# Patient Record
Sex: Female | Born: 1945
Health system: Southern US, Community
[De-identification: ages and names within clinical notes are randomized; demographics above are authoritative.]

## PROBLEM LIST (undated history)

## (undated) DIAGNOSIS — N390 Urinary tract infection, site not specified: Secondary | ICD-10-CM

## (undated) DIAGNOSIS — G8929 Other chronic pain: Secondary | ICD-10-CM

## (undated) DIAGNOSIS — D649 Anemia, unspecified: Secondary | ICD-10-CM

## (undated) DIAGNOSIS — M542 Cervicalgia: Secondary | ICD-10-CM

## (undated) DIAGNOSIS — F32A Depression, unspecified: Secondary | ICD-10-CM

## (undated) DIAGNOSIS — I10 Essential (primary) hypertension: Secondary | ICD-10-CM

## (undated) DIAGNOSIS — K219 Gastro-esophageal reflux disease without esophagitis: Secondary | ICD-10-CM

## (undated) DIAGNOSIS — M549 Dorsalgia, unspecified: Secondary | ICD-10-CM

## (undated) DIAGNOSIS — Z9889 Other specified postprocedural states: Secondary | ICD-10-CM

## (undated) DIAGNOSIS — T7840XA Allergy, unspecified, initial encounter: Secondary | ICD-10-CM

## (undated) DIAGNOSIS — H269 Unspecified cataract: Secondary | ICD-10-CM

## (undated) DIAGNOSIS — M1990 Unspecified osteoarthritis, unspecified site: Secondary | ICD-10-CM

## (undated) DIAGNOSIS — G709 Myoneural disorder, unspecified: Secondary | ICD-10-CM

## (undated) DIAGNOSIS — Z9289 Personal history of other medical treatment: Secondary | ICD-10-CM

## (undated) DIAGNOSIS — M329 Systemic lupus erythematosus, unspecified: Secondary | ICD-10-CM

## (undated) DIAGNOSIS — Z8739 Personal history of other diseases of the musculoskeletal system and connective tissue: Secondary | ICD-10-CM

## (undated) DIAGNOSIS — F419 Anxiety disorder, unspecified: Secondary | ICD-10-CM

## (undated) DIAGNOSIS — K8309 Other cholangitis: Secondary | ICD-10-CM

## (undated) DIAGNOSIS — K2270 Barrett's esophagus without dysplasia: Secondary | ICD-10-CM

## (undated) DIAGNOSIS — G4700 Insomnia, unspecified: Secondary | ICD-10-CM

## (undated) DIAGNOSIS — F329 Major depressive disorder, single episode, unspecified: Secondary | ICD-10-CM

## (undated) HISTORY — DX: Other cholangitis: K83.09

## (undated) HISTORY — PX: ABDOMINAL HYSTERECTOMY: SHX81

## (undated) HISTORY — DX: Other chronic pain: G89.29

## (undated) HISTORY — DX: Essential (primary) hypertension: I10

## (undated) HISTORY — DX: Anxiety disorder, unspecified: F41.9

## (undated) HISTORY — PX: EYE SURGERY: SHX253

## (undated) HISTORY — DX: Unspecified osteoarthritis, unspecified site: M19.90

## (undated) HISTORY — DX: Insomnia, unspecified: G47.00

## (undated) HISTORY — DX: Depression, unspecified: F32.A

## (undated) HISTORY — PX: BACK SURGERY: SHX140

## (undated) HISTORY — DX: Cervicalgia: M54.2

## (undated) HISTORY — DX: Unspecified cataract: H26.9

## (undated) HISTORY — DX: Other chronic pain: M54.9

## (undated) HISTORY — PX: SPINE SURGERY: SHX786

## (undated) HISTORY — DX: Gastro-esophageal reflux disease without esophagitis: K21.9

## (undated) HISTORY — DX: Major depressive disorder, single episode, unspecified: F32.9

## (undated) HISTORY — PX: JOINT REPLACEMENT: SHX530

## (undated) HISTORY — DX: Allergy, unspecified, initial encounter: T78.40XA

## (undated) HISTORY — DX: Other specified postprocedural states: Z98.890

## (undated) HISTORY — PX: CHOLECYSTECTOMY: SHX55

---

## 2000-02-08 HISTORY — PX: OTHER SURGICAL HISTORY: SHX169

## 2002-12-05 ENCOUNTER — Ambulatory Visit (HOSPITAL_COMMUNITY): Admission: RE | Admit: 2002-12-05 | Discharge: 2002-12-05 | Payer: Self-pay | Admitting: Family Medicine

## 2003-03-14 ENCOUNTER — Encounter: Admission: RE | Admit: 2003-03-14 | Discharge: 2003-06-12 | Payer: Self-pay | Admitting: Anesthesiology

## 2003-06-01 ENCOUNTER — Emergency Department (HOSPITAL_COMMUNITY): Admission: EM | Admit: 2003-06-01 | Discharge: 2003-06-01 | Payer: Self-pay | Admitting: Emergency Medicine

## 2003-06-22 ENCOUNTER — Inpatient Hospital Stay (HOSPITAL_COMMUNITY): Admission: EM | Admit: 2003-06-22 | Discharge: 2003-06-26 | Payer: Self-pay | Admitting: Emergency Medicine

## 2003-07-09 DIAGNOSIS — Z9889 Other specified postprocedural states: Secondary | ICD-10-CM

## 2003-07-09 HISTORY — DX: Other specified postprocedural states: Z98.890

## 2003-07-15 ENCOUNTER — Encounter
Admission: RE | Admit: 2003-07-15 | Discharge: 2003-10-13 | Payer: Self-pay | Admitting: Physical Medicine and Rehabilitation

## 2003-07-30 ENCOUNTER — Ambulatory Visit (HOSPITAL_COMMUNITY): Admission: RE | Admit: 2003-07-30 | Discharge: 2003-07-30 | Payer: Self-pay | Admitting: Internal Medicine

## 2003-10-16 ENCOUNTER — Ambulatory Visit (HOSPITAL_COMMUNITY): Admission: RE | Admit: 2003-10-16 | Discharge: 2003-10-16 | Payer: Self-pay | Admitting: Internal Medicine

## 2003-11-03 ENCOUNTER — Encounter
Admission: RE | Admit: 2003-11-03 | Discharge: 2004-02-01 | Payer: Self-pay | Admitting: Physical Medicine and Rehabilitation

## 2003-11-04 ENCOUNTER — Ambulatory Visit: Payer: Self-pay | Admitting: Anesthesiology

## 2003-12-09 ENCOUNTER — Ambulatory Visit: Payer: Self-pay | Admitting: Psychiatry

## 2003-12-18 ENCOUNTER — Ambulatory Visit: Payer: Self-pay | Admitting: Psychiatry

## 2003-12-19 ENCOUNTER — Ambulatory Visit: Payer: Self-pay | Admitting: Family Medicine

## 2004-03-05 ENCOUNTER — Ambulatory Visit: Payer: Self-pay | Admitting: Family Medicine

## 2004-06-29 ENCOUNTER — Ambulatory Visit: Payer: Self-pay | Admitting: Family Medicine

## 2004-07-08 ENCOUNTER — Ambulatory Visit: Payer: Self-pay | Admitting: Family Medicine

## 2004-09-03 ENCOUNTER — Ambulatory Visit: Payer: Self-pay | Admitting: Family Medicine

## 2004-09-09 ENCOUNTER — Ambulatory Visit: Payer: Self-pay | Admitting: Family Medicine

## 2004-09-28 ENCOUNTER — Ambulatory Visit: Payer: Self-pay | Admitting: Family Medicine

## 2004-11-15 ENCOUNTER — Ambulatory Visit (HOSPITAL_COMMUNITY): Admission: RE | Admit: 2004-11-15 | Discharge: 2004-11-15 | Payer: Self-pay | Admitting: Family Medicine

## 2004-11-19 ENCOUNTER — Ambulatory Visit: Payer: Self-pay | Admitting: Family Medicine

## 2005-02-03 ENCOUNTER — Ambulatory Visit: Payer: Self-pay | Admitting: Family Medicine

## 2005-03-24 ENCOUNTER — Ambulatory Visit: Payer: Self-pay | Admitting: Family Medicine

## 2005-05-10 ENCOUNTER — Ambulatory Visit: Payer: Self-pay | Admitting: Family Medicine

## 2005-06-02 ENCOUNTER — Ambulatory Visit: Payer: Self-pay | Admitting: Family Medicine

## 2005-06-07 ENCOUNTER — Ambulatory Visit: Payer: Self-pay | Admitting: Family Medicine

## 2005-07-08 ENCOUNTER — Ambulatory Visit: Payer: Self-pay | Admitting: Family Medicine

## 2005-08-16 ENCOUNTER — Ambulatory Visit: Payer: Self-pay | Admitting: Family Medicine

## 2005-09-07 ENCOUNTER — Ambulatory Visit: Payer: Self-pay | Admitting: Family Medicine

## 2005-10-07 ENCOUNTER — Ambulatory Visit (HOSPITAL_COMMUNITY): Admission: RE | Admit: 2005-10-07 | Discharge: 2005-10-07 | Payer: Self-pay | Admitting: Family Medicine

## 2005-11-01 ENCOUNTER — Ambulatory Visit: Payer: Self-pay | Admitting: Family Medicine

## 2005-12-01 ENCOUNTER — Ambulatory Visit (HOSPITAL_COMMUNITY): Admission: RE | Admit: 2005-12-01 | Discharge: 2005-12-01 | Payer: Self-pay | Admitting: Family Medicine

## 2005-12-08 ENCOUNTER — Ambulatory Visit: Payer: Self-pay | Admitting: Family Medicine

## 2005-12-21 ENCOUNTER — Ambulatory Visit: Payer: Self-pay | Admitting: Family Medicine

## 2006-02-07 HISTORY — PX: SHOULDER ARTHROSCOPY: SHX128

## 2006-02-09 ENCOUNTER — Ambulatory Visit: Payer: Self-pay | Admitting: Family Medicine

## 2006-02-17 ENCOUNTER — Ambulatory Visit (HOSPITAL_COMMUNITY): Admission: RE | Admit: 2006-02-17 | Discharge: 2006-02-17 | Payer: Self-pay | Admitting: Family Medicine

## 2006-03-01 ENCOUNTER — Encounter: Payer: Self-pay | Admitting: Family Medicine

## 2006-03-01 LAB — CONVERTED CEMR LAB
BUN: 24 mg/dL — ABNORMAL HIGH (ref 6–23)
CO2: 29 meq/L (ref 19–32)
Calcium: 9.3 mg/dL (ref 8.4–10.5)
Eosinophils Absolute: 0.1 10*3/uL (ref 0.0–0.7)
Glucose, Bld: 86 mg/dL (ref 70–99)
Lymphs Abs: 1.9 10*3/uL (ref 0.7–3.3)
MCHC: 32.7 g/dL (ref 30.0–36.0)
MCV: 95.7 fL (ref 78.0–100.0)
Monocytes Absolute: 0.5 10*3/uL (ref 0.2–0.7)
Neutro Abs: 3.5 10*3/uL (ref 1.7–7.7)
Neutrophils Relative %: 59 % (ref 43–77)
RBC: 4.41 M/uL (ref 3.87–5.11)
WBC: 6 10*3/uL (ref 4.0–10.5)

## 2006-03-16 ENCOUNTER — Ambulatory Visit: Payer: Self-pay | Admitting: Family Medicine

## 2006-03-16 ENCOUNTER — Encounter: Payer: Self-pay | Admitting: Family Medicine

## 2006-03-16 ENCOUNTER — Other Ambulatory Visit: Admission: RE | Admit: 2006-03-16 | Discharge: 2006-03-16 | Payer: Self-pay | Admitting: Family Medicine

## 2006-03-16 LAB — CONVERTED CEMR LAB: Pap Smear: NORMAL

## 2006-03-21 ENCOUNTER — Ambulatory Visit (HOSPITAL_COMMUNITY): Admission: RE | Admit: 2006-03-21 | Discharge: 2006-03-21 | Payer: Self-pay | Admitting: Family Medicine

## 2006-03-24 ENCOUNTER — Encounter (HOSPITAL_COMMUNITY): Admission: RE | Admit: 2006-03-24 | Discharge: 2006-04-23 | Payer: Self-pay | Admitting: Family Medicine

## 2006-05-09 ENCOUNTER — Ambulatory Visit: Payer: Self-pay | Admitting: Family Medicine

## 2006-06-14 ENCOUNTER — Ambulatory Visit: Payer: Self-pay | Admitting: Family Medicine

## 2006-06-27 ENCOUNTER — Ambulatory Visit (HOSPITAL_COMMUNITY): Payer: Self-pay | Admitting: Psychology

## 2006-07-10 ENCOUNTER — Ambulatory Visit (HOSPITAL_COMMUNITY): Payer: Self-pay | Admitting: Psychology

## 2006-07-27 ENCOUNTER — Ambulatory Visit (HOSPITAL_BASED_OUTPATIENT_CLINIC_OR_DEPARTMENT_OTHER): Admission: RE | Admit: 2006-07-27 | Discharge: 2006-07-27 | Payer: Self-pay | Admitting: Orthopedic Surgery

## 2006-08-28 ENCOUNTER — Ambulatory Visit: Payer: Self-pay | Admitting: Family Medicine

## 2006-10-11 ENCOUNTER — Ambulatory Visit: Payer: Self-pay | Admitting: Family Medicine

## 2006-10-27 ENCOUNTER — Encounter: Payer: Self-pay | Admitting: Family Medicine

## 2006-10-27 LAB — CONVERTED CEMR LAB
BUN: 20 mg/dL (ref 6–23)
CO2: 24 meq/L (ref 19–32)
Calcium: 9.4 mg/dL (ref 8.4–10.5)
Cholesterol: 165 mg/dL (ref 0–200)
Glucose, Bld: 97 mg/dL (ref 70–99)
Potassium: 4.3 meq/L (ref 3.5–5.3)
Total CHOL/HDL Ratio: 3.2
Triglycerides: 86 mg/dL (ref <150)

## 2006-11-06 ENCOUNTER — Ambulatory Visit: Payer: Self-pay | Admitting: Family Medicine

## 2006-12-05 ENCOUNTER — Ambulatory Visit (HOSPITAL_COMMUNITY): Admission: RE | Admit: 2006-12-05 | Discharge: 2006-12-05 | Payer: Self-pay | Admitting: Family Medicine

## 2006-12-13 ENCOUNTER — Ambulatory Visit: Payer: Self-pay | Admitting: Family Medicine

## 2007-01-24 ENCOUNTER — Encounter: Payer: Self-pay | Admitting: Family Medicine

## 2007-04-12 ENCOUNTER — Ambulatory Visit: Payer: Self-pay | Admitting: Family Medicine

## 2007-05-21 ENCOUNTER — Ambulatory Visit: Payer: Self-pay | Admitting: Family Medicine

## 2007-06-22 DIAGNOSIS — M542 Cervicalgia: Secondary | ICD-10-CM | POA: Insufficient documentation

## 2007-06-22 DIAGNOSIS — F418 Other specified anxiety disorders: Secondary | ICD-10-CM | POA: Insufficient documentation

## 2007-06-22 DIAGNOSIS — G8929 Other chronic pain: Secondary | ICD-10-CM | POA: Insufficient documentation

## 2007-06-22 DIAGNOSIS — K219 Gastro-esophageal reflux disease without esophagitis: Secondary | ICD-10-CM | POA: Insufficient documentation

## 2007-06-22 DIAGNOSIS — F5105 Insomnia due to other mental disorder: Secondary | ICD-10-CM

## 2007-06-22 DIAGNOSIS — R0989 Other specified symptoms and signs involving the circulatory and respiratory systems: Secondary | ICD-10-CM | POA: Insufficient documentation

## 2007-06-22 DIAGNOSIS — M549 Dorsalgia, unspecified: Secondary | ICD-10-CM | POA: Insufficient documentation

## 2007-06-22 DIAGNOSIS — B349 Viral infection, unspecified: Secondary | ICD-10-CM | POA: Insufficient documentation

## 2007-06-22 DIAGNOSIS — I10 Essential (primary) hypertension: Secondary | ICD-10-CM | POA: Insufficient documentation

## 2007-06-22 DIAGNOSIS — F324 Major depressive disorder, single episode, in partial remission: Secondary | ICD-10-CM | POA: Insufficient documentation

## 2007-06-22 DIAGNOSIS — F32A Depression, unspecified: Secondary | ICD-10-CM | POA: Insufficient documentation

## 2007-06-22 DIAGNOSIS — B009 Herpesviral infection, unspecified: Secondary | ICD-10-CM | POA: Insufficient documentation

## 2007-06-22 DIAGNOSIS — M1990 Unspecified osteoarthritis, unspecified site: Secondary | ICD-10-CM | POA: Insufficient documentation

## 2007-07-31 ENCOUNTER — Encounter: Payer: Self-pay | Admitting: Family Medicine

## 2007-07-31 ENCOUNTER — Ambulatory Visit: Payer: Self-pay | Admitting: Family Medicine

## 2007-08-02 ENCOUNTER — Encounter: Payer: Self-pay | Admitting: Family Medicine

## 2007-08-02 LAB — CONVERTED CEMR LAB
Albumin: 4.2 g/dL (ref 3.5–5.2)
CO2: 22 meq/L (ref 19–32)
Calcium: 8.9 mg/dL (ref 8.4–10.5)
Chloride: 109 meq/L (ref 96–112)
Cholesterol: 133 mg/dL (ref 0–200)
Creatinine, Ser: 1.06 mg/dL (ref 0.40–1.20)
Eosinophils Relative: 1 % (ref 0–5)
HCT: 36 % (ref 36.0–46.0)
HDL: 38 mg/dL — ABNORMAL LOW (ref >39)
Hemoglobin: 11.9 g/dL — ABNORMAL LOW (ref 12.0–15.0)
LDL Cholesterol: 76 mg/dL (ref 0–99)
MCHC: 33.1 g/dL (ref 30.0–36.0)
MCV: 107.5 fL — ABNORMAL HIGH (ref 78.0–100.0)
Monocytes Absolute: 0.3 10*3/uL (ref 0.1–1.0)
Neutro Abs: 2.2 10*3/uL (ref 1.7–7.7)
Potassium: 3.9 meq/L (ref 3.5–5.3)
RBC: 3.35 M/uL — ABNORMAL LOW (ref 3.87–5.11)
Total CHOL/HDL Ratio: 3.5
Total Protein: 7.4 g/dL (ref 6.0–8.3)
Triglycerides: 97 mg/dL (ref <150)
VLDL: 19 mg/dL (ref 0–40)

## 2007-10-01 ENCOUNTER — Encounter: Payer: Self-pay | Admitting: Family Medicine

## 2007-10-08 ENCOUNTER — Encounter: Payer: Self-pay | Admitting: Family Medicine

## 2007-10-23 ENCOUNTER — Telehealth: Payer: Self-pay | Admitting: Family Medicine

## 2007-10-23 ENCOUNTER — Ambulatory Visit: Payer: Self-pay | Admitting: Family Medicine

## 2007-11-07 ENCOUNTER — Telehealth: Payer: Self-pay | Admitting: Family Medicine

## 2007-11-08 ENCOUNTER — Encounter: Payer: Self-pay | Admitting: Family Medicine

## 2007-11-13 ENCOUNTER — Ambulatory Visit: Payer: Self-pay | Admitting: Family Medicine

## 2007-11-14 ENCOUNTER — Encounter: Payer: Self-pay | Admitting: Family Medicine

## 2007-11-15 ENCOUNTER — Ambulatory Visit: Payer: Self-pay | Admitting: Internal Medicine

## 2007-11-16 ENCOUNTER — Ambulatory Visit (HOSPITAL_COMMUNITY): Admission: RE | Admit: 2007-11-16 | Discharge: 2007-11-16 | Payer: Self-pay | Admitting: Internal Medicine

## 2007-11-18 DIAGNOSIS — N760 Acute vaginitis: Secondary | ICD-10-CM

## 2007-11-29 ENCOUNTER — Ambulatory Visit: Payer: Self-pay | Admitting: Internal Medicine

## 2007-11-29 ENCOUNTER — Encounter: Payer: Self-pay | Admitting: Internal Medicine

## 2007-11-29 ENCOUNTER — Encounter: Payer: Self-pay | Admitting: Family Medicine

## 2007-11-29 ENCOUNTER — Ambulatory Visit (HOSPITAL_COMMUNITY): Admission: RE | Admit: 2007-11-29 | Discharge: 2007-11-29 | Payer: Self-pay | Admitting: Internal Medicine

## 2007-11-29 HISTORY — PX: ESOPHAGOGASTRODUODENOSCOPY: SHX1529

## 2007-12-06 ENCOUNTER — Ambulatory Visit (HOSPITAL_COMMUNITY): Admission: RE | Admit: 2007-12-06 | Discharge: 2007-12-06 | Payer: Self-pay | Admitting: Family Medicine

## 2007-12-10 ENCOUNTER — Encounter: Payer: Self-pay | Admitting: Family Medicine

## 2007-12-14 ENCOUNTER — Encounter: Payer: Self-pay | Admitting: Family Medicine

## 2007-12-17 ENCOUNTER — Ambulatory Visit (HOSPITAL_COMMUNITY): Admission: RE | Admit: 2007-12-17 | Discharge: 2007-12-17 | Payer: Self-pay | Admitting: Family Medicine

## 2007-12-24 ENCOUNTER — Telehealth: Payer: Self-pay | Admitting: Family Medicine

## 2007-12-26 ENCOUNTER — Ambulatory Visit: Payer: Self-pay | Admitting: Family Medicine

## 2007-12-31 ENCOUNTER — Telehealth: Payer: Self-pay | Admitting: Family Medicine

## 2008-01-23 ENCOUNTER — Ambulatory Visit: Payer: Self-pay | Admitting: Family Medicine

## 2008-03-05 ENCOUNTER — Telehealth: Payer: Self-pay | Admitting: Family Medicine

## 2008-03-06 ENCOUNTER — Ambulatory Visit: Payer: Self-pay | Admitting: Family Medicine

## 2008-03-24 ENCOUNTER — Encounter: Payer: Self-pay | Admitting: Family Medicine

## 2008-03-28 ENCOUNTER — Encounter: Payer: Self-pay | Admitting: Family Medicine

## 2008-04-29 ENCOUNTER — Encounter: Payer: Self-pay | Admitting: Family Medicine

## 2008-05-05 ENCOUNTER — Encounter: Payer: Self-pay | Admitting: Family Medicine

## 2008-05-08 ENCOUNTER — Encounter: Payer: Self-pay | Admitting: Family Medicine

## 2008-05-26 ENCOUNTER — Telehealth: Payer: Self-pay | Admitting: Family Medicine

## 2008-05-28 ENCOUNTER — Ambulatory Visit: Payer: Self-pay | Admitting: Family Medicine

## 2008-06-26 ENCOUNTER — Telehealth: Payer: Self-pay | Admitting: Family Medicine

## 2008-07-03 ENCOUNTER — Encounter: Payer: Self-pay | Admitting: Family Medicine

## 2008-07-21 ENCOUNTER — Telehealth: Payer: Self-pay | Admitting: Family Medicine

## 2008-08-08 ENCOUNTER — Encounter: Payer: Self-pay | Admitting: Family Medicine

## 2008-08-08 ENCOUNTER — Telehealth: Payer: Self-pay | Admitting: Family Medicine

## 2008-08-08 DIAGNOSIS — R5381 Other malaise: Secondary | ICD-10-CM | POA: Insufficient documentation

## 2008-08-08 DIAGNOSIS — R5383 Other fatigue: Secondary | ICD-10-CM

## 2008-08-14 ENCOUNTER — Encounter: Payer: Self-pay | Admitting: Family Medicine

## 2008-08-14 LAB — CONVERTED CEMR LAB
Basophils Absolute: 0 10*3/uL (ref 0.0–0.1)
CO2: 22 meq/L (ref 19–32)
Calcium: 9.1 mg/dL (ref 8.4–10.5)
Chloride: 109 meq/L (ref 96–112)
Cholesterol: 116 mg/dL (ref 0–200)
Eosinophils Relative: 3 % (ref 0–5)
HCT: 33.9 % — ABNORMAL LOW (ref 36.0–46.0)
Lymphs Abs: 1.3 10*3/uL (ref 0.7–4.0)
MCV: 105 fL — ABNORMAL HIGH (ref 78.0–100.0)
Monocytes Absolute: 0.6 10*3/uL (ref 0.1–1.0)
Neutrophils Relative %: 42 % — ABNORMAL LOW (ref 43–77)
Potassium: 4.7 meq/L (ref 3.5–5.3)
RBC: 3.23 M/uL — ABNORMAL LOW (ref 3.87–5.11)
RDW: 13.6 % (ref 11.5–15.5)
TSH: 3.795 microintl units/mL (ref 0.350–4.500)
Total CHOL/HDL Ratio: 2.9
VLDL: 22 mg/dL (ref 0–40)

## 2008-08-19 ENCOUNTER — Ambulatory Visit: Payer: Self-pay | Admitting: Family Medicine

## 2008-08-19 ENCOUNTER — Other Ambulatory Visit: Admission: RE | Admit: 2008-08-19 | Discharge: 2008-08-19 | Payer: Self-pay | Admitting: Family Medicine

## 2008-08-19 ENCOUNTER — Encounter: Payer: Self-pay | Admitting: Family Medicine

## 2008-08-19 DIAGNOSIS — N3000 Acute cystitis without hematuria: Secondary | ICD-10-CM | POA: Insufficient documentation

## 2008-08-19 LAB — CONVERTED CEMR LAB
Bilirubin Urine: NEGATIVE
Blood in Urine, dipstick: NEGATIVE
Ketones, urine, test strip: NEGATIVE
OCCULT 1: NEGATIVE
Specific Gravity, Urine: 1.02

## 2008-08-20 ENCOUNTER — Encounter: Payer: Self-pay | Admitting: Family Medicine

## 2008-08-20 LAB — CONVERTED CEMR LAB
Candida species: NEGATIVE
Chlamydia, DNA Probe: NEGATIVE

## 2008-09-12 ENCOUNTER — Encounter: Payer: Self-pay | Admitting: Family Medicine

## 2008-10-14 ENCOUNTER — Encounter: Payer: Self-pay | Admitting: Family Medicine

## 2008-11-10 ENCOUNTER — Telehealth: Payer: Self-pay | Admitting: Family Medicine

## 2008-11-11 ENCOUNTER — Encounter: Payer: Self-pay | Admitting: Family Medicine

## 2008-11-25 ENCOUNTER — Telehealth: Payer: Self-pay | Admitting: Family Medicine

## 2008-12-19 ENCOUNTER — Ambulatory Visit (HOSPITAL_COMMUNITY): Admission: RE | Admit: 2008-12-19 | Discharge: 2008-12-19 | Payer: Self-pay | Admitting: Family Medicine

## 2009-01-06 ENCOUNTER — Telehealth: Payer: Self-pay | Admitting: Family Medicine

## 2009-01-07 ENCOUNTER — Ambulatory Visit: Payer: Self-pay | Admitting: Family Medicine

## 2009-01-07 DIAGNOSIS — L0390 Cellulitis, unspecified: Secondary | ICD-10-CM

## 2009-01-07 DIAGNOSIS — L0291 Cutaneous abscess, unspecified: Secondary | ICD-10-CM | POA: Insufficient documentation

## 2009-01-08 ENCOUNTER — Telehealth: Payer: Self-pay | Admitting: Family Medicine

## 2009-01-14 ENCOUNTER — Encounter: Payer: Self-pay | Admitting: Family Medicine

## 2009-01-18 DIAGNOSIS — T7840XA Allergy, unspecified, initial encounter: Secondary | ICD-10-CM | POA: Insufficient documentation

## 2009-01-19 ENCOUNTER — Telehealth: Payer: Self-pay | Admitting: Family Medicine

## 2009-02-07 HISTORY — PX: WRIST SURGERY: SHX841

## 2009-02-18 ENCOUNTER — Telehealth: Payer: Self-pay | Admitting: Family Medicine

## 2009-02-24 ENCOUNTER — Telehealth: Payer: Self-pay | Admitting: Family Medicine

## 2009-04-15 ENCOUNTER — Telehealth: Payer: Self-pay | Admitting: Family Medicine

## 2009-04-16 ENCOUNTER — Ambulatory Visit: Payer: Self-pay | Admitting: Family Medicine

## 2009-04-21 ENCOUNTER — Ambulatory Visit: Payer: Self-pay | Admitting: Family Medicine

## 2009-05-19 ENCOUNTER — Telehealth: Payer: Self-pay | Admitting: Family Medicine

## 2009-05-20 ENCOUNTER — Telehealth: Payer: Self-pay | Admitting: Family Medicine

## 2009-05-25 ENCOUNTER — Telehealth (INDEPENDENT_AMBULATORY_CARE_PROVIDER_SITE_OTHER): Payer: Self-pay | Admitting: *Deleted

## 2009-05-26 ENCOUNTER — Ambulatory Visit: Payer: Self-pay | Admitting: Family Medicine

## 2009-05-26 ENCOUNTER — Ambulatory Visit (HOSPITAL_COMMUNITY): Admission: RE | Admit: 2009-05-26 | Discharge: 2009-05-26 | Payer: Self-pay | Admitting: Family Medicine

## 2009-05-27 ENCOUNTER — Encounter: Payer: Self-pay | Admitting: Family Medicine

## 2009-05-27 ENCOUNTER — Telehealth: Payer: Self-pay | Admitting: Family Medicine

## 2009-05-27 LAB — CONVERTED CEMR LAB
Alkaline Phosphatase: 93 units/L (ref 39–117)
BUN: 27 mg/dL — ABNORMAL HIGH (ref 6–23)
Basophils Relative: 0 % (ref 0–1)
CO2: 26 meq/L (ref 19–32)
Chloride: 102 meq/L (ref 96–112)
Eosinophils Relative: 1 % (ref 0–5)
HDL: 49 mg/dL (ref >39)
Hemoglobin: 11.4 g/dL — ABNORMAL LOW (ref 12.0–15.0)
Ketones, ur: NEGATIVE mg/dL
Lymphocytes Relative: 37 % (ref 12–46)
Lymphs Abs: 1 10*3/uL (ref 0.7–4.0)
MCHC: 33.5 g/dL (ref 30.0–36.0)
Monocytes Relative: 11 % (ref 3–12)
Neutro Abs: 1.4 10*3/uL — ABNORMAL LOW (ref 1.7–7.7)
Nitrite: NEGATIVE
Protein, ur: NEGATIVE mg/dL
RBC: 3.25 M/uL — ABNORMAL LOW (ref 3.87–5.11)
Retic Ct Pct: 0.7 % (ref 0.4–3.1)
Total Bilirubin: 0.5 mg/dL (ref 0.3–1.2)
Total CHOL/HDL Ratio: 2.7
Total Protein: 7.9 g/dL (ref 6.0–8.3)
Triglycerides: 102 mg/dL (ref <150)
VLDL: 20 mg/dL (ref 0–40)
Vit D, 25-Hydroxy: 31 ng/mL (ref 30–89)
WBC: 2.6 10*3/uL — ABNORMAL LOW (ref 4.0–10.5)
pH: 7 (ref 5.0–8.0)

## 2009-06-01 ENCOUNTER — Telehealth: Payer: Self-pay | Admitting: Family Medicine

## 2009-06-08 ENCOUNTER — Telehealth: Payer: Self-pay | Admitting: Family Medicine

## 2009-06-22 ENCOUNTER — Telehealth: Payer: Self-pay | Admitting: Family Medicine

## 2009-06-25 ENCOUNTER — Encounter: Payer: Self-pay | Admitting: Family Medicine

## 2009-07-14 ENCOUNTER — Telehealth: Payer: Self-pay | Admitting: Family Medicine

## 2009-07-28 ENCOUNTER — Encounter: Payer: Self-pay | Admitting: Family Medicine

## 2009-08-13 ENCOUNTER — Telehealth: Payer: Self-pay | Admitting: Family Medicine

## 2009-08-17 ENCOUNTER — Telehealth: Payer: Self-pay | Admitting: Family Medicine

## 2009-09-23 ENCOUNTER — Ambulatory Visit: Payer: Self-pay | Admitting: Family Medicine

## 2009-10-14 ENCOUNTER — Telehealth: Payer: Self-pay | Admitting: Family Medicine

## 2009-10-23 ENCOUNTER — Telehealth: Payer: Self-pay | Admitting: Family Medicine

## 2009-10-28 ENCOUNTER — Encounter: Payer: Self-pay | Admitting: Family Medicine

## 2009-10-28 ENCOUNTER — Telehealth: Payer: Self-pay | Admitting: Family Medicine

## 2009-11-24 ENCOUNTER — Ambulatory Visit: Payer: Self-pay | Admitting: Family Medicine

## 2009-11-24 DIAGNOSIS — R1900 Intra-abdominal and pelvic swelling, mass and lump, unspecified site: Secondary | ICD-10-CM

## 2009-11-24 DIAGNOSIS — R1903 Right lower quadrant abdominal swelling, mass and lump: Secondary | ICD-10-CM | POA: Insufficient documentation

## 2009-12-01 ENCOUNTER — Encounter: Payer: Self-pay | Admitting: Family Medicine

## 2009-12-02 LAB — CONVERTED CEMR LAB
CO2: 28 meq/L (ref 19–32)
Calcium: 9.3 mg/dL (ref 8.4–10.5)
Creatinine, Ser: 0.87 mg/dL (ref 0.40–1.20)
Glucose, Bld: 86 mg/dL (ref 70–99)
Potassium: 4.1 meq/L (ref 3.5–5.3)
Sodium: 138 meq/L (ref 135–145)

## 2009-12-03 ENCOUNTER — Ambulatory Visit (HOSPITAL_COMMUNITY): Admission: RE | Admit: 2009-12-03 | Discharge: 2009-12-03 | Payer: Self-pay | Admitting: Family Medicine

## 2009-12-03 ENCOUNTER — Telehealth: Payer: Self-pay | Admitting: Family Medicine

## 2009-12-25 ENCOUNTER — Ambulatory Visit (HOSPITAL_COMMUNITY): Admission: RE | Admit: 2009-12-25 | Discharge: 2009-12-25 | Payer: Self-pay | Admitting: Family Medicine

## 2010-01-05 ENCOUNTER — Telehealth: Payer: Self-pay | Admitting: Family Medicine

## 2010-01-06 ENCOUNTER — Telehealth: Payer: Self-pay | Admitting: Family Medicine

## 2010-02-24 ENCOUNTER — Ambulatory Visit
Admission: RE | Admit: 2010-02-24 | Discharge: 2010-02-24 | Payer: Self-pay | Source: Home / Self Care | Attending: Family Medicine | Admitting: Family Medicine

## 2010-02-27 ENCOUNTER — Encounter: Payer: Self-pay | Admitting: Family Medicine

## 2010-02-28 ENCOUNTER — Encounter: Payer: Self-pay | Admitting: Family Medicine

## 2010-03-09 NOTE — Assessment & Plan Note (Signed)
Summary: inj  Nurse Visit   Vital Signs:  Patient profile:   65 year old female Menstrual status:  hysterectomy Height:      65.5 inches Weight:      134.75 pounds BMI:     22.16 O2 Sat:      100 % on Room air Pulse rate:   91 / minute Resp:     16 per minute BP sitting:   180 / 100  (left arm)  Vitals Entered By: Adella Hare LPN (April 16, 2009 3:50 PM)  O2 Flow:  Room air Comments patient in for pain injections depo medrol and toradol per dr Lodema Hong   Allergies: No Known Drug Allergies  Medication Administration  Injection # 1:    Medication: Depo- Medrol 80mg     Diagnosis: BACK PAIN, CHRONIC (ICD-724.5)    Route: IM    Site: RUOQ gluteus    Exp Date: 11/11    Lot #: [id number]    Mfr: Pharmacia    Patient tolerated injection without complications    Given by: Adella Hare LPN (April 16, 2009 3:52 PM)  Injection # 2:    Medication: Ketorolac-Toradol 15mg     Diagnosis: BACK PAIN, CHRONIC (ICD-724.5)    Route: IM    Site: LUOQ gluteus    Exp Date: 09/08/2010    Lot #: [id number]    Mfr: novaplus    Comments: toradol 60mg  given    Patient tolerated injection without complications    Given by: Adella Hare LPN (April 16, 2009 3:52 PM)  Orders Added: 1)  Depo- Medrol 80mg  [J1040] 2)  Ketorolac-Toradol 15mg  [J1885] 3)  Admin of Therapeutic Inj  intramuscular or subcutaneous [96372] Pt to get d80 and t60 per dr. Lodema Hong for pain  Medication Administration  Injection # 1:    Medication: Depo- Medrol 80mg     Diagnosis: BACK PAIN, CHRONIC (ICD-724.5)    Route: IM    Site: RUOQ gluteus    Exp Date: 11/11    Lot #: [id number]    Mfr: Pharmacia    Patient tolerated injection without complications    Given by: Adella Hare LPN (April 16, 2009 3:52 PM)  Injection # 2:    Medication: Ketorolac-Toradol 15mg     Diagnosis: BACK PAIN, CHRONIC (ICD-724.5)    Route: IM    Site: LUOQ gluteus    Exp Date: 09/08/2010    Lot #: [id number]    Mfr: novaplus    Comments: toradol  60mg  given    Patient tolerated injection without complications    Given by: Adella Hare LPN (April 16, 2009 3:52 PM)  Orders Added: 1)  Depo- Medrol 80mg  [J1040] 2)  Ketorolac-Toradol 15mg  [J1885] 3)  Admin of Therapeutic Inj  intramuscular or subcutaneous [96372]   Appended Document: inj pt needs MD visit in the next week to evaluate HTN , pls let her know this and schedule appt  also she needs to bring her meds for review  Appended Document: inj COMING IN TOMORROW

## 2010-03-09 NOTE — Progress Notes (Signed)
Summary: CALL  Phone Note Call from Patient   Summary of Call: WANTS YOU TO CALL HER  ABOUT SOME SHOTS SHE RECIEVED CALL BACK AT [phone] Initial call taken by: Lind Guest,  Jun 08, 2009 1:36 PM  Follow-up for Phone Call        patient had billing question about shots given, advised they were entered as they always are, nothing has changed on our end, to call pro fee billing with any questions Follow-up by: Adella Hare LPN,  Jun 09, 8411 3:25 PM

## 2010-03-09 NOTE — Progress Notes (Signed)
Summary: speak with nurse  Phone Note Call from Patient   Summary of Call: pt needs to speak with nurse about hydroc. and leaving town. [phone] Initial call taken by: Rudene Anda,  October 23, 2009 10:46 AM  Follow-up for Phone Call        patient needs hard copy of hydrocodone, going out of town Follow-up by: Adella Hare LPN,  October 23, 2009 10:52 AM  Additional Follow-up for Phone Call Additional follow up Details #1::        she should have just collected 1 month supply, how long will she be gone, and these are generally able to be transfered bnetween states as long as she stays with thesame pharmacy. More info needed pls Additional Follow-up by: Syliva Overman MD,  October 25, 2009 8:34 PM    Additional Follow-up for Phone Call Additional follow up Details #2::    called patient, left message Follow-up by: Adella Hare LPN,  October 26, 2009 9:28 AM  Additional Follow-up for Phone Call Additional follow up Details #3:: Details for Additional Follow-up Action Taken: called patient, she has already left on her trip to [place] Additional Follow-up by: Adella Hare LPN,  October 27, 2009 8:27 AM

## 2010-03-09 NOTE — Medication Information (Signed)
Summary: Tax adviser   Imported By: Lind Guest 10/29/2009 15:51:15  _____________________________________________________________________  External Attachment:    Type:   Image     Comment:   External Document

## 2010-03-09 NOTE — Progress Notes (Signed)
Summary: MEDS  Phone Note Call from Patient   Summary of Call: NEEDS YOU TO CALL BACK ABOUT HER MEDS Initial call taken by: Lind Guest,  August 13, 2009 3:55 PM  Follow-up for Phone Call        patient wants lower dose of vicodin maybe 10/325? or even somthing that she doesnt have to come sign for Follow-up by: Adella Hare LPN,  August 14, 1608 2:53 PM  Additional Follow-up for Phone Call Additional follow up Details #1::        pls let her know the fentanyl patch is what she is signing for , however , since she has had her surgery she may not need oit and i an happy to stop it , the pain tab does not need a signature to collect Additional Follow-up by: Syliva Overman MD,  August 16, 2009 7:30 PM    Additional Follow-up for Phone Call Additional follow up Details #2::    can i refill the hydrocodone Follow-up by: Adella Hare LPN,  August 17, 2009 11:15 AM  Additional Follow-up for Phone Call Additional follow up Details #3:: Details for Additional Follow-up Action Taken: refill x 2 let her know she needs ov in sept pls Additional Follow-up by: Syliva Overman MD,  August 17, 2009 12:27 PM  Prescriptions: HYDROCODONE-ACETAMINOPHEN 10-660 MG TABS (HYDROCODONE-ACETAMINOPHEN) ONE TAB by mouth QID  #120 x 1   Entered by:   Adella Hare LPN   Authorized by:   Syliva Overman MD   Signed by:   Adella Hare LPN on 96/05/5407   Method used:   Printed then faxed to ...       [facility]  [address] [address] [id number]       [address]       [place], [place]  [address]       Ph: [phone] or [phone]       Fax: [phone]   RxID:   [id number]  rx sent in, patient has appt in aug

## 2010-03-09 NOTE — Progress Notes (Signed)
  Phone Note Call from Patient   Caller: Patient Summary of Call: patient wants doc to call her, states hernia was on left, does she have two hernia, has alot of questions Initial call taken by: Adella Hare LPN,  December 03, 2009 3:17 PM  Follow-up for Phone Call        explained the report again she has concerns that her hernia was on theleft, no mention made of left hernia in rept i explained, she will consider surgical eval at some time , not now Follow-up by: Syliva Overman MD,  December 03, 2009 5:07 PM

## 2010-03-09 NOTE — Progress Notes (Signed)
Summary: get muscle relaxer  Phone Note Call from Patient   Summary of Call: [facility] [place]. pain in knee and legs. [phone] Initial call taken by: Rudene Anda,  October 14, 2009 3:52 PM  Follow-up for Phone Call        cycobenzaprine printedpls advise her and fax to the pharmacy of her choice, also flu vac available Follow-up by: Syliva Overman MD,  October 14, 2009 4:55 PM  Additional Follow-up for Phone Call Additional follow up Details #1::        Phone Call Completed, Rx Called In Additional Follow-up by: Adella Hare LPN,  October 14, 2009 5:09 PM    New/Updated Medications: CYCLOBENZAPRINE HCL 10 MG TABS (CYCLOBENZAPRINE HCL) Take 1 tablet by mouth two times a day as needed Prescriptions: CYCLOBENZAPRINE HCL 10 MG TABS (CYCLOBENZAPRINE HCL) Take 1 tablet by mouth two times a day as needed  #60 x 3   Entered and Authorized by:   Syliva Overman MD   Signed by:   Syliva Overman MD on 10/14/2009   Method used:   Printed then faxed to ...       [facility]  [address] [address] [id number]       [address]       [place], [place]  [address]       Ph: [phone] or [phone]       Fax: [phone]   RxID:   [id number]

## 2010-03-09 NOTE — Miscellaneous (Signed)
Summary: Orders Update  Clinical Lists Changes  Orders: Added new Test order of T-Basic Metabolic Panel ([id number]) - Signed  

## 2010-03-09 NOTE — Progress Notes (Signed)
Summary: INJECTION  Phone Note Call from Patient   Summary of Call: WANTS TO KNOW CAN SHE COME IN FOR A INJECTION FOR PREDISONE  FOR PAIN IN HIP  AND A RX FOR PREDISONE OR FENT. PATCH Initial call taken by: Lind Guest,  April 15, 2009 4:04 PM  Follow-up for Phone Call        She only wants a shot. Told her she can come in at 4pm today for shot of prednisone per Dr. Lodema Hong Follow-up by: Everitt Amber LPN,  April 16, 2009 10:32 AM

## 2010-03-09 NOTE — Progress Notes (Signed)
Summary: CALL  Phone Note Call from Patient   Summary of Call: Renee Waters cancelled this morning because she can not afford three drs. and asked me would this be free i said i would not think so BUT TO TELL YOU THAT HER MEDICINE IS WORKING Initial call taken by: Lind Guest,  May 19, 2009 9:02 AM

## 2010-03-09 NOTE — Progress Notes (Signed)
Summary: message  Phone Note Call from Patient   Summary of Call: said you called her friday to see how she was doing  and to let you and dr. Lodema Hong know that she has gone no where if any ? call her back Initial call taken by: Lind Guest,  Jun 22, 2009 2:47 PM  Follow-up for Phone Call        pls call and directly spk with her the call is regarding her surgery, just checking to see if recvering well Follow-up by: Syliva Overman MD,  Jun 22, 2009 4:25 PM  Additional Follow-up for Phone Call Additional follow up Details #1::        patient hasnt had the surgery per husband Additional Follow-up by: Adella Hare LPN,  Jun 23, 2009 11:34 AM    Additional Follow-up for Phone Call Additional follow up Details #2::    noted Follow-up by: Syliva Overman MD,  Jun 23, 2009 2:51 PM

## 2010-03-09 NOTE — Assessment & Plan Note (Signed)
Summary: f up   Vital Signs:  Patient profile:   65 year old female Menstrual status:  hysterectomy Height:      65 inches Weight:      124.75 pounds BMI:     20.83 O2 Sat:      94 % Pulse rate:   73 / minute Pulse rhythm:   regular Resp:     16 per minute BP sitting:   158 / 90  (left arm) Cuff size:   regular  Vitals Entered By: Everitt Amber LPN (September 23, 2009 1:02 PM) CC: Follow up chronic problems, wants pain meds changed and muscle relaxer.    Primary Care Provider:  Syliva Overman MD  CC:  Follow up chronic problems and wants pain meds changed and muscle relaxer. Marland Kitchen  History of Present Illness: Hospitalised June 14 to 22 for right hip replacement, there were reportedly some post op complications, however at [facility] time Renee Waters is satisfied with her  Reports  that she has optherwise not been doing well as far as her mntal health is concerned. Denies recent fever or chills. Denies sinus pressure, nasal congestion , ear pain or sore throat. Denies chest congestion, or cough productive of sputum. Denies chest pain, palpitations, PND, orthopnea or leg swelling. Denies abdominal pain, nausea, vomitting, diarrhea or constipation. Denies change in bowel movements or bloody stool. Denies dysuria , frequency, incontinence or hesitancy.  Denies headaches, vertigo, seizures.  Denies  rash, lesions, or itch.     Current Medications (verified): 1)  Gabapentin 300 Mg  Caps (Gabapentin) .... Take 1 Tablet By Mouth Three Times A Day 2)  Multivitamins   Tabs (Multiple Vitamin) .... Take 1 Tablet By Mouth Once A Day 3)  Temazepam 30 Mg  Caps (Temazepam) .... Take 1 Tab By Mouth At Bedtime 4)  Skelaxin 800 Mg Tabs (Metaxalone) .... Take 1 Tablet By Mouth Three Times A Day 5)  Hydrocodone-Acetaminophen 10-660 Mg Tabs (Hydrocodone-Acetaminophen) .... One Tab By Mouth Qid 6)  Alprazolam 0.5 Mg Tabs (Alprazolam) .... Take 1 Tab By Mouth At Bedtime 7)  Citalopram Hydrobromide 20 Mg Tabs  (Citalopram Hydrobromide) .... Take 1 Tablet By Mouth Once A Day 8)  Red Yeast Rice 600 Mg Caps (Red Yeast Rice Extract) .... Take 1 Tablet By Mouth Two Times A Day 9)  Q-10 Co-Enzyme 30 Mg Caps (Coenzyme Q10) .... Take 1 Tablet By Mouth Once A Day 10)  Premarin 0.625 Mg/gm Crea (Estrogens, Conjugated) .... Apply Sparingly Three Times Weekly Prn 11)  Clonidine Hcl 0.3 Mg Tabs (Clonidine Hcl) .... One Half in The Morning and One At Bedtime 12)  Ferrous Sulfate 325 (65 Fe) Mg Tabs (Ferrous Sulfate) .... Take 1 Tablet By Mouth Two Times A Day 13)  Aciphex 20 Mg Tbec (Rabeprazole Sodium) .... Take 1 Tablet By Mouth Once A Day  Allergies (verified): No Known Drug Allergies  Past History:  Past medical, surgical, family and social histories (including risk factors) reviewed, and no changes noted (except as noted below).  Past Medical History: Reviewed history from 01/24/2007 and no changes required.  INSOMNIA (ICD-780.52) BACK PAIN, CHRONIC (ICD-724.5) NECK PAIN, CHRONIC (ICD-723.1) DEPRESSION (ICD-311) GERD (ICD-530.81) OSTEOARTHRITIS, SEVERE (ICD-715.90) GENITAL HERPES (ICD-054.10) HYPERTENSION (ICD-401.9)  Past Surgical History: cervicle diskectomy 2002 total hysterectomy 1995 arthroscopy left shoulder 2008 Right hip replacement July 21, 2009  Family History: Reviewed history from 01/24/2007 and no changes required. MOTHER  78 HTN FATHER UNKNOWN SISTER  X  1 BROTHERS  0  Social  History: Reviewed history from 01/24/2007 and no changes required. EMPLOYED Married Former Smoker Alcohol use-no Drug use-no 4 CHILDREN  Review of Systems      See HPI General:  Complains of fatigue and sleep disorder. Eyes:  Denies discharge and red eye. Renee:  Complains of joint pain, low back pain, muscle aches, and thoracic pain. Psych:  Complains of depression, easily tearful, irritability, and mental problems; denies suicidal thoughts/plans, thoughts of violence, and unusual visions or  sounds; still experiencing significant depression asshe mourns the loss of her mother 7 yrs ago, has guilt around the fact that she was not physicLLY beside her when she died , aND BECAUSE SHE HAD SAID TO HER THAT SHE WAS TIRED .  Physical Exam  General:  Well-developed,well-nourished,in no acute distress; alert,appropriate and cooperative throughout examination HEENT: No facial asymmetry,  EOMI, No sinus tenderness, TM's Clear, oropharynx  pink and moist.   Chest: Clear to auscultation bilaterally.  CVS: S1, S2, No murmurs, No S3.   Abd: Soft, Nontender.  MSdecreased  ROM spine,and  in  hips,adequate in  shoulders and knees.  Ext: No edema.   CNS: CN 2-12 intact, power tone and sensation normal throughout.   Skin: Intact, no visible lesions or rashes.  Psych: Good eye contact, Tearful.  Memory intact, depressed appearing.    Impression & Recommendations:  Problem # 1:  BACK PAIN, CHRONIC (ICD-724.5) Assessment Unchanged  The following medications were removed from the medication list:    Hydrocodone-acetaminophen 10-660 Mg Tabs (Hydrocodone-acetaminophen) ..... One tab by mouth qid    Fentanyl 25 Mcg/hr Pt72 (Fentanyl) .Marland Kitchen... Apply one patch every 3 days Her updated medication list for this problem includes:    Skelaxin 800 Mg Tabs (Metaxalone) .Marland Kitchen... Take 1 tablet by mouth three times a day    Hydrocodone-acetaminophen 10-325 Mg Tabs (Hydrocodone-acetaminophen) .Marland Kitchen... Take 1 tablet by mouth four times a day  Problem # 2:  HYPERTENSION (ICD-401.9) Assessment: Deteriorated  The following medications were removed from the medication list:    Benicar Hct 40-25 Mg Tabs (Olmesartan medoxomil-hctz) .Marland Kitchen... Take 1 tablet by mouth once a day    Clonidine Hcl 0.3 Mg Tabs (Clonidine hcl) ..... One half in the morning and one at bedtime Her updated medication list for this problem includes:    Clonidine Hcl 0.3 Mg Tabs (Clonidine hcl) ..... One tablet twice daily  BP today: 158/90 Prior BP:  104/70 (05/26/2009)  Labs Reviewed: K+: 4.0 (05/26/2009) Creat: : 0.91 (05/26/2009)   Chol: 134 (05/26/2009)   HDL: 49 (05/26/2009)   LDL: 65 (05/26/2009)   TG: 102 (05/26/2009)  Problem # 3:  DEPRESSION (ICD-311) Assessment: Deteriorated  Her updated medication list for this problem includes:    Alprazolam 0.5 Mg Tabs (Alprazolam) .Marland Kitchen... Take 1 tab by mouth at bedtime    Citalopram Hydrobromide 20 Mg Tabs (Citalopram hydrobromide) .Marland Kitchen... Take 1 tablet by mouth once a day  Problem # 4:  INSOMNIA (ICD-780.52) Assessment: Improved  Her updated medication list for this problem includes:    Temazepam 30 Mg Caps (Temazepam) .Marland Kitchen... Take 1 tab by mouth at bedtime  Discussed sleep hygiene.   Complete Medication List: 1)  Gabapentin 300 Mg Caps (Gabapentin) .... Take 1 tablet by mouth three times a day 2)  Multivitamins Tabs (Multiple vitamin) .... Take 1 tablet by mouth once a day 3)  Temazepam 30 Mg Caps (Temazepam) .... Take 1 tab by mouth at bedtime 4)  Skelaxin 800 Mg Tabs (Metaxalone) .... Take 1 tablet by  mouth three times a day 5)  Alprazolam 0.5 Mg Tabs (Alprazolam) .... Take 1 tab by mouth at bedtime 6)  Citalopram Hydrobromide 20 Mg Tabs (Citalopram hydrobromide) .... Take 1 tablet by mouth once a day 7)  Red Yeast Rice 600 Mg Caps (Red yeast rice extract) .... Take 1 tablet by mouth two times a day 8)  Q-10 Co-enzyme 30 Mg Caps (Coenzyme q10) .... Take 1 tablet by mouth once a day 9)  Premarin 0.625 Mg/gm Crea (Estrogens, conjugated) .... Apply sparingly three times weekly prn 10)  Ferrous Sulfate 325 (65 Fe) Mg Tabs (Ferrous sulfate) .... Take 1 tablet by mouth two times a day 11)  Aciphex 20 Mg Tbec (Rabeprazole sodium) .... Take 1 tablet by mouth once a day 12)  Clonidine Hcl 0.3 Mg Tabs (Clonidine hcl) .... One tablet twice daily 13)  Hydrocodone-acetaminophen 10-325 Mg Tabs (Hydrocodone-acetaminophen) .... Take 1 tablet by mouth four times a day  Patient Instructions: 1)   Please schedule a follow-up appointment in 2 months. 2)  New dose of clonidine , increased to one twice daily, your BP is too high. 3)  Stop the benicar as you have done Prescriptions: CITALOPRAM HYDROBROMIDE 20 MG TABS (CITALOPRAM HYDROBROMIDE) Take 1 tablet by mouth once a day  #90 x 1   Entered by:   Everitt Amber LPN   Authorized by:   Syliva Overman MD   Signed by:   Everitt Amber LPN on 16/11/9602   Method used:   Printed then faxed to ...       [facility] [address] [id number]       [address]       [place], [place]  [address]       Ph: [phone] or [phone]       Fax: [phone]   RxID:   [id number] ALPRAZOLAM 0.5 MG TABS (ALPRAZOLAM) Take 1 tab by mouth at bedtime  #30 x 3   Entered by:   Everitt Amber LPN   Authorized by:   Syliva Overman MD   Signed by:   Everitt Amber LPN on 40/34/7425   Method used:   Printed then faxed to ...       [facility] [address] [address] [id number]       [address]       [place], [place]  [address]       Ph: [phone] or [phone]       Fax: [phone]   RxID:   [id number] TEMAZEPAM 30 MG  CAPS (TEMAZEPAM) Take 1 tab by mouth at bedtime  #30 x 3   Entered by:   Everitt Amber LPN   Authorized by:   Syliva Overman MD   Signed by:   Everitt Amber LPN on 62/83/1517   Method used:   Printed then faxed to ...       [facility] [address] [id number]       [address]       [place], [place]  [address]       Ph: [phone] or [phone]       Fax: [phone]   RxID:   [id number] GABAPENTIN 300 MG  CAPS (GABAPENTIN) Take 1 tablet by mouth three times a day  #90 Capsule x 1   Entered by:  Everitt Amber LPN   Authorized by:   Syliva Overman MD   Signed by:   Everitt Amber LPN on 16/11/9602   Method used:   Printed then faxed to ...       [facility] [address] [id number]       [address]       [place], [place]  [address]        Ph: [phone] or [phone]       Fax: [phone]   RxID:   [id number] HYDROCODONE-ACETAMINOPHEN 10-325 MG TABS (HYDROCODONE-ACETAMINOPHEN) Take 1 tablet by mouth four times a day  #120 x 2   Entered and Authorized by:   Syliva Overman MD   Signed by:   Syliva Overman MD on 09/23/2009   Method used:   Printed then faxed to ...       [facility] [address] [id number]       [address]       [place], [place]  [address]       Ph: [phone] or [phone]       Fax: [phone]   RxID:   [id number] CLONIDINE HCL 0.3 MG TABS (CLONIDINE HCL) one tablet twice daily  #60 x 2   Entered and Authorized by:   Syliva Overman MD   Signed by:   Syliva Overman MD on 09/23/2009   Method used:   Electronically to        [facility] [address] [id number]       [address]       [place], [place]  [address]       Ph: [phone] or [phone]       Fax: [phone]   RxID:   [id number]

## 2010-03-09 NOTE — Progress Notes (Signed)
Summary: refill  Phone Note Call from Patient   Summary of Call: needs to get a refill on vicidone. [facility] [place] [phone] Initial call taken by: Rudene Anda,  February 18, 2009 11:48 AM  Follow-up for Phone Call        Phone Call Completed, Rx Called In Follow-up by: Worthy Keeler LPN,  February 18, 2009 11:50 AM    Prescriptions: HYDROCODONE-ACETAMINOPHEN 10-660 MG TABS (HYDROCODONE-ACETAMINOPHEN) ONE TAB by mouth QID  #120 x 2   Entered by:   Worthy Keeler LPN   Authorized by:   Syliva Overman MD   Signed by:   Worthy Keeler LPN on 45/40/9811   Method used:   Printed then faxed to ...       [facility] [id number]       [address]       [place], [place]  [address]       Ph: [phone] or [phone]       Fax: [phone]   RxID:   [id number]

## 2010-03-09 NOTE — Progress Notes (Signed)
Summary: MED  Phone Note Call from Patient   Summary of Call: NEEDS FOR YOU TO CALL HER BEFORE LUNCH TO LET HER KNOW SOMETHING Initial call taken by: Lind Guest,  August 17, 2009 8:35 AM  Follow-up for Phone Call        med sent Follow-up by: Adella Hare LPN,  August 17, 2009 1:32 PM

## 2010-03-09 NOTE — Assessment & Plan Note (Signed)
Summary: office visit   Vital Signs:  Patient profile:   65 year old female Menstrual status:  hysterectomy Height:      65 inches Weight:      128.75 pounds O2 Sat:      97 % on Room air Pulse rate:   85 / minute Resp:     16 per minute BP sitting:   120 / 70  (right arm) Cuff size:   regular  Vitals Entered By: Mauricia Area CMA (November 24, 2009 1:13 PM)  O2 Flow:  Room air CC: follow up. Fell, hip is bothering her, back and neck. Hands get numb when she uses them   Primary Care Trey Gulbranson:  Syliva Overman MD  CC:  follow up. Fell, hip is bothering her, and back and neck. Hands get numb when she uses them.  History of Present Illness: Pt c/o 2 year h/o pain and swelling on the right lower abdomen associated wirth soreness, states she had [facility] scans were done which were unrelieving. No change in the extent of swelling. states tried to get gynawe appt , coming up mid November Pt reports uncontrolled pain requests change in pain management to improve control.Requiests anti-inflammatory, and a change in a muscle relaxant to one which has helped in the past but is nopt preferred.  Current Medications (verified): 1)  Gabapentin 300 Mg  Caps (Gabapentin) .... Take 1 Tablet By Mouth Three Times A Day 2)  Temazepam 30 Mg  Caps (Temazepam) .... Take 1 Tab By Mouth At Bedtime 3)  Alprazolam 0.5 Mg Tabs (Alprazolam) .... Take 1 Tab By Mouth At Bedtime 4)  Citalopram Hydrobromide 20 Mg Tabs (Citalopram Hydrobromide) .... Take 1 Tablet By Mouth As Needed 5)  Premarin 0.625 Mg/gm Crea (Estrogens, Conjugated) .... Apply Sparingly Three Times Weekly Prn 6)  Aciphex 20 Mg Tbec (Rabeprazole Sodium) .... Take 1 Tablet By Mouth As Needed 7)  Clonidine Hcl 0.3 Mg Tabs (Clonidine Hcl) .... One Tablet Twice Daily 8)  Hydrocodone-Acetaminophen 10-325 Mg Tabs (Hydrocodone-Acetaminophen) .... Take 1 Tablet By Mouth Four Times A Day 9)  Cyclobenzaprine  Hcl 10 Mg Tabs (Cyclobenzaprine Hcl) .... Take 1 Tablet By Mouth Three Times A Day As Needed 10)  Acyclovir 800 Mg Tabs (Acyclovir) .Marland Kitchen.. 1 Tab Fuve Times A Day For Sevein Days  Allergies (verified): No Known Drug Allergies  Review of Systems      See HPI General:  Complains of fatigue, malaise, and sleep disorder. Eyes:  Complains of vision loss-both eyes; denies discharge and red eye; wears corrective lenses. ENT:  Denies hoarseness, nasal congestion, sinus pressure, and sore throat. CV:  Denies chest pain or discomfort, palpitations, and swelling of feet. Resp:  Denies cough and sputum productive. GI:  Complains of abdominal pain; denies constipation, diarrhea, nausea, and vomiting blood. GU:  Denies dysuria and urinary frequency. MS:  Complains of joint pain; neck pain radiating down hands since past 5 days ago, when she fell, also right hip pain increased hip pain since that time. Derm:  Complains of lesion(s); positive HSV2, noit currently active. Neuro:  Denies headaches, seizures, and sensation of room spinning. Psych:  Complains of anxiety, depression, and irritability; denies mental problems, suicidal thoughts/plans, thoughts of violence, and unusual visions or sounds. Endo:  Denies cold intolerance, excessive thirst, excessive urination, and heat intolerance. Heme:  Denies abnormal bruising and bleeding. Allergy:  Denies hives or rash and itching eyes.  Physical Exam  General:  Well-developed,well-nourished,in no  acute distress; alert,appropriate and cooperative throughout examination HEENT: No facial asymmetry,  EOMI, No sinus tenderness, TM's Clear, oropharynx  pink and moist.   Chest: Clear to auscultation bilaterally.  CVS: S1, S2, No murmurs, No S3.   Abd: Soft,right lower quadrant tenderness, no guarding or rbound, possible rLQ mass MS: decreased  ROM spine,and  hips, adequate in shoulders and knees.  Ext: No edema.   CNS: CN 2-12 intact, power tone and sensation  normal throughout.   Skin: Intact, no visible lesions or rashes.  Psych: Good eye contact, normal affect.  Memory intact, mildly anxious, not  depressed appearing.    Impression & Recommendations:  Problem # 1:  ABDOMINAL/PELVIC SWELLING MASS/LUMP UNSPEC SITE (ICD-789.30) Assessment Deteriorated  Orders: Radiology Referral (Radiology)  Problem # 2:  BACK PAIN, CHRONIC (ICD-724.5) Assessment: Deteriorated  The following medications were removed from the medication list:    Cyclobenzaprine Hcl 10 Mg Tabs (Cyclobenzaprine hcl) .Marland Kitchen... Take 1 tablet by mouth three times a day as needed Her updated medication list for this problem includes:    Hydrocodone-acetaminophen 10-325 Mg Tabs (Hydrocodone-acetaminophen) .Marland Kitchen... Take 1 tablet by mouth four times a day    Metaxalone 800 Mg Tabs (Metaxalone) .Marland Kitchen... Take 1 tablet by mouth three times a day as needed for uncontrolled muscle spasm    Vimovo 500-20 Mg Tbec (Naproxen-esomeprazole) .Marland Kitchen... Take 1 tablet by mouth two times a day for 7 days, then 1 twice daily as needed  Orders: Depo- Medrol 80mg  (J1040) Ketorolac-Toradol 15mg  (Z6109) Admin of Therapeutic Inj  intramuscular or subcutaneous (60454)  Problem # 3:  INSOMNIA (ICD-780.52) Assessment: Unchanged  Her updated medication list for this problem includes:    Temazepam 30 Mg Caps (Temazepam) .Marland Kitchen... Take 1 tab by mouth at bedtime  Discussed sleep hygiene.   Problem # 4:  DEPRESSION (ICD-311) Assessment: Improved  Her updated medication list for this problem includes:    Alprazolam 0.5 Mg Tabs (Alprazolam) .Marland Kitchen... Take 1 tab by mouth at bedtime    Citalopram Hydrobromide 20 Mg Tabs (Citalopram hydrobromide) .Marland Kitchen... Take 1 tablet by mouth as needed  Problem # 5:  HYPERTENSION (ICD-401.9) Assessment: Improved  Her updated medication list for this problem includes:    Clonidine Hcl 0.3 Mg Tabs (Clonidine hcl) ..... One tablet twice daily  BP today: 120/70 Prior BP: 158/90  (09/23/2009)  Labs Reviewed: K+: 4.0 (05/26/2009) Creat: : 0.91 (05/26/2009)   Chol: 134 (05/26/2009)   HDL: 49 (05/26/2009)   LDL: 65 (05/26/2009)   TG: 102 (05/26/2009)  Problem # 6:  GENITAL HERPES (ICD-054.10) Assessment: Unchanged  Complete Medication List: 1)  Temazepam 30 Mg Caps (Temazepam) .... Take 1 tab by mouth at bedtime 2)  Alprazolam 0.5 Mg Tabs (Alprazolam) .... Take 1 tab by mouth at bedtime 3)  Citalopram Hydrobromide 20 Mg Tabs (Citalopram hydrobromide) .... Take 1 tablet by mouth as needed 4)  Premarin 0.625 Mg/gm Crea (Estrogens, conjugated) .... Apply sparingly three times weekly prn 5)  Aciphex 20 Mg Tbec (Rabeprazole sodium) .... Take 1 tablet by mouth as needed 6)  Clonidine Hcl 0.3 Mg Tabs (Clonidine hcl) .... One tablet twice daily 7)  Hydrocodone-acetaminophen 10-325 Mg Tabs (Hydrocodone-acetaminophen) .... Take 1 tablet by mouth four times a day 8)  Acyclovir 800 Mg Tabs (Acyclovir) .Marland Kitchen.. 1 tab fuve times a day for sevein days 9)  Metaxalone 800 Mg Tabs (Metaxalone) .... Take 1 tablet by mouth three times a day as needed for uncontrolled muscle spasm 10)  Gabapentin 300 Mg Caps (  Gabapentin) .... Two capsules 3 times daily 11)  Prednisone (pak) 5 Mg Tabs (Prednisone) .... Use as directed 12)  Vimovo 500-20 Mg Tbec (Naproxen-esomeprazole) .... Take 1 tablet by mouth two times a day for 7 days, then 1 twice daily as needed  Other Orders: Influenza Vaccine NON MCR (57846)  Patient Instructions: 1)  Please schedule a follow-up appointment in 3 months. 2)  you are being referred for scan of your abdomen and pelvis. 3)  you will get injections today also your flu shot. 4)  med changes as discussed. 5)  The medication list was reviewed and reconciled..All changed/newly prescribed medications were explained. A complete medication list was provided to the patient/caregiver.  Prescriptions: VIMOVO 500-20 MG TBEC (NAPROXEN-ESOMEPRAZOLE) Take 1 tablet by mouth two  times a day for 7 days, then 1 twice daily as needed  #60 x 0   Entered and Authorized by:   Syliva Overman MD   Signed by:   Syliva Overman MD on 11/24/2009   Method used:   Printed then faxed to ...       [facility]  [address] [address] [id number]retail[facility]       [address]       [place], [place]  [address]       Ph: [phone] or [phone]       Fax: [phone]   RxID:   [id number] PREDNISONE (PAK) 5 MG TABS (PREDNISONE) Use as directed  #21 x 0   Entered and Authorized by:   Syliva Overman MD   Signed by:   Syliva Overman MD on 11/24/2009   Method used:   Printed then faxed to ...       [facility]  [address] [address] [id number]ail[facility]       [address]       [place], [place]  [address]       Ph: [phone] or [phone]       Fax: [phone]   RxID:   [id number] GABAPENTIN 300 MG CAPS (GABAPENTIN) two capsules 3 times daily  #180 x 2   Entered and Authorized by:   Syliva Overman MD   Signed by:   Syliva Overman MD on 11/24/2009   Method used:   Printed then faxed to ...       [facility]  [address] [address] [id number]       [address]       [place], [place]  [address]       Ph: [phone] or [phone]       Fax: [phone]   RxID:   [id number] METAXALONE 800 MG TABS (METAXALONE) Take 1 tablet by mouth three times a day as needed for uncontrolled muscle spasm  #90 x 0   Entered and Authorized by:   Syliva Overman MD   Signed by:   Syliva Overman MD on 11/24/2009   Method used:   Printed then faxed to ...       [facility]  [address] [address] [id number]       [address]       [place], [place]  [address]       Ph: [phone] or [phone]       Fax: [phone]   RxID:   [id number]  Medication Administration  Injection # 1:    Medication: Depo- Medrol 80mg     Diagnosis: BACK PAIN, CHRONIC (ICD-724.5)    Route: IM    Site: RUOQ gluteus    Exp Date: 06/12    Lot #:  [id number]    Mfr: Pharmacia    Patient tolerated injection without complications    Given by: Adella Hare LPN (November 24, 2009 3:10 PM)  Injection # 2:    Medication: Ketorolac-Toradol 15mg     Diagnosis: BACK PAIN, CHRONIC (ICD-724.5)    Route: IM    Site: LUOQ gluteus    Exp Date: 12/09/2010    Lot #: [id number]    Mfr: novaplus    Comments: toradol 60mg  given    Patient tolerated injection without complications    Given by: Adella Hare LPN (November 24, 2009 3:11 PM)  Orders Added: 1)  Radiology Referral [Radiology] 2)  Radiology Referral [Radiology] 3)  Est. Patient Level IV [78469] 4)  Influenza Vaccine NON MCR [00028] 5)  Depo- Medrol 80mg  [J1040] 6)  Ketorolac-Toradol 15mg  [J1885] 7)  Admin of Therapeutic Inj  intramuscular or subcutaneous [[address]]   Immunizations Administered:  Influenza Vaccine # 1:    Vaccine Type: Fluvax Non-MCR    Site: right deltoid    Mfr: novartis    Dose: 0.5 ml    Route: IM    Given by: Adella Hare LPN    Exp. Date: 06/2010    Lot #: [id number]    VIS given: 08/31/06 version given November 24, 2009.   Immunizations Administered:  Influenza Vaccine # 1:    Vaccine Type: Fluvax Non-MCR    Site: right deltoid    Mfr: novartis    Dose: 0.5 ml    Route: IM    Given by: Adella Hare LPN    Exp. Date: 06/2010    Lot #: [id number]    VIS given: 08/31/06 version given November 24, 2009.    Medication Administration  Injection # 1:    Medication: Depo- Medrol 80mg     Diagnosis: BACK PAIN, CHRONIC (ICD-724.5)    Route: IM    Site: RUOQ gluteus    Exp Date: 06/12    Lot #: [id number]    Mfr: Pharmacia    Patient tolerated injection without complications    Given by: Adella Hare LPN (November 24, 2009 3:10 PM)  Injection # 2:    Medication: Ketorolac-Toradol 15mg     Diagnosis: BACK PAIN, CHRONIC (ICD-724.5)    Route: IM    Site: LUOQ gluteus    Exp Date: 12/09/2010    Lot #: [id number]    Mfr: novaplus    Comments: toradol 60mg  given     Patient tolerated injection without complications    Given by: Adella Hare LPN (November 24, 2009 3:11 PM)  Orders Added: 1)  Radiology Referral [Radiology] 2)  Radiology Referral [Radiology] 3)  Est. Patient Level IV [13244] 4)  Influenza Vaccine NON MCR [00028] 5)  Depo- Medrol 80mg  [J1040] 6)  Ketorolac-Toradol 15mg  [J1885] 7)  Admin of Therapeutic Inj  intramuscular or subcutaneous [01027]

## 2010-03-09 NOTE — Progress Notes (Signed)
Summary: phone number   Phone Note Call from Patient   Summary of Call: pt wants dr. Lodema Hong to know dr mortinsons number   fax  [phone]   phone [phone] Initial call taken by: Rudene Anda,  May 27, 2009 10:39 AM  Follow-up for Phone Call        noted Follow-up by: Syliva Overman MD,  May 27, 2009 12:01 PM

## 2010-03-09 NOTE — Assessment & Plan Note (Signed)
Summary: OV   Vital Signs:  Patient profile:   65 year old female Menstrual status:  hysterectomy Height:      65 inches Weight:      128 pounds BMI:     21.38 O2 Sat:      95 % Pulse rate:   87 / minute Pulse rhythm:   regular Resp:     16 per minute BP sitting:   104 / 70  (left arm) Cuff size:   regular  Vitals Entered By: Everitt Amber LPN (May 26, 2009 1:16 PM) CC: Follow up chronic problems   Primary Care Provider:  Syliva Overman MD  CC:  Follow up chronic problems.  History of Present Illness: Pt here for pre op evaliuation for right hip surgery asap. She reports good response to fentanyl ptch, but is anxious to have the surgery to improve her level of functioning. Gaynelle Adu denies any other symptoms, specifically, cardiovascular, respiratory or urinaery. Her major complaint is of back and joint pain.  Current Medications (verified): 1)  Gabapentin 300 Mg  Caps (Gabapentin) .... Take 1 Tablet By Mouth Three Times A Day 2)  Multivitamins   Tabs (Multiple Vitamin) .... Take 1 Tablet By Mouth Once A Day 3)  Temazepam 30 Mg  Caps (Temazepam) .... Take 1 Tab By Mouth At Bedtime 4)  Clonidine Hcl 0.3 Mg Tabs (Clonidine Hcl) .... Take 1 Tablet By Mouth Two Times A Day 5)  Skelaxin 800 Mg Tabs (Metaxalone) .... Take 1 Tablet By Mouth Three Times A Day 6)  Hydrocodone-Acetaminophen 10-660 Mg Tabs (Hydrocodone-Acetaminophen) .... One Tab By Mouth Qid 7)  Benicar Hct 40-25 Mg Tabs (Olmesartan Medoxomil-Hctz) .... Take 1 Tablet By Mouth Once A Day 8)  Alprazolam 0.5 Mg Tabs (Alprazolam) .... Take 1 Tab By Mouth At Bedtime 9)  Citalopram Hydrobromide 20 Mg Tabs (Citalopram Hydrobromide) .... Take 1 Tablet By Mouth Once A Day 10)  Red Yeast Rice 600 Mg Caps (Red Yeast Rice Extract) .... Take 1 Tablet By Mouth Two Times A Day 11)  Q-10 Co-Enzyme 30 Mg Caps (Coenzyme Q10) .... Take 1 Tablet By Mouth Once A Day 12)  Premarin 0.625 Mg/gm Crea (Estrogens, Conjugated) .... Apply Sparingly  Three Times Weekly Prn 13)  Fentanyl 25 Mcg/hr Pt72 (Fentanyl) .... Apply One Patch Every 3 Days  Allergies (verified): No Known Drug Allergies  Review of Systems      See HPI General:  Complains of fatigue and sleep disorder; denies chills and fever. Eyes:  Denies discharge, eye pain, and red eye. ENT:  Denies earache, hoarseness, nasal congestion, sinus pressure, and sore throat. CV:  Denies chest pain or discomfort, palpitations, and swelling of feet. Resp:  Denies cough and sputum productive. GI:  Denies abdominal pain, constipation, diarrhea, nausea, and vomiting. GU:  Complains of urinary frequency; denies dysuria; will do ccua to r/o acuter cystitis and for preop clearance. MS:  See HPI; Complains of joint pain and stiffness; right hip pain and stiffness has upcoming surgery in [place]. Psych:  Complains of anxiety and depression; denies sense of great danger, suicidal thoughts/plans, thoughts of violence, and unusual visions or sounds; controlled on meds. Endo:  Denies cold intolerance, excessive hunger, excessive thirst, excessive urination, heat intolerance, polyuria, and weight change. Heme:  Denies abnormal bruising and bleeding. Allergy:  Denies hives or rash and itching eyes.  Physical Exam  General:  Well-developed,well-nourished,in no acute distress; alert,appropriate and cooperative throughout examinationPt in obviopus pain. HEENT: No facial asymmetry,  EOMI, No  sinus tenderness, TM's Clear, oropharynx  pink and moist.   Chest: Clear to auscultation bilaterally.  CVS: S1, S2, No murmurs, No S3.   Abd: Soft, Nontender.  MSdecreased  ROM spine,and  in  hips,adequate in  shoulders and knees.  Ext: No edema.   CNS: CN 2-12 intact, power tone and sensation normal throughout.   Skin: Intact, no visible lesions or rashes.  Psych: Good eye contact, normal affect.  Memory intact, depressed appearing.    Impression & Recommendations:  Problem # 1:  BACK PAIN, CHRONIC  (ICD-724.5) Assessment Unchanged  Her updated medication list for this problem includes:    Skelaxin 800 Mg Tabs (Metaxalone) .Marland Kitchen... Take 1 tablet by mouth three times a day    Hydrocodone-acetaminophen 10-660 Mg Tabs (Hydrocodone-acetaminophen) ..... One tab by mouth qid    Fentanyl 25 Mcg/hr Pt72 (Fentanyl) .Marland Kitchen... Apply one patch every 3 days  Problem # 2:  HYPERTENSION (ICD-401.9) Assessment: Comment Only  The following medications were removed from the medication list:    Clonidine Hcl 0.3 Mg Tabs (Clonidine hcl) .Marland Kitchen... Take 1 tablet by mouth two times a day Her updated medication list for this problem includes:    Benicar Hct 40-25 Mg Tabs (Olmesartan medoxomil-hctz) .Marland Kitchen... Take 1 tablet by mouth once a day    Clonidine Hcl 0.3 Mg Tabs (Clonidine hcl) ..... One half in the morning and one at bedtime  Orders: CXR- 2view (CXR) EKG w/ Interpretation (93000), remote h/o nicotine, and HTN for preop eval, normal sinus rythm, no evidence of ischemia  BP today: 104/70 Prior BP: 180/100 (04/21/2009)  Labs Reviewed: K+: 4.7 (08/14/2008) Creat: : 0.90 (08/14/2008)   Chol: 116 (08/14/2008)   HDL: 40 (08/14/2008)   LDL: 54 (08/14/2008)   TG: 108 (08/14/2008)  Problem # 3:  OSTEOARTHRITIS, SEVERE (ICD-715.90) Assessment: Deteriorated  Her updated medication list for this problem includes:    Hydrocodone-acetaminophen 10-660 Mg Tabs (Hydrocodone-acetaminophen) ..... One tab by mouth qid    Fentanyl 25 Mcg/hr Pt72 (Fentanyl) .Marland Kitchen... Apply one patch every 3 days upcoming right hip surgery  Problem # 4:  ACUTE CYSTITIS (ICD-595.0) Assessment: Comment Only  Orders: T-Urinalysis ([id number])  Complete Medication List: 1)  Gabapentin 300 Mg Caps (Gabapentin) .... Take 1 tablet by mouth three times a day 2)  Multivitamins Tabs (Multiple vitamin) .... Take 1 tablet by mouth once a day 3)  Temazepam 30 Mg Caps (Temazepam) .... Take 1 tab by mouth at bedtime 4)  Skelaxin 800 Mg Tabs (Metaxalone)  .... Take 1 tablet by mouth three times a day 5)  Hydrocodone-acetaminophen 10-660 Mg Tabs (Hydrocodone-acetaminophen) .... One tab by mouth qid 6)  Benicar Hct 40-25 Mg Tabs (Olmesartan medoxomil-hctz) .... Take 1 tablet by mouth once a day 7)  Alprazolam 0.5 Mg Tabs (Alprazolam) .... Take 1 tab by mouth at bedtime 8)  Citalopram Hydrobromide 20 Mg Tabs (Citalopram hydrobromide) .... Take 1 tablet by mouth once a day 9)  Red Yeast Rice 600 Mg Caps (Red yeast rice extract) .... Take 1 tablet by mouth two times a day 10)  Q-10 Co-enzyme 30 Mg Caps (Coenzyme q10) .... Take 1 tablet by mouth once a day 11)  Premarin 0.625 Mg/gm Crea (Estrogens, conjugated) .... Apply sparingly three times weekly prn 12)  Fentanyl 25 Mcg/hr Pt72 (Fentanyl) .... Apply one patch every 3 days 13)  Clonidine Hcl 0.3 Mg Tabs (Clonidine hcl) .... One half in the morning and one at bedtime  Other Orders: T-Basic Metabolic Panel [phone]) T-Hepatic Function (  [phone]) T-Lipid Profile [phone]) T-CBC w/Diff [phone]) T-Vitamin D (25-Hydroxy) [phone])  Patient Instructions: 1)  Please schedule a follow-up appointment in 3.5 months. 2)  BMP prior to visit, ICD-9: 3)  Hepatic Panel prior to visit, ICD-9:  labs  today 4)  Lipid Panel prior to visit, ICD-9: 5)  CBC w/ Diff prior to visit, ICD-9: 6)  vit D 7)  cXR today 8)  pLs reduce the clonidine 0.3mg  tab to one half  in the morning and one at bedtime 9 reverse this today pls) 9)  your blood pressure is 104/70 today. Prescriptions: CLONIDINE HCL 0.3 MG TABS (CLONIDINE HCL) one half in the morning and one at bedtime  #45 x 4   Entered and Authorized by:   Syliva Overman MD   Signed by:   Syliva Overman MD on 05/26/2009   Method used:   Printed then faxed to ...       [facility]  [address] [address] [id number]       [address]       [place], [place]  [address]       Ph: [phone] or [phone]       Fax: [phone]    RxID:   [id number]

## 2010-03-09 NOTE — Progress Notes (Signed)
Summary: refill  Phone Note Call from Patient   Summary of Call: needs to get a refill on senatnyl 25mg  patch [phone] Initial call taken by: Rudene Anda,  May 20, 2009 2:16 PM  Follow-up for Phone Call        patient aware script available Follow-up by: Adella Hare LPN,  May 20, 2009 2:42 PM

## 2010-03-09 NOTE — Progress Notes (Signed)
Summary: paper work for surgery  Phone Note Call from Patient   Summary of Call: wants to know if all paper work is back for her surgery. [phone] Initial call taken by: Rudene Anda,  June 01, 2009 11:03 AM  Follow-up for Phone Call        patient aware medical clearance papers faxed last week Follow-up by: Adella Hare LPN,  June 01, 2009 2:25 PM

## 2010-03-09 NOTE — Progress Notes (Signed)
  Phone Note From Pharmacy   Caller: [facility] Summary of Call: vimovo is a combination and half of the med is the same as achiphex which patient is already on  Initial call taken by: Adella Hare LPN,  January 05, 2010 10:16 AM  Follow-up for Phone Call        her active list in chart has aciphex removed, pls tell pharmacy do not fill aciphex since pt is on vimovo Follow-up by: Syliva Overman MD,  January 05, 2010 12:22 PM  Additional Follow-up for Phone Call Additional follow up Details #1::        dr Lodema Hong, achiphex is still on active med list, would you like for me to remove this and then call pharmacy? Additional Follow-up by: Adella Hare LPN,  January 06, 2010 4:03 PM    Additional Follow-up for Phone Call Additional follow up Details #2::    pls remove aciphex and let pt know we are doing this and why and notify [facility]...sorry i messed up on that Follow-up by: Syliva Overman MD,  January 06, 2010 6:22 PM  Additional Follow-up for Phone Call Additional follow up Details #3:: Details for Additional Follow-up Action Taken: order sent to [facility] to discontinue achiphex and patient aware Additional Follow-up by: Adella Hare LPN,  January 08, 2010 3:26 PM

## 2010-03-09 NOTE — Progress Notes (Signed)
Summary: surgery  Phone Note Call from Patient   Summary of Call: FYI ONLY:  would like to let doc know she has surgery 07/21/09. [phone] Initial call taken by: Rudene Anda,  July 14, 2009 4:40 PM  Follow-up for Phone Call        noted Follow-up by: Syliva Overman MD,  July 15, 2009 5:16 PM

## 2010-03-09 NOTE — Assessment & Plan Note (Signed)
Summary: PR DR   Vital Signs:  Patient profile:   65 year old female Menstrual status:  hysterectomy Height:      65 inches Weight:      128.25 pounds BMI:     21.42 O2 Sat:      93 % Pulse rate:   90 / minute Pulse rhythm:   regular Resp:     16 per minute BP sitting:   180 / 100  (right arm) Cuff size:   regular  Vitals Entered By: Everitt Amber LPN (April 21, 2009 2:47 PM) CC: Patient BP elevated    Primary Care Provider:  Syliva Overman MD  CC:  Patient BP elevated .  History of Present Illness: Pt states she has not been doing at aLL WELL. sHE REPORTS INCREASED AND UNCONTROLLED BACK PAIN, AND IS NOW AMBULATING WITH A CANE BECAUSE OF DETERIORATION IN HER HIP JOINT. She is lso noiting that her blood pressure has been staying high when she checks it and this concerns her. he is experiencing increased depression and anxiety as her health deteriorates. She denies any recent flare ups of her herpes.  Current Medications (verified): 1)  Aciphex 20 Mg  Tbec (Rabeprazole Sodium) .... Take 1 Tablet By Mouth Once A Day 2)  Gabapentin 300 Mg  Caps (Gabapentin) .... Take 1 Tablet By Mouth Three Times A Day 3)  Multivitamins   Tabs (Multiple Vitamin) .... Take 1 Tablet By Mouth Once A Day 4)  Temazepam 30 Mg  Caps (Temazepam) .... Take 1 Tab By Mouth At Bedtime 5)  Clonidine Hcl 0.3 Mg Tabs (Clonidine Hcl) .... One Tab By Mouth Qhs 6)  Skelaxin 800 Mg Tabs (Metaxalone) .... Take 1 Tablet By Mouth Three Times A Day 7)  Hydrocodone-Acetaminophen 10-660 Mg Tabs (Hydrocodone-Acetaminophen) .... One Tab By Mouth Qid 8)  Benicar Hct 40-25 Mg Tabs (Olmesartan Medoxomil-Hctz) .... Take 1 Tablet By Mouth Once A Day 9)  Alprazolam 0.5 Mg Tabs (Alprazolam) .... Take 1 Tab By Mouth At Bedtime 10)  Citalopram Hydrobromide 20 Mg Tabs (Citalopram Hydrobromide) .... Take 1 Tablet By Mouth Once A Day 11)  Red Yeast Rice 600 Mg Caps (Red Yeast Rice Extract) .... Take 1 Tablet By Mouth Two Times A  Day 12)  Q-10 Co-Enzyme 30 Mg Caps (Coenzyme Q10) .... Take 1 Tablet By Mouth Once A Day 13)  Premarin 0.625 Mg/gm Crea (Estrogens, Conjugated) .... Apply Sparingly Three Times Weekly Prn  Allergies (verified): No Known Drug Allergies  Review of Systems      See HPI General:  Complains of fever and sleep disorder; denies chills. Eyes:  Denies blurring and discharge. ENT:  Denies hoarseness, nasal congestion, and sinus pressure. CV:  Denies chest pain or discomfort, palpitations, and swelling of feet. Resp:  Denies cough and sputum productive. GI:  Denies abdominal pain, constipation, diarrhea, nausea, and vomiting. GU:  Denies dysuria and urinary frequency. MS:  Complains of joint pain, low back pain, mid back pain, muscle weakness, and stiffness; worsening. Derm:  Complains of lesion(s); denies rash; herpes type 2 inactive at this time. Neuro:  Complains of headaches; denies seizures and sensation of room spinning; occasional headaches. Psych:  Complains of anxiety and depression; denies panic attacks, sense of great danger, suicidal thoughts/plans, thoughts of violence, and unusual visions or sounds. Endo:  Denies cold intolerance, excessive hunger, excessive thirst, and heat intolerance. Heme:  Denies abnormal bruising and bleeding. Allergy:  Denies hives or rash and itching eyes.  Physical Exam  General:  Well-developed,well-nourished,in no acute distress; alert,appropriate and cooperative throughout examinationPt in obviopus pain. HEENT: No facial asymmetry,  EOMI, No sinus tenderness, TM's Clear, oropharynx  pink and moist.   Chest: Clear to auscultation bilaterally.  CVS: S1, S2, No murmurs, No S3.   Abd: Soft, Nontender.  MSdecreased  ROM spine,adequate in  hips, shoulders and knees.  Ext: No edema.   CNS: CN 2-12 intact, power tone and sensation normal throughout.   Skin: Intact, no visible lesions or rashes.  Psych: Good eye contact, normal affect.  Memory intact,  depressed appearing.    Impression & Recommendations:  Problem # 1:  HYPERTENSION (ICD-401.9) Assessment Unchanged  Her updated medication list for this problem includes:    Clonidine Hcl 0.3 Mg Tabs (Clonidine hcl) ..... One tab by mouth qhs    Benicar Hct 40-25 Mg Tabs (Olmesartan medoxomil-hctz) .Marland Kitchen... Take 1 tablet by mouth once a day  BP today: 180/100 Prior BP: 180/100 (04/16/2009)  Labs Reviewed: K+: 4.7 (08/14/2008) Creat: : 0.90 (08/14/2008)   Chol: 116 (08/14/2008)   HDL: 40 (08/14/2008)   LDL: 54 (08/14/2008)   TG: 108 (08/14/2008)  Problem # 2:  BACK PAIN, CHRONIC (ICD-724.5) Assessment: Deteriorated  The following medications were removed from the medication list:    Flexeril 10 Mg Tabs (Cyclobenzaprine hcl) .Marland Kitchen... Take 1 tablet by mouth three times a day Her updated medication list for this problem includes:    Skelaxin 800 Mg Tabs (Metaxalone) .Marland Kitchen... Take 1 tablet by mouth three times a day    Hydrocodone-acetaminophen 10-660 Mg Tabs (Hydrocodone-acetaminophen) ..... One tab by mouth qid    Fentanyl 25 Mcg/hr Pt72 (Fentanyl) .Marland Kitchen... Apply one patch every 3 days  Problem # 3:  DEPRESSION (ICD-311) Assessment: Deteriorated  Her updated medication list for this problem includes:    Alprazolam 0.5 Mg Tabs (Alprazolam) .Marland Kitchen... Take 1 tab by mouth at bedtime    Citalopram Hydrobromide 20 Mg Tabs (Citalopram hydrobromide) .Marland Kitchen... Take 1 tablet by mouth once a day  Complete Medication List: 1)  Aciphex 20 Mg Tbec (Rabeprazole sodium) .... Take 1 tablet by mouth once a day 2)  Gabapentin 300 Mg Caps (Gabapentin) .... Take 1 tablet by mouth three times a day 3)  Multivitamins Tabs (Multiple vitamin) .... Take 1 tablet by mouth once a day 4)  Temazepam 30 Mg Caps (Temazepam) .... Take 1 tab by mouth at bedtime 5)  Clonidine Hcl 0.3 Mg Tabs (Clonidine hcl) .... One tab by mouth qhs 6)  Skelaxin 800 Mg Tabs (Metaxalone) .... Take 1 tablet by mouth three times a day 7)   Hydrocodone-acetaminophen 10-660 Mg Tabs (Hydrocodone-acetaminophen) .... One tab by mouth qid 8)  Benicar Hct 40-25 Mg Tabs (Olmesartan medoxomil-hctz) .... Take 1 tablet by mouth once a day 9)  Alprazolam 0.5 Mg Tabs (Alprazolam) .... Take 1 tab by mouth at bedtime 10)  Citalopram Hydrobromide 20 Mg Tabs (Citalopram hydrobromide) .... Take 1 tablet by mouth once a day 11)  Red Yeast Rice 600 Mg Caps (Red yeast rice extract) .... Take 1 tablet by mouth two times a day 12)  Q-10 Co-enzyme 30 Mg Caps (Coenzyme q10) .... Take 1 tablet by mouth once a day 13)  Premarin 0.625 Mg/gm Crea (Estrogens, conjugated) .... Apply sparingly three times weekly prn 14)  Fentanyl 25 Mcg/hr Pt72 (Fentanyl) .... Apply one patch every 3 days  Patient Instructions: 1)  Please schedule a follow-up appointment in 4 weeks 2)  pls increase the clonidine to one every  12 hrs. 3)  new med for pain. Prescriptions: BENICAR HCT 40-25 MG TABS (OLMESARTAN MEDOXOMIL-HCTZ) Take 1 tablet by mouth once a day  #90 x 2   Entered by:   Everitt Amber LPN   Authorized by:   Syliva Overman MD   Signed by:   Everitt Amber LPN on 16/11/9602   Method used:   Historical   RxID:   [id number] CITALOPRAM HYDROBROMIDE 20 MG TABS (CITALOPRAM HYDROBROMIDE) Take 1 tablet by mouth once a day  #90 x 1   Entered by:   Everitt Amber LPN   Authorized by:   Syliva Overman MD   Signed by:   Everitt Amber LPN on 21/30/8657   Method used:   Historical   RxID:   [id number] ACIPHEX 20 MG  TBEC (RABEPRAZOLE SODIUM) Take 1 tablet by mouth once a day  #90 x 2   Entered by:   Everitt Amber LPN   Authorized by:   Syliva Overman MD   Signed by:   Everitt Amber LPN on 02/09/7251   Method used:   Historical   RxID:   [id number] SKELAXIN 800 MG TABS (METAXALONE) Take 1 tablet by mouth three times a day  #270 x 1   Entered by:   Everitt Amber LPN   Authorized by:   Syliva Overman MD   Signed by:   Everitt Amber LPN on 75/64/3329   Method used:    Historical   RxID:   [id number] CLONIDINE HCL 0.3 MG TABS (CLONIDINE HCL) ONE TAB by mouth QHS  #180 x 1   Entered by:   Everitt Amber LPN   Authorized by:   Syliva Overman MD   Signed by:   Everitt Amber LPN on 09/32/3557   Method used:   Historical   RxID:   [id number] TEMAZEPAM 30 MG  CAPS (TEMAZEPAM) Take 1 tab by mouth at bedtime  #30 x 3   Entered by:   Everitt Amber LPN   Authorized by:   Syliva Overman MD   Signed by:   Everitt Amber LPN on 83/15/1761   Method used:   Historical   RxID:   [id number] GABAPENTIN 300 MG  CAPS (GABAPENTIN) Take 1 tablet by mouth three times a day  #90 Capsule x 3   Entered by:   Everitt Amber LPN   Authorized by:   Syliva Overman MD   Signed by:   Everitt Amber LPN on 27/04/5007   Method used:   Historical   RxID:   [id number] ALPRAZOLAM 0.5 MG TABS (ALPRAZOLAM) Take 1 tab by mouth at bedtime  #30 x 3   Entered by:   Everitt Amber LPN   Authorized by:   Syliva Overman MD   Signed by:   Everitt Amber LPN on 38/11/1749   Method used:   Historical   RxID:   [id number] HYDROCODONE-ACETAMINOPHEN 10-660 MG TABS (HYDROCODONE-ACETAMINOPHEN) ONE TAB by mouth QID  #120 x 3   Entered by:   Everitt Amber LPN   Authorized by:   Syliva Overman MD   Signed by:   Everitt Amber LPN on 14/43/1540   Method used:   Historical   RxID:   [id number] FENTANYL 25 MCG/HR PT72 (FENTANYL) apply one patch every 3 days  #10 x 0   Entered and Authorized by:   Syliva Overman MD   Signed by:   Syliva Overman MD on 04/21/2009   Method used:   Historical   RxID:   [id number]

## 2010-03-09 NOTE — Letter (Signed)
Summary: dr. Chaney Malling  dr. Chaney Malling   Imported By: Lind Guest 05/28/2009 14:07:16  _____________________________________________________________________  External Attachment:    Type:   Image     Comment:   External Document

## 2010-03-09 NOTE — Progress Notes (Signed)
Summary: NEEDS LETTER  Phone Note Call from Patient   Summary of Call: ORTH SAID SHE CAN GET HIP DONE AND NEEDS A LETTER STATING THAT SHE IS OK FOR SURGERY AND CAN THIS BEEN DONE OR DOES SHE  NEED TO COME IN Initial call taken by: Lind Guest,  May 25, 2009 10:38 AM  Follow-up for Phone Call        please have patient come in for this, bp was too high last visit Follow-up by: Adella Hare LPN,  May 25, 2009 1:00 PM  Additional Follow-up for Phone Call Additional follow up Details #1::        LEFT MESSAGE Additional Follow-up by: Lind Guest,  May 25, 2009 2:13 PM    Additional Follow-up for Phone Call Additional follow up Details #2::    PATIENT HAS APPOINMENT Follow-up by: Lind Guest,  May 25, 2009 4:00 PM

## 2010-03-09 NOTE — Letter (Signed)
Summary: Discharge Summary  Discharge Summary   Imported By: Lind Guest 08/27/2009 08:13:00  _____________________________________________________________________  External Attachment:    Type:   Image     Comment:   External Document

## 2010-03-09 NOTE — Progress Notes (Signed)
Summary: [facility]  Phone Note Call from Patient   Summary of Call: [facility] called left message  the phar. had spoke with jaime and needs for you to call back to clarfy this matter please call at [phone] ref # [phone] Initial call taken by: Lind Guest,  January 06, 2010 10:52 AM  Follow-up for Phone Call        the number provided here is incorrect Follow-up by: Adella Hare LPN,  January 06, 2010 5:55 PM

## 2010-03-09 NOTE — Miscellaneous (Signed)
Summary: NARC REFILL  Clinical Lists Changes  Medications: Rx of FENTANYL 25 MCG/HR PT72 (FENTANYL) apply one patch every 3 days;  #10 x 0;  Signed;  Entered by: Everitt Amber LPN;  Authorized by: Syliva Overman MD;  Method used: Handwritten    Prescriptions: FENTANYL 25 MCG/HR PT72 (FENTANYL) apply one patch every 3 days  #10 x 0   Entered by:   Everitt Amber LPN   Authorized by:   Syliva Overman MD   Signed by:   Everitt Amber LPN on 42/70/6237   Method used:   Handwritten   RxID:   [id number]

## 2010-03-09 NOTE — Progress Notes (Signed)
Summary: refill  Phone Note Call from Patient   Summary of Call: pt needs to get alprazolam today. she is leaving and going out of town tomorrow. please call her [phone] Initial call taken by: Rudene Anda,  February 24, 2009 2:36 PM  Follow-up for Phone Call        Phone Call Completed, Rx Called In Follow-up by: Worthy Keeler LPN,  February 24, 2009 4:29 PM    Prescriptions: ALPRAZOLAM 0.5 MG TABS (ALPRAZOLAM) Take 1 tab by mouth at bedtime  #30 x 4   Entered by:   Worthy Keeler LPN   Authorized by:   Syliva Overman MD   Signed by:   Worthy Keeler LPN on 16/11/9602   Method used:   Printed then faxed to ...       [facility]  [address] [address] [id number] (retail)       [address]       [place], [place]  [address]       Ph: [phone] or [phone]       Fax: [phone]   RxID:   [id number]

## 2010-03-09 NOTE — Progress Notes (Signed)
Summary: call  Phone Note Call from Patient   Summary of Call: call her at [phone] Initial call taken by: Lind Guest,  October 28, 2009 1:15 PM  Follow-up for Phone Call        i gave verbal order for hydrocodone for a 5 day supply which is all they will fill #20 they need me to mail a hard copy to them  [facility] [address] [place] [address] Follow-up by: Adella Hare LPN,  October 28, 2009 1:45 PM  Additional Follow-up for Phone Call Additional follow up Details #1::        script written for 20 tabs Additional Follow-up by: Syliva Overman MD,  October 28, 2009 3:24 PM    Additional Follow-up for Phone Call Additional follow up Details #2::    mailed Follow-up by: Adella Hare LPN,  October 28, 2009 3:38 PM

## 2010-03-15 DIAGNOSIS — R3911 Hesitancy of micturition: Secondary | ICD-10-CM | POA: Insufficient documentation

## 2010-03-25 NOTE — Assessment & Plan Note (Signed)
Summary: office visit   Vital Signs:  Patient profile:   65 year old female Menstrual status:  hysterectomy Height:      65 inches Weight:      128 pounds BMI:     21.38 O2 Sat:      98 % Pulse rate:   57 / minute Pulse rhythm:   regular Resp:     16 per minute BP sitting:   140 / 80  (left arm) Cuff size:   regular  Vitals Entered By: Everitt Amber LPN (February 24, 2010 2:40 PM) CC: Follow up chronic problems   Primary Care Provider:  Syliva Overman MD  CC:  Follow up chronic problems.  History of Present Illness: Pt reports that she continues to have left groin pain states yrs ago dr Kendell Bane told her she had a left groi hernia, no mention made of this on her ct scan, aggravated by bending or squatting, will call dr Kendell Bane  with questions when she is ready.  Pooor urinary stream in past 1 yr, unchanged, difficulty voiding.  Improvement in her depression/anxiety.  Haopppier wit her hip surgaery, on the left pain is better.   Denies recent fever or chills. Denies sinus pressure, nasal congestion , ear pain or sore throat. Denies chest congestion, or cough productive of sputum. Denies chest pain, palpitations, PND, orthopnea or leg swelling. Denies abdominal pain, nausea, vomitting, diarrhea or constipation. Denies change in bowel movements or bloody stool. Denies dysuria  or frequency,  Denies headaches, vertigo, seizures. Denies uncontrolled  depression, anxiety or insomnia. Denies  rash, lesions, or itch.        Current Medications (verified): 1)  Temazepam 30 Mg  Caps (Temazepam) .... Take 1 Tab By Mouth At Bedtime 2)  Alprazolam 0.5 Mg Tabs (Alprazolam) .... Take 1 Tab By Mouth At Bedtime 3)  Citalopram Hydrobromide 20 Mg Tabs (Citalopram Hydrobromide) .... Take 1 Tablet By Mouth As Needed 4)  Premarin 0.625 Mg/gm Crea (Estrogens, Conjugated) .... Apply Sparingly Three Times Weekly Prn 5)  Aciphex 20 Mg Tbec (Rabeprazole Sodium) .... Take 1 Tablet By Mouth As  Needed 6)  Clonidine Hcl 0.3 Mg Tabs (Clonidine Hcl) .... One Tablet Twice Daily 7)  Hydrocodone-Acetaminophen 10-325 Mg Tabs (Hydrocodone-Acetaminophen) .... Take 1 Tablet By Mouth Four Times A Day 8)  Acyclovir 800 Mg Tabs (Acyclovir) .Marland Kitchen.. 1 Tab Fuve Times A Day For Sevein Days 9)  Metaxalone 800 Mg Tabs (Metaxalone) .... Take 1 Tablet By Mouth Three Times A Day As Needed For Uncontrolled Muscle Spasm 10)  Gabapentin 300 Mg Caps (Gabapentin) .... Two Capsules 3 Times Daily 11)  Vimovo 500-20 Mg Tbec (Naproxen-Esomeprazole) .... Take 1 Tablet By Mouth Two Times A Day For 7 Days, Then 1 Twice Daily As Needed 12)  Psyllium 58.6 % Powd (Psyllium) .... Uad Twice A Week 13)  Diclofenac Sodium 75 Mg Tbec (Diclofenac Sodium) .... Take 1 Tablet By Mouth Two Times A Day  Allergies (verified): No Known Drug Allergies  Review of Systems      See HPI General:  Complains of fatigue. Eyes:  Complains of vision loss-both eyes. GU:  Complains of urinary hesitancy. MS:  Complains of joint pain, low back pain, mid back pain, muscle weakness, and stiffness. Psych:  Complains of anxiety and depression; denies mental problems, suicidal thoughts/plans, thoughts of violence, and unusual visions or sounds. Endo:  Denies cold intolerance, excessive hunger, excessive thirst, excessive urination, heat intolerance, and polyuria. Heme:  Denies abnormal bruising and bleeding. Allergy:  Denies hives or rash and itching eyes.  Physical Exam  General:  Well-developed,well-nourished,in no acute distress; alert,appropriate and cooperative throughout examination HEENT: No facial asymmetry,  EOMI, No sinus tenderness, TM's Clear, oropharynx  pink and moist.   Chest: Clear to auscultation bilaterally.  CVS: S1, S2, No murmurs, No S3.   Abd: Soft,right lower quadrant tenderness, no guarding or rbound, possible rLQ mass MS: decreased  ROM spine,and  hips, adequate in shoulders and knees.  Ext: No edema.   CNS: CN 2-12  intact, power tone and sensation normal throughout.   Skin: Intact, no visible lesions or rashes.  Psych: Good eye contact, normal affect.  Memory intact, mildly anxious, not  depressed appearing.    Impression & Recommendations:  Problem # 1:  BACK PAIN, CHRONIC (ICD-724.5) Assessment Unchanged  Her updated medication list for this problem includes:    Hydrocodone-acetaminophen 10-325 Mg Tabs (Hydrocodone-acetaminophen) .Marland Kitchen... Take 1 tablet by mouth four times a day    Metaxalone 800 Mg Tabs (Metaxalone) .Marland Kitchen... Take 1 tablet by mouth three times a day as needed for uncontrolled muscle spasm    Vimovo 500-20 Mg Tbec (Naproxen-esomeprazole) .Marland Kitchen... Take 1 tablet by mouth two times a day for 7 days, then 1 twice daily as needed    Diclofenac Sodium 75 Mg Tbec (Diclofenac sodium) .Marland Kitchen... Take 1 tablet by mouth two times a day  Problem # 2:  GERD (ICD-530.81) Assessment: Improved  Her updated medication list for this problem includes:    Aciphex 20 Mg Tbec (Rabeprazole sodium) .Marland Kitchen... Take 1 tablet by mouth as needed  Problem # 3:  DEPRESSION (ICD-311) Assessment: Improved  Her updated medication list for this problem includes:    Alprazolam 0.5 Mg Tabs (Alprazolam) .Marland Kitchen... Take 1 tab by mouth at bedtime    Citalopram Hydrobromide 20 Mg Tabs (Citalopram hydrobromide) .Marland Kitchen... Take 1 tablet by mouth as needed  Problem # 4:  HYPERTENSION (ICD-401.9) Assessment: Deteriorated  The following medications were removed from the medication list:    Clonidine Hcl 0.3 Mg Tabs (Clonidine hcl) ..... One tablet twice daily Her updated medication list for this problem includes:    Clonidine Hcl 0.3 Mg Tabs (Clonidine hcl) ..... One tablet in the morning and one and a half tablets at bedtime  BP today: 140/80 Prior BP: 120/70 (11/24/2009)  Labs Reviewed: K+: 4.1 (12/02/2009) Creat: : 0.87 (12/02/2009)   Chol: 134 (05/26/2009)   HDL: 49 (05/26/2009)   LDL: 65 (05/26/2009)   TG: 102 (05/26/2009)  Problem #  5:  URINARY HESITANCY (JYN-829.56) Assessment: Deteriorated pt to cALL WHEN READY FOR UROLOGIC EVAL  Complete Medication List: 1)  Temazepam 30 Mg Caps (Temazepam) .... Take 1 tab by mouth at bedtime 2)  Alprazolam 0.5 Mg Tabs (Alprazolam) .... Take 1 tab by mouth at bedtime 3)  Citalopram Hydrobromide 20 Mg Tabs (Citalopram hydrobromide) .... Take 1 tablet by mouth as needed 4)  Premarin 0.625 Mg/gm Crea (Estrogens, conjugated) .... Apply sparingly three times weekly prn 5)  Aciphex 20 Mg Tbec (Rabeprazole sodium) .... Take 1 tablet by mouth as needed 6)  Hydrocodone-acetaminophen 10-325 Mg Tabs (Hydrocodone-acetaminophen) .... Take 1 tablet by mouth four times a day 7)  Acyclovir 800 Mg Tabs (Acyclovir) .Marland Kitchen.. 1 tab fuve times a day for sevein days 8)  Metaxalone 800 Mg Tabs (Metaxalone) .... Take 1 tablet by mouth three times a day as needed for uncontrolled muscle spasm 9)  Gabapentin 300 Mg Caps (Gabapentin) .... Two capsules 3 times daily 10)  Vimovo 500-20 Mg Tbec (Naproxen-esomeprazole) .... Take 1 tablet by mouth two times a day for 7 days, then 1 twice daily as needed 11)  Psyllium 58.6 % Powd (Psyllium) .... Uad twice a week 12)  Diclofenac Sodium 75 Mg Tbec (Diclofenac sodium) .... Take 1 tablet by mouth two times a day 13)  Clonidine Hcl 0.3 Mg Tabs (Clonidine hcl) .... One tablet in the morning and one and a half tablets at bedtime  Patient Instructions: 1)  Please schedule a follow-up appointment in 4.5 months. 2)  I am happy that you are doing better overall. 3)    Pls increase the dose of the clonidine to one and a half at bedtime, continue one every morning. 4)  No other med chnges at this time 5)  Pls call for a referral to urology when  you decide you need help withh your urine  stream   Orders Added: 1)  Est. Patient Level IV [75643]    Vimovo samples given x4 [id number] 09/22/10 Adella Hare LPN  February 24, 2010 3:46 PM

## 2010-04-27 ENCOUNTER — Telehealth: Payer: Self-pay | Admitting: Family Medicine

## 2010-04-28 NOTE — Telephone Encounter (Signed)
Okay to refill flexeril? Not on patients med list.

## 2010-04-28 NOTE — Telephone Encounter (Signed)
Refill x 3 PLEASE

## 2010-04-29 MED ORDER — CYCLOBENZAPRINE HCL 10 MG PO TABS: 10.0000 mg | ORAL_TABLET | Freq: Three times a day (TID) | ORAL | Status: AC | PRN

## 2010-04-29 NOTE — Telephone Encounter (Signed)
Addended by: Adella Hare on: 04/29/2010 02:12 PM   Modules accepted: Orders

## 2010-04-29 NOTE — Telephone Encounter (Signed)
Med sent.

## 2010-06-07 ENCOUNTER — Other Ambulatory Visit: Payer: Self-pay | Admitting: Family Medicine

## 2010-06-09 ENCOUNTER — Other Ambulatory Visit: Payer: Self-pay

## 2010-06-09 MED ORDER — TEMAZEPAM 30 MG PO CAPS: 30.0000 mg | ORAL_CAPSULE | Freq: Every evening | ORAL | Status: DC | PRN

## 2010-06-11 ENCOUNTER — Telehealth: Payer: Self-pay | Admitting: Family Medicine

## 2010-06-11 DIAGNOSIS — M79641 Pain in right hand: Secondary | ICD-10-CM

## 2010-06-11 NOTE — Telephone Encounter (Signed)
X ray has been ordered , it will need to be stamped , then faxe, pls let her know

## 2010-06-11 NOTE — Telephone Encounter (Signed)
After speaking with her she called back and again asked Luann for order to be faxed to [facility] for xray because she doesn't want to go to the ER

## 2010-06-11 NOTE — Telephone Encounter (Signed)
She was planting in the garden and a snake jumped out and she fell on the brick sidewalk and gravel. She fell on her right hand trying to break her fall. Not able to move it much. It is swollen now and she can't use it. Advised ER

## 2010-06-11 NOTE — Telephone Encounter (Signed)
X ray sent and called patient again, no answer

## 2010-06-11 NOTE — Telephone Encounter (Signed)
Sent xray request. Called patient no answer

## 2010-06-22 NOTE — H&P (Signed)
NAME:  Renee Waters, Renee Waters              ACCOUNT NO.:  [id number]   MEDICAL RECORD NO.:  [id number]          PATIENT TYPE:  AMB   LOCATION:  DAY                           FACILITY:  [facility]   PHYSICIAN:  R. Roetta Sessions, M.D. DATE OF BIRTH:  [birth year]   DATE OF ADMISSION:  DATE OF DISCHARGE:  LH                              HISTORY & PHYSICAL   PRIMARY CARE PHYSICIAN:  Dr. Lodema Hong.   CHIEF COMPLAINT:  Left upper quadrant pain.   HISTORY OF PRESENT ILLNESS:  Renee Waters is a 65 year old African American  female with a history of left upper quadrant pain for about 2 weeks now.  She describes the pain as daily, it is intermittent.  She has at least 2  episodes per day.  She describes the pain as sharp at the left upper  quadrant, at times it is stabbing and lasts generally around 5 minutes  or more.  She tells me it is made worse with coughing, yawning, leaning  forward, and movement in general.  She complains of a bloating to the  left upper quadrant as well.  She thinks that she may have a hernia.  She resumed her Aciphex 20 mg daily about 2 weeks ago and she has  history of GERD and Barrett esophagus.  She denies any nausea, vomiting,  diarrhea, or constipation.  She has had some anorexia.  She rates the  pain 10/10 on pain scale.  She tells me it takes my breath away.  Her  bowel movements have been somewhat hard, but she really insists to  taking Vicodin for chronic pain.  She has had some straining with  stools.  She denies any rectal bleeding or melena.  She has lost about 7  pounds in the last 3 months.   PAST MEDICAL AND SURGICAL HISTORY:  1. She has history of Barrett esophagus, status post last EGD by Dr.      Jena Gauss on July 30, 2003.  She was found to have a 3-cm tongue of      salmon-colored epithelium in the distal esophagus.  She had a very      small hiatal hernia.  2. She had a normal colonoscopy and terminal ileoscopy on July 30, 2003, by Dr. Jena Gauss.  3. She has history  of osteoarthritis, hypertension, anxiety,      depression, chronic back pain, insomnia, type 2 HSV, complete      hysterectomy, cervical disk fusion, and left shoulder surgery.   CURRENT MEDICATIONS:  1. Clonidine 0.3 mg nightly.  2. Cyclobenzaprine 10 mg t.i.d.  3. Citalopram 40 mg.  4. Aciclovir 800 mg p.r.n.  5. Temazepam 30 mg nightly.  6. Aciphex 20 mg daily.  7. Vicodin 10/660 mg 3-4 daily.   ALLERGIES:  No known drug allergies.   FAMILY HISTORY:  No known family history of colorectal carcinoma, liver,  or chronic GI problems.  Mother deceased at 53 secondary to pneumonia.  Father's history is unknown.  She has 1 healthy sister.   SOCIAL HISTORY:  Ms. Dusseau has been married for 21 years.  She has 4  healthy children.  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  She admitted to [place] approximately 5 years ago.  She  retired from a youth Corporate investment banker.  She has a less than 33-pack-  year history of tobacco use, quit several years ago.  Rarely consumes  alcohol once a year or so and denies any drug use.   REVIEW OF SYSTEMS:  See HPI.  GYN:  She has been having some white  cloudy vaginal discharge for which she has seen Dr. Lodema Hong and had some  tests done earlier this week.  She is under the impression that  everything checked out okay.   PHYSICAL EXAMINATION:  VITAL SIGNS:  Weight 128 pounds, height 65-1/2  inches, temperature 97.7, blood pressure 100/62, and pulse 78.  GENERAL:  She is a well-developed, well-nourished, alert, oriented,  pleasant, and cooperative [place] American female in no acute distress.  HEENT:  Clear. Sclerae clear, nonicteric.  Conjunctivae pink.  Oropharynx pink and moist without lesions.  NECK:  Supple without mass or thyromegaly.  CHEST:  Heart is regular rate and rhythm.  Normal S1 and S2 without  murmurs, clicks, rubs, or gallops.  LUNGS:  Clear to auscultation bilaterally.  ABDOMEN:  Positive bowel sounds x4.  No bruits  auscultated.  Abdomen is  soft, nondistended, and mildly tender to the left upper quadrant just  below the mid sternal border.  There is no rebound, tenderness, or  guarding.  Unable to palpate hepatosplenomegaly or mass.  Unable to  appreciate any hernia lying supine as well as standing upright and  leaning forward.  EXTREMITIES:  Without clubbing or edema.   IMPRESSION:  Ms.  Waters is a 65 year old female with chronic  gastroesophageal reflux disease and Barrett esophagus.  She has had a 2-  week history of intermittent left upper quadrant pain, which is quite  severe.  She has resumed proton pump inhibitor.  This has not affected  her pain.  I am unable to appreciate any type of hernia on exam, nor  splenomegaly.  I do feel she needs further evaluation to rule out  pancreatitis, diverticulitis, hernia, or less likely splenomegaly.   She is going to need followup on her Barrett esophagus as she is overdue  for surveillance.   PLAN:  1. Continue Aciphex 20 mg daily and we have given her samples today.  2. CBC, LFT, MET-7, amylase, and lipase.  3. EGD with Dr. Jena Gauss to follow up on Barrett esophagus.  I have      discussed the procedure including risks and benefits which      included, but not limited to bleeding, infection, perforation, and      drug reaction.  She agrees with the plan, and consent will be      obtained.  4. CT of the abdomen and pelvis with IV and oral contrast in the very      near future.  I will call her with results.  5. She has Vicodin at home to take for pain p.r.n.  6. She is instructed to go immediately to the emergency room if severe      pain recurs or call our office and visit during business hours.      Lorenza Burton, N.P.      Jonathon Bellows, M.D.  Electronically Signed    KJ/MEDQ  D:  11/15/2007  T:  11/16/2007  Job:  [id number]   cc:   Milus Mallick. Lodema Hong,  M.D.  Fax: [phone]

## 2010-06-22 NOTE — Op Note (Signed)
NAME:  Renee Waters, Renee Waters              ACCOUNT NO.:  [id number]   MEDICAL RECORD NO.:  [id number]          PATIENT TYPE:  AMB   LOCATION:  DAY                           FACILITY:  [facility]   PHYSICIAN:  R. Roetta Sessions, M.D. DATE OF BIRTH:  26-Sep-1945   DATE OF PROCEDURE:  11/29/2007  DATE OF DISCHARGE:                               OPERATIVE REPORT   PROCEDURE PERFORMED:  Esophagogastroduodenoscopy with esophageal biopsy.   INDICATIONS FOR PROCEDURE:  A 61-year Afro-American lady with short-  segment Barrett esophagus biopsy proven on EGD back in June 2005.  She  has not had a followup examination.  She is not really having any upper  GI tract symptoms.  She does have gastroesophageal reflux disease, but  the symptoms are pretty well sequential, Aciphex 20 mg orally daily.  She has had some lower abdominal pain recently and underwent a CT scan.  No acute inflammatory neoplastic findings, although she had quite a bit  of stool in her colon, was started on some MiraLax.  This been  associated with both improvement of bowel function and resolution of  abdominal pain.  EGD is now being done as a surveillance maneuver.  Risks, benefits, alternatives, and limitations have been reviewed.  Please see documentation in the medical record.   PROCEDURE NOTE:  O2 saturation, blood pressure, pulse, respirations,  monitored throughout the entire procedure.   CONSCIOUS SEDATION:  Versed 6 mg IV Demerol 125 mg IV in divided doses.   INSTRUMENT:  Pentax video chip system.  Cetacaine spray topical  pharyngeal anesthesia.   FINDINGS:  Examination of the tubular esophagus revealed the tongue with  salmon-colored epithelium coming up above the squamocolumnar junction/EG  junction.  The longest time was about 3 cm, the short time was about 1.5  cm.  There was no esophagitis.  No evidence of neoplasia or other  abnormality.  The tubular esophagus through the EG junction was widely  patent and easily  traversed with the scope down into the stomach.  Gastric cavity was emptied and insufflated well with air.  Thorough  examination of the gastric mucosa including retroflexion of the proximal  stomach and esophagogastric junction demonstrated only a small hiatal  hernia.  Pylorus patent and easily traversed.  Examination of the bulb  and second portion revealed no abnormalities.  Therapeutic/diagnostic  maneuver was performed.  Biopsy of the tongue salmon-colored epithelium  was taken for histologic study.  The patient tolerated the procedure and  was reacted in Endoscopy.   IMPRESSION:  1. __________ tongue salmon-colored epithelium, longest stable at 3      cm, distal esophagus as described previously status post biopsy.  2. Hiatal hernia, otherwise normal stomach D1 and D2.   RECOMMENDATIONS:  1. Continue Aciphex 20 mg orally daily.  2. Continue MiraLax as directed, constipation.  3. Follow-up on biopsies.  Further recommendations to follow.       Jonathon Bellows, M.D.  Electronically Signed     RMR/MEDQ  D:  11/29/2007  T:  11/30/2007  Job:  [id number]   cc:   Milus Mallick. Lodema Hong, M.D.  Fax: [phone]

## 2010-06-22 NOTE — Op Note (Signed)
NAME:  Renee Waters, Renee Waters              ACCOUNT NO.:  [id number]   MEDICAL RECORD NO.:  [id number]          PATIENT TYPE:  AMB   LOCATION:  DSC                          FACILITY:  [facility]   PHYSICIAN:  Rodney A. Mortenson, M.D.DATE OF BIRTH:  [birth year]   DATE OF PROCEDURE:  07/27/2006  DATE OF DISCHARGE:                               OPERATIVE REPORT   JUSTIFICATION:  A 65 year old female with a 79-month history of pain  about her left shoulder.  Neurological exam of the upper extremity  normal.  There is tenderness over the biceps.  Impingement testing is  painful.  Cross-arm test is painful, and there is definite tenderness  over the [facility] joint.  An MRI was done.  This shows intrasubstance tears of  the supraspinatus and a questionable longitudinal split tear of the  biceps, no full thickness tear.  Because of persistent pain and  discomfort which has failed all conservative care, the patient now  admitted for arthroscopic evaluation and treatment.  Questions answered  preoperatively.  Complications discussed.  No guarantee as to outcome  could be given, and the patient wished to proceed.   Justification for outpatient surgery minimal morbidity.   PREOPERATIVE DIAGNOSIS:  Intrasubstance tear, rotator cuff, left  shoulder, intra-articular; early degenerative changes, left  acromioclavicular joint; impingement syndrome, left shoulder.   OPERATION:  Arthroscopy; debride articular surface tear of  supraspinatus; arthroscopic acromioplasty; resection of distal clavicle  arthroscopically, left shoulder.   SURGEON:  Mortenson.   ASSISTANT:  Clark.   ANESTHESIA:  General.   PROCEDURE:  The patient placed on the operating table in supine  position.  After satisfactory general anesthesia, the patient was placed  in the semi-sitting position.  Left upper extremity was prepped with  DuraPrep and draped out in the usual manner.  Standard posterior portal  was made, and arthroscope was  introduced into the shoulder.  An  operative portal was made anteriorly.  Articular cartilage over the  humeral head __________  normal.  The biceps was normal.  No split tears  were seen.  There was fraying and tearing of the articular surface of  the supraspinatus which extends from glenoid out to the exit of the  biceps.  Through the anterior operative portal, full radius resector was  inserted, and this frayed and torn portion of the supraspinatus was  debrided.  This was partial thickness tearing, not a full-thickness  tear.  Once this was done to my satisfaction, the scope was removed and  placed in the subacromial space.  The ArthroCare wand was inserted, and  the bursa was diminished.  The undersurface of acromion was clearly  seen.  There was definite fraying and tearing of the acromial surface of  the supraspinatus also, but no full-thickness tearing was seen.  The  soft tissue was taken off of the undersurface of the acromion, the [facility]  joint was identified and the capsule at this level was removed with  ArthroWand.  Bleeding was controlled very nicely.  Distal clavicle could  clearly be seen.  Through the lateral port, a 4-mm bur was inserted, and  an acromioplasty of the anterior portion of the acromion was done, and  there was excellent access, good visualization.  This was carried from  lateral to medial down to the [facility] joint.  The acromion was decreased on  the lateral side of AC joint.  AC joint was opened and clearly seen.  There were bone spurs on the inferior surface of the clavicle, and this  was debrided with the high-speed bur.  Through an anterior portal, a  high-speed bur was then placed at the [facility] joint itself, and the joint was  entered.  A small portion of the acromion and AC joint was removed and a  portion of this and the clavicle was removed.  Again, the undersurface  of the clavicle had bone spurs which were removed.  The arthroscope was  then placed through the  anterior portal and directly down the tunnel  where the distal clavicle was resected.  Excellent decompression and  resection of clavicle was done.  Small bone spurs were removed, and the  dorsal capsule was maintained and preserved, and there was about a 6-mm  gap which was made between the distal clavicle and the acromion.  Technically, this went extremely well.  There was excellent  decompression of the entire surface of the acromion.  The surgical tools  were then removed and sutures used to close the skin wounds.  Sterile  dressing was applied, and the patient returned to the recovery room in  excellent condition.  Technically, I was extremely pleased with the  surgical procedure.   DISPOSITION:  1. Percocet for pain.  2. Usual postoperative instructions.  3. To my office on Wednesday.           ______________________________  Lenard Galloway. Chaney Malling, M.D.     RAM/MEDQ  D:  07/27/2006  T:  07/27/2006  Job:  [id number]

## 2010-06-25 ENCOUNTER — Other Ambulatory Visit: Payer: Self-pay

## 2010-06-25 MED ORDER — ALPRAZOLAM 0.5 MG PO TABS: 0.5000 mg | ORAL_TABLET | Freq: Every day | ORAL | Status: DC

## 2010-06-25 NOTE — Procedures (Signed)
NAME:  Renee Waters, Renee Waters NO.:  [id number]   MEDICAL RECORD NO.:  [id number]                   PATIENT TYPE:   LOCATION:                                       FACILITY:   PHYSICIAN:  Celene Kras, MD                     DATE OF BIRTH:  [birth year]   DATE OF PROCEDURE:  10/07/2003  DATE OF DISCHARGE:                                 OPERATIVE REPORT   HISTORY:  [facility].  I  evaluated her via the health and history form, with a 14-point review of  systems.  The patient has done very well with previous facetal injection in the  cervical spine, particularly to the right side.  She had this performed last  April, with good relief cycling to the suprascapular and levator scapular  regions, better range of motion, restorative sleep capacity and endurance.  I do not believe that further imaging or diagnostics are warranted.  Life  style enhancements are reviewed, as well as the necessity to maintain  contact with her primary care physician.  I will go ahead and follow up with her.  Today I think it is reasonable to  at least trial her with another facetal medial branch injection under local  anesthetic, to assess efficacy.  In followup, we will determine whether  further course of care will necessitate another injection, or even possibly  moving to radiofrequency neural ablation to minimize escalation of narcotic-  based pain medication and to improve function and quality of life and range  of motion.   PHYSICAL EXAMINATION:  Objectively, diffuse paracervical and myofascial  discomfort, with positive cervical facetal compression test, right greater  than left.  Suboccipital compression test is positive.  She has no new  neurological findings in the motor, sensory, or reflexes.   IMPRESSION:  1.  Cervicalgia.  2.  Degenerative spine disease of the cervical spine.  3.  Cervical facet syndrome.   PLAN:   Cervical facet medial branch injection, right side, at independent  needle access points at C4, C5, C6 and C7, under local anesthetic.  She has  consented.   DESCRIPTION OF PROCEDURE:  The patient is taken to the fluoroscopy suite and  placed in the supine position.  The neck prepped and draped in the usual  fashion.  Using a #25 gauge needle under local anesthetic, I advanced under  fluoroscopic observation in multiple fluoroscopic positions to the cervical  facet at the medial branch at C4, C5, C6 and C7, at independent needle  access points on the right side, contributory innervation addressed.  I  confirmed placement and then injected 0.5 mL of lidocaine and 1% MPF at each  level, with a total of 40 mg of Aristocort in divided dose.  The patient tolerated the procedure well.  Appropriate recovery.  Will assess in the  context of activities of daily living.  I will see her  followup.                                               Celene Kras, MD   HH/MEDQ  D:  10/07/2003 12:06:44  T:  10/07/2003 14:37:03  Job:  [id number]

## 2010-06-25 NOTE — Assessment & Plan Note (Signed)
Renee Waters is a 65 year old black female who is being followed in our pain  and rehabilitative clinic for headache and interscapular pain.   She has a history of a cervical fusion, C3 through C5, as well as a  diskectomy and has a history of lumbar spondylosis as well.   She reports her average pain has been about a 6 on a scale of 10, goes up to  a 7 and down to 5.   She verbalizes some frustration with her headache and interscapular pain.  Overall, she reports she is fairly good in the morning, but as the day wears  on, it gets worse and worse. She believes Flexeril might be giving her some  swollen eyelids which is a new phenomenon.   Denies any problems with bowel or bladder. Denies any new numbness or  tingling. Denies any new weakness. Denies any gait changes.   FUNCTIONAL STATUS:  Quite good. She is independent with feeding, dressing,  bathing, orofacial hygiene, and meal prep. She does some cooking. She is  independent with driving. She is currently not working. She enjoys  gardening. 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   Overall, she reports her sleep is poor.   She denies any harm to self or others. Does report some anxiousness.   Recent change in medical history, she may have a hernia in her left flank  area and may need to have it surgically repaired.   PHYSICAL EXAMINATION:  On exam today, she has a blood pressure of 133/71,  pulse 81, respirations 18, 99% saturated on room air. She is alert,  oriented, cooperative. Affect is overall bright interspersed with some  frustration regarding her headache and interscapular pain.   She has mild limitations with cervical range of motion. Her gait is  otherwise normal. Romberg's test is negative. Seated, her reflexes are 2+ at  biceps, triceps, brachial radialis; 1+ at patellar tendons and Achilles  tendons. Toes are downgoing. No clonus is noted. Motor strength at shoulder  abductors,  triceps, biceps, wrist extensors, finger flexors, and intrinsics  are 5/5. She has 5/5 strength at hip flexors, knee extensors, dorsi flexors,  plantar flexors, and EHL as well as knee flexors.   Gait is otherwise normal. Romberg's test is normal. She reports no numbness  with testing light touch in upper and lower extremities.   IMPRESSION:  1. Cervicalgia.  2. Status post C3 through C5 fusion and diskectomy with history of     degenerative disk disease.  3. Degenerative lumbar disk.  4. Anxiety.   PLAN:  Will refill Maxidone 10/750 one p.o. t.i.d. #90. Will add Ultracet 2  tablets p.o. b.i.d. #120. Will refill Elavil 10 mg 1 p.o. q.h.s. #31. Will  have her follow up with Dr. Stevphen Rochester for cervical facet medial branch blocks.  Will see her back in 1 month. She will discontinue the Flexeril as well.      Brantley Stage, M.D.   DMK/MedQ  D:  10/01/2003 17:13:31  T:  10/02/2003 09:47:22  Job #:  [id number]

## 2010-06-25 NOTE — Group Therapy Note (Signed)
FOLLOW UP:  Ms. Whiten is a 65 year old woman who is status post an anterior  cervical discectomy and fusion at C3/4 and C4/5.  Recently seen by Dr. Celene Kras who has done medial branch block at C4/5, C6, and C7.  Has been  currently been treated with Flexeril and Norco 10/325 mg q.i.d.  She reports  improvement in her pain.  Her average pain is approximately 3 on a scale of  10.  The worse pain has been up to a 4.  The pain she describes is mostly on  the right side of her neck with radiation intrascapular to about T8.   She has had a recent CT August 30, 2002 which showed moderate stenosis at  C3/4, C4/5.  She has also at C5/6 severe to moderate stenosis as well as  bilateral neuroforaminal narrowing at C5/6.  She has been on disability  since 1997.  She used to work as a [employer] operational officer.  She is currently able to meet  most of her functional needs and she reports she has learned to pace  herself.   Her pain does seem to increase somewhat when she has been at a desk for a  while or tries to read a book.  Last week, she had approximately three  episodes of headache-type pain and this week about three to four episodes.  Prior to her injections, she had pain pretty much every day and she is  fairly happy with the results of her injections.   SOCIAL HISTORY:  She is currently not interested in seeing any further  physical therapist.  She denies any harm to self or others.  She is married.  She lives with her husband and mother who is 45.  She denies alcohol,  tobacco, or illicit drug use.   FAMILY HISTORY:  Remarkable for her mother who is currently 50.  She has  hypertension, recent CVA, COPD, and asthma.  Father is deceased with unknown  medical history.  One sister who is 40 and healthy.   ALLERGIES:  No drug allergies known.   PAST MEDICAL HISTORY:  Remarkable for anxiety, panic attacks, OA,  hypertension, depression, and headaches.   PAST SURGICAL HISTORY:  1. Anterior cervical  discectomy and fusion at C3/4 and C4/5.  2. History of hysterectomy.   PHYSICAL EXAMINATION:  VITAL SIGNS:  Blood pressure was 125/77, pulse is 78,  respirations 16.  Saturations 99% on room air.  GENERAL:  She is well-developed, well-nourished woman in no apparent  distress.  She is appropriate and cooperative.  MUSCULOSKELETAL:  She has limited cervical mobility in all planes.  Shoulder  range of motion is functional.  SKIN:  Remarkable for a few tattoos.  HEART:  Regular rhythm.  LUNGS:  Clear.  ABDOMEN:  Benign.  NEUROLOGICAL:  Cranial nerves are grossly intact.  Sensory exam is negative  for any deficits with light touch.  Romberg's test is negative.  Gait is  normal.  She has normal tone in her upper and lower extremities.  Reflexes  are 2+ at the biceps, triceps, brachial radialis.  They are 2+ at the knees,  0 at the ankles bilaterally.  Motor strength is 5/5 in the shoulder  abductors, biceps, triceps, wrist extensors, finger flexors, and intrinsics.  They are 5/5 at the hip flexors, the extensors, dorsiflexors, plantar  flexors, and EHL.  She has some tenderness at approximately T8 with  palpation.   IMPRESSION:  1. Cervicalgia.  2. Degenerative disease of the  cervical spine with stenosis.  3. Status post discectomy and fusion cervical vertebrae-3 through cervical     vertebrae-5.  4. Degenerative joint disease of lumbar spine.  5. Anxiety disorder.   PLAN:  We discussed pacing activities and we discussed ergonomic setup of  her work station.  We also will start her on Lidoderm.  She was given a  prescription for #30.  We will start Elavil 1 p.o. q.h.s. #30 to help with  sleep.  We will continue Flexeril 10 mg 1 p.o. b.i.d. and Norco 10/325 mg 1  p.o. q.i.d. p.r.n. #120.  We will see her back then in one month and she  will also follow up with Dr. Celene Kras.     Brantley Stage, M.D.   DMK/MedQ  D:  05/07/2003 17:14:56  T:  05/07/2003 18:42:51  Job #:   [id number]

## 2010-06-25 NOTE — Discharge Summary (Signed)
NAME:  Renee Waters, Renee Waters                          ACCOUNT NO.:  [id number]   MEDICAL RECORD NO.:  [id number]                   PATIENT TYPE:  INP   LOCATION:  A310                                 FACILITY:  [facility]   PHYSICIAN:  Hanley Hays. Dechurch, M.D.           DATE OF BIRTH:  [birth year]   DATE OF ADMISSION:  06/22/2003  DATE OF DISCHARGE:  06/26/2003                                 DISCHARGE SUMMARY   DIAGNOSES:  1. Intractable back pain.  2. Hypertension.  3. Chronic pain disorder.  4. 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  5. Macrocytosis with normal B12, folate, iron studies, and thyroid     stimulating hormone.   Urinalysis had 7-10 white cells, many bacteria, but negative growth. Glucose  100.   Hemoglobin A1C was not performed.   PROCEDURE:  MRI of the lumbar spine which revealed multiple levels of  degenerative change including grade I anterolisthesis of L4-5, loss of disk  height L5-S1 with Modic-type marrow degeneration.  T-spine with small  right paracentral disk protrusion at T8, but no foraminal impingement.  Degenerative change in the mid cervical spine. No frank radiculopathy.   HOSPITAL COURSE:  A 65 year old healthy African American female followed by  Dr. Syliva Overman with a history of hypertension, chronic back issues,  who was actually relocated to this area in September 2004. She has been at  the pain clinic and underwent some injection therapy for her cervical pain  and actually had some improvement. However, two days prior to admission, the  patient was cleaning under her stove and apparently noted some sudden back  pain which progressively increased to the point that she could no longer  tolerate it. She presented to the emergency room where she was in moderate  distress secondary to pain. She was admitted to the hospital for further  evaluation. There was no evidence of neurologic compromise. She actually  improved with limited activity, IV Toradol,  and continuing on her Norco.  Flexeril was increased as well. After a long discussion with the patient she  had significant depression related to her chronic pain syndrome. She felt  quite frustrated in that she was not able to perform up to her expectations.  She is on Elavil which may help with her pain and may be considered to be  increased to see if it would help some of the depression if she tolerates  the side effects. This will be deferred to her primary care Josie Burleigh. It was  also elected to schedule the patient for evaluation by the spinal specialist  group at [facility] for opinion and monitoring over the course  of the patient's stay. She was also advised to continue with her pain clinic  appointments as there were plans for other procedures should she not get  good relief.  The patient was improved, but still having some discomfort  with getting about. She has a follow-up scheduled with  Dr. Lodema Hong early  next week who will also arrange the [facility] referral given her  precertification status with [facility].   MEDICATIONS:  At the time of discharge include Elavil 50 mg h.s., Soma 30 mg  at h.s. if needed, Benicar HCT 20/12.5 daily, Toradol 10 mg q.8h. p.r.n.  increased pain #20, Norco 10/325 q.6h. p.r.n. pain #60, Flexeril  10 mg q.8h. p.r.n. spasm.   The patient is instructed to call with questions or problems. She is being  discharged to home in stable condition. It should be noted her blood  pressures were well managed on Benicar alone during the hospital stay. She  was mildly hypokalemic and did receive some potassium supplement.  This will  need to be followed up as an outpatient. Potassium at the time of  presentation was 3.5.     ___________________________________________                                         Hanley Hays. Josefine Class, M.D.   FED/MEDQ  D:  06/26/2003  T:  06/26/2003  Job:  [id number]   cc:   Milus Mallick. Lodema Hong,  M.D.  [address]  [place], [place] [address]  Fax: [phone]

## 2010-06-25 NOTE — Op Note (Signed)
NAME:  Renee Waters, Renee Waters                          ACCOUNT NO.:  [id number]   MEDICAL RECORD NO.:  [id number]                   PATIENT TYPE:  AMB   LOCATION:  DAY                                  FACILITY:  [facility]   PHYSICIAN:  R. Roetta Sessions, M.D.              DATE OF BIRTH:  1945/12/07   DATE OF PROCEDURE:  DATE OF DISCHARGE:                                 OPERATIVE REPORT   PROCEDURE:  Esophagogastroduodenoscopy with biopsy followed by screening  colonoscopy.   ENDOSCOPIST:  Gerrit Friends. Rourk, M.D.   INDICATIONS FOR PROCEDURE:  The patient is a 65 year old African-American  female sent over at the courtesy of Dr. Syliva Overman for colorectal  cancer screening.  Dr. Lodema Hong has requested an EGD as well.  Renee Waters has  had some nonspecific abdominal pain, in fact, was admitted to the hospital a  month or so ago per her report.  Reportedly she has had a CT scan of the  abdomen and pelvis which demonstrated a fair amount of stool in the colon  and a possible cystocele, but no other significant abnormalities.  It has  been 10 years since she had a colonoscopy, and this was done for reasons,  not clear to me, but done around the time of her hysterectomy.  She does not  have any reflux symptoms, odynophagia, dysphagia, early satiety, nausea or  vomiting.  She has not had any melena or rectal bleeding.  There is no  family history of GI malignancy.  EGD and colonoscopy are now being done.  This approach has been discussed with the patient at length.  The potential risks, benefits, and alternatives have been reviewed;  questions answered.  Please see the documentation in the medical record for  more information.   PROCEDURE NOTE:  O2 saturation, blood pressure, pulse and respirations were  monitored throughout the entirety of both procedures.  Conscious sedation:  Versed 7 mg IV, Demerol 150 mg IV in divided doses.   INSTRUMENT:  Olympus video chip system.   FINDINGS:  Examination of  the tubular esophagus revealed a 3-cm tongue of  salmon-colored epithelium coming up above the EG junction.  The esophageal  mucosa otherwise appeared normal.  The EG junction was easily traversed.   STOMACH:  The gastric cavity was empty.  It insufflated well with air.  A  thorough examination of the gastric mucosa including a retroflex view of the  proximal stomach and esophagogastric junction demonstrated only a very tiny  hiatal hernia.  The pylorus was patent and easily traversed.  Examination of  the bulb and second portion revealed no abnormalities.   THERAPEUTIC/DIAGNOSTIC MANEUVERS:  The tongue of salmon-colored epithelium  was biopsied x2 for histologic study.   The patient tolerated the procedure well and was prepared for colonoscopy.  A digital rectal exam revealed no abnormalities.   ENDOSCOPIC FINDINGS:  The prep was good.   RECTUM:  Examination of the rectal mucosa including the retroflex view of  the anal verge revealed no abnormalities.   COLON:  The colonic mucosa was surveyed from the rectosigmoid junction  through the left transverse and right colon to the area of the appendiceal  orifice, ileocecal valve, and cecum.  These structures were well seen and  photographed for the record.  The terminal ileum was also intubated to 10  cm.   From this level the scope was slowly withdrawn.  All previously mentioned  mucosal surfaces were again seen.  The colonic mucosa and terminal ileum  appeared entirely normal.  The patient tolerated the procedures well and was  Renee Waters in endoscopy.   EGD IMPRESSION:  1. A 3-cm tongue of salmon-colored epithelium distal esophagus biopsied.  2. The remainder of the esophagus and stomach appeared normal except for a     very small hiatal hernia.  3. Normal D1-D2.   COLONOSCOPY FINDINGS:  1. Normal rectum.  2. Normal colon.  3. Normal terminal ileum.   RECOMMENDATIONS:  1. We will follow up on path.  2. We will plan to see  this Renee Waters back in the office in 1 month to see     how she is doing overall.      ___________________________________________                                            Jonathon Bellows, M.D.   RMR/MEDQ  D:  07/30/2003  T:  07/31/2003  Job:  [id number]   cc:   Milus Mallick. Lodema Hong, M.D.  [address]  [place], [place] [address]  Fax: [phone]   R. Roetta Sessions, M.D.  [address]    [place] [address]  Fax: [phone]

## 2010-06-25 NOTE — H&P (Signed)
NAME:  Renee Waters, Renee Waters                          ACCOUNT NO.:  [id number]   MEDICAL RECORD NO.:  [id number]                   PATIENT TYPE:  INP   LOCATION:  A310                                 FACILITY:  [facility]   PHYSICIAN:  Tesfaye D. Felecia Shelling, M.D.              DATE OF BIRTH:  May 26, 1945   DATE OF ADMISSION:  06/22/2003  DATE OF DISCHARGE:                                HISTORY & PHYSICAL   CHIEF COMPLAINT:  Back pain and spasm of one day duration.   HISTORY OF PRESENT ILLNESS:  This is a 65 year old female with a known case  of diskogenic disease and status post anterior cervical diskectomy with  fusion of C3-C4, C4-C5.  The patient has been followed in the pain center.  She was recently seen on May 30 2003, and her pain medicine has been  adjusted  The patient was also getting muscle relaxants; however, the  patient claimed that she had a severe pain since early this morning,  especially in her cervical and lower lumbar area.  The patient radiates  towards her anterior abdomen and the chest area.  She came to the emergency  room where she was evaluated.  She was given several doses of IV narcotic  pain medicine, as well as a muscle relaxant; however, her pain remained  unrelieved with treatment.  The patient is admitted for pain management.   REVIEW OF SYSTEMS:  The patient has no fever, headache, chest pain, nausea,  vomiting, abdominal pain, dysuria, urgency, or frequency of urination.   PAST MEDICAL HISTORY:  1. Diskogenic disease.  2. Status post anterior cervical diskectomy and fusion at C3-C4, and C5-C6.  3. Hypertension.  4. Anxiety disorder.   CURRENT MEDICATIONS:  1. Norco 10/325, one tablet p.o. q.i.d.  2. Elavil.  3. Flexeril.  4. Lidoderm.   SOCIAL HISTORY:  The patient is married.  She is currently a housewife.  The  patient smokes about 1 cigarette per day for the last several years.  No  history of alcohol or substance abuse.   PHYSICAL EXAMINATION:   GENERAL:  The patient is alert, awake, sick looking.  VITAL SIGNS:  Blood pressure 145/106, pulse 84, respiratory rate 22,  temperature 98 degrees Fahrenheit.  HEENT:  Pupils are equal and reactive.  There is a spasm of the posterior  neck muscle.  CHEST:  Clear.  Good air entry.  CARDIOVASCULAR:  First and second sounds heard.  No murmur, no gallop.  ABDOMEN:  Soft, and lax, bowel sound is positive.  BACK:  The patient is tender on patting her lumbar area.  There is no  swelling or deformity.  EXTREMITIES:  No leg edema.  NEUROLOGIC:  There is no neurological deficit.   LABORATORIES ON ADMISSION:  WBC 4.2, hemoglobin 13.9, hematocrit 40.1,  platelets 192.  Sodium 137, potassium 3.5, chloride 104.  Carbon dioxide 35,  glucose 100, BUN 15, creatinine 0.7, calcium  9.4.  Urinalysis with specific  gravity of 1.020, pH 8.5, WBC's 2-10.   ASSESSMENT:  This is a 65 year old female patient with a history of  diskogenic disease and is status post lumbar diskectomy with fusion surgery,  who came in with spasm and pain of the lumbar and cervical area.  The  patient was given several doses of IV pain medications; however, her  symptoms continued to persist.  The patient is currently admitted for pain  management.   PLAN:  Will continue the patient on Flexeril 10 mg p.o. t.i.d.  Will give  her Dilaudid 2 mg IV q.4h.  Will continue patient on hydration.  Will start  her also on hydrochlorothiazide with potassium for her high blood pressure.     ___________________________________________                                         Eustaquio Maize. Felecia Shelling, M.D.   TDF/MEDQ  D:  06/22/2003  T:  06/22/2003  Job:  [id number]

## 2010-06-25 NOTE — Procedures (Signed)
NAME:  [name], [name] NO.:  [id number]   MEDICAL RECORD NO.:  [id number]                   PATIENT TYPE:  REC   LOCATION:  TPC                                  FACILITY:  [facility]   PHYSICIAN:  Celene Kras, MD                     DATE OF BIRTH:  12/22/1955   DATE OF PROCEDURE:  05/20/2003  DATE OF DISCHARGE:                                 OPERATIVE REPORT   HISTORY OF PRESENT ILLNESS:  [facility] to evaluate her __________ 14 point review of systems.   #1 - We are seeing about a 15% diminish in her pain perception after  previous facet medial branch injection.  She has had improved range of  motion in functional indices and less myofascial pain.   #2 - We talked about holding off another injection, but she requests this,  as she is starting to breakthrough notably with decreased restorative sleep  capacity and overall function.  Our goal is to minimize escalation and  narcotic based pain medication.   FOLLOWUP:  We will follow up with her p.r.n.  Should she recrudesce, I think  it is reasonable to consider radiofrequency neural ablation.  She has done  very well.  She wants to remain as functional as possible and finds that  this injection has led with good relief cycling.   OBJECTIVE:  Diffuse paracervical myofascial discomfort, positive cervical  facetal compression test, right greater than left, suboccipital compression  test positive.  No neurological findings, motor, sensory reflexes.   IMPRESSION:  Cervicalgia and spinal disease of the cervical spine.  Cervical  facet syndrome.   PLAN:  Cervical facet and medial branch injection at C4, C5, C6, C7 at right  side, independent needle access point.  She has consented.   DESCRIPTION OF PROCEDURE:  The patient was taken to the fluoroscopy suite,  placed in the supine position.  Neck prepped and draped in usual fashion.  Using a 25 gauge needle, I  advanced to the facet medial branch at C4, C5, C6  and C7 independent needle access points, right side.  Confirmed placement of  multiple fluoroscopic position.  I injected 0.5 cc of lidocaine 1% MPF at  each level with a total of 40 mg of Aristocort in divided dose.   The patient tolerated this procedure well.  No complications from our  procedure.  Discharge instructions given.  Seen in follow up.                                                Celene Kras, MD    HH/MEDQ  D:  05/20/2003 08:48:04  T:  05/20/2003 82:95:62  Job:  [id number]

## 2010-06-25 NOTE — Assessment & Plan Note (Signed)
REFERRAL SOURCE:  Dr. Syliva Overman   CHIEF COMPLAINT:  Neck pain.   HISTORY:  Renee Waters is a 65 year old woman who is status post an anterior  cervical diskectomy and fusion at C3-4 and C4-5.  She was followed by Dr.  Stevphen Rochester who did a medial branch block.  Her pain has currently been  controlled on Norco 10/325 q.i.d., Elavil, Flexeril and Lidoderm.   She is back in for a recheck today.  She tells me she has been staying quite  active. She has been gardening quite a bit, cooking and walking.  She spends  about 3 hours a day walking.  She tells me she still has some mild pain in  her neck, it is not severe.  Her average pain is about a 3, worst pain is  about a 6 and it can go down to about a 1.   She denies any problems with numbness, tingling, weakness, bowel or bladder  problems, no balance or gait problems.   PHYSICAL EXAMINATION:  GENERAL:  She is alert, cooperative, appropriate and  bright.  VITAL SIGNS:  Blood pressure is 149/83, pulse 78, 98% saturated on room air.  HEART:  Regular rhythm.  LUNGS:  Clear.  ABDOMEN:  Benign.  EXTREMITIES:  Reveal normal tone, no tremors noted, coloration is normal, no  cyanosis or erythema.  No edema noted.  No tenderness.  NEUROMUSCULAR EXAM:  She has a fairly good range of motion with flexion  extension.  She does have limitations with rotation right and left as well  as right and left lateral flexion.   Seated, reflexes are 2+ at the biceps, triceps, brachioradialis, 2+ at the  knees and ankles.  Toes are down going, no clonus is noted.  No sensory  deficits are noted with light touch or pink prick.  Motor strength is 5/5 at  the shoulder abductors, biceps, triceps, wrist extensors, finger flexors and  intrinsics, 5/5 at the hip flexors, knee extensors, dorsiflexors, plantar  flexors and EHL.  Romberg's test is negative.  Her gait is normal.   IMPRESSION:  1. Cervicalgia.  2. Degenerative disk disease at the cervical spine with  stenosis.  3. Status post diskectomy and fusion at C3 through C5.  4. Degenerative disk disease of the lumbar spine.  5. Anxiety disorder.   PLAN:  Will refill patient's Elavil 10 mg 1 p.o. q.h.s. #30 refill x2.  Flexeril 10 mg 1 p.o. b.i.d. p.r.n. #60 refill x2.  She will call us when  she needs another prescription for Norco (this was recently filled on May 07, 2003 and she does not need another prescription at this time she tells  me).   We will see her back in approximately 2 to 3 months.  She will let us know  if she has any trouble in between, we of course would be happy to see her  earlier.      Renee Waters, M.D.   DMK/MedQ  D:  05/30/2003 16:53:32  T:  05/30/2003 19:26:44  Job #:  [id number]   cc:   Milus Mallick. Lodema Hong, M.D.  [address]  [place], [place] [address]  Fax: [phone]

## 2010-06-25 NOTE — Procedures (Signed)
NAME:  [name], [name] NO.:  [id number]   MEDICAL RECORD NO.:  [id number]          PATIENT TYPE:  REC   LOCATION:  TPC                          FACILITY:  [facility]   PHYSICIAN:  Renee Kras, MD        DATE OF BIRTH:  03-Sep-1945   DATE OF PROCEDURE:  11/04/2003  DATE OF DISCHARGE:                                 OPERATIVE REPORT   PATIENT:  Renee Waters.   DATE OF BIRTH:  1945-08-20.   SURGEON:  Renee Waters. Renee Waters, M.D.   [facility] and I evaluated  her. I have reviewed the Health and history form. I reviewed the 14 point  review of systems.   1.  Renee Waters had a good response from the first facetal injection, the      second one was equivocal. I planned to go ahead and proceed with RF, but      she is actually presenting with a bilateral presentation today and a      modest radicular component consistent with C5-6, 6-7. To this end, prior      to RF, it is reasonable to inject the translaminar space as she has      need, broad based prospective of pain, poor response to last facetal      injection, and I do not think we should go ahead with RF at this time.      We will rethink this based on the results of the translaminar injection      and she how she does.  2.  I will renew her medications with cautions given.  3.  Other lifestyle enhancements reviewed.  4.  Home based therapy reviewed.   She will assess this within the context of activities of daily living. Will  follow up in consultation, and determine further course of care. I do not  think further imaging or diagnostics are warranted at this time with the  exception of nerve conduction studies with advancing radicular component.  She has no advancing neurological changes, no myelopathy.   OBJECTIVE:  Diffuse paracervical myofascial discomfort, right and left,  suprascapular/levatoscapular pain with paracervical discomfort. Suboccipital  compression  test positive. Positive cervical facetal compression test, right  greater than left. No new neurological findings motor, sensory or reflexive.   IMPRESSION:  Cervicalgia, degenerative spine disease of cervical spine,  cervical facet syndrome.   PLAN:  Cervical epidural. She is consented.   The patient taken to the procedure area, placed in appropriate. Neck prepped  and draped in the usual fashion. Using a Hustead needle, I advanced to the  C6-7 interspace without any evidence of CSF, heme, or paresthesia.   Test block uneventfully, then injected 40 mg of Aristocort uneventfully.  This was performed under local anesthetic.   Tolerated the procedure well. No complications from procedure. Appropriate  recovery. Discharge instructions given.       HH/MEDQ  D:  11/04/2003 10:33:59  T:  11/05/2003 07:12:11  Job:  [id number]
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
## 2010-06-25 NOTE — Procedures (Signed)
NAME:  GESSELLE, FITZSIMONS NO.:  [id number]   MEDICAL RECORD NO.:  [id number]                   PATIENT TYPE:  REC   LOCATION:  [facility]                                  FACILITY:  [facility]   PHYSICIAN:  Celene Kras, MD                     DATE OF BIRTH:  12/22/1955   DATE OF PROCEDURE:  04/08/2003  DATE OF DISCHARGE:                                 OPERATIVE REPORT   REFERRING PHYSICIAN:  Milus Mallick. Lodema Hong, M.D.   Monisha Siebel [facility] and is noting  some improvement, improved range of motion and decreased medication usage.  She notes she is still having some difficulty with restorative sleep  capacity, some cramping, and endurance.  I will go ahead and proceed with  another cervical facetal injection, as positive provocative block leads to  consideration of radiofrequency neural ablation.  I do not necessarily plan  another injection.  I would like to see how she dose, and have her follow up  with Dr. Pamelia Hoit for further definitive assessment from our  rehabilitation colleagues.  Should she break through, but notice a  significant improvement, would probably go ahead with RF, but this will be  modeled after our follow-up appointment, and patient's wishes.  Perhaps just  this injection would be enough, as I am seeing improvement here, and from  time to time just reinforce.   OBJECTIVE:  Diffuse paracervical myofascial discomfort, impaired flexion,  extension and lateral rotation.  Pain on positive cervical facetal  compression test, right greater than left. Suboccipital compression test  positive.  She has no new neurological findings, motor, sensory or reflexes.   IMPRESSION:  Cervicalgia, degenerative spine disease cervical spine.  Cervical facet syndrome.   PLAN:  Cervical facet medial branch injection, right side, at C4, 5, 6, and  7.  Independent needle access points.  She has consented.   The patient taken to  the procedure area, placed in supine position, neck  prepped and draped in usual fashion.  Using a 25-gauge needle, I advanced  the cervical facet medial branch at C4, 5, 6, and 7 at independent needle  access points, right side.  I confirmed placement in multiple fluoroscopic  positions.  I then inject 0.5 mL of lidocaine 1% NPF at each level with a  total of 40 mg Aristocort in divided doses.  She tolerated this procedure  well with no complications from our procedure.  Appropriate recovery.  Discharge instructions given.  Lifestyle enhancements reviewed.  We are very  pleased that she has quit smoking and is involved in other enhancements  here.  Review of medication.  I will go ahead and give her some Flexeril at  h.s. to help with myofascial pain and restorative sleep capacity, and  discontinue the Soma.  I will give her a few hydrocodone to take for  breakthrough pain but to rely on  non-narcotic medication alternatives, and this is discussed with her.  Discharge instructions given with appropriate recovery and improvement at  discharge.                                                Celene Kras, MD    HH/MEDQ  D:  04/08/2003 09:20:44  T:  04/08/2003 09:50:47  Job:  [id number]   cc:   Milus Mallick. Lodema Hong, M.D.  [address]  [place], [place] [address]  Fax: [phone]

## 2010-06-25 NOTE — Assessment & Plan Note (Signed)
MEDICAL RECORD NUMBER:  [id number]   [name] is a 65 year old black female who has a history of a C3-5  fusion and diskectomy.  She also has a history of lumbar spondylosis.   [name] tells me she has been hospitalized within the last month at [facility] for some left flank pain.  She had several tests done to  apparently look at possible cardiac reasons.  She has also had an MRI of her  spine, which was done on Jun 23, 2003.  She brings in a report with that.  The report is in the chart.  It was noted that she has some moderate  paracentral and foraminal disk extrusion at L4-5 displacing L4-5 nerve root.  She also had a thoracic MRI done which showed shallow protrusions at T7-8  and T9-10 without displacement of the thoracic cord.  She also has some  degenerative changes noted in the mid cervical spine.   She is planning to undergo endoscopy and colonoscopy to rule out some  gastritis or other gastric problems with in the next few weeks as well.  Her  main complaints today are the mid back pain and low back pain.  She  describes it on the average of 4 on a scale of 10.  The least is about a 3.  The worst is about a 5.  She has been taking hydrocodone 10/325 mg pills and  using Lidoderm patches and Flexeril.  She reports that she would like to  increase the acetaminophen and her Norco over the next month while she is in  flare up.  She finds the Flexeril not at all helpful and she would like to  discontinue that.  She is finding Elavil quite helpful, as well as the  Lidoderm.   [name] stay quite busy.  She has been doing some gardening.  She does  cooking and laundry.  She also has a 21 year old mother who she takes care  of in her home.  She is required to assist her when she gets in and out of  the car.   MEDICATIONS:  Medications at this time include the following:  1. Xanax 0.25 mg b.i.d.  2. Multivitamins.  3. Norco 10/325 mg.  4. Elavil 10 mg q.h.s.  5. Lidoderm patch.   PHYSICAL EXAMINATION:  VITAL SIGNS:  On exam, she was noted to have a blood  pressure of 112/68, pulse 97, respirations 20, and 99% saturated on room  air.  GENERAL APPEARANCE:  She is alert, cooperative, and appropriate in the room  today.  HEART:  In regular rhythm.  LUNGS:  Clear.  ABDOMEN:  Bowel sounds are positive.  Benign.  NEUROLOGIC:  She has limitations in her cervical range of motion of about 45  degrees with lateral rotation bilaterally and limited lateral flexion  bilaterally.  Her gait is otherwise normal.  She is able to flex forward to  approximately 90 degrees.  She reports pain with returning back to a neutral  position.  Sensory exam is intact with light touch and pinprick.  She also  has intact position and vibratory sense.  Reflexes are 2+ throughout both  upper extremities at biceps, triceps, and brachial radialis, 2+ at the knees  bilaterally, and 0 at the ankles bilaterally.  Toes are downgoing.  No  clonus is noted.  She has 5/5 motor strength in the upper extremities, as  well as in the lower extremities, including hip flexors, knee extensors,  dorsiflexors, plantar flexors, and EHL.  She reports decreased pinprick  sensation at about the T8 or T9 level over her left flank.   IMPRESSION:  1. Cervicalgia.  2. Status post C3-5 fusion and diskectomy with history of degenerative disk     disease.  3. Degenerative lumbar disk disease.  4. Anxiety.   PLAN:  1. Will increase the patient's acetaminophen and hydrocodone.  Will write     her a prescription for hydrocodone 10/750 mg one p.o. q.i.d. p.r.n.  2. Will have her continue Lidoderm patch, 12 hours on and 12 hours off, one     to three patches to use as directed.  3. Will continue the Elavil 10 mg one p.o. q.h.s.  4. Will discontinue the Flexeril.  5. May consider a nonsteroidal anti-inflammatory medication if her endoscopy     is clear for any gastritis or ulcerative-type disease.   6. Would not like to keep her on the 750 mg dose of acetaminophen for more     than a month.  7. Will see Ms. Barnfield back in one month's time.      Brantley Stage, M.D.   DMK/MedQ  D:  07/16/2003 12:27:54  T:  07/16/2003 13:07:10  Job #:  [id number]   cc:   Milus Mallick. Lodema Hong, M.D.  [address]  [place], [place] [address]  Fax: [phone]

## 2010-07-06 ENCOUNTER — Other Ambulatory Visit: Payer: Self-pay

## 2010-07-06 ENCOUNTER — Encounter: Payer: Self-pay | Admitting: Family Medicine

## 2010-07-06 MED ORDER — HYDROCODONE-ACETAMINOPHEN 10-325 MG PO TABS: 1.0000 | ORAL_TABLET | Freq: Four times a day (QID) | ORAL | Status: AC | PRN

## 2010-07-07 ENCOUNTER — Encounter: Payer: Self-pay | Admitting: Family Medicine

## 2010-07-07 ENCOUNTER — Ambulatory Visit (INDEPENDENT_AMBULATORY_CARE_PROVIDER_SITE_OTHER): Payer: BC Managed Care – PPO | Admitting: Family Medicine

## 2010-07-07 VITALS — BP 110/74 | HR 69 | Resp 16 | Ht 65.0 in | Wt 140.1 lb

## 2010-07-07 DIAGNOSIS — K219 Gastro-esophageal reflux disease without esophagitis: Secondary | ICD-10-CM

## 2010-07-07 DIAGNOSIS — M1990 Unspecified osteoarthritis, unspecified site: Secondary | ICD-10-CM

## 2010-07-07 DIAGNOSIS — A6000 Herpesviral infection of urogenital system, unspecified: Secondary | ICD-10-CM

## 2010-07-07 DIAGNOSIS — I10 Essential (primary) hypertension: Secondary | ICD-10-CM

## 2010-07-07 DIAGNOSIS — M549 Dorsalgia, unspecified: Secondary | ICD-10-CM

## 2010-07-07 MED ORDER — NAPROXEN-ESOMEPRAZOLE 500-20 MG PO TBEC: 1.0000 | DELAYED_RELEASE_TABLET | Freq: Two times a day (BID) | ORAL | Status: DC

## 2010-07-07 NOTE — Patient Instructions (Addendum)
F/U in 4 months.  New medication for pain  will be started next month per your request, pls call approx 5 days before to remind [place]. You need to sign a narcotic contract.  Vimovo is sent to your pharmacy

## 2010-07-07 NOTE — Progress Notes (Signed)
  Subjective:    Patient ID: Renee Waters, female    DOB: 29-Mar-1945, 65 y.o.   MRN: [id number]  HPI  Larey Seat and fractured the right wrist while escapig a snake 72m 06/10/2010, ortho has it in a cast. Reports uncontrolled arthritic pain, both knees her left hip and neck, states wants trial of different pain med, thinks she has become immune to the current since been on it for years. Will start new med  in next 30 days.Requests resuming vimovo, and to d/c the aciphex  Review of Systems    Denies recent fever or chills. Denies sinus pressure, nasal congestion, ear pain or sore throat. Denies chest congestion, productive cough or wheezing. Denies chest pains, palpitations, paroxysmal nocturnal dyspnea, orthopnea and leg swelling Denies abdominal pain, nausea, vomiting,diarrhea or constipation.  Denies rectal bleeding or change in bowel movement. Denies dysuria, frequency or  hesitancy she is on med for incontinence.no recent herpes outbreak  Denies headaches, seizure, numbness, or tingling. Denies depression, anxiety or insomnia.Medication is working well for her Denies skin break down or rash.     Objective:   Physical Exam Patient alert and oriented and in no Cardiopulmonary distress.  HEENT: No facial asymmetry, EOMI, no sinus tenderness, TM's clear, Oropharynx pink and moist.  Neck decreased  no adenopathy.  Chest: Clear to auscultation bilaterally.  CVS: S1, S2 no murmurs, no S3.  ABD: Soft non tender. Bowel sounds normal.  Ext: No edema  MS: decreased ROM spine, shoulders, hips and knees.  Skin: Intact, no ulcerations or rash noted.  Psych: Good eye contact, normal affect. Memory intact not anxious or depressed appearing.  CNS: CN 2-12 intact,      Assessment & Plan:

## 2010-07-09 HISTORY — PX: OTHER SURGICAL HISTORY: SHX169

## 2010-07-09 HISTORY — PX: HERNIA REPAIR: SHX51

## 2010-07-13 ENCOUNTER — Other Ambulatory Visit: Payer: Self-pay

## 2010-07-13 MED ORDER — TEMAZEPAM 30 MG PO CAPS: 30.0000 mg | ORAL_CAPSULE | Freq: Every evening | ORAL | Status: DC | PRN

## 2010-07-18 ENCOUNTER — Encounter: Payer: Self-pay | Admitting: Family Medicine

## 2010-07-18 NOTE — Assessment & Plan Note (Signed)
Controlled, no change in medication  

## 2010-07-18 NOTE — Assessment & Plan Note (Signed)
Controlled, pt will change to vimovo from aciphex, states this works well for her

## 2010-07-18 NOTE — Assessment & Plan Note (Signed)
Uncontrolled pain , will change to tylox when next due to collect pain med

## 2010-07-30 ENCOUNTER — Telehealth: Payer: Self-pay | Admitting: Family Medicine

## 2010-07-30 MED ORDER — HYDROCODONE-ACETAMINOPHEN 10-325 MG PO TABS: 1.0000 | ORAL_TABLET | Freq: Four times a day (QID) | ORAL | Status: AC | PRN

## 2010-07-30 NOTE — Telephone Encounter (Signed)
Hydrocodone refilled per patient request, will be changing to new pain med next month

## 2010-07-30 NOTE — Telephone Encounter (Signed)
Patient states she will take the hydocodone for one more month because she is out now and cant wait until Monday but next month please change med.

## 2010-08-01 ENCOUNTER — Other Ambulatory Visit: Payer: Self-pay | Admitting: Family Medicine

## 2010-08-01 NOTE — Telephone Encounter (Signed)
Pt will need scrip for tylox i four times daily for July 20, she will need to collect the script that  Week, med will be entered historically

## 2010-08-12 ENCOUNTER — Other Ambulatory Visit: Payer: Self-pay | Admitting: Family Medicine

## 2010-08-18 ENCOUNTER — Other Ambulatory Visit: Payer: Self-pay | Admitting: Family Medicine

## 2010-08-23 ENCOUNTER — Telehealth: Payer: Self-pay | Admitting: Family Medicine

## 2010-08-23 MED ORDER — OXYCODONE-ACETAMINOPHEN 5-500 MG PO CAPS: 1.0000 | ORAL_CAPSULE | Freq: Four times a day (QID) | ORAL | Status: DC | PRN

## 2010-08-23 NOTE — Telephone Encounter (Signed)
Yes and new script has been written

## 2010-08-23 NOTE — Telephone Encounter (Signed)
Wants to know if pain med could be changed this week?

## 2010-08-25 ENCOUNTER — Telehealth: Payer: Self-pay | Admitting: Family Medicine

## 2010-08-25 NOTE — Telephone Encounter (Signed)
Script is available, called patient, no answer

## 2010-08-25 NOTE — Telephone Encounter (Signed)
Patient is aware 

## 2010-09-13 ENCOUNTER — Other Ambulatory Visit: Payer: Self-pay

## 2010-09-13 ENCOUNTER — Telehealth: Payer: Self-pay | Admitting: Family Medicine

## 2010-09-13 MED ORDER — TEMAZEPAM 30 MG PO CAPS: 30.0000 mg | ORAL_CAPSULE | Freq: Every evening | ORAL | Status: DC | PRN

## 2010-09-13 NOTE — Telephone Encounter (Signed)
Temazepam sent in.  

## 2010-09-17 ENCOUNTER — Other Ambulatory Visit: Payer: Self-pay

## 2010-09-17 MED ORDER — OXYCODONE-ACETAMINOPHEN 5-500 MG PO CAPS: 1.0000 | ORAL_CAPSULE | Freq: Four times a day (QID) | ORAL | Status: DC | PRN

## 2010-09-22 ENCOUNTER — Other Ambulatory Visit: Payer: Self-pay

## 2010-09-22 MED ORDER — ALPRAZOLAM 0.5 MG PO TABS: 0.5000 mg | ORAL_TABLET | Freq: Every day | ORAL | Status: DC

## 2010-09-27 ENCOUNTER — Encounter: Payer: Self-pay | Admitting: Family Medicine

## 2010-09-27 ENCOUNTER — Ambulatory Visit (INDEPENDENT_AMBULATORY_CARE_PROVIDER_SITE_OTHER): Payer: BC Managed Care – PPO | Admitting: Family Medicine

## 2010-09-27 ENCOUNTER — Telehealth: Payer: Self-pay | Admitting: Family Medicine

## 2010-09-27 VITALS — BP 170/94 | HR 81 | Resp 16 | Ht 65.0 in | Wt 135.0 lb

## 2010-09-27 DIAGNOSIS — A6000 Herpesviral infection of urogenital system, unspecified: Secondary | ICD-10-CM

## 2010-09-27 DIAGNOSIS — M1990 Unspecified osteoarthritis, unspecified site: Secondary | ICD-10-CM

## 2010-09-27 DIAGNOSIS — I10 Essential (primary) hypertension: Secondary | ICD-10-CM

## 2010-09-27 DIAGNOSIS — K219 Gastro-esophageal reflux disease without esophagitis: Secondary | ICD-10-CM

## 2010-09-27 MED ORDER — CLONIDINE HCL 0.3 MG PO TABS: 0.3000 mg | ORAL_TABLET | Freq: Two times a day (BID) | ORAL | Status: DC

## 2010-09-27 MED ORDER — CITALOPRAM HYDROBROMIDE 20 MG PO TABS: ORAL_TABLET | ORAL | Status: DC

## 2010-09-27 MED ORDER — CYCLOBENZAPRINE HCL 10 MG PO TABS: ORAL_TABLET | ORAL | Status: DC

## 2010-09-27 NOTE — Telephone Encounter (Signed)
Needs ov for further med changes in pain med, pls have her sched a work in appt with me

## 2010-09-27 NOTE — Telephone Encounter (Signed)
Patient aware and will schedule appt

## 2010-09-27 NOTE — Progress Notes (Signed)
  Subjective:    Patient ID: Renee Waters, female    DOB: 06-Jan-1946, 65 y.o.   MRN: [id number]  HPI Pt reports continued instatbility and repeated falls due to right hip pain  Which was replaced  In 07/2009, states she was told there was nothing with the joint itself. Reports falling just while standing cutting petals. Pt states tylox  Irritate sher stomach, increased acid reflux symptoms  Review of Systems See HPI Denies recent fever or chills. Denies sinus pressure, nasal congestion, ear pain or sore throat. Denies chest congestion, productive cough or wheezing. Denies chest pains, palpitations and leg swelling Denies  vomiting,diarrhea or constipation.   Denies dysuria, frequency, hesitancy or incontinence. Denies headaches, seizures, numbness, or tingling. Denies depression, anxiety or insomnia. Denies skin break down or rash.        Objective:   Physical Exam Patient alert and oriented and in no cardiopulmonary distress.  HEENT: No facial asymmetry, EOMI, no sinus tenderness,  oropharynx pink and moist.  Neck supple no adenopathy.  Chest: Clear to auscultation bilaterally.  CVS: S1, S2 no murmurs, no S3.  ABD: Soft mild epigastric tendeness,. Bowel sounds normal.  Ext: No edema  MS: decreased ROM spine adequate  In  shoulders, hips and knees.  Skin: Intact, no ulcerations or rash noted.  Psych: Good eye contact, normal affect. Memory intact not anxious or depressed appearing.  CNS: CN 2-12 intact, power, tone and sensation normal throughout.        Assessment & Plan:

## 2010-09-27 NOTE — Telephone Encounter (Signed)
Patient states she cannot take the oxycodone, gives her heartburn Patient states she needs a stronger dose of codeine and and smaller dose of acetaminophen

## 2010-09-27 NOTE — Patient Instructions (Signed)
F/u in October as before.  Discontinue tylox, since you cannot tolerate this.  Resume hydrocodone as before, nmedication is sent to your pharmacy.  PLS take clonidine TWICE daily as prescribed, 9am and 9pm, your blood pressure is HIGH

## 2010-09-28 ENCOUNTER — Other Ambulatory Visit: Payer: Self-pay

## 2010-09-28 MED ORDER — HYDROCODONE-ACETAMINOPHEN 10-325 MG PO TABS: 1.0000 | ORAL_TABLET | Freq: Four times a day (QID) | ORAL | Status: AC | PRN

## 2010-10-03 NOTE — Assessment & Plan Note (Signed)
Controlled, no change in medication  

## 2010-10-03 NOTE — Assessment & Plan Note (Addendum)
Un Controlled, no change in medication, associated with tylox , which is discontinued

## 2010-10-03 NOTE — Assessment & Plan Note (Signed)
Uncontrolled on current regime , medication change made

## 2010-10-03 NOTE — Assessment & Plan Note (Signed)
Uncontrolled. Medication compliance addressed. Commitment to regular exercise, and healthy  eating habits with portion control discussed. DASH diet, and low fat diet discussed, and literature offered. No changes in medication at this time.  

## 2010-10-04 ENCOUNTER — Other Ambulatory Visit: Payer: Self-pay | Admitting: Family Medicine

## 2010-10-20 ENCOUNTER — Telehealth: Payer: Self-pay | Admitting: Family Medicine

## 2010-10-20 NOTE — Telephone Encounter (Signed)
NOT ON MED LIST

## 2010-10-20 NOTE — Telephone Encounter (Signed)
Unless she had a severe reaction to the bee sting or something else which was "life threatening"  And has been advised in the past , eg by allergist to have epi pen, no need to get script for this, use benadryl for local reaction. Discuss with her , let me know if any further request

## 2010-10-21 NOTE — Telephone Encounter (Signed)
Patient aware.

## 2010-11-04 ENCOUNTER — Encounter: Payer: Self-pay | Admitting: Internal Medicine

## 2010-11-05 ENCOUNTER — Encounter: Payer: Self-pay | Admitting: Family Medicine

## 2010-11-08 ENCOUNTER — Encounter: Payer: Self-pay | Admitting: Family Medicine

## 2010-11-08 ENCOUNTER — Ambulatory Visit (INDEPENDENT_AMBULATORY_CARE_PROVIDER_SITE_OTHER): Payer: BC Managed Care – PPO | Admitting: Family Medicine

## 2010-11-08 VITALS — BP 128/80 | HR 81 | Resp 16 | Ht 65.0 in | Wt 141.8 lb

## 2010-11-08 DIAGNOSIS — I10 Essential (primary) hypertension: Secondary | ICD-10-CM

## 2010-11-08 DIAGNOSIS — M549 Dorsalgia, unspecified: Secondary | ICD-10-CM

## 2010-11-08 DIAGNOSIS — A6000 Herpesviral infection of urogenital system, unspecified: Secondary | ICD-10-CM

## 2010-11-08 DIAGNOSIS — R5381 Other malaise: Secondary | ICD-10-CM

## 2010-11-08 DIAGNOSIS — Z23 Encounter for immunization: Secondary | ICD-10-CM

## 2010-11-08 DIAGNOSIS — Z1211 Encounter for screening for malignant neoplasm of colon: Secondary | ICD-10-CM

## 2010-11-08 DIAGNOSIS — F329 Major depressive disorder, single episode, unspecified: Secondary | ICD-10-CM

## 2010-11-08 DIAGNOSIS — Z139 Encounter for screening, unspecified: Secondary | ICD-10-CM

## 2010-11-08 DIAGNOSIS — K219 Gastro-esophageal reflux disease without esophagitis: Secondary | ICD-10-CM

## 2010-11-08 DIAGNOSIS — E785 Hyperlipidemia, unspecified: Secondary | ICD-10-CM

## 2010-11-08 DIAGNOSIS — M1990 Unspecified osteoarthritis, unspecified site: Secondary | ICD-10-CM

## 2010-11-08 NOTE — Patient Instructions (Signed)
CPE end November.  LABWORK  NEEDS TO BE DONE BETWEEN 3 TO 7 DAYS BEFORE YOUR NEXT SCEDULED  VISIT.  THIS WILL IMPROVE THE QUALITY OF YOUR CARE. Fasting labs 1 week before f/u  Flu vaccine today.  Blood pressure is excellent, pLS continue to take the medication as you are doing.  Mammogram due Nov 19 or after , pls schedule.  Pls schedule appt with dr Jena Gauss to review your GI issues, per the letter sent

## 2010-11-08 NOTE — Assessment & Plan Note (Signed)
Controlled, no change in medication  

## 2010-11-09 NOTE — Assessment & Plan Note (Signed)
Unchanged, continue current medications 

## 2010-11-09 NOTE — Assessment & Plan Note (Signed)
Controlled, no change in medication  

## 2010-11-09 NOTE — Assessment & Plan Note (Signed)
Maintained on daily antiviral to reduce flares

## 2010-11-09 NOTE — Progress Notes (Signed)
  Subjective:    Patient ID: Renee Waters, female    DOB: [birth year], 65 y.o.   MRN: [id number]  HPI The PT is here for follow up and re-evaluation of uncontrolled hypertension, and other medical conditions, medication management and review of any available recent lab and radiology data.  Preventive health is updated, specifically  Cancer screening and Immunization.   Last week she had exploration of the wound on her right thigh where she had continually complained of feeling of discomfort following surgery, states she thinks the surgeon "finally got it right" The PT denies any adverse reactions to current medications since the last visit.  There are no new concerns.  C/o right hand pain , swelling and deformity, but will wait on anymore surgery this year. States she got a reminder from GI that her colonoscopy was due, has questions about this,, I advised she directly contact the office and schedule appt with her GI doc, I have entered the referral    Review of Systems See HPI Denies recent fever or chills. Denies sinus pressure, nasal congestion, ear pain or sore throat. Denies chest congestion, productive cough or wheezing. Denies chest pains, palpitations and leg swelling Denies abdominal pain, nausea, vomiting,diarrhea or constipation.   Denies dysuria, frequency, hesitancy or incontinence.  Denies headaches, seizures, numbness, or tingling. Denies uncontrolled  depression, anxiety or insomnia.does state she is frustrated with her health, always hurting. Denies skin break down or rash.        Objective:   Physical Exam Patient alert and oriented and in no cardiopulmonary distress.  HEENT: No facial asymmetry, EOMI, no sinus tenderness,  oropharynx pink and moist.  Neck supple no adenopathy.  Chest: Clear to auscultation bilaterally.  CVS: S1, S2 no murmurs, no S3.  ABD: Soft non tender. Bowel sounds normal.  Ext: No edema  MS: Adequate ROM spine, shoulders, hips and  knees.  Skin: Intact, no ulcerations or rash noted.  Psych: Good eye contact, normal affect. Memory intact not anxious or depressed appearing.  CNS: CN 2-12 intact, power, tone and sensation normal throughout.        Assessment & Plan:

## 2010-11-24 LAB — BASIC METABOLIC PANEL
Calcium: 9
Creatinine, Ser: 0.84
GFR calc Af Amer: >60
GFR calc non Af Amer: >60
Sodium: 141

## 2010-11-24 LAB — POCT HEMOGLOBIN-HEMACUE: Operator id: 23949

## 2010-12-06 ENCOUNTER — Telehealth: Payer: Self-pay

## 2010-12-06 NOTE — Telephone Encounter (Signed)
LMOM for pt to call and schedule OV appt for GI issues per Dr. Lodema Hong.

## 2010-12-07 ENCOUNTER — Other Ambulatory Visit: Payer: Self-pay | Admitting: Family Medicine

## 2010-12-07 DIAGNOSIS — Z139 Encounter for screening, unspecified: Secondary | ICD-10-CM

## 2010-12-07 NOTE — Telephone Encounter (Signed)
Pt returned call. Said she is having some abd pain, more in her left side when she is in a bending position. Scheduled an OV at 10:00 on 12/13/2010 with Gerrit Halls, NP.

## 2010-12-07 NOTE — Telephone Encounter (Signed)
Pt called and left message for me to return call at [phone]. I returned call. Many rings and no answer.

## 2010-12-13 ENCOUNTER — Ambulatory Visit (INDEPENDENT_AMBULATORY_CARE_PROVIDER_SITE_OTHER): Payer: BC Managed Care – PPO | Admitting: Gastroenterology

## 2010-12-13 ENCOUNTER — Telehealth: Payer: Self-pay | Admitting: Family Medicine

## 2010-12-13 ENCOUNTER — Encounter: Payer: Self-pay | Admitting: Gastroenterology

## 2010-12-13 VITALS — BP 121/81 | HR 82 | Temp 97.9°F | Ht 65.0 in | Wt 142.0 lb

## 2010-12-13 DIAGNOSIS — R1012 Left upper quadrant pain: Secondary | ICD-10-CM | POA: Insufficient documentation

## 2010-12-13 DIAGNOSIS — K2270 Barrett's esophagus without dysplasia: Secondary | ICD-10-CM | POA: Insufficient documentation

## 2010-12-13 NOTE — Progress Notes (Signed)
Referring Provider: Syliva Overman, MD Primary Care Physician:  Syliva Overman, MD, MD Primary Gastroenterologist:  Dr. Jena Gauss    Chief Complaint  Patient presents with  . Question if due for colonoscopy    HPI:   Renee Waters is a 65 year old female who presents today at the request of Dr. Lodema Hong. She has a hx of short-segment Barrett's esophagus, with the last EGD in October 2009. She is due for surveillance. She is up-to-date on colonoscopy, with next screening in 2015. Last one in 2005 normal.  She presents today with LUQ dull/piercing pain, chronic for several years, lasting 3-4 seconds only with movement, exertion, bending, pulling. No N/V, wt loss, or lack of appetite. She has actually gained weight. Denies reflux exacerbation. She is on Vimovo due to hx of arthritis. Denies melena or hematochezia.   Managing bowel habits by eating prunes, figs if necessary. No lower abdominal pain.   CT abd/pelvis Oct 2011: small right inguinal hernia containing fat, slightly prominent stool in colon.   Past Medical History  Diagnosis Date  . Insomnia   . Chronic back pain   . Chronic neck pain   . Depression   . GERD (gastroesophageal reflux disease)   . Osteoarthritis   . Genital herpes   . Hypertension   . S/P colonoscopy June 2005    normal, no polyps  . S/P endoscopy June 2005, Oct 2009    2005: short-segment Barrett's, 2009: short-segment Barrett's    Past Surgical History  Procedure Date  . Cervical disectomy 2002  . Abdominal hysterectomy   . Shoulder arthroscopy 2008  . Right hip replacement     went back in sept 2012 to fix  . Hernia repair 07/2010    Dr. Arlean Hopping    Current Outpatient Prescriptions  Medication Sig Dispense Refill  . acyclovir (ZOVIRAX) 800 MG tablet Take 800 mg by mouth 4 (four) times daily.        Marland Kitchen ALPRAZolam (XANAX) 0.5 MG tablet Take 1 tablet (0.5 mg total) by mouth at bedtime.  30 tablet  2  . citalopram (CELEXA) 20 MG tablet TAKE 1 TABLET  EVERY DAY  90 tablet  0  . cloNIDine (CATAPRES) 0.3 MG tablet Take 1 tablet (0.3 mg total) by mouth 2 (two) times daily.  180 tablet  1  . cyclobenzaprine (FLEXERIL) 10 MG tablet TAKE 1 TABLET BY MOUTH 3 TIMES A DAY AS NEEDED FOR MUSCLE SPASMS  90 tablet  3  . gabapentin (NEURONTIN) 300 MG capsule Take 300 mg by mouth 3 (three) times daily. Two capsules two times a day       . HYDROcodone-acetaminophen (NORCO) 10-325 MG per tablet Take 1 tablet by mouth every 6 (six) hours as needed.        . Naproxen-Esomeprazole 500-20 MG TBEC Take 1 tablet by mouth 2 (two) times daily.  60 tablet  3  . temazepam (RESTORIL) 30 MG capsule Take 30 mg by mouth at bedtime as needed.       . cephALEXin (KEFLEX) 500 MG capsule Take 500 mg by mouth 4 (four) times daily.          Allergies as of 12/13/2010 - Review Complete 12/13/2010  Allergen Reaction Noted  . Tylox (oxycodone-acetaminophen) Other (See Comments) 09/27/2010    Family History  Problem Relation Age of Onset  . Hypertension Mother   . Colon cancer Neg Hx     History   Social History  . Marital Status: Married    Spouse  Name: N/A    Number of Children: N/A  . Years of Education: N/A   Occupational History  . Not on file.   Social History Main Topics  . Smoking status: Former Smoker    Types: Cigarettes  . Smokeless tobacco: Not on file   Comment: quit in 2004  . Alcohol Use: Yes     socially, rare  . Drug Use: No  . Sexually Active: Not on file   Other Topics Concern  . Not on file   Social History Narrative  . No narrative on file    Review of Systems: Gen: Denies any fever, chills, loss of appetite, fatigue, weight loss. CV: Denies chest pain, heart palpitations, syncope, peripheral edema. Resp: Denies shortness of breath with rest, cough, wheezing GI: Denies dysphagia or odynophagia. Denies hematemesis, fecal incontinence, or jaundice.  GU : Denies urinary burning, urinary frequency, urinary incontinence.  MS: Denies  joint pain, muscle weakness, cramps, limited movement Derm: Denies rash, itching, dry skin Psych: Denies depression, anxiety, confusion or memory loss  Heme: Denies bruising, bleeding, and enlarged lymph nodes.  Physical Exam: BP 121/81  Pulse 82  Temp(Src) 97.9 F (36.6 C) (Temporal)  Ht 5\' 5"  (1.651 m)  Wt 142 lb (64.411 kg)  BMI 23.63 kg/m2 General:   Alert and oriented. Well-developed, well-nourished, pleasant and cooperative. Head:  Normocephalic and atraumatic. Eyes:  Conjunctiva pink, sclera clear, no icterus.    Ears:  Normal auditory acuity. Nose:  No deformity, discharge,  or lesions. Mouth:  No deformity or lesions, mucosa pink and moist.  Neck:  Supple, without mass or thyromegaly. Lungs:  Clear to auscultation bilaterally, without wheezing, rales, or rhonchi.  Heart:  S1, S2 present without murmurs noted.  Abdomen:  +BS, soft, non-tender and non-distended. Without mass or HSM. No rebound or guarding. No hernias noted. Rectal:  Deferred  Msk:  Symmetrical without gross deformities. Normal posture. Extremities:  Without clubbing or edema. Neurologic:  Alert and  oriented x4;  grossly normal neurologically. Skin:  Intact, warm and dry without significant lesions or rashes Cervical Nodes:  No significant cervical adenopathy. Psych:  Alert and cooperative. Normal mood and affect.

## 2010-12-13 NOTE — Progress Notes (Signed)
Cc to PCP 

## 2010-12-13 NOTE — Assessment & Plan Note (Signed)
Chronic LUQ pain, lasting several seconds at a time, only associated with movement/bending/exertion. Pt has hx of chronic back pain, DDD as well. Doubt this is GI related at this time. No hernias noted on exam. Question abd wall pain, exacerbated by chronic back pain. No other concerning symptoms at this time. Proceed with EGD for Barrett's as planned.

## 2010-12-13 NOTE — Patient Instructions (Signed)
We have set you up for an upper endoscopy with Dr. Jena Gauss due to your history of Barrett's.  Further recommendations to follow once this is completed.

## 2010-12-13 NOTE — Assessment & Plan Note (Signed)
66 year old female with hx of short-segment Barrett's, last EGD in 2009. Due for updated surveillance. She denies any reflux exacerbation, dysphagia, N/V. She does report chronic LUQ pain, unrelated to eating/drinking, only occurs with movement. Will discuss under "LUQ pain". As of note, on Vimovo due to arthritis. May need to avoid NSAIDs, depending on EGD findings. To be determined after EGD.  Proceed with upper endoscopy in the near future with Dr. Jena Gauss. This will be completed with Propofol in the OR due to chronic narcotics for arthritis, polypharmacy. The risks, benefits, and alternatives have been discussed in detail with patient. She has stated understanding and desire to proceed.

## 2010-12-14 ENCOUNTER — Other Ambulatory Visit: Payer: Self-pay

## 2010-12-14 MED ORDER — TEMAZEPAM 30 MG PO CAPS: 30.0000 mg | ORAL_CAPSULE | Freq: Every evening | ORAL | Status: DC | PRN

## 2010-12-14 MED ORDER — ALPRAZOLAM 0.5 MG PO TABS: 0.5000 mg | ORAL_TABLET | Freq: Every day | ORAL | Status: DC

## 2010-12-15 NOTE — Telephone Encounter (Signed)
Faxed in again this am

## 2010-12-16 ENCOUNTER — Other Ambulatory Visit: Payer: Self-pay | Admitting: Family Medicine

## 2010-12-28 ENCOUNTER — Ambulatory Visit (HOSPITAL_COMMUNITY): Payer: BC Managed Care – PPO

## 2011-01-03 ENCOUNTER — Other Ambulatory Visit: Payer: Self-pay | Admitting: Family Medicine

## 2011-01-03 ENCOUNTER — Encounter (HOSPITAL_COMMUNITY): Admission: RE | Admit: 2011-01-03 | Discharge: 2011-01-03 | Payer: Medicare Other | Source: Ambulatory Visit

## 2011-01-03 ENCOUNTER — Other Ambulatory Visit: Payer: Self-pay

## 2011-01-03 MED ORDER — HYDROCODONE-ACETAMINOPHEN 10-325 MG PO TABS: 1.0000 | ORAL_TABLET | Freq: Four times a day (QID) | ORAL | Status: DC | PRN

## 2011-01-03 NOTE — Patient Instructions (Signed)
20 Renee Waters  01/03/2011   Your procedure is scheduled on:  01/06/2011  Report to [facility] at 11:00 AM.  Call this number if you have problems the morning of surgery: [phone]   Remember:   Do not eat food:After Midnight.  May have clear liquids:until Midnight .  Clear liquids include soda, tea, black coffee, apple or grape juice, broth.  Take these medicines the morning of surgery with A SIP OF WATER:    Do not wear jewelry, make-up or nail polish.  Do not wear lotions, powders, or perfumes. You may wear deodorant.  Do not shave 48 hours prior to surgery.  Do not bring valuables to the hospital.  Contacts, dentures or bridgework may not be worn into surgery.  Leave suitcase in the car. After surgery it may be brought to your room.  For patients admitted to the hospital, checkout time is 11:00 AM the day of discharge.   Patients discharged the day of surgery will not be allowed to drive home.  Name and phone number of your driver:   Special Instructions: N/A   Please read over the following fact sheets that you were given: Anesthesia Post-op Instructions      PATIENT INSTRUCTIONS POST-ANESTHESIA  IMMEDIATELY FOLLOWING SURGERY:  Do not drive or operate machinery for the first twenty four hours after surgery.  Do not make any important decisions for twenty four hours after surgery or while taking narcotic pain medications or sedatives.  If you develop intractable nausea and vomiting or a severe headache please notify your doctor immediately.  FOLLOW-UP:  Please make an appointment with your surgeon as instructed. You do not need to follow up with anesthesia unless specifically instructed to do so.  WOUND CARE INSTRUCTIONS (if applicable):  Keep a dry clean dressing on the anesthesia/puncture wound site if there is drainage.  Once the wound has quit draining you may leave it open to air.  Generally you should leave the bandage intact for twenty four hours unless there is  drainage.  If the epidural site drains for more than 36-48 hours please call the anesthesia department.  QUESTIONS?:  Please feel free to call your physician or the hospital operator if you have any questions, and they will be happy to assist you.     [facility] Anesthesia Department [address] [place] [phone]

## 2011-01-05 ENCOUNTER — Encounter (HOSPITAL_COMMUNITY): Payer: Self-pay | Admitting: Pharmacy Technician

## 2011-01-05 ENCOUNTER — Encounter (HOSPITAL_COMMUNITY): Payer: Self-pay

## 2011-01-05 ENCOUNTER — Telehealth: Payer: Self-pay | Admitting: Gastroenterology

## 2011-01-05 ENCOUNTER — Other Ambulatory Visit: Payer: Self-pay

## 2011-01-05 ENCOUNTER — Encounter (HOSPITAL_COMMUNITY)
Admission: RE | Admit: 2011-01-05 | Discharge: 2011-01-05 | Disposition: A | Discharge status: Home / Self Care | Payer: Medicare Other | Source: Ambulatory Visit | Attending: Internal Medicine | Admitting: Internal Medicine

## 2011-01-05 HISTORY — DX: Barrett's esophagus without dysplasia: K22.70

## 2011-01-05 LAB — BASIC METABOLIC PANEL
BUN: 22 mg/dL (ref 6–23)
Calcium: 8.9 mg/dL (ref 8.4–10.5)
GFR calc non Af Amer: 69 mL/min — ABNORMAL LOW (ref >90)
Glucose, Bld: 115 mg/dL — ABNORMAL HIGH (ref 70–99)
Sodium: 138 mEq/L (ref 135–145)

## 2011-01-05 LAB — CBC
MCH: 33.9 pg (ref 26.0–34.0)
MCHC: 33.3 g/dL (ref 30.0–36.0)
Platelets: 244 10*3/uL (ref 150–400)
RBC: 3.19 MIL/uL — ABNORMAL LOW (ref 3.87–5.11)

## 2011-01-05 NOTE — Telephone Encounter (Signed)
Pt procedure time moved up to 7:30 due to cancellation in RMR schedule- LMOM with instructions and to call me back to confirm

## 2011-01-05 NOTE — Patient Instructions (Addendum)
20 Renee Waters  01/05/2011   Your procedure is scheduled on:  Thursday, 01/06/11  Report to [facility] at 06:15 AM.  Call this number if you have problems the morning of surgery: [phone]   Remember:   Do not eat food:After Midnight.  May have clear liquids:until Midnight .  Clear liquids include soda, tea, black coffee, apple or grape juice, broth.  Take these medicines the morning of surgery with A SIP OF WATER: xanax, norco, celexa, catapres, flexeril, neurontin   Do not wear jewelry, make-up or nail polish.  Do not wear lotions, powders, or perfumes. You may wear deodorant.  Do not bring valuables to the hospital.  Contacts, dentures or bridgework may not be worn into surgery.     Patients discharged the day of surgery will not be allowed to drive home.  Name and phone number of your driver: driver  Special Instructions: N/A   Please read over the following fact sheets that you were given: Anesthesia Post-op Instructions    Esophagogastroduodenoscopy This is an endoscopic procedure (a procedure that uses a device like a flexible telescope) that allows your caregiver to view the upper stomach and small bowel. This test allows your caregiver to look at the esophagus. The esophagus carries food from your mouth to your stomach. They can also look at your duodenum. This is the first part of the small intestine that attaches to the stomach. This test is used to detect problems in the bowel such as ulcers and inflammation. PREPARATION FOR TEST Nothing to eat after midnight the day before the test. NORMAL FINDINGS Normal esophagus, stomach, and duodenum. Ranges for normal findings may vary among different laboratories and hospitals. You should always check with your doctor after having lab work or other tests done to discuss the meaning of your test results and whether your values are considered within normal limits. MEANING OF TEST  Your caregiver will go over the test results with  you and discuss the importance and meaning of your results, as well as treatment options and the need for additional tests if necessary. OBTAINING THE TEST RESULTS It is your responsibility to obtain your test results. Ask the lab or department performing the test when and how you will get your results. Document Released: 05/27/2004 Document Revised: 10/06/2010 Document Reviewed: 01/04/2008 [facility] Patient Information 2012 [place], [place].

## 2011-01-06 ENCOUNTER — Encounter (HOSPITAL_COMMUNITY): Payer: Self-pay

## 2011-01-06 ENCOUNTER — Ambulatory Visit (HOSPITAL_COMMUNITY): Payer: Medicare Other | Admitting: Anesthesiology

## 2011-01-06 ENCOUNTER — Ambulatory Visit (HOSPITAL_COMMUNITY)
Admission: RE | Admit: 2011-01-06 | Discharge: 2011-01-06 | Disposition: A | Discharge status: Home / Self Care | Payer: Medicare Other | Source: Ambulatory Visit | Attending: Internal Medicine | Admitting: Internal Medicine

## 2011-01-06 ENCOUNTER — Encounter: Payer: BC Managed Care – PPO | Admitting: Family Medicine

## 2011-01-06 ENCOUNTER — Other Ambulatory Visit: Payer: Self-pay | Admitting: Internal Medicine

## 2011-01-06 ENCOUNTER — Encounter (HOSPITAL_COMMUNITY)
Admission: RE | Disposition: A | Discharge status: Home / Self Care | Payer: Self-pay | Source: Ambulatory Visit | Attending: Internal Medicine

## 2011-01-06 ENCOUNTER — Encounter (HOSPITAL_COMMUNITY): Payer: Self-pay | Admitting: Anesthesiology

## 2011-01-06 DIAGNOSIS — K2270 Barrett's esophagus without dysplasia: Secondary | ICD-10-CM | POA: Insufficient documentation

## 2011-01-06 DIAGNOSIS — Z79899 Other long term (current) drug therapy: Secondary | ICD-10-CM | POA: Insufficient documentation

## 2011-01-06 DIAGNOSIS — K449 Diaphragmatic hernia without obstruction or gangrene: Secondary | ICD-10-CM | POA: Insufficient documentation

## 2011-01-06 DIAGNOSIS — I10 Essential (primary) hypertension: Secondary | ICD-10-CM | POA: Insufficient documentation

## 2011-01-06 DIAGNOSIS — Z01810 Encounter for preprocedural cardiovascular examination: Secondary | ICD-10-CM | POA: Insufficient documentation

## 2011-01-06 HISTORY — PX: ESOPHAGOGASTRODUODENOSCOPY: SHX1529

## 2011-01-06 SURGERY — ESOPHAGOGASTRODUODENOSCOPY (EGD) WITH PROPOFOL
Anesthesia: Monitor Anesthesia Care

## 2011-01-06 MED ORDER — LIDOCAINE HCL (CARDIAC) 20 MG/ML IV SOLN
INTRAVENOUS | Status: DC | PRN
  Administered 2011-01-06: 25 mg via INTRAVENOUS

## 2011-01-06 MED ORDER — GLYCOPYRROLATE 0.2 MG/ML IJ SOLN
INTRAMUSCULAR | Status: AC
  Filled 2011-01-06: qty 1

## 2011-01-06 MED ORDER — LACTATED RINGERS IV SOLN
INTRAVENOUS | Status: DC
  Administered 2011-01-06: 07:00:00 via INTRAVENOUS

## 2011-01-06 MED ORDER — MIDAZOLAM HCL 2 MG/2ML IJ SOLN
INTRAMUSCULAR | Status: AC
  Filled 2011-01-06: qty 2

## 2011-01-06 MED ORDER — PROPOFOL 10 MG/ML IV EMUL
INTRAVENOUS | Status: DC | PRN
  Administered 2011-01-06: 75 ug/kg/min via INTRAVENOUS

## 2011-01-06 MED ORDER — STERILE WATER FOR IRRIGATION IR SOLN
Status: DC | PRN
  Administered 2011-01-06: 08:00:00

## 2011-01-06 MED ORDER — BUTAMBEN-TETRACAINE-BENZOCAINE 2-2-14 % EX AERO
1.0000 | INHALATION_SPRAY | Freq: Once | CUTANEOUS | Status: AC
  Administered 2011-01-06: 1 via TOPICAL
  Filled 2011-01-06: qty 56

## 2011-01-06 MED ORDER — MIDAZOLAM HCL 5 MG/5ML IJ SOLN
INTRAMUSCULAR | Status: DC | PRN
  Administered 2011-01-06: 1 mg via INTRAVENOUS

## 2011-01-06 MED ORDER — GLYCOPYRROLATE 0.2 MG/ML IJ SOLN
0.2000 mg | Freq: Once | INTRAMUSCULAR | Status: AC | PRN
  Administered 2011-01-06: 0.2 mg via INTRAVENOUS

## 2011-01-06 MED ORDER — LIDOCAINE HCL (PF) 1 % IJ SOLN
INTRAMUSCULAR | Status: AC
  Filled 2011-01-06: qty 5

## 2011-01-06 MED ORDER — MIDAZOLAM HCL 2 MG/2ML IJ SOLN
1.0000 mg | INTRAMUSCULAR | Status: DC | PRN
  Administered 2011-01-06: 2 mg via INTRAVENOUS

## 2011-01-06 MED ORDER — PROPOFOL 10 MG/ML IV EMUL
INTRAVENOUS | Status: AC
  Filled 2011-01-06: qty 20

## 2011-01-06 SURGICAL SUPPLY — 18 items
BLOCK BITE 60FR ADLT L/F BLUE (MISCELLANEOUS) ×2 IMPLANT
DEVICE CLIP HEMOSTAT 235CM (CLIP) IMPLANT
ELECT REM PT RETURN 9FT ADLT (ELECTROSURGICAL)
ELECTRODE REM PT RTRN 9FT ADLT (ELECTROSURGICAL) IMPLANT
FLOOR PAD 36X40 (MISCELLANEOUS) ×2
FORCEP RJ3 GP 1.8X160 W-NEEDLE (CUTTING FORCEPS) IMPLANT
FORCEPS BIOP RAD 4 LRG CAP 4 (CUTTING FORCEPS) ×2 IMPLANT
MANIFOLD NEPTUNE WASTE (CANNULA) ×2 IMPLANT
NEEDLE SCLEROTHERAPY 25GX240 (NEEDLE) IMPLANT
PAD FLOOR 36X40 (MISCELLANEOUS) ×1 IMPLANT
PROBE APC STR FIRE (PROBE) IMPLANT
PROBE INJECTION GOLD (MISCELLANEOUS)
PROBE INJECTION GOLD 7FR (MISCELLANEOUS) IMPLANT
SNARE ROTATE MED OVAL 20MM (MISCELLANEOUS) IMPLANT
SYR 50ML LL SCALE MARK (SYRINGE) IMPLANT
TUBING ENDO SMARTCAP PENTAX (MISCELLANEOUS) ×2 IMPLANT
TUBING IRRIGATION ENDOGATOR (MISCELLANEOUS) ×2 IMPLANT
WATER STERILE IRR 1000ML POUR (IV SOLUTION) ×2 IMPLANT

## 2011-01-06 NOTE — Anesthesia Preprocedure Evaluation (Addendum)
Anesthesia Evaluation  Patient identified by MRN, date of birth, ID band Patient awake    Reviewed: Allergy & Precautions, H&P , NPO status , Patient's Chart, lab work & pertinent test results  History of Anesthesia Complications Negative for: history of anesthetic complications  Airway Mallampati: I      Dental  (+) Teeth Intact   Pulmonary    Pulmonary exam normal       Cardiovascular hypertension, Pt. on medications Regular Normal    Neuro/Psych    GI/Hepatic GERD-  Medicated and Controlled,  Endo/Other    Renal/GU      Musculoskeletal   Abdominal   Peds  Hematology   Anesthesia Other Findings   Reproductive/Obstetrics                           Anesthesia Physical Anesthesia Plan  ASA: II  Anesthesia Plan: MAC   Post-op Pain Management:    Induction: Intravenous  Airway Management Planned: Simple Face Mask  Additional Equipment:   Intra-op Plan:   Post-operative Plan:   Informed Consent: I have reviewed the patients History and Physical, chart, labs and discussed the procedure including the risks, benefits and alternatives for the proposed anesthesia with the patient or authorized representative who has indicated his/her understanding and acceptance.     Plan Discussed with:   Anesthesia Plan Comments:         Anesthesia Quick Evaluation

## 2011-01-06 NOTE — H&P (Signed)
  I have seen & examined the patient prior to the procedure(s) today and reviewed the history and physical/consultation.  There have been no changes.  After consideration of the risks, benefits, alternatives and imponderables, the patient has consented to the procedure(s).   

## 2011-01-06 NOTE — Transfer of Care (Signed)
Immediate Anesthesia Transfer of Care Note  Patient: Renee Waters  Procedure(s) Performed:  ESOPHAGOGASTRODUODENOSCOPY (EGD) WITH PROPOFOL - Esophagogastroduodenoscopy with biopsies  Patient Location: PACU  Anesthesia Type: MAC  Level of Consciousness: sedated and patient cooperative  Airway & Oxygen Therapy: Patient Spontanous Breathing and Patient connected to face mask oxygen  Post-op Assessment: Report given to PACU RN, Post -op Vital signs reviewed and stable and Patient moving all extremities X 4  Post vital signs: Reviewed and stable  Complications: No apparent anesthesia complications

## 2011-01-06 NOTE — Anesthesia Postprocedure Evaluation (Signed)
  Anesthesia Post-op Note  Patient: Renee Waters  Procedure(s) Performed:  ESOPHAGOGASTRODUODENOSCOPY (EGD) WITH PROPOFOL - Esophagogastroduodenoscopy with biopsies  Patient Location: PACU  Anesthesia Type: MAC  Level of Consciousness: awake, alert , oriented and patient cooperative  Airway and Oxygen Therapy: Patient Spontanous Breathing  Post-op Pain: none  Post-op Assessment: Post-op Vital signs reviewed, Patient's Cardiovascular Status Stable, Respiratory Function Stable, Patent Airway and No signs of Nausea or vomiting  Post-op Vital Signs: Reviewed and stable  Complications: No apparent anesthesia complications

## 2011-01-06 NOTE — Anesthesia Procedure Notes (Signed)
Procedures

## 2011-01-09 ENCOUNTER — Encounter: Payer: Self-pay | Admitting: Internal Medicine

## 2011-01-19 ENCOUNTER — Telehealth: Payer: Self-pay | Admitting: Family Medicine

## 2011-01-20 NOTE — Telephone Encounter (Signed)
Already has appt with foot doctor to have it checked

## 2011-02-07 ENCOUNTER — Encounter: Payer: Self-pay | Admitting: Family Medicine

## 2011-02-08 HISTORY — PX: CARPAL TUNNEL RELEASE: SHX101

## 2011-02-14 ENCOUNTER — Telehealth: Payer: Self-pay | Admitting: Family Medicine

## 2011-02-14 DIAGNOSIS — L03119 Cellulitis of unspecified part of limb: Secondary | ICD-10-CM | POA: Diagnosis not present

## 2011-02-14 DIAGNOSIS — L97409 Non-pressure chronic ulcer of unspecified heel and midfoot with unspecified severity: Secondary | ICD-10-CM | POA: Diagnosis not present

## 2011-02-14 DIAGNOSIS — L02619 Cutaneous abscess of unspecified foot: Secondary | ICD-10-CM | POA: Diagnosis not present

## 2011-02-16 DIAGNOSIS — M949 Disorder of cartilage, unspecified: Secondary | ICD-10-CM | POA: Diagnosis not present

## 2011-02-16 DIAGNOSIS — R5381 Other malaise: Secondary | ICD-10-CM | POA: Diagnosis not present

## 2011-02-16 DIAGNOSIS — M899 Disorder of bone, unspecified: Secondary | ICD-10-CM | POA: Diagnosis not present

## 2011-02-16 DIAGNOSIS — E785 Hyperlipidemia, unspecified: Secondary | ICD-10-CM | POA: Diagnosis not present

## 2011-02-16 DIAGNOSIS — I10 Essential (primary) hypertension: Secondary | ICD-10-CM | POA: Diagnosis not present

## 2011-02-16 DIAGNOSIS — Z139 Encounter for screening, unspecified: Secondary | ICD-10-CM | POA: Diagnosis not present

## 2011-02-16 MED ORDER — CYCLOBENZAPRINE HCL 10 MG PO TABS: ORAL_TABLET | ORAL | Status: DC

## 2011-02-16 NOTE — Telephone Encounter (Signed)
Is it ok to refill? 

## 2011-02-16 NOTE — Telephone Encounter (Signed)
Refilled as requested. [facility] taken out of system

## 2011-02-17 ENCOUNTER — Encounter: Payer: BC Managed Care – PPO | Admitting: Family Medicine

## 2011-02-17 ENCOUNTER — Encounter: Payer: Self-pay | Admitting: Family Medicine

## 2011-02-17 LAB — LIPID PANEL
HDL: 51 mg/dL (ref >39)
LDL Cholesterol: 98 mg/dL (ref 0–99)
Triglycerides: 70 mg/dL (ref <150)
VLDL: 14 mg/dL (ref 0–40)

## 2011-02-17 LAB — VITAMIN D 25 HYDROXY (VIT D DEFICIENCY, FRACTURES): Vit D, 25-Hydroxy: 18 ng/mL — ABNORMAL LOW (ref 30–89)

## 2011-02-17 LAB — BASIC METABOLIC PANEL
CO2: 18 mEq/L — ABNORMAL LOW (ref 19–32)
Chloride: 107 mEq/L (ref 96–112)
Creat: 0.78 mg/dL (ref 0.50–1.10)
Potassium: 3.9 mEq/L (ref 3.5–5.3)

## 2011-02-17 LAB — HEPATIC FUNCTION PANEL
Albumin: 4.9 g/dL (ref 3.5–5.2)
Total Bilirubin: 0.4 mg/dL (ref 0.3–1.2)
Total Protein: 8.2 g/dL (ref 6.0–8.3)

## 2011-02-17 LAB — TSH: TSH: 1.119 u[IU]/mL (ref 0.350–4.500)

## 2011-02-21 ENCOUNTER — Ambulatory Visit (INDEPENDENT_AMBULATORY_CARE_PROVIDER_SITE_OTHER): Payer: Medicare Other | Admitting: Family Medicine

## 2011-02-21 ENCOUNTER — Encounter: Payer: Self-pay | Admitting: Family Medicine

## 2011-02-21 ENCOUNTER — Other Ambulatory Visit (HOSPITAL_COMMUNITY)
Admission: RE | Admit: 2011-02-21 | Discharge: 2011-02-21 | Disposition: A | Discharge status: Home / Self Care | Payer: Medicare Other | Source: Ambulatory Visit | Attending: Family Medicine | Admitting: Family Medicine

## 2011-02-21 VITALS — BP 142/90 | HR 84 | Resp 16 | Ht 65.0 in | Wt 141.1 lb

## 2011-02-21 DIAGNOSIS — Z23 Encounter for immunization: Secondary | ICD-10-CM

## 2011-02-21 DIAGNOSIS — Z01419 Encounter for gynecological examination (general) (routine) without abnormal findings: Secondary | ICD-10-CM

## 2011-02-21 DIAGNOSIS — D539 Nutritional anemia, unspecified: Secondary | ICD-10-CM

## 2011-02-21 DIAGNOSIS — R5381 Other malaise: Secondary | ICD-10-CM | POA: Diagnosis not present

## 2011-02-21 DIAGNOSIS — Z1211 Encounter for screening for malignant neoplasm of colon: Secondary | ICD-10-CM | POA: Diagnosis not present

## 2011-02-21 DIAGNOSIS — Z124 Encounter for screening for malignant neoplasm of cervix: Secondary | ICD-10-CM | POA: Insufficient documentation

## 2011-02-21 DIAGNOSIS — Z0000 Encounter for general adult medical examination without abnormal findings: Secondary | ICD-10-CM | POA: Diagnosis not present

## 2011-02-21 DIAGNOSIS — E538 Deficiency of other specified B group vitamins: Secondary | ICD-10-CM | POA: Diagnosis not present

## 2011-02-21 DIAGNOSIS — R5383 Other fatigue: Secondary | ICD-10-CM

## 2011-02-21 DIAGNOSIS — I10 Essential (primary) hypertension: Secondary | ICD-10-CM

## 2011-02-21 DIAGNOSIS — R7301 Impaired fasting glucose: Secondary | ICD-10-CM

## 2011-02-21 DIAGNOSIS — M1990 Unspecified osteoarthritis, unspecified site: Secondary | ICD-10-CM

## 2011-02-21 DIAGNOSIS — F329 Major depressive disorder, single episode, unspecified: Secondary | ICD-10-CM

## 2011-02-21 LAB — POC HEMOCCULT BLD/STL (OFFICE/1-CARD/DIAGNOSTIC): Fecal Occult Blood, POC: NEGATIVE

## 2011-02-21 NOTE — Assessment & Plan Note (Signed)
Controlled, no change in medication  

## 2011-02-21 NOTE — Assessment & Plan Note (Signed)
Uncontrolled at this visit, DASH to be followed, no med change, has been good at previous visits and pt reports nl numbers

## 2011-02-21 NOTE — Patient Instructions (Addendum)
F/U in 4 months.  HBA1c , anemia panel and cbc and vit D  Today  Blood pressure is elevated at this visit, pls follow the DASH diet, which we will provide, cut back on salt.  Toradol today for hip pain (right)  Pneumonia vaccine today

## 2011-02-21 NOTE — Assessment & Plan Note (Signed)
Reports 2 falls in the past 3 months, also increased left hip pain, will administer toradol today

## 2011-02-21 NOTE — Progress Notes (Signed)
  Subjective:    Patient ID: Renee Waters, female    DOB: 01-18-1946, 66 y.o.   MRN: [id number]  HPI The PT is here for annual exam and re-evaluation of chronic medical conditions, medication management and review of any available recent lab and radiology data.  Preventive health is updated, specifically  Cancer screening and Immunization.   Questions or concerns regarding consultations or procedures which the PT has had in the interim are  addressed. The PT denies any adverse reactions to current medications since the last visit.  There are no new concerns.  C/o increased right hip [pain and recurrent falls, she sees ortho on a regular basis    Review of Systems See HPI Denies recent fever or chills. Denies sinus pressure, nasal congestion, ear pain or sore throat. Denies chest congestion, productive cough or wheezing. Denies chest pains, palpitations and leg swelling Denies abdominal pain, nausea, vomiting,diarrhea or constipation.   Denies dysuria, frequency, hesitancy or incontinence.  Denies headaches, seizures, numbness, or tingling. Denies depression, anxiety or insomnia. Denies skin break down or rash.        Objective:   Physical Exam Pleasant well nourished female, alert and oriented x 3, in no cardio-pulmonary distress. Afebrile. HEENT No facial trauma or asymetry. Sinuses non tender.  EOMI, PERTL, fundoscopic exam is normal, no hemorhage or exudate.  External ears normal, tympanic membranes clear. Oropharynx moist, no exudate, fair dentition. Neck: supple, no adenopathy,JVD or thyromegaly.No bruits.  Chest: Clear to ascultation bilaterally.No crackles or wheezes. Non tender to palpation  Breast: No asymetry,no masses. No nipple discharge or inversion. No axillary or supraclavicular adenopathy  Cardiovascular system; Heart sounds normal,  S1 and  S2 ,no S3.  No murmur, or thrill. Apical beat not displaced Peripheral pulses normal.  Abdomen: Soft,  non tender, no organomegaly or masses. No bruits. Bowel sounds normal. No guarding, tenderness or rebound.  Rectal:  No mass. Guaiac negative stool.  GU: External genitalia normal. No lesions. Vaginal canal normal.No discharge. Uterus absent, no adnexal masses, no  adnexal tenderness.  Musculoskeletal exam: Decreased  ROM of spine, hips , shoulders and full in  knees. No deformity ,swelling or crepitus noted. No muscle wasting or atrophy.   Neurologic: Cranial nerves 2 to 12 intact. Power, tone ,sensation and reflexes normal throughout. No disturbance in gait. No tremor.  Skin: Intact, no ulceration, erythema , scaling or rash noted. Pigmentation normal throughout  Psych; Normal mood and affect. Judgement and concentration normal        Assessment & Plan:

## 2011-02-22 ENCOUNTER — Telehealth: Payer: Self-pay | Admitting: Family Medicine

## 2011-02-22 ENCOUNTER — Other Ambulatory Visit: Payer: Self-pay

## 2011-02-22 DIAGNOSIS — R5381 Other malaise: Secondary | ICD-10-CM | POA: Diagnosis not present

## 2011-02-22 DIAGNOSIS — R7301 Impaired fasting glucose: Secondary | ICD-10-CM | POA: Diagnosis not present

## 2011-02-22 DIAGNOSIS — E538 Deficiency of other specified B group vitamins: Secondary | ICD-10-CM | POA: Diagnosis not present

## 2011-02-22 DIAGNOSIS — D539 Nutritional anemia, unspecified: Secondary | ICD-10-CM | POA: Diagnosis not present

## 2011-02-22 LAB — CBC WITH DIFFERENTIAL/PLATELET
Basophils Absolute: 0 10*3/uL (ref 0.0–0.1)
Eosinophils Relative: 3 % (ref 0–5)
Lymphocytes Relative: 22 % (ref 12–46)
MCV: 98.4 fL (ref 78.0–100.0)
Neutro Abs: 3.6 10*3/uL (ref 1.7–7.7)
Neutrophils Relative %: 66 % (ref 43–77)
Platelets: 197 10*3/uL (ref 150–400)
RDW: 13.6 % (ref 11.5–15.5)
WBC: 5.5 10*3/uL (ref 4.0–10.5)

## 2011-02-22 LAB — HEMOGLOBIN A1C: Hgb A1c MFr Bld: 5.6 % (ref <5.7)

## 2011-02-22 LAB — IRON AND TIBC
%SAT: 23 % (ref 20–55)
TIBC: 336 ug/dL (ref 250–470)

## 2011-02-22 LAB — VITAMIN B12: Vitamin B-12: 721 pg/mL (ref 211–911)

## 2011-02-22 MED ORDER — KETOROLAC TROMETHAMINE 60 MG/2ML IM SOLN
60.0000 mg | Freq: Once | INTRAMUSCULAR | Status: AC
  Administered 2011-02-22: 60 mg via INTRAMUSCULAR

## 2011-02-22 MED ORDER — CITALOPRAM HYDROBROMIDE 20 MG PO TABS: ORAL_TABLET | ORAL | Status: DC

## 2011-02-22 MED ORDER — CLONIDINE HCL 0.3 MG PO TABS: ORAL_TABLET | ORAL | Status: DC

## 2011-02-22 MED ORDER — HYDROCODONE-ACETAMINOPHEN 10-325 MG PO TABS: 1.0000 | ORAL_TABLET | Freq: Four times a day (QID) | ORAL | Status: DC | PRN

## 2011-02-22 MED ORDER — TEMAZEPAM 30 MG PO CAPS: 30.0000 mg | ORAL_CAPSULE | Freq: Every evening | ORAL | Status: DC | PRN

## 2011-02-22 MED ORDER — CYCLOBENZAPRINE HCL 10 MG PO TABS: ORAL_TABLET | ORAL | Status: DC

## 2011-02-22 NOTE — Progress Notes (Signed)
Addended by: Abner Greenspan on: 02/22/2011 08:09 AM   Modules accepted: Orders

## 2011-02-22 NOTE — Telephone Encounter (Signed)
Sent in. Didn't get a chance yesterday evening

## 2011-02-22 NOTE — Telephone Encounter (Signed)
Spoke with pt and filled meds appropriately

## 2011-03-01 ENCOUNTER — Other Ambulatory Visit: Payer: Self-pay

## 2011-03-14 DIAGNOSIS — L97409 Non-pressure chronic ulcer of unspecified heel and midfoot with unspecified severity: Secondary | ICD-10-CM | POA: Diagnosis not present

## 2011-03-14 DIAGNOSIS — L03119 Cellulitis of unspecified part of limb: Secondary | ICD-10-CM | POA: Diagnosis not present

## 2011-03-23 ENCOUNTER — Telehealth: Payer: Self-pay | Admitting: Family Medicine

## 2011-03-24 ENCOUNTER — Other Ambulatory Visit: Payer: Self-pay | Admitting: Family Medicine

## 2011-03-24 MED ORDER — PREDNISONE (PAK) 5 MG PO TABS: 5.0000 mg | ORAL_TABLET | ORAL | Status: DC

## 2011-03-24 NOTE — Telephone Encounter (Signed)
pls advise pred sent to her pharmacy

## 2011-03-24 NOTE — Telephone Encounter (Signed)
Pt aware.

## 2011-03-24 NOTE — Telephone Encounter (Signed)
Has a lot of pain in her hip, back and arms. Doesn't see her ortho until Monday. Wants to know if she can get some prednisone sent in to get rid of it.

## 2011-03-30 DIAGNOSIS — M5137 Other intervertebral disc degeneration, lumbosacral region: Secondary | ICD-10-CM | POA: Diagnosis not present

## 2011-03-30 DIAGNOSIS — M5126 Other intervertebral disc displacement, lumbar region: Secondary | ICD-10-CM | POA: Diagnosis not present

## 2011-03-30 DIAGNOSIS — IMO0002 Reserved for concepts with insufficient information to code with codable children: Secondary | ICD-10-CM | POA: Diagnosis not present

## 2011-03-30 DIAGNOSIS — M5124 Other intervertebral disc displacement, thoracic region: Secondary | ICD-10-CM | POA: Diagnosis not present

## 2011-03-30 DIAGNOSIS — M47817 Spondylosis without myelopathy or radiculopathy, lumbosacral region: Secondary | ICD-10-CM | POA: Diagnosis not present

## 2011-04-24 ENCOUNTER — Other Ambulatory Visit: Payer: Self-pay | Admitting: Family Medicine

## 2011-05-17 ENCOUNTER — Telehealth: Payer: Self-pay | Admitting: Family Medicine

## 2011-05-17 NOTE — Telephone Encounter (Signed)
See if dr Romeo Apple is willing to see her , often does not go behind another surgeon in the area. pls find out specifically what the concern is, and call Dr Raquel James staff to see if he is willing to follow her for this please

## 2011-05-18 ENCOUNTER — Other Ambulatory Visit: Payer: Self-pay | Admitting: Family Medicine

## 2011-05-18 DIAGNOSIS — M79604 Pain in right leg: Secondary | ICD-10-CM

## 2011-05-18 DIAGNOSIS — M25551 Pain in right hip: Secondary | ICD-10-CM

## 2011-05-18 NOTE — Telephone Encounter (Signed)
i have entered referral and sent Dr Romeo Apple a message, his staff will let you /Ms Legore know his decision

## 2011-05-22 ENCOUNTER — Other Ambulatory Visit: Payer: Self-pay | Admitting: Family Medicine

## 2011-05-23 DIAGNOSIS — H35379 Puckering of macula, unspecified eye: Secondary | ICD-10-CM | POA: Diagnosis not present

## 2011-05-23 DIAGNOSIS — H01009 Unspecified blepharitis unspecified eye, unspecified eyelid: Secondary | ICD-10-CM | POA: Diagnosis not present

## 2011-05-27 ENCOUNTER — Telehealth: Payer: Self-pay | Admitting: Family Medicine

## 2011-05-27 MED ORDER — ALPRAZOLAM 0.5 MG PO TABS: 0.5000 mg | ORAL_TABLET | Freq: Every day | ORAL | Status: DC

## 2011-05-27 NOTE — Telephone Encounter (Signed)
Printed for Dr to sign  

## 2011-06-02 ENCOUNTER — Telehealth: Payer: Self-pay | Admitting: Family Medicine

## 2011-06-02 MED ORDER — NAPROXEN-ESOMEPRAZOLE 500-20 MG PO TBEC: DELAYED_RELEASE_TABLET | ORAL | Status: DC

## 2011-06-02 NOTE — Telephone Encounter (Signed)
Sent in per pt request

## 2011-06-14 ENCOUNTER — Telehealth: Payer: Self-pay | Admitting: Family Medicine

## 2011-06-15 MED ORDER — NAPROXEN-ESOMEPRAZOLE 500-20 MG PO TBEC: DELAYED_RELEASE_TABLET | ORAL | Status: DC

## 2011-06-15 NOTE — Telephone Encounter (Signed)
Sent in quantity of 60 tabs

## 2011-06-22 ENCOUNTER — Encounter: Payer: Self-pay | Admitting: Family Medicine

## 2011-06-22 ENCOUNTER — Ambulatory Visit (INDEPENDENT_AMBULATORY_CARE_PROVIDER_SITE_OTHER): Payer: Medicare Other | Admitting: Family Medicine

## 2011-06-22 VITALS — BP 124/84 | HR 79 | Resp 15 | Ht 65.0 in | Wt 148.0 lb

## 2011-06-22 DIAGNOSIS — F329 Major depressive disorder, single episode, unspecified: Secondary | ICD-10-CM | POA: Diagnosis not present

## 2011-06-22 DIAGNOSIS — I10 Essential (primary) hypertension: Secondary | ICD-10-CM | POA: Diagnosis not present

## 2011-06-22 DIAGNOSIS — M549 Dorsalgia, unspecified: Secondary | ICD-10-CM

## 2011-06-22 DIAGNOSIS — G4700 Insomnia, unspecified: Secondary | ICD-10-CM | POA: Diagnosis not present

## 2011-06-22 MED ORDER — KETOROLAC TROMETHAMINE 60 MG/2ML IJ SOLN
60.0000 mg | Freq: Once | INTRAMUSCULAR | Status: AC
  Administered 2011-06-22: 60 mg via INTRAMUSCULAR

## 2011-06-22 MED ORDER — METHYLPREDNISOLONE ACETATE 80 MG/ML IJ SUSP
80.0000 mg | Freq: Once | INTRAMUSCULAR | Status: AC
  Administered 2011-06-22: 80 mg via INTRAMUSCULAR

## 2011-06-22 MED ORDER — FENTANYL 25 MCG/HR TD PT72: 1.0000 | MEDICATED_PATCH | TRANSDERMAL | Status: DC

## 2011-06-22 NOTE — Patient Instructions (Signed)
F/u in 3 month, please call if you need me before.  You will get injections today for uncontrolled pain, and additional medication will be started to help with your symptoms  Please work on weight loss through dietary change.   Blood pressure today is excellent.   Please do regular breast exams, your mammogram is currently past due.  You will get infromation on management of constipation

## 2011-06-22 NOTE — Progress Notes (Signed)
  Subjective:    Patient ID: Renee Waters, female    DOB: 01/04/1946, 66 y.o.   MRN: [id number]  HPI The PT is here for follow up and re-evaluation of chronic medical conditions, medication management and review of any available recent lab and radiology data.  Preventive health is updated, specifically  Cancer screening and Immunization.   Questions or concerns regarding consultations or procedures which the PT has had in the interim are  addressed. The PT denies any adverse reactions to current medications since the last visit.  C/o increased and uncontrolled right hip pain following 2 operations, last saw the ortho who did it in March, nothing else is offered, and she has an eval by another ortho later  This month. Needs to ambulate with a cane because of fear of falling, which she has in the past. Also c/o right forearm and hand pain , broke wrist in 2012 when she fell, was put in a cast she states she has pain in hand , elbow and up to the shoulder . She is concerned about weight gain and intends to work on this     Review of Systems See HPI Denies recent fever or chills. Denies sinus pressure, nasal congestion, ear pain or sore throat. Denies chest congestion, productive cough or wheezing. Denies chest pains, palpitations and leg swelling Denies abdominal pain, nausea, vomiting,diarrhea , does have  Constipation with pain medication which she states is no linger touching her pain  Denies dysuria, frequency, hesitancy or incontinence. Denies headaches, seizures, numbness, or tingling. C/o  depression, denies anxiety or insomnia.States pain wakes her she needs help with pain management and requests injections in the office today. States unable to sleep with her spouse due to pain,  Denies skin break down or rash.        Objective:   Physical Exam        Assessment & Plan:

## 2011-06-22 NOTE — Assessment & Plan Note (Signed)
Controlled, no change in medication  

## 2011-06-22 NOTE — Assessment & Plan Note (Signed)
Medication and good sleep hygiene are addressing this however pain disturbs her sleep

## 2011-06-22 NOTE — Assessment & Plan Note (Signed)
Uncontrolled, and along with the right hip pain , pt is disabled additional med fentanyl patch is started

## 2011-06-28 ENCOUNTER — Encounter: Payer: Self-pay | Admitting: Orthopedic Surgery

## 2011-06-28 ENCOUNTER — Ambulatory Visit (INDEPENDENT_AMBULATORY_CARE_PROVIDER_SITE_OTHER): Payer: Medicare Other | Admitting: Orthopedic Surgery

## 2011-06-28 ENCOUNTER — Other Ambulatory Visit: Payer: Self-pay

## 2011-06-28 VITALS — BP 124/60 | Ht 65.0 in | Wt 148.0 lb

## 2011-06-28 DIAGNOSIS — IMO0001 Reserved for inherently not codable concepts without codable children: Secondary | ICD-10-CM

## 2011-06-28 DIAGNOSIS — M25559 Pain in unspecified hip: Secondary | ICD-10-CM

## 2011-06-28 DIAGNOSIS — T84099A Other mechanical complication of unspecified internal joint prosthesis, initial encounter: Secondary | ICD-10-CM | POA: Diagnosis not present

## 2011-06-28 DIAGNOSIS — IMO0002 Reserved for concepts with insufficient information to code with codable children: Secondary | ICD-10-CM

## 2011-06-28 MED ORDER — CLONIDINE HCL 0.3 MG PO TABS: ORAL_TABLET | ORAL | Status: DC

## 2011-06-28 MED ORDER — CYCLOBENZAPRINE HCL 10 MG PO TABS: 10.0000 mg | ORAL_TABLET | Freq: Three times a day (TID) | ORAL | Status: DC | PRN

## 2011-06-28 MED ORDER — CITALOPRAM HYDROBROMIDE 20 MG PO TABS: ORAL_TABLET | ORAL | Status: DC

## 2011-06-28 NOTE — Patient Instructions (Signed)
Will return to Dr Terrilee Croak

## 2011-06-29 ENCOUNTER — Encounter: Payer: Self-pay | Admitting: Orthopedic Surgery

## 2011-06-29 DIAGNOSIS — IMO0001 Reserved for inherently not codable concepts without codable children: Secondary | ICD-10-CM | POA: Insufficient documentation

## 2011-06-29 DIAGNOSIS — T84099A Other mechanical complication of unspecified internal joint prosthesis, initial encounter: Secondary | ICD-10-CM | POA: Insufficient documentation

## 2011-06-29 DIAGNOSIS — M25559 Pain in unspecified hip: Secondary | ICD-10-CM | POA: Insufficient documentation

## 2011-06-29 NOTE — Progress Notes (Signed)
Subjective:    Renee Waters is a 66 y.o. female who presents for a second opinion regarding her right hip replacement  The patient had an uncomplicated right hip replacement with a press-fit cup and an AML type stem and initially did well until she fell. She subsequently required reoperation at which time it appears that a hematoma was evacuated and iliotibial band release was performed.  She has continued to have pain in the groin and a catching sensation when the hip is moved from flexion to extension. She also complains of a sharp burning pain over the lateral aspect of the hip and the lateral crest of the pelvis and gluteal area.  Her pain varies from 6-10 and is constant and is associated with inability to perform activities of daily living such as cooking. She is unable to drive any long distance. She cannot work on her computer. These activities are very important for her and she would like to be able to do these things.  The following portions of the patient's history were reviewed and updated as appropriate: allergies, current medications, past family history, past medical history, past social history, past surgical history and problem list.   Review of Systems Pertinent items are noted in HPI.  Positive review of systems findings include weight gain, snoring, heartburn and constipation. Frequency and urgency at times. Anxiety and depression seasonal allergies: Intolerance.   Vital signs are stable as recorded BP 124/60  Ht 5\' 5"  (1.651 m)  Wt 148 lb (67.132 kg)  BMI 24.63 kg/m2  General appearance is normal, she has normal body habitus ectomorphic.  The patient is alert and oriented x3  The patient's mood and affect are normal  Gait assessment: Seems relatively normal posterior preplacement with a slight pelvic tilt The cardiovascular exam reveals normal pulses and temperature without edema swelling.  The lymphatic system is negative for palpable lymph nodes  The  sensory exam is normal.  There are no pathologic reflexes.  Balance is normal.  Upper extremity exam  Inspection is normal there are no joint contractures instability or muscle tone loss skin is intact  Exam of the lower extremities Inspection left hip normal Range of motion full Stability intact Strength normal grade 5 Skin normal  Inspection right hip there is a well-healed scar with some scar contracture. There is tenderness over the right gluteal region into the proximal aspect of the incision. Hip flexion is approximately 100 with painful range of motion from hip flexion back to extension this is active and passive. Adduction is normal at 20 there is some pain with internal rotation of the hip none with external rotation. Post pull test normal log roll test normal. Muscle tone normal.  Several x-rays are on x-ray disc to go back to the original procedure. The skin looks to be intact with no obvious evidence of movement or loosening. The cup position appears to have migrated into a more vertical position.  Assessment:    Status post right total hip arthroplasty and subsequent reoperation for either hematoma or iliotibial band tightness with continued groin pain and a separate burning radicular type pain in the right hip the patient also complains of catching sensation in the groin.  The cup position appears to have changed position from the original film and may be the cause of the patient's groin pain. The burning pain on the side of the hip may be related to the patient's history of disc disease in the lower back. I understand that there is  an MRI that documents the anatomy at this level and it is worth reviewing that and perhaps giving an epidural injection or nerve block if needed.      Plan:    She is agreed to followup with her surgeon. She understands my opinion. It is most likely that the cup position migrated after she fell as she was doing very well up until that  point

## 2011-07-11 DIAGNOSIS — Z96649 Presence of unspecified artificial hip joint: Secondary | ICD-10-CM | POA: Diagnosis not present

## 2011-07-11 DIAGNOSIS — M7710 Lateral epicondylitis, unspecified elbow: Secondary | ICD-10-CM | POA: Diagnosis not present

## 2011-07-11 DIAGNOSIS — M48061 Spinal stenosis, lumbar region without neurogenic claudication: Secondary | ICD-10-CM | POA: Diagnosis not present

## 2011-07-13 ENCOUNTER — Other Ambulatory Visit: Payer: Self-pay | Admitting: Family Medicine

## 2011-07-15 ENCOUNTER — Other Ambulatory Visit: Payer: Self-pay

## 2011-07-15 DIAGNOSIS — M549 Dorsalgia, unspecified: Secondary | ICD-10-CM

## 2011-07-15 MED ORDER — FENTANYL 25 MCG/HR TD PT72: 1.0000 | MEDICATED_PATCH | TRANSDERMAL | Status: DC

## 2011-07-22 ENCOUNTER — Other Ambulatory Visit: Payer: Self-pay

## 2011-07-22 ENCOUNTER — Telehealth: Payer: Self-pay | Admitting: Family Medicine

## 2011-07-22 DIAGNOSIS — Z79899 Other long term (current) drug therapy: Secondary | ICD-10-CM

## 2011-07-25 ENCOUNTER — Other Ambulatory Visit: Payer: Self-pay | Admitting: Family Medicine

## 2011-07-25 NOTE — Telephone Encounter (Signed)
error 

## 2011-07-26 LAB — FENTANYL, URINE: Fentanyl, Ur: POSITIVE ng/mL

## 2011-07-29 DIAGNOSIS — M25559 Pain in unspecified hip: Secondary | ICD-10-CM | POA: Diagnosis not present

## 2011-07-29 DIAGNOSIS — S8000XA Contusion of unspecified knee, initial encounter: Secondary | ICD-10-CM | POA: Diagnosis not present

## 2011-07-29 DIAGNOSIS — Z96649 Presence of unspecified artificial hip joint: Secondary | ICD-10-CM | POA: Diagnosis not present

## 2011-07-29 DIAGNOSIS — M1710 Unilateral primary osteoarthritis, unspecified knee: Secondary | ICD-10-CM | POA: Diagnosis not present

## 2011-07-29 DIAGNOSIS — G8929 Other chronic pain: Secondary | ICD-10-CM | POA: Diagnosis not present

## 2011-07-29 DIAGNOSIS — M25569 Pain in unspecified knee: Secondary | ICD-10-CM | POA: Diagnosis not present

## 2011-07-29 DIAGNOSIS — I10 Essential (primary) hypertension: Secondary | ICD-10-CM | POA: Diagnosis not present

## 2011-07-29 DIAGNOSIS — Z79899 Other long term (current) drug therapy: Secondary | ICD-10-CM | POA: Diagnosis not present

## 2011-08-08 ENCOUNTER — Telehealth: Payer: Self-pay | Admitting: Family Medicine

## 2011-08-08 ENCOUNTER — Other Ambulatory Visit: Payer: Self-pay

## 2011-08-08 MED ORDER — HYDROCODONE-ACETAMINOPHEN 10-325 MG PO TABS: 1.0000 | ORAL_TABLET | Freq: Four times a day (QID) | ORAL | Status: DC | PRN

## 2011-08-08 NOTE — Telephone Encounter (Signed)
Med refilled.

## 2011-08-13 ENCOUNTER — Other Ambulatory Visit: Payer: Self-pay | Admitting: Family Medicine

## 2011-08-15 ENCOUNTER — Other Ambulatory Visit: Payer: Self-pay

## 2011-08-15 MED ORDER — ALPRAZOLAM 0.5 MG PO TABS: 0.5000 mg | ORAL_TABLET | Freq: Every day | ORAL | Status: DC

## 2011-08-17 ENCOUNTER — Other Ambulatory Visit: Payer: Self-pay

## 2011-08-17 DIAGNOSIS — M549 Dorsalgia, unspecified: Secondary | ICD-10-CM

## 2011-08-17 MED ORDER — FENTANYL 25 MCG/HR TD PT72: 1.0000 | MEDICATED_PATCH | TRANSDERMAL | Status: DC

## 2011-08-29 DIAGNOSIS — M5380 Other specified dorsopathies, site unspecified: Secondary | ICD-10-CM | POA: Diagnosis not present

## 2011-08-29 DIAGNOSIS — M1990 Unspecified osteoarthritis, unspecified site: Secondary | ICD-10-CM | POA: Diagnosis not present

## 2011-08-29 DIAGNOSIS — M48061 Spinal stenosis, lumbar region without neurogenic claudication: Secondary | ICD-10-CM | POA: Diagnosis not present

## 2011-08-29 DIAGNOSIS — M5137 Other intervertebral disc degeneration, lumbosacral region: Secondary | ICD-10-CM | POA: Diagnosis not present

## 2011-09-09 ENCOUNTER — Other Ambulatory Visit: Payer: Self-pay

## 2011-09-09 DIAGNOSIS — M549 Dorsalgia, unspecified: Secondary | ICD-10-CM

## 2011-09-09 MED ORDER — FENTANYL 25 MCG/HR TD PT72: 1.0000 | MEDICATED_PATCH | TRANSDERMAL | Status: DC

## 2011-09-19 ENCOUNTER — Other Ambulatory Visit: Payer: Self-pay

## 2011-09-19 MED ORDER — TEMAZEPAM 30 MG PO CAPS: 30.0000 mg | ORAL_CAPSULE | Freq: Every evening | ORAL | Status: DC | PRN

## 2011-09-20 DIAGNOSIS — Z981 Arthrodesis status: Secondary | ICD-10-CM | POA: Diagnosis not present

## 2011-09-20 DIAGNOSIS — F411 Generalized anxiety disorder: Secondary | ICD-10-CM | POA: Diagnosis not present

## 2011-09-20 DIAGNOSIS — Z87891 Personal history of nicotine dependence: Secondary | ICD-10-CM | POA: Diagnosis not present

## 2011-09-20 DIAGNOSIS — I10 Essential (primary) hypertension: Secondary | ICD-10-CM | POA: Diagnosis not present

## 2011-09-20 DIAGNOSIS — IMO0001 Reserved for inherently not codable concepts without codable children: Secondary | ICD-10-CM | POA: Diagnosis not present

## 2011-09-20 DIAGNOSIS — M5137 Other intervertebral disc degeneration, lumbosacral region: Secondary | ICD-10-CM | POA: Diagnosis not present

## 2011-09-20 DIAGNOSIS — F329 Major depressive disorder, single episode, unspecified: Secondary | ICD-10-CM | POA: Diagnosis not present

## 2011-09-20 DIAGNOSIS — G4700 Insomnia, unspecified: Secondary | ICD-10-CM | POA: Diagnosis not present

## 2011-09-20 DIAGNOSIS — Z79899 Other long term (current) drug therapy: Secondary | ICD-10-CM | POA: Diagnosis not present

## 2011-09-20 DIAGNOSIS — Z888 Allergy status to other drugs, medicaments and biological substances status: Secondary | ICD-10-CM | POA: Diagnosis not present

## 2011-10-06 ENCOUNTER — Telehealth: Payer: Self-pay | Admitting: Family Medicine

## 2011-10-06 ENCOUNTER — Ambulatory Visit (HOSPITAL_COMMUNITY)
Admission: RE | Admit: 2011-10-06 | Discharge: 2011-10-06 | Disposition: A | Discharge status: Home / Self Care | Payer: Medicare Other | Source: Ambulatory Visit | Attending: Family Medicine | Admitting: Family Medicine

## 2011-10-06 ENCOUNTER — Encounter: Payer: Self-pay | Admitting: Family Medicine

## 2011-10-06 ENCOUNTER — Ambulatory Visit (INDEPENDENT_AMBULATORY_CARE_PROVIDER_SITE_OTHER): Payer: Medicare Other | Admitting: Family Medicine

## 2011-10-06 VITALS — BP 160/92 | HR 88 | Resp 16 | Ht 65.0 in | Wt 150.4 lb

## 2011-10-06 DIAGNOSIS — I10 Essential (primary) hypertension: Secondary | ICD-10-CM | POA: Diagnosis not present

## 2011-10-06 DIAGNOSIS — Z1211 Encounter for screening for malignant neoplasm of colon: Secondary | ICD-10-CM

## 2011-10-06 DIAGNOSIS — K5900 Constipation, unspecified: Secondary | ICD-10-CM

## 2011-10-06 DIAGNOSIS — M549 Dorsalgia, unspecified: Secondary | ICD-10-CM | POA: Diagnosis not present

## 2011-10-06 DIAGNOSIS — F329 Major depressive disorder, single episode, unspecified: Secondary | ICD-10-CM

## 2011-10-06 DIAGNOSIS — R109 Unspecified abdominal pain: Secondary | ICD-10-CM | POA: Diagnosis not present

## 2011-10-06 DIAGNOSIS — A6000 Herpesviral infection of urogenital system, unspecified: Secondary | ICD-10-CM

## 2011-10-06 LAB — POC HEMOCCULT BLD/STL (OFFICE/1-CARD/DIAGNOSTIC): Fecal Occult Blood, POC: NEGATIVE

## 2011-10-06 MED ORDER — HYDROCODONE-ACETAMINOPHEN 10-325 MG PO TABS: 1.0000 | ORAL_TABLET | Freq: Four times a day (QID) | ORAL | Status: DC | PRN

## 2011-10-06 MED ORDER — ALPRAZOLAM 0.5 MG PO TABS: 0.5000 mg | ORAL_TABLET | Freq: Every day | ORAL | Status: DC

## 2011-10-06 MED ORDER — CITALOPRAM HYDROBROMIDE 20 MG PO TABS: ORAL_TABLET | ORAL | Status: DC

## 2011-10-06 MED ORDER — FENTANYL 50 MCG/HR TD PT72: 1.0000 | MEDICATED_PATCH | TRANSDERMAL | Status: DC

## 2011-10-06 MED ORDER — CLONIDINE HCL 0.3 MG PO TABS: ORAL_TABLET | ORAL | Status: DC

## 2011-10-06 NOTE — Patient Instructions (Addendum)
F/u in 6 weeks.   Blood pressure is high today, please ensure you take all medication prescribed at the same time every day.  You are referred for MRI of your back, and also to pain specialist for chronic pain management, dose increase on fentanyl today  You are to use OTC magnesium citrate as a laxative and continue daily softeners and high fiber diet.  You need an xray of the abdomen and are referred to Dr Kendell Bane re change in bowel movements for the past 6 to 8 weeks  Cut back on flour, bread and cake please

## 2011-10-06 NOTE — Progress Notes (Signed)
  Subjective:    Patient ID: Renee Waters, female    DOB: 11/15/1945, 66 y.o.   MRN: [id number]  HPI Pt had epidural on 8/13 for back pain, no relief, actually at a 9 or 10 most of the time. Is again seeing Dr Chaney Malling re chronic right hip pain, did see Dr Romeo Apple for 2nd opinion who pt says that he told her the hip was a problem, however , states that original surgeon states her back is the cause of her symptoms  No bowel movement in 2 weeks , and reports change in bowel movements with "pebbles" in the past 4 to 6 weeks. Will refer for GI re eval  Increased depression and stress in the anniversary month of her deceased Mom's birthday, but  overall coping well, not suicidal or homicidal  Review of Systems See HPI Denies recent fever or chills. Denies sinus pressure, nasal congestion, ear pain or sore throat. Denies chest congestion, productive cough or wheezing. Denies chest pains, palpitations and leg swelling Denies abdominal pain, nausea, vomiting,diarrhea or constipation.   Denies dysuria, frequency, hesitancy or incontinence.  Denies headaches, seizures, numbness, or tingling.  Denies skin break down or rash.        Objective:   Physical Exam  Patient alert and oriented and in no cardiopulmonary distress.  HEENT: No facial asymmetry, EOMI, no sinus tenderness,  oropharynx pink and moist.  Neck supple no adenopathy.  Chest: Clear to auscultation bilaterally.  CVS: S1, S2 no murmurs, no S3.  ABD: Soft non tender. Bowel sounds normal. Rectal:no mass, soft , brown, guaiac negative stool  Ext: No edema  MS: decreased  ROM spine, and  Right  Hip, adequate in knees  Skin: Intact, no ulcerations or rash noted.  Psych: Good eye contact, normal affect. Memory intact not anxious or depressed appearing.  CNS: CN 2-12 intact, power, tone and sensation normal throughout.      Assessment & Plan:

## 2011-10-07 MED ORDER — CLONIDINE HCL 0.3 MG PO TABS: ORAL_TABLET | ORAL | Status: DC

## 2011-10-07 MED ORDER — CITALOPRAM HYDROBROMIDE 20 MG PO TABS: ORAL_TABLET | ORAL | Status: DC

## 2011-10-07 NOTE — Telephone Encounter (Signed)
Sent in. canceleled the rxs at [facility]

## 2011-10-10 NOTE — Assessment & Plan Note (Signed)
Increased symptoms due to Mom's birthday being this monht, coping fairly well  overall

## 2011-10-10 NOTE — Assessment & Plan Note (Signed)
Uncontrolled, states elevation is due to pain and reports med compliance, will re check in 6 weeks. DASH diet and commitment to daily physical activity for a minimum of 30 minutes discussed and encouraged, as a part of hypertension management. The importance of attaining a healthy weight is also discussed.

## 2011-10-10 NOTE — Assessment & Plan Note (Signed)
Controlled, no change in medication  

## 2011-10-10 NOTE — Assessment & Plan Note (Signed)
Uncontrolled, deteriorated, increased pain, with radiation, ortho has advised pain in thigh is from back , ot hip, will refer to pain specialist, ands will order MRI

## 2011-10-10 NOTE — Assessment & Plan Note (Signed)
Increased constipation with passage of "balls" in recent times, approx 4 to 6 weeks, refer to GI for  eval

## 2011-10-12 DIAGNOSIS — M5380 Other specified dorsopathies, site unspecified: Secondary | ICD-10-CM | POA: Diagnosis not present

## 2011-10-12 DIAGNOSIS — M5137 Other intervertebral disc degeneration, lumbosacral region: Secondary | ICD-10-CM | POA: Diagnosis not present

## 2011-10-12 DIAGNOSIS — M1990 Unspecified osteoarthritis, unspecified site: Secondary | ICD-10-CM | POA: Diagnosis not present

## 2011-10-12 DIAGNOSIS — M48061 Spinal stenosis, lumbar region without neurogenic claudication: Secondary | ICD-10-CM | POA: Diagnosis not present

## 2011-10-13 ENCOUNTER — Telehealth: Payer: Self-pay | Admitting: Family Medicine

## 2011-10-14 ENCOUNTER — Other Ambulatory Visit: Payer: Self-pay

## 2011-10-14 DIAGNOSIS — M549 Dorsalgia, unspecified: Secondary | ICD-10-CM

## 2011-10-14 NOTE — Telephone Encounter (Signed)
Patient states the mag cit has ran its course and the diarrhea is gone now

## 2011-10-14 NOTE — Telephone Encounter (Signed)
Called and husband will have her call me back

## 2011-10-17 ENCOUNTER — Encounter: Payer: Self-pay | Admitting: Internal Medicine

## 2011-10-18 ENCOUNTER — Encounter: Payer: Self-pay | Admitting: Internal Medicine

## 2011-10-18 ENCOUNTER — Ambulatory Visit (INDEPENDENT_AMBULATORY_CARE_PROVIDER_SITE_OTHER): Payer: Medicare Other | Admitting: Internal Medicine

## 2011-10-18 VITALS — BP 122/79 | HR 79 | Temp 98.2°F | Ht 65.0 in | Wt 146.2 lb

## 2011-10-18 DIAGNOSIS — R198 Other specified symptoms and signs involving the digestive system and abdomen: Secondary | ICD-10-CM | POA: Diagnosis not present

## 2011-10-18 DIAGNOSIS — K5900 Constipation, unspecified: Secondary | ICD-10-CM | POA: Diagnosis not present

## 2011-10-18 DIAGNOSIS — R194 Change in bowel habit: Secondary | ICD-10-CM

## 2011-10-18 MED ORDER — PEG 3350-KCL-NA BICARB-NACL 420 G PO SOLR: 4000.0000 mL | ORAL | Status: AC

## 2011-10-18 NOTE — Progress Notes (Signed)
Primary Care Physician:  Syliva Overman, MD Primary Gastroenterologist:  Dr.   Pre-Procedure History & Physical: HPI:  Renee Waters is a 66 y.o. female here for further evaluation recent change in bowel habits. Typically has a bowel movement every other day. Recently became constipated when upwards of 2 weeks without a bowel movement. No relief with psyllium or MiraLax (one dose). Saw Dr. Lodema Hong. Prescribed magnesium citrate - within 2 days she had multiple bowel movements and, in fact, bouts of fecal incontinence. Over the past several days she returned back to her baseline with one formed bowel movement every other day. She continues to take Duragesic and hydrocodone on a regular basis. Also taking Flexeril. No rectal bleeding. Reflux symptoms well controlled on Nexium (history of short segment Barrett's). No family history of colon cancer. She had a negative screening colonoscopy nearly 10 years ago.   Past Medical History  Diagnosis Date  . Insomnia   . Chronic back pain   . Chronic neck pain   . Depression   . GERD (gastroesophageal reflux disease)   . Osteoarthritis   . Genital herpes   . Hypertension   . S/P colonoscopy June 2005    normal, no polyps  . S/P endoscopy June 2005, Oct 2009    2005: short-segment Barrett's, 2009: short-segment Barrett's  . Barrett's esophagus     Past Surgical History  Procedure Date  . Cervical disectomy 2002  . Shoulder arthroscopy 2008    left  . Right hip replacement 07/2010    went back in sept 2012 to fix  . Hernia repair 07/2010    Dr. Arlean Hopping  . Abdominal hysterectomy   . Esophagogastroduodenoscopy 11/29/2007    salmon-colored  tongue   longest stable at  3 cm, distal esophagus as described previously status post biopsy/ Hiatal hernia, otherwise normal stomach D1 and D2  . Esophagogastroduodenoscopy 01/06/11    short segment Barrett's esophagus s/p bx/Hiatal hernia    Prior to Admission medications   Medication Sig Start Date  End Date Taking? Authorizing Provider  acyclovir (ZOVIRAX) 800 MG tablet Take 800 mg by mouth 4 (four) times daily as needed. For breakouts   Yes Historical Provider, MD  ALPRAZolam Prudy Feeler) 0.5 MG tablet Take 1 tablet (0.5 mg total) by mouth at bedtime. 10/06/11 10/05/12 Yes Kerri Perches, MD  citalopram (CELEXA) 20 MG tablet TAKE 1 TABLET BY MOUTH EVERY DAY 10/07/11  Yes Kerri Perches, MD  cloNIDine (CATAPRES) 0.3 MG tablet TAKE 1 TABLET BY MOUTH TWICE A DAY 10/07/11  Yes Kerri Perches, MD  cyclobenzaprine (FLEXERIL) 10 MG tablet TAKE 1 TABLET BY MOUTH 3 TIMES A DAY AS NEEDED FOR MUSCLE SPASMS 02/22/11  Yes Kerri Perches, MD  cyclobenzaprine (FLEXERIL) 10 MG tablet Take 1 tablet (10 mg total) by mouth 3 (three) times daily as needed for muscle spasms. 06/28/11  Yes Kerri Perches, MD  cyclobenzaprine (FLEXERIL) 10 MG tablet TAKE 1 TABLET BY MOUTH 3 TIMES A DAY AS NEEDED FOR MUSCLE SPASMS 08/13/11  Yes Kerri Perches, MD  fentaNYL (DURAGESIC - DOSED MCG/HR) 25 MCG/HR Place 1 patch onto the skin every 3 (three) days.  09/20/11  Yes Historical Provider, MD  gabapentin (NEURONTIN) 300 MG capsule Take 600 mg by mouth 3 (three) times daily.    Yes Historical Provider, MD  HYDROcodone-acetaminophen (NORCO) 10-325 MG per tablet Take 1 tablet by mouth every 6 (six) hours as needed for pain. 10/06/11  Yes Kerri Perches, MD  Multiple  Vitamins-Minerals (MULTIVITAMINS THER. W/MINERALS) TABS Take 1 tablet by mouth daily.     Yes Historical Provider, MD  Naproxen-Esomeprazole (VIMOVO) 500-20 MG TBEC TAKE 1 TABLET BY MOUTH TWO TIMES DAILY 06/15/11  Yes Kerri Perches, MD  temazepam (RESTORIL) 30 MG capsule Take 1 capsule (30 mg total) by mouth at bedtime as needed. For sleep 09/19/11  Yes Kerri Perches, MD    Allergies as of 10/18/2011 - Review Complete 10/18/2011  Allergen Reaction Noted  . Tylox (oxycodone-acetaminophen) Other (See Comments) 09/27/2010    Family History  Problem  Relation Age of Onset  . Hypertension Mother   . Colon cancer Neg Hx   . Anesthesia problems Neg Hx   . Hypotension Neg Hx   . Malignant hyperthermia Neg Hx   . Pseudochol deficiency Neg Hx     History   Social History  . Marital Status: Married    Spouse Name: N/A    Number of Children: N/A  . Years of Education: N/A   Occupational History  . Not on file.   Social History Main Topics  . Smoking status: Former Smoker    Types: Cigarettes  . Smokeless tobacco: Not on file   Comment: quit in 2004  . Alcohol Use: Yes     socially, rare  . Drug Use: No  . Sexually Active: Not on file   Other Topics Concern  . Not on file   Social History Narrative  . No narrative on file    Review of Systems: See HPI, otherwise negative ROS  Physical Exam: BP 122/79  Pulse 79  Temp 98.2 F (36.8 C) (Temporal)  Ht 5\' 5"  (1.651 m)  Wt 146 lb 3.2 oz (66.316 kg)  BMI 24.33 kg/m2 General:   Alert,  Well-developed, well-nourished, pleasant and cooperative in NAD Skin:  Intact without significant lesions or rashes. Eyes:  Sclera clear, no icterus.   Conjunctiva pink. Ears:  Normal auditory acuity. Nose:  No deformity, discharge,  or lesions. Mouth:  No deformity or lesions. Neck:  Supple; no masses or thyromegaly. No significant cervical adenopathy. Lungs:  Clear throughout to auscultation.   No wheezes, crackles, or rhonchi. No acute distress. Heart:  Regular rate and rhythm; no murmurs, clicks, rubs,  or gallops. Abdomen: Non-distended, normal bowel sounds.  Soft and nontender without appreciable mass or hepatosplenomegaly.  Pulses:  Normal pulses noted. Extremities:  Without clubbing or edema.  Impression/Plan:  66 year old lady on chronic narcotic therapy recently became significantly obstipated with eventual relief utilizing magnesium citrate. Takes psyllium daily as her baseline bowel regimen. Didn't think MiraLax did anything for her but only took one dose. She would likely  benefit from a non-psyllium daily fiber supplement and utilization of MiraLax prophylactically. We discussed going ahead and updating CRC screening at this time.   Recommendations: Screening colonoscopy in the near future.The risks, benefits, limitations, alternatives and imponderables have been reviewed with the patient. Questions have been answered. All parties are agreeable. Further recommendations regarding her bowel regimen following colonoscopy.

## 2011-10-18 NOTE — Patient Instructions (Signed)
Will schedule screening colonoscopy in the near future

## 2011-10-19 ENCOUNTER — Inpatient Hospital Stay (HOSPITAL_COMMUNITY): Admission: RE | Admit: 2011-10-19 | Payer: Medicare Other | Source: Ambulatory Visit

## 2011-10-28 ENCOUNTER — Other Ambulatory Visit: Payer: Self-pay

## 2011-10-28 DIAGNOSIS — F411 Generalized anxiety disorder: Secondary | ICD-10-CM | POA: Diagnosis not present

## 2011-10-28 DIAGNOSIS — Z96649 Presence of unspecified artificial hip joint: Secondary | ICD-10-CM | POA: Diagnosis not present

## 2011-10-28 DIAGNOSIS — Z79899 Other long term (current) drug therapy: Secondary | ICD-10-CM | POA: Diagnosis not present

## 2011-10-28 DIAGNOSIS — M76899 Other specified enthesopathies of unspecified lower limb, excluding foot: Secondary | ICD-10-CM | POA: Diagnosis not present

## 2011-10-28 DIAGNOSIS — M7710 Lateral epicondylitis, unspecified elbow: Secondary | ICD-10-CM | POA: Diagnosis not present

## 2011-10-28 DIAGNOSIS — Z87891 Personal history of nicotine dependence: Secondary | ICD-10-CM | POA: Diagnosis not present

## 2011-10-28 DIAGNOSIS — G4700 Insomnia, unspecified: Secondary | ICD-10-CM | POA: Diagnosis not present

## 2011-10-28 DIAGNOSIS — G971 Other reaction to spinal and lumbar puncture: Secondary | ICD-10-CM | POA: Diagnosis not present

## 2011-10-28 DIAGNOSIS — M5137 Other intervertebral disc degeneration, lumbosacral region: Secondary | ICD-10-CM | POA: Diagnosis not present

## 2011-10-28 DIAGNOSIS — F329 Major depressive disorder, single episode, unspecified: Secondary | ICD-10-CM | POA: Diagnosis not present

## 2011-10-28 DIAGNOSIS — Z981 Arthrodesis status: Secondary | ICD-10-CM | POA: Diagnosis not present

## 2011-10-28 DIAGNOSIS — Z886 Allergy status to analgesic agent status: Secondary | ICD-10-CM | POA: Diagnosis not present

## 2011-10-28 DIAGNOSIS — I10 Essential (primary) hypertension: Secondary | ICD-10-CM | POA: Diagnosis not present

## 2011-10-28 DIAGNOSIS — M1990 Unspecified osteoarthritis, unspecified site: Secondary | ICD-10-CM | POA: Diagnosis not present

## 2011-10-28 MED ORDER — FENTANYL 25 MCG/HR TD PT72: 1.0000 | MEDICATED_PATCH | TRANSDERMAL | Status: DC

## 2011-11-01 ENCOUNTER — Encounter (HOSPITAL_COMMUNITY): Payer: Self-pay | Admitting: Pharmacy Technician

## 2011-11-03 ENCOUNTER — Telehealth: Payer: Self-pay | Admitting: Family Medicine

## 2011-11-03 DIAGNOSIS — M549 Dorsalgia, unspecified: Secondary | ICD-10-CM

## 2011-11-03 MED ORDER — FENTANYL 50 MCG/HR TD PT72: 1.0000 | MEDICATED_PATCH | TRANSDERMAL | Status: DC

## 2011-11-03 NOTE — Telephone Encounter (Signed)
Aware its ready

## 2011-11-08 MED ORDER — SODIUM CHLORIDE 0.45 % IV SOLN
INTRAVENOUS | Status: DC
  Administered 2011-11-09: 14:00:00 via INTRAVENOUS

## 2011-11-09 ENCOUNTER — Ambulatory Visit (HOSPITAL_COMMUNITY)
Admission: RE | Admit: 2011-11-09 | Discharge: 2011-11-09 | Disposition: A | Discharge status: Home / Self Care | Payer: Medicare Other | Source: Ambulatory Visit | Attending: Internal Medicine | Admitting: Internal Medicine

## 2011-11-09 ENCOUNTER — Encounter (HOSPITAL_COMMUNITY): Payer: Self-pay

## 2011-11-09 ENCOUNTER — Encounter (HOSPITAL_COMMUNITY)
Admission: RE | Disposition: A | Discharge status: Home / Self Care | Payer: Self-pay | Source: Ambulatory Visit | Attending: Internal Medicine

## 2011-11-09 DIAGNOSIS — Z1211 Encounter for screening for malignant neoplasm of colon: Secondary | ICD-10-CM | POA: Insufficient documentation

## 2011-11-09 DIAGNOSIS — K6389 Other specified diseases of intestine: Secondary | ICD-10-CM | POA: Diagnosis not present

## 2011-11-09 DIAGNOSIS — I10 Essential (primary) hypertension: Secondary | ICD-10-CM | POA: Insufficient documentation

## 2011-11-09 DIAGNOSIS — R194 Change in bowel habit: Secondary | ICD-10-CM

## 2011-11-09 DIAGNOSIS — K5900 Constipation, unspecified: Secondary | ICD-10-CM

## 2011-11-09 HISTORY — PX: COLONOSCOPY: SHX5424

## 2011-11-09 SURGERY — COLONOSCOPY
Anesthesia: Moderate Sedation

## 2011-11-09 MED ORDER — STERILE WATER FOR IRRIGATION IR SOLN
Status: DC | PRN
  Administered 2011-11-09: 14:00:00

## 2011-11-09 MED ORDER — MIDAZOLAM HCL 5 MG/5ML IJ SOLN
INTRAMUSCULAR | Status: DC | PRN
  Administered 2011-11-09 (×2): 1 mg via INTRAVENOUS
  Administered 2011-11-09 (×4): 2 mg via INTRAVENOUS

## 2011-11-09 MED ORDER — MEPERIDINE HCL 100 MG/ML IJ SOLN
INTRAMUSCULAR | Status: AC
  Filled 2011-11-09: qty 2

## 2011-11-09 MED ORDER — MEPERIDINE HCL 100 MG/ML IJ SOLN
INTRAMUSCULAR | Status: DC | PRN
  Administered 2011-11-09: 50 mg via INTRAVENOUS
  Administered 2011-11-09: 25 mg via INTRAVENOUS
  Administered 2011-11-09: 50 mg via INTRAVENOUS
  Administered 2011-11-09: 25 mg via INTRAVENOUS
  Administered 2011-11-09: 50 mg via INTRAVENOUS

## 2011-11-09 MED ORDER — MIDAZOLAM HCL 5 MG/5ML IJ SOLN
INTRAMUSCULAR | Status: AC
  Filled 2011-11-09: qty 10

## 2011-11-09 NOTE — H&P (View-Only) (Signed)
Primary Care Physician:  Margaret Simpson, MD Primary Gastroenterologist:  Dr.   Pre-Procedure History & Physical: HPI:  Renee Waters is a 66 y.o. female here for further evaluation recent change in bowel habits. Typically has a bowel movement every other day. Recently became constipated when upwards of 2 weeks without a bowel movement. No relief with psyllium or MiraLax (one dose). Saw Dr. Simpson. Prescribed magnesium citrate - within 2 days she had multiple bowel movements and, in fact, bouts of fecal incontinence. Over the past several days she returned back to her baseline with one formed bowel movement every other day. She continues to take Duragesic and hydrocodone on a regular basis. Also taking Flexeril. No rectal bleeding. Reflux symptoms well controlled on Nexium (history of short segment Barrett's). No family history of colon cancer. She had a negative screening colonoscopy nearly 10 years ago.   Past Medical History  Diagnosis Date  . Insomnia   . Chronic back pain   . Chronic neck pain   . Depression   . GERD (gastroesophageal reflux disease)   . Osteoarthritis   . Genital herpes   . Hypertension   . S/P colonoscopy June 2005    normal, no polyps  . S/P endoscopy June 2005, Oct 2009    2005: short-segment Barrett's, 2009: short-segment Barrett's  . Barrett's esophagus     Past Surgical History  Procedure Date  . Cervical disectomy 2002  . Shoulder arthroscopy 2008    left  . Right hip replacement 07/2010    went back in sept 2012 to fix  . Hernia repair 07/2010    Dr. Beachman  . Abdominal hysterectomy   . Esophagogastroduodenoscopy 11/29/2007    salmon-colored  tongue   longest stable at  3 cm, distal esophagus as described previously status post biopsy/ Hiatal hernia, otherwise normal stomach D1 and D2  . Esophagogastroduodenoscopy 01/06/11    short segment Barrett's esophagus s/p bx/Hiatal hernia    Prior to Admission medications   Medication Sig Start Date  End Date Taking? Authorizing Provider  acyclovir (ZOVIRAX) 800 MG tablet Take 800 mg by mouth 4 (four) times daily as needed. For breakouts   Yes Historical Provider, MD  ALPRAZolam (XANAX) 0.5 MG tablet Take 1 tablet (0.5 mg total) by mouth at bedtime. 10/06/11 10/05/12 Yes Margaret E Simpson, MD  citalopram (CELEXA) 20 MG tablet TAKE 1 TABLET BY MOUTH EVERY DAY 10/07/11  Yes Margaret E Simpson, MD  cloNIDine (CATAPRES) 0.3 MG tablet TAKE 1 TABLET BY MOUTH TWICE A DAY 10/07/11  Yes Margaret E Simpson, MD  cyclobenzaprine (FLEXERIL) 10 MG tablet TAKE 1 TABLET BY MOUTH 3 TIMES A DAY AS NEEDED FOR MUSCLE SPASMS 02/22/11  Yes Margaret E Simpson, MD  cyclobenzaprine (FLEXERIL) 10 MG tablet Take 1 tablet (10 mg total) by mouth 3 (three) times daily as needed for muscle spasms. 06/28/11  Yes Margaret E Simpson, MD  cyclobenzaprine (FLEXERIL) 10 MG tablet TAKE 1 TABLET BY MOUTH 3 TIMES A DAY AS NEEDED FOR MUSCLE SPASMS 08/13/11  Yes Margaret E Simpson, MD  fentaNYL (DURAGESIC - DOSED MCG/HR) 25 MCG/HR Place 1 patch onto the skin every 3 (three) days.  09/20/11  Yes Historical Provider, MD  gabapentin (NEURONTIN) 300 MG capsule Take 600 mg by mouth 3 (three) times daily.    Yes Historical Provider, MD  HYDROcodone-acetaminophen (NORCO) 10-325 MG per tablet Take 1 tablet by mouth every 6 (six) hours as needed for pain. 10/06/11  Yes Margaret E Simpson, MD  Multiple   Vitamins-Minerals (MULTIVITAMINS THER. W/MINERALS) TABS Take 1 tablet by mouth daily.     Yes Historical Provider, MD  Naproxen-Esomeprazole (VIMOVO) 500-20 MG TBEC TAKE 1 TABLET BY MOUTH TWO TIMES DAILY 06/15/11  Yes Margaret E Simpson, MD  temazepam (RESTORIL) 30 MG capsule Take 1 capsule (30 mg total) by mouth at bedtime as needed. For sleep 09/19/11  Yes Margaret E Simpson, MD    Allergies as of 10/18/2011 - Review Complete 10/18/2011  Allergen Reaction Noted  . Tylox (oxycodone-acetaminophen) Other (See Comments) 09/27/2010    Family History  Problem  Relation Age of Onset  . Hypertension Mother   . Colon cancer Neg Hx   . Anesthesia problems Neg Hx   . Hypotension Neg Hx   . Malignant hyperthermia Neg Hx   . Pseudochol deficiency Neg Hx     History   Social History  . Marital Status: Married    Spouse Name: N/A    Number of Children: N/A  . Years of Education: N/A   Occupational History  . Not on file.   Social History Main Topics  . Smoking status: Former Smoker    Types: Cigarettes  . Smokeless tobacco: Not on file   Comment: quit in 2004  . Alcohol Use: Yes     socially, rare  . Drug Use: No  . Sexually Active: Not on file   Other Topics Concern  . Not on file   Social History Narrative  . No narrative on file    Review of Systems: See HPI, otherwise negative ROS  Physical Exam: BP 122/79  Pulse 79  Temp 98.2 F (36.8 C) (Temporal)  Ht 5' 5" (1.651 m)  Wt 146 lb 3.2 oz (66.316 kg)  BMI 24.33 kg/m2 General:   Alert,  Well-developed, well-nourished, pleasant and cooperative in NAD Skin:  Intact without significant lesions or rashes. Eyes:  Sclera clear, no icterus.   Conjunctiva pink. Ears:  Normal auditory acuity. Nose:  No deformity, discharge,  or lesions. Mouth:  No deformity or lesions. Neck:  Supple; no masses or thyromegaly. No significant cervical adenopathy. Lungs:  Clear throughout to auscultation.   No wheezes, crackles, or rhonchi. No acute distress. Heart:  Regular rate and rhythm; no murmurs, clicks, rubs,  or gallops. Abdomen: Non-distended, normal bowel sounds.  Soft and nontender without appreciable mass or hepatosplenomegaly.  Pulses:  Normal pulses noted. Extremities:  Without clubbing or edema.  Impression/Plan:  66-year-old lady on chronic narcotic therapy recently became significantly obstipated with eventual relief utilizing magnesium citrate. Takes psyllium daily as her baseline bowel regimen. Didn't think MiraLax did anything for her but only took one dose. She would likely  benefit from a non-psyllium daily fiber supplement and utilization of MiraLax prophylactically. We discussed going ahead and updating CRC screening at this time.   Recommendations: Screening colonoscopy in the near future.The risks, benefits, limitations, alternatives and imponderables have been reviewed with the patient. Questions have been answered. All parties are agreeable. Further recommendations regarding her bowel regimen following colonoscopy. 

## 2011-11-09 NOTE — Op Note (Signed)
[facility] [address] [place], [address]   COLONOSCOPY PROCEDURE REPORT  PATIENT: Renee Waters, Renee Waters  MR#:         [id number] BIRTHDATE: 1945-07-30 , 65  yrs. old GENDER: Female ENDOSCOPIST: R.  Roetta Sessions, MD FACP FACG REFERRED BY:  Syliva Overman, M.D. PROCEDURE DATE:  11/09/2011 PROCEDURE:    screening colonoscopy   INDICATIONS: colorectal cancer screening/refractory constipation  INFORMED CONSENT:  The risks, benefits, alternatives and imponderables including but not limited to bleeding, perforation as well as the possibility of a missed lesion have been reviewed.  The potential for biopsy, lesion removal, etc. have also been discussed.  Questions have been answered.  All parties agreeable. Please see the history and physical in the medical record for more information.  MEDICATIONS: Demerol 200 mg IV and Versed 10 mg IV in divided doses  DESCRIPTION OF PROCEDURE:  After a digital rectal exam was performed, the EC-[id number] ([id number])  colonoscope was advanced from the anus through the rectum and colon to the area of the cecum, ileocecal valve and appendiceal orifice.  The cecum was deeply intubated.  These structures were well-seen and photographed for the record.  From the level of the cecum and ileocecal valve, the scope was slowly and cautiously withdrawn.  The mucosal surfaces were carefully surveyed utilizing scope tip deflection to facilitate fold flattening as needed.  The scope was pulled down into the rectum where a thorough examination including retroflexion was performed.    FINDINGS:  Adequate preparation. Normal rectum. 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  THERAPEUTIC / DIAGNOSTIC MANEUVERS PERFORMED:  none  COMPLICATIONS:  none  CECAL WITHDRAWAL TIME:  8 minute  IMPRESSION:  Melanosis coli  RECOMMENDATIONS: Stop MiraLax; begin Linzness 145 mcg capsule  daily. Office visit in 8 weeks. Repeat screening colonoscopy in 10 years   _______________________________ eSigned:  R. Roetta Sessions, MD FACP [facility] 11/09/2011 2:56 PM   CC:

## 2011-11-09 NOTE — Interval H&P Note (Signed)
History and Physical Interval Note:  11/09/2011 2:05 PM  Renee Waters  has presented today for surgery, with the diagnosis of CHANGE IN BOWEL HABITS  The various methods of treatment have been discussed with the patient and family. After consideration of risks, benefits and other options for treatment, the patient has consented to  Procedure(s) (LRB) with comments: COLONOSCOPY (N/A) - 1:45 as a surgical intervention .  The patient's history has been reviewed, patient examined, no change in status, stable for surgery.  I have reviewed the patient's chart and labs.  Questions were answered to the patient's satisfaction.     Eula Listen

## 2011-11-10 ENCOUNTER — Other Ambulatory Visit: Payer: Self-pay | Admitting: Family Medicine

## 2011-11-16 ENCOUNTER — Encounter (HOSPITAL_COMMUNITY): Payer: Self-pay | Admitting: Internal Medicine

## 2011-11-22 ENCOUNTER — Other Ambulatory Visit: Payer: Self-pay

## 2011-11-22 DIAGNOSIS — M549 Dorsalgia, unspecified: Secondary | ICD-10-CM

## 2011-11-22 MED ORDER — FENTANYL 50 MCG/HR TD PT72: 1.0000 | MEDICATED_PATCH | TRANSDERMAL | Status: DC

## 2011-11-30 ENCOUNTER — Telehealth: Payer: Self-pay

## 2011-11-30 DIAGNOSIS — M48061 Spinal stenosis, lumbar region without neurogenic claudication: Secondary | ICD-10-CM | POA: Diagnosis not present

## 2011-11-30 DIAGNOSIS — M1990 Unspecified osteoarthritis, unspecified site: Secondary | ICD-10-CM | POA: Diagnosis not present

## 2011-11-30 DIAGNOSIS — M5137 Other intervertebral disc degeneration, lumbosacral region: Secondary | ICD-10-CM | POA: Diagnosis not present

## 2011-11-30 DIAGNOSIS — M5380 Other specified dorsopathies, site unspecified: Secondary | ICD-10-CM | POA: Diagnosis not present

## 2011-12-05 ENCOUNTER — Other Ambulatory Visit: Payer: Self-pay | Admitting: Family Medicine

## 2011-12-05 NOTE — Telephone Encounter (Signed)
Noted and sent.  

## 2011-12-05 NOTE — Telephone Encounter (Signed)
D/c order for hydrocodone to be stamped with my sig and sent to her pharmacy.Also i have removed the fentanyl from her med list , she needs to be removed from the narc list and no more narc scripts from here

## 2011-12-05 NOTE — Telephone Encounter (Signed)
Please remove [employer] from our [employer]

## 2011-12-06 ENCOUNTER — Ambulatory Visit (INDEPENDENT_AMBULATORY_CARE_PROVIDER_SITE_OTHER): Payer: Medicare Other | Admitting: Family Medicine

## 2011-12-06 ENCOUNTER — Encounter: Payer: Self-pay | Admitting: Family Medicine

## 2011-12-06 VITALS — BP 138/92 | HR 98 | Resp 18 | Ht 65.0 in | Wt 147.0 lb

## 2011-12-06 DIAGNOSIS — Z23 Encounter for immunization: Secondary | ICD-10-CM

## 2011-12-06 DIAGNOSIS — G4700 Insomnia, unspecified: Secondary | ICD-10-CM

## 2011-12-06 DIAGNOSIS — M1990 Unspecified osteoarthritis, unspecified site: Secondary | ICD-10-CM | POA: Diagnosis not present

## 2011-12-06 DIAGNOSIS — R5383 Other fatigue: Secondary | ICD-10-CM | POA: Diagnosis not present

## 2011-12-06 DIAGNOSIS — R5381 Other malaise: Secondary | ICD-10-CM | POA: Diagnosis not present

## 2011-12-06 DIAGNOSIS — F329 Major depressive disorder, single episode, unspecified: Secondary | ICD-10-CM

## 2011-12-06 DIAGNOSIS — A6000 Herpesviral infection of urogenital system, unspecified: Secondary | ICD-10-CM

## 2011-12-06 DIAGNOSIS — I10 Essential (primary) hypertension: Secondary | ICD-10-CM | POA: Diagnosis not present

## 2011-12-06 DIAGNOSIS — M549 Dorsalgia, unspecified: Secondary | ICD-10-CM

## 2011-12-06 DIAGNOSIS — K219 Gastro-esophageal reflux disease without esophagitis: Secondary | ICD-10-CM

## 2011-12-06 DIAGNOSIS — Z13220 Encounter for screening for lipoid disorders: Secondary | ICD-10-CM | POA: Diagnosis not present

## 2011-12-06 MED ORDER — ALPRAZOLAM 0.5 MG PO TABS: 0.5000 mg | ORAL_TABLET | Freq: Every day | ORAL | Status: DC

## 2011-12-06 NOTE — Patient Instructions (Addendum)
Annual wellness  in 4 month  Please try to start daily physical activity as able for approx 15 minutes daily then increase gradually  I am very sorry about your Aunt's passing  Pain meds percocet and fentanyl are from Dr Laurian Brim  Flu vaccine today  Mammogram due in November, please schedule  Fasting CBc, chem 7, lipids, Vit D and TSH in 4 months, just before next visit  Change clonidine to one and a half at night and half in the morning, this will be better tolerated

## 2011-12-06 NOTE — Progress Notes (Signed)
  Subjective:    Patient ID: Renee Waters, female    DOB: 09/19/1945, 66 y.o.   MRN: [id number]  HPI The PT is here for follow up and re-evaluation of chronic medical conditions, medication management and review of any available recent lab and radiology data.  Preventive health is updated, specifically  Cancer screening and Immunization.   Questions or concerns regarding consultations or procedures which the PT has had in the interim are  Addressed.Has now been turned over to pain management by her ortho Doc for chronic ongoing back and hip pain The PT denies any adverse reactions to current medications since the last visit.  There are no new concerns.  Recently lost aunt and is grieving over this     Review of Systems See HPI Denies recent fever or chills. Denies sinus pressure, nasal congestion, ear pain or sore throat. Denies chest congestion, productive cough or wheezing. Denies chest pains, palpitations and leg swelling Denies abdominal pain, nausea, vomiting,diarrhea or constipation.   Denies dysuria, frequency, hesitancy or incontinence.  Denies headaches, seizures, numbness, or tin gling. Denies uncontrolled  depression, anxiety or insomnia. Denies skin break down or rash.        Objective:   Physical Exam Patient alert and oriented and in no cardiopulmonary distress.  HEENT: No facial asymmetry, EOMI, no sinus tenderness,  oropharynx pink and moist.  Neck supple no adenopathy.  Chest: Clear to auscultation bilaterally.  CVS: S1, S2 no murmurs, no S3.  ABD: Soft non tender. Bowel sounds normal.  Ext: No edema  MS: decreased  ROM spine, shoulders, hips and knees.  Skin: Intact, no ulcerations or rash noted.  Psych: Good eye contact, normal affect. Memory intact not anxious mildly tearful and or depressed appearing.  CNS: CN 2-12 intact, power, tone and sensation normal throughout.        Assessment & Plan:

## 2011-12-18 NOTE — Assessment & Plan Note (Signed)
Uncontrolled, managed through pain clinic

## 2011-12-18 NOTE — Assessment & Plan Note (Signed)
Slightly increased due to recent death in family, overall well controlled

## 2011-12-18 NOTE — Assessment & Plan Note (Signed)
Controlled, no change in medication DASH diet and commitment to daily physical activity for a minimum of 30 minutes discussed and encouraged, as a part of hypertension management. The importance of attaining a healthy weight is also discussed.  

## 2011-12-18 NOTE — Assessment & Plan Note (Signed)
Sleep hygiene reviewed, continue med 

## 2011-12-18 NOTE — Assessment & Plan Note (Signed)
On chronic suppression.no recent flare

## 2011-12-18 NOTE — Assessment & Plan Note (Signed)
Controlled, no change in medication  

## 2011-12-19 DIAGNOSIS — M5137 Other intervertebral disc degeneration, lumbosacral region: Secondary | ICD-10-CM | POA: Diagnosis not present

## 2011-12-19 DIAGNOSIS — M48061 Spinal stenosis, lumbar region without neurogenic claudication: Secondary | ICD-10-CM | POA: Diagnosis not present

## 2011-12-19 DIAGNOSIS — M5380 Other specified dorsopathies, site unspecified: Secondary | ICD-10-CM | POA: Diagnosis not present

## 2011-12-19 DIAGNOSIS — M1990 Unspecified osteoarthritis, unspecified site: Secondary | ICD-10-CM | POA: Diagnosis not present

## 2011-12-23 DIAGNOSIS — M546 Pain in thoracic spine: Secondary | ICD-10-CM | POA: Diagnosis not present

## 2011-12-23 DIAGNOSIS — M5137 Other intervertebral disc degeneration, lumbosacral region: Secondary | ICD-10-CM | POA: Diagnosis not present

## 2011-12-23 DIAGNOSIS — M999 Biomechanical lesion, unspecified: Secondary | ICD-10-CM | POA: Diagnosis not present

## 2011-12-26 ENCOUNTER — Telehealth: Payer: Self-pay | Admitting: Family Medicine

## 2011-12-26 DIAGNOSIS — R5383 Other fatigue: Secondary | ICD-10-CM | POA: Diagnosis not present

## 2011-12-26 DIAGNOSIS — I10 Essential (primary) hypertension: Secondary | ICD-10-CM | POA: Diagnosis not present

## 2011-12-26 LAB — BASIC METABOLIC PANEL
BUN: 19 mg/dL (ref 6–23)
Calcium: 9.4 mg/dL (ref 8.4–10.5)
Creat: 0.89 mg/dL (ref 0.50–1.10)

## 2011-12-26 LAB — CBC
HCT: 36.3 % (ref 36.0–46.0)
MCHC: 33.6 g/dL (ref 30.0–36.0)
MCV: 96 fL (ref 78.0–100.0)
RDW: 15.2 % (ref 11.5–15.5)

## 2011-12-26 LAB — TSH: TSH: 2.927 u[IU]/mL (ref 0.350–4.500)

## 2011-12-26 NOTE — Telephone Encounter (Signed)
Chripractor wants to know if there is any infection swollen from her  feet to neck please call patient and let her know

## 2011-12-26 NOTE — Telephone Encounter (Signed)
She states after she had accupuncture Friday at the chiropractor that she started swelling up. I advised she come in for appt but she said she is just going to get her labwork done and see if that shows anything but she did not want to come in right now. Told her if it got any worse to get appt or ED

## 2011-12-27 NOTE — Telephone Encounter (Signed)
Noted and agree. 

## 2011-12-27 NOTE — Telephone Encounter (Signed)
FYI- Patient called and states ever since she had the epidural injections she has been swollen all over. Started this am having SOB. Advised ED. Patient agreed

## 2012-01-02 DIAGNOSIS — M999 Biomechanical lesion, unspecified: Secondary | ICD-10-CM | POA: Diagnosis not present

## 2012-01-02 DIAGNOSIS — M5137 Other intervertebral disc degeneration, lumbosacral region: Secondary | ICD-10-CM | POA: Diagnosis not present

## 2012-01-02 DIAGNOSIS — M546 Pain in thoracic spine: Secondary | ICD-10-CM | POA: Diagnosis not present

## 2012-01-11 DIAGNOSIS — M25559 Pain in unspecified hip: Secondary | ICD-10-CM | POA: Diagnosis not present

## 2012-01-13 DIAGNOSIS — M25559 Pain in unspecified hip: Secondary | ICD-10-CM | POA: Diagnosis not present

## 2012-01-24 DIAGNOSIS — M25559 Pain in unspecified hip: Secondary | ICD-10-CM | POA: Diagnosis not present

## 2012-01-25 DIAGNOSIS — M5137 Other intervertebral disc degeneration, lumbosacral region: Secondary | ICD-10-CM | POA: Diagnosis not present

## 2012-01-25 DIAGNOSIS — M1990 Unspecified osteoarthritis, unspecified site: Secondary | ICD-10-CM | POA: Diagnosis not present

## 2012-01-25 DIAGNOSIS — M5380 Other specified dorsopathies, site unspecified: Secondary | ICD-10-CM | POA: Diagnosis not present

## 2012-01-25 DIAGNOSIS — M48061 Spinal stenosis, lumbar region without neurogenic claudication: Secondary | ICD-10-CM | POA: Diagnosis not present

## 2012-01-26 ENCOUNTER — Other Ambulatory Visit: Payer: Self-pay | Admitting: Family Medicine

## 2012-01-26 DIAGNOSIS — M25559 Pain in unspecified hip: Secondary | ICD-10-CM | POA: Diagnosis not present

## 2012-02-07 DIAGNOSIS — M25559 Pain in unspecified hip: Secondary | ICD-10-CM | POA: Diagnosis not present

## 2012-02-09 DIAGNOSIS — M25559 Pain in unspecified hip: Secondary | ICD-10-CM | POA: Diagnosis not present

## 2012-02-14 DIAGNOSIS — M25559 Pain in unspecified hip: Secondary | ICD-10-CM | POA: Diagnosis not present

## 2012-02-27 ENCOUNTER — Other Ambulatory Visit: Payer: Self-pay | Admitting: Family Medicine

## 2012-02-27 DIAGNOSIS — M48061 Spinal stenosis, lumbar region without neurogenic claudication: Secondary | ICD-10-CM | POA: Diagnosis not present

## 2012-02-27 DIAGNOSIS — M5137 Other intervertebral disc degeneration, lumbosacral region: Secondary | ICD-10-CM | POA: Diagnosis not present

## 2012-02-27 DIAGNOSIS — M1990 Unspecified osteoarthritis, unspecified site: Secondary | ICD-10-CM | POA: Diagnosis not present

## 2012-02-27 DIAGNOSIS — M5380 Other specified dorsopathies, site unspecified: Secondary | ICD-10-CM | POA: Diagnosis not present

## 2012-02-28 ENCOUNTER — Telehealth: Payer: Self-pay | Admitting: Family Medicine

## 2012-02-28 ENCOUNTER — Other Ambulatory Visit: Payer: Self-pay | Admitting: Family Medicine

## 2012-02-28 DIAGNOSIS — Z139 Encounter for screening, unspecified: Secondary | ICD-10-CM

## 2012-03-05 ENCOUNTER — Ambulatory Visit (HOSPITAL_COMMUNITY)
Admission: RE | Admit: 2012-03-05 | Discharge: 2012-03-05 | Disposition: A | Discharge status: Home / Self Care | Payer: Medicare Other | Source: Ambulatory Visit | Attending: Family Medicine | Admitting: Family Medicine

## 2012-03-05 DIAGNOSIS — Z1231 Encounter for screening mammogram for malignant neoplasm of breast: Secondary | ICD-10-CM | POA: Diagnosis not present

## 2012-03-05 DIAGNOSIS — Z139 Encounter for screening, unspecified: Secondary | ICD-10-CM

## 2012-03-15 ENCOUNTER — Telehealth: Payer: Self-pay | Admitting: Family Medicine

## 2012-03-15 ENCOUNTER — Encounter: Payer: Self-pay | Admitting: Family Medicine

## 2012-03-15 ENCOUNTER — Ambulatory Visit (INDEPENDENT_AMBULATORY_CARE_PROVIDER_SITE_OTHER): Payer: Medicare Other | Admitting: Family Medicine

## 2012-03-15 VITALS — BP 140/100 | HR 85 | Resp 18 | Wt 157.0 lb

## 2012-03-15 DIAGNOSIS — M25551 Pain in right hip: Secondary | ICD-10-CM | POA: Insufficient documentation

## 2012-03-15 DIAGNOSIS — M25559 Pain in unspecified hip: Secondary | ICD-10-CM

## 2012-03-15 DIAGNOSIS — G4700 Insomnia, unspecified: Secondary | ICD-10-CM

## 2012-03-15 DIAGNOSIS — I10 Essential (primary) hypertension: Secondary | ICD-10-CM

## 2012-03-15 DIAGNOSIS — M899 Disorder of bone, unspecified: Secondary | ICD-10-CM | POA: Diagnosis not present

## 2012-03-15 DIAGNOSIS — R52 Pain, unspecified: Secondary | ICD-10-CM | POA: Insufficient documentation

## 2012-03-15 DIAGNOSIS — Z79899 Other long term (current) drug therapy: Secondary | ICD-10-CM | POA: Diagnosis not present

## 2012-03-15 DIAGNOSIS — F329 Major depressive disorder, single episode, unspecified: Secondary | ICD-10-CM

## 2012-03-15 DIAGNOSIS — E559 Vitamin D deficiency, unspecified: Secondary | ICD-10-CM

## 2012-03-15 DIAGNOSIS — M949 Disorder of cartilage, unspecified: Secondary | ICD-10-CM

## 2012-03-15 NOTE — Telephone Encounter (Signed)
Patient is aware 

## 2012-03-15 NOTE — Patient Instructions (Addendum)
F/u in 3 month, call if you need me before    You are referred to orthopedics in [place] re the right hip pain  It is important you have the nerve testing done to evaluate the left hand tingling   Blood pressure is high today take all the blood pressure medication prescribed at bedtime, since you do not tolerate the half tab in the morning  Fasting lipid and vit D as soon as possible  You need to take calcium with D 1200mg /1000IU once daily for bone health, and one baby aspirin 81mg  one daily to reduce stroke risk

## 2012-03-15 NOTE — Telephone Encounter (Signed)
Patient was seen in office today.  

## 2012-03-15 NOTE — Progress Notes (Signed)
  Subjective:    Patient ID: Renee Waters, female    DOB: 22-Nov-1945, 67 y.o.   MRN: [id number]  HPI Pt in stating she is here for advice. Recently saw orthopod who worked on her right hip, was told he can do nothing more for her hip, she is being treated by pain clinic, on dilaudid which she states is not controlling her pain. Recently , in the past approx  4 months, she has noted left upper extremity tingling  And pain, which awakens her at night , she has been referred for nerve conduction testing, and I encourage her to follow through with this She is tearful and depressed stating everyone is getting tired of her complaints, she is not suicidal or homicidal She is non compliant with her BP med by half tablet and pressure is elevated  Review of Systems See HPI Denies recent fever or chills. Denies sinus pressure, nasal congestion, ear pain or sore throat. Denies chest congestion, productive cough or wheezing. Denies chest pains, palpitations and leg swelling Denies abdominal pain, nausea, vomiting,diarrhea or constipation.   Denies dysuria, frequency, hesitancy or incontinence.  Denies headaches, seizures, numbness, or tingling. Chronic  depression, anxiety  Worsened, though not suicidal or homicidal, takes medication for insomnia. Denies skin break down or rash.        Objective:   Physical Exam  Patient alert and oriented and in no cardiopulmonary distress.  HEENT: No facial asymmetry, EOMI, no sinus tenderness,  oropharynx pink and moist.  Neck adequate ROM no adenopathy.  Chest: Clear to auscultation bilaterally.  CVS: S1, S2 no murmurs, no S3.  ABD: Soft non tender. Bowel sounds normal.  Ext: No edema  MS: decreased ROM spine, and right hip  Skin: Intact, no ulcerations or rash noted.  Psych: Good eye contact, tearful affect. Memory intact  depressed appearing.  CNS: CN 2-12 intact, power, tone and sensation normal throughout.       Assessment & Plan:

## 2012-03-16 DIAGNOSIS — E559 Vitamin D deficiency, unspecified: Secondary | ICD-10-CM | POA: Insufficient documentation

## 2012-03-16 LAB — VITAMIN D 25 HYDROXY (VIT D DEFICIENCY, FRACTURES): Vit D, 25-Hydroxy: 16 ng/mL — ABNORMAL LOW (ref 30–89)

## 2012-03-17 NOTE — Assessment & Plan Note (Signed)
Uncontrolled, secondary to medical non compliance DASH diet and commitment to daily physical activity for a minimum of 30 minutes discussed and encouraged, as a part of hypertension management. The importance of attaining a healthy weight is also discussed.

## 2012-03-17 NOTE — Assessment & Plan Note (Signed)
Deteriorated due to chronic pain, not suicidal or homicidal No interest  In psychotherpy

## 2012-03-17 NOTE — Assessment & Plan Note (Signed)
Pt c/o ongoing and increasing right hip pain which is debilitating, she is referred for a 3rd opinion per her request

## 2012-03-17 NOTE — Assessment & Plan Note (Signed)
Sleep hygiene reviewed, continue current med

## 2012-03-17 NOTE — Assessment & Plan Note (Signed)
Pt has severe arthritis and is vit D def, needs to take prescription vit D till corrected

## 2012-03-20 DIAGNOSIS — H2510 Age-related nuclear cataract, unspecified eye: Secondary | ICD-10-CM | POA: Diagnosis not present

## 2012-03-20 DIAGNOSIS — H35379 Puckering of macula, unspecified eye: Secondary | ICD-10-CM | POA: Diagnosis not present

## 2012-03-26 DIAGNOSIS — M25549 Pain in joints of unspecified hand: Secondary | ICD-10-CM | POA: Diagnosis not present

## 2012-03-26 DIAGNOSIS — M25569 Pain in unspecified knee: Secondary | ICD-10-CM | POA: Diagnosis not present

## 2012-03-26 DIAGNOSIS — M5137 Other intervertebral disc degeneration, lumbosacral region: Secondary | ICD-10-CM | POA: Diagnosis not present

## 2012-03-26 DIAGNOSIS — M5030 Other cervical disc degeneration, unspecified cervical region: Secondary | ICD-10-CM | POA: Diagnosis not present

## 2012-03-27 DIAGNOSIS — G5600 Carpal tunnel syndrome, unspecified upper limb: Secondary | ICD-10-CM | POA: Diagnosis not present

## 2012-03-28 ENCOUNTER — Other Ambulatory Visit: Payer: Self-pay | Admitting: Family Medicine

## 2012-04-04 DIAGNOSIS — M1990 Unspecified osteoarthritis, unspecified site: Secondary | ICD-10-CM | POA: Diagnosis not present

## 2012-04-04 DIAGNOSIS — M5137 Other intervertebral disc degeneration, lumbosacral region: Secondary | ICD-10-CM | POA: Diagnosis not present

## 2012-04-04 DIAGNOSIS — M5380 Other specified dorsopathies, site unspecified: Secondary | ICD-10-CM | POA: Diagnosis not present

## 2012-04-04 DIAGNOSIS — M48061 Spinal stenosis, lumbar region without neurogenic claudication: Secondary | ICD-10-CM | POA: Diagnosis not present

## 2012-04-17 ENCOUNTER — Other Ambulatory Visit: Payer: Self-pay | Admitting: Family Medicine

## 2012-04-26 ENCOUNTER — Other Ambulatory Visit: Payer: Self-pay

## 2012-04-26 DIAGNOSIS — E559 Vitamin D deficiency, unspecified: Secondary | ICD-10-CM

## 2012-04-26 MED ORDER — ERGOCALCIFEROL 1.25 MG (50000 UT) PO CAPS: 50000.0000 [IU] | ORAL_CAPSULE | ORAL | Status: DC

## 2012-04-27 DIAGNOSIS — Z96649 Presence of unspecified artificial hip joint: Secondary | ICD-10-CM | POA: Diagnosis not present

## 2012-04-27 DIAGNOSIS — G5600 Carpal tunnel syndrome, unspecified upper limb: Secondary | ICD-10-CM | POA: Diagnosis not present

## 2012-04-27 DIAGNOSIS — F411 Generalized anxiety disorder: Secondary | ICD-10-CM | POA: Diagnosis not present

## 2012-04-27 DIAGNOSIS — Z79899 Other long term (current) drug therapy: Secondary | ICD-10-CM | POA: Diagnosis not present

## 2012-04-27 DIAGNOSIS — I10 Essential (primary) hypertension: Secondary | ICD-10-CM | POA: Diagnosis not present

## 2012-04-27 DIAGNOSIS — M129 Arthropathy, unspecified: Secondary | ICD-10-CM | POA: Diagnosis not present

## 2012-04-27 DIAGNOSIS — Z981 Arthrodesis status: Secondary | ICD-10-CM | POA: Diagnosis not present

## 2012-04-27 DIAGNOSIS — Z8249 Family history of ischemic heart disease and other diseases of the circulatory system: Secondary | ICD-10-CM | POA: Diagnosis not present

## 2012-04-30 ENCOUNTER — Telehealth: Payer: Self-pay | Admitting: Family Medicine

## 2012-04-30 DIAGNOSIS — M25519 Pain in unspecified shoulder: Secondary | ICD-10-CM | POA: Diagnosis not present

## 2012-04-30 DIAGNOSIS — M25529 Pain in unspecified elbow: Secondary | ICD-10-CM | POA: Diagnosis not present

## 2012-04-30 DIAGNOSIS — M25569 Pain in unspecified knee: Secondary | ICD-10-CM | POA: Diagnosis not present

## 2012-04-30 DIAGNOSIS — M25579 Pain in unspecified ankle and joints of unspecified foot: Secondary | ICD-10-CM | POA: Diagnosis not present

## 2012-04-30 NOTE — Telephone Encounter (Signed)
Fax number confirmed and information faxed.

## 2012-05-15 ENCOUNTER — Other Ambulatory Visit: Payer: Self-pay | Admitting: Family Medicine

## 2012-05-16 ENCOUNTER — Telehealth: Payer: Self-pay | Admitting: Family Medicine

## 2012-05-17 ENCOUNTER — Other Ambulatory Visit: Payer: Self-pay | Admitting: Family Medicine

## 2012-05-17 MED ORDER — ALPRAZOLAM 0.5 MG PO TABS: ORAL_TABLET | ORAL | Status: AC

## 2012-05-17 NOTE — Telephone Encounter (Signed)
Patient aware and has appt in May

## 2012-05-17 NOTE — Telephone Encounter (Signed)
i have increased to twice daily for 2 months only script is printed, pls fax after you spk with her. She will need an appointment end May or in June also pls let her know. Also I see message re pain clinic, in case she asks , will need to take meds she is getting from current pain clinic.

## 2012-05-21 DIAGNOSIS — M2550 Pain in unspecified joint: Secondary | ICD-10-CM | POA: Diagnosis not present

## 2012-05-21 DIAGNOSIS — R5381 Other malaise: Secondary | ICD-10-CM | POA: Diagnosis not present

## 2012-05-21 DIAGNOSIS — E559 Vitamin D deficiency, unspecified: Secondary | ICD-10-CM | POA: Diagnosis not present

## 2012-05-21 DIAGNOSIS — E539 Vitamin B deficiency, unspecified: Secondary | ICD-10-CM | POA: Diagnosis not present

## 2012-05-21 DIAGNOSIS — Z79899 Other long term (current) drug therapy: Secondary | ICD-10-CM | POA: Diagnosis not present

## 2012-05-22 ENCOUNTER — Other Ambulatory Visit: Payer: Self-pay | Admitting: Family Medicine

## 2012-05-23 ENCOUNTER — Telehealth: Payer: Self-pay | Admitting: Family Medicine

## 2012-05-23 MED ORDER — TEMAZEPAM 30 MG PO CAPS: ORAL_CAPSULE | ORAL | Status: DC

## 2012-05-23 NOTE — Telephone Encounter (Signed)
Med faxed in and called to let them know at [facility] to fill it this am so pt can collect.  Pt was upset and crying and said she was having problems at home and I offered her an earlier appt but she said she would just wait and come in on her appt

## 2012-06-12 ENCOUNTER — Ambulatory Visit (INDEPENDENT_AMBULATORY_CARE_PROVIDER_SITE_OTHER): Payer: Medicare Other | Admitting: Family Medicine

## 2012-06-12 ENCOUNTER — Encounter: Payer: Self-pay | Admitting: Family Medicine

## 2012-06-12 VITALS — BP 130/80 | HR 80 | Resp 16 | Ht 65.0 in | Wt 142.1 lb

## 2012-06-12 DIAGNOSIS — F329 Major depressive disorder, single episode, unspecified: Secondary | ICD-10-CM | POA: Diagnosis not present

## 2012-06-12 DIAGNOSIS — A6000 Herpesviral infection of urogenital system, unspecified: Secondary | ICD-10-CM

## 2012-06-12 DIAGNOSIS — M25551 Pain in right hip: Secondary | ICD-10-CM

## 2012-06-12 DIAGNOSIS — I10 Essential (primary) hypertension: Secondary | ICD-10-CM

## 2012-06-12 DIAGNOSIS — F3289 Other specified depressive episodes: Secondary | ICD-10-CM

## 2012-06-12 DIAGNOSIS — M549 Dorsalgia, unspecified: Secondary | ICD-10-CM

## 2012-06-12 DIAGNOSIS — M25559 Pain in unspecified hip: Secondary | ICD-10-CM

## 2012-06-12 DIAGNOSIS — G4700 Insomnia, unspecified: Secondary | ICD-10-CM

## 2012-06-12 MED ORDER — EPINEPHRINE 0.3 MG/0.3ML IJ SOAJ: 0.3000 mg | Freq: Once | INTRAMUSCULAR | Status: DC

## 2012-06-12 NOTE — Progress Notes (Signed)
  Subjective:    Patient ID: Sylwia Cuervo, female    DOB: Jun 15, 1945, 67 y.o.   MRN: [id number]  HPI The PT is here for follow up and re-evaluation of chronic medical conditions, medication management and review of any available recent lab and radiology data.  Preventive health is updated, specifically  Cancer screening and Immunization.   Questions or concerns regarding consultations or procedures which the PT has had in the interim are  Addressed.Recently had labs though her rheumatologist that suggests possible autoimmune disease, has upcoming appt to review the report and its implication. I advised I would defer interpretation of results to ordering MD The PT denies any adverse reactions to current medications since the last visit.States does not feel any of her medication is helping her pain, I am advising tapering off flexeril since reports of no help. Life is centered around disabling pain , unfortunately still, she is followed by a pain specialist  Depression is an ongoing problem and she is still resistant to therapy which I now she will benefit from. Not suicidal or homicidal, reports good famiyl support       Review of Systems See HPI Denies recent fever or chills. Denies sinus pressure, nasal congestion, ear pain or sore throat. Denies chest congestion, productive cough or wheezing. Denies chest pains, palpitations and leg swelling Denies abdominal pain, nausea, vomiting,diarrhea or constipation.   Denies dysuria, frequency, hesitancy or incontinence.  Denies headaches, seizures,   Denies skin break down or rash.        Objective:   Physical Exam  Patient alert and oriented and in no cardiopulmonary distress.Tearful and in pain  HEENT: No facial asymmetry, EOMI, no sinus tenderness,  oropharynx pink and moist.  Neck adequate ROM no adenopathy.  Chest: Clear to auscultation bilaterally.  CVS: S1, S2 no murmurs, no S3.  ABD: Soft non tender. Bowel sounds  normal.  Ext: No edema  MS: Decreased ROM lumbar spine, and right  Hip, adequate in shoulders  and knees.  Skin: Intact, no ulcerations or rash noted.  Psych: Good eye contact,t. Memory intact anxious and  depressed appearing.Easily tearful  CNS: CN 2-12 intact, power,  normal throughout.       Assessment & Plan:

## 2012-06-12 NOTE — Patient Instructions (Addendum)
F/u mid June, please call if you need me before  Please try and cut back on flexeril to one twice daily, then 1 daily then stop in the next 2 to 3 weeks, see if you can do without the medication, if not helping ,no need to keep taking it.   Epi pen sent in, also letter sent to your rheumatologist

## 2012-06-13 ENCOUNTER — Other Ambulatory Visit: Payer: Self-pay | Admitting: Family Medicine

## 2012-06-13 DIAGNOSIS — Z79899 Other long term (current) drug therapy: Secondary | ICD-10-CM | POA: Diagnosis not present

## 2012-06-13 DIAGNOSIS — M5137 Other intervertebral disc degeneration, lumbosacral region: Secondary | ICD-10-CM | POA: Diagnosis not present

## 2012-06-13 DIAGNOSIS — M5380 Other specified dorsopathies, site unspecified: Secondary | ICD-10-CM | POA: Diagnosis not present

## 2012-06-13 DIAGNOSIS — M48061 Spinal stenosis, lumbar region without neurogenic claudication: Secondary | ICD-10-CM | POA: Diagnosis not present

## 2012-06-13 DIAGNOSIS — M1990 Unspecified osteoarthritis, unspecified site: Secondary | ICD-10-CM | POA: Diagnosis not present

## 2012-07-04 DIAGNOSIS — M5137 Other intervertebral disc degeneration, lumbosacral region: Secondary | ICD-10-CM | POA: Diagnosis not present

## 2012-07-04 DIAGNOSIS — M5030 Other cervical disc degeneration, unspecified cervical region: Secondary | ICD-10-CM | POA: Diagnosis not present

## 2012-07-04 DIAGNOSIS — M329 Systemic lupus erythematosus, unspecified: Secondary | ICD-10-CM | POA: Diagnosis not present

## 2012-07-04 DIAGNOSIS — IMO0001 Reserved for inherently not codable concepts without codable children: Secondary | ICD-10-CM | POA: Diagnosis not present

## 2012-07-06 DIAGNOSIS — M2550 Pain in unspecified joint: Secondary | ICD-10-CM | POA: Diagnosis not present

## 2012-07-08 NOTE — Assessment & Plan Note (Signed)
Uncontrolled, disabling, continue with management through pain clinic.Encouraged pt to increase toning, stretching activities and be as active as able

## 2012-07-08 NOTE — Assessment & Plan Note (Signed)
Sleep hygiene reviewed, continue management

## 2012-07-08 NOTE — Assessment & Plan Note (Signed)
Ongoing disabling and depressing pain

## 2012-07-08 NOTE — Assessment & Plan Note (Signed)
Controlled, no change in medication  

## 2012-07-08 NOTE — Assessment & Plan Note (Signed)
No current flare, uses zovirax as needed

## 2012-07-08 NOTE — Assessment & Plan Note (Signed)
Would benefit form therapy for chronic illness, however continues to be resistant  Continue currrent med, not suicidal or homicidal

## 2012-07-10 ENCOUNTER — Telehealth: Payer: Self-pay | Admitting: Family Medicine

## 2012-07-10 NOTE — Telephone Encounter (Signed)
shes coming tomorrow at 10:30 and she had a pneumovac in 2013

## 2012-07-10 NOTE — Telephone Encounter (Signed)
Please contact pt and let her know I heard from the rheumatologist, she needs an EKG due to h/o irregular heartbeat, and she also recommends a pneumnia vaccine if she has not  Already had one, this can be arranged as a nurse visit only, i will review the EKG

## 2012-07-11 ENCOUNTER — Ambulatory Visit: Payer: Medicare Other

## 2012-07-11 DIAGNOSIS — I499 Cardiac arrhythmia, unspecified: Secondary | ICD-10-CM

## 2012-07-11 DIAGNOSIS — Z139 Encounter for screening, unspecified: Secondary | ICD-10-CM

## 2012-07-11 NOTE — Progress Notes (Signed)
Patient in for EKG.  EKG given to MD for review.  EKG read bradycardia otherwise normal.

## 2012-07-18 ENCOUNTER — Other Ambulatory Visit: Payer: Self-pay | Admitting: Family Medicine

## 2012-07-18 DIAGNOSIS — M5137 Other intervertebral disc degeneration, lumbosacral region: Secondary | ICD-10-CM | POA: Diagnosis not present

## 2012-07-18 DIAGNOSIS — M1990 Unspecified osteoarthritis, unspecified site: Secondary | ICD-10-CM | POA: Diagnosis not present

## 2012-07-18 DIAGNOSIS — M48061 Spinal stenosis, lumbar region without neurogenic claudication: Secondary | ICD-10-CM | POA: Diagnosis not present

## 2012-07-18 DIAGNOSIS — M5380 Other specified dorsopathies, site unspecified: Secondary | ICD-10-CM | POA: Diagnosis not present

## 2012-08-06 ENCOUNTER — Encounter: Payer: Self-pay | Admitting: Family Medicine

## 2012-08-07 DIAGNOSIS — M329 Systemic lupus erythematosus, unspecified: Secondary | ICD-10-CM | POA: Diagnosis not present

## 2012-08-07 DIAGNOSIS — M5030 Other cervical disc degeneration, unspecified cervical region: Secondary | ICD-10-CM | POA: Diagnosis not present

## 2012-08-07 DIAGNOSIS — M5137 Other intervertebral disc degeneration, lumbosacral region: Secondary | ICD-10-CM | POA: Diagnosis not present

## 2012-08-07 DIAGNOSIS — T451X5A Adverse effect of antineoplastic and immunosuppressive drugs, initial encounter: Secondary | ICD-10-CM | POA: Diagnosis not present

## 2012-08-08 DIAGNOSIS — Z79899 Other long term (current) drug therapy: Secondary | ICD-10-CM | POA: Diagnosis not present

## 2012-08-17 ENCOUNTER — Other Ambulatory Visit: Payer: Self-pay | Admitting: Family Medicine

## 2012-08-17 ENCOUNTER — Other Ambulatory Visit: Payer: Self-pay

## 2012-08-17 MED ORDER — CLONIDINE HCL 0.3 MG PO TABS: ORAL_TABLET | ORAL | Status: DC

## 2012-08-23 ENCOUNTER — Other Ambulatory Visit: Payer: Self-pay | Admitting: Family Medicine

## 2012-09-13 DIAGNOSIS — H25019 Cortical age-related cataract, unspecified eye: Secondary | ICD-10-CM | POA: Diagnosis not present

## 2012-09-13 DIAGNOSIS — M069 Rheumatoid arthritis, unspecified: Secondary | ICD-10-CM | POA: Diagnosis not present

## 2012-10-15 DIAGNOSIS — Z96649 Presence of unspecified artificial hip joint: Secondary | ICD-10-CM | POA: Diagnosis not present

## 2012-10-15 DIAGNOSIS — G5710 Meralgia paresthetica, unspecified lower limb: Secondary | ICD-10-CM | POA: Diagnosis not present

## 2012-10-18 ENCOUNTER — Other Ambulatory Visit: Payer: Self-pay | Admitting: Family Medicine

## 2012-10-23 ENCOUNTER — Other Ambulatory Visit: Payer: Self-pay

## 2012-10-23 ENCOUNTER — Other Ambulatory Visit: Payer: Self-pay | Admitting: Family Medicine

## 2012-10-23 MED ORDER — CITALOPRAM HYDROBROMIDE 20 MG PO TABS: ORAL_TABLET | ORAL | Status: DC

## 2012-10-24 ENCOUNTER — Other Ambulatory Visit: Payer: Self-pay

## 2012-10-24 MED ORDER — TEMAZEPAM 30 MG PO CAPS: ORAL_CAPSULE | ORAL | Status: DC

## 2012-10-24 MED ORDER — ALPRAZOLAM 0.5 MG PO TABS: ORAL_TABLET | ORAL | Status: DC

## 2012-10-31 ENCOUNTER — Encounter: Payer: Self-pay | Admitting: Family Medicine

## 2012-10-31 ENCOUNTER — Ambulatory Visit (INDEPENDENT_AMBULATORY_CARE_PROVIDER_SITE_OTHER): Payer: Medicare Other | Admitting: Family Medicine

## 2012-10-31 VITALS — BP 132/78 | HR 62 | Resp 18 | Ht 65.0 in | Wt 134.1 lb

## 2012-10-31 DIAGNOSIS — K2270 Barrett's esophagus without dysplasia: Secondary | ICD-10-CM

## 2012-10-31 DIAGNOSIS — F411 Generalized anxiety disorder: Secondary | ICD-10-CM

## 2012-10-31 DIAGNOSIS — M329 Systemic lupus erythematosus, unspecified: Secondary | ICD-10-CM | POA: Insufficient documentation

## 2012-10-31 DIAGNOSIS — F329 Major depressive disorder, single episode, unspecified: Secondary | ICD-10-CM

## 2012-10-31 DIAGNOSIS — K219 Gastro-esophageal reflux disease without esophagitis: Secondary | ICD-10-CM

## 2012-10-31 DIAGNOSIS — Z23 Encounter for immunization: Secondary | ICD-10-CM | POA: Diagnosis not present

## 2012-10-31 DIAGNOSIS — I10 Essential (primary) hypertension: Secondary | ICD-10-CM | POA: Diagnosis not present

## 2012-10-31 DIAGNOSIS — F3289 Other specified depressive episodes: Secondary | ICD-10-CM

## 2012-10-31 DIAGNOSIS — M549 Dorsalgia, unspecified: Secondary | ICD-10-CM

## 2012-10-31 DIAGNOSIS — A6000 Herpesviral infection of urogenital system, unspecified: Secondary | ICD-10-CM

## 2012-10-31 MED ORDER — ALPRAZOLAM 0.5 MG PO TABS: ORAL_TABLET | ORAL | Status: DC

## 2012-10-31 MED ORDER — OMEPRAZOLE 40 MG PO CPDR: 40.0000 mg | DELAYED_RELEASE_CAPSULE | Freq: Every day | ORAL | Status: DC

## 2012-10-31 MED ORDER — BUSPIRONE HCL 5 MG PO TABS: 5.0000 mg | ORAL_TABLET | Freq: Three times a day (TID) | ORAL | Status: DC

## 2012-10-31 NOTE — Progress Notes (Signed)
  Subjective:    Patient ID: Renee Waters, female    DOB: 1945-03-11, 67 y.o.   MRN: [id number]  HPI The PT is here for follow up and re-evaluation of chronic medical conditions, medication management and review of any available recent lab and radiology data.  Preventive health is updated, specifically  Cancer screening and Immunization.   Questions or concerns regarding consultations or procedures which the PT has had in the interim are  Addressed.Is now on plaquinil for SLE and has had an eye exam also The PT denies any adverse reactions to current medications since the last visit.  Very distressed today due to just finding out that her daughter has been lying to her for a long time, intends to have her move to [place] , unable to cope with her anymore. Pin management continues to be a challenge but she sees pain specialist. Notes that sleep is not good , even with doubling xanax at times, we discussed the danger of this, will inc restoril dose and taper her off of xanax and start buspar for GAD. She has no interest in therapy at this time    Review of Systems See HPI Denies recent fever or chills. Denies sinus pressure, nasal congestion, ear pain or sore throat. Denies chest congestion, productive cough or wheezing. Denies chest pains, palpitations and leg swelling Denies abdominal pain, nausea, vomiting,diarrhea or constipation.   Denies dysuria, frequency, hesitancy or incontinence.  Denies headaches, seizures, numbness, or tingling.  Denies skin break down or rash.        Objective:   Physical Exam  Patient alert and oriented and in no cardiopulmonary distress.  HEENT: No facial asymmetry, EOMI, no sinus tenderness,  oropharynx pink and moist.  Neck supple no adenopathy.  Chest: Clear to auscultation bilaterally.  CVS: S1, S2 no murmurs, no S3.  ABD: Soft non tender. Bowel sounds normal.  Ext: No edema  MS: decreased ROM spine, shoulders, hips and knees.  Skin:  Intact, no ulcerations or rash noted.  Psych: Good eye contact, normal affect. Memory intact tearful and anxious, mildly  depressed appearing.  CNS: CN 2-12 intact, power, tone and sensation normal throughout.       Assessment & Plan:

## 2012-10-31 NOTE — Patient Instructions (Addendum)
F/u in December, call if you need me before  Flu vaccine today   New for reflux is omeprazole 40mg  one daily  New for anxiety is buspar one tablet every 8 hours , example 6am , 2pm and 10pm.  Reduce the xanax to ONE daily, as needed, only, for uncontrolled anxiety, starting today . Sixty xanax should last AT lEAST 2 month, I will let the pharmacy know also

## 2012-11-02 ENCOUNTER — Other Ambulatory Visit: Payer: Self-pay

## 2012-11-02 MED ORDER — TEMAZEPAM 30 MG PO CAPS: ORAL_CAPSULE | ORAL | Status: DC

## 2012-11-02 MED ORDER — CLONIDINE HCL 0.3 MG PO TABS: ORAL_TABLET | ORAL | Status: DC

## 2012-11-04 NOTE — Assessment & Plan Note (Signed)
Controlled, recent family turmoil as one of her children has been lying to her, dealing with this currently, no interest in therapy, not suicidal or homicidal No med change

## 2012-11-04 NOTE — Assessment & Plan Note (Signed)
Increased and uncontrolled, start buspar and reduce xanax with a plan to d/c

## 2012-11-04 NOTE — Assessment & Plan Note (Signed)
Denies any recent flares.

## 2012-11-04 NOTE — Assessment & Plan Note (Signed)
Tolerating  treatment and has had her eye exam

## 2012-11-04 NOTE — Assessment & Plan Note (Signed)
Uncontrolled , resume omeprazole, denies dysphagia

## 2012-11-04 NOTE — Assessment & Plan Note (Signed)
Controlled, no change in medication DASH diet and commitment to daily physical activity for a minimum of 30 minutes discussed and encouraged, as a part of hypertension management. The importance of attaining a healthy weight is also discussed.  

## 2012-11-04 NOTE — Assessment & Plan Note (Signed)
Managed through pain clinic 

## 2012-11-07 DIAGNOSIS — N390 Urinary tract infection, site not specified: Secondary | ICD-10-CM

## 2012-11-07 HISTORY — DX: Urinary tract infection, site not specified: N39.0

## 2012-11-08 DIAGNOSIS — M25559 Pain in unspecified hip: Secondary | ICD-10-CM | POA: Diagnosis not present

## 2012-11-08 DIAGNOSIS — T84099A Other mechanical complication of unspecified internal joint prosthesis, initial encounter: Secondary | ICD-10-CM | POA: Diagnosis not present

## 2012-11-08 DIAGNOSIS — Z96649 Presence of unspecified artificial hip joint: Secondary | ICD-10-CM | POA: Diagnosis not present

## 2012-11-14 DIAGNOSIS — M48061 Spinal stenosis, lumbar region without neurogenic claudication: Secondary | ICD-10-CM | POA: Diagnosis not present

## 2012-11-14 DIAGNOSIS — Z96649 Presence of unspecified artificial hip joint: Secondary | ICD-10-CM | POA: Diagnosis not present

## 2012-11-14 DIAGNOSIS — M5137 Other intervertebral disc degeneration, lumbosacral region: Secondary | ICD-10-CM | POA: Diagnosis not present

## 2012-11-14 DIAGNOSIS — M1990 Unspecified osteoarthritis, unspecified site: Secondary | ICD-10-CM | POA: Diagnosis not present

## 2012-11-26 ENCOUNTER — Encounter: Payer: Self-pay | Admitting: Family Medicine

## 2012-11-26 ENCOUNTER — Ambulatory Visit (INDEPENDENT_AMBULATORY_CARE_PROVIDER_SITE_OTHER): Payer: Medicare Other | Admitting: Family Medicine

## 2012-11-26 VITALS — BP 160/90 | HR 64 | Resp 18 | Ht 65.0 in | Wt 132.0 lb

## 2012-11-26 DIAGNOSIS — F3289 Other specified depressive episodes: Secondary | ICD-10-CM

## 2012-11-26 DIAGNOSIS — I10 Essential (primary) hypertension: Secondary | ICD-10-CM | POA: Diagnosis not present

## 2012-11-26 DIAGNOSIS — Z01818 Encounter for other preprocedural examination: Secondary | ICD-10-CM | POA: Diagnosis not present

## 2012-11-26 DIAGNOSIS — A6000 Herpesviral infection of urogenital system, unspecified: Secondary | ICD-10-CM

## 2012-11-26 DIAGNOSIS — K219 Gastro-esophageal reflux disease without esophagitis: Secondary | ICD-10-CM | POA: Diagnosis not present

## 2012-11-26 DIAGNOSIS — G4700 Insomnia, unspecified: Secondary | ICD-10-CM

## 2012-11-26 DIAGNOSIS — F329 Major depressive disorder, single episode, unspecified: Secondary | ICD-10-CM

## 2012-11-26 DIAGNOSIS — F411 Generalized anxiety disorder: Secondary | ICD-10-CM

## 2012-11-26 DIAGNOSIS — T84099A Other mechanical complication of unspecified internal joint prosthesis, initial encounter: Secondary | ICD-10-CM

## 2012-11-26 DIAGNOSIS — N3000 Acute cystitis without hematuria: Secondary | ICD-10-CM

## 2012-11-26 DIAGNOSIS — M1990 Unspecified osteoarthritis, unspecified site: Secondary | ICD-10-CM

## 2012-11-26 LAB — POCT URINALYSIS DIPSTICK
Bilirubin, UA: NEGATIVE
Glucose, UA: NEGATIVE
Nitrite, UA: NEGATIVE
Urobilinogen, UA: 1

## 2012-11-26 NOTE — Patient Instructions (Addendum)
F/u as before  Return this week Thursday at 1pm for BP re evaluation since you missed your morning dose of clonidiner today and yoour blood pressure is high, return BP when pt had taken medication for BP as scheduled is 120/80  cBC and chem 7 non fasting this week.  CXR this week  Urine is being sent for culture, strep viridans grew out , penicillin for 1 week prescribed

## 2012-11-26 NOTE — Progress Notes (Signed)
  Subjective:    Patient ID: Renee Waters, female    DOB: Nov 14, 1945, 67 y.o.   MRN: [id number]  HPI Pt in for medical clearance for hip surgery anticipated in November. States her main problem is uncontrolled hip pain, and taht relief of this will tremendously improve her quality of life. Her chronic medical conditions include hypertension ,  systemic lupus , generalized osteoarthritis, GERD, depression, anxiety and insomnia,h/o  HSV2 infection on chronic suppressive therapy. Unfortunately, on intial presentation, pt 'forgot" to take her BP medication , and had to return in 3 days for re evaluation of blood pressure only, which was then well controlled   Review of Systems See HPI Denies recent fever or chills. Denies sinus pressure, nasal congestion, ear pain or sore throat. Denies chest congestion, productive cough or wheezing. Denies chest pains, palpitations and leg swelling Denies abdominal pain, nausea, vomiting,diarrhea or constipation.   Denies dysuria, frequency, hesitancy or incontinence. Chronic  joint pain,  and limitation in mobility. Denies headaches or  seizures Denies uncontrolled  depression, anxiety or insomnia. Denies skin break down or rash.        Objective:   Physical Exam  Patient alert and oriented and in no cardiopulmonary distress.  HEENT: No facial asymmetry, EOMI, no sinus tenderness,  oropharynx pink and moist.  Neck adequate ROM, slightly decreased, no adenopathy.No JVD. TM clear bilaterally  Chest: Clear to auscultation bilaterally.  CVS: S1, S2 no murmurs, no S3. EKG: Sinus rhythm with occasional PVC's no evidence of ischemia  ABD: Soft non tender. Bowel sounds normal.No organomegaly, no masses  Ext: No edema  [id number]  ROM spine, shoulders, hips and knees.  Skin: Intact, no ulcerations or rash noted.  Psych: Good eye contact, normal affect. Memory intact not anxious or depressed appearing.  CNS: CN 2-12 intact, power, tone and  sensation normal throughout.       Assessment & Plan:

## 2012-11-29 ENCOUNTER — Other Ambulatory Visit: Payer: Self-pay | Admitting: Family Medicine

## 2012-11-29 DIAGNOSIS — Z01818 Encounter for other preprocedural examination: Secondary | ICD-10-CM | POA: Diagnosis not present

## 2012-11-29 LAB — URINE CULTURE: Colony Count: >=100000

## 2012-11-30 ENCOUNTER — Other Ambulatory Visit: Payer: Self-pay | Admitting: Family Medicine

## 2012-11-30 LAB — BASIC METABOLIC PANEL
BUN: 14 mg/dL (ref 6–23)
Chloride: 104 mEq/L (ref 96–112)
Potassium: 4.5 mEq/L (ref 3.5–5.3)
Sodium: 139 mEq/L (ref 135–145)

## 2012-11-30 LAB — CBC
MCHC: 34.1 g/dL (ref 30.0–36.0)
Platelets: 178 10*3/uL (ref 150–400)
RBC: 4.7 MIL/uL (ref 3.87–5.11)
RDW: 14.7 % (ref 11.5–15.5)
WBC: 6.9 10*3/uL (ref 4.0–10.5)

## 2012-11-30 MED ORDER — PENICILLIN V POTASSIUM 500 MG PO TABS: 500.0000 mg | ORAL_TABLET | Freq: Three times a day (TID) | ORAL | Status: AC

## 2012-12-01 DIAGNOSIS — N3000 Acute cystitis without hematuria: Secondary | ICD-10-CM | POA: Insufficient documentation

## 2012-12-01 NOTE — Assessment & Plan Note (Signed)
Pain management through pain clinic 

## 2012-12-01 NOTE — Assessment & Plan Note (Signed)
Planned surgery next month

## 2012-12-01 NOTE — Assessment & Plan Note (Signed)
Controlled, no change in medication  

## 2012-12-01 NOTE — Assessment & Plan Note (Signed)
No current flare, maintained on chronic suppression therapy

## 2012-12-01 NOTE — Assessment & Plan Note (Signed)
Medically cleared for upcoming surgery, based on history , exam and lab data. Renee Waters is reminded of the importance of taking all medication as prescribed, in particular her antihypertensive medication. Urinalysis shows infection , which will be treated following c/s, pt is  asymptomatic

## 2012-12-01 NOTE — Assessment & Plan Note (Signed)
Asymptomatic , however urinalysis is abnormal, will treat based on c/s result

## 2012-12-01 NOTE — Assessment & Plan Note (Signed)
Uncontrolled due to non compliance with medication, unintentionally. Return BP normal, continue medication as prescribed

## 2012-12-04 ENCOUNTER — Encounter (HOSPITAL_COMMUNITY): Payer: Self-pay | Admitting: Pharmacy Technician

## 2012-12-04 ENCOUNTER — Ambulatory Visit (HOSPITAL_COMMUNITY)
Admission: RE | Admit: 2012-12-04 | Discharge: 2012-12-04 | Disposition: A | Discharge status: Home / Self Care | Payer: Medicare Other | Source: Ambulatory Visit | Attending: Family Medicine | Admitting: Family Medicine

## 2012-12-04 DIAGNOSIS — Z01818 Encounter for other preprocedural examination: Secondary | ICD-10-CM | POA: Diagnosis not present

## 2012-12-05 ENCOUNTER — Encounter (HOSPITAL_COMMUNITY)
Admission: RE | Admit: 2012-12-05 | Discharge: 2012-12-05 | Disposition: A | Discharge status: Home / Self Care | Payer: Medicare Other | Source: Ambulatory Visit | Attending: Orthopedic Surgery | Admitting: Orthopedic Surgery

## 2012-12-05 ENCOUNTER — Encounter (HOSPITAL_COMMUNITY): Payer: Self-pay

## 2012-12-05 DIAGNOSIS — Z01812 Encounter for preprocedural laboratory examination: Secondary | ICD-10-CM | POA: Insufficient documentation

## 2012-12-05 HISTORY — DX: Personal history of other medical treatment: Z92.89

## 2012-12-05 HISTORY — DX: Urinary tract infection, site not specified: N39.0

## 2012-12-05 LAB — APTT: aPTT: 30 seconds (ref 24–37)

## 2012-12-05 LAB — URINALYSIS, ROUTINE W REFLEX MICROSCOPIC
Bilirubin Urine: NEGATIVE
Glucose, UA: NEGATIVE mg/dL
Hgb urine dipstick: NEGATIVE
Ketones, ur: NEGATIVE mg/dL
Nitrite: NEGATIVE
Protein, ur: 30 mg/dL — AB
Urobilinogen, UA: 0.2 mg/dL (ref 0.0–1.0)
pH: 6.5 (ref 5.0–8.0)

## 2012-12-05 LAB — PROTIME-INR: Prothrombin Time: 14.7 seconds (ref 11.6–15.2)

## 2012-12-05 LAB — URINE MICROSCOPIC-ADD ON

## 2012-12-05 NOTE — Patient Instructions (Signed)
20 Tasheka Houseman  12/05/2012   Your procedure is scheduled on:  12/17/12  MONDAY  Report to [facility] at    1015   AM.  Call this number if you have problems the morning of surgery: [phone]       Remember:   Do not eat food  Or drink :After Midnight.SUNDAY NIGHT   Take these medicines the morning of surgery with A SIP OF WATER:  Buspirone, Celexa, Clonidine, Omeprazole         May take DILAUDID or Gabapentin if needed REMOVE FENTANYL PATCH BEFORE COMING TO HOSPITAL  .  Contacts, dentures or partial plates can not be worn to surgery  Leave suitcase in the car. After surgery it may be brought to your room.  For patients admitted to the hospital, checkout time is 11:00 AM day of  discharge.             SPECIAL INSTRUCTIONS- SEE [place] PREPARING FOR SURGERY INSTRUCTION SHEET-     DO NOT WEAR JEWELRY, LOTIONS, POWDERS, OR PERFUMES.  WOMEN-- DO NOT SHAVE LEGS OR UNDERARMS FOR 12 HOURS BEFORE SHOWERS. MEN MAY SHAVE FACE.  Patients discharged the day of surgery will not be allowed to drive home. IF going home the day of surgery, you must have a driver and someone to stay with you for the first 24 hours  Name and phone number of your driver:                                                                        Please read over the following fact sheets that you were given: MRSA Information, Incentive Spirometry Sheet, Blood Transfusion Sheet  Information                                                                                 I UNDERSTAND I WILL HAVE BLOOD DRAWN MORNING OF SURGERY FOR TYPE AND SCREEN  Horice Carrero  PST [phone]  [phone]                 FAILURE TO FOLLOW THESE INSTRUCTIONS MAY RESULT IN  CANCELLATION   OF YOUR SURGERY                                                  Patient Signature _____________________________

## 2012-12-05 NOTE — Progress Notes (Signed)
CBC, BMET, urine culture 11/29/12 [facility], chest 10/14 [facility],  LOV note with EKG and clearance Dr Lodema Hong on chart.  Instructed patient to tell Bartholomew Crews at pre op she has been on RX for UTI- verbalized understanding

## 2012-12-05 NOTE — Progress Notes (Signed)
Faxed urine with micro to Dr Charlann Boxer through [facility] and spoke with Natalia Leatherwood at [facility] (Dr Boneta Lucks pod) informing her as pt has appt to see Bartholomew Crews at 4pm

## 2012-12-16 NOTE — H&P (Signed)
TOTAL HIP REVISION ADMISSION H&P  Patient is admitted for right revision total hip arthroplasty.  Subjective:  Chief Complaint: Right hip pain s/p right total hip arthroplasty.  HPI: Renee Waters, 67 y.o. female, with a history of a right total hip arthroplasty performed in 2011 by Dr. Dewaine Conger. She's had persistent discomfort involving the anterior and anterior lateral aspects of her hip. She underwent an iliotibial band recession-type procedure in 2012 by Dr. Chaney Malling with persistent anterior hip pain. She continues to have complaints of pain in the anterior aspect of the hip with certain activities. She has a difficult time with bearing weight. She also has persistent pain on the lateral side of the hip. She's had no problems with infection or wound healing, no fevers, chills or night sweats recently. Given the fact she has had the pain and her age, options were discussed with her --one being to continue to observe it conservatively without any surgical intervention. The other would be to revise the acetabular component. At think at the very least, revising the acetabular component would improve the chances of polyethylene wear by decreasing the peak stresses at the apex of the poly. There would be concerned about the potential for impingement due to the orientation of the cup and the fact that it could contribute to her pain.There is a very good chance that, by reorienting her cup, she would see significant improvement. She does not have any acetabular screws placed. It is possible the cup could have changed orientation with time. It is possible the cup could be loose. Plan is to revise the acetabular component.Dr. Charlann Boxer discussed this with her, the postoperative course and expectations, including the fact that she would have persistent lateral hip pain based on her size and scarring of the lateral iliotibial band. Risks of infection and DVT were reviewed. We discussed her post  op. course last time requiring a 10-day hospital stay and 6 units of blood transfusion. I do not anticipate that would be the issue with this revision surgery.  D/C Plans:   Home with HHPT  Post-op Meds:     No Rx given  Tranexamic Acid:     To be given  Decadron:    To be given  FYI:    Already on Dilaudid 4 mg q 4 hrs  Already on Duragesic patches   Patient Active Problem List   Diagnosis Date Noted  . Acute cystitis 12/01/2012  . Preoperative clearance 11/26/2012  . Systemic lupus 10/31/2012  . GAD (generalized anxiety disorder) 10/31/2012  . Vitamin D deficiency 03/16/2012  . Right hip pain 03/15/2012  . Generalized pain 03/15/2012  . Constipation 10/06/2011  . Radicular pain of right lower back 06/29/2011  . Other mechanical complication of prosthetic joint implant 06/29/2011  . Hip pain 06/29/2011  . Barrett's esophagus 12/13/2010  . LUQ pain 12/13/2010  . ALLERGY UNSPECIFIED NOT ELSEWHERE CLASSIFIED 01/18/2009  . FATIGUE 08/08/2008  . GENITAL HERPES 06/22/2007  . DEPRESSION 06/22/2007  . HYPERTENSION 06/22/2007  . GERD 06/22/2007  . OSTEOARTHRITIS, SEVERE 06/22/2007  . NECK PAIN, CHRONIC 06/22/2007  . BACK PAIN, CHRONIC 06/22/2007  . INSOMNIA 06/22/2007   Past Medical History  Diagnosis Date  . Insomnia   . Chronic back pain   . Chronic neck pain   . Depression   . GERD (gastroesophageal reflux disease)   . Osteoarthritis   . Genital herpes   . Hypertension   . S/P colonoscopy June 2005    normal, no polyps  .  S/P endoscopy June 2005, Oct 2009    2005: short-segment Barrett's, 2009: short-segment Barrett's  . Barrett's esophagus   . UTI (lower urinary tract infection) 10/14    currently on Penicillin-  states is improving  . History of blood transfusion     Past Surgical History  Procedure Laterality Date  . Cervical disectomy  2002  . Shoulder arthroscopy  2008    left  . Right hip replacement  07/2010    went back in sept 2012 to fix  .  Hernia repair  07/2010    Dr. Arlean Hopping  . Abdominal hysterectomy    . Esophagogastroduodenoscopy  11/29/2007    salmon-colored  tongue   longest stable at  3 cm, distal esophagus as described previously status post biopsy/ Hiatal hernia, otherwise normal stomach D1 and D2  . Esophagogastroduodenoscopy  01/06/11    short segment Barrett's esophagus s/p bx/Hiatal hernia  . Colonoscopy  11/09/2011    Procedure: COLONOSCOPY;  Surgeon: Corbin Ade, MD;  Location: [facility];  Service: Endoscopy;  Laterality: N/A;  1:45    No prescriptions prior to admission   Allergies  Allergen Reactions  . Tylox [Oxycodone-Acetaminophen] Other (See Comments)    Severe gerd  . Bee Venom Swelling    History  Substance Use Topics  . Smoking status: Former Smoker    Types: Cigarettes  . Smokeless tobacco: Never Used     Comment: quit in 2004  . Alcohol Use: Yes     Comment: socially, rare    Family History  Problem Relation Age of Onset  . Hypertension Mother   . Colon cancer Neg Hx   . Anesthesia problems Neg Hx   . Hypotension Neg Hx   . Malignant hyperthermia Neg Hx   . Pseudochol deficiency Neg Hx       Review of Systems  Constitutional: Positive for malaise/fatigue.  Eyes: Negative.   Respiratory: Negative.   Cardiovascular: Negative.   Gastrointestinal: Positive for heartburn and constipation.  Genitourinary: Negative.   Musculoskeletal: Positive for back pain, joint pain and neck pain.  Skin: Negative.   Neurological: Negative.   Endo/Heme/Allergies: Positive for environmental allergies.  Psychiatric/Behavioral: Positive for depression. The patient is nervous/anxious.     Objective:  Physical Exam  Constitutional: She is oriented to person, place, and time. She appears well-developed and well-nourished.  HENT:  Head: Normocephalic and atraumatic.  Mouth/Throat: Oropharynx is clear and moist.  Eyes: Pupils are equal, round, and reactive to light.  Neck: Neck supple. No  JVD present. No tracheal deviation present. No thyromegaly present.  Cardiovascular: Normal rate, regular rhythm, normal heart sounds and intact distal pulses.   Respiratory: Effort normal and breath sounds normal. No stridor. No respiratory distress. She has no wheezes.  GI: Soft. There is no tenderness. There is no guarding.  Musculoskeletal:       Right hip: She exhibits decreased range of motion, decreased strength, tenderness, bony tenderness and laceration (healed). She exhibits no swelling, no crepitus and no deformity.  Lymphadenopathy:    She has no cervical adenopathy.  Neurological: She is alert and oriented to person, place, and time.  Skin: Skin is warm and dry.  Psychiatric: She has a normal mood and affect.    Labs:  Estimated body mass index is 22.32 kg/(m^2) as calculated from the following:   Height as of 10/31/12: 5\' 5"  (1.651 m).   Weight as of 10/31/12: 60.836 kg (134 lb 1.9 oz).  Imaging Review:  Plain  radiographs demonstrate previous total hip arthroplasty with a vertically oriented acetabular component of the right hip(s). There is evidence of loosening of the acetabular cup.The bone quality appears to be good for age and reported activity level. Ther  Assessment/Plan:  Articular bearing surface wear, right hip(s) with failed previous arthroplasty.  The patient history, physical examination, clinical judgement of the provider and imaging studies are consistent with end stage degenerative joint disease of the right hip(s), previous total hip arthroplasty. Revision total hip arthroplasty is deemed medically necessary. The treatment options including medical management, injection therapy, arthroscopy and arthroplasty were discussed at length. The risks and benefits of total hip arthroplasty were presented and reviewed. The risks due to aseptic loosening, infection, stiffness, dislocation/subluxation,  thromboembolic complications and other imponderables were discussed.   The patient acknowledged the explanation, agreed to proceed with the plan and consent was signed. Patient is being admitted for inpatient treatment for surgery, pain control, PT, OT, prophylactic antibiotics, VTE prophylaxis, progressive ambulation and ADL's and discharge planning. The patient is planning to be discharged home with home health services.     Anastasio Auerbach Keiry Kowal   PAC  12/16/2012, 3:53 PM

## 2012-12-17 ENCOUNTER — Encounter (HOSPITAL_COMMUNITY): Payer: Self-pay | Admitting: *Deleted

## 2012-12-17 ENCOUNTER — Inpatient Hospital Stay (HOSPITAL_COMMUNITY): Payer: Medicare Other | Admitting: Anesthesiology

## 2012-12-17 ENCOUNTER — Inpatient Hospital Stay (HOSPITAL_COMMUNITY)
Admission: RE | Admit: 2012-12-17 | Discharge: 2012-12-19 | DRG: 467 | Disposition: A | Discharge status: Home Health | Payer: Medicare Other | Source: Ambulatory Visit | Attending: Orthopedic Surgery | Admitting: Orthopedic Surgery

## 2012-12-17 ENCOUNTER — Inpatient Hospital Stay (HOSPITAL_COMMUNITY): Payer: Medicare Other

## 2012-12-17 ENCOUNTER — Encounter (HOSPITAL_COMMUNITY): Payer: Medicare Other | Admitting: Anesthesiology

## 2012-12-17 ENCOUNTER — Encounter (HOSPITAL_COMMUNITY)
Admission: RE | Disposition: A | Discharge status: Home Health | Payer: Self-pay | Source: Ambulatory Visit | Attending: Orthopedic Surgery

## 2012-12-17 DIAGNOSIS — M549 Dorsalgia, unspecified: Secondary | ICD-10-CM | POA: Diagnosis present

## 2012-12-17 DIAGNOSIS — F411 Generalized anxiety disorder: Secondary | ICD-10-CM | POA: Diagnosis present

## 2012-12-17 DIAGNOSIS — M329 Systemic lupus erythematosus, unspecified: Secondary | ICD-10-CM | POA: Diagnosis present

## 2012-12-17 DIAGNOSIS — T84099A Other mechanical complication of unspecified internal joint prosthesis, initial encounter: Principal | ICD-10-CM | POA: Diagnosis present

## 2012-12-17 DIAGNOSIS — M542 Cervicalgia: Secondary | ICD-10-CM | POA: Diagnosis present

## 2012-12-17 DIAGNOSIS — Z8249 Family history of ischemic heart disease and other diseases of the circulatory system: Secondary | ICD-10-CM

## 2012-12-17 DIAGNOSIS — Z01812 Encounter for preprocedural laboratory examination: Secondary | ICD-10-CM

## 2012-12-17 DIAGNOSIS — Z96649 Presence of unspecified artificial hip joint: Secondary | ICD-10-CM

## 2012-12-17 DIAGNOSIS — Z87891 Personal history of nicotine dependence: Secondary | ICD-10-CM | POA: Diagnosis not present

## 2012-12-17 DIAGNOSIS — Z87440 Personal history of urinary (tract) infections: Secondary | ICD-10-CM | POA: Diagnosis not present

## 2012-12-17 DIAGNOSIS — M25559 Pain in unspecified hip: Secondary | ICD-10-CM | POA: Diagnosis not present

## 2012-12-17 DIAGNOSIS — M169 Osteoarthritis of hip, unspecified: Secondary | ICD-10-CM | POA: Diagnosis present

## 2012-12-17 DIAGNOSIS — K219 Gastro-esophageal reflux disease without esophagitis: Secondary | ICD-10-CM | POA: Diagnosis not present

## 2012-12-17 DIAGNOSIS — Y831 Surgical operation with implant of artificial internal device as the cause of abnormal reaction of the patient, or of later complication, without mention of misadventure at the time of the procedure: Secondary | ICD-10-CM | POA: Diagnosis present

## 2012-12-17 DIAGNOSIS — T84498A Other mechanical complication of other internal orthopedic devices, implants and grafts, initial encounter: Secondary | ICD-10-CM | POA: Diagnosis not present

## 2012-12-17 DIAGNOSIS — D500 Iron deficiency anemia secondary to blood loss (chronic): Secondary | ICD-10-CM | POA: Diagnosis not present

## 2012-12-17 DIAGNOSIS — M2480 Other specific joint derangements of unspecified joint, not elsewhere classified: Secondary | ICD-10-CM | POA: Diagnosis not present

## 2012-12-17 DIAGNOSIS — I10 Essential (primary) hypertension: Secondary | ICD-10-CM | POA: Diagnosis present

## 2012-12-17 DIAGNOSIS — D62 Acute posthemorrhagic anemia: Secondary | ICD-10-CM | POA: Diagnosis not present

## 2012-12-17 DIAGNOSIS — M1610 Unilateral primary osteoarthritis, unspecified hip: Secondary | ICD-10-CM | POA: Diagnosis present

## 2012-12-17 DIAGNOSIS — G8929 Other chronic pain: Secondary | ICD-10-CM | POA: Diagnosis present

## 2012-12-17 DIAGNOSIS — K2270 Barrett's esophagus without dysplasia: Secondary | ICD-10-CM | POA: Diagnosis not present

## 2012-12-17 DIAGNOSIS — Z471 Aftercare following joint replacement surgery: Secondary | ICD-10-CM | POA: Diagnosis not present

## 2012-12-17 HISTORY — PX: TOTAL HIP REVISION: SHX763

## 2012-12-17 LAB — TYPE AND SCREEN
ABO/RH(D): O POS
Antibody Screen: NEGATIVE

## 2012-12-17 SURGERY — TOTAL HIP REVISION
Anesthesia: General | Site: Hip | Laterality: Right | Wound class: Clean

## 2012-12-17 MED ORDER — ONDANSETRON HCL 4 MG/2ML IJ SOLN
INTRAMUSCULAR | Status: DC | PRN
  Administered 2012-12-17: 4 mg via INTRAVENOUS

## 2012-12-17 MED ORDER — LACTATED RINGERS IV SOLN
INTRAVENOUS | Status: DC
  Administered 2012-12-17: 14:00:00 via INTRAVENOUS
  Administered 2012-12-17: 1000 mL via INTRAVENOUS
  Administered 2012-12-17: 12:00:00 via INTRAVENOUS

## 2012-12-17 MED ORDER — ONDANSETRON HCL 4 MG PO TABS: 4.0000 mg | ORAL_TABLET | Freq: Four times a day (QID) | ORAL | Status: DC | PRN

## 2012-12-17 MED ORDER — CEFAZOLIN SODIUM-DEXTROSE 2-3 GM-% IV SOLR
2.0000 g | INTRAVENOUS | Status: AC
  Administered 2012-12-17: 2 g via INTRAVENOUS

## 2012-12-17 MED ORDER — SODIUM CHLORIDE 0.9 % IV SOLN
100.0000 mL/h | INTRAVENOUS | Status: DC
  Administered 2012-12-17: 100 mL/h via INTRAVENOUS
  Filled 2012-12-17 (×6): qty 1000

## 2012-12-17 MED ORDER — MEPERIDINE HCL 50 MG/ML IJ SOLN
INTRAMUSCULAR | Status: AC
  Filled 2012-12-17: qty 1

## 2012-12-17 MED ORDER — CEFAZOLIN SODIUM-DEXTROSE 2-3 GM-% IV SOLR
INTRAVENOUS | Status: AC
  Filled 2012-12-17: qty 50

## 2012-12-17 MED ORDER — HYDROMORPHONE HCL PF 1 MG/ML IJ SOLN
0.5000 mg | INTRAMUSCULAR | Status: DC | PRN
  Administered 2012-12-17 – 2012-12-18 (×3): 2 mg via INTRAVENOUS
  Filled 2012-12-17: qty 1
  Filled 2012-12-17 (×4): qty 2

## 2012-12-17 MED ORDER — MENTHOL 3 MG MT LOZG: 1.0000 | LOZENGE | OROMUCOSAL | Status: DC | PRN

## 2012-12-17 MED ORDER — DOCUSATE SODIUM 100 MG PO CAPS
100.0000 mg | ORAL_CAPSULE | Freq: Two times a day (BID) | ORAL | Status: DC
  Administered 2012-12-17 – 2012-12-18 (×3): 100 mg via ORAL

## 2012-12-17 MED ORDER — ROCURONIUM BROMIDE 100 MG/10ML IV SOLN
INTRAVENOUS | Status: DC | PRN
  Administered 2012-12-17: 40 mg via INTRAVENOUS

## 2012-12-17 MED ORDER — BUSPIRONE HCL 5 MG PO TABS
5.0000 mg | ORAL_TABLET | Freq: Three times a day (TID) | ORAL | Status: DC
Start: 2012-12-17 — End: 2012-12-19
  Administered 2012-12-17 – 2012-12-19 (×4): 5 mg via ORAL
  Filled 2012-12-17 (×8): qty 1

## 2012-12-17 MED ORDER — 0.9 % SODIUM CHLORIDE (POUR BTL) OPTIME
TOPICAL | Status: DC | PRN
  Administered 2012-12-17: 1000 mL

## 2012-12-17 MED ORDER — MIDAZOLAM HCL 2 MG/2ML IJ SOLN
INTRAMUSCULAR | Status: AC
  Filled 2012-12-17: qty 2

## 2012-12-17 MED ORDER — STERILE WATER FOR IRRIGATION IR SOLN
Status: DC | PRN
  Administered 2012-12-17: 1500 mL

## 2012-12-17 MED ORDER — PHENYLEPHRINE HCL 10 MG/ML IJ SOLN
INTRAMUSCULAR | Status: DC | PRN
  Administered 2012-12-17: 40 ug via INTRAVENOUS

## 2012-12-17 MED ORDER — METOCLOPRAMIDE HCL 10 MG PO TABS: 5.0000 mg | ORAL_TABLET | Freq: Three times a day (TID) | ORAL | Status: DC | PRN

## 2012-12-17 MED ORDER — HYDRALAZINE HCL 20 MG/ML IJ SOLN
INTRAMUSCULAR | Status: DC | PRN
  Administered 2012-12-17: 10 mg via INTRAVENOUS

## 2012-12-17 MED ORDER — MEPERIDINE HCL 50 MG/ML IJ SOLN
6.2500 mg | INTRAMUSCULAR | Status: DC | PRN
  Administered 2012-12-17 (×2): 12.5 mg via INTRAVENOUS

## 2012-12-17 MED ORDER — HYDROMORPHONE HCL PF 1 MG/ML IJ SOLN
INTRAMUSCULAR | Status: AC
  Filled 2012-12-17: qty 1

## 2012-12-17 MED ORDER — GABAPENTIN 300 MG PO CAPS
300.0000 mg | ORAL_CAPSULE | Freq: Three times a day (TID) | ORAL | Status: DC | PRN
  Administered 2012-12-18: 300 mg via ORAL
  Filled 2012-12-17: qty 1

## 2012-12-17 MED ORDER — PANTOPRAZOLE SODIUM 40 MG PO TBEC
80.0000 mg | DELAYED_RELEASE_TABLET | Freq: Every day | ORAL | Status: DC
  Administered 2012-12-17 – 2012-12-19 (×3): 80 mg via ORAL
  Filled 2012-12-17 (×4): qty 2

## 2012-12-17 MED ORDER — MIDAZOLAM HCL 2 MG/2ML IJ SOLN
0.5000 mg | INTRAMUSCULAR | Status: DC | PRN
  Administered 2012-12-17 (×2): 0.5 mg via INTRAVENOUS

## 2012-12-17 MED ORDER — PHENOL 1.4 % MT LIQD: 1.0000 | OROMUCOSAL | Status: DC | PRN

## 2012-12-17 MED ORDER — DEXAMETHASONE SODIUM PHOSPHATE 10 MG/ML IJ SOLN
INTRAMUSCULAR | Status: DC | PRN
  Administered 2012-12-17: 10 mg via INTRAVENOUS

## 2012-12-17 MED ORDER — CITALOPRAM HYDROBROMIDE 20 MG PO TABS
20.0000 mg | ORAL_TABLET | Freq: Every day | ORAL | Status: DC
  Administered 2012-12-18 – 2012-12-19 (×2): 20 mg via ORAL
  Filled 2012-12-17 (×2): qty 1

## 2012-12-17 MED ORDER — EPINEPHRINE 0.3 MG/0.3ML IJ SOAJ
0.3000 mg | Freq: Once | INTRAMUSCULAR | Status: DC
  Filled 2012-12-17: qty 0.6

## 2012-12-17 MED ORDER — KETAMINE HCL 50 MG/ML IJ SOLN
INTRAMUSCULAR | Status: DC | PRN
  Administered 2012-12-17: 10 mg via INTRAMUSCULAR
  Administered 2012-12-17 (×2): 20 mg via INTRAMUSCULAR

## 2012-12-17 MED ORDER — HYDROMORPHONE HCL PF 1 MG/ML IJ SOLN: 0.2500 mg | INTRAMUSCULAR | Status: DC | PRN

## 2012-12-17 MED ORDER — PROPOFOL 10 MG/ML IV BOLUS
INTRAVENOUS | Status: DC | PRN
  Administered 2012-12-17: 160 mg via INTRAVENOUS

## 2012-12-17 MED ORDER — METHOCARBAMOL 500 MG PO TABS
500.0000 mg | ORAL_TABLET | Freq: Four times a day (QID) | ORAL | Status: DC | PRN
  Administered 2012-12-18 – 2012-12-19 (×4): 500 mg via ORAL
  Filled 2012-12-17 (×4): qty 1

## 2012-12-17 MED ORDER — DEXAMETHASONE SODIUM PHOSPHATE 10 MG/ML IJ SOLN: 10.0000 mg | Freq: Once | INTRAMUSCULAR | Status: DC

## 2012-12-17 MED ORDER — FLEET ENEMA 7-19 GM/118ML RE ENEM: 1.0000 | ENEMA | Freq: Once | RECTAL | Status: AC | PRN

## 2012-12-17 MED ORDER — ONDANSETRON HCL 4 MG/2ML IJ SOLN: 4.0000 mg | Freq: Four times a day (QID) | INTRAMUSCULAR | Status: DC | PRN

## 2012-12-17 MED ORDER — METOCLOPRAMIDE HCL 5 MG/ML IJ SOLN: 5.0000 mg | Freq: Three times a day (TID) | INTRAMUSCULAR | Status: DC | PRN

## 2012-12-17 MED ORDER — METHOCARBAMOL 100 MG/ML IJ SOLN
500.0000 mg | Freq: Four times a day (QID) | INTRAVENOUS | Status: DC | PRN
  Administered 2012-12-17: 500 mg via INTRAVENOUS
  Filled 2012-12-17: qty 5

## 2012-12-17 MED ORDER — DEXAMETHASONE SODIUM PHOSPHATE 10 MG/ML IJ SOLN
10.0000 mg | Freq: Once | INTRAMUSCULAR | Status: AC
  Administered 2012-12-18: 16:00:00 10 mg via INTRAVENOUS
  Filled 2012-12-17: qty 1

## 2012-12-17 MED ORDER — FENTANYL 50 MCG/HR TD PT72
50.0000 ug | MEDICATED_PATCH | TRANSDERMAL | Status: DC
  Administered 2012-12-17: 50 ug via TRANSDERMAL
  Filled 2012-12-17: qty 1

## 2012-12-17 MED ORDER — DIPHENHYDRAMINE HCL 25 MG PO CAPS: 25.0000 mg | ORAL_CAPSULE | Freq: Four times a day (QID) | ORAL | Status: DC | PRN

## 2012-12-17 MED ORDER — ALUM & MAG HYDROXIDE-SIMETH 200-200-20 MG/5ML PO SUSP: 30.0000 mL | ORAL | Status: DC | PRN

## 2012-12-17 MED ORDER — MIDAZOLAM HCL 5 MG/5ML IJ SOLN
INTRAMUSCULAR | Status: DC | PRN
  Administered 2012-12-17: 2 mg via INTRAVENOUS

## 2012-12-17 MED ORDER — GLYCOPYRROLATE 0.2 MG/ML IJ SOLN
INTRAMUSCULAR | Status: DC | PRN
  Administered 2012-12-17: .5 mg via INTRAVENOUS

## 2012-12-17 MED ORDER — TEMAZEPAM 15 MG PO CAPS: 30.0000 mg | ORAL_CAPSULE | Freq: Every evening | ORAL | Status: DC | PRN

## 2012-12-17 MED ORDER — LACTATED RINGERS IV SOLN: INTRAVENOUS | Status: DC

## 2012-12-17 MED ORDER — CLONIDINE HCL 0.3 MG PO TABS
0.3000 mg | ORAL_TABLET | Freq: Two times a day (BID) | ORAL | Status: DC
  Administered 2012-12-17 – 2012-12-19 (×4): 0.3 mg via ORAL
  Filled 2012-12-17 (×5): qty 1

## 2012-12-17 MED ORDER — NEOSTIGMINE METHYLSULFATE 1 MG/ML IJ SOLN
INTRAMUSCULAR | Status: DC | PRN
  Administered 2012-12-17: 3 mg via INTRAVENOUS

## 2012-12-17 MED ORDER — CEFAZOLIN SODIUM-DEXTROSE 2-3 GM-% IV SOLR
2.0000 g | Freq: Four times a day (QID) | INTRAVENOUS | Status: AC
  Administered 2012-12-17 – 2012-12-18 (×2): 2 g via INTRAVENOUS
  Filled 2012-12-17 (×2): qty 50

## 2012-12-17 MED ORDER — ASPIRIN EC 325 MG PO TBEC
325.0000 mg | DELAYED_RELEASE_TABLET | Freq: Two times a day (BID) | ORAL | Status: DC
  Administered 2012-12-18 – 2012-12-19 (×3): 325 mg via ORAL
  Filled 2012-12-17 (×5): qty 1

## 2012-12-17 MED ORDER — LIDOCAINE HCL (CARDIAC) 20 MG/ML IV SOLN
INTRAVENOUS | Status: DC | PRN
  Administered 2012-12-17: 70 mg via INTRAVENOUS

## 2012-12-17 MED ORDER — HYDROMORPHONE HCL PF 1 MG/ML IJ SOLN
0.2500 mg | INTRAMUSCULAR | Status: DC | PRN
  Administered 2012-12-17 (×4): 0.5 mg via INTRAVENOUS

## 2012-12-17 MED ORDER — CELECOXIB 200 MG PO CAPS
200.0000 mg | ORAL_CAPSULE | Freq: Two times a day (BID) | ORAL | Status: DC
  Administered 2012-12-17 – 2012-12-19 (×4): 200 mg via ORAL
  Filled 2012-12-17 (×5): qty 1

## 2012-12-17 MED ORDER — ACYCLOVIR 800 MG PO TABS
800.0000 mg | ORAL_TABLET | Freq: Four times a day (QID) | ORAL | Status: DC | PRN
  Filled 2012-12-17: qty 1

## 2012-12-17 MED ORDER — BISACODYL 10 MG RE SUPP: 10.0000 mg | Freq: Every day | RECTAL | Status: DC | PRN

## 2012-12-17 MED ORDER — FENTANYL CITRATE 0.05 MG/ML IJ SOLN
INTRAMUSCULAR | Status: DC | PRN
  Administered 2012-12-17 (×5): 50 ug via INTRAVENOUS

## 2012-12-17 MED ORDER — POLYETHYLENE GLYCOL 3350 17 G PO PACK
17.0000 g | PACK | Freq: Two times a day (BID) | ORAL | Status: DC
  Administered 2012-12-17 – 2012-12-18 (×3): 17 g via ORAL

## 2012-12-17 MED ORDER — HYDROMORPHONE HCL 2 MG PO TABS
4.0000 mg | ORAL_TABLET | ORAL | Status: DC
Start: 2012-12-17 — End: 2012-12-19
  Administered 2012-12-17 – 2012-12-19 (×9): 4 mg via ORAL
  Filled 2012-12-17 (×9): qty 2

## 2012-12-17 MED ORDER — HYDROMORPHONE HCL PF 1 MG/ML IJ SOLN
INTRAMUSCULAR | Status: DC | PRN
Start: 2012-12-17 — End: 2012-12-17
  Administered 2012-12-17: 0.5 mg via INTRAVENOUS
  Administered 2012-12-17 (×2): 1 mg via INTRAVENOUS
  Administered 2012-12-17: 0.5 mg via INTRAVENOUS

## 2012-12-17 MED ORDER — SODIUM CHLORIDE 0.9 % IV SOLN
1000.0000 mg | Freq: Once | INTRAVENOUS | Status: AC
  Administered 2012-12-17: 1000 mg via INTRAVENOUS
  Filled 2012-12-17: qty 10

## 2012-12-17 MED ORDER — ZOLPIDEM TARTRATE 5 MG PO TABS
5.0000 mg | ORAL_TABLET | Freq: Every evening | ORAL | Status: DC | PRN
  Administered 2012-12-17 – 2012-12-19 (×2): 5 mg via ORAL
  Filled 2012-12-17 (×2): qty 1

## 2012-12-17 MED ORDER — FERROUS SULFATE 325 (65 FE) MG PO TABS
325.0000 mg | ORAL_TABLET | Freq: Three times a day (TID) | ORAL | Status: DC
  Administered 2012-12-17 – 2012-12-19 (×4): 325 mg via ORAL
  Filled 2012-12-17 (×8): qty 1

## 2012-12-17 SURGICAL SUPPLY — 62 items
ADH SKN CLS APL DERMABOND .7 (GAUZE/BANDAGES/DRESSINGS) ×1
BAG SPEC THK2 15X12 ZIP CLS (MISCELLANEOUS) ×1
BAG ZIPLOCK 12X15 (MISCELLANEOUS) ×2 IMPLANT
BALL HIP CERAMIC (Hips) ×1 IMPLANT
BLADE SAW SGTL 18X1.27X75 (BLADE) ×2 IMPLANT
BRUSH FEMORAL CANAL (MISCELLANEOUS) IMPLANT
CLOTH BEACON ORANGE TIMEOUT ST (SAFETY) ×2 IMPLANT
CUP SECTOR GRIPTON 50MM (Cup) ×2 IMPLANT
DERMABOND ADVANCED (GAUZE/BANDAGES/DRESSINGS) ×1
DERMABOND ADVANCED .7 DNX12 (GAUZE/BANDAGES/DRESSINGS) ×1 IMPLANT
DRAPE INCISE IOBAN 85X60 (DRAPES) ×2 IMPLANT
DRAPE ORTHO SPLIT 77X108 STRL (DRAPES) ×4
DRAPE POUCH INSTRU U-SHP 10X18 (DRAPES) ×2 IMPLANT
DRAPE SURG 17X11 SM STRL (DRAPES) ×2 IMPLANT
DRAPE SURG ORHT 6 SPLT 77X108 (DRAPES) ×2 IMPLANT
DRAPE U-SHAPE 47X51 STRL (DRAPES) ×2 IMPLANT
DRSG AQUACEL AG ADV 3.5X10 (GAUZE/BANDAGES/DRESSINGS) IMPLANT
DRSG AQUACEL AG ADV 3.5X14 (GAUZE/BANDAGES/DRESSINGS) ×2 IMPLANT
DRSG EMULSION OIL 3X16 NADH (GAUZE/BANDAGES/DRESSINGS) ×2 IMPLANT
DRSG MEPILEX BORDER 4X4 (GAUZE/BANDAGES/DRESSINGS) ×2 IMPLANT
DRSG MEPILEX BORDER 4X8 (GAUZE/BANDAGES/DRESSINGS) ×2 IMPLANT
DRSG TEGADERM 4X4.75 (GAUZE/BANDAGES/DRESSINGS) ×2 IMPLANT
DURAPREP 26ML APPLICATOR (WOUND CARE) ×2 IMPLANT
ELECT BLADE TIP CTD 4 INCH (ELECTRODE) ×2 IMPLANT
ELECT REM PT RETURN 9FT ADLT (ELECTROSURGICAL) ×2
ELECTRODE REM PT RTRN 9FT ADLT (ELECTROSURGICAL) ×1 IMPLANT
ELIMINATOR HOLE APEX DEPUY (Hips) ×2 IMPLANT
EVACUATOR 1/8 PVC DRAIN (DRAIN) ×2 IMPLANT
FACESHIELD LNG OPTICON STERILE (SAFETY) ×8 IMPLANT
GAUZE SPONGE 2X2 8PLY STRL LF (GAUZE/BANDAGES/DRESSINGS) ×1 IMPLANT
GLOVE BIOGEL PI IND STRL 7.5 (GLOVE) ×1 IMPLANT
GLOVE BIOGEL PI IND STRL 8 (GLOVE) ×1 IMPLANT
GLOVE BIOGEL PI INDICATOR 7.5 (GLOVE) ×1
GLOVE BIOGEL PI INDICATOR 8 (GLOVE) ×1
GLOVE ECLIPSE 8.0 STRL XLNG CF (GLOVE) ×4 IMPLANT
GOWN BRE IMP PREV XXLGXLNG (GOWN DISPOSABLE) ×4 IMPLANT
GOWN PREVENTION PLUS LG XLONG (DISPOSABLE) ×2 IMPLANT
HANDPIECE INTERPULSE COAX TIP (DISPOSABLE)
HIP BALL CERAMIC (Hips) ×2 IMPLANT
KIT BASIN OR (CUSTOM PROCEDURE TRAY) ×2 IMPLANT
LINER NEUTRAL 52MMX36MMX56 (Hips) ×2 IMPLANT
LINER PINN ALTRX ACE P4 HIP (Liner) ×2 IMPLANT
MANIFOLD NEPTUNE II (INSTRUMENTS) ×2 IMPLANT
NS IRRIG 1000ML POUR BTL (IV SOLUTION) ×4 IMPLANT
PACK TOTAL JOINT (CUSTOM PROCEDURE TRAY) ×2 IMPLANT
POSITIONER SURGICAL ARM (MISCELLANEOUS) ×2 IMPLANT
PRESSURIZER FEMORAL UNIV (MISCELLANEOUS) IMPLANT
SCREW 6.5MMX25MM (Screw) ×2 IMPLANT
SET HNDPC FAN SPRY TIP SCT (DISPOSABLE) IMPLANT
SPONGE GAUZE 2X2 STER 10/PKG (GAUZE/BANDAGES/DRESSINGS) ×1
SPONGE LAP 18X18 X RAY DECT (DISPOSABLE) ×2 IMPLANT
SPONGE LAP 4X18 X RAY DECT (DISPOSABLE) ×2 IMPLANT
STAPLER VISISTAT 35W (STAPLE) ×2 IMPLANT
SUCTION FRAZIER TIP 10 FR DISP (SUCTIONS) ×2 IMPLANT
SUT VIC AB 1 CT1 36 (SUTURE) ×6 IMPLANT
SUT VIC AB 2-0 CT1 27 (SUTURE) ×6
SUT VIC AB 2-0 CT1 TAPERPNT 27 (SUTURE) ×3 IMPLANT
SUT VLOC 180 0 24IN GS25 (SUTURE) ×4 IMPLANT
TOWEL OR 17X26 10 PK STRL BLUE (TOWEL DISPOSABLE) ×4 IMPLANT
TOWER CARTRIDGE SMART MIX (DISPOSABLE) IMPLANT
TRAY FOLEY CATH 14FRSI W/METER (CATHETERS) ×2 IMPLANT
WATER STERILE IRR 1500ML POUR (IV SOLUTION) ×2 IMPLANT

## 2012-12-17 NOTE — Anesthesia Postprocedure Evaluation (Signed)
  Anesthesia Post-op Note  Patient: Renee Waters  Procedure(s) Performed: Procedure(s) (LRB): RIGHT TOTAL HIP REVISION (Right)  Patient Location: PACU  Anesthesia Type: General  Level of Consciousness: awake and alert   Airway and Oxygen Therapy: Patient Spontanous Breathing  Post-op Pain: mild  Post-op Assessment: Post-op Vital signs reviewed, Patient's Cardiovascular Status Stable, Respiratory Function Stable, Patent Airway and No signs of Nausea or vomiting  Last Vitals:  Filed Vitals:   12/17/12 1615  BP: 164/73  Pulse: 77  Temp:   Resp: 18    Post-op Vital Signs: stable   Complications: No apparent anesthesia complications

## 2012-12-17 NOTE — Interval H&P Note (Signed)
History and Physical Interval Note:  12/17/2012 12:34 PM  Renee Waters  has presented today for surgery, with the diagnosis of FAILED RIGHT TOTAL HIP ARTHROPLASTY  The various methods of treatment have been discussed with the patient and family. After consideration of risks, benefits and other options for treatment, the patient has consented to  Procedure(s): RIGHT TOTAL HIP REVISION (Right) as a surgical intervention .  The patient's history has been reviewed, patient examined, no change in status, stable for surgery.  I have reviewed the patient's chart and labs.  Questions were answered to the patient's satisfaction.     [name]

## 2012-12-17 NOTE — Brief Op Note (Signed)
12/17/2012  3:21 PM  PATIENT:  Renee Waters  67 y.o. female  PRE-OPERATIVE DIAGNOSIS:  FAILED RIGHT TOTAL HIP ARTHROPLASTY  POST-OPERATIVE DIAGNOSIS:  FAILED RIGHT TOTAL HIP ARTHROPLASTY  PROCEDURE:  Procedure(s): RIGHT TOTAL HIP REVISION (Right)  SURGEON:  Surgeon(s) and Role:    * [name], MD - Primary  PHYSICIAN ASSISTANT: Lanney Gins, PA-C  ANESTHESIA:   general  EBL:  Total I/O In: 2000 [I.V.:2000] Out: 1075 [Urine:625; Blood:450]  BLOOD ADMINISTERED:none  DRAINS: none   LOCAL MEDICATIONS USED:  NONE  SPECIMEN:  No Specimen  DISPOSITION OF SPECIMEN:  N/A  COUNTS:  YES  TOURNIQUET:  * No tourniquets in log *  DICTATION: .Dragon Dictation  PLAN OF CARE: Admit to inpatient   PATIENT DISPOSITION:  PACU - hemodynamically stable.   Delay start of Pharmacological VTE agent (>24hrs) due to surgical blood loss or risk of bleeding: no  Indication of procedure: Renee Waters is a 67 year old female with a history of a right total hip replacement and that presented to the office for a second opinion evaluation due to persistent pain. She not only had her total of arthroplasty performed but also a followup procedure for recession of the iliotibial band over the greater trochanter due to persistent pain.  Upon review of her radiographs we identified an acetabular cup that was significantly anteverted as well as vertically placed within the acetabulum. I felt that based on the persistence of her pain in the radiographic findings that this could very well represent a source of her pain.  We discussed the risks of infection, DVT, component failure, and the need for future revision surgery compared to the potential benefit of pain relief and improve functional capabilities. Consent was obtained for the benefit of pain relief  Procedure in detail: Renee Waters is a 67 year old female brought operative theater. Once adequate anesthesia and preoperative antibiotics, Ancef  administered she was positioned into the left lateral decubitus position with the right side up. Her right lower extremity was then prepped and draped in a sterile fashion. A timeout was performed identifying the patient planned procedure and the extremity.  Patient's old incision had been identified and demarcated. At the onset of the procedure her old incision was excised and soft tissue planes created. The iliotibial band and gluteal fascia were then incised for a posterior approach to the hip.  The posterior pseudocapsule was taken off of the posterior aspect of the proximal femur and the posterior two thirds of the hip exposed. The abnormal position of the acetabular shell was immediately identified and confirmed from radiographic findings. Following the exposure the posterior aspect of the acetabulum the hip was dislocated and the old femoral head disimpacted from the trunnion. The trunnion was then placed onto the ilium just proximal to the acetabular shell.  Utilizing the Innomed explant system the acetabular component was removed without bone loss. The old acetabular shell was identified to be size 52 mm in diameter. I began reaming with a 45 mm reamer attempting to reestablish the normal anatomic orientation of her pelvis. I subsequently reamed up by 2 mm increments to 49 mm reamer and felt that I did not need to ream further based on remaining bone stock. I selected a 50 mm Gription Pinnacle cup and subsequently impacted it into the acetabular bed with approximately 35-40 of abduction 20 of forward flexion. I confirm the orientation of the new shell utilizing a cup positioning guide, placed a single cancellus screw into the ilium, and then  performed a trial reduction with a trial liner placed.  At first I used a 32+5 trial head ball. With this in place I found there to be a little bit of posterior subluxation with hip flexion and internal rotation to 70-80. Otherwise her leg lengths appeared to be  equal there was no evidence of impingement posteriorly with external rotation and extension. Given these findings the trial components were removed. I selected and then impacted a 32+4, 10 face changing liner and impacted this into the acetabular shell. I then selected a 32+5 tilt a ceramic ball and impacted this onto a clean and dry trunnion. The hip was reduced. The hip would be irrigated again at this point as it had been throughout the case. I elected not to place a Hemovac drain.  The wound was then closed in layers with a combination of #1 Vicryl, and #0 V-lock sutures used to reapproximate the iliotibial band and gluteal fascia. The subcutaneous tissues was reapproximated using 2-0 Vicryl and a running 4-0 Monocryl was used as a subcuticular stitch. The hip wound was then cleaned and dried, and dressed sterilely with Dermabond and an Aquacell dressing.  She was then awoken from anesthesia and brought to the recovery room in stable condition tolerating the procedure well. She'll be admitted to the hospital for perioperative observation and management. She'll be weightbearing as tolerated on the right lower extremity.  I will plan to see her in the office in 2 weeks' time for evaluation of her wound and to check on her progress overall. Findings were reviewed with her husband. She wished to have her old acetabular shell which was sent to be cleaned so it can be given to her before discharge.

## 2012-12-17 NOTE — Transfer of Care (Signed)
Immediate Anesthesia Transfer of Care Note  Patient: Renee Waters  Procedure(s) Performed: Procedure(s) (LRB): RIGHT TOTAL HIP REVISION (Right)  Patient Location: PACU  Anesthesia Type: General  Level of Consciousness: sedated, patient cooperative and responds to stimulation  Airway & Oxygen Therapy: Patient Spontanous Breathing and Patient connected to face mask oxgen  Post-op Assessment: Report given to PACU RN and Post -op Vital signs reviewed and stable  Post vital signs: Reviewed and stable  Complications: No apparent anesthesia complications

## 2012-12-17 NOTE — Anesthesia Preprocedure Evaluation (Addendum)
Anesthesia Evaluation  Patient identified by MRN, date of birth, ID band Patient awake    Reviewed: Allergy & Precautions, H&P , NPO status , Patient's Chart, lab work & pertinent test results  Airway Mallampati: II TM Distance: >3 FB Neck ROM: full    Dental  (+) Caps and Dental Advisory Given Bridge cemented in all lower front:   Pulmonary neg pulmonary ROS, former smoker,  breath sounds clear to auscultation  Pulmonary exam normal       Cardiovascular Exercise Tolerance: Good hypertension, Pt. on medications Rhythm:regular Rate:Normal     Neuro/Psych Anxiety Depression negative neurological ROS  negative psych ROS   GI/Hepatic negative GI ROS, Neg liver ROS, GERD-  Medicated and Controlled,  Endo/Other  negative endocrine ROS  Renal/GU negative Renal ROS  negative genitourinary   Musculoskeletal   Abdominal   Peds  Hematology negative hematology ROS (+)   Anesthesia Other Findings Systemic lupus  Reproductive/Obstetrics negative OB ROS                          Anesthesia Physical Anesthesia Plan  ASA: II  Anesthesia Plan: General   Post-op Pain Management:    Induction: Intravenous  Airway Management Planned: Oral ETT  Additional Equipment:   Intra-op Plan:   Post-operative Plan: Extubation in OR  Informed Consent: I have reviewed the patients History and Physical, chart, labs and discussed the procedure including the risks, benefits and alternatives for the proposed anesthesia with the patient or authorized representative who has indicated his/her understanding and acceptance.   Dental Advisory Given  Plan Discussed with: CRNA and Surgeon  Anesthesia Plan Comments:        Anesthesia Quick Evaluation

## 2012-12-18 LAB — CBC
HCT: 30.3 % — ABNORMAL LOW (ref 36.0–46.0)
Hemoglobin: 10.4 g/dL — ABNORMAL LOW (ref 12.0–15.0)
MCH: 31.1 pg (ref 26.0–34.0)
MCHC: 34.3 g/dL (ref 30.0–36.0)
Platelets: 127 10*3/uL — ABNORMAL LOW (ref 150–400)
RBC: 3.34 MIL/uL — ABNORMAL LOW (ref 3.87–5.11)

## 2012-12-18 LAB — BASIC METABOLIC PANEL
BUN: 9 mg/dL (ref 6–23)
CO2: 25 mEq/L (ref 19–32)
Calcium: 8.8 mg/dL (ref 8.4–10.5)
Chloride: 103 mEq/L (ref 96–112)
GFR calc Af Amer: 83 mL/min — ABNORMAL LOW (ref >90)
GFR calc non Af Amer: 72 mL/min — ABNORMAL LOW (ref >90)
Glucose, Bld: 121 mg/dL — ABNORMAL HIGH (ref 70–99)
Potassium: 4.1 mEq/L (ref 3.5–5.1)
Sodium: 136 mEq/L (ref 135–145)

## 2012-12-18 LAB — GLUCOSE, CAPILLARY: Glucose-Capillary: 151 mg/dL — ABNORMAL HIGH (ref 70–99)

## 2012-12-18 MED ORDER — ALPRAZOLAM 0.5 MG PO TABS
0.5000 mg | ORAL_TABLET | Freq: Three times a day (TID) | ORAL | Status: DC | PRN
  Administered 2012-12-18 (×2): 0.5 mg via ORAL
  Filled 2012-12-18 (×2): qty 1

## 2012-12-18 MED ORDER — KETOROLAC TROMETHAMINE 15 MG/ML IJ SOLN
15.0000 mg | Freq: Four times a day (QID) | INTRAMUSCULAR | Status: DC
  Administered 2012-12-18 (×2): 15 mg via INTRAVENOUS
  Filled 2012-12-18 (×3): qty 1

## 2012-12-18 MED ORDER — KETOROLAC TROMETHAMINE 10 MG PO TABS
10.0000 mg | ORAL_TABLET | Freq: Four times a day (QID) | ORAL | Status: DC | PRN
  Administered 2012-12-18: 10 mg via ORAL
  Filled 2012-12-18: qty 1

## 2012-12-18 NOTE — Progress Notes (Signed)
Physical Therapy Treatment Patient Details Name: Renee Waters MRN: [id number] DOB: February 27, 1945 Today's Date: 12/18/2012 Time: 4098-1191 PT Time Calculation (min): 18 min  PT Assessment / Plan / Recommendation  History of Present Illness 67 yo female s/p R THA revision-posterior. Hx of lupus.    PT Comments   Progressing with mobility. Plan is for possible d/c home tomorrow.   Follow Up Recommendations  Home health PT     Does the patient have the potential to tolerate intense rehabilitation     Barriers to Discharge        Equipment Recommendations  Rolling walker with 5" wheels    Recommendations for Other Services OT consult  Frequency 7X/week   Progress towards PT Goals Progress towards PT goals: Progressing toward goals  Plan Current plan remains appropriate    Precautions / Restrictions Precautions Precautions: Posterior Hip;Fall Precaution Comments: Reviewed post precautions and WB status Restrictions Weight Bearing Restrictions: No RLE Weight Bearing: Weight bearing as tolerated   Pertinent Vitals/Pain 5/10 R hip with activity. Ice applied end of session    Mobility  Bed Mobility Bed Mobility: Supine to Sit;Sit to Supine Supine to Sit: 4: Min assist Sit to Supine: 4: Min assist Details for Bed Mobility Assistance: assist for RLE Transfers Transfers: Sit to Stand;Stand to Sit Sit to Stand: 4: Min guard;From bed;From elevated surface;From chair/3-in-1 Stand to Sit: 4: Min guard;To bed;To chair/3-in-1 Details for Transfer Assistance: x 2. close guard for safety. VCs hand placement, safety Ambulation/Gait Ambulation/Gait Assistance: 4: Min guard Ambulation Distance (Feet): 145 Feet Assistive device: Rolling walker Ambulation/Gait Assistance Details: close guard for safety. VCs adherence to precautions Gait Pattern: Step-to pattern;Decreased stride length;Antalgic;Decreased step length - right    Exercises     PT Diagnosis:    PT Problem List:   PT  Treatment Interventions:     PT Goals (current goals can now be found in the care plan section) Acute Rehab PT Goals Patient Stated Goal: don't overdo it and don't fall  Visit Information  Last PT Received On: 12/18/12 Assistance Needed: +1 History of Present Illness: 67 yo female s/p R THA revision-posterior. Hx of lupus.     Subjective Data  Patient Stated Goal: don't overdo it and don't fall   Cognition  Cognition Arousal/Alertness: Awake/alert Behavior During Therapy: WFL for tasks assessed/performed Overall Cognitive Status: Within Functional Limits for tasks assessed    Balance     End of Session PT - End of Session Activity Tolerance: Patient tolerated treatment well Patient left: in bed;with call bell/phone within reach;with family/visitor present (with pillow b/t knees-pt wanted to lie slightly onto L side)   GP     Rebeca Alert, MPT Pager: [phone]

## 2012-12-18 NOTE — Care Management Note (Addendum)
    Page 1 of 2   12/20/2012     5:55:59 PM   CARE MANAGEMENT NOTE 12/20/2012  Patient:  Renee Waters, Renee Waters   Account Number:  [id number]  Date Initiated:  12/18/2012  Documentation initiated by:  Colleen Can  Subjective/Objective Assessment:   dx failed rt total hip arthroplasty; Revision rt total knee     Action/Plan:   CM spoke with patient. Plans are for her to return to her home in [facility] where family members will be caregivers. She already has commode seat but will need RW. Wants [facility] for [facility] services.   Anticipated DC Date:  12/19/2012   Anticipated DC Plan:  HOME W HOME HEALTH SERVICES      DC Planning Services  CM consult      PAC Choice  DURABLE MEDICAL EQUIPMENT  HOME HEALTH   Choice offered to / List presented to:  C-1 Patient        HH arranged  HH-2 PT      [facility] agency  [facility]   Status of service:  Completed, signed off Medicare Important Message given?   (If response is "NO", the following Medicare IM given date fields will be blank) Date Medicare IM given:   Date Additional Medicare IM given:    Discharge Disposition:  HOME W HOME HEALTH SERVICES  Per UR Regulation:    If discussed at Long Length of Stay Meetings, dates discussed:    Comments:  12/19/2012 Colleen Can BSN RN CCM [phone] CM spoke with [facility] rep and was advised that they could not provide HHpt services in [place]. Pt was advised and she is requesting [facility] for HHpt services. [facility] rep-Stephanie states they would be able to provide [facility] services for PT. Services will start 12/20/2012.

## 2012-12-18 NOTE — Evaluation (Signed)
Occupational Therapy Evaluation Patient Details Name: Renee Waters MRN: [id number] DOB: Apr 09, 1945 Today's Date: 12/18/2012 Time: 8469-6295 OT Time Calculation (min): 17 min  OT Assessment / Plan / Recommendation History of present illness 67 yo female s/p R THA revision-posterior. Hx of lupus.    Clinical Impression   Pt was admitted for the above sx  She will benefit from continued ot in acute to reinforce thp's for increased safety and independence with adls with supervision goals    OT Assessment  Patient needs continued OT Services    Follow Up Recommendations  No OT follow up    Barriers to Discharge      Equipment Recommendations  None recommended by OT    Recommendations for Other Services    Frequency  Min 2X/week    Precautions / Restrictions Precautions Precautions: Fall;Posterior Hip Precaution Comments: Reviewed post precautions and WB status Restrictions Weight Bearing Restrictions: No RLE Weight Bearing: Weight bearing as tolerated   Pertinent Vitals/Pain 5/10 R hip  Repositioned in bed    ADL  Toilet Transfer: Minimal assistance Toilet Transfer Method: Sit to stand Toilet Transfer Equipment: Raised toilet seat with arms (or 3-in-1 over toilet) Equipment Used: Rolling walker Transfers/Ambulation Related to ADLs: ambulated to bathroom wtih min guard assist and min A for sit to stand.  Pt had pain and did not wish to practice shower transfer today.  Reviewed sequence ADL Comments: pt has AE kit and will use all except for sock aid:  plans to have her husband assist with this.  She did not want OT to review this.  Reviewed THPS with adls, At time of eval, pt needs supervision for UB adls (for precautions) and min A for LB adls with AE   OT Diagnosis: Generalized weakness  OT Problem List: Decreased activity tolerance;Pain;Decreased knowledge of use of DME or AE;Decreased knowledge of precautions OT Treatment Interventions: Self-care/ADL training;DME  and/or AE instruction;Patient/family education   OT Goals(Current goals can be found in the care plan section) Acute Rehab OT Goals Patient Stated Goal: don't overdo it and don't fall OT Goal Formulation: With patient Time For Goal Achievement: 12/25/12 Potential to Achieve Goals: Good ADL Goals Pt Will Perform Lower Body Bathing: with supervision;with adaptive equipment;sit to/from stand Pt Will Perform Lower Body Dressing: with supervision;with adaptive equipment;sit to/from stand Pt Will Transfer to Toilet: with supervision;ambulating;bedside commode Pt Will Perform Toileting - Clothing Manipulation and hygiene: with supervision;sit to/from stand Pt Will Perform Tub/Shower Transfer: with min guard assist;ambulating;Shower transfer;3 in 1 Additional ADL Goal #1: pt will verbalize 3/3 thps  Visit Information  Last OT Received On: 12/18/12 Assistance Needed: +1 History of Present Illness: 67 yo female s/p R THA revision-posterior. Hx of lupus.        Prior Functioning     Home Living Family/patient expects to be discharged to:: Private residence Living Arrangements: Spouse/significant other Type of Home: House Home Access: Stairs to enter [employer] of Steps: 4 Entrance Stairs-Rails: Right Home Layout: Two level;Able to live on main level with bedroom/bathroom Home Equipment: Walker - 4 wheels;Bedside commode;Cane - single point Prior Function Level of Independence: Independent with assistive device(s) Comments: used walker vs cane. Communication Communication: No difficulties         Vision/Perception     Cognition  Cognition Arousal/Alertness: Awake/alert Behavior During Therapy: WFL for tasks assessed/performed Overall Cognitive Status: Within Functional Limits for tasks assessed    Extremity/Trunk Assessment Upper Extremity Assessment Upper Extremity Assessment: Overall WFL for tasks assessed  Mobility Bed Mobility Bed Mobility: Supine to  Sit Sit to Supine: 4: Min assist Details for Bed Mobility Assistance: assist for RLE Transfers Sit to Stand: 4: Min assist;From chair/3-in-1;With upper extremity assist Stand to Sit: 4: Min guard;To bed;To chair/3-in-1 Details for Transfer Assistance: assist to rise/steady     Exercise     Balance     End of Session OT - End of Session Activity Tolerance: Patient limited by pain Patient left: in bed;with call bell/phone within reach  GO     Renee Waters 12/18/2012, 12:12 PM Marica Otter, OTR/L [phone] 12/18/2012

## 2012-12-18 NOTE — Evaluation (Signed)
Physical Therapy Evaluation Patient Details Name: Renee Waters MRN: [id number] DOB: 1946/01/06 Today's Date: 12/18/2012 Time: 4098-1191 PT Time Calculation (min): 14 min  PT Assessment / Plan / Recommendation History of Present Illness  67 yo female s/p R THA revision-posterior. Hx of lupus.   Clinical Impression  On eval, pt required Min assist for mobility-able to ambulate ~105 feet with walker. Most difficulty was with bed mobility. Anticipate pt will progress well during stay. Recommend HHPT, RW    PT Assessment  Patient needs continued PT services    Follow Up Recommendations  Home health PT    Does the patient have the potential to tolerate intense rehabilitation      Barriers to Discharge        Equipment Recommendations  Rolling walker with 5" wheels    Recommendations for Other Services OT consult   Frequency 7X/week    Precautions / Restrictions Precautions Precautions: Fall;Posterior Hip Precaution Comments: Reviewed post precautions and WB status Restrictions Weight Bearing Restrictions: No RLE Weight Bearing: Weight bearing as tolerated   Pertinent Vitals/Pain 5/10 R hip with activity.       Mobility  Bed Mobility Bed Mobility: Supine to Sit Supine to Sit: 4: Min assist Details for Bed Mobility Assistance: Assist for R LE off bed. Increased time.  Transfers Transfers: Sit to Stand;Stand to Sit Sit to Stand: 4: Min assist;From bed Stand to Sit: 4: Min guard;To chair/3-in-1;With armrests Details for Transfer Assistance: Assist to rise, stabilize. VCs safety.  Ambulation/Gait Ambulation/Gait Assistance: 4: Min guard Ambulation Distance (Feet): 105 Feet Assistive device: Rolling walker Ambulation/Gait Assistance Details: VCs safety, technique, sequence. slow gait speed.  Gait Pattern: Step-to pattern;Decreased stride length;Antalgic;Decreased step length - right    Exercises     PT Diagnosis: Difficulty walking;Abnormality of gait;Acute pain   PT Problem List: Decreased strength;Decreased range of motion;Decreased activity tolerance;Decreased mobility;Pain;Decreased knowledge of use of DME;Decreased knowledge of precautions PT Treatment Interventions: DME instruction;Gait training;Stair training;Functional mobility training;Therapeutic activities;Therapeutic exercise;Patient/family education     PT Goals(Current goals can be found in the care plan section) Acute Rehab PT Goals Patient Stated Goal: regain independence PT Goal Formulation: With patient Time For Goal Achievement: 12/25/12 Potential to Achieve Goals: Good  Visit Information  Last PT Received On: 12/18/12 Assistance Needed: +1 History of Present Illness: 67 yo female s/p R THA revision-posterior. Hx of lupus.        Prior Functioning  Home Living Family/patient expects to be discharged to:: Private residence Living Arrangements: Spouse/significant other Type of Home: House Home Access: Stairs to enter [employer] of Steps: 4 Entrance Stairs-Rails: Right Home Layout: Two level;Able to live on main level with bedroom/bathroom Home Equipment: Walker - 4 wheels;Bedside commode;Cane - single point Prior Function Level of Independence: Independent with assistive device(s) Comments: used walker vs cane. Communication Communication: No difficulties    Cognition       Extremity/Trunk Assessment Upper Extremity Assessment Upper Extremity Assessment: Overall WFL for tasks assessed Lower Extremity Assessment Lower Extremity Assessment: RLE deficits/detail RLE Deficits / Details: hip abd/add 2/5, hip flex 2/5, moves ankle well. Limited by pain Cervical / Trunk Assessment Cervical / Trunk Assessment: Normal   Balance    End of Session PT - End of Session Activity Tolerance: Patient tolerated treatment well Patient left: with family/visitor present;with call bell/phone within reach;with nursing/sitter in room  GP     Rebeca Alert, MPT Pager:  [phone]

## 2012-12-18 NOTE — Progress Notes (Signed)
Utilization review completed.  

## 2012-12-19 DIAGNOSIS — D500 Iron deficiency anemia secondary to blood loss (chronic): Secondary | ICD-10-CM | POA: Diagnosis not present

## 2012-12-19 LAB — CBC
HCT: 28.9 % — ABNORMAL LOW (ref 36.0–46.0)
Hemoglobin: 9.9 g/dL — ABNORMAL LOW (ref 12.0–15.0)
MCH: 30.7 pg (ref 26.0–34.0)
MCHC: 34.3 g/dL (ref 30.0–36.0)
MCV: 89.8 fL (ref 78.0–100.0)
RBC: 3.22 MIL/uL — ABNORMAL LOW (ref 3.87–5.11)
WBC: 13 10*3/uL — ABNORMAL HIGH (ref 4.0–10.5)

## 2012-12-19 LAB — BASIC METABOLIC PANEL
BUN: 13 mg/dL (ref 6–23)
CO2: 24 mEq/L (ref 19–32)
Calcium: 9 mg/dL (ref 8.4–10.5)
Creatinine, Ser: 0.75 mg/dL (ref 0.50–1.10)
GFR calc Af Amer: >90 mL/min (ref >90)
GFR calc non Af Amer: 86 mL/min — ABNORMAL LOW (ref >90)
Glucose, Bld: 119 mg/dL — ABNORMAL HIGH (ref 70–99)
Potassium: 3.6 mEq/L (ref 3.5–5.1)

## 2012-12-19 MED ORDER — HYDROMORPHONE HCL 4 MG PO TABS: 4.0000 mg | ORAL_TABLET | ORAL | Status: DC | PRN

## 2012-12-19 MED ORDER — POLYETHYLENE GLYCOL 3350 17 G PO PACK: 17.0000 g | PACK | Freq: Two times a day (BID) | ORAL | Status: DC

## 2012-12-19 MED ORDER — DSS 100 MG PO CAPS: 100.0000 mg | ORAL_CAPSULE | Freq: Two times a day (BID) | ORAL | Status: DC

## 2012-12-19 MED ORDER — TIZANIDINE HCL 4 MG PO TABS: 4.0000 mg | ORAL_TABLET | Freq: Four times a day (QID) | ORAL | Status: DC | PRN

## 2012-12-19 MED ORDER — ASPIRIN 325 MG PO TBEC: 325.0000 mg | DELAYED_RELEASE_TABLET | Freq: Two times a day (BID) | ORAL | Status: AC

## 2012-12-19 MED ORDER — FERROUS SULFATE 325 (65 FE) MG PO TABS: 325.0000 mg | ORAL_TABLET | Freq: Three times a day (TID) | ORAL | Status: DC

## 2012-12-19 NOTE — Progress Notes (Signed)
Physical Therapy Treatment Patient Details Name: Renee Waters MRN: [id number] DOB: 04/21/1945 Today's Date: 12/19/2012 Time: 0950-1002 PT Time Calculation (min): 12 min  PT Assessment / Plan / Recommendation  History of Present Illness 67 yo female s/p R THA revision-posterior. Hx of lupus.    PT Comments   Progressing well with mobility. Completed all education. Practiced gait, exercises, steps. Reviewed car transfer. Ready to d/c from PT standpoint.   Follow Up Recommendations  Home health PT     Does the patient have the potential to tolerate intense rehabilitation     Barriers to Discharge        Equipment Recommendations  Rolling walker with 5" wheels    Recommendations for Other Services    Frequency 7X/week   Progress towards PT Goals Progress towards PT goals: Progressing toward goals  Plan Current plan remains appropriate    Precautions / Restrictions Precautions Precautions: Posterior Hip;Fall Precaution Comments: pt recalls thps, but needs occasional cues during functional activities Restrictions Weight Bearing Restrictions: No RLE Weight Bearing: Weight bearing as tolerated   Pertinent Vitals/Pain R hip 4/10 with activity. Ice applied end of session    Mobility  Bed Mobility Bed Mobility: Sit to Supine Sit to Supine: 4: Min assist Details for Bed Mobility Assistance: assist for RLE Transfers Transfers: Sit to Stand;Stand to Sit Sit to Stand: 5: Supervision;From chair/3-in-1 Stand to Sit: 5: Supervision;To bed Details for Transfer Assistance: cues for hand placement Ambulation/Gait Ambulation/Gait Assistance: 4: Min guard Ambulation Distance (Feet): 100 Feet Assistive device: Rolling walker Ambulation/Gait Assistance Details: close guard for safety Gait Pattern: Step-to pattern;Decreased stride length;Antalgic;Decreased step length - right Stairs: Yes Stairs Assistance: 4: Min guard Stairs Assistance Details (indicate cue type and reason): VCs  safety, technique, sequence. Pt states she will have 2 rails to use at home.  Stair Management Technique: Two rails Number of Stairs: 3    Exercises Total Joint Exercises Ankle Circles/Pumps: AROM;Both;10 reps;Supine Quad Sets: AROM;Both;10 reps;Supine Heel Slides: AAROM;Right;10 reps;Supine Hip ABduction/ADduction: AAROM;Right;10 reps;Supine Long Arc Quad: AROM;Right;10 reps;Seated   PT Diagnosis:    PT Problem List:   PT Treatment Interventions:     PT Goals (current goals can now be found in the care plan section)    Visit Information  Last PT Received On: 12/19/12 Assistance Needed: +1 History of Present Illness: 67 yo female s/p R THA revision-posterior. Hx of lupus.     Subjective Data      Cognition  Cognition Arousal/Alertness: Awake/alert Behavior During Therapy: WFL for tasks assessed/performed Overall Cognitive Status: Within Functional Limits for tasks assessed    Balance     End of Session PT - End of Session Equipment Utilized During Treatment: Gait belt Activity Tolerance: Patient tolerated treatment well Patient left: with call bell/phone within reach   GP     Rebeca Alert, MPT Pager: [phone]

## 2012-12-19 NOTE — Progress Notes (Signed)
Occupational Therapy Treatment Patient Details Name: Renee Waters MRN: [id number] DOB: [birth year] Today's Date: 12/19/2012 Time: 4098-1191 OT Time Calculation (min): 20 min  OT Assessment / Plan / Recommendation  History of present illness     OT comments  Pt has met all goals:  adls and bathroom transfers are performed at supervision level.  Pt needed one cue today during functional task, not to break 90 degree precaution.  She verbalizes understanding of all.  Follow Up Recommendations  No OT follow up    Barriers to Discharge       Equipment Recommendations  None recommended by OT    Recommendations for Other Services    Frequency     Progress towards OT Goals Progress towards OT goals: Goals met/education completed, patient discharged from OT  Plan Discharge plan remains appropriate    Precautions / Restrictions Precautions Precautions: Posterior Hip;Fall Precaution Comments: pt recalls thps, but needs occasional cues during functional activities Restrictions RLE Weight Bearing: Weight bearing as tolerated   Pertinent Vitals/Pain No c/o pain.      ADL  Lower Body Bathing: Supervision/safety Where Assessed - Lower Body Bathing: Supported sit to stand (with ae) Lower Body Dressing: Supervision/safety (simulated pants with reacher (pillowcase)) Where Assessed - Lower Body Dressing: Supported sit to Pharmacist, hospital: Buyer, retail Method: Sit to Barista:  (bed, ambulated, bed) Toileting - Architect and Hygiene: Simulated;Supervision/safety Where Assessed - Engineer, mining and Hygiene: Sit to stand from 3-in-1 or toilet Tub/Shower Transfer: Supervision/safety Tub/Shower Transfer Method: Science writer: Walk in Scientist, research (physical sciences) Used: Theatre stage manager Transfers/Ambulation Related to ADLs: supervision for ambulation to bathroom ADL Comments: pt  needed one cue not to reach beyond 90 when applying lotion.  Pt was talking and performing task automatically    OT Diagnosis:    OT Problem List:   OT Treatment Interventions:     OT Goals(current goals can now be found in the care plan section)    Visit Information  Last OT Received On: 12/19/12 Assistance Needed: +1    Subjective Data      Prior Functioning       Cognition  Cognition Arousal/Alertness: Awake/alert Behavior During Therapy: WFL for tasks assessed/performed Overall Cognitive Status: Within Functional Limits for tasks assessed    Mobility  Bed Mobility Sit to Supine: 4: Min assist Details for Bed Mobility Assistance: assist for RLE Transfers Sit to Stand: 5: Supervision;From bed;With upper extremity assist Details for Transfer Assistance: cues for hand placement    Exercises      Balance     End of Session  left pt in bed with call bell within reach  GO     [facility] 12/19/2012, 9:24 AM Marica Otter, OTR/L [phone] 12/19/2012

## 2012-12-19 NOTE — Progress Notes (Signed)
   Subjective: 2 Days Post-Op Procedure(s) (LRB): RIGHT TOTAL HIP REVISION (Right)   Patient reports pain as mild, pain controlled. Urinating better than she was yesterday. Yesterday had a little anxiety over not being able to urinate, however better today. Also had a BM without issues. No events throughout the night. Ready to be discharged home.  Objective:   VITALS:   Filed Vitals:   12/19/12 0431  BP: 133/76  Pulse: 61  Temp: 98.6 F (37 C)  Resp: 16    Neurovascular intact Dorsiflexion/Plantar flexion intact Incision: dressing C/D/I No cellulitis present Compartment soft  LABS  Recent Labs  12/18/12 0425 12/19/12 0448  HGB 10.4* 9.9*  HCT 30.3* 28.9*  WBC 12.1* 13.0*  PLT 127* 113*     Recent Labs  12/18/12 0425 12/19/12 0448  NA 136 136  K 4.1 3.6  BUN 9 13  CREATININE 0.83 0.75  GLUCOSE 121* 119*     Assessment/Plan: 2 Days Post-Op Procedure(s) (LRB): RIGHT TOTAL HIP REVISION (Right) Up with therapy Discharge home with home health Follow up in 2 weeks at [facility]. Follow up with OLIN,Carsten Carstarphen D in 2 weeks.  Contact information:  [facility] [address], [address] [place] [address] [phone]    Expected ABLA  Treated with iron and will observe        Anastasio Auerbach. Junita Kubota   PAC  12/19/2012, 9:49 AM

## 2012-12-19 NOTE — Progress Notes (Signed)
   Subjective: 1 Day Post-Op Procedure(s) (LRB): RIGHT TOTAL HIP REVISION (Right)   Patient reports pain as mild, pain controlled. No events throughout the night.  Objective:   VITALS:   Filed Vitals:   12/18/12  BP: 144/81  Pulse: 83  Temp: 99.3 F (37.4 C)   Resp: 16    Neurovascular intact Dorsiflexion/Plantar flexion intact Incision: dressing C/D/I No cellulitis present Compartment soft  LABS  Recent Labs  12/18/12 0425  HGB 10.4*  HCT 30.3*  WBC 12.1*  PLT 127*     Recent Labs  12/18/12 0425  NA 136  K 4.1  BUN 9  CREATININE 0.83  GLUCOSE 121*     Assessment/Plan: 1 Day Post-Op Procedure(s) (LRB): RIGHT TOTAL HIP REVISION (Right) HV drain d/c'ed Foley cath d/c'ed Advance diet Up with therapy D/C IV fluids Discharge home with home health when ready  Expected ABLA  Treated with iron and will observe       Anastasio Auerbach. Sindee Stucker   PAC  12/19/2012, 9:53 AM

## 2012-12-20 ENCOUNTER — Encounter (HOSPITAL_COMMUNITY): Payer: Self-pay | Admitting: Orthopedic Surgery

## 2012-12-20 ENCOUNTER — Encounter: Payer: Self-pay | Admitting: Family Medicine

## 2012-12-20 DIAGNOSIS — M329 Systemic lupus erythematosus, unspecified: Secondary | ICD-10-CM | POA: Diagnosis not present

## 2012-12-20 DIAGNOSIS — Z471 Aftercare following joint replacement surgery: Secondary | ICD-10-CM | POA: Diagnosis not present

## 2012-12-20 DIAGNOSIS — M159 Polyosteoarthritis, unspecified: Secondary | ICD-10-CM | POA: Diagnosis not present

## 2012-12-20 DIAGNOSIS — IMO0001 Reserved for inherently not codable concepts without codable children: Secondary | ICD-10-CM | POA: Diagnosis not present

## 2012-12-20 DIAGNOSIS — Z96649 Presence of unspecified artificial hip joint: Secondary | ICD-10-CM | POA: Diagnosis not present

## 2012-12-21 NOTE — Discharge Summary (Signed)
Physician Discharge Summary  Patient ID: Renee Waters MRN: [id number] DOB/AGE: May 18, 1945 67 y.o.  Admit date: 12/17/2012 Discharge date: 12/19/2012   Procedures:  Procedure(s) (LRB): RIGHT TOTAL HIP REVISION (Right)  Attending Physician:  Dr. Durene Romans   Admission Diagnoses:   Right hip pain s/p right total hip arthroplasty  Discharge Diagnoses:  Principal Problem:   S/P right hip revision Active Problems:   Expected blood loss anemia  Past Medical History  Diagnosis Date  . Insomnia   . Chronic back pain   . Chronic neck pain   . Depression   . GERD (gastroesophageal reflux disease)   . Osteoarthritis   . Genital herpes   . Hypertension   . S/P colonoscopy June 2005    normal, no polyps  . S/P endoscopy June 2005, Oct 2009    2005: short-segment Barrett's, 2009: short-segment Barrett's  . Barrett's esophagus   . UTI (lower urinary tract infection) 10/14    currently on Penicillin-  states is improving  . History of blood transfusion     HPI: Renee Waters, 67 y.o. female, with a history of a right total hip arthroplasty performed in 2011 by Dr. Dewaine Conger. She's had persistent discomfort involving the anterior and anterior lateral aspects of her hip. She underwent an iliotibial band recession-type procedure in 2012 by Dr. Chaney Malling with persistent anterior hip pain. She continues to have complaints of pain in the anterior aspect of the hip with certain activities. She has a difficult time with bearing weight. She also has persistent pain on the lateral side of the hip. She's had no problems with infection or wound healing, no fevers, chills or night sweats recently. Given the fact she has had the pain and her age, options were discussed with her --one being to continue to observe it conservatively without any surgical intervention. The other would be to revise the acetabular component. At think at the very least, revising the acetabular component would improve  the chances of polyethylene wear by decreasing the peak stresses at the apex of the poly. There would be concerned about the potential for impingement due to the orientation of the cup and the fact that it could contribute to her pain. There is a very good chance that, by reorienting her cup, she would see significant improvement. She does not have any acetabular screws placed. It is possible the cup could have changed orientation with time. It is possible the cup could be loose. Plan is to revise the acetabular component. Dr. Charlann Boxer discussed this with her, the postoperative course and expectations, including the fact that she would have persistent lateral hip pain based on her size and scarring of the lateral iliotibial band. Risks of infection and DVT were reviewed. We discussed her post op. course last time requiring a 10-day hospital stay and 6 units of blood transfusion. I do not anticipate that would be the issue with this revision surgery.  PCP: Syliva Overman, MD   Discharged Condition: good  Hospital Course:  Patient underwent the above stated procedure on 12/17/2012. Patient tolerated the procedure well and brought to the recovery room in good condition and subsequently to the floor.  POD #1 BP: 144/81 ; Pulse: 83 ; Temp: 99.3 F (37.4 C) ; Resp: 16  Pt's foley was removed, as well as the hemovac drain removed. IV was changed to a saline lock. Patient reports pain as mild, pain controlled. No events throughout the night. Neurovascular intact, dorsiflexion/plantar flexion intact, incision: dressing C/D/I,  no cellulitis present and compartment soft.   LABS  Basename    HGB  10.4  HCT  30.3   POD #2  BP: 133/76 ; Pulse: 61 ; Temp: 98.6 F (37 C) ; Resp: 16  Patient reports pain as mild, pain controlled. Urinating better than she was yesterday. Yesterday had a little anxiety over not being able to urinate, however better today. Also had a BM without issues. No events throughout the  night. Ready to be discharged home. Neurovascular intact, dorsiflexion/plantar flexion intact, incision: dressing C/D/I, no cellulitis present and compartment soft.   LABS  Basename    HGB  9.9  HCT  28.9    Discharge Exam: General appearance: alert, cooperative and no distress Extremities: Homans sign is negative, no sign of DVT, no edema, redness or tenderness in the calves or thighs and no ulcers, gangrene or trophic changes  Disposition:   Home-Health Care Svc with follow up in 2 weeks   Follow-up Information   Follow up with [name], MD. Schedule an appointment as soon as possible for a visit in 2 weeks.   Specialty:  Orthopedic Surgery   Contact information:   [address] [place] [address] [phone]       Discharge Orders   Future Appointments Provider Department Dept Phone   01/16/2013 1:15 PM Kerri Perches, MD [facility] [phone]   Future Orders Complete By Expires   Call MD / Call 911  As directed    Comments:     If you experience chest pain or shortness of breath, CALL 911 and be transported to the hospital emergency room.  If you develope a fever above 101 F, pus (white drainage) or increased drainage or redness at the wound, or calf pain, call your surgeon's office.   Change dressing  As directed    Comments:     Maintain surgical dressing for 10-14 days, then replace with 4x4 guaze and tape. Keep the area dry and clean.   Constipation Prevention  As directed    Comments:     Drink plenty of fluids.  Prune juice may be helpful.  You may use a stool softener, such as Colace (over the counter) 100 mg twice a day.  Use MiraLax (over the counter) for constipation as needed.   Diet - low sodium heart healthy  As directed    Discharge instructions  As directed    Comments:     Maintain surgical dressing for 10-14 days, then replace with gauze and tape. Keep the area dry and clean until follow up. Follow up in 2  weeks at [facility]. Call with any questions or concerns.   Increase activity slowly as tolerated  As directed    TED hose  As directed    Comments:     Use stockings (TED hose) for 2 weeks on both leg(s).  You may remove them at night for sleeping.   Weight bearing as tolerated  As directed    Questions:     Laterality:     Extremity:          Medication List    STOP taking these medications       aspirin 81 MG tablet  Replaced by:  aspirin 325 MG EC tablet     hydroxychloroquine 200 MG tablet  Commonly known as:  PLAQUENIL      TAKE these medications       acyclovir 800 MG tablet  Commonly  known as:  ZOVIRAX  Take 800 mg by mouth 4 (four) times daily as needed (breakouts). For breakouts     aspirin 325 MG EC tablet  Take 1 tablet (325 mg total) by mouth 2 (two) times daily.     busPIRone 5 MG tablet  Commonly known as:  BUSPAR  Take 1 tablet (5 mg total) by mouth 3 (three) times daily.     citalopram 20 MG tablet  Commonly known as:  CELEXA  Take 20 mg by mouth daily.     cloNIDine 0.3 MG tablet  Commonly known as:  CATAPRES  Take 0.3 mg by mouth 2 (two) times daily.     DSS 100 MG Caps  Take 100 mg by mouth 2 (two) times daily.     DURAGESIC 25 MCG/HR patch  Generic drug:  fentaNYL  Place 1 patch (25 mcg total) onto the skin every 3 (three) days.     fentaNYL 50 MCG/HR  Commonly known as:  DURAGESIC - dosed mcg/hr  Place 1 patch onto the skin every 3 (three) days.     EPINEPHrine 0.3 mg/0.3 mL Soaj injection  Commonly known as:  EPI-PEN  Inject 0.3 mLs (0.3 mg total) into the muscle once.     ergocalciferol 50000 UNITS capsule  Commonly known as:  VITAMIN D2  Take 50,000 Units by mouth every 30 (thirty) days. One capsule once weekly     ferrous sulfate 325 (65 FE) MG tablet  Take 1 tablet (325 mg total) by mouth 3 (three) times daily after meals.     gabapentin 300 MG capsule  Commonly known as:  NEURONTIN  Take 300 mg by mouth 3  (three) times daily as needed (pain). For breakouts     HYDROmorphone 4 MG tablet  Commonly known as:  DILAUDID  Take 1 tablet (4 mg total) by mouth every 4 (four) hours as needed for severe pain.     Krill Oil Caps  Take 2 capsules by mouth daily.     multivitamins ther. w/minerals Tabs tablet  Take 1 tablet by mouth daily.     omeprazole 40 MG capsule  Commonly known as:  PRILOSEC  Take 40 mg by mouth daily.     polyethylene glycol packet  Commonly known as:  MIRALAX / GLYCOLAX  Take 17 g by mouth 2 (two) times daily.     temazepam 30 MG capsule  Commonly known as:  RESTORIL  Take 30 mg by mouth at bedtime as needed for sleep.     tiZANidine 4 MG tablet  Commonly known as:  ZANAFLEX  Take 1 tablet (4 mg total) by mouth every 6 (six) hours as needed for muscle spasms.         Signed: Anastasio Auerbach. Paulmichael Schreck   PAC  12/21/2012, 2:29 PM

## 2012-12-24 DIAGNOSIS — Z471 Aftercare following joint replacement surgery: Secondary | ICD-10-CM | POA: Diagnosis not present

## 2012-12-24 DIAGNOSIS — M329 Systemic lupus erythematosus, unspecified: Secondary | ICD-10-CM | POA: Diagnosis not present

## 2012-12-24 DIAGNOSIS — IMO0001 Reserved for inherently not codable concepts without codable children: Secondary | ICD-10-CM | POA: Diagnosis not present

## 2012-12-24 DIAGNOSIS — Z96649 Presence of unspecified artificial hip joint: Secondary | ICD-10-CM | POA: Diagnosis not present

## 2012-12-24 DIAGNOSIS — M159 Polyosteoarthritis, unspecified: Secondary | ICD-10-CM | POA: Diagnosis not present

## 2012-12-26 DIAGNOSIS — M159 Polyosteoarthritis, unspecified: Secondary | ICD-10-CM | POA: Diagnosis not present

## 2012-12-26 DIAGNOSIS — Z471 Aftercare following joint replacement surgery: Secondary | ICD-10-CM | POA: Diagnosis not present

## 2012-12-26 DIAGNOSIS — IMO0001 Reserved for inherently not codable concepts without codable children: Secondary | ICD-10-CM | POA: Diagnosis not present

## 2012-12-26 DIAGNOSIS — Z96649 Presence of unspecified artificial hip joint: Secondary | ICD-10-CM | POA: Diagnosis not present

## 2012-12-26 DIAGNOSIS — M329 Systemic lupus erythematosus, unspecified: Secondary | ICD-10-CM | POA: Diagnosis not present

## 2012-12-27 DIAGNOSIS — M159 Polyosteoarthritis, unspecified: Secondary | ICD-10-CM | POA: Diagnosis not present

## 2012-12-27 DIAGNOSIS — Z471 Aftercare following joint replacement surgery: Secondary | ICD-10-CM | POA: Diagnosis not present

## 2012-12-27 DIAGNOSIS — M329 Systemic lupus erythematosus, unspecified: Secondary | ICD-10-CM | POA: Diagnosis not present

## 2012-12-27 DIAGNOSIS — Z96649 Presence of unspecified artificial hip joint: Secondary | ICD-10-CM | POA: Diagnosis not present

## 2012-12-27 DIAGNOSIS — IMO0001 Reserved for inherently not codable concepts without codable children: Secondary | ICD-10-CM | POA: Diagnosis not present

## 2012-12-31 DIAGNOSIS — IMO0001 Reserved for inherently not codable concepts without codable children: Secondary | ICD-10-CM | POA: Diagnosis not present

## 2012-12-31 DIAGNOSIS — M159 Polyosteoarthritis, unspecified: Secondary | ICD-10-CM | POA: Diagnosis not present

## 2012-12-31 DIAGNOSIS — M329 Systemic lupus erythematosus, unspecified: Secondary | ICD-10-CM | POA: Diagnosis not present

## 2012-12-31 DIAGNOSIS — Z96649 Presence of unspecified artificial hip joint: Secondary | ICD-10-CM | POA: Diagnosis not present

## 2012-12-31 DIAGNOSIS — Z471 Aftercare following joint replacement surgery: Secondary | ICD-10-CM | POA: Diagnosis not present

## 2013-01-01 DIAGNOSIS — M159 Polyosteoarthritis, unspecified: Secondary | ICD-10-CM | POA: Diagnosis not present

## 2013-01-01 DIAGNOSIS — Z471 Aftercare following joint replacement surgery: Secondary | ICD-10-CM | POA: Diagnosis not present

## 2013-01-01 DIAGNOSIS — Z96649 Presence of unspecified artificial hip joint: Secondary | ICD-10-CM | POA: Diagnosis not present

## 2013-01-01 DIAGNOSIS — M329 Systemic lupus erythematosus, unspecified: Secondary | ICD-10-CM | POA: Diagnosis not present

## 2013-01-01 DIAGNOSIS — IMO0001 Reserved for inherently not codable concepts without codable children: Secondary | ICD-10-CM | POA: Diagnosis not present

## 2013-01-04 DIAGNOSIS — M329 Systemic lupus erythematosus, unspecified: Secondary | ICD-10-CM | POA: Diagnosis not present

## 2013-01-04 DIAGNOSIS — IMO0001 Reserved for inherently not codable concepts without codable children: Secondary | ICD-10-CM | POA: Diagnosis not present

## 2013-01-04 DIAGNOSIS — M159 Polyosteoarthritis, unspecified: Secondary | ICD-10-CM | POA: Diagnosis not present

## 2013-01-04 DIAGNOSIS — Z96649 Presence of unspecified artificial hip joint: Secondary | ICD-10-CM | POA: Diagnosis not present

## 2013-01-04 DIAGNOSIS — Z471 Aftercare following joint replacement surgery: Secondary | ICD-10-CM | POA: Diagnosis not present

## 2013-01-16 ENCOUNTER — Ambulatory Visit (INDEPENDENT_AMBULATORY_CARE_PROVIDER_SITE_OTHER): Payer: Medicare Other | Admitting: Family Medicine

## 2013-01-16 ENCOUNTER — Encounter: Payer: Self-pay | Admitting: Family Medicine

## 2013-01-16 ENCOUNTER — Encounter (INDEPENDENT_AMBULATORY_CARE_PROVIDER_SITE_OTHER): Payer: Self-pay

## 2013-01-16 VITALS — BP 120/62 | HR 78 | Resp 18 | Ht 65.0 in | Wt 135.1 lb

## 2013-01-16 DIAGNOSIS — R5381 Other malaise: Secondary | ICD-10-CM

## 2013-01-16 DIAGNOSIS — F3289 Other specified depressive episodes: Secondary | ICD-10-CM

## 2013-01-16 DIAGNOSIS — M549 Dorsalgia, unspecified: Secondary | ICD-10-CM

## 2013-01-16 DIAGNOSIS — D509 Iron deficiency anemia, unspecified: Secondary | ICD-10-CM | POA: Diagnosis not present

## 2013-01-16 DIAGNOSIS — R3912 Poor urinary stream: Secondary | ICD-10-CM

## 2013-01-16 DIAGNOSIS — M329 Systemic lupus erythematosus, unspecified: Secondary | ICD-10-CM

## 2013-01-16 DIAGNOSIS — Z1239 Encounter for other screening for malignant neoplasm of breast: Secondary | ICD-10-CM | POA: Diagnosis not present

## 2013-01-16 DIAGNOSIS — E559 Vitamin D deficiency, unspecified: Secondary | ICD-10-CM

## 2013-01-16 DIAGNOSIS — A6000 Herpesviral infection of urogenital system, unspecified: Secondary | ICD-10-CM

## 2013-01-16 DIAGNOSIS — I10 Essential (primary) hypertension: Secondary | ICD-10-CM | POA: Diagnosis not present

## 2013-01-16 DIAGNOSIS — F411 Generalized anxiety disorder: Secondary | ICD-10-CM

## 2013-01-16 DIAGNOSIS — F329 Major depressive disorder, single episode, unspecified: Secondary | ICD-10-CM

## 2013-01-16 DIAGNOSIS — R39198 Other difficulties with micturition: Secondary | ICD-10-CM

## 2013-01-16 DIAGNOSIS — Z13820 Encounter for screening for osteoporosis: Secondary | ICD-10-CM

## 2013-01-16 DIAGNOSIS — K219 Gastro-esophageal reflux disease without esophagitis: Secondary | ICD-10-CM

## 2013-01-16 MED ORDER — FERROUS SULFATE 325 (65 FE) MG PO TBEC: 325.0000 mg | DELAYED_RELEASE_TABLET | Freq: Three times a day (TID) | ORAL | Status: DC

## 2013-01-16 NOTE — Progress Notes (Signed)
   Subjective:    Patient ID: Renee Waters, female    DOB: 13-Feb-1945, 67 y.o.   MRN: [id number]  HPI The PT is here for follow up and re-evaluation of chronic medical conditions, medication management and review of any available recent lab and radiology data.  Preventive health is updated, specifically  Cancer screening and Immunization.   Recent total right hip revision has been entirely successful, pt ambulating without pain and without dependence on a cane. Looks years younger due to pain relief. Continue to follow with rheumatology also The PT denies any adverse reactions to current medications since the last visit.  There are no new concerns.  There are no specific complaints       Review of Systems See HPI Denies recent fever or chills. Denies sinus pressure, nasal congestion, ear pain or sore throat. Denies chest congestion, productive cough or wheezing. Denies chest pains, palpitations and leg swelling Denies abdominal pain, nausea, vomiting,diarrhea or constipation.   Denies dysuria, frequency,  or incontinence.Is experiencing increased difficulty with voiding Denies uncontrolled  joint pain, swelling and limitation in mobility. Denies headaches, seizures, numbness, or tingling. Denies depression, anxiety or insomnia. Denies skin break down or rash.        Objective:   Physical Exam  Patient alert and oriented and in no cardiopulmonary distress.  HEENT: No facial asymmetry, EOMI, no sinus tenderness,  oropharynx pink and moist.  Neck supple no adenopathy.  Chest: Clear to auscultation bilaterally.  CVS: S1, S2 no murmurs, no S3.  ABD: Soft non tender. Bowel sounds normal.  Ext: No edema  MS: Adequate ROM spine, shoulders, hips and knees.  Skin: Intact, no ulcerations or rash noted.  Psych: Good eye contact, normal affect. Memory intact not anxious or depressed appearing.  CNS: CN 2-12 intact, power, tone and sensation normal throughout.         Assessment & Plan:

## 2013-01-16 NOTE — Patient Instructions (Addendum)
Pelvic and breast exam last of January or early Feb, call if you need me before  I am SO THANKFUL that your surgery has been a success.  Continue the iron 3 times daily please.  You will get the script to take with you, as discussed this is also OTC, so speak with the pharmacist  Mammogram due end January and bone density scan is due , both will be scheduled for you   Non fasting CBC, iron , ferritin chem 7 and TSH about 5 days before next visit  You are referred to urologist

## 2013-01-20 ENCOUNTER — Encounter: Payer: Self-pay | Admitting: Family Medicine

## 2013-01-20 NOTE — Assessment & Plan Note (Signed)
Pt to continue to follow with rheumatology

## 2013-01-20 NOTE — Assessment & Plan Note (Signed)
Pt to continue supplements and updated lab in January

## 2013-01-20 NOTE — Assessment & Plan Note (Signed)
Controlled, no change in medication  

## 2013-01-20 NOTE — Assessment & Plan Note (Signed)
No recent , med only for use during flares

## 2013-01-20 NOTE — Assessment & Plan Note (Signed)
Weekly vit D for 6 monht at least

## 2013-01-20 NOTE — Assessment & Plan Note (Signed)
Improved no pain, daughter who she remains concerned is in a stable living environ at this time

## 2013-01-20 NOTE — Assessment & Plan Note (Signed)
Progressive deterioration in ability to void urine , urology to eval and treat

## 2013-01-20 NOTE — Assessment & Plan Note (Signed)
Improved with recent hip surgery, pain management through pain clinic

## 2013-01-20 NOTE — Assessment & Plan Note (Signed)
Controlled, no change in medication DASH diet and commitment to daily physical activity for a minimum of 30 minutes discussed and encouraged, as a part of hypertension management. The importance of attaining a healthy weight is also discussed.  

## 2013-01-28 DIAGNOSIS — Z79899 Other long term (current) drug therapy: Secondary | ICD-10-CM | POA: Diagnosis not present

## 2013-01-28 DIAGNOSIS — M1990 Unspecified osteoarthritis, unspecified site: Secondary | ICD-10-CM | POA: Diagnosis not present

## 2013-01-28 DIAGNOSIS — Z96649 Presence of unspecified artificial hip joint: Secondary | ICD-10-CM | POA: Diagnosis not present

## 2013-02-06 DIAGNOSIS — M2480 Other specific joint derangements of unspecified joint, not elsewhere classified: Secondary | ICD-10-CM | POA: Diagnosis not present

## 2013-02-06 DIAGNOSIS — Z96649 Presence of unspecified artificial hip joint: Secondary | ICD-10-CM | POA: Diagnosis not present

## 2013-02-25 ENCOUNTER — Other Ambulatory Visit: Payer: Self-pay | Admitting: Family Medicine

## 2013-02-25 DIAGNOSIS — Z139 Encounter for screening, unspecified: Secondary | ICD-10-CM

## 2013-03-07 ENCOUNTER — Ambulatory Visit (HOSPITAL_COMMUNITY): Payer: Medicare Other

## 2013-03-07 ENCOUNTER — Other Ambulatory Visit: Payer: Self-pay | Admitting: Family Medicine

## 2013-03-08 ENCOUNTER — Ambulatory Visit (HOSPITAL_COMMUNITY): Payer: Medicare Other

## 2013-03-08 ENCOUNTER — Ambulatory Visit (HOSPITAL_COMMUNITY)
Admission: RE | Admit: 2013-03-08 | Discharge: 2013-03-08 | Disposition: A | Discharge status: Home / Self Care | Payer: Medicare Other | Source: Ambulatory Visit | Attending: Family Medicine | Admitting: Family Medicine

## 2013-03-08 DIAGNOSIS — M899 Disorder of bone, unspecified: Secondary | ICD-10-CM | POA: Insufficient documentation

## 2013-03-08 DIAGNOSIS — Z13820 Encounter for screening for osteoporosis: Secondary | ICD-10-CM

## 2013-03-08 DIAGNOSIS — M949 Disorder of cartilage, unspecified: Secondary | ICD-10-CM | POA: Diagnosis not present

## 2013-03-08 DIAGNOSIS — Z780 Asymptomatic menopausal state: Secondary | ICD-10-CM | POA: Insufficient documentation

## 2013-03-08 DIAGNOSIS — Z1239 Encounter for other screening for malignant neoplasm of breast: Secondary | ICD-10-CM

## 2013-03-08 DIAGNOSIS — Z1231 Encounter for screening mammogram for malignant neoplasm of breast: Secondary | ICD-10-CM | POA: Insufficient documentation

## 2013-03-08 DIAGNOSIS — E559 Vitamin D deficiency, unspecified: Secondary | ICD-10-CM | POA: Insufficient documentation

## 2013-03-14 ENCOUNTER — Other Ambulatory Visit: Payer: Self-pay

## 2013-03-14 ENCOUNTER — Telehealth: Payer: Self-pay | Admitting: *Deleted

## 2013-03-14 MED ORDER — ALPRAZOLAM 0.5 MG PO TABS: 0.5000 mg | ORAL_TABLET | Freq: Two times a day (BID) | ORAL | Status: DC | PRN

## 2013-03-14 NOTE — Telephone Encounter (Signed)
Patient states she has been very anxious and unable to calm down. Was crying when I spoke with her. States she is going through some things and wants enough xanax to get her through the weekend. She has appt Monday. States her xanax was changed to buspar a month ago but its not helping with her nerves. Please advise

## 2013-03-14 NOTE — Telephone Encounter (Signed)
Pt wants to know if she can get nanax pt said she needs to get through the weekend pt is very upset, she has a appointment Monday. Please advise [phone]

## 2013-03-14 NOTE — Telephone Encounter (Signed)
pls call in or send electronically if possible xanax 0.5mg  one twice daily as needed #12 only

## 2013-03-14 NOTE — Telephone Encounter (Signed)
Med sent and patient aware 

## 2013-03-18 ENCOUNTER — Other Ambulatory Visit (HOSPITAL_COMMUNITY)
Admission: RE | Admit: 2013-03-18 | Discharge: 2013-03-18 | Disposition: A | Discharge status: Home / Self Care | Payer: Medicare Other | Source: Ambulatory Visit | Attending: Family Medicine | Admitting: Family Medicine

## 2013-03-18 ENCOUNTER — Ambulatory Visit (INDEPENDENT_AMBULATORY_CARE_PROVIDER_SITE_OTHER): Payer: Medicare Other | Admitting: Family Medicine

## 2013-03-18 ENCOUNTER — Encounter (INDEPENDENT_AMBULATORY_CARE_PROVIDER_SITE_OTHER): Payer: Self-pay

## 2013-03-18 VITALS — BP 120/70 | HR 72 | Resp 16 | Wt 125.0 lb

## 2013-03-18 DIAGNOSIS — Z124 Encounter for screening for malignant neoplasm of cervix: Secondary | ICD-10-CM

## 2013-03-18 DIAGNOSIS — M549 Dorsalgia, unspecified: Secondary | ICD-10-CM | POA: Diagnosis not present

## 2013-03-18 DIAGNOSIS — I10 Essential (primary) hypertension: Secondary | ICD-10-CM | POA: Diagnosis not present

## 2013-03-18 DIAGNOSIS — Z1239 Encounter for other screening for malignant neoplasm of breast: Secondary | ICD-10-CM | POA: Diagnosis not present

## 2013-03-18 DIAGNOSIS — Z1272 Encounter for screening for malignant neoplasm of vagina: Secondary | ICD-10-CM

## 2013-03-18 DIAGNOSIS — F3289 Other specified depressive episodes: Secondary | ICD-10-CM | POA: Diagnosis not present

## 2013-03-18 DIAGNOSIS — F329 Major depressive disorder, single episode, unspecified: Secondary | ICD-10-CM | POA: Diagnosis not present

## 2013-03-18 DIAGNOSIS — Z0000 Encounter for general adult medical examination without abnormal findings: Secondary | ICD-10-CM

## 2013-03-18 DIAGNOSIS — Z01419 Encounter for gynecological examination (general) (routine) without abnormal findings: Secondary | ICD-10-CM | POA: Insufficient documentation

## 2013-03-18 DIAGNOSIS — E559 Vitamin D deficiency, unspecified: Secondary | ICD-10-CM

## 2013-03-18 MED ORDER — CITALOPRAM HYDROBROMIDE 20 MG PO TABS: ORAL_TABLET | ORAL | Status: DC

## 2013-03-18 MED ORDER — CLONIDINE HCL 0.3 MG PO TABS: 0.3000 mg | ORAL_TABLET | Freq: Two times a day (BID) | ORAL | Status: DC

## 2013-03-18 NOTE — Patient Instructions (Signed)
F/U in 4 months, call if you need me before  You are referred for pain management  It is important that you exercise regularly at least 30 minutes 5 times a week. If you develop chest pain, have severe difficulty breathing, or feel very tired, stop exercising immediately and seek medical attention    A healthy diet is rich in fruit, vegetables and whole grains. Poultry fish, nuts and beans are a healthy choice for protein rather then red meat. A low sodium diet and drinking 64 ounces of water daily is generally recommended. Oils and sweet should be limited. Carbohydrates especially for those who are diabetic or overweight, should be limited to 45 to 60 gram per meal. It is important to eat on a regular schedule, at least 3 times daily. Snacks should be primarily fruits, vegetables or nuts.  Adequate rest, generally 6 to 8 hours per night is important for good health.Good sleep hygiene involves setting a regular bedtime, and turning off all sound and light in your sleep environment.Limiting caffeine intake will also help with the ability to rest well  Safe storage of firearms, if you have them, and regular use of seat belts is important  Lab work needs to be done as soon as possible  All medications need to be brought to every visit

## 2013-03-21 ENCOUNTER — Other Ambulatory Visit: Payer: Self-pay | Admitting: Family Medicine

## 2013-03-21 ENCOUNTER — Telehealth: Payer: Self-pay | Admitting: *Deleted

## 2013-03-21 DIAGNOSIS — R7301 Impaired fasting glucose: Secondary | ICD-10-CM | POA: Diagnosis not present

## 2013-03-21 DIAGNOSIS — I10 Essential (primary) hypertension: Secondary | ICD-10-CM | POA: Diagnosis not present

## 2013-03-21 DIAGNOSIS — E559 Vitamin D deficiency, unspecified: Secondary | ICD-10-CM | POA: Diagnosis not present

## 2013-03-21 DIAGNOSIS — Z0000 Encounter for general adult medical examination without abnormal findings: Secondary | ICD-10-CM | POA: Diagnosis not present

## 2013-03-21 LAB — BASIC METABOLIC PANEL
BUN: 15 mg/dL (ref 6–23)
CALCIUM: 9.5 mg/dL (ref 8.4–10.5)
CO2: 22 mEq/L (ref 19–32)
Chloride: 107 mEq/L (ref 96–112)
Creat: 0.82 mg/dL (ref 0.50–1.10)
Glucose, Bld: 118 mg/dL — ABNORMAL HIGH (ref 70–99)
POTASSIUM: 4.1 meq/L (ref 3.5–5.3)
SODIUM: 141 meq/L (ref 135–145)

## 2013-03-21 LAB — LIPID PANEL
Cholesterol: 137 mg/dL (ref 0–200)
HDL: 46 mg/dL (ref >39)
LDL CALC: 76 mg/dL (ref 0–99)
Total CHOL/HDL Ratio: 3 Ratio
Triglycerides: 73 mg/dL (ref <150)
VLDL: 15 mg/dL (ref 0–40)

## 2013-03-21 LAB — CBC
HCT: 43.6 % (ref 36.0–46.0)
Hemoglobin: 14.3 g/dL (ref 12.0–15.0)
MCH: 30.6 pg (ref 26.0–34.0)
MCHC: 32.8 g/dL (ref 30.0–36.0)
MCV: 93.4 fL (ref 78.0–100.0)
PLATELETS: 153 10*3/uL (ref 150–400)
RBC: 4.67 MIL/uL (ref 3.87–5.11)
RDW: 13.7 % (ref 11.5–15.5)
WBC: 4.2 10*3/uL (ref 4.0–10.5)

## 2013-03-21 LAB — TSH: TSH: 1.687 u[IU]/mL (ref 0.350–4.500)

## 2013-03-21 NOTE — Telephone Encounter (Signed)
Pt called saying the pain management doctor can't see her for 4 months, pt is taking the  tylenol and gabapentin and they are not helping. Please advise

## 2013-03-21 NOTE — Telephone Encounter (Signed)
Patient aware and she has tried to get a sooner appt but can't. Renee Waters will call and try to get it sooner

## 2013-03-21 NOTE — Telephone Encounter (Signed)
She has been established with Dr Francesco Runner for pain management, she was referred there because of uncontrolled pain, she is already his patient , i suggest she calls AGAIN FOR A SOONER APPT WITH HIM SINCE THE MED HE HAS HER ON NOW IS NOT EFFECTIVE

## 2013-03-22 LAB — HEMOGLOBIN A1C
HEMOGLOBIN A1C: 5.9 % — AB (ref <5.7)
MEAN PLASMA GLUCOSE: 123 mg/dL — AB (ref <117)

## 2013-03-22 LAB — VITAMIN D 25 HYDROXY (VIT D DEFICIENCY, FRACTURES): Vit D, 25-Hydroxy: 32 ng/mL (ref 30–89)

## 2013-03-26 ENCOUNTER — Encounter: Payer: Self-pay | Admitting: Family Medicine

## 2013-03-26 DIAGNOSIS — Z0000 Encounter for general adult medical examination without abnormal findings: Secondary | ICD-10-CM | POA: Insufficient documentation

## 2013-03-26 NOTE — Progress Notes (Signed)
   Subjective:    Patient ID: Renee Waters, female    DOB: 19-Feb-1945, 68 y.o.   MRN: [id number]  HPI The patient is for an annual exam. Health maintenance is reviewed and updated, specifically  recommended,  screening tests and immunizations. Recent lab and radiologic data is reviewed also.    Review of Systems See HPI Denies recent fever or chills. Denies sinus pressure, nasal congestion, ear pain or sore throat. Denies chest congestion, productive cough or wheezing. Denies chest pains, palpitations and leg swelling Denies abdominal pain, nausea, vomiting,diarrhea or constipation.   Denies dysuria, frequency, hesitancy or incontinence. C/o increased and uncontrolled back pain radiating to right groin, states pain medication she is getting from her pain specialist is not working, and next appt is not until April Denies headaches, seizures, numbness, or tingling. C/o depression, anxiety and  Insomnia due to a combination of uncontrolled pain and also relationships with her husband Denies skin break down or rash.        Objective:   Physical Exam  BP 120/70  Pulse 72  Resp 16  Wt 125 lb (56.7 kg)  SpO2 98%   Pleasant well nourished female, alert and oriented x 3, in no cardio-pulmonary distress.Patient in pain Afebrile. HEENT No facial trauma or asymetry. Sinuses non tender.  EOMI, PERTL, fundoscopic exam is normal, no hemorhage or exudate.  External ears normal, tympanic membranes clear. Oropharynx moist, no exudate, good dentition. Neck: supple, no adenopathy,JVD or thyromegaly.No bruits.  Chest: Clear to ascultation bilaterally.No crackles or wheezes. Non tender to palpation  Breast: No asymetry,no masses. No nipple discharge or inversion. No axillary or supraclavicular adenopathy  Cardiovascular system; Heart sounds normal,  S1 and  S2 ,no S3.  No murmur, or thrill. Apical beat not displaced Peripheral pulses normal.  Abdomen: Soft, non tender, no  organomegaly or masses. No bruits. Bowel sounds normal. No guarding, tenderness or rebound.  Rectal:  No mass. Guaiac negative stool.  GU: External genitalia normal. No lesions. Vaginal canal  Dry and atrophic. Uterus absent, no adnexal masses, no  adnexal tenderness.  Musculoskeletal exam: Decreased  ROM of spine, hips ,  and knees. No deformity ,swelling or crepitus noted. No muscle wasting or atrophy.   Neurologic: Cranial nerves 2 to 12 intact. Power, tone ,sensation and reflexes normal throughout. No disturbance in gait. No tremor.  Skin: Intact, no ulceration, erythema , scaling or rash noted. Pigmentation normal throughout  Psych; Normal mood and affect. Judgement and concentration normal       Assessment & Plan:  Routine general medical examination at a health care facility Annual exam as documented. Counseling done  re healthy lifestyle involving commitment to 150 minutes exercise per week, heart healthy diet, and attaining healthy weight.The importance of adequate sleep also discussed. Regular seat belt use and safe storage  of firearms if patient has them, is also discussed. Changes in health habits are decided on by the patient with goals and time frames  set for achieving them. Immunization and cancer screening needs are specifically addressed at this visit.   BACK PAIN, CHRONIC Uncontrolled, pt reports being out of pain medication despite being treated in a pain clinic, I have advised that she call the Provider to discuss her uncontrolled pain situation, and entered a new referral also

## 2013-03-26 NOTE — Assessment & Plan Note (Signed)
Annual exam as documented. Counseling done  re healthy lifestyle involving commitment to 150 minutes exercise per week, heart healthy diet, and attaining healthy weight.The importance of adequate sleep also discussed. Regular seat belt use and safe storage  of firearms if patient has them, is also discussed. Changes in health habits are decided on by the patient with goals and time frames  set for achieving them. Immunization and cancer screening needs are specifically addressed at this visit.  

## 2013-03-26 NOTE — Assessment & Plan Note (Signed)
Uncontrolled, pt reports being out of pain medication despite being treated in a pain clinic, I have advised that she call the Provider to discuss her uncontrolled pain situation, and entered a new referral also

## 2013-04-08 ENCOUNTER — Other Ambulatory Visit: Payer: Self-pay | Admitting: Family Medicine

## 2013-04-12 DIAGNOSIS — M2480 Other specific joint derangements of unspecified joint, not elsewhere classified: Secondary | ICD-10-CM | POA: Diagnosis not present

## 2013-04-12 DIAGNOSIS — M24859 Other specific joint derangements of unspecified hip, not elsewhere classified: Secondary | ICD-10-CM | POA: Diagnosis not present

## 2013-04-15 ENCOUNTER — Other Ambulatory Visit: Payer: Self-pay | Admitting: Family Medicine

## 2013-04-23 ENCOUNTER — Telehealth: Payer: Self-pay | Admitting: Family Medicine

## 2013-04-23 NOTE — Telephone Encounter (Signed)
Med resent to [place]

## 2013-05-23 ENCOUNTER — Telehealth: Payer: Self-pay | Admitting: Family Medicine

## 2013-05-23 MED ORDER — ACYCLOVIR 800 MG PO TABS: 800.0000 mg | ORAL_TABLET | Freq: Four times a day (QID) | ORAL | Status: DC | PRN

## 2013-05-23 NOTE — Telephone Encounter (Signed)
Refilled

## 2013-06-18 DIAGNOSIS — Z79899 Other long term (current) drug therapy: Secondary | ICD-10-CM | POA: Diagnosis not present

## 2013-06-26 DIAGNOSIS — M47817 Spondylosis without myelopathy or radiculopathy, lumbosacral region: Secondary | ICD-10-CM | POA: Diagnosis not present

## 2013-06-26 DIAGNOSIS — M76899 Other specified enthesopathies of unspecified lower limb, excluding foot: Secondary | ICD-10-CM | POA: Diagnosis not present

## 2013-06-26 DIAGNOSIS — M1990 Unspecified osteoarthritis, unspecified site: Secondary | ICD-10-CM | POA: Diagnosis not present

## 2013-06-26 DIAGNOSIS — Z79899 Other long term (current) drug therapy: Secondary | ICD-10-CM | POA: Diagnosis not present

## 2013-07-03 DIAGNOSIS — Z9660 Presence of unspecified orthopedic joint implant: Secondary | ICD-10-CM | POA: Diagnosis not present

## 2013-07-16 ENCOUNTER — Ambulatory Visit (INDEPENDENT_AMBULATORY_CARE_PROVIDER_SITE_OTHER): Payer: Medicare Other | Admitting: Family Medicine

## 2013-07-16 ENCOUNTER — Encounter (INDEPENDENT_AMBULATORY_CARE_PROVIDER_SITE_OTHER): Payer: Self-pay

## 2013-07-16 ENCOUNTER — Encounter: Payer: Self-pay | Admitting: Family Medicine

## 2013-07-16 ENCOUNTER — Other Ambulatory Visit: Payer: Self-pay

## 2013-07-16 VITALS — BP 130/84 | HR 65 | Resp 16 | Wt 129.0 lb

## 2013-07-16 DIAGNOSIS — R7301 Impaired fasting glucose: Secondary | ICD-10-CM

## 2013-07-16 DIAGNOSIS — F3289 Other specified depressive episodes: Secondary | ICD-10-CM

## 2013-07-16 DIAGNOSIS — F329 Major depressive disorder, single episode, unspecified: Secondary | ICD-10-CM | POA: Diagnosis not present

## 2013-07-16 DIAGNOSIS — G4700 Insomnia, unspecified: Secondary | ICD-10-CM

## 2013-07-16 DIAGNOSIS — M549 Dorsalgia, unspecified: Secondary | ICD-10-CM | POA: Diagnosis not present

## 2013-07-16 DIAGNOSIS — A6000 Herpesviral infection of urogenital system, unspecified: Secondary | ICD-10-CM

## 2013-07-16 DIAGNOSIS — I10 Essential (primary) hypertension: Secondary | ICD-10-CM

## 2013-07-16 DIAGNOSIS — M329 Systemic lupus erythematosus, unspecified: Secondary | ICD-10-CM

## 2013-07-16 DIAGNOSIS — F411 Generalized anxiety disorder: Secondary | ICD-10-CM

## 2013-07-16 MED ORDER — BUSPIRONE HCL 5 MG PO TABS: ORAL_TABLET | ORAL | Status: DC

## 2013-07-16 MED ORDER — CLONIDINE HCL 0.3 MG PO TABS: 0.3000 mg | ORAL_TABLET | Freq: Two times a day (BID) | ORAL | Status: DC

## 2013-07-16 MED ORDER — TEMAZEPAM 30 MG PO CAPS: ORAL_CAPSULE | ORAL | Status: DC

## 2013-07-16 NOTE — Patient Instructions (Signed)
Annual wellness exam in 3.5 month, call if you need me before   HBA1C and chem 7 today  For anxiety and help with sleep please start taking TWO buspar tablets at bedtime , and continue the one in the morning  I am thankful that you are doing better overall, please  Get the help we discussed for home situation

## 2013-07-17 DIAGNOSIS — R7301 Impaired fasting glucose: Secondary | ICD-10-CM | POA: Diagnosis not present

## 2013-07-17 LAB — BASIC METABOLIC PANEL
BUN: 22 mg/dL (ref 6–23)
CALCIUM: 8.9 mg/dL (ref 8.4–10.5)
CO2: 25 meq/L (ref 19–32)
CREATININE: 0.92 mg/dL (ref 0.50–1.10)
Chloride: 106 mEq/L (ref 96–112)
GLUCOSE: 108 mg/dL — AB (ref 70–99)
Potassium: 4.1 mEq/L (ref 3.5–5.3)
SODIUM: 138 meq/L (ref 135–145)

## 2013-07-18 LAB — HEMOGLOBIN A1C
Hgb A1c MFr Bld: 5.7 % — ABNORMAL HIGH (ref <5.7)
MEAN PLASMA GLUCOSE: 117 mg/dL — AB (ref <117)

## 2013-07-21 ENCOUNTER — Telehealth: Payer: Self-pay | Admitting: Family Medicine

## 2013-07-21 DIAGNOSIS — M329 Systemic lupus erythematosus, unspecified: Secondary | ICD-10-CM

## 2013-07-21 DIAGNOSIS — R7301 Impaired fasting glucose: Secondary | ICD-10-CM | POA: Insufficient documentation

## 2013-07-21 NOTE — Assessment & Plan Note (Signed)
Inadequate control, has not been taking buspar as prescribed, will start to do so Ongoing stress at home, spouse reportedly very controlling, states her children no longer visit her at home due to his behavior, irritable

## 2013-07-21 NOTE — Assessment & Plan Note (Signed)
Chronic suppression maintained with daily zovirax

## 2013-07-21 NOTE — Assessment & Plan Note (Signed)
Sleep hygiene reviewed and written information offered also. Prescription sent for  medication needed.  

## 2013-07-21 NOTE — Assessment & Plan Note (Signed)
Controlled, no change in medication  

## 2013-07-21 NOTE — Assessment & Plan Note (Signed)
Controlled, no change in medication DASH diet and commitment to daily physical activity for a minimum of 30 minutes discussed and encouraged, as a part of hypertension management. The importance of attaining a healthy weight is also discussed.  

## 2013-07-21 NOTE — Progress Notes (Signed)
   Subjective:    Patient ID: Renee Waters, female    DOB: 12-Feb-1945, 68 y.o.   MRN: [id number]  HPI The PT is here for follow up and re-evaluation of chronic medical conditions, medication management and review of any available recent lab and radiology data.  Preventive health is updated, specifically  Cancer screening and Immunization.   Questions or concerns regarding consultations or procedures which the PT has had in the interim are  Addressed.Pleased with pain management, need to f/u on rheumatology management The PT denies any adverse reactions to current medications since the last visit. Has not been compliant with buspar still has increased anxiety , we are weaning off xanax, she is willing to work with the buspar There are no new concerns.  C/o increased home stress , spouse seems to be very irritable and controlling per pt report    Review of Systems See HPI Denies recent fever or chills. Denies sinus pressure, nasal congestion, ear pain or sore throat. Denies chest congestion, productive cough or wheezing. Denies chest pains, palpitations and leg swelling Denies abdominal pain, nausea, vomiting,diarrhea or constipation.   Denies dysuria, frequency, hesitancy or incontinence. DDenies headaches, seizures, numbness, or tingling. . Denies skin break down or rash.        Objective:   Physical Exam BP 130/84  Pulse 65  Resp 16  Wt 129 lb (58.514 kg)  SpO2 98% Patient alert and oriented and in no cardiopulmonary distress.  HEENT: No facial asymmetry, EOMI,   oropharynx pink and moist.  Neck adequate ROM though decreased  no JVD, no mass.  Chest: Clear to auscultation bilaterally.  CVS: S1, S2 no murmurs, no S3.  ABD: Soft non tender.   Ext: No edema  SF:[id number] though  Adequate ROM spine, shoulders, hips and knees.  Skin: Intact, no ulcerations or rash noted.  Psych: Good eye contact, normal affect. Memory intact mildly anxious not  depressed  appearing.  CNS: CN 2-12 intact, power,  normal throughout.no focal deficits noted.        Assessment & Plan:  HYPERTENSION Controlled, no change in medication DASH diet and commitment to daily physical activity for a minimum of 30 minutes discussed and encouraged, as a part of hypertension management. The importance of attaining a healthy weight is also discussed.   BACK PAIN, CHRONIC Managed by pain clinic and is controlled  DEPRESSION Controlled, no change in medication   GAD (generalized anxiety disorder) Inadequate control, has not been taking buspar as prescribed, will start to do so Ongoing stress at home, spouse reportedly very controlling, states her children no longer visit her at home due to his behavior, irritable  Systemic lupus Pt to continue care through rheumatologist  INSOMNIA Sleep hygiene reviewed and written information offered also. Prescription sent for  medication needed.   GENITAL HERPES Chronic suppression maintained with daily zovirax  IFG (impaired fasting glucose) Improved Patient educated about the importance of limiting  Carbohydrate intake , the need to commit to daily physical activity for a minimum of 30 minutes , and to commit weight loss. The fact that changes in all these areas will reduce or eliminate all together the development of diabetes is stressed.   Updated lab needed at/ before next visit.

## 2013-07-21 NOTE — Assessment & Plan Note (Signed)
Pt to continue care through rheumatologist

## 2013-07-21 NOTE — Assessment & Plan Note (Signed)
Managed by pain clinic and is controlled

## 2013-07-21 NOTE — Assessment & Plan Note (Signed)
Improved Patient educated about the importance of limiting  Carbohydrate intake , the need to commit to daily physical activity for a minimum of 30 minutes , and to commit weight loss. The fact that changes in all these areas will reduce or eliminate all together the development of diabetes is stressed.   Updated lab needed at/ before next visit.  

## 2013-07-21 NOTE — Telephone Encounter (Signed)
Pls contact pt, let her know I am just checking that she is still seeing rheumatology as far as her lupus is concerned , and that she is on medication, none brought at last visit, was on plaquinil. She needs to re establish if not seen since last August , needs to keep up with tis. Pls send me a message re her response , i would like to know

## 2013-07-22 NOTE — Telephone Encounter (Signed)
I think that it is important enough a condition that she should be treated if she has the condition or at least followed by a specialist. Pls refer her to Dr Charlestine Night for 2nd opinion , changing MD evaluation and management of lupus, I will sign, plds alos explain to referral staff what the situation iis after you enter referral

## 2013-07-22 NOTE — Addendum Note (Signed)
Addended by: Eual Fines on: 07/22/2013 03:44 PM   Modules accepted: Orders

## 2013-07-22 NOTE — Telephone Encounter (Signed)
States she is not going back to [facility] because she didn't like her and Dr Celestia Khat wasn't even sure she had lupus but put her on med anyway and it had a lot of side effects. She's not seeing anyone or taking med currently. If you want her to see someone she wants a referral to somebody else.

## 2013-07-22 NOTE — Telephone Encounter (Signed)
Referral entered and Otho Perl is aware

## 2013-07-29 ENCOUNTER — Other Ambulatory Visit: Payer: Self-pay | Admitting: Family Medicine

## 2013-07-30 DIAGNOSIS — M48061 Spinal stenosis, lumbar region without neurogenic claudication: Secondary | ICD-10-CM | POA: Diagnosis not present

## 2013-07-31 DIAGNOSIS — M48061 Spinal stenosis, lumbar region without neurogenic claudication: Secondary | ICD-10-CM | POA: Diagnosis not present

## 2013-07-31 DIAGNOSIS — M2559 Pain in other specified joint: Secondary | ICD-10-CM | POA: Diagnosis not present

## 2013-07-31 DIAGNOSIS — M1990 Unspecified osteoarthritis, unspecified site: Secondary | ICD-10-CM | POA: Diagnosis not present

## 2013-07-31 DIAGNOSIS — M5137 Other intervertebral disc degeneration, lumbosacral region: Secondary | ICD-10-CM | POA: Diagnosis not present

## 2013-07-31 DIAGNOSIS — M47817 Spondylosis without myelopathy or radiculopathy, lumbosacral region: Secondary | ICD-10-CM | POA: Diagnosis not present

## 2013-09-17 ENCOUNTER — Ambulatory Visit (INDEPENDENT_AMBULATORY_CARE_PROVIDER_SITE_OTHER): Payer: Medicare Other | Admitting: Family Medicine

## 2013-09-17 ENCOUNTER — Encounter (INDEPENDENT_AMBULATORY_CARE_PROVIDER_SITE_OTHER): Payer: Self-pay

## 2013-09-17 ENCOUNTER — Encounter: Payer: Self-pay | Admitting: Family Medicine

## 2013-09-17 VITALS — BP 142/82 | HR 70 | Resp 16 | Ht 65.0 in | Wt 127.4 lb

## 2013-09-17 DIAGNOSIS — F329 Major depressive disorder, single episode, unspecified: Secondary | ICD-10-CM | POA: Diagnosis not present

## 2013-09-17 DIAGNOSIS — R221 Localized swelling, mass and lump, neck: Secondary | ICD-10-CM | POA: Diagnosis not present

## 2013-09-17 DIAGNOSIS — F3289 Other specified depressive episodes: Secondary | ICD-10-CM | POA: Diagnosis not present

## 2013-09-17 DIAGNOSIS — R220 Localized swelling, mass and lump, head: Secondary | ICD-10-CM

## 2013-09-17 DIAGNOSIS — M329 Systemic lupus erythematosus, unspecified: Secondary | ICD-10-CM

## 2013-09-17 DIAGNOSIS — Z23 Encounter for immunization: Secondary | ICD-10-CM | POA: Diagnosis not present

## 2013-09-17 DIAGNOSIS — I10 Essential (primary) hypertension: Secondary | ICD-10-CM

## 2013-09-17 DIAGNOSIS — M549 Dorsalgia, unspecified: Secondary | ICD-10-CM

## 2013-09-17 MED ORDER — ERGOCALCIFEROL 1.25 MG (50000 UT) PO CAPS: 50000.0000 [IU] | ORAL_CAPSULE | ORAL | Status: DC

## 2013-09-17 NOTE — Patient Instructions (Signed)
F/u as before  You are referred for an [place] of your neck to evaluate the swelling you have noticed  I will work on finding another rheumatologist for you, more on that after next week please  Prevnar today  Enjoy your improved mobility and reduced pain!

## 2013-09-17 NOTE — Progress Notes (Signed)
   Subjective:    Patient ID: Renee Waters, female    DOB: March 07, 1945, 68 y.o.   MRN: [id number]  HPI  The PT is here for follow up and re-evaluation of chronic medical conditions, medication management and review of any available recent lab and radiology data.  Preventive health is updated, specifically  Cancer screening and Immunization.   Requests referral to a new rheumatologist , states "no one has ever told me I have lupus", she also has stopped taking medication  recommended as treatment The PT denies any adverse reactions to current medications since the last visit.  1 year h/o enlarging lump on left side of neck , she is concerned about this, and wants to have it checked. States pain management as far as back and hip are concerned is going well Increased anxiety and frustration, when rheumatologic health issue is brought up, and continues to deal wit family issues esp with spouse , that she finds stressful      Review of Systems See HPI Denies recent fever or chills. Denies sinus pressure, nasal congestion, ear pain or sore throat. Denies chest congestion, productive cough or wheezing. Denies chest pains, palpitations and leg swelling Denies abdominal pain, nausea, vomiting,diarrhea or constipation.   Denies dysuria, frequency, hesitancy or incontinence. Denies headaches, seizures, numbness, or tingling.  Denies skin break down or rash.        Objective:   Physical Exam BP 142/82  Pulse 70  Resp 16  Ht 5\' 5"  (1.651 m)  Wt 127 lb 6.4 oz (57.788 kg)  BMI 21.20 kg/m2  SpO2 96% Patient alert and oriented and in no cardiopulmonary distress.  HEENT: No facial asymmetry, EOMI,   oropharynx pink and moist.  Neck supple no JVD, possible small nodule on left side of neck, non tender , no thyromegaly, no bruit  Chest: Clear to auscultation bilaterally.  CVS: S1, S2 no murmurs, no S3.Regular rate.  ABD: Soft non tender.   Ext: No edema  MS: Adequate though  reduced  ROM spine,  And  Hips, normal in shoulders  and knees.  Skin: Intact, no ulcerations or rash noted.  Psych: Good eye contact, anxious  affect. Memory intact  Anxious, tearful and at times  depressed appearing.  CNS: CN 2-12 intact, power,  normal throughout.no focal deficits noted.        Assessment & Plan:  Systemic lupus Pt requesting referral to another rheumatologist, states she is unclear as to her diagnosis and wants to find a new Provider. Continues to have generalized pain , she is anxious and concerned about her health , wants to establishe with a specialist re rhuematologic issues in [place] preferably, does not want to travel any further,   DEPRESSION Pt tearful and somewhat overwhelmed. She has pressure at home as well as with her health Still no interest in therapy , which she needs, not suicidal or homicidal, , she vented for approx 10 minutes, no changes made with her meds  HYPERTENSION Systolic pressure elevated slightly, however pt very anxious and tearful at visit. No meds changed at this time  Need for vaccination with 13-polyvalent pneumococcal conjugate vaccine Vaccine administered at visit  [place], CHRONIC Well managed currently through pain management in [facility]

## 2013-09-19 ENCOUNTER — Other Ambulatory Visit: Payer: Self-pay

## 2013-09-19 MED ORDER — ERGOCALCIFEROL 1.25 MG (50000 UT) PO CAPS
ORAL_CAPSULE | ORAL | Status: DC
Start: 2013-09-19 — End: 2014-03-13

## 2013-10-01 ENCOUNTER — Other Ambulatory Visit (HOSPITAL_COMMUNITY): Payer: Medicare Other

## 2013-10-01 ENCOUNTER — Other Ambulatory Visit: Payer: Self-pay

## 2013-10-01 ENCOUNTER — Telehealth: Payer: Self-pay | Admitting: Family Medicine

## 2013-10-01 ENCOUNTER — Telehealth: Payer: Self-pay

## 2013-10-01 ENCOUNTER — Ambulatory Visit (HOSPITAL_COMMUNITY)
Admission: RE | Admit: 2013-10-01 | Discharge: 2013-10-01 | Disposition: A | Discharge status: Home / Self Care | Payer: Medicare Other | Source: Ambulatory Visit | Attending: Family Medicine | Admitting: Family Medicine

## 2013-10-01 DIAGNOSIS — Z96649 Presence of unspecified artificial hip joint: Secondary | ICD-10-CM | POA: Diagnosis not present

## 2013-10-01 DIAGNOSIS — E041 Nontoxic single thyroid nodule: Secondary | ICD-10-CM | POA: Diagnosis not present

## 2013-10-01 DIAGNOSIS — R221 Localized swelling, mass and lump, neck: Secondary | ICD-10-CM

## 2013-10-01 DIAGNOSIS — M25559 Pain in unspecified hip: Secondary | ICD-10-CM | POA: Diagnosis not present

## 2013-10-01 DIAGNOSIS — R229 Localized swelling, mass and lump, unspecified: Secondary | ICD-10-CM | POA: Diagnosis present

## 2013-10-01 DIAGNOSIS — M47817 Spondylosis without myelopathy or radiculopathy, lumbosacral region: Secondary | ICD-10-CM | POA: Diagnosis not present

## 2013-10-01 DIAGNOSIS — M48061 Spinal stenosis, lumbar region without neurogenic claudication: Secondary | ICD-10-CM | POA: Diagnosis not present

## 2013-10-01 MED ORDER — CYCLOBENZAPRINE HCL 10 MG PO TABS: 10.0000 mg | ORAL_TABLET | Freq: Every day | ORAL | Status: DC

## 2013-10-01 MED ORDER — ALPRAZOLAM 0.25 MG PO TABS: ORAL_TABLET | ORAL | Status: DC

## 2013-10-01 NOTE — Assessment & Plan Note (Signed)
Systolic pressure elevated slightly, however pt very anxious and tearful at visit. No meds changed at this time

## 2013-10-01 NOTE — Telephone Encounter (Signed)
States she has been doing too much yard work trying to escape her problems and she has been hurting very bad in her ribs. Wanted to know if she could have a muscle relaxer (flexeril worked well in past) also she started crying on the phone telling me about her problems and wanted to know if she could have a short supply of xanax to get her through this tough time. Please advise

## 2013-10-01 NOTE — Telephone Encounter (Signed)
pls see response 

## 2013-10-01 NOTE — Telephone Encounter (Signed)
I have entered a referral to rheumatologist , pot requests [place], due to proximity. Pls check and let me know if any provider in [place] who accepts her ins will see her. Dr Charlestine Night , I think declined, and she was a pt of Dr Estanislado Pandy, seeking a new MD We will need to talk together and try to sort it out together, pls find me and update as to how things are going with this , thanks I known this is a challenge so i expect to be asked to help as able

## 2013-10-01 NOTE — Assessment & Plan Note (Signed)
Well managed currently through pain management in [facility]

## 2013-10-01 NOTE — Telephone Encounter (Signed)
Message left for patient and sent med in

## 2013-10-01 NOTE — Assessment & Plan Note (Signed)
Pt requesting referral to another rheumatologist, states she is unclear as to her diagnosis and wants to find a new Provider. Continues to have generalized pain , she is anxious and concerned about her health , wants to establishe with a specialist re rhuematologic issues in [place] preferably, does not want to travel any further,

## 2013-10-01 NOTE — Assessment & Plan Note (Signed)
Vaccine administered at visit.  

## 2013-10-01 NOTE — Assessment & Plan Note (Signed)
Pt tearful and somewhat overwhelmed. She has pressure at home as well as with her health Still no interest in therapy , which she needs, not suicidal or homicidal, , she vented for approx 10 minutes, no changes made with her meds

## 2013-10-01 NOTE — Telephone Encounter (Signed)
Send felexeril 10 mg one at bedtime as needed #30 refill 1 pls  I have printed 20 low diose xanax.  Tell her that I am concerned about her anxiety and problems , and that I dO STILL recommend getting help from a psychotherapist to dal with the stressful thing in her life as far as coping is concerned pls refer if she agrees, she has resisted before

## 2013-10-01 NOTE — Telephone Encounter (Signed)
Med sent anx patient called and no answer

## 2013-10-03 DIAGNOSIS — H538 Other visual disturbances: Secondary | ICD-10-CM | POA: Diagnosis not present

## 2013-10-03 DIAGNOSIS — H2510 Age-related nuclear cataract, unspecified eye: Secondary | ICD-10-CM | POA: Diagnosis not present

## 2013-11-12 ENCOUNTER — Other Ambulatory Visit: Payer: Self-pay | Admitting: Family Medicine

## 2013-11-28 DIAGNOSIS — M19031 Primary osteoarthritis, right wrist: Secondary | ICD-10-CM | POA: Diagnosis not present

## 2013-11-28 DIAGNOSIS — M79641 Pain in right hand: Secondary | ICD-10-CM | POA: Diagnosis not present

## 2013-11-28 DIAGNOSIS — M1990 Unspecified osteoarthritis, unspecified site: Secondary | ICD-10-CM | POA: Diagnosis not present

## 2013-11-28 DIAGNOSIS — M79642 Pain in left hand: Secondary | ICD-10-CM | POA: Diagnosis not present

## 2013-11-28 DIAGNOSIS — M329 Systemic lupus erythematosus, unspecified: Secondary | ICD-10-CM | POA: Diagnosis not present

## 2013-11-28 DIAGNOSIS — R768 Other specified abnormal immunological findings in serum: Secondary | ICD-10-CM | POA: Diagnosis not present

## 2013-11-28 DIAGNOSIS — M79643 Pain in unspecified hand: Secondary | ICD-10-CM | POA: Diagnosis not present

## 2013-11-29 ENCOUNTER — Other Ambulatory Visit: Payer: Self-pay | Admitting: Family Medicine

## 2013-12-05 ENCOUNTER — Other Ambulatory Visit: Payer: Self-pay | Admitting: Family Medicine

## 2013-12-09 ENCOUNTER — Other Ambulatory Visit: Payer: Self-pay | Admitting: Family Medicine

## 2013-12-18 ENCOUNTER — Encounter: Payer: Self-pay | Admitting: Internal Medicine

## 2013-12-20 ENCOUNTER — Telehealth: Payer: Self-pay | Admitting: Family Medicine

## 2013-12-20 ENCOUNTER — Other Ambulatory Visit: Payer: Self-pay | Admitting: Family Medicine

## 2013-12-20 NOTE — Telephone Encounter (Signed)
pls let her know per ins, flexeril not covered in 2016, I am substituting tizanidine effective 02/2014,  entered historically, paper to be faxed is in your area

## 2013-12-26 DIAGNOSIS — M79643 Pain in unspecified hand: Secondary | ICD-10-CM | POA: Diagnosis not present

## 2013-12-26 DIAGNOSIS — M1990 Unspecified osteoarthritis, unspecified site: Secondary | ICD-10-CM | POA: Diagnosis not present

## 2013-12-26 DIAGNOSIS — M329 Systemic lupus erythematosus, unspecified: Secondary | ICD-10-CM | POA: Diagnosis not present

## 2013-12-26 DIAGNOSIS — M545 Low back pain: Secondary | ICD-10-CM | POA: Diagnosis not present

## 2013-12-27 ENCOUNTER — Other Ambulatory Visit: Payer: Self-pay | Admitting: Family Medicine

## 2014-01-14 ENCOUNTER — Ambulatory Visit (INDEPENDENT_AMBULATORY_CARE_PROVIDER_SITE_OTHER): Payer: Medicare Other

## 2014-01-14 ENCOUNTER — Ambulatory Visit (INDEPENDENT_AMBULATORY_CARE_PROVIDER_SITE_OTHER): Payer: Medicare Other | Admitting: Family Medicine

## 2014-01-14 ENCOUNTER — Encounter: Payer: Self-pay | Admitting: Family Medicine

## 2014-01-14 VITALS — BP 108/62 | HR 63 | Resp 16 | Ht 65.0 in | Wt 133.0 lb

## 2014-01-14 DIAGNOSIS — R7301 Impaired fasting glucose: Secondary | ICD-10-CM | POA: Diagnosis not present

## 2014-01-14 DIAGNOSIS — F5105 Insomnia due to other mental disorder: Secondary | ICD-10-CM

## 2014-01-14 DIAGNOSIS — Z79899 Other long term (current) drug therapy: Secondary | ICD-10-CM

## 2014-01-14 DIAGNOSIS — G44021 Chronic cluster headache, intractable: Secondary | ICD-10-CM | POA: Diagnosis not present

## 2014-01-14 DIAGNOSIS — M545 Low back pain: Secondary | ICD-10-CM | POA: Diagnosis not present

## 2014-01-14 DIAGNOSIS — R51 Headache: Secondary | ICD-10-CM

## 2014-01-14 DIAGNOSIS — Z23 Encounter for immunization: Secondary | ICD-10-CM | POA: Diagnosis not present

## 2014-01-14 DIAGNOSIS — Z13220 Encounter for screening for lipoid disorders: Secondary | ICD-10-CM

## 2014-01-14 DIAGNOSIS — F341 Dysthymic disorder: Secondary | ICD-10-CM | POA: Diagnosis not present

## 2014-01-14 DIAGNOSIS — A6000 Herpesviral infection of urogenital system, unspecified: Secondary | ICD-10-CM

## 2014-01-14 DIAGNOSIS — G8929 Other chronic pain: Secondary | ICD-10-CM | POA: Insufficient documentation

## 2014-01-14 DIAGNOSIS — I10 Essential (primary) hypertension: Secondary | ICD-10-CM

## 2014-01-14 DIAGNOSIS — E559 Vitamin D deficiency, unspecified: Secondary | ICD-10-CM

## 2014-01-14 DIAGNOSIS — F418 Other specified anxiety disorders: Secondary | ICD-10-CM

## 2014-01-14 DIAGNOSIS — R519 Headache, unspecified: Secondary | ICD-10-CM | POA: Insufficient documentation

## 2014-01-14 MED ORDER — BUSPIRONE HCL 5 MG PO TABS: 5.0000 mg | ORAL_TABLET | Freq: Three times a day (TID) | ORAL | Status: DC

## 2014-01-14 MED ORDER — CLONIDINE HCL 0.3 MG PO TABS: 0.3000 mg | ORAL_TABLET | Freq: Two times a day (BID) | ORAL | Status: DC

## 2014-01-14 MED ORDER — KETOROLAC TROMETHAMINE 60 MG/2ML IM SOLN
60.0000 mg | Freq: Once | INTRAMUSCULAR | Status: AC
  Administered 2014-01-14: 60 mg via INTRAMUSCULAR

## 2014-01-14 MED ORDER — BUTALBITAL-APAP-CAFFEINE 50-325-40 MG PO TABS: ORAL_TABLET | ORAL | Status: DC

## 2014-01-14 MED ORDER — METHYLPREDNISOLONE ACETATE 80 MG/ML IJ SUSP
80.0000 mg | Freq: Once | INTRAMUSCULAR | Status: AC
Start: 2014-01-14 — End: 2014-01-14
  Administered 2014-01-14: 80 mg via INTRAMUSCULAR

## 2014-01-14 MED ORDER — TIZANIDINE HCL 4 MG PO TABS: 4.0000 mg | ORAL_TABLET | Freq: Every day | ORAL | Status: DC

## 2014-01-14 MED ORDER — PREDNISONE 5 MG PO TABS: ORAL_TABLET | ORAL | Status: DC

## 2014-01-14 MED ORDER — TOPIRAMATE 50 MG PO TABS: 50.0000 mg | ORAL_TABLET | Freq: Two times a day (BID) | ORAL | Status: DC

## 2014-01-14 NOTE — Progress Notes (Signed)
   Subjective:    Patient ID: Renee Waters, female    DOB: 11-07-1945, 68 y.o.   MRN: [id number]  HPI The PT is here for follow up and re-evaluation of chronic medical conditions, medication management and review of any available recent lab and radiology data.  Preventive health is updated, specifically  Cancer screening and Immunization.   Questions or concerns regarding consultations or procedures which the PT has had in the interim are  Addressed.Has established with a new rheumatologist who she is happy with The PT denies any adverse reactions to current medications since the last visit.  C/o poor sleep on no medication regularly, and has had generalized throbbing headache for over 1 week, denies new weakness,  Numbness or difficulty with speech      Review of Systems See HPI Denies recent fever or chills. Denies sinus pressure, nasal congestion, ear pain or sore throat. Denies chest congestion, productive cough or wheezing. Denies chest pains, palpitations and leg swelling Denies abdominal pain, nausea, vomiting,diarrhea or constipation.   Denies dysuria, frequency, hesitancy or incontinence. Chronic nies joint pain,  and limitation in mobility.Fair control through pain management Denies , seizures,  Has intermittent lower extremity   tingling. Denies uncontrolled  depression, anxiety has significant r insomnia. Denies skin break down or rash.        Objective:   Physical Exam BP 108/62 mmHg  Pulse 63  Resp 16  Ht 5\' 5"  (1.651 m)  Wt 133 lb (60.328 kg)  BMI 22.13 kg/m2  SpO2 97% Patient alert and oriented and in no cardiopulmonary distress.Pt in pain  HEENT: No facial asymmetry, EOMI,   oropharynx pink and moist.  Neck supple no JVD, no mass. Fundoscopy: no hemorhage noted, No sinus tenderness Chest: Clear to auscultation bilaterally.  CVS: S1, S2 no murmurs, no S3.Regular rate.  ABD: Soft non tender.   Ext: No edema  MS:  reduced  ROM spine,  Adequate in  shoulders, hips and knees.  Skin: Intact, no ulcerations or rash noted.  Psych: Good eye contact, normal affect. Memory intact not anxious or depressed appearing.  CNS: CN 2-12 intact, power,  normal throughout.no focal deficits noted.        Assessment & Plan:  Insomnia secondary to depression with anxiety Uncontrolled an deteriorated,  Non compliant with chronic regime recommended, sheis to resume this Sleep hygiene reviewed and written information offered also. Prescription sent for  medication needed.   Chronic headache disorder Cluster headache present, has migraine history and compounded with uncontrolled insomnia, has been suffering for several days. No neurologic deficits, nausea or vomiting associated Toradol and depo medrol in office followed by prednisone dose pack Pt to also start topamax as prophylaxis and use fioricet for uncontrolled headache, limited to  4 tabs per week due to possibility of dependence and rebound headache  Essential hypertension Controlled, no change in medication DASH diet and commitment to daily physical activity for a minimum of 30 minutes discussed and encouraged, as a part of hypertension management. The importance of attaining a healthy weight is also discussed.   GERD (gastroesophageal reflux disease) Controlled, no change in medication \  Backache Improved management through pain clinic, continue same  Genital herpes Controlled on daily prophylactic medication , no recent flare of symptoms  Need for prophylactic vaccination and inoculation against influenza Vaccine administered at visit.

## 2014-01-14 NOTE — Telephone Encounter (Signed)
Patient aware and this was faxed to her pharmacy

## 2014-01-14 NOTE — Patient Instructions (Addendum)
F/u in 7 weeks, call if you need me before \  Flu vaccine today  New for headache prevention is topamax twice daily  New for headache management is fioricet tabs no more than 4 in 1 week  Resume xanax one at bedtime to help with sleep  Continue restoril one at bedtime  Start gabapentin one at bedtime  Good rest is VITAL for health and wellbeing  Prednisone for 5 days is prescribed for cluster headache start today  CBC, fasting lipid, cmp, TSH and Vit D in 7 weeks before visit  All the best for 2016!  Toradol and depo medrol in office today for headache

## 2014-01-19 DIAGNOSIS — Z23 Encounter for immunization: Secondary | ICD-10-CM | POA: Insufficient documentation

## 2014-01-19 NOTE — Assessment & Plan Note (Signed)
Uncontrolled an deteriorated,  Non compliant with chronic regime recommended, sheis to resume this Sleep hygiene reviewed and written information offered also. Prescription sent for  medication needed.

## 2014-01-19 NOTE — Assessment & Plan Note (Addendum)
Cluster headache present, has migraine history and compounded with uncontrolled insomnia, has been suffering for several days. No neurologic deficits, nausea or vomiting associated Toradol and depo medrol in office followed by prednisone dose pack Pt to also start topamax as prophylaxis and use fioricet for uncontrolled headache, limited to  4 tabs per week due to possibility of dependence and rebound headache

## 2014-01-19 NOTE — Assessment & Plan Note (Signed)
Improved management through pain clinic, continue same 

## 2014-01-19 NOTE — Assessment & Plan Note (Signed)
Controlled, no change in medication DASH diet and commitment to daily physical activity for a minimum of 30 minutes discussed and encouraged, as a part of hypertension management. The importance of attaining a healthy weight is also discussed.  

## 2014-01-19 NOTE — Assessment & Plan Note (Signed)
Controlled, no change in medication  

## 2014-01-19 NOTE — Assessment & Plan Note (Signed)
Controlled on daily prophylactic medication , no recent flare of symptoms

## 2014-01-19 NOTE — Assessment & Plan Note (Signed)
Vaccine administered at visit.  

## 2014-01-28 ENCOUNTER — Other Ambulatory Visit: Payer: Self-pay | Admitting: Family Medicine

## 2014-02-03 ENCOUNTER — Other Ambulatory Visit: Payer: Self-pay | Admitting: Family Medicine

## 2014-02-10 ENCOUNTER — Other Ambulatory Visit: Payer: Self-pay | Admitting: Family Medicine

## 2014-02-11 ENCOUNTER — Telehealth: Payer: Self-pay | Admitting: Family Medicine

## 2014-02-11 ENCOUNTER — Encounter: Payer: Self-pay | Admitting: Internal Medicine

## 2014-02-11 ENCOUNTER — Other Ambulatory Visit: Payer: Self-pay | Admitting: Family Medicine

## 2014-02-11 DIAGNOSIS — Z1231 Encounter for screening mammogram for malignant neoplasm of breast: Secondary | ICD-10-CM

## 2014-02-11 MED ORDER — PREDNISONE (PAK) 5 MG PO TABS: 5.0000 mg | ORAL_TABLET | ORAL | Status: DC

## 2014-02-11 NOTE — Telephone Encounter (Signed)
5mg  dose pack sent pls let her know. She may need to consider epidural with Dr Lyla Son if has recurrent flares

## 2014-02-11 NOTE — Telephone Encounter (Signed)
Called and left message for patient notifying of her medicine being sent.

## 2014-02-11 NOTE — Telephone Encounter (Signed)
Patient states that she is having a flare of back pain x 2 weeks.  Would like to know if she can have another course of prednisone sent in.  Received last prescription on 12/8

## 2014-02-12 DIAGNOSIS — M5136 Other intervertebral disc degeneration, lumbar region: Secondary | ICD-10-CM | POA: Diagnosis not present

## 2014-02-12 DIAGNOSIS — M9983 Other biomechanical lesions of lumbar region: Secondary | ICD-10-CM | POA: Diagnosis not present

## 2014-02-12 DIAGNOSIS — M545 Low back pain: Secondary | ICD-10-CM | POA: Diagnosis not present

## 2014-02-12 DIAGNOSIS — M47816 Spondylosis without myelopathy or radiculopathy, lumbar region: Secondary | ICD-10-CM | POA: Diagnosis not present

## 2014-02-17 ENCOUNTER — Other Ambulatory Visit: Payer: Self-pay | Admitting: Family Medicine

## 2014-03-04 DIAGNOSIS — G8929 Other chronic pain: Secondary | ICD-10-CM | POA: Diagnosis not present

## 2014-03-04 DIAGNOSIS — M47816 Spondylosis without myelopathy or radiculopathy, lumbar region: Secondary | ICD-10-CM | POA: Diagnosis not present

## 2014-03-04 DIAGNOSIS — M1990 Unspecified osteoarthritis, unspecified site: Secondary | ICD-10-CM | POA: Diagnosis not present

## 2014-03-04 DIAGNOSIS — Z87891 Personal history of nicotine dependence: Secondary | ICD-10-CM | POA: Diagnosis not present

## 2014-03-04 DIAGNOSIS — M5408 Panniculitis affecting regions of neck and back, sacral and sacrococcygeal region: Secondary | ICD-10-CM | POA: Diagnosis not present

## 2014-03-04 DIAGNOSIS — Z981 Arthrodesis status: Secondary | ICD-10-CM | POA: Diagnosis not present

## 2014-03-04 DIAGNOSIS — F329 Major depressive disorder, single episode, unspecified: Secondary | ICD-10-CM | POA: Diagnosis not present

## 2014-03-04 DIAGNOSIS — M25551 Pain in right hip: Secondary | ICD-10-CM | POA: Diagnosis not present

## 2014-03-04 DIAGNOSIS — Z888 Allergy status to other drugs, medicaments and biological substances status: Secondary | ICD-10-CM | POA: Diagnosis not present

## 2014-03-04 DIAGNOSIS — M5136 Other intervertebral disc degeneration, lumbar region: Secondary | ICD-10-CM | POA: Diagnosis not present

## 2014-03-04 DIAGNOSIS — M9983 Other biomechanical lesions of lumbar region: Secondary | ICD-10-CM | POA: Diagnosis not present

## 2014-03-04 DIAGNOSIS — Z7982 Long term (current) use of aspirin: Secondary | ICD-10-CM | POA: Diagnosis not present

## 2014-03-04 DIAGNOSIS — I10 Essential (primary) hypertension: Secondary | ICD-10-CM | POA: Diagnosis not present

## 2014-03-04 DIAGNOSIS — G4700 Insomnia, unspecified: Secondary | ICD-10-CM | POA: Diagnosis not present

## 2014-03-04 DIAGNOSIS — G5600 Carpal tunnel syndrome, unspecified upper limb: Secondary | ICD-10-CM | POA: Diagnosis not present

## 2014-03-04 DIAGNOSIS — Z79899 Other long term (current) drug therapy: Secondary | ICD-10-CM | POA: Diagnosis not present

## 2014-03-04 DIAGNOSIS — Z96641 Presence of right artificial hip joint: Secondary | ICD-10-CM | POA: Diagnosis not present

## 2014-03-04 DIAGNOSIS — F419 Anxiety disorder, unspecified: Secondary | ICD-10-CM | POA: Diagnosis not present

## 2014-03-10 ENCOUNTER — Encounter: Payer: Self-pay | Admitting: Family Medicine

## 2014-03-10 ENCOUNTER — Ambulatory Visit (INDEPENDENT_AMBULATORY_CARE_PROVIDER_SITE_OTHER): Payer: Medicare Other | Admitting: Family Medicine

## 2014-03-10 VITALS — BP 136/74 | HR 70 | Resp 18 | Ht 65.0 in | Wt 131.0 lb

## 2014-03-10 DIAGNOSIS — G44021 Chronic cluster headache, intractable: Secondary | ICD-10-CM

## 2014-03-10 DIAGNOSIS — F418 Other specified anxiety disorders: Secondary | ICD-10-CM

## 2014-03-10 DIAGNOSIS — G43709 Chronic migraine without aura, not intractable, without status migrainosus: Secondary | ICD-10-CM

## 2014-03-10 DIAGNOSIS — F341 Dysthymic disorder: Secondary | ICD-10-CM | POA: Diagnosis not present

## 2014-03-10 DIAGNOSIS — F5105 Insomnia due to other mental disorder: Secondary | ICD-10-CM | POA: Diagnosis not present

## 2014-03-10 DIAGNOSIS — I10 Essential (primary) hypertension: Secondary | ICD-10-CM | POA: Diagnosis not present

## 2014-03-10 DIAGNOSIS — F411 Generalized anxiety disorder: Secondary | ICD-10-CM

## 2014-03-10 DIAGNOSIS — K219 Gastro-esophageal reflux disease without esophagitis: Secondary | ICD-10-CM

## 2014-03-10 MED ORDER — SUMATRIPTAN SUCCINATE 100 MG PO TABS: ORAL_TABLET | ORAL | Status: DC

## 2014-03-10 MED ORDER — TEMAZEPAM 30 MG PO CAPS: 30.0000 mg | ORAL_CAPSULE | Freq: Every evening | ORAL | Status: DC | PRN

## 2014-03-10 MED ORDER — BUSPIRONE HCL 7.5 MG PO TABS: 7.5000 mg | ORAL_TABLET | Freq: Three times a day (TID) | ORAL | Status: DC

## 2014-03-10 NOTE — Patient Instructions (Signed)
Annual wellness in 3 month, call if you need me before  New medication for headache management in place of fioricet is immitrex, FOLLOW DIRECTION, NO MORE THAN 2 TABLETS in 24 hours PLEASE  Higher dose of buspar for anxiety, use new coping skills also   May take benadryl 25 mg one at night if still having sleep difficulty along with the restoril  Hope things improve \ I am happy to hear of good response to recent injection  Call for MRI brain if headaches persist

## 2014-03-10 NOTE — Progress Notes (Signed)
   Subjective:    Patient ID: Renee Waters, female    DOB: August 16, 1945, 69 y.o.   MRN: [id number]  HPI The PT is here for follow up and re-evaluation of chronic medical conditions, medication management and review of any available recent lab and radiology data.  Preventive health is updated, specifically  Cancer screening and Immunization.   Questions or concerns regarding consultations or procedures which the PT has had in the interim are  Addressed.Recently had specific nerve block fronm pain specialist which hopefully will be very effective in treating her chronic pain The PT denies any adverse reactions to current medications since the last visit.  C/o increased and uncontrolled headaches, as well as increased anxiety and depression, ongoing problems at home with spouse      Review of Systems See HPI Denies recent fever or chills. Denies sinus pressure, nasal congestion, ear pain or sore throat. Denies chest congestion, productive cough or wheezing. Denies chest pains, palpitations and leg swelling Denies abdominal pain, nausea, vomiting,diarrhea or constipation.   Denies dysuria, frequency, hesitancy or incontinence. Denies skin break down or rash.        Objective:   Physical Exam BP 136/74 mmHg  Pulse 70  Resp 18  Ht 5\' 5"  (1.651 m)  Wt 131 lb 0.6 oz (59.439 kg)  BMI 21.81 kg/m2  SpO2 95%   Patient alert and oriented and in no cardiopulmonary distress.  HEENT: No facial asymmetry, EOMI,   oropharynx pink and moist.  Neck decreased ROM, no JVD, no mass.  Chest: Clear to auscultation bilaterally.  CVS: S1, S2 no murmurs, no S3.Regular rate.  ABD: Soft non tender.   Ext: No edema  MS: decreased  ROM spine, adequate in shoulders, hips and knees.  Skin: Intact, no ulcerations or rash noted.  Psych: Good eye contact, normal affect. Memory intact mildly tearful and anxious at times, depressed appearing.  CNS: CN 2-12 intact, power,  normal throughout.no  focal deficits noted.        Assessment & Plan:  Insomnia secondary to depression with anxiety Uncontrolled, pt may add benadryl Sleep hygiene reviewed and written information offered also. Prescription sent for  medication needed.    GAD (generalized anxiety disorder) Worsened, increased family stress, states no interest in therapy, the last time she went to therapist she felt worse medciation adjustment made   Essential hypertension Controlled, no change in medication DASH diet and commitment to daily physical activity for a minimum of 30 minutes discussed and encouraged, as a part of hypertension management. The importance of attaining a healthy weight is also discussed.    GERD (gastroesophageal reflux disease) Controlled, no change in medication    Headache, chronic migraine without aura Uncontrolled headaches, increased frequency, no real response to fioricet. Pt to start topamax for prophylaxis and use imitrex for abortion of the headache

## 2014-03-12 ENCOUNTER — Ambulatory Visit (HOSPITAL_COMMUNITY)
Admission: RE | Admit: 2014-03-12 | Discharge: 2014-03-12 | Disposition: A | Discharge status: Home / Self Care | Payer: Medicare Other | Source: Ambulatory Visit | Attending: Family Medicine | Admitting: Family Medicine

## 2014-03-12 DIAGNOSIS — Z1231 Encounter for screening mammogram for malignant neoplasm of breast: Secondary | ICD-10-CM | POA: Diagnosis not present

## 2014-03-12 DIAGNOSIS — Z139 Encounter for screening, unspecified: Secondary | ICD-10-CM

## 2014-03-13 ENCOUNTER — Telehealth: Payer: Self-pay | Admitting: *Deleted

## 2014-03-13 DIAGNOSIS — G44021 Chronic cluster headache, intractable: Secondary | ICD-10-CM

## 2014-03-13 MED ORDER — ALPRAZOLAM 0.25 MG PO TABS: ORAL_TABLET | ORAL | Status: DC

## 2014-03-13 MED ORDER — ACYCLOVIR 800 MG PO TABS: ORAL_TABLET | ORAL | Status: DC

## 2014-03-13 MED ORDER — CITALOPRAM HYDROBROMIDE 20 MG PO TABS: 20.0000 mg | ORAL_TABLET | Freq: Every day | ORAL | Status: DC

## 2014-03-13 MED ORDER — ERGOCALCIFEROL 1.25 MG (50000 UT) PO CAPS: ORAL_CAPSULE | ORAL | Status: DC

## 2014-03-13 MED ORDER — TOPIRAMATE 50 MG PO TABS: 50.0000 mg | ORAL_TABLET | Freq: Two times a day (BID) | ORAL | Status: DC

## 2014-03-13 NOTE — Telephone Encounter (Signed)
meds refilled 

## 2014-03-13 NOTE — Telephone Encounter (Signed)
Pt called requesting her medications to be refilled. Please advise

## 2014-03-14 ENCOUNTER — Other Ambulatory Visit: Payer: Self-pay

## 2014-03-14 ENCOUNTER — Ambulatory Visit (INDEPENDENT_AMBULATORY_CARE_PROVIDER_SITE_OTHER): Payer: Medicare Other | Admitting: Gastroenterology

## 2014-03-14 ENCOUNTER — Encounter: Payer: Self-pay | Admitting: Gastroenterology

## 2014-03-14 VITALS — BP 112/68 | HR 61 | Temp 97.5°F | Ht 65.0 in | Wt 131.2 lb

## 2014-03-14 DIAGNOSIS — K219 Gastro-esophageal reflux disease without esophagitis: Secondary | ICD-10-CM

## 2014-03-14 DIAGNOSIS — K2270 Barrett's esophagus without dysplasia: Secondary | ICD-10-CM

## 2014-03-14 NOTE — Assessment & Plan Note (Signed)
69 year old lady with history of short segment Barrett's esophagus, last EGD 2012 who presents for surveillance exam. Currently doing very well from a GI standpoint. Rarely takes omeprazole. Plan for EGD in the near future with deep sedation given polypharmacy.  I have discussed the risks, alternatives, benefits with regards to but not limited to the risk of reaction to medication, bleeding, infection, perforation and the patient is agreeable to proceed. Written consent to be obtained.  Encouraged her to take omeprazole at least every other day given her Barrett's esophagus.

## 2014-03-14 NOTE — Patient Instructions (Signed)
   Upper endoscopy to re-evaluate Barrett's esophagus.   Continue omeprazole at least every other day even if you do not have heartburn.  Barrett's Esophagus Barrett's esophagus occurs when the lining of the esophagus is damaged. The esophagus is the tube that carries food from the mouth to the stomach. With Barrett's esophagus, the lining of the esophagus gets replaced by material that is similar to the lining in the intestines. This process is called intestinal metaplasia. A small number of people with Barrett's esophagus develop esophageal cancer. CAUSES  The exact cause of Barrett's esophagus is unknown. SYMPTOMS  Most people with Barrett's esophagus do not have symptoms. However, many patients also have gastroesophageal reflux disease (GERD). GERD can cause heartburn, trouble swallowing, and a dry cough. DIAGNOSIS Barrett's esophagus is diagnosed by an exam called upper gastrointestinal endoscopy. A thin, flexible tube (endoscope) is passed down the esophagus. The endoscope has a light and camera on the end. Your caregiver uses the endoscope to view the inside of the esophagus. A tissue sample may also be taken and examined under a microscope (biopsy). If cancer cells are found during the biopsy, this condition is called dysplasia. TREATMENT  If you have no dysplasia or low-grade dysplasia, your caregiver may recommend no treatment or only taking medicines to treat GERD. Sometimes, taking acid-blocking drugs to treat GERD helps improve the tissue affected by Barrett's esophagus. Your caregiver may also recommend periodic esophageal exams. If you have high-grade dysplasia, treatment may include removing the damaged parts of the esophagus. This can be done by heating, freezing, or surgically removing the tissue. In some cases, surgery may be done to remove most of the esophagus. The stomach is then attached to the remaining portion of the esophagus. HOME CARE INSTRUCTIONS  Take acid-blocking drugs  for GERD if recommended by your caregiver.  Keep all follow-up appointments as directed by your caregiver. You may need periodic esophageal exams. SEEK IMMEDIATE MEDICAL CARE IF:  You have chest pain.  You have trouble swallowing.  You vomit blood or material that looks like coffee grounds.  Your stools are bright red or dark. Document Released: 04/16/2003 Document Revised: 07/26/2011 Document Reviewed: 04/05/2011 [facility] Patient Information 2015 [place], [place]. This information is not intended to replace advice given to you by your health care provider. Make sure you discuss any questions you have with your health care provider.

## 2014-03-14 NOTE — Progress Notes (Signed)
Primary Care Physician:  Tula Nakayama, MD  Primary Gastroenterologist:  Garfield Cornea, MD   Chief Complaint  Patient presents with  . recall for EGD    HPI:  Renee Waters is a 69 y.o. female here schedule surveillance EGD for history of Barrett's esophagus. Last EGD was in 2012. Overall she's been doing well from a GI standpoint. Rarely has heartburn. Tells me that she takes omeprazole only when she has heartburn. Denies dysphagia, nausea or vomiting, unintentional weight loss, abdominal pain, constipation, diarrhea, melena, rectal bleeding.  Current Outpatient Prescriptions  Medication Sig Dispense Refill  . acyclovir (ZOVIRAX) 800 MG tablet TAKE ONE TABLET BY MOUTH 4 TIMES DAILY AS NEEDED FOR BREAKOUTS 30 tablet 0  . ALPRAZolam (XANAX) 0.25 MG tablet TAKE ONE TABLET BY MOUTH ONCE DAILY AS NEEDED FOR EXCESSIVE ANXIETY 20 tablet 2  . busPIRone (BUSPAR) 7.5 MG tablet Take 1 tablet (7.5 mg total) by mouth 3 (three) times daily. 90 tablet 3  . citalopram (CELEXA) 20 MG tablet Take 1 tablet (20 mg total) by mouth daily. 30 tablet 4  . cloNIDine (CATAPRES) 0.3 MG tablet Take 1 tablet (0.3 mg total) by mouth 2 (two) times daily. 60 tablet 3  . ergocalciferol (VITAMIN D2) 50000 UNITS capsule One capsule once weekly 4 capsule 5  . gabapentin (NEURONTIN) 600 MG tablet Take 600 mg by mouth 3 (three) times daily.    . Multiple Vitamins-Minerals (MULTIVITAMINS THER. W/MINERALS) TABS Take 1 tablet by mouth daily.     Marland Kitchen omeprazole (PRILOSEC) 40 MG capsule Take 40 mg by mouth daily.    . Oxycodone HCl 10 MG TABS Take 10 mg by mouth. 1 tab every 6 hours as needed    . SUMAtriptan (IMITREX) 100 MG tablet One tablet at headache onset. May repeat once after 2 hours if headache persists.  Maximum of two tablets in 24 hours 10 tablet 0  . temazepam (RESTORIL) 30 MG capsule Take 1 capsule (30 mg total) by mouth at bedtime as needed. for sleep 30 capsule 3  . tiZANidine (ZANAFLEX) 4 MG tablet Take 1  tablet (4 mg total) by mouth at bedtime. 30 tablet 5  . topiramate (TOPAMAX) 50 MG tablet Take 1 tablet (50 mg total) by mouth 2 (two) times daily. 60 tablet 4  . ferrous sulfate 325 (65 FE) MG EC tablet Take 325 mg by mouth daily with breakfast.    . polyethylene glycol (MIRALAX / GLYCOLAX) packet Take 17 g by mouth 2 (two) times daily. 14 each 0   No current facility-administered medications for this visit.    Allergies as of 03/14/2014 - Review Complete 03/14/2014  Allergen Reaction Noted  . Tylox [oxycodone-acetaminophen] Other (See Comments) 09/27/2010  . Bee venom Swelling 06/12/2012    Past Medical History  Diagnosis Date  . Insomnia   . Chronic back pain   . Chronic neck pain   . Depression   . GERD (gastroesophageal reflux disease)   . Osteoarthritis   . Genital herpes   . Hypertension   . S/P colonoscopy June 2005    normal, no polyps  . S/P endoscopy June 2005, Oct 2009    2005: short-segment Barrett's, 2009: short-segment Barrett's  . Barrett's esophagus   . UTI (lower urinary tract infection) 10/14    currently on Penicillin-  states is improving  . History of blood transfusion     Past Surgical History  Procedure Laterality Date  . Cervical disectomy  2002  . Shoulder arthroscopy  2008  left  . Right hip replacement  07/2010    went back in sept 2012 to fix  . Hernia repair  07/2010    Dr. Zada Girt  . Abdominal hysterectomy    . Esophagogastroduodenoscopy  11/29/2007    salmon-colored  tongue   longest stable at  3 cm, distal esophagus as described previously status post biopsy/ Hiatal hernia, otherwise normal stomach D1 and D2  . Esophagogastroduodenoscopy  01/06/11    short segment Barrett's esophagus s/p bx/Hiatal hernia  . Colonoscopy  11/09/2011    RMR: Melanosis coli  . Total hip revision Right 12/17/2012    Procedure: RIGHT TOTAL HIP REVISION;  Surgeon: Mauri Pole, MD;  Location: [facility];  Service: Orthopedics;  Laterality: Right;     Family History  Problem Relation Age of Onset  . Hypertension Mother   . Colon cancer Neg Hx   . Anesthesia problems Neg Hx   . Hypotension Neg Hx   . Malignant hyperthermia Neg Hx   . Pseudochol deficiency Neg Hx     History   Social History  . Marital Status: Married    Spouse Name: N/A    Number of Children: N/A  . Years of Education: N/A   Occupational History  . Not on file.   Social History Main Topics  . Smoking status: Former Smoker    Types: Cigarettes  . Smokeless tobacco: Never Used     Comment: quit in 2004  . Alcohol Use: Yes     Comment: socially, rare  . Drug Use: No  . Sexual Activity: Not on file   Other Topics Concern  . Not on file   Social History Narrative      ROS:  General: Negative for anorexia, weight loss, fever, chills, fatigue, weakness. Eyes: Negative for vision changes.  ENT: Negative for hoarseness, difficulty swallowing , nasal congestion. CV: Negative for chest pain, angina, palpitations, dyspnea on exertion, peripheral edema.  Respiratory: Negative for dyspnea at rest, dyspnea on exertion, cough, sputum, wheezing.  GI: See history of present illness. GU:  Negative for dysuria, hematuria, urinary incontinence, urinary frequency, nocturnal urination.  MS: Chronic pain syndrome. Mostly back, well controlled.  Derm: Negative for rash or itching.  Neuro: Negative for weakness, abnormal sensation, seizure, frequent headaches, memory loss, confusion.  Psych: Negative for anxiety, depression, suicidal ideation, hallucinations.  Endo: Negative for unusual weight change.  Heme: Negative for bruising or bleeding. Allergy: Negative for rash or hives.    Physical Examination:  BP 112/68 mmHg  Pulse 61  Temp(Src) 97.5 F (36.4 C)  Ht 5\' 5"  (1.651 m)  Wt 131 lb 3.2 oz (59.512 kg)  BMI 21.83 kg/m2   General: Well-nourished, well-developed in no acute distress.  Head: Normocephalic, atraumatic.   Eyes: Conjunctiva pink, no  icterus. Mouth: Oropharyngeal mucosa moist and pink , no lesions erythema or exudate. Neck: Supple without thyromegaly, masses, or lymphadenopathy.  Lungs: Clear to auscultation bilaterally.  Heart: Regular rate and rhythm, no murmurs rubs or gallops.  Abdomen: Bowel sounds are normal, nontender, nondistended, no hepatosplenomegaly or masses, no abdominal bruits or    hernia , no rebound or guarding.   Rectal: Not performed Extremities: No lower extremity edema. No clubbing or deformities.  Neuro: Alert and oriented x 4 , grossly normal neurologically.  Skin: Warm and dry, no rash or jaundice.   Psych: Alert and cooperative, normal mood and affect.  Labs: Lab Results  Component Value Date   WBC 4.2 03/21/2013  HGB 14.3 03/21/2013   HCT 43.6 03/21/2013   MCV 93.4 03/21/2013   PLT 153 03/21/2013   Lab Results  Component Value Date   CREATININE 0.92 07/16/2013   BUN 22 07/16/2013   NA 138 07/16/2013   K 4.1 07/16/2013   CL 106 07/16/2013   CO2 25 07/16/2013      Imaging Studies: Mm Digital Screening  03/13/2014   CLINICAL DATA:  Screening.  EXAM: DIGITAL SCREENING BILATERAL MAMMOGRAM WITH CAD  COMPARISON:  Previous exam(s).  ACR Breast Density Category b: There are scattered areas of fibroglandular density.  FINDINGS: There are no findings suspicious for malignancy. Images were processed with CAD.  IMPRESSION: No mammographic evidence of malignancy. A result letter of this screening mammogram will be mailed directly to the patient.  RECOMMENDATION: Screening mammogram in one year. (Code:[id number]-B-01Y)  BI-RADS CATEGORY  1: Negative.   Electronically Signed   By: Evangeline Dakin M.D.   On: 03/13/2014 08:14

## 2014-03-16 DIAGNOSIS — G43709 Chronic migraine without aura, not intractable, without status migrainosus: Secondary | ICD-10-CM | POA: Insufficient documentation

## 2014-03-16 NOTE — Assessment & Plan Note (Signed)
Uncontrolled, pt may add benadryl Sleep hygiene reviewed and written information offered also. Prescription sent for  medication needed.

## 2014-03-16 NOTE — Assessment & Plan Note (Signed)
Uncontrolled headaches, increased frequency, no real response to fioricet. Pt to start topamax for prophylaxis and use imitrex for abortion of the headache

## 2014-03-16 NOTE — Assessment & Plan Note (Signed)
Controlled, no change in medication DASH diet and commitment to daily physical activity for a minimum of 30 minutes discussed and encouraged, as a part of hypertension management. The importance of attaining a healthy weight is also discussed.  

## 2014-03-16 NOTE — Assessment & Plan Note (Signed)
Worsened, increased family stress, states no interest in therapy, the last time she went to therapist she felt worse medciation adjustment made

## 2014-03-16 NOTE — Assessment & Plan Note (Signed)
Controlled, no change in medication  

## 2014-03-17 NOTE — Patient Instructions (Signed)
Leiliana Foody  03/17/2014   Your procedure is scheduled on:   03/20/2014  Report to [name] at  74  AM.  Call this number if you have problems the morning of surgery: [phone]   Remember:   Do not eat food or drink liquids after midnight.   Take these medicines the morning of surgery with A SIP OF WATER: xanax, buspar, celexa, catapress, gabapentin, oxycodone, topamax   Do not wear jewelry, make-up or nail polish.  Do not wear lotions, powders, or perfumes.   Do not shave 48 hours prior to surgery. Men may shave face and neck.  Do not bring valuables to the hospital.  [facility] is not responsible for any belongings or valuables.               Contacts, dentures or bridgework may not be worn into surgery.  Leave suitcase in the car. After surgery it may be brought to your room.  For patients admitted to the hospital, discharge time is determined by your treatment team.               Patients discharged the day of surgery will not be allowed to drive home.  Name and phone number of your driver: family  Special Instructions: N/A   Please read over the following fact sheets that you were given: Pain Booklet, Coughing and Deep Breathing, Surgical Site Infection Prevention, Anesthesia Post-op Instructions and Care and Recovery After Surgery Esophagogastroduodenoscopy Esophagogastroduodenoscopy (EGD) is a procedure to examine the lining of the esophagus, stomach, and first part of the small intestine (duodenum). A long, flexible, lighted tube with a camera attached (endoscope) is inserted down the throat to view these organs. This procedure is done to detect problems or abnormalities, such as inflammation, bleeding, ulcers, or growths, in order to treat them. The procedure lasts about 5-20 minutes. It is usually an outpatient procedure, but it may need to be performed in emergency cases in the hospital. LET YOUR CAREGIVER KNOW ABOUT:   Allergies to food or medicine.  All  medicines you are taking, including vitamins, herbs, eyedrops, and over-the-counter medicines and creams.  Use of steroids (by mouth or creams).  Previous problems you or members of your family have had with the use of anesthetics.  Any blood disorders you have.  Previous surgeries you have had.  Other health problems you have.  Possibility of pregnancy, if this applies. RISKS AND COMPLICATIONS  Generally, EGD is a safe procedure. However, as with any procedure, complications can occur. Possible complications include:  Infection.  Bleeding.  Tearing (perforation) of the esophagus, stomach, or duodenum.  Difficulty breathing or not being able to breath.  Excessive sweating.  Spasms of the larynx.  Slowed heartbeat.  Low blood pressure. BEFORE THE PROCEDURE  Do not eat or drink anything for 6-8 hours before the procedure or as directed by your caregiver.  Ask your caregiver about changing or stopping your regular medicines.  If you wear dentures, be prepared to remove them before the procedure.  Arrange for someone to drive you home after the procedure. PROCEDURE   A vein will be accessed to give medicines and fluids. A medicine to relax you (sedative) and a pain reliever will be given through that access into the vein.  A numbing medicine (local anesthetic) may be sprayed on your throat for comfort and to stop you from gagging or coughing.  A mouth guard may be placed in your mouth to protect  your teeth and to keep you from biting on the endoscope.  You will be asked to lie on your left side.  The endoscope is inserted down your throat and into the esophagus, stomach, and duodenum.  Air is put through the endoscope to allow your caregiver to view the lining of your esophagus clearly.  The esophagus, stomach, and duodenum is then examined. During the exam, your caregiver may:  Remove tissue to be examined under a microscope (biopsy) for inflammation, infection,  or other medical problems.  Remove growths.  Remove objects (foreign bodies) that are stuck.  Treat any bleeding with medicines or other devices that stop tissues from bleeding (hot cautery, clipping devices).  Widen (dilate) or stretch narrowed areas of the esophagus and stomach.  The endoscope will then be withdrawn. AFTER THE PROCEDURE  You will be taken to a recovery area to be monitored. You will be able to go home once you are stable and alert.  Do not eat or drink anything until the local anesthetic and numbing medicines have worn off. You may choke.  It is normal to feel bloated, have pain with swallowing, or have a sore throat for a short time. This will wear off.  Your caregiver should be able to discuss his or her findings with you. It will take longer to discuss the test results if any biopsies were taken. Document Released: 05/27/2004 Document Revised: 06/10/2013 Document Reviewed: 12/28/2011 [facility] Patient Information 2015 [place], [place]. This information is not intended to replace advice given to you by your health care provider. Make sure you discuss any questions you have with your health care provider. PATIENT INSTRUCTIONS POST-ANESTHESIA  IMMEDIATELY FOLLOWING SURGERY:  Do not drive or operate machinery for the first twenty four hours after surgery.  Do not make any important decisions for twenty four hours after surgery or while taking narcotic pain medications or sedatives.  If you develop intractable nausea and vomiting or a severe headache please notify your doctor immediately.  FOLLOW-UP:  Please make an appointment with your surgeon as instructed. You do not need to follow up with anesthesia unless specifically instructed to do so.  WOUND CARE INSTRUCTIONS (if applicable):  Keep a dry clean dressing on the anesthesia/puncture wound site if there is drainage.  Once the wound has quit draining you may leave it open to air.  Generally you should leave the bandage  intact for twenty four hours unless there is drainage.  If the epidural site drains for more than 36-48 hours please call the anesthesia department.  QUESTIONS?:  Please feel free to call your physician or the hospital operator if you have any questions, and they will be happy to assist you.

## 2014-03-18 ENCOUNTER — Encounter (HOSPITAL_COMMUNITY)
Admission: RE | Admit: 2014-03-18 | Discharge: 2014-03-18 | Disposition: A | Discharge status: Home / Self Care | Payer: Medicare Other | Source: Ambulatory Visit | Attending: Internal Medicine | Admitting: Internal Medicine

## 2014-03-18 ENCOUNTER — Encounter (HOSPITAL_COMMUNITY): Payer: Self-pay

## 2014-03-18 DIAGNOSIS — K449 Diaphragmatic hernia without obstruction or gangrene: Secondary | ICD-10-CM | POA: Diagnosis not present

## 2014-03-18 DIAGNOSIS — Z87891 Personal history of nicotine dependence: Secondary | ICD-10-CM | POA: Diagnosis not present

## 2014-03-18 DIAGNOSIS — G4700 Insomnia, unspecified: Secondary | ICD-10-CM | POA: Diagnosis not present

## 2014-03-18 DIAGNOSIS — M1990 Unspecified osteoarthritis, unspecified site: Secondary | ICD-10-CM | POA: Diagnosis not present

## 2014-03-18 DIAGNOSIS — F329 Major depressive disorder, single episode, unspecified: Secondary | ICD-10-CM | POA: Diagnosis not present

## 2014-03-18 DIAGNOSIS — Z885 Allergy status to narcotic agent status: Secondary | ICD-10-CM | POA: Diagnosis not present

## 2014-03-18 DIAGNOSIS — K2270 Barrett's esophagus without dysplasia: Secondary | ICD-10-CM | POA: Diagnosis not present

## 2014-03-18 DIAGNOSIS — K219 Gastro-esophageal reflux disease without esophagitis: Secondary | ICD-10-CM | POA: Diagnosis not present

## 2014-03-18 DIAGNOSIS — Z96641 Presence of right artificial hip joint: Secondary | ICD-10-CM | POA: Diagnosis not present

## 2014-03-18 DIAGNOSIS — I10 Essential (primary) hypertension: Secondary | ICD-10-CM | POA: Diagnosis not present

## 2014-03-18 DIAGNOSIS — Z91030 Bee allergy status: Secondary | ICD-10-CM | POA: Diagnosis not present

## 2014-03-18 HISTORY — DX: Anemia, unspecified: D64.9

## 2014-03-18 LAB — BASIC METABOLIC PANEL
Anion gap: 1 — ABNORMAL LOW (ref 5–15)
BUN: 19 mg/dL (ref 6–23)
CALCIUM: 8.8 mg/dL (ref 8.4–10.5)
CO2: 26 mmol/L (ref 19–32)
Chloride: 111 mmol/L (ref 96–112)
Creatinine, Ser: 0.94 mg/dL (ref 0.50–1.10)
GFR calc Af Amer: 71 mL/min — ABNORMAL LOW (ref >90)
GFR, EST NON AFRICAN AMERICAN: 61 mL/min — AB (ref >90)
Glucose, Bld: 101 mg/dL — ABNORMAL HIGH (ref 70–99)
Potassium: 3.9 mmol/L (ref 3.5–5.1)
SODIUM: 138 mmol/L (ref 135–145)

## 2014-03-18 LAB — CBC
HEMATOCRIT: 38.6 % (ref 36.0–46.0)
HEMOGLOBIN: 12.6 g/dL (ref 12.0–15.0)
MCH: 31 pg (ref 26.0–34.0)
MCHC: 32.6 g/dL (ref 30.0–36.0)
MCV: 95.1 fL (ref 78.0–100.0)
Platelets: 172 10*3/uL (ref 150–400)
RBC: 4.06 MIL/uL (ref 3.87–5.11)
RDW: 14.5 % (ref 11.5–15.5)
WBC: 8 10*3/uL (ref 4.0–10.5)

## 2014-03-18 NOTE — Progress Notes (Signed)
cc'ed to pcp °

## 2014-03-19 ENCOUNTER — Other Ambulatory Visit: Payer: Self-pay | Admitting: Family Medicine

## 2014-03-20 ENCOUNTER — Encounter (HOSPITAL_COMMUNITY)
Admission: RE | Disposition: A | Discharge status: Home / Self Care | Payer: Self-pay | Source: Ambulatory Visit | Attending: Internal Medicine

## 2014-03-20 ENCOUNTER — Ambulatory Visit (HOSPITAL_COMMUNITY)
Admission: RE | Admit: 2014-03-20 | Discharge: 2014-03-20 | Disposition: A | Discharge status: Home / Self Care | Payer: Medicare Other | Source: Ambulatory Visit | Attending: Internal Medicine | Admitting: Internal Medicine

## 2014-03-20 ENCOUNTER — Encounter (HOSPITAL_COMMUNITY): Payer: Self-pay | Admitting: *Deleted

## 2014-03-20 ENCOUNTER — Ambulatory Visit (HOSPITAL_COMMUNITY): Payer: Medicare Other | Admitting: Anesthesiology

## 2014-03-20 DIAGNOSIS — Z885 Allergy status to narcotic agent status: Secondary | ICD-10-CM | POA: Insufficient documentation

## 2014-03-20 DIAGNOSIS — M1990 Unspecified osteoarthritis, unspecified site: Secondary | ICD-10-CM | POA: Diagnosis not present

## 2014-03-20 DIAGNOSIS — K229 Disease of esophagus, unspecified: Secondary | ICD-10-CM | POA: Insufficient documentation

## 2014-03-20 DIAGNOSIS — Z96641 Presence of right artificial hip joint: Secondary | ICD-10-CM | POA: Insufficient documentation

## 2014-03-20 DIAGNOSIS — F329 Major depressive disorder, single episode, unspecified: Secondary | ICD-10-CM | POA: Diagnosis not present

## 2014-03-20 DIAGNOSIS — K219 Gastro-esophageal reflux disease without esophagitis: Secondary | ICD-10-CM | POA: Diagnosis not present

## 2014-03-20 DIAGNOSIS — K449 Diaphragmatic hernia without obstruction or gangrene: Secondary | ICD-10-CM | POA: Insufficient documentation

## 2014-03-20 DIAGNOSIS — G4700 Insomnia, unspecified: Secondary | ICD-10-CM | POA: Insufficient documentation

## 2014-03-20 DIAGNOSIS — I10 Essential (primary) hypertension: Secondary | ICD-10-CM | POA: Insufficient documentation

## 2014-03-20 DIAGNOSIS — K2270 Barrett's esophagus without dysplasia: Secondary | ICD-10-CM | POA: Insufficient documentation

## 2014-03-20 DIAGNOSIS — K2289 Other specified disease of esophagus: Secondary | ICD-10-CM | POA: Insufficient documentation

## 2014-03-20 DIAGNOSIS — Z91030 Bee allergy status: Secondary | ICD-10-CM | POA: Insufficient documentation

## 2014-03-20 DIAGNOSIS — Z87891 Personal history of nicotine dependence: Secondary | ICD-10-CM | POA: Insufficient documentation

## 2014-03-20 DIAGNOSIS — Z1210 Encounter for screening for malignant neoplasm of intestinal tract, unspecified: Secondary | ICD-10-CM | POA: Diagnosis not present

## 2014-03-20 HISTORY — PX: ESOPHAGOGASTRODUODENOSCOPY (EGD) WITH PROPOFOL: SHX5813

## 2014-03-20 HISTORY — PX: BIOPSY: SHX5522

## 2014-03-20 SURGERY — ESOPHAGOGASTRODUODENOSCOPY (EGD) WITH PROPOFOL
Anesthesia: Monitor Anesthesia Care

## 2014-03-20 MED ORDER — MIDAZOLAM HCL 2 MG/2ML IJ SOLN
INTRAMUSCULAR | Status: AC
  Filled 2014-03-20: qty 2

## 2014-03-20 MED ORDER — FENTANYL CITRATE 0.05 MG/ML IJ SOLN
25.0000 ug | INTRAMUSCULAR | Status: AC
  Administered 2014-03-20 (×2): 25 ug via INTRAVENOUS

## 2014-03-20 MED ORDER — FENTANYL CITRATE 0.05 MG/ML IJ SOLN
INTRAMUSCULAR | Status: AC
  Filled 2014-03-20: qty 2

## 2014-03-20 MED ORDER — PROPOFOL 10 MG/ML IV BOLUS
INTRAVENOUS | Status: AC
  Filled 2014-03-20: qty 20

## 2014-03-20 MED ORDER — ONDANSETRON HCL 4 MG/2ML IJ SOLN
INTRAMUSCULAR | Status: AC
  Filled 2014-03-20: qty 2

## 2014-03-20 MED ORDER — MIDAZOLAM HCL 2 MG/2ML IJ SOLN
1.0000 mg | INTRAMUSCULAR | Status: DC | PRN
  Administered 2014-03-20: 2 mg via INTRAVENOUS

## 2014-03-20 MED ORDER — LACTATED RINGERS IV SOLN
INTRAVENOUS | Status: DC
  Administered 2014-03-20: 1000 mL via INTRAVENOUS

## 2014-03-20 MED ORDER — LIDOCAINE VISCOUS 2 % MT SOLN
OROMUCOSAL | Status: AC
  Administered 2014-03-20: 4 mL via OROMUCOSAL
  Filled 2014-03-20: qty 15

## 2014-03-20 MED ORDER — LIDOCAINE VISCOUS 2 % MT SOLN
4.0000 mL | Freq: Once | OROMUCOSAL | Status: AC
  Administered 2014-03-20: 4 mL via OROMUCOSAL

## 2014-03-20 MED ORDER — ONDANSETRON HCL 4 MG/2ML IJ SOLN
4.0000 mg | Freq: Once | INTRAMUSCULAR | Status: AC
  Administered 2014-03-20: 4 mg via INTRAVENOUS

## 2014-03-20 MED ORDER — MIDAZOLAM HCL 5 MG/5ML IJ SOLN
INTRAMUSCULAR | Status: DC | PRN
  Administered 2014-03-20: 2 mg via INTRAVENOUS

## 2014-03-20 MED ORDER — PROPOFOL INFUSION 10 MG/ML OPTIME
INTRAVENOUS | Status: DC | PRN
  Administered 2014-03-20: 125 ug/kg/min via INTRAVENOUS

## 2014-03-20 MED ORDER — STERILE WATER FOR IRRIGATION IR SOLN
Status: DC | PRN
  Administered 2014-03-20: 1000 mL

## 2014-03-20 MED ORDER — ONDANSETRON HCL 4 MG/2ML IJ SOLN: 4.0000 mg | Freq: Once | INTRAMUSCULAR | Status: DC | PRN

## 2014-03-20 MED ORDER — FENTANYL CITRATE 0.05 MG/ML IJ SOLN: 25.0000 ug | INTRAMUSCULAR | Status: DC | PRN

## 2014-03-20 SURGICAL SUPPLY — 20 items
BLOCK BITE 60FR ADLT L/F BLUE (MISCELLANEOUS) ×3 IMPLANT
DEVICE CLIP HEMOSTAT 235CM (CLIP) IMPLANT
ELECT REM PT RETURN 9FT ADLT (ELECTROSURGICAL)
ELECTRODE REM PT RTRN 9FT ADLT (ELECTROSURGICAL) IMPLANT
FLOOR PAD 36X40 (MISCELLANEOUS) ×3
FORCEPS BIOP RAD 4 LRG CAP 4 (CUTTING FORCEPS) ×3 IMPLANT
FORMALIN 10 PREFIL 20ML (MISCELLANEOUS) ×3 IMPLANT
KIT CLEAN ENDO COMPLIANCE (KITS) ×3 IMPLANT
MANIFOLD NEPTUNE II (INSTRUMENTS) ×3 IMPLANT
NEEDLE SCLEROTHERAPY 25GX240 (NEEDLE) IMPLANT
PAD FLOOR 36X40 (MISCELLANEOUS) ×1 IMPLANT
PROBE APC STR FIRE (PROBE) IMPLANT
PROBE INJECTION GOLD (MISCELLANEOUS)
PROBE INJECTION GOLD 7FR (MISCELLANEOUS) IMPLANT
SNARE ROTATE MED OVAL 20MM (MISCELLANEOUS) IMPLANT
SNARE SHORT THROW 13M SML OVAL (MISCELLANEOUS) ×3 IMPLANT
SYR 50ML LL SCALE MARK (SYRINGE) ×3 IMPLANT
SYR INFLATION 60ML (SYRINGE) ×3 IMPLANT
TUBING IRRIGATION ENDOGATOR (MISCELLANEOUS) ×3 IMPLANT
WATER STERILE IRR 1000ML POUR (IV SOLUTION) ×3 IMPLANT

## 2014-03-20 NOTE — Interval H&P Note (Signed)
History and Physical Interval Note:  03/20/2014 9:31 AM  Renee Waters  has presented today for surgery, with the diagnosis of surveillance  The various methods of treatment have been discussed with the patient and family. After consideration of risks, benefits and other options for treatment, the patient has consented to  Procedure(s) with comments: ESOPHAGOGASTRODUODENOSCOPY (EGD) WITH PROPOFOL (N/A) - 945 as a surgical intervention .  The patient's history has been reviewed, patient examined, no change in status, stable for surgery.  I have reviewed the patient's chart and labs.  Questions were answered to the patient's satisfaction.     Manus Rudd  Patient seen and examined. EGD with the surveillance biopsies per plan.The risks, benefits, limitations, alternatives and imponderables have been reviewed with the patient. Potential for esophageal dilation, biopsy, etc. have also been reviewed.  Questions have been answered. All parties agreeable.

## 2014-03-20 NOTE — Anesthesia Preprocedure Evaluation (Signed)
Anesthesia Evaluation  Patient identified by MRN, date of birth, ID band Patient awake    Reviewed: Allergy & Precautions, H&P , NPO status , Patient's Chart, lab work & pertinent test results  History of Anesthesia Complications Negative for: history of anesthetic complications  Airway Mallampati: I       Dental  (+) Teeth Intact   Pulmonary former smoker,    Pulmonary exam normal       Cardiovascular hypertension, Pt. on medications Rhythm:Regular Rate:Normal     Neuro/Psych  Headaches, PSYCHIATRIC DISORDERS Depression    GI/Hepatic GERD-  Medicated and Controlled,  Endo/Other    Renal/GU      Musculoskeletal   Abdominal   Peds  Hematology  (+) anemia ,   Anesthesia Other Findings   Reproductive/Obstetrics                             Anesthesia Physical Anesthesia Plan  ASA: II  Anesthesia Plan: MAC   Post-op Pain Management:    Induction: Intravenous  Airway Management Planned: Simple Face Mask  Additional Equipment:   Intra-op Plan:   Post-operative Plan:   Informed Consent: I have reviewed the patients History and Physical, chart, labs and discussed the procedure including the risks, benefits and alternatives for the proposed anesthesia with the patient or authorized representative who has indicated his/her understanding and acceptance.     Plan Discussed with:   Anesthesia Plan Comments:         Anesthesia Quick Evaluation                                  Anesthesia Evaluation  Patient identified by MRN, date of birth, ID band Patient awake    Reviewed: Allergy & Precautions, H&P , NPO status , Patient's Chart, lab work & pertinent test results  Airway Mallampati: II TM Distance: >3 FB Neck ROM: full    Dental  (+) Caps and Dental Advisory Given Bridge cemented in all lower front:   Pulmonary neg pulmonary ROS, former smoker,  breath sounds  clear to auscultation  Pulmonary exam normal       Cardiovascular Exercise Tolerance: Good hypertension, Pt. on medications Rhythm:regular Rate:Normal     Neuro/Psych Anxiety Depression negative neurological ROS  negative psych ROS   GI/Hepatic negative GI ROS, Neg liver ROS, GERD-  Medicated and Controlled,  Endo/Other  negative endocrine ROS  Renal/GU negative Renal ROS  negative genitourinary   Musculoskeletal   Abdominal   Peds  Hematology negative hematology ROS (+)   Anesthesia Other Findings Systemic lupus  Reproductive/Obstetrics negative OB ROS                          Anesthesia Physical Anesthesia Plan  ASA: II  Anesthesia Plan: General   Post-op Pain Management:    Induction: Intravenous  Airway Management Planned: Oral ETT  Additional Equipment:   Intra-op Plan:   Post-operative Plan: Extubation in OR  Informed Consent: I have reviewed the patients History and Physical, chart, labs and discussed the procedure including the risks, benefits and alternatives for the proposed anesthesia with the patient or authorized representative who has indicated his/her understanding and acceptance.   Dental Advisory Given  Plan Discussed with: CRNA and Surgeon  Anesthesia Plan Comments:        Anesthesia Quick Evaluation

## 2014-03-20 NOTE — Discharge Instructions (Addendum)
EGD Discharge instructions Please read the instructions outlined below and refer to this sheet in the next few weeks. These discharge instructions provide you with general information on caring for yourself after you leave the hospital. Your doctor may also give you specific instructions. While your treatment has been planned according to the most current medical practices available, unavoidable complications occasionally occur. If you have any problems or questions after discharge, please call your doctor. ACTIVITY  You may resume your regular activity but move at a slower pace for the next 24 hours.   Take frequent rest periods for the next 24 hours.   Walking will help expel (get rid of) the air and reduce the bloated feeling in your abdomen.   No driving for 24 hours (because of the anesthesia (medicine) used during the test).   You may shower.   Do not sign any important legal documents or operate any machinery for 24 hours (because of the anesthesia used during the test).  NUTRITION  Drink plenty of fluids.   You may resume your normal diet.   Begin with a light meal and progress to your normal diet.   Avoid alcoholic beverages for 24 hours or as instructed by your caregiver.  MEDICATIONS  You may resume your normal medications unless your caregiver tells you otherwise.  WHAT YOU CAN EXPECT TODAY  You may experience abdominal discomfort such as a feeling of fullness or gas pains.  FOLLOW-UP  Your doctor will discuss the results of your test with you.  SEEK IMMEDIATE MEDICAL ATTENTION IF ANY OF THE FOLLOWING OCCUR:  Excessive nausea (feeling sick to your stomach) and/or vomiting.   Severe abdominal pain and distention (swelling).   Trouble swallowing.   Temperature over 101 F (37.8 C).   Rectal bleeding or vomiting of blood.    GERD information provided  Continue Prilosec 20 mg every day  Further recommendations to follow pending review of pathology  report  Gastroesophageal Reflux Disease, Adult Gastroesophageal reflux disease (GERD) happens when acid from your stomach flows up into the esophagus. When acid comes in contact with the esophagus, the acid causes soreness (inflammation) in the esophagus. Over time, GERD may create small holes (ulcers) in the lining of the esophagus. CAUSES  Increased body weight. This puts pressure on the stomach, making acid rise from the stomach into the esophagus. Smoking. This increases acid production in the stomach. Drinking alcohol. This causes decreased pressure in the lower esophageal sphincter (valve or ring of muscle between the esophagus and stomach), allowing acid from the stomach into the esophagus. Late evening meals and a full stomach. This increases pressure and acid production in the stomach. A malformed lower esophageal sphincter. Sometimes, no cause is found. SYMPTOMS  Burning pain in the lower part of the mid-chest behind the breastbone and in the mid-stomach area. This may occur twice a week or more often. Trouble swallowing. Sore throat. Dry cough. Asthma-like symptoms including chest tightness, shortness of breath, or wheezing. DIAGNOSIS  Your caregiver may be able to diagnose GERD based on your symptoms. In some cases, X-rays and other tests may be done to check for complications or to check the condition of your stomach and esophagus. TREATMENT  Your caregiver may recommend over-the-counter or prescription medicines to help decrease acid production. Ask your caregiver before starting or adding any new medicines.  HOME CARE INSTRUCTIONS  Change the factors that you can control. Ask your caregiver for guidance concerning weight loss, quitting smoking, and alcohol consumption. Avoid  foods and drinks that make your symptoms worse, such as: Caffeine or alcoholic drinks. Chocolate. Peppermint or mint flavorings. Garlic and onions. Spicy foods. Citrus fruits, such as oranges, lemons,  or limes. Tomato-based foods such as sauce, chili, salsa, and pizza. Fried and fatty foods. Avoid lying down for the 3 hours prior to your bedtime or prior to taking a nap. Eat small, frequent meals instead of large meals. Wear loose-fitting clothing. Do not wear anything tight around your waist that causes pressure on your stomach. Raise the head of your bed 6 to 8 inches with wood blocks to help you sleep. Extra pillows will not help. Only take over-the-counter or prescription medicines for pain, discomfort, or fever as directed by your caregiver. Do not take aspirin, ibuprofen, or other nonsteroidal anti-inflammatory drugs (NSAIDs). SEEK IMMEDIATE MEDICAL CARE IF:  You have pain in your arms, neck, jaw, teeth, or back. Your pain increases or changes in intensity or duration. You develop nausea, vomiting, or sweating (diaphoresis). You develop shortness of breath, or you faint. Your vomit is green, yellow, black, or looks like coffee grounds or blood. Your stool is red, bloody, or black. These symptoms could be signs of other problems, such as heart disease, gastric bleeding, or esophageal bleeding. MAKE SURE YOU:  Understand these instructions. Will watch your condition. Will get help right away if you are not doing well or get worse. Document Released: 11/03/2004 Document Revised: 04/18/2011 Document Reviewed: 08/13/2010 [facility] Patient Information 2015 [place], [place]. This information is not intended to replace advice given to you by your health care provider. Make sure you discuss any questions you have with your health care provider.

## 2014-03-20 NOTE — H&P (View-Only) (Signed)
Primary Care Physician:  Tula Nakayama, MD  Primary Gastroenterologist:  Garfield Cornea, MD   Chief Complaint  Patient presents with  . recall for EGD    HPI:  Renee Waters is a 69 y.o. female here schedule surveillance EGD for history of Barrett's esophagus. Last EGD was in 2012. Overall she's been doing well from a GI standpoint. Rarely has heartburn. Tells me that she takes omeprazole only when she has heartburn. Denies dysphagia, nausea or vomiting, unintentional weight loss, abdominal pain, constipation, diarrhea, melena, rectal bleeding.  Current Outpatient Prescriptions  Medication Sig Dispense Refill  . acyclovir (ZOVIRAX) 800 MG tablet TAKE ONE TABLET BY MOUTH 4 TIMES DAILY AS NEEDED FOR BREAKOUTS 30 tablet 0  . ALPRAZolam (XANAX) 0.25 MG tablet TAKE ONE TABLET BY MOUTH ONCE DAILY AS NEEDED FOR EXCESSIVE ANXIETY 20 tablet 2  . busPIRone (BUSPAR) 7.5 MG tablet Take 1 tablet (7.5 mg total) by mouth 3 (three) times daily. 90 tablet 3  . citalopram (CELEXA) 20 MG tablet Take 1 tablet (20 mg total) by mouth daily. 30 tablet 4  . cloNIDine (CATAPRES) 0.3 MG tablet Take 1 tablet (0.3 mg total) by mouth 2 (two) times daily. 60 tablet 3  . ergocalciferol (VITAMIN D2) 50000 UNITS capsule One capsule once weekly 4 capsule 5  . gabapentin (NEURONTIN) 600 MG tablet Take 600 mg by mouth 3 (three) times daily.    . Multiple Vitamins-Minerals (MULTIVITAMINS THER. W/MINERALS) TABS Take 1 tablet by mouth daily.     Marland Kitchen omeprazole (PRILOSEC) 40 MG capsule Take 40 mg by mouth daily.    . Oxycodone HCl 10 MG TABS Take 10 mg by mouth. 1 tab every 6 hours as needed    . SUMAtriptan (IMITREX) 100 MG tablet One tablet at headache onset. May repeat once after 2 hours if headache persists.  Maximum of two tablets in 24 hours 10 tablet 0  . temazepam (RESTORIL) 30 MG capsule Take 1 capsule (30 mg total) by mouth at bedtime as needed. for sleep 30 capsule 3  . tiZANidine (ZANAFLEX) 4 MG tablet Take 1  tablet (4 mg total) by mouth at bedtime. 30 tablet 5  . topiramate (TOPAMAX) 50 MG tablet Take 1 tablet (50 mg total) by mouth 2 (two) times daily. 60 tablet 4  . ferrous sulfate 325 (65 FE) MG EC tablet Take 325 mg by mouth daily with breakfast.    . polyethylene glycol (MIRALAX / GLYCOLAX) packet Take 17 g by mouth 2 (two) times daily. 14 each 0   No current facility-administered medications for this visit.    Allergies as of 03/14/2014 - Review Complete 03/14/2014  Allergen Reaction Noted  . Tylox [oxycodone-acetaminophen] Other (See Comments) 09/27/2010  . Bee venom Swelling 06/12/2012    Past Medical History  Diagnosis Date  . Insomnia   . Chronic back pain   . Chronic neck pain   . Depression   . GERD (gastroesophageal reflux disease)   . Osteoarthritis   . Genital herpes   . Hypertension   . S/P colonoscopy June 2005    normal, no polyps  . S/P endoscopy June 2005, Oct 2009    2005: short-segment Barrett's, 2009: short-segment Barrett's  . Barrett's esophagus   . UTI (lower urinary tract infection) 10/14    currently on Penicillin-  states is improving  . History of blood transfusion     Past Surgical History  Procedure Laterality Date  . Cervical disectomy  2002  . Shoulder arthroscopy  2008  left  . Right hip replacement  07/2010    went back in sept 2012 to fix  . Hernia repair  07/2010    Dr. Zada Girt  . Abdominal hysterectomy    . Esophagogastroduodenoscopy  11/29/2007    salmon-colored  tongue   longest stable at  3 cm, distal esophagus as described previously status post biopsy/ Hiatal hernia, otherwise normal stomach D1 and D2  . Esophagogastroduodenoscopy  01/06/11    short segment Barrett's esophagus s/p bx/Hiatal hernia  . Colonoscopy  11/09/2011    RMR: Melanosis coli  . Total hip revision Right 12/17/2012    Procedure: RIGHT TOTAL HIP REVISION;  Surgeon: Mauri Pole, MD;  Location: [facility];  Service: Orthopedics;  Laterality: Right;     Family History  Problem Relation Age of Onset  . Hypertension Mother   . Colon cancer Neg Hx   . Anesthesia problems Neg Hx   . Hypotension Neg Hx   . Malignant hyperthermia Neg Hx   . Pseudochol deficiency Neg Hx     History   Social History  . Marital Status: Married    Spouse Name: N/A    Number of Children: N/A  . Years of Education: N/A   Occupational History  . Not on file.   Social History Main Topics  . Smoking status: Former Smoker    Types: Cigarettes  . Smokeless tobacco: Never Used     Comment: quit in 2004  . Alcohol Use: Yes     Comment: socially, rare  . Drug Use: No  . Sexual Activity: Not on file   Other Topics Concern  . Not on file   Social History Narrative      ROS:  General: Negative for anorexia, weight loss, fever, chills, fatigue, weakness. Eyes: Negative for vision changes.  ENT: Negative for hoarseness, difficulty swallowing , nasal congestion. CV: Negative for chest pain, angina, palpitations, dyspnea on exertion, peripheral edema.  Respiratory: Negative for dyspnea at rest, dyspnea on exertion, cough, sputum, wheezing.  GI: See history of present illness. GU:  Negative for dysuria, hematuria, urinary incontinence, urinary frequency, nocturnal urination.  MS: Chronic pain syndrome. Mostly back, well controlled.  Derm: Negative for rash or itching.  Neuro: Negative for weakness, abnormal sensation, seizure, frequent headaches, memory loss, confusion.  Psych: Negative for anxiety, depression, suicidal ideation, hallucinations.  Endo: Negative for unusual weight change.  Heme: Negative for bruising or bleeding. Allergy: Negative for rash or hives.    Physical Examination:  BP 112/68 mmHg  Pulse 61  Temp(Src) 97.5 F (36.4 C)  Ht 5\' 5"  (1.651 m)  Wt 131 lb 3.2 oz (59.512 kg)  BMI 21.83 kg/m2   General: Well-nourished, well-developed in no acute distress.  Head: Normocephalic, atraumatic.   Eyes: Conjunctiva pink, no  icterus. Mouth: Oropharyngeal mucosa moist and pink , no lesions erythema or exudate. Neck: Supple without thyromegaly, masses, or lymphadenopathy.  Lungs: Clear to auscultation bilaterally.  Heart: Regular rate and rhythm, no murmurs rubs or gallops.  Abdomen: Bowel sounds are normal, nontender, nondistended, no hepatosplenomegaly or masses, no abdominal bruits or    hernia , no rebound or guarding.   Rectal: Not performed Extremities: No lower extremity edema. No clubbing or deformities.  Neuro: Alert and oriented x 4 , grossly normal neurologically.  Skin: Warm and dry, no rash or jaundice.   Psych: Alert and cooperative, normal mood and affect.  Labs: Lab Results  Component Value Date   WBC 4.2 03/21/2013  HGB 14.3 03/21/2013   HCT 43.6 03/21/2013   MCV 93.4 03/21/2013   PLT 153 03/21/2013   Lab Results  Component Value Date   CREATININE 0.92 07/16/2013   BUN 22 07/16/2013   NA 138 07/16/2013   K 4.1 07/16/2013   CL 106 07/16/2013   CO2 25 07/16/2013      Imaging Studies: Mm Digital Screening  03/13/2014   CLINICAL DATA:  Screening.  EXAM: DIGITAL SCREENING BILATERAL MAMMOGRAM WITH CAD  COMPARISON:  Previous exam(s).  ACR Breast Density Category b: There are scattered areas of fibroglandular density.  FINDINGS: There are no findings suspicious for malignancy. Images were processed with CAD.  IMPRESSION: No mammographic evidence of malignancy. A result letter of this screening mammogram will be mailed directly to the patient.  RECOMMENDATION: Screening mammogram in one year. (Code:SM-B-01Y)  BI-RADS CATEGORY  1: Negative.   Electronically Signed   By: Evangeline Dakin M.D.   On: 03/13/2014 08:14

## 2014-03-20 NOTE — Transfer of Care (Signed)
Immediate Anesthesia Transfer of Care Note  Patient: Renee Waters  Procedure(s) Performed: Procedure(s) with comments: ESOPHAGOGASTRODUODENOSCOPY (EGD) WITH PROPOFOL (N/A) - 945  Patient Location: PACU  Anesthesia Type:MAC  Level of Consciousness: awake and patient cooperative  Airway & Oxygen Therapy: Patient Spontanous Breathing and Patient connected to face mask oxygen  Post-op Assessment: Report given to RN, Post -op Vital signs reviewed and stable and Patient moving all extremities  Post vital signs: Reviewed and stable  Last Vitals:  Filed Vitals:   03/20/14 0840  Temp: 73.5 C    Complications: No apparent anesthesia complications

## 2014-03-20 NOTE — Op Note (Signed)
[facility] [address] [place], [address]   ENDOSCOPY PROCEDURE REPORT  PATIENT: Renee Waters, Renee Waters  MR#: [id number] BIRTHDATE: Jan 08, 1946 , 61  yrs. old GENDER: female ENDOSCOPIST: R.  Garfield Cornea, MD FACP FACG REFERRED BY:  Tula Nakayama, M.D. PROCEDURE DATE:  April 03, 2014 PROCEDURE:  EGD w/ biopsy INDICATIONS:  history of known Barrett's esophagus; surveillance examination. MEDICATIONS: Deep sedation per Dr.  [facility] ASA CLASS:      Class II  CONSENT: The risks, benefits, limitations, alternatives and imponderables have been discussed.  The potential for biopsy, esophogeal dilation, etc. have also been reviewed.  Questions have been answered.  All parties agreeable.  Please see the history and physical in the medical record for more information.  DESCRIPTION OF PROCEDURE: After the risks benefits and alternatives of the procedure were thoroughly explained, informed consent was obtained.  The    endoscope was introduced through the mouth and advanced to the second portion of the duodenum , limited by Without limitations. The instrument was slowly withdrawn as the mucosa was fully examined.    Abnormal distal esophageal mucosa consistent with prior diagnosis of Barrett's esophagus;  salmon-colored epithelium coming up about 11/2-2 cm from the GE junction circumferentially with a single tongue coming up to 3 cm.  There was no esophagitis.  No nodularity.  EG junction easily traversed.  Stomach empty.  Normal gastric mucosa.  Small hiatal hernia.  Patent pylorus. Normal-appearing first and second portion of the duodenum.  Biopsies of the abnormal distal esophagus taken for histologic study.  Retroflexed views revealed no abnormalities and Retroflexed views revealed as previously described.     The scope was then withdrawn from the patient and the procedure completed.  COMPLICATIONS: There were no immediate complications.  ENDOSCOPIC  IMPRESSION: Abnormal distal esophagus - stable in appearance from prior examination representing short segment Barrett's esophagus?"status post biopsy. Hiatal hernia.  RECOMMENDATIONS: Continue Prilosec daily. Patient should take this medication every day indefinitely whether she perceives symptoms of reflux or not. Follow up on pathology.  REPEAT EXAM:  eSigned:  R. Garfield Cornea, MD Rosal[facility] 03-Apr-2014 10:22 AM    CC:  CPT CODES: ICD CODES:  The ICD and CPT codes recommended by this software are interpretations from the data that the clinical staff has captured with the software.  The verification of the translation of this report to the ICD and CPT codes and modifiers is the sole responsibility of the health care institution and practicing physician where this report was generated.  [employer] [place]. will not be held responsible for the validity of the ICD and CPT codes included on this report.  AMA assumes no liability for data contained or not contained herein. CPT is a Designer, television/film set of the [employer].

## 2014-03-20 NOTE — Anesthesia Postprocedure Evaluation (Signed)
  Anesthesia Post-op Note  Patient: Renee Waters  Procedure(s) Performed: Procedure(s) with comments: ESOPHAGOGASTRODUODENOSCOPY (EGD) WITH PROPOFOL (N/A) - 945  Patient Location: PACU  Anesthesia Type:MAC  Level of Consciousness: awake, alert , oriented and patient cooperative  Airway and Oxygen Therapy: Patient Spontanous Breathing  Post-op Pain: none  Post-op Assessment: Post-op Vital signs reviewed, Patient's Cardiovascular Status Stable, Respiratory Function Stable, Patent Airway, No signs of Nausea or vomiting and Adequate PO intake  Post-op Vital Signs: Reviewed and stable  Last Vitals:  Filed Vitals:   03/20/14 0840  Temp: 63.8 C    Complications: No apparent anesthesia complications

## 2014-03-21 ENCOUNTER — Encounter (HOSPITAL_COMMUNITY): Payer: Self-pay | Admitting: Internal Medicine

## 2014-03-21 ENCOUNTER — Telehealth: Payer: Self-pay | Admitting: Family Medicine

## 2014-03-21 MED ORDER — BUSPIRONE HCL 7.5 MG PO TABS: 7.5000 mg | ORAL_TABLET | Freq: Three times a day (TID) | ORAL | Status: DC

## 2014-03-21 NOTE — Telephone Encounter (Signed)
meds called in.

## 2014-03-25 ENCOUNTER — Encounter: Payer: Self-pay | Admitting: Internal Medicine

## 2014-03-31 DIAGNOSIS — Z87891 Personal history of nicotine dependence: Secondary | ICD-10-CM | POA: Diagnosis not present

## 2014-03-31 DIAGNOSIS — Z981 Arthrodesis status: Secondary | ICD-10-CM | POA: Diagnosis not present

## 2014-03-31 DIAGNOSIS — M1990 Unspecified osteoarthritis, unspecified site: Secondary | ICD-10-CM | POA: Diagnosis not present

## 2014-03-31 DIAGNOSIS — F419 Anxiety disorder, unspecified: Secondary | ICD-10-CM | POA: Diagnosis not present

## 2014-03-31 DIAGNOSIS — Z79899 Other long term (current) drug therapy: Secondary | ICD-10-CM | POA: Diagnosis not present

## 2014-03-31 DIAGNOSIS — G5600 Carpal tunnel syndrome, unspecified upper limb: Secondary | ICD-10-CM | POA: Diagnosis not present

## 2014-03-31 DIAGNOSIS — G8929 Other chronic pain: Secondary | ICD-10-CM | POA: Diagnosis not present

## 2014-03-31 DIAGNOSIS — M47816 Spondylosis without myelopathy or radiculopathy, lumbar region: Secondary | ICD-10-CM | POA: Diagnosis not present

## 2014-03-31 DIAGNOSIS — Z885 Allergy status to narcotic agent status: Secondary | ICD-10-CM | POA: Diagnosis not present

## 2014-03-31 DIAGNOSIS — Z96641 Presence of right artificial hip joint: Secondary | ICD-10-CM | POA: Diagnosis not present

## 2014-03-31 DIAGNOSIS — M25551 Pain in right hip: Secondary | ICD-10-CM | POA: Diagnosis not present

## 2014-03-31 DIAGNOSIS — Z7982 Long term (current) use of aspirin: Secondary | ICD-10-CM | POA: Diagnosis not present

## 2014-03-31 DIAGNOSIS — M5136 Other intervertebral disc degeneration, lumbar region: Secondary | ICD-10-CM | POA: Diagnosis not present

## 2014-03-31 DIAGNOSIS — I10 Essential (primary) hypertension: Secondary | ICD-10-CM | POA: Diagnosis not present

## 2014-03-31 DIAGNOSIS — M9983 Other biomechanical lesions of lumbar region: Secondary | ICD-10-CM | POA: Diagnosis not present

## 2014-03-31 DIAGNOSIS — F329 Major depressive disorder, single episode, unspecified: Secondary | ICD-10-CM | POA: Diagnosis not present

## 2014-03-31 DIAGNOSIS — G4700 Insomnia, unspecified: Secondary | ICD-10-CM | POA: Diagnosis not present

## 2014-04-07 ENCOUNTER — Other Ambulatory Visit: Payer: Self-pay | Admitting: Family Medicine

## 2014-04-15 ENCOUNTER — Other Ambulatory Visit: Payer: Self-pay | Admitting: Family Medicine

## 2014-04-17 ENCOUNTER — Other Ambulatory Visit: Payer: Self-pay

## 2014-04-29 DIAGNOSIS — Z79899 Other long term (current) drug therapy: Secondary | ICD-10-CM | POA: Diagnosis not present

## 2014-04-29 DIAGNOSIS — I10 Essential (primary) hypertension: Secondary | ICD-10-CM | POA: Diagnosis not present

## 2014-04-29 DIAGNOSIS — M9983 Other biomechanical lesions of lumbar region: Secondary | ICD-10-CM | POA: Diagnosis not present

## 2014-04-29 DIAGNOSIS — Z96641 Presence of right artificial hip joint: Secondary | ICD-10-CM | POA: Diagnosis not present

## 2014-04-29 DIAGNOSIS — G8929 Other chronic pain: Secondary | ICD-10-CM | POA: Diagnosis not present

## 2014-04-29 DIAGNOSIS — M1990 Unspecified osteoarthritis, unspecified site: Secondary | ICD-10-CM | POA: Diagnosis not present

## 2014-04-29 DIAGNOSIS — Z888 Allergy status to other drugs, medicaments and biological substances status: Secondary | ICD-10-CM | POA: Diagnosis not present

## 2014-04-29 DIAGNOSIS — Z87891 Personal history of nicotine dependence: Secondary | ICD-10-CM | POA: Diagnosis not present

## 2014-04-29 DIAGNOSIS — Z7982 Long term (current) use of aspirin: Secondary | ICD-10-CM | POA: Diagnosis not present

## 2014-04-29 DIAGNOSIS — G4700 Insomnia, unspecified: Secondary | ICD-10-CM | POA: Diagnosis not present

## 2014-04-29 DIAGNOSIS — Z981 Arthrodesis status: Secondary | ICD-10-CM | POA: Diagnosis not present

## 2014-04-29 DIAGNOSIS — M25551 Pain in right hip: Secondary | ICD-10-CM | POA: Diagnosis not present

## 2014-04-29 DIAGNOSIS — G5600 Carpal tunnel syndrome, unspecified upper limb: Secondary | ICD-10-CM | POA: Diagnosis not present

## 2014-04-29 DIAGNOSIS — F329 Major depressive disorder, single episode, unspecified: Secondary | ICD-10-CM | POA: Diagnosis not present

## 2014-04-29 DIAGNOSIS — M47816 Spondylosis without myelopathy or radiculopathy, lumbar region: Secondary | ICD-10-CM | POA: Diagnosis not present

## 2014-04-29 DIAGNOSIS — M5136 Other intervertebral disc degeneration, lumbar region: Secondary | ICD-10-CM | POA: Diagnosis not present

## 2014-04-29 DIAGNOSIS — F419 Anxiety disorder, unspecified: Secondary | ICD-10-CM | POA: Diagnosis not present

## 2014-05-05 ENCOUNTER — Other Ambulatory Visit: Payer: Self-pay

## 2014-05-05 MED ORDER — BUTALBITAL-APAP-CAFFEINE 50-325-40 MG PO TABS: ORAL_TABLET | ORAL | Status: DC

## 2014-05-28 DIAGNOSIS — M545 Low back pain: Secondary | ICD-10-CM | POA: Diagnosis not present

## 2014-05-28 DIAGNOSIS — M47816 Spondylosis without myelopathy or radiculopathy, lumbar region: Secondary | ICD-10-CM | POA: Diagnosis not present

## 2014-05-28 DIAGNOSIS — M5136 Other intervertebral disc degeneration, lumbar region: Secondary | ICD-10-CM | POA: Diagnosis not present

## 2014-05-28 DIAGNOSIS — M9983 Other biomechanical lesions of lumbar region: Secondary | ICD-10-CM | POA: Diagnosis not present

## 2014-05-29 ENCOUNTER — Other Ambulatory Visit: Payer: Self-pay | Admitting: Family Medicine

## 2014-06-17 DIAGNOSIS — E559 Vitamin D deficiency, unspecified: Secondary | ICD-10-CM | POA: Diagnosis not present

## 2014-06-17 DIAGNOSIS — Z79899 Other long term (current) drug therapy: Secondary | ICD-10-CM | POA: Diagnosis not present

## 2014-06-17 DIAGNOSIS — I10 Essential (primary) hypertension: Secondary | ICD-10-CM | POA: Diagnosis not present

## 2014-06-25 DIAGNOSIS — M5136 Other intervertebral disc degeneration, lumbar region: Secondary | ICD-10-CM | POA: Diagnosis not present

## 2014-06-25 DIAGNOSIS — M47816 Spondylosis without myelopathy or radiculopathy, lumbar region: Secondary | ICD-10-CM | POA: Diagnosis not present

## 2014-06-25 DIAGNOSIS — M9983 Other biomechanical lesions of lumbar region: Secondary | ICD-10-CM | POA: Diagnosis not present

## 2014-06-25 DIAGNOSIS — M545 Low back pain: Secondary | ICD-10-CM | POA: Diagnosis not present

## 2014-06-26 ENCOUNTER — Encounter: Payer: Self-pay | Admitting: Family Medicine

## 2014-06-27 DIAGNOSIS — M545 Low back pain: Secondary | ICD-10-CM | POA: Diagnosis not present

## 2014-06-27 DIAGNOSIS — M329 Systemic lupus erythematosus, unspecified: Secondary | ICD-10-CM | POA: Diagnosis not present

## 2014-06-27 DIAGNOSIS — M1990 Unspecified osteoarthritis, unspecified site: Secondary | ICD-10-CM | POA: Diagnosis not present

## 2014-06-27 DIAGNOSIS — M79643 Pain in unspecified hand: Secondary | ICD-10-CM | POA: Diagnosis not present

## 2014-07-03 ENCOUNTER — Ambulatory Visit (INDEPENDENT_AMBULATORY_CARE_PROVIDER_SITE_OTHER): Payer: Medicare Other | Admitting: Family Medicine

## 2014-07-03 ENCOUNTER — Encounter: Payer: Self-pay | Admitting: Family Medicine

## 2014-07-03 VITALS — BP 136/84 | HR 62 | Resp 18 | Ht 65.0 in | Wt 128.1 lb

## 2014-07-03 DIAGNOSIS — F418 Other specified anxiety disorders: Secondary | ICD-10-CM | POA: Diagnosis not present

## 2014-07-03 DIAGNOSIS — Z0000 Encounter for general adult medical examination without abnormal findings: Secondary | ICD-10-CM | POA: Diagnosis not present

## 2014-07-03 DIAGNOSIS — M5440 Lumbago with sciatica, unspecified side: Secondary | ICD-10-CM

## 2014-07-03 DIAGNOSIS — M542 Cervicalgia: Secondary | ICD-10-CM

## 2014-07-03 MED ORDER — METHYLPREDNISOLONE ACETATE 80 MG/ML IJ SUSP
80.0000 mg | Freq: Once | INTRAMUSCULAR | Status: AC
  Administered 2014-07-03: 80 mg via INTRAMUSCULAR

## 2014-07-03 MED ORDER — KETOROLAC TROMETHAMINE 60 MG/2ML IM SOLN
60.0000 mg | Freq: Once | INTRAMUSCULAR | Status: AC
  Administered 2014-07-03: 60 mg via INTRAMUSCULAR

## 2014-07-03 MED ORDER — ALPRAZOLAM 0.25 MG PO TABS
ORAL_TABLET | ORAL | Status: DC
Start: 2014-07-03 — End: 2014-10-29

## 2014-07-03 MED ORDER — PREDNISONE 5 MG PO TABS: 5.0000 mg | ORAL_TABLET | Freq: Two times a day (BID) | ORAL | Status: DC

## 2014-07-03 MED ORDER — CITALOPRAM HYDROBROMIDE 40 MG PO TABS
40.0000 mg | ORAL_TABLET | Freq: Every day | ORAL | Status: DC
Start: 2014-07-03 — End: 2014-12-11

## 2014-07-03 NOTE — Patient Instructions (Addendum)
Annual physical exam in 5 month, call if you need me  Before  Increase in dose of citalopram to 40 mg daily, OK to take two 20 mg tablets daily  Labs are excellent  Injections and prednisone  for back pain   BP is great  Thanks for choosing [facility], we consider it a privelige to serve you.

## 2014-07-03 NOTE — Progress Notes (Signed)
Subjective:    Patient ID: Renee Waters, female    DOB: 1945-07-22, 69 y.o.   MRN: [id number]  HPI Preventive Screening-Counseling & Management   Patient present here today for a Medicare annual wellness visit.   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  Yes Over the past two weeks, have you felt little interest or pleasure in doing things? Yes Have you lost interest or pleasure in daily life? Yes Do you often feel hopeless? Yes due to health problems Do you cry easily over simple problems? Yes recent loss of friend  Activities of Daily Living  In your present state of health, do you have any difficulty performing the following activities?  Driving?: No Managing money?: No Feeding yourself?:No Getting from bed to chair?:No Climbing a flight of stairs?:  Yes but attempts to for strengthening  Preparing food and eating?:No Bathing or showering?:No Getting dressed?:No Getting to the toilet?:No Using the toilet?:No Moving around from place to place?: Yes, ambulates with cane Fall Risk Assessment In the past year have you fallen or had a near fall?:No Are you currently taking any medications that make you dizzy?:No   Hearing Difficulties: No Do you often ask people to speak up or repeat themselves?:No Do you experience ringing or noises in your ears?:No Do you have difficulty understanding soft or whispered voices?:No  Cognitive Testing  Alert? Yes Normal  Appearance?Yes  Oriented to person? Yes Place? Yes  Time? Yes  Displays appropriate judgment?Yes  Can read the correct time from a watch face? yes Are you having problems remembering things?  Yes some short term memory problems  Advanced Directives have been discussed with the patient?Yes and brochure provided   List the Names of Other Physician/Practitioners you currently use: updated    Indicate any recent Medical Services you may have received from other than [facility] providers in the past year (date may be approximate).   Assessment:    Annual Wellness Exam   Plan:    Medicare Attestation  I have personally reviewed:  The patient's medical and social history  Their use of alcohol, tobacco or illicit drugs  Their current medications and supplements  The patient's functional ability including ADLs,fall risks, home safety risks, cognitive, and hearing and visual impairment  Diet and physical activities  Evidence for depression or mood disorders  The patient's weight, height, BMI, and visual acuity have been recorded in the chart. I have made referrals, counseling, and provided education to the patient based on review of the above and I have provided the patient with a written personalized care plan for preventive services.      Review of Systems     Objective:   Physical Exam BP 136/84 mmHg  Pulse 62  Resp 18  Ht 5\' 5"  (1.651 m)  Wt 128 lb 1.9 oz (58.115 kg)  BMI 21.32 kg/m2  SpO2 99%   Patient tearful, in mental and physical pain  MS: decreased ROM lumbar spine with  spasm, full ROM of all 4 extremities       Assessment & Plan:  Medicare annual wellness visit, subsequent Annual exam as documented. Counseling done  re healthy lifestyle involving commitment to 150 minutes exercise per week, heart healthy diet, and attaining healthy weight.The importance of adequate sleep also discussed. Regular seat belt use and home safety, is also discussed. Changes in health  habits are decided on by the patient with goals and time frames  set for achieving them. Immunization and cancer screening needs are specifically addressed at this visit.    Depression with anxiety Increase citalopram dose. Pt encouraged to go to therapy again, chronic ill health and recent illness in spouse, skin cancer as well as family stressors all point to benefit, continues to refuse. Not suicidal or homicidal    Backache Uncontrolled.Toradol and depo medrol administered IM in the office , to be followed by a short course of oral prednisone .

## 2014-07-07 ENCOUNTER — Encounter: Payer: Self-pay | Admitting: Family Medicine

## 2014-07-07 DIAGNOSIS — Z0000 Encounter for general adult medical examination without abnormal findings: Secondary | ICD-10-CM | POA: Insufficient documentation

## 2014-07-07 NOTE — Assessment & Plan Note (Signed)

## 2014-07-07 NOTE — Assessment & Plan Note (Addendum)
Increase citalopram dose. Pt encouraged to go to therapy again, chronic ill health and recent illness in spouse, skin cancer as well as family stressors all point to benefit, continues to refuse. Not suicidal or homicidal

## 2014-07-07 NOTE — Assessment & Plan Note (Signed)
Uncontrolled.Toradol and depo medrol administered IM in the office , to be followed by a short course of oral prednisone   

## 2014-07-16 DIAGNOSIS — M47816 Spondylosis without myelopathy or radiculopathy, lumbar region: Secondary | ICD-10-CM | POA: Diagnosis not present

## 2014-07-16 DIAGNOSIS — M545 Low back pain: Secondary | ICD-10-CM | POA: Diagnosis not present

## 2014-07-16 DIAGNOSIS — M5136 Other intervertebral disc degeneration, lumbar region: Secondary | ICD-10-CM | POA: Diagnosis not present

## 2014-07-16 DIAGNOSIS — M9983 Other biomechanical lesions of lumbar region: Secondary | ICD-10-CM | POA: Diagnosis not present

## 2014-07-24 ENCOUNTER — Other Ambulatory Visit: Payer: Self-pay | Admitting: Family Medicine

## 2014-08-06 ENCOUNTER — Telehealth: Payer: Self-pay | Admitting: Family Medicine

## 2014-08-10 ENCOUNTER — Telehealth: Payer: Self-pay | Admitting: Family Medicine

## 2014-08-11 NOTE — Telephone Encounter (Signed)
pls order prednisone 5 mg dose pack and let pt know

## 2014-08-12 ENCOUNTER — Telehealth: Payer: Self-pay | Admitting: Family Medicine

## 2014-08-12 MED ORDER — PREDNISONE 5 MG (21) PO TBPK: 5.0000 mg | ORAL_TABLET | ORAL | Status: DC

## 2014-08-12 NOTE — Telephone Encounter (Signed)
See previous message

## 2014-08-12 NOTE — Telephone Encounter (Signed)
Called and left message for patient to return call.  

## 2014-08-12 NOTE — Telephone Encounter (Signed)
Pt may also be offered toradol 60mg  IM only, had depo medrol 1 month ago, i will enter the dose pack and send to [employer]

## 2014-08-12 NOTE — Telephone Encounter (Signed)
See next telephone message with response from md

## 2014-08-12 NOTE — Telephone Encounter (Signed)
Patient aware.  Refused injection at this time.

## 2014-08-16 ENCOUNTER — Other Ambulatory Visit: Payer: Self-pay | Admitting: Family Medicine

## 2014-09-02 ENCOUNTER — Other Ambulatory Visit: Payer: Self-pay | Admitting: Family Medicine

## 2014-09-09 ENCOUNTER — Telehealth: Payer: Self-pay | Admitting: *Deleted

## 2014-09-09 DIAGNOSIS — W57XXXA Bitten or stung by nonvenomous insect and other nonvenomous arthropods, initial encounter: Secondary | ICD-10-CM

## 2014-09-09 NOTE — Telephone Encounter (Signed)
Pt called requesting a lime test, pt wants the nurse to call her back.

## 2014-09-10 MED ORDER — DOXYCYCLINE HYCLATE 100 MG PO TABS: 100.0000 mg | ORAL_TABLET | Freq: Two times a day (BID) | ORAL | Status: DC

## 2014-09-10 NOTE — Telephone Encounter (Signed)
States she has pulled 2 ticks off her and now shes not feeling good. Wants to be checked for lyme disease. Ok to order the test?

## 2014-09-10 NOTE — Telephone Encounter (Signed)
Yes pls order lyme and RMSF titers. Also I have sent in 1 week doxy based on history pls let he know to take the med

## 2014-09-10 NOTE — Addendum Note (Signed)
Addended by: Eual Fines on: 09/10/2014 01:15 PM   Modules accepted: Orders

## 2014-09-10 NOTE — Telephone Encounter (Signed)
Pt aware.

## 2014-09-11 DIAGNOSIS — T148 Other injury of unspecified body region: Secondary | ICD-10-CM | POA: Diagnosis not present

## 2014-09-11 DIAGNOSIS — W57XXXA Bitten or stung by nonvenomous insect and other nonvenomous arthropods, initial encounter: Secondary | ICD-10-CM | POA: Diagnosis not present

## 2014-09-21 ENCOUNTER — Other Ambulatory Visit: Payer: Self-pay | Admitting: Family Medicine

## 2014-10-22 ENCOUNTER — Telehealth: Payer: Self-pay | Admitting: *Deleted

## 2014-10-22 NOTE — Telephone Encounter (Signed)
Pat from [place] called lmom requesting a med refill for patient she did not leave a contact number

## 2014-10-22 NOTE — Telephone Encounter (Signed)
Called and left message asking what medication she needed

## 2014-10-22 NOTE — Telephone Encounter (Signed)
Pt didn't need any med refills at this time

## 2014-10-27 ENCOUNTER — Telehealth: Payer: Self-pay | Admitting: *Deleted

## 2014-10-27 NOTE — Telephone Encounter (Signed)
Patient called Renee Waters stating she has been feeling dizzy and she has been having trouble with medications. Please advise

## 2014-10-27 NOTE — Telephone Encounter (Signed)
Patient been feeling very dizzy. appt scheduled

## 2014-10-28 ENCOUNTER — Ambulatory Visit: Payer: Medicare Other | Admitting: Family Medicine

## 2014-10-29 ENCOUNTER — Ambulatory Visit (INDEPENDENT_AMBULATORY_CARE_PROVIDER_SITE_OTHER): Payer: Medicare Other | Admitting: Family Medicine

## 2014-10-29 ENCOUNTER — Encounter: Payer: Self-pay | Admitting: Family Medicine

## 2014-10-29 VITALS — BP 104/64 | HR 64 | Resp 18 | Ht 65.0 in | Wt 127.0 lb

## 2014-10-29 DIAGNOSIS — D509 Iron deficiency anemia, unspecified: Secondary | ICD-10-CM | POA: Diagnosis not present

## 2014-10-29 DIAGNOSIS — R7301 Impaired fasting glucose: Secondary | ICD-10-CM

## 2014-10-29 DIAGNOSIS — F5105 Insomnia due to other mental disorder: Secondary | ICD-10-CM

## 2014-10-29 DIAGNOSIS — E559 Vitamin D deficiency, unspecified: Secondary | ICD-10-CM

## 2014-10-29 DIAGNOSIS — F418 Other specified anxiety disorders: Secondary | ICD-10-CM

## 2014-10-29 DIAGNOSIS — G44029 Chronic cluster headache, not intractable: Secondary | ICD-10-CM

## 2014-10-29 DIAGNOSIS — I10 Essential (primary) hypertension: Secondary | ICD-10-CM

## 2014-10-29 DIAGNOSIS — M5441 Lumbago with sciatica, right side: Secondary | ICD-10-CM

## 2014-10-29 DIAGNOSIS — R42 Dizziness and giddiness: Secondary | ICD-10-CM

## 2014-10-29 DIAGNOSIS — F341 Dysthymic disorder: Secondary | ICD-10-CM

## 2014-10-29 NOTE — Patient Instructions (Addendum)
Keep Nov appt, call if you need me before  Nurse BP check with medications for review  You NEED to discuss dizziness and fatigue with Dr Lyla Son as s/e of ALL pain meds can potentially cause these  You only need immitrex  For migraines since you have these headaches once or twice per month  Care not to fall   Clonidine HALF TABLET ONCE EVERY MoRNING  Start tomorrow, BLOOD PRESSURE iS LOW NOW
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
## 2014-10-30 DIAGNOSIS — Z79891 Long term (current) use of opiate analgesic: Secondary | ICD-10-CM | POA: Diagnosis not present

## 2014-11-03 DIAGNOSIS — D509 Iron deficiency anemia, unspecified: Secondary | ICD-10-CM | POA: Diagnosis not present

## 2014-11-03 DIAGNOSIS — I10 Essential (primary) hypertension: Secondary | ICD-10-CM | POA: Diagnosis not present

## 2014-11-03 DIAGNOSIS — R7301 Impaired fasting glucose: Secondary | ICD-10-CM | POA: Diagnosis not present

## 2014-11-03 DIAGNOSIS — E559 Vitamin D deficiency, unspecified: Secondary | ICD-10-CM | POA: Diagnosis not present

## 2014-11-04 LAB — LIPID PANEL
CHOL/HDL RATIO: 3 ratio (ref <=5.0)
Cholesterol: 124 mg/dL — ABNORMAL LOW (ref 125–200)
HDL: 42 mg/dL — AB (ref >=46)
LDL CALC: 70 mg/dL (ref <130)
TRIGLYCERIDES: 62 mg/dL (ref <150)
VLDL: 12 mg/dL (ref <30)

## 2014-11-04 LAB — COMPLETE METABOLIC PANEL WITH GFR
ALT: 10 U/L (ref 6–29)
AST: 15 U/L (ref 10–35)
Albumin: 4.1 g/dL (ref 3.6–5.1)
Alkaline Phosphatase: 84 U/L (ref 33–130)
BILIRUBIN TOTAL: 0.7 mg/dL (ref 0.2–1.2)
BUN: 16 mg/dL (ref 7–25)
CO2: 24 mmol/L (ref 20–31)
Calcium: 9.2 mg/dL (ref 8.6–10.4)
Chloride: 109 mmol/L (ref 98–110)
Creat: 0.99 mg/dL (ref 0.50–0.99)
GFR, Est African American: 68 mL/min (ref >=60)
GFR, Est Non African American: 59 mL/min — ABNORMAL LOW (ref >=60)
GLUCOSE: 77 mg/dL (ref 65–99)
Potassium: 4 mmol/L (ref 3.5–5.3)
SODIUM: 143 mmol/L (ref 135–146)
TOTAL PROTEIN: 7.6 g/dL (ref 6.1–8.1)

## 2014-11-04 LAB — CBC
HCT: 41.6 % (ref 36.0–46.0)
HEMOGLOBIN: 13.7 g/dL (ref 12.0–15.0)
MCH: 31.1 pg (ref 26.0–34.0)
MCHC: 32.9 g/dL (ref 30.0–36.0)
MCV: 94.5 fL (ref 78.0–100.0)
MPV: 11.4 fL (ref 8.6–12.4)
Platelets: 176 10*3/uL (ref 150–400)
RBC: 4.4 MIL/uL (ref 3.87–5.11)
RDW: 14.3 % (ref 11.5–15.5)
WBC: 6.8 10*3/uL (ref 4.0–10.5)

## 2014-11-04 LAB — HEMOGLOBIN A1C
Hgb A1c MFr Bld: 6 % — ABNORMAL HIGH (ref <5.7)
Mean Plasma Glucose: 126 mg/dL — ABNORMAL HIGH (ref <117)

## 2014-11-04 LAB — TSH: TSH: 1.366 u[IU]/mL (ref 0.350–4.500)

## 2014-11-04 LAB — VITAMIN D 25 HYDROXY (VIT D DEFICIENCY, FRACTURES): Vit D, 25-Hydroxy: 64 ng/mL (ref 30–100)

## 2014-11-12 DIAGNOSIS — R42 Dizziness and giddiness: Secondary | ICD-10-CM | POA: Insufficient documentation

## 2014-11-12 NOTE — Assessment & Plan Note (Signed)
Over corrected and symptomatic, reduce med dose and recheck BP in 1 week DASH diet and commitment to daily physical activity for a minimum of 30 minutes discussed and encouraged, as a part of hypertension management. The importance of attaining a healthy weight is also discussed.  BP/Weight 10/29/2014 07/03/2014 03/20/2014 03/18/2014 03/14/2014 03/10/2014 03/0/1314  Systolic BP 388 875 797 282 060 156 153  Diastolic BP 64 84 66 57 68 74 62  Wt. (Lbs) 127 128.12 129 129 131.2 131.04 133  BMI 21.13 21.32 21.47 21.47 21.83 21.81 22.13

## 2014-11-12 NOTE — Assessment & Plan Note (Signed)
New onset with marked hypotension and on multiple potentially mentally suppressing medications for pain, reduce BP meds, pt has appt in 2 days with pain specialist ans will address this further

## 2014-11-12 NOTE — Assessment & Plan Note (Signed)
Updated lab needed.  

## 2014-11-12 NOTE — Assessment & Plan Note (Signed)
Improved as far as frequency is concerned, discontinue topamax, only immitrex for headache, avgs 2 per month reportedly

## 2014-11-12 NOTE — Assessment & Plan Note (Signed)
Would benefit greatly from therapy, still resists, not suicidal or homicidal

## 2014-11-12 NOTE — Assessment & Plan Note (Signed)
Sleep hygiene reviewed and written information offered also. Prescription sent for  medication needed.  

## 2014-11-12 NOTE — Assessment & Plan Note (Signed)
Managed by pain clinic, will need to discuss management with Doc as appears to be over medicated

## 2014-11-25 ENCOUNTER — Ambulatory Visit (INDEPENDENT_AMBULATORY_CARE_PROVIDER_SITE_OTHER): Payer: Medicare Other

## 2014-11-25 VITALS — BP 104/74 | Wt 126.0 lb

## 2014-11-25 DIAGNOSIS — Z23 Encounter for immunization: Secondary | ICD-10-CM

## 2014-11-25 NOTE — Progress Notes (Signed)
States the dizziness is better and her blood pressure is 104/74 today and improved. Continue same dose of med and call back if anything changes

## 2014-12-02 ENCOUNTER — Other Ambulatory Visit: Payer: Self-pay | Admitting: Family Medicine

## 2014-12-09 DIAGNOSIS — M1990 Unspecified osteoarthritis, unspecified site: Secondary | ICD-10-CM | POA: Diagnosis not present

## 2014-12-09 DIAGNOSIS — Z79891 Long term (current) use of opiate analgesic: Secondary | ICD-10-CM | POA: Diagnosis not present

## 2014-12-09 DIAGNOSIS — M5136 Other intervertebral disc degeneration, lumbar region: Secondary | ICD-10-CM | POA: Diagnosis not present

## 2014-12-09 DIAGNOSIS — M47816 Spondylosis without myelopathy or radiculopathy, lumbar region: Secondary | ICD-10-CM | POA: Diagnosis not present

## 2014-12-11 ENCOUNTER — Encounter: Payer: Self-pay | Admitting: Family Medicine

## 2014-12-11 ENCOUNTER — Ambulatory Visit (INDEPENDENT_AMBULATORY_CARE_PROVIDER_SITE_OTHER): Payer: Medicare Other | Admitting: Family Medicine

## 2014-12-11 VITALS — BP 140/82 | HR 64 | Resp 18 | Ht 65.0 in | Wt 126.0 lb

## 2014-12-11 DIAGNOSIS — R7301 Impaired fasting glucose: Secondary | ICD-10-CM

## 2014-12-11 DIAGNOSIS — B009 Herpesviral infection, unspecified: Secondary | ICD-10-CM

## 2014-12-11 DIAGNOSIS — Z0000 Encounter for general adult medical examination without abnormal findings: Secondary | ICD-10-CM | POA: Diagnosis not present

## 2014-12-11 DIAGNOSIS — F341 Dysthymic disorder: Secondary | ICD-10-CM

## 2014-12-11 DIAGNOSIS — F5105 Insomnia due to other mental disorder: Secondary | ICD-10-CM

## 2014-12-11 DIAGNOSIS — F418 Other specified anxiety disorders: Secondary | ICD-10-CM

## 2014-12-11 DIAGNOSIS — I10 Essential (primary) hypertension: Secondary | ICD-10-CM

## 2014-12-11 DIAGNOSIS — Z1159 Encounter for screening for other viral diseases: Secondary | ICD-10-CM

## 2014-12-11 MED ORDER — BUSPIRONE HCL 5 MG PO TABS: ORAL_TABLET | ORAL | Status: DC

## 2014-12-11 MED ORDER — CLONIDINE HCL 0.3 MG PO TABS: 0.3000 mg | ORAL_TABLET | Freq: Two times a day (BID) | ORAL | Status: DC

## 2014-12-11 NOTE — Progress Notes (Signed)
   Subjective:    Patient ID: Renee Waters, female    DOB: [birth year], 69 y.o.   MRN: [id number]  HPI  Patient is in for annual physical exam. Currently experiencing outbreak of genital herpes and is self medicating appropriately, started 3 days ago Recent labs, if available are reviewed. Immunization is reviewed , and  updated if needed.   Review of Systems    See HPI  Objective:   Physical Exam BP 140/82 mmHg  Pulse 64  Resp 18  Ht 5\' 5"  (1.651 m)  Wt 126 lb (57.153 kg)  BMI 20.97 kg/m2  SpO2 99% Pleasant well nourished female, alert and oriented x 3, in no cardio-pulmonary distress. Afebrile. HEENT No facial trauma or asymetry. Sinuses non tender.  Extra occullar muscles intact, pupils equally reactive to light. External ears normal, tympanic membranes clear. Oropharynx moist, no exudate, good dentition. Neck: supple, no adenopathy,JVD or thyromegaly.No bruits.  Chest: Clear to ascultation bilaterally.No crackles or wheezes. Non tender to palpation  Breast: No asymetry,no masses or lumps. No tenderness. No nipple discharge or inversion. No axillary or supraclavicular adenopathy  Cardiovascular system; Heart sounds normal,  S1 and  S2 ,no S3.  No murmur, or thrill. Apical beat not displaced Peripheral pulses normal.  Abdomen: Soft, non tender, no organomegaly or masses. No bruits. Bowel sounds normal. No guarding, tenderness or rebound.  Rectal:  Normal sphincter tone. No mass.No rectal masses.  Guaiac negative stool.  GU: External genitalia normal female genitalia , female distribution of hair. Ulcerative lesions at introitius Urethral meatus normal in size, no  Prolapse, no lesions visibly  Present. Bladder non tender. Vagina erythematous and moist , with ulcers, currently experiencing herpes outbreak. Adequate pelvic support no  cystocele or rectocele noted  Uterus normal size, no adnexal masses, no cervical motion or adnexal  tenderness.   Musculoskeletal exam: Decreased though adequate ROM of spine, hips , shoulders and knees. No deformity ,swelling or crepitus noted. No muscle wasting or atrophy.   Neurologic: Cranial nerves 2 to 12 intact. Power, tone ,sensation and reflexes normal throughout. No disturbance in gait. No tremor.  Skin: Intact, no ulceration, erythema , scaling or rash noted. Pigmentation normal throughout  Psych; Normal mood and affect. Judgement and concentration normal        Assessment & Plan:  Annual physical exam Annual exam as documented. Counseling done  re healthy lifestyle involving commitment to 150 minutes exercise per week, heart healthy diet, and attaining healthy weight.The importance of adequate sleep also discussed. Regular seat belt use and home safety, is also discussed. Changes in health habits are decided on by the patient with goals and time frames  set for achieving them. Immunization and cancer screening needs are specifically addressed at this visit.   Essential hypertension Uncontrolled hypertension, inc clonidine dose by half tab in pM Nurse BP check in 6 weeks DASH diet and commitment to daily physical activity for a minimum of 30 minutes discussed and encouraged, as a part of hypertension management. The importance of attaining a healthy weight is also discussed.  BP/Weight 12/11/2014 11/25/2014 10/29/2014 07/03/2014 03/20/2014 2/0/2542 7/0/6237  Systolic BP 628 315 176 160 737 106 269  Diastolic BP 82 74 64 84 66 57 68  Wt. (Lbs) 126 126 127 128.12 129 129 131.2  BMI 20.97 20.97 21.13 21.32 21.47 21.47 21.83        Western blot positive HSV2 Current outbreak , on appropriate anti viral therapy

## 2014-12-11 NOTE — Assessment & Plan Note (Addendum)
Uncontrolled hypertension, inc clonidine dose by half tab in pM Nurse BP check in 6 weeks DASH diet and commitment to daily physical activity for a minimum of 30 minutes discussed and encouraged, as a part of hypertension management. The importance of attaining a healthy weight is also discussed.  BP/Weight 12/11/2014 11/25/2014 10/29/2014 07/03/2014 03/20/2014 05/12/4582 09/09/5073  Systolic BP 732 256 720 919 802 217 981  Diastolic BP 82 74 64 84 66 57 68  Wt. (Lbs) 126 126 127 128.12 129 129 131.2  BMI 20.97 20.97 21.13 21.32 21.47 21.47 21.83

## 2014-12-11 NOTE — Patient Instructions (Addendum)
Nurse bP check in 6 weeks, call if you need me sooner  MD f/u in 3 month  BP is still too high , increase clonidine to  an additional half tablet in the evening, continue one in the morning, 12 hours apart.TOTAL is one and a half tablets daily   Chem 7 and EGFR non fast, hep C screeen and HBA1C   Lab tests already show that you have a diagnosisi of lupus , i recommend that you return to your rheumatologist for ongoing care, pls let us know if you decide on this  Thanks for choosing [facility], we consider it a privelige to serve you.

## 2014-12-11 NOTE — Assessment & Plan Note (Signed)

## 2014-12-14 NOTE — Assessment & Plan Note (Signed)
Current outbreak , on appropriate anti viral therapy

## 2014-12-15 DIAGNOSIS — I10 Essential (primary) hypertension: Secondary | ICD-10-CM | POA: Diagnosis not present

## 2014-12-15 DIAGNOSIS — Z1159 Encounter for screening for other viral diseases: Secondary | ICD-10-CM | POA: Diagnosis not present

## 2014-12-15 DIAGNOSIS — R7301 Impaired fasting glucose: Secondary | ICD-10-CM | POA: Diagnosis not present

## 2014-12-17 DIAGNOSIS — R7301 Impaired fasting glucose: Secondary | ICD-10-CM

## 2014-12-17 LAB — HEMOGLOBIN A1C: A1c: 6

## 2015-01-14 ENCOUNTER — Other Ambulatory Visit: Payer: Self-pay | Admitting: Family Medicine

## 2015-01-22 ENCOUNTER — Telehealth: Payer: Self-pay | Admitting: Family Medicine

## 2015-01-22 DIAGNOSIS — R4182 Altered mental status, unspecified: Secondary | ICD-10-CM | POA: Diagnosis not present

## 2015-01-22 DIAGNOSIS — R918 Other nonspecific abnormal finding of lung field: Secondary | ICD-10-CM | POA: Diagnosis not present

## 2015-01-22 DIAGNOSIS — Z91030 Bee allergy status: Secondary | ICD-10-CM | POA: Diagnosis not present

## 2015-01-22 DIAGNOSIS — Z79899 Other long term (current) drug therapy: Secondary | ICD-10-CM | POA: Diagnosis not present

## 2015-01-22 DIAGNOSIS — A6000 Herpesviral infection of urogenital system, unspecified: Secondary | ICD-10-CM | POA: Diagnosis present

## 2015-01-22 DIAGNOSIS — N179 Acute kidney failure, unspecified: Secondary | ICD-10-CM | POA: Diagnosis not present

## 2015-01-22 DIAGNOSIS — E875 Hyperkalemia: Secondary | ICD-10-CM | POA: Diagnosis not present

## 2015-01-22 DIAGNOSIS — K219 Gastro-esophageal reflux disease without esophagitis: Secondary | ICD-10-CM | POA: Diagnosis present

## 2015-01-22 DIAGNOSIS — Z888 Allergy status to other drugs, medicaments and biological substances status: Secondary | ICD-10-CM | POA: Diagnosis not present

## 2015-01-22 DIAGNOSIS — Z87891 Personal history of nicotine dependence: Secondary | ICD-10-CM | POA: Diagnosis not present

## 2015-01-22 DIAGNOSIS — T40695A Adverse effect of other narcotics, initial encounter: Secondary | ICD-10-CM | POA: Diagnosis present

## 2015-01-22 DIAGNOSIS — D649 Anemia, unspecified: Secondary | ICD-10-CM | POA: Diagnosis not present

## 2015-01-22 DIAGNOSIS — T887XXA Unspecified adverse effect of drug or medicament, initial encounter: Secondary | ICD-10-CM | POA: Diagnosis not present

## 2015-01-22 DIAGNOSIS — Z79891 Long term (current) use of opiate analgesic: Secondary | ICD-10-CM | POA: Diagnosis not present

## 2015-01-22 DIAGNOSIS — F419 Anxiety disorder, unspecified: Secondary | ICD-10-CM | POA: Diagnosis present

## 2015-01-22 DIAGNOSIS — Z886 Allergy status to analgesic agent status: Secondary | ICD-10-CM | POA: Diagnosis not present

## 2015-01-22 DIAGNOSIS — R7989 Other specified abnormal findings of blood chemistry: Secondary | ICD-10-CM | POA: Diagnosis not present

## 2015-01-22 DIAGNOSIS — M1990 Unspecified osteoarthritis, unspecified site: Secondary | ICD-10-CM | POA: Diagnosis present

## 2015-01-22 DIAGNOSIS — I10 Essential (primary) hypertension: Secondary | ICD-10-CM | POA: Diagnosis not present

## 2015-01-22 NOTE — Telephone Encounter (Signed)
Called and spoke with patient.  She is having a hard time finding words, confusion, tremors, loss of grip.  Advised patient to go to ED.  She states that she was having some of these problems at last visit but it is much worse today.  Family members present.  Voiced advice to them as well.

## 2015-01-22 NOTE — Telephone Encounter (Signed)
Needs ED eval,  I called and spoke directly with spouse, who was with his wife in the ED Stated that since yesterday grip  Was week, she fell asleep from 5 pm yesterday and when she woke up legs were wobbly and could hold nothisn I advised I would  Want neurology eval once out of hospital as she ahs had intermittent confusion  He sttaed he will call the office back once he knew what was going on with her

## 2015-01-22 NOTE — Telephone Encounter (Signed)
Is very confused today, she is asking to speak to Dr. Moshe Cipro please advise?

## 2015-01-23 DIAGNOSIS — K822 Perforation of gallbladder: Secondary | ICD-10-CM | POA: Diagnosis not present

## 2015-01-23 DIAGNOSIS — K651 Peritoneal abscess: Secondary | ICD-10-CM | POA: Diagnosis not present

## 2015-01-23 DIAGNOSIS — I10 Essential (primary) hypertension: Secondary | ICD-10-CM | POA: Diagnosis not present

## 2015-01-23 DIAGNOSIS — E8809 Other disorders of plasma-protein metabolism, not elsewhere classified: Secondary | ICD-10-CM | POA: Diagnosis not present

## 2015-01-23 DIAGNOSIS — F419 Anxiety disorder, unspecified: Secondary | ICD-10-CM | POA: Diagnosis not present

## 2015-01-23 DIAGNOSIS — G92 Toxic encephalopathy: Secondary | ICD-10-CM | POA: Diagnosis present

## 2015-01-23 DIAGNOSIS — R000 Tachycardia, unspecified: Secondary | ICD-10-CM | POA: Diagnosis not present

## 2015-01-23 DIAGNOSIS — G8929 Other chronic pain: Secondary | ICD-10-CM | POA: Diagnosis not present

## 2015-01-23 DIAGNOSIS — K828 Other specified diseases of gallbladder: Secondary | ICD-10-CM | POA: Diagnosis not present

## 2015-01-23 DIAGNOSIS — R0689 Other abnormalities of breathing: Secondary | ICD-10-CM | POA: Diagnosis not present

## 2015-01-23 DIAGNOSIS — A6000 Herpesviral infection of urogenital system, unspecified: Secondary | ICD-10-CM | POA: Diagnosis present

## 2015-01-23 DIAGNOSIS — K810 Acute cholecystitis: Secondary | ICD-10-CM | POA: Diagnosis not present

## 2015-01-23 DIAGNOSIS — K812 Acute cholecystitis with chronic cholecystitis: Secondary | ICD-10-CM | POA: Diagnosis present

## 2015-01-23 DIAGNOSIS — Z79899 Other long term (current) drug therapy: Secondary | ICD-10-CM | POA: Diagnosis not present

## 2015-01-23 DIAGNOSIS — D649 Anemia, unspecified: Secondary | ICD-10-CM | POA: Diagnosis not present

## 2015-01-23 DIAGNOSIS — N170 Acute kidney failure with tubular necrosis: Secondary | ICD-10-CM | POA: Diagnosis present

## 2015-01-23 DIAGNOSIS — R748 Abnormal levels of other serum enzymes: Secondary | ICD-10-CM | POA: Diagnosis present

## 2015-01-23 DIAGNOSIS — D509 Iron deficiency anemia, unspecified: Secondary | ICD-10-CM | POA: Diagnosis not present

## 2015-01-23 DIAGNOSIS — R918 Other nonspecific abnormal finding of lung field: Secondary | ICD-10-CM | POA: Diagnosis not present

## 2015-01-23 DIAGNOSIS — R4182 Altered mental status, unspecified: Secondary | ICD-10-CM | POA: Diagnosis not present

## 2015-01-23 DIAGNOSIS — G9340 Encephalopathy, unspecified: Secondary | ICD-10-CM | POA: Diagnosis not present

## 2015-01-23 DIAGNOSIS — F418 Other specified anxiety disorders: Secondary | ICD-10-CM | POA: Diagnosis not present

## 2015-01-23 DIAGNOSIS — F329 Major depressive disorder, single episode, unspecified: Secondary | ICD-10-CM | POA: Diagnosis present

## 2015-01-23 DIAGNOSIS — R935 Abnormal findings on diagnostic imaging of other abdominal regions, including retroperitoneum: Secondary | ICD-10-CM | POA: Diagnosis not present

## 2015-01-23 DIAGNOSIS — K565 Intestinal adhesions [bands] with obstruction (postprocedural) (postinfection): Secondary | ICD-10-CM | POA: Diagnosis not present

## 2015-01-23 DIAGNOSIS — D72829 Elevated white blood cell count, unspecified: Secondary | ICD-10-CM | POA: Diagnosis not present

## 2015-01-23 DIAGNOSIS — Z743 Need for continuous supervision: Secondary | ICD-10-CM | POA: Diagnosis not present

## 2015-01-23 DIAGNOSIS — R140 Abdominal distension (gaseous): Secondary | ICD-10-CM | POA: Diagnosis not present

## 2015-01-23 DIAGNOSIS — J189 Pneumonia, unspecified organism: Secondary | ICD-10-CM | POA: Diagnosis not present

## 2015-01-23 DIAGNOSIS — E870 Hyperosmolality and hypernatremia: Secondary | ICD-10-CM | POA: Diagnosis not present

## 2015-01-23 DIAGNOSIS — E860 Dehydration: Secondary | ICD-10-CM | POA: Diagnosis present

## 2015-01-23 DIAGNOSIS — N179 Acute kidney failure, unspecified: Secondary | ICD-10-CM | POA: Diagnosis not present

## 2015-01-23 DIAGNOSIS — K811 Chronic cholecystitis: Secondary | ICD-10-CM | POA: Diagnosis not present

## 2015-01-23 DIAGNOSIS — Z9981 Dependence on supplemental oxygen: Secondary | ICD-10-CM | POA: Diagnosis not present

## 2015-01-23 DIAGNOSIS — I351 Nonrheumatic aortic (valve) insufficiency: Secondary | ICD-10-CM | POA: Diagnosis not present

## 2015-01-23 DIAGNOSIS — T39395A Adverse effect of other nonsteroidal anti-inflammatory drugs [NSAID], initial encounter: Secondary | ICD-10-CM | POA: Diagnosis present

## 2015-01-23 DIAGNOSIS — T424X1A Poisoning by benzodiazepines, accidental (unintentional), initial encounter: Secondary | ICD-10-CM | POA: Diagnosis present

## 2015-01-23 DIAGNOSIS — K219 Gastro-esophageal reflux disease without esophagitis: Secondary | ICD-10-CM | POA: Diagnosis present

## 2015-01-23 DIAGNOSIS — R404 Transient alteration of awareness: Secondary | ICD-10-CM | POA: Diagnosis not present

## 2015-01-23 DIAGNOSIS — T402X1A Poisoning by other opioids, accidental (unintentional), initial encounter: Secondary | ICD-10-CM | POA: Diagnosis present

## 2015-01-23 DIAGNOSIS — R7989 Other specified abnormal findings of blood chemistry: Secondary | ICD-10-CM | POA: Diagnosis not present

## 2015-02-05 ENCOUNTER — Telehealth: Payer: Self-pay | Admitting: Family Medicine

## 2015-02-05 DIAGNOSIS — F329 Major depressive disorder, single episode, unspecified: Secondary | ICD-10-CM | POA: Diagnosis not present

## 2015-02-05 DIAGNOSIS — Z48815 Encounter for surgical aftercare following surgery on the digestive system: Secondary | ICD-10-CM | POA: Diagnosis not present

## 2015-02-05 DIAGNOSIS — M150 Primary generalized (osteo)arthritis: Secondary | ICD-10-CM | POA: Diagnosis not present

## 2015-02-05 DIAGNOSIS — F419 Anxiety disorder, unspecified: Secondary | ICD-10-CM | POA: Diagnosis not present

## 2015-02-05 DIAGNOSIS — I10 Essential (primary) hypertension: Secondary | ICD-10-CM | POA: Diagnosis not present

## 2015-02-05 NOTE — Telephone Encounter (Signed)
Pls call , let her/ her spouse  Know that I am thankful back home yesterday and improved. Let them know that  I am aware that she had surgery while recently hospitalized (gall bladder)  Pls sched f/u appt with me in 10 to 14 days also, thanks

## 2015-02-05 NOTE — Telephone Encounter (Signed)
Patient says thank you for keeping in touch, she is scheduled to be seen Fri Jan 6th at 1:30

## 2015-02-08 HISTORY — PX: LAPAROSCOPIC CHOLECYSTECTOMY: SUR755

## 2015-02-11 DIAGNOSIS — M150 Primary generalized (osteo)arthritis: Secondary | ICD-10-CM | POA: Diagnosis not present

## 2015-02-11 DIAGNOSIS — I10 Essential (primary) hypertension: Secondary | ICD-10-CM | POA: Diagnosis not present

## 2015-02-11 DIAGNOSIS — F419 Anxiety disorder, unspecified: Secondary | ICD-10-CM | POA: Diagnosis not present

## 2015-02-11 DIAGNOSIS — Z48815 Encounter for surgical aftercare following surgery on the digestive system: Secondary | ICD-10-CM | POA: Diagnosis not present

## 2015-02-11 DIAGNOSIS — F329 Major depressive disorder, single episode, unspecified: Secondary | ICD-10-CM | POA: Diagnosis not present

## 2015-02-12 ENCOUNTER — Other Ambulatory Visit: Payer: Self-pay | Admitting: Family Medicine

## 2015-02-12 ENCOUNTER — Telehealth: Payer: Self-pay | Admitting: Family Medicine

## 2015-02-12 MED ORDER — DIPHENOXYLATE-ATROPINE 2.5-0.025 MG PO TABS: ORAL_TABLET | ORAL | Status: DC

## 2015-02-12 NOTE — Telephone Encounter (Signed)
LOMOTIL SCRIPT PRIBTED, PLS CALL AND LET HER KNOW AND SEND

## 2015-02-12 NOTE — Telephone Encounter (Signed)
Pt aware and rx sent 

## 2015-02-12 NOTE — Telephone Encounter (Signed)
Since she has came home from the hospital she has had uncontrollable diarrhea, she does see Dr. Moshe Cipro tomorrow but is asking what to do now for that

## 2015-02-12 NOTE — Telephone Encounter (Signed)
Patient has watery diarrhea that started this am. Episodes of a couple times per hour or two. Has appt in the am but was wanting something called in for the diarrhea. No c/o dizziness or weakness

## 2015-02-13 ENCOUNTER — Ambulatory Visit: Payer: Medicare Other | Admitting: Family Medicine

## 2015-02-13 DIAGNOSIS — M150 Primary generalized (osteo)arthritis: Secondary | ICD-10-CM | POA: Diagnosis not present

## 2015-02-13 DIAGNOSIS — F419 Anxiety disorder, unspecified: Secondary | ICD-10-CM | POA: Diagnosis not present

## 2015-02-13 DIAGNOSIS — Z48815 Encounter for surgical aftercare following surgery on the digestive system: Secondary | ICD-10-CM | POA: Diagnosis not present

## 2015-02-13 DIAGNOSIS — I10 Essential (primary) hypertension: Secondary | ICD-10-CM | POA: Diagnosis not present

## 2015-02-13 DIAGNOSIS — F329 Major depressive disorder, single episode, unspecified: Secondary | ICD-10-CM | POA: Diagnosis not present

## 2015-02-17 DIAGNOSIS — K810 Acute cholecystitis: Secondary | ICD-10-CM | POA: Diagnosis not present

## 2015-02-20 ENCOUNTER — Telehealth: Payer: Self-pay | Admitting: Family Medicine

## 2015-02-20 DIAGNOSIS — Z48815 Encounter for surgical aftercare following surgery on the digestive system: Secondary | ICD-10-CM | POA: Diagnosis not present

## 2015-02-20 DIAGNOSIS — F419 Anxiety disorder, unspecified: Secondary | ICD-10-CM | POA: Diagnosis not present

## 2015-02-20 DIAGNOSIS — M150 Primary generalized (osteo)arthritis: Secondary | ICD-10-CM | POA: Diagnosis not present

## 2015-02-20 DIAGNOSIS — F329 Major depressive disorder, single episode, unspecified: Secondary | ICD-10-CM | POA: Diagnosis not present

## 2015-02-20 DIAGNOSIS — I10 Essential (primary) hypertension: Secondary | ICD-10-CM | POA: Diagnosis not present

## 2015-02-20 NOTE — Telephone Encounter (Signed)
Called and left message for patient to return call.  

## 2015-02-20 NOTE — Telephone Encounter (Signed)
Noted that patient did not keep hospital followup.  Please advise.

## 2015-02-20 NOTE — Telephone Encounter (Signed)
Tracey w/[facility] has left a message on the voicemail that she is at [place] today and Mrs. Angels Blood Pressure has been very low for the last few days, today lft arm was 96/52 and rt arm 100/52 , please advise?

## 2015-02-20 NOTE — Telephone Encounter (Signed)
Patient aware of advice and will come in on Monday 1/16 for visit.

## 2015-02-20 NOTE — Telephone Encounter (Signed)
plds reach out to pt, let her know of the concern re call from home health, that I missed her not keeping hosp f/u visit. Please encourage her to and sched a f/u one day next week, it is VITAL she bring the medication also, pls explain, give an appointment if she agrees, early in the week Let her know I have been advised BP is low, without assessing her and knowing what meds she is taking unable to give  Safe  Advice. If she feels weak or light headed needs to be evaluated  In ED

## 2015-02-23 ENCOUNTER — Ambulatory Visit: Payer: Medicare Other | Admitting: Family Medicine

## 2015-02-23 ENCOUNTER — Encounter: Payer: Self-pay | Admitting: Family Medicine

## 2015-02-25 ENCOUNTER — Encounter: Payer: Self-pay | Admitting: Family Medicine

## 2015-02-25 ENCOUNTER — Ambulatory Visit (INDEPENDENT_AMBULATORY_CARE_PROVIDER_SITE_OTHER): Payer: Medicare Other | Admitting: Family Medicine

## 2015-02-25 VITALS — BP 160/96 | HR 100 | Resp 18 | Ht 65.0 in | Wt 117.0 lb

## 2015-02-25 DIAGNOSIS — Z1159 Encounter for screening for other viral diseases: Secondary | ICD-10-CM | POA: Diagnosis not present

## 2015-02-25 DIAGNOSIS — D539 Nutritional anemia, unspecified: Secondary | ICD-10-CM | POA: Diagnosis not present

## 2015-02-25 DIAGNOSIS — D509 Iron deficiency anemia, unspecified: Secondary | ICD-10-CM

## 2015-02-25 DIAGNOSIS — I10 Essential (primary) hypertension: Secondary | ICD-10-CM | POA: Diagnosis not present

## 2015-02-25 DIAGNOSIS — Z48815 Encounter for surgical aftercare following surgery on the digestive system: Secondary | ICD-10-CM | POA: Diagnosis not present

## 2015-02-25 DIAGNOSIS — F329 Major depressive disorder, single episode, unspecified: Secondary | ICD-10-CM | POA: Diagnosis not present

## 2015-02-25 DIAGNOSIS — M150 Primary generalized (osteo)arthritis: Secondary | ICD-10-CM | POA: Diagnosis not present

## 2015-02-25 DIAGNOSIS — F419 Anxiety disorder, unspecified: Secondary | ICD-10-CM | POA: Diagnosis not present

## 2015-02-25 DIAGNOSIS — Z09 Encounter for follow-up examination after completed treatment for conditions other than malignant neoplasm: Secondary | ICD-10-CM | POA: Diagnosis not present

## 2015-02-25 DIAGNOSIS — R2681 Unsteadiness on feet: Secondary | ICD-10-CM

## 2015-02-25 DIAGNOSIS — R1011 Right upper quadrant pain: Secondary | ICD-10-CM

## 2015-02-25 DIAGNOSIS — F418 Other specified anxiety disorders: Secondary | ICD-10-CM

## 2015-02-25 DIAGNOSIS — E559 Vitamin D deficiency, unspecified: Secondary | ICD-10-CM | POA: Diagnosis not present

## 2015-02-25 MED ORDER — VALSARTAN 80 MG PO TABS: 80.0000 mg | ORAL_TABLET | Freq: Every day | ORAL | Status: DC

## 2015-02-25 NOTE — Patient Instructions (Addendum)
F/U in 5 weeks, call if you need me sooner  NEW or blood pressure is diovamn 80 mg one daily, get rid of cloniidine since you have not been taking it as it makes you dizzy and your blood pressure is high  You are referred to a psychologist for help with anxiety and stress, especially since your recent hospitalizationm, pls make and keep appointment when called  You are referred to physical therapy at home twice weekly for 6 weeks, to help with strengthening and balance  I will contact your surgeon and attempt an asap appointment since you have a 4 day h/o increased  Abdominal pain, if unable to see hi within next 1 week, will order an ultrasound of the area where you hurt

## 2015-02-25 NOTE — Assessment & Plan Note (Addendum)
Uncontrolled, start diovan, has not been taking clonidine due to s/e

## 2015-02-25 NOTE — Progress Notes (Signed)
Subjective:    Patient ID: Renee Waters, female    DOB: [birth year], 70 y.o.   MRN: [id number]  HPI Pt in for hospital follow up, hospitalized for approx 3 weeks with sepsis, AKI and gangrenous gallbladder. Still very weak, poor appetite, increased mood instability , anxiety and crying spells , not suicidal or homicidal. Saw surgeon last week, states no further f/u planned , but now also c/o 4 day h/o increased and uncontrolled RUQ pain in are of recent surgery. No drainage or fever, no nausea or vomit C/o unsteadiness and weakness and is very much interested in PT o help with strengthening Independently stopped clonidine, due to s/e is on no antihypertensive,a nd BP elevated at visit  Review of Systems See HPI Denies recent fever or chills. Denies sinus pressure, nasal congestion, ear pain or sore throat. Denies chest congestion, productive cough or wheezing. Denies chest pains, palpitations and leg swelling    Denies dysuria, frequency, hesitancy or incontinence. Chronic back pain unchanged. Denies headaches, seizures, numbness, or tingling.         Objective:   Physical Exam BP 160/96 mmHg  Pulse 100  Resp 18  Ht 5\' 5"  (1.651 m)  Wt 117 lb (53.071 kg)  BMI 19.47 kg/m2  SpO2 100%   Patient alert and in no cardiopulmonary distress.Anxious, at times tearful, and c/o abdominal pain   HEENT: No facial asymmetry, EOMI,   oropharynx pink and moist.  Neck supple no JVD, no mass.  Chest: Clear to auscultation bilaterally.  CVS: S1, S2 no murmurs, no S3.Regular rate.  ABD: Soft, non distended, RUQ tender, no guarding or rebound. Well healed surgical scar , no evidence of superinfection , no erythema or warmth.normal BS, rest of abdominal exam normal Area of "firmness" at inferior edge of surgical scar   Ext: No edema  MS: decreased , though adequate   ROM spine, shoulders, hips and knees.  Skin:surgical scar on abdomen healing well, no sign of infection, no erythema  or drainage, no ulcerations or rash noted.  Psych: Good eye contact, Anxious and tearful  affect. Memory impaired, anxious and  depressed appearing.At times broke out sobbing and shaking uncontrolably  CNS: CN 2-12 intact,  Decreased power,  Grade 4 in all 4 extremities and decreased tone in all extremities, sensation intact        Assessment & Plan:  Essential hypertension Uncontrolled, start diovan, has not been taking clonidine due to s/e  Hospital discharge follow-up Pt gradually improving following an approx 3 week hospitalizatioin at [facility], transferred from [place], with sepsis due to gangrenous gall bladder, and acute renal failure. Slowly improving but has 4 day h/o increased and uncontrolled RUQ pain, in area of recent surgery, will refer back to surgery on urgent basis. No imaging study ordered. Wound site is healing well. No warmth, tenderness or drainage, no distension noted but based on symptom I feel need to have surgery re eval pt  RUQ abdominal pain 4 day h/o increased RUQ pain , s/p cholecystectomy for gangrenous gall bladder. Physical exam , skin is not warm or red, the wound is healing well with no drainage. She is tender over the area , no rebound. Rest of abdominal exam is normal. Will refer for gen surg eval. No h/o vomit or change in BM, no imaging studies ordered   Unsteady gait Report of unsteady gait due to generalized weakness from prolonged hospitalization, also has long standing h/o spine and hip disease. Refer for in home pT twice  weekly for 6 weeks   Depression with anxiety Increased mood instability, crying and shaking spells as she recalls her illness, a lot of which she reports as "having forgotten". Concern voiced by her daughter present , and one who joined the visit on telephone , that she get help through counseling. Currently denies active suicidal thoughts but states that she can clearly recall asking "to be taken to join her deceased  Mother due to the severity of her illness" I have repeatedly advised her of the need to see a therapist ever since she has been my patient, approx 12 years, she has been  Resistant, but at today's visit , she has wisely decided to follow through with therapy, referral entered  IDA (iron deficiency anemia) Lab obtained after visit, she is to supplement with twice daily OTC iron

## 2015-03-02 ENCOUNTER — Telehealth: Payer: Self-pay | Admitting: Family Medicine

## 2015-03-02 DIAGNOSIS — F329 Major depressive disorder, single episode, unspecified: Secondary | ICD-10-CM | POA: Diagnosis not present

## 2015-03-02 DIAGNOSIS — Z09 Encounter for follow-up examination after completed treatment for conditions other than malignant neoplasm: Secondary | ICD-10-CM | POA: Insufficient documentation

## 2015-03-02 DIAGNOSIS — Z48815 Encounter for surgical aftercare following surgery on the digestive system: Secondary | ICD-10-CM | POA: Diagnosis not present

## 2015-03-02 DIAGNOSIS — M150 Primary generalized (osteo)arthritis: Secondary | ICD-10-CM | POA: Diagnosis not present

## 2015-03-02 DIAGNOSIS — D509 Iron deficiency anemia, unspecified: Secondary | ICD-10-CM | POA: Insufficient documentation

## 2015-03-02 DIAGNOSIS — I10 Essential (primary) hypertension: Secondary | ICD-10-CM | POA: Diagnosis not present

## 2015-03-02 DIAGNOSIS — F419 Anxiety disorder, unspecified: Secondary | ICD-10-CM | POA: Diagnosis not present

## 2015-03-02 NOTE — Assessment & Plan Note (Signed)
4 day h/o increased RUQ pain , s/p cholecystectomy for gangrenous gall bladder. Physical exam , skin is not warm or red, the wound is healing well with no drainage. She is tender over the area , no rebound. Rest of abdominal exam is normal. Will refer for gen surg eval. No h/o vomit or change in BM, no imaging studies ordered

## 2015-03-02 NOTE — Assessment & Plan Note (Signed)
Lab obtained after visit, she is to supplement with twice daily OTC iron

## 2015-03-02 NOTE — Assessment & Plan Note (Signed)
Report of unsteady gait due to generalized weakness from prolonged hospitalization, also has long standing h/o spine and hip disease. Refer for in home pT twice weekly for 6 weeks

## 2015-03-02 NOTE — Telephone Encounter (Signed)
Renee Waters, Patients husband is calling requesting lab results on Renee Waters, Please advise?

## 2015-03-02 NOTE — Telephone Encounter (Signed)
Patient aware of iron and ferritin results.

## 2015-03-02 NOTE — Assessment & Plan Note (Signed)
Pt gradually improving following an approx 3 week hospitalizatioin at [facility], transferred from [place], with sepsis due to gangrenous gall bladder, and acute renal failure. Slowly improving but has 4 day h/o increased and uncontrolled RUQ pain, in area of recent surgery, will refer back to surgery on urgent basis. No imaging study ordered. Wound site is healing well. No warmth, tenderness or drainage, no distension noted but based on symptom I feel need to have surgery re eval pt

## 2015-03-02 NOTE — Assessment & Plan Note (Signed)
Increased mood instability, crying and shaking spells as she recalls her illness, a lot of which she reports as "having forgotten". Concern voiced by her daughter present , and one who joined the visit on telephone , that she get help through counseling. Currently denies active suicidal thoughts but states that she can clearly recall asking "to be taken to join her deceased Mother due to the severity of her illness" I have repeatedly advised her of the need to see a therapist ever since she has been my patient, approx 12 years, she has been  Resistant, but at today's visit , she has wisely decided to follow through with therapy, referral entered

## 2015-03-03 DIAGNOSIS — G589 Mononeuropathy, unspecified: Secondary | ICD-10-CM | POA: Diagnosis not present

## 2015-03-03 DIAGNOSIS — Z9049 Acquired absence of other specified parts of digestive tract: Secondary | ICD-10-CM | POA: Diagnosis not present

## 2015-03-09 DIAGNOSIS — F329 Major depressive disorder, single episode, unspecified: Secondary | ICD-10-CM | POA: Diagnosis not present

## 2015-03-09 DIAGNOSIS — I10 Essential (primary) hypertension: Secondary | ICD-10-CM | POA: Diagnosis not present

## 2015-03-09 DIAGNOSIS — M150 Primary generalized (osteo)arthritis: Secondary | ICD-10-CM | POA: Diagnosis not present

## 2015-03-09 DIAGNOSIS — F419 Anxiety disorder, unspecified: Secondary | ICD-10-CM | POA: Diagnosis not present

## 2015-03-09 DIAGNOSIS — Z48815 Encounter for surgical aftercare following surgery on the digestive system: Secondary | ICD-10-CM | POA: Diagnosis not present

## 2015-03-10 ENCOUNTER — Encounter: Payer: Self-pay | Admitting: Family Medicine

## 2015-03-16 DIAGNOSIS — F419 Anxiety disorder, unspecified: Secondary | ICD-10-CM | POA: Diagnosis not present

## 2015-03-16 DIAGNOSIS — F329 Major depressive disorder, single episode, unspecified: Secondary | ICD-10-CM | POA: Diagnosis not present

## 2015-03-16 DIAGNOSIS — I10 Essential (primary) hypertension: Secondary | ICD-10-CM | POA: Diagnosis not present

## 2015-03-16 DIAGNOSIS — Z48815 Encounter for surgical aftercare following surgery on the digestive system: Secondary | ICD-10-CM | POA: Diagnosis not present

## 2015-03-16 DIAGNOSIS — M150 Primary generalized (osteo)arthritis: Secondary | ICD-10-CM | POA: Diagnosis not present

## 2015-03-17 ENCOUNTER — Telehealth: Payer: Self-pay | Admitting: Family Medicine

## 2015-03-17 NOTE — Telephone Encounter (Signed)
I  Recommend dulcolax tablet or suppository NEEDs to go every 4 days, don't wait for 13 days!!!  START DAILY colace 2 every day, mirilax (OTC) o 17 gm im 8 ounces water, and establish a patter using a laxative on day 4 IF NO BM

## 2015-03-17 NOTE — Telephone Encounter (Signed)
Had a small BM this am. Will try the recommended advice and call back with any problems

## 2015-03-17 NOTE — Telephone Encounter (Signed)
See note please °

## 2015-03-17 NOTE — Telephone Encounter (Signed)
Beth from [facility] has left a message on the machine stating that Renee Waters is complaining of not having a bowel movement since Fri Jan 27 inspite of plenty of liquids and prune juice. She is asking if Dr. Moshe Cipro could recommend something to help Renee Waters with this matter, please advise?

## 2015-03-18 ENCOUNTER — Telehealth: Payer: Self-pay | Admitting: Family Medicine

## 2015-03-18 NOTE — Telephone Encounter (Signed)
Referred to [facility] for PT

## 2015-03-18 NOTE — Telephone Encounter (Signed)
Brandi please call Lexine Baton with [facility] in regards to [facility]

## 2015-03-30 ENCOUNTER — Ambulatory Visit: Payer: Medicare Other | Admitting: Family Medicine

## 2015-04-08 ENCOUNTER — Other Ambulatory Visit: Payer: Self-pay | Admitting: Family Medicine

## 2015-04-15 ENCOUNTER — Ambulatory Visit (INDEPENDENT_AMBULATORY_CARE_PROVIDER_SITE_OTHER): Payer: Medicare Other | Admitting: Family Medicine

## 2015-04-15 ENCOUNTER — Encounter: Payer: Self-pay | Admitting: Family Medicine

## 2015-04-15 VITALS — BP 120/80 | HR 88 | Resp 16 | Ht 65.0 in | Wt 117.0 lb

## 2015-04-15 DIAGNOSIS — M5441 Lumbago with sciatica, right side: Secondary | ICD-10-CM

## 2015-04-15 DIAGNOSIS — F341 Dysthymic disorder: Secondary | ICD-10-CM

## 2015-04-15 DIAGNOSIS — D509 Iron deficiency anemia, unspecified: Secondary | ICD-10-CM

## 2015-04-15 DIAGNOSIS — R7302 Impaired glucose tolerance (oral): Secondary | ICD-10-CM | POA: Diagnosis not present

## 2015-04-15 DIAGNOSIS — E559 Vitamin D deficiency, unspecified: Secondary | ICD-10-CM

## 2015-04-15 DIAGNOSIS — I10 Essential (primary) hypertension: Secondary | ICD-10-CM

## 2015-04-15 DIAGNOSIS — K219 Gastro-esophageal reflux disease without esophagitis: Secondary | ICD-10-CM

## 2015-04-15 DIAGNOSIS — R630 Anorexia: Secondary | ICD-10-CM

## 2015-04-15 DIAGNOSIS — F418 Other specified anxiety disorders: Secondary | ICD-10-CM

## 2015-04-15 DIAGNOSIS — F5105 Insomnia due to other mental disorder: Secondary | ICD-10-CM

## 2015-04-15 MED ORDER — METHYLPREDNISOLONE ACETATE 80 MG/ML IJ SUSP
80.0000 mg | Freq: Once | INTRAMUSCULAR | Status: AC
  Administered 2015-04-15: 80 mg via INTRAMUSCULAR

## 2015-04-15 MED ORDER — KETOROLAC TROMETHAMINE 60 MG/2ML IM SOLN
60.0000 mg | Freq: Once | INTRAMUSCULAR | Status: AC
  Administered 2015-04-15: 60 mg via INTRAMUSCULAR

## 2015-04-15 MED ORDER — PREDNISONE 5 MG (21) PO TBPK: ORAL_TABLET | ORAL | Status: DC

## 2015-04-15 MED ORDER — MEGESTROL ACETATE 40 MG PO TABS: 40.0000 mg | ORAL_TABLET | Freq: Every day | ORAL | Status: DC

## 2015-04-15 NOTE — Progress Notes (Signed)
   Subjective:    Patient ID: Renee Waters, female    DOB: 1946-01-12, 70 y.o.   MRN: [id number]  HPI   Renee Waters     MRN: [id number]      DOB: [birth year]   HPI Renee Waters is here for follow up and re-evaluation of chronic medical conditions, medication management and review of any available recent lab and radiology data.  Preventive health is updated, specifically  Cancer screening and Immunization.   Questions or concerns regarding consultations or procedures which the PT has had in the interim are  Addressed.Has again been discharged from surgical clinic and is fine with this.Did not make appt with therapist and is choosing to hold off once again The PT denies any adverse reactions to current medications since the last visit.  C/o increased spine pain, esp with weight loss and lack of appetite, wants help for both  ROS Denies recent fever or chills. Denies sinus pressure, nasal congestion, ear pain or sore throat. Denies chest congestion, productive cough or wheezing. Denies chest pains, palpitations and leg swelling Denies abdominal pain, nausea, vomiting,diarrhea or constipation.   Denies dysuria, frequency, hesitancy or incontinence. Denies headaches, seizures, numbness, or tingling. Denies uncontrolled depression, anxiety or insomnia. Denies skin break down or rash.   PE  BP 120/80 mmHg  Pulse 88  Resp 16  Ht 5\' 5"  (1.651 m)  Wt 117 lb (53.071 kg)  BMI 19.47 kg/m2  SpO2 100%  Patient alert and oriented and in no cardiopulmonary distress.  HEENT: No facial asymmetry, EOMI,   oropharynx pink and moist.  Neck decreased ROM no JVD, no mass.  Chest: Clear to auscultation bilaterally.  CVS: S1, S2 no murmurs, no S3.Regular rate.  ABD: Soft non tender.   Ext: No edema  MS: decreased  ROM spine, shoulders, hips and knees.  Skin: Intact, no ulcerations or rash noted.  Psych: Good eye contact, normal affect. Memory intact not anxious mildly  depressed  And  tearful at times.  CNS: CN 2-12 intact, power,  normal throughout.no focal deficits noted.   Assessment & Plan   Essential hypertension Controlled, no change in medication   Insomnia secondary to depression with anxiety Sleep hygiene reviewed and written information offered also. Prescription sent for  medication needed.   Poor appetite Start daily megace, and encouraged to use supplements if needed to boost intake, f/u in 2 months to assess  response  Backache Uncontrolled.Toradol and depo medrol administered IM in the office , to be followed by a short course of oral prednisone    GERD (gastroesophageal reflux disease) Controlled, no change in medication   Depression with anxiety Improved, contierue current med, pt did not see therapist and is choosing to hold off, currently under increased stress due to loss of a close family memebr      Review of Systems     Objective:   Physical Exam        Assessment & Plan:

## 2015-04-15 NOTE — Patient Instructions (Addendum)
F/u in 2.5  month, call if you need me sooner  Injections today and 5 day course of prednisone sent for back pain  New for appetite is megace one daily   Fasting lipid, cmp , hBA1C, CBC , vit D 2nd week in April  Blood pressure is excellent  Thankful you are doing much better  Condolence re recent loss

## 2015-04-17 ENCOUNTER — Other Ambulatory Visit: Payer: Self-pay

## 2015-04-17 MED ORDER — MEGESTROL ACETATE 40 MG PO TABS: 40.0000 mg | ORAL_TABLET | Freq: Every day | ORAL | Status: DC

## 2015-04-25 DIAGNOSIS — R630 Anorexia: Secondary | ICD-10-CM | POA: Insufficient documentation

## 2015-04-25 NOTE — Assessment & Plan Note (Signed)
Controlled, no change in medication  

## 2015-04-25 NOTE — Assessment & Plan Note (Signed)
Sleep hygiene reviewed and written information offered also. Prescription sent for  medication needed.  

## 2015-04-25 NOTE — Assessment & Plan Note (Signed)
Uncontrolled.Toradol and depo medrol administered IM in the office , to be followed by a short course of oral prednisone   

## 2015-04-25 NOTE — Assessment & Plan Note (Addendum)
Start daily megace, and encouraged to use supplements if needed to boost intake, f/u in 2 months to assess  response

## 2015-04-25 NOTE — Assessment & Plan Note (Signed)
Improved, contierue current med, pt did not see therapist and is choosing to hold off, currently under increased stress due to loss of a close family memebr

## 2015-05-05 ENCOUNTER — Other Ambulatory Visit: Payer: Self-pay | Admitting: Family Medicine

## 2015-05-13 ENCOUNTER — Telehealth: Payer: Self-pay | Admitting: Family Medicine

## 2015-05-13 NOTE — Telephone Encounter (Signed)
Patient is asking for a returned call from the nurse, she is still having a lot of abdominal issues,please advise?

## 2015-05-13 NOTE — Telephone Encounter (Signed)
Patient states that she is having abdominal pain around surgical site.  States that it worsens with eating.  Duration x 3 weeks.  Is asking if a scan can be ordered.

## 2015-05-13 NOTE — Telephone Encounter (Signed)
Called and left message for patient to return call.  

## 2015-05-13 NOTE — Telephone Encounter (Signed)
I think it better if she gets test one from her surgeon's facility and office, if she agreees pls enter urgent refwerral, I believe that theoffice in [place] will see her asap, and I need to know the decision pls

## 2015-05-14 ENCOUNTER — Other Ambulatory Visit: Payer: Self-pay | Admitting: Family Medicine

## 2015-05-14 ENCOUNTER — Other Ambulatory Visit: Payer: Self-pay

## 2015-05-14 DIAGNOSIS — R1013 Epigastric pain: Secondary | ICD-10-CM

## 2015-05-14 DIAGNOSIS — R634 Abnormal weight loss: Secondary | ICD-10-CM

## 2015-05-14 MED ORDER — RANITIDINE HCL 150 MG PO CAPS: 150.0000 mg | ORAL_CAPSULE | Freq: Two times a day (BID) | ORAL | Status: DC

## 2015-05-14 NOTE — Telephone Encounter (Signed)
Patient is adamit about not going back to [place].  She does not have reliable transportation.  Do you want me to contact the surgeon's office and see what imaging they recommend due to symptoms?

## 2015-05-14 NOTE — Addendum Note (Signed)
Addended by: Tula Nakayama E on: 05/14/2015 12:58 PM   Modules accepted: Orders

## 2015-05-14 NOTE — Telephone Encounter (Signed)
Patient aware.

## 2015-05-14 NOTE — Telephone Encounter (Signed)
Medication sent to [facility] in [place]

## 2015-05-14 NOTE — Telephone Encounter (Signed)
I spoke directly with the pt she states that she experiences pain WHEN she eats, has also continued to lose weight States that she DOES have an appetite and her bowel movements have NOT changed  Will add ranitidine and refer asap to Dr Sydell Axon

## 2015-05-14 NOTE — Telephone Encounter (Signed)
msg for her to let me know which pharmacy to send her med to Ranitidine is entered If mail order pls chage to 3 month supply

## 2015-05-18 ENCOUNTER — Encounter: Payer: Self-pay | Admitting: Internal Medicine

## 2015-05-22 DIAGNOSIS — R7302 Impaired glucose tolerance (oral): Secondary | ICD-10-CM | POA: Diagnosis not present

## 2015-05-22 DIAGNOSIS — E559 Vitamin D deficiency, unspecified: Secondary | ICD-10-CM | POA: Diagnosis not present

## 2015-05-22 DIAGNOSIS — D539 Nutritional anemia, unspecified: Secondary | ICD-10-CM | POA: Diagnosis not present

## 2015-05-22 DIAGNOSIS — D509 Iron deficiency anemia, unspecified: Secondary | ICD-10-CM | POA: Diagnosis not present

## 2015-05-22 DIAGNOSIS — I10 Essential (primary) hypertension: Secondary | ICD-10-CM | POA: Diagnosis not present

## 2015-05-25 ENCOUNTER — Telehealth: Payer: Self-pay | Admitting: Family Medicine

## 2015-05-25 NOTE — Telephone Encounter (Signed)
Pls add iron and ferritin She is extremely anemic , has appt for weight loss at [facility] in near future , with Neil Crouch , pls fax this lab  Pls let pt know that her cholesterol, kidney, liver and vit D are excellent Blood sugar a "little elevated " at 5.8 , normal is 5.6   Explain to her that she is anemic, likely needs supplemental iron, will let her know when 2nd lab report available, HOWEVER, explain also that I am making her gI Doc aware and has upcoming appt

## 2015-05-26 NOTE — Telephone Encounter (Signed)
Pt aware.

## 2015-05-26 NOTE — Telephone Encounter (Signed)
Pt aware and results faxed

## 2015-05-27 ENCOUNTER — Encounter: Payer: Self-pay | Admitting: Gastroenterology

## 2015-05-27 ENCOUNTER — Other Ambulatory Visit: Payer: Self-pay | Admitting: Family Medicine

## 2015-05-27 ENCOUNTER — Ambulatory Visit (INDEPENDENT_AMBULATORY_CARE_PROVIDER_SITE_OTHER): Payer: Medicare Other | Admitting: Gastroenterology

## 2015-05-27 VITALS — BP 144/84 | HR 84 | Temp 99.2°F | Ht 65.0 in | Wt 117.0 lb

## 2015-05-27 DIAGNOSIS — R634 Abnormal weight loss: Secondary | ICD-10-CM | POA: Diagnosis not present

## 2015-05-27 DIAGNOSIS — R1013 Epigastric pain: Secondary | ICD-10-CM | POA: Diagnosis not present

## 2015-05-27 DIAGNOSIS — D649 Anemia, unspecified: Secondary | ICD-10-CM | POA: Diagnosis not present

## 2015-05-27 NOTE — Patient Instructions (Addendum)
We will obtain a copy for labs for review. Further recommendations to follow.  Continue to monitor for ongoing abdominal pain, weight loss. If persisting over the next 1-2 weeks, would recommend CT of the abdomen and pelvis.

## 2015-05-27 NOTE — Progress Notes (Signed)
Primary Care Physician:  Tula Nakayama, MD  Primary Gastroenterologist:  Garfield Cornea, MD   Chief Complaint  Patient presents with  . Abdominal Pain  . Weight Loss    HPI:  Renee Waters is a 70 y.o. female here for further evaluation of abdominal pain and weight loss.Last seen EGD for Barrett's esophagus surveillance back in February 2016. No dysplasia on biopsies. 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   Patient presents at the request of Dr. Moshe Cipro for further evaluation of anemia, weight loss, abdominal pain. Patient was hospitalized in December 2016 with sepsis, acute renal insufficiency, gangrenous gallbladder. She was in the hospital for 3 weeks. She required surgical intervention and management of gangrenous cholecystitis with gallbladder rupture and small bowel obstruction. Dropped down 20 pounds with illness. 110lb at discharge.  Has not fully recovered. Continues to have fatigue, feels weak. Postoperatively she continued to have right upper quadrant pain especially with meals. This prevented her from eating. This has gradually improved. Continues to have the discomfort in the right upper quadrant when she bends over or touches the area. Bowel movements are regular. Denies blood in the stool or melena. No heartburn, dysphagia.  Prior to her illness she had normal H/H 10/2014. At discharge on 02/04/15 her hemoglobin was 6.7, white blood cell count 36,600. Transaminitis, mild. More recently her H/H have improved, see below. She remains iron deficient.     Current Outpatient Prescriptions  Medication Sig Dispense Refill  . acyclovir (ZOVIRAX) 800 MG tablet Take 800 mg by mouth 4 (four) times daily.    Marland Kitchen ALPRAZolam (XANAX) 0.5 MG tablet TAKE 1 TABLET BY MOUTH ONCE DAILY 30 tablet 2  . citalopram (CELEXA) 40 MG tablet TAKE 1 TABLET BY MOUTH ONCE DAILY (Patient taking differently: Takes 1/2 tablet daily) 30 tablet 3  . diphenoxylate-atropine (LOMOTIL) 2.5-0.025 MG tablet oNE TABLET   UP TO 4  TIMES DAILY AS NEEDED FOR LOOSE STOOL 20 tablet 0  . gabapentin (NEURONTIN) 600 MG tablet Take 600 mg by mouth 2 (two) times daily.    . megestrol (MEGACE) 40 MG tablet Take 1 tablet (40 mg total) by mouth daily. 30 tablet 0  . Multiple Vitamins-Minerals (MULTIVITAMINS THER. W/MINERALS) TABS Take 1 tablet by mouth daily.     Marland Kitchen omeprazole (PRILOSEC) 40 MG capsule TAKE 1 CAPSULE BY MOUTH ONCE DAILY 30 capsule 2  . Oxycodone HCl 10 MG TABS Take 10 mg by mouth. 1 tab every 6 hours as needed    . temazepam (RESTORIL) 30 MG capsule TAKE 1 CAPSULE BY MOUTH ONCE DAILY EVERY NIGHT AT BEDTIME 30 capsule 1  . tiZANidine (ZANAFLEX) 4 MG capsule Take 4 mg by mouth daily.    . valsartan (DIOVAN) 80 MG tablet Take 1 tablet (80 mg total) by mouth daily. 30 tablet 3  . Vitamin D, Ergocalciferol, (DRISDOL) 50000 UNITS CAPS capsule TAKE 1 CAPSULE BY MOUTH ONCE A WEEK 4 capsule 2   No current facility-administered medications for this visit.    Allergies as of 05/27/2015 - Review Complete 05/27/2015  Allergen Reaction Noted  . Tylox [oxycodone-acetaminophen] Other (See Comments) 09/27/2010  . Bee venom Swelling 06/12/2012    Past Medical History  Diagnosis Date  . Insomnia   . Chronic back pain   . Chronic neck pain   . Depression   . GERD (gastroesophageal reflux disease)   . Osteoarthritis   . Genital herpes   . Hypertension   . S/P colonoscopy June 2005    normal, no polyps  .  S/P endoscopy June 2005, Oct 2009    2005: short-segment Barrett's, 2009: short-segment Barrett's  . Barrett's esophagus   . UTI (lower urinary tract infection) 10/14    currently on Penicillin-  states is improving  . History of blood transfusion   . Anemia     Past Surgical History  Procedure Laterality Date  . Cervical disectomy  2002  . Shoulder arthroscopy  2008    left  . Right hip replacement  07/2010    went back in sept 2012 to fix  . Hernia repair  07/2010    Dr. Zada Girt  . Abdominal  hysterectomy    . Esophagogastroduodenoscopy  11/29/2007    salmon-colored  tongue   longest stable at  3 cm, distal esophagus as described previously status post biopsy/ Hiatal hernia, otherwise normal stomach D1 and D2  . Esophagogastroduodenoscopy  01/06/11    short segment Barrett's esophagus s/p bx/Hiatal hernia  . Colonoscopy  11/09/2011    RMR: Melanosis coli  . Total hip revision Right 12/17/2012    Procedure: RIGHT TOTAL HIP REVISION;  Surgeon: Mauri Pole, MD;  Location: [facility];  Service: Orthopedics;  Laterality: Right;  . Carpal tunnel release Left 2013  . Wrist surgery Right 2011  . Esophagogastroduodenoscopy (egd) with propofol N/A 03/20/2014    [id number] distal esophagus short segment barrett's, bx with no dysplasia. next egd in 03/2017  . Biopsy N/A 03/20/2014    Procedure: BIOPSY;  Surgeon: Daneil Dolin, MD;  Location: [facility];  Service: Endoscopy;  Laterality: N/A;  . Cholecystectomy      with lysis of adhesions for sbo    Family History  Problem Relation Age of Onset  . Hypertension Mother   . Stroke Mother   . Colon cancer Neg Hx   . Anesthesia problems Neg Hx   . Hypotension Neg Hx   . Malignant hyperthermia Neg Hx   . Pseudochol deficiency Neg Hx     Social History   Social History  . Marital Status: Married    Spouse Name: N/A  . Number of Children: N/A  . Years of Education: N/A   Occupational History  . Not on file.   Social History Main Topics  . Smoking status: Former Smoker -- 0.25 packs/day for 25 years    Types: Cigarettes    Quit date: 02/07/2003  . Smokeless tobacco: Never Used     Comment: quit in 2004  . Alcohol Use: Yes     Comment: socially, rare  . Drug Use: No  . Sexual Activity: Not on file   Other Topics Concern  . Not on file   Social History Narrative      ROS:  General: Negative for anorexia, weight loss, fever, chills.+ fatigue, weakness. Eyes: Negative for vision changes.  ENT: Negative for hoarseness,  difficulty swallowing , nasal congestion. CV: Negative for chest pain, angina, palpitations, dyspnea on exertion, peripheral edema.  Respiratory: Negative for dyspnea at rest, dyspnea on exertion, cough, sputum, wheezing.  GI: See history of present illness. GU:  Negative for dysuria, hematuria, urinary incontinence, urinary frequency, nocturnal urination.  MS: Negative for joint pain, low back pain.  Derm: Negative for rash or itching.  Neuro: Negative for weakness, abnormal sensation, seizure, frequent headaches, memory loss, confusion.  Psych: Negative for anxiety, depression, suicidal ideation, hallucinations.  Endo: Negative for unusual weight change.  Heme: Negative for bruising or bleeding. Allergy: Negative for rash or hives.    Physical Examination:  BP  144/84 mmHg  Pulse 84  Temp(Src) 99.2 F (37.3 C) (Oral)  Ht 5\' 5"  (1.651 m)  Wt 117 lb (53.071 kg)  BMI 19.47 kg/m2   General: Well-nourished, well-developed in no acute distress.  Head: Normocephalic, atraumatic.   Eyes: Conjunctiva pink, no icterus. Mouth: Oropharyngeal mucosa moist and pink , no lesions erythema or exudate. Neck: Supple without thyromegaly, masses, or lymphadenopathy.  Lungs: Clear to auscultation bilaterally.  Heart: Regular rate and rhythm, no murmurs rubs or gallops.  Abdomen: Bowel sounds are normal, nontender, nondistended, no hepatosplenomegaly or masses, no abdominal bruits or    hernia , no rebound or guarding.  Well healed incisions Rectal: deferred Extremities: No lower extremity edema. No clubbing or deformities.  Neuro: Alert and oriented x 4 , grossly normal neurologically.  Skin: Warm and dry, no rash or jaundice.   Psych: Alert and cooperative, normal mood and affect.  Labs: Lab Results  Component Value Date   WBC 6.8 11/03/2014   HGB 13.7 11/03/2014   HCT 41.6 11/03/2014   MCV 94.5 11/03/2014   PLT 176 11/03/2014   Lab Results  Component Value Date   ALT 10 11/03/2014    AST 15 11/03/2014   ALKPHOS 84 11/03/2014   BILITOT 0.7 11/03/2014   [facility] labs dated 05/22/2015 BUN 12, creatinine 0.86, total bilirubin 0.4, alkaline phosphatase 57, AST 11, ALT 10, albumin 3.8, hemoglobin A1c 5.8, serum iron 12 low, ferritin 10 low, hemoglobin 8.3 low, hematocrit 27.8 low, MCV 72 low platelets 291,000 white blood cell count 6000.     Imaging Studies: No results found.

## 2015-06-02 ENCOUNTER — Other Ambulatory Visit: Payer: Self-pay | Admitting: Family Medicine

## 2015-06-02 ENCOUNTER — Encounter: Payer: Self-pay | Admitting: Gastroenterology

## 2015-06-02 NOTE — Progress Notes (Signed)
Pt is aware of recommendations, she said she is still loosing weight but she is not having very much pain and feels like she is getting better.

## 2015-06-02 NOTE — Progress Notes (Signed)
Please let patient know that I have reviewed her labs. Her hemoglobin is somewhat improved from where it was at time of discharge back in December. She does not need a blood transfusion at this time. She does need to be on iron supplement twice daily, such as ferrous sulfate 325 mg twice a day.  How is her appetite, abdominal pain, weight.

## 2015-06-02 NOTE — Progress Notes (Signed)
cc'ed to pcp °

## 2015-06-02 NOTE — Progress Notes (Signed)
Tried to call pt- NA- LMOM 

## 2015-06-02 NOTE — Assessment & Plan Note (Signed)
70 year old female with complicated course 4 months ago when she presented with sepsis, acute renal failure. Ultimately determined to have gangrenous cholecystitis with ruptured gallbladder and small bowel obstruction. Managed at [facility]. She has slowly been recovering. Continues to have some upper abdominal discomfort, fatigue/weakness, anemia, weight loss. Over the past 1-2 weeks she has had gradual improvement in her symptoms. Recent hemoglobin of 8.3, up from 6.7 at time of discharge 4 months ago. She has had no overt GI bleeding. Her iron studies are still low. EGD and colonoscopy are up-to-date. Suspect symptoms due to major acute illness with slow recovery.  Recommend iron twice daily. Monitor closely over the next 1-2 weeks. If ongoing weight loss or abdominal pain, consider CT abdomen pelvis with contrast. To discuss further management with Dr. Gala Romney as well.

## 2015-06-09 ENCOUNTER — Ambulatory Visit: Payer: Medicare Other | Admitting: Gastroenterology

## 2015-06-11 ENCOUNTER — Telehealth: Payer: Self-pay | Admitting: Internal Medicine

## 2015-06-11 NOTE — Telephone Encounter (Signed)
I spoke with the pt, she said she continues to have abd tenderness and certain foods make it worse. She is having several formed bm's daily. Yesterday she thought she needed to have a bm and went to the bathroom and the only thing that came out was bright red blood and mucous. This only happened one time. No fever, no vomiting. She went to her pcp yesterday and has lost another pound. She said she is not having any more fatigue or dizziness than she normally has. She is taking her iron bid and drinking supplements with extra protein.    She said she would like to go ahead and have the scan done.

## 2015-06-11 NOTE — Telephone Encounter (Signed)
Candy, will you schedule CT for pt.

## 2015-06-11 NOTE — Telephone Encounter (Signed)
[id number]  PATIENT WENT TO THE RESTROOM YESTERDAY AND STATED THAT SHE HAD BLOODY MUCUS.  STOMACH STILL SORE   PLEASE ADVISE

## 2015-06-11 NOTE — Telephone Encounter (Signed)
Creatinine 0/86 on 05/25/15 Proceed with CT A/P with contrast  Dx: weight loss, abd pain, s/p gallbladder surgery with complications

## 2015-06-12 ENCOUNTER — Other Ambulatory Visit: Payer: Self-pay

## 2015-06-12 DIAGNOSIS — K9189 Other postprocedural complications and disorders of digestive system: Secondary | ICD-10-CM

## 2015-06-12 DIAGNOSIS — R109 Unspecified abdominal pain: Secondary | ICD-10-CM

## 2015-06-12 DIAGNOSIS — R634 Abnormal weight loss: Secondary | ICD-10-CM

## 2015-06-12 NOTE — Telephone Encounter (Signed)
Pt is aware of CT scan appt on 06/16/2015 @ 1630. Pt is aware to pick up contrast

## 2015-06-16 ENCOUNTER — Ambulatory Visit (HOSPITAL_COMMUNITY)
Admission: RE | Admit: 2015-06-16 | Discharge: 2015-06-16 | Disposition: A | Discharge status: Home / Self Care | Payer: Medicare Other | Source: Ambulatory Visit | Attending: Gastroenterology | Admitting: Gastroenterology

## 2015-06-16 DIAGNOSIS — R109 Unspecified abdominal pain: Secondary | ICD-10-CM | POA: Insufficient documentation

## 2015-06-16 DIAGNOSIS — K9189 Other postprocedural complications and disorders of digestive system: Secondary | ICD-10-CM | POA: Insufficient documentation

## 2015-06-16 DIAGNOSIS — M5136 Other intervertebral disc degeneration, lumbar region: Secondary | ICD-10-CM | POA: Insufficient documentation

## 2015-06-16 DIAGNOSIS — R634 Abnormal weight loss: Secondary | ICD-10-CM | POA: Diagnosis not present

## 2015-06-16 DIAGNOSIS — M5137 Other intervertebral disc degeneration, lumbosacral region: Secondary | ICD-10-CM | POA: Insufficient documentation

## 2015-06-16 DIAGNOSIS — R1010 Upper abdominal pain, unspecified: Secondary | ICD-10-CM | POA: Diagnosis not present

## 2015-06-16 MED ORDER — IOPAMIDOL (ISOVUE-300) INJECTION 61%
100.0000 mL | Freq: Once | INTRAVENOUS | Status: AC | PRN
  Administered 2015-06-16: 100 mL via INTRAVENOUS

## 2015-06-22 NOTE — Progress Notes (Signed)
Quick Note:  Please let patient know that her CT does not show any issues related to her previous gallbladder surgery. Nothing to really explain her anemia, weight loss, abdominal pain. She has significant degenerative disc disease present at L4-L5 and L5-S1 with some narrowing of the disc space at L5-S1. This may cause back pain but no abdominal issues.  I would offer the patient at a minimum an upper endoscopy for further evaluation weight loss, postprandial abdominal pain but given her ongoing anemia, recent rectal bleeding, ongoing weight loss could offer her colonoscopy as well. She would require the sedation in the OR due to polypharmacy.  If she having regular bowel movements, if not or her stool is hard we should add a bowel regimen as she did have moderate stool noted on her CT scan ______

## 2015-06-23 ENCOUNTER — Other Ambulatory Visit: Payer: Self-pay | Admitting: Family Medicine

## 2015-06-24 ENCOUNTER — Other Ambulatory Visit: Payer: Self-pay

## 2015-06-24 ENCOUNTER — Other Ambulatory Visit: Payer: Self-pay | Admitting: Family Medicine

## 2015-06-24 MED ORDER — MEGESTROL ACETATE 40 MG PO TABS: 40.0000 mg | ORAL_TABLET | Freq: Every day | ORAL | Status: DC

## 2015-06-26 ENCOUNTER — Other Ambulatory Visit: Payer: Self-pay | Admitting: Family Medicine

## 2015-06-26 ENCOUNTER — Telehealth: Payer: Self-pay | Admitting: Family Medicine

## 2015-06-26 DIAGNOSIS — G894 Chronic pain syndrome: Secondary | ICD-10-CM

## 2015-06-26 NOTE — Progress Notes (Signed)
Quick Note:  Noted. Please schedule her for OV follow up in 6 weeks. ______

## 2015-06-26 NOTE — Telephone Encounter (Signed)
Patient has questions to ask the nurse regarding pain management , please advise?

## 2015-06-26 NOTE — Telephone Encounter (Signed)
Patient would like her recent scan from GI reviewed for her upcoming visit.  She states that she still is not gaining any weight.     She is going to contact pain management about getting her last rx before Dr. Francesco Runner leaves and will need referral when she comes in for ov on 5/25.

## 2015-06-26 NOTE — Telephone Encounter (Signed)
Noted and referral entered  

## 2015-06-30 ENCOUNTER — Encounter: Payer: Self-pay | Admitting: Internal Medicine

## 2015-07-02 ENCOUNTER — Ambulatory Visit (INDEPENDENT_AMBULATORY_CARE_PROVIDER_SITE_OTHER): Payer: Medicare Other | Admitting: Family Medicine

## 2015-07-02 ENCOUNTER — Encounter: Payer: Self-pay | Admitting: Family Medicine

## 2015-07-02 VITALS — BP 122/78 | HR 82 | Resp 16 | Ht 65.0 in | Wt 117.8 lb

## 2015-07-02 DIAGNOSIS — F5105 Insomnia due to other mental disorder: Secondary | ICD-10-CM

## 2015-07-02 DIAGNOSIS — I10 Essential (primary) hypertension: Secondary | ICD-10-CM

## 2015-07-02 DIAGNOSIS — R634 Abnormal weight loss: Secondary | ICD-10-CM

## 2015-07-02 DIAGNOSIS — F418 Other specified anxiety disorders: Secondary | ICD-10-CM | POA: Diagnosis not present

## 2015-07-02 DIAGNOSIS — M5440 Lumbago with sciatica, unspecified side: Secondary | ICD-10-CM

## 2015-07-02 DIAGNOSIS — R630 Anorexia: Secondary | ICD-10-CM

## 2015-07-02 DIAGNOSIS — B009 Herpesviral infection, unspecified: Secondary | ICD-10-CM

## 2015-07-02 DIAGNOSIS — K2100 Gastro-esophageal reflux disease with esophagitis, without bleeding: Secondary | ICD-10-CM

## 2015-07-02 DIAGNOSIS — F341 Dysthymic disorder: Secondary | ICD-10-CM

## 2015-07-02 MED ORDER — VALSARTAN 80 MG PO TABS: 80.0000 mg | ORAL_TABLET | Freq: Every day | ORAL | Status: DC

## 2015-07-02 MED ORDER — MEGESTROL ACETATE 40 MG PO TABS: 40.0000 mg | ORAL_TABLET | Freq: Every day | ORAL | Status: DC

## 2015-07-02 MED ORDER — CITALOPRAM HYDROBROMIDE 20 MG PO TABS: 20.0000 mg | ORAL_TABLET | Freq: Every day | ORAL | Status: DC

## 2015-07-02 NOTE — Patient Instructions (Addendum)
F/u with rectal in 4 month, call if you need me before  Dose of citalopram is 20 mg daily, take two 10 mg daily till done  Continue all other medication as before  You are referred to pain clinic in [place]  Thank you  for choosing [facility]. We consider it a privelige to serve you.  Delivering excellent health care in a caring and  compassionate way is our goal.  Partnering with you,  so that together we can achieve this goal is our strategy.

## 2015-07-02 NOTE — Progress Notes (Signed)
   Subjective:    Patient ID: Renee Waters, female    DOB: 1945-05-05, 70 y.o.   MRN: [id number]  HPI   [name]     MRN: [id number]      DOB: 07-15-1945   HPI Ms. Croghan is here for follow up and re-evaluation of chronic medical conditions, medication management and review of any available recent lab and radiology data.  Preventive health is updated, specifically  Cancer screening and Immunization.   Questions or concerns regarding consultations or procedures which the PT has had in the interim are  Addressed.Needs new pain management professional, tearfully requests that I assume pain magaement once more, not really taking meds as prescribed, has received 3 month supply from previous Provider, NEEDs the structure of a pain clinic The PT denies any adverse reactions to current medications since the last visit.  T   ROS Denies recent fever or chills. Denies sinus pressure, nasal congestion, ear pain or sore throat. Denies chest congestion, productive cough or wheezing. Denies chest pains, palpitations and leg swelling Denies abdominal pain, nausea, vomiting,diarrhea or constipation.   Denies dysuria, frequency, hesitancy or incontinence. C/o  joint pain, swelling and limitation in mobility. Denies headaches, seizures, numbness, or tingling. c/o depression, anxiety and  Insomnia, but adequately managed with medication, however cries easily, and still no interest in therapy Denies skin break down or rash.   PE  BP 122/78 mmHg  Pulse 82  Resp 16  Ht 5\' 5"  (1.651 m)  Wt 117 lb 12.8 oz (53.434 kg)  BMI 19.60 kg/m2  SpO2 97%  Patient alert and oriented and in no cardiopulmonary distress.  HEENT: No facial asymmetry, EOMI,   oropharynx pink and moist.  Neck supple no JVD, no mass.  Chest: Clear to auscultation bilaterally.  CVS: S1, S2 no murmurs, no S3.Regular rate.  ABD: Soft non tender.   Ext: No edema  MS: decreased ROM spine, shoulders, hips and knees  Skin:  Intact, no ulcerations or rash noted.  Psych: Good eye contact, . Memory intact not anxious or depressed appearing.Labile mood  CNS: CN 2-12 intact, power,  normal throughout.no focal deficits noted.   Assessment & Plan   Essential hypertension Controlled, no change in medication   GERD (gastroesophageal reflux disease) Managed by GI, and controlled curently  Depression with anxiety Labile mood, not suicidal or homicidal, increase fluoxetine to 20 mg daily  Loss of weight Stable currently, continue megace  Poor appetite Daily megace, re eval in 4 months  Western blot positive HSV2 Continue as needed acyclovir  Insomnia secondary to depression with anxiety Sleep hygiene reviewed and written information offered also. Prescription sent for  medication needed.   Backache Chronic and ubnchanged, pt needs to establish with new Pain specialist, medicationm reviewed at visit, and she has concerns about dosing, not taking as directed       Review of Systems     Objective:   Physical Exam        Assessment & Plan:

## 2015-07-03 NOTE — Assessment & Plan Note (Signed)
Stable currently, continue megace

## 2015-07-03 NOTE — Assessment & Plan Note (Addendum)
Labile mood, not suicidal or homicidal, increase fluoxetine to 20 mg daily

## 2015-07-03 NOTE — Assessment & Plan Note (Signed)
Controlled, no change in medication  

## 2015-07-03 NOTE — Assessment & Plan Note (Signed)
Continue as needed acyclovir

## 2015-07-03 NOTE — Assessment & Plan Note (Signed)
Sleep hygiene reviewed and written information offered also. Prescription sent for  medication needed.  

## 2015-07-03 NOTE — Assessment & Plan Note (Signed)
Daily megace, re eval in 4 months

## 2015-07-03 NOTE — Assessment & Plan Note (Signed)
Managed by GI, and controlled curently

## 2015-07-03 NOTE — Assessment & Plan Note (Signed)
Chronic and ubnchanged, pt needs to establish with new Pain specialist, medicationm reviewed at visit, and she has concerns about dosing, not taking as directed

## 2015-07-24 ENCOUNTER — Other Ambulatory Visit: Payer: Self-pay | Admitting: Family Medicine

## 2015-07-29 ENCOUNTER — Other Ambulatory Visit: Payer: Self-pay | Admitting: Family Medicine

## 2015-08-17 ENCOUNTER — Ambulatory Visit (INDEPENDENT_AMBULATORY_CARE_PROVIDER_SITE_OTHER): Payer: Medicare Other | Admitting: Gastroenterology

## 2015-08-17 ENCOUNTER — Encounter: Payer: Self-pay | Admitting: Gastroenterology

## 2015-08-17 VITALS — BP 154/76 | HR 70 | Temp 99.2°F | Wt 111.5 lb

## 2015-08-17 DIAGNOSIS — K5900 Constipation, unspecified: Secondary | ICD-10-CM | POA: Diagnosis not present

## 2015-08-17 DIAGNOSIS — R634 Abnormal weight loss: Secondary | ICD-10-CM

## 2015-08-17 DIAGNOSIS — R1011 Right upper quadrant pain: Secondary | ICD-10-CM | POA: Diagnosis not present

## 2015-08-17 DIAGNOSIS — D509 Iron deficiency anemia, unspecified: Secondary | ICD-10-CM

## 2015-08-17 MED ORDER — LINACLOTIDE 72 MCG PO CAPS: 72.0000 ug | ORAL_CAPSULE | Freq: Every day | ORAL | Status: DC

## 2015-08-17 NOTE — Progress Notes (Signed)
cc'ed to pcp °

## 2015-08-17 NOTE — Progress Notes (Addendum)
Primary Care Physician: Tula Nakayama, MD  Primary Gastroenterologist:  Garfield Cornea, MD   Chief Complaint  Patient presents with  . Weight Loss  . Abdominal Pain    HPI: Renee Waters is a 70 y.o. female here For follow-up. Last seen in April 2017. History of abdominal pain and weight loss.Last seen EGD for Barrett's esophagus surveillance back in February 2016. No dysplasia on biopsies. 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 Complicated past medical history including hospitalization back in December 2016 with sepsis, acute renal insufficiency, gangrenous gallbladder. She was hospitalized for 3 weeks. She required surgical intervention and management of gangrenous cholecystitis with gallbladder rupture and small bowel obstruction. Dropped 20 pounds with her illness. 110 pounds at time of discharge. When I saw her back in April she was continued to have some right upper quadrant discomfort with meals and with palpation. Ongoing iron deficiency anemia. Given patient was having some slow improvement, we continued to monitor her. We touched base with her torso and of April and she felt like she was getting better in her abdominal pain had improved. She called in May stating she had some further abdominal pain and bright red blood per rectum. CT scan abdomen with contrast was ordered at that time given her complicated history. No significant findings on CT.  In May her weight was 117 pounds, down to 111 pounds today. Weight prior to illness was 126 pounds back in November 2016.  Patient very upset about ongoing weight loss. Tearful throughout visit. Feels like she is taking in sufficient amount of food. Eating out a lot. Dinner usually starch, meat, 2 veggies. Small lunch at home. Breakfast consists up bagel/fruit or eggs and toast. No abdominal pain with meals. Continues to complain of soreness and numbness at her ruq incision. Especially with movement and palpation. BM difficult,  constipation. Tried fiber supplement without relief. Senna works but has to take 4 and she has to take daily. States she does not take pain medication every day for her back.    Megace started couple of months ago.   Current Outpatient Prescriptions  Medication Sig Dispense Refill  . ALPRAZolam (XANAX) 0.5 MG tablet TAKE 1 TABLET BY MOUTH ONCE DAILY 30 tablet 2  . busPIRone (BUSPAR) 7.5 MG tablet TAKE 1 TABLET BY MOUTH 3 TIMES DAILY 90 tablet 2  . citalopram (CELEXA) 20 MG tablet Take 1 tablet (20 mg total) by mouth daily. 30 tablet 5  . diphenoxylate-atropine (LOMOTIL) 2.5-0.025 MG tablet oNE TABLET  UP TO 4  TIMES DAILY AS NEEDED FOR LOOSE STOOL 20 tablet 0  . gabapentin (NEURONTIN) 600 MG tablet Take 600 mg by mouth 2 (two) times daily.    . megestrol (MEGACE) 40 MG tablet Take 1 tablet (40 mg total) by mouth daily. 30 tablet 3  . Multiple Vitamins-Minerals (MULTIVITAMINS THER. W/MINERALS) TABS Take 1 tablet by mouth daily.     Marland Kitchen omeprazole (PRILOSEC) 40 MG capsule TAKE 1 CAPSULE BY MOUTH ONCE DAILY 30 capsule 2  . Oxycodone HCl 10 MG TABS Take 10 mg by mouth. 1 tab every 6 hours as needed    . temazepam (RESTORIL) 30 MG capsule TAKE 1 CAPSULE BY MOUTH ONCE DAILY EVERY NIGHT AT BEDTIME 30 capsule 1  . tiZANidine (ZANAFLEX) 4 MG tablet TAKE 1 TABLET BY MOUTH ONCE DAILY AT BEDTIME 30 tablet 2  . valsartan (DIOVAN) 80 MG tablet Take 1 tablet (80 mg total) by mouth daily. 30 tablet 4  . Vitamin D,  Ergocalciferol, (DRISDOL) 50000 units CAPS capsule TAKE 1 CAPSULE BY MOUTH ONCE A WEEK 4 capsule 3   No current facility-administered medications for this visit.    Allergies as of 08/17/2015 - Review Complete 08/17/2015  Allergen Reaction Noted  . Tylox [oxycodone-acetaminophen] Other (See Comments) 09/27/2010  . Bee venom Swelling 06/12/2012   Past Medical History  Diagnosis Date  . Insomnia   . Chronic back pain   . Chronic neck pain   . Depression   . GERD (gastroesophageal reflux  disease)   . Osteoarthritis   . Genital herpes   . Hypertension   . S/P colonoscopy June 2005    normal, no polyps  . S/P endoscopy June 2005, Oct 2009    2005: short-segment Barrett's, 2009: short-segment Barrett's  . Barrett's esophagus   . UTI (lower urinary tract infection) 10/14    currently on Penicillin-  states is improving  . History of blood transfusion   . Anemia    Past Surgical History  Procedure Laterality Date  . Cervical disectomy  2002  . Shoulder arthroscopy  2008    left  . Right hip replacement  07/2010    went back in sept 2012 to fix  . Hernia repair  07/2010    Dr. Zada Girt  . Abdominal hysterectomy    . Esophagogastroduodenoscopy  11/29/2007    salmon-colored  tongue   longest stable at  3 cm, distal esophagus as described previously status post biopsy/ Hiatal hernia, otherwise normal stomach D1 and D2  . Esophagogastroduodenoscopy  01/06/11    short segment Barrett's esophagus s/p bx/Hiatal hernia  . Colonoscopy  11/09/2011    RMR: Melanosis coli  . Total hip revision Right 12/17/2012    Procedure: RIGHT TOTAL HIP REVISION;  Surgeon: Mauri Pole, MD;  Location: [facility];  Service: Orthopedics;  Laterality: Right;  . Carpal tunnel release Left 2013  . Wrist surgery Right 2011  . Esophagogastroduodenoscopy (egd) with propofol N/A 03/20/2014    [id number] distal esophagus short segment barrett's, bx with no dysplasia. next egd in 03/2017  . Biopsy N/A 03/20/2014    Procedure: BIOPSY;  Surgeon: Daneil Dolin, MD;  Location: [facility];  Service: Endoscopy;  Laterality: N/A;  . Cholecystectomy      with lysis of adhesions for sbo    ROS:  General: Negative for anorexia,  fever, chills, fatigue, weakness. See hpi. ENT: Negative for hoarseness, difficulty swallowing , nasal congestion. CV: Negative for chest pain, angina, palpitations, dyspnea on exertion, peripheral edema.  Respiratory: Negative for dyspnea at rest, dyspnea on exertion, cough, sputum,  wheezing.  GI: See history of present illness. GU:  Negative for dysuria, hematuria, urinary incontinence, urinary frequency, nocturnal urination.  Endo: see hpi   Physical Examination:   BP 154/76 mmHg  Pulse 70  Temp(Src) 99.2 F (37.3 C)  Wt 111 lb 8 oz (50.576 kg)  General: Well-nourished, well-developed in no acute distress. Tearful. Eyes: No icterus. Mouth: Oropharyngeal mucosa moist and pink , no lesions erythema or exudate. Lungs: Clear to auscultation bilaterally.  Heart: Regular rate and rhythm, no murmurs rubs or gallops.  Abdomen: Bowel sounds are normal,  nondistended, no hepatosplenomegaly or masses, no abdominal bruits or hernia , no rebound or guarding.  ruq long incision with some thickness. Mild tenderness with palpation.  Extremities: No lower extremity edema. No clubbing or deformities. Neuro: Alert and oriented x 4   Skin: Warm and dry, no jaundice.   Psych: Alert and cooperative, normal  mood and affect.  Labs:  Reviewed labs from 05/2015. Requested most recent labs.   Imaging Studies: No results found.     No change; TCS with possible EGD to follow per plan. The risks, benefits, limitations, imponderables and alternatives regarding both EGD and colonoscopy have been reviewed with the patient. Questions have been answered. All parties agreeable.

## 2015-08-17 NOTE — Patient Instructions (Signed)
1. For constipation, start Linzess 55mcg daily on empty stomach. 10 days of samples provided. Call for prescription if you want to continue. If not effective please call and let us know a week and make changes. 2. I will review your latest labs and determine next step.

## 2015-08-17 NOTE — Assessment & Plan Note (Signed)
Complains of pain at incision with numbness. Discussed with patient, likely continued improvement with time but if not, we could consider sending to PT for abdominal wall pain. She will let us know if she wants to pursue but doesn't bother her that much right now. She is mostly concerned about weight loss. Appetite is good. She reports eating numerous snacks daily and eats three meals. Consumes nutrition drinks as well. We have requested most recent labs for review. Needs updated thyroid function test, may require fasting am cortisol level. CXR 7 months ago with no evidence of malignancy and recent CT A/P with contrast reassuring.

## 2015-08-18 NOTE — Progress Notes (Signed)
Letter and Lab orders mailed to the pt.

## 2015-08-18 NOTE — Addendum Note (Signed)
Addended by: Mahala Menghini on: 08/18/2015 09:09 AM   Modules accepted: Orders

## 2015-08-18 NOTE — Progress Notes (Signed)
Please let patient know that her most recent labs were from 05/2015.   She needs to have the following labs done, I placed orders in [place].  CBC, TSH, iron/tibc, ferritin, fasting AM cortisol level.  SHE NEEDS TO FAST FOR THE AM CORTISOL LEVEL.

## 2015-08-25 ENCOUNTER — Other Ambulatory Visit: Payer: Self-pay

## 2015-08-25 ENCOUNTER — Other Ambulatory Visit: Payer: Self-pay | Admitting: Gastroenterology

## 2015-08-25 DIAGNOSIS — R634 Abnormal weight loss: Secondary | ICD-10-CM | POA: Diagnosis not present

## 2015-08-25 DIAGNOSIS — D509 Iron deficiency anemia, unspecified: Secondary | ICD-10-CM

## 2015-08-25 LAB — CBC WITH DIFFERENTIAL/PLATELET
Basophils Absolute: 0 cells/uL (ref 0–200)
Basophils Relative: 0 %
EOS ABS: 260 {cells}/uL (ref 15–500)
EOS PCT: 2 %
HEMATOCRIT: 35.5 % (ref 35.0–45.0)
Hemoglobin: 11.4 g/dL — ABNORMAL LOW (ref 11.7–15.5)
LYMPHS PCT: 14 %
Lymphs Abs: 1820 cells/uL (ref 850–3900)
MCH: 28.6 pg (ref 27.0–33.0)
MCHC: 32.1 g/dL (ref 32.0–36.0)
MCV: 89 fL (ref 80.0–100.0)
MONOS PCT: 4 %
MPV: 10.8 fL (ref 7.5–12.5)
Monocytes Absolute: 520 cells/uL (ref 200–950)
NEUTROS ABS: 10400 {cells}/uL — AB (ref 1500–7800)
Neutrophils Relative %: 80 %
PLATELETS: 212 10*3/uL (ref 140–400)
RBC: 3.99 MIL/uL (ref 3.80–5.10)
RDW: 21.2 % — AB (ref 11.0–15.0)
WBC: 13 10*3/uL — AB (ref 3.8–10.8)

## 2015-08-26 ENCOUNTER — Other Ambulatory Visit: Payer: Self-pay | Admitting: Family Medicine

## 2015-08-26 LAB — CORTISOL-AM, BLOOD: CORTISOL - AM: 4.2 ug/dL

## 2015-08-26 LAB — IRON AND TIBC
%SAT: 8 % — ABNORMAL LOW (ref 11–50)
Iron: 27 ug/dL — ABNORMAL LOW (ref 45–160)
TIBC: 329 ug/dL (ref 250–450)
UIBC: 302 ug/dL (ref 125–400)

## 2015-08-26 LAB — TSH: TSH: 1.76 m[IU]/L

## 2015-08-26 LAB — FERRITIN: Ferritin: 26 ng/mL (ref 20–288)

## 2015-08-31 NOTE — Progress Notes (Signed)
Mild anemia with trend towards IDA. Thyroid and fasting am cortisol within normal range.  Her WBC is slightly elevated, unclear significance. Much improved from 01/2015 when in the hospital.   Nothing to explain ongoing weight loss.  -->I would recommend TCS+/-EGD for IDA/weight loss with RMR in OR for h/o polypharmacy.  -->Repeat CBC with diff in 6 weeks.

## 2015-09-01 ENCOUNTER — Other Ambulatory Visit: Payer: Self-pay | Admitting: Gastroenterology

## 2015-09-01 ENCOUNTER — Other Ambulatory Visit: Payer: Self-pay

## 2015-09-01 DIAGNOSIS — D509 Iron deficiency anemia, unspecified: Secondary | ICD-10-CM

## 2015-09-01 MED ORDER — PEG 3350-KCL-NA BICARB-NACL 420 G PO SOLR: 4000.0000 mL | ORAL | 0 refills | Status: DC

## 2015-09-02 ENCOUNTER — Other Ambulatory Visit: Payer: Self-pay

## 2015-09-02 DIAGNOSIS — R634 Abnormal weight loss: Secondary | ICD-10-CM

## 2015-09-02 DIAGNOSIS — D509 Iron deficiency anemia, unspecified: Secondary | ICD-10-CM

## 2015-09-03 ENCOUNTER — Other Ambulatory Visit: Payer: Self-pay | Admitting: Family Medicine

## 2015-09-03 DIAGNOSIS — G44021 Chronic cluster headache, intractable: Secondary | ICD-10-CM

## 2015-09-07 NOTE — Patient Instructions (Signed)
Renee Waters  09/07/2015     @PREFPERIOPPHARMACY @   Your procedure is scheduled on 09/14/2015  Report to [name] [place].  Call this number if you have problems the morning of surgery:  [phone]   Remember:  Do not eat food or drink liquids after midnight.  Take these medicines the morning of surgery with A SIP OF WATER Xanax, Buspar, Celexa, Gabapentin, Megace, Prilosec, Oxycodone if needed, Zanaflex, Topamax   Do not wear jewelry, make-up or nail polish.  Do not wear lotions, powders, or perfumes.  You may wear deoderant.  Do not shave 48 hours prior to surgery.  Men may shave face and neck.  Do not bring valuables to the hospital.  [facility] is not responsible for any belongings or valuables.  Contacts, dentures or bridgework may not be worn into surgery.  Leave your suitcase in the car.  After surgery it may be brought to your room.  For patients admitted to the hospital, discharge time will be determined by your treatment team.  Patients discharged the day of surgery will not be allowed to drive home.    Please read over the following fact sheets that you were given. Anesthesia Post-op Instructions     PATIENT INSTRUCTIONS POST-ANESTHESIA  IMMEDIATELY FOLLOWING SURGERY:  Do not drive or operate machinery for the first twenty four hours after surgery.  Do not make any important decisions for twenty four hours after surgery or while taking narcotic pain medications or sedatives.  If you develop intractable nausea and vomiting or a severe headache please notify your doctor immediately.  FOLLOW-UP:  Please make an appointment with your surgeon as instructed. You do not need to follow up with anesthesia unless specifically instructed to do so.  WOUND CARE INSTRUCTIONS (if applicable):  Keep a dry clean dressing on the anesthesia/puncture wound site if there is drainage.  Once the wound has quit draining you may leave it open to air.  Generally you should leave  the bandage intact for twenty four hours unless there is drainage.  If the epidural site drains for more than 36-48 hours please call the anesthesia department.  QUESTIONS?:  Please feel free to call your physician or the hospital operator if you have any questions, and they will be happy to assist you.      Esophagogastroduodenoscopy Esophagogastroduodenoscopy (EGD) is a procedure that is used to examine the lining of the esophagus, stomach, and first part of the small intestine (duodenum). A long, flexible, lighted tube with a camera attached (endoscope) is inserted down the throat to view these organs. This procedure is done to detect problems or abnormalities, such as inflammation, bleeding, ulcers, or growths, in order to treat them. The procedure lasts 5-20 minutes. It is usually an outpatient procedure, but it may need to be performed in a hospital in emergency cases. LET [facility] CARE PROVIDER KNOW ABOUT:  Any allergies you have.  All medicines you are taking, including vitamins, herbs, eye drops, creams, and over-the-counter medicines.  Previous problems you or members of your family have had with the use of anesthetics.  Any blood disorders you have.  Previous surgeries you have had.  Medical conditions you have. RISKS AND COMPLICATIONS Generally, this is a safe procedure. However, problems can occur and include:  Infection.  Bleeding.  Tearing (perforation) of the esophagus, stomach, or duodenum.  Difficulty breathing or not being able to breathe.  Excessive sweating.  Spasms of the larynx.  Slowed heartbeat.  Low blood pressure. BEFORE THE PROCEDURE  Do not eat or drink anything after midnight on the night before the procedure or as directed by your health care provider.  Do not take your regular medicines before the procedure if your health care provider asks you not to. Ask your health care provider about changing or stopping those medicines.  If you wear  dentures, be prepared to remove them before the procedure.  Arrange for someone to drive you home after the procedure. PROCEDURE  A numbing medicine (local anesthetic) may be sprayed in your throat for comfort and to stop you from gagging or coughing.  You will have an IV tube inserted in a vein in your hand or arm. You will receive medicines and fluids through this tube.  You will be given a medicine to relax you (sedative).  A pain reliever will be given through the IV tube.  A mouth guard may be placed in your mouth to protect your teeth and to keep you from biting on the endoscope.  You will be asked to lie on your left side.  The endoscope will be inserted down your throat and into your esophagus, stomach, and duodenum.  Air will be put through the endoscope to allow your health care provider to clearly view the lining of your esophagus.  The lining of your esophagus, stomach, and duodenum will be examined. During the exam, your health care provider may:  Remove tissue to be examined under a microscope (biopsy) for inflammation, infection, or other medical problems.  Remove growths.  Remove objects (foreign bodies) that are stuck.  Treat any bleeding with medicines or other devices that stop tissues from bleeding (hot cautery, clipping devices).  Widen (dilate) or stretch narrowed areas of your esophagus and stomach.  The endoscope will be withdrawn. AFTER THE PROCEDURE  You will be taken to a recovery area for observation. Your blood pressure, heart rate, breathing rate, and blood oxygen level will be monitored often until the medicines you were given have worn off.  Do not eat or drink anything until the numbing medicine has worn off and your gag reflex has returned. You may choke.  Your health care provider should be able to discuss his or her findings with you. It will take longer to discuss the test results if any biopsies were taken.   This information is not  intended to replace advice given to you by your health care provider. Make sure you discuss any questions you have with your health care provider.   Document Released: 05/27/2004 Document Revised: 02/14/2014 Document Reviewed: 12/28/2011 Elsevier Interactive Patient Education 2016 [employer]. Colonoscopy A colonoscopy is an exam to look at the entire large intestine (colon). This exam can help find problems such as tumors, polyps, inflammation, and areas of bleeding. The exam takes about 1 hour.  LET [facility] CARE PROVIDER KNOW ABOUT:   Any allergies you have.  All medicines you are taking, including vitamins, herbs, eye drops, creams, and over-the-counter medicines.  Previous problems you or members of your family have had with the use of anesthetics.  Any blood disorders you have.  Previous surgeries you have had.  Medical conditions you have. RISKS AND COMPLICATIONS  Generally, this is a safe procedure. However, as with any procedure, complications can occur. Possible complications include:  Bleeding.  Tearing or rupture of the colon wall.  Reaction to medicines given during the exam.  Infection (rare). BEFORE THE PROCEDURE   Ask your health care provider  about changing or stopping your regular medicines.  You may be prescribed an oral bowel prep. This involves drinking a large amount of medicated liquid, starting the day before your procedure. The liquid will cause you to have multiple loose stools until your stool is almost clear or light green. This cleans out your colon in preparation for the procedure.  Do not eat or drink anything else once you have started the bowel prep, unless your health care provider tells you it is safe to do so.  Arrange for someone to drive you home after the procedure. PROCEDURE   You will be given medicine to help you relax (sedative).  You will lie on your side with your knees bent.  A long, flexible tube with a light and camera  on the end (colonoscope) will be inserted through the rectum and into the colon. The camera sends video back to a computer screen as it moves through the colon. The colonoscope also releases carbon dioxide gas to inflate the colon. This helps your health care provider see the area better.  During the exam, your health care provider may take a small tissue sample (biopsy) to be examined under a microscope if any abnormalities are found.  The exam is finished when the entire colon has been viewed. AFTER THE PROCEDURE   Do not drive for 24 hours after the exam.  You may have a small amount of blood in your stool.  You may pass moderate amounts of gas and have mild abdominal cramping or bloating. This is caused by the gas used to inflate your colon during the exam.  Ask when your test results will be ready and how you will get your results. Make sure you get your test results.   This information is not intended to replace advice given to you by your health care provider. Make sure you discuss any questions you have with your health care provider.   Document Released: 01/22/2000 Document Revised: 11/14/2012 Document Reviewed: 10/01/2012 Elsevier Interactive Patient Education [employer].

## 2015-09-09 ENCOUNTER — Other Ambulatory Visit: Payer: Self-pay

## 2015-09-09 ENCOUNTER — Encounter (HOSPITAL_COMMUNITY)
Admission: RE | Admit: 2015-09-09 | Discharge: 2015-09-09 | Disposition: A | Discharge status: Home / Self Care | Payer: Medicare Other | Source: Ambulatory Visit | Attending: Internal Medicine | Admitting: Internal Medicine

## 2015-09-09 ENCOUNTER — Encounter (HOSPITAL_COMMUNITY): Payer: Self-pay

## 2015-09-09 DIAGNOSIS — Z01812 Encounter for preprocedural laboratory examination: Secondary | ICD-10-CM | POA: Diagnosis not present

## 2015-09-09 DIAGNOSIS — Z01810 Encounter for preprocedural cardiovascular examination: Secondary | ICD-10-CM | POA: Insufficient documentation

## 2015-09-09 LAB — BASIC METABOLIC PANEL
ANION GAP: 2 — AB (ref 5–15)
BUN: 18 mg/dL (ref 6–20)
CO2: 21 mmol/L — AB (ref 22–32)
Calcium: 8.1 mg/dL — ABNORMAL LOW (ref 8.9–10.3)
Chloride: 111 mmol/L (ref 101–111)
Creatinine, Ser: 0.88 mg/dL (ref 0.44–1.00)
GFR calc Af Amer: >60 mL/min (ref >60)
GFR calc non Af Amer: >60 mL/min (ref >60)
GLUCOSE: 89 mg/dL (ref 65–99)
POTASSIUM: 3.9 mmol/L (ref 3.5–5.1)
Sodium: 134 mmol/L — ABNORMAL LOW (ref 135–145)

## 2015-09-09 NOTE — Pre-Procedure Instructions (Signed)
Patient given information to sign up for my chart at home. 

## 2015-09-10 ENCOUNTER — Telehealth: Payer: Self-pay | Admitting: Internal Medicine

## 2015-09-10 NOTE — Telephone Encounter (Signed)
I called patient and she is aware prescription was called into [facility]

## 2015-09-10 NOTE — Telephone Encounter (Signed)
Renee Waters, please let pt know Ginger sent her Rx to [facility] on 09/01/2015.  It appears receipt was confirmed by the pharmacy.  Thanks!

## 2015-09-10 NOTE — Telephone Encounter (Signed)
Pt called to say that she went to her pre op yesterday and we needed to send her prep prescription to [facility]. Her procedure is Monday.

## 2015-09-14 ENCOUNTER — Encounter (HOSPITAL_COMMUNITY)
Admission: RE | Disposition: A | Discharge status: Home / Self Care | Payer: Self-pay | Source: Ambulatory Visit | Attending: Internal Medicine

## 2015-09-14 ENCOUNTER — Encounter (HOSPITAL_COMMUNITY): Payer: Self-pay | Admitting: *Deleted

## 2015-09-14 ENCOUNTER — Ambulatory Visit (HOSPITAL_COMMUNITY): Payer: Medicare Other | Admitting: Anesthesiology

## 2015-09-14 ENCOUNTER — Ambulatory Visit (HOSPITAL_COMMUNITY)
Admission: RE | Admit: 2015-09-14 | Discharge: 2015-09-14 | Disposition: A | Discharge status: Home / Self Care | Payer: Medicare Other | Source: Ambulatory Visit | Attending: Internal Medicine | Admitting: Internal Medicine

## 2015-09-14 DIAGNOSIS — K228 Other specified diseases of esophagus: Secondary | ICD-10-CM

## 2015-09-14 DIAGNOSIS — Z79899 Other long term (current) drug therapy: Secondary | ICD-10-CM | POA: Insufficient documentation

## 2015-09-14 DIAGNOSIS — Z9049 Acquired absence of other specified parts of digestive tract: Secondary | ICD-10-CM | POA: Diagnosis not present

## 2015-09-14 DIAGNOSIS — K2270 Barrett's esophagus without dysplasia: Secondary | ICD-10-CM | POA: Insufficient documentation

## 2015-09-14 DIAGNOSIS — D122 Benign neoplasm of ascending colon: Secondary | ICD-10-CM | POA: Insufficient documentation

## 2015-09-14 DIAGNOSIS — K219 Gastro-esophageal reflux disease without esophagitis: Secondary | ICD-10-CM | POA: Insufficient documentation

## 2015-09-14 DIAGNOSIS — F329 Major depressive disorder, single episode, unspecified: Secondary | ICD-10-CM | POA: Diagnosis not present

## 2015-09-14 DIAGNOSIS — D649 Anemia, unspecified: Secondary | ICD-10-CM | POA: Diagnosis not present

## 2015-09-14 DIAGNOSIS — R634 Abnormal weight loss: Secondary | ICD-10-CM

## 2015-09-14 DIAGNOSIS — K2950 Unspecified chronic gastritis without bleeding: Secondary | ICD-10-CM | POA: Insufficient documentation

## 2015-09-14 DIAGNOSIS — D509 Iron deficiency anemia, unspecified: Secondary | ICD-10-CM

## 2015-09-14 DIAGNOSIS — K2970 Gastritis, unspecified, without bleeding: Secondary | ICD-10-CM

## 2015-09-14 DIAGNOSIS — K449 Diaphragmatic hernia without obstruction or gangrene: Secondary | ICD-10-CM | POA: Diagnosis not present

## 2015-09-14 DIAGNOSIS — Z96643 Presence of artificial hip joint, bilateral: Secondary | ICD-10-CM | POA: Diagnosis not present

## 2015-09-14 DIAGNOSIS — Z91030 Bee allergy status: Secondary | ICD-10-CM | POA: Insufficient documentation

## 2015-09-14 DIAGNOSIS — Z885 Allergy status to narcotic agent status: Secondary | ICD-10-CM | POA: Diagnosis not present

## 2015-09-14 DIAGNOSIS — Z87891 Personal history of nicotine dependence: Secondary | ICD-10-CM | POA: Insufficient documentation

## 2015-09-14 DIAGNOSIS — Z90710 Acquired absence of both cervix and uterus: Secondary | ICD-10-CM | POA: Diagnosis not present

## 2015-09-14 DIAGNOSIS — K5730 Diverticulosis of large intestine without perforation or abscess without bleeding: Secondary | ICD-10-CM | POA: Insufficient documentation

## 2015-09-14 DIAGNOSIS — I10 Essential (primary) hypertension: Secondary | ICD-10-CM | POA: Diagnosis not present

## 2015-09-14 HISTORY — PX: BIOPSY: SHX5522

## 2015-09-14 HISTORY — PX: COLONOSCOPY WITH PROPOFOL: SHX5780

## 2015-09-14 HISTORY — PX: POLYPECTOMY: SHX5525

## 2015-09-14 HISTORY — PX: ESOPHAGOGASTRODUODENOSCOPY (EGD) WITH PROPOFOL: SHX5813

## 2015-09-14 SURGERY — COLONOSCOPY WITH PROPOFOL
Anesthesia: Monitor Anesthesia Care

## 2015-09-14 MED ORDER — MIDAZOLAM HCL 5 MG/5ML IJ SOLN
INTRAMUSCULAR | Status: DC | PRN
  Administered 2015-09-14: 2 mg via INTRAVENOUS

## 2015-09-14 MED ORDER — MIDAZOLAM HCL 2 MG/2ML IJ SOLN
1.0000 mg | INTRAMUSCULAR | Status: DC | PRN
  Administered 2015-09-14: 2 mg via INTRAVENOUS

## 2015-09-14 MED ORDER — FENTANYL CITRATE (PF) 100 MCG/2ML IJ SOLN: 25.0000 ug | INTRAMUSCULAR | Status: DC | PRN

## 2015-09-14 MED ORDER — LACTATED RINGERS IV SOLN
INTRAVENOUS | Status: DC
  Administered 2015-09-14: 1000 mL via INTRAVENOUS

## 2015-09-14 MED ORDER — FENTANYL CITRATE (PF) 100 MCG/2ML IJ SOLN
25.0000 ug | INTRAMUSCULAR | Status: DC | PRN
  Administered 2015-09-14: 25 ug via INTRAVENOUS

## 2015-09-14 MED ORDER — PROPOFOL 10 MG/ML IV BOLUS
INTRAVENOUS | Status: AC
  Filled 2015-09-14: qty 20

## 2015-09-14 MED ORDER — PROPOFOL 10 MG/ML IV BOLUS
INTRAVENOUS | Status: AC
  Filled 2015-09-14: qty 40

## 2015-09-14 MED ORDER — SODIUM CHLORIDE 0.9 % IJ SOLN
INTRAMUSCULAR | Status: AC
  Filled 2015-09-14: qty 10

## 2015-09-14 MED ORDER — PROPOFOL 500 MG/50ML IV EMUL
INTRAVENOUS | Status: DC | PRN
  Administered 2015-09-14: 08:00:00 via INTRAVENOUS
  Administered 2015-09-14: 125 ug/kg/min via INTRAVENOUS

## 2015-09-14 MED ORDER — MIDAZOLAM HCL 2 MG/2ML IJ SOLN
INTRAMUSCULAR | Status: AC
  Filled 2015-09-14: qty 2

## 2015-09-14 MED ORDER — EPHEDRINE SULFATE 50 MG/ML IJ SOLN
INTRAMUSCULAR | Status: AC
  Filled 2015-09-14: qty 1

## 2015-09-14 MED ORDER — LIDOCAINE VISCOUS 2 % MT SOLN
OROMUCOSAL | Status: AC
  Filled 2015-09-14: qty 15

## 2015-09-14 MED ORDER — LIDOCAINE VISCOUS 2 % MT SOLN
3.0000 mL | OROMUCOSAL | Status: DC | PRN
  Administered 2015-09-14: 6 mL via OROMUCOSAL

## 2015-09-14 MED ORDER — FENTANYL CITRATE (PF) 100 MCG/2ML IJ SOLN
INTRAMUSCULAR | Status: AC
  Filled 2015-09-14: qty 2

## 2015-09-14 MED ORDER — ONDANSETRON HCL 4 MG/2ML IJ SOLN: 4.0000 mg | Freq: Once | INTRAMUSCULAR | Status: DC | PRN

## 2015-09-14 NOTE — Transfer of Care (Signed)
Immediate Anesthesia Transfer of Care Note  Patient: Renee Waters  Procedure(s) Performed: Procedure(s) with comments: COLONOSCOPY WITH PROPOFOL (N/A) - 730 ESOPHAGOGASTRODUODENOSCOPY (EGD) WITH PROPOFOL (N/A) POLYPECTOMY - ascending colon BIOPSY - esophageal and gastric  Patient Location: PACU  Anesthesia Type:MAC  Level of Consciousness: awake and patient cooperative  Airway & Oxygen Therapy: Patient Spontanous Breathing and Patient connected to face mask oxygen  Post-op Assessment: Report given to RN, Post -op Vital signs reviewed and stable and Patient moving all extremities  Post vital signs: Reviewed and stable  Last Vitals:  Vitals:   09/14/15 0730 09/14/15 0735  BP: (!) 168/86 (!) 156/92  Resp: 15 15  Temp:      Last Pain:  Vitals:   09/14/15 0656  TempSrc: Oral      Patients Stated Pain Goal: 6 ([id number])  Complications: No apparent anesthesia complications

## 2015-09-14 NOTE — Anesthesia Preprocedure Evaluation (Addendum)
Anesthesia Evaluation  Patient identified by MRN, date of birth, ID band Patient awake    Reviewed: Allergy & Precautions, H&P , NPO status , Patient's Chart, lab work & pertinent test results  History of Anesthesia Complications Negative for: history of anesthetic complications  Airway Mallampati: I       Dental  (+) Teeth Intact   Pulmonary former smoker,    Pulmonary exam normal        Cardiovascular hypertension, Pt. on medications  Rhythm:Regular Rate:Normal     Neuro/Psych  Headaches, PSYCHIATRIC DISORDERS Depression    GI/Hepatic GERD  Medicated and Controlled,  Endo/Other    Renal/GU      Musculoskeletal   Abdominal   Peds  Hematology  (+) anemia ,   Anesthesia Other Findings   Reproductive/Obstetrics                             Anesthesia Physical Anesthesia Plan  ASA: II  Anesthesia Plan: MAC   Post-op Pain Management:    Induction: Intravenous  Airway Management Planned: Nasal Cannula  Additional Equipment:   Intra-op Plan:   Post-operative Plan:   Informed Consent: I have reviewed the patients History and Physical, chart, labs and discussed the procedure including the risks, benefits and alternatives for the proposed anesthesia with the patient or authorized representative who has indicated his/her understanding and acceptance.     Plan Discussed with:   Anesthesia Plan Comments:         Anesthesia Quick Evaluation

## 2015-09-14 NOTE — Op Note (Signed)
[facility] Patient Name: Renee Waters Procedure Date: 09/14/2015 7:55 AM MRN: [id number] Date of Birth: [birth year] Attending MD: Norvel Richards , MD CSN: [id number] Age: 70 Admit Type: Outpatient Procedure:                Ileo-colonoscopy with snare polypectomy- iron                            deficiency anemia, weight loss Indications:              Iron deficiency anemia Providers:                Norvel Richards, MD, Lurline Del, RN, Isabella Stalling, Technician Referring MD:              Medicines:                Propofol per Anesthesia Complications:            No immediate complications. Estimated Blood Loss:     Estimated blood loss was minimal. Procedure:                Pre-Anesthesia Assessment:                           - Prior to the procedure, a History and Physical                            was performed, and patient medications and                            allergies were reviewed. The patient's tolerance of                            previous anesthesia was also reviewed. The risks                            and benefits of the procedure and the sedation                            options and risks were discussed with the patient.                            All questions were answered, and informed consent                            was obtained. Prior Anticoagulants: The patient has                            taken no previous anticoagulant or antiplatelet                            agents. ASA Grade Assessment: II - A patient with  mild systemic disease. After reviewing the risks                            and benefits, the patient was deemed in                            satisfactory condition to undergo the procedure.                           After obtaining informed consent, the colonoscope                            was passed under direct vision. Throughout the                             procedure, the patient's blood pressure, pulse, and                            oxygen saturations were monitored continuously. The                            [id number] scope was introduced through                            the anus and advanced to the 5 cm into the ileum.                            The colonoscopy was performed without difficulty.                            The patient tolerated the procedure well. The                            quality of the bowel preparation was adequate. The                            terminal ileum, ileocecal valve, appendiceal                            orifice, and rectum were photographed. Scope In: 7:59:14 AM Scope Out: [id number] AM Scope Withdrawal Time: 0 hours 9 minutes 20 seconds  Total Procedure Duration: 0 hours 17 minutes 32 seconds  Findings:      Tortuous colon. Melanosis coli present.      The perianal and digital rectal examinations were normal.      A few small-mouthed diverticula were found in the sigmoid colon and       descending colon.      The distal 5 cm of ileum appeared normal. (1) 3 mm polyp in the       ascending segment.      The exam was otherwise without abnormality on direct and retroflexion       views. Impression:               - Diverticulosis in the sigmoid colon and in the  descending colon.                           - one polyp in the mid ascending colon, removed                            with a cold snare. Resected and retrieved.                           - The examined portion of the ileum was normal.                           - The examination was otherwise normal on direct                            and retroflexion views. Moderate Sedation:      Moderate (conscious) sedation was personally administered by an       anesthesia professional. The following parameters were monitored: oxygen       saturation, heart rate, blood pressure, respiratory rate, EKG, adequacy        of pulmonary ventilation, and response to care. Total physician       intraservice time was 30 minutes. Recommendation:           - Patient has a contact number available for                            emergencies. The signs and symptoms of potential                            delayed complications were discussed with the                            patient. Return to normal activities tomorrow.                            Written discharge instructions were provided to the                            patient.                           - Advance diet as tolerated.                           - Continue present medications.                           - Repeat colonoscopy date to be determined after                            pending pathology results are reviewed for                            surveillance based on pathology results.                           -  Return to GI clinic after studies are complete.                            study EGD report Procedure Code(s):        --- Professional ---                           [phone], Colonoscopy, flexible; with removal of                            tumor(s), polyp(s), or other lesion(s) by snare                            technique Diagnosis Code(s):        --- Professional ---                           D12.2, Benign neoplasm of ascending colon                           D50.9, Iron deficiency anemia, unspecified                           K57.30, Diverticulosis of large intestine without                            perforation or abscess without bleeding CPT copyright 2016 American Medical Association. All rights reserved. The codes documented in this report are preliminary and upon coder review may  be revised to meet current compliance requirements. [name]. Kathy Wares, MD Norvel Richards, MD 09/14/2015 8:34:11 AM This report has been signed electronically. Number of Addenda: 0

## 2015-09-14 NOTE — Interval H&P Note (Signed)
History and Physical Interval Note:  09/14/2015 11:06 AM  Renee Waters  has presented today for surgery, with the diagnosis of IDA/WEIGHT LOSS  The various methods of treatment have been discussed with the patient and family. After consideration of risks, benefits and other options for treatment, the patient has consented to  Procedure(s) with comments: COLONOSCOPY WITH PROPOFOL (N/A) - 730 ESOPHAGOGASTRODUODENOSCOPY (EGD) WITH PROPOFOL (N/A) POLYPECTOMY - ascending colon BIOPSY - esophageal and gastric as a surgical intervention .  The patient's history has been reviewed, patient examined, no change in status, stable for surgery.  I have reviewed the patient's chart and labs.  Questions were answered to the patient's satisfaction.     Raja Liska  No change.  The risks, benefits, limitations, imponderables and alternatives regarding both EGD and colonoscopy have been reviewed with the patient. Questions have been answered. All parties agreeable.

## 2015-09-14 NOTE — Anesthesia Postprocedure Evaluation (Signed)
Anesthesia Post Note  Patient: Renee Waters  Procedure(s) Performed: Procedure(s) (LRB): COLONOSCOPY WITH PROPOFOL (N/A) ESOPHAGOGASTRODUODENOSCOPY (EGD) WITH PROPOFOL (N/A) POLYPECTOMY BIOPSY  Patient location during evaluation: PACU Anesthesia Type: MAC Level of consciousness: awake, oriented and patient cooperative Pain management: pain level controlled Vital Signs Assessment: post-procedure vital signs reviewed and stable Respiratory status: spontaneous breathing, nonlabored ventilation and respiratory function stable Cardiovascular status: blood pressure returned to baseline Postop Assessment: no signs of nausea or vomiting Anesthetic complications: no    Last Vitals:  Vitals:   09/14/15 0730 09/14/15 0735  BP: (!) 168/86 (!) 156/92  Resp: 15 15  Temp:      Last Pain:  Vitals:   09/14/15 0656  TempSrc: Oral                 [name]

## 2015-09-14 NOTE — H&P (View-Only) (Signed)
Primary Care Physician: Tula Nakayama, MD  Primary Gastroenterologist:  Garfield Cornea, MD   Chief Complaint  Patient presents with  . Weight Loss  . Abdominal Pain    HPI: Renee Waters is a 70 y.o. female here For follow-up. Last seen in April 2017. History of abdominal pain and weight loss.Last seen EGD for Barrett's esophagus surveillance back in February 2016. No dysplasia on biopsies. 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 Complicated past medical history including hospitalization back in December 2016 with sepsis, acute renal insufficiency, gangrenous gallbladder. She was hospitalized for 3 weeks. She required surgical intervention and management of gangrenous cholecystitis with gallbladder rupture and small bowel obstruction. Dropped 20 pounds with her illness. 110 pounds at time of discharge. When I saw her back in April she was continued to have some right upper quadrant discomfort with meals and with palpation. Ongoing iron deficiency anemia. Given patient was having some slow improvement, we continued to monitor her. We touched base with her torso and of April and she felt like she was getting better in her abdominal pain had improved. She called in May stating she had some further abdominal pain and bright red blood per rectum. CT scan abdomen with contrast was ordered at that time given her complicated history. No significant findings on CT.  In May her weight was 117 pounds, down to 111 pounds today. Weight prior to illness was 126 pounds back in November 2016.  Patient very upset about ongoing weight loss. Tearful throughout visit. Feels like she is taking in sufficient amount of food. Eating out a lot. Dinner usually starch, meat, 2 veggies. Small lunch at home. Breakfast consists up bagel/fruit or eggs and toast. No abdominal pain with meals. Continues to complain of soreness and numbness at her ruq incision. Especially with movement and palpation. BM difficult,  constipation. Tried fiber supplement without relief. Senna works but has to take 4 and she has to take daily. States she does not take pain medication every day for her back.    Megace started couple of months ago.   Current Outpatient Prescriptions  Medication Sig Dispense Refill  . ALPRAZolam (XANAX) 0.5 MG tablet TAKE 1 TABLET BY MOUTH ONCE DAILY 30 tablet 2  . busPIRone (BUSPAR) 7.5 MG tablet TAKE 1 TABLET BY MOUTH 3 TIMES DAILY 90 tablet 2  . citalopram (CELEXA) 20 MG tablet Take 1 tablet (20 mg total) by mouth daily. 30 tablet 5  . diphenoxylate-atropine (LOMOTIL) 2.5-0.025 MG tablet oNE TABLET  UP TO 4  TIMES DAILY AS NEEDED FOR LOOSE STOOL 20 tablet 0  . gabapentin (NEURONTIN) 600 MG tablet Take 600 mg by mouth 2 (two) times daily.    . megestrol (MEGACE) 40 MG tablet Take 1 tablet (40 mg total) by mouth daily. 30 tablet 3  . Multiple Vitamins-Minerals (MULTIVITAMINS THER. W/MINERALS) TABS Take 1 tablet by mouth daily.     Marland Kitchen omeprazole (PRILOSEC) 40 MG capsule TAKE 1 CAPSULE BY MOUTH ONCE DAILY 30 capsule 2  . Oxycodone HCl 10 MG TABS Take 10 mg by mouth. 1 tab every 6 hours as needed    . temazepam (RESTORIL) 30 MG capsule TAKE 1 CAPSULE BY MOUTH ONCE DAILY EVERY NIGHT AT BEDTIME 30 capsule 1  . tiZANidine (ZANAFLEX) 4 MG tablet TAKE 1 TABLET BY MOUTH ONCE DAILY AT BEDTIME 30 tablet 2  . valsartan (DIOVAN) 80 MG tablet Take 1 tablet (80 mg total) by mouth daily. 30 tablet 4  . Vitamin D,  Ergocalciferol, (DRISDOL) 50000 units CAPS capsule TAKE 1 CAPSULE BY MOUTH ONCE A WEEK 4 capsule 3   No current facility-administered medications for this visit.    Allergies as of 08/17/2015 - Review Complete 08/17/2015  Allergen Reaction Noted  . Tylox [oxycodone-acetaminophen] Other (See Comments) 09/27/2010  . Bee venom Swelling 06/12/2012   Past Medical History  Diagnosis Date  . Insomnia   . Chronic back pain   . Chronic neck pain   . Depression   . GERD (gastroesophageal reflux  disease)   . Osteoarthritis   . Genital herpes   . Hypertension   . S/P colonoscopy June 2005    normal, no polyps  . S/P endoscopy June 2005, Oct 2009    2005: short-segment Barrett's, 2009: short-segment Barrett's  . Barrett's esophagus   . UTI (lower urinary tract infection) 10/14    currently on Penicillin-  states is improving  . History of blood transfusion   . Anemia    Past Surgical History  Procedure Laterality Date  . Cervical disectomy  2002  . Shoulder arthroscopy  2008    left  . Right hip replacement  07/2010    went back in sept 2012 to fix  . Hernia repair  07/2010    Dr. Zada Girt  . Abdominal hysterectomy    . Esophagogastroduodenoscopy  11/29/2007    salmon-colored  tongue   longest stable at  3 cm, distal esophagus as described previously status post biopsy/ Hiatal hernia, otherwise normal stomach D1 and D2  . Esophagogastroduodenoscopy  01/06/11    short segment Barrett's esophagus s/p bx/Hiatal hernia  . Colonoscopy  11/09/2011    RMR: Melanosis coli  . Total hip revision Right 12/17/2012    Procedure: RIGHT TOTAL HIP REVISION;  Surgeon: Mauri Pole, MD;  Location: [facility];  Service: Orthopedics;  Laterality: Right;  . Carpal tunnel release Left 2013  . Wrist surgery Right 2011  . Esophagogastroduodenoscopy (egd) with propofol N/A 03/20/2014    [id number] distal esophagus short segment barrett's, bx with no dysplasia. next egd in 03/2017  . Biopsy N/A 03/20/2014    Procedure: BIOPSY;  Surgeon: Daneil Dolin, MD;  Location: [facility];  Service: Endoscopy;  Laterality: N/A;  . Cholecystectomy      with lysis of adhesions for sbo    ROS:  General: Negative for anorexia,  fever, chills, fatigue, weakness. See hpi. ENT: Negative for hoarseness, difficulty swallowing , nasal congestion. CV: Negative for chest pain, angina, palpitations, dyspnea on exertion, peripheral edema.  Respiratory: Negative for dyspnea at rest, dyspnea on exertion, cough, sputum,  wheezing.  GI: See history of present illness. GU:  Negative for dysuria, hematuria, urinary incontinence, urinary frequency, nocturnal urination.  Endo: see hpi   Physical Examination:   BP 154/76 mmHg  Pulse 70  Temp(Src) 99.2 F (37.3 C)  Wt 111 lb 8 oz (50.576 kg)  General: Well-nourished, well-developed in no acute distress. Tearful. Eyes: No icterus. Mouth: Oropharyngeal mucosa moist and pink , no lesions erythema or exudate. Lungs: Clear to auscultation bilaterally.  Heart: Regular rate and rhythm, no murmurs rubs or gallops.  Abdomen: Bowel sounds are normal,  nondistended, no hepatosplenomegaly or masses, no abdominal bruits or hernia , no rebound or guarding.  ruq long incision with some thickness. Mild tenderness with palpation.  Extremities: No lower extremity edema. No clubbing or deformities. Neuro: Alert and oriented x 4   Skin: Warm and dry, no jaundice.   Psych: Alert and cooperative, normal  mood and affect.  Labs:  Reviewed labs from 05/2015. Requested most recent labs.   Imaging Studies: No results found.     No change; TCS with possible EGD to follow per plan. The risks, benefits, limitations, imponderables and alternatives regarding both EGD and colonoscopy have been reviewed with the patient. Questions have been answered. All parties agreeable.

## 2015-09-14 NOTE — Discharge Instructions (Signed)
Colonoscopy Discharge Instructions  Read the instructions outlined below and refer to this sheet in the next few weeks. These discharge instructions provide you with general information on caring for yourself after you leave the hospital. Your doctor may also give you specific instructions. While your treatment has been planned according to the most current medical practices available, unavoidable complications occasionally occur. If you have any problems or questions after discharge, call Dr. Gala Romney at [phone]. ACTIVITY  You may resume your regular activity, but move at a slower pace for the next 24 hours.   Take frequent rest periods for the next 24 hours.   Walking will help get rid of the air and reduce the bloated feeling in your belly (abdomen).   No driving for 24 hours (because of the medicine (anesthesia) used during the test).    Do not sign any important legal documents or operate any machinery for 24 hours (because of the anesthesia used during the test).  NUTRITION  Drink plenty of fluids.   You may resume your normal diet as instructed by your doctor.   Begin with a light meal and progress to your normal diet. Heavy or fried foods are harder to digest and may make you feel sick to your stomach (nauseated).   Avoid alcoholic beverages for 24 hours or as instructed.  MEDICATIONS  You may resume your normal medications unless your doctor tells you otherwise.  WHAT YOU CAN EXPECT TODAY  Some feelings of bloating in the abdomen.   Passage of more gas than usual.   Spotting of blood in your stool or on the toilet paper.  IF YOU HAD POLYPS REMOVED DURING THE COLONOSCOPY:  No aspirin products for 7 days or as instructed.   No alcohol for 7 days or as instructed.   Eat a soft diet for the next 24 hours.  FINDING OUT THE RESULTS OF YOUR TEST Not all test results are available during your visit. If your test results are not back during the visit, make an appointment  with your caregiver to find out the results. Do not assume everything is normal if you have not heard from your caregiver or the medical facility. It is important for you to follow up on all of your test results.  SEEK IMMEDIATE MEDICAL ATTENTION IF:  You have more than a spotting of blood in your stool.   Your belly is swollen (abdominal distention).   You are nauseated or vomiting.   You have a temperature over 101.   You have abdominal pain or discomfort that is severe or gets worse throughout the day.   EGD Discharge instructions Please read the instructions outlined below and refer to this sheet in the next few weeks. These discharge instructions provide you with general information on caring for yourself after you leave the hospital. Your doctor may also give you specific instructions. While your treatment has been planned according to the most current medical practices available, unavoidable complications occasionally occur. If you have any problems or questions after discharge, please call your doctor. ACTIVITY  You may resume your regular activity but move at a slower pace for the next 24 hours.   Take frequent rest periods for the next 24 hours.   Walking will help expel (get rid of) the air and reduce the bloated feeling in your abdomen.   No driving for 24 hours (because of the anesthesia (medicine) used during the test).   You may shower.   Do not sign any  important legal documents or operate any machinery for 24 hours (because of the anesthesia used during the test).  NUTRITION  Drink plenty of fluids.   You may resume your normal diet.   Begin with a light meal and progress to your normal diet.   Avoid alcoholic beverages for 24 hours or as instructed by your caregiver.  MEDICATIONS  You may resume your normal medications unless your caregiver tells you otherwise.  WHAT YOU CAN EXPECT TODAY  You may experience abdominal discomfort such as a feeling of  fullness or gas pains.  FOLLOW-UP  Your doctor will discuss the results of your test with you.  SEEK IMMEDIATE MEDICAL ATTENTION IF ANY OF THE FOLLOWING OCCUR:  Excessive nausea (feeling sick to your stomach) and/or vomiting.   Severe abdominal pain and distention (swelling).   Trouble swallowing.   Temperature over 101 F (37.8 C).   Rectal bleeding or vomiting of blood.   Continue omeprazole daily  Further recommendations to follow pending review of pathology report  Continued Linzess daily  Office visit with [place] in 6 weeks

## 2015-09-14 NOTE — Op Note (Signed)
[facility] Patient Name: Renee Waters Procedure Date: 09/14/2015 7:38 AM MRN: [id number] Date of Birth: [birth year] Attending MD: Norvel Richards , MD CSN: [id number] Age: 70 Admit Type: Outpatient Procedure:                Upper GI endoscopy with esophageal and gastric                            biopsy Indications:              Iron deficiency anemia, Weight loss Providers:                Norvel Richards, MD, Lurline Del, RN, Isabella Stalling, Technician Referring MD:             Norwood Levo. Simpson MD, MD Medicines:                Propofol per Anesthesia Complications:            No immediate complications. Estimated Blood Loss:     Estimated blood loss was minimal. Procedure:                Pre-Anesthesia Assessment:                           - Prior to the procedure, a History and Physical                            was performed, and patient medications and                            allergies were reviewed. The patient's tolerance of                            previous anesthesia was also reviewed. The risks                            and benefits of the procedure and the sedation                            options and risks were discussed with the patient.                            All questions were answered, and informed consent                            was obtained. Prior Anticoagulants: The patient has                            taken no previous anticoagulant or antiplatelet                            agents. ASA Grade Assessment: II - A patient with  mild systemic disease. After reviewing the risks                            and benefits, the patient was deemed in                            satisfactory condition to undergo the procedure.                           After obtaining informed consent, the endoscope was                            passed under direct vision. Throughout the           procedure, the patient's blood pressure, pulse, and                            oxygen saturations were monitored continuously. The                            [id number]) scope was introduced through the                            mouth, and advanced to the second part of duodenum.                            The [id number]) scope was introduced through                            the and advanced to the. The upper GI endoscopy was                            accomplished without difficulty. The patient                            tolerated the procedure well. Scope In: 8:23:39 AM Scope Out: 8:29:58 AM Total Procedure Duration: 0 hours 6 minutes 19 seconds  Findings:      There were esophageal mucosal changes consistent with Barrett's       esophagus present in the distal esophagus. The maximum longitudinal       extent of these mucosal changes was 2 cm in length. Mucosa was biopsied       with a cold forceps for histology. One specimen bottle was sent to       pathology. No nodularity. No esophagitis. Stomach empty. Small hiatal       hernia.      Mild inflammation characterized by erythema and granularity was found in       the entire examined stomach. Biopsies were taken with a cold forceps for       histology. Estimated blood loss was minimal.      The second portion of the duodenum was normal. Impression:               - Esophageal mucosal changes consistent with  Barrett's esophagus. Biopsied.                           - Gastritis. Biopsied. hiatal hernia.                           - Normal second portion of the duodenum. Moderate Sedation:      Moderate (conscious) sedation was personally administered by an       anesthesia professional. The following parameters were monitored: oxygen       saturation, heart rate, blood pressure, respiratory rate, EKG, adequacy       of pulmonary ventilation, and response to care. Total physician        intraservice time was 45 minutes. Recommendation:           - Patient has a contact number available for                            emergencies. The signs and symptoms of potential                            delayed complications were discussed with the                            patient. Return to normal activities tomorrow.                            Written discharge instructions were provided to the                            patient.                           - Advance diet as tolerated.                           - Continue present medications.                           - Repeat upper endoscopy after studies are complete                            for surveillance based on pathology results.                           - Return to GI clinic in 3 months. Procedure Code(s):        --- Professional ---                           [phone], Esophagogastroduodenoscopy, flexible,                            transoral; with biopsy, single or multiple Diagnosis Code(s):        --- Professional ---                           K22.8, Other specified diseases of esophagus  K29.70, Gastritis, unspecified, without bleeding                           D50.9, Iron deficiency anemia, unspecified                           R63.4, Abnormal weight loss CPT copyright 2016 American Medical Association. All rights reserved. The codes documented in this report are preliminary and upon coder review may  be revised to meet current compliance requirements. [name]. Ilianna Bown, MD Norvel Richards, MD 09/14/2015 8:39:46 AM This report has been signed electronically. Number of Addenda: 0

## 2015-09-14 NOTE — Interval H&P Note (Signed)
History and Physical Interval Note:  09/14/2015 10:42 AM  Renee Waters  has presented today for surgery, with the diagnosis of * No surgery found *  The various methods of treatment have been discussed with the patient and family. After consideration of risks, benefits and other options for treatment, the patient has consented to  * No surgery found * as a surgical intervention .  The patient's history has been reviewed, patient examined, no change in status, stable for surgery.  I have reviewed the patient's chart and labs.  Questions were answered to the patient's satisfaction.     Renee Waters  No change.  The risks, benefits, limitations, imponderables and alternatives regarding both EGD and colonoscopy have been reviewed with the patient. Questions have been answered. All parties agreeable.

## 2015-09-16 ENCOUNTER — Encounter: Payer: Self-pay | Admitting: Internal Medicine

## 2015-09-16 NOTE — Progress Notes (Signed)
p 

## 2015-09-17 ENCOUNTER — Telehealth: Payer: Self-pay | Admitting: Family Medicine

## 2015-09-17 ENCOUNTER — Encounter (HOSPITAL_COMMUNITY): Payer: Self-pay | Admitting: Internal Medicine

## 2015-09-17 NOTE — Telephone Encounter (Signed)
States she still hasn't heard if the pain management Dr at [facility] is going to accept her. If not, she needs to go elsewhere. She is getting frustrated. Please advise

## 2015-09-17 NOTE — Telephone Encounter (Signed)
Renee Waters Is asking to speak to the nurse in regards to her referral to the pain clinic at [facility]

## 2015-09-23 ENCOUNTER — Other Ambulatory Visit: Payer: Self-pay | Admitting: Family Medicine

## 2015-09-27 ENCOUNTER — Other Ambulatory Visit: Payer: Self-pay | Admitting: Family Medicine

## 2015-09-28 ENCOUNTER — Telehealth: Payer: Self-pay | Admitting: Anesthesiology

## 2015-09-28 NOTE — Telephone Encounter (Signed)
Ive tried numerous times to get someone on the phone at [facility] with no success, I withdrew that referral and sent a new referral in to [facility]/Patient is aware and agrees

## 2015-09-28 NOTE — Telephone Encounter (Signed)
Patient called about referral, stating she is out of meds on 10-02-15. What is status of her referral ?

## 2015-10-05 ENCOUNTER — Encounter: Payer: Self-pay | Admitting: Family Medicine

## 2015-10-05 ENCOUNTER — Other Ambulatory Visit: Payer: Self-pay

## 2015-10-05 ENCOUNTER — Encounter (INDEPENDENT_AMBULATORY_CARE_PROVIDER_SITE_OTHER): Payer: Self-pay

## 2015-10-05 ENCOUNTER — Telehealth: Payer: Self-pay | Admitting: Family Medicine

## 2015-10-05 ENCOUNTER — Ambulatory Visit (INDEPENDENT_AMBULATORY_CARE_PROVIDER_SITE_OTHER): Payer: Medicare Other | Admitting: Family Medicine

## 2015-10-05 VITALS — BP 142/80 | HR 67 | Resp 16 | Ht 65.0 in | Wt 120.8 lb

## 2015-10-05 DIAGNOSIS — F418 Other specified anxiety disorders: Secondary | ICD-10-CM | POA: Diagnosis not present

## 2015-10-05 DIAGNOSIS — Z23 Encounter for immunization: Secondary | ICD-10-CM | POA: Diagnosis not present

## 2015-10-05 DIAGNOSIS — M5440 Lumbago with sciatica, unspecified side: Secondary | ICD-10-CM

## 2015-10-05 DIAGNOSIS — I10 Essential (primary) hypertension: Secondary | ICD-10-CM

## 2015-10-05 DIAGNOSIS — D509 Iron deficiency anemia, unspecified: Secondary | ICD-10-CM

## 2015-10-05 MED ORDER — OXYCODONE HCL 10 MG PO TABS: 10.0000 mg | ORAL_TABLET | Freq: Four times a day (QID) | ORAL | 0 refills | Status: DC

## 2015-10-05 NOTE — Telephone Encounter (Signed)
Dr. Moshe Cipro, Renee Waters is working on getting scheduled with [facility], Her Records are in review but they are waiting to get previous records from Dr. Francesco Runner to review which is taking some time. Noreen is asking if you could treat her pain until she can see Someone at [facility] she is out of medicaiton, please advise?

## 2015-10-05 NOTE — Patient Instructions (Signed)
F/u in October as before, call if you need me sooner  I will directly contact pain Doc assigned to you to attempt to get an appointment commitment  Until you are established, you will receive pain medication from this office at current diose, and will sign paain contract today  Flu vaccine toda  Stay away form salty and canned foods to help with blood pressure please (slightly elevated)

## 2015-10-05 NOTE — Assessment & Plan Note (Signed)
Controlled, no change in medication  

## 2015-10-05 NOTE — Progress Notes (Signed)
   Renee Waters     MRN: [id number]      DOB: Mar 07, 1945   HPI [name] is here for follow up and re-evaluation of chronic medical conditions, medication management and review of any available recent lab and radiology data.  Preventive health is updated, specifically  Cancer screening and Immunization.   Needs help with pain management as she is between Providers, out of medication now, and awaiting established Review of her med database is pristine, and she has some of her records from previous provider to help with transition of care. Narc contract established temporarily here and direct contact via note sent to Pain Doc she has been referred to asking for help as soon as possible   C/oROS Denies recent fever or chills. Denies sinus pressure, nasal congestion, ear pain or sore throat. Denies chest congestion, productive cough or wheezing. Denies chest pains, palpitations and leg swelling Denies abdominal pain, nausea, vomiting,diarrhea or constipation.   Denies dysuria, frequency, hesitancy or incontinence. Denies headaches, seizures, numbness, or tingling. Denies depression, anxiety or insomnia. Denies skin break down or rash.   PE  BP (!) 142/80   Pulse 67   Resp 16   Ht 5\' 5"  (1.651 m)   Wt 120 lb 12.8 oz (54.8 kg)   SpO2 100%   BMI 20.10 kg/m   Patient alert and oriented and in no cardiopulmonary distress.  HEENT: No facial asymmetry, EOMI,   oropharynx pink and moist.  Neck supple no JVD, no mass.  Chest: Clear to auscultation bilaterally.  CVS: S1, S2 no murmurs, no S3.Regular rate.  ABD: Soft non tender.   Ext: No edema  MS: Adequate though reduced  ROM spine, shoulders, hips and knees.  Skin: Intact, no ulcerations or rash noted.  Psych: Good eye contact, normal affect. Memory intact not anxious or depressed appearing.  CNS: CN 2-12 intact, power,  normal throughout.no focal deficits noted.   Assessment & Plan  Backache Chronic pain management  needs to be reinstated. Was being treated by dr Francesco Runner, still awaiting appt with new Specialist, visit has been authorized Database checked, pt is compliant with pai meds as prescribed. Pain contract today and I will prescribe for her at current dose until she has established with pain clinic  Need for prophylactic vaccination and inoculation against influenza After obtaining informed consent, the vaccine is  administered by LPN.   Essential hypertension Sub optimal control, no change in management DASH diet and commitment to daily physical activity for a minimum of 30 minutes discussed and encouraged, as a part of hypertension management. The importance of attaining a healthy weight is also discussed.  BP/Weight 10/05/2015 09/14/2015 09/09/2015 08/17/2015 07/02/2015 [id number]  Systolic BP A999333 [id number]  Diastolic BP 80 91 76 76 78 84 80  Wt. (Lbs) 120.8 - 119 111.5 117.8 117 117  BMI 20.1 - 19.8 18.55 19.6 19.47 19.47       Depression with anxiety Controlled, no change in medication

## 2015-10-05 NOTE — Assessment & Plan Note (Signed)
After obtaining informed consent, the vaccine is  administered by LPN.  

## 2015-10-05 NOTE — Telephone Encounter (Signed)
noted 

## 2015-10-05 NOTE — Assessment & Plan Note (Signed)
Sub optimal control, no change in management DASH diet and commitment to daily physical activity for a minimum of 30 minutes discussed and encouraged, as a part of hypertension management. The importance of attaining a healthy weight is also discussed.  BP/Weight 10/05/2015 09/14/2015 09/09/2015 08/17/2015 07/02/2015 [id number]  Systolic BP A999333 [id number] 123456 [id number] 123456 123456  Diastolic BP 80 91 76 76 78 84 80  Wt. (Lbs) 120.8 - 119 111.5 117.8 117 117  BMI 20.1 - 19.8 18.55 19.6 19.47 19.47

## 2015-10-05 NOTE — Telephone Encounter (Signed)
Renee Waters will be mailing medical records to [place]

## 2015-10-05 NOTE — Telephone Encounter (Signed)
Work in today with me , this morning preferred pls, if possible

## 2015-10-05 NOTE — Telephone Encounter (Signed)
Coming in this afternoon at 2:00, she is on her way to [facility] to [place] to sign releases to have Dr. Delbert Phenix records released

## 2015-10-05 NOTE — Assessment & Plan Note (Signed)
Chronic pain management needs to be reinstated. Was being treated by dr Francesco Runner, still awaiting appt with new Specialist, visit has been authorized Database checked, pt is compliant with pai meds as prescribed. Pain contract today and I will prescribe for her at current dose until she has established with pain clinic

## 2015-10-13 ENCOUNTER — Other Ambulatory Visit: Payer: Self-pay | Admitting: Gastroenterology

## 2015-10-13 DIAGNOSIS — D509 Iron deficiency anemia, unspecified: Secondary | ICD-10-CM | POA: Diagnosis not present

## 2015-10-14 LAB — CBC WITH DIFFERENTIAL/PLATELET
BASOS: 1 %
Basophils Absolute: 0 10*3/uL (ref 0.0–0.2)
EOS (ABSOLUTE): 0.1 10*3/uL (ref 0.0–0.4)
EOS: 2 %
HEMATOCRIT: 35.2 % (ref 34.0–46.6)
Hemoglobin: 11.3 g/dL (ref 11.1–15.9)
IMMATURE GRANS (ABS): 0 10*3/uL (ref 0.0–0.1)
IMMATURE GRANULOCYTES: 0 %
Lymphocytes Absolute: 1.5 10*3/uL (ref 0.7–3.1)
Lymphs: 26 %
MCH: 30.1 pg (ref 26.6–33.0)
MCHC: 32.1 g/dL (ref 31.5–35.7)
MCV: 94 fL (ref 79–97)
MONOS ABS: 0.4 10*3/uL (ref 0.1–0.9)
Monocytes: 7 %
NEUTROS ABS: 3.8 10*3/uL (ref 1.4–7.0)
NEUTROS PCT: 64 %
Platelets: 196 10*3/uL (ref 150–379)
RBC: 3.76 x10E6/uL — ABNORMAL LOW (ref 3.77–5.28)
RDW: 15.9 % — AB (ref 12.3–15.4)
WBC: 5.9 10*3/uL (ref 3.4–10.8)

## 2015-10-15 ENCOUNTER — Telehealth: Payer: Self-pay | Admitting: Family Medicine

## 2015-10-15 ENCOUNTER — Telehealth: Payer: Self-pay

## 2015-10-15 NOTE — Telephone Encounter (Signed)
error 

## 2015-10-15 NOTE — Telephone Encounter (Signed)
[facility] results are back and in LSL box.

## 2015-10-16 NOTE — Telephone Encounter (Signed)
noted 

## 2015-10-19 NOTE — Progress Notes (Signed)
Please let patient know her wbc is now normal.  Please have her keep her OV with me next week as scheduled.

## 2015-10-22 ENCOUNTER — Other Ambulatory Visit: Payer: Self-pay | Admitting: Family Medicine

## 2015-10-23 ENCOUNTER — Other Ambulatory Visit: Payer: Self-pay

## 2015-10-23 MED ORDER — OXYCODONE HCL 10 MG PO TABS: 10.0000 mg | ORAL_TABLET | Freq: Four times a day (QID) | ORAL | 0 refills | Status: DC

## 2015-10-26 ENCOUNTER — Ambulatory Visit (INDEPENDENT_AMBULATORY_CARE_PROVIDER_SITE_OTHER): Payer: Medicare Other | Admitting: Gastroenterology

## 2015-10-26 ENCOUNTER — Encounter: Payer: Self-pay | Admitting: Gastroenterology

## 2015-10-26 VITALS — BP 112/68 | HR 75 | Temp 98.4°F | Ht 65.0 in | Wt 121.6 lb

## 2015-10-26 DIAGNOSIS — R634 Abnormal weight loss: Secondary | ICD-10-CM

## 2015-10-26 DIAGNOSIS — K5900 Constipation, unspecified: Secondary | ICD-10-CM

## 2015-10-26 DIAGNOSIS — K2970 Gastritis, unspecified, without bleeding: Secondary | ICD-10-CM

## 2015-10-26 DIAGNOSIS — R1011 Right upper quadrant pain: Secondary | ICD-10-CM

## 2015-10-26 DIAGNOSIS — K229 Disease of esophagus, unspecified: Secondary | ICD-10-CM

## 2015-10-26 DIAGNOSIS — K2289 Other specified disease of esophagus: Secondary | ICD-10-CM

## 2015-10-26 DIAGNOSIS — K2270 Barrett's esophagus without dysplasia: Secondary | ICD-10-CM

## 2015-10-26 DIAGNOSIS — K2990 Gastroduodenitis, unspecified, without bleeding: Secondary | ICD-10-CM

## 2015-10-26 MED ORDER — PANTOPRAZOLE SODIUM 40 MG PO TBEC: 40.0000 mg | DELAYED_RELEASE_TABLET | Freq: Every day | ORAL | 11 refills | Status: DC

## 2015-10-26 NOTE — Assessment & Plan Note (Signed)
Surveillance EGD August 2020.

## 2015-10-26 NOTE — Assessment & Plan Note (Signed)
Has gained since last office visit. Closer to baseline. Reassuring.

## 2015-10-26 NOTE — Assessment & Plan Note (Signed)
I don't suspect a right upper quadrant abdominal discomfort is related to her gastritis. She has some numbness/tingling at the site of the incision which is likely related to nerve damage during surgery. She has additional pain just below the incision closer to the midline especially with palpation, unrelated to meals. Area of induration. Nothing noted on CT. I will review CT findings with radiologist, pain close attention to this area. If CT is unremarkable, would consider having the patient follow-up with her surgeon as a next step. Patient in agreement with plan.

## 2015-10-26 NOTE — Assessment & Plan Note (Signed)
Continue current regimen, doing well.

## 2015-10-26 NOTE — Progress Notes (Signed)
cc'ed to pcp °

## 2015-10-26 NOTE — Progress Notes (Signed)
Primary Care Physician: Tula Nakayama, MD  Primary Gastroenterologist:  Garfield Cornea, MD   Chief Complaint  Patient presents with  . Follow-up    HPI: Renee Waters is a 70 y.o. female here For follow-up. Last seen him of EGD and colonoscopy in August. She has a history of IDA, weight loss, Barrett's esophagus., Gated past medical history including hospitalization in December 2016 with sepsis, acute renal insufficiency, gangrenous gallbladder. Hospitalized for 3 weeks. Required surgical intervention and management of gangrenous cholecystitis with gallbladder rupture and small bowel obstruction. She has had issues with weight loss since that time. Initially dropped 20 pounds with her illness, was able to gain some weight back but then dropped back down subsequently. She has had ongoing right upper quadrant discomfort particularly with palpation. Described almost a numbness at her incision. She had  CT scan of the abdomen with contrast in May with no significant findings.  On EGD in August she had changes consistent with Barrett's esophagus, no dysplasia on biopsy. Gastritis, biopsies benign, hiatal hernia. Ileocolonoscopy with melanosis coli, diverticulosis, 3 mm tubular adenoma removed. She will need follow-up EGD in 3 years for Barrett's, colonoscopy in 5 years for tubular adenoma.  Since her last office visit, she has gained weight. Closer to her baseline weight prior to her illness. She is pleased with this. She is still concerned about ongoing right upper quadrant pain. She describes a tingling sensation along the incision but has pain below this, with induration underneath the cholecystectomy incision towards the midline. She has tenderness with palpation in this area. He denies worsening symptoms with meals. Possibly some improvement after a bowel movement. She gets concerned that she may develop an obstruction again. She works to have a bowel movement every day. Currently using  prunes, senna S once per week. Linzess didn't work. No melena or rectal bleeding. Rare heartburn on PPI. No vomiting. Appetite is improved. Notes it takes her a long time to eat because of poor dentition.    Current Outpatient Prescriptions  Medication Sig Dispense Refill  . acyclovir (ZOVIRAX) 800 MG tablet TAKE ONE TABLET BY MOUTH 4 TIMES DAILY AS NEEDED FOR  BREAKOUTS 30 tablet 1  . ALPRAZolam (XANAX) 0.5 MG tablet TAKE 1 TABLET BY MOUTH ONCE DAILY 30 tablet 1  . busPIRone (BUSPAR) 7.5 MG tablet Take 7.5 mg by mouth 3 (three) times daily.    . citalopram (CELEXA) 20 MG tablet Take 1 tablet (20 mg total) by mouth daily. 30 tablet 5  . gabapentin (NEURONTIN) 600 MG tablet TAKE 1 TABLET BY MOUTH 3 TIMES DAILY 90 tablet 2  . megestrol (MEGACE) 40 MG tablet Take 1 tablet (40 mg total) by mouth daily. 30 tablet 3  . Multiple Vitamins-Minerals (MULTIVITAMINS THER. W/MINERALS) TABS Take 1 tablet by mouth daily.     Marland Kitchen omeprazole (PRILOSEC) 40 MG capsule TAKE 1 CAPSULE BY MOUTH ONCE DAILY 30 capsule 1  . Oxycodone HCl 10 MG TABS Take 1 tablet (10 mg total) by mouth 4 (four) times daily. 1 tab every 6 hours as needed 120 tablet 0  . senna (SENOKOT) 8.6 MG TABS tablet Take 2 tablets by mouth daily as needed for mild constipation.    . temazepam (RESTORIL) 30 MG capsule TAKE 1 CAPSULE BY MOUTH ONCE DAILY EVERY NIGHT AT BEDTIME 30 capsule 0  . tiZANidine (ZANAFLEX) 4 MG tablet TAKE 1 TABLET BY MOUTH ONCE DAILY AT BEDTIME 30 tablet 2  . topiramate (TOPAMAX) 50 MG tablet TAKE  ONE TABLET BY MOUTH TWICE DAILY 60 tablet 3  . valsartan (DIOVAN) 80 MG tablet Take 1 tablet (80 mg total) by mouth daily. 30 tablet 4  . Vitamin D, Ergocalciferol, (DRISDOL) 50000 units CAPS capsule TAKE 1 CAPSULE BY MOUTH ONCE A WEEK 4 capsule 3   No current facility-administered medications for this visit.     Allergies as of 10/26/2015 - Review Complete 10/26/2015  Allergen Reaction Noted  . Tylox [oxycodone-acetaminophen]  Other (See Comments) 09/27/2010  . Bee venom Swelling 06/12/2012   Past Medical History:  Diagnosis Date  . Anemia   . Barrett's esophagus   . Chronic back pain   . Chronic neck pain   . Depression   . Genital herpes   . GERD (gastroesophageal reflux disease)   . History of blood transfusion   . Hypertension   . Insomnia   . Osteoarthritis   . S/P colonoscopy June 2005   normal, no polyps  . S/P endoscopy June 2005, Oct 2009   2005: short-segment Barrett's, 2009: short-segment Barrett's  . UTI (lower urinary tract infection) 10/14   currently on Penicillin-  states is improving   Past Surgical History:  Procedure Laterality Date  . ABDOMINAL HYSTERECTOMY    . BIOPSY N/A 03/20/2014   Procedure: BIOPSY;  Surgeon: Daneil Dolin, MD;  Location: [facility];  Service: Endoscopy;  Laterality: N/A;  . BIOPSY  09/14/2015   Procedure: BIOPSY;  Surgeon: Daneil Dolin, MD;  Location: [facility];  Service: Endoscopy;;  esophageal and gastric  . CARPAL TUNNEL RELEASE Left 2013  . cervical disectomy  2002  . CHOLECYSTECTOMY     with lysis of adhesions for sbo; "ruptured gallbladder".  . COLONOSCOPY  11/09/2011   RMR: Melanosis coli  . COLONOSCOPY WITH PROPOFOL N/A 09/14/2015   Procedure: COLONOSCOPY WITH PROPOFOL;  Surgeon: Daneil Dolin, MD;  Location: [facility];  Service: Endoscopy;  Laterality: N/A;  730  . ESOPHAGOGASTRODUODENOSCOPY  11/29/2007   salmon-colored  tongue   longest stable at  3 cm, distal esophagus as described previously status post biopsy/ Hiatal hernia, otherwise normal stomach D1 and D2  . ESOPHAGOGASTRODUODENOSCOPY  01/06/11   short segment Barrett's esophagus s/p bx/Hiatal hernia  . ESOPHAGOGASTRODUODENOSCOPY (EGD) WITH PROPOFOL N/A 03/20/2014   [id number] distal esophagus short segment barrett's, bx with no dysplasia. next egd in 03/2017  . ESOPHAGOGASTRODUODENOSCOPY (EGD) WITH PROPOFOL N/A 09/14/2015   Procedure: ESOPHAGOGASTRODUODENOSCOPY (EGD) WITH PROPOFOL;   Surgeon: Daneil Dolin, MD;  Location: [facility];  Service: Endoscopy;  Laterality: N/A;  . HERNIA REPAIR Right 07/2010   Dr. Zada Girt  . POLYPECTOMY  09/14/2015   Procedure: POLYPECTOMY;  Surgeon: Daneil Dolin, MD;  Location: [facility];  Service: Endoscopy;;  ascending colon  . right hip replacement  07/2010   went back in sept 2012 to fix  . SHOULDER ARTHROSCOPY  2008   left  . TOTAL HIP REVISION Right 12/17/2012   Procedure: RIGHT TOTAL HIP REVISION;  Surgeon: Mauri Pole, MD;  Location: [facility];  Service: Orthopedics;  Laterality: Right;  . WRIST SURGERY Right 2011   open reduction right wrist.    ROS:  General: Negative for anorexia, weight loss, fever, chills, fatigue, weakness. ENT: Negative for hoarseness, difficulty swallowing , nasal congestion. CV: Negative for chest pain, angina, palpitations, dyspnea on exertion, peripheral edema.  Respiratory: Negative for dyspnea at rest, dyspnea on exertion, cough, sputum, wheezing.  GI: See history of present illness. GU:  Negative for dysuria, hematuria, urinary incontinence, urinary frequency, nocturnal urination.  Endo: Negative for unusual weight change.    Physical Examination:   BP 112/68   Pulse 75   Temp 98.4 F (36.9 C) (Oral)   Ht 5\' 5"  (1.651 m)   Wt 121 lb 9.6 oz (55.2 kg)   BMI 20.24 kg/m   General: Well-nourished, well-developed in no acute distress.  Eyes: No icterus. Mouth: Oropharyngeal mucosa moist and pink , no lesions erythema or exudate. Lungs: Clear to auscultation bilaterally.  Heart: Regular rate and rhythm, no murmurs rubs or gallops.  Abdomen: Bowel sounds are normal, Well-healed right upper quadrant incision with mild tenderness with palpation over the incision. Reports the midline underneath the incision there is some induration and fullness which is tender to palpation. No herniation appreciated., nondistended, no hepatosplenomegaly or masses, no abdominal bruits or hernia , no rebound  or guarding.   Extremities: No lower extremity edema. No clubbing or deformities. Neuro: Alert and oriented x 4   Skin: Warm and dry, no jaundice.   Psych: Alert and cooperative, normal mood and affect.  Labs:  Lab Results  Component Value Date   WBC 5.9 10/13/2015   HGB 11.3 10/13/2015   HCT 35.2 10/13/2015   MCV 94 10/13/2015   PLT 196 10/13/2015   Lab Results  Component Value Date   TSH 1.76 08/25/2015   Lab Results  Component Value Date   FERRITIN 26 08/25/2015   Lab Results  Component Value Date   IRON 27 (L) 08/25/2015   TIBC 329 08/25/2015   FERRITIN 26 08/25/2015   Lab Results  Component Value Date   CREATININE 0.88 09/09/2015   BUN 18 09/09/2015   NA 134 (L) 09/09/2015   K 3.9 09/09/2015   CL 111 09/09/2015   CO2 21 (L) 09/09/2015    Imaging Studies: No results found.

## 2015-10-26 NOTE — Patient Instructions (Signed)
1. Stop omeprazole. Start pantoprazole 40 mg daily. Rx sent to pharmacy. 2. I will review your CT scan with the radiologist. Further recommendations to follow

## 2015-10-26 NOTE — Assessment & Plan Note (Signed)
No H.pylori. Switch PPIs to see if helps with breakthrough heartburn and ruq pain. Stop omeprazole and start pantoprazole.

## 2015-10-27 ENCOUNTER — Telehealth: Payer: Self-pay

## 2015-10-27 MED ORDER — FLUCONAZOLE 150 MG PO TABS: 150.0000 mg | ORAL_TABLET | Freq: Once | ORAL | 0 refills | Status: AC

## 2015-10-27 NOTE — Telephone Encounter (Signed)
May have trial of fluconazole 150 mg # 1 only, if symptom persists of burning with no d/c or itch, best she see gyne , has seen Dr Barrie Dunker in the past  pls let me know if any concern with the plan, pls send the fluconazole

## 2015-10-27 NOTE — Addendum Note (Signed)
Addended by: Denman George B on: 10/27/2015 02:54 PM   Modules accepted: Orders

## 2015-10-27 NOTE — Telephone Encounter (Signed)
Patient aware and medication sent to requested pharmacy.

## 2015-11-08 HISTORY — PX: DENTAL SURGERY: SHX609

## 2015-11-09 ENCOUNTER — Ambulatory Visit: Payer: Medicare Other | Admitting: Family Medicine

## 2015-11-12 ENCOUNTER — Other Ambulatory Visit: Payer: Self-pay

## 2015-11-12 MED ORDER — TIZANIDINE HCL 4 MG PO TABS: 4.0000 mg | ORAL_TABLET | Freq: Every day | ORAL | 4 refills | Status: DC

## 2015-11-27 ENCOUNTER — Other Ambulatory Visit: Payer: Self-pay

## 2015-11-27 MED ORDER — OXYCODONE HCL 10 MG PO TABS: 10.0000 mg | ORAL_TABLET | Freq: Four times a day (QID) | ORAL | 0 refills | Status: DC

## 2015-11-30 ENCOUNTER — Ambulatory Visit: Payer: Medicare Other | Admitting: Family Medicine

## 2015-12-01 ENCOUNTER — Other Ambulatory Visit: Payer: Self-pay | Admitting: Family Medicine

## 2015-12-01 DIAGNOSIS — I10 Essential (primary) hypertension: Secondary | ICD-10-CM

## 2015-12-14 ENCOUNTER — Other Ambulatory Visit: Payer: Self-pay | Admitting: Family Medicine

## 2015-12-18 ENCOUNTER — Other Ambulatory Visit: Payer: Self-pay | Admitting: Family Medicine

## 2015-12-25 ENCOUNTER — Other Ambulatory Visit: Payer: Self-pay

## 2015-12-25 MED ORDER — OXYCODONE HCL 10 MG PO TABS: 10.0000 mg | ORAL_TABLET | Freq: Four times a day (QID) | ORAL | 0 refills | Status: DC

## 2016-01-05 ENCOUNTER — Ambulatory Visit (HOSPITAL_COMMUNITY)
Admission: RE | Admit: 2016-01-05 | Discharge: 2016-01-05 | Disposition: A | Discharge status: Home / Self Care | Payer: Medicare Other | Source: Ambulatory Visit | Attending: Family Medicine | Admitting: Family Medicine

## 2016-01-05 ENCOUNTER — Ambulatory Visit (INDEPENDENT_AMBULATORY_CARE_PROVIDER_SITE_OTHER): Payer: Medicare Other | Admitting: Family Medicine

## 2016-01-05 ENCOUNTER — Encounter: Payer: Self-pay | Admitting: Family Medicine

## 2016-01-05 VITALS — BP 114/72 | HR 90 | Resp 16 | Ht 65.0 in | Wt 121.8 lb

## 2016-01-05 DIAGNOSIS — M25562 Pain in left knee: Secondary | ICD-10-CM | POA: Diagnosis not present

## 2016-01-05 DIAGNOSIS — M150 Primary generalized (osteo)arthritis: Secondary | ICD-10-CM

## 2016-01-05 DIAGNOSIS — G8929 Other chronic pain: Secondary | ICD-10-CM

## 2016-01-05 DIAGNOSIS — F418 Other specified anxiety disorders: Secondary | ICD-10-CM

## 2016-01-05 DIAGNOSIS — M25462 Effusion, left knee: Secondary | ICD-10-CM | POA: Insufficient documentation

## 2016-01-05 DIAGNOSIS — M170 Bilateral primary osteoarthritis of knee: Secondary | ICD-10-CM | POA: Diagnosis not present

## 2016-01-05 DIAGNOSIS — M25561 Pain in right knee: Secondary | ICD-10-CM

## 2016-01-05 DIAGNOSIS — D509 Iron deficiency anemia, unspecified: Secondary | ICD-10-CM

## 2016-01-05 DIAGNOSIS — K2100 Gastro-esophageal reflux disease with esophagitis, without bleeding: Secondary | ICD-10-CM

## 2016-01-05 DIAGNOSIS — Z1211 Encounter for screening for malignant neoplasm of colon: Secondary | ICD-10-CM

## 2016-01-05 DIAGNOSIS — Z1231 Encounter for screening mammogram for malignant neoplasm of breast: Secondary | ICD-10-CM | POA: Diagnosis not present

## 2016-01-05 DIAGNOSIS — M159 Polyosteoarthritis, unspecified: Secondary | ICD-10-CM

## 2016-01-05 DIAGNOSIS — I10 Essential (primary) hypertension: Secondary | ICD-10-CM | POA: Diagnosis not present

## 2016-01-05 DIAGNOSIS — E559 Vitamin D deficiency, unspecified: Secondary | ICD-10-CM

## 2016-01-05 DIAGNOSIS — F5105 Insomnia due to other mental disorder: Secondary | ICD-10-CM

## 2016-01-05 DIAGNOSIS — F411 Generalized anxiety disorder: Secondary | ICD-10-CM

## 2016-01-05 LAB — POC HEMOCCULT BLD/STL (OFFICE/1-CARD/DIAGNOSTIC): FECAL OCCULT BLD: NEGATIVE

## 2016-01-05 MED ORDER — PANTOPRAZOLE SODIUM 40 MG PO TBEC: 40.0000 mg | DELAYED_RELEASE_TABLET | Freq: Every day | ORAL | 11 refills | Status: DC

## 2016-01-05 MED ORDER — GABAPENTIN 300 MG PO CAPS: ORAL_CAPSULE | ORAL | 3 refills | Status: DC

## 2016-01-05 NOTE — Patient Instructions (Signed)
Wellness visit in December, call if you  Need me before  Pelvic and breast exam in 4 months  NEED MAMMOGRAM past due  Fasting labs for next visit  HAPPY 70 !!!!  X ray of both knees today   Rectal today.  Med management as discussed, take topamax twice daily to prevent / reduce headache frequency  Gabapentin reduced to 300 mg one daily  Protonix/Pantoprazole to be sent in place of omeprazole  Excellent BP  All the best with Pain clinic you need this

## 2016-01-06 ENCOUNTER — Other Ambulatory Visit: Payer: Self-pay | Admitting: Family Medicine

## 2016-01-06 ENCOUNTER — Telehealth: Payer: Self-pay

## 2016-01-06 DIAGNOSIS — M170 Bilateral primary osteoarthritis of knee: Secondary | ICD-10-CM

## 2016-01-06 DIAGNOSIS — R937 Abnormal findings on diagnostic imaging of other parts of musculoskeletal system: Secondary | ICD-10-CM

## 2016-01-06 NOTE — Telephone Encounter (Signed)
-----   Message from [name], MD sent at 01/06/2016 12:50 PM EST ----- pls advise knees show mild arthritis, however,I do recommend orhto eval as in riht knee may have bony overgrowth, specialist will review and order any additional tests etc, I have referred to local orhto, if she wishes to see someone else will need to directly speak with leigh ann

## 2016-01-11 ENCOUNTER — Telehealth: Payer: Self-pay

## 2016-01-11 MED ORDER — DIPHENOXYLATE-ATROPINE 2.5-0.025 MG PO TABS: 1.0000 | ORAL_TABLET | Freq: Four times a day (QID) | ORAL | 0 refills | Status: DC | PRN

## 2016-01-11 NOTE — Telephone Encounter (Signed)
Patient calling c/o diarrhea x 2 days.  Thinks that she has a virus.  Is asking for medication.  C/o some nausea that is resolving on its own.  Standing order initiated.   Vomiting No.    Recommended treatment Hydration is important Fluids small frequent amounts as tolerated Good hygiene reduces transmission among family members Review Brat diet  Zofran 4 mg 1 tablet daily as needed for nausea and vomiting no more than 6 tablets   DiarrheaYes.    Recommended treatment  Imodium OTC  Can also offer Lomotil 1 tablet 4 times daily as needed no more than 10 tablets Good hygiene reduces transmission among family members Review Brat Diet  If patient starts to feel light headed or not passing much urine or becoming dehydrated will need to go to emergency room for IV hydration no complaints   Please call office if symptoms worsen or do not improve after 2-3 days

## 2016-01-15 ENCOUNTER — Other Ambulatory Visit: Payer: Self-pay | Admitting: Family Medicine

## 2016-01-17 ENCOUNTER — Encounter: Payer: Self-pay | Admitting: Family Medicine

## 2016-01-17 DIAGNOSIS — M25562 Pain in left knee: Secondary | ICD-10-CM

## 2016-01-17 DIAGNOSIS — M25561 Pain in right knee: Secondary | ICD-10-CM | POA: Insufficient documentation

## 2016-01-17 NOTE — Progress Notes (Signed)
   Renee Waters     MRN: [id number]      DOB: 1945/11/05   HPI Ms. Leh is here for follow up and re-evaluation of chronic medical conditions, medication management and review of any available recent lab and radiology data.  Preventive health is updated, specifically  Cancer screening and Immunization.   Questions or concerns regarding consultations or procedures which the PT has had in the interim are  addressed. The PT requests alternate PPI , as feels more effective than the one she is currently using.  C/o generalized uncontrolled pain, has upcoming appt with new Pain clinic, also c/o bilateral knee pain, left greater than right in past 3 months  ROS Denies recent fever or chills. Denies sinus pressure, nasal congestion, ear pain or sore throat. Denies chest congestion, productive cough or wheezing. Denies chest pains, palpitations and leg swelling Denies  Vomiting,uses daily stool softener and laxative   Denies dysuria, frequency, hesitancy or incontinence.  Has once to twice weekly headaches,not taking topamax as prescribed , denies seizures, numbness, or tingling. Denies depression, anxiety or insomnia. Denies skin break down or rash.   PE  BP 114/72   Pulse 90   Resp 16   Ht 5\' 5"  (1.651 m)   Wt 121 lb 12.8 oz (55.2 kg)   SpO2 98%   BMI 20.27 kg/m   Patient alert and oriented and in no cardiopulmonary distress.  HEENT: No facial asymmetry, EOMI,   oropharynx pink and moist.  Neck supple no JVD, no mass.  Chest: Clear to auscultation bilaterally.  CVS: S1, S2 no murmurs, no S3.Regular rate.  ABD: Soft non tender. No organomegaly or mass. Rectal , no mass, heme negative stool  Ext: No edema  [id number]  ROM spine,  and knees.  Skin: Intact, no ulcerations or rash noted.  Psych: Good eye contact, normal affect. Memory intact not anxious or depressed appearing.  CNS: CN 2-12 intact, power,  normal throughout.no focal deficits noted.   Assessment &  Plan  Essential hypertension Controlled, no change in medication DASH diet and commitment to daily physical activity for a minimum of 30 minutes discussed and encouraged, as a part of hypertension management. The importance of attaining a healthy weight is also discussed.  BP/Weight 01/05/2016 10/26/2015 10/05/2015 09/14/2015 09/09/2015 08/17/2015 [id number]  Systolic BP [id number]  Diastolic BP 72 68 80 91 76 76 78  Wt. (Lbs) 121.8 121.6 120.8 - 119 111.5 117.8  BMI 20.27 20.24 20.1 - 19.8 18.55 19.6       Depression with anxiety Controlled, no change in medication   GERD (gastroesophageal reflux disease) Uncontrolled on current medication, change made to alternate PPI  Insomnia secondary to depression with anxiety Controlled, no change in medication Sleep hygiene reviewed and written information offered also. Prescription sent for  medication needed.   GAD (generalized anxiety disorder) Controlled, no change in medication   Chronic headache disorder suboptimal control due to improper use of medication unintentionally, will now increase topamax to treating dose to reduce frequency  Osteoarthritis Generalized uncontrolled pain, will start with new  Pain management in near future  Knee pain, bilateral 3 month h/o worsening bilateral knee pain, x rays and ortho referral if indicated

## 2016-01-17 NOTE — Assessment & Plan Note (Signed)
3 month h/o worsening bilateral knee pain, x rays and ortho referral if indicated

## 2016-01-17 NOTE — Assessment & Plan Note (Signed)
Controlled, no change in medication  

## 2016-01-17 NOTE — Assessment & Plan Note (Signed)
suboptimal control due to improper use of medication unintentionally, will now increase topamax to treating dose to reduce frequency

## 2016-01-17 NOTE — Assessment & Plan Note (Signed)
Uncontrolled on current medication, change made to alternate PPI

## 2016-01-17 NOTE — Assessment & Plan Note (Signed)
Controlled, no change in medication Sleep hygiene reviewed and written information offered also. Prescription sent for  medication needed.  

## 2016-01-17 NOTE — Assessment & Plan Note (Signed)
Generalized uncontrolled pain, will start with new  Pain management in near future

## 2016-01-17 NOTE — Assessment & Plan Note (Signed)
Controlled, no change in medication DASH diet and commitment to daily physical activity for a minimum of 30 minutes discussed and encouraged, as a part of hypertension management. The importance of attaining a healthy weight is also discussed.  BP/Weight 01/05/2016 10/26/2015 10/05/2015 09/14/2015 09/09/2015 08/17/2015 [id number]  Systolic BP [id number]  Diastolic BP 72 68 80 91 76 76 78  Wt. (Lbs) 121.8 121.6 120.8 - 119 111.5 117.8  BMI 20.27 20.24 20.1 - 19.8 18.55 19.6

## 2016-01-18 ENCOUNTER — Other Ambulatory Visit: Payer: Self-pay

## 2016-01-18 MED ORDER — PANTOPRAZOLE SODIUM 40 MG PO TBEC: 40.0000 mg | DELAYED_RELEASE_TABLET | Freq: Every day | ORAL | 11 refills | Status: DC

## 2016-01-20 ENCOUNTER — Encounter: Payer: Self-pay | Admitting: Orthopedic Surgery

## 2016-01-20 ENCOUNTER — Ambulatory Visit (INDEPENDENT_AMBULATORY_CARE_PROVIDER_SITE_OTHER): Payer: Medicare Other | Admitting: Orthopedic Surgery

## 2016-01-20 ENCOUNTER — Other Ambulatory Visit: Payer: Self-pay | Admitting: Family Medicine

## 2016-01-20 VITALS — BP 149/80 | HR 89 | Ht 65.0 in | Wt 119.0 lb

## 2016-01-20 DIAGNOSIS — D509 Iron deficiency anemia, unspecified: Secondary | ICD-10-CM | POA: Diagnosis not present

## 2016-01-20 DIAGNOSIS — E559 Vitamin D deficiency, unspecified: Secondary | ICD-10-CM | POA: Diagnosis not present

## 2016-01-20 DIAGNOSIS — M1712 Unilateral primary osteoarthritis, left knee: Secondary | ICD-10-CM | POA: Diagnosis not present

## 2016-01-20 DIAGNOSIS — I10 Essential (primary) hypertension: Secondary | ICD-10-CM | POA: Diagnosis not present

## 2016-01-20 MED ORDER — MELOXICAM 7.5 MG PO TABS: 7.5000 mg | ORAL_TABLET | Freq: Every day | ORAL | 5 refills | Status: DC

## 2016-01-20 NOTE — Progress Notes (Signed)
Patient ID: Renee Waters, female   DOB: 19-Mar-1945, 70 y.o.   MRN: [id number]  Chief Complaint  Patient presents with  . Knee Pain    BILATERAL KNEE PAIN    HPI:70 year old female with bilateral knee pain left worse than right. No prior treatments for osteoarthritis.  No previous trauma  Dull aching pain medial joint of left knee moderate in severity present for several months constant but worse with exercise and weightbearing associated with mild swelling  Review of Systems  Constitutional: Negative for chills, fever and weight loss.  Respiratory: Negative for shortness of breath.   Cardiovascular: Negative for chest pain.  Musculoskeletal: Positive for back pain.  Neurological: Negative for tingling.      Past Medical History:  Diagnosis Date  . Anemia   . Barrett's esophagus   . Chronic back pain   . Chronic neck pain   . Depression   . Genital herpes   . GERD (gastroesophageal reflux disease)   . History of blood transfusion   . Hypertension   . Insomnia   . Osteoarthritis   . S/P colonoscopy June 2005   normal, no polyps  . S/P endoscopy June 2005, Oct 2009   2005: short-segment Barrett's, 2009: short-segment Barrett's  . UTI (lower urinary tract infection) 10/14   currently on Penicillin-  states is improving    PHYSICAL EXAM  BP (!) 149/80   Pulse 89   Ht 5\' 5"  (1.651 m)   Wt 119 lb (54 kg)   BMI 19.80 kg/m  GENERAL appearance reveals no gross abnormalities  MENTAL STATUS we note that the patient is awake alert and oriented to person place and time MOOD/AFFECT ARE NORMAL   GAIT She is acquiring a cane for ambulation mainly because of her back problem   EXAM OF THE left and right KNEE SKIN no erythema lacerations or ecchymosis  INSPECTION medial joint line tenderness in both knees without effusion in either knee ROM knee flexion extension 0-135 in the right and left knee STABILITY tests were normal in each knee MOTOR GRADE 5/5 without atrophy  in both quadriceps muscles   Vascular both lower extremities exhibited  capillary refill excellent warmth to the extremity   BOTH LEGS: NEURO sensation and no pathologic reflexes; LYMPH deferred noncontributory   IMAGING STUDIES  I have independently reviewed the x-rays and I interpreted the x-rays as follows: right knee shows mild arthritis as does the left 3 views were done of both knees  primary osteoarthritis of the Both right and left knee  Recommend oral medication mobile 7.5 mg daily  0 AM Arther Abbott, MD 01/20/2016

## 2016-01-21 ENCOUNTER — Other Ambulatory Visit: Payer: Self-pay

## 2016-01-21 ENCOUNTER — Telehealth: Payer: Self-pay

## 2016-01-21 LAB — CBC WITH DIFFERENTIAL/PLATELET
BASOS: 1 %
Basophils Absolute: 0 10*3/uL (ref 0.0–0.2)
EOS (ABSOLUTE): 0.1 10*3/uL (ref 0.0–0.4)
Eos: 2 %
HEMOGLOBIN: 11.5 g/dL (ref 11.1–15.9)
Hematocrit: 36.1 % (ref 34.0–46.6)
IMMATURE GRANS (ABS): 0 10*3/uL (ref 0.0–0.1)
Immature Granulocytes: 0 %
LYMPHS: 20 %
Lymphocytes Absolute: 1.1 10*3/uL (ref 0.7–3.1)
MCH: 28.7 pg (ref 26.6–33.0)
MCHC: 31.9 g/dL (ref 31.5–35.7)
MCV: 90 fL (ref 79–97)
Monocytes Absolute: 0.4 10*3/uL (ref 0.1–0.9)
Monocytes: 7 %
NEUTROS ABS: 4 10*3/uL (ref 1.4–7.0)
Neutrophils: 70 %
PLATELETS: 254 10*3/uL (ref 150–379)
RBC: 4.01 x10E6/uL (ref 3.77–5.28)
RDW: 14.1 % (ref 12.3–15.4)
WBC: 5.6 10*3/uL (ref 3.4–10.8)

## 2016-01-21 LAB — COMPREHENSIVE METABOLIC PANEL
A/G RATIO: 1.2 (ref 1.2–2.2)
ALBUMIN: 4 g/dL (ref 3.5–4.8)
ALT: 8 IU/L (ref 0–32)
AST: 15 IU/L (ref 0–40)
Alkaline Phosphatase: 117 IU/L (ref 39–117)
BUN / CREAT RATIO: 13 (ref 12–28)
BUN: 10 mg/dL (ref 8–27)
Bilirubin Total: 0.4 mg/dL (ref 0.0–1.2)
CO2: 23 mmol/L (ref 18–29)
Calcium: 9.3 mg/dL (ref 8.7–10.3)
Chloride: 105 mmol/L (ref 96–106)
Creatinine, Ser: 0.79 mg/dL (ref 0.57–1.00)
GFR calc non Af Amer: 76 mL/min/{1.73_m2} (ref >59)
GFR, EST AFRICAN AMERICAN: 88 mL/min/{1.73_m2} (ref >59)
Globulin, Total: 3.4 g/dL (ref 1.5–4.5)
Glucose: 80 mg/dL (ref 65–99)
POTASSIUM: 4.4 mmol/L (ref 3.5–5.2)
Sodium: 143 mmol/L (ref 134–144)
TOTAL PROTEIN: 7.4 g/dL (ref 6.0–8.5)

## 2016-01-21 LAB — VITAMIN D 25 HYDROXY (VIT D DEFICIENCY, FRACTURES): Vit D, 25-Hydroxy: 67 ng/mL (ref 30.0–100.0)

## 2016-01-21 LAB — TSH: TSH: 1.14 u[IU]/mL (ref 0.450–4.500)

## 2016-01-21 MED ORDER — OXYCODONE HCL 10 MG PO TABS: 10.0000 mg | ORAL_TABLET | Freq: Four times a day (QID) | ORAL | 0 refills | Status: DC

## 2016-01-21 NOTE — Telephone Encounter (Signed)
Ok to print and give one month at same dose, print , I will signwill need another script in January but ONLY enough to last till her appt with pain clinic, make special note for Jan script when due

## 2016-01-21 NOTE — Telephone Encounter (Signed)
Noted.  Patient aware.  Script available for signature.

## 2016-01-22 ENCOUNTER — Other Ambulatory Visit: Payer: Self-pay

## 2016-01-29 ENCOUNTER — Telehealth: Payer: Self-pay | Admitting: Gastroenterology

## 2016-01-29 NOTE — Telephone Encounter (Signed)
Please NIC for ov with rmr only in 04/2016. Dx: f/u abd pain, weight loss

## 2016-02-03 NOTE — Telephone Encounter (Signed)
ON RECALL  °

## 2016-02-04 ENCOUNTER — Ambulatory Visit: Payer: Medicare Other

## 2016-02-04 ENCOUNTER — Ambulatory Visit (INDEPENDENT_AMBULATORY_CARE_PROVIDER_SITE_OTHER): Payer: Medicare Other

## 2016-02-04 VITALS — BP 110/80 | HR 96 | Resp 18 | Wt 122.0 lb

## 2016-02-04 DIAGNOSIS — Z0000 Encounter for general adult medical examination without abnormal findings: Secondary | ICD-10-CM

## 2016-02-04 MED ORDER — ALPRAZOLAM 0.5 MG PO TABS: 0.5000 mg | ORAL_TABLET | Freq: Every day | ORAL | 3 refills | Status: DC

## 2016-02-04 NOTE — Patient Instructions (Signed)
Thank you for choosing [facility] for your health care needs  The Annual Wellness Visit is designed to allow [place] the chance to assist you in preserving and improving you health.   Dr. Moshe Cipro will see you back in 4 months for a follow up with rectal   If you need any labs the order will be mailed to you  Preventive Care for Adults  A healthy lifestyle and preventive care can promote health and wellness. Preventive health guidelines for adults include the following key practices.  . A routine yearly physical is a good way to check with your health care provider about your health and preventive screening. It is a chance to share any concerns and updates on your health and to receive a thorough exam.  . Visit your dentist for a routine exam and preventive care every 6 months. Brush your teeth twice a day and floss once a day. Good oral hygiene prevents tooth decay and gum disease.  . The frequency of eye exams is based on your age, health, family medical history, use  of contact lenses, and other factors. Follow your health care provider's ecommendations for frequency of eye exams.  . Eat a healthy diet. Foods like vegetables, fruits, whole grains, low-fat dairy products, and lean protein foods contain the nutrients you need without too many calories. Decrease your intake of foods high in solid fats, added sugars, and salt. Eat the right amount of calories for you. Get information about a proper diet from your health care provider, if necessary.  . Regular physical exercise is one of the most important things you can do for your health. Most adults should get at least 150 minutes of moderate-intensity exercise (any activity that increases your heart rate and causes you to sweat) each week. In addition, most adults need muscle-strengthening exercises on 2 or more days a week.  Silver Sneakers may be a benefit available to you. To determine eligibility, you may visit the website:  www.silversneakers.com or contact program at [phone] Mon-Fri between 8AM-8PM.   . Maintain a healthy weight. The body mass index (BMI) is a screening tool to identify possible weight problems. It provides an estimate of body fat based on height and weight. Your health care provider can find your BMI and can help you achieve or maintain a healthy weight.   For adults 20 years and older: ? A BMI below 18.5 is considered underweight. ? A BMI of 18.5 to 24.9 is normal. ? A BMI of 25 to 29.9 is considered overweight. ? A BMI of 30 and above is considered obese.   . Maintain normal blood lipids and cholesterol levels by exercising and minimizing your intake of saturated fat. Eat a balanced diet with plenty of fruit and vegetables. Blood tests for lipids and cholesterol should begin at age 41 and be repeated every 5 years. If your lipid or cholesterol levels are high, you are over 50, or you are at high risk for heart disease, you may need your cholesterol levels checked more frequently. Ongoing high lipid and cholesterol levels should be treated with medicines if diet and exercise are not working.  . If you smoke, find out from your health care provider how to quit. If you do not use tobacco, please do not start.  . If you choose to drink alcohol, please do not consume more than 2 drinks per day. One drink is considered to be 12 ounces (355 mL) of beer, 5 ounces (148 mL) of  wine, or 1.5 ounces (44 mL) of liquor.  . If you are 75-54 years old, ask your health care provider if you should take aspirin to prevent strokes.  . Use sunscreen. Apply sunscreen liberally and repeatedly throughout the day. You should seek shade when your shadow is shorter than you. Protect yourself by wearing long sleeves, pants, a wide-brimmed hat, and sunglasses year round, whenever you are outdoors.  . Once a month, do a whole body skin exam, using a mirror to look at the skin on your back. Tell your health care  provider of new moles, moles that have irregular borders, moles that are larger than a pencil eraser, or moles that have changed in shape or color.

## 2016-02-04 NOTE — Assessment & Plan Note (Signed)
Annual exam as documented. . Immunization and cancer screening needs are specifically addressed at this visit.  

## 2016-02-04 NOTE — Progress Notes (Addendum)
Subjective:   Renee Waters is a 70 y.o. female who presents for Medicare Annual (Subsequent) preventive examination.  Cardiac Risk Factors include: advanced age (>54men, >44 women);hypertension     Objective:     Vitals: BP 110/80   Pulse 96   Resp 18   Wt 122 lb (55.3 kg)   SpO2 97%   BMI 20.30 kg/m   Body mass index is 20.3 kg/m.   Tobacco History  Smoking Status  . Former Smoker  . Packs/day: 0.25  . Years: 25.00  . Types: Cigarettes  . Quit date: 02/07/2003  Smokeless Tobacco  . Never Used    Comment: quit in 2004       Past Medical History:  Diagnosis Date  . Anemia   . Barrett's esophagus   . Chronic back pain   . Chronic neck pain   . Depression   . Genital herpes   . GERD (gastroesophageal reflux disease)   . History of blood transfusion   . Hypertension   . Insomnia   . Osteoarthritis   . S/P colonoscopy June 2005   normal, no polyps  . S/P endoscopy June 2005, Oct 2009   2005: short-segment Barrett's, 2009: short-segment Barrett's  . UTI (lower urinary tract infection) 10/14   currently on Penicillin-  states is improving   Past Surgical History:  Procedure Laterality Date  . ABDOMINAL HYSTERECTOMY    . BIOPSY N/A 03/20/2014   Procedure: BIOPSY;  Surgeon: Daneil Dolin, MD;  Location: [facility];  Service: Endoscopy;  Laterality: N/A;  . BIOPSY  09/14/2015   Procedure: BIOPSY;  Surgeon: Daneil Dolin, MD;  [facility];  Service: Endoscopy;;  esophageal and gastric  . CARPAL TUNNEL RELEASE Left 2013  . cervical disectomy  2002  . CHOLECYSTECTOMY     with lysis of adhesions for sbo; "ruptured gallbladder".  . COLONOSCOPY  11/09/2011   RMR: Melanosis coli  . COLONOSCOPY WITH PROPOFOL N/A 09/14/2015   Procedure: COLONOSCOPY WITH PROPOFOL;  Surgeon: Daneil Dolin, MD;  [facility];  Service: Endoscopy;  Laterality: N/A;  730  . DENTAL SURGERY  11/2015   multiple tooth extraction  . ESOPHAGOGASTRODUODENOSCOPY  11/29/2007    salmon-colored  tongue   longest stable at  3 cm, distal esophagus as described previously status post biopsy/ Hiatal hernia, otherwise normal stomach D1 and D2  . ESOPHAGOGASTRODUODENOSCOPY  01/06/11   short segment Barrett's esophagus s/p bx/Hiatal hernia  . ESOPHAGOGASTRODUODENOSCOPY (EGD) WITH PROPOFOL N/A 03/20/2014   [id number] distal esophagus short segment barrett's, bx with no dysplasia. next egd in 03/2017  . ESOPHAGOGASTRODUODENOSCOPY (EGD) WITH PROPOFOL N/A 09/14/2015   Procedure: ESOPHAGOGASTRODUODENOSCOPY (EGD) WITH PROPOFOL;  Surgeon: Daneil Dolin, MD;  [facility];  Service: Endoscopy;  Laterality: N/A;  . HERNIA REPAIR Right 07/2010   Dr. Zada Girt  . POLYPECTOMY  09/14/2015   Procedure: POLYPECTOMY;  Surgeon: Daneil Dolin, MD;  [facility];  Service: Endoscopy;;  ascending colon  . right hip replacement  07/2010   went back in sept 2012 to fix  . SHOULDER ARTHROSCOPY  2008   left  . TOTAL HIP REVISION Right 12/17/2012   Procedure: RIGHT TOTAL HIP REVISION;  Surgeon: Mauri Pole, MD;  Location: [facility];  Service: Orthopedics;  Laterality: Right;  . WRIST SURGERY Right 2011   open reduction right wrist.   Family History  Problem Relation Age of Onset  . Hypertension Mother   .  Stroke Mother   . Colon cancer Neg Hx   . Anesthesia problems Neg Hx   . Hypotension Neg Hx   . Malignant hyperthermia Neg Hx   . Pseudochol deficiency Neg Hx    History  Sexual Activity  . Sexual activity: Yes  . Birth control/ protection: Surgical    Outpatient Encounter Prescriptions as of 02/04/2016  Medication Sig  . acyclovir (ZOVIRAX) 800 MG tablet TAKE ONE TABLET BY MOUTH 4 TIMES DAILY AS NEEDED FOR  BREAKOUTS  . ALPRAZolam (XANAX) 0.5 MG tablet Take 1 tablet (0.5 mg total) by mouth daily.  . citalopram (CELEXA) 20 MG tablet Take 1 tablet (20 mg total) by mouth daily.  . diphenoxylate-atropine (LOMOTIL) 2.5-0.025 MG tablet Take 1 tablet by mouth 4  (four) times daily as needed for diarrhea or loose stools.  . gabapentin (NEURONTIN) 300 MG capsule One capsule once daily for chronic pain  . meloxicam (MOBIC) 7.5 MG tablet Take 1 tablet (7.5 mg total) by mouth daily.  . Multiple Vitamins-Minerals (MULTIVITAMINS THER. W/MINERALS) TABS Take 1 tablet by mouth daily.   . Oxycodone HCl 10 MG TABS Take 1 tablet (10 mg total) by mouth 4 (four) times daily.  . pantoprazole (PROTONIX) 40 MG tablet Take 1 tablet (40 mg total) by mouth daily before breakfast.  . senna (SENOKOT) 8.6 MG TABS tablet Take 2 tablets by mouth daily as needed for mild constipation.  . temazepam (RESTORIL) 30 MG capsule TAKE 1 CAPSULE BY MOUTH EVERY NIGHT AT BEDTIME  . tiZANidine (ZANAFLEX) 4 MG tablet Take 1 tablet (4 mg total) by mouth at bedtime.  . topiramate (TOPAMAX) 50 MG tablet TAKE ONE TABLET BY MOUTH TWICE DAILY  . valsartan (DIOVAN) 80 MG tablet TAKE ONE TABLET BY MOUTH ONCE DAILY  . Vitamin D, Ergocalciferol, (DRISDOL) 50000 units CAPS capsule TAKE 1 CAPSULE BY MOUTH ONCE A WEEK  . [DISCONTINUED] ALPRAZolam (XANAX) 0.5 MG tablet TAKE 1 TABLET BY MOUTH EVERY DAY   No facility-administered encounter medications on file as of 02/04/2016.     Activities of Daily Living In your present state of health, do you have any difficulty performing the following activities: 02/04/2016 09/09/2015  Hearing? N N  Vision? N N  Difficulty concentrating or making decisions? Y N  Walking or climbing stairs? Y Y  Dressing or bathing? N N  Doing errands, shopping? N N  Preparing Food and eating ? N -  Using the Toilet? N -  In the past six months, have you accidently leaked urine? N -  Do you have problems with loss of bowel control? N -  Managing your Medications? N -  Managing your Finances? N -  Housekeeping or managing your Housekeeping? Y -  Some recent data might be hidden    Patient Care Team: [name], MD as PCP - General Daneil Dolin, MD  (Gastroenterology) Carole Civil, MD as Consulting Physician (Orthopedic Surgery)    Assessment:  Medicare annual wellness visit, subsequent Annual exam as documented.  Immunization and cancer screening needs are specifically addressed at this visit.     Exercise Activities and Dietary recommendations Current Exercise Habits: The patient does not participate in regular exercise at present, Exercise limited by: orthopedic condition(s)  Goals    None     Fall Risk Fall Risk  02/04/2016 10/05/2015 07/03/2014  Falls in the past year? Yes No No  Number falls in past yr: 1 - -  Injury with Fall? No - -  Risk for fall due to : Impaired balance/gait - -  Follow up Falls prevention discussed - -   Depression Screen PHQ 2/9 Scores 02/04/2016 07/02/2015 07/03/2014  PHQ - 2 Score 2 2 6   PHQ- 9 Score 5 10 25      Cognitive Function Patient expresses that she feels that she has had some decline with long term memory.  Is working with family at attempting to recapture some of this through books, pictures, etc.    6CIT Screen 02/04/2016  What Year? 0 points  What month? 0 points  What time? 0 points  Count back from 20 0 points  Months in reverse 0 points  Repeat phrase 0 points  Total Score 0    Immunization History  Administered Date(s) Administered  . Influenza Split 11/08/2010, 12/06/2011  . Influenza Whole 11/06/2006, 11/24/2009  . Influenza,inj,Quad PF,36+ Mos 10/31/2012, 01/14/2014, 11/25/2014, 10/05/2015  . Pneumococcal Conjugate-13 09/17/2013  . Pneumococcal Polysaccharide-23 02/22/2011  . Td 07/31/2007  . Zoster 07/31/2007   Screening Tests Health Maintenance  Topic Date Due  . MAMMOGRAM  03/12/2016  . TETANUS/TDAP  07/30/2017  . COLONOSCOPY  09/13/2025  . INFLUENZA VACCINE  Completed  . DEXA SCAN  Completed  . ZOSTAVAX  Completed  . Hepatitis C Screening  Completed  . PNA vac Low Risk Adult  Completed      Plan:   Will look over preventative screening  information provided and will decide on bone density prior to coming for next office visit.   During the course of the visit the patient was educated and counseled about the following appropriate screening and preventive services:   Vaccines to include Pneumoccal, Influenza, Hepatitis B, Td, Zostavax, HCV  Electrocardiogram  Cardiovascular Disease  Colorectal cancer screening  Bone density screening  Diabetes screening  Glaucoma screening  Mammography/PAP  Nutrition counseling   Patient Instructions (the written plan) was given to the patient.   Denman George [place], [place]  [id number]

## 2016-02-17 ENCOUNTER — Ambulatory Visit (HOSPITAL_COMMUNITY)
Admission: RE | Admit: 2016-02-17 | Discharge: 2016-02-17 | Disposition: A | Discharge status: Home / Self Care | Payer: Medicare Other | Source: Ambulatory Visit | Attending: Family Medicine | Admitting: Family Medicine

## 2016-02-17 DIAGNOSIS — Z1231 Encounter for screening mammogram for malignant neoplasm of breast: Secondary | ICD-10-CM | POA: Diagnosis not present

## 2016-02-21 ENCOUNTER — Other Ambulatory Visit: Payer: Self-pay | Admitting: Family Medicine

## 2016-02-23 ENCOUNTER — Other Ambulatory Visit: Payer: Self-pay

## 2016-02-23 ENCOUNTER — Other Ambulatory Visit: Payer: Self-pay | Admitting: Family Medicine

## 2016-02-23 MED ORDER — OXYCODONE HCL 10 MG PO TABS: 10.0000 mg | ORAL_TABLET | Freq: Four times a day (QID) | ORAL | 0 refills | Status: DC

## 2016-03-08 DIAGNOSIS — Z79891 Long term (current) use of opiate analgesic: Secondary | ICD-10-CM | POA: Diagnosis not present

## 2016-03-08 DIAGNOSIS — M4155 Other secondary scoliosis, thoracolumbar region: Secondary | ICD-10-CM | POA: Diagnosis not present

## 2016-03-08 DIAGNOSIS — K5900 Constipation, unspecified: Secondary | ICD-10-CM | POA: Diagnosis not present

## 2016-03-08 DIAGNOSIS — G894 Chronic pain syndrome: Secondary | ICD-10-CM | POA: Diagnosis not present

## 2016-03-08 DIAGNOSIS — M47816 Spondylosis without myelopathy or radiculopathy, lumbar region: Secondary | ICD-10-CM | POA: Diagnosis not present

## 2016-03-08 DIAGNOSIS — M47812 Spondylosis without myelopathy or radiculopathy, cervical region: Secondary | ICD-10-CM | POA: Diagnosis not present

## 2016-03-17 ENCOUNTER — Encounter: Payer: Self-pay | Admitting: Internal Medicine

## 2016-03-19 ENCOUNTER — Other Ambulatory Visit: Payer: Self-pay | Admitting: Family Medicine

## 2016-03-30 ENCOUNTER — Other Ambulatory Visit: Payer: Self-pay | Admitting: Family Medicine

## 2016-03-31 ENCOUNTER — Other Ambulatory Visit: Payer: Self-pay | Admitting: Family Medicine

## 2016-04-01 NOTE — Telephone Encounter (Signed)
lst vit d level 67 on 12 13 17   Do you want to reorder?

## 2016-04-06 DIAGNOSIS — M47816 Spondylosis without myelopathy or radiculopathy, lumbar region: Secondary | ICD-10-CM | POA: Diagnosis not present

## 2016-04-06 DIAGNOSIS — G894 Chronic pain syndrome: Secondary | ICD-10-CM | POA: Diagnosis not present

## 2016-04-06 DIAGNOSIS — M47812 Spondylosis without myelopathy or radiculopathy, cervical region: Secondary | ICD-10-CM | POA: Diagnosis not present

## 2016-04-06 DIAGNOSIS — Z79891 Long term (current) use of opiate analgesic: Secondary | ICD-10-CM | POA: Diagnosis not present

## 2016-04-25 ENCOUNTER — Telehealth: Payer: Self-pay

## 2016-04-25 NOTE — Telephone Encounter (Signed)
patient left message complaining of excessive itching and was wanting to know what to do/take. Called pt to get more information but had to leave a message

## 2016-04-26 ENCOUNTER — Other Ambulatory Visit: Payer: Self-pay | Admitting: Family Medicine

## 2016-04-26 DIAGNOSIS — I10 Essential (primary) hypertension: Secondary | ICD-10-CM

## 2016-04-29 ENCOUNTER — Encounter: Payer: Self-pay | Admitting: Internal Medicine

## 2016-04-29 ENCOUNTER — Other Ambulatory Visit: Payer: Self-pay | Admitting: Family Medicine

## 2016-04-29 ENCOUNTER — Ambulatory Visit (INDEPENDENT_AMBULATORY_CARE_PROVIDER_SITE_OTHER): Payer: Medicare Other | Admitting: Internal Medicine

## 2016-04-29 VITALS — BP 157/98 | HR 85 | Temp 98.0°F | Ht 65.0 in | Wt 115.2 lb

## 2016-04-29 DIAGNOSIS — K5900 Constipation, unspecified: Secondary | ICD-10-CM | POA: Diagnosis not present

## 2016-04-29 DIAGNOSIS — K2270 Barrett's esophagus without dysplasia: Secondary | ICD-10-CM | POA: Diagnosis not present

## 2016-04-29 DIAGNOSIS — K219 Gastro-esophageal reflux disease without esophagitis: Secondary | ICD-10-CM

## 2016-04-29 DIAGNOSIS — R634 Abnormal weight loss: Secondary | ICD-10-CM | POA: Diagnosis not present

## 2016-04-29 MED ORDER — HYDROXYZINE HCL 10 MG PO TABS: 10.0000 mg | ORAL_TABLET | Freq: Three times a day (TID) | ORAL | 0 refills | Status: DC | PRN

## 2016-04-29 NOTE — Telephone Encounter (Signed)
Hydroxyzine lowest dose sent to [facility], 3 times daily as needed.  pls make  Her aware of potential sedative s/e, thanks

## 2016-04-29 NOTE — Progress Notes (Signed)
Primary Care Physician:  Tula Nakayama, MD Primary Gastroenterologist:  Dr. Gala Romney  Pre-Procedure History & Physical: HPI:  Renee Waters is a 71 y.o. female here for follow-up abdominal pain and weight loss.  Patient states right upper quadrant abdominal pain has gotten better. She's lost another 7 pounds. She is a dentulous today. Having trouble getting her dentures don't fit right. She's been taking SOME LIQUID NUTRITION. HASN'T HAD ANY MELENA OR RECTAL BLEEDING. HAS A DIFFICULT TIME WITH CONSTIPATION IN THE SETTING OF OPIOID THERAPY. TAKES SENNA  / prunes MULTIPLE TIMES DAILY. 2017 CT scan reviewed. Reflux symptoms well controlled on pantoprazole. Should be getting her new dentures in the next couple of weeks.  Past Medical History:  Diagnosis Date  . Anemia   . Barrett's esophagus   . Chronic back pain   . Chronic neck pain   . Depression   . Genital herpes   . GERD (gastroesophageal reflux disease)   . History of blood transfusion   . Hypertension   . Insomnia   . Osteoarthritis   . S/P colonoscopy June 2005   normal, no polyps  . S/P endoscopy June 2005, Oct 2009   2005: short-segment Barrett's, 2009: short-segment Barrett's  . UTI (lower urinary tract infection) 10/14   currently on Penicillin-  states is improving    Past Surgical History:  Procedure Laterality Date  . ABDOMINAL HYSTERECTOMY    . BIOPSY N/A 03/20/2014   Procedure: BIOPSY;  Surgeon: Daneil Dolin, MD;  Location: [facility];  Service: Endoscopy;  Laterality: N/A;  . BIOPSY  09/14/2015   Procedure: BIOPSY;  Surgeon: Daneil Dolin, MD;  [facility];  Service: Endoscopy;;  esophageal and gastric  . CARPAL TUNNEL RELEASE Left 2013  . cervical disectomy  2002  . CHOLECYSTECTOMY     with lysis of adhesions for sbo; "ruptured gallbladder".  . COLONOSCOPY  11/09/2011   RMR: Melanosis coli  . COLONOSCOPY WITH PROPOFOL N/A 09/14/2015   Procedure: COLONOSCOPY WITH PROPOFOL;  Surgeon: Daneil Dolin, MD;  [facility];  Service: Endoscopy;  Laterality: N/A;  730  . DENTAL SURGERY  11/2015   multiple tooth extraction  . ESOPHAGOGASTRODUODENOSCOPY  11/29/2007   salmon-colored  tongue   longest stable at  3 cm, distal esophagus as described previously status post biopsy/ Hiatal hernia, otherwise normal stomach D1 and D2  . ESOPHAGOGASTRODUODENOSCOPY  01/06/11   short segment Barrett's esophagus s/p bx/Hiatal hernia  . ESOPHAGOGASTRODUODENOSCOPY (EGD) WITH PROPOFOL N/A 03/20/2014   [id number] distal esophagus short segment barrett's, bx with no dysplasia. next egd in 03/2017  . ESOPHAGOGASTRODUODENOSCOPY (EGD) WITH PROPOFOL N/A 09/14/2015   Procedure: ESOPHAGOGASTRODUODENOSCOPY (EGD) WITH PROPOFOL;  Surgeon: Daneil Dolin, MD;  [facility];  Service: Endoscopy;  Laterality: N/A;  . HERNIA REPAIR Right 07/2010   Dr. Zada Girt  . POLYPECTOMY  09/14/2015   Procedure: POLYPECTOMY;  Surgeon: Daneil Dolin, MD;  [facility];  Service: Endoscopy;;  ascending colon  . right hip replacement  07/2010   went back in sept 2012 to fix  . SHOULDER ARTHROSCOPY  2008   left  . TOTAL HIP REVISION Right 12/17/2012   Procedure: RIGHT TOTAL HIP REVISION;  Surgeon: Mauri Pole, MD;  Location: [facility];  Service: Orthopedics;  Laterality: Right;  . WRIST SURGERY Right 2011   open reduction right wrist.    Prior to Admission medications   Medication Sig Start Date End Date Taking?  Authorizing Provider  acyclovir (ZOVIRAX) 800 MG tablet TAKE ONE TABLET BY MOUTH 4 TIMES DAILY AS NEEDED FOR  BREAKOUTS 02/22/16   [name], MD  ALPRAZolam Duanne Moron) 0.5 MG tablet Take 1 tablet (0.5 mg total) by mouth daily. 02/04/16   [name], MD  citalopram (CELEXA) 20 MG tablet Take 1 tablet (20 mg total) by mouth daily. 07/02/15   [name], MD  diphenoxylate-atropine (LOMOTIL) 2.5-0.025 MG tablet Take 1 tablet by mouth 4 (four) times daily as needed for diarrhea  or loose stools. 01/11/16   [name], MD  gabapentin (NEURONTIN) 300 MG capsule One capsule once daily for chronic pain 01/05/16   [name], MD  hydrOXYzine (ATARAX/VISTARIL) 10 MG tablet Take 1 tablet (10 mg total) by mouth 3 (three) times daily as needed. 04/29/16   [name], MD  meloxicam (MOBIC) 7.5 MG tablet Take 1 tablet (7.5 mg total) by mouth daily. 01/20/16   Carole Civil, MD  Multiple Vitamins-Minerals (MULTIVITAMINS THER. W/MINERALS) TABS Take 1 tablet by mouth daily.     Historical Provider, MD  Oxycodone HCl 10 MG TABS Take 1 tablet (10 mg total) by mouth 4 (four) times daily. 02/23/16   [name], MD  pantoprazole (PROTONIX) 40 MG tablet Take 1 tablet (40 mg total) by mouth daily before breakfast. 01/18/16   [name], MD  senna (SENOKOT) 8.6 MG TABS tablet Take 2 tablets by mouth daily as needed for mild constipation.    Historical Provider, MD  temazepam (RESTORIL) 30 MG capsule TAKE 1 CAPSULE BY MOUTH EVERY NIGHT AT BEDTIME 03/30/16   [name], MD  tiZANidine (ZANAFLEX) 4 MG tablet TAKE ONE TABLET BY MOUTH AT BEDTIME 03/21/16   [name], MD  topiramate (TOPAMAX) 50 MG tablet TAKE ONE TABLET BY MOUTH TWICE DAILY 09/04/15   [name], MD  valsartan (DIOVAN) 80 MG tablet TAKE ONE TABLET BY MOUTH ONCE DAILY 04/26/16   [name], MD  Vitamin D, Ergocalciferol, (DRISDOL) 50000 units CAPS capsule TAKE 1 CAPSULE BY MOUTH ONCE A WEEK 10/23/15   [name], MD    Allergies as of 04/29/2016 - Review Complete 02/04/2016  Allergen Reaction Noted  . Bee venom Swelling 06/12/2012  . Oxycodone-acetaminophen Rash 01/26/2015    Family History  Problem Relation Age of Onset  . Hypertension Mother   . Stroke Mother   . Colon cancer Neg Hx   . Anesthesia problems Neg Hx   . Hypotension Neg Hx   . Malignant hyperthermia Neg Hx   . Pseudochol deficiency Neg Hx     Social History   Social History    . Marital status: Married    Spouse name: N/A  . Number of children: N/A  . Years of education: N/A   Occupational History  . Not on file.   Social History Main Topics  . Smoking status: Former Smoker    Packs/day: 0.25    Years: 25.00    Types: Cigarettes    Quit date: 02/07/2003  . Smokeless tobacco: Never Used     Comment: quit in 2004  . Alcohol use Yes     Comment: socially, rare  . Drug use: No  . Sexual activity: Yes    Birth control/ protection: Surgical   Other Topics Concern  . Not on file   Social History Narrative  . No narrative on file    Review of Systems: See HPI, otherwise  negative ROS  Physical Exam: There were no vitals taken for this visit. General:   Alert,   pleasant and cooperative in NAD Lungs:  Clear throughout to auscultation.   No wheezes, crackles, or rhonchi. No acute distress. Heart:  Regular rate and rhythm; no murmurs, clicks, rubs,  or gallops. Abdomen: Non-distended, normal bowel sounds.  Soft and nontender without appreciable mass or hepatosplenomegaly.  Pulses:  Normal pulses noted. Extremities:  Without clubbing or edema.  Impression:  Pleasant 71 year old lady with GERD/Barrett's esophagus opioid-induced constipation and weight loss. I suspect poorly fitting dentures have much to do with oral intake these days. She really needs to be on a better bowel regimen. No need for repeat endoscopic evaluation at this time.  Recommendations: Trial of Linzess 290 samples - take daily  x 2 weeks; telephone  Follow-up - 2 weeks  Continue Protonix 40 mg   Office visit in 3 months   Notice: This dictation was prepared with Dragon dictation along with smaller phrase technology. Any transcriptional errors that result from this process are unintentional and may not be corrected upon review.

## 2016-04-29 NOTE — Telephone Encounter (Signed)
Pt aware.

## 2016-04-29 NOTE — Patient Instructions (Signed)
Trial of Linzess 290 samples - take daily  x 2 weeks; telephone  Follow-up - 2 weeks  Continue Protonix 40 mg   Office visit in 3 months

## 2016-04-29 NOTE — Telephone Encounter (Signed)
Has been itching all over. Tried zyrtec and still itching. Thought it was medication related but her nerves have been really bad lately. Wants something sent in for itching and if that doesn't help she will call for appt. Please advise

## 2016-05-02 ENCOUNTER — Other Ambulatory Visit: Payer: Self-pay | Admitting: Family Medicine

## 2016-05-02 NOTE — Telephone Encounter (Signed)
Last vit d level on 12 13 17  was 63  Do you want to reorder?

## 2016-05-04 DIAGNOSIS — Z79891 Long term (current) use of opiate analgesic: Secondary | ICD-10-CM | POA: Diagnosis not present

## 2016-05-04 DIAGNOSIS — G894 Chronic pain syndrome: Secondary | ICD-10-CM | POA: Diagnosis not present

## 2016-05-04 DIAGNOSIS — M47816 Spondylosis without myelopathy or radiculopathy, lumbar region: Secondary | ICD-10-CM | POA: Diagnosis not present

## 2016-05-04 DIAGNOSIS — M47812 Spondylosis without myelopathy or radiculopathy, cervical region: Secondary | ICD-10-CM | POA: Diagnosis not present

## 2016-05-09 ENCOUNTER — Other Ambulatory Visit: Payer: Self-pay

## 2016-05-09 MED ORDER — VITAMIN D (ERGOCALCIFEROL) 1.25 MG (50000 UNIT) PO CAPS: 50000.0000 [IU] | ORAL_CAPSULE | ORAL | 1 refills | Status: DC

## 2016-05-16 ENCOUNTER — Telehealth: Payer: Self-pay | Admitting: Internal Medicine

## 2016-05-16 MED ORDER — LINACLOTIDE 290 MCG PO CAPS: 290.0000 ug | ORAL_CAPSULE | Freq: Every day | ORAL | 11 refills | Status: DC

## 2016-05-16 NOTE — Telephone Encounter (Signed)
Pt has used her Linzess samples and needs a prescription called into [facility] in [place]

## 2016-05-16 NOTE — Telephone Encounter (Signed)
Dispense 30 with 11 refills

## 2016-05-16 NOTE — Telephone Encounter (Signed)
Pt was given linzess 239mcg. Dr.Rourk, is it ok to send in Rx? How many refills?

## 2016-05-16 NOTE — Telephone Encounter (Signed)
Can try amitiza 24 mcg bid with food. Offer samples.

## 2016-05-16 NOTE — Telephone Encounter (Signed)
rx sent to the pharmacy. 

## 2016-05-16 NOTE — Telephone Encounter (Signed)
Pt called because she can not afford the Linzess. It was $300.00. She is wanting to see if Amitiz 24 mcg would work. Her husband has some at home and she is going to try that and let us know.

## 2016-05-17 NOTE — Telephone Encounter (Addendum)
Pt is aware that we don not have any samples at this time.

## 2016-05-23 MED ORDER — LINACLOTIDE 290 MCG PO CAPS: 290.0000 ug | ORAL_CAPSULE | Freq: Every day | ORAL | 3 refills | Status: DC

## 2016-05-23 NOTE — Telephone Encounter (Signed)
Pt called office and said that she tried her husband's Amitiza and it didn't work well for her. She said Linzess costs $300 and she can't afford it. She said Linzess copay card doesn't work with her insurance. She requested more Linzess 247mcg samples. 2 boxes of Linzess 279mcg sample placed at front desk and pt was informed. Also informed pt that her info could be sent to Health Dept to see if she qualifies for patient assistance to help her get Linzess.  Routing message to [employer].

## 2016-05-23 NOTE — Telephone Encounter (Signed)
done

## 2016-05-23 NOTE — Telephone Encounter (Signed)
Health dept assistance form on [facility] desk. Will need prescription printed to send with paperwork.

## 2016-05-23 NOTE — Addendum Note (Signed)
Addended by: Mahala Menghini on: 05/23/2016 12:55 PM   Modules accepted: Orders

## 2016-05-30 ENCOUNTER — Telehealth: Payer: Self-pay | Admitting: Internal Medicine

## 2016-05-30 NOTE — Telephone Encounter (Signed)
I do not have a copy of the pts [employer] card. I have called her and had to leave a message, asked her to call back and give me her [facility] ID #. Asked her to leave it on my voicemail if I was busy.

## 2016-05-30 NOTE — Telephone Encounter (Signed)
Please call patient regarding her linzess and her insurance   She has a few questions

## 2016-05-30 NOTE — Telephone Encounter (Signed)
Spoke with the pt, she said linzess is tier 3 and it is over $100 a month and she cannot afford it. Pt has [employer], informed pt that I would try to do a tier exception and if that was denied we could try pt assistance. She is aware that it may take a couple of weeks with [employer] to do tier exception and pt said that was fine.

## 2016-05-31 ENCOUNTER — Telehealth: Payer: Self-pay

## 2016-05-31 ENCOUNTER — Telehealth: Payer: Self-pay | Admitting: Family Medicine

## 2016-05-31 NOTE — Telephone Encounter (Signed)
Called pt to schedule AWV - anr

## 2016-05-31 NOTE — Telephone Encounter (Signed)
Patient has an appt for tomorrow, she wants to know when she is going to be able to get a physical, says she has not had one in over a year.  cb#: [phone]

## 2016-05-31 NOTE — Telephone Encounter (Signed)
pts [employer] ID number is [id number]. Mcarthur Rossetti phone number is [phone]. Working on tier exception.

## 2016-06-01 ENCOUNTER — Other Ambulatory Visit: Payer: Self-pay | Admitting: Family Medicine

## 2016-06-01 ENCOUNTER — Ambulatory Visit (INDEPENDENT_AMBULATORY_CARE_PROVIDER_SITE_OTHER): Payer: Medicare Other | Admitting: Family Medicine

## 2016-06-01 ENCOUNTER — Encounter: Payer: Self-pay | Admitting: Family Medicine

## 2016-06-01 VITALS — BP 120/78 | HR 87 | Resp 16 | Ht 65.0 in | Wt 121.0 lb

## 2016-06-01 DIAGNOSIS — K219 Gastro-esophageal reflux disease without esophagitis: Secondary | ICD-10-CM

## 2016-06-01 DIAGNOSIS — F418 Other specified anxiety disorders: Secondary | ICD-10-CM | POA: Diagnosis not present

## 2016-06-01 DIAGNOSIS — I10 Essential (primary) hypertension: Secondary | ICD-10-CM | POA: Diagnosis not present

## 2016-06-01 DIAGNOSIS — F5105 Insomnia due to other mental disorder: Secondary | ICD-10-CM | POA: Diagnosis not present

## 2016-06-01 DIAGNOSIS — M329 Systemic lupus erythematosus, unspecified: Secondary | ICD-10-CM | POA: Diagnosis not present

## 2016-06-01 MED ORDER — DULOXETINE HCL 30 MG PO CPEP: 30.0000 mg | ORAL_CAPSULE | Freq: Every day | ORAL | 3 refills | Status: DC

## 2016-06-01 NOTE — Telephone Encounter (Signed)
Seen today, not indicated, 3 negative paps

## 2016-06-01 NOTE — Patient Instructions (Addendum)
f/u in 3 month, call if you need me before  New for depression and pain is cymbalta.  You are referred to rheumatologist in [place] to help with generalized pain  Continue to show affection and love you both have  Health issues

## 2016-06-02 DIAGNOSIS — G894 Chronic pain syndrome: Secondary | ICD-10-CM | POA: Diagnosis not present

## 2016-06-02 DIAGNOSIS — Z79891 Long term (current) use of opiate analgesic: Secondary | ICD-10-CM | POA: Diagnosis not present

## 2016-06-02 DIAGNOSIS — M47812 Spondylosis without myelopathy or radiculopathy, cervical region: Secondary | ICD-10-CM | POA: Diagnosis not present

## 2016-06-02 DIAGNOSIS — M47816 Spondylosis without myelopathy or radiculopathy, lumbar region: Secondary | ICD-10-CM | POA: Diagnosis not present

## 2016-06-06 ENCOUNTER — Encounter: Payer: Self-pay | Admitting: Family Medicine

## 2016-06-06 NOTE — Assessment & Plan Note (Signed)
Controlled, no change in medication  

## 2016-06-06 NOTE — Assessment & Plan Note (Addendum)
Controlled, no change in management, diet and lifestyle controlled currently DASH diet and commitment to daily physical activity for a minimum of 30 minutes discussed and encouraged, as a part of hypertension management. The importance of attaining a healthy weight is also discussed.  BP/Weight 06/01/2016 04/29/2016 02/04/2016 01/20/2016 01/05/2016 10/26/2015 10/20/6857  Systolic BP 923 414 436 016 580 063 494  Diastolic BP 78 98 80 80 72 68 80  Wt. (Lbs) 121 115.2 122 119 121.8 121.6 120.8  BMI 20.14 19.17 20.3 19.8 20.27 20.24 20.1

## 2016-06-06 NOTE — Assessment & Plan Note (Signed)
c/o increased small joint pain and debility, requests re assessments of her disease and management, wishes to start with new rheumatologist , will refer

## 2016-06-06 NOTE — Assessment & Plan Note (Signed)
Sleep hygiene reviewed and written information offered also. Prescription sent for  medication needed.  

## 2016-06-06 NOTE — Progress Notes (Signed)
   Renee Waters     MRN: [id number]      DOB: 1945/02/15   HPI Renee Waters is here for follow up and re-evaluation of chronic medical conditions, medication management and review of any available recent lab and radiology data.  Preventive health is updated, specifically  Cancer screening and Immunization.   Questions or concerns regarding consultations or procedures which the PT has had in the interim are  addressed. The PT denies any adverse reactions to current medications since the last visit.  c/o increased depression and stress due to deteriorating health of spouse. c/o generalized small joint pains, has been diagnosed in the past with lupus and needs re evaluation for this and management, states she is willing to have this re addressed ROS Denies recent fever or chills. Denies sinus pressure, nasal congestion, ear pain or sore throat. Denies chest congestion, productive cough or wheezing. Denies chest pains, palpitations and leg swelling Denies abdominal pain, nausea, vomiting,diarrhea or constipation.   Denies dysuria, frequency, hesitancy or incontinence. Denies uncontrolled joint pain, swelling and limitation in mobility. Denies headaches, seizures, numbness, or tingling.  Denies skin break down or rash.   PE  BP 120/78   Pulse 87   Resp 16   Ht 5\' 5"  (1.651 m)   Wt 121 lb (54.9 kg)   SpO2 100%   BMI 20.14 kg/m   Patient alert and oriented and in no cardiopulmonary distress.  HEENT: No facial asymmetry, EOMI,   oropharynx pink and moist.  Neck supple no JVD, no mass.  Chest: Clear to auscultation bilaterally.  CVS: S1, S2 no murmurs, no S3.Regular rate.  ABD: Soft non tender.   Ext: No edema  MS: Adequate though reduced ROM spine, shoulders, hips and knees.  Skin: Intact, no ulcerations or rash noted.  Psych: Good eye contact, normal affect. Memory intact mildly  anxious tearful and  depressed appearing.  CNS: CN 2-12 intact, power,  normal throughout.no  focal deficits noted.   Assessment & Plan  Depression with anxiety Uncontrolled, resisting therapy , states will get support from family and friends, add cymbalta, f/u in 8 weeks  Essential hypertension Controlled, no change in management, diet and lifestyle controlled currently DASH diet and commitment to daily physical activity for a minimum of 30 minutes discussed and encouraged, as a part of hypertension management. The importance of attaining a healthy weight is also discussed.  BP/Weight 06/01/2016 04/29/2016 02/04/2016 01/20/2016 01/05/2016 10/26/2015 2/70/7867  Systolic BP 544 920 100 712 197 588 325  Diastolic BP 78 98 80 80 72 68 80  Wt. (Lbs) 121 115.2 122 119 121.8 121.6 120.8  BMI 20.14 19.17 20.3 19.8 20.27 20.24 20.1       Insomnia secondary to depression with anxiety Sleep hygiene reviewed and written information offered also. Prescription sent for  medication needed.   GERD (gastroesophageal reflux disease) Controlled, no change in medication   Systemic lupus (HCC) c/o increased small joint pain and debility, requests re assessments of her disease and management, wishes to start with new rheumatologist , will refer

## 2016-06-06 NOTE — Assessment & Plan Note (Signed)
Uncontrolled, resisting therapy , states will get support from family and friends, add cymbalta, f/u in 8 weeks

## 2016-06-14 ENCOUNTER — Encounter: Payer: Self-pay | Admitting: Internal Medicine

## 2016-06-16 NOTE — Telephone Encounter (Signed)
Tier exception has been denied. I have done the appeal and I have faxed it to [facility]. Waiting for response.

## 2016-06-17 ENCOUNTER — Other Ambulatory Visit: Payer: Self-pay | Admitting: Family Medicine

## 2016-06-17 ENCOUNTER — Telehealth: Payer: Self-pay

## 2016-06-17 MED ORDER — PREDNISONE 5 MG (21) PO TBPK: 5.0000 mg | ORAL_TABLET | ORAL | 0 refills | Status: DC

## 2016-06-17 NOTE — Telephone Encounter (Signed)
Wants to know if you can start her on a regime of prednisone until she is able to see rheumatologist. She states she is in so much pain. Please advise and send to pharmacy if you agree

## 2016-06-17 NOTE — Telephone Encounter (Signed)
Dose pack is sent in

## 2016-06-20 NOTE — Telephone Encounter (Signed)
Called and left message this was done

## 2016-07-01 DIAGNOSIS — M47816 Spondylosis without myelopathy or radiculopathy, lumbar region: Secondary | ICD-10-CM | POA: Diagnosis not present

## 2016-07-01 DIAGNOSIS — G894 Chronic pain syndrome: Secondary | ICD-10-CM | POA: Diagnosis not present

## 2016-07-01 DIAGNOSIS — Z79891 Long term (current) use of opiate analgesic: Secondary | ICD-10-CM | POA: Diagnosis not present

## 2016-07-01 DIAGNOSIS — M47812 Spondylosis without myelopathy or radiculopathy, cervical region: Secondary | ICD-10-CM | POA: Diagnosis not present

## 2016-07-05 ENCOUNTER — Encounter: Payer: Self-pay | Admitting: Gastroenterology

## 2016-07-05 NOTE — Progress Notes (Signed)
Letter done for tier exception on Linz[employer].

## 2016-07-19 ENCOUNTER — Other Ambulatory Visit: Payer: Self-pay | Admitting: Family Medicine

## 2016-07-19 ENCOUNTER — Other Ambulatory Visit: Payer: Self-pay | Admitting: Orthopedic Surgery

## 2016-07-19 DIAGNOSIS — M1712 Unilateral primary osteoarthritis, left knee: Secondary | ICD-10-CM

## 2016-07-21 ENCOUNTER — Other Ambulatory Visit: Payer: Self-pay | Admitting: Family Medicine

## 2016-07-22 NOTE — Telephone Encounter (Signed)
Patient called today checking on refill for her medications. Please advise

## 2016-07-22 NOTE — Telephone Encounter (Signed)
Patients husband left a msg on nurse line requesting for her muscle relaxer to be doubled, says she needs one in the am & pm due to working outside a lot more.  Cb#: [phone]

## 2016-07-26 ENCOUNTER — Telehealth: Payer: Self-pay | Admitting: *Deleted

## 2016-07-26 NOTE — Telephone Encounter (Signed)
Patient is requesting tizanidine 4mg  1x daily to be changed to twice daily patient states she does a lot of driving during the day and she needs it for her body. I made patient aware Dr Moshe Cipro will be back in the office Monday. [phone]

## 2016-07-29 DIAGNOSIS — Z79891 Long term (current) use of opiate analgesic: Secondary | ICD-10-CM | POA: Diagnosis not present

## 2016-07-29 DIAGNOSIS — M47816 Spondylosis without myelopathy or radiculopathy, lumbar region: Secondary | ICD-10-CM | POA: Diagnosis not present

## 2016-07-29 DIAGNOSIS — G894 Chronic pain syndrome: Secondary | ICD-10-CM | POA: Diagnosis not present

## 2016-07-29 DIAGNOSIS — M47812 Spondylosis without myelopathy or radiculopathy, cervical region: Secondary | ICD-10-CM | POA: Diagnosis not present

## 2016-08-01 ENCOUNTER — Other Ambulatory Visit: Payer: Self-pay | Admitting: Family Medicine

## 2016-08-01 NOTE — Telephone Encounter (Signed)
Requesting an increase in her tizanidine to bid due to increased pain and its making her need more of her medication. Was aware that you were out of the office until this week

## 2016-08-02 ENCOUNTER — Other Ambulatory Visit: Payer: Self-pay | Admitting: Family Medicine

## 2016-08-02 MED ORDER — TIZANIDINE HCL 4 MG PO TABS: ORAL_TABLET | ORAL | 5 refills | Status: DC

## 2016-08-02 NOTE — Telephone Encounter (Signed)
Dose increased and printed, pls fax and let her know

## 2016-08-03 NOTE — Telephone Encounter (Signed)
Seen 4 25 18 

## 2016-08-03 NOTE — Telephone Encounter (Signed)
Message left that medication is sent so just send pls

## 2016-08-08 DIAGNOSIS — M150 Primary generalized (osteo)arthritis: Secondary | ICD-10-CM | POA: Diagnosis not present

## 2016-08-08 DIAGNOSIS — Z6820 Body mass index (BMI) 20.0-20.9, adult: Secondary | ICD-10-CM | POA: Diagnosis not present

## 2016-08-08 DIAGNOSIS — R5383 Other fatigue: Secondary | ICD-10-CM | POA: Diagnosis not present

## 2016-08-08 DIAGNOSIS — M791 Myalgia: Secondary | ICD-10-CM | POA: Diagnosis not present

## 2016-08-08 DIAGNOSIS — R768 Other specified abnormal immunological findings in serum: Secondary | ICD-10-CM | POA: Diagnosis not present

## 2016-08-08 DIAGNOSIS — M545 Low back pain: Secondary | ICD-10-CM | POA: Diagnosis not present

## 2016-08-26 DIAGNOSIS — G894 Chronic pain syndrome: Secondary | ICD-10-CM | POA: Diagnosis not present

## 2016-08-26 DIAGNOSIS — M47816 Spondylosis without myelopathy or radiculopathy, lumbar region: Secondary | ICD-10-CM | POA: Diagnosis not present

## 2016-08-26 DIAGNOSIS — Z79891 Long term (current) use of opiate analgesic: Secondary | ICD-10-CM | POA: Diagnosis not present

## 2016-08-26 DIAGNOSIS — M47812 Spondylosis without myelopathy or radiculopathy, cervical region: Secondary | ICD-10-CM | POA: Diagnosis not present

## 2016-08-31 ENCOUNTER — Encounter: Payer: Self-pay | Admitting: Family Medicine

## 2016-08-31 ENCOUNTER — Ambulatory Visit (INDEPENDENT_AMBULATORY_CARE_PROVIDER_SITE_OTHER): Payer: Medicare Other | Admitting: Family Medicine

## 2016-08-31 VITALS — BP 120/84 | HR 58 | Temp 97.5°F | Resp 14 | Ht 65.0 in | Wt 120.5 lb

## 2016-08-31 DIAGNOSIS — M546 Pain in thoracic spine: Secondary | ICD-10-CM | POA: Diagnosis not present

## 2016-08-31 DIAGNOSIS — I10 Essential (primary) hypertension: Secondary | ICD-10-CM

## 2016-08-31 DIAGNOSIS — M150 Primary generalized (osteo)arthritis: Secondary | ICD-10-CM | POA: Diagnosis not present

## 2016-08-31 DIAGNOSIS — M159 Polyosteoarthritis, unspecified: Secondary | ICD-10-CM

## 2016-08-31 DIAGNOSIS — F411 Generalized anxiety disorder: Secondary | ICD-10-CM | POA: Diagnosis not present

## 2016-08-31 DIAGNOSIS — F418 Other specified anxiety disorders: Secondary | ICD-10-CM | POA: Diagnosis not present

## 2016-08-31 NOTE — Progress Notes (Signed)
   Renee Waters     MRN: [id number]      DOB: 22-Aug-1945   HPI Ms. Demonbreun is here for follow up and re-evaluation of chronic medical conditions, medication management and review of any available recent lab and radiology data.  Preventive health is updated, specifically  Cancer screening and Immunization.   Questions or concerns regarding consultations or procedures which the PT has had in the interim are  addressed. The PT denies any adverse reactions to current medications since the last visit.  Stressed and overwhelmed with family stress and recent death of her cousin Relationship with her spouse is at rock bottom, "affecting her children", now decides that she needs therapy, not suicidal or homicidal  Happy and satisfied with rheumatologist and pain specialist   ROS: Denies recent fever or chills. Denies sinus pressure, nasal congestion, ear pain or sore throat. Denies chest congestion, productive cough or wheezing. Denies chest pains, palpitations and leg swelling Denies abdominal pain, nausea, vomiting,diarrhea or constipation.   Denies dysuria, frequency, hesitancy or incontinence. Denies uncontrolled  joint pain, swelling and limitation in mobility. Denies headaches, seizures, numbness, or tingling.  Denies skin break down or rash.   PE  BP 120/84 (BP Location: Right Arm, Patient Position: Sitting, Cuff Size: Normal)   Pulse (!) 58   Temp (!) 97.5 F (36.4 C) (Other (Comment))   Resp 14   Ht 5\' 5"  (1.651 m)   Wt 120 lb 8 oz (54.7 kg)   SpO2 95%   BMI 20.05 kg/m   Patient alert and oriented and in no cardiopulmonary distress.  HEENT: No facial asymmetry, EOMI,   oropharynx pink and moist.  Neck supple no JVD, no mass.  Chest: Clear to auscultation bilaterally.  CVS: S1, S2 no murmurs, no S3.Regular rate.  ABD: Soft non tender.   Ext: No edema  MS: decreased  ROM spine, shoulders, hips and knees.  Skin: Intact, no ulcerations or rash noted.  Psych: Good  eye contact, flat  affect. Memory intact tearful, anxious and  depressed appearing.  CNS: CN 2-12 intact, power,  normal throughout.no focal deficits noted.   Assessment & Plan  Thoracic spine pain 2 week h./o burning pain in thoracic spine  Depression with anxiety Inadequately treated, refer to psychiatry , telehealth services to start next week, pt aware that she will be contacted and streamlined to local mental health resources, psychiatry and therapy, states she is now ready for help No change in cymblata dose of 30 mg daily will let psychiatry address mental health needs  GAD (generalized anxiety disorder) Uncontrolled, refer to psychiatry through telehealth  Essential hypertension Controlled, no change in medication   Osteoarthritis Chronic pain management through pain clinic

## 2016-08-31 NOTE — Patient Instructions (Signed)
F/u in 3 months, call if you need me sooner..   You are being referred to psychiatry and therapy , you will be contacted next week  Blopod pressure is good  Thankful that you are happy with yopu doctors  All the best , and enjoy family gatherings tha are celebrations  Condolence regarding recent passing  .Thank you  for choosing [facility]. We consider it a privelige to serve you.  Delivering excellent health care in a caring and  compassionate way is our goal.  Partnering with you,  so that together we can achieve this goal is our strategy.

## 2016-08-31 NOTE — Assessment & Plan Note (Signed)
2 week h./o burning pain in thoracic spine

## 2016-09-01 NOTE — Assessment & Plan Note (Signed)
Uncontrolled, refer to psychiatry through telehealth

## 2016-09-01 NOTE — Assessment & Plan Note (Signed)
Chronic pain management through pain clinic ?

## 2016-09-01 NOTE — Assessment & Plan Note (Signed)
Inadequately treated, refer to psychiatry , telehealth services to start next week, pt aware that she will be contacted and streamlined to local mental health resources, psychiatry and therapy, states she is now ready for help No change in cymblata dose of 30 mg daily will let psychiatry address mental health needs

## 2016-09-01 NOTE — Assessment & Plan Note (Signed)
Controlled, no change in medication  

## 2016-09-08 ENCOUNTER — Other Ambulatory Visit: Payer: Self-pay | Admitting: Family Medicine

## 2016-09-08 NOTE — Telephone Encounter (Signed)
Patient called nurse line. She states that [employer] told her valsartan has been recalled. She would like medications to be switched. She also wants temazepam re sent. She states she went on vacation and allowed the rx to expire.

## 2016-09-09 ENCOUNTER — Other Ambulatory Visit: Payer: Self-pay | Admitting: Family Medicine

## 2016-09-09 ENCOUNTER — Other Ambulatory Visit: Payer: Self-pay

## 2016-09-09 ENCOUNTER — Telehealth: Payer: Self-pay | Admitting: Family Medicine

## 2016-09-09 MED ORDER — TEMAZEPAM 30 MG PO CAPS: 30.0000 mg | ORAL_CAPSULE | Freq: Every evening | ORAL | 4 refills | Status: DC | PRN

## 2016-09-09 MED ORDER — OLMESARTAN MEDOXOMIL 20 MG PO TABS: 20.0000 mg | ORAL_TABLET | Freq: Every day | ORAL | 5 refills | Status: DC

## 2016-09-09 NOTE — Telephone Encounter (Signed)
I have printed benicar 20 mg in place of diovan 80 mg and restoril for patient , ps fax both , I am going to have to call in the restoril I know, let the pt know also pls , a messages was sent yesterday to me through rx refills from [facility], thanks!

## 2016-09-19 ENCOUNTER — Other Ambulatory Visit: Payer: Self-pay | Admitting: Family Medicine

## 2016-09-20 ENCOUNTER — Telehealth: Payer: Self-pay | Admitting: Family Medicine

## 2016-09-20 NOTE — Telephone Encounter (Signed)
[facility] called regarding patient, left message. Need approval to fill alprazolam, patient is on oxycodone 10 mg #120 from another provider.

## 2016-09-21 NOTE — Telephone Encounter (Signed)
[facility] calling again to verify that Dr. Moshe Cipro is aware that patient is receiving oxycodone from Dr. Hardin Negus in [place], and gives the okay for patient to continue with Alprazolam and Temazepam

## 2016-09-21 NOTE — Telephone Encounter (Signed)
Informed.

## 2016-09-21 NOTE — Telephone Encounter (Signed)
pls verify that pt may have the xanax filled

## 2016-09-26 ENCOUNTER — Telehealth (HOSPITAL_COMMUNITY): Payer: Self-pay

## 2016-09-26 DIAGNOSIS — G894 Chronic pain syndrome: Secondary | ICD-10-CM | POA: Diagnosis not present

## 2016-09-26 DIAGNOSIS — M47812 Spondylosis without myelopathy or radiculopathy, cervical region: Secondary | ICD-10-CM | POA: Diagnosis not present

## 2016-09-26 DIAGNOSIS — M47816 Spondylosis without myelopathy or radiculopathy, lumbar region: Secondary | ICD-10-CM | POA: Diagnosis not present

## 2016-09-26 DIAGNOSIS — Z79891 Long term (current) use of opiate analgesic: Secondary | ICD-10-CM | POA: Diagnosis not present

## 2016-10-04 ENCOUNTER — Other Ambulatory Visit: Payer: Self-pay | Admitting: Family Medicine

## 2016-10-07 ENCOUNTER — Other Ambulatory Visit: Payer: Self-pay | Admitting: Family Medicine

## 2016-10-11 ENCOUNTER — Telehealth: Payer: Self-pay | Admitting: Family Medicine

## 2016-10-11 NOTE — Telephone Encounter (Signed)
Error

## 2016-10-23 ENCOUNTER — Other Ambulatory Visit: Payer: Self-pay | Admitting: Family Medicine

## 2016-10-24 DIAGNOSIS — M4155 Other secondary scoliosis, thoracolumbar region: Secondary | ICD-10-CM | POA: Diagnosis not present

## 2016-10-24 DIAGNOSIS — G894 Chronic pain syndrome: Secondary | ICD-10-CM | POA: Diagnosis not present

## 2016-10-24 DIAGNOSIS — Z79891 Long term (current) use of opiate analgesic: Secondary | ICD-10-CM | POA: Diagnosis not present

## 2016-10-24 DIAGNOSIS — M47816 Spondylosis without myelopathy or radiculopathy, lumbar region: Secondary | ICD-10-CM | POA: Diagnosis not present

## 2016-10-24 DIAGNOSIS — M150 Primary generalized (osteo)arthritis: Secondary | ICD-10-CM | POA: Diagnosis not present

## 2016-10-24 DIAGNOSIS — M47812 Spondylosis without myelopathy or radiculopathy, cervical region: Secondary | ICD-10-CM | POA: Diagnosis not present

## 2016-10-24 DIAGNOSIS — M62830 Muscle spasm of back: Secondary | ICD-10-CM | POA: Diagnosis not present

## 2016-10-24 DIAGNOSIS — K5900 Constipation, unspecified: Secondary | ICD-10-CM | POA: Diagnosis not present
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
## 2016-10-28 ENCOUNTER — Ambulatory Visit (INDEPENDENT_AMBULATORY_CARE_PROVIDER_SITE_OTHER): Payer: Medicare Other | Admitting: Psychiatry

## 2016-10-28 ENCOUNTER — Encounter (INDEPENDENT_AMBULATORY_CARE_PROVIDER_SITE_OTHER): Payer: Self-pay

## 2016-10-28 VITALS — BP 120/60 | Ht 63.39 in | Wt 121.0 lb

## 2016-10-28 DIAGNOSIS — G8929 Other chronic pain: Secondary | ICD-10-CM

## 2016-10-28 DIAGNOSIS — M1990 Unspecified osteoarthritis, unspecified site: Secondary | ICD-10-CM

## 2016-10-28 DIAGNOSIS — G4700 Insomnia, unspecified: Secondary | ICD-10-CM

## 2016-10-28 DIAGNOSIS — I10 Essential (primary) hypertension: Secondary | ICD-10-CM | POA: Diagnosis not present

## 2016-10-28 DIAGNOSIS — Z79899 Other long term (current) drug therapy: Secondary | ICD-10-CM

## 2016-10-28 DIAGNOSIS — Z87891 Personal history of nicotine dependence: Secondary | ICD-10-CM

## 2016-10-28 DIAGNOSIS — F331 Major depressive disorder, recurrent, moderate: Secondary | ICD-10-CM | POA: Diagnosis not present

## 2016-10-28 DIAGNOSIS — F419 Anxiety disorder, unspecified: Secondary | ICD-10-CM | POA: Diagnosis not present

## 2016-10-28 MED ORDER — ESCITALOPRAM OXALATE 20 MG PO TABS: ORAL_TABLET | ORAL | 1 refills | Status: DC

## 2016-10-28 NOTE — Patient Instructions (Signed)
1. Discontinue duloxetine 2. Start lexapro 10 mg daily for two weeks, then 20 mg daily 3. Referral to therapy 4. Return to clinic in one month for 30 mins

## 2016-10-30 ENCOUNTER — Other Ambulatory Visit: Payer: Self-pay | Admitting: Family Medicine

## 2016-10-30 DIAGNOSIS — G44021 Chronic cluster headache, intractable: Secondary | ICD-10-CM

## 2016-11-21 DIAGNOSIS — M47816 Spondylosis without myelopathy or radiculopathy, lumbar region: Secondary | ICD-10-CM | POA: Diagnosis not present

## 2016-11-21 DIAGNOSIS — M47812 Spondylosis without myelopathy or radiculopathy, cervical region: Secondary | ICD-10-CM | POA: Diagnosis not present

## 2016-11-21 DIAGNOSIS — G894 Chronic pain syndrome: Secondary | ICD-10-CM | POA: Diagnosis not present

## 2016-11-21 DIAGNOSIS — Z79891 Long term (current) use of opiate analgesic: Secondary | ICD-10-CM | POA: Diagnosis not present
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
## 2016-11-24 ENCOUNTER — Ambulatory Visit (INDEPENDENT_AMBULATORY_CARE_PROVIDER_SITE_OTHER): Payer: Medicare Other

## 2016-11-24 ENCOUNTER — Ambulatory Visit (INDEPENDENT_AMBULATORY_CARE_PROVIDER_SITE_OTHER): Payer: Medicare Other | Admitting: Psychiatry

## 2016-11-24 ENCOUNTER — Other Ambulatory Visit: Payer: Self-pay | Admitting: Family Medicine

## 2016-11-24 VITALS — BP 199/95 | HR 65 | Ht 65.0 in | Wt 120.0 lb

## 2016-11-24 DIAGNOSIS — G8929 Other chronic pain: Secondary | ICD-10-CM

## 2016-11-24 DIAGNOSIS — Z87891 Personal history of nicotine dependence: Secondary | ICD-10-CM | POA: Diagnosis not present

## 2016-11-24 DIAGNOSIS — Z23 Encounter for immunization: Secondary | ICD-10-CM

## 2016-11-24 DIAGNOSIS — M1990 Unspecified osteoarthritis, unspecified site: Secondary | ICD-10-CM | POA: Diagnosis not present

## 2016-11-24 DIAGNOSIS — G4700 Insomnia, unspecified: Secondary | ICD-10-CM | POA: Diagnosis not present

## 2016-11-24 DIAGNOSIS — Z91410 Personal history of adult physical and sexual abuse: Secondary | ICD-10-CM | POA: Diagnosis not present

## 2016-11-24 DIAGNOSIS — F331 Major depressive disorder, recurrent, moderate: Secondary | ICD-10-CM | POA: Diagnosis not present

## 2016-11-24 DIAGNOSIS — Z79899 Other long term (current) drug therapy: Secondary | ICD-10-CM

## 2016-11-24 DIAGNOSIS — F419 Anxiety disorder, unspecified: Secondary | ICD-10-CM

## 2016-11-24 DIAGNOSIS — I10 Essential (primary) hypertension: Secondary | ICD-10-CM

## 2016-11-24 MED ORDER — MIRTAZAPINE 15 MG PO TABS: ORAL_TABLET | ORAL | 1 refills | Status: DC

## 2016-11-24 MED ORDER — OLMESARTAN MEDOXOMIL 40 MG PO TABS: 40.0000 mg | ORAL_TABLET | Freq: Every day | ORAL | 2 refills | Status: DC

## 2016-11-24 NOTE — Patient Instructions (Addendum)
1. Start mirtazapine 7.5 mg at night for one week, then 15 mg at night 2. Decrease lexapro 10 mg daily for one week, then discontinue 3. Referral to therapy  4. Return to clinic in one month for 30 mins

## 2016-11-24 NOTE — Progress Notes (Signed)
Patient came in for BP check, 190/90 first time, 194/90 second time. Double checked by Clinton Quant

## 2016-11-24 NOTE — Progress Notes (Unsigned)
benicar

## 2016-11-29 ENCOUNTER — Other Ambulatory Visit: Payer: Self-pay | Admitting: Family Medicine

## 2016-11-30 ENCOUNTER — Telehealth (HOSPITAL_COMMUNITY): Payer: Self-pay | Admitting: *Deleted

## 2016-11-30 NOTE — Telephone Encounter (Signed)
Pt pharmacy [facility] in [place] requesting 90 days supply for pt Escitalopram 20 mg QD. Per pt chart, pt medication was stopped on 11-24-2016 due to change in therapy.

## 2016-12-07 ENCOUNTER — Ambulatory Visit (INDEPENDENT_AMBULATORY_CARE_PROVIDER_SITE_OTHER): Payer: Medicare Other | Admitting: Family Medicine

## 2016-12-07 ENCOUNTER — Encounter: Payer: Self-pay | Admitting: Family Medicine

## 2016-12-07 VITALS — BP 148/82 | HR 78 | Resp 16 | Ht 65.0 in | Wt 118.0 lb

## 2016-12-07 DIAGNOSIS — E559 Vitamin D deficiency, unspecified: Secondary | ICD-10-CM

## 2016-12-07 DIAGNOSIS — I10 Essential (primary) hypertension: Secondary | ICD-10-CM | POA: Diagnosis not present

## 2016-12-07 DIAGNOSIS — Z1231 Encounter for screening mammogram for malignant neoplasm of breast: Secondary | ICD-10-CM

## 2016-12-07 DIAGNOSIS — F331 Major depressive disorder, recurrent, moderate: Secondary | ICD-10-CM | POA: Diagnosis not present

## 2016-12-07 DIAGNOSIS — M542 Cervicalgia: Secondary | ICD-10-CM

## 2016-12-07 MED ORDER — KETOROLAC TROMETHAMINE 60 MG/2ML IM SOLN
60.0000 mg | Freq: Once | INTRAMUSCULAR | Status: AC
  Administered 2016-12-07: 60 mg via INTRAMUSCULAR

## 2016-12-07 MED ORDER — METHYLPREDNISOLONE ACETATE 80 MG/ML IJ SUSP
80.0000 mg | Freq: Once | INTRAMUSCULAR | Status: AC
  Administered 2016-12-07: 80 mg via INTRAMUSCULAR

## 2016-12-07 MED ORDER — VALSARTAN 160 MG PO TABS: 160.0000 mg | ORAL_TABLET | Freq: Every day | ORAL | 4 refills | Status: DC

## 2016-12-07 NOTE — Progress Notes (Signed)
   Renee Waters     MRN: [id number]      DOB: [birth year]   HPI Renee Waters is here for follow up and re-evaluation of chronic medical conditions, medication management and review of any available recent lab and radiology data.  Preventive health is updated, specifically  Cancer screening and Immunization.   Questions or concerns regarding consultations or procedures which the PT has had in the interim are  addressed. C/o hair loss with benicar that she did not have with diovan so she wants to switch back to diovan if possible, the hair loss is more likely due to the fact that she had her hair in braids  C/o swelling on right leg and left leg no tender , x 2 years, concerned, this is vein assured her of this C/o pain in multiple joints , concerned she has had no  xrays in ove 1 year and is being treated wants imaging done, states wrisits and hands hurt all the time, cant open hands in the morning, neck and back hurt all day, up all night lives with 9 pian , in her garden working  Through the pain  But not happy  Requests injections today for generalized pain especially in her neck Has started going to psychiatry and is giving this some time to have benefit  ROS Denies recent fever or chills. Denies sinus pressure, nasal congestion, ear pain or sore throat. Denies chest congestion, productive cough or wheezing. Denies chest pains, palpitations and leg swelling Denies abdominal pain, nausea, vomiting,diarrhea or constipation.   Denies dysuria, frequency, hesitancy or incontinence.  Denies headaches, seizures,  . Denies skin break down or rash.   PE  BP 122/72   Pulse 78   Resp 16   Ht 5\' 5"  (1.651 m)   Wt 118 lb (53.5 kg)   SpO2 97%   BMI 19.64 kg/m   Patient alert and oriented and in no cardiopulmonary distress.  HEENT: No facial asymmetry, EOMI,   oropharynx pink and moist.  Neck decreased ROM no JVD, no mass.  Chest: Clear to auscultation bilaterally.  CVS: S1, S2 no  murmurs, no S3.Regular rate.  ABD: Soft non tender.   Ext: No edema swollen varicose vein in area of concern on lower extremioties  PJ:[id number] quate ROM spine, shoulders, hips and knees.  Skin: Intact, no ulcerations or rash noted.  Psych: Good eye contact, normal affect. Memory intact not anxious or depressed appearing.  CNS: CN 2-12 intact, power,  normal throughout.no focal deficits noted.   Assessment & Plan  Essential hypertension Controlled , but patient reports hair loss with benicar , will switch to diovan 160 mg daily  MDD (major depressive disorder), recurrent episode, moderate ([place]) Being treated by psychiatry an I expect improvement as a result, some evidence already thar she is more calm  NECK PAIN, CHRONIC Uncontrolled.Toradol and depo medrol administered IM in the office

## 2016-12-07 NOTE — Patient Instructions (Addendum)
F/u in mid January, call if you need me sooner  CBc, fasting lipid, cmp and EGFr, tSH and vit D December 14 or after  Mammogram due in January please schedule at checkout  Please discuss request for X rays with your rheumatologist  Diovan 160 mg ( Valsartan ) is sent in place of benicar for blood pressure  Toradol 60 mg IM and depomedriol 80 mg iM for pain Happy 71 in 2 weeks !!1  Thank you  for choosing [facility]. We consider it a privelige to serve you.  Delivering excellent health care in a caring and  compassionate way is our goal.  Partnering with you,  so that together we can achieve this goal is our strategy.

## 2016-12-08 LAB — COMPREHENSIVE METABOLIC PANEL
A/G RATIO: 1.2 (ref 1.2–2.2)
ALK PHOS: 110 IU/L (ref 39–117)
ALT: 15 IU/L (ref 0–32)
AST: 20 IU/L (ref 0–40)
Albumin: 4.2 g/dL (ref 3.5–4.8)
BILIRUBIN TOTAL: 0.3 mg/dL (ref 0.0–1.2)
BUN/Creatinine Ratio: 18 (ref 12–28)
BUN: 16 mg/dL (ref 8–27)
CHLORIDE: 103 mmol/L (ref 96–106)
CO2: 20 mmol/L (ref 20–29)
Calcium: 9.1 mg/dL (ref 8.7–10.3)
Creatinine, Ser: 0.9 mg/dL (ref 0.57–1.00)
GFR calc non Af Amer: 65 mL/min/{1.73_m2} (ref >59)
GFR, EST AFRICAN AMERICAN: 75 mL/min/{1.73_m2} (ref >59)
GLUCOSE: 86 mg/dL (ref 65–99)
Globulin, Total: 3.4 g/dL (ref 1.5–4.5)
POTASSIUM: 4.1 mmol/L (ref 3.5–5.2)
Sodium: 137 mmol/L (ref 134–144)
TOTAL PROTEIN: 7.6 g/dL (ref 6.0–8.5)

## 2016-12-08 LAB — CBC
Hematocrit: 35.4 % (ref 34.0–46.6)
Hemoglobin: 11.6 g/dL (ref 11.1–15.9)
MCH: 28.9 pg (ref 26.6–33.0)
MCHC: 32.8 g/dL (ref 31.5–35.7)
MCV: 88 fL (ref 79–97)
PLATELETS: 217 10*3/uL (ref 150–379)
RBC: 4.01 x10E6/uL (ref 3.77–5.28)
RDW: 15.9 % — AB (ref 12.3–15.4)
WBC: 4.3 10*3/uL (ref 3.4–10.8)

## 2016-12-08 LAB — LIPID PANEL
CHOLESTEROL TOTAL: 127 mg/dL (ref 100–199)
Chol/HDL Ratio: 2.4 ratio (ref 0.0–4.4)
HDL: 53 mg/dL (ref >39)
LDL CALC: 62 mg/dL (ref 0–99)
TRIGLYCERIDES: 59 mg/dL (ref 0–149)
VLDL CHOLESTEROL CAL: 12 mg/dL (ref 5–40)

## 2016-12-08 LAB — TSH: TSH: 1.12 u[IU]/mL (ref 0.450–4.500)

## 2016-12-08 LAB — VITAMIN D 25 HYDROXY (VIT D DEFICIENCY, FRACTURES): Vit D, 25-Hydroxy: 75.2 ng/mL (ref 30.0–100.0)

## 2016-12-09 ENCOUNTER — Other Ambulatory Visit: Payer: Self-pay | Admitting: Family Medicine

## 2016-12-11 ENCOUNTER — Encounter: Payer: Self-pay | Admitting: Family Medicine

## 2016-12-11 NOTE — Assessment & Plan Note (Signed)
Uncontrolled.Toradol and depo medrol administered IM in the office . 

## 2016-12-11 NOTE — Assessment & Plan Note (Signed)
Controlled , but patient reports hair loss with benicar , will switch to diovan 160 mg daily

## 2016-12-11 NOTE — Assessment & Plan Note (Signed)
Being treated by psychiatry an I expect improvement as a result, some evidence already thar she is more calm

## 2016-12-14 ENCOUNTER — Other Ambulatory Visit: Payer: Self-pay | Admitting: Family Medicine

## 2016-12-14 ENCOUNTER — Telehealth: Payer: Self-pay | Admitting: Family Medicine

## 2016-12-14 MED ORDER — LOSARTAN POTASSIUM 50 MG PO TABS: 50.0000 mg | ORAL_TABLET | Freq: Every day | ORAL | 3 refills | Status: DC

## 2016-12-14 NOTE — Telephone Encounter (Signed)
I have pronted losartan 50 mg instead of the divan one daily please let her know and send to her pharmacy

## 2016-12-14 NOTE — Telephone Encounter (Signed)
Patient left message on nurse line stating that her BP med, Benicar, is still discontinued. She wants to know if another medication can be called in.  Callback# [phone]

## 2016-12-14 NOTE — Telephone Encounter (Signed)
Patient aware.

## 2016-12-15 ENCOUNTER — Ambulatory Visit (INDEPENDENT_AMBULATORY_CARE_PROVIDER_SITE_OTHER): Payer: Medicare Other | Admitting: Licensed Clinical Social Worker

## 2016-12-15 ENCOUNTER — Encounter (HOSPITAL_COMMUNITY): Payer: Self-pay | Admitting: Licensed Clinical Social Worker

## 2016-12-15 DIAGNOSIS — F331 Major depressive disorder, recurrent, moderate: Secondary | ICD-10-CM | POA: Diagnosis not present
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
## 2016-12-16 ENCOUNTER — Encounter (HOSPITAL_COMMUNITY): Payer: Self-pay | Admitting: *Deleted
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
## 2016-12-19 DIAGNOSIS — M47812 Spondylosis without myelopathy or radiculopathy, cervical region: Secondary | ICD-10-CM | POA: Diagnosis not present

## 2016-12-19 DIAGNOSIS — G894 Chronic pain syndrome: Secondary | ICD-10-CM | POA: Diagnosis not present

## 2016-12-19 DIAGNOSIS — Z79891 Long term (current) use of opiate analgesic: Secondary | ICD-10-CM | POA: Diagnosis not present

## 2016-12-19 DIAGNOSIS — M47816 Spondylosis without myelopathy or radiculopathy, lumbar region: Secondary | ICD-10-CM | POA: Diagnosis not present

## 2016-12-20 ENCOUNTER — Telehealth: Payer: Self-pay | Admitting: Family Medicine

## 2016-12-20 NOTE — Telephone Encounter (Signed)
Patient left message on nurse line regarding blood pressure medication. She states the new medication she was put on is also being recalled. She states it is a different lot number but she is u nsure if she should still take the medication as the recall was for cancer. Please call back  [phone]

## 2016-12-20 NOTE — Telephone Encounter (Signed)
pls call pharmacy and see if this is correct, if so what ARB's are NOT recalled

## 2016-12-20 NOTE — Telephone Encounter (Signed)
States the 2nd med you called in is recalled as well.

## 2016-12-21 NOTE — Telephone Encounter (Signed)
Med not listed under the recall. Confirmed with pharmacy and they will get med ready for patient

## 2016-12-22 ENCOUNTER — Encounter (HOSPITAL_COMMUNITY): Payer: Self-pay | Admitting: Psychiatry

## 2016-12-22 ENCOUNTER — Ambulatory Visit (INDEPENDENT_AMBULATORY_CARE_PROVIDER_SITE_OTHER): Payer: Medicare Other | Admitting: Psychiatry

## 2016-12-22 VITALS — BP 124/82 | HR 76 | Ht 65.0 in | Wt 123.0 lb

## 2016-12-22 DIAGNOSIS — G4700 Insomnia, unspecified: Secondary | ICD-10-CM | POA: Diagnosis not present

## 2016-12-22 DIAGNOSIS — M1990 Unspecified osteoarthritis, unspecified site: Secondary | ICD-10-CM | POA: Diagnosis not present

## 2016-12-22 DIAGNOSIS — F419 Anxiety disorder, unspecified: Secondary | ICD-10-CM | POA: Diagnosis not present

## 2016-12-22 DIAGNOSIS — I10 Essential (primary) hypertension: Secondary | ICD-10-CM

## 2016-12-22 DIAGNOSIS — G8929 Other chronic pain: Secondary | ICD-10-CM | POA: Diagnosis not present

## 2016-12-22 DIAGNOSIS — Z87891 Personal history of nicotine dependence: Secondary | ICD-10-CM

## 2016-12-22 DIAGNOSIS — R450 Nervousness: Secondary | ICD-10-CM | POA: Diagnosis not present

## 2016-12-22 DIAGNOSIS — F331 Major depressive disorder, recurrent, moderate: Secondary | ICD-10-CM

## 2016-12-22 MED ORDER — MIRTAZAPINE 30 MG PO TABS: 30.0000 mg | ORAL_TABLET | Freq: Every day | ORAL | 0 refills | Status: DC

## 2016-12-22 NOTE — Patient Instructions (Signed)
1. Increase mirtazapine 30 mg at night 2. Return to clinic in one month for 15 mins

## 2016-12-26 ENCOUNTER — Telehealth (HOSPITAL_COMMUNITY): Payer: Self-pay | Admitting: *Deleted

## 2016-12-26 NOTE — Telephone Encounter (Signed)
phone call, Dec. 4 appointment canceled due to provider out of office.  patient's husband said he will have her call to reschedule.

## 2017-01-04 ENCOUNTER — Ambulatory Visit: Payer: Medicare Other

## 2017-01-04 ENCOUNTER — Other Ambulatory Visit: Payer: Self-pay | Admitting: Family Medicine

## 2017-01-08 ENCOUNTER — Other Ambulatory Visit: Payer: Self-pay | Admitting: Family Medicine

## 2017-01-10 ENCOUNTER — Ambulatory Visit (HOSPITAL_COMMUNITY): Payer: Self-pay | Admitting: Licensed Clinical Social Worker

## 2017-01-10 ENCOUNTER — Encounter: Payer: Self-pay | Admitting: Family Medicine

## 2017-01-17 ENCOUNTER — Ambulatory Visit (HOSPITAL_COMMUNITY): Payer: Self-pay | Admitting: Licensed Clinical Social Worker
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
## 2017-01-23 ENCOUNTER — Ambulatory Visit (INDEPENDENT_AMBULATORY_CARE_PROVIDER_SITE_OTHER): Payer: Medicare Other | Admitting: Psychiatry

## 2017-01-23 ENCOUNTER — Encounter (HOSPITAL_COMMUNITY): Payer: Self-pay | Admitting: Psychiatry

## 2017-01-23 VITALS — BP 124/78 | Ht 65.0 in | Wt 118.0 lb

## 2017-01-23 DIAGNOSIS — F331 Major depressive disorder, recurrent, moderate: Secondary | ICD-10-CM | POA: Diagnosis not present

## 2017-01-23 DIAGNOSIS — F419 Anxiety disorder, unspecified: Secondary | ICD-10-CM

## 2017-01-23 DIAGNOSIS — G4700 Insomnia, unspecified: Secondary | ICD-10-CM

## 2017-01-23 DIAGNOSIS — Z87891 Personal history of nicotine dependence: Secondary | ICD-10-CM

## 2017-01-23 DIAGNOSIS — R450 Nervousness: Secondary | ICD-10-CM

## 2017-01-23 MED ORDER — MIRTAZAPINE 30 MG PO TABS: 30.0000 mg | ORAL_TABLET | Freq: Every day | ORAL | 0 refills | Status: DC

## 2017-01-23 NOTE — Patient Instructions (Signed)
1. Continue mirtazapine 30 mg at night 2. Return to clinic in one month for 30 mins

## 2017-02-08 ENCOUNTER — Other Ambulatory Visit: Payer: Self-pay

## 2017-02-08 ENCOUNTER — Ambulatory Visit: Payer: Medicare Other

## 2017-02-08 VITALS — BP 154/86 | HR 80 | Temp 98.3°F | Resp 16 | Ht 65.0 in | Wt 119.0 lb

## 2017-02-08 DIAGNOSIS — Z0000 Encounter for general adult medical examination without abnormal findings: Secondary | ICD-10-CM

## 2017-02-08 MED ORDER — ALPRAZOLAM 0.5 MG PO TABS: 0.5000 mg | ORAL_TABLET | Freq: Every day | ORAL | 2 refills | Status: DC

## 2017-02-08 MED ORDER — TEMAZEPAM 30 MG PO CAPS: 30.0000 mg | ORAL_CAPSULE | Freq: Every day | ORAL | 2 refills | Status: DC

## 2017-02-08 NOTE — Progress Notes (Signed)
Subjective:   Renee Waters is a 72 y.o. female who presents for Medicare Annual (Subsequent) preventive examination.  Review of Systems:   Cardiac Risk Factors include: hypertension     Objective:     Vitals: BP (!) 154/86 (BP Location: Left Arm, Patient Position: Sitting, Cuff Size: Normal)   Pulse 80   Temp 98.3 F (36.8 C) (Temporal)   Resp 16   Ht 5\' 5"  (1.651 m)   Wt 119 lb 0.6 oz (54 kg)   SpO2 100%   BMI 19.81 kg/m   Body mass index is 19.81 kg/m.  Advanced Directives 02/08/2017 02/04/2016 09/09/2015 03/20/2014 03/18/2014 12/17/2012 12/05/2012  Does Patient Have a Medical Advance Directive? No No No;Yes No No Patient does not have advance directive;Patient would not like information Patient does not have advance directive;Patient would not like information  Type of Advance Directive - - [place];Living will - - - -  Does patient want to make changes to medical advance directive? - Yes (MAU/Ambulatory/Procedural Areas - Information given) - - - - -  Copy of [place] in Chart? - - No - copy requested - - - -  Would patient like information on creating a medical advance directive? No - Patient declined - No - patient declined information No - patient declined information No - patient declined information - -  Pre-existing out of facility DNR order (yellow form or pink MOST form) - - - - - No -    Tobacco Social History   Tobacco Use  Smoking Status Former Smoker  . Packs/day: 0.25  . Years: 25.00  . Pack years: 6.25  . Types: Cigarettes  . Last attempt to quit: 02/07/2003  . Years since quitting: 14.0  Smokeless Tobacco Never Used  Tobacco Comment   quit in 2004     Counseling given: Not Answered Comment: quit in 2004   Clinical Intake:     Pain : No/denies pain Pain Score: 0-No pain     Diabetes: No  How often do you need to have someone help you when you read instructions, pamphlets, or other written materials  from your doctor or pharmacy?: 1 - Never What is the last grade level you completed in school?: 2 yrs college  Interpreter Needed?: No     Past Medical History:  Diagnosis Date  . Allergy   . Anemia   . Anxiety   . Barrett's esophagus   . Cataract   . Chronic back pain   . Chronic neck pain   . Depression   . Genital herpes   . GERD (gastroesophageal reflux disease)   . History of blood transfusion   . Hypertension   . Insomnia   . Osteoarthritis   . S/P colonoscopy June 2005   normal, no polyps  . S/P endoscopy June 2005, Oct 2009   2005: short-segment Barrett's, 2009: short-segment Barrett's  . UTI (lower urinary tract infection) 10/14   currently on Penicillin-  states is improving   Past Surgical History:  Procedure Laterality Date  . ABDOMINAL HYSTERECTOMY    . BIOPSY N/A 03/20/2014   Procedure: BIOPSY;  Surgeon: Daneil Dolin, MD;  Location: [facility];  Service: Endoscopy;  Laterality: N/A;  . BIOPSY  09/14/2015   Procedure: BIOPSY;  Surgeon: Daneil Dolin, MD;  [facility];  Service: Endoscopy;;  esophageal and gastric  . CARPAL TUNNEL RELEASE Left 2013  . cervical disectomy  2002  . CHOLECYSTECTOMY  with lysis of adhesions for sbo; "ruptured gallbladder".  . COLONOSCOPY  11/09/2011   RMR: Melanosis coli  . COLONOSCOPY WITH PROPOFOL N/A 09/14/2015   Procedure: COLONOSCOPY WITH PROPOFOL;  Surgeon: Daneil Dolin, MD;  [facility];  Service: Endoscopy;  Laterality: N/A;  730  . DENTAL SURGERY  11/2015   multiple tooth extraction  . ESOPHAGOGASTRODUODENOSCOPY  11/29/2007   salmon-colored  tongue   longest stable at  3 cm, distal esophagus as described previously status post biopsy/ Hiatal hernia, otherwise normal stomach D1 and D2  . ESOPHAGOGASTRODUODENOSCOPY  01/06/11   short segment Barrett's esophagus s/p bx/Hiatal hernia  . ESOPHAGOGASTRODUODENOSCOPY (EGD) WITH PROPOFOL N/A 03/20/2014   [id number]:[id number] distal esophagus short segment  barrett's, bx with no dysplasia. next egd in 03/2017  . ESOPHAGOGASTRODUODENOSCOPY (EGD) WITH PROPOFOL N/A 09/14/2015   Procedure: ESOPHAGOGASTRODUODENOSCOPY (EGD) WITH PROPOFOL;  Surgeon: Daneil Dolin, MD;  [facility];  Service: Endoscopy;  Laterality: N/A;  . HERNIA REPAIR Right 07/2010   Dr. Zada Girt  . POLYPECTOMY  09/14/2015   Procedure: POLYPECTOMY;  Surgeon: Daneil Dolin, MD;  [facility];  Service: Endoscopy;;  ascending colon  . right hip replacement  07/2010   went back in sept 2012 to fix  . SHOULDER ARTHROSCOPY  2008   left  . TOTAL HIP REVISION Right 12/17/2012   Procedure: RIGHT TOTAL HIP REVISION;  Surgeon: Mauri Pole, MD;  Location[facility];  Service: Orthopedics;  Laterality: Right;  . WRIST SURGERY Right 2011   open reduction right wrist.   Family History  Problem Relation Age of Onset  . Hypertension Mother   . Stroke Mother   . Colon cancer Neg Hx   . Anesthesia problems Neg Hx   . Hypotension Neg Hx   . Malignant hyperthermia Neg Hx   . Pseudochol deficiency Neg Hx    Social History   Socioeconomic History  . Marital status: Married    Spouse name: louis  . Number of children: 4  . Years of education: None  . Highest education level: Some college, no degree  Social Needs  . Financial resource strain: Not hard at all  . Food insecurity - worry: Never true  . Food insecurity - inability: Never true  . Transportation needs - medical: No  . Transportation needs - non-medical: No  Occupational History  . Occupation: Press photographer  - retired  . Occupation: non profit  Tobacco Use  . Smoking status: Former Smoker    Packs/day: 0.25    Years: 25.00    Pack years: 6.25    Types: Cigarettes    Last attempt to quit: 02/07/2003    Years since quitting: 14.0  . Smokeless tobacco: Never Used  . Tobacco comment: quit in 2004  Substance and Sexual Activity  . Alcohol use: No  . Drug use: No  . Sexual activity: Yes    Birth  control/protection: Surgical  Other Topics Concern  . None  Social History Narrative  . None    Outpatient Encounter Medications as of 02/08/2017  Medication Sig  . acyclovir (ZOVIRAX) 800 MG tablet TAKE ONE TABLET BY MOUTH 4 TIMES DAILY AS NEEDED FOR BREAKOUTS  . ALPRAZolam (XANAX) 0.5 MG tablet TAKE 1 TABLET BY MOUTH ONCE DAILY  . EPINEPHRINE 0.3 mg/0.3 mL IJ SOAJ injection INJECT 0.3 MLS INTO MUSCLE ONCE AS NEEDED  . gabapentin (NEURONTIN) 300 MG capsule One capsule once daily for chronic pain (Patient taking differently: Take 300  mg by mouth as needed. One capsule once daily for chronic pain)  . losartan (COZAAR) 50 MG tablet Take 1 tablet (50 mg total) daily by mouth.  . meloxicam (MOBIC) 7.5 MG tablet TAKE ONE TABLET BY MOUTH ONCE DAILY  . Multiple Vitamins-Minerals (MULTIVITAMINS THER. W/MINERALS) TABS Take 1 tablet by mouth daily.   . Oxycodone HCl 10 MG TABS Take 1 tablet (10 mg total) by mouth 4 (four) times daily.  . pantoprazole (PROTONIX) 40 MG tablet Take 1 tablet (40 mg total) by mouth daily before breakfast.  . senna (SENOKOT) 8.6 MG TABS tablet Take 2 tablets by mouth daily as needed for mild constipation.  . temazepam (RESTORIL) 30 MG capsule TAKE ONE CAPSULE BY MOUTH AT BEDTIME  . tiZANidine (ZANAFLEX) 4 MG tablet One tablet twice daily  . topiramate (TOPAMAX) 50 MG tablet TAKE ONE TABLET BY MOUTH TWICE DAILY  . [DISCONTINUED] mirtazapine (REMERON) 30 MG tablet Take 1 tablet (30 mg total) by mouth at bedtime. (Patient not taking: Reported on 02/08/2017)   No facility-administered encounter medications on file as of 02/08/2017.     Activities of Daily Living In your present state of health, do you have any difficulty performing the following activities: 02/08/2017  Hearing? N  Vision? N  Difficulty concentrating or making decisions? N  Walking or climbing stairs? N  Dressing or bathing? N  Doing errands, shopping? N  Preparing Food and eating ? N  Using the Toilet? N    In the past six months, have you accidently leaked urine? N  Do you have problems with loss of bowel control? N  Managing your Medications? N  Managing your Finances? N  Some recent data might be hidden    Patient Care Team: [name], MD as PCP - General Rourk, [name], MD (Gastroenterology) Carole Civil, MD as Consulting Physician (Orthopedic Surgery)    Assessment:   This is a routine wellness examination for Sundi.  Exercise Activities and Dietary recommendations Current Exercise Habits: Home exercise routine, Type of exercise: stretching, Time (Minutes): 60, Frequency (Times/Week): 7, Weekly Exercise (Minutes/Week): 420, Intensity: Mild, Exercise limited by: None identified  Goals    None      Fall Risk Fall Risk  02/08/2017 08/31/2016 02/04/2016 10/05/2015 07/03/2014  Falls in the past year? No No Yes No No  Comment - - - Emmi Telephone Survey: data to providers prior to load -  Number falls in past yr: - - 1 - -  Injury with Fall? - - No - -  Risk for fall due to : - - Impaired balance/gait - -  Follow up - - Falls prevention discussed - -   Is the patient's home free of loose throw rugs in walkways, pet beds, electrical cords, etc?   yes      Grab bars in the bathroom? yes      Handrails on the stairs?   yes      Adequate lighting?   yes   Depression Screen PHQ 2/9 Scores 02/08/2017 08/31/2016 08/31/2016 06/01/2016  PHQ - 2 Score 0 6 2 6   PHQ- 9 Score - 16 8 19      Cognitive Function     6CIT Screen 02/08/2017 02/04/2016  What Year? 0 points 0 points  What month? 0 points 0 points  What time? 0 points 0 points  Count back from 20 0 points 0 points  Months in reverse 0 points 0 points  Repeat phrase 0 points 0 points  Total Score 0 0    Immunization History  Administered Date(s) Administered  . Influenza Split 11/08/2010, 12/06/2011  . Influenza Whole 11/06/2006, 11/24/2009  . Influenza,inj,Quad PF,6+ Mos 10/31/2012, 01/14/2014,  11/25/2014, 10/05/2015, 11/24/2016  . Pneumococcal Conjugate-13 09/17/2013  . Pneumococcal Polysaccharide-23 02/22/2011  . Td 07/31/2007  . Zoster 07/31/2007    Qualifies for Shingles Vaccine?done  Screening Tests Health Maintenance  Topic Date Due  . TETANUS/TDAP  07/30/2017  . MAMMOGRAM  02/16/2018  . COLONOSCOPY  09/13/2025  . INFLUENZA VACCINE  Completed  . DEXA SCAN  Completed  . Hepatitis C Screening  Completed  . PNA vac Low Risk Adult  Completed    Cancer Screenings: Lung: Low Dose CT Chest recommended if Age 61-80 years, 30 pack-year currently smoking OR have quit w/in 15years. Patient  qualify. Breast:  Up to date on Mammogram? done  Up to date of Bone Density/Dexa? done Colorectal: done  Additional Screenings: n/a Hepatitis B/HIV/Syphillis: Hepatitis C Screening:      Plan:     I have personally reviewed and noted the following in the patient's chart:   . Medical and social history . Use of alcohol, tobacco or illicit drugs  . Current medications and supplements . Functional ability and status . Nutritional status . Physical activity . Advanced directives . List of other physicians . Hospitalizations, surgeries, and ER visits in previous 12 months . Vitals . Screenings to include cognitive, depression, and falls . Referrals and appointments  In addition, I have reviewed and discussed with patient certain preventive protocols, quality metrics, and best practice recommendations. A written personalized care plan for preventive services as well as general preventive health recommendations were provided to patient.     Ova Freshwater, LPN  10/14/4161

## 2017-02-08 NOTE — Patient Instructions (Addendum)
Renee Waters , Thank you for taking time to come for your Medicare Wellness Visit. I appreciate your ongoing commitment to your health goals. Please review the following plan we discussed and let me know if I can assist you in the future.   Screening recommendations/referrals: Colonoscopy: done Mammogram: discussed Bone Density: done Recommended yearly ophthalmology/optometry visit for glaucoma screening and checkup Recommended yearly dental visit for hygiene and checkup  Vaccinations: Influenza vaccine: done Pneumococcal vaccine: done Tdap vaccine: done Shingles vaccine: done     Conditions/risks identified: none  Next appointment: need to sch.   Preventive Care 4 Years and Older, Female Preventive care refers to lifestyle choices and visits with your health care provider that can promote health and wellness. What does preventive care include?  A yearly physical exam. This is also called an annual well check.  Dental exams once or twice a year.  Routine eye exams. Ask your health care provider how often you should have your eyes checked.  Personal lifestyle choices, including:  Daily care of your teeth and gums.  Regular physical activity.  Eating a healthy diet.  Avoiding tobacco and drug use.  Limiting alcohol use.  Practicing safe sex.  Taking low-dose aspirin every day.  Taking vitamin and mineral supplements as recommended by your health care provider. What happens during an annual well check? The services and screenings done by your health care provider during your annual well check will depend on your age, overall health, lifestyle risk factors, and family history of disease. Counseling  Your health care provider may ask you questions about your:  Alcohol use.  Tobacco use.  Drug use.  Emotional well-being.  Home and relationship well-being.  Sexual activity.  Eating habits.  History of falls.  Memory and ability to understand  (cognition).  Work and work Statistician.  Reproductive health. Screening  You may have the following tests or measurements:  Height, weight, and BMI.  Blood pressure.  Lipid and cholesterol levels. These may be checked every 5 years, or more frequently if you are over 69 years old.  Skin check.  Lung cancer screening. You may have this screening every year starting at age 16 if you have a 30-pack-year history of smoking and currently smoke or have quit within the past 15 years.  Fecal occult blood test (FOBT) of the stool. You may have this test every year starting at age 9.  Flexible sigmoidoscopy or colonoscopy. You may have a sigmoidoscopy every 5 years or a colonoscopy every 10 years starting at age 57.  Hepatitis C blood test.  Hepatitis B blood test.  Sexually transmitted disease (STD) testing.  Diabetes screening. This is done by checking your blood sugar (glucose) after you have not eaten for a while (fasting). You may have this done every 1-3 years.  Bone density scan. This is done to screen for osteoporosis. You may have this done starting at age 67.  Mammogram. This may be done every 1-2 years. Talk to your health care provider about how often you should have regular mammograms. Talk with your health care provider about your test results, treatment options, and if necessary, the need for more tests. Vaccines  Your health care provider may recommend certain vaccines, such as:  Influenza vaccine. This is recommended every year.  Tetanus, diphtheria, and acellular pertussis (Tdap, Td) vaccine. You may need a Td booster every 10 years.  Zoster vaccine. You may need this after age 14.  Pneumococcal 13-valent conjugate (PCV13) vaccine. One dose  is recommended after age 39.  Pneumococcal polysaccharide (PPSV23) vaccine. One dose is recommended after age 1. Talk to your health care provider about which screenings and vaccines you need and how often you need  them. This information is not intended to replace advice given to you by your health care provider. Make sure you discuss any questions you have with your health care provider. Document Released: 02/20/2015 Document Revised: 10/14/2015 Document Reviewed: 11/25/2014 Elsevier Interactive Patient Education  2017 [employer] Prevention in the Home Falls can cause injuries. They can happen to people of all ages. There are many things you can do to make your home safe and to help prevent falls. What can I do on the outside of my home?  Regularly fix the edges of walkways and driveways and fix any cracks.  Remove anything that might make you trip as you walk through a door, such as a raised step or threshold.  Trim any bushes or trees on the path to your home.  Use bright outdoor lighting.  Clear any walking paths of anything that might make someone trip, such as rocks or tools.  Regularly check to see if handrails are loose or broken. Make sure that both sides of any steps have handrails.  Any raised decks and porches should have guardrails on the edges.  Have any leaves, snow, or ice cleared regularly.  Use sand or salt on walking paths during winter.  Clean up any spills in your garage right away. This includes oil or grease spills. What can I do in the bathroom?  Use night lights.  Install grab bars by the toilet and in the tub and shower. Do not use towel bars as grab bars.  Use non-skid mats or decals in the tub or shower.  If you need to sit down in the shower, use a plastic, non-slip stool.  Keep the floor dry. Clean up any water that spills on the floor as soon as it happens.  Remove soap buildup in the tub or shower regularly.  Attach bath mats securely with double-sided non-slip rug tape.  Do not have throw rugs and other things on the floor that can make you trip. What can I do in the bedroom?  Use night lights.  Make sure that you have a light by your  bed that is easy to reach.  Do not use any sheets or blankets that are too big for your bed. They should not hang down onto the floor.  Have a firm chair that has side arms. You can use this for support while you get dressed.  Do not have throw rugs and other things on the floor that can make you trip. What can I do in the kitchen?  Clean up any spills right away.  Avoid walking on wet floors.  Keep items that you use a lot in easy-to-reach places.  If you need to reach something above you, use a strong step stool that has a grab bar.  Keep electrical cords out of the way.  Do not use floor polish or wax that makes floors slippery. If you must use wax, use non-skid floor wax.  Do not have throw rugs and other things on the floor that can make you trip. What can I do with my stairs?  Do not leave any items on the stairs.  Make sure that there are handrails on both sides of the stairs and use them. Fix handrails that are broken or loose.  Make sure that handrails are as long as the stairways.  Check any carpeting to make sure that it is firmly attached to the stairs. Fix any carpet that is loose or worn.  Avoid having throw rugs at the top or bottom of the stairs. If you do have throw rugs, attach them to the floor with carpet tape.  Make sure that you have a light switch at the top of the stairs and the bottom of the stairs. If you do not have them, ask someone to add them for you. What else can I do to help prevent falls?  Wear shoes that:  Do not have high heels.  Have rubber bottoms.  Are comfortable and fit you well.  Are closed at the toe. Do not wear sandals.  If you use a stepladder:  Make sure that it is fully opened. Do not climb a closed stepladder.  Make sure that both sides of the stepladder are locked into place.  Ask someone to hold it for you, if possible.  Clearly mark and make sure that you can see:  Any grab bars or handrails.  First and last  steps.  Where the edge of each step is.  Use tools that help you move around (mobility aids) if they are needed. These include:  Canes.  Walkers.  Scooters.  Crutches.  Turn on the lights when you go into a dark area. Replace any light bulbs as soon as they burn out.  Set up your furniture so you have a clear path. Avoid moving your furniture around.  If any of your floors are uneven, fix them.  If there are any pets around you, be aware of where they are.  Review your medicines with your doctor. Some medicines can make you feel dizzy. This can increase your chance of falling. Ask your doctor what other things that you can do to help prevent falls. This information is not intended to replace advice given to you by your health care provider. Make sure you discuss any questions you have with your health care provider. Document Released: 11/20/2008 Document Revised: 07/02/2015 Document Reviewed: 02/28/2014 Elsevier Interactive Patient Education  2017 [employer] for Adults  A healthy lifestyle and preventive care can promote health and wellness. Preventive health guidelines for adults include the following key practices.  . A routine yearly physical is a good way to check with your health care provider about your health and preventive screening. It is a chance to share any concerns and updates on your health and to receive a thorough exam.  . Visit your dentist for a routine exam and preventive care every 6 months. Brush your teeth twice a day and floss once a day. Good oral hygiene prevents tooth decay and gum disease.  . The frequency of eye exams is based on your age, health, family medical history, use  of contact lenses, and other factors. Follow your health care provider's ecommendations for frequency of eye exams.  . Eat a healthy diet. Foods like vegetables, fruits, whole grains, low-fat dairy products, and lean protein foods contain the nutrients you  need without too many calories. Decrease your intake of foods high in solid fats, added sugars, and salt. Eat the right amount of calories for you. Get information about a proper diet from your health care provider, if necessary.  . Regular physical exercise is one of the most important things you can do for your health. Most adults should get at least  150 minutes of moderate-intensity exercise (any activity that increases your heart rate and causes you to sweat) each week. In addition, most adults need muscle-strengthening exercises on 2 or more days a week.  Silver Sneakers may be a benefit available to you. To determine eligibility, you may visit the website: www.silversneakers.com or contact program at 3362009842 Mon-Fri between 8AM-8PM.   . Maintain a healthy weight. The body mass index (BMI) is a screening tool to identify possible weight problems. It provides an estimate of body fat based on height and weight. Your health care provider can find your BMI and can help you achieve or maintain a healthy weight.   For adults 20 years and older: ? A BMI below 18.5 is considered underweight. ? A BMI of 18.5 to 24.9 is normal. ? A BMI of 25 to 29.9 is considered overweight. ? A BMI of 30 and above is considered obese.   . Maintain normal blood lipids and cholesterol levels by exercising and minimizing your intake of saturated fat. Eat a balanced diet with plenty of fruit and vegetables. Blood tests for lipids and cholesterol should begin at age 60 and be repeated every 5 years. If your lipid or cholesterol levels are high, you are over 50, or you are at high risk for heart disease, you may need your cholesterol levels checked more frequently. Ongoing high lipid and cholesterol levels should be treated with medicines if diet and exercise are not working.  . If you smoke, find out from your health care provider how to quit. If you do not use tobacco, please do not start.  . If you choose to drink  alcohol, please do not consume more than 2 drinks per day. One drink is considered to be 12 ounces (355 mL) of beer, 5 ounces (148 mL) of wine, or 1.5 ounces (44 mL) of liquor.  . If you are 50-53 years old, ask your health care provider if you should take aspirin to prevent strokes.  . Use sunscreen. Apply sunscreen liberally and repeatedly throughout the day. You should seek shade when your shadow is shorter than you. Protect yourself by wearing long sleeves, pants, a wide-brimmed hat, and sunglasses year round, whenever you are outdoors.  . Once a month, do a whole body skin exam, using a mirror to look at the skin on your back. Tell your health care provider of new moles, moles that have irregular borders, moles that are larger than a pencil eraser, or moles that have changed in shape or color.

## 2017-02-09 DIAGNOSIS — M545 Low back pain: Secondary | ICD-10-CM | POA: Diagnosis not present

## 2017-02-09 DIAGNOSIS — R768 Other specified abnormal immunological findings in serum: Secondary | ICD-10-CM | POA: Diagnosis not present

## 2017-02-09 DIAGNOSIS — M150 Primary generalized (osteo)arthritis: Secondary | ICD-10-CM | POA: Diagnosis not present

## 2017-02-09 DIAGNOSIS — M2550 Pain in unspecified joint: Secondary | ICD-10-CM | POA: Diagnosis not present

## 2017-02-09 DIAGNOSIS — Z6820 Body mass index (BMI) 20.0-20.9, adult: Secondary | ICD-10-CM | POA: Diagnosis not present

## 2017-02-14 ENCOUNTER — Other Ambulatory Visit: Payer: Self-pay | Admitting: Orthopedic Surgery

## 2017-02-14 ENCOUNTER — Encounter: Payer: Self-pay | Admitting: Internal Medicine

## 2017-02-14 DIAGNOSIS — M1712 Unilateral primary osteoarthritis, left knee: Secondary | ICD-10-CM

## 2017-02-15 ENCOUNTER — Encounter (HOSPITAL_COMMUNITY): Payer: Self-pay | Admitting: Licensed Clinical Social Worker

## 2017-02-15 ENCOUNTER — Ambulatory Visit (INDEPENDENT_AMBULATORY_CARE_PROVIDER_SITE_OTHER): Payer: Medicare Other | Admitting: Licensed Clinical Social Worker

## 2017-02-15 DIAGNOSIS — F331 Major depressive disorder, recurrent, moderate: Secondary | ICD-10-CM

## 2017-02-15 NOTE — Progress Notes (Signed)
   THERAPIST PROGRESS NOTE  Session Time: 11:00 am -11:50 am  Participation Level: Active  Behavioral Response: CasualAlertDepressed  Type of Therapy: Individual Therapy  Treatment Goals addressed: Coping  Interventions: CBT and Solution Focused  Summary: Renee Waters is a 71 y.o. female who presents oriented x5 (person, place, situation, time and object), alert, depressed, tearful, average height, thin, and cooperative to address mood. Patient has a history of medical treatment including hypertension and chronic pain. Patient has minimal history of mental health including outpatient therapy several years ago and medication management. Patient denies symptoms of mania. Patient denies suicidal and homicidal ideations. Patient denies psychosis including auditory and visual hallucinations. Patient denies substance use. Patient is at low risk for lethality at this time.   Patient was sad and frustrated. She noted that she worries about all of her family but she recognizes that she shouldn't. Patient also expressed her frustrations at home with her husband. She does not feel appreciated and feels like she doesn't get time to herself. After discussion, patient recognized that she was more angry than sad. Patient understood that she needs to pay attention to her thoughts which can lead to anxiety and depression, and take time for herself.   Patient engaged in session. She responded well to interventions. Patient continues to meet criteria for Major depressive disorder, recurrent episode, moderate with anxious distress. Patient will continue in outpatient therapy due to being the least restrictive service to meet her needs. Patient made minimal progress on her goals.   Suicidal/Homicidal: Negativewithout intent/plan  Therapist Response: Therapist reviewed patient's recent thoughts and behaviors. Therapist utilized CBT to address mood. Therapist processed patient's feelings to identify triggers for  mood. Therapist explored patient's thoughts that lead to feelings of depression. Therapist assessed patient's relationship with her spouse and her need for recognition. Therapist assisted patient in identifying steps that she can take to manage mood.  Plan: Return again in 4 weeks.  Diagnosis: Axis I: Major depressive disorder, recurrent episode, moderate with anxious distress    Axis II: No diagnosis    Glori Bickers, LCSW 02/15/2017

## 2017-02-20 DIAGNOSIS — G894 Chronic pain syndrome: Secondary | ICD-10-CM | POA: Diagnosis not present

## 2017-02-20 DIAGNOSIS — Z79891 Long term (current) use of opiate analgesic: Secondary | ICD-10-CM | POA: Diagnosis not present

## 2017-02-20 DIAGNOSIS — M47812 Spondylosis without myelopathy or radiculopathy, cervical region: Secondary | ICD-10-CM | POA: Diagnosis not present

## 2017-02-20 DIAGNOSIS — M47816 Spondylosis without myelopathy or radiculopathy, lumbar region: Secondary | ICD-10-CM | POA: Diagnosis not present
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
## 2017-02-22 ENCOUNTER — Other Ambulatory Visit: Payer: Self-pay | Admitting: Family Medicine

## 2017-02-23 ENCOUNTER — Ambulatory Visit (HOSPITAL_COMMUNITY): Payer: Self-pay | Admitting: Psychiatry

## 2017-02-23 NOTE — Telephone Encounter (Signed)
Seen 11 15 18 

## 2017-02-25 ENCOUNTER — Other Ambulatory Visit: Payer: Self-pay | Admitting: Family Medicine

## 2017-03-01 ENCOUNTER — Other Ambulatory Visit (HOSPITAL_COMMUNITY): Payer: Self-pay | Admitting: Psychiatry

## 2017-03-01 ENCOUNTER — Telehealth (HOSPITAL_COMMUNITY): Payer: Self-pay | Admitting: *Deleted

## 2017-03-01 MED ORDER — MIRTAZAPINE 30 MG PO TBDP: 30.0000 mg | ORAL_TABLET | Freq: Every day | ORAL | 0 refills | Status: DC

## 2017-03-01 NOTE — Telephone Encounter (Signed)
I believe it would be the best to discuss face to face about medication (she declines other medication option at prior visit and has not come back to follow up as directed). Remind her of available resources such as ED if any worsening in her symptoms or if she develops SI.

## 2017-03-01 NOTE — Telephone Encounter (Signed)
Dr Modesta Messing Patient called in crying stating that she needs to talk with you. When I asked her what's going on she said I need something a little stronger.  When I asked if she's talking about the Xanax she said yes. It's has to be stronger I need to speak with the doctor # [phone]

## 2017-03-02 ENCOUNTER — Other Ambulatory Visit (HOSPITAL_COMMUNITY): Payer: Self-pay | Admitting: Psychiatry

## 2017-03-02 MED ORDER — MIRTAZAPINE 30 MG PO TABS: 30.0000 mg | ORAL_TABLET | Freq: Every day | ORAL | 0 refills | Status: DC

## 2017-03-02 NOTE — Telephone Encounter (Signed)
Called to follow up & unable to reach patient. Phone just rings. No answering machine picked up
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
## 2017-03-08 ENCOUNTER — Other Ambulatory Visit: Payer: Self-pay | Admitting: Family Medicine

## 2017-03-09 ENCOUNTER — Encounter (HOSPITAL_COMMUNITY): Payer: Self-pay | Admitting: Psychiatry

## 2017-03-09 ENCOUNTER — Ambulatory Visit (INDEPENDENT_AMBULATORY_CARE_PROVIDER_SITE_OTHER): Payer: Medicare Other | Admitting: Psychiatry

## 2017-03-09 VITALS — BP 173/95 | HR 84 | Ht 65.0 in | Wt 122.0 lb

## 2017-03-09 DIAGNOSIS — Z91419 Personal history of unspecified adult abuse: Secondary | ICD-10-CM | POA: Diagnosis not present

## 2017-03-09 DIAGNOSIS — Z79899 Other long term (current) drug therapy: Secondary | ICD-10-CM

## 2017-03-09 DIAGNOSIS — G8929 Other chronic pain: Secondary | ICD-10-CM | POA: Diagnosis not present

## 2017-03-09 DIAGNOSIS — M1990 Unspecified osteoarthritis, unspecified site: Secondary | ICD-10-CM

## 2017-03-09 DIAGNOSIS — I10 Essential (primary) hypertension: Secondary | ICD-10-CM

## 2017-03-09 DIAGNOSIS — Z87891 Personal history of nicotine dependence: Secondary | ICD-10-CM

## 2017-03-09 DIAGNOSIS — F331 Major depressive disorder, recurrent, moderate: Secondary | ICD-10-CM | POA: Diagnosis not present

## 2017-03-09 DIAGNOSIS — Z6379 Other stressful life events affecting family and household: Secondary | ICD-10-CM | POA: Diagnosis not present

## 2017-03-09 MED ORDER — QUETIAPINE FUMARATE 25 MG PO TABS: ORAL_TABLET | ORAL | 0 refills | Status: DC

## 2017-03-09 MED ORDER — MIRTAZAPINE 30 MG PO TABS: 30.0000 mg | ORAL_TABLET | Freq: Every day | ORAL | 0 refills | Status: DC

## 2017-03-09 NOTE — Patient Instructions (Signed)
1. Continue mirtazapine 30 mg at night 2. Start quetiapine 25 mg at night. You may take additional 25 mg as needed for anxiety, irritability 3. Return to clinic in one month for 30 mins

## 2017-03-14 ENCOUNTER — Other Ambulatory Visit: Payer: Self-pay

## 2017-03-14 MED ORDER — TEMAZEPAM 15 MG PO CAPS: 30.0000 mg | ORAL_CAPSULE | Freq: Every evening | ORAL | 4 refills | Status: DC | PRN

## 2017-03-20 ENCOUNTER — Encounter (HOSPITAL_COMMUNITY): Payer: Self-pay | Admitting: Licensed Clinical Social Worker

## 2017-03-20 ENCOUNTER — Other Ambulatory Visit: Payer: Self-pay | Admitting: Family Medicine

## 2017-03-20 ENCOUNTER — Ambulatory Visit (INDEPENDENT_AMBULATORY_CARE_PROVIDER_SITE_OTHER): Payer: Medicare Other | Admitting: Licensed Clinical Social Worker

## 2017-03-20 DIAGNOSIS — Z79891 Long term (current) use of opiate analgesic: Secondary | ICD-10-CM | POA: Diagnosis not present

## 2017-03-20 DIAGNOSIS — F331 Major depressive disorder, recurrent, moderate: Secondary | ICD-10-CM | POA: Diagnosis not present

## 2017-03-20 DIAGNOSIS — G894 Chronic pain syndrome: Secondary | ICD-10-CM | POA: Diagnosis not present

## 2017-03-20 DIAGNOSIS — M47812 Spondylosis without myelopathy or radiculopathy, cervical region: Secondary | ICD-10-CM | POA: Diagnosis not present

## 2017-03-20 DIAGNOSIS — M47816 Spondylosis without myelopathy or radiculopathy, lumbar region: Secondary | ICD-10-CM | POA: Diagnosis not present

## 2017-03-20 NOTE — Progress Notes (Signed)
   THERAPIST PROGRESS NOTE  Session Time: 11:00 am -11:50 am  Participation Level: Active  Behavioral Response: CasualAlertDepressed  Type of Therapy: Individual Therapy  Treatment Goals addressed: Coping  Interventions: CBT and Solution Focused  Summary: Renee Waters is a 72 y.o. female who presents oriented x5 (person, place, situation, time and object), alert, depressed, tearful, average height, thin, and cooperative to address mood. Patient has a history of medical treatment including hypertension and chronic pain. Patient has minimal history of mental health including outpatient therapy several years ago and medication management. Patient denies symptoms of mania. Patient denies suicidal and homicidal ideations. Patient denies psychosis including auditory and visual hallucinations. Patient denies substance use. Patient is at low risk for lethality at this time.   Physically: Patient reported that her hips and knees are hurting. She has not been sleeping well. Patient reported that she has been out of her sleep medication due to it being on back order but it has now come in.  Spiritually/values: No issues identified.  Relationships: Patient reported that she has set boundaries with her children. She is no longer trying to take on their issues. Patient continues to get frustrated with her husband because she does not get any time to herself and he is "hollering" due to being in pain which upsets her.  Emotional/Mental/Behavior: Patient reported that she didn't have a good experience with her last psychiatrist visit. She felt like she wasn't heard and was given medication that isn't helpful for her and that she feels like has caused her to fall twice. Patient understood that she needs to get her sleep regulated. Patient noted that she did something for herself including doing her nails.   Patient engaged in session. She responded well to interventions. Patient continues to meet criteria for  Major depressive disorder, recurrent episode, moderate with anxious distress. Patient will continue in outpatient therapy due to being the least restrictive service to meet her needs. Patient made minimal progress on her goals.   Suicidal/Homicidal: Negativewithout intent/plan  Therapist Response: Therapist reviewed patient's recent thoughts and behaviors. Therapist utilized CBT to address mood. Therapist processed patient's feelings to identify triggers for mood. Therapist assisted patient in identifying steps to take to manage and reduce symptoms of depression.   Plan: Return again in 4 weeks.  Diagnosis: Axis I: Major depressive disorder, recurrent episode, moderate with anxious distress    Axis II: No diagnosis    Glori Bickers, LCSW 03/20/2017

## 2017-03-21 DIAGNOSIS — L659 Nonscarring hair loss, unspecified: Secondary | ICD-10-CM | POA: Diagnosis not present

## 2017-03-21 DIAGNOSIS — L299 Pruritus, unspecified: Secondary | ICD-10-CM | POA: Diagnosis not present

## 2017-03-22 ENCOUNTER — Ambulatory Visit (INDEPENDENT_AMBULATORY_CARE_PROVIDER_SITE_OTHER): Payer: Medicare Other | Admitting: Nurse Practitioner

## 2017-03-22 ENCOUNTER — Encounter: Payer: Self-pay | Admitting: Internal Medicine

## 2017-03-22 ENCOUNTER — Encounter: Payer: Self-pay | Admitting: Nurse Practitioner

## 2017-03-22 VITALS — BP 127/78 | HR 89 | Temp 98.1°F | Ht 65.0 in | Wt 122.2 lb

## 2017-03-22 DIAGNOSIS — K2270 Barrett's esophagus without dysplasia: Secondary | ICD-10-CM | POA: Diagnosis not present

## 2017-03-22 DIAGNOSIS — K5900 Constipation, unspecified: Secondary | ICD-10-CM

## 2017-03-22 DIAGNOSIS — K219 Gastro-esophageal reflux disease without esophagitis: Secondary | ICD-10-CM | POA: Diagnosis not present

## 2017-03-22 MED ORDER — NALOXEGOL OXALATE 25 MG PO TABS: 25.0000 mg | ORAL_TABLET | Freq: Every day | ORAL | 0 refills | Status: DC

## 2017-03-22 NOTE — Assessment & Plan Note (Signed)
3 of opioid-induced constipation.  Linzess 290 mcg was effective but unaffordable.  Amitiza 24 mcg was affordable but not effective.  At this point she is on senna approximately twice a week.  Takes opioids for pain approximately every day.  At this point I will provide her samples of Movantik to see if this helps her bowel movements.  If it is effective and we can try to send in a prescription and see if it is affordable.  If not, Symproic would be another option.  Recommend follow-up in 2 months.  Recommend progress report in 1-2 weeks.

## 2017-03-22 NOTE — Progress Notes (Signed)
Referring Provider: [name], MD Primary Care Physician:  [name], MD Primary GI:  Dr. Gala Romney  Chief Complaint  Patient presents with  . Gastroesophageal Reflux    doing ok  . Barrett's esophagus  . Constipation    HPI:   Renee Waters is a 72 y.o. female who presents recall letter for office visit to reschedule procedure.  Patient was last seen in our office 04/29/2016 for weight loss, constipation, GERD, Barrett's esophagus.  At that time is noted history of GERD/Barrett's esophagus and opioid-induced constipation with weight loss.  Suspected poorly fitting dentures have much to do with her decreased oral intake.  No indication for repeat endoscopic evaluation at that time.  Recommended trial of Linzess 290 mcg for 2 weeks and call with a progress report in 2 weeks.  Continue Protonix, office visit in 3 months.  Called 05/16/2016 indicating Linzess was effective in a prescription was sent in.  However, she could not afford it and requested trial of Amitiza 24 mcg.  However, Amitiza did not work.  The more samples of Linzess.  Referred her to the health department to see if she can qualify for patient assistance related to medications.  Requested tier exception with her insurance which was denied.  Appeal was filed.  Last colonoscopy and upper endoscopy were completed 09/14/2015.  EGD found esophageal mucosal changes consistent with Barrett's that is post biopsy, gastritis status post biopsy, hiatal hernia, normal duodenum.  Pathology found the esophageal biopsy to be distant with Barrett's esophagus and the gastric biopsies to be chronic gastritis.  Colonoscopy completed the same day found diverticulosis in the sigmoid colon and descending colon, single polyp in the mid descending colon status post removal, otherwise normal exam.  Surgical pathology found the polyp to be tubular adenoma.  Recommendations included to new present medications, repeat EGD in 3 years (2020),  repeat colonoscopy in 5 years (2022).  Today she states she's doing well overall. Unable to afford Linzess, Amitiza was ineffective. Takes Senna-S twice a week. If she doesn't take her pain medication she's able to have a bowel movement without assistance. Takes pain medication typically daily. When she does have a bowel movement on Senna it is consistent with [place] 4 and has complete emptying after "all day process." . Denies hematochezia, melena, fever, chills, unintentional weight loss. GERD symptoms well managed. Denies chest pain, dyspnea, dizziness, lightheadedness, syncope, near syncope. Denies any other upper or lower GI symptoms.  Past Medical History:  Diagnosis Date  . Allergy   . Anemia   . Anxiety   . Barrett's esophagus   . Cataract   . Chronic back pain   . Chronic neck pain   . Depression   . Genital herpes   . GERD (gastroesophageal reflux disease)   . History of blood transfusion   . Hypertension   . Insomnia   . Osteoarthritis   . S/P colonoscopy June 2005   normal, no polyps  . S/P endoscopy June 2005, Oct 2009   2005: short-segment Barrett's, 2009: short-segment Barrett's  . UTI (lower urinary tract infection) 10/14   currently on Penicillin-  states is improving    Past Surgical History:  Procedure Laterality Date  . ABDOMINAL HYSTERECTOMY    . BIOPSY N/A 03/20/2014   Procedure: BIOPSY;  Surgeon: Daneil Dolin, MD;  Location: [facility];  Service: Endoscopy;  Laterality: N/A;  . BIOPSY  09/14/2015   Procedure: BIOPSY;  Surgeon: Daneil Dolin, MD;  Location: [facility];  Service: Endoscopy;;  esophageal and gastric  . CARPAL TUNNEL RELEASE Left 2013  . cervical disectomy  2002  . CHOLECYSTECTOMY     with lysis of adhesions for sbo; "ruptured gallbladder".  . COLONOSCOPY  11/09/2011   RMR: Melanosis coli  . COLONOSCOPY WITH PROPOFOL N/A 09/14/2015   Procedure: COLONOSCOPY WITH PROPOFOL;  Surgeon: Daneil Dolin, MD;  Location: [facility];  Service:  Endoscopy;  Laterality: N/A;  730  . DENTAL SURGERY  11/2015   multiple tooth extraction  . ESOPHAGOGASTRODUODENOSCOPY  11/29/2007   salmon-colored  tongue   longest stable at  3 cm, distal esophagus as described previously status post biopsy/ Hiatal hernia, otherwise normal stomach D1 and D2  . ESOPHAGOGASTRODUODENOSCOPY  01/06/11   short segment Barrett's esophagus s/p bx/Hiatal hernia  . ESOPHAGOGASTRODUODENOSCOPY (EGD) WITH PROPOFOL N/A 03/20/2014   [id number] distal esophagus short segment barrett's, bx with no dysplasia. next egd in 03/2017  . ESOPHAGOGASTRODUODENOSCOPY (EGD) WITH PROPOFOL N/A 09/14/2015   Procedure: ESOPHAGOGASTRODUODENOSCOPY (EGD) WITH PROPOFOL;  Surgeon: Daneil Dolin, MD;  Location: [facility];  Service: Endoscopy;  Laterality: N/A;  . HERNIA REPAIR Right 07/2010   Dr. Zada Girt  . LAPAROSCOPIC CHOLECYSTECTOMY  2017   at [facility]  . POLYPECTOMY  09/14/2015   Procedure: POLYPECTOMY;  Surgeon: Daneil Dolin, MD;  Location: [facility];  Service: Endoscopy;;  ascending colon  . right hip replacement  07/2010   went back in sept 2012 to fix  . SHOULDER ARTHROSCOPY  2008   left  . TOTAL HIP REVISION Right 12/17/2012   Procedure: RIGHT TOTAL HIP REVISION;  Surgeon: Mauri Pole, MD;  Location: [facility];  Service: Orthopedics;  Laterality: Right;  . WRIST SURGERY Right 2011   open reduction right wrist.    Current Outpatient Medications  Medication Sig Dispense Refill  . acyclovir (ZOVIRAX) 800 MG tablet TAKE 1 TABLET BY MOUTH 4 TIMES DAILY AS NEEDED FOR  BREAKOUTS 30 tablet 1  . ALPRAZolam (XANAX) 0.5 MG tablet Take 1 tablet (0.5 mg total) by mouth daily. (Patient taking differently: Take 0.5 mg by mouth as needed. ) 30 tablet 2  . Emu Oil OIL by Does not apply route. Takes 2 once a day    . EPINEPHRINE 0.3 mg/0.3 mL IJ SOAJ injection INJECT 0.3 MLS INTO MUSCLE ONCE AS NEEDED 1 Device 2  . gabapentin (NEURONTIN) 300 MG capsule One capsule once daily for chronic  pain (Patient taking differently: Take 300 mg by mouth as needed. One capsule once daily for chronic pain) 90 capsule 3  . losartan (COZAAR) 50 MG tablet TAKE 1 TABLET BY MOUTH ONCE DAILY DISCONTINUE  DIOVAN  EFFECTIVE  12/14/2016 30 tablet 3  . meloxicam (MOBIC) 7.5 MG tablet TAKE 1 TABLET BY MOUTH ONCE DAILY 30 tablet 5  . Multiple Vitamins-Minerals (MULTIVITAMINS THER. W/MINERALS) TABS Take 1 tablet by mouth daily.     . Oxycodone HCl 10 MG TABS Take 1 tablet (10 mg total) by mouth 4 (four) times daily. 56 tablet 0  . pantoprazole (PROTONIX) 40 MG tablet Take 1 tablet (40 mg total) by mouth daily before breakfast. 30 tablet 11  . senna (SENOKOT) 8.6 MG TABS tablet Take 2 tablets by mouth daily as needed for mild constipation.    . temazepam (RESTORIL) 15 MG capsule Take 2 capsules (30 mg total) by mouth at bedtime as needed for sleep. 60 capsule 4  . tiZANidine (ZANAFLEX) 4 MG tablet One  tablet twice daily 60 tablet 5  . topiramate (TOPAMAX) 50 MG tablet TAKE ONE TABLET BY MOUTH TWICE DAILY 60 tablet 3   No current facility-administered medications for this visit.     Allergies as of 03/22/2017 - Review Complete 03/22/2017  Allergen Reaction Noted  . Bee venom Swelling 06/12/2012  . Oxycodone-acetaminophen Rash 01/26/2015    Family History  Problem Relation Age of Onset  . Hypertension Mother   . Stroke Mother   . Colon cancer Neg Hx   . Anesthesia problems Neg Hx   . Hypotension Neg Hx   . Malignant hyperthermia Neg Hx   . Pseudochol deficiency Neg Hx   . Gastric cancer Neg Hx   . Esophageal cancer Neg Hx     Social History   Socioeconomic History  . Marital status: Married    Spouse name: louis  . Number of children: 4  . Years of education: None  . Highest education level: Some college, no degree  Social Needs  . Financial resource strain: Not hard at all  . Food insecurity - worry: Never true  . Food insecurity - inability: Never true  . Transportation needs -  medical: No  . Transportation needs - non-medical: No  Occupational History  . Occupation: Press photographer  - retired  . Occupation: non profit  Tobacco Use  . Smoking status: Former Smoker    Packs/day: 0.25    Years: 25.00    Pack years: 6.25    Types: Cigarettes    Last attempt to quit: 02/07/2003    Years since quitting: 14.1  . Smokeless tobacco: Never Used  . Tobacco comment: quit in 2004  Substance and Sexual Activity  . Alcohol use: No  . Drug use: No  . Sexual activity: Yes    Birth control/protection: Surgical  Other Topics Concern  . None  Social History Narrative  . None    Review of Systems: General: Negative for anorexia, weight loss, fever, chills, fatigue, weakness. ENT: Negative for hoarseness, difficulty swallowing. CV: Negative for chest pain, angina, palpitations, peripheral edema.  Respiratory: Negative for dyspnea at rest, cough, sputum, wheezing.  GI: See history of present illness. Endo: Negative for unusual weight change.  Heme: Negative for bruising or bleeding.   Physical Exam: BP 127/78   Pulse 89   Temp 98.1 F (36.7 C) (Oral)   Ht 5\' 5"  (1.651 m)   Wt 122 lb 3.2 oz (55.4 kg)   BMI 20.34 kg/m  General:   Alert and oriented. Pleasant and cooperative. Well-nourished and well-developed.  Eyes:  Without icterus, sclera clear and conjunctiva pink.  Ears:  Normal auditory acuity. Cardiovascular:  S1, S2 present without murmurs appreciated. Normal pulses noted. Extremities without clubbing or edema. Respiratory:  Clear to auscultation bilaterally. No wheezes, rales, or rhonchi. No distress.  Gastrointestinal:  +BS, soft, non-tender and non-distended. No HSM noted. No guarding or rebound. No masses appreciated.  Rectal:  Deferred  Musculoskalatal:  Symmetrical without gross deformities. Neurologic:  Alert and oriented x4;  grossly normal neurologically. Psych:  Alert and cooperative. Normal mood and affect. Heme/Lymph/Immune: No excessive  bruising noted.    03/22/2017 11:38 AM   Disclaimer: This note was dictated with voice recognition software. Similar sounding words can inadvertently be transcribed and may not be corrected upon review.

## 2017-03-22 NOTE — Patient Instructions (Signed)
1. Stop taking senna for right now. 2. I am giving you samples of Movantik 25 mg.  Take this once a day.  Only take Movantik while you are taking chronic pain medications. 3. I am providing samples for 1-2 weeks. 4. Call us in 1-2 weeks and let us know if it is working.  At that point we can send in a prescription to see if it is covered. 5. Return for follow-up in 2 months. 6. Continue your other medications. 7. Call us if you have any questions or concerns.

## 2017-03-22 NOTE — Assessment & Plan Note (Signed)
3 of Barrett's esophagus, discussed remaining on PPI indefinitely and the patient verbalized understanding.  No overt GERD symptoms.  EGD up-to-date next due in 2020.

## 2017-03-22 NOTE — Assessment & Plan Note (Signed)
Symptoms well controlled on PPI.  Recommend she continue these indefinitely given Barrett's esophagus, as per above.  Follow-up in 2 months.

## 2017-03-23 NOTE — Progress Notes (Signed)
CC'D TO PCP °
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
## 2017-03-30 ENCOUNTER — Encounter (HOSPITAL_COMMUNITY): Payer: Self-pay | Admitting: Psychiatry

## 2017-03-30 ENCOUNTER — Ambulatory Visit (INDEPENDENT_AMBULATORY_CARE_PROVIDER_SITE_OTHER): Payer: Medicare Other | Admitting: Psychiatry

## 2017-03-30 VITALS — BP 165/97 | HR 85 | Ht 65.0 in | Wt 122.0 lb

## 2017-03-30 DIAGNOSIS — F419 Anxiety disorder, unspecified: Secondary | ICD-10-CM | POA: Diagnosis not present

## 2017-03-30 DIAGNOSIS — G4700 Insomnia, unspecified: Secondary | ICD-10-CM

## 2017-03-30 DIAGNOSIS — R450 Nervousness: Secondary | ICD-10-CM | POA: Diagnosis not present

## 2017-03-30 DIAGNOSIS — F331 Major depressive disorder, recurrent, moderate: Secondary | ICD-10-CM

## 2017-03-30 DIAGNOSIS — Z87891 Personal history of nicotine dependence: Secondary | ICD-10-CM

## 2017-03-30 NOTE — Patient Instructions (Signed)
1. Decrease mirtazapine 15 mg at night for one week, then discontinue  2. (She has not taken quetiapine) 3. Return to clinic as needed

## 2017-04-03 DIAGNOSIS — H35363 Drusen (degenerative) of macula, bilateral: Secondary | ICD-10-CM | POA: Diagnosis not present

## 2017-04-03 DIAGNOSIS — H25013 Cortical age-related cataract, bilateral: Secondary | ICD-10-CM | POA: Diagnosis not present

## 2017-04-03 DIAGNOSIS — H2513 Age-related nuclear cataract, bilateral: Secondary | ICD-10-CM | POA: Diagnosis not present

## 2017-04-05 ENCOUNTER — Encounter (HOSPITAL_COMMUNITY): Payer: Self-pay

## 2017-04-05 ENCOUNTER — Ambulatory Visit (INDEPENDENT_AMBULATORY_CARE_PROVIDER_SITE_OTHER): Payer: Medicare Other | Admitting: Family Medicine

## 2017-04-05 ENCOUNTER — Ambulatory Visit (HOSPITAL_COMMUNITY)
Admission: RE | Admit: 2017-04-05 | Discharge: 2017-04-05 | Disposition: A | Discharge status: Home / Self Care | Payer: Medicare Other | Source: Ambulatory Visit | Attending: Family Medicine | Admitting: Family Medicine

## 2017-04-05 ENCOUNTER — Encounter: Payer: Self-pay | Admitting: Family Medicine

## 2017-04-05 VITALS — BP 150/92 | HR 84 | Resp 16 | Ht 65.0 in | Wt 119.0 lb

## 2017-04-05 DIAGNOSIS — I10 Essential (primary) hypertension: Secondary | ICD-10-CM

## 2017-04-05 DIAGNOSIS — F418 Other specified anxiety disorders: Secondary | ICD-10-CM

## 2017-04-05 DIAGNOSIS — Z1231 Encounter for screening mammogram for malignant neoplasm of breast: Secondary | ICD-10-CM

## 2017-04-05 DIAGNOSIS — R52 Pain, unspecified: Secondary | ICD-10-CM

## 2017-04-05 MED ORDER — HYDROCHLOROTHIAZIDE 12.5 MG PO CAPS: 12.5000 mg | ORAL_CAPSULE | Freq: Every day | ORAL | 3 refills | Status: DC

## 2017-04-05 MED ORDER — PREDNISONE 5 MG (21) PO TBPK
5.0000 mg | ORAL_TABLET | ORAL | 0 refills | Status: DC
Start: 2017-04-05 — End: 2017-06-06

## 2017-04-05 NOTE — Patient Instructions (Addendum)
F/u as before, call if you need me sooner  New additional medication for your blood pressure is HCTZ  one daily, continue cozaar  As before    Prednisone dose pack is prescribed for your flare of joint pain  Please be careful not to fall  Thank you  for choosing [facility]. We consider it a privelige to serve you.  Delivering excellent health care in a caring and  compassionate way is our goal.  Partnering with you,  so that together we can achieve this goal is our strategy.

## 2017-04-09 ENCOUNTER — Encounter: Payer: Self-pay | Admitting: Family Medicine

## 2017-04-09 NOTE — Assessment & Plan Note (Addendum)
Reports she will no longer see psychiatry , but is benefiting from the therapist and will continue  States when seroquel was prescribed she got very upset and that when she questioned the psychiatrist , she was advised that she appeared disinterested in following the treatment plan and recordfreview reveals that she has f/u with psychiatrist only as needed. Not suicidal or homicidal

## 2017-04-09 NOTE — Assessment & Plan Note (Signed)
Uncontrolled, medication adjustment made with close follow up DASH diet and commitment to daily physical activity for a minimum of 30 minutes discussed and encouraged, as a part of hypertension management. The importance of attaining a healthy weight is also discussed.  BP/Weight 04/05/2017 03/22/2017 02/08/2017 12/07/2016 08/31/2016 06/01/2016 3/88/8757  Systolic BP 972 820 601 561 [phone]  Diastolic BP 92 78 86 82 84 78 98  Wt. (Lbs) 119 122.2 119.04 118 120.5 121 115.2  BMI 19.8 20.34 19.81 19.64 20.05 20.14 19.17  Some encounter information is confidential and restricted. Go to Review Flowsheets activity to see all data.

## 2017-04-09 NOTE — Assessment & Plan Note (Signed)
Acute flare of generalized pain, prednisone dose pack prescribed

## 2017-04-09 NOTE — Assessment & Plan Note (Signed)
Increased and uncontrolled, prednisone dose pack prescribed

## 2017-04-09 NOTE — Progress Notes (Signed)
   Renee Waters     MRN: [id number]      DOB: 30-Jan-1946   HPI Renee Waters is here for follow up earlier than expected because of persistentrly elevated blood pressure. She denies headache, chest pain, or weakness. She is also concerned  That she will no longer see psychiatry as she had disagreement on medication management , stataes she will continue with the therapist  C/o increased generalized joint pain and requests prednisone dose pack , has been trying to get this from rheumatology but no success, refuses autoimmune management proposed for her dx of SLE, which she states is still being questioned ROS Denies recent fever or chills. Denies sinus pressure, nasal congestion, ear pain or sore throat. Denies chest congestion, productive cough or wheezing. Denies chest pains, palpitations and leg swelling Denies abdominal pain, nausea, vomiting,diarrhea or constipation.   Denies dysuria, frequency, hesitancy or incontinence. . Denies headaches, seizures, numbness, or tingling. C/o  depression, anxiety and  Insomnia.Denies suicidal and homicidal ideation Denies skin break down or rash.   PE  BP (!) 150/92   Pulse 84   Resp 16   Ht 5\' 5"  (1.651 m)   Wt 119 lb (54 kg)   SpO2 97%   BMI 19.80 kg/m   Patient alert and oriented and in no cardiopulmonary distress.  HEENT: No facial asymmetry, EOMI,   oropharynx pink and moist.  Neck supple no JVD, no mass.  Chest: Clear to auscultation bilaterally.  CVS: S1, S2 no murmurs, no S3.Regular rate.  ABD: Soft non tender.   Ext: No edema  MS: Decreased  ROM spine, adequate in shoulders, hips and knees.  Skin: Intact, no ulcerations or rash noted.  Psych: Good eye contact, normal affect. Memory intact  anxious not  depressed appearing.  CNS: CN 2-12 intact, power,  normal throughout.no focal deficits noted.   Assessment & Plan  Essential hypertension Uncontrolled, medication adjustment made with close follow up DASH diet and  commitment to daily physical activity for a minimum of 30 minutes discussed and encouraged, as a part of hypertension management. The importance of attaining a healthy weight is also discussed.  BP/Weight 04/05/2017 03/22/2017 02/08/2017 12/07/2016 08/31/2016 06/01/2016 3/97/6734  Systolic BP 193 790 [phone] 426  Diastolic BP 92 78 86 82 84 78 98  Wt. (Lbs) 119 122.2 119.04 118 120.5 121 115.2  BMI 19.8 20.34 19.81 19.64 20.05 20.14 19.17  Some encounter information is confidential and restricted. Go to Review Flowsheets activity to see all data.       Depression with anxiety Reports she will no longer see psychiatry , but is benefiting from the therapist and will continue  States when seroquel was prescribed she got very upset and that when she questioned the psychiatrist , she was advised that she appeared disinterested in following the treatment plan and recordfreview reveals that she has f/u with psychiatrist only as needed. Not suicidal or homicidal  Backache Increased and uncontrolled, prednisone dose pack prescribed  Generalized pain Acute flare of generalized pain, prednisone dose pack prescribed

## 2017-04-10 ENCOUNTER — Ambulatory Visit (HOSPITAL_COMMUNITY): Payer: Self-pay | Admitting: Psychiatry

## 2017-04-17 DIAGNOSIS — M47812 Spondylosis without myelopathy or radiculopathy, cervical region: Secondary | ICD-10-CM | POA: Diagnosis not present

## 2017-04-17 DIAGNOSIS — Z79891 Long term (current) use of opiate analgesic: Secondary | ICD-10-CM | POA: Diagnosis not present

## 2017-04-17 DIAGNOSIS — M47816 Spondylosis without myelopathy or radiculopathy, lumbar region: Secondary | ICD-10-CM | POA: Diagnosis not present

## 2017-04-17 DIAGNOSIS — G894 Chronic pain syndrome: Secondary | ICD-10-CM | POA: Diagnosis not present

## 2017-04-18 ENCOUNTER — Encounter (HOSPITAL_COMMUNITY): Payer: Self-pay | Admitting: Licensed Clinical Social Worker

## 2017-04-18 ENCOUNTER — Ambulatory Visit (INDEPENDENT_AMBULATORY_CARE_PROVIDER_SITE_OTHER): Payer: Medicare Other | Admitting: Licensed Clinical Social Worker

## 2017-04-18 DIAGNOSIS — F331 Major depressive disorder, recurrent, moderate: Secondary | ICD-10-CM | POA: Diagnosis not present

## 2017-04-18 NOTE — Progress Notes (Signed)
   THERAPIST PROGRESS NOTE  Session Time: 2:00 pm -2:50 pm  Participation Level: Active  Behavioral Response: CasualAlertDepressed  Type of Therapy: Individual Therapy  Treatment Goals addressed: Coping  Interventions: CBT and Solution Focused  Summary: Renee Waters is a 72 y.o. female who presents oriented x5 (person, place, situation, time and object), alert, depressed, tearful, average height, thin, and cooperative to address mood. Patient has a history of medical treatment including hypertension and chronic pain. Patient has minimal history of mental health including outpatient therapy several years ago and medication management. Patient denies symptoms of mania. Patient denies suicidal and homicidal ideations. Patient denies psychosis including auditory and visual hallucinations. Patient denies substance use. Patient is at low risk for lethality at this time.   Physically: Patient reported that she continues to have the typically aches and pains.  Spiritually/values: No issues identified.  Relationships: Patient reported that after setting boundaries with her children, their behavior changed. She reported that they are calling more and are asking that the patient and husband make a list of things they need accomplished around the house. Patient reported that she is going to talk to her husband about his "selfishness." She feels like he wants her attention all of the time and it is difficult to be around someone who is pain constantly. She is picking her battles and not letting small things get on her nerves.  Emotional/Mental/Behavior: Patient noted that she is in a good mood. She is doing things for herself like getting her nails done, and working in the yard. She is picking her battles and not allowing small things to stop her or frustrate her.   Patient engaged in session. She responded well to interventions. Patient continues to meet criteria for Major depressive disorder, recurrent  episode, moderate with anxious distress. Patient will continue in outpatient therapy due to being the least restrictive service to meet her needs. Patient made moderate progress on her goals.   Suicidal/Homicidal: Negativewithout intent/plan  Therapist Response: Therapist reviewed patient's recent thoughts and behaviors. Therapist utilized CBT to address mood. Therapist processed patient's feelings to identify triggers for mood. Therapist discussed her relationship with her children and husband and the "selfishness" she perceives from them.   Plan: Return again in 4 weeks.  Diagnosis: Axis I: Major depressive disorder, recurrent episode, moderate with anxious distress    Axis II: No diagnosis    Glori Bickers, LCSW 04/18/2017

## 2017-05-05 ENCOUNTER — Other Ambulatory Visit: Payer: Self-pay | Admitting: Family Medicine

## 2017-05-05 MED ORDER — ALPRAZOLAM 0.5 MG PO TABS: 0.5000 mg | ORAL_TABLET | Freq: Every day | ORAL | 5 refills | Status: DC

## 2017-05-05 NOTE — Progress Notes (Signed)
Xanax

## 2017-05-16 DIAGNOSIS — M47812 Spondylosis without myelopathy or radiculopathy, cervical region: Secondary | ICD-10-CM | POA: Diagnosis not present

## 2017-05-16 DIAGNOSIS — M47816 Spondylosis without myelopathy or radiculopathy, lumbar region: Secondary | ICD-10-CM | POA: Diagnosis not present

## 2017-05-16 DIAGNOSIS — Z79891 Long term (current) use of opiate analgesic: Secondary | ICD-10-CM | POA: Diagnosis not present

## 2017-05-16 DIAGNOSIS — G894 Chronic pain syndrome: Secondary | ICD-10-CM | POA: Diagnosis not present

## 2017-05-18 ENCOUNTER — Ambulatory Visit (INDEPENDENT_AMBULATORY_CARE_PROVIDER_SITE_OTHER): Payer: Medicare Other | Admitting: Licensed Clinical Social Worker

## 2017-05-18 DIAGNOSIS — F331 Major depressive disorder, recurrent, moderate: Secondary | ICD-10-CM

## 2017-05-19 ENCOUNTER — Ambulatory Visit (HOSPITAL_COMMUNITY): Payer: Medicare Other | Admitting: Licensed Clinical Social Worker

## 2017-05-19 ENCOUNTER — Encounter (HOSPITAL_COMMUNITY): Payer: Self-pay | Admitting: Licensed Clinical Social Worker

## 2017-05-19 ENCOUNTER — Other Ambulatory Visit: Payer: Self-pay | Admitting: Family Medicine

## 2017-05-19 NOTE — Progress Notes (Signed)
   THERAPIST PROGRESS NOTE  Session Time: 3:00 pm -3:50 pm  Participation Level: Active  Behavioral Response: CasualAlertDepressed  Type of Therapy: Individual Therapy  Treatment Goals addressed: Coping  Interventions: CBT and Solution Focused  Summary: Renee Waters is a 72 y.o. female who presents oriented x5 (person, place, situation, time and object), alert, depressed, tearful, average height, thin, and cooperative to address mood. Patient has a history of medical treatment including hypertension and chronic pain. Patient has minimal history of mental health including outpatient therapy several years ago and medication management. Patient denies symptoms of mania. Patient denies suicidal and homicidal ideations. Patient denies psychosis including auditory and visual hallucinations. Patient denies substance use. Patient is at low risk for lethality at this time.   Physically: Patient reported that she is going well physically and is managing her pain.  Spiritually/values: No issues identified.  Relationships: Patient was concerned about her daughter and grandchildren. She explained that the grandchildren did not reach out and acknowledge her husband's birthday which really hurt him. She feels like they could have reached out. Patient feels like she has done plenty for them in the past but they couldn't even reach out on his birthday. She feels used and manipulated by them. After discussion, patient understood that she needs to address this with their mother and them but in a way to not cause further arguments. Patient is worried that if she says anything, they will get mad and not come around.  Emotional/Mental/Behavior: Patient was frustrated. She understood that the situation with her daughter and grandchildren does not have to define her or her mood and that it will be temporary.   Patient engaged in session. She responded well to interventions. Patient continues to meet criteria for  Major depressive disorder, recurrent episode, moderate with anxious distress. Patient will continue in outpatient therapy due to being the least restrictive service to meet her needs. Patient made moderate progress on her goals.   Suicidal/Homicidal: Negativewithout intent/plan  Therapist Response: Therapist reviewed patient's recent thoughts and behaviors. Therapist utilized CBT to address mood. Therapist processed patient's feelings to identify triggers for mood. Therapist discussed patient's relationship with her grandchildren.   Plan: Return again in 4 weeks.  Diagnosis: Axis I: Major depressive disorder, recurrent episode, moderate with anxious distress    Axis II: No diagnosis    Glori Bickers, LCSW 05/19/2017

## 2017-05-23 DIAGNOSIS — Z791 Long term (current) use of non-steroidal anti-inflammatories (NSAID): Secondary | ICD-10-CM | POA: Diagnosis not present

## 2017-05-23 DIAGNOSIS — Z79899 Other long term (current) drug therapy: Secondary | ICD-10-CM | POA: Diagnosis not present

## 2017-05-23 DIAGNOSIS — R109 Unspecified abdominal pain: Secondary | ICD-10-CM | POA: Diagnosis not present

## 2017-05-23 DIAGNOSIS — G894 Chronic pain syndrome: Secondary | ICD-10-CM | POA: Diagnosis present

## 2017-05-23 DIAGNOSIS — K5669 Other partial intestinal obstruction: Secondary | ICD-10-CM | POA: Diagnosis not present

## 2017-05-23 DIAGNOSIS — F17210 Nicotine dependence, cigarettes, uncomplicated: Secondary | ICD-10-CM | POA: Diagnosis present

## 2017-05-23 DIAGNOSIS — I10 Essential (primary) hypertension: Secondary | ICD-10-CM | POA: Diagnosis present

## 2017-05-23 DIAGNOSIS — M479 Spondylosis, unspecified: Secondary | ICD-10-CM | POA: Diagnosis not present

## 2017-05-23 DIAGNOSIS — R1084 Generalized abdominal pain: Secondary | ICD-10-CM | POA: Diagnosis not present

## 2017-05-23 DIAGNOSIS — K219 Gastro-esophageal reflux disease without esophagitis: Secondary | ICD-10-CM | POA: Diagnosis not present

## 2017-05-23 DIAGNOSIS — K56600 Partial intestinal obstruction, unspecified as to cause: Secondary | ICD-10-CM | POA: Diagnosis present

## 2017-05-23 DIAGNOSIS — A6000 Herpesviral infection of urogenital system, unspecified: Secondary | ICD-10-CM | POA: Diagnosis present

## 2017-05-24 ENCOUNTER — Ambulatory Visit: Payer: Medicare Other | Admitting: Nurse Practitioner

## 2017-05-28 DIAGNOSIS — R109 Unspecified abdominal pain: Secondary | ICD-10-CM | POA: Diagnosis not present

## 2017-05-28 DIAGNOSIS — R1030 Lower abdominal pain, unspecified: Secondary | ICD-10-CM | POA: Diagnosis not present

## 2017-05-28 DIAGNOSIS — R1084 Generalized abdominal pain: Secondary | ICD-10-CM | POA: Diagnosis not present

## 2017-05-28 DIAGNOSIS — Z87891 Personal history of nicotine dependence: Secondary | ICD-10-CM | POA: Diagnosis not present

## 2017-05-29 ENCOUNTER — Telehealth: Payer: Self-pay

## 2017-05-29 ENCOUNTER — Other Ambulatory Visit: Payer: Self-pay | Admitting: Family Medicine

## 2017-05-29 MED ORDER — TEMAZEPAM 30 MG PO CAPS: 30.0000 mg | ORAL_CAPSULE | Freq: Every evening | ORAL | 3 refills | Status: DC | PRN

## 2017-05-29 NOTE — Progress Notes (Signed)
restoril 

## 2017-05-29 NOTE — Telephone Encounter (Signed)
Transition Care Management Follow-up Telephone Call   Date discharged? 05/27/17               How have you been since you were released from the hospital? Had some belly pain, went back last night and was told to stop the laxative because it was irritating the lining of the stomach. I've had 2 bowel movements this morning and feel much better.    Do you understand why you were in the hospital? Yes, had block bowels. Thought it was a complete blockage but was only a partial blockage   Do you understand the discharge instructions? yes   Where were you discharged to? home   Items Reviewed:  Medications reviewed: yes  Allergies reviewed: yes  Dietary changes reviewed: yes,   Referrals reviewed: none made   Functional Questionnaire:   Activities of Daily Living (ADLs):  no restrictions, everything back to normal   Any transportation issues/concerns?: none   Any patient concerns? none   Confirmed importance and date/time of follow-up visits scheduled    Confirmed with patient if condition begins to worsen call PCP or go to the ER.  Patient was given the office number and encouraged to call back with question or concerns.  : yes, patient understands.

## 2017-05-31 ENCOUNTER — Telehealth: Payer: Self-pay

## 2017-05-31 NOTE — Telephone Encounter (Signed)
[employer] sent over a PA  request for Relistor 150mg  and the patient called and said she needed [place] to try to get it approved but I did not do it because it wasn't prescribed by you. She said if she can't get that one then she needs you to prescribe her something else similar to help

## 2017-06-01 ENCOUNTER — Other Ambulatory Visit: Payer: Self-pay | Admitting: Family Medicine

## 2017-06-01 ENCOUNTER — Telehealth: Payer: Self-pay | Admitting: Internal Medicine

## 2017-06-01 MED ORDER — POLYETHYLENE GLYCOL 3350 17 G PO PACK: PACK | ORAL | 11 refills | Status: DC

## 2017-06-01 NOTE — Telephone Encounter (Signed)
Spoke with pt, samples are ready for pickup. Ok per EG last ov note 03/2017.

## 2017-06-01 NOTE — Telephone Encounter (Signed)
Pt called to see if she could get samples of movantik. Please advise. [phone]

## 2017-06-01 NOTE — Progress Notes (Signed)
miralax

## 2017-06-01 NOTE — Telephone Encounter (Signed)
pls call pt and let her know that I sent in an alternate med for constipation and she needs to use a stool softener daily with it

## 2017-06-02 NOTE — Telephone Encounter (Signed)
Spoke with patient and let her know Dr.Simpson had called her in an alternative medication and she needs to use a stool softener daily with verbal understanding.

## 2017-06-06 ENCOUNTER — Ambulatory Visit (INDEPENDENT_AMBULATORY_CARE_PROVIDER_SITE_OTHER): Payer: Medicare Other | Admitting: Family Medicine

## 2017-06-06 ENCOUNTER — Encounter: Payer: Self-pay | Admitting: Family Medicine

## 2017-06-06 VITALS — BP 140/78 | HR 86 | Resp 16 | Ht 65.0 in | Wt 115.0 lb

## 2017-06-06 DIAGNOSIS — G894 Chronic pain syndrome: Secondary | ICD-10-CM | POA: Diagnosis not present

## 2017-06-06 DIAGNOSIS — M47816 Spondylosis without myelopathy or radiculopathy, lumbar region: Secondary | ICD-10-CM | POA: Diagnosis not present

## 2017-06-06 DIAGNOSIS — I10 Essential (primary) hypertension: Secondary | ICD-10-CM | POA: Diagnosis not present

## 2017-06-06 DIAGNOSIS — M47812 Spondylosis without myelopathy or radiculopathy, cervical region: Secondary | ICD-10-CM | POA: Diagnosis not present

## 2017-06-06 DIAGNOSIS — Z79891 Long term (current) use of opiate analgesic: Secondary | ICD-10-CM | POA: Diagnosis not present

## 2017-06-06 DIAGNOSIS — Z09 Encounter for follow-up examination after completed treatment for conditions other than malignant neoplasm: Secondary | ICD-10-CM | POA: Diagnosis not present

## 2017-06-06 NOTE — Progress Notes (Signed)
   [name]     MRN: [id number]      DOB: 04-15-1945   HPI [name] is here for follow up hospitalization for partial SBO 4/16 to 05/27/2017 at [facility] induced  By her pain medication, she actually returned to the Ed the following day but was not kept. She has done well since on an essentially liquid and white meat diet, , and is coming from her pain specialist where she just had her medication changed back to the oxycodone which she tolerated well,  She had recently started morphine when she enveloped constipation problems She does have an upcoming gI appt when she hopes to be able to get  Specific laxative approved which she thinks may be more beneficial for her. ROS Denies recent fever or chills. Denies sinus pressure, nasal congestion, ear pain or sore throat. Denies chest congestion, productive cough or wheezing. Denies chest pains, palpitations and leg swelling  Denies dysuria, frequency, hesitancy or incontinence. Denies  Uncontrolled joint pain, swelling and limitation in mobility. Denies headaches, seizures, numbness, or tingling. Denies uncontrolled  depression, anxiety or insomnia.Benefits from her therapist Denies skin break down or rash.   PE  BP 140/78   Pulse 86   Resp 16   Ht 5\' 5"  (1.651 m)   Wt 115 lb (52.2 kg)   SpO2 95%   BMI 19.14 kg/m   Patient alert and oriented and in no cardiopulmonary distress.  HEENT: No facial asymmetry, EOMI,   oropharynx pink and moist.  Neck supple no JVD, no mass.  Chest: Clear to auscultation bilaterally.  CVS: S1, S2 no murmurs, no S3.Regular rate.  ABD: Soft generalized superficial tenderness, no guarding or rebound, reduced bowel sounds, not distended .   Ext: No edema  MS: Adequate though reduced ROM spine, shoulders, hips and knees.  Skin: Intact, no ulcerations or rash noted.  Psych: Good eye contact, normal affect. Memory intact not anxious or depressed appearing.  CNS: CN 2-12 intact, power,  normal  throughout.no focal deficits noted.   [facility] discharge follow-up Hospital records reviewed , the course as well as lab and imaging results while pt was at the visit and all questions answered to her satisfaction. Her main concern is that she does have her pain medication changed back to the previous medication she was taking and Is much less afraid that she will have constipation and bowel obstruction recurrence on her old regime  Essential hypertension Sub optimal control, no medication change at this time

## 2017-06-06 NOTE — Patient Instructions (Addendum)
F/u in 4.5 months, call if you need me before  Thankful you are dong better.  No changes in current better medication  All the best

## 2017-06-07 ENCOUNTER — Ambulatory Visit: Payer: Self-pay | Admitting: Family Medicine

## 2017-06-13 NOTE — Assessment & Plan Note (Signed)
Hospital records reviewed , the course as well as lab and imaging results while pt was at the visit and all questions answered to her satisfaction. Her main concern is that she does have her pain medication changed back to the previous medication she was taking and Is much less afraid that she will have constipation and bowel obstruction recurrence on her old regime

## 2017-06-13 NOTE — Assessment & Plan Note (Signed)
Sub optimal control, no medication change at this time

## 2017-06-14 ENCOUNTER — Encounter: Payer: Self-pay | Admitting: Gastroenterology

## 2017-06-14 ENCOUNTER — Ambulatory Visit (INDEPENDENT_AMBULATORY_CARE_PROVIDER_SITE_OTHER): Payer: Medicare Other | Admitting: Gastroenterology

## 2017-06-14 VITALS — BP 131/87 | HR 79 | Temp 98.4°F | Ht 65.0 in | Wt 119.2 lb

## 2017-06-14 DIAGNOSIS — K5903 Drug induced constipation: Secondary | ICD-10-CM

## 2017-06-14 NOTE — Progress Notes (Signed)
Primary Care Physician: [name], MD  Primary Gastroenterologist:  Garfield Cornea, MD   Chief Complaint  Patient presents with  . Constipation    doing better since in hosp for bowel blockage    HPI: Renee Waters is a 72 y.o. female here for follow up.  She was last seen by our office in February.  She has a history of constipation, GERD, Barrett's esophagus, weight loss.  Previously unable to afford Linzess.  Amitiza ineffective.  She had recent hospitalization for partial small bowel obstruction in April at [facility].   EGD in August 2017, she had changes consistent with Barrett's esophagus, no dysplasia on biopsy. Gastritis, biopsies benign, hiatal hernia. Ileocolonoscopy with melanosis coli, diverticulosis, 3 mm tubular adenoma removed. She will need follow-up EGD in 3 years for Barrett's, colonoscopy in 5 years for tubular adenoma.  Patient states she was having troubles with her BMs for two weeks before her bowel obstruction. Her pain medication had been changed from oxycodone to norco and morphine.  She states was changed because she had developed a rash on oxycodone.  She reports tolerating oxycodone as long she takes allergy pill with it.  She started having problems going to the bathroom for bowel movement.  Her stomach started swelling and had pain.  Symptoms developed within 2 days of starting morphine.  She reports going to the emergency department at [facility] and was hospitalized for several days.  She was able to move a lot of stool out of her colon.  She states that she really did not have a trial on real food prior to being discharged and when she went home she started swelling up again and had to return to the hospital.  Currently she is taking MiraLAX 1 capful daily/senna-s 2 daily/slow-moving at night.  Regimen is not very effective and she has to cut back on her pain medication in order to have a bowel movement.  Previously did well Movantik but going to cost her  $400/month because it is tier 4. She wants [place] to try and get tier reduction. She is concerned about ongoing management of her constipation as current regimen is not helpful unless she reducer her pain medication (ie takes less pills daily). She denies any vomiting. No melena, brbpr. No hb.   She brought a copy of her labs from recent hospitalization.  Hemoglobin 14.7, hematocrit 43.6, platelets 261,000, white blood cell count 6000, total bilirubin 0.4, alkaline phosphatase 119 slightly high, AST 29, ALT 10, albumin 4.6, hemoglobin A1c 5.7, lipase 15  CT abdomen pelvis with contrast dated 05/23/2017: Stomach contains fairly large amount of oral contrast, mild dilated loops of proximal small bowel left upper quadrant which are fluid-filled and measure up to 3 cm, there is some bowel wall thickening in the proximal small bowel left upper quadrant to 3 mm.  This bowel wall thickening occurs over a long segment.  No contrast reaches the distal ileum.  There was some suggestion of twisting of the bowel/mesentery.  Findings concerning for early obstruction secondary to internal hernia or closed loop obstruction.  Abdominal x-ray dated 05/28/2017 with stable gaseous distention of bowel in the left abdomen and pelvis.      Current Outpatient Medications  Medication Sig Dispense Refill  . ALPRAZolam (XANAX) 0.5 MG tablet Take 1 tablet (0.5 mg total) by mouth at bedtime. 30 tablet 5  . Emu Oil OIL by Does not apply route. Takes 2 once a day    .  EPINEPHRINE 0.3 mg/0.3 mL IJ SOAJ injection INJECT 0.3 MLS INTO MUSCLE ONCE AS NEEDED 1 Device 2  . gabapentin (NEURONTIN) 300 MG capsule One capsule once daily for chronic pain (Patient taking differently: Take 300 mg by mouth as needed. One capsule once daily for chronic pain) 90 capsule 3  . hydrochlorothiazide (MICROZIDE) 12.5 MG capsule Take 1 capsule (12.5 mg total) by mouth daily. 30 capsule 3  . meloxicam (MOBIC) 7.5 MG tablet TAKE 1 TABLET BY MOUTH ONCE DAILY  30 tablet 5  . Multiple Vitamins-Minerals (MULTIVITAMINS THER. W/MINERALS) TABS Take 1 tablet by mouth daily.     . naloxegol oxalate (MOVANTIK) 25 MG TABS tablet Take 1 tablet (25 mg total) by mouth daily. 9 tablet 0  . Oxycodone HCl 10 MG TABS Take 1 tablet (10 mg total) by mouth 4 (four) times daily. 56 tablet 0  . pantoprazole (PROTONIX) 40 MG tablet Take 1 tablet (40 mg total) by mouth daily before breakfast. 30 tablet 11  . polyethylene glycol (MIRALAX / GLYCOLAX) packet 17 gm in 8 ounces water daily 3350 packet 11  . senna (SENOKOT) 8.6 MG TABS tablet Take 2 tablets by mouth daily as needed for mild constipation.    . temazepam (RESTORIL) 30 MG capsule Take 1 capsule (30 mg total) by mouth at bedtime as needed for sleep. 30 capsule 3  . tiZANidine (ZANAFLEX) 4 MG tablet One tablet twice daily 60 tablet 5   No current facility-administered medications for this visit.     Allergies as of 06/14/2017 - Review Complete 06/14/2017  Allergen Reaction Noted  . Bee venom Swelling 06/12/2012  . Oxycodone-acetaminophen Rash 01/26/2015    ROS:  General: Negative for anorexia, weight loss, fever, chills, fatigue, weakness. ENT: Negative for hoarseness, difficulty swallowing , nasal congestion. CV: Negative for chest pain, angina, palpitations, dyspnea on exertion, peripheral edema.  Respiratory: Negative for dyspnea at rest, dyspnea on exertion, cough, sputum, wheezing.  GI: See history of present illness. GU:  Negative for dysuria, hematuria, urinary incontinence, urinary frequency, nocturnal urination.  Endo: Negative for unusual weight change.    Physical Examination:   BP 131/87   Pulse 79   Temp 98.4 F (36.9 C) (Oral)   Ht 5\' 5"  (1.651 m)   Wt 119 lb 3.2 oz (54.1 kg)   BMI 19.84 kg/m   General: Well-nourished, well-developed in no acute distress.  Eyes: No icterus. Mouth: Oropharyngeal mucosa moist and pink , no lesions erythema or exudate. Lungs: Clear to auscultation  bilaterally.  Heart: Regular rate and rhythm, no murmurs rubs or gallops.  Abdomen: Bowel sounds are normal, mild epig tenderness. Nondistended. Normal BS. No rebound or guarding.  No masses, HSM. Extremities: No lower extremity edema. No clubbing or deformities. Neuro: Alert and oriented x 4   Skin: Warm and dry, no jaundice.   Psych: Alert and cooperative, normal mood and affect.  Labs:  Lab Results  Component Value Date   CREATININE 0.90 12/07/2016   BUN 16 12/07/2016   NA 137 12/07/2016   K 4.1 12/07/2016   CL 103 12/07/2016   CO2 20 12/07/2016    Imaging Studies: No results found.

## 2017-06-14 NOTE — Patient Instructions (Addendum)
1. Restart Movantik 25mg  daily 1 hour before or 2 hours after a meal.  2. We will work on tier exception for [employer].  3. Call for samples next week to see if we have anymore.  4. You can continue Miralax and Senna daily especially on days you do not have Movantik.  5. I will review your CT reports to make sure we do not need any follow up imaging.

## 2017-06-15 NOTE — Assessment & Plan Note (Signed)
72 year old female with history of opioid-induced constipation recently developing partial small bowel obstruction as outlined above.  Unfortunately she is not doing well on her current regimen of MiraLAX, senna S, slow move.  And Amitiza and Linzess not great options, she reports neither were very effective and not affordable.  Amitiza was more affordable but less effective than Linzess and Linzess was not affordable at all.  She cannot afford it at all.  She found Movantik to be very effective to but will cost her $400 a month because of tier for status.  We have provided samples of Movantik today.  We will try to get tier exception/tier reduction for her.  I will discuss recent CT findings regarding small bowel abnormality with Dr. Gala Romney to determine if any further imaging is necessary.

## 2017-06-19 ENCOUNTER — Telehealth: Payer: Self-pay | Admitting: Gastroenterology

## 2017-06-19 DIAGNOSIS — R933 Abnormal findings on diagnostic imaging of other parts of digestive tract: Secondary | ICD-10-CM

## 2017-06-19 NOTE — Telephone Encounter (Signed)
Please let patient know that Dr. Gala Romney recommends CTE to further evaluate abnormal small bowel in luq seen in 05/2017.

## 2017-06-20 NOTE — Telephone Encounter (Signed)
Sent to [facility] refill by mistake, routing to [place]

## 2017-06-20 NOTE — Telephone Encounter (Signed)
Pt notified. Please schedule CTE per [facility].

## 2017-06-20 NOTE — Telephone Encounter (Signed)
CTE scheduled for 06/23/17 at 12:00pm, arrive at 10:30am. NPO 4 hours prior to test. Called and informed pt, letter mailed.

## 2017-06-20 NOTE — Addendum Note (Signed)
Addended by: Zara Council C on: 06/20/2017 11:12 AM   Modules accepted: Orders

## 2017-06-23 ENCOUNTER — Ambulatory Visit (HOSPITAL_COMMUNITY)
Admission: RE | Admit: 2017-06-23 | Discharge: 2017-06-23 | Disposition: A | Discharge status: Home / Self Care | Payer: Medicare Other | Source: Ambulatory Visit | Attending: Gastroenterology | Admitting: Gastroenterology

## 2017-06-23 ENCOUNTER — Encounter (HOSPITAL_COMMUNITY): Payer: Self-pay

## 2017-06-23 DIAGNOSIS — I700 Atherosclerosis of aorta: Secondary | ICD-10-CM | POA: Insufficient documentation

## 2017-06-23 DIAGNOSIS — M5416 Radiculopathy, lumbar region: Secondary | ICD-10-CM | POA: Diagnosis not present

## 2017-06-23 DIAGNOSIS — R634 Abnormal weight loss: Secondary | ICD-10-CM | POA: Diagnosis not present

## 2017-06-23 DIAGNOSIS — K562 Volvulus: Secondary | ICD-10-CM | POA: Diagnosis not present

## 2017-06-23 DIAGNOSIS — R140 Abdominal distension (gaseous): Secondary | ICD-10-CM | POA: Diagnosis not present

## 2017-06-23 DIAGNOSIS — R933 Abnormal findings on diagnostic imaging of other parts of digestive tract: Secondary | ICD-10-CM | POA: Diagnosis not present

## 2017-06-23 LAB — POCT I-STAT CREATININE: Creatinine, Ser: 1 mg/dL (ref 0.44–1.00)

## 2017-06-23 MED ORDER — IOPAMIDOL (ISOVUE-300) INJECTION 61%
100.0000 mL | Freq: Once | INTRAVENOUS | Status: AC | PRN
  Administered 2017-06-23: 100 mL via INTRAVENOUS

## 2017-06-28 ENCOUNTER — Telehealth: Payer: Self-pay

## 2017-06-28 NOTE — Telephone Encounter (Signed)
Spoke with pt and she does have different prescription coverage. ID# [id number], Kara Dies [id number], PCN [id number], GP [id number], contact info [phone]. Will work on tier exception.

## 2017-06-28 NOTE — Telephone Encounter (Signed)
LMOM, waiting on a return call. Working on [place] for Movantik 25mg , when speaking to medicare, pt has separate prescription coverage for prescriptions. Prescription coverage info is needed from pt to complete tier exception.

## 2017-06-29 NOTE — Telephone Encounter (Signed)
Tier Exception started over the phone. Waiting on an approval. This medication didn't need a PA due to being covered at a cost of $300.00.

## 2017-07-04 ENCOUNTER — Ambulatory Visit (INDEPENDENT_AMBULATORY_CARE_PROVIDER_SITE_OTHER): Payer: Medicare Other | Admitting: Licensed Clinical Social Worker

## 2017-07-04 ENCOUNTER — Encounter (HOSPITAL_COMMUNITY): Payer: Self-pay | Admitting: Licensed Clinical Social Worker

## 2017-07-04 DIAGNOSIS — F331 Major depressive disorder, recurrent, moderate: Secondary | ICD-10-CM

## 2017-07-04 NOTE — Progress Notes (Signed)
   THERAPIST PROGRESS NOTE  Session Time: 11:00 am -11:50 am  Participation Level: Active  Behavioral Response: CasualAlertDepressed  Type of Therapy: Individual Therapy  Treatment Goals addressed: Coping  Interventions: CBT and Solution Focused  Summary: Renee Waters is a 72 y.o. female who presents oriented x5 (person, place, situation, time and object), alert, depressed, tearful, average height, thin, and cooperative to address mood. Patient has a history of medical treatment including hypertension and chronic pain. Patient has minimal history of mental health including outpatient therapy several years ago and medication management. Patient denies symptoms of mania. Patient denies suicidal and homicidal ideations. Patient denies psychosis including auditory and visual hallucinations. Patient denies substance use. Patient is at low risk for lethality at this time.   Physically: Patient reported that she had constipation/blockage that she had to go to the hospital about. Patient is managing her pain and feels like the change in pain medication caused the constipation. Spiritually/values: No issues identified.  Relationships: Patient is feeling annoyed with her husband. She feels like he never leaves her around. She would like some time to herself.  Emotional/Mental/Behavior: Patient reported that she has made progress on goals. She is doing better than before and feels like she doesn't need to meet as often. Patient has "moments" or periods of disrupted mood.    Patient engaged in session. She responded well to interventions. Patient continues to meet criteria for Major depressive disorder, recurrent episode, moderate with anxious distress. Patient will continue in outpatient therapy due to being the least restrictive service to meet her needs. Patient made moderate progress on her goals.   Suicidal/Homicidal: Negativewithout intent/plan  Therapist Response: Therapist reviewed patient's  recent thoughts and behaviors. Therapist utilized CBT to address mood. Therapist reviewed patient's goals. Therapist processed patient's feelings to identify triggers for mood. Therapist discussed patient's relationship with her husband and her physical health.   Plan: Return again in 4 weeks.  Diagnosis: Axis I: Major depressive disorder, recurrent episode, moderate with anxious distress    Axis II: No diagnosis    Glori Bickers, LCSW 07/04/2017

## 2017-07-04 NOTE — Telephone Encounter (Signed)
That's unfortunate regarding tier exception denial.   She can continue colace or senna as before but would try increasing miralax to 17 gram bid instead of once daily.

## 2017-07-04 NOTE — Telephone Encounter (Signed)
Tier Exception was denied. Pt called and spoke with insurance company and I have spoken with the insurance company. Pt is currently taking Miralax and Colace which works around the second day vs Movantik working right away. Pt says she will continue taking the Miralax and Colace.

## 2017-07-05 DIAGNOSIS — G894 Chronic pain syndrome: Secondary | ICD-10-CM | POA: Diagnosis not present

## 2017-07-05 DIAGNOSIS — M47812 Spondylosis without myelopathy or radiculopathy, cervical region: Secondary | ICD-10-CM | POA: Diagnosis not present

## 2017-07-05 DIAGNOSIS — M47816 Spondylosis without myelopathy or radiculopathy, lumbar region: Secondary | ICD-10-CM | POA: Diagnosis not present

## 2017-07-05 DIAGNOSIS — Z79891 Long term (current) use of opiate analgesic: Secondary | ICD-10-CM | POA: Diagnosis not present

## 2017-07-05 NOTE — Telephone Encounter (Signed)
Lmom, waiting on a return.

## 2017-07-05 NOTE — Telephone Encounter (Signed)
Lmom, pt is aware of LSL plan. Previously discussed with pt.

## 2017-07-27 DIAGNOSIS — M150 Primary generalized (osteo)arthritis: Secondary | ICD-10-CM | POA: Diagnosis not present

## 2017-07-27 DIAGNOSIS — M5431 Sciatica, right side: Secondary | ICD-10-CM | POA: Diagnosis not present

## 2017-07-27 DIAGNOSIS — Z681 Body mass index (BMI) 19 or less, adult: Secondary | ICD-10-CM | POA: Diagnosis not present

## 2017-07-27 DIAGNOSIS — R768 Other specified abnormal immunological findings in serum: Secondary | ICD-10-CM | POA: Diagnosis not present

## 2017-07-27 DIAGNOSIS — M545 Low back pain: Secondary | ICD-10-CM | POA: Diagnosis not present

## 2017-07-27 DIAGNOSIS — M792 Neuralgia and neuritis, unspecified: Secondary | ICD-10-CM | POA: Diagnosis not present

## 2017-07-27 DIAGNOSIS — M2550 Pain in unspecified joint: Secondary | ICD-10-CM | POA: Diagnosis not present

## 2017-07-28 ENCOUNTER — Other Ambulatory Visit: Payer: Self-pay | Admitting: Family Medicine

## 2017-08-02 ENCOUNTER — Other Ambulatory Visit: Payer: Self-pay

## 2017-08-02 NOTE — Progress Notes (Signed)
PATIENT ON RECALL FOR [facility] OV

## 2017-08-04 DIAGNOSIS — M47816 Spondylosis without myelopathy or radiculopathy, lumbar region: Secondary | ICD-10-CM | POA: Diagnosis not present

## 2017-08-04 DIAGNOSIS — M47812 Spondylosis without myelopathy or radiculopathy, cervical region: Secondary | ICD-10-CM | POA: Diagnosis not present

## 2017-08-04 DIAGNOSIS — G894 Chronic pain syndrome: Secondary | ICD-10-CM | POA: Diagnosis not present

## 2017-08-04 DIAGNOSIS — M150 Primary generalized (osteo)arthritis: Secondary | ICD-10-CM | POA: Diagnosis not present

## 2017-08-04 DIAGNOSIS — M25512 Pain in left shoulder: Secondary | ICD-10-CM | POA: Diagnosis not present

## 2017-08-15 ENCOUNTER — Encounter: Payer: Self-pay | Admitting: Family Medicine

## 2017-08-15 ENCOUNTER — Telehealth: Payer: Self-pay

## 2017-08-15 ENCOUNTER — Telehealth (INDEPENDENT_AMBULATORY_CARE_PROVIDER_SITE_OTHER): Payer: Self-pay

## 2017-08-15 ENCOUNTER — Ambulatory Visit (INDEPENDENT_AMBULATORY_CARE_PROVIDER_SITE_OTHER): Payer: Medicare Other | Admitting: Family Medicine

## 2017-08-15 VITALS — BP 122/74 | HR 73 | Resp 16 | Ht 65.0 in | Wt 115.0 lb

## 2017-08-15 DIAGNOSIS — F331 Major depressive disorder, recurrent, moderate: Secondary | ICD-10-CM

## 2017-08-15 DIAGNOSIS — F418 Other specified anxiety disorders: Secondary | ICD-10-CM

## 2017-08-15 DIAGNOSIS — R300 Dysuria: Secondary | ICD-10-CM

## 2017-08-15 DIAGNOSIS — I10 Essential (primary) hypertension: Secondary | ICD-10-CM

## 2017-08-15 DIAGNOSIS — R52 Pain, unspecified: Secondary | ICD-10-CM

## 2017-08-15 DIAGNOSIS — F411 Generalized anxiety disorder: Secondary | ICD-10-CM

## 2017-08-15 DIAGNOSIS — F5105 Insomnia due to other mental disorder: Secondary | ICD-10-CM | POA: Diagnosis not present

## 2017-08-15 LAB — POCT URINALYSIS DIPSTICK
APPEARANCE: NORMAL
BILIRUBIN UA: NEGATIVE
Glucose, UA: NEGATIVE
KETONES UA: NEGATIVE
NITRITE UA: NEGATIVE
ODOR: NORMAL
PH UA: 6 (ref 5.0–8.0)
PROTEIN UA: NEGATIVE
RBC UA: NEGATIVE
Spec Grav, UA: 1.015 (ref 1.010–1.025)
Urobilinogen, UA: 1 E.U./dL

## 2017-08-15 MED ORDER — MIRTAZAPINE 15 MG PO TABS: 15.0000 mg | ORAL_TABLET | Freq: Every day | ORAL | 1 refills | Status: DC

## 2017-08-15 NOTE — BH Specialist Note (Signed)
[place] Initial Clinical Assessment  MRN: [id number] NAME: Renee Waters Date: 08/15/17   Total time: 1 hour  Type of Contact: Type of Contact: Video Visit Followup Contact Patient consent obtained: Patient consent obtained for Virtual Visit: No Reason for Visit today: Reason for Your Call/Visit Today: Video [facility] Intake Assessment   Treatment History Patient recently received Inpatient Treatment: Have You Recently Been in Any Inpatient Treatment (Hospital/Detox/Crisis Center/28-Day Program)?: No  Facility/Program:  No   Date of discharge:  No  Patient currently being seen by therapist/psychiatrist: Yes - Glori Bickers, LCSW with [facility]   Patient currently receiving the following services: Patient Currently Receiving the Following Services:: Medication Management( PCP prescribes psychiatric medication )   Psychiatric History  Past Psychiatric History/Hospitalization(s): Anxiety: Yes Bipolar Disorder: No Depression: Yes Mania: No Psychosis: No Schizophrenia: No Personality Disorder: No Hospitalization for psychiatric illness: No History of Electroconvulsive Shock Therapy: No Prior Suicide Attempts: No Decreased need for sleep: No  Euphoria: No Self Injurious behaviors No Family History of mental illness: No Family History of substance abuse: No  Substance Abuse: No  DUI: No  Insomnia: No  History of violence No  Physical, sexual or emotional abuse:No  Prior outpatient mental health therapy: Yes - in may 2019 - patient was receiving services with Dr. Modesta Messing.       Clinical Assessment:  PHQ-9 Assessments: Depression screen [facility] 2/9 08/15/2017 08/15/2017 08/15/2017  Decreased Interest 3 3 2   Down, Depressed, Hopeless 3 3 2   PHQ - 2 Score 6 6 4   Altered sleeping 3 3 2   Tired, decreased energy 3 3 3   Change in appetite 2 3 3   Feeling bad or failure about yourself  3 3 0  Trouble concentrating 1 3 1   Moving slowly or  fidgety/restless 2 3 0  Suicidal thoughts 0 1 1  PHQ-9 Score 20 25 14   Difficult doing work/chores Extremely dIfficult - Very difficult  Some recent data might be hidden    GAD-7 Assessments: GAD 7 : Generalized Anxiety Score 08/15/2017  Nervous, Anxious, on Edge 3  Control/stop worrying 3  Worry too much - different things 3  Trouble relaxing 2  Restless 1  Easily annoyed or irritable 1  Afraid - awful might happen 3  Total GAD 7 Score 16  Anxiety Difficulty Very difficult     Social Functioning Social maturity: Social Maturity: Responsible Social judgement: Social Judgement: Normal  Stress Current stressors: Current Stressors: (Anxiety medication is not working; Feeling overwhelmed; Strained relationship with her husband) Familial stressors: Familial Stressors: None Sleep: Sleep: Difficulty staying asleep Appetite: Appetite: Decreased, Loss of appetite Coping ability: Coping ability: Normal  Patient taking medications as prescribed: Patient taking medications as prescribed: Yes  Current medications:  Outpatient Encounter Medications as of 08/15/2017  Medication Sig  . acyclovir (ZOVIRAX) 800 MG tablet Take 1 tablet by mouth 4 (four) times daily.  Marland Kitchen ALPRAZolam (XANAX) 0.5 MG tablet Take 1 tablet (0.5 mg total) by mouth at bedtime.  . Emu Oil OIL by Does not apply route. Takes 2 once a day  . EPINEPHRINE 0.3 mg/0.3 mL IJ SOAJ injection INJECT 0.3 MLS INTO MUSCLE ONCE AS NEEDED  . gabapentin (NEURONTIN) 600 MG tablet Take 600 mg by mouth as needed.  . hydrochlorothiazide (MICROZIDE) 12.5 MG capsule TAKE 1 CAPSULE BY MOUTH ONCE DAILY  . meloxicam (MOBIC) 7.5 MG tablet TAKE 1 TABLET BY MOUTH ONCE DAILY  . mirtazapine (REMERON) 15 MG tablet Take 1 tablet (  15 mg total) by mouth at bedtime.  . Multiple Vitamins-Minerals (MULTIVITAMINS THER. W/MINERALS) TABS Take 1 tablet by mouth daily.   . Oxycodone HCl 10 MG TABS Take 1 tablet (10 mg total) by mouth 4 (four) times daily. (Patient  taking differently: Take 10 mg by mouth 4 (four) times daily as needed. )  . pantoprazole (PROTONIX) 40 MG tablet Take 1 tablet (40 mg total) by mouth daily before breakfast.  . polyethylene glycol (MIRALAX / GLYCOLAX) packet 17 gm in 8 ounces water daily  . senna (SENOKOT) 8.6 MG TABS tablet Take 2 tablets by mouth daily as needed for mild constipation.  . temazepam (RESTORIL) 30 MG capsule Take 1 capsule (30 mg total) by mouth at bedtime as needed for sleep.  Marland Kitchen tiZANidine (ZANAFLEX) 4 MG tablet One tablet twice daily (Patient taking differently: as needed. )   No facility-administered encounter medications on file as of 2017/09/03.     Self-harm Behaviors Risk Assessment Self-harm risk factors: Self-harm risk factors: (None Reported) Patient endorses recent thoughts of harming self: Have you recently had any thoughts about harming yourself?: No    [place] Suicide Severity Rating Scale:  C-SRSS September 03, 2017  1. Wish to be Dead No  2. Suicidal Thoughts No  6. Suicide Behavior Question No    Danger to Others Risk Assessment Danger to others risk factors: Danger to Others Risk Factors: No risk factors noted Patient endorses recent thoughts of harming others: Notification required: No need or identified person    Substance Use Assessment Patient recently consumed alcohol: Have you recently consumed alcohol?: No  Alcohol Use Disorder Identification Test (AUDIT):  Alcohol Use Disorder Test (AUDIT) Sep 03, 2017  1. How often do you have a drink containing alcohol? 0  2. How many drinks containing alcohol do you have on a typical day when you are drinking? 0  3. How often do you have six or more drinks on one occasion? 0  AUDIT-C Score 0  Intervention/Follow-up AUDIT Score <7 follow-up not indicated   Patient recently used drugs: Have you recently used any drugs?: No Patient is concerned about dependence or abuse of substances: Does patient seem concerned about dependence or abuse of any  substance?: No    Goals, Interventions and Follow-up Plan Goals: Increase healthy adjustment to current life circumstances Interventions: Motivational Interviewing, Solution-Focused Strategies, Mindfulness or Psychologist, educational, Behavioral Activation, Brief CBT, Supportive Counseling and Medication Monitoring Follow-up Plan: [facility] Phone Follow Up  Summary of Clinical Assessment Summary:   Patient is a 72 year old female.  Patient was referred to [facility] because her psychiatric medication is not working.  Patient was tearful throughout the assessment.    Stressors: Strained relationship with her husband; Patient reports that her husband takes her for granted and he thinks about himself before her.  Patient reports that she wants her husband to pay more attention to her.   Patient reports that she always feels as if she is in a state of panic and there is noting going on.  Patient reports that her anxiety medication is not helping her to calm down.   Patient reports that she ha 4 adult children and 10 grandchildren.  Patient has been married for 30 years.  Patient reports that she is retired and lives at home with her husband and their dog.    Patient denies SI/HI/Pstychosis/Substance Abuse.   Patient was given the following information ( If your symptoms worsen or you have thoughts of suicide/homicide, [place].  You  may always call: National Suicide Hotline: 514-407-0296; [place] Crisis Line: 425 067 0256; Crisis Recovery in [place]: (267)410-8206.  These are available 24 hours a day, 7 days a week.  Patient denies prior inpatient psychiatric hospitalization.  Patient reports that she receives services with [facility] outpatient services with Dr Modesta Messing in May 2019.  Patient reports that she continues to receive outpatient therapy with Emeterio Reeve with [facility].  Patient reports that she wakes frequently throughout the night and her appetite  varies.   Patient reports spending time with her grandchildren for fun.     Graciella Freer [place], LCAS-A

## 2017-08-15 NOTE — Patient Instructions (Addendum)
F/U in 6 weeks, call if you need me before  New is Remeron one at bedtime for sleep and appetite  Try and get the help you can from your family  Continue with therapy  Call and speak with your son on a scheduled basis when business is taking a break  At least  twice per week  ENJOY your cruise

## 2017-08-15 NOTE — Progress Notes (Signed)
Renee Waters     MRN: [id number]      DOB: 1945-03-30   HPI Ms. Nygren is here for follow up and re-evaluation of chronic medical conditions, medication management and review of any available recent lab and radiology data.  Here today stating she is very angry about life in general, angry about her husband, states he is becoming increasingly selish, he does not care about anyone but himself, she just leaves notes, she has to leave notes asking him to do everything , she denies being suicidal or homicidal, but is overwhelmed , has no energy, no appetite, does not feel like eating and is losing weight. Can do very little because of pain, I asked about help from her 2 children who live in [place], one she says is able to help and does help on Fridays , when she is available, the other , she states is not helpful, neither is her son. 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 Her only positive report is about her son in [place] who she can speak with "at any time" however, he " works all the time" and the background noise  Blocks out conversation most of the time. She is looking forward to a cruise to [place] and other areas in the [place] in the next 3 weeks which he is taking her on At times she wonders why she is even living but has no plans either to commit suicide or homicide, though she wishes her husband would just go away a in her mind he is becoming increasingly selfish, states he barely speaks to her States that hr urine was discolored recently however after drinking increased amounts of water , this improved, she still requests that it be checked. She denies fever, flank pain, chills or suprapubic pain ROS Denies recent fever or chills. Denies sinus pressure, nasal congestion, ear pain or sore throat. Denies chest congestion, productive cough or wheezing. Denies chest  pains, palpitations and leg swelling Denies abdominal pain, nausea, vomiting,diarrhea or constipation.   C/o mild  Dysuria, denies  frequency, hesitancy or incontinence. C/o chronic  joint pain,  and limitation in mobility.  Denies skin break down or rash.   PE  BP 122/74   Pulse 73   Resp 16   Ht 5\' 5"  (1.651 m)   Wt 115 lb (52.2 kg)   SpO2 95%   BMI 19.14 kg/m   Patient alert and oriented , underweight, ill appearing, extremely depressed , flat affect, poor eye contact, crying and tearful   HEENT: No facial asymmetry, EOMI,   oropharynx pink and moist.  Neck decreased ROM no JVD, no mass.  Chest: Clear to auscultation bilaterally.  CVS: S1, S2 no murmurs, no S3.Regular rate.  ABD: Soft non tender.   Ext: No edema  MS: Decreased  ROM spine, shoulders, hips and knees. Ambulates with a cane Skin: Intact, no ulcerations or rash noted.  Psych:Poor eye contact, tearful, crying excessively at times, anxious and depressed denies suicidal or homicidal intent.  CNS: CN 2-12 intact, power,  normal throughout.no focal deficits noted.   Assessment & Plan  MDD (major depressive disorder), recurrent episode, moderate (HCC) Severe depression  With anorexia , weight loss and insomnia  in a patient with no family or social  network to support her who lives with a spouse  with whom she continues to become increasingly angry about living with and who she never/ very seldom  has ever expressed a good relationship. Denies active suicidal or homicidal ideation, however I do believe this is a very fragile situation overall and that she REQUIRES Psychiatric care for best outcome    Insomnia secondary to depression with anxiety Severe insomnia  With anorexia and weight loss due to depression, patient to start Remeron low dose and follow up in 6 weeks. She is to see therapist in office today, and to keep her in office appt with her regular Therapist, hopefully , she will re establish with  Psychiatry in the near future  Dysuria C/o orange colored urine and mild dysuria both of which have cleared up after a few days with increased water intake. CCUA shows only trace leukocytes, no further testing indicated, no fever, chills, flank or suprapubic tenderness  Essential hypertension Controlled, no change in medication

## 2017-08-18 ENCOUNTER — Telehealth: Payer: Self-pay | Admitting: Family Medicine

## 2017-08-18 NOTE — Telephone Encounter (Signed)
Patient is requesting a call to go over her urine test from mondays appt.

## 2017-08-20 ENCOUNTER — Inpatient Hospital Stay (HOSPITAL_COMMUNITY)
Admission: AD | Admit: 2017-08-20 | Discharge: 2017-08-22 | DRG: 445 | Disposition: A | Discharge status: Home / Self Care | Payer: Medicare Other | Source: Other Acute Inpatient Hospital | Attending: Internal Medicine | Admitting: Internal Medicine

## 2017-08-20 ENCOUNTER — Inpatient Hospital Stay (HOSPITAL_COMMUNITY): Payer: Medicare Other

## 2017-08-20 ENCOUNTER — Encounter (HOSPITAL_COMMUNITY): Payer: Self-pay | Admitting: *Deleted

## 2017-08-20 ENCOUNTER — Telehealth: Payer: Self-pay | Admitting: Internal Medicine

## 2017-08-20 ENCOUNTER — Other Ambulatory Visit: Payer: Self-pay

## 2017-08-20 DIAGNOSIS — K2270 Barrett's esophagus without dysplasia: Secondary | ICD-10-CM | POA: Diagnosis present

## 2017-08-20 DIAGNOSIS — E876 Hypokalemia: Secondary | ICD-10-CM | POA: Diagnosis not present

## 2017-08-20 DIAGNOSIS — R739 Hyperglycemia, unspecified: Secondary | ICD-10-CM | POA: Diagnosis present

## 2017-08-20 DIAGNOSIS — E440 Moderate protein-calorie malnutrition: Secondary | ICD-10-CM

## 2017-08-20 DIAGNOSIS — Z8249 Family history of ischemic heart disease and other diseases of the circulatory system: Secondary | ICD-10-CM | POA: Diagnosis not present

## 2017-08-20 DIAGNOSIS — R300 Dysuria: Secondary | ICD-10-CM | POA: Insufficient documentation

## 2017-08-20 DIAGNOSIS — R740 Nonspecific elevation of levels of transaminase and lactic acid dehydrogenase [LDH]: Secondary | ICD-10-CM | POA: Diagnosis present

## 2017-08-20 DIAGNOSIS — M329 Systemic lupus erythematosus, unspecified: Secondary | ICD-10-CM | POA: Diagnosis present

## 2017-08-20 DIAGNOSIS — Z87891 Personal history of nicotine dependence: Secondary | ICD-10-CM

## 2017-08-20 DIAGNOSIS — D649 Anemia, unspecified: Secondary | ICD-10-CM | POA: Diagnosis present

## 2017-08-20 DIAGNOSIS — K5900 Constipation, unspecified: Secondary | ICD-10-CM | POA: Diagnosis present

## 2017-08-20 DIAGNOSIS — K5903 Drug induced constipation: Secondary | ICD-10-CM | POA: Diagnosis not present

## 2017-08-20 DIAGNOSIS — Z96641 Presence of right artificial hip joint: Secondary | ICD-10-CM | POA: Diagnosis present

## 2017-08-20 DIAGNOSIS — M549 Dorsalgia, unspecified: Secondary | ICD-10-CM | POA: Diagnosis present

## 2017-08-20 DIAGNOSIS — K8309 Other cholangitis: Secondary | ICD-10-CM | POA: Diagnosis not present

## 2017-08-20 DIAGNOSIS — M25552 Pain in left hip: Secondary | ICD-10-CM | POA: Diagnosis present

## 2017-08-20 DIAGNOSIS — M1990 Unspecified osteoarthritis, unspecified site: Secondary | ICD-10-CM | POA: Diagnosis present

## 2017-08-20 DIAGNOSIS — F419 Anxiety disorder, unspecified: Secondary | ICD-10-CM | POA: Diagnosis present

## 2017-08-20 DIAGNOSIS — K219 Gastro-esophageal reflux disease without esophagitis: Secondary | ICD-10-CM | POA: Diagnosis present

## 2017-08-20 DIAGNOSIS — Z79891 Long term (current) use of opiate analgesic: Secondary | ICD-10-CM

## 2017-08-20 DIAGNOSIS — R0989 Other specified symptoms and signs involving the circulatory and respiratory systems: Secondary | ICD-10-CM | POA: Diagnosis present

## 2017-08-20 DIAGNOSIS — B009 Herpesviral infection, unspecified: Secondary | ICD-10-CM | POA: Diagnosis present

## 2017-08-20 DIAGNOSIS — R935 Abnormal findings on diagnostic imaging of other abdominal regions, including retroperitoneum: Secondary | ICD-10-CM | POA: Diagnosis not present

## 2017-08-20 DIAGNOSIS — R109 Unspecified abdominal pain: Secondary | ICD-10-CM | POA: Diagnosis not present

## 2017-08-20 DIAGNOSIS — K56600 Partial intestinal obstruction, unspecified as to cause: Secondary | ICD-10-CM | POA: Diagnosis present

## 2017-08-20 DIAGNOSIS — F331 Major depressive disorder, recurrent, moderate: Secondary | ICD-10-CM | POA: Diagnosis present

## 2017-08-20 DIAGNOSIS — Z79899 Other long term (current) drug therapy: Secondary | ICD-10-CM | POA: Diagnosis not present

## 2017-08-20 DIAGNOSIS — Z9049 Acquired absence of other specified parts of digestive tract: Secondary | ICD-10-CM | POA: Diagnosis not present

## 2017-08-20 DIAGNOSIS — Z86010 Personal history of colonic polyps: Secondary | ICD-10-CM | POA: Diagnosis not present

## 2017-08-20 DIAGNOSIS — Z791 Long term (current) use of non-steroidal anti-inflammatories (NSAID): Secondary | ICD-10-CM | POA: Diagnosis not present

## 2017-08-20 DIAGNOSIS — N390 Urinary tract infection, site not specified: Secondary | ICD-10-CM | POA: Diagnosis present

## 2017-08-20 DIAGNOSIS — K8030 Calculus of bile duct with cholangitis, unspecified, without obstruction: Secondary | ICD-10-CM | POA: Diagnosis present

## 2017-08-20 DIAGNOSIS — B952 Enterococcus as the cause of diseases classified elsewhere: Secondary | ICD-10-CM | POA: Diagnosis not present

## 2017-08-20 DIAGNOSIS — K565 Intestinal adhesions [bands], unspecified as to partial versus complete obstruction: Secondary | ICD-10-CM | POA: Diagnosis present

## 2017-08-20 DIAGNOSIS — I10 Essential (primary) hypertension: Secondary | ICD-10-CM | POA: Diagnosis present

## 2017-08-20 DIAGNOSIS — Z681 Body mass index (BMI) 19 or less, adult: Secondary | ICD-10-CM

## 2017-08-20 DIAGNOSIS — Z91030 Bee allergy status: Secondary | ICD-10-CM

## 2017-08-20 DIAGNOSIS — R932 Abnormal findings on diagnostic imaging of liver and biliary tract: Secondary | ICD-10-CM | POA: Diagnosis not present

## 2017-08-20 DIAGNOSIS — K8032 Calculus of bile duct with acute cholangitis without obstruction: Principal | ICD-10-CM | POA: Diagnosis present

## 2017-08-20 DIAGNOSIS — R1013 Epigastric pain: Secondary | ICD-10-CM | POA: Diagnosis not present

## 2017-08-20 DIAGNOSIS — Z885 Allergy status to narcotic agent status: Secondary | ICD-10-CM

## 2017-08-20 DIAGNOSIS — R1011 Right upper quadrant pain: Secondary | ICD-10-CM | POA: Diagnosis not present

## 2017-08-20 DIAGNOSIS — K810 Acute cholecystitis: Secondary | ICD-10-CM | POA: Diagnosis not present

## 2017-08-20 HISTORY — DX: Systemic lupus erythematosus, unspecified: M32.9

## 2017-08-20 HISTORY — DX: Myoneural disorder, unspecified: G70.9

## 2017-08-20 LAB — CBC WITH DIFFERENTIAL/PLATELET
Abs Immature Granulocytes: 0 10*3/uL (ref 0.0–0.1)
BASOS ABS: 0.1 10*3/uL (ref 0.0–0.1)
Basophils Relative: 1 %
EOS PCT: 1 %
Eosinophils Absolute: 0.1 10*3/uL (ref 0.0–0.7)
HCT: 35.3 % — ABNORMAL LOW (ref 36.0–46.0)
HEMOGLOBIN: 11.5 g/dL — AB (ref 12.0–15.0)
Immature Granulocytes: 0 %
LYMPHS PCT: 11 %
Lymphs Abs: 1.1 10*3/uL (ref 0.7–4.0)
MCH: 30.8 pg (ref 26.0–34.0)
MCHC: 32.6 g/dL (ref 30.0–36.0)
MCV: 94.6 fL (ref 78.0–100.0)
Monocytes Absolute: 0.5 10*3/uL (ref 0.1–1.0)
Monocytes Relative: 5 %
Neutro Abs: 7.9 10*3/uL — ABNORMAL HIGH (ref 1.7–7.7)
Neutrophils Relative %: 82 %
Platelets: 238 10*3/uL (ref 150–400)
RBC: 3.73 MIL/uL — ABNORMAL LOW (ref 3.87–5.11)
RDW: 14.6 % (ref 11.5–15.5)
WBC: 9.8 10*3/uL (ref 4.0–10.5)

## 2017-08-20 LAB — URINALYSIS, ROUTINE W REFLEX MICROSCOPIC
Bacteria, UA: NONE SEEN
Bilirubin Urine: NEGATIVE
GLUCOSE, UA: NEGATIVE mg/dL
KETONES UR: NEGATIVE mg/dL
NITRITE: NEGATIVE
PH: 6 (ref 5.0–8.0)
Protein, ur: NEGATIVE mg/dL
SPECIFIC GRAVITY, URINE: 1.028 (ref 1.005–1.030)

## 2017-08-20 LAB — COMPREHENSIVE METABOLIC PANEL
ALBUMIN: 2.7 g/dL — AB (ref 3.5–5.0)
ALT: 61 U/L — AB (ref 0–44)
AST: 69 U/L — AB (ref 15–41)
Alkaline Phosphatase: 371 U/L — ABNORMAL HIGH (ref 38–126)
Anion gap: 9 (ref 5–15)
BUN: 14 mg/dL (ref 8–23)
CHLORIDE: 109 mmol/L (ref 98–111)
CO2: 21 mmol/L — ABNORMAL LOW (ref 22–32)
CREATININE: 1.08 mg/dL — AB (ref 0.44–1.00)
Calcium: 7.9 mg/dL — ABNORMAL LOW (ref 8.9–10.3)
GFR calc Af Amer: 58 mL/min — ABNORMAL LOW (ref >60)
GFR, EST NON AFRICAN AMERICAN: 50 mL/min — AB (ref >60)
Glucose, Bld: 83 mg/dL (ref 70–99)
Potassium: 4.4 mmol/L (ref 3.5–5.1)
Sodium: 139 mmol/L (ref 135–145)
TOTAL PROTEIN: 6.8 g/dL (ref 6.5–8.1)
Total Bilirubin: 2.6 mg/dL — ABNORMAL HIGH (ref 0.3–1.2)

## 2017-08-20 LAB — PROTIME-INR
INR: 1.35
PROTHROMBIN TIME: 16.6 s — AB (ref 11.4–15.2)

## 2017-08-20 LAB — APTT: aPTT: 41 seconds — ABNORMAL HIGH (ref 24–36)

## 2017-08-20 LAB — MAGNESIUM: MAGNESIUM: 2.3 mg/dL (ref 1.7–2.4)

## 2017-08-20 MED ORDER — ALPRAZOLAM 0.5 MG PO TABS
0.5000 mg | ORAL_TABLET | Freq: Once | ORAL | Status: DC
  Filled 2017-08-20: qty 1

## 2017-08-20 MED ORDER — MIRTAZAPINE 15 MG PO TABS
15.0000 mg | ORAL_TABLET | Freq: Every day | ORAL | Status: DC
  Filled 2017-08-20: qty 1

## 2017-08-20 MED ORDER — ALPRAZOLAM 0.5 MG PO TABS
0.5000 mg | ORAL_TABLET | Freq: Every day | ORAL | Status: DC
  Administered 2017-08-21: 0.5 mg via ORAL
  Filled 2017-08-20 (×2): qty 1

## 2017-08-20 MED ORDER — POTASSIUM CHLORIDE 10 MEQ/100ML IV SOLN
10.0000 meq | INTRAVENOUS | Status: DC
  Filled 2017-08-20: qty 100

## 2017-08-20 MED ORDER — TIZANIDINE HCL 4 MG PO TABS
4.0000 mg | ORAL_TABLET | Freq: Two times a day (BID) | ORAL | Status: DC | PRN
  Administered 2017-08-20: 4 mg via ORAL
  Filled 2017-08-20: qty 1

## 2017-08-20 MED ORDER — GADOBENATE DIMEGLUMINE 529 MG/ML IV SOLN
5.0000 mL | Freq: Once | INTRAVENOUS | Status: AC | PRN
  Administered 2017-08-20: 5 mL via INTRAVENOUS

## 2017-08-20 MED ORDER — ALPRAZOLAM 0.5 MG PO TABS
0.5000 mg | ORAL_TABLET | Freq: Once | ORAL | Status: AC
  Administered 2017-08-20: 1 mg via ORAL
  Filled 2017-08-20: qty 2

## 2017-08-20 MED ORDER — SODIUM CHLORIDE 0.9% FLUSH
3.0000 mL | Freq: Two times a day (BID) | INTRAVENOUS | Status: DC
  Administered 2017-08-20 – 2017-08-22 (×4): 3 mL via INTRAVENOUS

## 2017-08-20 MED ORDER — HYDROCHLOROTHIAZIDE 12.5 MG PO CAPS
12.5000 mg | ORAL_CAPSULE | Freq: Every day | ORAL | Status: DC
  Administered 2017-08-21: 12.5 mg via ORAL
  Filled 2017-08-20: qty 1

## 2017-08-20 MED ORDER — ONDANSETRON HCL 4 MG/2ML IJ SOLN: 4.0000 mg | Freq: Four times a day (QID) | INTRAMUSCULAR | Status: DC | PRN

## 2017-08-20 MED ORDER — PANTOPRAZOLE SODIUM 40 MG PO TBEC
40.0000 mg | DELAYED_RELEASE_TABLET | Freq: Every day | ORAL | Status: DC
  Administered 2017-08-21 – 2017-08-22 (×2): 40 mg via ORAL
  Filled 2017-08-20 (×3): qty 1

## 2017-08-20 MED ORDER — KETOROLAC TROMETHAMINE 15 MG/ML IJ SOLN
15.0000 mg | Freq: Four times a day (QID) | INTRAMUSCULAR | Status: DC | PRN
  Administered 2017-08-20 – 2017-08-21 (×3): 15 mg via INTRAVENOUS
  Filled 2017-08-20 (×3): qty 1

## 2017-08-20 MED ORDER — POTASSIUM CHLORIDE IN NACL 20-0.9 MEQ/L-% IV SOLN
INTRAVENOUS | Status: AC
  Administered 2017-08-20 – 2017-08-21 (×4): via INTRAVENOUS
  Filled 2017-08-20 (×4): qty 1000

## 2017-08-20 MED ORDER — SENNA 8.6 MG PO TABS
2.0000 | ORAL_TABLET | Freq: Every day | ORAL | Status: DC
  Administered 2017-08-21: 17.2 mg via ORAL
  Filled 2017-08-20 (×2): qty 2

## 2017-08-20 MED ORDER — TRAMADOL HCL 50 MG PO TABS
50.0000 mg | ORAL_TABLET | Freq: Four times a day (QID) | ORAL | Status: DC | PRN
  Administered 2017-08-20 – 2017-08-21 (×2): 50 mg via ORAL
  Filled 2017-08-20 (×2): qty 1

## 2017-08-20 MED ORDER — ENOXAPARIN SODIUM 40 MG/0.4ML ~~LOC~~ SOLN
40.0000 mg | SUBCUTANEOUS | Status: DC
  Administered 2017-08-20 – 2017-08-22 (×3): 40 mg via SUBCUTANEOUS
  Filled 2017-08-20 (×3): qty 0.4

## 2017-08-20 MED ORDER — TEMAZEPAM 15 MG PO CAPS
30.0000 mg | ORAL_CAPSULE | Freq: Every evening | ORAL | Status: DC | PRN
  Administered 2017-08-20 – 2017-08-21 (×2): 30 mg via ORAL
  Filled 2017-08-20 (×3): qty 2

## 2017-08-20 MED ORDER — SODIUM CHLORIDE 0.9 % IV SOLN
1.0000 g | INTRAVENOUS | Status: DC
  Administered 2017-08-20 – 2017-08-22 (×2): 1 g via INTRAVENOUS
  Filled 2017-08-20 (×4): qty 10

## 2017-08-20 MED ORDER — ONDANSETRON HCL 4 MG PO TABS: 4.0000 mg | ORAL_TABLET | Freq: Four times a day (QID) | ORAL | Status: DC | PRN

## 2017-08-20 NOTE — Telephone Encounter (Signed)
Called for transfer from [facility] by Dr. Areta Haber.  72 yo F with epigastric abd pain.  Fever 102.  Dark urine for past couple of days.  Has h/o cholecystectomy in past.  Work up shows Bili 2.5.  Alk phos 550.  AST and ALT elevated.  CT scan negative though.  EDP first spoke with Dr. Watt Climes who thought patient might need ERCP and wanted patient transferred here for that and hospitalist to admit.  Got rocephin 1gm as instructed by Dr. Watt Climes.  Fever resolved post tylenol.  Vitals now reportedly normal.  Will accept for tele bed.

## 2017-08-20 NOTE — Telephone Encounter (Signed)
Currently hospitalized with cholangitis, her urine repeated was not suggestive of infection per admit note

## 2017-08-20 NOTE — Assessment & Plan Note (Signed)
Controlled, no change in medication  

## 2017-08-20 NOTE — H&P (Signed)
History and Physical    Renee Waters RNH:[id number] DOB: 04-14-1945 DOA: 08/20/2017  PCP: Renee Helper, MD  Patient coming from: [place] have personally briefly reviewed patient's old medical records in [place]  Chief Complaint: Fever chills since last night and abdominal pain the beginning of July  HPI: Renee Waters is a 72 y.o. female with medical history significant of lupus erythematosus on chronic pain medication, recent partial small bowel obstruction due to pain medications, major depression, anemia, anxiety, osteoarthritis status post right total hip replacement, hypertension, esophageal reflux disease who is status post cholecystectomy who presented to the emergency department at [facility] complaining of fevers and chills with associated abdominal pain.  Dates that she was well until early July when she developed severe left flank and back pain which she thought was related to her hip.  She saw the rheumatologist who had adjusted medications but that was not helpful.  Last night she started to feel chills and developed a fever initially her temperature was 101 at home.  She took her blood pressure rechecked her temperature an hour later and it was up to 103 at which point her husband encouraged her to go to the emergency department.  Emergency department at [facility] was febrile to 102.9 had tender abdominal examination laboratory data revealed hypokalemia, hyperglycemia, evaded LFTs with a total bili of 2.6, AST is 79, ALT of 68, and alk phos of 556.  Her white blood cell count was slightly elevated at 11.4.  Her hemoglobin was stable at 11.4.  Due to concerns for cholangitis GI was contacted here and she was accepted for transfer.  A CT scan of the abdomen was obtained which showed moderate colonic stool burden without evidence of bowel obstruction.  Her lactate was negative.  Is given 1 g of IV ceftriaxone at approximately 1:25 AM this into a dose of  acetaminophen, Benadryl, and.  Her potassium was noted low and she was given 20 mEq of oral potassium.  I saw her upon arrival to our facility and she stating she is feeling somewhat better.  She becomes very easily tearful as this has been extremely stressful for her.  Will be admitted to the hospital for valuation and management of cholangitis due to retained common bile duct stone.  Patient has been accepted by Dr. Loni Beckwith and we expect that she will undergo ERCP.  Will also order MRCP   Review of Systems: As per HPI otherwise all other systems reviewed and  negative.    Past Medical History:  Diagnosis Date  . Allergy   . Anemia   . Anxiety   . Barrett's esophagus   . Cataract   . Chronic back pain   . Chronic neck pain   . Depression   . Genital herpes   . GERD (gastroesophageal reflux disease)   . History of blood transfusion   . Hypertension   . Insomnia   . Lupus (systemic lupus erythematosus) ([place])   . Neuromuscular disorder ([place])   . Osteoarthritis   . S/P colonoscopy June 2005   normal, no polyps  . S/P endoscopy June 2005, Oct 2009   2005: short-segment Barrett's, 2009: short-segment Barrett's  . UTI (lower urinary tract infection) 10/14   currently on Penicillin-  states is improving    Past Surgical History:  Procedure Laterality Date  . ABDOMINAL HYSTERECTOMY    . BACK SURGERY    . BIOPSY N/A 03/20/2014   Procedure: BIOPSY;  Surgeon: Daneil Dolin, MD;  Location: [facility];  Service: Endoscopy;  Laterality: N/A;  . BIOPSY  09/14/2015   Procedure: BIOPSY;  Surgeon: Daneil Dolin, MD;  [facility];  Service: Endoscopy;;  esophageal and gastric  . CARPAL TUNNEL RELEASE Left 2013  . cervical disectomy  2002  . CHOLECYSTECTOMY     with lysis of adhesions for sbo; "ruptured gallbladder".  . COLONOSCOPY  11/09/2011   RMR: Melanosis coli  . COLONOSCOPY WITH PROPOFOL N/A 09/14/2015   Dr. Gala Romney: diverticulosis, 84m TA removed. next TCS 09/2020.   . DENTAL  SURGERY  11/2015   multiple tooth extraction  . ESOPHAGOGASTRODUODENOSCOPY  11/29/2007   salmon-colored  tongue   longest stable at  3 cm, distal esophagus as described previously status post biopsy/ Hiatal hernia, otherwise normal stomach D1 and D2  . ESOPHAGOGASTRODUODENOSCOPY  01/06/11   short segment Barrett's esophagus s/p bx/Hiatal hernia  . ESOPHAGOGASTRODUODENOSCOPY (EGD) WITH PROPOFOL N/A 03/20/2014   RSWN:IOEVOJJKdistal esophagus short segment barrett's, bx with no dysplasia. next egd in 03/2017  . ESOPHAGOGASTRODUODENOSCOPY (EGD) WITH PROPOFOL N/A 09/14/2015   Dr. RGala Romney Barrett's without dysplasia, gastritis benign bx, hiatal hernia. next EGD 09/2018.  .Marland KitchenHERNIA REPAIR Right 07/2010   Dr. BZada Girt . JOINT REPLACEMENT    . [facility]   [facility] . POLYPECTOMY  09/14/2015   Procedure: POLYPECTOMY;  Surgeon: RDaneil Dolin MD;  [facility];  Service: Endoscopy;;  ascending colon  . right hip replacement  07/2010   went back in sept 2012 to fix  . SHOULDER ARTHROSCOPY  2008   left  . TOTAL HIP REVISION Right 12/17/2012   Procedure: RIGHT TOTAL HIP REVISION;  Surgeon: MMauri Pole MD;  [facility];  Service: Orthopedics;  Laterality: Right;  . WRIST SURGERY Right 2011   open reduction right wrist.    Social History   Social History Narrative  . Not on file     reports that she quit smoking about 14 years ago. Her smoking use included cigarettes. She has a 6.25 pack-year smoking history. She has never used smokeless tobacco. She reports that she does not drink alcohol or use drugs.  Allergies  Allergen Reactions  . Bee Venom Swelling  . Oxycodone-Acetaminophen Rash    Pt states, "this gives her a rash, but at home she takes oxycodone for pain relief"    Family History  Problem Relation Age of Onset  . Hypertension Mother   . Stroke Mother   . Colon cancer Neg Hx   . Anesthesia problems Neg Hx   . Hypotension Neg Hx   .  Malignant hyperthermia Neg Hx   . Pseudochol deficiency Neg Hx   . Gastric cancer Neg Hx   . Esophageal cancer Neg Hx      Prior to Admission medications   Medication Sig Start Date End Date Taking? Authorizing Provider  acyclovir (ZOVIRAX) 800 MG tablet Take 1 tablet by mouth 4 (four) times daily.   Yes [provider]  ALPRAZolam (XANAX) 0.5 MG tablet Take 1 tablet (0.5 mg total) by mouth at bedtime. 05/05/17  Yes SFayrene Helper MD  EPINEPHRINE 0.3 mg/0.3 mL IJ SOAJ injection INJECT 0.3 MLS INTO MUSCLE ONCE AS NEEDED 10/04/16  Yes SFayrene Helper MD  gabapentin (NEURONTIN) 600 MG tablet Take 300 mg by mouth as needed.    Yes [provider]  hydrochlorothiazide (MICROZIDE) 12.5 MG capsule TAKE 1 CAPSULE  BY MOUTH ONCE DAILY Patient taking differently: TAKE 1 CAPSULE (12.37m)BY MOUTH ONCE DAILY 07/31/17  Yes SFayrene Helper MD  meloxicam (MOBIC) 7.5 MG tablet TAKE 1 TABLET BY MOUTH ONCE DAILY Patient taking differently: TAKE 1 TABLET(7.522m BY MOUTH ONCE DAILY 02/15/17  Yes HaCarole CivilMD  mirtazapine (REMERON) 15 MG tablet Take 1 tablet (15 mg total) by mouth at bedtime. 08/15/17  Yes SiFayrene HelperMD  Multiple Vitamins-Minerals (MULTIVITAMINS THER. W/MINERALS) TABS Take 1 tablet by mouth daily.    Yes [provider]  omeprazole (PRILOSEC) 40 MG capsule Take 40 mg by mouth daily.   Yes [provider]  Oxycodone HCl 10 MG TABS Take 1 tablet (10 mg total) by mouth 4 (four) times daily. Patient taking differently: Take 10 mg by mouth every 6 (six) hours as needed (pain).  02/23/16  Yes SiFayrene HelperMD  pantoprazole (PROTONIX) 40 MG tablet Take 1 tablet (40 mg total) by mouth daily before breakfast. 01/18/16  Yes SiFayrene HelperMD  polyethylene glycol ([facility] GLFloria Ravelingpacket 17 gm in 8 ounces water daily 06/01/17 06/02/18 Yes SiFayrene HelperMD  senna (SENOKOT) 8.6 MG TABS tablet Take 2 tablets by mouth daily as  needed for mild constipation.   Yes [provider]  temazepam (RESTORIL) 30 MG capsule Take 1 capsule (30 mg total) by mouth at bedtime as needed for sleep. 05/29/17  Yes SiFayrene HelperMD  tiZANidine (ZANAFLEX) 4 MG tablet One tablet twice daily Patient taking differently: Take 4 mg by mouth 2 (two) times daily as needed for muscle spasms.  08/02/16  Yes SiFayrene HelperMD    Physical Exam:  Constitutional: NAD, comfortable but becomes tearful frequently during interview Vitals:   08/20/17 1010  BP: 110/72  Pulse: 63  Resp: 14  Temp: 97.7 F (36.5 C)  SpO2: 100%  Weight: 54.2 kg (119 lb 7.8 oz)  Height: _0  (1.651 m)   Eyes: PERRL, lids and conjunctivae normal ENMT: Mucous membranes are moist. Posterior pharynx clear of any exudate or lesions.Normal dentition.  Neck: normal, supple, no masses, no thyromegaly Respiratory: clear to auscultation bilaterally, no wheezing, no crackles. Normal respiratory effort. No accessory muscle use.  Cardiovascular: Regular rate and rhythm, no murmurs / rubs / gallops. No extremity edema. 2+ pedal pulses. No carotid bruits.  Abdomen: Moderate diffuse abdominal tenderness, no masses palpated. No hepatosplenomegaly. Bowel sounds positive.  No rebound minimal guarding Musculoskeletal: no clubbing / cyanosis. No joint deformity upper and lower extremities. Good ROM, no contractures. Normal muscle tone.  Skin: no rashes, lesions, ulcers. No induration Neurologic: CN 2-12 grossly intact. Sensation intact, DTR normal. Strength 5/5 in all 4.  Psychiatric: Normal judgment and insight. Alert and oriented x 3. Normal mood.    Labs on Admission: I have personally reviewed following labs and imaging studies This: Reveals only trace leukocytes is otherwise negative performed on 08/15/2017  [facility] described above and reviewed personally by me.   Radiological Exams on Admission: [facility] mild swirling of the lower mesentery without evidence of bowel obstruction.  She has a moderate colonic stool burden.  Aortic atherosclerosis.  EKG: Independently reviewed.  Sinus rhythm with first-degree AV block at 61 bpm.  Assessment/Plan Principal Problem:   Acute cholangitis Active Problems:   Hypokalemia   Constipation   Systemic lupus (HCC)   MDD (major depressive disorder), recurrent episode, moderate (HCPickens  Hyperglycemia   Western blot positive HSV2   Essential hypertension   GERD (gastroesophageal reflux disease)   Anemia, normocytic normochromic   1.  Acute cholangitis: 72 year old female with history of cystectomy presents with elevated bilirubin, elevated alk phos and elevated LFTs in the setting of fever.  Is highly suggestive of acute cholangitis.  CT scan did not reveal any dilated ducts but this is really not a good test for this.  I have ordered an MRCP at the request of Dr. Loni Beckwith.  He will see the patient.  She may require ERCP.  In the meantime I am going to order gentle IV fluids and pain medications as needed.  She will be n.p.o. except for meds.  She has received 1 g of ceftriaxone at the emergency department at [facility].  We will 10 you ceftriaxone dosing.  2.  Hypokalemia: She will receive potassium supplementation via IV fluids last 2 runs of 10 mEq of potassium.  3.  Constipation: This is very significant and must be treated in this patient as she has recently had a small bowel obstruction due to her narcotic medications.  At that time her medications had been changed to a different one (morphine).  She usually until that point had been on oxycodone.  Oxycodone has been resumed however CT scan does reveal moderate stool burden.  At this point will start her on Senokot 2 tablets twice daily as well as relax.  If this is not sufficient she may require new or more of agents that help with narcotic related constipation.  4.  Systemic lupus:  Patient is followed by her rheumatologist.  She takes chronic narcotics for this pain associated with lupus.  Currently renal function appears stable with no evidence of lupus nephritis.  5.  Major depressive disorder recurrent episode moderate: She is being followed very closely by her primary care physician.  She is on numerous medications which we will continue while here in the hospital.  6.  Hyperglycemia: We will monitor fingerstick blood glucoses: This may just be an acute phase reaction due to acute infection.  She has been started on ceftriaxone which we will continue.  7.  Western blot positive HSV-2: Patient has a as needed order for acyclovir which she will take 5 times daily as needed.  I will not order this while she is in the hospital but she has a flare will need to restart it.  8.  Essential hypertension: Hydrochlorothiazide.  9.  Normochromic normocytic anemia: Likely related to chronic inflammation from systemic lupus erythematosus.  Follow-up with primary care physician.  DVT prophylaxis: Lovenox Code Status: Full code Family Communication: Spoke with patient's 3 daughters were present at the time of admission.  Patient retains capacity. Disposition Plan: Likely home in 3 to 4 days Consults called: Spoke with Dr. Loni Beckwith from GI Admission status: Inpatient   Lady Deutscher MD [facility] Pager [phone]  If 7PM-7AM, please contact night-coverage [email or web address] Password [facility]  08/20/2017, 11:58 AM

## 2017-08-20 NOTE — Assessment & Plan Note (Signed)
Severe insomnia  With anorexia and weight loss due to depression, patient to start Remeron low dose and follow up in 6 weeks. She is to see therapist in office today, and to keep her in office appt with her regular Therapist, hopefully , she will re establish with Psychiatry in the near future

## 2017-08-20 NOTE — Assessment & Plan Note (Signed)
C/o orange colored urine and mild dysuria both of which have cleared up after a few days with increased water intake. CCUA shows only trace leukocytes, no further testing indicated, no fever, chills, flank or suprapubic tenderness

## 2017-08-20 NOTE — Assessment & Plan Note (Signed)
Severe depression  With anorexia , weight loss and insomnia  in a patient with no family or social  network to support her who lives with a spouse with whom she continues to become increasingly angry about living with and who she never/ very seldom  has ever expressed a good relationship. Denies active suicidal or homicidal ideation, however I do believe this is a very fragile situation overall and that she REQUIRES Psychiatric care for best outcome

## 2017-08-20 NOTE — Consult Note (Signed)
Reason for Consult:questionable cholangitis Referring Physician: Hospital team  Renee Waters is an 72 y.o. female.  HPI: patient seen and examined and discussed with 2 daughters and her hospital computer chart reviewed and her case discussed with the hospital team as well as the eating ER physician and her open cholecystectomy surgery 2 years ago at [facility] was discussed and it doesn't sound like she had stones but did have a significant open operation but does not remember needing a endoscopy and she's had some left hip and side pain for some time which she thought was arthritis but last night started having fever chills vomiting and more midepigastric discomfort and when she presented to the emergency room liver tests were increased as was her white count and she's never been told her liver tests were up before and her previous GI workup in [place] was reviewed and currently she is feeling better  Past Medical History:  Diagnosis Date  . Allergy   . Anemia   . Anxiety   . Barrett's esophagus   . Cataract   . Chronic back pain   . Chronic neck pain   . Depression   . Genital herpes   . GERD (gastroesophageal reflux disease)   . History of blood transfusion   . Hypertension   . Insomnia   . Lupus (systemic lupus erythematosus) ([place])   . Neuromuscular disorder ([place])   . Osteoarthritis   . S/P colonoscopy June 2005   normal, no polyps  . S/P endoscopy June 2005, Oct 2009   2005: short-segment Barrett's, 2009: short-segment Barrett's  . UTI (lower urinary tract infection) 10/14   currently on Penicillin-  states is improving    Past Surgical History:  Procedure Laterality Date  . ABDOMINAL HYSTERECTOMY    . BACK SURGERY    . BIOPSY N/A 03/20/2014   Procedure: BIOPSY;  Surgeon: Daneil Dolin, MD;  Location: [facility];  Service: Endoscopy;  Laterality: N/A;  . BIOPSY  09/14/2015   Procedure: BIOPSY;  Surgeon: Daneil Dolin, MD;  [facility];  Service: Endoscopy;;   esophageal and gastric  . CARPAL TUNNEL RELEASE Left 2013  . cervical disectomy  2002  . CHOLECYSTECTOMY     with lysis of adhesions for sbo; "ruptured gallbladder".  . COLONOSCOPY  11/09/2011   RMR: Melanosis coli  . COLONOSCOPY WITH PROPOFOL N/A 09/14/2015   Dr. Gala Romney: diverticulosis, 28m TA removed. next TCS 09/2020.   . DENTAL SURGERY  11/2015   multiple tooth extraction  . ESOPHAGOGASTRODUODENOSCOPY  11/29/2007   salmon-colored  tongue   longest stable at  3 cm, distal esophagus as described previously status post biopsy/ Hiatal hernia, otherwise normal stomach D1 and D2  . ESOPHAGOGASTRODUODENOSCOPY  01/06/11   short segment Barrett's esophagus s/p bx/Hiatal hernia  . ESOPHAGOGASTRODUODENOSCOPY (EGD) WITH PROPOFOL N/A 03/20/2014   RVWU:JWJXBJYNdistal esophagus short segment barrett's, bx with no dysplasia. next egd in 03/2017  . ESOPHAGOGASTRODUODENOSCOPY (EGD) WITH PROPOFOL N/A 09/14/2015   Dr. RGala Romney Barrett's without dysplasia, gastritis benign bx, hiatal hernia. next EGD 09/2018.  .Marland KitchenHERNIA REPAIR Right 07/2010   Dr. BZada Girt . JOINT REPLACEMENT    . [facility]   [facility] . POLYPECTOMY  09/14/2015   Procedure: POLYPECTOMY;  Surgeon: RDaneil Dolin MD;  [facility];  Service: Endoscopy;;  ascending colon  . right hip replacement  07/2010   went back in sept 2012 to fix  . SHOULDER ARTHROSCOPY  2008  left  . TOTAL HIP REVISION Right 12/17/2012   Procedure: RIGHT TOTAL HIP REVISION;  Surgeon: Mauri Pole, MD;  Location[facility];  Service: Orthopedics;  Laterality: Right;  . WRIST SURGERY Right 2011   open reduction right wrist.    Family History  Problem Relation Age of Onset  . Hypertension Mother   . Stroke Mother   . Colon cancer Neg Hx   . Anesthesia problems Neg Hx   . Hypotension Neg Hx   . Malignant hyperthermia Neg Hx   . Pseudochol deficiency Neg Hx   . Gastric cancer Neg Hx   . Esophageal cancer Neg Hx     Social History:   reports that she quit smoking about 14 years ago. Her smoking use included cigarettes. She has a 6.25 pack-year smoking history. She has never used smokeless tobacco. She reports that she does not drink alcohol or use drugs.  Allergies:  Allergies  Allergen Reactions  . Bee Venom Swelling  . Oxycodone-Acetaminophen Rash    Pt states, "this gives her a rash, but at home she takes oxycodone for pain relief"    Medications: I have reviewed the patient's current medications.  Results for orders placed or performed during the hospital encounter of 08/20/17 (from the past 48 hour(s))  Comprehensive metabolic panel     Status: Abnormal   Collection Time: 08/20/17 12:01 PM  Result Value Ref Range   Sodium 139 135 - 145 mmol/L   Potassium 4.4 3.5 - 5.1 mmol/L   Chloride 109 98 - 111 mmol/L    Comment: Please note change in reference range.   CO2 21 (L) 22 - 32 mmol/L   Glucose, Bld 83 70 - 99 mg/dL    Comment: Please note change in reference range.   BUN 14 8 - 23 mg/dL    Comment: Please note change in reference range.   Creatinine, Ser 1.08 (H) 0.44 - 1.00 mg/dL   Calcium 7.9 (L) 8.9 - 10.3 mg/dL   Total Protein 6.8 6.5 - 8.1 g/dL   Albumin 2.7 (L) 3.5 - 5.0 g/dL   AST 69 (H) 15 - 41 U/L   ALT 61 (H) 0 - 44 U/L    Comment: Please note change in reference range.   Alkaline Phosphatase 371 (H) 38 - 126 U/L   Total Bilirubin 2.6 (H) 0.3 - 1.2 mg/dL   GFR calc non Af Amer 50 (L) >60 mL/min   GFR calc Af Amer 58 (L) >60 mL/min    Comment: (NOTE) The eGFR has been calculated using the CKD EPI equation. This calculation has not been validated in all clinical situations. eGFR's persistently <60 mL/min signify possible Chronic Kidney Disease.    Anion gap 9 5 - 15    Comment: Performed at [facility] [address] [place], [place] [address]  Magnesium     Status: None   Collection Time: 08/20/17 12:01 PM  Result Value Ref Range   Magnesium 2.3 1.7 - 2.4 mg/dL    Comment:  Performed at [facility] [address], [place],  [address]  CBC WITH DIFFERENTIAL     Status: Abnormal   Collection Time: 08/20/17 12:01 PM  Result Value Ref Range   WBC 9.8 4.0 - 10.5 K/uL   RBC 3.73 (L) 3.87 - 5.11 MIL/uL   Hemoglobin 11.5 (L) 12.0 - 15.0 g/dL   HCT 35.3 (L) 36.0 - 46.0 %   MCV 94.6 78.0 - 100.0 fL  MCH 30.8 26.0 - 34.0 pg   MCHC 32.6 30.0 - 36.0 g/dL   RDW 14.6 11.5 - 15.5 %   Platelets 238 150 - 400 K/uL   Neutrophils Relative % 82 %   Neutro Abs 7.9 (H) 1.7 - 7.7 K/uL   Lymphocytes Relative 11 %   Lymphs Abs 1.1 0.7 - 4.0 K/uL   Monocytes Relative 5 %   Monocytes Absolute 0.5 0.1 - 1.0 K/uL   Eosinophils Relative 1 %   Eosinophils Absolute 0.1 0.0 - 0.7 K/uL   Basophils Relative 1 %   Basophils Absolute 0.1 0.0 - 0.1 K/uL   Immature Granulocytes 0 %   Abs Immature Granulocytes 0.0 0.0 - 0.1 K/uL    Comment: Performed at [facility] [address], [place], [place] [address]  Protime-INR     Status: Abnormal   Collection Time: 08/20/17 12:01 PM  Result Value Ref Range   Prothrombin Time 16.6 (H) 11.4 - 15.2 seconds   INR 1.35     Comment: Performed at [facility] [address], [place], [place] [address]  APTT     Status: Abnormal   Collection Time: 08/20/17 12:01 PM  Result Value Ref Range   aPTT 41 (H) 24 - 36 seconds    Comment:        IF BASELINE aPTT IS ELEVATED, SUGGEST PATIENT RISK ASSESSMENT BE USED TO DETERMINE APPROPRIATE ANTICOAGULANT THERAPY. Performed at [facility], [place] [address] [place], [place] [address]     No results found.  ROSnegative except above Blood pressure 110/72, pulse 63, temperature 97.7 F (36.5 C), resp. rate 14, height 5' 5"  (1.651 m), weight 54.2 kg (119 lb 7.8 oz), SpO2 100 %. Physical Exam Vital signs stable afebrile no acute distress lungs are clear heart regular rate and rhythm abdomen is soft nontender occasional bowel sounds chemistry stable decreased white count  CT report reviewed Assessment/Plan: Questionable CBD stones Plan: We'll proceed with MRCP and if needed ERCP and the risks benefits and methods of that was discussed and if MRCP negative and liver tests decrease maybe she passed a stone and we also discussed possibly an outpatient EUS to be sure and we will await above to decide if ERCP is needed  [facility] 08/20/2017, 2:06 PM

## 2017-08-21 DIAGNOSIS — K5903 Drug induced constipation: Secondary | ICD-10-CM

## 2017-08-21 DIAGNOSIS — E440 Moderate protein-calorie malnutrition: Secondary | ICD-10-CM

## 2017-08-21 DIAGNOSIS — I10 Essential (primary) hypertension: Secondary | ICD-10-CM

## 2017-08-21 DIAGNOSIS — D649 Anemia, unspecified: Secondary | ICD-10-CM

## 2017-08-21 DIAGNOSIS — K8309 Other cholangitis: Secondary | ICD-10-CM

## 2017-08-21 LAB — COMPREHENSIVE METABOLIC PANEL
ALT: 48 U/L — AB (ref 0–44)
AST: 44 U/L — ABNORMAL HIGH (ref 15–41)
Albumin: 2.3 g/dL — ABNORMAL LOW (ref 3.5–5.0)
Alkaline Phosphatase: 299 U/L — ABNORMAL HIGH (ref 38–126)
Anion gap: 6 (ref 5–15)
BUN: 14 mg/dL (ref 8–23)
CHLORIDE: 113 mmol/L — AB (ref 98–111)
CO2: 22 mmol/L (ref 22–32)
CREATININE: 0.76 mg/dL (ref 0.44–1.00)
Calcium: 8.1 mg/dL — ABNORMAL LOW (ref 8.9–10.3)
GFR calc Af Amer: >60 mL/min (ref >60)
GFR calc non Af Amer: >60 mL/min (ref >60)
Glucose, Bld: 80 mg/dL (ref 70–99)
POTASSIUM: 4 mmol/L (ref 3.5–5.1)
SODIUM: 141 mmol/L (ref 135–145)
Total Bilirubin: 1.4 mg/dL — ABNORMAL HIGH (ref 0.3–1.2)
Total Protein: 5.7 g/dL — ABNORMAL LOW (ref 6.5–8.1)

## 2017-08-21 LAB — CBC
HCT: 31.9 % — ABNORMAL LOW (ref 36.0–46.0)
Hemoglobin: 10.7 g/dL — ABNORMAL LOW (ref 12.0–15.0)
MCH: 30.5 pg (ref 26.0–34.0)
MCHC: 33.5 g/dL (ref 30.0–36.0)
MCV: 90.9 fL (ref 78.0–100.0)
PLATELETS: UNDETERMINED 10*3/uL (ref 150–400)
RBC: 3.51 MIL/uL — ABNORMAL LOW (ref 3.87–5.11)
RDW: 14.7 % (ref 11.5–15.5)
WBC: 5 10*3/uL (ref 4.0–10.5)

## 2017-08-21 LAB — GLUCOSE, CAPILLARY: Glucose-Capillary: 76 mg/dL (ref 70–99)

## 2017-08-21 MED ORDER — DOCUSATE SODIUM 100 MG PO CAPS
200.0000 mg | ORAL_CAPSULE | Freq: Two times a day (BID) | ORAL | Status: DC
  Filled 2017-08-21 (×3): qty 2

## 2017-08-21 MED ORDER — ALUM & MAG HYDROXIDE-SIMETH 200-200-20 MG/5ML PO SUSP
30.0000 mL | ORAL | Status: DC | PRN
  Filled 2017-08-21: qty 30

## 2017-08-21 MED ORDER — BISACODYL 10 MG RE SUPP
10.0000 mg | Freq: Once | RECTAL | Status: AC
  Administered 2017-08-21: 10 mg via RECTAL
  Filled 2017-08-21: qty 1

## 2017-08-21 MED ORDER — OXYCODONE HCL 5 MG PO TABS
10.0000 mg | ORAL_TABLET | Freq: Four times a day (QID) | ORAL | Status: DC | PRN
  Administered 2017-08-21 – 2017-08-22 (×4): 10 mg via ORAL
  Filled 2017-08-21 (×5): qty 2

## 2017-08-21 MED ORDER — ENSURE ENLIVE PO LIQD
237.0000 mL | Freq: Two times a day (BID) | ORAL | Status: DC
  Administered 2017-08-22 (×2): 237 mL via ORAL

## 2017-08-21 NOTE — Progress Notes (Signed)
Order received to in and out cath . Will carry out order and continue to monitor patient.

## 2017-08-21 NOTE — Progress Notes (Signed)
Initial Nutrition Assessment  DOCUMENTATION CODES:   Non-severe (moderate) malnutrition in context of chronic illness  INTERVENTION:   - Ensure Enlive po BID, each supplement provides 350 kcal and 20 grams of protein  NUTRITION DIAGNOSIS:   Moderate Malnutrition related to chronic illness(lupus erythematosus with chronic pain, s/p cholecystectomy) as evidenced by mild fat depletion, mild muscle depletion.  GOAL:   Patient will meet greater than or equal to 90% of their needs  MONITOR:   PO intake, Supplement acceptance, Weight trends, I & O's, Labs  REASON FOR ASSESSMENT:   Malnutrition Screening Tool    ASSESSMENT:   72 year old female who transferred to [facility] from [facility] with chief complaint of fever and abdominal pain. PMH significant for lupus erythematosus on chronic pain medication, recent partial SBO due to pain medications, depression, anemia, anxiety, osteoarthritis s/p R total hip replacement, hypertension, and GERD. Pt is s/p cholecystectomy 2 years ago.  Spoke with pt at bedside. Pt's husband and daughter in room at time of visit.  Pt states that her appetite has been "lousy" over the past 2-3 weeks. Pt states that this is because she has not been feeling well. Pt reports doing well on clear liquids and looking forward to having her diet advanced.  Pt states that she normally has a good appetite and eats 2 meals daily. A meal might consist of eggs, toast, tomato, and fruit. Pt states that she has dentures which fit well and work well for her.  Pt endorses weight loss since having her gallbladder removed in 2016. Pt states that prior to this surgery, her UBW was 130 lbs. Pt reports progressively losing weight but that she has been maintaining around 119 lbs recently.  Pt reports that she used to drink Ensure but doesn't anymore because she's "not having issues." Pt agreeable to receiving Ensure Enlive BID during admission  Meal Completion: 100% (clear liquid  diet)  Medications reviewed and include: 15 mg Remeron daily, 40 mg Protonix daily, Senokot daily, IV KCl in NaCl  Labs reviewed: chloride 113 (H), elevated AST and ALT, hemoglobin 10.7 (L), HCT 31.9 (L)  UOP: 950 ml x 24 hours  NUTRITION - FOCUSED PHYSICAL EXAM:    Most Recent Value  Orbital Region  Mild depletion  Upper Arm Region  Mild depletion  Thoracic and Lumbar Region  Mild depletion  Buccal Region  No depletion  Temple Region  Mild depletion  Clavicle Bone Region  Mild depletion  Clavicle and Acromion Bone Region  Mild depletion  Scapular Bone Region  Unable to assess  Dorsal Hand  No depletion  Patellar Region  Mild depletion  Anterior Thigh Region  Mild depletion  Posterior Calf Region  Mild depletion  Edema (RD Assessment)  None  Hair  Reviewed  Eyes  Reviewed  Mouth  Reviewed  Skin  Reviewed  Nails  Reviewed       Diet Order:   Diet Order           Diet regular Room service appropriate? Yes; Fluid consistency: Thin  Diet effective now          EDUCATION NEEDS:   No education needs have been identified at this time  Skin:  Skin Assessment: Reviewed RN Assessment  Last BM:  08/19/17  Height:   Ht Readings from Last 1 Encounters:  08/20/17 5\' 5"  (1.651 m)    Weight:   Wt Readings from Last 1 Encounters:  08/21/17 117 lb 8 oz (53.3 kg)    Ideal  Body Weight:  56.82 kg  BMI:  Body mass index is 19.55 kg/m.  Estimated Nutritional Needs:   Kcal:  1500-1700 kcal/day  Protein:  65-80 grams/day  Fluid:  1.5-1.7 L/day    Gaynell Face, MS, RD, LDN Pager: [phone] Weekend/After Hours: [phone]

## 2017-08-21 NOTE — Progress Notes (Signed)
Patient complaining being unable to empty bladder.Patient bladder scanned for 572.Text paged NP X. Blount awaiting response.

## 2017-08-21 NOTE — Progress Notes (Signed)
Progress Note    Renee Waters  PXT:[id number] DOB: [birth year]  DOA: 08/20/2017 PCP: [name], MD    Brief Narrative:    Medical records reviewed and are as summarized below:  Renee Waters is an 72 y.o. female with medical history significant of lupus erythematosus on chronic pain medication, recent partial small bowel obstruction due to pain medications, major depression, anemia, anxiety, osteoarthritis status post right total hip replacement, hypertension, esophageal reflux disease who is status post cholecystectomy who presented to the [facility] department [facility] complaining of fevers and chills with associated abdominal pain.  Dates that she was well until early July when she developed severe left flank and back pain which she thought was related to her hip.  She saw the rheumatologist who had adjusted medications but that was not helpful.  Last night she started to feel chills and developed a fever initially her temperature was 101 at home.  She took her blood pressure rechecked her temperature an hour later and it was up to 103 at which point her husband encouraged her to go to the emergency department.  Emergency department at [facility] was febrile to 102.9 had tender abdominal examination laboratory data revealed hypokalemia, hyperglycemia, evaded LFTs with a total bili of 2.6, AST is 79, ALT of 68, and alk phos of 556.  Her white blood cell count was slightly elevated at 11.4.  Her hemoglobin was stable at 11.4.  Due to concerns for cholangitis GI was contacted here and she was accepted for transfer.      Assessment/Plan:   Principal Problem:   Acute cholangitis Active Problems:   Western blot positive HSV2   Essential hypertension   GERD (gastroesophageal reflux disease)   Constipation   Systemic lupus (HCC)   Anemia, normocytic normochromic   MDD (major depressive disorder), recurrent episode, moderate (HCC)   Hypokalemia    Hyperglycemia   Acute cholangitis: -presents with elevated bilirubin, elevated alk phos and elevated LFTs in the setting of fever.  -MRCP with movement -no RUQ pain -on rocepin  Hypokalemia:  -replete   Constipation:  - Oxycodone has been resumed however CT scan does reveal moderate stool burden.   -Senokot 2 tablets twice daily as well as miralax.  Systemic lupus: Patient is followed by her rheumatologist.  She takes chronic narcotics for this pain associated with lupus.  Currently renal function appears stable with no evidence of lupus nephritis.  Major depressive disorder recurrent episode moderate: She is being followed very closely by her primary care physician.  She is on numerous medications which we will continue while here in the hospital.  Essential hypertension: Hold HCTZ  Normochromic normocytic anemia: Likely related to chronic inflammation from systemic lupus erythematosus.  Follow-up with primary care physician.     Family Communication/Anticipated D/C date and plan/Code Status   DVT prophylaxis: Lovenox ordered. Code Status: Full Code.  Family Communication: at bedside Disposition Plan: pending   Medical Consultants:    GI     Subjective:   C/o urinary retention and left sacroiliac pain  Objective:    Vitals:   08/20/17 1515 08/20/17 2111 08/21/17 0452 08/21/17 0500  BP: 125/80 137/79 136/76   Pulse: 60 62 62   Resp: _0 Temp: 97.6 F (36.4 C) 98.3 F (36.8 C)    SpO2: 99% 100% 100%   Weight:    53.3 kg (117 lb 8 oz)  Height:  Intake/Output Summary (Last 24 hours) at 08/21/2017 1205 Last data filed at 08/21/2017 0900 Gross per 24 hour  Intake 1231.89 ml  Output 950 ml  Net 281.89 ml   Filed Weights   08/20/17 1010 08/21/17 0500  Weight: 54.2 kg (119 lb 7.8 oz) 53.3 kg (117 lb 8 oz)    Exam: In bed, NAD rrr Clear +BS, soft  Data Reviewed:   I have personally reviewed following labs and imaging  studies:  Labs: Labs show the following:   Basic Metabolic Panel: Recent Labs  Lab 08/20/17 1201 08/21/17 0439  NA 139 141  K 4.4 4.0  CL 109 113*  CO2 21* 22  GLUCOSE 83 80  BUN 14 14  CREATININE 1.08* 0.76  CALCIUM 7.9* 8.1*  MG 2.3  --    GFR Estimated Creatinine Clearance: 54.3 mL/min (by C-G formula based on SCr of 0.76 mg/dL). Liver Function Tests: Recent Labs  Lab 08/20/17 1201 08/21/17 0439  AST 69* 44*  ALT 61* 48*  ALKPHOS 371* 299*  BILITOT 2.6* 1.4*  PROT 6.8 5.7*  ALBUMIN 2.7* 2.3*   No results for input(s): LIPASE, AMYLASE in the last 168 hours. No results for input(s): AMMONIA in the last 168 hours. Coagulation profile Recent Labs  Lab 08/20/17 1201  INR 1.35    CBC: Recent Labs  Lab 08/20/17 1201 08/21/17 0439  WBC 9.8 5.0  NEUTROABS 7.9*  --   HGB 11.5* 10.7*  HCT 35.3* 31.9*  MCV 94.6 90.9  PLT 238 PLATELET CLUMPS NOTED ON SMEAR, UNABLE TO ESTIMATE   Cardiac Enzymes: No results for input(s): CKTOTAL, CKMB, CKMBINDEX, TROPONINI in the last 168 hours. BNP (last 3 results) No results for input(s): PROBNP in the last 8760 hours. CBG: Recent Labs  Lab 08/21/17 0737  GLUCAP 76   D-Dimer: No results for input(s): DDIMER in the last 72 hours. Hgb A1c: No results for input(s): HGBA1C in the last 72 hours. Lipid Profile: No results for input(s): CHOL, HDL, LDLCALC, TRIG, CHOLHDL, LDLDIRECT in the last 72 hours. Thyroid function studies: No results for input(s): TSH, T4TOTAL, T3FREE, THYROIDAB in the last 72 hours.  Invalid input(s): FREET3 Anemia work up: No results for input(s): VITAMINB12, FOLATE, FERRITIN, TIBC, IRON, RETICCTPCT in the last 72 hours. Sepsis Labs: Recent Labs  Lab 08/20/17 1201 08/21/17 0439  WBC 9.8 5.0    Microbiology No results found for this or any previous visit (from the past 240 hour(s)).  Procedures and diagnostic studies:  Mr Abdomen Mrcp W Wo Contast  Result Date: 08/20/2017 CLINICAL DATA:   Abdominal pain since July. Lupus. Cholecystitis. Cholangitis. Follow-up. EXAM: MRI ABDOMEN WITHOUT AND WITH CONTRAST (INCLUDING MRCP) TECHNIQUE: Multiplanar multisequence MR imaging of the abdomen was performed both before and after the administration of intravenous contrast. Heavily T2-weighted images of the biliary and pancreatic ducts were obtained, and three-dimensional MRCP images were rendered by post processing. CONTRAST:  66m MULTIHANCE GADOBENATE DIMEGLUMINE 529 MG/ML IV SOLN COMPARISON:  CT of earlier today and 06/23/2017. FINDINGS: Multifactorial degradation. There is mild motion throughout. There is no significant parenchymal opacification identified on the postcontrast dynamic series. Lower chest: Mild cardiomegaly, without pericardial or pleural effusion. Hepatobiliary: No focal liver lesion. Cholecystectomy. No intra or extrahepatic biliary duct dilatation. No evidence of choledocholithiasis. Pancreas: Mild pancreatic atrophy is felt to be within normal variation for age. No duct dilatation or evidence of pancreatitis. Spleen:  Normal in size, without focal abnormality. Adrenals/Urinary Tract: Normal adrenal glands. Normal appearance of both kidneys. No  hydronephrosis. Stomach/Bowel: Normal stomach and abdominal bowel loops. Vascular/Lymphatic: Normal caliber of the aorta and branch vessels. No retroperitoneal or retrocrural adenopathy. Other:  No ascites. Musculoskeletal: No acute osseous abnormality. IMPRESSION: 1. Multifactorial degradation, including motion and lack of parenchymal opacification on post-contrast images. 2. Given these limitations, no acute process or explanation for abdominal pain/fever. Electronically Signed   By: Abigail Miyamoto M.D.   On: 08/20/2017 23:02    Medications:   . ALPRAZolam  0.5 mg Oral QHS  . docusate sodium  200 mg Oral BID  . enoxaparin (LOVENOX) injection  40 mg Subcutaneous Q24H  . mirtazapine  15 mg Oral QHS  . pantoprazole  40 mg Oral QAC breakfast  .  senna  2 tablet Oral Daily  . sodium chloride flush  3 mL Intravenous Q12H   Continuous Infusions: . 0.9 % NaCl with KCl 20 mEq / L 125 mL/hr at 08/21/17 0620  . cefTRIAXone (ROCEPHIN)  IV 1 g (08/20/17 2341)     LOS: 1 day   Geradine Girt  [facility]   *Please refer to [email or web address], password [email or web address] to get updated schedule on who will round on this patient, as hospitalists switch teams weekly. If 7PM-7AM, please contact night-coverage at [email or web address], password [email or web address] for any overnight needs.  08/21/2017, 12:05 PM

## 2017-08-21 NOTE — Progress Notes (Signed)
Renee Waters 5:04 PM  Subjective: Patient seen and examined with the resident and she's doing better from a GI standpoint and we had a long talk about her diet her back pain and hip pain and her burning urination all of which the hospitalist can take care of and from our standpoint her liver tests are better and no CBD stones were seen and we encouraged her to eat and we discussed her bowels as well and her case discussed with her daughter as well  Objective: Vital signs stable afebrile no acute distress abdomen is soft nontender LFTs decreased CBC okay and we reviewed her urinalysis as well MRCP reviewed  Assessment: Probably passed CBD stone and a patient with some other chronic non-GI complaints  Plan: Repeat liver tests tomorrow just to be sure  And we discussed an outpatient EUS at some point and that procedure was thoroughly discussed with her and her daughter  or wait for another attack to proceed and in the meantime she can workup her other medical complaints with the hospitalist and primary care  [facility] E  Pager [phone] After 5PM or if no answer call [phone]

## 2017-08-21 NOTE — Progress Notes (Signed)
[facility] Progress Note  Renee Waters 72 y.o. Aug 29, 1945  CC:  Fevers and Left hip pain  Subjective: Pain continues to have left hip/flank pain. She is concerned that she has a UTI/kidney infection. States that prior to developing her acute fevers, in the AM she started taking old Amoxicillin for previous UTI. Describes her urine as orange in color without dysuria. She is getting ready to have a BM this AM. Discussed that results of her MRCP. She is comfortable with observation and if pain recurs doing EUS.   ROS: Denies fevers, chills, N/V, abdominal pain, rash, new cuts   Objective: Vital signs in last 24 hours: Vitals:   08/20/17 2111 08/21/17 0452  BP: 137/79 136/76  Pulse: 62 62  Resp: 16 19  Temp: 98.3 F (36.8 C)   SpO2: 100% 100%   General: Well nourished female in no acute distress Pulm: Good air movement with no wheezing or crackles  CV: RRR, no murmurs, no rubs  Abdomen: Active bowel sounds, soft, non-distended, no tenderness to palpation, no flank tenderness  Extremities: No LE edema  Skin: Warm and dry, no rash Neuro: Alert and oriented x 3  Lab Results: Recent Labs    08/20/17 1201 08/21/17 0439  NA 139 141  K 4.4 4.0  CL 109 113*  CO2 21* 22  GLUCOSE 83 80  BUN 14 14  CREATININE 1.08* 0.76  CALCIUM 7.9* 8.1*  MG 2.3  --    Recent Labs    08/20/17 1201 08/21/17 0439  AST 69* 44*  ALT 61* 48*  ALKPHOS 371* 299*  BILITOT 2.6* 1.4*  PROT 6.8 5.7*  ALBUMIN 2.7* 2.3*   Recent Labs    08/20/17 1201 08/21/17 0439  WBC 9.8 5.0  NEUTROABS 7.9*  --   HGB 11.5* 10.7*  HCT 35.3* 31.9*  MCV 94.6 90.9  PLT 238 PLATELET CLUMPS NOTED ON SMEAR, UNABLE TO ESTIMATE   Recent Labs    08/20/17 1201  LABPROT 16.6*  INR 1.35   Assessment/Plan:  Renee Waters is a 72 y.o female with reports SLE and Barrett's esophagus who present from an OSH with fevers and flank pain, initially concerning for acute cholangitis. MRCP preformed without  CBD dilation or obstructing stone.   Transaminitis  - Cholestatic pattern with elevation of her alk phos, AST, ALT, and T bili  - LFTs improving  - MRCP without obstructing stone or CBD dilation but does not completely rule out choledocholithiasis as the stone could have passed.  - Recommend advancing her diet as tolerated. If pain recurs we may need to consider EUS or ERCP at that time.  Will discuss the case further with Dr. Watt Climes.   Ina Homes MD 08/21/2017, 9:32 AM

## 2017-08-22 DIAGNOSIS — M329 Systemic lupus erythematosus, unspecified: Secondary | ICD-10-CM

## 2017-08-22 DIAGNOSIS — E876 Hypokalemia: Secondary | ICD-10-CM

## 2017-08-22 DIAGNOSIS — F331 Major depressive disorder, recurrent, moderate: Secondary | ICD-10-CM

## 2017-08-22 LAB — COMPREHENSIVE METABOLIC PANEL
ALT: 43 U/L (ref 0–44)
AST: 43 U/L — AB (ref 15–41)
Albumin: 2.5 g/dL — ABNORMAL LOW (ref 3.5–5.0)
Alkaline Phosphatase: 306 U/L — ABNORMAL HIGH (ref 38–126)
Anion gap: 7 (ref 5–15)
BUN: 9 mg/dL (ref 8–23)
CHLORIDE: 113 mmol/L — AB (ref 98–111)
CO2: 24 mmol/L (ref 22–32)
CREATININE: 1 mg/dL (ref 0.44–1.00)
Calcium: 8.6 mg/dL — ABNORMAL LOW (ref 8.9–10.3)
GFR calc Af Amer: >60 mL/min (ref >60)
GFR, EST NON AFRICAN AMERICAN: 55 mL/min — AB (ref >60)
GLUCOSE: 88 mg/dL (ref 70–99)
Potassium: 4.5 mmol/L (ref 3.5–5.1)
SODIUM: 144 mmol/L (ref 135–145)
Total Bilirubin: 0.8 mg/dL (ref 0.3–1.2)
Total Protein: 6.2 g/dL — ABNORMAL LOW (ref 6.5–8.1)

## 2017-08-22 LAB — CBC
HCT: 32.5 % — ABNORMAL LOW (ref 36.0–46.0)
Hemoglobin: 10.8 g/dL — ABNORMAL LOW (ref 12.0–15.0)
MCH: 30.5 pg (ref 26.0–34.0)
MCHC: 33.2 g/dL (ref 30.0–36.0)
MCV: 91.8 fL (ref 78.0–100.0)
PLATELETS: 262 10*3/uL (ref 150–400)
RBC: 3.54 MIL/uL — ABNORMAL LOW (ref 3.87–5.11)
RDW: 14.7 % (ref 11.5–15.5)
WBC: 6 10*3/uL (ref 4.0–10.5)

## 2017-08-22 LAB — GLUCOSE, CAPILLARY: Glucose-Capillary: 74 mg/dL (ref 70–99)

## 2017-08-22 MED ORDER — ACYCLOVIR 800 MG PO TABS: 800.0000 mg | ORAL_TABLET | Freq: Four times a day (QID) | ORAL | Status: DC

## 2017-08-22 MED ORDER — DOCUSATE SODIUM 100 MG PO CAPS: 200.0000 mg | ORAL_CAPSULE | Freq: Two times a day (BID) | ORAL | 0 refills | Status: DC | PRN

## 2017-08-22 MED ORDER — HYDROCHLOROTHIAZIDE 12.5 MG PO CAPS
12.5000 mg | ORAL_CAPSULE | Freq: Every day | ORAL | Status: DC
  Administered 2017-08-22: 12.5 mg via ORAL
  Filled 2017-08-22: qty 1

## 2017-08-22 NOTE — Progress Notes (Signed)
Pt given discharge instructions, prescriptions, and care notes. Pt verbalized understanding AEB no further questions or concerns at this time. IV was discontinued, no redness, pain, or swelling noted at this time. Telemetry discontinued and Centralized Telemetry was notified. Pt left the floor via wheelchair with staff in stable condition. 

## 2017-08-22 NOTE — Discharge Summary (Signed)
Physician Discharge Summary  Renee Waters PRF:[id number] DOB: [birth year] DOA: 08/20/2017  PCP: Renee Helper, MD  Admit date: 08/20/2017 Discharge date: 08/22/2017  Admitted From: home  Discharge disposition: home   Recommendations for Outpatient Follow-Up:   1. Outpatient GI follow up- EUs?   Discharge Diagnosis:   Principal Problem:   Acute cholangitis Active Problems:   Western blot positive HSV2   Essential hypertension   GERD (gastroesophageal reflux disease)   Constipation   Systemic lupus (HCC)   Anemia, normocytic normochromic   MDD (major depressive disorder), recurrent episode, moderate (HCC)   Hypokalemia   Hyperglycemia   Malnutrition of moderate degree    Discharge Condition: Improved.  Diet recommendation: Low sodium, heart healthy.  Carbohydrate-modified.  Regular.  Wound care: None.  Code status: Full.   History of Present Illness:   Renee Waters is an 72 y.o. female with medical history significant oflupus erythematosus on chronic pain medication, recent partial small bowel obstruction due to pain medications, major depression, anemia, anxiety, osteoarthritis status post right total hip replacement, hypertension, esophageal reflux disease who is status post cholecystectomy who presented to [facility] complaining of fevers and chills with associated abdominal pain. Dates that she was well until early July when she developed severe left flank and back pain which she thought was related to her hip. She saw the rheumatologist who had adjusted medications but that was not helpful. Last night she started to feel chills and developed a fever initially her temperature was 101 at home. She took her blood pressure rechecked her temperature an hour later and it was up to 103 at which point her husband encouraged her to go to the emergency department. [facility] department at [facility] was febrile to 102.9 had  tender abdominal examination laboratory data revealed hypokalemia, hyperglycemia, evaded LFTs with a total bili of 2.6, AST is 79, ALT of 68, and alk phos of 556. Her white blood cell count was slightly elevated at 11.4. Her hemoglobin was stable at 11.4. Due to concerns for cholangitis GI was contacted here and she was accepted for transfer.     Hospital Course by Problem:   Acute cholangitis: -presents with elevated bilirubin, elevated alk phos and elevated LFTs in the setting of fever.  -MRCP with movement but no blockage seen -no RUQ pain -outpatient GI follow up  Hypokalemia:  -repleted  Constipation:  -Oxycodone has been resumed however CT scan does reveal moderate stool burden.  -needs bowel regimen  Systemic lupus: Patient is followed by her rheumatologist. She takes chronic narcotics for this pain associated with lupus.   Major depressive disorder recurrent episode moderate: She is being followed very closely by her primary care physician. She is on numerous medications which we will continue while here in the hospital.  Essential hypertension: -resume home meds  Normochromic normocytic anemia: Likely related to chronic inflammation from systemic lupus erythematosus. Follow-up with primary care physician.      Medical Consultants:   GI   Discharge Exam:   Vitals:   08/21/17 2100 08/22/17 0500  BP: (!) 151/83 138/76  Pulse: 76 73  Resp: 16 19  Temp: 98.8 F (37.1 C) 98.6 F (37 C)  SpO2: 100% 100%   Vitals:   08/21/17 0500 08/21/17 1507 08/21/17 2100 08/22/17 0500  BP:  140/84 (!) 151/83 138/76  Pulse:  63 76 73  Resp:  18 16 19   Temp:  98.7 F (37.1 C) 98.8 F (  37.1 C) 98.6 F (37 C)  TempSrc:  Oral Oral Oral  SpO2:  100% 100% 100%  Weight: 53.3 kg (117 lb 8 oz)   53.1 kg (117 lb)  Height:        General exam: Appears calm and comfortable. Tearful at times    The results of significant diagnostics from this hospitalization  (including imaging, microbiology, ancillary and laboratory) are listed below for reference.     Procedures and Diagnostic Studies:   Mr Abdomen Mrcp W Wo Contast  Result Date: 08/20/2017 CLINICAL DATA:  Abdominal pain since July. Lupus. Cholecystitis. Cholangitis. Follow-up. EXAM: MRI ABDOMEN WITHOUT AND WITH CONTRAST (INCLUDING MRCP) TECHNIQUE: Multiplanar multisequence MR imaging of the abdomen was performed both before and after the administration of intravenous contrast. Heavily T2-weighted images of the biliary and pancreatic ducts were obtained, and three-dimensional MRCP images were rendered by post processing. CONTRAST:  56m MULTIHANCE GADOBENATE DIMEGLUMINE 529 MG/ML IV SOLN COMPARISON:  CT of earlier today and 06/23/2017. FINDINGS: Multifactorial degradation. There is mild motion throughout. There is no significant parenchymal opacification identified on the postcontrast dynamic series. Lower chest: Mild cardiomegaly, without pericardial or pleural effusion. Hepatobiliary: No focal liver lesion. Cholecystectomy. No intra or extrahepatic biliary duct dilatation. No evidence of choledocholithiasis. Pancreas: Mild pancreatic atrophy is felt to be within normal variation for age. No duct dilatation or evidence of pancreatitis. Spleen:  Normal in size, without focal abnormality. Adrenals/Urinary Tract: Normal adrenal glands. Normal appearance of both kidneys. No hydronephrosis. Stomach/Bowel: Normal stomach and abdominal bowel loops. Vascular/Lymphatic: Normal caliber of the aorta and branch vessels. No retroperitoneal or retrocrural adenopathy. Other:  No ascites. Musculoskeletal: No acute osseous abnormality. IMPRESSION: 1. Multifactorial degradation, including motion and lack of parenchymal opacification on post-contrast images. 2. Given these limitations, no acute process or explanation for abdominal pain/fever. Electronically Signed   By: KAbigail MiyamotoM.D.   On: 08/20/2017 23:02     Labs:    Basic Metabolic Panel: Recent Labs  Lab 08/20/17 1201 08/21/17 0439 08/22/17 0250  NA 139 141 144  K 4.4 4.0 4.5  CL 109 113* 113*  CO2 21* 22 24  GLUCOSE 83 80 88  BUN 14 14 9   CREATININE 1.08* 0.76 1.00  CALCIUM 7.9* 8.1* 8.6*  MG 2.3  --   --    GFR Estimated Creatinine Clearance: 43.3 mL/min (by C-G formula based on SCr of 1 mg/dL). Liver Function Tests: Recent Labs  Lab 08/20/17 1201 08/21/17 0439 08/22/17 0250  AST 69* 44* 43*  ALT 61* 48* 43  ALKPHOS 371* 299* 306*  BILITOT 2.6* 1.4* 0.8  PROT 6.8 5.7* 6.2*  ALBUMIN 2.7* 2.3* 2.5*   No results for input(s): LIPASE, AMYLASE in the last 168 hours. No results for input(s): AMMONIA in the last 168 hours. Coagulation profile Recent Labs  Lab 08/20/17 1201  INR 1.35    CBC: Recent Labs  Lab 08/20/17 1201 08/21/17 0439 08/22/17 0250  WBC 9.8 5.0 6.0  NEUTROABS 7.9*  --   --   HGB 11.5* 10.7* 10.8*  HCT 35.3* 31.9* 32.5*  MCV 94.6 90.9 91.8  PLT 238 PLATELET CLUMPS NOTED ON SMEAR, UNABLE TO ESTIMATE 262   Cardiac Enzymes: No results for input(s): CKTOTAL, CKMB, CKMBINDEX, TROPONINI in the last 168 hours. BNP: Invalid input(s): POCBNP CBG: Recent Labs  Lab 08/21/17 0737 08/22/17 0805  GLUCAP 76 74   D-Dimer No results for input(s): DDIMER in the last 72 hours. Hgb A1c No results for input(s): HGBA1C in  the last 72 hours. Lipid Profile No results for input(s): CHOL, HDL, LDLCALC, TRIG, CHOLHDL, LDLDIRECT in the last 72 hours. Thyroid function studies No results for input(s): TSH, T4TOTAL, T3FREE, THYROIDAB in the last 72 hours.  Invalid input(s): FREET3 Anemia work up No results for input(s): VITAMINB12, FOLATE, FERRITIN, TIBC, IRON, RETICCTPCT in the last 72 hours. Microbiology No results found for this or any previous visit (from the past 240 hour(s)).   Discharge Instructions:   Discharge Instructions    Diet general   Complete by:  As directed    Increase activity slowly    Complete by:  As directed      Allergies as of 08/22/2017      Reactions   Bee Venom Swelling   Oxycodone-acetaminophen Rash   Pt states, "this gives her a rash, but at home she takes oxycodone for pain relief"      Medication List    TAKE these medications   acyclovir 800 MG tablet Commonly known as:  ZOVIRAX Take 1 tablet (800 mg total) by mouth 4 (four) times daily. When needed for outbreak What changed:  additional instructions   ALPRAZolam 0.5 MG tablet Commonly known as:  XANAX Take 1 tablet (0.5 mg total) by mouth at bedtime.   docusate sodium 100 MG capsule Commonly known as:  COLACE Take 2 capsules (200 mg total) by mouth 2 (two) times daily as needed for moderate constipation.   EPINEPHrine 0.3 mg/0.3 mL Soaj injection Commonly known as:  EPI-PEN INJECT 0.3 MLS INTO MUSCLE ONCE AS NEEDED   gabapentin 600 MG tablet Commonly known as:  NEURONTIN Take 300 mg by mouth as needed.   hydrochlorothiazide 12.5 MG capsule Commonly known as:  MICROZIDE TAKE 1 CAPSULE BY MOUTH ONCE DAILY What changed:    how much to take  how to take this  when to take this   meloxicam 7.5 MG tablet Commonly known as:  MOBIC TAKE 1 TABLET BY MOUTH ONCE DAILY What changed:    how much to take  how to take this  when to take this   mirtazapine 15 MG tablet Commonly known as:  REMERON Take 1 tablet (15 mg total) by mouth at bedtime.   multivitamins ther. w/minerals Tabs tablet Take 1 tablet by mouth daily.   omeprazole 40 MG capsule Commonly known as:  PRILOSEC Take 40 mg by mouth daily.   Oxycodone HCl 10 MG Tabs Take 1 tablet (10 mg total) by mouth 4 (four) times daily. What changed:    when to take this  reasons to take this   pantoprazole 40 MG tablet Commonly known as:  PROTONIX Take 1 tablet (40 mg total) by mouth daily before breakfast.   polyethylene glycol packet Commonly known as:  MIRALAX / GLYCOLAX 17 gm in 8 ounces water daily   senna 8.6 MG  Tabs tablet Commonly known as:  SENOKOT Take 2 tablets by mouth daily as needed for mild constipation.   temazepam 30 MG capsule Commonly known as:  RESTORIL Take 1 capsule (30 mg total) by mouth at bedtime as needed for sleep.   tiZANidine 4 MG tablet Commonly known as:  ZANAFLEX One tablet twice daily What changed:    how much to take  how to take this  when to take this  reasons to take this  additional instructions      Follow-up Information    Renee Helper, MD Follow up in 1 week(s).   Specialty:  Family Medicine Contact  information: [address], [place] [address] [phone]            Time coordinating discharge: 35 min  Signed:  Geradine Girt  [facility] 08/22/2017, 1:31 PM

## 2017-08-22 NOTE — Progress Notes (Signed)
[facility] Progress Note  Renee Waters 72 y.o. [birth year]  CC:  Fevers and left hip pain  Subjective: Patient had a rough night. Significant acid reflux after eating last night. Typically takes Omeprazole with complete relief but was unable to get it last night. No abdominal pain with eat. No N/V. No recurrent fevers. She is feeling encouraged that her urine is clearing up. Discussed outpatient follow-up.   ROS : Denies fevers, chills, N/V, abdominal pain   Objective: Vital signs in last 24 hours: Vitals:   08/21/17 2100 08/22/17 0500  BP: (!) 151/83 138/76  Pulse: 76 73  Resp: 16 19  Temp: 98.8 F (37.1 C) 98.6 F (37 C)  SpO2: 100% 100%   General: Well nourished female in no acute distress Pulm: Good air movement with no wheezing or crackles  CV: RRR, no murmurs, no rubs  Abdomen: Active bowel sounds, soft, non-distended, no tenderness to palpation   Lab Results: Recent Labs    08/20/17 1201 08/21/17 0439 08/22/17 0250  NA 139 141 144  K 4.4 4.0 4.5  CL 109 113* 113*  CO2 21* 22 24  GLUCOSE 83 80 88  BUN _0 CREATININE 1.08* 0.76 1.00  CALCIUM 7.9* 8.1* 8.6*  MG 2.3  --   --    Recent Labs    08/21/17 0439 08/22/17 0250  AST 44* 43*  ALT 48* 43  ALKPHOS 299* 306*  BILITOT 1.4* 0.8  PROT 5.7* 6.2*  ALBUMIN 2.3* 2.5*   Recent Labs    08/20/17 1201 08/21/17 0439 08/22/17 0250  WBC 9.8 5.0 6.0  NEUTROABS 7.9*  --   --   HGB 11.5* 10.7* 10.8*  HCT 35.3* 31.9* 32.5*  MCV 94.6 90.9 91.8  PLT 238 PLATELET CLUMPS NOTED ON SMEAR, UNABLE TO ESTIMATE 262   Recent Labs    08/20/17 1201  LABPROT 16.6*  INR 1.35   Assessment/Plan:  Renee Waters is a 72 y.o female with reports SLE and Barrett's esophagus who present from an OSH with fevers and flank pain, initially concerning for acute cholangitis. MRCP preformed without CBD dilation or obstructing stone.   Acute Cholangitis Transaminitis  - Patient doing well this AM without  recurrence of fevers or abdominal pain  - LFTs minimally improved overnight but overall down trending  Cholestatic pattern with elevation of her alk phos, AST, ALT, and T bili  - No further inpatient GI interventions. Needs outpatient EUS.   Will discuss the case further with Dr. Watt Climes.   Ina Homes MD 08/22/2017, 8:02 AM

## 2017-08-23 NOTE — Consult Note (Signed)
            [facility] Primary Care Navigator  08/23/2017  Ayanni Tun 1945/03/24 [id number]   Went tosee patient at the bedside to identify possible discharge needs but she wasalreadydischargedhomeper staff.  Per chart review,patientpresented with fevers/ chills, abdominal and flank pain (acute cholangitis transaminitis)  Primary care provider's office is listed as providing transition of care (TOC) follow-up.  Patient has discharge instruction to follow-up with primary care provider in 1 week.    For additional questions please contact:  Edwena Felty A. Ellinor Test, BSN, RN-BC [facility] PRIMARY CARE Navigator Cell: [phone]

## 2017-08-23 NOTE — Progress Notes (Signed)
The patient was seen by this note Probation officer, last in 03/2017. At that time, the patient was not interested in trying any psychotropics except xanax and temazepam. Would recommend uptitration of mirtazapine in the future to optimize its effect for depression, anxiety. She may also benefit from adding quetiapine for mood dysregulation, which would be also helpful for insomnia and weight loss. She is welcomed to come back to the psychiatry office if she is interested in discussing these psychotropics.  Noted that her ineffective coping skills play significant role in her mood symptoms; would encourage her to continue to see a therapist.   - [place] specialist to continue to follow and inquire if she is interested in visiting psychiatry again.

## 2017-08-24 ENCOUNTER — Telehealth: Payer: Self-pay

## 2017-08-24 NOTE — Telephone Encounter (Signed)
Transition Care Management Follow-up Telephone Call   Date discharged?    08/22/17            How have you been since you were released from the hospital? pretty good.   Do you understand why you were in the hospital? I thought I had a urinary infection. But they said I must have passed a stone  (acute cholangitis)   Do you understand the discharge instructions? yes  Where were you discharged to? home  Items Reviewed:  Medications reviewed: yes  Allergies reviewed: yes  Dietary changes reviewed: yes  Referrals reviewed: no new referrals. Needs to follow up with GI   Functional Questionnaire:   Activities of Daily Living (ADLs):  yes. Able to perform alone    Any transportation issues/concerns?: no   Any patient concerns? no   Confirmed importance and date/time of follow-up visits scheduled yes. Will send message to front staff to schedule TOC  appt with Dr.Simpson     Confirmed with patient if condition begins to worsen call PCP or go to the ER.  Patient was given the office number and encouraged to call back with question or concerns.   yes with verbal understanding

## 2017-08-31 DIAGNOSIS — M25512 Pain in left shoulder: Secondary | ICD-10-CM | POA: Diagnosis not present

## 2017-08-31 DIAGNOSIS — G894 Chronic pain syndrome: Secondary | ICD-10-CM | POA: Diagnosis not present

## 2017-08-31 DIAGNOSIS — Z79891 Long term (current) use of opiate analgesic: Secondary | ICD-10-CM | POA: Diagnosis not present

## 2017-08-31 DIAGNOSIS — M47816 Spondylosis without myelopathy or radiculopathy, lumbar region: Secondary | ICD-10-CM | POA: Diagnosis not present

## 2017-08-31 DIAGNOSIS — M47812 Spondylosis without myelopathy or radiculopathy, cervical region: Secondary | ICD-10-CM | POA: Diagnosis not present

## 2017-09-05 ENCOUNTER — Encounter: Payer: Self-pay | Admitting: Family Medicine

## 2017-09-05 ENCOUNTER — Other Ambulatory Visit: Payer: Self-pay

## 2017-09-05 ENCOUNTER — Ambulatory Visit (INDEPENDENT_AMBULATORY_CARE_PROVIDER_SITE_OTHER): Payer: Medicare Other | Admitting: Family Medicine

## 2017-09-05 VITALS — BP 124/70 | HR 65 | Ht 65.0 in | Wt 117.0 lb

## 2017-09-05 DIAGNOSIS — R1010 Upper abdominal pain, unspecified: Secondary | ICD-10-CM

## 2017-09-05 DIAGNOSIS — S61219A Laceration without foreign body of unspecified finger without damage to nail, initial encounter: Secondary | ICD-10-CM | POA: Insufficient documentation

## 2017-09-05 DIAGNOSIS — K743 Primary biliary cirrhosis: Secondary | ICD-10-CM | POA: Diagnosis not present

## 2017-09-05 DIAGNOSIS — F331 Major depressive disorder, recurrent, moderate: Secondary | ICD-10-CM | POA: Diagnosis not present

## 2017-09-05 DIAGNOSIS — K8309 Other cholangitis: Secondary | ICD-10-CM | POA: Diagnosis not present

## 2017-09-05 DIAGNOSIS — Z23 Encounter for immunization: Secondary | ICD-10-CM | POA: Diagnosis not present

## 2017-09-05 DIAGNOSIS — Z09 Encounter for follow-up examination after completed treatment for conditions other than malignant neoplasm: Secondary | ICD-10-CM

## 2017-09-05 DIAGNOSIS — S61212A Laceration without foreign body of right middle finger without damage to nail, initial encounter: Secondary | ICD-10-CM | POA: Diagnosis not present

## 2017-09-05 NOTE — Assessment & Plan Note (Addendum)
3 days old, still open , needs TdaP same is administered no adverse effects noted

## 2017-09-05 NOTE — Progress Notes (Signed)
Pauline Pegues     MRN: [id number]      DOB: 01/24/1946   HPI Ms. Velador is here for follow up of recent hospitalization from 07/14 to 08/22/2017 for acute cholangitis.She had elevated bilirubin , LFT's and alk phos in the setting of fever , chills and abdominal pain. After 2 days of inpatient antibiotic heap she was discharged home and has gradually improved as far as her symptoms are concerned . Though she denies symptoms of constipation, her abdominal scan showed moderate stool burden She needs outpatient GI follow up and is requesting this be back to her local GI Dr  Fidela Juneau open laceration on right mid finger sustained injury 1 day ago, tetanus outdated needs shot Denies chills , abdominal pain or nausea currently, tolerating a regular diet   States she uses xanax on avg 3 to 4 times per week to help with sleep, and in the day if she getrs anxious , / irritated then takes an additional xanax, states her spouse drives her most of the time. She understands that this is not  a medication which is recommended for her for chronic / long term use. I discusser openly with her her need to have Psych care and to trust the Provider , she has agreed to return to Psychiatry, she leaves for a cruise at the end of this week ROS See HPI Denies recent fever or chills. Denies sinus pressure, nasal congestion, ear pain or sore throat. Denies chest congestion, productive cough or wheezing. Denies chest pains, palpitations and leg swelling Denies abdominal pain, nausea, vomiting,diarrhea or constipation.   Denies dysuria, frequency, hesitancy or incontinence. C/o chronic joint pain and limitaion in mobility. C/o  headaches, seizures, numbness, or tingling.  Denies skin break down or rash.   PE  BP 124/70   Pulse 65   Ht 5' 5"  (1.651 m)   Wt 117 lb 0.6 oz (53.1 kg)   SpO2 97%   BMI 19.48 kg/m   Patient alert and oriented and in no cardiopulmonary distress.  HEENT: No facial asymmetry, EOMI,    oropharynx pink and moist.  Neck supple no JVD, no mass.  Chest: Clear to auscultation bilaterally.  CVS: S1, S2 no murmurs, no S3.Regular rate.  ABD: Soft mild epigastric and RUQ tenderness , no guarding or rebound  Ext: No edema  MS: decreased  ROM spine, shoulders, hips and knees.  Skin: Intact, no ulcerations or rash noted.  Psych: Good eye contact, normal affect. Memory intact mildly  anxious and  depressed appearing.  CNS: CN 2-12 intact, power,  normal throughout.no focal deficits noted.   Assessment & Plan  Laceration of finger, right  36 days old, still open , needs TdaP same is administered no adverse effects noted   Hospital discharge follow-up Hospitalized from 7/14 to 08/22/2017 with acute cholangitis. Reports marked improvement in abdominal pain ,and denies any recent chils or fever. Tolerating small portions of regular diet  . Needs GI follow up to be arranged locally Stable and improved, antibiotic course is completed  MDD (major depressive disorder), recurrent episode, moderate ([place]) Discussed the need for Psychiatric management of her severe depression  Agrees to return to Psychiatry, I advise that  I had been in touch with Dr Modesta Messing her treating Psychiatrist and will refer her back to her care . Has a planned cruise with her son at the end of this week so her mental health currently is improved , but she will need long term Psychiatric care  and I have asked her to trust her Physician  Acute cholangitis Hospitalized from 7/14 to 08/22/2017 with acute cholangitis, requires GI out patient follow up. Alk phos, liver enzymes and bilirubin were all elevated at the time of discharge ,will refer to Dr Gala Romney per pt request

## 2017-09-05 NOTE — Patient Instructions (Signed)
F/u  End September or early October, call if you need me sooner  Please cancel 09/11/appt  You are referred to Dr Modesta Messing, and Dr Tamera Punt today because of open wound on right mid finger  ENJOY but zip line next time!!1

## 2017-09-06 ENCOUNTER — Encounter: Payer: Self-pay | Admitting: Internal Medicine

## 2017-09-10 ENCOUNTER — Encounter: Payer: Self-pay | Admitting: Family Medicine

## 2017-09-10 NOTE — Assessment & Plan Note (Signed)
Hospitalized from 7/14 to 08/22/2017 with acute cholangitis, requires GI out patient follow up. Alk phos, liver enzymes and bilirubin were all elevated at the time of discharge ,will refer to Dr Gala Romney per pt request

## 2017-09-10 NOTE — Assessment & Plan Note (Signed)
Discussed the need for Psychiatric management of her severe depression  Agrees to return to Psychiatry, I advise that  I had been in touch with Dr Modesta Messing her treating Psychiatrist and will refer her back to her care . Has a planned cruise with her son at the end of this week so her mental health currently is improved , but she will need long term Psychiatric care and I have asked her to trust her Physician

## 2017-09-10 NOTE — Assessment & Plan Note (Signed)
Hospitalized from 7/14 to 08/22/2017 with acute cholangitis. Reports marked improvement in abdominal pain ,and denies any recent chils or fever. Tolerating small portions of regular diet  . Needs GI follow up to be arranged locally Stable and improved, antibiotic course is completed

## 2017-09-15 ENCOUNTER — Telehealth: Payer: Self-pay

## 2017-09-15 NOTE — Telephone Encounter (Signed)
[facility] - Left Msg 

## 2017-09-18 ENCOUNTER — Ambulatory Visit (HOSPITAL_COMMUNITY): Payer: Self-pay | Admitting: Licensed Clinical Social Worker

## 2017-09-21 ENCOUNTER — Ambulatory Visit (INDEPENDENT_AMBULATORY_CARE_PROVIDER_SITE_OTHER): Payer: Medicare Other | Admitting: Nurse Practitioner

## 2017-09-21 ENCOUNTER — Encounter: Payer: Self-pay | Admitting: Nurse Practitioner

## 2017-09-21 VITALS — BP 107/67 | HR 81 | Temp 98.4°F | Ht 65.0 in | Wt 116.8 lb

## 2017-09-21 DIAGNOSIS — K8309 Other cholangitis: Secondary | ICD-10-CM | POA: Diagnosis not present

## 2017-09-21 DIAGNOSIS — K5903 Drug induced constipation: Secondary | ICD-10-CM

## 2017-09-21 NOTE — Assessment & Plan Note (Signed)
Recent admission to the hospital for acute cholangitis status post cholecystectomy some years ago.  Likely passed stone given no stone found on MRCP and normalization of LFTs.  She has been doing well since she was out of the hospital.  At this point I will check a 1 month post hospitalization CMP to assure normalization of LFTs.  Her follow-up in 6 months.

## 2017-09-21 NOTE — Assessment & Plan Note (Signed)
Chronic history of constipation due to opioid medications.  Medications we have tried in the past that her prescription strength her insurance would not cover very well and was too expensive for her to afford.  She is currently on Senokot, dietary changes, MiraLAX as needed.  This works pretty well for her.  Although she does have some incomplete emptying.  Overall she is pretty much satisfied with her bowel regimen.  Recommend she continue her current bowel regimen, follow-up in 6 months.

## 2017-09-21 NOTE — Progress Notes (Signed)
cc'ed to pcp °

## 2017-09-21 NOTE — Patient Instructions (Signed)
1. Continue your current medications. 2. Have your lab test drawn when you are able to. 3. Return for follow-up in 6 months. 4. Call us if you have any questions or concerns.  At [facility] we value your feedback. You may receive a survey about your visit today. Please share your experience as we strive to create trusting relationships with our patients to provide genuine, compassionate, quality care.  It was great to see you today!  I'm glad you had a nice trip and I hope you have a great rest of the summer!!

## 2017-09-21 NOTE — Progress Notes (Signed)
Primary Care Physician:  [name], MD Primary Gastroenterologist:  Dr. Gala Romney  Chief Complaint  Patient presents with  . HFU    patient is feeling better    HPI:   Renee Waters is a 72 y.o. female who presents for hospital follow-up.  Patient was admitted from 08/20/2017 through 08/22/2017 for acute cholangitis.  Noted history of lupus on chronic pain medication, recent partial small bowel obstruction due to pain medications, major depression, anemia, anxiety, osteoarthritis status post right total hip replacement, hypertension, esophageal reflux status post cholecystectomy and presented to the ER at [facility] with complaints of fever and chills and associated abdominal pain.  Her pain did not start until early July when she developed severe left flank and back pain.  Rheumatology medication adjustment was not helpful.  Her temperature was 101 at home.  In the emergency department she was febrile at 102.9 with tender abdominal exam, noted hypokalemia, hyperglycemia, and elevated LFTs with a total bilirubin of 2.6, AST/ALT at 79/68, alkaline phosphatase of 556.  Mild leukocytosis at 11.4.  Hemoglobin stable.  MRCP saw movement but no blockage, no right upper quadrant pain.  Recommended outpatient GI follow-up.  Moderate stool burden on CT and needs a bowel regimen on chronic pain medications.  GI consultant opinion related to no obstruction on MRCP is possibly passed CBD stone and some other chronic non-GI complaints.  Repeat LFTs on day of discharge found improvement in AST/ALT to 43/43, alkaline phosphatase declined to 306, bilirubin normal at 0.8.  Today she states she's doing well. Was weak initially after discharge but feeling better now. Went on a trip with her son and his family to [place], [place], [place], Social research officer, government. on a cruise. Denies abdominal pain, N/V, hematochezia, melena, fever, chills, unintentional weight loss. Still with constipation (OIC) on Senakot and "smooth move"  which works mostly well. Movantik previously worked somewhat but was too expensive with her insurance. On her current regimen she has a bowel movement daily, but notes incomplete emptying. Denies chest pain, dyspnea, dizziness, lightheadedness, syncope, near syncope. Denies any other upper or lower GI symptoms.  Past Medical History:  Diagnosis Date  . Acute cholangitis   . Allergy   . Anemia   . Anxiety   . Barrett's esophagus   . Cataract   . Chronic back pain   . Chronic neck pain   . Depression   . Genital herpes   . GERD (gastroesophageal reflux disease)   . History of blood transfusion   . Hypertension   . Insomnia   . Lupus (systemic lupus erythematosus) ([place])   . Neuromuscular disorder ([place])   . Osteoarthritis   . S/P colonoscopy June 2005   normal, no polyps  . S/P endoscopy June 2005, Oct 2009   2005: short-segment Barrett's, 2009: short-segment Barrett's  . UTI (lower urinary tract infection) 10/14   currently on Penicillin-  states is improving    Past Surgical History:  Procedure Laterality Date  . ABDOMINAL HYSTERECTOMY    . BACK SURGERY    . BIOPSY N/A 03/20/2014   Procedure: BIOPSY;  Surgeon: Daneil Dolin, MD;  Location: [facility] ORS;  Service: Endoscopy;  Laterality: N/A;  . BIOPSY  09/14/2015   Procedure: BIOPSY;  Surgeon: Daneil Dolin, MD;  Location: [facility] SU[facility];  Service: Endoscopy;;  esophageal and gastric  . CARPAL TUNNEL RELEASE Left 2013  . cervical disectomy  2002  . CHOLECYSTECTOMY     with lysis of adhesions for  sbo; "ruptured gallbladder".  . COLONOSCOPY  11/09/2011   RMR: Melanosis coli  . COLONOSCOPY WITH PROPOFOL N/A 09/14/2015   Dr. Gala Romney: diverticulosis, 70mm TA removed. next TCS 09/2020.   . DENTAL SURGERY  11/2015   multiple tooth extraction  . ESOPHAGOGASTRODUODENOSCOPY  11/29/2007   salmon-colored  tongue   longest stable at  3 cm, distal esophagus as described previously status post biopsy/ Hiatal hernia, otherwise normal stomach D1 and D2   . ESOPHAGOGASTRODUODENOSCOPY  01/06/11   short segment Barrett's esophagus s/p bx/Hiatal hernia  . ESOPHAGOGASTRODUODENOSCOPY (EGD) WITH PROPOFOL N/A 03/20/2014   OFB:PZWCHENI distal esophagus short segment barrett's, bx with no dysplasia. next egd in 03/2017  . ESOPHAGOGASTRODUODENOSCOPY (EGD) WITH PROPOFOL N/A 09/14/2015   Dr. Gala Romney: Barrett's without dysplasia, gastritis benign bx, hiatal hernia. next EGD 09/2018.  Marland Kitchen HERNIA REPAIR Right 07/2010   Dr. Zada Girt  . JOINT REPLACEMENT    . LAPAROSCOPIC CHOLECYSTECTOMY  2017   at [facility]  . POLYPECTOMY  09/14/2015   Procedure: POLYPECTOMY;  Surgeon: Daneil Dolin, MD;  Location: [facility] SU[facility];  Service: Endoscopy;;  ascending colon  . right hip replacement  07/2010   went back in sept 2012 to fix  . SHOULDER ARTHROSCOPY  2008   left  . TOTAL HIP REVISION Right 12/17/2012   Procedure: RIGHT TOTAL HIP REVISION;  Surgeon: Mauri Pole, MD;  Location: [facility] ORS;  Service: Orthopedics;  Laterality: Right;  . WRIST SURGERY Right 2011   open reduction right wrist.    Current Outpatient Medications  Medication Sig Dispense Refill  . acyclovir (ZOVIRAX) 800 MG tablet Take 1 tablet (800 mg total) by mouth 4 (four) times daily. When needed for outbreak    . ALPRAZolam (XANAX) 0.5 MG tablet Take 1 tablet (0.5 mg total) by mouth at bedtime. 30 tablet 5  . docusate sodium (COLACE) 100 MG capsule Take 2 capsules (200 mg total) by mouth 2 (two) times daily as needed for moderate constipation. 10 capsule 0  . EPINEPHRINE 0.3 mg/0.3 mL IJ SOAJ injection INJECT 0.3 MLS INTO MUSCLE ONCE AS NEEDED 1 Device 2  . gabapentin (NEURONTIN) 600 MG tablet Take 300 mg by mouth as needed.     . hydrochlorothiazide (MICROZIDE) 12.5 MG capsule TAKE 1 CAPSULE BY MOUTH ONCE DAILY (Patient taking differently: TAKE 1 CAPSULE (12.5mg )BY MOUTH ONCE DAILY) 30 capsule 3  . Multiple Vitamins-Minerals (MULTIVITAMINS THER. W/MINERALS) TABS Take 1 tablet by mouth daily.     . Oxycodone  HCl 10 MG TABS Take 1 tablet (10 mg total) by mouth 4 (four) times daily. (Patient taking differently: Take 10 mg by mouth every 6 (six) hours as needed (pain). ) 56 tablet 0  . pantoprazole (PROTONIX) 40 MG tablet Take 1 tablet (40 mg total) by mouth daily before breakfast. 30 tablet 11  . polyethylene glycol (MIRALAX / GLYCOLAX) packet 17 gm in 8 ounces water daily 3350 packet 11  . senna (SENOKOT) 8.6 MG TABS tablet Take 2 tablets by mouth daily as needed for mild constipation.    . temazepam (RESTORIL) 30 MG capsule Take 1 capsule (30 mg total) by mouth at bedtime as needed for sleep. 30 capsule 3  . tiZANidine (ZANAFLEX) 4 MG tablet One tablet twice daily (Patient taking differently: Take 4 mg by mouth 2 (two) times daily as needed for muscle spasms. ) 60 tablet 5   No current facility-administered medications for this visit.     Allergies as of 09/21/2017 - Review Complete 09/21/2017  Allergen Reaction Noted  . Bee venom Swelling 06/12/2012  . Oxycodone-acetaminophen Rash 01/26/2015    Family History  Problem Relation Age of Onset  . Hypertension Mother   . Stroke Mother   . Colon cancer Neg Hx   . Anesthesia problems Neg Hx   . Hypotension Neg Hx   . Malignant hyperthermia Neg Hx   . Pseudochol deficiency Neg Hx   . Gastric cancer Neg Hx   . Esophageal cancer Neg Hx     Social History   Socioeconomic History  . Marital status: Married    Spouse name: louis  . Number of children: 4  . Years of education: Not on file  . Highest education level: Some college, no degree  Occupational History  . Occupation: Press photographer  - retired  . Occupation: non Multimedia programmer  . Financial resource strain: Not hard at all  . Food insecurity:    Worry: Never true    Inability: Never true  . Transportation needs:    Medical: No    Non-medical: No  Tobacco Use  . Smoking status: Former Smoker    Packs/day: 0.25    Years: 25.00    Pack years: 6.25    Types: Cigarettes     Last attempt to quit: 02/07/2003    Years since quitting: 14.6  . Smokeless tobacco: Never Used  . Tobacco comment: quit in 2004  Substance and Sexual Activity  . Alcohol use: No  . Drug use: No  . Sexual activity: Yes    Birth control/protection: Surgical  Lifestyle  . Physical activity:    Days per week: 7 days    Minutes per session: 60 min  . Stress: Not at all  Relationships  . Social connections:    Talks on phone: More than three times a week    Gets together: Twice a week    Attends religious service: Never    Active member of club or organization: No    Attends meetings of clubs or organizations: Never    Relationship status: Married  . Intimate partner violence:    Fear of current or ex partner: No    Emotionally abused: No    Physically abused: No    Forced sexual activity: No  Other Topics Concern  . Not on file  Social History Narrative  . Not on file    Review of Systems: General: Negative for anorexia, weight loss, fever, chills, fatigue, weakness.  ENT: Negative for hoarseness, difficulty swallowing. CV: Negative for chest pain, angina, palpitations, peripheral edema.  Respiratory: Negative for dyspnea at rest, cough, sputum, wheezing.  GI: See history of present illness. MS: Admits chronic pain Endo: Negative for unusual weight change.  Heme: Negative for bruising or bleeding.    Physical Exam: BP 107/67   Pulse 81   Temp 98.4 F (36.9 C) (Oral)   Ht 5\' 5"  (1.651 m)   Wt 116 lb 12.8 oz (53 kg)   BMI 19.44 kg/m  General:   Alert and oriented. Pleasant and cooperative. Well-nourished and well-developed.  Eyes:  Without icterus, sclera clear and conjunctiva pink.  Ears:  Normal auditory acuity. Cardiovascular:  S1, S2 present without murmurs appreciated. Extremities without clubbing or edema. Respiratory:  Clear to auscultation bilaterally. No wheezes, rales, or rhonchi. No distress.  Gastrointestinal:  +BS, soft, non-tender and non-distended.  No HSM noted. No guarding or rebound. No masses appreciated.  Rectal:  Deferred  Musculoskalatal:  Symmetrical without gross deformities. Skin:  Intact without significant lesions or rashes. Neurologic:  Alert and oriented x4;  grossly normal neurologically. Psych:  Alert and cooperative. Normal mood and affect. Heme/Lymph/Immune: No excessive bruising noted.    09/21/2017 9:33 AM   Disclaimer: This note was dictated with voice recognition software. Similar sounding words can inadvertently be transcribed and may not be corrected upon review.

## 2017-09-25 ENCOUNTER — Other Ambulatory Visit: Payer: Self-pay | Admitting: Family Medicine

## 2017-09-26 ENCOUNTER — Other Ambulatory Visit: Payer: Self-pay

## 2017-09-26 ENCOUNTER — Telehealth: Payer: Self-pay | Admitting: Family Medicine

## 2017-09-26 MED ORDER — HYDROCHLOROTHIAZIDE 12.5 MG PO CAPS: 12.5000 mg | ORAL_CAPSULE | Freq: Every day | ORAL | 5 refills | Status: DC

## 2017-09-26 NOTE — Telephone Encounter (Signed)
New Message   *STAT* If patient is at the pharmacy, call can be transferred to refill team.   1. Which medications need to be refilled? (please list name of each medication and dose if known)  Hydrochlorothiazide 12.5 mg capsule once daily  2. Which pharmacy/location (including street and city if local pharmacy) is medication to be sent to? [facility] [phone], [address], [place], [place] [address]  3. Do they need a 30 day or 90 day supply?  30 days

## 2017-09-26 NOTE — Telephone Encounter (Signed)
Done

## 2017-09-27 ENCOUNTER — Telehealth: Payer: Self-pay

## 2017-09-27 ENCOUNTER — Other Ambulatory Visit: Payer: Self-pay | Admitting: Nurse Practitioner

## 2017-09-27 DIAGNOSIS — F418 Other specified anxiety disorders: Secondary | ICD-10-CM

## 2017-09-27 DIAGNOSIS — K5903 Drug induced constipation: Secondary | ICD-10-CM | POA: Diagnosis not present

## 2017-09-27 DIAGNOSIS — K8309 Other cholangitis: Secondary | ICD-10-CM | POA: Diagnosis not present

## 2017-09-27 NOTE — BH Specialist Note (Signed)
[facility] Telephone Follow-up  MRN: [id number] NAME: Renee Waters Date: 09/27/17   Total time: 30 minutes Call number: 3/6  Reason for call today: Reason for Contact: PHQ9-4 weeks  PHQ-9 Scores:  Depression screen [facility] 2/9 09/27/2017 09/05/2017 08/15/2017 08/15/2017 08/15/2017  Decreased Interest 0 0 3 3 2   Down, Depressed, Hopeless 0 0 3 3 2   PHQ - 2 Score 0 0 6 6 4   Altered sleeping 0 2 3 3 2   Tired, decreased energy 0 2 3 3 3   Change in appetite 0 0 2 3 3   Feeling bad or failure about yourself  0 0 3 3 0  Trouble concentrating 0 0 1 3 1   Moving slowly or fidgety/restless 0 0 2 3 0  Suicidal thoughts 0 0 0 1 1  PHQ-9 Score 0 4 20 25 14   Difficult doing work/chores Not difficult at all Somewhat difficult Extremely dIfficult - Very difficult  Some recent data might be hidden    GAD-7 Scores:  GAD 7 : Generalized Anxiety Score 09/27/2017 08/15/2017  Nervous, Anxious, on Edge 1 3  Control/stop worrying 0 3  Worry too much - different things 1 3  Trouble relaxing 1 2  Restless 1 1  Easily annoyed or irritable 1 1  Afraid - awful might happen 1 3  Total GAD 7 Score 6 16  Anxiety Difficulty Somewhat difficult Very difficult    Stress Current stressors: Current Stressors: (None Reported) Sleep: Sleep: No problems Appetite: Appetite: No problems Coping ability: Coping ability: Normal  Patient taking medications as prescribed: Patient taking medications as prescribed: Yes  Current medications:  Outpatient Encounter Medications as of 09/27/2017  Medication Sig  . acyclovir (ZOVIRAX) 800 MG tablet Take 1 tablet (800 mg total) by mouth 4 (four) times daily. When needed for outbreak  . ALPRAZolam (XANAX) 0.5 MG tablet Take 1 tablet (0.5 mg total) by mouth at bedtime.  . docusate sodium (COLACE) 100 MG capsule Take 2 capsules (200 mg total) by mouth 2 (two) times daily as needed for moderate constipation.  Marland Kitchen EPINEPHRINE 0.3 mg/0.3 mL IJ SOAJ injection INJECT 0.3 MLS INTO  MUSCLE ONCE AS NEEDED  . gabapentin (NEURONTIN) 600 MG tablet Take 300 mg by mouth as needed.   . hydrochlorothiazide (MICROZIDE) 12.5 MG capsule Take 1 capsule (12.5 mg total) by mouth daily.  . Multiple Vitamins-Minerals (MULTIVITAMINS THER. W/MINERALS) TABS Take 1 tablet by mouth daily.   . Oxycodone HCl 10 MG TABS Take 1 tablet (10 mg total) by mouth 4 (four) times daily. (Patient taking differently: Take 10 mg by mouth every 6 (six) hours as needed (pain). )  . pantoprazole (PROTONIX) 40 MG tablet Take 1 tablet (40 mg total) by mouth daily before breakfast.  . pantoprazole (PROTONIX) 40 MG tablet TAKE 1 TABLET BY MOUTH ONCE DAILY BEFORE BREAKFAST  . polyethylene glycol (MIRALAX / GLYCOLAX) packet 17 gm in 8 ounces water daily  . senna (SENOKOT) 8.6 MG TABS tablet Take 2 tablets by mouth daily as needed for mild constipation.  . temazepam (RESTORIL) 30 MG capsule TAKE 1 CAPSULE BY MOUTH AT BEDTIME AS NEEDED FOR SLEEP  . tiZANidine (ZANAFLEX) 4 MG tablet One tablet twice daily (Patient taking differently: Take 4 mg by mouth 2 (two) times daily as needed for muscle spasms. )   No facility-administered encounter medications on file as of 09/27/2017.      Self-harm Behaviors Risk Assessment Self-harm risk factors: Self-harm risk factors: (None Reported) Patient endorses recent thoughts  of harming self: Have you recently had any thoughts about harming yourself?: No  [place] Suicide Severity Rating Scale: No flowsheet data found. C-SRSS 08/15/2017 October 24, 2017 2017/10/24 2017-10-24  1. Wish to be Dead No No No No  2. Suicidal Thoughts No No - No  6. Suicide Behavior Question No No No No     Danger to Others Risk Assessment Danger to others risk factors: Danger to Others Risk Factors: No risk factors noted Patient endorses recent thoughts of harming others: Notification required: No need or identified person    Substance Use Assessment Patient recently consumed alcohol:  No  Alcohol Use  Disorder Identification Test (AUDIT):  Alcohol Use Disorder Test (AUDIT) 08/15/2017 Oct 24, 2017  1. How often do you have a drink containing alcohol? 0 0  2. How many drinks containing alcohol do you have on a typical day when you are drinking? 0 0  3. How often do you have six or more drinks on one occasion? 0 0  AUDIT-C Score 0 0  Intervention/Follow-up AUDIT Score <7 follow-up not indicated AUDIT Score <7 follow-up not indicated   Patient recently used drugs:  No  Opioid Risk Assessment: No   Goals, Interventions and Follow-up Plan  Goals: Increase healthy adjustment to current life circumstances Interventions: Behavioral Activation and Supportive Counseling Follow-up Plan: [facility] Phone Follow Up       Summary:   Follow Up - Patient is a 72 year old female.  Patient reports improved mood as evidenced in a decreased in her PHQ and GAD scores.   Patient reports that she has returned from a cruise with her family.  Patient reports that she is only taking Xanax at night.  Patient reports that the medication is working well for her.  Patient denies any issues with sleep or appetite.   Patient denies SI/HI/Psychosis/Substance Abuse. If your symptoms worsen or you have thoughts of suicide/homicide, PLEASE SEEK IMMEDIATE MEDICAL ATTENTION.  You may always call:  National Suicide Hotline: 920-295-3210;  [place] Crisis Line: 3511601102;  Crisis Recovery in [place]: 715-329-2160.  These are available 24 hours a day, 7 days a week.  During the next session the patient will discuss how she has continued to incorporate her family into her  life. Patient will discuss other community related activities that she plans on attending.  Patient has an upcoming appt with Dr. Modesta Messing and a therapist Glori Bickers on 10-11-2017 and 10-24-2017    [place], [place], [place]

## 2017-09-27 NOTE — Progress Notes (Signed)
Patient has follow up with psychiatry and therapist; [facility] will sign off.

## 2017-09-28 LAB — COMPREHENSIVE METABOLIC PANEL
A/G RATIO: 1.1 — AB (ref 1.2–2.2)
ALBUMIN: 3.7 g/dL (ref 3.5–4.8)
ALK PHOS: 118 IU/L — AB (ref 39–117)
ALT: 10 IU/L (ref 0–32)
AST: 20 IU/L (ref 0–40)
BILIRUBIN TOTAL: 0.6 mg/dL (ref 0.0–1.2)
BUN / CREAT RATIO: 17 (ref 12–28)
BUN: 14 mg/dL (ref 8–27)
CHLORIDE: 104 mmol/L (ref 96–106)
CO2: 25 mmol/L (ref 20–29)
Calcium: 9.1 mg/dL (ref 8.7–10.3)
Creatinine, Ser: 0.82 mg/dL (ref 0.57–1.00)
GFR calc Af Amer: 83 mL/min/{1.73_m2} (ref >59)
GFR calc non Af Amer: 72 mL/min/{1.73_m2} (ref >59)
Globulin, Total: 3.5 g/dL (ref 1.5–4.5)
Glucose: 118 mg/dL — ABNORMAL HIGH (ref 65–99)
POTASSIUM: 4 mmol/L (ref 3.5–5.2)
SODIUM: 141 mmol/L (ref 134–144)
TOTAL PROTEIN: 7.2 g/dL (ref 6.0–8.5)

## 2017-09-28 LAB — SPECIMEN STATUS REPORT

## 2017-09-29 ENCOUNTER — Telehealth: Payer: Self-pay

## 2017-09-29 DIAGNOSIS — M47812 Spondylosis without myelopathy or radiculopathy, cervical region: Secondary | ICD-10-CM | POA: Diagnosis not present

## 2017-09-29 DIAGNOSIS — M25512 Pain in left shoulder: Secondary | ICD-10-CM | POA: Diagnosis not present

## 2017-09-29 DIAGNOSIS — M47816 Spondylosis without myelopathy or radiculopathy, lumbar region: Secondary | ICD-10-CM | POA: Diagnosis not present

## 2017-09-29 DIAGNOSIS — G894 Chronic pain syndrome: Secondary | ICD-10-CM | POA: Diagnosis not present

## 2017-09-29 NOTE — Telephone Encounter (Signed)
Per Dr. Modesta Messing the patient will be placed on the inactive list due to an upcoming appt with Dr. Modesta Messing and Arrie Eastern on 10-12-2015 and 10-24-2017

## 2017-10-11 ENCOUNTER — Encounter (HOSPITAL_COMMUNITY): Payer: Self-pay | Admitting: Licensed Clinical Social Worker

## 2017-10-11 ENCOUNTER — Ambulatory Visit (INDEPENDENT_AMBULATORY_CARE_PROVIDER_SITE_OTHER): Payer: Medicare Other | Admitting: Licensed Clinical Social Worker

## 2017-10-11 DIAGNOSIS — F331 Major depressive disorder, recurrent, moderate: Secondary | ICD-10-CM

## 2017-10-11 NOTE — Progress Notes (Signed)
   THERAPIST PROGRESS NOTE  Session Time: 10:00 am -10:45 am  Participation Level: Active  Behavioral Response: CasualAlertDepressed  Type of Therapy: Individual Therapy  Treatment Goals addressed: Coping  Interventions: CBT and Solution Focused  Summary: Renee Waters is a 72 y.o. female who presents oriented x5 (person, place, situation, time and object), alert, depressed, tearful, average height, thin, and cooperative to address mood. Patient has a history of medical treatment including hypertension and chronic pain. Patient has minimal history of mental health including outpatient therapy several years ago and medication management. Patient denies symptoms of mania. Patient denies suicidal and homicidal ideations. Patient denies psychosis including auditory and visual hallucinations. Patient denies substance use. Patient is at low risk for lethality at this time.   Physically: Patient is tired but doing well.  Spiritually/values: No issues identified.  Relationships: Patient is getting along with her family. Her daughter is in a difficult spot financially and may have to move. Patient is unable to help her but is praying for her. Patient was frustrated with her husband. She feels like he is selfish and only things about himself.  Emotional/Mental/Behavior: Patient travelled and went on a cruise. This is something she has been wanting to do. This helped with her mood. Patient said that her daughter in law showed out on the cruise and she is never going on a cruise with her again.   Patient engaged in session. She responded well to interventions. Patient continues to meet criteria for Major depressive disorder, recurrent episode, moderate with anxious distress. Patient will continue in outpatient therapy due to being the least restrictive service to meet her needs. Patient made moderate progress on her goals.   Suicidal/Homicidal: Negativewithout intent/plan  Therapist Response:  Therapist reviewed patient's recent thoughts and behaviors. Therapist utilized CBT to address mood. Therapist reviewed patient's goals. Therapist processed patient's feelings to identify triggers for mood. Therapist explored patient's relationship with her family and how traveling improved her mood.   Plan: Return again in 4 weeks.  Diagnosis: Axis I: Major depressive disorder, recurrent episode, moderate with anxious distress    Axis II: No diagnosis    Glori Bickers, LCSW 10/11/2017
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
## 2017-10-18 ENCOUNTER — Ambulatory Visit: Payer: Self-pay | Admitting: Family Medicine

## 2017-10-24 ENCOUNTER — Encounter (HOSPITAL_COMMUNITY): Payer: Self-pay | Admitting: Psychiatry

## 2017-10-24 ENCOUNTER — Ambulatory Visit (INDEPENDENT_AMBULATORY_CARE_PROVIDER_SITE_OTHER): Payer: Medicare Other | Admitting: Psychiatry

## 2017-10-24 ENCOUNTER — Other Ambulatory Visit: Payer: Self-pay | Admitting: Family Medicine

## 2017-10-24 VITALS — BP 184/96 | HR 76 | Ht 65.0 in | Wt 114.0 lb

## 2017-10-24 DIAGNOSIS — Z560 Unemployment, unspecified: Secondary | ICD-10-CM

## 2017-10-24 DIAGNOSIS — Z62810 Personal history of physical and sexual abuse in childhood: Secondary | ICD-10-CM

## 2017-10-24 DIAGNOSIS — F330 Major depressive disorder, recurrent, mild: Secondary | ICD-10-CM

## 2017-10-24 DIAGNOSIS — Z630 Problems in relationship with spouse or partner: Secondary | ICD-10-CM | POA: Diagnosis not present

## 2017-10-24 DIAGNOSIS — Z818 Family history of other mental and behavioral disorders: Secondary | ICD-10-CM | POA: Diagnosis not present

## 2017-10-24 NOTE — Patient Instructions (Signed)
1. Consider starting fluoxetine or quetiapine if interested.  2. Return to clinic as needed

## 2017-10-26 DIAGNOSIS — G894 Chronic pain syndrome: Secondary | ICD-10-CM | POA: Diagnosis not present

## 2017-10-26 DIAGNOSIS — Z79891 Long term (current) use of opiate analgesic: Secondary | ICD-10-CM | POA: Diagnosis not present

## 2017-10-26 DIAGNOSIS — M25511 Pain in right shoulder: Secondary | ICD-10-CM | POA: Diagnosis not present

## 2017-10-26 DIAGNOSIS — M47812 Spondylosis without myelopathy or radiculopathy, cervical region: Secondary | ICD-10-CM | POA: Diagnosis not present

## 2017-10-26 DIAGNOSIS — M47816 Spondylosis without myelopathy or radiculopathy, lumbar region: Secondary | ICD-10-CM | POA: Diagnosis not present

## 2017-10-30 ENCOUNTER — Other Ambulatory Visit: Payer: Self-pay | Admitting: Family Medicine

## 2017-10-31 DIAGNOSIS — M791 Myalgia, unspecified site: Secondary | ICD-10-CM | POA: Diagnosis not present

## 2017-10-31 DIAGNOSIS — M545 Low back pain: Secondary | ICD-10-CM | POA: Diagnosis not present

## 2017-10-31 DIAGNOSIS — Z681 Body mass index (BMI) 19 or less, adult: Secondary | ICD-10-CM | POA: Diagnosis not present

## 2017-10-31 DIAGNOSIS — R768 Other specified abnormal immunological findings in serum: Secondary | ICD-10-CM | POA: Diagnosis not present

## 2017-10-31 DIAGNOSIS — M2550 Pain in unspecified joint: Secondary | ICD-10-CM | POA: Diagnosis not present

## 2017-10-31 DIAGNOSIS — M150 Primary generalized (osteo)arthritis: Secondary | ICD-10-CM | POA: Diagnosis not present

## 2017-11-09 ENCOUNTER — Encounter: Payer: Self-pay | Admitting: Family Medicine

## 2017-11-09 ENCOUNTER — Ambulatory Visit (INDEPENDENT_AMBULATORY_CARE_PROVIDER_SITE_OTHER): Payer: Medicare Other | Admitting: Family Medicine

## 2017-11-09 VITALS — BP 148/80 | HR 62 | Resp 15 | Ht 65.0 in | Wt 114.0 lb

## 2017-11-09 DIAGNOSIS — R2681 Unsteadiness on feet: Secondary | ICD-10-CM

## 2017-11-09 DIAGNOSIS — I10 Essential (primary) hypertension: Secondary | ICD-10-CM

## 2017-11-09 DIAGNOSIS — Z23 Encounter for immunization: Secondary | ICD-10-CM | POA: Diagnosis not present

## 2017-11-09 DIAGNOSIS — R52 Pain, unspecified: Secondary | ICD-10-CM

## 2017-11-09 DIAGNOSIS — F418 Other specified anxiety disorders: Secondary | ICD-10-CM | POA: Diagnosis not present

## 2017-11-09 MED ORDER — POTASSIUM CHLORIDE ER 10 MEQ PO TBCR: 10.0000 meq | EXTENDED_RELEASE_TABLET | Freq: Two times a day (BID) | ORAL | 3 refills | Status: DC

## 2017-11-09 MED ORDER — HYDROCHLOROTHIAZIDE 25 MG PO TABS: 25.0000 mg | ORAL_TABLET | Freq: Every day | ORAL | 3 refills | Status: DC

## 2017-11-09 NOTE — Patient Instructions (Addendum)
Physical exam in 6 to 8 weeks, call if  you need me sooner  Flu vaccine today  Thankful you recovered well and had a  great trip  I will link you with, Senior [facility] group   Blood pressure is high INCREASE hCTZ to 25 mg daily, therefore take TWO 12.5 mg tablets together till you get the new dose New is potassium 10 meq one twice daily  Thank you  for choosing [facility]. We consider it a privelige to serve you.  Delivering excellent health care in a caring and  compassionate way is our goal.  Partnering with you,  so that together we can achieve this goal is our strategy.

## 2017-11-11 ENCOUNTER — Encounter: Payer: Self-pay | Admitting: Family Medicine

## 2017-11-11 NOTE — Assessment & Plan Note (Signed)
Uncontrolled, increase HCTZ to 25 mg daily and add potassium, re eval in 6 to 8 weeks DASH diet and commitment to daily physical activity for a minimum of 30 minutes discussed and encouraged, as a part of hypertension management. The importance of attaining a healthy weight is also discussed.  BP/Weight 11/09/2017 09/21/2017 09/05/2017 08/22/2017 08/20/2017 1/61/0960 05/12/4096  Systolic BP 119 147 829 562 - - 130  Diastolic BP 80 67 70 76 - - 74  Wt. (Lbs) 114 116.8 117.04 117 114 - 115  BMI 18.97 19.44 19.48 - 18.97 19.47 19.14  Some encounter information is confidential and restricted. Go to Review Flowsheets activity to see all data.

## 2017-11-11 NOTE — Progress Notes (Signed)
   Renee Waters     MRN: [id number]      DOB: 1945-10-28   HPI Renee Waters is here for follow up and re-evaluation of chronic medical conditions, medication management and review of any available recent lab and radiology data.  Preventive health is updated, specifically  Cancer screening and Immunization.   Questions or concerns regarding consultations or procedures which the PT has had in the interim are  Addressed.Has seen psychiatry but will not continue at this time, however will see her therapist The PT denies any adverse reactions to current medications since the last visit.  Chronic persistent pain, however dealing with it.    ROS Denies recent fever or chills. Denies sinus pressure, nasal congestion, ear pain or sore throat. Denies chest congestion, productive cough or wheezing. Denies chest pains, palpitations and leg swelling Denies abdominal pain, nausea, vomiting,diarrhea or constipation.   Denies dysuria, frequency, hesitancy or incontinence. C/o chronic  joint pain, and limitation in mobility. Denies headaches, seizures, numbness, or tingling. Denies uncontrolled  depression, anxiety or insomnia. Denies skin break down or rash.   PE  BP (!) 148/80   Pulse 62   Resp 15   Ht 5\' 5"  (1.651 m)   Wt 114 lb (51.7 kg)   SpO2 98%   BMI 18.97 kg/m   Patient alert and oriented and in no cardiopulmonary distress.  HEENT: No facial asymmetry, EOMI,   oropharynx pink and moist.  Neck supple no JVD, no mass.  Chest: Clear to auscultation bilaterally.  CVS: S1, S2 no murmurs, no S3.Regular rate.  ABD: Soft non tender.   Ext: No edema  MS: decreased  ROM spine, adequate in shoulders, hips and knees.  Skin: Intact, no ulcerations or rash noted.  Psych: Good eye contact, normal affect. Memory intact not anxious or depressed appearing.  CNS: CN 2-12 intact, power,  normal throughout.no focal deficits noted.   Assessment & Plan  Essential hypertension Uncontrolled,  increase HCTZ to 25 mg daily and add potassium, re eval in 6 to 8 weeks DASH diet and commitment to daily physical activity for a minimum of 30 minutes discussed and encouraged, as a part of hypertension management. The importance of attaining a healthy weight is also discussed.  BP/Weight 11/09/2017 09/21/2017 09/05/2017 08/22/2017 08/20/2017 2/91/9166 0/07/43  Systolic BP 997 741 423 953 - - 202  Diastolic BP 80 67 70 76 - - 74  Wt. (Lbs) 114 116.8 117.04 117 114 - 115  BMI 18.97 19.44 19.48 - 18.97 19.47 19.14  Some encounter information is confidential and restricted. Go to Review Flowsheets activity to see all data.       Unsteady gait Home safety and use of cane recommended, which she does  Generalized pain Managed through pain clinic, has arthritis involving spine and several major joints  Depression with anxiety Improved and benefits most from therapy , which she will continue

## 2017-11-11 NOTE — Assessment & Plan Note (Signed)
Home safety and use of cane recommended, which she does

## 2017-11-11 NOTE — Assessment & Plan Note (Addendum)
Managed through pain clinic, has arthritis involving spine and several major joints

## 2017-11-11 NOTE — Assessment & Plan Note (Signed)
Improved and benefits most from therapy , which she will continue

## 2017-11-23 DIAGNOSIS — M47812 Spondylosis without myelopathy or radiculopathy, cervical region: Secondary | ICD-10-CM | POA: Diagnosis not present

## 2017-11-23 DIAGNOSIS — Z79891 Long term (current) use of opiate analgesic: Secondary | ICD-10-CM | POA: Diagnosis not present

## 2017-11-23 DIAGNOSIS — G894 Chronic pain syndrome: Secondary | ICD-10-CM | POA: Diagnosis not present

## 2017-11-23 DIAGNOSIS — M47816 Spondylosis without myelopathy or radiculopathy, lumbar region: Secondary | ICD-10-CM | POA: Diagnosis not present

## 2017-11-29 ENCOUNTER — Ambulatory Visit (INDEPENDENT_AMBULATORY_CARE_PROVIDER_SITE_OTHER): Payer: Medicare Other | Admitting: Licensed Clinical Social Worker

## 2017-11-29 ENCOUNTER — Encounter (HOSPITAL_COMMUNITY): Payer: Self-pay | Admitting: Licensed Clinical Social Worker

## 2017-11-29 ENCOUNTER — Ambulatory Visit (INDEPENDENT_AMBULATORY_CARE_PROVIDER_SITE_OTHER): Payer: Medicare Other

## 2017-11-29 VITALS — BP 134/81 | HR 61 | Resp 12 | Ht 65.0 in | Wt 119.0 lb

## 2017-11-29 DIAGNOSIS — F330 Major depressive disorder, recurrent, mild: Secondary | ICD-10-CM

## 2017-11-29 DIAGNOSIS — Z0000 Encounter for general adult medical examination without abnormal findings: Secondary | ICD-10-CM | POA: Diagnosis not present

## 2017-11-29 NOTE — Progress Notes (Signed)
   THERAPIST PROGRESS NOTE  Session Time: 10:00 am -10:45 am  Participation Level: Active  Behavioral Response: CasualAlertDepressed  Type of Therapy: Individual Therapy  Treatment Goals addressed: Coping  Interventions: CBT and Solution Focused  Summary: Renee Waters is a 72 y.o. female who presents oriented x5 (person, place, situation, time and object), alert, depressed, tearful, average height, thin, and cooperative to address mood. Patient has a history of medical treatment including hypertension and chronic pain. Patient has minimal history of mental health including outpatient therapy several years ago and medication management. Patient denies symptoms of mania. Patient denies suicidal and homicidal ideations. Patient denies psychosis including auditory and visual hallucinations. Patient denies substance use. Patient is at low risk for lethality at this time.   Physically: No issues identified.  Spiritually/values: No issues identified.  Relationships: Patient feels like her family doesn't want to get together anymore due to being busy with their own lives. Patient has a good relationship with her children but doesn't agree with what her daughter is doing (dating a man the family doesn't like). Patient's relationship with her husband is great. They recently traveled and it was a good experience for them both.  Emotional/Mental/Behavior: Patient's mood was ok. There were some family issues that were causing her stress but she understands that she can't change them and has to accept how things are. She also recognizes that she has a lot of things going well for her.   Patient engaged in session. She responded well to interventions. Patient continues to meet criteria for Major depressive disorder, recurrent episode, moderate with anxious distress. Patient will continue in outpatient therapy due to being the least restrictive service to meet her needs. Patient made moderate progress on her  goals.   Suicidal/Homicidal: Negativewithout intent/plan  Therapist Response: Therapist reviewed patient's recent thoughts and behaviors. Therapist utilized CBT to address mood. Therapist reviewed patient's goals. Therapist processed patient's feelings to identify triggers for mood. Therapist explored patient's relationship with her family.  Plan: Return again in 4 weeks.  Diagnosis: Axis I: Major depressive disorder, recurrent episode, moderate with anxious distress    Axis II: No diagnosis    Glori Bickers, LCSW 11/29/2017

## 2017-11-29 NOTE — Patient Instructions (Signed)
Renee Waters , Thank you for taking time to come for your Medicare Wellness Visit. I appreciate your ongoing commitment to your health goals. Please review the following plan we discussed and let me know if I can assist you in the future.   Screening recommendations/referrals: Colonoscopy: up to date  Mammogram: up to date  Bone Density: up to date  Recommended yearly ophthalmology/optometry visit for glaucoma screening and checkup Recommended yearly dental visit for hygiene and checkup  Vaccinations: Influenza vaccine: up to date  Pneumococcal vaccine: up to date  Tdap vaccine: up to date  Shingles vaccine: up to date    Advanced directives: Information given   Conditions/risks identified: chronic pain, impaired mobility   Next appointment: wellness in one year    Preventive Care 72 Years and Older, Female Preventive care refers to lifestyle choices and visits with your health care provider that can promote health and wellness. What does preventive care include?  A yearly physical exam. This is also called an annual well check.  Dental exams once or twice a year.  Routine eye exams. Ask your health care provider how often you should have your eyes checked.  Personal lifestyle choices, including:  Daily care of your teeth and gums.  Regular physical activity.  Eating a healthy diet.  Avoiding tobacco and drug use.  Limiting alcohol use.  Practicing safe sex.  Taking low-dose aspirin every day.  Taking vitamin and mineral supplements as recommended by your health care provider. What happens during an annual well check? The services and screenings done by your health care provider during your annual well check will depend on your age, overall health, lifestyle risk factors, and family history of disease. Counseling  Your health care provider may ask you questions about your:  Alcohol use.  Tobacco use.  Drug use.  Emotional well-being.  Home and relationship  well-being.  Sexual activity.  Eating habits.  History of falls.  Memory and ability to understand (cognition).  Work and work Statistician.  Reproductive health. Screening  You may have the following tests or measurements:  Height, weight, and BMI.  Blood pressure.  Lipid and cholesterol levels. These may be checked every 5 years, or more frequently if you are over 72 years old.  Skin check.  Lung cancer screening. You may have this screening every year starting at age 72 if you have a 30-pack-year history of smoking and currently smoke or have quit within the past 15 years.  Fecal occult blood test (FOBT) of the stool. You may have this test every year starting at age 72.  Flexible sigmoidoscopy or colonoscopy. You may have a sigmoidoscopy every 5 years or a colonoscopy every 10 years starting at age 72.  Hepatitis C blood test.  Hepatitis B blood test.  Sexually transmitted disease (STD) testing.  Diabetes screening. This is done by checking your blood sugar (glucose) after you have not eaten for a while (fasting). You may have this done every 1-3 years.  Bone density scan. This is done to screen for osteoporosis. You may have this done starting at age 26.  Mammogram. This may be done every 1-2 years. Talk to your health care provider about how often you should have regular mammograms. Talk with your health care provider about your test results, treatment options, and if necessary, the need for more tests. Vaccines  Your health care provider may recommend certain vaccines, such as:  Influenza vaccine. This is recommended every year.  Tetanus, diphtheria, and acellular  pertussis (Tdap, Td) vaccine. You may need a Td booster every 10 years.  Zoster vaccine. You may need this after age 11.  Pneumococcal 13-valent conjugate (PCV13) vaccine. One dose is recommended after age 38.  Pneumococcal polysaccharide (PPSV23) vaccine. One dose is recommended after age  54. Talk to your health care provider about which screenings and vaccines you need and how often you need them. This information is not intended to replace advice given to you by your health care provider. Make sure you discuss any questions you have with your health care provider. Document Released: 02/20/2015 Document Revised: 10/14/2015 Document Reviewed: 11/25/2014 Elsevier Interactive Patient Education  2017 [place] Prevention in the Home Falls can cause injuries. They can happen to people of all ages. There are many things you can do to make your home safe and to help prevent falls. What can I do on the outside of my home?  Regularly fix the edges of walkways and driveways and fix any cracks.  Remove anything that might make you trip as you walk through a door, such as a raised step or threshold.  Trim any bushes or trees on the path to your home.  Use bright outdoor lighting.  Clear any walking paths of anything that might make someone trip, such as rocks or tools.  Regularly check to see if handrails are loose or broken. Make sure that both sides of any steps have handrails.  Any raised decks and porches should have guardrails on the edges.  Have any leaves, snow, or ice cleared regularly.  Use sand or salt on walking paths during winter.  Clean up any spills in your garage right away. This includes oil or grease spills. What can I do in the bathroom?  Use night lights.  Install grab bars by the toilet and in the tub and shower. Do not use towel bars as grab bars.  Use non-skid mats or decals in the tub or shower.  If you need to sit down in the shower, use a plastic, non-slip stool.  Keep the floor dry. Clean up any water that spills on the floor as soon as it happens.  Remove soap buildup in the tub or shower regularly.  Attach bath mats securely with double-sided non-slip rug tape.  Do not have throw rugs and other things on the floor that can make  you trip. What can I do in the bedroom?  Use night lights.  Make sure that you have a light by your bed that is easy to reach.  Do not use any sheets or blankets that are too big for your bed. They should not hang down onto the floor.  Have a firm chair that has side arms. You can use this for support while you get dressed.  Do not have throw rugs and other things on the floor that can make you trip. What can I do in the kitchen?  Clean up any spills right away.  Avoid walking on wet floors.  Keep items that you use a lot in easy-to-reach places.  If you need to reach something above you, use a strong step stool that has a grab bar.  Keep electrical cords out of the way.  Do not use floor polish or wax that makes floors slippery. If you must use wax, use non-skid floor wax.  Do not have throw rugs and other things on the floor that can make you trip. What can I do with my stairs?  Do not leave any items on the stairs.  Make sure that there are handrails on both sides of the stairs and use them. Fix handrails that are broken or loose. Make sure that handrails are as long as the stairways.  Check any carpeting to make sure that it is firmly attached to the stairs. Fix any carpet that is loose or worn.  Avoid having throw rugs at the top or bottom of the stairs. If you do have throw rugs, attach them to the floor with carpet tape.  Make sure that you have a light switch at the top of the stairs and the bottom of the stairs. If you do not have them, ask someone to add them for you. What else can I do to help prevent falls?  Wear shoes that:  Do not have high heels.  Have rubber bottoms.  Are comfortable and fit you well.  Are closed at the toe. Do not wear sandals.  If you use a stepladder:  Make sure that it is fully opened. Do not climb a closed stepladder.  Make sure that both sides of the stepladder are locked into place.  Ask someone to hold it for you, if  possible.  Clearly mark and make sure that you can see:  Any grab bars or handrails.  First and last steps.  Where the edge of each step is.  Use tools that help you move around (mobility aids) if they are needed. These include:  Canes.  Walkers.  Scooters.  Crutches.  Turn on the lights when you go into a dark area. Replace any light bulbs as soon as they burn out.  Set up your furniture so you have a clear path. Avoid moving your furniture around.  If any of your floors are uneven, fix them.  If there are any pets around you, be aware of where they are.  Review your medicines with your doctor. Some medicines can make you feel dizzy. This can increase your chance of falling. Ask your doctor what other things that you can do to help prevent falls. This information is not intended to replace advice given to you by your health care provider. Make sure you discuss any questions you have with your health care provider. Document Released: 11/20/2008 Document Revised: 07/02/2015 Document Reviewed: 02/28/2014 Elsevier Interactive Patient Education  2017 [employer].

## 2017-11-29 NOTE — Progress Notes (Signed)
Subjective:   Renee Waters is a 72 y.o. female who presents for Medicare Annual (Subsequent) preventive examination.  Review of Systems:   Cardiac Risk Factors include: hypertension;advanced age (>17men, >61 women);smoking/ tobacco exposure     Objective:     Vitals: BP 134/81   Pulse 61   Resp 12   Ht 5\' 5"  (1.651 m)   Wt 119 lb (54 kg)   SpO2 100%   BMI 19.80 kg/m   Body mass index is 19.8 kg/m.  Advanced Directives 11/29/2017 08/20/2017 02/08/2017 02/04/2016 09/09/2015 03/20/2014 03/18/2014  Does Patient Have a Medical Advance Directive? No No No No No;Yes No No  Type of Advance Directive - - - - Press photographer;Living will - -  Does patient want to make changes to medical advance directive? - - - Yes (MAU/Ambulatory/Procedural Areas - Information given) - - -  Copy of New Home in Chart? - - - - No - copy requested - -  Would patient like information on creating a medical advance directive? No - Patient declined No - Patient declined No - Patient declined - No - patient declined information No - patient declined information No - patient declined information  Pre-existing out of facility DNR order (yellow form or pink MOST form) - - - - - - -    Tobacco Social History   Tobacco Use  Smoking Status Former Smoker  . Packs/day: 0.25  . Years: 25.00  . Pack years: 6.25  . Types: Cigarettes  . Last attempt to quit: 02/07/2003  . Years since quitting: 14.8  Smokeless Tobacco Never Used  Tobacco Comment   quit in 2004     Counseling given: Not Answered Comment: quit in 2004   Clinical Intake:  Pre-visit preparation completed: Yes  Pain : 0-10 Pain Score: 2  Pain Type: Chronic pain Pain Location: Back Pain Orientation: Lower Pain Descriptors / Indicators: Aching Pain Onset: More than a month ago Pain Frequency: Constant Pain Relieving Factors: Gabapentin, oxycodone   Pain Relieving Factors: Gabapentin, oxycodone   BMI -  recorded: 19.8 Nutritional Status: BMI <19  Underweight Nutritional Risks: None Diabetes: No  How often do you need to have someone help you when you read instructions, pamphlets, or other written materials from your doctor or pharmacy?: 1 - Never What is the last grade level you completed in school?: 12  Interpreter Needed?: No  Information entered by :: Francena Hanly LPN   Past Medical History:  Diagnosis Date  . Acute cholangitis   . Allergy   . Anemia   . Anxiety   . Barrett's esophagus   . Cataract   . Chronic back pain   . Chronic neck pain   . Depression   . Genital herpes   . GERD (gastroesophageal reflux disease)   . History of blood transfusion   . Hypertension   . Insomnia   . Lupus (systemic lupus erythematosus) ([place])   . Neuromuscular disorder ([place])   . Osteoarthritis   . S/P colonoscopy June 2005   normal, no polyps  . S/P endoscopy June 2005, Oct 2009   2005: short-segment Barrett's, 2009: short-segment Barrett's  . UTI (lower urinary tract infection) 10/14   currently on Penicillin-  states is improving   Past Surgical History:  Procedure Laterality Date  . ABDOMINAL HYSTERECTOMY    . BACK SURGERY    . BIOPSY N/A 03/20/2014   Procedure: BIOPSY;  Surgeon: Daneil Dolin, MD;  Location: [facility];  Service: Endoscopy;  Laterality: N/A;  . BIOPSY  09/14/2015   Procedure: BIOPSY;  Surgeon: Daneil Dolin, MD;  Location: [facility];  Service: Endoscopy;;  esophageal and gastric  . CARPAL TUNNEL RELEASE Left 2013  . cervical disectomy  2002  . CHOLECYSTECTOMY     with lysis of adhesions for sbo; "ruptured gallbladder".  . COLONOSCOPY  11/09/2011   RMR: Melanosis coli  . COLONOSCOPY WITH PROPOFOL N/A 09/14/2015   Dr. Gala Romney: diverticulosis, 46mm TA removed. next TCS 09/2020.   . DENTAL SURGERY  11/2015   multiple tooth extraction  . ESOPHAGOGASTRODUODENOSCOPY  11/29/2007   salmon-colored  tongue   longest stable at  3 cm, distal esophagus as described  previously status post biopsy/ Hiatal hernia, otherwise normal stomach D1 and D2  . ESOPHAGOGASTRODUODENOSCOPY  01/06/11   short segment Barrett's esophagus s/p bx/Hiatal hernia  . ESOPHAGOGASTRODUODENOSCOPY (EGD) WITH PROPOFOL N/A 03/20/2014   SVX:[id number] distal esophagus short segment barrett's, bx with no dysplasia. next egd in 03/2017  . ESOPHAGOGASTRODUODENOSCOPY (EGD) WITH PROPOFOL N/A 09/14/2015   Dr. Gala Romney: Barrett's without dysplasia, gastritis benign bx, hiatal hernia. next EGD 09/2018.  Marland Kitchen HERNIA REPAIR Right 07/2010   Dr. Zada Girt  . JOINT REPLACEMENT    . LAPAROSCOPIC CHOLECYSTECTOMY  2017   at [facility]  . POLYPECTOMY  09/14/2015   Procedure: POLYPECTOMY;  Surgeon: Daneil Dolin, MD;  Location: [facility];  Service: Endoscopy;;  ascending colon  . right hip replacement  07/2010   went back in sept 2012 to fix  . SHOULDER ARTHROSCOPY  2008   left  . TOTAL HIP REVISION Right 12/17/2012   Procedure: RIGHT TOTAL HIP REVISION;  Surgeon: Mauri Pole, MD;  Location: [facility] ORS;  Service: Orthopedics;  Laterality: Right;  . WRIST SURGERY Right 2011   open reduction right wrist.   Family History  Problem Relation Age of Onset  . Hypertension Mother   . Stroke Mother   . Colon cancer Neg Hx   . Anesthesia problems Neg Hx   . Hypotension Neg Hx   . Malignant hyperthermia Neg Hx   . Pseudochol deficiency Neg Hx   . Gastric cancer Neg Hx   . Esophageal cancer Neg Hx    Social History   Socioeconomic History  . Marital status: Married    Spouse name: louis  . Number of children: 4  . Years of education: 12+  . Highest education level: Some college, no degree  Occupational History  . Occupation: Press photographer  - retired  . Occupation: non Multimedia programmer  . Financial resource strain: Not hard at all  . Food insecurity:    Worry: Never true    Inability: Never true  . Transportation needs:    Medical: No    Non-medical: No  Tobacco Use  . Smoking status: Former Smoker      Packs/day: 0.25    Years: 25.00    Pack years: 6.25    Types: Cigarettes    Last attempt to quit: 02/07/2003    Years since quitting: 14.8  . Smokeless tobacco: Never Used  . Tobacco comment: quit in 2004  Substance and Sexual Activity  . Alcohol use: No  . Drug use: No  . Sexual activity: Yes    Birth control/protection: Surgical  Lifestyle  . Physical activity:    Days per week: 2 days    Minutes per session: 40 min  . Stress: To some extent  Relationships  .  Social connections:    Talks on phone: More than three times a week    Gets together: Once a week    Attends religious service: Never    Active member of club or organization: No    Attends meetings of clubs or organizations: Never    Relationship status: Married  Other Topics Concern  . Not on file  Social History Narrative  . Not on file    Outpatient Encounter Medications as of 11/29/2017  Medication Sig  . acyclovir (ZOVIRAX) 800 MG tablet TAKE 1 TABLET BY MOUTH 4 TIMES DAILY AS NEEDED FOR  BREAKOUTS  . ALPRAZolam (XANAX) 0.5 MG tablet TAKE 1 TABLET BY MOUTH AT BEDTIME  . EPINEPHRINE 0.3 mg/0.3 mL IJ SOAJ injection INJECT 0.3 MLS INTO MUSCLE ONCE AS NEEDED  . gabapentin (NEURONTIN) 600 MG tablet Take 300 mg by mouth as needed.   . hydrochlorothiazide (HYDRODIURIL) 25 MG tablet Take 1 tablet (25 mg total) by mouth daily.  . Multiple Vitamins-Minerals (MULTIVITAMINS THER. W/MINERALS) TABS Take 1 tablet by mouth daily.   . Oxycodone HCl 10 MG TABS Take 1 tablet (10 mg total) by mouth 4 (four) times daily. (Patient taking differently: Take 10 mg by mouth every 6 (six) hours as needed (pain). )  . pantoprazole (PROTONIX) 40 MG tablet Take 1 tablet (40 mg total) by mouth daily before breakfast.  . potassium chloride (K-DUR) 10 MEQ tablet Take 1 tablet (10 mEq total) by mouth 2 (two) times daily.  Marland Kitchen senna (SENOKOT) 8.6 MG TABS tablet Take 2 tablets by mouth daily as needed for mild constipation.  . temazepam  (RESTORIL) 30 MG capsule TAKE 1 CAPSULE BY MOUTH AT BEDTIME AS NEEDED FOR SLEEP  . tiZANidine (ZANAFLEX) 4 MG tablet One tablet twice daily (Patient taking differently: Take 4 mg by mouth 2 (two) times daily as needed for muscle spasms. )   No facility-administered encounter medications on file as of 11/29/2017.     Activities of Daily Living In your present state of health, do you have any difficulty performing the following activities: 11/29/2017 08/20/2017  Hearing? N N  Vision? Y N  Difficulty concentrating or making decisions? Tempie Donning  Walking or climbing stairs? Y Y  Dressing or bathing? N Y  Comment - sometimes  Doing errands, shopping? N Y  Comment - sometimes  Conservation officer, nature and eating ? N -  Using the Toilet? N -  In the past six months, have you accidently leaked urine? N -  Do you have problems with loss of bowel control? N -  Managing your Medications? N -  Managing your Finances? N -  Housekeeping or managing your Housekeeping? N -  Some recent data might be hidden    Patient Care Team: [name], MD as PCP - General Rourk, [name], MD (Gastroenterology) Carole Civil, MD as Consulting Physician (Orthopedic Surgery)    Assessment:   This is a routine wellness examination for Mey[place].  Exercise Activities and Dietary recommendations Current Exercise Habits: Home exercise routine, Type of exercise: walking, Time (Minutes): 30, Frequency (Times/Week): 7, Weekly Exercise (Minutes/Week): 210, Intensity: Moderate, Exercise limited by: orthopedic condition(s)  Goals    . Patient Stated     [facility]     . Patient Stated     I have enough on my plate, but do want to also stay calm        Fall Risk Fall Risk  11/29/2017 11/09/2017 09/05/2017 08/15/2017 04/05/2017  Falls in the past year? Yes Yes No Yes Yes  Comment - - - - -  Number falls in past yr: 1 2 or more - 2 or more 2 or more  Injury with Fall? No - -  - No  Risk for fall due to : Impaired balance/gait;Impaired mobility;Impaired vision;Medication side effect - - - -  Follow up Falls prevention discussed - - - -   Is the patient's home free of loose throw rugs in walkways, pet beds, electrical cords, etc?   no      Grab bars in the bathroom? yes      Handrails on the stairs?   yes      Adequate lighting?   yes  Timed Get Up and Go performed: Patient able to perform within 8 seconds without assistance   Depression Screen PHQ 2/9 Scores 11/29/2017 11/09/2017 09/27/2017 09/05/2017  PHQ - 2 Score 1 1 0 0  PHQ- 9 Score 1 4 0 4     Cognitive Function     6CIT Screen 11/29/2017 02/08/2017 02/04/2016  What Year? 0 points 0 points 0 points  What month? 0 points 0 points 0 points  What time? 0 points 0 points 0 points  Count back from 20 0 points 0 points 0 points  Months in reverse 0 points 0 points 0 points  Repeat phrase 2 points 0 points 0 points  Total Score 2 0 0    Immunization History  Administered Date(s) Administered  . Influenza Split 11/08/2010, 12/06/2011  . Influenza Whole 11/06/2006, 11/24/2009  . Influenza, High Dose Seasonal PF 11/09/2017  . Influenza,inj,Quad PF,6+ Mos 10/31/2012, 01/14/2014, 11/25/2014, 10/05/2015, 11/24/2016  . Pneumococcal Conjugate-13 09/17/2013  . Pneumococcal Polysaccharide-23 02/22/2011  . Td 07/31/2007  . Tdap 09/05/2017  . Zoster 07/31/2007    Qualifies for Shingles Vaccine? Up to date   Screening Tests Health Maintenance  Topic Date Due  . MAMMOGRAM  04/06/2019  . COLONOSCOPY  09/13/2025  . TETANUS/TDAP  09/06/2027  . INFLUENZA VACCINE  Completed  . DEXA SCAN  Completed  . Hepatitis C Screening  Completed  . PNA vac Low Risk Adult  Completed    Cancer Screenings: Lung: Low Dose CT Chest recommended if Age 51-80 years, 30 pack-year currently smoking OR have quit w/in 15years. Patient does not qualify. Breast:  Up to date on Mammogram? Yes   Up to date of Bone Density/Dexa?  Yes Colorectal: up to date   Additional Screenings: Hepatitis C Screening: complete      Plan:   Continue to be as active as possible, volunteer work and getting together with family   I have personally reviewed and noted the following in the patient's chart:   . Medical and social history . Use of alcohol, tobacco or illicit drugs  . Current medications and supplements . Functional ability and status . Nutritional status . Physical activity . Advanced directives . List of other physicians . Hospitalizations, surgeries, and ER visits in previous 12 months . Vitals . Screenings to include cognitive, depression, and falls . Referrals and appointments  In addition, I have reviewed and discussed with patient certain preventive protocols, quality metrics, and best practice recommendations. A written personalized care plan for preventive services as well as general preventive health recommendations were provided to patient.     Francoise Schaumann, LPN  [id number]

## 2017-12-18 ENCOUNTER — Other Ambulatory Visit: Payer: Self-pay | Admitting: Orthopedic Surgery

## 2017-12-18 DIAGNOSIS — M1712 Unilateral primary osteoarthritis, left knee: Secondary | ICD-10-CM

## 2017-12-22 DIAGNOSIS — M47812 Spondylosis without myelopathy or radiculopathy, cervical region: Secondary | ICD-10-CM | POA: Diagnosis not present

## 2017-12-22 DIAGNOSIS — M47816 Spondylosis without myelopathy or radiculopathy, lumbar region: Secondary | ICD-10-CM | POA: Diagnosis not present

## 2017-12-22 DIAGNOSIS — G894 Chronic pain syndrome: Secondary | ICD-10-CM | POA: Diagnosis not present

## 2017-12-22 DIAGNOSIS — Z79891 Long term (current) use of opiate analgesic: Secondary | ICD-10-CM | POA: Diagnosis not present

## 2017-12-25 ENCOUNTER — Ambulatory Visit: Payer: Self-pay | Admitting: Gastroenterology

## 2018-01-09 ENCOUNTER — Encounter: Payer: Self-pay | Admitting: Family Medicine

## 2018-01-09 ENCOUNTER — Ambulatory Visit (INDEPENDENT_AMBULATORY_CARE_PROVIDER_SITE_OTHER): Payer: Medicare Other | Admitting: Family Medicine

## 2018-01-09 ENCOUNTER — Telehealth: Payer: Self-pay | Admitting: Family Medicine

## 2018-01-09 VITALS — BP 110/70 | HR 80 | Resp 12 | Ht 65.0 in | Wt 120.1 lb

## 2018-01-09 DIAGNOSIS — M25551 Pain in right hip: Secondary | ICD-10-CM | POA: Diagnosis not present

## 2018-01-09 DIAGNOSIS — J209 Acute bronchitis, unspecified: Secondary | ICD-10-CM

## 2018-01-09 DIAGNOSIS — R52 Pain, unspecified: Secondary | ICD-10-CM

## 2018-01-09 DIAGNOSIS — I10 Essential (primary) hypertension: Secondary | ICD-10-CM | POA: Diagnosis not present

## 2018-01-09 MED ORDER — BENZONATATE 100 MG PO CAPS: 100.0000 mg | ORAL_CAPSULE | Freq: Two times a day (BID) | ORAL | 0 refills | Status: DC | PRN

## 2018-01-09 MED ORDER — PENICILLIN V POTASSIUM 500 MG PO TABS: 500.0000 mg | ORAL_TABLET | Freq: Three times a day (TID) | ORAL | 0 refills | Status: DC

## 2018-01-09 NOTE — Assessment & Plan Note (Addendum)
1 month h/o right hip pain and instability refer to orthopedics

## 2018-01-09 NOTE — Assessment & Plan Note (Signed)
2 week h/o cough and chest congestion , penicillin and tessalon perle prescribed

## 2018-01-09 NOTE — Patient Instructions (Signed)
F/u as before   You are treated for acute bronchitis  You  Are referred  To Orthopedics for evaluation of your hip  Thanks for choosing [facility], we consider it a privelige to serve you.

## 2018-01-09 NOTE — Progress Notes (Signed)
   Renee Waters     MRN: [id number]      DOB: October 02, 1945   HPI [name] is here for  10 day  h/o worsening  chest congestion, associated with fever and chills intermittently.  Sputum is thick and yellow. Increasing fatigue , poor appetitie and sleep disturbed by cough. No improvement with OTC medication. C/o increased right hip pain limiting mobility  ROS See HPI   Denies chest pains, palpitations and leg swelling Denies abdominal pain, nausea, vomiting,diarrhea or constipation.   Denies dysuria, frequency, hesitancy or incontinence. .Adequate control of joinrt pain, and she is reducing amount of medication she takes, increased right hip pain no aggravating trauma, reducing mobility Denies headaches, seizures, numbness, or tingling. Denies uncontrolled depression, anxiety or insomnia. Denies skin break down or rash.   PE  BP 110/70 (BP Location: Right Arm, Patient Position: Sitting, Cuff Size: Normal)   Pulse 80   Resp 12   Ht 5\' 5"  (1.651 m)   Wt 120 lb 1.9 oz (54.5 kg)   SpO2 99% Comment: room air  BMI 19.99 kg/m   Patient alert and ill appearing  and in no cardiopulmonary distress.  HEENT: No facial asymmetry, EOMI,   oropharynx pink and moist.  Neck supple no JVD, no mass.no sinus tenderness, TM clear  Chest:decreased air entry, scattered bilateral crackles and scattered wheezes  CVS: S1, S2 no murmurs, no S3.Regular rate.  ABD: Soft non tender.   Ext: No edema  MS: Decreased  ROM spine, shoulders,  and knees.Markedly decreased ROM right hip  Skin: Intact, no ulcerations or rash noted.  Psych: Good eye contact, normal affect. Memory intact not anxious or depressed appearing.  CNS: CN 2-12 intact, power,  normal throughout.no focal deficits noted.   Assessment & Plan  Hip pain 1 month h/o right hip pain and instability refer to orthopedics  Acute bronchitis 2 week h/o cough and chest congestion , penicillin and tessalon perle  prescribed  Essential hypertension Controlled, no change in medication DASH diet and commitment to daily physical activity for a minimum of 30 minutes discussed and encouraged, as a part of hypertension management. The importance of attaining a healthy weight is also discussed.  BP/Weight 01/09/2018 11/29/2017 11/09/2017 09/21/2017 09/05/2017 08/22/2017 08/04/3660  Systolic BP 947 654 650 354 656 812 -  Diastolic BP 70 81 80 67 70 76 -  Wt. (Lbs) 120.12 119 114 116.8 117.04 117 114  BMI 19.99 19.8 18.97 19.44 19.48 - 18.97  Some encounter information is confidential and restricted. Go to Review Flowsheets activity to see all data.       Generalized pain Managed by pain clinic, reports reduced dependence on opioids by intent

## 2018-01-09 NOTE — Telephone Encounter (Signed)
Spoke with patient and advised her to continue to take all of her medications with verbal understanding.

## 2018-01-09 NOTE — Telephone Encounter (Signed)
Pt is calling in wants to know if she should stop the potassium as there is potassium in the penicillen

## 2018-01-13 ENCOUNTER — Encounter: Payer: Self-pay | Admitting: Family Medicine

## 2018-01-13 NOTE — Assessment & Plan Note (Signed)
Controlled, no change in medication DASH diet and commitment to daily physical activity for a minimum of 30 minutes discussed and encouraged, as a part of hypertension management. The importance of attaining a healthy weight is also discussed.  BP/Weight 01/09/2018 11/29/2017 11/09/2017 09/21/2017 09/05/2017 08/22/2017 06/07/1482  Systolic BP 039 795 369 223 009 794 -  Diastolic BP 70 81 80 67 70 76 -  Wt. (Lbs) 120.12 119 114 116.8 117.04 117 114  BMI 19.99 19.8 18.97 19.44 19.48 - 18.97  Some encounter information is confidential and restricted. Go to Review Flowsheets activity to see all data.

## 2018-01-13 NOTE — Assessment & Plan Note (Signed)
Managed by pain clinic, reports reduced dependence on opioids by intent

## 2018-01-18 ENCOUNTER — Ambulatory Visit (INDEPENDENT_AMBULATORY_CARE_PROVIDER_SITE_OTHER): Payer: Medicare Other | Admitting: Family Medicine

## 2018-01-18 ENCOUNTER — Encounter: Payer: Self-pay | Admitting: Family Medicine

## 2018-01-18 ENCOUNTER — Other Ambulatory Visit (HOSPITAL_COMMUNITY)
Admission: RE | Admit: 2018-01-18 | Discharge: 2018-01-18 | Disposition: A | Discharge status: Home / Self Care | Payer: Medicare Other | Source: Ambulatory Visit | Attending: Family Medicine | Admitting: Family Medicine

## 2018-01-18 VITALS — BP 110/50 | HR 67 | Resp 12 | Ht 65.0 in | Wt 120.1 lb

## 2018-01-18 DIAGNOSIS — N760 Acute vaginitis: Secondary | ICD-10-CM | POA: Diagnosis not present

## 2018-01-18 DIAGNOSIS — Z0000 Encounter for general adult medical examination without abnormal findings: Secondary | ICD-10-CM

## 2018-01-18 NOTE — Progress Notes (Signed)
    Renee Waters     MRN: [id number]      DOB: 09/02/1945  HPI: Patient is in for annual physical exam. C/o poor urinary stream , feels as though she wants  To go, then the stream is nil, and then eventually excessive urine x 6 month C/o intermittent malodorous vaginal discharge and douches for relief, finds this irritating to her vagina PE: BP (!) 110/50 (BP Location: Right Arm, Patient Position: Sitting, Cuff Size: Normal)   Pulse 67   Resp 12   Ht 5\' 5"  (1.651 m)   Wt 120 lb 1.9 oz (54.5 kg)   SpO2 98% Comment: room air  BMI 19.99 kg/m   Pleasant  female, alert and oriented x 3, in no cardio-pulmonary distress. Afebrile. HEENT No facial trauma or asymetry. Sinuses non tender.  Extra occullar muscles intact, pupils equally reactive to light. External ears normal, tympanic membranes clear. Oropharynx moist, no exudate. Neck: supple, no adenopathy,JVD or thyromegaly.No bruits.  Chest: Clear to ascultation bilaterally.No crackles or wheezes. Non tender to palpation  Breast: No asymetry,no masses or lumps. No tenderness. No nipple discharge or inversion. No axillary or supraclavicular adenopathy  Cardiovascular system; Heart sounds normal,  S1 and  S2 ,no S3.  No murmur, or thrill. Apical beat not displaced Peripheral pulses normal.  Abdomen: Soft, lower abdominal tenderness to deep palpation, no organomegaly or masses. No bruits. Bowel sounds normal. No guarding, tenderness or rebound.  GU: Not examined, pt declined , stating that the introitus is tender , denies current herpes flare   Musculoskeletal exam: Decreased  ROM of spine,right  Hip and normal in  , shoulders and knees. No deformity ,swelling or crepitus noted.  Neurologic: Cranial nerves 2 to 12 intact. Power, tone ,sensation and reflexes normal throughout. No disturbance in gait. No tremor.  Skin: Intact, no ulceration, erythema , scaling or rash noted. Pigmentation normal  throughout  Psych; Normal mood and affect. Judgement and concentration normal   Assessment & Plan:  Annual physical exam Annual exam as documented. Counseling done  re healthy lifestyle involving commitment to 150 minutes exercise per week, heart healthy diet, and attaining healthy weight.The importance of adequate sleep also discussed.  Immunization and cancer screening needs are specifically addressed at this visit.   Vaginitis and vulvovaginitis Symptomatic x months, douching irritative and not curative. I explained that douching was causing and not relieving the problem and recommended she discontinue. Urine specimen sent for wet prep

## 2018-01-18 NOTE — Patient Instructions (Signed)
F/U in 5 months, call if you need me before  Urine to be sent for testing today as we discussed  Exam is very good, and I wish you aLL the very best for 2020 and beyond! Thank you  for choosing [facility]. We consider it a privelige to serve you.  Delivering excellent health care in a caring and  compassionate way is our goal.  Partnering with you,  so that together we can achieve this goal is our strategy.

## 2018-01-19 ENCOUNTER — Encounter: Payer: Self-pay | Admitting: Family Medicine

## 2018-01-19 DIAGNOSIS — M25552 Pain in left hip: Secondary | ICD-10-CM | POA: Diagnosis not present

## 2018-01-19 DIAGNOSIS — M7061 Trochanteric bursitis, right hip: Secondary | ICD-10-CM | POA: Diagnosis not present

## 2018-01-19 NOTE — Assessment & Plan Note (Signed)
Symptomatic x months, douching irritative and not curative. I explained that douching was causing and not relieving the problem and recommended she discontinue. Urine specimen sent for wet prep

## 2018-01-19 NOTE — Assessment & Plan Note (Signed)
Annual exam as documented. Counseling done  re healthy lifestyle involving commitment to 150 minutes exercise per week, heart healthy diet, and attaining healthy weight.The importance of adequate sleep also discussed.  Immunization and cancer screening needs are specifically addressed at this visit.  

## 2018-01-22 DIAGNOSIS — Z79891 Long term (current) use of opiate analgesic: Secondary | ICD-10-CM | POA: Diagnosis not present

## 2018-01-22 DIAGNOSIS — M47816 Spondylosis without myelopathy or radiculopathy, lumbar region: Secondary | ICD-10-CM | POA: Diagnosis not present

## 2018-01-22 DIAGNOSIS — G894 Chronic pain syndrome: Secondary | ICD-10-CM | POA: Diagnosis not present

## 2018-01-22 DIAGNOSIS — M47812 Spondylosis without myelopathy or radiculopathy, cervical region: Secondary | ICD-10-CM | POA: Diagnosis not present

## 2018-01-22 LAB — URINE CYTOLOGY ANCILLARY ONLY
Bacterial vaginitis: NEGATIVE
Candida vaginitis: NEGATIVE

## 2018-01-23 ENCOUNTER — Telehealth: Payer: Self-pay | Admitting: Family Medicine

## 2018-01-23 NOTE — Telephone Encounter (Signed)
Pt is returning our call 

## 2018-01-26 ENCOUNTER — Other Ambulatory Visit: Payer: Self-pay | Admitting: Family Medicine

## 2018-02-06 ENCOUNTER — Encounter: Payer: Self-pay | Admitting: Family Medicine

## 2018-02-06 ENCOUNTER — Ambulatory Visit (INDEPENDENT_AMBULATORY_CARE_PROVIDER_SITE_OTHER): Payer: Medicare Other | Admitting: Licensed Clinical Social Worker

## 2018-02-06 ENCOUNTER — Encounter (HOSPITAL_COMMUNITY): Payer: Self-pay | Admitting: Licensed Clinical Social Worker

## 2018-02-06 ENCOUNTER — Encounter

## 2018-02-06 DIAGNOSIS — F331 Major depressive disorder, recurrent, moderate: Secondary | ICD-10-CM

## 2018-02-06 NOTE — Progress Notes (Signed)
   THERAPIST PROGRESS NOTE  Session Time: 2:00 pm -2:45 pm  Participation Level: Active  Behavioral Response: CasualAlertDepressed  Type of Therapy: Individual Therapy  Treatment Goals addressed: Coping  Interventions: CBT and Solution Focused  Summary: Renee Waters is a 72 y.o. female who presents oriented x5 (person, place, situation, time and object), alert, depressed, tearful, average height, thin, and cooperative to address mood. Patient has a history of medical treatment including hypertension and chronic pain. Patient has minimal history of mental health including outpatient therapy several years ago and medication management. Patient denies symptoms of mania. Patient denies suicidal and homicidal ideations. Patient denies psychosis including auditory and visual hallucinations. Patient denies substance use. Patient is at low risk for lethality at this time.   Physically: Patient feels tired. She is worn down.  Spiritually/values: No issues identified.  Relationships: Patient feels like her husband is ignoring her. She doesn't get his attention any more. She feels like she is just there to cook and clean.  Emotional/Mental/Behavior: Patient's mood has been depressed. She feels like her family is not acknowledging her. Patient's son didn't give her a card for Christmas and her husband picked out "random" presents with no thought. Patient recognizes that she has a lot of things going well for her. She understands this feeling won't last forever. Patient understands that she needs to take care of herself.    Patient engaged in session. She responded well to interventions. Patient continues to meet criteria for Major depressive disorder, recurrent episode, moderate with anxious distress. Patient will continue in outpatient therapy due to being the least restrictive service to meet her needs. Patient made moderate progress on her goals.   Suicidal/Homicidal: Negativewithout  intent/plan  Therapist Response: Therapist reviewed patient's recent thoughts and behaviors. Therapist utilized CBT to address mood. Therapist reviewed patient's goals. Therapist processed patient's feelings to identify triggers for mood. Therapist explored patient's relationship with her husband.   Plan: Return again in 4 weeks.  Diagnosis: Axis I: Major depressive disorder, recurrent episode, moderate with anxious distress    Axis II: No diagnosis    Glori Bickers, LCSW 02/06/2018

## 2018-02-19 DIAGNOSIS — M47816 Spondylosis without myelopathy or radiculopathy, lumbar region: Secondary | ICD-10-CM | POA: Diagnosis not present

## 2018-02-19 DIAGNOSIS — M47812 Spondylosis without myelopathy or radiculopathy, cervical region: Secondary | ICD-10-CM | POA: Diagnosis not present

## 2018-02-19 DIAGNOSIS — Z79899 Other long term (current) drug therapy: Secondary | ICD-10-CM | POA: Diagnosis not present

## 2018-02-19 DIAGNOSIS — Z79891 Long term (current) use of opiate analgesic: Secondary | ICD-10-CM | POA: Diagnosis not present

## 2018-02-19 DIAGNOSIS — G894 Chronic pain syndrome: Secondary | ICD-10-CM | POA: Diagnosis not present

## 2018-02-28 ENCOUNTER — Other Ambulatory Visit (HOSPITAL_COMMUNITY): Payer: Self-pay | Admitting: Family Medicine

## 2018-02-28 ENCOUNTER — Other Ambulatory Visit: Payer: Self-pay | Admitting: Family Medicine

## 2018-02-28 DIAGNOSIS — Z1231 Encounter for screening mammogram for malignant neoplasm of breast: Secondary | ICD-10-CM

## 2018-03-01 ENCOUNTER — Other Ambulatory Visit: Payer: Self-pay | Admitting: Family Medicine

## 2018-03-01 MED ORDER — TEMAZEPAM 30 MG PO CAPS: 30.0000 mg | ORAL_CAPSULE | Freq: Every evening | ORAL | 5 refills | Status: DC | PRN

## 2018-03-01 MED ORDER — ALPRAZOLAM 0.5 MG PO TABS: 0.5000 mg | ORAL_TABLET | Freq: Every day | ORAL | 5 refills | Status: DC

## 2018-03-05 ENCOUNTER — Other Ambulatory Visit: Payer: Self-pay | Admitting: Family Medicine

## 2018-03-20 DIAGNOSIS — M47816 Spondylosis without myelopathy or radiculopathy, lumbar region: Secondary | ICD-10-CM | POA: Diagnosis not present

## 2018-03-20 DIAGNOSIS — G894 Chronic pain syndrome: Secondary | ICD-10-CM | POA: Diagnosis not present

## 2018-03-20 DIAGNOSIS — M47812 Spondylosis without myelopathy or radiculopathy, cervical region: Secondary | ICD-10-CM | POA: Diagnosis not present

## 2018-03-20 DIAGNOSIS — Z79891 Long term (current) use of opiate analgesic: Secondary | ICD-10-CM | POA: Diagnosis not present

## 2018-03-21 ENCOUNTER — Encounter (HOSPITAL_COMMUNITY): Payer: Self-pay | Admitting: Licensed Clinical Social Worker

## 2018-03-21 ENCOUNTER — Ambulatory Visit (INDEPENDENT_AMBULATORY_CARE_PROVIDER_SITE_OTHER): Payer: Medicare Other | Admitting: Licensed Clinical Social Worker

## 2018-03-21 DIAGNOSIS — F331 Major depressive disorder, recurrent, moderate: Secondary | ICD-10-CM

## 2018-03-21 NOTE — Progress Notes (Signed)
   THERAPIST PROGRESS NOTE  Session Time: 4:00 pm -4:45 pm  Participation Level: Active  Behavioral Response: CasualAlertDepressed  Type of Therapy: Individual Therapy  Treatment Goals addressed: Coping  Interventions: CBT and Solution Focused  Summary: Renee Waters is a 73 y.o. female who presents oriented x5 (person, place, situation, time and object), alert, depressed, tearful, average height, thin, and cooperative to address mood. Patient has a history of medical treatment including hypertension and chronic pain. Patient has minimal history of mental health including outpatient therapy several years ago and medication management. Patient denies symptoms of mania. Patient denies suicidal and homicidal ideations. Patient denies psychosis including auditory and visual hallucinations. Patient denies substance use. Patient is at low risk for lethality at this time.   Physically: Patient is struggling with her physical health. Her pain level is up. She has not been able to do as much.   Spiritually/values: No issues identified.  Relationships: Patient is getting along with her husband she is picking her battles.  Emotional/Mental/Behavior: Patient's mood has been stable. She is getting along with others. Patient is trying to manage her pain.    Patient engaged in session. She responded well to interventions. Patient continues to meet criteria for Major depressive disorder, recurrent episode, moderate with anxious distress. Patient will continue in outpatient therapy due to being the least restrictive service to meet her needs. Patient made moderate progress on her goals.   Suicidal/Homicidal: Negativewithout intent/plan  Therapist Response: Therapist reviewed patient's recent thoughts and behaviors. Therapist utilized CBT to address mood. Therapist reviewed patient's goals. Therapist processed patient's feelings to identify triggers for mood. Therapist explored patient's patient's pain  level.    Plan: Return again in 4 weeks.  Diagnosis: Axis I: Major depressive disorder, recurrent episode, moderate with anxious distress    Axis II: No diagnosis    Glori Bickers, LCSW 03/21/2018

## 2018-03-22 ENCOUNTER — Telehealth: Payer: Self-pay | Admitting: Family Medicine

## 2018-03-22 NOTE — Telephone Encounter (Signed)
Pt called and LVM for [place] to call her back, when I called her back she stated that she had already talked to the nurse.  I asked her was  She sure, and she said yes. I am the only one answering the phones, and I have not talked to her this am.
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
## 2018-03-26 ENCOUNTER — Telehealth: Payer: Self-pay | Admitting: Family Medicine

## 2018-03-26 NOTE — Telephone Encounter (Signed)
PT LVM regarding a billing issues.   Called the PT back LVM that she would need to reach out to [phone]

## 2018-03-27 ENCOUNTER — Ambulatory Visit: Payer: Medicare Other | Admitting: Gastroenterology

## 2018-03-29 ENCOUNTER — Ambulatory Visit (HOSPITAL_COMMUNITY): Payer: Medicare Other | Admitting: Psychiatry

## 2018-04-09 ENCOUNTER — Ambulatory Visit (HOSPITAL_COMMUNITY)
Admission: RE | Admit: 2018-04-09 | Discharge: 2018-04-09 | Disposition: A | Discharge status: Home / Self Care | Payer: Medicare Other | Source: Ambulatory Visit | Attending: Family Medicine | Admitting: Family Medicine

## 2018-04-09 DIAGNOSIS — Z1231 Encounter for screening mammogram for malignant neoplasm of breast: Secondary | ICD-10-CM | POA: Insufficient documentation

## 2018-04-16 DIAGNOSIS — M47816 Spondylosis without myelopathy or radiculopathy, lumbar region: Secondary | ICD-10-CM | POA: Diagnosis not present

## 2018-04-16 DIAGNOSIS — M47812 Spondylosis without myelopathy or radiculopathy, cervical region: Secondary | ICD-10-CM | POA: Diagnosis not present

## 2018-04-16 DIAGNOSIS — G894 Chronic pain syndrome: Secondary | ICD-10-CM | POA: Diagnosis not present

## 2018-04-16 DIAGNOSIS — Z79891 Long term (current) use of opiate analgesic: Secondary | ICD-10-CM | POA: Diagnosis not present

## 2018-04-25 ENCOUNTER — Other Ambulatory Visit: Payer: Self-pay | Admitting: Family Medicine

## 2018-04-26 ENCOUNTER — Ambulatory Visit (HOSPITAL_COMMUNITY): Payer: Medicare Other | Admitting: Licensed Clinical Social Worker

## 2018-05-09 ENCOUNTER — Telehealth: Payer: Self-pay | Admitting: *Deleted

## 2018-05-09 NOTE — Telephone Encounter (Signed)
Pt is advised to go for appt also advised to clean hands try to social distance to keep her fears away. She stated she would go to this appt

## 2018-05-09 NOTE — Telephone Encounter (Signed)
She is in a pain contract with Dr. Hardin Negus and I recommend that she follow through on what he requires, please let her know

## 2018-05-09 NOTE — Telephone Encounter (Signed)
Can you please call patient and let her know Dr.Simpsons response? (she has a block on her phone that blocks restricted numbers)

## 2018-05-09 NOTE — Telephone Encounter (Signed)
Pt called in stating she had an appt coming up with Dr. Hardin Negus in [place].  It was for pain management. They told her that she would need to come in the office for this appt. She stated she does not want to go to the appt as the Covid 19 and they also told her they would not fill her meds unless she came in. Wanted to know what Dr. Moshe Cipro recommended.

## 2018-05-11 ENCOUNTER — Ambulatory Visit (HOSPITAL_COMMUNITY): Payer: Medicare Other | Admitting: Licensed Clinical Social Worker

## 2018-05-14 ENCOUNTER — Ambulatory Visit (INDEPENDENT_AMBULATORY_CARE_PROVIDER_SITE_OTHER): Payer: Medicare Other | Admitting: Licensed Clinical Social Worker

## 2018-05-14 ENCOUNTER — Encounter: Payer: Self-pay | Admitting: Gastroenterology

## 2018-05-14 ENCOUNTER — Encounter (HOSPITAL_COMMUNITY): Payer: Self-pay | Admitting: Licensed Clinical Social Worker

## 2018-05-14 ENCOUNTER — Encounter: Payer: Self-pay | Admitting: Internal Medicine

## 2018-05-14 ENCOUNTER — Ambulatory Visit (INDEPENDENT_AMBULATORY_CARE_PROVIDER_SITE_OTHER): Payer: Medicare Other | Admitting: Gastroenterology

## 2018-05-14 ENCOUNTER — Other Ambulatory Visit: Payer: Self-pay

## 2018-05-14 DIAGNOSIS — K2270 Barrett's esophagus without dysplasia: Secondary | ICD-10-CM | POA: Diagnosis not present

## 2018-05-14 DIAGNOSIS — F331 Major depressive disorder, recurrent, moderate: Secondary | ICD-10-CM

## 2018-05-14 DIAGNOSIS — K5900 Constipation, unspecified: Secondary | ICD-10-CM | POA: Diagnosis not present

## 2018-05-14 NOTE — Progress Notes (Signed)
Primary Care Physician:  [name], MD  Primary GI: Dr. Gala Romney   Virtual Visit via Telephone Note Due to COVID-19, visit is conducted virtually and was requested by patient.   I connected with Renee Waters on 05/14/18 at 11:00 AM EDT by telephone and verified that I am speaking with the correct person using two identifiers.   I discussed the limitations, risks, security and privacy concerns of performing an evaluation and management service by telephone and the availability of in person appointments. I also discussed with the patient that there may be a patient responsible charge related to this service. The patient expressed understanding and agreed to proceed.  Chief Complaint  Patient presents with  . Abdominal Pain    feels knot under rib cage if she bends over     History of Present Illness: Renee Waters 73 year old female with history of Barrett's esophagus, opioid-induced constipation, for routine follow-up. Last physically seen in office Aug 2019.   Using Miralax in morning with coffee. Doesn't take anything for pain till after eating, then has a BM. Movantik too expensive in the past. Occasional reflux. Not sure what triggers it. PPI only as needed. States she will take it daily after we discussed need for PPI in setting of Barrett's.   When bending over or movement, causes a knot-type of feeling in upper abdomen. Occurs 3-4 times per year. Lasts about a minute and then will come itself down. Feels a bulging.   Past Medical History:  Diagnosis Date  . Acute cholangitis   . Allergy   . Anemia   . Anxiety   . Barrett's esophagus   . Cataract   . Chronic back pain   . Chronic neck pain   . Depression   . Genital herpes   . GERD (gastroesophageal reflux disease)   . History of blood transfusion   . Hypertension   . Insomnia   . Lupus (systemic lupus erythematosus) ([place])   . Neuromuscular disorder ([place])   . Osteoarthritis   . S/P colonoscopy June 2005   normal, no polyps  . S/P endoscopy June 2005, Oct 2009   2005: short-segment Barrett's, 2009: short-segment Barrett's  . UTI (lower urinary tract infection) 11/2012     Past Surgical History:  Procedure Laterality Date  . ABDOMINAL HYSTERECTOMY    . BACK SURGERY    . BIOPSY N/A 03/20/2014   Procedure: BIOPSY;  Surgeon: Daneil Dolin, MD;  Location: [facility];  Service: Endoscopy;  Laterality: N/A;  . BIOPSY  09/14/2015   Procedure: BIOPSY;  Surgeon: Daneil Dolin, MD;  [facility];  Service: Endoscopy;;  esophageal and gastric  . CARPAL TUNNEL RELEASE Left 2013  . cervical disectomy  2002  . CHOLECYSTECTOMY     with lysis of adhesions for sbo; "ruptured gallbladder".  . COLONOSCOPY  11/09/2011   RMR: Melanosis coli  . COLONOSCOPY WITH PROPOFOL N/A 09/14/2015   Dr. Gala Romney: diverticulosis, 21mm TA removed. next TCS 09/2020.   . DENTAL SURGERY  11/2015   multiple tooth extraction  . ESOPHAGOGASTRODUODENOSCOPY  11/29/2007   salmon-colored  tongue   longest stable at  3 cm, distal esophagus as described previously status post biopsy/ Hiatal hernia, otherwise normal stomach D1 and D2  . ESOPHAGOGASTRODUODENOSCOPY  01/06/11   short segment Barrett's esophagus s/p bx/Hiatal hernia  . ESOPHAGOGASTRODUODENOSCOPY (EGD) WITH PROPOFOL N/A 03/20/2014   [id number] distal esophagus short segment barrett's, bx with no dysplasia. next egd in 03/2017  . ESOPHAGOGASTRODUODENOSCOPY (  EGD) WITH PROPOFOL N/A 09/14/2015   Dr. Gala Romney: Barrett's without dysplasia, gastritis benign bx, hiatal hernia. next EGD 09/2018.  Marland Kitchen HERNIA REPAIR Right 07/2010   Dr. Zada Girt  . JOINT REPLACEMENT    . LAPAROSCOPIC CHOLECYSTECTOMY  2017   at [facility]  . POLYPECTOMY  09/14/2015   Procedure: POLYPECTOMY;  Surgeon: Daneil Dolin, MD;  [facility];  Service: Endoscopy;;  ascending colon  . right hip replacement  07/2010   went back in sept 2012 to fix  . SHOULDER ARTHROSCOPY  2008   left  . TOTAL HIP REVISION  Right 12/17/2012   Procedure: RIGHT TOTAL HIP REVISION;  Surgeon: Mauri Pole, MD;  Location: [facility];  Service: Orthopedics;  Laterality: Right;  . WRIST SURGERY Right 2011   open reduction right wrist.     Current Meds  Medication Sig  . acyclovir (ZOVIRAX) 800 MG tablet TAKE 1 TABLET BY MOUTH 4 TIMES DAILY AS NEEDED FOR  BREAKOUTS  . ALPRAZolam (XANAX) 0.5 MG tablet TAKE 1 TABLET BY MOUTH AT BEDTIME  . ALPRAZolam (XANAX) 0.5 MG tablet Take 1 tablet (0.5 mg total) by mouth at bedtime.  Marland Kitchen EPINEPHRINE 0.3 mg/0.3 mL IJ SOAJ injection INJECT 0.3 MLS INTO MUSCLE ONCE AS NEEDED  . gabapentin (NEURONTIN) 300 MG capsule TAKE ONE CAPSULE BY MOUTH ONCE DAILY FOR  CHRONIC  PAIN  (DOSE  REDUCTION)  . hydrochlorothiazide (HYDRODIURIL) 25 MG tablet Take 1 tablet (25 mg total) by mouth daily.  . meloxicam (MOBIC) 7.5 MG tablet TAKE 1 TABLET BY MOUTH ONCE DAILY  . Multiple Vitamins-Minerals (MULTIVITAMINS THER. W/MINERALS) TABS Take 1 tablet by mouth daily.   . Oxycodone HCl 10 MG TABS Take 1 tablet (10 mg total) by mouth 4 (four) times daily. (Patient taking differently: Take 10 mg by mouth every 6 (six) hours as needed (pain). )  . pantoprazole (PROTONIX) 40 MG tablet TAKE 1 TABLET BY MOUTH ONCE DAILY BEFORE BREAKFAST  . potassium chloride (K-DUR) 10 MEQ tablet Take 1 tablet (10 mEq total) by mouth 2 (two) times daily.  Marland Kitchen senna (SENOKOT) 8.6 MG TABS tablet Take 2 tablets by mouth daily as needed for mild constipation.  . temazepam (RESTORIL) 30 MG capsule Take 1 capsule (30 mg total) by mouth at bedtime as needed for sleep.  Marland Kitchen tiZANidine (ZANAFLEX) 4 MG tablet One tablet twice daily (Patient taking differently: Take 4 mg by mouth 2 (two) times daily as needed for muscle spasms. )       Observations/Objective: No distress. Unable to perform physical exam due to telephone encounter. No video available.   Assessment and Plan: Barrett's esophagus with symptomatic GERD symptoms overall controlled for  most part; however, she has only been taking PPI every other day. Discussed daily dosing indefinitely due to history. Will need surveillance EGD later this year. Will have her return in July 2020.   OIC: actually doing well with Miralax daily.   Follow Up Instructions: Continue Protonix once each morning, 30 minutes before breakfast. As you have a history of Barrett's esophagus, it is important to take the reflux medication daily.  We will see you in the office July 2020!  Please call if further abdominal discomfort or issues with constipation!    I discussed the assessment and treatment plan with the patient. The patient was provided an opportunity to ask questions and all were answered. The patient agreed with the plan and demonstrated an understanding of the instructions.   The patient was advised  to call back or seek an in-person evaluation if the symptoms worsen or if the condition fails to improve as anticipated.  I provided 15 minutes of non-face-to-face time during this encounter.  Annitta Needs, PhD, ANP-BC [facility]

## 2018-05-14 NOTE — Progress Notes (Signed)
Virtual Visit via Telephone Note  I connected with Renee Waters on 05/14/18 at  3:00 PM EDT by telephone and verified that I am speaking with the correct person using two identifiers.   I discussed the limitations, risks, security and privacy concerns of performing an evaluation and management service by telephone and the availability of in person appointments. I also discussed with the patient that there may be a patient responsible charge related to this service. The patient expressed understanding and agreed to proceed.   History of Present Illness: Renee Waters presents oriented x5 (person, place, situation, time and object), alert, depressed, tearful, average height, thin, and cooperative to address mood. Patient has a history of medical treatment including hypertension and chronic pain. Patient has minimal history of mental health including outpatient therapy several years ago and medication management. Patient denies symptoms of mania. Patient denies suicidal and homicidal ideations. Patient denies psychosis including auditory and visual hallucinations. Patient denies substance use. Patient is at low risk for lethality at this time.    Observations/Objective: Physically: Patient is trying to manage her pain. She has not been sleeping as well. She is getting up when her husband gets up. Patient is exercising by walking around her property.  Spiritually/values: No issues identified.  Relationships: Patient is getting along with her family. She had a concern with her daughter and granddaughter but got it resolved. She addressed it with them. Patient is staying in contact with her family and friends.  Emotional/Mental/Behavior: Patient's mood has been anxious about going to her pain management appointment. Patient is worried about COVID19. After discussion, patient understood that she can take steps to prevent COVID19 including wearing gloves and a mask, as well as sanitizing.   Patient engaged in  session. She responded well to interventions. Patient continues to meet criteria for Major depressive disorder, recurrent episode, moderate with anxious distress. Patient will continue in outpatient therapy due to being the least restrictive service to meet her needs. Patient made moderate progress on her goals  Assessment and Plan: Therapist reviewed patient's recent thoughts and behaviors. Therapist utilized CBT to address mood. Therapist reviewed patient's goals. Therapist processed patient's feelings to identify triggers for mood. Therapist discussed patient's anxiety related to [place] and steps to manage her anxiety. .   Suicidal/Homicidal: Negativewithout intent/plan  Follow Up Instructions: Plan: Return again in 4 weeks.   I discussed the assessment and treatment plan with the patient. The patient was provided an opportunity to ask questions and all were answered. The patient agreed with the plan and demonstrated an understanding of the instructions.   The patient was advised to call back or seek an in-person evaluation if the symptoms worsen or if the condition fails to improve as anticipated.  I provided 40 minutes of non-face-to-face time during this encounter.   Glori Bickers, LCSW

## 2018-05-14 NOTE — Progress Notes (Signed)
cc'ed to pcp °

## 2018-05-14 NOTE — Patient Instructions (Signed)
Continue Protonix once each morning, 30 minutes before breakfast. As you have a history of Barrett's esophagus, it is important to take the reflux medication daily.  We will see you in the office July 2020!  Please call if further abdominal discomfort or issues with constipation!  It was a pleasure to talk with you today. I strive to create trusting relationships with patients to provide genuine, compassionate, and quality care. I value your feedback. If you receive a survey regarding your visit,  I greatly appreciate you taking time to fill this out.   Annitta Needs, PhD, ANP-BC [facility]

## 2018-05-15 DIAGNOSIS — M47816 Spondylosis without myelopathy or radiculopathy, lumbar region: Secondary | ICD-10-CM | POA: Diagnosis not present

## 2018-05-15 DIAGNOSIS — Z79891 Long term (current) use of opiate analgesic: Secondary | ICD-10-CM | POA: Diagnosis not present

## 2018-05-15 DIAGNOSIS — G894 Chronic pain syndrome: Secondary | ICD-10-CM | POA: Diagnosis not present

## 2018-05-15 DIAGNOSIS — M47812 Spondylosis without myelopathy or radiculopathy, cervical region: Secondary | ICD-10-CM | POA: Diagnosis not present
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
## 2018-05-17 ENCOUNTER — Other Ambulatory Visit: Payer: Self-pay

## 2018-05-17 ENCOUNTER — Encounter (HOSPITAL_COMMUNITY): Payer: Medicare Other | Admitting: Psychiatry

## 2018-05-18 ENCOUNTER — Ambulatory Visit (INDEPENDENT_AMBULATORY_CARE_PROVIDER_SITE_OTHER): Payer: Medicare Other | Admitting: Psychiatry

## 2018-05-18 ENCOUNTER — Encounter (HOSPITAL_COMMUNITY): Payer: Self-pay | Admitting: Psychiatry

## 2018-05-18 DIAGNOSIS — F331 Major depressive disorder, recurrent, moderate: Secondary | ICD-10-CM

## 2018-05-18 MED ORDER — DULOXETINE HCL 20 MG PO CPEP: 20.0000 mg | ORAL_CAPSULE | Freq: Every day | ORAL | 0 refills | Status: DC
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
## 2018-05-18 NOTE — Patient Instructions (Signed)
1. Start duloxetine 20 mg daily  2. Next appointment on 5/7 at 9:30 for 30 mins, video visit

## 2018-05-18 NOTE — Progress Notes (Signed)
This encounter was created in error - please disregard.
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
## 2018-06-09 ENCOUNTER — Other Ambulatory Visit: Payer: Self-pay | Admitting: Family Medicine

## 2018-06-13 DIAGNOSIS — G894 Chronic pain syndrome: Secondary | ICD-10-CM | POA: Diagnosis not present

## 2018-06-13 DIAGNOSIS — M47816 Spondylosis without myelopathy or radiculopathy, lumbar region: Secondary | ICD-10-CM | POA: Diagnosis not present

## 2018-06-13 DIAGNOSIS — M47812 Spondylosis without myelopathy or radiculopathy, cervical region: Secondary | ICD-10-CM | POA: Diagnosis not present

## 2018-06-13 DIAGNOSIS — Z79891 Long term (current) use of opiate analgesic: Secondary | ICD-10-CM | POA: Diagnosis not present

## 2018-06-14 ENCOUNTER — Other Ambulatory Visit: Payer: Self-pay

## 2018-06-14 ENCOUNTER — Encounter (HOSPITAL_COMMUNITY): Payer: Self-pay | Admitting: Psychiatry

## 2018-06-14 ENCOUNTER — Ambulatory Visit (INDEPENDENT_AMBULATORY_CARE_PROVIDER_SITE_OTHER): Payer: Medicare Other | Admitting: Psychiatry

## 2018-06-14 DIAGNOSIS — F331 Major depressive disorder, recurrent, moderate: Secondary | ICD-10-CM

## 2018-06-14 MED ORDER — QUETIAPINE FUMARATE 25 MG PO TABS: ORAL_TABLET | ORAL | 0 refills | Status: DC

## 2018-06-14 NOTE — Patient Instructions (Addendum)
1. Hold duloxetine 2. Start quetiapine 12.5 mg at night for one week, then 25 mg at night 3. Next appointment on 7/2 at 10:40

## 2018-06-19 ENCOUNTER — Ambulatory Visit (INDEPENDENT_AMBULATORY_CARE_PROVIDER_SITE_OTHER): Payer: Medicare Other | Admitting: Family Medicine

## 2018-06-19 VITALS — BP 112/73 | HR 75 | Ht 65.0 in | Wt 120.0 lb

## 2018-06-19 DIAGNOSIS — Z7189 Other specified counseling: Secondary | ICD-10-CM | POA: Diagnosis not present

## 2018-06-19 DIAGNOSIS — I10 Essential (primary) hypertension: Secondary | ICD-10-CM | POA: Diagnosis not present

## 2018-06-19 DIAGNOSIS — F5105 Insomnia due to other mental disorder: Secondary | ICD-10-CM

## 2018-06-19 DIAGNOSIS — F331 Major depressive disorder, recurrent, moderate: Secondary | ICD-10-CM | POA: Diagnosis not present

## 2018-06-19 DIAGNOSIS — R52 Pain, unspecified: Secondary | ICD-10-CM | POA: Diagnosis not present

## 2018-06-19 DIAGNOSIS — E559 Vitamin D deficiency, unspecified: Secondary | ICD-10-CM

## 2018-06-19 DIAGNOSIS — F418 Other specified anxiety disorders: Secondary | ICD-10-CM

## 2018-06-19 DIAGNOSIS — Z79899 Other long term (current) drug therapy: Secondary | ICD-10-CM | POA: Diagnosis not present

## 2018-06-19 NOTE — Patient Instructions (Addendum)
F/U in office in 5.5 months, please call if you need me sooner  Continue medications as you are taking them  Continue to practice caution to limit your exposure to COVID 19  Please get CBC, fasting lipid, cmp and eGFR, TSH and vit D level July 20  or after  Thanks for choosing [facility], we consider it a privelige to serve you.   Social distancing. Frequent hand washing with soap and water Keeping your hands off of your face., wearing a face mask outside of your home and keeping a 6 ft distance from people you do not live with. These practices will help to keep both you and your community healthy during this time. Please practice them faithfully!

## 2018-06-19 NOTE — Progress Notes (Signed)
Virtual Visit via Telephone Note  I connected with Renee Waters on 06/19/18 at  1:00 PM EDT by telephone and verified that I am speaking with the correct person using two identifiers.  Location: Patient: home Provider:office   I discussed the limitations, risks, security and privacy concerns of performing an evaluation and management service by telephone and the availability of in person appointments. I also discussed with the patient that there may be a patient responsible charge related to this service. The patient expressed understanding and agreed to proceed. This visit type is conducted due to national recommendations for restrictions regarding the COVID -19 Pandemic. Due to the patient's age and / or co morbidities, this format is felt to be most appropriate at this time without adequate follow up. The patient has no access to video technology/ had technical difficulties with video, requiring transitioning to audio format  only ( telephone ). All issues noted this document were discussed and addressed,no physical exam can be performed in this format.    History of Present Illness: C/o increased generalized pain rated at a 10 x 3 weeks, has been more active in her garden  Has appointment with Rheumatology in am, several months ago got prednisone pulse , for similar symptoms , that was effective, called in again for rept tho worse this time, an I appt is scheduled tomorrow Bowels move regularly with daily miralax, and uses smooth move /  Senna caps every 3 days if needed on day 3, has been working for past 5 months When bending toward the left she feels a knot under her rib cage for over 1 year, happens sometimes, now more often, on average 1 to 2 times per month, size is golf ball, and unchanged, pain is rated at 10 when this occurs, duration 3 mins approx, limits all activity , speech and breathing is p[ainful also Denies recent fever or chills. Denies sinus pressure, nasal congestion,  ear pain or sore throat. Denies chest congestion, productive cough or wheezing. Denies chest pains, palpitations and leg swelling Denies abdominal pain, nausea, vomiting,diarrhea or constipation.   Denies dysuria, frequency, hesitancy or incontinence. Increased  joint pain, swelling and limitation in mobility. Denies headaches, seizures, numbness, or tingling. Denies uncontrolled  depression, anxiety or insomnia.Managed by psych with therapy and medication Denies skin break down or rash.       Observations/Objective: BP 112/73   Pulse 75   Ht 5\' 5"  (1.651 m)   Wt 120 lb (54.4 kg)   BMI 19.97 kg/m  Good communication with no confusion and intact memory. Alert and oriented x 3 No signs of respiratory distress during sppech    Assessment and Plan: Essential hypertension Controlled, no change in medication DASH diet and commitment to daily physical activity for a minimum of 30 minutes discussed and encouraged, as a part of hypertension management. The importance of attaining a healthy weight is also discussed.  BP/Weight 06/19/2018 01/18/2018 01/09/2018 11/29/2017 11/09/2017 09/21/2017 07/21/4313  Systolic BP 400 867 619 509 326 712 458  Diastolic BP 73 50 70 81 80 67 70  Wt. (Lbs) 120 120.12 120.12 119 114 116.8 117.04  BMI 19.97 19.99 19.99 19.8 18.97 19.44 19.48  Some encounter information is confidential and restricted. Go to Review Flowsheets activity to see all data.       Insomnia secondary to depression with anxiety Sleep hygiene reviewed and written information offered also. Prescription sent for  medication needed. Controlled on current regime  MDD (major depressive disorder), recurrent episode,  moderate ([place]) Being treated by Psychiatry , denies uncontrolled symptoms  Generalized pain Treated by pain mangement and adequately controlled except in poast several weeks, has responded in the past to steroid pulse , has upcoming appt with Rheumatology  Educated  About Covid-19 Virus Infection Covid-19 Education  The signs and symptoms of of COVID -19 were discussed with the patient and how to seek care for testing. ( follow up with PCP or arrange  E-visit) The importance of social  distancing is discussed today.     Follow Up Instructions:    I discussed the assessment and treatment plan with the patient. The patient was provided an opportunity to ask questions and all were answered. The patient agreed with the plan and demonstrated an understanding of the instructions.   The patient was advised to call back or seek an in-person evaluation if the symptoms worsen or if the condition fails to improve as anticipated.  I provided 22 minutes of non-face-to-face time during this encounter.   Tula Nakayama, MD

## 2018-06-21 DIAGNOSIS — M791 Myalgia, unspecified site: Secondary | ICD-10-CM | POA: Diagnosis not present

## 2018-06-21 DIAGNOSIS — R768 Other specified abnormal immunological findings in serum: Secondary | ICD-10-CM | POA: Diagnosis not present

## 2018-06-21 DIAGNOSIS — M150 Primary generalized (osteo)arthritis: Secondary | ICD-10-CM | POA: Diagnosis not present

## 2018-06-21 DIAGNOSIS — M545 Low back pain: Secondary | ICD-10-CM | POA: Diagnosis not present

## 2018-06-21 DIAGNOSIS — M2550 Pain in unspecified joint: Secondary | ICD-10-CM | POA: Diagnosis not present

## 2018-06-24 ENCOUNTER — Encounter: Payer: Self-pay | Admitting: Family Medicine

## 2018-06-24 DIAGNOSIS — Z7189 Other specified counseling: Secondary | ICD-10-CM | POA: Insufficient documentation

## 2018-06-24 MED ORDER — TEMAZEPAM 30 MG PO CAPS: 30.0000 mg | ORAL_CAPSULE | Freq: Every day | ORAL | 5 refills | Status: DC

## 2018-06-24 NOTE — Assessment & Plan Note (Signed)
Being treated by Psychiatry , denies uncontrolled symptoms

## 2018-06-24 NOTE — Assessment & Plan Note (Addendum)
Treated by pain mangement and adequately controlled except in poast several weeks, has responded in the past to steroid pulse , has upcoming appt with Rheumatology

## 2018-06-24 NOTE — Assessment & Plan Note (Signed)
Covid-19 Education  The signs and symptoms of of COVID -19 were discussed with the patient and how to seek care for testing. ( follow up with PCP or arrange  E-visit) The importance of social  distancing is discussed today.  

## 2018-06-24 NOTE — Assessment & Plan Note (Signed)
Sleep hygiene reviewed and written information offered also. Prescription sent for  medication needed. Controlled on current regime 

## 2018-06-24 NOTE — Assessment & Plan Note (Signed)
Controlled, no change in medication DASH diet and commitment to daily physical activity for a minimum of 30 minutes discussed and encouraged, as a part of hypertension management. The importance of attaining a healthy weight is also discussed.  BP/Weight 06/19/2018 01/18/2018 01/09/2018 11/29/2017 11/09/2017 09/21/2017 1/61/0960  Systolic BP 454 098 119 147 829 562 130  Diastolic BP 73 50 70 81 80 67 70  Wt. (Lbs) 120 120.12 120.12 119 114 116.8 117.04  BMI 19.97 19.99 19.99 19.8 18.97 19.44 19.48  Some encounter information is confidential and restricted. Go to Review Flowsheets activity to see all data.

## 2018-07-16 ENCOUNTER — Encounter (HOSPITAL_COMMUNITY): Payer: Self-pay | Admitting: Licensed Clinical Social Worker

## 2018-07-16 ENCOUNTER — Ambulatory Visit (INDEPENDENT_AMBULATORY_CARE_PROVIDER_SITE_OTHER): Payer: Medicare Other | Admitting: Licensed Clinical Social Worker

## 2018-07-16 ENCOUNTER — Other Ambulatory Visit: Payer: Self-pay

## 2018-07-16 DIAGNOSIS — F331 Major depressive disorder, recurrent, moderate: Secondary | ICD-10-CM | POA: Diagnosis not present

## 2018-07-16 NOTE — Progress Notes (Signed)
Virtual Visit via Telephone Note  I connected with Renee Waters on 07/16/18 at 10:00 AM EDT by telephone and verified that I am speaking with the correct person using two identifiers.   I discussed the limitations, risks, security and privacy concerns of performing an evaluation and management service by telephone and the availability of in person appointments. I also discussed with the patient that there may be a patient responsible charge related to this service. The patient expressed understanding and agreed to proceed.   History of Present Illness: Renee Waters presents oriented x5 (person, place, situation, time and object), alert, depressed, tearful, average height, thin, and cooperative to address mood. Patient has a history of medical treatment including hypertension and chronic pain. Patient has minimal history of mental health including outpatient therapy several years ago and medication management. Patient denies symptoms of mania. Patient denies suicidal and homicidal ideations. Patient denies psychosis including auditory and visual hallucinations. Patient denies substance use. Patient is at low risk for lethality at this time.    Observations/Objective: Physically: Patient was unable to get her pain medicine due to the doctor not getting it in at the appropriate time. She is trying to cope with her pain.  Spiritually/values: No issues identified.  Relationships: Patient is getting along with her family. Her grandchild is graduating highschool and patient is upset that they couldn't do a graduation for them.  Emotional/Mental/Behavior: Patient's mood has been anxious and depressed. Patient is overwhelmed with the [place] and the protests. Patient feels like things will never be over with the quarantine. Patient understood that she needs to take things day by day.   Patient engaged in session. She responded well to interventions. Patient continues to meet criteria for Major depressive disorder,  recurrent episode, moderate with anxious distress. Patient will continue in outpatient therapy due to being the least restrictive service to meet her needs. Patient made moderate progress on her goals  Assessment and Plan: Therapist reviewed patient's recent thoughts and behaviors. Therapist utilized CBT to address mood. Therapist reviewed patient's goals. Therapist processed patient's feelings to identify triggers for mood. Therapist discussed patient's anxiety and depression related quarantine.    Suicidal/Homicidal: Negativewithout intent/plan  Follow Up Instructions: Plan: Return again in 4 weeks.   I discussed the assessment and treatment plan with the patient. The patient was provided an opportunity to ask questions and all were answered. The patient agreed with the plan and demonstrated an understanding of the instructions.   The patient was advised to call back or seek an in-person evaluation if the symptoms worsen or if the condition fails to improve as anticipated.  I provided 40 minutes of non-face-to-face time during this encounter.   Glori Bickers, LCSW

## 2018-07-30 ENCOUNTER — Other Ambulatory Visit: Payer: Self-pay

## 2018-07-30 ENCOUNTER — Encounter (HOSPITAL_COMMUNITY): Payer: Self-pay | Admitting: Licensed Clinical Social Worker

## 2018-07-30 ENCOUNTER — Ambulatory Visit (INDEPENDENT_AMBULATORY_CARE_PROVIDER_SITE_OTHER): Payer: Medicare Other | Admitting: Licensed Clinical Social Worker

## 2018-07-30 DIAGNOSIS — F331 Major depressive disorder, recurrent, moderate: Secondary | ICD-10-CM | POA: Diagnosis not present

## 2018-07-30 NOTE — Progress Notes (Signed)
Virtual Visit via Telephone Note  I connected with Renee Waters on 07/30/18 at 11:00 AM EDT by telephone and verified that I am speaking with the correct person using two identifiers.   I discussed the limitations, risks, security and privacy concerns of performing an evaluation and management service by telephone and the availability of in person appointments. I also discussed with the patient that there may be a patient responsible charge related to this service. The patient expressed understanding and agreed to proceed.   History of Present Illness: Renee Waters presents oriented x5 (person, place, situation, time and object), alert, depressed, tearful, average height, thin, and cooperative to address mood. Patient has a history of medical treatment including hypertension and chronic pain. Patient has minimal history of mental health including outpatient therapy several years ago and medication management. Patient denies symptoms of mania. Patient denies suicidal and homicidal ideations. Patient denies psychosis including auditory and visual hallucinations. Patient denies substance use. Patient is at low risk for lethality at this time.    Observations/Objective: Physically: Patient has fallen and bruised her hip. Patient is worried about falling in the future. Patient's husband has fallen as well. Patient doesn't feel like doing much due to pain and being tired.   Spiritually/values: No issues identified.  Relationships: Patient is getting along with her family. Her children came by her home for Father's Day and brought gifts for her husband. Patient is tired of communicating with people though video or phone. She doesn't feel connected to others right now and is craving just being able to hug her family.  Emotional/Mental/Behavior: Patient's mood has been depressed. She is tired of quarantine. Patient feels stuck and doesn't feel connected.    Patient engaged in session. She responded well to  interventions. Patient continues to meet criteria for Major depressive disorder, recurrent episode, moderate with anxious distress. Patient will continue in outpatient therapy due to being the least restrictive service to meet her needs. Patient made moderate progress on her goals  Assessment and Plan: Therapist reviewed patient's recent thoughts and behaviors. Therapist utilized CBT to address mood. Therapist reviewed patient's goals. Therapist processed patient's feelings to identify triggers for mood. Therapist discussed patient's depression related quarantine.    Suicidal/Homicidal: Negativewithout intent/plan  Follow Up Instructions: Plan: Return again in 4 weeks.   I discussed the assessment and treatment plan with the patient. The patient was provided an opportunity to ask questions and all were answered. The patient agreed with the plan and demonstrated an understanding of the instructions.   The patient was advised to call back or seek an in-person evaluation if the symptoms worsen or if the condition fails to improve as anticipated.  I provided 40 minutes of non-face-to-face time during this encounter.   Glori Bickers, LCSW
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
## 2018-08-08 DIAGNOSIS — Z79891 Long term (current) use of opiate analgesic: Secondary | ICD-10-CM | POA: Diagnosis not present

## 2018-08-08 DIAGNOSIS — G894 Chronic pain syndrome: Secondary | ICD-10-CM | POA: Diagnosis not present

## 2018-08-08 DIAGNOSIS — M47816 Spondylosis without myelopathy or radiculopathy, lumbar region: Secondary | ICD-10-CM | POA: Diagnosis not present

## 2018-08-08 DIAGNOSIS — M47812 Spondylosis without myelopathy or radiculopathy, cervical region: Secondary | ICD-10-CM | POA: Diagnosis not present

## 2018-08-09 ENCOUNTER — Encounter (HOSPITAL_COMMUNITY): Payer: Self-pay | Admitting: Psychiatry

## 2018-08-09 ENCOUNTER — Other Ambulatory Visit: Payer: Self-pay

## 2018-08-09 ENCOUNTER — Ambulatory Visit (INDEPENDENT_AMBULATORY_CARE_PROVIDER_SITE_OTHER): Payer: Medicare Other | Admitting: Psychiatry

## 2018-08-09 DIAGNOSIS — F3341 Major depressive disorder, recurrent, in partial remission: Secondary | ICD-10-CM

## 2018-08-09 MED ORDER — DULOXETINE HCL 20 MG PO CPEP: 20.0000 mg | ORAL_CAPSULE | Freq: Every day | ORAL | 0 refills | Status: DC

## 2018-08-09 NOTE — Patient Instructions (Signed)
1. Continue duloxetine 20 mg daily 2. Next appointment on 9/22 at 11 AM

## 2018-08-21 ENCOUNTER — Ambulatory Visit (INDEPENDENT_AMBULATORY_CARE_PROVIDER_SITE_OTHER): Payer: Medicare Other | Admitting: Gastroenterology

## 2018-08-21 ENCOUNTER — Encounter: Payer: Self-pay | Admitting: Gastroenterology

## 2018-08-21 ENCOUNTER — Ambulatory Visit: Payer: Medicare Other | Admitting: Gastroenterology

## 2018-08-21 ENCOUNTER — Other Ambulatory Visit: Payer: Self-pay

## 2018-08-21 DIAGNOSIS — K5903 Drug induced constipation: Secondary | ICD-10-CM | POA: Diagnosis not present

## 2018-08-21 DIAGNOSIS — K219 Gastro-esophageal reflux disease without esophagitis: Secondary | ICD-10-CM | POA: Diagnosis not present

## 2018-08-21 DIAGNOSIS — K2270 Barrett's esophagus without dysplasia: Secondary | ICD-10-CM

## 2018-08-21 NOTE — Patient Instructions (Signed)
1. Continue pantoprazole once daily for acid reflux/Barrett's esophagus.  2. Call if your upper abdominal pain becomes more frequent and you want to pursue work up.  3. We will have you come back in two months, and hopefully scheduling upper endoscopy at that time if you are ready! 4. Please call if you have any questions or concerns in the meantime.

## 2018-08-21 NOTE — Progress Notes (Addendum)
Primary Care Physician:  [name], MD Primary GI:  Garfield Cornea, MD   Patient Location: Home  Provider Location: Provider's home  Reason for Visit: gerd, Barrett's, constipation  Persons present on the virtual encounter, with roles: Patient, myself (provider),Angela Stallings CMA (updated meds and allergies)  Total time (minutes) spent on medical discussion: 15 minutes  Due to COVID-19, visit was conducted using [email or web address] method.  Visit was requested by patient.  Virtual Visit via [email or web address]  I connected with Renee Waters on 08/21/18 at 11:30 AM EDT by [email or web address] and verified that I am speaking with the correct person using two identifiers.   I discussed the limitations, risks, security and privacy concerns of performing an evaluation and management service by telephone/video and the availability of in person appointments. I also discussed with the patient that there may be a patient responsible charge related to this service. The patient expressed understanding and agreed to proceed.  Chief Complaint  Patient presents with  . Follow-up    Barrett's esophagus,meds working great     HPI:   Renee Waters is a 73 y.o. female who presents for virtual visit regarding for follow up. She was last seen in 05/2018. She has h/o Barrett's esophagus, opioid-induced constipation. Was feeling a knot-type feeling in upper abdomen with bending over during last ov.   Due for EGD for Barrett's surveillance. Last EGD 09/2015. 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 Next TCS 09/2020.   Overall doing well. Reflux well controlled. Taking pantoprazole daily. BMs regular with miralax and senna. No melena, brbpr. No dysphagia. No vomiting. Continues to have a "knot" come up under Left ribs in LUQ. Feels a bulging when it happens. Occurring once every 2 weeks. Brought on by stretching/movement. Not related to meals or BMs. Lasts for 15-20 seconds at a time.      Current Outpatient Medications  Medication Sig Dispense Refill  . acyclovir (ZOVIRAX) 800 MG tablet TAKE 1 TABLET BY MOUTH 4 TIMES DAILY AS NEEDED FOR  BREAKOUTS 30 tablet 1  . ALPRAZolam (XANAX) 0.5 MG tablet TAKE 1 TABLET BY MOUTH AT BEDTIME 30 tablet 0  . DULoxetine (CYMBALTA) 20 MG capsule Take 1 capsule (20 mg total) by mouth daily. 90 capsule 0  . EPINEPHRINE 0.3 mg/0.3 mL IJ SOAJ injection INJECT 0.3 MLS INTO MUSCLE ONCE AS NEEDED 1 Device 2  . gabapentin (NEURONTIN) 300 MG capsule TAKE ONE CAPSULE BY MOUTH ONCE DAILY FOR  CHRONIC  PAIN  (DOSE  REDUCTION) 90 capsule 0  . hydrochlorothiazide (HYDRODIURIL) 25 MG tablet Take 1 tablet (25 mg total) by mouth daily. 90 tablet 3  . meloxicam (MOBIC) 7.5 MG tablet TAKE 1 TABLET BY MOUTH ONCE DAILY 30 tablet 5  . Multiple Vitamins-Minerals (MULTIVITAMINS THER. W/MINERALS) TABS Take 1 tablet by mouth daily.     . Oxycodone HCl 10 MG TABS Take 1 tablet (10 mg total) by mouth 4 (four) times daily. (Patient taking differently: Take 10 mg by mouth every 6 (six) hours as needed (pain). ) 56 tablet 0  . pantoprazole (PROTONIX) 40 MG tablet TAKE 1 TABLET BY MOUTH ONCE DAILY BEFORE BREAKFAST 90 tablet 0  . senna (SENOKOT) 8.6 MG TABS tablet Take 2 tablets by mouth daily as needed for mild constipation.    . temazepam (RESTORIL) 30 MG capsule Take 1 capsule (30 mg total) by mouth at bedtime. 30 capsule 5  . tiZANidine (ZANAFLEX) 4 MG tablet One tablet twice daily (Patient  taking differently: Take 6.5 mg by mouth 2 (two) times daily as needed for muscle spasms. ) 60 tablet 5   No current facility-administered medications for this visit.     Past Medical History:  Diagnosis Date  . Acute cholangitis   . Allergy   . Anemia   . Anxiety   . Barrett's esophagus   . Cataract   . Chronic back pain   . Chronic neck pain   . Depression   . Genital herpes   . GERD (gastroesophageal reflux disease)   . History of blood transfusion   . Hypertension   .  Insomnia   . Lupus (systemic lupus erythematosus) ([place])   . Neuromuscular disorder ([place])   . Osteoarthritis   . S/P colonoscopy June 2005   normal, no polyps  . S/P endoscopy June 2005, Oct 2009   2005: short-segment Barrett's, 2009: short-segment Barrett's  . UTI (lower urinary tract infection) 11/2012    Past Surgical History:  Procedure Laterality Date  . ABDOMINAL HYSTERECTOMY    . BACK SURGERY    . BIOPSY N/A 03/20/2014   Procedure: BIOPSY;  Surgeon: Daneil Dolin, MD;  Location: [facility];  Service: Endoscopy;  Laterality: N/A;  . BIOPSY  09/14/2015   Procedure: BIOPSY;  Surgeon: Daneil Dolin, MD;  Location: [facility];  Service: Endoscopy;;  esophageal and gastric  . CARPAL TUNNEL RELEASE Left 2013  . cervical disectomy  2002  . CHOLECYSTECTOMY     with lysis of adhesions for sbo; "ruptured gallbladder".  . COLONOSCOPY  11/09/2011   RMR: Melanosis coli  . COLONOSCOPY WITH PROPOFOL N/A 09/14/2015   Dr. Gala Romney: diverticulosis, 51mm TA removed. next TCS 09/2020.   . DENTAL SURGERY  11/2015   multiple tooth extraction  . ESOPHAGOGASTRODUODENOSCOPY  11/29/2007   salmon-colored  tongue   longest stable at  3 cm, distal esophagus as described previously status post biopsy/ Hiatal hernia, otherwise normal stomach D1 and D2  . ESOPHAGOGASTRODUODENOSCOPY  01/06/11   short segment Barrett's esophagus s/p bx/Hiatal hernia  . ESOPHAGOGASTRODUODENOSCOPY (EGD) WITH PROPOFOL N/A 03/20/2014   DJS:HFWYOVZC distal esophagus short segment barrett's, bx with no dysplasia. next egd in 03/2017  . ESOPHAGOGASTRODUODENOSCOPY (EGD) WITH PROPOFOL N/A 09/14/2015   Dr. Gala Romney: Barrett's without dysplasia, gastritis benign bx, hiatal hernia. next EGD 09/2018.  [facility] HERNIA REPAIR Right 07/2010   Dr. Zada Girt  . JOINT REPLACEMENT    . LAPAROSCOPIC CHOLECYSTECTOMY  2017   at [facility]  . POLYPECTOMY  09/14/2015   Procedure: POLYPECTOMY;  Surgeon: Daneil Dolin, MD;  Location: [facility];  Service: Endoscopy;;   ascending colon  . right hip replacement  07/2010   went back in sept 2012 to fix  . SHOULDER ARTHROSCOPY  2008   left  . TOTAL HIP REVISION Right 12/17/2012   Procedure: RIGHT TOTAL HIP REVISION;  Surgeon: Mauri Pole, MD;  Location: [facility];  Service: Orthopedics;  Laterality: Right;  . WRIST SURGERY Right 2011   open reduction right wrist.    Family History  Problem Relation Age of Onset  . Hypertension Mother   . Stroke Mother   . Colon cancer Neg Hx   . Anesthesia problems Neg Hx   . Hypotension Neg Hx   . Malignant hyperthermia Neg Hx   . Pseudochol deficiency Neg Hx   . Gastric cancer Neg Hx   . Esophageal cancer Neg Hx     Social History   Socioeconomic History  . Marital  status: Married    Spouse name: louis  . Number of children: 4  . Years of education: 12+  . Highest education level: Some college, no degree  Occupational History  . Occupation: Press photographer  - retired  . Occupation: non Multimedia programmer  . Financial resource strain: Not hard at all  . Food insecurity    Worry: Never true    Inability: Never true  . Transportation needs    Medical: No    Non-medical: No  Tobacco Use  . Smoking status: Former Smoker    Packs/day: 0.25    Years: 25.00    Pack years: 6.25    Types: Cigarettes    Quit date: 02/07/2003    Years since quitting: 15.5  . Smokeless tobacco: Never Used  . Tobacco comment: quit in 2004  Substance and Sexual Activity  . Alcohol use: No  . Drug use: No  . Sexual activity: Yes    Birth control/protection: Surgical  Lifestyle  . Physical activity    Days per week: 2 days    Minutes per session: 40 min  . Stress: To some extent  Relationships  . Social connections    Talks on phone: More than three times a week    Gets together: Once a week    Attends religious service: Never    Active member of club or organization: No    Attends meetings of clubs or organizations: Never    Relationship status: Married  . Intimate  partner violence    Fear of current or ex partner: No    Emotionally abused: No    Physically abused: No    Forced sexual activity: No  Other Topics Concern  . Not on file  Social History Narrative  . Not on file      ROS:  General: Negative for anorexia, weight loss, fever, chills, fatigue, weakness. Eyes: Negative for vision changes.  ENT: Negative for hoarseness, difficulty swallowing , nasal congestion. CV: Negative for chest pain, angina, palpitations, dyspnea on exertion, peripheral edema.  Respiratory: Negative for dyspnea at rest, dyspnea on exertion, cough, sputum, wheezing.  GI: See history of present illness. GU:  Negative for dysuria, hematuria, urinary incontinence, urinary frequency, nocturnal urination.  MS: Negative for joint pain, low back pain.  Derm: Negative for rash or itching.  Neuro: Negative for weakness, abnormal sensation, seizure, frequent headaches, memory loss, confusion.  Psych: Negative for anxiety, depression, suicidal ideation, hallucinations.  Endo: Negative for unusual weight change.  Heme: Negative for bruising or bleeding. Allergy: Negative for rash or hives.   Observations/Objective: Pleasant, Well nourished female in NAD. Breathing unlabored. Positive affect. Conjunctivae clear, no scleral icterus. She points to LUQ below left rib at location of pain/bulge when it occurs. Not present currently.   Assessment and Plan: Pleasant 73 y/o female with GERD/Barrett's, opioid induced constipation who presents for follow up. She wants to postpone EGD for couple of months due to Covid. She will continue PPI daily. OIC controlled with current bowel regimen. LUQ pain/bulge occurs infrequent and short lived. Likely musculoskeletal. We discussed repeat imaging, likely will be low yield given frequency of symptoms. Doubt we are dealing with abdominal wall hernia in this location. She has h/o internal hernia last year with bowel obstruction but follow up CTE was  normal.   For now patient wants to observe symptoms, will consider repeat imaging if symptoms become more frequent.   Will have her come to office in 8 weeks and consider  scheduled EGD for Barrett's surveillance if she is agreeable at that time.   Follow Up Instructions:    I discussed the assessment and treatment plan with the patient. The patient was provided an opportunity to ask questions and all were answered. The patient agreed with the plan and demonstrated an understanding of the instructions. AVS mailed to patient's home address.   The patient was advised to call back or seek an in-person evaluation if the symptoms worsen or if the condition fails to improve as anticipated.  I provided 15 minutes of virtual face-to-face time during this encounter.   Neil Crouch, PA-C

## 2018-08-22 NOTE — Progress Notes (Signed)
CC'D TO PCP °

## 2018-08-28 ENCOUNTER — Other Ambulatory Visit: Payer: Self-pay | Admitting: Family Medicine

## 2018-08-28 ENCOUNTER — Encounter (HOSPITAL_COMMUNITY): Payer: Self-pay | Admitting: Licensed Clinical Social Worker

## 2018-08-28 ENCOUNTER — Other Ambulatory Visit: Payer: Self-pay

## 2018-08-28 ENCOUNTER — Ambulatory Visit (INDEPENDENT_AMBULATORY_CARE_PROVIDER_SITE_OTHER): Payer: Medicare Other | Admitting: Licensed Clinical Social Worker

## 2018-08-28 DIAGNOSIS — F3341 Major depressive disorder, recurrent, in partial remission: Secondary | ICD-10-CM

## 2018-08-28 NOTE — Progress Notes (Signed)
Virtual Visit via Video Note  I connected with Renee Waters on 08/28/18 at 11:00 AM EDT by video and verified that I am speaking with the correct person using two identifiers.   I discussed the limitations, risks, security and privacy concerns of performing an evaluation and management service by telephone and the availability of in person appointments. I also discussed with the patient that there may be a patient responsible charge related to this service. The patient expressed understanding and agreed to proceed.   History of Present Illness: Renee Waters presents oriented x5 (person, place, situation, time and object), alert, depressed, tearful, average height, thin, and cooperative to address mood. Patient has a history of medical treatment including hypertension and chronic pain. Patient has minimal history of mental health including outpatient therapy several years ago and medication management. Patient denies symptoms of mania. Patient denies suicidal and homicidal ideations. Patient denies psychosis including auditory and visual hallucinations. Patient denies substance use. Patient is at low risk for lethality at this time.    Observations/Objective: Physically: Patient is doing well physically. She was a little sore due to overexertion.    Spiritually/values: No issues identified.  Relationships: Patient is getting along with her family. She has been spending time with family and connecting with family.  Emotional/Mental/Behavior: Patient's mood has been good. She denies anxiety. She denies depression. Patient has been staying busy and interacting with family.    Patient engaged in session. She responded well to interventions. Patient continues to meet criteria for Major depressive disorder, recurrent episode, moderate with anxious distress. Patient will continue in outpatient therapy due to being the least restrictive service to meet her needs. Patient made moderate progress on her  goals  Assessment and Plan: Therapist reviewed patient's recent thoughts and behaviors. Therapist utilized CBT to address mood. Therapist reviewed patient's goals. Therapist processed patient's feelings to identify triggers for mood. Therapist discussed patient's interaction with family and improved mood.   Suicidal/Homicidal: Negativewithout intent/plan  Follow Up Instructions: Plan: Return again in 4 weeks.   I discussed the assessment and treatment plan with the patient. The patient was provided an opportunity to ask questions and all were answered. The patient agreed with the plan and demonstrated an understanding of the instructions.   The patient was advised to call back or seek an in-person evaluation if the symptoms worsen or if the condition fails to improve as anticipated.  I provided 30 minutes of non-face-to-face time during this encounter.   Glori Bickers, LCSW

## 2018-08-31 ENCOUNTER — Other Ambulatory Visit: Payer: Self-pay | Admitting: Family Medicine

## 2018-09-03 ENCOUNTER — Other Ambulatory Visit: Payer: Self-pay | Admitting: Family Medicine

## 2018-09-03 NOTE — Progress Notes (Signed)
Xanax will be refilled

## 2018-09-05 ENCOUNTER — Telehealth: Payer: Self-pay | Admitting: Family Medicine

## 2018-09-05 ENCOUNTER — Other Ambulatory Visit: Payer: Self-pay | Admitting: Family Medicine

## 2018-09-05 NOTE — Telephone Encounter (Signed)
pls verify with pt she has the xanax which I already refilled this week, refill request has come back again

## 2018-09-06 ENCOUNTER — Other Ambulatory Visit: Payer: Self-pay | Admitting: Family Medicine

## 2018-09-06 MED ORDER — TEMAZEPAM 30 MG PO CAPS: 30.0000 mg | ORAL_CAPSULE | Freq: Every evening | ORAL | 5 refills | Status: DC | PRN

## 2018-09-06 MED ORDER — ALPRAZOLAM 0.5 MG PO TABS: 0.5000 mg | ORAL_TABLET | Freq: Every evening | ORAL | 5 refills | Status: DC | PRN

## 2018-09-06 NOTE — Telephone Encounter (Signed)
You are correct but it had no refills and she is not returning until 4 months. Guess the pharmacy sends out automatic refill requests on ones with zero remaining.

## 2018-09-06 NOTE — Telephone Encounter (Signed)
Thanks, taken care of, I should have seen that , again, thanks

## 2018-10-03 DIAGNOSIS — M47816 Spondylosis without myelopathy or radiculopathy, lumbar region: Secondary | ICD-10-CM | POA: Diagnosis not present

## 2018-10-03 DIAGNOSIS — M47812 Spondylosis without myelopathy or radiculopathy, cervical region: Secondary | ICD-10-CM | POA: Diagnosis not present

## 2018-10-03 DIAGNOSIS — G894 Chronic pain syndrome: Secondary | ICD-10-CM | POA: Diagnosis not present

## 2018-10-03 DIAGNOSIS — Z79891 Long term (current) use of opiate analgesic: Secondary | ICD-10-CM | POA: Diagnosis not present
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
## 2018-10-30 ENCOUNTER — Ambulatory Visit: Payer: Medicare Other | Admitting: Gastroenterology

## 2018-10-30 ENCOUNTER — Encounter (HOSPITAL_COMMUNITY): Payer: Self-pay | Admitting: Psychiatry

## 2018-10-30 ENCOUNTER — Ambulatory Visit (INDEPENDENT_AMBULATORY_CARE_PROVIDER_SITE_OTHER): Payer: Medicare Other | Admitting: Psychiatry

## 2018-10-30 ENCOUNTER — Other Ambulatory Visit: Payer: Self-pay

## 2018-10-30 DIAGNOSIS — F330 Major depressive disorder, recurrent, mild: Secondary | ICD-10-CM | POA: Diagnosis not present

## 2018-10-30 MED ORDER — DULOXETINE HCL 20 MG PO CPEP: 20.0000 mg | ORAL_CAPSULE | Freq: Every day | ORAL | 0 refills | Status: DC

## 2018-10-30 NOTE — Patient Instructions (Signed)
1. Continue duloxetine 20 mg daily 2. Next appointment on 10/27 at 11 AM

## 2018-11-13 ENCOUNTER — Encounter: Payer: Self-pay | Admitting: Internal Medicine

## 2018-11-13 ENCOUNTER — Ambulatory Visit (INDEPENDENT_AMBULATORY_CARE_PROVIDER_SITE_OTHER): Payer: Medicare Other | Admitting: Gastroenterology

## 2018-11-13 ENCOUNTER — Encounter: Payer: Self-pay | Admitting: Gastroenterology

## 2018-11-13 ENCOUNTER — Other Ambulatory Visit: Payer: Self-pay | Admitting: Family Medicine

## 2018-11-13 ENCOUNTER — Other Ambulatory Visit: Payer: Self-pay

## 2018-11-13 DIAGNOSIS — K2270 Barrett's esophagus without dysplasia: Secondary | ICD-10-CM | POA: Diagnosis not present

## 2018-11-13 DIAGNOSIS — K2100 Gastro-esophageal reflux disease with esophagitis, without bleeding: Secondary | ICD-10-CM | POA: Diagnosis not present

## 2018-11-13 MED ORDER — PANTOPRAZOLE SODIUM 40 MG PO TBEC: DELAYED_RELEASE_TABLET | ORAL | 3 refills | Status: DC

## 2018-11-13 NOTE — Patient Instructions (Signed)
1. Continue pantoprazole 40 mg daily before breakfast.  Refill sent to your pharmacy. 2. We will plan to see you back in 6 months however if you decide to proceed with upper endoscopy for Barrett's surveillance before that, please give [place] a call and we will make arrangements.

## 2018-11-13 NOTE — Progress Notes (Signed)
Primary Care Physician:  [name], MD Primary GI:  Garfield Cornea, MD   Patient Location: Home  Provider Location: [facility] office  Reason for Phone Visit: GERD/Barrett's  Persons present on the phone encounter, with roles: Patient, myself (provider),Martina Darrick Grinder LPN(updated meds and allergies)  Total time (minutes) spent on medical discussion: 7 minutes  Due to COVID-19, visit was conducted using telephonic method (no video was available).  Attempted Doxy but cell phone would not work. Visit was requested by patient.  Virtual Visit via Telephone only  I connected with Mrs. Renee Waters on 11/13/18 at 10:30 AM EDT by telephone and verified that I am speaking with the correct person using two identifiers.   I discussed the limitations, risks, security and privacy concerns of performing an evaluation and management service by telephone and the availability of in person appointments. I also discussed with the patient that there may be a patient responsible charge related to this service. The patient expressed understanding and agreed to proceed.   HPI:   Patient is a pleasant 73 who presents for telephone visit regarding GERD, Barrett's esophagus.  Last seen via virtual visit August 21, 2018.  She has a history of Barrett's esophagus, opioid induced constipation.  She is due for Barrett's surveillance via EGD at this time.  Her next colonoscopy is due in August 2022 for history of tubular adenomas.  She postponed EGD at last visit due to the [place] pandemic.  She has been on pantoprazole 40 mg daily for acid reflux/Barrett's.  Since I last saw her she had a MR abdomen/MRCP on July 14 for abdominal pain.  Given limitations of multifactorial degradation including motion and lack of parenchymal opacification of postcontrast images, there was no acute process or explanation for abdominal pain.  Patient wants to hold off on EGD for now because of pandemic.  She states her reflux is well  controlled on pantoprazole.  No dysphagia.  No vomiting.  No abdominal pain. BM regular. No melena, brbpr.     Current Outpatient Medications  Medication Sig Dispense Refill  . acyclovir (ZOVIRAX) 800 MG tablet TAKE 1 TABLET BY MOUTH 4 TIMES DAILY AS NEEDED FOR  BREAKOUTS 30 tablet 1  . ALPRAZolam (XANAX) 0.5 MG tablet TAKE 1 TABLET BY MOUTH AT BEDTIME 30 tablet 0  . ALPRAZolam (XANAX) 0.5 MG tablet Take 1 tablet (0.5 mg total) by mouth at bedtime as needed for anxiety. 30 tablet 5  . DULoxetine (CYMBALTA) 20 MG capsule Take 1 capsule (20 mg total) by mouth daily. 90 capsule 0  . EPINEPHRINE 0.3 mg/0.3 mL IJ SOAJ injection INJECT 0.3 MLS INTO MUSCLE ONCE AS NEEDED 1 Device 2  . gabapentin (NEURONTIN) 300 MG capsule TAKE ONE CAPSULE BY MOUTH ONCE DAILY FOR  CHRONIC  PAIN  (DOSE  REDUCTION) 90 capsule 0  . hydrochlorothiazide (HYDRODIURIL) 25 MG tablet Take 1 tablet (25 mg total) by mouth daily. 90 tablet 3  . meloxicam (MOBIC) 7.5 MG tablet TAKE 1 TABLET BY MOUTH ONCE DAILY (Patient taking differently: as needed. ) 30 tablet 5  . Multiple Vitamins-Minerals (MULTIVITAMINS THER. W/MINERALS) TABS Take 1 tablet by mouth daily.     . Oxycodone HCl 10 MG TABS Take 1 tablet (10 mg total) by mouth 4 (four) times daily. (Patient taking differently: Take 10 mg by mouth every 6 (six) hours as needed (pain). ) 56 tablet 0  . pantoprazole (PROTONIX) 40 MG tablet TAKE 1 TABLET BY MOUTH ONCE  DAILY BEFORE BREAKFAST 90 tablet 0  . senna (SENOKOT) 8.6 MG TABS tablet Take 2 tablets by mouth daily as needed for mild constipation.    . temazepam (RESTORIL) 30 MG capsule Take 1 capsule (30 mg total) by mouth at bedtime as needed for sleep. 30 capsule 5  . tiZANidine (ZANAFLEX) 4 MG tablet One tablet twice daily (Patient taking differently: Take 6.5 mg by mouth 2 (two) times daily as needed for muscle spasms. ) 60 tablet 5   No current facility-administered medications for this visit.     Past Medical History:   Diagnosis Date  . Acute cholangitis   . Allergy   . Anemia   . Anxiety   . Barrett's esophagus   . Cataract   . Chronic back pain   . Chronic neck pain   . Depression   . Genital herpes   . GERD (gastroesophageal reflux disease)   . History of blood transfusion   . Hypertension   . Insomnia   . Lupus (systemic lupus erythematosus) ([place])   . Neuromuscular disorder ([place])   . Osteoarthritis   . S/P colonoscopy June 2005   normal, no polyps  . S/P endoscopy June 2005, Oct 2009   2005: short-segment Barrett's, 2009: short-segment Barrett's  . UTI (lower urinary tract infection) 11/2012    Past Surgical History:  Procedure Laterality Date  . ABDOMINAL HYSTERECTOMY    . BACK SURGERY    . BIOPSY N/A 03/20/2014   Procedure: BIOPSY;  Surgeon: Daneil Dolin, MD;  Location: [facility];  Service: Endoscopy;  Laterality: N/A;  . BIOPSY  09/14/2015   Procedure: BIOPSY;  Surgeon: Daneil Dolin, MD;  [facility];  Service: Endoscopy;;  esophageal and gastric  . CARPAL TUNNEL RELEASE Left 2013  . cervical disectomy  2002  . CHOLECYSTECTOMY     with lysis of adhesions for sbo; "ruptured gallbladder".  . COLONOSCOPY  11/09/2011   RMR: Melanosis coli  . COLONOSCOPY WITH PROPOFOL N/A 09/14/2015   Dr. Gala Romney: diverticulosis, 61mm TA removed. next TCS 09/2020.   . DENTAL SURGERY  11/2015   multiple tooth extraction  . ESOPHAGOGASTRODUODENOSCOPY  11/29/2007   salmon-colored  tongue   longest stable at  3 cm, distal esophagus as described previously status post biopsy/ Hiatal hernia, otherwise normal stomach D1 and D2  . ESOPHAGOGASTRODUODENOSCOPY  01/06/11   short segment Barrett's esophagus s/p bx/Hiatal hernia  . ESOPHAGOGASTRODUODENOSCOPY (EGD) WITH PROPOFOL N/A 03/20/2014   [id number] distal esophagus short segment barrett's, bx with no dysplasia. next egd in 03/2017  . ESOPHAGOGASTRODUODENOSCOPY (EGD) WITH PROPOFOL N/A 09/14/2015   Dr. Gala Romney: Barrett's without dysplasia, gastritis  benign bx, hiatal hernia. next EGD 09/2018.  Marland Kitchen HERNIA REPAIR Right 07/2010   Dr. Zada Girt  . JOINT REPLACEMENT    . LAPAROSCOPIC CHOLECYSTECTOMY  2017   at [facility]  . POLYPECTOMY  09/14/2015   Procedure: POLYPECTOMY;  Surgeon: Daneil Dolin, MD;  [facility];  Service: Endoscopy;;  ascending colon  . right hip replacement  07/2010   went back in sept 2012 to fix  . SHOULDER ARTHROSCOPY  2008   left  . TOTAL HIP REVISION Right 12/17/2012   Procedure: RIGHT TOTAL HIP REVISION;  Surgeon: Mauri Pole, MD;  [facility];  Service: Orthopedics;  Laterality: Right;  . WRIST SURGERY Right 2011   open reduction right wrist.    Family History  Problem Relation Age of Onset  . Hypertension Mother   .  Stroke Mother   . Colon cancer Neg Hx   . Anesthesia problems Neg Hx   . Hypotension Neg Hx   . Malignant hyperthermia Neg Hx   . Pseudochol deficiency Neg Hx   . Gastric cancer Neg Hx   . Esophageal cancer Neg Hx     Social History   Socioeconomic History  . Marital status: Married    Spouse name: louis  . Number of children: 4  . Years of education: 12+  . Highest education level: Some college, no degree  Occupational History  . Occupation: Press photographer  - retired  . Occupation: non Multimedia programmer  . Financial resource strain: Not hard at all  . Food insecurity    Worry: Never true    Inability: Never true  . Transportation needs    Medical: No    Non-medical: No  Tobacco Use  . Smoking status: Former Smoker    Packs/day: 0.25    Years: 25.00    Pack years: 6.25    Types: Cigarettes    Quit date: 02/07/2003    Years since quitting: 15.7  . Smokeless tobacco: Never Used  . Tobacco comment: quit in 2004  Substance and Sexual Activity  . Alcohol use: No  . Drug use: No  . Sexual activity: Yes    Birth control/protection: Surgical  Lifestyle  . Physical activity    Days per week: 2 days    Minutes per session: 40 min  . Stress: To some extent   Relationships  . Social connections    Talks on phone: More than three times a week    Gets together: Once a week    Attends religious service: Never    Active member of club or organization: No    Attends meetings of clubs or organizations: Never    Relationship status: Married  . Intimate partner violence    Fear of current or ex partner: No    Emotionally abused: No    Physically abused: No    Forced sexual activity: No  Other Topics Concern  . Not on file  Social History Narrative  . Not on file      ROS:  General: Negative for anorexia, weight loss, fever, chills, fatigue, weakness. Eyes: Negative for vision changes.  ENT: Negative for hoarseness, difficulty swallowing , nasal congestion. CV: Negative for chest pain, angina, palpitations, dyspnea on exertion, peripheral edema.  Respiratory: Negative for dyspnea at rest, dyspnea on exertion, cough, sputum, wheezing.  GI: See history of present illness. GU:  Negative for dysuria, hematuria, urinary incontinence, urinary frequency, nocturnal urination.  MS: Negative for joint pain, low back pain.  Derm: Negative for rash or itching.  Neuro: Negative for weakness, abnormal sensation, seizure, frequent headaches, memory loss, confusion.  Psych: Negative for anxiety, depression, suicidal ideation, hallucinations.  Endo: Negative for unusual weight change.  Heme: Negative for bruising or bleeding. Allergy: Negative for rash or hives.   Observations/Objective: Pleasant female in NAD. Otherwise exam not available.   Assessment and Plan: Pleasant 73 year old female with history of chronic GERD/Barrett's, opioid induced constipation.  She is doing well.  Reflux is well controlled on pantoprazole once daily.  Currently not having any issues with constipation.  Previous abdominal pain resolved.  She is not ready to schedule an EGD because of COVID.  We will see her back in the office in 6 months but if she decides she wants to have  an upper endoscopy before then she will let  us know and we can make arrangements.  Follow Up Instructions:    I discussed the assessment and treatment plan with the patient. The patient was provided an opportunity to ask questions and all were answered. The patient agreed with the plan and demonstrated an understanding of the instructions. AVS mailed to patient's home address.   The patient was advised to call back or seek an in-person evaluation if the symptoms worsen or if the condition fails to improve as anticipated.  I provided 7 minutes of non-face-to-face time during this encounter.   Neil Crouch, PA-C

## 2018-11-13 NOTE — Addendum Note (Signed)
Addended by: Mahala Menghini on: 11/13/2018 11:03 AM   Modules accepted: Orders

## 2018-11-14 ENCOUNTER — Other Ambulatory Visit: Payer: Self-pay | Admitting: Family Medicine

## 2018-11-16 ENCOUNTER — Ambulatory Visit (INDEPENDENT_AMBULATORY_CARE_PROVIDER_SITE_OTHER): Payer: Medicare Other | Admitting: Licensed Clinical Social Worker

## 2018-11-16 ENCOUNTER — Other Ambulatory Visit: Payer: Self-pay

## 2018-11-16 DIAGNOSIS — F3341 Major depressive disorder, recurrent, in partial remission: Secondary | ICD-10-CM | POA: Diagnosis not present

## 2018-11-17 ENCOUNTER — Encounter (HOSPITAL_COMMUNITY): Payer: Self-pay | Admitting: Licensed Clinical Social Worker

## 2018-11-17 NOTE — Progress Notes (Signed)
Virtual Visit via Video Note  I connected with Renee Waters on 11/17/18 at 10:00 AM EDT by video and verified that I am speaking with the correct person using two identifiers.   I discussed the limitations, risks, security and privacy concerns of performing an evaluation and management service by telephone and the availability of in person appointments. I also discussed with the patient that there may be a patient responsible charge related to this service. The patient expressed understanding and agreed to proceed.   History of Present Illness: Renee Waters presents oriented x5 (person, place, situation, time and object), alert, depressed, tearful, average height, thin, and cooperative to address mood. Patient has a history of medical treatment including hypertension and chronic pain. Patient has minimal history of mental health including outpatient therapy several years ago and medication management. Patient denies symptoms of mania. Patient denies suicidal and homicidal ideations. Patient denies psychosis including auditory and visual hallucinations. Patient denies substance use. Patient is at low risk for lethality at this time.    Observations/Objective: Physically: Patient is tired and has pain. She is managing but it is difficult.  Spiritually/values: No issues identified.  Relationships: Patient is getting along well with her family. She denies any conflict.  Emotional/Mental/Behavior: Patient's mood has been down but she is stable. Patient recognizes her mood is not much different than most people during the pandemic. She is frustrated with the social and political unrest in the country. Patient feels like politics has caused people to treat each other differently. Patient found out her roof has a leak in it but is working to get it repaired.   Patient engaged in session. She responded well to interventions. Patient continues to meet criteria for Major depressive disorder, recurrent episode,  moderate with anxious distress. Patient will continue in outpatient therapy due to being the least restrictive service to meet her needs. Patient made moderate progress on her goals  Assessment and Plan: Therapist reviewed patient's recent thoughts and behaviors. Therapist utilized CBT to address mood. Therapist reviewed patient's goals. Therapist processed patient's feelings to identify triggers for mood. Therapist discussed patient's mood and how the political environment is impacting her mood.    Suicidal/Homicidal: Negativewithout intent/plan  Follow Up Instructions: Plan: Return again in 4 weeks.   I discussed the assessment and treatment plan with the patient. The patient was provided an opportunity to ask questions and all were answered. The patient agreed with the plan and demonstrated an understanding of the instructions.   The patient was advised to call back or seek an in-person evaluation if the symptoms worsen or if the condition fails to improve as anticipated.  I provided 40 minutes of non-face-to-face time during this encounter.   Glori Bickers, LCSW
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
## 2018-11-28 ENCOUNTER — Other Ambulatory Visit: Payer: Self-pay | Admitting: Family Medicine

## 2018-11-28 DIAGNOSIS — Z79891 Long term (current) use of opiate analgesic: Secondary | ICD-10-CM | POA: Diagnosis not present

## 2018-11-28 DIAGNOSIS — I10 Essential (primary) hypertension: Secondary | ICD-10-CM | POA: Diagnosis not present

## 2018-11-28 DIAGNOSIS — E559 Vitamin D deficiency, unspecified: Secondary | ICD-10-CM | POA: Diagnosis not present

## 2018-11-28 DIAGNOSIS — M47816 Spondylosis without myelopathy or radiculopathy, lumbar region: Secondary | ICD-10-CM | POA: Diagnosis not present

## 2018-11-28 DIAGNOSIS — G894 Chronic pain syndrome: Secondary | ICD-10-CM | POA: Diagnosis not present

## 2018-11-28 DIAGNOSIS — M47812 Spondylosis without myelopathy or radiculopathy, cervical region: Secondary | ICD-10-CM | POA: Diagnosis not present

## 2018-11-28 DIAGNOSIS — Z79899 Other long term (current) drug therapy: Secondary | ICD-10-CM | POA: Diagnosis not present

## 2018-11-29 LAB — COMPREHENSIVE METABOLIC PANEL
ALT: 9 IU/L (ref 0–32)
AST: 16 IU/L (ref 0–40)
Albumin/Globulin Ratio: 1.3 (ref 1.2–2.2)
Albumin: 4.2 g/dL (ref 3.7–4.7)
Alkaline Phosphatase: 98 IU/L (ref 39–117)
BUN/Creatinine Ratio: 13 (ref 12–28)
BUN: 16 mg/dL (ref 8–27)
Bilirubin Total: 0.5 mg/dL (ref 0.0–1.2)
CO2: 25 mmol/L (ref 20–29)
Calcium: 9 mg/dL (ref 8.7–10.3)
Chloride: 103 mmol/L (ref 96–106)
Creatinine, Ser: 1.19 mg/dL — ABNORMAL HIGH (ref 0.57–1.00)
GFR calc Af Amer: 53 mL/min/{1.73_m2} — ABNORMAL LOW (ref >59)
GFR calc non Af Amer: 46 mL/min/{1.73_m2} — ABNORMAL LOW (ref >59)
Globulin, Total: 3.2 g/dL (ref 1.5–4.5)
Glucose: 87 mg/dL (ref 65–99)
Potassium: 3.6 mmol/L (ref 3.5–5.2)
Sodium: 141 mmol/L (ref 134–144)
Total Protein: 7.4 g/dL (ref 6.0–8.5)

## 2018-11-29 LAB — CBC WITH DIFFERENTIAL/PLATELET
Basophils Absolute: 0 10*3/uL (ref 0.0–0.2)
Basos: 1 %
EOS (ABSOLUTE): 0.1 10*3/uL (ref 0.0–0.4)
Eos: 5 %
Hematocrit: 38.3 % (ref 34.0–46.6)
Hemoglobin: 12.1 g/dL (ref 11.1–15.9)
Immature Grans (Abs): 0 10*3/uL (ref 0.0–0.1)
Immature Granulocytes: 0 %
Lymphocytes Absolute: 1 10*3/uL (ref 0.7–3.1)
Lymphs: 31 %
MCH: 30.3 pg (ref 26.6–33.0)
MCHC: 31.6 g/dL (ref 31.5–35.7)
MCV: 96 fL (ref 79–97)
Monocytes Absolute: 0.3 10*3/uL (ref 0.1–0.9)
Monocytes: 9 %
Neutrophils Absolute: 1.7 10*3/uL (ref 1.4–7.0)
Neutrophils: 54 %
Platelets: 222 10*3/uL (ref 150–450)
RBC: 3.99 x10E6/uL (ref 3.77–5.28)
RDW: 13.9 % (ref 11.7–15.4)
WBC: 3.1 10*3/uL — ABNORMAL LOW (ref 3.4–10.8)

## 2018-11-29 LAB — LIPID PANEL W/O CHOL/HDL RATIO
Cholesterol, Total: 145 mg/dL (ref 100–199)
HDL: 58 mg/dL (ref >39)
LDL Chol Calc (NIH): 72 mg/dL (ref 0–99)
Triglycerides: 74 mg/dL (ref 0–149)
VLDL Cholesterol Cal: 15 mg/dL (ref 5–40)

## 2018-11-29 LAB — VITAMIN D 25 HYDROXY (VIT D DEFICIENCY, FRACTURES): Vit D, 25-Hydroxy: 47.5 ng/mL (ref 30.0–100.0)

## 2018-11-29 LAB — TSH: TSH: 3.27 u[IU]/mL (ref 0.450–4.500)

## 2018-12-01 ENCOUNTER — Other Ambulatory Visit: Payer: Self-pay | Admitting: Family Medicine

## 2018-12-03 ENCOUNTER — Ambulatory Visit (INDEPENDENT_AMBULATORY_CARE_PROVIDER_SITE_OTHER): Payer: Medicare Other | Admitting: Family Medicine

## 2018-12-03 ENCOUNTER — Other Ambulatory Visit: Payer: Self-pay

## 2018-12-03 ENCOUNTER — Encounter: Payer: Self-pay | Admitting: Family Medicine

## 2018-12-03 VITALS — BP 114/74 | HR 58 | Resp 15 | Ht 65.0 in | Wt 119.0 lb

## 2018-12-03 DIAGNOSIS — Z23 Encounter for immunization: Secondary | ICD-10-CM

## 2018-12-03 DIAGNOSIS — F418 Other specified anxiety disorders: Secondary | ICD-10-CM | POA: Diagnosis not present

## 2018-12-03 DIAGNOSIS — G8929 Other chronic pain: Secondary | ICD-10-CM

## 2018-12-03 DIAGNOSIS — F5105 Insomnia due to other mental disorder: Secondary | ICD-10-CM | POA: Diagnosis not present

## 2018-12-03 DIAGNOSIS — Z1231 Encounter for screening mammogram for malignant neoplasm of breast: Secondary | ICD-10-CM | POA: Diagnosis not present

## 2018-12-03 DIAGNOSIS — I10 Essential (primary) hypertension: Secondary | ICD-10-CM

## 2018-12-03 DIAGNOSIS — M5440 Lumbago with sciatica, unspecified side: Secondary | ICD-10-CM

## 2018-12-03 MED ORDER — TEMAZEPAM 30 MG PO CAPS: 30.0000 mg | ORAL_CAPSULE | Freq: Every evening | ORAL | 5 refills | Status: DC | PRN

## 2018-12-03 MED ORDER — METHYLPREDNISOLONE ACETATE 80 MG/ML IJ SUSP
80.0000 mg | Freq: Once | INTRAMUSCULAR | Status: AC
  Administered 2018-12-03: 80 mg via INTRAMUSCULAR

## 2018-12-03 MED ORDER — ALPRAZOLAM 0.5 MG PO TABS: 0.5000 mg | ORAL_TABLET | Freq: Every day | ORAL | 5 refills | Status: DC

## 2018-12-03 NOTE — Patient Instructions (Addendum)
F/U in 6 months, call if you need me before  Please schedule mammogram at checkout due March 3 or after  Flu vaccine today  Labs are excellent  Memory is excellent  No med changes  Keep up the great work

## 2018-12-03 NOTE — Assessment & Plan Note (Signed)
Controlled, no change in medication DASH diet and commitment to daily physical activity for a minimum of 30 minutes discussed and encouraged, as a part of hypertension management. The importance of attaining a healthy weight is also discussed.  BP/Weight 12/03/2018 06/19/2018 01/18/2018 01/09/2018 11/29/2017 11/09/2017 [id number]  Systolic BP [id number]  Diastolic BP 74 73 50 70 81 80 67  Wt. (Lbs) 119 120 120.12 120.12 119 114 116.8  BMI 19.8 19.97 19.99 19.99 19.8 18.97 19.44  Some encounter information is confidential and restricted. Go to Review Flowsheets activity to see all data.

## 2018-12-03 NOTE — Assessment & Plan Note (Signed)
Sleep hygiene reviewed and written information offered also. Prescription sent for  medication needed.  

## 2018-12-03 NOTE — Assessment & Plan Note (Addendum)
Unchanged and managed by Pain clinic, reports  Mild flare to an 8 at times and requests depo medrol , same given IM

## 2018-12-03 NOTE — Assessment & Plan Note (Signed)
Controlled, no change in medication  

## 2018-12-03 NOTE — Progress Notes (Signed)
   Renee Waters     MRN: [id number]      DOB: 05/29/1945   HPI Renee Waters is here for follow up and re-evaluation of chronic medical conditions, medication management and review of any available recent lab and radiology data.  Preventive health is updated, specifically  Cancer screening and Immunization.   Questions or concerns regarding consultations or procedures which the PT has had in the interim are  addressed. The PT denies any adverse reactions to current medications since the last visit.  C/o difficulty finding words and conversation skills   ROS Denies recent fever or chills. Denies sinus pressure, nasal congestion, ear pain or sore throat. Denies chest congestion, productive cough or wheezing. Denies chest pains, palpitations and leg swelling Denies abdominal pain, nausea, vomiting,diarrhea or constipation.   Denies dysuria, frequency, hesitancy or incontinence. C/o chronic  joint pain, swelling and limitation in mobility. Denies headaches, seizures, numbness, or tingling. Denies uncontrolled epression, anxiety or insomnia. Denies skin break down or rash.   PE  BP 114/74   Pulse (!) 58   Resp 15   Ht 5\' 5"  (1.651 m)   Wt 119 lb (54 kg)   SpO2 99%   BMI 19.80 kg/m   Patient alert and oriented and in no cardiopulmonary distress.  HEENT: No facial asymmetry, EOMI,     Neck supple .  Chest: Clear to auscultation bilaterally.  CVS: S1, S2 no murmurs, no S3.Regular rate.  ABD: Soft non tender.   Ext: No edema  MS: Adequate though reduced  ROM spine, shoulders, hips and knees.Ambulates with a cane  Skin: Intact, no ulcerations or rash noted.  Psych: Good eye contact, normal affect. Memory intact not anxious or depressed appearing.  CNS: CN 2-12 intact, power,  normal throughout.no focal deficits noted.   Assessment & Plan  Essential hypertension Controlled, no change in medication DASH diet and commitment to daily physical activity for a minimum of 30  minutes discussed and encouraged, as a part of hypertension management. The importance of attaining a healthy weight is also discussed.  BP/Weight 12/03/2018 06/19/2018 01/18/2018 01/09/2018 11/29/2017 11/09/2017 [id number]  Systolic BP [id number] [id number] [id number]  Diastolic BP 74 73 50 70 81 80 67  Wt. (Lbs) 119 120 120.12 120.12 119 114 116.8  BMI 19.8 19.97 19.99 19.99 19.8 18.97 19.44  Some encounter information is confidential and restricted. Go to Review Flowsheets activity to see all data.       Backache Unchanged and managed by Pain clinic, reports  Mild flare to an 8 at times and requests depo medrol , same given IM  Depression with anxiety Controlled, no change in medication   Insomnia secondary to depression with anxiety Sleep hygiene reviewed and written information offered also. Prescription sent for  medication needed.

## 2018-12-04 ENCOUNTER — Ambulatory Visit (INDEPENDENT_AMBULATORY_CARE_PROVIDER_SITE_OTHER): Payer: Medicare Other | Admitting: Psychiatry

## 2018-12-04 ENCOUNTER — Encounter: Payer: Medicare Other | Admitting: Family Medicine

## 2018-12-04 ENCOUNTER — Ambulatory Visit: Payer: Self-pay

## 2018-12-04 ENCOUNTER — Encounter (HOSPITAL_COMMUNITY): Payer: Self-pay | Admitting: Psychiatry

## 2018-12-04 DIAGNOSIS — F330 Major depressive disorder, recurrent, mild: Secondary | ICD-10-CM | POA: Diagnosis not present

## 2018-12-04 MED ORDER — DULOXETINE HCL 20 MG PO CPEP: 40.0000 mg | ORAL_CAPSULE | Freq: Every day | ORAL | 1 refills | Status: DC

## 2018-12-10 ENCOUNTER — Ambulatory Visit (INDEPENDENT_AMBULATORY_CARE_PROVIDER_SITE_OTHER): Payer: Medicare Other | Admitting: Family Medicine

## 2018-12-10 ENCOUNTER — Other Ambulatory Visit: Payer: Self-pay

## 2018-12-10 ENCOUNTER — Encounter: Payer: Self-pay | Admitting: Family Medicine

## 2018-12-10 VITALS — BP 114/74 | HR 58 | Resp 15 | Ht 65.0 in | Wt 119.0 lb

## 2018-12-10 DIAGNOSIS — Z0000 Encounter for general adult medical examination without abnormal findings: Secondary | ICD-10-CM | POA: Diagnosis not present

## 2018-12-10 MED ORDER — ACYCLOVIR 800 MG PO TABS: ORAL_TABLET | ORAL | 3 refills | Status: DC

## 2018-12-10 NOTE — Progress Notes (Signed)
Subjective:   Renee Waters is a 73 y.o. female who presents for Medicare Annual (Subsequent) preventive examination.  Location of Patient: Home Location of Provider: Telehealth Consent was obtain for visit to be over via telehealth. I verified that I am speaking with the correct person using two identifiers.   Review of Systems:   Cardiac Risk Factors include: advanced age (>46men, >71 women);hypertension     Objective:     Vitals: BP 114/74   Pulse (!) 58   Resp 15   Ht 5\' 5"  (1.651 m)   Wt 119 lb (54 kg)   BMI 19.80 kg/m   Body mass index is 19.8 kg/m.  Advanced Directives 11/29/2017 08/20/2017 02/08/2017 02/04/2016 09/09/2015 03/20/2014 03/18/2014  Does Patient Have a Medical Advance Directive? No No No No No;Yes No No  Type of Advance Directive - - - - Press photographer;Living will - -  Does patient want to make changes to medical advance directive? - - - Yes (MAU/Ambulatory/Procedural Areas - Information given) - - -  Copy of [place] in Chart? - - - - No - copy requested - -  Would patient like information on creating a medical advance directive? No - Patient declined No - Patient declined No - Patient declined - No - patient declined information No - patient declined information No - patient declined information  Pre-existing out of facility DNR order (yellow form or pink MOST form) - - - - - - -    Tobacco Social History   Tobacco Use  Smoking Status Former Smoker  . Packs/day: 0.25  . Years: 25.00  . Pack years: 6.25  . Types: Cigarettes  . Quit date: 02/07/2003  . Years since quitting: 15.8  Smokeless Tobacco Never Used  Tobacco Comment   quit in 2004     Counseling given: Yes Comment: quit in 2004   Clinical Intake:  Pre-visit preparation completed: Yes  Pain : 0-10 Pain Score: 7  Pain Location: Back(and hip) Pain Orientation: Right, Lower Pain Descriptors / Indicators: Burning, Stabbing Pain Onset: More than a  month ago Pain Frequency: Constant Pain Relieving Factors: medicine but when it wears off the pain comes back Effect of Pain on Daily Activities: yes  Pain Relieving Factors: medicine but when it wears off the pain comes back  BMI - recorded: 19.8 Nutritional Status: BMI of 19-24  Normal Nutritional Risks: None Diabetes: No  How often do you need to have someone help you when you read instructions, pamphlets, or other written materials from your doctor or pharmacy?: 1 - Never What is the last grade level you completed in school?: 12 + some college  Interpreter Needed?: No     Past Medical History:  Diagnosis Date  . Acute cholangitis   . Allergy   . Anemia   . Anxiety   . Barrett's esophagus   . Cataract   . Chronic back pain   . Chronic neck pain   . Depression   . Genital herpes   . GERD (gastroesophageal reflux disease)   . History of blood transfusion   . Hypertension   . Insomnia   . Lupus (systemic lupus erythematosus) ([place])   . Neuromuscular disorder ([place])   . Osteoarthritis   . S/P colonoscopy June 2005   normal, no polyps  . S/P endoscopy June 2005, Oct 2009   2005: short-segment Barrett's, 2009: short-segment Barrett's  . UTI (lower urinary tract infection) 11/2012   Past  Surgical History:  Procedure Laterality Date  . ABDOMINAL HYSTERECTOMY    . BACK SURGERY    . BIOPSY N/A 03/20/2014   Procedure: BIOPSY;  Surgeon: Daneil Dolin, MD;  Location: [facility];  Service: Endoscopy;  Laterality: N/A;  . BIOPSY  09/14/2015   Procedure: BIOPSY;  Surgeon: Daneil Dolin, MD;  Location: [facility];  Service: Endoscopy;;  esophageal and gastric  . CARPAL TUNNEL RELEASE Left 2013  . cervical disectomy  2002  . CHOLECYSTECTOMY     with lysis of adhesions for sbo; "ruptured gallbladder".  . COLONOSCOPY  11/09/2011   RMR: Melanosis coli  . COLONOSCOPY WITH PROPOFOL N/A 09/14/2015   Dr. Gala Romney: diverticulosis, 13mm TA removed. next TCS 09/2020.   . DENTAL SURGERY   11/2015   multiple tooth extraction  . ESOPHAGOGASTRODUODENOSCOPY  11/29/2007   salmon-colored  tongue   longest stable at  3 cm, distal esophagus as described previously status post biopsy/ Hiatal hernia, otherwise normal stomach D1 and D2  . ESOPHAGOGASTRODUODENOSCOPY  01/06/11   short segment Barrett's esophagus s/p bx/Hiatal hernia  . ESOPHAGOGASTRODUODENOSCOPY (EGD) WITH PROPOFOL N/A 03/20/2014   [id number] distal esophagus short segment barrett's, bx with no dysplasia. next egd in 03/2017  . ESOPHAGOGASTRODUODENOSCOPY (EGD) WITH PROPOFOL N/A 09/14/2015   Dr. Gala Romney: Barrett's without dysplasia, gastritis benign bx, hiatal hernia. next EGD 09/2018.  Marland Kitchen HERNIA REPAIR Right 07/2010   Dr. Zada Girt  . JOINT REPLACEMENT    . LAPAROSCOPIC CHOLECYSTECTOMY  2017   at [facility]  . POLYPECTOMY  09/14/2015   Procedure: POLYPECTOMY;  Surgeon: Daneil Dolin, MD;  Location: [facility];  Service: Endoscopy;;  ascending colon  . right hip replacement  07/2010   went back in sept 2012 to fix  . SHOULDER ARTHROSCOPY  2008   left  . TOTAL HIP REVISION Right 12/17/2012   Procedure: RIGHT TOTAL HIP REVISION;  Surgeon: Mauri Pole, MD;  Location: [facility];  Service: Orthopedics;  Laterality: Right;  . WRIST SURGERY Right 2011   open reduction right wrist.   Family History  Problem Relation Age of Onset  . Hypertension Mother   . Stroke Mother   . Colon cancer Neg Hx   . Anesthesia problems Neg Hx   . Hypotension Neg Hx   . Malignant hyperthermia Neg Hx   . Pseudochol deficiency Neg Hx   . Gastric cancer Neg Hx   . Esophageal cancer Neg Hx    Social History   Socioeconomic History  . Marital status: Married    Spouse name: louis  . Number of children: 4  . Years of education: 12+  . Highest education level: Some college, no degree  Occupational History  . Occupation: Press photographer  - retired  . Occupation: non Multimedia programmer  . Financial resource strain: Not hard at all  . Food  insecurity    Worry: Never true    Inability: Never true  . Transportation needs    Medical: No    Non-medical: No  Tobacco Use  . Smoking status: Former Smoker    Packs/day: 0.25    Years: 25.00    Pack years: 6.25    Types: Cigarettes    Quit date: 02/07/2003    Years since quitting: 15.8  . Smokeless tobacco: Never Used  . Tobacco comment: quit in 2004  Substance and Sexual Activity  . Alcohol use: No  . Drug use: No  . Sexual activity: Yes    Birth control/protection:  Surgical  Lifestyle  . Physical activity    Days per week: 2 days    Minutes per session: 40 min  . Stress: To some extent  Relationships  . Social connections    Talks on phone: More than three times a week    Gets together: Once a week    Attends religious service: Never    Active member of club or organization: No    Attends meetings of clubs or organizations: Never    Relationship status: Married  Other Topics Concern  . Not on file  Social History Narrative  . Not on file    Outpatient Encounter Medications as of 12/10/2018  Medication Sig  . acyclovir (ZOVIRAX) 800 MG tablet TAKE 1 TABLET BY MOUTH 4 TIMES DAILY AS NEEDED FOR  BREAKOUTS  . [START ON 12/28/2018] ALPRAZolam (XANAX) 0.5 MG tablet Take 1 tablet (0.5 mg total) by mouth at bedtime.  . DULoxetine (CYMBALTA) 20 MG capsule Take 2 capsules (40 mg total) by mouth daily.  Marland Kitchen EPINEPHRINE 0.3 mg/0.3 mL IJ SOAJ injection INJECT 0.3 MLS INTO MUSCLE ONCE AS NEEDED  . gabapentin (NEURONTIN) 300 MG capsule TAKE 1 CAPSULE BY MOUTH ONCE DAILY FOR  CHRONIC  PAIN  . hydrochlorothiazide (HYDRODIURIL) 25 MG tablet Take 1 tablet by mouth once daily  . meloxicam (MOBIC) 7.5 MG tablet TAKE 1 TABLET BY MOUTH ONCE DAILY (Patient taking differently: as needed. )  . Misc Natural Products (TURMERIC CURCUMIN) CAPS Take by mouth. Once daily  . Multiple Vitamins-Minerals (MULTIVITAMINS THER. W/MINERALS) TABS Take 1 tablet by mouth daily.   . Oxycodone HCl 10 MG  TABS Take 1 tablet (10 mg total) by mouth 4 (four) times daily. (Patient taking differently: Take 10 mg by mouth every 6 (six) hours as needed (pain). )  . pantoprazole (PROTONIX) 40 MG tablet TAKE 1 TABLET BY MOUTH ONCE DAILY BEFORE BREAKFAST  . senna (SENOKOT) 8.6 MG TABS tablet Take 2 tablets by mouth daily as needed for mild constipation.  . temazepam (RESTORIL) 30 MG capsule Take 1 capsule (30 mg total) by mouth at bedtime as needed for sleep.  Derrill Memo ON 12/19/2018] temazepam (RESTORIL) 30 MG capsule Take 1 capsule (30 mg total) by mouth at bedtime as needed for sleep.  Marland Kitchen tiZANidine (ZANAFLEX) 4 MG tablet One tablet twice daily (Patient taking differently: Take 6.5 mg by mouth 2 (two) times daily as needed for muscle spasms. )  . UNABLE TO FIND neuronol capsules  Twice daily  . [DISCONTINUED] acyclovir (ZOVIRAX) 800 MG tablet TAKE 1 TABLET BY MOUTH 4 TIMES DAILY AS NEEDED FOR  BREAKOUTS   No facility-administered encounter medications on file as of 12/10/2018.     Activities of Daily Living In your present state of health, do you have any difficulty performing the following activities: 12/10/2018 12/03/2018  Hearing? N N  Vision? N N  Difficulty concentrating or making decisions? N N  Walking or climbing stairs? Y Y  Comment - severe arthritis  Dressing or bathing? N N  Doing errands, shopping? Y N  Comment sometimes -  Conservation officer, nature and eating ? N -  Using the Toilet? N -  In the past six months, have you accidently leaked urine? N -  Do you have problems with loss of bowel control? N -  Managing your Medications? N -  Managing your Finances? N -  Housekeeping or managing your Housekeeping? N -  Some recent data might be hidden    Patient Care Team:  [name], MD as PCP - General Rourk, [name], MD (Gastroenterology) Carole Civil, MD as Consulting Physician (Orthopedic Surgery)    Assessment:   This is a routine wellness examination for [place].   Exercise Activities and Dietary recommendations Current Exercise Habits: Home exercise routine, Type of exercise: calisthenics;strength training/weights, Time (Minutes): 10, Frequency (Times/Week): 7, Weekly Exercise (Minutes/Week): 70, Intensity: Mild, Exercise limited by: None identified  Goals    . Patient Stated     [facility]     . Patient Stated     I have enough on my plate, but do want to also stay calm        Fall Risk Fall Risk  12/10/2018 12/03/2018 06/19/2018 01/18/2018 01/09/2018  Falls in the past year? 0 0 0 0 0  Comment - - - - -  Number falls in past yr: 0 0 0 0 0  Injury with Fall? 0 0 0 0 0  Risk for fall due to : - - - - -  Follow up - - - - -   Is the patient's home free of loose throw rugs in walkways, pet beds, electrical cords, etc?   yes         Grab bars in the bathroom? yes      Handrails on the stairs?   yes      Adequate lighting?   yes    Depression Screen PHQ 2/9 Scores 12/10/2018 12/03/2018 01/18/2018 01/09/2018  PHQ - 2 Score 3 0 0 0  PHQ- 9 Score 8 6 3 3      Cognitive Function MMSE - Mini Mental State Exam 12/03/2018  Orientation to time 5  Orientation to Place 5  Registration 3  Attention/ Calculation 5  Recall 3  Language- name 2 objects 2  Language- repeat 1  Language- follow 3 step command 3  Language- read & follow direction 1  Write a sentence 1  Copy design 1  Total score 30     6CIT Screen 12/10/2018 11/29/2017 02/08/2017 02/04/2016  What Year? 0 points 0 points 0 points 0 points  What month? 0 points 0 points 0 points 0 points  What time? 0 points 0 points 0 points 0 points  Count back from 20 0 points 0 points 0 points 0 points  Months in reverse 0 points 0 points 0 points 0 points  Repeat phrase 0 points 2 points 0 points 0 points  Total Score 0 2 0 0    Immunization History  Administered Date(s) Administered  . Fluad Quad(high Dose 65+) 12/03/2018  . Influenza Split  11/08/2010, 12/06/2011  . Influenza Whole 11/06/2006, 11/24/2009  . Influenza, High Dose Seasonal PF 11/09/2017  . Influenza,inj,Quad PF,6+ Mos 10/31/2012, 01/14/2014, 11/25/2014, 10/05/2015, 11/24/2016  . Pneumococcal Conjugate-13 09/17/2013  . Pneumococcal Polysaccharide-23 02/22/2011  . Td 07/31/2007  . Tdap 09/05/2017  . Zoster 07/31/2007    Qualifies for Shingles Vaccine? completed  Screening Tests Health Maintenance  Topic Date Due  . MAMMOGRAM  04/08/2020  . COLONOSCOPY  09/13/2025  . TETANUS/TDAP  09/06/2027  . INFLUENZA VACCINE  Completed  . DEXA SCAN  Completed  . Hepatitis C Screening  Completed  . PNA vac Low Risk Adult  Completed    Cancer Screenings: Lung: Low Dose CT Chest recommended if Age 73-80 years, 30 pack-year currently smoking OR have quit w/in 15years. Patient does not qualify. Breast:  Up to date on Mammogram? Yes  Up to date of Bone Density/Dexa? Yes Colorectal: Due 2027  Additional Screenings:  Hepatitis C Screening:  completed     Plan:      1. Encounter for Medicare annual wellness exam  I have personally reviewed and noted the following in the patient's chart:   . Medical and social history . Use of alcohol, tobacco or illicit drugs  . Current medications and supplements . Functional ability and status . Nutritional status . Physical activity . Advanced directives . List of other physicians . Hospitalizations, surgeries, and ER visits in previous 12 months . Vitals . Screenings to include cognitive, depression, and falls . Referrals and appointments  In addition, I have reviewed and discussed with patient certain preventive protocols, quality metrics, and best practice recommendations. A written personalized care plan for preventive services as well as general preventive health recommendations were provided to patient.    I provided 20 minutes of non-face-to-face time during this encounter.   Perlie Mayo, NP  12/10/2018

## 2018-12-10 NOTE — Patient Instructions (Addendum)
Renee Waters , Thank you for taking time to come for your Medicare Wellness Visit. I appreciate your ongoing commitment to your health goals. Please review the following plan we discussed and let me know if I can assist you in the future.   Please continue to practice social distancing to keep you, your family, and our community safe.  If you must go out, please wear a Mask and practice good handwashing.  Please have a safe, happy, and healthy holiday season. Call us if you need anything before your next appt.  Screening recommendations/referrals: Colonoscopy: Due 2027 Mammogram: Up to date Bone Density: Up to date Recommended yearly ophthalmology/optometry visit for glaucoma screening and checkup Recommended yearly dental visit for hygiene and checkup  Vaccinations: Influenza vaccine: Completed, Due 2021 Fall  Pneumococcal vaccine: Completed Tdap vaccine: Up to date Shingles vaccine: Completed  Advanced directives:  You reported you are completing this  Conditions/risks identified: Falls  Next appointment: 06/04/2019    Preventive Care 73 Years and Older, Female Preventive care refers to lifestyle choices and visits with your health care provider that can promote health and wellness. What does preventive care include?  A yearly physical exam. This is also called an annual well check.  Dental exams once or twice a year.  Routine eye exams. Ask your health care provider how often you should have your eyes checked.  Personal lifestyle choices, including:  Daily care of your teeth and gums.  Regular physical activity.  Eating a healthy diet.  Avoiding tobacco and drug use.  Limiting alcohol use.  Practicing safe sex.  Taking low-dose aspirin every day.  Taking vitamin and mineral supplements as recommended by your health care provider. What happens during an annual well check? The services and screenings done by your health care provider during your annual well  check will depend on your age, overall health, lifestyle risk factors, and family history of disease. Counseling  Your health care provider may ask you questions about your:  Alcohol use.  Tobacco use.  Drug use.  Emotional well-being.  Home and relationship well-being.  Sexual activity.  Eating habits.  History of falls.  Memory and ability to understand (cognition).  Work and work Statistician.  Reproductive health. Screening  You may have the following tests or measurements:  Height, weight, and BMI.  Blood pressure.  Lipid and cholesterol levels. These may be checked every 5 years, or more frequently if you are over 60 years old.  Skin check.  Lung cancer screening. You may have this screening every year starting at age 38 if you have a 30-pack-year history of smoking and currently smoke or have quit within the past 15 years.  Fecal occult blood test (FOBT) of the stool. You may have this test every year starting at age 36.  Flexible sigmoidoscopy or colonoscopy. You may have a sigmoidoscopy every 5 years or a colonoscopy every 10 years starting at age 43.  Hepatitis C blood test.  Hepatitis B blood test.  Sexually transmitted disease (STD) testing.  Diabetes screening. This is done by checking your blood sugar (glucose) after you have not eaten for a while (fasting). You may have this done every 1-3 years.  Bone density scan. This is done to screen for osteoporosis. You may have this done starting at age 20.  Mammogram. This may be done every 1-2 years. Talk to your health care provider about how often you should have regular mammograms. Talk with your health care provider about your test  results, treatment options, and if necessary, the need for more tests. Vaccines  Your health care provider may recommend certain vaccines, such as:  Influenza vaccine. This is recommended every year.  Tetanus, diphtheria, and acellular pertussis (Tdap, Td) vaccine. You  may need a Td booster every 10 years.  Zoster vaccine. You may need this after age 75.  Pneumococcal 13-valent conjugate (PCV13) vaccine. One dose is recommended after age 39.  Pneumococcal polysaccharide (PPSV23) vaccine. One dose is recommended after age 51. Talk to your health care provider about which screenings and vaccines you need and how often you need them. This information is not intended to replace advice given to you by your health care provider. Make sure you discuss any questions you have with your health care provider. Document Released: 02/20/2015 Document Revised: 10/14/2015 Document Reviewed: 11/25/2014 Elsevier Interactive Patient Education  2017 [place] Prevention in the Home Falls can cause injuries. They can happen to people of all ages. There are many things you can do to make your home safe and to help prevent falls. What can I do on the outside of my home?  Regularly fix the edges of walkways and driveways and fix any cracks.  Remove anything that might make you trip as you walk through a door, such as a raised step or threshold.  Trim any bushes or trees on the path to your home.  Use bright outdoor lighting.  Clear any walking paths of anything that might make someone trip, such as rocks or tools.  Regularly check to see if handrails are loose or broken. Make sure that both sides of any steps have handrails.  Any raised decks and porches should have guardrails on the edges.  Have any leaves, snow, or ice cleared regularly.  Use sand or salt on walking paths during winter.  Clean up any spills in your garage right away. This includes oil or grease spills. What can I do in the bathroom?  Use night lights.  Install grab bars by the toilet and in the tub and shower. Do not use towel bars as grab bars.  Use non-skid mats or decals in the tub or shower.  If you need to sit down in the shower, use a plastic, non-slip stool.  Keep the floor  dry. Clean up any water that spills on the floor as soon as it happens.  Remove soap buildup in the tub or shower regularly.  Attach bath mats securely with double-sided non-slip rug tape.  Do not have throw rugs and other things on the floor that can make you trip. What can I do in the bedroom?  Use night lights.  Make sure that you have a light by your bed that is easy to reach.  Do not use any sheets or blankets that are too big for your bed. They should not hang down onto the floor.  Have a firm chair that has side arms. You can use this for support while you get dressed.  Do not have throw rugs and other things on the floor that can make you trip. What can I do in the kitchen?  Clean up any spills right away.  Avoid walking on wet floors.  Keep items that you use a lot in easy-to-reach places.  If you need to reach something above you, use a strong step stool that has a grab bar.  Keep electrical cords out of the way.  Do not use floor polish or wax that makes  floors slippery. If you must use wax, use non-skid floor wax.  Do not have throw rugs and other things on the floor that can make you trip. What can I do with my stairs?  Do not leave any items on the stairs.  Make sure that there are handrails on both sides of the stairs and use them. Fix handrails that are broken or loose. Make sure that handrails are as long as the stairways.  Check any carpeting to make sure that it is firmly attached to the stairs. Fix any carpet that is loose or worn.  Avoid having throw rugs at the top or bottom of the stairs. If you do have throw rugs, attach them to the floor with carpet tape.  Make sure that you have a light switch at the top of the stairs and the bottom of the stairs. If you do not have them, ask someone to add them for you. What else can I do to help prevent falls?  Wear shoes that:  Do not have high heels.  Have rubber bottoms.  Are comfortable and fit you  well.  Are closed at the toe. Do not wear sandals.  If you use a stepladder:  Make sure that it is fully opened. Do not climb a closed stepladder.  Make sure that both sides of the stepladder are locked into place.  Ask someone to hold it for you, if possible.  Clearly mark and make sure that you can see:  Any grab bars or handrails.  First and last steps.  Where the edge of each step is.  Use tools that help you move around (mobility aids) if they are needed. These include:  Canes.  Walkers.  Scooters.  Crutches.  Turn on the lights when you go into a dark area. Replace any light bulbs as soon as they burn out.  Set up your furniture so you have a clear path. Avoid moving your furniture around.  If any of your floors are uneven, fix them.  If there are any pets around you, be aware of where they are.  Review your medicines with your doctor. Some medicines can make you feel dizzy. This can increase your chance of falling. Ask your doctor what other things that you can do to help prevent falls. This information is not intended to replace advice given to you by your health care provider. Make sure you discuss any questions you have with your health care provider. Document Released: 11/20/2008 Document Revised: 07/02/2015 Document Reviewed: 02/28/2014 Elsevier Interactive Patient Education  2017 [employer].

## 2018-12-12 ENCOUNTER — Encounter: Payer: Medicare Other | Admitting: Family Medicine

## 2018-12-17 ENCOUNTER — Ambulatory Visit: Payer: Self-pay

## 2018-12-24 ENCOUNTER — Other Ambulatory Visit: Payer: Self-pay

## 2018-12-24 ENCOUNTER — Encounter (HOSPITAL_COMMUNITY): Payer: Self-pay | Admitting: Licensed Clinical Social Worker

## 2018-12-24 ENCOUNTER — Ambulatory Visit (INDEPENDENT_AMBULATORY_CARE_PROVIDER_SITE_OTHER): Payer: Medicare Other | Admitting: Licensed Clinical Social Worker

## 2018-12-24 DIAGNOSIS — F3341 Major depressive disorder, recurrent, in partial remission: Secondary | ICD-10-CM

## 2018-12-24 NOTE — Progress Notes (Signed)
Virtual Visit via Video Note  I connected with Renee Waters on 12/24/18 at  1:00 PM EST by video and verified that I am speaking with the correct person using two identifiers.   I discussed the limitations, risks, security and privacy concerns of performing an evaluation and management service by telephone and the availability of in person appointments. I also discussed with the patient that there may be a patient responsible charge related to this service. The patient expressed understanding and agreed to proceed.   History of Present Illness: Renee Waters presents oriented x5 (person, place, situation, time and object), alert, depressed, tearful, average height, thin, and cooperative to address mood. Patient has a history of medical treatment including hypertension and chronic pain. Patient has minimal history of mental health including outpatient therapy several years ago and medication management. Patient denies symptoms of mania. Patient denies suicidal and homicidal ideations. Patient denies psychosis including auditory and visual hallucinations. Patient denies substance use. Patient is at low risk for lethality at this time.    Observations/Objective: Physically: Patient is doing ok physically. She has no specific complaints with sleep, appetite, or pain level. Spiritually/values: No issues identified.  Relationships: Patient is getting along well with her family. She recently had a birthday and celebrated with her children. Her son's have been repairing her roof as well. Patient is looking forward to spending time with her family during the holidays Emotional/Mental/Behavior: Patient's mood is greatly improved. She feels like everything has fallen into place and her stressors have been resolved. Patient feels like her medication is working as well. Patient is planning on maintaining her mood by doing all the small things on a consistent basis.   Patient engaged in session. She responded well to  interventions. Patient continues to meet criteria for Major depressive disorder, recurrent episode, moderate with anxious distress. Patient will continue in outpatient therapy due to being the least restrictive service to meet her needs. Patient made moderate progress on her goals  Assessment and Plan: Therapist reviewed patient's recent thoughts and behaviors. Therapist utilized CBT to address mood. Therapist reviewed patient's goals. Therapist processed patient's feelings to identify triggers for mood. Therapist discussed patient progress and what has gone well for her.    Suicidal/Homicidal: Negativewithout intent/plan  Follow Up Instructions: Plan: Return again in 4 weeks.   I discussed the assessment and treatment plan with the patient. The patient was provided an opportunity to ask questions and all were answered. The patient agreed with the plan and demonstrated an understanding of the instructions.   The patient was advised to call back or seek an in-person evaluation if the symptoms worsen or if the condition fails to improve as anticipated.  I provided 30 minutes of non-face-to-face time during this encounter.   Glori Bickers, LCSW

## 2019-02-12 ENCOUNTER — Ambulatory Visit (INDEPENDENT_AMBULATORY_CARE_PROVIDER_SITE_OTHER): Payer: Medicare Other | Admitting: Licensed Clinical Social Worker

## 2019-02-12 DIAGNOSIS — F3341 Major depressive disorder, recurrent, in partial remission: Secondary | ICD-10-CM

## 2019-02-12 NOTE — Progress Notes (Signed)
Virtual Visit via Video Note  I connected with Renee Waters on 02/12/19 at  2:00 PM EST by video and verified that I am speaking with the correct person using two identifiers.   I discussed the limitations, risks, security and privacy concerns of performing an evaluation and management service by telephone and the availability of in person appointments. I also discussed with the patient that there may be a patient responsible charge related to this service. The patient expressed understanding and agreed to proceed.   History of Present Illness: Renee Waters presents oriented x5 (person, place, situation, time and object), alert, depressed, tearful, average height, thin, and cooperative to address mood. Patient has a history of medical treatment including hypertension and chronic pain. Patient has minimal history of mental health including outpatient therapy several years ago and medication management. Patient denies symptoms of mania. Patient denies suicidal and homicidal ideations. Patient denies psychosis including auditory and visual hallucinations. Patient denies substance use. Patient is at low risk for lethality at this time.    Observations/Objective: Physically: Patient is doing ok physically. Patient continues to have disrupted sleep at times. She has aches and pains but is managing them. Patient feels like she is doing better overall.  Spiritually/values: No issues identified.  Relationships: Patient is getting along well with her family. Her husband is feeling a little better which helps both him and her. She had a "quiet" holiday. She feels  Emotional/Mental/Behavior: Patient's mood is greatly improved. Patient is accepting things for what they are. She has decided not to "stress" over things. She recently had a car "break down" but has chosen not to worry because they still have a car that works and they will fix the one that broke down. Patient feels like she has achieved her goals in treatment.  She understood that if she needs to return to therapy she can.   Patient engaged in session. She responded well to interventions. Patient continues to meet criteria for Major depressive disorder, recurrent episode, moderate with anxious distress. Patient will continue in outpatient therapy due to being the least restrictive service to meet her needs. Patient achieved her goals  Assessment and Plan: Therapist reviewed patient's recent thoughts and behaviors. Therapist utilized CBT to address mood. Therapist reviewed patient's goals. Therapist processed patient's feelings to identify triggers for mood. Therapist discussed patient progress and what has gone well for her.    Suicidal/Homicidal: Negativewithout intent/plan  Follow Up Instructions: Plan: Return to treatment if needed.    I discussed the assessment and treatment plan with the patient. The patient was provided an opportunity to ask questions and all were answered. The patient agreed with the plan and demonstrated an understanding of the instructions.   The patient was advised to call back or seek an in-person evaluation if the symptoms worsen or if the condition fails to improve as anticipated.  I provided 30 minutes of non-face-to-face time during this encounter.   Glori Bickers, LCSW

## 2019-02-27 DIAGNOSIS — M545 Low back pain: Secondary | ICD-10-CM | POA: Diagnosis not present

## 2019-02-27 DIAGNOSIS — R768 Other specified abnormal immunological findings in serum: Secondary | ICD-10-CM | POA: Diagnosis not present

## 2019-02-27 DIAGNOSIS — M150 Primary generalized (osteo)arthritis: Secondary | ICD-10-CM | POA: Diagnosis not present

## 2019-02-27 DIAGNOSIS — M791 Myalgia, unspecified site: Secondary | ICD-10-CM | POA: Diagnosis not present

## 2019-02-27 DIAGNOSIS — M2550 Pain in unspecified joint: Secondary | ICD-10-CM | POA: Diagnosis not present

## 2019-03-06 ENCOUNTER — Ambulatory Visit: Payer: Medicare Other | Admitting: Psychiatry

## 2019-03-27 ENCOUNTER — Other Ambulatory Visit: Payer: Self-pay

## 2019-03-27 ENCOUNTER — Encounter: Payer: Self-pay | Admitting: Psychiatry

## 2019-03-27 ENCOUNTER — Ambulatory Visit (INDEPENDENT_AMBULATORY_CARE_PROVIDER_SITE_OTHER): Payer: Medicare Other | Admitting: Psychiatry

## 2019-03-27 DIAGNOSIS — F3342 Major depressive disorder, recurrent, in full remission: Secondary | ICD-10-CM | POA: Diagnosis not present

## 2019-03-27 MED ORDER — DULOXETINE HCL 20 MG PO CPEP: 40.0000 mg | ORAL_CAPSULE | Freq: Every day | ORAL | 1 refills | Status: DC

## 2019-03-27 NOTE — Progress Notes (Signed)
[facility] MD OP Progress Note  I connected with  Renee Waters on 03/27/19 by a video enabled telemedicine application and verified that I am speaking with the correct person using two identifiers.   I discussed the limitations of evaluation and management by telemedicine. The patient expressed understanding and agreed to proceed.    03/27/2019 11:31 AM Renee Waters  MRN:  [id number]  Chief Complaint: " I am doing good."  HPI: Pt reported that she is doing well. She found increase in the dose of Duloxetine to 40 mg to be helpful.  She informed that her therapist stated that she met the goals for therapy and she no longer needed him.  She stated that she is in good spirits and staying busy.  She informed that she takes temazepam for sleep which helps,howver, some nights she does not sleep well due to her chronic pain.  She denied any acute issues or concerns at this time.  Visit Diagnosis:    ICD-10-CM   1. MDD (major depressive disorder), recurrent, in full remission ([place])  F33.42     Past Psychiatric History: MDD  Past Medical History:  Past Medical History:  Diagnosis Date  . Acute cholangitis   . Allergy   . Anemia   . Anxiety   . Barrett's esophagus   . Cataract   . Chronic back pain   . Chronic neck pain   . Depression   . Genital herpes   . GERD (gastroesophageal reflux disease)   . History of blood transfusion   . Hypertension   . Insomnia   . Lupus (systemic lupus erythematosus) ([place])   . Neuromuscular disorder ([place])   . Osteoarthritis   . S/P colonoscopy June 2005   normal, no polyps  . S/P endoscopy June 2005, Oct 2009   2005: short-segment Barrett's, 2009: short-segment Barrett's  . UTI (lower urinary tract infection) 11/2012    Past Surgical History:  Procedure Laterality Date  . ABDOMINAL HYSTERECTOMY    . BACK SURGERY    . BIOPSY N/A 03/20/2014   Procedure: BIOPSY;  Surgeon: Daneil Dolin, MD;  Location: [facility];  Service: Endoscopy;  Laterality: N/A;  .  BIOPSY  09/14/2015   Procedure: BIOPSY;  Surgeon: Daneil Dolin, MD;  Location: [facility];  Service: Endoscopy;;  esophageal and gastric  . CARPAL TUNNEL RELEASE Left 2013  . cervical disectomy  2002  . CHOLECYSTECTOMY     with lysis of adhesions for sbo; "ruptured gallbladder".  . COLONOSCOPY  11/09/2011   RMR: Melanosis coli  . COLONOSCOPY WITH PROPOFOL N/A 09/14/2015   Dr. Gala Romney: diverticulosis, 45m TA removed. next TCS 09/2020.   . DENTAL SURGERY  11/2015   multiple tooth extraction  . ESOPHAGOGASTRODUODENOSCOPY  11/29/2007   salmon-colored  tongue   longest stable at  3 cm, distal esophagus as described previously status post biopsy/ Hiatal hernia, otherwise normal stomach D1 and D2  . ESOPHAGOGASTRODUODENOSCOPY  01/06/11   short segment Barrett's esophagus s/p bx/Hiatal hernia  . ESOPHAGOGASTRODUODENOSCOPY (EGD) WITH PROPOFOL N/A 03/20/2014   RFGH:WEXHBZJIdistal esophagus short segment barrett's, bx with no dysplasia. next egd in 03/2017  . ESOPHAGOGASTRODUODENOSCOPY (EGD) WITH PROPOFOL N/A 09/14/2015   Dr. RGala Romney Barrett's without dysplasia, gastritis benign bx, hiatal hernia. next EGD 09/2018.  .Marland KitchenHERNIA REPAIR Right 07/2010   Dr. BZada Girt . JOINT REPLACEMENT    . [facility]   [facility] . POLYPECTOMY  09/14/2015   Procedure: POLYPECTOMY;  Surgeon: RHerbie Baltimore  Hilton Cork, MD;  Location: [facility];  Service: Endoscopy;;  ascending colon  . right hip replacement  07/2010   went back in sept 2012 to fix  . SHOULDER ARTHROSCOPY  2008   left  . TOTAL HIP REVISION Right 12/17/2012   Procedure: RIGHT TOTAL HIP REVISION;  Surgeon: Mauri Pole, MD;  Location: [facility];  Service: Orthopedics;  Laterality: Right;  . WRIST SURGERY Right 2011   open reduction right wrist.    Family Psychiatric History: denied  Family History:  Family History  Problem Relation Age of Onset  . Hypertension Mother   . Stroke Mother   . Colon cancer Neg Hx   . Anesthesia problems Neg  Hx   . Hypotension Neg Hx   . Malignant hyperthermia Neg Hx   . Pseudochol deficiency Neg Hx   . Gastric cancer Neg Hx   . Esophageal cancer Neg Hx     Social History:  Social History   Socioeconomic History  . Marital status: Married    Spouse name: louis  . Number of children: 4  . Years of education: 12+  . Highest education level: Some college, no degree  Occupational History  . Occupation: Press photographer  - retired  . Occupation: non profit  Tobacco Use  . Smoking status: Former Smoker    Packs/day: 0.25    Years: 25.00    Pack years: 6.25    Types: Cigarettes    Quit date: 02/07/2003    Years since quitting: 16.1  . Smokeless tobacco: Never Used  . Tobacco comment: quit in 2004  Substance and Sexual Activity  . Alcohol use: No  . Drug use: No  . Sexual activity: Yes    Birth control/protection: Surgical  Other Topics Concern  . Not on file  Social History Narrative  . Not on file   Social Determinants of Health   Financial Resource Strain:   . Difficulty of Paying Living Expenses: Not on file  Food Insecurity:   . Worried About Charity fundraiser in the Last Year: Not on file  . Ran Out of Food in the Last Year: Not on file  Transportation Needs:   . Lack of Transportation (Medical): Not on file  . Lack of Transportation (Non-Medical): Not on file  Physical Activity:   . Days of Exercise per Week: Not on file  . Minutes of Exercise per Session: Not on file  Stress:   . Feeling of Stress : Not on file  Social Connections:   . Frequency of Communication with Friends and Family: Not on file  . Frequency of Social Gatherings with Friends and Family: Not on file  . Attends Religious Services: Not on file  . Active Member of Clubs or Organizations: Not on file  . Attends Archivist Meetings: Not on file  . Marital Status: Not on file    Allergies:  Allergies  Allergen Reactions  . Bee Venom Swelling  . Tyloxapol   . Oxycodone-Acetaminophen  Rash    Pt states, "this gives her a rash, but at home she takes oxycodone for pain relief"    Metabolic Disorder Labs: Lab Results  Component Value Date   HGBA1C 6.0 (H) 11/03/2014   MPG 126 (H) 11/03/2014   MPG 117 (H) 07/16/2013   No results found for: PROLACTIN Lab Results  Component Value Date   CHOL 145 11/28/2018   TRIG 74 11/28/2018   HDL 58 11/28/2018   CHOLHDL 2.4 12/07/2016  VLDL 12 11/03/2014   LDLCALC 72 11/28/2018   LDLCALC 62 12/07/2016   Lab Results  Component Value Date   TSH 3.270 11/28/2018   TSH 1.120 12/07/2016    Therapeutic Level Labs: No results found for: LITHIUM No results found for: VALPROATE No components found for:  CBMZ  Current Medications: Current Outpatient Medications  Medication Sig Dispense Refill  . acyclovir (ZOVIRAX) 800 MG tablet TAKE 1 TABLET BY MOUTH 4 TIMES DAILY AS NEEDED FOR  BREAKOUTS 30 tablet 3  . ALPRAZolam (XANAX) 0.5 MG tablet Take 1 tablet (0.5 mg total) by mouth at bedtime. 30 tablet 5  . DULoxetine (CYMBALTA) 20 MG capsule Take 2 capsules (40 mg total) by mouth daily. 180 capsule 1  . EPINEPHRINE 0.3 mg/0.3 mL IJ SOAJ injection INJECT 0.3 MLS INTO MUSCLE ONCE AS NEEDED 1 Device 2  . gabapentin (NEURONTIN) 300 MG capsule TAKE 1 CAPSULE BY MOUTH ONCE DAILY FOR  CHRONIC  PAIN 90 capsule 0  . hydrochlorothiazide (HYDRODIURIL) 25 MG tablet Take 1 tablet by mouth once daily 90 tablet 0  . meloxicam (MOBIC) 7.5 MG tablet TAKE 1 TABLET BY MOUTH ONCE DAILY (Patient taking differently: as needed. ) 30 tablet 5  . Misc Natural Products (TURMERIC CURCUMIN) CAPS Take by mouth. Once daily    . Multiple Vitamins-Minerals (MULTIVITAMINS THER. W/MINERALS) TABS Take 1 tablet by mouth daily.     . Oxycodone HCl 10 MG TABS Take 1 tablet (10 mg total) by mouth 4 (four) times daily. (Patient taking differently: Take 10 mg by mouth every 6 (six) hours as needed (pain). ) 56 tablet 0  . pantoprazole (PROTONIX) 40 MG tablet TAKE 1 TABLET BY  MOUTH ONCE DAILY BEFORE BREAKFAST 90 tablet 3  . senna (SENOKOT) 8.6 MG TABS tablet Take 2 tablets by mouth daily as needed for mild constipation.    . temazepam (RESTORIL) 30 MG capsule Take 1 capsule (30 mg total) by mouth at bedtime as needed for sleep. 30 capsule 5  . temazepam (RESTORIL) 30 MG capsule Take 1 capsule (30 mg total) by mouth at bedtime as needed for sleep. 30 capsule 5  . tiZANidine (ZANAFLEX) 4 MG tablet One tablet twice daily (Patient taking differently: Take 6.5 mg by mouth 2 (two) times daily as needed for muscle spasms. ) 60 tablet 5  . UNABLE TO FIND neuronol capsules  Twice daily     No current facility-administered medications for this visit.     Musculoskeletal: Strength & Muscle Tone: unable to assess due to telemed visit Gait & Station: unable to assess due to telemed visit Patient leans: unable to assess due to telemed visit   Psychiatric Specialty Exam: Review of Systems  There were no vitals taken for this visit.There is no height or weight on file to calculate BMI.  General Appearance: Well Groomed  Eye Contact:  Good  Speech:  Clear and Coherent and Normal Rate  Volume:  Normal  Mood:  Euthymic  Affect:  Congruent  Thought Process:  Goal Directed, Linear and Descriptions of Associations: Intact  Orientation:  Full (Time, Place, and Person)  Thought Content: Logical   Suicidal Thoughts:  No  Homicidal Thoughts:  No  Memory:  Recent;   Good Remote;   Good  Judgement:  Good  Insight:  Good  Psychomotor Activity:  Normal  Concentration:  Concentration: Good and Attention Span: Good  Recall:  Good  Fund of Knowledge: Good  Language: Good  Akathisia:  Negative  Handed:  Right  AIMS (if indicated): not done  Assets:  Communication Skills Desire for Improvement Financial Resources/Insurance Housing  ADL's:  Intact  Cognition: WNL  Sleep:  Fair     Screenings: GAD-7     Virtual [place] Phone Follow Up from 09/27/2017 in [facility] Visit from 08/15/2017 in [facility]  Total GAD-7 Score  6  16    Mini-Mental     Office Visit from 12/03/2018 in [facility]  Total Score (max 30 points )  30    PHQ2-9     Clinical Support from 12/10/2018 in [facility] Office Visit from 12/03/2018 in [facility] Office Visit from 01/18/2018 in [facility] Office Visit from 01/09/2018 in [facility] from 11/29/2017 in [facility]  PHQ-2 Total Score  3  0  0  0  1  PHQ-9 Total Score  _0 Assessment and Plan: 74 year old female with history of MDD, chronic pain, hypertension, osteoarthritis seen for follow-up.  Patient appears to be stable on her current regimen.  1. MDD (major depressive disorder), recurrent, in full remission ([place]) - Continue Duloxetine 40 mg daily.  She is prescribed temazepam 30 mg at bedtime and Xanax 0.5 mg as needed by her PCP.  Continue same medication regimen. Follow up in 3 months.     [place] Crane, MD 03/27/2019, 11:31 AM

## 2019-04-04 DIAGNOSIS — Z23 Encounter for immunization: Secondary | ICD-10-CM | POA: Diagnosis not present

## 2019-04-08 ENCOUNTER — Telehealth: Payer: Self-pay

## 2019-04-08 NOTE — Telephone Encounter (Signed)
Pt LVM that she had some questions.  1- she received the first Covid shot last week, and someone told her she shouldn't have a mammogram  Within 3 weeks of the shot.. Is this true?  2- she has that same knot that has come back up and wants to know if she needs a CT scan.

## 2019-04-08 NOTE — Telephone Encounter (Signed)
Pt advised its been a week since covid shot she should be ok to get mammogram also it has been since June since last appt with Moshe Cipro so made a mychart video visit for 04/11/19 to speak with dr simpson about the knot to see what she preferred

## 2019-04-09 DIAGNOSIS — H35363 Drusen (degenerative) of macula, bilateral: Secondary | ICD-10-CM | POA: Diagnosis not present

## 2019-04-09 DIAGNOSIS — H2512 Age-related nuclear cataract, left eye: Secondary | ICD-10-CM | POA: Diagnosis not present

## 2019-04-09 DIAGNOSIS — H2513 Age-related nuclear cataract, bilateral: Secondary | ICD-10-CM | POA: Diagnosis not present

## 2019-04-09 DIAGNOSIS — H25013 Cortical age-related cataract, bilateral: Secondary | ICD-10-CM | POA: Diagnosis not present

## 2019-04-10 ENCOUNTER — Other Ambulatory Visit: Payer: Self-pay | Admitting: Orthopedic Surgery

## 2019-04-10 DIAGNOSIS — M1712 Unilateral primary osteoarthritis, left knee: Secondary | ICD-10-CM

## 2019-04-11 ENCOUNTER — Telehealth (INDEPENDENT_AMBULATORY_CARE_PROVIDER_SITE_OTHER): Payer: Medicare Other | Admitting: Family Medicine

## 2019-04-11 ENCOUNTER — Other Ambulatory Visit: Payer: Self-pay

## 2019-04-11 ENCOUNTER — Encounter: Payer: Self-pay | Admitting: Family Medicine

## 2019-04-11 VITALS — BP 129/66 | Ht 65.0 in | Wt 110.0 lb

## 2019-04-11 DIAGNOSIS — R52 Pain, unspecified: Secondary | ICD-10-CM | POA: Diagnosis not present

## 2019-04-11 DIAGNOSIS — R0781 Pleurodynia: Secondary | ICD-10-CM

## 2019-04-11 DIAGNOSIS — F331 Major depressive disorder, recurrent, moderate: Secondary | ICD-10-CM

## 2019-04-11 DIAGNOSIS — I10 Essential (primary) hypertension: Secondary | ICD-10-CM

## 2019-04-11 DIAGNOSIS — Z87891 Personal history of nicotine dependence: Secondary | ICD-10-CM

## 2019-04-11 DIAGNOSIS — F418 Other specified anxiety disorders: Secondary | ICD-10-CM | POA: Diagnosis not present

## 2019-04-11 DIAGNOSIS — F5105 Insomnia due to other mental disorder: Secondary | ICD-10-CM

## 2019-04-11 MED ORDER — GABAPENTIN 300 MG PO CAPS: ORAL_CAPSULE | ORAL | 1 refills | Status: DC

## 2019-04-11 MED ORDER — HYDROCHLOROTHIAZIDE 25 MG PO TABS: 25.0000 mg | ORAL_TABLET | Freq: Every day | ORAL | 1 refills | Status: DC

## 2019-04-11 NOTE — Patient Instructions (Addendum)
F/u in October , flu vaccine at visit, call if you need me sooner  Please get a chest X ray tomorrow , because of pain intermittently over left rib with swelling   Please get cBC, and chem 7 and EGFR in early May    Thankful that you are doing well overall.  Please continue to be careful not to fall   Thanks for choosing [facility], we consider it a privelige to serve you.

## 2019-04-11 NOTE — Progress Notes (Signed)
Virtual Visit via Telephone Note  I connected with [name] on 04/11/19 at  3:20 PM EST by telephone and verified that I am speaking with the correct person using two identifiers.  Location: Patient: home Provider: office    I discussed the limitations, risks, security and privacy concerns of performing an evaluation and management service by telephone and the availability of in person appointments. I also discussed with the patient that there may be a patient responsible charge related to this service. The patient expressed understanding and agreed to proceed.   History of Present Illness: F/U chronic problems, medication review, and refill medication when necessary. Review most recent labs and order labs which are due Review preventive health and update with necessary referrals or immunizations as indicated Denies recent fever or chills. Denies sinus pressure, nasal congestion, ear pain or sore throat. Denies chest congestion, productive cough or wheezing.c/o left rib mass under breast, which swells and is palpable with upper body movement, non tender, present for years Denies chest pains, palpitations and leg swelling Denies abdominal pain, nausea, vomiting,diarrhea or constipation.   Deniesc/o chronic  joint pain,  and limitation in mobility. Denies headaches, seizures, numbness, or tingling. Denies uncontrolled  depression, anxiety or insomnia. Denies skin break down or rash.       Observations/Objective: BP 129/66   Ht 5\' 5"  (1.651 m)   Wt 110 lb (49.9 kg)   BMI 18.30 kg/m  Good communication with no confusion and intact memory. Alert and oriented x 3 No signs of respiratory distress during speech    Assessment and Plan:  Essential hypertension Controlled, no change in medication DASH diet and commitment to daily physical activity for a minimum of 30 minutes discussed and encouraged, as a part of hypertension management. The importance of attaining a healthy  weight is also discussed.  BP/Weight 04/11/2019 12/10/2018 12/03/2018 06/19/2018 01/18/2018 01/09/2018 0000000  Systolic BP Q000111Q [id number] [id number] Q000111Q  Diastolic BP 66 74 74 73 50 70 81  Wt. (Lbs) 110 119 119 120 120.12 120.12 119  BMI 18.3 19.8 19.8 19.97 19.99 19.99 19.8  Some encounter information is confidential and restricted. Go to Review Flowsheets activity to see all data.       Insomnia secondary to depression with anxiety Sleep hygiene reviewed and written information offered also. Prescription sent for  medication needed.   MDD (major depressive disorder), recurrent episode, moderate ([place]) Controlled on current medication and no longer seeing therapist  Generalized pain Managed by pain clinic and reports control of symptom    Follow Up Instructions:    I discussed the assessment and treatment plan with the patient. The patient was provided an opportunity to ask questions and all were answered. The patient agreed with the plan and demonstrated an understanding of the instructions.   The patient was advised to call back or seek an in-person evaluation if the symptoms worsen or if the condition fails to improve as anticipated.  I provided 18 minutes of non-face-to-face time during this encounter.   Tula Nakayama, MD

## 2019-04-12 ENCOUNTER — Ambulatory Visit (HOSPITAL_COMMUNITY): Payer: Medicare Other

## 2019-04-12 ENCOUNTER — Other Ambulatory Visit: Payer: Self-pay

## 2019-04-12 ENCOUNTER — Ambulatory Visit (HOSPITAL_COMMUNITY)
Admission: RE | Admit: 2019-04-12 | Discharge: 2019-04-12 | Disposition: A | Discharge status: Home / Self Care | Payer: Medicare Other | Source: Ambulatory Visit | Attending: Family Medicine | Admitting: Family Medicine

## 2019-04-12 DIAGNOSIS — R0781 Pleurodynia: Secondary | ICD-10-CM

## 2019-04-12 DIAGNOSIS — R222 Localized swelling, mass and lump, trunk: Secondary | ICD-10-CM | POA: Diagnosis not present

## 2019-04-12 DIAGNOSIS — Z1231 Encounter for screening mammogram for malignant neoplasm of breast: Secondary | ICD-10-CM | POA: Insufficient documentation

## 2019-04-12 DIAGNOSIS — R0789 Other chest pain: Secondary | ICD-10-CM | POA: Diagnosis not present

## 2019-04-13 ENCOUNTER — Encounter: Payer: Self-pay | Admitting: Family Medicine

## 2019-04-13 MED ORDER — TEMAZEPAM 30 MG PO CAPS: 30.0000 mg | ORAL_CAPSULE | Freq: Every evening | ORAL | 5 refills | Status: DC | PRN

## 2019-04-13 NOTE — Assessment & Plan Note (Signed)
Controlled, no change in medication DASH diet and commitment to daily physical activity for a minimum of 30 minutes discussed and encouraged, as a part of hypertension management. The importance of attaining a healthy weight is also discussed.  BP/Weight 04/11/2019 12/10/2018 12/03/2018 06/19/2018 01/18/2018 01/09/2018 0000000  Systolic BP Q000111Q [id number] [id number] A999333 Q000111Q  Diastolic BP 66 74 74 73 50 70 81  Wt. (Lbs) 110 119 119 120 120.12 120.12 119  BMI 18.3 19.8 19.8 19.97 19.99 19.99 19.8  Some encounter information is confidential and restricted. Go to Review Flowsheets activity to see all data.

## 2019-04-13 NOTE — Assessment & Plan Note (Signed)
Sleep hygiene reviewed and written information offered also. Prescription sent for  medication needed.  

## 2019-04-13 NOTE — Assessment & Plan Note (Signed)
Managed by pain clinic and reports control of symptom

## 2019-04-13 NOTE — Assessment & Plan Note (Signed)
Controlled on current medication and no longer seeing therapist

## 2019-05-01 DIAGNOSIS — H2512 Age-related nuclear cataract, left eye: Secondary | ICD-10-CM | POA: Diagnosis not present

## 2019-05-01 DIAGNOSIS — H25812 Combined forms of age-related cataract, left eye: Secondary | ICD-10-CM | POA: Diagnosis not present

## 2019-05-03 DIAGNOSIS — Z23 Encounter for immunization: Secondary | ICD-10-CM | POA: Diagnosis not present

## 2019-05-07 DIAGNOSIS — H25011 Cortical age-related cataract, right eye: Secondary | ICD-10-CM | POA: Diagnosis not present

## 2019-05-07 DIAGNOSIS — H2511 Age-related nuclear cataract, right eye: Secondary | ICD-10-CM | POA: Diagnosis not present

## 2019-05-14 ENCOUNTER — Ambulatory Visit: Payer: Medicare Other | Admitting: Gastroenterology

## 2019-05-14 NOTE — Progress Notes (Deleted)
Primary Care Physician: [name], MD  Primary Gastroenterologist:  Garfield Cornea, MD   No chief complaint on file.   HPI: Renee Waters is a 74 y.o. female here for follow-up of GERD and Barrett's.  We last saw her at time of virtual visit back in October.  She was due for surveillance EGD at the time but she wanted to postpone in the setting of the pandemic.  Last upper endoscopy August 2017.  She is due for colonoscopy in August 2022 for history of tubular adenomas.  Current Outpatient Medications  Medication Sig Dispense Refill  . acyclovir (ZOVIRAX) 800 MG tablet TAKE 1 TABLET BY MOUTH 4 TIMES DAILY AS NEEDED FOR  BREAKOUTS 30 tablet 3  . ALPRAZolam (XANAX) 0.5 MG tablet Take 1 tablet (0.5 mg total) by mouth at bedtime. 30 tablet 5  . DULoxetine (CYMBALTA) 20 MG capsule Take 2 capsules (40 mg total) by mouth daily. 180 capsule 1  . EPINEPHRINE 0.3 mg/0.3 mL IJ SOAJ injection INJECT 0.3 MLS INTO MUSCLE ONCE AS NEEDED 1 Device 2  . gabapentin (NEURONTIN) 300 MG capsule TAKE 1 CAPSULE BY MOUTH ONCE DAILY FOR  CHRONIC  PAIN 90 capsule 1  . hydrochlorothiazide (HYDRODIURIL) 25 MG tablet Take 1 tablet (25 mg total) by mouth daily. 90 tablet 1  . meloxicam (MOBIC) 7.5 MG tablet Take 1 tablet (7.5 mg total) by mouth daily. 60 tablet 2  . Misc Natural Products (TURMERIC CURCUMIN) CAPS Take by mouth. Once daily    . Multiple Vitamins-Minerals (MULTIVITAMINS THER. W/MINERALS) TABS Take 1 tablet by mouth daily.     . Oxycodone HCl 10 MG TABS Take 1 tablet (10 mg total) by mouth 4 (four) times daily. (Patient taking differently: Take 10 mg by mouth every 6 (six) hours as needed (pain). ) 56 tablet 0  . pantoprazole (PROTONIX) 40 MG tablet TAKE 1 TABLET BY MOUTH ONCE DAILY BEFORE BREAKFAST 90 tablet 3  . senna (SENOKOT) 8.6 MG TABS tablet Take 2 tablets by mouth daily as needed for mild constipation.    . temazepam (RESTORIL) 30 MG capsule Take 1 capsule (30 mg total) by mouth at  bedtime as needed for sleep. 30 capsule 5  . temazepam (RESTORIL) 30 MG capsule Take 1 capsule (30 mg total) by mouth at bedtime as needed for sleep. 30 capsule 5  . temazepam (RESTORIL) 30 MG capsule Take 1 capsule (30 mg total) by mouth at bedtime as needed for sleep. 30 capsule 5  . tiZANidine (ZANAFLEX) 4 MG tablet One tablet twice daily (Patient taking differently: Take 6.5 mg by mouth 2 (two) times daily as needed for muscle spasms. ) 60 tablet 5  . UNABLE TO FIND neuronol capsules  Twice daily     No current facility-administered medications for this visit.    Allergies as of 05/14/2019 - Review Complete 04/13/2019  Allergen Reaction Noted  . Bee venom Swelling 06/12/2012  . Tyloxapol  12/10/2018  . Oxycodone-acetaminophen Rash 01/26/2015    ROS:  General: Negative for anorexia, weight loss, fever, chills, fatigue, weakness. ENT: Negative for hoarseness, difficulty swallowing , nasal congestion. CV: Negative for chest pain, angina, palpitations, dyspnea on exertion, peripheral edema.  Respiratory: Negative for dyspnea at rest, dyspnea on exertion, cough, sputum, wheezing.  GI: See history of present illness. GU:  Negative for dysuria, hematuria, urinary incontinence, urinary frequency, nocturnal urination.  Endo: Negative for unusual weight change.    Physical Examination:   There were no vitals  taken for this visit.  General: Well-nourished, well-developed in no acute distress.  Eyes: No icterus. Mouth: Oropharyngeal mucosa moist and pink , no lesions erythema or exudate. Lungs: Clear to auscultation bilaterally.  Heart: Regular rate and rhythm, no murmurs rubs or gallops.  Abdomen: Bowel sounds are normal, nontender, nondistended, no hepatosplenomegaly or masses, no abdominal bruits or hernia , no rebound or guarding.   Extremities: No lower extremity edema. No clubbing or deformities. Neuro: Alert and oriented x 4   Skin: Warm and dry, no jaundice.   Psych: Alert and  cooperative, normal mood and affect.  Labs:  Lab Results  Component Value Date   WBC 3.1 (L) 11/28/2018   HGB 12.1 11/28/2018   HCT 38.3 11/28/2018   MCV 96 11/28/2018   PLT 222 11/28/2018   Lab Results  Component Value Date   CREATININE 1.19 (H) 11/28/2018   BUN 16 11/28/2018   NA 141 11/28/2018   K 3.6 11/28/2018   CL 103 11/28/2018   CO2 25 11/28/2018   Lab Results  Component Value Date   ALT 9 11/28/2018   AST 16 11/28/2018   ALKPHOS 98 11/28/2018   BILITOT 0.5 11/28/2018   Lab Results  Component Value Date   TSH 3.270 11/28/2018     Imaging Studies: No results found.

## 2019-05-22 ENCOUNTER — Ambulatory Visit: Payer: Medicare Other | Admitting: Gastroenterology

## 2019-05-22 DIAGNOSIS — H25011 Cortical age-related cataract, right eye: Secondary | ICD-10-CM | POA: Diagnosis not present

## 2019-05-22 DIAGNOSIS — H2511 Age-related nuclear cataract, right eye: Secondary | ICD-10-CM | POA: Diagnosis not present

## 2019-05-22 DIAGNOSIS — H25811 Combined forms of age-related cataract, right eye: Secondary | ICD-10-CM | POA: Diagnosis not present

## 2019-06-03 ENCOUNTER — Other Ambulatory Visit: Payer: Self-pay

## 2019-06-03 ENCOUNTER — Encounter: Payer: Self-pay | Admitting: Gastroenterology

## 2019-06-03 ENCOUNTER — Encounter: Payer: Self-pay | Admitting: *Deleted

## 2019-06-03 ENCOUNTER — Ambulatory Visit (INDEPENDENT_AMBULATORY_CARE_PROVIDER_SITE_OTHER): Payer: Medicare Other | Admitting: Gastroenterology

## 2019-06-03 VITALS — BP 146/92 | HR 92 | Temp 97.1°F | Ht 65.0 in | Wt 120.8 lb

## 2019-06-03 DIAGNOSIS — K2100 Gastro-esophageal reflux disease with esophagitis, without bleeding: Secondary | ICD-10-CM | POA: Diagnosis not present

## 2019-06-03 DIAGNOSIS — K5903 Drug induced constipation: Secondary | ICD-10-CM | POA: Diagnosis not present

## 2019-06-03 DIAGNOSIS — K2270 Barrett's esophagus without dysplasia: Secondary | ICD-10-CM | POA: Diagnosis not present

## 2019-06-03 MED ORDER — LINACLOTIDE 290 MCG PO CAPS: 290.0000 ug | ORAL_CAPSULE | Freq: Every day | ORAL | 5 refills | Status: DC

## 2019-06-03 NOTE — Assessment & Plan Note (Signed)
Increased difficulty with management.  We will try to simplify her regimen.  Previously did well on Linzess but unaffordable a couple years ago.  We will try again.  Samples of Linzess 290 mcg provided.  Prescription sent to pharmacy.  Hold on senna products and magnesium citrate.  May utilize MiraLAX daily if she needs it.  She will let us know if she is unable to afford Linzess or if it is not effective.

## 2019-06-03 NOTE — Patient Instructions (Addendum)
1. Start Linzess 284mcg daily before breakfast. Samples provided.  2. Stop Senna products, Magnesium while on Linzess.  3. Call if you are not able to get Linzess or if it is not working well. 4. Upper endoscopy as scheduled. See separate instructions.

## 2019-06-03 NOTE — Progress Notes (Signed)
Primary Care Physician: [name], MD  Primary Gastroenterologist:  Garfield Cornea, MD   Chief Complaint  Patient presents with  . Gastroesophageal Reflux    f/u doing fine  . barrett's    HPI: Renee Waters is a 74 y.o. female with history of GERD, Barrett's esophagus, opioid-induced constipation here for follow-up.  She is overdue for Barrett's surveillance via EGD.  Her next colonoscopy is due in August 2022 for history of tubular adenomas.  Previously postpone EGD due to Covid pandemic.  Having issues with bowel movements. Some days BMs ok and easy but a lot of times has hard time going. Straining. Having to take multiple laxatives. Alternates between taking several senna at bedtime along with smooth move, magnesium citrate at times, MiraLAX does not seem to help much.  In the past she has failed Amitiza.  In the past Linzess has been helpful but unaffordable. States Movantik she did not tolerate.  No blood in the stool or melena.  Heartburn well controlled.  No dysphagia.  Weight is stable.  She has received both her Covid vaccines.   She has had issues with feeling some discomfort in the left lower chest.Feels it knot up at times. Seems to be related to movement. PCP obtained chest xray and mammogram which were normal. Area of concern is over left anterior chest just below the breast.  Current Outpatient Medications  Medication Sig Dispense Refill  . acyclovir (ZOVIRAX) 800 MG tablet TAKE 1 TABLET BY MOUTH 4 TIMES DAILY AS NEEDED FOR  BREAKOUTS 30 tablet 3  . ALPRAZolam (XANAX) 0.5 MG tablet Take 1 tablet (0.5 mg total) by mouth at bedtime. 30 tablet 5  . DULoxetine (CYMBALTA) 20 MG capsule Take 2 capsules (40 mg total) by mouth daily. 180 capsule 1  . EPINEPHRINE 0.3 mg/0.3 mL IJ SOAJ injection INJECT 0.3 MLS INTO MUSCLE ONCE AS NEEDED 1 Device 2  . gabapentin (NEURONTIN) 300 MG capsule TAKE 1 CAPSULE BY MOUTH ONCE DAILY FOR  CHRONIC  PAIN 90 capsule 1  .  hydrochlorothiazide (HYDRODIURIL) 25 MG tablet Take 1 tablet (25 mg total) by mouth daily. 90 tablet 1  . meloxicam (MOBIC) 7.5 MG tablet Take 1 tablet (7.5 mg total) by mouth daily. 60 tablet 2  . Multiple Vitamins-Minerals (MULTIVITAMINS THER. W/MINERALS) TABS Take 1 tablet by mouth daily.     . Oxycodone HCl 10 MG TABS Take 1 tablet (10 mg total) by mouth 4 (four) times daily. (Patient taking differently: Take 10 mg by mouth every 6 (six) hours as needed (pain). ) 56 tablet 0  . pantoprazole (PROTONIX) 40 MG tablet TAKE 1 TABLET BY MOUTH ONCE DAILY BEFORE BREAKFAST 90 tablet 3  . senna (SENOKOT) 8.6 MG TABS tablet Take 2 tablets by mouth daily as needed for mild constipation.    . temazepam (RESTORIL) 30 MG capsule Take 1 capsule (30 mg total) by mouth at bedtime as needed for sleep. 30 capsule 5  . tiZANidine (ZANAFLEX) 4 MG tablet One tablet twice daily (Patient taking differently: Take 6.5 mg by mouth 2 (two) times daily as needed for muscle spasms. ) 60 tablet 5   No current facility-administered medications for this visit.    Allergies as of 06/03/2019 - Review Complete 06/03/2019  Allergen Reaction Noted  . Bee venom Swelling 06/12/2012  . Tyloxapol  12/10/2018  . Oxycodone-acetaminophen Rash 01/26/2015   Past Medical History:  Diagnosis Date  . Acute cholangitis   . Allergy   .  Anemia   . Anxiety   . Barrett's esophagus   . Cataract   . Chronic back pain   . Chronic neck pain   . Depression   . Genital herpes   . GERD (gastroesophageal reflux disease)   . History of blood transfusion   . Hypertension   . Insomnia   . Lupus (systemic lupus erythematosus) ([place])   . Neuromuscular disorder ([place])   . Osteoarthritis   . S/P colonoscopy June 2005   normal, no polyps  . S/P endoscopy June 2005, Oct 2009   2005: short-segment Barrett's, 2009: short-segment Barrett's  . UTI (lower urinary tract infection) 11/2012   Past Surgical History:  Procedure Laterality Date  .  ABDOMINAL HYSTERECTOMY    . BACK SURGERY    . BIOPSY N/A 03/20/2014   Procedure: BIOPSY;  Surgeon: Daneil Dolin, MD;  Location: [facility];  Service: Endoscopy;  Laterality: N/A;  . BIOPSY  09/14/2015   Procedure: BIOPSY;  Surgeon: Daneil Dolin, MD;  Location: [facility];  Service: Endoscopy;;  esophageal and gastric  . CARPAL TUNNEL RELEASE Left 2013  . cervical disectomy  2002  . CHOLECYSTECTOMY     with lysis of adhesions for sbo; "ruptured gallbladder".  . COLONOSCOPY  11/09/2011   RMR: Melanosis coli  . COLONOSCOPY WITH PROPOFOL N/A 09/14/2015   Dr. Gala Romney: diverticulosis, 34mm TA removed. next TCS 09/2020.   . DENTAL SURGERY  11/2015   multiple tooth extraction  . ESOPHAGOGASTRODUODENOSCOPY  11/29/2007   salmon-colored  tongue   longest stable at  3 cm, distal esophagus as described previously status post biopsy/ Hiatal hernia, otherwise normal stomach D1 and D2  . ESOPHAGOGASTRODUODENOSCOPY  01/06/11   short segment Barrett's esophagus s/p bx/Hiatal hernia  . ESOPHAGOGASTRODUODENOSCOPY (EGD) WITH PROPOFOL N/A 03/20/2014   [id number] distal esophagus short segment barrett's, bx with no dysplasia. next egd in 03/2017  . ESOPHAGOGASTRODUODENOSCOPY (EGD) WITH PROPOFOL N/A 09/14/2015   Dr. Gala Romney: Barrett's without dysplasia, gastritis benign bx, hiatal hernia. next EGD 09/2018.  Marland Kitchen HERNIA REPAIR Right 07/2010   Dr. Zada Girt  . JOINT REPLACEMENT    . LAPAROSCOPIC CHOLECYSTECTOMY  2017   at [facility]  . POLYPECTOMY  09/14/2015   Procedure: POLYPECTOMY;  Surgeon: Daneil Dolin, MD;  Location: [facility];  Service: Endoscopy;;  ascending colon  . right hip replacement  07/2010   went back in sept 2012 to fix  . SHOULDER ARTHROSCOPY  2008   left  . TOTAL HIP REVISION Right 12/17/2012   Procedure: RIGHT TOTAL HIP REVISION;  Surgeon: Mauri Pole, MD;  Location: [facility];  Service: Orthopedics;  Laterality: Right;  . WRIST SURGERY Right 2011   open reduction right wrist.   Family History   Problem Relation Age of Onset  . Hypertension Mother   . Stroke Mother   . Colon cancer Neg Hx   . Anesthesia problems Neg Hx   . Hypotension Neg Hx   . Malignant hyperthermia Neg Hx   . Pseudochol deficiency Neg Hx   . Gastric cancer Neg Hx   . Esophageal cancer Neg Hx    Social History   Tobacco Use  . Smoking status: Former Smoker    Packs/day: 0.25    Years: 25.00    Pack years: 6.25    Types: Cigarettes    Quit date: 02/07/2003    Years since quitting: 16.3  . Smokeless tobacco: Never Used  . Tobacco comment: quit in 2004  Substance Use  Topics  . Alcohol use: No  . Drug use: No    ROS:  General: Negative for anorexia, weight loss, fever, chills, fatigue, weakness. ENT: Negative for hoarseness, difficulty swallowing , nasal congestion. CV: Negative for chest pain, angina, palpitations, dyspnea on exertion, peripheral edema.  Respiratory: Negative for dyspnea at rest, dyspnea on exertion, cough, sputum, wheezing.  GI: See history of present illness. GU:  Negative for dysuria, hematuria, urinary incontinence, urinary frequency, nocturnal urination.  Endo: Negative for unusual weight change.    Physical Examination:   BP (!) 177/95   Pulse 92   Temp (!) 97.1 F (36.2 C) (Oral)   Ht 5\' 5"  (1.651 m)   Wt 120 lb 12.8 oz (54.8 kg)   BMI 20.10 kg/m   General: Well-nourished, well-developed in no acute distress.  Eyes: No icterus. Mouth: masked Lungs: Clear to auscultation bilaterally.  Heart: Regular rate and rhythm, no murmurs rubs or gallops.  Abdomen: Bowel sounds are normal, nontender, nondistended, no hepatosplenomegaly or masses, no abdominal bruits or hernia , no rebound or guarding.  Pain with palpation over the left lower anterior chest wall just below the inframammary folds. No palpable mass.  Extremities: No lower extremity edema. No clubbing or deformities. Neuro: Alert and oriented x 4   Skin: Warm and dry, no jaundice.   Psych: Alert and  cooperative, normal mood and affect.  Labs:  Lab Results  Component Value Date   TSH 3.270 11/28/2018   Lab Results  Component Value Date   WBC 3.1 (L) 11/28/2018   HGB 12.1 11/28/2018   HCT 38.3 11/28/2018   MCV 96 11/28/2018   PLT 222 11/28/2018   Lab Results  Component Value Date   ALT 9 11/28/2018   AST 16 11/28/2018   ALKPHOS 98 11/28/2018   BILITOT 0.5 11/28/2018       Imaging Studies: No results found.

## 2019-06-03 NOTE — Assessment & Plan Note (Signed)
Reflux is well controlled.  She is due for surveillance endoscopy for history of Barrett's.  Plan for deep sedation due to polypharmacy. I have discussed the risks, alternatives, benefits with regards to but not limited to the risk of reaction to medication, bleeding, infection, perforation and the patient is agreeable to proceed. Written consent to be obtained.  Continue pantoprazole 40 mg daily before breakfast.

## 2019-06-04 ENCOUNTER — Encounter: Payer: Self-pay | Admitting: *Deleted

## 2019-06-04 ENCOUNTER — Ambulatory Visit: Payer: Medicare Other | Admitting: Family Medicine
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
## 2019-06-24 ENCOUNTER — Telehealth (INDEPENDENT_AMBULATORY_CARE_PROVIDER_SITE_OTHER): Payer: Medicare Other | Admitting: Psychiatry

## 2019-06-24 ENCOUNTER — Other Ambulatory Visit: Payer: Self-pay

## 2019-06-24 ENCOUNTER — Encounter (HOSPITAL_COMMUNITY): Payer: Self-pay | Admitting: Psychiatry

## 2019-06-24 ENCOUNTER — Other Ambulatory Visit: Payer: Self-pay | Admitting: Family Medicine

## 2019-06-24 DIAGNOSIS — F330 Major depressive disorder, recurrent, mild: Secondary | ICD-10-CM

## 2019-07-02 ENCOUNTER — Other Ambulatory Visit: Payer: Self-pay | Admitting: Family Medicine

## 2019-07-02 DIAGNOSIS — I10 Essential (primary) hypertension: Secondary | ICD-10-CM | POA: Diagnosis not present

## 2019-07-03 LAB — BASIC METABOLIC PANEL
BUN/Creatinine Ratio: 15 (ref 12–28)
BUN: 14 mg/dL (ref 8–27)
CO2: 23 mmol/L (ref 20–29)
Calcium: 9.2 mg/dL (ref 8.7–10.3)
Chloride: 106 mmol/L (ref 96–106)
Creatinine, Ser: 0.93 mg/dL (ref 0.57–1.00)
GFR calc Af Amer: 71 mL/min/{1.73_m2} (ref >59)
GFR calc non Af Amer: 61 mL/min/{1.73_m2} (ref >59)
Glucose: 74 mg/dL (ref 65–99)
Potassium: 3.6 mmol/L (ref 3.5–5.2)
Sodium: 144 mmol/L (ref 134–144)

## 2019-07-03 LAB — CBC WITH DIFFERENTIAL/PLATELET
Basophils Absolute: 0 10*3/uL (ref 0.0–0.2)
Basos: 1 %
EOS (ABSOLUTE): 0.1 10*3/uL (ref 0.0–0.4)
Eos: 2 %
Hematocrit: 35.2 % (ref 34.0–46.6)
Hemoglobin: 11.5 g/dL (ref 11.1–15.9)
Immature Grans (Abs): 0 10*3/uL (ref 0.0–0.1)
Immature Granulocytes: 0 %
Lymphocytes Absolute: 1.1 10*3/uL (ref 0.7–3.1)
Lymphs: 25 %
MCH: 31.6 pg (ref 26.6–33.0)
MCHC: 32.7 g/dL (ref 31.5–35.7)
MCV: 97 fL (ref 79–97)
Monocytes Absolute: 0.4 10*3/uL (ref 0.1–0.9)
Monocytes: 10 %
Neutrophils Absolute: 2.7 10*3/uL (ref 1.4–7.0)
Neutrophils: 62 %
Platelets: 272 10*3/uL (ref 150–450)
RBC: 3.64 x10E6/uL — ABNORMAL LOW (ref 3.77–5.28)
RDW: 12.8 % (ref 11.7–15.4)
WBC: 4.3 10*3/uL (ref 3.4–10.8)

## 2019-07-03 LAB — SPECIMEN STATUS REPORT

## 2019-07-09 ENCOUNTER — Telehealth: Payer: Self-pay

## 2019-07-09 NOTE — Telephone Encounter (Signed)
Pharmacy faxed request for refill on Alprazolam 0.5mg .

## 2019-07-10 ENCOUNTER — Other Ambulatory Visit: Payer: Self-pay | Admitting: Family Medicine

## 2019-07-10 DIAGNOSIS — Z79891 Long term (current) use of opiate analgesic: Secondary | ICD-10-CM | POA: Diagnosis not present

## 2019-07-10 DIAGNOSIS — M47812 Spondylosis without myelopathy or radiculopathy, cervical region: Secondary | ICD-10-CM | POA: Diagnosis not present

## 2019-07-10 DIAGNOSIS — G894 Chronic pain syndrome: Secondary | ICD-10-CM | POA: Diagnosis not present

## 2019-07-10 DIAGNOSIS — M47816 Spondylosis without myelopathy or radiculopathy, lumbar region: Secondary | ICD-10-CM | POA: Diagnosis not present

## 2019-07-10 MED ORDER — TEMAZEPAM 30 MG PO CAPS
30.0000 mg | ORAL_CAPSULE | Freq: Every day | ORAL | 0 refills | Status: DC
Start: 2019-07-10 — End: 2020-02-10

## 2019-07-10 MED ORDER — ALPRAZOLAM 0.5 MG PO TABS
0.5000 mg | ORAL_TABLET | Freq: Every day | ORAL | 0 refills | Status: DC
Start: 2019-07-10 — End: 2019-08-08

## 2019-07-10 NOTE — Telephone Encounter (Signed)
Pls call and advise pt one month of xanax and restoril are prescribed. She needs an OV scheduled within that time with me and a UDS to screen for restoril  and xanax to be completed 1 week before the visit

## 2019-07-10 NOTE — Telephone Encounter (Signed)
Can you schedule her an appt for ov in the next 3 weeks and let her know she will need to come in to leave a urine for uds 1 week before the scheduled appt. Thanks

## 2019-07-15 NOTE — Telephone Encounter (Signed)
Called pt, made appointment and advise to come in one week prior

## 2019-07-19 NOTE — Patient Instructions (Signed)
Dezyrae Kensinger  07/19/2019     @PREFPERIOPPHARMACY @   Your procedure is scheduled on  07/23/2019 .  Report to [facility] at  1330  (1:30)  P.M.  Call this number if you have problems the morning of surgery:  [phone]   Remember: Follow the diet instructions given to you by Dr Roseanne Kaufman office.                       Take these medicines the morning of surgery with A SIP OF WATER  Cymbalta, gabapentin, mobic or zanaflex (if needed), oxycodone(if needed), protonix.    Do not wear jewelry, make-up or nail polish.  Do not wear lotions, powders, or perfumes. Please wear deodorant and brush your teeth.  Do not shave 48 hours prior to surgery.  Men may shave face and neck.  Do not bring valuables to the hospital.  [facility] is not responsible for any belongings or valuables.  Contacts, dentures or bridgework may not be worn into surgery.  Leave your suitcase in the car.  After surgery it may be brought to your room.  For patients admitted to the hospital, discharge time will be determined by your treatment team.  Patients discharged the day of surgery will not be allowed to drive home.   Name and phone number of your driver:   family Special instructions:  DO NOT smoke the morning of your procedure.  Please read over the following fact sheets that you were given. Anesthesia Post-op Instructions and Care and Recovery After Surgery       Upper Endoscopy, Adult, Care After This sheet gives you information about how to care for yourself after your procedure. Your health care provider may also give you more specific instructions. If you have problems or questions, contact your health care provider. What can I expect after the procedure? After the procedure, it is common to have:  A sore throat.  Mild stomach pain or discomfort.  Bloating.  Nausea. Follow these instructions at home:   Follow instructions from your health care provider about what to eat or drink  after your procedure.  Return to your normal activities as told by your health care provider. Ask your health care provider what activities are safe for you.  Take over-the-counter and prescription medicines only as told by your health care provider.  Do not drive for 24 hours if you were given a sedative during your procedure.  Keep all follow-up visits as told by your health care provider. This is important. Contact a health care provider if you have:  A sore throat that lasts longer than one day.  Trouble swallowing. Get help right away if:  You vomit blood or your vomit looks like coffee grounds.  You have: ? A fever. ? Bloody, black, or tarry stools. ? A severe sore throat or you cannot swallow. ? Difficulty breathing. ? Severe pain in your chest or abdomen. Summary  After the procedure, it is common to have a sore throat, mild stomach discomfort, bloating, and nausea.  Do not drive for 24 hours if you were given a sedative during the procedure.  Follow instructions from your health care provider about what to eat or drink after your procedure.  Return to your normal activities as told by your health care provider. This information is not intended to replace advice given to you by your health care provider. Make sure you discuss any questions you have  with your health care provider. Document Revised: 07/18/2017 Document Reviewed: 06/26/2017 Elsevier Patient Education  2020 [place] After These instructions provide you with information about caring for yourself after your procedure. Your health care provider may also give you more specific instructions. Your treatment has been planned according to current medical practices, but problems sometimes occur. Call your health care provider if you have any problems or questions after your procedure. What can I expect after the procedure? After your procedure, you may:  Feel sleepy for  several hours.  Feel clumsy and have poor balance for several hours.  Feel forgetful about what happened after the procedure.  Have poor judgment for several hours.  Feel nauseous or vomit.  Have a sore throat if you had a breathing tube during the procedure. Follow these instructions at home: For at least 24 hours after the procedure:      Have a responsible adult stay with you. It is important to have someone help care for you until you are awake and alert.  Rest as needed.  Do not: ? Participate in activities in which you could fall or become injured. ? Drive. ? Use heavy machinery. ? Drink alcohol. ? Take sleeping pills or medicines that cause drowsiness. ? Make important decisions or sign legal documents. ? Take care of children on your own. Eating and drinking  Follow the diet that is recommended by your health care provider.  If you vomit, drink water, juice, or soup when you can drink without vomiting.  Make sure you have little or no nausea before eating solid foods. General instructions  Take over-the-counter and prescription medicines only as told by your health care provider.  If you have sleep apnea, surgery and certain medicines can increase your risk for breathing problems. Follow instructions from your health care provider about wearing your sleep device: ? Anytime you are sleeping, including during daytime naps. ? While taking prescription pain medicines, sleeping medicines, or medicines that make you drowsy.  If you smoke, do not smoke without supervision.  Keep all follow-up visits as told by your health care provider. This is important. Contact a health care provider if:  You keep feeling nauseous or you keep vomiting.  You feel light-headed.  You develop a rash.  You have a fever. Get help right away if:  You have trouble breathing. Summary  For several hours after your procedure, you may feel sleepy and have poor judgment.  Have a  responsible adult stay with you for at least 24 hours or until you are awake and alert. This information is not intended to replace advice given to you by your health care provider. Make sure you discuss any questions you have with your health care provider. Document Revised: 04/24/2017 Document Reviewed: 05/17/2015 Elsevier Patient Education  [place].

## 2019-07-22 ENCOUNTER — Other Ambulatory Visit (HOSPITAL_COMMUNITY)
Admission: RE | Admit: 2019-07-22 | Discharge: 2019-07-22 | Disposition: A | Discharge status: Home / Self Care | Payer: Medicare Other | Source: Ambulatory Visit | Attending: Internal Medicine | Admitting: Internal Medicine

## 2019-07-22 ENCOUNTER — Other Ambulatory Visit (HOSPITAL_COMMUNITY): Payer: Medicare Other

## 2019-07-22 ENCOUNTER — Encounter (HOSPITAL_COMMUNITY): Payer: Self-pay

## 2019-07-22 ENCOUNTER — Other Ambulatory Visit: Payer: Self-pay

## 2019-07-22 ENCOUNTER — Encounter (HOSPITAL_COMMUNITY)
Admission: RE | Admit: 2019-07-22 | Discharge: 2019-07-22 | Disposition: A | Discharge status: Home / Self Care | Payer: Medicare Other | Source: Ambulatory Visit | Attending: Internal Medicine | Admitting: Internal Medicine

## 2019-07-22 DIAGNOSIS — Z20822 Contact with and (suspected) exposure to covid-19: Secondary | ICD-10-CM | POA: Diagnosis not present

## 2019-07-22 DIAGNOSIS — Z01812 Encounter for preprocedural laboratory examination: Secondary | ICD-10-CM | POA: Diagnosis present

## 2019-07-22 LAB — SARS CORONAVIRUS 2 (TAT 6-24 HRS): SARS Coronavirus 2: NEGATIVE

## 2019-07-23 ENCOUNTER — Ambulatory Visit (HOSPITAL_COMMUNITY): Payer: Medicare Other | Admitting: Anesthesiology

## 2019-07-23 ENCOUNTER — Ambulatory Visit (HOSPITAL_COMMUNITY)
Admission: RE | Admit: 2019-07-23 | Discharge: 2019-07-23 | Disposition: A | Discharge status: Home / Self Care | Payer: Medicare Other | Attending: Internal Medicine | Admitting: Internal Medicine

## 2019-07-23 ENCOUNTER — Encounter (HOSPITAL_COMMUNITY): Payer: Self-pay | Admitting: Internal Medicine

## 2019-07-23 ENCOUNTER — Encounter (HOSPITAL_COMMUNITY)
Admission: RE | Disposition: A | Discharge status: Home / Self Care | Payer: Self-pay | Source: Home / Self Care | Attending: Internal Medicine

## 2019-07-23 DIAGNOSIS — L930 Discoid lupus erythematosus: Secondary | ICD-10-CM | POA: Diagnosis not present

## 2019-07-23 DIAGNOSIS — F419 Anxiety disorder, unspecified: Secondary | ICD-10-CM | POA: Insufficient documentation

## 2019-07-23 DIAGNOSIS — M549 Dorsalgia, unspecified: Secondary | ICD-10-CM | POA: Diagnosis not present

## 2019-07-23 DIAGNOSIS — Z79899 Other long term (current) drug therapy: Secondary | ICD-10-CM | POA: Diagnosis not present

## 2019-07-23 DIAGNOSIS — F329 Major depressive disorder, single episode, unspecified: Secondary | ICD-10-CM | POA: Diagnosis not present

## 2019-07-23 DIAGNOSIS — I10 Essential (primary) hypertension: Secondary | ICD-10-CM | POA: Insufficient documentation

## 2019-07-23 DIAGNOSIS — K219 Gastro-esophageal reflux disease without esophagitis: Secondary | ICD-10-CM | POA: Insufficient documentation

## 2019-07-23 DIAGNOSIS — K31819 Angiodysplasia of stomach and duodenum without bleeding: Secondary | ICD-10-CM | POA: Diagnosis not present

## 2019-07-23 DIAGNOSIS — G8929 Other chronic pain: Secondary | ICD-10-CM | POA: Insufficient documentation

## 2019-07-23 DIAGNOSIS — K2270 Barrett's esophagus without dysplasia: Secondary | ICD-10-CM

## 2019-07-23 DIAGNOSIS — K449 Diaphragmatic hernia without obstruction or gangrene: Secondary | ICD-10-CM | POA: Diagnosis not present

## 2019-07-23 DIAGNOSIS — Z87891 Personal history of nicotine dependence: Secondary | ICD-10-CM | POA: Insufficient documentation

## 2019-07-23 DIAGNOSIS — D649 Anemia, unspecified: Secondary | ICD-10-CM | POA: Insufficient documentation

## 2019-07-23 DIAGNOSIS — M542 Cervicalgia: Secondary | ICD-10-CM | POA: Diagnosis not present

## 2019-07-23 HISTORY — PX: BIOPSY: SHX5522

## 2019-07-23 HISTORY — PX: ESOPHAGOGASTRODUODENOSCOPY (EGD) WITH PROPOFOL: SHX5813

## 2019-07-23 SURGERY — ESOPHAGOGASTRODUODENOSCOPY (EGD) WITH PROPOFOL
Anesthesia: General

## 2019-07-23 MED ORDER — LACTATED RINGERS IV SOLN
INTRAVENOUS | Status: DC
  Administered 2019-07-23: 1000 mL via INTRAVENOUS

## 2019-07-23 MED ORDER — CHLORHEXIDINE GLUCONATE CLOTH 2 % EX PADS: 6.0000 | MEDICATED_PAD | Freq: Once | CUTANEOUS | Status: DC

## 2019-07-23 MED ORDER — GLYCOPYRROLATE 0.2 MG/ML IJ SOLN
INTRAMUSCULAR | Status: AC
  Filled 2019-07-23: qty 1

## 2019-07-23 MED ORDER — PROPOFOL 500 MG/50ML IV EMUL
INTRAVENOUS | Status: DC | PRN
  Administered 2019-07-23: 150 ug/kg/min via INTRAVENOUS

## 2019-07-23 MED ORDER — LIDOCAINE VISCOUS HCL 2 % MT SOLN
15.0000 mL | Freq: Once | OROMUCOSAL | Status: AC
  Administered 2019-07-23: 15 mL via OROMUCOSAL

## 2019-07-23 MED ORDER — PROPOFOL 10 MG/ML IV BOLUS
INTRAVENOUS | Status: DC | PRN
  Administered 2019-07-23: 20 mg via INTRAVENOUS
  Administered 2019-07-23: 30 mg via INTRAVENOUS
  Administered 2019-07-23: 50 mg via INTRAVENOUS

## 2019-07-23 MED ORDER — LIDOCAINE VISCOUS HCL 2 % MT SOLN
OROMUCOSAL | Status: AC
  Filled 2019-07-23: qty 15

## 2019-07-23 MED ORDER — PROPOFOL 10 MG/ML IV BOLUS
INTRAVENOUS | Status: AC
  Filled 2019-07-23: qty 20

## 2019-07-23 MED ORDER — GLYCOPYRROLATE 0.2 MG/ML IJ SOLN
0.2000 mg | Freq: Once | INTRAMUSCULAR | Status: AC
  Administered 2019-07-23: 0.2 mg via INTRAVENOUS

## 2019-07-23 NOTE — Anesthesia Postprocedure Evaluation (Signed)
Anesthesia Post Note  Patient: Lizet Kelso  Procedure(s) Performed: ESOPHAGOGASTRODUODENOSCOPY (EGD) WITH PROPOFOL (N/A ) BIOPSY  Patient location during evaluation: PACU Anesthesia Type: General Level of consciousness: awake and alert and oriented Pain management: pain level controlled Vital Signs Assessment: post-procedure vital signs reviewed and stable Respiratory status: spontaneous breathing Cardiovascular status: stable and blood pressure returned to baseline Postop Assessment: no apparent nausea or vomiting Anesthetic complications: no   No complications documented.   Last Vitals:  Vitals:   07/23/19 1415 07/23/19 1450  BP: (!) (P) 176/83 (!) 147/95  Pulse:  (!) 103  Resp: (P) 18 12  Temp:  (P) 36.7 C  SpO2: (P) 100% 92%    Last Pain:  Vitals:   07/23/19 1433  TempSrc:   PainSc: 4                  Willi Borowiak

## 2019-07-23 NOTE — Anesthesia Preprocedure Evaluation (Signed)
Anesthesia Evaluation  Patient identified by MRN, date of birth, ID band Patient awake    Reviewed: Allergy & Precautions, H&P , NPO status , Patient's Chart, lab work & pertinent test results, reviewed documented beta blocker date and time   Airway Mallampati: II  TM Distance: >3 FB Neck ROM: full    Dental no notable dental hx. (+) Edentulous Upper, Edentulous Lower   Pulmonary neg pulmonary ROS, former smoker,    Pulmonary exam normal breath sounds clear to auscultation       Cardiovascular Exercise Tolerance: Good hypertension, negative cardio ROS   Rhythm:regular Rate:Normal     Neuro/Psych  Headaches, PSYCHIATRIC DISORDERS Anxiety Depression  Neuromuscular disease    GI/Hepatic Neg liver ROS, GERD  Medicated,  Endo/Other  negative endocrine ROS  Renal/GU negative Renal ROS  negative genitourinary   Musculoskeletal   Abdominal   Peds  Hematology  (+) Blood dyscrasia, anemia ,   Anesthesia Other Findings   Reproductive/Obstetrics negative OB ROS                             Anesthesia Physical Anesthesia Plan  ASA: II  Anesthesia Plan: General   Post-op Pain Management:    Induction:   PONV Risk Score and Plan: 2 and Propofol infusion  Airway Management Planned:   Additional Equipment:   Intra-op Plan:   Post-operative Plan:   Informed Consent: I have reviewed the patients History and Physical, chart, labs and discussed the procedure including the risks, benefits and alternatives for the proposed anesthesia with the patient or authorized representative who has indicated his/her understanding and acceptance.     Dental Advisory Given  Plan Discussed with: CRNA  Anesthesia Plan Comments:         Anesthesia Quick Evaluation

## 2019-07-23 NOTE — H&P (Signed)
@LOGO @   Primary Care Physician:  [name], MD Primary Gastroenterologist:  Dr. Gala Romney  Pre-Procedure History & Physical: HPI:  Renee Waters is a 74 y.o. female here for surveillance EGD.  History of short segment Barrett's without dysplasia on 2017 EGD.  GERD well-controlled on Protonix 40 mg once daily.  She denies dysphagia.  Past Medical History:  Diagnosis Date  . Acute cholangitis   . Allergy   . Anemia   . Anxiety   . Barrett's esophagus   . Cataract   . Chronic back pain   . Chronic neck pain   . Depression   . Genital herpes   . GERD (gastroesophageal reflux disease)   . History of blood transfusion   . Hypertension   . Insomnia   . Lupus (systemic lupus erythematosus) ([place])   . Neuromuscular disorder ([place])   . Osteoarthritis   . S/P colonoscopy June 2005   normal, no polyps  . S/P endoscopy June 2005, Oct 2009   2005: short-segment Barrett's, 2009: short-segment Barrett's  . UTI (lower urinary tract infection) 11/2012    Past Surgical History:  Procedure Laterality Date  . ABDOMINAL HYSTERECTOMY    . BACK SURGERY    . BIOPSY N/A 03/20/2014   Procedure: BIOPSY;  Surgeon: Daneil Dolin, MD;  Location: [facility];  Service: Endoscopy;  Laterality: N/A;  . BIOPSY  09/14/2015   Procedure: BIOPSY;  Surgeon: Daneil Dolin, MD;  Location: [facility];  Service: Endoscopy;;  esophageal and gastric  . CARPAL TUNNEL RELEASE Left 2013  . cervical disectomy  2002  . CHOLECYSTECTOMY     with lysis of adhesions for sbo; "ruptured gallbladder".  . COLONOSCOPY  11/09/2011   RMR: Melanosis coli  . COLONOSCOPY WITH PROPOFOL N/A 09/14/2015   Dr. Gala Romney: diverticulosis, 72mm TA removed. next TCS 09/2020.   . DENTAL SURGERY  11/2015   multiple tooth extraction  . ESOPHAGOGASTRODUODENOSCOPY  11/29/2007   salmon-colored  tongue   longest stable at  3 cm, distal esophagus as described previously status post biopsy/ Hiatal hernia, otherwise normal stomach D1 and D2  .  ESOPHAGOGASTRODUODENOSCOPY  01/06/11   short segment Barrett's esophagus s/p bx/Hiatal hernia  . ESOPHAGOGASTRODUODENOSCOPY (EGD) WITH PROPOFOL N/A 03/20/2014   PPI:RJJOACZY distal esophagus short segment barrett's, bx with no dysplasia. next egd in 03/2017  . ESOPHAGOGASTRODUODENOSCOPY (EGD) WITH PROPOFOL N/A 09/14/2015   Dr. Gala Romney: Barrett's without dysplasia, gastritis benign bx, hiatal hernia. next EGD 09/2018.  Marland Kitchen HERNIA REPAIR Right 07/2010   Dr. Zada Girt  . JOINT REPLACEMENT    . LAPAROSCOPIC CHOLECYSTECTOMY  2017   at [facility]  . POLYPECTOMY  09/14/2015   Procedure: POLYPECTOMY;  Surgeon: Daneil Dolin, MD;  Location: [facility];  Service: Endoscopy;;  ascending colon  . right hip replacement  07/2010   went back in sept 2012 to fix  . SHOULDER ARTHROSCOPY  2008   left  . TOTAL HIP REVISION Right 12/17/2012   Procedure: RIGHT TOTAL HIP REVISION;  Surgeon: Mauri Pole, MD;  Location: [facility];  Service: Orthopedics;  Laterality: Right;  . WRIST SURGERY Right 2011   open reduction right wrist.    Prior to Admission medications   Medication Sig Start Date End Date Taking? Authorizing Provider  acyclovir (ZOVIRAX) 800 MG tablet TAKE 1 TABLET BY MOUTH 4 TIMES DAILY AS NEEDED FOR  BREAKOUTS Patient taking differently: Take 800 mg by mouth daily as needed (breakouts).  12/10/18  Yes [name],  MD  ALPRAZolam (XANAX) 0.5 MG tablet Take 1 tablet (0.5 mg total) by mouth at bedtime. 12/28/18  Yes [name], MD  ALPRAZolam Duanne Moron) 0.5 MG tablet Take 1 tablet (0.5 mg total) by mouth at bedtime. 07/10/19 08/09/19 Yes [name], MD  DULoxetine (CYMBALTA) 20 MG capsule Take 2 capsules (40 mg total) by mouth daily. 03/27/19 09/23/19 Yes [place] Crane, MD  EPINEPHRINE 0.3 mg/0.3 mL IJ SOAJ injection INJECT 0.3 MLS INTO MUSCLE ONCE AS NEEDED Patient taking differently: Inject 0.3 mg into the muscle as needed for anaphylaxis.  10/04/16  Yes [name], MD  gabapentin  (NEURONTIN) 300 MG capsule TAKE 1 CAPSULE BY MOUTH ONCE DAILY FOR  CHRONIC  PAIN Patient taking differently: Take 300 mg by mouth daily as needed (pain).  04/11/19  Yes [name], MD  hydrochlorothiazide (HYDRODIURIL) 25 MG tablet Take 1 tablet (25 mg total) by mouth daily. 04/11/19  Yes [name], MD  Magnesium 250 MG TABS Take 250 mg by mouth daily.   Yes [provider]  meloxicam (MOBIC) 7.5 MG tablet Take 1 tablet (7.5 mg total) by mouth daily. Patient taking differently: Take 7.5 mg by mouth daily as needed for pain.  04/11/19  Yes Carole Civil, MD  Multiple Vitamins-Minerals (MULTIVITAMINS THER. W/MINERALS) TABS Take 1 tablet by mouth daily.    Yes [provider]  Oxycodone HCl 10 MG TABS Take 1 tablet (10 mg total) by mouth 4 (four) times daily. Patient taking differently: Take 10 mg by mouth in the morning, at noon, in the evening, and at bedtime.  02/23/16  Yes [name], MD  pantoprazole (PROTONIX) 40 MG tablet TAKE 1 TABLET BY MOUTH ONCE DAILY BEFORE BREAKFAST Patient taking differently: Take 40 mg by mouth daily before breakfast.  11/13/18  Yes Mahala Menghini, PA-C  senna (SENOKOT) 8.6 MG TABS tablet Take 2 tablets by mouth daily as needed for mild constipation.   Yes [provider]  temazepam (RESTORIL) 30 MG capsule Take 1 capsule (30 mg total) by mouth at bedtime as needed for sleep. 04/16/19  Yes [name], MD  temazepam (RESTORIL) 30 MG capsule Take 1 capsule (30 mg total) by mouth at bedtime. 07/10/19 08/09/19 Yes [name], MD  tiZANidine (ZANAFLEX) 4 MG tablet One tablet twice daily Patient taking differently: Take 6.5 mg by mouth in the morning and at bedtime.  08/02/16  Yes [name], MD  linaclotide Rolan Lipa) 290 MCG CAPS capsule Take 1 capsule (290 mcg total) by mouth daily before breakfast. Patient not taking: Reported on 07/09/2019 06/03/19   Mahala Menghini, PA-C    Allergies as of  06/03/2019 - Review Complete 06/03/2019  Allergen Reaction Noted  . Bee venom Swelling 06/12/2012  . Tyloxapol  12/10/2018  . Oxycodone-acetaminophen Rash 01/26/2015    Family History  Problem Relation Age of Onset  . Hypertension Mother   . Stroke Mother   . Colon cancer Neg Hx   . Anesthesia problems Neg Hx   . Hypotension Neg Hx   . Malignant hyperthermia Neg Hx   . Pseudochol deficiency Neg Hx   . Gastric cancer Neg Hx   . Esophageal cancer Neg Hx     Social History   Socioeconomic History  . Marital status: Married    Spouse name: louis  . Number of children: 4  . Years of education: 12+  . Highest education level: Some college, no degree  Occupational History  .  Occupation: Press photographer  - retired  . Occupation: non profit  Tobacco Use  . Smoking status: Former Smoker    Packs/day: 0.25    Years: 25.00    Pack years: 6.25    Types: Cigarettes    Quit date: 02/07/2003    Years since quitting: 16.4  . Smokeless tobacco: Never Used  . Tobacco comment: quit in 2004  Vaping Use  . Vaping Use: Never used  Substance and Sexual Activity  . Alcohol use: No  . Drug use: No  . Sexual activity: Yes    Birth control/protection: Surgical  Other Topics Concern  . Not on file  Social History Narrative  . Not on file   Social Determinants of Health   Financial Resource Strain:   . Difficulty of Paying Living Expenses:   Food Insecurity:   . Worried About Charity fundraiser in the Last Year:   . Arboriculturist in the Last Year:   Transportation Needs:   . Film/video editor (Medical):   Marland Kitchen Lack of Transportation (Non-Medical):   Physical Activity:   . Days of Exercise per Week:   . Minutes of Exercise per Session:   Stress:   . Feeling of Stress :   Social Connections:   . Frequency of Communication with Friends and Family:   . Frequency of Social Gatherings with Friends and Family:   . Attends Religious Services:   . Active Member of Clubs or  Organizations:   . Attends Archivist Meetings:   Marland Kitchen Marital Status:   Intimate Partner Violence:   . Fear of Current or Ex-Partner:   . Emotionally Abused:   Marland Kitchen Physically Abused:   . Sexually Abused:     Review of Systems: See HPI, otherwise negative ROS  Physical Exam: BP (!) 210/90   Temp 97.9 F (36.6 C) (Oral)   Resp (!) 6   Ht 5\' 5"  (1.651 m)   Wt 49.4 kg   SpO2 100%   BMI 18.14 kg/m  General:   Alert,  Well-developed, well-nourished, pleasant and cooperative in NAD Mouth:  No deformity or lesions. Neck:  Supple; no masses or thyromegaly. No significant cervical adenopathy. Lungs:  Clear throughout to auscultation.   No wheezes, crackles, or rhonchi. No acute distress. Heart:  Regular rate and rhythm; no murmurs, clicks, rubs,  or gallops. Abdomen: Non-distended, normal bowel sounds.  Soft and nontender without appreciable mass or hepatosplenomegaly.  Pulses:  Normal pulses noted. Extremities:  Without clubbing or edema.  Impression/Plan: 74 year old lady with short segment Barrett's.  No upper GI tract symptoms.  Here for surveillance EGD per plan.  The risks, benefits, limitations, alternatives and imponderables have been reviewed with the patient. Potential for esophageal dilation, biopsy, etc. have also been reviewed.  Questions have been answered. All parties agreeable.     Notice: This dictation was prepared with Dragon dictation along with smaller phrase technology. Any transcriptional errors that result from this process are unintentional and may not be corrected upon review.

## 2019-07-23 NOTE — Discharge Instructions (Signed)
EGD Discharge instructions Please read the instructions outlined below and refer to this sheet in the next few weeks. These discharge instructions provide you with general information on caring for yourself after you leave the hospital. Your doctor may also give you specific instructions. While your treatment has been planned according to the most current medical practices available, unavoidable complications occasionally occur. If you have any problems or questions after discharge, please call your doctor. ACTIVITY  You may resume your regular activity but move at a slower pace for the next 24 hours.   Take frequent rest periods for the next 24 hours.   Walking will help expel (get rid of) the air and reduce the bloated feeling in your abdomen.   No driving for 24 hours (because of the anesthesia (medicine) used during the test).   You may shower.   Do not sign any important legal documents or operate any machinery for 24 hours (because of the anesthesia used during the test).  NUTRITION  Drink plenty of fluids.   You may resume your normal diet.   Begin with a light meal and progress to your normal diet.   Avoid alcoholic beverages for 24 hours or as instructed by your caregiver.  MEDICATIONS  You may resume your normal medications unless your caregiver tells you otherwise.  WHAT YOU CAN EXPECT TODAY  You may experience abdominal discomfort such as a feeling of fullness or "gas" pains.  FOLLOW-UP  Your doctor will discuss the results of your test with you.  SEEK IMMEDIATE MEDICAL ATTENTION IF ANY OF THE FOLLOWING OCCUR:  Excessive nausea (feeling sick to your stomach) and/or vomiting.   Severe abdominal pain and distention (swelling).   Trouble swallowing.   Temperature over 101 F (37.8 C).   Rectal bleeding or vomiting of blood.    Samples taken today.  Further recommendations to follow pending review of pathology report  Continue Protonix 40 mg daily  At  patient request, I called Ashna Dorough at [phone] -left message with findings on voicemail

## 2019-07-23 NOTE — Transfer of Care (Signed)
Immediate Anesthesia Transfer of Care Note  Patient: Renee Waters  Procedure(s) Performed: ESOPHAGOGASTRODUODENOSCOPY (EGD) WITH PROPOFOL (N/A ) BIOPSY  Patient Location: PACU  Anesthesia Type:General  Level of Consciousness: awake  Airway & Oxygen Therapy: Patient Spontanous Breathing  Post-op Assessment: Report given to RN  Post vital signs: Reviewed and stable  Last Vitals:  Vitals Value Taken Time  BP 147/95 07/23/19 1450  Temp    Pulse 102 07/23/19 1451  Resp 27 07/23/19 1451  SpO2 92 % 07/23/19 1451  Vitals shown include unvalidated device data.  Last Pain:  Vitals:   07/23/19 1433  TempSrc:   PainSc: 4       Patients Stated Pain Goal: 8 (78/71/83 6725)  Complications: No complications documented.

## 2019-07-23 NOTE — Op Note (Signed)
[facility] Patient Name: Renee Waters Procedure Date: 07/23/2019 2:15 PM MRN: [id number] Date of Birth: [birth year] Attending MD: Norvel Richards , MD CSN: [id number] Age: 74 Admit Type: Outpatient Procedure:                Upper GI endoscopy Indications:              Barrett's esophagus Providers:                Norvel Richards, MD, Jeanann Lewandowsky. Gwenlyn Perking RN, RN,                            Crystal Page, Aram Candela Referring MD:              Medicines:                Propofol per Anesthesia Complications:            No immediate complications. Estimated Blood Loss:     Estimated blood loss was minimal. Procedure:                Pre-Anesthesia Assessment:                           - Prior to the procedure, a History and Physical                            was performed, and patient medications and                            allergies were reviewed. The patient's tolerance of                            previous anesthesia was also reviewed. The risks                            and benefits of the procedure and the sedation                            options and risks were discussed with the patient.                            All questions were answered, and informed consent                            was obtained. Prior Anticoagulants: The patient has                            taken no previous anticoagulant or antiplatelet                            agents. ASA Grade Assessment: II - A patient with                            mild systemic disease. After reviewing the risks  and benefits, the patient was deemed in                            satisfactory condition to undergo the procedure.                           After obtaining informed consent, the endoscope was                            passed under direct vision. Throughout the                            procedure, the patient's blood pressure, pulse, and                            oxygen  saturations were monitored continuously. The                            GIF-H190 ([id number]) was introduced through the                            mouth, and advanced to the second part of duodenum.                            The upper GI endoscopy was accomplished without                            difficulty. The patient tolerated the procedure                            well. Scope In: 2:39:22 PM Scope Out: 2:44:04 PM Total Procedure Duration: 0 hours 4 minutes 42 seconds  Findings:      Single 2 cm "tongue" of salmon-colored epithelium coming up from the GE       junction. No nodularity. No esophagitis.      A small hiatal hernia was present. Stomach otherwise appeared normal.       Patent pylorus. (2) 5 mm innocent appearing AVMs in the bulb; otherwise,       the bulb and second portion appeared normal. Biopsies of the abnormal       distal esophagus taken for histologic study. Impression:               -Abnormal distal esophagus consistent with                            Barrett's esophagus?"status post biopsy. Small                            hiatal hernia. Duodenal AVMs. Moderate Sedation:      Moderate (conscious) sedation was personally administered by an       anesthesia professional. The following parameters were monitored: oxygen       saturation, heart rate, blood pressure, respiratory rate, EKG, adequacy       of pulmonary ventilation, and response to care. Recommendation:           - Patient  has a contact number available for                            emergencies. The signs and symptoms of potential                            delayed complications were discussed with the                            patient. Return to normal activities tomorrow.                            Written discharge instructions were provided to the                            patient.                           - Advance diet as tolerated today. Follow-up on                            pathology.  Continue Protonix 40 mg daily                           Office visit with [place] in 6 months. Procedure Code(s):        --- Professional ---                           [phone], Esophagogastroduodenoscopy, flexible,                            transoral; diagnostic, including collection of                            specimen(s) by brushing or washing, when performed                            (separate procedure) Diagnosis Code(s):        --- Professional ---                           K44.9, Diaphragmatic hernia without obstruction or                            gangrene                           K22.70, Barrett's esophagus without dysplasia CPT copyright 2019 American Medical Association. All rights reserved. The codes documented in this report are preliminary and upon coder review may  be revised to meet current compliance requirements. [name]. Kayelyn Lemon, MD Norvel Richards, MD 07/23/2019 2:54:41 PM This report has been signed electronically. Number of Addenda: 0

## 2019-07-25 ENCOUNTER — Encounter: Payer: Self-pay | Admitting: Internal Medicine

## 2019-07-25 LAB — SURGICAL PATHOLOGY

## 2019-07-26 ENCOUNTER — Encounter (HOSPITAL_COMMUNITY): Payer: Self-pay | Admitting: Internal Medicine

## 2019-08-01 ENCOUNTER — Ambulatory Visit: Payer: Medicare Other

## 2019-08-01 ENCOUNTER — Other Ambulatory Visit: Payer: Self-pay

## 2019-08-01 DIAGNOSIS — R52 Pain, unspecified: Secondary | ICD-10-CM

## 2019-08-02 ENCOUNTER — Other Ambulatory Visit (HOSPITAL_COMMUNITY)
Admission: RE | Admit: 2019-08-02 | Discharge: 2019-08-02 | Disposition: A | Discharge status: Home / Self Care | Payer: Medicare Other | Source: Other Acute Inpatient Hospital | Attending: Family Medicine | Admitting: Family Medicine

## 2019-08-02 ENCOUNTER — Other Ambulatory Visit (HOSPITAL_COMMUNITY): Payer: Self-pay | Admitting: Family Medicine

## 2019-08-02 DIAGNOSIS — R52 Pain, unspecified: Secondary | ICD-10-CM | POA: Insufficient documentation

## 2019-08-05 ENCOUNTER — Other Ambulatory Visit: Payer: Self-pay

## 2019-08-05 ENCOUNTER — Ambulatory Visit (INDEPENDENT_AMBULATORY_CARE_PROVIDER_SITE_OTHER): Payer: Medicare Other | Admitting: Family Medicine

## 2019-08-05 ENCOUNTER — Encounter: Payer: Self-pay | Admitting: Family Medicine

## 2019-08-05 VITALS — BP 178/84 | HR 68 | Temp 98.2°F | Resp 16 | Ht 65.0 in | Wt 119.0 lb

## 2019-08-05 DIAGNOSIS — K2100 Gastro-esophageal reflux disease with esophagitis, without bleeding: Secondary | ICD-10-CM

## 2019-08-05 DIAGNOSIS — F418 Other specified anxiety disorders: Secondary | ICD-10-CM

## 2019-08-05 DIAGNOSIS — I10 Essential (primary) hypertension: Secondary | ICD-10-CM | POA: Diagnosis not present

## 2019-08-05 DIAGNOSIS — F5105 Insomnia due to other mental disorder: Secondary | ICD-10-CM

## 2019-08-05 MED ORDER — AMLODIPINE BESYLATE 5 MG PO TABS
5.0000 mg | ORAL_TABLET | Freq: Every day | ORAL | 1 refills | Status: DC
Start: 2019-08-05 — End: 2019-09-16

## 2019-08-05 NOTE — Patient Instructions (Addendum)
F/U in office with MD to re evaluate blood pressure in 6 to 8 weeks  New additional medication for blood pressure is amlodipine 5 mg one daily \ Thanks for choosing [facility], we consider it a privelige to serve you.

## 2019-08-05 NOTE — Assessment & Plan Note (Addendum)
Uncontrolled Add amlodipine 5 mg f/U I 5 to 8 weeks DASH diet and commitment to daily physical activity for a minimum of 30 minutes discussed and encouraged, as a part of hypertension management. The importance of attaining a healthy weight is also discussed.  BP/Weight 08/05/2019 07/23/2019 06/03/2019 04/11/2019 12/10/2018 12/03/2018 0/01/2240  Systolic BP 146 431 427 670 110 034 961  Diastolic BP 84 93 92 66 74 74 73  Wt. (Lbs) 119.04 109 120.8 110 119 119 120  BMI 19.81 18.14 20.1 18.3 19.8 19.8 19.97  Some encounter information is confidential and restricted. Go to Review Flowsheets activity to see all data.

## 2019-08-08 ENCOUNTER — Other Ambulatory Visit: Payer: Self-pay | Admitting: Family Medicine

## 2019-08-08 ENCOUNTER — Encounter: Payer: Self-pay | Admitting: Family Medicine

## 2019-08-08 MED ORDER — TEMAZEPAM 30 MG PO CAPS
30.0000 mg | ORAL_CAPSULE | Freq: Every evening | ORAL | 3 refills | Status: DC | PRN
Start: 2019-08-08 — End: 2019-09-16

## 2019-08-08 MED ORDER — ALPRAZOLAM 0.5 MG PO TABS: 0.5000 mg | ORAL_TABLET | Freq: Every day | ORAL | 3 refills | Status: AC

## 2019-08-08 NOTE — Assessment & Plan Note (Signed)
Sleep hygiene reviewed and written information offered also. Prescription sent for  medication needed.  

## 2019-08-08 NOTE — Progress Notes (Signed)
° °  Renee Waters     MRN: [id number]      DOB: 08-Sep-1945   HPI Renee Waters is here for follow up and re-evaluation of chronic medical conditions, medication management and review of any available recent lab and radiology data.  Preventive health is updated, specifically  Cancer screening and Immunization.   States her back pain is uncontrolled and she is working with pain management. C/o increased stress due to illnesses in family  ROS Denies recent fever or chills. Denies sinus pressure, nasal congestion, ear pain or sore throat. Denies chest congestion, productive cough or wheezing. Denies chest pains, palpitations and leg swelling Denies abdominal pain, nausea, vomiting,diarrhea or constipation.   Denies dysuria, frequency, hesitancy or incontinence. . Denies skin break down or rash.   PE  BP (!) 178/84    Pulse 68    Temp 98.2 F (36.8 C)    Resp 16    Ht 5\' 5"  (1.651 m)    Wt 119 lb 0.6 oz (54 kg)    SpO2 100%    BMI 19.81 kg/m   Patient alert and oriented and in no cardiopulmonary distress.  HEENT: No facial asymmetry, EOMI,     Neck supple .  Chest: Clear to auscultation bilaterally.  CVS: S1, S2 no murmurs, no S3.Regular rate.  ABD: Soft non tender.   Ext: No edema  HF:[id number]  ROM lumbar  spine, adequate in shoulders, hips and knees.  Skin: Intact, no ulcerations or rash noted.  Psych: Good eye contact, normal affect. Memory intact not anxious or depressed appearing.  CNS: CN 2-12 intact, power,  normal throughout.no focal deficits noted.   Assessment & Plan  Essential hypertension Uncontrolled Add amlodipine 5 mg f/U I 5 to 8 weeks DASH diet and commitment to daily physical activity for a minimum of 30 minutes discussed and encouraged, as a part of hypertension management. The importance of attaining a healthy weight is also discussed.  BP/Weight 08/05/2019 07/23/2019 06/03/2019 04/11/2019 12/10/2018 12/03/2018 06/09/7739  Systolic BP 287 867 672 094 114  709 628  Diastolic BP 84 93 92 66 74 74 73  Wt. (Lbs) 119.04 109 120.8 110 119 119 120  BMI 19.81 18.14 20.1 18.3 19.8 19.8 19.97  Some encounter information is confidential and restricted. Go to Review Flowsheets activity to see all data.       Insomnia secondary to depression with anxiety Sleep hygiene reviewed and written information offered also. Prescription sent for  medication needed.   GERD (gastroesophageal reflux disease) Managed by GI and stable

## 2019-08-08 NOTE — Assessment & Plan Note (Signed)
Managed by GI and stable

## 2019-08-09 LAB — MISC LABCORP TEST (SEND OUT): Labcorp test code: 738526
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
## 2019-08-26 ENCOUNTER — Telehealth (INDEPENDENT_AMBULATORY_CARE_PROVIDER_SITE_OTHER): Payer: Medicare Other | Admitting: Psychiatry

## 2019-08-26 ENCOUNTER — Encounter (HOSPITAL_COMMUNITY): Payer: Self-pay | Admitting: Psychiatry

## 2019-08-26 ENCOUNTER — Other Ambulatory Visit: Payer: Self-pay

## 2019-08-26 DIAGNOSIS — F3341 Major depressive disorder, recurrent, in partial remission: Secondary | ICD-10-CM

## 2019-08-26 NOTE — Patient Instructions (Signed)
1.Cotninueduloxetine 40 mg daily 2. Next appointment: 11/15 at 1 PM

## 2019-09-16 ENCOUNTER — Ambulatory Visit (INDEPENDENT_AMBULATORY_CARE_PROVIDER_SITE_OTHER): Payer: Medicare Other | Admitting: Family Medicine

## 2019-09-16 ENCOUNTER — Encounter: Payer: Self-pay | Admitting: Family Medicine

## 2019-09-16 ENCOUNTER — Other Ambulatory Visit: Payer: Self-pay

## 2019-09-16 VITALS — BP 125/78 | HR 71 | Resp 16 | Ht 65.0 in | Wt 119.0 lb

## 2019-09-16 DIAGNOSIS — R636 Underweight: Secondary | ICD-10-CM

## 2019-09-16 DIAGNOSIS — G8929 Other chronic pain: Secondary | ICD-10-CM

## 2019-09-16 DIAGNOSIS — R634 Abnormal weight loss: Secondary | ICD-10-CM | POA: Diagnosis not present

## 2019-09-16 DIAGNOSIS — I10 Essential (primary) hypertension: Secondary | ICD-10-CM

## 2019-09-16 DIAGNOSIS — F418 Other specified anxiety disorders: Secondary | ICD-10-CM

## 2019-09-16 DIAGNOSIS — F5105 Insomnia due to other mental disorder: Secondary | ICD-10-CM

## 2019-09-16 DIAGNOSIS — R2681 Unsteadiness on feet: Secondary | ICD-10-CM

## 2019-09-16 DIAGNOSIS — M5440 Lumbago with sciatica, unspecified side: Secondary | ICD-10-CM

## 2019-09-16 MED ORDER — KETOROLAC TROMETHAMINE 60 MG/2ML IM SOLN
60.0000 mg | Freq: Once | INTRAMUSCULAR | Status: AC
  Administered 2019-09-16: 60 mg via INTRAMUSCULAR

## 2019-09-16 MED ORDER — METHYLPREDNISOLONE ACETATE 80 MG/ML IJ SUSP
80.0000 mg | Freq: Once | INTRAMUSCULAR | Status: AC
  Administered 2019-09-16: 80 mg via INTRAMUSCULAR

## 2019-09-16 NOTE — Progress Notes (Signed)
   Renee Waters     MRN: [id number]      DOB: 1945/02/12   HPI Renee Waters is here for follow up and re-evaluation of uncontrolled hypertension, medication management and review of any available recent lab and radiology data.  Preventive health is updated, specifically  Cancer screening and Immunization.   C/o uncontrolled back pain radiating to legs, and requests injections at visit for pain   ROS Denies recent fever or chills. Denies sinus pressure, nasal congestion, ear pain or sore throat. Denies chest congestion, productive cough or wheezing. Denies chest pains, palpitations and leg swelling Denies abdominal pain, nausea,c/o being underweight and poor appetite , wants to gain weight vomiting,diarrheaDenies dysuria, frequency, hesitancy or incontinence.  Denies headaches, seizures, numbness, or tingling. Denies uncontrolled  depression, anxiety or insomnia. Denies skin break down or rash.   PE  BP 125/78   Pulse 71   Resp 16   Ht 5\' 5"  (1.651 m)   Wt 119 lb (54 kg)   SpO2 96%   BMI 19.80 kg/m   Patient alert and oriented and in no cardiopulmonary distress.  HEENT: No facial asymmetry, EOMI,     Neck decreased ROM.  Chest: Clear to auscultation bilaterally.  CVS: S1, S2 no murmurs, no S3.Regular rate.  ABD: Soft non tender.   Ext: No edema  MS: decreased  ROM lumbar  spine, adequate in shoulders, hips and knees.  Skin: Intact, no ulcerations or rash noted.  Psych: Good eye contact, normal affect. Memory intact not anxious or depressed appearing.  CNS: CN 2-12 intact, power,  normal throughout.no focal deficits noted.   Assessment & Plan  Essential hypertension Controlled, no change in medication DASH diet and commitment to daily physical activity for a minimum of 30 minutes discussed and encouraged, as a part of hypertension management. The importance of attaining a healthy weight is also discussed.  BP/Weight 09/16/2019 08/05/2019 07/23/2019 06/03/2019  04/11/2019 12/10/2018 62/83/6629  Systolic BP 476 546 503 546 568 127 517  Diastolic BP 78 84 93 92 66 74 74  Wt. (Lbs) 119 119.04 109 120.8 110 119 119  BMI 19.8 19.81 18.14 20.1 18.3 19.8 19.8  Some encounter information is confidential and restricted. Go to Review Flowsheets activity to see all data.       Depression with anxiety Managed by Psych and stable  Insomnia secondary to depression with anxiety Sleep hygiene reviewed and written information offered also. Prescription sent for  medication needed. Controlled, no change in medication   Unsteady gait Home safety discussed   Chronic midline low back pain with sciatica Uncontrolled.Toradol and depo medrol administered IM in the office ,.  Mildly underweight adult 3 boost or ensure supplements daily recommended in addition to at least 3 meals, encouraged intentional intake of high calorie , healthy snacks also

## 2019-09-16 NOTE — Patient Instructions (Addendum)
F/U in office MD in 6 months, call if you need me before  Blood pressure is excellent  Increase ensure to 3 cans/ day more calories , weight gain   Call and come for flu vaccine in Septemebr OK to take senna two  daily , if no bM on day 3, then take  4  On that day with smooth move  TSH and chem 7 and egfr  TODAY  toadol and depomedrol o in office today for back pain

## 2019-09-17 LAB — BMP8+EGFR
BUN/Creatinine Ratio: 20 (ref 12–28)
BUN: 18 mg/dL (ref 8–27)
CO2: 26 mmol/L (ref 20–29)
Calcium: 9.2 mg/dL (ref 8.7–10.3)
Chloride: 100 mmol/L (ref 96–106)
Creatinine, Ser: 0.89 mg/dL (ref 0.57–1.00)
GFR calc Af Amer: 74 mL/min/{1.73_m2} (ref >59)
GFR calc non Af Amer: 65 mL/min/{1.73_m2} (ref >59)
Glucose: 77 mg/dL (ref 65–99)
Potassium: 3.5 mmol/L (ref 3.5–5.2)
Sodium: 138 mmol/L (ref 134–144)

## 2019-09-17 LAB — TSH: TSH: 1.08 u[IU]/mL (ref 0.450–4.500)

## 2019-09-19 ENCOUNTER — Encounter: Payer: Self-pay | Admitting: Family Medicine

## 2019-09-19 DIAGNOSIS — R636 Underweight: Secondary | ICD-10-CM | POA: Insufficient documentation

## 2019-09-19 NOTE — Assessment & Plan Note (Addendum)
Uncontrolled.Toradol and depo medrol administered IM in the office , 

## 2019-09-19 NOTE — Assessment & Plan Note (Signed)
Controlled, no change in medication DASH diet and commitment to daily physical activity for a minimum of 30 minutes discussed and encouraged, as a part of hypertension management. The importance of attaining a healthy weight is also discussed.  BP/Weight 09/16/2019 08/05/2019 07/23/2019 06/03/2019 04/11/2019 12/10/2018 63/49/4944  Systolic BP 739 584 417 127 871 836 725  Diastolic BP 78 84 93 92 66 74 74  Wt. (Lbs) 119 119.04 109 120.8 110 119 119  BMI 19.8 19.81 18.14 20.1 18.3 19.8 19.8  Some encounter information is confidential and restricted. Go to Review Flowsheets activity to see all data.

## 2019-09-19 NOTE — Assessment & Plan Note (Signed)
Sleep hygiene reviewed and written information offered also. Prescription sent for  medication needed. Controlled, no change in medication  

## 2019-09-19 NOTE — Assessment & Plan Note (Signed)
Managed by Psych and stable 

## 2019-09-19 NOTE — Assessment & Plan Note (Signed)
3 boost or ensure supplements daily recommended in addition to at least 3 meals, encouraged intentional intake of high calorie , healthy snacks also

## 2019-09-19 NOTE — Assessment & Plan Note (Signed)
Home safety discussed 

## 2019-09-25 DIAGNOSIS — H43812 Vitreous degeneration, left eye: Secondary | ICD-10-CM | POA: Diagnosis not present

## 2019-09-25 DIAGNOSIS — H26493 Other secondary cataract, bilateral: Secondary | ICD-10-CM | POA: Diagnosis not present

## 2019-10-02 DIAGNOSIS — Z79891 Long term (current) use of opiate analgesic: Secondary | ICD-10-CM | POA: Diagnosis not present

## 2019-10-02 DIAGNOSIS — M47812 Spondylosis without myelopathy or radiculopathy, cervical region: Secondary | ICD-10-CM | POA: Diagnosis not present

## 2019-10-02 DIAGNOSIS — M47816 Spondylosis without myelopathy or radiculopathy, lumbar region: Secondary | ICD-10-CM | POA: Diagnosis not present

## 2019-10-02 DIAGNOSIS — G894 Chronic pain syndrome: Secondary | ICD-10-CM | POA: Diagnosis not present

## 2019-10-21 DIAGNOSIS — H35363 Drusen (degenerative) of macula, bilateral: Secondary | ICD-10-CM | POA: Diagnosis not present

## 2019-10-21 DIAGNOSIS — H26493 Other secondary cataract, bilateral: Secondary | ICD-10-CM | POA: Diagnosis not present

## 2019-10-21 DIAGNOSIS — Z961 Presence of intraocular lens: Secondary | ICD-10-CM | POA: Diagnosis not present

## 2019-10-21 DIAGNOSIS — H43812 Vitreous degeneration, left eye: Secondary | ICD-10-CM | POA: Diagnosis not present

## 2019-11-03 ENCOUNTER — Other Ambulatory Visit: Payer: Self-pay | Admitting: Family Medicine

## 2019-11-13 ENCOUNTER — Ambulatory Visit (INDEPENDENT_AMBULATORY_CARE_PROVIDER_SITE_OTHER): Payer: Medicare Other | Admitting: Family Medicine

## 2019-11-13 ENCOUNTER — Encounter: Payer: Self-pay | Admitting: Family Medicine

## 2019-11-13 ENCOUNTER — Other Ambulatory Visit: Payer: Self-pay

## 2019-11-13 ENCOUNTER — Ambulatory Visit: Payer: Medicare Other | Admitting: Family Medicine

## 2019-11-13 VITALS — BP 145/91 | HR 68 | Ht 65.0 in | Wt 118.1 lb

## 2019-11-13 DIAGNOSIS — L989 Disorder of the skin and subcutaneous tissue, unspecified: Secondary | ICD-10-CM

## 2019-11-13 DIAGNOSIS — F418 Other specified anxiety disorders: Secondary | ICD-10-CM

## 2019-11-13 DIAGNOSIS — I10 Essential (primary) hypertension: Secondary | ICD-10-CM

## 2019-11-13 DIAGNOSIS — F5105 Insomnia due to other mental disorder: Secondary | ICD-10-CM

## 2019-11-13 DIAGNOSIS — Z23 Encounter for immunization: Secondary | ICD-10-CM | POA: Diagnosis not present

## 2019-11-13 MED ORDER — LOSARTAN POTASSIUM 25 MG PO TABS: ORAL_TABLET | ORAL | 3 refills | Status: DC

## 2019-11-13 NOTE — Patient Instructions (Addendum)
F/U in office with MD re evaluate blood pressure in January first week, call if you need me before  Flu vaccine today  Blood pressure is improved but not at goal, continue hCTZ 25 mg one daily. New additional medication for blood pressure is cozaar 25 mg one daily  Take both together at the same time every day  Thanks for choosing [facility], we consider it a privelige to serve you.

## 2019-11-14 ENCOUNTER — Ambulatory Visit: Payer: Medicare Other | Admitting: Family Medicine

## 2019-11-14 ENCOUNTER — Other Ambulatory Visit: Payer: Self-pay | Admitting: Gastroenterology

## 2019-11-15 DIAGNOSIS — L989 Disorder of the skin and subcutaneous tissue, unspecified: Secondary | ICD-10-CM | POA: Insufficient documentation

## 2019-11-15 NOTE — Assessment & Plan Note (Signed)
Controlled, no change in medication  

## 2019-11-15 NOTE — Assessment & Plan Note (Signed)
Hyperpigmented soft lesion between 4th and 5th right toes, non tender, observe, if increases and becomes bothersome refer for removal, pt to call

## 2019-11-15 NOTE — Assessment & Plan Note (Signed)
Uncontrolled, not at goal, add cozaar 25 mg daily and stop HCTZ 12.5 mg, so total dose of HCTZ is 25 mg one daily DASH diet and commitment to daily physical activity for a minimum of 30 minutes discussed and encouraged, as a part of hypertension management. The importance of attaining a healthy weight is also discussed.  BP/Weight 11/13/2019 09/16/2019 08/05/2019 07/23/2019 06/03/2019 04/11/2019 44/0/1027  Systolic BP 253 664 403 474 259 563 875  Diastolic BP 91 78 84 93 92 66 74  Wt. (Lbs) 118.08 119 119.04 109 120.8 110 119  BMI 19.65 19.8 19.81 18.14 20.1 18.3 19.8  Some encounter information is confidential and restricted. Go to Review Flowsheets activity to see all data.     Re eval at [facility]

## 2019-11-15 NOTE — Progress Notes (Signed)
   Renee Waters     MRN: [id number]      DOB: Nov 15, 1945   HPI [name] is here for follow up and re-evaluation of uncontrolled hypertension. She has been taking 37.5 mg of hCTZ independently, the 12.5 mg tablet is old, and her blood pressure is still elevated. Moreover I explain to her that this is not a standard med dose.She denies adverse side effects with the dose C/o pinless swelling betweeen 4th and 5 th toe enlarging in size, wants it checked , no trauma   ROS Denies recent fever or chills. . Denies chest pains, palpitations and leg swelling Denies abdominal pain, nausea, vomiting,diarrhea or constipation.   Denies dysuria, frequency, hesitancy or incontinence. C/o chronic  joint pain, swelling and limitation in mobility. Denies headaches, seizures, numbness, or tingling. Denies uncontrolled  depression, anxiety or insomnia. Denies skin break down or rash.   PE  BP (!) 145/91 (BP Location: Right Arm, Patient Position: Sitting, Cuff Size: Normal)   Pulse 68   Ht 5\' 5"  (1.651 m)   Wt 118 lb 1.3 oz (53.6 kg)   SpO2 97%   BMI 19.65 kg/m   Patient alert and oriented and in no cardiopulmonary distress.  HEENT: No facial asymmetry, EOMI,     Neck supple .  Chest: Clear to auscultation bilaterally.  CVS: S1, S2 no murmurs, no S3.Regular rate.    Ext: No edema  MS: decreased  ROM spine, shoulders, hips and knees.  Skin: Intact, no ulcerations or rash noted.  Psych: Good eye contact, normal affect. Memory intact not anxious or depressed appearing.  CNS: CN 2-12 intact, power,  normal throughout.no focal deficits noted.   Assessment & Plan  Essential hypertension Uncontrolled, not at goal, add cozaar 25 mg daily and stop HCTZ 12.5 mg, so total dose of HCTZ is 25 mg one daily DASH diet and commitment to daily physical activity for a minimum of 30 minutes discussed and encouraged, as a part of hypertension management. The importance of attaining a healthy weight  is also discussed.  BP/Weight 11/13/2019 09/16/2019 08/05/2019 07/23/2019 06/03/2019 04/11/2019 07/11/158  Systolic BP 109 323 557 322 025 427 062  Diastolic BP 91 78 84 93 92 66 74  Wt. (Lbs) 118.08 119 119.04 109 120.8 110 119  BMI 19.65 19.8 19.81 18.14 20.1 18.3 19.8  Some encounter information is confidential and restricted. Go to Review Flowsheets activity to see all data.     Re eval at [facility]  Insomnia secondary to depression with anxiety Controlled, no change in medication   Skin lesion of foot Hyperpigmented soft lesion between 4th and 5th right toes, non tender, observe, if increases and becomes bothersome refer for removal, pt to call

## 2019-11-27 DIAGNOSIS — Z79891 Long term (current) use of opiate analgesic: Secondary | ICD-10-CM | POA: Diagnosis not present

## 2019-11-27 DIAGNOSIS — M47812 Spondylosis without myelopathy or radiculopathy, cervical region: Secondary | ICD-10-CM | POA: Diagnosis not present

## 2019-11-27 DIAGNOSIS — G894 Chronic pain syndrome: Secondary | ICD-10-CM | POA: Diagnosis not present

## 2019-11-27 DIAGNOSIS — M47816 Spondylosis without myelopathy or radiculopathy, lumbar region: Secondary | ICD-10-CM | POA: Diagnosis not present

## 2019-12-10 ENCOUNTER — Other Ambulatory Visit: Payer: Self-pay | Admitting: Family Medicine

## 2019-12-11 ENCOUNTER — Encounter: Payer: Medicare Other | Admitting: Family Medicine
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
## 2019-12-17 ENCOUNTER — Other Ambulatory Visit: Payer: Self-pay | Admitting: Family Medicine

## 2019-12-17 MED ORDER — ALPRAZOLAM 0.5 MG PO TABS: 0.5000 mg | ORAL_TABLET | Freq: Every day | ORAL | 5 refills | Status: DC

## 2019-12-18 ENCOUNTER — Ambulatory Visit (INDEPENDENT_AMBULATORY_CARE_PROVIDER_SITE_OTHER): Payer: Medicare Other | Admitting: Family Medicine

## 2019-12-18 ENCOUNTER — Other Ambulatory Visit: Payer: Self-pay

## 2019-12-18 ENCOUNTER — Encounter: Payer: Self-pay | Admitting: Family Medicine

## 2019-12-18 VITALS — BP 138/78 | HR 84 | Temp 98.0°F | Ht 65.0 in | Wt 118.0 lb

## 2019-12-18 DIAGNOSIS — Z0000 Encounter for general adult medical examination without abnormal findings: Secondary | ICD-10-CM | POA: Diagnosis not present

## 2019-12-18 NOTE — Progress Notes (Addendum)
Subjective:   Renee Waters is a 74 y.o. female who presents for Medicare Annual (Subsequent) preventive examination.  Review of Systems Cardiac Risk Factors include: advanced age (>78men, >31 women);hypertension     Objective:    Today's Vitals   12/18/19 1137 12/18/19 1139  BP: 138/78   Pulse: 84   Temp: 98 F (36.7 C)   TempSrc: Temporal   SpO2: 99%   Weight: 118 lb (53.5 kg)   Height: 5\' 5"  (1.651 m)   PainSc: 1  1   PainLoc: Back    Body mass index is 19.64 kg/m.  Advanced Directives 12/18/2019 11/29/2017 08/20/2017 02/08/2017 02/04/2016 09/09/2015 03/20/2014  Does Patient Have a Medical Advance Directive? Yes No No No No No;Yes No  Type of Paramedic of [place];Living will - - - - Press photographer;Living will -  Does patient want to make changes to medical advance directive? - - - - Yes (MAU/Ambulatory/Procedural Areas - Information given) - -  Copy of [place] in Chart? - - - - - No - copy requested -  Would patient like information on creating a medical advance directive? - No - Patient declined No - Patient declined No - Patient declined - No - patient declined information No - patient declined information  Pre-existing out of facility DNR order (yellow form or pink MOST form) - - - - - - -    Current Medications (verified) Outpatient Encounter Medications as of 12/18/2019  Medication Sig  . acyclovir (ZOVIRAX) 800 MG tablet TAKE 1 TABLET BY MOUTH 4 TIMES DAILY AS NEEDED FOR  BREAKOUTS (Patient taking differently: Take 800 mg by mouth daily as needed (breakouts). )  . ALPRAZolam (XANAX) 0.5 MG tablet Take 1 tablet (0.5 mg total) by mouth at bedtime.  Marland Kitchen EPINEPHRINE 0.3 mg/0.3 mL IJ SOAJ injection INJECT 0.3 MLS INTO MUSCLE ONCE AS NEEDED (Patient taking differently: Inject 0.3 mg into the muscle as needed for anaphylaxis. )  . gabapentin (NEURONTIN) 300 MG capsule TAKE 1 CAPSULE BY MOUTH ONCE DAILY FOR  CHRONIC   PAIN (Patient taking differently: Take 300 mg by mouth daily as needed (pain). )  . hydrochlorothiazide (HYDRODIURIL) 25 MG tablet Take 1 tablet by mouth once daily  . losartan (COZAAR) 25 MG tablet Take one tablet by mouth once daily for blood pressure  . meloxicam (MOBIC) 7.5 MG tablet Take 1 tablet (7.5 mg total) by mouth daily. (Patient taking differently: Take 7.5 mg by mouth daily as needed for pain. )  . Multiple Vitamins-Minerals (MULTIVITAMINS THER. W/MINERALS) TABS Take 1 tablet by mouth daily.   . Oxycodone HCl 10 MG TABS Take 1 tablet (10 mg total) by mouth 4 (four) times daily. (Patient taking differently: Take 10 mg by mouth in the morning, at noon, in the evening, and at bedtime. )  . pantoprazole (PROTONIX) 40 MG tablet Take 1 tablet (40 mg total) by mouth daily before breakfast.  . temazepam (RESTORIL) 30 MG capsule Take 1 capsule (30 mg total) by mouth at bedtime.  Marland Kitchen tiZANidine (ZANAFLEX) 4 MG tablet Take two tablets three times daily by mouth  . [DISCONTINUED] DULoxetine (CYMBALTA) 20 MG capsule Take 2 capsules (40 mg total) by mouth daily.  . [DISCONTINUED] senna (SENOKOT) 8.6 MG TABS tablet Take 2 tablets by mouth daily as needed for mild constipation. (Patient not taking: Reported on 12/18/2019)   No facility-administered encounter medications on file as of 12/18/2019.    Allergies (verified) Acetaminophen,  Amlodipine, Bee venom, and Tyloxapol   History: Past Medical History:  Diagnosis Date  . Acute cholangitis   . Allergy   . Anemia   . Anxiety   . Barrett's esophagus   . Cataract   . Chronic back pain   . Chronic neck pain   . Depression   . Genital herpes   . GERD (gastroesophageal reflux disease)   . History of blood transfusion   . Hypertension   . Insomnia   . Lupus (systemic lupus erythematosus) ([place])   . Neuromuscular disorder ([place])   . Osteoarthritis   . S/P colonoscopy June 2005   normal, no polyps  . S/P endoscopy June 2005, Oct 2009   2005:  short-segment Barrett's, 2009: short-segment Barrett's  . UTI (lower urinary tract infection) 11/2012   Past Surgical History:  Procedure Laterality Date  . ABDOMINAL HYSTERECTOMY    . BACK SURGERY    . BIOPSY N/A 03/20/2014   Procedure: BIOPSY;  Surgeon: Daneil Dolin, MD;  Location: [facility];  Service: Endoscopy;  Laterality: N/A;  . BIOPSY  09/14/2015   Procedure: BIOPSY;  Surgeon: Daneil Dolin, MD;  Location: [facility];  Service: Endoscopy;;  esophageal and gastric  . BIOPSY  07/23/2019   Procedure: BIOPSY;  Surgeon: Daneil Dolin, MD;  Location: [facility];  Service: Endoscopy;;  esophageal   . CARPAL TUNNEL RELEASE Left 2013  . cervical disectomy  2002  . CHOLECYSTECTOMY     with lysis of adhesions for sbo; "ruptured gallbladder".  . COLONOSCOPY  11/09/2011   RMR: Melanosis coli  . COLONOSCOPY WITH PROPOFOL N/A 09/14/2015   Dr. Gala Romney: diverticulosis, 15mm TA removed. next TCS 09/2020.   . DENTAL SURGERY  11/2015   multiple tooth extraction  . ESOPHAGOGASTRODUODENOSCOPY  11/29/2007   salmon-colored  tongue   longest stable at  3 cm, distal esophagus as described previously status post biopsy/ Hiatal hernia, otherwise normal stomach D1 and D2  . ESOPHAGOGASTRODUODENOSCOPY  01/06/11   short segment Barrett's esophagus s/p bx/Hiatal hernia  . ESOPHAGOGASTRODUODENOSCOPY (EGD) WITH PROPOFOL N/A 03/20/2014   KWI:OXBDZHGD distal esophagus short segment barrett's, bx with no dysplasia. next egd in 03/2017  . ESOPHAGOGASTRODUODENOSCOPY (EGD) WITH PROPOFOL N/A 09/14/2015   Dr. Gala Romney: Barrett's without dysplasia, gastritis benign bx, hiatal hernia. next EGD 09/2018.  Marland Kitchen ESOPHAGOGASTRODUODENOSCOPY (EGD) WITH PROPOFOL N/A 07/23/2019   Procedure: ESOPHAGOGASTRODUODENOSCOPY (EGD) WITH PROPOFOL;  Surgeon: Daneil Dolin, MD;  [facility];  Service: Endoscopy;  Laterality: N/A;  3:00pm  . HERNIA REPAIR Right 07/2010   Dr. Zada Girt  . JOINT REPLACEMENT    . LAPAROSCOPIC CHOLECYSTECTOMY   2017   at [facility]  . POLYPECTOMY  09/14/2015   Procedure: POLYPECTOMY;  Surgeon: Daneil Dolin, MD;  Location: [facility];  Service: Endoscopy;;  ascending colon  . right hip replacement  07/2010   went back in sept 2012 to fix  . SHOULDER ARTHROSCOPY  2008   left  . TOTAL HIP REVISION Right 12/17/2012   Procedure: RIGHT TOTAL HIP REVISION;  Surgeon: Mauri Pole, MD;  Location: [facility];  Service: Orthopedics;  Laterality: Right;  . WRIST SURGERY Right 2011   open reduction right wrist.   Family History  Problem Relation Age of Onset  . Hypertension Mother   . Stroke Mother   . Colon cancer Neg Hx   . Anesthesia problems Neg Hx   . Hypotension Neg Hx   . Malignant hyperthermia Neg Hx   .  Pseudochol deficiency Neg Hx   . Gastric cancer Neg Hx   . Esophageal cancer Neg Hx    Social History   Socioeconomic History  . Marital status: Married    Spouse name: louis  . Number of children: 4  . Years of education: 12+  . Highest education level: Some college, no degree  Occupational History  . Occupation: Press photographer  - retired  . Occupation: non profit  Tobacco Use  . Smoking status: Former Smoker    Packs/day: 0.25    Years: 25.00    Pack years: 6.25    Types: Cigarettes    Quit date: 02/07/2003    Years since quitting: 16.8  . Smokeless tobacco: Never Used  . Tobacco comment: quit in 2004  Vaping Use  . Vaping Use: Never used  Substance and Sexual Activity  . Alcohol use: No  . Drug use: No  . Sexual activity: Yes    Birth control/protection: Surgical  Other Topics Concern  . Not on file  Social History Narrative  . Not on file   Social Determinants of Health   Financial Resource Strain: Low Risk   . Difficulty of Paying Living Expenses: Not hard at all  Food Insecurity: No Food Insecurity  . Worried About Charity fundraiser in the Last Year: Never true  . Ran Out of Food in the Last Year: Never true  Transportation Needs: No Transportation Needs  . Lack  of Transportation (Medical): No  . Lack of Transportation (Non-Medical): No  Physical Activity: Sufficiently Active  . Days of Exercise per Week: 7 days  . Minutes of Exercise per Session: 30 min  Stress: No Stress Concern Present  . Feeling of Stress : Not at all  Social Connections: Moderately Integrated  . Frequency of Communication with Friends and Family: More than three times a week  . Frequency of Social Gatherings with Friends and Family: Once a week  . Attends Religious Services: More than 4 times per year  . Active Member of Clubs or Organizations: No  . Attends Archivist Meetings: Never  . Marital Status: Married    Tobacco Counseling Counseling given: Not Answered Comment: quit in 2004   Clinical Intake:  Pre-visit preparation completed: Yes  Pain : 0-10 Pain Score: 1  Pain Type: Chronic pain Pain Location: Back Pain Orientation: Lower Pain Descriptors / Indicators: Aching, Nagging Pain Onset: More than a month ago Pain Frequency: Intermittent Pain Relieving Factors: medication Effect of Pain on Daily Activities: mild  Pain Relieving Factors: medication  BMI - recorded: 19.65 Nutritional Status: BMI of 19-24  Normal Nutritional Risks: None Diabetes: No  What is the last grade level you completed in school?: 2 years of college  Diabetic? no  Interpreter Needed?: No  Information entered by :: Laretta Bolster, LPN   Activities of Daily Living In your present state of health, do you have any difficulty performing the following activities: 12/18/2019  Hearing? N  Vision? Y  Difficulty concentrating or making decisions? Y  Walking or climbing stairs? Y  Dressing or bathing? N  Doing errands, shopping? N  Preparing Food and eating ? N  Using the Toilet? N  In the past six months, have you accidently leaked urine? N  Do you have problems with loss of bowel control? N  Managing your Medications? N  Managing your Finances? N  Housekeeping or  managing your Housekeeping? N  Some recent data might be hidden    Patient Care  Team: [name], MD as PCP - General Rourk, [name], MD (Gastroenterology) Carole Civil, MD as Consulting Physician (Orthopedic Surgery)  Indicate any recent Medical Services you may have received from other than [facility] providers in the past year (date may be approximate).     Assessment:   This is a routine wellness examination for Genise.  Hearing/Vision screen No exam data present  Dietary issues and exercise activities discussed: Current Exercise Habits: Home exercise routine, Type of exercise: walking, Time (Minutes): 30, Frequency (Times/Week): 7, Weekly Exercise (Minutes/Week): 210, Intensity: Mild, Exercise limited by: None identified  Goals    . Patient Stated     I want to start [facility]     . Patient Stated     I have enough on my plate, but do want to also stay calm       Depression Screen PHQ 2/9 Scores 12/18/2019 12/18/2019 11/13/2019 09/16/2019 08/05/2019 04/11/2019 12/10/2018  PHQ - 2 Score 0 0 0 0 1 0 3  PHQ- 9 Score - - - - - 6 8    Fall Risk Fall Risk  12/18/2019 11/13/2019 09/16/2019 08/05/2019 04/11/2019  Falls in the past year? 1 0 1 1 1   Comment - - - - -  Number falls in past yr: 0 0 1 1 1   Injury with Fall? 0 0 0 0 0  Risk for fall due to : Impaired balance/gait - - - -  Follow up Falls evaluation completed Falls evaluation completed - - -    Any stairs in or around the home? Yes  If so, are there any without handrails? No  Home free of loose throw rugs in walkways, pet beds, electrical cords, etc? Yes  Adequate lighting in your home to reduce risk of falls? Yes   ASSISTIVE DEVICES UTILIZED TO PREVENT FALLS:  Life alert? No  Use of a cane, walker or w/c? Yes  Grab bars in the bathroom? Yes  Shower chair or bench in shower? Yes  Elevated toilet seat or a handicapped toilet? Yes   TIMED UP AND GO:  Was the test performed?  No .  Length of time to ambulate n/a    Cognitive Function: MMSE - Mini Mental State Exam 12/03/2018  Orientation to time 5  Orientation to Place 5  Registration 3  Attention/ Calculation 5  Recall 3  Language- name 2 objects 2  Language- repeat 1  Language- follow 3 step command 3  Language- read & follow direction 1  Write a sentence 1  Copy design 1  Total score 30     6CIT Screen 12/18/2019 12/10/2018 11/29/2017 02/08/2017 02/04/2016  What Year? 0 points 0 points 0 points 0 points 0 points  What month? 0 points 0 points 0 points 0 points 0 points  What time? 0 points 0 points 0 points 0 points 0 points  Count back from 20 0 points 0 points 0 points 0 points 0 points  Months in reverse 0 points 0 points 0 points 0 points 0 points  Repeat phrase 0 points 0 points 2 points 0 points 0 points  Total Score 0 0 2 0 0    Immunizations Immunization History  Administered Date(s) Administered  . Fluad Quad(high Dose 65+) 12/03/2018, 11/13/2019  . Influenza Split 11/08/2010, 12/06/2011  . Influenza Whole 11/06/2006, 11/24/2009  . Influenza, High Dose Seasonal PF 11/09/2017  . Influenza,inj,Quad PF,6+ Mos 10/31/2012, 01/14/2014, 11/25/2014, 10/05/2015, 11/24/2016  . [place] SARS-COVID-2 Vaccination  04/04/2019, 05/03/2019  . Pneumococcal Conjugate-13 09/17/2013  . Pneumococcal Polysaccharide-23 02/22/2011  . Td 07/31/2007  . Tdap 09/05/2017  . Zoster 07/31/2007    TDAP status: Up to date Flu Vaccine status: Up to date Pneumococcal vaccine status: Up to date Covid-19 vaccine status: Completed vaccines  Qualifies for Shingles Vaccine? Yes   Zostavax completed No   Shingrix Completed?: Yes  Screening Tests Health Maintenance  Topic Date Due  . MAMMOGRAM  04/11/2021  . COLONOSCOPY  09/13/2025  . TETANUS/TDAP  09/06/2027  . INFLUENZA VACCINE  Completed  . DEXA SCAN  Completed  . COVID-19 Vaccine  Completed  . Hepatitis C Screening  Completed  . PNA vac Low Risk Adult   Completed    Health Maintenance  There are no preventive care reminders to display for this patient.  Colorectal cancer screening: Completed 09/14/15. Repeat every 10 years Mammogram status: Completed 04/12/19. Repeat every year Bone Density status: Completed 03/08/13. Results reflect: Bone density results: NORMAL. Repeat every 5 years.  Lung Cancer Screening: (Low Dose CT Chest recommended if Age 64-80 years, 30 pack-year currently smoking OR have quit w/in 15years.) does not qualify.   Lung Cancer Screening Referral: n/a  Additional Screening:  Hepatitis C Screening: n/a  Vision Screening: Recommended annual ophthalmology exams for early detection of glaucoma and other disorders of the eye. Is the patient up to date with their annual eye exam?  Yes  Who is the provider or what is the name of the office in which the patient attends annual eye exams? Dr. Kathlen Mody  If pt is not established with a provider, would they like to be referred to a provider to establish care? n/a.   Dental Screening: Recommended annual dental exams for proper oral hygiene  Community Resource Referral / Chronic Care Management: CRR required this visit?  No   CCM required this visit?  No      Plan:    1. Encounter for Medicare annual wellness exam   I have personally reviewed and noted the following in the patient's chart:   . Medical and social history . Use of alcohol, tobacco or illicit drugs  . Current medications and supplements . Functional ability and status . Nutritional status . Physical activity . Advanced directives . List of other physicians . Hospitalizations, surgeries, and ER visits in previous 12 months . Vitals . Screenings to include cognitive, depression, and falls . Referrals and appointments  In addition, I have reviewed and discussed with patient certain preventive protocols, quality metrics, and best practice recommendations. A written personalized care plan for preventive  services as well as general preventive health recommendations were provided to patient.     Perlie Mayo, NP   12/18/2019   Nurse Notes: AWV conducted in office with patient. Visit took about 30 minutes to complete.

## 2019-12-23 ENCOUNTER — Other Ambulatory Visit: Payer: Self-pay

## 2019-12-23 ENCOUNTER — Telehealth: Payer: Medicare Other | Admitting: Psychiatry

## 2019-12-23 ENCOUNTER — Telehealth: Payer: Self-pay | Admitting: Psychiatry

## 2019-12-23 ENCOUNTER — Telehealth (HOSPITAL_COMMUNITY): Payer: Medicare Other | Admitting: Psychiatry

## 2019-12-23 NOTE — Telephone Encounter (Signed)
Sent link for video visit through Epic. Patient did not sign in. Called the patient  for appointment scheduled today. The patient did not answer the phone. Left voice message to contact the office.  

## 2019-12-24 ENCOUNTER — Telehealth (INDEPENDENT_AMBULATORY_CARE_PROVIDER_SITE_OTHER): Payer: Medicare Other | Admitting: Psychiatry

## 2019-12-24 ENCOUNTER — Encounter: Payer: Self-pay | Admitting: Psychiatry

## 2019-12-24 DIAGNOSIS — F3341 Major depressive disorder, recurrent, in partial remission: Secondary | ICD-10-CM | POA: Diagnosis not present
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
## 2020-01-13 ENCOUNTER — Other Ambulatory Visit: Payer: Self-pay | Admitting: Psychiatry

## 2020-01-13 DIAGNOSIS — F3342 Major depressive disorder, recurrent, in full remission: Secondary | ICD-10-CM

## 2020-01-15 DIAGNOSIS — R519 Headache, unspecified: Secondary | ICD-10-CM | POA: Diagnosis not present

## 2020-01-15 DIAGNOSIS — I10 Essential (primary) hypertension: Secondary | ICD-10-CM | POA: Diagnosis not present

## 2020-01-15 DIAGNOSIS — Z886 Allergy status to analgesic agent status: Secondary | ICD-10-CM | POA: Diagnosis not present

## 2020-01-15 DIAGNOSIS — Z20822 Contact with and (suspected) exposure to covid-19: Secondary | ICD-10-CM | POA: Diagnosis not present

## 2020-01-15 DIAGNOSIS — J3489 Other specified disorders of nose and nasal sinuses: Secondary | ICD-10-CM | POA: Diagnosis not present

## 2020-01-16 ENCOUNTER — Telehealth: Payer: Self-pay

## 2020-01-16 NOTE — Telephone Encounter (Signed)
Patient lvm stating she needed Cymbalta.She had lost her bottle and when Dr.Hisada asked her if she needed a refill she told her no because she didn't know she needed them. Patient is requesting Dr.Simpson to refill the medication. Cymbalta isn't on patient's active med list. Called patient to discuss. No answer, no vm.

## 2020-01-17 ENCOUNTER — Other Ambulatory Visit: Payer: Self-pay

## 2020-01-17 ENCOUNTER — Encounter: Payer: Self-pay | Admitting: Internal Medicine

## 2020-01-17 ENCOUNTER — Telehealth (HOSPITAL_COMMUNITY): Payer: Self-pay | Admitting: *Deleted

## 2020-01-17 ENCOUNTER — Ambulatory Visit (INDEPENDENT_AMBULATORY_CARE_PROVIDER_SITE_OTHER): Payer: Medicare Other | Admitting: Internal Medicine

## 2020-01-17 VITALS — BP 101/64 | HR 64 | Temp 98.0°F | Resp 18 | Ht 65.0 in | Wt 120.4 lb

## 2020-01-17 DIAGNOSIS — I10 Essential (primary) hypertension: Secondary | ICD-10-CM | POA: Diagnosis not present

## 2020-01-17 DIAGNOSIS — F3342 Major depressive disorder, recurrent, in full remission: Secondary | ICD-10-CM | POA: Diagnosis not present

## 2020-01-17 DIAGNOSIS — F331 Major depressive disorder, recurrent, moderate: Secondary | ICD-10-CM | POA: Diagnosis not present

## 2020-01-17 NOTE — Telephone Encounter (Signed)
Patient called and [facility] stating that during her appt with Dr. Modesta Messing she informed her that she did not need refills for her Cymbalta. Per pt she does need Cymbalta refilled.

## 2020-01-17 NOTE — Progress Notes (Signed)
Established Patient Office Visit  Subjective:  Patient ID: Renee Waters, female    DOB: 03/03/1945  Age: 74 y.o. MRN: [id number]  CC:  Chief Complaint  Patient presents with  . Follow-up    ER follow up high blood pressure this morning blood pressure is low     HPI Renee Waters is a 74 year old female with past medical history of hypertension, GERD, depression, and chronic diffuse who presents for evaluation of blood pressure after an urgent care visit for high BP.  Patient had been having high BP readings at home, about 180/90 and was having generalized, dull headache with it.  She visited urgent care for it, where blood pressure was found to be around 170/80.  She was given blood pressure medications in the urgent care, after which it was down to 130/70.  No medication change was done from the urgent care.  Today, her blood pressure is 97/64, which was found to be 101/64 upon repeat check.  She mentions occasional dizziness since the urgent care visit.  She denies any chest pain, dyspnea or palpitations.  Past Medical History:  Diagnosis Date  . Acute cholangitis   . Allergy   . Anemia   . Anxiety   . Barrett's esophagus   . Cataract   . Chronic back pain   . Chronic neck pain   . Depression   . Genital herpes   . GERD (gastroesophageal reflux disease)   . History of blood transfusion   . Hypertension   . Insomnia   . Lupus (systemic lupus erythematosus) ([place])   . Neuromuscular disorder ([place])   . Osteoarthritis   . S/P colonoscopy June 2005   normal, no polyps  . S/P endoscopy June 2005, Oct 2009   2005: short-segment Barrett's, 2009: short-segment Barrett's  . UTI (lower urinary tract infection) 11/2012    Past Surgical History:  Procedure Laterality Date  . ABDOMINAL HYSTERECTOMY    . BACK SURGERY    . BIOPSY N/A 03/20/2014   Procedure: BIOPSY;  Surgeon: Daneil Dolin, MD;  Location: [facility];  Service: Endoscopy;  Laterality: N/A;  . BIOPSY  09/14/2015    Procedure: BIOPSY;  Surgeon: Daneil Dolin, MD;  Location: [facility];  Service: Endoscopy;;  esophageal and gastric  . BIOPSY  07/23/2019   Procedure: BIOPSY;  Surgeon: Daneil Dolin, MD;  Location: [facility];  Service: Endoscopy;;  esophageal   . CARPAL TUNNEL RELEASE Left 2013  . cervical disectomy  2002  . CHOLECYSTECTOMY     with lysis of adhesions for sbo; "ruptured gallbladder".  . COLONOSCOPY  11/09/2011   RMR: Melanosis coli  . COLONOSCOPY WITH PROPOFOL N/A 09/14/2015   Dr. Gala Romney: diverticulosis, 38mm TA removed. next TCS 09/2020.   . DENTAL SURGERY  11/2015   multiple tooth extraction  . ESOPHAGOGASTRODUODENOSCOPY  11/29/2007   salmon-colored  tongue   longest stable at  3 cm, distal esophagus as described previously status post biopsy/ Hiatal hernia, otherwise normal stomach D1 and D2  . ESOPHAGOGASTRODUODENOSCOPY  01/06/11   short segment Barrett's esophagus s/p bx/Hiatal hernia  . ESOPHAGOGASTRODUODENOSCOPY (EGD) WITH PROPOFOL N/A 03/20/2014   KTG:[id number] distal esophagus short segment barrett's, bx with no dysplasia. next egd in 03/2017  . ESOPHAGOGASTRODUODENOSCOPY (EGD) WITH PROPOFOL N/A 09/14/2015   Dr. Gala Romney: Barrett's without dysplasia, gastritis benign bx, hiatal hernia. next EGD 09/2018.  Marland Kitchen ESOPHAGOGASTRODUODENOSCOPY (EGD) WITH PROPOFOL N/A 07/23/2019   Procedure: ESOPHAGOGASTRODUODENOSCOPY (EGD) WITH PROPOFOL;  Surgeon: Daneil Dolin,  MD;  Location: [facility];  Service: Endoscopy;  Laterality: N/A;  3:00pm  . HERNIA REPAIR Right 07/2010   Dr. Zada Girt  . JOINT REPLACEMENT    . LAPAROSCOPIC CHOLECYSTECTOMY  2017   at [facility]  . POLYPECTOMY  09/14/2015   Procedure: POLYPECTOMY;  Surgeon: Daneil Dolin, MD;  Location: [facility];  Service: Endoscopy;;  ascending colon  . right hip replacement  07/2010   went back in sept 2012 to fix  . SHOULDER ARTHROSCOPY  2008   left  . TOTAL HIP REVISION Right 12/17/2012   Procedure: RIGHT TOTAL HIP REVISION;  Surgeon:  Mauri Pole, MD;  Location: [facility];  Service: Orthopedics;  Laterality: Right;  . WRIST SURGERY Right 2011   open reduction right wrist.    Family History  Problem Relation Age of Onset  . Hypertension Mother   . Stroke Mother   . Colon cancer Neg Hx   . Anesthesia problems Neg Hx   . Hypotension Neg Hx   . Malignant hyperthermia Neg Hx   . Pseudochol deficiency Neg Hx   . Gastric cancer Neg Hx   . Esophageal cancer Neg Hx     Social History   Socioeconomic History  . Marital status: Married    Spouse name: louis  . Number of children: 4  . Years of education: 12+  . Highest education level: Some college, no degree  Occupational History  . Occupation: Press photographer  - retired  . Occupation: non profit  Tobacco Use  . Smoking status: Former Smoker    Packs/day: 0.25    Years: 25.00    Pack years: 6.25    Types: Cigarettes    Quit date: 02/07/2003    Years since quitting: 16.9  . Smokeless tobacco: Never Used  . Tobacco comment: quit in 2004  Vaping Use  . Vaping Use: Never used  Substance and Sexual Activity  . Alcohol use: No  . Drug use: No  . Sexual activity: Yes    Birth control/protection: Surgical  Other Topics Concern  . Not on file  Social History Narrative  . Not on file   Social Determinants of Health   Financial Resource Strain: Low Risk   . Difficulty of Paying Living Expenses: Not hard at all  Food Insecurity: No Food Insecurity  . Worried About Charity fundraiser in the Last Year: Never true  . Ran Out of Food in the Last Year: Never true  Transportation Needs: No Transportation Needs  . Lack of Transportation (Medical): No  . Lack of Transportation (Non-Medical): No  Physical Activity: Sufficiently Active  . Days of Exercise per Week: 7 days  . Minutes of Exercise per Session: 30 min  Stress: No Stress Concern Present  . Feeling of Stress : Not at all  Social Connections: Moderately Integrated  . Frequency of Communication with Friends  and Family: More than three times a week  . Frequency of Social Gatherings with Friends and Family: Once a week  . Attends Religious Services: More than 4 times per year  . Active Member of Clubs or Organizations: No  . Attends Archivist Meetings: Never  . Marital Status: Married  Human resources officer Violence: Not At Risk  . Fear of Current or Ex-Partner: No  . Emotionally Abused: No  . Physically Abused: No  . Sexually Abused: No    Outpatient Medications Prior to Visit  Medication Sig Dispense Refill  . acyclovir (ZOVIRAX) 800 MG tablet  TAKE 1 TABLET BY MOUTH 4 TIMES DAILY AS NEEDED FOR  BREAKOUTS (Patient taking differently: Take 800 mg by mouth daily as needed (breakouts).) 30 tablet 3  . ALPRAZolam (XANAX) 0.5 MG tablet Take 1 tablet (0.5 mg total) by mouth at bedtime. 30 tablet 5  . EPINEPHRINE 0.3 mg/0.3 mL IJ SOAJ injection INJECT 0.3 MLS INTO MUSCLE ONCE AS NEEDED (Patient taking differently: Inject 0.3 mg into the muscle as needed for anaphylaxis.) 1 Device 2  . gabapentin (NEURONTIN) 300 MG capsule TAKE 1 CAPSULE BY MOUTH ONCE DAILY FOR  CHRONIC  PAIN (Patient taking differently: Take 300 mg by mouth daily as needed (pain).) 90 capsule 1  . hydrochlorothiazide (HYDRODIURIL) 25 MG tablet Take 1 tablet by mouth once daily 90 tablet 0  . losartan (COZAAR) 25 MG tablet Take one tablet by mouth once daily for blood pressure 30 tablet 3  . meloxicam (MOBIC) 7.5 MG tablet Take 1 tablet (7.5 mg total) by mouth daily. (Patient taking differently: Take 7.5 mg by mouth daily as needed for pain.) 60 tablet 2  . Multiple Vitamins-Minerals (MULTIVITAMINS THER. W/MINERALS) TABS Take 1 tablet by mouth daily.    . Oxycodone HCl 10 MG TABS Take 1 tablet (10 mg total) by mouth 4 (four) times daily. (Patient taking differently: Take 10 mg by mouth in the morning, at noon, in the evening, and at bedtime.) 56 tablet 0  . pantoprazole (PROTONIX) 40 MG tablet Take 1 tablet (40 mg total) by mouth  daily before breakfast. 90 tablet 3  . tiZANidine (ZANAFLEX) 4 MG tablet Take two tablets three times daily by mouth 180 tablet 0  . temazepam (RESTORIL) 30 MG capsule Take 1 capsule (30 mg total) by mouth at bedtime. 30 capsule 0   No facility-administered medications prior to visit.    Allergies  Allergen Reactions  . Bee Venom Swelling and Hives  . Acetaminophen     itches  . Amlodipine     Hair loss  . Tyloxapol   . Oxycodone-Acetaminophen Rash    Pt states, "this gives her a rash, but at home she takes oxycodone for pain relief" Pt states, "this gives her a rash, but at home she takes oxycodone for pain relief"    ROS Review of Systems  Constitutional: Negative for chills and fever.  HENT: Negative for congestion, sinus pressure, sinus pain and sore throat.   Eyes: Negative for pain and discharge.  Respiratory: Negative for cough and shortness of breath.   Cardiovascular: Negative for chest pain and palpitations.  Gastrointestinal: Negative for abdominal pain, constipation, diarrhea, nausea and vomiting.  Endocrine: Negative for polydipsia and polyuria.  Genitourinary: Negative for dysuria and hematuria.  Musculoskeletal: Positive for arthralgias and back pain. Negative for neck pain and neck stiffness.  Skin: Negative for rash.  Neurological: Positive for dizziness and headaches. Negative for weakness.  Psychiatric/Behavioral: Negative for agitation and behavioral problems.      Objective:    Physical Exam Vitals reviewed.  Constitutional:      General: She is not in acute distress.    Appearance: She is not diaphoretic.  HENT:     Head: Normocephalic and atraumatic.     Nose: Nose normal. No congestion.     Mouth/Throat:     Mouth: Mucous membranes are moist.     Pharynx: No posterior oropharyngeal erythema.  Eyes:     General: No scleral icterus.    Extraocular Movements: Extraocular movements intact.     Pupils: Pupils are  equal, round, and reactive to  light.  Cardiovascular:     Rate and Rhythm: Normal rate and regular rhythm.     Pulses: Normal pulses.     Heart sounds: Normal heart sounds. No murmur heard.   Pulmonary:     Breath sounds: Normal breath sounds. No wheezing or rales.  Abdominal:     Palpations: Abdomen is soft.     Tenderness: There is no abdominal tenderness.  Musculoskeletal:     Cervical back: Neck supple. No tenderness.     Right lower leg: No edema.     Left lower leg: No edema.  Skin:    General: Skin is warm.     Findings: No rash.  Neurological:     General: No focal deficit present.     Mental Status: She is alert and oriented to person, place, and time.     Sensory: No sensory deficit.     Motor: No weakness.  Psychiatric:        Mood and Affect: Mood normal.        Behavior: Behavior normal.     BP 101/64 (BP Location: Left Arm, Patient Position: Sitting)   Pulse 64   Temp 98 F (36.7 C) (Oral)   Resp 18   Ht 5\' 5"  (1.651 m)   Wt 120 lb 6.4 oz (54.6 kg)   SpO2 96%   BMI 20.04 kg/m  Wt Readings from Last 3 Encounters:  01/17/20 120 lb 6.4 oz (54.6 kg)  12/18/19 118 lb (53.5 kg)  11/13/19 118 lb 1.3 oz (53.6 kg)     Health Maintenance Due  Topic Date Due  . COVID-19 Vaccine (3 - Booster for Moderna series) 11/03/2019    There are no preventive care reminders to display for this patient.  Lab Results  Component Value Date   TSH 1.080 09/16/2019   Lab Results  Component Value Date   WBC 4.3 07/02/2019   HGB 11.5 07/02/2019   HCT 35.2 07/02/2019   MCV 97 07/02/2019   PLT 272 07/02/2019   Lab Results  Component Value Date   NA 138 09/16/2019   K 3.5 09/16/2019   CO2 26 09/16/2019   GLUCOSE 77 09/16/2019   BUN 18 09/16/2019   CREATININE 0.89 09/16/2019   BILITOT 0.5 11/28/2018   ALKPHOS 98 11/28/2018   AST 16 11/28/2018   ALT 9 11/28/2018   PROT 7.4 11/28/2018   ALBUMIN 4.2 11/28/2018   CALCIUM 9.2 09/16/2019   ANIONGAP 7 08/22/2017   Lab Results  Component  Value Date   CHOL 145 11/28/2018   Lab Results  Component Value Date   HDL 58 11/28/2018   Lab Results  Component Value Date   LDLCALC 72 11/28/2018   Lab Results  Component Value Date   TRIG 74 11/28/2018   Lab Results  Component Value Date   CHOLHDL 2.4 12/07/2016   Lab Results  Component Value Date   HGBA1C 6.0 (H) 11/03/2014      Assessment & Plan:   Problem List Items Addressed This Visit      Cardiovascular and Mediastinum   Essential hypertension - Primary    BP Readings from Last 1 Encounters:  01/17/20 101/64   Well-controlled with Losartan and HCTZ Labile BP, as patient reports high BP around 180/90 at home. Had Urgent care visit, was around 130/80 after blood pressure medications. BP on a lower end in the office, will continue current regimen for now. Advised to check BP at  home, and contact us if BP persistently > 140/90. Advised to bring the home blood pressure cuff in the next visit. Counseled for compliance with the medications Advised DASH diet and moderate exercise/walking, at least 150 mins/week         Other   MDD (major depressive disorder), recurrent episode, moderate (HCC)   MDD (major depressive disorder), recurrent, in full remission ([place])    Well-controlled with Cymbalta On Alprazolam and Restoril as well Follows up with Psychiatry Benzos can be a contributing factor for low BP. Observe for now as BP stable today.         No orders of the defined types were placed in this encounter.   Follow-up: Return in about 3 months (around 04/16/2020).    Lindell Spar, MD

## 2020-01-17 NOTE — Patient Instructions (Signed)
Please continue to take blood pressure medications as prescribed.  If you feel dizzy or lightheaded, please avoid taking Hydrochlorothiazide on that particular day.  Please bring the blood pressure cuff in the next visit. If your blood pressure is consistently high above 140/90, please contact us.

## 2020-01-17 NOTE — Assessment & Plan Note (Signed)
Well-controlled with Cymbalta On Alprazolam and Restoril as well Follows up with Psychiatry Benzos can be a contributing factor for low BP. Observe for now as BP stable today.

## 2020-01-17 NOTE — Assessment & Plan Note (Addendum)
BP Readings from Last 1 Encounters:  01/17/20 101/64   Well-controlled with Losartan and HCTZ Labile BP, as patient reports high BP around 180/90 at home. Had Urgent care visit, was around 130/80 after blood pressure medications. BP on a lower end in the office, will continue current regimen for now. Advised to check BP at home, and contact us if BP persistently > 140/90. Advised to bring the home blood pressure cuff in the next visit. Counseled for compliance with the medications Advised DASH diet and moderate exercise/walking, at least 150 mins/week

## 2020-01-20 ENCOUNTER — Other Ambulatory Visit: Payer: Self-pay | Admitting: Psychiatry

## 2020-01-20 MED ORDER — DULOXETINE HCL 20 MG PO CPEP: 40.0000 mg | ORAL_CAPSULE | Freq: Every day | ORAL | 0 refills | Status: DC

## 2020-01-20 NOTE — Telephone Encounter (Signed)
Ordered

## 2020-01-20 NOTE — Telephone Encounter (Signed)
Informed pt and she verbalized understanding.

## 2020-01-21 NOTE — Telephone Encounter (Signed)
Patient has spoken with behavioral health since this message.

## 2020-01-22 DIAGNOSIS — M47816 Spondylosis without myelopathy or radiculopathy, lumbar region: Secondary | ICD-10-CM | POA: Diagnosis not present

## 2020-01-22 DIAGNOSIS — M47812 Spondylosis without myelopathy or radiculopathy, cervical region: Secondary | ICD-10-CM | POA: Diagnosis not present

## 2020-01-22 DIAGNOSIS — Z79891 Long term (current) use of opiate analgesic: Secondary | ICD-10-CM | POA: Diagnosis not present

## 2020-01-22 DIAGNOSIS — G894 Chronic pain syndrome: Secondary | ICD-10-CM | POA: Diagnosis not present

## 2020-01-23 DIAGNOSIS — Z23 Encounter for immunization: Secondary | ICD-10-CM | POA: Diagnosis not present

## 2020-01-29 ENCOUNTER — Other Ambulatory Visit: Payer: Self-pay | Admitting: Family Medicine

## 2020-02-02 ENCOUNTER — Other Ambulatory Visit: Payer: Self-pay | Admitting: Family Medicine

## 2020-02-04 ENCOUNTER — Other Ambulatory Visit: Payer: Self-pay | Admitting: Family Medicine

## 2020-02-06 ENCOUNTER — Encounter: Payer: Self-pay | Admitting: Internal Medicine

## 2020-02-06 ENCOUNTER — Other Ambulatory Visit: Payer: Self-pay | Admitting: Family Medicine

## 2020-02-10 ENCOUNTER — Other Ambulatory Visit: Payer: Self-pay | Admitting: Family Medicine

## 2020-02-11 ENCOUNTER — Other Ambulatory Visit: Payer: Self-pay | Admitting: Family Medicine

## 2020-02-11 ENCOUNTER — Telehealth: Payer: Self-pay

## 2020-02-11 NOTE — Telephone Encounter (Signed)
Not sure what she is talking about- her script was faxed yesterday and refaxed again just a bit ago.

## 2020-02-11 NOTE — Telephone Encounter (Signed)
Patient needing a paper copy script for temazepam sent to [facility] in [place] p# [phone]

## 2020-03-06 ENCOUNTER — Other Ambulatory Visit: Payer: Self-pay | Admitting: Family Medicine

## 2020-03-09 ENCOUNTER — Other Ambulatory Visit: Payer: Self-pay | Admitting: Family Medicine

## 2020-03-11 ENCOUNTER — Other Ambulatory Visit: Payer: Self-pay | Admitting: Family Medicine

## 2020-03-16 ENCOUNTER — Other Ambulatory Visit (HOSPITAL_COMMUNITY): Payer: Self-pay | Admitting: Family Medicine

## 2020-03-16 DIAGNOSIS — Z1231 Encounter for screening mammogram for malignant neoplasm of breast: Secondary | ICD-10-CM

## 2020-03-19 ENCOUNTER — Ambulatory Visit: Payer: Medicare Other | Admitting: Family Medicine

## 2020-03-19 ENCOUNTER — Telehealth (INDEPENDENT_AMBULATORY_CARE_PROVIDER_SITE_OTHER): Payer: Medicare Other | Admitting: Family Medicine

## 2020-03-19 ENCOUNTER — Other Ambulatory Visit: Payer: Self-pay

## 2020-03-19 DIAGNOSIS — F418 Other specified anxiety disorders: Secondary | ICD-10-CM

## 2020-03-19 DIAGNOSIS — I10 Essential (primary) hypertension: Secondary | ICD-10-CM

## 2020-03-19 DIAGNOSIS — R0781 Pleurodynia: Secondary | ICD-10-CM

## 2020-03-19 DIAGNOSIS — F5105 Insomnia due to other mental disorder: Secondary | ICD-10-CM | POA: Diagnosis not present

## 2020-03-19 DIAGNOSIS — R0789 Other chest pain: Secondary | ICD-10-CM

## 2020-03-19 DIAGNOSIS — L299 Pruritus, unspecified: Secondary | ICD-10-CM

## 2020-03-19 DIAGNOSIS — K2100 Gastro-esophageal reflux disease with esophagitis, without bleeding: Secondary | ICD-10-CM

## 2020-03-19 DIAGNOSIS — F3342 Major depressive disorder, recurrent, in full remission: Secondary | ICD-10-CM | POA: Diagnosis not present

## 2020-03-19 NOTE — Patient Instructions (Addendum)
F/U in 6 months, call if you need me sooner  Please get X ray of left ribs to evaluate the pain and intermittent swelling that you have been experiencing over the past year, all you do is go to the radiology department, the order is placed   For itching, please commit to  once daily zyrtec, and one benadryl 25 mg one at bedtime  Your thyroid function  Has been tested as recently as 09/2019, and has been tested almost every 1 to 2 years and the test has always been normal, this test is TSH, when you review your labs  Probiotic and psyllium are good for gut health Vitamins and minerals , best obtained from fresh or frozen plants are healthy  Continue to keep as active as possible, and to make healthy food choices  Thanks for choosing [facility], we consider it a privelige to serve you.      [email or web address]">  DASH Eating Plan DASH stands for Dietary Approaches to Stop Hypertension. The DASH eating plan is a healthy eating plan that has been shown to:  Reduce high blood pressure (hypertension).  Reduce your risk for type 2 diabetes, heart disease, and stroke.  Help with weight loss. What are tips for following this plan? Reading food labels  Check food labels for the amount of salt (sodium) per serving. Choose foods with less than 5 percent of the Daily Value of sodium. Generally, foods with less than 300 milligrams (mg) of sodium per serving fit into this eating plan.  To find whole grains, look for the word "whole" as the first word in the ingredient list. Shopping  Buy products labeled as "low-sodium" or "no salt added."  Buy fresh foods. Avoid canned foods and pre-made or frozen meals. Cooking  Avoid adding salt when cooking. Use salt-free seasonings or herbs instead of table salt or sea salt. Check with your health care provider or pharmacist before using salt substitutes.  Do not fry foods. Cook foods using  healthy methods such as baking, boiling, grilling, roasting, and broiling instead.  Cook with heart-healthy oils, such as olive, canola, avocado, soybean, or sunflower oil. Meal planning  Eat a balanced diet that includes: ? 4 or more servings of fruits and 4 or more servings of vegetables each day. Try to fill one-half of your plate with fruits and vegetables. ? 6-8 servings of whole grains each day. ? Less than 6 oz (170 g) of lean meat, poultry, or fish each day. A 3-oz (85-g) serving of meat is about the same size as a deck of cards. One egg equals 1 oz (28 g). ? 2-3 servings of low-fat dairy each day. One serving is 1 cup (237 mL). ? 1 serving of nuts, seeds, or beans 5 times each week. ? 2-3 servings of heart-healthy fats. Healthy fats called omega-3 fatty acids are found in foods such as walnuts, flaxseeds, fortified milks, and eggs. These fats are also found in cold-water fish, such as sardines, salmon, and mackerel.  Limit how much you eat of: ? Canned or prepackaged foods. ? Food that is high in trans fat, such as some fried foods. ? Food that is high in saturated fat, such as fatty meat. ? Desserts and other sweets, sugary drinks, and other foods with added sugar. ? Full-fat dairy products.  Do not salt foods before eating.  Do not eat more than 4 egg yolks a week.  Try to eat at least 2 vegetarian meals a week.  Eat  more home-cooked food and less restaurant, buffet, and fast food.   Lifestyle  When eating at a restaurant, ask that your food be prepared with less salt or no salt, if possible.  If you drink alcohol: ? Limit how much you use to:  0-1 drink a day for women who are not pregnant.  0-2 drinks a day for men. ? Be aware of how much alcohol is in your drink. In the [place] one drink equals one 12 oz bottle of beer (355 mL), one 5 oz glass of wine (148 mL), or one 1 oz glass of hard liquor (44 mL). General information  Avoid eating more than 2,300 mg of salt a  day. If you have hypertension, you may need to reduce your sodium intake to 1,500 mg a day.  Work with your health care provider to maintain a healthy body weight or to lose weight. Ask what an ideal weight is for you.  Get at least 30 minutes of exercise that causes your heart to beat faster (aerobic exercise) most days of the week. Activities may include walking, swimming, or biking.  Work with your health care provider or dietitian to adjust your eating plan to your individual calorie needs. What foods should I eat? Fruits All fresh, dried, or frozen fruit. Canned fruit in natural juice (without added sugar). Vegetables Fresh or frozen vegetables (raw, steamed, roasted, or grilled). Low-sodium or reduced-sodium tomato and vegetable juice. Low-sodium or reduced-sodium tomato sauce and tomato paste. Low-sodium or reduced-sodium canned vegetables. Grains Whole-grain or whole-wheat bread. Whole-grain or whole-wheat pasta. Brown rice. [place]. Bulgur. Whole-grain and low-sodium cereals. Pita bread. Low-fat, low-sodium crackers. Whole-wheat flour tortillas. Meats and other proteins Skinless chicken or [place]. Ground chicken or [place]. Pork with fat trimmed off. Fish and seafood. Egg whites. Dried beans, peas, or lentils. Unsalted nuts, nut butters, and seeds. Unsalted canned beans. Lean cuts of beef with fat trimmed off. Low-sodium, lean precooked or cured meat, such as sausages or meat loaves. Dairy Low-fat (1%) or fat-free (skim) milk. Reduced-fat, low-fat, or fat-free cheeses. Nonfat, low-sodium ricotta or cottage cheese. Low-fat or nonfat yogurt. Low-fat, low-sodium cheese. Fats and oils Soft margarine without trans fats. Vegetable oil. Reduced-fat, low-fat, or light mayonnaise and salad dressings (reduced-sodium). Canola, safflower, olive, avocado, soybean, and sunflower oils. Avocado. Seasonings and condiments Herbs. Spices. Seasoning mixes without salt. Other foods Unsalted popcorn  and pretzels. Fat-free sweets. The items listed above may not be a complete list of foods and beverages you can eat. Contact a dietitian for more information. What foods should I avoid? Fruits Canned fruit in a light or heavy syrup. Fried fruit. Fruit in cream or butter sauce. Vegetables Creamed or fried vegetables. Vegetables in a cheese sauce. Regular canned vegetables (not low-sodium or reduced-sodium). Regular canned tomato sauce and paste (not low-sodium or reduced-sodium). Regular tomato and vegetable juice (not low-sodium or reduced-sodium). Angie Fava. Olives. Grains Baked goods made with fat, such as croissants, muffins, or some breads. Dry pasta or rice meal packs. Meats and other proteins Fatty cuts of meat. Ribs. Fried meat. Berniece Salines. Bologna, salami, and other precooked or cured meats, such as sausages or meat loaves. Fat from the back of a pig (fatback). Bratwurst. Salted nuts and seeds. Canned beans with added salt. Canned or smoked fish. Whole eggs or egg yolks. Chicken or [place] with skin. Dairy Whole or 2% milk, cream, and half-and-half. Whole or full-fat cream cheese. Whole-fat or sweetened yogurt. Full-fat cheese. Nondairy creamers. Whipped toppings. Processed cheese and cheese  spreads. Fats and oils Butter. Stick margarine. Lard. Shortening. Ghee. Bacon fat. Tropical oils, such as coconut, palm kernel, or palm oil. Seasonings and condiments Onion salt, garlic salt, seasoned salt, table salt, and sea salt. Worcestershire sauce. Tartar sauce. Barbecue sauce. Teriyaki sauce. Soy sauce, including reduced-sodium. Steak sauce. Canned and packaged gravies. Fish sauce. Oyster sauce. Cocktail sauce. Store-bought horseradish. Ketchup. Mustard. Meat flavorings and tenderizers. Bouillon cubes. Hot sauces. Pre-made or packaged marinades. Pre-made or packaged taco seasonings. Relishes. Regular salad dressings. Other foods Salted popcorn and pretzels. The items listed above may not be a complete  list of foods and beverages you should avoid. Contact a dietitian for more information. Where to find more information  National Heart, Lung, and Blood Institute: https://wilson-eaton.com/  American Heart Association: www.heart.org  Academy of Nutrition and Dietetics: www.eatright.Jane: www.kidney.org Summary  The DASH eating plan is a healthy eating plan that has been shown to reduce high blood pressure (hypertension). It may also reduce your risk for type 2 diabetes, heart disease, and stroke.  When on the DASH eating plan, aim to eat more fresh fruits and vegetables, whole grains, lean proteins, low-fat dairy, and heart-healthy fats.  With the DASH eating plan, you should limit salt (sodium) intake to 2,300 mg a day. If you have hypertension, you may need to reduce your sodium intake to 1,500 mg a day.  Work with your health care provider or dietitian to adjust your eating plan to your individual calorie needs. This information is not intended to replace advice given to you by your health care provider. Make sure you discuss any questions you have with your health care provider. Document Revised: 12/28/2018 Document Reviewed: 12/28/2018 Elsevier Patient Education  2021 [employer].

## 2020-03-19 NOTE — Progress Notes (Addendum)
Virtual Visit via video I connected with Renee Waters on 03/19/20 at  3:00 PM EST by video  Location: Patient: home Provider: work     History of Present Illness: 1 year h/o painful area on left anterior chest which swells intermittently, pain also aggravated by bending forward, no h/o trauma to the area. Mammograms are Up to date and normal. C/o generalized itching, different parts of the body, at leasr  Days per week, responds to benadryl, no rash, no new products , has been going on for months. Denies increased stress or anxiety. Denies difficulty with speech, swallowing or breathing   F/U chronic problems and address any new or current concerns. Review and update medications and allergies. Review recent lab and radiologic data . Update routine health maintainace. Review an encourage improved health habits to include nutrition, exercise and  sleep .  Denies recent fever or chills. Denies sinus pressure, nasal congestion, ear pain or sore throat. Denies chest congestion, productive cough or wheezing. Denies  palpitations and leg swelling Denies abdominal pain, nausea, vomiting,diarrhea or constipation.   Denies dysuria, frequency, hesitancy or incontinence.  Denies headaches, seizures, numbness, or tingling. Denies uncontrolled depression, anxiety or insomnia. Denies skin break down or rash.     Observations/Objective: There were no vitals taken for this visit. Good communication with no confusion and intact memory. Alert and oriented x 3 No signs of respiratory distress during speech    Assessment and Plan:  Rib pain on left side X ray ordered to further ecvaluate  Insomnia secondary to depression with anxiety Sleep hygiene reviewed and written information offered also. Prescription sent for  medication needed.   Essential hypertension DASH diet and commitment to daily physical activity for a minimum of 30 minutes discussed and encouraged, as a part of  hypertension management. The importance of attaining a healthy weight is also discussed.  BP/Weight 01/17/2020 12/18/2019 11/13/2019 09/16/2019 08/05/2019 07/23/2019 4/48/1856  Systolic BP 314 970 263 785 885 027 741  Diastolic BP 64 78 91 78 84 93 92  Wt. (Lbs) 120.4 118 118.08 119 119.04 109 120.8  BMI 20.04 19.64 19.65 19.8 19.81 18.14 20.1  Some encounter information is confidential and restricted. Go to Review Flowsheets activity to see all data.       MDD (major depressive disorder), recurrent, in full remission ([place]) Managed by Psych and controlled, no med change  GERD (gastroesophageal reflux disease) Managed by GI, controlled   Generalized pruritus Commit to once daily zyrtec and benadryl 25 mg one at bedtime    Follow Up Instructions:    I discussed the assessment and treatment plan with the patient. The patient was provided an opportunity to ask questions and all were answered. The patient agreed with the plan and demonstrated an understanding of the instructions.   The patient was advised to call back or seek an in-person evaluation if the symptoms worsen or if the condition fails to improve as anticipated.  I provided 22 minutes of face-to-face time during this encounter.   Tula Nakayama, MD

## 2020-03-21 ENCOUNTER — Encounter: Payer: Self-pay | Admitting: Family Medicine

## 2020-03-21 DIAGNOSIS — L299 Pruritus, unspecified: Secondary | ICD-10-CM | POA: Insufficient documentation

## 2020-03-21 DIAGNOSIS — R0781 Pleurodynia: Secondary | ICD-10-CM | POA: Insufficient documentation

## 2020-03-21 MED ORDER — ALPRAZOLAM 0.5 MG PO TABS: 0.5000 mg | ORAL_TABLET | Freq: Every evening | ORAL | 3 refills | Status: DC | PRN

## 2020-03-21 MED ORDER — TEMAZEPAM 30 MG PO CAPS
30.0000 mg | ORAL_CAPSULE | Freq: Every day | ORAL | 5 refills | Status: DC
Start: 2020-04-10 — End: 2020-04-08

## 2020-03-21 NOTE — Assessment & Plan Note (Signed)
Sleep hygiene reviewed and written information offered also. Prescription sent for  medication needed.  

## 2020-03-21 NOTE — Assessment & Plan Note (Signed)
Commit to once daily zyrtec and benadryl 25 mg one at bedtime

## 2020-03-21 NOTE — Assessment & Plan Note (Signed)
DASH diet and commitment to daily physical activity for a minimum of 30 minutes discussed and encouraged, as a part of hypertension management. The importance of attaining a healthy weight is also discussed.  BP/Weight 01/17/2020 12/18/2019 11/13/2019 09/16/2019 08/05/2019 07/23/2019 7/67/2094  Systolic BP 709 628 366 294 765 465 035  Diastolic BP 64 78 91 78 84 93 92  Wt. (Lbs) 120.4 118 118.08 119 119.04 109 120.8  BMI 20.04 19.64 19.65 19.8 19.81 18.14 20.1  Some encounter information is confidential and restricted. Go to Review Flowsheets activity to see all data.

## 2020-03-21 NOTE — Assessment & Plan Note (Signed)
X ray ordered to further ecvaluate

## 2020-03-21 NOTE — Assessment & Plan Note (Signed)
Managed by Psych and controlled, no med change

## 2020-03-21 NOTE — Assessment & Plan Note (Signed)
Managed by GI, controlled ?

## 2020-03-24 ENCOUNTER — Ambulatory Visit (INDEPENDENT_AMBULATORY_CARE_PROVIDER_SITE_OTHER): Payer: Medicare Other | Admitting: Internal Medicine

## 2020-03-24 ENCOUNTER — Encounter: Payer: Self-pay | Admitting: Internal Medicine

## 2020-03-24 ENCOUNTER — Other Ambulatory Visit: Payer: Self-pay

## 2020-03-24 VITALS — BP 119/76 | HR 67 | Temp 96.3°F | Ht 65.0 in | Wt 120.2 lb

## 2020-03-24 DIAGNOSIS — K2270 Barrett's esophagus without dysplasia: Secondary | ICD-10-CM

## 2020-03-24 DIAGNOSIS — K2100 Gastro-esophageal reflux disease with esophagitis, without bleeding: Secondary | ICD-10-CM | POA: Diagnosis not present

## 2020-03-24 DIAGNOSIS — K5903 Drug induced constipation: Secondary | ICD-10-CM

## 2020-03-24 NOTE — Progress Notes (Signed)
Primary Care Physician:  [name], MD Primary Gastroenterologist:  Dr. Gala Romney  Pre-Procedure History & Physical: HPI:  Renee Waters is a 75 y.o. female here for follow-up of GERD constipation history colonic adenoma  GERD well-controlled on Protonix 40 mg daily.  Short segment Barrett's esophagus well-established on prior upper Endo's.  She has no dysphagia.  She will be due for surveillance examination in 2 years.  History of colonic adenoma removed from ascending colon 2017; due for [place] colonoscopy now.  Constipation continues to be an issue.  Did not like Amitiza or Linzess.  Stated MiraLAX did not work.  Continues on hydrocodone daily long-term.  She is, with her own combination of psyllium plant-based laxatives and stool softener and says she does pretty well that regimen  Past Medical History:  Diagnosis Date  . Acute cholangitis   . Allergy   . Anemia   . Anxiety   . Arthritis    Phreesia 03/18/2020  . Barrett's esophagus   . Cataract   . Chronic back pain   . Chronic neck pain   . Depression   . Genital herpes   . GERD (gastroesophageal reflux disease)   . History of blood transfusion   . Hypertension   . Insomnia   . Lupus (systemic lupus erythematosus) ([place])   . Neuromuscular disorder ([place])   . Osteoarthritis   . S/P colonoscopy June 2005   normal, no polyps  . S/P endoscopy June 2005, Oct 2009   2005: short-segment Barrett's, 2009: short-segment Barrett's  . UTI (lower urinary tract infection) 11/2012    Past Surgical History:  Procedure Laterality Date  . ABDOMINAL HYSTERECTOMY    . BACK SURGERY    . BIOPSY N/A 03/20/2014   Procedure: BIOPSY;  Surgeon: Daneil Dolin, MD;  Location: [facility];  Service: Endoscopy;  Laterality: N/A;  . BIOPSY  09/14/2015   Procedure: BIOPSY;  Surgeon: Daneil Dolin, MD;  Location: [facility];  Service: Endoscopy;;  esophageal and gastric  . BIOPSY  07/23/2019   Procedure: BIOPSY;  Surgeon: Daneil Dolin,  MD;  Location: [facility];  Service: Endoscopy;;  esophageal   . CARPAL TUNNEL RELEASE Left 2013  . cervical disectomy  2002  . CESAREAN SECTION N/A    Phreesia 03/18/2020  . CHOLECYSTECTOMY     with lysis of adhesions for sbo; "ruptured gallbladder".  . COLONOSCOPY  11/09/2011   RMR: Melanosis coli  . COLONOSCOPY WITH PROPOFOL N/A 09/14/2015   Dr. Gala Romney: diverticulosis, 47mm TA removed. next TCS 09/2020.   . DENTAL SURGERY  11/2015   multiple tooth extraction  . ESOPHAGOGASTRODUODENOSCOPY  11/29/2007   salmon-colored  tongue   longest stable at  3 cm, distal esophagus as described previously status post biopsy/ Hiatal hernia, otherwise normal stomach D1 and D2  . ESOPHAGOGASTRODUODENOSCOPY  01/06/11   short segment Barrett's esophagus s/p bx/Hiatal hernia  . ESOPHAGOGASTRODUODENOSCOPY (EGD) WITH PROPOFOL N/A 03/20/2014   JJK:[id number] distal esophagus short segment barrett's, bx with no dysplasia. next egd in 03/2017  . ESOPHAGOGASTRODUODENOSCOPY (EGD) WITH PROPOFOL N/A 09/14/2015   Dr. Gala Romney: Barrett's without dysplasia, gastritis benign bx, hiatal hernia. next EGD 09/2018.  Marland Kitchen ESOPHAGOGASTRODUODENOSCOPY (EGD) WITH PROPOFOL N/A 07/23/2019   Procedure: ESOPHAGOGASTRODUODENOSCOPY (EGD) WITH PROPOFOL;  Surgeon: Daneil Dolin, MD;  [facility];  Service: Endoscopy;  Laterality: N/A;  3:00pm  . EYE SURGERY N/A    Phreesia 03/18/2020  . HERNIA REPAIR Right 07/2010   Dr. Zada Girt  .  JOINT REPLACEMENT    . LAPAROSCOPIC CHOLECYSTECTOMY  2017   at [facility]  . POLYPECTOMY  09/14/2015   Procedure: POLYPECTOMY;  Surgeon: Daneil Dolin, MD;  Location: [facility];  Service: Endoscopy;;  ascending colon  . right hip replacement  07/2010   went back in sept 2012 to fix  . SHOULDER ARTHROSCOPY  2008   left  . SPINE SURGERY N/A    Phreesia 03/18/2020  . TOTAL HIP REVISION Right 12/17/2012   Procedure: RIGHT TOTAL HIP REVISION;  Surgeon: Mauri Pole, MD;  Location: [facility];  Service:  Orthopedics;  Laterality: Right;  . WRIST SURGERY Right 2011   open reduction right wrist.    Prior to Admission medications   Medication Sig Start Date End Date Taking? Authorizing Provider  acyclovir (ZOVIRAX) 800 MG tablet TAKE 1 TABLET BY MOUTH 4 TIMES DAILY AS NEEDED FOR  BREAKOUTS 01/30/20  Yes [name], MD  ALPRAZolam Duanne Moron) 0.5 MG tablet Take 1 tablet (0.5 mg total) by mouth at bedtime as needed for anxiety. 06/14/20  Yes [name], MD  DULoxetine (CYMBALTA) 20 MG capsule Take 2 capsules (40 mg total) by mouth daily. 01/20/20  Yes Hisada, Elie Goody, MD  EPINEPHRINE 0.3 mg/0.3 mL IJ SOAJ injection INJECT 0.3 MLS INTO MUSCLE ONCE AS NEEDED Patient taking differently: Inject 0.3 mg into the muscle as needed for anaphylaxis. 10/04/16  Yes [name], MD  gabapentin (NEURONTIN) 300 MG capsule TAKE 1 CAPSULE BY MOUTH ONCE DAILY FOR  CHRONIC  PAIN 03/11/20  Yes [name], MD  hydrochlorothiazide (HYDRODIURIL) 25 MG tablet Take 1 tablet by mouth once daily 02/04/20  Yes [name], MD  losartan (COZAAR) 25 MG tablet Take 1 tablet by mouth once daily for blood pressure 03/09/20  Yes [name], MD  meloxicam (MOBIC) 7.5 MG tablet Take 1 tablet (7.5 mg total) by mouth daily. Patient taking differently: Take 7.5 mg by mouth daily as needed for pain. 04/11/19  Yes Carole Civil, MD  Multiple Vitamins-Minerals (MULTIVITAMINS THER. W/MINERALS) TABS Take 1 tablet by mouth daily.   Yes [provider]  Oxycodone HCl 10 MG TABS Take 1 tablet (10 mg total) by mouth 4 (four) times daily. Patient taking differently: Take 10 mg by mouth in the morning, at noon, in the evening, and at bedtime. 02/23/16  Yes [name], MD  pantoprazole (PROTONIX) 40 MG tablet Take 1 tablet (40 mg total) by mouth daily before breakfast. 11/15/19  Yes Carlis Stable, NP  temazepam (RESTORIL) 30 MG capsule Take 1 capsule (30 mg total) by mouth at bedtime. 04/10/20   Yes [name], MD  tiZANidine (ZANAFLEX) 4 MG tablet Take two tablets three times daily by mouth 08/05/19  Yes [name], MD  ALPRAZolam Duanne Moron) 0.5 MG tablet Take 1 tablet (0.5 mg total) by mouth at bedtime. Patient not taking: Reported on 03/24/2020 12/17/19   [name], MD    Allergies as of 03/24/2020 - Review Complete 03/24/2020  Allergen Reaction Noted  . Bee venom Swelling and Hives 06/12/2012  . Acetaminophen  09/16/2019  . Amlodipine  09/16/2019  . Tyloxapol  12/10/2018  . Other Itching, Rash, and Swelling 03/18/2020  . Oxycodone-acetaminophen Rash 01/26/2015    Family History  Problem Relation Age of Onset  . Hypertension Mother   . Stroke Mother   . Colon cancer Neg Hx   . Anesthesia problems Neg Hx   . Hypotension Neg  Hx   . Malignant hyperthermia Neg Hx   . Pseudochol deficiency Neg Hx   . Gastric cancer Neg Hx   . Esophageal cancer Neg Hx     Social History   Socioeconomic History  . Marital status: Married    Spouse name: louis  . Number of children: 4  . Years of education: 12+  . Highest education level: Some college, no degree  Occupational History  . Occupation: Press photographer  - retired  . Occupation: non profit  Tobacco Use  . Smoking status: Former Smoker    Packs/day: 0.25    Years: 25.00    Pack years: 6.25    Types: Cigarettes    Quit date: 02/07/2003    Years since quitting: 17.1  . Smokeless tobacco: Never Used  . Tobacco comment: quit in 2004  Vaping Use  . Vaping Use: Never used  Substance and Sexual Activity  . Alcohol use: No  . Drug use: No  . Sexual activity: Yes    Birth control/protection: Surgical  Other Topics Concern  . Not on file  Social History Narrative  . Not on file   Social Determinants of Health   Financial Resource Strain: Low Risk   . Difficulty of Paying Living Expenses: Not hard at all  Food Insecurity: No Food Insecurity  . Worried About Charity fundraiser in the Last Year:  Never true  . Ran Out of Food in the Last Year: Never true  Transportation Needs: No Transportation Needs  . Lack of Transportation (Medical): No  . Lack of Transportation (Non-Medical): No  Physical Activity: Sufficiently Active  . Days of Exercise per Week: 7 days  . Minutes of Exercise per Session: 30 min  Stress: No Stress Concern Present  . Feeling of Stress : Not at all  Social Connections: Moderately Integrated  . Frequency of Communication with Friends and Family: More than three times a week  . Frequency of Social Gatherings with Friends and Family: Once a week  . Attends Religious Services: More than 4 times per year  . Active Member of Clubs or Organizations: No  . Attends Archivist Meetings: Never  . Marital Status: Married  Human resources officer Violence: Not At Risk  . Fear of Current or Ex-Partner: No  . Emotionally Abused: No  . Physically Abused: No  . Sexually Abused: No    Review of Systems: See HPI, otherwise negative ROS  Physical Exam: BP 119/76   Pulse 67   Temp (!) 96.3 F (35.7 C) (Temporal)   Ht 5\' 5"  (1.651 m)   Wt 120 lb 3.2 oz (54.5 kg)   BMI 20.00 kg/m  General:   Alert,  pleasant and cooperative in NAD Neck:  Supple; no masses or thyromegaly. No significant cervical adenopathy. Lungs:  Clear throughout to auscultation.   No wheezes, crackles, or rhonchi. No acute distress. Patient marked an area on her left lower anterior chest wall where she has occasional pain.  It is nontender without deformity now;  very much over the left anterior rib cage at mid axillary line -  third rib up from left costal margin. Heart:  Regular rate and rhythm; no murmurs, clicks, rubs,  or gallops. Abdomen: Non-distended, normal bowel sounds.  Soft and nontender without appreciable mass or hepatosplenomegaly.  Pulses:  Normal pulses noted. Extremities:  Without clubbing or edema.  Impression/Plan:   75 year old lady with chronic opioid-induced constipation  managing well with her own combination of plant-based laxatives and  psyllium. She failed Linzess and Amitiza previously.  History of colonic adenoma; due for surveillance colonoscopy at this time  GERD well-controlled on Protonix once daily.  History of short segment Barrett's esophagus; due for surveillance EGD 2024  Left chest wall pain of uncertain significance.  Appears to be musculoskeletal in origin.  Recommendations:  Continue Protonix 40 mg daily  As discussed, you will be due a repeat EGD to check Barrett's esophagus in 2024  We will schedule a surveillance colonoscopy-history of colon polyps.  ASA 3/propofol.  For now continue, what you are doing for constipation.  I will make further recommendations after colonoscopy has been performed.  The pain overlying the left side of your chest is not coming from your GI tract.  I agree with following up with Dr. Moshe Cipro -  a chest x-ray sounds like a good idea. A screening chest CT might also be worthwhile given your history of cigarette smoking in the past  Further recommendations to follow      Notice: This dictation was prepared with Dragon dictation along with smaller phrase technology. Any transcriptional errors that result from this process are unintentional and may not be corrected upon review.

## 2020-03-24 NOTE — Patient Instructions (Signed)
Continue Protonix 40 mg daily  As discussed, you will be due a repeat EGD to check Barrett's esophagus in 2024  We will schedule a surveillance colonoscopy-history of colon polyps.  ASA 3/propofol.  For now continue, what you are doing for constipation.  I will make further recommendations after colonoscopy has been performed.  The pain overlying the left side of your chest is not coming from your GI tract.  I agree with following up with Dr. Moshe Cipro -  a chest x-ray sounds like a good idea. A screening chest CT might also be worthwhile given your history of cigarette smoking in the past  Further recommendations to follow  It was good seeing you today.  Please do not forget to tell your husband I said hello.

## 2020-03-26 DIAGNOSIS — M25511 Pain in right shoulder: Secondary | ICD-10-CM | POA: Diagnosis not present

## 2020-04-08 ENCOUNTER — Other Ambulatory Visit: Payer: Self-pay | Admitting: Family Medicine

## 2020-04-13 ENCOUNTER — Ambulatory Visit (HOSPITAL_COMMUNITY)
Admission: RE | Admit: 2020-04-13 | Discharge: 2020-04-13 | Disposition: A | Discharge status: Home / Self Care | Payer: Medicare Other | Source: Ambulatory Visit | Attending: Family Medicine | Admitting: Family Medicine

## 2020-04-13 ENCOUNTER — Other Ambulatory Visit: Payer: Self-pay

## 2020-04-13 DIAGNOSIS — Z1231 Encounter for screening mammogram for malignant neoplasm of breast: Secondary | ICD-10-CM | POA: Diagnosis present

## 2020-04-14 ENCOUNTER — Telehealth: Payer: Medicare Other | Admitting: Psychiatry

## 2020-04-16 ENCOUNTER — Other Ambulatory Visit: Payer: Self-pay

## 2020-04-16 ENCOUNTER — Ambulatory Visit (HOSPITAL_COMMUNITY)
Admission: RE | Admit: 2020-04-16 | Discharge: 2020-04-16 | Disposition: A | Discharge status: Home / Self Care | Payer: Medicare Other | Source: Ambulatory Visit | Attending: Family Medicine | Admitting: Family Medicine

## 2020-04-16 DIAGNOSIS — R0781 Pleurodynia: Secondary | ICD-10-CM | POA: Diagnosis not present

## 2020-04-21 DIAGNOSIS — H35363 Drusen (degenerative) of macula, bilateral: Secondary | ICD-10-CM | POA: Diagnosis not present

## 2020-04-21 DIAGNOSIS — H43812 Vitreous degeneration, left eye: Secondary | ICD-10-CM | POA: Diagnosis not present

## 2020-04-21 DIAGNOSIS — H35033 Hypertensive retinopathy, bilateral: Secondary | ICD-10-CM | POA: Diagnosis not present

## 2020-04-21 DIAGNOSIS — Z961 Presence of intraocular lens: Secondary | ICD-10-CM | POA: Diagnosis not present

## 2020-04-21 DIAGNOSIS — H16142 Punctate keratitis, left eye: Secondary | ICD-10-CM | POA: Diagnosis not present

## 2020-04-21 DIAGNOSIS — H26493 Other secondary cataract, bilateral: Secondary | ICD-10-CM | POA: Diagnosis not present

## 2020-04-22 DIAGNOSIS — M797 Fibromyalgia: Secondary | ICD-10-CM | POA: Diagnosis not present

## 2020-04-22 DIAGNOSIS — M62830 Muscle spasm of back: Secondary | ICD-10-CM | POA: Diagnosis not present

## 2020-04-22 DIAGNOSIS — M47816 Spondylosis without myelopathy or radiculopathy, lumbar region: Secondary | ICD-10-CM | POA: Diagnosis not present

## 2020-04-22 DIAGNOSIS — M25512 Pain in left shoulder: Secondary | ICD-10-CM | POA: Diagnosis not present

## 2020-04-22 DIAGNOSIS — G894 Chronic pain syndrome: Secondary | ICD-10-CM | POA: Diagnosis not present

## 2020-04-22 DIAGNOSIS — M150 Primary generalized (osteo)arthritis: Secondary | ICD-10-CM | POA: Diagnosis not present

## 2020-04-22 DIAGNOSIS — M4155 Other secondary scoliosis, thoracolumbar region: Secondary | ICD-10-CM | POA: Diagnosis not present

## 2020-04-22 DIAGNOSIS — M47814 Spondylosis without myelopathy or radiculopathy, thoracic region: Secondary | ICD-10-CM | POA: Diagnosis not present

## 2020-04-22 DIAGNOSIS — M47812 Spondylosis without myelopathy or radiculopathy, cervical region: Secondary | ICD-10-CM | POA: Diagnosis not present

## 2020-04-22 DIAGNOSIS — Z79891 Long term (current) use of opiate analgesic: Secondary | ICD-10-CM | POA: Diagnosis not present

## 2020-04-22 LAB — HM DIABETES EYE EXAM

## 2020-04-27 NOTE — Patient Instructions (Signed)
Renee Waters  04/27/2020     @PREFPERIOPPHARMACY @   Your procedure is scheduled on  04/30/2020.   Report to [name] at  1130  A.M.   Call this number if you have problems the morning of surgery:  [phone]   Remember:  Follow the diet and prep instructions given to you by the office.                       Take these medicines the morning of surgery with A SIP OF WATER  Xanax (if needed), gabapentin, mobic or oxycodone (if needed), protonix, zanaflex (if needed).    Please brush your teeth.  Do not wear jewelry, make-up or nail polish.  Do not wear lotions, powders, or perfumes, or deodorant.  Do not shave 48 hours prior to surgery.  Men may shave face and neck.  Do not bring valuables to the hospital.  [facility] is not responsible for any belongings or valuables.  Contacts, dentures or bridgework may not be worn into surgery.  Leave your suitcase in the car.  After surgery it may be brought to your room.  For patients admitted to the hospital, discharge time will be determined by your treatment team.  Patients discharged the day of surgery will not be allowed to drive home and must have someone with them for 24 hours.   Special instructions:  DO NOT smoke tobacco or vape the morning of your procedure.   Please read over the following fact sheets that you were given. Anesthesia Post-op Instructions and Care and Recovery After Surgery       Colonoscopy, Adult, Care After This sheet gives you information about how to care for yourself after your procedure. Your health care provider may also give you more specific instructions. If you have problems or questions, contact your health care provider. What can I expect after the procedure? After the procedure, it is common to have:  A small amount of blood in your stool for 24 hours after the procedure.  Some gas.  Mild cramping or bloating of your abdomen. Follow these instructions at home: Eating and  drinking  Drink enough fluid to keep your urine pale yellow.  Follow instructions from your health care provider about eating or drinking restrictions.  Resume your normal diet as instructed by your health care provider. Avoid heavy or fried foods that are hard to digest.   Activity  Rest as told by your health care provider.  Avoid sitting for a long time without moving. Get up to take short walks every 1-2 hours. This is important to improve blood flow and breathing. Ask for help if you feel weak or unsteady.  Return to your normal activities as told by your health care provider. Ask your health care provider what activities are safe for you. Managing cramping and bloating  Try walking around when you have cramps or feel bloated.  Apply heat to your abdomen as told by your health care provider. Use the heat source that your health care provider recommends, such as a moist heat pack or a heating pad. ? Place a towel between your skin and the heat source. ? Leave the heat on for 20-30 minutes. ? Remove the heat if your skin turns bright red. This is especially important if you are unable to feel pain, heat, or cold. You may have a greater risk of getting burned.   General instructions  If you  were given a sedative during the procedure, it can affect you for several hours. Do not drive or operate machinery until your health care provider says that it is safe.  For the first 24 hours after the procedure: ? Do not sign important documents. ? Do not drink alcohol. ? Do your regular daily activities at a slower pace than normal. ? Eat soft foods that are easy to digest.  Take over-the-counter and prescription medicines only as told by your health care provider.  Keep all follow-up visits as told by your health care provider. This is important. Contact a health care provider if:  You have blood in your stool 2-3 days after the procedure. Get help right away if you have:  More than a  small spotting of blood in your stool.  Large blood clots in your stool.  Swelling of your abdomen.  Nausea or vomiting.  A fever.  Increasing pain in your abdomen that is not relieved with medicine. Summary  After the procedure, it is common to have a small amount of blood in your stool. You may also have mild cramping and bloating of your abdomen.  If you were given a sedative during the procedure, it can affect you for several hours. Do not drive or operate machinery until your health care provider says that it is safe.  Get help right away if you have a lot of blood in your stool, nausea or vomiting, a fever, or increased pain in your abdomen. This information is not intended to replace advice given to you by your health care provider. Make sure you discuss any questions you have with your health care provider. Document Revised: 01/18/2019 Document Reviewed: 08/20/2018 Elsevier Patient Education  2021 [place] [employer] This sheet gives you information about how to care for yourself after your procedure. Your health care provider may also give you more specific instructions. If you have problems or questions, contact your health care provider. What can I expect after the procedure? After the procedure, it is common to have:  Tiredness.  Forgetfulness about what happened after the procedure.  Impaired judgment for important decisions.  Nausea or vomiting.  Some difficulty with balance. Follow these instructions at home: For the time period you were told by your health care provider:  Rest as needed.  Do not participate in activities where you could fall or become injured.  Do not drive or use machinery.  Do not drink alcohol.  Do not take sleeping pills or medicines that cause drowsiness.  Do not make important decisions or sign legal documents.  Do not take care of children on your own.      Eating and drinking  Follow the  diet that is recommended by your health care provider.  Drink enough fluid to keep your urine pale yellow.  If you vomit: ? Drink water, juice, or soup when you can drink without vomiting. ? Make sure you have little or no nausea before eating solid foods. General instructions  Have a responsible adult stay with you for the time you are told. It is important to have someone help care for you until you are awake and alert.  Take over-the-counter and prescription medicines only as told by your health care provider.  If you have sleep apnea, surgery and certain medicines can increase your risk for breathing problems. Follow instructions from your health care provider about wearing your sleep device: ? Anytime you are sleeping, including during daytime naps. ?  While taking prescription pain medicines, sleeping medicines, or medicines that make you drowsy.  Avoid smoking.  Keep all follow-up visits as told by your health care provider. This is important. Contact a health care provider if:  You keep feeling nauseous or you keep vomiting.  You feel light-headed.  You are still sleepy or having trouble with balance after 24 hours.  You develop a rash.  You have a fever.  You have redness or swelling around the IV site. Get help right away if:  You have trouble breathing.  You have new-onset confusion at home. Summary  For several hours after your procedure, you may feel tired. You may also be forgetful and have poor judgment.  Have a responsible adult stay with you for the time you are told. It is important to have someone help care for you until you are awake and alert.  Rest as told. Do not drive or operate machinery. Do not drink alcohol or take sleeping pills.  Get help right away if you have trouble breathing, or if you suddenly become confused. This information is not intended to replace advice given to you by your health care provider. Make sure you discuss any questions  you have with your health care provider. Document Revised: 10/10/2019 Document Reviewed: 12/27/2018 Elsevier Patient Education  2021 [employer].

## 2020-04-28 ENCOUNTER — Encounter (HOSPITAL_COMMUNITY): Payer: Self-pay

## 2020-04-28 ENCOUNTER — Encounter (HOSPITAL_COMMUNITY)
Admission: RE | Admit: 2020-04-28 | Discharge: 2020-04-28 | Disposition: A | Discharge status: Home / Self Care | Payer: Medicare Other | Source: Ambulatory Visit | Attending: Internal Medicine | Admitting: Internal Medicine

## 2020-04-28 ENCOUNTER — Other Ambulatory Visit (HOSPITAL_COMMUNITY)
Admission: RE | Admit: 2020-04-28 | Discharge: 2020-04-28 | Disposition: A | Discharge status: Home / Self Care | Payer: Medicare Other | Source: Ambulatory Visit | Attending: Internal Medicine | Admitting: Internal Medicine

## 2020-04-28 ENCOUNTER — Other Ambulatory Visit: Payer: Self-pay

## 2020-04-28 DIAGNOSIS — Z01812 Encounter for preprocedural laboratory examination: Secondary | ICD-10-CM | POA: Insufficient documentation

## 2020-04-28 DIAGNOSIS — Z20822 Contact with and (suspected) exposure to covid-19: Secondary | ICD-10-CM | POA: Diagnosis not present

## 2020-04-28 HISTORY — DX: Personal history of other diseases of the musculoskeletal system and connective tissue: Z87.39

## 2020-04-28 LAB — CBC WITH DIFFERENTIAL/PLATELET
Abs Immature Granulocytes: 0.02 10*3/uL (ref 0.00–0.07)
Basophils Absolute: 0 10*3/uL (ref 0.0–0.1)
Basophils Relative: 1 %
Eosinophils Absolute: 0.1 10*3/uL (ref 0.0–0.5)
Eosinophils Relative: 2 %
HCT: 32.3 % — ABNORMAL LOW (ref 36.0–46.0)
Hemoglobin: 10.7 g/dL — ABNORMAL LOW (ref 12.0–15.0)
Immature Granulocytes: 0 %
Lymphocytes Relative: 22 %
Lymphs Abs: 1.3 10*3/uL (ref 0.7–4.0)
MCH: 31.7 pg (ref 26.0–34.0)
MCHC: 33.1 g/dL (ref 30.0–36.0)
MCV: 95.6 fL (ref 80.0–100.0)
Monocytes Absolute: 1 10*3/uL (ref 0.1–1.0)
Monocytes Relative: 16 %
Neutro Abs: 3.5 10*3/uL (ref 1.7–7.7)
Neutrophils Relative %: 59 %
Platelets: 369 10*3/uL (ref 150–400)
RBC: 3.38 MIL/uL — ABNORMAL LOW (ref 3.87–5.11)
RDW: 14 % (ref 11.5–15.5)
WBC: 6 10*3/uL (ref 4.0–10.5)
nRBC: 0 % (ref 0.0–0.2)

## 2020-04-28 LAB — BASIC METABOLIC PANEL
Anion gap: 8 (ref 5–15)
BUN: 24 mg/dL — ABNORMAL HIGH (ref 8–23)
CO2: 26 mmol/L (ref 22–32)
Calcium: 8.6 mg/dL — ABNORMAL LOW (ref 8.9–10.3)
Chloride: 102 mmol/L (ref 98–111)
Creatinine, Ser: 1.11 mg/dL — ABNORMAL HIGH (ref 0.44–1.00)
GFR, Estimated: 52 mL/min — ABNORMAL LOW (ref >60)
Glucose, Bld: 110 mg/dL — ABNORMAL HIGH (ref 70–99)
Potassium: 3.8 mmol/L (ref 3.5–5.1)
Sodium: 136 mmol/L (ref 135–145)

## 2020-04-28 LAB — SARS CORONAVIRUS 2 (TAT 6-24 HRS): SARS Coronavirus 2: NEGATIVE

## 2020-04-30 ENCOUNTER — Encounter (HOSPITAL_COMMUNITY)
Admission: RE | Disposition: A | Discharge status: Home / Self Care | Payer: Self-pay | Source: Ambulatory Visit | Attending: Internal Medicine

## 2020-04-30 ENCOUNTER — Ambulatory Visit (HOSPITAL_COMMUNITY)
Admission: RE | Admit: 2020-04-30 | Discharge: 2020-04-30 | Disposition: A | Discharge status: Home / Self Care | Payer: Medicare Other | Source: Ambulatory Visit | Attending: Internal Medicine | Admitting: Internal Medicine

## 2020-04-30 ENCOUNTER — Ambulatory Visit (HOSPITAL_COMMUNITY): Payer: Medicare Other | Admitting: Anesthesiology

## 2020-04-30 ENCOUNTER — Encounter (HOSPITAL_COMMUNITY): Payer: Self-pay | Admitting: Internal Medicine

## 2020-04-30 ENCOUNTER — Other Ambulatory Visit: Payer: Self-pay

## 2020-04-30 DIAGNOSIS — K6389 Other specified diseases of intestine: Secondary | ICD-10-CM | POA: Diagnosis not present

## 2020-04-30 DIAGNOSIS — Z8619 Personal history of other infectious and parasitic diseases: Secondary | ICD-10-CM | POA: Diagnosis not present

## 2020-04-30 DIAGNOSIS — Z886 Allergy status to analgesic agent status: Secondary | ICD-10-CM | POA: Insufficient documentation

## 2020-04-30 DIAGNOSIS — Z8249 Family history of ischemic heart disease and other diseases of the circulatory system: Secondary | ICD-10-CM | POA: Diagnosis not present

## 2020-04-30 DIAGNOSIS — Z885 Allergy status to narcotic agent status: Secondary | ICD-10-CM | POA: Insufficient documentation

## 2020-04-30 DIAGNOSIS — Z888 Allergy status to other drugs, medicaments and biological substances status: Secondary | ICD-10-CM | POA: Insufficient documentation

## 2020-04-30 DIAGNOSIS — Z79899 Other long term (current) drug therapy: Secondary | ICD-10-CM | POA: Diagnosis not present

## 2020-04-30 DIAGNOSIS — Z96641 Presence of right artificial hip joint: Secondary | ICD-10-CM | POA: Insufficient documentation

## 2020-04-30 DIAGNOSIS — Z9049 Acquired absence of other specified parts of digestive tract: Secondary | ICD-10-CM | POA: Diagnosis not present

## 2020-04-30 DIAGNOSIS — Q438 Other specified congenital malformations of intestine: Secondary | ICD-10-CM | POA: Diagnosis not present

## 2020-04-30 DIAGNOSIS — Z90710 Acquired absence of both cervix and uterus: Secondary | ICD-10-CM | POA: Insufficient documentation

## 2020-04-30 DIAGNOSIS — Z86010 Personal history of colonic polyps: Secondary | ICD-10-CM | POA: Diagnosis not present

## 2020-04-30 DIAGNOSIS — F418 Other specified anxiety disorders: Secondary | ICD-10-CM | POA: Diagnosis not present

## 2020-04-30 DIAGNOSIS — I10 Essential (primary) hypertension: Secondary | ICD-10-CM | POA: Diagnosis not present

## 2020-04-30 DIAGNOSIS — Z87891 Personal history of nicotine dependence: Secondary | ICD-10-CM | POA: Insufficient documentation

## 2020-04-30 DIAGNOSIS — Z1211 Encounter for screening for malignant neoplasm of colon: Secondary | ICD-10-CM | POA: Insufficient documentation

## 2020-04-30 HISTORY — PX: COLONOSCOPY WITH PROPOFOL: SHX5780

## 2020-04-30 SURGERY — COLONOSCOPY WITH PROPOFOL
Anesthesia: General

## 2020-04-30 MED ORDER — PROPOFOL 10 MG/ML IV BOLUS
INTRAVENOUS | Status: DC | PRN
  Administered 2020-04-30: 100 mg via INTRAVENOUS

## 2020-04-30 MED ORDER — LACTATED RINGERS IV SOLN: INTRAVENOUS | Status: DC

## 2020-04-30 MED ORDER — STERILE WATER FOR IRRIGATION IR SOLN
Status: DC | PRN
  Administered 2020-04-30: 1.5 mL

## 2020-04-30 MED ORDER — PROPOFOL 500 MG/50ML IV EMUL
INTRAVENOUS | Status: DC | PRN
  Administered 2020-04-30: 150 ug/kg/min via INTRAVENOUS

## 2020-04-30 MED ORDER — MIDAZOLAM HCL 2 MG/2ML IJ SOLN
INTRAMUSCULAR | Status: AC
  Filled 2020-04-30: qty 2

## 2020-04-30 MED ORDER — MIDAZOLAM HCL 2 MG/2ML IJ SOLN
INTRAMUSCULAR | Status: DC | PRN
  Administered 2020-04-30: 2 mg via INTRAVENOUS

## 2020-04-30 MED ORDER — LIDOCAINE HCL (CARDIAC) PF 100 MG/5ML IV SOSY
PREFILLED_SYRINGE | INTRAVENOUS | Status: DC | PRN
  Administered 2020-04-30: 50 mg via INTRAVENOUS

## 2020-04-30 MED ORDER — LACTATED RINGERS IV SOLN: INTRAVENOUS | Status: DC | PRN

## 2020-04-30 NOTE — Anesthesia Preprocedure Evaluation (Signed)
Anesthesia Evaluation  Patient identified by MRN, date of birth, ID band Patient awake    Reviewed: Allergy & Precautions, H&P , NPO status , Patient's Chart, lab work & pertinent test results, reviewed documented beta blocker date and time   Airway Mallampati: II  TM Distance: >3 FB Neck ROM: full    Dental no notable dental hx. (+) Teeth Intact   Pulmonary neg pulmonary ROS, former smoker,    Pulmonary exam normal breath sounds clear to auscultation       Cardiovascular Exercise Tolerance: Good hypertension, negative cardio ROS   Rhythm:regular Rate:Normal     Neuro/Psych  Headaches, PSYCHIATRIC DISORDERS Anxiety Depression  Neuromuscular disease    GI/Hepatic Neg liver ROS, GERD  Medicated,  Endo/Other  negative endocrine ROS  Renal/GU negative Renal ROS  negative genitourinary   Musculoskeletal   Abdominal   Peds  Hematology  (+) Blood dyscrasia, anemia ,   Anesthesia Other Findings   Reproductive/Obstetrics negative OB ROS                             Anesthesia Physical Anesthesia Plan  ASA: III  Anesthesia Plan: General   Post-op Pain Management:    Induction:   PONV Risk Score and Plan: Propofol infusion  Airway Management Planned:   Additional Equipment:   Intra-op Plan:   Post-operative Plan:   Informed Consent: I have reviewed the patients History and Physical, chart, labs and discussed the procedure including the risks, benefits and alternatives for the proposed anesthesia with the patient or authorized representative who has indicated his/her understanding and acceptance.     Dental Advisory Given  Plan Discussed with: CRNA  Anesthesia Plan Comments:         Anesthesia Quick Evaluation

## 2020-04-30 NOTE — H&P (Signed)
@LOGO @   Primary Care Physician:  [name], MD Primary Gastroenterologist:  Dr. Gala Romney  Pre-Procedure History & Physical: HPI:  Renee Waters is a 75 y.o. female here for srveillance TCS.  Past Medical History:  Diagnosis Date  . Acute cholangitis   . Allergy   . Anemia   . Anxiety   . Arthritis    Phreesia 03/18/2020  . Barrett's esophagus   . Cataract   . Chronic back pain   . Chronic neck pain   . Depression   . Genital herpes   . GERD (gastroesophageal reflux disease)   . H/O degenerative disc disease   . History of blood transfusion   . Hypertension   . Insomnia   . Lupus (systemic lupus erythematosus) ([place])   . Neuromuscular disorder ([place])   . Osteoarthritis   . S/P colonoscopy June 2005   normal, no polyps  . S/P endoscopy June 2005, Oct 2009   2005: short-segment Barrett's, 2009: short-segment Barrett's  . UTI (lower urinary tract infection) 11/2012    Past Surgical History:  Procedure Laterality Date  . ABDOMINAL HYSTERECTOMY    . BACK SURGERY    . BIOPSY N/A 03/20/2014   Procedure: BIOPSY;  Surgeon: Daneil Dolin, MD;  Location: [facility];  Service: Endoscopy;  Laterality: N/A;  . BIOPSY  09/14/2015   Procedure: BIOPSY;  Surgeon: Daneil Dolin, MD;  Location: [facility];  Service: Endoscopy;;  esophageal and gastric  . BIOPSY  07/23/2019   Procedure: BIOPSY;  Surgeon: Daneil Dolin, MD;  Location: [facility];  Service: Endoscopy;;  esophageal   . CARPAL TUNNEL RELEASE Left 2013  . cervical disectomy  2002  . CESAREAN SECTION N/A    Phreesia 03/18/2020  . CHOLECYSTECTOMY     with lysis of adhesions for sbo; "ruptured gallbladder".  . COLONOSCOPY  11/09/2011   RMR: Melanosis coli  . COLONOSCOPY WITH PROPOFOL N/A 09/14/2015   Dr. Gala Romney: diverticulosis, 43mm TA removed. next TCS 09/2020.   . DENTAL SURGERY  11/2015   multiple tooth extraction  . ESOPHAGOGASTRODUODENOSCOPY  11/29/2007   salmon-colored  tongue   longest stable at  3 cm, distal  esophagus as described previously status post biopsy/ Hiatal hernia, otherwise normal stomach D1 and D2  . ESOPHAGOGASTRODUODENOSCOPY  01/06/11   short segment Barrett's esophagus s/p bx/Hiatal hernia  . ESOPHAGOGASTRODUODENOSCOPY (EGD) WITH PROPOFOL N/A 03/20/2014   QQI:WLNLGXQJ distal esophagus short segment barrett's, bx with no dysplasia. next egd in 03/2017  . ESOPHAGOGASTRODUODENOSCOPY (EGD) WITH PROPOFOL N/A 09/14/2015   Dr. Gala Romney: Barrett's without dysplasia, gastritis benign bx, hiatal hernia. next EGD 09/2018.  Marland Kitchen ESOPHAGOGASTRODUODENOSCOPY (EGD) WITH PROPOFOL N/A 07/23/2019   Procedure: ESOPHAGOGASTRODUODENOSCOPY (EGD) WITH PROPOFOL;  Surgeon: Daneil Dolin, MD;  Location: [facility];  Service: Endoscopy;  Laterality: N/A;  3:00pm  . EYE SURGERY N/A    Phreesia 03/18/2020  . HERNIA REPAIR Right 07/2010   Dr. Zada Girt  . JOINT REPLACEMENT    . LAPAROSCOPIC CHOLECYSTECTOMY  2017   at [facility]  . POLYPECTOMY  09/14/2015   Procedure: POLYPECTOMY;  Surgeon: Daneil Dolin, MD;  Location: [facility];  Service: Endoscopy;;  ascending colon  . right hip replacement  07/2010   went back in sept 2012 to fix  . SHOULDER ARTHROSCOPY  2008   left  . SPINE SURGERY N/A    Phreesia 03/18/2020  . TOTAL HIP REVISION Right 12/17/2012   Procedure: RIGHT TOTAL HIP REVISION;  Surgeon: Rodman Key  Marian Sorrow, MD;  Location: [facility];  Service: Orthopedics;  Laterality: Right;  . WRIST SURGERY Right 2011   open reduction right wrist.    Prior to Admission medications   Medication Sig Start Date End Date Taking? Authorizing Provider  acyclovir (ZOVIRAX) 800 MG tablet TAKE 1 TABLET BY MOUTH 4 TIMES DAILY AS NEEDED FOR  BREAKOUTS Patient taking differently: Take 800 mg by mouth 4 (four) times daily as needed (breakouts). TAKE 1 TABLET BY MOUTH 4 TIMES DAILY AS NEEDED FOR  BREAKOUTS 01/30/20  Yes [name], MD  ALPRAZolam Duanne Moron) 0.5 MG tablet Take 1 tablet (0.5 mg total) by mouth at bedtime as needed  for anxiety. 06/14/20  Yes [name], MD  DIGESTIVE ENZYMES PO Take 1 capsule by mouth in the morning.   Yes [provider]  EPINEPHRINE 0.3 mg/0.3 mL IJ SOAJ injection INJECT 0.3 MLS INTO MUSCLE ONCE AS NEEDED Patient taking differently: Inject 0.3 mg into the muscle as needed for anaphylaxis. 10/04/16  Yes [name], MD  gabapentin (NEURONTIN) 300 MG capsule TAKE 1 CAPSULE BY MOUTH ONCE DAILY FOR  CHRONIC  PAIN Patient taking differently: Take 300 mg by mouth daily as needed (pain). 03/11/20  Yes [name], MD  hydrochlorothiazide (HYDRODIURIL) 25 MG tablet Take 1 tablet by mouth once daily Patient taking differently: Take 25 mg by mouth in the morning. 02/04/20  Yes [name], MD  losartan (COZAAR) 25 MG tablet Take 1 tablet by mouth once daily for blood pressure Patient taking differently: Take 25 mg by mouth in the morning. 03/09/20  Yes [name], MD  meloxicam (MOBIC) 7.5 MG tablet Take 1 tablet (7.5 mg total) by mouth daily. Patient taking differently: Take 7.5 mg by mouth daily as needed for pain (knee pain). 04/11/19  Yes Carole Civil, MD  NATURAL PSYLLIUM FIBER PO Take 2 Scoops by mouth daily. FiberWise --Mixed in 8 ounces of water   Yes [provider]  Oxycodone HCl 10 MG TABS Take 1 tablet (10 mg total) by mouth 4 (four) times daily. Patient taking differently: Take 10 mg by mouth 4 (four) times daily as needed (pain). 02/23/16  Yes [name], MD  pantoprazole (PROTONIX) 40 MG tablet Take 1 tablet (40 mg total) by mouth daily before breakfast. 11/15/19  Yes Carlis Stable, NP  Probiotic Product (EQL DAILY PROBIOTIC PO) Take 1 capsule by mouth daily.   Yes [provider]  temazepam (RESTORIL) 30 MG capsule TAKE 1 CAPSULE BY MOUTH AT BEDTIME AS NEEDED FOR SLEEP Patient taking differently: Take 30 mg by mouth at bedtime. 04/08/20  Yes [name], MD  tiZANidine (ZANAFLEX) 4 MG tablet Take 8 mg by  mouth 3 (three) times daily as needed (spasms). 08/05/19  Yes [name], MD  DULoxetine (CYMBALTA) 20 MG capsule Take 2 capsules (40 mg total) by mouth daily. Patient not taking: Reported on 04/15/2020 01/20/20   Norman Clay, MD    Allergies as of 03/25/2020 - Review Complete 03/24/2020  Allergen Reaction Noted  . Bee venom Swelling and Hives 06/12/2012  . Acetaminophen  09/16/2019  . Amlodipine  09/16/2019  . Tyloxapol  12/10/2018  . Other Itching, Rash, and Swelling 03/18/2020  . Oxycodone-acetaminophen Rash 01/26/2015    Family History  Problem Relation Age of Onset  . Hypertension Mother   . Stroke Mother   . Colon cancer Neg Hx   . Anesthesia problems Neg Hx   .  Hypotension Neg Hx   . Malignant hyperthermia Neg Hx   . Pseudochol deficiency Neg Hx   . Gastric cancer Neg Hx   . Esophageal cancer Neg Hx     Social History   Socioeconomic History  . Marital status: Married    Spouse name: louis  . Number of children: 4  . Years of education: 12+  . Highest education level: Some college, no degree  Occupational History  . Occupation: Press photographer  - retired  . Occupation: non profit  Tobacco Use  . Smoking status: Former Smoker    Packs/day: 0.25    Years: 25.00    Pack years: 6.25    Types: Cigarettes    Quit date: 02/07/2003    Years since quitting: 17.2  . Smokeless tobacco: Never Used  . Tobacco comment: quit in 2004  Vaping Use  . Vaping Use: Never used  Substance and Sexual Activity  . Alcohol use: No  . Drug use: No  . Sexual activity: Yes    Birth control/protection: Surgical  Other Topics Concern  . Not on file  Social History Narrative  . Not on file   Social Determinants of Health   Financial Resource Strain: Low Risk   . Difficulty of Paying Living Expenses: Not hard at all  Food Insecurity: No Food Insecurity  . Worried About Charity fundraiser in the Last Year: Never true  . Ran Out of Food in the Last Year: Never true   Transportation Needs: No Transportation Needs  . Lack of Transportation (Medical): No  . Lack of Transportation (Non-Medical): No  Physical Activity: Sufficiently Active  . Days of Exercise per Week: 7 days  . Minutes of Exercise per Session: 30 min  Stress: No Stress Concern Present  . Feeling of Stress : Not at all  Social Connections: Moderately Integrated  . Frequency of Communication with Friends and Family: More than three times a week  . Frequency of Social Gatherings with Friends and Family: Once a week  . Attends Religious Services: More than 4 times per year  . Active Member of Clubs or Organizations: No  . Attends Archivist Meetings: Never  . Marital Status: Married  Human resources officer Violence: Not At Risk  . Fear of Current or Ex-Partner: No  . Emotionally Abused: No  . Physically Abused: No  . Sexually Abused: No    Review of Systems: See HPI, otherwise negative ROS  Physical Exam: BP (!) 170/92   Pulse 75   Temp 98.1 F (36.7 C) (Oral)   Resp 10   SpO2 100%  General:   Alert,  Well-developed, well-nourished, pleasant and cooperative in NAD Neck:  Supple; no masses or thyromegaly. No significant cervical adenopathy. Lungs:  Clear throughout to auscultation.   No wheezes, crackles, or rhonchi. No acute distress. Heart:  Regular rate and rhythm; no murmurs, clicks, rubs,  or gallops. Abdomen: Non-distended, normal bowel sounds.  Soft and nontender without appreciable mass or hepatosplenomegaly.  Pulses:  Normal pulses noted. Extremities:  Without clubbing or edema.  Impression/Plan:  TCS today per plan. The risks, benefits, limitations, alternatives and imponderables have been reviewed with the patient. Questions have been answered. All parties are agreeable.      Notice: This dictation was prepared with Dragon dictation along with smaller phrase technology. Any transcriptional errors that result from this process are unintentional and may not be  corrected upon review.

## 2020-04-30 NOTE — Op Note (Signed)
[facility] Patient Name: Renee Waters Procedure Date: 04/30/2020 1:47 PM MRN: [id number] Date of Birth: [birth year] Attending MD: Norvel Richards , MD CSN: [id number] Age: 75 Admit Type: Outpatient Procedure:                Colonoscopy Indications:              High risk colon cancer surveillance: Personal                            history of colonic polyps Providers:                Norvel Richards, MD, Caprice Kluver, Kristine L.                            Risa Grill, Technician Referring MD:              Medicines:                Propofol per Anesthesia Complications:            No immediate complications. Estimated Blood Loss:     Estimated blood loss: none. Procedure:                Pre-Anesthesia Assessment:                           - Prior to the procedure, a History and Physical                            was performed, and patient medications and                            allergies were reviewed. The patient's tolerance of                            previous anesthesia was also reviewed. The risks                            and benefits of the procedure and the sedation                            options and risks were discussed with the patient.                            All questions were answered, and informed consent                            was obtained. Prior Anticoagulants: The patient has                            taken no previous anticoagulant or antiplatelet                            agents. ASA Grade Assessment: III - A patient with  severe systemic disease. After reviewing the risks                            and benefits, the patient was deemed in                            satisfactory condition to undergo the procedure.                           After obtaining informed consent, the colonoscope                            was passed under direct vision. Throughout the                            procedure, the  patient's blood pressure, pulse, and                            oxygen saturations were monitored continuously. The                            CF-[id number] ([id number]) scope was introduced through                            the anus and advanced to the the cecum, identified                            by appendiceal orifice and ileocecal valve. The                            colonoscopy was performed without difficulty. The                            patient tolerated the procedure well. The ileocecal                            valve, appendiceal orifice, and rectum were                            photographed. The entire colon was well visualized. Scope In: 2:03:50 PM Scope Out: 2:17:54 PM Scope Withdrawal Time: 0 hours 7 minutes 40 seconds  Total Procedure Duration: 0 hours 14 minutes 4 seconds  Findings:      The perianal and digital rectal examinations were normal. Redundant       colon. External abdominal pressure required to reach the cecum      A diffuse area of moderate melanosis was found in the ascending colon.      The exam was otherwise without abnormality on direct and retroflexion       views. Impression:               - Melanosis in the colon. Redundant colon                           - The examination was otherwise normal on direct  and retroflexion views.                           - No specimens collected. Moderate Sedation:      Moderate (conscious) sedation was personally administered by an       anesthesia professional. The following parameters were monitored: oxygen       saturation, heart rate, blood pressure, respiratory rate, EKG, adequacy       of pulmonary ventilation, and response to care. Recommendation:           - Patient has a contact number available for                            emergencies. The signs and symptoms of potential                            delayed complications were discussed with the                             patient. Return to normal activities tomorrow.                            Written discharge instructions were provided to the                            patient.                           - Advance diet as tolerated. No future colonoscopy                            recommended Procedure Code(s):        --- Professional ---                           [phone], Colonoscopy, flexible; diagnostic, including                            collection of specimen(s) by brushing or washing,                            when performed (separate procedure) Diagnosis Code(s):        --- Professional ---                           Z86.010, Personal history of colonic polyps                           K63.89, Other specified diseases of intestine CPT copyright 2019 American Medical Association. All rights reserved. The codes documented in this report are preliminary and upon coder review may  be revised to meet current compliance requirements. [name]. [name], MD Norvel Richards, MD 04/30/2020 2:27:05 PM This report has been signed electronically. Number of Addenda: 0

## 2020-04-30 NOTE — Transfer of Care (Signed)
Immediate Anesthesia Transfer of Care Note  Patient: Renee Waters  Procedure(s) Performed: COLONOSCOPY WITH PROPOFOL (N/A )  Patient Location: Short Stay  Anesthesia Type:General  Level of Consciousness: awake  Airway & Oxygen Therapy: Patient Spontanous Breathing  Post-op Assessment: Report given to RN and Post -op Vital signs reviewed and stable  Post vital signs: Reviewed and stable  Last Vitals:  Vitals Value Taken Time  BP    Temp    Pulse    Resp    SpO2      Last Pain:  Vitals:   04/30/20 1357  TempSrc:   PainSc: 5       Patients Stated Pain Goal: 6 (81/85/90 9311)  Complications: No complications documented.

## 2020-04-30 NOTE — Anesthesia Postprocedure Evaluation (Signed)
Anesthesia Post Note  Patient: Renee Waters  Procedure(s) Performed: COLONOSCOPY WITH PROPOFOL (N/A )  Patient location during evaluation: PACU Anesthesia Type: General Level of consciousness: awake Pain management: pain level controlled Vital Signs Assessment: post-procedure vital signs reviewed and stable Respiratory status: spontaneous breathing and respiratory function stable Cardiovascular status: blood pressure returned to baseline and stable Postop Assessment: no headache and no apparent nausea or vomiting Anesthetic complications: no   No complications documented.   Last Vitals:  Vitals:   04/30/20 1421 04/30/20 1426  BP: 122/74 (!) 155/84  Pulse: 79 74  Resp: 16 19  Temp: 36.9 C   SpO2: 98% 100%    Last Pain:  Vitals:   04/30/20 1426  TempSrc:   PainSc: 0-No pain                 Louann Sjogren

## 2020-04-30 NOTE — Discharge Instructions (Signed)
Colonoscopy Discharge Instructions  Read the instructions outlined below and refer to this sheet in the next few weeks. These discharge instructions provide you with general information on caring for yourself after you leave the hospital. Your doctor may also give you specific instructions. While your treatment has been planned according to the most current medical practices available, unavoidable complications occasionally occur. If you have any problems or questions after discharge, call Dr. Gala Romney at [phone]. ACTIVITY  You may resume your regular activity, but move at a slower pace for the next 24 hours.   Take frequent rest periods for the next 24 hours.   Walking will help get rid of the air and reduce the bloated feeling in your belly (abdomen).   No driving for 24 hours (because of the medicine (anesthesia) used during the test).    Do not sign any important legal documents or operate any machinery for 24 hours (because of the anesthesia used during the test).  NUTRITION  Drink plenty of fluids.   You may resume your normal diet as instructed by your doctor.   Begin with a light meal and progress to your normal diet. Heavy or fried foods are harder to digest and may make you feel sick to your stomach (nauseated).   Avoid alcoholic beverages for 24 hours or as instructed.  MEDICATIONS  You may resume your normal medications unless your doctor tells you otherwise.  WHAT YOU CAN EXPECT TODAY  Some feelings of bloating in the abdomen.   Passage of more gas than usual.   Spotting of blood in your stool or on the toilet paper.  IF YOU HAD POLYPS REMOVED DURING THE COLONOSCOPY:  No aspirin products for 7 days or as instructed.   No alcohol for 7 days or as instructed.   Eat a soft diet for the next 24 hours.  FINDING OUT THE RESULTS OF YOUR TEST Not all test results are available during your visit. If your test results are not back during the visit, make an appointment  with your caregiver to find out the results. Do not assume everything is normal if you have not heard from your caregiver or the medical facility. It is important for you to follow up on all of your test results.  SEEK IMMEDIATE MEDICAL ATTENTION IF:  You have more than a spotting of blood in your stool.   Your belly is swollen (abdominal distention).   You are nauseated or vomiting.   You have a temperature over 101.   You have abdominal pain or discomfort that is severe or gets worse throughout the day.    No polyps found today  A future colonoscopy is not recommended unless new symptoms develop  At patient request I called Phillis Knack at [phone] -got voicemail-left message     Monitored Anesthesia Care, Care After This sheet gives you information about how to care for yourself after your procedure. Your health care provider may also give you more specific instructions. If you have problems or questions, contact your health care provider. What can I expect after the procedure? After the procedure, it is common to have:  Tiredness.  Forgetfulness about what happened after the procedure.  Impaired judgment for important decisions.  Nausea or vomiting.  Some difficulty with balance. Follow these instructions at home: For the time period you were told by your health care provider:  Rest as needed.  Do not participate in activities where you could fall or become injured.  Do not  drive or use machinery.  Do not drink alcohol.  Do not take sleeping pills or medicines that cause drowsiness.  Do not make important decisions or sign legal documents.  Do not take care of children on your own.      Eating and drinking  Follow the diet that is recommended by your health care provider.  Drink enough fluid to keep your urine pale yellow.  If you vomit: ? Drink water, juice, or soup when you can drink without vomiting. ? Make sure you have little or no nausea  before eating solid foods. General instructions  Have a responsible adult stay with you for the time you are told. It is important to have someone help care for you until you are awake and alert.  Take over-the-counter and prescription medicines only as told by your health care provider.  If you have sleep apnea, surgery and certain medicines can increase your risk for breathing problems. Follow instructions from your health care provider about wearing your sleep device: ? Anytime you are sleeping, including during daytime naps. ? While taking prescription pain medicines, sleeping medicines, or medicines that make you drowsy.  Avoid smoking.  Keep all follow-up visits as told by your health care provider. This is important. Contact a health care provider if:  You keep feeling nauseous or you keep vomiting.  You feel light-headed.  You are still sleepy or having trouble with balance after 24 hours.  You develop a rash.  You have a fever.  You have redness or swelling around the IV site. Get help right away if:  You have trouble breathing.  You have new-onset confusion at home. Summary  For several hours after your procedure, you may feel tired. You may also be forgetful and have poor judgment.  Have a responsible adult stay with you for the time you are told. It is important to have someone help care for you until you are awake and alert.  Rest as told. Do not drive or operate machinery. Do not drink alcohol or take sleeping pills.  Get help right away if you have trouble breathing, or if you suddenly become confused. This information is not intended to replace advice given to you by your health care provider. Make sure you discuss any questions you have with your health care provider. Document Revised: 10/10/2019 Document Reviewed: 12/27/2018 Elsevier Patient Education  2021 [employer].

## 2020-05-04 ENCOUNTER — Other Ambulatory Visit: Payer: Self-pay | Admitting: Family Medicine

## 2020-05-07 ENCOUNTER — Encounter (HOSPITAL_COMMUNITY): Payer: Self-pay | Admitting: Internal Medicine

## 2020-05-11 DIAGNOSIS — H26491 Other secondary cataract, right eye: Secondary | ICD-10-CM | POA: Diagnosis not present

## 2020-05-25 DIAGNOSIS — H26492 Other secondary cataract, left eye: Secondary | ICD-10-CM | POA: Diagnosis not present

## 2020-05-27 ENCOUNTER — Other Ambulatory Visit: Payer: Self-pay

## 2020-05-27 ENCOUNTER — Ambulatory Visit (INDEPENDENT_AMBULATORY_CARE_PROVIDER_SITE_OTHER): Payer: Medicare Other | Admitting: Nurse Practitioner

## 2020-05-27 ENCOUNTER — Encounter: Payer: Self-pay | Admitting: Nurse Practitioner

## 2020-05-27 DIAGNOSIS — W57XXXA Bitten or stung by nonvenomous insect and other nonvenomous arthropods, initial encounter: Secondary | ICD-10-CM | POA: Diagnosis not present

## 2020-05-27 DIAGNOSIS — G8929 Other chronic pain: Secondary | ICD-10-CM

## 2020-05-27 DIAGNOSIS — M5440 Lumbago with sciatica, unspecified side: Secondary | ICD-10-CM

## 2020-05-27 DIAGNOSIS — S70361A Insect bite (nonvenomous), right thigh, initial encounter: Secondary | ICD-10-CM | POA: Diagnosis not present

## 2020-05-27 MED ORDER — PREDNISONE 10 MG PO TABS: ORAL_TABLET | ORAL | 0 refills | Status: DC

## 2020-05-27 MED ORDER — DOXYCYCLINE HYCLATE 100 MG PO TABS: 100.0000 mg | ORAL_TABLET | Freq: Two times a day (BID) | ORAL | 0 refills | Status: DC

## 2020-05-27 MED ORDER — PREDNISONE 10 MG PO TABS: ORAL_TABLET | ORAL | 0 refills | Status: AC

## 2020-05-27 NOTE — Assessment & Plan Note (Addendum)
-  she states that she sees rheumatology and ortho -Rx. Prednisone -she has tizanidine at home -if no improvement, would have her see specialist

## 2020-05-27 NOTE — Assessment & Plan Note (Signed)
-  Rx. Doxycycline -will get lab testing for lyme/RMSF as well as CBC/CMP -has discoid rash to upper chest and neck, but not likely this is RMSF d/t lack of fever and not a micropapular rash or distribution to palms and/or soles of feet

## 2020-05-27 NOTE — Addendum Note (Signed)
Addended by: Demetrius Revel on: 05/27/2020 04:35 PM   Modules accepted: Orders

## 2020-05-27 NOTE — Progress Notes (Signed)
Acute Office Visit  Subjective:    Patient ID: Renee Waters, female    DOB: 1946/01/23, 75 y.o.   MRN: [id number]  Chief Complaint  Patient presents with  . Leg Pain    R leg pain.   . Tick Removal    Pt removed a tick from R thigh x 1 week ago.     HPI Patient is in today for right leg pain that started after tick removal from her R thigh 1 week ago.  She states that she thought something remained in her wound, and she removed that last night. She has a rash to her upper chest and neck that looks like multiple small discs.  Her pain is in her lower back and radiates down her right leg.  Past Medical History:  Diagnosis Date  . Acute cholangitis   . Allergy   . Anemia   . Anxiety   . Arthritis    Phreesia 03/18/2020  . Barrett's esophagus   . Cataract   . Chronic back pain   . Chronic neck pain   . Depression   . Genital herpes   . GERD (gastroesophageal reflux disease)   . H/O degenerative disc disease   . History of blood transfusion   . Hypertension   . Insomnia   . Lupus (systemic lupus erythematosus) ([place])   . Neuromuscular disorder ([place])   . Osteoarthritis   . S/P colonoscopy June 2005   normal, no polyps  . S/P endoscopy June 2005, Oct 2009   2005: short-segment Barrett's, 2009: short-segment Barrett's  . UTI (lower urinary tract infection) 11/2012    Past Surgical History:  Procedure Laterality Date  . ABDOMINAL HYSTERECTOMY    . BACK SURGERY    . BIOPSY N/A 03/20/2014   Procedure: BIOPSY;  Surgeon: Daneil Dolin, MD;  Location: [facility];  Service: Endoscopy;  Laterality: N/A;  . BIOPSY  09/14/2015   Procedure: BIOPSY;  Surgeon: Daneil Dolin, MD;  Location: [facility];  Service: Endoscopy;;  esophageal and gastric  . BIOPSY  07/23/2019   Procedure: BIOPSY;  Surgeon: Daneil Dolin, MD;  Location: [facility];  Service: Endoscopy;;  esophageal   . CARPAL TUNNEL RELEASE Left 2013  . cervical disectomy  2002  . CESAREAN SECTION N/A    Phreesia  03/18/2020  . CHOLECYSTECTOMY     with lysis of adhesions for sbo; "ruptured gallbladder".  . COLONOSCOPY  11/09/2011   RMR: Melanosis coli  . COLONOSCOPY WITH PROPOFOL N/A 09/14/2015   Dr. Gala Romney: diverticulosis, 57mm TA removed. next TCS 09/2020.   Marland Kitchen COLONOSCOPY WITH PROPOFOL N/A 04/30/2020   Procedure: COLONOSCOPY WITH PROPOFOL;  Surgeon: Daneil Dolin, MD;  [facility]: [facility];  Service: Endoscopy;  Laterality: N/A;  PM (ASA 3)  . DENTAL SURGERY  11/2015   multiple tooth extraction  . ESOPHAGOGASTRODUODENOSCOPY  11/29/2007   salmon-colored  tongue   longest stable at  3 cm, distal esophagus as described previously status post biopsy/ Hiatal hernia, otherwise normal stomach D1 and D2  . ESOPHAGOGASTRODUODENOSCOPY  01/06/11   short segment Barrett's esophagus s/p bx/Hiatal hernia  . ESOPHAGOGASTRODUODENOSCOPY (EGD) WITH PROPOFOL N/A 03/20/2014   DJS:HFWYOVZC distal esophagus short segment barrett's, bx with no dysplasia. next egd in 03/2017  . ESOPHAGOGASTRODUODENOSCOPY (EGD) WITH PROPOFOL N/A 09/14/2015   Dr. Gala Romney: Barrett's without dysplasia, gastritis benign bx, hiatal hernia. next EGD 09/2018.  Marland Kitchen ESOPHAGOGASTRODUODENOSCOPY (EGD) WITH PROPOFOL N/A 07/23/2019   Procedure: ESOPHAGOGASTRODUODENOSCOPY (EGD) WITH PROPOFOL;  Surgeon: Daneil Dolin, MD;  [facility];  Service: Endoscopy;  Laterality: N/A;  3:00pm  . EYE SURGERY N/A    Phreesia 03/18/2020  . HERNIA REPAIR Right 07/2010   Dr. Zada Girt  . JOINT REPLACEMENT    . LAPAROSCOPIC CHOLECYSTECTOMY  2017   at [facility]  . POLYPECTOMY  09/14/2015   Procedure: POLYPECTOMY;  Surgeon: Daneil Dolin, MD;  Location: [facility];  Service: Endoscopy;;  ascending colon  . right hip replacement  07/2010   went back in sept 2012 to fix  . SHOULDER ARTHROSCOPY  2008   left  . SPINE SURGERY N/A    Phreesia 03/18/2020  . TOTAL HIP REVISION Right 12/17/2012   Procedure: RIGHT TOTAL HIP REVISION;  Surgeon: Mauri Pole, MD;  Location:  [facility];  Service: Orthopedics;  Laterality: Right;  . WRIST SURGERY Right 2011   open reduction right wrist.    Family History  Problem Relation Age of Onset  . Hypertension Mother   . Stroke Mother   . Colon cancer Neg Hx   . Anesthesia problems Neg Hx   . Hypotension Neg Hx   . Malignant hyperthermia Neg Hx   . Pseudochol deficiency Neg Hx   . Gastric cancer Neg Hx   . Esophageal cancer Neg Hx     Social History   Socioeconomic History  . Marital status: Married    Spouse name: louis  . Number of children: 4  . Years of education: 12+  . Highest education level: Some college, no degree  Occupational History  . Occupation: Press photographer  - retired  . Occupation: non profit  Tobacco Use  . Smoking status: Former Smoker    Packs/day: 0.25    Years: 25.00    Pack years: 6.25    Types: Cigarettes    Quit date: 02/07/2003    Years since quitting: 17.3  . Smokeless tobacco: Never Used  . Tobacco comment: quit in 2004  Vaping Use  . Vaping Use: Never used  Substance and Sexual Activity  . Alcohol use: No  . Drug use: No  . Sexual activity: Yes    Birth control/protection: Surgical  Other Topics Concern  . Not on file  Social History Narrative  . Not on file   Social Determinants of Health   Financial Resource Strain: Low Risk   . Difficulty of Paying Living Expenses: Not hard at all  Food Insecurity: No Food Insecurity  . Worried About Charity fundraiser in the Last Year: Never true  . Ran Out of Food in the Last Year: Never true  Transportation Needs: No Transportation Needs  . Lack of Transportation (Medical): No  . Lack of Transportation (Non-Medical): No  Physical Activity: Sufficiently Active  . Days of Exercise per Week: 7 days  . Minutes of Exercise per Session: 30 min  Stress: No Stress Concern Present  . Feeling of Stress : Not at all  Social Connections: Moderately Integrated  . Frequency of Communication with Friends and Family: More than three  times a week  . Frequency of Social Gatherings with Friends and Family: Once a week  . Attends Religious Services: More than 4 times per year  . Active Member of Clubs or Organizations: No  . Attends Archivist Meetings: Never  . Marital Status: Married  Human resources officer Violence: Not At Risk  . Fear of Current or Ex-Partner: No  . Emotionally Abused: No  . Physically Abused: No  .  Sexually Abused: No    Outpatient Medications Prior to Visit  Medication Sig Dispense Refill  . acyclovir (ZOVIRAX) 800 MG tablet TAKE 1 TABLET BY MOUTH 4 TIMES DAILY AS NEEDED FOR  BREAKOUTS (Patient taking differently: Take 800 mg by mouth 4 (four) times daily as needed (breakouts). TAKE 1 TABLET BY MOUTH 4 TIMES DAILY AS NEEDED FOR  BREAKOUTS) 30 tablet 0  . [START ON 06/14/2020] ALPRAZolam (XANAX) 0.5 MG tablet Take 1 tablet (0.5 mg total) by mouth at bedtime as needed for anxiety. 30 tablet 3  . DIGESTIVE ENZYMES PO Take 1 capsule by mouth in the morning.    Marland Kitchen EPINEPHRINE 0.3 mg/0.3 mL IJ SOAJ injection INJECT 0.3 MLS INTO MUSCLE ONCE AS NEEDED (Patient taking differently: Inject 0.3 mg into the muscle as needed for anaphylaxis.) 1 Device 2  . gabapentin (NEURONTIN) 300 MG capsule TAKE 1 CAPSULE BY MOUTH ONCE DAILY FOR  CHRONIC  PAIN (Patient taking differently: Take 300 mg by mouth daily as needed (pain).) 90 capsule 0  . hydrochlorothiazide (HYDRODIURIL) 25 MG tablet Take 1 tablet by mouth once daily (Patient taking differently: Take 25 mg by mouth in the morning.) 90 tablet 0  . losartan (COZAAR) 25 MG tablet Take 1 tablet by mouth once daily for blood pressure 30 tablet 0  . meloxicam (MOBIC) 7.5 MG tablet Take 1 tablet (7.5 mg total) by mouth daily. (Patient taking differently: Take 7.5 mg by mouth daily as needed for pain (knee pain).) 60 tablet 2  . NATURAL PSYLLIUM FIBER PO Take 2 Scoops by mouth daily. FiberWise --Mixed in 8 ounces of water    . Oxycodone HCl 10 MG TABS Take 1 tablet (10 mg  total) by mouth 4 (four) times daily. (Patient taking differently: Take 10 mg by mouth 4 (four) times daily as needed (pain).) 56 tablet 0  . pantoprazole (PROTONIX) 40 MG tablet Take 1 tablet (40 mg total) by mouth daily before breakfast. 90 tablet 3  . Probiotic Product (EQL DAILY PROBIOTIC PO) Take 1 capsule by mouth daily.    . temazepam (RESTORIL) 30 MG capsule TAKE 1 CAPSULE BY MOUTH AT BEDTIME AS NEEDED FOR SLEEP (Patient taking differently: Take 30 mg by mouth at bedtime.) 30 capsule 0  . tiZANidine (ZANAFLEX) 4 MG tablet Take 8 mg by mouth 3 (three) times daily as needed (spasms). 180 tablet 0   No facility-administered medications prior to visit.    Allergies  Allergen Reactions  . Bee Venom Swelling and Hives  . Acetaminophen     itches  . Amlodipine     Hair loss  . Tyloxapol   . Other Itching, Rash and Swelling  . Oxycodone-Acetaminophen Rash    Pt states, "this gives her a rash, but at home she takes oxycodone for pain relief" Pt states, "this gives her a rash, but at home she takes oxycodone for pain relief"    Review of Systems  Constitutional: Negative.   Respiratory: Negative.   Cardiovascular: Negative.   Musculoskeletal: Positive for back pain and myalgias.       Right leg pain, worse with hip extension       Objective:    Physical Exam Constitutional:      Appearance: Normal appearance.  Cardiovascular:     Rate and Rhythm: Normal rate and regular rhythm.     Pulses: Normal pulses.     Heart sounds: Normal heart sounds.  Pulmonary:     Effort: Pulmonary effort is normal.  Breath sounds: Normal breath sounds.  Musculoskeletal:     Comments: Positive right straight leg raise  Skin:    Comments: Rash- multiple discs to upper chest and neck  Neurological:     Mental Status: She is alert.     BP (!) 172/78   Pulse 72   Temp 99.4 F (37.4 C)   Resp 20   Ht 5\' 5"  (1.651 m)   Wt 119 lb (54 kg)   SpO2 96%   BMI 19.80 kg/m  Wt Readings from  Last 3 Encounters:  05/27/20 119 lb (54 kg)  04/28/20 120 lb (54.4 kg)  03/24/20 120 lb 3.2 oz (54.5 kg)    There are no preventive care reminders to display for this patient.  There are no preventive care reminders to display for this patient.   Lab Results  Component Value Date   TSH 1.080 09/16/2019   Lab Results  Component Value Date   WBC 6.0 04/28/2020   HGB 10.7 (L) 04/28/2020   HCT 32.3 (L) 04/28/2020   MCV 95.6 04/28/2020   PLT 369 04/28/2020   Lab Results  Component Value Date   NA 136 04/28/2020   K 3.8 04/28/2020   CO2 26 04/28/2020   GLUCOSE 110 (H) 04/28/2020   BUN 24 (H) 04/28/2020   CREATININE 1.11 (H) 04/28/2020   BILITOT 0.5 11/28/2018   ALKPHOS 98 11/28/2018   AST 16 11/28/2018   ALT 9 11/28/2018   PROT 7.4 11/28/2018   ALBUMIN 4.2 11/28/2018   CALCIUM 8.6 (L) 04/28/2020   ANIONGAP 8 04/28/2020   Lab Results  Component Value Date   CHOL 145 11/28/2018   Lab Results  Component Value Date   HDL 58 11/28/2018   Lab Results  Component Value Date   LDLCALC 72 11/28/2018   Lab Results  Component Value Date   TRIG 74 11/28/2018   Lab Results  Component Value Date   CHOLHDL 2.4 12/07/2016   Lab Results  Component Value Date   HGBA1C 6.0 (H) 11/03/2014       Assessment & Plan:   Problem List Items Addressed This Visit      Nervous and Auditory   Chronic midline low back pain with sciatica    -she states that she sees rheumatology and ortho -Rx. Prednisone -she has tizanidine at home -if no improvement, would have her see specialist      Relevant Medications   predniSONE (DELTASONE) 10 MG tablet     Musculoskeletal and Integument   Tick bite    -Rx. Doxycycline -will get lab testing for lyme/RMSF as well as CBC/CMP -has discoid rash to upper chest and neck, but not likely this is RMSF d/t lack of fever and not a micropapular rash or distribution to palms and/or soles of feet      Relevant Orders   CBC with  Differential/Platelet   Lyme Ab (IgG/M) + RMSF( IgG/M)   Basic metabolic panel       Meds ordered this encounter  Medications  . doxycycline (VIBRA-TABS) 100 MG tablet    Sig: Take 1 tablet (100 mg total) by mouth 2 (two) times daily.    Dispense:  20 tablet    Refill:  0  . predniSONE (DELTASONE) 10 MG tablet    Sig: Take 6 tablets (60 mg total) by mouth daily with breakfast for 1 day, THEN 5 tablets (50 mg total) daily with breakfast for 1 day, THEN 4 tablets (40 mg total) daily with  breakfast for 1 day, THEN 3 tablets (30 mg total) daily with breakfast for 1 day, THEN 2 tablets (20 mg total) daily with breakfast for 1 day, THEN 1 tablet (10 mg total) daily with breakfast for 1 day.    Dispense:  21 tablet    Refill:  0     Noreene Larsson, NP

## 2020-05-28 ENCOUNTER — Telehealth: Payer: Self-pay

## 2020-05-28 NOTE — Telephone Encounter (Signed)
Pt informed that tick borne illness titers have not resulted yet. Pt states she is having severe pain in her hip and she is crying. Pt is currently taking Doxycycline and Prednisone without relief. Pt wanted me to direct this message to you to let you know what is going on. I advised pt if she is in severe pain she should not hesitate to go to the ER for imaging on her hip.

## 2020-05-28 NOTE — Progress Notes (Signed)
The labs that have resulted look good. Her RBC and hemoglobin have increased, but the lyme and RMSF tests have not resulted yet.

## 2020-05-28 NOTE — Telephone Encounter (Signed)
Patient called left voice mail please return her call today. [phone] about her labs.

## 2020-05-29 NOTE — Telephone Encounter (Signed)
Noted and I agree with advice given

## 2020-06-03 ENCOUNTER — Other Ambulatory Visit: Payer: Self-pay | Admitting: Nurse Practitioner

## 2020-06-03 ENCOUNTER — Other Ambulatory Visit: Payer: Self-pay | Admitting: Family Medicine

## 2020-06-03 DIAGNOSIS — S70361A Insect bite (nonvenomous), right thigh, initial encounter: Secondary | ICD-10-CM

## 2020-06-03 LAB — LYME, WESTERN BLOT, SERUM (REFLEXED)
IgG P18 Ab.: ABSENT
IgG P23 Ab.: ABSENT
IgG P28 Ab.: ABSENT
IgG P30 Ab.: ABSENT
IgG P39 Ab.: ABSENT
IgG P45 Ab.: ABSENT
IgG P58 Ab.: ABSENT
IgG P66 Ab.: ABSENT
IgG P93 Ab.: ABSENT
IgM P23 Ab.: ABSENT
IgM P39 Ab.: ABSENT
IgM P41 Ab.: ABSENT
Lyme IgG Wb: NEGATIVE
Lyme IgM Wb: NEGATIVE

## 2020-06-03 LAB — CBC WITH DIFFERENTIAL/PLATELET
Basophils Absolute: 0 10*3/uL (ref 0.0–0.2)
Basos: 1 %
EOS (ABSOLUTE): 0.2 10*3/uL (ref 0.0–0.4)
Eos: 2 %
Hematocrit: 35.3 % (ref 34.0–46.6)
Hemoglobin: 11.9 g/dL (ref 11.1–15.9)
Immature Grans (Abs): 0.1 10*3/uL (ref 0.0–0.1)
Immature Granulocytes: 1 %
Lymphocytes Absolute: 1.7 10*3/uL (ref 0.7–3.1)
Lymphs: 19 %
MCH: 30.7 pg (ref 26.6–33.0)
MCHC: 33.7 g/dL (ref 31.5–35.7)
MCV: 91 fL (ref 79–97)
Monocytes Absolute: 1.4 10*3/uL — ABNORMAL HIGH (ref 0.1–0.9)
Monocytes: 16 %
Neutrophils Absolute: 5.3 10*3/uL (ref 1.4–7.0)
Neutrophils: 61 %
Platelets: 412 10*3/uL (ref 150–450)
RBC: 3.87 x10E6/uL (ref 3.77–5.28)
RDW: 14.1 % (ref 11.7–15.4)
WBC: 8.7 10*3/uL (ref 3.4–10.8)

## 2020-06-03 LAB — BASIC METABOLIC PANEL
BUN/Creatinine Ratio: 14 (ref 12–28)
BUN: 15 mg/dL (ref 8–27)
CO2: 24 mmol/L (ref 20–29)
Calcium: 9.3 mg/dL (ref 8.7–10.3)
Chloride: 102 mmol/L (ref 96–106)
Creatinine, Ser: 1.04 mg/dL — ABNORMAL HIGH (ref 0.57–1.00)
Glucose: 79 mg/dL (ref 65–99)
Potassium: 4.2 mmol/L (ref 3.5–5.2)
Sodium: 141 mmol/L (ref 134–144)
eGFR: 56 mL/min/{1.73_m2} — ABNORMAL LOW (ref >59)

## 2020-06-03 LAB — LYMEAB(IGG/M)+RMSF(IGG/M)
LYME DISEASE AB, QUANT, IGM: 1.66 index — ABNORMAL HIGH (ref 0.00–0.79)
Lyme IgG/IgM Ab: 1.34 {ISR} — ABNORMAL HIGH (ref 0.00–0.90)
RMSF IgG: NEGATIVE
RMSF IgM: 6.14 index — ABNORMAL HIGH (ref 0.00–0.89)

## 2020-06-03 NOTE — Progress Notes (Signed)
Her lyme and [facility] Spotted Fever tests both came back positive, and that seems exceedingly rare. I will get her in with infectious disease, and they may do additional testing. The doxycycline should help get rid of either of these, so make sure she is taking it BID for the whole 10 days.

## 2020-06-04 ENCOUNTER — Other Ambulatory Visit: Payer: Self-pay | Admitting: Family Medicine

## 2020-06-04 ENCOUNTER — Other Ambulatory Visit: Payer: Self-pay | Admitting: Nurse Practitioner

## 2020-06-04 MED ORDER — DOXYCYCLINE HYCLATE 100 MG PO TABS: 100.0000 mg | ORAL_TABLET | Freq: Two times a day (BID) | ORAL | 0 refills | Status: DC

## 2020-06-04 MED ORDER — LOSARTAN POTASSIUM 25 MG PO TABS: 25.0000 mg | ORAL_TABLET | Freq: Every day | ORAL | 0 refills | Status: DC

## 2020-06-04 MED ORDER — ALPRAZOLAM 0.5 MG PO TABS: 0.5000 mg | ORAL_TABLET | Freq: Every day | ORAL | 0 refills | Status: DC

## 2020-06-05 ENCOUNTER — Encounter: Payer: Self-pay | Admitting: Family Medicine

## 2020-06-08 ENCOUNTER — Other Ambulatory Visit: Payer: Self-pay | Admitting: Nurse Practitioner

## 2020-06-08 ENCOUNTER — Telehealth: Payer: Self-pay

## 2020-06-08 MED ORDER — NYSTATIN 100000 UNIT/ML MT SUSP: 5.0000 mL | Freq: Four times a day (QID) | OROMUCOSAL | 0 refills | Status: DC

## 2020-06-08 NOTE — Telephone Encounter (Signed)
Pt informed

## 2020-06-08 NOTE — Telephone Encounter (Signed)
Pt is calling she has a sore throat and mouth, she wants a nurse to call her -

## 2020-06-08 NOTE — Telephone Encounter (Signed)
Pt says she is still having the sore throat and now has white patches all over her tongue she says it's thrush, has had this before and is thinking it came from the antibiotic that she's still taking. Wants to know if you can send something in for that? Please advise.

## 2020-06-08 NOTE — Telephone Encounter (Signed)
Sent magic mouthwash for her

## 2020-06-10 ENCOUNTER — Other Ambulatory Visit: Payer: Self-pay

## 2020-06-10 ENCOUNTER — Ambulatory Visit (INDEPENDENT_AMBULATORY_CARE_PROVIDER_SITE_OTHER): Payer: Medicare Other | Admitting: Internal Medicine

## 2020-06-10 DIAGNOSIS — J029 Acute pharyngitis, unspecified: Secondary | ICD-10-CM | POA: Diagnosis not present

## 2020-06-10 DIAGNOSIS — S70361A Insect bite (nonvenomous), right thigh, initial encounter: Secondary | ICD-10-CM | POA: Diagnosis not present

## 2020-06-10 DIAGNOSIS — W57XXXA Bitten or stung by nonvenomous insect and other nonvenomous arthropods, initial encounter: Secondary | ICD-10-CM | POA: Diagnosis not present

## 2020-06-10 NOTE — Progress Notes (Signed)
[facility]      Reason for Consult: recent tick exposure    Referring Physician: Richardean Canal, NP    Patient ID: Renee Waters, female    DOB: 23-Jun-1945, 75 y.o.   MRN: [id number]  HPI:   She is here for evaluation after an exposure to a tick.   She was seen at her PCPs office on 05/27/20 where she went with symptoms of a sore throat and gums for over a week.  She had been unable to use her dentures due to the tenderness of her gums.  Theses symptoms have persisted.  She also had mentioned two ticks that she pulled off her right leg around that time as well.  She was having no fever, no headache.  She had noted a rash on her chest that was present long before the tick exposure.   Lyme serology checked and is screen positive, confirmatory Western blot negative for Lyme. RMSF serology checked and positive IgM.  She was told she is positive for both.   Past Medical History:  Diagnosis Date  . Acute cholangitis   . Allergy   . Anemia   . Anxiety   . Arthritis    Phreesia 03/18/2020  . Barrett's esophagus   . Cataract   . Chronic back pain   . Chronic neck pain   . Depression   . Genital herpes   . GERD (gastroesophageal reflux disease)   . H/O degenerative disc disease   . History of blood transfusion   . Hypertension   . Insomnia   . Lupus (systemic lupus erythematosus) ([place])   . Neuromuscular disorder ([place])   . Osteoarthritis   . S/P colonoscopy June 2005   normal, no polyps  . S/P endoscopy June 2005, Oct 2009   2005: short-segment Barrett's, 2009: short-segment Barrett's  . UTI (lower urinary tract infection) 11/2012    Prior to Admission medications   Medication Sig Start Date End Date Taking? Authorizing Provider  acyclovir (ZOVIRAX) 800 MG tablet TAKE 1 TABLET BY MOUTH 4 TIMES DAILY AS NEEDED FOR  BREAKOUTS Patient taking differently: Take 800 mg by mouth 4 (four) times daily as needed (breakouts). TAKE 1 TABLET BY MOUTH 4 TIMES DAILY AS  NEEDED FOR  BREAKOUTS 01/30/20   [name], MD  ALPRAZolam Duanne Moron) 0.5 MG tablet Take 1 tablet (0.5 mg total) by mouth at bedtime. 06/04/20   Noreene Larsson, NP  DIGESTIVE ENZYMES PO Take 1 capsule by mouth in the morning.    [provider]  doxycycline (VIBRA-TABS) 100 MG tablet Take 1 tablet (100 mg total) by mouth 2 (two) times daily. 06/04/20   [name], MD  EPINEPHRINE 0.3 mg/0.3 mL IJ SOAJ injection INJECT 0.3 MLS INTO MUSCLE ONCE AS NEEDED Patient taking differently: Inject 0.3 mg into the muscle as needed for anaphylaxis. 10/04/16   [name], MD  gabapentin (NEURONTIN) 300 MG capsule TAKE 1 CAPSULE BY MOUTH ONCE DAILY FOR  CHRONIC  PAIN Patient taking differently: Take 300 mg by mouth daily as needed (pain). 03/11/20   [name], MD  hydrochlorothiazide (HYDRODIURIL) 25 MG tablet Take 1 tablet by mouth once daily Patient taking differently: Take 25 mg by mouth in the morning. 02/04/20   [name], MD  losartan (COZAAR) 25 MG tablet Take 1 tablet (25 mg total) by mouth daily. for blood pressure 06/04/20   Noreene Larsson, NP  magic mouthwash (nystatin, hydrocortisone, diphenhydrAMINE)  suspension Swish and swallow 5 mLs 4 (four) times daily. 06/08/20   Noreene Larsson, NP  meloxicam (MOBIC) 7.5 MG tablet Take 1 tablet (7.5 mg total) by mouth daily. Patient taking differently: Take 7.5 mg by mouth daily as needed for pain (knee pain). 04/11/19   Carole Civil, MD  NATURAL PSYLLIUM FIBER PO Take 2 Scoops by mouth daily. FiberWise --Mixed in 8 ounces of water    [provider]  Oxycodone HCl 10 MG TABS Take 1 tablet (10 mg total) by mouth 4 (four) times daily. Patient taking differently: Take 10 mg by mouth 4 (four) times daily as needed (pain). 02/23/16   [name], MD  pantoprazole (PROTONIX) 40 MG tablet Take 1 tablet (40 mg total) by mouth daily before breakfast. 11/15/19   Carlis Stable, NP  Probiotic Product (EQL  DAILY PROBIOTIC PO) Take 1 capsule by mouth daily.    [provider]  temazepam (RESTORIL) 30 MG capsule TAKE 1 CAPSULE BY MOUTH AT BEDTIME AS NEEDED FOR SLEEP Patient taking differently: Take 30 mg by mouth at bedtime. 04/08/20   [name], MD  tiZANidine (ZANAFLEX) 4 MG tablet Take 8 mg by mouth 3 (three) times daily as needed (spasms). 08/05/19   [name], MD    Allergies  Allergen Reactions  . Bee Venom Swelling and Hives  . Acetaminophen     itches  . Amlodipine     Hair loss  . Tyloxapol   . Other Itching, Rash and Swelling  . Oxycodone-Acetaminophen Rash    Pt states, "this gives her a rash, but at home she takes oxycodone for pain relief" Pt states, "this gives her a rash, but at home she takes oxycodone for pain relief"    Social History   Tobacco Use  . Smoking status: Former Smoker    Packs/day: 0.25    Years: 25.00    Pack years: 6.25    Types: Cigarettes    Quit date: 02/07/2003    Years since quitting: 17.3  . Smokeless tobacco: Never Used  . Tobacco comment: quit in 2004  Vaping Use  . Vaping Use: Never used  Substance Use Topics  . Alcohol use: No  . Drug use: No    Family History  Problem Relation Age of Onset  . Hypertension Mother   . Stroke Mother   . Colon cancer Neg Hx   . Anesthesia problems Neg Hx   . Hypotension Neg Hx   . Malignant hyperthermia Neg Hx   . Pseudochol deficiency Neg Hx   . Gastric cancer Neg Hx   . Esophageal cancer Neg Hx    Review of Systems  Constitutional: negative for fevers, chills and weight loss Gastrointestinal: negative for diarrhea Integument/breast: negative for rash Musculoskeletal: negative for myalgias and arthralgias All other systems reviewed and are negative    Constitutional: in no apparent distress There were no vitals filed for this visit. EYES: anicteric Cardiovascular: Cor RRR Respiratory: clear Musculoskeletal: no pedal edema noted Skin: multiple areas on her  chest that are chronic in nature  Labs: Lab Results  Component Value Date   WBC 8.7 05/27/2020   HGB 11.9 05/27/2020   HCT 35.3 05/27/2020   MCV 91 05/27/2020   PLT 412 05/27/2020    Lab Results  Component Value Date   CREATININE 1.04 (H) 05/27/2020   BUN 15 05/27/2020   NA 141 05/27/2020   K 4.2 05/27/2020   CL 102 05/27/2020  CO2 24 05/27/2020    Lab Results  Component Value Date   ALT 9 11/28/2018   AST 16 11/28/2018   ALKPHOS 98 11/28/2018   BILITOT 0.5 11/28/2018   INR 1.35 08/20/2017     Assessment: asymptomatic tick exposure.  I discussed with her risks of infection from ticks, typical symptoms of tick exposure such as headache, high fever and rash/spots for RMSF and target lesions, arthritis, facial droop for Lyme disease.  I also discussed the lack of endemicity of Lyme in [place] and the negative testing.   For Lyme, she has no symptoms and testing not indicated, no treatment indicated; testing done and is negative. For RMSF, she has no symptoms as described above, testing not indicated, treatment not indicated. Testing done and is positive.  I discussed with her the issue  testing when no symptoms present and introduced the concept of low pretest probability and higher chance of false positives when testing done without symptoms and in general testing should be avoided.  I have advised her to stop the doxycycline as this is likely contributing to her overall malaise.    She will return to her PCP to discuss the original issue of sore throat  Plan: 1) stop antibiotics Return as needed  Thanks for the consultation

## 2020-06-11 ENCOUNTER — Encounter: Payer: Self-pay | Admitting: Family Medicine

## 2020-06-11 ENCOUNTER — Telehealth: Payer: Self-pay

## 2020-06-11 ENCOUNTER — Encounter: Payer: Self-pay | Admitting: Internal Medicine

## 2020-06-11 NOTE — Telephone Encounter (Signed)
Patient called still weak, dr told her to stop the medicine. (tick bite)  Please contact patient [phone].

## 2020-06-15 NOTE — Telephone Encounter (Signed)
Called pt; left vm

## 2020-06-15 NOTE — Telephone Encounter (Signed)
Called pt no answer °

## 2020-06-16 NOTE — Telephone Encounter (Signed)
Called again and had to leave voicemail

## 2020-06-17 DIAGNOSIS — G894 Chronic pain syndrome: Secondary | ICD-10-CM | POA: Diagnosis not present

## 2020-06-17 DIAGNOSIS — Z79891 Long term (current) use of opiate analgesic: Secondary | ICD-10-CM | POA: Diagnosis not present

## 2020-06-17 DIAGNOSIS — M47812 Spondylosis without myelopathy or radiculopathy, cervical region: Secondary | ICD-10-CM | POA: Diagnosis not present

## 2020-06-17 DIAGNOSIS — M47816 Spondylosis without myelopathy or radiculopathy, lumbar region: Secondary | ICD-10-CM | POA: Diagnosis not present

## 2020-06-22 NOTE — Telephone Encounter (Signed)
I have tried to reach her but have to leave messages. She needs an appt with provider if she is still weak and was told to stop medication for tick illness

## 2020-06-23 NOTE — Telephone Encounter (Signed)
Left voicemail on home and mobile numbers to return call to schedule an appointment.

## 2020-06-23 NOTE — Telephone Encounter (Signed)
Patient return called she is doing much better, no appointment is needed.

## 2020-07-07 ENCOUNTER — Other Ambulatory Visit: Payer: Self-pay | Admitting: Family Medicine

## 2020-07-09 ENCOUNTER — Telehealth (INDEPENDENT_AMBULATORY_CARE_PROVIDER_SITE_OTHER): Payer: Medicare Other | Admitting: Nurse Practitioner

## 2020-07-09 ENCOUNTER — Encounter: Payer: Self-pay | Admitting: Nurse Practitioner

## 2020-07-09 ENCOUNTER — Other Ambulatory Visit: Payer: Self-pay

## 2020-07-09 VITALS — Temp 99.1°F | Ht 65.0 in | Wt 119.0 lb

## 2020-07-09 DIAGNOSIS — J069 Acute upper respiratory infection, unspecified: Secondary | ICD-10-CM | POA: Diagnosis not present

## 2020-07-09 HISTORY — DX: Acute upper respiratory infection, unspecified: J06.9

## 2020-07-09 MED ORDER — AMOXICILLIN-POT CLAVULANATE 875-125 MG PO TABS: 1.0000 | ORAL_TABLET | Freq: Two times a day (BID) | ORAL | 0 refills | Status: DC

## 2020-07-09 NOTE — Assessment & Plan Note (Addendum)
-  ongoing x 1 week -Rx. augmentin -we discussed prednisone, but she was concerned since she had a dose in April, so no prednisone ordered -RTC in 1 week if no improvement

## 2020-07-09 NOTE — Progress Notes (Signed)
Acute Office Visit  Subjective:    Patient ID: Renee Waters, female    DOB: 08-28-1945, 75 y.o.   MRN: [id number]  Chief Complaint  Patient presents with  . Sore Throat    Ongoing x1 week ago.  . Sinusitis    Ongoing x1 week ago. Has improved some.  Marland Kitchen Hoarse    Ongoing x1 week ago.    HPI Patient is in today for sick visit. Symptoms started a week ago. Trouble swallowing and swollen glands.  She has not tried OTC medications for her symptoms.  Past Medical History:  Diagnosis Date  . Acute cholangitis   . Allergy   . Anemia   . Anxiety   . Arthritis    Phreesia 03/18/2020  . Barrett's esophagus   . Cataract   . Chronic back pain   . Chronic neck pain   . Depression   . Genital herpes   . GERD (gastroesophageal reflux disease)   . H/O degenerative disc disease   . History of blood transfusion   . Hypertension   . Insomnia   . Lupus (systemic lupus erythematosus) ([place])   . Neuromuscular disorder ([place])   . Osteoarthritis   . S/P colonoscopy June 2005   normal, no polyps  . S/P endoscopy June 2005, Oct 2009   2005: short-segment Barrett's, 2009: short-segment Barrett's  . UTI (lower urinary tract infection) 11/2012    Past Surgical History:  Procedure Laterality Date  . ABDOMINAL HYSTERECTOMY    . BACK SURGERY    . BIOPSY N/A 03/20/2014   Procedure: BIOPSY;  Surgeon: Daneil Dolin, MD;  Location: [facility];  Service: Endoscopy;  Laterality: N/A;  . BIOPSY  09/14/2015   Procedure: BIOPSY;  Surgeon: Daneil Dolin, MD;  Location: [facility];  Service: Endoscopy;;  esophageal and gastric  . BIOPSY  07/23/2019   Procedure: BIOPSY;  Surgeon: Daneil Dolin, MD;  Location: [facility];  Service: Endoscopy;;  esophageal   . CARPAL TUNNEL RELEASE Left 2013  . cervical disectomy  2002  . CESAREAN SECTION N/A    Phreesia 03/18/2020  . CHOLECYSTECTOMY     with lysis of adhesions for sbo; "ruptured gallbladder".  . COLONOSCOPY  11/09/2011   RMR: Melanosis coli   . COLONOSCOPY WITH PROPOFOL N/A 09/14/2015   Dr. Gala Romney: diverticulosis, 38m TA removed. next TCS 09/2020.   .Marland KitchenCOLONOSCOPY WITH PROPOFOL N/A 04/30/2020   Procedure: COLONOSCOPY WITH PROPOFOL;  Surgeon: RDaneil Dolin MD;  Location: [facility];  Service: Endoscopy;  Laterality: N/A;  PM (ASA 3)  . DENTAL SURGERY  11/2015   multiple tooth extraction  . ESOPHAGOGASTRODUODENOSCOPY  11/29/2007   salmon-colored  tongue   longest stable at  3 cm, distal esophagus as described previously status post biopsy/ Hiatal hernia, otherwise normal stomach D1 and D2  . ESOPHAGOGASTRODUODENOSCOPY  01/06/11   short segment Barrett's esophagus s/p bx/Hiatal hernia  . ESOPHAGOGASTRODUODENOSCOPY (EGD) WITH PROPOFOL N/A 03/20/2014   RJJH:ERDEYCXKdistal esophagus short segment barrett's, bx with no dysplasia. next egd in 03/2017  . ESOPHAGOGASTRODUODENOSCOPY (EGD) WITH PROPOFOL N/A 09/14/2015   Dr. RGala Romney Barrett's without dysplasia, gastritis benign bx, hiatal hernia. next EGD 09/2018.  .Marland KitchenESOPHAGOGASTRODUODENOSCOPY (EGD) WITH PROPOFOL N/A 07/23/2019   Procedure: ESOPHAGOGASTRODUODENOSCOPY (EGD) WITH PROPOFOL;  Surgeon: RDaneil Dolin MD;  Location: [facility];  Service: Endoscopy;  Laterality: N/A;  3:00pm  . EYE SURGERY N/A    Phreesia 03/18/2020  . HERNIA REPAIR Right 07/2010  Dr. Zada Girt  . JOINT REPLACEMENT    . LAPAROSCOPIC CHOLECYSTECTOMY  2017   at [facility]  . POLYPECTOMY  09/14/2015   Procedure: POLYPECTOMY;  Surgeon: Daneil Dolin, MD;  Location: [facility];  Service: Endoscopy;;  ascending colon  . right hip replacement  07/2010   went back in sept 2012 to fix  . SHOULDER ARTHROSCOPY  2008   left  . SPINE SURGERY N/A    Phreesia 03/18/2020  . TOTAL HIP REVISION Right 12/17/2012   Procedure: RIGHT TOTAL HIP REVISION;  Surgeon: Mauri Pole, MD;  Location: [facility];  Service: Orthopedics;  Laterality: Right;  . WRIST SURGERY Right 2011   open reduction right wrist.    Family History  Problem  Relation Age of Onset  . Hypertension Mother   . Stroke Mother   . Colon cancer Neg Hx   . Anesthesia problems Neg Hx   . Hypotension Neg Hx   . Malignant hyperthermia Neg Hx   . Pseudochol deficiency Neg Hx   . Gastric cancer Neg Hx   . Esophageal cancer Neg Hx     Social History   Socioeconomic History  . Marital status: Married    Spouse name: louis  . Number of children: 4  . Years of education: 12+  . Highest education level: Some college, no degree  Occupational History  . Occupation: Press photographer  - retired  . Occupation: non profit  Tobacco Use  . Smoking status: Former Smoker    Packs/day: 0.25    Years: 25.00    Pack years: 6.25    Types: Cigarettes    Quit date: 02/07/2003    Years since quitting: 17.4  . Smokeless tobacco: Never Used  . Tobacco comment: quit in 2004  Vaping Use  . Vaping Use: Never used  Substance and Sexual Activity  . Alcohol use: No  . Drug use: No  . Sexual activity: Yes    Birth control/protection: Surgical  Other Topics Concern  . Not on file  Social History Narrative  . Not on file   Social Determinants of Health   Financial Resource Strain: Low Risk   . Difficulty of Paying Living Expenses: Not hard at all  Food Insecurity: No Food Insecurity  . Worried About Charity fundraiser in the Last Year: Never true  . Ran Out of Food in the Last Year: Never true  Transportation Needs: No Transportation Needs  . Lack of Transportation (Medical): No  . Lack of Transportation (Non-Medical): No  Physical Activity: Sufficiently Active  . Days of Exercise per Week: 7 days  . Minutes of Exercise per Session: 30 min  Stress: No Stress Concern Present  . Feeling of Stress : Not at all  Social Connections: Moderately Integrated  . Frequency of Communication with Friends and Family: More than three times a week  . Frequency of Social Gatherings with Friends and Family: Once a week  . Attends Religious Services: More than 4 times per year   . Active Member of Clubs or Organizations: No  . Attends Archivist Meetings: Never  . Marital Status: Married  Human resources officer Violence: Not At Risk  . Fear of Current or Ex-Partner: No  . Emotionally Abused: No  . Physically Abused: No  . Sexually Abused: No    Outpatient Medications Prior to Visit  Medication Sig Dispense Refill  . acyclovir (ZOVIRAX) 800 MG tablet TAKE 1 TABLET BY MOUTH 4 TIMES DAILY AS NEEDED FOR  BREAKOUTS (Patient taking differently: Take 800 mg by mouth 4 (four) times daily as needed (breakouts). TAKE 1 TABLET BY MOUTH 4 TIMES DAILY AS NEEDED FOR  BREAKOUTS) 30 tablet 0  . ALPRAZolam (XANAX) 0.5 MG tablet Take 1 tablet (0.5 mg total) by mouth at bedtime. 30 tablet 0  . DIGESTIVE ENZYMES PO Take 1 capsule by mouth in the morning.    Marland Kitchen EPINEPHRINE 0.3 mg/0.3 mL IJ SOAJ injection INJECT 0.3 MLS INTO MUSCLE ONCE AS NEEDED (Patient taking differently: Inject 0.3 mg into the muscle as needed for anaphylaxis.) 1 Device 2  . gabapentin (NEURONTIN) 300 MG capsule TAKE 1 CAPSULE BY MOUTH ONCE DAILY FOR  CHRONIC  PAIN (Patient taking differently: Take 300 mg by mouth daily as needed (pain).) 90 capsule 0  . hydrochlorothiazide (HYDRODIURIL) 25 MG tablet Take 1 tablet by mouth once daily (Patient taking differently: Take 25 mg by mouth in the morning.) 90 tablet 0  . losartan (COZAAR) 25 MG tablet Take 1 tablet by mouth once daily for blood pressure 30 tablet 0  . magic mouthwash (nystatin, hydrocortisone, diphenhydrAMINE) suspension Swish and swallow 5 mLs 4 (four) times daily. 240 mL 0  . meloxicam (MOBIC) 7.5 MG tablet Take 1 tablet (7.5 mg total) by mouth daily. (Patient taking differently: Take 7.5 mg by mouth daily as needed for pain (knee pain).) 60 tablet 2  . NATURAL PSYLLIUM FIBER PO Take 2 Scoops by mouth daily. FiberWise --Mixed in 8 ounces of water    . Oxycodone HCl 10 MG TABS Take 1 tablet (10 mg total) by mouth 4 (four) times daily. (Patient taking  differently: Take 10 mg by mouth 4 (four) times daily as needed (pain).) 56 tablet 0  . pantoprazole (PROTONIX) 40 MG tablet Take 1 tablet (40 mg total) by mouth daily before breakfast. 90 tablet 3  . Probiotic Product (EQL DAILY PROBIOTIC PO) Take 1 capsule by mouth daily.    . temazepam (RESTORIL) 30 MG capsule TAKE 1 CAPSULE BY MOUTH AT BEDTIME AS NEEDED FOR SLEEP (Patient taking differently: Take 30 mg by mouth at bedtime.) 30 capsule 0  . tiZANidine (ZANAFLEX) 4 MG tablet Take 8 mg by mouth 3 (three) times daily as needed (spasms). 180 tablet 0  . doxycycline (VIBRA-TABS) 100 MG tablet Take 1 tablet (100 mg total) by mouth 2 (two) times daily. (Patient not taking: Reported on 07/09/2020) 20 tablet 0   No facility-administered medications prior to visit.    Allergies  Allergen Reactions  . Bee Venom Swelling and Hives  . Acetaminophen     itches  . Amlodipine     Hair loss  . Tyloxapol   . Other Itching, Rash and Swelling  . Oxycodone-Acetaminophen Rash    Pt states, "this gives her a rash, but at home she takes oxycodone for pain relief" Pt states, "this gives her a rash, but at home she takes oxycodone for pain relief"    Review of Systems  Constitutional: Positive for fever. Negative for chills and fatigue.  HENT: Positive for congestion, sinus pressure, sinus pain and sore throat.   Respiratory: Negative for cough, shortness of breath and wheezing.        Cough resolved  Cardiovascular: Negative.   Neurological: Positive for light-headedness.       Objective:    Physical Exam  Temp 99.1 F (37.3 C)   Ht _0  (1.651 m)   Wt 119 lb (54 kg)   BMI 19.80 kg/m  Wt Readings from  Last 3 Encounters:  07/09/20 119 lb (54 kg)  05/27/20 119 lb (54 kg)  04/28/20 120 lb (54.4 kg)    Health Maintenance Due  Topic Date Due  . Zoster Vaccines- Shingrix (1 of 2) Never done    There are no preventive care reminders to display for this patient.   Lab Results  Component  Value Date   TSH 1.080 09/16/2019   Lab Results  Component Value Date   WBC 8.7 05/27/2020   HGB 11.9 05/27/2020   HCT 35.3 05/27/2020   MCV 91 05/27/2020   PLT 412 05/27/2020   Lab Results  Component Value Date   NA 141 05/27/2020   K 4.2 05/27/2020   CO2 24 05/27/2020   GLUCOSE 79 05/27/2020   BUN 15 05/27/2020   CREATININE 1.04 (H) 05/27/2020   BILITOT 0.5 11/28/2018   ALKPHOS 98 11/28/2018   AST 16 11/28/2018   ALT 9 11/28/2018   PROT 7.4 11/28/2018   ALBUMIN 4.2 11/28/2018   CALCIUM 9.3 05/27/2020   ANIONGAP 8 04/28/2020   EGFR 56 (L) 05/27/2020   Lab Results  Component Value Date   CHOL 145 11/28/2018   Lab Results  Component Value Date   HDL 58 11/28/2018   Lab Results  Component Value Date   LDLCALC 72 11/28/2018   Lab Results  Component Value Date   TRIG 74 11/28/2018   Lab Results  Component Value Date   CHOLHDL 2.4 12/07/2016   Lab Results  Component Value Date   HGBA1C 6.0 (H) 11/03/2014       Assessment & Plan:   Problem List Items Addressed This Visit      Respiratory   Upper respiratory tract infection - Primary    -ongoing x 1 week -Rx. augmentin -we discussed prednisone, but she was concerned since she had a dose in April, so no prednisone ordered -RTC in 1 week if no improvement      Relevant Medications   amoxicillin-clavulanate (AUGMENTIN) 875-125 MG tablet       Meds ordered this encounter  Medications  . amoxicillin-clavulanate (AUGMENTIN) 875-125 MG tablet    Sig: Take 1 tablet by mouth 2 (two) times daily.    Dispense:  14 tablet    Refill:  0   Date:  07/09/2020   Location of Patient: Home Location of Provider: Office Consent was obtain for visit to be over via telehealth. I verified that I am speaking with the correct person using two identifiers.  I connected with  [name] on 07/09/20 via telephone and verified that I am speaking with the correct person using two identifiers.   I discussed the  limitations of evaluation and management by telemedicine. The patient expressed understanding and agreed to proceed.  Time spent: 9 minutes   Noreene Larsson, NP

## 2020-07-13 ENCOUNTER — Other Ambulatory Visit: Payer: Self-pay | Admitting: Family Medicine

## 2020-07-13 ENCOUNTER — Telehealth: Payer: Self-pay

## 2020-07-13 NOTE — Telephone Encounter (Signed)
Please schedule per the note previously

## 2020-07-13 NOTE — Telephone Encounter (Signed)
Patient called sore throat is not getting any better.Meds are not helping.  Call back # [phone].

## 2020-07-13 NOTE — Telephone Encounter (Signed)
Have her do a covid test, and we will set up an in-office appointment if she is negative.

## 2020-07-13 NOTE — Telephone Encounter (Signed)
Same sore throat, not any better,pt can hardly talk, can something else be called in

## 2020-07-13 NOTE — Telephone Encounter (Signed)
Patient called covid test negative

## 2020-07-13 NOTE — Telephone Encounter (Signed)
Pt advised that covid test was needed to call back and let us know the results and we would go from there with verbal understanding

## 2020-07-14 ENCOUNTER — Ambulatory Visit (INDEPENDENT_AMBULATORY_CARE_PROVIDER_SITE_OTHER): Payer: Medicare Other | Admitting: Nurse Practitioner

## 2020-07-14 ENCOUNTER — Encounter: Payer: Self-pay | Admitting: Nurse Practitioner

## 2020-07-14 ENCOUNTER — Other Ambulatory Visit: Payer: Self-pay

## 2020-07-14 DIAGNOSIS — J029 Acute pharyngitis, unspecified: Secondary | ICD-10-CM | POA: Diagnosis not present

## 2020-07-14 MED ORDER — DOXYCYCLINE HYCLATE 100 MG PO TABS: 100.0000 mg | ORAL_TABLET | Freq: Two times a day (BID) | ORAL | 0 refills | Status: DC

## 2020-07-14 NOTE — Assessment & Plan Note (Addendum)
-  persisting despite augmentin -STOP augmentin -Rx. Doxycycline -will get rapid strep test as well as COVID test today; symptom duration > 5 days, so no antiviral recommended if it were covid -referral to ENT

## 2020-07-14 NOTE — Progress Notes (Signed)
Acute Office Visit  Subjective:    Patient ID: Renee Waters, female    DOB: 06/02/1945, 75 y.o.   MRN: [id number]  Chief Complaint  Patient presents with  . Sore Throat    X 1 week.     HPI Patient is in today for sore throat that has been ongoing for over a week. She had a televisit on 07/09/20, and she was prescribed augmentin. She had recent dose of prednisone, so we didn't prescribe that.  Despite augmentin, she is still having a sore throat and is hoarse.  Past Medical History:  Diagnosis Date  . Acute cholangitis   . Allergy   . Anemia   . Anxiety   . Arthritis    Phreesia 03/18/2020  . Barrett's esophagus   . Cataract   . Chronic back pain   . Chronic neck pain   . Depression   . Genital herpes   . GERD (gastroesophageal reflux disease)   . H/O degenerative disc disease   . History of blood transfusion   . Hypertension   . Insomnia   . Lupus (systemic lupus erythematosus) ([place])   . Neuromuscular disorder ([place])   . Osteoarthritis   . S/P colonoscopy June 2005   normal, no polyps  . S/P endoscopy June 2005, Oct 2009   2005: short-segment Barrett's, 2009: short-segment Barrett's  . UTI (lower urinary tract infection) 11/2012    Past Surgical History:  Procedure Laterality Date  . ABDOMINAL HYSTERECTOMY    . BACK SURGERY    . BIOPSY N/A 03/20/2014   Procedure: BIOPSY;  Surgeon: Daneil Dolin, MD;  Location: [facility];  Service: Endoscopy;  Laterality: N/A;  . BIOPSY  09/14/2015   Procedure: BIOPSY;  Surgeon: Daneil Dolin, MD;  Location: [facility];  Service: Endoscopy;;  esophageal and gastric  . BIOPSY  07/23/2019   Procedure: BIOPSY;  Surgeon: Daneil Dolin, MD;  Location: [facility];  Service: Endoscopy;;  esophageal   . CARPAL TUNNEL RELEASE Left 2013  . cervical disectomy  2002  . CESAREAN SECTION N/A    Phreesia 03/18/2020  . CHOLECYSTECTOMY     with lysis of adhesions for sbo; "ruptured gallbladder".  . COLONOSCOPY  11/09/2011   RMR:  Melanosis coli  . COLONOSCOPY WITH PROPOFOL N/A 09/14/2015   Dr. Gala Romney: diverticulosis, 21m TA removed. next TCS 09/2020.   .Marland KitchenCOLONOSCOPY WITH PROPOFOL N/A 04/30/2020   Procedure: COLONOSCOPY WITH PROPOFOL;  Surgeon: RDaneil Dolin MD;  [facility];  Service: Endoscopy;  Laterality: N/A;  PM (ASA 3)  . DENTAL SURGERY  11/2015   multiple tooth extraction  . ESOPHAGOGASTRODUODENOSCOPY  11/29/2007   salmon-colored  tongue   longest stable at  3 cm, distal esophagus as described previously status post biopsy/ Hiatal hernia, otherwise normal stomach D1 and D2  . ESOPHAGOGASTRODUODENOSCOPY  01/06/11   short segment Barrett's esophagus s/p bx/Hiatal hernia  . ESOPHAGOGASTRODUODENOSCOPY (EGD) WITH PROPOFOL N/A 03/20/2014   RMGN:OIBBCWUGdistal esophagus short segment barrett's, bx with no dysplasia. next egd in 03/2017  . ESOPHAGOGASTRODUODENOSCOPY (EGD) WITH PROPOFOL N/A 09/14/2015   Dr. RGala Romney Barrett's without dysplasia, gastritis benign bx, hiatal hernia. next EGD 09/2018.  .Marland KitchenESOPHAGOGASTRODUODENOSCOPY (EGD) WITH PROPOFOL N/A 07/23/2019   Procedure: ESOPHAGOGASTRODUODENOSCOPY (EGD) WITH PROPOFOL;  Surgeon: RDaneil Dolin MD;  [facility];  Service: Endoscopy;  Laterality: N/A;  3:00pm  . EYE SURGERY N/A    Phreesia 03/18/2020  . HERNIA REPAIR Right 07/2010  Dr. Zada Girt  . JOINT REPLACEMENT    . LAPAROSCOPIC CHOLECYSTECTOMY  2017   at [facility]  . POLYPECTOMY  09/14/2015   Procedure: POLYPECTOMY;  Surgeon: Daneil Dolin, MD;  Location: [facility];  Service: Endoscopy;;  ascending colon  . right hip replacement  07/2010   went back in sept 2012 to fix  . SHOULDER ARTHROSCOPY  2008   left  . SPINE SURGERY N/A    Phreesia 03/18/2020  . TOTAL HIP REVISION Right 12/17/2012   Procedure: RIGHT TOTAL HIP REVISION;  Surgeon: Mauri Pole, MD;  Location: [facility];  Service: Orthopedics;  Laterality: Right;  . WRIST SURGERY Right 2011   open reduction right wrist.    Family  History  Problem Relation Age of Onset  . Hypertension Mother   . Stroke Mother   . Colon cancer Neg Hx   . Anesthesia problems Neg Hx   . Hypotension Neg Hx   . Malignant hyperthermia Neg Hx   . Pseudochol deficiency Neg Hx   . Gastric cancer Neg Hx   . Esophageal cancer Neg Hx     Social History   Socioeconomic History  . Marital status: Married    Spouse name: louis  . Number of children: 4  . Years of education: 12+  . Highest education level: Some college, no degree  Occupational History  . Occupation: Press photographer  - retired  . Occupation: non profit  Tobacco Use  . Smoking status: Former Smoker    Packs/day: 0.25    Years: 25.00    Pack years: 6.25    Types: Cigarettes    Quit date: 02/07/2003    Years since quitting: 17.4  . Smokeless tobacco: Never Used  . Tobacco comment: quit in 2004  Vaping Use  . Vaping Use: Never used  Substance and Sexual Activity  . Alcohol use: No  . Drug use: No  . Sexual activity: Yes    Birth control/protection: Surgical  Other Topics Concern  . Not on file  Social History Narrative  . Not on file   Social Determinants of Health   Financial Resource Strain: Low Risk   . Difficulty of Paying Living Expenses: Not hard at all  Food Insecurity: No Food Insecurity  . Worried About Charity fundraiser in the Last Year: Never true  . Ran Out of Food in the Last Year: Never true  Transportation Needs: No Transportation Needs  . Lack of Transportation (Medical): No  . Lack of Transportation (Non-Medical): No  Physical Activity: Sufficiently Active  . Days of Exercise per Week: 7 days  . Minutes of Exercise per Session: 30 min  Stress: No Stress Concern Present  . Feeling of Stress : Not at all  Social Connections: Moderately Integrated  . Frequency of Communication with Friends and Family: More than three times a week  . Frequency of Social Gatherings with Friends and Family: Once a week  . Attends Religious Services: More than  4 times per year  . Active Member of Clubs or Organizations: No  . Attends Archivist Meetings: Never  . Marital Status: Married  Human resources officer Violence: Not At Risk  . Fear of Current or Ex-Partner: No  . Emotionally Abused: No  . Physically Abused: No  . Sexually Abused: No    Outpatient Medications Prior to Visit  Medication Sig Dispense Refill  . acyclovir (ZOVIRAX) 800 MG tablet TAKE 1 TABLET BY MOUTH 4 TIMES DAILY AS NEEDED FOR  BREAKOUTS (Patient taking differently: Take 800 mg by mouth 4 (four) times daily as needed (breakouts). TAKE 1 TABLET BY MOUTH 4 TIMES DAILY AS NEEDED FOR  BREAKOUTS) 30 tablet 0  . ALPRAZolam (XANAX) 0.5 MG tablet TAKE 1 TABLET BY MOUTH AT BEDTIME 30 tablet 0  . DIGESTIVE ENZYMES PO Take 1 capsule by mouth in the morning.    Marland Kitchen EPINEPHRINE 0.3 mg/0.3 mL IJ SOAJ injection INJECT 0.3 MLS INTO MUSCLE ONCE AS NEEDED (Patient taking differently: Inject 0.3 mg into the muscle as needed for anaphylaxis.) 1 Device 2  . gabapentin (NEURONTIN) 300 MG capsule TAKE 1 CAPSULE BY MOUTH ONCE DAILY FOR  CHRONIC  PAIN (Patient taking differently: Take 300 mg by mouth daily as needed (pain).) 90 capsule 0  . hydrochlorothiazide (HYDRODIURIL) 25 MG tablet Take 1 tablet by mouth once daily (Patient taking differently: Take 25 mg by mouth in the morning.) 90 tablet 0  . losartan (COZAAR) 25 MG tablet Take 1 tablet by mouth once daily for blood pressure 30 tablet 0  . meloxicam (MOBIC) 7.5 MG tablet Take 1 tablet (7.5 mg total) by mouth daily. (Patient taking differently: Take 7.5 mg by mouth daily as needed for pain (knee pain).) 60 tablet 2  . Oxycodone HCl 10 MG TABS Take 1 tablet (10 mg total) by mouth 4 (four) times daily. (Patient taking differently: Take 10 mg by mouth 4 (four) times daily as needed (pain).) 56 tablet 0  . pantoprazole (PROTONIX) 40 MG tablet Take 1 tablet (40 mg total) by mouth daily before breakfast. 90 tablet 3  . Probiotic Product (EQL DAILY  PROBIOTIC PO) Take 1 capsule by mouth daily.    . temazepam (RESTORIL) 30 MG capsule TAKE 1 CAPSULE BY MOUTH AT BEDTIME AS NEEDED FOR SLEEP (Patient taking differently: Take 30 mg by mouth at bedtime.) 30 capsule 0  . tiZANidine (ZANAFLEX) 4 MG tablet Take 8 mg by mouth 3 (three) times daily as needed (spasms). 180 tablet 0  . amoxicillin-clavulanate (AUGMENTIN) 875-125 MG tablet Take 1 tablet by mouth 2 (two) times daily. 14 tablet 0  . magic mouthwash (nystatin, hydrocortisone, diphenhydrAMINE) suspension Swish and swallow 5 mLs 4 (four) times daily. 240 mL 0  . NATURAL PSYLLIUM FIBER PO Take 2 Scoops by mouth daily. FiberWise --Mixed in 8 ounces of water     No facility-administered medications prior to visit.    Allergies  Allergen Reactions  . Bee Venom Swelling and Hives  . Acetaminophen     itches  . Amlodipine     Hair loss  . Tyloxapol   . Other Itching, Rash and Swelling  . Oxycodone-Acetaminophen Rash    Pt states, "this gives her a rash, but at home she takes oxycodone for pain relief" Pt states, "this gives her a rash, but at home she takes oxycodone for pain relief"    Review of Systems  Constitutional: Negative for chills, fatigue and fever.  HENT: Positive for sore throat. Negative for congestion, postnasal drip, rhinorrhea, sinus pressure and sinus pain.   Eyes: Negative for itching.  Respiratory: Positive for shortness of breath. Negative for cough and wheezing.   Cardiovascular: Negative.        Objective:    Physical Exam Constitutional:      Appearance: She is well-developed.  HENT:     Head: Normocephalic and atraumatic.     Right Ear: Tympanic membrane and ear canal normal.     Left Ear: Tympanic membrane and ear canal normal.  Nose: No congestion or rhinorrhea.     Mouth/Throat:     Mouth: Mucous membranes are moist.     Pharynx: Oropharynx is clear. Uvula midline.     Tonsils: No tonsillar exudate or tonsillar abscesses. 1+ on the right. 1+ on  the left.  Neck:     Thyroid: No thyromegaly.  Cardiovascular:     Rate and Rhythm: Normal rate and regular rhythm.     Heart sounds: Normal heart sounds.  Pulmonary:     Effort: Pulmonary effort is normal.     Breath sounds: Normal breath sounds.  Musculoskeletal:     Cervical back: Normal range of motion and neck supple.  Lymphadenopathy:     Cervical: No cervical adenopathy.  Neurological:     Mental Status: She is alert.     BP 119/64   Pulse 66   Temp 98.4 F (36.9 C)   Resp 20   Ht 5' 5"  (1.651 m)   Wt 116 lb (52.6 kg)   SpO2 97%   BMI 19.30 kg/m  Wt Readings from Last 3 Encounters:  07/14/20 116 lb (52.6 kg)  07/09/20 119 lb (54 kg)  05/27/20 119 lb (54 kg)    Health Maintenance Due  Topic Date Due  . Pneumococcal Vaccine 35-56 Years old (1 of 4 - PCV13) Never done  . Zoster Vaccines- Shingrix (1 of 2) Never done    There are no preventive care reminders to display for this patient.   Lab Results  Component Value Date   TSH 1.080 09/16/2019   Lab Results  Component Value Date   WBC 8.7 05/27/2020   HGB 11.9 05/27/2020   HCT 35.3 05/27/2020   MCV 91 05/27/2020   PLT 412 05/27/2020   Lab Results  Component Value Date   NA 141 05/27/2020   K 4.2 05/27/2020   CO2 24 05/27/2020   GLUCOSE 79 05/27/2020   BUN 15 05/27/2020   CREATININE 1.04 (H) 05/27/2020   BILITOT 0.5 11/28/2018   ALKPHOS 98 11/28/2018   AST 16 11/28/2018   ALT 9 11/28/2018   PROT 7.4 11/28/2018   ALBUMIN 4.2 11/28/2018   CALCIUM 9.3 05/27/2020   ANIONGAP 8 04/28/2020   EGFR 56 (L) 05/27/2020   Lab Results  Component Value Date   CHOL 145 11/28/2018   Lab Results  Component Value Date   HDL 58 11/28/2018   Lab Results  Component Value Date   LDLCALC 72 11/28/2018   Lab Results  Component Value Date   TRIG 74 11/28/2018   Lab Results  Component Value Date   CHOLHDL 2.4 12/07/2016   Lab Results  Component Value Date   HGBA1C 6.0 (H) 11/03/2014        Assessment & Plan:   Problem List Items Addressed This Visit      Other   Sore throat    -persisting despite augmentin -STOP augmentin -Rx. Doxycycline -will get rapid strep test as well as COVID test today; symptom duration > 5 days, so no antiviral recommended if it were covid -referral to ENT      Relevant Orders   Novel Coronavirus, NAA ([facility])       Meds ordered this encounter  Medications  . doxycycline (VIBRA-TABS) 100 MG tablet    Sig: Take 1 tablet (100 mg total) by mouth 2 (two) times daily.    Dispense:  20 tablet    Refill:  0     Noreene Larsson, NP

## 2020-07-16 LAB — NOVEL CORONAVIRUS, NAA: SARS-CoV-2, NAA: NOT DETECTED

## 2020-07-16 LAB — SARS-COV-2, NAA 2 DAY TAT

## 2020-07-16 NOTE — Progress Notes (Signed)
COVID test was negative.

## 2020-07-21 ENCOUNTER — Other Ambulatory Visit: Payer: Self-pay | Admitting: Family Medicine

## 2020-07-30 ENCOUNTER — Telehealth (INDEPENDENT_AMBULATORY_CARE_PROVIDER_SITE_OTHER): Payer: Medicare Other | Admitting: Family Medicine

## 2020-07-30 ENCOUNTER — Other Ambulatory Visit: Payer: Self-pay

## 2020-07-30 DIAGNOSIS — B370 Candidal stomatitis: Secondary | ICD-10-CM | POA: Diagnosis not present

## 2020-07-30 DIAGNOSIS — B369 Superficial mycosis, unspecified: Secondary | ICD-10-CM

## 2020-07-30 DIAGNOSIS — B009 Herpesviral infection, unspecified: Secondary | ICD-10-CM | POA: Diagnosis not present

## 2020-07-30 DIAGNOSIS — F331 Major depressive disorder, recurrent, moderate: Secondary | ICD-10-CM

## 2020-07-30 MED ORDER — ACYCLOVIR 400 MG PO TABS: 400.0000 mg | ORAL_TABLET | Freq: Two times a day (BID) | ORAL | 3 refills | Status: DC

## 2020-07-30 MED ORDER — CLOTRIMAZOLE-BETAMETHASONE 1-0.05 % EX CREA
1.0000 | TOPICAL_CREAM | Freq: Two times a day (BID) | CUTANEOUS | 0 refills | Status: DC
Start: 2020-07-30 — End: 2020-10-28

## 2020-07-30 MED ORDER — FLUCONAZOLE 100 MG PO TABS
100.0000 mg | ORAL_TABLET | Freq: Every day | ORAL | 0 refills | Status: DC
Start: 2020-07-30 — End: 2020-09-02

## 2020-07-30 NOTE — Progress Notes (Signed)
Virtual Visit via Telephone Note  I connected with Renee Waters on 07/30/20 at 11:00 AM EDT by telephone and verified that I am speaking with the correct person using two identifiers.  Location: Patient: home Provider: office   I discussed the limitations, risks, security and privacy concerns of performing an evaluation and management service by telephone and the availability of in person appointments. I also discussed with the patient that there may be a patient responsible charge related to this service. The patient expressed understanding and agreed to proceed.   History of Present Illness: Outbreak of genital herpes started last week currently on acyclovir, started 6/14 Reports improvement in thrush on tongue x 2 daysStressed since tick bite. Has flare of hSV2 States she has scaling and sploches over different parts of her body for over 6 months, no purulent drainage, fever or chills, no new personal care products   Observations/Objective: There were no vitals taken for this visit. Good communication with no confusion and intact memory. Alert and oriented x 3 No signs of respiratory distress during speech   Assessment and Plan:  Oral thrush Fluconazole is prescribed  Dermatomycosis Clotrimazole/ betamethasone prescribed  HSV-2 (herpes simplex virus 2) infection Current flare, additional acyclovir is prescribed  MDD (major depressive disorder), recurrent episode, moderate (HCC) icreased anxiety following tick bite, sounds tearful on call, managed by Psych.  Follow Up Instructions:    I discussed the assessment and treatment plan with the patient. The patient was provided an opportunity to ask questions and all were answered. The patient agreed with the plan and demonstrated an understanding of the instructions.   The patient was advised to call back or seek an in-person evaluation if the symptoms worsen or if the condition fails to improve as anticipated.  I provided  15 minutes of non-face-to-face time during this encounter.   Tula Nakayama, MD

## 2020-07-30 NOTE — Patient Instructions (Addendum)
F/U end July, call if you need me sooner  Fasting lipid, tSH and vit D 1 week before next visit  Fluconazole, acyclovir and clotrimazole/ betamethasone cream are prescribed as we discussed  Hope that you feel better soon  Thanks for choosing [facility], we consider it a privelige to serve you.  2nd covid booser is due , please get this as soon as you are able to  Thanks for choosing [facility], we consider it a privelige to serve you.

## 2020-08-02 ENCOUNTER — Encounter: Payer: Self-pay | Admitting: Family Medicine

## 2020-08-02 DIAGNOSIS — B369 Superficial mycosis, unspecified: Secondary | ICD-10-CM | POA: Insufficient documentation

## 2020-08-02 DIAGNOSIS — B009 Herpesviral infection, unspecified: Secondary | ICD-10-CM | POA: Insufficient documentation

## 2020-08-02 DIAGNOSIS — B370 Candidal stomatitis: Secondary | ICD-10-CM | POA: Insufficient documentation

## 2020-08-02 MED ORDER — ALPRAZOLAM 0.5 MG PO TABS: 0.5000 mg | ORAL_TABLET | Freq: Every day | ORAL | 5 refills | Status: DC

## 2020-08-02 MED ORDER — TEMAZEPAM 30 MG PO CAPS: 30.0000 mg | ORAL_CAPSULE | Freq: Every day | ORAL | 5 refills | Status: DC

## 2020-08-02 NOTE — Assessment & Plan Note (Signed)
Clotrimazole/ betamethasone prescribed 

## 2020-08-02 NOTE — Assessment & Plan Note (Signed)
Current flare, additional acyclovir is prescribed

## 2020-08-02 NOTE — Assessment & Plan Note (Signed)
icreased anxiety following tick bite, sounds tearful on call, managed by Psych.

## 2020-08-02 NOTE — Assessment & Plan Note (Signed)
Fluconazole is prescribed

## 2020-08-12 ENCOUNTER — Other Ambulatory Visit: Payer: Self-pay | Admitting: Family Medicine

## 2020-08-12 DIAGNOSIS — M47816 Spondylosis without myelopathy or radiculopathy, lumbar region: Secondary | ICD-10-CM | POA: Diagnosis not present

## 2020-08-12 DIAGNOSIS — Z79891 Long term (current) use of opiate analgesic: Secondary | ICD-10-CM | POA: Diagnosis not present

## 2020-08-12 DIAGNOSIS — M47812 Spondylosis without myelopathy or radiculopathy, cervical region: Secondary | ICD-10-CM | POA: Diagnosis not present

## 2020-08-12 DIAGNOSIS — G894 Chronic pain syndrome: Secondary | ICD-10-CM | POA: Diagnosis not present

## 2020-08-14 ENCOUNTER — Other Ambulatory Visit: Payer: Self-pay | Admitting: Family Medicine

## 2020-08-27 ENCOUNTER — Ambulatory Visit: Payer: Medicare Other | Admitting: Family Medicine

## 2020-09-02 ENCOUNTER — Ambulatory Visit (INDEPENDENT_AMBULATORY_CARE_PROVIDER_SITE_OTHER): Payer: Medicare Other | Admitting: Family Medicine

## 2020-09-02 ENCOUNTER — Encounter: Payer: Self-pay | Admitting: Family Medicine

## 2020-09-02 ENCOUNTER — Other Ambulatory Visit: Payer: Self-pay

## 2020-09-02 VITALS — BP 146/74 | HR 70 | Resp 16 | Ht 65.0 in | Wt 112.1 lb

## 2020-09-02 DIAGNOSIS — R636 Underweight: Secondary | ICD-10-CM | POA: Diagnosis not present

## 2020-09-02 DIAGNOSIS — F418 Other specified anxiety disorders: Secondary | ICD-10-CM

## 2020-09-02 DIAGNOSIS — F331 Major depressive disorder, recurrent, moderate: Secondary | ICD-10-CM

## 2020-09-02 DIAGNOSIS — Z13220 Encounter for screening for lipoid disorders: Secondary | ICD-10-CM | POA: Diagnosis not present

## 2020-09-02 DIAGNOSIS — D649 Anemia, unspecified: Secondary | ICD-10-CM | POA: Diagnosis not present

## 2020-09-02 DIAGNOSIS — F5105 Insomnia due to other mental disorder: Secondary | ICD-10-CM

## 2020-09-02 DIAGNOSIS — J029 Acute pharyngitis, unspecified: Secondary | ICD-10-CM

## 2020-09-02 DIAGNOSIS — R52 Pain, unspecified: Secondary | ICD-10-CM | POA: Diagnosis not present

## 2020-09-02 DIAGNOSIS — E559 Vitamin D deficiency, unspecified: Secondary | ICD-10-CM

## 2020-09-02 DIAGNOSIS — I10 Essential (primary) hypertension: Secondary | ICD-10-CM

## 2020-09-02 DIAGNOSIS — K2100 Gastro-esophageal reflux disease with esophagitis, without bleeding: Secondary | ICD-10-CM

## 2020-09-02 DIAGNOSIS — R634 Abnormal weight loss: Secondary | ICD-10-CM | POA: Diagnosis not present

## 2020-09-02 MED ORDER — MIRTAZAPINE 15 MG PO TABS: 15.0000 mg | ORAL_TABLET | Freq: Every day | ORAL | 3 refills | Status: DC

## 2020-09-02 NOTE — Patient Instructions (Addendum)
F/U to re evaluate weight and blood pressure in 8 weeks, call if you need me sooner  New additional medication at bedtime is Remeron 15 mg , continue all medications that you currently take, this will help your appetite  Ear exam is NORMAL, and I do not feel any lump or abnormality on your left neck/ jaw. If this continues to be a concern , pls send a message or call, I will refer you to ENT   We will help you to get an appointment with Dr Modesta Messing, since you are unable to get through to her   Fasting lipid, cmp and EGFR, vit D and TSH 1 week before next visit please  Thanks for choosing [facility], we consider it a privelige to serve you.

## 2020-09-04 DIAGNOSIS — Z23 Encounter for immunization: Secondary | ICD-10-CM | POA: Diagnosis not present

## 2020-09-07 ENCOUNTER — Encounter: Payer: Self-pay | Admitting: Family Medicine

## 2020-09-07 NOTE — Assessment & Plan Note (Signed)
Sleep hygiene reviewed and written information offered also. Prescription sent for  medication needed.  

## 2020-09-07 NOTE — Assessment & Plan Note (Signed)
Pain management addresses this

## 2020-09-07 NOTE — Assessment & Plan Note (Signed)
Poor sleep and weight loss,  Resume remeron which has worked for her in the past

## 2020-09-07 NOTE — Assessment & Plan Note (Signed)
Normal exam, and no cervical lymphadenitis

## 2020-09-07 NOTE — Progress Notes (Signed)
   Renee Waters     MRN: [id number]      DOB: 1945-11-30   HPI Renee Waters is here for follow up and re-evaluation of chronic medical conditions, medication management and review of any available recent lab and radiology data.  Preventive health is updated, specifically  Cancer screening and Immunization.    The PT denies any adverse reactions to current medications since the last visit.  Tearful and anxious about her state of health, poor appetite and ongoing weight loss. C/o sore throat with c/o swelling on left side of her neck which goes and comes Reports unable to contact her psychiatrist   ROS Denies recent fever or chills. Denies sinus pressure, nasal congestion, ear pain or sore throat. Denies chest congestion, productive cough or wheezing. Denies chest pains, palpitations and leg swelling Denies abdominal pain, nausea, vomiting,diarrhea or constipation.   Denies dysuria, frequency, hesitancy or incontinence. Chronic  joint pain,  and limitation in mobility. Denies headaches, seizures, numbness, or tingling. Chronic  depression, anxiety and  insomnia. Denies skin break down or rash.   PE  BP (!) 146/74   Pulse 70   Resp 16   Ht '5\' 5"'$  (1.651 m)   Wt 112 lb 1.9 oz (50.9 kg)   SpO2 98%   BMI 18.66 kg/m   Patient alert and oriented and in no cardiopulmonary distress.  HEENT: No facial asymmetry, EOMI,     Neck supple .no cervical adenopathy or palpable neck mass  Chest: Clear to auscultation bilaterally.  CVS: S1, S2 no murmurs, no S3.Regular rate.  ABD: Soft non tender.   Ext: No edema  MS: Decreased  ROM spine, adequate in shoulders, hips and knees.  Skin: Intact, no ulcerations or rash noted.  Psych: Good eye contact, normal affect. Memory intact not anxious or depressed appearing.  CNS: CN 2-12 intact, power,  normal throughout.no focal deficits noted.   Assessment & Plan  Essential hypertension Sub optimal, control, not at goal, re eval in 8 weeks,  no med change, limit sodium intake  Insomnia secondary to depression with anxiety Sleep hygiene reviewed and written information offered also. Prescription sent for  medication needed.   MDD (major depressive disorder), recurrent episode, moderate ([place]) Though screening is low, tearful and anxious and states has been trying to reconnect with Psych, review of last note ahd a 6 month f/u for March as she declined the cymbalta she was being treated with at her last visit, will refer to psych once more  Mildly underweight adult Poor sleep and weight loss,  Resume remeron which has worked for her in the past  Sore throat Normal exam, and no cervical lymphadenitis  Generalized pain Pain management addresses this

## 2020-09-07 NOTE — Assessment & Plan Note (Signed)
Though screening is low, tearful and anxious and states has been trying to reconnect with Psych, review of last note ahd a 6 month f/u for March as she declined the cymbalta she was being treated with at her last visit, will refer to psych once more

## 2020-09-07 NOTE — Assessment & Plan Note (Signed)
Sub optimal, control, not at goal, re eval in 8 weeks, no med change, limit sodium intake

## 2020-09-08 ENCOUNTER — Ambulatory Visit: Payer: Medicare Other | Admitting: Family Medicine

## 2020-09-09 ENCOUNTER — Other Ambulatory Visit: Payer: Self-pay | Admitting: Family Medicine

## 2020-09-10 ENCOUNTER — Ambulatory Visit: Payer: Medicare Other | Admitting: Family Medicine

## 2020-09-10 DIAGNOSIS — U071 COVID-19: Secondary | ICD-10-CM | POA: Diagnosis not present

## 2020-09-17 ENCOUNTER — Ambulatory Visit: Payer: Medicare Other | Admitting: Family Medicine

## 2020-10-02 ENCOUNTER — Telehealth: Payer: Self-pay | Admitting: *Deleted

## 2020-10-02 ENCOUNTER — Other Ambulatory Visit: Payer: Self-pay | Admitting: Nurse Practitioner

## 2020-10-02 NOTE — Chronic Care Management (AMB) (Signed)
  Chronic Care Management   Outreach Note  10/02/2020 Name: Renee Waters MRN: [id number] DOB: July 22, 1945  Renee Waters is a 75 y.o. year old female who is a primary care patient of Moshe Cipro Norwood Levo, MD. I reached out to [name] by phone today in response to a referral sent by [name] PCP, Dr. Moshe Cipro.      An unsuccessful telephone outreach was attempted today. The patient was referred to the case management team for assistance with care management and care coordination.   Follow Up Plan: The care management team will reach out to the patient again over the next 7 days.  If patient returns call to provider office, please advise to call [place] at [phone].  [place] [facility]  Direct Dial: [phone]

## 2020-10-05 DIAGNOSIS — E559 Vitamin D deficiency, unspecified: Secondary | ICD-10-CM | POA: Diagnosis not present

## 2020-10-05 DIAGNOSIS — R634 Abnormal weight loss: Secondary | ICD-10-CM | POA: Diagnosis not present

## 2020-10-05 DIAGNOSIS — I10 Essential (primary) hypertension: Secondary | ICD-10-CM | POA: Diagnosis not present

## 2020-10-05 DIAGNOSIS — K2100 Gastro-esophageal reflux disease with esophagitis, without bleeding: Secondary | ICD-10-CM | POA: Diagnosis not present

## 2020-10-05 DIAGNOSIS — Z13220 Encounter for screening for lipoid disorders: Secondary | ICD-10-CM | POA: Diagnosis not present

## 2020-10-06 LAB — CMP14+EGFR
ALT: 15 IU/L (ref 0–32)
AST: 39 IU/L (ref 0–40)
Albumin/Globulin Ratio: 1 — ABNORMAL LOW (ref 1.2–2.2)
Albumin: 3.9 g/dL (ref 3.7–4.7)
Alkaline Phosphatase: 196 IU/L — ABNORMAL HIGH (ref 44–121)
BUN/Creatinine Ratio: 12 (ref 12–28)
BUN: 17 mg/dL (ref 8–27)
Bilirubin Total: 0.4 mg/dL (ref 0.0–1.2)
CO2: 21 mmol/L (ref 20–29)
Calcium: 8.1 mg/dL — ABNORMAL LOW (ref 8.7–10.3)
Chloride: 100 mmol/L (ref 96–106)
Creatinine, Ser: 1.39 mg/dL — ABNORMAL HIGH (ref 0.57–1.00)
Globulin, Total: 4 g/dL (ref 1.5–4.5)
Glucose: 85 mg/dL (ref 65–99)
Potassium: 4.1 mmol/L (ref 3.5–5.2)
Sodium: 141 mmol/L (ref 134–144)
Total Protein: 7.9 g/dL (ref 6.0–8.5)
eGFR: 40 mL/min/{1.73_m2} — ABNORMAL LOW (ref >59)

## 2020-10-06 LAB — TSH: TSH: 3.73 u[IU]/mL (ref 0.450–4.500)

## 2020-10-06 LAB — VITAMIN D 25 HYDROXY (VIT D DEFICIENCY, FRACTURES): Vit D, 25-Hydroxy: 71.5 ng/mL (ref 30.0–100.0)

## 2020-10-06 LAB — LIPID PANEL
Chol/HDL Ratio: 3.5 ratio (ref 0.0–4.4)
Cholesterol, Total: 95 mg/dL — ABNORMAL LOW (ref 100–199)
HDL: 27 mg/dL — ABNORMAL LOW (ref >39)
LDL Chol Calc (NIH): 51 mg/dL (ref 0–99)
Triglycerides: 87 mg/dL (ref 0–149)
VLDL Cholesterol Cal: 17 mg/dL (ref 5–40)

## 2020-10-07 DIAGNOSIS — Z79891 Long term (current) use of opiate analgesic: Secondary | ICD-10-CM | POA: Diagnosis not present

## 2020-10-07 DIAGNOSIS — G894 Chronic pain syndrome: Secondary | ICD-10-CM | POA: Diagnosis not present

## 2020-10-07 DIAGNOSIS — M47812 Spondylosis without myelopathy or radiculopathy, cervical region: Secondary | ICD-10-CM | POA: Diagnosis not present

## 2020-10-07 DIAGNOSIS — M47816 Spondylosis without myelopathy or radiculopathy, lumbar region: Secondary | ICD-10-CM | POA: Diagnosis not present

## 2020-10-08 ENCOUNTER — Other Ambulatory Visit: Payer: Self-pay | Admitting: Family Medicine

## 2020-10-08 ENCOUNTER — Other Ambulatory Visit: Payer: Self-pay

## 2020-10-08 DIAGNOSIS — E860 Dehydration: Secondary | ICD-10-CM

## 2020-10-08 NOTE — Chronic Care Management (AMB) (Signed)
  Chronic Care Management   Note  10/08/2020 Name: Revecca Nachtigal MRN: [id number] DOB: 1945/06/07  Layanna Charo is a 75 y.o. year old female who is a primary care patient of Moshe Cipro Norwood Levo, MD. I reached out to [name] by phone today in response to a referral sent by [name] PCP, Dr. Moshe Cipro.      Ms. Lubinski was given information about Chronic Care Management services today including:  CCM service includes personalized support from designated clinical staff supervised by her physician, including individualized plan of care and coordination with other care providers 24/7 contact phone numbers for assistance for urgent and routine care needs. Service will only be billed when office clinical staff spend 20 minutes or more in a month to coordinate care. Only one practitioner may furnish and bill the service in a calendar month. The patient may stop CCM services at any time (effective at the end of the month) by phone call to the office staff. The patient will be responsible for cost sharing (co-pay) of up to 20% of the service fee (after annual deductible is met).  Patient agreed to services and verbal consent obtained.   Follow up plan: Telephone appointment with care management team member scheduled for:10/13/20  [place] [facility]  Direct Dial: [phone]

## 2020-10-09 ENCOUNTER — Other Ambulatory Visit: Payer: Self-pay | Admitting: Family Medicine

## 2020-10-09 MED ORDER — TEMAZEPAM 30 MG PO CAPS: 30.0000 mg | ORAL_CAPSULE | Freq: Every day | ORAL | 0 refills | Status: DC

## 2020-10-13 ENCOUNTER — Ambulatory Visit (INDEPENDENT_AMBULATORY_CARE_PROVIDER_SITE_OTHER): Payer: Medicare Other | Admitting: *Deleted

## 2020-10-13 NOTE — Chronic Care Management (AMB) (Signed)
Chronic Care Management   CCM RN Visit Note  10/13/2020 Name: Renee Waters MRN: [id number] DOB: 1945-05-24  Subjective: Renee Waters is a 75 y.o. year old female who is a primary care patient of [name], MD. The care management team was consulted for assistance with disease management and care coordination needs.    Engaged with patient by telephone for initial visit in response to provider referral for case management and/or care coordination services.   Consent to Services:  The patient was given the following information about Chronic Care Management services today, agreed to services, and gave verbal consent: 1. CCM service includes personalized support from designated clinical staff supervised by the primary care provider, including individualized plan of care and coordination with other care providers 2. 24/7 contact phone numbers for assistance for urgent and routine care needs. 3. Service will only be billed when office clinical staff spend 20 minutes or more in a month to coordinate care. 4. Only one practitioner may furnish and bill the service in a calendar month. 5.The patient may stop CCM services at any time (effective at the end of the month) by phone call to the office staff. 6. The patient will be responsible for cost sharing (co-pay) of up to 20% of the service fee (after annual deductible is met). Patient agreed to services and consent obtained.  Patient agreed to services and verbal consent obtained.   Assessment: Review of patient past medical history, allergies, medications, health status, including review of consultants reports, laboratory and other test data, was performed as part of comprehensive evaluation and provision of chronic care management services.   SDOH (Social Determinants of Health) assessments and interventions performed:  SDOH Interventions    Flowsheet Row Most Recent Value  SDOH Interventions   Food Insecurity Interventions Intervention  Not Indicated  Housing Interventions Intervention Not Indicated  Transportation Interventions Intervention Not Indicated        CCM Care Plan  Allergies  Allergen Reactions   Bee Venom Swelling and Hives   Acetaminophen     itches   Amlodipine     Hair loss   Tyloxapol    Other Itching, Rash and Swelling   Oxycodone-Acetaminophen Rash    Pt states, "this gives her a rash, but at home she takes oxycodone for pain relief" Pt states, "this gives her a rash, but at home she takes oxycodone for pain relief"    Outpatient Encounter Medications as of 10/13/2020  Medication Sig   acyclovir (ZOVIRAX) 400 MG tablet Take 1 tablet (400 mg total) by mouth 2 (two) times daily.   ALPRAZolam (XANAX) 0.5 MG tablet Take 1 tablet (0.5 mg total) by mouth at bedtime.   EPINEPHRINE 0.3 mg/0.3 mL IJ SOAJ injection INJECT 0.3 MLS INTO MUSCLE ONCE AS NEEDED (Patient taking differently: Inject 0.3 mg into the muscle as needed for anaphylaxis.)   hydrochlorothiazide (HYDRODIURIL) 25 MG tablet Take 1 tablet by mouth once daily   losartan (COZAAR) 25 MG tablet Take 1 tablet by mouth once daily for blood pressure   Oxycodone HCl 10 MG TABS Take 1 tablet (10 mg total) by mouth 4 (four) times daily. (Patient taking differently: Take 10 mg by mouth 4 (four) times daily as needed (pain).)   pantoprazole (PROTONIX) 40 MG tablet TAKE 1 TABLET BY MOUTH ONCE DAILY BEFORE BREAKFAST   Probiotic Product (EQL DAILY PROBIOTIC PO) Take 1 capsule by mouth daily.   temazepam (RESTORIL) 30 MG capsule Take 1 capsule (30 mg total) by  mouth at bedtime.   tiZANidine (ZANAFLEX) 4 MG tablet Take 8 mg by mouth 3 (three) times daily as needed (spasms).   clotrimazole-betamethasone (LOTRISONE) cream Apply 1 application topically 2 (two) times daily.   DIGESTIVE ENZYMES PO Take 1 capsule by mouth in the morning.   gabapentin (NEURONTIN) 300 MG capsule TAKE 1 CAPSULE BY MOUTH ONCE DAILY FOR  CHRONIC  PAIN (Patient not taking: Reported on  10/13/2020)   mirtazapine (REMERON) 15 MG tablet Take 1 tablet (15 mg total) by mouth at bedtime.   No facility-administered encounter medications on file as of 10/13/2020.    Patient Active Problem List   Diagnosis Date Noted   Oral thrush 08/02/2020   Dermatomycosis 08/02/2020   HSV-2 (herpes simplex virus 2) infection 08/02/2020   Sore throat 07/14/2020   Upper respiratory tract infection 07/09/2020   Rib pain on left side 03/21/2020   Generalized pruritus 03/21/2020   Mildly underweight adult 09/19/2019   MDD (major depressive disorder), recurrent, in full remission ([place]) 03/27/2019   Educated about COVID-19 virus infection 06/24/2018   MDD (major depressive disorder), recurrent episode, moderate ([place]) 10/28/2016   Thoracic spine pain 08/31/2016   Anemia, normocytic normochromic 05/27/2015   IDA (iron deficiency anemia) 03/02/2015   Unsteady gait 02/25/2015   Mucosal abnormality of esophagus    Systemic lupus ([place]) 10/31/2012   Vitamin D deficiency 03/16/2012   Generalized pain 03/15/2012   Constipation 10/06/2011   Hip pain 06/29/2011   Barrett's esophagus 12/13/2010   Western blot positive HSV2 06/22/2007   Depression 06/22/2007   Essential hypertension 06/22/2007   GERD (gastroesophageal reflux disease) 06/22/2007   DJD (degenerative joint disease) 06/22/2007   NECK PAIN, CHRONIC 06/22/2007   Chronic midline low back pain with sciatica 06/22/2007   Insomnia secondary to depression with anxiety 06/22/2007    Conditions to be addressed/monitored:HTN, Chronic Pain  Care Plan : Hypertension (Adult)  Updates made by Kassie Mends, RN since 10/13/2020 12:00 AM     Problem: Hypertension (Hypertension)   Priority: Medium     Long-Range Goal: Hypertension Monitored   Start Date: 10/13/2020  Expected End Date: 04/12/2021  This Visit's Progress: On track  Priority: Medium  Note:   Objective:  Last practice recorded BP readings:  BP Readings from Last 3 Encounters:   09/02/20 (!) 146/74  07/14/20 119/64  05/27/20 (!) 172/78   Most recent eGFR/CrCl:  Lab Results  Component Value Date   EGFR 40 (L) 10/05/2020    No components found for: CRCL Current Barriers:  Knowledge Deficits related to basic understanding of hypertension pathophysiology and self care management-  patient reports she lives with her spouse, has other relatives she can call on if needed, does check blood pressure daily with readings "mostly normal"  Reports having all medications and taking as prescribed, uses cane at home, has walker if needed. Case Manager Clinical Goal(s):  patient will verbalize understanding of plan for hypertension management patient will demonstrate improved adherence to prescribed treatment plan for hypertension as evidenced by taking all medications as prescribed, monitoring and recording blood pressure as directed, adhering to low sodium/DASH diet Interventions:  Collaboration with [name], MD regarding development and update of comprehensive plan of care as evidenced by provider attestation and co-signature Inter-disciplinary care team collaboration (see longitudinal plan of care) Evaluation of current treatment plan related to hypertension self management and patient's adherence to plan as established by provider. Provided education to patient re: stroke prevention, s/s of heart attack and  stroke, DASH diet, complications of uncontrolled blood pressure Reviewed medications with patient and discussed importance of compliance Discussed plans with patient for ongoing care management follow up and provided patient with direct contact information for care management team Advised patient, providing education and rationale, to monitor blood pressure daily and record, calling PCP for findings outside established parameters.  Reviewed scheduled/upcoming provider appointments including:  primary care provider 9/21,  Annual  Wellness Visit 11/16 Education on  low sodium diet sent via My Chart Self-Care Activities:  Attends all scheduled provider appointments Calls provider office for new concerns, questions, or BP outside discussed parameters Checks BP and records as discussed Follows a low sodium diet/DASH diet Patient Goals: - check blood pressure daily - write blood pressure results in a log or diary  - follow a low sodium diet - avoid salty snacks and fast food - look over low sodium diet education sent via My Chart - take all medications as prescribed Follow Up Plan: Telephone follow up appointment with care management team member scheduled for:   12/01/2020    Care Plan : Chronic Pain (Adult)  Updates made by Kassie Mends, RN since 10/13/2020 12:00 AM     Problem: Chronic Pain Management (Chronic Pain)   Priority: High     Long-Range Goal: Chronic Pain Managed   Start Date: 10/13/2020  Expected End Date: 04/12/2021  This Visit's Progress: On track  Priority: High  Note:   Current Barriers:  Knowledge Deficits related to self-health management of acute or chronic pain- pt has unmanaged chronic pain (neck, lower back and " all over" DJD). Pt reports she has pain medication which does help.  Patient reports she has "a lot of anxiety and depression can vary" with PHQ2=1 today -although pt reports she is tearful today.  Patient reports has psych doctor left and she has not found a new doctor, per notes- primary care physician was working on referral for psych, pt reports she would like LCSW to work with her on management of anxiety, stress, depression.  Pt has lost weight, her usual weight is 130 pounds and she reports weighing 105 pounds, drinks 6 Ensure daily, her spouse does cook, pt occasionally gets out in yard, she intends to check on going to a gym for water aerobics.  Patient reports she has chronic insomnia and is on medication. Chronic Disease Management support and education needs related to chronic pain Care Coordination needs  related to anxiety, stress, depression in a patient with chronic pain Chronic Disease Management support and education needs related to pain management, management of stress and anxiety. Clinical Goal(s):  patient will verbalize understanding of plan for pain management.  patient will attend all scheduled medical appointments: 9/21 primary care provider   11/16 Annual Wellness Visit  patient will demonstrate use of different relaxation  skills and/or diversional activities to assist with pain reduction (distraction, imagery, relaxation, massage, acupressure, TENS, heat, and cold application.  patient will report pain at a level less than 3 to 4 on a 10-10 rating scale., patient will use pharmacological and nonpharmacological pain relief strategies as prescribed.  patient will verbalize acceptable level of pain relief and ability to engage in desired activities Interventions:  Collaboration with [name], MD regarding development and update of comprehensive plan of care as evidenced by provider attestation and co-signature Pain assessment performed Medications reviewed Discussed plans with patient for ongoing care management follow up and provided patient with direct contact information for care management team Evaluation  of current treatment plan related to chronic pain and patient's adherence to plan as established by provider. Provided education to patient re: pain management (via My Chart) Reviewed medications with patient and discussed importance of taking as prescribed Social Work referral for management of stress, anxiety, depression Discussed plans with patient for ongoing care management follow up and provided patient with direct contact information for care management team Reviewed importance of keeping stress to a minimum, asking for help as needed, getting outside in the sunshine daily Encouraged pt to talk with primary care provider about any concerns such as seeing a  nutritionist  Encouraged healthy diet with 3 meals daily and snacks Patient Goals/Self Care Activities:  - call for medicine refill 2 or 3 days before it runs out - develop a personal pain management plan - keep track of prescription refills - plan exercise or activity when pain is best controlled - prioritize tasks for the day - track times pain is worst and when it is best - track what makes the pain worse and what makes it better - use ice or heat for pain relief  - expect a call from social worker to assist with depression/ anxiety management - talk to your doctor (about nutritionist) at your 9/21 appointment - try to keep stress to a minimum, get outside in the sunshine everyday Will self-administer medications as prescribed Will attend all scheduled provider appointments Will call pharmacy for medication refills 7 days prior to needed refill date Patient will calls provider office for new concerns or questions Follow Up Plan: Telephone follow up appointment with care management team member scheduled for:  12/01/2020       Plan:Telephone follow up appointment with care management team member scheduled for:  12/01/2020  Jacqlyn Larsen [facility], BSN RN Case Manager [facility] [phone]

## 2020-10-13 NOTE — Patient Instructions (Signed)
Visit Information   PATIENT GOALS:   Goals Addressed             This Visit's Progress    Manage Chronic Pain       Timeframe:  Long-Range Goal Priority:  High Start Date:          10/13/2020                   Expected End Date:     04/12/2021                  Follow Up Date 12/01/2020   - call for medicine refill 2 or 3 days before it runs out - develop a personal pain management plan - keep track of prescription refills - plan exercise or activity when pain is best controlled - prioritize tasks for the day - track times pain is worst and when it is best - track what makes the pain worse and what makes it better - use ice or heat for pain relief  - expect a call from social worker to assist with depression/ anxiety management - talk to your doctor (about nutritionist) at your 9/21 appointment - try to keep stress to a minimum, get outside in the sunshine everyday   Why is this important?   Day-to-day life can be hard when you have chronic pain.  Pain medicine is just one piece of the treatment puzzle.  You can try these action steps to help you manage your pain.    Notes:      Track and Manage My Blood Pressure-Hypertension       Timeframe:  Long-Range Goal Priority:  Medium Start Date:     10/13/2020                        Expected End Date:       04/12/2020                Follow Up Date 12/01/2020   - check blood pressure daily - write blood pressure results in a log or diary  - follow a low sodium diet - avoid salty snacks and fast food - look over low sodium diet education sent via My Chart - take all medications as prescribed   Why is this important?   You won't feel high blood pressure, but it can still hurt your blood vessels.  High blood pressure can cause heart or kidney problems. It can also cause a stroke.  Making lifestyle changes like losing a little weight or eating less salt will help.  Checking your blood pressure at home and at different times of the  day can help to control blood pressure.  If the doctor prescribes medicine remember to take it the way the doctor ordered.  Call the office if you cannot afford the medicine or if there are questions about it.     Notes:         Consent to CCM Services: Renee Waters was given information about Chronic Care Management services including:  CCM service includes personalized support from designated clinical staff supervised by her physician, including individualized plan of care and coordination with other care providers 24/7 contact phone numbers for assistance for urgent and routine care needs. Service will only be billed when office clinical staff spend 20 minutes or more in a month to coordinate care. Only one practitioner may furnish and bill the service in a calendar month. The patient  may stop CCM services at any time (effective at the end of the month) by phone call to the office staff. The patient will be responsible for cost sharing (co-pay) of up to 20% of the service fee (after annual deductible is met).  Patient agreed to services and verbal consent obtained.   Patient verbalizes understanding of instructions provided today and agrees to view in [place].   Telephone follow up appointment with care management team member scheduled for:   10/25/2022Chronic Pain, Adult Chronic pain is a type of pain that lasts or keeps coming back for at least 3-6 months. You may have headaches, pain in the abdomen, or pain in other areas of the body. Chronic pain may be related to an illness, such as fibromyalgia or complex regional pain syndrome. Chronic pain may also be related to an injury or a health condition. Sometimes, the cause of chronic pain is not known. Chronic pain can make it hard for you to do daily activities. If not treated, chronic pain can lead to anxiety and depression. Treatment depends on the cause and severity of your pain. You may need to work with a pain specialist to come up  with a treatment plan. The plan may include medicine, counseling, and physical therapy. Many people benefit from a combination of two or more types of treatment to control their pain. Follow these instructions at home: Medicines Take over-the-counter and prescription medicines only as told by your health care provider. Ask your health care provider if the medicine prescribed to you: Requires you to avoid driving or using machinery. Can cause constipation. You may need to take these actions to prevent or treat constipation: Drink enough fluid to keep your urine pale yellow. Take over-the-counter or prescription medicines. Eat foods that are high in fiber, such as beans, whole grains, and fresh fruits and vegetables. Limit foods that are high in fat and processed sugars, such as fried or sweet foods. Treatment plan Follow your treatment plan as told by your health care provider. This may include: Gentle, regular exercise. Eating a healthy diet that includes foods such as vegetables, fruits, fish, and lean meats. Cognitive or behavioral therapy that changes the way you think or act in response to the pain. This may help improve how you feel. Working with a physical therapist. Meditation, yoga, acupuncture, or massage therapy. Aroma, color, light, or sound therapy. Local electrical stimulation. The electrical pulses help to relieve pain by temporarily stopping the nerve impulses that cause you to feel pain. Injections. These deliver numbing or pain-relieving medicines into the spine or the area of pain.  Lifestyle  Ask your health care provider whether you should keep a pain diary. Your health care provider will tell you what information to write in the diary. This may include when you have pain, what the pain feels like, and how medicines and other behaviors or treatments help to reduce the pain. Consider talking with a mental health care provider about how to manage chronic pain. Consider  joining a chronic pain support group. Try to control or lower your stress levels. Talk with your health care provider about ways to do this. General instructions Learn as much as you can about how to manage your chronic pain. Ask your health care provider if an intensive pain rehabilitation program or a chronic pain specialist would be helpful. Check your pain level as told by your health care provider. Ask your health care provider if you should use a pain scale. It is up  to you to get the results of any tests that were done. Ask your health care provider, or the department that is doing the tests, when your results will be ready. Keep all follow-up visits as told by your health care provider. This is important. Contact a health care provider if: Your pain gets worse, or you have new pain. You have trouble sleeping. You have trouble doing your normal activities. Your pain is not controlled with treatment. You have side effects from pain medicine. You feel weak. You notice any other changes that show that your condition is getting worse. Get help right away if: You lose feeling or have numbness in your body. You lose control of bowel or bladder function. Your pain suddenly gets much worse. You develop shaking or chills. You develop confusion. You develop chest pain. You have trouble breathing or shortness of breath. You pass out. You have thoughts about hurting yourself or others. If you ever feel like you may hurt yourself or others, or have thoughts about taking your own life, get help right away. Go to your nearest emergency department or: Call your local emergency services (911 in the [place] Call a suicide crisis helpline, such as the [place] at (678) 402-5174. This is open 24 hours a day in the [place] Text the Crisis Text Line at 912-135-2776 (in the [place].). Summary Chronic pain is a type of pain that lasts or keeps coming back for at least 3-6 months. Chronic  pain may be related to an illness, injury, or other health condition. Sometimes, the cause of chronic pain is not known. Treatment depends on the cause and severity of your pain. Many people benefit from a combination of two or more types of treatment to control their pain. Follow your treatment plan as told by your health care provider. This information is not intended to replace advice given to you by your health care provider. Make sure you discuss any questions you have with your health care provider. Document Revised: 10/11/2018 Document Reviewed: 10/11/2018 Elsevier Patient Education  [place]. Low-Sodium Eating Plan Sodium, which is an element that makes up salt, helps you maintain a healthy balance of fluids in your body. Too much sodium can increase your blood pressure and cause fluid and waste to be held in your body. Your health care provider or dietitian may recommend following this plan if you have high blood pressure (hypertension), kidney disease, liver disease, or heart failure. Eating less sodium can help lower your blood pressure, reduce swelling, and protect your heart, liver, and kidneys. What are tips for following this plan? Reading food labels The Nutrition Facts label lists the amount of sodium in one serving of the food. If you eat more than one serving, you must multiply the listed amount of sodium by the number of servings. Choose foods with less than 140 mg of sodium per serving. Avoid foods with 300 mg of sodium or more per serving. Shopping  Look for lower-sodium products, often labeled as "low-sodium" or "no salt added." Always check the sodium content, even if foods are labeled as "unsalted" or "no salt added." Buy fresh foods. Avoid canned foods and pre-made or frozen meals. Avoid canned, cured, or processed meats. Buy breads that have less than 80 mg of sodium per slice. Cooking  Eat more home-cooked food and less restaurant, buffet, and fast  food. Avoid adding salt when cooking. Use salt-free seasonings or herbs instead of table salt or sea salt. Check with your  health care provider or pharmacist before using salt substitutes. Cook with plant-based oils, such as canola, sunflower, or olive oil. Meal planning When eating at a restaurant, ask that your food be prepared with less salt or no salt, if possible. Avoid dishes labeled as brined, pickled, cured, smoked, or made with soy sauce, miso, or teriyaki sauce. Avoid foods that contain MSG (monosodium glutamate). MSG is sometimes added to [place] food, bouillon, and some canned foods. Make meals that can be grilled, baked, poached, roasted, or steamed. These are generally made with less sodium. General information Most people on this plan should limit their sodium intake to 1,500-2,000 mg (milligrams) of sodium each day. What foods should I eat? Fruits Fresh, frozen, or canned fruit. Fruit juice. Vegetables Fresh or frozen vegetables. "No salt added" canned vegetables. "No salt added" tomato sauce and paste. Low-sodium or reduced-sodium tomato and vegetable juice. Grains Low-sodium cereals, including oats, puffed wheat and rice, and shredded wheat. Low-sodium crackers. Unsalted rice. Unsalted pasta. Low-sodium bread. Whole-grain breads and whole-grain pasta. Meats and other proteins Fresh or frozen (no salt added) meat, poultry, seafood, and fish. Low-sodium canned tuna and salmon. Unsalted nuts. Dried peas, beans, and lentils without added salt. Unsalted canned beans. Eggs. Unsalted nut butters. Dairy Milk. Soy milk. Cheese that is naturally low in sodium, such as ricotta cheese, fresh mozzarella, or Swiss cheese. Low-sodium or reduced-sodium cheese. Cream cheese. Yogurt. Seasonings and condiments Fresh and dried herbs and spices. Salt-free seasonings. Low-sodium mustard and ketchup. Sodium-free salad dressing. Sodium-free light mayonnaise. Fresh or refrigerated horseradish. Lemon  juice. Vinegar. Other foods Homemade, reduced-sodium, or low-sodium soups. Unsalted popcorn and pretzels. Low-salt or salt-free chips. The items listed above may not be a complete list of foods and beverages you can eat. Contact a dietitian for more information. What foods should I avoid? Vegetables Sauerkraut, pickled vegetables, and relishes. Olives. [place] fries. Onion rings. Regular canned vegetables (not low-sodium or reduced-sodium). Regular canned tomato sauce and paste (not low-sodium or reduced-sodium). Regular tomato and vegetable juice (not low-sodium or reduced-sodium). Frozen vegetables in sauces. Grains Instant hot cereals. Bread stuffing, pancake, and biscuit mixes. Croutons. Seasoned rice or pasta mixes. Noodle soup cups. Boxed or frozen macaroni and cheese. Regular salted crackers. Self-rising flour. Meats and other proteins Meat or fish that is salted, canned, smoked, spiced, or pickled. Precooked or cured meat, such as sausages or meat loaves. [place] Salines. Ham. Pepperoni. Hot dogs. Corned beef. Chipped beef. Salt pork. Jerky. Pickled herring. Anchovies and sardines. Regular canned tuna. Salted nuts. Dairy Processed cheese and cheese spreads. Hard cheeses. Cheese curds. Blue cheese. Feta cheese. String cheese. Regular cottage cheese. Buttermilk. Canned milk. Fats and oils Salted butter. Regular margarine. Ghee. Bacon fat. Seasonings and condiments Onion salt, garlic salt, seasoned salt, table salt, and sea salt. Canned and packaged gravies. Worcestershire sauce. Tartar sauce. Barbecue sauce. Teriyaki sauce. Soy sauce, including reduced-sodium. Steak sauce. Fish sauce. Oyster sauce. Cocktail sauce. Horseradish that you find on the shelf. Regular ketchup and mustard. Meat flavorings and tenderizers. Bouillon cubes. Hot sauce. Pre-made or packaged marinades. Pre-made or packaged taco seasonings. Relishes. Regular salad dressings. Salsa. Other foods Salted popcorn and pretzels. Corn chips  and puffs. Potato and tortilla chips. Canned or dried soups. Pizza. Frozen entrees and pot pies. The items listed above may not be a complete list of foods and beverages you should avoid. Contact a dietitian for more information. Summary Eating less sodium can help lower your blood pressure, reduce swelling, and protect your heart, liver, and  kidneys. Most people on this plan should limit their sodium intake to 1,500-2,000 mg (milligrams) of sodium each day. Canned, boxed, and frozen foods are high in sodium. Restaurant foods, fast foods, and pizza are also very high in sodium. You also get sodium by adding salt to food. Try to cook at home, eat more fresh fruits and vegetables, and eat less fast food and canned, processed, or prepared foods. This information is not intended to replace advice given to you by your health care provider. Make sure you discuss any questions you have with your health care provider. Document Revised: 03/01/2019 Document Reviewed: 12/26/2018 Elsevier Patient Education  2022 [facility], BSN RN Case Manager [facility] [phone]   CLINICAL CARE PLAN: Patient Care Plan: Hypertension (Adult)     Problem Identified: Hypertension (Hypertension)   Priority: Medium     Long-Range Goal: Hypertension Monitored   Start Date: 10/13/2020  Expected End Date: 04/12/2021  This Visit's Progress: On track  Priority: Medium  Note:   Objective:  Last practice recorded BP readings:  BP Readings from Last 3 Encounters:  09/02/20 (!) 146/74  07/14/20 119/64  05/27/20 (!) 172/78   Most recent eGFR/CrCl:  Lab Results  Component Value Date   EGFR 40 (L) 10/05/2020    No components found for: CRCL Current Barriers:  Knowledge Deficits related to basic understanding of hypertension pathophysiology and self care management-  patient reports she lives with her spouse, has other relatives she can call on if needed, does check blood pressure  daily with readings "mostly normal"  Reports having all medications and taking as prescribed, uses cane at home, has walker if needed. Case Manager Clinical Goal(s):  patient will verbalize understanding of plan for hypertension management patient will demonstrate improved adherence to prescribed treatment plan for hypertension as evidenced by taking all medications as prescribed, monitoring and recording blood pressure as directed, adhering to low sodium/DASH diet Interventions:  Collaboration with [name], MD regarding development and update of comprehensive plan of care as evidenced by provider attestation and co-signature Inter-disciplinary care team collaboration (see longitudinal plan of care) Evaluation of current treatment plan related to hypertension self management and patient's adherence to plan as established by provider. Provided education to patient re: stroke prevention, s/s of heart attack and stroke, DASH diet, complications of uncontrolled blood pressure Reviewed medications with patient and discussed importance of compliance Discussed plans with patient for ongoing care management follow up and provided patient with direct contact information for care management team Advised patient, providing education and rationale, to monitor blood pressure daily and record, calling PCP for findings outside established parameters.  Reviewed scheduled/upcoming provider appointments including:  primary care provider 9/21,  Annual  Wellness Visit 11/16 Education on low sodium diet sent via My Chart Self-Care Activities:  Attends all scheduled provider appointments Calls provider office for new concerns, questions, or BP outside discussed parameters Checks BP and records as discussed Follows a low sodium diet/DASH diet Patient Goals: - check blood pressure daily - write blood pressure results in a log or diary  - follow a low sodium diet - avoid salty snacks and fast food - look  over low sodium diet education sent via My Chart - take all medications as prescribed Follow Up Plan: Telephone follow up appointment with care management team member scheduled for:   12/01/2020    Patient Care Plan: Chronic Pain (Adult)     Problem Identified: Chronic Pain Management (Chronic  Pain)   Priority: High     Long-Range Goal: Chronic Pain Managed   Start Date: 10/13/2020  Expected End Date: 04/12/2021  This Visit's Progress: On track  Priority: High  Note:   Current Barriers:  Knowledge Deficits related to self-health management of acute or chronic pain- pt has unmanaged chronic pain (neck, lower back and " all over" DJD). Pt reports she has pain medication which does help.  Patient reports she has "a lot of anxiety and depression can vary" with PHQ2=1 today -although pt reports she is tearful today.  Patient reports has psych doctor left and she has not found a new doctor, per notes- primary care physician was working on referral for psych, pt reports she would like LCSW to work with her on management of anxiety, stress, depression.  Pt has lost weight, her usual weight is 130 pounds and she reports weighing 105 pounds, drinks 6 Ensure daily, her spouse does cook, pt occasionally gets out in yard, she intends to check on going to a gym for water aerobics.  Patient reports she has chronic insomnia and is on medication. Chronic Disease Management support and education needs related to chronic pain Care Coordination needs related to anxiety, stress, depression in a patient with chronic pain Chronic Disease Management support and education needs related to pain management, management of stress and anxiety. Clinical Goal(s):  patient will verbalize understanding of plan for pain management.  patient will attend all scheduled medical appointments: 9/21 primary care provider   11/16 Annual Wellness Visit  patient will demonstrate use of different relaxation  skills and/or diversional  activities to assist with pain reduction (distraction, imagery, relaxation, massage, acupressure, TENS, heat, and cold application.  patient will report pain at a level less than 3 to 4 on a 10-10 rating scale., patient will use pharmacological and nonpharmacological pain relief strategies as prescribed.  patient will verbalize acceptable level of pain relief and ability to engage in desired activities Interventions:  Collaboration with [name], MD regarding development and update of comprehensive plan of care as evidenced by provider attestation and co-signature Pain assessment performed Medications reviewed Discussed plans with patient for ongoing care management follow up and provided patient with direct contact information for care management team Evaluation of current treatment plan related to chronic pain and patient's adherence to plan as established by provider. Provided education to patient re: pain management (via My Chart) Reviewed medications with patient and discussed importance of taking as prescribed Social Work referral for management of stress, anxiety, depression Discussed plans with patient for ongoing care management follow up and provided patient with direct contact information for care management team Reviewed importance of keeping stress to a minimum, asking for help as needed, getting outside in the sunshine daily Encouraged pt to talk with primary care provider about any concerns such as seeing a nutritionist  Encouraged healthy diet with 3 meals daily and snacks Patient Goals/Self Care Activities:  - call for medicine refill 2 or 3 days before it runs out - develop a personal pain management plan - keep track of prescription refills - plan exercise or activity when pain is best controlled - prioritize tasks for the day - track times pain is worst and when it is best - track what makes the pain worse and what makes it better - use ice or heat for pain  relief  - expect a call from social worker to assist with depression/ anxiety management - talk to your doctor (about  nutritionist) at your 9/21 appointment - try to keep stress to a minimum, get outside in the sunshine everyday Will self-administer medications as prescribed Will attend all scheduled provider appointments Will call pharmacy for medication refills 7 days prior to needed refill date Patient will calls provider office for new concerns or questions Follow Up Plan: Telephone follow up appointment with care management team member scheduled for:  12/01/2020

## 2020-10-21 DIAGNOSIS — E860 Dehydration: Secondary | ICD-10-CM | POA: Diagnosis not present

## 2020-10-22 LAB — CMP14+EGFR
ALT: 14 IU/L (ref 0–32)
AST: 41 IU/L — ABNORMAL HIGH (ref 0–40)
Albumin/Globulin Ratio: 1 — ABNORMAL LOW (ref 1.2–2.2)
Albumin: 3.9 g/dL (ref 3.7–4.7)
Alkaline Phosphatase: 194 IU/L — ABNORMAL HIGH (ref 44–121)
BUN/Creatinine Ratio: 14 (ref 12–28)
BUN: 22 mg/dL (ref 8–27)
Bilirubin Total: 0.3 mg/dL (ref 0.0–1.2)
CO2: 19 mmol/L — ABNORMAL LOW (ref 20–29)
Calcium: 9.1 mg/dL (ref 8.7–10.3)
Chloride: 102 mmol/L (ref 96–106)
Creatinine, Ser: 1.56 mg/dL — ABNORMAL HIGH (ref 0.57–1.00)
Globulin, Total: 3.9 g/dL (ref 1.5–4.5)
Glucose: 65 mg/dL (ref 65–99)
Potassium: 4.1 mmol/L (ref 3.5–5.2)
Sodium: 140 mmol/L (ref 134–144)
Total Protein: 7.8 g/dL (ref 6.0–8.5)
eGFR: 35 mL/min/{1.73_m2} — ABNORMAL LOW (ref >59)

## 2020-10-28 ENCOUNTER — Ambulatory Visit (INDEPENDENT_AMBULATORY_CARE_PROVIDER_SITE_OTHER): Payer: Medicare Other | Admitting: Family Medicine

## 2020-10-28 ENCOUNTER — Other Ambulatory Visit: Payer: Self-pay

## 2020-10-28 VITALS — BP 134/69 | HR 92 | Temp 98.5°F | Resp 18 | Ht 65.0 in | Wt 116.1 lb

## 2020-10-28 DIAGNOSIS — Z23 Encounter for immunization: Secondary | ICD-10-CM

## 2020-10-28 DIAGNOSIS — N1832 Chronic kidney disease, stage 3b: Secondary | ICD-10-CM | POA: Diagnosis not present

## 2020-10-28 DIAGNOSIS — R636 Underweight: Secondary | ICD-10-CM | POA: Diagnosis not present

## 2020-10-28 DIAGNOSIS — R52 Pain, unspecified: Secondary | ICD-10-CM

## 2020-10-28 DIAGNOSIS — I10 Essential (primary) hypertension: Secondary | ICD-10-CM | POA: Diagnosis not present

## 2020-10-28 DIAGNOSIS — F324 Major depressive disorder, single episode, in partial remission: Secondary | ICD-10-CM | POA: Diagnosis not present

## 2020-10-28 NOTE — Patient Instructions (Addendum)
F/U in 6 to 10 weeks, call if you need me before  Your blood pressure is great and you are gaining weight  Please stop all supplements  Flu vaccine today  You are referred urgently to a kidney specialist because of new deterioration in kidney function for evaluation and management   Thanks for choosing [facility], we consider it a privelige to serve you.

## 2020-10-29 ENCOUNTER — Encounter: Payer: Self-pay | Admitting: Family Medicine

## 2020-10-29 DIAGNOSIS — N183 Chronic kidney disease, stage 3 unspecified: Secondary | ICD-10-CM

## 2020-10-29 HISTORY — DX: Chronic kidney disease, stage 3 unspecified: N18.30

## 2020-10-29 NOTE — Assessment & Plan Note (Signed)
Controlled, no change in medication DASH diet and commitment to daily physical activity for a minimum of 30 minutes discussed and encouraged, as a part of hypertension management. The importance of attaining a healthy weight is also discussed.  BP/Weight 10/28/2020 09/02/2020 07/14/2020 07/09/2020 05/27/2020 04/30/2020 5/68/1275  Systolic BP 170 017 494 - 496 759 82  Diastolic BP 69 74 64 - 78 84 60  Wt. (Lbs) 116.12 112.12 116 119 119 - 120  BMI 19.32 18.66 19.3 19.8 19.8 - 19.97  Some encounter information is confidential and restricted. Go to Review Flowsheets activity to see all data.

## 2020-10-29 NOTE — Assessment & Plan Note (Signed)
Managed by Psych, currently stable, however with new dx of CKD mildly anxious as to be expected. Not suicidal or homicidal

## 2020-10-29 NOTE — Progress Notes (Signed)
   Renee Waters     MRN: [id number]      DOB: [birth year]   HPI Renee Waters is here for follow up and re-evaluation of chronic medical conditions, medication management and review of any available recent lab and radiology data.  Preventive health is updated, specifically  Cancer screening and Immunization.   . The PT denies any adverse reactions to current medications since the last visit.  C/o generalized pain and is followed by pain management, denies use of NSAIDS Has recently started supplement with ensure for weight gain   ROS Denies recent fever or chills. Denies sinus pressure, nasal congestion, ear pain or sore throat. Denies chest congestion, productive cough or wheezing. Denies chest pains, palpitations and leg swelling Denies abdominal pain, nausea, vomiting,diarrhea or constipation.   Denies dysuria, frequency, hesitancy or incontinence. Chronic generalized  joint pain,  and limitation in mobility worsening Denies headaches, seizures, numbness, or tingling. C/o  depression and  anxiety espescially with increased pain and kidney issues, reports drinking a lot of water and that her urine is clear. Denies skin break down or rash.   PE  BP 134/69   Pulse 92   Temp 98.5 F (36.9 C)   Resp 18   Ht 5\' 5"  (1.651 m)   Wt 116 lb 1.9 oz (52.7 kg)   SpO2 95%   BMI 19.32 kg/m   Patient alert and oriented and in no cardiopulmonary distress.  HEENT: No facial asymmetry, EOMI,     Neck supple .  Chest: Clear to auscultation bilaterally.  CVS: S1, S2 no murmurs, no S3.Regular rate.  ABD: Soft non tender.   Ext: No edema  MS: decreased  ROM spine, shoulders, hips and knees.  Skin: Intact, no ulcerations or rash noted.  Psych: Good eye contact, . Memory intact mildly  anxiousand  depressed appearing.  CNS: CN 2-12 intact, power,  normal throughout.no focal deficits noted.   Assessment & Plan  Essential hypertension Controlled, no change in medication DASH diet  and commitment to daily physical activity for a minimum of 30 minutes discussed and encouraged, as a part of hypertension management. The importance of attaining a healthy weight is also discussed.  BP/Weight 10/28/2020 09/02/2020 07/14/2020 07/09/2020 05/27/2020 04/30/2020 0/86/5784  Systolic BP 696 295 284 - 132 440 82  Diastolic BP 69 74 64 - 78 84 60  Wt. (Lbs) 116.12 112.12 116 119 119 - 120  BMI 19.32 18.66 19.3 19.8 19.8 - 19.97  Some encounter information is confidential and restricted. Go to Review Flowsheets activity to see all data.       Mildly underweight adult Improved. Pt applauded on succesful weight gain and encouraged to continue same  Generalized pain Reports increased and uncontrolled pain  CKD (chronic kidney disease) stage 3, GFR 30-59 ml/min (HCC) Marked deterioration in lkidney function in past 5 months, unclear trigger, needs Nephrology assessment and mangement asap, urgent referral entered and completed  Depression Managed by Psych, currently stable, however with new dx of CKD mildly anxious as to be expected. Not suicidal or homicidal

## 2020-10-29 NOTE — Assessment & Plan Note (Addendum)
Improved. Pt applauded on succesful weight gain and encouraged to continue same

## 2020-10-29 NOTE — Assessment & Plan Note (Signed)
Reports increased and uncontrolled pain

## 2020-10-29 NOTE — Assessment & Plan Note (Signed)
Marked deterioration in lkidney function in past 5 months, unclear trigger, needs Nephrology assessment and mangement asap, urgent referral entered and completed

## 2020-10-30 DIAGNOSIS — E872 Acidosis: Secondary | ICD-10-CM | POA: Diagnosis not present

## 2020-10-30 DIAGNOSIS — N1831 Chronic kidney disease, stage 3a: Secondary | ICD-10-CM | POA: Diagnosis not present

## 2020-10-30 DIAGNOSIS — M329 Systemic lupus erythematosus, unspecified: Secondary | ICD-10-CM | POA: Diagnosis not present

## 2020-10-30 DIAGNOSIS — I9589 Other hypotension: Secondary | ICD-10-CM | POA: Diagnosis not present

## 2020-10-30 DIAGNOSIS — N170 Acute kidney failure with tubular necrosis: Secondary | ICD-10-CM | POA: Diagnosis not present

## 2020-11-05 DIAGNOSIS — I9589 Other hypotension: Secondary | ICD-10-CM | POA: Diagnosis not present

## 2020-11-05 DIAGNOSIS — M329 Systemic lupus erythematosus, unspecified: Secondary | ICD-10-CM | POA: Diagnosis not present

## 2020-11-05 DIAGNOSIS — N1831 Chronic kidney disease, stage 3a: Secondary | ICD-10-CM | POA: Diagnosis not present

## 2020-11-05 DIAGNOSIS — N170 Acute kidney failure with tubular necrosis: Secondary | ICD-10-CM | POA: Diagnosis not present

## 2020-11-05 DIAGNOSIS — Z79899 Other long term (current) drug therapy: Secondary | ICD-10-CM | POA: Diagnosis not present

## 2020-11-05 DIAGNOSIS — Z131 Encounter for screening for diabetes mellitus: Secondary | ICD-10-CM | POA: Diagnosis not present

## 2020-11-05 DIAGNOSIS — E872 Acidosis: Secondary | ICD-10-CM | POA: Diagnosis not present

## 2020-11-05 DIAGNOSIS — D519 Vitamin B12 deficiency anemia, unspecified: Secondary | ICD-10-CM | POA: Diagnosis not present

## 2020-11-06 ENCOUNTER — Emergency Department (HOSPITAL_COMMUNITY)
Admission: EM | Admit: 2020-11-06 | Discharge: 2020-11-06 | Disposition: A | Discharge status: Home / Self Care | Payer: Medicare Other | Attending: Emergency Medicine | Admitting: Emergency Medicine

## 2020-11-06 ENCOUNTER — Other Ambulatory Visit: Payer: Self-pay

## 2020-11-06 ENCOUNTER — Encounter (HOSPITAL_COMMUNITY): Payer: Self-pay

## 2020-11-06 DIAGNOSIS — Z79899 Other long term (current) drug therapy: Secondary | ICD-10-CM | POA: Diagnosis not present

## 2020-11-06 DIAGNOSIS — D72829 Elevated white blood cell count, unspecified: Secondary | ICD-10-CM | POA: Diagnosis not present

## 2020-11-06 DIAGNOSIS — I10 Essential (primary) hypertension: Secondary | ICD-10-CM | POA: Diagnosis not present

## 2020-11-06 DIAGNOSIS — R791 Abnormal coagulation profile: Secondary | ICD-10-CM | POA: Insufficient documentation

## 2020-11-06 DIAGNOSIS — C9590 Leukemia, unspecified not having achieved remission: Secondary | ICD-10-CM | POA: Diagnosis not present

## 2020-11-06 DIAGNOSIS — Z96641 Presence of right artificial hip joint: Secondary | ICD-10-CM | POA: Insufficient documentation

## 2020-11-06 DIAGNOSIS — I129 Hypertensive chronic kidney disease with stage 1 through stage 4 chronic kidney disease, or unspecified chronic kidney disease: Secondary | ICD-10-CM | POA: Insufficient documentation

## 2020-11-06 DIAGNOSIS — C9500 Acute leukemia of unspecified cell type not having achieved remission: Secondary | ICD-10-CM

## 2020-11-06 DIAGNOSIS — D649 Anemia, unspecified: Secondary | ICD-10-CM | POA: Insufficient documentation

## 2020-11-06 DIAGNOSIS — N183 Chronic kidney disease, stage 3 unspecified: Secondary | ICD-10-CM | POA: Diagnosis not present

## 2020-11-06 DIAGNOSIS — Z20822 Contact with and (suspected) exposure to covid-19: Secondary | ICD-10-CM | POA: Diagnosis not present

## 2020-11-06 DIAGNOSIS — Z87891 Personal history of nicotine dependence: Secondary | ICD-10-CM | POA: Diagnosis not present

## 2020-11-06 DIAGNOSIS — M8949 Other hypertrophic osteoarthropathy, multiple sites: Secondary | ICD-10-CM | POA: Diagnosis not present

## 2020-11-06 LAB — COMPREHENSIVE METABOLIC PANEL
ALT: 15 U/L (ref 0–44)
AST: 33 U/L (ref 15–41)
Albumin: 3.9 g/dL (ref 3.5–5.0)
Alkaline Phosphatase: 186 U/L — ABNORMAL HIGH (ref 38–126)
Anion gap: 8 (ref 5–15)
BUN: 17 mg/dL (ref 8–23)
CO2: 25 mmol/L (ref 22–32)
Calcium: 8.7 mg/dL — ABNORMAL LOW (ref 8.9–10.3)
Chloride: 107 mmol/L (ref 98–111)
Creatinine, Ser: 1.37 mg/dL — ABNORMAL HIGH (ref 0.44–1.00)
GFR, Estimated: 41 mL/min — ABNORMAL LOW (ref >60)
Glucose, Bld: 79 mg/dL (ref 70–99)
Potassium: 4.2 mmol/L (ref 3.5–5.1)
Sodium: 140 mmol/L (ref 135–145)
Total Bilirubin: 0.7 mg/dL (ref 0.3–1.2)
Total Protein: 8.6 g/dL — ABNORMAL HIGH (ref 6.5–8.1)

## 2020-11-06 LAB — URINALYSIS, ROUTINE W REFLEX MICROSCOPIC
Bacteria, UA: NONE SEEN
Bilirubin Urine: NEGATIVE
Glucose, UA: NEGATIVE mg/dL
Hgb urine dipstick: NEGATIVE
Ketones, ur: NEGATIVE mg/dL
Nitrite: NEGATIVE
Protein, ur: 30 mg/dL — AB
Specific Gravity, Urine: 1.012 (ref 1.005–1.030)
pH: 6 (ref 5.0–8.0)

## 2020-11-06 LAB — LACTATE DEHYDROGENASE: LDH: 589 U/L — ABNORMAL HIGH (ref 98–192)

## 2020-11-06 LAB — URIC ACID: Uric Acid, Serum: 8.2 mg/dL — ABNORMAL HIGH (ref 2.5–7.1)

## 2020-11-06 LAB — TYPE AND SCREEN
ABO/RH(D): O POS
Antibody Screen: POSITIVE

## 2020-11-06 LAB — ABO/RH: ABO/RH(D): O POS

## 2020-11-06 LAB — RESP PANEL BY RT-PCR (FLU A&B, COVID) ARPGX2
Influenza A by PCR: NEGATIVE
Influenza B by PCR: NEGATIVE
SARS Coronavirus 2 by RT PCR: NEGATIVE

## 2020-11-06 LAB — PROTIME-INR
INR: 1.4 — ABNORMAL HIGH (ref 0.8–1.2)
Prothrombin Time: 16.9 seconds — ABNORMAL HIGH (ref 11.4–15.2)

## 2020-11-06 LAB — APTT: aPTT: 36 seconds (ref 24–36)

## 2020-11-06 NOTE — Discharge Instructions (Addendum)
As discussed you will need to follow-up with [name] this coming Monday for further evaluation and further instruction on the next steps regarding your diagnosis of leukemia.  [name] is aware of your case and has your medical record number and will also work with his staff to get you in on Monday.  Please review the following reasons to seek immediate care at the emergency department.  Get help right away if you: Have trouble breathing. Have uncontrolled bleeding, or have blood in your urine or stool (feces). Have a temperature of 100.28F (38C) or higher. Have chills or sweats. Are confused. Have very blurry vision.

## 2020-11-06 NOTE — ED Provider Notes (Signed)
[facility] Provider Note   CSN: [id number] Arrival date & time: 11/06/20  1711     History No chief complaint on file.   Renee Waters is a 75 y.o. female with a past medical history of and CKD SLE sent in by her nephrologist for evaluation of leukocytosis.  Patient reports that she has had 6 months of unexplained weight loss of about 20 to 25 pounds.  She has had associated loss of appetite, early satiety, and over the past 3 weeks has been having soaking night sweats.  She went into see her nephrologist last week to do standard lab work.  This past Thursday she had blood drawn and it showed that she had a leukocytosis of greater than 60,000.  She was told to come immediately to the emergency department for further evaluation.  She has no other complaints at this time.  She has not been on any steroids recently.  HPI     Past Medical History:  Diagnosis Date   Acute cholangitis    Allergy    Anemia    Anxiety    Arthritis    Phreesia 03/18/2020   Barrett's esophagus    Cataract    Chronic back pain    Chronic neck pain    CKD (chronic kidney disease) stage 3, GFR 30-59 ml/min ([place]) 10/29/2020   Depression    Genital herpes    GERD (gastroesophageal reflux disease)    H/O degenerative disc disease    History of blood transfusion    Hypertension    Insomnia    Lupus (systemic lupus erythematosus) ([place])    Neuromuscular disorder ([place])    Osteoarthritis    S/P colonoscopy June 2005   normal, no polyps   S/P endoscopy June 2005, Oct 2009   2005: short-segment Barrett's, 2009: short-segment Barrett's   UTI (lower urinary tract infection) 11/2012    Patient Active Problem List   Diagnosis Date Noted   CKD (chronic kidney disease) stage 3, GFR 30-59 ml/min ([place]) 10/29/2020   Dermatomycosis 08/02/2020   HSV-2 (herpes simplex virus 2) infection 08/02/2020   Upper respiratory tract infection 07/09/2020   Rib pain on left side 03/21/2020   Generalized  pruritus 03/21/2020   Mildly underweight adult 09/19/2019   MDD (major depressive disorder), recurrent, in full remission ([place]) 03/27/2019   Educated about COVID-19 virus infection 06/24/2018   MDD (major depressive disorder), recurrent episode, moderate ([employer]) 10/28/2016   Thoracic spine pain 08/31/2016   Anemia, normocytic normochromic 05/27/2015   IDA (iron deficiency anemia) 03/02/2015   Unsteady gait 02/25/2015   Mucosal abnormality of esophagus    Systemic lupus ([place]) 10/31/2012   Vitamin D deficiency 03/16/2012   Generalized pain 03/15/2012   Constipation 10/06/2011   Hip pain 06/29/2011   Barrett's esophagus 12/13/2010   Western blot positive HSV2 06/22/2007   Depression 06/22/2007   Essential hypertension 06/22/2007   GERD (gastroesophageal reflux disease) 06/22/2007   DJD (degenerative joint disease) 06/22/2007   NECK PAIN, CHRONIC 06/22/2007   Chronic midline low back pain with sciatica 06/22/2007   Insomnia secondary to depression with anxiety 06/22/2007    Past Surgical History:  Procedure Laterality Date   ABDOMINAL HYSTERECTOMY     BACK SURGERY     BIOPSY N/A 03/20/2014   Procedure: BIOPSY;  Surgeon: Daneil Dolin, MD;  Location: [facility];  Service: Endoscopy;  Laterality: N/A;   BIOPSY  09/14/2015   Procedure: BIOPSY;  Surgeon: Daneil Dolin, MD;  Location:  [facility];  Service: Endoscopy;;  esophageal and gastric   BIOPSY  07/23/2019   Procedure: BIOPSY;  Surgeon: Daneil Dolin, MD;  Location: [facility];  Service: Endoscopy;;  esophageal    CARPAL TUNNEL RELEASE Left 2013   cervical disectomy  2002   CESAREAN SECTION N/A    Phreesia 03/18/2020   CHOLECYSTECTOMY     with lysis of adhesions for sbo; "ruptured gallbladder".   COLONOSCOPY  11/09/2011   RMR: Melanosis coli   COLONOSCOPY WITH PROPOFOL N/A 09/14/2015   Dr. Gala Romney: diverticulosis, 76mm TA removed. next TCS 09/2020.    COLONOSCOPY WITH PROPOFOL N/A 04/30/2020   Procedure: COLONOSCOPY WITH PROPOFOL;   Surgeon: Daneil Dolin, MD;  Location: [facility];  Service: Endoscopy;  Laterality: N/A;  PM (ASA 3)   DENTAL SURGERY  11/2015   multiple tooth extraction   ESOPHAGOGASTRODUODENOSCOPY  11/29/2007   salmon-colored  tongue   longest stable at  3 cm, distal esophagus as described previously status post biopsy/ Hiatal hernia, otherwise normal stomach D1 and D2   ESOPHAGOGASTRODUODENOSCOPY  01/06/11   short segment Barrett's esophagus s/p bx/Hiatal hernia   ESOPHAGOGASTRODUODENOSCOPY (EGD) WITH PROPOFOL N/A 03/20/2014   FYB:OFBPZWCH distal esophagus short segment barrett's, bx with no dysplasia. next egd in 03/2017   ESOPHAGOGASTRODUODENOSCOPY (EGD) WITH PROPOFOL N/A 09/14/2015   Dr. Gala Romney: Barrett's without dysplasia, gastritis benign bx, hiatal hernia. next EGD 09/2018.   ESOPHAGOGASTRODUODENOSCOPY (EGD) WITH PROPOFOL N/A 07/23/2019   Procedure: ESOPHAGOGASTRODUODENOSCOPY (EGD) WITH PROPOFOL;  Surgeon: Daneil Dolin, MD;  [facility];  Service: Endoscopy;  Laterality: N/A;  3:00pm   EYE SURGERY N/A    Phreesia 03/18/2020   HERNIA REPAIR Right 07/2010   Dr. Zada Girt   JOINT REPLACEMENT     LAPAROSCOPIC CHOLECYSTECTOMY  2017   at [facility]   POLYPECTOMY  09/14/2015   Procedure: POLYPECTOMY;  Surgeon: Daneil Dolin, MD;  Location: [facility];  Service: Endoscopy;;  ascending colon   right hip replacement  07/2010   went back in sept 2012 to fix   SHOULDER ARTHROSCOPY  2008   left   SPINE SURGERY N/A    Phreesia 03/18/2020   TOTAL HIP REVISION Right 12/17/2012   Procedure: RIGHT TOTAL HIP REVISION;  Surgeon: Mauri Pole, MD;  Location: [facility];  Service: Orthopedics;  Laterality: Right;   WRIST SURGERY Right 2011   open reduction right wrist.     OB History   No obstetric history on file.     Family History  Problem Relation Age of Onset   Hypertension Mother    Stroke Mother    Colon cancer Neg Hx    Anesthesia problems Neg Hx    Hypotension Neg Hx    Malignant  hyperthermia Neg Hx    Pseudochol deficiency Neg Hx    Gastric cancer Neg Hx    Esophageal cancer Neg Hx     Social History   Tobacco Use   Smoking status: Former    Packs/day: 0.25    Years: 25.00    Pack years: 6.25    Types: Cigarettes    Quit date: 02/07/2003    Years since quitting: 17.7   Smokeless tobacco: Never   Tobacco comments:    quit in 2004  Vaping Use   Vaping Use: Never used  Substance Use Topics   Alcohol use: No   Drug use: No    Home Medications Prior to Admission medications   Medication Sig  Start Date End Date Taking? Authorizing Provider  acyclovir (ZOVIRAX) 400 MG tablet Take 1 tablet (400 mg total) by mouth 2 (two) times daily. 07/30/20   [name], MD  ALPRAZolam Duanne Moron) 0.5 MG tablet Take 1 tablet (0.5 mg total) by mouth at bedtime. 08/15/20   [name], MD  EPINEPHRINE 0.3 mg/0.3 mL IJ SOAJ injection INJECT 0.3 MLS INTO MUSCLE ONCE AS NEEDED Patient taking differently: Inject 0.3 mg into the muscle as needed for anaphylaxis. 10/04/16   [name], MD  gabapentin (NEURONTIN) 300 MG capsule TAKE 1 CAPSULE BY MOUTH ONCE DAILY FOR  CHRONIC  PAIN Patient taking differently: TAKE 1 CAPSULE BY MOUTH ONCE DAILY FOR  CHRONIC  PAIN 03/11/20   [name], MD  hydrochlorothiazide (HYDRODIURIL) 25 MG tablet Take 1 tablet by mouth once daily 08/14/20   [name], MD  losartan (COZAAR) 25 MG tablet Take 1 tablet by mouth once daily for blood pressure 10/09/20   [name], MD  Oxycodone HCl 10 MG TABS Take 1 tablet (10 mg total) by mouth 4 (four) times daily. Patient taking differently: Take 10 mg by mouth 4 (four) times daily as needed (pain). 02/23/16   [name], MD  pantoprazole (PROTONIX) 40 MG tablet TAKE 1 TABLET BY MOUTH ONCE DAILY BEFORE BREAKFAST 10/02/20   Mahala Menghini, PA-C  Probiotic Product (EQL DAILY PROBIOTIC PO) Take 1 capsule by mouth daily.    [provider]  temazepam  (RESTORIL) 30 MG capsule Take 1 capsule (30 mg total) by mouth at bedtime. 10/09/20   [name], MD  tiZANidine (ZANAFLEX) 4 MG tablet Take 8 mg by mouth 3 (three) times daily as needed (spasms). 08/05/19   [name], MD    Allergies    Bee venom, Acetaminophen, Amlodipine, Tyloxapol, Other, and Oxycodone-acetaminophen  Review of Systems   Review of Systems Ten systems reviewed and are negative for acute change, except as noted in the HPI.   Physical Exam Updated Vital Signs BP (!) 142/76   Pulse (!) 55   Temp 98 F (36.7 C) (Oral)   Resp 17   Ht 5\' 5"  (1.651 m)   Wt 49.9 kg   SpO2 100%   BMI 18.30 kg/m   Physical Exam Vitals and nursing note reviewed.  Constitutional:      General: She is not in acute distress.    Appearance: She is well-developed. She is not diaphoretic.  HENT:     Head: Normocephalic and atraumatic.     Right Ear: External ear normal.     Left Ear: External ear normal.     Nose: Nose normal.     Mouth/Throat:     Mouth: Mucous membranes are moist.  Eyes:     General: No scleral icterus.    Conjunctiva/sclera: Conjunctivae normal.  Cardiovascular:     Rate and Rhythm: Normal rate and regular rhythm.     Heart sounds: Normal heart sounds. No murmur heard.   No friction rub. No gallop.  Pulmonary:     Effort: Pulmonary effort is normal. No respiratory distress.     Breath sounds: Normal breath sounds.  Abdominal:     General: Bowel sounds are normal. There is no distension.     Palpations: Abdomen is soft. There is no mass.     Tenderness: There is no abdominal tenderness. There is no guarding.  Musculoskeletal:     Cervical back: Normal range of motion.  Skin:    General: Skin is warm and dry.  Neurological:     Mental Status: She is alert and oriented to person, place, and time.  Psychiatric:        Behavior: Behavior normal.    ED Results / Procedures / Treatments   Labs (all labs ordered are listed, but only abnormal  results are displayed) Labs Reviewed  RESP PANEL BY RT-PCR (FLU A&B, COVID) ARPGX2  CBC WITH DIFFERENTIAL/PLATELET  COMPREHENSIVE METABOLIC PANEL  URINALYSIS, ROUTINE W REFLEX MICROSCOPIC    EKG None  Radiology No results found.  Procedures Procedures   Medications Ordered in ED Medications - No data to display  ED Course  I have reviewed the triage vital signs and the nursing notes.  Pertinent labs & imaging results that were available during my care of the patient were reviewed by me and considered in my medical decision making (see chart for details).    MDM Rules/Calculators/A&P                          Patient here with several months of B symptoms and new onset leukocytosis.  She appears to have leukocytosis of greater than 75,000 with likely blast formation that we will need pathology review.  Patient also has mild anemia.  Her platelets are within normal limits.  The remainder of the patient's labs shows chronic renal insufficiency.  LDH and uric acid are like slightly elevated as well as PT/INR.  APTT within normal limits. Discussed the case with [name].  The patient does not have any evidence of hyperviscosity syndrome or tumor lysis syndrome.  She is otherwise well-appearing.  Had a long discussion with the patient about her diagnosis findings and the next steps.  Her daughter is at bedside and I answered all questions to the best of my ability.  Patient understands that she has a diagnosis of leukemia and will need to follow closely with the oncologist this coming Monday at which point her pathology smear should have been read and they can answer further questions as well as direct care.  Patient seems to understand this finding and has no further questions at this time.  She appears otherwise appropriate for discharge. Final Clinical Impression(s) / ED Diagnoses Final diagnoses:  None    Rx / DC Orders ED Discharge Orders     None        Margarita Mail, PA-C 11/06/20 2239    Varney Biles, MD 11/08/20 1345

## 2020-11-06 NOTE — ED Triage Notes (Signed)
Pt. States they had their blood drawn and was told by their PCP to come to the ED. Per pt. WBC was 10,000 last week. Yesterday it was 60,000. Hem was normal last week and 8 yesterday.

## 2020-11-09 ENCOUNTER — Other Ambulatory Visit: Payer: Self-pay

## 2020-11-09 ENCOUNTER — Inpatient Hospital Stay (HOSPITAL_COMMUNITY): Payer: Medicare Other | Attending: Hematology | Admitting: Hematology

## 2020-11-09 ENCOUNTER — Inpatient Hospital Stay (HOSPITAL_COMMUNITY): Payer: Medicare Other

## 2020-11-09 VITALS — BP 178/85 | HR 80 | Temp 98.6°F | Resp 16 | Ht 65.0 in | Wt 116.6 lb

## 2020-11-09 DIAGNOSIS — M329 Systemic lupus erythematosus, unspecified: Secondary | ICD-10-CM | POA: Insufficient documentation

## 2020-11-09 DIAGNOSIS — K219 Gastro-esophageal reflux disease without esophagitis: Secondary | ICD-10-CM | POA: Diagnosis not present

## 2020-11-09 DIAGNOSIS — E790 Hyperuricemia without signs of inflammatory arthritis and tophaceous disease: Secondary | ICD-10-CM | POA: Diagnosis not present

## 2020-11-09 DIAGNOSIS — D72829 Elevated white blood cell count, unspecified: Secondary | ICD-10-CM

## 2020-11-09 DIAGNOSIS — N183 Chronic kidney disease, stage 3 unspecified: Secondary | ICD-10-CM | POA: Diagnosis not present

## 2020-11-09 DIAGNOSIS — M549 Dorsalgia, unspecified: Secondary | ICD-10-CM | POA: Insufficient documentation

## 2020-11-09 DIAGNOSIS — F32A Depression, unspecified: Secondary | ICD-10-CM | POA: Diagnosis not present

## 2020-11-09 DIAGNOSIS — G8929 Other chronic pain: Secondary | ICD-10-CM | POA: Diagnosis not present

## 2020-11-09 DIAGNOSIS — G709 Myoneural disorder, unspecified: Secondary | ICD-10-CM | POA: Diagnosis not present

## 2020-11-09 DIAGNOSIS — Z87891 Personal history of nicotine dependence: Secondary | ICD-10-CM | POA: Insufficient documentation

## 2020-11-09 DIAGNOSIS — I10 Essential (primary) hypertension: Secondary | ICD-10-CM | POA: Insufficient documentation

## 2020-11-09 DIAGNOSIS — B009 Herpesviral infection, unspecified: Secondary | ICD-10-CM | POA: Insufficient documentation

## 2020-11-09 DIAGNOSIS — F419 Anxiety disorder, unspecified: Secondary | ICD-10-CM | POA: Insufficient documentation

## 2020-11-09 DIAGNOSIS — R5383 Other fatigue: Secondary | ICD-10-CM | POA: Diagnosis not present

## 2020-11-09 DIAGNOSIS — R10811 Right upper quadrant abdominal tenderness: Secondary | ICD-10-CM

## 2020-11-09 LAB — PATHOLOGIST SMEAR REVIEW

## 2020-11-09 LAB — CBC WITH DIFFERENTIAL/PLATELET
Band Neutrophils: 6 %
Basophils Absolute: 0 10*3/uL (ref 0.0–0.1)
Basophils Relative: 0 %
Blasts: 1 %
Eosinophils Absolute: 0 10*3/uL (ref 0.0–0.5)
Eosinophils Relative: 0 %
HCT: 31.4 % — ABNORMAL LOW (ref 36.0–46.0)
Hemoglobin: 10 g/dL — ABNORMAL LOW (ref 12.0–15.0)
Lymphocytes Relative: 8 %
Lymphs Abs: 6.6 10*3/uL — ABNORMAL HIGH (ref 0.7–4.0)
MCH: 31.4 pg (ref 26.0–34.0)
MCHC: 31.8 g/dL (ref 30.0–36.0)
MCV: 98.7 fL (ref 80.0–100.0)
Metamyelocytes Relative: 6 %
Monocytes Absolute: 7.4 10*3/uL — ABNORMAL HIGH (ref 0.1–1.0)
Monocytes Relative: 9 %
Myelocytes: 9 %
Neutro Abs: 53.3 10*3/uL — ABNORMAL HIGH (ref 1.7–7.7)
Neutrophils Relative %: 59 %
Platelets: 355 10*3/uL (ref 150–400)
Promyelocytes Relative: 2 %
RBC: 3.18 MIL/uL — ABNORMAL LOW (ref 3.87–5.11)
RDW: 19.4 % — ABNORMAL HIGH (ref 11.5–15.5)
WBC: 82 10*3/uL (ref 4.0–10.5)
nRBC: 1 /100 WBC — ABNORMAL HIGH
nRBC: 1.8 % — ABNORMAL HIGH (ref 0.0–0.2)

## 2020-11-09 LAB — SAVE SMEAR(SSMR), FOR PROVIDER SLIDE REVIEW

## 2020-11-09 LAB — URIC ACID: Uric Acid, Serum: 8.4 mg/dL — ABNORMAL HIGH (ref 2.5–7.1)

## 2020-11-09 LAB — FIBRINOGEN: Fibrinogen: 384 mg/dL (ref 210–475)

## 2020-11-09 LAB — LACTATE DEHYDROGENASE: LDH: 788 U/L — ABNORMAL HIGH (ref 98–192)

## 2020-11-09 NOTE — Patient Instructions (Addendum)
[place] at [facility] Discharge Instructions  You were seen today by [name].  He is a Merchandiser, retail.  He studies cancer and issues with the blood.  He discussed your family history, your medical history and the events that led to you being here today.  He wants to do some lab work today.  You had some tenderness when he was doing your assessment in the right upper quadrant of your abdomen.  He wants to do an ultrasound to rule out anything serious in that area.  We will see you back in 1 week to review the results of the [place] and blood work.  He talked with you about potential concerns, if your blood work shows an acute leukemia, we will have to refer you to [facility] for further management.     Thank you for choosing [facility] at [facility] to provide your oncology and hematology care.  To afford each patient quality time with our provider, please arrive at least 15 minutes before your scheduled appointment time.   If you have a lab appointment with the [facility] please come in thru the Main Entrance and check in at the main information desk.  You need to re-schedule your appointment should you arrive 10 or more minutes late.  We strive to give you quality time with our providers, and arriving late affects you and other patients whose appointments are after yours.  Also, if you no show three or more times for appointments you may be dismissed from the clinic at the providers discretion.     Again, thank you for choosing [facility].  Our hope is that these requests will decrease the amount of time that you wait before being seen by our physicians.       _____________________________________________________________  Should you have questions after your visit to [facility], please contact our office at [phone] and follow the prompts.  Our office hours are 8:00 a.m. and 4:30 p.m.  Monday - Friday.  Please note that voicemails left after 4:00 p.m. may not be returned until the following business day.  We are closed weekends and major holidays.  You do have access to a nurse 24-7, just call the main number to the clinic [phone] and do not press any options, hold on the line and a nurse will answer the phone.    For prescription refill requests, have your pharmacy contact our office and allow 72 hours.    Due to Covid, you will need to wear a mask upon entering the hospital. If you do not have a mask, a mask will be given to you at the Main Entrance upon arrival. For doctor visits, patients may have 1 support person age 35 or older with them. For treatment visits, patients can not have anyone with them due to social distancing guidelines and our immunocompromised population.

## 2020-11-09 NOTE — Progress Notes (Signed)
[place],  [id number]   CLINIC:  Medical Oncology/Hematology  Patient Care Team: [name], MD as PCP - General Rourk, [name], MD (Gastroenterology) Carole Civil, MD as Consulting Physician (Orthopedic Surgery) Kassie Mends, RN as [place] Management  CHIEF COMPLAINTS/PURPOSE OF CONSULTATION:  Evaluation of high white blood cell count  HISTORY OF PRESENTING ILLNESS:  Renee Waters 75 y.o. female is here because of evaluation of high WBC count, at the request of the ED. Her last colonoscopy was on 04/30/2020.  Today she reports feeling fair, and she is accompanied by her partner and 4 of her children via telephone. She reports losing 25 pounds in the past 6 months. Her appetite is poor and she reports severe fatigue for the past 6 months. She presented to the ED on 11/06/20 upon suggestion from Dr. Theador Hawthorne following abnormal labs. She reports intermittent night sweats having occurred 3 times over the past month. She denies any prior history of cancer. She is active at home. She reports occasional mild cough. She reports a rash on her chest and back which has resolved, and she denies any current lumps. She reports occasional right side abdominal pain and mouth sores. 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 Her mother had leukemia. She quit smoking 6 months ago after smoking 1 pack per week for 50 years.   MEDICAL HISTORY:  Past Medical History:  Diagnosis Date   Acute cholangitis    Allergy    Anemia    Anxiety    Arthritis    Phreesia 03/18/2020   Barrett's esophagus    Cataract    Chronic back pain    Chronic neck pain    CKD (chronic kidney disease) stage 3, GFR 30-59 ml/min ([place]) 10/29/2020   Depression    Genital herpes    GERD (gastroesophageal reflux disease)    H/O degenerative disc disease    History of blood transfusion    Hypertension    Insomnia    Lupus (systemic lupus  erythematosus) ([place])    Neuromuscular disorder ([place])    Osteoarthritis    S/P colonoscopy June 2005   normal, no polyps   S/P endoscopy June 2005, Oct 2009   2005: short-segment Barrett's, 2009: short-segment Barrett's   UTI (lower urinary tract infection) 11/2012    SURGICAL HISTORY: Past Surgical History:  Procedure Laterality Date   ABDOMINAL HYSTERECTOMY     BACK SURGERY     BIOPSY N/A 03/20/2014   Procedure: BIOPSY;  Surgeon: Daneil Dolin, MD;  Location: [facility];  Service: Endoscopy;  Laterality: N/A;   BIOPSY  09/14/2015   Procedure: BIOPSY;  Surgeon: Daneil Dolin, MD;  Location[facility];  Service: Endoscopy;;  esophageal and gastric   BIOPSY  07/23/2019   Procedure: BIOPSY;  Surgeon: Daneil Dolin, MD;  Location[facility];  Service: Endoscopy;;  esophageal    CARPAL TUNNEL RELEASE Left 2013   cervical disectomy  2002   CESAREAN SECTION N/A    Phreesia 03/18/2020   CHOLECYSTECTOMY     with lysis of adhesions for sbo; "ruptured gallbladder".   COLONOSCOPY  11/09/2011   RMR: Melanosis coli   COLONOSCOPY WITH PROPOFOL N/A 09/14/2015   Dr. Gala Romney: diverticulosis, 40m TA removed. next TCS 09/2020.    COLONOSCOPY WITH PROPOFOL N/A 04/30/2020   Procedure: COLONOSCOPY WITH PROPOFOL;  Surgeon: RDaneil Dolin MD;  [facility];  Service:  Endoscopy;  Laterality: N/A;  PM (ASA 3)   DENTAL SURGERY  11/2015   multiple tooth extraction   ESOPHAGOGASTRODUODENOSCOPY  11/29/2007   salmon-colored  tongue   longest stable at  3 cm, distal esophagus as described previously status post biopsy/ Hiatal hernia, otherwise normal stomach D1 and D2   ESOPHAGOGASTRODUODENOSCOPY  01/06/11   short segment Barrett's esophagus s/p bx/Hiatal hernia   ESOPHAGOGASTRODUODENOSCOPY (EGD) WITH PROPOFOL N/A 03/20/2014   TWS:FKCLEXNT distal esophagus short segment barrett's, bx with no dysplasia. next egd in 03/2017   ESOPHAGOGASTRODUODENOSCOPY (EGD) WITH PROPOFOL N/A 09/14/2015   Dr. Gala Romney:  Barrett's without dysplasia, gastritis benign bx, hiatal hernia. next EGD 09/2018.   ESOPHAGOGASTRODUODENOSCOPY (EGD) WITH PROPOFOL N/A 07/23/2019   Procedure: ESOPHAGOGASTRODUODENOSCOPY (EGD) WITH PROPOFOL;  Surgeon: Daneil Dolin, MD;  [facility];  Service: Endoscopy;  Laterality: N/A;  3:00pm   EYE SURGERY N/A    Phreesia 03/18/2020   HERNIA REPAIR Right 07/2010   Dr. Zada Girt   JOINT REPLACEMENT     LAPAROSCOPIC CHOLECYSTECTOMY  2017   at [facility]   POLYPECTOMY  09/14/2015   Procedure: POLYPECTOMY;  Surgeon: Daneil Dolin, MD;  [facility];  Service: Endoscopy;;  ascending colon   right hip replacement  07/2010   went back in sept 2012 to fix   SHOULDER ARTHROSCOPY  2008   left   SPINE SURGERY N/A    Phreesia 03/18/2020   TOTAL HIP REVISION Right 12/17/2012   Procedure: RIGHT TOTAL HIP REVISION;  Surgeon: Mauri Pole, MD;  Location: [facility];  Service: Orthopedics;  Laterality: Right;   WRIST SURGERY Right 2011   open reduction right wrist.    SOCIAL HISTORY: Social History   Socioeconomic History   Marital status: Married    Spouse name: louis   Number of children: 4   Years of education: 12+   Highest education level: Some college, no degree  Occupational History   Occupation: Press photographer  - retired   Occupation: non profit  Tobacco Use   Smoking status: Former    Packs/day: 0.25    Years: 25.00    Pack years: 6.25    Types: Cigarettes    Quit date: 02/07/2003    Years since quitting: 17.7   Smokeless tobacco: Never   Tobacco comments:    quit in 2004  Vaping Use   Vaping Use: Never used  Substance and Sexual Activity   Alcohol use: No   Drug use: No   Sexual activity: Yes    Birth control/protection: Surgical  Other Topics Concern   Not on file  Social History Narrative   Not on file   Social Determinants of Health   Financial Resource Strain: Low Risk    Difficulty of Paying Living Expenses: Not hard at all  Food Insecurity:  No Food Insecurity   Worried About Charity fundraiser in the Last Year: Never true   Arboriculturist in the Last Year: Never true  Transportation Needs: Unmet Transportation Needs   Lack of Transportation (Medical): Yes   Lack of Transportation (Non-Medical): Yes  Physical Activity: Sufficiently Active   Days of Exercise per Week: 7 days   Minutes of Exercise per Session: 30 min  Stress: No Stress Concern Present   Feeling of Stress : Not at all  Social Connections: Moderately Integrated   Frequency of Communication with Friends and Family: More than three times a week   Frequency of Social Gatherings with Friends  and Family: Once a week   Attends Religious Services: More than 4 times per year   Active Member of Clubs or Organizations: No   Attends Music therapist: Never   Marital Status: Married  Human resources officer Violence: Not At Risk   Fear of Current or Ex-Partner: No   Emotionally Abused: No   Physically Abused: No   Sexually Abused: No    FAMILY HISTORY: Family History  Problem Relation Age of Onset   Hypertension Mother    Stroke Mother    Colon cancer Neg Hx    Anesthesia problems Neg Hx    Hypotension Neg Hx    Malignant hyperthermia Neg Hx    Pseudochol deficiency Neg Hx    Gastric cancer Neg Hx    Esophageal cancer Neg Hx     ALLERGIES:  is allergic to bee venom, acetaminophen, amlodipine, tyloxapol, other, and oxycodone-acetaminophen.  MEDICATIONS:  Current Outpatient Medications  Medication Sig Dispense Refill   acyclovir (ZOVIRAX) 400 MG tablet Take 1 tablet (400 mg total) by mouth 2 (two) times daily. 60 tablet 3   ALPRAZolam (XANAX) 0.5 MG tablet Take 1 tablet (0.5 mg total) by mouth at bedtime. 30 tablet 5   EPINEPHRINE 0.3 mg/0.3 mL IJ SOAJ injection INJECT 0.3 MLS INTO MUSCLE ONCE AS NEEDED (Patient taking differently: Inject 0.3 mg into the muscle as needed for anaphylaxis.) 1 Device 2   gabapentin (NEURONTIN) 300 MG capsule TAKE 1  CAPSULE BY MOUTH ONCE DAILY FOR  CHRONIC  PAIN (Patient taking differently: TAKE 1 CAPSULE BY MOUTH ONCE DAILY FOR  CHRONIC  PAIN) 90 capsule 0   hydrochlorothiazide (HYDRODIURIL) 25 MG tablet Take 1 tablet by mouth once daily 90 tablet 0   losartan (COZAAR) 25 MG tablet Take 1 tablet by mouth once daily for blood pressure 30 tablet 0   Oxycodone HCl 10 MG TABS Take 1 tablet (10 mg total) by mouth 4 (four) times daily. (Patient taking differently: Take 10 mg by mouth 4 (four) times daily as needed (pain).) 56 tablet 0   pantoprazole (PROTONIX) 40 MG tablet TAKE 1 TABLET BY MOUTH ONCE DAILY BEFORE BREAKFAST 90 tablet 3   Probiotic Product (EQL DAILY PROBIOTIC PO) Take 1 capsule by mouth daily.     temazepam (RESTORIL) 30 MG capsule Take 1 capsule (30 mg total) by mouth at bedtime. 30 capsule 0   tiZANidine (ZANAFLEX) 4 MG tablet Take 8 mg by mouth 3 (three) times daily as needed (spasms). 180 tablet 0   valsartan (DIOVAN) 80 MG tablet Take by mouth.     potassium chloride (KLOR-CON) 10 MEQ tablet potassium chloride ER 10 mEq tablet,extended release     topiramate (TOPAMAX) 50 MG tablet topiramate 50 mg tablet     No current facility-administered medications for this visit.    REVIEW OF SYSTEMS:   Review of Systems  Constitutional:  Positive for appetite change (no appetite) and fatigue (depleted).  HENT:   Positive for mouth sores. Negative for lump/mass.   Respiratory:  Positive for cough (mild).   Gastrointestinal:  Positive for abdominal pain (R side).  Genitourinary:  Positive for difficulty urinating (urgency).   Skin:  Positive for rash (resolved).  Psychiatric/Behavioral:  Positive for depression and sleep disturbance. The patient is nervous/anxious.   All other systems reviewed and are negative.   PHYSICAL EXAMINATION: ECOG PERFORMANCE STATUS: 1 - Symptomatic but completely ambulatory  Vitals:   11/09/20 1422  BP: (!) 178/85  Pulse: 80  Resp: 16  Temp: 98.6 F (37 C)   SpO2: 100%   Filed Weights   11/09/20 1422  Weight: 116 lb 9.6 oz (52.9 kg)   Physical Exam Vitals reviewed.  Constitutional:      Appearance: Normal appearance.  HENT:     Mouth/Throat:     Mouth: No oral lesions.     Tongue: No lesions.  Cardiovascular:     Rate and Rhythm: Normal rate and regular rhythm.     Pulses: Normal pulses.     Heart sounds: Normal heart sounds.  Pulmonary:     Effort: Pulmonary effort is normal.     Breath sounds: Normal breath sounds.  Abdominal:     Palpations: Abdomen is soft. There is no hepatomegaly, splenomegaly or mass.     Tenderness: There is abdominal tenderness in the right upper quadrant.  Lymphadenopathy:     Lower Body: No right inguinal adenopathy. No left inguinal adenopathy.  Neurological:     General: No focal deficit present.     Mental Status: She is alert and oriented to person, place, and time.  Psychiatric:        Mood and Affect: Mood normal.        Behavior: Behavior normal.     LABORATORY DATA:  I have reviewed the data as listed Recent Results (from the past 2160 hour(s))  Lipid Profile     Status: Abnormal   Collection Time: 10/05/20  9:59 AM  Result Value Ref Range   Cholesterol, Total 95 (L) 100 - 199 mg/dL   Triglycerides 87 0 - 149 mg/dL   HDL 27 (L) >39 mg/dL   VLDL Cholesterol Cal 17 5 - 40 mg/dL   LDL Chol Calc (NIH) 51 0 - 99 mg/dL   Chol/HDL Ratio 3.5 0.0 - 4.4 ratio    Comment:                                   T. Chol/HDL Ratio                                             Men  Women                               1/2 Avg.Risk  3.4    3.3                                   Avg.Risk  5.0    4.4                                2X Avg.Risk  9.6    7.1                                3X Avg.Risk 23.4   11.0   CMP14+EGFR     Status: Abnormal   Collection Time: 10/05/20  9:59 AM  Result Value Ref Range   Glucose 85 65 - 99 mg/dL   BUN 17 8 - 27 mg/dL   Creatinine, Ser 1.39 (H) 0.57 - 1.00 mg/dL  eGFR  40 (L) >59 mL/min/1.73   BUN/Creatinine Ratio 12 12 - 28   Sodium 141 134 - 144 mmol/L   Potassium 4.1 3.5 - 5.2 mmol/L   Chloride 100 96 - 106 mmol/L   CO2 21 20 - 29 mmol/L   Calcium 8.1 (L) 8.7 - 10.3 mg/dL   Total Protein 7.9 6.0 - 8.5 g/dL   Albumin 3.9 3.7 - 4.7 g/dL   Globulin, Total 4.0 1.5 - 4.5 g/dL   Albumin/Globulin Ratio 1.0 (L) 1.2 - 2.2   Bilirubin Total 0.4 0.0 - 1.2 mg/dL   Alkaline Phosphatase 196 (H) 44 - 121 IU/L   AST 39 0 - 40 IU/L   ALT 15 0 - 32 IU/L  Vitamin D (25 hydroxy)     Status: None   Collection Time: 10/05/20  9:59 AM  Result Value Ref Range   Vit D, 25-Hydroxy 71.5 30.0 - 100.0 ng/mL    Comment: Vitamin D deficiency has been defined by the Institute of Medicine and an Endocrine Society practice guideline as a level of serum 25-OH vitamin D less than 20 ng/mL (1,2). The Endocrine Society went on to further define vitamin D insufficiency as a level between 21 and 29 ng/mL (2). 1. IOM (Institute of Medicine). 2010. Dietary reference    intakes for calcium and D. [place]: The    [employer]. 2. Holick MF, Binkley [place], Bischoff-Ferrari HA, et al.    Evaluation, treatment, and prevention of vitamin D    deficiency: an Endocrine Society clinical practice    guideline. JCEM. 2011 Jul; 96(7):1911-30.   TSH     Status: None   Collection Time: 10/05/20  9:59 AM  Result Value Ref Range   TSH 3.730 0.450 - 4.500 uIU/mL  CMP14+EGFR     Status: Abnormal   Collection Time: 10/21/20 11:35 AM  Result Value Ref Range   Glucose 65 65 - 99 mg/dL   BUN 22 8 - 27 mg/dL   Creatinine, Ser 1.56 (H) 0.57 - 1.00 mg/dL   eGFR 35 (L) >59 mL/min/1.73   BUN/Creatinine Ratio 14 12 - 28   Sodium 140 134 - 144 mmol/L   Potassium 4.1 3.5 - 5.2 mmol/L   Chloride 102 96 - 106 mmol/L   CO2 19 (L) 20 - 29 mmol/L   Calcium 9.1 8.7 - 10.3 mg/dL   Total Protein 7.8 6.0 - 8.5 g/dL   Albumin 3.9 3.7 - 4.7 g/dL   Globulin, Total 3.9 1.5 - 4.5 g/dL    Albumin/Globulin Ratio 1.0 (L) 1.2 - 2.2   Bilirubin Total 0.3 0.0 - 1.2 mg/dL   Alkaline Phosphatase 194 (H) 44 - 121 IU/L   AST 41 (H) 0 - 40 IU/L   ALT 14 0 - 32 IU/L  Urinalysis, Routine w reflex microscopic Nasopharyngeal Swab     Status: Abnormal   Collection Time: 11/06/20  6:47 PM  Result Value Ref Range   Color, Urine YELLOW YELLOW   APPearance CLEAR CLEAR   Specific Gravity, Urine 1.012 1.005 - 1.030   pH 6.0 5.0 - 8.0   Glucose, UA NEGATIVE NEGATIVE mg/dL   Hgb urine dipstick NEGATIVE NEGATIVE   Bilirubin Urine NEGATIVE NEGATIVE   Ketones, ur NEGATIVE NEGATIVE mg/dL   Protein, ur 30 (A) NEGATIVE mg/dL   Nitrite NEGATIVE NEGATIVE   Leukocytes,Ua TRACE (A) NEGATIVE   RBC / HPF 0-5 0 - 5 RBC/hpf   WBC, UA 0-5 0 - 5 WBC/hpf   Bacteria,  UA NONE SEEN NONE SEEN   Squamous Epithelial / LPF 0-5 0 - 5    Comment: Performed at [facility], [address], [place], [place] [address]  Resp Panel by RT-PCR (Flu A&B, Covid) Nasopharyngeal Swab     Status: None   Collection Time: 11/06/20  6:51 PM   Specimen: Nasopharyngeal Swab; Nasopharyngeal(NP) swabs in vial transport medium  Result Value Ref Range   SARS Coronavirus 2 by RT PCR NEGATIVE NEGATIVE    Comment: (NOTE) SARS-CoV-2 target nucleic acids are NOT DETECTED.  The SARS-CoV-2 RNA is generally detectable in upper respiratory specimens during the acute phase of infection. The lowest concentration of SARS-CoV-2 viral copies this assay can detect is 138 copies/mL. A negative result does not preclude SARS-Cov-2 infection and should not be used as the sole basis for treatment or other patient management decisions. A negative result may occur with  improper specimen collection/handling, submission of specimen other than nasopharyngeal swab, presence of viral mutation(s) within the areas targeted by this assay, and inadequate number of viral copies(<138 copies/mL). A negative result must be combined with clinical observations,  patient history, and epidemiological information. The expected result is Negative.  Fact Sheet for Patients:  EntrepreneurPulse.com.au  Fact Sheet for Healthcare Providers:  IncredibleEmployment.be  This test is no t yet approved or cleared by the [place] FDA and  has been authorized for detection and/or diagnosis of SARS-CoV-2 by FDA under an Emergency Use Authorization (EUA). This EUA will remain  in effect (meaning this test can be used) for the duration of the COVID-19 declaration under Section 564(b)(1) of the Act, 21 U.S.C.section 360bbb-3(b)(1), unless the authorization is terminated  or revoked sooner.       Influenza A by PCR NEGATIVE NEGATIVE   Influenza B by PCR NEGATIVE NEGATIVE    Comment: (NOTE) The Xpert Xpress SARS-CoV-2/FLU/RSV plus assay is intended as an aid in the diagnosis of influenza from Nasopharyngeal swab specimens and should not be used as a sole basis for treatment. Nasal washings and aspirates are unacceptable for Xpert Xpress SARS-CoV-2/FLU/RSV testing.  Fact Sheet for Patients: EntrepreneurPulse.com.au  Fact Sheet for Healthcare Providers: IncredibleEmployment.be  This test is not yet approved or cleared by the [place] FDA and has been authorized for detection and/or diagnosis of SARS-CoV-2 by FDA under an Emergency Use Authorization (EUA). This EUA will remain in effect (meaning this test can be used) for the duration of the COVID-19 declaration under Section 564(b)(1) of the Act, 21 U.S.C. section 360bbb-3(b)(1), unless the authorization is terminated or revoked.  Performed at [facility], [address], [place], [place] [address]   CBC with Differential     Status: Abnormal   Collection Time: 11/06/20  7:19 PM  Result Value Ref Range   WBC 75.6 (HH) 4.0 - 10.5 K/uL    Comment: REPEATED TO VERIFY THIS CRITICAL RESULT HAS VERIFIED AND BEEN CALLED TO  PRUITT,G BY JAMIE WOODIE ON 09 30 2022 AT 1947, AND HAS BEEN READ BACK.     RBC 3.15 (L) 3.87 - 5.11 MIL/uL   Hemoglobin 10.1 (L) 12.0 - 15.0 g/dL   HCT 30.9 (L) 36.0 - 46.0 %   MCV 98.1 80.0 - 100.0 fL   MCH 32.1 26.0 - 34.0 pg   MCHC 32.7 30.0 - 36.0 g/dL   RDW 19.1 (H) 11.5 - 15.5 %   Platelets 296 150 - 400 K/uL   nRBC 1.2 (H) 0.0 - 0.2 %   Neutrophils Relative % 59 %  Comment: Performed at [facility], [place] [address] [place], [place] [address] CORRECTED ON 10/03 AT 1353: PREVIOUSLY REPORTED AS 45    Band Neutrophils 6 %    Comment: Performed at [facility], [address] [place], [place] [address]   Lymphocytes Relative 4 %    Comment: Performed at [facility], [place] [address] [place], [place] [address] CORRECTED ON 10/03 AT 1353: PREVIOUSLY REPORTED AS 7    Monocytes Relative 12 %    Comment: Performed at [facility], [place] [address] [place], [place] [address] CORRECTED ON 10/03 AT 1353: PREVIOUSLY REPORTED AS 13    Eosinophils Relative 1 %    Comment: Performed at [facility], [place] [address] [place], [place] [address] CORRECTED ON 10/03 AT 1353: PREVIOUSLY REPORTED AS 0    Basophils Relative 1 %    Comment: Performed at [facility], [place] [address] [place], [place] [address] CORRECTED ON 10/03 AT 1353: PREVIOUSLY REPORTED AS 0    nRBC 2 (H) 0 /100 WBC    Comment: Performed at [facility], [address] [place], [place] [address]   Metamyelocytes Relative 4 %    Comment: Performed at [facility], [place] [address], [place], [place] [address]   Myelocytes 12 %    Comment: Performed at [facility], [place] [address], [place], [place] [address]   Promyelocytes Relative 1 %    Comment: Performed at [facility], [place] [address], [place], [place] [address]   Blasts 29 %    Comment: Performed at [facility],  [address] [place], [place] [address]  Type and screen     Status: None   Collection Time: 11/06/20  7:19 PM  Result Value Ref Range   ABO/RH(D) O POS    Antibody Screen POS    Sample Expiration      11/09/2020,2359 Performed at [facility], [address] [place] [address] [place], [place] [address]   Lactate dehydrogenase     Status: Abnormal   Collection Time: 11/06/20  7:19 PM  Result Value Ref Range   LDH 589 (H) 98 - 192 U/L    Comment: Performed at [facility], [address] [place], [place] [address]  Uric acid     Status: Abnormal   Collection Time: 11/06/20  7:19 PM  Result Value Ref Range   Uric Acid, Serum 8.2 (H) 2.5 - 7.1 mg/dL    Comment: Performed at [facility], [address] [place], [place] [address]  Protime-INR     Status: Abnormal   Collection Time: 11/06/20  7:19 PM  Result Value Ref Range   Prothrombin Time 16.9 (H) 11.4 - 15.2 seconds   INR 1.4 (H) 0.8 - 1.2    Comment: (NOTE) INR goal varies based on device and disease states. Performed at [facility], [address] [place], [place] [address]   APTT     Status: None   Collection Time: 11/06/20  7:19 PM  Result Value Ref Range   aPTT 36 24 - 36 seconds    Comment: Performed at [facility], [address], [place], [place] [address]  Pathologist smear review     Status: None   Collection Time: 11/06/20  7:19 PM  Result Value Ref Range   Path Review 11/09/2020     Comment: NORMOCYTIC ANEMIA. MARKED LEUKOCYTOSIS WITH LEUKOERYTHROBLASTIC REACTION. Reviewed By Violet Baldy, M.D. Performed at [facility], [place] [address], [place], [place] [address]   Comprehensive metabolic panel     Status: Abnormal   Collection Time: 11/06/20  7:34 PM  Result Value Ref Range   Sodium 140 135 - 145 mmol/L   Potassium 4.2 3.5 - 5.1 mmol/L   Chloride 107 98 - 111 mmol/L   CO2 25 22 - 32 mmol/L   Glucose, Bld 79 70 - 99 mg/dL    Comment: Glucose reference range applies only to samples taken after fasting for  at least 8 hours.   BUN 17 8 - 23 mg/dL   Creatinine, Ser 1.37 (H) 0.44 - 1.00 mg/dL   Calcium 8.7 (L) 8.9 - 10.3 mg/dL   Total Protein 8.6 (H) 6.5 - 8.1 g/dL   Albumin 3.9 3.5 - 5.0 g/dL   AST 33 15 - 41 U/L   ALT 15 0 - 44 U/L   Alkaline Phosphatase 186 (H) 38 - 126 U/L   Total Bilirubin 0.7 0.3 - 1.2 mg/dL   GFR, Estimated 41 (L) >60 mL/min    Comment: (NOTE) Calculated using the CKD-EPI Creatinine Equation (2021)    Anion gap 8 5 - 15    Comment: Performed at [facility], [address] [place], [place] [address]  ABO/Rh     Status: None   Collection Time: 11/06/20  7:34 PM  Result Value Ref Range   ABO/RH(D)      Jenetta Downer POS Performed at [facility], [address] [place], [place] [address]     RADIOGRAPHIC STUDIES: I have personally reviewed the radiological images as listed and agreed with the findings in the report. No results found.  ASSESSMENT:  Leukocytosis with left shift: - CBC on 11/06/2020 with white count 75.6, differential 59% neutrophils, 12% monocytes, 4% lymphocytes, 1 percentage of eosinophils and basophils, 6% band neutrophils, 12% myelocytes, 1% promyelocytes, 29% blasts. - Pathologist review of blood smear reported as leukoerythroblastic reaction. - 25 pound weight loss in the last 6 months, unintentional.  Decreased appetite.  Reports fatigue for the last few months.  Reports night sweats x3 in the last 1 month.  2.  Social/family history: - She lives at home and is able to do all her ADLs and IADLs although she is getting tired lately.  She reports quitting smoking 6 months ago and smoked 1 pack/week for 43 years. - She believes that her mother had some kind of leukemia.  No other malignancies.   PLAN:  Leukoerythroblastic blood picture: - Reviewed lab results with the patient and her children over the phone. - Recommend LDH, uric acid, CBC, fibrinogen levels. - Recommend flow cytometry. - If any evidence of acute leukemia, she will be referred to  tertiary center.  2.  Right upper quadrant tenderness: - She had severe tenderness in the right upper quadrant region.  Denies any constant pain. - LFTs on 11/06/2020 were normal.  Recommend right upper quadrant ultrasound.  3.  CKD: - Creatinine between 1.3-1.5.  She follows up with Dr. Theador Hawthorne.  Latest creatinine was 1.37.   All questions were answered. The patient knows to call the clinic with any problems, questions or concerns.  Derek Jack, MD 11/09/20 2:44 PM  [place] [phone]   I, Thana Ates, am acting as a scribe for Dr. Derek Jack.  I, Derek Jack MD, have reviewed the above documentation for accuracy and completeness, and I agree with the above.

## 2020-11-11 ENCOUNTER — Encounter (HOSPITAL_COMMUNITY): Payer: Self-pay

## 2020-11-11 LAB — SURGICAL PATHOLOGY

## 2020-11-11 NOTE — Progress Notes (Signed)
Patient called stating that she feels as if waiting until next week for her [place] is dragging and she would prefer to do it earlier. Phone number for Central Scheduling provided to patient to see if there is an earlier availability at another location. I have reached out to Flow Cytometry to see when results will be available also.

## 2020-11-12 ENCOUNTER — Other Ambulatory Visit (HOSPITAL_COMMUNITY): Payer: Self-pay

## 2020-11-12 DIAGNOSIS — D72829 Elevated white blood cell count, unspecified: Secondary | ICD-10-CM

## 2020-11-12 NOTE — Progress Notes (Signed)
Bone marrow biopsy orders placed per [name] verbal order

## 2020-11-13 ENCOUNTER — Other Ambulatory Visit (HOSPITAL_COMMUNITY): Payer: Self-pay

## 2020-11-13 DIAGNOSIS — D72829 Elevated white blood cell count, unspecified: Secondary | ICD-10-CM

## 2020-11-13 NOTE — Progress Notes (Signed)
BCR/ABL lab order placed per verbal order from [name]

## 2020-11-16 ENCOUNTER — Other Ambulatory Visit (HOSPITAL_COMMUNITY): Payer: Self-pay | Admitting: Physician Assistant

## 2020-11-17 ENCOUNTER — Ambulatory Visit (HOSPITAL_COMMUNITY)
Admission: RE | Admit: 2020-11-17 | Discharge: 2020-11-17 | Disposition: A | Discharge status: Home / Self Care | Payer: Medicare Other | Source: Ambulatory Visit | Attending: Hematology | Admitting: Hematology

## 2020-11-17 ENCOUNTER — Other Ambulatory Visit: Payer: Self-pay

## 2020-11-17 DIAGNOSIS — R10811 Right upper quadrant abdominal tenderness: Secondary | ICD-10-CM | POA: Diagnosis not present

## 2020-11-18 ENCOUNTER — Ambulatory Visit (HOSPITAL_COMMUNITY)
Admission: RE | Admit: 2020-11-18 | Discharge: 2020-11-18 | Disposition: A | Discharge status: Home / Self Care | Payer: Medicare Other | Source: Ambulatory Visit | Attending: Hematology | Admitting: Hematology

## 2020-11-18 ENCOUNTER — Encounter (HOSPITAL_COMMUNITY): Payer: Self-pay

## 2020-11-18 DIAGNOSIS — D72829 Elevated white blood cell count, unspecified: Secondary | ICD-10-CM | POA: Insufficient documentation

## 2020-11-18 DIAGNOSIS — Z87891 Personal history of nicotine dependence: Secondary | ICD-10-CM | POA: Diagnosis not present

## 2020-11-18 DIAGNOSIS — Z888 Allergy status to other drugs, medicaments and biological substances status: Secondary | ICD-10-CM | POA: Diagnosis not present

## 2020-11-18 DIAGNOSIS — N183 Chronic kidney disease, stage 3 unspecified: Secondary | ICD-10-CM | POA: Insufficient documentation

## 2020-11-18 DIAGNOSIS — D759 Disease of blood and blood-forming organs, unspecified: Secondary | ICD-10-CM | POA: Insufficient documentation

## 2020-11-18 DIAGNOSIS — Z79899 Other long term (current) drug therapy: Secondary | ICD-10-CM | POA: Diagnosis not present

## 2020-11-18 DIAGNOSIS — D649 Anemia, unspecified: Secondary | ICD-10-CM | POA: Diagnosis not present

## 2020-11-18 DIAGNOSIS — Z8249 Family history of ischemic heart disease and other diseases of the circulatory system: Secondary | ICD-10-CM | POA: Diagnosis not present

## 2020-11-18 DIAGNOSIS — I129 Hypertensive chronic kidney disease with stage 1 through stage 4 chronic kidney disease, or unspecified chronic kidney disease: Secondary | ICD-10-CM | POA: Insufficient documentation

## 2020-11-18 DIAGNOSIS — Z886 Allergy status to analgesic agent status: Secondary | ICD-10-CM | POA: Insufficient documentation

## 2020-11-18 DIAGNOSIS — D72823 Leukemoid reaction: Secondary | ICD-10-CM | POA: Diagnosis not present

## 2020-11-18 DIAGNOSIS — Z885 Allergy status to narcotic agent status: Secondary | ICD-10-CM | POA: Diagnosis not present

## 2020-11-18 LAB — CBC WITH DIFFERENTIAL/PLATELET
Abs Immature Granulocytes: 12.85 10*3/uL — ABNORMAL HIGH (ref 0.00–0.07)
Abs Immature Granulocytes: 11.4 10*3/uL — ABNORMAL HIGH (ref 0.00–0.07)
Band Neutrophils: 6 %
Band Neutrophils: 8 %
Basophils Absolute: 0.8 10*3/uL — ABNORMAL HIGH (ref 0.0–0.1)
Basophils Absolute: 0 10*3/uL (ref 0.0–0.1)
Basophils Relative: 1 %
Basophils Relative: 0 %
Blasts: 1 %
Blasts: 2 %
Eosinophils Absolute: 0.8 10*3/uL — ABNORMAL HIGH (ref 0.0–0.5)
Eosinophils Absolute: 0.8 10*3/uL — ABNORMAL HIGH (ref 0.0–0.5)
Eosinophils Relative: 1 %
Eosinophils Relative: 1 %
HCT: 30.9 % — ABNORMAL LOW (ref 36.0–46.0)
HCT: 27.9 % — ABNORMAL LOW (ref 36.0–46.0)
Hemoglobin: 10.1 g/dL — ABNORMAL LOW (ref 12.0–15.0)
Hemoglobin: 9.3 g/dL — ABNORMAL LOW (ref 12.0–15.0)
Lymphocytes Relative: 4 %
Lymphocytes Relative: 6 %
Lymphs Abs: 3 10*3/uL (ref 0.7–4.0)
Lymphs Abs: 4.9 10*3/uL — ABNORMAL HIGH (ref 0.7–4.0)
MCH: 32.1 pg (ref 26.0–34.0)
MCH: 32.2 pg (ref 26.0–34.0)
MCHC: 32.7 g/dL (ref 30.0–36.0)
MCHC: 33.3 g/dL (ref 30.0–36.0)
MCV: 98.1 fL (ref 80.0–100.0)
MCV: 96.5 fL (ref 80.0–100.0)
Metamyelocytes Relative: 4 %
Metamyelocytes Relative: 2 %
Monocytes Absolute: 9.1 10*3/uL — ABNORMAL HIGH (ref 0.1–1.0)
Monocytes Absolute: 13.8 10*3/uL — ABNORMAL HIGH (ref 0.1–1.0)
Monocytes Relative: 12 %
Monocytes Relative: 17 %
Myelocytes: 12 %
Myelocytes: 10 %
Neutro Abs: 48.4 10*3/uL — ABNORMAL HIGH (ref 1.7–7.7)
Neutro Abs: 48.7 10*3/uL — ABNORMAL HIGH (ref 1.7–7.7)
Neutrophils Relative %: 58 %
Neutrophils Relative %: 52 %
Platelets: 296 10*3/uL (ref 150–400)
Platelets: 237 10*3/uL (ref 150–400)
Promyelocytes Relative: 1 %
Promyelocytes Relative: 2 %
RBC: 3.15 MIL/uL — ABNORMAL LOW (ref 3.87–5.11)
RBC: 2.89 MIL/uL — ABNORMAL LOW (ref 3.87–5.11)
RDW: 19.1 % — ABNORMAL HIGH (ref 11.5–15.5)
RDW: 19.2 % — ABNORMAL HIGH (ref 11.5–15.5)
WBC: 75.6 10*3/uL (ref 4.0–10.5)
WBC: 81.2 10*3/uL (ref 4.0–10.5)
nRBC: 1.2 % — ABNORMAL HIGH (ref 0.0–0.2)
nRBC: 2 /100 WBC — ABNORMAL HIGH
nRBC: 0.9 % — ABNORMAL HIGH (ref 0.0–0.2)

## 2020-11-18 MED ORDER — FLUMAZENIL 0.5 MG/5ML IV SOLN
INTRAVENOUS | Status: AC
  Filled 2020-11-18: qty 5

## 2020-11-18 MED ORDER — OXYCODONE HCL 5 MG PO TABS
10.0000 mg | ORAL_TABLET | Freq: Once | ORAL | Status: AC
  Administered 2020-11-18: 10 mg via ORAL
  Filled 2020-11-18: qty 2

## 2020-11-18 MED ORDER — SODIUM CHLORIDE 0.9 % IV SOLN: INTRAVENOUS | Status: DC

## 2020-11-18 MED ORDER — MIDAZOLAM HCL 2 MG/2ML IJ SOLN
INTRAMUSCULAR | Status: DC | PRN
  Administered 2020-11-18: 1 mg via INTRAVENOUS

## 2020-11-18 MED ORDER — NALOXONE HCL 0.4 MG/ML IJ SOLN
INTRAMUSCULAR | Status: AC
  Filled 2020-11-18: qty 1

## 2020-11-18 MED ORDER — LIDOCAINE HCL (PF) 1 % IJ SOLN
INTRAMUSCULAR | Status: DC | PRN
  Administered 2020-11-18: 10 mL via INTRADERMAL

## 2020-11-18 MED ORDER — MIDAZOLAM HCL 2 MG/2ML IJ SOLN
INTRAMUSCULAR | Status: AC
  Filled 2020-11-18: qty 4

## 2020-11-18 MED ORDER — MIDAZOLAM HCL 2 MG/2ML IJ SOLN
INTRAMUSCULAR | Status: DC | PRN
  Administered 2020-11-18: .5 mg via INTRAVENOUS

## 2020-11-18 MED ORDER — FENTANYL CITRATE (PF) 100 MCG/2ML IJ SOLN
INTRAMUSCULAR | Status: DC | PRN
  Administered 2020-11-18: 50 ug via INTRAVENOUS

## 2020-11-18 MED ORDER — FENTANYL CITRATE (PF) 100 MCG/2ML IJ SOLN
INTRAMUSCULAR | Status: AC
  Filled 2020-11-18: qty 4

## 2020-11-18 NOTE — Consult Note (Addendum)
Chief Complaint: Patient was seen in consultation today for CT-guided bone marrow biopsy  Referring Physician(s): [place]  Supervising Physician: Aletta Edouard  Patient Status: [facility] - Out-pt  History of Present Illness: Renee Waters is a 75 y.o. female with past medical history of anxiety, depression, arthritis, Barrett's esophagus, chronic neck and back pain, chronic kidney disease, dysphagia, GERD, degenerative disc disease, hypertension, lupus, osteoarthritis.  She now presents with markedly elevated WBC along with weight loss, fatigue, diminished appetite and occasional night sweats.  Flow cytometry has revealed minor myeloblastic population/leukoerythroblastic reaction. She presents today for CT-guided bone marrow biopsy to rule out leukemia.    Past Medical History:  Diagnosis Date   Acute cholangitis    Allergy    Anemia    Anxiety    Arthritis    Phreesia 03/18/2020   Barrett's esophagus    Cataract    Chronic back pain    Chronic neck pain    CKD (chronic kidney disease) stage 3, GFR 30-59 ml/min ([place]) 10/29/2020   Depression    Genital herpes    GERD (gastroesophageal reflux disease)    H/O degenerative disc disease    History of blood transfusion    Hypertension    Insomnia    Lupus (systemic lupus erythematosus) ([place])    Neuromuscular disorder ([place])    Osteoarthritis    S/P colonoscopy June 2005   normal, no polyps   S/P endoscopy June 2005, Oct 2009   2005: short-segment Barrett's, 2009: short-segment Barrett's   UTI (lower urinary tract infection) 11/2012    Past Surgical History:  Procedure Laterality Date   ABDOMINAL HYSTERECTOMY     BACK SURGERY     BIOPSY N/A 03/20/2014   Procedure: BIOPSY;  Surgeon: Daneil Dolin, MD;  Location: [facility];  Service: Endoscopy;  Laterality: N/A;   BIOPSY  09/14/2015   Procedure: BIOPSY;  Surgeon: Daneil Dolin, MD;  Location: [facility];  Service: Endoscopy;;  esophageal and gastric   BIOPSY   07/23/2019   Procedure: BIOPSY;  Surgeon: Daneil Dolin, MD;  Location: [facility];  Service: Endoscopy;;  esophageal    CARPAL TUNNEL RELEASE Left 2013   cervical disectomy  2002   CESAREAN SECTION N/A    Phreesia 03/18/2020   CHOLECYSTECTOMY     with lysis of adhesions for sbo; "ruptured gallbladder".   COLONOSCOPY  11/09/2011   RMR: Melanosis coli   COLONOSCOPY WITH PROPOFOL N/A 09/14/2015   Dr. Gala Romney: diverticulosis, 79m TA removed. next TCS 09/2020.    COLONOSCOPY WITH PROPOFOL N/A 04/30/2020   Procedure: COLONOSCOPY WITH PROPOFOL;  Surgeon: RDaneil Dolin MD;  Location: [facility];  Service: Endoscopy;  Laterality: N/A;  PM (ASA 3)   DENTAL SURGERY  11/2015   multiple tooth extraction   ESOPHAGOGASTRODUODENOSCOPY  11/29/2007   salmon-colored  tongue   longest stable at  3 cm, distal esophagus as described previously status post biopsy/ Hiatal hernia, otherwise normal stomach D1 and D2   ESOPHAGOGASTRODUODENOSCOPY  01/06/11   short segment Barrett's esophagus s/p bx/Hiatal hernia   ESOPHAGOGASTRODUODENOSCOPY (EGD) WITH PROPOFOL N/A 03/20/2014   RZOX:WRUEAVWUdistal esophagus short segment barrett's, bx with no dysplasia. next egd in 03/2017   ESOPHAGOGASTRODUODENOSCOPY (EGD) WITH PROPOFOL N/A 09/14/2015   Dr. RGala Romney Barrett's without dysplasia, gastritis benign bx, hiatal hernia. next EGD 09/2018.   ESOPHAGOGASTRODUODENOSCOPY (EGD) WITH PROPOFOL N/A 07/23/2019   Procedure: ESOPHAGOGASTRODUODENOSCOPY (EGD) WITH PROPOFOL;  Surgeon: RDaneil Dolin MD;  Location: [facility];  Service: Endoscopy;  Laterality: N/A;  3:00pm   EYE SURGERY N/A    Phreesia 03/18/2020   HERNIA REPAIR Right 07/2010   Dr. Zada Girt   JOINT REPLACEMENT     LAPAROSCOPIC CHOLECYSTECTOMY  2017   at [facility]   POLYPECTOMY  09/14/2015   Procedure: POLYPECTOMY;  Surgeon: Daneil Dolin, MD;  Location: [facility];  Service: Endoscopy;;  ascending colon   right hip replacement  07/2010   went back in sept 2012 to  fix   SHOULDER ARTHROSCOPY  2008   left   SPINE SURGERY N/A    Phreesia 03/18/2020   TOTAL HIP REVISION Right 12/17/2012   Procedure: RIGHT TOTAL HIP REVISION;  Surgeon: Mauri Pole, MD;  Location: [facility];  Service: Orthopedics;  Laterality: Right;   WRIST SURGERY Right 2011   open reduction right wrist.    Allergies: Bee venom, Acetaminophen, Amlodipine, Tyloxapol, Other, and Oxycodone-acetaminophen  Medications: Prior to Admission medications   Medication Sig Start Date End Date Taking? Authorizing Provider  acyclovir (ZOVIRAX) 400 MG tablet Take 1 tablet (400 mg total) by mouth 2 (two) times daily. 07/30/20  Yes [name], MD  ALPRAZolam Duanne Moron) 0.5 MG tablet Take 1 tablet (0.5 mg total) by mouth at bedtime. 08/15/20  Yes [name], MD  gabapentin (NEURONTIN) 300 MG capsule TAKE 1 CAPSULE BY MOUTH ONCE DAILY FOR  CHRONIC  PAIN Patient taking differently: TAKE 1 CAPSULE BY MOUTH ONCE DAILY FOR  CHRONIC  PAIN 03/11/20  Yes [name], MD  hydrochlorothiazide (HYDRODIURIL) 25 MG tablet Take 1 tablet by mouth once daily 08/14/20  Yes [name], MD  losartan (COZAAR) 25 MG tablet Take 1 tablet by mouth once daily for blood pressure 10/09/20  Yes [name], MD  Oxycodone HCl 10 MG TABS Take 1 tablet (10 mg total) by mouth 4 (four) times daily. Patient taking differently: Take 10 mg by mouth 4 (four) times daily as needed (pain). 02/23/16  Yes [name], MD  pantoprazole (PROTONIX) 40 MG tablet TAKE 1 TABLET BY MOUTH ONCE DAILY BEFORE BREAKFAST 10/02/20  Yes Mahala Menghini, PA-C  potassium chloride (KLOR-CON) 10 MEQ tablet potassium chloride ER 10 mEq tablet,extended release   Yes [provider]  Probiotic Product (EQL DAILY PROBIOTIC PO) Take 1 capsule by mouth daily.   Yes [provider]  temazepam (RESTORIL) 30 MG capsule Take 1 capsule (30 mg total) by mouth at bedtime. 10/09/20  Yes [name], MD  tiZANidine  (ZANAFLEX) 4 MG tablet Take 8 mg by mouth 3 (three) times daily as needed (spasms). 08/05/19  Yes [name], MD  topiramate (TOPAMAX) 50 MG tablet topiramate 50 mg tablet   Yes [provider]  valsartan (DIOVAN) 80 MG tablet Take by mouth. 02/25/15  Yes [provider]  EPINEPHRINE 0.3 mg/0.3 mL IJ SOAJ injection INJECT 0.3 MLS INTO MUSCLE ONCE AS NEEDED Patient taking differently: Inject 0.3 mg into the muscle as needed for anaphylaxis. 10/04/16   [name], MD     Family History  Problem Relation Age of Onset   Hypertension Mother    Stroke Mother    Colon cancer Neg Hx    Anesthesia problems Neg Hx    Hypotension Neg Hx    Malignant hyperthermia Neg Hx    Pseudochol deficiency Neg Hx    Gastric cancer Neg Hx    Esophageal cancer Neg Hx     Social History  Socioeconomic History   Marital status: Married    Spouse name: louis   Number of children: 4   Years of education: 12+   Highest education level: Some college, no degree  Occupational History   Occupation: Press photographer  - retired   Occupation: non profit  Tobacco Use   Smoking status: Former    Packs/day: 0.25    Years: 25.00    Pack years: 6.25    Types: Cigarettes    Quit date: 02/07/2003    Years since quitting: 17.7   Smokeless tobacco: Never   Tobacco comments:    quit in 2004  Vaping Use   Vaping Use: Never used  Substance and Sexual Activity   Alcohol use: No   Drug use: No   Sexual activity: Yes    Birth control/protection: Surgical  Other Topics Concern   Not on file  Social History Narrative   Not on file   Social Determinants of Health   Financial Resource Strain: Low Risk    Difficulty of Paying Living Expenses: Not hard at all  Food Insecurity: No Food Insecurity   Worried About Charity fundraiser in the Last Year: Never true   Arboriculturist in the Last Year: Never true  Transportation Needs: Unmet Transportation Needs   Lack of Transportation  (Medical): Yes   Lack of Transportation (Non-Medical): Yes  Physical Activity: Sufficiently Active   Days of Exercise per Week: 7 days   Minutes of Exercise per Session: 30 min  Stress: No Stress Concern Present   Feeling of Stress : Not at all  Social Connections: Moderately Integrated   Frequency of Communication with Friends and Family: More than three times a week   Frequency of Social Gatherings with Friends and Family: Once a week   Attends Religious Services: More than 4 times per year   Active Member of [employer] or Organizations: No   Attends Music therapist: Never   Marital Status: Married     Review of Systems  see above; denies fever,HA,CP,dyspnea, cough, abd pain, N/V or bleeding; she does have back pain  Vital Signs: BP (!) 170/101   Pulse 87   Temp 98.8 F (37.1 C)   Resp 18   Ht _0  (1.651 m)   Wt 110 lb (49.9 kg)   SpO2 100%   BMI 18.30 kg/m   Physical Exam awake/alert; chest- CTA bilat; heart- nl rate, occ ectopy noted; abd soft,+BS,NT; no LE edema  Imaging: No results found.  Labs:  CBC: Recent Labs    04/28/20 1121 05/27/20 1632 11/06/20 1919 11/09/20 1545  WBC 6.0 8.7 75.6* 82.0*  HGB 10.7* 11.9 10.1* 10.0*  HCT 32.3* 35.3 30.9* 31.4*  PLT 369 412 296 355    COAGS: Recent Labs    11/06/20 1919  INR 1.4*  APTT 36    BMP: Recent Labs    04/28/20 1121 05/27/20 1632 10/05/20 0959 10/21/20 1135 11/06/20 1934  NA 136 141 141 140 140  K 3.8 4.2 4.1 4.1 4.2  CL 102 102 100 102 107  CO2 _1 19* 25  GLUCOSE 110* 79 85 65 79  BUN 24* _2 CALCIUM 8.6* 9.3 8.1* 9.1 8.7*  CREATININE 1.11* 1.04* 1.39* 1.56* 1.37*  GFRNONAA 52*  --   --   --  41*    LIVER FUNCTION TESTS: Recent Labs    10/05/20 0959 10/21/20 1135 11/06/20 1934  BILITOT 0.4 0.3 0.7  AST 39 41* 33  ALT _0 ALKPHOS 196* 194* 186*  PROT 7.9 7.8 8.6*  ALBUMIN 3.9 3.9 3.9    TUMOR MARKERS: No results for input(s): AFPTM, CEA,  CA199, CHROMGRNA in the last 8760 hours.  Assessment and Plan: 75 y.o. female with past medical history of anxiety, depression, arthritis, Barrett's esophagus, chronic neck and back pain, chronic kidney disease, dysphagia, GERD, degenerative disc disease, hypertension, lupus, osteoarthritis.  She now presents with markedly elevated WBC along with weight loss, fatigue, diminished appetite and occasional night sweats.  Flow cytometry has revealed minor myeloblastic population/leukoerythroblastic reaction. She presents today for CT-guided bone marrow biopsy to rule out leukemia.Risks and benefits of procedure was discussed with the patient  including, but not limited to bleeding, infection, damage to adjacent structures or low yield requiring additional tests.  All of the questions were answered and there is agreement to proceed.  Consent signed and in chart.    Thank you for this interesting consult.  I greatly enjoyed meeting Astin Rape and look forward to participating in their care.  A copy of this report was sent to the requesting provider on this date.  Electronically Signed: D. Rowe Robert, PA-C 11/18/2020, 9:23 AM   I spent a total of  20 minutes   in face to face in clinical consultation, greater than 50% of which was counseling/coordinating care for CT guided bone marrow biopsy

## 2020-11-18 NOTE — Discharge Instructions (Addendum)

## 2020-11-18 NOTE — Procedures (Signed)
Interventional Radiology Procedure Note  Procedure: CT guided bone marrow aspiration and biopsy  Complications: None  EBL: < 10 mL  Findings: Aspirate yielded dry tap. Core biopsy performed x 2 of bone marrow in right iliac bone.  Plan: Bedrest supine x 1 hrs  Aaira Oestreicher T. Kathlene Cote, M.D Pager:  [phone]

## 2020-11-19 ENCOUNTER — Inpatient Hospital Stay (HOSPITAL_COMMUNITY): Payer: Medicare Other

## 2020-11-19 ENCOUNTER — Other Ambulatory Visit: Payer: Self-pay

## 2020-11-19 ENCOUNTER — Ambulatory Visit (HOSPITAL_COMMUNITY): Payer: Medicare Other | Admitting: Hematology

## 2020-11-19 DIAGNOSIS — Z87891 Personal history of nicotine dependence: Secondary | ICD-10-CM | POA: Diagnosis not present

## 2020-11-19 DIAGNOSIS — D72829 Elevated white blood cell count, unspecified: Secondary | ICD-10-CM | POA: Diagnosis not present

## 2020-11-19 DIAGNOSIS — M549 Dorsalgia, unspecified: Secondary | ICD-10-CM | POA: Diagnosis not present

## 2020-11-19 DIAGNOSIS — N183 Chronic kidney disease, stage 3 unspecified: Secondary | ICD-10-CM | POA: Diagnosis not present

## 2020-11-19 DIAGNOSIS — G8929 Other chronic pain: Secondary | ICD-10-CM | POA: Diagnosis not present

## 2020-11-19 DIAGNOSIS — R5383 Other fatigue: Secondary | ICD-10-CM | POA: Diagnosis not present

## 2020-11-20 LAB — SURGICAL PATHOLOGY

## 2020-11-24 ENCOUNTER — Ambulatory Visit (HOSPITAL_COMMUNITY): Payer: Medicare Other | Admitting: Hematology

## 2020-11-24 ENCOUNTER — Encounter: Payer: Self-pay | Admitting: Family Medicine

## 2020-11-24 DIAGNOSIS — H26493 Other secondary cataract, bilateral: Secondary | ICD-10-CM | POA: Diagnosis not present

## 2020-11-24 DIAGNOSIS — H524 Presbyopia: Secondary | ICD-10-CM | POA: Diagnosis not present

## 2020-11-24 DIAGNOSIS — H35363 Drusen (degenerative) of macula, bilateral: Secondary | ICD-10-CM | POA: Diagnosis not present

## 2020-11-24 DIAGNOSIS — Z961 Presence of intraocular lens: Secondary | ICD-10-CM | POA: Diagnosis not present

## 2020-11-24 DIAGNOSIS — H43812 Vitreous degeneration, left eye: Secondary | ICD-10-CM | POA: Diagnosis not present

## 2020-11-26 ENCOUNTER — Ambulatory Visit (HOSPITAL_COMMUNITY): Payer: Medicare Other | Admitting: Hematology

## 2020-11-26 ENCOUNTER — Encounter (HOSPITAL_COMMUNITY): Payer: Self-pay | Admitting: Hematology

## 2020-11-27 ENCOUNTER — Other Ambulatory Visit (HOSPITAL_COMMUNITY): Payer: Self-pay

## 2020-11-27 DIAGNOSIS — D72829 Elevated white blood cell count, unspecified: Secondary | ICD-10-CM

## 2020-11-28 NOTE — Progress Notes (Signed)
[place], [place] [address]   CLINIC:  Medical Oncology/Hematology  PCP:  [name], MD [address], [place] / [place] [address]  [phone]  REASON FOR VISIT:  Follow-up for leukocytosis  PRIOR THERAPY: none  CURRENT THERAPY: under work-up  INTERVAL HISTORY:  [name], a 75 y.o. female, returns for routine follow-up for her leukocytosis. Cookie was last seen on 11/09/2020.  Today she reports feeling good. She reports some anxiety. Her appetite has improved. The pain in the right side of her abdomen has improved.   REVIEW OF SYSTEMS:  Review of Systems  Constitutional:  Negative for appetite change and fatigue (70%).  Musculoskeletal:  Positive for arthralgias (5/10).  Psychiatric/Behavioral:  The patient is nervous/anxious.   All other systems reviewed and are negative.  PAST MEDICAL/SURGICAL HISTORY:  Past Medical History:  Diagnosis Date   Acute cholangitis    Allergy    Anemia    Anxiety    Arthritis    Phreesia 03/18/2020   Barrett's esophagus    Cataract    Chronic back pain    Chronic neck pain    CKD (chronic kidney disease) stage 3, GFR 30-59 ml/min ([place]) 10/29/2020   Depression    Genital herpes    GERD (gastroesophageal reflux disease)    H/O degenerative disc disease    History of blood transfusion    Hypertension    Insomnia    Lupus (systemic lupus erythematosus) ([place])    Neuromuscular disorder ([place])    Osteoarthritis    S/P colonoscopy June 2005   normal, no polyps   S/P endoscopy June 2005, Oct 2009   2005: short-segment Barrett's, 2009: short-segment Barrett's   UTI (lower urinary tract infection) 11/2012   Past Surgical History:  Procedure Laterality Date   ABDOMINAL HYSTERECTOMY     BACK SURGERY     BIOPSY N/A 03/20/2014   Procedure: BIOPSY;  Surgeon: Daneil Dolin, MD;  Location: [facility];  Service: Endoscopy;  Laterality: N/A;   BIOPSY  09/14/2015   Procedure:  BIOPSY;  Surgeon: Daneil Dolin, MD;  Location: [facility];  Service: Endoscopy;;  esophageal and gastric   BIOPSY  07/23/2019   Procedure: BIOPSY;  Surgeon: Daneil Dolin, MD;  [facility]: [facility];  Service: Endoscopy;;  esophageal    CARPAL TUNNEL RELEASE Left 2013   cervical disectomy  2002   CESAREAN SECTION N/A    Phreesia 03/18/2020   CHOLECYSTECTOMY     with lysis of adhesions for sbo; "ruptured gallbladder".   COLONOSCOPY  11/09/2011   RMR: Melanosis coli   COLONOSCOPY WITH PROPOFOL N/A 09/14/2015   Dr. Gala Romney: diverticulosis, 16m TA removed. next TCS 09/2020.    COLONOSCOPY WITH PROPOFOL N/A 04/30/2020   Procedure: COLONOSCOPY WITH PROPOFOL;  Surgeon: RDaneil Dolin MD;  [facility];  Service: Endoscopy;  Laterality: N/A;  PM (ASA 3)   DENTAL SURGERY  11/2015   multiple tooth extraction   ESOPHAGOGASTRODUODENOSCOPY  11/29/2007   salmon-colored  tongue   longest stable at  3 cm, distal esophagus as described previously status post biopsy/ Hiatal hernia, otherwise normal stomach D1 and D2   ESOPHAGOGASTRODUODENOSCOPY  01/06/11   short segment Barrett's esophagus s/p bx/Hiatal hernia   ESOPHAGOGASTRODUODENOSCOPY (EGD) WITH PROPOFOL N/A 03/20/2014   RMHW:[id number] esophagus short segment barrett's, bx with no dysplasia. next egd in 03/2017   ESOPHAGOGASTRODUODENOSCOPY (EGD) WITH PROPOFOL N/A 09/14/2015  Dr. Gala Romney: Barrett's without dysplasia, gastritis benign bx, hiatal hernia. next EGD 09/2018.   ESOPHAGOGASTRODUODENOSCOPY (EGD) WITH PROPOFOL N/A 07/23/2019   Procedure: ESOPHAGOGASTRODUODENOSCOPY (EGD) WITH PROPOFOL;  Surgeon: Daneil Dolin, MD;  [facility];  Service: Endoscopy;  Laterality: N/A;  3:00pm   EYE SURGERY N/A    Phreesia 03/18/2020   HERNIA REPAIR Right 07/2010   Dr. Zada Girt   JOINT REPLACEMENT     LAPAROSCOPIC CHOLECYSTECTOMY  2017   at [facility]   POLYPECTOMY  09/14/2015   Procedure: POLYPECTOMY;  Surgeon: Daneil Dolin, MD;   Location: [facility];  Service: Endoscopy;;  ascending colon   right hip replacement  07/2010   went back in sept 2012 to fix   SHOULDER ARTHROSCOPY  2008   left   SPINE SURGERY N/A    Phreesia 03/18/2020   TOTAL HIP REVISION Right 12/17/2012   Procedure: RIGHT TOTAL HIP REVISION;  Surgeon: Mauri Pole, MD;  Location: [facility];  Service: Orthopedics;  Laterality: Right;   WRIST SURGERY Right 2011   open reduction right wrist.    SOCIAL HISTORY:  Social History   Socioeconomic History   Marital status: Married    Spouse name: louis   Number of children: 4   Years of education: 12+   Highest education level: Some college, no degree  Occupational History   Occupation: Press photographer  - retired   Occupation: non profit  Tobacco Use   Smoking status: Former    Packs/day: 0.25    Years: 25.00    Pack years: 6.25    Types: Cigarettes    Quit date: 02/07/2003    Years since quitting: 17.8   Smokeless tobacco: Never   Tobacco comments:    quit in 2004  Vaping Use   Vaping Use: Never used  Substance and Sexual Activity   Alcohol use: No   Drug use: No   Sexual activity: Yes    Birth control/protection: Surgical  Other Topics Concern   Not on file  Social History Narrative   Not on file   Social Determinants of Health   Financial Resource Strain: Low Risk    Difficulty of Paying Living Expenses: Not hard at all  Food Insecurity: No Food Insecurity   Worried About Charity fundraiser in the Last Year: Never true   Arboriculturist in the Last Year: Never true  Transportation Needs: Unmet Transportation Needs   Lack of Transportation (Medical): Yes   Lack of Transportation (Non-Medical): Yes  Physical Activity: Sufficiently Active   Days of Exercise per Week: 7 days   Minutes of Exercise per Session: 30 min  Stress: No Stress Concern Present   Feeling of Stress : Not at all  Social Connections: Moderately Integrated   Frequency of Communication with Friends and  Family: More than three times a week   Frequency of Social Gatherings with Friends and Family: Once a week   Attends Religious Services: More than 4 times per year   Active Member of [employer] or Organizations: No   Attends Archivist Meetings: Never   Marital Status: Married  Human resources officer Violence: Not At Risk   Fear of Current or Ex-Partner: No   Emotionally Abused: No   Physically Abused: No   Sexually Abused: No    FAMILY HISTORY:  Family History  Problem Relation Age of Onset   Hypertension Mother    Stroke Mother    Colon cancer Neg Hx  Anesthesia problems Neg Hx    Hypotension Neg Hx    Malignant hyperthermia Neg Hx    Pseudochol deficiency Neg Hx    Gastric cancer Neg Hx    Esophageal cancer Neg Hx     CURRENT MEDICATIONS:  Current Outpatient Medications  Medication Sig Dispense Refill   acyclovir (ZOVIRAX) 400 MG tablet Take 1 tablet (400 mg total) by mouth 2 (two) times daily. 60 tablet 3   ALPRAZolam (XANAX) 0.5 MG tablet Take 1 tablet (0.5 mg total) by mouth at bedtime. 30 tablet 5   EPINEPHRINE 0.3 mg/0.3 mL IJ SOAJ injection INJECT 0.3 MLS INTO MUSCLE ONCE AS NEEDED (Patient taking differently: Inject 0.3 mg into the muscle as needed for anaphylaxis.) 1 Device 2   gabapentin (NEURONTIN) 300 MG capsule TAKE 1 CAPSULE BY MOUTH ONCE DAILY FOR  CHRONIC  PAIN (Patient taking differently: TAKE 1 CAPSULE BY MOUTH ONCE DAILY FOR  CHRONIC  PAIN) 90 capsule 0   hydrochlorothiazide (HYDRODIURIL) 25 MG tablet Take 1 tablet by mouth once daily 90 tablet 0   losartan (COZAAR) 25 MG tablet Take 1 tablet by mouth once daily for blood pressure 30 tablet 0   Oxycodone HCl 10 MG TABS Take 1 tablet (10 mg total) by mouth 4 (four) times daily. (Patient taking differently: Take 10 mg by mouth 4 (four) times daily as needed (pain).) 56 tablet 0   pantoprazole (PROTONIX) 40 MG tablet TAKE 1 TABLET BY MOUTH ONCE DAILY BEFORE BREAKFAST 90 tablet 3   potassium chloride (KLOR-CON)  10 MEQ tablet potassium chloride ER 10 mEq tablet,extended release     Probiotic Product (EQL DAILY PROBIOTIC PO) Take 1 capsule by mouth daily.     temazepam (RESTORIL) 30 MG capsule Take 1 capsule (30 mg total) by mouth at bedtime. 30 capsule 0   tiZANidine (ZANAFLEX) 4 MG tablet Take 8 mg by mouth 3 (three) times daily as needed (spasms). 180 tablet 0   topiramate (TOPAMAX) 50 MG tablet topiramate 50 mg tablet     valsartan (DIOVAN) 80 MG tablet Take by mouth.     No current facility-administered medications for this visit.    ALLERGIES:  Allergies  Allergen Reactions   Bee Venom Swelling and Hives   Acetaminophen     itches   Amlodipine     Hair loss   Tyloxapol    Other Itching, Rash and Swelling   Oxycodone-Acetaminophen Rash    Pt states, "this gives her a rash, but at home she takes oxycodone for pain relief" Pt states, "this gives her a rash, but at home she takes oxycodone for pain relief"    PHYSICAL EXAM:  Performance status (ECOG): 1 - Symptomatic but completely ambulatory  There were no vitals filed for this visit. Wt Readings from Last 3 Encounters:  11/18/20 110 lb (49.9 kg)  11/09/20 116 lb 9.6 oz (52.9 kg)  11/06/20 110 lb (49.9 kg)   Physical Exam Vitals reviewed.  Constitutional:      Appearance: Normal appearance.  Cardiovascular:     Rate and Rhythm: Normal rate and regular rhythm.     Pulses: Normal pulses.     Heart sounds: Normal heart sounds.  Pulmonary:     Effort: Pulmonary effort is normal.     Breath sounds: Normal breath sounds.  Abdominal:     Palpations: Abdomen is soft. There is no hepatomegaly, splenomegaly or mass.     Tenderness: There is no abdominal tenderness.  Lymphadenopathy:  Upper Body:     Right upper body: No axillary or pectoral adenopathy.     Left upper body: No axillary or pectoral adenopathy.  Neurological:     General: No focal deficit present.     Mental Status: She is alert and oriented to person, place,  and time.  Psychiatric:        Mood and Affect: Mood normal.        Behavior: Behavior normal.    LABORATORY DATA:  I have reviewed the labs as listed.  CBC Latest Ref Rng & Units 11/18/2020 11/09/2020 11/06/2020  WBC 4.0 - 10.5 K/uL 81.2(HH) 82.0(HH) 75.6(HH)  Hemoglobin 12.0 - 15.0 g/dL 9.3(L) 10.0(L) 10.1(L)  Hematocrit 36.0 - 46.0 % 27.9(L) 31.4(L) 30.9(L)  Platelets 150 - 400 K/uL 237 355 296   CMP Latest Ref Rng & Units 11/06/2020 10/21/2020 10/05/2020  Glucose 70 - 99 mg/dL 79 65 85  BUN 8 - 23 mg/dL 17 22 17   Creatinine 0.44 - 1.00 mg/dL 1.37(H) 1.56(H) 1.39(H)  Sodium 135 - 145 mmol/L 140 140 141  Potassium 3.5 - 5.1 mmol/L 4.2 4.1 4.1  Chloride 98 - 111 mmol/L 107 102 100  CO2 22 - 32 mmol/L 25 19(L) 21  Calcium 8.9 - 10.3 mg/dL 8.7(L) 9.1 8.1(L)  Total Protein 6.5 - 8.1 g/dL 8.6(H) 7.8 7.9  Total Bilirubin 0.3 - 1.2 mg/dL 0.7 0.3 0.4  Alkaline Phos 38 - 126 U/L 186(H) 194(H) 196(H)  AST 15 - 41 U/L 33 41(H) 39  ALT 0 - 44 U/L 15 14 15       Component Value Date/Time   RBC 2.89 (L) 11/18/2020 0955   MCV 96.5 11/18/2020 0955   MCV 91 05/27/2020 1632   MCH 32.2 11/18/2020 0955   MCHC 33.3 11/18/2020 0955   RDW 19.2 (H) 11/18/2020 0955   RDW 14.1 05/27/2020 1632   LYMPHSABS 4.9 (H) 11/18/2020 0955   LYMPHSABS 1.7 05/27/2020 1632   MONOABS 13.8 (H) 11/18/2020 0955   EOSABS 0.8 (H) 11/18/2020 0955   EOSABS 0.2 05/27/2020 1632   BASOSABS 0.0 11/18/2020 0955   BASOSABS 0.0 05/27/2020 1632    DIAGNOSTIC IMAGING:  I have independently reviewed the scans and discussed with the patient. CT Biopsy  Result Date: 11/18/2020 CLINICAL DATA:  Leukocytosis with left shift and leukoerythroblastic reaction. Possible leukemia. Need for bone marrow biopsy. EXAM: CT GUIDED BONE MARROW ASPIRATION AND BIOPSY ANESTHESIA/SEDATION: Moderate (conscious) sedation was employed during this procedure. A total of Versed 2.5 mg and Fentanyl 100 mcg was administered intravenously by radiology  nursing under my direct supervision. Moderate Sedation Time: 19 minutes. The patient's level of consciousness and vital signs were monitored continuously by radiology nursing throughout the procedure under my direct supervision. PROCEDURE: The procedure risks, benefits, and alternatives were explained to the patient. Questions regarding the procedure were encouraged and answered. The patient understands and consents to the procedure. A time out was performed prior to initiating the procedure. The right gluteal region was prepped with chlorhexidine. Sterile gown and sterile gloves were used for the procedure. Local anesthesia was provided with 1% Lidocaine. Under CT guidance, an 11 gauge On Control bone cutting needle was advanced from a posterior approach into the right iliac bone. Needle positioning was confirmed with CT. Initial attempt at obtaining aspirate samples of bone marrow was performed. Core biopsy was performed via the On Control drill needle. Two separate core biopsy samples were obtained. COMPLICATIONS: None FINDINGS: Aspiration yielded a dry tap with no liquid bone  marrow. For this reason, 2 separate core biopsy samples were obtained in slightly different positions of the right iliac bone. IMPRESSION: CT guided bone marrow biopsy of right posterior iliac bone. The aspirate attempt yielded a dry tap. Two separate core biopsy samples were obtained. Electronically Signed   By: Aletta Edouard M.D.   On: 11/18/2020 13:52   CT BONE MARROW BIOPSY & ASPIRATION  Result Date: 11/18/2020 CLINICAL DATA:  Leukocytosis with left shift and leukoerythroblastic reaction. Possible leukemia. Need for bone marrow biopsy. EXAM: CT GUIDED BONE MARROW ASPIRATION AND BIOPSY ANESTHESIA/SEDATION: Moderate (conscious) sedation was employed during this procedure. A total of Versed 2.5 mg and Fentanyl 100 mcg was administered intravenously by radiology nursing under my direct supervision. Moderate Sedation Time: 19 minutes.  The patient's level of consciousness and vital signs were monitored continuously by radiology nursing throughout the procedure under my direct supervision. PROCEDURE: The procedure risks, benefits, and alternatives were explained to the patient. Questions regarding the procedure were encouraged and answered. The patient understands and consents to the procedure. A time out was performed prior to initiating the procedure. The right gluteal region was prepped with chlorhexidine. Sterile gown and sterile gloves were used for the procedure. Local anesthesia was provided with 1% Lidocaine. Under CT guidance, an 11 gauge On Control bone cutting needle was advanced from a posterior approach into the right iliac bone. Needle positioning was confirmed with CT. Initial attempt at obtaining aspirate samples of bone marrow was performed. Core biopsy was performed via the On Control drill needle. Two separate core biopsy samples were obtained. COMPLICATIONS: None FINDINGS: Aspiration yielded a dry tap with no liquid bone marrow. For this reason, 2 separate core biopsy samples were obtained in slightly different positions of the right iliac bone. IMPRESSION: CT guided bone marrow biopsy of right posterior iliac bone. The aspirate attempt yielded a dry tap. Two separate core biopsy samples were obtained. Electronically Signed   By: Aletta Edouard M.D.   On: 11/18/2020 13:52   US Abdomen Limited RUQ (LIVER/GB)  Result Date: 11/18/2020 CLINICAL DATA:  Right upper quadrant tenderness x1 year. History of prior cholecystectomy. EXAM: ULTRASOUND ABDOMEN LIMITED RIGHT UPPER QUADRANT COMPARISON:  None. FINDINGS: Gallbladder: The gallbladder is surgically absent. Common bile duct: Diameter: 5.1 mm Liver: No focal lesion identified. Within normal limits in parenchymal echogenicity. Portal vein is patent on color Doppler imaging with normal direction of blood flow towards the liver. Other: None. IMPRESSION: 1. Evidence of prior  cholecystectomy. 2. Otherwise, unremarkable right upper quadrant ultrasound. Electronically Signed   By: Virgina Norfolk M.D.   On: 11/18/2020 19:38     ASSESSMENT:  Leukocytosis with left shift: - CBC on 11/06/2020 with white count 75.6, differential 59% neutrophils, 12% monocytes, 4% lymphocytes, 1 percentage of eosinophils and basophils, 6% band neutrophils, 12% myelocytes, 1% promyelocytes, 29% blasts. - Pathologist review of blood smear reported as leukoerythroblastic reaction. - 25 pound weight loss in the last 6 months, unintentional.  Decreased appetite.  Reports fatigue for the last few months.  Reports night sweats x3 in the last 1 month.  2.  Social/family history: - She lives at home and is able to do all her ADLs and IADLs although she is getting tired lately.  She reports quitting smoking 6 months ago and smoked 1 pack/week for 43 years. - She believes that her mother had some kind of leukemia.  No other malignancies.   PLAN:  Leukocytosis: - I have reviewed bone marrow biopsy from 11/18/2020. -  Bone marrow aspirate showed hypercellular bone marrow with granulocytic proliferation.  Differential diagnosis includes myeloproliferative disorder versus CMML. - BCR/ABL result pending. - We will order JAK2 [id number] with reflex testing. - We will also order myeloid NGS panel. - CBC on 11/18/2020 showed white count 81.2, hemoglobin 9.3 and platelet count 237.  She has anemia from bone marrow infiltration. - Discussed with her children on the phone.  RTC 2 weeks for follow-up.  2.  Right upper quadrant tenderness: - She has severe tenderness in the right upper quadrant. - I reviewed ultrasound of the right upper quadrant which was benign.  3.  CKD: - Creatinine baseline between 1.3-1.5. - Continue follow-up with Dr. Theador Hawthorne.  4.  Hyperuricemia: - Uric acid was 8.4 on 11/09/2020. - We will start her on allopurinol 300 mg daily.  Orders placed this encounter:  No orders of the  defined types were placed in this encounter.    Derek Jack, MD [place] [phone]   I, Thana Ates, am acting as a scribe for Dr. Derek Jack.  I, Derek Jack MD, have reviewed the above documentation for accuracy and completeness, and I agree with the above.

## 2020-11-30 ENCOUNTER — Other Ambulatory Visit: Payer: Self-pay

## 2020-11-30 ENCOUNTER — Inpatient Hospital Stay (HOSPITAL_BASED_OUTPATIENT_CLINIC_OR_DEPARTMENT_OTHER): Payer: Medicare Other | Admitting: Hematology

## 2020-11-30 ENCOUNTER — Inpatient Hospital Stay (HOSPITAL_COMMUNITY): Payer: Medicare Other

## 2020-11-30 VITALS — BP 92/62 | HR 66 | Temp 97.0°F | Resp 18 | Wt 116.9 lb

## 2020-11-30 DIAGNOSIS — D72829 Elevated white blood cell count, unspecified: Secondary | ICD-10-CM

## 2020-11-30 DIAGNOSIS — N183 Chronic kidney disease, stage 3 unspecified: Secondary | ICD-10-CM | POA: Diagnosis not present

## 2020-11-30 DIAGNOSIS — M549 Dorsalgia, unspecified: Secondary | ICD-10-CM | POA: Diagnosis not present

## 2020-11-30 DIAGNOSIS — Z87891 Personal history of nicotine dependence: Secondary | ICD-10-CM | POA: Diagnosis not present

## 2020-11-30 DIAGNOSIS — R10811 Right upper quadrant abdominal tenderness: Secondary | ICD-10-CM

## 2020-11-30 DIAGNOSIS — R5383 Other fatigue: Secondary | ICD-10-CM | POA: Diagnosis not present

## 2020-11-30 DIAGNOSIS — G8929 Other chronic pain: Secondary | ICD-10-CM | POA: Diagnosis not present

## 2020-11-30 MED ORDER — ALLOPURINOL 300 MG PO TABS: 300.0000 mg | ORAL_TABLET | Freq: Every day | ORAL | 3 refills | Status: DC

## 2020-11-30 NOTE — Patient Instructions (Signed)
Renee Waters at [facility] Discharge Instructions  You were seen and examined today by [name].  We will check additional lab work today to complete your diagnosis.  Your uric acid is elevated - start Allopurinol as prescribed.   Return as scheduled in about 2 weeks for office visit to review labs.    Thank you for choosing [place] at [facility] to provide your oncology and hematology care.  To afford each patient quality time with our provider, please arrive at least 15 minutes before your scheduled appointment time.   If you have a lab appointment with the [facility] please come in thru the Main Entrance and check in at the main information desk.  You need to re-schedule your appointment should you arrive 10 or more minutes late.  We strive to give you quality time with our providers, and arriving late affects you and other patients whose appointments are after yours.  Also, if you no show three or more times for appointments you may be dismissed from the clinic at the providers discretion.     Again, thank you for choosing [facility].  Our hope is that these requests will decrease the amount of time that you wait before being seen by our physicians.       _____________________________________________________________  Should you have questions after your visit to [facility], please contact our office at [phone] and follow the prompts.  Our office hours are 8:00 a.m. and 4:30 p.m. Monday - Friday.  Please note that voicemails left after 4:00 p.m. may not be returned until the following business day.  We are closed weekends and major holidays.  You do have access to a nurse 24-7, just call the main number to the clinic [phone] and do not press any options, hold on the line and a nurse will answer the phone.    For prescription refill requests, have your pharmacy contact our office and allow 72  hours.    Due to Covid, you will need to wear a mask upon entering the hospital. If you do not have a mask, a mask will be given to you at the Main Entrance upon arrival. For doctor visits, patients may have 1 support person age 75 or older with them. For treatment visits, patients can not have anyone with them due to social distancing guidelines and our immunocompromised population.

## 2020-12-01 ENCOUNTER — Ambulatory Visit (INDEPENDENT_AMBULATORY_CARE_PROVIDER_SITE_OTHER): Payer: Medicare Other | Admitting: *Deleted

## 2020-12-01 ENCOUNTER — Telehealth: Payer: Self-pay | Admitting: *Deleted

## 2020-12-01 DIAGNOSIS — M159 Polyosteoarthritis, unspecified: Secondary | ICD-10-CM

## 2020-12-01 DIAGNOSIS — I10 Essential (primary) hypertension: Secondary | ICD-10-CM

## 2020-12-01 LAB — BCR-ABL1, CML/ALL, PCR, QUANT: Interpretation (BCRAL):: NEGATIVE

## 2020-12-01 NOTE — Patient Instructions (Signed)
Visit Information  PATIENT GOALS:  Goals Addressed             This Visit's Progress    Manage Chronic Pain       Timeframe:  Long-Range Goal Priority:  High Start Date:          10/13/2020                   Expected End Date:     04/12/2021                  Follow Up Date 01/07/2021   - call for medicine refill 2 or 3 days before it runs out - take pain medication as prescribed - plan exercise or activity when pain is best controlled - prioritize tasks for the day- ask for help as needed - track what makes the pain worse and what makes it better - use ice or heat for pain relief  - expect a call from social worker to assist with depression/ anxiety management - try to keep stress to a minimum, get outside in the sunshine everyday - wear a mask as needed and practice good handwashing   Why is this important?   Day-to-day life can be hard when you have chronic pain.  Pain medicine is just one piece of the treatment puzzle.  You can try these action steps to help you manage your pain.    Notes:      Track and Manage My Blood Pressure-Hypertension       Timeframe:  Long-Range Goal Priority:  Medium Start Date:     10/13/2020                        Expected End Date:       04/12/2020                Follow Up Date 01/07/2021   - continue checking blood pressure daily and keep a log - follow a low sodium diet - avoid salty snacks and fast food- read labels for sodium content - take all medications as prescribed   Why is this important?   You won't feel high blood pressure, but it can still hurt your blood vessels.  High blood pressure can cause heart or kidney problems. It can also cause a stroke.  Making lifestyle changes like losing a little weight or eating less salt will help.  Checking your blood pressure at home and at different times of the day can help to control blood pressure.  If the doctor prescribes medicine remember to take it the way the doctor ordered.  Call the  office if you cannot afford the medicine or if there are questions about it.     Notes:         Patient verbalizes understanding of instructions provided today and agrees to view in [place].   Telephone follow up appointment with care management team member scheduled for:  01/07/2021  Jacqlyn Larsen [facility], BSN RN Case Manager [facility] [phone]

## 2020-12-01 NOTE — Chronic Care Management (AMB) (Signed)
Chronic Care Management   CCM RN Visit Note  12/01/2020 Name: Renee Waters MRN: [id number] DOB: [birth year]  Subjective: Renee Waters is a 75 y.o. year old female who is a primary care patient of [name], MD. The care management team was consulted for assistance with disease management and care coordination needs.    Engaged with patient by telephone for follow up visit in response to provider referral for case management and/or care coordination services.   Consent to Services:  The patient was given information about Chronic Care Management services, agreed to services, and gave verbal consent prior to initiation of services.  Please see initial visit note for detailed documentation.   Patient agreed to services and verbal consent obtained.   Assessment: Review of patient past medical history, allergies, medications, health status, including review of consultants reports, laboratory and other test data, was performed as part of comprehensive evaluation and provision of chronic care management services.   SDOH (Social Determinants of Health) assessments and interventions performed:    CCM Care Plan  Allergies  Allergen Reactions   Bee Venom Swelling and Hives   Acetaminophen     itches   Amlodipine     Hair loss   Tyloxapol    Other Itching, Rash and Swelling   Oxycodone-Acetaminophen Rash    Pt states, "this gives her a rash, but at home she takes oxycodone for pain relief" Pt states, "this gives her a rash, but at home she takes oxycodone for pain relief"    Outpatient Encounter Medications as of 12/01/2020  Medication Sig Note   allopurinol (ZYLOPRIM) 300 MG tablet Take 1 tablet (300 mg total) by mouth daily.    ALPRAZolam (XANAX) 0.5 MG tablet Take 1 tablet (0.5 mg total) by mouth at bedtime.    gabapentin (NEURONTIN) 300 MG capsule TAKE 1 CAPSULE BY MOUTH ONCE DAILY FOR  CHRONIC  PAIN (Patient taking differently: TAKE 1 CAPSULE BY MOUTH ONCE DAILY FOR   CHRONIC  PAIN) 10/28/2020: Pt reports taking med as needed when she has herpes breakout.    Oxycodone HCl 10 MG TABS Take 1 tablet (10 mg total) by mouth 4 (four) times daily. (Patient taking differently: Take 10 mg by mouth 4 (four) times daily as needed (pain).)    pantoprazole (PROTONIX) 40 MG tablet TAKE 1 TABLET BY MOUTH ONCE DAILY BEFORE BREAKFAST    Probiotic Product (EQL DAILY PROBIOTIC PO) Take 1 capsule by mouth daily.    temazepam (RESTORIL) 30 MG capsule Take 1 capsule (30 mg total) by mouth at bedtime.    tiZANidine (ZANAFLEX) 4 MG tablet Take 8 mg by mouth 3 (three) times daily as needed (spasms).    acyclovir (ZOVIRAX) 400 MG tablet Take 1 tablet (400 mg total) by mouth 2 (two) times daily. (Patient not taking: Reported on 12/01/2020) 10/28/2020: Pt reports taking med as needed.    EPINEPHRINE 0.3 mg/0.3 mL IJ SOAJ injection INJECT 0.3 MLS INTO MUSCLE ONCE AS NEEDED (Patient not taking: No sig reported)    hydrochlorothiazide (HYDRODIURIL) 25 MG tablet Take 1 tablet by mouth once daily (Patient not taking: No sig reported)    losartan (COZAAR) 25 MG tablet Take 1 tablet by mouth once daily for blood pressure (Patient not taking: No sig reported)    potassium chloride (KLOR-CON) 10 MEQ tablet potassium chloride ER 10 mEq tablet,extended release (Patient not taking: Reported on 12/01/2020)    topiramate (TOPAMAX) 50 MG tablet topiramate 50 mg tablet (Patient not taking: Reported on  12/01/2020)    valsartan (DIOVAN) 80 MG tablet Take by mouth. (Patient not taking: No sig reported)    No facility-administered encounter medications on file as of 12/01/2020.    Patient Active Problem List   Diagnosis Date Noted   Leukocytosis 11/09/2020   CKD (chronic kidney disease) stage 3, GFR 30-59 ml/min (HCC) 10/29/2020   Dermatomycosis 08/02/2020   HSV-2 (herpes simplex virus 2) infection 08/02/2020   Upper respiratory tract infection 07/09/2020   Rib pain on left side 03/21/2020   Generalized  pruritus 03/21/2020   Mildly underweight adult 09/19/2019   MDD (major depressive disorder), recurrent, in full remission ([place]) 03/27/2019   Educated about COVID-19 virus infection 06/24/2018   MDD (major depressive disorder), recurrent episode, moderate ([place]) 10/28/2016   Thoracic spine pain 08/31/2016   Anemia, normocytic normochromic 05/27/2015   IDA (iron deficiency anemia) 03/02/2015   Unsteady gait 02/25/2015   Mucosal abnormality of esophagus    Systemic lupus ([place]) 10/31/2012   Vitamin D deficiency 03/16/2012   Generalized pain 03/15/2012   Constipation 10/06/2011   Hip pain 06/29/2011   Barrett's esophagus 12/13/2010   Western blot positive HSV2 06/22/2007   Depression 06/22/2007   Essential hypertension 06/22/2007   GERD (gastroesophageal reflux disease) 06/22/2007   DJD (degenerative joint disease) 06/22/2007   NECK PAIN, CHRONIC 06/22/2007   Chronic midline low back pain with sciatica 06/22/2007   Insomnia secondary to depression with anxiety 06/22/2007    Conditions to be addressed/monitored:HTN, Chronic Pain  Care Plan : Hypertension (Adult)  Updates made by Kassie Mends, RN since 12/01/2020 12:00 AM     Problem: Hypertension (Hypertension)   Priority: Medium     Long-Range Goal: Hypertension Monitored   Start Date: 10/13/2020  Expected End Date: 04/12/2021  This Visit's Progress: On track  Recent Progress: On track  Priority: Medium  Note:   Objective:  Last practice recorded BP readings:  BP Readings from Last 3 Encounters:  09/02/20 (!) 146/74  07/14/20 119/64  05/27/20 (!) 172/78   Most recent eGFR/CrCl:  Lab Results  Component Value Date   EGFR 40 (L) 10/05/2020    No components found for: CRCL Current Barriers:  Knowledge Deficits related to basic understanding of hypertension pathophysiology and self care management-  patient reports she lives with her spouse, has other relatives she can call on if needed, does check blood pressure daily  with readings "mostly normal" with most recent 96/68.  Reports having all medications and taking as prescribed, uses cane at home, has walker if needed.  Pt reports she had recent ED visit due to leukocytosis and she is following up with oncologist for leukemia, pt reports she did receive the flu vaccine, is trying to eat healthy as possible. Case Manager Clinical Goal(s):  patient will verbalize understanding of plan for hypertension management patient will demonstrate improved adherence to prescribed treatment plan for hypertension as evidenced by taking all medications as prescribed, monitoring and recording blood pressure as directed, adhering to low sodium/DASH diet Interventions:  Collaboration with [name], MD regarding development and update of comprehensive plan of care as evidenced by provider attestation and co-signature Inter-disciplinary care team collaboration (see longitudinal plan of care) Evaluation of current treatment plan related to hypertension self management and patient's adherence to plan as established by provider. Reinforced education to patient re: stroke prevention, s/s of heart attack and stroke, DASH diet, complications of uncontrolled blood pressure Reviewed medications with patient and discussed importance of compliance Discussed plans with  patient for ongoing care management follow up and provided patient with direct contact information for care management team Reinforced with patient, providing education and rationale, to monitor blood pressure daily and record, calling PCP for findings outside established parameters.  Reviewed scheduled/upcoming provider appointments including:  primary care provider 12/15,  Annual  Wellness Visit 11/16, [name] 11/7 Reviewed importance of good handwashing, avoiding sick persons, wearing mask as needed Self-Care Activities:  Attends all scheduled provider appointments Calls provider office for new concerns,  questions, or BP outside discussed parameters Checks BP and records as discussed Follows a low sodium diet/DASH diet Patient Goals: - continue checking blood pressure daily and keep a log - follow a low sodium diet - avoid salty snacks and fast food- read labels for sodium content - take all medications as prescribed Follow Up Plan: Telephone follow up appointment with care management team member scheduled for:   01/07/2021    Care Plan : Chronic Pain (Adult)  Updates made by Kassie Mends, RN since 12/01/2020 12:00 AM     Problem: Chronic Pain Management (Chronic Pain)   Priority: High     Long-Range Goal: Chronic Pain Managed   Start Date: 10/13/2020  Expected End Date: 04/12/2021  This Visit's Progress: Not on track  Recent Progress: On track  Priority: High  Note:   Current Barriers:  Knowledge Deficits related to self-health management of acute or chronic pain- pt has unmanaged chronic pain (neck, lower back and " all over" DJD). Pt reports she has pain medication which does help.  Patient reports she has "a lot of anxiety and depression can vary" with PHQ2=1 today -although pt reports she is tearful today.  Patient reports has psych doctor left and she has not found a new doctor, per notes- primary care physician was working on referral for psych, pt reports she would like LCSW to work with her on management of anxiety, stress, depression, pt reports she has not received a call from LCSW and would like RN care manager to follow up. Pt has lost weight, her usual weight is 130 pounds and she reports was down to 105 pounds and is now 116 pounds, reports "doctor told me not to keep drinking the ensure", reports spouse trying to cook healthy meals, pt occasionally gets out in yard, she intends to check on going to a gym for water aerobics.  Patient reports she has chronic insomnia and is on medication and this helps. Chronic Disease Management support and education needs related to chronic  pain Care Coordination needs related to anxiety, stress, depression in a patient with chronic pain Chronic Disease Management support and education needs related to pain management, management of stress and anxiety. Clinical Goal(s):  patient will verbalize understanding of plan for pain management.  patient will attend all scheduled medical appointments: 9/21 primary care provider   11/16 Annual Wellness Visit  patient will demonstrate use of different relaxation  skills and/or diversional activities to assist with pain reduction (distraction, imagery, relaxation, massage, acupressure, TENS, heat, and cold application.  patient will report pain at a level less than 3 to 4 on a 10-10 rating scale., patient will use pharmacological and nonpharmacological pain relief strategies as prescribed.  patient will verbalize acceptable level of pain relief and ability to engage in desired activities Interventions:  Collaboration with [name], MD regarding development and update of comprehensive plan of care as evidenced by provider attestation and co-signature Discussed plans with patient for ongoing care management follow  up and provided patient with direct contact information for care management team Evaluation of current treatment plan related to chronic pain and patient's adherence to plan as established by provider. Provided education to patient re: pain management, pain assessment completed Reviewed medications with patient and discussed importance of taking as prescribed Social Work referral previously placed for management of stress, anxiety, depression- Consulting civil engineer sent in basket to LCSW for follow up Reinforced plans with patient for ongoing care management follow up and provided patient with direct contact information for care management team Reinforced importance of keeping stress to a minimum, asking for help as needed, getting outside in the sunshine daily, try to walk Reinforced  with pt to talk with primary care provider about any concerns such as seeing a nutritionist  Encouraged healthy diet (fruits, vegetables, lean meats, adequate fluids preferably water) with 3 meals daily and snacks Patient Goals/Self Care Activities:  - call for medicine refill 2 or 3 days before it runs out - take pain medication as prescribed - plan exercise or activity when pain is best controlled - prioritize tasks for the day- ask for help as needed - track what makes the pain worse and what makes it better - use ice or heat for pain relief  - expect a call from social worker to assist with depression/ anxiety management - try to keep stress to a minimum, get outside in the sunshine everyday - wear a mask as needed and practice good handwashing - try to keep stress to a minimum, get outside in the sunshine everyday Will self-administer medications as prescribed Will attend all scheduled provider appointments Will call pharmacy for medication refills 7 days prior to needed refill date Patient will calls provider office for new concerns or questions Follow Up Plan: Telephone follow up appointment with care management team member scheduled for:  01/07/2021       Plan:Telephone follow up appointment with care management team member scheduled for:  01/07/2021  Jacqlyn Larsen [facility], BSN RN Case Manager [facility] [phone]

## 2020-12-01 NOTE — Chronic Care Management (AMB) (Signed)
  Care Management   Note  12/01/2020 Name: Renee Waters MRN: [id number] DOB: July 18, 1945  Renee Waters is a 75 y.o. year old female who is a primary care patient of [name], MD and is actively engaged with the care management team. I reached out to [name] by phone today to assist with scheduling an initial visit with the Licensed Clinical Social Worker  Follow up plan: Unsuccessful telephone outreach attempt made. A HIPAA compliant phone message was left for the patient providing contact information and requesting a return call.  The care management team will reach out to the patient again over the next 7 days.  If patient returns call to provider office, please advise to call [place] [facility] at [phone].  [facility]  Direct Dial: [phone]

## 2020-12-02 DIAGNOSIS — M47812 Spondylosis without myelopathy or radiculopathy, cervical region: Secondary | ICD-10-CM | POA: Diagnosis not present

## 2020-12-02 DIAGNOSIS — M47816 Spondylosis without myelopathy or radiculopathy, lumbar region: Secondary | ICD-10-CM | POA: Diagnosis not present

## 2020-12-02 DIAGNOSIS — Z79891 Long term (current) use of opiate analgesic: Secondary | ICD-10-CM | POA: Diagnosis not present

## 2020-12-02 DIAGNOSIS — G894 Chronic pain syndrome: Secondary | ICD-10-CM | POA: Diagnosis not present

## 2020-12-04 DIAGNOSIS — R809 Proteinuria, unspecified: Secondary | ICD-10-CM | POA: Diagnosis not present

## 2020-12-04 DIAGNOSIS — E1122 Type 2 diabetes mellitus with diabetic chronic kidney disease: Secondary | ICD-10-CM | POA: Diagnosis not present

## 2020-12-04 DIAGNOSIS — N189 Chronic kidney disease, unspecified: Secondary | ICD-10-CM | POA: Diagnosis not present

## 2020-12-04 DIAGNOSIS — E790 Hyperuricemia without signs of inflammatory arthritis and tophaceous disease: Secondary | ICD-10-CM | POA: Diagnosis not present

## 2020-12-04 DIAGNOSIS — D841 Defects in the complement system: Secondary | ICD-10-CM | POA: Diagnosis not present

## 2020-12-04 DIAGNOSIS — N170 Acute kidney failure with tubular necrosis: Secondary | ICD-10-CM | POA: Diagnosis not present

## 2020-12-04 DIAGNOSIS — E1129 Type 2 diabetes mellitus with other diabetic kidney complication: Secondary | ICD-10-CM | POA: Diagnosis not present

## 2020-12-04 DIAGNOSIS — D638 Anemia in other chronic diseases classified elsewhere: Secondary | ICD-10-CM | POA: Diagnosis not present

## 2020-12-07 DIAGNOSIS — I10 Essential (primary) hypertension: Secondary | ICD-10-CM

## 2020-12-07 DIAGNOSIS — M159 Polyosteoarthritis, unspecified: Secondary | ICD-10-CM | POA: Diagnosis not present

## 2020-12-08 NOTE — Chronic Care Management (AMB) (Signed)
  Care Management   Note  12/08/2020 Name: Renee Waters MRN: [id number] DOB: 12-28-1945  Renee Waters is a 75 y.o. year old female who is a primary care patient of [name], MD and is actively engaged with the care management team. I reached out to Renee Waters by phone today to assist with scheduling an initial visit with the Licensed Clinical Social Worker  Follow up plan: Telephone appointment with care management team member scheduled for:12/17/20  [place]  Direct Dial: [phone]

## 2020-12-09 ENCOUNTER — Other Ambulatory Visit (HOSPITAL_COMMUNITY): Payer: Self-pay

## 2020-12-09 MED ORDER — DICYCLOMINE HCL 10 MG PO CAPS: 10.0000 mg | ORAL_CAPSULE | Freq: Two times a day (BID) | ORAL | 0 refills | Status: DC | PRN

## 2020-12-09 NOTE — Telephone Encounter (Signed)
Patient called today reporting abdominal cramping pain. She denies nausea and diarrhea. Patient reports last BM yesterday following laxative use. Patient reports that pain has been unrelieved by Gas-X also. [name] made aware and order received for Bentyl 10mg  BID PRN. Patient aware and agreeable.

## 2020-12-10 LAB — JAK2 V617F, W REFLEX TO CALR/E12/MPL

## 2020-12-11 ENCOUNTER — Telehealth: Payer: Self-pay | Admitting: Family Medicine

## 2020-12-11 NOTE — Telephone Encounter (Signed)
Please advise 

## 2020-12-11 NOTE — Telephone Encounter (Signed)
Pt is calling , she states that Dr Raliegh Ip gave her Dicyclomine and the pharmacy said that is not a good pill for her to take, and to call her PCP.

## 2020-12-12 NOTE — Progress Notes (Signed)
[place], [place] [address]   CLINIC:  Medical Oncology/Hematology  PCP:  [name], MD [address] [place] / [place] [address]  [phone]  REASON FOR VISIT:  Follow-up for leukocytosis  PRIOR THERAPY: none  CURRENT THERAPY: under work-up  INTERVAL HISTORY:  [name], a 75 y.o. female, returns for routine follow-up for her leukocytosis. Renee Waters was last seen on 11/09/2020.  Today Renee Waters reports feeling good. Renee Waters reports losing at least 17 pounds over the past 6 months. Renee Waters continues to have occasional night sweats; about 2 nights a week. Her appetite is good.   REVIEW OF SYSTEMS:  Review of Systems  Constitutional:  Negative for appetite change and fatigue (60%).  Respiratory:  Positive for cough.   Gastrointestinal:  Positive for vomiting.  Skin:  Positive for itching (all over).  Neurological:  Positive for dizziness.  All other systems reviewed and are negative.  PAST MEDICAL/SURGICAL HISTORY:  Past Medical History:  Diagnosis Date   Acute cholangitis    Allergy    Anemia    Anxiety    Arthritis    Phreesia 03/18/2020   Barrett's esophagus    Cataract    Chronic back pain    Chronic neck pain    CKD (chronic kidney disease) stage 3, GFR 30-59 ml/min ([facility]) 10/29/2020   Depression    Genital herpes    GERD (gastroesophageal reflux disease)    H/O degenerative disc disease    History of blood transfusion    Hypertension    Insomnia    Lupus (systemic lupus erythematosus) ([facility])    Neuromuscular disorder ([place])    Osteoarthritis    S/P colonoscopy June 2005   normal, no polyps   S/P endoscopy June 2005, Oct 2009   2005: short-segment Barrett's, 2009: short-segment Barrett's   UTI (lower urinary tract infection) 11/2012   Past Surgical History:  Procedure Laterality Date   ABDOMINAL HYSTERECTOMY     BACK SURGERY     BIOPSY N/A 03/20/2014   Procedure: BIOPSY;  Surgeon: Daneil Dolin, MD;   Location: [facility];  Service: Endoscopy;  Laterality: N/A;   BIOPSY  09/14/2015   Procedure: BIOPSY;  Surgeon: Daneil Dolin, MD;  Location: [facility];  Service: Endoscopy;;  esophageal and gastric   BIOPSY  07/23/2019   Procedure: BIOPSY;  Surgeon: Daneil Dolin, MD;  [facility]: [facility];  Service: Endoscopy;;  esophageal    CARPAL TUNNEL RELEASE Left 2013   cervical disectomy  2002   CESAREAN SECTION N/A    Phreesia 03/18/2020   CHOLECYSTECTOMY     with lysis of adhesions for sbo; "ruptured gallbladder".   COLONOSCOPY  11/09/2011   RMR: Melanosis coli   COLONOSCOPY WITH PROPOFOL N/A 09/14/2015   Dr. Gala Romney: diverticulosis, 52m TA removed. next TCS 09/2020.    COLONOSCOPY WITH PROPOFOL N/A 04/30/2020   Procedure: COLONOSCOPY WITH PROPOFOL;  Surgeon: RDaneil Dolin MD;  [facility];  Service: Endoscopy;  Laterality: N/A;  PM (ASA 3)   DENTAL SURGERY  11/2015   multiple tooth extraction   ESOPHAGOGASTRODUODENOSCOPY  11/29/2007   salmon-colored  tongue   longest stable at  3 cm, distal esophagus as described previously status post biopsy/ Hiatal hernia, otherwise normal stomach D1 and D2   ESOPHAGOGASTRODUODENOSCOPY  01/06/11   short segment Barrett's esophagus s/p bx/Hiatal hernia   ESOPHAGOGASTRODUODENOSCOPY (EGD) WITH PROPOFOL N/A 03/20/2014   RHWE:[id number] esophagus  short segment barrett's, bx with no dysplasia. next egd in 03/2017   ESOPHAGOGASTRODUODENOSCOPY (EGD) WITH PROPOFOL N/A 09/14/2015   Dr. Gala Romney: Barrett's without dysplasia, gastritis benign bx, hiatal hernia. next EGD 09/2018.   ESOPHAGOGASTRODUODENOSCOPY (EGD) WITH PROPOFOL N/A 07/23/2019   Procedure: ESOPHAGOGASTRODUODENOSCOPY (EGD) WITH PROPOFOL;  Surgeon: Daneil Dolin, MD;  [facility];  Service: Endoscopy;  Laterality: N/A;  3:00pm   EYE SURGERY N/A    Phreesia 03/18/2020   HERNIA REPAIR Right 07/2010   Dr. Zada Girt   JOINT REPLACEMENT     LAPAROSCOPIC CHOLECYSTECTOMY  2017   at  [facility]   POLYPECTOMY  09/14/2015   Procedure: POLYPECTOMY;  Surgeon: Daneil Dolin, MD;  Location: [facility];  Service: Endoscopy;;  ascending colon   right hip replacement  07/2010   went back in sept 2012 to fix   SHOULDER ARTHROSCOPY  2008   left   SPINE SURGERY N/A    Phreesia 03/18/2020   TOTAL HIP REVISION Right 12/17/2012   Procedure: RIGHT TOTAL HIP REVISION;  Surgeon: Mauri Pole, MD;  Location: [facility];  Service: Orthopedics;  Laterality: Right;   WRIST SURGERY Right 2011   open reduction right wrist.    SOCIAL HISTORY:  Social History   Socioeconomic History   Marital status: Married    Spouse name: louis   Number of children: 4   Years of education: 12+   Highest education level: Some college, no degree  Occupational History   Occupation: Press photographer  - retired   Occupation: non profit  Tobacco Use   Smoking status: Former    Packs/day: 0.25    Years: 25.00    Pack years: 6.25    Types: Cigarettes    Quit date: 02/07/2003    Years since quitting: 17.8   Smokeless tobacco: Never   Tobacco comments:    quit in 2004  Vaping Use   Vaping Use: Never used  Substance and Sexual Activity   Alcohol use: No   Drug use: No   Sexual activity: Yes    Birth control/protection: Surgical  Other Topics Concern   Not on file  Social History Narrative   Not on file   Social Determinants of Health   Financial Resource Strain: Low Risk    Difficulty of Paying Living Expenses: Not hard at all  Food Insecurity: No Food Insecurity   Worried About Charity fundraiser in the Last Year: Never true   Arboriculturist in the Last Year: Never true  Transportation Needs: Unmet Transportation Needs   Lack of Transportation (Medical): Yes   Lack of Transportation (Non-Medical): Yes  Physical Activity: Sufficiently Active   Days of Exercise per Week: 7 days   Minutes of Exercise per Session: 30 min  Stress: No Stress Concern Present   Feeling of Stress : Not at all   Social Connections: Moderately Integrated   Frequency of Communication with Friends and Family: More than three times a week   Frequency of Social Gatherings with Friends and Family: Once a week   Attends Religious Services: More than 4 times per year   Active Member of [employer] or Organizations: No   Attends Archivist Meetings: Never   Marital Status: Married  Human resources officer Violence: Not At Risk   Fear of Current or Ex-Partner: No   Emotionally Abused: No   Physically Abused: No   Sexually Abused: No    FAMILY HISTORY:  Family History  Problem Relation  Age of Onset   Hypertension Mother    Stroke Mother    Colon cancer Neg Hx    Anesthesia problems Neg Hx    Hypotension Neg Hx    Malignant hyperthermia Neg Hx    Pseudochol deficiency Neg Hx    Gastric cancer Neg Hx    Esophageal cancer Neg Hx     CURRENT MEDICATIONS:  Current Outpatient Medications  Medication Sig Dispense Refill   acyclovir (ZOVIRAX) 400 MG tablet Take 1 tablet (400 mg total) by mouth 2 (two) times daily. (Patient not taking: Reported on 12/01/2020) 60 tablet 3   allopurinol (ZYLOPRIM) 300 MG tablet Take 1 tablet (300 mg total) by mouth daily. 30 tablet 3   ALPRAZolam (XANAX) 0.5 MG tablet Take 1 tablet (0.5 mg total) by mouth at bedtime. 30 tablet 5   dicyclomine (BENTYL) 10 MG capsule Take 1 capsule (10 mg total) by mouth 2 (two) times daily as needed for spasms. 30 capsule 0   EPINEPHRINE 0.3 mg/0.3 mL IJ SOAJ injection INJECT 0.3 MLS INTO MUSCLE ONCE AS NEEDED (Patient not taking: No sig reported) 1 Device 2   gabapentin (NEURONTIN) 300 MG capsule TAKE 1 CAPSULE BY MOUTH ONCE DAILY FOR  CHRONIC  PAIN (Patient taking differently: TAKE 1 CAPSULE BY MOUTH ONCE DAILY FOR  CHRONIC  PAIN) 90 capsule 0   hydrochlorothiazide (HYDRODIURIL) 25 MG tablet Take 1 tablet by mouth once daily (Patient not taking: No sig reported) 90 tablet 0   losartan (COZAAR) 25 MG tablet Take 1 tablet by mouth once daily  for blood pressure (Patient not taking: No sig reported) 30 tablet 0   Oxycodone HCl 10 MG TABS Take 1 tablet (10 mg total) by mouth 4 (four) times daily. (Patient taking differently: Take 10 mg by mouth 4 (four) times daily as needed (pain).) 56 tablet 0   pantoprazole (PROTONIX) 40 MG tablet TAKE 1 TABLET BY MOUTH ONCE DAILY BEFORE BREAKFAST 90 tablet 3   potassium chloride (KLOR-CON) 10 MEQ tablet potassium chloride ER 10 mEq tablet,extended release (Patient not taking: Reported on 12/01/2020)     Probiotic Product (EQL DAILY PROBIOTIC PO) Take 1 capsule by mouth daily.     temazepam (RESTORIL) 30 MG capsule Take 1 capsule (30 mg total) by mouth at bedtime. 30 capsule 0   tiZANidine (ZANAFLEX) 4 MG tablet Take 8 mg by mouth 3 (three) times daily as needed (spasms). 180 tablet 0   topiramate (TOPAMAX) 50 MG tablet topiramate 50 mg tablet (Patient not taking: Reported on 12/01/2020)     valsartan (DIOVAN) 80 MG tablet Take by mouth. (Patient not taking: No sig reported)     No current facility-administered medications for this visit.    ALLERGIES:  Allergies  Allergen Reactions   Bee Venom Swelling and Hives   Acetaminophen     itches   Amlodipine     Hair loss   Tyloxapol    Other Itching, Rash and Swelling   Oxycodone-Acetaminophen Rash    Pt states, "this gives her a rash, but at home Renee Waters takes oxycodone for pain relief" Pt states, "this gives her a rash, but at home Renee Waters takes oxycodone for pain relief"    PHYSICAL EXAM:  Performance status (ECOG): 1 - Symptomatic but completely ambulatory  There were no vitals filed for this visit. Wt Readings from Last 3 Encounters:  11/30/20 116 lb 14.4 oz (53 kg)  11/18/20 110 lb (49.9 kg)  11/09/20 116 lb 9.6 oz (52.9 kg)  Physical Exam Vitals reviewed.  Constitutional:      Appearance: Normal appearance.  Cardiovascular:     Rate and Rhythm: Normal rate and regular rhythm.     Pulses: Normal pulses.     Heart sounds: Normal  heart sounds.  Pulmonary:     Effort: Pulmonary effort is normal.     Breath sounds: Normal breath sounds.  Abdominal:     Palpations: Abdomen is soft. There is no hepatomegaly, splenomegaly or mass.     Tenderness: There is no abdominal tenderness.  Neurological:     General: No focal deficit present.     Mental Status: Renee Waters is alert and oriented to person, place, and time.  Psychiatric:        Mood and Affect: Mood normal.        Behavior: Behavior normal.    LABORATORY DATA:  I have reviewed the labs as listed.  CBC Latest Ref Rng & Units 11/18/2020 11/09/2020 11/06/2020  WBC 4.0 - 10.5 K/uL 81.2(HH) 82.0(HH) 75.6(HH)  Hemoglobin 12.0 - 15.0 g/dL 9.3(L) 10.0(L) 10.1(L)  Hematocrit 36.0 - 46.0 % 27.9(L) 31.4(L) 30.9(L)  Platelets 150 - 400 K/uL 237 355 296   CMP Latest Ref Rng & Units 11/06/2020 10/21/2020 10/05/2020  Glucose 70 - 99 mg/dL 79 65 85  BUN 8 - 23 mg/dL _0 Creatinine 0.44 - 1.00 mg/dL 1.37(H) 1.56(H) 1.39(H)  Sodium 135 - 145 mmol/L 140 140 141  Potassium 3.5 - 5.1 mmol/L 4.2 4.1 4.1  Chloride 98 - 111 mmol/L 107 102 100  CO2 22 - 32 mmol/L 25 19(L) 21  Calcium 8.9 - 10.3 mg/dL 8.7(L) 9.1 8.1(L)  Total Protein 6.5 - 8.1 g/dL 8.6(H) 7.8 7.9  Total Bilirubin 0.3 - 1.2 mg/dL 0.7 0.3 0.4  Alkaline Phos 38 - 126 U/L 186(H) 194(H) 196(H)  AST 15 - 41 U/L 33 41(H) 39  ALT 0 - 44 U/L _1 Component Value Date/Time   RBC 2.89 (L) 11/18/2020 0955   MCV 96.5 11/18/2020 0955   MCV 91 05/27/2020 1632   MCH 32.2 11/18/2020 0955   MCHC 33.3 11/18/2020 0955   RDW 19.2 (H) 11/18/2020 0955   RDW 14.1 05/27/2020 1632   LYMPHSABS 4.9 (H) 11/18/2020 0955   LYMPHSABS 1.7 05/27/2020 1632   MONOABS 13.8 (H) 11/18/2020 0955   EOSABS 0.8 (H) 11/18/2020 0955   EOSABS 0.2 05/27/2020 1632   BASOSABS 0.0 11/18/2020 0955   BASOSABS 0.0 05/27/2020 1632    DIAGNOSTIC IMAGING:  I have independently reviewed the scans and discussed with the patient. CT Biopsy  Result  Date: 11/18/2020 CLINICAL DATA:  Leukocytosis with left shift and leukoerythroblastic reaction. Possible leukemia. Need for bone marrow biopsy. EXAM: CT GUIDED BONE MARROW ASPIRATION AND BIOPSY ANESTHESIA/SEDATION: Moderate (conscious) sedation was employed during this procedure. A total of Versed 2.5 mg and Fentanyl 100 mcg was administered intravenously by radiology nursing under my direct supervision. Moderate Sedation Time: 19 minutes. The patient's level of consciousness and vital signs were monitored continuously by radiology nursing throughout the procedure under my direct supervision. PROCEDURE: The procedure risks, benefits, and alternatives were explained to the patient. Questions regarding the procedure were encouraged and answered. The patient understands and consents to the procedure. A time out was performed prior to initiating the procedure. The right gluteal region was prepped with chlorhexidine. Sterile gown and sterile gloves were used for the procedure. Local anesthesia was provided with 1% Lidocaine. Under CT guidance,  an 11 gauge On Control bone cutting needle was advanced from a posterior approach into the right iliac bone. Needle positioning was confirmed with CT. Initial attempt at obtaining aspirate samples of bone marrow was performed. Core biopsy was performed via the On Control drill needle. Two separate core biopsy samples were obtained. COMPLICATIONS: None FINDINGS: Aspiration yielded a dry tap with no liquid bone marrow. For this reason, 2 separate core biopsy samples were obtained in slightly different positions of the right iliac bone. IMPRESSION: CT guided bone marrow biopsy of right posterior iliac bone. The aspirate attempt yielded a dry tap. Two separate core biopsy samples were obtained. Electronically Signed   By: Aletta Edouard M.D.   On: 11/18/2020 13:52   CT BONE MARROW BIOPSY & ASPIRATION  Result Date: 11/18/2020 CLINICAL DATA:  Leukocytosis with left shift and  leukoerythroblastic reaction. Possible leukemia. Need for bone marrow biopsy. EXAM: CT GUIDED BONE MARROW ASPIRATION AND BIOPSY ANESTHESIA/SEDATION: Moderate (conscious) sedation was employed during this procedure. A total of Versed 2.5 mg and Fentanyl 100 mcg was administered intravenously by radiology nursing under my direct supervision. Moderate Sedation Time: 19 minutes. The patient's level of consciousness and vital signs were monitored continuously by radiology nursing throughout the procedure under my direct supervision. PROCEDURE: The procedure risks, benefits, and alternatives were explained to the patient. Questions regarding the procedure were encouraged and answered. The patient understands and consents to the procedure. A time out was performed prior to initiating the procedure. The right gluteal region was prepped with chlorhexidine. Sterile gown and sterile gloves were used for the procedure. Local anesthesia was provided with 1% Lidocaine. Under CT guidance, an 11 gauge On Control bone cutting needle was advanced from a posterior approach into the right iliac bone. Needle positioning was confirmed with CT. Initial attempt at obtaining aspirate samples of bone marrow was performed. Core biopsy was performed via the On Control drill needle. Two separate core biopsy samples were obtained. COMPLICATIONS: None FINDINGS: Aspiration yielded a dry tap with no liquid bone marrow. For this reason, 2 separate core biopsy samples were obtained in slightly different positions of the right iliac bone. IMPRESSION: CT guided bone marrow biopsy of right posterior iliac bone. The aspirate attempt yielded a dry tap. Two separate core biopsy samples were obtained. Electronically Signed   By: Aletta Edouard M.D.   On: 11/18/2020 13:52   US Abdomen Limited RUQ (LIVER/GB)  Result Date: 11/18/2020 CLINICAL DATA:  Right upper quadrant tenderness x1 year. History of prior cholecystectomy. EXAM: ULTRASOUND ABDOMEN  LIMITED RIGHT UPPER QUADRANT COMPARISON:  None. FINDINGS: Gallbladder: The gallbladder is surgically absent. Common bile duct: Diameter: 5.1 mm Liver: No focal lesion identified. Within normal limits in parenchymal echogenicity. Portal vein is patent on color Doppler imaging with normal direction of blood flow towards the liver. Other: None. IMPRESSION: 1. Evidence of prior cholecystectomy. 2. Otherwise, unremarkable right upper quadrant ultrasound. Electronically Signed   By: Virgina Norfolk M.D.   On: 11/18/2020 19:38     ASSESSMENT:  Leukocytosis with left shift: - CBC on 11/06/2020 with white count 75.6, differential 59% neutrophils, 12% monocytes, 4% lymphocytes, 1 percentage of eosinophils and basophils, 6% band neutrophils, 12% myelocytes, 1% promyelocytes, 29% blasts. - Pathologist review of blood smear reported as leukoerythroblastic reaction. - 25 pound weight loss in the last 6 months, unintentional.  Decreased appetite.  Reports fatigue for the last few months.  Reports night sweats x3 in the last 1 month.  2.  Social/family history: -  Renee Waters lives at home and is able to do all her ADLs and IADLs although Renee Waters is getting tired lately.  Renee Waters reports quitting smoking 6 months ago and smoked 1 pack/week for 43 years. - Renee Waters believes that her mother had some kind of leukemia.  No other malignancies.   PLAN:  Leukocytosis: -Bone marrow biopsy from 11/18/2020 was consistent with granulocytic proliferation with differential including CMML versus myeloproliferative disorder. - BCR/ABL is negative. - JAK2 V617F testing was unable to be done.  We will reach out to the lab to find out the reason.  We will send another sample today. - Myeloid NGS panel is also pending. -Physical examination for spleen was limited as Renee Waters has tenderness.  I have recommended ultrasound of the spleen. - I have talked to her daughter on the phone.  RTC 2 to 3 weeks for follow-up.  2.  Right upper quadrant  tenderness: -Right upper quadrant ultrasound was benign.  3.  CKD: -Creatinine baseline between 1.3-1.5. - Continue follow-up with Dr. Theador Hawthorne. - We will check lysozyme levels.  4.  Hyperuricemia: -Continue allopurinol 300 mg daily. - We have checked a uric acid today which normalized.  Orders placed this encounter:  No orders of the defined types were placed in this encounter.    Derek Jack, MD [place] [phone]   I, Thana Ates, am acting as a scribe for Dr. Derek Jack.  I, Derek Jack MD, have reviewed the above documentation for accuracy and completeness, and I agree with the above.

## 2020-12-14 ENCOUNTER — Inpatient Hospital Stay (HOSPITAL_COMMUNITY): Payer: Medicare Other | Attending: Hematology | Admitting: Hematology

## 2020-12-14 ENCOUNTER — Other Ambulatory Visit: Payer: Self-pay

## 2020-12-14 ENCOUNTER — Inpatient Hospital Stay (HOSPITAL_COMMUNITY): Payer: Medicare Other

## 2020-12-14 VITALS — BP 108/71 | HR 83 | Temp 98.7°F | Resp 18 | Wt 117.0 lb

## 2020-12-14 DIAGNOSIS — R059 Cough, unspecified: Secondary | ICD-10-CM | POA: Diagnosis not present

## 2020-12-14 DIAGNOSIS — N183 Chronic kidney disease, stage 3 unspecified: Secondary | ICD-10-CM | POA: Diagnosis not present

## 2020-12-14 DIAGNOSIS — R1110 Vomiting, unspecified: Secondary | ICD-10-CM | POA: Insufficient documentation

## 2020-12-14 DIAGNOSIS — R5383 Other fatigue: Secondary | ICD-10-CM | POA: Insufficient documentation

## 2020-12-14 DIAGNOSIS — M329 Systemic lupus erythematosus, unspecified: Secondary | ICD-10-CM | POA: Diagnosis not present

## 2020-12-14 DIAGNOSIS — R10811 Right upper quadrant abdominal tenderness: Secondary | ICD-10-CM | POA: Insufficient documentation

## 2020-12-14 DIAGNOSIS — L299 Pruritus, unspecified: Secondary | ICD-10-CM | POA: Insufficient documentation

## 2020-12-14 DIAGNOSIS — Z79899 Other long term (current) drug therapy: Secondary | ICD-10-CM | POA: Insufficient documentation

## 2020-12-14 DIAGNOSIS — G709 Myoneural disorder, unspecified: Secondary | ICD-10-CM | POA: Insufficient documentation

## 2020-12-14 DIAGNOSIS — E790 Hyperuricemia without signs of inflammatory arthritis and tophaceous disease: Secondary | ICD-10-CM | POA: Diagnosis not present

## 2020-12-14 DIAGNOSIS — R42 Dizziness and giddiness: Secondary | ICD-10-CM | POA: Insufficient documentation

## 2020-12-14 DIAGNOSIS — D72829 Elevated white blood cell count, unspecified: Secondary | ICD-10-CM | POA: Diagnosis not present

## 2020-12-14 LAB — URIC ACID: Uric Acid, Serum: 3 mg/dL (ref 2.5–7.1)

## 2020-12-14 NOTE — Telephone Encounter (Signed)
Patient aware.

## 2020-12-14 NOTE — Patient Instructions (Addendum)
[place] at [facility] Discharge Instructions  You were seen and examined today by [name]. Due to your bloating, [name] has recommended an ultrasound of your Spleen and additional lab work. [facility] was not able to run the Jak2 test, we will redraw this today. The [facility] Myeloid Panel is still pending. When that results, [name] will be able to state a definitive diagnosis.  Follow-up as scheduled.   Thank you for choosing [place] at [facility] to provide your oncology and hematology care.  To afford each patient quality time with our provider, please arrive at least 15 minutes before your scheduled appointment time.   If you have a lab appointment with the [facility] please come in thru the Main Entrance and check in at the main information desk.  You need to re-schedule your appointment should you arrive 10 or more minutes late.  We strive to give you quality time with our providers, and arriving late affects you and other patients whose appointments are after yours.  Also, if you no show three or more times for appointments you may be dismissed from the clinic at the providers discretion.     Again, thank you for choosing [facility].  Our hope is that these requests will decrease the amount of time that you wait before being seen by our physicians.       _____________________________________________________________  Should you have questions after your visit to [facility], please contact our office at [phone] and follow the prompts.  Our office hours are 8:00 a.m. and 4:30 p.m. Monday - Friday.  Please note that voicemails left after 4:00 p.m. may not be returned until the following business day.  We are closed weekends and major holidays.  You do have access to a nurse 24-7, just call the main number to the clinic [phone] and do not press any options, hold on the line and a  nurse will answer the phone.    For prescription refill requests, have your pharmacy contact our office and allow 72 hours.    Due to Covid, you will need to wear a mask upon entering the hospital. If you do not have a mask, a mask will be given to you at the Main Entrance upon arrival. For doctor visits, patients may have 1 support person age 6 or older with them. For treatment visits, patients can not have anyone with them due to social distancing guidelines and our immunocompromised population.

## 2020-12-15 ENCOUNTER — Ambulatory Visit (HOSPITAL_COMMUNITY)
Admission: RE | Admit: 2020-12-15 | Discharge: 2020-12-15 | Disposition: A | Discharge status: Home / Self Care | Payer: Medicare Other | Source: Ambulatory Visit | Attending: Hematology | Admitting: Hematology

## 2020-12-15 DIAGNOSIS — R10811 Right upper quadrant abdominal tenderness: Secondary | ICD-10-CM | POA: Diagnosis not present

## 2020-12-15 DIAGNOSIS — C9590 Leukemia, unspecified not having achieved remission: Secondary | ICD-10-CM | POA: Diagnosis not present

## 2020-12-15 DIAGNOSIS — D72829 Elevated white blood cell count, unspecified: Secondary | ICD-10-CM | POA: Insufficient documentation

## 2020-12-16 DIAGNOSIS — H35373 Puckering of macula, bilateral: Secondary | ICD-10-CM | POA: Diagnosis not present

## 2020-12-16 DIAGNOSIS — H15011 Anterior scleritis, right eye: Secondary | ICD-10-CM | POA: Diagnosis not present

## 2020-12-16 LAB — LYSOZYME, SERUM: Lysozyme: 10.5 ug/mL (ref 2.5–12.9)

## 2020-12-17 ENCOUNTER — Ambulatory Visit (INDEPENDENT_AMBULATORY_CARE_PROVIDER_SITE_OTHER): Payer: Medicare Other | Admitting: *Deleted

## 2020-12-17 ENCOUNTER — Ambulatory Visit (HOSPITAL_COMMUNITY): Payer: Medicare Other | Admitting: Hematology

## 2020-12-17 DIAGNOSIS — R634 Abnormal weight loss: Secondary | ICD-10-CM

## 2020-12-17 DIAGNOSIS — I10 Essential (primary) hypertension: Secondary | ICD-10-CM

## 2020-12-17 DIAGNOSIS — F331 Major depressive disorder, recurrent, moderate: Secondary | ICD-10-CM

## 2020-12-17 LAB — FLOW CYTOMETRY

## 2020-12-17 NOTE — Patient Instructions (Signed)
Visit Information  (Copy and paste patient goals from clinical care plan here)  The patient verbalized understanding of instructions, educational materials, and care plan provided today and declined offer to receive copy of patient instructions, educational materials, and care plan.   Telephone follow up appointment with care management team member scheduled for:01/20/21  Eduard Clos MSW, LCSW Licensed Clinical Social Education officer, environmental Primary Care [phone]

## 2020-12-17 NOTE — Chronic Care Management (AMB) (Signed)
Chronic Care Management    Clinical Social Work Note  12/17/2020 Name: Renee Waters MRN: [id number] DOB: 07/09/1945  Renee Waters is a 75 y.o. year old female who is a primary care patient of Moshe Cipro, Norwood Levo, MD. The CCM team was consulted to assist the patient with chronic disease management and/or care coordination needs related to: [place] and Resources.   Engaged with patient by telephone for initial visit in response to provider referral for social work chronic care management and care coordination services.   Consent to Services:  The patient was given information about Chronic Care Management services, agreed to services, and gave verbal consent prior to initiation of services.  Please see initial visit note for detailed documentation.   Patient agreed to services and consent obtained.   Assessment: Review of patient past medical history, allergies, medications, and health status, including review of relevant consultants reports was performed today as part of a comprehensive evaluation and provision of chronic care management and care coordination services.     SDOH (Social Determinants of Health) assessments and interventions performed:  SDOH Interventions    Flowsheet Row Most Recent Value  SDOH Interventions   Depression Interventions/Treatment  Referral to Psychiatry, Counseling, Medication        Advanced Directives Status: See Care Plan for related entries.  CCM Care Plan  Allergies  Allergen Reactions   Bee Venom Swelling and Hives   Acetaminophen     itches   Amlodipine     Hair loss   Tyloxapol    Other Itching, Rash and Swelling   Oxycodone-Acetaminophen Rash    Pt states, "this gives her a rash, but at home she takes oxycodone for pain relief" Pt states, "this gives her a rash, but at home she takes oxycodone for pain relief"    Outpatient Encounter Medications as of 12/17/2020  Medication Sig Note   ALPRAZolam (XANAX) 0.5 MG  tablet Take 1 tablet (0.5 mg total) by mouth at bedtime.    acyclovir (ZOVIRAX) 400 MG tablet Take 1 tablet (400 mg total) by mouth 2 (two) times daily. 10/28/2020: Pt reports taking med as needed.    allopurinol (ZYLOPRIM) 300 MG tablet Take 1 tablet (300 mg total) by mouth daily.    dicyclomine (BENTYL) 10 MG capsule Take 1 capsule (10 mg total) by mouth 2 (two) times daily as needed for spasms. (Patient not taking: Reported on 12/14/2020)    EPINEPHRINE 0.3 mg/0.3 mL IJ SOAJ injection INJECT 0.3 MLS INTO MUSCLE ONCE AS NEEDED    gabapentin (NEURONTIN) 300 MG capsule TAKE 1 CAPSULE BY MOUTH ONCE DAILY FOR  CHRONIC  PAIN (Patient taking differently: TAKE 1 CAPSULE BY MOUTH ONCE DAILY FOR  CHRONIC  PAIN) 10/28/2020: Pt reports taking med as needed when she has herpes breakout.    hydrochlorothiazide (HYDRODIURIL) 25 MG tablet Take 1 tablet by mouth once daily    losartan (COZAAR) 25 MG tablet Take 1 tablet by mouth once daily for blood pressure    Oxycodone HCl 10 MG TABS Take 1 tablet (10 mg total) by mouth 4 (four) times daily. (Patient taking differently: Take 10 mg by mouth 4 (four) times daily as needed (pain).)    pantoprazole (PROTONIX) 40 MG tablet TAKE 1 TABLET BY MOUTH ONCE DAILY BEFORE BREAKFAST    potassium chloride (KLOR-CON) 10 MEQ tablet     Probiotic Product (EQL DAILY PROBIOTIC PO) Take 1 capsule by mouth daily.    temazepam (RESTORIL) 30 MG capsule Take 1  capsule (30 mg total) by mouth at bedtime.    tiZANidine (ZANAFLEX) 4 MG tablet Take 8 mg by mouth 3 (three) times daily as needed (spasms). (Patient not taking: Reported on 12/14/2020)    topiramate (TOPAMAX) 50 MG tablet     valsartan (DIOVAN) 80 MG tablet Take by mouth.    No facility-administered encounter medications on file as of 12/17/2020.    Patient Active Problem List   Diagnosis Date Noted   Leukocytosis 11/09/2020   CKD (chronic kidney disease) stage 3, GFR 30-59 ml/min (HCC) 10/29/2020   Dermatomycosis 08/02/2020    HSV-2 (herpes simplex virus 2) infection 08/02/2020   Upper respiratory tract infection 07/09/2020   Rib pain on left side 03/21/2020   Generalized pruritus 03/21/2020   Mildly underweight adult 09/19/2019   MDD (major depressive disorder), recurrent, in full remission ([place]) 03/27/2019   Educated about COVID-19 virus infection 06/24/2018   MDD (major depressive disorder), recurrent episode, moderate ([place]) 10/28/2016   Thoracic spine pain 08/31/2016   Anemia, normocytic normochromic 05/27/2015   IDA (iron deficiency anemia) 03/02/2015   Unsteady gait 02/25/2015   Mucosal abnormality of esophagus    Systemic lupus ([place]) 10/31/2012   Vitamin D deficiency 03/16/2012   Generalized pain 03/15/2012   Constipation 10/06/2011   Hip pain 06/29/2011   Barrett's esophagus 12/13/2010   Western blot positive HSV2 06/22/2007   Depression 06/22/2007   Essential hypertension 06/22/2007   GERD (gastroesophageal reflux disease) 06/22/2007   DJD (degenerative joint disease) 06/22/2007   NECK PAIN, CHRONIC 06/22/2007   Chronic midline low back pain with sciatica 06/22/2007   Insomnia secondary to depression with anxiety 06/22/2007    Conditions to be addressed/monitored: Anxiety and Depression; Mental Health Concerns   Care Plan : LCSW Plan of Care  Updates made by Deirdre Peer, LCSW since 12/17/2020 12:00 AM     Problem: Symptoms (Depression)      Long-Range Goal: Symptoms Monitored and Managed   Start Date: 12/17/2020  Expected End Date: 03/08/2021  This Visit's Progress: On track  Priority: High  Note:   Current Barriers:  Chronic Mental Health needs related to depression, anxiety Mental Health Concerns  Suicidal Ideation/Homicidal Ideation: No  Clinical Social Work Goal(s):  patient will work with SW  as needed  by telephone  to reduce or manage symptoms related to depression demonstrate a reduction in symptoms related to :Anxiety and Depression   Interventions: Patient  interviewed and appropriate assessments performed: PHQ 9 1:1 collaboration with [name], MD regarding development and update of comprehensive plan of care as evidenced by provider attestation and co-signature Patient interviewed and appropriate assessments performed Provided mental health counseling with regard to treatment plans/needs  v Provided patient with information about mental health 988 hotline Discussed plans with patient for ongoing care management follow up and provided patient with direct contact information for care management team Referred patient to [facility] for linking pt with Psychiatrist and long term follow up  therapy/counseling Depression screen reviewed  PHQ2/ PHQ9 completed Solution-Focused Strategies Active listening / Reflection utilized  Emotional Support Provided Problem [place] strategies reviewed Provided psychoeducation for mental health needs   Patient Self Care Activities:  Self administers medications as prescribed Attends church or other social activities Performs ADL's independently Calls provider office for new concerns or questions Ability for insight Independent living Motivation for treatment Strong family or social support  Patient Coping Strengths:  Supportive Relationships Family Friends Hopefulness Self Advocate Able to Communicate Effectively  Patient Self Care Deficits:  No current provider for Psychiatry or counseling    Patient Goals:  - avoid negative self-talk - develop a personal safety plan - have a plan for how to handle bad days - journal feelings and what helps to feel better or worse - spend time or talk with others every day - watch for early signs of feeling worse - begin personal counseling - start or continue a personal journal - practice positive thinking and self-talk - call to cancel if needed - keep a calendar with prescription refill dates - keep a calendar with appointment  dates  Follow Up Plan: Appointment scheduled for SW follow up with client by phone on:  01/20/21     Follow Up Plan: Appointment scheduled for SW follow up with client by phone on: 01/20/21      Eduard Clos MSW, LCSW Licensed Clinical Social Worker [facility] [phone]

## 2020-12-21 ENCOUNTER — Other Ambulatory Visit: Payer: Self-pay | Admitting: Family Medicine

## 2020-12-22 LAB — MISC LABCORP TEST (SEND OUT): Labcorp test code: 451953

## 2020-12-23 DIAGNOSIS — H15011 Anterior scleritis, right eye: Secondary | ICD-10-CM | POA: Diagnosis not present

## 2020-12-23 LAB — JAK2 V617F, W REFLEX TO CALR/E12/MPL

## 2020-12-23 LAB — CALR + JAK2 E12-15 + MPL (REFLEXED)

## 2020-12-28 ENCOUNTER — Ambulatory Visit (INDEPENDENT_AMBULATORY_CARE_PROVIDER_SITE_OTHER): Payer: Medicare Other

## 2020-12-28 ENCOUNTER — Other Ambulatory Visit: Payer: Self-pay

## 2020-12-28 ENCOUNTER — Other Ambulatory Visit: Payer: Self-pay | Admitting: Family Medicine

## 2020-12-28 DIAGNOSIS — Z0000 Encounter for general adult medical examination without abnormal findings: Secondary | ICD-10-CM

## 2020-12-28 NOTE — Progress Notes (Signed)
Subjective:   Renee Waters is a 75 y.o. female who presents for Medicare Annual (Subsequent) preventive examination. I connected with  Renee Waters on 12/28/20 by a audio enabled telemedicine application and verified that I am speaking with the correct person using two identifiers.  Patient Location: Home  Provider Location: Office/Clinic  I discussed the limitations of evaluation and management by telemedicine. The patient expressed understanding and agreed to proceed.  Review of Systems    Defer to PCP Cardiac Risk Factors include: none     Objective:    Today's Vitals   12/28/20 1443  PainSc: 8    There is no height or weight on file to calculate BMI.  Advanced Directives 12/28/2020 12/14/2020 11/30/2020 11/18/2020 11/09/2020 11/06/2020 10/13/2020  Does Patient Have a Medical Advance Directive? Yes Yes Yes Yes Yes No Yes  Type of Paramedic of [place];Living will;Out of facility DNR (pink MOST or yellow form) [place];Living will [place];Living will [place];Living will [place];Living will - -  Does patient want to make changes to medical advance directive? No - Patient declined No - Patient declined No - Patient declined No - Patient declined No - Patient declined - No - Patient declined  Copy of [place] in Chart? No - copy requested No - copy requested No - copy requested No - copy requested No - copy requested - -  Would patient like information on creating a medical advance directive? No - Patient declined No - Patient declined No - Patient declined No - Patient declined No - Patient declined No - Patient declined -  Pre-existing out of facility DNR order (yellow form or pink MOST form) - - - - - - -    Current Medications (verified) Outpatient Encounter Medications as of 12/28/2020  Medication Sig   allopurinol (ZYLOPRIM) 300 MG tablet Take 1  tablet (300 mg total) by mouth daily.   ALPRAZolam (XANAX) 0.5 MG tablet Take 1 tablet (0.5 mg total) by mouth at bedtime.   EPINEPHRINE 0.3 mg/0.3 mL IJ SOAJ injection INJECT 0.3 MLS INTO MUSCLE ONCE AS NEEDED   gabapentin (NEURONTIN) 300 MG capsule TAKE 1 CAPSULE BY MOUTH ONCE DAILY FOR  CHRONIC  PAIN (Patient taking differently: TAKE 1 CAPSULE BY MOUTH ONCE DAILY FOR  CHRONIC  PAIN)   hydrochlorothiazide (HYDRODIURIL) 25 MG tablet Take 1 tablet by mouth once daily   losartan (COZAAR) 25 MG tablet Take 1 tablet by mouth once daily for blood pressure   Oxycodone HCl 10 MG TABS Take 1 tablet (10 mg total) by mouth 4 (four) times daily. (Patient taking differently: Take 10 mg by mouth 4 (four) times daily as needed (pain).)   pantoprazole (PROTONIX) 40 MG tablet TAKE 1 TABLET BY MOUTH ONCE DAILY BEFORE BREAKFAST   potassium chloride (KLOR-CON) 10 MEQ tablet    temazepam (RESTORIL) 30 MG capsule Take 1 capsule (30 mg total) by mouth at bedtime.   tiZANidine (ZANAFLEX) 4 MG tablet Take 8 mg by mouth 3 (three) times daily as needed (spasms).   valsartan (DIOVAN) 80 MG tablet Take by mouth.   acyclovir (ZOVIRAX) 400 MG tablet Take 1 tablet (400 mg total) by mouth 2 (two) times daily. (Patient not taking: Reported on 12/28/2020)   dicyclomine (BENTYL) 10 MG capsule Take 1 capsule (10 mg total) by mouth 2 (two) times daily as needed for spasms. (Patient not taking: Reported on 12/14/2020)   Probiotic  Product (EQL DAILY PROBIOTIC PO) Take 1 capsule by mouth daily. (Patient not taking: Reported on 12/28/2020)   topiramate (TOPAMAX) 50 MG tablet  (Patient not taking: Reported on 12/28/2020)   [DISCONTINUED] hydrochlorothiazide (HYDRODIURIL) 25 MG tablet Take 1 tablet by mouth once daily   No facility-administered encounter medications on file as of 12/28/2020.    Allergies (verified) Bee venom, Acetaminophen, Amlodipine, Tyloxapol, Other, and Oxycodone-acetaminophen   History: Past Medical History:   Diagnosis Date   Acute cholangitis    Allergy    Anemia    Anxiety    Arthritis    Phreesia 03/18/2020   Barrett's esophagus    Cataract    Chronic back pain    Chronic neck pain    CKD (chronic kidney disease) stage 3, GFR 30-59 ml/min ([place]) 10/29/2020   Depression    Genital herpes    GERD (gastroesophageal reflux disease)    H/O degenerative disc disease    History of blood transfusion    Hypertension    Insomnia    Lupus (systemic lupus erythematosus) ([place])    Neuromuscular disorder ([place])    Osteoarthritis    S/P colonoscopy June 2005   normal, no polyps   S/P endoscopy June 2005, Oct 2009   2005: short-segment Barrett's, 2009: short-segment Barrett's   UTI (lower urinary tract infection) 11/2012   Past Surgical History:  Procedure Laterality Date   ABDOMINAL HYSTERECTOMY     BACK SURGERY     BIOPSY N/A 03/20/2014   Procedure: BIOPSY;  Surgeon: Daneil Dolin, MD;  Location: [facility];  Service: Endoscopy;  Laterality: N/A;   BIOPSY  09/14/2015   Procedure: BIOPSY;  Surgeon: Daneil Dolin, MD;  [facility];  Service: Endoscopy;;  esophageal and gastric   BIOPSY  07/23/2019   Procedure: BIOPSY;  Surgeon: Daneil Dolin, MD;  [facility];  Service: Endoscopy;;  esophageal    CARPAL TUNNEL RELEASE Left 2013   cervical disectomy  2002   CESAREAN SECTION N/A    Phreesia 03/18/2020   CHOLECYSTECTOMY     with lysis of adhesions for sbo; "ruptured gallbladder".   COLONOSCOPY  11/09/2011   RMR: Melanosis coli   COLONOSCOPY WITH PROPOFOL N/A 09/14/2015   Dr. Gala Romney: diverticulosis, 59mm TA removed. next TCS 09/2020.    COLONOSCOPY WITH PROPOFOL N/A 04/30/2020   Procedure: COLONOSCOPY WITH PROPOFOL;  Surgeon: Daneil Dolin, MD;  [facility];  Service: Endoscopy;  Laterality: N/A;  PM (ASA 3)   DENTAL SURGERY  11/2015   multiple tooth extraction   ESOPHAGOGASTRODUODENOSCOPY  11/29/2007   salmon-colored  tongue   longest stable at  3 cm, distal  esophagus as described previously status post biopsy/ Hiatal hernia, otherwise normal stomach D1 and D2   ESOPHAGOGASTRODUODENOSCOPY  01/06/11   short segment Barrett's esophagus s/p bx/Hiatal hernia   ESOPHAGOGASTRODUODENOSCOPY (EGD) WITH PROPOFOL N/A 03/20/2014   YQI:[id number] distal esophagus short segment barrett's, bx with no dysplasia. next egd in 03/2017   ESOPHAGOGASTRODUODENOSCOPY (EGD) WITH PROPOFOL N/A 09/14/2015   Dr. Gala Romney: Barrett's without dysplasia, gastritis benign bx, hiatal hernia. next EGD 09/2018.   ESOPHAGOGASTRODUODENOSCOPY (EGD) WITH PROPOFOL N/A 07/23/2019   Procedure: ESOPHAGOGASTRODUODENOSCOPY (EGD) WITH PROPOFOL;  Surgeon: Daneil Dolin, MD;  [facility];  Service: Endoscopy;  Laterality: N/A;  3:00pm   EYE SURGERY N/A    Phreesia 03/18/2020   HERNIA REPAIR Right 07/2010   Dr. Zada Girt   JOINT REPLACEMENT     LAPAROSCOPIC  CHOLECYSTECTOMY  2017   at [facility]   POLYPECTOMY  09/14/2015   Procedure: POLYPECTOMY;  Surgeon: Daneil Dolin, MD;  [facility];  Service: Endoscopy;;  ascending colon   right hip replacement  07/2010   went back in sept 2012 to fix   SHOULDER ARTHROSCOPY  2008   left   SPINE SURGERY N/A    Phreesia 03/18/2020   TOTAL HIP REVISION Right 12/17/2012   Procedure: RIGHT TOTAL HIP REVISION;  Surgeon: Mauri Pole, MD;  Location: [facility];  Service: Orthopedics;  Laterality: Right;   WRIST SURGERY Right 2011   open reduction right wrist.   Family History  Problem Relation Age of Onset   Hypertension Mother    Stroke Mother    Colon cancer Neg Hx    Anesthesia problems Neg Hx    Hypotension Neg Hx    Malignant hyperthermia Neg Hx    Pseudochol deficiency Neg Hx    Gastric cancer Neg Hx    Esophageal cancer Neg Hx    Social History   Socioeconomic History   Marital status: Married    Spouse name: louis   Number of children: 4   Years of education: 12+   Highest education level: Some college, no degree   Occupational History   Occupation: Press photographer  - retired   Occupation: non profit  Tobacco Use   Smoking status: Former    Packs/day: 0.25    Years: 25.00    Pack years: 6.25    Types: Cigarettes    Quit date: 02/07/2003    Years since quitting: 17.9   Smokeless tobacco: Never   Tobacco comments:    quit in 2004  Vaping Use   Vaping Use: Never used  Substance and Sexual Activity   Alcohol use: No   Drug use: No   Sexual activity: Not Currently    Birth control/protection: Surgical  Other Topics Concern   Not on file  Social History Narrative   Not on file   Social Determinants of Health   Financial Resource Strain: Low Risk    Difficulty of Paying Living Expenses: Not hard at all  Food Insecurity: No Food Insecurity   Worried About Charity fundraiser in the Last Year: Never true   Ran Out of Food in the Last Year: Never true  Transportation Needs: No Transportation Needs   Lack of Transportation (Medical): No   Lack of Transportation (Non-Medical): No  Physical Activity: Insufficiently Active   Days of Exercise per Week: 6 days   Minutes of Exercise per Session: 20 min  Stress: Stress Concern Present   Feeling of Stress : Very much  Social Connections: Moderately Integrated   Frequency of Communication with Friends and Family: More than three times a week   Frequency of Social Gatherings with Friends and Family: Once a week   Attends Religious Services: More than 4 times per year   Active Member of [employer] or Organizations: No   Attends Archivist Meetings: Never   Marital Status: Married    Tobacco Counseling Counseling given: Not Answered Tobacco comments: quit in 2004   Clinical Intake:  Pre-visit preparation completed: Yes  Pain Score: 8  Pain Type: Acute pain Pain Location: Abdomen Pain Descriptors / Indicators: Stabbing        How often do you need to have someone help you when you read instructions, pamphlets, or other written  materials from your doctor or pharmacy?: 2 - Rarely What  is the last grade level you completed in school?: 3yrs college  Diabetic?no  Interpreter Needed?: No  Information entered by :: Judeen Hammans CMA   Activities of Daily Living In your present state of health, do you have any difficulty performing the following activities: 12/28/2020 04/28/2020  Hearing? N N  Vision? Y -  Difficulty concentrating or making decisions? N N  Walking or climbing stairs? Y Y  Dressing or bathing? N N  Doing errands, shopping? N N  Preparing Food and eating ? N -  Using the Toilet? N -  In the past six months, have you accidently leaked urine? N -  Do you have problems with loss of bowel control? N -  Managing your Medications? N -  Managing your Finances? N -  Housekeeping or managing your Housekeeping? N -  Some recent data might be hidden    Patient Care Team: [name], MD as PCP - General Rourk, [name], MD (Gastroenterology) Carole Civil, MD as Consulting Physician (Orthopedic Surgery) Kassie Mends, RN as [place] Management Derek Jack, MD as Medical Oncologist (Oncology) Brien Mates, RN as Oncology Nurse Navigator (Oncology) Deirdre Peer, LCSW as Social Worker (Licensed Clinical Social Worker)  Indicate any recent [employer] you may have received from other than [facility] providers in the past year (date may be approximate).     Assessment:   This is a routine wellness examination for [place].  Hearing/Vision screen No results found.  Dietary issues and exercise activities discussed: Current Exercise Habits: Home exercise routine, Type of exercise: walking, Time (Minutes): 20, Frequency (Times/Week): 5, Weekly Exercise (Minutes/Week): 100, Intensity: Mild, Exercise limited by: None identified   Goals Addressed               This Visit's Progress     Begin and Stick with Counseling-Depression (pt-stated)         Timeframe:  Long-Range Goal Priority:  High Start Date:       12/17/20                      Expected End Date:  03/08/21                     Follow Up Date 01/20/21    - expect a phone call from [place] to arrange for local in-person counseling and Psychiatrist - keep 90 percent of counseling appointments - schedule counseling appointment    Why is this important?   Beating depression may take some time.  If you don't feel better right away, don't give up on your treatment plan.    Notes: Pt states that the therapists did not show today      Depression Screen PHQ 2/9 Scores 12/28/2020 12/17/2020 10/28/2020 10/13/2020 10/13/2020 07/30/2020 07/14/2020  PHQ - 2 Score - 4 3 1 1 1  0  PHQ- 9 Score - 12 10 - - - -  Exception Documentation Patient refusal - - - - - -    Fall Risk Fall Risk  12/28/2020 10/28/2020 10/13/2020 09/02/2020 07/30/2020  Falls in the past year? 0 1 0 0 0  Comment - - - - -  Number falls in past yr: 0 0 0 0 0  Injury with Fall? 0 0 0 0 0  Risk for fall due to : No Fall Risks Impaired mobility;Impaired balance/gait - - No Fall Risks  Follow up - - - - Falls evaluation completed  FALL RISK PREVENTION PERTAINING TO THE HOME:  Any stairs in or around the home? Yes  If so, are there any without handrails? Yes  Home free of loose throw rugs in walkways, pet beds, electrical cords, etc? No  Adequate lighting in your home to reduce risk of falls? Yes   ASSISTIVE DEVICES UTILIZED TO PREVENT FALLS:  Life alert? No  Use of a cane, walker or w/c? No  Grab bars in the bathroom? Yes  Shower chair or bench in shower? Yes  Elevated toilet seat or a handicapped toilet? Yes     Cognitive Function: MMSE - Mini Mental State Exam 12/03/2018  Orientation to time 5  Orientation to Place 5  Registration 3  Attention/ Calculation 5  Recall 3  Language- name 2 objects 2  Language- repeat 1  Language- follow 3 step command 3  Language- read & follow direction 1  Write a  sentence 1  Copy design 1  Total score 30     6CIT Screen 12/28/2020 12/18/2019 12/10/2018 11/29/2017 02/08/2017  What Year? 0 points 0 points 0 points 0 points 0 points  What month? 0 points 0 points 0 points 0 points 0 points  What time? 0 points 0 points 0 points 0 points 0 points  Count back from 20 0 points 0 points 0 points 0 points 0 points  Months in reverse 0 points 0 points 0 points 0 points 0 points  Repeat phrase 0 points 0 points 0 points 2 points 0 points  Total Score 0 0 0 2 0    Immunizations Immunization History  Administered Date(s) Administered   Fluad Quad(high Dose 65+) 12/03/2018, 11/13/2019, 10/28/2020   Influenza Split 11/08/2010, 12/06/2011   Influenza Whole 11/06/2006, 11/24/2009   Influenza, High Dose Seasonal PF 11/09/2017   Influenza,inj,Quad PF,6+ Mos 10/31/2012, 01/14/2014, 11/25/2014, 10/05/2015, 11/24/2016   Moderna Sars-Covid-2 Vaccination 04/04/2019, 05/03/2019, 01/28/2020, 09/04/2020   Pneumococcal Conjugate-13 09/17/2013   Pneumococcal Polysaccharide-23 02/22/2011   Td 07/31/2007   Tdap 09/05/2017   Zoster, Live 07/31/2007    TDAP status: Up to date  Flu Vaccine status: Up to date  Pneumococcal vaccine status: Up to date  Covid-19 vaccine status: Information provided on how to obtain vaccines.   Qualifies for Shingles Vaccine? Yes   Zostavax completed No   Shingrix Completed?: No.    Education has been provided regarding the importance of this vaccine. Patient has been advised to call insurance company to determine out of pocket expense if they have not yet received this vaccine. Advised may also receive vaccine at local pharmacy or Health Dept. Verbalized acceptance and understanding.  Screening Tests Health Maintenance  Topic Date Due   Zoster Vaccines- Shingrix (1 of 2) Never done   COVID-19 Vaccine (5 - Booster for Moderna series) 10/30/2020   TETANUS/TDAP  09/06/2027   COLONOSCOPY (Pts 45-24yrs Insurance coverage will need to be  confirmed)  05/01/2030   Pneumonia Vaccine 70+ Years old  Completed   INFLUENZA VACCINE  Completed   DEXA SCAN  Completed   Hepatitis C Screening  Completed   HPV VACCINES  Aged Out    Health Maintenance  Health Maintenance Due  Topic Date Due   Zoster Vaccines- Shingrix (1 of 2) Never done   COVID-19 Vaccine (5 - Booster for Moderna series) 10/30/2020    Colorectal cancer screening: Type of screening: Colonoscopy. Completed 04/30/2020. Repeat every 10 years  Mammogram status: No longer required due to age.  Bone Density status: Completed 03/08/2013. Results  reflect: Bone density results: NORMAL. Repeat every n/a years.  Lung Cancer Screening: (Low Dose CT Chest recommended if Age 56-80 years, 30 pack-year currently smoking OR have quit w/in 15years.) does not qualify.   Lung Cancer Screening Referral: n/a  Additional Screening:  Hepatitis C Screening: does not qualify; Completed 02/25/2015  Vision Screening: Recommended annual ophthalmology exams for early detection of glaucoma and other disorders of the eye. Is the patient up to date with their annual eye exam?  Yes  Who is the provider or what is the name of the office in which the patient attends annual eye exams? [facility] If pt is not established with a provider, would they like to be referred to a provider to establish care?  N/a .   Dental Screening: Recommended annual dental exams for proper oral hygiene  Community Resource Referral / Chronic Care Management: CRR required this visit?  No   CCM required this visit?  No      Plan:     I have personally reviewed and noted the following in the patient's chart:   Medical and social history Use of alcohol, tobacco or illicit drugs  Current medications and supplements including opioid prescriptions.  Functional ability and status Nutritional status Physical activity Advanced directives List of other physicians Hospitalizations, surgeries, and ER  visits in previous 12 months Vitals Screenings to include cognitive, depression, and falls Referrals and appointments  In addition, I have reviewed and discussed with patient certain preventive protocols, quality metrics, and best practice recommendations. A written personalized care plan for preventive services as well as general preventive health recommendations were provided to patient.     Earline Mayotte, [place]   12/28/2020   Nurse Notes:  Ms. Clifton , Thank you for taking time to come for your Medicare Wellness Visit. I appreciate your ongoing commitment to your health goals. Please review the following plan we discussed and let me know if I can assist you in the future.   These are the goals we discussed:  Goals       Begin and Stick with Counseling-Depression (pt-stated)      Timeframe:  Long-Range Goal Priority:  High Start Date:       12/17/20                      Expected End Date:  03/08/21                     Follow Up Date 01/20/21    - expect a phone call from [place] to arrange for local in-person counseling and Psychiatrist - keep 90 percent of counseling appointments - schedule counseling appointment    Why is this important?   Beating depression may take some time.  If you don't feel better right away, don't give up on your treatment plan.    Notes: Pt states that the therapists did not show today      Manage Chronic Pain      Timeframe:  Long-Range Goal Priority:  High Start Date:          10/13/2020                   Expected End Date:     04/12/2021                  Follow Up Date 01/07/2021   - call for medicine refill 2 or 3 days before it runs out -  take pain medication as prescribed - plan exercise or activity when pain is best controlled - prioritize tasks for the day- ask for help as needed - track what makes the pain worse and what makes it better - use ice or heat for pain relief  - expect a call from social worker to assist with depression/  anxiety management - try to keep stress to a minimum, get outside in the sunshine everyday - wear a mask as needed and practice good handwashing   Why is this important?   Day-to-day life can be hard when you have chronic pain.  Pain medicine is just one piece of the treatment puzzle.  You can try these action steps to help you manage your pain.    Notes:       Track and Manage My Blood Pressure-Hypertension      Timeframe:  Long-Range Goal Priority:  Medium Start Date:     10/13/2020                        Expected End Date:       04/12/2020                Follow Up Date 01/07/2021   - continue checking blood pressure daily and keep a log - follow a low sodium diet - avoid salty snacks and fast food- read labels for sodium content - take all medications as prescribed   Why is this important?   You won't feel high blood pressure, but it can still hurt your blood vessels.  High blood pressure can cause heart or kidney problems. It can also cause a stroke.  Making lifestyle changes like losing a little weight or eating less salt will help.  Checking your blood pressure at home and at different times of the day can help to control blood pressure.  If the doctor prescribes medicine remember to take it the way the doctor ordered.  Call the office if you cannot afford the medicine or if there are questions about it.     Notes:         This is a list of the screening recommended for you and due dates:  Health Maintenance  Topic Date Due   Zoster (Shingles) Vaccine (1 of 2) Never done   COVID-19 Vaccine (5 - Booster for Moderna series) 10/30/2020   Tetanus Vaccine  09/06/2027   Colon Cancer Screening  05/01/2030   Pneumonia Vaccine  Completed   Flu Shot  Completed   DEXA scan (bone density measurement)  Completed   Hepatitis C Screening: USPSTF Recommendation to screen - Ages 60-79 yo.  Completed   HPV Vaccine  Aged Out

## 2020-12-30 DIAGNOSIS — H15011 Anterior scleritis, right eye: Secondary | ICD-10-CM | POA: Diagnosis not present

## 2021-01-07 ENCOUNTER — Ambulatory Visit (INDEPENDENT_AMBULATORY_CARE_PROVIDER_SITE_OTHER): Payer: Medicare Other | Admitting: *Deleted

## 2021-01-07 DIAGNOSIS — G8929 Other chronic pain: Secondary | ICD-10-CM

## 2021-01-07 DIAGNOSIS — M5440 Lumbago with sciatica, unspecified side: Secondary | ICD-10-CM

## 2021-01-07 DIAGNOSIS — I10 Essential (primary) hypertension: Secondary | ICD-10-CM

## 2021-01-07 NOTE — Chronic Care Management (AMB) (Signed)
Chronic Care Management   CCM RN Visit Note  01/07/2021 Name: Renee Waters MRN: [id number] DOB: 04/04/1945  Subjective: Renee Waters is a 75 y.o. year old female who is a primary care patient of [name], MD. The care management team was consulted for assistance with disease management and care coordination needs.    Engaged with patient by telephone for follow up visit in response to provider referral for case management and/or care coordination services.   Consent to Services:  The patient was given information about Chronic Care Management services, agreed to services, and gave verbal consent prior to initiation of services.  Please see initial visit note for detailed documentation.   Patient agreed to services and verbal consent obtained.   Assessment: Review of patient past medical history, allergies, medications, health status, including review of consultants reports, laboratory and other test data, was performed as part of comprehensive evaluation and provision of chronic care management services.   SDOH (Social Determinants of Health) assessments and interventions performed:    CCM Care Plan  Allergies  Allergen Reactions   Bee Venom Swelling and Hives   Acetaminophen     itches   Amlodipine     Hair loss   Tyloxapol    Other Itching, Rash and Swelling   Oxycodone-Acetaminophen Rash    Pt states, "this gives her a rash, but at home she takes oxycodone for pain relief" Pt states, "this gives her a rash, but at home she takes oxycodone for pain relief"    Outpatient Encounter Medications as of 01/07/2021  Medication Sig Note   acyclovir (ZOVIRAX) 400 MG tablet Take 1 tablet (400 mg total) by mouth 2 (two) times daily. 10/28/2020: Pt reports taking med as needed.    allopurinol (ZYLOPRIM) 300 MG tablet Take 1 tablet (300 mg total) by mouth daily.    ALPRAZolam (XANAX) 0.5 MG tablet Take 1 tablet (0.5 mg total) by mouth at bedtime.    gabapentin (NEURONTIN)  300 MG capsule TAKE 1 CAPSULE BY MOUTH ONCE DAILY FOR  CHRONIC  PAIN (Patient taking differently: TAKE 1 CAPSULE BY MOUTH ONCE DAILY FOR  CHRONIC  PAIN) 10/28/2020: Pt reports taking med as needed when she has herpes breakout.    hydrochlorothiazide (HYDRODIURIL) 25 MG tablet Take 1 tablet by mouth once daily    losartan (COZAAR) 25 MG tablet Take 1 tablet by mouth once daily for blood pressure    Oxycodone HCl 10 MG TABS Take 1 tablet (10 mg total) by mouth 4 (four) times daily. (Patient taking differently: Take 10 mg by mouth 4 (four) times daily as needed (pain).)    pantoprazole (PROTONIX) 40 MG tablet TAKE 1 TABLET BY MOUTH ONCE DAILY BEFORE BREAKFAST    potassium chloride (KLOR-CON) 10 MEQ tablet     temazepam (RESTORIL) 30 MG capsule Take 1 capsule (30 mg total) by mouth at bedtime.    tiZANidine (ZANAFLEX) 4 MG tablet Take 8 mg by mouth 3 (three) times daily as needed (spasms).    valsartan (DIOVAN) 80 MG tablet Take by mouth.    dicyclomine (BENTYL) 10 MG capsule Take 1 capsule (10 mg total) by mouth 2 (two) times daily as needed for spasms. (Patient not taking: Reported on 12/14/2020)    EPINEPHRINE 0.3 mg/0.3 mL IJ SOAJ injection INJECT 0.3 MLS INTO MUSCLE ONCE AS NEEDED (Patient not taking: Reported on 01/07/2021)    Probiotic Product (EQL DAILY PROBIOTIC PO) Take 1 capsule by mouth daily. (Patient not taking: Reported on 12/28/2020)  topiramate (TOPAMAX) 50 MG tablet  (Patient not taking: Reported on 12/28/2020)    No facility-administered encounter medications on file as of 01/07/2021.    Patient Active Problem List   Diagnosis Date Noted   Leukocytosis 11/09/2020   CKD (chronic kidney disease) stage 3, GFR 30-59 ml/min (HCC) 10/29/2020   Dermatomycosis 08/02/2020   HSV-2 (herpes simplex virus 2) infection 08/02/2020   Upper respiratory tract infection 07/09/2020   Rib pain on left side 03/21/2020   Generalized pruritus 03/21/2020   Mildly underweight adult 09/19/2019   MDD  (major depressive disorder), recurrent, in full remission ([place]) 03/27/2019   Educated about COVID-19 virus infection 06/24/2018   MDD (major depressive disorder), recurrent episode, moderate ([place]) 10/28/2016   Thoracic spine pain 08/31/2016   Anemia, normocytic normochromic 05/27/2015   IDA (iron deficiency anemia) 03/02/2015   Unsteady gait 02/25/2015   Mucosal abnormality of esophagus    Systemic lupus ([place]) 10/31/2012   Vitamin D deficiency 03/16/2012   Generalized pain 03/15/2012   Constipation 10/06/2011   Hip pain 06/29/2011   Barrett's esophagus 12/13/2010   Western blot positive HSV2 06/22/2007   Depression 06/22/2007   Essential hypertension 06/22/2007   GERD (gastroesophageal reflux disease) 06/22/2007   DJD (degenerative joint disease) 06/22/2007   NECK PAIN, CHRONIC 06/22/2007   Chronic midline low back pain with sciatica 06/22/2007   Insomnia secondary to depression with anxiety 06/22/2007    Conditions to be addressed/monitored:HTN and Chronic Pain  Care Plan : RN Care Manager plan of care  Updates made by Kassie Mends, RN since 01/07/2021 12:00 AM     Problem: No plan of care established for management of chronic disease states (HTN, Chronic Pain)   Priority: High     Long-Range Goal: Development of plan of care for chronic disease management (HTN, Chronic Pain)   Priority: High  Note:   Current Barriers:  Knowledge Deficits related to plan of care for management of HTN and Chronic Pain  Knowledge Deficits related to self-health management of acute or chronic pain- pt has unmanaged chronic pain (neck, lower back and " all over" DJD). Pt reports she has pain medication which does help.  Patient reports she has "a lot of anxiety and depression can vary".  Patient reports has psych doctor left and she has not found a new doctor, per notes- primary care physician was working on referral for psych, pt reports she is working with CCM LCSW on management of anxiety,  stress, depression. Pt has lost weight, her usual weight is 130 pounds and she reports was down to 105 pounds and is now 116 pounds, reports "doctor told me not to keep drinking the ensure", reports spouse trying to cook healthy meals, pt occasionally gets out in yard, she intends to check on going to a gym for water aerobics.  Patient reports she has chronic insomnia and is on medication and this helps. Chronic Disease Management support and education needs related to chronic pain Care Coordination needs related to anxiety, stress, depression in a patient with chronic pain Chronic Disease Management support and education needs related to pain management, management of stress and anxiety.  Knowledge Deficits related to basic understanding of hypertension pathophysiology and self care management-  patient reports she lives with her spouse, has other relatives she can call on if needed, does check blood pressure daily with readings "mostly normal" with most recent 107/69.  Reports having all medications and taking as prescribed, uses cane at home, has walker if  needed.  Pt reports she continues following up with oncologist for leukemia, pt reports she did receive the flu vaccine, is trying to eat healthy as possible. RNCM Clinical Goal(s):  Patient will verbalize understanding of plan for management of HTN and Chronic Pain as evidenced by patient report, review EHR and  through collaboration with RN Care manager, provider, and care team.   Interventions: 1:1 collaboration with primary care provider regarding development and update of comprehensive plan of care as evidenced by provider attestation and co-signature Inter-disciplinary care team collaboration (see longitudinal plan of care) Evaluation of current treatment plan related to  self management and patient's adherence to plan as established by provider   Hypertension Interventions:  (Status:  Goal on track:  Yes.) Long Term Goal Last practice  recorded BP readings:  BP Readings from Last 3 Encounters:  12/14/20 108/71  11/30/20 92/62  11/18/20 (!) 162/72  Most recent eGFR/CrCl:  Lab Results  Component Value Date   EGFR 35 (L) 10/21/2020    No components found for: CRCL  Provided education to patient re: stroke prevention, s/s of heart attack and stroke Reviewed medications with patient and discussed importance of compliance Advised patient, providing education and rationale, to monitor blood pressure daily and record, calling PCP for findings outside established parameters Discussed complications of poorly controlled blood pressure such as heart disease, stroke, circulatory complications, vision complications, kidney impairment, sexual dysfunction Reinforced low sodium diet  Pain Interventions:  (Status:  Goal on track:  Yes.) Long Term Goal Pain assessment performed Medications reviewed Reviewed provider established plan for pain management Discussed importance of adherence to all scheduled medical appointments Counseled on the importance of reporting any/all new or changed pain symptoms or management strategies to pain management provider Reviewed with patient prescribed pharmacological and nonpharmacological pain relief strategies  Patient Goals/Self-Care Activities: Take all medications as prescribed Attend all scheduled provider appointments Call provider office for new concerns or questions  Work with the social worker to address care coordination needs and will continue to work with the clinical team to address health care and disease management related needs check blood pressure daily keep a blood pressure log take blood pressure log to all doctor appointments keep all doctor appointments report new symptoms to your doctor Try to get outside in the sunshine daily Try doing some type of exercise daily  Follow Up Plan:  Telephone follow up appointment with care management team member scheduled for:   02/25/2021      Plan:Telephone follow up appointment with care management team member scheduled for:  02/25/2021   Jacqlyn Larsen [facility], BSN RN Case Manager [facility] [phone]

## 2021-01-07 NOTE — Patient Instructions (Signed)
Visit Information  Thank you for taking time to visit with me today. Please don't hesitate to contact me if I can be of assistance to you before our next scheduled telephone appointment.  Following are the goals we discussed today:  Take all medications as prescribed Attend all scheduled provider appointments Call provider office for new concerns or questions  Work with the social worker to address care coordination needs and will continue to work with the clinical team to address health care and disease management related needs check blood pressure daily keep a blood pressure log take blood pressure log to all doctor appointments keep all doctor appointments report new symptoms to your doctor Try to get outside in the sunshine daily Try doing some type of exercise daily  Our next appointment is by telephone on 02/25/2021 at 330 pm  Please call the care guide team at [phone] if you need to cancel or reschedule your appointment.   If you are experiencing a Mental Health or [place] or need someone to talk to, please call the [place] National Suicide Prevention Lifeline: 440 325 2682 or TTY: 331-735-4209 TTY (603)672-6586) to talk to a trained counselor call 1-800-273-TALK (toll free, 24 hour hotline) go to [facility] [address] [place] [phone]) call the [place]: [phone] call 911   Patient verbalizes understanding of instructions provided today and agrees to view in [place].   Jacqlyn Larsen [facility], BSN RN Case Manager [facility] [phone]

## 2021-01-11 ENCOUNTER — Other Ambulatory Visit: Payer: Self-pay | Admitting: Family Medicine

## 2021-01-11 NOTE — Progress Notes (Signed)
[place], [place] [address]   CLINIC:  Medical Oncology/Hematology  PCP:  [name], MD [address], [place] / [place] [address]  [phone]  REASON FOR VISIT:  Follow-up for leukocytosis  PRIOR THERAPY: none  CURRENT THERAPY: under work-up  INTERVAL HISTORY:  [name], a 75 y.o. female, returns for routine follow-up for her leukocytosis. Ever was last seen on 12/14/2020.  Today she reports feeling poorly. She reports a fall which occurred when she tried to stand. She reports pain in her hips, knees, and spine.   REVIEW OF SYSTEMS:  Review of Systems  Constitutional:  Positive for fatigue (depleted). Negative for appetite change (80%).  Respiratory:  Positive for cough.   Gastrointestinal:  Positive for abdominal pain (upper right), constipation, nausea and vomiting.  Musculoskeletal:  Positive for arthralgias (10/10 hips and knees), back pain and gait problem (off balance).  Neurological:  Positive for dizziness, gait problem (off balance) and headaches.  All other systems reviewed and are negative.  PAST MEDICAL/SURGICAL HISTORY:  Past Medical History:  Diagnosis Date   Acute cholangitis    Allergy    Anemia    Anxiety    Arthritis    Phreesia 03/18/2020   Barrett's esophagus    Cataract    Chronic back pain    Chronic neck pain    CKD (chronic kidney disease) stage 3, GFR 30-59 ml/min ([place]) 10/29/2020   Depression    Genital herpes    GERD (gastroesophageal reflux disease)    H/O degenerative disc disease    History of blood transfusion    Hypertension    Insomnia    Lupus (systemic lupus erythematosus) ([facility])    Neuromuscular disorder ([facility])    Osteoarthritis    S/P colonoscopy June 2005   normal, no polyps   S/P endoscopy June 2005, Oct 2009   2005: short-segment Barrett's, 2009: short-segment Barrett's   UTI (lower urinary tract infection) 11/2012   Past Surgical History:   Procedure Laterality Date   ABDOMINAL HYSTERECTOMY     BACK SURGERY     BIOPSY N/A 03/20/2014   Procedure: BIOPSY;  Surgeon: Daneil Dolin, MD;  Location: [facility];  Service: Endoscopy;  Laterality: N/A;   BIOPSY  09/14/2015   Procedure: BIOPSY;  Surgeon: Daneil Dolin, MD;  Location[facility];  Service: Endoscopy;;  esophageal and gastric   BIOPSY  07/23/2019   Procedure: BIOPSY;  Surgeon: Daneil Dolin, MD;  [facility];  Service: Endoscopy;;  esophageal    CARPAL TUNNEL RELEASE Left 2013   cervical disectomy  2002   CESAREAN SECTION N/A    Phreesia 03/18/2020   CHOLECYSTECTOMY     with lysis of adhesions for sbo; "ruptured gallbladder".   COLONOSCOPY  11/09/2011   RMR: Melanosis coli   COLONOSCOPY WITH PROPOFOL N/A 09/14/2015   Dr. Gala Romney: diverticulosis, 76m TA removed. next TCS 09/2020.    COLONOSCOPY WITH PROPOFOL N/A 04/30/2020   Procedure: COLONOSCOPY WITH PROPOFOL;  Surgeon: RDaneil Dolin MD;  [facility];  Service: Endoscopy;  Laterality: N/A;  PM (ASA 3)   DENTAL SURGERY  11/2015   multiple tooth extraction   ESOPHAGOGASTRODUODENOSCOPY  11/29/2007   salmon-colored  tongue   longest stable at  3 cm, distal esophagus as described previously status post biopsy/ Hiatal hernia, otherwise normal stomach D1 and D2   ESOPHAGOGASTRODUODENOSCOPY  01/06/11   short segment Barrett's esophagus  s/p bx/Hiatal hernia   ESOPHAGOGASTRODUODENOSCOPY (EGD) WITH PROPOFOL N/A 03/20/2014   GEX:BMWUXLKG distal esophagus short segment barrett's, bx with no dysplasia. next egd in 03/2017   ESOPHAGOGASTRODUODENOSCOPY (EGD) WITH PROPOFOL N/A 09/14/2015   Dr. Gala Romney: Barrett's without dysplasia, gastritis benign bx, hiatal hernia. next EGD 09/2018.   ESOPHAGOGASTRODUODENOSCOPY (EGD) WITH PROPOFOL N/A 07/23/2019   Procedure: ESOPHAGOGASTRODUODENOSCOPY (EGD) WITH PROPOFOL;  Surgeon: Daneil Dolin, MD;  [facility];  Service: Endoscopy;  Laterality: N/A;  3:00pm   EYE  SURGERY N/A    Phreesia 03/18/2020   HERNIA REPAIR Right 07/2010   Dr. Zada Girt   JOINT REPLACEMENT     LAPAROSCOPIC CHOLECYSTECTOMY  2017   at [facility]   POLYPECTOMY  09/14/2015   Procedure: POLYPECTOMY;  Surgeon: Daneil Dolin, MD;  [facility];  Service: Endoscopy;;  ascending colon   right hip replacement  07/2010   went back in sept 2012 to fix   SHOULDER ARTHROSCOPY  2008   left   SPINE SURGERY N/A    Phreesia 03/18/2020   TOTAL HIP REVISION Right 12/17/2012   Procedure: RIGHT TOTAL HIP REVISION;  Surgeon: Mauri Pole, MD;  Location: [facility];  Service: Orthopedics;  Laterality: Right;   WRIST SURGERY Right 2011   open reduction right wrist.    SOCIAL HISTORY:  Social History   Socioeconomic History   Marital status: Married    Spouse name: louis   Number of children: 4   Years of education: 12+   Highest education level: Some college, no degree  Occupational History   Occupation: Press photographer  - retired   Occupation: non profit  Tobacco Use   Smoking status: Former    Packs/day: 0.25    Years: 25.00    Pack years: 6.25    Types: Cigarettes    Quit date: 02/07/2003    Years since quitting: 17.9   Smokeless tobacco: Never   Tobacco comments:    quit in 2004  Vaping Use   Vaping Use: Never used  Substance and Sexual Activity   Alcohol use: No   Drug use: No   Sexual activity: Not Currently    Birth control/protection: Surgical  Other Topics Concern   Not on file  Social History Narrative   Not on file   Social Determinants of Health   Financial Resource Strain: Low Risk    Difficulty of Paying Living Expenses: Not hard at all  Food Insecurity: No Food Insecurity   Worried About Charity fundraiser in the Last Year: Never true   [place] in the Last Year: Never true  Transportation Needs: No Transportation Needs   Lack of Transportation (Medical): No   Lack of Transportation (Non-Medical): No  Physical Activity: Insufficiently Active    Days of Exercise per Week: 6 days   Minutes of Exercise per Session: 20 min  Stress: Stress Concern Present   Feeling of Stress : Very much  Social Connections: Moderately Integrated   Frequency of Communication with Friends and Family: More than three times a week   Frequency of Social Gatherings with Friends and Family: Once a week   Attends Religious Services: More than 4 times per year   Active Member of [employer] or Organizations: No   Attends Archivist Meetings: Never   Marital Status: Married  Human resources officer Violence: Not At Risk   Fear of Current or Ex-Partner: No   Emotionally Abused: No   Physically Abused: No  Sexually Abused: No    FAMILY HISTORY:  Family History  Problem Relation Age of Onset   Hypertension Mother    Stroke Mother    Colon cancer Neg Hx    Anesthesia problems Neg Hx    Hypotension Neg Hx    Malignant hyperthermia Neg Hx    Pseudochol deficiency Neg Hx    Gastric cancer Neg Hx    Esophageal cancer Neg Hx     CURRENT MEDICATIONS:  Current Outpatient Medications  Medication Sig Dispense Refill   acyclovir (ZOVIRAX) 400 MG tablet Take 1 tablet (400 mg total) by mouth 2 (two) times daily. 60 tablet 3   allopurinol (ZYLOPRIM) 300 MG tablet Take 1 tablet (300 mg total) by mouth daily. 30 tablet 3   ALPRAZolam (XANAX) 0.5 MG tablet Take 1 tablet (0.5 mg total) by mouth at bedtime. 30 tablet 5   dicyclomine (BENTYL) 10 MG capsule Take 1 capsule (10 mg total) by mouth 2 (two) times daily as needed for spasms. 30 capsule 0   EPINEPHRINE 0.3 mg/0.3 mL IJ SOAJ injection INJECT 0.3 MLS INTO MUSCLE ONCE AS NEEDED 1 Device 2   gabapentin (NEURONTIN) 300 MG capsule TAKE 1 CAPSULE BY MOUTH ONCE DAILY FOR  CHRONIC  PAIN (Patient taking differently: TAKE 1 CAPSULE BY MOUTH ONCE DAILY FOR  CHRONIC  PAIN) 90 capsule 0   hydrochlorothiazide (HYDRODIURIL) 25 MG tablet Take 1 tablet by mouth once daily 90 tablet 0   losartan (COZAAR) 25 MG tablet Take 1  tablet by mouth once daily for blood pressure 30 tablet 0   omeprazole (PRILOSEC) 40 MG capsule Take by mouth daily.     Oxycodone HCl 10 MG TABS Take 1 tablet (10 mg total) by mouth 4 (four) times daily. (Patient taking differently: Take 10 mg by mouth 4 (four) times daily as needed (pain).) 56 tablet 0   pantoprazole (PROTONIX) 40 MG tablet TAKE 1 TABLET BY MOUTH ONCE DAILY BEFORE BREAKFAST 90 tablet 3   potassium chloride (KLOR-CON) 10 MEQ tablet      prednisoLONE acetate (PRED FORTE) 1 % ophthalmic suspension Place into the right eye.     Probiotic Product (EQL DAILY PROBIOTIC PO) Take 1 capsule by mouth daily.     temazepam (RESTORIL) 30 MG capsule Take 1 capsule (30 mg total) by mouth at bedtime. 30 capsule 0   tiZANidine (ZANAFLEX) 4 MG tablet Take 8 mg by mouth 3 (three) times daily as needed (spasms). 180 tablet 0   topiramate (TOPAMAX) 50 MG tablet      valsartan (DIOVAN) 80 MG tablet Take by mouth.     No current facility-administered medications for this visit.    ALLERGIES:  Allergies  Allergen Reactions   Bee Venom Swelling and Hives   Acetaminophen     itches   Amlodipine     Hair loss   Tyloxapol    Other Itching, Rash and Swelling   Oxycodone-Acetaminophen Rash    Pt states, "this gives her a rash, but at home she takes oxycodone for pain relief" Pt states, "this gives her a rash, but at home she takes oxycodone for pain relief"    PHYSICAL EXAM:  Performance status (ECOG): 1 - Symptomatic but completely ambulatory  Vitals:   01/12/21 1046  BP: (!) 122/91  Pulse: 89  Resp: 18  Temp: 99 F (37.2 C)  SpO2: 100%   Wt Readings from Last 3 Encounters:  01/12/21 115 lb 12.8 oz (52.5 kg)  12/14/20 117 lb (  53.1 kg)  11/30/20 116 lb 14.4 oz (53 kg)   Physical Exam Vitals reviewed.  Constitutional:      Appearance: Normal appearance.  Cardiovascular:     Rate and Rhythm: Normal rate and regular rhythm.     Pulses: Normal pulses.     Heart sounds: Normal  heart sounds.  Pulmonary:     Effort: Pulmonary effort is normal.     Breath sounds: Normal breath sounds.  Neurological:     General: No focal deficit present.     Mental Status: She is alert and oriented to person, place, and time.  Psychiatric:        Mood and Affect: Mood normal.        Behavior: Behavior normal.    LABORATORY DATA:  I have reviewed the labs as listed.  CBC Latest Ref Rng & Units 11/18/2020 11/09/2020 11/06/2020  WBC 4.0 - 10.5 K/uL 81.2(HH) 82.0(HH) 75.6(HH)  Hemoglobin 12.0 - 15.0 g/dL 9.3(L) 10.0(L) 10.1(L)  Hematocrit 36.0 - 46.0 % 27.9(L) 31.4(L) 30.9(L)  Platelets 150 - 400 K/uL 237 355 296   CMP Latest Ref Rng & Units 11/06/2020 10/21/2020 10/05/2020  Glucose 70 - 99 mg/dL 79 65 85  BUN 8 - 23 mg/dL _0 Creatinine 0.44 - 1.00 mg/dL 1.37(H) 1.56(H) 1.39(H)  Sodium 135 - 145 mmol/L 140 140 141  Potassium 3.5 - 5.1 mmol/L 4.2 4.1 4.1  Chloride 98 - 111 mmol/L 107 102 100  CO2 22 - 32 mmol/L 25 19(L) 21  Calcium 8.9 - 10.3 mg/dL 8.7(L) 9.1 8.1(L)  Total Protein 6.5 - 8.1 g/dL 8.6(H) 7.8 7.9  Total Bilirubin 0.3 - 1.2 mg/dL 0.7 0.3 0.4  Alkaline Phos 38 - 126 U/L 186(H) 194(H) 196(H)  AST 15 - 41 U/L 33 41(H) 39  ALT 0 - 44 U/L _1 Component Value Date/Time   RBC 2.89 (L) 11/18/2020 0955   MCV 96.5 11/18/2020 0955   MCV 91 05/27/2020 1632   MCH 32.2 11/18/2020 0955   MCHC 33.3 11/18/2020 0955   RDW 19.2 (H) 11/18/2020 0955   RDW 14.1 05/27/2020 1632   LYMPHSABS 4.9 (H) 11/18/2020 0955   LYMPHSABS 1.7 05/27/2020 1632   MONOABS 13.8 (H) 11/18/2020 0955   EOSABS 0.8 (H) 11/18/2020 0955   EOSABS 0.2 05/27/2020 1632   BASOSABS 0.0 11/18/2020 0955   BASOSABS 0.0 05/27/2020 1632    DIAGNOSTIC IMAGING:  I have independently reviewed the scans and discussed with the patient. US SPLEEN (ABDOMEN LIMITED)  Result Date: 12/16/2020 CLINICAL DATA:  MDS Leukocytosis Leukemia Abdominal tenderness EXAM: ULTRASOUND ABDOMEN LIMITED COMPARISON:   None. FINDINGS: Sonographic evaluation of the spleen was performed. The spleen is mildly enlarged measuring 12.4 x 9.6 x 7.0 cm with a volume of 435 cc. IMPRESSION: Mild splenomegaly. Electronically Signed   By: Miachel Roux M.D.   On: 12/16/2020 16:13     ASSESSMENT:  Leukocytosis with left shift: - CBC on 11/06/2020 with white count 75.6, differential 59% neutrophils, 12% monocytes, 4% lymphocytes, 1 percentage of eosinophils and basophils, 6% band neutrophils, 12% myelocytes, 1% promyelocytes, 29% blasts. - Pathologist review of blood smear reported as leukoerythroblastic reaction. - 25 pound weight loss in the last 6 months, unintentional.  Decreased appetite.  Reports fatigue for the last few months.  Reports night sweats x3 in the last 1 month. - Bone marrow biopsy on 11/18/2020 consistent with granulocytic proliferation with differential including CMML versus myeloproliferative disorder. - BCR/ABL  was negative. 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 - NGS myeloid panel shows mutations in ASXL1, MPL, TET2, EZH2 mutations.  2.  Social/family history: - She lives at home and is able to do all her ADLs and IADLs although she is getting tired lately.  She reports quitting smoking 6 months ago and smoked 1 pack/week for 43 years. - She believes that her mother had some kind of leukemia.  No other malignancies.   PLAN:  CMML/MPN: - We reviewed results of JAK2 V6 1 27F mutation testing which revealed positive MPL mutation. - We also discussed NGS panel which showed AS XL 1 and TET 2 mutations suggestive of CMML. - For completion sake, will also check FGFR1 and PDGFRB mutations. - CBC today shows white count 103.  Hemoglobin 8.5.  Platelet count 327.  Differential predominantly neutrophils, lymphocytes, monocytes and eosinophils. - Would be reluctant to start her on Hydrea because of worsening of anemia. - Also discussed treatment with hypomethylating agents including  azacitidine. - Recommend consultation with hematopathology and leukemia doctors (Drs. Powell/P[place]ee) for clinical trial options as well as treatment recommendations. - RTC after consultation at tertiary center.  2.  Right upper quadrant tenderness: - Right upper quadrant ultrasound was benign.  3.  CKD: -Baseline creatinine between 1.3-1.5.  Continue follow-up with Dr. Theador Hawthorne. - Serum lysozyme level was normal at 10.5.  4.  Hyperuricemia: - Continue allopurinol 300 mg daily.  Orders placed this encounter:  No orders of the defined types were placed in this encounter.    Derek Jack, MD [facility] [phone]   I, Thana Ates, am acting as a scribe for Dr. Derek Jack.  I, Derek Jack MD, have reviewed the above documentation for accuracy and completeness, and I agree with the above.

## 2021-01-12 ENCOUNTER — Other Ambulatory Visit: Payer: Self-pay

## 2021-01-12 ENCOUNTER — Inpatient Hospital Stay (HOSPITAL_COMMUNITY): Payer: Medicare Other

## 2021-01-12 ENCOUNTER — Inpatient Hospital Stay (HOSPITAL_COMMUNITY): Payer: Medicare Other | Attending: Hematology | Admitting: Hematology

## 2021-01-12 VITALS — BP 122/91 | HR 89 | Temp 99.0°F | Resp 18 | Ht 65.0 in | Wt 115.8 lb

## 2021-01-12 DIAGNOSIS — K219 Gastro-esophageal reflux disease without esophagitis: Secondary | ICD-10-CM | POA: Insufficient documentation

## 2021-01-12 DIAGNOSIS — M549 Dorsalgia, unspecified: Secondary | ICD-10-CM | POA: Insufficient documentation

## 2021-01-12 DIAGNOSIS — Z9181 History of falling: Secondary | ICD-10-CM | POA: Diagnosis not present

## 2021-01-12 DIAGNOSIS — M1990 Unspecified osteoarthritis, unspecified site: Secondary | ICD-10-CM | POA: Insufficient documentation

## 2021-01-12 DIAGNOSIS — I129 Hypertensive chronic kidney disease with stage 1 through stage 4 chronic kidney disease, or unspecified chronic kidney disease: Secondary | ICD-10-CM | POA: Insufficient documentation

## 2021-01-12 DIAGNOSIS — M25562 Pain in left knee: Secondary | ICD-10-CM | POA: Diagnosis not present

## 2021-01-12 DIAGNOSIS — M329 Systemic lupus erythematosus, unspecified: Secondary | ICD-10-CM | POA: Insufficient documentation

## 2021-01-12 DIAGNOSIS — G709 Myoneural disorder, unspecified: Secondary | ICD-10-CM | POA: Diagnosis not present

## 2021-01-12 DIAGNOSIS — F32A Depression, unspecified: Secondary | ICD-10-CM | POA: Diagnosis not present

## 2021-01-12 DIAGNOSIS — D72829 Elevated white blood cell count, unspecified: Secondary | ICD-10-CM | POA: Diagnosis not present

## 2021-01-12 DIAGNOSIS — M25551 Pain in right hip: Secondary | ICD-10-CM | POA: Diagnosis not present

## 2021-01-12 DIAGNOSIS — N183 Chronic kidney disease, stage 3 unspecified: Secondary | ICD-10-CM | POA: Insufficient documentation

## 2021-01-12 DIAGNOSIS — R5383 Other fatigue: Secondary | ICD-10-CM | POA: Diagnosis not present

## 2021-01-12 DIAGNOSIS — F419 Anxiety disorder, unspecified: Secondary | ICD-10-CM | POA: Insufficient documentation

## 2021-01-12 DIAGNOSIS — M25561 Pain in right knee: Secondary | ICD-10-CM | POA: Diagnosis not present

## 2021-01-12 DIAGNOSIS — M25552 Pain in left hip: Secondary | ICD-10-CM | POA: Insufficient documentation

## 2021-01-12 DIAGNOSIS — R10811 Right upper quadrant abdominal tenderness: Secondary | ICD-10-CM

## 2021-01-12 LAB — CBC WITH DIFFERENTIAL/PLATELET
Band Neutrophils: 2 %
Basophils Absolute: 0 10*3/uL (ref 0.0–0.1)
Basophils Relative: 0 %
Eosinophils Absolute: 10.4 10*3/uL — ABNORMAL HIGH (ref 0.0–0.5)
Eosinophils Relative: 10 %
HCT: 25.8 % — ABNORMAL LOW (ref 36.0–46.0)
Hemoglobin: 8.5 g/dL — ABNORMAL LOW (ref 12.0–15.0)
Lymphocytes Relative: 6 %
Lymphs Abs: 6.2 10*3/uL — ABNORMAL HIGH (ref 0.7–4.0)
MCH: 32.9 pg (ref 26.0–34.0)
MCHC: 32.9 g/dL (ref 30.0–36.0)
MCV: 100 fL (ref 80.0–100.0)
Metamyelocytes Relative: 6 %
Monocytes Absolute: 10.4 10*3/uL — ABNORMAL HIGH (ref 0.1–1.0)
Monocytes Relative: 10 %
Myelocytes: 2 %
Neutro Abs: 68.4 10*3/uL — ABNORMAL HIGH (ref 1.7–7.7)
Neutrophils Relative %: 64 %
Platelets: 327 10*3/uL (ref 150–400)
RBC: 2.58 MIL/uL — ABNORMAL LOW (ref 3.87–5.11)
RDW: 22.6 % — ABNORMAL HIGH (ref 11.5–15.5)
WBC: 103.7 10*3/uL (ref 4.0–10.5)
nRBC: 1.8 % — ABNORMAL HIGH (ref 0.0–0.2)

## 2021-01-12 LAB — RETICULOCYTES
Immature Retic Fract: 38.8 % — ABNORMAL HIGH (ref 2.3–15.9)
RBC.: 2.62 MIL/uL — ABNORMAL LOW (ref 3.87–5.11)
Retic Count, Absolute: 69.4 10*3/uL (ref 19.0–186.0)
Retic Ct Pct: 2.7 % (ref 0.4–3.1)

## 2021-01-12 LAB — LACTATE DEHYDROGENASE: LDH: 513 U/L — ABNORMAL HIGH (ref 98–192)

## 2021-01-12 NOTE — Patient Instructions (Signed)
[place] at [facility] Discharge Instructions  You were seen and examined today by [name]. He reviewed your most recent test results and it is showing that your bone marrow is producing too many white blood cells. The condition that you have is known as CMML (Chronic Myelomonocytic Leukemia). This is a type of MDS (Myelodysplastic Syndrome). [name] recommends you being seen at [facility] to see if they have a drug trial available to treat this condition. If they do not have a drug trial we can treat you here at the [facility] with [place]. This medication is given intravenously every 28 days here. If you have any questions you can reach out to [place]. Please keep follow up as scheduled.    Thank you for choosing [place] at [facility] to provide your oncology and hematology care.  To afford each patient quality time with our provider, please arrive at least 15 minutes before your scheduled appointment time.   If you have a lab appointment with the [facility] please come in thru the Main Entrance and check in at the main information desk.  You need to re-schedule your appointment should you arrive 10 or more minutes late.  We strive to give you quality time with our providers, and arriving late affects you and other patients whose appointments are after yours.  Also, if you no show three or more times for appointments you may be dismissed from the clinic at the providers discretion.     Again, thank you for choosing [facility].  Our hope is that these requests will decrease the amount of time that you wait before being seen by our physicians.       _____________________________________________________________  Should you have questions after your visit to [facility], please contact our office at [phone] and follow the prompts.  Our office hours are 8:00 a.m. and 4:30 p.m. Monday - Friday.  Please  note that voicemails left after 4:00 p.m. may not be returned until the following business day.  We are closed weekends and major holidays.  You do have access to a nurse 24-7, just call the main number to the clinic [phone] and do not press any options, hold on the line and a nurse will answer the phone.    For prescription refill requests, have your pharmacy contact our office and allow 72 hours.    Due to Covid, you will need to wear a mask upon entering the hospital. If you do not have a mask, a mask will be given to you at the Main Entrance upon arrival. For doctor visits, patients may have 1 support person age 20 or older with them. For treatment visits, patients can not have anyone with them due to social distancing guidelines and our immunocompromised population.

## 2021-01-15 LAB — MISC LABCORP TEST (SEND OUT): Labcorp test code: 511444

## 2021-01-20 ENCOUNTER — Ambulatory Visit: Payer: Medicare Other | Admitting: *Deleted

## 2021-01-20 DIAGNOSIS — I10 Essential (primary) hypertension: Secondary | ICD-10-CM

## 2021-01-20 DIAGNOSIS — M159 Polyosteoarthritis, unspecified: Secondary | ICD-10-CM

## 2021-01-20 DIAGNOSIS — F331 Major depressive disorder, recurrent, moderate: Secondary | ICD-10-CM

## 2021-01-20 DIAGNOSIS — R52 Pain, unspecified: Secondary | ICD-10-CM

## 2021-01-21 ENCOUNTER — Ambulatory Visit (INDEPENDENT_AMBULATORY_CARE_PROVIDER_SITE_OTHER): Payer: Medicare Other | Admitting: Family Medicine

## 2021-01-21 ENCOUNTER — Other Ambulatory Visit: Payer: Self-pay

## 2021-01-21 ENCOUNTER — Other Ambulatory Visit (HOSPITAL_COMMUNITY)
Admission: RE | Admit: 2021-01-21 | Discharge: 2021-01-21 | Disposition: A | Discharge status: Home / Self Care | Payer: Medicare Other | Source: Ambulatory Visit | Attending: Family Medicine | Admitting: Family Medicine

## 2021-01-21 ENCOUNTER — Telehealth: Payer: Self-pay

## 2021-01-21 ENCOUNTER — Encounter: Payer: Self-pay | Admitting: Family Medicine

## 2021-01-21 VITALS — BP 154/80 | HR 94 | Temp 100.2°F | Resp 17 | Ht 65.0 in | Wt 119.1 lb

## 2021-01-21 DIAGNOSIS — R6889 Other general symptoms and signs: Secondary | ICD-10-CM | POA: Diagnosis not present

## 2021-01-21 DIAGNOSIS — I10 Essential (primary) hypertension: Secondary | ICD-10-CM | POA: Diagnosis not present

## 2021-01-21 DIAGNOSIS — R509 Fever, unspecified: Secondary | ICD-10-CM

## 2021-01-21 DIAGNOSIS — J029 Acute pharyngitis, unspecified: Secondary | ICD-10-CM | POA: Insufficient documentation

## 2021-01-21 LAB — CBC WITH DIFFERENTIAL/PLATELET
Band Neutrophils: 9 %
Basophils Absolute: 0 10*3/uL (ref 0.0–0.1)
Basophils Relative: 0 %
Eosinophils Absolute: 2 10*3/uL — ABNORMAL HIGH (ref 0.0–0.5)
Eosinophils Relative: 2 %
HCT: 26.9 % — ABNORMAL LOW (ref 36.0–46.0)
Hemoglobin: 8.6 g/dL — ABNORMAL LOW (ref 12.0–15.0)
Lymphocytes Relative: 6 %
Lymphs Abs: 6.1 10*3/uL — ABNORMAL HIGH (ref 0.7–4.0)
MCH: 31.5 pg (ref 26.0–34.0)
MCHC: 32 g/dL (ref 30.0–36.0)
MCV: 98.5 fL (ref 80.0–100.0)
Metamyelocytes Relative: 5 %
Monocytes Absolute: 9.2 10*3/uL — ABNORMAL HIGH (ref 0.1–1.0)
Monocytes Relative: 9 %
Myelocytes: 6 %
Neutro Abs: 73.3 10*3/uL — ABNORMAL HIGH (ref 1.7–7.7)
Neutrophils Relative %: 63 %
Platelets: 341 10*3/uL (ref 150–400)
RBC: 2.73 MIL/uL — ABNORMAL LOW (ref 3.87–5.11)
RDW: 22.7 % — ABNORMAL HIGH (ref 11.5–15.5)
WBC: 101.8 10*3/uL (ref 4.0–10.5)
nRBC: 3.1 % — ABNORMAL HIGH (ref 0.0–0.2)

## 2021-01-21 LAB — COMPREHENSIVE METABOLIC PANEL
ALT: 15 U/L (ref 0–44)
AST: 35 U/L (ref 15–41)
Albumin: 4 g/dL (ref 3.5–5.0)
Alkaline Phosphatase: 173 U/L — ABNORMAL HIGH (ref 38–126)
Anion gap: 11 (ref 5–15)
BUN: 25 mg/dL — ABNORMAL HIGH (ref 8–23)
CO2: 24 mmol/L (ref 22–32)
Calcium: 8.7 mg/dL — ABNORMAL LOW (ref 8.9–10.3)
Chloride: 105 mmol/L (ref 98–111)
Creatinine, Ser: 1.55 mg/dL — ABNORMAL HIGH (ref 0.44–1.00)
GFR, Estimated: 35 mL/min — ABNORMAL LOW (ref >60)
Glucose, Bld: 114 mg/dL — ABNORMAL HIGH (ref 70–99)
Potassium: 3.5 mmol/L (ref 3.5–5.1)
Sodium: 140 mmol/L (ref 135–145)
Total Bilirubin: 0.6 mg/dL (ref 0.3–1.2)
Total Protein: 8.6 g/dL — ABNORMAL HIGH (ref 6.5–8.1)

## 2021-01-21 LAB — POCT INFLUENZA A/B
Influenza A, POC: NEGATIVE
Influenza B, POC: NEGATIVE

## 2021-01-21 LAB — POCT RAPID STREP A (OFFICE): Rapid Strep A Screen: NEGATIVE

## 2021-01-21 NOTE — Chronic Care Management (AMB) (Signed)
Chronic Care Management    Clinical Social Work Note  01/21/2021 Name: Renee Waters MRN: [id number] DOB: October 22, 1945  Renee Waters is a 75 y.o. year old female who is a primary care patient of [name], MD. The CCM team was consulted to assist the patient with chronic disease management and/or care coordination needs related to: [place] and [facility].   Engaged with patient by telephone for follow up visit in response to provider referral for social work chronic care management and care coordination services.   Consent to Services:  The patient was given information about Chronic Care Management services, agreed to services, and gave verbal consent prior to initiation of services.  Please see initial visit note for detailed documentation.   Patient agreed to services and consent obtained.   Assessment: Review of patient past medical history, allergies, medications, and health status, including review of relevant consultants reports was performed today as part of a comprehensive evaluation and provision of chronic care management and care coordination services.     SDOH (Social Determinants of Health) assessments and interventions performed:    Advanced Directives Status: Not addressed in this encounter.  CCM Care Plan  Allergies  Allergen Reactions   Bee Venom Swelling and Hives   Acetaminophen     itches   Amlodipine     Hair loss   Tyloxapol    Other Itching, Rash and Swelling   Oxycodone-Acetaminophen Rash    Pt states, "this gives her a rash, but at home she takes oxycodone for pain relief" Pt states, "this gives her a rash, but at home she takes oxycodone for pain relief"    Outpatient Encounter Medications as of 01/20/2021  Medication Sig Note   acyclovir (ZOVIRAX) 400 MG tablet Take 1 tablet (400 mg total) by mouth 2 (two) times daily. 10/28/2020: Pt reports taking med as needed.    allopurinol (ZYLOPRIM) 300 MG tablet Take 1 tablet (300  mg total) by mouth daily.    ALPRAZolam (XANAX) 0.5 MG tablet Take 1 tablet (0.5 mg total) by mouth at bedtime.    dicyclomine (BENTYL) 10 MG capsule Take 1 capsule (10 mg total) by mouth 2 (two) times daily as needed for spasms.    EPINEPHRINE 0.3 mg/0.3 mL IJ SOAJ injection INJECT 0.3 MLS INTO MUSCLE ONCE AS NEEDED    gabapentin (NEURONTIN) 300 MG capsule TAKE 1 CAPSULE BY MOUTH ONCE DAILY FOR  CHRONIC  PAIN (Patient taking differently: TAKE 1 CAPSULE BY MOUTH ONCE DAILY FOR  CHRONIC  PAIN) 10/28/2020: Pt reports taking med as needed when she has herpes breakout.    hydrochlorothiazide (HYDRODIURIL) 25 MG tablet Take 1 tablet by mouth once daily    losartan (COZAAR) 25 MG tablet Take 1 tablet by mouth once daily for blood pressure    omeprazole (PRILOSEC) 40 MG capsule Take by mouth daily.    Oxycodone HCl 10 MG TABS Take 1 tablet (10 mg total) by mouth 4 (four) times daily. (Patient taking differently: Take 10 mg by mouth 4 (four) times daily as needed (pain).)    pantoprazole (PROTONIX) 40 MG tablet TAKE 1 TABLET BY MOUTH ONCE DAILY BEFORE BREAKFAST    potassium chloride (KLOR-CON) 10 MEQ tablet     prednisoLONE acetate (PRED FORTE) 1 % ophthalmic suspension Place into the right eye. (Patient not taking: Reported on 01/21/2021)    Probiotic Product (EQL DAILY PROBIOTIC PO) Take 1 capsule by mouth daily. (Patient not taking: Reported on 01/21/2021)    temazepam (  RESTORIL) 30 MG capsule Take 1 capsule (30 mg total) by mouth at bedtime.    tiZANidine (ZANAFLEX) 4 MG tablet Take 8 mg by mouth 3 (three) times daily as needed (spasms).    topiramate (TOPAMAX) 50 MG tablet     valsartan (DIOVAN) 80 MG tablet Take by mouth.    No facility-administered encounter medications on file as of 01/20/2021.    Patient Active Problem List   Diagnosis Date Noted   Fever 01/21/2021   Leukocytosis 11/09/2020   CKD (chronic kidney disease) stage 3, GFR 30-59 ml/min (HCC) 10/29/2020   Dermatomycosis 08/02/2020    HSV-2 (herpes simplex virus 2) infection 08/02/2020   Upper respiratory tract infection 07/09/2020   Rib pain on left side 03/21/2020   Generalized pruritus 03/21/2020   Mildly underweight adult 09/19/2019   MDD (major depressive disorder), recurrent, in full remission ([place]) 03/27/2019   Educated about COVID-19 virus infection 06/24/2018   MDD (major depressive disorder), recurrent episode, moderate ([place]) 10/28/2016   Thoracic spine pain 08/31/2016   Anemia, normocytic normochromic 05/27/2015   IDA (iron deficiency anemia) 03/02/2015   Unsteady gait 02/25/2015   Mucosal abnormality of esophagus    Systemic lupus ([place]) 10/31/2012   Vitamin D deficiency 03/16/2012   Generalized pain 03/15/2012   Constipation 10/06/2011   Hip pain 06/29/2011   Barrett's esophagus 12/13/2010   Western blot positive HSV2 06/22/2007   Depression 06/22/2007   Essential hypertension 06/22/2007   GERD (gastroesophageal reflux disease) 06/22/2007   DJD (degenerative joint disease) 06/22/2007   NECK PAIN, CHRONIC 06/22/2007   Chronic midline low back pain with sciatica 06/22/2007   Insomnia secondary to depression with anxiety 06/22/2007    Conditions to be addressed/monitored: Depression; Mental Health Concerns   Care Plan : LCSW Plan of Care  Updates made by Deirdre Peer, LCSW since 01/21/2021 12:00 AM     Problem: Symptoms (Depression)      Long-Range Goal: Symptoms Monitored and Managed   Start Date: 12/17/2020  Expected End Date: 03/08/2021  This Visit's Progress: On track  Recent Progress: On track  Priority: High  Note:   Current Barriers:  Chronic Mental Health needs related to depression, anxiety Mental Health Concerns  Suicidal Ideation/Homicidal Ideation: No  Clinical Social Work Goal(s):  patient will work with SW  as needed  by telephone  to reduce or manage symptoms related to depression demonstrate a reduction in symptoms related to :Anxiety and Depression    Interventions: 01/20/21-Pt is awaiting coordination by [facility] for counseling- will inquire on updates. Pt reports being sick and not feeling well- planning for tele-health visit with PCP CSW spoke with pt today by phone who reports a history of depression- she denies any current or past SI/HI and indicates her husband of 6 years and her adult children are a strong support for her. Pt was on RX (Cymbalta 20mg  BID) about one year ago and lost her providers Estate agent) when they moved out of the office in [place].  Pt is open to getting connected with a new Psychiatrist and counselor- prefers local in-person counseling.   CSW completed a PHQ9 Depression Screening with pt today; pt scoring a  "12" today (moderately severe category). Pt admits to losing weight and having difficulty with sleep- does have Xanax which she takes PRN. Depression screen [facility] 2/9 12/17/2020 10/28/2020 10/13/2020 10/13/2020 07/30/2020  Decreased Interest 3 1 0 0 0  Down, Depressed, Hopeless 1 2 1 1 1   PHQ - 2 Score 4  3 1 1 1   Altered sleeping 3 0 - - -  Tired, decreased energy 1 2 - - -  Change in appetite 3 2 - - -  Feeling bad or failure about yourself  0 3 - - -  Trouble concentrating 0 0 - - -  Moving slowly or fidgety/restless 1 0 - - -  Suicidal thoughts 0 0 - - -  PHQ-9 Score 12 10 - - -  Difficult doing work/chores Somewhat difficult Somewhat difficult - - -  Some recent data might be hidden      Patient interviewed and appropriate assessments performed: PHQ 9 1:1 collaboration with [name], MD regarding development and update of comprehensive plan of care as evidenced by provider attestation and co-signature Patient interviewed and appropriate assessments performed Provided mental health counseling with regard to treatment plans/needs  v Provided patient with information about mental health 988 hotline Discussed plans with patient for ongoing care management follow up and  provided patient with direct contact information for care management team Referred patient to [facility] for linking pt with Psychiatrist and long term follow up  therapy/counseling Depression screen reviewed  PHQ2/ PHQ9 completed Solution-Focused Strategies Active listening / Reflection utilized  Emotional Support Provided Problem Solving [place] strategies reviewed Provided psychoeducation for mental health needs   Patient Self Care Activities:  Self administers medications as prescribed Attends church or other social activities Performs ADL's independently Calls provider office for new concerns or questions Ability for insight Independent living Motivation for treatment Strong family or social support  Patient Coping Strengths:  Supportive Relationships Family Friends Hopefulness Self Advocate Able to Communicate Effectively  Patient Self Care Deficits:  No current provider for Psychiatry or counseling    Patient Goals:  - avoid negative self-talk - develop a personal safety plan - have a plan for how to handle bad days - journal feelings and what helps to feel better or worse - spend time or talk with others every day - watch for early signs of feeling worse - begin personal counseling - start or continue a personal journal - practice positive thinking and self-talk - call to cancel if needed - keep a calendar with prescription refill dates - keep a calendar with appointment dates  Follow Up Plan: Appointment scheduled for SW follow up with client by phone on:  02/19/21      Follow Up Plan: Appointment scheduled for SW follow up with client by phone on: 02/19/21      .jcs

## 2021-01-21 NOTE — Patient Instructions (Signed)
Visit Information  Thank you for taking time to visit with me today. Please don't hesitate to contact me if I can be of assistance to you before our next scheduled telephone appointment.  Following are the goals we discussed today:  (Copy and paste patient goals from clinical care plan here)  Our next appointment is by telephone on 02/19/21 at 10:30  Please call the care guide team at [phone] if you need to cancel or reschedule your appointment.   If you are experiencing a Mental Health or [place] or need someone to talk to, please call the Suicide and Crisis Lifeline: 988 call the [place] National Suicide Prevention Lifeline: (910)858-2564 or TTY: 816-206-4868 TTY 641-682-9839) to talk to a trained counselor call the [facility]: [phone] call 911   The patient verbalized understanding of instructions, educational materials, and care plan provided today and declined offer to receive copy of patient instructions, educational materials, and care plan.   Eduard Clos MSW, LCSW Licensed Music therapist Primary Care [phone]

## 2021-01-21 NOTE — Telephone Encounter (Signed)
Renee Waters from [facility] called in critical lab WBC 101.8.  Dr. Moshe Cipro notified at 3:15 pm.

## 2021-01-21 NOTE — Patient Instructions (Addendum)
F/U early feb, call if you need me sooner  Please take tylenol  for fever and body aches, flu and rapid strep tests are negative  cBC and diff and cmp and eGFR stat at hospital today

## 2021-01-22 ENCOUNTER — Telehealth: Payer: Self-pay | Admitting: Family Medicine

## 2021-01-22 NOTE — Telephone Encounter (Signed)
Pt returning phone call ° °

## 2021-01-23 LAB — SARS-COV-2, NAA 2 DAY TAT

## 2021-01-23 LAB — NOVEL CORONAVIRUS, NAA: SARS-CoV-2, NAA: NOT DETECTED

## 2021-01-25 DIAGNOSIS — D469 Myelodysplastic syndrome, unspecified: Secondary | ICD-10-CM | POA: Diagnosis not present

## 2021-01-25 DIAGNOSIS — D7581 Myelofibrosis: Secondary | ICD-10-CM | POA: Diagnosis not present

## 2021-01-25 NOTE — Telephone Encounter (Signed)
Patient aware.

## 2021-01-25 NOTE — Progress Notes (Signed)
Patient aware.

## 2021-01-27 DIAGNOSIS — G894 Chronic pain syndrome: Secondary | ICD-10-CM | POA: Diagnosis not present

## 2021-01-27 DIAGNOSIS — Z79891 Long term (current) use of opiate analgesic: Secondary | ICD-10-CM | POA: Diagnosis not present

## 2021-01-27 DIAGNOSIS — M47816 Spondylosis without myelopathy or radiculopathy, lumbar region: Secondary | ICD-10-CM | POA: Diagnosis not present

## 2021-01-27 DIAGNOSIS — M47812 Spondylosis without myelopathy or radiculopathy, cervical region: Secondary | ICD-10-CM | POA: Diagnosis not present

## 2021-01-28 ENCOUNTER — Other Ambulatory Visit: Payer: Self-pay | Admitting: Orthopedic Surgery

## 2021-01-28 DIAGNOSIS — M1712 Unilateral primary osteoarthritis, left knee: Secondary | ICD-10-CM

## 2021-01-29 ENCOUNTER — Encounter: Payer: Self-pay | Admitting: Family Medicine

## 2021-01-29 DIAGNOSIS — D471 Chronic myeloproliferative disease: Secondary | ICD-10-CM | POA: Diagnosis not present

## 2021-01-29 DIAGNOSIS — R6889 Other general symptoms and signs: Secondary | ICD-10-CM | POA: Insufficient documentation

## 2021-01-29 DIAGNOSIS — J111 Influenza due to unidentified influenza virus with other respiratory manifestations: Secondary | ICD-10-CM | POA: Insufficient documentation

## 2021-01-29 DIAGNOSIS — C9310 Chronic myelomonocytic leukemia not having achieved remission: Secondary | ICD-10-CM | POA: Diagnosis not present

## 2021-01-29 DIAGNOSIS — D649 Anemia, unspecified: Secondary | ICD-10-CM | POA: Diagnosis not present

## 2021-01-29 NOTE — Assessment & Plan Note (Signed)
Rapid test negative

## 2021-01-29 NOTE — Progress Notes (Signed)
° °  Renee Waters     MRN: [id number]      DOB: 1946-01-23   HPI Ms. Runquist is here for follow up and re-evaluation of chronic medical conditions,  Has been undergoing extenmsive testing to establish a diagnosis with recent markedly elevated white cells and has upcoming appt at tertiary center as diagnosis is unclear, has been evaluated by local Oncology since 11/09/2020 C/o low grade temp, nasal congestion and headache for past 2 weeks. Denies sputum , productive cough, sinus or nasal drainage, denies ear pain Appetite is poor, and she has generalized weakness  ROS . Denies chest pains, palpitations and leg swelling Denies abdominal pain, nausea, vomiting,diarrhea or constipation.   Denies dysuria, frequency, hesitancy or incontinence. Chronic  joint pain, swelling and limitation in mobility. Denies headaches, seizures, numbness, or tingling. Denies depression, c/o anxiety as her health is deteriorating and diagnosis is not yet determined in past approx 3 months, multiple evaluations. Denies skin break down or rash.   PE  BP (!) 154/80    Pulse 94    Temp 100.2 F (37.9 C)    Resp 17    Ht 5\' 5"  (1.651 m)    Wt 119 lb 1.3 oz (54 kg)    SpO2 93%    BMI 19.82 kg/m   Patient alert and oriented and in no cardiopulmonary distress. Ill appearing  HEENT: No facial asymmetry, EOMI,     Neck supple . No cervical adenopathy. TM clear Chest: Clear to auscultation bilaterally.  CVS: S1, S2 no murmurs, no S3.Regular rate.  ABD: Soft non tender.   Ext: No edema  MS: decreased  ROM spine, shoulders, hips and knees.  Skin: Intact, no ulcerations or rash noted.  Psych: Good eye contact, . Memory intact mildly  anxious and  depressed appearing.  CNS: CN 2-12 intact, power,  normal throughout.no focal deficits noted.   Assessment & Plan  Influenza-like symptoms Rapid test negative  Sore throat Rapid strep negative, advised salt water gargles, honey and lime  Fever Unspecified  cause, may be linked to heme/ onc disorder, general presentation inc history and exam not consistent with infection, obtain lab data, advised tylenol for use for fever and pain, and push liquids Covid test also performed  Essential hypertension DASH diet and commitment to daily physical activity for a minimum of 30 minutes discussed and encouraged, as a part of hypertension management. The importance of attaining a healthy weight is also discussed.  BP/Weight 01/21/2021 01/12/2021 12/14/2020 11/30/2020 11/18/2020 11/09/2020 04/14/6576  Systolic BP 469 629 528 92 413 244 010  Diastolic BP 80 91 71 62 72 85 69  Wt. (Lbs) 119.08 115.8 117 116.9 110 116.6 110  BMI 19.82 19.27 19.47 19.45 18.3 19.4 18.3  Some encounter information is confidential and restricted. Go to Review Flowsheets activity to see all data.   Elevated at vist, needs to consistently take meds

## 2021-01-29 NOTE — Assessment & Plan Note (Signed)
Rapid strep negative, advised salt water gargles, honey and lime

## 2021-01-29 NOTE — Assessment & Plan Note (Signed)
DASH diet and commitment to daily physical activity for a minimum of 30 minutes discussed and encouraged, as a part of hypertension management. The importance of attaining a healthy weight is also discussed.  BP/Weight 01/21/2021 01/12/2021 12/14/2020 11/30/2020 11/18/2020 11/09/2020 6/41/5830  Systolic BP 940 768 088 92 110 315 945  Diastolic BP 80 91 71 62 72 85 69  Wt. (Lbs) 119.08 115.8 117 116.9 110 116.6 110  BMI 19.82 19.27 19.47 19.45 18.3 19.4 18.3  Some encounter information is confidential and restricted. Go to Review Flowsheets activity to see all data.   Elevated at vist, needs to consistently take meds

## 2021-01-29 NOTE — Assessment & Plan Note (Addendum)
Unspecified cause, may be linked to heme/ onc disorder, general presentation inc history and exam not consistent with infection, obtain lab data, advised tylenol for use for fever and pain, and push liquids Covid test also performed

## 2021-02-01 ENCOUNTER — Other Ambulatory Visit: Payer: Self-pay | Admitting: Family Medicine

## 2021-02-02 ENCOUNTER — Other Ambulatory Visit: Payer: Self-pay

## 2021-02-02 ENCOUNTER — Other Ambulatory Visit (HOSPITAL_COMMUNITY): Payer: Medicare Other

## 2021-02-02 ENCOUNTER — Inpatient Hospital Stay (HOSPITAL_COMMUNITY): Payer: Medicare Other

## 2021-02-02 DIAGNOSIS — Z9181 History of falling: Secondary | ICD-10-CM | POA: Diagnosis not present

## 2021-02-02 DIAGNOSIS — D72829 Elevated white blood cell count, unspecified: Secondary | ICD-10-CM | POA: Diagnosis not present

## 2021-02-02 DIAGNOSIS — M25561 Pain in right knee: Secondary | ICD-10-CM | POA: Diagnosis not present

## 2021-02-02 DIAGNOSIS — M25551 Pain in right hip: Secondary | ICD-10-CM | POA: Diagnosis not present

## 2021-02-02 DIAGNOSIS — M25562 Pain in left knee: Secondary | ICD-10-CM | POA: Diagnosis not present

## 2021-02-02 DIAGNOSIS — M25552 Pain in left hip: Secondary | ICD-10-CM | POA: Diagnosis not present

## 2021-02-02 LAB — CBC
HCT: 22.1 % — ABNORMAL LOW (ref 36.0–46.0)
Hemoglobin: 7.1 g/dL — ABNORMAL LOW (ref 12.0–15.0)
MCH: 31.3 pg (ref 26.0–34.0)
MCHC: 32.1 g/dL (ref 30.0–36.0)
MCV: 97.4 fL (ref 80.0–100.0)
Platelets: 193 10*3/uL (ref 150–400)
RBC: 2.27 MIL/uL — ABNORMAL LOW (ref 3.87–5.11)
RDW: 23.5 % — ABNORMAL HIGH (ref 11.5–15.5)
WBC: 166.3 10*3/uL (ref 4.0–10.5)
nRBC: 2.9 % — ABNORMAL HIGH (ref 0.0–0.2)

## 2021-02-02 LAB — PREPARE RBC (CROSSMATCH)

## 2021-02-02 NOTE — Progress Notes (Signed)
Renee Waters, [name] [id number]   CLINIC:  Medical Oncology/Hematology  PCP:  Renee Helper, MD [address], [place] / [place] [address]  [phone]  REASON FOR VISIT:  Follow-up for leukocytosis  PRIOR THERAPY: none  CURRENT THERAPY: under work-up  INTERVAL HISTORY:  Renee Waters, a 75 y.o. female, returns for routine follow-up for Renee Waters leukocytosis. Renee Waters was last seen on 01/12/2021.  Today Renee Waters reports feeling good. Renee Waters denies chest pains. Renee Waters reports fatigue and low levels of activities. Renee Waters appetite is poor. Renee Waters reports left sided abdominal pain which is worsened when Renee Waters leans to Renee Waters left. Renee Waters weight is stable. Renee Waters is drinking 1 Ensure daily.   REVIEW OF SYSTEMS:  Review of Systems  Constitutional:  Positive for appetite change (50%) and fatigue (50%). Negative for unexpected weight change.  Cardiovascular:  Negative for chest pain.  Gastrointestinal:  Positive for abdominal pain (L side).  Musculoskeletal:  Positive for arthralgias (5/10 R knee) and back pain (5/10).  All other systems reviewed and are negative.  PAST MEDICAL/SURGICAL HISTORY:  Past Medical History:  Diagnosis Date   Acute cholangitis    Allergy    Anemia    Anxiety    Arthritis    Phreesia 03/18/2020   Barrett's esophagus    Cataract    Chronic back pain    Chronic neck pain    CKD (chronic kidney disease) stage 3, GFR 30-59 ml/min ([facility]) 10/29/2020   Depression    Genital herpes    GERD (gastroesophageal reflux disease)    H/O degenerative disc disease    History of blood transfusion    Hypertension    Insomnia    Lupus (systemic lupus erythematosus) ([facility])    Neuromuscular disorder ([facility])    Osteoarthritis    S/P colonoscopy June 2005   normal, no polyps   S/P endoscopy June 2005, Oct 2009   2005: short-segment Barrett's, 2009: short-segment Barrett's   UTI (lower urinary tract infection) 11/2012   Past Surgical History:   Procedure Laterality Date   ABDOMINAL HYSTERECTOMY     BACK SURGERY     BIOPSY N/A 03/20/2014   Procedure: BIOPSY;  Surgeon: Daneil Dolin, MD;  Location: [facility];  Service: Endoscopy;  Laterality: N/A;   BIOPSY  09/14/2015   Procedure: BIOPSY;  Surgeon: Daneil Dolin, MD;  Location: [facility];  Service: Endoscopy;;  esophageal and gastric   BIOPSY  07/23/2019   Procedure: BIOPSY;  Surgeon: Daneil Dolin, MD;  [facility];  Service: Endoscopy;;  esophageal    CARPAL TUNNEL RELEASE Left 2013   cervical disectomy  2002   CESAREAN SECTION N/A    Phreesia 03/18/2020   CHOLECYSTECTOMY     with lysis of adhesions for sbo; "ruptured gallbladder".   COLONOSCOPY  11/09/2011   RMR: Melanosis coli   COLONOSCOPY WITH PROPOFOL N/A 09/14/2015   Dr. Gala Romney: diverticulosis, 52m TA removed. next TCS 09/2020.    COLONOSCOPY WITH PROPOFOL N/A 04/30/2020   Procedure: COLONOSCOPY WITH PROPOFOL;  Surgeon: RDaneil Dolin MD;  [facility]: [facility];  Service: Endoscopy;  Laterality: N/A;  PM (ASA 3)   DENTAL SURGERY  11/2015   multiple tooth extraction   ESOPHAGOGASTRODUODENOSCOPY  11/29/2007   salmon-colored  tongue   longest stable at  3 cm, distal esophagus as described previously status post biopsy/ Hiatal hernia, otherwise normal stomach D1 and D2   ESOPHAGOGASTRODUODENOSCOPY  01/06/11  short segment Barrett's esophagus s/p bx/Hiatal hernia   ESOPHAGOGASTRODUODENOSCOPY (EGD) WITH PROPOFOL N/A 03/20/2014   YEB:[id number] distal esophagus short segment barrett's, bx with no dysplasia. next egd in 03/2017   ESOPHAGOGASTRODUODENOSCOPY (EGD) WITH PROPOFOL N/A 09/14/2015   Dr. Gala Romney: Barrett's without dysplasia, gastritis benign bx, hiatal hernia. next EGD 09/2018.   ESOPHAGOGASTRODUODENOSCOPY (EGD) WITH PROPOFOL N/A 07/23/2019   Procedure: ESOPHAGOGASTRODUODENOSCOPY (EGD) WITH PROPOFOL;  Surgeon: Daneil Dolin, MD;  [facility];  Service: Endoscopy;  Laterality: N/A;  3:00pm   EYE  SURGERY N/A    Phreesia 03/18/2020   HERNIA REPAIR Right 07/2010   Dr. Zada Girt   JOINT REPLACEMENT     LAPAROSCOPIC CHOLECYSTECTOMY  2017   at [facility]   POLYPECTOMY  09/14/2015   Procedure: POLYPECTOMY;  Surgeon: Daneil Dolin, MD;  Location: [facility];  Service: Endoscopy;;  ascending colon   right hip replacement  07/2010   went back in sept 2012 to fix   SHOULDER ARTHROSCOPY  2008   left   SPINE SURGERY N/A    Phreesia 03/18/2020   TOTAL HIP REVISION Right 12/17/2012   Procedure: RIGHT TOTAL HIP REVISION;  Surgeon: Mauri Pole, MD;  Location: [facility];  Service: Orthopedics;  Laterality: Right;   WRIST SURGERY Right 2011   open reduction right wrist.    SOCIAL HISTORY:  Social History   Socioeconomic History   Marital status: Married    Spouse name: louis   Number of children: 4   Years of education: 12+   Highest education level: Some college, no degree  Occupational History   Occupation: Press photographer  - retired   Occupation: non profit  Tobacco Use   Smoking status: Former    Packs/day: 0.25    Years: 25.00    Pack years: 6.25    Types: Cigarettes    Quit date: 02/07/2003    Years since quitting: 18.0   Smokeless tobacco: Never   Tobacco comments:    quit in 2004  Vaping Use   Vaping Use: Never used  Substance and Sexual Activity   Alcohol use: No   Drug use: No   Sexual activity: Not Currently    Birth control/protection: Surgical  Other Topics Concern   Not on file  Social History Narrative   Not on file   Social Determinants of Health   Financial Resource Strain: Low Risk    Difficulty of Paying Living Expenses: Not hard at all  Food Insecurity: No Food Insecurity   Worried About Charity fundraiser in the Last Year: Never true   [place] in the Last Year: Never true  Transportation Needs: No Transportation Needs   Lack of Transportation (Medical): No   Lack of Transportation (Non-Medical): No  Physical Activity: Insufficiently Active    Days of Exercise per Week: 6 days   Minutes of Exercise per Session: 20 min  Stress: Stress Concern Present   Feeling of Stress : Very much  Social Connections: Moderately Integrated   Frequency of Communication with Friends and Family: More than three times a week   Frequency of Social Gatherings with Friends and Family: Once a week   Attends Religious Services: More than 4 times per year   Active Member of [employer] or Organizations: No   Attends Archivist Meetings: Never   Marital Status: Married  Human resources officer Violence: Not At Risk   Fear of Current or Ex-Partner: No   Emotionally Abused: No  Physically Abused: No   Sexually Abused: No    FAMILY HISTORY:  Family History  Problem Relation Age of Onset   Hypertension Mother    Stroke Mother    Colon cancer Neg Hx    Anesthesia problems Neg Hx    Hypotension Neg Hx    Malignant hyperthermia Neg Hx    Pseudochol deficiency Neg Hx    Gastric cancer Neg Hx    Esophageal cancer Neg Hx     CURRENT MEDICATIONS:  Current Outpatient Medications  Medication Sig Dispense Refill   acyclovir (ZOVIRAX) 400 MG tablet Take 1 tablet (400 mg total) by mouth 2 (two) times daily. 60 tablet 3   allopurinol (ZYLOPRIM) 300 MG tablet Take 1 tablet (300 mg total) by mouth daily. 30 tablet 3   ALPRAZolam (XANAX) 0.5 MG tablet TAKE 1 TABLET BY MOUTH AT BEDTIME 30 tablet 0   dicyclomine (BENTYL) 10 MG capsule Take 1 capsule (10 mg total) by mouth 2 (two) times daily as needed for spasms. 30 capsule 0   EPINEPHRINE 0.3 mg/0.3 mL IJ SOAJ injection INJECT 0.3 MLS INTO MUSCLE ONCE AS NEEDED 1 Device 2   gabapentin (NEURONTIN) 300 MG capsule TAKE 1 CAPSULE BY MOUTH ONCE DAILY FOR  CHRONIC  PAIN (Patient taking differently: TAKE 1 CAPSULE BY MOUTH ONCE DAILY FOR  CHRONIC  PAIN) 90 capsule 0   hydrochlorothiazide (HYDRODIURIL) 25 MG tablet Take 1 tablet by mouth once daily 90 tablet 0   losartan (COZAAR) 25 MG tablet Take 1 tablet by mouth  once daily for blood pressure 30 tablet 0   meloxicam (MOBIC) 7.5 MG tablet Take 1 tablet (7.5 mg total) by mouth daily as needed for pain (knee pain). 30 tablet 5   omeprazole (PRILOSEC) 40 MG capsule Take by mouth daily.     pantoprazole (PROTONIX) 40 MG tablet TAKE 1 TABLET BY MOUTH ONCE DAILY BEFORE BREAKFAST 90 tablet 3   potassium chloride (KLOR-CON) 10 MEQ tablet      ruxolitinib phosphate (JAKAFI) 10 MG tablet Take 1 tablet (10 mg total) by mouth 2 (two) times daily. 60 tablet 0   tiZANidine (ZANAFLEX) 4 MG tablet Take 8 mg by mouth 3 (three) times daily as needed (spasms). 180 tablet 0   topiramate (TOPAMAX) 50 MG tablet      valsartan (DIOVAN) 80 MG tablet Take by mouth.     Oxycodone HCl 10 MG TABS Take 1 tablet (10 mg total) by mouth 4 (four) times daily. (Patient not taking: Reported on 02/03/2021) 56 tablet 0   prednisoLONE acetate (PRED FORTE) 1 % ophthalmic suspension Place into the right eye. (Patient not taking: Reported on 01/21/2021)     Probiotic Product (EQL DAILY PROBIOTIC PO) Take 1 capsule by mouth daily. (Patient not taking: Reported on 01/21/2021)     temazepam (RESTORIL) 30 MG capsule Take 1 capsule by mouth at bedtime 30 capsule 0   No current facility-administered medications for this visit.    ALLERGIES:  Allergies  Allergen Reactions   Bee Venom Swelling and Hives   Acetaminophen     itches   Amlodipine     Hair loss   Tyloxapol    Other Itching, Rash and Swelling   Oxycodone-Acetaminophen Rash    Pt states, "this gives Renee Waters a rash, but at home Renee Waters takes oxycodone for pain relief" Pt states, "this gives Renee Waters a rash, but at home Renee Waters takes oxycodone for pain relief"    PHYSICAL EXAM:  Performance status (ECOG): 1 -  Symptomatic but completely ambulatory  Vitals:   02/03/21 0817  BP: 114/75  Pulse: 77  Resp: 18  Temp: (!) 97.1 F (36.2 C)  SpO2: 92%   Wt Readings from Last 3 Encounters:  02/03/21 115 lb 8.3 oz (52.4 kg)  01/21/21 119 lb 1.3 oz  (54 kg)  01/12/21 115 lb 12.8 oz (52.5 kg)   Physical Exam Vitals reviewed.  Constitutional:      Appearance: Normal appearance.  Cardiovascular:     Rate and Rhythm: Normal rate and regular rhythm.     Pulses: Normal pulses.     Heart sounds: Normal heart sounds.  Pulmonary:     Effort: Pulmonary effort is normal.     Breath sounds: Normal breath sounds.  Abdominal:     Palpations: Abdomen is soft. There is no hepatomegaly, splenomegaly or mass.     Tenderness: There is no abdominal tenderness.  Neurological:     General: No focal deficit present.     Mental Status: Renee Waters is alert and oriented to person, place, and time.  Psychiatric:        Mood and Affect: Mood normal.        Behavior: Behavior normal.    LABORATORY DATA:  I have reviewed the labs as listed.  CBC Latest Ref Rng & Units 02/02/2021 01/21/2021 01/12/2021  WBC 4.0 - 10.5 K/uL 166.3(HH) 101.8(HH) 103.7(HH)  Hemoglobin 12.0 - 15.0 g/dL 7.1(L) 8.6(L) 8.5(L)  Hematocrit 36.0 - 46.0 % 22.1(L) 26.9(L) 25.8(L)  Platelets 150 - 400 K/uL 193 341 327   CMP Latest Ref Rng & Units 01/21/2021 11/06/2020 10/21/2020  Glucose 70 - 99 mg/dL 114(H) 79 65  BUN 8 - 23 mg/dL 25(H) 17 22  Creatinine 0.44 - 1.00 mg/dL 1.55(H) 1.37(H) 1.56(H)  Sodium 135 - 145 mmol/L 140 140 140  Potassium 3.5 - 5.1 mmol/L 3.5 4.2 4.1  Chloride 98 - 111 mmol/L 105 107 102  CO2 22 - 32 mmol/L 24 25 19(L)  Calcium 8.9 - 10.3 mg/dL 8.7(L) 8.7(L) 9.1  Total Protein 6.5 - 8.1 g/dL 8.6(H) 8.6(H) 7.8  Total Bilirubin 0.3 - 1.2 mg/dL 0.6 0.7 0.3  Alkaline Phos 38 - 126 U/L 173(H) 186(H) 194(H)  AST 15 - 41 U/L 35 33 41(H)  ALT 0 - 44 U/L 15 15 14       Component Value Date/Time   RBC 2.27 (L) 02/02/2021 1314   MCV 97.4 02/02/2021 1314   MCV 91 05/27/2020 1632   MCH 31.3 02/02/2021 1314   MCHC 32.1 02/02/2021 1314   RDW 23.5 (H) 02/02/2021 1314   RDW 14.1 05/27/2020 1632   LYMPHSABS 6.1 (H) 01/21/2021 1209   LYMPHSABS 1.7 05/27/2020 1632   MONOABS  9.2 (H) 01/21/2021 1209   EOSABS 2.0 (H) 01/21/2021 1209   EOSABS 0.2 05/27/2020 1632   BASOSABS 0.0 01/21/2021 1209   BASOSABS 0.0 05/27/2020 1632    DIAGNOSTIC IMAGING:  I have independently reviewed the scans and discussed with the patient. No results found.   ASSESSMENT:  Leukocytosis with left shift: - CBC on 11/06/2020 with white count 75.6, differential 59% neutrophils, 12% monocytes, 4% lymphocytes, 1 percentage of eosinophils and basophils, 6% band neutrophils, 12% myelocytes, 1% promyelocytes, 29% blasts. - Pathologist review of blood smear reported as leukoerythroblastic reaction. - 25 pound weight loss in the last 6 months, unintentional.  Decreased appetite.  Reports fatigue for the last few months.  Reports night sweats x3 in the last 1 month. - Bone marrow biopsy on 11/18/2020  consistent with granulocytic proliferation with differential including CMML versus myeloproliferative disorder. - BCR/ABL was negative. 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 - NGS myeloid panel shows mutations in ASXL1, MPL, TET2, EZH2 mutations. - Spleen ultrasound on 12/16/2020 shows mildly enlarged measuring 12.4 x 9.6 x 7 cm with volume of 435 cc.  2.  Social/family history: - Renee Waters lives at home and is able to do all Renee Waters ADLs and IADLs although Renee Waters is getting tired lately.  Renee Waters reports quitting smoking 6 months ago and smoked 1 pack/week for 43 years. - Renee Waters believes that Renee Waters mother had some kind of leukemia.  No other malignancies.   PLAN:  MPL positive myeloproliferative neoplasm: - PDGFR alpha, beta and FGFR1 was negative. - Renee Waters was evaluated by Dr. Florene Glen on 01/29/2021. - Dr. Florene Glen has called and talked to me on the same day.  Renee Waters slides were reviewed at [facility] hematopathology.  They thought it was less likely CMML and more likely MPL positive myeloproliferative neoplasm.  Dr. Florene Glen has recommended to initiate Jakafi. - I have discussed with the patient about  the medication and side effects in detail.  Renee Waters is agreeable to initiate ruxolitinib. - We will start Renee Waters out low-dose of 10 mg twice daily and try to increase it to 20 mg twice daily. - Reviewed CBC today which showed hemoglobin 7.1.  White count is 166.  Platelet count is 193.  MCV is 97. - Renee Waters complains of feeling tired.  No weight loss reported. - We talked about port placement which Renee Waters is agreeable for easy blood draws.  We will make referral. - We have sent a prescription for Jakafi 10 mg twice daily.  RTC approximately 3 weeks with labs including tumor lysis labs.  2.  Decreased appetite: - Renee Waters weight is more or less stable in the last couple of months. - Renee Waters is drinking Ensure 1 can/day. - If there is any weight loss consider appetite stimulants.  3.  CKD: - Baseline creatinine between 1.3-1.5.  Continue follow-up with Dr. Theador Hawthorne.  4.  Hyperuricemia: - Continue allopurinol 300 mg daily.  Orders placed this encounter:  Orders Placed This Encounter  Procedures   IR IMAGING GUIDED PORT INSERTION   CBC with Differential   Comprehensive metabolic panel   Lactate dehydrogenase   Phosphorus   Uric acid   Informed Consent Details: Physician/Practitioner Attestation; Transcribe to consent form and obtain patient signature     Derek Jack, MD [facility] [phone]   I, Thana Ates, am acting as a scribe for Dr. Derek Jack.  I, Derek Jack MD, have reviewed the above documentation for accuracy and completeness, and I agree with the above.

## 2021-02-02 NOTE — Progress Notes (Unsigned)
CRITICAL VALUE ALERT Critical value received:  WBC 166.3 Date of notification:  02-02-21 Time of notification: 1025 Critical value read back:  Yes.   Nurse who received alert:  C.Aniyla Harling RN MD notified time and response:  1430

## 2021-02-03 ENCOUNTER — Other Ambulatory Visit: Payer: Self-pay | Admitting: Family Medicine

## 2021-02-03 ENCOUNTER — Other Ambulatory Visit (HOSPITAL_COMMUNITY): Payer: Self-pay

## 2021-02-03 ENCOUNTER — Inpatient Hospital Stay (HOSPITAL_BASED_OUTPATIENT_CLINIC_OR_DEPARTMENT_OTHER): Payer: Medicare Other | Admitting: Hematology

## 2021-02-03 ENCOUNTER — Encounter (HOSPITAL_COMMUNITY): Payer: Self-pay | Admitting: Hematology

## 2021-02-03 ENCOUNTER — Ambulatory Visit (HOSPITAL_COMMUNITY): Payer: Medicare Other | Admitting: Hematology

## 2021-02-03 ENCOUNTER — Inpatient Hospital Stay (HOSPITAL_COMMUNITY): Payer: Medicare Other

## 2021-02-03 VITALS — BP 173/89 | HR 84 | Temp 98.0°F | Resp 18

## 2021-02-03 VITALS — BP 114/75 | HR 77 | Temp 97.1°F | Resp 18 | Ht 64.96 in | Wt 115.5 lb

## 2021-02-03 DIAGNOSIS — D72829 Elevated white blood cell count, unspecified: Secondary | ICD-10-CM | POA: Diagnosis not present

## 2021-02-03 DIAGNOSIS — R10811 Right upper quadrant abdominal tenderness: Secondary | ICD-10-CM | POA: Diagnosis not present

## 2021-02-03 DIAGNOSIS — M25551 Pain in right hip: Secondary | ICD-10-CM | POA: Diagnosis not present

## 2021-02-03 DIAGNOSIS — M25552 Pain in left hip: Secondary | ICD-10-CM | POA: Diagnosis not present

## 2021-02-03 DIAGNOSIS — Z87898 Personal history of other specified conditions: Secondary | ICD-10-CM

## 2021-02-03 DIAGNOSIS — M25562 Pain in left knee: Secondary | ICD-10-CM | POA: Diagnosis not present

## 2021-02-03 DIAGNOSIS — Z9181 History of falling: Secondary | ICD-10-CM | POA: Diagnosis not present

## 2021-02-03 DIAGNOSIS — M25561 Pain in right knee: Secondary | ICD-10-CM | POA: Diagnosis not present

## 2021-02-03 MED ORDER — ACETAMINOPHEN 325 MG PO TABS
650.0000 mg | ORAL_TABLET | Freq: Once | ORAL | Status: AC
  Administered 2021-02-03: 09:00:00 650 mg via ORAL
  Filled 2021-02-03: qty 2

## 2021-02-03 MED ORDER — RUXOLITINIB PHOSPHATE 10 MG PO TABS
10.0000 mg | ORAL_TABLET | Freq: Two times a day (BID) | ORAL | 0 refills | Status: DC
  Filled 2021-02-03 – 2021-02-16 (×2): qty 60, 30d supply, fill #0

## 2021-02-03 MED ORDER — SODIUM CHLORIDE 0.9% IV SOLUTION
250.0000 mL | Freq: Once | INTRAVENOUS | Status: AC
  Administered 2021-02-03: 09:00:00 250 mL via INTRAVENOUS

## 2021-02-03 MED ORDER — DIPHENHYDRAMINE HCL 25 MG PO CAPS
25.0000 mg | ORAL_CAPSULE | Freq: Once | ORAL | Status: AC
  Administered 2021-02-03: 09:00:00 25 mg via ORAL
  Filled 2021-02-03: qty 1

## 2021-02-03 NOTE — Progress Notes (Signed)
Patient assessed by [name] today, patient will receive 1 unit of blood today. Patient received pre-medications prior to transfusion. Patient tolerated blood transfusion with no complaints voiced. Peripheral IV site clean and dry with good blood return noted before and after infusion. Band aid applied. VSS with discharge and left in satisfactory condition with no s/s of distress noted.

## 2021-02-03 NOTE — Patient Instructions (Signed)
[facility]  Discharge Instructions: Thank you for choosing [facility] to provide your oncology and hematology care.  If you have a lab appointment with the [facility], please come in thru the Main Entrance and check in at the main information desk.  Wear comfortable clothing and clothing appropriate for easy access to any Portacath or PICC line.   We strive to give you quality time with your provider. You may need to reschedule your appointment if you arrive late (15 or more minutes).  Arriving late affects you and other patients whose appointments are after yours.  Also, if you miss three or more appointments without notifying the office, you may be dismissed from the clinic at the providers discretion.      For prescription refill requests, have your pharmacy contact our office and allow 72 hours for refills to be completed.    Today you received the following 1 unit of packed red blood cells.   To help prevent nausea and vomiting after your treatment, we encourage you to take your nausea medication as directed.  BELOW ARE SYMPTOMS THAT SHOULD BE REPORTED IMMEDIATELY: *FEVER GREATER THAN 100.4 F (38 C) OR HIGHER *CHILLS OR SWEATING *NAUSEA AND VOMITING THAT IS NOT CONTROLLED WITH YOUR NAUSEA MEDICATION *UNUSUAL SHORTNESS OF BREATH *UNUSUAL BRUISING OR BLEEDING *URINARY PROBLEMS (pain or burning when urinating, or frequent urination) *BOWEL PROBLEMS (unusual diarrhea, constipation, pain near the anus) TENDERNESS IN MOUTH AND THROAT WITH OR WITHOUT PRESENCE OF ULCERS (sore throat, sores in mouth, or a toothache) UNUSUAL RASH, SWELLING OR PAIN  UNUSUAL VAGINAL DISCHARGE OR ITCHING   Items with * indicate a potential emergency and should be followed up as soon as possible or go to the Emergency Department if any problems should occur.  Please show the CHEMOTHERAPY ALERT CARD or IMMUNOTHERAPY ALERT CARD at check-in to the Emergency Department and triage  nurse.  Should you have questions after your visit or need to cancel or reschedule your appointment, please contact [facility] [phone]  and follow the prompts.  Office hours are 8:00 a.m. to 4:30 p.m. Monday - Friday. Please note that voicemails left after 4:00 p.m. may not be returned until the following business day.  We are closed weekends and major holidays. You have access to a nurse at all times for urgent questions. Please call the main number to the clinic [phone] and follow the prompts.  For any non-urgent questions, you may also contact your provider using MyChart. We now offer e-Visits for anyone 75 and older to request care online for non-urgent symptoms. For details visit [email or web address].   Also download the MyChart app! Go to the app store, search "MyChart", open the app, select [facility], and log in with your MyChart username and password.  Due to Covid, a mask is required upon entering the hospital/clinic. If you do not have a mask, one will be given to you upon arrival. For doctor visits, patients may have 1 support person aged 75 or older with them. For treatment visits, patients cannot have anyone with them due to current Covid guidelines and our immunocompromised population.

## 2021-02-03 NOTE — Patient Instructions (Addendum)
[facility] at [facility] Discharge Instructions   You were seen and examined today by [name].  We will proceed with blood transfusion today.   Dr. Raliegh Ip spoke with Dr. Florene Glen - they recommend starting you on a drug called Jakafi.  This pill is started at a low dose and gradually increased to make sure you tolerate the drug without bad side effects.   Continue allopurinol as prescribed until Dr. Raliegh Ip tells you to stop.   We will refer you for port placement.  Return as scheduled.       Thank you for choosing [facility] at [facility] to provide your oncology and hematology care.  To afford each patient quality time with our provider, please arrive at least 15 minutes before your scheduled appointment time.   If you have a lab appointment with the [facility] please come in thru the Main Entrance and check in at the main information desk.  You need to re-schedule your appointment should you arrive 10 or more minutes late.  We strive to give you quality time with our providers, and arriving late affects you and other patients whose appointments are after yours.  Also, if you no show three or more times for appointments you may be dismissed from the clinic at the providers discretion.     Again, thank you for choosing [facility].  Our hope is that these requests will decrease the amount of time that you wait before being seen by our physicians.       _____________________________________________________________  Should you have questions after your visit to [facility], please contact our office at [phone] and follow the prompts.  Our office hours are 8:00 a.m. and 4:30 p.m. Monday - Friday.  Please note that voicemails left after 4:00 p.m. may not be returned until the following business day.  We are closed weekends and major holidays.  You do have access to a nurse 24-7, just call the main number to the  clinic [phone] and do not press any options, hold on the line and a nurse will answer the phone.    For prescription refill requests, have your pharmacy contact our office and allow 72 hours.    Due to Covid, you will need to wear a mask upon entering the hospital. If you do not have a mask, a mask will be given to you at the Main Entrance upon arrival. For doctor visits, patients may have 1 support person age 20 or older with them. For treatment visits, patients can not have anyone with them due to social distancing guidelines and our immunocompromised population.

## 2021-02-04 ENCOUNTER — Other Ambulatory Visit (HOSPITAL_COMMUNITY): Payer: Self-pay

## 2021-02-04 ENCOUNTER — Telehealth (HOSPITAL_COMMUNITY): Payer: Self-pay | Admitting: Pharmacist

## 2021-02-04 ENCOUNTER — Telehealth: Payer: Self-pay | Admitting: Pharmacy Technician

## 2021-02-04 LAB — TYPE AND SCREEN
ABO/RH(D): O POS
Antibody Screen: POSITIVE
DAT, IgG: NEGATIVE
Donor AG Type: NEGATIVE
PT AG Type: NEGATIVE
Unit division: 0

## 2021-02-04 LAB — BPAM RBC
Blood Product Expiration Date: 202301252359
ISSUE DATE / TIME: 202212281314
Unit Type and Rh: 5100

## 2021-02-04 NOTE — Telephone Encounter (Signed)
Oral Oncology Patient Advocate Encounter   Received New start notification for  [facility] . Will update as we work through the benefits process.   Initiated a Prior Authorization request to [employer] for  Jakafi 10mg   via CoverMyMeds.   Key: [id number] - [place] Case ID: [id number]

## 2021-02-04 NOTE — Telephone Encounter (Signed)
Oral Oncology Pharmacist Encounter  Received new prescription for Jakafi (ruxolitinib) for the treatment of MPL positive myeloproliferative neoplasm, planned duration until disease progression or unacceptable drug toxicity.  CBC from 02/02/21 and CMP from 01/21/21 assessed, no relevant lab abnormalities. Prescription dose and frequency assessed. MD plans to start her out low-dose of 10 mg twice daily and try to increase it to 20 mg twice daily.  Current medication list in Epic reviewed, no DDIs with ruxolitinib identified.  Evaluated chart and no patient barriers to medication adherence identified.   Prescription has been e-scribed to the [facility] for benefits analysis and approval.  Oral Oncology Clinic will continue to follow for insurance authorization, copayment issues, initial counseling and start date.   Darl Pikes, PharmD, BCPS, BCOP, CPP Hematology/Oncology Clinical Pharmacist Practitioner [place]/[facility] [phone]  02/04/2021 3:45 PM

## 2021-02-05 ENCOUNTER — Other Ambulatory Visit (HOSPITAL_COMMUNITY): Payer: Self-pay

## 2021-02-05 ENCOUNTER — Other Ambulatory Visit (HOSPITAL_COMMUNITY): Payer: Self-pay | Admitting: Physician Assistant

## 2021-02-05 NOTE — Telephone Encounter (Signed)
Oral Oncology Patient Advocate Encounter  Prior Authorization for Renee Waters has been approved.    PA# [id number] Effective dates: 02/04/21 through 02/06/22  Patients co-pay is $2,881.12 for 30 days supply.  Oral Oncology Clinic will continue to follow.

## 2021-02-06 DIAGNOSIS — I10 Essential (primary) hypertension: Secondary | ICD-10-CM

## 2021-02-06 DIAGNOSIS — M159 Polyosteoarthritis, unspecified: Secondary | ICD-10-CM

## 2021-02-06 DIAGNOSIS — F331 Major depressive disorder, recurrent, moderate: Secondary | ICD-10-CM

## 2021-02-09 ENCOUNTER — Other Ambulatory Visit: Payer: Self-pay

## 2021-02-09 ENCOUNTER — Other Ambulatory Visit (HOSPITAL_COMMUNITY): Payer: Self-pay

## 2021-02-09 ENCOUNTER — Ambulatory Visit (HOSPITAL_COMMUNITY)
Admission: RE | Admit: 2021-02-09 | Discharge: 2021-02-09 | Disposition: A | Discharge status: Home / Self Care | Payer: Medicare Other | Source: Ambulatory Visit | Attending: Hematology | Admitting: Hematology

## 2021-02-09 ENCOUNTER — Encounter (HOSPITAL_COMMUNITY): Payer: Self-pay

## 2021-02-09 DIAGNOSIS — I129 Hypertensive chronic kidney disease with stage 1 through stage 4 chronic kidney disease, or unspecified chronic kidney disease: Secondary | ICD-10-CM | POA: Insufficient documentation

## 2021-02-09 DIAGNOSIS — Z452 Encounter for adjustment and management of vascular access device: Secondary | ICD-10-CM | POA: Diagnosis not present

## 2021-02-09 DIAGNOSIS — M3214 Glomerular disease in systemic lupus erythematosus: Secondary | ICD-10-CM | POA: Diagnosis not present

## 2021-02-09 DIAGNOSIS — Z87898 Personal history of other specified conditions: Secondary | ICD-10-CM

## 2021-02-09 DIAGNOSIS — N183 Chronic kidney disease, stage 3 unspecified: Secondary | ICD-10-CM | POA: Insufficient documentation

## 2021-02-09 DIAGNOSIS — D471 Chronic myeloproliferative disease: Secondary | ICD-10-CM | POA: Diagnosis not present

## 2021-02-09 DIAGNOSIS — I878 Other specified disorders of veins: Secondary | ICD-10-CM | POA: Insufficient documentation

## 2021-02-09 HISTORY — PX: IR IMAGING GUIDED PORT INSERTION: IMG5740

## 2021-02-09 MED ORDER — LIDOCAINE-EPINEPHRINE (PF) 2 %-1:200000 IJ SOLN
INTRAMUSCULAR | Status: AC
  Filled 2021-02-09: qty 20

## 2021-02-09 MED ORDER — LIDOCAINE HCL 1 % IJ SOLN
INTRAMUSCULAR | Status: AC
  Filled 2021-02-09: qty 20

## 2021-02-09 MED ORDER — SODIUM CHLORIDE 0.9 % IV SOLN: INTRAVENOUS | Status: DC

## 2021-02-09 MED ORDER — FENTANYL CITRATE (PF) 100 MCG/2ML IJ SOLN
INTRAMUSCULAR | Status: AC
  Filled 2021-02-09: qty 2

## 2021-02-09 MED ORDER — FENTANYL CITRATE (PF) 100 MCG/2ML IJ SOLN
INTRAMUSCULAR | Status: AC | PRN
  Administered 2021-02-09: 50 ug via INTRAVENOUS

## 2021-02-09 MED ORDER — MIDAZOLAM HCL 2 MG/2ML IJ SOLN
INTRAMUSCULAR | Status: AC | PRN
Start: 2021-02-09 — End: 2021-02-09
  Administered 2021-02-09: .5 mg via INTRAVENOUS

## 2021-02-09 MED ORDER — FENTANYL CITRATE (PF) 100 MCG/2ML IJ SOLN
INTRAMUSCULAR | Status: AC | PRN
  Administered 2021-02-09: 25 ug via INTRAVENOUS

## 2021-02-09 MED ORDER — MIDAZOLAM HCL 2 MG/2ML IJ SOLN
INTRAMUSCULAR | Status: AC | PRN
  Administered 2021-02-09: .5 mg via INTRAVENOUS

## 2021-02-09 MED ORDER — MIDAZOLAM HCL 2 MG/2ML IJ SOLN
INTRAMUSCULAR | Status: AC
  Filled 2021-02-09: qty 4

## 2021-02-09 MED ORDER — LIDOCAINE-EPINEPHRINE (PF) 2 %-1:200000 IJ SOLN
INTRAMUSCULAR | Status: AC | PRN
  Administered 2021-02-09: 10 mL via INTRADERMAL

## 2021-02-09 MED ORDER — CEFAZOLIN SODIUM-DEXTROSE 2-4 GM/100ML-% IV SOLN
2.0000 g | Freq: Once | INTRAVENOUS | Status: AC
  Administered 2021-02-09: 2 g via INTRAVENOUS

## 2021-02-09 MED ORDER — HEPARIN SOD (PORK) LOCK FLUSH 100 UNIT/ML IV SOLN
INTRAVENOUS | Status: AC | PRN
  Administered 2021-02-09: 500 [IU] via INTRAVENOUS

## 2021-02-09 MED ORDER — LIDOCAINE-PRILOCAINE 2.5-2.5 % EX CREA: 1.0000 "application " | TOPICAL_CREAM | CUTANEOUS | 3 refills | Status: DC | PRN

## 2021-02-09 MED ORDER — MIDAZOLAM HCL 2 MG/2ML IJ SOLN
INTRAMUSCULAR | Status: AC | PRN
  Administered 2021-02-09: 1 mg via INTRAVENOUS

## 2021-02-09 MED ORDER — MIDAZOLAM HCL 2 MG/2ML IJ SOLN
INTRAMUSCULAR | Status: AC | PRN
Start: 2021-02-09 — End: 2021-02-09
  Administered 2021-02-09: 1 mg via INTRAVENOUS

## 2021-02-09 MED ORDER — LIDOCAINE HCL (PF) 1 % IJ SOLN
INTRAMUSCULAR | Status: AC | PRN
  Administered 2021-02-09: 10 mL via INTRADERMAL

## 2021-02-09 MED ORDER — CEFAZOLIN SODIUM-DEXTROSE 2-4 GM/100ML-% IV SOLN
INTRAVENOUS | Status: AC
  Filled 2021-02-09: qty 100

## 2021-02-09 MED ORDER — HEPARIN SOD (PORK) LOCK FLUSH 100 UNIT/ML IV SOLN
INTRAVENOUS | Status: AC
  Filled 2021-02-09: qty 5

## 2021-02-09 MED ORDER — FENTANYL CITRATE (PF) 100 MCG/2ML IJ SOLN
INTRAMUSCULAR | Status: AC | PRN
Start: 2021-02-09 — End: 2021-02-09
  Administered 2021-02-09: 50 ug via INTRAVENOUS

## 2021-02-09 NOTE — Progress Notes (Signed)
Bleeding noted from bandaid site to right lateral lower neck area near clavicle. Dr Anselm Pancoast paged. Pressure applied and covered site with gauze and applied tape securely.

## 2021-02-09 NOTE — Discharge Instructions (Signed)
For questions /concerns may call Interventional Radiology at [phone] ° °You may remove your dressing and shower tomorrow afternoon ° °DO NOT use EMLA cream for 2 weeks after port placement as the cream will remove surgical glue on your incision.    ° ° °Implanted Port Insertion, Care After °This sheet gives you information about how to care for yourself after your procedure. Your health care provider may also give you more specific instructions. If you have problems or questions, contact your health careprovider. °What can I expect after the procedure? °After the procedure, it is common to have: °Discomfort at the port insertion site. °Bruising on the skin over the port. This should improve over 3-4 days. °Follow these instructions at home: °Port care °After your port is placed, you will get a manufacturer's information card. The card has information about your port. Keep this card with you at all times. °Take care of the port as told by your health care provider. Ask your health care provider if you or a family member can get training for taking care of the port at home. A home health care nurse may also take care of the port. °Make sure to remember what type of port you have. °Incision care °Follow instructions from your health care provider about how to take care of your port insertion site. Make sure you: °Wash your hands with soap and water before and after you change your bandage (dressing). If soap and water are not available, use hand sanitizer. °Change your dressing as told by your health care provider. °Leave skin glue, or adhesive strips in place. These skin closures may need to stay in place for 2 weeks or longer.  °Check your port insertion site every day for signs of infection. Check for: °     - Redness, swelling, or pain. °                    - Fluid or blood. °     - Warmth. °     - Pus or a bad smell. °Activity °Return to your normal activities as told by your health care provider. Ask your  health care provider what activities are safe for you. °Do not lift anything that is heavier than 10 lb (4.5 kg), or the limit that you are told, until your health care provider says that it is safe. °General instructions °Take over-the-counter and prescription medicines only as told by your health care provider. °Do not take baths, swim, or use a hot tub until your health care provider approves. Ask your health care provider if you may take showers. You may only be allowed to take sponge baths. °Do not drive for 24 hours if you were given a sedative during your procedure. °Wear a medical alert bracelet in case of an emergency. This will tell any health care providers that you have a port. °Keep all follow-up visits as told by your health care provider. This is important. °Contact a health care provider if: °You cannot flush your port with saline as directed, or you cannot draw blood from the port. °You have a fever or chills. °You have redness, swelling, or pain around your port insertion site. °You have fluid or blood coming from your port insertion site. °Your port insertion site feels warm to the touch. °You have pus or a bad smell coming from the port insertion site. °Get help right away if: °You have chest pain or shortness of breath. °You have bleeding from   your port that you cannot control. °Summary °Take care of the port as told by your health care provider. Keep the manufacturer's information card with you at all times. °Change your dressing as told by your health care provider. °Contact a health care provider if you have a fever or chills or if you have redness, swelling, or pain around your port insertion site. °Keep all follow-up visits as told by your health care provider. °This information is not intended to replace advice given to you by your health care provider. Make sure you discuss any questions you have with your healthcare provider. ° °Moderate Conscious Sedation, Adult, Care After °This sheet  gives you information about how to care for yourself after your procedure. Your health care provider may also give you more specific instructions. If you have problems or questions, contact your health careprovider. °What can I expect after the procedure? °After the procedure, it is common to have: °Sleepiness for several hours. °Impaired judgment for several hours. °Difficulty with balance. °Vomiting if you eat too soon. °Follow these instructions at home: °For the time period you were told by your health care provider: °Rest. °Do not participate in activities where you could fall or become injured. °Do not drive or use machinery. °Do not drink alcohol. °Do not take sleeping pills or medicines that cause drowsiness. °Do not make important decisions or sign legal documents. °Do not take care of children on your own. °Eating and drinking ° °Follow the diet recommended by your health care provider. °Drink enough fluid to keep your urine pale yellow. °If you vomit: °Drink water, juice, or soup when you can drink without vomiting. °Make sure you have little or no nausea before eating solid foods. ° °General instructions °Take over-the-counter and prescription medicines only as told by your health care provider. °Have a responsible adult stay with you for the time you are told. It is important to have someone help care for you until you are awake and alert. °Do not smoke. °Keep all follow-up visits as told by your health care provider. This is important. °Contact a health care provider if: °You are still sleepy or having trouble with balance after 24 hours. °You feel light-headed. °You keep feeling nauseous or you keep vomiting. °You develop a rash. °You have a fever. °You have redness or swelling around the IV site. °Get help right away if: °You have trouble breathing. °You have new-onset confusion at home. °Summary °After the procedure, it is common to feel sleepy, have impaired judgment, or feel nauseous if you eat  too soon. °Rest after you get home. Know the things you should not do after the procedure. °Follow the diet recommended by your health care provider and drink enough fluid to keep your urine pale yellow. °Get help right away if you have trouble breathing or new-onset confusion at home. °This information is not intended to replace advice given to you by your health care provider. Make sure you discuss any questions you have with your healthcare provider. °Document Revised: 05/24/2019 Document Reviewed: 12/20/2018 °Elsevier Patient Education © 2022 [employer].  °

## 2021-02-09 NOTE — Progress Notes (Signed)
Called about bleeding at right neck incision.  Bandage was removed and there was oozing from site and Dermabond was partially removed.  Held pressure at incision until bleeding stopped.  Prepped skin with Chlorhexidine and applied new Dermabond at incision.  Placed a new sterile dressing.  Will give patient IV ancef 2 gm.

## 2021-02-09 NOTE — H&P (Signed)
Referring Physician(s): Katragadda,Sreedhar  Supervising Physician: Henn,A  Patient Status:  [facility]  Chief Complaint:  "I'm here for a port a cath"  Subjective: Pt known to IR service from BM bx on 11/18/20. She has a hx of CKD and MPL positive MPN . She has poor venous access and presents today for port a cath placement to assist with treatment/blood draws. She denies fever,HA,CP,dyspnea, abd pain,N/V or bleeding. She does have fatigue and back pain. Additional hx as below.   Past Medical History:  Diagnosis Date   Acute cholangitis    Allergy    Anemia    Anxiety    Arthritis    Phreesia 03/18/2020   Barrett's esophagus    Cataract    Chronic back pain    Chronic neck pain    CKD (chronic kidney disease) stage 3, GFR 30-59 ml/min ([place]) 10/29/2020   Depression    Genital herpes    GERD (gastroesophageal reflux disease)    H/O degenerative disc disease    History of blood transfusion    Hypertension    Insomnia    Lupus (systemic lupus erythematosus) ([place])    Neuromuscular disorder ([place])    Osteoarthritis    S/P colonoscopy June 2005   normal, no polyps   S/P endoscopy June 2005, Oct 2009   2005: short-segment Barrett's, 2009: short-segment Barrett's   UTI (lower urinary tract infection) 11/2012      Allergies: Bee venom, Acetaminophen, Amlodipine, Tyloxapol, Other, and Oxycodone-acetaminophen  Medications: Prior to Admission medications   Medication Sig Start Date End Date Taking? Authorizing Provider  allopurinol (ZYLOPRIM) 300 MG tablet Take 1 tablet (300 mg total) by mouth daily. 11/30/20  Yes Derek Jack, MD  ALPRAZolam Duanne Moron) 0.5 MG tablet TAKE 1 TABLET BY MOUTH AT BEDTIME 02/02/21  Yes Lindell Spar, MD  hydrochlorothiazide (HYDRODIURIL) 25 MG tablet Take 1 tablet by mouth once daily 12/28/20  Yes [name], MD  losartan (COZAAR) 25 MG tablet Take 1 tablet by mouth once daily for blood pressure 01/11/21  Yes [name],  MD  meloxicam (MOBIC) 7.5 MG tablet Take 1 tablet (7.5 mg total) by mouth daily as needed for pain (knee pain). 01/28/21  Yes Carole Civil, MD  omeprazole (PRILOSEC) 40 MG capsule Take by mouth daily. 12/16/20  Yes [provider]  pantoprazole (PROTONIX) 40 MG tablet TAKE 1 TABLET BY MOUTH ONCE DAILY BEFORE BREAKFAST 10/02/20  Yes Mahala Menghini, PA-C  temazepam (RESTORIL) 30 MG capsule Take 1 capsule by mouth at bedtime 02/03/21  Yes Lindell Spar, MD  tiZANidine (ZANAFLEX) 4 MG tablet Take 8 mg by mouth 3 (three) times daily as needed (spasms). 08/05/19  Yes [name], MD  valsartan (DIOVAN) 80 MG tablet Take by mouth. 02/25/15  Yes [provider]  acyclovir (ZOVIRAX) 400 MG tablet Take 1 tablet (400 mg total) by mouth 2 (two) times daily. 07/30/20   [name], MD  dicyclomine (BENTYL) 10 MG capsule Take 1 capsule (10 mg total) by mouth 2 (two) times daily as needed for spasms. 12/09/20   Derek Jack, MD  EPINEPHRINE 0.3 mg/0.3 mL IJ SOAJ injection INJECT 0.3 MLS INTO MUSCLE ONCE AS NEEDED 10/04/16   [name], MD  gabapentin (NEURONTIN) 300 MG capsule TAKE 1 CAPSULE BY MOUTH ONCE DAILY FOR  CHRONIC  PAIN Patient taking differently: TAKE 1 CAPSULE BY MOUTH ONCE DAILY FOR  CHRONIC  PAIN 03/11/20   [name],  MD  lidocaine-prilocaine (EMLA) cream Apply 1 application topically as needed. 02/09/21   Derek Jack, MD  Oxycodone HCl 10 MG TABS Take 1 tablet (10 mg total) by mouth 4 (four) times daily. Patient not taking: Reported on 02/03/2021 02/23/16   [name], MD  potassium chloride (KLOR-CON) 10 MEQ tablet     [provider]  prednisoLONE acetate (PRED FORTE) 1 % ophthalmic suspension Place into the right eye. Patient not taking: Reported on 01/21/2021 12/30/20   [provider]  Probiotic Product (EQL DAILY PROBIOTIC PO) Take 1 capsule by mouth daily. Patient not taking: Reported on  01/21/2021    [provider]  ruxolitinib phosphate (JAKAFI) 10 MG tablet Take 1 tablet (10 mg total) by mouth 2 (two) times daily. 02/03/21   Derek Jack, MD  topiramate (TOPAMAX) 50 MG tablet     [provider]     Vital Signs: BP (!) 110/59    Pulse 83    Temp 98.6 F (37 C) (Oral)    Resp 16    Ht 5\' 4"  (1.626 m)    Wt 114 lb (51.7 kg)    SpO2 96%    BMI 19.57 kg/m   Physical Exam awake/alert; chest- CTA bilat; heart- RRR; abd- soft,+BS, some mild LUQ tenderness; no LE edema  Imaging: No results found.  Labs:  CBC: Recent Labs    11/18/20 0955 01/12/21 1225 01/21/21 1209 02/02/21 1314  WBC 81.2* 103.7* 101.8* 166.3*  HGB 9.3* 8.5* 8.6* 7.1*  HCT 27.9* 25.8* 26.9* 22.1*  PLT 237 327 341 193    COAGS: Recent Labs    11/06/20 1919  INR 1.4*  APTT 36    BMP: Recent Labs    04/28/20 1121 05/27/20 1632 10/05/20 0959 10/21/20 1135 11/06/20 1934 01/21/21 1209  NA 136   < > 141 140 140 140  K 3.8   < > 4.1 4.1 4.2 3.5  CL 102   < > 100 102 107 105  CO2 26   < > 21 19* 25 24  GLUCOSE 110*   < > 85 65 79 114*  BUN 24*   < > 17 22 17  25*  CALCIUM 8.6*   < > 8.1* 9.1 8.7* 8.7*  CREATININE 1.11*   < > 1.39* 1.56* 1.37* 1.55*  GFRNONAA 52*  --   --   --  41* 35*   < > = values in this interval not displayed.    LIVER FUNCTION TESTS: Recent Labs    10/05/20 0959 10/21/20 1135 11/06/20 1934 01/21/21 1209  BILITOT 0.4 0.3 0.7 0.6  AST 39 41* 33 35  ALT 15 14 15 15   ALKPHOS 196* 194* 186* 173*  PROT 7.9 7.8 8.6* 8.6*  ALBUMIN 3.9 3.9 3.9 4.0    Assessment and Plan: Pt known to IR service from BM bx on 11/18/20. She has a hx of CKD and MPL positive MPN . She has poor venous access and presents today for port a cath placement to assist with treatment/blood draws.Risks and benefits of image guided port-a-catheter placement was discussed with the patient including, but not limited to bleeding, infection, pneumothorax, or fibrin  sheath development and need for additional procedures.  All of the patient's questions were answered, patient is agreeable to proceed. Consent signed and in chart.    Electronically Signed: D. Rowe Robert, PA-C 02/09/2021, 12:58 PM   I spent a total of 15 Minutes at the the patient's bedside AND on the  patient's hospital floor or unit, greater than 50% of which was counseling/coordinating care for port a cath placement

## 2021-02-09 NOTE — Procedures (Signed)
Interventional Radiology Procedure:   Indications: MPL positive myeloproliferative neoplasm and poor venous access  Procedure: Port placement  Findings: Right jugular port, tip at SVC/RA junction  Complications: None     EBL: Minimal, less than 10 ml  Plan: Discharge in one hour.  Keep port site and incisions dry for at least 24 hours.     Najmo Pardue R. Anselm Pancoast, MD  Pager: [phone]

## 2021-02-10 ENCOUNTER — Other Ambulatory Visit (HOSPITAL_COMMUNITY): Payer: Self-pay

## 2021-02-10 NOTE — Telephone Encounter (Signed)
Patient to stop by [facility] on 1/5 to sign [employer] application for [facility] assistance.

## 2021-02-11 DIAGNOSIS — E1129 Type 2 diabetes mellitus with other diabetic kidney complication: Secondary | ICD-10-CM | POA: Diagnosis not present

## 2021-02-11 DIAGNOSIS — D471 Chronic myeloproliferative disease: Secondary | ICD-10-CM | POA: Diagnosis not present

## 2021-02-11 DIAGNOSIS — N189 Chronic kidney disease, unspecified: Secondary | ICD-10-CM | POA: Diagnosis not present

## 2021-02-11 DIAGNOSIS — D638 Anemia in other chronic diseases classified elsewhere: Secondary | ICD-10-CM | POA: Diagnosis not present

## 2021-02-11 DIAGNOSIS — E1122 Type 2 diabetes mellitus with diabetic chronic kidney disease: Secondary | ICD-10-CM | POA: Diagnosis not present

## 2021-02-11 DIAGNOSIS — I9589 Other hypotension: Secondary | ICD-10-CM | POA: Diagnosis not present

## 2021-02-11 DIAGNOSIS — R809 Proteinuria, unspecified: Secondary | ICD-10-CM | POA: Diagnosis not present

## 2021-02-12 ENCOUNTER — Telehealth: Payer: Self-pay | Admitting: Pharmacy Technician

## 2021-02-12 NOTE — Telephone Encounter (Signed)
Oral Oncology Patient Advocate Encounter  Patient stopped by [facility] to complete application for [employer] in an effort to reduce patient's out of pocket expense for Jakafi to $0.    Application completed and faxed to [phone].   [employer] patient assistance phone number for follow up is [phone].   This encounter will be updated until final determination.   [place] Patient [place] Phone [phone] Fax [phone] 02/12/2021 9:32 AM

## 2021-02-15 ENCOUNTER — Ambulatory Visit (HOSPITAL_COMMUNITY): Payer: Medicare Other | Admitting: Hematology

## 2021-02-16 ENCOUNTER — Telehealth (HOSPITAL_COMMUNITY): Payer: Self-pay | Admitting: Pharmacist

## 2021-02-16 ENCOUNTER — Other Ambulatory Visit (HOSPITAL_COMMUNITY): Payer: Self-pay

## 2021-02-16 NOTE — Telephone Encounter (Signed)
Oral Chemotherapy Pharmacist Encounter  Successfully enrolled patient for copayment assistance funds from [employer] from the Myeloproliferative Neoplasms fund.  Award amount: $7,000 Effective dates: 02/16/21 - 02/16/22 ID: [id number] BIN: [id number] Group: [id number] PCN: [id number]  Billing information will be shared with [facility]. I will place a copy of the award letter to be scanned into patient's chart.  Darl Pikes, PharmD, BCPS Hematology/Oncology Clinical Pharmacist [facility] [phone]  02/16/2021 1:03 PM

## 2021-02-16 NOTE — Telephone Encounter (Signed)
Oral Chemotherapy Pharmacist Encounter  We were able to obtain a grant today to cover the cost of her Jakafi. Medication will be delivered by 02/18/21. Patient knows to get started once she has medication in hand.   Patient Education I spoke with patient for overview of new oral chemotherapy medication: Jakafi (ruxolitinib) for the treatment of MPL positive myeloproliferative neoplasm, planned duration until disease progression or unacceptable drug toxicity.   Pt is doing well. Counseled patient on administration, dosing, side effects, monitoring, drug-food interactions, safe handling, storage, and disposal. Patient will take 1 tablet (10 mg total) by mouth 2 (two) times daily.  Side effects include but not limited to: decrease hgb/plt.    Reviewed with patient importance of keeping a medication schedule and plan for any missed doses.  After discussion with patient no patient barriers to medication adherence identified.   [name] voiced understanding and appreciation. All questions answered. Medication handout provided.  Provided patient with [facility] phone number. Patient knows to call the office with questions or concerns. Oral Chemotherapy [facility] will continue to follow.  Darl Pikes, PharmD, BCPS, BCOP, CPP Hematology/Oncology Clinical Pharmacist Practitioner [place]/[facility]/AP [facility] [phone]  02/16/2021 3:02 PM

## 2021-02-19 ENCOUNTER — Ambulatory Visit (INDEPENDENT_AMBULATORY_CARE_PROVIDER_SITE_OTHER): Payer: Medicare Other | Admitting: *Deleted

## 2021-02-19 ENCOUNTER — Other Ambulatory Visit: Payer: Self-pay | Admitting: Family Medicine

## 2021-02-19 DIAGNOSIS — R52 Pain, unspecified: Secondary | ICD-10-CM

## 2021-02-19 DIAGNOSIS — B009 Herpesviral infection, unspecified: Secondary | ICD-10-CM

## 2021-02-19 DIAGNOSIS — F418 Other specified anxiety disorders: Secondary | ICD-10-CM

## 2021-02-19 DIAGNOSIS — F331 Major depressive disorder, recurrent, moderate: Secondary | ICD-10-CM

## 2021-02-19 NOTE — Patient Instructions (Signed)
Visit Information  Thank you for taking time to visit with me today. Please don't hesitate to contact me if I can be of assistance to you before our next scheduled telephone appointment.  Following are the goals we discussed today:  - call Ray at [phone] to schedule an appointment at her convenience- keep 90 percent of counseling appointments - discuss Psychiatry referral with PCP   Our next appointment is by telephone on 03/24/21    Please call the care guide team at [phone] if you need to cancel or reschedule your appointment.   If you are experiencing a Mental Health or [place] or need someone to talk to, please call the Suicide and Crisis Lifeline: 988 call the [place] National Suicide Prevention Lifeline: 7088805018 or TTY: 408 240 3109 TTY [phone]) to talk to a trained counselor go to [facility] [address] [place] [phone]) call the [place]: [phone] call 911   Patient verbalizes understanding of instructions and care plan provided today and agrees to view in [place]. Active MyChart status confirmed with patient.    Eduard Clos MSW, LCSW Licensed Clinical [facility]

## 2021-02-19 NOTE — Chronic Care Management (AMB) (Signed)
Chronic Care Management    Clinical Social Work Note  02/19/2021 Name: Renee Waters MRN: [id number] DOB: July 19, 1945  Renee Waters is a 76 y.o. year old female who is a primary care patient of [name], MD. The CCM team was consulted to assist the patient with chronic disease management and/or care coordination needs related to: Appointment Scheduling needs and [place] and Resources.   Engaged with patient by telephone for follow up visit in response to provider referral for social work chronic care management and care coordination services.   Consent to Services:  The patient was given information about Chronic Care Management services, agreed to services, and gave verbal consent prior to initiation of services.  Please see initial visit note for detailed documentation.   Patient agreed to services and consent obtained.   Assessment: Review of patient past medical history, allergies, medications, and health status, including review of relevant consultants reports was performed today as part of a comprehensive evaluation and provision of chronic care management and care coordination services.     SDOH (Social Determinants of Health) assessments and interventions performed:    Advanced Directives Status: Not addressed in this encounter.  CCM Care Plan  Allergies  Allergen Reactions   Bee Venom Swelling and Hives   Acetaminophen     itches   Amlodipine     Hair loss   Tyloxapol    Other Itching, Rash and Swelling   Oxycodone-Acetaminophen Rash    Pt states, "this gives her a rash, but at home she takes oxycodone for pain relief" Pt states, "this gives her a rash, but at home she takes oxycodone for pain relief"    Outpatient Encounter Medications as of 02/19/2021  Medication Sig Note   acyclovir (ZOVIRAX) 400 MG tablet Take 1 tablet (400 mg total) by mouth 2 (two) times daily. 10/28/2020: Pt reports taking med as needed.    allopurinol (ZYLOPRIM)  300 MG tablet Take 1 tablet (300 mg total) by mouth daily.    ALPRAZolam (XANAX) 0.5 MG tablet TAKE 1 TABLET BY MOUTH AT BEDTIME    dicyclomine (BENTYL) 10 MG capsule Take 1 capsule (10 mg total) by mouth 2 (two) times daily as needed for spasms.    EPINEPHRINE 0.3 mg/0.3 mL IJ SOAJ injection INJECT 0.3 MLS INTO MUSCLE ONCE AS NEEDED    gabapentin (NEURONTIN) 300 MG capsule TAKE 1 CAPSULE BY MOUTH ONCE DAILY FOR  CHRONIC  PAIN (Patient taking differently: TAKE 1 CAPSULE BY MOUTH ONCE DAILY FOR  CHRONIC  PAIN) 10/28/2020: Pt reports taking med as needed when she has herpes breakout.    hydrochlorothiazide (HYDRODIURIL) 25 MG tablet Take 1 tablet by mouth once daily    lidocaine-prilocaine (EMLA) cream Apply 1 application topically as needed. 02/09/2021: Not started yet   losartan (COZAAR) 25 MG tablet Take 1 tablet by mouth once daily for blood pressure    meloxicam (MOBIC) 7.5 MG tablet Take 1 tablet (7.5 mg total) by mouth daily as needed for pain (knee pain).    omeprazole (PRILOSEC) 40 MG capsule Take by mouth daily.    Oxycodone HCl 10 MG TABS Take 1 tablet (10 mg total) by mouth 4 (four) times daily. (Patient not taking: Reported on 02/03/2021)    pantoprazole (PROTONIX) 40 MG tablet TAKE 1 TABLET BY MOUTH ONCE DAILY BEFORE BREAKFAST    potassium chloride (KLOR-CON) 10 MEQ tablet  02/09/2021: States not taking   prednisoLONE acetate (PRED FORTE) 1 % ophthalmic suspension Place into  the right eye. (Patient not taking: Reported on 01/21/2021)    Probiotic Product (EQL DAILY PROBIOTIC PO) Take 1 capsule by mouth daily. (Patient not taking: Reported on 01/21/2021)    ruxolitinib phosphate (JAKAFI) 10 MG tablet Take 1 tablet (10 mg total) by mouth 2 (two) times daily. 02/09/2021: Not started yet.   temazepam (RESTORIL) 30 MG capsule Take 1 capsule by mouth at bedtime    tiZANidine (ZANAFLEX) 4 MG tablet Take 8 mg by mouth 3 (three) times daily as needed (spasms).    topiramate (TOPAMAX) 50 MG tablet      valsartan (DIOVAN) 80 MG tablet Take by mouth.    No facility-administered encounter medications on file as of 02/19/2021.    Patient Active Problem List   Diagnosis Date Noted   Influenza-like symptoms 01/29/2021   Fever 01/21/2021   Leukocytosis 11/09/2020   CKD (chronic kidney disease) stage 3, GFR 30-59 ml/min (HCC) 10/29/2020   Dermatomycosis 08/02/2020   HSV-2 (herpes simplex virus 2) infection 08/02/2020   Sore throat 07/14/2020   Upper respiratory tract infection 07/09/2020   Rib pain on left side 03/21/2020   Generalized pruritus 03/21/2020   Mildly underweight adult 09/19/2019   MDD (major depressive disorder), recurrent, in full remission ([place]) 03/27/2019   Educated about COVID-19 virus infection 06/24/2018   MDD (major depressive disorder), recurrent episode, moderate ([place]) 10/28/2016   Thoracic spine pain 08/31/2016   Anemia, normocytic normochromic 05/27/2015   IDA (iron deficiency anemia) 03/02/2015   Unsteady gait 02/25/2015   Mucosal abnormality of esophagus    Systemic lupus ([place]) 10/31/2012   Vitamin D deficiency 03/16/2012   Generalized pain 03/15/2012   Constipation 10/06/2011   Hip pain 06/29/2011   Barrett's esophagus 12/13/2010   Western blot positive HSV2 06/22/2007   Depression 06/22/2007   Essential hypertension 06/22/2007   GERD (gastroesophageal reflux disease) 06/22/2007   DJD (degenerative joint disease) 06/22/2007   NECK PAIN, CHRONIC 06/22/2007   Chronic midline low back pain with sciatica 06/22/2007   Insomnia secondary to depression with anxiety 06/22/2007    Conditions to be addressed/monitored: Depression; Mental Health Concerns   Care Plan : LCSW Plan of Care  Updates made by Deirdre Peer, LCSW since 02/19/2021 12:00 AM     Problem: Symptoms (Depression)      Long-Range Goal: Symptoms Monitored and Managed   Start Date: 12/17/2020  Expected End Date: 04/06/2021  This Visit's Progress: On track  Recent Progress: On track   Priority: High  Note:   Current Barriers:  Chronic Mental Health needs related to depression, anxiety Mental Health Concerns  Suicidal Ideation/Homicidal Ideation: No  Clinical Social Work Goal(s):  patient will work with SW  as needed  by telephone  to reduce or manage symptoms related to depression demonstrate a reduction in symptoms related to :Anxiety and Depression   Interventions: 02/19/21- Pt reports having a lot going on and has not followed up on the counseling referral made- CSW provided pt with the # to call. Pt reports her depression has been better- "my family keeps me on my toes and keeps me positive".   Pt plans to discuss Psychiatry referral with PCP- wants their input/referral-  seeing PCP soon she states.    01/20/21-Pt is awaiting coordination by [facility] for counseling- will inquire on updates. Pt reports being sick and not feeling well- planning for tele-health visit with PCP CSW spoke with pt today by phone who reports a history of depression- she denies any current or past  SI/HI and indicates her husband of 41 years and her adult children are a strong support for her. Pt was on RX (Cymbalta 20mg  BID) about one year ago and lost her providers Estate agent) when they moved out of the office in [place].  Pt is open to getting connected with a new Psychiatrist and counselor- prefers local in-person counseling.   CSW completed a PHQ9 Depression Screening with pt today; pt scoring a  "12" today (moderately severe category). Pt admits to losing weight and having difficulty with sleep- does have Xanax which she takes PRN. Depression screen [facility] 2/9 12/17/2020 10/28/2020 10/13/2020 10/13/2020 07/30/2020  Decreased Interest 3 1 0 0 0  Down, Depressed, Hopeless 1 2 1 1 1   PHQ - 2 Score 4 3 1 1 1   Altered sleeping 3 0 - - -  Tired, decreased energy 1 2 - - -  Change in appetite 3 2 - - -  Feeling bad or failure about yourself  0 3 - - -  Trouble concentrating 0 0 - -  -  Moving slowly or fidgety/restless 1 0 - - -  Suicidal thoughts 0 0 - - -  PHQ-9 Score 12 10 - - -  Difficult doing work/chores Somewhat difficult Somewhat difficult - - -  Some recent data might be hidden      Patient interviewed and appropriate assessments performed: PHQ 9 1:1 collaboration with [name], MD regarding development and update of comprehensive plan of care as evidenced by provider attestation and co-signature Patient interviewed and appropriate assessments performed Provided mental health counseling with regard to treatment plans/needs  v Provided patient with information about mental health 988 hotline Discussed plans with patient for ongoing care management follow up and provided patient with direct contact information for care management team Referred patient to [facility] for linking pt with Psychiatrist and long term follow up  therapy/counseling Depression screen reviewed  PHQ2/ PHQ9 completed Solution-Focused Strategies Active listening / Reflection utilized  Emotional Support Provided Problem Solving [place] strategies reviewed Provided psychoeducation for mental health needs   Patient Self Care Activities:  Self administers medications as prescribed Attends church or other social activities Performs ADL's independently Calls provider office for new concerns or questions Ability for insight Independent living Motivation for treatment Strong family or social support  Patient Coping Strengths:  Supportive Relationships Family Friends Hopefulness Self Advocate Able to Communicate Effectively  Patient Self Care Deficits:  No current provider for Psychiatry or counseling    Patient Goals:  - call [place] at [phone] to schedule an appointment at her convenience- keep 90 percent of counseling appointments - discuss Psychiatry referral with PCP  - avoid negative self-talk - develop a personal safety plan - have a plan for  how to handle bad days - journal feelings and what helps to feel better or worse - spend time or talk with others every day - watch for early signs of feeling worse - begin personal counseling - start or continue a personal journal - practice positive thinking and self-talk - call to cancel if needed - keep a calendar with prescription refill dates - keep a calendar with appointment dates  Follow Up Plan: Appointment scheduled for SW follow up with client by phone on:  03/24/21      Follow Up Plan: Appointment scheduled for SW follow up with client by phone on: 03/24/21      Eduard Clos MSW, LCSW Licensed Clinical Social Education officer, environmental Primary Care [phone]

## 2021-02-25 ENCOUNTER — Inpatient Hospital Stay (HOSPITAL_COMMUNITY): Payer: Medicare Other

## 2021-02-25 ENCOUNTER — Other Ambulatory Visit: Payer: Self-pay

## 2021-02-25 ENCOUNTER — Inpatient Hospital Stay (HOSPITAL_COMMUNITY): Payer: Medicare Other | Attending: Hematology | Admitting: Hematology

## 2021-02-25 ENCOUNTER — Ambulatory Visit: Payer: Medicare Other | Admitting: *Deleted

## 2021-02-25 VITALS — BP 139/64 | HR 92 | Temp 96.2°F | Resp 18 | Ht 64.0 in | Wt 119.4 lb

## 2021-02-25 DIAGNOSIS — Z87898 Personal history of other specified conditions: Secondary | ICD-10-CM

## 2021-02-25 DIAGNOSIS — Z79899 Other long term (current) drug therapy: Secondary | ICD-10-CM | POA: Diagnosis not present

## 2021-02-25 DIAGNOSIS — I10 Essential (primary) hypertension: Secondary | ICD-10-CM

## 2021-02-25 DIAGNOSIS — E790 Hyperuricemia without signs of inflammatory arthritis and tophaceous disease: Secondary | ICD-10-CM | POA: Insufficient documentation

## 2021-02-25 DIAGNOSIS — D72829 Elevated white blood cell count, unspecified: Secondary | ICD-10-CM

## 2021-02-25 DIAGNOSIS — R10811 Right upper quadrant abdominal tenderness: Secondary | ICD-10-CM | POA: Diagnosis not present

## 2021-02-25 DIAGNOSIS — R61 Generalized hyperhidrosis: Secondary | ICD-10-CM | POA: Diagnosis not present

## 2021-02-25 DIAGNOSIS — C946 Myelodysplastic disease, not classified: Secondary | ICD-10-CM | POA: Insufficient documentation

## 2021-02-25 DIAGNOSIS — N189 Chronic kidney disease, unspecified: Secondary | ICD-10-CM | POA: Diagnosis not present

## 2021-02-25 DIAGNOSIS — Z95828 Presence of other vascular implants and grafts: Secondary | ICD-10-CM

## 2021-02-25 DIAGNOSIS — Z87891 Personal history of nicotine dependence: Secondary | ICD-10-CM | POA: Diagnosis not present

## 2021-02-25 DIAGNOSIS — R52 Pain, unspecified: Secondary | ICD-10-CM

## 2021-02-25 LAB — CBC WITH DIFFERENTIAL/PLATELET
Band Neutrophils: 10 %
Basophils Absolute: 1.4 K/uL — ABNORMAL HIGH (ref 0.0–0.1)
Basophils Relative: 1 %
Blasts: 2 %
Eosinophils Absolute: 4.2 K/uL — ABNORMAL HIGH (ref 0.0–0.5)
Eosinophils Relative: 3 %
HCT: 19.5 % — ABNORMAL LOW (ref 36.0–46.0)
Hemoglobin: 6.4 g/dL — CL (ref 12.0–15.0)
Lymphocytes Relative: 2 %
Lymphs Abs: 2.8 K/uL (ref 0.7–4.0)
MCH: 32.7 pg (ref 26.0–34.0)
MCHC: 32.8 g/dL (ref 30.0–36.0)
MCV: 99.5 fL (ref 80.0–100.0)
Metamyelocytes Relative: 13 %
Monocytes Absolute: 12.6 K/uL — ABNORMAL HIGH (ref 0.1–1.0)
Monocytes Relative: 9 %
Myelocytes: 4 %
Neutro Abs: 78.2 K/uL — ABNORMAL HIGH (ref 1.7–7.7)
Neutrophils Relative %: 46 %
Platelets: 187 K/uL (ref 150–400)
Promyelocytes Relative: 10 %
RBC: 1.96 MIL/uL — ABNORMAL LOW (ref 3.87–5.11)
RDW: 23.9 % — ABNORMAL HIGH (ref 11.5–15.5)
WBC: 139.6 K/uL (ref 4.0–10.5)
nRBC: 2.8 % — ABNORMAL HIGH (ref 0.0–0.2)

## 2021-02-25 LAB — COMPREHENSIVE METABOLIC PANEL
ALT: 15 U/L (ref 0–44)
AST: 37 U/L (ref 15–41)
Albumin: 3.3 g/dL — ABNORMAL LOW (ref 3.5–5.0)
Alkaline Phosphatase: 179 U/L — ABNORMAL HIGH (ref 38–126)
Anion gap: 8 (ref 5–15)
BUN: 25 mg/dL — ABNORMAL HIGH (ref 8–23)
CO2: 24 mmol/L (ref 22–32)
Calcium: 8.3 mg/dL — ABNORMAL LOW (ref 8.9–10.3)
Chloride: 110 mmol/L (ref 98–111)
Creatinine, Ser: 1.32 mg/dL — ABNORMAL HIGH (ref 0.44–1.00)
GFR, Estimated: 42 mL/min — ABNORMAL LOW (ref >60)
Glucose, Bld: 114 mg/dL — ABNORMAL HIGH (ref 70–99)
Potassium: 3.5 mmol/L (ref 3.5–5.1)
Sodium: 142 mmol/L (ref 135–145)
Total Bilirubin: 0.5 mg/dL (ref 0.3–1.2)
Total Protein: 6.7 g/dL (ref 6.5–8.1)

## 2021-02-25 LAB — PREPARE RBC (CROSSMATCH)

## 2021-02-25 LAB — SAMPLE TO BLOOD BANK

## 2021-02-25 LAB — PHOSPHORUS: Phosphorus: 3.6 mg/dL (ref 2.5–4.6)

## 2021-02-25 LAB — URIC ACID: Uric Acid, Serum: 3.4 mg/dL (ref 2.5–7.1)

## 2021-02-25 LAB — LACTATE DEHYDROGENASE: LDH: 838 U/L — ABNORMAL HIGH (ref 98–192)

## 2021-02-25 MED ORDER — SODIUM CHLORIDE 0.9% FLUSH
10.0000 mL | Freq: Once | INTRAVENOUS | Status: AC
  Administered 2021-02-25: 10 mL via INTRAVENOUS

## 2021-02-25 MED ORDER — HEPARIN SOD (PORK) LOCK FLUSH 100 UNIT/ML IV SOLN
500.0000 [IU] | Freq: Once | INTRAVENOUS | Status: AC
  Administered 2021-02-25: 500 [IU] via INTRAVENOUS

## 2021-02-25 NOTE — Patient Instructions (Signed)
Visit Information  Thank you for taking time to visit with me today. Please don't hesitate to contact me if I can be of assistance to you before our next scheduled telephone appointment.  Following are the goals we discussed today:  Take all medications as prescribed Attend all scheduled provider appointments Call provider office for new concerns or questions  Work with the social worker to address care coordination needs and will continue to work with the clinical team to address health care and disease management related needs check blood pressure daily keep a blood pressure log take blood pressure log to all doctor appointments call doctor for signs and symptoms of high blood pressure take medications for blood pressure exactly as prescribed report new symptoms to your doctor eat more whole grains, fruits and vegetables, lean meats and healthy fats Try to get outside in the sunshine daily Try doing some type of exercise daily, walking is good Practice good handwashing, wear a mask as needed Alternate activity with rest  Our next appointment is telephone outreach on 05/13/21 at 3 pm  Please call the care guide team at [phone] if you need to cancel or reschedule your appointment.   If you are experiencing a Mental Health or [place] or need someone to talk to, please call the Suicide and Crisis Lifeline: 988 call the [place] National Suicide Prevention Lifeline: 587-391-5996 or TTY: 520-011-0713 TTY (925) 098-9779) to talk to a trained counselor call 1-800-273-TALK (toll free, 24 hour hotline) go to [facility] [address] [place] [phone]) call the [place]: [phone] call 911   Patient verbalizes understanding of instructions and care plan provided today and agrees to view in [place]. Active MyChart status confirmed with patient.    Jacqlyn Larsen [facility], BSN RN Case Manager [facility] [phone]

## 2021-02-25 NOTE — Progress Notes (Signed)
Patient presents today for labs and appointment with [name]. Patient's Hemoglobin 6.4, patient wishes to come back tomorrow for blood transfusion. Port flushed with good blood return noted. No bruising or swelling at site. Bandaid applied and patient discharged in satisfactory condition. VVS stable with no signs or symptoms of distressed noted.

## 2021-02-25 NOTE — Chronic Care Management (AMB) (Signed)
Chronic Care Management   CCM RN Visit Note  02/25/2021 Name: Renee Waters MRN: [id number] DOB: 04-Aug-1945  Subjective: Renee Waters is a 76 y.o. year old female who is a primary care patient of [name], MD. The care management team was consulted for assistance with disease management and care coordination needs.    Engaged with patient by telephone for follow up visit in response to provider referral for case management and/or care coordination services.   Consent to Services:  The patient was given information about Chronic Care Management services, agreed to services, and gave verbal consent prior to initiation of services.  Please see initial visit note for detailed documentation.   Patient agreed to services and verbal consent obtained.   Assessment: Review of patient past medical history, allergies, medications, health status, including review of consultants reports, laboratory and other test data, was performed as part of comprehensive evaluation and provision of chronic care management services.   SDOH (Social Determinants of Health) assessments and interventions performed:    CCM Care Plan  Allergies  Allergen Reactions   Bee Venom Swelling and Hives   Acetaminophen     itches   Amlodipine     Hair loss   Tyloxapol    Other Itching, Rash and Swelling   Oxycodone-Acetaminophen Rash    Pt states, "this gives her a rash, but at home she takes oxycodone for pain relief" Pt states, "this gives her a rash, but at home she takes oxycodone for pain relief"    Outpatient Encounter Medications as of 02/25/2021  Medication Sig Note   acyclovir (ZOVIRAX) 400 MG tablet Take 1 tablet (400 mg total) by mouth 2 (two) times daily. 10/28/2020: Pt reports taking med as needed.    allopurinol (ZYLOPRIM) 300 MG tablet Take 1 tablet (300 mg total) by mouth daily.    ALPRAZolam (XANAX) 0.5 MG tablet TAKE 1 TABLET BY MOUTH AT BEDTIME    gabapentin (NEURONTIN) 300 MG capsule  TAKE 1 CAPSULE BY MOUTH ONCE DAILY FOR  CHRONIC  PAIN (Patient taking differently: TAKE 1 CAPSULE BY MOUTH ONCE DAILY FOR  CHRONIC  PAIN) 10/28/2020: Pt reports taking med as needed when she has herpes breakout.    hydrochlorothiazide (HYDRODIURIL) 25 MG tablet Take 1 tablet by mouth once daily    lidocaine-prilocaine (EMLA) cream Apply 1 application topically as needed. 02/09/2021: Not started yet   losartan (COZAAR) 25 MG tablet Take 1 tablet by mouth once daily for blood pressure    meloxicam (MOBIC) 7.5 MG tablet Take 1 tablet (7.5 mg total) by mouth daily as needed for pain (knee pain).    Oxycodone HCl 10 MG TABS Take 1 tablet (10 mg total) by mouth 4 (four) times daily.    pantoprazole (PROTONIX) 40 MG tablet TAKE 1 TABLET BY MOUTH ONCE DAILY BEFORE BREAKFAST    prednisoLONE acetate (PRED FORTE) 1 % ophthalmic suspension Place into the right eye.    Probiotic Product (EQL DAILY PROBIOTIC PO) Take 1 capsule by mouth daily.    ruxolitinib phosphate (JAKAFI) 10 MG tablet Take 1 tablet (10 mg total) by mouth 2 (two) times daily. 02/09/2021: Not started yet.   temazepam (RESTORIL) 30 MG capsule Take 1 capsule by mouth at bedtime    tiZANidine (ZANAFLEX) 4 MG tablet Take 8 mg by mouth 3 (three) times daily as needed (spasms).    valsartan (DIOVAN) 80 MG tablet Take by mouth.    EPINEPHRINE 0.3 mg/0.3 mL IJ SOAJ injection INJECT 0.3 MLS  INTO MUSCLE ONCE AS NEEDED (Patient not taking: Reported on 02/25/2021)    potassium chloride (KLOR-CON) 10 MEQ tablet  (Patient not taking: Reported on 02/25/2021) 02/09/2021: States not taking   No facility-administered encounter medications on file as of 02/25/2021.    Patient Active Problem List   Diagnosis Date Noted   Influenza-like symptoms 01/29/2021   Fever 01/21/2021   Leukocytosis 11/09/2020   CKD (chronic kidney disease) stage 3, GFR 30-59 ml/min (HCC) 10/29/2020   Dermatomycosis 08/02/2020   HSV-2 (herpes simplex virus 2) infection 08/02/2020   Sore  throat 07/14/2020   Upper respiratory tract infection 07/09/2020   Rib pain on left side 03/21/2020   Generalized pruritus 03/21/2020   Mildly underweight adult 09/19/2019   MDD (major depressive disorder), recurrent, in full remission ([place]) 03/27/2019   Educated about COVID-19 virus infection 06/24/2018   MDD (major depressive disorder), recurrent episode, moderate ([place]) 10/28/2016   Thoracic spine pain 08/31/2016   Anemia, normocytic normochromic 05/27/2015   IDA (iron deficiency anemia) 03/02/2015   Unsteady gait 02/25/2015   Mucosal abnormality of esophagus    Systemic lupus ([place]) 10/31/2012   Vitamin D deficiency 03/16/2012   Generalized pain 03/15/2012   Constipation 10/06/2011   Hip pain 06/29/2011   Barrett's esophagus 12/13/2010   Western blot positive HSV2 06/22/2007   Depression 06/22/2007   Essential hypertension 06/22/2007   GERD (gastroesophageal reflux disease) 06/22/2007   DJD (degenerative joint disease) 06/22/2007   NECK PAIN, CHRONIC 06/22/2007   Chronic midline low back pain with sciatica 06/22/2007   Insomnia secondary to depression with anxiety 06/22/2007    Conditions to be addressed/monitored:HTN and Chronic Pain  Care Plan : RN Care Manager plan of care  Updates made by Kassie Mends, RN since 02/25/2021 12:00 AM     Problem: No plan of care established for management of chronic disease states  (HTN, Chronic Pain)   Priority: High     Long-Range Goal: Development of plan of care for chronic disease management (HTN, Chronic Pain)   Start Date: 12/01/2020  Expected End Date: 08/24/2021  Priority: High  Note:   Current Barriers:  Knowledge Deficits related to plan of care for management of HTN and Chronic Pain  Knowledge Deficits related to self-health management of acute or chronic pain- pt has unmanaged chronic pain (neck, lower back and " all over" DJD). Pt reports she has pain medication which does help.  Patient reports she has "a lot of  anxiety and depression can vary" although depression seems to be improving. Patient reports she is working with CCM LCSW on management of anxiety, stress, depression. Pt reports spouse trying to cook healthy meals and she is gaining some weight back that she previously lost, pt occasionally gets out in yard, she intends to check on going to a gym for water aerobics.  Patient reports she has chronic insomnia and is on medication and this helps. Chronic Disease Management support and education needs related to chronic pain Care Coordination needs related to anxiety, stress, depression in a patient with chronic pain Chronic Disease Management support and education needs related to pain management, management of stress and anxiety.  Knowledge Deficits related to basic understanding of hypertension pathophysiology and self care management-  patient reports she lives with her spouse, has other relatives she can call on if needed, does check blood pressure daily with readings "mostly normal".  Reports having all medications and taking as prescribed, uses cane at home, has walker if needed.  Pt  reports she continues following up with oncologist for leukemia, pt reports she did receive the flu vaccine, is trying to eat healthy as possible. RNCM Clinical Goal(s):  Patient will verbalize understanding of plan for management of HTN and Chronic Pain as evidenced by patient report, review EHR and  through collaboration with RN Care manager, provider, and care team.   Interventions: 1:1 collaboration with primary care provider regarding development and update of comprehensive plan of care as evidenced by provider attestation and co-signature Inter-disciplinary care team collaboration (see longitudinal plan of care) Evaluation of current treatment plan related to  self management and patient's adherence to plan as established by provider   Hypertension Interventions:  (Status:  Goal on track:  Yes.) Long Term  Goal Last practice recorded BP readings:  BP Readings from Last 3 Encounters:  12/14/20 108/71  11/30/20 92/62  11/18/20 (!) 162/72  Most recent eGFR/CrCl:  Lab Results  Component Value Date   EGFR 35 (L) 10/21/2020    No components found for: CRCL  Reviewed medications with patient and discussed importance of compliance Counseled on the importance of exercise goals with target of 150 minutes per week Advised patient, providing education and rationale, to monitor blood pressure daily and record, calling PCP for findings outside established parameters Discussed complications of poorly controlled blood pressure such as heart disease, stroke, circulatory complications, vision complications, kidney impairment, sexual dysfunction Reviewed low sodium diet  Pain Interventions:  (Status:  Goal on track:  Yes.) Long Term Goal Pain assessment performed Medications reviewed Reviewed provider established plan for pain management Discussed importance of adherence to all scheduled medical appointments Counseled on the importance of reporting any/all new or changed pain symptoms or management strategies to pain management provider Discussed use of relaxation techniques and/or diversional activities to assist with pain reduction (distraction, imagery, relaxation, massage, acupressure, TENS, heat, and cold application Reviewed with patient prescribed pharmacological and nonpharmacological pain relief strategies Reviewed keeping stress to a minimum  Patient Goals/Self-Care Activities: Take all medications as prescribed Attend all scheduled provider appointments Call provider office for new concerns or questions  Work with the social worker to address care coordination needs and will continue to work with the clinical team to address health care and disease management related needs check blood pressure daily keep a blood pressure log take blood pressure log to all doctor appointments call doctor for  signs and symptoms of high blood pressure take medications for blood pressure exactly as prescribed report new symptoms to your doctor eat more whole grains, fruits and vegetables, lean meats and healthy fats Try to get outside in the sunshine daily Try doing some type of exercise daily, walking is good Practice good handwashing, wear a mask as needed Alternate activity with rest   Follow Up Plan:  Telephone follow up appointment with care management team member scheduled for:  05/13/2021      Plan:Telephone follow up appointment with care management team member scheduled for:  05/13/2021  Jacqlyn Larsen [facility], BSN RN Case Manager [place] [facility] [phone]

## 2021-02-25 NOTE — Progress Notes (Signed)
Renee Waters, Renee Waters   CLINIC:  Medical Oncology/Hematology  PCP:  [name], MD [address], [place] / [place] [address]  [phone]  REASON FOR VISIT:  Follow-up for MPL positive myeloproliferative neoplasm  PRIOR THERAPY: none  CURRENT THERAPY: Jakafi 10 mg twice daily  INTERVAL HISTORY:  Renee Waters, a 76 y.o. female, returns for routine follow-up for her MPL positive myeloproliferative neoplasm. Ferris was last seen on 02/03/2021.  Today she reports feeling good. She started [facility] on 01/10. Her appetite is fair and improving. She is drinking 1 Ensure a day.  She has gained 5 lbs since 01/03. She denies nausea and diarrhea. She reports well controlled constipation. She reports drenching night sweats every night since starting Jakafi. She reports a headaches occurring 3-4 times daily since starting Jakafi. She denies itching after showers. She reports soreness at her biopsy site.  REVIEW OF SYSTEMS:  Review of Systems  Constitutional:  Negative for appetite change and fatigue.  Respiratory:  Positive for cough.   Gastrointestinal:  Positive for constipation (well controlled). Negative for diarrhea and nausea.  Endocrine: Positive for hot flashes (night sweats).  Musculoskeletal:  Positive for flank pain (10/10 R side).  Skin:  Negative for itching.  Neurological:  Positive for headaches.  All other systems reviewed and are negative.  PAST MEDICAL/SURGICAL HISTORY:  Past Medical History:  Diagnosis Date   Acute cholangitis    Allergy    Anemia    Anxiety    Arthritis    Phreesia 03/18/2020   Barrett's esophagus    Cataract    Chronic back pain    Chronic neck pain    CKD (chronic kidney disease) stage 3, GFR 30-59 ml/min ([place]) 10/29/2020   Depression    Genital herpes    GERD (gastroesophageal reflux disease)    H/O degenerative disc disease    History of blood transfusion     Hypertension    Insomnia    Lupus (systemic lupus erythematosus) ([place])    Neuromuscular disorder ([place])    Osteoarthritis    S/P colonoscopy June 2005   normal, no polyps   S/P endoscopy June 2005, Oct 2009   2005: short-segment Barrett's, 2009: short-segment Barrett's   UTI (lower urinary tract infection) 11/2012   Past Surgical History:  Procedure Laterality Date   ABDOMINAL HYSTERECTOMY     BACK SURGERY     BIOPSY N/A 03/20/2014   Procedure: BIOPSY;  Surgeon: Daneil Dolin, MD;  Location: [facility];  Service: Endoscopy;  Laterality: N/A;   BIOPSY  09/14/2015   Procedure: BIOPSY;  Surgeon: Daneil Dolin, MD;  Location[facility];  Service: Endoscopy;;  esophageal and gastric   BIOPSY  07/23/2019   Procedure: BIOPSY;  Surgeon: Daneil Dolin, MD;  [facility];  Service: Endoscopy;;  esophageal    CARPAL TUNNEL RELEASE Left 2013   cervical disectomy  2002   CESAREAN SECTION N/A    Phreesia 03/18/2020   CHOLECYSTECTOMY     with lysis of adhesions for sbo; "ruptured gallbladder".   COLONOSCOPY  11/09/2011   RMR: Melanosis coli   COLONOSCOPY WITH PROPOFOL N/A 09/14/2015   Dr. Gala Romney: diverticulosis, 64m TA removed. next TCS 09/2020.    COLONOSCOPY WITH PROPOFOL N/A 04/30/2020   Procedure: COLONOSCOPY WITH PROPOFOL;  Surgeon: RDaneil Dolin MD;  [facility];  Service: Endoscopy;  Laterality: N/A;  PM (ASA 3)  DENTAL SURGERY  11/2015   multiple tooth extraction   ESOPHAGOGASTRODUODENOSCOPY  11/29/2007   salmon-colored  tongue   longest stable at  3 cm, distal esophagus as described previously status post biopsy/ Hiatal hernia, otherwise normal stomach D1 and D2   ESOPHAGOGASTRODUODENOSCOPY  01/06/11   short segment Barrett's esophagus s/p bx/Hiatal hernia   ESOPHAGOGASTRODUODENOSCOPY (EGD) WITH PROPOFOL N/A 03/20/2014   HYI:FOYDXAJO distal esophagus short segment barrett's, bx with no dysplasia. next egd in 03/2017   ESOPHAGOGASTRODUODENOSCOPY (EGD) WITH PROPOFOL  N/A 09/14/2015   Dr. Gala Romney: Barrett's without dysplasia, gastritis benign bx, hiatal hernia. next EGD 09/2018.   ESOPHAGOGASTRODUODENOSCOPY (EGD) WITH PROPOFOL N/A 07/23/2019   Procedure: ESOPHAGOGASTRODUODENOSCOPY (EGD) WITH PROPOFOL;  Surgeon: Daneil Dolin, MD;  [facility];  Service: Endoscopy;  Laterality: N/A;  3:00pm   EYE SURGERY N/A    Phreesia 03/18/2020   HERNIA REPAIR Right 07/2010   Dr. Zada Girt   IR IMAGING GUIDED PORT INSERTION  02/09/2021   JOINT REPLACEMENT     LAPAROSCOPIC CHOLECYSTECTOMY  2017   at [facility]   POLYPECTOMY  09/14/2015   Procedure: POLYPECTOMY;  Surgeon: Daneil Dolin, MD;  [facility];  Service: Endoscopy;;  ascending colon   right hip replacement  07/2010   went back in sept 2012 to fix   SHOULDER ARTHROSCOPY  2008   left   SPINE SURGERY N/A    Phreesia 03/18/2020   TOTAL HIP REVISION Right 12/17/2012   Procedure: RIGHT TOTAL HIP REVISION;  Surgeon: Mauri Pole, MD;  Location: [facility];  Service: Orthopedics;  Laterality: Right;   WRIST SURGERY Right 2011   open reduction right wrist.    SOCIAL HISTORY:  Social History   Socioeconomic History   Marital status: Married    Spouse name: louis   Number of children: 4   Years of education: 12+   Highest education level: Some college, no degree  Occupational History   Occupation: Press photographer  - retired   Occupation: non profit  Tobacco Use   Smoking status: Former    Packs/day: 0.25    Years: 25.00    Pack years: 6.25    Types: Cigarettes    Quit date: 02/07/2003    Years since quitting: 18.0   Smokeless tobacco: Never   Tobacco comments:    quit in 2004  Vaping Use   Vaping Use: Never used  Substance and Sexual Activity   Alcohol use: No   Drug use: No   Sexual activity: Not Currently    Birth control/protection: Surgical  Other Topics Concern   Not on file  Social History Narrative   Not on file   Social Determinants of Health   Financial Resource Strain:  Low Risk    Difficulty of Paying Living Expenses: Not hard at all  Food Insecurity: No Food Insecurity   Worried About Charity fundraiser in the Last Year: Never true   [place] in the Last Year: Never true  Transportation Needs: No Transportation Needs   Lack of Transportation (Medical): No   Lack of Transportation (Non-Medical): No  Physical Activity: Insufficiently Active   Days of Exercise per Week: 6 days   Minutes of Exercise per Session: 20 min  Stress: Stress Concern Present   Feeling of Stress : Very much  Social Connections: Moderately Integrated   Frequency of Communication with Friends and Family: More than three times a week   Frequency of Social Gatherings with Friends and  Family: Once a week   Attends Religious Services: More than 4 times per year   Active Member of Clubs or Organizations: No   Attends Music therapist: Never   Marital Status: Married  Human resources officer Violence: Not At Risk   Fear of Current or Ex-Partner: No   Emotionally Abused: No   Physically Abused: No   Sexually Abused: No    FAMILY HISTORY:  Family History  Problem Relation Age of Onset   Hypertension Mother    Stroke Mother    Colon cancer Neg Hx    Anesthesia problems Neg Hx    Hypotension Neg Hx    Malignant hyperthermia Neg Hx    Pseudochol deficiency Neg Hx    Gastric cancer Neg Hx    Esophageal cancer Neg Hx     CURRENT MEDICATIONS:  Current Outpatient Medications  Medication Sig Dispense Refill   acyclovir (ZOVIRAX) 400 MG tablet Take 1 tablet (400 mg total) by mouth 2 (two) times daily. 60 tablet 3   allopurinol (ZYLOPRIM) 300 MG tablet Take 1 tablet (300 mg total) by mouth daily. 30 tablet 3   ALPRAZolam (XANAX) 0.5 MG tablet TAKE 1 TABLET BY MOUTH AT BEDTIME 30 tablet 0   EPINEPHRINE 0.3 mg/0.3 mL IJ SOAJ injection INJECT 0.3 MLS INTO MUSCLE ONCE AS NEEDED 1 Device 2   gabapentin (NEURONTIN) 300 MG capsule TAKE 1 CAPSULE BY MOUTH ONCE DAILY FOR   CHRONIC  PAIN (Patient taking differently: TAKE 1 CAPSULE BY MOUTH ONCE DAILY FOR  CHRONIC  PAIN) 90 capsule 0   hydrochlorothiazide (HYDRODIURIL) 25 MG tablet Take 1 tablet by mouth once daily 90 tablet 0   losartan (COZAAR) 25 MG tablet Take 1 tablet by mouth once daily for blood pressure 30 tablet 0   meloxicam (MOBIC) 7.5 MG tablet Take 1 tablet (7.5 mg total) by mouth daily as needed for pain (knee pain). 30 tablet 5   Oxycodone HCl 10 MG TABS Take 1 tablet (10 mg total) by mouth 4 (four) times daily. 56 tablet 0   pantoprazole (PROTONIX) 40 MG tablet TAKE 1 TABLET BY MOUTH ONCE DAILY BEFORE BREAKFAST 90 tablet 3   potassium chloride (KLOR-CON) 10 MEQ tablet      prednisoLONE acetate (PRED FORTE) 1 % ophthalmic suspension Place into the right eye.     Probiotic Product (EQL DAILY PROBIOTIC PO) Take 1 capsule by mouth daily.     ruxolitinib phosphate (JAKAFI) 10 MG tablet Take 1 tablet (10 mg total) by mouth 2 (two) times daily. 60 tablet 0   temazepam (RESTORIL) 30 MG capsule Take 1 capsule by mouth at bedtime 30 capsule 0   tiZANidine (ZANAFLEX) 4 MG tablet Take 8 mg by mouth 3 (three) times daily as needed (spasms). 180 tablet 0   valsartan (DIOVAN) 80 MG tablet Take by mouth.     lidocaine-prilocaine (EMLA) cream Apply 1 application topically as needed. (Patient not taking: Reported on 02/25/2021) 30 g 3   No current facility-administered medications for this visit.    ALLERGIES:  Allergies  Allergen Reactions   Bee Venom Swelling and Hives   Acetaminophen     itches   Amlodipine     Hair loss   Tyloxapol    Other Itching, Rash and Swelling   Oxycodone-Acetaminophen Rash    Pt states, "this gives her a rash, but at home she takes oxycodone for pain relief" Pt states, "this gives her a rash, but at home she takes oxycodone for  pain relief"    PHYSICAL EXAM:  Performance status (ECOG): 1 - Symptomatic but completely ambulatory  There were no vitals filed for this visit. Wt  Readings from Last 3 Encounters:  02/25/21 119 lb 6.4 oz (54.2 kg)  02/09/21 114 lb (51.7 kg)  02/03/21 115 lb 8.3 oz (52.4 kg)   Physical Exam Vitals reviewed.  Constitutional:      Appearance: Normal appearance.  Cardiovascular:     Rate and Rhythm: Normal rate and regular rhythm.     Pulses: Normal pulses.     Heart sounds: Normal heart sounds.  Pulmonary:     Effort: Pulmonary effort is normal.     Breath sounds: Normal breath sounds.  Abdominal:     Palpations: Abdomen is soft. There is hepatomegaly (palpable 2 finger breadths below costal margin). There is no splenomegaly or mass.     Tenderness: There is abdominal tenderness in the right upper quadrant.  Musculoskeletal:     Right lower leg: No edema.     Left lower leg: No edema.  Neurological:     General: No focal deficit present.     Mental Status: She is alert and oriented to person, place, and time.  Psychiatric:        Mood and Affect: Mood normal.        Behavior: Behavior normal.    LABORATORY DATA:  I have reviewed the labs as listed.  CBC Latest Ref Rng & Units 02/25/2021 02/02/2021 01/21/2021  WBC 4.0 - 10.5 K/uL 139.6(HH) 166.3(HH) 101.8(HH)  Hemoglobin 12.0 - 15.0 g/dL 6.4(LL) 7.1(L) 8.6(L)  Hematocrit 36.0 - 46.0 % 19.5(L) 22.1(L) 26.9(L)  Platelets 150 - 400 K/uL 187 193 341   CMP Latest Ref Rng & Units 02/25/2021 01/21/2021 11/06/2020  Glucose 70 - 99 mg/dL 114(H) 114(H) 79  BUN 8 - 23 mg/dL 25(H) 25(H) 17  Creatinine 0.44 - 1.00 mg/dL 1.32(H) 1.55(H) 1.37(H)  Sodium 135 - 145 mmol/L 142 140 140  Potassium 3.5 - 5.1 mmol/L 3.5 3.5 4.2  Chloride 98 - 111 mmol/L 110 105 107  CO2 22 - 32 mmol/L 24 24 25   Calcium 8.9 - 10.3 mg/dL 8.3(L) 8.7(L) 8.7(L)  Total Protein 6.5 - 8.1 g/dL 6.7 8.6(H) 8.6(H)  Total Bilirubin 0.3 - 1.2 mg/dL 0.5 0.6 0.7  Alkaline Phos 38 - 126 U/L 179(H) 173(H) 186(H)  AST 15 - 41 U/L 37 35 33  ALT 0 - 44 U/L 15 15 15       Component Value Date/Time   RBC 1.96 (L) 02/25/2021  0927   MCV 99.5 02/25/2021 0927   MCV 91 05/27/2020 1632   MCH 32.7 02/25/2021 0927   MCHC 32.8 02/25/2021 0927   RDW 23.9 (H) 02/25/2021 0927   RDW 14.1 05/27/2020 1632   LYMPHSABS 2.8 02/25/2021 0927   LYMPHSABS 1.7 05/27/2020 1632   MONOABS 12.6 (H) 02/25/2021 0927   EOSABS 4.2 (H) 02/25/2021 0927   EOSABS 0.2 05/27/2020 1632   BASOSABS 1.4 (H) 02/25/2021 0927   BASOSABS 0.0 05/27/2020 1632    DIAGNOSTIC IMAGING:  I have independently reviewed the scans and discussed with the patient. IR IMAGING GUIDED PORT INSERTION  Result Date: 02/09/2021 INDICATION: 76 year old with poor venous access and MPL positive myeloproliferative neoplasm. EXAM: FLUOROSCOPIC AND ULTRASOUND GUIDED PLACEMENT OF A SUBCUTANEOUS PORT COMPARISON:  None. MEDICATIONS: Moderate sedation ANESTHESIA/SEDATION: Versed 4.0 mg IV; Fentanyl 200 mcg IV; Moderate Sedation Time:  30 minutes The patient was continuously monitored during the procedure by the interventional radiology  nurse under my direct supervision. FLUOROSCOPY TIME:  30 seconds, 1 mGy COMPLICATIONS: None immediate. PROCEDURE: The procedure, risks, benefits, and alternatives were explained to the patient. Questions regarding the procedure were encouraged and answered. The patient understands and consents to the procedure. Patient was placed supine on the interventional table. Ultrasound confirmed a patent right internal jugular vein. Ultrasound image was saved for documentation. The right chest and neck were cleaned with a skin antiseptic and a sterile drape was placed. Maximal barrier sterile technique was utilized including caps, mask, sterile gowns, sterile gloves, sterile drape, hand hygiene and skin antiseptic. The right neck was anesthetized with 1% lidocaine. Small incision was made in the right neck with a blade. Micropuncture set was placed in the right internal jugular vein with ultrasound guidance. The micropuncture wire was used for measurement purposes. The  right chest was anesthetized with 1% lidocaine with epinephrine. #15 blade was used to make an incision and a subcutaneous port pocket was formed. [place] was assembled. Subcutaneous tunnel was formed with a stiff tunneling device. The port catheter was brought through the subcutaneous tunnel. The port was placed in the subcutaneous pocket. The micropuncture set was exchanged for a peel-away sheath. The catheter was placed through the peel-away sheath and the tip was positioned at the superior cavoatrial junction. Catheter placement was confirmed with fluoroscopy. The port was accessed and flushed with heparinized saline. The port pocket was closed using two layers of absorbable sutures and Dermabond. The vein skin site was closed using a single layer of absorbable suture and Dermabond. Sterile dressings were applied. Patient tolerated the procedure well without an immediate complication. Ultrasound and fluoroscopic images were taken and saved for this procedure. IMPRESSION: Placement of a subcutaneous power-injectable port device. Catheter tip at the superior cavoatrial junction. Electronically Signed   By: Markus Daft M.D.   On: 02/09/2021 16:55     ASSESSMENT:  Leukocytosis with left shift: - CBC on 11/06/2020 with white count 75.6, differential 59% neutrophils, 12% monocytes, 4% lymphocytes, 1 percentage of eosinophils and basophils, 6% band neutrophils, 12% myelocytes, 1% promyelocytes, 29% blasts. - Pathologist review of blood smear reported as leukoerythroblastic reaction. - 25 pound weight loss in the last 6 months, unintentional.  Decreased appetite.  Reports fatigue for the last few months.  Reports night sweats x3 in the last 1 month. - Bone marrow biopsy on 11/18/2020 consistent with granulocytic proliferation with differential including CMML versus myeloproliferative disorder. - BCR/ABL was negative. 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 - NGS myeloid panel  shows mutations in ASXL1, MPL, TET2, EZH2 mutations. - Spleen ultrasound on 12/16/2020 shows mildly enlarged measuring 12.4 x 9.6 x 7 cm with volume of 435 cc. - PDGFR alpha, beta and FGFR 1 was negative. - She was evaluated by Dr. Florene Glen at [facility]  Slides were reviewed at [facility] hematopathology.  They thought it was less likely CMML and more likely MPL positive myeloproliferative neoplasm.  Dr. Florene Glen has recommended initiate Jakafi. - Ruxolitinib 10 mg twice daily started on 02/16/2021.  2.  Social/family history: - She lives at home and is able to do all her ADLs and IADLs although she is getting tired lately.  She reports quitting smoking 6 months ago and smoked 1 pack/week for 43 years. - She believes that her mother had some kind of leukemia.  No other malignancies.   PLAN:  MPL positive myeloproliferative neoplasm: - Ruxolitinib was started on 02/16/2021. - She  reported more night sweats, every night since the start of ruxolitinib.  She also had slight headaches.  She reported improvement in her appetite at times.  No itching of the skin noted. - Physical examination shows right upper quadrant tenderness with liver edge palpable 2 cm below the costal margin.  No splenomegaly. - Reviewed labs today which showed white count improved to 139 from 166.  Hemoglobin is 6.4, down from 7.1.  Platelet count is normal.  LFTs were normal.  Mildly elevated creatinine is also stable. - Recommend 1 unit of PRBC transfusion. - Continue ruxolitinib at the current dose.  RTC 2 weeks for follow-up with repeat labs.  2.  Decreased appetite: - She has gained weight of 4 pounds since the start of Jakafi.  3.  CKD: - Baseline creatinine between 1.3-1.5.  Continue follow-up with Dr. Theador Hawthorne.  4.  Hyperuricemia: - Continue allopurinol 300 mg daily.  Orders placed this encounter:  No orders of the defined types were placed in this encounter.    Derek Jack, MD [place] [phone]   I, Thana Ates, am acting as a scribe for Dr. Derek Jack.  I, Derek Jack MD, have reviewed the above documentation for accuracy and completeness, and I agree with the above.

## 2021-02-25 NOTE — Progress Notes (Signed)
CRITICAL VALUE ALERT Critical value received:  WBC 139.6, HGB 6.4. Date of notification:  02/25/2021 Time of notification: 10:17 am  Critical value read back:  Yes.   Nurse who received alert:  B. Emeline Simpson RN MD notified time and response:  [name] / A . Mickey Farber RN. Patient seen by MD in clinic today.

## 2021-02-25 NOTE — Patient Instructions (Signed)
[place] at [facility] Discharge Instructions   You were seen and examined today by [name].  He reviewed your lab work which is normal/stable, except for your hemoglobin.  It was 6.4 today.  We will arrange for you to have a blood transfusion tomorrow.   Return as scheduled in 2 weeks.    Thank you for choosing [place] at [facility] to provide your oncology and hematology care.  To afford each patient quality time with our provider, please arrive at least 15 minutes before your scheduled appointment time.   If you have a lab appointment with the [facility] please come in thru the Main Entrance and check in at the main information desk.  You need to re-schedule your appointment should you arrive 10 or more minutes late.  We strive to give you quality time with our providers, and arriving late affects you and other patients whose appointments are after yours.  Also, if you no show three or more times for appointments you may be dismissed from the clinic at the providers discretion.     Again, thank you for choosing [facility].  Our hope is that these requests will decrease the amount of time that you wait before being seen by our physicians.       _____________________________________________________________  Should you have questions after your visit to [facility], please contact our office at [phone] and follow the prompts.  Our office hours are 8:00 a.m. and 4:30 p.m. Monday - Friday.  Please note that voicemails left after 4:00 p.m. may not be returned until the following business day.  We are closed weekends and major holidays.  You do have access to a nurse 24-7, just call the main number to the clinic [phone] and do not press any options, hold on the line and a nurse will answer the phone.    For prescription refill requests, have your pharmacy contact our office and allow 72 hours.     Due to Covid, you will need to wear a mask upon entering the hospital. If you do not have a mask, a mask will be given to you at the Main Entrance upon arrival. For doctor visits, patients may have 1 support person age 19 or older with them. For treatment visits, patients can not have anyone with them due to social distancing guidelines and our immunocompromised population.

## 2021-02-26 ENCOUNTER — Other Ambulatory Visit: Payer: Self-pay | Admitting: Family Medicine

## 2021-02-26 ENCOUNTER — Inpatient Hospital Stay (HOSPITAL_COMMUNITY): Payer: Medicare Other

## 2021-02-26 ENCOUNTER — Encounter (HOSPITAL_COMMUNITY): Payer: Self-pay

## 2021-02-26 DIAGNOSIS — Z87891 Personal history of nicotine dependence: Secondary | ICD-10-CM | POA: Diagnosis not present

## 2021-02-26 DIAGNOSIS — C946 Myelodysplastic disease, not classified: Secondary | ICD-10-CM | POA: Diagnosis not present

## 2021-02-26 DIAGNOSIS — E790 Hyperuricemia without signs of inflammatory arthritis and tophaceous disease: Secondary | ICD-10-CM | POA: Diagnosis not present

## 2021-02-26 DIAGNOSIS — Z79899 Other long term (current) drug therapy: Secondary | ICD-10-CM | POA: Diagnosis not present

## 2021-02-26 DIAGNOSIS — R61 Generalized hyperhidrosis: Secondary | ICD-10-CM | POA: Diagnosis not present

## 2021-02-26 DIAGNOSIS — N189 Chronic kidney disease, unspecified: Secondary | ICD-10-CM | POA: Diagnosis not present

## 2021-02-26 DIAGNOSIS — D72829 Elevated white blood cell count, unspecified: Secondary | ICD-10-CM

## 2021-02-26 MED ORDER — DIPHENHYDRAMINE HCL 25 MG PO CAPS
25.0000 mg | ORAL_CAPSULE | Freq: Once | ORAL | Status: AC
  Administered 2021-02-26: 25 mg via ORAL
  Filled 2021-02-26: qty 1

## 2021-02-26 MED ORDER — ACETAMINOPHEN 325 MG PO TABS
650.0000 mg | ORAL_TABLET | Freq: Once | ORAL | Status: AC
  Administered 2021-02-26: 650 mg via ORAL
  Filled 2021-02-26: qty 2

## 2021-02-26 MED ORDER — SODIUM CHLORIDE 0.9% FLUSH
10.0000 mL | Freq: Once | INTRAVENOUS | Status: AC
Start: 2021-02-26 — End: 2021-02-26
  Administered 2021-02-26: 10 mL via INTRAVENOUS

## 2021-02-26 MED ORDER — SODIUM CHLORIDE 0.9% IV SOLUTION
250.0000 mL | Freq: Once | INTRAVENOUS | Status: AC
  Administered 2021-02-26: 250 mL via INTRAVENOUS

## 2021-02-26 MED ORDER — HEPARIN SOD (PORK) LOCK FLUSH 100 UNIT/ML IV SOLN
500.0000 [IU] | Freq: Once | INTRAVENOUS | Status: AC
Start: 2021-02-26 — End: 2021-02-26
  Administered 2021-02-26: 500 [IU] via INTRAVENOUS

## 2021-02-26 NOTE — Patient Instructions (Signed)
[facility]  Discharge Instructions: Thank you for choosing [facility] to provide your oncology and hematology care.  If you have a lab appointment with the Cancer Center, please come in thru the Main Entrance and check in at the main information desk.  Wear comfortable clothing and clothing appropriate for easy access to any Portacath or PICC line.   We strive to give you quality time with your provider. You may need to reschedule your appointment if you arrive late (15 or more minutes).  Arriving late affects you and other patients whose appointments are after yours.  Also, if you miss three or more appointments without notifying the office, you may be dismissed from the clinic at the provider's discretion.      For prescription refill requests, have your pharmacy contact our office and allow 72 hours for refills to be completed.    Today you received the following: 2 Units of Blood .       To help prevent nausea and vomiting after your treatment, we encourage you to take your nausea medication as directed.  BELOW ARE SYMPTOMS THAT SHOULD BE REPORTED IMMEDIATELY: *FEVER GREATER THAN 100.4 F (38 C) OR HIGHER *CHILLS OR SWEATING *NAUSEA AND VOMITING THAT IS NOT CONTROLLED WITH YOUR NAUSEA MEDICATION *UNUSUAL SHORTNESS OF BREATH *UNUSUAL BRUISING OR BLEEDING *URINARY PROBLEMS (pain or burning when urinating, or frequent urination) *BOWEL PROBLEMS (unusual diarrhea, constipation, pain near the anus) TENDERNESS IN MOUTH AND THROAT WITH OR WITHOUT PRESENCE OF ULCERS (sore throat, sores in mouth, or a toothache) UNUSUAL RASH, SWELLING OR PAIN  UNUSUAL VAGINAL DISCHARGE OR ITCHING   Items with * indicate a potential emergency and should be followed up as soon as possible or go to the Emergency Department if any problems should occur.  Please show the CHEMOTHERAPY ALERT CARD or IMMUNOTHERAPY ALERT CARD at check-in to the Emergency Department and triage  nurse.  Should you have questions after your visit or need to cancel or reschedule your appointment, please contact [facility] [phone]  and follow the prompts.  Office hours are 8:00 a.m. to 4:30 p.m. Monday - Friday. Please note that voicemails left after 4:00 p.m. may not be returned until the following business day.  We are closed weekends and major holidays. You have access to a nurse at all times for urgent questions. Please call the main number to the clinic [phone] and follow the prompts.  For any non-urgent questions, you may also contact your provider using MyChart. We now offer e-Visits for anyone 18 and older to request care online for non-urgent symptoms. For details visit [email or web address].   Also download the MyChart app! Go to the app store, search "MyChart", open the app, select [place], and log in with your MyChart username and password.  Due to Covid, a mask is required upon entering the hospital/clinic. If you do not have a mask, one will be given to you upon arrival. For doctor visits, patients may have 1 support person aged 18 or older with them. For treatment visits, patients cannot have anyone with them due to current Covid guidelines and our immunocompromised population.  

## 2021-02-26 NOTE — Progress Notes (Signed)
Patient presents today for 2 units of PRBC's. Vitals signs stable. Patient has no complaints of any changes since her  last visit. MAR reviewed and updated.    10:50 am Blood pressure 87/55. Reported blood pressure to [name] and R. Pennington PA by secure chat. Patient alert and oriented. Patient states , " I took my blood pressure medication prior to coming here and I took 10 mg of Oxycodone for back pain." Patient given pre-medications for blood products and received 25 mg of Benadryl PO at 0951.   12:30 pm blood pressure 94/62. Reported blood pressure to [name] at the bedside. MD at bedside to check on patient. No new orders received. Patient sitting up eating crackers and talking with RN .   2 units of blood given today per MD orders. Tolerated infusion without adverse affects. Vital signs stable. No complaints at this time. Discharged from clinic by wheel chair in stable condition. Alert and oriented x 3. F/U with [facility] as scheduled.

## 2021-02-27 LAB — TYPE AND SCREEN
ABO/RH(D): O POS
Antibody Screen: POSITIVE
Unit division: 0
Unit division: 0

## 2021-02-27 LAB — BPAM RBC
Blood Product Expiration Date: 202302082359
Blood Product Expiration Date: 202302132359
ISSUE DATE / TIME: 202301201025
ISSUE DATE / TIME: 202301201223
Unit Type and Rh: 5100
Unit Type and Rh: 5100

## 2021-03-01 ENCOUNTER — Telehealth: Payer: Self-pay | Admitting: *Deleted

## 2021-03-01 NOTE — Chronic Care Management (AMB) (Signed)
°  Care Management   Note  03/01/2021 Name: Renee Waters MRN: [id number] DOB: [birth year]  Renee Waters is a 76 y.o. year old female who is a primary care patient of [name], MD and is actively engaged with the care management team. I reached out to [name] by phone today to assist with re-scheduling a follow up visit with the Licensed Clinical Social Worker  Follow up plan: Unsuccessful telephone outreach attempt made. A HIPAA compliant phone message was left for the patient providing contact information and requesting a return call.  The care management team will reach out to the patient again over the next 7 days.  If patient returns call to provider office, please advise to call [place] at [phone].  [facility] Management  Direct Dial: [phone]

## 2021-03-01 NOTE — Chronic Care Management (AMB) (Signed)
°  Care Management   Note  03/01/2021 Name: Renee Waters MRN: [id number] DOB: November 06, 1945  Renee Waters is a 76 y.o. year old female who is a primary care patient of [name], MD and is actively engaged with the care management team. I reached out to [name] by phone today to assist with re-scheduling a follow up visit with the Licensed Clinical Social Worker  Follow up plan: Telephone appointment with care management team member scheduled for:03/19/21  [facility]  Direct Dial: [phone]

## 2021-03-03 ENCOUNTER — Other Ambulatory Visit: Payer: Self-pay | Admitting: Internal Medicine

## 2021-03-03 ENCOUNTER — Other Ambulatory Visit: Payer: Self-pay | Admitting: Family Medicine

## 2021-03-08 ENCOUNTER — Other Ambulatory Visit: Payer: Self-pay

## 2021-03-08 ENCOUNTER — Telehealth: Payer: Self-pay | Admitting: Family Medicine

## 2021-03-08 MED ORDER — TEMAZEPAM 30 MG PO CAPS: 30.0000 mg | ORAL_CAPSULE | Freq: Every day | ORAL | 0 refills | Status: DC

## 2021-03-08 MED ORDER — ALPRAZOLAM 0.5 MG PO TABS: 0.5000 mg | ORAL_TABLET | Freq: Every day | ORAL | 0 refills | Status: DC

## 2021-03-08 NOTE — Telephone Encounter (Signed)
Rx printed waiting on signature to fax

## 2021-03-08 NOTE — Telephone Encounter (Signed)
Pt called in for refills on    temazepam (RESTORIL) 30 MG capsule   ALPRAZolam (XANAX) 0.5 MG tablet

## 2021-03-08 NOTE — Telephone Encounter (Signed)
Patient calling back she has been up for 2 nights and needs medicine asap.

## 2021-03-09 ENCOUNTER — Other Ambulatory Visit: Payer: Self-pay | Admitting: Family Medicine

## 2021-03-09 MED ORDER — ALPRAZOLAM 0.5 MG PO TABS: 0.5000 mg | ORAL_TABLET | Freq: Every day | ORAL | 5 refills | Status: DC

## 2021-03-09 MED ORDER — TEMAZEPAM 30 MG PO CAPS: 30.0000 mg | ORAL_CAPSULE | Freq: Every evening | ORAL | 5 refills | Status: DC | PRN

## 2021-03-09 NOTE — Telephone Encounter (Signed)
Can this be sent electronically since she has been waiting? I don't see the printed rx

## 2021-03-09 NOTE — Telephone Encounter (Signed)
Grant funding opened up while applying for PAP.  Secured patient a Radio producer to cover the cost of [place].

## 2021-03-10 ENCOUNTER — Other Ambulatory Visit (HOSPITAL_COMMUNITY): Payer: Self-pay | Admitting: Hematology

## 2021-03-10 ENCOUNTER — Other Ambulatory Visit (HOSPITAL_COMMUNITY): Payer: Self-pay

## 2021-03-11 ENCOUNTER — Inpatient Hospital Stay (HOSPITAL_COMMUNITY): Payer: Medicare Other | Attending: Hematology | Admitting: Hematology

## 2021-03-11 ENCOUNTER — Inpatient Hospital Stay (HOSPITAL_COMMUNITY): Payer: Medicare Other

## 2021-03-11 ENCOUNTER — Other Ambulatory Visit: Payer: Self-pay

## 2021-03-11 VITALS — BP 109/69 | HR 83 | Temp 98.2°F | Resp 18 | Ht 65.0 in | Wt 117.4 lb

## 2021-03-11 DIAGNOSIS — R197 Diarrhea, unspecified: Secondary | ICD-10-CM | POA: Diagnosis not present

## 2021-03-11 DIAGNOSIS — E876 Hypokalemia: Secondary | ICD-10-CM | POA: Diagnosis not present

## 2021-03-11 DIAGNOSIS — M329 Systemic lupus erythematosus, unspecified: Secondary | ICD-10-CM | POA: Diagnosis not present

## 2021-03-11 DIAGNOSIS — R160 Hepatomegaly, not elsewhere classified: Secondary | ICD-10-CM | POA: Diagnosis not present

## 2021-03-11 DIAGNOSIS — R1012 Left upper quadrant pain: Secondary | ICD-10-CM | POA: Insufficient documentation

## 2021-03-11 DIAGNOSIS — R61 Generalized hyperhidrosis: Secondary | ICD-10-CM | POA: Insufficient documentation

## 2021-03-11 DIAGNOSIS — R1084 Generalized abdominal pain: Secondary | ICD-10-CM | POA: Diagnosis not present

## 2021-03-11 DIAGNOSIS — Z79899 Other long term (current) drug therapy: Secondary | ICD-10-CM | POA: Diagnosis not present

## 2021-03-11 DIAGNOSIS — R519 Headache, unspecified: Secondary | ICD-10-CM | POA: Insufficient documentation

## 2021-03-11 DIAGNOSIS — M1990 Unspecified osteoarthritis, unspecified site: Secondary | ICD-10-CM | POA: Insufficient documentation

## 2021-03-11 DIAGNOSIS — N183 Chronic kidney disease, stage 3 unspecified: Secondary | ICD-10-CM | POA: Diagnosis not present

## 2021-03-11 DIAGNOSIS — C946 Myelodysplastic disease, not classified: Secondary | ICD-10-CM | POA: Insufficient documentation

## 2021-03-11 DIAGNOSIS — R5383 Other fatigue: Secondary | ICD-10-CM | POA: Insufficient documentation

## 2021-03-11 DIAGNOSIS — G709 Myoneural disorder, unspecified: Secondary | ICD-10-CM | POA: Insufficient documentation

## 2021-03-11 DIAGNOSIS — R0602 Shortness of breath: Secondary | ICD-10-CM | POA: Insufficient documentation

## 2021-03-11 DIAGNOSIS — K219 Gastro-esophageal reflux disease without esophagitis: Secondary | ICD-10-CM | POA: Insufficient documentation

## 2021-03-11 DIAGNOSIS — K5900 Constipation, unspecified: Secondary | ICD-10-CM | POA: Insufficient documentation

## 2021-03-11 DIAGNOSIS — M2550 Pain in unspecified joint: Secondary | ICD-10-CM | POA: Diagnosis not present

## 2021-03-11 DIAGNOSIS — I129 Hypertensive chronic kidney disease with stage 1 through stage 4 chronic kidney disease, or unspecified chronic kidney disease: Secondary | ICD-10-CM | POA: Insufficient documentation

## 2021-03-11 DIAGNOSIS — Z791 Long term (current) use of non-steroidal anti-inflammatories (NSAID): Secondary | ICD-10-CM | POA: Insufficient documentation

## 2021-03-11 DIAGNOSIS — R630 Anorexia: Secondary | ICD-10-CM | POA: Diagnosis not present

## 2021-03-11 DIAGNOSIS — R059 Cough, unspecified: Secondary | ICD-10-CM | POA: Diagnosis not present

## 2021-03-11 DIAGNOSIS — D72829 Elevated white blood cell count, unspecified: Secondary | ICD-10-CM | POA: Diagnosis not present

## 2021-03-11 DIAGNOSIS — E790 Hyperuricemia without signs of inflammatory arthritis and tophaceous disease: Secondary | ICD-10-CM | POA: Diagnosis not present

## 2021-03-11 LAB — COMPREHENSIVE METABOLIC PANEL
ALT: 18 U/L (ref 0–44)
AST: 44 U/L — ABNORMAL HIGH (ref 15–41)
Albumin: 3.8 g/dL (ref 3.5–5.0)
Alkaline Phosphatase: 159 U/L — ABNORMAL HIGH (ref 38–126)
Anion gap: 7 (ref 5–15)
BUN: 30 mg/dL — ABNORMAL HIGH (ref 8–23)
CO2: 21 mmol/L — ABNORMAL LOW (ref 22–32)
Calcium: 8.1 mg/dL — ABNORMAL LOW (ref 8.9–10.3)
Chloride: 108 mmol/L (ref 98–111)
Creatinine, Ser: 1.38 mg/dL — ABNORMAL HIGH (ref 0.44–1.00)
GFR, Estimated: 40 mL/min — ABNORMAL LOW (ref >60)
Glucose, Bld: 143 mg/dL — ABNORMAL HIGH (ref 70–99)
Potassium: 3.1 mmol/L — ABNORMAL LOW (ref 3.5–5.1)
Sodium: 136 mmol/L (ref 135–145)
Total Bilirubin: 0.6 mg/dL (ref 0.3–1.2)
Total Protein: 7.1 g/dL (ref 6.5–8.1)

## 2021-03-11 LAB — URIC ACID: Uric Acid, Serum: 3.4 mg/dL (ref 2.5–7.1)

## 2021-03-11 LAB — CBC WITH DIFFERENTIAL/PLATELET
Band Neutrophils: 22 %
Basophils Absolute: 0 10*3/uL (ref 0.0–0.1)
Basophils Relative: 0 %
Blasts: 5 %
Eosinophils Absolute: 0 10*3/uL (ref 0.0–0.5)
Eosinophils Relative: 0 %
HCT: 27.5 % — ABNORMAL LOW (ref 36.0–46.0)
Hemoglobin: 9 g/dL — ABNORMAL LOW (ref 12.0–15.0)
Lymphocytes Relative: 10 %
Lymphs Abs: 12.4 10*3/uL — ABNORMAL HIGH (ref 0.7–4.0)
MCH: 30.4 pg (ref 26.0–34.0)
MCHC: 32.7 g/dL (ref 30.0–36.0)
MCV: 92.9 fL (ref 80.0–100.0)
Metamyelocytes Relative: 11 %
Monocytes Absolute: 14.9 10*3/uL — ABNORMAL HIGH (ref 0.1–1.0)
Monocytes Relative: 12 %
Neutro Abs: 75.8 10*3/uL — ABNORMAL HIGH (ref 1.7–7.7)
Neutrophils Relative %: 39 %
Platelets: 123 10*3/uL — ABNORMAL LOW (ref 150–400)
Promyelocytes Relative: 1 %
RBC: 2.96 MIL/uL — ABNORMAL LOW (ref 3.87–5.11)
RDW: 21.6 % — ABNORMAL HIGH (ref 11.5–15.5)
WBC: 124.3 10*3/uL (ref 4.0–10.5)
nRBC: 1 /100 WBC — ABNORMAL HIGH
nRBC: 1.6 % — ABNORMAL HIGH (ref 0.0–0.2)

## 2021-03-11 LAB — LACTATE DEHYDROGENASE: LDH: 959 U/L — ABNORMAL HIGH (ref 98–192)

## 2021-03-11 LAB — SAMPLE TO BLOOD BANK

## 2021-03-11 MED ORDER — RUXOLITINIB PHOSPHATE 10 MG PO TABS
10.0000 mg | ORAL_TABLET | Freq: Two times a day (BID) | ORAL | 0 refills | Status: DC
  Filled 2021-03-11: qty 60, 30d supply, fill #0

## 2021-03-11 MED ORDER — SODIUM CHLORIDE 0.9% FLUSH
10.0000 mL | Freq: Once | INTRAVENOUS | Status: AC
  Administered 2021-03-11: 10 mL via INTRAVENOUS

## 2021-03-11 MED ORDER — POTASSIUM CHLORIDE CRYS ER 10 MEQ PO TBCR: 10.0000 meq | EXTENDED_RELEASE_TABLET | Freq: Every day | ORAL | 3 refills | Status: DC

## 2021-03-11 MED ORDER — HEPARIN SOD (PORK) LOCK FLUSH 100 UNIT/ML IV SOLN
500.0000 [IU] | Freq: Once | INTRAVENOUS | Status: AC
  Administered 2021-03-11: 500 [IU] via INTRAVENOUS

## 2021-03-11 NOTE — Progress Notes (Signed)
CRITICAL VALUE ALERT Critical value received:  WBC 124.32 Date of notification:  03/11/2021 Time of notification: 12:17 pm  Critical value read back:  Yes.   Nurse who received alert:  B. Peighton Mehra RN  MD notified time and response:  [name] / Lora Havens RN.

## 2021-03-11 NOTE — Patient Instructions (Signed)
[place] at [facility] Discharge Instructions   You were seen and examined today by [name].  He reviewed your labs which are normal/stable.   Start potassium pills - 1 pill per day.  Your potassium was slightly low today.  Return as scheduled in 3 weeks for lab recheck and office visit.    Thank you for choosing [place] at [facility] to provide your oncology and hematology care.  To afford each patient quality time with our provider, please arrive at least 15 minutes before your scheduled appointment time.   If you have a lab appointment with the [facility] please come in thru the Main Entrance and check in at the main information desk.  You need to re-schedule your appointment should you arrive 10 or more minutes late.  We strive to give you quality time with our providers, and arriving late affects you and other patients whose appointments are after yours.  Also, if you no show three or more times for appointments you may be dismissed from the clinic at the providers discretion.     Again, thank you for choosing [facility].  Our hope is that these requests will decrease the amount of time that you wait before being seen by our physicians.       _____________________________________________________________  Should you have questions after your visit to [facility], please contact our office at [phone] and follow the prompts.  Our office hours are 8:00 a.m. and 4:30 p.m. Monday - Friday.  Please note that voicemails left after 4:00 p.m. may not be returned until the following business day.  We are closed weekends and major holidays.  You do have access to a nurse 24-7, just call the main number to the clinic [phone] and do not press any options, hold on the line and a nurse will answer the phone.    For prescription refill requests, have your pharmacy contact our office and allow 72 hours.     Due to Covid, you will need to wear a mask upon entering the hospital. If you do not have a mask, a mask will be given to you at the Main Entrance upon arrival. For doctor visits, patients may have 1 support person age 39 or older with them. For treatment visits, patients can not have anyone with them due to social distancing guidelines and our immunocompromised population.

## 2021-03-11 NOTE — Progress Notes (Signed)
[place], [place] [address]   CLINIC:  Medical Oncology/Hematology  PCP:  [name], MD [address] [place] / [place] [address] [phone]   REASON FOR VISIT:  Follow-up for MPL positive myeloproliferative neoplasm  PRIOR THERAPY: none  CURRENT THERAPY: Jakafi 10 mg twice daily  BRIEF ONCOLOGIC HISTORY:  Oncology History   No history exists.    CANCER STAGING:  Cancer Staging  No matching staging information was found for the patient.  INTERVAL HISTORY:  [name], a 76 y.o. female, returns for routine follow-up of her MPL positive myeloproliferative neoplasm. Renee Waters was last seen on 02/25/2021.   Today she reports feeling good. She reports drenching night sweats once every night. She has gained 2 lbs since 02/03/21. Her headaches have resolved, and she denies itching. Her leg swelling has improved.  REVIEW OF SYSTEMS:  Review of Systems  Constitutional:  Negative for appetite change, fatigue and unexpected weight change (+2 lbs).  Respiratory:  Positive for cough.   Cardiovascular:  Positive for leg swelling (improved).  Musculoskeletal:  Positive for arthralgias (6/10) and back pain (6/10).  Skin:  Negative for itching.  Neurological:  Positive for numbness. Negative for headaches (resolved).  All other systems reviewed and are negative.  PAST MEDICAL/SURGICAL HISTORY:  Past Medical History:  Diagnosis Date   Acute cholangitis    Allergy    Anemia    Anxiety    Arthritis    Phreesia 03/18/2020   Barrett's esophagus    Cataract    Chronic back pain    Chronic neck pain    CKD (chronic kidney disease) stage 3, GFR 30-59 ml/min ([place]) 10/29/2020   Depression    Genital herpes    GERD (gastroesophageal reflux disease)    H/O degenerative disc disease    History of blood transfusion    Hypertension    Insomnia    Lupus (systemic lupus erythematosus) ([place])    Neuromuscular disorder ([place])     Osteoarthritis    S/P colonoscopy June 2005   normal, no polyps   S/P endoscopy June 2005, Oct 2009   2005: short-segment Barrett's, 2009: short-segment Barrett's   UTI (lower urinary tract infection) 11/2012   Past Surgical History:  Procedure Laterality Date   ABDOMINAL HYSTERECTOMY     BACK SURGERY     BIOPSY N/A 03/20/2014   Procedure: BIOPSY;  Surgeon: Daneil Dolin, MD;  Location: [facility];  Service: Endoscopy;  Laterality: N/A;   BIOPSY  09/14/2015   Procedure: BIOPSY;  Surgeon: Daneil Dolin, MD;  Location: [facility] END[facility] SU[facility];  Service: Endoscopy;;  esophageal and gastric   BIOPSY  07/23/2019   Procedure: BIOPSY;  Surgeon: Daneil Dolin, MD;  Location: [facility];  Service: Endoscopy;;  esophageal    CARPAL TUNNEL RELEASE Left 2013   cervical disectomy  2002   CESAREAN SECTION N/A    Phreesia 03/18/2020   CHOLECYSTECTOMY     with lysis of adhesions for sbo; "ruptured gallbladder".   COLONOSCOPY  11/09/2011   RMR: Melanosis coli   COLONOSCOPY WITH PROPOFOL N/A 09/14/2015   Dr. Gala Romney: diverticulosis, 64m TA removed. next TCS 09/2020.    COLONOSCOPY WITH PROPOFOL N/A 04/30/2020   Procedure: COLONOSCOPY WITH PROPOFOL;  Surgeon: RDaneil Dolin MD;  Location: [facility];  Service: Endoscopy;  Laterality: N/A;  PM (ASA 3)   DENTAL SURGERY  11/2015   multiple tooth extraction   ESOPHAGOGASTRODUODENOSCOPY  11/29/2007   salmon-colored  tongue   longest stable at  3 cm, distal esophagus as described previously status post biopsy/ Hiatal hernia, otherwise normal stomach D1 and D2   ESOPHAGOGASTRODUODENOSCOPY  01/06/11   short segment Barrett's esophagus s/p bx/Hiatal hernia   ESOPHAGOGASTRODUODENOSCOPY (EGD) WITH PROPOFOL N/A 03/20/2014   [id number] distal esophagus short segment barrett's, bx with no dysplasia. next egd in 03/2017   ESOPHAGOGASTRODUODENOSCOPY (EGD) WITH PROPOFOL N/A 09/14/2015   Dr. Gala Romney: Barrett's without dysplasia, gastritis benign bx, hiatal hernia. next EGD  09/2018.   ESOPHAGOGASTRODUODENOSCOPY (EGD) WITH PROPOFOL N/A 07/23/2019   Procedure: ESOPHAGOGASTRODUODENOSCOPY (EGD) WITH PROPOFOL;  Surgeon: Daneil Dolin, MD;  [facility];  Service: Endoscopy;  Laterality: N/A;  3:00pm   EYE SURGERY N/A    Phreesia 03/18/2020   HERNIA REPAIR Right 07/2010   Dr. Zada Girt   IR IMAGING GUIDED PORT INSERTION  02/09/2021   JOINT REPLACEMENT     LAPAROSCOPIC CHOLECYSTECTOMY  2017   at [facility]   POLYPECTOMY  09/14/2015   Procedure: POLYPECTOMY;  Surgeon: Daneil Dolin, MD;  Location: [facility];  Service: Endoscopy;;  ascending colon   right hip replacement  07/2010   went back in sept 2012 to fix   SHOULDER ARTHROSCOPY  2008   left   SPINE SURGERY N/A    Phreesia 03/18/2020   TOTAL HIP REVISION Right 12/17/2012   Procedure: RIGHT TOTAL HIP REVISION;  Surgeon: Mauri Pole, MD;  Location: [facility];  Service: Orthopedics;  Laterality: Right;   WRIST SURGERY Right 2011   open reduction right wrist.    SOCIAL HISTORY:  Social History   Socioeconomic History   Marital status: Married    Spouse name: louis   Number of children: 4   Years of education: 12+   Highest education level: Some college, no degree  Occupational History   Occupation: Press photographer  - retired   Occupation: non profit  Tobacco Use   Smoking status: Former    Packs/day: 0.25    Years: 25.00    Pack years: 6.25    Types: Cigarettes    Quit date: 02/07/2003    Years since quitting: 18.1   Smokeless tobacco: Never   Tobacco comments:    quit in 2004  Vaping Use   Vaping Use: Never used  Substance and Sexual Activity   Alcohol use: No   Drug use: No   Sexual activity: Not Currently    Birth control/protection: Surgical  Other Topics Concern   Not on file  Social History Narrative   Not on file   Social Determinants of Health   Financial Resource Strain: Low Risk    Difficulty of Paying Living Expenses: Not hard at all  Food Insecurity: No Food  Insecurity   Worried About Charity fundraiser in the Last Year: Never true   [place] in the Last Year: Never true  Transportation Needs: No Transportation Needs   Lack of Transportation (Medical): No   Lack of Transportation (Non-Medical): No  Physical Activity: Insufficiently Active   Days of Exercise per Week: 6 days   Minutes of Exercise per Session: 20 min  Stress: Stress Concern Present   Feeling of Stress : Very much  Social Connections: Moderately Integrated   Frequency of Communication with Friends and Family: More than three times a week   Frequency of Social Gatherings with Friends and Family: Once a week   Attends Religious Services: More than 4 times  per year   Active Member of Clubs or Organizations: No   Attends Archivist Meetings: Never   Marital Status: Married  Human resources officer Violence: Not At Risk   Fear of Current or Ex-Partner: No   Emotionally Abused: No   Physically Abused: No   Sexually Abused: No    FAMILY HISTORY:  Family History  Problem Relation Age of Onset   Hypertension Mother    Stroke Mother    Colon cancer Neg Hx    Anesthesia problems Neg Hx    Hypotension Neg Hx    Malignant hyperthermia Neg Hx    Pseudochol deficiency Neg Hx    Gastric cancer Neg Hx    Esophageal cancer Neg Hx     CURRENT MEDICATIONS:  Current Outpatient Medications  Medication Sig Dispense Refill   acyclovir (ZOVIRAX) 400 MG tablet Take 1 tablet (400 mg total) by mouth 2 (two) times daily. 60 tablet 3   allopurinol (ZYLOPRIM) 300 MG tablet Take 1 tablet (300 mg total) by mouth daily. 30 tablet 3   ALPRAZolam (XANAX) 0.5 MG tablet Take 1 tablet (0.5 mg total) by mouth at bedtime. 30 tablet 5   EPINEPHRINE 0.3 mg/0.3 mL IJ SOAJ injection INJECT 0.3 MLS INTO MUSCLE ONCE AS NEEDED 1 Device 2   gabapentin (NEURONTIN) 300 MG capsule TAKE 1 CAPSULE BY MOUTH ONCE DAILY FOR  CHRONIC  PAIN (Patient taking differently: TAKE 1 CAPSULE BY MOUTH ONCE DAILY FOR   CHRONIC  PAIN) 90 capsule 0   hydrochlorothiazide (HYDRODIURIL) 25 MG tablet Take 1 tablet by mouth once daily 90 tablet 0   lidocaine-prilocaine (EMLA) cream Apply 1 application topically as needed. 30 g 3   losartan (COZAAR) 25 MG tablet Take 1 tablet by mouth once daily for blood pressure 30 tablet 0   meloxicam (MOBIC) 7.5 MG tablet Take 1 tablet (7.5 mg total) by mouth daily as needed for pain (knee pain). 30 tablet 5   mirtazapine (REMERON) 15 MG tablet TAKE 1 TABLET BY MOUTH AT BEDTIME 30 tablet 0   Oxycodone HCl 10 MG TABS Take 1 tablet (10 mg total) by mouth 4 (four) times daily. 56 tablet 0   pantoprazole (PROTONIX) 40 MG tablet TAKE 1 TABLET BY MOUTH ONCE DAILY BEFORE BREAKFAST 90 tablet 3   Probiotic Product (EQL DAILY PROBIOTIC PO) Take 1 capsule by mouth daily.     ruxolitinib phosphate (JAKAFI) 10 MG tablet Take 1 tablet (10 mg total) by mouth 2 (two) times daily. 60 tablet 0   temazepam (RESTORIL) 30 MG capsule Take 1 capsule (30 mg total) by mouth at bedtime as needed for sleep. 30 capsule 5   tiZANidine (ZANAFLEX) 4 MG tablet Take 8 mg by mouth 3 (three) times daily as needed (spasms). 180 tablet 0   valsartan (DIOVAN) 80 MG tablet Take by mouth.     No current facility-administered medications for this visit.    ALLERGIES:  Allergies  Allergen Reactions   Bee Venom Swelling and Hives   Acetaminophen     itches   Amlodipine     Hair loss   Tyloxapol    Other Itching, Rash and Swelling   Oxycodone-Acetaminophen Rash    Pt states, "this gives her a rash, but at home she takes oxycodone for pain relief" Pt states, "this gives her a rash, but at home she takes oxycodone for pain relief"    PHYSICAL EXAM:  Performance status (ECOG): 1 - Symptomatic but completely ambulatory  Vitals:  03/11/21 1049  BP: 109/69  Pulse: 83  Resp: 18  Temp: 98.2 F (36.8 C)  SpO2: 96%   Wt Readings from Last 3 Encounters:  03/11/21 117 lb 6.4 oz (53.3 kg)  02/25/21 119 lb 6.4  oz (54.2 kg)  02/09/21 114 lb (51.7 kg)   Physical Exam Vitals reviewed.  Constitutional:      Appearance: Normal appearance.  Cardiovascular:     Rate and Rhythm: Normal rate and regular rhythm.     Pulses: Normal pulses.     Heart sounds: Normal heart sounds.  Pulmonary:     Effort: Pulmonary effort is normal.     Breath sounds: Normal breath sounds.  Abdominal:     Palpations: Abdomen is soft. There is hepatomegaly (liver margin palpable 2 fingerbreadths below costal margin). There is no splenomegaly or mass.     Tenderness: There is no abdominal tenderness.  Musculoskeletal:     Right lower leg: No edema.     Left lower leg: No edema.  Neurological:     General: No focal deficit present.     Mental Status: She is alert and oriented to person, place, and time.  Psychiatric:        Mood and Affect: Mood normal.        Behavior: Behavior normal.     LABORATORY DATA:  I have reviewed the labs as listed.  CBC Latest Ref Rng & Units 02/25/2021 02/02/2021 01/21/2021  WBC 4.0 - 10.5 K/uL 139.6(HH) 166.3(HH) 101.8(HH)  Hemoglobin 12.0 - 15.0 g/dL 6.4(LL) 7.1(L) 8.6(L)  Hematocrit 36.0 - 46.0 % 19.5(L) 22.1(L) 26.9(L)  Platelets 150 - 400 K/uL 187 193 341   CMP Latest Ref Rng & Units 03/11/2021 02/25/2021 01/21/2021  Glucose 70 - 99 mg/dL 143(H) 114(H) 114(H)  BUN 8 - 23 mg/dL 30(H) 25(H) 25(H)  Creatinine 0.44 - 1.00 mg/dL 1.38(H) 1.32(H) 1.55(H)  Sodium 135 - 145 mmol/L 136 142 140  Potassium 3.5 - 5.1 mmol/L 3.1(L) 3.5 3.5  Chloride 98 - 111 mmol/L 108 110 105  CO2 22 - 32 mmol/L 21(L) 24 24  Calcium 8.9 - 10.3 mg/dL 8.1(L) 8.3(L) 8.7(L)  Total Protein 6.5 - 8.1 g/dL 7.1 6.7 8.6(H)  Total Bilirubin 0.3 - 1.2 mg/dL 0.6 0.5 0.6  Alkaline Phos 38 - 126 U/L 159(H) 179(H) 173(H)  AST 15 - 41 U/L 44(H) 37 35  ALT 0 - 44 U/L 18 15 15     DIAGNOSTIC IMAGING:  I have independently reviewed the scans and discussed with the patient. IR IMAGING GUIDED PORT INSERTION  Result Date:  02/09/2021 INDICATION: 76 year old with poor venous access and MPL positive myeloproliferative neoplasm. EXAM: FLUOROSCOPIC AND ULTRASOUND GUIDED PLACEMENT OF A SUBCUTANEOUS PORT COMPARISON:  None. MEDICATIONS: Moderate sedation ANESTHESIA/SEDATION: Versed 4.0 mg IV; Fentanyl 200 mcg IV; Moderate Sedation Time:  30 minutes The patient was continuously monitored during the procedure by the interventional radiology nurse under my direct supervision. FLUOROSCOPY TIME:  30 seconds, 1 mGy COMPLICATIONS: None immediate. PROCEDURE: The procedure, risks, benefits, and alternatives were explained to the patient. Questions regarding the procedure were encouraged and answered. The patient understands and consents to the procedure. Patient was placed supine on the interventional table. Ultrasound confirmed a patent right internal jugular vein. Ultrasound image was saved for documentation. The right chest and neck were cleaned with a skin antiseptic and a sterile drape was placed. Maximal barrier sterile technique was utilized including caps, mask, sterile gowns, sterile gloves, sterile drape, hand hygiene and skin antiseptic. The  right neck was anesthetized with 1% lidocaine. Small incision was made in the right neck with a blade. Micropuncture set was placed in the right internal jugular vein with ultrasound guidance. The micropuncture wire was used for measurement purposes. The right chest was anesthetized with 1% lidocaine with epinephrine. #15 blade was used to make an incision and a subcutaneous port pocket was formed. [place] was assembled. Subcutaneous tunnel was formed with a stiff tunneling device. The port catheter was brought through the subcutaneous tunnel. The port was placed in the subcutaneous pocket. The micropuncture set was exchanged for a peel-away sheath. The catheter was placed through the peel-away sheath and the tip was positioned at the superior cavoatrial junction. Catheter placement was  confirmed with fluoroscopy. The port was accessed and flushed with heparinized saline. The port pocket was closed using two layers of absorbable sutures and Dermabond. The vein skin site was closed using a single layer of absorbable suture and Dermabond. Sterile dressings were applied. Patient tolerated the procedure well without an immediate complication. Ultrasound and fluoroscopic images were taken and saved for this procedure. IMPRESSION: Placement of a subcutaneous power-injectable port device. Catheter tip at the superior cavoatrial junction. Electronically Signed   By: Markus Daft M.D.   On: 02/09/2021 16:55     ASSESSMENT:  Leukocytosis with left shift: - CBC on 11/06/2020 with white count 75.6, differential 59% neutrophils, 12% monocytes, 4% lymphocytes, 1 percentage of eosinophils and basophils, 6% band neutrophils, 12% myelocytes, 1% promyelocytes, 29% blasts. - Pathologist review of blood smear reported as leukoerythroblastic reaction. - 25 pound weight loss in the last 6 months, unintentional.  Decreased appetite.  Reports fatigue for the last few months.  Reports night sweats x3 in the last 1 month. - Bone marrow biopsy on 11/18/2020 consistent with granulocytic proliferation with differential including CMML versus myeloproliferative disorder. - BCR/ABL was negative. 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 - NGS myeloid panel shows mutations in ASXL1, MPL, TET2, EZH2 mutations. - Spleen ultrasound on 12/16/2020 shows mildly enlarged measuring 12.4 x 9.6 x 7 cm with volume of 435 cc. - PDGFR alpha, beta and FGFR 1 was negative. - She was evaluated by Dr. Florene Glen at [facility].  Slides were reviewed at [facility] hematopathology.  They thought it was less likely CMML and more likely MPL positive myeloproliferative neoplasm.  Dr. Florene Glen has recommended initiate Jakafi. - Ruxolitinib 10 mg twice daily started on 02/16/2021.  2.  Social/family history: - She  lives at home and is able to do all her ADLs and IADLs although she is getting tired lately.  She reports quitting smoking 6 months ago and smoked 1 pack/week for 43 years. - She believes that her mother had some kind of leukemia.  No other malignancies.   PLAN:  MPL positive myeloproliferative neoplasm: - Ruxolitinib started on 02/16/2021 at 10 mg twice daily. - She reports night sweats once per night.  Headaches have resolved. - She reports good energy levels. - Physical examination shows hepatomegaly palpable 2 fingerbreadths below the right costal margin which is stable. - Reviewed labs today which showed white count improved to 124.  Hemoglobin is 9.  Platelet count is 123 which will be closely monitored.  LDH was 959. - Continue current dose of ruxolitinib 10 mg twice daily. - RTC 3 weeks for follow-up with repeat labs.  We will consider uptitrating the dose of ruxolitinib if the counts are stable.  2.  Decreased appetite: -  She has gained 3 pounds since last visit and total of 7 pounds since start of Jakafi.  She is eating better.  3.  CKD: - Baseline creatinine between 1.3-1.5.  Today creatinine 1.38.  Continue follow-up with Dr. Theador Hawthorne.  4.  Hyperuricemia: - Continue allopurinol 300 mg daily.  Uric acid is 3.4 today.  5.  Hypokalemia: - Potassium today is 3.1. - We will start her on potassium 10 mEq daily.   Orders placed this encounter:  No orders of the defined types were placed in this encounter.    Derek Jack, MD [place] [phone]   I, Thana Ates, am acting as a scribe for Dr. Derek Jack.  I, Derek Jack MD, have reviewed the above documentation for accuracy and completeness, and I agree with the above.

## 2021-03-12 ENCOUNTER — Other Ambulatory Visit (HOSPITAL_COMMUNITY): Payer: Self-pay

## 2021-03-17 ENCOUNTER — Other Ambulatory Visit (HOSPITAL_COMMUNITY): Payer: Self-pay

## 2021-03-17 ENCOUNTER — Encounter (HOSPITAL_COMMUNITY): Payer: Self-pay

## 2021-03-17 NOTE — Progress Notes (Signed)
Patient called reporting all over itching, no rash. States that she had to get her pain medication from a different pharmacy and the bottle says roxicodone instead of oxycodone. Patient instructed to take benadryl for the itching and to call pain management for further instructions as they prescribe the medication. Patient verbalized understanding.

## 2021-03-18 ENCOUNTER — Other Ambulatory Visit: Payer: Self-pay

## 2021-03-18 ENCOUNTER — Encounter: Payer: Self-pay | Admitting: Family Medicine

## 2021-03-18 ENCOUNTER — Ambulatory Visit (INDEPENDENT_AMBULATORY_CARE_PROVIDER_SITE_OTHER): Payer: Medicare Other | Admitting: Family Medicine

## 2021-03-18 VITALS — BP 152/81 | HR 73 | Ht 60.0 in | Wt 119.1 lb

## 2021-03-18 DIAGNOSIS — I10 Essential (primary) hypertension: Secondary | ICD-10-CM

## 2021-03-18 DIAGNOSIS — L299 Pruritus, unspecified: Secondary | ICD-10-CM | POA: Diagnosis not present

## 2021-03-18 MED ORDER — METHYLPREDNISOLONE ACETATE 80 MG/ML IJ SUSP
40.0000 mg | Freq: Once | INTRAMUSCULAR | Status: AC
  Administered 2021-03-18: 40 mg via INTRAMUSCULAR

## 2021-03-18 MED ORDER — PREDNISONE 5 MG PO TABS: 5.0000 mg | ORAL_TABLET | Freq: Two times a day (BID) | ORAL | 0 refills | Status: AC

## 2021-03-18 MED ORDER — CETIRIZINE HCL 10 MG PO TABS: 10.0000 mg | ORAL_TABLET | Freq: Every day | ORAL | 0 refills | Status: DC

## 2021-03-18 NOTE — Patient Instructions (Addendum)
Reschedule f/u to mid March, call if you need me sooner  Depo medrol 40 mg IM in office today, and 5 day course of prednisone is prescribed  Return with a driver after lunch, arrange with nurse for benadryl 25 mg iM in office today  Take zyrtec, one daily for itching, this is also sent to your pharmacy  Thanks for choosing [facility], we consider it a privelige to serve you.

## 2021-03-19 ENCOUNTER — Ambulatory Visit (INDEPENDENT_AMBULATORY_CARE_PROVIDER_SITE_OTHER): Payer: Medicare Other

## 2021-03-19 ENCOUNTER — Telehealth: Payer: Medicare Other

## 2021-03-19 DIAGNOSIS — L299 Pruritus, unspecified: Secondary | ICD-10-CM

## 2021-03-19 MED ORDER — DIPHENHYDRAMINE HCL 50 MG/ML IJ SOLN
25.0000 mg | Freq: Once | INTRAMUSCULAR | Status: AC
  Administered 2021-03-19: 25 mg via INTRAMUSCULAR

## 2021-03-19 MED ORDER — DIPHENHYDRAMINE HCL 50 MG/ML IJ SOLN: 25.0000 mg | Freq: Once | INTRAMUSCULAR | Status: DC

## 2021-03-20 ENCOUNTER — Other Ambulatory Visit (HOSPITAL_COMMUNITY): Payer: Self-pay | Admitting: Hematology

## 2021-03-20 ENCOUNTER — Other Ambulatory Visit: Payer: Self-pay | Admitting: Family Medicine

## 2021-03-21 ENCOUNTER — Encounter: Payer: Self-pay | Admitting: Family Medicine

## 2021-03-21 NOTE — Progress Notes (Signed)
° °  Renee Waters     MRN: [id number]      DOB: [birth year]   HPI Renee Waters is here with a 3 day h/o generalized itching, no rash, shortness of breath or wheezing. No new medication or food  Uncertain if from her pain med which is not new Likely a part of her cancer and treatment  ROS Denies recent fever or chills. Denies sinus pressure, nasal congestion, ear pain or sore throat. Denies chest congestion, productive cough or wheezing. Denies chest pains, palpitations and leg swelling Denies abdominal pain, nausea, vomiting,diarrhea or constipation.   Denies dysuria, frequency, hesitancy or incontinence. Denies uncontrolled  joint pain, swelling and limitation in mobility. Denies headaches, seizures, numbness, or tingling. Denies depression, c/.o increased anxiety due to itching insomnia. Denies skin break down or rash.   PE  BP (!) 152/81    Pulse 73    Ht 5' (1.524 m)    Wt 119 lb 1.9 oz (54 kg)    SpO2 98%    BMI 23.26 kg/m   Patient alert and oriented and in no cardiopulmonary distress.  HEENT: No facial asymmetry, EOMI,     Neck supple .  Chest: Clear to auscultation bilaterally.  CVS: S1, S2 no murmurs, no S3.Regular rate.  ABD: Soft non tender.   Ext: No edema  MS: decreased  ROM spine, shoulders, hips and knees.  Skin: Intact, no ulcerations or rash noted.  Psych: Good eye contact, normal affect. Memory intact not anxious or depressed appearing.  CNS: CN 2-12 intact, power,  normal throughout.no focal deficits noted.   Assessment & Plan  Itching Reports generalized itching, unsure of of aggravant,no rash, may be related to her malignancy. Depo medrol and Benadryl to be given in the office, ensure she has a driver before benadryl given had to return the next day for benadryl , short course of prednisone prescribed also  Essential hypertension DASH diet and commitment to daily physical activity for a minimum of 30 minutes discussed and encouraged, as a part of  hypertension management. The importance of attaining a healthy weight is also discussed. Elevated at visit, but anxious , recent readings have been normal, no med change  BP/Weight 03/18/2021 03/11/2021 02/26/2021 02/25/2021 02/09/2021 02/03/2021 34/74/2595  Systolic BP 638 756 433 295 188 416 606  Diastolic BP 81 69 78 64 79 75 89  Wt. (Lbs) 119.12 117.4 - 119.4 114 115.52 -  BMI 23.26 19.54 - 20.49 19.57 19.25 -  Some encounter information is confidential and restricted. Go to Review Flowsheets activity to see all data.

## 2021-03-21 NOTE — Assessment & Plan Note (Signed)
Reports generalized itching, unsure of of aggravant,no rash, may be related to her malignancy. Depo medrol and Benadryl to be given in the office, ensure she has a driver before benadryl given had to return the next day for benadryl , short course of prednisone prescribed also

## 2021-03-21 NOTE — Assessment & Plan Note (Signed)
DASH diet and commitment to daily physical activity for a minimum of 30 minutes discussed and encouraged, as a part of hypertension management. The importance of attaining a healthy weight is also discussed. Elevated at visit, but anxious , recent readings have been normal, no med change  BP/Weight 03/18/2021 03/11/2021 02/26/2021 02/25/2021 02/09/2021 02/03/2021 12/08/5943  Systolic BP 859 292 446 286 381 771 165  Diastolic BP 81 69 78 64 79 75 89  Wt. (Lbs) 119.12 117.4 - 119.4 114 115.52 -  BMI 23.26 19.54 - 20.49 19.57 19.25 -  Some encounter information is confidential and restricted. Go to Review Flowsheets activity to see all data.

## 2021-03-24 ENCOUNTER — Telehealth: Payer: Medicare Other

## 2021-03-25 DIAGNOSIS — G894 Chronic pain syndrome: Secondary | ICD-10-CM | POA: Diagnosis not present

## 2021-03-25 DIAGNOSIS — M47816 Spondylosis without myelopathy or radiculopathy, lumbar region: Secondary | ICD-10-CM | POA: Diagnosis not present

## 2021-03-25 DIAGNOSIS — Z79891 Long term (current) use of opiate analgesic: Secondary | ICD-10-CM | POA: Diagnosis not present

## 2021-03-25 DIAGNOSIS — M47812 Spondylosis without myelopathy or radiculopathy, cervical region: Secondary | ICD-10-CM | POA: Diagnosis not present

## 2021-03-30 ENCOUNTER — Encounter: Payer: Self-pay | Admitting: Family Medicine

## 2021-03-30 ENCOUNTER — Ambulatory Visit (INDEPENDENT_AMBULATORY_CARE_PROVIDER_SITE_OTHER): Payer: Medicare Other | Admitting: Family Medicine

## 2021-03-30 ENCOUNTER — Other Ambulatory Visit: Payer: Self-pay

## 2021-03-30 VITALS — BP 129/75 | HR 63 | Ht 60.0 in | Wt 121.0 lb

## 2021-03-30 DIAGNOSIS — L299 Pruritus, unspecified: Secondary | ICD-10-CM

## 2021-03-30 DIAGNOSIS — I10 Essential (primary) hypertension: Secondary | ICD-10-CM | POA: Diagnosis not present

## 2021-03-30 DIAGNOSIS — R52 Pain, unspecified: Secondary | ICD-10-CM

## 2021-03-30 DIAGNOSIS — Z1231 Encounter for screening mammogram for malignant neoplasm of breast: Secondary | ICD-10-CM | POA: Diagnosis not present

## 2021-03-30 DIAGNOSIS — D72829 Elevated white blood cell count, unspecified: Secondary | ICD-10-CM

## 2021-03-30 DIAGNOSIS — F331 Major depressive disorder, recurrent, moderate: Secondary | ICD-10-CM

## 2021-03-30 DIAGNOSIS — M542 Cervicalgia: Secondary | ICD-10-CM

## 2021-03-30 DIAGNOSIS — F419 Anxiety disorder, unspecified: Secondary | ICD-10-CM

## 2021-03-30 MED ORDER — CETIRIZINE HCL 10 MG PO TABS: 10.0000 mg | ORAL_TABLET | Freq: Every day | ORAL | 11 refills | Status: DC

## 2021-03-30 MED ORDER — GABAPENTIN 300 MG PO CAPS: ORAL_CAPSULE | ORAL | 1 refills | Status: DC

## 2021-03-30 NOTE — Patient Instructions (Addendum)
F/u as before, call if you need me sooner  Thankful that you are doing better in terms of sleep and weight gain  Take zyrtec every day  Gabapentin is prescribed for as needed use as we discussed   I will reach out to a Therapist  for you   Thanks for choosing [facility], we consider it a privelige to serve you.   Please schedule March 8 or after mammogram at checkout

## 2021-03-31 ENCOUNTER — Other Ambulatory Visit: Payer: Self-pay | Admitting: Family Medicine

## 2021-04-02 ENCOUNTER — Ambulatory Visit (INDEPENDENT_AMBULATORY_CARE_PROVIDER_SITE_OTHER): Payer: Medicare Other

## 2021-04-02 DIAGNOSIS — M159 Polyosteoarthritis, unspecified: Secondary | ICD-10-CM

## 2021-04-02 DIAGNOSIS — F418 Other specified anxiety disorders: Secondary | ICD-10-CM

## 2021-04-02 DIAGNOSIS — I10 Essential (primary) hypertension: Secondary | ICD-10-CM

## 2021-04-02 DIAGNOSIS — D649 Anemia, unspecified: Secondary | ICD-10-CM

## 2021-04-02 DIAGNOSIS — M150 Primary generalized (osteo)arthritis: Secondary | ICD-10-CM

## 2021-04-02 NOTE — Patient Instructions (Addendum)
Visit Information  Patient Goals  Begin and Stick with counseling for depression  Timeframe:  Long-Range Goal Priority:  High Progress:  On Track Start Date:      04/02/21                      Expected End Date:  06/28/21    Follow Up Date  05/21/21 at 2:00 PM   - Begin and Stick with Counseling for Depression   Why is this important?   Beating depression may take some time.  If you don't feel better right away, don't give up on your treatment plan.    Patient Self Care Activities:  Self administers medications as prescribed Attends church or other social activities Performs ADL's independently Calls provider office for new concerns or questions Ability for insight Independent living Motivation for treatment Strong family or social support  Patient Coping Strengths:  Supportive Relationships Family Friends Hopefulness Self Advocate Able to Communicate Effectively  Patient Self Care Deficits:  Depression issues Walking challenges   Patient Goals:  Allow time for self care, such as taking a nap, eating meals on schedule, spending time with a hobby or involved in phone calls with family members Attend scheduled medical appointments in next 30 days  Follow Up Plan: LCSW to call client on 05/21/21 at 2:00 PM   [employer].Amamda Curbow MSW, Kermit Holiday representative [facility] Care Management [phone]

## 2021-04-02 NOTE — Chronic Care Management (AMB) (Signed)
Chronic Care Management    Clinical Social Work Note  04/02/2021 Name: Renee Waters MRN: [id number] DOB: [birth year]  Renee Waters is a 76 y.o. year old female who is a primary care patient of Moshe Cipro Norwood Levo, MD. The CCM team was consulted to assist the patient with chronic disease management and/or care coordination needs related to: [employer] .   Engaged with patient by telephone for follow up visit in response to provider referral for social work chronic care management and care coordination services.   Consent to Services:  The patient was given information about Chronic Care Management services, agreed to services, and gave verbal consent prior to initiation of services.  Please see initial visit note for detailed documentation.   Patient agreed to services and consent obtained.   Assessment: Review of patient past medical history, allergies, medications, and health status, including review of relevant consultants reports was performed today as part of a comprehensive evaluation and provision of chronic care management and care coordination services.     SDOH (Social Determinants of Health) assessments and interventions performed:  SDOH Interventions    Flowsheet Row Most Recent Value  SDOH Interventions   Physical Activity Interventions Other (Comments)  [walking challenges. uses a cane or a walker to ambulate]  Stress Interventions Provide Counseling  [client has stress related to managing medical needs]  Depression Interventions/Treatment  Counseling, Medication        Advanced Directives Status: See Vynca application for related entries.  CCM Care Plan  Allergies  Allergen Reactions   Bee Venom Swelling and Hives   Acetaminophen     itches   Amlodipine     Hair loss   Tyloxapol    Other Itching, Rash and Swelling   Oxycodone-Acetaminophen Rash    Pt states, "this gives her a rash, but at home she takes oxycodone for pain relief" Pt states, "this  gives her a rash, but at home she takes oxycodone for pain relief"    Outpatient Encounter Medications as of 04/02/2021  Medication Sig Note   acyclovir (ZOVIRAX) 400 MG tablet Take 1 tablet (400 mg total) by mouth 2 (two) times daily. 10/28/2020: Pt reports taking med as needed.    allopurinol (ZYLOPRIM) 300 MG tablet Take 1 tablet by mouth once daily    ALPRAZolam (XANAX) 0.5 MG tablet Take 1 tablet (0.5 mg total) by mouth at bedtime.    cetirizine (ZYRTEC) 10 MG tablet Take 1 tablet (10 mg total) by mouth daily.    EPINEPHRINE 0.3 mg/0.3 mL IJ SOAJ injection INJECT 0.3 MLS INTO MUSCLE ONCE AS NEEDED    gabapentin (NEURONTIN) 300 MG capsule Take one capsule by mouth once daily, as needed, for pain    hydrochlorothiazide (HYDRODIURIL) 25 MG tablet Take 1 tablet by mouth once daily    lidocaine-prilocaine (EMLA) cream Apply 1 application topically as needed. 02/09/2021: Not started yet   losartan (COZAAR) 25 MG tablet Take 1 tablet by mouth once daily for blood pressure    meloxicam (MOBIC) 7.5 MG tablet Take 1 tablet (7.5 mg total) by mouth daily as needed for pain (knee pain).    mirtazapine (REMERON) 15 MG tablet TAKE 1 TABLET BY MOUTH AT BEDTIME    Oxycodone HCl 10 MG TABS Take 1 tablet (10 mg total) by mouth 4 (four) times daily.    pantoprazole (PROTONIX) 40 MG tablet TAKE 1 TABLET BY MOUTH ONCE DAILY BEFORE BREAKFAST    potassium chloride (KLOR-CON) 10 MEQ tablet Take by mouth. (Patient  not taking: Reported on 03/30/2021)    potassium chloride SA (KLOR-CON M) 10 MEQ tablet Take 1 tablet (10 mEq total) by mouth daily.    Probiotic Product (EQL DAILY PROBIOTIC PO) Take 1 capsule by mouth daily. (Patient not taking: Reported on 03/30/2021)    ruxolitinib phosphate (JAKAFI) 10 MG tablet Take 1 tablet (10 mg total) by mouth 2 (two) times daily.    temazepam (RESTORIL) 30 MG capsule Take 1 capsule (30 mg total) by mouth at bedtime as needed for sleep.    tiZANidine (ZANAFLEX) 4 MG tablet Take 8 mg  by mouth 3 (three) times daily as needed (spasms).    valsartan (DIOVAN) 80 MG tablet Take by mouth.    No facility-administered encounter medications on file as of 04/02/2021.    Patient Active Problem List   Diagnosis Date Noted   Influenza-like symptoms 01/29/2021   Fever 01/21/2021   Leukocytosis 11/09/2020   CKD (chronic kidney disease) stage 3, GFR 30-59 ml/min (HCC) 10/29/2020   Dermatomycosis 08/02/2020   HSV-2 (herpes simplex virus 2) infection 08/02/2020   Sore throat 07/14/2020   Upper respiratory tract infection 07/09/2020   Rib pain on left side 03/21/2020   Itching 03/21/2020   Mildly underweight adult 09/19/2019   MDD (major depressive disorder), recurrent, in full remission ([place]) 03/27/2019   Educated about COVID-19 virus infection 06/24/2018   MDD (major depressive disorder), recurrent episode, moderate ([place]) 10/28/2016   Thoracic spine pain 08/31/2016   Anemia, normocytic normochromic 05/27/2015   IDA (iron deficiency anemia) 03/02/2015   Unsteady gait 02/25/2015   Mucosal abnormality of esophagus    Systemic lupus ([place]) 10/31/2012   Vitamin D deficiency 03/16/2012   Generalized pain 03/15/2012   Constipation 10/06/2011   Hip pain 06/29/2011   Barrett's esophagus 12/13/2010   Western blot positive HSV2 06/22/2007   Depression 06/22/2007   Essential hypertension 06/22/2007   GERD (gastroesophageal reflux disease) 06/22/2007   DJD (degenerative joint disease) 06/22/2007   NECK PAIN, CHRONIC 06/22/2007   Chronic midline low back pain with sciatica 06/22/2007   Insomnia secondary to depression with anxiety 06/22/2007    Conditions to be addressed/monitored: monitor client management of depression issues  Care Plan : LCSW Plan of Care  Updates made by Katha Cabal, LCSW since 04/02/2021 12:00 AM     Problem: Symptoms (Depression)      Long-Range Goal: Symptoms Monitored and Managed. Manage Depression issues   Start Date: 04/02/2021  Expected End  Date: 06/28/2021  This Visit's Progress: On track  Recent Progress: On track  Priority: High  Note:   Current Barriers:  Chronic Mental Health needs related to depression, anxiety Mental Health Concerns  Walking challenges Pain issues Suicidal Ideation/Homicidal Ideation: No  Clinical Social Work Goal(s):  patient will work with SW  as needed  by telephone  to reduce or manage symptoms related to depression demonstrate a reduction in symptoms related to :Anxiety and Depression  Patient to communicate as needed with RNCM in next 30 days to  discuss nursing needs of client  Interventions:  Discussed client needs with [name] Discussed appetite of client and sleeping issues of client Discussed mood of client. She said she is trying to stay in positive mood. She said she is taking two prescribed medications for leukemia. She said she sees oncologist monthly to manage leukemia needs.  Discussed energy level of client Discussed ambulation of client. She uses a cane or walker to help her walk Used Active Listening techniques to  allow client to express her feelings and concerns. Provided counseling support for Caralyn Encouraged client to call RNCM as needed to discuss nursing needs of client Reviewed family support. She has support from her spouse and from her adult children Discussed her support through [facility] Encouraged client to call LCSW as needed to discuss SW needs of client 1:1 collaboration with [name], MD regarding development and update of comprehensive plan of care as evidenced by provider attestation and co-signature  Patient Self Care Activities:  Self administers medications as prescribed Attends church or other social activities Performs ADL's independently Calls provider office for new concerns or questions Ability for insight Independent living Motivation for treatment Strong family or social support  Patient Coping Strengths:   Supportive Relationships Family Friends Hopefulness Self Advocate Able to Communicate Effectively  Patient Self Care Deficits:  Depression issues Walking challenges   Patient Goals:  Allow time for self care, such as taking a nap, eating meals on schedule, spending time with a hobby or involved in phone calls with family members Attend scheduled medical appointments in next 30 days   Follow Up Plan: LCSW to call client on 05/21/21 at 2:00 PM      [employer].Katherleen Folkes MSW, Vona Holiday representative [facility] Care Management [phone]

## 2021-04-05 ENCOUNTER — Other Ambulatory Visit: Payer: Self-pay

## 2021-04-05 ENCOUNTER — Inpatient Hospital Stay (HOSPITAL_COMMUNITY): Payer: Medicare Other

## 2021-04-05 ENCOUNTER — Encounter: Payer: Self-pay | Admitting: Family Medicine

## 2021-04-05 ENCOUNTER — Encounter (HOSPITAL_COMMUNITY): Payer: Self-pay | Admitting: Hematology

## 2021-04-05 ENCOUNTER — Inpatient Hospital Stay (HOSPITAL_BASED_OUTPATIENT_CLINIC_OR_DEPARTMENT_OTHER): Payer: Medicare Other | Admitting: Hematology

## 2021-04-05 VITALS — BP 133/61 | HR 65 | Temp 98.9°F | Resp 18 | Ht 64.57 in | Wt 127.8 lb

## 2021-04-05 DIAGNOSIS — C946 Myelodysplastic disease, not classified: Secondary | ICD-10-CM | POA: Diagnosis not present

## 2021-04-05 DIAGNOSIS — E790 Hyperuricemia without signs of inflammatory arthritis and tophaceous disease: Secondary | ICD-10-CM | POA: Diagnosis not present

## 2021-04-05 DIAGNOSIS — F419 Anxiety disorder, unspecified: Secondary | ICD-10-CM | POA: Insufficient documentation

## 2021-04-05 DIAGNOSIS — R630 Anorexia: Secondary | ICD-10-CM | POA: Diagnosis not present

## 2021-04-05 DIAGNOSIS — D72829 Elevated white blood cell count, unspecified: Secondary | ICD-10-CM

## 2021-04-05 DIAGNOSIS — R160 Hepatomegaly, not elsewhere classified: Secondary | ICD-10-CM | POA: Diagnosis not present

## 2021-04-05 DIAGNOSIS — R1012 Left upper quadrant pain: Secondary | ICD-10-CM | POA: Diagnosis not present

## 2021-04-05 DIAGNOSIS — Z79899 Other long term (current) drug therapy: Secondary | ICD-10-CM | POA: Diagnosis not present

## 2021-04-05 DIAGNOSIS — N183 Chronic kidney disease, stage 3 unspecified: Secondary | ICD-10-CM | POA: Diagnosis not present

## 2021-04-05 LAB — CBC WITH DIFFERENTIAL/PLATELET
Band Neutrophils: 11 %
Basophils Absolute: 0 10*3/uL (ref 0.0–0.1)
Basophils Relative: 0 %
Blasts: 2 %
Eosinophils Absolute: 0 10*3/uL (ref 0.0–0.5)
Eosinophils Relative: 0 %
HCT: 21.1 % — ABNORMAL LOW (ref 36.0–46.0)
Hemoglobin: 6.7 g/dL — CL (ref 12.0–15.0)
Lymphocytes Relative: 4 %
Lymphs Abs: 4.2 10*3/uL — ABNORMAL HIGH (ref 0.7–4.0)
MCH: 29.3 pg (ref 26.0–34.0)
MCHC: 31.8 g/dL (ref 30.0–36.0)
MCV: 92.1 fL (ref 80.0–100.0)
Metamyelocytes Relative: 9 %
Monocytes Absolute: 8.3 10*3/uL — ABNORMAL HIGH (ref 0.1–1.0)
Monocytes Relative: 8 %
Myelocytes: 5 %
Neutro Abs: 71.8 10*3/uL — ABNORMAL HIGH (ref 1.7–7.7)
Neutrophils Relative %: 58 %
Platelets: 109 10*3/uL — ABNORMAL LOW (ref 150–400)
Promyelocytes Relative: 3 %
RBC: 2.29 MIL/uL — ABNORMAL LOW (ref 3.87–5.11)
RDW: 23.9 % — ABNORMAL HIGH (ref 11.5–15.5)
WBC: 104 10*3/uL (ref 4.0–10.5)
nRBC: 1 /100 WBC — ABNORMAL HIGH
nRBC: 1.5 % — ABNORMAL HIGH (ref 0.0–0.2)

## 2021-04-05 LAB — URIC ACID: Uric Acid, Serum: 3.9 mg/dL (ref 2.5–7.1)

## 2021-04-05 LAB — LACTATE DEHYDROGENASE: LDH: 597 U/L — ABNORMAL HIGH (ref 98–192)

## 2021-04-05 LAB — COMPREHENSIVE METABOLIC PANEL
ALT: 19 U/L (ref 0–44)
AST: 35 U/L (ref 15–41)
Albumin: 3.8 g/dL (ref 3.5–5.0)
Alkaline Phosphatase: 113 U/L (ref 38–126)
Anion gap: 5 (ref 5–15)
BUN: 29 mg/dL — ABNORMAL HIGH (ref 8–23)
CO2: 23 mmol/L (ref 22–32)
Calcium: 8.2 mg/dL — ABNORMAL LOW (ref 8.9–10.3)
Chloride: 109 mmol/L (ref 98–111)
Creatinine, Ser: 1.19 mg/dL — ABNORMAL HIGH (ref 0.44–1.00)
GFR, Estimated: 48 mL/min — ABNORMAL LOW (ref >60)
Glucose, Bld: 86 mg/dL (ref 70–99)
Potassium: 3.8 mmol/L (ref 3.5–5.1)
Sodium: 137 mmol/L (ref 135–145)
Total Bilirubin: 0.7 mg/dL (ref 0.3–1.2)
Total Protein: 7.3 g/dL (ref 6.5–8.1)

## 2021-04-05 LAB — PREPARE RBC (CROSSMATCH)

## 2021-04-05 LAB — SAMPLE TO BLOOD BANK

## 2021-04-05 MED ORDER — SODIUM CHLORIDE 0.9% FLUSH
10.0000 mL | Freq: Once | INTRAVENOUS | Status: AC
  Administered 2021-04-05: 10 mL via INTRAVENOUS

## 2021-04-05 MED ORDER — HEPARIN SOD (PORK) LOCK FLUSH 100 UNIT/ML IV SOLN
500.0000 [IU] | Freq: Once | INTRAVENOUS | Status: AC
  Administered 2021-04-05: 500 [IU] via INTRAVENOUS

## 2021-04-05 NOTE — Patient Instructions (Signed)
[facility]  Discharge Instructions: Thank you for choosing [facility] to provide your oncology and hematology care.  If you have a lab appointment with the [facility], please come in thru the Main Entrance and check in at the main information desk.  Wear comfortable clothing and clothing appropriate for easy access to any Portacath or PICC line.   We strive to give you quality time with your provider. You may need to reschedule your appointment if you arrive late (15 or more minutes).  Arriving late affects you and other patients whose appointments are after yours.  Also, if you miss three or more appointments without notifying the office, you may be dismissed from the clinic at the providers discretion.      For prescription refill requests, have your pharmacy contact our office and allow 72 hours for refills to be completed.    Today you received the following chemotherapy and/or immunotherapy agents [place]      To help prevent nausea and vomiting after your treatment, we encourage you to take your nausea medication as directed.  BELOW ARE SYMPTOMS THAT SHOULD BE REPORTED IMMEDIATELY: *FEVER GREATER THAN 100.4 F (38 C) OR HIGHER *CHILLS OR SWEATING *NAUSEA AND VOMITING THAT IS NOT CONTROLLED WITH YOUR NAUSEA MEDICATION *UNUSUAL SHORTNESS OF BREATH *UNUSUAL BRUISING OR BLEEDING *URINARY PROBLEMS (pain or burning when urinating, or frequent urination) *BOWEL PROBLEMS (unusual diarrhea, constipation, pain near the anus) TENDERNESS IN MOUTH AND THROAT WITH OR WITHOUT PRESENCE OF ULCERS (sore throat, sores in mouth, or a toothache) UNUSUAL RASH, SWELLING OR PAIN  UNUSUAL VAGINAL DISCHARGE OR ITCHING   Items with * indicate a potential emergency and should be followed up as soon as possible or go to the Emergency Department if any problems should occur.  Please show the CHEMOTHERAPY ALERT CARD or IMMUNOTHERAPY ALERT CARD at check-in to the Emergency  Department and triage nurse.  Should you have questions after your visit or need to cancel or reschedule your appointment, please contact [facility] [phone]  and follow the prompts.  Office hours are 8:00 a.m. to 4:30 p.m. Monday - Friday. Please note that voicemails left after 4:00 p.m. may not be returned until the following business day.  We are closed weekends and major holidays. You have access to a nurse at all times for urgent questions. Please call the main number to the clinic [phone] and follow the prompts.  For any non-urgent questions, you may also contact your provider using MyChart. We now offer e-Visits for anyone 37 and older to request care online for non-urgent symptoms. For details visit [email or web address].   Also download the MyChart app! Go to the app store, search "MyChart", open the app, select [facility], and log in with your MyChart username and password.  Due to Covid, a mask is required upon entering the hospital/clinic. If you do not have a mask, one will be given to you upon arrival. For doctor visits, patients may have 1 support person aged 54 or older with them. For treatment visits, patients cannot have anyone with them due to current Covid guidelines and our immunocompromised population.

## 2021-04-05 NOTE — Assessment & Plan Note (Signed)
Reports uncontrolled pinat times, gabapentin prescribed for as needed use one daily

## 2021-04-05 NOTE — Assessment & Plan Note (Signed)
Managed by pain mangement

## 2021-04-05 NOTE — Assessment & Plan Note (Signed)
Good response to zyrtec, advised to use daily

## 2021-04-05 NOTE — Progress Notes (Signed)
[place], [place] [address]   CLINIC:  Medical Oncology/Hematology  PCP:  Renee Helper, MD [address] [place] / [place] [address]  [phone]  REASON FOR VISIT:  Follow-up for MPL positive myeloproliferative neoplasm  PRIOR THERAPY: none  CURRENT THERAPY: Jakafi 10 mg twice daily  INTERVAL HISTORY:  [name], a 76 y.o. female, returns for routine follow-up for her MPL positive myeloproliferative neoplasm. [name] was last seen on 03/11/2021.  Today she reports feeling well. She denies fatigue. She reports soreness at her port site. She has gained 10 lbs since her last visit. She denies n/v/d, and she reports abdominal bloating and constipation. She reports BM twice daily, and her last BM was yesterday morning. She reports abdominal pain when bending forward which has worsened over the past 3 weeks. Her night sweats have improved and she now has them 2-3 times weekly. She denies headaches. Her appetite has decreased.   REVIEW OF SYSTEMS:  Review of Systems  Constitutional:  Positive for fatigue. Negative for appetite change.  Respiratory:  Positive for cough and shortness of breath.   Gastrointestinal:  Positive for abdominal pain and constipation. Negative for diarrhea, nausea and vomiting.  Endocrine: Positive for hot flashes (night sweats - improved).  Musculoskeletal:  Positive for arthralgias.  Neurological:  Positive for headaches.  All other systems reviewed and are negative.  PAST MEDICAL/SURGICAL HISTORY:  Past Medical History:  Diagnosis Date   Acute cholangitis    Allergy    Anemia    Anxiety    Arthritis    Phreesia 03/18/2020   Barrett's esophagus    Cataract    Chronic back pain    Chronic neck pain    CKD (chronic kidney disease) stage 3, GFR 30-59 ml/min ([place]) 10/29/2020   Depression    Genital herpes    GERD (gastroesophageal reflux disease)    H/O degenerative disc disease     History of blood transfusion    Hypertension    Insomnia    Lupus (systemic lupus erythematosus) ([place])    Neuromuscular disorder ([place])    Osteoarthritis    S/P colonoscopy June 2005   normal, no polyps   S/P endoscopy June 2005, Oct 2009   2005: short-segment Barrett's, 2009: short-segment Barrett's   Upper respiratory tract infection 07/09/2020   UTI (lower urinary tract infection) 11/2012   Past Surgical History:  Procedure Laterality Date   ABDOMINAL HYSTERECTOMY     BACK SURGERY     BIOPSY N/A 03/20/2014   Procedure: BIOPSY;  Surgeon: Daneil Dolin, MD;  Location: [facility];  Service: Endoscopy;  Laterality: N/A;   BIOPSY  09/14/2015   Procedure: BIOPSY;  Surgeon: Daneil Dolin, MD;  Location: [facility];  Service: Endoscopy;;  esophageal and gastric   BIOPSY  07/23/2019   Procedure: BIOPSY;  Surgeon: Daneil Dolin, MD;  Location: [facility];  Service: Endoscopy;;  esophageal    CARPAL TUNNEL RELEASE Left 2013   cervical disectomy  2002   CESAREAN SECTION N/A    Phreesia 03/18/2020   CHOLECYSTECTOMY     with lysis of adhesions for sbo; "ruptured gallbladder".   COLONOSCOPY  11/09/2011   RMR: Melanosis coli   COLONOSCOPY WITH PROPOFOL N/A 09/14/2015   Dr. Gala Romney: diverticulosis, 68m TA removed. next TCS 09/2020.    COLONOSCOPY WITH PROPOFOL N/A 04/30/2020   Procedure: COLONOSCOPY WITH PROPOFOL;  Surgeon: RDaneil Dolin MD;  [facility]:  [facility];  Service: Endoscopy;  Laterality: N/A;  PM (ASA 3)   DENTAL SURGERY  11/2015   multiple tooth extraction   ESOPHAGOGASTRODUODENOSCOPY  11/29/2007   salmon-colored  tongue   longest stable at  3 cm, distal esophagus as described previously status post biopsy/ Hiatal hernia, otherwise normal stomach D1 and D2   ESOPHAGOGASTRODUODENOSCOPY  01/06/11   short segment Barrett's esophagus s/p bx/Hiatal hernia   ESOPHAGOGASTRODUODENOSCOPY (EGD) WITH PROPOFOL N/A 03/20/2014   [id number] distal esophagus short segment barrett's, bx with no  dysplasia. next egd in 03/2017   ESOPHAGOGASTRODUODENOSCOPY (EGD) WITH PROPOFOL N/A 09/14/2015   Dr. Gala Romney: Barrett's without dysplasia, gastritis benign bx, hiatal hernia. next EGD 09/2018.   ESOPHAGOGASTRODUODENOSCOPY (EGD) WITH PROPOFOL N/A 07/23/2019   Procedure: ESOPHAGOGASTRODUODENOSCOPY (EGD) WITH PROPOFOL;  Surgeon: Daneil Dolin, MD;  [facility];  Service: Endoscopy;  Laterality: N/A;  3:00pm   EYE SURGERY N/A    Phreesia 03/18/2020   HERNIA REPAIR Right 07/2010   Dr. Zada Girt   IR IMAGING GUIDED PORT INSERTION  02/09/2021   JOINT REPLACEMENT     LAPAROSCOPIC CHOLECYSTECTOMY  2017   at [facility]   POLYPECTOMY  09/14/2015   Procedure: POLYPECTOMY;  Surgeon: Daneil Dolin, MD;  Location: [facility];  Service: Endoscopy;;  ascending colon   right hip replacement  07/2010   went back in sept 2012 to fix   SHOULDER ARTHROSCOPY  2008   left   SPINE SURGERY N/A    Phreesia 03/18/2020   TOTAL HIP REVISION Right 12/17/2012   Procedure: RIGHT TOTAL HIP REVISION;  Surgeon: Mauri Pole, MD;  Location: [facility];  Service: Orthopedics;  Laterality: Right;   WRIST SURGERY Right 2011   open reduction right wrist.    SOCIAL HISTORY:  Social History   Socioeconomic History   Marital status: Married    Spouse name: louis   Number of children: 4   Years of education: 12+   Highest education level: Some college, no degree  Occupational History   Occupation: Press photographer  - retired   Occupation: non profit  Tobacco Use   Smoking status: Former    Packs/day: 0.25    Years: 25.00    Pack years: 6.25    Types: Cigarettes    Quit date: 02/07/2003    Years since quitting: 18.1   Smokeless tobacco: Never   Tobacco comments:    quit in 2004  Vaping Use   Vaping Use: Never used  Substance and Sexual Activity   Alcohol use: No   Drug use: No   Sexual activity: Not Currently    Birth control/protection: Surgical  Other Topics Concern   Not on file  Social History Narrative    Not on file   Social Determinants of Health   Financial Resource Strain: Low Risk    Difficulty of Paying Living Expenses: Not hard at all  Food Insecurity: No Food Insecurity   Worried About Charity fundraiser in the Last Year: Never true   [place] in the Last Year: Never true  Transportation Needs: No Transportation Needs   Lack of Transportation (Medical): No   Lack of Transportation (Non-Medical): No  Physical Activity: Inactive   Days of Exercise per Week: 0 days   Minutes of Exercise per Session: 0 min  Stress: Stress Concern Present   Feeling of Stress : To some extent  Social Connections: Moderately Integrated   Frequency of Communication with Friends and Family:  More than three times a week   Frequency of Social Gatherings with Friends and Family: Once a week   Attends Religious Services: More than 4 times per year   Active Member of [employer] or Organizations: No   Attends Music therapist: Never   Marital Status: Married  Human resources officer Violence: Not At Risk   Fear of Current or Ex-Partner: No   Emotionally Abused: No   Physically Abused: No   Sexually Abused: No    FAMILY HISTORY:  Family History  Problem Relation Age of Onset   Hypertension Mother    Stroke Mother    Colon cancer Neg Hx    Anesthesia problems Neg Hx    Hypotension Neg Hx    Malignant hyperthermia Neg Hx    Pseudochol deficiency Neg Hx    Gastric cancer Neg Hx    Esophageal cancer Neg Hx     CURRENT MEDICATIONS:  Current Outpatient Medications  Medication Sig Dispense Refill   acyclovir (ZOVIRAX) 400 MG tablet Take 1 tablet (400 mg total) by mouth 2 (two) times daily. 60 tablet 3   allopurinol (ZYLOPRIM) 300 MG tablet Take 1 tablet by mouth once daily 30 tablet 0   ALPRAZolam (XANAX) 0.5 MG tablet Take 1 tablet (0.5 mg total) by mouth at bedtime. 30 tablet 5   cetirizine (ZYRTEC) 10 MG tablet Take 1 tablet (10 mg total) by mouth daily. 30 tablet 11   EPINEPHRINE 0.3  mg/0.3 mL IJ SOAJ injection INJECT 0.3 MLS INTO MUSCLE ONCE AS NEEDED 1 Device 2   gabapentin (NEURONTIN) 300 MG capsule Take one capsule by mouth once daily, as needed, for pain 90 capsule 1   hydrochlorothiazide (HYDRODIURIL) 25 MG tablet Take 1 tablet by mouth once daily 90 tablet 0   lidocaine-prilocaine (EMLA) cream Apply 1 application topically as needed. 30 g 3   losartan (COZAAR) 25 MG tablet Take 1 tablet by mouth once daily for blood pressure 30 tablet 0   meloxicam (MOBIC) 7.5 MG tablet Take 1 tablet (7.5 mg total) by mouth daily as needed for pain (knee pain). 30 tablet 5   mirtazapine (REMERON) 15 MG tablet TAKE 1 TABLET BY MOUTH AT BEDTIME 30 tablet 0   Oxycodone HCl 10 MG TABS Take 1 tablet (10 mg total) by mouth 4 (four) times daily. 56 tablet 0   pantoprazole (PROTONIX) 40 MG tablet TAKE 1 TABLET BY MOUTH ONCE DAILY BEFORE BREAKFAST 90 tablet 3   potassium chloride (KLOR-CON) 10 MEQ tablet Take by mouth. (Patient not taking: Reported on 03/30/2021)     potassium chloride SA (KLOR-CON M) 10 MEQ tablet Take 1 tablet (10 mEq total) by mouth daily. 30 tablet 3   Probiotic Product (EQL DAILY PROBIOTIC PO) Take 1 capsule by mouth daily. (Patient not taking: Reported on 03/30/2021)     ruxolitinib phosphate (JAKAFI) 10 MG tablet Take 1 tablet (10 mg total) by mouth 2 (two) times daily. 60 tablet 0   temazepam (RESTORIL) 30 MG capsule Take 1 capsule (30 mg total) by mouth at bedtime as needed for sleep. 30 capsule 5   tiZANidine (ZANAFLEX) 4 MG tablet Take 8 mg by mouth 3 (three) times daily as needed (spasms). 180 tablet 0   valsartan (DIOVAN) 80 MG tablet Take by mouth.     No current facility-administered medications for this visit.    ALLERGIES:  Allergies  Allergen Reactions   Bee Venom Swelling and Hives   Acetaminophen     itches  Amlodipine     Hair loss   Tyloxapol    Other Itching, Rash and Swelling   Oxycodone-Acetaminophen Rash    Pt states, "this gives her a rash,  but at home she takes oxycodone for pain relief" Pt states, "this gives her a rash, but at home she takes oxycodone for pain relief"    PHYSICAL EXAM:  Performance status (ECOG): 1 - Symptomatic but completely ambulatory  There were no vitals filed for this visit. Wt Readings from Last 3 Encounters:  04/05/21 127 lb 12.8 oz (58 kg)  03/30/21 121 lb (54.9 kg)  03/18/21 119 lb 1.9 oz (54 kg)   Physical Exam Vitals reviewed.  Constitutional:      Appearance: Normal appearance.  Cardiovascular:     Rate and Rhythm: Normal rate and regular rhythm.     Pulses: Normal pulses.     Heart sounds: Normal heart sounds.  Pulmonary:     Effort: Pulmonary effort is normal.     Breath sounds: Normal breath sounds.  Abdominal:     Palpations: Abdomen is soft. There is no hepatomegaly, splenomegaly or mass.     Tenderness: There is no abdominal tenderness.  Musculoskeletal:     Right lower leg: No edema.     Left lower leg: No edema.  Neurological:     General: No focal deficit present.     Mental Status: She is alert and oriented to person, place, and time.  Psychiatric:        Mood and Affect: Mood normal.        Behavior: Behavior normal.    LABORATORY DATA:  I have reviewed the labs as listed.  CBC Latest Ref Rng & Units 03/11/2021 02/25/2021 02/02/2021  WBC 4.0 - 10.5 K/uL 124.3(HH) 139.6(HH) 166.3(HH)  Hemoglobin 12.0 - 15.0 g/dL 9.0(L) 6.4(LL) 7.1(L)  Hematocrit 36.0 - 46.0 % 27.5(L) 19.5(L) 22.1(L)  Platelets 150 - 400 K/uL 123(L) 187 193   CMP Latest Ref Rng & Units 03/11/2021 02/25/2021 01/21/2021  Glucose 70 - 99 mg/dL 143(H) 114(H) 114(H)  BUN 8 - 23 mg/dL 30(H) 25(H) 25(H)  Creatinine 0.44 - 1.00 mg/dL 1.38(H) 1.32(H) 1.55(H)  Sodium 135 - 145 mmol/L 136 142 140  Potassium 3.5 - 5.1 mmol/L 3.1(L) 3.5 3.5  Chloride 98 - 111 mmol/L 108 110 105  CO2 22 - 32 mmol/L 21(L) 24 24  Calcium 8.9 - 10.3 mg/dL 8.1(L) 8.3(L) 8.7(L)  Total Protein 6.5 - 8.1 g/dL 7.1 6.7 8.6(H)  Total  Bilirubin 0.3 - 1.2 mg/dL 0.6 0.5 0.6  Alkaline Phos 38 - 126 U/L 159(H) 179(H) 173(H)  AST 15 - 41 U/L 44(H) 37 35  ALT 0 - 44 U/L _0 Component Value Date/Time   RBC 2.96 (L) 03/11/2021 0955   MCV 92.9 03/11/2021 0955   MCV 91 05/27/2020 1632   MCH 30.4 03/11/2021 0955   MCHC 32.7 03/11/2021 0955   RDW 21.6 (H) 03/11/2021 0955   RDW 14.1 05/27/2020 1632   LYMPHSABS 12.4 (H) 03/11/2021 0955   LYMPHSABS 1.7 05/27/2020 1632   MONOABS 14.9 (H) 03/11/2021 0955   EOSABS 0.0 03/11/2021 0955   EOSABS 0.2 05/27/2020 1632   BASOSABS 0.0 03/11/2021 0955   BASOSABS 0.0 05/27/2020 1632    DIAGNOSTIC IMAGING:  I have independently reviewed the scans and discussed with the patient. No results found.   ASSESSMENT:  Leukocytosis with left shift: - CBC on 11/06/2020 with white count 75.6, differential 59% neutrophils,  12% monocytes, 4% lymphocytes, 1 percentage of eosinophils and basophils, 6% band neutrophils, 12% myelocytes, 1% promyelocytes, 29% blasts. - Pathologist review of blood smear reported as leukoerythroblastic reaction. - 25 pound weight loss in the last 6 months, unintentional.  Decreased appetite.  Reports fatigue for the last few months.  Reports night sweats x3 in the last 1 month. - Bone marrow biopsy on 11/18/2020 consistent with granulocytic proliferation with differential including CMML versus myeloproliferative disorder. - BCR/ABL was negative. 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 - NGS myeloid panel shows mutations in ASXL1, MPL, TET2, EZH2 mutations. - Spleen ultrasound on 12/16/2020 shows mildly enlarged measuring 12.4 x 9.6 x 7 cm with volume of 435 cc. - PDGFR alpha, beta and FGFR 1 was negative. - She was evaluated by Dr. Florene Glen at [facility].  Slides were reviewed at [facility] hematopathology.  They thought it was less likely CMML and more likely MPL positive myeloproliferative neoplasm.  Dr. Florene Glen has recommended  initiate Jakafi. - Ruxolitinib 10 mg twice daily started on 02/16/2021.  2.  Social/family history: - She lives at home and is able to do all her ADLs and IADLs although she is getting tired lately.  She reports quitting smoking 6 months ago and smoked 1 pack/week for 43 years. - She believes that her mother had some kind of leukemia.  No other malignancies.   PLAN:  MPL positive myeloproliferative neoplasm: - She is tolerating ruxolitinib reasonably well. - She reports her night sweats have improved to 3/week.  Previously she used to have it every day.  She does not have any headaches. - She is eating better and gained about 10 pounds.  Denies any nausea vomiting.  She has constipation which turns into diarrhea if she takes stool softeners and MiraLAX aggressively. - She also reported pain over the spleen when she bends forward to tie her shoes.  She had this pain before but it has gotten worse in the last 3 weeks. - We have reviewed her labs today.  White count improved to 104.  Platelet count is 109.  Hemoglobin dropped to 6.7 from 9 previously. - I have recommended 1 unit PRBC. - Because of spleen pain, I have recommended CT scan of the abdomen with contrast. - She is emotionally distraught today because of issues with constipation/diarrhea. - We will see her back in 3 weeks with labs.  2.  Decreased appetite: - She has gained a total of 17 pounds since the start of Jakafi.  She reports that she is eating better.  3.  CKD: - Baseline creatinine between 1.3-1.5.  However creatinine today is 1.19. - Continue follow-up with Dr. Theador Hawthorne.  4.  Hyperuricemia: - Continue allopurinol 300 mg daily.  Uric acid today is 3.9.  5.  Hypokalemia: - Continue potassium 10 mEq daily.  Potassium today is 3.8.  Orders placed this encounter:  No orders of the defined types were placed in this encounter.    Derek Jack, MD [place] [phone]   I, Thana Ates, am  acting as a scribe for Dr. Derek Jack.  I, Derek Jack MD, have reviewed the above documentation for accuracy and completeness, and I agree with the above.

## 2021-04-05 NOTE — Progress Notes (Signed)
CRITICAL VALUE ALERT Critical value received:  WBC 104.0 , Hgb 6.7 Date of notification:  04-05-21 Time of notification: 5852 Critical value read back:  Yes.   Nurse who received alert:  C.Karel Turpen RN MD notified time and response:  7782, will give one unit blood per orders.

## 2021-04-05 NOTE — Progress Notes (Signed)
Patients port flushed without difficulty.  Good blood return noted with no bruising or swelling noted at site.  Band aid applied.  VSS with discharge and left in satisfactory condition with no s/s of distress noted.   

## 2021-04-05 NOTE — Assessment & Plan Note (Signed)
Inadequately controlled, no change in med management, refer to therapy

## 2021-04-05 NOTE — Assessment & Plan Note (Signed)
Controlled, no change in medication DASH diet and commitment to daily physical activity for a minimum of 30 minutes discussed and encouraged, as a part of hypertension management. The importance of attaining a healthy weight is also discussed.  BP/Weight 03/30/2021 03/18/2021 03/11/2021 02/26/2021 02/25/2021 02/09/2021 00/45/9977  Systolic BP 414 239 532 023 343 568 616  Diastolic BP 75 81 69 78 64 79 75  Wt. (Lbs) 121 119.12 117.4 - 119.4 114 115.52  BMI 23.63 23.26 19.54 - 20.49 19.57 19.25  Some encounter information is confidential and restricted. Go to Review Flowsheets activity to see all data.

## 2021-04-05 NOTE — Patient Instructions (Addendum)
[place] at [facility] Discharge Instructions  You were seen and examined today by [name]. He reviewed your most recent labs and everything looks okay except for your hemoglobin is low. We will set you up to have a blood transfusion.  Please keep follow up appointment as scheduled in    Thank you for choosing [place] at [facility] to provide your oncology and hematology care.  To afford each patient quality time with our provider, please arrive at least 15 minutes before your scheduled appointment time.   If you have a lab appointment with the [facility] please come in thru the Main Entrance and check in at the main information desk.  You need to re-schedule your appointment should you arrive 10 or more minutes late.  We strive to give you quality time with our providers, and arriving late affects you and other patients whose appointments are after yours.  Also, if you no show three or more times for appointments you may be dismissed from the clinic at the providers discretion.     Again, thank you for choosing [facility].  Our hope is that these requests will decrease the amount of time that you wait before being seen by our physicians.       _____________________________________________________________  Should you have questions after your visit to [facility], please contact our office at [phone] and follow the prompts.  Our office hours are 8:00 a.m. and 4:30 p.m. Monday - Friday.  Please note that voicemails left after 4:00 p.m. may not be returned until the following business day.  We are closed weekends and major holidays.  You do have access to a nurse 24-7, just call the main number to the clinic [phone] and do not press any options, hold on the line and a nurse will answer the phone.    For prescription refill requests, have your pharmacy contact our office and allow 72 hours.    Due to  Covid, you will need to wear a mask upon entering the hospital. If you do not have a mask, a mask will be given to you at the Main Entrance upon arrival. For doctor visits, patients may have 1 support person age 96 or older with them. For treatment visits, patients can not have anyone with them due to social distancing guidelines and our immunocompromised population.

## 2021-04-05 NOTE — Assessment & Plan Note (Addendum)
Refer to therapy, challenged with  recent diagnosis of leukocytosis , being treated by Oncology

## 2021-04-05 NOTE — Progress Notes (Signed)
° °  Renee Waters     MRN: [id number]      DOB: March 10, 1945   HPI Renee Waters is here for follow up and re-evaluation of chronic medical conditions, medication management and review of any available recent lab and radiology data.  Reports improvement in sleep and appetite on medication, however does state that she is often overwhelmed and feels depressed , because of er new health challenges. She does state that her family is very supportive, but that is npot enough. Has been in therapy in the past and is interested in this, feels she will benefit. States zyrtec beneficial in addressing itching, advised daily use  ROS Denies recent fever or chills. Denies chest pains, palpitations and leg swelling Denies abdominal pain, nausea, vomiting,diarrhea or constipation.   Denies dysuria, frequency, hesitancy or incontinence. Denies uncontrolled  joint pain, swelling and limitation in mobility. Denies headaches, seizures, numbness, or tingling.  Denies skin break down or rash.   PE  BP 129/75 (BP Location: Left Arm, Patient Position: Sitting, Cuff Size: Normal)    Pulse 63    Ht 5' (1.524 m)    Wt 121 lb (54.9 kg)    SpO2 95%    BMI 23.63 kg/m   Patient alert and oriented and in no cardiopulmonary distress.  HEENT: No facial asymmetry, EOMI,     Neck supple .  Chest: Clear to auscultation bilaterally.  CVS: S1, S2 no murmurs, no S3.Regular rate.  ABD: Soft non tender.   Ext: No edema  MS: decreased  ROM spine, shoulders, hips and knees.  Skin: Intact, no ulcerations or rash noted.  Psych: Good eye contact, anxious and at times tearful. Memory intact  anxious and mildly depressed appearing.  CNS: CN 2-12 intact, power,  normal throughout.no focal deficits noted.   Assessment & Plan  Essential hypertension Controlled, no change in medication DASH diet and commitment to daily physical activity for a minimum of 30 minutes discussed and encouraged, as a part of hypertension  management. The importance of attaining a healthy weight is also discussed.  BP/Weight 03/30/2021 03/18/2021 03/11/2021 02/26/2021 02/25/2021 02/09/2021 42/59/5638  Systolic BP 756 433 295 188 416 606 301  Diastolic BP 75 81 69 78 64 79 75  Wt. (Lbs) 121 119.12 117.4 - 119.4 114 115.52  BMI 23.63 23.26 19.54 - 20.49 19.57 19.25  Some encounter information is confidential and restricted. Go to Review Flowsheets activity to see all data.       Generalized pain Managed by pain mangement  MDD (major depressive disorder), recurrent episode, moderate ([place]) Refer to therapy, challenged with  recent diagnosis of leukocytosis , being treated by Oncology  Anxiety Inadequately controlled, no change in med management, refer to therapy  NECK PAIN, CHRONIC Reports uncontrolled pinat times, gabapentin prescribed for as needed use one daily  Leukocytosis Oncology treating, most recen tlabs show fall in WBC  Itching Good response to zyrtec, advised to use daily

## 2021-04-05 NOTE — Assessment & Plan Note (Signed)
Oncology treating, most recen tlabs show fall in WBC

## 2021-04-05 NOTE — Addendum Note (Signed)
Addended by: Donnie Aho on: 04/05/2021 03:17 PM   Modules accepted: Orders

## 2021-04-06 ENCOUNTER — Inpatient Hospital Stay (HOSPITAL_COMMUNITY): Payer: Medicare Other

## 2021-04-06 DIAGNOSIS — I10 Essential (primary) hypertension: Secondary | ICD-10-CM

## 2021-04-06 DIAGNOSIS — R160 Hepatomegaly, not elsewhere classified: Secondary | ICD-10-CM | POA: Diagnosis not present

## 2021-04-06 DIAGNOSIS — F418 Other specified anxiety disorders: Secondary | ICD-10-CM

## 2021-04-06 DIAGNOSIS — D72829 Elevated white blood cell count, unspecified: Secondary | ICD-10-CM

## 2021-04-06 DIAGNOSIS — Z79899 Other long term (current) drug therapy: Secondary | ICD-10-CM | POA: Diagnosis not present

## 2021-04-06 DIAGNOSIS — M159 Polyosteoarthritis, unspecified: Secondary | ICD-10-CM

## 2021-04-06 DIAGNOSIS — D649 Anemia, unspecified: Secondary | ICD-10-CM

## 2021-04-06 DIAGNOSIS — E790 Hyperuricemia without signs of inflammatory arthritis and tophaceous disease: Secondary | ICD-10-CM | POA: Diagnosis not present

## 2021-04-06 DIAGNOSIS — C946 Myelodysplastic disease, not classified: Secondary | ICD-10-CM | POA: Diagnosis not present

## 2021-04-06 DIAGNOSIS — N183 Chronic kidney disease, stage 3 unspecified: Secondary | ICD-10-CM | POA: Diagnosis not present

## 2021-04-06 DIAGNOSIS — R630 Anorexia: Secondary | ICD-10-CM | POA: Diagnosis not present

## 2021-04-06 MED ORDER — DIPHENHYDRAMINE HCL 25 MG PO CAPS
25.0000 mg | ORAL_CAPSULE | Freq: Once | ORAL | Status: AC
  Administered 2021-04-06: 25 mg via ORAL
  Filled 2021-04-06: qty 1

## 2021-04-06 MED ORDER — HEPARIN SOD (PORK) LOCK FLUSH 100 UNIT/ML IV SOLN
500.0000 [IU] | Freq: Every day | INTRAVENOUS | Status: AC | PRN
  Administered 2021-04-06: 500 [IU]

## 2021-04-06 MED ORDER — SODIUM CHLORIDE 0.9% IV SOLUTION
250.0000 mL | Freq: Once | INTRAVENOUS | Status: AC
  Administered 2021-04-06: 250 mL via INTRAVENOUS

## 2021-04-06 MED ORDER — ACETAMINOPHEN 325 MG PO TABS
650.0000 mg | ORAL_TABLET | Freq: Once | ORAL | Status: AC
  Administered 2021-04-06: 650 mg via ORAL
  Filled 2021-04-06: qty 2

## 2021-04-06 MED ORDER — SODIUM CHLORIDE 0.9% FLUSH
10.0000 mL | INTRAVENOUS | Status: AC | PRN
  Administered 2021-04-06: 10 mL

## 2021-04-06 NOTE — Progress Notes (Signed)
Pt presents today for 1 unit of provider's order. Vital signs stable and pt voiced no new complaints at this time.  1 unit of blood given today per MD orders. Tolerated infusion without adverse affects. Vital signs stable. No complaints at this time. Discharged from clinic ambulatory with cane in stable condition. Alert and oriented x 3. F/U with [facility] as scheduled.

## 2021-04-06 NOTE — Progress Notes (Signed)
UNMATCHED BLOOD PRODUCT NOTE  Compare the patient ID on the blood tag to the patient ID on the hospital armband and Blood Bank armband. Then confirm the unit number on the blood tag matches the unit number on the blood product.  If a discrepancy is discovered return the product to blood bank immediately.   Blood Product Type: Packed Red Blood Cells  Unit #: (Found on blood product bag, begins with W) [id number]  Product Code #: (Found on blood product bag, begins with E) [id number]   Start Time: 1405  Starting Rate: 120 ml/hr  Rate increase/decreased  (if applicable):  970    ml/hr  Rate changed time (if applicable): 2637   Stop Time: 8588   All Other Documentation should be documented within the Blood Admin Flowsheet per policy. Verified by B. Brown RN/ B. Valdemar Mcclenahan RN

## 2021-04-06 NOTE — Patient Instructions (Signed)
[facility]  Discharge Instructions: Thank you for choosing [facility] to provide your oncology and hematology care.  If you have a lab appointment with the [facility], please come in thru the Main Entrance and check in at the main information desk.  Wear comfortable clothing and clothing appropriate for easy access to any Portacath or PICC line.   We strive to give you quality time with your provider. You may need to reschedule your appointment if you arrive late (15 or more minutes).  Arriving late affects you and other patients whose appointments are after yours.  Also, if you miss three or more appointments without notifying the office, you may be dismissed from the clinic at the providers discretion.      For prescription refill requests, have your pharmacy contact our office and allow 72 hours for refills to be completed.    Today you received 1 unit of blood.   BELOW ARE SYMPTOMS THAT SHOULD BE REPORTED IMMEDIATELY: *FEVER GREATER THAN 100.4 F (38 C) OR HIGHER *CHILLS OR SWEATING *NAUSEA AND VOMITING THAT IS NOT CONTROLLED WITH YOUR NAUSEA MEDICATION *UNUSUAL SHORTNESS OF BREATH *UNUSUAL BRUISING OR BLEEDING *URINARY PROBLEMS (pain or burning when urinating, or frequent urination) *BOWEL PROBLEMS (unusual diarrhea, constipation, pain near the anus) TENDERNESS IN MOUTH AND THROAT WITH OR WITHOUT PRESENCE OF ULCERS (sore throat, sores in mouth, or a toothache) UNUSUAL RASH, SWELLING OR PAIN  UNUSUAL VAGINAL DISCHARGE OR ITCHING   Items with * indicate a potential emergency and should be followed up as soon as possible or go to the Emergency Department if any problems should occur.  Please show the CHEMOTHERAPY ALERT CARD or IMMUNOTHERAPY ALERT CARD at check-in to the Emergency Department and triage nurse.  Should you have questions after your visit or need to cancel or reschedule your appointment, please contact [facility] [phone]   and follow the prompts.  Office hours are 8:00 a.m. to 4:30 p.m. Monday - Friday. Please note that voicemails left after 4:00 p.m. may not be returned until the following business day.  We are closed weekends and major holidays. You have access to a nurse at all times for urgent questions. Please call the main number to the clinic [phone] and follow the prompts.  For any non-urgent questions, you may also contact your provider using MyChart. We now offer e-Visits for anyone 68 and older to request care online for non-urgent symptoms. For details visit [email or web address].   Also download the MyChart app! Go to the app store, search "MyChart", open the app, select [facility], and log in with your MyChart username and password.  Due to Covid, a mask is required upon entering the hospital/clinic. If you do not have a mask, one will be given to you upon arrival. For doctor visits, patients may have 1 support person aged 31 or older with them. For treatment visits, patients cannot have anyone with them due to current Covid guidelines and our immunocompromised population.

## 2021-04-07 ENCOUNTER — Ambulatory Visit (HOSPITAL_BASED_OUTPATIENT_CLINIC_OR_DEPARTMENT_OTHER)
Admission: RE | Admit: 2021-04-07 | Discharge: 2021-04-07 | Disposition: A | Discharge status: Home / Self Care | Payer: Medicare Other | Source: Ambulatory Visit | Attending: Hematology | Admitting: Hematology

## 2021-04-07 ENCOUNTER — Other Ambulatory Visit: Payer: Self-pay

## 2021-04-07 DIAGNOSIS — R1012 Left upper quadrant pain: Secondary | ICD-10-CM | POA: Insufficient documentation

## 2021-04-07 DIAGNOSIS — D72829 Elevated white blood cell count, unspecified: Secondary | ICD-10-CM | POA: Diagnosis not present

## 2021-04-07 DIAGNOSIS — R161 Splenomegaly, not elsewhere classified: Secondary | ICD-10-CM | POA: Diagnosis not present

## 2021-04-07 LAB — BPAM RBC
Blood Product Expiration Date: 202303232359
Unit Type and Rh: 5100

## 2021-04-07 MED ORDER — IOHEXOL 300 MG/ML  SOLN
100.0000 mL | Freq: Once | INTRAMUSCULAR | Status: AC | PRN
  Administered 2021-04-07: 60 mL via INTRAVENOUS

## 2021-04-07 MED ORDER — SODIUM CHLORIDE 0.9% FLUSH
10.0000 mL | INTRAVENOUS | Status: DC | PRN
  Filled 2021-04-07: qty 10

## 2021-04-07 MED ORDER — HEPARIN SOD (PORK) LOCK FLUSH 100 UNIT/ML IV SOLN
500.0000 [IU] | Freq: Once | INTRAVENOUS | Status: AC
Start: 2021-04-07 — End: 2021-04-07
  Administered 2021-04-07: 500 [IU] via INTRAVENOUS

## 2021-04-08 ENCOUNTER — Other Ambulatory Visit: Payer: Self-pay | Admitting: Family Medicine

## 2021-04-09 LAB — TYPE AND SCREEN
ABO/RH(D): O POS
Antibody Screen: POSITIVE
DAT, IgG: NEGATIVE
Donor AG Type: NEGATIVE
Donor AG Type: NEGATIVE
Unit division: 0
Unit division: 0

## 2021-04-09 LAB — BPAM RBC
Blood Product Expiration Date: 202303232359
ISSUE DATE / TIME: 202302281349
Unit Type and Rh: 5100

## 2021-04-12 ENCOUNTER — Inpatient Hospital Stay (HOSPITAL_COMMUNITY): Payer: Medicare Other | Attending: Hematology | Admitting: Hematology

## 2021-04-12 ENCOUNTER — Other Ambulatory Visit: Payer: Self-pay

## 2021-04-12 ENCOUNTER — Other Ambulatory Visit (HOSPITAL_COMMUNITY): Payer: Medicare Other

## 2021-04-12 VITALS — BP 123/73 | HR 82 | Temp 98.5°F | Resp 18 | Ht 64.57 in | Wt 123.9 lb

## 2021-04-12 DIAGNOSIS — M1990 Unspecified osteoarthritis, unspecified site: Secondary | ICD-10-CM | POA: Insufficient documentation

## 2021-04-12 DIAGNOSIS — R1012 Left upper quadrant pain: Secondary | ICD-10-CM | POA: Diagnosis not present

## 2021-04-12 DIAGNOSIS — K2270 Barrett's esophagus without dysplasia: Secondary | ICD-10-CM | POA: Insufficient documentation

## 2021-04-12 DIAGNOSIS — E790 Hyperuricemia without signs of inflammatory arthritis and tophaceous disease: Secondary | ICD-10-CM | POA: Insufficient documentation

## 2021-04-12 DIAGNOSIS — D72829 Elevated white blood cell count, unspecified: Secondary | ICD-10-CM

## 2021-04-12 DIAGNOSIS — E876 Hypokalemia: Secondary | ICD-10-CM | POA: Diagnosis not present

## 2021-04-12 DIAGNOSIS — F419 Anxiety disorder, unspecified: Secondary | ICD-10-CM | POA: Diagnosis not present

## 2021-04-12 DIAGNOSIS — I129 Hypertensive chronic kidney disease with stage 1 through stage 4 chronic kidney disease, or unspecified chronic kidney disease: Secondary | ICD-10-CM | POA: Diagnosis not present

## 2021-04-12 DIAGNOSIS — N183 Chronic kidney disease, stage 3 unspecified: Secondary | ICD-10-CM | POA: Diagnosis not present

## 2021-04-12 DIAGNOSIS — Z79899 Other long term (current) drug therapy: Secondary | ICD-10-CM | POA: Diagnosis not present

## 2021-04-12 DIAGNOSIS — F32A Depression, unspecified: Secondary | ICD-10-CM | POA: Diagnosis not present

## 2021-04-12 DIAGNOSIS — K219 Gastro-esophageal reflux disease without esophagitis: Secondary | ICD-10-CM | POA: Insufficient documentation

## 2021-04-12 DIAGNOSIS — C946 Myelodysplastic disease, not classified: Secondary | ICD-10-CM | POA: Diagnosis not present

## 2021-04-12 DIAGNOSIS — R519 Headache, unspecified: Secondary | ICD-10-CM | POA: Diagnosis not present

## 2021-04-12 DIAGNOSIS — R5383 Other fatigue: Secondary | ICD-10-CM | POA: Insufficient documentation

## 2021-04-12 DIAGNOSIS — G709 Myoneural disorder, unspecified: Secondary | ICD-10-CM | POA: Diagnosis not present

## 2021-04-12 DIAGNOSIS — M329 Systemic lupus erythematosus, unspecified: Secondary | ICD-10-CM | POA: Diagnosis not present

## 2021-04-12 DIAGNOSIS — R162 Hepatomegaly with splenomegaly, not elsewhere classified: Secondary | ICD-10-CM | POA: Insufficient documentation

## 2021-04-12 LAB — CBC WITH DIFFERENTIAL/PLATELET
Band Neutrophils: 14 %
Basophils Absolute: 0 10*3/uL (ref 0.0–0.1)
Basophils Relative: 0 %
Eosinophils Absolute: 0 10*3/uL (ref 0.0–0.5)
Eosinophils Relative: 0 %
HCT: 26.1 % — ABNORMAL LOW (ref 36.0–46.0)
Hemoglobin: 8.2 g/dL — ABNORMAL LOW (ref 12.0–15.0)
Lymphocytes Relative: 8 %
Lymphs Abs: 8 10*3/uL — ABNORMAL HIGH (ref 0.7–4.0)
MCH: 28.7 pg (ref 26.0–34.0)
MCHC: 31.4 g/dL (ref 30.0–36.0)
MCV: 91.3 fL (ref 80.0–100.0)
Metamyelocytes Relative: 5 %
Monocytes Absolute: 9 10*3/uL — ABNORMAL HIGH (ref 0.1–1.0)
Monocytes Relative: 9 %
Myelocytes: 5 %
Neutro Abs: 68.7 10*3/uL — ABNORMAL HIGH (ref 1.7–7.7)
Neutrophils Relative %: 55 %
Platelets: 171 10*3/uL (ref 150–400)
Promyelocytes Relative: 4 %
RBC: 2.86 MIL/uL — ABNORMAL LOW (ref 3.87–5.11)
RDW: 22.6 % — ABNORMAL HIGH (ref 11.5–15.5)
WBC: 99.5 10*3/uL (ref 4.0–10.5)
nRBC: 1.5 % — ABNORMAL HIGH (ref 0.0–0.2)

## 2021-04-12 LAB — SAMPLE TO BLOOD BANK

## 2021-04-12 MED ORDER — HEPARIN SOD (PORK) LOCK FLUSH 100 UNIT/ML IV SOLN
500.0000 [IU] | Freq: Once | INTRAVENOUS | Status: AC
  Administered 2021-04-12: 500 [IU] via INTRAVENOUS

## 2021-04-12 MED ORDER — SODIUM CHLORIDE 0.9% FLUSH
10.0000 mL | INTRAVENOUS | Status: DC | PRN
  Administered 2021-04-12: 10 mL via INTRAVENOUS

## 2021-04-12 NOTE — Progress Notes (Signed)
Patients port flushed without difficulty.  Good blood return noted with no bruising or swelling noted at site.  Band aid applied.  VSS with discharge and left in satisfactory condition with no s/s of distress noted.   

## 2021-04-12 NOTE — Progress Notes (Signed)
CRITICAL VALUE ALERT ?Critical value received:  WBC 99.5 ?Date of notification:  04/12/2021 ?Time of notification: 11:45 am  ?Critical value read back:  Yes.   ?Nurse who received alert:  B.Shakyla Nolley RN ?MD notified time and response:  [name] / Patient seen today by MD.  ?

## 2021-04-12 NOTE — Patient Instructions (Addendum)
Renee Waters at [facility] ?Discharge Instructions ? ? ?You were seen and examined today by [name]. ? ?He reviewed the results of your CT scan which were normal except for your spleen being enlarged. ? ?Return as scheduled for lab work and office visit.  ? ? ?Thank you for choosing [facility] at [facility] to provide your oncology and hematology care.  To afford each patient quality time with our provider, please arrive at least 15 minutes before your scheduled appointment time.  ? ?If you have a lab appointment with the [facility] please come in thru the Main Entrance and check in at the main information desk. ? ?You need to re-schedule your appointment should you arrive 10 or more minutes late.  We strive to give you quality time with our providers, and arriving late affects you and other patients whose appointments are after yours.  Also, if you no show three or more times for appointments you may be dismissed from the clinic at the providers discretion.     ?Again, thank you for choosing [facility].  Our hope is that these requests will decrease the amount of time that you wait before being seen by our physicians.       ?_____________________________________________________________ ? ?Should you have questions after your visit to [facility], please contact our office at [phone] and follow the prompts.  Our office hours are 8:00 a.m. and 4:30 p.m. Monday - Friday.  Please note that voicemails left after 4:00 p.m. may not be returned until the following business day.  We are closed weekends and major holidays.  You do have access to a nurse 24-7, just call the main number to the clinic [phone] and do not press any options, hold on the line and a nurse will answer the phone.   ? ?For prescription refill requests, have your pharmacy contact our office and allow 72 hours.   ? ?Due to Covid, you will need to wear a mask  upon entering the hospital. If you do not have a mask, a mask will be given to you at the Main Entrance upon arrival. For doctor visits, patients may have 1 support person age 35 or older with them. For treatment visits, patients can not have anyone with them due to social distancing guidelines and our immunocompromised population.  ? ?   ?

## 2021-04-12 NOTE — Progress Notes (Signed)
[address] [place], [place] [address]   CLINIC:  Medical Oncology/Hematology  PCP:  Renee Helper, MD [address] [place] / [place] [address]  [phone]  REASON FOR VISIT:  Follow-up for MPL positive myeloproliferative neoplasm  PRIOR THERAPY: none  CURRENT THERAPY: Jakafi 10 mg twice daily  INTERVAL HISTORY:  [name], a 76 y.o. female, returns for routine follow-up for her MPL positive myeloproliferative neoplasm. Renee Waters was last seen on 04/05/2021.  Today she reports feeling good. She reports continued left abdominal pain when bending over. She reports a fall this morning after tripping over a brick walkway; she denies any lightheadedness or dizziness. She reports constipation; her last BM was 2 days ago. Her appetite is good, and she denies headaches and night sweats.   REVIEW OF SYSTEMS:  Review of Systems  Constitutional:  Negative for appetite change and fatigue.  Gastrointestinal:  Positive for abdominal pain (L) and constipation.  Endocrine: Negative for hot flashes.  Musculoskeletal:  Positive for arthralgias (6/10 hips and knees) and back pain (6/10 lower).  Neurological:  Negative for dizziness, headaches and light-headedness.  Psychiatric/Behavioral:  Positive for depression. The patient is nervous/anxious.   All other systems reviewed and are negative.  PAST MEDICAL/SURGICAL HISTORY:  Past Medical History:  Diagnosis Date   Acute cholangitis    Allergy    Anemia    Anxiety    Arthritis    Phreesia 03/18/2020   Barrett's esophagus    Cataract    Chronic back pain    Chronic neck pain    CKD (chronic kidney disease) stage 3, GFR 30-59 ml/min ([place]) 10/29/2020   Depression    Genital herpes    GERD (gastroesophageal reflux disease)    H/O degenerative disc disease    History of blood transfusion    Hypertension    Insomnia    Lupus (systemic lupus erythematosus) ([place])    Neuromuscular disorder  ([place])    Osteoarthritis    S/P colonoscopy June 2005   normal, no polyps   S/P endoscopy June 2005, Oct 2009   2005: short-segment Barrett's, 2009: short-segment Barrett's   Upper respiratory tract infection 07/09/2020   UTI (lower urinary tract infection) 11/2012   Past Surgical History:  Procedure Laterality Date   ABDOMINAL HYSTERECTOMY     BACK SURGERY     BIOPSY N/A 03/20/2014   Procedure: BIOPSY;  Surgeon: Renee Dolin, MD;  Location: [facility] ORS;  Service: Endoscopy;  Laterality: N/A;   BIOPSY  09/14/2015   Procedure: BIOPSY;  Surgeon: Renee Dolin, MD;  Location: [facility] ENDO SUITE;  Service: Endoscopy;;  esophageal and gastric   BIOPSY  07/23/2019   Procedure: BIOPSY;  Surgeon: Renee Dolin, MD;  Location: [facility];  Service: Endoscopy;;  esophageal    CARPAL TUNNEL RELEASE Left 2013   cervical disectomy  2002   CESAREAN SECTION N/A    Phreesia 03/18/2020   CHOLECYSTECTOMY     with lysis of adhesions for sbo; "ruptured gallbladder".   COLONOSCOPY  11/09/2011   RMR: Melanosis coli   COLONOSCOPY WITH PROPOFOL N/A 09/14/2015   Dr. Gala Waters: diverticulosis, 48m TA removed. next TCS 09/2020.    COLONOSCOPY WITH PROPOFOL N/A 04/30/2020   Procedure: COLONOSCOPY WITH PROPOFOL;  Surgeon: RDaneil Dolin MD;  Location: [facility];  Service: Endoscopy;  Laterality: N/A;  PM (ASA 3)   DENTAL SURGERY  11/2015   multiple tooth extraction  ESOPHAGOGASTRODUODENOSCOPY  11/29/2007   salmon-colored  tongue   longest stable at  3 cm, distal esophagus as described previously status post biopsy/ Hiatal hernia, otherwise normal stomach D1 and D2   ESOPHAGOGASTRODUODENOSCOPY  01/06/11   short segment Barrett's esophagus s/p bx/Hiatal hernia   ESOPHAGOGASTRODUODENOSCOPY (EGD) WITH PROPOFOL N/A 03/20/2014   BSJ:GGEZMOQH distal esophagus short segment barrett's, bx with no dysplasia. next egd in 03/2017   ESOPHAGOGASTRODUODENOSCOPY (EGD) WITH PROPOFOL N/A 09/14/2015   Dr. Gala Waters: Barrett's without  dysplasia, gastritis benign bx, hiatal hernia. next EGD 09/2018.   ESOPHAGOGASTRODUODENOSCOPY (EGD) WITH PROPOFOL N/A 07/23/2019   Procedure: ESOPHAGOGASTRODUODENOSCOPY (EGD) WITH PROPOFOL;  Surgeon: Renee Dolin, MD;  [facility];  Service: Endoscopy;  Laterality: N/A;  3:00pm   EYE SURGERY N/A    Phreesia 03/18/2020   HERNIA REPAIR Right 07/2010   Dr. Zada Girt   IR IMAGING GUIDED PORT INSERTION  02/09/2021   JOINT REPLACEMENT     LAPAROSCOPIC CHOLECYSTECTOMY  2017   at [facility]   POLYPECTOMY  09/14/2015   Procedure: POLYPECTOMY;  Surgeon: Renee Dolin, MD;  Location: [facility];  Service: Endoscopy;;  ascending colon   right hip replacement  07/2010   went back in sept 2012 to fix   SHOULDER ARTHROSCOPY  2008   left   SPINE SURGERY N/A    Phreesia 03/18/2020   TOTAL HIP REVISION Right 12/17/2012   Procedure: RIGHT TOTAL HIP REVISION;  Surgeon: Mauri Pole, MD;  Location: [facility];  Service: Orthopedics;  Laterality: Right;   WRIST SURGERY Right 2011   open reduction right wrist.    SOCIAL HISTORY:  Social History   Socioeconomic History   Marital status: Married    Spouse name: Renee Waters   Number of children: 4   Years of education: 12+   Highest education level: Some college, no degree  Occupational History   Occupation: Press photographer  - retired   Occupation: non profit  Tobacco Use   Smoking status: Former    Packs/day: 0.25    Years: 25.00    Pack years: 6.25    Types: Cigarettes    Quit date: 02/07/2003    Years since quitting: 18.1   Smokeless tobacco: Never   Tobacco comments:    quit in 2004  Vaping Use   Vaping Use: Never used  Substance and Sexual Activity   Alcohol use: No   Drug use: No   Sexual activity: Not Currently    Birth control/protection: Surgical  Other Topics Concern   Not on file  Social History Narrative   Not on file   Social Determinants of Health   Financial Resource Strain: Low Risk    Difficulty of Paying Living  Expenses: Not hard at all  Food Insecurity: No Food Insecurity   Worried About Charity fundraiser in the Last Year: Never true   [place] in the Last Year: Never true  Transportation Needs: No Transportation Needs   Lack of Transportation (Medical): No   Lack of Transportation (Non-Medical): No  Physical Activity: Inactive   Days of Exercise per Week: 0 days   Minutes of Exercise per Session: 0 min  Stress: Stress Concern Present   Feeling of Stress : To some extent  Social Connections: Moderately Integrated   Frequency of Communication with Friends and Family: More than three times a week   Frequency of Social Gatherings with Friends and Family: Once a week   Attends Religious Services: More than  4 times per year   Active Member of Clubs or Organizations: No   Attends Archivist Meetings: Never   Marital Status: Married  Human resources officer Violence: Not At Risk   Fear of Current or Ex-Partner: No   Emotionally Abused: No   Physically Abused: No   Sexually Abused: No    FAMILY HISTORY:  Family History  Problem Relation Age of Onset   Hypertension Mother    Stroke Mother    Colon cancer Neg Hx    Anesthesia problems Neg Hx    Hypotension Neg Hx    Malignant hyperthermia Neg Hx    Pseudochol deficiency Neg Hx    Gastric cancer Neg Hx    Esophageal cancer Neg Hx     CURRENT MEDICATIONS:  Current Outpatient Medications  Medication Sig Dispense Refill   acyclovir (ZOVIRAX) 400 MG tablet Take 1 tablet (400 mg total) by mouth 2 (two) times daily. 60 tablet 3   allopurinol (ZYLOPRIM) 300 MG tablet Take 1 tablet by mouth once daily 30 tablet 0   ALPRAZolam (XANAX) 0.5 MG tablet Take 1 tablet (0.5 mg total) by mouth at bedtime. 30 tablet 5   cetirizine (ZYRTEC) 10 MG tablet Take 1 tablet (10 mg total) by mouth daily. 30 tablet 11   EPINEPHRINE 0.3 mg/0.3 mL IJ SOAJ injection INJECT 0.3 MLS INTO MUSCLE ONCE AS NEEDED 1 Device 2   gabapentin (NEURONTIN) 300 MG  capsule Take one capsule by mouth once daily, as needed, for pain 90 capsule 1   hydrochlorothiazide (HYDRODIURIL) 25 MG tablet Take 1 tablet by mouth once daily 90 tablet 0   lidocaine-prilocaine (EMLA) cream Apply 1 application topically as needed. 30 g 3   losartan (COZAAR) 25 MG tablet Take 1 tablet by mouth once daily for blood pressure 30 tablet 0   meloxicam (MOBIC) 7.5 MG tablet Take 1 tablet (7.5 mg total) by mouth daily as needed for pain (knee pain). 30 tablet 5   mirtazapine (REMERON) 15 MG tablet TAKE 1 TABLET BY MOUTH AT BEDTIME 30 tablet 0   Oxycodone HCl 10 MG TABS Take 1 tablet (10 mg total) by mouth 4 (four) times daily. 56 tablet 0   pantoprazole (PROTONIX) 40 MG tablet TAKE 1 TABLET BY MOUTH ONCE DAILY BEFORE BREAKFAST 90 tablet 3   potassium chloride (KLOR-CON) 10 MEQ tablet Take by mouth.     potassium chloride SA (KLOR-CON M) 10 MEQ tablet Take 1 tablet (10 mEq total) by mouth daily. 30 tablet 3   Probiotic Product (EQL DAILY PROBIOTIC PO) Take 1 capsule by mouth daily.     ruxolitinib phosphate (JAKAFI) 10 MG tablet Take 1 tablet (10 mg total) by mouth 2 (two) times daily. 60 tablet 0   temazepam (RESTORIL) 30 MG capsule Take 1 capsule (30 mg total) by mouth at bedtime as needed for sleep. 30 capsule 5   tiZANidine (ZANAFLEX) 4 MG tablet Take 8 mg by mouth 3 (three) times daily as needed (spasms). 180 tablet 0   valsartan (DIOVAN) 80 MG tablet Take by mouth.     No current facility-administered medications for this visit.    ALLERGIES:  Allergies  Allergen Reactions   Bee Venom Swelling and Hives   Acetaminophen     itches   Amlodipine     Hair loss   Tyloxapol    Other Itching, Rash and Swelling   Oxycodone-Acetaminophen Rash    Pt states, "this gives her a rash, but at home she takes  oxycodone for pain relief" Pt states, "this gives her a rash, but at home she takes oxycodone for pain relief"    PHYSICAL EXAM:  Performance status (ECOG): 1 - Symptomatic  but completely ambulatory  Vitals:   04/12/21 1007  BP: 123/73  Pulse: 82  Resp: 18  Temp: 98.5 F (36.9 C)  SpO2: 100%   Wt Readings from Last 3 Encounters:  04/12/21 123 lb 14.4 oz (56.2 kg)  04/05/21 127 lb 12.8 oz (58 kg)  03/30/21 121 lb (54.9 kg)   Physical Exam Vitals reviewed.  Constitutional:      Appearance: Normal appearance.  Cardiovascular:     Rate and Rhythm: Normal rate and regular rhythm.     Pulses: Normal pulses.     Heart sounds: Normal heart sounds.  Pulmonary:     Effort: Pulmonary effort is normal.     Breath sounds: Normal breath sounds.  Abdominal:     Palpations: Abdomen is soft. There is no hepatomegaly, splenomegaly or mass.     Tenderness: There is no abdominal tenderness.  Neurological:     General: No focal deficit present.     Mental Status: She is alert and oriented to person, place, and time.  Psychiatric:        Mood and Affect: Mood normal.        Behavior: Behavior normal.    LABORATORY DATA:  I have reviewed the labs as listed.  CBC Latest Ref Rng & Units 04/05/2021 03/11/2021 02/25/2021  WBC 4.0 - 10.5 K/uL 104.0(HH) 124.3(HH) 139.6(HH)  Hemoglobin 12.0 - 15.0 g/dL 6.7(LL) 9.0(L) 6.4(LL)  Hematocrit 36.0 - 46.0 % 21.1(L) 27.5(L) 19.5(L)  Platelets 150 - 400 K/uL 109(L) 123(L) 187   CMP Latest Ref Rng & Units 04/05/2021 03/11/2021 02/25/2021  Glucose 70 - 99 mg/dL 86 143(H) 114(H)  BUN 8 - 23 mg/dL 29(H) 30(H) 25(H)  Creatinine 0.44 - 1.00 mg/dL 1.19(H) 1.38(H) 1.32(H)  Sodium 135 - 145 mmol/L 137 136 142  Potassium 3.5 - 5.1 mmol/L 3.8 3.1(L) 3.5  Chloride 98 - 111 mmol/L 109 108 110  CO2 22 - 32 mmol/L 23 21(L) 24  Calcium 8.9 - 10.3 mg/dL 8.2(L) 8.1(L) 8.3(L)  Total Protein 6.5 - 8.1 g/dL 7.3 7.1 6.7  Total Bilirubin 0.3 - 1.2 mg/dL 0.7 0.6 0.5  Alkaline Phos 38 - 126 U/L 113 159(H) 179(H)  AST 15 - 41 U/L 35 44(H) 37  ALT 0 - 44 U/L 19 18 15       Component Value Date/Time   RBC 2.29 (L) 04/05/2021 1336   MCV 92.1  04/05/2021 1336   MCV 91 05/27/2020 1632   MCH 29.3 04/05/2021 1336   MCHC 31.8 04/05/2021 1336   RDW 23.9 (H) 04/05/2021 1336   RDW 14.1 05/27/2020 1632   LYMPHSABS 4.2 (H) 04/05/2021 1336   LYMPHSABS 1.7 05/27/2020 1632   MONOABS 8.3 (H) 04/05/2021 1336   EOSABS 0.0 04/05/2021 1336   EOSABS 0.2 05/27/2020 1632   BASOSABS 0.0 04/05/2021 1336   BASOSABS 0.0 05/27/2020 1632    DIAGNOSTIC IMAGING:  I have independently reviewed the scans and discussed with the patient. CT Abdomen W Contrast  Result Date: 04/08/2021 CLINICAL DATA:  Splenomegaly, left upper quadrant pain EXAM: CT ABDOMEN WITH CONTRAST TECHNIQUE: Multidetector CT imaging of the abdomen was performed using the standard protocol following bolus administration of intravenous contrast. RADIATION DOSE REDUCTION: This exam was performed according to the departmental dose-optimization program which includes automated exposure control, adjustment of the mA  and/or kV according to patient size and/or use of iterative reconstruction technique. CONTRAST:  2m OMNIPAQUE IOHEXOL 300 MG/ML SOLN, additional oral enteric contrast COMPARISON:  06/23/2017 FINDINGS: Lower chest: No acute abnormality. Hepatobiliary: No focal liver abnormality is seen. Status post cholecystectomy. No biliary dilatation. Pancreas: Unremarkable. No pancreatic ductal dilatation or surrounding inflammatory changes. Spleen: Mild splenomegaly, maximum coronal span 14.3 cm. Adrenals/Urinary Tract: Adrenal glands are unremarkable. Kidneys are normal, without renal calculi, focal lesion, or hydronephrosis. Bladder is unremarkable. Stomach/Bowel: Stomach is within normal limits. No evidence of bowel wall thickening, distention, or inflammatory changes. Vascular/Lymphatic: Aortic atherosclerosis. No enlarged abdominal lymph nodes. Other: No abdominal wall hernia or abnormality. No abdominopelvic ascites. Musculoskeletal: No acute or significant osseous findings. IMPRESSION: 1. Mild  splenomegaly, maximum coronal span 14.3 cm. 2. Status post cholecystectomy. Aortic Atherosclerosis (ICD10-I70.0). Electronically Signed   By: ADelanna AhmadiM.D.   On: 04/08/2021 10:39     ASSESSMENT:  Leukocytosis with left shift: - CBC on 11/06/2020 with white count 75.6, differential 59% neutrophils, 12% monocytes, 4% lymphocytes, 1 percentage of eosinophils and basophils, 6% band neutrophils, 12% myelocytes, 1% promyelocytes, 29% blasts. - Pathologist review of blood smear reported as leukoerythroblastic reaction. - 25 pound weight loss in the last 6 months, unintentional.  Decreased appetite.  Reports fatigue for the last few months.  Reports night sweats x3 in the last 1 month. - Bone marrow biopsy on 11/18/2020 consistent with granulocytic proliferation with differential including CMML versus myeloproliferative disorder. - BCR/ABL was negative. 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 - NGS myeloid panel shows mutations in ASXL1, MPL, TET2, EZH2 mutations. - Spleen ultrasound on 12/16/2020 shows mildly enlarged measuring 12.4 x 9.6 x 7 cm with volume of 435 cc. - PDGFR alpha, beta and FGFR 1 was negative. - [facility].  They thought it was less likely CMML and more likely MPL positive myeloproliferative neoplasm.  Dr. PFlorene Glenhas recommended initiate Jakafi. - Ruxolitinib 10 mg twice daily started on 02/16/2021.  2.  Social/family history: - She lives at home and is able to do all her ADLs and IADLs although she is getting tired lately.  She reports quitting smoking 6 months ago and smoked 1 pack/week for 43 years. - She believes that her mother had some kind of leukemia.  No other malignancies.   PLAN:  MPL positive myeloproliferative neoplasm: - She is taking ruxolitinib without any major problems. - As she complained of pain in the left upper quadrant area when she  bends forward and on certain movements, I have recommended CT scan. - We reviewed images of the CT [facility] from 04/07/2021.  Hepatomegaly and splenomegaly measuring 14.3 cm.  No other pathology was seen.  No splenic infarcts. - She reportedly tripped and fell this morning.  She has some chest pain from the fall. - She has some constipation from ruxolitinib.  Continue MiraLAX. - Reviewed labs from today which shows white count is 99.5, down from 104.  Hemoglobin is 8.2, status post blood transfusion on 04/05/2021.  Platelet count is 171 and normal. - Continue ruxolitinib at the same dose.  RTC 3 weeks with repeat labs.  2.  Decreased appetite: - She has gained a total of 20 pounds since the start of Jakafi.  She reports eating better.  She gained 3 pounds since last week.  3.  CKD: - Baseline creatinine between 1.3-1.5. - Continue  follow-up with Dr. Theador Hawthorne.  4.  Hyperuricemia: - Continue allopurinol 300 mg daily.  Uric acid is normal.  5.  Hypokalemia: - Continue potassium 10 mEq daily.  Potassium is normal.  Orders placed this encounter:  No orders of the defined types were placed in this encounter.    Derek Jack, MD [place] [phone]   I, Thana Ates, am acting as a scribe for Dr. Derek Jack.  I, Derek Jack MD, have reviewed the above documentation for accuracy and completeness, and I agree with the above.

## 2021-04-20 ENCOUNTER — Encounter (HOSPITAL_COMMUNITY): Payer: Self-pay

## 2021-04-20 NOTE — Progress Notes (Signed)
Patient called reporting incontinence and increased genital viral outbreaks, encouraged patient to call the MD that manages viral outbreaks for further instructions. ?

## 2021-04-21 ENCOUNTER — Ambulatory Visit (HOSPITAL_COMMUNITY): Payer: Medicare Other

## 2021-04-21 ENCOUNTER — Telehealth: Payer: Self-pay | Admitting: *Deleted

## 2021-04-21 ENCOUNTER — Ambulatory Visit (INDEPENDENT_AMBULATORY_CARE_PROVIDER_SITE_OTHER): Payer: Medicare Other | Admitting: Family Medicine

## 2021-04-21 ENCOUNTER — Other Ambulatory Visit: Payer: Self-pay

## 2021-04-21 ENCOUNTER — Encounter: Payer: Self-pay | Admitting: Family Medicine

## 2021-04-21 VITALS — BP 100/60 | HR 84 | Resp 18 | Ht 60.0 in | Wt 127.1 lb

## 2021-04-21 DIAGNOSIS — R52 Pain, unspecified: Secondary | ICD-10-CM | POA: Diagnosis not present

## 2021-04-21 DIAGNOSIS — B009 Herpesviral infection, unspecified: Secondary | ICD-10-CM | POA: Diagnosis not present

## 2021-04-21 DIAGNOSIS — F5105 Insomnia due to other mental disorder: Secondary | ICD-10-CM

## 2021-04-21 DIAGNOSIS — F418 Other specified anxiety disorders: Secondary | ICD-10-CM

## 2021-04-21 DIAGNOSIS — I10 Essential (primary) hypertension: Secondary | ICD-10-CM

## 2021-04-21 DIAGNOSIS — F331 Major depressive disorder, recurrent, moderate: Secondary | ICD-10-CM

## 2021-04-21 DIAGNOSIS — F322 Major depressive disorder, single episode, severe without psychotic features: Secondary | ICD-10-CM

## 2021-04-21 DIAGNOSIS — D471 Chronic myeloproliferative disease: Secondary | ICD-10-CM

## 2021-04-21 MED ORDER — ACYCLOVIR 800 MG PO TABS: 800.0000 mg | ORAL_TABLET | Freq: Every day | ORAL | 1 refills | Status: DC

## 2021-04-21 MED ORDER — ACYCLOVIR 5 % EX OINT
1.0000 | TOPICAL_OINTMENT | CUTANEOUS | 1 refills | Status: DC
Start: 2021-04-21 — End: 2022-02-10

## 2021-04-21 MED ORDER — CITALOPRAM HYDROBROMIDE 20 MG PO TABS: 20.0000 mg | ORAL_TABLET | Freq: Every day | ORAL | 3 refills | Status: DC

## 2021-04-21 NOTE — Patient Instructions (Signed)
F/U in 5 weeks, call if you need me sooner ? ?Ypou are referred urgently to Therapist, we should have an appointment for you in the next 1 week ? ?New for depression celexa 20 mg one daily, you have taken this in the past ? ?For blister, acyclovir tablets 5 times daily and also ointment is prescribed. The tablets only will clear it up ? ?Thanks for choosing [facility], we consider it a privelige to serve you. ? ?

## 2021-04-21 NOTE — Chronic Care Management (AMB) (Signed)
?  Care Management  ? ?Note ? ?04/21/2021 ?Name: Renee Waters MRN: [id number] DOB: [birth year] ? ?Renee Waters is a 76 y.o. year old female who is a primary care patient of [name], MD and is actively engaged with the care management team. I reached out to [name] by phone today to assist with scheduling a follow up visit with the Licensed Clinical Social Worker ? ?Follow up plan: ?Telephone appointment with care management team member scheduled for:04/23/21 ? ?Laverda Sorenson  ?Care Guide, Embedded Care Coordination ?[facility]  Care Management  ?Direct Dial: [phone]  ?

## 2021-04-21 NOTE — Chronic Care Management (AMB) (Signed)
?  Care Management  ? ?Note ? ?04/21/2021 ?Name: Renee Waters MRN: [id number] DOB: 12-Feb-1945 ? ?Renee Waters is a 76 y.o. year old female who is a primary care patient of [name], MD and is actively engaged with the care management team. I reached out to [name] by phone today to assist with scheduling a follow up visit with the Licensed Clinical Social Worker ? ?Follow up plan: ?Unsuccessful telephone outreach attempt made. A HIPAA compliant phone message was left for the patient providing contact information and requesting a return call.  ?The care management team will reach out to the patient again over the next 7 days.  ?If patient returns call to provider office, please advise to call [facility] at [phone] ?SIGNATURE ?

## 2021-04-22 DIAGNOSIS — M47812 Spondylosis without myelopathy or radiculopathy, cervical region: Secondary | ICD-10-CM | POA: Diagnosis not present

## 2021-04-22 DIAGNOSIS — M47816 Spondylosis without myelopathy or radiculopathy, lumbar region: Secondary | ICD-10-CM | POA: Diagnosis not present

## 2021-04-22 DIAGNOSIS — G894 Chronic pain syndrome: Secondary | ICD-10-CM | POA: Diagnosis not present

## 2021-04-22 DIAGNOSIS — Z79891 Long term (current) use of opiate analgesic: Secondary | ICD-10-CM | POA: Diagnosis not present

## 2021-04-23 ENCOUNTER — Ambulatory Visit (INDEPENDENT_AMBULATORY_CARE_PROVIDER_SITE_OTHER): Payer: Medicare Other | Admitting: Licensed Clinical Social Worker

## 2021-04-23 DIAGNOSIS — M542 Cervicalgia: Secondary | ICD-10-CM

## 2021-04-23 DIAGNOSIS — D649 Anemia, unspecified: Secondary | ICD-10-CM

## 2021-04-23 DIAGNOSIS — M159 Polyosteoarthritis, unspecified: Secondary | ICD-10-CM

## 2021-04-23 DIAGNOSIS — M150 Primary generalized (osteo)arthritis: Secondary | ICD-10-CM

## 2021-04-23 DIAGNOSIS — I10 Essential (primary) hypertension: Secondary | ICD-10-CM

## 2021-04-23 DIAGNOSIS — F418 Other specified anxiety disorders: Secondary | ICD-10-CM

## 2021-04-23 NOTE — Patient Instructions (Addendum)
Visit Information ? ?Patient goals:  Begin and Stick with Counseling for Depression ? ?Timeframe:  Long-Range Goal ?Priority:  High ?Progress:  On Track ?Start Date:      04/02/21                      ?Expected End Date:  07/22/21    ?  ?Follow Up Date  05/21/21 at 2:00 PM  ? ?- Begin and Stick with Counseling for Depression ?  ?Why is this important?   ?Beating depression may take some time.  ?If you don't feel better right away, don't give up on your treatment plan.   ? ?Patient Self Care Activities:  ?Self administers medications as prescribed ?Attends church or other social activities ?Performs ADL's independently ?Calls provider office for new concerns or questions ?Ability for insight ?Independent living ?Motivation for treatment ?Strong family or social support ? ?Patient Coping Strengths:  ?Supportive Relationships ?Family ?Friends ?Hopefulness ?Self Advocate ?Able to Communicate Effectively ? ?Patient Self Care Deficits:  ?Depression issues ?Walking challenges  ? ?Patient Goals:  ?Allow time for self care, such as taking a nap, eating meals on schedule, spending time with a hobby or involved in phone calls with family members ?Attend scheduled medical appointments in next 30 days ? ?Follow Up Plan: LCSW to call client on 05/21/21 at 2:00 PM  ? ?Norva Riffle.Enza Shone MSW, LCSW ?Licensed Clinical Social Worker ?[facility] ?[phone] ?

## 2021-04-23 NOTE — Chronic Care Management (AMB) (Signed)
?Chronic Care Management  ? ? Clinical Social Work Note ? ?04/23/2021 ?Name: Renee Waters MRN: [id number] DOB: January 17, 1946 ? ?Renee Waters is a 76 y.o. year old female who is a primary care patient of Moshe Cipro Norwood Levo, MD. The CCM team was consulted to assist the patient with chronic disease management and/or care coordination needs related to: [employer] .  ? ?Engaged with patient by telephone for follow up visit in response to provider referral for social work chronic care management and care coordination services.  ? ?Consent to Services:  ?The patient was given information about Chronic Care Management services, agreed to services, and gave verbal consent prior to initiation of services.  Please see initial visit note for detailed documentation.  ? ?Patient agreed to services and consent obtained.  ? ?Assessment: Review of patient past medical history, allergies, medications, and health status, including review of relevant consultants reports was performed today as part of a comprehensive evaluation and provision of chronic care management and care coordination services.    ? ?SDOH (Social Determinants of Health) assessments and interventions performed:  ?SDOH Interventions   ? ?Flowsheet Row Most Recent Value  ?SDOH Interventions   ?Physical Activity Interventions Other (Comments)  [uses a cane or a walker to help her walk]  ?Stress Interventions Provide Counseling  [has stress related to managing medical needs. she has insomnia]  ?Depression Interventions/Treatment  Counseling, Medication  ? ?  ?  ? ?Advanced Directives Status: See Vynca application for related entries. ? ?CCM Care Plan ? ?Allergies  ?Allergen Reactions  ? Bee Venom Swelling and Hives  ? Acetaminophen   ?  itches  ? Amlodipine   ?  Hair loss  ? Tyloxapol   ? Other Itching, Rash and Swelling  ? Oxycodone-Acetaminophen Rash  ?  Pt states, "this gives her a rash, but at home she takes oxycodone for pain relief" ?Pt states, "this  gives her a rash, but at home she takes oxycodone for pain relief"  ? ? ?Outpatient Encounter Medications as of 04/23/2021  ?Medication Sig Note  ? acyclovir (ZOVIRAX) 800 MG tablet Take 1 tablet (800 mg total) by mouth 5 (five) times daily.   ? acyclovir ointment (ZOVIRAX) 5 % Apply 1 application. topically every 3 (three) hours.   ? allopurinol (ZYLOPRIM) 300 MG tablet Take 1 tablet by mouth once daily   ? ALPRAZolam (XANAX) 0.5 MG tablet Take 1 tablet (0.5 mg total) by mouth at bedtime.   ? cetirizine (ZYRTEC) 10 MG tablet Take 1 tablet (10 mg total) by mouth daily.   ? citalopram (CELEXA) 20 MG tablet Take 1 tablet (20 mg total) by mouth daily.   ? EPINEPHRINE 0.3 mg/0.3 mL IJ SOAJ injection INJECT 0.3 MLS INTO MUSCLE ONCE AS NEEDED   ? gabapentin (NEURONTIN) 300 MG capsule Take one capsule by mouth once daily, as needed, for pain   ? hydrochlorothiazide (HYDRODIURIL) 25 MG tablet Take 1 tablet by mouth once daily   ? lidocaine-prilocaine (EMLA) cream Apply 1 application topically as needed. 02/09/2021: Not started yet  ? losartan (COZAAR) 25 MG tablet Take 1 tablet by mouth once daily for blood pressure   ? meloxicam (MOBIC) 7.5 MG tablet Take 1 tablet (7.5 mg total) by mouth daily as needed for pain (knee pain).   ? mirtazapine (REMERON) 15 MG tablet TAKE 1 TABLET BY MOUTH AT BEDTIME   ? Oxycodone HCl 10 MG TABS Take 1 tablet (10 mg total) by mouth 4 (four) times daily.   ?  pantoprazole (PROTONIX) 40 MG tablet TAKE 1 TABLET BY MOUTH ONCE DAILY BEFORE BREAKFAST   ? potassium chloride SA (KLOR-CON M) 10 MEQ tablet Take 1 tablet (10 mEq total) by mouth daily.   ? Probiotic Product (EQL DAILY PROBIOTIC PO) Take 1 capsule by mouth daily.   ? ruxolitinib phosphate (JAKAFI) 10 MG tablet Take 1 tablet (10 mg total) by mouth 2 (two) times daily.   ? temazepam (RESTORIL) 30 MG capsule Take 1 capsule (30 mg total) by mouth at bedtime as needed for sleep.   ? tiZANidine (ZANAFLEX) 4 MG tablet Take 8 mg by mouth 3 (three)  times daily as needed (spasms).   ? ?No facility-administered encounter medications on file as of 04/23/2021.  ? ? ?Patient Active Problem List  ? Diagnosis Date Noted  ? MPN (myeloproliferative neoplasm) ([place]) 04/21/2021  ? Anxiety 04/05/2021  ? Leukocytosis 11/09/2020  ? CKD (chronic kidney disease) stage 3, GFR 30-59 ml/min ([place]) 10/29/2020  ? Dermatomycosis 08/02/2020  ? HSV-2 (herpes simplex virus 2) infection 08/02/2020  ? Rib pain on left side 03/21/2020  ? Itching 03/21/2020  ? Mildly underweight adult 09/19/2019  ? MDD (major depressive disorder), recurrent, in full remission ([place]) 03/27/2019  ? Educated about COVID-19 virus infection 06/24/2018  ? MDD (major depressive disorder), recurrent episode, moderate ([place]) 10/28/2016  ? Thoracic spine pain 08/31/2016  ? Anemia, normocytic normochromic 05/27/2015  ? IDA (iron deficiency anemia) 03/02/2015  ? Unsteady gait 02/25/2015  ? Mucosal abnormality of esophagus   ? Systemic lupus ([place]) 10/31/2012  ? Vitamin D deficiency 03/16/2012  ? Generalized pain 03/15/2012  ? Constipation 10/06/2011  ? Hip pain 06/29/2011  ? Barrett's esophagus 12/13/2010  ? Western blot positive HSV2 06/22/2007  ? Depression 06/22/2007  ? Essential hypertension 06/22/2007  ? GERD (gastroesophageal reflux disease) 06/22/2007  ? DJD (degenerative joint disease) 06/22/2007  ? NECK PAIN, CHRONIC 06/22/2007  ? Chronic midline low back pain with sciatica 06/22/2007  ? Insomnia secondary to depression with anxiety 06/22/2007  ? Viral infection, unspecified 06/22/2007  ? ? ?Conditions to be addressed/monitored: monitor client management of depression issues ? ?Care Plan : LCSW Plan of Care  ?Updates made by Katha Cabal, LCSW since 04/23/2021 12:00 AM  ?  ? ?Problem: Symptoms (Depression)   ?  ? ?Long-Range Goal: Symptoms Monitored and Managed. Manage Depression issues   ?Start Date: 04/23/2021  ?Expected End Date: 07/22/2021  ?This Visit's Progress: On track  ?Recent Progress: On track   ?Priority: High  ?Note:   ?Current Barriers:  ?Chronic Mental Health needs related to depression, anxiety ?Mental Health Concerns  ?Walking challenges ?Pain issues ?Suicidal Ideation/Homicidal Ideation: No ? ?Clinical Social Work Goal(s):  ?patient will work with SW  as needed  by telephone  to reduce or manage symptoms related to depression ?demonstrate a reduction in symptoms related to :Anxiety and Depression  ?Patient to communicate as needed with RNCM in next 30 days to  discuss nursing needs of client ? ?Interventions: ? ?Discussed client needs with [name] ?Discussed appetite of client and sleeping issues of client. Client said she has difficulty sleeping ?Discussed mood of client. She said she is trying to stay in positive mood. She said she is taking two prescribed medications for leukemia. She said she sees oncologist monthly to manage leukemia needs. She said she feels that her mood is stable. She is just getting fatigued with all the appointments and treatments needed Her spouse helps with driving her to and from appointments ?  Discussed energy level of client ?Discussed ambulation of client. She uses a cane or walker to help her walk ?Used Active Listening techniques to allow client to express her feelings and concerns. ?Provided counseling support for Fraya ?Encouraged client to call RNCM as needed to discuss nursing needs of client ?Reviewed family support. She has support from her spouse and from her adult children ?Discussed her support through [facility] ?Encouraged client to call LCSW as needed to discuss SW needs of client ?1:1 collaboration with [name], MD regarding development and update of comprehensive plan of care as evidenced by provider attestation and co-signature ? ?Patient Self Care Activities:  ?Self administers medications as prescribed ?Attends church or other social activities ?Performs ADL's independently ?Calls provider office for new concerns  or questions ?Ability for insight ?Independent living ?Motivation for treatment ?Strong family or social support ? ?Patient Coping Strengths:  ?Supportive Relationships ?Family ?Friends ?Hopefulness ?Self Advocat

## 2021-04-24 ENCOUNTER — Other Ambulatory Visit: Payer: Self-pay | Admitting: Family Medicine

## 2021-04-25 ENCOUNTER — Encounter: Payer: Self-pay | Admitting: Family Medicine

## 2021-04-25 NOTE — Assessment & Plan Note (Signed)
managed by pain clinic ?

## 2021-04-25 NOTE — Progress Notes (Signed)
? ?  Renee Waters     MRN: [id number]      DOB: [birth year] ? ? ?HPI ?Renee Waters is here for follow up and re-evaluation of chronic medical conditions, medication management and review of any available recent lab and radiology data.  ?Preventive health is updated, specifically  Cancer screening and Immunization.   ?Questions or concerns regarding consultations or procedures which the PT has had in the interim are  addressed. ?The PT denies any adverse reactions to current medications since the last visit.  ?Blister on right lip of vagina, not responding to medication, no drainage or fever ?Uncontrolled depression and anxiety , overwhelmed , feels as though she is "left alone" and no one is telling her anything, getting on spouse's nerves ? ?ROS ?Denies recent fever or chills. ?Denies sinus pressure, nasal congestion, ear pain or sore throat. ?Denies chest congestion, productive cough or wheezing. ?Denies chest pains, palpitations and leg swelling ?Denies abdominal pain, nausea, vomiting,diarrhea or constipation.   ?Denies dysuria, frequency, hesitancy or incontinence. ?Denies uncontrolled  joint pain, swelling and limitation in mobility. ?Denies headaches, seizures, numbness, or tingling. ? ?PE ? ?BP 100/60 (BP Location: Right Arm, Patient Position: Sitting, Cuff Size: Normal)   Pulse 84   Resp 18   Ht 5' (1.524 m)   Wt 127 lb 1.9 oz (57.7 kg)   SpO2 92%   BMI 24.83 kg/m?  ? ?Patient alert and oriented and in no cardiopulmonary distress.Tearful, anxious , depressed ? ?HEENT: No facial asymmetry, EOMI,     Neck supple . ? ?Chest: Clear to auscultation bilaterally. ? ?CVS: S1, S2 no murmurs, no S3.Regular rate. ? ?ABD: Soft non tender.  ?GU: open blister on right labia, no purulent drainage, no inguinal adenopathy ?Ext: No edema ? ?MS: decreased  ROM spine, shoulders, hips and knees. ? ?Skin: Intact, no ulcerations or rash noted. ? ?Psych: Good eye contact, tearful anxious and depressed appearing. ? ?CNS: CN 2-12  intact, power,  normal throughout.no focal deficits noted. ? ? ?Assessment & Plan ? ?MDD (major depressive disorder), recurrent episode, moderate ([place]) ?Start celexa daily, refer urgently to Therapy, not suicidal or homicidal ? ?HSV-2 (herpes simplex virus 2) infection ?Current outbreak , increase acyclovir dose and add topical ? ?Essential hypertension ?Consider reducing med dose at next visit, may be over corrected ? ?Generalized pain ?managed by pain clinic ? ?MPN (myeloproliferative neoplasm) ([place]) ?reviewed recent Oncologyn ote and labs with pt and spouse, currently in treatment ? ?Insomnia secondary to depression with anxiety ?Controlled, no change in medication ?Sleep hygiene reviewed and written information offered also. ?Prescription sent for  medication needed. ? ? ?

## 2021-04-25 NOTE — Assessment & Plan Note (Signed)
Current outbreak , increase acyclovir dose and add topical ?

## 2021-04-25 NOTE — Assessment & Plan Note (Signed)
Controlled, no change in medication Sleep hygiene reviewed and written information offered also. Prescription sent for  medication needed.  

## 2021-04-25 NOTE — Assessment & Plan Note (Signed)
Consider reducing med dose at next visit, may be over corrected ?

## 2021-04-25 NOTE — Assessment & Plan Note (Signed)
Start celexa daily, refer urgently to Therapy, not suicidal or homicidal ?

## 2021-04-25 NOTE — Assessment & Plan Note (Signed)
reviewed recent Oncologyn ote and labs with pt and spouse, currently in treatment ?

## 2021-04-26 ENCOUNTER — Other Ambulatory Visit (HOSPITAL_COMMUNITY): Payer: Self-pay | Admitting: Hematology

## 2021-05-04 ENCOUNTER — Other Ambulatory Visit: Payer: Self-pay

## 2021-05-04 ENCOUNTER — Inpatient Hospital Stay (HOSPITAL_COMMUNITY): Payer: Medicare Other

## 2021-05-04 ENCOUNTER — Encounter (HOSPITAL_COMMUNITY): Payer: Self-pay

## 2021-05-04 ENCOUNTER — Inpatient Hospital Stay (HOSPITAL_BASED_OUTPATIENT_CLINIC_OR_DEPARTMENT_OTHER): Payer: Medicare Other | Admitting: Hematology

## 2021-05-04 VITALS — BP 123/65 | HR 75 | Temp 98.1°F | Resp 18 | Ht 65.0 in | Wt 124.8 lb

## 2021-05-04 DIAGNOSIS — E876 Hypokalemia: Secondary | ICD-10-CM | POA: Diagnosis not present

## 2021-05-04 DIAGNOSIS — Z79899 Other long term (current) drug therapy: Secondary | ICD-10-CM | POA: Diagnosis not present

## 2021-05-04 DIAGNOSIS — N183 Chronic kidney disease, stage 3 unspecified: Secondary | ICD-10-CM | POA: Diagnosis not present

## 2021-05-04 DIAGNOSIS — C946 Myelodysplastic disease, not classified: Secondary | ICD-10-CM | POA: Diagnosis not present

## 2021-05-04 DIAGNOSIS — R162 Hepatomegaly with splenomegaly, not elsewhere classified: Secondary | ICD-10-CM | POA: Diagnosis not present

## 2021-05-04 DIAGNOSIS — D72829 Elevated white blood cell count, unspecified: Secondary | ICD-10-CM

## 2021-05-04 DIAGNOSIS — R5383 Other fatigue: Secondary | ICD-10-CM | POA: Diagnosis not present

## 2021-05-04 LAB — CBC WITH DIFFERENTIAL/PLATELET
Abs Immature Granulocytes: 36.4 10*3/uL — ABNORMAL HIGH (ref 0.00–0.07)
Basophils Absolute: 1 10*3/uL — ABNORMAL HIGH (ref 0.0–0.1)
Basophils Relative: 1 %
Eosinophils Absolute: 0.3 10*3/uL (ref 0.0–0.5)
Eosinophils Relative: 0 %
HCT: 24.2 % — ABNORMAL LOW (ref 36.0–46.0)
Hemoglobin: 7.7 g/dL — ABNORMAL LOW (ref 12.0–15.0)
Immature Granulocytes: 32 %
Lymphocytes Relative: 7 %
Lymphs Abs: 7.6 10*3/uL — ABNORMAL HIGH (ref 0.7–4.0)
MCH: 29.7 pg (ref 26.0–34.0)
MCHC: 31.8 g/dL (ref 30.0–36.0)
MCV: 93.4 fL (ref 80.0–100.0)
Monocytes Absolute: 6.7 10*3/uL — ABNORMAL HIGH (ref 0.1–1.0)
Monocytes Relative: 6 %
Neutro Abs: 63.1 10*3/uL — ABNORMAL HIGH (ref 1.7–7.7)
Neutrophils Relative %: 54 %
Platelets: 189 10*3/uL (ref 150–400)
RBC: 2.59 MIL/uL — ABNORMAL LOW (ref 3.87–5.11)
RDW: 23.9 % — ABNORMAL HIGH (ref 11.5–15.5)
WBC: 115.2 10*3/uL (ref 4.0–10.5)
nRBC: 3.1 % — ABNORMAL HIGH (ref 0.0–0.2)

## 2021-05-04 LAB — COMPREHENSIVE METABOLIC PANEL
ALT: 18 U/L (ref 0–44)
AST: 46 U/L — ABNORMAL HIGH (ref 15–41)
Albumin: 3.8 g/dL (ref 3.5–5.0)
Alkaline Phosphatase: 150 U/L — ABNORMAL HIGH (ref 38–126)
Anion gap: 8 (ref 5–15)
BUN: 31 mg/dL — ABNORMAL HIGH (ref 8–23)
CO2: 24 mmol/L (ref 22–32)
Calcium: 8.2 mg/dL — ABNORMAL LOW (ref 8.9–10.3)
Chloride: 108 mmol/L (ref 98–111)
Creatinine, Ser: 1.48 mg/dL — ABNORMAL HIGH (ref 0.44–1.00)
GFR, Estimated: 37 mL/min — ABNORMAL LOW (ref >60)
Glucose, Bld: 116 mg/dL — ABNORMAL HIGH (ref 70–99)
Potassium: 3.4 mmol/L — ABNORMAL LOW (ref 3.5–5.1)
Sodium: 140 mmol/L (ref 135–145)
Total Bilirubin: 0.5 mg/dL (ref 0.3–1.2)
Total Protein: 7.4 g/dL (ref 6.5–8.1)

## 2021-05-04 LAB — SAMPLE TO BLOOD BANK

## 2021-05-04 LAB — URIC ACID: Uric Acid, Serum: 3.6 mg/dL (ref 2.5–7.1)

## 2021-05-04 LAB — LACTATE DEHYDROGENASE: LDH: 897 U/L — ABNORMAL HIGH (ref 98–192)

## 2021-05-04 MED ORDER — HEPARIN SOD (PORK) LOCK FLUSH 100 UNIT/ML IV SOLN
500.0000 [IU] | Freq: Once | INTRAVENOUS | Status: AC
  Administered 2021-05-04: 500 [IU] via INTRAVENOUS

## 2021-05-04 MED ORDER — SODIUM CHLORIDE 0.9% FLUSH
10.0000 mL | Freq: Once | INTRAVENOUS | Status: AC
  Administered 2021-05-04: 10 mL via INTRAVENOUS

## 2021-05-04 NOTE — Progress Notes (Signed)
Labs drawn from port.  Patients port flushed without difficulty.  Good blood return noted with no bruising or swelling noted at site.  Band aid applied.  VSS with discharge and left in satisfactory condition with no s/s of distress noted.  

## 2021-05-04 NOTE — Progress Notes (Signed)
CRITICAL VALUE ALERT ?Critical value received:  WBC 115.2 ?Date of notification:  05-04-2021 ?Time of notification: 13:15 pm ?Critical value read back:  Yes.   ?Nurse who received alert:  B. Gessica Jawad RN ?MD notified time and response:  Katragadda.   ?

## 2021-05-04 NOTE — Patient Instructions (Addendum)
[place] at [facility] ?Discharge Instructions ? ?You were seen and examine today by [name]. He reviewed your most recent labs and everything looks stable except your white blood cell count has increased slightly and your kidney function test has went up slightly. [name] is increasing Jakafi to 15 mg twice daily until you receive the new bottle. Continue drinking plenty of water. Please keep follow up appointments as scheduled in 2 weeks. ? ? ?Thank you for choosing [place] at [facility] to provide your oncology and hematology care.  To afford each patient quality time with our provider, please arrive at least 15 minutes before your scheduled appointment time.  ? ?If you have a lab appointment with the [facility] please come in thru the Main Entrance and check in at the main information desk. ? ?You need to re-schedule your appointment should you arrive 10 or more minutes late.  We strive to give you quality time with our providers, and arriving late affects you and other patients whose appointments are after yours.  Also, if you no show three or more times for appointments you may be dismissed from the clinic at the providers discretion.     ?Again, thank you for choosing [facility]  Our hope is that these requests will decrease the amount of time that you wait before being seen by our physicians.       ?_____________________________________________________________ ? ?Should you have questions after your visit to [facility], please contact our office at [phone] and follow the prompts.  Our office hours are 8:00 a.m. and 4:30 p.m. Monday - Friday.  Please note that voicemails left after 4:00 p.m. may not be returned until the following business day.  We are closed weekends and major holidays.  You do have access to a nurse 24-7, just call the main number to the clinic [phone] and do not press any options,  hold on the line and a nurse will answer the phone.   ? ?For prescription refill requests, have your pharmacy contact our office and allow 72 hours.   ? ?Due to Covid, you will need to wear a mask upon entering the hospital. If you do not have a mask, a mask will be given to you at the Main Entrance upon arrival. For doctor visits, patients may have 1 support person age 60 or older with them. For treatment visits, patients can not have anyone with them due to social distancing guidelines and our immunocompromised population.  ? ?  ?

## 2021-05-04 NOTE — Progress Notes (Signed)
? ?[name] ?[address] ?[place], [place] [address] ? ? ?CLINIC:  ?Medical Oncology/Hematology ? ?PCP:  ?Renee Helper, MD ?[address] [place] / [place] [address] ?[phone] ? ? ?REASON FOR VISIT:  ?Follow-up for MPL positive myeloproliferative neoplasm ? ?PRIOR THERAPY: none ? ?CURRENT THERAPY: Jakafi 10 mg twice daily ? ?INTERVAL HISTORY:  ?Renee Waters, a 76 y.o. female, returns for routine follow-up of her MPL positive myeloproliferative neoplasm. Renee Waters was last seen on 04/12/2021.  ? ?Today she reports feeling well. She reports low appetite, and she denies nausea and vomiting. She is taking Jakafi  BID. She denies light headedness and CP. She reports fatigue, and she reports headaches over the past 3 days She reports she hit her left shin against something and now it is sore and has a small lump. She also reports pain in her tailbone; she denies recent falls or injuries to that site. She reports diarrhea last week which has now resolved. She continues to have LUQ abdominal pain when bending over. She is eating 2 meals a day with occasional snacks. She drinks about a half gallon of water a day.  ? ?REVIEW OF SYSTEMS:  ?Review of Systems  ?Constitutional:  Positive for appetite change and fatigue.  ?Cardiovascular:  Negative for chest pain.  ?Gastrointestinal:  Negative for diarrhea (resolved), nausea and vomiting.  ?Musculoskeletal:  Positive for arthralgias (L shin) and myalgias (6/10 buttock).  ?Neurological:  Positive for headaches. Negative for light-headedness.  ?All other systems reviewed and are negative. ? ?PAST MEDICAL/SURGICAL HISTORY:  ?Past Medical History:  ?Diagnosis Date  ? Acute cholangitis   ? Allergy   ? Anemia   ? Anxiety   ? Arthritis   ? Phreesia 03/18/2020  ? Barrett's esophagus   ? Cataract   ? Chronic back pain   ? Chronic neck pain   ? CKD (chronic kidney disease) stage 3, GFR 30-59 ml/min ([place]) 10/29/2020  ? Depression   ? Genital herpes   ?  GERD (gastroesophageal reflux disease)   ? H/O degenerative disc disease   ? History of blood transfusion   ? Hypertension   ? Insomnia   ? Lupus (systemic lupus erythematosus) ([place])   ? Neuromuscular disorder ([place])   ? Osteoarthritis   ? S/P colonoscopy June 2005  ? normal, no polyps  ? S/P endoscopy June 2005, Oct 2009  ? 2005: short-segment Barrett's, 2009: short-segment Barrett's  ? Upper respiratory tract infection 07/09/2020  ? UTI (lower urinary tract infection) 11/2012  ? ?Past Surgical History:  ?Procedure Laterality Date  ? ABDOMINAL HYSTERECTOMY    ? BACK SURGERY    ? BIOPSY N/A 03/20/2014  ? Procedure: BIOPSY;  Surgeon: Daneil Dolin, MD;  Location: [facility];  Service: Endoscopy;  Laterality: N/A;  ? BIOPSY  09/14/2015  ? Procedure: BIOPSY;  Surgeon: Daneil Dolin, MD;  [facility];  Service: Endoscopy;;  esophageal and gastric  ? BIOPSY  07/23/2019  ? Procedure: BIOPSY;  Surgeon: Daneil Dolin, MD;  [facility];  Service: Endoscopy;;  esophageal   ? CARPAL TUNNEL RELEASE Left 2013  ? cervical disectomy  2002  ? CESAREAN SECTION N/A   ? Phreesia 03/18/2020  ? CHOLECYSTECTOMY    ? with lysis of adhesions for sbo; "ruptured gallbladder".  ? COLONOSCOPY  11/09/2011  ? RMR: Melanosis coli  ? COLONOSCOPY WITH PROPOFOL N/A 09/14/2015  ? Dr. Gala Romney: diverticulosis, 43m TA removed. next TCS 09/2020.   ?  COLONOSCOPY WITH PROPOFOL N/A 04/30/2020  ? Procedure: COLONOSCOPY WITH PROPOFOL;  Surgeon: Daneil Dolin, MD;  [facility];  Service: Endoscopy;  Laterality: N/A;  PM (ASA 3)  ? DENTAL SURGERY  11/2015  ? multiple tooth extraction  ? ESOPHAGOGASTRODUODENOSCOPY  11/29/2007  ? salmon-colored  tongue   longest stable at  3 cm, distal esophagus as described previously status post biopsy/ Hiatal hernia, otherwise normal stomach D1 and D2  ? ESOPHAGOGASTRODUODENOSCOPY  01/06/11  ? short segment Barrett's esophagus s/p bx/Hiatal hernia  ? ESOPHAGOGASTRODUODENOSCOPY (EGD) WITH PROPOFOL N/A  03/20/2014  ? DJM:EQASTMHD distal esophagus short segment barrett's, bx with no dysplasia. next egd in 03/2017  ? ESOPHAGOGASTRODUODENOSCOPY (EGD) WITH PROPOFOL N/A 09/14/2015  ? Dr. Gala Romney: Barrett's without dysplasia, gastritis benign bx, hiatal hernia. next EGD 09/2018.  ? ESOPHAGOGASTRODUODENOSCOPY (EGD) WITH PROPOFOL N/A 07/23/2019  ? Procedure: ESOPHAGOGASTRODUODENOSCOPY (EGD) WITH PROPOFOL;  Surgeon: Daneil Dolin, MD;  [facility];  Service: Endoscopy;  Laterality: N/A;  3:00pm  ? EYE SURGERY N/A   ? Phreesia 03/18/2020  ? HERNIA REPAIR Right 07/2010  ? Dr. Zada Girt  ? IR IMAGING GUIDED PORT INSERTION  02/09/2021  ? JOINT REPLACEMENT    ? LAPAROSCOPIC CHOLECYSTECTOMY  2017  ? at [facility]  ? POLYPECTOMY  09/14/2015  ? Procedure: POLYPECTOMY;  Surgeon: Daneil Dolin, MD;  [facility];  Service: Endoscopy;;  ascending colon  ? right hip replacement  07/2010  ? went back in sept 2012 to fix  ? SHOULDER ARTHROSCOPY  2008  ? left  ? SPINE SURGERY N/A   ? Phreesia 03/18/2020  ? TOTAL HIP REVISION Right 12/17/2012  ? Procedure: RIGHT TOTAL HIP REVISION;  Surgeon: Mauri Pole, MD;  Location: [facility];  Service: Orthopedics;  Laterality: Right;  ? WRIST SURGERY Right 2011  ? open reduction right wrist.  ? ? ?SOCIAL HISTORY:  ?Social History  ? ?Socioeconomic History  ? Marital status: Married  ?  Spouse name: Renee Waters  ? Number of children: 4  ? Years of education: 12+  ? Highest education level: Some college, no degree  ?Occupational History  ? Occupation: Press photographer  - retired  ? Occupation: non profit  ?Tobacco Use  ? Smoking status: Former  ?  Packs/day: 0.25  ?  Years: 25.00  ?  Pack years: 6.25  ?  Types: Cigarettes  ?  Quit date: 02/07/2003  ?  Years since quitting: 18.2  ? Smokeless tobacco: Never  ? Tobacco comments:  ?  quit in 2004  ?Vaping Use  ? Vaping Use: Never used  ?Substance and Sexual Activity  ? Alcohol use: No  ? Drug use: No  ? Sexual activity: Not Currently  ?  Birth  control/protection: Surgical  ?Other Topics Concern  ? Not on file  ?Social History Narrative  ? Not on file  ? ?Social Determinants of Health  ? ?Financial Resource Strain: Low Risk   ? Difficulty of Paying Living Expenses: Not hard at all  ?Food Insecurity: No Food Insecurity  ? Worried About Charity fundraiser in the Last Year: Never true  ? Ran Out of Food in the Last Year: Never true  ?Transportation Needs: No Transportation Needs  ? Lack of Transportation (Medical): No  ? Lack of Transportation (Non-Medical): No  ?Physical Activity: Inactive  ? Days of Exercise per Week: 0 days  ? Minutes of Exercise per Session: 0 min  ?Stress: Stress Concern Present  ? Feeling of  Stress : Rather much  ?Social Connections: Moderately Integrated  ? Frequency of Communication with Friends and Family: More than three times a week  ? Frequency of Social Gatherings with Friends and Family: Once a week  ? Attends Religious Services: More than 4 times per year  ? Active Member of Clubs or Organizations: No  ? Attends Archivist Meetings: Never  ? Marital Status: Married  ?Intimate Partner Violence: Not At Risk  ? Fear of Current or Ex-Partner: No  ? Emotionally Abused: No  ? Physically Abused: No  ? Sexually Abused: No  ? ? ?FAMILY HISTORY:  ?Family History  ?Problem Relation Age of Onset  ? Hypertension Mother   ? Stroke Mother   ? Colon cancer Neg Hx   ? Anesthesia problems Neg Hx   ? Hypotension Neg Hx   ? Malignant hyperthermia Neg Hx   ? Pseudochol deficiency Neg Hx   ? Gastric cancer Neg Hx   ? Esophageal cancer Neg Hx   ? ? ?CURRENT MEDICATIONS:  ?Current Outpatient Medications  ?Medication Sig Dispense Refill  ? acyclovir (ZOVIRAX) 800 MG tablet Take 1 tablet (800 mg total) by mouth 5 (five) times daily. 50 tablet 1  ? acyclovir ointment (ZOVIRAX) 5 % Apply 1 application. topically every 3 (three) hours. 15 g 1  ? allopurinol (ZYLOPRIM) 300 MG tablet Take 1 tablet by mouth once daily 30 tablet 0  ? ALPRAZolam  (XANAX) 0.5 MG tablet Take 1 tablet (0.5 mg total) by mouth at bedtime. 30 tablet 5  ? cetirizine (ZYRTEC) 10 MG tablet Take 1 tablet (10 mg total) by mouth daily. 30 tablet 11  ? citalopram (CELEXA) 20 MG tablet Take 1 table

## 2021-05-05 MED ORDER — OCTREOTIDE ACETATE 30 MG IM KIT
PACK | INTRAMUSCULAR | Status: AC
  Filled 2021-05-05: qty 1

## 2021-05-07 DIAGNOSIS — M159 Polyosteoarthritis, unspecified: Secondary | ICD-10-CM

## 2021-05-07 DIAGNOSIS — I10 Essential (primary) hypertension: Secondary | ICD-10-CM

## 2021-05-07 DIAGNOSIS — F418 Other specified anxiety disorders: Secondary | ICD-10-CM

## 2021-05-07 DIAGNOSIS — D649 Anemia, unspecified: Secondary | ICD-10-CM

## 2021-05-10 ENCOUNTER — Other Ambulatory Visit (HOSPITAL_COMMUNITY): Payer: Self-pay

## 2021-05-10 MED ORDER — RUXOLITINIB PHOSPHATE 15 MG PO TABS
15.0000 mg | ORAL_TABLET | Freq: Two times a day (BID) | ORAL | 1 refills | Status: DC
  Filled 2021-05-10: qty 60, 30d supply, fill #0

## 2021-05-10 NOTE — Progress Notes (Signed)
New prescription for Jakafi '15mg'$  BID sent to [facility] per [name] ?

## 2021-05-11 ENCOUNTER — Other Ambulatory Visit: Payer: Self-pay | Admitting: Family Medicine

## 2021-05-11 ENCOUNTER — Other Ambulatory Visit (HOSPITAL_COMMUNITY): Payer: Self-pay

## 2021-05-11 MED ORDER — RUXOLITINIB PHOSPHATE 15 MG PO TABS: 15.0000 mg | ORAL_TABLET | Freq: Two times a day (BID) | ORAL | 3 refills | Status: DC

## 2021-05-13 ENCOUNTER — Ambulatory Visit (INDEPENDENT_AMBULATORY_CARE_PROVIDER_SITE_OTHER): Payer: Medicare Other | Admitting: *Deleted

## 2021-05-13 DIAGNOSIS — G8929 Other chronic pain: Secondary | ICD-10-CM

## 2021-05-13 DIAGNOSIS — I10 Essential (primary) hypertension: Secondary | ICD-10-CM

## 2021-05-13 NOTE — Chronic Care Management (AMB) (Signed)
?Chronic Care Management  ? ?CCM RN Visit Note ? ?05/13/2021 ?Name: Renee Waters MRN: [id number] DOB: 1945/10/27 ? ?Subjective: ?Renee Waters is a 76 y.o. year old female who is a primary care patient of [name], MD. The care management team was consulted for assistance with disease management and care coordination needs.   ? ?Engaged with patient by telephone for follow up visit in response to provider referral for case management and/or care coordination services.  ? ?Consent to Services:  ?The patient was given information about Chronic Care Management services, agreed to services, and gave verbal consent prior to initiation of services.  Please see initial visit note for detailed documentation.  ? ?Patient agreed to services and verbal consent obtained.  ? ?Assessment: Review of patient past medical history, allergies, medications, health status, including review of consultants reports, laboratory and other test data, was performed as part of comprehensive evaluation and provision of chronic care management services.  ? ?SDOH (Social Determinants of Health) assessments and interventions performed:   ? ?CCM Care Plan ? ?Allergies  ?Allergen Reactions  ? Bee Venom Swelling and Hives  ? Acetaminophen   ?  itches  ? Amlodipine   ?  Hair loss  ? Tyloxapol   ? Other Itching, Rash and Swelling  ? Oxycodone-Acetaminophen Rash  ?  Pt states, "this gives her a rash, but at home she takes oxycodone for pain relief" ?Pt states, "this gives her a rash, but at home she takes oxycodone for pain relief"  ? ? ?Outpatient Encounter Medications as of 05/13/2021  ?Medication Sig Note  ? acyclovir (ZOVIRAX) 800 MG tablet Take 1 tablet (800 mg total) by mouth 5 (five) times daily.   ? acyclovir ointment (ZOVIRAX) 5 % Apply 1 application. topically every 3 (three) hours.   ? allopurinol (ZYLOPRIM) 300 MG tablet Take 1 tablet by mouth once daily   ? ALPRAZolam (XANAX) 0.5 MG tablet Take 1 tablet (0.5 mg total) by mouth at  bedtime.   ? cetirizine (ZYRTEC) 10 MG tablet Take 1 tablet (10 mg total) by mouth daily.   ? citalopram (CELEXA) 20 MG tablet Take 1 tablet (20 mg total) by mouth daily.   ? EPINEPHRINE 0.3 mg/0.3 mL IJ SOAJ injection INJECT 0.3 MLS INTO MUSCLE ONCE AS NEEDED   ? gabapentin (NEURONTIN) 300 MG capsule Take one capsule by mouth once daily, as needed, for pain   ? hydrochlorothiazide (HYDRODIURIL) 25 MG tablet Take 1 tablet by mouth once daily   ? losartan (COZAAR) 25 MG tablet Take 1 tablet by mouth once daily for blood pressure   ? meloxicam (MOBIC) 7.5 MG tablet Take 1 tablet (7.5 mg total) by mouth daily as needed for pain (knee pain).   ? mirtazapine (REMERON) 15 MG tablet TAKE 1 TABLET BY MOUTH AT BEDTIME   ? oxyCODONE (OXY IR/ROXICODONE) 5 MG immediate release tablet Take 5 mg by mouth every 4 (four) hours as needed.   ? Oxycodone HCl 10 MG TABS Take 1 tablet (10 mg total) by mouth 4 (four) times daily.   ? pantoprazole (PROTONIX) 40 MG tablet TAKE 1 TABLET BY MOUTH ONCE DAILY BEFORE BREAKFAST   ? potassium chloride SA (KLOR-CON M) 10 MEQ tablet Take 1 tablet (10 mEq total) by mouth daily.   ? Probiotic Product (EQL DAILY PROBIOTIC PO) Take 1 capsule by mouth daily.   ? ruxolitinib phosphate (JAKAFI) 15 MG tablet Take 1 tablet (15 mg total) by mouth 2 (two) times daily.   ?  temazepam (RESTORIL) 30 MG capsule Take 1 capsule (30 mg total) by mouth at bedtime as needed for sleep.   ? tiZANidine (ZANAFLEX) 4 MG tablet Take 8 mg by mouth 3 (three) times daily as needed (spasms).   ? lidocaine-prilocaine (EMLA) cream Apply 1 application topically as needed. (Patient not taking: Reported on 05/13/2021) 02/09/2021: Not started yet  ? ?Facility-Administered Encounter Medications as of 05/13/2021  ?Medication  ? octreotide (SANDOSTATIN LAR) 30 MG IM injection  ? ? ?Patient Active Problem List  ? Diagnosis Date Noted  ? MPN (myeloproliferative neoplasm) ([place]) 04/21/2021  ? Anxiety 04/05/2021  ? Leukocytosis 11/09/2020  ? CKD  (chronic kidney disease) stage 3, GFR 30-59 ml/min ([facility]) 10/29/2020  ? Dermatomycosis 08/02/2020  ? HSV-2 (herpes simplex virus 2) infection 08/02/2020  ? Rib pain on left side 03/21/2020  ? Itching 03/21/2020  ? Mildly underweight adult 09/19/2019  ? Educated about COVID-19 virus infection 06/24/2018  ? MDD (major depressive disorder), recurrent episode, moderate ([facility]) 10/28/2016  ? Thoracic spine pain 08/31/2016  ? Anemia, normocytic normochromic 05/27/2015  ? IDA (iron deficiency anemia) 03/02/2015  ? Unsteady gait 02/25/2015  ? Mucosal abnormality of esophagus   ? Systemic lupus ([place]) 10/31/2012  ? Vitamin D deficiency 03/16/2012  ? Generalized pain 03/15/2012  ? Constipation 10/06/2011  ? Hip pain 06/29/2011  ? Barrett's esophagus 12/13/2010  ? Western blot positive HSV2 06/22/2007  ? Depression 06/22/2007  ? Essential hypertension 06/22/2007  ? GERD (gastroesophageal reflux disease) 06/22/2007  ? DJD (degenerative joint disease) 06/22/2007  ? NECK PAIN, CHRONIC 06/22/2007  ? Chronic midline low back pain with sciatica 06/22/2007  ? Insomnia secondary to depression with anxiety 06/22/2007  ? Viral infection, unspecified 06/22/2007  ? ? ?Conditions to be addressed/monitored:HTN and Chronic Pain ? ?Care Plan : RN Care Manager plan of care  ?Updates made by Kassie Mends, RN since 05/13/2021 12:00 AM  ?  ? ?Problem: No plan of care established for management of chronic disease states  (HTN, Chronic Pain)   ?Priority: High  ?  ? ?Long-Range Goal: Development of plan of care for chronic disease management (HTN, Chronic Pain)   ?Start Date: 12/01/2020  ?Expected End Date: 08/24/2021  ?Priority: High  ?Note:   ?Current Barriers:  ?Knowledge Deficits related to plan of care for management of HTN and Chronic Pain  ?Knowledge Deficits related to self-health management of acute or chronic pain- pt has unmanaged chronic pain (neck, lower back and " all over" DJD). Pt reports she has pain medication which does help.  Patient  reports she has "a lot of anxiety and depression can vary".  Patient reports she is working with CCM LCSW on management of anxiety, stress, depression. Pt reports spouse trying to cook healthy meals and she is gaining some weight back that she previously lost, weight is now 124 pounds, pt occasionally gets out in yard, she intends to check on going to a gym for water aerobics.  Patient reports she has chronic insomnia and is on medication and this helps. ?Chronic Disease Management support and education needs related to chronic pain ?Care Coordination needs related to anxiety, stress, depression in a patient with chronic pain ?Chronic Disease Management support and education needs related to pain management, management of stress and anxiety.  ?Knowledge Deficits related to basic understanding of hypertension pathophysiology and self care management-  patient reports she lives with her spouse, has other relatives she can call on if needed, does check blood pressure daily with readings "  mostly normal".  Reports having all medications and taking as prescribed, uses cane at home, has walker if needed.  Pt reports she continues following up with oncologist [name] for leukemia, pt reports she did receive the flu vaccine, is trying to eat healthy as possible. ?RNCM Clinical Goal(s):  ?Patient will verbalize understanding of plan for management of HTN and Chronic Pain as evidenced by patient report, review EHR and  through collaboration with RN Care manager, provider, and care team.  ? ?Interventions: ?1:1 collaboration with primary care provider regarding development and update of comprehensive plan of care as evidenced by provider attestation and co-signature ?Inter-disciplinary care team collaboration (see longitudinal plan of care) ?Evaluation of current treatment plan related to  self management and patient's adherence to plan as established by provider ? ? ?Hypertension Interventions:  (Status:  Goal on track:   Yes.) Long Term Goal ?Last practice recorded BP readings:  ?BP Readings from Last 3 Encounters:  ?12/14/20 108/71  ?11/30/20 92/62  ?11/18/20 (!) 162/72  ?Most recent eGFR/CrCl:  ?Lab Results  ?[facility]

## 2021-05-13 NOTE — Patient Instructions (Signed)
Visit Information ? ?Thank you for taking time to visit with me today. Please don't hesitate to contact me if I can be of assistance to you before our next scheduled telephone appointment. ? ?Following are the goals we discussed today:  ?Take all medications as prescribed ?Attend all scheduled provider appointments ?Call provider office for new concerns or questions  ?Work with the Education officer, museum to address care coordination needs and will continue to work with the clinical team to address health care and disease management related needs ?check blood pressure daily ?keep a blood pressure log ?take blood pressure log to all doctor appointments ?call doctor for signs and symptoms of high blood pressure ?keep all doctor appointments ?take medications for blood pressure exactly as prescribed ?report new symptoms to your doctor ?eat more whole grains, fruits and vegetables, lean meats and healthy fats ?Take pain medication as prescribed ?Try to get outside in the sunshine daily ?Try doing some type of exercise daily, walking is good ?Practice good handwashing, avoid sick people ?Alternate activity with rest ?Practice relaxation and keep stress to a minimum ?Ask for help as needed ?Continue working with Education officer, museum ? ?Our next appointment is by telephone on 08/03/21 at 215 pm ? ?Please call the care guide team at [phone] if you need to cancel or reschedule your appointment.  ? ?If you are experiencing a Mental Health or [place] or need someone to talk to, please call the Suicide and Crisis Lifeline: 988 ?call the [place] National Suicide Prevention Lifeline: 760-498-1088 or TTY: 484-306-6363 TTY 6130115906) to talk to a trained counselor ?call 1-800-273-TALK (toll free, 24 hour hotline) ?go to [facility] [address] [place] [phone]) ?call the [facility]: [phone] ?call 911  ? ?Patient verbalizes understanding of instructions  and care plan provided today and agrees to view in [facility]. Active MyChart status confirmed with patient.   ? ?Jacqlyn Larsen RNC, BSN ?RN Case Manager ?[place] ?[phone] ? ?Chronic Back Pain ?When back pain lasts longer than 3 months, it is called chronic back pain. Pain may get worse at certain times (flare-ups). There are things you can do at home to manage your pain. ?Follow these instructions at home: ?Pay attention to any changes in your symptoms. Take these actions to help with your pain: ?Managing pain and stiffness ?  ?If told, put ice on the painful area. Your doctor may tell you to use ice for 24-48 hours after the flare-up starts. To do this: ?Put ice in a plastic bag. ?Place a towel between your skin and the bag. ?Leave the ice on for 20 minutes, 2-3 times a day. ?If told, put heat on the painful area. Do this as often as told by your doctor. Use the heat source that your doctor recommends, such as a moist heat pack or a heating pad. ?Place a towel between your skin and the heat source. ?Leave the heat on for 20-30 minutes. ?Take off the heat if your skin turns bright red. This is especially important if you are unable to feel pain, heat, or cold. You may have a greater risk of getting burned. ?Soak in a warm bath. This can help relieve pain. ?Activity ? ?Avoid bending and other activities that make pain worse. ?When standing: ?Keep your upper back and neck straight. ?Keep your shoulders pulled back. ?Avoid slouching. ?When sitting: ?Keep your back straight. ?Relax your shoulders. Do not round your shoulders or pull them backward. ?Do not  sit or stand in one place for long periods of time. ?Take short rest breaks during the day. Lying down or standing is usually better than sitting. Resting can help relieve pain. ?When sitting or lying down for a long time, do some mild activity or stretching. This will help to prevent stiffness and pain. ?Get regular exercise. Ask your doctor what  activities are safe for you. ?Do not lift anything that is heavier than 10 lb (4.5 kg) or the limit that you are told, until your doctor says that it is safe. ?To prevent injury when you lift things: ?Bend your knees. ?Keep the weight close to your body. ?Avoid twisting. ?Sleep on a firm mattress. Try lying on your side with your knees slightly bent. If you lie on your back, put a pillow under your knees. ?Medicines ?Treatment may include medicines for pain and swelling taken by mouth or put on the skin, prescription pain medicine, or muscle relaxants. ?Take over-the-counter and prescription medicines only as told by your doctor. ?Ask your doctor if the medicine prescribed to you: ?Requires you to avoid driving or using machinery. ?Can cause trouble pooping (constipation). You may need to take these actions to prevent or treat trouble pooping: ?Drink enough fluid to keep your pee (urine) pale yellow. ?Take over-the-counter or prescription medicines. ?Eat foods that are high in fiber. These include beans, whole grains, and fresh fruits and vegetables. ?Limit foods that are high in fat and sugars. These include fried or sweet foods. ?General instructions ?Do not use any products that contain nicotine or tobacco, such as cigarettes, e-cigarettes, and chewing tobacco. If you need help quitting, ask your doctor. ?Keep all follow-up visits as told by your doctor. This is important. ?Contact a doctor if: ?Your pain does not get better with rest or medicine. ?Your pain gets worse, or you have new pain. ?You have a high fever. ?You lose weight very quickly. ?You have trouble doing your normal activities. ?Get help right away if: ?One or both of your legs or feet feel weak. ?One or both of your legs or feet lose feeling (have numbness). ?You have trouble controlling when you poop (have a bowel movement) or pee (urinate). ?You have bad back pain and: ?You feel like you may vomit (nauseous), or you vomit. ?You have pain in your  belly (abdomen). ?You have shortness of breath. ?You faint. ?Summary ?When back pain lasts longer than 3 months, it is called chronic back pain. ?Pain may get worse at certain times (flare-ups). ?Use ice and heat as told by your doctor. Your doctor may tell you to use ice after flare-ups. ?This information is not intended to replace advice given to you by your health care provider. Make sure you discuss any questions you have with your health care provider. ?Document Revised: 03/06/2019 Document Reviewed: 03/06/2019 ?Elsevier Patient Education ? [place]. ? ?

## 2021-05-19 ENCOUNTER — Telehealth: Payer: Self-pay

## 2021-05-21 ENCOUNTER — Telehealth: Payer: Medicare Other

## 2021-05-25 ENCOUNTER — Inpatient Hospital Stay (HOSPITAL_COMMUNITY): Payer: Medicare Other | Attending: Hematology | Admitting: Hematology

## 2021-05-25 ENCOUNTER — Other Ambulatory Visit (HOSPITAL_COMMUNITY): Payer: Self-pay | Admitting: Hematology

## 2021-05-25 ENCOUNTER — Inpatient Hospital Stay (HOSPITAL_COMMUNITY): Payer: Medicare Other

## 2021-05-25 DIAGNOSIS — Z87891 Personal history of nicotine dependence: Secondary | ICD-10-CM | POA: Diagnosis not present

## 2021-05-25 DIAGNOSIS — E876 Hypokalemia: Secondary | ICD-10-CM | POA: Insufficient documentation

## 2021-05-25 DIAGNOSIS — D471 Chronic myeloproliferative disease: Secondary | ICD-10-CM | POA: Diagnosis not present

## 2021-05-25 DIAGNOSIS — C946 Myelodysplastic disease, not classified: Secondary | ICD-10-CM | POA: Insufficient documentation

## 2021-05-25 DIAGNOSIS — Z79899 Other long term (current) drug therapy: Secondary | ICD-10-CM | POA: Diagnosis not present

## 2021-05-25 DIAGNOSIS — E790 Hyperuricemia without signs of inflammatory arthritis and tophaceous disease: Secondary | ICD-10-CM | POA: Insufficient documentation

## 2021-05-25 DIAGNOSIS — N183 Chronic kidney disease, stage 3 unspecified: Secondary | ICD-10-CM | POA: Insufficient documentation

## 2021-05-25 DIAGNOSIS — D72829 Elevated white blood cell count, unspecified: Secondary | ICD-10-CM

## 2021-05-25 LAB — COMPREHENSIVE METABOLIC PANEL
ALT: 16 U/L (ref 0–44)
AST: 38 U/L (ref 15–41)
Albumin: 3.7 g/dL (ref 3.5–5.0)
Alkaline Phosphatase: 149 U/L — ABNORMAL HIGH (ref 38–126)
Anion gap: 5 (ref 5–15)
BUN: 31 mg/dL — ABNORMAL HIGH (ref 8–23)
CO2: 23 mmol/L (ref 22–32)
Calcium: 8.2 mg/dL — ABNORMAL LOW (ref 8.9–10.3)
Chloride: 107 mmol/L (ref 98–111)
Creatinine, Ser: 1.33 mg/dL — ABNORMAL HIGH (ref 0.44–1.00)
GFR, Estimated: 42 mL/min — ABNORMAL LOW (ref >60)
Glucose, Bld: 102 mg/dL — ABNORMAL HIGH (ref 70–99)
Potassium: 3.8 mmol/L (ref 3.5–5.1)
Sodium: 135 mmol/L (ref 135–145)
Total Bilirubin: 0.5 mg/dL (ref 0.3–1.2)
Total Protein: 7.2 g/dL (ref 6.5–8.1)

## 2021-05-25 LAB — MAGNESIUM: Magnesium: 2 mg/dL (ref 1.7–2.4)

## 2021-05-25 LAB — PHOSPHORUS: Phosphorus: 3.7 mg/dL (ref 2.5–4.6)

## 2021-05-25 LAB — URIC ACID: Uric Acid, Serum: 3.3 mg/dL (ref 2.5–7.1)

## 2021-05-25 LAB — SAMPLE TO BLOOD BANK

## 2021-05-25 LAB — LACTATE DEHYDROGENASE: LDH: 731 U/L — ABNORMAL HIGH (ref 98–192)

## 2021-05-25 LAB — PREPARE RBC (CROSSMATCH)

## 2021-05-25 MED ORDER — ACETAMINOPHEN 325 MG PO TABS: 650.0000 mg | ORAL_TABLET | Freq: Once | ORAL | Status: DC

## 2021-05-25 MED ORDER — SODIUM CHLORIDE 0.9% FLUSH
10.0000 mL | Freq: Once | INTRAVENOUS | Status: AC
  Administered 2021-05-25: 10 mL via INTRAVENOUS

## 2021-05-25 MED ORDER — HEPARIN SOD (PORK) LOCK FLUSH 100 UNIT/ML IV SOLN
500.0000 [IU] | Freq: Once | INTRAVENOUS | Status: AC
Start: 2021-05-25 — End: 2021-05-25
  Administered 2021-05-25: 500 [IU] via INTRAVENOUS

## 2021-05-25 NOTE — Progress Notes (Signed)
? ?[facility] ?[address] ?[place], [place] [address] ? ? ?CLINIC:  ?Medical Oncology/Hematology ? ?PCP:  ?[name], MD ?[address] [place] / [place] [address] ?[phone] ? ? ?REASON FOR VISIT:  ?Follow-up for MPL positive myeloproliferative neoplasm ? ?PRIOR THERAPY: none ? ?CURRENT THERAPY: Jakafi 10 mg twice daily ? ?INTERVAL HISTORY:  ?[name], a 76 y.o. female, returns for routine follow-up of her MPL positive myeloproliferative neoplasm. Renee Waters was last seen on 05/04/2021.  ? ?Today she reports feeling good. She started ruxolitinib 15 mg twice daily 1 week ago. She reports itching all over her body since January which has worsened over the last month; she has not noticed a correlation to taking a shower. She reports the itching has improved since starting washing with peroxide and using a scent-free lotion 2 days ago. She reports burning in her shins. Her appetite is poor. She has lost 1 lb since her last visit. She reports an episode of dizziness yesterday.  ? ?REVIEW OF SYSTEMS:  ?Review of Systems  ?Constitutional:  Positive for appetite change and fatigue.  ?Musculoskeletal:  Positive for arthralgias (6/10 lower legs) and back pain (6/10).  ?Neurological:  Positive for dizziness and headaches.  ?Psychiatric/Behavioral:  Positive for depression. The patient is nervous/anxious.   ?All other systems reviewed and are negative. ? ?PAST MEDICAL/SURGICAL HISTORY:  ?Past Medical History:  ?Diagnosis Date  ? Acute cholangitis   ? Allergy   ? Anemia   ? Anxiety   ? Arthritis   ? Phreesia 03/18/2020  ? Barrett's esophagus   ? Cataract   ? Chronic back pain   ? Chronic neck pain   ? CKD (chronic kidney disease) stage 3, GFR 30-59 ml/min ([place]) 10/29/2020  ? Depression   ? Genital herpes   ? GERD (gastroesophageal reflux disease)   ? H/O degenerative disc disease   ? History of blood transfusion   ? Hypertension   ? Insomnia   ? Lupus (systemic lupus erythematosus) ([place])    ? Neuromuscular disorder ([place])   ? Osteoarthritis   ? S/P colonoscopy June 2005  ? normal, no polyps  ? S/P endoscopy June 2005, Oct 2009  ? 2005: short-segment Barrett's, 2009: short-segment Barrett's  ? Upper respiratory tract infection 07/09/2020  ? UTI (lower urinary tract infection) 11/2012  ? ?Past Surgical History:  ?Procedure Laterality Date  ? ABDOMINAL HYSTERECTOMY    ? BACK SURGERY    ? BIOPSY N/A 03/20/2014  ? Procedure: BIOPSY;  Surgeon: Daneil Dolin, MD;  Location: [facility];  Service: Endoscopy;  Laterality: N/A;  ? BIOPSY  09/14/2015  ? Procedure: BIOPSY;  Surgeon: Daneil Dolin, MD;  Location[facility];  Service: Endoscopy;;  esophageal and gastric  ? BIOPSY  07/23/2019  ? Procedure: BIOPSY;  Surgeon: Daneil Dolin, MD;  Location: [facility];  Service: Endoscopy;;  esophageal   ? CARPAL TUNNEL RELEASE Left 2013  ? cervical disectomy  2002  ? CESAREAN SECTION N/A   ? Phreesia 03/18/2020  ? CHOLECYSTECTOMY    ? with lysis of adhesions for sbo; "ruptured gallbladder".  ? COLONOSCOPY  11/09/2011  ? RMR: Melanosis coli  ? COLONOSCOPY WITH PROPOFOL N/A 09/14/2015  ? Dr. Gala Romney: diverticulosis, 63m TA removed. next TCS 09/2020.   ? COLONOSCOPY WITH PROPOFOL N/A 04/30/2020  ? Procedure: COLONOSCOPY WITH PROPOFOL;  Surgeon: RDaneil Dolin MD;  [facility];  Service: Endoscopy;  Laterality: N/A;  PM (  ASA 3)  ? DENTAL SURGERY  11/2015  ? multiple tooth extraction  ? ESOPHAGOGASTRODUODENOSCOPY  11/29/2007  ? salmon-colored  tongue   longest stable at  3 cm, distal esophagus as described previously status post biopsy/ Hiatal hernia, otherwise normal stomach D1 and D2  ? ESOPHAGOGASTRODUODENOSCOPY  01/06/11  ? short segment Barrett's esophagus s/p bx/Hiatal hernia  ? ESOPHAGOGASTRODUODENOSCOPY (EGD) WITH PROPOFOL N/A 03/20/2014  ? IRW:ERXVQMGQ distal esophagus short segment barrett's, bx with no dysplasia. next egd in 03/2017  ? ESOPHAGOGASTRODUODENOSCOPY (EGD) WITH PROPOFOL N/A 09/14/2015  ? Dr. Gala Romney:  Barrett's without dysplasia, gastritis benign bx, hiatal hernia. next EGD 09/2018.  ? ESOPHAGOGASTRODUODENOSCOPY (EGD) WITH PROPOFOL N/A 07/23/2019  ? Procedure: ESOPHAGOGASTRODUODENOSCOPY (EGD) WITH PROPOFOL;  Surgeon: Daneil Dolin, MD;  [facility];  Service: Endoscopy;  Laterality: N/A;  3:00pm  ? EYE SURGERY N/A   ? Phreesia 03/18/2020  ? HERNIA REPAIR Right 07/2010  ? Dr. Zada Girt  ? IR IMAGING GUIDED PORT INSERTION  02/09/2021  ? JOINT REPLACEMENT    ? LAPAROSCOPIC CHOLECYSTECTOMY  2017  ? at [facility]  ? POLYPECTOMY  09/14/2015  ? Procedure: POLYPECTOMY;  Surgeon: Daneil Dolin, MD;  [facility];  Service: Endoscopy;;  ascending colon  ? right hip replacement  07/2010  ? went back in sept 2012 to fix  ? SHOULDER ARTHROSCOPY  2008  ? left  ? SPINE SURGERY N/A   ? Phreesia 03/18/2020  ? TOTAL HIP REVISION Right 12/17/2012  ? Procedure: RIGHT TOTAL HIP REVISION;  Surgeon: Mauri Pole, MD;  Location: [facility];  Service: Orthopedics;  Laterality: Right;  ? WRIST SURGERY Right 2011  ? open reduction right wrist.  ? ? ?SOCIAL HISTORY:  ?Social History  ? ?Socioeconomic History  ? Marital status: Married  ?  Spouse name: louis  ? Number of children: 4  ? Years of education: 12+  ? Highest education level: Some college, no degree  ?Occupational History  ? Occupation: Press photographer  - retired  ? Occupation: non profit  ?Tobacco Use  ? Smoking status: Former  ?  Packs/day: 0.25  ?  Years: 25.00  ?  Pack years: 6.25  ?  Types: Cigarettes  ?  Quit date: 02/07/2003  ?  Years since quitting: 18.3  ? Smokeless tobacco: Never  ? Tobacco comments:  ?  quit in 2004  ?Vaping Use  ? Vaping Use: Never used  ?Substance and Sexual Activity  ? Alcohol use: No  ? Drug use: No  ? Sexual activity: Not Currently  ?  Birth control/protection: Surgical  ?Other Topics Concern  ? Not on file  ?Social History Narrative  ? Not on file  ? ?Social Determinants of Health  ? ?Financial Resource Strain: Low Risk   ? Difficulty of  Paying Living Expenses: Not hard at all  ?Food Insecurity: No Food Insecurity  ? Worried About Charity fundraiser in the Last Year: Never true  ? Ran Out of Food in the Last Year: Never true  ?Transportation Needs: No Transportation Needs  ? Lack of Transportation (Medical): No  ? Lack of Transportation (Non-Medical): No  ?Physical Activity: Inactive  ? Days of Exercise per Week: 0 days  ? Minutes of Exercise per Session: 0 min  ?Stress: Stress Concern Present  ? Feeling of Stress : Rather much  ?Social Connections: Moderately Integrated  ? Frequency of Communication with Friends and Family: More than three times a week  ? Frequency of Social Gatherings  with Friends and Family: Once a week  ? Attends Religious Services: More than 4 times per year  ? Active Member of Clubs or Organizations: No  ? Attends Archivist Meetings: Never  ? Marital Status: Married  ?Intimate Partner Violence: Not At Risk  ? Fear of Current or Ex-Partner: No  ? Emotionally Abused: No  ? Physically Abused: No  ? Sexually Abused: No  ? ? ?FAMILY HISTORY:  ?Family History  ?Problem Relation Age of Onset  ? Hypertension Mother   ? Stroke Mother   ? Colon cancer Neg Hx   ? Anesthesia problems Neg Hx   ? Hypotension Neg Hx   ? Malignant hyperthermia Neg Hx   ? Pseudochol deficiency Neg Hx   ? Gastric cancer Neg Hx   ? Esophageal cancer Neg Hx   ? ? ?CURRENT MEDICATIONS:  ?Current Outpatient Medications  ?Medication Sig Dispense Refill  ? acyclovir (ZOVIRAX) 800 MG tablet Take 1 tablet (800 mg total) by mouth 5 (five) times daily. 50 tablet 1  ? acyclovir ointment (ZOVIRAX) 5 % Apply 1 application. topically every 3 (three) hours. 15 g 1  ? allopurinol (ZYLOPRIM) 300 MG tablet Take 1 tablet by mouth once daily 30 tablet 0  ? ALPRAZolam (XANAX) 0.5 MG tablet Take 1 tablet (0.5 mg total) by mouth at bedtime. 30 tablet 5  ? cetirizine (ZYRTEC) 10 MG tablet Take 1 tablet (10 mg total) by mouth daily. 30 tablet 11  ? citalopram (CELEXA) 20 MG  tablet Take 1 tablet (20 mg total) by mouth daily. 30 tablet 3  ? EPINEPHRINE 0.3 mg/0.3 mL IJ SOAJ injection INJECT 0.3 MLS INTO MUSCLE ONCE AS NEEDED 1 Device 2  ? gabapentin (NEURONTIN) 300 MG cap

## 2021-05-25 NOTE — Patient Instructions (Signed)
[facility]  Discharge Instructions: Thank you for choosing [facility] to provide your oncology and hematology care.  If you have a lab appointment with the Cancer Center, please come in thru the Main Entrance and check in at the main information desk.  Wear comfortable clothing and clothing appropriate for easy access to any Portacath or PICC line.   We strive to give you quality time with your provider. You may need to reschedule your appointment if you arrive late (15 or more minutes).  Arriving late affects you and other patients whose appointments are after yours.  Also, if you miss three or more appointments without notifying the office, you may be dismissed from the clinic at the provider's discretion.      For prescription refill requests, have your pharmacy contact our office and allow 72 hours for refills to be completed.    Today you received the following chemotherapy and/or immunotherapy agents PORT flush labs      To help prevent nausea and vomiting after your treatment, we encourage you to take your nausea medication as directed.  BELOW ARE SYMPTOMS THAT SHOULD BE REPORTED IMMEDIATELY: *FEVER GREATER THAN 100.4 F (38 C) OR HIGHER *CHILLS OR SWEATING *NAUSEA AND VOMITING THAT IS NOT CONTROLLED WITH YOUR NAUSEA MEDICATION *UNUSUAL SHORTNESS OF BREATH *UNUSUAL BRUISING OR BLEEDING *URINARY PROBLEMS (pain or burning when urinating, or frequent urination) *BOWEL PROBLEMS (unusual diarrhea, constipation, pain near the anus) TENDERNESS IN MOUTH AND THROAT WITH OR WITHOUT PRESENCE OF ULCERS (sore throat, sores in mouth, or a toothache) UNUSUAL RASH, SWELLING OR PAIN  UNUSUAL VAGINAL DISCHARGE OR ITCHING   Items with * indicate a potential emergency and should be followed up as soon as possible or go to the Emergency Department if any problems should occur.  Please show the CHEMOTHERAPY ALERT CARD or IMMUNOTHERAPY ALERT CARD at check-in to the Emergency  Department and triage nurse.  Should you have questions after your visit or need to cancel or reschedule your appointment, please contact  CANCER CENTER [phone]  and follow the prompts.  Office hours are 8:00 a.m. to 4:30 p.m. Monday - Friday. Please note that voicemails left after 4:00 p.m. may not be returned until the following business day.  We are closed weekends and major holidays. You have access to a nurse at all times for urgent questions. Please call the main number to the clinic [phone] and follow the prompts.  For any non-urgent questions, you may also contact your provider using MyChart. We now offer e-Visits for anyone 18 and older to request care online for non-urgent symptoms. For details visit [email or web address].   Also download the MyChart app! Go to the app store, search "MyChart", open the app, select , and log in with your MyChart username and password.  Due to Covid, a mask is required upon entering the hospital/clinic. If you do not have a mask, one will be given to you upon arrival. For doctor visits, patients may have 1 support person aged 18 or older with them. For treatment visits, patients cannot have anyone with them due to current Covid guidelines and our immunocompromised population.  

## 2021-05-25 NOTE — Addendum Note (Signed)
Addended by: Benjiman Core D on: 05/25/2021 02:50 PM ? ? Modules accepted: Orders ? ?

## 2021-05-25 NOTE — Patient Instructions (Signed)
[place] [facility] ?Discharge Instructions ? ?You were seen and examined today by [name]. ? ?[name] discussed your most recent lab work and it shows that your platelets have slightly decreased from 115 to 103 which is an improvement in the right direction. Your hemoglobin has also decreased from 7.7 to 6.4 which can be a side effect of the medication in the beginning. We will get you set up for 2 units of blood transfusion.  ? ?Please keep follow-up as scheduled.  ? ? ?Thank you for choosing [facility] to provide your oncology and hematology care.  To afford each patient quality time with our provider, please arrive at least 15 minutes before your scheduled appointment time.  ? ?If you have a lab appointment with the [facility] please come in thru the Main Entrance and check in at the main information desk. ? ?You need to re-schedule your appointment should you arrive 10 or more minutes late.  We strive to give you quality time with our providers, and arriving late affects you and other patients whose appointments are after yours.  Also, if you no show three or more times for appointments you may be dismissed from the clinic at the providers discretion.     ?Again, thank you for choosing [facility].  Our hope is that these requests will decrease the amount of time that you wait before being seen by our physicians.       ?_____________________________________________________________ ? ?Should you have questions after your visit to [facility], please contact our office at [phone] and follow the prompts.  Our office hours are 8:00 a.m. and 4:30 p.m. Monday - Friday.  Please note that voicemails left after 4:00 p.m. may not be returned until the following business day.  We are closed weekends and major holidays.  You do have access to a nurse 24-7, just call the main number to the clinic [phone]  and do not press any options, hold on the line and a nurse will answer the phone.   ? ?For prescription refill requests, have your pharmacy contact our office and allow 72 hours.   ? ?Due to Covid, you will need to wear a mask upon entering the hospital. If you do not have a mask, a mask will be given to you at the Main Entrance upon arrival. For doctor visits, patients may have 1 support person age 62 or older with them. For treatment visits, patients can not have anyone with them due to social distancing guidelines and our immunocompromised population.  ? ?  ?

## 2021-05-25 NOTE — Progress Notes (Signed)
Patients port flushed without difficulty.  Good blood return noted with no bruising or swelling noted at site.  Stable during access and blood draw.  Band aid applied.  VSS with discharge and left in satisfactory condition with no s/s of distress noted.   ?

## 2021-05-25 NOTE — Progress Notes (Signed)
CRITICAL VALUE ALERT ?Critical value received:  WBC 103.5, HGB 6.4. ?Date of notification:  05-25-2021 ?Time of notification: 14:25 pm. ?Critical value read back:  Yes.   ?Nurse who received alert:  B. Jaydee Ingman RN ?MD notified time and response:  [place]. Patient scheduled for 05/27/2021 for 2 units of blood products.   ?

## 2021-05-26 LAB — CBC WITH DIFFERENTIAL/PLATELET
Band Neutrophils: 23 %
Basophils Absolute: 0 10*3/uL (ref 0.0–0.1)
Basophils Relative: 0 %
Blasts: 2 %
Eosinophils Absolute: 0 10*3/uL (ref 0.0–0.5)
Eosinophils Relative: 0 %
HCT: 19.8 % — ABNORMAL LOW (ref 36.0–46.0)
Hemoglobin: 6.4 g/dL — CL (ref 12.0–15.0)
Lymphocytes Relative: 8 %
Lymphs Abs: 8.3 10*3/uL — ABNORMAL HIGH (ref 0.7–4.0)
MCH: 29.8 pg (ref 26.0–34.0)
MCHC: 32.3 g/dL (ref 30.0–36.0)
MCV: 92.1 fL (ref 80.0–100.0)
Metamyelocytes Relative: 2 %
Monocytes Absolute: 12.4 10*3/uL — ABNORMAL HIGH (ref 0.1–1.0)
Monocytes Relative: 12 %
Myelocytes: 7 %
Neutro Abs: 70.4 10*3/uL — ABNORMAL HIGH (ref 1.7–7.7)
Neutrophils Relative %: 45 %
Platelets: 104 10*3/uL — ABNORMAL LOW (ref 150–400)
Promyelocytes Relative: 1 %
RBC: 2.15 MIL/uL — ABNORMAL LOW (ref 3.87–5.11)
RDW: 25 % — ABNORMAL HIGH (ref 11.5–15.5)
WBC: 103.5 10*3/uL (ref 4.0–10.5)
nRBC: 3.1 % — ABNORMAL HIGH (ref 0.0–0.2)

## 2021-05-26 MED ORDER — LANREOTIDE ACETATE 120 MG/0.5ML ~~LOC~~ SOLN
SUBCUTANEOUS | Status: AC
  Filled 2021-05-26: qty 120

## 2021-05-27 ENCOUNTER — Encounter: Payer: Self-pay | Admitting: Family Medicine

## 2021-05-27 ENCOUNTER — Ambulatory Visit (INDEPENDENT_AMBULATORY_CARE_PROVIDER_SITE_OTHER): Payer: Medicare Other | Admitting: Family Medicine

## 2021-05-27 ENCOUNTER — Inpatient Hospital Stay (HOSPITAL_COMMUNITY): Payer: Medicare Other

## 2021-05-27 VITALS — BP 110/76 | HR 64 | Ht 65.0 in | Wt 127.1 lb

## 2021-05-27 DIAGNOSIS — N183 Chronic kidney disease, stage 3 unspecified: Secondary | ICD-10-CM | POA: Diagnosis not present

## 2021-05-27 DIAGNOSIS — D72829 Elevated white blood cell count, unspecified: Secondary | ICD-10-CM

## 2021-05-27 DIAGNOSIS — E790 Hyperuricemia without signs of inflammatory arthritis and tophaceous disease: Secondary | ICD-10-CM | POA: Diagnosis not present

## 2021-05-27 DIAGNOSIS — K2100 Gastro-esophageal reflux disease with esophagitis, without bleeding: Secondary | ICD-10-CM

## 2021-05-27 DIAGNOSIS — I10 Essential (primary) hypertension: Secondary | ICD-10-CM | POA: Diagnosis not present

## 2021-05-27 DIAGNOSIS — L299 Pruritus, unspecified: Secondary | ICD-10-CM

## 2021-05-27 DIAGNOSIS — F418 Other specified anxiety disorders: Secondary | ICD-10-CM | POA: Diagnosis not present

## 2021-05-27 DIAGNOSIS — F5105 Insomnia due to other mental disorder: Secondary | ICD-10-CM | POA: Diagnosis not present

## 2021-05-27 DIAGNOSIS — Z87891 Personal history of nicotine dependence: Secondary | ICD-10-CM | POA: Diagnosis not present

## 2021-05-27 DIAGNOSIS — C946 Myelodysplastic disease, not classified: Secondary | ICD-10-CM | POA: Diagnosis not present

## 2021-05-27 DIAGNOSIS — D471 Chronic myeloproliferative disease: Secondary | ICD-10-CM

## 2021-05-27 DIAGNOSIS — F331 Major depressive disorder, recurrent, moderate: Secondary | ICD-10-CM | POA: Diagnosis not present

## 2021-05-27 DIAGNOSIS — Z79899 Other long term (current) drug therapy: Secondary | ICD-10-CM | POA: Diagnosis not present

## 2021-05-27 DIAGNOSIS — E876 Hypokalemia: Secondary | ICD-10-CM | POA: Diagnosis not present

## 2021-05-27 MED ORDER — CETIRIZINE HCL 10 MG PO TABS: 10.0000 mg | ORAL_TABLET | Freq: Two times a day (BID) | ORAL | 11 refills | Status: DC

## 2021-05-27 MED ORDER — DIPHENHYDRAMINE HCL 25 MG PO CAPS
25.0000 mg | ORAL_CAPSULE | Freq: Once | ORAL | Status: AC
  Administered 2021-05-27: 25 mg via ORAL
  Filled 2021-05-27: qty 1

## 2021-05-27 MED ORDER — ACETAMINOPHEN 325 MG PO TABS
650.0000 mg | ORAL_TABLET | Freq: Once | ORAL | Status: AC
  Administered 2021-05-27: 650 mg via ORAL
  Filled 2021-05-27: qty 2

## 2021-05-27 MED ORDER — HEPARIN SOD (PORK) LOCK FLUSH 100 UNIT/ML IV SOLN
500.0000 [IU] | Freq: Every day | INTRAVENOUS | Status: AC | PRN
  Administered 2021-05-27: 500 [IU]

## 2021-05-27 MED ORDER — SODIUM CHLORIDE 0.9% FLUSH
10.0000 mL | INTRAVENOUS | Status: AC | PRN
  Administered 2021-05-27: 10 mL

## 2021-05-27 MED ORDER — SODIUM CHLORIDE 0.9% IV SOLUTION
250.0000 mL | Freq: Once | INTRAVENOUS | Status: AC
  Administered 2021-05-27: 250 mL via INTRAVENOUS

## 2021-05-27 NOTE — Patient Instructions (Signed)
F/U in 8 weeks , call if you need me before ? ?Listen to your body, if you feel weak or dizzy get blood checked as you may become anemic and need a blood transfusion , which would help a lot ? ?I will reach out to Renee Waters on your behalf and let you know ? ?Good weight gain and improved sleep and mood improved ? ?Thanks for choosing [facility], we consider it a privelige to serve you. ? ?

## 2021-05-27 NOTE — Progress Notes (Signed)
UNMATCHED BLOOD PRODUCT NOTE ? ?Compare the patient ID on the blood tag to the patient ID on the hospital armband and Blood Bank armband. Then confirm the unit number on the blood tag matches the unit number on the blood product.  If a discrepancy is discovered return the product to blood bank immediately. ? ? ?Blood Product Type: Packed Red Blood Cells ? ?Unit #: (Found on blood product bag, begins with W) [id number] ? ?Product Code #: (Found on blood product bag, begins with E) [id number] ? ? ?Start Time: 1153 ? ?Starting Rate: 120 ml/hr ? ?Rate increase/decreased  (if applicable): 744     ml/hr ? ?Rate changed time (if applicable):1208  ? ? ?Stop Time: 1300 ? ? ?All Other Documentation should be documented within the Blood Admin Flowsheet per policy. ? ? ?

## 2021-05-27 NOTE — Patient Instructions (Signed)
[facility]  Discharge Instructions: Thank you for choosing [facility] to provide your oncology and hematology care.  If you have a lab appointment with the Cancer Center, please come in thru the Main Entrance and check in at the main information desk.  Wear comfortable clothing and clothing appropriate for easy access to any Portacath or PICC line.   We strive to give you quality time with your provider. You may need to reschedule your appointment if you arrive late (15 or more minutes).  Arriving late affects you and other patients whose appointments are after yours.  Also, if you miss three or more appointments without notifying the office, you may be dismissed from the clinic at the provider's discretion.      For prescription refill requests, have your pharmacy contact our office and allow 72 hours for refills to be completed.    Today you received the following chemotherapy and/or immunotherapy agents 2UPRBC      To help prevent nausea and vomiting after your treatment, we encourage you to take your nausea medication as directed.  BELOW ARE SYMPTOMS THAT SHOULD BE REPORTED IMMEDIATELY: *FEVER GREATER THAN 100.4 F (38 C) OR HIGHER *CHILLS OR SWEATING *NAUSEA AND VOMITING THAT IS NOT CONTROLLED WITH YOUR NAUSEA MEDICATION *UNUSUAL SHORTNESS OF BREATH *UNUSUAL BRUISING OR BLEEDING *URINARY PROBLEMS (pain or burning when urinating, or frequent urination) *BOWEL PROBLEMS (unusual diarrhea, constipation, pain near the anus) TENDERNESS IN MOUTH AND THROAT WITH OR WITHOUT PRESENCE OF ULCERS (sore throat, sores in mouth, or a toothache) UNUSUAL RASH, SWELLING OR PAIN  UNUSUAL VAGINAL DISCHARGE OR ITCHING   Items with * indicate a potential emergency and should be followed up as soon as possible or go to the Emergency Department if any problems should occur.  Please show the CHEMOTHERAPY ALERT CARD or IMMUNOTHERAPY ALERT CARD at check-in to the Emergency  Department and triage nurse.  Should you have questions after your visit or need to cancel or reschedule your appointment, please contact [facility] [phone]  and follow the prompts.  Office hours are 8:00 a.m. to 4:30 p.m. Monday - Friday. Please note that voicemails left after 4:00 p.m. may not be returned until the following business day.  We are closed weekends and major holidays. You have access to a nurse at all times for urgent questions. Please call the main number to the clinic [phone] and follow the prompts.  For any non-urgent questions, you may also contact your provider using MyChart. We now offer e-Visits for anyone 18 and older to request care online for non-urgent symptoms. For details visit [email or web address].   Also download the MyChart app! Go to the app store, search "MyChart", open the app, select [facility], and log in with your MyChart username and password.  Due to Covid, a mask is required upon entering the hospital/clinic. If you do not have a mask, one will be given to you upon arrival. For doctor visits, patients may have 1 support person aged 18 or older with them. For treatment visits, patients cannot have anyone with them due to current Covid guidelines and our immunocompromised population.  

## 2021-05-27 NOTE — Progress Notes (Signed)
Patient presents today for [facility] per providers order.  BP noted to be 79/49, MD notified.  Per MD, no further interventions needed at this time.   ? ?2UPRBC given today per MD orders.  Stable during infusion without adverse affects.  Vital signs stable.  No complaints at this time.  Discharge from clinic ambulatory in stable condition.  Alert and oriented X 3.  Follow up with [facility] as scheduled.  ?

## 2021-05-28 LAB — TYPE AND SCREEN
ABO/RH(D): O POS
Antibody Screen: POSITIVE
DAT, IgG: NEGATIVE
Donor AG Type: NEGATIVE
Donor AG Type: NEGATIVE
Unit division: 0
Unit division: 0

## 2021-05-28 LAB — BPAM RBC
Blood Product Expiration Date: 202305182359
Blood Product Expiration Date: 202305182359
ISSUE DATE / TIME: 202304201007
ISSUE DATE / TIME: 202304201007
Unit Type and Rh: 5100
Unit Type and Rh: 5100

## 2021-06-06 DIAGNOSIS — I10 Essential (primary) hypertension: Secondary | ICD-10-CM

## 2021-06-07 ENCOUNTER — Encounter: Payer: Self-pay | Admitting: Family Medicine

## 2021-06-07 MED ORDER — MIRTAZAPINE 15 MG PO TABS: 15.0000 mg | ORAL_TABLET | Freq: Every day | ORAL | 5 refills | Status: DC

## 2021-06-07 NOTE — Assessment & Plan Note (Signed)
DASH diet and commitment to daily physical activity for a minimum of 30 minutes discussed and encouraged, as a part of hypertension management. ?The importance of attaining a healthy weight is also discussed. ? ? ?  05/27/2021  ?  1:47 PM 05/27/2021  ?  1:32 PM 05/27/2021  ?  1:00 PM 05/27/2021  ? 12:08 PM 05/27/2021  ? 11:50 AM 05/27/2021  ? 11:31 AM 05/27/2021  ? 10:30 AM  ?BP/Weight  ?Systolic BP 209 470 962 99 100 100 80  ?Diastolic BP 76 77 71 52 65 65 49  ?Wt. (Lbs)  127.12       ?BMI  21.15 kg/m2       ? ? ? ?Controlled, no change in medication ? ?

## 2021-06-07 NOTE — Progress Notes (Signed)
? ?  Renee Waters     MRN: [id number]      DOB: 1945-12-18 ? ? ?HPI ?Renee Waters is here for follow up and re-evaluation of chronic medical conditions, medication management and review of any available recent lab and radiology data.  ?Preventive health is updated, specifically  Cancer screening and Immunization.   ?Just received blood transfusion due to unexplained blood loss ?The PT denies any adverse reactions to current medications since the last visit.  ?still hoping for therapy and requests that I reach out to therapist she has had in the past, does note improvement in mood, sleep and appetite with current meds, less tearful and overwhelmed, but wants and would benefit from therapy ?ROS ?Denies recent fever or chills. ?Denies sinus pressure, nasal congestion, ear pain or sore throat. ?Denies chest congestion, productive cough or wheezing. ?Denies chest pains, palpitations and leg swelling ?Denies abdominal pain, nausea, vomiting,diarrhea or constipation.   ?Denies dysuria, frequency, hesitancy or incontinence. ?Denies uncontrolled joint pain, swelling and limitation in mobility. ?Denies headaches, seizures, numbness, or tingling. ?C/o  depression, anxiety and insomnia.Not suicidal or homicidal ?Denies skin break down or rash. ? ? ?PE ? ?BP 110/76   Pulse 64   Ht '5\' 5"'$  (1.651 m)   Wt 127 lb 1.9 oz (57.7 kg)   SpO2 92%   BMI 21.15 kg/m?  ? ?Patient alert and oriented and in no cardiopulmonary distress. ? ?HEENT: No facial asymmetry, EOMI,     Neck supple . ? ?Chest: Clear to auscultation bilaterally. ? ?CVS: S1, S2 no murmurs, no S3.Regular rate. ? ?ABD: Soft non tender.  ? ?Ext: No edema ? ?MS: decreased ROM spine, shoulders, hips and knees. ? ?Skin: Intact, no ulcerations or rash noted. ? ?Psych: Good eye contact, normal affect. Memory intact mildly  anxious not  depressed appearing. ? ?CNS: CN 2-12 intact, power,  normal throughout.no focal deficits noted. ? ? ?Assessment & Plan ? ?MDD (major depressive  disorder), recurrent episode, moderate ([place]) ?Improved, will benefit from therapy however, will attempt to re establish care with prior therapist, continue current meds ? ?Insomnia secondary to depression with anxiety ?Sleep hygiene reviewed and written information offered also. ?Prescription sent for  medication needed. ? ? ?Essential hypertension ?DASH diet and commitment to daily physical activity for a minimum of 30 minutes discussed and encouraged, as a part of hypertension management. ?The importance of attaining a healthy weight is also discussed. ? ? ?  05/27/2021  ?  1:47 PM 05/27/2021  ?  1:32 PM 05/27/2021  ?  1:00 PM 05/27/2021  ? 12:08 PM 05/27/2021  ? 11:50 AM 05/27/2021  ? 11:31 AM 05/27/2021  ? 10:30 AM  ?BP/Weight  ?Systolic BP 415 830 940 99 100 100 80  ?Diastolic BP 76 77 71 52 65 65 49  ?Wt. (Lbs)  127.12       ?BMI  21.15 kg/m2       ? ? ? ?Controlled, no change in medication ? ? ?MPN (myeloproliferative neoplasm) ([place]) ?curently being actively treated by Oncology, tolerating medication fairly well ? ?Itching ?Has self made appt with allergist, I believe this more likely due to her neoplasm ? ?GERD (gastroesophageal reflux disease) ?Controlled, no change in medication ? ? ?

## 2021-06-07 NOTE — Assessment & Plan Note (Signed)
curently being actively treated by Oncology, tolerating medication fairly well ?

## 2021-06-07 NOTE — Assessment & Plan Note (Signed)
Controlled, no change in medication  

## 2021-06-07 NOTE — Assessment & Plan Note (Signed)
Has self made appt with allergist, I believe this more likely due to her neoplasm ?

## 2021-06-07 NOTE — Assessment & Plan Note (Signed)
Improved, will benefit from therapy however, will attempt to re establish care with prior therapist, continue current meds ?

## 2021-06-07 NOTE — Assessment & Plan Note (Signed)
Sleep hygiene reviewed and written information offered also. Prescription sent for  medication needed.  

## 2021-06-09 ENCOUNTER — Telehealth: Payer: Self-pay | Admitting: Family Medicine

## 2021-06-09 NOTE — Telephone Encounter (Signed)
Patient called in regard to a number she needed from provider, would not specify any further. ?Wants a call back   ?

## 2021-06-10 ENCOUNTER — Inpatient Hospital Stay (HOSPITAL_COMMUNITY): Payer: Medicare Other | Attending: Hematology

## 2021-06-10 ENCOUNTER — Encounter (HOSPITAL_COMMUNITY): Payer: Medicare Other

## 2021-06-10 ENCOUNTER — Telehealth: Payer: Self-pay | Admitting: Family Medicine

## 2021-06-10 DIAGNOSIS — D471 Chronic myeloproliferative disease: Secondary | ICD-10-CM | POA: Insufficient documentation

## 2021-06-10 DIAGNOSIS — Z79899 Other long term (current) drug therapy: Secondary | ICD-10-CM | POA: Insufficient documentation

## 2021-06-10 DIAGNOSIS — R5383 Other fatigue: Secondary | ICD-10-CM | POA: Insufficient documentation

## 2021-06-10 LAB — CBC WITH DIFFERENTIAL/PLATELET
Abs Immature Granulocytes: 33.35 10*3/uL — ABNORMAL HIGH (ref 0.00–0.07)
Basophils Absolute: 0.7 10*3/uL — ABNORMAL HIGH (ref 0.0–0.1)
Basophils Relative: 1 %
Eosinophils Absolute: 0.2 10*3/uL (ref 0.0–0.5)
Eosinophils Relative: 0 %
HCT: 25.9 % — ABNORMAL LOW (ref 36.0–46.0)
Hemoglobin: 8.2 g/dL — ABNORMAL LOW (ref 12.0–15.0)
Immature Granulocytes: 32 %
Lymphocytes Relative: 13 %
Lymphs Abs: 13.6 10*3/uL — ABNORMAL HIGH (ref 0.7–4.0)
MCH: 29.4 pg (ref 26.0–34.0)
MCHC: 31.7 g/dL (ref 30.0–36.0)
MCV: 92.8 fL (ref 80.0–100.0)
Monocytes Absolute: 1.2 10*3/uL — ABNORMAL HIGH (ref 0.1–1.0)
Monocytes Relative: 1 %
Neutro Abs: 56.5 10*3/uL — ABNORMAL HIGH (ref 1.7–7.7)
Neutrophils Relative %: 53 %
Platelets: 106 10*3/uL — ABNORMAL LOW (ref 150–400)
RBC: 2.79 MIL/uL — ABNORMAL LOW (ref 3.87–5.11)
RDW: 21.6 % — ABNORMAL HIGH (ref 11.5–15.5)
WBC: 105.5 10*3/uL (ref 4.0–10.5)
nRBC: 2.6 % — ABNORMAL HIGH (ref 0.0–0.2)

## 2021-06-10 LAB — SAMPLE TO BLOOD BANK

## 2021-06-10 MED ORDER — SODIUM CHLORIDE 0.9% FLUSH
10.0000 mL | Freq: Once | INTRAVENOUS | Status: AC
  Administered 2021-06-10: 10 mL via INTRAVENOUS

## 2021-06-10 MED ORDER — HEPARIN SOD (PORK) LOCK FLUSH 100 UNIT/ML IV SOLN
500.0000 [IU] | Freq: Once | INTRAVENOUS | Status: AC
  Administered 2021-06-10: 500 [IU] via INTRAVENOUS

## 2021-06-10 NOTE — Patient Instructions (Signed)
[facility]  Discharge Instructions: Thank you for choosing [name] to provide your oncology and hematology care.  If you have a lab appointment with the Cancer Center, please come in thru the Main Entrance and check in at the main information desk.  Wear comfortable clothing and clothing appropriate for easy access to any Portacath or PICC line.   We strive to give you quality time with your provider. You may need to reschedule your appointment if you arrive late (15 or more minutes).  Arriving late affects you and other patients whose appointments are after yours.  Also, if you miss three or more appointments without notifying the office, you may be dismissed from the clinic at the provider's discretion.      For prescription refill requests, have your pharmacy contact our office and allow 72 hours for refills to be completed.        To help prevent nausea and vomiting after your treatment, we encourage you to take your nausea medication as directed.  BELOW ARE SYMPTOMS THAT SHOULD BE REPORTED IMMEDIATELY: *FEVER GREATER THAN 100.4 F (38 C) OR HIGHER *CHILLS OR SWEATING *NAUSEA AND VOMITING THAT IS NOT CONTROLLED WITH YOUR NAUSEA MEDICATION *UNUSUAL SHORTNESS OF BREATH *UNUSUAL BRUISING OR BLEEDING *URINARY PROBLEMS (pain or burning when urinating, or frequent urination) *BOWEL PROBLEMS (unusual diarrhea, constipation, pain near the anus) TENDERNESS IN MOUTH AND THROAT WITH OR WITHOUT PRESENCE OF ULCERS (sore throat, sores in mouth, or a toothache) UNUSUAL RASH, SWELLING OR PAIN  UNUSUAL VAGINAL DISCHARGE OR ITCHING   Items with * indicate a potential emergency and should be followed up as soon as possible or go to the Emergency Department if any problems should occur.  Please show the CHEMOTHERAPY ALERT CARD or IMMUNOTHERAPY ALERT CARD at check-in to the Emergency Department and triage nurse.  Should you have questions after your visit or need to cancel  or reschedule your appointment, please contact [facility] [phone]  and follow the prompts.  Office hours are 8:00 a.m. to 4:30 p.m. Monday - Friday. Please note that voicemails left after 4:00 p.m. may not be returned until the following business day.  We are closed weekends and major holidays. You have access to a nurse at all times for urgent questions. Please call the main number to the clinic [phone] and follow the prompts.  For any non-urgent questions, you may also contact your provider using MyChart. We now offer e-Visits for anyone 18 and older to request care online for non-urgent symptoms. For details visit [email or web address].   Also download the MyChart app! Go to the app store, search "MyChart", open the app, select , and log in with your MyChart username and password.  Due to Covid, a mask is required upon entering the hospital/clinic. If you do not have a mask, one will be given to you upon arrival. For doctor visits, patients may have 1 support person aged 18 or older with them. For treatment visits, patients cannot have anyone with them due to current Covid guidelines and our immunocompromised population.  

## 2021-06-10 NOTE — Telephone Encounter (Signed)
Plmomore s let pt know that I have heard back from her former therapist , Kirby Crigler. ? ?He states that if she iswilling to travel to see im in therapy a [facility] where he now works, he will be appy to take care of her as a pt. ? ?Pls let me know if she wants to pursue this and see him in [place], thanks ?

## 2021-06-10 NOTE — Progress Notes (Signed)
Labs drawn from port.  Patients port flushed without difficulty.  Good blood return noted with no bruising or swelling noted at site.  Band aid applied.  VSS with discharge and left in satisfactory condition with no s/s of distress noted.  

## 2021-06-11 ENCOUNTER — Inpatient Hospital Stay (HOSPITAL_COMMUNITY): Payer: Medicare Other

## 2021-06-11 NOTE — Telephone Encounter (Signed)
NO VM.

## 2021-06-14 NOTE — Telephone Encounter (Signed)
I spoke with pt on 5/8 , she does not want to drive to [place] at this time. Doing better, will let Josh know ?

## 2021-06-16 NOTE — Telephone Encounter (Signed)
NO VM.

## 2021-06-17 DIAGNOSIS — M47816 Spondylosis without myelopathy or radiculopathy, lumbar region: Secondary | ICD-10-CM | POA: Diagnosis not present

## 2021-06-17 DIAGNOSIS — G894 Chronic pain syndrome: Secondary | ICD-10-CM | POA: Diagnosis not present

## 2021-06-17 DIAGNOSIS — Z79891 Long term (current) use of opiate analgesic: Secondary | ICD-10-CM | POA: Diagnosis not present

## 2021-06-17 DIAGNOSIS — M47812 Spondylosis without myelopathy or radiculopathy, cervical region: Secondary | ICD-10-CM | POA: Diagnosis not present

## 2021-06-18 ENCOUNTER — Other Ambulatory Visit (HOSPITAL_COMMUNITY): Payer: Self-pay

## 2021-06-18 MED ORDER — LIDOCAINE-PRILOCAINE 2.5-2.5 % EX CREA: 1.0000 "application " | TOPICAL_CREAM | CUTANEOUS | 3 refills | Status: DC | PRN

## 2021-06-18 NOTE — Telephone Encounter (Signed)
Patient called regarding letter received from [employer] cream recall. [place], [place]. There is another manufacturer available. New prescription sent. ?

## 2021-06-24 ENCOUNTER — Inpatient Hospital Stay (HOSPITAL_COMMUNITY): Payer: Medicare Other

## 2021-06-24 ENCOUNTER — Inpatient Hospital Stay (HOSPITAL_BASED_OUTPATIENT_CLINIC_OR_DEPARTMENT_OTHER): Payer: Medicare Other | Admitting: Hematology

## 2021-06-24 DIAGNOSIS — D72829 Elevated white blood cell count, unspecified: Secondary | ICD-10-CM

## 2021-06-24 DIAGNOSIS — R161 Splenomegaly, not elsewhere classified: Secondary | ICD-10-CM

## 2021-06-24 DIAGNOSIS — D471 Chronic myeloproliferative disease: Secondary | ICD-10-CM

## 2021-06-24 DIAGNOSIS — Z79899 Other long term (current) drug therapy: Secondary | ICD-10-CM | POA: Diagnosis not present

## 2021-06-24 DIAGNOSIS — R5383 Other fatigue: Secondary | ICD-10-CM | POA: Diagnosis not present

## 2021-06-24 LAB — LACTATE DEHYDROGENASE: LDH: 720 U/L — ABNORMAL HIGH (ref 98–192)

## 2021-06-24 LAB — COMPREHENSIVE METABOLIC PANEL
ALT: 16 U/L (ref 0–44)
AST: 35 U/L (ref 15–41)
Albumin: 3.8 g/dL (ref 3.5–5.0)
Alkaline Phosphatase: 155 U/L — ABNORMAL HIGH (ref 38–126)
Anion gap: 5 (ref 5–15)
BUN: 36 mg/dL — ABNORMAL HIGH (ref 8–23)
CO2: 24 mmol/L (ref 22–32)
Calcium: 7.9 mg/dL — ABNORMAL LOW (ref 8.9–10.3)
Chloride: 109 mmol/L (ref 98–111)
Creatinine, Ser: 1.69 mg/dL — ABNORMAL HIGH (ref 0.44–1.00)
GFR, Estimated: 31 mL/min — ABNORMAL LOW (ref >60)
Glucose, Bld: 89 mg/dL (ref 70–99)
Potassium: 4.5 mmol/L (ref 3.5–5.1)
Sodium: 138 mmol/L (ref 135–145)
Total Bilirubin: 0.5 mg/dL (ref 0.3–1.2)
Total Protein: 7.1 g/dL (ref 6.5–8.1)

## 2021-06-24 LAB — CBC WITH DIFFERENTIAL/PLATELET
Band Neutrophils: 4 %
Basophils Relative: 0 %
Blasts: 5 %
Eosinophils Relative: 0 %
HCT: 22 % — ABNORMAL LOW (ref 36.0–46.0)
Hemoglobin: 7.1 g/dL — ABNORMAL LOW (ref 12.0–15.0)
Lymphocytes Relative: 20 %
MCH: 29.3 pg (ref 26.0–34.0)
MCHC: 32.3 g/dL (ref 30.0–36.0)
MCV: 90.9 fL (ref 80.0–100.0)
Metamyelocytes Relative: 2 %
Monocytes Relative: 6 %
Myelocytes: 9 %
Neutrophils Relative %: 52 %
Platelets: 75 10*3/uL — ABNORMAL LOW (ref 150–400)
Promyelocytes Relative: 2 %
RBC: 2.42 MIL/uL — ABNORMAL LOW (ref 3.87–5.11)
RDW: 21.4 % — ABNORMAL HIGH (ref 11.5–15.5)
WBC: 99.7 10*3/uL (ref 4.0–10.5)
nRBC: 2.7 % — ABNORMAL HIGH (ref 0.0–0.2)
nRBC: 7 /100 WBC — ABNORMAL HIGH

## 2021-06-24 LAB — SAMPLE TO BLOOD BANK

## 2021-06-24 LAB — PREPARE RBC (CROSSMATCH)

## 2021-06-24 LAB — PHOSPHORUS: Phosphorus: 5 mg/dL — ABNORMAL HIGH (ref 2.5–4.6)

## 2021-06-24 LAB — URIC ACID: Uric Acid, Serum: 3.9 mg/dL (ref 2.5–7.1)

## 2021-06-24 LAB — MAGNESIUM: Magnesium: 2.1 mg/dL (ref 1.7–2.4)

## 2021-06-24 MED ORDER — HEPARIN SOD (PORK) LOCK FLUSH 100 UNIT/ML IV SOLN
500.0000 [IU] | Freq: Once | INTRAVENOUS | Status: AC
  Administered 2021-06-24: 500 [IU] via INTRAVENOUS

## 2021-06-24 MED ORDER — SODIUM CHLORIDE 0.9% FLUSH
10.0000 mL | INTRAVENOUS | Status: DC | PRN
Start: 2021-06-24 — End: 2021-06-24
  Administered 2021-06-24: 10 mL via INTRAVENOUS

## 2021-06-24 NOTE — Progress Notes (Signed)
Patient is taking Jakafi as prescribed.  She has not missed any doses and reports no side effects at this time.

## 2021-06-24 NOTE — Patient Instructions (Addendum)
[place] at [facility] Discharge Instructions   You were seen and examined today by [name].  He reviewed your blood work. Your hemoglobin is 7.1. We will arrange for you to have 1 unit of blood tomorrow. Your white count is elevated at 99, but has improved from last time.   Continue Jakafi as prescribed.   Return as scheduled.      Thank you for choosing [place] at [facility] to provide your oncology and hematology care.  To afford each patient quality time with our provider, please arrive at least 15 minutes before your scheduled appointment time.   If you have a lab appointment with the [facility] please come in thru the Main Entrance and check in at the main information desk.  You need to re-schedule your appointment should you arrive 10 or more minutes late.  We strive to give you quality time with our providers, and arriving late affects you and other patients whose appointments are after yours.  Also, if you no show three or more times for appointments you may be dismissed from the clinic at the providers discretion.     Again, thank you for choosing [facility].  Our hope is that these requests will decrease the amount of time that you wait before being seen by our physicians.       _____________________________________________________________  Should you have questions after your visit to [facility], please contact our office at [phone] and follow the prompts.  Our office hours are 8:00 a.m. and 4:30 p.m. Monday - Friday.  Please note that voicemails left after 4:00 p.m. may not be returned until the following business day.  We are closed weekends and major holidays.  You do have access to a nurse 24-7, just call the main number to the clinic [phone] and do not press any options, hold on the line and a nurse will answer the phone.    For prescription refill requests, have your  pharmacy contact our office and allow 72 hours.    Due to Covid, you will need to wear a mask upon entering the hospital. If you do not have a mask, a mask will be given to you at the Main Entrance upon arrival. For doctor visits, patients may have 1 support person age 71 or older with them. For treatment visits, patients can not have anyone with them due to social distancing guidelines and our immunocompromised population.

## 2021-06-24 NOTE — Progress Notes (Signed)
CRITICAL VALUE ALERT Critical value received:  WBC 99.7 Date of notification:  06-24-21 Time of notification: 1152 Critical value read back:  Yes.   Nurse who received alert:  C. Melburn Treiber RN MD notified time and response:  1155, will give one unit of blood tomorrow per MD.

## 2021-06-24 NOTE — Progress Notes (Signed)
[place],  [id number]   CLINIC:  Medical Oncology/Hematology  PCP:  [name], MD [address] [place] / [place] [address]  [phone]  REASON FOR VISIT:  Follow-up for MPL positive myeloproliferative neoplasm  PRIOR THERAPY: none  CURRENT THERAPY: Jakafi 10 mg twice daily  INTERVAL HISTORY:  [name], a 76 y.o. female, returns for routine follow-up for her MPL positive myeloproliferative neoplasm. Renee Waters was last seen on 05/25/2021.  Today she reports feeling good, and she is accompanied by her husband. She has gained 3 lbs since her last visit. She continues to have generalized itching. She reports she has black stools for several days, but her stools are now dark brown. She denies night sweats.   REVIEW OF SYSTEMS:  Review of Systems  Constitutional:  Negative for appetite change, fatigue and unexpected weight change.  Gastrointestinal:        Black stools  Musculoskeletal:  Positive for arthralgias (3/10 hip and knee) and back pain (3/10).  Skin:  Positive for itching.  Neurological:  Positive for headaches.  All other systems reviewed and are negative.  PAST MEDICAL/SURGICAL HISTORY:  Past Medical History:  Diagnosis Date   Acute cholangitis    Allergy    Anemia    Anxiety    Arthritis    Phreesia 03/18/2020   Barrett's esophagus    Cataract    Chronic back pain    Chronic neck pain    CKD (chronic kidney disease) stage 3, GFR 30-59 ml/min ([place]) 10/29/2020   Depression    Genital herpes    GERD (gastroesophageal reflux disease)    H/O degenerative disc disease    History of blood transfusion    Hypertension    Insomnia    Lupus (systemic lupus erythematosus) ([place])    Neuromuscular disorder ([place])    Osteoarthritis    S/P colonoscopy June 2005   normal, no polyps   S/P endoscopy June 2005, Oct 2009   2005: short-segment Barrett's, 2009: short-segment Barrett's   Upper  respiratory tract infection 07/09/2020   UTI (lower urinary tract infection) 11/2012   Past Surgical History:  Procedure Laterality Date   ABDOMINAL HYSTERECTOMY     BACK SURGERY     BIOPSY N/A 03/20/2014   Procedure: BIOPSY;  Surgeon: Daneil Dolin, MD;  Location: [facility];  Service: Endoscopy;  Laterality: N/A;   BIOPSY  09/14/2015   Procedure: BIOPSY;  Surgeon: Daneil Dolin, MD;  Location: [facility];  Service: Endoscopy;;  esophageal and gastric   BIOPSY  07/23/2019   Procedure: BIOPSY;  Surgeon: Daneil Dolin, MD;  Location: [facility];  Service: Endoscopy;;  esophageal    CARPAL TUNNEL RELEASE Left 2013   cervical disectomy  2002   CESAREAN SECTION N/A    Phreesia 03/18/2020   CHOLECYSTECTOMY     with lysis of adhesions for sbo; "ruptured gallbladder".   COLONOSCOPY  11/09/2011   RMR: Melanosis coli   COLONOSCOPY WITH PROPOFOL N/A 09/14/2015   Dr. Gala Romney: diverticulosis, 82m TA removed. next TCS 09/2020.    COLONOSCOPY WITH PROPOFOL N/A 04/30/2020   Procedure: COLONOSCOPY WITH PROPOFOL;  Surgeon: RDaneil Dolin MD;  Location: [facility];  Service: Endoscopy;  Laterality: N/A;  PM (ASA 3)   DENTAL SURGERY  11/2015   multiple tooth extraction   ESOPHAGOGASTRODUODENOSCOPY  11/29/2007   salmon-colored  tongue   longest stable at  3 cm, distal  esophagus as described previously status post biopsy/ Hiatal hernia, otherwise normal stomach D1 and D2   ESOPHAGOGASTRODUODENOSCOPY  01/06/11   short segment Barrett's esophagus s/p bx/Hiatal hernia   ESOPHAGOGASTRODUODENOSCOPY (EGD) WITH PROPOFOL N/A 03/20/2014   EUM:PNTIRWER distal esophagus short segment barrett's, bx with no dysplasia. next egd in 03/2017   ESOPHAGOGASTRODUODENOSCOPY (EGD) WITH PROPOFOL N/A 09/14/2015   Dr. Gala Romney: Barrett's without dysplasia, gastritis benign bx, hiatal hernia. next EGD 09/2018.   ESOPHAGOGASTRODUODENOSCOPY (EGD) WITH PROPOFOL N/A 07/23/2019   Procedure: ESOPHAGOGASTRODUODENOSCOPY (EGD) WITH PROPOFOL;   Surgeon: Daneil Dolin, MD;  [facility];  Service: Endoscopy;  Laterality: N/A;  3:00pm   EYE SURGERY N/A    Phreesia 03/18/2020   HERNIA REPAIR Right 07/2010   Dr. Zada Girt   IR IMAGING GUIDED PORT INSERTION  02/09/2021   JOINT REPLACEMENT     LAPAROSCOPIC CHOLECYSTECTOMY  2017   at [facility]   POLYPECTOMY  09/14/2015   Procedure: POLYPECTOMY;  Surgeon: Daneil Dolin, MD;  Location: [facility];  Service: Endoscopy;;  ascending colon   right hip replacement  07/2010   went back in sept 2012 to fix   SHOULDER ARTHROSCOPY  2008   left   SPINE SURGERY N/A    Phreesia 03/18/2020   TOTAL HIP REVISION Right 12/17/2012   Procedure: RIGHT TOTAL HIP REVISION;  Surgeon: Mauri Pole, MD;  Location: [facility];  Service: Orthopedics;  Laterality: Right;   WRIST SURGERY Right 2011   open reduction right wrist.    SOCIAL HISTORY:  Social History   Socioeconomic History   Marital status: Married    Spouse name: louis   Number of children: 4   Years of education: 12+   Highest education level: Some college, no degree  Occupational History   Occupation: Press photographer  - retired   Occupation: non profit  Tobacco Use   Smoking status: Former    Packs/day: 0.25    Years: 25.00    Pack years: 6.25    Types: Cigarettes    Quit date: 02/07/2003    Years since quitting: 18.3   Smokeless tobacco: Never   Tobacco comments:    quit in 2004  Vaping Use   Vaping Use: Never used  Substance and Sexual Activity   Alcohol use: No   Drug use: No   Sexual activity: Not Currently    Birth control/protection: Surgical  Other Topics Concern   Not on file  Social History Narrative   Not on file   Social Determinants of Health   Financial Resource Strain: Low Risk    Difficulty of Paying Living Expenses: Not hard at all  Food Insecurity: No Food Insecurity   Worried About Charity fundraiser in the Last Year: Never true   [place] in the Last Year: Never true  Transportation  Needs: No Transportation Needs   Lack of Transportation (Medical): No   Lack of Transportation (Non-Medical): No  Physical Activity: Inactive   Days of Exercise per Week: 0 days   Minutes of Exercise per Session: 0 min  Stress: Stress Concern Present   Feeling of Stress : Rather much  Social Connections: Moderately Integrated   Frequency of Communication with Friends and Family: More than three times a week   Frequency of Social Gatherings with Friends and Family: Once a week   Attends Religious Services: More than 4 times per year   Active Member of [employer] or Organizations: No   Attends Club or  Organization Meetings: Never   Marital Status: Married  Human resources officer Violence: Not At Risk   Fear of Current or Ex-Partner: No   Emotionally Abused: No   Physically Abused: No   Sexually Abused: No    FAMILY HISTORY:  Family History  Problem Relation Age of Onset   Hypertension Mother    Stroke Mother    Colon cancer Neg Hx    Anesthesia problems Neg Hx    Hypotension Neg Hx    Malignant hyperthermia Neg Hx    Pseudochol deficiency Neg Hx    Gastric cancer Neg Hx    Esophageal cancer Neg Hx     CURRENT MEDICATIONS:  Current Outpatient Medications  Medication Sig Dispense Refill   acyclovir (ZOVIRAX) 800 MG tablet Take 1 tablet (800 mg total) by mouth 5 (five) times daily. 50 tablet 1   acyclovir ointment (ZOVIRAX) 5 % Apply 1 application. topically every 3 (three) hours. 15 g 1   allopurinol (ZYLOPRIM) 300 MG tablet Take 1 tablet by mouth once daily 30 tablet 0   ALPRAZolam (XANAX) 0.5 MG tablet Take 1 tablet (0.5 mg total) by mouth at bedtime. 30 tablet 5   cetirizine (ZYRTEC) 10 MG tablet Take 1 tablet (10 mg total) by mouth 2 (two) times daily. 60 tablet 11   citalopram (CELEXA) 20 MG tablet Take 1 tablet (20 mg total) by mouth daily. 30 tablet 3   EPINEPHRINE 0.3 mg/0.3 mL IJ SOAJ injection INJECT 0.3 MLS INTO MUSCLE ONCE AS NEEDED 1 Device 2   gabapentin (NEURONTIN) 300  MG capsule Take one capsule by mouth once daily, as needed, for pain 90 capsule 1   hydrochlorothiazide (HYDRODIURIL) 25 MG tablet Take 1 tablet by mouth once daily 90 tablet 0   losartan (COZAAR) 25 MG tablet Take 1 tablet by mouth once daily for blood pressure 30 tablet 5   meloxicam (MOBIC) 7.5 MG tablet Take 1 tablet (7.5 mg total) by mouth daily as needed for pain (knee pain). 30 tablet 5   mirtazapine (REMERON) 15 MG tablet TAKE 1 TABLET BY MOUTH AT BEDTIME 30 tablet 0   mirtazapine (REMERON) 15 MG tablet Take 1 tablet (15 mg total) by mouth at bedtime. 30 tablet 5   oxyCODONE (OXY IR/ROXICODONE) 5 MG immediate release tablet Take 5 mg by mouth every 4 (four) hours as needed.     pantoprazole (PROTONIX) 40 MG tablet TAKE 1 TABLET BY MOUTH ONCE DAILY BEFORE BREAKFAST 90 tablet 3   potassium chloride SA (KLOR-CON M) 10 MEQ tablet Take 1 tablet (10 mEq total) by mouth daily. 30 tablet 3   Probiotic Product (EQL DAILY PROBIOTIC PO) Take 1 capsule by mouth daily.     ruxolitinib phosphate (JAKAFI) 15 MG tablet Take 1 tablet (15 mg total) by mouth 2 (two) times daily. 60 tablet 3   temazepam (RESTORIL) 30 MG capsule Take 1 capsule (30 mg total) by mouth at bedtime as needed for sleep. 30 capsule 5   tiZANidine (ZANAFLEX) 4 MG tablet Take 8 mg by mouth 3 (three) times daily as needed (spasms). 180 tablet 0   lidocaine-prilocaine (EMLA) cream Apply 1 application. topically as needed. (Patient not taking: Reported on 06/24/2021) 30 g 3   No current facility-administered medications for this visit.   Facility-Administered Medications Ordered in Other Visits  Medication Dose Route Frequency Provider Last Rate Last Admin   lanreotide acetate (SOMATULINE DEPOT) 120 MG/0.5ML injection            octreotide (  SANDOSTATIN LAR) 30 MG IM injection            sodium chloride flush (NS) 0.9 % injection 10 mL  10 mL Intravenous PRN Derek Jack, MD   10 mL at 06/24/21 1121    ALLERGIES:  Allergies   Allergen Reactions   Bee Venom Swelling and Hives   Acetaminophen     itches   Amlodipine     Hair loss   Tyloxapol    Other Itching, Rash and Swelling   Oxycodone-Acetaminophen Rash    Pt states, "this gives her a rash, but at home she takes oxycodone for pain relief" Pt states, "this gives her a rash, but at home she takes oxycodone for pain relief"    PHYSICAL EXAM:  Performance status (ECOG): 1 - Symptomatic but completely ambulatory  There were no vitals filed for this visit. Wt Readings from Last 3 Encounters:  06/24/21 130 lb 4.8 oz (59.1 kg)  05/27/21 127 lb 1.9 oz (57.7 kg)  05/25/21 123 lb 6.4 oz (56 kg)   Physical Exam Vitals reviewed.  Constitutional:      Appearance: Normal appearance.  Cardiovascular:     Rate and Rhythm: Normal rate and regular rhythm.     Pulses: Normal pulses.     Heart sounds: Normal heart sounds.  Pulmonary:     Effort: Pulmonary effort is normal.     Breath sounds: Normal breath sounds.  Abdominal:     Palpations: Abdomen is soft. There is hepatomegaly (2 fingerbreaths below costal margin) and splenomegaly (3 fingerbreadths below costal margin). There is no mass.     Tenderness: There is abdominal tenderness.  Musculoskeletal:     Right lower leg: No edema.     Left lower leg: No edema.  Neurological:     General: No focal deficit present.     Mental Status: She is alert and oriented to person, place, and time.  Psychiatric:        Mood and Affect: Mood normal.        Behavior: Behavior normal.    LABORATORY DATA:  I have reviewed the labs as listed.     Latest Ref Rng & Units 06/10/2021   10:23 AM 05/25/2021    1:20 PM 05/04/2021   12:54 PM  CBC  WBC 4.0 - 10.5 K/uL 105.5   103.5   115.2    Hemoglobin 12.0 - 15.0 g/dL 8.2   6.4   7.7    Hematocrit 36.0 - 46.0 % 25.9   19.8   24.2    Platelets 150 - 400 K/uL 106   104   189        Latest Ref Rng & Units 06/24/2021   10:27 AM 05/25/2021    1:20 PM 05/04/2021   12:36 PM   CMP  Glucose 70 - 99 mg/dL 89   102   116    BUN 8 - 23 mg/dL 36   31   31    Creatinine 0.44 - 1.00 mg/dL 1.69   1.33   1.48    Sodium 135 - 145 mmol/L 138   135   140    Potassium 3.5 - 5.1 mmol/L 4.5   3.8   3.4    Chloride 98 - 111 mmol/L 109   107   108    CO2 22 - 32 mmol/L _0 Calcium 8.9 - 10.3 mg/dL 7.9   8.2  8.2    Total Protein 6.5 - 8.1 g/dL 7.1   7.2   7.4    Total Bilirubin 0.3 - 1.2 mg/dL 0.5   0.5   0.5    Alkaline Phos 38 - 126 U/L 155   149   150    AST 15 - 41 U/L 35   38   46    ALT 0 - 44 U/L _0 Component Value Date/Time   RBC 2.79 (L) 06/10/2021 1023   MCV 92.8 06/10/2021 1023   MCV 91 05/27/2020 1632   MCH 29.4 06/10/2021 1023   MCHC 31.7 06/10/2021 1023   RDW 21.6 (H) 06/10/2021 1023   RDW 14.1 05/27/2020 1632   LYMPHSABS 13.6 (H) 06/10/2021 1023   LYMPHSABS 1.7 05/27/2020 1632   MONOABS 1.2 (H) 06/10/2021 1023   EOSABS 0.2 06/10/2021 1023   EOSABS 0.2 05/27/2020 1632   BASOSABS 0.7 (H) 06/10/2021 1023   BASOSABS 0.0 05/27/2020 1632    DIAGNOSTIC IMAGING:  I have independently reviewed the scans and discussed with the patient. No results found.   ASSESSMENT:  Leukocytosis with left shift: - CBC on 11/06/2020 with white count 75.6, differential 59% neutrophils, 12% monocytes, 4% lymphocytes, 1 percentage of eosinophils and basophils, 6% band neutrophils, 12% myelocytes, 1% promyelocytes, 29% blasts. - Pathologist review of blood smear reported as leukoerythroblastic reaction. - 25 pound weight loss in the last 6 months, unintentional.  Decreased appetite.  Reports fatigue for the last few months.  Reports night sweats x3 in the last 1 month. - Bone marrow biopsy on 11/18/2020 consistent with granulocytic proliferation with differential including CMML versus myeloproliferative disorder. - BCR/ABL was negative. 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 - NGS myeloid panel shows mutations in ASXL1,  MPL, TET2, EZH2 mutations. - Spleen ultrasound on 12/16/2020 shows mildly enlarged measuring 12.4 x 9.6 x 7 cm with volume of 435 cc. - PDGFR alpha, beta and FGFR 1 was negative. - She was evaluated by Dr. Florene Glen at [facility].  Slides were reviewed at [facility] hematopathology.  They thought it was less likely CMML and more likely MPL positive myeloproliferative neoplasm.  Dr. Florene Glen has recommended initiate Jakafi. - Ruxolitinib 10 mg twice daily started on 02/16/2021.  Dose increased to 15 mg twice daily on 05/18/2021. - CTAP on 04/07/2021: Hepatomegaly and splenomegaly measuring 14.3 cm.  No other pathology.  No spleen infarcts.  2.  Social/family history: - She lives at home and is able to do all her ADLs and IADLs although she is getting tired lately.  She reports quitting smoking 6 months ago and smoked 1 pack/week for 43 years. - She believes that her mother had some kind of leukemia.  No other malignancies.   PLAN:  MPL positive myeloproliferative neoplasm: - She is taking ruxolitinib 15 mg twice daily which was increased on 05/18/2021. - Physical examination shows splenomegaly 2 fingerbreadths below the left costal margin and hepatomegaly 3 fingerbreadths below the right costal margin. - Reviewed labs today which showed normal LFTs with elevated alkaline phosphatase.  CBC shows white count 99.7, improved from 105.  Platelet count is down to 75 down from 106.  Hemoglobin is 7.1. - Recommend 1 unit PRBC. - She plans to take a vacation for a week in June.  We will check CBC and transfuse as needed.  RTC 6 weeks for follow-up.  Continue same dose of  ruxolitinib.  We will plan to repeat spleen ultrasound prior to next visit along with labs.  2.  Decreased appetite: - She has gained 3 pounds from last visit and is eating multiple small meals per day.  3.  CKD: - Baseline creatinine between 1.3-1.5.  Today it has increased to 1.69.  Increase water intake.  4.  Hyperuricemia: -  Continue allopurinol 300 mg daily.  Uric acid is normal.  5.  Hypokalemia: - Continue potassium 10 mEq daily.  Orders placed this encounter:  No orders of the defined types were placed in this encounter.    Derek Jack, MD [place] [phone]   I, Thana Ates, am acting as a scribe for Dr. Derek Jack.  I, Derek Jack MD, have reviewed the above documentation for accuracy and completeness, and I agree with the above.

## 2021-06-25 ENCOUNTER — Inpatient Hospital Stay (HOSPITAL_COMMUNITY): Payer: Medicare Other

## 2021-06-25 ENCOUNTER — Encounter (HOSPITAL_COMMUNITY): Payer: Self-pay

## 2021-06-25 DIAGNOSIS — R5383 Other fatigue: Secondary | ICD-10-CM | POA: Diagnosis not present

## 2021-06-25 DIAGNOSIS — D471 Chronic myeloproliferative disease: Secondary | ICD-10-CM

## 2021-06-25 DIAGNOSIS — Z79899 Other long term (current) drug therapy: Secondary | ICD-10-CM | POA: Diagnosis not present

## 2021-06-25 MED ORDER — HEPARIN SOD (PORK) LOCK FLUSH 100 UNIT/ML IV SOLN
500.0000 [IU] | Freq: Every day | INTRAVENOUS | Status: AC | PRN
  Administered 2021-06-25: 500 [IU]

## 2021-06-25 MED ORDER — ACETAMINOPHEN 325 MG PO TABS
650.0000 mg | ORAL_TABLET | Freq: Once | ORAL | Status: AC
  Administered 2021-06-25: 650 mg via ORAL
  Filled 2021-06-25: qty 2

## 2021-06-25 MED ORDER — DIPHENHYDRAMINE HCL 25 MG PO CAPS
25.0000 mg | ORAL_CAPSULE | Freq: Once | ORAL | Status: AC
  Administered 2021-06-25: 25 mg via ORAL
  Filled 2021-06-25: qty 1

## 2021-06-25 MED ORDER — SODIUM CHLORIDE 0.9% FLUSH
10.0000 mL | INTRAVENOUS | Status: AC | PRN
  Administered 2021-06-25: 10 mL

## 2021-06-25 MED ORDER — SODIUM CHLORIDE 0.9% IV SOLUTION
250.0000 mL | Freq: Once | INTRAVENOUS | Status: AC
  Administered 2021-06-25: 250 mL via INTRAVENOUS

## 2021-06-25 NOTE — Progress Notes (Signed)
1 unit of blood given today per MD orders. Tolerated infusion without adverse affects. Vital signs stable. No complaints at this time. Discharged from clinic ambulatory in stable condition. Alert and oriented x 3. F/U with [facility] as scheduled.    

## 2021-06-25 NOTE — Progress Notes (Signed)
UNMATCHED BLOOD PRODUCT NOTE  Compare the patient ID on the blood tag to the patient ID on the hospital armband and Blood Bank armband. Then confirm the unit number on the blood tag matches the unit number on the blood product.  If a discrepancy is discovered return the product to blood bank immediately.   Blood Product Type: Packed Red Blood Cells  Unit #: (Found on blood product bag, begins with W) [id number]  Product Code #: (Found on blood product bag, begins with E) [id number]   Start Time: 0925  Starting Rate: 120 ml/hr  Rate increase/decreased 225  Rate changed time (if applicable): 9295   Stop Time: 1123   All Other Documentation should be documented within the Blood Admin Flowsheet per policy.

## 2021-06-25 NOTE — Patient Instructions (Signed)
[facility]  Discharge Instructions: Thank you for choosing [facility] to provide your oncology and hematology care.  If you have a lab appointment with the [facility], please come in thru the Main Entrance and check in at the main information desk.  Wear comfortable clothing and clothing appropriate for easy access to any Portacath or PICC line.   We strive to give you quality time with your provider. You may need to reschedule your appointment if you arrive late (15 or more minutes).  Arriving late affects you and other patients whose appointments are after yours.  Also, if you miss three or more appointments without notifying the office, you may be dismissed from the clinic at the provider's discretion.      For prescription refill requests, have your pharmacy contact our office and allow 72 hours for refills to be completed.    Today you received the following chemotherapy and/or immunotherapy agents 1 unit of blood.       To help prevent nausea and vomiting after your treatment, we encourage you to take your nausea medication as directed.  BELOW ARE SYMPTOMS THAT SHOULD BE REPORTED IMMEDIATELY: *FEVER GREATER THAN 100.4 F (38 C) OR HIGHER *CHILLS OR SWEATING *NAUSEA AND VOMITING THAT IS NOT CONTROLLED WITH YOUR NAUSEA MEDICATION *UNUSUAL SHORTNESS OF BREATH *UNUSUAL BRUISING OR BLEEDING *URINARY PROBLEMS (pain or burning when urinating, or frequent urination) *BOWEL PROBLEMS (unusual diarrhea, constipation, pain near the anus) TENDERNESS IN MOUTH AND THROAT WITH OR WITHOUT PRESENCE OF ULCERS (sore throat, sores in mouth, or a toothache) UNUSUAL RASH, SWELLING OR PAIN  UNUSUAL VAGINAL DISCHARGE OR ITCHING   Items with * indicate a potential emergency and should be followed up as soon as possible or go to the Emergency Department if any problems should occur.  Please show the CHEMOTHERAPY ALERT CARD or IMMUNOTHERAPY ALERT CARD at check-in to the  Emergency Department and triage nurse.  Should you have questions after your visit or need to cancel or reschedule your appointment, please contact [facility] [phone]  and follow the prompts.  Office hours are 8:00 a.m. to 4:30 p.m. Monday - Friday. Please note that voicemails left after 4:00 p.m. may not be returned until the following business day.  We are closed weekends and major holidays. You have access to a nurse at all times for urgent questions. Please call the main number to the clinic [phone] and follow the prompts.  For any non-urgent questions, you may also contact your provider using MyChart. We now offer e-Visits for anyone 65 and older to request care online for non-urgent symptoms. For details visit [email or web address].   Also download the MyChart app! Go to the app store, search "MyChart", open the app, select [facility], and log in with your MyChart username and password.  Due to Covid, a mask is required upon entering the hospital/clinic. If you do not have a mask, one will be given to you upon arrival. For doctor visits, patients may have 1 support person aged 64 or older with them. For treatment visits, patients cannot have anyone with them due to current Covid guidelines and our immunocompromised population.

## 2021-06-26 ENCOUNTER — Other Ambulatory Visit: Payer: Self-pay | Admitting: Family Medicine

## 2021-06-26 ENCOUNTER — Other Ambulatory Visit (HOSPITAL_COMMUNITY): Payer: Self-pay | Admitting: Hematology

## 2021-06-28 ENCOUNTER — Other Ambulatory Visit (HOSPITAL_COMMUNITY): Payer: Self-pay | Admitting: *Deleted

## 2021-06-28 LAB — BPAM RBC
Blood Product Expiration Date: 202306072359
ISSUE DATE / TIME: 202305190920
Unit Type and Rh: 5100

## 2021-06-28 LAB — TYPE AND SCREEN
ABO/RH(D): O POS
Antibody Screen: POSITIVE
DAT, IgG: NEGATIVE
Donor AG Type: NEGATIVE
Donor AG Type: NEGATIVE
Unit division: 0
Unit division: 0

## 2021-06-30 ENCOUNTER — Ambulatory Visit (HOSPITAL_COMMUNITY): Admission: RE | Admit: 2021-06-30 | Payer: Medicare Other | Source: Ambulatory Visit

## 2021-06-30 ENCOUNTER — Ambulatory Visit: Payer: Self-pay | Admitting: Allergy & Immunology

## 2021-07-06 ENCOUNTER — Other Ambulatory Visit (HOSPITAL_COMMUNITY): Payer: Self-pay

## 2021-07-13 ENCOUNTER — Encounter (HOSPITAL_COMMUNITY): Payer: Self-pay

## 2021-07-13 NOTE — Progress Notes (Signed)
Call received from patient that on her right arm, below where she used to regularly have lab draws, she has noticed a black/blue bruise that is sore with two raised dime-sized areas. Patient states that she would like this assessed prior to leaving for [place]. Patient scheduled for PF/lab on Thursday, July 15, 2021. Patient to ask RN to assess at that time and determine the need for provider assessment on that day. Patient agreeable to the plan.

## 2021-07-15 ENCOUNTER — Inpatient Hospital Stay (HOSPITAL_COMMUNITY): Payer: Medicare Other | Attending: Hematology

## 2021-07-15 ENCOUNTER — Ambulatory Visit (HOSPITAL_COMMUNITY)
Admission: RE | Admit: 2021-07-15 | Discharge: 2021-07-15 | Disposition: A | Discharge status: Home / Self Care | Payer: Medicare Other | Source: Ambulatory Visit | Attending: Hematology | Admitting: Hematology

## 2021-07-15 ENCOUNTER — Other Ambulatory Visit (HOSPITAL_COMMUNITY): Payer: Self-pay

## 2021-07-15 DIAGNOSIS — Z79899 Other long term (current) drug therapy: Secondary | ICD-10-CM | POA: Diagnosis not present

## 2021-07-15 DIAGNOSIS — R109 Unspecified abdominal pain: Secondary | ICD-10-CM | POA: Insufficient documentation

## 2021-07-15 DIAGNOSIS — K5900 Constipation, unspecified: Secondary | ICD-10-CM | POA: Diagnosis not present

## 2021-07-15 DIAGNOSIS — M791 Myalgia, unspecified site: Secondary | ICD-10-CM | POA: Diagnosis not present

## 2021-07-15 DIAGNOSIS — C946 Myelodysplastic disease, not classified: Secondary | ICD-10-CM | POA: Insufficient documentation

## 2021-07-15 DIAGNOSIS — I809 Phlebitis and thrombophlebitis of unspecified site: Secondary | ICD-10-CM | POA: Diagnosis not present

## 2021-07-15 DIAGNOSIS — M7989 Other specified soft tissue disorders: Secondary | ICD-10-CM | POA: Diagnosis not present

## 2021-07-15 DIAGNOSIS — M79621 Pain in right upper arm: Secondary | ICD-10-CM | POA: Insufficient documentation

## 2021-07-15 DIAGNOSIS — R634 Abnormal weight loss: Secondary | ICD-10-CM | POA: Insufficient documentation

## 2021-07-15 DIAGNOSIS — R162 Hepatomegaly with splenomegaly, not elsewhere classified: Secondary | ICD-10-CM | POA: Diagnosis not present

## 2021-07-15 DIAGNOSIS — D471 Chronic myeloproliferative disease: Secondary | ICD-10-CM

## 2021-07-15 LAB — CBC WITH DIFFERENTIAL/PLATELET
Band Neutrophils: 7 %
Basophils Absolute: 0 10*3/uL (ref 0.0–0.1)
Basophils Relative: 0 %
Blasts: 6 %
Eosinophils Absolute: 0 10*3/uL (ref 0.0–0.5)
Eosinophils Relative: 0 %
HCT: 23.5 % — ABNORMAL LOW (ref 36.0–46.0)
Hemoglobin: 7.6 g/dL — ABNORMAL LOW (ref 12.0–15.0)
Lymphocytes Relative: 7 %
Lymphs Abs: 6.9 10*3/uL — ABNORMAL HIGH (ref 0.7–4.0)
MCH: 29.5 pg (ref 26.0–34.0)
MCHC: 32.3 g/dL (ref 30.0–36.0)
MCV: 91.1 fL (ref 80.0–100.0)
Metamyelocytes Relative: 4 %
Monocytes Absolute: 4.9 10*3/uL — ABNORMAL HIGH (ref 0.1–1.0)
Monocytes Relative: 5 %
Myelocytes: 11 %
Neutro Abs: 60.1 10*3/uL — ABNORMAL HIGH (ref 1.7–7.7)
Neutrophils Relative %: 54 %
Platelets: 67 10*3/uL — ABNORMAL LOW (ref 150–400)
Promyelocytes Relative: 6 %
RBC: 2.58 MIL/uL — ABNORMAL LOW (ref 3.87–5.11)
RDW: 20.6 % — ABNORMAL HIGH (ref 11.5–15.5)
WBC: 98.6 10*3/uL (ref 4.0–10.5)
nRBC: 2.3 % — ABNORMAL HIGH (ref 0.0–0.2)
nRBC: 5 /100 WBC — ABNORMAL HIGH

## 2021-07-15 LAB — PREPARE RBC (CROSSMATCH)

## 2021-07-15 LAB — SAMPLE TO BLOOD BANK

## 2021-07-15 MED ORDER — HEPARIN SOD (PORK) LOCK FLUSH 100 UNIT/ML IV SOLN
500.0000 [IU] | Freq: Once | INTRAVENOUS | Status: AC
  Administered 2021-07-15: 500 [IU] via INTRAVENOUS

## 2021-07-15 MED ORDER — SODIUM CHLORIDE 0.9% FLUSH
10.0000 mL | INTRAVENOUS | Status: DC | PRN
  Administered 2021-07-15: 10 mL via INTRAVENOUS

## 2021-07-15 NOTE — Progress Notes (Signed)
CRITICAL VALUE ALERT Critical value received:  WBC 98.6.  Date of notification:  07-15-2021 Time of notification: 13:27 pm Critical value read back:  Yes.   Nurse who received alert:  B. Maylynn Orzechowski RN  MD notified time and response:  Katragadda. No new orders received.   Patient's HGB 7.6.

## 2021-07-15 NOTE — Progress Notes (Signed)
Renee Waters presented for Portacath access and flush with labs.  Portacath located right chest wall accessed with  H 20 needle.  Good blood return present. Portacath flushed with 36m NS and 500U/555mHeparin and needle removed intact.  Procedure tolerated well and without incident.

## 2021-07-15 NOTE — Progress Notes (Signed)
HGB 7.6 today. Per Sylvester Harder RN / [name] infuse 1 unit of PRBC's tomorrow 07-16-2021. Blood bank notified and orders in signed and held. Patient has + antibodies blood bank aware.

## 2021-07-15 NOTE — Addendum Note (Signed)
Addended by: Benjiman Core D on: 07/15/2021 01:57 PM   Modules accepted: Orders

## 2021-07-15 NOTE — Progress Notes (Signed)
Order placed for [place] Right Arm per [name]

## 2021-07-16 ENCOUNTER — Inpatient Hospital Stay (HOSPITAL_COMMUNITY): Payer: Medicare Other

## 2021-07-16 DIAGNOSIS — Z79899 Other long term (current) drug therapy: Secondary | ICD-10-CM | POA: Diagnosis not present

## 2021-07-16 DIAGNOSIS — D471 Chronic myeloproliferative disease: Secondary | ICD-10-CM

## 2021-07-16 DIAGNOSIS — R109 Unspecified abdominal pain: Secondary | ICD-10-CM | POA: Diagnosis not present

## 2021-07-16 DIAGNOSIS — M791 Myalgia, unspecified site: Secondary | ICD-10-CM | POA: Diagnosis not present

## 2021-07-16 DIAGNOSIS — C946 Myelodysplastic disease, not classified: Secondary | ICD-10-CM | POA: Diagnosis not present

## 2021-07-16 DIAGNOSIS — K5900 Constipation, unspecified: Secondary | ICD-10-CM | POA: Diagnosis not present

## 2021-07-16 DIAGNOSIS — R634 Abnormal weight loss: Secondary | ICD-10-CM | POA: Diagnosis not present

## 2021-07-16 MED ORDER — SODIUM CHLORIDE 0.9% FLUSH
10.0000 mL | INTRAVENOUS | Status: AC | PRN
  Administered 2021-07-16: 10 mL

## 2021-07-16 MED ORDER — CEPHALEXIN 500 MG PO CAPS: 500.0000 mg | ORAL_CAPSULE | Freq: Three times a day (TID) | ORAL | 0 refills | Status: DC

## 2021-07-16 MED ORDER — SODIUM CHLORIDE 0.9% IV SOLUTION
250.0000 mL | Freq: Once | INTRAVENOUS | Status: AC
  Administered 2021-07-16: 250 mL via INTRAVENOUS

## 2021-07-16 MED ORDER — DIPHENHYDRAMINE HCL 25 MG PO CAPS
25.0000 mg | ORAL_CAPSULE | Freq: Once | ORAL | Status: AC
  Administered 2021-07-16: 25 mg via ORAL
  Filled 2021-07-16: qty 1

## 2021-07-16 MED ORDER — ACETAMINOPHEN 325 MG PO TABS
650.0000 mg | ORAL_TABLET | Freq: Once | ORAL | Status: AC
  Administered 2021-07-16: 650 mg via ORAL
  Filled 2021-07-16: qty 2

## 2021-07-16 MED ORDER — HEPARIN SOD (PORK) LOCK FLUSH 100 UNIT/ML IV SOLN
500.0000 [IU] | Freq: Every day | INTRAVENOUS | Status: AC | PRN
  Administered 2021-07-16: 500 [IU]

## 2021-07-16 NOTE — Progress Notes (Signed)
UNMATCHED BLOOD PRODUCT NOTE  Compare the patient ID on the blood tag to the patient ID on the hospital armband and Blood Bank armband. Then confirm the unit number on the blood tag matches the unit number on the blood product.  If a discrepancy is discovered return the product to blood bank immediately.   Blood Product Type: Packed Red Blood Cells  Unit #: (Found on blood product bag, begins with W) [id number]  Product Code #: (Found on blood product bag, begins with E) 0382V00   Start Time: 0940  Starting Rate: 120 ml/hr  Rate increase/decreased  (if applicable):      060 ml/hr  Rate changed time (if applicable): 0459   Stop Time: 1115  Blood verified by 2 RN's.     All Other Documentation should be documented within the Blood Admin Flowsheet per policy.

## 2021-07-16 NOTE — Patient Instructions (Signed)
[facility]  Discharge Instructions: Thank you for choosing [facility] to provide your oncology and hematology care.  If you have a lab appointment with the Cancer Center, please come in thru the Main Entrance and check in at the main information desk.  Wear comfortable clothing and clothing appropriate for easy access to any Portacath or PICC line.   We strive to give you quality time with your provider. You may need to reschedule your appointment if you arrive late (15 or more minutes).  Arriving late affects you and other patients whose appointments are after yours.  Also, if you miss three or more appointments without notifying the office, you may be dismissed from the clinic at the provider's discretion.      For prescription refill requests, have your pharmacy contact our office and allow 72 hours for refills to be completed.        To help prevent nausea and vomiting after your treatment, we encourage you to take your nausea medication as directed.  BELOW ARE SYMPTOMS THAT SHOULD BE REPORTED IMMEDIATELY: *FEVER GREATER THAN 100.4 F (38 C) OR HIGHER *CHILLS OR SWEATING *NAUSEA AND VOMITING THAT IS NOT CONTROLLED WITH YOUR NAUSEA MEDICATION *UNUSUAL SHORTNESS OF BREATH *UNUSUAL BRUISING OR BLEEDING *URINARY PROBLEMS (pain or burning when urinating, or frequent urination) *BOWEL PROBLEMS (unusual diarrhea, constipation, pain near the anus) TENDERNESS IN MOUTH AND THROAT WITH OR WITHOUT PRESENCE OF ULCERS (sore throat, sores in mouth, or a toothache) UNUSUAL RASH, SWELLING OR PAIN  UNUSUAL VAGINAL DISCHARGE OR ITCHING   Items with * indicate a potential emergency and should be followed up as soon as possible or go to the Emergency Department if any problems should occur.  Please show the CHEMOTHERAPY ALERT CARD or IMMUNOTHERAPY ALERT CARD at check-in to the Emergency Department and triage nurse.  Should you have questions after your visit or need to cancel  or reschedule your appointment, please contact [facility] [phone]  and follow the prompts.  Office hours are 8:00 a.m. to 4:30 p.m. Monday - Friday. Please note that voicemails left after 4:00 p.m. may not be returned until the following business day.  We are closed weekends and major holidays. You have access to a nurse at all times for urgent questions. Please call the main number to the clinic [phone] and follow the prompts.  For any non-urgent questions, you may also contact your provider using MyChart. We now offer e-Visits for anyone 18 and older to request care online for non-urgent symptoms. For details visit [email or web address].   Also download the MyChart app! Go to the app store, search "MyChart", open the app, select [facility], and log in with your MyChart username and password.  Due to Covid, a mask is required upon entering the hospital/clinic. If you do not have a mask, one will be given to you upon arrival. For doctor visits, patients may have 1 support person aged 18 or older with them. For treatment visits, patients cannot have anyone with them due to current Covid guidelines and our immunocompromised population.  

## 2021-07-16 NOTE — Addendum Note (Signed)
Addended by: Derek Jack on: 07/16/2021 01:14 PM   Modules accepted: Orders

## 2021-07-16 NOTE — Progress Notes (Signed)
Patient presents today for one unit of PRBC.  Patient is in satisfactory condition with only complaints of weakness.  BP today is 75/43.  All other vitals are stable.  [name] informed of low BP.  He suggested rechecking after the blood transfusion and giving additional fluids if she remained hypotensive.  We will proceed with transfusion per MD orders.   Patient tolerated transfusion well with no complaints voiced.  Patient left ambulatory in stable condition.  Vital signs stable at discharge.  Follow up as scheduled.

## 2021-07-19 LAB — TYPE AND SCREEN
ABO/RH(D): O POS
Antibody Screen: POSITIVE
DAT, IgG: NEGATIVE
Donor AG Type: NEGATIVE
Donor AG Type: NEGATIVE
Unit division: 0
Unit division: 0

## 2021-07-19 LAB — BPAM RBC
Blood Product Expiration Date: 202307142359
Blood Product Expiration Date: 202307152359
ISSUE DATE / TIME: 202306090923
ISSUE DATE / TIME: 202306101106
Unit Type and Rh: 5100
Unit Type and Rh: 5100

## 2021-07-22 ENCOUNTER — Ambulatory Visit: Payer: Medicare Other | Admitting: Family Medicine

## 2021-07-26 ENCOUNTER — Other Ambulatory Visit (HOSPITAL_COMMUNITY): Payer: Self-pay | Admitting: Hematology

## 2021-07-27 ENCOUNTER — Ambulatory Visit: Payer: Medicare Other | Admitting: Family Medicine

## 2021-07-29 NOTE — Telephone Encounter (Signed)
Patient exhausted Education officer, community.  Called [employer] to see if patient was able to get assistance.  Patient was approved for assistance from 07/08/21-02/06/22.  [facility] Phone [phone] Fax [phone] 07/29/2021 11:20 AM

## 2021-08-03 ENCOUNTER — Ambulatory Visit (INDEPENDENT_AMBULATORY_CARE_PROVIDER_SITE_OTHER): Payer: Medicare Other | Admitting: *Deleted

## 2021-08-03 DIAGNOSIS — G8929 Other chronic pain: Secondary | ICD-10-CM

## 2021-08-03 DIAGNOSIS — I10 Essential (primary) hypertension: Secondary | ICD-10-CM

## 2021-08-04 ENCOUNTER — Ambulatory Visit (INDEPENDENT_AMBULATORY_CARE_PROVIDER_SITE_OTHER): Payer: Medicare Other | Admitting: Family Medicine

## 2021-08-04 VITALS — BP 168/75 | HR 84 | Resp 16 | Ht 65.0 in | Wt 130.0 lb

## 2021-08-04 DIAGNOSIS — R52 Pain, unspecified: Secondary | ICD-10-CM | POA: Diagnosis not present

## 2021-08-04 DIAGNOSIS — F418 Other specified anxiety disorders: Secondary | ICD-10-CM

## 2021-08-04 DIAGNOSIS — F324 Major depressive disorder, single episode, in partial remission: Secondary | ICD-10-CM

## 2021-08-04 DIAGNOSIS — I10 Essential (primary) hypertension: Secondary | ICD-10-CM | POA: Diagnosis not present

## 2021-08-04 DIAGNOSIS — R636 Underweight: Secondary | ICD-10-CM | POA: Diagnosis not present

## 2021-08-04 DIAGNOSIS — F5105 Insomnia due to other mental disorder: Secondary | ICD-10-CM

## 2021-08-04 NOTE — Patient Instructions (Signed)
F/U in 4 months, call if you need me sooner  No changes in medication  Carefull not to fall  Keep eating often and foods that you enjoy that are good for you  Though blood pressure high today, you report pain, and it has been low at recent vsiits , so no change in medication  Monitor cysts on legs, CALL IF YOU HAVE INCREASED CONCERN, AS AN ULTRASOUND CAN BE ARRANGED, I DO NOT RECOMMEND THIS NOW  THANKFUL YOU FEEL BETTER  Thanks for choosing [facility], we consider it a privelige to serve you.

## 2021-08-05 ENCOUNTER — Other Ambulatory Visit: Payer: Self-pay

## 2021-08-05 ENCOUNTER — Inpatient Hospital Stay (HOSPITAL_COMMUNITY): Payer: Medicare Other

## 2021-08-05 ENCOUNTER — Encounter (HOSPITAL_COMMUNITY): Payer: Self-pay

## 2021-08-05 ENCOUNTER — Inpatient Hospital Stay (HOSPITAL_BASED_OUTPATIENT_CLINIC_OR_DEPARTMENT_OTHER): Payer: Medicare Other | Admitting: Hematology

## 2021-08-05 DIAGNOSIS — R634 Abnormal weight loss: Secondary | ICD-10-CM | POA: Diagnosis not present

## 2021-08-05 DIAGNOSIS — K5900 Constipation, unspecified: Secondary | ICD-10-CM | POA: Diagnosis not present

## 2021-08-05 DIAGNOSIS — R109 Unspecified abdominal pain: Secondary | ICD-10-CM | POA: Diagnosis not present

## 2021-08-05 DIAGNOSIS — D471 Chronic myeloproliferative disease: Secondary | ICD-10-CM

## 2021-08-05 DIAGNOSIS — M791 Myalgia, unspecified site: Secondary | ICD-10-CM | POA: Diagnosis not present

## 2021-08-05 DIAGNOSIS — Z79899 Other long term (current) drug therapy: Secondary | ICD-10-CM | POA: Diagnosis not present

## 2021-08-05 DIAGNOSIS — I10 Essential (primary) hypertension: Secondary | ICD-10-CM

## 2021-08-05 DIAGNOSIS — C946 Myelodysplastic disease, not classified: Secondary | ICD-10-CM | POA: Diagnosis not present

## 2021-08-05 LAB — CBC WITH DIFFERENTIAL/PLATELET
Abs Immature Granulocytes: 30.6 10*3/uL — ABNORMAL HIGH (ref 0.00–0.07)
Band Neutrophils: 21 %
Basophils Absolute: 0 10*3/uL (ref 0.0–0.1)
Basophils Relative: 0 %
Eosinophils Absolute: 0 10*3/uL (ref 0.0–0.5)
Eosinophils Relative: 0 %
HCT: 24.4 % — ABNORMAL LOW (ref 36.0–46.0)
Hemoglobin: 8 g/dL — ABNORMAL LOW (ref 12.0–15.0)
Lymphocytes Relative: 6 %
Lymphs Abs: 5.9 10*3/uL — ABNORMAL HIGH (ref 0.7–4.0)
MCH: 29.2 pg (ref 26.0–34.0)
MCHC: 32.8 g/dL (ref 30.0–36.0)
MCV: 89.1 fL (ref 80.0–100.0)
Metamyelocytes Relative: 11 %
Monocytes Absolute: 9.9 10*3/uL — ABNORMAL HIGH (ref 0.1–1.0)
Monocytes Relative: 10 %
Myelocytes: 14 %
Neutro Abs: 52.3 10*3/uL — ABNORMAL HIGH (ref 1.7–7.7)
Neutrophils Relative %: 32 %
Platelets: 76 10*3/uL — ABNORMAL LOW (ref 150–400)
Promyelocytes Relative: 6 %
RBC: 2.74 MIL/uL — ABNORMAL LOW (ref 3.87–5.11)
RDW: 20.8 % — ABNORMAL HIGH (ref 11.5–15.5)
WBC Morphology: INCREASED
WBC: 98.7 10*3/uL (ref 4.0–10.5)
nRBC: 2.2 % — ABNORMAL HIGH (ref 0.0–0.2)

## 2021-08-05 LAB — URIC ACID: Uric Acid, Serum: 3.3 mg/dL (ref 2.5–7.1)

## 2021-08-05 LAB — COMPREHENSIVE METABOLIC PANEL
ALT: 18 U/L (ref 0–44)
AST: 38 U/L (ref 15–41)
Albumin: 4 g/dL (ref 3.5–5.0)
Alkaline Phosphatase: 157 U/L — ABNORMAL HIGH (ref 38–126)
Anion gap: 4 — ABNORMAL LOW (ref 5–15)
BUN: 28 mg/dL — ABNORMAL HIGH (ref 8–23)
CO2: 24 mmol/L (ref 22–32)
Calcium: 8.7 mg/dL — ABNORMAL LOW (ref 8.9–10.3)
Chloride: 113 mmol/L — ABNORMAL HIGH (ref 98–111)
Creatinine, Ser: 1.35 mg/dL — ABNORMAL HIGH (ref 0.44–1.00)
GFR, Estimated: 41 mL/min — ABNORMAL LOW (ref >60)
Glucose, Bld: 93 mg/dL (ref 70–99)
Potassium: 4 mmol/L (ref 3.5–5.1)
Sodium: 141 mmol/L (ref 135–145)
Total Bilirubin: 0.7 mg/dL (ref 0.3–1.2)
Total Protein: 7.3 g/dL (ref 6.5–8.1)

## 2021-08-05 LAB — LACTATE DEHYDROGENASE: LDH: 893 U/L — ABNORMAL HIGH (ref 98–192)

## 2021-08-05 LAB — SAMPLE TO BLOOD BANK

## 2021-08-05 LAB — MAGNESIUM: Magnesium: 1.9 mg/dL (ref 1.7–2.4)

## 2021-08-05 LAB — PHOSPHORUS: Phosphorus: 3.4 mg/dL (ref 2.5–4.6)

## 2021-08-05 MED ORDER — HEPARIN SOD (PORK) LOCK FLUSH 100 UNIT/ML IV SOLN
500.0000 [IU] | Freq: Once | INTRAVENOUS | Status: AC
  Administered 2021-08-05: 500 [IU] via INTRAVENOUS

## 2021-08-05 MED ORDER — SODIUM CHLORIDE 0.9% FLUSH
10.0000 mL | Freq: Once | INTRAVENOUS | Status: AC
  Administered 2021-08-05: 10 mL via INTRAVENOUS

## 2021-08-05 NOTE — Progress Notes (Signed)
CRITICAL VALUE ALERT Critical value received:  WBC 98.7 Date of notification:  08-05-21 Time of notification: 5916 Critical value read back:  Yes.   Nurse who received alert:  C. Laelani Vasko RN MD notified time and response:  3846 no new orders.    Hemoglobin 8.0 today ,no blood needed per MD.

## 2021-08-05 NOTE — Patient Instructions (Addendum)
[facility] at [facility] Discharge Instructions  You were seen and examined today by [name].  [name] discussed your most recent lab work.  [name] is going to get you scheduled for a ultrasound of your spleen.   Follow-up as scheduled in 6 weeks.    Thank you for choosing [facility] at [facility] to provide your oncology and hematology care.  To afford each patient quality time with our provider, please arrive at least 15 minutes before your scheduled appointment time.   If you have a lab appointment with the [facility] please come in thru the Main Entrance and check in at the main information desk.  You need to re-schedule your appointment should you arrive 10 or more minutes late.  We strive to give you quality time with our providers, and arriving late affects you and other patients whose appointments are after yours.  Also, if you no show three or more times for appointments you may be dismissed from the clinic at the providers discretion.     Again, thank you for choosing [facility].  Our hope is that these requests will decrease the amount of time that you wait before being seen by our physicians.       _____________________________________________________________  Should you have questions after your visit to [facility], please contact our office at [phone] and follow the prompts.  Our office hours are 8:00 a.m. and 4:30 p.m. Monday - Friday.  Please note that voicemails left after 4:00 p.m. may not be returned until the following business day.  We are closed weekends and major holidays.  You do have access to a nurse 24-7, just call the main number to the clinic [phone] and do not press any options, hold on the line and a nurse will answer the phone.    For prescription refill requests, have your pharmacy contact our office and allow 72 hours.    Due to Covid, you will  need to wear a mask upon entering the hospital. If you do not have a mask, a mask will be given to you at the Main Entrance upon arrival. For doctor visits, patients may have 1 support person age 63 or older with them. For treatment visits, patients can not have anyone with them due to social distancing guidelines and our immunocompromised population.

## 2021-08-05 NOTE — Progress Notes (Signed)
Renee Waters, [place] [address]   CLINIC:  Medical Oncology/Hematology  PCP:  [name], MD [address], [place] / [place] [address]  [phone]  REASON FOR VISIT:  Follow-up for MPL positive myeloproliferative neoplasm  PRIOR THERAPY: none  CURRENT THERAPY: Jakafi 10 mg twice daily  INTERVAL HISTORY:  [name], a 76 y.o. female, returns for routine follow-up for her MPL positive myeloproliferative neoplasm. Renee Waters was last seen on 06/24/2021.  Today she reports feeling good. She has lost 3 lbs since her last visit. She reports constipation. She continues to take [facility]. She reports cramping in her legs, hands, and arms. She continues to have a dry cough which is stable and occurs most after waking up.  Her left sided abdominal pain is stable, and she denies diarrhea.   REVIEW OF SYSTEMS:  Review of Systems  Constitutional:  Negative for appetite change and fatigue. Unexpected weight change: -3 lbs. Respiratory:  Positive for cough (dry).   Gastrointestinal:  Positive for abdominal pain (stable) and constipation. Negative for diarrhea.  Musculoskeletal:  Positive for myalgias (4/10 legs and arms).  Neurological:  Positive for numbness (R hand).  All other systems reviewed and are negative.   PAST MEDICAL/SURGICAL HISTORY:  Past Medical History:  Diagnosis Date   Acute cholangitis    Allergy    Anemia    Anxiety    Arthritis    Phreesia 03/18/2020   Barrett's esophagus    Cataract    Chronic back pain    Chronic neck pain    CKD (chronic kidney disease) stage 3, GFR 30-59 ml/min ([place]) 10/29/2020   Depression    Genital herpes    GERD (gastroesophageal reflux disease)    H/O degenerative disc disease    History of blood transfusion    Hypertension    Insomnia    Lupus (systemic lupus erythematosus) ([place])    Neuromuscular disorder ([place])    Osteoarthritis    S/P colonoscopy June 2005   normal, no  polyps   S/P endoscopy June 2005, Oct 2009   2005: short-segment Barrett's, 2009: short-segment Barrett's   Upper respiratory tract infection 07/09/2020   UTI (lower urinary tract infection) 11/2012   Past Surgical History:  Procedure Laterality Date   ABDOMINAL HYSTERECTOMY     BACK SURGERY     BIOPSY N/A 03/20/2014   Procedure: BIOPSY;  Surgeon: Daneil Dolin, MD;  Location: [facility];  Service: Endoscopy;  Laterality: N/A;   BIOPSY  09/14/2015   Procedure: BIOPSY;  Surgeon: Daneil Dolin, MD;  Location: [facility];  Service: Endoscopy;;  esophageal and gastric   BIOPSY  07/23/2019   Procedure: BIOPSY;  Surgeon: Daneil Dolin, MD;  Location: [facility];  Service: Endoscopy;;  esophageal    CARPAL TUNNEL RELEASE Left 2013   cervical disectomy  2002   CESAREAN SECTION N/A    Phreesia 03/18/2020   CHOLECYSTECTOMY     with lysis of adhesions for sbo; "ruptured gallbladder".   COLONOSCOPY  11/09/2011   RMR: Melanosis coli   COLONOSCOPY WITH PROPOFOL N/A 09/14/2015   Dr. Gala Romney: diverticulosis, 77m TA removed. next TCS 09/2020.    COLONOSCOPY WITH PROPOFOL N/A 04/30/2020   Procedure: COLONOSCOPY WITH PROPOFOL;  Surgeon: RDaneil Dolin MD;  Location: [facility];  Service: Endoscopy;  Laterality: N/A;  PM (ASA 3)   DENTAL SURGERY  11/2015   multiple tooth extraction   ESOPHAGOGASTRODUODENOSCOPY  11/29/2007   salmon-colored  tongue   longest stable at  3 cm, distal esophagus as described previously status post biopsy/ Hiatal hernia, otherwise normal stomach D1 and D2   ESOPHAGOGASTRODUODENOSCOPY  01/06/11   short segment Barrett's esophagus s/p bx/Hiatal hernia   ESOPHAGOGASTRODUODENOSCOPY (EGD) WITH PROPOFOL N/A 03/20/2014   CHY:IFOYDXAJ distal esophagus short segment barrett's, bx with no dysplasia. next egd in 03/2017   ESOPHAGOGASTRODUODENOSCOPY (EGD) WITH PROPOFOL N/A 09/14/2015   Dr. Gala Romney: Barrett's without dysplasia, gastritis benign bx, hiatal hernia. next EGD 09/2018.    ESOPHAGOGASTRODUODENOSCOPY (EGD) WITH PROPOFOL N/A 07/23/2019   Procedure: ESOPHAGOGASTRODUODENOSCOPY (EGD) WITH PROPOFOL;  Surgeon: Daneil Dolin, MD;  [facility];  Service: Endoscopy;  Laterality: N/A;  3:00pm   EYE SURGERY N/A    Phreesia 03/18/2020   HERNIA REPAIR Right 07/2010   Dr. Zada Girt   IR IMAGING GUIDED PORT INSERTION  02/09/2021   JOINT REPLACEMENT     LAPAROSCOPIC CHOLECYSTECTOMY  2017   at [facility]   POLYPECTOMY  09/14/2015   Procedure: POLYPECTOMY;  Surgeon: Daneil Dolin, MD;  Location: [facility];  Service: Endoscopy;;  ascending colon   right hip replacement  07/2010   went back in sept 2012 to fix   SHOULDER ARTHROSCOPY  2008   left   SPINE SURGERY N/A    Phreesia 03/18/2020   TOTAL HIP REVISION Right 12/17/2012   Procedure: RIGHT TOTAL HIP REVISION;  Surgeon: Mauri Pole, MD;  Location: [facility];  Service: Orthopedics;  Laterality: Right;   WRIST SURGERY Right 2011   open reduction right wrist.    SOCIAL HISTORY:  Social History   Socioeconomic History   Marital status: Married    Spouse name: louis   Number of children: 4   Years of education: 12+   Highest education level: Some college, no degree  Occupational History   Occupation: Press photographer  - retired   Occupation: non profit  Tobacco Use   Smoking status: Former    Packs/day: 0.25    Years: 25.00    Total pack years: 6.25    Types: Cigarettes    Quit date: 02/07/2003    Years since quitting: 18.5   Smokeless tobacco: Never   Tobacco comments:    quit in 2004  Vaping Use   Vaping Use: Never used  Substance and Sexual Activity   Alcohol use: No   Drug use: No   Sexual activity: Not Currently    Birth control/protection: Surgical  Other Topics Concern   Not on file  Social History Narrative   Not on file   Social Determinants of Health   Financial Resource Strain: Low Risk  (12/28/2020)   Overall Financial Resource Strain (CARDIA)    Difficulty of Paying Living  Expenses: Not hard at all  Food Insecurity: No Food Insecurity (12/28/2020)   Hunger Vital Sign    Worried About Running Out of Food in the Last Year: Never true    [place] in the Last Year: Never true  Transportation Needs: No Transportation Needs (12/28/2020)   PRAPARE - Hydrologist (Medical): No    Lack of Transportation (Non-Medical): No  Recent Concern: Transportation Needs - Unmet Transportation Needs (10/13/2020)   PRAPARE - Transportation    Lack of Transportation (Medical): Yes    Lack of Transportation (Non-Medical): Yes  Physical Activity: Inactive (04/23/2021)   Exercise Vital Sign    Days of Exercise per Week: 0 days  Minutes of Exercise per Session: 0 min  Stress: Stress Concern Present (04/23/2021)   [place]    Feeling of Stress : Rather much  Social Connections: Moderately Integrated (12/28/2020)   Social Connection and Isolation Panel [NHANES]    Frequency of Communication with Friends and Family: More than three times a week    Frequency of Social Gatherings with Friends and Family: Once a week    Attends Religious Services: More than 4 times per year    Active Member of [employer] or Organizations: No    Attends Archivist Meetings: Never    Marital Status: Married  Human resources officer Violence: Not At Risk (12/28/2020)   Humiliation, Afraid, Rape, and Kick questionnaire    Fear of Current or Ex-Partner: No    Emotionally Abused: No    Physically Abused: No    Sexually Abused: No    FAMILY HISTORY:  Family History  Problem Relation Age of Onset   Hypertension Mother    Stroke Mother    Colon cancer Neg Hx    Anesthesia problems Neg Hx    Hypotension Neg Hx    Malignant hyperthermia Neg Hx    Pseudochol deficiency Neg Hx    Gastric cancer Neg Hx    Esophageal cancer Neg Hx     CURRENT MEDICATIONS:  Current Outpatient Medications  Medication  Sig Dispense Refill   acyclovir (ZOVIRAX) 800 MG tablet Take 1 tablet (800 mg total) by mouth 5 (five) times daily. 50 tablet 1   acyclovir ointment (ZOVIRAX) 5 % Apply 1 application. topically every 3 (three) hours. 15 g 1   allopurinol (ZYLOPRIM) 300 MG tablet Take 1 tablet by mouth once daily (Patient not taking: Reported on 08/04/2021) 30 tablet 0   ALPRAZolam (XANAX) 0.5 MG tablet Take 1 tablet (0.5 mg total) by mouth at bedtime. 30 tablet 5   cetirizine (ZYRTEC) 10 MG tablet Take 1 tablet (10 mg total) by mouth 2 (two) times daily. 60 tablet 11   citalopram (CELEXA) 20 MG tablet Take 1 tablet (20 mg total) by mouth daily. 30 tablet 3   EPINEPHRINE 0.3 mg/0.3 mL IJ SOAJ injection INJECT 0.3 MLS INTO MUSCLE ONCE AS NEEDED 1 Device 2   gabapentin (NEURONTIN) 300 MG capsule Take one capsule by mouth once daily, as needed, for pain 90 capsule 1   hydrochlorothiazide (HYDRODIURIL) 25 MG tablet Take 1 tablet by mouth once daily 90 tablet 0   lidocaine-prilocaine (EMLA) cream Apply 1 application. topically as needed. 30 g 3   losartan (COZAAR) 25 MG tablet Take 1 tablet by mouth once daily for blood pressure 30 tablet 5   meloxicam (MOBIC) 7.5 MG tablet Take 1 tablet (7.5 mg total) by mouth daily as needed for pain (knee pain). 30 tablet 5   mirtazapine (REMERON) 15 MG tablet Take 1 tablet (15 mg total) by mouth at bedtime. 30 tablet 5   oxyCODONE (OXY IR/ROXICODONE) 5 MG immediate release tablet Take 5 mg by mouth every 4 (four) hours as needed.     pantoprazole (PROTONIX) 40 MG tablet TAKE 1 TABLET BY MOUTH ONCE DAILY BEFORE BREAKFAST 90 tablet 3   potassium chloride SA (KLOR-CON M) 10 MEQ tablet Take 1 tablet (10 mEq total) by mouth daily. 30 tablet 3   Probiotic Product (EQL DAILY PROBIOTIC PO) Take 1 capsule by mouth daily.     ruxolitinib phosphate (JAKAFI) 15 MG tablet Take 1 tablet (15 mg total) by  mouth 2 (two) times daily. 60 tablet 3   temazepam (RESTORIL) 30 MG capsule Take 1 capsule (30  mg total) by mouth at bedtime as needed for sleep. 30 capsule 5   tiZANidine (ZANAFLEX) 4 MG tablet Take 8 mg by mouth 3 (three) times daily as needed (spasms). 180 tablet 0   No current facility-administered medications for this visit.   Facility-Administered Medications Ordered in Other Visits  Medication Dose Route Frequency Provider Last Rate Last Admin   lanreotide acetate (SOMATULINE DEPOT) 120 MG/0.5ML injection            octreotide (SANDOSTATIN LAR) 30 MG IM injection             ALLERGIES:  Allergies  Allergen Reactions   Bee Venom Swelling and Hives   Acetaminophen     itches   Amlodipine     Hair loss   Tyloxapol    Other Itching, Rash and Swelling   Oxycodone-Acetaminophen Rash    Pt states, "this gives her a rash, but at home she takes oxycodone for pain relief" Pt states, "this gives her a rash, but at home she takes oxycodone for pain relief"    PHYSICAL EXAM:  Performance status (ECOG): 1 - Symptomatic but completely ambulatory  There were no vitals filed for this visit. Wt Readings from Last 3 Encounters:  08/04/21 130 lb 0.6 oz (59 kg)  07/15/21 131 lb 6.3 oz (59.6 kg)  06/24/21 130 lb 4.8 oz (59.1 kg)   Physical Exam Vitals reviewed.  Constitutional:      Appearance: Normal appearance.  Cardiovascular:     Rate and Rhythm: Normal rate and regular rhythm.     Pulses: Normal pulses.     Heart sounds: Normal heart sounds.  Pulmonary:     Effort: Pulmonary effort is normal.     Breath sounds: Normal breath sounds.  Abdominal:     Palpations: Abdomen is soft. There is hepatomegaly (palpable 2 fingerbreadths below costal margin) and splenomegaly (palpable 3 fingerbreadths below costal margin). There is no mass.     Tenderness: There is no abdominal tenderness.  Musculoskeletal:     Right lower leg: No edema.     Left lower leg: No edema.  Neurological:     General: No focal deficit present.     Mental Status: She is alert and oriented to person,  place, and time.  Psychiatric:        Mood and Affect: Mood normal.        Behavior: Behavior normal.     LABORATORY DATA:  I have reviewed the labs as listed.     Latest Ref Rng & Units 07/15/2021   11:54 AM 06/24/2021   10:27 AM 06/10/2021   10:23 AM  CBC  WBC 4.0 - 10.5 K/uL 98.6  99.7  105.5   Hemoglobin 12.0 - 15.0 g/dL 7.6  7.1  8.2   Hematocrit 36.0 - 46.0 % 23.5  22.0  25.9   Platelets 150 - 400 K/uL 67  75  106       Latest Ref Rng & Units 06/24/2021   10:27 AM 05/25/2021    1:20 PM 05/04/2021   12:36 PM  CMP  Glucose 70 - 99 mg/dL 89  102  116   BUN 8 - 23 mg/dL 36  31  31   Creatinine 0.44 - 1.00 mg/dL 1.69  1.33  1.48   Sodium 135 - 145 mmol/L 138  135  140   Potassium  3.5 - 5.1 mmol/L 4.5  3.8  3.4   Chloride 98 - 111 mmol/L 109  107  108   CO2 22 - 32 mmol/L 24  23  24    Calcium 8.9 - 10.3 mg/dL 7.9  8.2  8.2   Total Protein 6.5 - 8.1 g/dL 7.1  7.2  7.4   Total Bilirubin 0.3 - 1.2 mg/dL 0.5  0.5  0.5   Alkaline Phos 38 - 126 U/L 155  149  150   AST 15 - 41 U/L 35  38  46   ALT 0 - 44 U/L 16  16  18        Component Value Date/Time   RBC 2.58 (L) 07/15/2021 1154   MCV 91.1 07/15/2021 1154   MCV 91 05/27/2020 1632   MCH 29.5 07/15/2021 1154   MCHC 32.3 07/15/2021 1154   RDW 20.6 (H) 07/15/2021 1154   RDW 14.1 05/27/2020 1632   LYMPHSABS 6.9 (H) 07/15/2021 1154   LYMPHSABS 1.7 05/27/2020 1632   MONOABS 4.9 (H) 07/15/2021 1154   EOSABS 0.0 07/15/2021 1154   EOSABS 0.2 05/27/2020 1632   BASOSABS 0.0 07/15/2021 1154   BASOSABS 0.0 05/27/2020 1632    DIAGNOSTIC IMAGING:  I have independently reviewed the scans and discussed with the patient. US Venous Img Upper Uni Right  Result Date: 07/15/2021 CLINICAL DATA:  Right upper arm pain and swelling. EXAM: RIGHT UPPER EXTREMITY VENOUS DOPPLER ULTRASOUND TECHNIQUE: Gray-scale sonography with graded compression, as well as color Doppler and duplex ultrasound were performed to evaluate the upper extremity deep  venous system from the level of the subclavian vein and including the jugular, axillary, basilic, radial, ulnar and upper cephalic vein. Spectral Doppler was utilized to evaluate flow at rest and with distal augmentation maneuvers. COMPARISON:  None Available. FINDINGS: Contralateral Subclavian Vein: No evidence of thrombus. Normal color Doppler flow and phasicity. Internal Jugular Vein: No evidence of thrombus. Compressibility with color Doppler flow and phasicity. Subclavian Vein: No evidence of thrombus. Compressibility with color Doppler flow and phasicity. Axillary Vein: No evidence of thrombus. Normal compressibility, color Doppler flow and augmentation. Cephalic Vein: No evidence of thrombus. Normal compressibility, color Doppler flow and augmentation. Basilic Vein: No evidence of thrombus. Normal compressibility, color Doppler flow and augmentation. Brachial Veins: No evidence of thrombus. Normal compressibility, color Doppler flow and augmentation. Radial Veins: No evidence of thrombus.  Normal compressibility. Ulnar Veins: No evidence of thrombus.  Normal compressibility. Other Findings: Area of concern is the mid forearm. There are small hyperechoic nodular structures just below the skin surface at the area of concern. The largest structure measures 1.2 cm. These small nodular structures do not appear to be vascular in etiology. IMPRESSION: 1. No evidence for deep venous thrombosis in the right upper extremity. 2. Small superficial hyperechoic structures at the area of concern in the forearm. These structures do not appear to be vascular in etiology. Findings could represent focal areas of inflammation or even small lipomas. Electronically Signed   By: Markus Daft M.D.   On: 07/15/2021 14:44     ASSESSMENT:  Leukocytosis with left shift: - CBC on 11/06/2020 with white count 75.6, differential 59% neutrophils, 12% monocytes, 4% lymphocytes, 1 percentage of eosinophils and basophils, 6% band neutrophils,  12% myelocytes, 1% promyelocytes, 29% blasts. - Pathologist review of blood smear reported as leukoerythroblastic reaction. - 25 pound weight loss in the last 6 months, unintentional.  Decreased appetite.  Reports fatigue for the last few months.  Reports  night sweats x3 in the last 1 month. - Bone marrow biopsy on 11/18/2020 consistent with granulocytic proliferation with differential including CMML versus myeloproliferative disorder. - BCR/ABL was negative. 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 - NGS myeloid panel shows mutations in ASXL1, MPL, TET2, EZH2 mutations. - Spleen ultrasound on 12/16/2020 shows mildly enlarged measuring 12.4 x 9.6 x 7 cm with volume of 435 cc. - PDGFR alpha, beta and FGFR 1 was negative. - She was evaluated by Dr. Florene Glen at [facility].  Slides were reviewed at [facility] hematopathology.  They thought it was less likely CMML and more likely MPL positive myeloproliferative neoplasm.  Dr. Florene Glen has recommended initiate Jakafi. - Ruxolitinib 10 mg twice daily started on 02/16/2021.  Dose increased to 15 mg twice daily on 05/18/2021. - CTAP on 04/07/2021: Hepatomegaly and splenomegaly measuring 14.3 cm.  No other pathology.  No spleen infarcts.  2.  Social/family history: - She lives at home and is able to do all her ADLs and IADLs although she is getting tired lately.  She reports quitting smoking 6 months ago and smoked 1 pack/week for 43 years. - She believes that her mother had some kind of leukemia.  No other malignancies.   PLAN:  MPL positive myeloproliferative neoplasm: - She is taking ruxolitinib 15 mg twice daily which was increased on 05/18/2021. - Physical examination today shows splenomegaly 2 fingerbreadths below the left costal margin and hepatomegaly 3 fingerbreadths below the right costal margin. - She missed her spleen ultrasound. - She reports cough when she gets up in the morning.  No production.  Denies any GI side  effects other than constipation for which she takes Benefiber. - Reviewed labs today which shows white count is 98 and hemoglobin 8.  Platelet count is 76.  Alk phos is 157 rest of LFTs are normal. - Continue same dose of Jakafi 15 mg twice daily. - We discussed the right forearm ultrasound from 07/15/2021 which did not show DVT.  Subcutaneous nodules does not appear to be vascular etiology. - RTC 6 weeks for follow-up.  We will plan to repeat spleen ultrasound prior to next visit.  2.  Decreased appetite: - Weight has been stable.  She is eating multiple small meals per day.  3.  CKD: - Baseline creatinine between 1.3-1.5.  Today creatinine is 1.35.  4.  Hyperuricemia: - Continue allopurinol 300 mg daily.  Uric acid is 3.3.  5.  Hypokalemia: - Continue potassium 10 mEq daily.  Potassium normal.  Orders placed this encounter:  No orders of the defined types were placed in this encounter.    Derek Jack, MD [place] [phone]   I, Thana Ates, am acting as a scribe for Dr. Derek Jack.  I, Derek Jack MD, have reviewed the above documentation for accuracy and completeness, and I agree with the above.

## 2021-08-06 ENCOUNTER — Inpatient Hospital Stay (HOSPITAL_COMMUNITY): Payer: Medicare Other

## 2021-08-06 ENCOUNTER — Other Ambulatory Visit (HOSPITAL_COMMUNITY): Payer: Self-pay | Admitting: Hematology

## 2021-08-13 ENCOUNTER — Other Ambulatory Visit (HOSPITAL_BASED_OUTPATIENT_CLINIC_OR_DEPARTMENT_OTHER): Payer: Self-pay

## 2021-08-16 ENCOUNTER — Other Ambulatory Visit (HOSPITAL_BASED_OUTPATIENT_CLINIC_OR_DEPARTMENT_OTHER): Payer: Self-pay

## 2021-08-16 ENCOUNTER — Encounter: Payer: Self-pay | Admitting: Family Medicine

## 2021-08-16 MED ORDER — OXYCODONE HCL 5 MG PO TABS
ORAL_TABLET | ORAL | 0 refills | Status: DC
  Filled 2021-08-16: qty 180, 30d supply, fill #0

## 2021-08-16 MED ORDER — ALPRAZOLAM 0.5 MG PO TABS: 0.5000 mg | ORAL_TABLET | Freq: Every evening | ORAL | 5 refills | Status: DC | PRN

## 2021-08-16 NOTE — Assessment & Plan Note (Signed)
Improved, just returned from vacation with son, and no need for therapy currently

## 2021-08-16 NOTE — Assessment & Plan Note (Signed)
Sleep hygiene reviewed and written information offered also. Prescription sent for  medication needed.  

## 2021-08-16 NOTE — Assessment & Plan Note (Signed)
Managed through pain clinic

## 2021-08-16 NOTE — Progress Notes (Signed)
   Renee Waters     MRN: [id number]      DOB: 03-13-1945   HPI Renee Waters is here for follow up and re-evaluation of chronic medical conditions, in particular hypertension, states she is stressed and in pain and normally at home her bP is normal, record review is consistent with this medication management and review of any available recent lab and radiology data.  Preventive health is updated, specifically  Cancer screening and Immunization.   Questions or concerns regarding consultations or procedures which the PT has had in the interim are  addressed. The PT denies any adverse reactions to current medications since the last visit.  C/o cystic swellings on her legs, concerned if blood clots or cancer,  ROS Denies recent fever or chills. Denies sinus pressure, nasal congestion, ear pain or sore throat. Denies chest congestion, productive cough or wheezing. Denies chest pains, palpitations and leg swelling Denies abdominal pain, nausea, vomiting,diarrhea or constipation.   Denies dysuria, frequency, hesitancy or incontinence. Denies joint pain, swelling and limitation in mobility. Denies headaches, seizures, numbness, or tingling. Denies depression, anxiety or insomnia. Denies skin break down or rash.   PE  BP (!) 168/75   Pulse 84   Resp 16   Ht '5\' 5"'$  (1.651 m)   Wt 130 lb 0.6 oz (59 kg)   SpO2 92%   BMI 21.64 kg/m   Patient alert and oriented and in no cardiopulmonary distress.  HEENT: No facial asymmetry, EOMI,     Neck supple .  Chest: Clear to auscultation bilaterally.  CVS: S1, S2 no murmurs, no S3.Regular rate.  ABD: Soft non tender. Hepatosplenomegaly  Ext: No edema, cystic swellings  on both anterior legs, non tender  MS: decreased  ROM spine, shoulders, hips and knees.  Skin: Intact, no ulcerations or rash noted.  Psych: Good eye contact, normal affect. Memory intact not anxious or depressed appearing.  CNS: CN 2-12 intact, power,  normal throughout.no  focal deficits noted.   Assessment & Plan  Essential hypertension Elevated at visit, but stressed currently record review and report of home BP, no change F/u in 4 months  Depression Improved, just returned from vacation with son, and no need for therapy currently  Mildly underweight adult  Patient re-educated about  the importance of commitment to a  minimum of 150 minutes of exercise per week as able.  The importance of healthy food choices with portion control discussed, as well as eating regularly and within a 12 hour window most days. The need to choose "clean , green" food 50 to 75% of the time is discussed, as well as to make water the primary drink and set a goal of 64 ounces water daily.       08/05/2021    2:24 PM 08/04/2021    3:39 PM 07/15/2021   12:25 PM  Weight /BMI  Weight 127 lb 10.3 oz 130 lb 0.6 oz 131 lb 6.3 oz  Height 5' 4.76" (1.645 m) '5\' 5"'$  (1.651 m)   BMI 21.4 kg/m2 21.64 kg/m2 21.87 kg/m2      Generalized pain Managed through pain clinic  Insomnia secondary to depression with anxiety Sleep hygiene reviewed and written information offered also. Prescription sent for  medication needed.

## 2021-08-16 NOTE — Assessment & Plan Note (Signed)
Elevated at visit, but stressed currently record review and report of home BP, no change F/u in 4 months

## 2021-08-16 NOTE — Assessment & Plan Note (Signed)
  Patient re-educated about  the importance of commitment to a  minimum of 150 minutes of exercise per week as able.  The importance of healthy food choices with portion control discussed, as well as eating regularly and within a 12 hour window most days. The need to choose "clean , green" food 50 to 75% of the time is discussed, as well as to make water the primary drink and set a goal of 64 ounces water daily.       08/05/2021    2:24 PM 08/04/2021    3:39 PM 07/15/2021   12:25 PM  Weight /BMI  Weight 127 lb 10.3 oz 130 lb 0.6 oz 131 lb 6.3 oz  Height 5' 4.76" (1.645 m) '5\' 5"'$  (1.651 m)   BMI 21.4 kg/m2 21.64 kg/m2 21.87 kg/m2

## 2021-08-17 ENCOUNTER — Other Ambulatory Visit (HOSPITAL_BASED_OUTPATIENT_CLINIC_OR_DEPARTMENT_OTHER): Payer: Self-pay

## 2021-08-24 DIAGNOSIS — G894 Chronic pain syndrome: Secondary | ICD-10-CM | POA: Diagnosis not present

## 2021-08-24 DIAGNOSIS — M47812 Spondylosis without myelopathy or radiculopathy, cervical region: Secondary | ICD-10-CM | POA: Diagnosis not present

## 2021-08-24 DIAGNOSIS — M47816 Spondylosis without myelopathy or radiculopathy, lumbar region: Secondary | ICD-10-CM | POA: Diagnosis not present

## 2021-08-24 DIAGNOSIS — Z79891 Long term (current) use of opiate analgesic: Secondary | ICD-10-CM | POA: Diagnosis not present

## 2021-08-25 ENCOUNTER — Emergency Department (HOSPITAL_COMMUNITY): Payer: Medicare Other

## 2021-08-25 ENCOUNTER — Other Ambulatory Visit: Payer: Self-pay

## 2021-08-25 ENCOUNTER — Encounter (HOSPITAL_COMMUNITY): Payer: Self-pay | Admitting: Emergency Medicine

## 2021-08-25 ENCOUNTER — Inpatient Hospital Stay (HOSPITAL_COMMUNITY)
Admission: EM | Admit: 2021-08-25 | Discharge: 2021-08-28 | DRG: 683 | Disposition: A | Discharge status: Home / Self Care | Payer: Medicare Other | Attending: Family Medicine | Admitting: Family Medicine

## 2021-08-25 ENCOUNTER — Encounter (HOSPITAL_COMMUNITY): Payer: Self-pay

## 2021-08-25 DIAGNOSIS — R627 Adult failure to thrive: Secondary | ICD-10-CM | POA: Diagnosis present

## 2021-08-25 DIAGNOSIS — N1832 Chronic kidney disease, stage 3b: Secondary | ICD-10-CM | POA: Diagnosis present

## 2021-08-25 DIAGNOSIS — K219 Gastro-esophageal reflux disease without esophagitis: Secondary | ICD-10-CM | POA: Diagnosis not present

## 2021-08-25 DIAGNOSIS — I10 Essential (primary) hypertension: Secondary | ICD-10-CM | POA: Diagnosis present

## 2021-08-25 DIAGNOSIS — D471 Chronic myeloproliferative disease: Secondary | ICD-10-CM | POA: Diagnosis not present

## 2021-08-25 DIAGNOSIS — R0989 Other specified symptoms and signs involving the circulatory and respiratory systems: Secondary | ICD-10-CM | POA: Diagnosis present

## 2021-08-25 DIAGNOSIS — Z66 Do not resuscitate: Secondary | ICD-10-CM | POA: Diagnosis present

## 2021-08-25 DIAGNOSIS — Z885 Allergy status to narcotic agent status: Secondary | ICD-10-CM | POA: Diagnosis not present

## 2021-08-25 DIAGNOSIS — Z87891 Personal history of nicotine dependence: Secondary | ICD-10-CM | POA: Diagnosis not present

## 2021-08-25 DIAGNOSIS — F329 Major depressive disorder, single episode, unspecified: Secondary | ICD-10-CM | POA: Diagnosis not present

## 2021-08-25 DIAGNOSIS — F324 Major depressive disorder, single episode, in partial remission: Secondary | ICD-10-CM | POA: Diagnosis not present

## 2021-08-25 DIAGNOSIS — G8929 Other chronic pain: Secondary | ICD-10-CM | POA: Diagnosis not present

## 2021-08-25 DIAGNOSIS — M329 Systemic lupus erythematosus, unspecified: Secondary | ICD-10-CM | POA: Diagnosis not present

## 2021-08-25 DIAGNOSIS — D696 Thrombocytopenia, unspecified: Secondary | ICD-10-CM | POA: Diagnosis present

## 2021-08-25 DIAGNOSIS — D630 Anemia in neoplastic disease: Secondary | ICD-10-CM | POA: Diagnosis not present

## 2021-08-25 DIAGNOSIS — A419 Sepsis, unspecified organism: Secondary | ICD-10-CM | POA: Diagnosis not present

## 2021-08-25 DIAGNOSIS — Z888 Allergy status to other drugs, medicaments and biological substances status: Secondary | ICD-10-CM

## 2021-08-25 DIAGNOSIS — N179 Acute kidney failure, unspecified: Secondary | ICD-10-CM | POA: Diagnosis not present

## 2021-08-25 DIAGNOSIS — Z91030 Bee allergy status: Secondary | ICD-10-CM | POA: Diagnosis not present

## 2021-08-25 DIAGNOSIS — Z886 Allergy status to analgesic agent status: Secondary | ICD-10-CM | POA: Diagnosis not present

## 2021-08-25 DIAGNOSIS — N390 Urinary tract infection, site not specified: Secondary | ICD-10-CM | POA: Diagnosis present

## 2021-08-25 DIAGNOSIS — B9620 Unspecified Escherichia coli [E. coli] as the cause of diseases classified elsewhere: Secondary | ICD-10-CM | POA: Diagnosis present

## 2021-08-25 DIAGNOSIS — D649 Anemia, unspecified: Secondary | ICD-10-CM | POA: Diagnosis present

## 2021-08-25 DIAGNOSIS — C9590 Leukemia, unspecified not having achieved remission: Secondary | ICD-10-CM | POA: Diagnosis present

## 2021-08-25 DIAGNOSIS — I129 Hypertensive chronic kidney disease with stage 1 through stage 4 chronic kidney disease, or unspecified chronic kidney disease: Secondary | ICD-10-CM | POA: Diagnosis present

## 2021-08-25 DIAGNOSIS — M549 Dorsalgia, unspecified: Secondary | ICD-10-CM | POA: Diagnosis present

## 2021-08-25 DIAGNOSIS — F32A Depression, unspecified: Secondary | ICD-10-CM | POA: Diagnosis present

## 2021-08-25 DIAGNOSIS — R161 Splenomegaly, not elsewhere classified: Secondary | ICD-10-CM | POA: Diagnosis not present

## 2021-08-25 DIAGNOSIS — R571 Hypovolemic shock: Principal | ICD-10-CM

## 2021-08-25 DIAGNOSIS — E441 Mild protein-calorie malnutrition: Secondary | ICD-10-CM | POA: Diagnosis present

## 2021-08-25 DIAGNOSIS — E860 Dehydration: Secondary | ICD-10-CM | POA: Diagnosis present

## 2021-08-25 DIAGNOSIS — Z8249 Family history of ischemic heart disease and other diseases of the circulatory system: Secondary | ICD-10-CM

## 2021-08-25 DIAGNOSIS — K2100 Gastro-esophageal reflux disease with esophagitis, without bleeding: Secondary | ICD-10-CM | POA: Diagnosis not present

## 2021-08-25 DIAGNOSIS — Z6820 Body mass index (BMI) 20.0-20.9, adult: Secondary | ICD-10-CM

## 2021-08-25 DIAGNOSIS — Z79899 Other long term (current) drug therapy: Secondary | ICD-10-CM

## 2021-08-25 DIAGNOSIS — Z96641 Presence of right artificial hip joint: Secondary | ICD-10-CM | POA: Diagnosis present

## 2021-08-25 DIAGNOSIS — R42 Dizziness and giddiness: Secondary | ICD-10-CM | POA: Diagnosis not present

## 2021-08-25 LAB — COMPREHENSIVE METABOLIC PANEL
ALT: 20 U/L (ref 0–44)
AST: 43 U/L — ABNORMAL HIGH (ref 15–41)
Albumin: 3.6 g/dL (ref 3.5–5.0)
Alkaline Phosphatase: 137 U/L — ABNORMAL HIGH (ref 38–126)
Anion gap: 8 (ref 5–15)
BUN: 60 mg/dL — ABNORMAL HIGH (ref 8–23)
CO2: 23 mmol/L (ref 22–32)
Calcium: 7.9 mg/dL — ABNORMAL LOW (ref 8.9–10.3)
Chloride: 106 mmol/L (ref 98–111)
Creatinine, Ser: 4.48 mg/dL — ABNORMAL HIGH (ref 0.44–1.00)
GFR, Estimated: 10 mL/min — ABNORMAL LOW (ref >60)
Glucose, Bld: 134 mg/dL — ABNORMAL HIGH (ref 70–99)
Potassium: 4.4 mmol/L (ref 3.5–5.1)
Sodium: 137 mmol/L (ref 135–145)
Total Bilirubin: 0.8 mg/dL (ref 0.3–1.2)
Total Protein: 7.1 g/dL (ref 6.5–8.1)

## 2021-08-25 LAB — URINALYSIS, ROUTINE W REFLEX MICROSCOPIC
Bilirubin Urine: NEGATIVE
Glucose, UA: NEGATIVE mg/dL
Ketones, ur: NEGATIVE mg/dL
Nitrite: POSITIVE — AB
Protein, ur: 30 mg/dL — AB
Specific Gravity, Urine: 1.01 (ref 1.005–1.030)
WBC, UA: >50 WBC/hpf — ABNORMAL HIGH (ref 0–5)
pH: 7 (ref 5.0–8.0)

## 2021-08-25 LAB — PREPARE RBC (CROSSMATCH)

## 2021-08-25 LAB — PROTIME-INR
INR: 1.6 — ABNORMAL HIGH (ref 0.8–1.2)
Prothrombin Time: 18.6 seconds — ABNORMAL HIGH (ref 11.4–15.2)

## 2021-08-25 LAB — TROPONIN I (HIGH SENSITIVITY): Troponin I (High Sensitivity): 8 ng/L (ref <18)

## 2021-08-25 LAB — LACTIC ACID, PLASMA: Lactic Acid, Venous: 1.4 mmol/L (ref 0.5–1.9)

## 2021-08-25 LAB — CBC
HCT: 19.8 % — ABNORMAL LOW (ref 36.0–46.0)
Hemoglobin: 6.3 g/dL — CL (ref 12.0–15.0)
MCH: 28.9 pg (ref 26.0–34.0)
MCHC: 31.8 g/dL (ref 30.0–36.0)
MCV: 90.8 fL (ref 80.0–100.0)
Platelets: 70 10*3/uL — ABNORMAL LOW (ref 150–400)
RBC: 2.18 MIL/uL — ABNORMAL LOW (ref 3.87–5.11)
RDW: 22.7 % — ABNORMAL HIGH (ref 11.5–15.5)
WBC: 105.4 10*3/uL (ref 4.0–10.5)
nRBC: 2.3 % — ABNORMAL HIGH (ref 0.0–0.2)

## 2021-08-25 LAB — CBG MONITORING, ED: Glucose-Capillary: 126 mg/dL — ABNORMAL HIGH (ref 70–99)

## 2021-08-25 LAB — APTT: aPTT: 34 seconds (ref 24–36)

## 2021-08-25 LAB — POC OCCULT BLOOD, ED: Fecal Occult Bld: NEGATIVE

## 2021-08-25 LAB — SODIUM, URINE, RANDOM: Sodium, Ur: 81 mmol/L

## 2021-08-25 MED ORDER — SODIUM CHLORIDE 0.9 % IV SOLN
1.0000 g | INTRAVENOUS | Status: DC
  Administered 2021-08-26: 1 g via INTRAVENOUS
  Filled 2021-08-25 (×3): qty 10
  Filled 2021-08-25: qty 1

## 2021-08-25 MED ORDER — ONDANSETRON HCL 4 MG/2ML IJ SOLN: 4.0000 mg | Freq: Four times a day (QID) | INTRAMUSCULAR | Status: DC | PRN

## 2021-08-25 MED ORDER — SODIUM CHLORIDE 0.9 % IV SOLN: 25.0000 ug/min | INTRAVENOUS | Status: DC

## 2021-08-25 MED ORDER — SODIUM CHLORIDE 0.9 % IV SOLN: 250.0000 mL | INTRAVENOUS | Status: DC

## 2021-08-25 MED ORDER — MIRTAZAPINE 15 MG PO TABS
15.0000 mg | ORAL_TABLET | Freq: Every day | ORAL | Status: DC
  Administered 2021-08-25 – 2021-08-27 (×3): 15 mg via ORAL
  Filled 2021-08-25 (×3): qty 1

## 2021-08-25 MED ORDER — SODIUM CHLORIDE 0.9 % IV SOLN
25.0000 ug/min | INTRAVENOUS | Status: DC
  Filled 2021-08-25: qty 2

## 2021-08-25 MED ORDER — SODIUM CHLORIDE 0.9 % IV SOLN
10.0000 mL/h | Freq: Once | INTRAVENOUS | Status: AC
  Administered 2021-08-25: 10 mL/h via INTRAVENOUS

## 2021-08-25 MED ORDER — OXYCODONE HCL 5 MG PO TABS
5.0000 mg | ORAL_TABLET | ORAL | Status: DC | PRN
  Administered 2021-08-25 – 2021-08-28 (×5): 5 mg via ORAL
  Filled 2021-08-25 (×5): qty 1

## 2021-08-25 MED ORDER — CITALOPRAM HYDROBROMIDE 20 MG PO TABS
20.0000 mg | ORAL_TABLET | Freq: Every day | ORAL | Status: DC
  Administered 2021-08-26 – 2021-08-28 (×3): 20 mg via ORAL
  Filled 2021-08-25 (×3): qty 1

## 2021-08-25 MED ORDER — LACTATED RINGERS IV SOLN
INTRAVENOUS | Status: DC
  Administered 2021-08-25: 150 mL/h via INTRAVENOUS

## 2021-08-25 MED ORDER — VANCOMYCIN HCL 500 MG/100ML IV SOLN
500.0000 mg | INTRAVENOUS | Status: DC
  Filled 2021-08-25: qty 100

## 2021-08-25 MED ORDER — PANTOPRAZOLE SODIUM 40 MG PO TBEC
40.0000 mg | DELAYED_RELEASE_TABLET | Freq: Every day | ORAL | Status: DC
  Administered 2021-08-26 – 2021-08-28 (×3): 40 mg via ORAL
  Filled 2021-08-25 (×4): qty 1

## 2021-08-25 MED ORDER — METRONIDAZOLE 500 MG/100ML IV SOLN
500.0000 mg | Freq: Once | INTRAVENOUS | Status: AC
Start: 2021-08-25 — End: 2021-08-25
  Administered 2021-08-25: 500 mg via INTRAVENOUS
  Filled 2021-08-25: qty 100

## 2021-08-25 MED ORDER — ALLOPURINOL 300 MG PO TABS
300.0000 mg | ORAL_TABLET | Freq: Every day | ORAL | Status: DC
  Administered 2021-08-26 – 2021-08-28 (×3): 300 mg via ORAL
  Filled 2021-08-25 (×3): qty 1

## 2021-08-25 MED ORDER — LACTATED RINGERS IV BOLUS (SEPSIS)
500.0000 mL | Freq: Once | INTRAVENOUS | Status: AC
  Administered 2021-08-25: 500 mL via INTRAVENOUS

## 2021-08-25 MED ORDER — TEMAZEPAM 15 MG PO CAPS
30.0000 mg | ORAL_CAPSULE | Freq: Every evening | ORAL | Status: DC | PRN
Start: 2021-08-25 — End: 2021-08-28
  Administered 2021-08-26: 30 mg via ORAL
  Filled 2021-08-25: qty 2

## 2021-08-25 MED ORDER — SODIUM CHLORIDE 0.9 % IV SOLN
2.0000 g | Freq: Two times a day (BID) | INTRAVENOUS | Status: DC
  Administered 2021-08-25: 2 g via INTRAVENOUS
  Filled 2021-08-25: qty 12.5

## 2021-08-25 MED ORDER — LACTATED RINGERS IV BOLUS (SEPSIS)
1000.0000 mL | Freq: Once | INTRAVENOUS | Status: AC
  Administered 2021-08-25: 1000 mL via INTRAVENOUS

## 2021-08-25 MED ORDER — VANCOMYCIN HCL IN DEXTROSE 1-5 GM/200ML-% IV SOLN: 1000.0000 mg | Freq: Once | INTRAVENOUS | Status: DC

## 2021-08-25 MED ORDER — DOCUSATE SODIUM 100 MG PO CAPS
100.0000 mg | ORAL_CAPSULE | Freq: Two times a day (BID) | ORAL | Status: DC
  Administered 2021-08-26: 100 mg via ORAL
  Filled 2021-08-25 (×4): qty 1

## 2021-08-25 MED ORDER — LACTATED RINGERS IV BOLUS (SEPSIS)
250.0000 mL | Freq: Once | INTRAVENOUS | Status: AC
  Administered 2021-08-25: 250 mL via INTRAVENOUS

## 2021-08-25 MED ORDER — SODIUM CHLORIDE 0.9 % IV SOLN: 2.0000 g | Freq: Once | INTRAVENOUS | Status: DC

## 2021-08-25 MED ORDER — ALPRAZOLAM 0.5 MG PO TABS: 0.5000 mg | ORAL_TABLET | Freq: Every evening | ORAL | Status: DC | PRN

## 2021-08-25 MED ORDER — LACTATED RINGERS IV BOLUS
1000.0000 mL | Freq: Once | INTRAVENOUS | Status: AC
  Administered 2021-08-25: 1000 mL via INTRAVENOUS

## 2021-08-25 MED ORDER — ONDANSETRON HCL 4 MG PO TABS: 4.0000 mg | ORAL_TABLET | Freq: Four times a day (QID) | ORAL | Status: DC | PRN

## 2021-08-25 MED ORDER — VANCOMYCIN HCL 1250 MG/250ML IV SOLN
1250.0000 mg | Freq: Once | INTRAVENOUS | Status: AC
  Administered 2021-08-25: 1250 mg via INTRAVENOUS
  Filled 2021-08-25: qty 250

## 2021-08-25 NOTE — ED Notes (Signed)
Patient transported to CT 

## 2021-08-25 NOTE — ED Provider Notes (Signed)
[facility] EMERGENCY DEPARTMENT Provider Note   CSN: [id number] Arrival date & time: 08/25/21  1147     History  Chief Complaint  Patient presents with   Hypotension    Renee Waters is a 75 y.o. female.  HPI  Patient has history of multiple medical problems including chronic back pain, chronic neck pain, depression, GERD, hypertension, lupus, leukemia who presents today with complaints of weakness.  Patient states she has been feeling weak and dizzy for the last several days.  She denies having any vomiting or diarrhea.  She has not noticed any blood in her stool.  Patient is on oral chemotherapy treatment.  She has been taking that regularly.  She has not had any fevers.  She has been having some pain in her back but she attributes that to arthritis in her cancer and its not new for her.  Home Medications Prior to Admission medications   Medication Sig Start Date End Date Taking? Authorizing Provider  acyclovir (ZOVIRAX) 800 MG tablet Take 1 tablet (800 mg total) by mouth 5 (five) times daily. Patient taking differently: Take 800 mg by mouth 3 (three) times daily. 04/21/21  Yes [name], MD  acyclovir ointment (ZOVIRAX) 5 % Apply 1 application. topically every 3 (three) hours. 04/21/21  Yes [name], MD  allopurinol (ZYLOPRIM) 300 MG tablet Take 1 tablet by mouth once daily 07/27/21  Yes Derek Jack, MD  ALPRAZolam Duanne Moron) 0.5 MG tablet Take 1 tablet (0.5 mg total) by mouth at bedtime as needed for anxiety. 09/12/21  Yes [name], MD  citalopram (CELEXA) 20 MG tablet Take 1 tablet (20 mg total) by mouth daily. 04/21/21  Yes [name], MD  EPINEPHRINE 0.3 mg/0.3 mL IJ SOAJ injection INJECT 0.3 MLS INTO MUSCLE ONCE AS NEEDED Patient taking differently: Inject 0.3 mg into the muscle as needed for anaphylaxis. 10/04/16  Yes [name], MD  gabapentin (NEURONTIN) 300 MG capsule Take one capsule by mouth once daily, as needed, for  pain 03/30/21  Yes [name], MD  hydrochlorothiazide (HYDRODIURIL) 25 MG tablet Take 1 tablet by mouth once daily 06/28/21  Yes [name], MD  lidocaine-prilocaine (EMLA) cream Apply 1 application. topically as needed. 06/18/21  Yes Derek Jack, MD  losartan (COZAAR) 25 MG tablet Take 1 tablet by mouth once daily for blood pressure 04/26/21  Yes [name], MD  meloxicam (MOBIC) 7.5 MG tablet Take 1 tablet (7.5 mg total) by mouth daily as needed for pain (knee pain). 01/28/21  Yes Carole Civil, MD  mirtazapine (REMERON) 15 MG tablet Take 1 tablet (15 mg total) by mouth at bedtime. 06/07/21  Yes [name], MD  oxyCODONE (OXY IR/ROXICODONE) 5 MG immediate release tablet Take 5 mg by mouth every 4 (four) hours as needed. 04/22/21  Yes [provider]  pantoprazole (PROTONIX) 40 MG tablet TAKE 1 TABLET BY MOUTH ONCE DAILY BEFORE BREAKFAST 10/02/20  Yes Mahala Menghini, PA-C  potassium chloride (KLOR-CON) 10 MEQ tablet Take 1 tablet by mouth once daily 08/06/21  Yes Derek Jack, MD  ruxolitinib phosphate (JAKAFI) 15 MG tablet Take 1 tablet (15 mg total) by mouth 2 (two) times daily. 05/11/21  Yes Derek Jack, MD  temazepam (RESTORIL) 30 MG capsule Take 1 capsule (30 mg total) by mouth at bedtime as needed for sleep. 03/09/21  Yes [name], MD  tiZANidine (ZANAFLEX) 4 MG tablet Take 8 mg by mouth 3 (three) times daily  as needed (spasms). 08/05/19  Yes [name], MD  cetirizine (ZYRTEC) 10 MG tablet Take 1 tablet (10 mg total) by mouth 2 (two) times daily. Patient not taking: Reported on 08/25/2021 05/27/21   [name], MD      Allergies    Bee venom, Acetaminophen, Amlodipine, Tyloxapol, Other, and Oxycodone-acetaminophen    Review of Systems   Review of Systems  Physical Exam Updated Vital Signs BP 103/64   Pulse (!) 45   Temp 97.7 F (36.5 C) (Oral)   Resp 13   Ht 1.651 m ('5\' 5"'$ )   Wt 56.2 kg    SpO2 (!) 77%   BMI 20.63 kg/m  Physical Exam Vitals and nursing note reviewed.  Constitutional:      Appearance: She is well-developed. She is not ill-appearing or diaphoretic.  HENT:     Head: Normocephalic and atraumatic.     Right Ear: External ear normal.     Left Ear: External ear normal.  Eyes:     General: No scleral icterus.       Right eye: No discharge.        Left eye: No discharge.     Conjunctiva/sclera: Conjunctivae normal.  Neck:     Trachea: No tracheal deviation.  Cardiovascular:     Rate and Rhythm: Normal rate and regular rhythm.  Pulmonary:     Effort: Pulmonary effort is normal. No respiratory distress.     Breath sounds: Normal breath sounds. No stridor. No wheezing or rales.  Abdominal:     General: Bowel sounds are normal. There is no distension.     Palpations: Abdomen is soft.     Tenderness: There is no abdominal tenderness. There is no guarding or rebound.  Musculoskeletal:        General: No tenderness or deformity.     Cervical back: Neck supple.  Skin:    General: Skin is warm and dry.     Findings: No rash.  Neurological:     General: No focal deficit present.     Mental Status: She is alert.     Cranial Nerves: No cranial nerve deficit (no facial droop, extraocular movements intact, no slurred speech).     Sensory: No sensory deficit.     Motor: No abnormal muscle tone or seizure activity.     Coordination: Coordination normal.  Psychiatric:        Mood and Affect: Mood normal.    ED Results / Procedures / Treatments   Labs (all labs ordered are listed, but only abnormal results are displayed) Labs Reviewed  CBC - Abnormal; Notable for the following components:      Result Value   WBC 105.4 (*)    RBC 2.18 (*)    Hemoglobin 6.3 (*)    HCT 19.8 (*)    RDW 22.7 (*)    Platelets 70 (*)    nRBC 2.3 (*)    All other components within normal limits  COMPREHENSIVE METABOLIC PANEL - Abnormal; Notable for the following components:    Glucose, Bld 134 (*)    BUN 60 (*)    Creatinine, Ser 4.48 (*)    Calcium 7.9 (*)    AST 43 (*)    Alkaline Phosphatase 137 (*)    GFR, Estimated 10 (*)    All other components within normal limits  PROTIME-INR - Abnormal; Notable for the following components:   Prothrombin Time 18.6 (*)    INR 1.6 (*)  All other components within normal limits  CBG MONITORING, ED - Abnormal; Notable for the following components:   Glucose-Capillary 126 (*)    All other components within normal limits  CULTURE, BLOOD (ROUTINE X 2)  CULTURE, BLOOD (ROUTINE X 2)  URINE CULTURE  LACTIC ACID, PLASMA  APTT  URINALYSIS, ROUTINE W REFLEX MICROSCOPIC  POC OCCULT BLOOD, ED  TYPE AND SCREEN  PREPARE RBC (CROSSMATCH)  TROPONIN I (HIGH SENSITIVITY)  TROPONIN I (HIGH SENSITIVITY)    EKG EKG Interpretation  Date/Time:  Wednesday August 25 2021 13:21:52 EDT Ventricular Rate:  50 PR Interval:  200 QRS Duration: 111 QT Interval:  508 QTC Calculation: 464 R Axis:   29 Text Interpretation: Sinus rhythm Low voltage, precordial leads Confirmed by Dorie Rank [phone]) on 08/25/2021 3:45:10 PM  Radiology CT ABDOMEN PELVIS WO CONTRAST  Result Date: 08/25/2021 CLINICAL DATA:  Splenomegaly.  Dizziness.  History of leukemia. EXAM: CT ABDOMEN AND PELVIS WITHOUT CONTRAST TECHNIQUE: Multidetector CT imaging of the abdomen and pelvis was performed following the standard protocol without IV contrast. RADIATION DOSE REDUCTION: This exam was performed according to the departmental dose-optimization program which includes automated exposure control, adjustment of the mA and/or kV according to patient size and/or use of iterative reconstruction technique. COMPARISON:  04/07/2021 FINDINGS: Lower chest: Lung bases are clear except for minimal linear atelectasis or scarring. Hepatobiliary: Liver appears normal without contrast. Previous cholecystectomy. Pancreas: Normal Spleen: Spleen measures 13.9 by 10.4 x 5.4 cm today compared  with 14.3 by 10.5 by 5.6 cm previously. Splenic index is 781. Splenic index greater than 480 suggests splenomegaly. Estimated splenic volume is 483 cc. Adrenals/Urinary Tract: Adrenal glands are normal. Kidneys are normal. Bladder is normal. Some limitation in evaluation due to beam hardening from the right hip replacement. Stomach/Bowel: Stomach and small intestine are normal. Large amount of fecal matter in the colon but without evidence of obstruction or inflammation. Vascular/Lymphatic: Aortic atherosclerosis. No aneurysm. IVC is normal. No adenopathy. Reproductive: Previous hysterectomy.  No pelvic mass. Other: No free fluid or air. Musculoskeletal: Ordinary lower lumbar degenerative changes. Hip replacement on the right. Some osteoarthritis of the left hip. IMPRESSION: No acute organ finding. Splenomegaly as seen on the prior exam. Splenic index is 781. Estimated splenic volume is 483 cc. Aortic Atherosclerosis (ICD10-I70.0). Electronically Signed   By: Nelson Chimes M.D.   On: 08/25/2021 14:42   DG Chest Port 1 View  Result Date: 08/25/2021 CLINICAL DATA:  Dizziness, sepsis.  April 16, 2020. EXAM: PORTABLE CHEST 1 VIEW COMPARISON:  None Available. FINDINGS: The heart size and mediastinal contours are within normal limits. Both lungs are clear. Right internal jugular Port-A-Cath is unchanged in position. The visualized skeletal structures are unremarkable. IMPRESSION: No active disease. Electronically Signed   By: Marijo Conception M.D.   On: 08/25/2021 12:58    Procedures .Critical Care  Performed by: Dorie Rank, MD Authorized by: Dorie Rank, MD   Critical care provider statement:    Critical care time (minutes):  50   Critical care was time spent personally by me on the following activities:  Development of treatment plan with patient or surrogate, discussions with consultants, evaluation of patient's response to treatment, examination of patient, ordering and review of laboratory studies, ordering  and review of radiographic studies, ordering and performing treatments and interventions, pulse oximetry, re-evaluation of patient's condition and review of old charts     Medications Ordered in ED Medications  lactated ringers infusion (150 mL/hr Intravenous New Bag/Given 08/25/21 1500)  vancomycin (VANCOREADY) IVPB 1250 mg/250 mL (has no administration in time range)  0.9 %  sodium chloride infusion (has no administration in time range)  phenylephrine (NEO-SYNEPHRINE) 20 mg in sodium chloride 0.9 % 250 mL (0.08 mg/mL) infusion (has no administration in time range)  ceFEPIme (MAXIPIME) 1 g in sodium chloride 0.9 % 100 mL IVPB (has no administration in time range)  vancomycin (VANCOREADY) IVPB 500 mg/100 mL (has no administration in time range)  lactated ringers bolus 1,000 mL (has no administration in time range)  lactated ringers bolus 1,000 mL (0 mLs Intravenous Stopped 08/25/21 1315)    And  lactated ringers bolus 500 mL (0 mLs Intravenous Stopped 08/25/21 1354)    And  lactated ringers bolus 250 mL (0 mLs Intravenous Stopped 08/25/21 1346)  metroNIDAZOLE (FLAGYL) IVPB 500 mg (0 mg Intravenous Stopped 08/25/21 1457)  0.9 %  sodium chloride infusion (10 mL/hr Intravenous New Bag/Given 08/25/21 1339)    ED Course/ Medical Decision Making/ A&P Clinical Course as of 08/25/21 1612  Wed Aug 25, 2021  1319 Blood pressure remains low.  Fluids have been ordered.  We will start peripheral pressors [JK]  1321 CBC(!!) Hemoglobin decreased compared to previous.  White count elevated but similar to previous [JK]  1424 Comprehensive metabolic panel(!) New onset renal failure noted.  We will give an additional fluid bolus [JK]  1534 Blood pressure is now improved to 103/64 [JK]  1547 Case discussed with Dr Waldron Labs [JK]    Clinical Course User Index [JK] Dorie Rank, MD                           Medical Decision Making Patient with severe hypotension.  Surprisingly does not appear in severe  distress.  Differential diagnosis includes but not limited to hypovolemic shock, hemorrhagic shock, sepsis.  Sepsis protocol initiated.  Fluid resuscitation ordered.  Problems Addressed: AKI (acute kidney injury) [facility]): acute illness or injury that poses a threat to life or bodily functions Anemia, unspecified type: chronic illness or injury with exacerbation, progression, or side effects of treatment Hypovolemic shock ([place]): acute illness or injury that poses a threat to life or bodily functions Myeloproliferative neoplasm [facility]): chronic illness or injury with exacerbation, progression, or side effects of treatment  Amount and/or Complexity of Data Reviewed Labs: ordered. Decision-making details documented in ED Course. Radiology: ordered. ECG/medicine tests: ordered.  Risk Prescription drug management. Decision regarding hospitalization.   Pt presented with profound hypotension weakness, known myeloproliferative disorder.  Treated empirically for possible sepsis although initial ed workup without signs of definitive infection.  No fever, no lactic acidosis.  Found to have new AKI with significant elevation in creatinine.  Pressors considered after limited initial response to IV fluids but ultimately bp improved with iv fluids.    CT scan without signs of acute hemorrhage, no obstruction, infection.  Splenomegally unchanged.  Hypotension appears to be related to dehydration, volume depletion,  Worsening anemia with her Myeloproliferative disorder.  Blood transfusions ordered.  Dr Waldron Labs discussed case with Dr Raliegh Ip.  Pt admitted to the hospital for further treatment.        Final Clinical Impression(s) / ED Diagnoses Final diagnoses:  Hypovolemic shock ([place])  AKI (acute kidney injury) ([place])  Myeloproliferative neoplasm ([place])  Anemia, unspecified type    Rx / DC Orders ED Discharge Orders     None         Dorie Rank, MD 08/28/21 [phone]

## 2021-08-25 NOTE — Sepsis Progress Note (Signed)
Code Sepsis protocol being monitored by eLink. 

## 2021-08-25 NOTE — ED Notes (Addendum)
Attempted to scan blood unit.  Computer system said unit numbers did not match.  Unit of blood check by 3 staff.  Contacted blood bank and staff came to nurses station to verify.  Advised that since the blood came from another facility to use the procedure we would use for downtime.    Blood transfusion started at 2010.  Unit # [id number] J Component: Red Cells, LR  ABO/Rh: 0-POS Volume: 315 Expiration Date: 09/01/2021 Expiration Time: 23:59 Unit Antigens: Negative for KIDD A antigen negative for kell antigen  Confirmed by: Ginger Pruitt before administration.

## 2021-08-25 NOTE — Progress Notes (Signed)
Pharmacy Antibiotic Note  Renee Waters is a 76 y.o. female admitted on 08/25/2021 with  unknown source of infection .  Pharmacy has been consulted for vancomycin and cefepime dosing.  Plan: Vancomycin 1250 mg IV x 1 dose. Vancomycin 500 mg IV every 48 hours. Cefepime 2000 mg IV x 1 followed by cefepime 1000 mg IV every 24 hours. Monitor labs, c/s, and vanco level as indicated.  Height: '5\' 5"'$  (165.1 cm) Weight: 56.2 kg (124 lb) IBW/kg (Calculated) : 57  Temp (24hrs), Avg:97.7 F (36.5 C), Min:97.7 F (36.5 C), Max:97.7 F (36.5 C)  Recent Labs  Lab 08/25/21 1204 08/25/21 1233  WBC 105.4*  --   CREATININE  --  4.48*  LATICACIDVEN  --  1.4    Estimated Creatinine Clearance: 9.6 mL/min (A) (by C-G formula based on SCr of 4.48 mg/dL (H)).    Allergies  Allergen Reactions   Bee Venom Swelling and Hives   Acetaminophen     itches   Amlodipine     Hair loss   Tyloxapol    Other Itching, Rash and Swelling   Oxycodone-Acetaminophen Rash    Pt states, "this gives her a rash, but at home she takes oxycodone for pain relief" Pt states, "this gives her a rash, but at home she takes oxycodone for pain relief"    Antimicrobials this admission: Vanco 7/19 >> Cefepime 7/19 >> Flagyl 7/19   Microbiology results: 7/19 BCx: pending 7/19 UCx: pending    Thank you for allowing pharmacy to be a part of this patient's care.  Margot Ables, PharmD Clinical Pharmacist 08/25/2021 2:16 PM

## 2021-08-25 NOTE — ED Notes (Signed)
Spoke with pharmacy, Donna Christen Coffee, per conversation the pt is to receive Vancomycin 1250 mg and then in 2 days the pt will be started on a different dose

## 2021-08-25 NOTE — ED Triage Notes (Signed)
Pt presents with dizziness x 4 days, PMH of Leukemia, hypotensive in triage.

## 2021-08-25 NOTE — Progress Notes (Signed)
Call received from patient's husband and patient's daughter stating that the patient has been very weak, fatigued, had difficulty walking with no appetite worsening over the past several days. Patient's husband tells me that she has not been able to stay awake as well. Patient's husband reports that she has some degree of these symptoms when she needs a blood transfusion but that it is not to this severity. He states he is unsure of how to safely transport her to [facility]. Advised patient's husband to have the patient taken to the Emergency Department for evaluation. Patient's husband verbalized understanding. Report called to Lura Em, ED Charge RN.

## 2021-08-25 NOTE — H&P (Signed)
TRH H&P   Patient Demographics:    Renee Waters, is a 76 y.o. female  MRN: [id number]   DOB - 08-Dec-1945  Admit Date - 08/25/2021  Outpatient Primary MD for the patient is [name], MD  Referring MD/NP/PA: Tomi Bamberger  Outpatient Specialists: [name]  Patient coming from: home  Chief Complaint  Patient presents with   Hypotension      HPI:    Renee Waters  is a 76 y.o. female, with PMH of MPL positive myeloproliferative neoplasm, HTN, lupus, MDD, GERD. -Patient presents to multiple complaints to ED, reports generalized weakness, fatigue, lack of energy, dizziness, for last few days, she reports strong color in her urine, she denies any nausea, vomiting or diarrhea, she denies any coffee-ground emesis, no bright red blood per rectum, no fever, no chills, she is on oral JAKAI for MPL positive myeloproliferative neoplasm, patient reports she required frequent transfusions due to anemia from her MPL, denies cough, chest pain or sick contacts. - in ED significant for white blood cell 105K, baseline around 98, hemoglobin was low at 6.3, her baseline between 7-8, creatinine was elevated at 4.48, baseline around 1.5, CT  abdomen pelvis significant for hepatosplenomegaly, she was ordered 2 units PRBC transfusion, and [facility] consulted to admit    Review of systems:      A full 10 point Review of Systems was done, except as stated above, all other Review of Systems were negative.   With Past History of the following :    Past Medical History:  Diagnosis Date   Acute cholangitis    Allergy    Anemia    Anxiety    Arthritis    Phreesia 03/18/2020   Barrett's esophagus    Cataract    Chronic back pain    Chronic neck pain    CKD (chronic kidney disease) stage 3, GFR 30-59 ml/min ([place]) 10/29/2020   Depression    Genital herpes    GERD  (gastroesophageal reflux disease)    H/O degenerative disc disease    History of blood transfusion    Hypertension    Insomnia    Lupus (systemic lupus erythematosus) ([place])    Neuromuscular disorder ([place])    Osteoarthritis    S/P colonoscopy June 2005   normal, no polyps   S/P endoscopy June 2005, Oct 2009   2005: short-segment Barrett's, 2009: short-segment Barrett's   Upper respiratory tract infection 07/09/2020   UTI (lower urinary tract infection) 11/2012      Past Surgical History:  Procedure Laterality Date   ABDOMINAL HYSTERECTOMY     BACK SURGERY     BIOPSY N/A 03/20/2014   Procedure: BIOPSY;  Surgeon: Daneil Dolin, MD;  Location: [facility];  Service: Endoscopy;  Laterality: N/A;   BIOPSY  09/14/2015   Procedure: BIOPSY;  Surgeon: Daneil Dolin, MD;  Location: [facility];  Service: Endoscopy;;  esophageal and gastric   BIOPSY  07/23/2019   Procedure: BIOPSY;  Surgeon: Daneil Dolin, MD;  Location: [facility];  Service: Endoscopy;;  esophageal    CARPAL TUNNEL RELEASE Left 2013   cervical disectomy  2002   CESAREAN SECTION N/A    Phreesia 03/18/2020   CHOLECYSTECTOMY     with lysis of adhesions for sbo; "ruptured gallbladder".   COLONOSCOPY  11/09/2011   RMR: Melanosis coli   COLONOSCOPY WITH PROPOFOL N/A 09/14/2015   Dr. Gala Romney: diverticulosis, 32m TA removed. next TCS 09/2020.    COLONOSCOPY WITH PROPOFOL N/A 04/30/2020   Procedure: COLONOSCOPY WITH PROPOFOL;  Surgeon: RDaneil Dolin MD;  [facility];  Service: Endoscopy;  Laterality: N/A;  PM (ASA 3)   DENTAL SURGERY  11/2015   multiple tooth extraction   ESOPHAGOGASTRODUODENOSCOPY  11/29/2007   salmon-colored  tongue   longest stable at  3 cm, distal esophagus as described previously status post biopsy/ Hiatal hernia, otherwise normal stomach D1 and D2   ESOPHAGOGASTRODUODENOSCOPY  01/06/11   short segment Barrett's esophagus s/p bx/Hiatal hernia   ESOPHAGOGASTRODUODENOSCOPY (EGD) WITH PROPOFOL N/A  03/20/2014   RFAO:ZHYQMVHQdistal esophagus short segment barrett's, bx with no dysplasia. next egd in 03/2017   ESOPHAGOGASTRODUODENOSCOPY (EGD) WITH PROPOFOL N/A 09/14/2015   Dr. RGala Romney Barrett's without dysplasia, gastritis benign bx, hiatal hernia. next EGD 09/2018.   ESOPHAGOGASTRODUODENOSCOPY (EGD) WITH PROPOFOL N/A 07/23/2019   Procedure: ESOPHAGOGASTRODUODENOSCOPY (EGD) WITH PROPOFOL;  Surgeon: RDaneil Dolin MD;  [facility];  Service: Endoscopy;  Laterality: N/A;  3:00pm   EYE SURGERY N/A    Phreesia 03/18/2020   HERNIA REPAIR Right 07/2010   Dr. BZada Girt  IR IMAGING GUIDED PORT INSERTION  02/09/2021   JOINT REPLACEMENT     [name]  POLYPECTOMY  09/14/2015   Procedure: POLYPECTOMY;  Surgeon: RDaneil Dolin MD;  [facility]: [facility];  Service: Endoscopy;;  ascending colon   right hip replacement  07/2010   went back in sept 2012 to fix   SHOULDER ARTHROSCOPY  2008   left   SPINE SURGERY N/A    Phreesia 03/18/2020   TOTAL HIP REVISION Right 12/17/2012   Procedure: RIGHT TOTAL HIP REVISION;  Surgeon: MMauri Pole MD;  Location: [facility];  Service: Orthopedics;  Laterality: Right;   WRIST SURGERY Right 2011   open reduction right wrist.      Social History:     Social History   Tobacco Use   Smoking status: Former    Packs/day: 0.25    Years: 25.00    Total pack years: 6.25    Types: Cigarettes    Quit date: 02/07/2003    Years since quitting: 18.5   Smokeless tobacco: Never   Tobacco comments:    quit in 2004  Substance Use Topics   Alcohol use: No        Family History :     Family History  Problem Relation Age of Onset   Hypertension Mother    Stroke Mother    Colon cancer Neg Hx    Anesthesia problems Neg Hx    Hypotension Neg Hx    Malignant hyperthermia Neg Hx    Pseudochol deficiency Neg Hx    Gastric cancer Neg Hx    Esophageal cancer Neg Hx      Home Medications:   Prior to Admission  medications   Medication Sig  Start Date End Date Taking? Authorizing Provider  acyclovir (ZOVIRAX) 800 MG tablet Take 1 tablet (800 mg total) by mouth 5 (five) times daily. Patient taking differently: Take 800 mg by mouth 3 (three) times daily. 04/21/21  Yes [name], MD  acyclovir ointment (ZOVIRAX) 5 % Apply 1 application. topically every 3 (three) hours. 04/21/21  Yes [name], MD  allopurinol (ZYLOPRIM) 300 MG tablet Take 1 tablet by mouth once daily 07/27/21  Yes Derek Jack, MD  ALPRAZolam Duanne Moron) 0.5 MG tablet Take 1 tablet (0.5 mg total) by mouth at bedtime as needed for anxiety. 09/12/21  Yes [name], MD  citalopram (CELEXA) 20 MG tablet Take 1 tablet (20 mg total) by mouth daily. 04/21/21  Yes [name], MD  EPINEPHRINE 0.3 mg/0.3 mL IJ SOAJ injection INJECT 0.3 MLS INTO MUSCLE ONCE AS NEEDED Patient taking differently: Inject 0.3 mg into the muscle as needed for anaphylaxis. 10/04/16  Yes [name], MD  gabapentin (NEURONTIN) 300 MG capsule Take one capsule by mouth once daily, as needed, for pain 03/30/21  Yes [name], MD  hydrochlorothiazide (HYDRODIURIL) 25 MG tablet Take 1 tablet by mouth once daily 06/28/21  Yes [name], MD  lidocaine-prilocaine (EMLA) cream Apply 1 application. topically as needed. 06/18/21  Yes Derek Jack, MD  losartan (COZAAR) 25 MG tablet Take 1 tablet by mouth once daily for blood pressure 04/26/21  Yes [name], MD  meloxicam (MOBIC) 7.5 MG tablet Take 1 tablet (7.5 mg total) by mouth daily as needed for pain (knee pain). 01/28/21  Yes Carole Civil, MD  mirtazapine (REMERON) 15 MG tablet Take 1 tablet (15 mg total) by mouth at bedtime. 06/07/21  Yes [name], MD  oxyCODONE (OXY IR/ROXICODONE) 5 MG immediate release tablet Take 5 mg by mouth every 4 (four) hours as needed. 04/22/21  Yes [provider]  pantoprazole (PROTONIX) 40 MG tablet  TAKE 1 TABLET BY MOUTH ONCE DAILY BEFORE BREAKFAST 10/02/20  Yes Mahala Menghini, PA-C  potassium chloride (KLOR-CON) 10 MEQ tablet Take 1 tablet by mouth once daily 08/06/21  Yes Derek Jack, MD  ruxolitinib phosphate (JAKAFI) 15 MG tablet Take 1 tablet (15 mg total) by mouth 2 (two) times daily. 05/11/21  Yes Derek Jack, MD  temazepam (RESTORIL) 30 MG capsule Take 1 capsule (30 mg total) by mouth at bedtime as needed for sleep. 03/09/21  Yes [name], MD  tiZANidine (ZANAFLEX) 4 MG tablet Take 8 mg by mouth 3 (three) times daily as needed (spasms). 08/05/19  Yes [name], MD  cetirizine (ZYRTEC) 10 MG tablet Take 1 tablet (10 mg total) by mouth 2 (two) times daily. Patient not taking: Reported on 08/25/2021 05/27/21   [name], MD     Allergies:     Allergies  Allergen Reactions   Bee Venom Swelling and Hives   Acetaminophen     itches   Amlodipine     Hair loss   Tyloxapol    Other Itching, Rash and Swelling   Oxycodone-Acetaminophen Rash    Pt states, "this gives her a rash, but at home she takes oxycodone for pain relief" Pt states, "this gives her a rash, but at home she takes oxycodone for pain relief"     Physical Exam:   Vitals  Blood pressure 103/64, pulse (!) 55, temperature 97.7 F (36.5 C), temperature source Oral, resp. rate 10, height '5\' 5"'$  (1.651 m), weight  56.2 kg, SpO2 99 %.   1. General frail elderly female lying in bed in NAD,   2. Normal affect and insight, Not Suicidal or Homicidal, Awake Alert, Oriented X 3.  3. No F.N deficits, ALL C.Nerves Intact, Strength 5/5 all 4 extremities, Sensation intact all 4 extremities, Plantars down going.  4. Ears and Eyes appear Normal, Conjunctivae clear, PERRLA. Dry Oral Mucosa.  5. Supple Neck, No JVD, No cervical lymphadenopathy appriciated, No Carotid Bruits.  6. Symmetrical Chest wall movement, Good air movement bilaterally, CTAB.  7. RRR, No Gallops, Rubs or  Murmurs, No Parasternal Heave.  8. Positive Bowel Sounds, Abdomen Soft, No tenderness, hepatosplenomegaly present  9.  No Cyanosis, Normal Skin Turgor, No Skin Rash or Bruise.  10. Good muscle tone,  joints appear normal , no effusions, Normal ROM.  11. No Palpable Lymph Nodes in Neck or Axillae     Data Review:    CBC Recent Labs  Lab 08/25/21 1204  WBC 105.4*  HGB 6.3*  HCT 19.8*  PLT 70*  MCV 90.8  MCH 28.9  MCHC 31.8  RDW 22.7*   ------------------------------------------------------------------------------------------------------------------  Chemistries  Recent Labs  Lab 08/25/21 1233  NA 137  K 4.4  CL 106  CO2 23  GLUCOSE 134*  BUN 60*  CREATININE 4.48*  CALCIUM 7.9*  AST 43*  ALT 20  ALKPHOS 137*  BILITOT 0.8   ------------------------------------------------------------------------------------------------------------------ estimated creatinine clearance is 9.6 mL/min (A) (by C-G formula based on SCr of 4.48 mg/dL (H)). ------------------------------------------------------------------------------------------------------------------ No results for input(s): "TSH", "T4TOTAL", "T3FREE", "THYROIDAB" in the last 72 hours.  Invalid input(s): "FREET3"  Coagulation profile Recent Labs  Lab 08/25/21 1233  INR 1.6*   ------------------------------------------------------------------------------------------------------------------- No results for input(s): "DDIMER" in the last 72 hours. -------------------------------------------------------------------------------------------------------------------  Cardiac Enzymes No results for input(s): "CKMB", "TROPONINI", "MYOGLOBIN" in the last 168 hours.  Invalid input(s): "CK" ------------------------------------------------------------------------------------------------------------------ No results found for:  "BNP"   ---------------------------------------------------------------------------------------------------------------  Urinalysis    Component Value Date/Time   COLORURINE YELLOW 11/06/2020 1847   APPEARANCEUR CLEAR 11/06/2020 1847   LABSPEC 1.012 11/06/2020 1847   PHURINE 6.0 11/06/2020 1847   GLUCOSEU NEGATIVE 11/06/2020 1847   GLUCOSEU NEG mg/dL 05/26/2009 2225   HGBUR NEGATIVE 11/06/2020 1847   HGBUR negative 08/19/2008 1124   BILIRUBINUR NEGATIVE 11/06/2020 1847   BILIRUBINUR neg 08/15/2017 0955   KETONESUR NEGATIVE 11/06/2020 1847   PROTEINUR 30 (A) 11/06/2020 1847   UROBILINOGEN 1.0 08/15/2017 0955   UROBILINOGEN 0.2 12/05/2012 1150   NITRITE NEGATIVE 11/06/2020 1847   LEUKOCYTESUR TRACE (A) 11/06/2020 1847    ----------------------------------------------------------------------------------------------------------------   Imaging Results:    CT ABDOMEN PELVIS WO CONTRAST  Result Date: 08/25/2021 CLINICAL DATA:  Splenomegaly.  Dizziness.  History of leukemia. EXAM: CT ABDOMEN AND PELVIS WITHOUT CONTRAST TECHNIQUE: Multidetector CT imaging of the abdomen and pelvis was performed following the standard protocol without IV contrast. RADIATION DOSE REDUCTION: This exam was performed according to the departmental dose-optimization program which includes automated exposure control, adjustment of the mA and/or kV according to patient size and/or use of iterative reconstruction technique. COMPARISON:  04/07/2021 FINDINGS: Lower chest: Lung bases are clear except for minimal linear atelectasis or scarring. Hepatobiliary: Liver appears normal without contrast. Previous cholecystectomy. Pancreas: Normal Spleen: Spleen measures 13.9 by 10.4 x 5.4 cm today compared with 14.3 by 10.5 by 5.6 cm previously. Splenic index is 781. Splenic index greater than 480 suggests splenomegaly. Estimated splenic volume is 483 cc. Adrenals/Urinary Tract: Adrenal glands are normal. Kidneys are normal.  Bladder is normal. Some limitation in evaluation due to beam hardening from the right hip replacement. Stomach/Bowel: Stomach and small intestine are normal. Large amount of fecal matter in the colon but without evidence of obstruction or inflammation. Vascular/Lymphatic: Aortic atherosclerosis. No aneurysm. IVC is normal. No adenopathy. Reproductive: Previous hysterectomy.  No pelvic mass. Other: No free fluid or air. Musculoskeletal: Ordinary lower lumbar degenerative changes. Hip replacement on the right. Some osteoarthritis of the left hip. IMPRESSION: No acute organ finding. Splenomegaly as seen on the prior exam. Splenic index is 781. Estimated splenic volume is 483 cc. Aortic Atherosclerosis (ICD10-I70.0). Electronically Signed   By: Nelson Chimes M.D.   On: 08/25/2021 14:42   DG Chest Port 1 View  Result Date: 08/25/2021 CLINICAL DATA:  Dizziness, sepsis.  April 16, 2020. EXAM: PORTABLE CHEST 1 VIEW COMPARISON:  None Available. FINDINGS: The heart size and mediastinal contours are within normal limits. Both lungs are clear. Right internal jugular Port-A-Cath is unchanged in position. The visualized skeletal structures are unremarkable. IMPRESSION: No active disease. Electronically Signed   By: Marijo Conception M.D.   On: 08/25/2021 12:58    My personal review of EKG: Rhythm is bradycardia, with QTC of 464   Assessment & Plan:    Principal Problem:   AKI (acute kidney injury) ([place]) Active Problems:   Depression   Essential hypertension   GERD (gastroesophageal reflux disease)   Systemic lupus ([facility])   AKI in CKD stage III -Baseline creatinine is 1.3, currently 4.48, this appears to be prerenal/hypovolemia with BUN of 60, currently dehydrated, hypotensive and with profound anemia. -We will check bladder scan to ensure there is no urinary retention(she has no suprapubic fullness or tenderness). -Continue with IV fluids -Check UA. -Check urine sodium -Continue with IV fluids -Avoid  nephrotoxic medications -Hold Cozaar  Hypotension -Likely due to volume depletion and dehydration, but infection is not ruled out yet, but lactic acid is reassuring -Continue with a broad-spectrum antibiotic for next 24 hours pending septic work-up. -Continue with IV fluids -Hold home antihypertensive medications  MPL positive myeloproliferative neoplasm Leukocytosis Thrombocytopenia Anemia -She is followed by [name] -He is with anemia with hemoglobin of 6.3, requiring frequent transfusions, discussed with [name], recommendation for 2 units PRBC transfusion, will check Hemoccult to rule out GI bleed, she denies any blood with the stools. -Thrombocytopenia at baseline, will keep on SCD for DVT prophylaxis. -Continue with allopurinol - she  is on Ruxolitinib, will hold till she is more stable.  Failure to thrive/protein calorie malnutrition/deconditioning Body mass index is 20.63 kg/m. -Continue with Remeron, will start on Ensure -Consult PT/OT  Depression -Continue with home medications   DVT Prophylaxis SCDs   AM Labs Ordered, also please review Full Orders  Family Communication: Admission, patients condition and plan of care including tests being ordered have been discussed with the patient and Husband  who indicate understanding and agree with the plan and Code Status.  Code Status DNR   Likely DC to  home  Condition GUARDED    Consults called: D/W Dr Jamey Reas    Admission status: inpatient    Time spent in minutes : 75 minutes   Phillips Climes M.D on 08/25/2021 at [place] PM   [facility] - Office  [phone]

## 2021-08-25 NOTE — ED Provider Notes (Signed)
  Physical Exam  BP 103/64   Pulse (!) 45   Temp 97.7 F (36.5 C) (Oral)   Resp 13   Ht '5\' 5"'$  (1.651 m)   Wt 56.2 kg   SpO2 (!) 77%   BMI 20.63 kg/m   Physical Exam  Procedures  Procedures  ED Course / MDM   Clinical Course as of 08/25/21 1602  Wed Aug 25, 2021  1319 Blood pressure remains low.  Fluids have been ordered.  We will start peripheral pressors [JK]  1321 CBC(!!) Hemoglobin decreased compared to previous.  White count elevated but similar to previous [JK]  1424 Comprehensive metabolic panel(!) New onset renal failure noted.  We will give an additional fluid bolus [JK]  1534 Blood pressure is now improved to 103/64 [JK]  1547 Case discussed with Dr Waldron Labs [JK]    Clinical Course User Index [JK] Dorie Rank, MD   Medical Decision Making Amount and/or Complexity of Data Reviewed Labs: ordered. Decision-making details documented in ED Course. Radiology: ordered. ECG/medicine tests: ordered.  Risk Prescription drug management. Decision regarding hospitalization.   Pt being admitted for severe anemia, symptomatic and renal failure. Initially hypotensive, now BP has improved. Medicine to admit.        Varney Biles, MD 08/25/21 [phone]

## 2021-08-25 NOTE — ED Notes (Signed)
Date and time results received: 08/25/21 1344 (use smartphrase ".now" to insert current time)  Test: WBC & Hgb Critical Value: 105.4 WBC 6.3 Hgb  Name of Provider Notified: Dr. Tomi Bamberger  Orders Received? Or Actions Taken?: see chart

## 2021-08-25 NOTE — ED Notes (Signed)
Phlebotomy unable to collect troponin, x 1 attempt and the pt declined being stuck again, MD notified

## 2021-08-26 ENCOUNTER — Encounter (HOSPITAL_COMMUNITY): Payer: Self-pay | Admitting: Family Medicine

## 2021-08-26 DIAGNOSIS — N179 Acute kidney failure, unspecified: Secondary | ICD-10-CM | POA: Diagnosis not present

## 2021-08-26 DIAGNOSIS — K2100 Gastro-esophageal reflux disease with esophagitis, without bleeding: Secondary | ICD-10-CM

## 2021-08-26 DIAGNOSIS — D649 Anemia, unspecified: Secondary | ICD-10-CM | POA: Diagnosis present

## 2021-08-26 DIAGNOSIS — I10 Essential (primary) hypertension: Secondary | ICD-10-CM | POA: Diagnosis not present

## 2021-08-26 DIAGNOSIS — M329 Systemic lupus erythematosus, unspecified: Secondary | ICD-10-CM

## 2021-08-26 LAB — BPAM RBC
Blood Product Expiration Date: 202307262359
Blood Product Expiration Date: 202307262359
ISSUE DATE / TIME: 202307192329
ISSUE DATE / TIME: 202307192329
Unit Type and Rh: 5100
Unit Type and Rh: 5100

## 2021-08-26 LAB — BASIC METABOLIC PANEL
Anion gap: 8 (ref 5–15)
BUN: 43 mg/dL — ABNORMAL HIGH (ref 8–23)
CO2: 22 mmol/L (ref 22–32)
Calcium: 8.3 mg/dL — ABNORMAL LOW (ref 8.9–10.3)
Chloride: 109 mmol/L (ref 98–111)
Creatinine, Ser: 2.43 mg/dL — ABNORMAL HIGH (ref 0.44–1.00)
GFR, Estimated: 20 mL/min — ABNORMAL LOW (ref >60)
Glucose, Bld: 72 mg/dL (ref 70–99)
Potassium: 4.2 mmol/L (ref 3.5–5.1)
Sodium: 139 mmol/L (ref 135–145)

## 2021-08-26 LAB — CBC
HCT: 26.8 % — ABNORMAL LOW (ref 36.0–46.0)
Hemoglobin: 9 g/dL — ABNORMAL LOW (ref 12.0–15.0)
MCH: 29.8 pg (ref 26.0–34.0)
MCHC: 33.6 g/dL (ref 30.0–36.0)
MCV: 88.7 fL (ref 80.0–100.0)
Platelets: 72 10*3/uL — ABNORMAL LOW (ref 150–400)
RBC: 3.02 MIL/uL — ABNORMAL LOW (ref 3.87–5.11)
RDW: 19.1 % — ABNORMAL HIGH (ref 11.5–15.5)
WBC: 100.3 10*3/uL (ref 4.0–10.5)
nRBC: 3.2 % — ABNORMAL HIGH (ref 0.0–0.2)

## 2021-08-26 LAB — TYPE AND SCREEN
ABO/RH(D): O POS
Antibody Screen: POSITIVE
DAT, IgG: NEGATIVE
Donor AG Type: NEGATIVE
Donor AG Type: NEGATIVE
Unit division: 0
Unit division: 0

## 2021-08-26 MED ORDER — LACTATED RINGERS IV SOLN: INTRAVENOUS | Status: DC

## 2021-08-26 MED ORDER — ACETAMINOPHEN 325 MG PO TABS
650.0000 mg | ORAL_TABLET | Freq: Once | ORAL | Status: AC
  Administered 2021-08-26: 650 mg via ORAL
  Filled 2021-08-26: qty 2

## 2021-08-26 MED ORDER — CHLORHEXIDINE GLUCONATE CLOTH 2 % EX PADS
6.0000 | MEDICATED_PAD | Freq: Every day | CUTANEOUS | Status: DC
Start: 2021-08-26 — End: 2021-08-28
  Administered 2021-08-26 – 2021-08-27 (×2): 6 via TOPICAL

## 2021-08-26 MED ORDER — METOPROLOL TARTRATE 25 MG PO TABS
25.0000 mg | ORAL_TABLET | Freq: Two times a day (BID) | ORAL | Status: DC
  Administered 2021-08-26 – 2021-08-28 (×5): 25 mg via ORAL
  Filled 2021-08-26 (×5): qty 1

## 2021-08-26 MED ORDER — VANCOMYCIN HCL 750 MG/150ML IV SOLN
750.0000 mg | INTRAVENOUS | Status: DC
  Filled 2021-08-26: qty 150

## 2021-08-26 MED ORDER — DIPHENHYDRAMINE HCL 50 MG/ML IJ SOLN
25.0000 mg | Freq: Once | INTRAMUSCULAR | Status: AC
  Administered 2021-08-26: 25 mg via INTRAVENOUS
  Filled 2021-08-26: qty 1

## 2021-08-26 MED ORDER — ACETAMINOPHEN 325 MG PO TABS
650.0000 mg | ORAL_TABLET | Freq: Four times a day (QID) | ORAL | Status: DC | PRN
  Administered 2021-08-26: 650 mg via ORAL
  Filled 2021-08-26: qty 2

## 2021-08-26 NOTE — Care Management Important Message (Signed)
Important Message  Patient Details  Name: Renee Waters MRN: [id number] Date of Birth: 09/26/1945   Medicare Important Message Given:  N/A - LOS <3 / Initial given by admissions     Dannette Barbara 08/26/2021, 5:20 PM

## 2021-08-26 NOTE — Progress Notes (Signed)
PROGRESS NOTE   Renee Waters  JJH:[id number] DOB: Jan 21, 1946 DOA: 08/25/2021 PCP: [name], MD   Chief Complaint  Patient presents with   Hypotension   Level of care: Telemetry  Brief Admission History:  76 y.o. female, with PMH of MPL positive myeloproliferative neoplasm, HTN, lupus, MDD, GERD. -Patient presents to multiple complaints to ED, reports generalized weakness, fatigue, lack of energy, dizziness, for last few days, she reports strong color in her urine, she denies any nausea, vomiting or diarrhea, she denies any coffee-ground emesis, no bright red blood per rectum, no fever, no chills, she is on oral JAKAI for MPL positive myeloproliferative neoplasm, patient reports she required frequent transfusions due to anemia from her MPL, denies cough, chest pain or sick contacts. - in ED significant for white blood cell 105K, baseline around 98, hemoglobin was low at 6.3, her baseline between 7-8, creatinine was elevated at 4.48, baseline around 1.5, CT  abdomen pelvis significant for hepatosplenomegaly, she was ordered 2 units PRBC transfusion, and [facility] hospitalist consulted to admit   Assessment and Plan:   AKI in CKD stage IIIb -Baseline creatinine is 1.3, currently 4.48, this appears to be prerenal/hypovolemia with BUN of 60, currently dehydrated, hypotensive and with profound anemia. -We will check bladder scan to ensure there is no urinary retention(she has no suprapubic fullness or tenderness). -Continue with IV fluids -Follow up UA. -Avoid nephrotoxic medications -Holding losartan in setting of AKI   Hypotension - resolved -Likely due to volume depletion and dehydration, but infection is not ruled out yet, but lactic acid is reassuring -Continue with a broad-spectrum antibiotic for next 24 hours pending septic work-up. -Continue with IV fluids -temporarily held home antihypertensive medications but can start to resume as BP rebounds  Essential hypertension -  BP is rebounding now up to 170 SBP - metoprolol 25 mg BID started while losartan on hold due to AKI   MPL positive myeloproliferative neoplasm Leukocytosis Thrombocytopenia Anemia -She is followed by [name] -He is with anemia with hemoglobin of 6.3, requiring frequent transfusions, discussed with [name], recommendation for 2 units PRBC transfusion, will check Hemoccult to rule out GI bleed, she denies any blood with the stools. -Hg up to 9.0 after PRBC transfusion -Thrombocytopenia at baseline, will keep on SCD for DVT prophylaxis. -Continue with allopurinol - she  is on Ruxolitinib, will hold till she is more stable.   Failure to thrive/protein calorie malnutrition/deconditioning Body mass index is 20.63 kg/m. -Continue with Remeron, will start on Ensure -Consult PT/OT   Depression -Continue with home medications    DVT Prophylaxis SCDs    DVT prophylaxis: SCDs Code Status: DNR Family Communication:  Disposition: Status is: Inpatient Remains inpatient appropriate because: IV fluids required    Consultants:   Procedures:   Antimicrobials:    Subjective: PT says she is starting to feel better after transfusion.   Objective: Vitals:   08/26/21 0700 08/26/21 0730 08/26/21 0736 08/26/21 0849  BP: (!) 144/78 (!) 155/81  (!) 174/78  Pulse: 80 85  73  Resp: '12 15  18  '$ Temp:   98.9 F (37.2 C) 99 F (37.2 C)  TempSrc:   Oral Oral  SpO2: 93% 93%  100%  Weight:    59.7 kg  Height:        Intake/Output Summary (Last 24 hours) at 08/26/2021 1154 Last data filed at 08/26/2021 0703 Gross per 24 hour  Intake 1622.33 ml  Output 1650 ml  Net -27.67 ml   Filed  Weights   08/25/21 1159 08/26/21 0849  Weight: 56.2 kg 59.7 kg   Examination:  General exam: Appears calm and comfortable  Respiratory system: Clear to auscultation. Respiratory effort normal. Cardiovascular system: normal S1 & S2 heard. No JVD, murmurs, rubs, gallops or clicks. No pedal  edema. Gastrointestinal system: Abdomen is nondistended, soft and nontender. No organomegaly or masses felt. Normal bowel sounds heard. Central nervous system: Alert and oriented. No focal neurological deficits. Extremities: Symmetric 5 x 5 power. Skin: No rashes, lesions or ulcers. Psychiatry: Judgement and insight appear normal. Mood & affect appropriate.   Data Reviewed: I have personally reviewed following labs and imaging studies  CBC: Recent Labs  Lab 08/25/21 1204 08/26/21 0412  WBC 105.4* 100.3*  HGB 6.3* 9.0*  HCT 19.8* 26.8*  MCV 90.8 88.7  PLT 70* 72*    Basic Metabolic Panel: Recent Labs  Lab 08/25/21 1233 08/26/21 0412  NA 137 139  K 4.4 4.2  CL 106 109  CO2 23 22  GLUCOSE 134* 72  BUN 60* 43*  CREATININE 4.48* 2.43*  CALCIUM 7.9* 8.3*    CBG: Recent Labs  Lab 08/25/21 1228  GLUCAP 126*    Recent Results (from the past 240 hour(s))  Blood Culture (routine x 2)     Status: None (Preliminary result)   Collection Time: 08/25/21 12:59 PM   Specimen: BLOOD  Result Value Ref Range Status   Specimen Description BLOOD  Final   Special Requests   Final    BOTTLES DRAWN AEROBIC ONLY BLOOD RIGHT HAND Blood Culture results may not be optimal due to an inadequate volume of blood received in culture bottles   Culture   Final    NO GROWTH < 24 HOURS Performed at [facility], [address] [place], [place] [address]    Report Status PENDING  Incomplete  Blood Culture (routine x 2)     Status: None (Preliminary result)   Collection Time: 08/25/21  1:01 PM   Specimen: Vein; Blood  Result Value Ref Range Status   Specimen Description PENDING  Incomplete   Special Requests   Final    BOTTLES DRAWN AEROBIC ONLY RIGHT ANTECUBITAL Blood Culture adequate volume   Culture   Final    NO GROWTH < 24 HOURS Performed at [facility], [address], [place], [place] [address]    Report Status PENDING  Incomplete     Radiology Studies: CT ABDOMEN PELVIS WO  CONTRAST  Result Date: 08/25/2021 CLINICAL DATA:  Splenomegaly.  Dizziness.  History of leukemia. EXAM: CT ABDOMEN AND PELVIS WITHOUT CONTRAST TECHNIQUE: Multidetector CT imaging of the abdomen and pelvis was performed following the standard protocol without IV contrast. RADIATION DOSE REDUCTION: This exam was performed according to the departmental dose-optimization program which includes automated exposure control, adjustment of the mA and/or kV according to patient size and/or use of iterative reconstruction technique. COMPARISON:  04/07/2021 FINDINGS: Lower chest: Lung bases are clear except for minimal linear atelectasis or scarring. Hepatobiliary: Liver appears normal without contrast. Previous cholecystectomy. Pancreas: Normal Spleen: Spleen measures 13.9 by 10.4 x 5.4 cm today compared with 14.3 by 10.5 by 5.6 cm previously. Splenic index is 781. Splenic index greater than 480 suggests splenomegaly. Estimated splenic volume is 483 cc. Adrenals/Urinary Tract: Adrenal glands are normal. Kidneys are normal. Bladder is normal. Some limitation in evaluation due to beam hardening from the right hip replacement. Stomach/Bowel: Stomach and small intestine are normal. Large amount of fecal matter in the colon but without  evidence of obstruction or inflammation. Vascular/Lymphatic: Aortic atherosclerosis. No aneurysm. IVC is normal. No adenopathy. Reproductive: Previous hysterectomy.  No pelvic mass. Other: No free fluid or air. Musculoskeletal: Ordinary lower lumbar degenerative changes. Hip replacement on the right. Some osteoarthritis of the left hip. IMPRESSION: No acute organ finding. Splenomegaly as seen on the prior exam. Splenic index is 781. Estimated splenic volume is 483 cc. Aortic Atherosclerosis (ICD10-I70.0). Electronically Signed   By: Nelson Chimes M.D.   On: 08/25/2021 14:42   DG Chest Port 1 View  Result Date: 08/25/2021 CLINICAL DATA:  Dizziness, sepsis.  April 16, 2020. EXAM: PORTABLE CHEST 1  VIEW COMPARISON:  None Available. FINDINGS: The heart size and mediastinal contours are within normal limits. Both lungs are clear. Right internal jugular Port-A-Cath is unchanged in position. The visualized skeletal structures are unremarkable. IMPRESSION: No active disease. Electronically Signed   By: Marijo Conception M.D.   On: 08/25/2021 12:58    Scheduled Meds:  allopurinol  300 mg Oral Daily   citalopram  20 mg Oral Daily   docusate sodium  100 mg Oral BID   mirtazapine  15 mg Oral QHS   pantoprazole  40 mg Oral QAC breakfast   Continuous Infusions:  sodium chloride     ceFEPime (MAXIPIME) IV     lactated ringers 100 mL/hr at 08/26/21 0904   [START ON 08/27/2021] vancomycin       LOS: 1 day   Time spent: 35 mins  Mariah Gerstenberger Wynetta Emery, MD How to contact the [facility] Attending or Consulting provider [place] or covering provider during after hours [place], for this patient?  Check the care team in [facility] and look for a) attending/consulting TRH provider listed and b) the [facility] team listed Log into [email or web address] and use [place]'s universal password to access. If you do not have the password, please contact the hospital operator. Locate the [facility] provider you are looking for under [facility] and page to a number that you can be directly reached. If you still have difficulty reaching the provider, please page the [facility] (Director on Call) for the Hospitalists listed on amion for assistance.  08/26/2021, 11:54 AM

## 2021-08-26 NOTE — TOC Progression Note (Signed)
Transition of Care [facility]) - Progression Note    Patient Details  Name: Tariana Moldovan MRN: [id number] Date of Birth: 01-31-1946  Transition of Care [facility]) CM/SW Contact  Salome Arnt, [place] Phone Number: 08/26/2021, 10:39 AM  Clinical Narrative:   Transition of Care [facility]) Screening Note   Patient Details  Name: Nida Manfredi Date of Birth: [birth year]   Transition of Care [facility]) CM/SW Contact:    Salome Arnt, [place] Phone Number: 08/26/2021, 10:39 AM    Transition of Care Department [facility]) has reviewed patient and no TOC needs have been identified at this time. We will continue to monitor patient advancement through interdisciplinary progression rounds. If new patient transition needs arise, please place a TOC consult.            Expected Discharge Plan and Services                                                 Social Determinants of Health (SDOH) Interventions    Readmission Risk Interventions     No data to display

## 2021-08-26 NOTE — ED Notes (Signed)
Blood infusion complete no obvious signs of allergic reaction.

## 2021-08-26 NOTE — ED Notes (Signed)
Attempted report to floor, nurse not yet assigned.

## 2021-08-26 NOTE — Evaluation (Signed)
Physical Therapy Evaluation Patient Details Name: Crystalina Stodghill MRN: [id number] DOB: [birth year] Today's Date: 08/26/2021  History of Present Illness  Ramata Strothman  is a 76 y.o. female, with PMH of MPL positive myeloproliferative neoplasm, HTN, lupus, MDD, GERD.  -Patient presents to multiple complaints to ED, reports generalized weakness, fatigue, lack of energy, dizziness, for last few days, she reports strong color in her urine, she denies any nausea, vomiting or diarrhea, she denies any coffee-ground emesis, no bright red blood per rectum, no fever, no chills, she is on oral JAKAI for MPL positive myeloproliferative neoplasm, patient reports she required frequent transfusions due to anemia from her MPL, denies cough, chest pain or sick contacts.   Clinical Impression  Patient presents supine in bed on 1 LPM. Patient consents to PT evaluation and placed on room air throughout session to simulate home environment. Patient is modified independent with bed mobility requiring extended time and demonstrating mild labored movement. Good trunk control observed with ability to reach sitting position and scoot to EOB. Patient is modified independent with transfers with RW. Appropriate strength and coordination demonstrated while also performing with good technical form. Patient was able to ambulate 100 feet in hallway with supervision and RW. Mild gait deviations including excessive R LE ER but able to maintain safe and appropriate gait pattern. Mild balance deficits observed. Mild difficulty with turning requiring wide turns to rotate and change directions. Patient tolerated sitting up in chair after therapy with nursing staff notified of mobility status. Patient discharged to care of nursing for ambulation daily as tolerated for length of stay.      Recommendations for follow up therapy are one component of a multi-disciplinary discharge planning process, led by the attending physician.  Recommendations  may be updated based on patient status, additional functional criteria and insurance authorization.  Follow Up Recommendations No PT follow up      Assistance Recommended at Discharge Set up Supervision/Assistance  Patient can return home with the following  A little help with walking and/or transfers;Help with stairs or ramp for entrance;Assistance with cooking/housework    Equipment Recommendations None recommended by PT  Recommendations for Other Services       Functional Status Assessment Patient has not had a recent decline in their functional status     Precautions / Restrictions Precautions Precautions: Fall Restrictions Weight Bearing Restrictions: No      Mobility  Bed Mobility Overal bed mobility: Modified Independent             General bed mobility comments: Modified independent with bed mobility requiring extended time with mild labored movement. Good trunk control demonstrated    Transfers Overall transfer level: Modified independent Equipment used: Rolling walker (2 wheels)               General transfer comment: Modified independent with transfers and use of RW. Appropriate strength and coordination demonstrated    Ambulation/Gait Ambulation/Gait assistance: Supervision Gait Distance (Feet): 100 Feet Assistive device: Rolling walker (2 wheels) Gait Pattern/deviations: Decreased step length - left, Decreased stride length, Decreased step length - right, Narrow base of support, Trunk flexed, Decreased weight shift to left, Decreased weight shift to right Gait velocity: decreased     General Gait Details: Able to ambulate 100 feet in hallway with supervision and use of RW. Mild gait deviaitons with excessive R LE ER but able to maintain appropriate gait pattern. Mild balance deficits. Requires wide turns to turn around.  Stairs  Wheelchair Mobility    Modified Rankin (Stroke Patients Only)       Balance Overall balance  assessment: Mild deficits observed, not formally tested                                           Pertinent Vitals/Pain Pain Assessment Pain Assessment: No/denies pain    Home Living Family/patient expects to be discharged to:: Private residence Living Arrangements: Spouse/significant other Available Help at Discharge: Family;Available PRN/intermittently;Available 24 hours/day Type of Home: House Home Access: Stairs to enter Entrance Stairs-Rails: Right Entrance Stairs-Number of Steps: 4   Home Layout: Two level;Able to live on main level with bedroom/bathroom Home Equipment: Rollator (4 wheels);Grab bars - tub/shower;BSC/3in1;Cane - single point      Prior Function Prior Level of Function : Independent/Modified Independent             Mobility Comments: Patient reports ambulating at home and in the community independently with rollator. Currently not driving ADLs Comments: Patient reports being independent with ADL's with help with cleaning prn by husband     Hand Dominance        Extremity/Trunk Assessment   Upper Extremity Assessment Upper Extremity Assessment: Overall WFL for tasks assessed    Lower Extremity Assessment Lower Extremity Assessment: Overall WFL for tasks assessed    Cervical / Trunk Assessment Cervical / Trunk Assessment: Normal  Communication   Communication: No difficulties  Cognition Arousal/Alertness: Awake/alert Behavior During Therapy: WFL for tasks assessed/performed Overall Cognitive Status: Within Functional Limits for tasks assessed                                          General Comments      Exercises     Assessment/Plan    PT Assessment Patient does not need any further PT services  PT Problem List         PT Treatment Interventions      PT Goals (Current goals can be found in the Care Plan section)  Acute Rehab PT Goals Patient Stated Goal: return home PT Goal Formulation:  With patient Time For Goal Achievement: 08/26/21 Potential to Achieve Goals: Good    Frequency       Co-evaluation               AM-PAC PT "6 Clicks" Mobility  Outcome Measure Help needed turning from your back to your side while in a flat bed without using bedrails?: None Help needed moving from lying on your back to sitting on the side of a flat bed without using bedrails?: None Help needed moving to and from a bed to a chair (including a wheelchair)?: None Help needed standing up from a chair using your arms (e.g., wheelchair or bedside chair)?: A Little Help needed to walk in hospital room?: A Little Help needed climbing 3-5 steps with a railing? : A Little 6 Click Score: 21    End of Session   Activity Tolerance: Patient tolerated treatment well;No increased pain Patient left: in chair;with call bell/phone within reach Nurse Communication: Mobility status PT Visit Diagnosis: Other abnormalities of gait and mobility (R26.89);Unsteadiness on feet (R26.81);Muscle weakness (generalized) (M62.81)    Time: 5621-3086 PT Time Calculation (min) (ACUTE ONLY): 22 min   Charges:   PT Evaluation $PT  Eval Moderate Complexity: 1 Mod PT Treatments $Therapeutic Activity: 8-22 mins        12:12 PM, 08/26/21 Lestine Box, S/PT

## 2021-08-26 NOTE — ED Notes (Signed)
Pt's temp went up from 99.3 to 100.8, admitting Dr notified and orders received for tylenol and benadryl. Continued giving blood product.

## 2021-08-26 NOTE — Hospital Course (Signed)
76 y.o. female, with PMH of MPL positive myeloproliferative neoplasm, HTN, lupus, MDD, GERD. -Patient presents to multiple complaints to ED, reports generalized weakness, fatigue, lack of energy, dizziness, for last few days, she reports strong color in her urine, she denies any nausea, vomiting or diarrhea, she denies any coffee-ground emesis, no bright red blood per rectum, no fever, no chills, she is on oral JAKAI for MPL positive myeloproliferative neoplasm, patient reports she required frequent transfusions due to anemia from her MPL, denies cough, chest pain or sick contacts. - in ED significant for white blood cell 105K, baseline around 98, hemoglobin was low at 6.3, her baseline between 7-8, creatinine was elevated at 4.48, baseline around 1.5, CT  abdomen pelvis significant for hepatosplenomegaly, she was ordered 2 units PRBC transfusion, and [facility] hospitalist consulted to admit

## 2021-08-26 NOTE — ED Notes (Signed)
Dr. Clearence Ped notified of pt's oral temp of 101, no orders received

## 2021-08-27 DIAGNOSIS — D649 Anemia, unspecified: Secondary | ICD-10-CM | POA: Diagnosis not present

## 2021-08-27 DIAGNOSIS — K2100 Gastro-esophageal reflux disease with esophagitis, without bleeding: Secondary | ICD-10-CM | POA: Diagnosis not present

## 2021-08-27 DIAGNOSIS — I10 Essential (primary) hypertension: Secondary | ICD-10-CM | POA: Diagnosis not present

## 2021-08-27 DIAGNOSIS — N179 Acute kidney failure, unspecified: Secondary | ICD-10-CM | POA: Diagnosis not present

## 2021-08-27 LAB — BASIC METABOLIC PANEL
Anion gap: 7 (ref 5–15)
BUN: 21 mg/dL (ref 8–23)
CO2: 24 mmol/L (ref 22–32)
Calcium: 8.5 mg/dL — ABNORMAL LOW (ref 8.9–10.3)
Chloride: 109 mmol/L (ref 98–111)
Creatinine, Ser: 1.3 mg/dL — ABNORMAL HIGH (ref 0.44–1.00)
GFR, Estimated: 43 mL/min — ABNORMAL LOW (ref >60)
Glucose, Bld: 98 mg/dL (ref 70–99)
Potassium: 3.5 mmol/L (ref 3.5–5.1)
Sodium: 140 mmol/L (ref 135–145)

## 2021-08-27 LAB — MAGNESIUM: Magnesium: 1.4 mg/dL — ABNORMAL LOW (ref 1.7–2.4)

## 2021-08-27 LAB — URINE CULTURE: Culture: 20000 — AB

## 2021-08-27 MED ORDER — HYDRALAZINE HCL 20 MG/ML IJ SOLN: 5.0000 mg | INTRAMUSCULAR | Status: DC | PRN

## 2021-08-27 MED ORDER — MAGNESIUM SULFATE 4 GM/100ML IV SOLN
4.0000 g | Freq: Once | INTRAVENOUS | Status: AC
  Administered 2021-08-27: 4 g via INTRAVENOUS
  Filled 2021-08-27: qty 100

## 2021-08-27 MED ORDER — LOSARTAN POTASSIUM 50 MG PO TABS
25.0000 mg | ORAL_TABLET | Freq: Every day | ORAL | Status: DC
  Administered 2021-08-27 – 2021-08-28 (×2): 25 mg via ORAL
  Filled 2021-08-27 (×2): qty 1

## 2021-08-27 MED ORDER — SODIUM CHLORIDE 0.9 % IV SOLN
2.0000 g | Freq: Two times a day (BID) | INTRAVENOUS | Status: DC
  Administered 2021-08-27 (×2): 2 g via INTRAVENOUS
  Filled 2021-08-27 (×2): qty 12.5

## 2021-08-27 NOTE — Evaluation (Signed)
Occupational Therapy Evaluation Patient Details Name: Renee Waters MRN: [id number] DOB: 1945-08-28 Today's Date: 08/27/2021   History of Present Illness Renee Waters  is a 76 y.o. female, with PMH of MPL positive myeloproliferative neoplasm, HTN, lupus, MDD, GERD.  -Patient presents to multiple complaints to ED, reports generalized weakness, fatigue, lack of energy, dizziness, for last few days, she reports strong color in her urine, she denies any nausea, vomiting or diarrhea, she denies any coffee-ground emesis, no bright red blood per rectum, no fever, no chills, she is on oral JAKAI for MPL positive myeloproliferative neoplasm, patient reports she required frequent transfusions due to anemia from her MPL, denies cough, chest pain or sick contacts.   Clinical Impression   Pt agreeable to OT evaluation. Pt received supine in bed. She reports that she lives at home with her husband who is able to provide assist as needed. Pt completed bed mobility with increased time. Required use of RW to complete sit to stand t/f and toilet t/f. She demonstrated ability to complete toilet hygiene independently and stood at sink level to wash hands independently with use of RW. Pt reports that she feels she is near her baseline for ADLs and functional mobility. No further OT services are recommended.      Recommendations for follow up therapy are one component of a multi-disciplinary discharge planning process, led by the attending physician.  Recommendations may be updated based on patient status, additional functional criteria and insurance authorization.   Follow Up Recommendations  No OT follow up    Assistance Recommended at Discharge PRN  Patient can return home with the following A little help with bathing/dressing/bathroom    Functional Status Assessment  Patient has had a recent decline in their functional status and demonstrates the ability to make significant improvements in function in a  reasonable and predictable amount of time.  Equipment Recommendations       Recommendations for Other Services       Precautions / Restrictions Precautions Precautions: Fall Restrictions Weight Bearing Restrictions: No      Mobility Bed Mobility Overal bed mobility: Modified Independent             General bed mobility comments: increased time to complete bed mobility    Transfers Overall transfer level: Modified independent Equipment used: Rolling walker (2 wheels)               General transfer comment: Modified independent with use of RW during transfers. Completed sit to stand t/f and toilet t/f with use of RW.      Balance Overall balance assessment: Modified Independent (reliant on RW)                                         ADL either performed or assessed with clinical judgement   ADL Overall ADL's : Independent;Modified independent                                       General ADL Comments: Pt reports that she has hip pain and required mod assist to don socks. She independently completed toilet hygiene, washed hands, washed face independently.     Vision Baseline Vision/History: 0 No visual deficits Ability to See in Adequate Light: 0 Adequate Patient Visual Report: No change from baseline  Pertinent Vitals/Pain Pain Assessment Pain Assessment: No/denies pain     Hand Dominance Right   Extremity/Trunk Assessment Upper Extremity Assessment Upper Extremity Assessment: Overall WFL for tasks assessed   Lower Extremity Assessment Lower Extremity Assessment: Defer to PT evaluation   Cervical / Trunk Assessment Cervical / Trunk Assessment: Normal   Communication Communication Communication: No difficulties   Cognition Arousal/Alertness: Awake/alert Behavior During Therapy: WFL for tasks assessed/performed Overall Cognitive Status: Within Functional Limits for tasks assessed                                                         Home Living Family/patient expects to be discharged to:: Private residence Living Arrangements: Spouse/significant other Available Help at Discharge: Family;Available 24 hours/day Type of Home: House Home Access: Stairs to enter [employer] of Steps: 5 Entrance Stairs-Rails: Left;Right Home Layout: Two level;Able to live on main level with bedroom/bathroom     Bathroom Shower/Tub: Occupational psychologist: Standard Bathroom Accessibility: Yes   Home Equipment: Rollator (4 wheels);Grab bars - tub/shower;BSC/3in1;Cane - single point          Prior Functioning/Environment Prior Level of Function : Independent/Modified Independent             Mobility Comments: pt reports abilty to ambulate at home and in communinty with RW. Reports that she drives sometimes ADLs Comments: Independent with ADLs                       AM-PAC OT "6 Clicks" Daily Activity     Outcome Measure Help from another person eating meals?: None Help from another person taking care of personal grooming?: None Help from another person toileting, which includes using toliet, bedpan, or urinal?: None Help from another person bathing (including washing, rinsing, drying)?: None Help from another person to put on and taking off regular upper body clothing?: None Help from another person to put on and taking off regular lower body clothing?: A Little 6 Click Score: 23   End of Session Equipment Utilized During Treatment: Rolling walker (2 wheels) Nurse Communication: Mobility status  Activity Tolerance: Patient tolerated treatment well Patient left: in bed;with call bell/phone within reach  OT Visit Diagnosis: Muscle weakness (generalized) (M62.81)                Time: 7425-9563 OT Time Calculation (min): 17 min Charges:  OT General Charges $OT Visit: 1 Visit OT Evaluation $OT Eval Low Complexity: 1  Low OT Treatments $Self Care/Home Management : 8-22 mins   Frederic Jericho, OTR/L  08/27/2021, 9:51 AM

## 2021-08-27 NOTE — Progress Notes (Signed)
Pharmacy Antibiotic Note  Renee Waters is a 76 y.o. female admitted on 08/25/2021 with  unknown source of infection .  Pharmacy has been consulted for cefepime dosing. Renal function improved  Plan: Increase Cefepime 2000 mg IV  every 12 hours. Monitor labs, c/s and clinical progress  Height: '5\' 5"'$  (165.1 cm) Weight: 59.7 kg (131 lb 11.2 oz) IBW/kg (Calculated) : 57  Temp (24hrs), Avg:98.7 F (37.1 C), Min:98.2 F (36.8 C), Max:99.5 F (37.5 C)  Recent Labs  Lab 08/25/21 1204 08/25/21 1233 08/26/21 0412 08/27/21 0502  WBC 105.4*  --  100.3*  --   CREATININE  --  4.48* 2.43* 1.30*  LATICACIDVEN  --  1.4  --   --      Estimated Creatinine Clearance: 33.6 mL/min (A) (by C-G formula based on SCr of 1.3 mg/dL (H)).    Allergies  Allergen Reactions   Bee Venom Swelling and Hives   Acetaminophen     itches   Amlodipine     Hair loss   Tyloxapol    Other Itching, Rash and Swelling   Oxycodone-Acetaminophen Rash    Pt states, "this gives her a rash, but at home she takes oxycodone for pain relief" Pt states, "this gives her a rash, but at home she takes oxycodone for pain relief"    Antimicrobials this admission: Vanco 7/19 >> Cefepime 7/19 >> Flagyl 7/19  Microbiology results: 7/19 JKQ:ASUO 7/19 UCx: 20K CFU/ml GNR   Thank you for allowing pharmacy to be a part of this patient's care.  Isac Sarna, BS Pharm D, BCPS Clinical Pharmacist 08/27/2021 7:59 AM

## 2021-08-27 NOTE — Assessment & Plan Note (Signed)
--   IV replacement given, recheck in AM 

## 2021-08-27 NOTE — Progress Notes (Addendum)
PROGRESS NOTE   Renee Waters  WJX:[id number] DOB: January 05, 1946 DOA: 08/25/2021 PCP: [name], MD   Chief Complaint  Patient presents with   Hypotension   Level of care: Med-Surg  Brief Admission History:  76 y.o. female, with PMH of MPL positive myeloproliferative neoplasm, HTN, lupus, MDD, GERD. -Patient presents to multiple complaints to ED, reports generalized weakness, fatigue, lack of energy, dizziness, for last few days, she reports strong color in her urine, she denies any nausea, vomiting or diarrhea, she denies any coffee-ground emesis, no bright red blood per rectum, no fever, no chills, she is on oral JAKAI for MPL positive myeloproliferative neoplasm, patient reports she required frequent transfusions due to anemia from her MPL, denies cough, chest pain or sick contacts. - in ED significant for white blood cell 105K, baseline around 98, hemoglobin was low at 6.3, her baseline between 7-8, creatinine was elevated at 4.48, baseline around 1.5, CT  abdomen pelvis significant for hepatosplenomegaly, she was ordered 2 units PRBC transfusion, and [facility] hospitalist consulted to admit   Assessment and Plan:   AKI in CKD stage IIIb -Baseline creatinine is 1.3, currently 4.48, this appears to be prerenal/hypovolemia with BUN of 60, currently dehydrated, hypotensive and with profound anemia. -We will check bladder scan to ensure there is no urinary retention(she has no suprapubic fullness or tenderness). -Continue with IV fluids -Follow up UA. -Avoid nephrotoxic medications -as renal function improved to baseline (1.35) resumed home losartan 25 mg   Hypotension - resolved -Likely due to volume depletion and dehydration, but infection is not ruled out yet, but lactic acid is reassuring -Continue with a broad-spectrum antibiotic for next 24 hours pending septic work-up. -Continue with IV fluids -temporarily held home antihypertensive medications but can start to resume as BP  rebounds  Essential hypertension - BP is rebounding now up to 170 SBP - metoprolol 25 mg BID, resumed home losartan 25 mg daily   MPL positive myeloproliferative neoplasm Leukocytosis Thrombocytopenia Anemia -She is followed by [name] -He is with anemia with hemoglobin of 6.3, requiring frequent transfusions, discussed with [name], recommendation for 2 units PRBC transfusion, will check Hemoccult to rule out GI bleed, she denies any blood with the stools. -Hg up to 9.0 after PRBC transfusion -Thrombocytopenia at baseline, will keep on SCD for DVT prophylaxis. -Continue with allopurinol - she  is on Ruxolitinib, will hold till she is more stable.   Failure to thrive/protein calorie malnutrition/deconditioning Body mass index is 20.63 kg/m. -Continue with Remeron, will start on Ensure -Consulted PT/OT - no PT follow up needed    Depression -Continue with home medications    DVT Prophylaxis SCDs    DVT prophylaxis: SCDs Code Status: DNR Family Communication:  Disposition: Status is: Inpatient Remains inpatient appropriate because: IV fluids required    Consultants:   Procedures:   Antimicrobials:    Subjective: Reports weakness and malaise but improving from admission.   Objective: Vitals:   08/26/21 1700 08/26/21 2255 08/27/21 0351 08/27/21 1454  BP: (!) 171/82 (!) 172/88 (!) 161/78 (!) 161/87  Pulse: 72 80 73 73  Resp: '19 18 18   '$ Temp: 98.6 F (37 C) 98.4 F (36.9 C) 99.5 F (37.5 C) 98.7 F (37.1 C)  TempSrc: Oral   Oral  SpO2: 100% 97% 99% 98%  Weight:      Height:        Intake/Output Summary (Last 24 hours) at 08/27/2021 1603 Last data filed at 08/27/2021 1500 Gross per 24 hour  Intake 1300 ml  Output --  Net 1300 ml   Filed Weights   08/25/21 1159 08/26/21 0849  Weight: 56.2 kg 59.7 kg   Examination:  General exam: Appears calm and comfortable  Respiratory system: Clear to auscultation. Respiratory effort  normal. Cardiovascular system: normal S1 & S2 heard. No JVD, murmurs, rubs, gallops or clicks. No pedal edema. Gastrointestinal system: Abdomen is nondistended, soft and nontender. No organomegaly or masses felt. Normal bowel sounds heard. Central nervous system: Alert and oriented. No focal neurological deficits. Extremities: Symmetric 5 x 5 power. Skin: No rashes, lesions or ulcers. Psychiatry: Judgement and insight appear normal. Mood & affect appropriate.   Data Reviewed: I have personally reviewed following labs and imaging studies  CBC: Recent Labs  Lab 08/25/21 1204 08/26/21 0412  WBC 105.4* 100.3*  HGB 6.3* 9.0*  HCT 19.8* 26.8*  MCV 90.8 88.7  PLT 70* 72*    Basic Metabolic Panel: Recent Labs  Lab 08/25/21 1233 08/26/21 0412 08/27/21 0502  NA 137 139 140  K 4.4 4.2 3.5  CL 106 109 109  CO2 '23 22 24  '$ GLUCOSE 134* 72 98  BUN 60* 43* 21  CREATININE 4.48* 2.43* 1.30*  CALCIUM 7.9* 8.3* 8.5*  MG  --   --  1.4*    CBG: Recent Labs  Lab 08/25/21 1228  GLUCAP 126*    Recent Results (from the past 240 hour(s))  Blood Culture (routine x 2)     Status: None (Preliminary result)   Collection Time: 08/25/21 12:59 PM   Specimen: BLOOD  Result Value Ref Range Status   Specimen Description BLOOD  Final   Special Requests   Final    BOTTLES DRAWN AEROBIC ONLY BLOOD RIGHT HAND Blood Culture results may not be optimal due to an inadequate volume of blood received in culture bottles   Culture   Final    NO GROWTH 2 DAYS Performed at [facility], [address], [place], [place] [address]    Report Status PENDING  Incomplete  Blood Culture (routine x 2)     Status: None (Preliminary result)   Collection Time: 08/25/21  1:01 PM   Specimen: Vein; Blood  Result Value Ref Range Status   Specimen Description PENDING  Incomplete   Special Requests   Final    BOTTLES DRAWN AEROBIC ONLY RIGHT ANTECUBITAL Blood Culture adequate volume   Culture   Final    NO GROWTH 2  DAYS Performed at [facility], [address], [place], [place] [address]    Report Status PENDING  Incomplete  Urine Culture     Status: Abnormal   Collection Time: 08/25/21  6:24 PM   Specimen: Urine, Clean Catch  Result Value Ref Range Status   Specimen Description   Final    URINE, CLEAN CATCH Performed at [facility], [address] [place], [place] [address]    Special Requests   Final    NONE Performed at [facility], [address] [place], [place] [address]    Culture 20,000 COLONIES/mL ESCHERICHIA COLI (A)  Final   Report Status 08/27/2021 FINAL  Final   Organism ID, Bacteria ESCHERICHIA COLI (A)  Final      Susceptibility   Escherichia coli - MIC*    AMPICILLIN <=2 SENSITIVE Sensitive     CEFAZOLIN <=4 SENSITIVE Sensitive     CEFEPIME <=0.12 SENSITIVE Sensitive     CEFTRIAXONE <=0.25 SENSITIVE Sensitive     CIPROFLOXACIN <=0.25 SENSITIVE Sensitive  GENTAMICIN <=1 SENSITIVE Sensitive     IMIPENEM <=0.25 SENSITIVE Sensitive     NITROFURANTOIN <=16 SENSITIVE Sensitive     TRIMETH/SULFA <=20 SENSITIVE Sensitive     AMPICILLIN/SULBACTAM <=2 SENSITIVE Sensitive     PIP/TAZO <=4 SENSITIVE Sensitive     * 20,000 COLONIES/mL ESCHERICHIA COLI     Radiology Studies: No results found.  Scheduled Meds:  allopurinol  300 mg Oral Daily   Chlorhexidine Gluconate Cloth  6 each Topical Daily   citalopram  20 mg Oral Daily   docusate sodium  100 mg Oral BID   losartan  25 mg Oral Daily   metoprolol tartrate  25 mg Oral BID   mirtazapine  15 mg Oral QHS   pantoprazole  40 mg Oral QAC breakfast   Continuous Infusions:  sodium chloride     ceFEPime (MAXIPIME) IV 2 g (08/27/21 1051)   lactated ringers 50 mL/hr at 08/27/21 1557     LOS: 2 days   Time spent: 35 mins  Atiba Kimberlin Wynetta Emery, MD How to contact the [facility] Attending or Consulting provider [place] or covering provider during after hours [place], for this patient?  Check the care team in [facility] and look for a)  attending/consulting [facility] provider listed and b) the [facility] team listed Log into [email or web address] and use [facility] universal password to access. If you do not have the password, please contact the hospital operator. Locate the [facility] provider you are looking for under [facility] and page to a number that you can be directly reached. If you still have difficulty reaching the provider, please page the [facility] (Director on Call) for the Hospitalists listed on amion for assistance.  08/27/2021, 4:03 PM

## 2021-08-28 DIAGNOSIS — K2100 Gastro-esophageal reflux disease with esophagitis, without bleeding: Secondary | ICD-10-CM | POA: Diagnosis not present

## 2021-08-28 DIAGNOSIS — I10 Essential (primary) hypertension: Secondary | ICD-10-CM | POA: Diagnosis not present

## 2021-08-28 DIAGNOSIS — N179 Acute kidney failure, unspecified: Secondary | ICD-10-CM | POA: Diagnosis not present

## 2021-08-28 LAB — MAGNESIUM: Magnesium: 1.9 mg/dL (ref 1.7–2.4)

## 2021-08-28 MED ORDER — METOPROLOL TARTRATE 25 MG PO TABS: 25.0000 mg | ORAL_TABLET | Freq: Two times a day (BID) | ORAL | 1 refills | Status: DC

## 2021-08-28 MED ORDER — HEPARIN SOD (PORK) LOCK FLUSH 100 UNIT/ML IV SOLN
500.0000 [IU] | Freq: Once | INTRAVENOUS | Status: AC
  Administered 2021-08-28: 500 [IU] via INTRAVENOUS
  Filled 2021-08-28: qty 5

## 2021-08-28 NOTE — Discharge Summary (Signed)
Physician Discharge Summary  Renee Waters MIW:[id number] DOB: Apr 07, 1945 DOA: 08/25/2021  PCP: Renee Helper, MD  Admit date: 08/25/2021 Discharge date: 08/28/2021  Admitted From:  Home  Disposition: Home   Recommendations for Outpatient Follow-up:  Follow up with PCP in 1 weeks for BP check and hospital follow up  Please obtain BMP in 1-2 weeks  Discharge Condition: STABLE   CODE STATUS: DNR DIET: 2 gm sodium    Brief Hospitalization Summary: Please see all hospital notes, images, labs for full details of the hospitalization. 76 y.o. female, with PMH of MPL positive myeloproliferative neoplasm, HTN, lupus, MDD, GERD. -Patient presents to multiple complaints to ED, reports generalized weakness, fatigue, lack of energy, dizziness, for last few days, she reports strong color in her urine, she denies any nausea, vomiting or diarrhea, she denies any coffee-ground emesis, no bright red blood per rectum, no fever, no chills, she is on oral JAKAI for MPL positive myeloproliferative neoplasm, patient reports she required frequent transfusions due to anemia from her MPL, denies cough, chest pain or sick contacts. - in ED significant for white blood cell 105K, baseline around 98, hemoglobin was low at 6.3, her baseline between 7-8, creatinine was elevated at 4.48, baseline around 1.5, CT  abdomen pelvis significant for hepatosplenomegaly, she was ordered 2 units PRBC transfusion, and [facility] consulted to admit  Renee Waters    AKI in CKD stage IIIb -Baseline creatinine is 1.3, currently 4.48, this appears to be prerenal/hypovolemia with BUN of 60, currently dehydrated, hypotensive and with profound anemia. -We will check bladder scan to ensure there is no urinary retention(she has no suprapubic fullness or tenderness). -Continue with IV fluids -Follow up UA. -Avoid nephrotoxic medications -as renal function improved to baseline (1.35) resumed home losartan 25 mg    Hypotension - resolved -Likely due to volume depletion and dehydration, but infection is not ruled out yet, but lactic acid is reassuring -Continue with a broad-spectrum antibiotic for next 24 hours pending septic work-up. -Continue with IV fluids -temporarily held home antihypertensive medications but resumed as BP rebounded  UTI E Coli  - pansensitive  - fully treated in hospital with IV cefepime   Essential hypertension - BP is rebounding now up to 170 SBP - metoprolol 25 mg BID, resumed home losartan 25 mg daily - resume home HCTZ 25 mg daily at discharge - follow up with PCP in 1-2 weeks for blood pressure check    MPL positive myeloproliferative neoplasm Leukocytosis Thrombocytopenia Anemia -She is followed by [name] -He is with anemia with hemoglobin of 6.3, requiring frequent transfusions, discussed with [name], recommendation for 2 units PRBC transfusion, will check Hemoccult to rule out GI bleed, she denies any blood with the stools. -Hg up to 9.0 after PRBC transfusion -Thrombocytopenia at baseline, will keep on SCD for DVT prophylaxis. -Continue with allopurinol - she  is on Ruxolitinib which was held while acute infection was being treated.  - can resume at discharge as UTI fully treated now    Failure to thrive/protein calorie malnutrition/deconditioning Body mass index is 20.63 kg/m. -Continue with Remeron, will start on Ensure -Consulted PT/OT - no PT follow up needed    Depression -Continue with home medications    DVT Prophylaxis SCDs   Discharge Diagnoses:  Principal Problem:   AKI (acute kidney injury) ([place]) Active Problems:   Depression   Essential hypertension   GERD (gastroesophageal reflux disease)   Systemic lupus (HCC)   Symptomatic anemia  Hypomagnesemia   Discharge Instructions:  Allergies as of 08/28/2021       Reactions   Bee Venom Swelling, Hives   Acetaminophen    itches   Amlodipine    Hair loss    Tyloxapol    Other Itching, Rash, Swelling   Oxycodone-acetaminophen Rash   Pt states, "this gives her a rash, but at home she takes oxycodone for pain relief" Pt states, "this gives her a rash, but at home she takes oxycodone for pain relief"        Medication List     STOP taking these medications    ALPRAZolam 0.5 MG tablet Commonly known as: Xanax       TAKE these medications    acyclovir 800 MG tablet Commonly known as: ZOVIRAX Take 1 tablet (800 mg total) by mouth 5 (five) times daily. What changed: when to take this   acyclovir ointment 5 % Commonly known as: ZOVIRAX Apply 1 application. topically every 3 (three) hours.   allopurinol 300 MG tablet Commonly known as: ZYLOPRIM Take 1 tablet by mouth once daily   citalopram 20 MG tablet Commonly known as: CELEXA Take 1 tablet (20 mg total) by mouth daily.   EPINEPHrine 0.3 mg/0.3 mL Soaj injection Commonly known as: EPI-PEN INJECT 0.3 MLS INTO MUSCLE ONCE AS NEEDED What changed: See the new instructions.   gabapentin 300 MG capsule Commonly known as: NEURONTIN Take one capsule by mouth once daily, as needed, for pain   hydrochlorothiazide 25 MG tablet Commonly known as: HYDRODIURIL Take 1 tablet by mouth once daily   lidocaine-prilocaine cream Commonly known as: EMLA Apply 1 application. topically as needed.   losartan 25 MG tablet Commonly known as: COZAAR Take 1 tablet by mouth once daily for blood pressure   meloxicam 7.5 MG tablet Commonly known as: MOBIC Take 1 tablet (7.5 mg total) by mouth daily as needed for pain (knee pain).   metoprolol tartrate 25 MG tablet Commonly known as: LOPRESSOR Take 1 tablet (25 mg total) by mouth 2 (two) times daily.   mirtazapine 15 MG tablet Commonly known as: REMERON Take 1 tablet (15 mg total) by mouth at bedtime.   oxyCODONE 5 MG immediate release tablet Commonly known as: Oxy IR/ROXICODONE Take 5 mg by mouth every 4 (four) hours as needed.    pantoprazole 40 MG tablet Commonly known as: PROTONIX TAKE 1 TABLET BY MOUTH ONCE DAILY BEFORE BREAKFAST   potassium chloride 10 MEQ tablet Commonly known as: KLOR-CON Take 1 tablet by mouth once daily   ruxolitinib phosphate 15 MG tablet Commonly known as: JAKAFI Take 1 tablet (15 mg total) by mouth 2 (two) times daily.   temazepam 30 MG capsule Commonly known as: Restoril Take 1 capsule (30 mg total) by mouth at bedtime as needed for sleep.   tiZANidine 4 MG tablet Commonly known as: ZANAFLEX Take 8 mg by mouth 3 (three) times daily as needed (spasms).        Follow-up Information     Renee Helper, MD. Schedule an appointment as soon as possible for a visit in 1 week(s).   Specialty: Family Medicine Why: Hospital Follow Up Contact information: [address] [place] [address] [phone]                Allergies  Allergen Reactions   Bee Venom Swelling and Hives   Acetaminophen     itches   Amlodipine     Hair loss  Tyloxapol    Other Itching, Rash and Swelling   Oxycodone-Acetaminophen Rash    Pt states, "this gives her a rash, but at home she takes oxycodone for pain relief" Pt states, "this gives her a rash, but at home she takes oxycodone for pain relief"   Allergies as of 08/28/2021       Reactions   Bee Venom Swelling, Hives   Acetaminophen    itches   Amlodipine    Hair loss   Tyloxapol    Other Itching, Rash, Swelling   Oxycodone-acetaminophen Rash   Pt states, "this gives her a rash, but at home she takes oxycodone for pain relief" Pt states, "this gives her a rash, but at home she takes oxycodone for pain relief"        Medication List     STOP taking these medications    ALPRAZolam 0.5 MG tablet Commonly known as: Xanax       TAKE these medications    acyclovir 800 MG tablet Commonly known as: ZOVIRAX Take 1 tablet (800 mg total) by mouth 5 (five) times daily. What changed: when to take this    acyclovir ointment 5 % Commonly known as: ZOVIRAX Apply 1 application. topically every 3 (three) hours.   allopurinol 300 MG tablet Commonly known as: ZYLOPRIM Take 1 tablet by mouth once daily   citalopram 20 MG tablet Commonly known as: CELEXA Take 1 tablet (20 mg total) by mouth daily.   EPINEPHrine 0.3 mg/0.3 mL Soaj injection Commonly known as: EPI-PEN INJECT 0.3 MLS INTO MUSCLE ONCE AS NEEDED What changed: See the new instructions.   gabapentin 300 MG capsule Commonly known as: NEURONTIN Take one capsule by mouth once daily, as needed, for pain   hydrochlorothiazide 25 MG tablet Commonly known as: HYDRODIURIL Take 1 tablet by mouth once daily   lidocaine-prilocaine cream Commonly known as: EMLA Apply 1 application. topically as needed.   losartan 25 MG tablet Commonly known as: COZAAR Take 1 tablet by mouth once daily for blood pressure   meloxicam 7.5 MG tablet Commonly known as: MOBIC Take 1 tablet (7.5 mg total) by mouth daily as needed for pain (knee pain).   metoprolol tartrate 25 MG tablet Commonly known as: LOPRESSOR Take 1 tablet (25 mg total) by mouth 2 (two) times daily.   mirtazapine 15 MG tablet Commonly known as: REMERON Take 1 tablet (15 mg total) by mouth at bedtime.   oxyCODONE 5 MG immediate release tablet Commonly known as: Oxy IR/ROXICODONE Take 5 mg by mouth every 4 (four) hours as needed.   pantoprazole 40 MG tablet Commonly known as: PROTONIX TAKE 1 TABLET BY MOUTH ONCE DAILY BEFORE BREAKFAST   potassium chloride 10 MEQ tablet Commonly known as: KLOR-CON Take 1 tablet by mouth once daily   ruxolitinib phosphate 15 MG tablet Commonly known as: JAKAFI Take 1 tablet (15 mg total) by mouth 2 (two) times daily.   temazepam 30 MG capsule Commonly known as: Restoril Take 1 capsule (30 mg total) by mouth at bedtime as needed for sleep.   tiZANidine 4 MG tablet Commonly known as: ZANAFLEX Take 8 mg by mouth 3 (three) times daily  as needed (spasms).        Procedures/Studies: CT ABDOMEN PELVIS WO CONTRAST  Result Date: 08/25/2021 CLINICAL DATA:  Splenomegaly.  Dizziness.  History of leukemia. EXAM: CT ABDOMEN AND PELVIS WITHOUT CONTRAST TECHNIQUE: Multidetector CT imaging of the abdomen and pelvis was performed following the standard protocol without IV contrast.  RADIATION DOSE REDUCTION: This exam was performed according to the departmental dose-optimization program which includes automated exposure control, adjustment of the mA and/or kV according to patient size and/or use of iterative reconstruction technique. COMPARISON:  04/07/2021 FINDINGS: Lower chest: Lung bases are clear except for minimal linear atelectasis or scarring. Hepatobiliary: Liver appears normal without contrast. Previous cholecystectomy. Pancreas: Normal Spleen: Spleen measures 13.9 by 10.4 x 5.4 cm today compared with 14.3 by 10.5 by 5.6 cm previously. Splenic index is 781. Splenic index greater than 480 suggests splenomegaly. Estimated splenic volume is 483 cc. Adrenals/Urinary Tract: Adrenal glands are normal. Kidneys are normal. Bladder is normal. Some limitation in evaluation due to beam hardening from the right hip replacement. Stomach/Bowel: Stomach and small intestine are normal. Large amount of fecal matter in the colon but without evidence of obstruction or inflammation. Vascular/Lymphatic: Aortic atherosclerosis. No aneurysm. IVC is normal. No adenopathy. Reproductive: Previous hysterectomy.  No pelvic mass. Other: No free fluid or air. Musculoskeletal: Ordinary lower lumbar degenerative changes. Hip replacement on the right. Some osteoarthritis of the left hip. IMPRESSION: No acute organ finding. Splenomegaly as seen on the prior exam. Splenic index is 781. Estimated splenic volume is 483 cc. Aortic Atherosclerosis (ICD10-I70.0). Electronically Signed   By: Nelson Chimes M.D.   On: 08/25/2021 14:42   DG Chest Port 1 View  Result Date:  08/25/2021 CLINICAL DATA:  Dizziness, sepsis.  April 16, 2020. EXAM: PORTABLE CHEST 1 VIEW COMPARISON:  None Available. FINDINGS: The heart size and mediastinal contours are within normal limits. Both lungs are clear. Right internal jugular Port-A-Cath is unchanged in position. The visualized skeletal structures are unremarkable. IMPRESSION: No active disease. Electronically Signed   By: Marijo Conception M.D.   On: 08/25/2021 12:58     Subjective: Pt feeling a lot better now and tolerating diet well.   Discharge Exam: Vitals:   08/28/21 0543 08/28/21 0912  BP: (!) 156/80 (!) 171/83  Pulse: 73 77  Resp: 18   Temp: 98.3 F (36.8 C)   SpO2: 95%    Vitals:   08/27/21 1454 08/27/21 2040 08/28/21 0543 08/28/21 0912  BP: (!) 161/87 (!) 167/76 (!) 156/80 (!) 171/83  Pulse: 73 73 73 77  Resp:  18 18   Temp: 98.7 F (37.1 C) 99.5 F (37.5 C) 98.3 F (36.8 C)   TempSrc: Oral Oral    SpO2: 98% 96% 95%   Weight:      Height:        General: Pt is alert, awake, not in acute distress Cardiovascular: RRR, S1/S2 +, no rubs, no gallops Respiratory: CTA bilaterally, no wheezing, no rhonchi Abdominal: Soft, NT, ND, bowel sounds + Extremities: no edema, no cyanosis   The results of significant diagnostics from this hospitalization (including imaging, microbiology, ancillary and laboratory) are listed below for reference.     Microbiology: Recent Results (from the past 240 hour(s))  Blood Culture (routine x 2)     Status: None (Preliminary result)   Collection Time: 08/25/21 12:59 PM   Specimen: BLOOD  Result Value Ref Range Status   Specimen Description BLOOD  Final   Special Requests   Final    BOTTLES DRAWN AEROBIC ONLY BLOOD RIGHT HAND Blood Culture results may not be optimal due to an inadequate volume of blood received in culture bottles   Culture   Final    NO GROWTH 3 DAYS Performed at [facility], [address], [place], [place] [address]    Report Status  PENDING  Incomplete   Blood Culture (routine x 2)     Status: None (Preliminary result)   Collection Time: 08/25/21  1:01 PM   Specimen: Vein; Blood  Result Value Ref Range Status   Specimen Description PENDING  Incomplete   Special Requests   Final    BOTTLES DRAWN AEROBIC ONLY RIGHT ANTECUBITAL Blood Culture adequate volume   Culture   Final    NO GROWTH 3 DAYS Performed at [facility], [address], [place], [place] [address]    Report Status PENDING  Incomplete  Urine Culture     Status: Abnormal   Collection Time: 08/25/21  6:24 PM   Specimen: Urine, Clean Catch  Result Value Ref Range Status   Specimen Description   Final    URINE, CLEAN CATCH Performed at [facility], [address], [place], [place] [address]    Special Requests   Final    NONE Performed at [facility], [address] [place], [place] [address]    Culture 20,000 COLONIES/mL ESCHERICHIA COLI (A)  Final   Report Status 08/27/2021 FINAL  Final   Organism ID, Bacteria ESCHERICHIA COLI (A)  Final      Susceptibility   Escherichia coli - MIC*    AMPICILLIN <=2 SENSITIVE Sensitive     CEFAZOLIN <=4 SENSITIVE Sensitive     CEFEPIME <=0.12 SENSITIVE Sensitive     CEFTRIAXONE <=0.25 SENSITIVE Sensitive     CIPROFLOXACIN <=0.25 SENSITIVE Sensitive     GENTAMICIN <=1 SENSITIVE Sensitive     IMIPENEM <=0.25 SENSITIVE Sensitive     NITROFURANTOIN <=16 SENSITIVE Sensitive     TRIMETH/SULFA <=20 SENSITIVE Sensitive     AMPICILLIN/SULBACTAM <=2 SENSITIVE Sensitive     PIP/TAZO <=4 SENSITIVE Sensitive     * 20,000 COLONIES/mL ESCHERICHIA COLI     Labs: BNP (last 3 results) No results for input(s): "BNP" in the last 8760 hours. Basic Metabolic Panel: Recent Labs  Lab 08/25/21 1233 08/26/21 0412 08/27/21 0502 08/28/21 0514  NA 137 139 140  --   K 4.4 4.2 3.5  --   CL 106 109 109  --   CO2 '23 22 24  '$ --   GLUCOSE 134* 72 98  --   BUN 60* 43* 21  --   CREATININE 4.48* 2.43* 1.30*  --   CALCIUM 7.9* 8.3* 8.5*  --   MG  --    --  1.4* 1.9   Liver Function Tests: Recent Labs  Lab 08/25/21 1233  AST 43*  ALT 20  ALKPHOS 137*  BILITOT 0.8  PROT 7.1  ALBUMIN 3.6   No results for input(s): "LIPASE", "AMYLASE" in the last 168 hours. No results for input(s): "AMMONIA" in the last 168 hours. CBC: Recent Labs  Lab 08/25/21 1204 08/26/21 0412  WBC 105.4* 100.3*  HGB 6.3* 9.0*  HCT 19.8* 26.8*  MCV 90.8 88.7  PLT 70* 72*   Cardiac Enzymes: No results for input(s): "CKTOTAL", "CKMB", "CKMBINDEX", "TROPONINI" in the last 168 hours. BNP: Invalid input(s): "POCBNP" CBG: Recent Labs  Lab 08/25/21 1228  GLUCAP 126*   D-Dimer No results for input(s): "DDIMER" in the last 72 hours. Hgb A1c No results for input(s): "HGBA1C" in the last 72 hours. Lipid Profile No results for input(s): "CHOL", "HDL", "LDLCALC", "TRIG", "CHOLHDL", "LDLDIRECT" in the last 72 hours. Thyroid function studies No results for input(s): "TSH", "T4TOTAL", "T3FREE", "THYROIDAB" in the last 72 hours.  Invalid input(s): "FREET3" Anemia work up No results for input(s): "VITAMINB12", "  FOLATE", "FERRITIN", "TIBC", "IRON", "RETICCTPCT" in the last 72 hours. Urinalysis    Component Value Date/Time   COLORURINE YELLOW 08/25/2021 1824   APPEARANCEUR CLOUDY (A) 08/25/2021 1824   LABSPEC 1.010 08/25/2021 1824   PHURINE 7.0 08/25/2021 1824   GLUCOSEU NEGATIVE 08/25/2021 1824   GLUCOSEU NEG mg/dL 05/26/2009 2225   HGBUR MODERATE (A) 08/25/2021 1824   HGBUR negative 08/19/2008 1124   BILIRUBINUR NEGATIVE 08/25/2021 1824   BILIRUBINUR neg 08/15/2017 0955   KETONESUR NEGATIVE 08/25/2021 1824   PROTEINUR 30 (A) 08/25/2021 1824   UROBILINOGEN 1.0 08/15/2017 0955   UROBILINOGEN 0.2 12/05/2012 1150   NITRITE POSITIVE (A) 08/25/2021 1824   LEUKOCYTESUR LARGE (A) 08/25/2021 1824   Sepsis Labs Recent Labs  Lab 08/25/21 1204 08/26/21 0412  WBC 105.4* 100.3*   Microbiology Recent Results (from the past 240 hour(s))  Blood Culture  (routine x 2)     Status: None (Preliminary result)   Collection Time: 08/25/21 12:59 PM   Specimen: BLOOD  Result Value Ref Range Status   Specimen Description BLOOD  Final   Special Requests   Final    BOTTLES DRAWN AEROBIC ONLY BLOOD RIGHT HAND Blood Culture results may not be optimal due to an inadequate volume of blood received in culture bottles   Culture   Final    NO GROWTH 3 DAYS Performed at [facility] [address], [place], [place] [address]    Report Status PENDING  Incomplete  Blood Culture (routine x 2)     Status: None (Preliminary result)   Collection Time: 08/25/21  1:01 PM   Specimen: Vein; Blood  Result Value Ref Range Status   Specimen Description PENDING  Incomplete   Special Requests   Final    BOTTLES DRAWN AEROBIC ONLY RIGHT ANTECUBITAL Blood Culture adequate volume   Culture   Final    NO GROWTH 3 DAYS Performed at [facility], [address] [place], [place] [address]    Report Status PENDING  Incomplete  Urine Culture     Status: Abnormal   Collection Time: 08/25/21  6:24 PM   Specimen: Urine, Clean Catch  Result Value Ref Range Status   Specimen Description   Final    URINE, CLEAN CATCH Performed at [facility], [address] [place], [place] [address]    Special Requests   Final    NONE Performed at [facility], [address], [place], [place] [address]    Culture 20,000 COLONIES/mL ESCHERICHIA COLI (A)  Final   Report Status 08/27/2021 FINAL  Final   Organism ID, Bacteria ESCHERICHIA COLI (A)  Final      Susceptibility   Escherichia coli - MIC*    AMPICILLIN <=2 SENSITIVE Sensitive     CEFAZOLIN <=4 SENSITIVE Sensitive     CEFEPIME <=0.12 SENSITIVE Sensitive     CEFTRIAXONE <=0.25 SENSITIVE Sensitive     CIPROFLOXACIN <=0.25 SENSITIVE Sensitive     GENTAMICIN <=1 SENSITIVE Sensitive     IMIPENEM <=0.25 SENSITIVE Sensitive     NITROFURANTOIN <=16 SENSITIVE Sensitive     TRIMETH/SULFA <=20 SENSITIVE Sensitive      AMPICILLIN/SULBACTAM <=2 SENSITIVE Sensitive     PIP/TAZO <=4 SENSITIVE Sensitive     * 20,000 COLONIES/mL ESCHERICHIA COLI   Time coordinating discharge: 35 mins   SIGNED:  Irwin Brakeman, MD  [facility] 08/28/2021, 11:49 AM How to contact the [facility] Attending or Consulting provider [place] or covering provider during after hours [place], for this patient?  Check the care team in [facility] and look for a) attending/consulting [facility] provider listed and b) the [facility] team listed Log into [email or web address] and use [place]'s universal password to access. If you do not have the password, please contact the hospital operator. Locate the [facility] provider you are looking for under [facility] and page to a number that you can be directly reached. If you still have difficulty reaching the provider, please page the [facility] (Director on Call) for the Hospitalists listed on amion for assistance.

## 2021-08-28 NOTE — Progress Notes (Addendum)
Port deaccessed and DC instructions reviewed.  Scripts sent to pharmacy and to follow up with primary md.  Family present and to drive home

## 2021-08-28 NOTE — Discharge Instructions (Signed)
IMPORTANT INFORMATION: PAY CLOSE ATTENTION   PHYSICIAN DISCHARGE INSTRUCTIONS  Follow with Primary care provider  Simpson, Margaret E, MD  and other consultants as instructed by your Hospitalist Physician  SEEK MEDICAL CARE OR RETURN TO EMERGENCY ROOM IF SYMPTOMS COME BACK, WORSEN OR NEW PROBLEM DEVELOPS   Please note: You were cared for by a hospitalist during your hospital stay. Every effort will be made to forward records to your primary care provider.  You can request that your primary care provider send for your hospital records if they have not received them.  Once you are discharged, your primary care physician will handle any further medical issues. Please note that NO REFILLS for any discharge medications will be authorized once you are discharged, as it is imperative that you return to your primary care physician (or establish a relationship with a primary care physician if you do not have one) for your post hospital discharge needs so that they can reassess your need for medications and monitor your lab values.  Please get a complete blood count and chemistry panel checked by your Primary MD at your next visit, and again as instructed by your Primary MD.  Get Medicines reviewed and adjusted: Please take all your medications with you for your next visit with your Primary MD  Laboratory/radiological data: Please request your Primary MD to go over all hospital tests and procedure/radiological results at the follow up, please ask your primary care provider to get all Hospital records sent to his/her office.  In some cases, they will be blood work, cultures and biopsy results pending at the time of your discharge. Please request that your primary care provider follow up on these results.  If you are diabetic, please bring your blood sugar readings with you to your follow up appointment with primary care.    Please call and make your follow up appointments as soon as possible.    Also  Note the following: If you experience worsening of your admission symptoms, develop shortness of breath, life threatening emergency, suicidal or homicidal thoughts you must seek medical attention immediately by calling 911 or calling your MD immediately  if symptoms less severe.  You must read complete instructions/literature along with all the possible adverse reactions/side effects for all the Medicines you take and that have been prescribed to you. Take any new Medicines after you have completely understood and accpet all the possible adverse reactions/side effects.   Do not drive when taking Pain medications or sleeping medications (Benzodiazepines)  Do not take more than prescribed Pain, Sleep and Anxiety Medications. It is not advisable to combine anxiety,sleep and pain medications without talking with your primary care practitioner  Special Instructions: If you have smoked or chewed Tobacco  in the last 2 yrs please stop smoking, stop any regular Alcohol  and or any Recreational drug use.  Wear Seat belts while driving.  Do not drive if taking any narcotic, mind altering or controlled substances or recreational drugs or alcohol.       

## 2021-08-30 ENCOUNTER — Telehealth: Payer: Self-pay | Admitting: *Deleted

## 2021-08-30 ENCOUNTER — Other Ambulatory Visit: Payer: Self-pay | Admitting: Family Medicine

## 2021-08-30 ENCOUNTER — Other Ambulatory Visit (HOSPITAL_COMMUNITY): Payer: Self-pay | Admitting: Hematology

## 2021-08-30 ENCOUNTER — Other Ambulatory Visit: Payer: Self-pay | Admitting: Gastroenterology

## 2021-08-30 LAB — CULTURE, BLOOD (ROUTINE X 2): Culture: NO GROWTH

## 2021-08-30 NOTE — Telephone Encounter (Signed)
Transition Care Management Follow-up Telephone Call Date of discharge and from where: 08-28-21  [name] How have you been since you were released from the hospital? Not the greatest needs some help with home care Any questions or concerns? Yes  Items Reviewed: Did the pt receive and understand the discharge instructions provided? Yes  Medications obtained and verified? Yes  Other? No  Any new allergies since your discharge? No  Dietary orders reviewed? Yes Do you have support at home? Yes   Home Care and Equipment/Supplies: Were home health services ordered? not applicable If so, what is the name of the agency? NA  Has the agency set up a time to come to the patient's home? not applicable Were any new equipment or medical supplies ordered?  No What is the name of the medical supply agency? NA Were you able to get the supplies/equipment? not applicable Do you have any questions related to the use of the equipment or supplies? No  Functional Questionnaire: (I = Independent and D = Dependent) ADLs: i  Bathing/Dressing- i  Meal Prep- i  Eating- i  Maintaining continence- i  Transferring/Ambulation- i  Managing Meds- i  Follow up appointments reviewed:  PCP Hospital f/u appt confirmed? Yes  Scheduled to see [facility] on 09-02-21 . [facility] f/u appt confirmed? Yes   If their condition worsens, is the pt aware to call PCP or go to the Emergency Dept.? Yes Was the patient provided with contact information for the PCP's office or ED?Yes Was to pt encouraged to call back with questions or concerns? Yes

## 2021-08-31 ENCOUNTER — Telehealth: Payer: Self-pay | Admitting: Family Medicine

## 2021-08-31 ENCOUNTER — Encounter (HOSPITAL_COMMUNITY): Payer: Self-pay | Admitting: Emergency Medicine

## 2021-08-31 ENCOUNTER — Inpatient Hospital Stay (HOSPITAL_COMMUNITY)
Admission: EM | Admit: 2021-08-31 | Discharge: 2021-09-03 | DRG: 682 | Disposition: A | Discharge status: Home Health | Payer: Medicare Other | Attending: Internal Medicine | Admitting: Internal Medicine

## 2021-08-31 ENCOUNTER — Emergency Department (HOSPITAL_COMMUNITY): Payer: Medicare Other

## 2021-08-31 ENCOUNTER — Ambulatory Visit: Payer: Medicare Other

## 2021-08-31 ENCOUNTER — Other Ambulatory Visit: Payer: Self-pay

## 2021-08-31 DIAGNOSIS — R6510 Systemic inflammatory response syndrome (SIRS) of non-infectious origin without acute organ dysfunction: Secondary | ICD-10-CM | POA: Diagnosis not present

## 2021-08-31 DIAGNOSIS — Z20822 Contact with and (suspected) exposure to covid-19: Secondary | ICD-10-CM | POA: Diagnosis present

## 2021-08-31 DIAGNOSIS — Z66 Do not resuscitate: Secondary | ICD-10-CM | POA: Diagnosis present

## 2021-08-31 DIAGNOSIS — I129 Hypertensive chronic kidney disease with stage 1 through stage 4 chronic kidney disease, or unspecified chronic kidney disease: Secondary | ICD-10-CM | POA: Diagnosis present

## 2021-08-31 DIAGNOSIS — K219 Gastro-esophageal reflux disease without esophagitis: Secondary | ICD-10-CM | POA: Diagnosis present

## 2021-08-31 DIAGNOSIS — M329 Systemic lupus erythematosus, unspecified: Secondary | ICD-10-CM | POA: Diagnosis present

## 2021-08-31 DIAGNOSIS — I10 Essential (primary) hypertension: Secondary | ICD-10-CM | POA: Diagnosis not present

## 2021-08-31 DIAGNOSIS — G319 Degenerative disease of nervous system, unspecified: Secondary | ICD-10-CM | POA: Diagnosis not present

## 2021-08-31 DIAGNOSIS — Y92009 Unspecified place in unspecified non-institutional (private) residence as the place of occurrence of the external cause: Secondary | ICD-10-CM | POA: Diagnosis not present

## 2021-08-31 DIAGNOSIS — C946 Myelodysplastic disease, not classified: Secondary | ICD-10-CM | POA: Diagnosis present

## 2021-08-31 DIAGNOSIS — F324 Major depressive disorder, single episode, in partial remission: Secondary | ICD-10-CM | POA: Diagnosis not present

## 2021-08-31 DIAGNOSIS — Z79899 Other long term (current) drug therapy: Secondary | ICD-10-CM | POA: Diagnosis not present

## 2021-08-31 DIAGNOSIS — R0989 Other specified symptoms and signs involving the circulatory and respiratory systems: Secondary | ICD-10-CM | POA: Diagnosis present

## 2021-08-31 DIAGNOSIS — D696 Thrombocytopenia, unspecified: Secondary | ICD-10-CM | POA: Diagnosis present

## 2021-08-31 DIAGNOSIS — J9601 Acute respiratory failure with hypoxia: Secondary | ICD-10-CM

## 2021-08-31 DIAGNOSIS — F419 Anxiety disorder, unspecified: Secondary | ICD-10-CM | POA: Diagnosis not present

## 2021-08-31 DIAGNOSIS — Z96641 Presence of right artificial hip joint: Secondary | ICD-10-CM | POA: Diagnosis present

## 2021-08-31 DIAGNOSIS — N179 Acute kidney failure, unspecified: Secondary | ICD-10-CM | POA: Diagnosis not present

## 2021-08-31 DIAGNOSIS — F32A Depression, unspecified: Secondary | ICD-10-CM | POA: Diagnosis present

## 2021-08-31 DIAGNOSIS — J90 Pleural effusion, not elsewhere classified: Secondary | ICD-10-CM | POA: Diagnosis not present

## 2021-08-31 DIAGNOSIS — Z91030 Bee allergy status: Secondary | ICD-10-CM | POA: Diagnosis not present

## 2021-08-31 DIAGNOSIS — W19XXXA Unspecified fall, initial encounter: Secondary | ICD-10-CM | POA: Diagnosis not present

## 2021-08-31 DIAGNOSIS — J189 Pneumonia, unspecified organism: Secondary | ICD-10-CM | POA: Diagnosis present

## 2021-08-31 DIAGNOSIS — D630 Anemia in neoplastic disease: Secondary | ICD-10-CM | POA: Diagnosis present

## 2021-08-31 DIAGNOSIS — Z885 Allergy status to narcotic agent status: Secondary | ICD-10-CM

## 2021-08-31 DIAGNOSIS — S0990XA Unspecified injury of head, initial encounter: Secondary | ICD-10-CM | POA: Diagnosis not present

## 2021-08-31 DIAGNOSIS — Z888 Allergy status to other drugs, medicaments and biological substances status: Secondary | ICD-10-CM

## 2021-08-31 DIAGNOSIS — I959 Hypotension, unspecified: Secondary | ICD-10-CM | POA: Diagnosis present

## 2021-08-31 DIAGNOSIS — D471 Chronic myeloproliferative disease: Secondary | ICD-10-CM | POA: Diagnosis not present

## 2021-08-31 DIAGNOSIS — Z87891 Personal history of nicotine dependence: Secondary | ICD-10-CM | POA: Diagnosis not present

## 2021-08-31 DIAGNOSIS — N1831 Chronic kidney disease, stage 3a: Secondary | ICD-10-CM | POA: Diagnosis present

## 2021-08-31 DIAGNOSIS — Z8249 Family history of ischemic heart disease and other diseases of the circulatory system: Secondary | ICD-10-CM

## 2021-08-31 DIAGNOSIS — I700 Atherosclerosis of aorta: Secondary | ICD-10-CM | POA: Diagnosis not present

## 2021-08-31 DIAGNOSIS — J439 Emphysema, unspecified: Secondary | ICD-10-CM | POA: Diagnosis not present

## 2021-08-31 DIAGNOSIS — R001 Bradycardia, unspecified: Secondary | ICD-10-CM | POA: Diagnosis not present

## 2021-08-31 DIAGNOSIS — I951 Orthostatic hypotension: Secondary | ICD-10-CM

## 2021-08-31 DIAGNOSIS — Y95 Nosocomial condition: Secondary | ICD-10-CM | POA: Diagnosis present

## 2021-08-31 DIAGNOSIS — G4700 Insomnia, unspecified: Secondary | ICD-10-CM | POA: Diagnosis present

## 2021-08-31 DIAGNOSIS — M25551 Pain in right hip: Secondary | ICD-10-CM | POA: Diagnosis not present

## 2021-08-31 DIAGNOSIS — Z886 Allergy status to analgesic agent status: Secondary | ICD-10-CM | POA: Diagnosis not present

## 2021-08-31 DIAGNOSIS — K2100 Gastro-esophageal reflux disease with esophagitis, without bleeding: Secondary | ICD-10-CM | POA: Diagnosis not present

## 2021-08-31 DIAGNOSIS — I272 Pulmonary hypertension, unspecified: Secondary | ICD-10-CM | POA: Diagnosis not present

## 2021-08-31 LAB — CBC
HCT: 24.2 % — ABNORMAL LOW (ref 36.0–46.0)
Hemoglobin: 7.9 g/dL — ABNORMAL LOW (ref 12.0–15.0)
MCH: 30.3 pg (ref 26.0–34.0)
MCHC: 32.6 g/dL (ref 30.0–36.0)
MCV: 92.7 fL (ref 80.0–100.0)
Platelets: 77 10*3/uL — ABNORMAL LOW (ref 150–400)
RBC: 2.61 MIL/uL — ABNORMAL LOW (ref 3.87–5.11)
RDW: 21.5 % — ABNORMAL HIGH (ref 11.5–15.5)
WBC: 100.7 10*3/uL (ref 4.0–10.5)
nRBC: 2.9 % — ABNORMAL HIGH (ref 0.0–0.2)

## 2021-08-31 LAB — APTT: aPTT: 168 seconds (ref 24–36)

## 2021-08-31 LAB — BASIC METABOLIC PANEL
Anion gap: 5 (ref 5–15)
BUN: 41 mg/dL — ABNORMAL HIGH (ref 8–23)
CO2: 25 mmol/L (ref 22–32)
Calcium: 7.6 mg/dL — ABNORMAL LOW (ref 8.9–10.3)
Chloride: 104 mmol/L (ref 98–111)
Creatinine, Ser: 2.34 mg/dL — ABNORMAL HIGH (ref 0.44–1.00)
GFR, Estimated: 21 mL/min — ABNORMAL LOW (ref >60)
Glucose, Bld: 109 mg/dL — ABNORMAL HIGH (ref 70–99)
Potassium: 3.8 mmol/L (ref 3.5–5.1)
Sodium: 134 mmol/L — ABNORMAL LOW (ref 135–145)

## 2021-08-31 LAB — LACTIC ACID, PLASMA
Lactic Acid, Venous: 0.6 mmol/L (ref 0.5–1.9)
Lactic Acid, Venous: 0.6 mmol/L (ref 0.5–1.9)

## 2021-08-31 LAB — TROPONIN I (HIGH SENSITIVITY)
Troponin I (High Sensitivity): 7 ng/L (ref <18)
Troponin I (High Sensitivity): 8 ng/L (ref <18)

## 2021-08-31 LAB — URIC ACID: Uric Acid, Serum: 4.2 mg/dL (ref 2.5–7.1)

## 2021-08-31 LAB — PROCALCITONIN: Procalcitonin: 0.74 ng/mL

## 2021-08-31 LAB — MAGNESIUM: Magnesium: 2 mg/dL (ref 1.7–2.4)

## 2021-08-31 LAB — CBG MONITORING, ED: Glucose-Capillary: 99 mg/dL (ref 70–99)

## 2021-08-31 LAB — PROTIME-INR
INR: 1.4 — ABNORMAL HIGH (ref 0.8–1.2)
Prothrombin Time: 17.3 seconds — ABNORMAL HIGH (ref 11.4–15.2)

## 2021-08-31 LAB — PHOSPHORUS: Phosphorus: 4.2 mg/dL (ref 2.5–4.6)

## 2021-08-31 MED ORDER — TEMAZEPAM 15 MG PO CAPS
30.0000 mg | ORAL_CAPSULE | Freq: Every evening | ORAL | Status: DC | PRN
  Administered 2021-08-31 – 2021-09-02 (×3): 30 mg via ORAL
  Filled 2021-08-31: qty 2
  Filled 2021-08-31: qty 4
  Filled 2021-08-31: qty 2

## 2021-08-31 MED ORDER — ONDANSETRON HCL 4 MG/2ML IJ SOLN: 4.0000 mg | Freq: Four times a day (QID) | INTRAMUSCULAR | Status: DC | PRN

## 2021-08-31 MED ORDER — ONDANSETRON HCL 4 MG PO TABS: 4.0000 mg | ORAL_TABLET | Freq: Four times a day (QID) | ORAL | Status: DC | PRN

## 2021-08-31 MED ORDER — HEPARIN SODIUM (PORCINE) 5000 UNIT/ML IJ SOLN
5000.0000 [IU] | Freq: Three times a day (TID) | INTRAMUSCULAR | Status: DC
  Administered 2021-09-01 – 2021-09-03 (×6): 5000 [IU] via SUBCUTANEOUS
  Filled 2021-08-31 (×7): qty 1

## 2021-08-31 MED ORDER — SODIUM CHLORIDE 0.9 % IV SOLN: INTRAVENOUS | Status: DC

## 2021-08-31 MED ORDER — MIRTAZAPINE 15 MG PO TABS
15.0000 mg | ORAL_TABLET | Freq: Every day | ORAL | Status: DC
  Administered 2021-08-31 – 2021-09-02 (×3): 15 mg via ORAL
  Filled 2021-08-31 (×3): qty 1

## 2021-08-31 MED ORDER — OXYCODONE HCL 5 MG PO TABS
5.0000 mg | ORAL_TABLET | ORAL | Status: DC | PRN
  Administered 2021-08-31 – 2021-09-03 (×7): 5 mg via ORAL
  Filled 2021-08-31 (×7): qty 1

## 2021-08-31 MED ORDER — LORAZEPAM 0.5 MG PO TABS
0.5000 mg | ORAL_TABLET | Freq: Once | ORAL | Status: AC
  Administered 2021-08-31: 0.5 mg via ORAL
  Filled 2021-08-31: qty 1

## 2021-08-31 MED ORDER — CALCIUM GLUCONATE-NACL 1-0.675 GM/50ML-% IV SOLN
1.0000 g | Freq: Once | INTRAVENOUS | Status: AC
  Administered 2021-08-31: 1000 mg via INTRAVENOUS
  Filled 2021-08-31: qty 50

## 2021-08-31 MED ORDER — GABAPENTIN 100 MG PO CAPS
100.0000 mg | ORAL_CAPSULE | Freq: Every day | ORAL | Status: DC
Start: 2021-08-31 — End: 2021-09-03
  Administered 2021-08-31 – 2021-09-02 (×3): 100 mg via ORAL
  Filled 2021-08-31 (×3): qty 1

## 2021-08-31 MED ORDER — RUXOLITINIB PHOSPHATE 15 MG PO TABS: 15.0000 mg | ORAL_TABLET | Freq: Two times a day (BID) | ORAL | Status: DC

## 2021-08-31 MED ORDER — CITALOPRAM HYDROBROMIDE 20 MG PO TABS
20.0000 mg | ORAL_TABLET | Freq: Every day | ORAL | Status: DC
  Administered 2021-09-01 – 2021-09-03 (×3): 20 mg via ORAL
  Filled 2021-08-31 (×3): qty 1

## 2021-08-31 MED ORDER — SODIUM CHLORIDE 0.9 % IV BOLUS: 1000.0000 mL | Freq: Once | INTRAVENOUS | Status: DC

## 2021-08-31 MED ORDER — PANTOPRAZOLE SODIUM 40 MG PO TBEC
40.0000 mg | DELAYED_RELEASE_TABLET | Freq: Every day | ORAL | Status: DC
Start: 2021-09-01 — End: 2021-09-03
  Administered 2021-09-01 – 2021-09-03 (×3): 40 mg via ORAL
  Filled 2021-08-31 (×3): qty 1

## 2021-08-31 MED ORDER — POLYETHYLENE GLYCOL 3350 17 G PO PACK
17.0000 g | PACK | Freq: Every day | ORAL | Status: DC | PRN
  Administered 2021-09-01 – 2021-09-02 (×2): 17 g via ORAL
  Filled 2021-08-31 (×2): qty 1

## 2021-08-31 MED ORDER — TIZANIDINE HCL 4 MG PO TABS
8.0000 mg | ORAL_TABLET | Freq: Three times a day (TID) | ORAL | Status: DC | PRN
  Administered 2021-09-01 – 2021-09-03 (×5): 8 mg via ORAL
  Filled 2021-08-31 (×5): qty 2

## 2021-08-31 MED ORDER — ACETAMINOPHEN 650 MG RE SUPP: 650.0000 mg | Freq: Four times a day (QID) | RECTAL | Status: DC | PRN

## 2021-08-31 MED ORDER — LACTATED RINGERS IV BOLUS
1000.0000 mL | Freq: Once | INTRAVENOUS | Status: AC
  Administered 2021-08-31: 1000 mL via INTRAVENOUS

## 2021-08-31 MED ORDER — ACETAMINOPHEN 325 MG PO TABS: 650.0000 mg | ORAL_TABLET | Freq: Four times a day (QID) | ORAL | Status: DC | PRN

## 2021-08-31 NOTE — ED Notes (Signed)
ED TO ONCOMING RN  Harlin Rain RN  S Name/Age/Gender Renee Waters 76 y.o. female Room/Bed: APA10/APA10  Code Status   Home/SNF/Other Home Patient oriented to: self Is this baseline? Yes   Triage Complete: Triage complete  Chief Complaint low blood pressure  Triage Note Pt presents with symptoms of numbness and tingling in left side and hypotensive, B/P taken at home and PCP office.   Allergies Allergies  Allergen Reactions   Bee Venom Swelling and Hives   Acetaminophen     itches   Amlodipine     Hair loss   Tyloxapol    Other Itching, Rash and Swelling   Oxycodone-Acetaminophen Rash    Pt states, "this gives her a rash, but at home she takes oxycodone for pain relief" Pt states, "this gives her a rash, but at home she takes oxycodone for pain relief"    Level of Care/Admitting Diagnosis ED Disposition     None       B Medical/Surgery History Past Medical History:  Diagnosis Date   Acute cholangitis    Allergy    Anemia    Anxiety    Arthritis    Phreesia 03/18/2020   Barrett's esophagus    Cataract    Chronic back pain    Chronic neck pain    CKD (chronic kidney disease) stage 3, GFR 30-59 ml/min ([place]) 10/29/2020   Depression    Genital herpes    GERD (gastroesophageal reflux disease)    H/O degenerative disc disease    History of blood transfusion    Hypertension    Insomnia    Lupus (systemic lupus erythematosus) ([place])    Neuromuscular disorder ([place])    Osteoarthritis    S/P colonoscopy June 2005   normal, no polyps   S/P endoscopy June 2005, Oct 2009   2005: short-segment Barrett's, 2009: short-segment Barrett's   Upper respiratory tract infection 07/09/2020   UTI (lower urinary tract infection) 11/2012   Past Surgical History:  Procedure Laterality Date   ABDOMINAL HYSTERECTOMY     BACK SURGERY     BIOPSY N/A 03/20/2014   Procedure: BIOPSY;  Surgeon: Daneil Dolin, MD;  Location: [facility];  Service: Endoscopy;  Laterality: N/A;    BIOPSY  09/14/2015   Procedure: BIOPSY;  Surgeon: Daneil Dolin, MD;  Location: [facility];  Service: Endoscopy;;  esophageal and gastric   BIOPSY  07/23/2019   Procedure: BIOPSY;  Surgeon: Daneil Dolin, MD;  Location[facility];  Service: Endoscopy;;  esophageal    CARPAL TUNNEL RELEASE Left 2013   cervical disectomy  2002   CESAREAN SECTION N/A    Phreesia 03/18/2020   CHOLECYSTECTOMY     with lysis of adhesions for sbo; "ruptured gallbladder".   COLONOSCOPY  11/09/2011   RMR: Melanosis coli   COLONOSCOPY WITH PROPOFOL N/A 09/14/2015   Dr. Gala Romney: diverticulosis, 39m TA removed. next TCS 09/2020.    COLONOSCOPY WITH PROPOFOL N/A 04/30/2020   Procedure: COLONOSCOPY WITH PROPOFOL;  Surgeon: RDaneil Dolin MD;  [facility];  Service: Endoscopy;  Laterality: N/A;  PM (ASA 3)   DENTAL SURGERY  11/2015   multiple tooth extraction   ESOPHAGOGASTRODUODENOSCOPY  11/29/2007   salmon-colored  tongue   longest stable at  3 cm, distal esophagus as described previously status post biopsy/ Hiatal hernia, otherwise normal stomach D1 and D2   ESOPHAGOGASTRODUODENOSCOPY  01/06/11   short segment Barrett's esophagus s/p bx/Hiatal hernia   ESOPHAGOGASTRODUODENOSCOPY (EGD) WITH  PROPOFOL N/A 03/20/2014   KGM:[id number] distal esophagus short segment barrett's, bx with no dysplasia. next egd in 03/2017   ESOPHAGOGASTRODUODENOSCOPY (EGD) WITH PROPOFOL N/A 09/14/2015   Dr. Gala Romney: Barrett's without dysplasia, gastritis benign bx, hiatal hernia. next EGD 09/2018.   ESOPHAGOGASTRODUODENOSCOPY (EGD) WITH PROPOFOL N/A 07/23/2019   Procedure: ESOPHAGOGASTRODUODENOSCOPY (EGD) WITH PROPOFOL;  Surgeon: Daneil Dolin, MD;  [facility];  Service: Endoscopy;  Laterality: N/A;  3:00pm   EYE SURGERY N/A    Phreesia 03/18/2020   HERNIA REPAIR Right 07/2010   Dr. Zada Girt   IR IMAGING GUIDED PORT INSERTION  02/09/2021   JOINT REPLACEMENT     LAPAROSCOPIC CHOLECYSTECTOMY  2017   at [facility]    POLYPECTOMY  09/14/2015   Procedure: POLYPECTOMY;  Surgeon: Daneil Dolin, MD;  Location[facility];  Service: Endoscopy;;  ascending colon   right hip replacement  07/2010   went back in sept 2012 to fix   SHOULDER ARTHROSCOPY  2008   left   SPINE SURGERY N/A    Phreesia 03/18/2020   TOTAL HIP REVISION Right 12/17/2012   Procedure: RIGHT TOTAL HIP REVISION;  Surgeon: Mauri Pole, MD;  Location: [facility];  Service: Orthopedics;  Laterality: Right;   WRIST SURGERY Right 2011   open reduction right wrist.     A IV Location/Drains/Wounds Patient Lines/Drains/Airways Status     Active Line/Drains/Airways     Name Placement date Placement time Site Days   Implanted Port 02/09/21 Right Chest 02/09/21  1503  Chest  203   External Urinary Catheter 08/31/21  1828  --  less than 1   Incision 12/17/12 Hip Right 12/17/12  1444  -- 3179            Intake/Output Last 24 hours No intake or output data in the 24 hours ending 08/31/21 1901  Labs/Imaging Results for orders placed or performed during the hospital encounter of 08/31/21 (from the past 48 hour(s))  CBG monitoring, ED     Status: None   Collection Time: 08/31/21  3:18 PM  Result Value Ref Range   Glucose-Capillary 99 70 - 99 mg/dL    Comment: Glucose reference range applies only to samples taken after fasting for at least 8 hours.   Comment 1 Notify RN   Basic metabolic panel     Status: Abnormal   Collection Time: 08/31/21  3:33 PM  Result Value Ref Range   Sodium 134 (L) 135 - 145 mmol/L   Potassium 3.8 3.5 - 5.1 mmol/L   Chloride 104 98 - 111 mmol/L   CO2 25 22 - 32 mmol/L   Glucose, Bld 109 (H) 70 - 99 mg/dL    Comment: Glucose reference range applies only to samples taken after fasting for at least 8 hours.   BUN 41 (H) 8 - 23 mg/dL   Creatinine, Ser 2.34 (H) 0.44 - 1.00 mg/dL   Calcium 7.6 (L) 8.9 - 10.3 mg/dL   GFR, Estimated 21 (L) >60 mL/min    Comment: (NOTE) Calculated using the CKD-EPI Creatinine  Equation (2021)    Anion gap 5 5 - 15    Comment: Performed at [facility], [address], [place], [place] [address]  CBC     Status: Abnormal   Collection Time: 08/31/21  3:33 PM  Result Value Ref Range   WBC 100.7 (HH) 4.0 - 10.5 K/uL    Comment: This critical result has verified and been called to [facility] Marqueze Ramcharan by [facility]  [place] on 07 25 2023 at 1556, and has been read back. CRITICAL RESULTS VERIFIED   RBC 2.61 (L) 3.87 - 5.11 MIL/uL   Hemoglobin 7.9 (L) 12.0 - 15.0 g/dL   HCT 24.2 (L) 36.0 - 46.0 %   MCV 92.7 80.0 - 100.0 fL   MCH 30.3 26.0 - 34.0 pg   MCHC 32.6 30.0 - 36.0 g/dL   RDW 21.5 (H) 11.5 - 15.5 %   Platelets 77 (L) 150 - 400 K/uL    Comment: SPECIMEN CHECKED FOR CLOTS Immature Platelet Fraction may be clinically indicated, consider ordering this additional test [id number] PLATELET COUNT CONFIRMED BY SMEAR    nRBC 2.9 (H) 0.0 - 0.2 %    Comment: Performed at [facility], [address], [place], [place] [address]  Phosphorus     Status: None   Collection Time: 08/31/21  3:47 PM  Result Value Ref Range   Phosphorus 4.2 2.5 - 4.6 mg/dL    Comment: Performed at [facility], [address], [place], [place] [address]  Magnesium     Status: None   Collection Time: 08/31/21  3:47 PM  Result Value Ref Range   Magnesium 2.0 1.7 - 2.4 mg/dL    Comment: Performed at [facility], [address], [place], [place] [address]  Uric acid     Status: None   Collection Time: 08/31/21  3:47 PM  Result Value Ref Range   Uric Acid, Serum 4.2 2.5 - 7.1 mg/dL    Comment: Performed at [facility], [address] [place], [place] [address]  Protime-INR     Status: Abnormal   Collection Time: 08/31/21  3:47 PM  Result Value Ref Range   Prothrombin Time 17.3 (H) 11.4 - 15.2 seconds   INR 1.4 (H) 0.8 - 1.2    Comment: (NOTE) INR goal varies based on device and disease states. Performed at [facility], [address], [place], [place] [address]   APTT     Status: Abnormal    Collection Time: 08/31/21  3:47 PM  Result Value Ref Range   aPTT 168 (HH) 24 - 36 seconds    Comment:        IF BASELINE aPTT IS ELEVATED, SUGGEST PATIENT RISK ASSESSMENT BE USED TO DETERMINE APPROPRIATE ANTICOAGULANT THERAPY. REPEATED TO VERIFY CRITICAL RESULT CALLED TO, READ BACK BY AND VERIFIED WITH: Anner Baity @ [phone] ON 08/31/21 C VARNER Performed at [facility], [address] [place], [place] [address]   Troponin I (High Sensitivity)     Status: None   Collection Time: 08/31/21  3:47 PM  Result Value Ref Range   Troponin I (High Sensitivity) 7 <18 ng/L    Comment: (NOTE) Elevated high sensitivity troponin I (hsTnI) values and significant  changes across serial measurements may suggest ACS but many other  chronic and acute conditions are known to elevate hsTnI results.  Refer to the "Links" section for chest pain algorithms and additional  guidance. Performed at [facility], [address] [place], [place] [address]   Blood culture (routine x 2)     Status: None (Preliminary result)   Collection Time: 08/31/21  3:49 PM   Specimen: BLOOD LEFT HAND  Result Value Ref Range   Specimen Description BLOOD LEFT HAND    Special Requests      Immunocompromised BOTTLES DRAWN AEROBIC ONLY Blood Culture results may not be optimal due to an inadequate volume of blood received in culture bottles Performed at [facility], [address] [place], [place] [address]    Culture PENDING    Report Status PENDING   Lactic acid, plasma     Status: None   Collection Time: 08/31/21  3:50 PM  Result Value Ref Range   Lactic Acid, Venous 0.6 0.5 - 1.9 mmol/L    Comment: Performed at [facility], [address] [place], [place] [address]  Blood culture (routine x 2)     Status: None (Preliminary result)   Collection Time: 08/31/21  3:54 PM   Specimen: Site Not Specified; Blood  Result Value Ref Range   Specimen Description SITE NOT SPECIFIED    Special Requests      BOTTLES DRAWN  AEROBIC ONLY Blood Culture results may not be optimal due to an inadequate volume of blood received in culture bottles Performed at [facility], [address] [place], [place] [address]    Culture PENDING    Report Status PENDING   Type and screen [facility]     Status: None   Collection Time: 08/31/21  4:38 PM  Result Value Ref Range   ABO/RH(D) O POS    Antibody Screen POS    Sample Expiration      09/03/2021,2359 Performed at [facility], [address] [place], [place] [address]    DG Chest Port 1 View  Result Date: 08/31/2021 CLINICAL DATA:  Hypotension, possible sepsis EXAM: PORTABLE CHEST 1 VIEW COMPARISON:  Portable exam 1559 hours compared to 08/25/2021 FINDINGS: RIGHT jugular Port-A-Cath with tip projecting over SVC. Upper normal heart size. Mediastinal contours and pulmonary vascularity normal. Lungs clear. No pulmonary infiltrate, pleural effusion, or pneumothorax. Bones demineralized. IMPRESSION: No acute abnormalities. Electronically Signed   By: Lavonia Dana M.D.   On: 08/31/2021 16:08    Pending Labs Unresulted Labs (From admission, onward)     Start     Ordered   08/31/21 1550  Lactic acid, plasma  Now then every 2 hours,   R (with STAT occurrences)      08/31/21 1549   08/31/21 1501  Urinalysis, Routine w reflex microscopic Urine, Clean Catch  Once,   URGENT        08/31/21 1500            Vitals/Pain Today's Vitals   08/31/21 1456 08/31/21 1600 08/31/21 1630 08/31/21 1750  BP: (!) 93/59 111/67 129/67 129/76  Pulse: (!) 52 (!) 57 (!) 54 74  Resp: '18 10 13 '$ (!) 23  Temp: 97.7 F (36.5 C)     TempSrc: Oral     SpO2: 90% 90% 95% 94%  Weight:      Height:      PainSc:        Isolation Precautions No active isolations  Medications Medications  lactated ringers bolus 1,000 mL (1,000 mLs Intravenous New Bag/Given 08/31/21 1623)  LORazepam (ATIVAN) tablet 0.5 mg (0.5 mg Oral Given 08/31/21 1810)    Mobility walks with person assist Low  fall risk   Focused Assessments    R Recommendations: See Admitting Provider Note  Report given to: Harlin Rain RN  Additional Notes:  Unable to get blood cultures at this time. Needs repeat troponin and lactic acid drawn from port. Patient requested to wait until her anxiety medication kicked in.

## 2021-08-31 NOTE — Telephone Encounter (Signed)
Direct contact made, bP 80/40, recently discharged  with AKI, etc, he is aware

## 2021-08-31 NOTE — ED Provider Notes (Signed)
[facility] EMERGENCY DEPARTMENT Provider Note   CSN: [id number] Arrival date & time: 08/31/21  1435     History  Chief Complaint  Patient presents with   Hypotension    Renee Waters is a 76 y.o. female.  With past medical history of hypertension, GERD, CKD stage III, active leukemia currently on oral chemotherapy with Jakafi who presents to the emergency department with hypotension.  Patient states that she has been feeling weak and tired since she was discharged from the hospital on Saturday.  She describes feeling lethargic with anorexia and lightheadedness.  States she had BP 80/40 and was instructed to come to ED by PCP. She denies having any fevers, chest pain, shortness of breath, abdominal pain, bloody stool, dysuria.  She states that she has been taking her Jakafi daily.  She does note that she was here last week for hypotensive shock.  Prior to her discharge (she eventually had hypertension prior to discharge) she was placed on Lopressor and taken off of her hydrochlorothiazide.  She is wondering if this is the reason that she feels poorly.  HPI     Home Medications Prior to Admission medications   Medication Sig Start Date End Date Taking? Authorizing Provider  acyclovir (ZOVIRAX) 800 MG tablet Take 1 tablet (800 mg total) by mouth 5 (five) times daily. Patient taking differently: Take 800 mg by mouth 3 (three) times daily. 04/21/21   [name], MD  acyclovir ointment (ZOVIRAX) 5 % Apply 1 application. topically every 3 (three) hours. 04/21/21   [name], MD  allopurinol (ZYLOPRIM) 300 MG tablet Take 1 tablet by mouth once daily 08/30/21   Derek Jack, MD  citalopram (CELEXA) 20 MG tablet Take 1 tablet by mouth once daily 08/30/21   [name], MD  EPINEPHRINE 0.3 mg/0.3 mL IJ SOAJ injection INJECT 0.3 MLS INTO MUSCLE ONCE AS NEEDED Patient taking differently: Inject 0.3 mg into the muscle as needed for anaphylaxis. 10/04/16   [name], MD  gabapentin (NEURONTIN) 300 MG capsule Take one capsule by mouth once daily, as needed, for pain 03/30/21   [name], MD  hydrochlorothiazide (HYDRODIURIL) 25 MG tablet Take 1 tablet by mouth once daily 06/28/21   [name], MD  lidocaine-prilocaine (EMLA) cream Apply 1 application. topically as needed. 06/18/21   Derek Jack, MD  losartan (COZAAR) 25 MG tablet Take 1 tablet by mouth once daily for blood pressure 04/26/21   [name], MD  meloxicam (MOBIC) 7.5 MG tablet Take 1 tablet (7.5 mg total) by mouth daily as needed for pain (knee pain). 01/28/21   Carole Civil, MD  metoprolol tartrate (LOPRESSOR) 25 MG tablet Take 1 tablet (25 mg total) by mouth 2 (two) times daily. 08/28/21   Johnson, Clanford L, MD  mirtazapine (REMERON) 15 MG tablet Take 1 tablet (15 mg total) by mouth at bedtime. 06/07/21   [name], MD  oxyCODONE (OXY IR/ROXICODONE) 5 MG immediate release tablet Take 5 mg by mouth every 4 (four) hours as needed. 04/22/21   [provider]  pantoprazole (PROTONIX) 40 MG tablet TAKE 1 TABLET BY MOUTH ONCE DAILY BEFORE BREAKFAST 08/30/21   Sherron Monday, NP  potassium chloride (KLOR-CON) 10 MEQ tablet Take 1 tablet by mouth once daily 08/06/21   Derek Jack, MD  ruxolitinib phosphate (JAKAFI) 15 MG tablet Take 1 tablet (15 mg total) by mouth 2 (two) times daily. 05/11/21   Derek Jack, MD  temazepam (  RESTORIL) 30 MG capsule Take 1 capsule (30 mg total) by mouth at bedtime as needed for sleep. 03/09/21   [name], MD  tiZANidine (ZANAFLEX) 4 MG tablet Take 8 mg by mouth 3 (three) times daily as needed (spasms). 08/05/19   [name], MD      Allergies    Bee venom, Acetaminophen, Amlodipine, Tyloxapol, Other, and Oxycodone-acetaminophen    Review of Systems   Review of Systems  Constitutional:  Positive for appetite change and fatigue. Negative for fever.  Respiratory:   Negative for cough and shortness of breath.   Cardiovascular:  Negative for chest pain.  Gastrointestinal:  Negative for abdominal pain, blood in stool, nausea and vomiting.  Genitourinary:  Negative for dysuria.  Neurological:  Positive for light-headedness. Negative for syncope.  All other systems reviewed and are negative.   Physical Exam Updated Vital Signs BP (!) 93/59 (BP Location: Right Arm)   Pulse (!) 52   Temp 97.7 F (36.5 C) (Oral)   Resp 18   Ht '5\' 5"'$  (1.651 m)   Wt 59.7 kg   SpO2 90%   BMI 21.90 kg/m  Physical Exam Vitals and nursing note reviewed.  Constitutional:      Appearance: She is ill-appearing.     Comments: Tired appearing  HENT:     Head: Normocephalic and atraumatic.     Mouth/Throat:     Mouth: Mucous membranes are dry.     Pharynx: Oropharynx is clear.  Eyes:     General: No scleral icterus.    Extraocular Movements: Extraocular movements intact.     Pupils: Pupils are equal, round, and reactive to light.  Cardiovascular:     Rate and Rhythm: Regular rhythm. Bradycardia present.     Pulses: Normal pulses.     Heart sounds: No murmur heard. Pulmonary:     Effort: Pulmonary effort is normal. No respiratory distress.     Breath sounds: Normal breath sounds.  Abdominal:     General: Abdomen is protuberant. A surgical scar is present. Bowel sounds are normal.     Palpations: Abdomen is soft.     Tenderness: There is no abdominal tenderness.  Musculoskeletal:        General: Normal range of motion.     Cervical back: Neck supple.     Right lower leg: 1+ Edema present.     Left lower leg: 1+ Edema present.  Skin:    General: Skin is warm and dry.     Capillary Refill: Capillary refill takes less than 2 seconds.     Coloration: Skin is not jaundiced.  Neurological:     General: No focal deficit present.     Mental Status: She is alert and oriented to person, place, and time. Mental status is at baseline.  Psychiatric:        Mood and  Affect: Mood normal.        Behavior: Behavior normal.        Thought Content: Thought content normal.        Judgment: Judgment normal.    ED Results / Procedures / Treatments   Labs (all labs ordered are listed, but only abnormal results are displayed) Labs Reviewed  BASIC METABOLIC PANEL - Abnormal; Notable for the following components:      Result Value   Sodium 134 (*)    Glucose, Bld 109 (*)    BUN 41 (*)    Creatinine, Ser 2.34 (*)    Calcium  7.6 (*)    GFR, Estimated 21 (*)    All other components within normal limits  CBC - Abnormal; Notable for the following components:   WBC 100.7 (*)    RBC 2.61 (*)    Hemoglobin 7.9 (*)    HCT 24.2 (*)    RDW 21.5 (*)    Platelets 77 (*)    nRBC 2.9 (*)    All other components within normal limits  PROTIME-INR - Abnormal; Notable for the following components:   Prothrombin Time 17.3 (*)    INR 1.4 (*)    All other components within normal limits  CULTURE, BLOOD (ROUTINE X 2)  CULTURE, BLOOD (ROUTINE X 2)  PHOSPHORUS  MAGNESIUM  URIC ACID  LACTIC ACID, PLASMA  URINALYSIS, ROUTINE W REFLEX MICROSCOPIC  LACTIC ACID, PLASMA  APTT  CBG MONITORING, ED  TYPE AND SCREEN  TROPONIN I (HIGH SENSITIVITY)  TROPONIN I (HIGH SENSITIVITY)   EKG EKG Interpretation  Date/Time:  Tuesday August 31 2021 15:10:40 EDT Ventricular Rate:  51 PR Interval:  212 QRS Duration: 92 QT Interval:  480 QTC Calculation: 443 R Axis:   51 Text Interpretation: Sinus rhythm Borderline prolonged PR interval Confirmed by Fredia Sorrow [phone]) on 08/31/2021 3:36:27 PM  Radiology DG Chest Port 1 View  Result Date: 08/31/2021 CLINICAL DATA:  Hypotension, possible sepsis EXAM: PORTABLE CHEST 1 VIEW COMPARISON:  Portable exam 1559 hours compared to 08/25/2021 FINDINGS: RIGHT jugular Port-A-Cath with tip projecting over SVC. Upper normal heart size. Mediastinal contours and pulmonary vascularity normal. Lungs clear. No pulmonary infiltrate, pleural  effusion, or pneumothorax. Bones demineralized. IMPRESSION: No acute abnormalities. Electronically Signed   By: Lavonia Dana M.D.   On: 08/31/2021 16:08    Procedures Procedures   Medications Ordered in ED Medications  LORazepam (ATIVAN) tablet 0.5 mg (has no administration in time range)  lactated ringers bolus 1,000 mL (1,000 mLs Intravenous New Bag/Given 08/31/21 1623)    ED Course/ Medical Decision Making/ A&P                           Medical Decision Making Amount and/or Complexity of Data Reviewed Labs: ordered. Radiology: ordered.  Risk Prescription drug management.  This patient presents to the ED for concern of hypotension, this involves an extensive number of treatment options, and is a complaint that carries with it a high risk of complications and morbidity.  The differential diagnosis includes sepsis, medication side effect, heart failure, dehydration, etc.   Co morbidities that complicate the patient evaluation leukemia  Additional history obtained:  Additional history obtained from: husband at bedside  External records from outside source obtained and reviewed including: most recent hospitalization H/P note   EKG: EKG: normal EKG, normal sinus rhythm.   Cardiac Monitoring: The patient was maintained on a cardiac monitor.  I personally viewed and interpreted the cardiac monitored which showed an underlying rhythm of: sinus bradycardia to sinus rhythm   Lab Results: I personally ordered, reviewed, and interpreted labs. Pertinent results include: CBC white count 100.7, baseline around 98. Hgb 7.9, baseline. Platelets 77, baseline. BMP with AKI BUN 41, Cr. 2.34, and decreased GFR 43 -> 21. Electrolytes stable.  No hypercalcemia, hyperuricemia, hyperphosphatemia concerning for TLS  Lactic 0.6 Bcx pending Troponin negative  Mag wnl   Imaging Studies ordered:  I ordered imaging studies which included x-ray.  I independently reviewed & interpreted imaging & am  in agreement with radiology impression. Imaging shows: CXR  negative   Medications  I ordered medication including LR for hypotension, Ativan for anxiety Reevaluation of the patient after medication shows that patient improved -I reviewed the patient's home medications and did not make adjustments. -I did not prescribe new home medications.  Tests Considered: N/A  Critical Interventions: Fluids  Consultations: I requested consultation with the hospitalist, Zierle-Ghosh,  and discussed lab and imaging findings as well as pertinent plan - they recommend: admission   SDH None identified   ED Course:  76 year old female with notably history of active leukemia who presents to the emergency department with hypotension.  Physical exam is notable for a fatigued female in no acute distress.  She does appear tired but is in no respiratory distress.  She is slightly bradycardic and hypotensive. Given her medical history, obtained very broad and comprehensive work-up. Given a liter of IV fluids  Labs notable for leukocytosis to 100.7, this appears to be around her baseline.  Hemoglobin is 7.9, also around her baseline. BMP consistent with an AKI.  She has a BUN/creatinine of 41/2.34 respectively.  Additionally her GFR has halved from 43 to 21. Lactic is negative, reassuring that this may not be sepsis related hypotension, but obtain blood cultures anyway.  They are pending. TLS labs are negative, no hematologic emergency at this time Troponins negative, EKG without ischemia or infarction.  Doubt ACS as a component of this. She is coagulopathic, consistent with leukemia Chest x-ray without pneumonia UA pending  She was given a liter of LR here in the ED with improvement in her blood pressure.  Think that her hypotension may be multifactorial including decreased p.o. intake secondary to her active cancer.  Additionally she was started on Lopressor prior to her discharge on Saturday and has  been feeling fatigued since then.  She was bradycardic and hypotensive on arrival.  She has progressively improved since she has been here and takes Lopressor twice daily.  Wondering if it is wearing off at this time which is resulted in her improved vitals.  This may need to be altered while she is admitted.  May also be hypovolemic.  May need ongoing fluids during her admission.  Discussed with hospitalist, Zierle-Ghosh, MD who agrees to admission at this time.  Patient also agrees to admission after long conversation at bedside.  Family was also available.  I answered all of their questions.  After consideration of the diagnostic results and the patients response to treatment, I feel that the patent would benefit from admission. The patient has been appropriately medically screened and/or stabilized in the ED.   Final Clinical Impression(s) / ED Diagnoses Final diagnoses:  Hypotension, unspecified hypotension type    Rx / DC Orders ED Discharge Orders     None         Mickie Hillier, PA-C 08/31/21 Alvira Philips, MD 08/31/21 [phone]

## 2021-08-31 NOTE — ED Notes (Signed)
Lab in again to attempt blood cultures. Unsuccessful x 4 sticks from 3 different people. Patient became tearful and anxious and refuses to have another attempt done at this time.

## 2021-08-31 NOTE — ED Triage Notes (Signed)
Pt presents with symptoms of numbness and tingling in left side and hypotensive, B/P taken at home and PCP office.

## 2021-08-31 NOTE — ED Notes (Signed)
Pt care taken, started her maintenance fluid, drew her repeat labs, no other complaints at this time.

## 2021-09-01 ENCOUNTER — Observation Stay (HOSPITAL_COMMUNITY): Payer: Medicare Other

## 2021-09-01 ENCOUNTER — Inpatient Hospital Stay (HOSPITAL_COMMUNITY): Payer: Medicare Other

## 2021-09-01 DIAGNOSIS — Z8249 Family history of ischemic heart disease and other diseases of the circulatory system: Secondary | ICD-10-CM | POA: Diagnosis not present

## 2021-09-01 DIAGNOSIS — D471 Chronic myeloproliferative disease: Secondary | ICD-10-CM | POA: Diagnosis not present

## 2021-09-01 DIAGNOSIS — C946 Myelodysplastic disease, not classified: Secondary | ICD-10-CM | POA: Diagnosis present

## 2021-09-01 DIAGNOSIS — N179 Acute kidney failure, unspecified: Secondary | ICD-10-CM | POA: Diagnosis not present

## 2021-09-01 DIAGNOSIS — K219 Gastro-esophageal reflux disease without esophagitis: Secondary | ICD-10-CM | POA: Diagnosis present

## 2021-09-01 DIAGNOSIS — Z66 Do not resuscitate: Secondary | ICD-10-CM | POA: Diagnosis present

## 2021-09-01 DIAGNOSIS — M25551 Pain in right hip: Secondary | ICD-10-CM | POA: Diagnosis not present

## 2021-09-01 DIAGNOSIS — Z885 Allergy status to narcotic agent status: Secondary | ICD-10-CM | POA: Diagnosis not present

## 2021-09-01 DIAGNOSIS — K2100 Gastro-esophageal reflux disease with esophagitis, without bleeding: Secondary | ICD-10-CM | POA: Diagnosis not present

## 2021-09-01 DIAGNOSIS — F419 Anxiety disorder, unspecified: Secondary | ICD-10-CM

## 2021-09-01 DIAGNOSIS — I10 Essential (primary) hypertension: Secondary | ICD-10-CM | POA: Diagnosis not present

## 2021-09-01 DIAGNOSIS — I272 Pulmonary hypertension, unspecified: Secondary | ICD-10-CM | POA: Diagnosis not present

## 2021-09-01 DIAGNOSIS — G319 Degenerative disease of nervous system, unspecified: Secondary | ICD-10-CM | POA: Diagnosis not present

## 2021-09-01 DIAGNOSIS — J439 Emphysema, unspecified: Secondary | ICD-10-CM | POA: Diagnosis not present

## 2021-09-01 DIAGNOSIS — D696 Thrombocytopenia, unspecified: Secondary | ICD-10-CM | POA: Diagnosis present

## 2021-09-01 DIAGNOSIS — F32A Depression, unspecified: Secondary | ICD-10-CM | POA: Diagnosis present

## 2021-09-01 DIAGNOSIS — I951 Orthostatic hypotension: Secondary | ICD-10-CM

## 2021-09-01 DIAGNOSIS — S0990XA Unspecified injury of head, initial encounter: Secondary | ICD-10-CM | POA: Diagnosis not present

## 2021-09-01 DIAGNOSIS — I959 Hypotension, unspecified: Secondary | ICD-10-CM

## 2021-09-01 DIAGNOSIS — F324 Major depressive disorder, single episode, in partial remission: Secondary | ICD-10-CM

## 2021-09-01 DIAGNOSIS — J9601 Acute respiratory failure with hypoxia: Secondary | ICD-10-CM

## 2021-09-01 DIAGNOSIS — I700 Atherosclerosis of aorta: Secondary | ICD-10-CM | POA: Diagnosis not present

## 2021-09-01 DIAGNOSIS — Z79899 Other long term (current) drug therapy: Secondary | ICD-10-CM | POA: Diagnosis not present

## 2021-09-01 DIAGNOSIS — W19XXXA Unspecified fall, initial encounter: Secondary | ICD-10-CM | POA: Diagnosis not present

## 2021-09-01 DIAGNOSIS — J90 Pleural effusion, not elsewhere classified: Secondary | ICD-10-CM | POA: Diagnosis not present

## 2021-09-01 DIAGNOSIS — D630 Anemia in neoplastic disease: Secondary | ICD-10-CM | POA: Diagnosis present

## 2021-09-01 DIAGNOSIS — R6510 Systemic inflammatory response syndrome (SIRS) of non-infectious origin without acute organ dysfunction: Secondary | ICD-10-CM

## 2021-09-01 DIAGNOSIS — Z91030 Bee allergy status: Secondary | ICD-10-CM | POA: Diagnosis not present

## 2021-09-01 DIAGNOSIS — J189 Pneumonia, unspecified organism: Secondary | ICD-10-CM | POA: Diagnosis present

## 2021-09-01 DIAGNOSIS — Y95 Nosocomial condition: Secondary | ICD-10-CM | POA: Diagnosis present

## 2021-09-01 DIAGNOSIS — Z87891 Personal history of nicotine dependence: Secondary | ICD-10-CM | POA: Diagnosis not present

## 2021-09-01 DIAGNOSIS — Z96641 Presence of right artificial hip joint: Secondary | ICD-10-CM | POA: Diagnosis present

## 2021-09-01 DIAGNOSIS — Z886 Allergy status to analgesic agent status: Secondary | ICD-10-CM | POA: Diagnosis not present

## 2021-09-01 DIAGNOSIS — Y92009 Unspecified place in unspecified non-institutional (private) residence as the place of occurrence of the external cause: Secondary | ICD-10-CM | POA: Diagnosis not present

## 2021-09-01 DIAGNOSIS — I129 Hypertensive chronic kidney disease with stage 1 through stage 4 chronic kidney disease, or unspecified chronic kidney disease: Secondary | ICD-10-CM | POA: Diagnosis present

## 2021-09-01 DIAGNOSIS — M329 Systemic lupus erythematosus, unspecified: Secondary | ICD-10-CM | POA: Diagnosis present

## 2021-09-01 DIAGNOSIS — Z888 Allergy status to other drugs, medicaments and biological substances status: Secondary | ICD-10-CM | POA: Diagnosis not present

## 2021-09-01 DIAGNOSIS — Z20822 Contact with and (suspected) exposure to covid-19: Secondary | ICD-10-CM | POA: Diagnosis present

## 2021-09-01 DIAGNOSIS — N1831 Chronic kidney disease, stage 3a: Secondary | ICD-10-CM | POA: Diagnosis present

## 2021-09-01 HISTORY — DX: Acute respiratory failure with hypoxia: J96.01

## 2021-09-01 LAB — CBC WITH DIFFERENTIAL/PLATELET
Abs Immature Granulocytes: 25.1 10*3/uL — ABNORMAL HIGH (ref 0.00–0.07)
Band Neutrophils: 12 %
Basophils Absolute: 1.1 10*3/uL — ABNORMAL HIGH (ref 0.0–0.1)
Basophils Relative: 1 %
Blasts: 6 %
Eosinophils Absolute: 0 10*3/uL (ref 0.0–0.5)
Eosinophils Relative: 0 %
HCT: 23.6 % — ABNORMAL LOW (ref 36.0–46.0)
Hemoglobin: 7.7 g/dL — ABNORMAL LOW (ref 12.0–15.0)
Lymphocytes Relative: 13 %
Lymphs Abs: 14.2 10*3/uL — ABNORMAL HIGH (ref 0.7–4.0)
MCH: 30.3 pg (ref 26.0–34.0)
MCHC: 32.6 g/dL (ref 30.0–36.0)
MCV: 92.9 fL (ref 80.0–100.0)
Metamyelocytes Relative: 6 %
Monocytes Absolute: 3.3 10*3/uL — ABNORMAL HIGH (ref 0.1–1.0)
Monocytes Relative: 3 %
Myelocytes: 11 %
Neutro Abs: 58.9 10*3/uL — ABNORMAL HIGH (ref 1.7–7.7)
Neutrophils Relative %: 42 %
Platelets: 108 10*3/uL — ABNORMAL LOW (ref 150–400)
Promyelocytes Relative: 6 %
RBC: 2.54 MIL/uL — ABNORMAL LOW (ref 3.87–5.11)
RDW: 21.7 % — ABNORMAL HIGH (ref 11.5–15.5)
WBC: 109 10*3/uL (ref 4.0–10.5)
nRBC: 3.5 % — ABNORMAL HIGH (ref 0.0–0.2)
nRBC: 5 /100 WBC — ABNORMAL HIGH

## 2021-09-01 LAB — COMPREHENSIVE METABOLIC PANEL
ALT: 18 U/L (ref 0–44)
AST: 37 U/L (ref 15–41)
Albumin: 3.1 g/dL — ABNORMAL LOW (ref 3.5–5.0)
Alkaline Phosphatase: 143 U/L — ABNORMAL HIGH (ref 38–126)
Anion gap: 5 (ref 5–15)
BUN: 36 mg/dL — ABNORMAL HIGH (ref 8–23)
CO2: 26 mmol/L (ref 22–32)
Calcium: 7.9 mg/dL — ABNORMAL LOW (ref 8.9–10.3)
Chloride: 111 mmol/L (ref 98–111)
Creatinine, Ser: 1.7 mg/dL — ABNORMAL HIGH (ref 0.44–1.00)
GFR, Estimated: 31 mL/min — ABNORMAL LOW (ref >60)
Glucose, Bld: 92 mg/dL (ref 70–99)
Potassium: 3.7 mmol/L (ref 3.5–5.1)
Sodium: 142 mmol/L (ref 135–145)
Total Bilirubin: 0.9 mg/dL (ref 0.3–1.2)
Total Protein: 6.3 g/dL — ABNORMAL LOW (ref 6.5–8.1)

## 2021-09-01 LAB — MAGNESIUM: Magnesium: 1.7 mg/dL (ref 1.7–2.4)

## 2021-09-01 LAB — TSH: TSH: 1.158 u[IU]/mL (ref 0.350–4.500)

## 2021-09-01 LAB — MRSA NEXT GEN BY PCR, NASAL: MRSA by PCR Next Gen: NOT DETECTED

## 2021-09-01 LAB — SARS CORONAVIRUS 2 BY RT PCR: SARS Coronavirus 2 by RT PCR: NEGATIVE

## 2021-09-01 MED ORDER — VANCOMYCIN VARIABLE DOSE PER UNSTABLE RENAL FUNCTION (PHARMACIST DOSING): Status: DC

## 2021-09-01 MED ORDER — CEFEPIME HCL 2 G IV SOLR
2.0000 g | INTRAVENOUS | Status: DC
  Administered 2021-09-01: 2 g via INTRAVENOUS
  Filled 2021-09-01: qty 12.5

## 2021-09-01 MED ORDER — VANCOMYCIN HCL 1250 MG/250ML IV SOLN
1250.0000 mg | Freq: Once | INTRAVENOUS | Status: AC
  Administered 2021-09-01: 1250 mg via INTRAVENOUS
  Filled 2021-09-01: qty 250

## 2021-09-01 MED ORDER — DIPHENHYDRAMINE HCL 25 MG PO CAPS
25.0000 mg | ORAL_CAPSULE | Freq: Four times a day (QID) | ORAL | Status: DC | PRN
  Administered 2021-09-01: 25 mg via ORAL
  Filled 2021-09-01: qty 1

## 2021-09-01 MED ORDER — ALLOPURINOL 300 MG PO TABS
300.0000 mg | ORAL_TABLET | Freq: Every day | ORAL | Status: DC
  Administered 2021-09-01 – 2021-09-03 (×3): 300 mg via ORAL
  Filled 2021-09-01 (×3): qty 1

## 2021-09-01 MED ORDER — LORAZEPAM 0.5 MG PO TABS: 0.5000 mg | ORAL_TABLET | Freq: Four times a day (QID) | ORAL | Status: DC | PRN

## 2021-09-01 MED ORDER — CEFEPIME HCL 2 G IV SOLR
2.0000 g | Freq: Once | INTRAVENOUS | Status: AC
Start: 2021-09-01 — End: 2021-09-01
  Administered 2021-09-01: 2 g via INTRAVENOUS
  Filled 2021-09-01: qty 12.5

## 2021-09-01 MED ORDER — DIPHENHYDRAMINE HCL 50 MG/ML IJ SOLN
25.0000 mg | Freq: Once | INTRAMUSCULAR | Status: AC
  Administered 2021-09-01: 25 mg via INTRAVENOUS
  Filled 2021-09-01: qty 1

## 2021-09-01 MED ORDER — METOPROLOL TARTRATE 25 MG PO TABS
25.0000 mg | ORAL_TABLET | Freq: Two times a day (BID) | ORAL | Status: DC
  Administered 2021-09-01 – 2021-09-03 (×5): 25 mg via ORAL
  Filled 2021-09-01 (×5): qty 1

## 2021-09-01 MED ORDER — VANCOMYCIN HCL IN DEXTROSE 1-5 GM/200ML-% IV SOLN
1000.0000 mg | Freq: Once | INTRAVENOUS | Status: DC
  Filled 2021-09-01: qty 200

## 2021-09-01 MED ORDER — ACETAMINOPHEN 650 MG RE SUPP: 650.0000 mg | Freq: Four times a day (QID) | RECTAL | Status: DC | PRN

## 2021-09-01 MED ORDER — GUAIFENESIN-DM 100-10 MG/5ML PO SYRP
5.0000 mL | ORAL_SOLUTION | ORAL | Status: DC | PRN
  Administered 2021-09-01: 5 mL via ORAL
  Filled 2021-09-01: qty 5

## 2021-09-01 MED ORDER — HYDRALAZINE HCL 20 MG/ML IJ SOLN
10.0000 mg | INTRAMUSCULAR | Status: DC | PRN
  Administered 2021-09-01: 10 mg via INTRAVENOUS
  Filled 2021-09-01: qty 1

## 2021-09-01 MED ORDER — OXYCODONE HCL 5 MG PO TABS
5.0000 mg | ORAL_TABLET | ORAL | Status: DC | PRN
  Administered 2021-09-02 – 2021-09-03 (×2): 5 mg via ORAL
  Filled 2021-09-01 (×2): qty 1

## 2021-09-01 MED ORDER — ACETAMINOPHEN 325 MG PO TABS
650.0000 mg | ORAL_TABLET | Freq: Four times a day (QID) | ORAL | Status: DC | PRN
  Administered 2021-09-01 (×2): 650 mg via ORAL
  Filled 2021-09-01 (×2): qty 2

## 2021-09-01 MED ORDER — LOSARTAN POTASSIUM 50 MG PO TABS
25.0000 mg | ORAL_TABLET | Freq: Every day | ORAL | Status: DC
  Administered 2021-09-01 – 2021-09-03 (×3): 25 mg via ORAL
  Filled 2021-09-01 (×3): qty 1

## 2021-09-01 MED ORDER — CHLORHEXIDINE GLUCONATE CLOTH 2 % EX PADS
6.0000 | MEDICATED_PAD | Freq: Every day | CUTANEOUS | Status: DC
Start: 2021-09-01 — End: 2021-09-03
  Administered 2021-09-01 – 2021-09-03 (×3): 6 via TOPICAL

## 2021-09-01 MED ORDER — IPRATROPIUM-ALBUTEROL 0.5-2.5 (3) MG/3ML IN SOLN
3.0000 mL | RESPIRATORY_TRACT | Status: DC | PRN
  Administered 2021-09-01: 3 mL via RESPIRATORY_TRACT
  Filled 2021-09-01: qty 3

## 2021-09-01 NOTE — Assessment & Plan Note (Signed)
-   Continue Celexa, Remeron -Continue Restoril for associated insomnia

## 2021-09-01 NOTE — Progress Notes (Signed)
Renee Waters is a 76 y.o. female with medical history significant of acute cholangitis, allergies, anemia, Barrett's esophagus, CKD, GERD,, hypertension, history of blood transfusion with 2 units at the last admission, systemic lupus, myeloproliferative neoplasm and more presents the ED with a chief complaint of hypotension.  Her blood pressures have stabilized with the use of IV fluid and she was noted to have some poor p.o. intake and was admitted for AKI.  She was also thought to have some pneumonia and was started on vancomycin and cefepime which has now been transitioned to cefepime only.  This is a recurrent hospitalization for her and she would like to see her oncologist to see if her medications could be contributing to her recurrence of hospitalization.  Oncology consulted.  Continue current antibiotics and wean oxygen.  Follow labs in AM.  Hold further IV fluid.  Blood pressures are now elevated and hydralazine ordered as needed.  Patient seen and evaluated after midnight and assessed at bedside.  Total care time: 30 minutes.

## 2021-09-01 NOTE — Assessment & Plan Note (Signed)
Requiring 2-3 L nasal cannula Suspicion for pneumonia See treatment plan for SIRS

## 2021-09-01 NOTE — Assessment & Plan Note (Signed)
-   With anemia, thrombocytopenia, leukocytosis -Follows with [name] -Recently 2 units blood transfusion at last admission, hemoglobin was up to 9 and down to 7.9 today -Does report dizziness, no shortness of breath but is requiring oxygen to maintain oxygen saturations -Hemoccult with next bowel movement -Consult [name] -Holding ruxolitinib while treating suspected infection

## 2021-09-01 NOTE — Assessment & Plan Note (Signed)
-   Holding losartan and metoprolol in the setting of hypotension

## 2021-09-01 NOTE — TOC Initial Note (Addendum)
Transition of Care [facility]) - Initial/Assessment Note    Patient Details  Name: Renee Waters MRN: [id number] Date of Birth: September 04, 1945  [facility] CM/SW Contact:    Iona Beard, [place] Phone Number: 09/01/2021, 11:44 AM  Clinical Narrative:                 [facility] consulted for HH/DME needs. CSW spoke with pt and daughter in room to complete assessment. Pt lives with her spouse. Pt needs assistance in completing ADLs. Pt does not drive. CSW explained that if they need transportation for pt to cal Medicare number and see if pt has any transportation benefits. Family understanding. Pt has not had HH services but family is interested. CSW explained difference between [facility] and private paid caregiver services. CSW explained some resources for caregiver services as well as the hospital can only set up [facility]. Pt has a cane, walker and BSC in the home.   CSW inquired about interest in SNF if PT works with pt and recommends. Pts daughter states this is something they would not be interested in. They would want pt to return home with [facility] services. TOC to set up [facility] prior to pts D/C from hospital. TOC to follow.  CSW spoke with [facility] HH rep Marjory Lies who states they can accept pt for [facility] PT/RN/OT/SW services. Pt will need HH orders prior to D/C.  TOC to follow.   Expected Discharge Plan: [place] Barriers to Discharge: Continued Medical Work up   Patient Goals and CMS Choice Patient states their goals for this hospitalization and ongoing recovery are:: return home with [facility] CMS Medicare.gov Compare Post Acute Care list provided to:: Patient Choice offered to / list presented to : Patient  Expected Discharge Plan and Services Expected Discharge Plan: [place] In-house Referral: Clinical Social Work Discharge Planning Services: CM Consult Post Acute Care Choice: [place] arrangements for the past 2 months: Single Family Home                                       Prior Living Arrangements/Services Living arrangements for the past 2 months: Single Family Home Lives with:: Spouse Patient language and need for interpreter reviewed:: Yes Do you feel safe going back to the place where you live?: Yes      Need for Family Participation in Patient Care: Yes (Comment) Care giver support system in place?: Yes (comment) Current home services: DME Criminal Activity/Legal Involvement Pertinent to Current Situation/Hospitalization: No - Comment as needed  Activities of Daily Living Home Assistive Devices/Equipment: Environmental consultant (specify type), Cane (specify quad or straight) ADL Screening (condition at time of admission) Patient's cognitive ability adequate to safely complete daily activities?: Yes Is the patient deaf or have difficulty hearing?: No Does the patient have difficulty seeing, even when wearing glasses/contacts?: No Does the patient have difficulty concentrating, remembering, or making decisions?: No Patient able to express need for assistance with ADLs?: Yes Does the patient have difficulty dressing or bathing?: No Independently performs ADLs?: Yes (appropriate for developmental age) Does the patient have difficulty walking or climbing stairs?: Yes Weakness of Legs: Both Weakness of Arms/Hands: None  Permission Sought/Granted                  Emotional Assessment Appearance:: Appears stated age Attitude/Demeanor/Rapport: Engaged Affect (typically observed): Accepting   Alcohol / Substance Use:  Not Applicable Psych Involvement: No (comment)  Admission diagnosis:  AKI (acute kidney injury) ([place]) [N17.9] Hypotension, unspecified hypotension type [I95.9] Patient Active Problem List   Diagnosis Date Noted   SIRS (systemic inflammatory response syndrome) ([place]) 09/01/2021   Acute respiratory failure with hypoxia ([place]) 09/01/2021   Fall at home, initial encounter 09/01/2021   Hypomagnesemia 08/27/2021    Symptomatic anemia 08/26/2021   AKI (acute kidney injury) ([place]) 08/25/2021   MPN (myeloproliferative neoplasm) ([place]) 04/21/2021   Anxiety 04/05/2021   Leukocytosis 11/09/2020   CKD (chronic kidney disease) stage 3, GFR 30-59 ml/min ([place]) 10/29/2020   Dermatomycosis 08/02/2020   HSV-2 (herpes simplex virus 2) infection 08/02/2020   Rib pain on left side 03/21/2020   Itching 03/21/2020   Mildly underweight adult 09/19/2019   MDD (major depressive disorder), recurrent episode, moderate ([place]) 10/28/2016   Thoracic spine pain 08/31/2016   Anemia, normocytic normochromic 05/27/2015   IDA (iron deficiency anemia) 03/02/2015   Unsteady gait 02/25/2015   Mucosal abnormality of esophagus    Systemic lupus ([place]) 10/31/2012   Vitamin D deficiency 03/16/2012   Generalized pain 03/15/2012   Constipation 10/06/2011   Hip pain 06/29/2011   Barrett's esophagus 12/13/2010   Western blot positive HSV2 06/22/2007   Depression 06/22/2007   Essential hypertension 06/22/2007   GERD (gastroesophageal reflux disease) 06/22/2007   DJD (degenerative joint disease) 06/22/2007   NECK PAIN, CHRONIC 06/22/2007   Chronic midline low back pain with sciatica 06/22/2007   Insomnia secondary to depression with anxiety 06/22/2007   Viral infection, unspecified 06/22/2007   PCP:  [name], MD Pharmacy:   [facility] [address] [place] - [address] [place] [address] Phone: [phone] Fax: [phone]     Social Determinants of Health (SDOH) Interventions    Readmission Risk Interventions     No data to display

## 2021-09-01 NOTE — Assessment & Plan Note (Signed)
Continue PPI ?

## 2021-09-01 NOTE — Progress Notes (Signed)
Pharmacy Antibiotic Note  Renee Waters is a 76 y.o. female admitted on 08/31/2021 with pneumonia.  Pharmacy has been consulted for HCAP antibiotic dosing. Patient with AKI, baseline sCr ~ 1.3, current sCr 1.7.   Plan: Vancomycin 1250 mg IV x 1 dose; Will monitor serum creatinine and vanc levels for further dosing Cefepime 2 g IV every 24 hours   Height: '5\' 5"'$  (165.1 cm) Weight: 60.5 kg (133 lb 6.1 oz) IBW/kg (Calculated) : 57  Temp (24hrs), Avg:98.7 F (37.1 C), Min:97.7 F (36.5 C), Max:100.3 F (37.9 C)  Recent Labs  Lab 08/25/21 1204 08/25/21 1233 08/26/21 0412 08/27/21 0502 08/31/21 1533 08/31/21 1550 08/31/21 1950  WBC 105.4*  --  100.3*  --  100.7*  --   --   CREATININE  --  4.48* 2.43* 1.30* 2.34*  --   --   LATICACIDVEN  --  1.4  --   --   --  0.6 0.6    Estimated Creatinine Clearance: 18.7 mL/min (A) (by C-G formula based on SCr of 2.34 mg/dL (H)).    Allergies  Allergen Reactions   Bee Venom Swelling and Hives   Acetaminophen     itches   Amlodipine     Hair loss   Tyloxapol    Other Itching, Rash and Swelling   Oxycodone-Acetaminophen Rash    Pt states, "this gives her a rash, but at home she takes oxycodone for pain relief" Pt states, "this gives her a rash, but at home she takes oxycodone for pain relief"     Thank you for allowing pharmacy to be a part of this patient's care.  Si Raider, PharmD Candidate 09/01/2021 3:25 AM

## 2021-09-01 NOTE — Assessment & Plan Note (Signed)
-   Fall of unknown mechanism 3-4 days ago -Patient reports loss of consciousness -She continues to have left hip pain -Without contrast, x-ray left hip -PT eval and treat -Monitor on telemetry

## 2021-09-01 NOTE — Assessment & Plan Note (Addendum)
-   Creatinine increased from 1.2>> 2.3 -Reports poor p.o. intake -Patient hypotensive at 80/40 prior to arrival -Patient received fluid bolus in the ED -Continue IV fluids through the night -Hold nephrotoxic agents when possible -Continue to monitor

## 2021-09-01 NOTE — Assessment & Plan Note (Addendum)
-   Continue Ativan as needed -Continue Celexa

## 2021-09-01 NOTE — H&P (Signed)
History and Physical    Patient: Renee Waters ZOX:[id number] DOB: [birth year] DOA: 08/31/2021 DOS: the patient was seen and examined on 09/01/2021 PCP: [name], MD  Patient coming from: Home  Chief Complaint:  Chief Complaint  Patient presents with   Hypotension   HPI: Renee Waters is a 76 y.o. female with medical history significant of acute cholangitis, allergies, anemia, Barrett's esophagus, CKD, GERD,, hypertension, history of blood transfusion with 2 units at the last admission, systemic lupus, myeloproliferative neoplasm and more presents the ED with a chief complaint of hypotension.  Patient reports she was doing a routine check of her blood pressure at home when she noted it was 66/40.  She called Dr. Tomie China office who advised her to go to her PCP.  At her PCP her blood pressure was 80/40 and they advised her to come into the ER.  Patient reports that she checks her blood pressure every morning, but she was not feeling her normal self.  She was dizzy which is described as feeling off balance.  She denies chest pain, dyspnea, syncope.  Patient reports that she had been dizzy the night before when she went to bed.  She has had no appetite.  Her last normal meal was half a plate of food yesterday.  She drinks about 48 ounces of water per day.  She has not noted any change in in the frequency, amount, or color of her urine output.  She has had no hematuria, no hematochezia, melena, bruises, epistaxis, or other bleeding.  Patient reports she did feel feverish after arrival in the hospital today.  Her temp was 100.3.  She does have an allergy and Tylenol which she describes as itching.  She is here for an AKI so she can have Toradol or ibuprofen.  Patient also reports that she had a fall on Saturday.  She is not sure what happened she she just ended up on the floor.  She reports she did hit her head.  She did have loss of consciousness.  She says that her husband reported to her  she was passed out for approximately 1 hour.  She has continued left hip pain.  No other complaints following that fall.  Patient does not wear oxygen at home.  Patient does not smoke, does not drink, does not use illicit drugs.  She is vaccinated for COVID.  Patient is DNR. Review of Systems: As mentioned in the history of present illness. All other systems reviewed and are negative. Past Medical History:  Diagnosis Date   Acute cholangitis    Allergy    Anemia    Anxiety    Arthritis    Phreesia 03/18/2020   Barrett's esophagus    Cataract    Chronic back pain    Chronic neck pain    CKD (chronic kidney disease) stage 3, GFR 30-59 ml/min ([place]) 10/29/2020   Depression    Genital herpes    GERD (gastroesophageal reflux disease)    H/O degenerative disc disease    History of blood transfusion    Hypertension    Insomnia    Lupus (systemic lupus erythematosus) ([place])    Neuromuscular disorder ([place])    Osteoarthritis    S/P colonoscopy June 2005   normal, no polyps   S/P endoscopy June 2005, Oct 2009   2005: short-segment Barrett's, 2009: short-segment Barrett's   Upper respiratory tract infection 07/09/2020   UTI (lower urinary tract infection) 11/2012   Past Surgical History:  Procedure Laterality  Date   ABDOMINAL HYSTERECTOMY     BACK SURGERY     BIOPSY N/A 03/20/2014   Procedure: BIOPSY;  Surgeon: Daneil Dolin, MD;  Location: [facility];  Service: Endoscopy;  Laterality: N/A;   BIOPSY  09/14/2015   Procedure: BIOPSY;  Surgeon: Daneil Dolin, MD;  Location: [facility];  Service: Endoscopy;;  esophageal and gastric   BIOPSY  07/23/2019   Procedure: BIOPSY;  Surgeon: Daneil Dolin, MD;  Location: [facility];  Service: Endoscopy;;  esophageal    CARPAL TUNNEL RELEASE Left 2013   cervical disectomy  2002   CESAREAN SECTION N/A    Phreesia 03/18/2020   CHOLECYSTECTOMY     with lysis of adhesions for sbo; "ruptured gallbladder".   COLONOSCOPY  11/09/2011   RMR: Melanosis  coli   COLONOSCOPY WITH PROPOFOL N/A 09/14/2015   Dr. Gala Romney: diverticulosis, 19m TA removed. next TCS 09/2020.    COLONOSCOPY WITH PROPOFOL N/A 04/30/2020   Procedure: COLONOSCOPY WITH PROPOFOL;  Surgeon: RDaneil Dolin MD;  Location: [facility];  Service: Endoscopy;  Laterality: N/A;  PM (ASA 3)   DENTAL SURGERY  11/2015   multiple tooth extraction   ESOPHAGOGASTRODUODENOSCOPY  11/29/2007   salmon-colored  tongue   longest stable at  3 cm, distal esophagus as described previously status post biopsy/ Hiatal hernia, otherwise normal stomach D1 and D2   ESOPHAGOGASTRODUODENOSCOPY  01/06/11   short segment Barrett's esophagus s/p bx/Hiatal hernia   ESOPHAGOGASTRODUODENOSCOPY (EGD) WITH PROPOFOL N/A 03/20/2014   REUM:PNTIRWERdistal esophagus short segment barrett's, bx with no dysplasia. next egd in 03/2017   ESOPHAGOGASTRODUODENOSCOPY (EGD) WITH PROPOFOL N/A 09/14/2015   Dr. RGala Romney Barrett's without dysplasia, gastritis benign bx, hiatal hernia. next EGD 09/2018.   ESOPHAGOGASTRODUODENOSCOPY (EGD) WITH PROPOFOL N/A 07/23/2019   Procedure: ESOPHAGOGASTRODUODENOSCOPY (EGD) WITH PROPOFOL;  Surgeon: RDaneil Dolin MD;  [facility];  Service: Endoscopy;  Laterality: N/A;  3:00pm   EYE SURGERY N/A    Phreesia 03/18/2020   HERNIA REPAIR Right 07/2010   Dr. BZada Girt  IR IMAGING GUIDED PORT INSERTION  02/09/2021   JOINT REPLACEMENT     [facility]  POLYPECTOMY  09/14/2015   Procedure: POLYPECTOMY;  Surgeon: RDaneil Dolin MD;  Location: [facility];  Service: Endoscopy;;  ascending colon   right hip replacement  07/2010   went back in sept 2012 to fix   SHOULDER ARTHROSCOPY  2008   left   SPINE SURGERY N/A    Phreesia 03/18/2020   TOTAL HIP REVISION Right 12/17/2012   Procedure: RIGHT TOTAL HIP REVISION;  Surgeon: MMauri Pole MD;  Location: [facility];  Service: Orthopedics;  Laterality: Right;   WRIST SURGERY Right 2011   open reduction right  wrist.   Social History:  reports that she quit smoking about 18 years ago. Her smoking use included cigarettes. She has a 6.25 pack-year smoking history. She has never used smokeless tobacco. She reports that she does not drink alcohol and does not use drugs.  Allergies  Allergen Reactions   Bee Venom Swelling and Hives   Acetaminophen     itches   Amlodipine     Hair loss   Tyloxapol    Other Itching, Rash and Swelling   Oxycodone-Acetaminophen Rash    Pt states, "this gives her a rash, but at home she takes oxycodone for pain relief" Pt states, "this gives her a rash, but at home  she takes oxycodone for pain relief"    Family History  Problem Relation Age of Onset   Hypertension Mother    Stroke Mother    Colon cancer Neg Hx    Anesthesia problems Neg Hx    Hypotension Neg Hx    Malignant hyperthermia Neg Hx    Pseudochol deficiency Neg Hx    Gastric cancer Neg Hx    Esophageal cancer Neg Hx     Prior to Admission medications   Medication Sig Start Date End Date Taking? Authorizing Provider  acyclovir (ZOVIRAX) 800 MG tablet Take 1 tablet (800 mg total) by mouth 5 (five) times daily. Patient taking differently: Take 400 mg by mouth 3 (three) times daily. 04/21/21  Yes [name], MD  acyclovir ointment (ZOVIRAX) 5 % Apply 1 application. topically every 3 (three) hours. 04/21/21  Yes [name], MD  allopurinol (ZYLOPRIM) 300 MG tablet Take 1 tablet by mouth once daily 08/30/21  Yes Derek Jack, MD  citalopram (CELEXA) 20 MG tablet Take 1 tablet by mouth once daily 08/30/21  Yes [name], MD  EPINEPHRINE 0.3 mg/0.3 mL IJ SOAJ injection INJECT 0.3 MLS INTO MUSCLE ONCE AS NEEDED Patient taking differently: Inject 0.3 mg into the muscle as needed for anaphylaxis. 10/04/16  Yes [name], MD  gabapentin (NEURONTIN) 300 MG capsule Take one capsule by mouth once daily, as needed, for pain 03/30/21  Yes [name], MD   lidocaine-prilocaine (EMLA) cream Apply 1 application. topically as needed. 06/18/21  Yes Derek Jack, MD  losartan (COZAAR) 25 MG tablet Take 1 tablet by mouth once daily for blood pressure 04/26/21  Yes [name], MD  meloxicam (MOBIC) 7.5 MG tablet Take 1 tablet (7.5 mg total) by mouth daily as needed for pain (knee pain). 01/28/21  Yes Carole Civil, MD  metoprolol tartrate (LOPRESSOR) 25 MG tablet Take 1 tablet (25 mg total) by mouth 2 (two) times daily. 08/28/21  Yes Johnson, Clanford L, MD  mirtazapine (REMERON) 15 MG tablet Take 1 tablet (15 mg total) by mouth at bedtime. 06/07/21  Yes [name], MD  oxyCODONE (OXY IR/ROXICODONE) 5 MG immediate release tablet Take 5 mg by mouth every 4 (four) hours as needed. 04/22/21  Yes [provider]  pantoprazole (PROTONIX) 40 MG tablet TAKE 1 TABLET BY MOUTH ONCE DAILY BEFORE BREAKFAST 08/30/21  Yes Mahon, Lenise Arena, NP  potassium chloride (KLOR-CON) 10 MEQ tablet Take 1 tablet by mouth once daily 08/06/21  Yes Derek Jack, MD  ruxolitinib phosphate (JAKAFI) 15 MG tablet Take 1 tablet (15 mg total) by mouth 2 (two) times daily. 05/11/21  Yes Derek Jack, MD  temazepam (RESTORIL) 30 MG capsule Take 1 capsule (30 mg total) by mouth at bedtime as needed for sleep. 03/09/21  Yes [name], MD  tiZANidine (ZANAFLEX) 4 MG tablet Take 8 mg by mouth 3 (three) times daily as needed (spasms). 08/05/19   [name], MD    Physical Exam: Vitals:   08/31/21 2300 09/01/21 0000 09/01/21 0100 09/01/21 0120  BP: (!) 167/70 (!) 146/55 (!) 169/80   Pulse: 86 92 89   Resp: '20 11 15   '$ Temp:   100.3 F (37.9 C)   TempSrc:   Oral   SpO2: 91% 91% 92% 92%  Weight:      Height:       1.  General: Patient lying supine in bed,  no acute distress   2. Psychiatric:  Alert and oriented x 3, flat affect and behavior normal for situation, pleasant and cooperative with exam   3. Neurologic: Speech  and language are normal, face is symmetric, moves all 4 extremities voluntarily, at baseline without acute deficits on limited exam   4. HEENMT:  Head is atraumatic, normocephalic, pupils reactive to light, neck is supple, trachea is midline, mucous membranes are moist   5. Respiratory : Lungs are clear to auscultation bilaterally without wheezing, rhonchi, rales, no cyanosis, no increase in work of breathing or accessory muscle use, right chest port   6. Cardiovascular : Heart rate normal, rhythm is regular, no murmurs, rubs or gallops, no peripheral edema, peripheral pulses palpated   7. Gastrointestinal:  Abdomen is soft, minimally distended, nontender to palpation bowel sounds active, no masses or organomegaly palpated   8. Skin:  Skin is warm, dry and intact without rashes, acute lesions, or ulcers on limited exam   9.Musculoskeletal:  No acute deformities or trauma, no asymmetry in tone, no peripheral edema, peripheral pulses palpated, no tenderness to palpation in the extremities  Data Reviewed: In the ED Temp 97.7, heart rate 52-74, respiratory rate 10-23, blood pressure 93/59-129/76 Leukocytosis 100.7, hemoglobin down to 7.9 from 9.0, platelets 77 Chemistry reveals a BUN of 41, creatinine 2.34, platelets 109 Blood culture pending Chest x-ray shows no acute abnormality Patient was given fluid bolus in the ED, and admission was requested for AKI Assessment and Plan: * AKI (acute kidney injury) ([place]) - Creatinine increased from 1.2>> 2.3 -Reports poor p.o. intake -Patient hypotensive at 80/40 prior to arrival -Patient received fluid bolus in the ED -Continue IV fluids through the night -Hold nephrotoxic agents when possible -Continue to monitor  Fall at home, initial encounter - Fall of unknown mechanism 3-4 days ago -Patient reports loss of consciousness -She continues to have left hip pain -Without contrast, x-ray left hip -PT eval and treat -Monitor on  telemetry  Acute respiratory failure with hypoxia ([facility]) Requiring 2-3 L nasal cannula Suspicion for pneumonia See treatment plan for SIRS  SIRS (systemic inflammatory response syndrome) ([facility]) - Respiratory rate 23, leukocytosis 100.7 with history of leukemia -Patient also has endorgan damage with AKI and acute respiratory failure with hypoxia requiring 2 L nasal cannula -Chest x-ray initially showed no acute abnormalities -Patient does have a cough, and hypoxia so we will repeat chest x-ray in the a.m. after adequate fluid hydration -Procalcitonin +0.74, starting HCAP antibiotics given patient's recent discharge from the hospital -Blood cultures pending -Expectorated sputum culture, urine antigens -Continue to monitor  MPN (myeloproliferative neoplasm) ([facility]) - With anemia, thrombocytopenia, leukocytosis -Follows with [name] -Recently 2 units blood transfusion at last admission, hemoglobin was up to 9 and down to 7.9 today -Does report dizziness, no shortness of breath but is requiring oxygen to maintain oxygen saturations -Hemoccult with next bowel movement -Consult [name] -Holding ruxolitinib while treating suspected infection    Anxiety - Continue Ativan as needed -Continue Celexa  GERD (gastroesophageal reflux disease) Continue PPI  Essential hypertension - Holding losartan and metoprolol in the setting of hypotension  Depression - Continue Celexa, Remeron -Continue Restoril for associated insomnia      Advance Care Planning:   Code Status: DNR   Consults: Oncology  Family Communication: Daughter at bedside  Severity of Illness: The appropriate patient status for this patient is OBSERVATION. Observation status is judged to be reasonable and necessary in order to provide the required intensity of service to ensure the patient's safety. The patient's  presenting symptoms, physical exam findings, and initial radiographic and laboratory data in the  context of their medical condition is felt to place them at decreased risk for further clinical deterioration. Furthermore, it is anticipated that the patient will be medically stable for discharge from the hospital within 2 midnights of admission.   Author: Rolla Plate, DO 09/01/2021 2:34 AM  For on call review [email or web address].

## 2021-09-01 NOTE — Consult Note (Signed)
[facility] Consultation Oncology  Name: Renee Waters      MRN: [id number]    Location: Z025/E527-78  Date: 09/01/2021 Time:6:24 PM   REFERRING PHYSICIAN: Dr. Manuella Ghazi  REASON FOR CONSULT: Myeloproliferative neoplasm   DIAGNOSIS: Pneumonia in the immunocompromise setting  HISTORY OF PRESENT ILLNESS: [name] is a 76 year old very pleasant female known to me from office visits.  She came to the ER with hypotension.  Blood pressure was 80/40.  She also reported a fall on Saturday.  She had low-grade temperatures of 100.3.  Chest x-ray was unrevealing.  Blood cultures were done.  She was started on broad-spectrum antibiotics.  She is currently receiving cefepime.  She reports worsening cough, mostly dry.  During most recent previous admission, HCTZ was held and losartan and metoprolol were continued at home for high blood pressure.  During this admission, there were initially held.  Today her blood pressure was reportedly high and she was started back on losartan.  PAST MEDICAL HISTORY:   Past Medical History:  Diagnosis Date   Acute cholangitis    Allergy    Anemia    Anxiety    Arthritis    Phreesia 03/18/2020   Barrett's esophagus    Cataract    Chronic back pain    Chronic neck pain    CKD (chronic kidney disease) stage 3, GFR 30-59 ml/min ([place]) 10/29/2020   Depression    Genital herpes    GERD (gastroesophageal reflux disease)    H/O degenerative disc disease    History of blood transfusion    Hypertension    Insomnia    Lupus (systemic lupus erythematosus) ([place])    Neuromuscular disorder ([place])    Osteoarthritis    S/P colonoscopy June 2005   normal, no polyps   S/P endoscopy June 2005, Oct 2009   2005: short-segment Barrett's, 2009: short-segment Barrett's   Upper respiratory tract infection 07/09/2020   UTI (lower urinary tract infection) 11/2012    ALLERGIES: Allergies  Allergen Reactions   Bee Venom Swelling and Hives   Acetaminophen     itches   Amlodipine      Hair loss   Tyloxapol    Other Itching, Rash and Swelling   Oxycodone-Acetaminophen Rash    Pt states, "this gives her a rash, but at home she takes oxycodone for pain relief" Pt states, "this gives her a rash, but at home she takes oxycodone for pain relief"      MEDICATIONS: I have reviewed the patient's current medications.     PAST SURGICAL HISTORY Past Surgical History:  Procedure Laterality Date   ABDOMINAL HYSTERECTOMY     BACK SURGERY     BIOPSY N/A 03/20/2014   Procedure: BIOPSY;  Surgeon: Daneil Dolin, MD;  Location: [facility];  Service: Endoscopy;  Laterality: N/A;   BIOPSY  09/14/2015   Procedure: BIOPSY;  Surgeon: Daneil Dolin, MD;  Location: [facility];  Service: Endoscopy;;  esophageal and gastric   BIOPSY  07/23/2019   Procedure: BIOPSY;  Surgeon: Daneil Dolin, MD;  Location: [facility];  Service: Endoscopy;;  esophageal    CARPAL TUNNEL RELEASE Left 2013   cervical disectomy  2002   CESAREAN SECTION N/A    Phreesia 03/18/2020   CHOLECYSTECTOMY     with lysis of adhesions for sbo; "ruptured gallbladder".   COLONOSCOPY  11/09/2011   RMR: Melanosis coli   COLONOSCOPY WITH PROPOFOL N/A 09/14/2015   Dr. Gala Romney: diverticulosis, 67m TA removed. next  TCS 09/2020.    COLONOSCOPY WITH PROPOFOL N/A 04/30/2020   Procedure: COLONOSCOPY WITH PROPOFOL;  Surgeon: Daneil Dolin, MD;  Location: [facility];  Service: Endoscopy;  Laterality: N/A;  PM (ASA 3)   DENTAL SURGERY  11/2015   multiple tooth extraction   ESOPHAGOGASTRODUODENOSCOPY  11/29/2007   salmon-colored  tongue   longest stable at  3 cm, distal esophagus as described previously status post biopsy/ Hiatal hernia, otherwise normal stomach D1 and D2   ESOPHAGOGASTRODUODENOSCOPY  01/06/11   short segment Barrett's esophagus s/p bx/Hiatal hernia   ESOPHAGOGASTRODUODENOSCOPY (EGD) WITH PROPOFOL N/A 03/20/2014   TZG:YFVCBSWH distal esophagus short segment barrett's, bx with no dysplasia. next egd in 03/2017    ESOPHAGOGASTRODUODENOSCOPY (EGD) WITH PROPOFOL N/A 09/14/2015   Dr. Gala Romney: Barrett's without dysplasia, gastritis benign bx, hiatal hernia. next EGD 09/2018.   ESOPHAGOGASTRODUODENOSCOPY (EGD) WITH PROPOFOL N/A 07/23/2019   Procedure: ESOPHAGOGASTRODUODENOSCOPY (EGD) WITH PROPOFOL;  Surgeon: Daneil Dolin, MD;  [facility];  Service: Endoscopy;  Laterality: N/A;  3:00pm   EYE SURGERY N/A    Phreesia 03/18/2020   HERNIA REPAIR Right 07/2010   Dr. Zada Girt   IR IMAGING GUIDED PORT INSERTION  02/09/2021   JOINT REPLACEMENT     LAPAROSCOPIC CHOLECYSTECTOMY  2017   at [facility]   POLYPECTOMY  09/14/2015   Procedure: POLYPECTOMY;  Surgeon: Daneil Dolin, MD;  Location: [facility];  Service: Endoscopy;;  ascending colon   right hip replacement  07/2010   went back in sept 2012 to fix   SHOULDER ARTHROSCOPY  2008   left   SPINE SURGERY N/A    Phreesia 03/18/2020   TOTAL HIP REVISION Right 12/17/2012   Procedure: RIGHT TOTAL HIP REVISION;  Surgeon: Mauri Pole, MD;  Location: [facility];  Service: Orthopedics;  Laterality: Right;   WRIST SURGERY Right 2011   open reduction right wrist.    FAMILY HISTORY: Family History  Problem Relation Age of Onset   Hypertension Mother    Stroke Mother    Colon cancer Neg Hx    Anesthesia problems Neg Hx    Hypotension Neg Hx    Malignant hyperthermia Neg Hx    Pseudochol deficiency Neg Hx    Gastric cancer Neg Hx    Esophageal cancer Neg Hx     SOCIAL HISTORY:  reports that she quit smoking about 18 years ago. Her smoking use included cigarettes. She has a 6.25 pack-year smoking history. She has never used smokeless tobacco. She reports that she does not drink alcohol and does not use drugs.  PERFORMANCE STATUS: The patient's performance status is 2 - Symptomatic, <50% confined to bed  PHYSICAL EXAM: Most Recent Vital Signs: Blood pressure (!) 167/86, pulse 79, temperature (!) 97 F (36.1 C), temperature source Oral, resp. rate 17,  height '5\' 5"'$  (1.651 m), weight 140 lb 3.4 oz (63.6 kg), SpO2 98 %. BP (!) 167/86 (BP Location: Left Arm)   Pulse 79   Temp (!) 97 F (36.1 C) (Oral)   Resp 17   Ht '5\' 5"'$  (1.651 m)   Wt 140 lb 3.4 oz (63.6 kg)   SpO2 98%   BMI 23.33 kg/m  General appearance: alert, cooperative, and appears stated age Neurologic: Grossly normal  LABORATORY DATA:  Results for orders placed or performed during the hospital encounter of 08/31/21 (from the past 48 hour(s))  CBG monitoring, ED     Status: None   Collection Time: 08/31/21  3:18 PM  Result Value Ref Range   Glucose-Capillary 99 70 - 99 mg/dL    Comment: Glucose reference range applies only to samples taken after fasting for at least 8 hours.   Comment 1 Notify RN   Basic metabolic panel     Status: Abnormal   Collection Time: 08/31/21  3:33 PM  Result Value Ref Range   Sodium 134 (L) 135 - 145 mmol/L   Potassium 3.8 3.5 - 5.1 mmol/L   Chloride 104 98 - 111 mmol/L   CO2 25 22 - 32 mmol/L   Glucose, Bld 109 (H) 70 - 99 mg/dL    Comment: Glucose reference range applies only to samples taken after fasting for at least 8 hours.   BUN 41 (H) 8 - 23 mg/dL   Creatinine, Ser 2.34 (H) 0.44 - 1.00 mg/dL   Calcium 7.6 (L) 8.9 - 10.3 mg/dL   GFR, Estimated 21 (L) >60 mL/min    Comment: (NOTE) Calculated using the CKD-EPI Creatinine Equation (2021)    Anion gap 5 5 - 15    Comment: Performed at [facility], [address] [place], [place] [address]  CBC     Status: Abnormal   Collection Time: 08/31/21  3:33 PM  Result Value Ref Range   WBC 100.7 (HH) 4.0 - 10.5 K/uL    Comment: This critical result has verified and been called to [facility] SOUTHARD by Rozelle Logan on 07 25 2023 at 1556, and has been read back. CRITICAL RESULTS VERIFIED   RBC 2.61 (L) 3.87 - 5.11 MIL/uL   Hemoglobin 7.9 (L) 12.0 - 15.0 g/dL   HCT 24.2 (L) 36.0 - 46.0 %   MCV 92.7 80.0 - 100.0 fL   MCH 30.3 26.0 - 34.0 pg   MCHC 32.6 30.0 - 36.0 g/dL   RDW 21.5 (H) 11.5 -  15.5 %   Platelets 77 (L) 150 - 400 K/uL    Comment: SPECIMEN CHECKED FOR CLOTS Immature Platelet Fraction may be clinically indicated, consider ordering this additional test [id number] PLATELET COUNT CONFIRMED BY SMEAR    nRBC 2.9 (H) 0.0 - 0.2 %    Comment: Performed at [facility], [address], [place], [place] [address]  Phosphorus     Status: None   Collection Time: 08/31/21  3:47 PM  Result Value Ref Range   Phosphorus 4.2 2.5 - 4.6 mg/dL    Comment: Performed at [facility], [address], [place], [place] [address]  Magnesium     Status: None   Collection Time: 08/31/21  3:47 PM  Result Value Ref Range   Magnesium 2.0 1.7 - 2.4 mg/dL    Comment: Performed at [facility], [address] [place], [place] [address]  Uric acid     Status: None   Collection Time: 08/31/21  3:47 PM  Result Value Ref Range   Uric Acid, Serum 4.2 2.5 - 7.1 mg/dL    Comment: Performed at [facility], [address] [place], [place] [address]  Protime-INR     Status: Abnormal   Collection Time: 08/31/21  3:47 PM  Result Value Ref Range   Prothrombin Time 17.3 (H) 11.4 - 15.2 seconds   INR 1.4 (H) 0.8 - 1.2    Comment: (NOTE) INR goal varies based on device and disease states. Performed at [facility], [address], [place], [place] [address]   APTT     Status: Abnormal   Collection Time: 08/31/21  3:47 PM  Result Value Ref Range  aPTT 168 (HH) 24 - 36 seconds    Comment:        IF BASELINE aPTT IS ELEVATED, SUGGEST PATIENT RISK ASSESSMENT BE USED TO DETERMINE APPROPRIATE ANTICOAGULANT THERAPY. REPEATED TO VERIFY CRITICAL RESULT CALLED TO, READ BACK BY AND VERIFIED WITH: TIFFANY SOUTHARD @ 1829 ON 08/31/21 C VARNER Performed at [facility], [address], [place], [place] [address]   Troponin I (High Sensitivity)     Status: None   Collection Time: 08/31/21  3:47 PM  Result Value Ref Range   Troponin I (High Sensitivity) 7 <18 ng/L    Comment: (NOTE) Elevated high sensitivity  troponin I (hsTnI) values and significant  changes across serial measurements may suggest ACS but many other  chronic and acute conditions are known to elevate hsTnI results.  Refer to the "Links" section for chest pain algorithms and additional  guidance. Performed at [facility], [address] [place] [address] [place], [place] [address]   Blood culture (routine x 2)     Status: None (Preliminary result)   Collection Time: 08/31/21  3:49 PM   Specimen: BLOOD LEFT HAND  Result Value Ref Range   Specimen Description BLOOD LEFT HAND    Special Requests      Immunocompromised BOTTLES DRAWN AEROBIC ONLY Blood Culture results may not be optimal due to an inadequate volume of blood received in culture bottles   Culture      NO GROWTH < 12 HOURS Performed at [facility], [address], [place], [place] [address]    Report Status PENDING   Lactic acid, plasma     Status: None   Collection Time: 08/31/21  3:50 PM  Result Value Ref Range   Lactic Acid, Venous 0.6 0.5 - 1.9 mmol/L    Comment: Performed at [facility], [address] [place], [place] [address]  Procalcitonin - Baseline     Status: None   Collection Time: 08/31/21  3:50 PM  Result Value Ref Range   Procalcitonin 0.74 ng/mL    Comment:        Interpretation: PCT > 0.5 ng/mL and <= 2 ng/mL: Systemic infection (sepsis) is possible, but other conditions are known to elevate PCT as well. (NOTE)       Sepsis PCT Algorithm           Lower Respiratory Tract                                      Infection PCT Algorithm    ----------------------------     ----------------------------         PCT < 0.25 ng/mL                PCT < 0.10 ng/mL          Strongly encourage             Strongly discourage   discontinuation of antibiotics    initiation of antibiotics    ----------------------------     -----------------------------       PCT 0.25 - 0.50 ng/mL            PCT 0.10 - 0.25 ng/mL               OR       >80% decrease in PCT             Discourage initiation of  antibiotics      Encourage discontinuation           of antibiotics    ----------------------------     -----------------------------         PCT >= 0.50 ng/mL              PCT 0.26 - 0.50 ng/mL                AND       <80% decrease in PCT             Encourage initiation of                                             antibiotics       Encourage continuation           of antibiotics    ----------------------------     -----------------------------        PCT >= 0.50 ng/mL                  PCT > 0.50 ng/mL               AND         increase in PCT                  Strongly encourage                                      initiation of antibiotics    Strongly encourage escalation           of antibiotics                                     -----------------------------                                           PCT <= 0.25 ng/mL                                                 OR                                        > 80% decrease in PCT                                      Discontinue / Do not initiate                                             antibiotics  Performed at [facility], [address], [place], [place] [address]   Blood culture (routine x 2)     Status: None (Preliminary result)  Collection Time: 08/31/21  3:54 PM   Specimen: Site Not Specified; Blood  Result Value Ref Range   Specimen Description SITE NOT SPECIFIED    Special Requests      BOTTLES DRAWN AEROBIC ONLY Blood Culture results may not be optimal due to an inadequate volume of blood received in culture bottles   Culture      NO GROWTH < 24 HOURS Performed at [facility], [address], [place], [place] [address]    Report Status PENDING   Type and screen [facility]     Status: None (Preliminary result)   Collection Time: 08/31/21  4:38 PM  Result Value Ref Range   ABO/RH(D) O POS    Antibody Screen POS    Sample  Expiration 09/03/2021,2359    DAT, IgG NEG    Antibody Identification ANTI K    Unit Number [id number]    Blood Component Type RED CELLS,LR    Unit division 00    Status of Unit ALLOCATED    Donor AG Type NEGATIVE FOR KELL ANTIGEN    Transfusion Status OK TO TRANSFUSE    Crossmatch Result COMPATIBLE    Unit Number      [id number] Performed at [facility], [place] [address], [place], [place] [address]    Blood Component Type      RED CELLS,LR Performed at [place] [address], [place], [place] [address]    Unit division      00 Performed at [facility], [place] [address], [place], [place] [address]    Status of Unit      ALLOCATED Performed at [facility], [address] [place], [place] [address]    Donor AG Type NEGATIVE FOR KELL ANTIGEN    Transfusion Status OK TO TRANSFUSE    Crossmatch Result COMPATIBLE   Troponin I (High Sensitivity)     Status: None   Collection Time: 08/31/21  7:38 PM  Result Value Ref Range   Troponin I (High Sensitivity) 8 <18 ng/L    Comment: (NOTE) Elevated high sensitivity troponin I (hsTnI) values and significant  changes across serial measurements may suggest ACS but many other  chronic and acute conditions are known to elevate hsTnI results.  Refer to the "Links" section for chest pain algorithms and additional  guidance. Performed at [facility], [address], [place], [place] [address]   Lactic acid, plasma     Status: None   Collection Time: 08/31/21  7:50 PM  Result Value Ref Range   Lactic Acid, Venous 0.6 0.5 - 1.9 mmol/L    Comment: Performed at [facility], [address], [place], [place] [address]  MRSA Next Gen by PCR, Nasal     Status: None   Collection Time: 09/01/21  1:45 AM   Specimen: Nasal Mucosa; Nasal Swab  Result Value Ref Range   MRSA by PCR Next Gen NOT DETECTED NOT DETECTED    Comment: (NOTE) The GeneXpert MRSA Assay (FDA approved for NASAL specimens only), is one component of a  comprehensive MRSA colonization surveillance program. It is not intended to diagnose MRSA infection nor to guide or monitor treatment for MRSA infections. Test performance is not FDA approved in patients less than 47 years old. Performed at [facility], [address], [place], [place] [address]   Comprehensive metabolic panel     Status: Abnormal   Collection Time: 09/01/21  4:22 AM  Result Value Ref Range  Sodium 142 135 - 145 mmol/L    Comment: DELTA CHECK NOTED   Potassium 3.7 3.5 - 5.1 mmol/L   Chloride 111 98 - 111 mmol/L   CO2 26 22 - 32 mmol/L   Glucose, Bld 92 70 - 99 mg/dL    Comment: Glucose reference range applies only to samples taken after fasting for at least 8 hours.   BUN 36 (H) 8 - 23 mg/dL   Creatinine, Ser 1.70 (H) 0.44 - 1.00 mg/dL   Calcium 7.9 (L) 8.9 - 10.3 mg/dL   Total Protein 6.3 (L) 6.5 - 8.1 g/dL   Albumin 3.1 (L) 3.5 - 5.0 g/dL   AST 37 15 - 41 U/L   ALT 18 0 - 44 U/L   Alkaline Phosphatase 143 (H) 38 - 126 U/L   Total Bilirubin 0.9 0.3 - 1.2 mg/dL   GFR, Estimated 31 (L) >60 mL/min    Comment: (NOTE) Calculated using the CKD-EPI Creatinine Equation (2021)    Anion gap 5 5 - 15    Comment: Performed at [facility], [address], [place], [place] [address]  Magnesium     Status: None   Collection Time: 09/01/21  4:22 AM  Result Value Ref Range   Magnesium 1.7 1.7 - 2.4 mg/dL    Comment: Performed at [facility], [address] [place], [place] [address]  CBC with Differential/Platelet     Status: Abnormal   Collection Time: 09/01/21  4:22 AM  Result Value Ref Range   WBC 109.0 (HH) 4.0 - 10.5 K/uL    Comment: WHITE COUNT CONFIRMED ON SMEAR THIS CRITICAL RESULT HAS VERIFIED AND BEEN CALLED TO TINAJERO,E BY BOBBIE MATTHEWS ON 07 26 2023 AT 0612, AND HAS BEEN READ BACK.     RBC 2.54 (L) 3.87 - 5.11 MIL/uL   Hemoglobin 7.7 (L) 12.0 - 15.0 g/dL   HCT 23.6 (L) 36.0 - 46.0 %   MCV 92.9 80.0 - 100.0 fL   MCH 30.3 26.0 - 34.0 pg   MCHC 32.6 30.0  - 36.0 g/dL   RDW 21.7 (H) 11.5 - 15.5 %   Platelets 108 (L) 150 - 400 K/uL    Comment: SPECIMEN CHECKED FOR CLOTS Immature Platelet Fraction may be clinically indicated, consider ordering this additional test [id number] PLATELET COUNT CONFIRMED BY SMEAR    nRBC 3.5 (H) 0.0 - 0.2 %   Neutrophils Relative % 42 %   Neutro Abs 58.9 (H) 1.7 - 7.7 K/uL   Band Neutrophils 12 %   Lymphocytes Relative 13 %   Lymphs Abs 14.2 (H) 0.7 - 4.0 K/uL   Monocytes Relative 3 %   Monocytes Absolute 3.3 (H) 0.1 - 1.0 K/uL   Eosinophils Relative 0 %   Eosinophils Absolute 0.0 0.0 - 0.5 K/uL   Basophils Relative 1 %   Basophils Absolute 1.1 (H) 0.0 - 0.1 K/uL   WBC Morphology BLASTS PRESENT    RBC Morphology See Note     Comment: NUCLEATED RBCS PRESENT   nRBC 5 (H) 0 /100 WBC   Metamyelocytes Relative 6 %   Myelocytes 11 %   Promyelocytes Relative 6 %   Blasts 6 %   Abs Immature Granulocytes 25.10 (H) 0.00 - 0.07 K/uL   Reactive, Benign Lymphocytes PRESENT    Polychromasia PRESENT     Comment: Performed at [facility], [address], [place], [place] [address]  TSH     Status: None   Collection Time: 09/01/21  4:22 AM  Result Value Ref Range   TSH 1.158 0.350 - 4.500 uIU/mL    Comment: Performed by a 3rd Generation assay with a functional sensitivity of <=0.01 uIU/mL. Performed at [facility], [address], [place], [place] [address]   SARS Coronavirus 2 by RT PCR (hospital order, performed in [facility] hospital lab) *cepheid single result test* Anterior Nasal Swab     Status: None   Collection Time: 09/01/21  7:50 AM   Specimen: Anterior Nasal Swab  Result Value Ref Range   SARS Coronavirus 2 by RT PCR NEGATIVE NEGATIVE    Comment: (NOTE) SARS-CoV-2 target nucleic acids are NOT DETECTED.  The SARS-CoV-2 RNA is generally detectable in upper and lower respiratory specimens during the acute phase of infection. The lowest concentration of SARS-CoV-2 viral copies this assay can detect is  250 copies / mL. A negative result does not preclude SARS-CoV-2 infection and should not be used as the sole basis for treatment or other patient management decisions.  A negative result may occur with improper specimen collection / handling, submission of specimen other than nasopharyngeal swab, presence of viral mutation(s) within the areas targeted by this assay, and inadequate number of viral copies (<250 copies / mL). A negative result must be combined with clinical observations, patient history, and epidemiological information.  Fact Sheet for Patients:   https://www.patel.info/  Fact Sheet for Healthcare Providers: https://hall.com/  This test is not yet approved or  cleared by the [place] FDA and has been authorized for detection and/or diagnosis of SARS-CoV-2 by FDA under an Emergency Use Authorization (EUA).  This EUA will remain in effect (meaning this test can be used) for the duration of the COVID-19 declaration under Section 564(b)(1) of the Act, 21 U.S.C. section 360bbb-3(b)(1), unless the authorization is terminated or revoked sooner.  Performed at [facility], [address], [place], [place] [address]       RADIOGRAPHY: DG CHEST PORT 1 VIEW  Result Date: 09/01/2021 CLINICAL DATA:  Acute kidney injury and hypotension.  SIRS. EXAM: PORTABLE CHEST 1 VIEW COMPARISON:  08/31/2021 FINDINGS: Power injectable right Port-A-Cath tip: Lower SVC. Atherosclerotic calcification of the aortic arch. Upper normal heart size. Cephalization of blood flow on this mostly upright image, raising the possibility of pulmonary venous hypertension. Linear subsegmental atelectasis or scarring along the left hemidiaphragm. IMPRESSION: 1. Cephalization of blood flow suggesting pulmonary venous hypertension. Upper normal heart size. 2. Subsegmental atelectasis along the left hemidiaphragm. 3.  Aortic Atherosclerosis (ICD10-I70.0). Electronically  Signed   By: Van Clines M.D.   On: 09/01/2021 08:17   DG HIP UNILAT WITH PELVIS 2-3 VIEWS RIGHT  Result Date: 09/01/2021 CLINICAL DATA:  [place] 4 days ago with continued right hip pain. Right hip replacement 2012, revision 2014. EXAM: DG HIP (WITH OR WITHOUT PELVIS) 2-3V RIGHT COMPARISON:  Recent CT of abdomen and pelvis and reconstructions of 08/25/2021. FINDINGS: There is no evidence of hip fracture or dislocation. Right hip total joint arthroplasty appears well seated. Bone mineralization is osteopenic. There is mild arthrosis of the left hip joint. Both SI joints and pubic symphysis are unremarkable, as visualized. There are patchy calcifications in the right femoral artery. Artifact from overlying clothing. Soft tissues are otherwise unremarkable. IMPRESSION: Right hip arthroplasty.  Osteopenia without evidence of fractures. Electronically Signed   By: Telford Nab M.D.   On: 09/01/2021 05:33   CT HEAD WO CONTRAST (5MM)  Result Date: 09/01/2021 CLINICAL DATA:  Initial evaluation for head trauma. EXAM: CT HEAD WITHOUT CONTRAST  TECHNIQUE: Contiguous axial images were obtained from the base of the skull through the vertex without intravenous contrast. RADIATION DOSE REDUCTION: This exam was performed according to the departmental dose-optimization program which includes automated exposure control, adjustment of the mA and/or kV according to patient size and/or use of iterative reconstruction technique. COMPARISON:  Prior CT from 01/22/2015. FINDINGS: Brain: Mild age-related cerebral atrophy. No acute intracranial hemorrhage. No acute large vessel territory infarct. No mass lesion, mass effect, or midline shift. No hydrocephalus or extra-axial fluid collection. Vascular: No hyperdense vessel. Skull: Scalp soft tissues demonstrate no acute finding. Calvarium intact. Osteopenia noted. Sinuses/Orbits: Globes and orbital soft tissues demonstrate no acute finding. Paranasal sinuses are largely clear. No  mastoid effusion. Other: None. IMPRESSION: 1. No acute intracranial abnormality. 2. Mild age-related cerebral atrophy. Electronically Signed   By: Jeannine Boga M.D.   On: 09/01/2021 03:13   DG Chest Port 1 View  Result Date: 08/31/2021 CLINICAL DATA:  Hypotension, possible sepsis EXAM: PORTABLE CHEST 1 VIEW COMPARISON:  Portable exam 1559 hours compared to 08/25/2021 FINDINGS: RIGHT jugular Port-A-Cath with tip projecting over SVC. Upper normal heart size. Mediastinal contours and pulmonary vascularity normal. Lungs clear. No pulmonary infiltrate, pleural effusion, or pneumothorax. Bones demineralized. IMPRESSION: No acute abnormalities. Electronically Signed   By: Lavonia Dana M.D.   On: 08/31/2021 16:08       ASSESSMENT and PLAN:  1.  Myeloproliferative neoplasm: - White count today is 109, platelet count 108.  Predominantly neutrophils, lymphocytes and monocytes. - Would recommend decreasing Jakafi to 15 mg once daily.  2.  Possible pneumonia: - She is having lots of cough, mostly nonproductive.  Chest x-ray was nonrevealing. - Due to immunocompromise status, I have recommended CT scan of the chest without contrast. - She is also reportedly on pantoprazole for acid reflux for several years. - Continue cefepime.  Cultures as of today have not grown any.  3.  Anemia: - From underlying myeloproliferative neoplasm.  Hemoglobin today 7.7.  Will transfuse 1 unit PRBC.  All questions were answered. The patient knows to call the clinic with any problems, questions or concerns. We can certainly see the patient much sooner if necessary.    Derek Jack

## 2021-09-01 NOTE — Evaluation (Addendum)
Physical Therapy Evaluation Patient Details Name: Ayris Carano MRN: [id number] DOB: [birth year] Today's Date: 09/01/2021  History of Present Illness  Junnie Loschiavo is a 76 y.o. female with medical history significant of acute cholangitis, allergies, anemia, Barrett's esophagus, CKD, GERD,, hypertension, history of blood transfusion with 2 units at the last admission, systemic lupus, myeloproliferative neoplasm and more presents the ED with a chief complaint of hypotension.  Her blood pressures have stabilized with the use of IV fluid and she was noted to have some poor p.o. intake and was admitted for AKI.  She was also thought to have some pneumonia and was started on vancomycin and cefepime which has now been transitioned to cefepime only.  This is a recurrent hospitalization for her and she would like to see her oncologist to see if her medications could be contributing to her recurrence of hospitalization.  Oncology consulted.   Clinical Impression  Patient standing at bedside returning from [facility] and daughter standing beside her upon therapist arrival. Patient agreeable to participating in PT evaluation. Patient reports a fall at home and that her Rollator does not have brakes that can be set for Rollator to stay in place when she is doing other things with her hands. Patient is modified independent with bed mobility and min guard to supervision with transfers and ambulation using RW due to being in an unfamiliar environment.Patient exhibited good endurance for activity although demonstrated shortness of breath during ambulation when patient conversant throughout. Patient had a tendency to walk with RW with left hand only at times while gesturing with right hand. PT spoke with patient and daughter about slowing down and thinking about her next move and whether there is a safer way to complete it. Patient in agreement. Patient evaluated by Physical Therapy with no further acute PT needs identified. All  education has been completed and the patient has no further questions. See below for any follow-up Physical Therapy or equipment needs. PT is signing off. Thank you for this referral..       Recommendations for follow up therapy are one component of a multi-disciplinary discharge planning process, led by the attending physician.  Recommendations may be updated based on patient status, additional functional criteria and insurance authorization.  Follow Up Recommendations No PT follow up      Assistance Recommended at Discharge PRN  Patient can return home with the following  Assistance with cooking/housework;Assist for transportation;Help with stairs or ramp for entrance    Equipment Recommendations Rollator (4 wheels)  Recommendations for Other Services       Functional Status Assessment Patient has had a recent decline in their functional status and demonstrates the ability to make significant improvements in function in a reasonable and predictable amount of time.     Precautions / Restrictions Precautions Precautions: Fall Precaution Comments: reports a fall prior to admission Restrictions Weight Bearing Restrictions: No      Mobility  Bed Mobility Overal bed mobility: Modified Independent     General bed mobility comments: extended time with mild labored movement.    Transfers Overall transfer level: Modified independent Equipment used: Rolling walker (2 wheels)     General transfer comment: Appropriate strength and coordination demonstrated    Ambulation/Gait Ambulation/Gait assistance: Supervision Gait Distance (Feet): 225 Feet Assistive device: Rolling walker (2 wheels) Gait Pattern/deviations: Decreased step length - left, Decreased stride length, Decreased step length - right, Decreased weight shift to right, Decreased stance time - right, Antalgic Gait velocity: decreased  General Gait Details: somewhat slow cadence using RW; conversant throughout  with mild complaints of shortness of breath; supervision and use of RW. Mild gait deviations with mild antalgia over the R LE ER but able to maintain appropriate gait pattern. Minimal balance deficits.  Stairs            Wheelchair Mobility    Modified Rankin (Stroke Patients Only)       Balance Overall balance assessment: History of Falls, Mild deficits observed, not formally tested         Pertinent Vitals/Pain Pain Assessment Pain Assessment: No/denies pain    Home Living Family/patient expects to be discharged to:: Private residence Living Arrangements: Spouse/significant other Available Help at Discharge: Family;Available 24 hours/day Type of Home: House Home Access: Stairs to enter Entrance Stairs-Rails: Chemical engineer of Steps: 5   Home Layout: Two level;Able to live on main level with bedroom/bathroom Home Equipment: Rollator (4 wheels);Grab bars - tub/shower;BSC/3in1;Cane - single point      Prior Function Prior Level of Function : Independent/Modified Independent   Mobility Comments: pt reports abilty to ambulate at home and in communinty with Rollator. Reports that she drives sometimes ADLs Comments: Independent with ADLs     Hand Dominance   Dominant Hand: Right    Extremity/Trunk Assessment   Upper Extremity Assessment Upper Extremity Assessment: Overall WFL for tasks assessed    Lower Extremity Assessment Lower Extremity Assessment: Overall WFL for tasks assessed    Cervical / Trunk Assessment Cervical / Trunk Assessment: Normal  Communication   Communication: No difficulties  Cognition Arousal/Alertness: Awake/alert Behavior During Therapy: WFL for tasks assessed/performed Overall Cognitive Status: Within Functional Limits for tasks assessed        General Comments      Exercises     Assessment/Plan    PT Assessment Patient does not need any further PT services  PT Problem List         PT Treatment  Interventions      PT Goals (Current goals can be found in the Care Plan section)       Frequency          AM-PAC PT "6 Clicks" Mobility  Outcome Measure Help needed turning from your back to your side while in a flat bed without using bedrails?: None Help needed moving from lying on your back to sitting on the side of a flat bed without using bedrails?: None Help needed moving to and from a bed to a chair (including a wheelchair)?: A Little Help needed standing up from a chair using your arms (e.g., wheelchair or bedside chair)?: None Help needed to walk in hospital room?: A Little Help needed climbing 3-5 steps with a railing? : A Little 6 Click Score: 21    End of Session   Activity Tolerance: Patient tolerated treatment well;Patient limited by fatigue Patient left: in bed;with call bell/phone within reach;with family/visitor present Nurse Communication: Mobility status PT Visit Diagnosis: History of falling (Z91.81);Other abnormalities of gait and mobility (R26.89);Unsteadiness on feet (R26.81)    Time: 9169-4503 PT Time Calculation (min) (ACUTE ONLY): 28 min   Charges:     PT Treatments $Therapeutic Activity: 8-22 mins        Floria Raveling. Hartnett-Rands, MS, PT Per [place] [phone]  Pamala Hurry  Hartnett-Rands 09/01/2021, 2:55 PM

## 2021-09-01 NOTE — Assessment & Plan Note (Signed)
-   Respiratory rate 23, leukocytosis 100.7 with history of leukemia -Patient also has endorgan damage with AKI and acute respiratory failure with hypoxia requiring 2 L nasal cannula -Chest x-ray initially showed no acute abnormalities -Patient does have a cough, and hypoxia so we will repeat chest x-ray in the a.m. after adequate fluid hydration -Procalcitonin +0.74, starting HCAP antibiotics given patient's recent discharge from the hospital -Blood cultures pending -Expectorated sputum culture, urine antigens -Continue to monitor

## 2021-09-01 NOTE — Progress Notes (Addendum)
Chemo drug was placed in med bag and given to [place] the [facility]. Ann states pharmacy will review it in the morning so patient can receive her dose.

## 2021-09-01 NOTE — Progress Notes (Addendum)
Nurse tech states patient's temp was 101.7. The room is set on 80 and the patient has 6 blankets on her. This nurse cut the temperature down in the room and took off blankets. Patient was informed that we will recheck temperature. Patient states she is allergic to tylenol. Night shift nurse notified. MD has signed off from patient so night shift nurse knows to message night shift doctor if patient needs medicine for her fever.

## 2021-09-02 ENCOUNTER — Inpatient Hospital Stay (HOSPITAL_COMMUNITY): Payer: Medicare Other

## 2021-09-02 ENCOUNTER — Inpatient Hospital Stay: Payer: Medicare Other | Admitting: Family Medicine

## 2021-09-02 DIAGNOSIS — J9601 Acute respiratory failure with hypoxia: Secondary | ICD-10-CM | POA: Diagnosis not present

## 2021-09-02 DIAGNOSIS — D471 Chronic myeloproliferative disease: Secondary | ICD-10-CM | POA: Diagnosis not present

## 2021-09-02 DIAGNOSIS — N179 Acute kidney failure, unspecified: Secondary | ICD-10-CM | POA: Diagnosis not present

## 2021-09-02 DIAGNOSIS — R6510 Systemic inflammatory response syndrome (SIRS) of non-infectious origin without acute organ dysfunction: Secondary | ICD-10-CM | POA: Diagnosis not present

## 2021-09-02 LAB — RESPIRATORY PANEL BY PCR

## 2021-09-02 LAB — CBC WITH DIFFERENTIAL/PLATELET
Abs Immature Granulocytes: 27 10*3/uL — ABNORMAL HIGH (ref 0.00–0.07)
Band Neutrophils: 6 %
Basophils Absolute: 0 10*3/uL (ref 0.0–0.1)
Basophils Relative: 0 %
Blasts: 3 %
Eosinophils Absolute: 0 10*3/uL (ref 0.0–0.5)
Eosinophils Relative: 0 %
HCT: 23.4 % — ABNORMAL LOW (ref 36.0–46.0)
Hemoglobin: 7.6 g/dL — ABNORMAL LOW (ref 12.0–15.0)
Lymphocytes Relative: 8 %
Lymphs Abs: 8 10*3/uL — ABNORMAL HIGH (ref 0.7–4.0)
MCH: 30.3 pg (ref 26.0–34.0)
MCHC: 32.5 g/dL (ref 30.0–36.0)
MCV: 93.2 fL (ref 80.0–100.0)
Metamyelocytes Relative: 7 %
Monocytes Absolute: 9 10*3/uL — ABNORMAL HIGH (ref 0.1–1.0)
Monocytes Relative: 9 %
Myelocytes: 14 %
Neutro Abs: 53.1 10*3/uL — ABNORMAL HIGH (ref 1.7–7.7)
Neutrophils Relative %: 47 %
Platelets: 132 10*3/uL — ABNORMAL LOW (ref 150–400)
Promyelocytes Relative: 6 %
RBC: 2.51 MIL/uL — ABNORMAL LOW (ref 3.87–5.11)
RDW: 22.5 % — ABNORMAL HIGH (ref 11.5–15.5)
WBC: 100.1 10*3/uL (ref 4.0–10.5)
nRBC: 3.1 % — ABNORMAL HIGH (ref 0.0–0.2)
nRBC: 6 /100 WBC — ABNORMAL HIGH

## 2021-09-02 LAB — URINALYSIS, ROUTINE W REFLEX MICROSCOPIC
Bilirubin Urine: NEGATIVE
Glucose, UA: NEGATIVE mg/dL
Ketones, ur: NEGATIVE mg/dL
Nitrite: NEGATIVE
Protein, ur: 30 mg/dL — AB
Specific Gravity, Urine: 1.013 (ref 1.005–1.030)
pH: 6 (ref 5.0–8.0)

## 2021-09-02 LAB — BASIC METABOLIC PANEL
Anion gap: 4 — ABNORMAL LOW (ref 5–15)
BUN: 23 mg/dL (ref 8–23)
CO2: 25 mmol/L (ref 22–32)
Calcium: 8.1 mg/dL — ABNORMAL LOW (ref 8.9–10.3)
Chloride: 113 mmol/L — ABNORMAL HIGH (ref 98–111)
Creatinine, Ser: 1.26 mg/dL — ABNORMAL HIGH (ref 0.44–1.00)
GFR, Estimated: 45 mL/min — ABNORMAL LOW (ref >60)
Glucose, Bld: 83 mg/dL (ref 70–99)
Potassium: 4.2 mmol/L (ref 3.5–5.1)
Sodium: 142 mmol/L (ref 135–145)

## 2021-09-02 LAB — PROCALCITONIN: Procalcitonin: 0.27 ng/mL

## 2021-09-02 LAB — STREP PNEUMONIAE URINARY ANTIGEN: Strep Pneumo Urinary Antigen: NEGATIVE

## 2021-09-02 LAB — MAGNESIUM: Magnesium: 1.6 mg/dL — ABNORMAL LOW (ref 1.7–2.4)

## 2021-09-02 LAB — PREPARE RBC (CROSSMATCH)

## 2021-09-02 LAB — BRAIN NATRIURETIC PEPTIDE: B Natriuretic Peptide: 569 pg/mL — ABNORMAL HIGH (ref 0.0–100.0)

## 2021-09-02 MED ORDER — SODIUM CHLORIDE 0.9 % IV SOLN
2.0000 g | Freq: Two times a day (BID) | INTRAVENOUS | Status: DC
  Administered 2021-09-02 – 2021-09-03 (×2): 2 g via INTRAVENOUS
  Filled 2021-09-02 (×2): qty 12.5

## 2021-09-02 MED ORDER — SODIUM CHLORIDE 0.9% IV SOLUTION: Freq: Once | INTRAVENOUS | Status: DC

## 2021-09-02 MED ORDER — SALINE SPRAY 0.65 % NA SOLN
1.0000 | NASAL | Status: DC | PRN
  Administered 2021-09-02: 1 via NASAL
  Filled 2021-09-02: qty 44

## 2021-09-02 MED ORDER — MAGNESIUM SULFATE 2 GM/50ML IV SOLN
2.0000 g | Freq: Once | INTRAVENOUS | Status: AC
  Administered 2021-09-02: 2 g via INTRAVENOUS
  Filled 2021-09-02: qty 50

## 2021-09-02 MED ORDER — FAMOTIDINE 20 MG PO TABS
20.0000 mg | ORAL_TABLET | Freq: Every day | ORAL | Status: DC
  Administered 2021-09-03: 20 mg via ORAL
  Filled 2021-09-02 (×2): qty 1

## 2021-09-02 MED ORDER — RUXOLITINIB PHOSPHATE 15 MG PO TABS
15.0000 mg | ORAL_TABLET | Freq: Every day | ORAL | Status: DC
  Administered 2021-09-02 – 2021-09-03 (×2): 15 mg via ORAL
  Filled 2021-09-02: qty 1

## 2021-09-02 NOTE — Progress Notes (Signed)
   09/02/21 1716  Assess: MEWS Score  Temp 98.4 F (36.9 C)  BP (!) 96/54 (Re-checked: 121/97)  MAP (mmHg) 68  Pulse Rate (!) 56 (Re-checked: 77)  Resp 16  SpO2 96 %  O2 Device Room Air  Assess: MEWS Score  MEWS Temp 0  MEWS Systolic 1  MEWS Pulse 0  MEWS RR 0  MEWS LOC 0  MEWS Score 1  MEWS Score Color Green  Assess: SIRS CRITERIA  SIRS Temperature  0  SIRS Pulse 0  SIRS Respirations  0  SIRS WBC 1  SIRS Score Sum  1

## 2021-09-02 NOTE — Progress Notes (Signed)
Tele called patient tachy arrhythmia, lasted 7 seconds heart rate 140 then returned to 58. Noted patient was sleeping in room. MD Manuella Ghazi made aware.

## 2021-09-02 NOTE — Progress Notes (Signed)
   09/02/21 1518  Assess: MEWS Score  Temp 98.4 F (36.9 C)  BP (!) 95/48 (rechecked BP 107/52)  Pulse Rate (!) 57  Resp (!) 26  SpO2 93 %  O2 Device Room Air  Assess: MEWS Score  MEWS Temp 0  MEWS Systolic 1  MEWS Pulse 0  MEWS RR 2  MEWS LOC 0  MEWS Score 3  MEWS Score Color Yellow  Assess: if the MEWS score is Yellow or Red  Were vital signs taken at a resting state? Yes  Focused Assessment Change from prior assessment (see assessment flowsheet)  Does the patient meet 2 or more of the SIRS criteria? Yes  Does the patient have a confirmed or suspected source of infection? No  MEWS guidelines implemented *See Row Information* Yes  Treat  Pain Scale 0-10  Pain Score 0  Take Vital Signs  Increase Vital Sign Frequency  Yellow: Q 2hr X 2 then Q 4hr X 2, if remains yellow, continue Q 4hrs  Escalate  MEWS: Escalate Yellow: discuss with charge nurse/RN and consider discussing with provider and RRT  Notify: Charge Nurse/RN  Name of Charge Nurse/RN Notified Larene Beach RN  Date Charge Nurse/RN Notified 09/02/21  Time Charge Nurse/RN Notified 1520  Notify: Provider  Provider Name/Title Eligah East MD  Date Provider Notified 09/02/21  Time Provider Notified 1520  Method of Notification Page (Secure Chat)  Notification Reason Change in status (Yellow MEWS)  Provider response See new orders  Date of Provider Response 09/02/21  Time of Provider Response 1536  Assess: SIRS CRITERIA  SIRS Temperature  0  SIRS Pulse 0  SIRS Respirations  1  SIRS WBC 1  SIRS Score Sum  2

## 2021-09-02 NOTE — Progress Notes (Addendum)
PROGRESS NOTE    Renee Waters  TFT:[id number] DOB: 12-19-1945 DOA: 08/31/2021 PCP: [name], MD   Brief Narrative:    Renee Waters is a 76 y.o. female with medical history significant of acute cholangitis, allergies, anemia, Barrett's esophagus, CKD, GERD,, hypertension, history of blood transfusion with 2 units at the last admission, systemic lupus, myeloproliferative neoplasm and more presents the ED with a chief complaint of hypotension.  Her blood pressures have stabilized with the use of IV fluid and she was noted to have some poor p.o. intake and was admitted for AKI.  She was also thought to have some pneumonia and was started on vancomycin and cefepime which has now been transitioned to cefepime only.  She is seen by oncology 7/26 with recommendation for 1 unit PRBC transfusion and transition to once daily and Jakafi.  Respiratory viral panel pending and CT chest without any acute findings.  Assessment & Plan:   Principal Problem:   AKI (acute kidney injury) ([facility]) Active Problems:   Depression   Essential hypertension   GERD (gastroesophageal reflux disease)   Anxiety   MPN (myeloproliferative neoplasm) (HCC)   SIRS (systemic inflammatory response syndrome) (HCC)   Acute respiratory failure with hypoxia (HCC)   Fall at home, initial encounter   Hypotension  Assessment and Plan:  AKI (acute kidney injury) (HCC)-improving -Creatinine now near baseline 1.2 -Reports poor p.o. intake -Patient hypotensive at 80/40 prior to arrival -Patient received fluid bolus in the ED -Continue IV fluids through the night -Hold nephrotoxic agents when possible -Continue to monitor  Fall at home, initial encounter - Fall of unknown mechanism 3-4 days ago -Patient reports loss of consciousness -She continues to have left hip pain -Without contrast, x-ray left hip -PT eval and treat -Monitor on telemetry  Acute respiratory failure with hypoxia (HCC)-resolved Requiring  2-3 L nasal cannula Suspicion for pneumonia See treatment plan for SIRS  SIRS (systemic inflammatory response syndrome) (HCC) - Respiratory rate 23, leukocytosis 100.7 with history of leukemia -Patient also has endorgan damage with AKI and acute respiratory failure with hypoxia requiring 2 L nasal cannula -Chest x-ray initially showed no acute abnormalities -Patient does have a cough, and hypoxia so we will repeat chest x-ray in the a.m. after adequate fluid hydration -Procalcitonin +0.74, starting HCAP antibiotics given patient's recent discharge from the hospital -Blood cultures pending -Expectorated sputum culture, urine antigens -Continue to monitor -CT chest 7/26 with no acute findings, check respiratory viral panel  Hypomagnesemia -Replete and reevaluate in a.m.  MPN (myeloproliferative neoplasm) (HCC) - With anemia, thrombocytopenia, leukocytosis -Follows with [name] -Recently 2 units blood transfusion at last admission, hemoglobin was up to 9 and down to 7.9 today -Does report dizziness, no shortness of breath but is requiring oxygen to maintain oxygen saturations -Appreciate oncology consultation 7/26 and will transfuse 1 unit PRBC 7/27   Anxiety - Continue Ativan as needed -Continue Celexa  GERD (gastroesophageal reflux disease) Continue PPI  Essential hypertension - Holding losartan and metoprolol in the setting of hypotension  Depression - Continue Celexa, Remeron -Continue Restoril for associated insomnia   DVT prophylaxis: Heparin Code Status: DNR Family Communication: Discussed with daughter at bedside 7/26 Disposition Plan:  Status is: Inpatient Remains inpatient appropriate because: Need for IV medications and transfusion   Consultants:  Oncology  Procedures:  None  Antimicrobials:  Anti-infectives (From admission, onward)    Start     Dose/Rate Route Frequency Ordered Stop   09/02/21 1200  ceFEPIme (MAXIPIME) 2 g in  sodium chloride  0.9 % 100 mL IVPB        2 g 200 mL/hr over 30 Minutes Intravenous Every 12 hours 09/02/21 0827     09/02/21 0000  ceFEPIme (MAXIPIME) 2 g in sodium chloride 0.9 % 100 mL IVPB  Status:  Discontinued        2 g 200 mL/hr over 30 Minutes Intravenous Every 24 hours 09/01/21 0645 09/02/21 0827   09/01/21 0645  vancomycin variable dose per unstable renal function (pharmacist dosing)  Status:  Discontinued         Does not apply See admin instructions 09/01/21 0645 09/01/21 1019   09/01/21 0400  vancomycin (VANCOREADY) IVPB 1250 mg/250 mL        1,250 mg 166.7 mL/hr over 90 Minutes Intravenous  Once 09/01/21 0307 09/01/21 0546   09/01/21 0315  vancomycin (VANCOCIN) IVPB 1000 mg/200 mL premix  Status:  Discontinued        1,000 mg 200 mL/hr over 60 Minutes Intravenous  Once 09/01/21 0226 09/01/21 0307   09/01/21 0315  ceFEPIme (MAXIPIME) 2 g in sodium chloride 0.9 % 100 mL IVPB        2 g 200 mL/hr over 30 Minutes Intravenous  Once 09/01/21 0226 09/01/21 0415       Subjective: Patient seen and evaluated today with no new acute complaints or concerns.  She continues to have a cough and was noted to be febrile overnight.  Objective: Vitals:   09/01/21 1854 09/01/21 2212 09/02/21 0550 09/02/21 0836  BP:  120/65 116/66 (!) 157/77  Pulse:  79 62 74  Resp:  18 18   Temp: (!) 101.7 F (38.7 C) 98.9 F (37.2 C) 98.7 F (37.1 C)   TempSrc: Oral Oral Oral   SpO2:  93% 93%   Weight:      Height:        Intake/Output Summary (Last 24 hours) at 09/02/2021 1128 Last data filed at 09/02/2021 0900 Gross per 24 hour  Intake 1235.75 ml  Output --  Net 1235.75 ml   Filed Weights   08/31/21 2101 09/01/21 0500 09/01/21 1445  Weight: 60.5 kg 61.2 kg 63.6 kg    Examination:  General exam: Appears calm and comfortable  Respiratory system: Clear to auscultation. Respiratory effort normal.  Currently on room air Cardiovascular system: S1 & S2 heard, RRR.  Gastrointestinal system: Abdomen is  soft Central nervous system: Alert and awake Extremities: No edema Skin: No significant lesions noted Psychiatry: Flat affect.    Data Reviewed: I have personally reviewed following labs and imaging studies  CBC: Recent Labs  Lab 08/31/21 1533 09/01/21 0422 09/02/21 0537  WBC 100.7* 109.0* 100.1*  NEUTROABS  --  58.9* 53.1*  HGB 7.9* 7.7* 7.6*  HCT 24.2* 23.6* 23.4*  MCV 92.7 92.9 93.2  PLT 77* 108* 665*   Basic Metabolic Panel: Recent Labs  Lab 08/27/21 0502 08/28/21 0514 08/31/21 1533 08/31/21 1547 09/01/21 0422 09/02/21 0537  NA 140  --  134*  --  142 142  K 3.5  --  3.8  --  3.7 4.2  CL 109  --  104  --  111 113*  CO2 24  --  25  --  26 25  GLUCOSE 98  --  109*  --  92 83  BUN 21  --  41*  --  36* 23  CREATININE 1.30*  --  2.34*  --  1.70* 1.26*  CALCIUM 8.5*  --  7.6*  --  7.9* 8.1*  MG 1.4* 1.9  --  2.0 1.7 1.6*  PHOS  --   --   --  4.2  --   --    GFR: Estimated Creatinine Clearance: 34.7 mL/min (A) (by C-G formula based on SCr of 1.26 mg/dL (H)). Liver Function Tests: Recent Labs  Lab 09/01/21 0422  AST 37  ALT 18  ALKPHOS 143*  BILITOT 0.9  PROT 6.3*  ALBUMIN 3.1*   No results for input(s): "LIPASE", "AMYLASE" in the last 168 hours. No results for input(s): "AMMONIA" in the last 168 hours. Coagulation Profile: Recent Labs  Lab 08/31/21 1547  INR 1.4*   Cardiac Enzymes: No results for input(s): "CKTOTAL", "CKMB", "CKMBINDEX", "TROPONINI" in the last 168 hours. BNP (last 3 results) No results for input(s): "PROBNP" in the last 8760 hours. HbA1C: No results for input(s): "HGBA1C" in the last 72 hours. CBG: Recent Labs  Lab 08/31/21 1518  GLUCAP 99   Lipid Profile: No results for input(s): "CHOL", "HDL", "LDLCALC", "TRIG", "CHOLHDL", "LDLDIRECT" in the last 72 hours. Thyroid Function Tests: Recent Labs    09/01/21 0422  TSH 1.158   Anemia Panel: No results for input(s): "VITAMINB12", "FOLATE", "FERRITIN", "TIBC", "IRON",  "RETICCTPCT" in the last 72 hours. Sepsis Labs: Recent Labs  Lab 08/31/21 1550 08/31/21 1950 09/02/21 0537  PROCALCITON 0.74  --  0.27  LATICACIDVEN 0.6 0.6  --     Recent Results (from the past 240 hour(s))  Blood Culture (routine x 2)     Status: None   Collection Time: 08/25/21 12:59 PM   Specimen: BLOOD  Result Value Ref Range Status   Specimen Description BLOOD  Final   Special Requests   Final    BOTTLES DRAWN AEROBIC ONLY BLOOD RIGHT HAND Blood Culture results may not be optimal due to an inadequate volume of blood received in culture bottles   Culture   Final    NO GROWTH 5 DAYS Performed at [facility], [address], [place], [place] [address]    Report Status 08/30/2021 FINAL  Final  Blood Culture (routine x 2)     Status: None (Preliminary result)   Collection Time: 08/25/21  1:01 PM   Specimen: Vein; Blood  Result Value Ref Range Status   Specimen Description PENDING  Incomplete   Special Requests   Final    BOTTLES DRAWN AEROBIC ONLY RIGHT ANTECUBITAL Blood Culture adequate volume   Culture   Final    NO GROWTH 5 DAYS Performed at [facility], [address] [place], [place] [address]    Report Status PENDING  Incomplete  Urine Culture     Status: Abnormal   Collection Time: 08/25/21  6:24 PM   Specimen: Urine, Clean Catch  Result Value Ref Range Status   Specimen Description   Final    URINE, CLEAN CATCH Performed at [facility], [address] [place], [place] [address]    Special Requests   Final    NONE Performed at [facility], [address], [place], [place] [address]    Culture 20,000 COLONIES/mL ESCHERICHIA COLI (A)  Final   Report Status 08/27/2021 FINAL  Final   Organism ID, Bacteria ESCHERICHIA COLI (A)  Final      Susceptibility   Escherichia coli - MIC*    AMPICILLIN <=2 SENSITIVE Sensitive     CEFAZOLIN <=4 SENSITIVE Sensitive     CEFEPIME <=0.12 SENSITIVE Sensitive     CEFTRIAXONE <=0.25 SENSITIVE Sensitive     CIPROFLOXACIN  <=  0.25 SENSITIVE Sensitive     GENTAMICIN <=1 SENSITIVE Sensitive     IMIPENEM <=0.25 SENSITIVE Sensitive     NITROFURANTOIN <=16 SENSITIVE Sensitive     TRIMETH/SULFA <=20 SENSITIVE Sensitive     AMPICILLIN/SULBACTAM <=2 SENSITIVE Sensitive     PIP/TAZO <=4 SENSITIVE Sensitive     * 20,000 COLONIES/mL ESCHERICHIA COLI  Blood culture (routine x 2)     Status: None (Preliminary result)   Collection Time: 08/31/21  3:49 PM   Specimen: BLOOD LEFT HAND  Result Value Ref Range Status   Specimen Description BLOOD LEFT HAND  Final   Special Requests   Final    Immunocompromised BOTTLES DRAWN AEROBIC ONLY Blood Culture results may not be optimal due to an inadequate volume of blood received in culture bottles   Culture   Final    NO GROWTH 2 DAYS Performed at [facility], [address], [place], [place] [address]    Report Status PENDING  Incomplete  Blood culture (routine x 2)     Status: None (Preliminary result)   Collection Time: 08/31/21  3:54 PM   Specimen: Site Not Specified; Blood  Result Value Ref Range Status   Specimen Description SITE NOT SPECIFIED  Final   Special Requests   Final    BOTTLES DRAWN AEROBIC ONLY Blood Culture results may not be optimal due to an inadequate volume of blood received in culture bottles   Culture   Final    NO GROWTH 2 DAYS Performed at [facility], [address], [place], [place] [address]    Report Status PENDING  Incomplete  MRSA Next Gen by PCR, Nasal     Status: None   Collection Time: 09/01/21  1:45 AM   Specimen: Nasal Mucosa; Nasal Swab  Result Value Ref Range Status   MRSA by PCR Next Gen NOT DETECTED NOT DETECTED Final    Comment: (NOTE) The GeneXpert MRSA Assay (FDA approved for NASAL specimens only), is one component of a comprehensive MRSA colonization surveillance program. It is not intended to diagnose MRSA infection nor to guide or monitor treatment for MRSA infections. Test performance is not FDA approved in patients less  than 82 years old. Performed at [facility], [address], [place],  [address]   SARS Coronavirus 2 by RT PCR (hospital order, performed in [facility] hospital lab) *cepheid single result test* Anterior Nasal Swab     Status: None   Collection Time: 09/01/21  7:50 AM   Specimen: Anterior Nasal Swab  Result Value Ref Range Status   SARS Coronavirus 2 by RT PCR NEGATIVE NEGATIVE Final    Comment: (NOTE) SARS-CoV-2 target nucleic acids are NOT DETECTED.  The SARS-CoV-2 RNA is generally detectable in upper and lower respiratory specimens during the acute phase of infection. The lowest concentration of SARS-CoV-2 viral copies this assay can detect is 250 copies / mL. A negative result does not preclude SARS-CoV-2 infection and should not be used as the sole basis for treatment or other patient management decisions.  A negative result may occur with improper specimen collection / handling, submission of specimen other than nasopharyngeal swab, presence of viral mutation(s) within the areas targeted by this assay, and inadequate number of viral copies (<250 copies / mL). A negative result must be combined with clinical observations, patient history, and epidemiological information.  Fact Sheet for Patients:   https://www.patel.info/  Fact Sheet for Healthcare Providers: https://hall.com/  This test is not yet approved or  cleared by  the [employer] and has been authorized for detection and/or diagnosis of SARS-CoV-2 by FDA under an Emergency Use Authorization (EUA).  This EUA will remain in effect (meaning this test can be used) for the duration of the COVID-19 declaration under Section 564(b)(1) of the Act, 21 U.S.C. section 360bbb-3(b)(1), unless the authorization is terminated or revoked sooner.  Performed at [facility], [address], [place], [place] [address]          Radiology Studies: CT CHEST WO  CONTRAST  Result Date: 09/01/2021 CLINICAL DATA:  Hematologic malignancy, monitor To evaluate for atypical infections in immunocompromised patient with cough EXAM: CT CHEST WITHOUT CONTRAST TECHNIQUE: Multidetector CT imaging of the chest was performed following the standard protocol without IV contrast. RADIATION DOSE REDUCTION: This exam was performed according to the departmental dose-optimization program which includes automated exposure control, adjustment of the mA and/or kV according to patient size and/or use of iterative reconstruction technique. COMPARISON:  Chest x-ray 08/25/2021, chest x-ray 09/01/2021 FINDINGS: Cardiovascular: Right chest wall port catheter tip terminating just distal to the superior cavoatrial junction. Normal heart size. No significant pericardial effusion. The thoracic aorta is normal in caliber. Mild atherosclerotic plaque of the thoracic aorta. No coronary artery calcifications. Mediastinum/Nodes: No gross hilar adenopathy, noting limited sensitivity for the detection of hilar adenopathy on this noncontrast study. No enlarged mediastinal or axillary lymph nodes. Thyroid gland, trachea, and esophagus demonstrate no significant findings. Lungs/Pleura: Mild to moderate paraseptal and centrilobular emphysematous changes. No focal consolidation. No pulmonary nodule. No pulmonary mass. Bilateral trace pleural effusions. No pneumothorax. Upper Abdomen: No acute abnormality. Musculoskeletal: No chest wall abnormality. No suspicious lytic or blastic osseous lesions. No acute displaced fracture. Multilevel degenerative changes of the spine. IMPRESSION: 1. Bilateral trace pleural effusions. 2. Aortic Atherosclerosis (ICD10-I70.0) and Emphysema (ICD10-J43.9). Electronically Signed   By: Iven Finn M.D.   On: 09/01/2021 22:52   DG CHEST PORT 1 VIEW  Result Date: 09/01/2021 CLINICAL DATA:  Acute kidney injury and hypotension.  SIRS. EXAM: PORTABLE CHEST 1 VIEW COMPARISON:  08/31/2021  FINDINGS: Power injectable right Port-A-Cath tip: Lower SVC. Atherosclerotic calcification of the aortic arch. Upper normal heart size. Cephalization of blood flow on this mostly upright image, raising the possibility of pulmonary venous hypertension. Linear subsegmental atelectasis or scarring along the left hemidiaphragm. IMPRESSION: 1. Cephalization of blood flow suggesting pulmonary venous hypertension. Upper normal heart size. 2. Subsegmental atelectasis along the left hemidiaphragm. 3.  Aortic Atherosclerosis (ICD10-I70.0). Electronically Signed   By: Van Clines M.D.   On: 09/01/2021 08:17   DG HIP UNILAT WITH PELVIS 2-3 VIEWS RIGHT  Result Date: 09/01/2021 CLINICAL DATA:  [place] 4 days ago with continued right hip pain. Right hip replacement 2012, revision 2014. EXAM: DG HIP (WITH OR WITHOUT PELVIS) 2-3V RIGHT COMPARISON:  Recent CT of abdomen and pelvis and reconstructions of 08/25/2021. FINDINGS: There is no evidence of hip fracture or dislocation. Right hip total joint arthroplasty appears well seated. Bone mineralization is osteopenic. There is mild arthrosis of the left hip joint. Both SI joints and pubic symphysis are unremarkable, as visualized. There are patchy calcifications in the right femoral artery. Artifact from overlying clothing. Soft tissues are otherwise unremarkable. IMPRESSION: Right hip arthroplasty.  Osteopenia without evidence of fractures. Electronically Signed   By: Telford Nab M.D.   On: 09/01/2021 05:33   CT HEAD WO CONTRAST (5MM)  Result Date: 09/01/2021 CLINICAL DATA:  Initial evaluation for head trauma. EXAM: CT HEAD WITHOUT CONTRAST TECHNIQUE: Contiguous axial  images were obtained from the base of the skull through the vertex without intravenous contrast. RADIATION DOSE REDUCTION: This exam was performed according to the departmental dose-optimization program which includes automated exposure control, adjustment of the mA and/or kV according to patient size  and/or use of iterative reconstruction technique. COMPARISON:  Prior CT from 01/22/2015. FINDINGS: Brain: Mild age-related cerebral atrophy. No acute intracranial hemorrhage. No acute large vessel territory infarct. No mass lesion, mass effect, or midline shift. No hydrocephalus or extra-axial fluid collection. Vascular: No hyperdense vessel. Skull: Scalp soft tissues demonstrate no acute finding. Calvarium intact. Osteopenia noted. Sinuses/Orbits: Globes and orbital soft tissues demonstrate no acute finding. Paranasal sinuses are largely clear. No mastoid effusion. Other: None. IMPRESSION: 1. No acute intracranial abnormality. 2. Mild age-related cerebral atrophy. Electronically Signed   By: Jeannine Boga M.D.   On: 09/01/2021 03:13   DG Chest Port 1 View  Result Date: 08/31/2021 CLINICAL DATA:  Hypotension, possible sepsis EXAM: PORTABLE CHEST 1 VIEW COMPARISON:  Portable exam 1559 hours compared to 08/25/2021 FINDINGS: RIGHT jugular Port-A-Cath with tip projecting over SVC. Upper normal heart size. Mediastinal contours and pulmonary vascularity normal. Lungs clear. No pulmonary infiltrate, pleural effusion, or pneumothorax. Bones demineralized. IMPRESSION: No acute abnormalities. Electronically Signed   By: Lavonia Dana M.D.   On: 08/31/2021 16:08        Scheduled Meds:  sodium chloride   Intravenous Once   allopurinol  300 mg Oral Daily   Chlorhexidine Gluconate Cloth  6 each Topical Daily   citalopram  20 mg Oral Daily   famotidine  20 mg Oral Daily   gabapentin  100 mg Oral QHS   heparin  5,000 Units Subcutaneous Q8H   losartan  25 mg Oral Daily   metoprolol tartrate  25 mg Oral BID   mirtazapine  15 mg Oral QHS   pantoprazole  40 mg Oral QAC breakfast   ruxolitinib phosphate  15 mg Oral Daily   Continuous Infusions:  ceFEPime (MAXIPIME) IV       LOS: 1 day    Time spent: 35 minutes    Renee Waters Darleen Crocker, DO [facility]  If 7PM-7AM, please contact  night-coverage [email or web address] 09/02/2021, 11:28 AM

## 2021-09-02 NOTE — Consult Note (Signed)
[facility] Oncology Progress Note  Name: Renee Waters      MRN: [id number]    Location: W098/J191-47  Date: 09/02/2021 Time:6:24 PM   Subjective: Interval History:Renee Waters is lying in bed this evening when I saw her.  She reports that she has been feeling better in terms of cough and shortness of breath today.  She is also eating better.  Objective: Vital signs in last 24 hours: Temp:  [98.3 F (36.8 C)-101.7 F (38.7 C)] 98.4 F (36.9 C) (07/27 1716) Pulse Rate:  [56-79] 56 (07/27 1716) Resp:  [16-26] 16 (07/27 1716) BP: (95-158)/(48-82) 96/54 (07/27 1716) SpO2:  [93 %-96 %] 96 % (07/27 1716)    Intake/Output from previous day: 07/26 0800 - 07/27 0759 In: 1415.8 [P.O.:820; I.V.:495.8] Out: 300 [Urine:300]    Intake/Output this shift: Total I/O In: 781.6 [P.O.:360; Blood:386; IV Piggyback:35.6] Out: -    PHYSICAL EXAM: BP (!) 96/54 (BP Location: Left Arm)   Pulse (!) 56   Temp 98.4 F (36.9 C)   Resp 16   Ht '5\' 5"'$  (1.651 m)   Wt 140 lb 3.4 oz (63.6 kg)   SpO2 96%   BMI 23.33 kg/m  General appearance: alert, cooperative, and appears stated age Neurologic: Grossly normal   Studies/Results: Results for orders placed or performed during the hospital encounter of 08/31/21 (from the past 48 hour(s))  Troponin I (High Sensitivity)     Status: None   Collection Time: 08/31/21  7:38 PM  Result Value Ref Range   Troponin I (High Sensitivity) 8 <18 ng/L    Comment: (NOTE) Elevated high sensitivity troponin I (hsTnI) values and significant  changes across serial measurements may suggest ACS but many other  chronic and acute conditions are known to elevate hsTnI results.  Refer to the "Links" section for chest pain algorithms and additional  guidance. Performed at [facility], [address] [place], [place] [address]   Lactic acid, plasma     Status: None   Collection Time: 08/31/21  7:50 PM  Result Value Ref Range   Lactic Acid, Venous 0.6 0.5 -  1.9 mmol/L    Comment: Performed at [facility], [address] [place], [place] [address]  MRSA Next Gen by PCR, Nasal     Status: None   Collection Time: 09/01/21  1:45 AM   Specimen: Nasal Mucosa; Nasal Swab  Result Value Ref Range   MRSA by PCR Next Gen NOT DETECTED NOT DETECTED    Comment: (NOTE) The GeneXpert MRSA Assay (FDA approved for NASAL specimens only), is one component of a comprehensive MRSA colonization surveillance program. It is not intended to diagnose MRSA infection nor to guide or monitor treatment for MRSA infections. Test performance is not FDA approved in patients less than 73 years old. Performed at [facility], [address] [place], [place] [address]   Comprehensive metabolic panel     Status: Abnormal   Collection Time: 09/01/21  4:22 AM  Result Value Ref Range   Sodium 142 135 - 145 mmol/L    Comment: DELTA CHECK NOTED   Potassium 3.7 3.5 - 5.1 mmol/L   Chloride 111 98 - 111 mmol/L   CO2 26 22 - 32 mmol/L   Glucose, Bld 92 70 - 99 mg/dL    Comment: Glucose reference range applies only to samples taken after fasting for at least 8 hours.   BUN 36 (H) 8 - 23 mg/dL   Creatinine, Ser 1.70 (H) 0.44 - 1.00  mg/dL   Calcium 7.9 (L) 8.9 - 10.3 mg/dL   Total Protein 6.3 (L) 6.5 - 8.1 g/dL   Albumin 3.1 (L) 3.5 - 5.0 g/dL   AST 37 15 - 41 U/L   ALT 18 0 - 44 U/L   Alkaline Phosphatase 143 (H) 38 - 126 U/L   Total Bilirubin 0.9 0.3 - 1.2 mg/dL   GFR, Estimated 31 (L) >60 mL/min    Comment: (NOTE) Calculated using the CKD-EPI Creatinine Equation (2021)    Anion gap 5 5 - 15    Comment: Performed at [facility], [address] [place], [place] [address]  Magnesium     Status: None   Collection Time: 09/01/21  4:22 AM  Result Value Ref Range   Magnesium 1.7 1.7 - 2.4 mg/dL    Comment: Performed at [facility], [address], [place], [place] [address]  CBC with Differential/Platelet     Status: Abnormal   Collection Time: 09/01/21  4:22 AM  Result  Value Ref Range   WBC 109.0 (HH) 4.0 - 10.5 K/uL    Comment: WHITE COUNT CONFIRMED ON SMEAR THIS CRITICAL RESULT HAS VERIFIED AND BEEN CALLED TO TINAJERO,E BY BOBBIE MATTHEWS ON 07 26 2023 AT 0612, AND HAS BEEN READ BACK.     RBC 2.54 (L) 3.87 - 5.11 MIL/uL   Hemoglobin 7.7 (L) 12.0 - 15.0 g/dL   HCT 23.6 (L) 36.0 - 46.0 %   MCV 92.9 80.0 - 100.0 fL   MCH 30.3 26.0 - 34.0 pg   MCHC 32.6 30.0 - 36.0 g/dL   RDW 21.7 (H) 11.5 - 15.5 %   Platelets 108 (L) 150 - 400 K/uL    Comment: SPECIMEN CHECKED FOR CLOTS Immature Platelet Fraction may be clinically indicated, consider ordering this additional test [id number] PLATELET COUNT CONFIRMED BY SMEAR    nRBC 3.5 (H) 0.0 - 0.2 %   Neutrophils Relative % 42 %   Neutro Abs 58.9 (H) 1.7 - 7.7 K/uL   Band Neutrophils 12 %   Lymphocytes Relative 13 %   Lymphs Abs 14.2 (H) 0.7 - 4.0 K/uL   Monocytes Relative 3 %   Monocytes Absolute 3.3 (H) 0.1 - 1.0 K/uL   Eosinophils Relative 0 %   Eosinophils Absolute 0.0 0.0 - 0.5 K/uL   Basophils Relative 1 %   Basophils Absolute 1.1 (H) 0.0 - 0.1 K/uL   WBC Morphology BLASTS PRESENT    RBC Morphology See Note     Comment: NUCLEATED RBCS PRESENT   nRBC 5 (H) 0 /100 WBC   Metamyelocytes Relative 6 %   Myelocytes 11 %   Promyelocytes Relative 6 %   Blasts 6 %   Abs Immature Granulocytes 25.10 (H) 0.00 - 0.07 K/uL   Reactive, Benign Lymphocytes PRESENT    Polychromasia PRESENT     Comment: Performed at [facility], [address] [place], [place] [address]  TSH     Status: None   Collection Time: 09/01/21  4:22 AM  Result Value Ref Range   TSH 1.158 0.350 - 4.500 uIU/mL    Comment: Performed by a 3rd Generation assay with a functional sensitivity of <=0.01 uIU/mL. Performed at [facility], [address] [place],  [address]   SARS Coronavirus 2 by RT PCR (hospital order, performed in [facility] hospital lab) *cepheid single result test* Anterior Nasal Swab     Status: None   Collection  Time: 09/01/21  7:50 AM   Specimen: Anterior Nasal  Swab  Result Value Ref Range   SARS Coronavirus 2 by RT PCR NEGATIVE NEGATIVE    Comment: (NOTE) SARS-CoV-2 target nucleic acids are NOT DETECTED.  The SARS-CoV-2 RNA is generally detectable in upper and lower respiratory specimens during the acute phase of infection. The lowest concentration of SARS-CoV-2 viral copies this assay can detect is 250 copies / mL. A negative result does not preclude SARS-CoV-2 infection and should not be used as the sole basis for treatment or other patient management decisions.  A negative result may occur with improper specimen collection / handling, submission of specimen other than nasopharyngeal swab, presence of viral mutation(s) within the areas targeted by this assay, and inadequate number of viral copies (<250 copies / mL). A negative result must be combined with clinical observations, patient history, and epidemiological information.  Fact Sheet for Patients:   https://www.patel.info/  Fact Sheet for Healthcare Providers: https://hall.com/  This test is not yet approved or  cleared by the [place] FDA and has been authorized for detection and/or diagnosis of SARS-CoV-2 by FDA under an Emergency Use Authorization (EUA).  This EUA will remain in effect (meaning this test can be used) for the duration of the COVID-19 declaration under Section 564(b)(1) of the Act, 21 U.S.C. section 360bbb-3(b)(1), unless the authorization is terminated or revoked sooner.  Performed at [facility], [address] [place], [place] [address]   CBC with Differential/Platelet     Status: Abnormal   Collection Time: 09/02/21  5:37 AM  Result Value Ref Range   WBC 100.1 (HH) 4.0 - 10.5 K/uL    Comment: CRITICAL VALUE NOTED.  VALUE IS CONSISTENT WITH PREVIOUSLY REPORTED AND CALLED VALUE. REPEATED TO VERIFY THIS CRITICAL RESULT HAS VERIFIED AND BEEN CALLED TO FAURIE,C  BY BOBBIE MATTHEWS ON 07 27 2023 AT 0654, AND HAS BEEN READ BACK.     RBC 2.51 (L) 3.87 - 5.11 MIL/uL   Hemoglobin 7.6 (L) 12.0 - 15.0 g/dL   HCT 23.4 (L) 36.0 - 46.0 %   MCV 93.2 80.0 - 100.0 fL   MCH 30.3 26.0 - 34.0 pg   MCHC 32.5 30.0 - 36.0 g/dL   RDW 22.5 (H) 11.5 - 15.5 %   Platelets 132 (L) 150 - 400 K/uL   nRBC 3.1 (H) 0.0 - 0.2 %   Neutrophils Relative % 47 %   Neutro Abs 53.1 (H) 1.7 - 7.7 K/uL   Band Neutrophils 6 %   Lymphocytes Relative 8 %   Lymphs Abs 8.0 (H) 0.7 - 4.0 K/uL   Monocytes Relative 9 %   Monocytes Absolute 9.0 (H) 0.1 - 1.0 K/uL   Eosinophils Relative 0 %   Eosinophils Absolute 0.0 0.0 - 0.5 K/uL   Basophils Relative 0 %   Basophils Absolute 0.0 0.0 - 0.1 K/uL   WBC Morphology      MARKED LEFT SHIFT (>5% METAS,MYELOS AND PROS, OCC BLAST NOTED)    Comment: TOXIC GRANULATION   RBC Morphology See Note     Comment: ANISOCYTOSIS PRESENT, MICROCYTES PRESENT   Smear Review MORPHOLOGY UNREMARKABLE     Comment: PLATELET COUNT CONFIRMED BY SMEAR   nRBC 6 (H) 0 /100 WBC   Metamyelocytes Relative 7 %   Myelocytes 14 %   Promyelocytes Relative 6 %   Blasts 3 %   Abs Immature Granulocytes 27.00 (H) 0.00 - 0.07 K/uL   Reactive, Benign Lymphocytes PRESENT    Polychromasia PRESENT     Comment: Performed at [facility],  [place], [place] [address]  Basic metabolic panel     Status: Abnormal   Collection Time: 09/02/21  5:37 AM  Result Value Ref Range   Sodium 142 135 - 145 mmol/L   Potassium 4.2 3.5 - 5.1 mmol/L   Chloride 113 (H) 98 - 111 mmol/L   CO2 25 22 - 32 mmol/L   Glucose, Bld 83 70 - 99 mg/dL    Comment: Glucose reference range applies only to samples taken after fasting for at least 8 hours.   BUN 23 8 - 23 mg/dL   Creatinine, Ser 1.26 (H) 0.44 - 1.00 mg/dL   Calcium 8.1 (L) 8.9 - 10.3 mg/dL   GFR, Estimated 45 (L) >60 mL/min    Comment: (NOTE) Calculated using the CKD-EPI Creatinine Equation (2021)    Anion gap 4 (L) 5 - 15     Comment: Performed at [facility], [address], [place], [place] [address]  Magnesium     Status: Abnormal   Collection Time: 09/02/21  5:37 AM  Result Value Ref Range   Magnesium 1.6 (L) 1.7 - 2.4 mg/dL    Comment: Performed at [facility], [address], [place], [place] [address]  Procalcitonin - Baseline     Status: None   Collection Time: 09/02/21  5:37 AM  Result Value Ref Range   Procalcitonin 0.27 ng/mL    Comment:        Interpretation: PCT (Procalcitonin) <= 0.5 ng/mL: Systemic infection (sepsis) is not likely. Local bacterial infection is possible. (NOTE)       Sepsis PCT Algorithm           Lower Respiratory Tract                                      Infection PCT Algorithm    ----------------------------     ----------------------------         PCT < 0.25 ng/mL                PCT < 0.10 ng/mL          Strongly encourage             Strongly discourage   discontinuation of antibiotics    initiation of antibiotics    ----------------------------     -----------------------------       PCT 0.25 - 0.50 ng/mL            PCT 0.10 - 0.25 ng/mL               OR       >80% decrease in PCT            Discourage initiation of                                            antibiotics      Encourage discontinuation           of antibiotics    ----------------------------     -----------------------------         PCT >= 0.50 ng/mL              PCT 0.26 - 0.50 ng/mL               AND        <  80% decrease in PCT             Encourage initiation of                                             antibiotics       Encourage continuation           of antibiotics    ----------------------------     -----------------------------        PCT >= 0.50 ng/mL                  PCT > 0.50 ng/mL               AND         increase in PCT                  Strongly encourage                                      initiation of antibiotics    Strongly encourage escalation           of antibiotics                                      -----------------------------                                           PCT <= 0.25 ng/mL                                                 OR                                        > 80% decrease in PCT                                      Discontinue / Do not initiate                                             antibiotics  Performed at [facility], [address], [place], [place] [address]   Respiratory (~20 pathogens) panel by PCR     Status: None   Collection Time: 09/02/21  6:58 AM   Specimen: Nasopharyngeal Swab; Respiratory  Result Value Ref Range   Adenovirus NOT DETECTED NOT DETECTED   Coronavirus 229E NOT DETECTED NOT DETECTED    Comment: (NOTE) The Coronavirus on the Respiratory Panel, DOES NOT test for the novel  Coronavirus (2019 nCoV)    Coronavirus HKU1 NOT DETECTED NOT DETECTED   Coronavirus NL63 NOT DETECTED NOT DETECTED   Coronavirus OC43 NOT DETECTED NOT DETECTED  Metapneumovirus NOT DETECTED NOT DETECTED   Rhinovirus / Enterovirus NOT DETECTED NOT DETECTED   Influenza A NOT DETECTED NOT DETECTED   Influenza B NOT DETECTED NOT DETECTED   Parainfluenza Virus 1 NOT DETECTED NOT DETECTED   Parainfluenza Virus 2 NOT DETECTED NOT DETECTED   Parainfluenza Virus 3 NOT DETECTED NOT DETECTED   Parainfluenza Virus 4 NOT DETECTED NOT DETECTED   Respiratory Syncytial Virus NOT DETECTED NOT DETECTED   Bordetella pertussis NOT DETECTED NOT DETECTED   Bordetella Parapertussis NOT DETECTED NOT DETECTED   Chlamydophila pneumoniae NOT DETECTED NOT DETECTED   Mycoplasma pneumoniae NOT DETECTED NOT DETECTED    Comment: Performed at [facility], [place] [address], [place], [place] [address]  Urinalysis, Routine w reflex microscopic Urine, Clean Catch     Status: Abnormal   Collection Time: 09/02/21  7:00 AM  Result Value Ref Range   Color, Urine YELLOW YELLOW   APPearance CLEAR CLEAR   Specific Gravity, Urine 1.013 1.005 - 1.030   pH  6.0 5.0 - 8.0   Glucose, UA NEGATIVE NEGATIVE mg/dL   Hgb urine dipstick SMALL (A) NEGATIVE   Bilirubin Urine NEGATIVE NEGATIVE   Ketones, ur NEGATIVE NEGATIVE mg/dL   Protein, ur 30 (A) NEGATIVE mg/dL   Nitrite NEGATIVE NEGATIVE   Leukocytes,Ua TRACE (A) NEGATIVE   RBC / HPF 0-5 0 - 5 RBC/hpf   WBC, UA 0-5 0 - 5 WBC/hpf   Bacteria, UA RARE (A) NONE SEEN   Squamous Epithelial / LPF 0-5 0 - 5   Mucus PRESENT     Comment: Performed at [facility], [address], [place], [place] [address]  Strep pneumoniae urinary antigen     Status: None   Collection Time: 09/02/21  7:01 AM  Result Value Ref Range   Strep Pneumo Urinary Antigen NEGATIVE NEGATIVE    Comment:        Infection due to S. pneumoniae cannot be absolutely ruled out since the antigen present may be below the detection limit of the test. Performed at [facility], [address] [place], [place] [address]   Brain natriuretic peptide     Status: Abnormal   Collection Time: 09/02/21  4:22 PM  Result Value Ref Range   B Natriuretic Peptide 569.0 (H) 0.0 - 100.0 pg/mL    Comment: Performed at [facility], [address], [place], [place] [address]   DG CHEST PORT 1 VIEW  Result Date: 09/02/2021 CLINICAL DATA:  Hypotension EXAM: PORTABLE CHEST 1 VIEW COMPARISON:  Previous studies including the examination of 09/01/2021 FINDINGS: Transverse diameter heart is increased. Thoracic aorta is tortuous and ectatic. Central pulmonary vessels are slightly less prominent. There are no signs of alveolar pulmonary edema. Small patchy infiltrate is seen in the lower lung fields. Costophrenic angles are clear. There is faint air soft tissue interface in the lateral aspect of right mid lung field. This may be an artifact due to skin fold. Possibility of small pneumothorax is not excluded. Tip of right IJ chest port is seen in superior vena cava. IMPRESSION: There is faint area soft tissue interface in the lateral aspect of right mid lung  field, possibly an artifact due to skin fold. Less likely possibility would be small right pneumothorax. Repeat chest radiographs should be considered. Cardiomegaly. Central pulmonary vessels are slightly less prominent. There are no signs of alveolar pulmonary edema or focal pulmonary consolidation. There are small linear densities in both lower lung fields suggesting subsegmental atelectasis. Electronically  Signed   By: Elmer Picker M.D.   On: 09/02/2021 16:08   CT CHEST WO CONTRAST  Result Date: 09/01/2021 CLINICAL DATA:  Hematologic malignancy, monitor To evaluate for atypical infections in immunocompromised patient with cough EXAM: CT CHEST WITHOUT CONTRAST TECHNIQUE: Multidetector CT imaging of the chest was performed following the standard protocol without IV contrast. RADIATION DOSE REDUCTION: This exam was performed according to the departmental dose-optimization program which includes automated exposure control, adjustment of the mA and/or kV according to patient size and/or use of iterative reconstruction technique. COMPARISON:  Chest x-ray 08/25/2021, chest x-ray 09/01/2021 FINDINGS: Cardiovascular: Right chest wall port catheter tip terminating just distal to the superior cavoatrial junction. Normal heart size. No significant pericardial effusion. The thoracic aorta is normal in caliber. Mild atherosclerotic plaque of the thoracic aorta. No coronary artery calcifications. Mediastinum/Nodes: No gross hilar adenopathy, noting limited sensitivity for the detection of hilar adenopathy on this noncontrast study. No enlarged mediastinal or axillary lymph nodes. Thyroid gland, trachea, and esophagus demonstrate no significant findings. Lungs/Pleura: Mild to moderate paraseptal and centrilobular emphysematous changes. No focal consolidation. No pulmonary nodule. No pulmonary mass. Bilateral trace pleural effusions. No pneumothorax. Upper Abdomen: No acute abnormality. Musculoskeletal: No chest wall  abnormality. No suspicious lytic or blastic osseous lesions. No acute displaced fracture. Multilevel degenerative changes of the spine. IMPRESSION: 1. Bilateral trace pleural effusions. 2. Aortic Atherosclerosis (ICD10-I70.0) and Emphysema (ICD10-J43.9). Electronically Signed   By: Iven Finn M.D.   On: 09/01/2021 22:52   DG CHEST PORT 1 VIEW  Result Date: 09/01/2021 CLINICAL DATA:  Acute kidney injury and hypotension.  SIRS. EXAM: PORTABLE CHEST 1 VIEW COMPARISON:  08/31/2021 FINDINGS: Power injectable right Port-A-Cath tip: Lower SVC. Atherosclerotic calcification of the aortic arch. Upper normal heart size. Cephalization of blood flow on this mostly upright image, raising the possibility of pulmonary venous hypertension. Linear subsegmental atelectasis or scarring along the left hemidiaphragm. IMPRESSION: 1. Cephalization of blood flow suggesting pulmonary venous hypertension. Upper normal heart size. 2. Subsegmental atelectasis along the left hemidiaphragm. 3.  Aortic Atherosclerosis (ICD10-I70.0). Electronically Signed   By: Van Clines M.D.   On: 09/01/2021 08:17   DG HIP UNILAT WITH PELVIS 2-3 VIEWS RIGHT  Result Date: 09/01/2021 CLINICAL DATA:  [place] 4 days ago with continued right hip pain. Right hip replacement 2012, revision 2014. EXAM: DG HIP (WITH OR WITHOUT PELVIS) 2-3V RIGHT COMPARISON:  Recent CT of abdomen and pelvis and reconstructions of 08/25/2021. FINDINGS: There is no evidence of hip fracture or dislocation. Right hip total joint arthroplasty appears well seated. Bone mineralization is osteopenic. There is mild arthrosis of the left hip joint. Both SI joints and pubic symphysis are unremarkable, as visualized. There are patchy calcifications in the right femoral artery. Artifact from overlying clothing. Soft tissues are otherwise unremarkable. IMPRESSION: Right hip arthroplasty.  Osteopenia without evidence of fractures. Electronically Signed   By: Telford Nab M.D.   On:  09/01/2021 05:33   CT HEAD WO CONTRAST (5MM)  Result Date: 09/01/2021 CLINICAL DATA:  Initial evaluation for head trauma. EXAM: CT HEAD WITHOUT CONTRAST TECHNIQUE: Contiguous axial images were obtained from the base of the skull through the vertex without intravenous contrast. RADIATION DOSE REDUCTION: This exam was performed according to the departmental dose-optimization program which includes automated exposure control, adjustment of the mA and/or kV according to patient size and/or use of iterative reconstruction technique. COMPARISON:  Prior CT from 01/22/2015. FINDINGS: Brain: Mild age-related cerebral atrophy. No acute intracranial hemorrhage. No acute large  vessel territory infarct. No mass lesion, mass effect, or midline shift. No hydrocephalus or extra-axial fluid collection. Vascular: No hyperdense vessel. Skull: Scalp soft tissues demonstrate no acute finding. Calvarium intact. Osteopenia noted. Sinuses/Orbits: Globes and orbital soft tissues demonstrate no acute finding. Paranasal sinuses are largely clear. No mastoid effusion. Other: None. IMPRESSION: 1. No acute intracranial abnormality. 2. Mild age-related cerebral atrophy. Electronically Signed   By: Jeannine Boga M.D.   On: 09/01/2021 03:13     MEDICATIONS: I have reviewed the patient's current medications.     Assessment/Plan:  1.  Myeloproliferative neoplasm: - Jakafi decreased to 15 mg once daily. - White blood count is stable at 100 K.  Hemoglobin is 7.6. - She received 1 unit PRBC today.  2.  SIRS: - Reviewed CT chest without contrast which showed bilateral trace pleural effusions with no evidence of infection. - She is having low-grade fevers. - Cultures are negative.  Continue cefepime. - Respiratory panel was ordered.  If continues to have fevers, will obtain ID input.  All questions were answered. The patient knows to call the clinic with any problems, questions or concerns. We can certainly see the patient  much sooner if necessary.    Derek Jack

## 2021-09-02 NOTE — Progress Notes (Signed)
Patient completed blood transfusion, post vitals: T-98.4, P-57, BP-95/48, R-26, Oxygen 93% at RA. Noted patient had increased restlessness, while resting oxygen saturation dropped to 87%, placed patient on 2 liters. Patient was given PRN spasm and pain medication during transfusion due to complaints of spasms and pain, see MAR. MD Manuella Ghazi made aware. New orders placed, obtain BNP and DG chest.

## 2021-09-02 NOTE — Progress Notes (Signed)
Patient alert and verbal, ambulated to [facility] with assist. Patient and family reported that patient has not been eating or sleeping well lately. Patient ate several times during shift both hospital meals and meals brought from family. Patient alert vital signs stable. Noted patient had small minimal amount of blood noted to tissues earlier in shift. MD Manuella Ghazi aware. New orders placed. Patients oxygen saturation now 95% at Room air, patient verbalized no complaints

## 2021-09-02 NOTE — Plan of Care (Signed)
  Problem: Education: Goal: Knowledge of General Education information will improve Description: Including pain rating scale, medication(s)/side effects and non-pharmacologic comfort measures Outcome: Progressing   Problem: Nutrition: Goal: Adequate nutrition will be maintained Outcome: Progressing   Problem: Coping: Goal: Level of anxiety will decrease Outcome: Progressing   Problem: Pain Managment: Goal: General experience of comfort will improve Outcome: Progressing   Problem: Safety: Goal: Ability to remain free from injury will improve Outcome: Progressing   Problem: Respiratory: Goal: Ability to maintain adequate ventilation will improve Outcome: Progressing

## 2021-09-03 DIAGNOSIS — N179 Acute kidney failure, unspecified: Secondary | ICD-10-CM | POA: Diagnosis not present

## 2021-09-03 LAB — URINALYSIS, ROUTINE W REFLEX MICROSCOPIC
Bacteria, UA: NONE SEEN
Bilirubin Urine: NEGATIVE
Glucose, UA: NEGATIVE mg/dL
Ketones, ur: NEGATIVE mg/dL
Leukocytes,Ua: NEGATIVE
Nitrite: NEGATIVE
Protein, ur: 30 mg/dL — AB
Specific Gravity, Urine: 1.01 (ref 1.005–1.030)
pH: 7 (ref 5.0–8.0)

## 2021-09-03 LAB — BASIC METABOLIC PANEL
Anion gap: 5 (ref 5–15)
BUN: 23 mg/dL (ref 8–23)
CO2: 26 mmol/L (ref 22–32)
Calcium: 8.4 mg/dL — ABNORMAL LOW (ref 8.9–10.3)
Chloride: 110 mmol/L (ref 98–111)
Creatinine, Ser: 1.14 mg/dL — ABNORMAL HIGH (ref 0.44–1.00)
GFR, Estimated: 50 mL/min — ABNORMAL LOW (ref >60)
Glucose, Bld: 95 mg/dL (ref 70–99)
Potassium: 3.6 mmol/L (ref 3.5–5.1)
Sodium: 141 mmol/L (ref 135–145)

## 2021-09-03 LAB — CBC
HCT: 27.6 % — ABNORMAL LOW (ref 36.0–46.0)
Hemoglobin: 9.1 g/dL — ABNORMAL LOW (ref 12.0–15.0)
MCH: 30.3 pg (ref 26.0–34.0)
MCHC: 33 g/dL (ref 30.0–36.0)
MCV: 92 fL (ref 80.0–100.0)
Platelets: 157 10*3/uL (ref 150–400)
RBC: 3 MIL/uL — ABNORMAL LOW (ref 3.87–5.11)
RDW: 20.7 % — ABNORMAL HIGH (ref 11.5–15.5)
WBC: 114.1 10*3/uL (ref 4.0–10.5)
nRBC: 3.3 % — ABNORMAL HIGH (ref 0.0–0.2)

## 2021-09-03 LAB — MAGNESIUM: Magnesium: 1.8 mg/dL (ref 1.7–2.4)

## 2021-09-03 LAB — LEGIONELLA PNEUMOPHILA SEROGP 1 UR AG: L. pneumophila Serogp 1 Ur Ag: NEGATIVE

## 2021-09-03 MED ORDER — RUXOLITINIB PHOSPHATE 15 MG PO TABS: 15.0000 mg | ORAL_TABLET | Freq: Every day | ORAL | 3 refills | Status: DC

## 2021-09-03 MED ORDER — HEPARIN SOD (PORK) LOCK FLUSH 100 UNIT/ML IV SOLN
500.0000 [IU] | Freq: Once | INTRAVENOUS | Status: AC
  Administered 2021-09-03: 500 [IU] via INTRAVENOUS
  Filled 2021-09-03: qty 5

## 2021-09-03 MED ORDER — AMOXICILLIN-POT CLAVULANATE 500-125 MG PO TABS: 1.0000 | ORAL_TABLET | Freq: Three times a day (TID) | ORAL | 0 refills | Status: AC

## 2021-09-03 NOTE — Progress Notes (Signed)
Patient c/o urine developing an odor and request that I notify the MD. Dr. Manuella Ghazi and oncoming nurse made aware for follow-up.

## 2021-09-03 NOTE — Discharge Summary (Signed)
Physician Discharge Summary  Renee Waters BDZ:[id number] DOB: December 08, 1945 DOA: 08/31/2021  PCP: [name], MD  Admit date: 08/31/2021  Discharge date: 09/03/2021  Admitted From:Home  Disposition:  Home  Recommendations for Outpatient Follow-up:  Follow up with PCP in 1-2 weeks Follow-up with [name] scheduled on 7/31 and remain on Jakafi once daily as ordered Remain on Augmentin as prescribed for 3 more days to complete total 5-day course of treatment for suspected pneumonia Blood and urine cultures negative Continue other home medications as prior  Home Health: None  Equipment/Devices: None  Discharge Condition:Stable  CODE STATUS: DNR  Diet recommendation: Heart Healthy  Brief/Interim Summary: Renee Waters is a 76 y.o. female with medical history significant of acute cholangitis, allergies, anemia, Barrett's esophagus, CKD, GERD,, hypertension, history of blood transfusion with 2 units at the last admission, systemic lupus, myeloproliferative neoplasm and more presents the ED with a chief complaint of hypotension.  Her blood pressures have stabilized with the use of IV fluid and she was noted to have some poor p.o. intake and was admitted for AKI.  She was also thought to have some pneumonia and was started on vancomycin and cefepime and was transitioned to cefepime only and has completed at least 2 days worth of IV treatments with improvement in clinical condition and blood pressures.  She was seen by her oncologist [name] with recommendation of 1 unit PRBC transfusion which was transfused on 7/27.  She was also transition to once daily Jakafi and will be followed up outpatient on 7/31.  Respiratory viral panel without any significant findings and CT chest otherwise negative.  She was complaining of some symptoms of UTI prior to discharge, but urine analysis has returned negative.  She is eager for discharge today and is otherwise stable and will remain on  oral antibiotics as noted above with adjustment to her Jakafi.  No other acute events noted.  Discharge Diagnoses:  Principal Problem:   AKI (acute kidney injury) ([place]) Active Problems:   Depression   Essential hypertension   GERD (gastroesophageal reflux disease)   Anxiety   MPN (myeloproliferative neoplasm) (H[facility])   SIRS (systemic inflammatory response syndrome) (H[facility])   Acute respiratory failure with hypoxia ([place])   Fall at home, initial encounter   Hypotension  Principal discharge diagnosis: Questionable community-acquired pneumonia with AKI and hypotension.  Acute neoplastic anemia status post 1 unit PRBC transfusion.  Discharge Instructions  Discharge Instructions     Diet - low sodium heart healthy   Complete by: As directed    Increase activity slowly   Complete by: As directed       Allergies as of 09/03/2021       Reactions   Bee Venom Swelling, Hives   Acetaminophen    itches   Amlodipine    Hair loss   Tyloxapol    Other Itching, Rash, Swelling   Oxycodone-acetaminophen Rash   Pt states, "this gives her a rash, but at home she takes oxycodone for pain relief" Pt states, "this gives her a rash, but at home she takes oxycodone for pain relief"        Medication List     TAKE these medications    acyclovir 800 MG tablet Commonly known as: ZOVIRAX Take 1 tablet (800 mg total) by mouth 5 (five) times daily. What changed:  how much to take when to take this   acyclovir ointment 5 % Commonly known as: ZOVIRAX Apply 1 application. topically every 3 (three) hours.  allopurinol 300 MG tablet Commonly known as: ZYLOPRIM Take 1 tablet by mouth once daily   amoxicillin-clavulanate 500-125 MG tablet Commonly known as: Augmentin Take 1 tablet (500 mg total) by mouth 3 (three) times daily for 3 days.   citalopram 20 MG tablet Commonly known as: CELEXA Take 1 tablet by mouth once daily   EPINEPHrine 0.3 mg/0.3 mL Soaj injection Commonly known as:  EPI-PEN INJECT 0.3 MLS INTO MUSCLE ONCE AS NEEDED What changed: See the new instructions.   gabapentin 300 MG capsule Commonly known as: NEURONTIN Take one capsule by mouth once daily, as needed, for pain   lidocaine-prilocaine cream Commonly known as: EMLA Apply 1 application. topically as needed.   losartan 25 MG tablet Commonly known as: COZAAR Take 1 tablet by mouth once daily for blood pressure   meloxicam 7.5 MG tablet Commonly known as: MOBIC Take 1 tablet (7.5 mg total) by mouth daily as needed for pain (knee pain).   metoprolol tartrate 25 MG tablet Commonly known as: LOPRESSOR Take 1 tablet (25 mg total) by mouth 2 (two) times daily.   mirtazapine 15 MG tablet Commonly known as: REMERON Take 1 tablet (15 mg total) by mouth at bedtime.   oxyCODONE 5 MG immediate release tablet Commonly known as: Oxy IR/ROXICODONE Take 5 mg by mouth every 4 (four) hours as needed.   pantoprazole 40 MG tablet Commonly known as: PROTONIX TAKE 1 TABLET BY MOUTH ONCE DAILY BEFORE BREAKFAST   potassium chloride 10 MEQ tablet Commonly known as: KLOR-CON Take 1 tablet by mouth once daily   ruxolitinib phosphate 15 MG tablet Commonly known as: JAKAFI Take 1 tablet (15 mg total) by mouth daily. What changed: when to take this   temazepam 30 MG capsule Commonly known as: Restoril Take 1 capsule (30 mg total) by mouth at bedtime as needed for sleep.   tiZANidine 4 MG tablet Commonly known as: ZANAFLEX Take 8 mg by mouth 3 (three) times daily as needed (spasms).        Follow-up Information     [name], MD. Schedule an appointment as soon as possible for a visit in 1 week(s).   Specialty: Family Medicine Contact information: [address], [address] [place] [address] [phone]         Derek Jack, MD. Go to.   Specialty: Hematology Contact information: [place] [address] [phone]                Allergies   Allergen Reactions   Bee Venom Swelling and Hives   Acetaminophen     itches   Amlodipine     Hair loss   Tyloxapol    Other Itching, Rash and Swelling   Oxycodone-Acetaminophen Rash    Pt states, "this gives her a rash, but at home she takes oxycodone for pain relief" Pt states, "this gives her a rash, but at home she takes oxycodone for pain relief"    Consultations: Oncology   Procedures/Studies: DG CHEST PORT 1 VIEW  Result Date: 09/02/2021 CLINICAL DATA:  Hypotension EXAM: PORTABLE CHEST 1 VIEW COMPARISON:  Previous studies including the examination of 09/01/2021 FINDINGS: Transverse diameter heart is increased. Thoracic aorta is tortuous and ectatic. Central pulmonary vessels are slightly less prominent. There are no signs of alveolar pulmonary edema. Small patchy infiltrate is seen in the lower lung fields. Costophrenic angles are clear. There is faint air soft tissue interface in the lateral aspect of right mid lung field.  This may be an artifact due to skin fold. Possibility of small pneumothorax is not excluded. Tip of right IJ chest port is seen in superior vena cava. IMPRESSION: There is faint area soft tissue interface in the lateral aspect of right mid lung field, possibly an artifact due to skin fold. Less likely possibility would be small right pneumothorax. Repeat chest radiographs should be considered. Cardiomegaly. Central pulmonary vessels are slightly less prominent. There are no signs of alveolar pulmonary edema or focal pulmonary consolidation. There are small linear densities in both lower lung fields suggesting subsegmental atelectasis. Electronically Signed   By: Elmer Picker M.D.   On: 09/02/2021 16:08   CT CHEST WO CONTRAST  Result Date: 09/01/2021 CLINICAL DATA:  Hematologic malignancy, monitor To evaluate for atypical infections in immunocompromised patient with cough EXAM: CT CHEST WITHOUT CONTRAST TECHNIQUE: Multidetector CT imaging of the chest was  performed following the standard protocol without IV contrast. RADIATION DOSE REDUCTION: This exam was performed according to the departmental dose-optimization program which includes automated exposure control, adjustment of the mA and/or kV according to patient size and/or use of iterative reconstruction technique. COMPARISON:  Chest x-ray 08/25/2021, chest x-ray 09/01/2021 FINDINGS: Cardiovascular: Right chest wall port catheter tip terminating just distal to the superior cavoatrial junction. Normal heart size. No significant pericardial effusion. The thoracic aorta is normal in caliber. Mild atherosclerotic plaque of the thoracic aorta. No coronary artery calcifications. Mediastinum/Nodes: No gross hilar adenopathy, noting limited sensitivity for the detection of hilar adenopathy on this noncontrast study. No enlarged mediastinal or axillary lymph nodes. Thyroid gland, trachea, and esophagus demonstrate no significant findings. Lungs/Pleura: Mild to moderate paraseptal and centrilobular emphysematous changes. No focal consolidation. No pulmonary nodule. No pulmonary mass. Bilateral trace pleural effusions. No pneumothorax. Upper Abdomen: No acute abnormality. Musculoskeletal: No chest wall abnormality. No suspicious lytic or blastic osseous lesions. No acute displaced fracture. Multilevel degenerative changes of the spine. IMPRESSION: 1. Bilateral trace pleural effusions. 2. Aortic Atherosclerosis (ICD10-I70.0) and Emphysema (ICD10-J43.9). Electronically Signed   By: Iven Finn M.D.   On: 09/01/2021 22:52   DG CHEST PORT 1 VIEW  Result Date: 09/01/2021 CLINICAL DATA:  Acute kidney injury and hypotension.  SIRS. EXAM: PORTABLE CHEST 1 VIEW COMPARISON:  08/31/2021 FINDINGS: Power injectable right Port-A-Cath tip: Lower SVC. Atherosclerotic calcification of the aortic arch. Upper normal heart size. Cephalization of blood flow on this mostly upright image, raising the possibility of pulmonary venous  hypertension. Linear subsegmental atelectasis or scarring along the left hemidiaphragm. IMPRESSION: 1. Cephalization of blood flow suggesting pulmonary venous hypertension. Upper normal heart size. 2. Subsegmental atelectasis along the left hemidiaphragm. 3.  Aortic Atherosclerosis (ICD10-I70.0). Electronically Signed   By: Van Clines M.D.   On: 09/01/2021 08:17   DG HIP UNILAT WITH PELVIS 2-3 VIEWS RIGHT  Result Date: 09/01/2021 CLINICAL DATA:  [place] 4 days ago with continued right hip pain. Right hip replacement 2012, revision 2014. EXAM: DG HIP (WITH OR WITHOUT PELVIS) 2-3V RIGHT COMPARISON:  Recent CT of abdomen and pelvis and reconstructions of 08/25/2021. FINDINGS: There is no evidence of hip fracture or dislocation. Right hip total joint arthroplasty appears well seated. Bone mineralization is osteopenic. There is mild arthrosis of the left hip joint. Both SI joints and pubic symphysis are unremarkable, as visualized. There are patchy calcifications in the right femoral artery. Artifact from overlying clothing. Soft tissues are otherwise unremarkable. IMPRESSION: Right hip arthroplasty.  Osteopenia without evidence of fractures. Electronically Signed   By: Ninfa Linden.D.  On: 09/01/2021 05:33   CT HEAD WO CONTRAST (5MM)  Result Date: 09/01/2021 CLINICAL DATA:  Initial evaluation for head trauma. EXAM: CT HEAD WITHOUT CONTRAST TECHNIQUE: Contiguous axial images were obtained from the base of the skull through the vertex without intravenous contrast. RADIATION DOSE REDUCTION: This exam was performed according to the departmental dose-optimization program which includes automated exposure control, adjustment of the mA and/or kV according to patient size and/or use of iterative reconstruction technique. COMPARISON:  Prior CT from 01/22/2015. FINDINGS: Brain: Mild age-related cerebral atrophy. No acute intracranial hemorrhage. No acute large vessel territory infarct. No mass lesion, mass  effect, or midline shift. No hydrocephalus or extra-axial fluid collection. Vascular: No hyperdense vessel. Skull: Scalp soft tissues demonstrate no acute finding. Calvarium intact. Osteopenia noted. Sinuses/Orbits: Globes and orbital soft tissues demonstrate no acute finding. Paranasal sinuses are largely clear. No mastoid effusion. Other: None. IMPRESSION: 1. No acute intracranial abnormality. 2. Mild age-related cerebral atrophy. Electronically Signed   By: Jeannine Boga M.D.   On: 09/01/2021 03:13   DG Chest Port 1 View  Result Date: 08/31/2021 CLINICAL DATA:  Hypotension, possible sepsis EXAM: PORTABLE CHEST 1 VIEW COMPARISON:  Portable exam 1559 hours compared to 08/25/2021 FINDINGS: RIGHT jugular Port-A-Cath with tip projecting over SVC. Upper normal heart size. Mediastinal contours and pulmonary vascularity normal. Lungs clear. No pulmonary infiltrate, pleural effusion, or pneumothorax. Bones demineralized. IMPRESSION: No acute abnormalities. Electronically Signed   By: Lavonia Dana M.D.   On: 08/31/2021 16:08   CT ABDOMEN PELVIS WO CONTRAST  Result Date: 08/25/2021 CLINICAL DATA:  Splenomegaly.  Dizziness.  History of leukemia. EXAM: CT ABDOMEN AND PELVIS WITHOUT CONTRAST TECHNIQUE: Multidetector CT imaging of the abdomen and pelvis was performed following the standard protocol without IV contrast. RADIATION DOSE REDUCTION: This exam was performed according to the departmental dose-optimization program which includes automated exposure control, adjustment of the mA and/or kV according to patient size and/or use of iterative reconstruction technique. COMPARISON:  04/07/2021 FINDINGS: Lower chest: Lung bases are clear except for minimal linear atelectasis or scarring. Hepatobiliary: Liver appears normal without contrast. Previous cholecystectomy. Pancreas: Normal Spleen: Spleen measures 13.9 by 10.4 x 5.4 cm today compared with 14.3 by 10.5 by 5.6 cm previously. Splenic index is 781. Splenic  index greater than 480 suggests splenomegaly. Estimated splenic volume is 483 cc. Adrenals/Urinary Tract: Adrenal glands are normal. Kidneys are normal. Bladder is normal. Some limitation in evaluation due to beam hardening from the right hip replacement. Stomach/Bowel: Stomach and small intestine are normal. Large amount of fecal matter in the colon but without evidence of obstruction or inflammation. Vascular/Lymphatic: Aortic atherosclerosis. No aneurysm. IVC is normal. No adenopathy. Reproductive: Previous hysterectomy.  No pelvic mass. Other: No free fluid or air. Musculoskeletal: Ordinary lower lumbar degenerative changes. Hip replacement on the right. Some osteoarthritis of the left hip. IMPRESSION: No acute organ finding. Splenomegaly as seen on the prior exam. Splenic index is 781. Estimated splenic volume is 483 cc. Aortic Atherosclerosis (ICD10-I70.0). Electronically Signed   By: Nelson Chimes M.D.   On: 08/25/2021 14:42   DG Chest Port 1 View  Result Date: 08/25/2021 CLINICAL DATA:  Dizziness, sepsis.  April 16, 2020. EXAM: PORTABLE CHEST 1 VIEW COMPARISON:  None Available. FINDINGS: The heart size and mediastinal contours are within normal limits. Both lungs are clear. Right internal jugular Port-A-Cath is unchanged in position. The visualized skeletal structures are unremarkable. IMPRESSION: No active disease. Electronically Signed   By: Bobbe Medico.D.  On: 08/25/2021 12:58     Discharge Exam: Vitals:   09/03/21 0922 09/03/21 1338  BP: 125/63 (!) 163/72  Pulse: (!) 58 65  Resp:  18  Temp:  98.1 F (36.7 C)  SpO2:  94%   Vitals:   09/02/21 2244 09/03/21 0405 09/03/21 0922 09/03/21 1338  BP: (!) 175/88 (!) 185/86 125/63 (!) 163/72  Pulse: 99 78 (!) 58 65  Resp: '18 18  18  '$ Temp: 98.7 F (37.1 C) 98.8 F (37.1 C)  98.1 F (36.7 C)  TempSrc: Oral Oral  Oral  SpO2: 99% 92%  94%  Weight:      Height:        General: Pt is alert, awake, not in acute  distress Cardiovascular: RRR, S1/S2 +, no rubs, no gallops Respiratory: CTA bilaterally, no wheezing, no rhonchi Abdominal: Soft, NT, ND, bowel sounds + Extremities: no edema, no cyanosis    The results of significant diagnostics from this hospitalization (including imaging, microbiology, ancillary and laboratory) are listed below for reference.     Microbiology: Recent Results (from the past 240 hour(s))  Blood Culture (routine x 2)     Status: None   Collection Time: 08/25/21 12:59 PM   Specimen: BLOOD  Result Value Ref Range Status   Specimen Description BLOOD  Final   Special Requests   Final    BOTTLES DRAWN AEROBIC ONLY BLOOD RIGHT HAND Blood Culture results may not be optimal due to an inadequate volume of blood received in culture bottles   Culture   Final    NO GROWTH 5 DAYS Performed at [facility], [address], [place], [place] [address]    Report Status 08/30/2021 FINAL  Final  Blood Culture (routine x 2)     Status: None (Preliminary result)   Collection Time: 08/25/21  1:01 PM   Specimen: Vein; Blood  Result Value Ref Range Status   Specimen Description PENDING  Incomplete   Special Requests   Final    BOTTLES DRAWN AEROBIC ONLY RIGHT ANTECUBITAL Blood Culture adequate volume   Culture   Final    NO GROWTH 5 DAYS Performed at [facility], [address], [place], [place] [address]    Report Status PENDING  Incomplete  Urine Culture     Status: Abnormal   Collection Time: 08/25/21  6:24 PM   Specimen: Urine, Clean Catch  Result Value Ref Range Status   Specimen Description   Final    URINE, CLEAN CATCH Performed at [facility], [address], [place], [place] [address]    Special Requests   Final    NONE Performed at [facility], [address] [place], [place] [address]    Culture 20,000 COLONIES/mL ESCHERICHIA COLI (A)  Final   Report Status 08/27/2021 FINAL  Final   Organism ID, Bacteria ESCHERICHIA COLI (A)  Final      Susceptibility    Escherichia coli - MIC*    AMPICILLIN <=2 SENSITIVE Sensitive     CEFAZOLIN <=4 SENSITIVE Sensitive     CEFEPIME <=0.12 SENSITIVE Sensitive     CEFTRIAXONE <=0.25 SENSITIVE Sensitive     CIPROFLOXACIN <=0.25 SENSITIVE Sensitive     GENTAMICIN <=1 SENSITIVE Sensitive     IMIPENEM <=0.25 SENSITIVE Sensitive     NITROFURANTOIN <=16 SENSITIVE Sensitive     TRIMETH/SULFA <=20 SENSITIVE Sensitive     AMPICILLIN/SULBACTAM <=2 SENSITIVE Sensitive     PIP/TAZO <=4 SENSITIVE Sensitive     * 20,000 COLONIES/mL ESCHERICHIA COLI  Blood culture (routine x 2)     Status: None (Preliminary result)   Collection Time: 08/31/21  3:49 PM   Specimen: BLOOD LEFT HAND  Result Value Ref Range Status   Specimen Description BLOOD LEFT HAND  Final   Special Requests   Final    Immunocompromised BOTTLES DRAWN AEROBIC ONLY Blood Culture results may not be optimal due to an inadequate volume of blood received in culture bottles   Culture   Final    NO GROWTH 3 DAYS Performed at [facility], [address], [place], [place] [address]    Report Status PENDING  Incomplete  Blood culture (routine x 2)     Status: None (Preliminary result)   Collection Time: 08/31/21  3:54 PM   Specimen: Site Not Specified; Blood  Result Value Ref Range Status   Specimen Description SITE NOT SPECIFIED  Final   Special Requests   Final    BOTTLES DRAWN AEROBIC ONLY Blood Culture results may not be optimal due to an inadequate volume of blood received in culture bottles   Culture   Final    NO GROWTH 3 DAYS Performed at [facility], [address], [place], [place] [address]    Report Status PENDING  Incomplete  MRSA Next Gen by PCR, Nasal     Status: None   Collection Time: 09/01/21  1:45 AM   Specimen: Nasal Mucosa; Nasal Swab  Result Value Ref Range Status   MRSA by PCR Next Gen NOT DETECTED NOT DETECTED Final    Comment: (NOTE) The GeneXpert MRSA Assay (FDA approved for NASAL specimens only), is one component of a  comprehensive MRSA colonization surveillance program. It is not intended to diagnose MRSA infection nor to guide or monitor treatment for MRSA infections. Test performance is not FDA approved in patients less than 72 years old. Performed at [facility], [address], [place], [place] [address]   SARS Coronavirus 2 by RT PCR (hospital order, performed in [facility] hospital lab) *cepheid single result test* Anterior Nasal Swab     Status: None   Collection Time: 09/01/21  7:50 AM   Specimen: Anterior Nasal Swab  Result Value Ref Range Status   SARS Coronavirus 2 by RT PCR NEGATIVE NEGATIVE Final    Comment: (NOTE) SARS-CoV-2 target nucleic acids are NOT DETECTED.  The SARS-CoV-2 RNA is generally detectable in upper and lower respiratory specimens during the acute phase of infection. The lowest concentration of SARS-CoV-2 viral copies this assay can detect is 250 copies / mL. A negative result does not preclude SARS-CoV-2 infection and should not be used as the sole basis for treatment or other patient management decisions.  A negative result may occur with improper specimen collection / handling, submission of specimen other than nasopharyngeal swab, presence of viral mutation(s) within the areas targeted by this assay, and inadequate number of viral copies (<250 copies / mL). A negative result must be combined with clinical observations, patient history, and epidemiological information.  Fact Sheet for Patients:   https://www.patel.info/  Fact Sheet for Healthcare Providers: https://hall.com/  This test is not yet approved or  cleared by the [place] FDA and has been authorized for detection and/or diagnosis of SARS-CoV-2 by FDA under an Emergency Use Authorization (EUA).  This EUA will remain in effect (meaning this test can be used) for the duration of the COVID-19 declaration under Section 564(b)(1) of the Act, 21  U.S.C. section 360bbb-3(b)(1), unless the authorization is terminated or revoked sooner.  Performed at [facility], [address], [place], [place] [address]   Respiratory (~20 pathogens) panel by PCR     Status: None   Collection Time: 09/02/21  6:58 AM   Specimen: Nasopharyngeal Swab; Respiratory  Result Value Ref Range Status   Adenovirus NOT DETECTED NOT DETECTED Final   Coronavirus 229E NOT DETECTED NOT DETECTED Final    Comment: (NOTE) The Coronavirus on the Respiratory Panel, DOES NOT test for the novel  Coronavirus (2019 nCoV)    Coronavirus HKU1 NOT DETECTED NOT DETECTED Final   Coronavirus NL63 NOT DETECTED NOT DETECTED Final   Coronavirus OC43 NOT DETECTED NOT DETECTED Final   Metapneumovirus NOT DETECTED NOT DETECTED Final   Rhinovirus / Enterovirus NOT DETECTED NOT DETECTED Final   Influenza A NOT DETECTED NOT DETECTED Final   Influenza B NOT DETECTED NOT DETECTED Final   Parainfluenza Virus 1 NOT DETECTED NOT DETECTED Final   Parainfluenza Virus 2 NOT DETECTED NOT DETECTED Final   Parainfluenza Virus 3 NOT DETECTED NOT DETECTED Final   Parainfluenza Virus 4 NOT DETECTED NOT DETECTED Final   Respiratory Syncytial Virus NOT DETECTED NOT DETECTED Final   Bordetella pertussis NOT DETECTED NOT DETECTED Final   Bordetella Parapertussis NOT DETECTED NOT DETECTED Final   Chlamydophila pneumoniae NOT DETECTED NOT DETECTED Final   Mycoplasma pneumoniae NOT DETECTED NOT DETECTED Final    Comment: Performed at [facility], [place]. [address], [place], [place] [address]     Labs: BNP (last 3 results) Recent Labs    09/02/21 1622  BNP 032.1*   Basic Metabolic Panel: Recent Labs  Lab 08/28/21 0514 08/31/21 1533 08/31/21 1547 09/01/21 0422 09/02/21 0537 09/03/21 0538  NA  --  134*  --  142 142 141  K  --  3.8  --  3.7 4.2 3.6  CL  --  104  --  111 113* 110  CO2  --  25  --  '26 25 26  '$ GLUCOSE  --  109*  --  92 83 95  BUN  --  41*  --  36* 23 23  CREATININE   --  2.34*  --  1.70* 1.26* 1.14*  CALCIUM  --  7.6*  --  7.9* 8.1* 8.4*  MG 1.9  --  2.0 1.7 1.6* 1.8  PHOS  --   --  4.2  --   --   --    Liver Function Tests: Recent Labs  Lab 09/01/21 0422  AST 37  ALT 18  ALKPHOS 143*  BILITOT 0.9  PROT 6.3*  ALBUMIN 3.1*   No results for input(s): "LIPASE", "AMYLASE" in the last 168 hours. No results for input(s): "AMMONIA" in the last 168 hours. CBC: Recent Labs  Lab 08/31/21 1533 09/01/21 0422 09/02/21 0537 09/03/21 0538  WBC 100.7* 109.0* 100.1* 114.1*  NEUTROABS  --  58.9* 53.1*  --   HGB 7.9* 7.7* 7.6* 9.1*  HCT 24.2* 23.6* 23.4* 27.6*  MCV 92.7 92.9 93.2 92.0  PLT 77* 108* 132* 157   Cardiac Enzymes: No results for input(s): "CKTOTAL", "CKMB", "CKMBINDEX", "TROPONINI" in the last 168 hours. BNP: Invalid input(s): "POCBNP" CBG: Recent Labs  Lab 08/31/21 1518  GLUCAP 99   D-Dimer No results for input(s): "DDIMER" in the last 72 hours. Hgb A1c No results for input(s): "HGBA1C" in the last 72 hours. Lipid Profile No results for input(s): "CHOL", "HDL", "LDLCALC", "TRIG", "CHOLHDL", "LDLDIRECT" in the last 72 hours. Thyroid function studies Recent Labs  09/01/21 0422  TSH 1.158   Anemia work up No results for input(s): "VITAMINB12", "FOLATE", "FERRITIN", "TIBC", "IRON", "RETICCTPCT" in the last 72 hours. Urinalysis    Component Value Date/Time   COLORURINE YELLOW 09/03/2021 0729   APPEARANCEUR CLEAR 09/03/2021 0729   LABSPEC 1.010 09/03/2021 0729   PHURINE 7.0 09/03/2021 0729   GLUCOSEU NEGATIVE 09/03/2021 0729   GLUCOSEU NEG mg/dL 05/26/2009 2225   HGBUR SMALL (A) 09/03/2021 0729   HGBUR negative 08/19/2008 1124   BILIRUBINUR NEGATIVE 09/03/2021 0729   BILIRUBINUR neg 08/15/2017 0955   KETONESUR NEGATIVE 09/03/2021 0729   PROTEINUR 30 (A) 09/03/2021 0729   UROBILINOGEN 1.0 08/15/2017 0955   UROBILINOGEN 0.2 12/05/2012 1150   NITRITE NEGATIVE 09/03/2021 0729   LEUKOCYTESUR NEGATIVE 09/03/2021 0729    Sepsis Labs Recent Labs  Lab 08/31/21 1533 09/01/21 0422 09/02/21 0537 09/03/21 0538  WBC 100.7* 109.0* 100.1* 114.1*   Microbiology Recent Results (from the past 240 hour(s))  Blood Culture (routine x 2)     Status: None   Collection Time: 08/25/21 12:59 PM   Specimen: BLOOD  Result Value Ref Range Status   Specimen Description BLOOD  Final   Special Requests   Final    BOTTLES DRAWN AEROBIC ONLY BLOOD RIGHT HAND Blood Culture results may not be optimal due to an inadequate volume of blood received in culture bottles   Culture   Final    NO GROWTH 5 DAYS Performed at [facility], [address], [place], [place] [address]    Report Status 08/30/2021 FINAL  Final  Blood Culture (routine x 2)     Status: None (Preliminary result)   Collection Time: 08/25/21  1:01 PM   Specimen: Vein; Blood  Result Value Ref Range Status   Specimen Description PENDING  Incomplete   Special Requests   Final    BOTTLES DRAWN AEROBIC ONLY RIGHT ANTECUBITAL Blood Culture adequate volume   Culture   Final    NO GROWTH 5 DAYS Performed at [facility], [address] [place], [place] [address]    Report Status PENDING  Incomplete  Urine Culture     Status: Abnormal   Collection Time: 08/25/21  6:24 PM   Specimen: Urine, Clean Catch  Result Value Ref Range Status   Specimen Description   Final    URINE, CLEAN CATCH Performed at [facility], [address] [place], [place] [address]    Special Requests   Final    NONE Performed at [facility], [address] [place],  [address]    Culture 20,000 COLONIES/mL ESCHERICHIA COLI (A)  Final   Report Status 08/27/2021 FINAL  Final   Organism ID, Bacteria ESCHERICHIA COLI (A)  Final      Susceptibility   Escherichia coli - MIC*    AMPICILLIN <=2 SENSITIVE Sensitive     CEFAZOLIN <=4 SENSITIVE Sensitive     CEFEPIME <=0.12 SENSITIVE Sensitive     CEFTRIAXONE <=0.25 SENSITIVE Sensitive     CIPROFLOXACIN <=0.25 SENSITIVE Sensitive      GENTAMICIN <=1 SENSITIVE Sensitive     IMIPENEM <=0.25 SENSITIVE Sensitive     NITROFURANTOIN <=16 SENSITIVE Sensitive     TRIMETH/SULFA <=20 SENSITIVE Sensitive     AMPICILLIN/SULBACTAM <=2 SENSITIVE Sensitive     PIP/TAZO <=4 SENSITIVE Sensitive     * 20,000 COLONIES/mL ESCHERICHIA COLI  Blood culture (routine x 2)     Status: None (Preliminary result)   Collection Time: 08/31/21  3:49 PM   Specimen: BLOOD  LEFT HAND  Result Value Ref Range Status   Specimen Description BLOOD LEFT HAND  Final   Special Requests   Final    Immunocompromised BOTTLES DRAWN AEROBIC ONLY Blood Culture results may not be optimal due to an inadequate volume of blood received in culture bottles   Culture   Final    NO GROWTH 3 DAYS Performed at [facility], [address], [place], [place] [address]    Report Status PENDING  Incomplete  Blood culture (routine x 2)     Status: None (Preliminary result)   Collection Time: 08/31/21  3:54 PM   Specimen: Site Not Specified; Blood  Result Value Ref Range Status   Specimen Description SITE NOT SPECIFIED  Final   Special Requests   Final    BOTTLES DRAWN AEROBIC ONLY Blood Culture results may not be optimal due to an inadequate volume of blood received in culture bottles   Culture   Final    NO GROWTH 3 DAYS Performed at [facility], [address], [place], [place] [address]    Report Status PENDING  Incomplete  MRSA Next Gen by PCR, Nasal     Status: None   Collection Time: 09/01/21  1:45 AM   Specimen: Nasal Mucosa; Nasal Swab  Result Value Ref Range Status   MRSA by PCR Next Gen NOT DETECTED NOT DETECTED Final    Comment: (NOTE) The GeneXpert MRSA Assay (FDA approved for NASAL specimens only), is one component of a comprehensive MRSA colonization surveillance program. It is not intended to diagnose MRSA infection nor to guide or monitor treatment for MRSA infections. Test performance is not FDA approved in patients less than 19 years old. Performed at  [facility] [address] [place], [place] [address]   SARS Coronavirus 2 by RT PCR (hospital order, performed in [facility] hospital lab) *cepheid single result test* Anterior Nasal Swab     Status: None   Collection Time: 09/01/21  7:50 AM   Specimen: Anterior Nasal Swab  Result Value Ref Range Status   SARS Coronavirus 2 by RT PCR NEGATIVE NEGATIVE Final    Comment: (NOTE) SARS-CoV-2 target nucleic acids are NOT DETECTED.  The SARS-CoV-2 RNA is generally detectable in upper and lower respiratory specimens during the acute phase of infection. The lowest concentration of SARS-CoV-2 viral copies this assay can detect is 250 copies / mL. A negative result does not preclude SARS-CoV-2 infection and should not be used as the sole basis for treatment or other patient management decisions.  A negative result may occur with improper specimen collection / handling, submission of specimen other than nasopharyngeal swab, presence of viral mutation(s) within the areas targeted by this assay, and inadequate number of viral copies (<250 copies / mL). A negative result must be combined with clinical observations, patient history, and epidemiological information.  Fact Sheet for Patients:   https://www.patel.info/  Fact Sheet for Healthcare Providers: https://hall.com/  This test is not yet approved or  cleared by the [place] FDA and has been authorized for detection and/or diagnosis of SARS-CoV-2 by FDA under an Emergency Use Authorization (EUA).  This EUA will remain in effect (meaning this test can be used) for the duration of the COVID-19 declaration under Section 564(b)(1) of the Act, 21 U.S.C. section 360bbb-3(b)(1), unless the authorization is terminated or revoked sooner.  Performed at [facility], [address] [place], [place] [address]   Respiratory (~20 pathogens) panel by PCR     Status: None  Collection Time: 09/02/21  6:58  AM   Specimen: Nasopharyngeal Swab; Respiratory  Result Value Ref Range Status   Adenovirus NOT DETECTED NOT DETECTED Final   Coronavirus 229E NOT DETECTED NOT DETECTED Final    Comment: (NOTE) The Coronavirus on the Respiratory Panel, DOES NOT test for the novel  Coronavirus (2019 nCoV)    Coronavirus HKU1 NOT DETECTED NOT DETECTED Final   Coronavirus NL63 NOT DETECTED NOT DETECTED Final   Coronavirus OC43 NOT DETECTED NOT DETECTED Final   Metapneumovirus NOT DETECTED NOT DETECTED Final   Rhinovirus / Enterovirus NOT DETECTED NOT DETECTED Final   Influenza A NOT DETECTED NOT DETECTED Final   Influenza B NOT DETECTED NOT DETECTED Final   Parainfluenza Virus 1 NOT DETECTED NOT DETECTED Final   Parainfluenza Virus 2 NOT DETECTED NOT DETECTED Final   Parainfluenza Virus 3 NOT DETECTED NOT DETECTED Final   Parainfluenza Virus 4 NOT DETECTED NOT DETECTED Final   Respiratory Syncytial Virus NOT DETECTED NOT DETECTED Final   Bordetella pertussis NOT DETECTED NOT DETECTED Final   Bordetella Parapertussis NOT DETECTED NOT DETECTED Final   Chlamydophila pneumoniae NOT DETECTED NOT DETECTED Final   Mycoplasma pneumoniae NOT DETECTED NOT DETECTED Final    Comment: Performed at [facility], [place]. [address] [place], [place] [address]     Time coordinating discharge: 35 minutes  SIGNED:   Rodena Goldmann, DO [facility] 09/03/2021, 1:57 PM  If 7PM-7AM, please contact night-coverage [email or web address]

## 2021-09-03 NOTE — TOC Transition Note (Signed)
Transition of Care [facility]) - CM/SW Discharge Note   Patient Details  Name: Renee Waters MRN: [id number] Date of Birth: 01-12-1946  Transition of Care [facility]) CM/SW Contact:  Iona Beard, [place] Phone Number: 09/03/2021, 11:06 AM   Clinical Narrative:    CSW spoke with pt about HH. Pt states she is agreeable to [facility] RN and Aide. CSW updated that [facility] accepted referral and will reach out to set up appointments. CSW updated [facility] HH rep Marjory Lies of plan for D/C today. TOC signing off.   Final next level of care: [place] Barriers to Discharge: Continued Medical Work up   Patient Goals and CMS Choice Patient states their goals for this hospitalization and ongoing recovery are:: return home with [facility] CMS Medicare.gov Compare Post Acute Care list provided to:: Patient Choice offered to / list presented to : Patient, Adult Children  Discharge Placement                       Discharge Plan and Services In-house Referral: Clinical Social Work Discharge Planning Services: CM Consult Post Acute Care Choice: Home Health                    HH Arranged: RN, Nurse's Aide [facility] Agency: [place] Date [place]: 09/03/21   Representative spoke with at [place]: [place] ([place]) Interventions     Readmission Risk Interventions     No data to display

## 2021-09-03 NOTE — Progress Notes (Signed)
[name] to be D/C'd Home with home health per MD order.  Discussed with the patient and all questions fully answered.  VSS, Skin clean, dry and intact without evidence of skin break down, no evidence of skin tears noted. IV catheter discontinued intact. Site without signs and symptoms of complications. Dressing and pressure applied.  An After Visit Summary was printed and given to the patient. Patient prescriptions sent to pharmacy and home medications returned to patient.   D/c education completed with patient/family including follow up instructions, medication list, d/c activities limitations if indicated, with other d/c instructions as indicated by MD - patient able to verbalize understanding, all questions fully answered.   Patient instructed to return to ED, call 911, or call MD for any changes in condition.   Patient escorted via [place], and D/C home via private auto.  Manuella Ghazi 09/03/2021 3:19 PM

## 2021-09-04 ENCOUNTER — Other Ambulatory Visit (HOSPITAL_COMMUNITY): Payer: Self-pay | Admitting: Hematology

## 2021-09-04 DIAGNOSIS — D471 Chronic myeloproliferative disease: Secondary | ICD-10-CM | POA: Diagnosis not present

## 2021-09-04 DIAGNOSIS — R32 Unspecified urinary incontinence: Secondary | ICD-10-CM | POA: Diagnosis not present

## 2021-09-04 DIAGNOSIS — Z87891 Personal history of nicotine dependence: Secondary | ICD-10-CM | POA: Diagnosis not present

## 2021-09-04 DIAGNOSIS — N179 Acute kidney failure, unspecified: Secondary | ICD-10-CM | POA: Diagnosis not present

## 2021-09-04 DIAGNOSIS — J9601 Acute respiratory failure with hypoxia: Secondary | ICD-10-CM | POA: Diagnosis not present

## 2021-09-04 DIAGNOSIS — Z9181 History of falling: Secondary | ICD-10-CM | POA: Diagnosis not present

## 2021-09-04 DIAGNOSIS — F5105 Insomnia due to other mental disorder: Secondary | ICD-10-CM | POA: Diagnosis not present

## 2021-09-04 DIAGNOSIS — K2270 Barrett's esophagus without dysplasia: Secondary | ICD-10-CM | POA: Diagnosis not present

## 2021-09-04 DIAGNOSIS — E559 Vitamin D deficiency, unspecified: Secondary | ICD-10-CM | POA: Diagnosis not present

## 2021-09-04 DIAGNOSIS — G8929 Other chronic pain: Secondary | ICD-10-CM | POA: Diagnosis not present

## 2021-09-04 DIAGNOSIS — Z87440 Personal history of urinary (tract) infections: Secondary | ICD-10-CM | POA: Diagnosis not present

## 2021-09-04 DIAGNOSIS — M1990 Unspecified osteoarthritis, unspecified site: Secondary | ICD-10-CM | POA: Diagnosis not present

## 2021-09-04 DIAGNOSIS — M329 Systemic lupus erythematosus, unspecified: Secondary | ICD-10-CM | POA: Diagnosis not present

## 2021-09-04 DIAGNOSIS — F419 Anxiety disorder, unspecified: Secondary | ICD-10-CM | POA: Diagnosis not present

## 2021-09-04 DIAGNOSIS — M519 Unspecified thoracic, thoracolumbar and lumbosacral intervertebral disc disorder: Secondary | ICD-10-CM | POA: Diagnosis not present

## 2021-09-04 DIAGNOSIS — D509 Iron deficiency anemia, unspecified: Secondary | ICD-10-CM | POA: Diagnosis not present

## 2021-09-04 DIAGNOSIS — H269 Unspecified cataract: Secondary | ICD-10-CM | POA: Diagnosis not present

## 2021-09-04 DIAGNOSIS — N183 Chronic kidney disease, stage 3 unspecified: Secondary | ICD-10-CM | POA: Diagnosis not present

## 2021-09-04 DIAGNOSIS — K219 Gastro-esophageal reflux disease without esophagitis: Secondary | ICD-10-CM | POA: Diagnosis not present

## 2021-09-04 DIAGNOSIS — Z8619 Personal history of other infectious and parasitic diseases: Secondary | ICD-10-CM | POA: Diagnosis not present

## 2021-09-04 DIAGNOSIS — I959 Hypotension, unspecified: Secondary | ICD-10-CM | POA: Diagnosis not present

## 2021-09-04 DIAGNOSIS — F331 Major depressive disorder, recurrent, moderate: Secondary | ICD-10-CM | POA: Diagnosis not present

## 2021-09-04 DIAGNOSIS — I129 Hypertensive chronic kidney disease with stage 1 through stage 4 chronic kidney disease, or unspecified chronic kidney disease: Secondary | ICD-10-CM | POA: Diagnosis not present

## 2021-09-04 DIAGNOSIS — K5900 Constipation, unspecified: Secondary | ICD-10-CM | POA: Diagnosis not present

## 2021-09-04 LAB — TYPE AND SCREEN
ABO/RH(D): O POS
Antibody Screen: POSITIVE
DAT, IgG: NEGATIVE
Donor AG Type: NEGATIVE
Donor AG Type: NEGATIVE
Unit division: 0
Unit division: 0

## 2021-09-04 LAB — URINE CULTURE: Culture: NO GROWTH

## 2021-09-04 LAB — BPAM RBC
Blood Product Expiration Date: 202308272359
Blood Product Expiration Date: 202308272359
ISSUE DATE / TIME: 202307271155
ISSUE DATE / TIME: 202307271155
Unit Type and Rh: 5100
Unit Type and Rh: 5100

## 2021-09-04 LAB — CULTURE, BLOOD (ROUTINE X 2): Culture: NO GROWTH

## 2021-09-05 LAB — CULTURE, BLOOD (ROUTINE X 2)
Culture: NO GROWTH
Culture: NO GROWTH

## 2021-09-06 ENCOUNTER — Telehealth: Payer: Self-pay | Admitting: Family Medicine

## 2021-09-06 ENCOUNTER — Inpatient Hospital Stay (HOSPITAL_COMMUNITY): Payer: Medicare Other | Attending: Hematology

## 2021-09-06 ENCOUNTER — Other Ambulatory Visit (HOSPITAL_COMMUNITY): Payer: Self-pay | Admitting: *Deleted

## 2021-09-06 ENCOUNTER — Inpatient Hospital Stay (HOSPITAL_BASED_OUTPATIENT_CLINIC_OR_DEPARTMENT_OTHER): Payer: Medicare Other | Admitting: Hematology

## 2021-09-06 ENCOUNTER — Telehealth: Payer: Self-pay

## 2021-09-06 ENCOUNTER — Encounter: Payer: Self-pay | Admitting: Surgery

## 2021-09-06 DIAGNOSIS — C946 Myelodysplastic disease, not classified: Secondary | ICD-10-CM | POA: Diagnosis not present

## 2021-09-06 DIAGNOSIS — Z87891 Personal history of nicotine dependence: Secondary | ICD-10-CM | POA: Insufficient documentation

## 2021-09-06 DIAGNOSIS — Z79899 Other long term (current) drug therapy: Secondary | ICD-10-CM | POA: Diagnosis not present

## 2021-09-06 DIAGNOSIS — D471 Chronic myeloproliferative disease: Secondary | ICD-10-CM

## 2021-09-06 DIAGNOSIS — R197 Diarrhea, unspecified: Secondary | ICD-10-CM

## 2021-09-06 LAB — CBC WITH DIFFERENTIAL/PLATELET
Abs Immature Granulocytes: 17.5 10*3/uL — ABNORMAL HIGH (ref 0.00–0.07)
Basophils Absolute: 0 10*3/uL (ref 0.0–0.1)
Basophils Relative: 0 %
Blasts: 1 %
Eosinophils Absolute: 0 10*3/uL (ref 0.0–0.5)
Eosinophils Relative: 0 %
HCT: 28.6 % — ABNORMAL LOW (ref 36.0–46.0)
Hemoglobin: 9.3 g/dL — ABNORMAL LOW (ref 12.0–15.0)
Lymphocytes Relative: 8 %
Lymphs Abs: 10.8 10*3/uL — ABNORMAL HIGH (ref 0.7–4.0)
MCH: 30.7 pg (ref 26.0–34.0)
MCHC: 32.5 g/dL (ref 30.0–36.0)
MCV: 94.4 fL (ref 80.0–100.0)
Metamyelocytes Relative: 5 %
Monocytes Absolute: 13.5 10*3/uL — ABNORMAL HIGH (ref 0.1–1.0)
Monocytes Relative: 10 %
Myelocytes: 6 %
Neutro Abs: 91.6 10*3/uL — ABNORMAL HIGH (ref 1.7–7.7)
Neutrophils Relative %: 68 %
Platelets: 212 10*3/uL (ref 150–400)
Promyelocytes Relative: 2 %
RBC: 3.03 MIL/uL — ABNORMAL LOW (ref 3.87–5.11)
RDW: 22.3 % — ABNORMAL HIGH (ref 11.5–15.5)
WBC: 134.7 10*3/uL (ref 4.0–10.5)
nRBC: 2.8 % — ABNORMAL HIGH (ref 0.0–0.2)
nRBC: 4 /100 WBC — ABNORMAL HIGH

## 2021-09-06 LAB — COMPREHENSIVE METABOLIC PANEL
ALT: 26 U/L (ref 0–44)
AST: 47 U/L — ABNORMAL HIGH (ref 15–41)
Albumin: 3.5 g/dL (ref 3.5–5.0)
Alkaline Phosphatase: 187 U/L — ABNORMAL HIGH (ref 38–126)
Anion gap: 5 (ref 5–15)
BUN: 24 mg/dL — ABNORMAL HIGH (ref 8–23)
CO2: 23 mmol/L (ref 22–32)
Calcium: 8.1 mg/dL — ABNORMAL LOW (ref 8.9–10.3)
Chloride: 113 mmol/L — ABNORMAL HIGH (ref 98–111)
Creatinine, Ser: 1.19 mg/dL — ABNORMAL HIGH (ref 0.44–1.00)
GFR, Estimated: 48 mL/min — ABNORMAL LOW (ref >60)
Glucose, Bld: 83 mg/dL (ref 70–99)
Potassium: 4.3 mmol/L (ref 3.5–5.1)
Sodium: 141 mmol/L (ref 135–145)
Total Bilirubin: 0.8 mg/dL (ref 0.3–1.2)
Total Protein: 7.5 g/dL (ref 6.5–8.1)

## 2021-09-06 LAB — SAMPLE TO BLOOD BANK

## 2021-09-06 MED ORDER — FUROSEMIDE 20 MG PO TABS: 20.0000 mg | ORAL_TABLET | Freq: Every day | ORAL | 2 refills | Status: DC | PRN

## 2021-09-06 MED ORDER — HEPARIN SOD (PORK) LOCK FLUSH 100 UNIT/ML IV SOLN
500.0000 [IU] | Freq: Once | INTRAVENOUS | Status: AC
  Administered 2021-09-06: 500 [IU] via INTRAVENOUS

## 2021-09-06 MED ORDER — SODIUM CHLORIDE 0.9% FLUSH
10.0000 mL | Freq: Once | INTRAVENOUS | Status: AC
  Administered 2021-09-06: 10 mL via INTRAVENOUS

## 2021-09-06 NOTE — Telephone Encounter (Signed)
Renee Waters with [facility] [phone], called stating she would like to follow pt care for HTN control, medication management, & respiratory failure & disease management. Wants to add therapy for muscle weakness. She was hospitalized for respiratory failure & kidney disease. Also has had weight loss.       Fax # [phone]

## 2021-09-06 NOTE — Progress Notes (Signed)
CRITICAL VALUE STICKER  CRITICAL VALUE:WBC 134.7  RECEIVER (on-site recipient of call):Shriya Aker rn  DATE & TIME NOTIFIED: 09/06/2021 t 1338  MESSENGER (representative from lab):[place] in lab  MD NOTIFIED: [email or web address]  TIME OF NOTIFICATION:09/06/2021 at 1340  RESPONSE:  Oncology follow up today.

## 2021-09-06 NOTE — Patient Instructions (Addendum)
[place] at [facility] Discharge Instructions   You were seen and examined today by [name].  He discussed with you collecting a stool sample for C. Diff. This infection can be caused by getting broad spectrum antibiotics in the hospital and can cause diarrhea.   Try taking Pepcid 20 mg (over the counter) at 5 pm and see if this helps with your cough.   Blood work from today is pending.    Thank you for choosing [place] at [facility] to provide your oncology and hematology care.  To afford each patient quality time with our provider, please arrive at least 15 minutes before your scheduled appointment time.   If you have a lab appointment with the [place] [facility] please come in thru the Main Entrance and check in at the main information desk.  You need to re-schedule your appointment should you arrive 10 or more minutes late.  We strive to give you quality time with our providers, and arriving late affects you and other patients whose appointments are after yours.  Also, if you no show three or more times for appointments you may be dismissed from the clinic at the providers discretion.     Again, thank you for choosing [facility].  Our hope is that these requests will decrease the amount of time that you wait before being seen by our physicians.       _____________________________________________________________  Should you have questions after your visit to [facility], please contact our office at [phone] and follow the prompts.  Our office hours are 8:00 a.m. and 4:30 p.m. Monday - Friday.  Please note that voicemails left after 4:00 p.m. may not be returned until the following business day.  We are closed weekends and major holidays.  You do have access to a nurse 24-7, just call the main number to the clinic [phone] and do not press any options, hold on the line and a nurse will answer the  phone.    For prescription refill requests, have your pharmacy contact our office and allow 72 hours.    Due to Covid, you will need to wear a mask upon entering the hospital. If you do not have a mask, a mask will be given to you at the Main Entrance upon arrival. For doctor visits, patients may have 1 support person age 66 or older with them. For treatment visits, patients can not have anyone with them due to social distancing guidelines and our immunocompromised population.

## 2021-09-06 NOTE — Progress Notes (Signed)
TOC call made 

## 2021-09-06 NOTE — Progress Notes (Signed)
[address], [place] [address]   CLINIC:  Medical Oncology/Hematology  PCP:  [name], MD [address], [place] / [place] [address] [phone]   REASON FOR VISIT:  Follow-up for MPL positive myeloproliferative neoplasm  PRIOR THERAPY: none  NGS Results: not done  CURRENT THERAPY: Jakafi 10 mg twice daily  INTERVAL HISTORY:  [name], a 76 y.o. female, returns for routine follow-up of her MPL positive myeloproliferative neoplasm. Renee Waters was last seen on 08/05/2021.   Today she reports feeling good. She reports watery diarrhea. She denies fevers. Her energy has improved. She reports acid reflux. She reports swelling in her ankles and feet. She denies history of C. Diff. Her appetite has improved.   REVIEW OF SYSTEMS:  Review of Systems  Constitutional:  Negative for appetite change, fatigue (improved) and fever.  Respiratory:  Positive for cough.   Cardiovascular:  Positive for leg swelling (ankles and feet).  Gastrointestinal:  Positive for diarrhea.  Musculoskeletal:  Positive for arthralgias (6/10 hips and legs).  Psychiatric/Behavioral:  Positive for sleep disturbance.   All other systems reviewed and are negative.   PAST MEDICAL/SURGICAL HISTORY:  Past Medical History:  Diagnosis Date   Acute cholangitis    Allergy    Anemia    Anxiety    Arthritis    Phreesia 03/18/2020   Barrett's esophagus    Cataract    Chronic back pain    Chronic neck pain    CKD (chronic kidney disease) stage 3, GFR 30-59 ml/min ([facility]) 10/29/2020   Depression    Genital herpes    GERD (gastroesophageal reflux disease)    H/O degenerative disc disease    History of blood transfusion    Hypertension    Insomnia    Lupus (systemic lupus erythematosus) ([facility])    Neuromuscular disorder ([facility])    Osteoarthritis    S/P colonoscopy June 2005   normal, no polyps   S/P endoscopy June 2005, Oct 2009   2005: short-segment  Barrett's, 2009: short-segment Barrett's   Upper respiratory tract infection 07/09/2020   UTI (lower urinary tract infection) 11/2012   Past Surgical History:  Procedure Laterality Date   ABDOMINAL HYSTERECTOMY     BACK SURGERY     BIOPSY N/A 03/20/2014   Procedure: BIOPSY;  Surgeon: Daneil Dolin, MD;  Location: [facility];  Service: Endoscopy;  Laterality: N/A;   BIOPSY  09/14/2015   Procedure: BIOPSY;  Surgeon: Daneil Dolin, MD;  Location: [facility];  Service: Endoscopy;;  esophageal and gastric   BIOPSY  07/23/2019   Procedure: BIOPSY;  Surgeon: Daneil Dolin, MD;  Location: [facility];  Service: Endoscopy;;  esophageal    CARPAL TUNNEL RELEASE Left 2013   cervical disectomy  2002   CESAREAN SECTION N/A    Phreesia 03/18/2020   CHOLECYSTECTOMY     with lysis of adhesions for sbo; "ruptured gallbladder".   COLONOSCOPY  11/09/2011   RMR: Melanosis coli   COLONOSCOPY WITH PROPOFOL N/A 09/14/2015   Dr. Gala Romney: diverticulosis, 30m TA removed. next TCS 09/2020.    COLONOSCOPY WITH PROPOFOL N/A 04/30/2020   Procedure: COLONOSCOPY WITH PROPOFOL;  Surgeon: RDaneil Dolin MD;  [facility]: [facility];  Service: Endoscopy;  Laterality: N/A;  PM (ASA 3)   DENTAL SURGERY  11/2015   multiple tooth extraction   ESOPHAGOGASTRODUODENOSCOPY  11/29/2007   salmon-colored  tongue   longest stable at  3 cm,  distal esophagus as described previously status post biopsy/ Hiatal hernia, otherwise normal stomach D1 and D2   ESOPHAGOGASTRODUODENOSCOPY  01/06/11   short segment Barrett's esophagus s/p bx/Hiatal hernia   ESOPHAGOGASTRODUODENOSCOPY (EGD) WITH PROPOFOL N/A 03/20/2014   RMR:abnormal distal esophagus short segment barrett's, bx with no dysplasia. next egd in 03/2017   ESOPHAGOGASTRODUODENOSCOPY (EGD) WITH PROPOFOL N/A 09/14/2015   Dr. Rourk: Barrett's without dysplasia, gastritis benign bx, hiatal hernia. next EGD 09/2018.   ESOPHAGOGASTRODUODENOSCOPY (EGD) WITH PROPOFOL N/A 07/23/2019   Procedure:  ESOPHAGOGASTRODUODENOSCOPY (EGD) WITH PROPOFOL;  Surgeon: Rourk, Robert M, MD;  [facility]: [facility];  Service: Endoscopy;  Laterality: N/A;  3:00pm   EYE SURGERY N/A    Phreesia 03/18/2020   HERNIA REPAIR Right 07/2010   Dr. Beachman   IR IMAGING GUIDED PORT INSERTION  02/09/2021   JOINT REPLACEMENT     LAPAROSCOPIC CHOLECYSTECTOMY  2017   at [facility]   POLYPECTOMY  09/14/2015   Procedure: POLYPECTOMY;  Surgeon: Robert M Rourk, MD;  Location: [facility];  Service: Endoscopy;;  ascending colon   right hip replacement  07/2010   went back in sept 2012 to fix   SHOULDER ARTHROSCOPY  2008   left   SPINE SURGERY N/A    Phreesia 03/18/2020   TOTAL HIP REVISION Right 12/17/2012   Procedure: RIGHT TOTAL HIP REVISION;  Surgeon: Matthew D Olin, MD;  Location: [facility];  Service: Orthopedics;  Laterality: Right;   WRIST SURGERY Right 2011   open reduction right wrist.    SOCIAL HISTORY:  Social History   Socioeconomic History   Marital status: Married    Spouse name: louis   Number of children: 4   Years of education: 12+   Highest education level: Some college, no degree  Occupational History   Occupation: accounting  - retired   Occupation: non profit  Tobacco Use   Smoking status: Former    Packs/day: 0.25    Years: 25.00    Total pack years: 6.25    Types: Cigarettes    Quit date: 02/07/2003    Years since quitting: 18.5   Smokeless tobacco: Never   Tobacco comments:    quit in 2004  Vaping Use   Vaping Use: Never used  Substance and Sexual Activity   Alcohol use: No   Drug use: No   Sexual activity: Not Currently    Birth control/protection: Surgical  Other Topics Concern   Not on file  Social History Narrative   Not on file   Social Determinants of Health   Financial Resource Strain: Low Risk  (12/28/2020)   Overall Financial Resource Strain (CARDIA)    Difficulty of Paying Living Expenses: Not hard at all  Food Insecurity: No Food Insecurity (12/28/2020)    Hunger Vital Sign    Worried About Running Out of Food in the Last Year: Never true    Ran Out of Food in the Last Year: Never true  Transportation Needs: No Transportation Needs (12/28/2020)   PRAPARE - Transportation    Lack of Transportation (Medical): No    Lack of Transportation (Non-Medical): No  Recent Concern: Transportation Needs - Unmet Transportation Needs (10/13/2020)   PRAPARE - Transportation    Lack of Transportation (Medical): Yes    Lack of Transportation (Non-Medical): Yes  Physical Activity: Inactive (04/23/2021)   Exercise Vital Sign    Days of Exercise per Week: 0 days    Minutes of Exercise per Session: 0 min  Stress: Stress Concern Present (  04/23/2021)   [employer] - Occupational Stress Questionnaire    Feeling of Stress : Rather much  Social Connections: Moderately Integrated (12/28/2020)   Social Connection and Isolation Panel [NHANES]    Frequency of Communication with Friends and Family: More than three times a week    Frequency of Social Gatherings with Friends and Family: Once a week    Attends Religious Services: More than 4 times per year    Active Member of Clubs or Organizations: No    Attends Club or Organization Meetings: Never    Marital Status: Married  Intimate Partner Violence: Not At Risk (12/28/2020)   Humiliation, Afraid, Rape, and Kick questionnaire    Fear of Current or Ex-Partner: No    Emotionally Abused: No    Physically Abused: No    Sexually Abused: No    FAMILY HISTORY:  Family History  Problem Relation Age of Onset   Hypertension Mother    Stroke Mother    Colon cancer Neg Hx    Anesthesia problems Neg Hx    Hypotension Neg Hx    Malignant hyperthermia Neg Hx    Pseudochol deficiency Neg Hx    Gastric cancer Neg Hx    Esophageal cancer Neg Hx     CURRENT MEDICATIONS:  Current Outpatient Medications  Medication Sig Dispense Refill   acyclovir (ZOVIRAX) 800 MG tablet Take 1 tablet (800 mg  total) by mouth 5 (five) times daily. (Patient taking differently: Take 400 mg by mouth 3 (three) times daily.) 50 tablet 1   acyclovir ointment (ZOVIRAX) 5 % Apply 1 application. topically every 3 (three) hours. 15 g 1   allopurinol (ZYLOPRIM) 300 MG tablet Take 1 tablet by mouth once daily 30 tablet 0   amoxicillin-clavulanate (AUGMENTIN) 500-125 MG tablet Take 1 tablet (500 mg total) by mouth 3 (three) times daily for 3 days. 9 tablet 0   citalopram (CELEXA) 20 MG tablet Take 1 tablet by mouth once daily 30 tablet 3   EPINEPHRINE 0.3 mg/0.3 mL IJ SOAJ injection INJECT 0.3 MLS INTO MUSCLE ONCE AS NEEDED (Patient taking differently: Inject 0.3 mg into the muscle as needed for anaphylaxis.) 1 Device 2   gabapentin (NEURONTIN) 300 MG capsule Take one capsule by mouth once daily, as needed, for pain 90 capsule 1   JAKAFI 15 MG tablet TAKE 1 TABLET (15MG) BY MOUTH TWICE A DAY 60 tablet 3   lidocaine-prilocaine (EMLA) cream Apply 1 application. topically as needed. 30 g 3   losartan (COZAAR) 25 MG tablet Take 1 tablet by mouth once daily for blood pressure 30 tablet 5   meloxicam (MOBIC) 7.5 MG tablet Take 1 tablet (7.5 mg total) by mouth daily as needed for pain (knee pain). 30 tablet 5   metoprolol tartrate (LOPRESSOR) 25 MG tablet Take 1 tablet (25 mg total) by mouth 2 (two) times daily. 60 tablet 1   mirtazapine (REMERON) 15 MG tablet Take 1 tablet (15 mg total) by mouth at bedtime. 30 tablet 5   oxyCODONE (OXY IR/ROXICODONE) 5 MG immediate release tablet Take 5 mg by mouth every 4 (four) hours as needed.     pantoprazole (PROTONIX) 40 MG tablet TAKE 1 TABLET BY MOUTH ONCE DAILY BEFORE BREAKFAST 90 tablet 0   potassium chloride (KLOR-CON) 10 MEQ tablet Take 1 tablet by mouth once daily 30 tablet 2   temazepam (RESTORIL) 30 MG capsule Take 1 capsule (30 mg total) by mouth at bedtime as needed for sleep. 30 capsule   5   tiZANidine (ZANAFLEX) 4 MG tablet Take 8 mg by mouth 3 (three) times daily as  needed (spasms). 180 tablet 0   No current facility-administered medications for this visit.   Facility-Administered Medications Ordered in Other Visits  Medication Dose Route Frequency Provider Last Rate Last Admin   lanreotide acetate (SOMATULINE DEPOT) 120 MG/0.5ML injection            octreotide (SANDOSTATIN LAR) 30 MG IM injection             ALLERGIES:  Allergies  Allergen Reactions   Bee Venom Swelling and Hives   Acetaminophen     itches   Amlodipine     Hair loss   Tyloxapol    Other Itching, Rash and Swelling   Oxycodone-Acetaminophen Rash    Pt states, "this gives her a rash, but at home she takes oxycodone for pain relief" Pt states, "this gives her a rash, but at home she takes oxycodone for pain relief"    PHYSICAL EXAM:  Performance status (ECOG): 1 - Symptomatic but completely ambulatory  There were no vitals filed for this visit. Wt Readings from Last 3 Encounters:  09/06/21 131 lb 6.4 oz (59.6 kg)  09/01/21 140 lb 3.4 oz (63.6 kg)  08/26/21 131 lb 11.2 oz (59.7 kg)   Physical Exam Vitals reviewed.  Constitutional:      Appearance: Normal appearance.  Cardiovascular:     Rate and Rhythm: Normal rate and regular rhythm.     Pulses: Normal pulses.     Heart sounds: Normal heart sounds.  Pulmonary:     Effort: Pulmonary effort is normal.     Breath sounds: Normal breath sounds.  Abdominal:     Palpations: Abdomen is soft. There is hepatomegaly (palpable 3 fingerbreadths below costal margin) and splenomegaly (2 fingerbreadths below costal margin'). There is no mass.     Tenderness: There is no abdominal tenderness.  Musculoskeletal:     Right lower leg: 1+ Edema present.     Left lower leg: 1+ Edema present.  Neurological:     General: No focal deficit present.     Mental Status: She is alert and oriented to person, place, and time.  Psychiatric:        Mood and Affect: Mood normal.        Behavior: Behavior normal.      LABORATORY DATA:  I  have reviewed the labs as listed.     Latest Ref Rng & Units 09/03/2021    5:38 AM 09/02/2021    5:37 AM 09/01/2021    4:22 AM  CBC  WBC 4.0 - 10.5 K/uL 114.1  100.1  109.0   Hemoglobin 12.0 - 15.0 g/dL 9.1  7.6  7.7   Hematocrit 36.0 - 46.0 % 27.6  23.4  23.6   Platelets 150 - 400 K/uL 157  132  108       Latest Ref Rng & Units 09/03/2021    5:38 AM 09/02/2021    5:37 AM 09/01/2021    4:22 AM  CMP  Glucose 70 - 99 mg/dL 95  83  92   BUN 8 - 23 mg/dL 23  23  36   Creatinine 0.44 - 1.00 mg/dL 1.14  1.26  1.70   Sodium 135 - 145 mmol/L 141  142  142   Potassium 3.5 - 5.1 mmol/L 3.6  4.2  3.7   Chloride 98 - 111 mmol/L 110  113  111   CO2   22 - 32 mmol/L _0 Calcium 8.9 - 10.3 mg/dL 8.4  8.1  7.9   Total Protein 6.5 - 8.1 g/dL   6.3   Total Bilirubin 0.3 - 1.2 mg/dL   0.9   Alkaline Phos 38 - 126 U/L   143   AST 15 - 41 U/L   37   ALT 0 - 44 U/L   18     DIAGNOSTIC IMAGING:  I have independently reviewed the scans and discussed with the patient. DG CHEST PORT 1 VIEW  Result Date: 09/02/2021 CLINICAL DATA:  Hypotension EXAM: PORTABLE CHEST 1 VIEW COMPARISON:  Previous studies including the examination of 09/01/2021 FINDINGS: Transverse diameter heart is increased. Thoracic aorta is tortuous and ectatic. Central pulmonary vessels are slightly less prominent. There are no signs of alveolar pulmonary edema. Small patchy infiltrate is seen in the lower lung fields. Costophrenic angles are clear. There is faint air soft tissue interface in the lateral aspect of right mid lung field. This may be an artifact due to skin fold. Possibility of small pneumothorax is not excluded. Tip of right IJ chest port is seen in superior vena cava. IMPRESSION: There is faint area soft tissue interface in the lateral aspect of right mid lung field, possibly an artifact due to skin fold. Less likely possibility would be small right pneumothorax. Repeat chest radiographs should be considered. Cardiomegaly.  Central pulmonary vessels are slightly less prominent. There are no signs of alveolar pulmonary edema or focal pulmonary consolidation. There are small linear densities in both lower lung fields suggesting subsegmental atelectasis. Electronically Signed   By: Elmer Picker M.D.   On: 09/02/2021 16:08   CT CHEST WO CONTRAST  Result Date: 09/01/2021 CLINICAL DATA:  Hematologic malignancy, monitor To evaluate for atypical infections in immunocompromised patient with cough EXAM: CT CHEST WITHOUT CONTRAST TECHNIQUE: Multidetector CT imaging of the chest was performed following the standard protocol without IV contrast. RADIATION DOSE REDUCTION: This exam was performed according to the departmental dose-optimization program which includes automated exposure control, adjustment of the mA and/or kV according to patient size and/or use of iterative reconstruction technique. COMPARISON:  Chest x-ray 08/25/2021, chest x-ray 09/01/2021 FINDINGS: Cardiovascular: Right chest wall port catheter tip terminating just distal to the superior cavoatrial junction. Normal heart size. No significant pericardial effusion. The thoracic aorta is normal in caliber. Mild atherosclerotic plaque of the thoracic aorta. No coronary artery calcifications. Mediastinum/Nodes: No gross hilar adenopathy, noting limited sensitivity for the detection of hilar adenopathy on this noncontrast study. No enlarged mediastinal or axillary lymph nodes. Thyroid gland, trachea, and esophagus demonstrate no significant findings. Lungs/Pleura: Mild to moderate paraseptal and centrilobular emphysematous changes. No focal consolidation. No pulmonary nodule. No pulmonary mass. Bilateral trace pleural effusions. No pneumothorax. Upper Abdomen: No acute abnormality. Musculoskeletal: No chest wall abnormality. No suspicious lytic or blastic osseous lesions. No acute displaced fracture. Multilevel degenerative changes of the spine. IMPRESSION: 1. Bilateral trace  pleural effusions. 2. Aortic Atherosclerosis (ICD10-I70.0) and Emphysema (ICD10-J43.9). Electronically Signed   By: Iven Finn M.D.   On: 09/01/2021 22:52   DG CHEST PORT 1 VIEW  Result Date: 09/01/2021 CLINICAL DATA:  Acute kidney injury and hypotension.  SIRS. EXAM: PORTABLE CHEST 1 VIEW COMPARISON:  08/31/2021 FINDINGS: Power injectable right Port-A-Cath tip: Lower SVC. Atherosclerotic calcification of the aortic arch. Upper normal heart size. Cephalization of blood flow on this mostly upright image, raising the possibility of pulmonary venous hypertension. Linear subsegmental atelectasis or  scarring along the left hemidiaphragm. IMPRESSION: 1. Cephalization of blood flow suggesting pulmonary venous hypertension. Upper normal heart size. 2. Subsegmental atelectasis along the left hemidiaphragm. 3.  Aortic Atherosclerosis (ICD10-I70.0). Electronically Signed   By: Walter  Liebkemann M.D.   On: 09/01/2021 08:17   DG HIP UNILAT WITH PELVIS 2-3 VIEWS RIGHT  Result Date: 09/01/2021 CLINICAL DATA:  Fell 4 days ago with continued right hip pain. Right hip replacement 2012, revision 2014. EXAM: DG HIP (WITH OR WITHOUT PELVIS) 2-3V RIGHT COMPARISON:  Recent CT of abdomen and pelvis and reconstructions of 08/25/2021. FINDINGS: There is no evidence of hip fracture or dislocation. Right hip total joint arthroplasty appears well seated. Bone mineralization is osteopenic. There is mild arthrosis of the left hip joint. Both SI joints and pubic symphysis are unremarkable, as visualized. There are patchy calcifications in the right femoral artery. Artifact from overlying clothing. Soft tissues are otherwise unremarkable. IMPRESSION: Right hip arthroplasty.  Osteopenia without evidence of fractures. Electronically Signed   By: Keith  Chesser M.D.   On: 09/01/2021 05:33   CT HEAD WO CONTRAST (5MM)  Result Date: 09/01/2021 CLINICAL DATA:  Initial evaluation for head trauma. EXAM: CT HEAD WITHOUT CONTRAST TECHNIQUE:  Contiguous axial images were obtained from the base of the skull through the vertex without intravenous contrast. RADIATION DOSE REDUCTION: This exam was performed according to the departmental dose-optimization program which includes automated exposure control, adjustment of the mA and/or kV according to patient size and/or use of iterative reconstruction technique. COMPARISON:  Prior CT from 01/22/2015. FINDINGS: Brain: Mild age-related cerebral atrophy. No acute intracranial hemorrhage. No acute large vessel territory infarct. No mass lesion, mass effect, or midline shift. No hydrocephalus or extra-axial fluid collection. Vascular: No hyperdense vessel. Skull: Scalp soft tissues demonstrate no acute finding. Calvarium intact. Osteopenia noted. Sinuses/Orbits: Globes and orbital soft tissues demonstrate no acute finding. Paranasal sinuses are largely clear. No mastoid effusion. Other: None. IMPRESSION: 1. No acute intracranial abnormality. 2. Mild age-related cerebral atrophy. Electronically Signed   By: Benjamin  McClintock M.D.   On: 09/01/2021 03:13   DG Chest Port 1 View  Result Date: 08/31/2021 CLINICAL DATA:  Hypotension, possible sepsis EXAM: PORTABLE CHEST 1 VIEW COMPARISON:  Portable exam 1559 hours compared to 08/25/2021 FINDINGS: RIGHT jugular Port-A-Cath with tip projecting over SVC. Upper normal heart size. Mediastinal contours and pulmonary vascularity normal. Lungs clear. No pulmonary infiltrate, pleural effusion, or pneumothorax. Bones demineralized. IMPRESSION: No acute abnormalities. Electronically Signed   By: Mark  Boles M.D.   On: 08/31/2021 16:08   CT ABDOMEN PELVIS WO CONTRAST  Result Date: 08/25/2021 CLINICAL DATA:  Splenomegaly.  Dizziness.  History of leukemia. EXAM: CT ABDOMEN AND PELVIS WITHOUT CONTRAST TECHNIQUE: Multidetector CT imaging of the abdomen and pelvis was performed following the standard protocol without IV contrast. RADIATION DOSE REDUCTION: This exam was performed  according to the departmental dose-optimization program which includes automated exposure control, adjustment of the mA and/or kV according to patient size and/or use of iterative reconstruction technique. COMPARISON:  04/07/2021 FINDINGS: Lower chest: Lung bases are clear except for minimal linear atelectasis or scarring. Hepatobiliary: Liver appears normal without contrast. Previous cholecystectomy. Pancreas: Normal Spleen: Spleen measures 13.9 by 10.4 x 5.4 cm today compared with 14.3 by 10.5 by 5.6 cm previously. Splenic index is 781. Splenic index greater than 480 suggests splenomegaly. Estimated splenic volume is 483 cc. Adrenals/Urinary Tract: Adrenal glands are normal. Kidneys are normal. Bladder is normal. Some limitation in evaluation due to beam hardening from the   right hip replacement. Stomach/Bowel: Stomach and small intestine are normal. Large amount of fecal matter in the colon but without evidence of obstruction or inflammation. Vascular/Lymphatic: Aortic atherosclerosis. No aneurysm. IVC is normal. No adenopathy. Reproductive: Previous hysterectomy.  No pelvic mass. Other: No free fluid or air. Musculoskeletal: Ordinary lower lumbar degenerative changes. Hip replacement on the right. Some osteoarthritis of the left hip. IMPRESSION: No acute organ finding. Splenomegaly as seen on the prior exam. Splenic index is 781. Estimated splenic volume is 483 cc. Aortic Atherosclerosis (ICD10-I70.0). Electronically Signed   By: Mark  Shogry M.D.   On: 08/25/2021 14:42   DG Chest Port 1 View  Result Date: 08/25/2021 CLINICAL DATA:  Dizziness, sepsis.  April 16, 2020. EXAM: PORTABLE CHEST 1 VIEW COMPARISON:  None Available. FINDINGS: The heart size and mediastinal contours are within normal limits. Both lungs are clear. Right internal jugular Port-A-Cath is unchanged in position. The visualized skeletal structures are unremarkable. IMPRESSION: No active disease. Electronically Signed   By: James  Green Jr  M.D.   On: 08/25/2021 12:58     ASSESSMENT:  Leukocytosis with left shift: - CBC on 11/06/2020 with white count 75.6, differential 59% neutrophils, 12% monocytes, 4% lymphocytes, 1 percentage of eosinophils and basophils, 6% band neutrophils, 12% myelocytes, 1% promyelocytes, 29% blasts. - Pathologist review of blood smear reported as leukoerythroblastic reaction. - 25 pound weight loss in the last 6 months, unintentional.  Decreased appetite.  Reports fatigue for the last few months.  Reports night sweats x3 in the last 1 month. - Bone marrow biopsy on 11/18/2020 consistent with granulocytic proliferation with differential including CMML versus myeloproliferative disorder. - BCR/ABL was negative. 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 - NGS myeloid panel shows mutations in ASXL1, MPL, TET2, EZH2 mutations. - Spleen ultrasound on 12/16/2020 shows mildly enlarged measuring 12.4 x 9.6 x 7 cm with volume of 435 cc. - PDGFR alpha, beta and FGFR 1 was negative. - She was evaluated by Dr. Powell at [facility].  Slides were reviewed at [facility] hematopathology.  They thought it was less likely CMML and more likely MPL positive myeloproliferative neoplasm.  Dr. Powell has recommended initiate Jakafi. - Ruxolitinib 10 mg twice daily started on 02/16/2021.  Dose increased to 15 mg twice daily on 05/18/2021. - CTAP on 04/07/2021: Hepatomegaly and splenomegaly measuring 14.3 cm.  No other pathology.  No spleen infarcts.  2.  Social/family history: - She lives at home and is able to do all her ADLs and IADLs although she is getting tired lately.  She reports quitting smoking 6 months ago and smoked 1 pack/week for 43 years. - She believes that her mother had some kind of leukemia.  No other malignancies.   PLAN:  MPL positive myeloproliferative neoplasm: - She was recently hospitalized with fever. - She received broad-spectrum antibiotics.  We could not isolate the source  of infection.  She had CT scan of the chest which did not reveal any possible source. - Overall she is feeling much better since discharge.  CBC today shows hemoglobin 9.3. - She reports having diarrhea a day and night. - She also reports cough which starts typically in the evening around 5 PM and continues until morning.  Recommend adding Pepcid 20 mg around 4 PM.  She is already taking Protonix 40 mg daily which does not always help control symptoms. - She also reported ankle swelling since the HCTZ discontinued and metoprolol started for blood pressure by Dr.   Simpson. - As her white count has gone up to 134, recommend C. difficile testing. - We have also cut back on dose of ruxolitinib to 15 mg once daily during hospitalization for better tolerance.  We will keep it at the same dose right now and plan to increase it at next visit. - I have also recommended doing a bone marrow aspiration and biopsy to evaluate response.  2.  Decreased appetite: - Weight is more or less stable since she was discharged home.  3.  CKD: - Baseline creatinine between 1.3-1.5.  Today creatinine 1.19.  4.  Hyperuricemia: - Continue allopurinol 300 mg daily.  5.  Hypokalemia: - Continue potassium supplements.  Potassium today is normal.   Orders placed this encounter:  No orders of the defined types were placed in this encounter.    Derek Jack, MD [place] [phone]   I, Thana Ates, am acting as a scribe for Dr. Derek Jack.  I, Derek Jack MD, have reviewed the above documentation for accuracy and completeness, and I agree with the above.

## 2021-09-06 NOTE — Progress Notes (Signed)
FMLA for pt's daughter Renee Waters faxed to the daughter's workplace per her request.  Fax confirmation received.

## 2021-09-06 NOTE — Telephone Encounter (Signed)
Spoke to patient did TOC call.

## 2021-09-06 NOTE — Telephone Encounter (Signed)
Transition Care Management Follow-up Telephone Call Date of discharge and from where: 09/03/2021 [name] How have you been since you were released from the hospital? Has been having diarrhea, feeling sluggish, and fatigued.  Any questions or concerns? No  Items Reviewed: Did the pt receive and understand the discharge instructions provided? Yes  Medications obtained and verified? Yes  Other? No  Any new allergies since your discharge? No  Dietary orders reviewed? Yes Do you have support at home? Yes   Home Care and Equipment/Supplies: Were home health services ordered? yes If so, what is the name of the agency? [facility]  Has the agency set up a time to come to the patient's home? yes Were any new equipment or medical supplies ordered?  No What is the name of the medical supply agency? N/a Were you able to get the supplies/equipment? not applicable Do you have any questions related to the use of the equipment or supplies? No  Functional Questionnaire: (I = Independent and D = Dependent) ADLs: I   Bathing/Dressing- I  Meal Prep- i  Eating- i  Maintaining continence- i  Transferring/Ambulation- i  Managing Meds- i  Follow up appointments reviewed:  PCP Hospital f/u appt confirmed? Yes  Scheduled to see Alvira Monday on 09/10/2021 @ [facility] f/u appt confirmed? No  Are transportation arrangements needed? No  If their condition worsens, is the pt aware to call PCP or go to the Emergency Dept.? yes Was the patient provided with contact information for the PCP's office or ED? Yes Was to pt encouraged to call back with questions or concerns? Yes

## 2021-09-06 NOTE — Telephone Encounter (Signed)
Pt called stating she was discharged on 09/03/2021. She is scheduled for Friday 09/10/2021 because is going out of town 8/5-8/12. Can you please do TOC call?   Pt also states she has had diarrhea every since she left the hospital.

## 2021-09-07 ENCOUNTER — Other Ambulatory Visit: Payer: Self-pay

## 2021-09-07 DIAGNOSIS — R0602 Shortness of breath: Secondary | ICD-10-CM | POA: Insufficient documentation

## 2021-09-07 DIAGNOSIS — Z8249 Family history of ischemic heart disease and other diseases of the circulatory system: Secondary | ICD-10-CM | POA: Diagnosis not present

## 2021-09-07 DIAGNOSIS — M2550 Pain in unspecified joint: Secondary | ICD-10-CM | POA: Insufficient documentation

## 2021-09-07 DIAGNOSIS — D471 Chronic myeloproliferative disease: Secondary | ICD-10-CM | POA: Insufficient documentation

## 2021-09-07 DIAGNOSIS — K219 Gastro-esophageal reflux disease without esophagitis: Secondary | ICD-10-CM | POA: Diagnosis not present

## 2021-09-07 DIAGNOSIS — E876 Hypokalemia: Secondary | ICD-10-CM | POA: Diagnosis not present

## 2021-09-07 DIAGNOSIS — I700 Atherosclerosis of aorta: Secondary | ICD-10-CM | POA: Diagnosis not present

## 2021-09-07 DIAGNOSIS — Z823 Family history of stroke: Secondary | ICD-10-CM | POA: Diagnosis not present

## 2021-09-07 DIAGNOSIS — F419 Anxiety disorder, unspecified: Secondary | ICD-10-CM | POA: Insufficient documentation

## 2021-09-07 DIAGNOSIS — R197 Diarrhea, unspecified: Secondary | ICD-10-CM | POA: Diagnosis not present

## 2021-09-07 DIAGNOSIS — Z87891 Personal history of nicotine dependence: Secondary | ICD-10-CM | POA: Insufficient documentation

## 2021-09-07 DIAGNOSIS — I10 Essential (primary) hypertension: Secondary | ICD-10-CM | POA: Diagnosis not present

## 2021-09-07 DIAGNOSIS — J439 Emphysema, unspecified: Secondary | ICD-10-CM | POA: Insufficient documentation

## 2021-09-07 DIAGNOSIS — Z79899 Other long term (current) drug therapy: Secondary | ICD-10-CM | POA: Insufficient documentation

## 2021-09-07 LAB — C DIFFICILE QUICK SCREEN W PCR REFLEX
C Diff antigen: NEGATIVE
C Diff interpretation: NOT DETECTED
C Diff toxin: NEGATIVE

## 2021-09-07 NOTE — Telephone Encounter (Signed)
Please advise toc scheduled 8/4 but Dr Griffin Dakin pt message sent to me

## 2021-09-08 ENCOUNTER — Other Ambulatory Visit: Payer: Self-pay | Admitting: Internal Medicine

## 2021-09-08 ENCOUNTER — Other Ambulatory Visit: Payer: Self-pay | Admitting: *Deleted

## 2021-09-08 DIAGNOSIS — D471 Chronic myeloproliferative disease: Secondary | ICD-10-CM

## 2021-09-08 NOTE — Patient Outreach (Signed)
  Care Coordination   09/08/2021  Name: Renee Waters MRN: [id number] DOB: 04-Sep-1945   Care Coordination Outreach Attempts:  An unsuccessful telephone outreach was attempted today to offer the patient information about available care coordination services as a benefit of their health plan. HIPAA compliant messages left on voicemail for patient, providing contact information, encouraging patient to return CSW's call at her earliest convenience.  Follow Up Plan:  Additional outreach attempts will be made to offer the patient care coordination information and services.   Encounter Outcome:  No Answer.   Care Coordination Interventions Activated:  No    Care Coordination Interventions:  No, not indicated.    Nat Christen, BSW, MSW, LCSW  Licensed Education officer, [employer]  Mailing [place] [address], [place], [place] [address] Physical Address-[address], [place], [place] [address] Toll Free Main # [phone] Fax # [phone] Cell # [phone] [email or web address]

## 2021-09-09 ENCOUNTER — Ambulatory Visit
Admission: RE | Admit: 2021-09-09 | Discharge: 2021-09-09 | Disposition: A | Discharge status: Home / Self Care | Payer: Medicare Other | Source: Ambulatory Visit | Attending: Hematology | Admitting: Hematology

## 2021-09-09 DIAGNOSIS — D6489 Other specified anemias: Secondary | ICD-10-CM | POA: Diagnosis not present

## 2021-09-09 DIAGNOSIS — D649 Anemia, unspecified: Secondary | ICD-10-CM | POA: Diagnosis not present

## 2021-09-09 DIAGNOSIS — I129 Hypertensive chronic kidney disease with stage 1 through stage 4 chronic kidney disease, or unspecified chronic kidney disease: Secondary | ICD-10-CM | POA: Insufficient documentation

## 2021-09-09 DIAGNOSIS — D471 Chronic myeloproliferative disease: Secondary | ICD-10-CM | POA: Diagnosis not present

## 2021-09-09 DIAGNOSIS — D72829 Elevated white blood cell count, unspecified: Secondary | ICD-10-CM | POA: Insufficient documentation

## 2021-09-09 DIAGNOSIS — Z31438 Encounter for other genetic testing of female for procreative management: Secondary | ICD-10-CM | POA: Insufficient documentation

## 2021-09-09 DIAGNOSIS — N183 Chronic kidney disease, stage 3 unspecified: Secondary | ICD-10-CM | POA: Diagnosis not present

## 2021-09-09 LAB — GI PATHOGEN PANEL BY PCR, STOOL

## 2021-09-09 LAB — CBC WITH DIFFERENTIAL/PLATELET
Abs Immature Granulocytes: 35.83 10*3/uL — ABNORMAL HIGH (ref 0.00–0.07)
Basophils Absolute: 0.8 10*3/uL — ABNORMAL HIGH (ref 0.0–0.1)
Basophils Relative: 1 %
Eosinophils Absolute: 0.4 10*3/uL (ref 0.0–0.5)
Eosinophils Relative: 0 %
HCT: 27.6 % — ABNORMAL LOW (ref 36.0–46.0)
Hemoglobin: 9.1 g/dL — ABNORMAL LOW (ref 12.0–15.0)
Immature Granulocytes: 29 %
Lymphocytes Relative: 2 %
Lymphs Abs: 2.5 10*3/uL (ref 0.7–4.0)
MCH: 30.4 pg (ref 26.0–34.0)
MCHC: 33 g/dL (ref 30.0–36.0)
MCV: 92.3 fL (ref 80.0–100.0)
Monocytes Absolute: 9 10*3/uL — ABNORMAL HIGH (ref 0.1–1.0)
Monocytes Relative: 7 %
Neutro Abs: 76.7 10*3/uL — ABNORMAL HIGH (ref 1.7–7.7)
Neutrophils Relative %: 61 %
Platelets: 214 10*3/uL (ref 150–400)
RBC: 2.99 MIL/uL — ABNORMAL LOW (ref 3.87–5.11)
RDW: 22 % — ABNORMAL HIGH (ref 11.5–15.5)
Smear Review: NORMAL
WBC: 125.4 10*3/uL (ref 4.0–10.5)
nRBC: 2.1 % — ABNORMAL HIGH (ref 0.0–0.2)

## 2021-09-09 LAB — PATHOLOGIST SMEAR REVIEW

## 2021-09-09 MED ORDER — FENTANYL CITRATE (PF) 100 MCG/2ML IJ SOLN
INTRAMUSCULAR | Status: AC
  Filled 2021-09-09: qty 2

## 2021-09-09 MED ORDER — HEPARIN SOD (PORK) LOCK FLUSH 100 UNIT/ML IV SOLN
INTRAVENOUS | Status: AC
  Filled 2021-09-09: qty 5

## 2021-09-09 MED ORDER — MIDAZOLAM HCL 2 MG/2ML IJ SOLN
INTRAMUSCULAR | Status: AC | PRN
  Administered 2021-09-09: 1 mg via INTRAVENOUS

## 2021-09-09 MED ORDER — FENTANYL CITRATE (PF) 100 MCG/2ML IJ SOLN
INTRAMUSCULAR | Status: AC | PRN
  Administered 2021-09-09 (×2): 50 ug via INTRAVENOUS

## 2021-09-09 MED ORDER — SODIUM CHLORIDE 0.9 % IV SOLN: INTRAVENOUS | Status: DC

## 2021-09-09 MED ORDER — MIDAZOLAM HCL 2 MG/2ML IJ SOLN
INTRAMUSCULAR | Status: AC
  Filled 2021-09-09: qty 2

## 2021-09-09 MED ORDER — MIDAZOLAM HCL 5 MG/5ML IJ SOLN
INTRAMUSCULAR | Status: AC | PRN
  Administered 2021-09-09: 1 mg via INTRAVENOUS

## 2021-09-09 NOTE — Progress Notes (Signed)
Patient clinically stable post BMB per DR [facility], vitals stable pre and post procedure. Received Versed 2 mg along with Fentanyl 100 mcg IV for procedure. Patient tearful prior and during procedure, stating last BMB tramatic, despite meds given and 8 minute procedure this procedure. Appeared to do well with stable vital signs. Report given to [facility] RN post procedure/specials. Discharge instructions given to patient and husband post procedure at bedside.

## 2021-09-09 NOTE — H&P (Signed)
Chief Complaint: Patient was seen in consultation today for bone marrow biopsy at the request of [place]  Referring Physician(s): Katragadda,Sreedhar  Supervising Physician: Daryll Brod  Patient Status: [facility] - Out-pt  History of Present Illness: Renee Waters is a 76 y.o. female with PMHx significant for CKD, leukocytosis with left shift and MPL positive myeloproliferative neoplasm who follows closely with hematology/oncology. She is scheduled today for follow-up image guided bone marrow biopsy to evaluate response to treatment. She has previously had a bone marrow biopsy last year.   The patient denies any current chest pain or shortness of breath. She has no known complications to sedation and tolerated her bone marrow biopsy procedure well last year.   Past Medical History:  Diagnosis Date   Acute cholangitis    Allergy    Anemia    Anxiety    Arthritis    Phreesia 03/18/2020   Barrett's esophagus    Cataract    Chronic back pain    Chronic neck pain    CKD (chronic kidney disease) stage 3, GFR 30-59 ml/min ([place]) 10/29/2020   Depression    Genital herpes    GERD (gastroesophageal reflux disease)    H/O degenerative disc disease    History of blood transfusion    Hypertension    Insomnia    Lupus (systemic lupus erythematosus) ([place])    Neuromuscular disorder ([place])    Osteoarthritis    S/P colonoscopy June 2005   normal, no polyps   S/P endoscopy June 2005, Oct 2009   2005: short-segment Barrett's, 2009: short-segment Barrett's   Upper respiratory tract infection 07/09/2020   UTI (lower urinary tract infection) 11/2012    Past Surgical History:  Procedure Laterality Date   ABDOMINAL HYSTERECTOMY     BACK SURGERY     BIOPSY N/A 03/20/2014   Procedure: BIOPSY;  Surgeon: Daneil Dolin, MD;  Location: [facility];  Service: Endoscopy;  Laterality: N/A;   BIOPSY  09/14/2015   Procedure: BIOPSY;  Surgeon: Daneil Dolin, MD;  Location: [facility];   Service: Endoscopy;;  esophageal and gastric   BIOPSY  07/23/2019   Procedure: BIOPSY;  Surgeon: Daneil Dolin, MD;  Location: [facility];  Service: Endoscopy;;  esophageal    CARPAL TUNNEL RELEASE Left 2013   cervical disectomy  2002   CESAREAN SECTION N/A    Phreesia 03/18/2020   CHOLECYSTECTOMY     with lysis of adhesions for sbo; "ruptured gallbladder".   COLONOSCOPY  11/09/2011   RMR: Melanosis coli   COLONOSCOPY WITH PROPOFOL N/A 09/14/2015   Dr. Gala Romney: diverticulosis, 32m TA removed. next TCS 09/2020.    COLONOSCOPY WITH PROPOFOL N/A 04/30/2020   Procedure: COLONOSCOPY WITH PROPOFOL;  Surgeon: RDaneil Dolin MD;  [facility]: [facility];  Service: Endoscopy;  Laterality: N/A;  PM (ASA 3)   DENTAL SURGERY  11/2015   multiple tooth extraction   ESOPHAGOGASTRODUODENOSCOPY  11/29/2007   salmon-colored  tongue   longest stable at  3 cm, distal esophagus as described previously status post biopsy/ Hiatal hernia, otherwise normal stomach D1 and D2   ESOPHAGOGASTRODUODENOSCOPY  01/06/11   short segment Barrett's esophagus s/p bx/Hiatal hernia   ESOPHAGOGASTRODUODENOSCOPY (EGD) WITH PROPOFOL N/A 03/20/2014   RWVP:XTGGYIRSdistal esophagus short segment barrett's, bx with no dysplasia. next egd in 03/2017   ESOPHAGOGASTRODUODENOSCOPY (EGD) WITH PROPOFOL N/A 09/14/2015   Dr. RGala Romney Barrett's without dysplasia, gastritis benign bx, hiatal hernia. next EGD 09/2018.   ESOPHAGOGASTRODUODENOSCOPY (EGD) WITH PROPOFOL N/A  07/23/2019   Procedure: ESOPHAGOGASTRODUODENOSCOPY (EGD) WITH PROPOFOL;  Surgeon: Daneil Dolin, MD;  [facility];  Service: Endoscopy;  Laterality: N/A;  3:00pm   EYE SURGERY N/A    Phreesia 03/18/2020   HERNIA REPAIR Right 07/2010   Dr. Zada Girt   IR IMAGING GUIDED PORT INSERTION  02/09/2021   JOINT REPLACEMENT     LAPAROSCOPIC CHOLECYSTECTOMY  2017   at [facility]   POLYPECTOMY  09/14/2015   Procedure: POLYPECTOMY;  Surgeon: Daneil Dolin, MD;  Location: [facility];   Service: Endoscopy;;  ascending colon   right hip replacement  07/2010   went back in sept 2012 to fix   SHOULDER ARTHROSCOPY  2008   left   SPINE SURGERY N/A    Phreesia 03/18/2020   TOTAL HIP REVISION Right 12/17/2012   Procedure: RIGHT TOTAL HIP REVISION;  Surgeon: Mauri Pole, MD;  Location: [facility];  Service: Orthopedics;  Laterality: Right;   WRIST SURGERY Right 2011   open reduction right wrist.    Allergies: Bee venom, Acetaminophen, Amlodipine, Tyloxapol, Other, and Oxycodone-acetaminophen  Medications: Prior to Admission medications   Medication Sig Start Date End Date Taking? Authorizing Provider  acyclovir ointment (ZOVIRAX) 5 % Apply 1 application. topically every 3 (three) hours. 04/21/21  Yes [name], MD  allopurinol (ZYLOPRIM) 300 MG tablet Take 1 tablet by mouth once daily 08/30/21  Yes Derek Jack, MD  citalopram (CELEXA) 20 MG tablet Take 1 tablet by mouth once daily 08/30/21  Yes [name], MD  furosemide (LASIX) 20 MG tablet Take 1 tablet (20 mg total) by mouth daily as needed. 09/06/21  Yes Derek Jack, MD  gabapentin (NEURONTIN) 300 MG capsule Take one capsule by mouth once daily, as needed, for pain 03/30/21  Yes [name], MD  JAKAFI 15 MG tablet TAKE 1 TABLET (15MG) BY MOUTH TWICE A DAY 09/05/21  Yes Derek Jack, MD  lidocaine-prilocaine (EMLA) cream Apply 1 application. topically as needed. 06/18/21  Yes Derek Jack, MD  losartan (COZAAR) 25 MG tablet Take 1 tablet by mouth once daily for blood pressure 04/26/21  Yes [name], MD  meloxicam (MOBIC) 7.5 MG tablet Take 1 tablet (7.5 mg total) by mouth daily as needed for pain (knee pain). 01/28/21  Yes Carole Civil, MD  metoprolol tartrate (LOPRESSOR) 25 MG tablet Take 1 tablet (25 mg total) by mouth 2 (two) times daily. 08/28/21  Yes Johnson, Clanford L, MD  mirtazapine (REMERON) 15 MG tablet Take 1 tablet (15 mg total) by mouth at  bedtime. 06/07/21  Yes [name], MD  oxyCODONE (OXY IR/ROXICODONE) 5 MG immediate release tablet Take 5 mg by mouth every 4 (four) hours as needed. 04/22/21  Yes [provider]  pantoprazole (PROTONIX) 40 MG tablet TAKE 1 TABLET BY MOUTH ONCE DAILY BEFORE BREAKFAST 08/30/21  Yes Mahon, Lenise Arena, NP  potassium chloride (KLOR-CON) 10 MEQ tablet Take 1 tablet by mouth once daily 08/06/21  Yes Derek Jack, MD  temazepam (RESTORIL) 30 MG capsule Take 1 capsule (30 mg total) by mouth at bedtime as needed for sleep. 03/09/21  Yes [name], MD  acyclovir (ZOVIRAX) 800 MG tablet Take 1 tablet (800 mg total) by mouth 5 (five) times daily. Patient taking differently: Take 400 mg by mouth 3 (three) times daily. 04/21/21   [name], MD  EPINEPHRINE 0.3 mg/0.3 mL IJ SOAJ injection INJECT 0.3 MLS INTO MUSCLE ONCE AS NEEDED Patient taking differently:  Inject 0.3 mg into the muscle as needed for anaphylaxis. 10/04/16   [name], MD  tiZANidine (ZANAFLEX) 4 MG tablet Take 8 mg by mouth 3 (three) times daily as needed (spasms). 08/05/19   [name], MD     Family History  Problem Relation Age of Onset   Hypertension Mother    Stroke Mother    Colon cancer Neg Hx    Anesthesia problems Neg Hx    Hypotension Neg Hx    Malignant hyperthermia Neg Hx    Pseudochol deficiency Neg Hx    Gastric cancer Neg Hx    Esophageal cancer Neg Hx     Social History   Socioeconomic History   Marital status: Married    Spouse name: louis   Number of children: 4   Years of education: 12+   Highest education level: Some college, no degree  Occupational History   Occupation: Press photographer  - retired   Occupation: non profit  Tobacco Use   Smoking status: Former    Packs/day: 0.25    Years: 25.00    Total pack years: 6.25    Types: Cigarettes    Quit date: 02/07/2003    Years since quitting: 18.6   Smokeless tobacco: Never   Tobacco comments:    quit  in 2004  Vaping Use   Vaping Use: Never used  Substance and Sexual Activity   Alcohol use: No   Drug use: No   Sexual activity: Not Currently    Birth control/protection: Surgical  Other Topics Concern   Not on file  Social History Narrative   Not on file   Social Determinants of Health   Financial Resource Strain: Low Risk  (12/28/2020)   Overall Financial Resource Strain (CARDIA)    Difficulty of Paying Living Expenses: Not hard at all  Food Insecurity: No Food Insecurity (12/28/2020)   Hunger Vital Sign    Worried About Running Out of Food in the Last Year: Never true    [place] in the Last Year: Never true  Transportation Needs: No Transportation Needs (12/28/2020)   PRAPARE - Hydrologist (Medical): No    Lack of Transportation (Non-Medical): No  Recent Concern: Transportation Needs - Unmet Transportation Needs (10/13/2020)   PRAPARE - Transportation    Lack of Transportation (Medical): Yes    Lack of Transportation (Non-Medical): Yes  Physical Activity: Inactive (04/23/2021)   Exercise Vital Sign    Days of Exercise per Week: 0 days    Minutes of Exercise per Session: 0 min  Stress: Stress Concern Present (04/23/2021)   [place]    Feeling of Stress : Rather much  Social Connections: Moderately Integrated (12/28/2020)   Social Connection and Isolation Panel [NHANES]    Frequency of Communication with Friends and Family: More than three times a week    Frequency of Social Gatherings with Friends and Family: Once a week    Attends Religious Services: More than 4 times per year    Active Member of [employer] or Organizations: No    Attends Archivist Meetings: Never    Marital Status: Married    Review of Systems: A 12 point ROS discussed and pertinent positives are indicated in the HPI above.  All other systems are negative.  Review of Systems  Vital  Signs: BP (!) 148/4   Pulse 60   Temp 98.4 F (36.9 C) (Oral)  Resp 20   Ht 5' 5"  (1.651 m)   Wt 130 lb 1.1 oz (59 kg)   SpO2 94%   BMI 21.64 kg/m   Physical Exam Constitutional:      Appearance: Normal appearance.  HENT:     Head: Normocephalic and atraumatic.  Cardiovascular:     Rate and Rhythm: Normal rate and regular rhythm.  Pulmonary:     Effort: Pulmonary effort is normal. No respiratory distress.  Neurological:     Mental Status: She is alert and oriented to person, place, and time.     Imaging: DG CHEST PORT 1 VIEW  Result Date: 09/02/2021 CLINICAL DATA:  Hypotension EXAM: PORTABLE CHEST 1 VIEW COMPARISON:  Previous studies including the examination of 09/01/2021 FINDINGS: Transverse diameter heart is increased. Thoracic aorta is tortuous and ectatic. Central pulmonary vessels are slightly less prominent. There are no signs of alveolar pulmonary edema. Small patchy infiltrate is seen in the lower lung fields. Costophrenic angles are clear. There is faint air soft tissue interface in the lateral aspect of right mid lung field. This may be an artifact due to skin fold. Possibility of small pneumothorax is not excluded. Tip of right IJ chest port is seen in superior vena cava. IMPRESSION: There is faint area soft tissue interface in the lateral aspect of right mid lung field, possibly an artifact due to skin fold. Less likely possibility would be small right pneumothorax. Repeat chest radiographs should be considered. Cardiomegaly. Central pulmonary vessels are slightly less prominent. There are no signs of alveolar pulmonary edema or focal pulmonary consolidation. There are small linear densities in both lower lung fields suggesting subsegmental atelectasis. Electronically Signed   By: Elmer Picker M.D.   On: 09/02/2021 16:08   CT CHEST WO CONTRAST  Result Date: 09/01/2021 CLINICAL DATA:  Hematologic malignancy, monitor To evaluate for atypical infections in  immunocompromised patient with cough EXAM: CT CHEST WITHOUT CONTRAST TECHNIQUE: Multidetector CT imaging of the chest was performed following the standard protocol without IV contrast. RADIATION DOSE REDUCTION: This exam was performed according to the departmental dose-optimization program which includes automated exposure control, adjustment of the mA and/or kV according to patient size and/or use of iterative reconstruction technique. COMPARISON:  Chest x-ray 08/25/2021, chest x-ray 09/01/2021 FINDINGS: Cardiovascular: Right chest wall port catheter tip terminating just distal to the superior cavoatrial junction. Normal heart size. No significant pericardial effusion. The thoracic aorta is normal in caliber. Mild atherosclerotic plaque of the thoracic aorta. No coronary artery calcifications. Mediastinum/Nodes: No gross hilar adenopathy, noting limited sensitivity for the detection of hilar adenopathy on this noncontrast study. No enlarged mediastinal or axillary lymph nodes. Thyroid gland, trachea, and esophagus demonstrate no significant findings. Lungs/Pleura: Mild to moderate paraseptal and centrilobular emphysematous changes. No focal consolidation. No pulmonary nodule. No pulmonary mass. Bilateral trace pleural effusions. No pneumothorax. Upper Abdomen: No acute abnormality. Musculoskeletal: No chest wall abnormality. No suspicious lytic or blastic osseous lesions. No acute displaced fracture. Multilevel degenerative changes of the spine. IMPRESSION: 1. Bilateral trace pleural effusions. 2. Aortic Atherosclerosis (ICD10-I70.0) and Emphysema (ICD10-J43.9). Electronically Signed   By: Iven Finn M.D.   On: 09/01/2021 22:52   DG CHEST PORT 1 VIEW  Result Date: 09/01/2021 CLINICAL DATA:  Acute kidney injury and hypotension.  SIRS. EXAM: PORTABLE CHEST 1 VIEW COMPARISON:  08/31/2021 FINDINGS: Power injectable right Port-A-Cath tip: Lower SVC. Atherosclerotic calcification of the aortic arch. Upper normal  heart size. Cephalization of blood flow on this mostly upright image, raising the  possibility of pulmonary venous hypertension. Linear subsegmental atelectasis or scarring along the left hemidiaphragm. IMPRESSION: 1. Cephalization of blood flow suggesting pulmonary venous hypertension. Upper normal heart size. 2. Subsegmental atelectasis along the left hemidiaphragm. 3.  Aortic Atherosclerosis (ICD10-I70.0). Electronically Signed   By: Van Clines M.D.   On: 09/01/2021 08:17   DG HIP UNILAT WITH PELVIS 2-3 VIEWS RIGHT  Result Date: 09/01/2021 CLINICAL DATA:  [place] 4 days ago with continued right hip pain. Right hip replacement 2012, revision 2014. EXAM: DG HIP (WITH OR WITHOUT PELVIS) 2-3V RIGHT COMPARISON:  Recent CT of abdomen and pelvis and reconstructions of 08/25/2021. FINDINGS: There is no evidence of hip fracture or dislocation. Right hip total joint arthroplasty appears well seated. Bone mineralization is osteopenic. There is mild arthrosis of the left hip joint. Both SI joints and pubic symphysis are unremarkable, as visualized. There are patchy calcifications in the right femoral artery. Artifact from overlying clothing. Soft tissues are otherwise unremarkable. IMPRESSION: Right hip arthroplasty.  Osteopenia without evidence of fractures. Electronically Signed   By: Telford Nab M.D.   On: 09/01/2021 05:33   CT HEAD WO CONTRAST (5MM)  Result Date: 09/01/2021 CLINICAL DATA:  Initial evaluation for head trauma. EXAM: CT HEAD WITHOUT CONTRAST TECHNIQUE: Contiguous axial images were obtained from the base of the skull through the vertex without intravenous contrast. RADIATION DOSE REDUCTION: This exam was performed according to the departmental dose-optimization program which includes automated exposure control, adjustment of the mA and/or kV according to patient size and/or use of iterative reconstruction technique. COMPARISON:  Prior CT from 01/22/2015. FINDINGS: Brain: Mild age-related  cerebral atrophy. No acute intracranial hemorrhage. No acute large vessel territory infarct. No mass lesion, mass effect, or midline shift. No hydrocephalus or extra-axial fluid collection. Vascular: No hyperdense vessel. Skull: Scalp soft tissues demonstrate no acute finding. Calvarium intact. Osteopenia noted. Sinuses/Orbits: Globes and orbital soft tissues demonstrate no acute finding. Paranasal sinuses are largely clear. No mastoid effusion. Other: None. IMPRESSION: 1. No acute intracranial abnormality. 2. Mild age-related cerebral atrophy. Electronically Signed   By: Jeannine Boga M.D.   On: 09/01/2021 03:13   DG Chest Port 1 View  Result Date: 08/31/2021 CLINICAL DATA:  Hypotension, possible sepsis EXAM: PORTABLE CHEST 1 VIEW COMPARISON:  Portable exam 1559 hours compared to 08/25/2021 FINDINGS: RIGHT jugular Port-A-Cath with tip projecting over SVC. Upper normal heart size. Mediastinal contours and pulmonary vascularity normal. Lungs clear. No pulmonary infiltrate, pleural effusion, or pneumothorax. Bones demineralized. IMPRESSION: No acute abnormalities. Electronically Signed   By: Lavonia Dana M.D.   On: 08/31/2021 16:08   CT ABDOMEN PELVIS WO CONTRAST  Result Date: 08/25/2021 CLINICAL DATA:  Splenomegaly.  Dizziness.  History of leukemia. EXAM: CT ABDOMEN AND PELVIS WITHOUT CONTRAST TECHNIQUE: Multidetector CT imaging of the abdomen and pelvis was performed following the standard protocol without IV contrast. RADIATION DOSE REDUCTION: This exam was performed according to the departmental dose-optimization program which includes automated exposure control, adjustment of the mA and/or kV according to patient size and/or use of iterative reconstruction technique. COMPARISON:  04/07/2021 FINDINGS: Lower chest: Lung bases are clear except for minimal linear atelectasis or scarring. Hepatobiliary: Liver appears normal without contrast. Previous cholecystectomy. Pancreas: Normal Spleen: Spleen  measures 13.9 by 10.4 x 5.4 cm today compared with 14.3 by 10.5 by 5.6 cm previously. Splenic index is 781. Splenic index greater than 480 suggests splenomegaly. Estimated splenic volume is 483 cc. Adrenals/Urinary Tract: Adrenal glands are normal. Kidneys are normal. Bladder is normal. Some  limitation in evaluation due to beam hardening from the right hip replacement. Stomach/Bowel: Stomach and small intestine are normal. Large amount of fecal matter in the colon but without evidence of obstruction or inflammation. Vascular/Lymphatic: Aortic atherosclerosis. No aneurysm. IVC is normal. No adenopathy. Reproductive: Previous hysterectomy.  No pelvic mass. Other: No free fluid or air. Musculoskeletal: Ordinary lower lumbar degenerative changes. Hip replacement on the right. Some osteoarthritis of the left hip. IMPRESSION: No acute organ finding. Splenomegaly as seen on the prior exam. Splenic index is 781. Estimated splenic volume is 483 cc. Aortic Atherosclerosis (ICD10-I70.0). Electronically Signed   By: Nelson Chimes M.D.   On: 08/25/2021 14:42   DG Chest Port 1 View  Result Date: 08/25/2021 CLINICAL DATA:  Dizziness, sepsis.  April 16, 2020. EXAM: PORTABLE CHEST 1 VIEW COMPARISON:  None Available. FINDINGS: The heart size and mediastinal contours are within normal limits. Both lungs are clear. Right internal jugular Port-A-Cath is unchanged in position. The visualized skeletal structures are unremarkable. IMPRESSION: No active disease. Electronically Signed   By: Marijo Conception M.D.   On: 08/25/2021 12:58    Labs:  CBC: Recent Labs    09/02/21 0537 09/03/21 0538 09/06/21 1240 09/09/21 0809  WBC 100.1* 114.1* 134.7* 125.4*  HGB 7.6* 9.1* 9.3* 9.1*  HCT 23.4* 27.6* 28.6* 27.6*  PLT 132* 157 212 214    COAGS: Recent Labs    11/06/20 1919 08/25/21 1233 08/31/21 1547  INR 1.4* 1.6* 1.4*  APTT 36 34 168*    BMP: Recent Labs    09/01/21 0422 09/02/21 0537 09/03/21 0538 09/06/21 1240   NA 142 142 141 141  K 3.7 4.2 3.6 4.3  CL 111 113* 110 113*  CO2 26 25 26 23   GLUCOSE 92 83 95 83  BUN 36* 23 23 24*  CALCIUM 7.9* 8.1* 8.4* 8.1*  CREATININE 1.70* 1.26* 1.14* 1.19*  GFRNONAA 31* 45* 50* 48*    LIVER FUNCTION TESTS: Recent Labs    08/05/21 1422 08/25/21 1233 09/01/21 0422 09/06/21 1240  BILITOT 0.7 0.8 0.9 0.8  AST 38 43* 37 47*  ALT 18 20 18 26   ALKPHOS 157* 137* 143* 187*  PROT 7.3 7.1 6.3* 7.5  ALBUMIN 4.0 3.6 3.1* 3.5     Assessment and Plan: This is a 76 year old female with PMHx significant for leukocytosis with left shift, CKD, and MPL positive myeloproliferative neoplasm who follows closely with hematology/oncology. She is scheduled today for follow-up image guided bone marrow biopsy to evaluate response to treatment. She has previously had a bone marrow biopsy last year.   The patient has been NPO, labs and vitals have been reviewed.  Risks and benefits of image guided bone marrow biopsy with moderate sedation was discussed with the patient and/or patient's family including, but not limited to bleeding, infection, damage to adjacent structures or low yield requiring additional tests.  All of the questions were answered and there is agreement to proceed.  Consent signed and in chart.   Thank you for this interesting consult.  I greatly enjoyed meeting Renee Waters and look forward to participating in their care.  A copy of this report was sent to the requesting provider on this date.  Electronically Signed: Hedy Jacob, PA-C 09/09/2021, 9:09 AM   I spent a total of  10 Minutes in face to face in clinical consultation, greater than 50% of which was counseling/coordinating care for bone marrow biopsy.

## 2021-09-09 NOTE — Procedures (Signed)
Interventional Radiology Procedure Note  Procedure: CT BM ASP AND CORE    Complications: None  Estimated Blood Loss:  MIN  Findings: 11 G CORE AND ASP    M. TREVOR Annaliyah Willig, MD    

## 2021-09-10 ENCOUNTER — Encounter: Payer: Self-pay | Admitting: Nurse Practitioner

## 2021-09-10 ENCOUNTER — Ambulatory Visit (INDEPENDENT_AMBULATORY_CARE_PROVIDER_SITE_OTHER): Payer: Medicare Other | Admitting: Nurse Practitioner

## 2021-09-10 ENCOUNTER — Telehealth: Payer: Self-pay

## 2021-09-10 VITALS — BP 170/70 | HR 68 | Ht 65.0 in | Wt 131.0 lb

## 2021-09-10 DIAGNOSIS — R6510 Systemic inflammatory response syndrome (SIRS) of non-infectious origin without acute organ dysfunction: Secondary | ICD-10-CM | POA: Diagnosis not present

## 2021-09-10 DIAGNOSIS — M25551 Pain in right hip: Secondary | ICD-10-CM

## 2021-09-10 DIAGNOSIS — D471 Chronic myeloproliferative disease: Secondary | ICD-10-CM | POA: Diagnosis not present

## 2021-09-10 DIAGNOSIS — M546 Pain in thoracic spine: Secondary | ICD-10-CM

## 2021-09-10 DIAGNOSIS — I10 Essential (primary) hypertension: Secondary | ICD-10-CM | POA: Diagnosis not present

## 2021-09-10 DIAGNOSIS — N179 Acute kidney failure, unspecified: Secondary | ICD-10-CM | POA: Diagnosis not present

## 2021-09-10 DIAGNOSIS — Z09 Encounter for follow-up examination after completed treatment for conditions other than malignant neoplasm: Secondary | ICD-10-CM

## 2021-09-10 MED ORDER — METOPROLOL TARTRATE 25 MG PO TABS
ORAL_TABLET | ORAL | 1 refills | Status: DC
Start: 2021-09-10 — End: 2021-10-20

## 2021-09-10 NOTE — Patient Instructions (Addendum)
Please start taking metoprolol (one and half pill) 37.'5mg'$  twice daily for your hypertension. Please continue to monitor your blood pressure as you have been doing at home.   It is important that you exercise regularly at least 30 minutes 5 times a week.  Think about what you will eat, plan ahead. Choose " clean, green, fresh or frozen" over canned, processed or packaged foods which are more sugary, salty and fatty. 70 to 75% of food eaten should be vegetables and fruit. Three meals at set times with snacks allowed between meals, but they must be fruit or vegetables. Aim to eat over a 12 hour period , example 7 am to 7 pm, and STOP after  your last meal of the day. Drink water,generally about 64 ounces per day, no other drink is as healthy. Fruit juice is best enjoyed in a healthy way, by EATING the fruit.  Thanks for choosing [facility], we consider it a privelige to serve you.

## 2021-09-10 NOTE — Assessment & Plan Note (Signed)
Follows with [name] Currently on Jakafi 15 mg daily She plans going to [place] for a second opinion for her MPN

## 2021-09-10 NOTE — Assessment & Plan Note (Addendum)
Rated 10/10 today but she has not taking her oxycodone Patient encouraged to take her oxycodone 5 mg every 4 hours as needed when she gets home No Toradol given today due to her CKD Continue Zanaflex 4 mg as needed. Takes meloxicam 7.5 mg as needed.  Patient encouraged to avoid taking meloxicam due to her CKD, she verbalized understanding

## 2021-09-10 NOTE — Assessment & Plan Note (Signed)
Lab Results  Component Value Date   NA 141 09/06/2021   K 4.3 09/06/2021   CO2 23 09/06/2021   GLUCOSE 83 09/06/2021   BUN 24 (H) 09/06/2021   CREATININE 1.19 (H) 09/06/2021   CALCIUM 8.1 (L) 09/06/2021   EGFR 35 (L) 10/21/2020   GFRNONAA 48 (L) 09/06/2021  On admission from 7/25 to 09/03/2021 for AKI Creatinine much improved Avoid nephrotoxic agents Drink at least 64 ounces of water daily

## 2021-09-10 NOTE — Assessment & Plan Note (Signed)
She was treated for pneumonia while in the hospital She has completed course of  Augmentin She denies cough shortness of breath wheezing today, oxygen saturations 90% on room air AKI resolved

## 2021-09-10 NOTE — Assessment & Plan Note (Signed)
BP Readings from Last 3 Encounters:  09/10/21 (!) 170/70  09/09/21 (!) 152/68  09/06/21 (!) 153/74  Currently on metoprolol 25 mg twice daily, losartan 25 mg daily She has taken her medications today She reports that her home blood pressure has been in the 150s over 90s, sometimes better in the 90s Start metoprolol 37.5 mg twice daily, continue losartan 25 mg daily DASH diet advised engage in regular daily exercises as tolerated Follow-up in 4 weeks

## 2021-09-10 NOTE — Telephone Encounter (Signed)
Spoke with Kristin Bruins ok to give verbal. Spoke with pam orders given

## 2021-09-10 NOTE — Assessment & Plan Note (Addendum)
Rated 10/10 today but she has not taking her oxycodone Patient encouraged to take her oxycodone 5 mg every 4 hours as needed when she gets home No Toradol given today due to her CKD Takes meloxicam 7.5 mg as needed.  Patient encouraged to avoid taking meloxicam due to her CKD, she verbalized understanding

## 2021-09-10 NOTE — Telephone Encounter (Signed)
Pam called and left voicemail asked for nurse to give her a call back asap about order treatment for patient from this morning. Call back # [phone]. Pam did not leave where she was calling from.

## 2021-09-10 NOTE — Progress Notes (Signed)
Renee Waters     MRN: [id number]      DOB: 11-21-1945   HPI Renee Waters with past medical history of hypertension, CKD, depression GERD, myeloproliferative neoplasm is here for follow up for hospital admission for AKI . Patient was on admission at the hospital from 08/31/2021 to 09/03/2021 for AKI due to hypotension.  Was given 1 unit of blood, blood pressure was stabilized with use of IV fluids .she was also treated for  pneumonia  with IV antibiotics Augmentin.  She has completed the full course of Augmentin.  She has followed up with [name] her oncologist currently taking Jakafi 101m  tablet by mouth daily.  She initially had diarrhea after taking antibiotics but reports having a formed stool today.   Hypertension.  Patient reports that her blood pressure has been elevated at home.  Currently on metoprolol 25 mg twice daily, losartan 25 mg daily.  She took both medications about an hour ago .patient denies headache, dizziness, chest pain.  She reports that she is in a lot of pain from her chronic right hip pain, thoracic pain.  Takes oxycodone , zanaflex  and meloxicam as needed but she has not take her pain medications today Reports that her pain is a 10/10.    [facility] of her MPN      ROS Denies recent fever or chills. Denies sinus pressure, nasal congestion, ear pain or sore throat. Denies chest congestion, productive cough or wheezing. Denies chest pains, palpitations and leg swelling Denies abdominal pain, nausea, vomiting,diarrhea or constipation.   Denies dysuria, frequency, hesitancy or incontinence. Denies depression, anxiety or insomnia. Denies skin break down or rash.   PE  BP (!) 170/70   Pulse 68   Ht 5' 5" (1.651 m)   Wt 131 lb (59.4 kg)   SpO2 90%   BMI 21.80 kg/m   Patient alert and oriented and in no cardiopulmonary distress.  Chest: Clear to auscultation bilaterally.  CVS: S1, S2 no murmurs,  no S3.Regular rate.  Ext: No edema  MS: decreased ROM spine, shoulders, hips and knees.  Skin: Intact, no ulcerations or rash noted.  Psych: Good eye contact, normal affect. Memory intact not anxious or depressed appearing.  CNS: CN 2-12 intact, power,  normal throughout.no focal deficits noted.   Assessment & Plan  Essential hypertension BP Readings from Last 3 Encounters:  09/10/21 (!) 170/70  09/09/21 (!) 152/68  09/06/21 (!) 153/74  Currently on metoprolol 25 mg twice daily, losartan 25 mg daily She has taken her medications today She reports that her home blood pressure has been in the 150s over 90s, sometimes better in the 90s Start metoprolol 37.5 mg twice daily, continue losartan 25 mg daily DASH diet advised engage in regular daily exercises as tolerated Follow-up in 4 weeks  Hip pain Rated 10/10 today but she has not taking her oxycodone Patient encouraged to take her oxycodone 5 mg every 4 hours as needed when she gets home No Toradol given today due to her CKD Continue Zanaflex 4 mg as needed. Takes meloxicam 7.5 mg as needed.  Patient encouraged to avoid taking meloxicam due to her CKD, she verbalized understanding  Thoracic spine pain Rated 10/10 today but she has not taking her oxycodone Patient encouraged to take her oxycodone 5 mg every 4 hours as needed when she gets home No Toradol given today due to her CKD Takes meloxicam 7.5 mg as  needed.  Patient encouraged to avoid taking meloxicam due to her CKD, she verbalized understanding  AKI (acute kidney injury) [facility]) Lab Results  Component Value Date   NA 141 09/06/2021   K 4.3 09/06/2021   CO2 23 09/06/2021   GLUCOSE 83 09/06/2021   BUN 24 (H) 09/06/2021   CREATININE 1.19 (H) 09/06/2021   CALCIUM 8.1 (L) 09/06/2021   EGFR 35 (L) 10/21/2020   GFRNONAA 48 (L) 09/06/2021  On admission from 7/25 to 09/03/2021 for AKI Creatinine much improved Avoid nephrotoxic agents Drink at least 64 ounces of water  daily  Hospital discharge follow-up Patient was on admission at the hospital from 08/31/2021 to 09/03/2021 for AKI due to hypotension.  She was also thought to have some pneumonia treated with IV antibiotics discharged home on Augmentin which she has completed. She has followed up with [name] her oncologist currently taking Jakafi 49m tablet by mouth daily.   She initially had diarrhea after taking antibiotics but reports having a formed stool today.  Denies any new concerns today except for  her chronic pain Hospital discharge  summary and labs, imaging studies  reviewed today  S[place]ic [place] She was treated for pneumonia while in the hospital She has completed course of  Augmentin She denies cough shortness of breath wheezing today, oxygen saturations 90% on room air AKI resolved  [facility] [place] with Dr. KDelton CoombesCurrently on Jakafi 15 mg daily [facility]

## 2021-09-10 NOTE — Assessment & Plan Note (Addendum)
Patient was on admission at the hospital from 08/31/2021 to 09/03/2021 for AKI due to hypotension.  She was also thought to have some pneumonia treated with IV antibiotics discharged home on Augmentin which she has completed. She has followed up with [name] her oncologist currently taking Jakafi '15mg'$  tablet by mouth daily.   She initially had diarrhea after taking antibiotics but reports having a formed stool today.  Denies any new concerns today except for  her chronic pain Hospital discharge  summary and labs, imaging studies  reviewed today

## 2021-09-13 ENCOUNTER — Telehealth: Payer: Self-pay | Admitting: Family Medicine

## 2021-09-13 ENCOUNTER — Other Ambulatory Visit (HOSPITAL_COMMUNITY): Payer: Medicare Other

## 2021-09-13 ENCOUNTER — Ambulatory Visit: Payer: Medicare Other

## 2021-09-13 NOTE — Telephone Encounter (Signed)
LMTRC

## 2021-09-13 NOTE — Telephone Encounter (Signed)
Erline Levine with [facility], [phone]    Called stating that she is wanting verbal orders for move her physical therapy evaluation to Wednesday?

## 2021-09-14 ENCOUNTER — Telehealth: Payer: Self-pay | Admitting: Family Medicine

## 2021-09-14 ENCOUNTER — Other Ambulatory Visit: Payer: Self-pay | Admitting: Family Medicine

## 2021-09-14 ENCOUNTER — Other Ambulatory Visit: Payer: Self-pay | Admitting: *Deleted

## 2021-09-14 MED ORDER — TEMAZEPAM 30 MG PO CAPS: 30.0000 mg | ORAL_CAPSULE | Freq: Every day | ORAL | 0 refills | Status: DC

## 2021-09-14 NOTE — Patient Outreach (Signed)
  Care Coordination   09/14/2021  Name: Renee Waters MRN: [id number] DOB: 12-12-1945   Care Coordination Outreach Attempts:  A second unsuccessful outreach was attempted today to offer the patient with information about available care coordination services as a benefit of their health plan.   HIPAA compliant messages left on voicemail, providing contact information for CSW, encouraging patient to return the call at their earliest convenience.  Follow Up Plan:  Additional outreach attempts will be made to offer the patient care coordination information and services.   Encounter Outcome:  No Answer.   Care Coordination Interventions Activated:  No    Care Coordination Interventions:  No, not indicated.    Nat Christen, BSW, MSW, LCSW  Licensed Education officer, environmental Health System  Mailing [place] [address], [place], [place] [address] Physical Address-[address], [place], [place] [address] Toll Free Main # [phone] Fax # [phone] Cell # [phone] [email or web address]

## 2021-09-14 NOTE — Telephone Encounter (Signed)
Patient needs refill on   temazepam (RESTORIL) 30 MG capsule   Patient is currently in [facility] , wants to see if script can be sent in to pharmacy close to where she is patient is out of medicine and will be in [facility] until Sunday.  Patient wants a call back.  [facility]

## 2021-09-14 NOTE — Telephone Encounter (Signed)
LVM letting patient know medication sent to pharmacy

## 2021-09-20 ENCOUNTER — Telehealth: Payer: Self-pay | Admitting: Family Medicine

## 2021-09-20 ENCOUNTER — Other Ambulatory Visit (HOSPITAL_COMMUNITY): Payer: Medicare Other

## 2021-09-20 ENCOUNTER — Inpatient Hospital Stay: Payer: Medicare Other | Attending: Hematology

## 2021-09-20 ENCOUNTER — Encounter (HOSPITAL_COMMUNITY): Payer: Self-pay | Admitting: Hematology

## 2021-09-20 ENCOUNTER — Ambulatory Visit (HOSPITAL_COMMUNITY): Payer: Medicare Other | Admitting: Hematology

## 2021-09-20 DIAGNOSIS — R197 Diarrhea, unspecified: Secondary | ICD-10-CM | POA: Diagnosis not present

## 2021-09-20 DIAGNOSIS — D471 Chronic myeloproliferative disease: Secondary | ICD-10-CM | POA: Diagnosis not present

## 2021-09-20 DIAGNOSIS — E876 Hypokalemia: Secondary | ICD-10-CM | POA: Diagnosis not present

## 2021-09-20 DIAGNOSIS — R0602 Shortness of breath: Secondary | ICD-10-CM | POA: Diagnosis not present

## 2021-09-20 DIAGNOSIS — M2550 Pain in unspecified joint: Secondary | ICD-10-CM | POA: Diagnosis not present

## 2021-09-20 DIAGNOSIS — D72829 Elevated white blood cell count, unspecified: Secondary | ICD-10-CM

## 2021-09-20 DIAGNOSIS — I10 Essential (primary) hypertension: Secondary | ICD-10-CM | POA: Diagnosis not present

## 2021-09-20 LAB — SAMPLE TO BLOOD BANK

## 2021-09-20 LAB — CBC WITH DIFFERENTIAL/PLATELET
Abs Immature Granulocytes: 16.1 10*3/uL — ABNORMAL HIGH (ref 0.00–0.07)
Band Neutrophils: 11 %
Basophils Absolute: 0 10*3/uL (ref 0.0–0.1)
Basophils Relative: 0 %
Blasts: 4 %
Eosinophils Absolute: 0 10*3/uL (ref 0.0–0.5)
Eosinophils Relative: 0 %
HCT: 24.6 % — ABNORMAL LOW (ref 36.0–46.0)
Hemoglobin: 7.9 g/dL — ABNORMAL LOW (ref 12.0–15.0)
Lymphocytes Relative: 3 %
Lymphs Abs: 3.7 10*3/uL (ref 0.7–4.0)
MCH: 30.4 pg (ref 26.0–34.0)
MCHC: 32.1 g/dL (ref 30.0–36.0)
MCV: 94.6 fL (ref 80.0–100.0)
Metamyelocytes Relative: 7 %
Monocytes Absolute: 5 10*3/uL — ABNORMAL HIGH (ref 0.1–1.0)
Monocytes Relative: 4 %
Myelocytes: 4 %
Neutro Abs: 94.2 10*3/uL — ABNORMAL HIGH (ref 1.7–7.7)
Neutrophils Relative %: 65 %
Platelets: 154 10*3/uL (ref 150–400)
Promyelocytes Relative: 2 %
RBC: 2.6 MIL/uL — ABNORMAL LOW (ref 3.87–5.11)
RDW: 22.1 % — ABNORMAL HIGH (ref 11.5–15.5)
WBC: 124 10*3/uL (ref 4.0–10.5)
nRBC: 1.8 % — ABNORMAL HIGH (ref 0.0–0.2)
nRBC: 4 /100 WBC — ABNORMAL HIGH

## 2021-09-20 MED ORDER — SODIUM CHLORIDE 0.9% FLUSH
10.0000 mL | Freq: Once | INTRAVENOUS | Status: AC
  Administered 2021-09-20: 10 mL via INTRAVENOUS

## 2021-09-20 MED ORDER — HEPARIN SOD (PORK) LOCK FLUSH 100 UNIT/ML IV SOLN
500.0000 [IU] | Freq: Once | INTRAVENOUS | Status: AC
  Administered 2021-09-20: 500 [IU] via INTRAVENOUS

## 2021-09-20 NOTE — Patient Instructions (Signed)
[facility]  Discharge Instructions: Thank you for choosing [facility] to provide your oncology and hematology care.  If you have a lab appointment with the [facility], please come in thru the Main Entrance and check in at the main information desk.  Wear comfortable clothing and clothing appropriate for easy access to any Portacath or PICC line.   We strive to give you quality time with your provider. You may need to reschedule your appointment if you arrive late (15 or more minutes).  Arriving late affects you and other patients whose appointments are after yours.  Also, if you miss three or more appointments without notifying the office, you may be dismissed from the clinic at the provider's discretion.      For prescription refill requests, have your pharmacy contact our office and allow 72 hours for refills to be completed.    Today you received the following chemotherapy and/or immunotherapy agents [place] labs      To help prevent nausea and vomiting after your treatment, we encourage you to take your nausea medication as directed.  BELOW ARE SYMPTOMS THAT SHOULD BE REPORTED IMMEDIATELY: *FEVER GREATER THAN 100.4 F (38 C) OR HIGHER *CHILLS OR SWEATING *NAUSEA AND VOMITING THAT IS NOT CONTROLLED WITH YOUR NAUSEA MEDICATION *UNUSUAL SHORTNESS OF BREATH *UNUSUAL BRUISING OR BLEEDING *URINARY PROBLEMS (pain or burning when urinating, or frequent urination) *BOWEL PROBLEMS (unusual diarrhea, constipation, pain near the anus) TENDERNESS IN MOUTH AND THROAT WITH OR WITHOUT PRESENCE OF ULCERS (sore throat, sores in mouth, or a toothache) UNUSUAL RASH, SWELLING OR PAIN  UNUSUAL VAGINAL DISCHARGE OR ITCHING   Items with * indicate a potential emergency and should be followed up as soon as possible or go to the Emergency Department if any problems should occur.  Please show the CHEMOTHERAPY ALERT CARD or IMMUNOTHERAPY ALERT CARD at check-in to the  Emergency Department and triage nurse.  Should you have questions after your visit or need to cancel or reschedule your appointment, please contact [facility] [phone]  and follow the prompts.  Office hours are 8:00 a.m. to 4:30 p.m. Monday - Friday. Please note that voicemails left after 4:00 p.m. may not be returned until the following business day.  We are closed weekends and major holidays. You have access to a nurse at all times for urgent questions. Please call the main number to the clinic [phone] and follow the prompts.  For any non-urgent questions, you may also contact your provider using MyChart. We now offer e-Visits for anyone 33 and older to request care online for non-urgent symptoms. For details visit [email or web address].   Also download the MyChart app! Go to the app store, search "MyChart", open the app, select [facility], and log in with your MyChart username and password.  Masks are optional in the cancer centers. If you would like for your care team to wear a mask while they are taking care of you, please let them know. For doctor visits, patients may have with them one support person who is at least 76 years old. At this time, visitors are not allowed in the infusion area.

## 2021-09-20 NOTE — Telephone Encounter (Signed)
Patient called in regard to recent BP med   Patient states dosage was changed to 1 and 1/2   Patient wants a call back in regard to the 1/2.

## 2021-09-20 NOTE — Progress Notes (Signed)
CRITICAL VALUE ALERT Critical value received:  WBC 124,000 Date of notification:  09-20-21 Time of notification: 1457 Critical value read back:  Yes.   Nurse who received alert:  C. Ellene Bloodsaw RN MD notified time and response:  [phone], Dr. Raliegh Ip and Tarri Abernethy PA-C. Noted by providers.

## 2021-09-20 NOTE — Progress Notes (Signed)
Patients port flushed without difficulty.  Good blood return noted with no bruising or swelling noted at site.  Band aid applied.  VSS with discharge and left in satisfactory condition with no s/s of distress noted.   

## 2021-09-21 ENCOUNTER — Ambulatory Visit (HOSPITAL_COMMUNITY): Payer: Medicare Other | Admitting: Hematology

## 2021-09-21 ENCOUNTER — Other Ambulatory Visit: Payer: Self-pay | Admitting: *Deleted

## 2021-09-21 ENCOUNTER — Other Ambulatory Visit (HOSPITAL_COMMUNITY): Payer: Medicare Other

## 2021-09-21 ENCOUNTER — Ambulatory Visit (HOSPITAL_COMMUNITY)
Admission: RE | Admit: 2021-09-21 | Discharge: 2021-09-21 | Disposition: A | Discharge status: Home / Self Care | Payer: Medicare Other | Source: Ambulatory Visit | Attending: Hematology | Admitting: Hematology

## 2021-09-21 DIAGNOSIS — D471 Chronic myeloproliferative disease: Secondary | ICD-10-CM | POA: Diagnosis not present

## 2021-09-21 DIAGNOSIS — R161 Splenomegaly, not elsewhere classified: Secondary | ICD-10-CM | POA: Diagnosis not present

## 2021-09-21 NOTE — Patient Outreach (Signed)
  Care Coordination   09/21/2021  Name: Lakindra Wible MRN: [id number] DOB: Mar 16, 1945   Care Coordination Outreach Attempts:  A third unsuccessful outreach was attempted today to offer the patient with information about available care coordination services as a benefit of their health plan. HIPAA compliant messages left on voicemail, providing contact information for CSW, encouraging patient to return CSW's call at her earliest convenience.  Follow Up Plan:  No further outreach attempts will be made at this time. We have been unable to contact the patient to offer or enroll patient in care coordination services.  Encounter Outcome:  No Answer.   Care Coordination Interventions Activated:  No.    Care Coordination Interventions:  No, not indicated.    Nat Christen, BSW, MSW, LCSW  Licensed Education officer, environmental Health System  Mailing [place] [address], [place], [place] [address] Physical Address-[address], [place], [place] [address] Toll Free Main # [phone] Fax # [phone] Cell # [phone] [email or web address]

## 2021-09-22 ENCOUNTER — Inpatient Hospital Stay: Payer: Medicare Other

## 2021-09-22 NOTE — Telephone Encounter (Signed)
Patient aware requested to keep her appt on 10/08/21

## 2021-09-24 ENCOUNTER — Other Ambulatory Visit: Payer: Self-pay | Admitting: Family Medicine

## 2021-09-24 DIAGNOSIS — D471 Chronic myeloproliferative disease: Secondary | ICD-10-CM | POA: Diagnosis not present

## 2021-09-24 DIAGNOSIS — I129 Hypertensive chronic kidney disease with stage 1 through stage 4 chronic kidney disease, or unspecified chronic kidney disease: Secondary | ICD-10-CM | POA: Diagnosis not present

## 2021-09-24 DIAGNOSIS — F419 Anxiety disorder, unspecified: Secondary | ICD-10-CM | POA: Diagnosis not present

## 2021-09-24 DIAGNOSIS — I959 Hypotension, unspecified: Secondary | ICD-10-CM | POA: Diagnosis not present

## 2021-09-24 DIAGNOSIS — N183 Chronic kidney disease, stage 3 unspecified: Secondary | ICD-10-CM | POA: Diagnosis not present

## 2021-09-24 DIAGNOSIS — M329 Systemic lupus erythematosus, unspecified: Secondary | ICD-10-CM | POA: Diagnosis not present

## 2021-09-24 DIAGNOSIS — D509 Iron deficiency anemia, unspecified: Secondary | ICD-10-CM | POA: Diagnosis not present

## 2021-09-24 DIAGNOSIS — J9601 Acute respiratory failure with hypoxia: Secondary | ICD-10-CM | POA: Diagnosis not present

## 2021-09-24 DIAGNOSIS — F331 Major depressive disorder, recurrent, moderate: Secondary | ICD-10-CM | POA: Diagnosis not present

## 2021-09-24 DIAGNOSIS — M1990 Unspecified osteoarthritis, unspecified site: Secondary | ICD-10-CM | POA: Diagnosis not present

## 2021-09-24 DIAGNOSIS — N179 Acute kidney failure, unspecified: Secondary | ICD-10-CM | POA: Diagnosis not present

## 2021-09-24 DIAGNOSIS — F5105 Insomnia due to other mental disorder: Secondary | ICD-10-CM | POA: Diagnosis not present

## 2021-09-24 MED ORDER — TIZANIDINE HCL 4 MG PO TABS: 8.0000 mg | ORAL_TABLET | Freq: Three times a day (TID) | ORAL | 0 refills | Status: DC | PRN

## 2021-09-27 ENCOUNTER — Inpatient Hospital Stay (HOSPITAL_BASED_OUTPATIENT_CLINIC_OR_DEPARTMENT_OTHER): Payer: Medicare Other | Admitting: Hematology

## 2021-09-27 ENCOUNTER — Inpatient Hospital Stay: Payer: Medicare Other

## 2021-09-27 VITALS — BP 182/73 | HR 64 | Temp 97.8°F | Resp 18 | Ht 65.0 in | Wt 129.5 lb

## 2021-09-27 DIAGNOSIS — M2550 Pain in unspecified joint: Secondary | ICD-10-CM | POA: Diagnosis not present

## 2021-09-27 DIAGNOSIS — D471 Chronic myeloproliferative disease: Secondary | ICD-10-CM

## 2021-09-27 DIAGNOSIS — D72829 Elevated white blood cell count, unspecified: Secondary | ICD-10-CM

## 2021-09-27 DIAGNOSIS — R0602 Shortness of breath: Secondary | ICD-10-CM | POA: Diagnosis not present

## 2021-09-27 DIAGNOSIS — E876 Hypokalemia: Secondary | ICD-10-CM | POA: Diagnosis not present

## 2021-09-27 DIAGNOSIS — R197 Diarrhea, unspecified: Secondary | ICD-10-CM | POA: Diagnosis not present

## 2021-09-27 DIAGNOSIS — I10 Essential (primary) hypertension: Secondary | ICD-10-CM | POA: Diagnosis not present

## 2021-09-27 LAB — SAMPLE TO BLOOD BANK

## 2021-09-27 LAB — CBC WITH DIFFERENTIAL/PLATELET
Abs Immature Granulocytes: 11.2 10*3/uL — ABNORMAL HIGH (ref 0.00–0.07)
Band Neutrophils: 16 %
Basophils Absolute: 1.4 10*3/uL — ABNORMAL HIGH (ref 0.0–0.1)
Basophils Relative: 1 %
Blasts: 6 %
Eosinophils Absolute: 2.8 10*3/uL — ABNORMAL HIGH (ref 0.0–0.5)
Eosinophils Relative: 2 %
HCT: 24.1 % — ABNORMAL LOW (ref 36.0–46.0)
Hemoglobin: 8 g/dL — ABNORMAL LOW (ref 12.0–15.0)
Lymphocytes Relative: 15 %
Lymphs Abs: 21 10*3/uL — ABNORMAL HIGH (ref 0.7–4.0)
MCH: 31.5 pg (ref 26.0–34.0)
MCHC: 33.2 g/dL (ref 30.0–36.0)
MCV: 94.9 fL (ref 80.0–100.0)
Metamyelocytes Relative: 7 %
Monocytes Absolute: 21 10*3/uL — ABNORMAL HIGH (ref 0.1–1.0)
Monocytes Relative: 15 %
Myelocytes: 1 %
Neutro Abs: 74.1 10*3/uL — ABNORMAL HIGH (ref 1.7–7.7)
Neutrophils Relative %: 37 %
Platelets: 106 10*3/uL — ABNORMAL LOW (ref 150–400)
RBC: 2.54 MIL/uL — ABNORMAL LOW (ref 3.87–5.11)
RDW: 21.8 % — ABNORMAL HIGH (ref 11.5–15.5)
WBC Morphology: ABNORMAL
WBC: 139.8 10*3/uL (ref 4.0–10.5)
nRBC: 3.7 % — ABNORMAL HIGH (ref 0.0–0.2)

## 2021-09-27 LAB — COMPREHENSIVE METABOLIC PANEL
ALT: 23 U/L (ref 0–44)
AST: 49 U/L — ABNORMAL HIGH (ref 15–41)
Albumin: 3.7 g/dL (ref 3.5–5.0)
Alkaline Phosphatase: 163 U/L — ABNORMAL HIGH (ref 38–126)
Anion gap: 5 (ref 5–15)
BUN: 32 mg/dL — ABNORMAL HIGH (ref 8–23)
CO2: 24 mmol/L (ref 22–32)
Calcium: 8.2 mg/dL — ABNORMAL LOW (ref 8.9–10.3)
Chloride: 113 mmol/L — ABNORMAL HIGH (ref 98–111)
Creatinine, Ser: 1.37 mg/dL — ABNORMAL HIGH (ref 0.44–1.00)
GFR, Estimated: 40 mL/min — ABNORMAL LOW (ref >60)
Glucose, Bld: 102 mg/dL — ABNORMAL HIGH (ref 70–99)
Potassium: 4.2 mmol/L (ref 3.5–5.1)
Sodium: 142 mmol/L (ref 135–145)
Total Bilirubin: 0.6 mg/dL (ref 0.3–1.2)
Total Protein: 7.5 g/dL (ref 6.5–8.1)

## 2021-09-27 MED ORDER — SODIUM CHLORIDE 0.9% FLUSH
10.0000 mL | INTRAVENOUS | Status: DC | PRN
  Administered 2021-09-27: 10 mL via INTRAVENOUS

## 2021-09-27 MED ORDER — HEPARIN SOD (PORK) LOCK FLUSH 100 UNIT/ML IV SOLN
500.0000 [IU] | Freq: Once | INTRAVENOUS | Status: AC
  Administered 2021-09-27: 500 [IU] via INTRAVENOUS

## 2021-09-27 NOTE — Progress Notes (Signed)
CRITICAL VALUE ALERT Critical value received:  WBC 139.8.  Date of notification:  09-27-2021 Time of notification: 14:31pm Critical value read back:  Yes.   Nurse who received alert:  B. Elden Brucato RN  MD notified time and response:  [name] @ 14:48pm. No new orders received.

## 2021-09-27 NOTE — Progress Notes (Signed)
Patients port flushed without difficulty.  Good blood return noted with no bruising or swelling noted at site.  Band aid applied.  VSS with discharge and left in satisfactory condition with no s/s of distress noted.   

## 2021-09-27 NOTE — Patient Instructions (Addendum)
[facility] at [facility] Discharge Instructions  You were seen and examined today by Dr. Katragadda.  Dr. Katragadda discussed your most recent lab work and ultrasound which revealed that your spleen has reduced slightly in size. Your labs look okay other than your kidney function is elevated but stable. Bone Marrow biopsy did not show any evidence of leukemia.  Continue taking Jakafi twice daily as prescribed.  Follow-up as scheduled in 6 weeks.    Thank you for choosing [facility] at [facility] to provide your oncology and hematology care.  To afford each patient quality time with our provider, please arrive at least 15 minutes before your scheduled appointment time.   If you have a lab appointment with the Cancer Center please come in thru the Main Entrance and check in at the main information desk.  You need to re-schedule your appointment should you arrive 10 or more minutes late.  We strive to give you quality time with our providers, and arriving late affects you and other patients whose appointments are after yours.  Also, if you no show three or more times for appointments you may be dismissed from the clinic at the providers discretion.     Again, thank you for choosing [facility].  Our hope is that these requests will decrease the amount of time that you wait before being seen by our physicians.       _____________________________________________________________  Should you have questions after your visit to [facility], please contact our office at [phone] and follow the prompts.  Our office hours are 8:00 a.m. and 4:30 p.m. Monday - Friday.  Please note that voicemails left after 4:00 p.m. may not be returned until the following business day.  We are closed weekends and major holidays.  You do have access to a nurse 24-7, just call the main number to the clinic [phone] and do not press any  options, hold on the line and a nurse will answer the phone.    For prescription refill requests, have your pharmacy contact our office and allow 72 hours.        

## 2021-09-27 NOTE — Progress Notes (Signed)
[place], [place] [address]   CLINIC:  Medical Oncology/Hematology  PCP:  [name], MD [address], [place] / [place] [address] [phone]   REASON FOR VISIT:  Follow-up for MPL positive myeloproliferative neoplasm  PRIOR THERAPY: none  NGS Results: not done  CURRENT THERAPY: Jakafi 10 mg twice daily  INTERVAL HISTORY:  [name], a 76 y.o. female, seen for follow-up of MPL positive myeloproliferative neoplasm.  She reports energy levels of 80%.  Chronic pain, leg and hip pains have been stable.  Reports occasional shortness of breath on exertion.  Reports that diarrhea has completely normalized.  She was started on blood pressure medication metoprolol on 09/09/2021 and dose has increased to 1 and half tablet twice daily around 09/20/2021.  She has reported itching all over from the inside out since the dose was increased.  She also increased ruxolitinib dose to twice daily at that time.  She has previously taken ruxolitinib twice daily without any major problems.  She is taking Zyrtec which is helping.  REVIEW OF SYSTEMS:  Review of Systems  Constitutional:  Negative for appetite change, fatigue (improved) and fever.  Respiratory:  Positive for shortness of breath. Negative for cough.   Cardiovascular:  Negative for leg swelling.  Gastrointestinal:  Negative for diarrhea.  Musculoskeletal:  Positive for arthralgias (Hip, back and legs stable).  All other systems reviewed and are negative.   PAST MEDICAL/SURGICAL HISTORY:  Past Medical History:  Diagnosis Date   Acute cholangitis    Allergy    Anemia    Anxiety    Arthritis    Phreesia 03/18/2020   Barrett's esophagus    Cataract    Chronic back pain    Chronic neck pain    CKD (chronic kidney disease) stage 3, GFR 30-59 ml/min ([place]) 10/29/2020   Depression    Genital herpes    GERD (gastroesophageal reflux disease)    H/O degenerative disc disease     History of blood transfusion    Hypertension    Insomnia    Lupus (systemic lupus erythematosus) ([place])    Neuromuscular disorder ([place])    Osteoarthritis    S/P colonoscopy June 2005   normal, no polyps   S/P endoscopy June 2005, Oct 2009   2005: short-segment Barrett's, 2009: short-segment Barrett's   Upper respiratory tract infection 07/09/2020   UTI (lower urinary tract infection) 11/2012   Past Surgical History:  Procedure Laterality Date   ABDOMINAL HYSTERECTOMY     BACK SURGERY     BIOPSY N/A 03/20/2014   Procedure: BIOPSY;  Surgeon: Daneil Dolin, MD;  Location: [facility];  Service: Endoscopy;  Laterality: N/A;   BIOPSY  09/14/2015   Procedure: BIOPSY;  Surgeon: Daneil Dolin, MD;  Location: [facility];  Service: Endoscopy;;  esophageal and gastric   BIOPSY  07/23/2019   Procedure: BIOPSY;  Surgeon: Daneil Dolin, MD;  Location: [facility];  Service: Endoscopy;;  esophageal    CARPAL TUNNEL RELEASE Left 2013   cervical disectomy  2002   CESAREAN SECTION N/A    Phreesia 03/18/2020   CHOLECYSTECTOMY     with lysis of adhesions for sbo; "ruptured gallbladder".   COLONOSCOPY  11/09/2011   RMR: Melanosis coli   COLONOSCOPY WITH PROPOFOL N/A 09/14/2015   Dr. Gala Romney: diverticulosis, 42m TA removed. next TCS 09/2020.    COLONOSCOPY WITH PROPOFOL N/A 04/30/2020   Procedure: COLONOSCOPY WITH PROPOFOL;  Surgeon: Daneil Dolin, MD;  [facility];  Service: Endoscopy;  Laterality: N/A;  PM (ASA 3)   DENTAL SURGERY  11/2015   multiple tooth extraction   ESOPHAGOGASTRODUODENOSCOPY  11/29/2007   salmon-colored  tongue   longest stable at  3 cm, distal esophagus as described previously status post biopsy/ Hiatal hernia, otherwise normal stomach D1 and D2   ESOPHAGOGASTRODUODENOSCOPY  01/06/11   short segment Barrett's esophagus s/p bx/Hiatal hernia   ESOPHAGOGASTRODUODENOSCOPY (EGD) WITH PROPOFOL N/A 03/20/2014   MEB:RAXENMMH distal esophagus short segment barrett's, bx with  no dysplasia. next egd in 03/2017   ESOPHAGOGASTRODUODENOSCOPY (EGD) WITH PROPOFOL N/A 09/14/2015   Dr. Gala Romney: Barrett's without dysplasia, gastritis benign bx, hiatal hernia. next EGD 09/2018.   ESOPHAGOGASTRODUODENOSCOPY (EGD) WITH PROPOFOL N/A 07/23/2019   Procedure: ESOPHAGOGASTRODUODENOSCOPY (EGD) WITH PROPOFOL;  Surgeon: Daneil Dolin, MD;  [facility];  Service: Endoscopy;  Laterality: N/A;  3:00pm   EYE SURGERY N/A    Phreesia 03/18/2020   HERNIA REPAIR Right 07/2010   Dr. Zada Girt   IR IMAGING GUIDED PORT INSERTION  02/09/2021   JOINT REPLACEMENT     LAPAROSCOPIC CHOLECYSTECTOMY  2017   at [facility]   POLYPECTOMY  09/14/2015   Procedure: POLYPECTOMY;  Surgeon: Daneil Dolin, MD;  Location: [facility];  Service: Endoscopy;;  ascending colon   right hip replacement  07/2010   went back in sept 2012 to fix   SHOULDER ARTHROSCOPY  2008   left   SPINE SURGERY N/A    Phreesia 03/18/2020   TOTAL HIP REVISION Right 12/17/2012   Procedure: RIGHT TOTAL HIP REVISION;  Surgeon: Mauri Pole, MD;  Location: [facility];  Service: Orthopedics;  Laterality: Right;   WRIST SURGERY Right 2011   open reduction right wrist.    SOCIAL HISTORY:  Social History   Socioeconomic History   Marital status: Married    Spouse name: Renee Waters   Number of children: 4   Years of education: 12+   Highest education level: Some college, no degree  Occupational History   Occupation: Press photographer  - retired   Occupation: non profit  Tobacco Use   Smoking status: Former    Packs/day: 0.25    Years: 25.00    Total pack years: 6.25    Types: Cigarettes    Quit date: 02/07/2003    Years since quitting: 18.6   Smokeless tobacco: Never   Tobacco comments:    quit in 2004  Vaping Use   Vaping Use: Never used  Substance and Sexual Activity   Alcohol use: No   Drug use: No   Sexual activity: Not Currently    Birth control/protection: Surgical  Other Topics Concern   Not on file  Social History  Narrative   Not on file   Social Determinants of Health   Financial Resource Strain: Low Risk  (12/28/2020)   Overall Financial Resource Strain (CARDIA)    Difficulty of Paying Living Expenses: Not hard at all  Food Insecurity: No Food Insecurity (12/28/2020)   Hunger Vital Sign    Worried About Running Out of Food in the Last Year: Never true    [place] in the Last Year: Never true  Transportation Needs: No Transportation Needs (12/28/2020)   PRAPARE - Hydrologist (Medical): No    Lack of Transportation (Non-Medical): No  Recent Concern: Transportation Needs - Unmet Transportation Needs (10/13/2020)   PRAPARE - Transportation  Lack of Transportation (Medical): Yes    Lack of Transportation (Non-Medical): Yes  Physical Activity: Inactive (04/23/2021)   Exercise Vital Sign    Days of Exercise per Week: 0 days    Minutes of Exercise per Session: 0 min  Stress: Stress Concern Present (04/23/2021)   [place]    Feeling of Stress : Rather much  Social Connections: Moderately Integrated (12/28/2020)   Social Connection and Isolation Panel [NHANES]    Frequency of Communication with Friends and Family: More than three times a week    Frequency of Social Gatherings with Friends and Family: Once a week    Attends Religious Services: More than 4 times per year    Active Member of [employer] or Organizations: No    Attends Archivist Meetings: Never    Marital Status: Married  Human resources officer Violence: Not At Risk (12/28/2020)   Humiliation, Afraid, Rape, and Kick questionnaire    Fear of Current or Ex-Partner: No    Emotionally Abused: No    Physically Abused: No    Sexually Abused: No    FAMILY HISTORY:  Family History  Problem Relation Age of Onset   Hypertension Mother    Stroke Mother    Colon cancer Neg Hx    Anesthesia problems Neg Hx    Hypotension Neg Hx     Malignant hyperthermia Neg Hx    Pseudochol deficiency Neg Hx    Gastric cancer Neg Hx    Esophageal cancer Neg Hx     CURRENT MEDICATIONS:  Current Outpatient Medications  Medication Sig Dispense Refill   acyclovir (ZOVIRAX) 800 MG tablet Take 1 tablet (800 mg total) by mouth 5 (five) times daily. 50 tablet 1   acyclovir ointment (ZOVIRAX) 5 % Apply 1 application. topically every 3 (three) hours. 15 g 1   allopurinol (ZYLOPRIM) 300 MG tablet Take 1 tablet by mouth once daily 30 tablet 0   citalopram (CELEXA) 20 MG tablet Take 1 tablet by mouth once daily 30 tablet 3   EPINEPHRINE 0.3 mg/0.3 mL IJ SOAJ injection INJECT 0.3 MLS INTO MUSCLE ONCE AS NEEDED (Patient taking differently: Inject 0.3 mg into the muscle as needed for anaphylaxis.) 1 Device 2   furosemide (LASIX) 20 MG tablet Take 1 tablet (20 mg total) by mouth daily as needed. 30 tablet 2   gabapentin (NEURONTIN) 300 MG capsule Take one capsule by mouth once daily, as needed, for pain 90 capsule 1   JAKAFI 15 MG tablet TAKE 1 TABLET (15MG) BY MOUTH TWICE A DAY 60 tablet 3   lidocaine-prilocaine (EMLA) cream Apply 1 application. topically as needed. 30 g 3   losartan (COZAAR) 25 MG tablet Take 1 tablet by mouth once daily for blood pressure 30 tablet 5   meloxicam (MOBIC) 7.5 MG tablet Take 1 tablet (7.5 mg total) by mouth daily as needed for pain (knee pain). 30 tablet 5   metoprolol tartrate (LOPRESSOR) 25 MG tablet Take one and half pill making  37.5 mg twice daily 90 tablet 1   mirtazapine (REMERON) 15 MG tablet Take 1 tablet (15 mg total) by mouth at bedtime. 30 tablet 5   oxyCODONE (OXY IR/ROXICODONE) 5 MG immediate release tablet Take 5 mg by mouth every 4 (four) hours as needed.     pantoprazole (PROTONIX) 40 MG tablet TAKE 1 TABLET BY MOUTH ONCE DAILY BEFORE BREAKFAST 90 tablet 0   potassium chloride (KLOR-CON) 10 MEQ tablet Take 1  tablet by mouth once daily 30 tablet 2   temazepam (RESTORIL) 30 MG capsule Take 1 capsule (30  mg total) by mouth at bedtime as needed for sleep. 30 capsule 5   temazepam (RESTORIL) 30 MG capsule Take 1 capsule (30 mg total) by mouth at bedtime. 30 capsule 0   tiZANidine (ZANAFLEX) 4 MG tablet Take 2 tablets (8 mg total) by mouth 3 (three) times daily as needed (spasms). 180 tablet 0   No current facility-administered medications for this visit.   Facility-Administered Medications Ordered in Other Visits  Medication Dose Route Frequency Provider Last Rate Last Admin   lanreotide acetate (SOMATULINE DEPOT) 120 MG/0.5ML injection            octreotide (SANDOSTATIN LAR) 30 MG IM injection             ALLERGIES:  Allergies  Allergen Reactions   Bee Venom Swelling and Hives   Acetaminophen     itches   Amlodipine     Hair loss   Tyloxapol    Other Itching, Rash and Swelling   Oxycodone-Acetaminophen Rash    Pt states, "this gives her a rash, but at home she takes oxycodone for pain relief" Pt states, "this gives her a rash, but at home she takes oxycodone for pain relief"    PHYSICAL EXAM:  Performance status (ECOG): 1 - Symptomatic but completely ambulatory  Vitals:   09/27/21 1413  BP: (!) 182/73  Pulse: 64  Resp: 18  Temp: 97.8 F (36.6 C)  SpO2: 95%   Wt Readings from Last 3 Encounters:  09/27/21 129 lb 8 oz (58.7 kg)  09/10/21 131 lb (59.4 kg)  09/09/21 130 lb 1.1 oz (59 kg)   Physical Exam Vitals reviewed.  Constitutional:      Appearance: Normal appearance.  Cardiovascular:     Rate and Rhythm: Normal rate and regular rhythm.     Pulses: Normal pulses.     Heart sounds: Normal heart sounds.  Pulmonary:     Effort: Pulmonary effort is normal.     Breath sounds: Normal breath sounds.  Abdominal:     Palpations: Abdomen is soft. There is hepatomegaly (palpable 3 fingerbreadths below costal margin) and splenomegaly (2 fingerbreadths below costal margin'). There is no mass.     Tenderness: There is no abdominal tenderness.  Musculoskeletal:     Right  lower leg: 1+ Edema present.     Left lower leg: 1+ Edema present.  Neurological:     General: No focal deficit present.     Mental Status: She is alert and oriented to person, place, and time.  Psychiatric:        Mood and Affect: Mood normal.        Behavior: Behavior normal.      LABORATORY DATA:  I have reviewed the labs as listed.     Latest Ref Rng & Units 09/27/2021   12:49 PM 09/20/2021    1:12 PM 09/09/2021    8:09 AM  CBC  WBC 4.0 - 10.5 K/uL 139.8  124.0  125.4   Hemoglobin 12.0 - 15.0 g/dL 8.0  7.9  9.1   Hematocrit 36.0 - 46.0 % 24.1  24.6  27.6   Platelets 150 - 400 K/uL 106  154  214       Latest Ref Rng & Units 09/27/2021   12:49 PM 09/06/2021   12:40 PM 09/03/2021    5:38 AM  CMP  Glucose 70 - 99 mg/dL 102  83  95   BUN 8 - 23 mg/dL 32  24  23   Creatinine 0.44 - 1.00 mg/dL 1.37  1.19  1.14   Sodium 135 - 145 mmol/L 142  141  141   Potassium 3.5 - 5.1 mmol/L 4.2  4.3  3.6   Chloride 98 - 111 mmol/L 113  113  110   CO2 22 - 32 mmol/L 24  23  26    Calcium 8.9 - 10.3 mg/dL 8.2  8.1  8.4   Total Protein 6.5 - 8.1 g/dL 7.5  7.5    Total Bilirubin 0.3 - 1.2 mg/dL 0.6  0.8    Alkaline Phos 38 - 126 U/L 163  187    AST 15 - 41 U/L 49  47    ALT 0 - 44 U/L 23  26      DIAGNOSTIC IMAGING:  I have independently reviewed the scans and discussed with the patient. US SPLEEN (ABDOMEN LIMITED)  Result Date: 09/21/2021 CLINICAL DATA:  Evaluate for splenomegaly. EXAM: ULTRASOUND ABDOMEN LIMITED COMPARISON:  CT abdomen pelvis 08/25/2021 FINDINGS: Targeted ultrasound of the spleen is performed. The spleen is again mildly enlarged measuring 13.6 x 6.6 x 11.3 cm. Splenic volume measures 532 mL. IMPRESSION: Mild splenomegaly. Electronically Signed   By: Audie Pinto M.D.   On: 09/21/2021 15:55   CT BONE MARROW BIOPSY & ASPIRATION  Result Date: 09/09/2021 INDICATION: Myeloproliferative neoplasm EXAM: CT GUIDED RIGHT ILIAC BONE MARROW ASPIRATION AND CORE BIOPSY Date:   09/09/2021 09/09/2021 10:03 am Radiologist:  M. Daryll Brod, MD Guidance:  CT FLUOROSCOPY: Fluoroscopy Time: NONE. MEDICATIONS: 1% lidocaine local ANESTHESIA/SEDATION: 2.0 mg IV Versed; 100 mcg IV Fentanyl Moderate Sedation Time:  10 minute The patient was continuously monitored during the procedure by the interventional radiology nurse under my direct supervision. CONTRAST:  None. COMPLICATIONS: None PROCEDURE: Informed consent was obtained from the patient following explanation of the procedure, risks, benefits and alternatives. The patient understands, agrees and consents for the procedure. All questions were addressed. A time out was performed. The patient was positioned prone and non-contrast localization CT was performed of the pelvis to demonstrate the iliac marrow spaces. Maximal barrier sterile technique utilized including caps, mask, sterile gowns, sterile gloves, large sterile drape, hand hygiene, and Betadine prep. Under sterile conditions and local anesthesia, an 11 gauge coaxial bone biopsy needle was advanced into the right iliac marrow space. Needle position was confirmed with CT imaging. Initially, bone marrow aspiration was performed. Next, the 11 gauge outer cannula was utilized to obtain a right iliac bone marrow core biopsy. Needle was removed. Hemostasis was obtained with compression. The patient tolerated the procedure well. Samples were prepared with the cytotechnologist. No immediate complications. IMPRESSION: CT guided right iliac bone marrow aspiration and core biopsy. Electronically Signed   By: Jerilynn Mages.  Shick M.D.   On: 09/09/2021 10:18   DG CHEST PORT 1 VIEW  Result Date: 09/02/2021 CLINICAL DATA:  Hypotension EXAM: PORTABLE CHEST 1 VIEW COMPARISON:  Previous studies including the examination of 09/01/2021 FINDINGS: Transverse diameter heart is increased. Thoracic aorta is tortuous and ectatic. Central pulmonary vessels are slightly less prominent. There are no signs of alveolar pulmonary  edema. Small patchy infiltrate is seen in the lower lung fields. Costophrenic angles are clear. There is faint air soft tissue interface in the lateral aspect of right mid lung field. This may be an artifact due to skin fold. Possibility of small pneumothorax is not excluded. Tip of right IJ chest port  is seen in superior vena cava. IMPRESSION: There is faint area soft tissue interface in the lateral aspect of right mid lung field, possibly an artifact due to skin fold. Less likely possibility would be small right pneumothorax. Repeat chest radiographs should be considered. Cardiomegaly. Central pulmonary vessels are slightly less prominent. There are no signs of alveolar pulmonary edema or focal pulmonary consolidation. There are small linear densities in both lower lung fields suggesting subsegmental atelectasis. Electronically Signed   By: Elmer Picker M.D.   On: 09/02/2021 16:08   CT CHEST WO CONTRAST  Result Date: 09/01/2021 CLINICAL DATA:  Hematologic malignancy, monitor To evaluate for atypical infections in immunocompromised patient with cough EXAM: CT CHEST WITHOUT CONTRAST TECHNIQUE: Multidetector CT imaging of the chest was performed following the standard protocol without IV contrast. RADIATION DOSE REDUCTION: This exam was performed according to the departmental dose-optimization program which includes automated exposure control, adjustment of the mA and/or kV according to patient size and/or use of iterative reconstruction technique. COMPARISON:  Chest x-ray 08/25/2021, chest x-ray 09/01/2021 FINDINGS: Cardiovascular: Right chest wall port catheter tip terminating just distal to the superior cavoatrial junction. Normal heart size. No significant pericardial effusion. The thoracic aorta is normal in caliber. Mild atherosclerotic plaque of the thoracic aorta. No coronary artery calcifications. Mediastinum/Nodes: No gross hilar adenopathy, noting limited sensitivity for the detection of hilar  adenopathy on this noncontrast study. No enlarged mediastinal or axillary lymph nodes. Thyroid gland, trachea, and esophagus demonstrate no significant findings. Lungs/Pleura: Mild to moderate paraseptal and centrilobular emphysematous changes. No focal consolidation. No pulmonary nodule. No pulmonary mass. Bilateral trace pleural effusions. No pneumothorax. Upper Abdomen: No acute abnormality. Musculoskeletal: No chest wall abnormality. No suspicious lytic or blastic osseous lesions. No acute displaced fracture. Multilevel degenerative changes of the spine. IMPRESSION: 1. Bilateral trace pleural effusions. 2. Aortic Atherosclerosis (ICD10-I70.0) and Emphysema (ICD10-J43.9). Electronically Signed   By: Iven Finn M.D.   On: 09/01/2021 22:52   DG CHEST PORT 1 VIEW  Result Date: 09/01/2021 CLINICAL DATA:  Acute kidney injury and hypotension.  SIRS. EXAM: PORTABLE CHEST 1 VIEW COMPARISON:  08/31/2021 FINDINGS: Power injectable right Port-A-Cath tip: Lower SVC. Atherosclerotic calcification of the aortic arch. Upper normal heart size. Cephalization of blood flow on this mostly upright image, raising the possibility of pulmonary venous hypertension. Linear subsegmental atelectasis or scarring along the left hemidiaphragm. IMPRESSION: 1. Cephalization of blood flow suggesting pulmonary venous hypertension. Upper normal heart size. 2. Subsegmental atelectasis along the left hemidiaphragm. 3.  Aortic Atherosclerosis (ICD10-I70.0). Electronically Signed   By: Van Clines M.D.   On: 09/01/2021 08:17   DG HIP UNILAT WITH PELVIS 2-3 VIEWS RIGHT  Result Date: 09/01/2021 CLINICAL DATA:  [place] 4 days ago with continued right hip pain. Right hip replacement 2012, revision 2014. EXAM: DG HIP (WITH OR WITHOUT PELVIS) 2-3V RIGHT COMPARISON:  Recent CT of abdomen and pelvis and reconstructions of 08/25/2021. FINDINGS: There is no evidence of hip fracture or dislocation. Right hip total joint arthroplasty appears well  seated. Bone mineralization is osteopenic. There is mild arthrosis of the left hip joint. Both SI joints and pubic symphysis are unremarkable, as visualized. There are patchy calcifications in the right femoral artery. Artifact from overlying clothing. Soft tissues are otherwise unremarkable. IMPRESSION: Right hip arthroplasty.  Osteopenia without evidence of fractures. Electronically Signed   By: Telford Nab M.D.   On: 09/01/2021 05:33   CT HEAD WO CONTRAST (5MM)  Result Date: 09/01/2021 CLINICAL DATA:  Initial evaluation for  head trauma. EXAM: CT HEAD WITHOUT CONTRAST TECHNIQUE: Contiguous axial images were obtained from the base of the skull through the vertex without intravenous contrast. RADIATION DOSE REDUCTION: This exam was performed according to the departmental dose-optimization program which includes automated exposure control, adjustment of the mA and/or kV according to patient size and/or use of iterative reconstruction technique. COMPARISON:  Prior CT from 01/22/2015. FINDINGS: Brain: Mild age-related cerebral atrophy. No acute intracranial hemorrhage. No acute large vessel territory infarct. No mass lesion, mass effect, or midline shift. No hydrocephalus or extra-axial fluid collection. Vascular: No hyperdense vessel. Skull: Scalp soft tissues demonstrate no acute finding. Calvarium intact. Osteopenia noted. Sinuses/Orbits: Globes and orbital soft tissues demonstrate no acute finding. Paranasal sinuses are largely clear. No mastoid effusion. Other: None. IMPRESSION: 1. No acute intracranial abnormality. 2. Mild age-related cerebral atrophy. Electronically Signed   By: Jeannine Boga M.D.   On: 09/01/2021 03:13   DG Chest Port 1 View  Result Date: 08/31/2021 CLINICAL DATA:  Hypotension, possible sepsis EXAM: PORTABLE CHEST 1 VIEW COMPARISON:  Portable exam 1559 hours compared to 08/25/2021 FINDINGS: RIGHT jugular Port-A-Cath with tip projecting over SVC. Upper normal heart size.  Mediastinal contours and pulmonary vascularity normal. Lungs clear. No pulmonary infiltrate, pleural effusion, or pneumothorax. Bones demineralized. IMPRESSION: No acute abnormalities. Electronically Signed   By: Lavonia Dana M.D.   On: 08/31/2021 16:08     ASSESSMENT:  Leukocytosis with left shift: - CBC on 11/06/2020 with white count 75.6, differential 59% neutrophils, 12% monocytes, 4% lymphocytes, 1 percentage of eosinophils and basophils, 6% band neutrophils, 12% myelocytes, 1% promyelocytes, 29% blasts. - Pathologist review of blood smear reported as leukoerythroblastic reaction. - 25 pound weight loss in the last 6 months, unintentional.  Decreased appetite.  Reports fatigue for the last few months.  Reports night sweats x3 in the last 1 month. - Bone marrow biopsy on 11/18/2020 consistent with granulocytic proliferation with differential including CMML versus myeloproliferative disorder. - BCR/ABL was negative. 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 - NGS myeloid panel shows mutations in ASXL1, MPL, TET2, EZH2 mutations. - Spleen ultrasound on 12/16/2020 shows mildly enlarged measuring 12.4 x 9.6 x 7 cm with volume of 435 cc. - PDGFR alpha, beta and FGFR 1 was negative. - She was evaluated by Dr. Florene Glen at [facility].  Slides were reviewed at [facility] hematopathology.  They thought it was less likely CMML and more likely MPL positive myeloproliferative neoplasm.  Dr. Florene Glen has recommended initiate Jakafi. - Ruxolitinib 10 mg twice daily started on 02/16/2021.  Dose increased to 15 mg twice daily on 05/18/2021. - CTAP on 04/07/2021: Hepatomegaly and splenomegaly measuring 14.3 cm.  No other pathology.  No spleen infarcts.  2.  Social/family history: - She lives at home and is able to do all her ADLs and IADLs although she is getting tired lately.  She reports quitting smoking 6 months ago and smoked 1 pack/week for 43 years. - She believes that her mother  had some kind of leukemia.  No other malignancies.   PLAN:  MPL positive myeloproliferative neoplasm: -Ruxolitinib dose increased to 15 mg twice daily on 09/20/2021. - Ultrasound spleen (09/21/2021): 13.6 cm with volume 532 mL.  This is slightly improved from previous volume.      - Bone marrow biopsy (09/09/2021): Material is limited but overall features consistent with myeloid neoplasm.  No increase in blasts.  Erythroid precursors appear decreased but megakaryocytes are variably evident with clusters and small  forms.  Reticulin's shows variable increase in reticulin fibers including areas with prominent increase.  Chromosome analysis was normal.  No significant changes at this time.  I have recommended continuing Jakafi 15 mg twice daily.  She reports that she is seeking a second opinion at [facility].  I plan to repeat her CBC in 3 weeks to see if she needs any blood transfusion.  RTC 6 weeks for follow-up with repeat labs.  2.  Decreased appetite: - Her weight has been stable since the discharge.  3.  CKD: - Baseline creatinine between 1.3-1.5.  Today it is 1.37.  4.  Hypokalemia: - Continue potassium supplements.  Potassium today is normal.  5.  Generalized itching: - She reported itching all over from the inside out since last 1 week.  1 week ago, metoprolol dose was increased to 1 and half tablets twice daily.  Ruxolitinib was also increased to 15 mg twice daily.  However she has previously taken ruxolitinib at the same dose and did not have any itching.  No other vasomotor symptoms. - Her blood pressure is also high at 182/73.  She is going to see Dr. Moshe Cipro next week for titration of blood pressure medication.    Orders placed this encounter:  Orders Placed This Encounter  Procedures   CBC with Differential/Platelet   Comprehensive metabolic panel   Magnesium   Lactate dehydrogenase      Derek Jack, MD [place] [phone]

## 2021-09-28 ENCOUNTER — Other Ambulatory Visit (HOSPITAL_COMMUNITY): Payer: Self-pay | Admitting: Hematology

## 2021-09-28 DIAGNOSIS — J9601 Acute respiratory failure with hypoxia: Secondary | ICD-10-CM | POA: Diagnosis not present

## 2021-09-28 DIAGNOSIS — I129 Hypertensive chronic kidney disease with stage 1 through stage 4 chronic kidney disease, or unspecified chronic kidney disease: Secondary | ICD-10-CM | POA: Diagnosis not present

## 2021-09-28 DIAGNOSIS — N183 Chronic kidney disease, stage 3 unspecified: Secondary | ICD-10-CM | POA: Diagnosis not present

## 2021-09-28 DIAGNOSIS — D509 Iron deficiency anemia, unspecified: Secondary | ICD-10-CM | POA: Diagnosis not present

## 2021-09-28 DIAGNOSIS — F5105 Insomnia due to other mental disorder: Secondary | ICD-10-CM | POA: Diagnosis not present

## 2021-09-28 DIAGNOSIS — F419 Anxiety disorder, unspecified: Secondary | ICD-10-CM | POA: Diagnosis not present

## 2021-09-28 DIAGNOSIS — D471 Chronic myeloproliferative disease: Secondary | ICD-10-CM | POA: Diagnosis not present

## 2021-09-28 DIAGNOSIS — F331 Major depressive disorder, recurrent, moderate: Secondary | ICD-10-CM | POA: Diagnosis not present

## 2021-09-28 DIAGNOSIS — N179 Acute kidney failure, unspecified: Secondary | ICD-10-CM | POA: Diagnosis not present

## 2021-09-28 DIAGNOSIS — M329 Systemic lupus erythematosus, unspecified: Secondary | ICD-10-CM | POA: Diagnosis not present

## 2021-09-28 DIAGNOSIS — I959 Hypotension, unspecified: Secondary | ICD-10-CM | POA: Diagnosis not present

## 2021-09-28 DIAGNOSIS — M1990 Unspecified osteoarthritis, unspecified site: Secondary | ICD-10-CM | POA: Diagnosis not present

## 2021-09-29 ENCOUNTER — Inpatient Hospital Stay: Payer: Medicare Other

## 2021-10-01 DIAGNOSIS — D509 Iron deficiency anemia, unspecified: Secondary | ICD-10-CM | POA: Diagnosis not present

## 2021-10-01 DIAGNOSIS — I129 Hypertensive chronic kidney disease with stage 1 through stage 4 chronic kidney disease, or unspecified chronic kidney disease: Secondary | ICD-10-CM | POA: Diagnosis not present

## 2021-10-01 DIAGNOSIS — J9601 Acute respiratory failure with hypoxia: Secondary | ICD-10-CM | POA: Diagnosis not present

## 2021-10-01 DIAGNOSIS — I959 Hypotension, unspecified: Secondary | ICD-10-CM | POA: Diagnosis not present

## 2021-10-01 DIAGNOSIS — N183 Chronic kidney disease, stage 3 unspecified: Secondary | ICD-10-CM | POA: Diagnosis not present

## 2021-10-01 DIAGNOSIS — N179 Acute kidney failure, unspecified: Secondary | ICD-10-CM | POA: Diagnosis not present

## 2021-10-04 ENCOUNTER — Other Ambulatory Visit: Payer: Self-pay

## 2021-10-04 DIAGNOSIS — G8929 Other chronic pain: Secondary | ICD-10-CM | POA: Diagnosis not present

## 2021-10-04 DIAGNOSIS — K2270 Barrett's esophagus without dysplasia: Secondary | ICD-10-CM | POA: Diagnosis not present

## 2021-10-04 DIAGNOSIS — K219 Gastro-esophageal reflux disease without esophagitis: Secondary | ICD-10-CM | POA: Diagnosis not present

## 2021-10-04 DIAGNOSIS — F5105 Insomnia due to other mental disorder: Secondary | ICD-10-CM | POA: Diagnosis not present

## 2021-10-04 DIAGNOSIS — J9601 Acute respiratory failure with hypoxia: Secondary | ICD-10-CM | POA: Diagnosis not present

## 2021-10-04 DIAGNOSIS — E559 Vitamin D deficiency, unspecified: Secondary | ICD-10-CM | POA: Diagnosis not present

## 2021-10-04 DIAGNOSIS — N183 Chronic kidney disease, stage 3 unspecified: Secondary | ICD-10-CM | POA: Diagnosis not present

## 2021-10-04 DIAGNOSIS — R32 Unspecified urinary incontinence: Secondary | ICD-10-CM | POA: Diagnosis not present

## 2021-10-04 DIAGNOSIS — M519 Unspecified thoracic, thoracolumbar and lumbosacral intervertebral disc disorder: Secondary | ICD-10-CM | POA: Diagnosis not present

## 2021-10-04 DIAGNOSIS — M329 Systemic lupus erythematosus, unspecified: Secondary | ICD-10-CM | POA: Diagnosis not present

## 2021-10-04 DIAGNOSIS — F331 Major depressive disorder, recurrent, moderate: Secondary | ICD-10-CM | POA: Diagnosis not present

## 2021-10-04 DIAGNOSIS — N179 Acute kidney failure, unspecified: Secondary | ICD-10-CM | POA: Diagnosis not present

## 2021-10-04 DIAGNOSIS — F419 Anxiety disorder, unspecified: Secondary | ICD-10-CM | POA: Diagnosis not present

## 2021-10-04 DIAGNOSIS — Z9181 History of falling: Secondary | ICD-10-CM | POA: Diagnosis not present

## 2021-10-04 DIAGNOSIS — I129 Hypertensive chronic kidney disease with stage 1 through stage 4 chronic kidney disease, or unspecified chronic kidney disease: Secondary | ICD-10-CM | POA: Diagnosis not present

## 2021-10-04 DIAGNOSIS — K5900 Constipation, unspecified: Secondary | ICD-10-CM | POA: Diagnosis not present

## 2021-10-04 DIAGNOSIS — Z87440 Personal history of urinary (tract) infections: Secondary | ICD-10-CM | POA: Diagnosis not present

## 2021-10-04 DIAGNOSIS — D509 Iron deficiency anemia, unspecified: Secondary | ICD-10-CM | POA: Diagnosis not present

## 2021-10-04 DIAGNOSIS — I959 Hypotension, unspecified: Secondary | ICD-10-CM | POA: Diagnosis not present

## 2021-10-04 DIAGNOSIS — D471 Chronic myeloproliferative disease: Secondary | ICD-10-CM | POA: Diagnosis not present

## 2021-10-04 DIAGNOSIS — M1990 Unspecified osteoarthritis, unspecified site: Secondary | ICD-10-CM | POA: Diagnosis not present

## 2021-10-04 DIAGNOSIS — H269 Unspecified cataract: Secondary | ICD-10-CM | POA: Diagnosis not present

## 2021-10-04 DIAGNOSIS — Z8619 Personal history of other infectious and parasitic diseases: Secondary | ICD-10-CM | POA: Diagnosis not present

## 2021-10-04 DIAGNOSIS — Z87891 Personal history of nicotine dependence: Secondary | ICD-10-CM | POA: Diagnosis not present

## 2021-10-04 MED ORDER — ALLOPURINOL 300 MG PO TABS: 300.0000 mg | ORAL_TABLET | Freq: Every day | ORAL | 3 refills | Status: DC

## 2021-10-05 LAB — SURGICAL PATHOLOGY

## 2021-10-07 ENCOUNTER — Other Ambulatory Visit (HOSPITAL_COMMUNITY): Payer: Self-pay

## 2021-10-07 ENCOUNTER — Other Ambulatory Visit (HOSPITAL_COMMUNITY): Payer: Self-pay | Admitting: Pharmacist

## 2021-10-07 DIAGNOSIS — D471 Chronic myeloproliferative disease: Secondary | ICD-10-CM

## 2021-10-07 MED ORDER — RUXOLITINIB PHOSPHATE 15 MG PO TABS: 15.0000 mg | ORAL_TABLET | Freq: Two times a day (BID) | ORAL | 3 refills | Status: DC

## 2021-10-07 NOTE — Progress Notes (Signed)
Prescription redirected to assistance pharmacy providing the medication to the patient for free.

## 2021-10-08 ENCOUNTER — Encounter: Payer: Self-pay | Admitting: Family Medicine

## 2021-10-08 ENCOUNTER — Ambulatory Visit (INDEPENDENT_AMBULATORY_CARE_PROVIDER_SITE_OTHER): Payer: Medicare Other | Admitting: Family Medicine

## 2021-10-08 VITALS — BP 170/82 | HR 71 | Ht 65.0 in | Wt 131.0 lb

## 2021-10-08 DIAGNOSIS — F5105 Insomnia due to other mental disorder: Secondary | ICD-10-CM | POA: Diagnosis not present

## 2021-10-08 DIAGNOSIS — I10 Essential (primary) hypertension: Secondary | ICD-10-CM | POA: Diagnosis not present

## 2021-10-08 DIAGNOSIS — F418 Other specified anxiety disorders: Secondary | ICD-10-CM

## 2021-10-08 DIAGNOSIS — N3090 Cystitis, unspecified without hematuria: Secondary | ICD-10-CM

## 2021-10-08 LAB — POCT URINALYSIS DIP (CLINITEK)
Bilirubin, UA: NEGATIVE
Glucose, UA: NEGATIVE mg/dL
Ketones, POC UA: NEGATIVE mg/dL
Nitrite, UA: NEGATIVE
POC PROTEIN,UA: 100 — AB
Spec Grav, UA: 1.015 (ref 1.010–1.025)
Urobilinogen, UA: 0.2 E.U./dL
pH, UA: 6 (ref 5.0–8.0)

## 2021-10-08 MED ORDER — AMPICILLIN 500 MG PO CAPS: ORAL_CAPSULE | ORAL | 0 refills | Status: DC

## 2021-10-08 MED ORDER — LOSARTAN POTASSIUM 50 MG PO TABS: 50.0000 mg | ORAL_TABLET | Freq: Every day | ORAL | 2 refills | Status: DC

## 2021-10-08 NOTE — Patient Instructions (Addendum)
F/U in 3 weeks, call if you need me before  You are treated with ampicillin or presumed urinary tract infection causing blood in urine. We will be in touch next week with more  information   Increase cozaar to 50 mg oN dailu as blood pressure is high  We will order rollator walker for ambulation

## 2021-10-10 ENCOUNTER — Encounter: Payer: Self-pay | Admitting: Family Medicine

## 2021-10-10 DIAGNOSIS — N3090 Cystitis, unspecified without hematuria: Secondary | ICD-10-CM | POA: Insufficient documentation

## 2021-10-10 MED ORDER — TEMAZEPAM 30 MG PO CAPS: 30.0000 mg | ORAL_CAPSULE | Freq: Every evening | ORAL | 5 refills | Status: DC | PRN

## 2021-10-10 NOTE — Assessment & Plan Note (Signed)
Symptomatic with abnormal UA, snd for c/s , ampicillin prescribed, close follow up

## 2021-10-10 NOTE — Assessment & Plan Note (Signed)
Uncontrolled , increase cozaar to 50 mg daily, re eval in 3 weeks DASH diet and commitment to daily physical activity for a minimum of 30 minutes discussed and encouraged, as a part of hypertension management. The importance of attaining a healthy weight is also discussed.     10/08/2021   11:33 AM 10/08/2021   11:29 AM 09/27/2021    2:13 PM 09/20/2021    1:19 PM 09/10/2021    8:40 AM 09/10/2021    8:13 AM 09/10/2021    8:05 AM  BP/Weight  Systolic BP 623 762 831 [phone] 710  Diastolic BP 82 80 73 65 70 78 77  Wt. (Lbs)  131 129.5    131  BMI  21.8 kg/m2 21.55 kg/m2    21.8 kg/m2

## 2021-10-10 NOTE — Progress Notes (Signed)
   Renee Waters     MRN: [id number]      DOB: 1945/12/07   HPI Renee Waters is here for follow up and re-evaluation of chronic medical conditions,  in particular uncontrolled blood pressure, medication management and review of any available recent lab and radiology data.  3 day h/o blood in urine wit frequency and  discomfort, denies fever, chills or flank pain. No nausea or vomiting Overwhelmed with her health, feels as though she is not being told what is going on with her body  C/o uncontrolled blood pressure, has been checking at home and has a log with high rather than low or normal values C/o difficulty with ambulation due to chronic back pain , lower extremity weakness and fatigue from chronic illness. Gets tired easily, and needs to rest requesting a rollator walker for ambulation, we will work on this ROS Denies recent fever or chills. Denies sinus pressure, nasal congestion, ear pain or sore throat. Denies chest congestion, productive cough or wheezing. Denies chest pains, palpitations and leg swelling Denies abdominal pain, nausea, vomiting,diarrhea or constipation.    PE  BP (!) 170/82 (BP Location: Right Arm, Cuff Size: Normal)   Pulse 71   Ht '5\' 5"'$  (1.651 m)   Wt 131 lb (59.4 kg)   SpO2 93%   BMI 21.80 kg/m   Patient alert and oriented .  HEENT: No facial asymmetry, EOMI,     Neck decreased ROM .  Chest: Clear to auscultation bilaterally.  CVS: S1, S2 no murmurs, no S3.Regular rate.  ABD: Soft non tender. No renal angle or suprapubic tenderness  Ext: No edema  MS: decreased  ROM spine, shoulders, hips and knees.  Skin: Intact, no ulcerations or rash noted.  Psych: Good eye contact,  Memory intact  anxious and mildly  depressed appearing.  CNS: CN 2-12 intact, power,  normal throughout.no focal deficits noted.   Assessment & Plan  Cystitis Symptomatic with abnormal UA, snd for c/s , ampicillin prescribed, close follow up  Essential  hypertension Uncontrolled , increase cozaar to 50 mg daily, re eval in 3 weeks DASH diet and commitment to daily physical activity for a minimum of 30 minutes discussed and encouraged, as a part of hypertension management. The importance of attaining a healthy weight is also discussed.     10/08/2021   11:33 AM 10/08/2021   11:29 AM 09/27/2021    2:13 PM 09/20/2021    1:19 PM 09/10/2021    8:40 AM 09/10/2021    8:13 AM 09/10/2021    8:05 AM  BP/Weight  Systolic BP [phone] 481  Diastolic BP 82 80 73 65 70 78 77  Wt. (Lbs)  131 129.5    131  BMI  21.8 kg/m2 21.55 kg/m2    21.8 kg/m2       Insomnia secondary to depression with anxiety Continue current medication

## 2021-10-10 NOTE — Assessment & Plan Note (Signed)
Continue current medication.

## 2021-10-13 DIAGNOSIS — M47812 Spondylosis without myelopathy or radiculopathy, cervical region: Secondary | ICD-10-CM | POA: Diagnosis not present

## 2021-10-13 DIAGNOSIS — G894 Chronic pain syndrome: Secondary | ICD-10-CM | POA: Diagnosis not present

## 2021-10-13 DIAGNOSIS — M47816 Spondylosis without myelopathy or radiculopathy, lumbar region: Secondary | ICD-10-CM | POA: Diagnosis not present

## 2021-10-13 DIAGNOSIS — Z79891 Long term (current) use of opiate analgesic: Secondary | ICD-10-CM | POA: Diagnosis not present

## 2021-10-13 LAB — URINE CULTURE

## 2021-10-14 ENCOUNTER — Telehealth: Payer: Self-pay | Admitting: Family Medicine

## 2021-10-14 NOTE — Telephone Encounter (Signed)
Patient LVM returning call  ?

## 2021-10-15 NOTE — Telephone Encounter (Signed)
LVM for pt to call the office.

## 2021-10-15 NOTE — Telephone Encounter (Signed)
Pt returning call

## 2021-10-18 ENCOUNTER — Inpatient Hospital Stay: Payer: Medicare Other | Attending: Hematology

## 2021-10-18 DIAGNOSIS — D471 Chronic myeloproliferative disease: Secondary | ICD-10-CM | POA: Insufficient documentation

## 2021-10-18 DIAGNOSIS — E876 Hypokalemia: Secondary | ICD-10-CM | POA: Insufficient documentation

## 2021-10-18 DIAGNOSIS — L299 Pruritus, unspecified: Secondary | ICD-10-CM | POA: Diagnosis not present

## 2021-10-18 DIAGNOSIS — N189 Chronic kidney disease, unspecified: Secondary | ICD-10-CM | POA: Diagnosis not present

## 2021-10-18 DIAGNOSIS — D72829 Elevated white blood cell count, unspecified: Secondary | ICD-10-CM

## 2021-10-18 DIAGNOSIS — Z79899 Other long term (current) drug therapy: Secondary | ICD-10-CM | POA: Diagnosis not present

## 2021-10-18 LAB — CBC WITH DIFFERENTIAL/PLATELET
Abs Immature Granulocytes: 23.1 10*3/uL — ABNORMAL HIGH (ref 0.00–0.07)
Band Neutrophils: 11 %
Basophils Absolute: 0 10*3/uL (ref 0.0–0.1)
Basophils Relative: 0 %
Blasts: 7 %
Eosinophils Absolute: 0 10*3/uL (ref 0.0–0.5)
Eosinophils Relative: 0 %
HCT: 20.6 % — ABNORMAL LOW (ref 36.0–46.0)
Hemoglobin: 6.7 g/dL — CL (ref 12.0–15.0)
Lymphocytes Relative: 7 %
Lymphs Abs: 8.1 10*3/uL — ABNORMAL HIGH (ref 0.7–4.0)
MCH: 30.9 pg (ref 26.0–34.0)
MCHC: 32.5 g/dL (ref 30.0–36.0)
MCV: 94.9 fL (ref 80.0–100.0)
Metamyelocytes Relative: 9 %
Monocytes Absolute: 6.9 10*3/uL — ABNORMAL HIGH (ref 0.1–1.0)
Monocytes Relative: 6 %
Myelocytes: 8 %
Neutro Abs: 69.4 10*3/uL — ABNORMAL HIGH (ref 1.7–7.7)
Neutrophils Relative %: 49 %
Platelets: 108 10*3/uL — ABNORMAL LOW (ref 150–400)
Promyelocytes Relative: 3 %
RBC: 2.17 MIL/uL — ABNORMAL LOW (ref 3.87–5.11)
RDW: 21.3 % — ABNORMAL HIGH (ref 11.5–15.5)
WBC: 115.7 10*3/uL (ref 4.0–10.5)
nRBC: 2 /100 WBC — ABNORMAL HIGH
nRBC: 3.4 % — ABNORMAL HIGH (ref 0.0–0.2)

## 2021-10-18 LAB — SAMPLE TO BLOOD BANK

## 2021-10-18 LAB — PREPARE RBC (CROSSMATCH)

## 2021-10-18 MED ORDER — SODIUM CHLORIDE 0.9% FLUSH
10.0000 mL | Freq: Once | INTRAVENOUS | Status: AC
  Administered 2021-10-18: 10 mL via INTRAVENOUS

## 2021-10-18 MED ORDER — HEPARIN SOD (PORK) LOCK FLUSH 100 UNIT/ML IV SOLN
500.0000 [IU] | Freq: Once | INTRAVENOUS | Status: AC
  Administered 2021-10-18: 500 [IU] via INTRAVENOUS

## 2021-10-18 NOTE — Patient Instructions (Signed)
[facility]  Discharge Instructions: Thank you for choosing [facility] to provide your oncology and hematology care.  If you have a lab appointment with the Cancer Center, please come in thru the Main Entrance and check in at the main information desk.  Wear comfortable clothing and clothing appropriate for easy access to any Portacath or PICC line.   We strive to give you quality time with your provider. You may need to reschedule your appointment if you arrive late (15 or more minutes).  Arriving late affects you and other patients whose appointments are after yours.  Also, if you miss three or more appointments without notifying the office, you may be dismissed from the clinic at the provider's discretion.      For prescription refill requests, have your pharmacy contact our office and allow 72 hours for refills to be completed.     To help prevent nausea and vomiting after your treatment, we encourage you to take your nausea medication as directed.  BELOW ARE SYMPTOMS THAT SHOULD BE REPORTED IMMEDIATELY: *FEVER GREATER THAN 100.4 F (38 C) OR HIGHER *CHILLS OR SWEATING *NAUSEA AND VOMITING THAT IS NOT CONTROLLED WITH YOUR NAUSEA MEDICATION *UNUSUAL SHORTNESS OF BREATH *UNUSUAL BRUISING OR BLEEDING *URINARY PROBLEMS (pain or burning when urinating, or frequent urination) *BOWEL PROBLEMS (unusual diarrhea, constipation, pain near the anus) TENDERNESS IN MOUTH AND THROAT WITH OR WITHOUT PRESENCE OF ULCERS (sore throat, sores in mouth, or a toothache) UNUSUAL RASH, SWELLING OR PAIN  UNUSUAL VAGINAL DISCHARGE OR ITCHING   Items with * indicate a potential emergency and should be followed up as soon as possible or go to the Emergency Department if any problems should occur.  Please show the CHEMOTHERAPY ALERT CARD or IMMUNOTHERAPY ALERT CARD at check-in to the Emergency Department and triage nurse.  Should you have questions after your visit or need to  cancel or reschedule your appointment, please contact [facility] [phone]  and follow the prompts.  Office hours are 8:00 a.m. to 4:30 p.m. Monday - Friday. Please note that voicemails left after 4:00 p.m. may not be returned until the following business day.  We are closed weekends and major holidays. You have access to a nurse at all times for urgent questions. Please call the main number to the clinic [phone] and follow the prompts.  For any non-urgent questions, you may also contact your provider using MyChart. We now offer e-Visits for anyone 18 and older to request care online for non-urgent symptoms. For details visit [email or web address].   Also download the MyChart app! Go to the app store, search "MyChart", open the app, select [facility], and log in with your MyChart username and password.  Masks are optional in the cancer centers. If you would like for your care team to wear a mask while they are taking care of you, please let them know. You may have one support person who is at least 76 years old accompany you for your appointments.  

## 2021-10-18 NOTE — Progress Notes (Signed)
CRITICAL VALUE ALERT Critical value received:  WBC 115.7, hgb 6.7 Date of notification:  10-18-21 Time of notification: 6222 Critical value read back:  Yes.   Nurse who received alert:  C. Kimbra Marcelino RN MD notified time and response:  [phone], will give one unit of blood tomorrow per orders.

## 2021-10-18 NOTE — Progress Notes (Signed)
Patients port flushed without difficulty.  Good blood return noted with no bruising or swelling noted at site.  Band aid applied.  VSS with discharge and left in satisfactory condition with no s/s of distress noted.   

## 2021-10-18 NOTE — Addendum Note (Signed)
Addended by: Donnie Aho on: 10/18/2021 03:17 PM   Modules accepted: Orders

## 2021-10-19 ENCOUNTER — Observation Stay (HOSPITAL_COMMUNITY)
Admission: EM | Admit: 2021-10-19 | Discharge: 2021-10-20 | Disposition: A | Discharge status: Home / Self Care | Payer: Medicare Other | Attending: Family Medicine | Admitting: Family Medicine

## 2021-10-19 ENCOUNTER — Encounter (HOSPITAL_COMMUNITY): Payer: Self-pay | Admitting: Emergency Medicine

## 2021-10-19 ENCOUNTER — Other Ambulatory Visit: Payer: Self-pay

## 2021-10-19 ENCOUNTER — Emergency Department (HOSPITAL_COMMUNITY): Payer: Medicare Other

## 2021-10-19 ENCOUNTER — Inpatient Hospital Stay: Payer: Medicare Other

## 2021-10-19 DIAGNOSIS — R001 Bradycardia, unspecified: Secondary | ICD-10-CM

## 2021-10-19 DIAGNOSIS — I10 Essential (primary) hypertension: Secondary | ICD-10-CM | POA: Diagnosis not present

## 2021-10-19 DIAGNOSIS — Z96641 Presence of right artificial hip joint: Secondary | ICD-10-CM | POA: Diagnosis not present

## 2021-10-19 DIAGNOSIS — E860 Dehydration: Secondary | ICD-10-CM | POA: Diagnosis not present

## 2021-10-19 DIAGNOSIS — I517 Cardiomegaly: Secondary | ICD-10-CM

## 2021-10-19 DIAGNOSIS — N3000 Acute cystitis without hematuria: Secondary | ICD-10-CM | POA: Diagnosis not present

## 2021-10-19 DIAGNOSIS — N183 Chronic kidney disease, stage 3 unspecified: Secondary | ICD-10-CM | POA: Diagnosis not present

## 2021-10-19 DIAGNOSIS — R531 Weakness: Secondary | ICD-10-CM | POA: Diagnosis not present

## 2021-10-19 DIAGNOSIS — D72829 Elevated white blood cell count, unspecified: Secondary | ICD-10-CM | POA: Diagnosis not present

## 2021-10-19 DIAGNOSIS — Z20822 Contact with and (suspected) exposure to covid-19: Secondary | ICD-10-CM | POA: Diagnosis not present

## 2021-10-19 DIAGNOSIS — Z87891 Personal history of nicotine dependence: Secondary | ICD-10-CM | POA: Diagnosis not present

## 2021-10-19 DIAGNOSIS — R059 Cough, unspecified: Secondary | ICD-10-CM | POA: Diagnosis not present

## 2021-10-19 DIAGNOSIS — D649 Anemia, unspecified: Secondary | ICD-10-CM

## 2021-10-19 DIAGNOSIS — I160 Hypertensive urgency: Secondary | ICD-10-CM | POA: Diagnosis not present

## 2021-10-19 DIAGNOSIS — I959 Hypotension, unspecified: Secondary | ICD-10-CM | POA: Diagnosis not present

## 2021-10-19 DIAGNOSIS — K219 Gastro-esophageal reflux disease without esophagitis: Secondary | ICD-10-CM | POA: Diagnosis present

## 2021-10-19 DIAGNOSIS — I129 Hypertensive chronic kidney disease with stage 1 through stage 4 chronic kidney disease, or unspecified chronic kidney disease: Secondary | ICD-10-CM | POA: Diagnosis not present

## 2021-10-19 DIAGNOSIS — N179 Acute kidney failure, unspecified: Secondary | ICD-10-CM

## 2021-10-19 DIAGNOSIS — D471 Chronic myeloproliferative disease: Secondary | ICD-10-CM | POA: Diagnosis not present

## 2021-10-19 DIAGNOSIS — J811 Chronic pulmonary edema: Secondary | ICD-10-CM | POA: Diagnosis not present

## 2021-10-19 DIAGNOSIS — N390 Urinary tract infection, site not specified: Secondary | ICD-10-CM

## 2021-10-19 DIAGNOSIS — D696 Thrombocytopenia, unspecified: Secondary | ICD-10-CM

## 2021-10-19 DIAGNOSIS — R0989 Other specified symptoms and signs involving the circulatory and respiratory systems: Secondary | ICD-10-CM | POA: Diagnosis present

## 2021-10-19 DIAGNOSIS — R7401 Elevation of levels of liver transaminase levels: Secondary | ICD-10-CM | POA: Diagnosis not present

## 2021-10-19 DIAGNOSIS — Z79899 Other long term (current) drug therapy: Secondary | ICD-10-CM | POA: Insufficient documentation

## 2021-10-19 DIAGNOSIS — I951 Orthostatic hypotension: Secondary | ICD-10-CM

## 2021-10-19 LAB — URINALYSIS, ROUTINE W REFLEX MICROSCOPIC
Bilirubin Urine: NEGATIVE
Glucose, UA: NEGATIVE mg/dL
Hgb urine dipstick: NEGATIVE
Ketones, ur: NEGATIVE mg/dL
Nitrite: NEGATIVE
Protein, ur: NEGATIVE mg/dL
Specific Gravity, Urine: 1.01 (ref 1.005–1.030)
WBC, UA: >50 WBC/hpf — ABNORMAL HIGH (ref 0–5)
pH: 5 (ref 5.0–8.0)

## 2021-10-19 LAB — COMPREHENSIVE METABOLIC PANEL
ALT: 34 U/L (ref 0–44)
AST: 57 U/L — ABNORMAL HIGH (ref 15–41)
Albumin: 3.6 g/dL (ref 3.5–5.0)
Alkaline Phosphatase: 184 U/L — ABNORMAL HIGH (ref 38–126)
Anion gap: 5 (ref 5–15)
BUN: 38 mg/dL — ABNORMAL HIGH (ref 8–23)
CO2: 26 mmol/L (ref 22–32)
Calcium: 8.5 mg/dL — ABNORMAL LOW (ref 8.9–10.3)
Chloride: 109 mmol/L (ref 98–111)
Creatinine, Ser: 1.95 mg/dL — ABNORMAL HIGH (ref 0.44–1.00)
GFR, Estimated: 26 mL/min — ABNORMAL LOW (ref >60)
Glucose, Bld: 89 mg/dL (ref 70–99)
Potassium: 4.6 mmol/L (ref 3.5–5.1)
Sodium: 140 mmol/L (ref 135–145)
Total Bilirubin: 1 mg/dL (ref 0.3–1.2)
Total Protein: 7.2 g/dL (ref 6.5–8.1)

## 2021-10-19 LAB — POC OCCULT BLOOD, ED: Fecal Occult Blood, POC: NEGATIVE

## 2021-10-19 LAB — MAGNESIUM: Magnesium: 2.2 mg/dL (ref 1.7–2.4)

## 2021-10-19 LAB — SARS CORONAVIRUS 2 BY RT PCR: SARS Coronavirus 2 by RT PCR: NEGATIVE

## 2021-10-19 MED ORDER — PANTOPRAZOLE SODIUM 40 MG PO TBEC
40.0000 mg | DELAYED_RELEASE_TABLET | Freq: Every day | ORAL | Status: DC
  Administered 2021-10-20: 40 mg via ORAL
  Filled 2021-10-19: qty 1

## 2021-10-19 MED ORDER — RUXOLITINIB PHOSPHATE 15 MG PO TABS: 15.0000 mg | ORAL_TABLET | Freq: Two times a day (BID) | ORAL | Status: DC

## 2021-10-19 MED ORDER — HYDRALAZINE HCL 20 MG/ML IJ SOLN: 10.0000 mg | Freq: Four times a day (QID) | INTRAMUSCULAR | Status: DC | PRN

## 2021-10-19 MED ORDER — ACETAMINOPHEN 325 MG PO TABS
650.0000 mg | ORAL_TABLET | Freq: Once | ORAL | Status: AC
  Administered 2021-10-19: 650 mg via ORAL
  Filled 2021-10-19: qty 2

## 2021-10-19 MED ORDER — HEPARIN SOD (PORK) LOCK FLUSH 100 UNIT/ML IV SOLN: 500.0000 [IU] | Freq: Every day | INTRAVENOUS | Status: DC | PRN

## 2021-10-19 MED ORDER — SODIUM CHLORIDE 0.9 % IV SOLN: INTRAVENOUS | Status: DC

## 2021-10-19 MED ORDER — SODIUM CHLORIDE 0.9 % IV SOLN: 1.0000 g | INTRAVENOUS | Status: DC

## 2021-10-19 MED ORDER — SODIUM CHLORIDE 0.9% FLUSH: 10.0000 mL | INTRAVENOUS | Status: DC | PRN

## 2021-10-19 MED ORDER — ENOXAPARIN SODIUM 30 MG/0.3ML IJ SOSY
30.0000 mg | PREFILLED_SYRINGE | INTRAMUSCULAR | Status: DC
  Administered 2021-10-19: 30 mg via SUBCUTANEOUS
  Filled 2021-10-19: qty 0.3

## 2021-10-19 MED ORDER — SODIUM CHLORIDE 0.9 % IV SOLN
1.0000 g | Freq: Once | INTRAVENOUS | Status: AC
  Administered 2021-10-19: 1 g via INTRAVENOUS
  Filled 2021-10-19: qty 10

## 2021-10-19 MED ORDER — ALLOPURINOL 100 MG PO TABS
100.0000 mg | ORAL_TABLET | Freq: Every day | ORAL | Status: DC
  Administered 2021-10-20: 100 mg via ORAL
  Filled 2021-10-19: qty 1

## 2021-10-19 MED ORDER — SODIUM CHLORIDE 0.9% IV SOLUTION: Freq: Once | INTRAVENOUS | Status: DC

## 2021-10-19 MED ORDER — SODIUM CHLORIDE 0.9% IV SOLUTION
250.0000 mL | Freq: Once | INTRAVENOUS | Status: AC
  Administered 2021-10-19: 250 mL via INTRAVENOUS

## 2021-10-19 MED ORDER — DIPHENHYDRAMINE HCL 25 MG PO CAPS
25.0000 mg | ORAL_CAPSULE | Freq: Once | ORAL | Status: DC
  Filled 2021-10-19: qty 1

## 2021-10-19 MED ORDER — MELATONIN 3 MG PO TABS: 6.0000 mg | ORAL_TABLET | Freq: Once | ORAL | Status: DC

## 2021-10-19 MED ORDER — TIZANIDINE HCL 4 MG PO TABS
4.0000 mg | ORAL_TABLET | Freq: Three times a day (TID) | ORAL | Status: DC | PRN
  Administered 2021-10-19 – 2021-10-20 (×2): 4 mg via ORAL
  Filled 2021-10-19 (×2): qty 1

## 2021-10-19 MED ORDER — LOSARTAN POTASSIUM 50 MG PO TABS
50.0000 mg | ORAL_TABLET | Freq: Every day | ORAL | Status: DC
  Administered 2021-10-20: 50 mg via ORAL
  Filled 2021-10-19: qty 1

## 2021-10-19 NOTE — ED Provider Notes (Addendum)
[facility] Provider Note   CSN: [id number] Arrival date & time: 10/19/21  1309     History  Chief Complaint  Patient presents with   Hypotension    Renee Waters is a 76 y.o. female.  Pt with generalized weakness, fatigue, in the past several days. Hx myeloproliferative neoplastic disorder, chronic anemia, prior transfusions. Was seen in oncology clinic today, was found to have hgb 6.7, was given unit prbc, but appeared weak, so was sent to ED. Pt denies focal c/o. No headache. No chest pain or discomfort. No sob. Occasional non prod cough. No sore throat. No sinus pain. No known ill contacts. No abd pain or vomiting/diarrhea. No fever or chills. No dysuria.   The history is provided by the patient, medical records and a relative.       Home Medications Prior to Admission medications   Medication Sig Start Date End Date Taking? Authorizing Provider  acyclovir (ZOVIRAX) 800 MG tablet Take 1 tablet (800 mg total) by mouth 5 (five) times daily. 04/21/21   [name], MD  acyclovir ointment (ZOVIRAX) 5 % Apply 1 application. topically every 3 (three) hours. 04/21/21   [name], MD  allopurinol (ZYLOPRIM) 300 MG tablet Take 1 tablet (300 mg total) by mouth daily. 10/04/21   Derek Jack, MD  ampicillin (PRINCIPEN) 500 MG capsule Take one capsule by mouth two times daily for 1 week 10/08/21   [name], MD  citalopram (CELEXA) 20 MG tablet Take 1 tablet by mouth once daily 08/30/21   [name], MD  EPINEPHRINE 0.3 mg/0.3 mL IJ SOAJ injection INJECT 0.3 MLS INTO MUSCLE ONCE AS NEEDED Patient taking differently: Inject 0.3 mg into the muscle as needed for anaphylaxis. 10/04/16   [name], MD  furosemide (LASIX) 20 MG tablet Take 1 tablet (20 mg total) by mouth daily as needed. 09/06/21   Derek Jack, MD  gabapentin (NEURONTIN) 300 MG capsule Take one capsule by mouth once daily, as needed, for pain 03/30/21    [name], MD  lidocaine-prilocaine (EMLA) cream Apply 1 application. topically as needed. 06/18/21   Derek Jack, MD  losartan (COZAAR) 25 MG tablet Take 1 tablet by mouth once daily for blood pressure 04/26/21   [name], MD  losartan (COZAAR) 50 MG tablet Take 1 tablet (50 mg total) by mouth daily. 10/08/21   [name], MD  meloxicam (MOBIC) 7.5 MG tablet Take 1 tablet (7.5 mg total) by mouth daily as needed for pain (knee pain). 01/28/21   Carole Civil, MD  metoprolol tartrate (LOPRESSOR) 25 MG tablet Take one and half pill making  37.5 mg twice daily 09/10/21   Paseda, Dewaine Conger, FNP  mirtazapine (REMERON) 15 MG tablet Take 1 tablet (15 mg total) by mouth at bedtime. 06/07/21   [name], MD  oxyCODONE (OXY IR/ROXICODONE) 5 MG immediate release tablet Take 5 mg by mouth every 4 (four) hours as needed. 04/22/21   [provider]  pantoprazole (PROTONIX) 40 MG tablet TAKE 1 TABLET BY MOUTH ONCE DAILY BEFORE BREAKFAST 08/30/21   Sherron Monday, NP  potassium chloride (KLOR-CON) 10 MEQ tablet Take 1 tablet by mouth once daily 08/06/21   Derek Jack, MD  ruxolitinib phosphate (JAKAFI) 15 MG tablet Take 1 tablet (15 mg total) by mouth 2 (two) times daily. 10/07/21   Derek Jack, MD  temazepam (RESTORIL) 30 MG capsule Take 1 capsule (30 mg total) by mouth at  bedtime as needed for sleep. 03/09/21   [name], MD  temazepam (RESTORIL) 30 MG capsule Take 1 capsule (30 mg total) by mouth at bedtime. 09/14/21   [name], MD  temazepam (RESTORIL) 30 MG capsule Take 1 capsule (30 mg total) by mouth at bedtime as needed for sleep. 10/14/21   [name], MD  tiZANidine (ZANAFLEX) 4 MG tablet Take 2 tablets (8 mg total) by mouth 3 (three) times daily as needed (spasms). 09/24/21   [name], MD      Allergies    Bee venom, Acetaminophen, Amlodipine, Tyloxapol, Other, and Oxycodone-acetaminophen     Review of Systems   Review of Systems  Constitutional:  Negative for chills and fever.  HENT:  Negative for sore throat.   Eyes:  Negative for pain, redness and visual disturbance.  Respiratory:  Positive for cough. Negative for shortness of breath.   Cardiovascular:  Negative for chest pain and leg swelling.  Gastrointestinal:  Negative for abdominal pain, diarrhea and vomiting.  Genitourinary:  Negative for dysuria and flank pain.  Musculoskeletal:  Negative for back pain and neck pain.  Skin:  Negative for rash.  Neurological:  Negative for headaches.  Hematological:  Does not bruise/bleed easily.  Psychiatric/Behavioral:  Negative for confusion.     Physical Exam Updated Vital Signs BP (!) 98/51   Pulse (!) 46   Temp 97.6 F (36.4 C) (Oral)   Resp 17   Ht 1.651 m ('5\' 5"'$ )   Wt 60 kg   SpO2 95%   BMI 22.01 kg/m  Physical Exam Vitals and nursing note reviewed.  Constitutional:      Appearance: Normal appearance. She is well-developed.  HENT:     Head: Atraumatic.     Nose: Nose normal.     Mouth/Throat:     Mouth: Mucous membranes are moist.  Eyes:     General: No scleral icterus.    Pupils: Pupils are equal, round, and reactive to light.     Comments: Conj pale.   Neck:     Vascular: No carotid bruit.     Trachea: No tracheal deviation.     Comments: No stiffness or rigidity.  Cardiovascular:     Rate and Rhythm: Regular rhythm. Bradycardia present.     Pulses: Normal pulses.     Heart sounds: Normal heart sounds. No murmur heard.    No friction rub. No gallop.  Pulmonary:     Effort: Pulmonary effort is normal. No respiratory distress.     Breath sounds: Normal breath sounds.     Comments: Port right chest without sign of infection.  Abdominal:     General: Bowel sounds are normal. There is no distension.     Palpations: Abdomen is soft.     Tenderness: There is no abdominal tenderness. There is no guarding.  Genitourinary:    Comments: No cva  tenderness. Rectal (chaperoned, and daughter in room) - light brown stool, sent for hemoccult.  Musculoskeletal:        General: No swelling or tenderness.     Cervical back: Normal range of motion and neck supple. No rigidity. No muscular tenderness.     Right lower leg: No edema.     Left lower leg: No edema.  Skin:    General: Skin is warm and dry.     Findings: No rash.  Neurological:     Mental Status: She is alert.     Comments: Alert, speech normal.  Motor/sens grossly intact bil.   Psychiatric:        Mood and Affect: Mood normal.     ED Results / Procedures / Treatments   Labs (all labs ordered are listed, but only abnormal results are displayed) Results for orders placed or performed during the hospital encounter of 10/19/21  SARS Coronavirus 2 by RT PCR (hospital order, performed in [facility] hospital lab) *cepheid single result test* Anterior Nasal Swab   Specimen: Anterior Nasal Swab  Result Value Ref Range   SARS Coronavirus 2 by RT PCR NEGATIVE NEGATIVE  Magnesium  Result Value Ref Range   Magnesium 2.2 1.7 - 2.4 mg/dL  Comprehensive metabolic panel  Result Value Ref Range   Sodium 140 135 - 145 mmol/L   Potassium 4.6 3.5 - 5.1 mmol/L   Chloride 109 98 - 111 mmol/L   CO2 26 22 - 32 mmol/L   Glucose, Bld 89 70 - 99 mg/dL   BUN 38 (H) 8 - 23 mg/dL   Creatinine, Ser 1.95 (H) 0.44 - 1.00 mg/dL   Calcium 8.5 (L) 8.9 - 10.3 mg/dL   Total Protein 7.2 6.5 - 8.1 g/dL   Albumin 3.6 3.5 - 5.0 g/dL   AST 57 (H) 15 - 41 U/L   ALT 34 0 - 44 U/L   Alkaline Phosphatase 184 (H) 38 - 126 U/L   Total Bilirubin 1.0 0.3 - 1.2 mg/dL   GFR, Estimated 26 (L) >60 mL/min   Anion gap 5 5 - 15   *Note: Due to a large number of results and/or encounters for the requested time period, some results have not been displayed. A complete set of results can be found in Results Review.   US SPLEEN (ABDOMEN LIMITED)  Result Date: 09/21/2021 CLINICAL DATA:  Evaluate for splenomegaly.  EXAM: ULTRASOUND ABDOMEN LIMITED COMPARISON:  CT abdomen pelvis 08/25/2021 FINDINGS: Targeted ultrasound of the spleen is performed. The spleen is again mildly enlarged measuring 13.6 x 6.6 x 11.3 cm. Splenic volume measures 532 mL. IMPRESSION: Mild splenomegaly. Electronically Signed   By: Audie Pinto M.D.   On: 09/21/2021 15:55     EKG EKG Interpretation  Date/Time:  Tuesday October 19 2021 16:55:16 EDT Ventricular Rate:  42 PR Interval:  213 QRS Duration: 97 QT Interval:  522 QTC Calculation: 437 R Axis:   43 Text Interpretation: Sinus bradycardia Non-specific ST-t changes Confirmed by Lajean Saver [phone]) on 10/19/2021 6:09:42 PM  Radiology DG Chest 2 View  Result Date: 10/19/2021 CLINICAL DATA:  Cough, anemia, low blood pressure EXAM: CHEST - 2 VIEW COMPARISON:  09/02/2021 FINDINGS: RIGHT jugular Port-A-Cath with tip projecting over SVC. Enlargement of cardiac silhouette with pulmonary vascular congestion. Mediastinal contours normal. Minimal chronic interstitial prominence stable. No pulmonary infiltrate, pleural effusion, or pneumothorax. No osseous abnormalities. IMPRESSION: Enlargement of cardiac silhouette with pulmonary vascular congestion. Minimal chronic interstitial prominence without acute infiltrate. Electronically Signed   By: Lavonia Dana M.D.   On: 10/19/2021 16:41    Procedures Procedures    Medications Ordered in ED Medications  0.9 %  sodium chloride infusion (Manually program via Guardrails IV Fluids) (has no administration in time range)    ED Course/ Medical Decision Making/ A&P                           Medical Decision Making Problems Addressed: Acute UTI: acute illness or injury with systemic symptoms that poses a threat to life or bodily functions  AKI (acute kidney injury) [facility]): acute illness or injury Enlargement of cardiac chamber on chest x-ray: acute illness or injury Generalized weakness: acute illness or injury with systemic symptoms  that poses a threat to life or bodily functions Myeloproliferative disorder [facility]): chronic illness or injury with exacerbation, progression, or side effects of treatment that poses a threat to life or bodily functions Symptomatic anemia: acute illness or injury with systemic symptoms that poses a threat to life or bodily functions Symptomatic bradycardia: acute illness or injury with systemic symptoms that poses a threat to life or bodily functions  Amount and/or Complexity of Data Reviewed Independent Historian:     Details: Family, hx External Data Reviewed: labs, ECG and notes. Labs: ordered. Decision-making details documented in ED Course. Radiology: ordered and independent interpretation performed. Decision-making details documented in ED Course. ECG/medicine tests: ordered and independent interpretation performed. Decision-making details documented in ED Course. Discussion of management or test interpretation with external provider(s): Hospitalists, discussed pt  Risk Prescription drug management. Decision regarding hospitalization.   Iv ns. Continuous pulse ox and cardiac monitoring. Labs ordered/sent. Imaging ordered.   Reviewed nursing notes and prior charts for additional history. External reports reviewed. Pt received prbc transfusion in clinic today, sent to ED with weakness and soft bp. Additional history from: daughter/family.   Cardiac monitor: sinus rhythm, rate 60.  Labs reviewed/interpreted by me - hgb 6.7, decreased from prior. Per report, received prbc in clinic today.   Xrays reviewed/interpreted by me - no pna. Enlarged cardiac silhouette.   Pt w bradycardia on monitor. Sinus. Currently hr 38, slower than on prior ecgs where hr was in 98s. No chest pain. Bp is improved w fluids.   Additional labs reviewed/interpreted by me - cr 1.95, mildly increased from prior.   Given weakness, soft bp, bradycardia, anemia - will admit. Hospitalists consulted for admission.   May benefit from echo during admission, as well as cardiology consult re bradycardia.   Currently no cp or abd pain. Chest cta. Abd soft nt.   Additional labs reviewed/interpreted by me - ua c/w uti, cx sent, rocephin iv.   CRITICAL CARE  RE: severe anemia, symptomatic, requiring prbc transfusion, aki, uti, soft bp, altered ms. Performed by: Mirna Mires Total critical care time: 40 minutes Critical care time was exclusive of separately billable procedures and treating other patients. Critical care was necessary to treat or prevent imminent or life-threatening deterioration. Critical care was time spent personally by me on the following activities: development of treatment plan with patient and/or surrogate as well as nursing, discussions with consultants, evaluation of patient's response to treatment, examination of patient, obtaining history from patient or surrogate, ordering and performing treatments and interventions, ordering and review of laboratory studies, ordering and review of radiographic studies, pulse oximetry and re-evaluation of patient's condition.      Final Clinical Impression(s) / ED Diagnoses Final diagnoses:  None    Rx / DC Orders ED Discharge Orders     None          Lajean Saver, MD 10/19/21 1859

## 2021-10-19 NOTE — Patient Instructions (Signed)
[facility]  Discharge Instructions: Thank you for choosing [facility] to provide your oncology and hematology care.  If you have a lab appointment with the Cancer Center, please come in thru the Main Entrance and check in at the main information desk.  Wear comfortable clothing and clothing appropriate for easy access to any Portacath or PICC line.   We strive to give you quality time with your provider. You may need to reschedule your appointment if you arrive late (15 or more minutes).  Arriving late affects you and other patients whose appointments are after yours.  Also, if you miss three or more appointments without notifying the office, you may be dismissed from the clinic at the provider's discretion.      For prescription refill requests, have your pharmacy contact our office and allow 72 hours for refills to be completed.     To help prevent nausea and vomiting after your treatment, we encourage you to take your nausea medication as directed.  BELOW ARE SYMPTOMS THAT SHOULD BE REPORTED IMMEDIATELY: *FEVER GREATER THAN 100.4 F (38 C) OR HIGHER *CHILLS OR SWEATING *NAUSEA AND VOMITING THAT IS NOT CONTROLLED WITH YOUR NAUSEA MEDICATION *UNUSUAL SHORTNESS OF BREATH *UNUSUAL BRUISING OR BLEEDING *URINARY PROBLEMS (pain or burning when urinating, or frequent urination) *BOWEL PROBLEMS (unusual diarrhea, constipation, pain near the anus) TENDERNESS IN MOUTH AND THROAT WITH OR WITHOUT PRESENCE OF ULCERS (sore throat, sores in mouth, or a toothache) UNUSUAL RASH, SWELLING OR PAIN  UNUSUAL VAGINAL DISCHARGE OR ITCHING   Items with * indicate a potential emergency and should be followed up as soon as possible or go to the Emergency Department if any problems should occur.  Please show the CHEMOTHERAPY ALERT CARD or IMMUNOTHERAPY ALERT CARD at check-in to the Emergency Department and triage nurse.  Should you have questions after your visit or need to  cancel or reschedule your appointment, please contact [facility] [phone]  and follow the prompts.  Office hours are 8:00 a.m. to 4:30 p.m. Monday - Friday. Please note that voicemails left after 4:00 p.m. may not be returned until the following business day.  We are closed weekends and major holidays. You have access to a nurse at all times for urgent questions. Please call the main number to the clinic [phone] and follow the prompts.  For any non-urgent questions, you may also contact your provider using MyChart. We now offer e-Visits for anyone 18 and older to request care online for non-urgent symptoms. For details visit [email or web address].   Also download the MyChart app! Go to the app store, search "MyChart", open the app, select [facility], and log in with your MyChart username and password.  Masks are optional in the cancer centers. If you would like for your care team to wear a mask while they are taking care of you, please let them know. You may have one support person who is at least 76 years old accompany you for your appointments.  

## 2021-10-19 NOTE — ED Notes (Signed)
Patient has been drinking water and tolerating well.

## 2021-10-19 NOTE — H&P (Signed)
History and Physical    Patient: Renee Waters ATF:[id number] DOB: [birth year] DOA: 10/19/2021 DOS: the patient was seen and examined on 10/19/2021 PCP: [name], MD  Patient coming from: Home  Chief Complaint:  Chief Complaint  Patient presents with   Hypotension   HPI: Renee Waters is a 76 y.o. female with medical history significant of hypertension, MPL positive myeloproliferative neoplasm, CKD, GERD, SLE, chronic anemia with prior transfusion who was sent from oncology clinic today due to generalized weakness.  Patient complained of generalized weakness over the weekend, so she presented to the oncology clinic today, blood work showed hemoglobin of 6.7 and 1 unit of PRBC was provided, however, patient continued to be weak and she was sent to the ED for further evaluation and management.  Patient endorsed that she just completed antibiotics for UTI about 2 days ago.  She denies chest pain, shortness of breath, fever, chills, headache, abdominal pain, nausea or vomiting  ED Course:  In the emergency department, she was intermittently bradycardic with HR as low as 48 bpm, BP was 137/74 on arrival to the ED, other vital signs were within normal range.  Work-up in the ED showed WBC 115.7, H/H 6.7/20.6, platelets 108.  BMP was normal except for BUN/creatinine 30/1.95 (creatinine baseline at 1.1-1.3), AST 57, ALT 34, ALP 184.  Urinalysis was positive for large leukocytes and greater than 50 WBCs.  SARS coronavirus 2 was negative. Chest x-ray showed enlargement of cardiac silhouette with pulmonary vascular congestion. Minimal chronic interstitial prominence without acute infiltrate. She was treated with IV ceftriaxone, hospitalist was asked to admit patient for further evaluation and management.   Review of Systems: Review of systems as noted in the HPI. All other systems reviewed and are negative.   Past Medical History:  Diagnosis Date   Acute cholangitis    Allergy    Anemia     Anxiety    Arthritis    Phreesia 03/18/2020   Barrett's esophagus    Cataract    Chronic back pain    Chronic neck pain    CKD (chronic kidney disease) stage 3, GFR 30-59 ml/min ([place]) 10/29/2020   Depression    Genital herpes    GERD (gastroesophageal reflux disease)    H/O degenerative disc disease    History of blood transfusion    Hypertension    Insomnia    Lupus (systemic lupus erythematosus) ([place])    Neuromuscular disorder ([place])    Osteoarthritis    S/P colonoscopy June 2005   normal, no polyps   S/P endoscopy June 2005, Oct 2009   2005: short-segment Barrett's, 2009: short-segment Barrett's   Upper respiratory tract infection 07/09/2020   UTI (lower urinary tract infection) 11/2012   Past Surgical History:  Procedure Laterality Date   ABDOMINAL HYSTERECTOMY     BACK SURGERY     BIOPSY N/A 03/20/2014   Procedure: BIOPSY;  Surgeon: Daneil Dolin, MD;  Location: [facility];  Service: Endoscopy;  Laterality: N/A;   BIOPSY  09/14/2015   Procedure: BIOPSY;  Surgeon: Daneil Dolin, MD;  Location: [facility];  Service: Endoscopy;;  esophageal and gastric   BIOPSY  07/23/2019   Procedure: BIOPSY;  Surgeon: Daneil Dolin, MD;  Location: [facility];  Service: Endoscopy;;  esophageal    CARPAL TUNNEL RELEASE Left 2013   cervical disectomy  2002   CESAREAN SECTION N/A    Phreesia 03/18/2020   CHOLECYSTECTOMY     with lysis of adhesions for  sbo; "ruptured gallbladder".   COLONOSCOPY  11/09/2011   RMR: Melanosis coli   COLONOSCOPY WITH PROPOFOL N/A 09/14/2015   Dr. Gala Romney: diverticulosis, 44m TA removed. next TCS 09/2020.    COLONOSCOPY WITH PROPOFOL N/A 04/30/2020   Procedure: COLONOSCOPY WITH PROPOFOL;  Surgeon: RDaneil Dolin MD;  Location: [facility];  Service: Endoscopy;  Laterality: N/A;  PM (ASA 3)   DENTAL SURGERY  11/2015   multiple tooth extraction   ESOPHAGOGASTRODUODENOSCOPY  11/29/2007   salmon-colored  tongue   longest stable at  3 cm, distal esophagus as  described previously status post biopsy/ Hiatal hernia, otherwise normal stomach D1 and D2   ESOPHAGOGASTRODUODENOSCOPY  01/06/11   short segment Barrett's esophagus s/p bx/Hiatal hernia   ESOPHAGOGASTRODUODENOSCOPY (EGD) WITH PROPOFOL N/A 03/20/2014   RUXL:KGMWNUUVdistal esophagus short segment barrett's, bx with no dysplasia. next egd in 03/2017   ESOPHAGOGASTRODUODENOSCOPY (EGD) WITH PROPOFOL N/A 09/14/2015   Dr. RGala Romney Barrett's without dysplasia, gastritis benign bx, hiatal hernia. next EGD 09/2018.   ESOPHAGOGASTRODUODENOSCOPY (EGD) WITH PROPOFOL N/A 07/23/2019   Procedure: ESOPHAGOGASTRODUODENOSCOPY (EGD) WITH PROPOFOL;  Surgeon: RDaneil Dolin MD;  [facility];  Service: Endoscopy;  Laterality: N/A;  3:00pm   EYE SURGERY N/A    Phreesia 03/18/2020   HERNIA REPAIR Right 07/2010   Dr. BZada Girt  IR IMAGING GUIDED PORT INSERTION  02/09/2021   JOINT REPLACEMENT     [facility]  POLYPECTOMY  09/14/2015   Procedure: POLYPECTOMY;  Surgeon: RDaneil Dolin MD;  Location: [facility];  Service: Endoscopy;;  ascending colon   right hip replacement  07/2010   went back in sept 2012 to fix   SHOULDER ARTHROSCOPY  2008   left   SPINE SURGERY N/A    Phreesia 03/18/2020   TOTAL HIP REVISION Right 12/17/2012   Procedure: RIGHT TOTAL HIP REVISION;  Surgeon: MMauri Pole MD;  Location: [facility];  Service: Orthopedics;  Laterality: Right;   WRIST SURGERY Right 2011   open reduction right wrist.    Social History:  reports that she quit smoking about 18 years ago. Her smoking use included cigarettes. She has a 6.25 pack-year smoking history. She has never used smokeless tobacco. She reports that she does not drink alcohol and does not use drugs.   Allergies  Allergen Reactions   Bee Venom Swelling and Hives   Acetaminophen     itches   Amlodipine     Hair loss   Red Dye Other (See Comments)   Tyloxapol    Other Itching, Rash and Swelling    Oxycodone-Acetaminophen Rash    Pt states, "this gives her a rash, but at home she takes oxycodone for pain relief" Pt states, "this gives her a rash, but at home she takes oxycodone for pain relief"    Family History  Problem Relation Age of Onset   Hypertension Mother    Stroke Mother    Colon cancer Neg Hx    Anesthesia problems Neg Hx    Hypotension Neg Hx    Malignant hyperthermia Neg Hx    Pseudochol deficiency Neg Hx    Gastric cancer Neg Hx    Esophageal cancer Neg Hx      Prior to Admission medications   Medication Sig Start Date End Date Taking? Authorizing Provider  acyclovir (ZOVIRAX) 800 MG tablet Take 1 tablet (800 mg total) by mouth 5 (five) times daily. 04/21/21  Yes STula Nakayama  E, MD  acyclovir ointment (ZOVIRAX) 5 % Apply 1 application. topically every 3 (three) hours. 04/21/21  Yes [name], MD  allopurinol (ZYLOPRIM) 300 MG tablet Take 1 tablet (300 mg total) by mouth daily. 10/04/21  Yes Derek Jack, MD  citalopram (CELEXA) 20 MG tablet Take 1 tablet by mouth once daily 08/30/21  Yes [name], MD  EPINEPHRINE 0.3 mg/0.3 mL IJ SOAJ injection INJECT 0.3 MLS INTO MUSCLE ONCE AS NEEDED Patient taking differently: Inject 0.3 mg into the muscle as needed for anaphylaxis. 10/04/16  Yes [name], MD  furosemide (LASIX) 20 MG tablet Take 1 tablet (20 mg total) by mouth daily as needed. 09/06/21  Yes Derek Jack, MD  lidocaine-prilocaine (EMLA) cream Apply 1 application. topically as needed. 06/18/21  Yes Derek Jack, MD  losartan (COZAAR) 50 MG tablet Take 1 tablet (50 mg total) by mouth daily. 10/08/21  Yes [name], MD  meloxicam (MOBIC) 7.5 MG tablet Take 1 tablet (7.5 mg total) by mouth daily as needed for pain (knee pain). 01/28/21  Yes Carole Civil, MD  metoprolol tartrate (LOPRESSOR) 25 MG tablet Take one and half pill making  37.5 mg twice daily 09/10/21  Yes Paseda, Dewaine Conger, FNP   mirtazapine (REMERON) 15 MG tablet Take 1 tablet (15 mg total) by mouth at bedtime. 06/07/21  Yes [name], MD  oxyCODONE (OXY IR/ROXICODONE) 5 MG immediate release tablet Take 5 mg by mouth every 4 (four) hours as needed. 04/22/21  Yes [provider]  pantoprazole (PROTONIX) 40 MG tablet TAKE 1 TABLET BY MOUTH ONCE DAILY BEFORE BREAKFAST 08/30/21  Yes Mahon, Lenise Arena, NP  potassium chloride (KLOR-CON) 10 MEQ tablet Take 1 tablet by mouth once daily 08/06/21  Yes Derek Jack, MD  ruxolitinib phosphate (JAKAFI) 15 MG tablet Take 1 tablet (15 mg total) by mouth 2 (two) times daily. 10/07/21  Yes Derek Jack, MD  temazepam (RESTORIL) 30 MG capsule Take 1 capsule (30 mg total) by mouth at bedtime as needed for sleep. 03/09/21  Yes [name], MD  tiZANidine (ZANAFLEX) 4 MG tablet Take 2 tablets (8 mg total) by mouth 3 (three) times daily as needed (spasms). Patient taking differently: Take 4-8 mg by mouth 3 (three) times daily as needed (spasms). 09/24/21  Yes [name], MD  gabapentin (NEURONTIN) 300 MG capsule Take one capsule by mouth once daily, as needed, for pain 03/30/21   [name], MD  temazepam (RESTORIL) 30 MG capsule Take 1 capsule (30 mg total) by mouth at bedtime. Patient not taking: Reported on 10/19/2021 09/14/21   [name], MD  temazepam (RESTORIL) 30 MG capsule Take 1 capsule (30 mg total) by mouth at bedtime as needed for sleep. Patient not taking: Reported on 10/19/2021 10/14/21   [name], MD    Physical Exam: BP (!) 186/61 (BP Location: Left Arm)   Pulse 67   Temp (!) 97.2 F (36.2 C)   Resp 16   Ht '5\' 5"'$  (1.651 m)   Wt 60 kg   SpO2 94%   BMI 22.01 kg/m   General: 76 y.o. year-old female well developed well nourished in no acute distress.  Alert and oriented x3. HEENT: NCAT, EOMI, dry mucous membranes Neck: Supple, trachea medial Cardiovascular: Regular rate and rhythm with no rubs or  gallops.  No thyromegaly or JVD noted.  No lower extremity edema. 2/4 pulses in all 4 extremities. Respiratory: Clear to auscultation with no  wheezes or rales. Good inspiratory effort. Abdomen: Soft, nontender nondistended with normal bowel sounds x4 quadrants. Muskuloskeletal: No cyanosis, clubbing or edema noted bilaterally Neuro: CN II-XII intact, strength 5/5 x 4, sensation, reflexes intact Skin: No ulcerative lesions noted or rashes Psychiatry: Judgement and insight appear normal. Mood is appropriate for condition and setting          Labs on Admission:  Basic Metabolic Panel: Recent Labs  Lab 10/19/21 1540  NA 140  K 4.6  CL 109  CO2 26  GLUCOSE 89  BUN 38*  CREATININE 1.95*  CALCIUM 8.5*  MG 2.2   Liver Function Tests: Recent Labs  Lab 10/19/21 1540  AST 57*  ALT 34  ALKPHOS 184*  BILITOT 1.0  PROT 7.2  ALBUMIN 3.6   No results for input(s): "LIPASE", "AMYLASE" in the last 168 hours. No results for input(s): "AMMONIA" in the last 168 hours. CBC: Recent Labs  Lab 10/18/21 1404  WBC 115.7*  NEUTROABS 69.4*  HGB 6.7*  HCT 20.6*  MCV 94.9  PLT 108*   Cardiac Enzymes: No results for input(s): "CKTOTAL", "CKMB", "CKMBINDEX", "TROPONINI" in the last 168 hours.  BNP (last 3 results) Recent Labs    09/02/21 1622  BNP 569.0*    ProBNP (last 3 results) No results for input(s): "PROBNP" in the last 8760 hours.  CBG: No results for input(s): "GLUCAP" in the last 168 hours.  Radiological Exams on Admission: DG Chest 2 View  Result Date: 10/19/2021 CLINICAL DATA:  Cough, anemia, low blood pressure EXAM: CHEST - 2 VIEW COMPARISON:  09/02/2021 FINDINGS: RIGHT jugular Port-A-Cath with tip projecting over SVC. Enlargement of cardiac silhouette with pulmonary vascular congestion. Mediastinal contours normal. Minimal chronic interstitial prominence stable. No pulmonary infiltrate, pleural effusion, or pneumothorax. No osseous abnormalities. IMPRESSION:  Enlargement of cardiac silhouette with pulmonary vascular congestion. Minimal chronic interstitial prominence without acute infiltrate. Electronically Signed   By: Lavonia Dana M.D.   On: 10/19/2021 16:41    EKG: I independently viewed the EKG done and my findings are as followed: Sinus bradycardia at a rate of 42 bpm  Assessment/Plan Present on Admission:  Essential hypertension  GERD (gastroesophageal reflux disease)  MPN (myeloproliferative neoplasm) (HCC)  Leukocytosis  Principal Problem:   Generalized weakness Active Problems:   Essential hypertension   GERD (gastroesophageal reflux disease)   Leukocytosis   MPN (myeloproliferative neoplasm) (HCC)   Hypotension   Sinus bradycardia   Hypertensive urgency   UTI (urinary tract infection)   Transaminitis   Thrombocytopenia (HCC)  Generalized weakness secondary to multifactorial  Symptomatic anemia Hypotension and bradycardia Hemoglobin was 6.7 at the oncology clinic and 1 unit of PRBC was given Patient was hypotensive and bradycardic after blood transfusion per oncology RN medical record, she was reported to be lethargic and sleepy and she was sent to the ED for further evaluation and management. Patient was initially bradycardic in the ED, but these improved shortly after receiving IV hydration Continue fall precaution and neurochecks  Hypertensive urgency Essential hypertension (uncontrolled) Continue IV hydralazine 10 mg every 6 hours as needed Continue losartan  Presumed UTI POA Patient was started on IV ceftriaxone due to large leukocytes and greater than 50 WBC noted in urinalysis, she however, denies any irritative bladder symptoms.  Though, she endorsed that she just completed antibiotics for UTI 2 days ago. Continue IV ceftriaxone with plan to de-escalate/discontinue based on urine culture  Dehydration Acute kidney injury on CKD 3A/3B BUN/creatinine 30/1.95 (creatinine baseline at 1.1-1.3) Continue IV  hydration Renally adjust medications, avoid nephrotoxic agents/dehydration/hypotension  MPL positive myeloproliferative neoplasm Leukocytosis Thrombocytopenia Patient follows with [name] WBC 115.7, continue to monitor WBC with morning labs Platelets 108, continue to monitor platelet levels Continue allopurinol Ruxolitinib will be temporarily held at this time due to hold criteria for Jakafi (CrCl < 60 mL/min AND Pltc < 100K    - or- CrCl < 15 mL/min AND not on dialysis).   Transaminitis AST 57, ALP 184 Continue to monitor liver enzymes  GERD Continue Protonix   DVT prophylaxis: Lovenox  Code Status: Full code (confirmed with patient and daughter at bedside)  Consults: None  Family Communication: Daughter at bedside (all questions answered.)  Severity of Illness: The appropriate patient status for this patient is OBSERVATION. Observation status is judged to be reasonable and necessary in order to provide the required intensity of service to ensure the patient's safety. The patient's presenting symptoms, physical exam findings, and initial radiographic and laboratory data in the context of their medical condition is felt to place them at decreased risk for further clinical deterioration. Furthermore, it is anticipated that the patient will be medically stable for discharge from the hospital within 2 midnights of admission.   Author: Bernadette Hoit, DO 10/19/2021 9:50 PM  For on call review [email or web address].

## 2021-10-19 NOTE — ED Notes (Signed)
Pt assisted to bathroom and back to bed 

## 2021-10-19 NOTE — ED Triage Notes (Signed)
Pt seen in the cancer center today for blood transfusion for HGB of 6.7. pt states she was sent down to ED for fluids d/t low BP. Pts BP is 98/51. Pt states she just feels fatigued from low blood counts.

## 2021-10-19 NOTE — Progress Notes (Signed)
Patient presents today for one unit of PRBC.  Patient is very lethargic and sleepy today.  Tarri Abernethy, PA-C notified.  Vital signs are stable.  Labs reviewed.  Hemoglobin on 10/18/21 was 6.7.  Per Tarri Abernethy, PA-C we will continue with blood transfusion and re-evaluate patient post transfusion.  If patient is still very lethargic we will proceed with IVF.    Per Tarri Abernethy, PA-C we will hold Benadryl 25 mg d/t lethargy and sleepiness.  Patient did state that she had Zyrtec 10 mg this morning for itching.    Patient received one unit of PRBC.  Patient was slightly hypotensive and bradycardic post blood transfusion.  Patient's mental status remained the same.  She was still very lethargic and sleepy.  Patient stated that she was taking pain medication as prescribed and had not taken any other medications other than BP medication.  Tarri Abernethy, PA-C notified.  She suggested that patient be evaluated in the ED due to symptomatic bradycardia.  Patient agreed. Report was called to charge RN and patient was transported via wheelchair to the ED in stable condition.  Patient remains accessed per wishes of the charge RN.

## 2021-10-20 ENCOUNTER — Telehealth: Payer: Medicare Other | Admitting: Family Medicine

## 2021-10-20 ENCOUNTER — Other Ambulatory Visit (HOSPITAL_COMMUNITY): Payer: Self-pay | Admitting: *Deleted

## 2021-10-20 ENCOUNTER — Observation Stay (HOSPITAL_BASED_OUTPATIENT_CLINIC_OR_DEPARTMENT_OTHER): Payer: Medicare Other

## 2021-10-20 ENCOUNTER — Encounter (HOSPITAL_COMMUNITY): Payer: Self-pay | Admitting: Internal Medicine

## 2021-10-20 ENCOUNTER — Telehealth: Payer: Self-pay

## 2021-10-20 DIAGNOSIS — R011 Cardiac murmur, unspecified: Secondary | ICD-10-CM

## 2021-10-20 DIAGNOSIS — I160 Hypertensive urgency: Secondary | ICD-10-CM | POA: Diagnosis not present

## 2021-10-20 DIAGNOSIS — K219 Gastro-esophageal reflux disease without esophagitis: Secondary | ICD-10-CM | POA: Diagnosis not present

## 2021-10-20 DIAGNOSIS — R531 Weakness: Secondary | ICD-10-CM | POA: Diagnosis not present

## 2021-10-20 DIAGNOSIS — I10 Essential (primary) hypertension: Secondary | ICD-10-CM | POA: Diagnosis not present

## 2021-10-20 DIAGNOSIS — D696 Thrombocytopenia, unspecified: Secondary | ICD-10-CM | POA: Diagnosis not present

## 2021-10-20 DIAGNOSIS — R001 Bradycardia, unspecified: Secondary | ICD-10-CM | POA: Diagnosis not present

## 2021-10-20 DIAGNOSIS — D72829 Elevated white blood cell count, unspecified: Secondary | ICD-10-CM | POA: Diagnosis not present

## 2021-10-20 LAB — COMPREHENSIVE METABOLIC PANEL
ALT: 38 U/L (ref 0–44)
AST: 65 U/L — ABNORMAL HIGH (ref 15–41)
Albumin: 3.6 g/dL (ref 3.5–5.0)
Alkaline Phosphatase: 211 U/L — ABNORMAL HIGH (ref 38–126)
Anion gap: 5 (ref 5–15)
BUN: 36 mg/dL — ABNORMAL HIGH (ref 8–23)
CO2: 24 mmol/L (ref 22–32)
Calcium: 8.2 mg/dL — ABNORMAL LOW (ref 8.9–10.3)
Chloride: 111 mmol/L (ref 98–111)
Creatinine, Ser: 1.75 mg/dL — ABNORMAL HIGH (ref 0.44–1.00)
GFR, Estimated: 30 mL/min — ABNORMAL LOW (ref >60)
Glucose, Bld: 94 mg/dL (ref 70–99)
Potassium: 4.2 mmol/L (ref 3.5–5.1)
Sodium: 140 mmol/L (ref 135–145)
Total Bilirubin: 0.8 mg/dL (ref 0.3–1.2)
Total Protein: 7.2 g/dL (ref 6.5–8.1)

## 2021-10-20 LAB — CBC
HCT: 21.7 % — ABNORMAL LOW (ref 36.0–46.0)
Hemoglobin: 7.1 g/dL — ABNORMAL LOW (ref 12.0–15.0)
MCH: 29.8 pg (ref 26.0–34.0)
MCHC: 32.7 g/dL (ref 30.0–36.0)
MCV: 91.2 fL (ref 80.0–100.0)
Platelets: 102 10*3/uL — ABNORMAL LOW (ref 150–400)
RBC: 2.38 MIL/uL — ABNORMAL LOW (ref 3.87–5.11)
RDW: 22.2 % — ABNORMAL HIGH (ref 11.5–15.5)
WBC: 99.6 10*3/uL (ref 4.0–10.5)
nRBC: 3.2 % — ABNORMAL HIGH (ref 0.0–0.2)

## 2021-10-20 LAB — ECHOCARDIOGRAM COMPLETE
AR max vel: 1.61 cm2
AV Area VTI: 1.63 cm2
AV Area mean vel: 1.69 cm2
AV Mean grad: 12 mmHg
AV Peak grad: 25.6 mmHg
Ao pk vel: 2.53 m/s
Area-P 1/2: 2.29 cm2
Height: 65 in
P 1/2 time: 447 msec
S' Lateral: 3 cm
Weight: 2095.25 oz

## 2021-10-20 LAB — APTT: aPTT: 32 seconds (ref 24–36)

## 2021-10-20 MED ORDER — METOPROLOL TARTRATE 25 MG PO TABS: 25.0000 mg | ORAL_TABLET | Freq: Two times a day (BID) | ORAL | 1 refills | Status: DC

## 2021-10-20 MED ORDER — FOSFOMYCIN TROMETHAMINE 3 G PO PACK
3.0000 g | PACK | Freq: Once | ORAL | Status: AC
  Administered 2021-10-20: 3 g via ORAL
  Filled 2021-10-20: qty 3

## 2021-10-20 MED ORDER — SODIUM CHLORIDE 0.9 % IV SOLN: INTRAVENOUS | Status: DC

## 2021-10-20 MED ORDER — BENZONATATE 100 MG PO CAPS
200.0000 mg | ORAL_CAPSULE | Freq: Three times a day (TID) | ORAL | Status: DC | PRN
  Administered 2021-10-20: 200 mg via ORAL
  Filled 2021-10-20: qty 2

## 2021-10-20 MED ORDER — HYDRALAZINE HCL 25 MG PO TABS: 25.0000 mg | ORAL_TABLET | Freq: Three times a day (TID) | ORAL | 1 refills | Status: DC

## 2021-10-20 MED ORDER — HYDROCOD POLI-CHLORPHE POLI ER 10-8 MG/5ML PO SUER
5.0000 mL | Freq: Two times a day (BID) | ORAL | Status: DC | PRN
  Administered 2021-10-20 (×2): 5 mL via ORAL
  Filled 2021-10-20 (×2): qty 5

## 2021-10-20 MED ORDER — CITALOPRAM HYDROBROMIDE 20 MG PO TABS
20.0000 mg | ORAL_TABLET | Freq: Every day | ORAL | Status: DC
  Administered 2021-10-20: 20 mg via ORAL
  Filled 2021-10-20: qty 1

## 2021-10-20 MED ORDER — CHLORHEXIDINE GLUCONATE CLOTH 2 % EX PADS
6.0000 | MEDICATED_PAD | Freq: Every day | CUTANEOUS | Status: DC
  Administered 2021-10-20: 6 via TOPICAL

## 2021-10-20 MED ORDER — MIRTAZAPINE 15 MG PO TABS: 15.0000 mg | ORAL_TABLET | Freq: Every day | ORAL | Status: DC

## 2021-10-20 MED ORDER — ACETAMINOPHEN 325 MG PO TABS
650.0000 mg | ORAL_TABLET | Freq: Four times a day (QID) | ORAL | Status: DC | PRN
  Administered 2021-10-20 (×2): 650 mg via ORAL
  Filled 2021-10-20 (×2): qty 2

## 2021-10-20 NOTE — Discharge Summary (Addendum)
Physician Discharge Summary  Renee Waters EUM:[id number] DOB: 04/13/1945 DOA: 10/19/2021  PCP: [name], MD Oncologist: Delton Coombes  Admit date: 10/19/2021 Discharge date: 10/20/2021  Admitted From:  Home  Disposition: Home   Recommendations for Outpatient Follow-up:  Follow up with PCP in 2 weeks Follow up with oncologist in 1 week  Please follow up: final 2D echocardiogram report (still pending at DC) Please follow up BPs and adjust meds/treatment as needed on outpatient basis  Discharge Condition: STABLE   CODE STATUS: FULL  DIET: resume prior home diet    Brief Hospitalization Summary: Please see all hospital notes, images, labs for full details of the hospitalization. ADMISSION HPI:  76 y.o. female with medical history significant of hypertension, MPL positive myeloproliferative neoplasm, CKD, GERD, SLE, chronic anemia with prior transfusion who was sent from oncology clinic today due to generalized weakness.  Patient complained of generalized weakness over the weekend, so she presented to the oncology clinic today, blood work showed hemoglobin of 6.7 and 1 unit of PRBC was provided, however, patient continued to be weak and she was sent to the ED for further evaluation and management.  Patient endorsed that she just completed antibiotics for UTI about 2 days ago.  She denies chest pain, shortness of breath, fever, chills, headache, abdominal pain, nausea or vomiting   ED Course:  In the emergency department, she was intermittently bradycardic with HR as low as 48 bpm, BP was 137/74 on arrival to the ED, other vital signs were within normal range.  Work-up in the ED showed WBC 115.7, H/H 6.7/20.6, platelets 108.  BMP was normal except for BUN/creatinine 30/1.95 (creatinine baseline at 1.1-1.3), AST 57, ALT 34, ALP 184.  Urinalysis was positive for large leukocytes and greater than 50 WBCs.  SARS coronavirus 2 was negative. Chest x-ray showed enlargement of cardiac silhouette  with pulmonary vascular congestion. Minimal chronic interstitial prominence without acute infiltrate.  She was treated with IV ceftriaxone, hospitalist was asked to admit patient for further evaluation and management.  HOSPITAL COURSE BY PROBLEM   Generalized weakness secondary to multifactorial reasons Likely due to Hypotension and bradycardia, from dehydration, treated with IV fluids Hemoglobin was 6.7 at the oncology clinic and 1 unit of PRBC was given 9/12. Hg>7 now.  Pt has close outpatient follow up with hematology/oncology clinic.  Patient was hypotensive and bradycardic after blood transfusion per oncology RN medical record, she was reported to be lethargic and sleepy and she was sent to the ED for further evaluation and management. Patient was initially bradycardic in the ED, but these improved shortly after receiving IV hydration Continue fall precaution and neurochecks Reduced metoprolol from 37.5 mg BID to 25 mg BID due to bradycardia, lethargy, weakness   Hypertensive urgency Essential hypertension (suboptimally controlled) Continue losartan, metoprolol BPs are improving now Added hydralazine 25 mg TID  Presumed UTI POA Patient was started on IV ceftriaxone due to large leukocytes and greater than 50 WBC noted in urinalysis, she however, denies any irritative bladder symptoms.  Though, she endorsed that she just completed antibiotics for UTI 2 days ago. Continue IV ceftriaxone with plan to de-escalate/discontinue based on urine culture Pt says she just wants to go home today.   Cultures are not resulted.  Pt given 1 dose of fosfomycin to complete treatment and can DC home today.    Dehydration Acute kidney injury on CKD 3A/3B BUN/creatinine 30/1.95 (creatinine baseline at 1.1-1.3) Continue IV hydration Renally adjust medications, avoid nephrotoxic agents/dehydration/hypotension   MPL positive  myeloproliferative neoplasm Leukocytosis Thrombocytopenia Patient follows  with [name] WBC 115.7, continue to monitor WBC with morning labs Platelets 108, continue to monitor platelet levels Continue allopurinol Ruxolitinib will be temporarily held at this time due to hold criteria for Jakafi (CrCl < 60 mL/min AND Pltc < 100K    - or- CrCl < 15 mL/min AND not on dialysis).    Transaminitis AST 57, ALP 184 Continue to monitor liver enzymes   GERD Continue Protonix  Heart Murmur Pt had a TEE in 2016 that I reviewed and she does have some valvular disease noted during study Pt insists on having TTE while inpatient Follow up 2 D Echocardiogram with outpatient providers as results still pending at discharge    Discharge Diagnoses:  Principal Problem:   Generalized weakness Active Problems:   Essential hypertension   GERD (gastroesophageal reflux disease)   Leukocytosis   MPN (myeloproliferative neoplasm) (HCC)   Hypotension   Sinus bradycardia   Hypertensive urgency   UTI (urinary tract infection)   Transaminitis   Thrombocytopenia (HCC)   Discharge Instructions:  Allergies as of 10/20/2021       Reactions   Bee Venom Swelling, Hives   Acetaminophen    itches   Amlodipine    Hair loss   Red Dye Other (See Comments)   Tyloxapol    Other Itching, Rash, Swelling   Oxycodone-acetaminophen Rash   Pt states, "this gives her a rash, but at home she takes oxycodone for pain relief" Pt states, "this gives her a rash, but at home she takes oxycodone for pain relief"        Medication List     STOP taking these medications    meloxicam 7.5 MG tablet Commonly known as: MOBIC       TAKE these medications    acyclovir 800 MG tablet Commonly known as: ZOVIRAX Take 1 tablet (800 mg total) by mouth 5 (five) times daily.   acyclovir ointment 5 % Commonly known as: ZOVIRAX Apply 1 application. topically every 3 (three) hours.   allopurinol 300 MG tablet Commonly known as: ZYLOPRIM Take 1 tablet (300 mg total) by mouth daily.    citalopram 20 MG tablet Commonly known as: CELEXA Take 1 tablet by mouth once daily   EPINEPHrine 0.3 mg/0.3 mL Soaj injection Commonly known as: EPI-PEN INJECT 0.3 MLS INTO MUSCLE ONCE AS NEEDED What changed: See the new instructions.   furosemide 20 MG tablet Commonly known as: LASIX Take 1 tablet (20 mg total) by mouth daily as needed.   gabapentin 300 MG capsule Commonly known as: NEURONTIN Take one capsule by mouth once daily, as needed, for pain   hydrALAZINE 25 MG tablet Commonly known as: APRESOLINE Take 1 tablet (25 mg total) by mouth 3 (three) times daily.   lidocaine-prilocaine cream Commonly known as: EMLA Apply 1 application. topically as needed.   losartan 50 MG tablet Commonly known as: COZAAR Take 1 tablet (50 mg total) by mouth daily.   metoprolol tartrate 25 MG tablet Commonly known as: LOPRESSOR Take 1 tablet (25 mg total) by mouth 2 (two) times daily. Start taking on: October 21, 2021 What changed:  how much to take how to take this when to take this additional instructions   mirtazapine 15 MG tablet Commonly known as: REMERON Take 1 tablet (15 mg total) by mouth at bedtime.   oxyCODONE 5 MG immediate release tablet Commonly known as: Oxy IR/ROXICODONE Take 5 mg by mouth every 4 (four) hours as  needed.   pantoprazole 40 MG tablet Commonly known as: PROTONIX TAKE 1 TABLET BY MOUTH ONCE DAILY BEFORE BREAKFAST   potassium chloride 10 MEQ tablet Commonly known as: KLOR-CON Take 1 tablet by mouth once daily   ruxolitinib phosphate 15 MG tablet Commonly known as: Jakafi Take 1 tablet (15 mg total) by mouth 2 (two) times daily.   temazepam 30 MG capsule Commonly known as: Restoril Take 1 capsule (30 mg total) by mouth at bedtime as needed for sleep.   tiZANidine 4 MG tablet Commonly known as: ZANAFLEX Take 2 tablets (8 mg total) by mouth 3 (three) times daily as needed (spasms). What changed: how much to take        Follow-up  Information     [name], MD. Schedule an appointment as soon as possible for a visit in 2 day(s).   Specialty: Family Medicine Why: Hospital Follow Up Contact information: [address], [place] [address] [phone]         Derek Jack, MD. Schedule an appointment as soon as possible for a visit in 1 week(s).   Specialty: Hematology Why: Hospital Follow Up Contact information: [place] [address] [phone]                Allergies  Allergen Reactions   Bee Venom Swelling and Hives   Acetaminophen     itches   Amlodipine     Hair loss   Red Dye Other (See Comments)   Tyloxapol    Other Itching, Rash and Swelling   Oxycodone-Acetaminophen Rash    Pt states, "this gives her a rash, but at home she takes oxycodone for pain relief" Pt states, "this gives her a rash, but at home she takes oxycodone for pain relief"   Allergies as of 10/20/2021       Reactions   Bee Venom Swelling, Hives   Acetaminophen    itches   Amlodipine    Hair loss   Red Dye Other (See Comments)   Tyloxapol    Other Itching, Rash, Swelling   Oxycodone-acetaminophen Rash   Pt states, "this gives her a rash, but at home she takes oxycodone for pain relief" Pt states, "this gives her a rash, but at home she takes oxycodone for pain relief"        Medication List     STOP taking these medications    meloxicam 7.5 MG tablet Commonly known as: MOBIC       TAKE these medications    acyclovir 800 MG tablet Commonly known as: ZOVIRAX Take 1 tablet (800 mg total) by mouth 5 (five) times daily.   acyclovir ointment 5 % Commonly known as: ZOVIRAX Apply 1 application. topically every 3 (three) hours.   allopurinol 300 MG tablet Commonly known as: ZYLOPRIM Take 1 tablet (300 mg total) by mouth daily.   citalopram 20 MG tablet Commonly known as: CELEXA Take 1 tablet by mouth once daily   EPINEPHrine 0.3 mg/0.3 mL Soaj  injection Commonly known as: EPI-PEN INJECT 0.3 MLS INTO MUSCLE ONCE AS NEEDED What changed: See the new instructions.   furosemide 20 MG tablet Commonly known as: LASIX Take 1 tablet (20 mg total) by mouth daily as needed.   gabapentin 300 MG capsule Commonly known as: NEURONTIN Take one capsule by mouth once daily, as needed, for pain   hydrALAZINE 25 MG tablet Commonly known as: APRESOLINE Take 1 tablet (25 mg total) by mouth  3 (three) times daily.   lidocaine-prilocaine cream Commonly known as: EMLA Apply 1 application. topically as needed.   losartan 50 MG tablet Commonly known as: COZAAR Take 1 tablet (50 mg total) by mouth daily.   metoprolol tartrate 25 MG tablet Commonly known as: LOPRESSOR Take 1 tablet (25 mg total) by mouth 2 (two) times daily. Start taking on: October 21, 2021 What changed:  how much to take how to take this when to take this additional instructions   mirtazapine 15 MG tablet Commonly known as: REMERON Take 1 tablet (15 mg total) by mouth at bedtime.   oxyCODONE 5 MG immediate release tablet Commonly known as: Oxy IR/ROXICODONE Take 5 mg by mouth every 4 (four) hours as needed.   pantoprazole 40 MG tablet Commonly known as: PROTONIX TAKE 1 TABLET BY MOUTH ONCE DAILY BEFORE BREAKFAST   potassium chloride 10 MEQ tablet Commonly known as: KLOR-CON Take 1 tablet by mouth once daily   ruxolitinib phosphate 15 MG tablet Commonly known as: Jakafi Take 1 tablet (15 mg total) by mouth 2 (two) times daily.   temazepam 30 MG capsule Commonly known as: Restoril Take 1 capsule (30 mg total) by mouth at bedtime as needed for sleep.   tiZANidine 4 MG tablet Commonly known as: ZANAFLEX Take 2 tablets (8 mg total) by mouth 3 (three) times daily as needed (spasms). What changed: how much to take        Procedures/Studies: DG Chest 2 View  Result Date: 10/19/2021 CLINICAL DATA:  Cough, anemia, low blood pressure EXAM: CHEST - 2 VIEW  COMPARISON:  09/02/2021 FINDINGS: RIGHT jugular Port-A-Cath with tip projecting over SVC. Enlargement of cardiac silhouette with pulmonary vascular congestion. Mediastinal contours normal. Minimal chronic interstitial prominence stable. No pulmonary infiltrate, pleural effusion, or pneumothorax. No osseous abnormalities. IMPRESSION: Enlargement of cardiac silhouette with pulmonary vascular congestion. Minimal chronic interstitial prominence without acute infiltrate. Electronically Signed   By: Lavonia Dana M.D.   On: 10/19/2021 16:41   US SPLEEN (ABDOMEN LIMITED)  Result Date: 09/21/2021 CLINICAL DATA:  Evaluate for splenomegaly. EXAM: ULTRASOUND ABDOMEN LIMITED COMPARISON:  CT abdomen pelvis 08/25/2021 FINDINGS: Targeted ultrasound of the spleen is performed. The spleen is again mildly enlarged measuring 13.6 x 6.6 x 11.3 cm. Splenic volume measures 532 mL. IMPRESSION: Mild splenomegaly. Electronically Signed   By: Audie Pinto M.D.   On: 09/21/2021 15:55     Subjective: Pt says she wants to go home.  She denies any specific complaints.  No dysuria.  No CP.  No palpitations. No SOB.   Discharge Exam: Vitals:   10/20/21 0627 10/20/21 0944  BP: (!) 165/72 (!) 176/83  Pulse: 75 78  Resp: 18   Temp: (!) 100.8 F (38.2 C) 99.9 F (37.7 C)  SpO2: 92% 95%   Vitals:   10/19/21 2112 10/19/21 2129 10/20/21 0627 10/20/21 0944  BP: (!) 175/83 (!) 186/61 (!) 165/72 (!) 176/83  Pulse: 61 67 75 78  Resp: '13 16 18   '$ Temp:  (!) 97.2 F (36.2 C) (!) 100.8 F (38.2 C) 99.9 F (37.7 C)  TempSrc:   Oral Oral  SpO2: 95% 94% 92% 95%  Weight:  59.4 kg    Height:       General: Pt is alert, awake, not in acute distress Cardiovascular: normal S1/S2 +, no rubs, no gallops soft systolic murmur heard. Respiratory: CTA bilaterally, no wheezing, no rhonchi Abdominal: Soft, NT, ND, bowel sounds + Extremities: no edema, no cyanosis   The  results of significant diagnostics from this hospitalization  (including imaging, microbiology, ancillary and laboratory) are listed below for reference.     Microbiology: Recent Results (from the past 240 hour(s))  SARS Coronavirus 2 by RT PCR (hospital order, performed in [facility] hospital lab) *cepheid single result test* Anterior Nasal Swab     Status: None   Collection Time: 10/19/21  4:49 PM   Specimen: Anterior Nasal Swab  Result Value Ref Range Status   SARS Coronavirus 2 by RT PCR NEGATIVE NEGATIVE Final    Comment: (NOTE) SARS-CoV-2 target nucleic acids are NOT DETECTED.  The SARS-CoV-2 RNA is generally detectable in upper and lower respiratory specimens during the acute phase of infection. The lowest concentration of SARS-CoV-2 viral copies this assay can detect is 250 copies / mL. A negative result does not preclude SARS-CoV-2 infection and should not be used as the sole basis for treatment or other patient management decisions.  A negative result may occur with improper specimen collection / handling, submission of specimen other than nasopharyngeal swab, presence of viral mutation(s) within the areas targeted by this assay, and inadequate number of viral copies (<250 copies / mL). A negative result must be combined with clinical observations, patient history, and epidemiological information.  Fact Sheet for Patients:   https://www.patel.info/  Fact Sheet for Healthcare Providers: https://hall.com/  This test is not yet approved or  cleared by the [place] FDA and has been authorized for detection and/or diagnosis of SARS-CoV-2 by FDA under an Emergency Use Authorization (EUA).  This EUA will remain in effect (meaning this test can be used) for the duration of the COVID-19 declaration under Section 564(b)(1) of the Act, 21 U.S.C. section 360bbb-3(b)(1), unless the authorization is terminated or revoked sooner.  Performed at [facility], [address] [place],   [address]      Labs: BNP (last 3 results) Recent Labs    09/02/21 1622  BNP 191.4*   Basic Metabolic Panel: Recent Labs  Lab 10/19/21 1540 10/20/21 0706  NA 140 140  K 4.6 4.2  CL 109 111  CO2 26 24  GLUCOSE 89 94  BUN 38* 36*  CREATININE 1.95* 1.75*  CALCIUM 8.5* 8.2*  MG 2.2  --    Liver Function Tests: Recent Labs  Lab 10/19/21 1540 10/20/21 0706  AST 57* 65*  ALT 34 38  ALKPHOS 184* 211*  BILITOT 1.0 0.8  PROT 7.2 7.2  ALBUMIN 3.6 3.6   No results for input(s): "LIPASE", "AMYLASE" in the last 168 hours. No results for input(s): "AMMONIA" in the last 168 hours. CBC: Recent Labs  Lab 10/18/21 1404 10/20/21 0706  WBC 115.7* 99.6*  NEUTROABS 69.4*  --   HGB 6.7* 7.1*  HCT 20.6* 21.7*  MCV 94.9 91.2  PLT 108* 102*   Cardiac Enzymes: No results for input(s): "CKTOTAL", "CKMB", "CKMBINDEX", "TROPONINI" in the last 168 hours. BNP: Invalid input(s): "POCBNP" CBG: No results for input(s): "GLUCAP" in the last 168 hours. D-Dimer No results for input(s): "DDIMER" in the last 72 hours. Hgb A1c No results for input(s): "HGBA1C" in the last 72 hours. Lipid Profile No results for input(s): "CHOL", "HDL", "LDLCALC", "TRIG", "CHOLHDL", "LDLDIRECT" in the last 72 hours. Thyroid function studies No results for input(s): "TSH", "T4TOTAL", "T3FREE", "THYROIDAB" in the last 72 hours.  Invalid input(s): "FREET3" Anemia work up No results for input(s): "VITAMINB12", "FOLATE", "FERRITIN", "TIBC", "IRON", "RETICCTPCT" in the last 72 hours. Urinalysis    Component Value Date/Time   COLORURINE  YELLOW 10/19/2021 1828   APPEARANCEUR HAZY (A) 10/19/2021 1828   LABSPEC 1.010 10/19/2021 1828   PHURINE 5.0 10/19/2021 1828   GLUCOSEU NEGATIVE 10/19/2021 1828   GLUCOSEU NEG mg/dL 05/26/2009 2225   HGBUR NEGATIVE 10/19/2021 1828   HGBUR negative 08/19/2008 1124   BILIRUBINUR NEGATIVE 10/19/2021 1828   BILIRUBINUR negative 10/08/2021 1134   BILIRUBINUR neg 08/15/2017 0955    KETONESUR NEGATIVE 10/19/2021 1828   PROTEINUR NEGATIVE 10/19/2021 1828   UROBILINOGEN 0.2 10/08/2021 1134   UROBILINOGEN 0.2 12/05/2012 1150   NITRITE NEGATIVE 10/19/2021 1828   LEUKOCYTESUR LARGE (A) 10/19/2021 1828   Sepsis Labs Recent Labs  Lab 10/18/21 1404 10/20/21 0706  WBC 115.7* 99.6*   Microbiology Recent Results (from the past 240 hour(s))  SARS Coronavirus 2 by RT PCR (hospital order, performed in [facility] hospital lab) *cepheid single result test* Anterior Nasal Swab     Status: None   Collection Time: 10/19/21  4:49 PM   Specimen: Anterior Nasal Swab  Result Value Ref Range Status   SARS Coronavirus 2 by RT PCR NEGATIVE NEGATIVE Final    Comment: (NOTE) SARS-CoV-2 target nucleic acids are NOT DETECTED.  The SARS-CoV-2 RNA is generally detectable in upper and lower respiratory specimens during the acute phase of infection. The lowest concentration of SARS-CoV-2 viral copies this assay can detect is 250 copies / mL. A negative result does not preclude SARS-CoV-2 infection and should not be used as the sole basis for treatment or other patient management decisions.  A negative result may occur with improper specimen collection / handling, submission of specimen other than nasopharyngeal swab, presence of viral mutation(s) within the areas targeted by this assay, and inadequate number of viral copies (<250 copies / mL). A negative result must be combined with clinical observations, patient history, and epidemiological information.  Fact Sheet for Patients:   https://www.patel.info/  Fact Sheet for Healthcare Providers: https://hall.com/  This test is not yet approved or  cleared by the [place] FDA and has been authorized for detection and/or diagnosis of SARS-CoV-2 by FDA under an Emergency Use Authorization (EUA).  This EUA will remain in effect (meaning this test can be used) for the duration of  the COVID-19 declaration under Section 564(b)(1) of the Act, 21 U.S.C. section 360bbb-3(b)(1), unless the authorization is terminated or revoked sooner.  Performed at [facility], [address], [place], [place] [address]     Time coordinating discharge: 36 mins   SIGNED:  Irwin Brakeman, MD  [facility] 10/20/2021, 12:35 PM How to contact the [facility] Attending or Consulting provider [place] or covering provider during after hours [place], for this patient?  Check the care team in [facility] and look for a) attending/consulting [facility] provider listed and b) the [facility] team listed Log into [email or web address] and use [place] universal password to access. If you do not have the password, please contact the hospital operator. Locate the [facility] provider you are looking for under [facility] and page to a number that you can be directly reached. If you still have difficulty reaching the provider, please page the [facility] (Director on Call) for the Hospitalists listed on amion for assistance.

## 2021-10-20 NOTE — Discharge Instructions (Signed)
IMPORTANT INFORMATION: PAY CLOSE ATTENTION   PHYSICIAN DISCHARGE INSTRUCTIONS  Follow with Primary care provider  Simpson, Margaret E, MD  and other consultants as instructed by your Hospitalist Physician  SEEK MEDICAL CARE OR RETURN TO EMERGENCY ROOM IF SYMPTOMS COME BACK, WORSEN OR NEW PROBLEM DEVELOPS   Please note: You were cared for by a hospitalist during your hospital stay. Every effort will be made to forward records to your primary care provider.  You can request that your primary care provider send for your hospital records if they have not received them.  Once you are discharged, your primary care physician will handle any further medical issues. Please note that NO REFILLS for any discharge medications will be authorized once you are discharged, as it is imperative that you return to your primary care physician (or establish a relationship with a primary care physician if you do not have one) for your post hospital discharge needs so that they can reassess your need for medications and monitor your lab values.  Please get a complete blood count and chemistry panel checked by your Primary MD at your next visit, and again as instructed by your Primary MD.  Get Medicines reviewed and adjusted: Please take all your medications with you for your next visit with your Primary MD  Laboratory/radiological data: Please request your Primary MD to go over all hospital tests and procedure/radiological results at the follow up, please ask your primary care provider to get all Hospital records sent to his/her office.  In some cases, they will be blood work, cultures and biopsy results pending at the time of your discharge. Please request that your primary care provider follow up on these results.  If you are diabetic, please bring your blood sugar readings with you to your follow up appointment with primary care.    Please call and make your follow up appointments as soon as possible.    Also  Note the following: If you experience worsening of your admission symptoms, develop shortness of breath, life threatening emergency, suicidal or homicidal thoughts you must seek medical attention immediately by calling 911 or calling your MD immediately  if symptoms less severe.  You must read complete instructions/literature along with all the possible adverse reactions/side effects for all the Medicines you take and that have been prescribed to you. Take any new Medicines after you have completely understood and accpet all the possible adverse reactions/side effects.   Do not drive when taking Pain medications or sleeping medications (Benzodiazepines)  Do not take more than prescribed Pain, Sleep and Anxiety Medications. It is not advisable to combine anxiety,sleep and pain medications without talking with your primary care practitioner  Special Instructions: If you have smoked or chewed Tobacco  in the last 2 yrs please stop smoking, stop any regular Alcohol  and or any Recreational drug use.  Wear Seat belts while driving.  Do not drive if taking any narcotic, mind altering or controlled substances or recreational drugs or alcohol.       

## 2021-10-20 NOTE — Evaluation (Signed)
Physical Therapy Evaluation Patient Details Name: Renee Waters MRN: [id number] DOB: 08/06/1945 Today's Date: 10/20/2021  History of Present Illness  Renee Waters is a 76 y.o. female with medical history significant of hypertension, MPL positive myeloproliferative neoplasm, CKD, GERD, SLE, chronic anemia with prior transfusion who was sent from oncology clinic today due to generalized weakness.  Patient complained of generalized weakness over the weekend, so she presented to the oncology clinic today, blood work showed hemoglobin of 6.7 and 1 unit of PRBC was provided, however, patient continued to be weak and she was sent to the ED for further evaluation and management.  Patient endorsed that she just completed antibiotics for UTI about 2 days ago.  She denies chest pain, shortness of breath, fever, chills, headache, abdominal pain, nausea or vomiting   Clinical Impression  Patient functioning near baseline for functional mobility and gait demonstrating good return for bed mobility, transfers, and ambulation in room/hallways without loss of balance.  Plan:  Patient discharged from physical therapy to care of nursing for ambulation daily as tolerated for length of stay.         Recommendations for follow up therapy are one component of a multi-disciplinary discharge planning process, led by the attending physician.  Recommendations may be updated based on patient status, additional functional criteria and insurance authorization.  Follow Up Recommendations No PT follow up      Assistance Recommended at Discharge PRN  Patient can return home with the following  Other (comment) (near baseline)    Equipment Recommendations None recommended by PT  Recommendations for Other Services       Functional Status Assessment Patient has not had a recent decline in their functional status     Precautions / Restrictions Precautions Precautions: None Restrictions Weight Bearing Restrictions: No       Mobility  Bed Mobility Overal bed mobility: Modified Independent                  Transfers Overall transfer level: Modified independent                      Ambulation/Gait Ambulation/Gait assistance: Modified independent (Device/Increase time) Gait Distance (Feet): 150 Feet Assistive device: Straight cane Gait Pattern/deviations: Step-through pattern, Decreased step length - left, Decreased stance time - right, Decreased stride length Gait velocity: decreased     General Gait Details: demonstrates good return for ambulating in room/hallways without loss of balance using SPC with mostly step-through pattern, only c/o minor chronic right hip pain  Stairs            Wheelchair Mobility    Modified Rankin (Stroke Patients Only)       Balance Overall balance assessment: Needs assistance Sitting-balance support: Feet supported, No upper extremity supported Sitting balance-Leahy Scale: Good Sitting balance - Comments: seated at EOB   Standing balance support: During functional activity, Single extremity supported Standing balance-Leahy Scale: Good Standing balance comment: using SPC                             Pertinent Vitals/Pain Pain Assessment Pain Assessment: 0-10 Pain Score: 8  Pain Location: right hip, spine Pain Descriptors / Indicators: Sore, Aching Pain Intervention(s): Limited activity within patient's tolerance, Monitored during session, Repositioned    Home Living Family/patient expects to be discharged to:: Private residence Living Arrangements: Spouse/significant other Available Help at Discharge: Family;Available 24 hours/day Type of Home: House Home Access: Stairs to  enter   Entrance Stairs-Number of Steps: 5   Home Layout: Two level;Able to live on main level with bedroom/bathroom Home Equipment: Rollator (4 wheels);Grab bars - tub/shower;BSC/3in1;Cane - single point      Prior Function Prior Level of  Function : Independent/Modified Independent             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, occasionally drives ADLs Comments: Independent with ADLs     Hand Dominance   Dominant Hand: Right    Extremity/Trunk Assessment   Upper Extremity Assessment Upper Extremity Assessment: Overall WFL for tasks assessed    Lower Extremity Assessment Lower Extremity Assessment: Overall WFL for tasks assessed    Cervical / Trunk Assessment Cervical / Trunk Assessment: Normal  Communication   Communication: No difficulties  Cognition Arousal/Alertness: Awake/alert Behavior During Therapy: WFL for tasks assessed/performed Overall Cognitive Status: Within Functional Limits for tasks assessed                                          General Comments      Exercises     Assessment/Plan    PT Assessment Patient does not need any further PT services  PT Problem List         PT Treatment Interventions      PT Goals (Current goals can be found in the Care Plan section)  Acute Rehab PT Goals Patient Stated Goal: return home with family to assist PT Goal Formulation: With patient Time For Goal Achievement: 10/20/21 Potential to Achieve Goals: Good    Frequency       Co-evaluation               AM-PAC PT "6 Clicks" Mobility  Outcome Measure Help needed turning from your back to your side while in a flat bed without using bedrails?: None Help needed moving from lying on your back to sitting on the side of a flat bed without using bedrails?: None Help needed moving to and from a bed to a chair (including a wheelchair)?: None Help needed standing up from a chair using your arms (e.g., wheelchair or bedside chair)?: None Help needed to walk in hospital room?: None Help needed climbing 3-5 steps with a railing? : None 6 Click Score: 24    End of Session   Activity Tolerance: Patient tolerated treatment well Patient left: in  chair;with call bell/phone within reach Nurse Communication: Mobility status PT Visit Diagnosis: Unsteadiness on feet (R26.81);Other abnormalities of gait and mobility (R26.89);Muscle weakness (generalized) (M62.81)    Time: 7824-2353 PT Time Calculation (min) (ACUTE ONLY): 22 min   Charges:   PT Evaluation $PT Eval Moderate Complexity: 1 Mod PT Treatments $Therapeutic Activity: 8-22 mins        11:46 AM, 10/20/21 Lonell Grandchild, MPT Physical Therapist with [facility] 336 [phone] office [phone] mobile phone

## 2021-10-20 NOTE — TOC Progression Note (Signed)
  Transition of Care [facility]) Screening Note   Patient Details  Name: Renee Waters Date of Birth: November 24, 1945   Transition of Care [facility]) CM/SW Contact:    Shade Flood, LCSW Phone Number: 10/20/2021, 11:31 AM    Transition of Care Department [facility]) has reviewed patient and no TOC needs have been identified at this time. We will continue to monitor patient advancement through interdisciplinary progression rounds. If new patient transition needs arise, please place a TOC consult.

## 2021-10-20 NOTE — Progress Notes (Addendum)
Date and time results received: 10/20/21 0755  Test: WBC Critical Value: 99.9  Name of Provider Notified: Wynetta Emery  Orders Received? Or Actions Taken?: awaiting response

## 2021-10-20 NOTE — Progress Notes (Signed)
*  PRELIMINARY RESULTS* Echocardiogram 2D Echocardiogram has been performed.  Renee Waters 10/20/2021, 5:23 PM

## 2021-10-20 NOTE — Telephone Encounter (Signed)
Husband Renee Waters called and said patient was admitted to [facility] room 337 with low blood pressure. Please return patient spouse call at [phone] is Pat #.

## 2021-10-21 ENCOUNTER — Encounter: Payer: Self-pay | Admitting: Family Medicine

## 2021-10-21 ENCOUNTER — Ambulatory Visit (INDEPENDENT_AMBULATORY_CARE_PROVIDER_SITE_OTHER): Payer: Medicare Other | Admitting: Family Medicine

## 2021-10-21 DIAGNOSIS — N390 Urinary tract infection, site not specified: Secondary | ICD-10-CM | POA: Diagnosis not present

## 2021-10-21 DIAGNOSIS — R0989 Other specified symptoms and signs involving the circulatory and respiratory systems: Secondary | ICD-10-CM | POA: Diagnosis not present

## 2021-10-21 DIAGNOSIS — I5189 Other ill-defined heart diseases: Secondary | ICD-10-CM | POA: Diagnosis not present

## 2021-10-21 DIAGNOSIS — D471 Chronic myeloproliferative disease: Secondary | ICD-10-CM | POA: Diagnosis not present

## 2021-10-21 DIAGNOSIS — R053 Chronic cough: Secondary | ICD-10-CM

## 2021-10-21 DIAGNOSIS — I499 Cardiac arrhythmia, unspecified: Secondary | ICD-10-CM | POA: Diagnosis not present

## 2021-10-21 NOTE — Progress Notes (Unsigned)
[name], [name] [address]   CLINIC:  Medical Oncology/Hematology  PCP:  [name], MD [address] [facility] / [place] [address] [phone]   REASON FOR VISIT:  Follow-up for MPL positive myeloproliferative neoplasm  PRIOR THERAPY: none  NGS Results: not done  CURRENT THERAPY: Jakafi 10 mg twice daily  INTERVAL HISTORY:  Renee Waters, a 76 y.o. female, seen for follow-up of MPL positive myeloproliferative neoplasm.  She reports energy levels of 80%.  Chronic pain, leg and hip pains have been stable.  Reports occasional shortness of breath on exertion.  Reports that diarrhea has completely normalized.  She was started on blood pressure medication metoprolol on 09/09/2021 and dose has increased to 1 and half tablet twice daily around 09/20/2021.  She has reported itching all over from the inside out since the dose was increased.  She also increased ruxolitinib dose to twice daily at that time.  She has previously taken ruxolitinib twice daily without any major problems.  She is taking Zyrtec which is helping.  REVIEW OF SYSTEMS:  Review of Systems  Constitutional:  Negative for appetite change, fatigue (improved) and fever.  Respiratory:  Positive for shortness of breath. Negative for cough.   Cardiovascular:  Negative for leg swelling.  Gastrointestinal:  Negative for diarrhea.  Musculoskeletal:  Positive for arthralgias (Hip, back and legs stable).  All other systems reviewed and are negative.   PAST MEDICAL/SURGICAL HISTORY:  Past Medical History:  Diagnosis Date   Acute cholangitis    Allergy    Anemia    Anxiety    Arthritis    Phreesia 03/18/2020   Barrett's esophagus    Cataract    Chronic back pain    Chronic neck pain    CKD (chronic kidney disease) stage 3, GFR 30-59 ml/min ([place]) 10/29/2020   Depression    Genital herpes    GERD (gastroesophageal reflux disease)    H/O degenerative disc disease     History of blood transfusion    Hypertension    Insomnia    Lupus (systemic lupus erythematosus) ([place])    Neuromuscular disorder ([place])    Osteoarthritis    S/P colonoscopy June 2005   normal, no polyps   S/P endoscopy June 2005, Oct 2009   2005: short-segment Barrett's, 2009: short-segment Barrett's   Upper respiratory tract infection 07/09/2020   UTI (lower urinary tract infection) 11/2012   Past Surgical History:  Procedure Laterality Date   ABDOMINAL HYSTERECTOMY     BACK SURGERY     BIOPSY N/A 03/20/2014   Procedure: BIOPSY;  Surgeon: Daneil Dolin, MD;  Location: [facility];  Service: Endoscopy;  Laterality: N/A;   BIOPSY  09/14/2015   Procedure: BIOPSY;  Surgeon: Daneil Dolin, MD;  Location: [facility];  Service: Endoscopy;;  esophageal and gastric   BIOPSY  07/23/2019   Procedure: BIOPSY;  Surgeon: Daneil Dolin, MD;  Location: [facility];  Service: Endoscopy;;  esophageal    CARPAL TUNNEL RELEASE Left 2013   cervical disectomy  2002   CESAREAN SECTION N/A    Phreesia 03/18/2020   CHOLECYSTECTOMY     with lysis of adhesions for sbo; "ruptured gallbladder".   COLONOSCOPY  11/09/2011   RMR: Melanosis coli   COLONOSCOPY WITH PROPOFOL N/A 09/14/2015   Dr. Gala Romney: diverticulosis, 32m TA removed. next TCS 09/2020.    COLONOSCOPY WITH PROPOFOL N/A 04/30/2020   Procedure: COLONOSCOPY WITH PROPOFOL;  Surgeon: Daneil Dolin, MD;  Location: [facility];  Service: Endoscopy;  Laterality: N/A;  PM (ASA 3)   DENTAL SURGERY  11/2015   multiple tooth extraction   ESOPHAGOGASTRODUODENOSCOPY  11/29/2007   salmon-colored  tongue   longest stable at  3 cm, distal esophagus as described previously status post biopsy/ Hiatal hernia, otherwise normal stomach D1 and D2   ESOPHAGOGASTRODUODENOSCOPY  01/06/11   short segment Barrett's esophagus s/p bx/Hiatal hernia   ESOPHAGOGASTRODUODENOSCOPY (EGD) WITH PROPOFOL N/A 03/20/2014   BJY:NWGNFAOZ distal esophagus short segment barrett's, bx with  no dysplasia. next egd in 03/2017   ESOPHAGOGASTRODUODENOSCOPY (EGD) WITH PROPOFOL N/A 09/14/2015   Dr. Gala Romney: Barrett's without dysplasia, gastritis benign bx, hiatal hernia. next EGD 09/2018.   ESOPHAGOGASTRODUODENOSCOPY (EGD) WITH PROPOFOL N/A 07/23/2019   Procedure: ESOPHAGOGASTRODUODENOSCOPY (EGD) WITH PROPOFOL;  Surgeon: Daneil Dolin, MD;  [facility];  Service: Endoscopy;  Laterality: N/A;  3:00pm   EYE SURGERY N/A    Phreesia 03/18/2020   HERNIA REPAIR Right 07/2010   Dr. Zada Girt   IR IMAGING GUIDED PORT INSERTION  02/09/2021   JOINT REPLACEMENT     LAPAROSCOPIC CHOLECYSTECTOMY  2017   at [facility]   POLYPECTOMY  09/14/2015   Procedure: POLYPECTOMY;  Surgeon: Daneil Dolin, MD;  Location: [facility];  Service: Endoscopy;;  ascending colon   right hip replacement  07/2010   went back in sept 2012 to fix   SHOULDER ARTHROSCOPY  2008   left   SPINE SURGERY N/A    Phreesia 03/18/2020   TOTAL HIP REVISION Right 12/17/2012   Procedure: RIGHT TOTAL HIP REVISION;  Surgeon: Mauri Pole, MD;  Location: [facility];  Service: Orthopedics;  Laterality: Right;   WRIST SURGERY Right 2011   open reduction right wrist.    SOCIAL HISTORY:  Social History   Socioeconomic History   Marital status: Married    Spouse name: louis   Number of children: 4   Years of education: 12+   Highest education level: Some college, no degree  Occupational History   Occupation: Press photographer  - retired   Occupation: non profit  Tobacco Use   Smoking status: Former    Packs/day: 0.25    Years: 25.00    Total pack years: 6.25    Types: Cigarettes    Quit date: 02/07/2003    Years since quitting: 18.7   Smokeless tobacco: Never   Tobacco comments:    quit in 2004  Vaping Use   Vaping Use: Never used  Substance and Sexual Activity   Alcohol use: No   Drug use: No   Sexual activity: Not Currently    Birth control/protection: Surgical  Other Topics Concern   Not on file  Social History  Narrative   Not on file   Social Determinants of Health   Financial Resource Strain: Low Risk  (12/28/2020)   Overall Financial Resource Strain (CARDIA)    Difficulty of Paying Living Expenses: Not hard at all  Food Insecurity: No Food Insecurity (10/20/2021)   Hunger Vital Sign    Worried About Running Out of Food in the Last Year: Never true    [place] in the Last Year: Never true  Transportation Needs: No Transportation Needs (10/19/2021)   PRAPARE - Hydrologist (Medical): No    Lack of Transportation (Non-Medical): No  Physical Activity: Inactive (04/23/2021)   Exercise Vital Sign    Days of Exercise per  Week: 0 days    Minutes of Exercise per Session: 0 min  Stress: Stress Concern Present (04/23/2021)   [place]    Feeling of Stress : Rather much  Social Connections: Moderately Integrated (12/28/2020)   Social Connection and Isolation Panel [NHANES]    Frequency of Communication with Friends and Family: More than three times a week    Frequency of Social Gatherings with Friends and Family: Once a week    Attends Religious Services: More than 4 times per year    Active Member of [employer] or Organizations: No    Attends Archivist Meetings: Never    Marital Status: Married  Human resources officer Violence: Not At Risk (10/20/2021)   Humiliation, Afraid, Rape, and Kick questionnaire    Fear of Current or Ex-Partner: No    Emotionally Abused: No    Physically Abused: No    Sexually Abused: No    FAMILY HISTORY:  Family History  Problem Relation Age of Onset   Hypertension Mother    Stroke Mother    Colon cancer Neg Hx    Anesthesia problems Neg Hx    Hypotension Neg Hx    Malignant hyperthermia Neg Hx    Pseudochol deficiency Neg Hx    Gastric cancer Neg Hx    Esophageal cancer Neg Hx     CURRENT MEDICATIONS:  Current Outpatient Medications  Medication Sig  Dispense Refill   acyclovir (ZOVIRAX) 800 MG tablet Take 1 tablet (800 mg total) by mouth 5 (five) times daily. 50 tablet 1   acyclovir ointment (ZOVIRAX) 5 % Apply 1 application. topically every 3 (three) hours. 15 g 1   allopurinol (ZYLOPRIM) 300 MG tablet Take 1 tablet (300 mg total) by mouth daily. 30 tablet 3   citalopram (CELEXA) 20 MG tablet Take 1 tablet by mouth once daily 30 tablet 3   EPINEPHRINE 0.3 mg/0.3 mL IJ SOAJ injection INJECT 0.3 MLS INTO MUSCLE ONCE AS NEEDED (Patient taking differently: Inject 0.3 mg into the muscle as needed for anaphylaxis.) 1 Device 2   furosemide (LASIX) 20 MG tablet Take 1 tablet (20 mg total) by mouth daily as needed. 30 tablet 2   gabapentin (NEURONTIN) 300 MG capsule Take one capsule by mouth once daily, as needed, for pain 90 capsule 1   hydrALAZINE (APRESOLINE) 25 MG tablet Take 1 tablet (25 mg total) by mouth 3 (three) times daily. 90 tablet 1   lidocaine-prilocaine (EMLA) cream Apply 1 application. topically as needed. 30 g 3   losartan (COZAAR) 50 MG tablet Take 1 tablet (50 mg total) by mouth daily. 30 tablet 2   metoprolol tartrate (LOPRESSOR) 25 MG tablet Take 1 tablet (25 mg total) by mouth 2 (two) times daily. 90 tablet 1   mirtazapine (REMERON) 15 MG tablet Take 1 tablet (15 mg total) by mouth at bedtime. 30 tablet 5   oxyCODONE (OXY IR/ROXICODONE) 5 MG immediate release tablet Take 5 mg by mouth every 4 (four) hours as needed.     pantoprazole (PROTONIX) 40 MG tablet TAKE 1 TABLET BY MOUTH ONCE DAILY BEFORE BREAKFAST 90 tablet 0   potassium chloride (KLOR-CON) 10 MEQ tablet Take 1 tablet by mouth once daily 30 tablet 2   ruxolitinib phosphate (JAKAFI) 15 MG tablet Take 1 tablet (15 mg total) by mouth 2 (two) times daily. 60 tablet 3   temazepam (RESTORIL) 30 MG capsule Take 1 capsule (30 mg total) by mouth at bedtime as  needed for sleep. 30 capsule 5   tiZANidine (ZANAFLEX) 4 MG tablet Take 2 tablets (8 mg total) by mouth 3 (three) times  daily as needed (spasms). (Patient taking differently: Take 4-8 mg by mouth 3 (three) times daily as needed (spasms).) 180 tablet 0   No current facility-administered medications for this visit.   Facility-Administered Medications Ordered in Other Visits  Medication Dose Route Frequency Provider Last Rate Last Admin   lanreotide acetate (SOMATULINE DEPOT) 120 MG/0.5ML injection            octreotide (SANDOSTATIN LAR) 30 MG IM injection             ALLERGIES:  Allergies  Allergen Reactions   Bee Venom Swelling and Hives   Acetaminophen     itches   Amlodipine     Hair loss   Red Dye Other (See Comments)   Tyloxapol    Other Itching, Rash and Swelling   Oxycodone-Acetaminophen Rash    Pt states, "this gives her a rash, but at home she takes oxycodone for pain relief" Pt states, "this gives her a rash, but at home she takes oxycodone for pain relief"    PHYSICAL EXAM:  Performance status (ECOG): 1 - Symptomatic but completely ambulatory  There were no vitals filed for this visit.  Wt Readings from Last 3 Encounters:  10/19/21 130 lb 15.3 oz (59.4 kg)  10/08/21 131 lb (59.4 kg)  09/27/21 129 lb 8 oz (58.7 kg)   Physical Exam Vitals reviewed.  Constitutional:      Appearance: Normal appearance.  Cardiovascular:     Rate and Rhythm: Normal rate and regular rhythm.     Pulses: Normal pulses.     Heart sounds: Normal heart sounds.  Pulmonary:     Effort: Pulmonary effort is normal.     Breath sounds: Normal breath sounds.  Abdominal:     Palpations: Abdomen is soft. There is hepatomegaly (palpable 3 fingerbreadths below costal margin) and splenomegaly (2 fingerbreadths below costal margin'). There is no mass.     Tenderness: There is no abdominal tenderness.  Musculoskeletal:     Right lower leg: 1+ Edema present.     Left lower leg: 1+ Edema present.  Neurological:     General: No focal deficit present.     Mental Status: She is alert and oriented to person, place, and  time.  Psychiatric:        Mood and Affect: Mood normal.        Behavior: Behavior normal.      LABORATORY DATA:  I have reviewed the labs as listed.     Latest Ref Rng & Units 10/20/2021    7:06 AM 10/18/2021    2:04 PM 09/27/2021   12:49 PM  CBC  WBC 4.0 - 10.5 K/uL 99.6  115.7  139.8   Hemoglobin 12.0 - 15.0 g/dL 7.1  6.7  8.0   Hematocrit 36.0 - 46.0 % 21.7  20.6  24.1   Platelets 150 - 400 K/uL 102  108  106       Latest Ref Rng & Units 10/20/2021    7:06 AM 10/19/2021    3:40 PM 09/27/2021   12:49 PM  CMP  Glucose 70 - 99 mg/dL 94  89  102   BUN 8 - 23 mg/dL 36  38  32   Creatinine 0.44 - 1.00 mg/dL 1.75  1.95  1.37   Sodium 135 - 145 mmol/L 140  140  142  Potassium 3.5 - 5.1 mmol/L 4.2  4.6  4.2   Chloride 98 - 111 mmol/L 111  109  113   CO2 22 - 32 mmol/L 24  26  24    Calcium 8.9 - 10.3 mg/dL 8.2  8.5  8.2   Total Protein 6.5 - 8.1 g/dL 7.2  7.2  7.5   Total Bilirubin 0.3 - 1.2 mg/dL 0.8  1.0  0.6   Alkaline Phos 38 - 126 U/L 211  184  163   AST 15 - 41 U/L 65  57  49   ALT 0 - 44 U/L 38  34  23     DIAGNOSTIC IMAGING:  I have independently reviewed the scans and discussed with the patient. ECHOCARDIOGRAM COMPLETE  Result Date: 10/20/2021    ECHOCARDIOGRAM REPORT   Patient Name:   KATLYNNE MCKERCHER Date of Exam: 10/20/2021 Medical Rec #:  [id number]      Height:       65.0 in Accession #:    [id number]     Weight:       131.0 lb Date of Birth:  1945-08-14     BSA:          1.652 m Patient Age:    19 years       BP:           133/71 mmHg Patient Gender: F              HR:           67 bpm. Exam Location:  [name] Procedure: 2D Echo, Cardiac Doppler and Color Doppler Indications:    R01.1 Murmur  History:        Patient has no prior history of Echocardiogram examinations.                 Risk Factors:Hypertension. Lupus (systemic lupus erythematosus)                 (HCC) (From Hx).  Sonographer:    Alvino Chapel RCS Referring Phys: [place]  1.  Left ventricular ejection fraction, by estimation, is 55 to 60%. The left ventricle has normal function. The left ventricle has no regional wall motion abnormalities. Left ventricular diastolic parameters are consistent with Grade II diastolic dysfunction (pseudonormalization).  2. Right ventricular systolic function is normal. The right ventricular size is normal. Tricuspid regurgitation signal is inadequate for assessing PA pressure.  3. Left atrial size was moderately dilated.  4. Right atrial size was upper normal.  5. The mitral valve is grossly normal. Mild mitral valve regurgitation.  6. The aortic valve is tricuspid. Aortic valve regurgitation is mild to moderate. Aortic valve sclerosis/calcification is present, without any evidence of aortic stenosis. Aortic regurgitation PHT measures 447 msec. Aortic valve mean gradient measures 12.0 mmHg.  7. The inferior vena cava is normal in size with greater than 50% respiratory variability, suggesting right atrial pressure of 3 mmHg. Comparison(s): No prior Echocardiogram. FINDINGS  Left Ventricle: Left ventricular ejection fraction, by estimation, is 55 to 60%. The left ventricle has normal function. The left ventricle has no regional wall motion abnormalities. The left ventricular internal cavity size was normal in size. There is  borderline left ventricular hypertrophy. Left ventricular diastolic parameters are consistent with Grade II diastolic dysfunction (pseudonormalization). Right Ventricle: The right ventricular size is normal. No increase in right ventricular wall thickness. Right ventricular systolic function is normal. Tricuspid regurgitation signal is inadequate for assessing PA pressure.  Left Atrium: Left atrial size was moderately dilated. Right Atrium: Right atrial size was upper normal. Pericardium: There is no evidence of pericardial effusion. Mitral Valve: The mitral valve is grossly normal. Mild mitral valve regurgitation. Tricuspid Valve: The  tricuspid valve is grossly normal. Tricuspid valve regurgitation is trivial. Aortic Valve: The aortic valve is tricuspid. There is mild aortic valve annular calcification. Aortic valve regurgitation is mild to moderate. Aortic regurgitation PHT measures 447 msec. Aortic valve sclerosis/calcification is present, without any evidence of aortic stenosis. Aortic valve mean gradient measures 12.0 mmHg. Aortic valve peak gradient measures 25.6 mmHg. Aortic valve area, by VTI measures 1.63 cm. Pulmonic Valve: The pulmonic valve was grossly normal. Pulmonic valve regurgitation is trivial. Aorta: The aortic root is normal in size and structure. Venous: The inferior vena cava is normal in size with greater than 50% respiratory variability, suggesting right atrial pressure of 3 mmHg. IAS/Shunts: No atrial level shunt detected by color flow Doppler.  LEFT VENTRICLE PLAX 2D LVIDd:         4.40 cm   Diastology LVIDs:         3.00 cm   LV e' medial:    6.64 cm/s LV PW:         1.00 cm   LV E/e' medial:  17.5 LV IVS:        1.00 cm   LV e' lateral:   6.74 cm/s LVOT diam:     1.80 cm   LV E/e' lateral: 17.2 LV SV:         82 LV SV Index:   50 LVOT Area:     2.54 cm  RIGHT VENTRICLE RV S prime:     17.40 cm/s TAPSE (M-mode): 3.0 cm LEFT ATRIUM             Index        RIGHT ATRIUM           Index LA diam:        3.00 cm 1.82 cm/m   RA Area:     17.50 cm LA Vol (A2C):   79.9 ml 48.36 ml/m  RA Volume:   49.50 ml  29.96 ml/m LA Vol (A4C):   83.3 ml 50.41 ml/m LA Biplane Vol: 82.5 ml 49.93 ml/m  AORTIC VALVE AV Area (Vmax):    1.61 cm AV Area (Vmean):   1.69 cm AV Area (VTI):     1.63 cm AV Vmax:           253.00 cm/s AV Vmean:          152.000 cm/s AV VTI:            0.505 m AV Peak Grad:      25.6 mmHg AV Mean Grad:      12.0 mmHg LVOT Vmax:         160.50 cm/s LVOT Vmean:        101.050 cm/s LVOT VTI:          0.323 m LVOT/AV VTI ratio: 0.64 AI PHT:            447 msec  AORTA Ao Root diam: 3.10 cm MITRAL VALVE MV Area  (PHT): 2.29 cm     SHUNTS MV Decel Time: 331 msec     Systemic VTI:  0.32 m MV E velocity: 116.00 cm/s  Systemic Diam: 1.80 cm MV A velocity: 115.00 cm/s MV E/A ratio:  1.01 Rozann Lesches MD Electronically signed by Rozann Lesches MD Signature  Date/Time: 10/20/2021/6:01:40 PM    Final    DG Chest 2 View  Result Date: 10/19/2021 CLINICAL DATA:  Cough, anemia, low blood pressure EXAM: CHEST - 2 VIEW COMPARISON:  09/02/2021 FINDINGS: RIGHT jugular Port-A-Cath with tip projecting over SVC. Enlargement of cardiac silhouette with pulmonary vascular congestion. Mediastinal contours normal. Minimal chronic interstitial prominence stable. No pulmonary infiltrate, pleural effusion, or pneumothorax. No osseous abnormalities. IMPRESSION: Enlargement of cardiac silhouette with pulmonary vascular congestion. Minimal chronic interstitial prominence without acute infiltrate. Electronically Signed   By: Lavonia Dana M.D.   On: 10/19/2021 16:41     ASSESSMENT:  Leukocytosis with left shift: - CBC on 11/06/2020 with white count 75.6, differential 59% neutrophils, 12% monocytes, 4% lymphocytes, 1 percentage of eosinophils and basophils, 6% band neutrophils, 12% myelocytes, 1% promyelocytes, 29% blasts. - Pathologist review of blood smear reported as leukoerythroblastic reaction. - 25 pound weight loss in the last 6 months, unintentional.  Decreased appetite.  Reports fatigue for the last few months.  Reports night sweats x3 in the last 1 month. - Bone marrow biopsy on 11/18/2020 consistent with granulocytic proliferation with differential including CMML versus myeloproliferative disorder. - BCR/ABL was negative. 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 - NGS myeloid panel shows mutations in ASXL1, MPL, TET2, EZH2 mutations. - Spleen ultrasound on 12/16/2020 shows mildly enlarged measuring 12.4 x 9.6 x 7 cm with volume of 435 cc. - PDGFR alpha, beta and FGFR 1 was negative. - She was evaluated by  Dr. Florene Glen at [facility].  Slides were reviewed at [facility] hematopathology.  They thought it was less likely CMML and more likely MPL positive myeloproliferative neoplasm.  Dr. Florene Glen has recommended initiate Jakafi. - Ruxolitinib 10 mg twice daily started on 02/16/2021.  Dose increased to 15 mg twice daily on 05/18/2021. - CTAP on 04/07/2021: Hepatomegaly and splenomegaly measuring 14.3 cm.  No other pathology.  No spleen infarcts.  2.  Social/family history: - She lives at home and is able to do all her ADLs and IADLs although she is getting tired lately.  She reports quitting smoking 6 months ago and smoked 1 pack/week for 43 years. - She believes that her mother had some kind of leukemia.  No other malignancies.   PLAN:  MPL positive myeloproliferative neoplasm: -Ruxolitinib dose increased to 15 mg twice daily on 09/20/2021. - Ultrasound spleen (09/21/2021): 13.6 cm with volume 532 mL.  This is slightly improved from previous volume.      - Bone marrow biopsy (09/09/2021): Material is limited but overall features consistent with myeloid neoplasm.  No increase in blasts.  Erythroid precursors appear decreased but megakaryocytes are variably evident with clusters and small forms.  Reticulin's shows variable increase in reticulin fibers including areas with prominent increase.  Chromosome analysis was normal.  No significant changes at this time.  I have recommended continuing Jakafi 15 mg twice daily.  She reports that she is seeking a second opinion at [facility].  I plan to repeat her CBC in 3 weeks to see if she needs any blood transfusion.  RTC 6 weeks for follow-up with repeat labs.  2.  Decreased appetite: - Her weight has been stable since the discharge.  3.  CKD: - Baseline creatinine between 1.3-1.5.  Today it is 1.37.  4.  Hypokalemia: - Continue potassium supplements.  Potassium today is normal.  5.  Generalized itching: - She reported itching all over from the inside out  since last 1 week.  1 week ago, metoprolol dose was increased to 1 and half tablets twice daily.  Ruxolitinib was also increased to 15 mg twice daily.  However she has previously taken ruxolitinib at the same dose and did not have any itching.  No other vasomotor symptoms. - Her blood pressure is also high at 182/73.  She is going to see Dr. Moshe Cipro next week for titration of blood pressure medication.    Orders placed this encounter:  No orders of the defined types were placed in this encounter.     Derek Jack, MD [place] [phone]

## 2021-10-21 NOTE — Telephone Encounter (Signed)
Pt has an appointment today

## 2021-10-21 NOTE — Patient Instructions (Addendum)
F/U as before, call if you need me sooner  Yes please do fill and take the hydralazine 3 times daily as prescribed, and the other medications that you were discharged on for uncontrolled blood pressure  You are referred urgently to cardiology because of uncontrolled blood pressure and irregular heart rate  You are referred to Urology for frequent/ persistent UTI  We will contact you tomorrow re RSV test  You will be referred to lung specialiost re chronic cough

## 2021-10-22 ENCOUNTER — Other Ambulatory Visit: Payer: Self-pay

## 2021-10-22 LAB — TYPE AND SCREEN
ABO/RH(D): O POS
Antibody Screen: POSITIVE
DAT, IgG: NEGATIVE
Donor AG Type: NEGATIVE
Donor AG Type: NEGATIVE
Unit division: 0
Unit division: 0

## 2021-10-22 LAB — BPAM RBC
Blood Product Expiration Date: 202310022359
Blood Product Expiration Date: 202310022359
ISSUE DATE / TIME: 202309121113
ISSUE DATE / TIME: 202309121113
Unit Type and Rh: 5100
Unit Type and Rh: 5100

## 2021-10-22 LAB — URINE CULTURE: Culture: >=100000 — AB

## 2021-10-22 MED ORDER — SULFAMETHOXAZOLE-TRIMETHOPRIM 800-160 MG PO TABS: 1.0000 | ORAL_TABLET | Freq: Two times a day (BID) | ORAL | 0 refills | Status: DC

## 2021-10-25 ENCOUNTER — Telehealth: Payer: Self-pay

## 2021-10-25 NOTE — Telephone Encounter (Signed)
Breathing treatment from hospital with nebulizer for home. This was not discuss last week, forgot to mention to provider last week.  Please contact patient at [phone].

## 2021-10-26 ENCOUNTER — Ambulatory Visit: Payer: Medicare Other | Admitting: Nurse Practitioner

## 2021-10-26 ENCOUNTER — Other Ambulatory Visit: Payer: Self-pay

## 2021-10-26 ENCOUNTER — Inpatient Hospital Stay (HOSPITAL_BASED_OUTPATIENT_CLINIC_OR_DEPARTMENT_OTHER): Payer: Medicare Other | Admitting: Nurse Practitioner

## 2021-10-26 ENCOUNTER — Other Ambulatory Visit (HOSPITAL_COMMUNITY)
Admission: RE | Admit: 2021-10-26 | Discharge: 2021-10-26 | Disposition: A | Discharge status: Home / Self Care | Payer: Medicare Other | Source: Ambulatory Visit | Attending: Hematology | Admitting: Hematology

## 2021-10-26 VITALS — BP 115/64 | HR 65 | Temp 98.3°F | Resp 18 | Ht 65.0 in | Wt 134.2 lb

## 2021-10-26 DIAGNOSIS — D471 Chronic myeloproliferative disease: Secondary | ICD-10-CM

## 2021-10-26 DIAGNOSIS — D649 Anemia, unspecified: Secondary | ICD-10-CM

## 2021-10-26 DIAGNOSIS — R059 Cough, unspecified: Secondary | ICD-10-CM | POA: Diagnosis not present

## 2021-10-26 DIAGNOSIS — N189 Chronic kidney disease, unspecified: Secondary | ICD-10-CM | POA: Diagnosis not present

## 2021-10-26 DIAGNOSIS — N3000 Acute cystitis without hematuria: Secondary | ICD-10-CM

## 2021-10-26 DIAGNOSIS — Z95828 Presence of other vascular implants and grafts: Secondary | ICD-10-CM

## 2021-10-26 DIAGNOSIS — L299 Pruritus, unspecified: Secondary | ICD-10-CM | POA: Diagnosis not present

## 2021-10-26 DIAGNOSIS — Z79899 Other long term (current) drug therapy: Secondary | ICD-10-CM | POA: Diagnosis not present

## 2021-10-26 DIAGNOSIS — E876 Hypokalemia: Secondary | ICD-10-CM | POA: Diagnosis not present

## 2021-10-26 LAB — CBC WITH DIFFERENTIAL/PLATELET
Abs Immature Granulocytes: 26.3 10*3/uL — ABNORMAL HIGH (ref 0.00–0.07)
Basophils Absolute: 0 10*3/uL (ref 0.0–0.1)
Basophils Relative: 0 %
Blasts: 1 %
Eosinophils Absolute: 2.5 10*3/uL — ABNORMAL HIGH (ref 0.0–0.5)
Eosinophils Relative: 2 %
HCT: 22 % — ABNORMAL LOW (ref 36.0–46.0)
Hemoglobin: 7 g/dL — ABNORMAL LOW (ref 12.0–15.0)
Lymphocytes Relative: 3 %
Lymphs Abs: 3.8 10*3/uL (ref 0.7–4.0)
MCH: 30.3 pg (ref 26.0–34.0)
MCHC: 31.8 g/dL (ref 30.0–36.0)
MCV: 95.2 fL (ref 80.0–100.0)
Metamyelocytes Relative: 10 %
Monocytes Absolute: 13.8 10*3/uL — ABNORMAL HIGH (ref 0.1–1.0)
Monocytes Relative: 11 %
Myelocytes: 6 %
Neutro Abs: 77.7 10*3/uL — ABNORMAL HIGH (ref 1.7–7.7)
Neutrophils Relative %: 62 %
Platelets: 112 10*3/uL — ABNORMAL LOW (ref 150–400)
Promyelocytes Relative: 5 %
RBC: 2.31 MIL/uL — ABNORMAL LOW (ref 3.87–5.11)
RDW: 21.7 % — ABNORMAL HIGH (ref 11.5–15.5)
WBC: 125.3 10*3/uL (ref 4.0–10.5)
nRBC: 3.1 % — ABNORMAL HIGH (ref 0.0–0.2)

## 2021-10-26 LAB — COMPREHENSIVE METABOLIC PANEL
ALT: 25 U/L (ref 0–44)
AST: 39 U/L (ref 15–41)
Albumin: 4.1 g/dL (ref 3.5–5.0)
Alkaline Phosphatase: 205 U/L — ABNORMAL HIGH (ref 38–126)
Anion gap: 6 (ref 5–15)
BUN: 28 mg/dL — ABNORMAL HIGH (ref 8–23)
CO2: 21 mmol/L — ABNORMAL LOW (ref 22–32)
Calcium: 8.6 mg/dL — ABNORMAL LOW (ref 8.9–10.3)
Chloride: 111 mmol/L (ref 98–111)
Creatinine, Ser: 2.21 mg/dL — ABNORMAL HIGH (ref 0.44–1.00)
GFR, Estimated: 23 mL/min — ABNORMAL LOW (ref >60)
Glucose, Bld: 97 mg/dL (ref 70–99)
Potassium: 4.4 mmol/L (ref 3.5–5.1)
Sodium: 138 mmol/L (ref 135–145)
Total Bilirubin: 0.6 mg/dL (ref 0.3–1.2)
Total Protein: 7.8 g/dL (ref 6.5–8.1)

## 2021-10-26 LAB — PREPARE RBC (CROSSMATCH)

## 2021-10-26 LAB — SAMPLE TO BLOOD BANK

## 2021-10-26 NOTE — Telephone Encounter (Signed)
Patient calling back will drop off sputum . Will come by our office to get bottle.

## 2021-10-26 NOTE — Progress Notes (Unsigned)
CRITICAL VALUE ALERT Critical value received:  WBC 125.3. Date of notification:  10-26-2021 Time of notification: 15:38 pm  Critical value read back:  Yes.   Nurse who received alert:  B. Yoali Conry RN.  MD notified time and response:  S.Johnson PA @ 15:49 . No new orders received.   HGB 7.0. Patient to receive 1 unit of blood on Friday 10-29-2021.

## 2021-10-26 NOTE — Telephone Encounter (Signed)
ordered

## 2021-10-28 ENCOUNTER — Encounter: Payer: Self-pay | Admitting: Internal Medicine

## 2021-10-28 ENCOUNTER — Ambulatory Visit (INDEPENDENT_AMBULATORY_CARE_PROVIDER_SITE_OTHER): Payer: Medicare Other | Admitting: Internal Medicine

## 2021-10-28 VITALS — BP 130/72 | HR 67 | Ht 65.0 in | Wt 130.0 lb

## 2021-10-28 DIAGNOSIS — D471 Chronic myeloproliferative disease: Secondary | ICD-10-CM

## 2021-10-28 DIAGNOSIS — D649 Anemia, unspecified: Secondary | ICD-10-CM

## 2021-10-28 DIAGNOSIS — R0989 Other specified symptoms and signs involving the circulatory and respiratory systems: Secondary | ICD-10-CM

## 2021-10-28 DIAGNOSIS — J209 Acute bronchitis, unspecified: Secondary | ICD-10-CM

## 2021-10-28 MED ORDER — ALBUTEROL SULFATE HFA 108 (90 BASE) MCG/ACT IN AERS: 2.0000 | INHALATION_SPRAY | Freq: Four times a day (QID) | RESPIRATORY_TRACT | 0 refills | Status: DC | PRN

## 2021-10-28 MED ORDER — GUAIFENESIN-CODEINE 100-10 MG/5ML PO SYRP: 5.0000 mL | ORAL_SOLUTION | Freq: Three times a day (TID) | ORAL | 0 refills | Status: DC | PRN

## 2021-10-28 NOTE — Assessment & Plan Note (Signed)
No active signs of bleeding currently Likely due to MPN Has had PRBC transfusions in the past CBC showed Hb - 7.0 about 2 days ago Going to get PRBC transfusion tomorrow

## 2021-10-28 NOTE — Progress Notes (Signed)
Acute Office Visit  Subjective:    Patient ID: Renee Waters, female    DOB: 29-Nov-1945, 76 y.o.   MRN: [id number]  Chief Complaint  Patient presents with   Acute Visit    Shortness of breath green phlegm.    HPI Patient is in today for complaint of shortness of breath and cough with greenish expectoration.  She was recently admitted in the hospital for chronic fatigue and acute shortness of breath.  She was found to have acute on chronic anemia, had recently received 1 unit PRBC. Her last Hb was 7.0 on 09/19.  Of note, she has history of MPN, followed by oncology.  Today, she is able to speak in full sentences.  Her O2 sat was WNL on room air.  Her last chest x-ray showed pulmonary vascular congestion.  She has not been taking her Lasix recently.  She states that she wants to get breathing treatment and wants to be referred to [facility] for [facility]. Of note, she also appears to be anxious today.  She takes temazepam and Remeron for insomnia/anxiety.  She denies any fever, chills, wheezing, hemoptysis or night sweats.  She is currently on Bactrim for UTI.  Past Medical History:  Diagnosis Date   Acute cholangitis    Allergy    Anemia    Anxiety    Arthritis    Phreesia 03/18/2020   Barrett's esophagus    Cataract    Chronic back pain    Chronic neck pain    CKD (chronic kidney disease) stage 3, GFR 30-59 ml/min ([place]) 10/29/2020   Depression    Genital herpes    GERD (gastroesophageal reflux disease)    H/O degenerative disc disease    History of blood transfusion    Hypertension    Insomnia    Lupus (systemic lupus erythematosus) ([place])    Neuromuscular disorder ([place])    Osteoarthritis    S/P colonoscopy June 2005   normal, no polyps   S/P endoscopy June 2005, Oct 2009   2005: short-segment Barrett's, 2009: short-segment Barrett's   Upper respiratory tract infection 07/09/2020   UTI (lower urinary tract infection) 11/2012    Past Surgical History:  Procedure Laterality  Date   ABDOMINAL HYSTERECTOMY     BACK SURGERY     BIOPSY N/A 03/20/2014   Procedure: BIOPSY;  Surgeon: Daneil Dolin, MD;  [facility]: [facility];  Service: Endoscopy;  Laterality: N/A;   BIOPSY  09/14/2015   Procedure: BIOPSY;  Surgeon: Daneil Dolin, MD;  [facility];  Service: Endoscopy;;  esophageal and gastric   BIOPSY  07/23/2019   Procedure: BIOPSY;  Surgeon: Daneil Dolin, MD;  [facility];  Service: Endoscopy;;  esophageal    CARPAL TUNNEL RELEASE Left 2013   cervical disectomy  2002   CESAREAN SECTION N/A    Phreesia 03/18/2020   CHOLECYSTECTOMY     with lysis of adhesions for sbo; "ruptured gallbladder".   COLONOSCOPY  11/09/2011   RMR: Melanosis coli   COLONOSCOPY WITH PROPOFOL N/A 09/14/2015   Dr. Gala Romney: diverticulosis, 52m TA removed. next TCS 09/2020.    COLONOSCOPY WITH PROPOFOL N/A 04/30/2020   Procedure: COLONOSCOPY WITH PROPOFOL;  Surgeon: RDaneil Dolin MD;  [facility];  Service: Endoscopy;  Laterality: N/A;  PM (ASA 3)   DENTAL SURGERY  11/2015   multiple tooth extraction   ESOPHAGOGASTRODUODENOSCOPY  11/29/2007   salmon-colored  tongue   longest stable at  3  cm, distal esophagus as described previously status post biopsy/ Hiatal hernia, otherwise normal stomach D1 and D2   ESOPHAGOGASTRODUODENOSCOPY  01/06/11   short segment Barrett's esophagus s/p bx/Hiatal hernia   ESOPHAGOGASTRODUODENOSCOPY (EGD) WITH PROPOFOL N/A 03/20/2014   SEG:BTDVVOHY distal esophagus short segment barrett's, bx with no dysplasia. next egd in 03/2017   ESOPHAGOGASTRODUODENOSCOPY (EGD) WITH PROPOFOL N/A 09/14/2015   Dr. Gala Romney: Barrett's without dysplasia, gastritis benign bx, hiatal hernia. next EGD 09/2018.   ESOPHAGOGASTRODUODENOSCOPY (EGD) WITH PROPOFOL N/A 07/23/2019   Procedure: ESOPHAGOGASTRODUODENOSCOPY (EGD) WITH PROPOFOL;  Surgeon: Daneil Dolin, MD;  [facility];  Service: Endoscopy;  Laterality: N/A;  3:00pm   EYE SURGERY N/A    Phreesia  03/18/2020   HERNIA REPAIR Right 07/2010   Dr. Zada Girt   IR IMAGING GUIDED PORT INSERTION  02/09/2021   JOINT REPLACEMENT     LAPAROSCOPIC CHOLECYSTECTOMY  2017   at [facility]   POLYPECTOMY  09/14/2015   Procedure: POLYPECTOMY;  Surgeon: Daneil Dolin, MD;  [facility];  Service: Endoscopy;;  ascending colon   right hip replacement  07/2010   went back in sept 2012 to fix   SHOULDER ARTHROSCOPY  2008   left   SPINE SURGERY N/A    Phreesia 03/18/2020   TOTAL HIP REVISION Right 12/17/2012   Procedure: RIGHT TOTAL HIP REVISION;  Surgeon: Mauri Pole, MD;  [facility];  Service: Orthopedics;  Laterality: Right;   WRIST SURGERY Right 2011   open reduction right wrist.    Family History  Problem Relation Age of Onset   Hypertension Mother    Stroke Mother    Colon cancer Neg Hx    Anesthesia problems Neg Hx    Hypotension Neg Hx    Malignant hyperthermia Neg Hx    Pseudochol deficiency Neg Hx    Gastric cancer Neg Hx    Esophageal cancer Neg Hx     Social History   Socioeconomic History   Marital status: Married    Spouse name: louis   Number of children: 4   Years of education: 12+   Highest education level: Some college, no degree  Occupational History   Occupation: Press photographer  - retired   Occupation: non profit  Tobacco Use   Smoking status: Former    Packs/day: 0.25    Years: 25.00    Total pack years: 6.25    Types: Cigarettes    Quit date: 02/07/2003    Years since quitting: 18.7   Smokeless tobacco: Never   Tobacco comments:    quit in 2004  Vaping Use   Vaping Use: Never used  Substance and Sexual Activity   Alcohol use: No   Drug use: No   Sexual activity: Not Currently    Birth control/protection: Surgical  Other Topics Concern   Not on file  Social History Narrative   Not on file   Social Determinants of Health   Financial Resource Strain: Low Risk  (12/28/2020)   Overall Financial Resource Strain (CARDIA)    Difficulty of  Paying Living Expenses: Not hard at all  Food Insecurity: No Food Insecurity (10/20/2021)   Hunger Vital Sign    Worried About Running Out of Food in the Last Year: Never true    Ran Out of Food in the Last Year: Never true  Transportation Needs: No Transportation Needs (10/19/2021)   PRAPARE - Hydrologist (Medical): No    Lack of Transportation (Non-Medical):  No  Physical Activity: Inactive (04/23/2021)   Exercise Vital Sign    Days of Exercise per Week: 0 days    Minutes of Exercise per Session: 0 min  Stress: Stress Concern Present (04/23/2021)   [place]    Feeling of Stress : Rather much  Social Connections: Moderately Integrated (12/28/2020)   Social Connection and Isolation Panel [NHANES]    Frequency of Communication with Friends and Family: More than three times a week    Frequency of Social Gatherings with Friends and Family: Once a week    Attends Religious Services: More than 4 times per year    Active Member of [employer] or Organizations: No    Attends Archivist Meetings: Never    Marital Status: Married  Human resources officer Violence: Not At Risk (10/20/2021)   Humiliation, Afraid, Rape, and Kick questionnaire    Fear of Current or Ex-Partner: No    Emotionally Abused: No    Physically Abused: No    Sexually Abused: No    Outpatient Medications Prior to Visit  Medication Sig Dispense Refill   acyclovir (ZOVIRAX) 800 MG tablet Take 1 tablet (800 mg total) by mouth 5 (five) times daily. 50 tablet 1   acyclovir ointment (ZOVIRAX) 5 % Apply 1 application. topically every 3 (three) hours. 15 g 1   allopurinol (ZYLOPRIM) 300 MG tablet Take 1 tablet (300 mg total) by mouth daily. 30 tablet 3   citalopram (CELEXA) 20 MG tablet Take 1 tablet by mouth once daily 30 tablet 3   EPINEPHRINE 0.3 mg/0.3 mL IJ SOAJ injection INJECT 0.3 MLS INTO MUSCLE ONCE AS NEEDED (Patient taking  differently: Inject 0.3 mg into the muscle as needed for anaphylaxis.) 1 Device 2   furosemide (LASIX) 20 MG tablet Take 1 tablet (20 mg total) by mouth daily as needed. 30 tablet 2   gabapentin (NEURONTIN) 300 MG capsule Take one capsule by mouth once daily, as needed, for pain 90 capsule 1   hydrALAZINE (APRESOLINE) 25 MG tablet Take 1 tablet (25 mg total) by mouth 3 (three) times daily. 90 tablet 1   lidocaine-prilocaine (EMLA) cream Apply 1 application. topically as needed. 30 g 3   losartan (COZAAR) 50 MG tablet Take 1 tablet (50 mg total) by mouth daily. 30 tablet 2   metoprolol tartrate (LOPRESSOR) 25 MG tablet Take 1 tablet (25 mg total) by mouth 2 (two) times daily. 90 tablet 1   mirtazapine (REMERON) 15 MG tablet Take 1 tablet (15 mg total) by mouth at bedtime. 30 tablet 5   oxyCODONE (OXY IR/ROXICODONE) 5 MG immediate release tablet Take 5 mg by mouth every 4 (four) hours as needed.     pantoprazole (PROTONIX) 40 MG tablet TAKE 1 TABLET BY MOUTH ONCE DAILY BEFORE BREAKFAST 90 tablet 0   potassium chloride (KLOR-CON) 10 MEQ tablet Take 1 tablet by mouth once daily 30 tablet 2   ruxolitinib phosphate (JAKAFI) 15 MG tablet Take 1 tablet (15 mg total) by mouth 2 (two) times daily. 60 tablet 3   sulfamethoxazole-trimethoprim (BACTRIM DS) 800-160 MG tablet Take 1 tablet by mouth 2 (two) times daily. 20 tablet 0   temazepam (RESTORIL) 30 MG capsule Take 1 capsule (30 mg total) by mouth at bedtime as needed for sleep. 30 capsule 5   tiZANidine (ZANAFLEX) 4 MG tablet Take 2 tablets (8 mg total) by mouth 3 (three) times daily as needed (spasms). (Patient taking differently: Take 4-8 mg by  mouth 3 (three) times daily as needed (spasms).) 180 tablet 0   Facility-Administered Medications Prior to Visit  Medication Dose Route Frequency Provider Last Rate Last Admin   lanreotide acetate (SOMATULINE DEPOT) 120 MG/0.5ML injection            octreotide (SANDOSTATIN LAR) 30 MG IM injection              Allergies  Allergen Reactions   Bee Venom Swelling and Hives   Acetaminophen     itches   Amlodipine     Hair loss   Red Dye Other (See Comments)   Tyloxapol    Other Itching, Rash and Swelling   Oxycodone-Acetaminophen Rash    Pt states, "this gives her a rash, but at home she takes oxycodone for pain relief" Pt states, "this gives her a rash, but at home she takes oxycodone for pain relief"    Review of Systems  Constitutional:  Positive for fatigue. Negative for chills and fever.  HENT:  Negative for congestion, sinus pressure and sore throat.   Respiratory:  Positive for cough and shortness of breath. Negative for wheezing.   Cardiovascular:  Negative for chest pain and palpitations.  Gastrointestinal:  Negative for nausea and vomiting.  Genitourinary:  Negative for dysuria and hematuria.  Skin:  Negative for rash.  Neurological:  Positive for weakness. Negative for dizziness.  Psychiatric/Behavioral:  Positive for dysphoric mood. The patient is nervous/anxious.        Objective:    Physical Exam Constitutional:      General: She is not in acute distress.    Appearance: She is not diaphoretic.  HENT:     Nose: No congestion.     Mouth/Throat:     Mouth: Mucous membranes are moist.     Pharynx: No posterior oropharyngeal erythema.  Eyes:     General: No scleral icterus.    Extraocular Movements: Extraocular movements intact.  Cardiovascular:     Rate and Rhythm: Normal rate and regular rhythm.     Heart sounds: Normal heart sounds. No murmur heard. Pulmonary:     Breath sounds: No wheezing or rales.     Comments: Decreased breath sounds in the b/l lower lung fields Neurological:     Mental Status: She is alert.     BP 130/72 (BP Location: Left Arm, Patient Position: Sitting)   Pulse 67   Ht '5\' 5"'$  (1.651 m)   Wt 130 lb (59 kg)   SpO2 99%   BMI 21.63 kg/m  Wt Readings from Last 3 Encounters:  10/28/21 130 lb (59 kg)  10/26/21 134 lb 3.2 oz (60.9  kg)  10/19/21 130 lb 15.3 oz (59.4 kg)        Assessment & Plan:   Problem List Items Addressed This Visit       Other   MPN (myeloproliferative neoplasm) ([place])    Has chronic fatigue likely due to MPN Has symptomatic anemia, most likely contributing to her dyspnea Has had PRBC transfusion Followed by oncology      Symptomatic anemia    No active signs of bleeding currently Likely due to MPN Has had PRBC transfusions in the past CBC showed Hb - 7.0 about 2 days ago Going to get PRBC transfusion tomorrow      Other Visit Diagnoses     Acute bronchitis, unspecified organism    -  Primary Has remote smoking history Has persistent cough, unable to bring out sputum Cheratussin as needed for cough  Would avoid steroids as she has pulmonary vascular congestion Albuterol inhaler as needed for dyspnea   Relevant Medications   albuterol (VENTOLIN HFA) 108 (90 Base) MCG/ACT inhaler   guaiFENesin-codeine (ROBITUSSIN AC) 100-10 MG/5ML syrup   Pulmonary vascular congestion     Advised to take Lasix 1 dose today She has had dehydration in the past, advised to avoid further Lasix dosing May require Lasix after PRBC transfusion        Meds ordered this encounter  Medications   albuterol (VENTOLIN HFA) 108 (90 Base) MCG/ACT inhaler    Sig: Inhale 2 puffs into the lungs every 6 (six) hours as needed for wheezing or shortness of breath.    Dispense:  8 g    Refill:  0    Okay to substitute to generic/formulary Albuterol.   guaiFENesin-codeine (ROBITUSSIN AC) 100-10 MG/5ML syrup    Sig: Take 5 mLs by mouth 3 (three) times daily as needed for cough.    Dispense:  120 mL    Refill:  0     [place]ra [place] Keith Rake, MD

## 2021-10-28 NOTE — Assessment & Plan Note (Addendum)
Has chronic fatigue likely due to MPN Has symptomatic anemia, most likely contributing to her dyspnea Has had PRBC transfusion Followed by oncology

## 2021-10-28 NOTE — Patient Instructions (Signed)
Please take Cheratussin as needed for cough.  Please use Albuterol for shortness of breath or wheezing.  Please go to ER if there is sudden onset shortness of breath, not relieved with Albuterol inhaler or if you have chest pain or palpitations.

## 2021-10-28 NOTE — Progress Notes (Signed)
.  acute

## 2021-10-29 ENCOUNTER — Inpatient Hospital Stay: Payer: Medicare Other

## 2021-10-29 ENCOUNTER — Encounter: Payer: Self-pay | Admitting: Urology

## 2021-10-29 ENCOUNTER — Ambulatory Visit (INDEPENDENT_AMBULATORY_CARE_PROVIDER_SITE_OTHER): Payer: Medicare Other | Admitting: Urology

## 2021-10-29 VITALS — BP 74/38 | HR 72 | Ht 65.0 in | Wt 128.0 lb

## 2021-10-29 DIAGNOSIS — N390 Urinary tract infection, site not specified: Secondary | ICD-10-CM | POA: Diagnosis not present

## 2021-10-29 DIAGNOSIS — D72829 Elevated white blood cell count, unspecified: Secondary | ICD-10-CM

## 2021-10-29 DIAGNOSIS — Z79899 Other long term (current) drug therapy: Secondary | ICD-10-CM | POA: Diagnosis not present

## 2021-10-29 DIAGNOSIS — L299 Pruritus, unspecified: Secondary | ICD-10-CM | POA: Diagnosis not present

## 2021-10-29 DIAGNOSIS — N189 Chronic kidney disease, unspecified: Secondary | ICD-10-CM | POA: Diagnosis not present

## 2021-10-29 DIAGNOSIS — D471 Chronic myeloproliferative disease: Secondary | ICD-10-CM

## 2021-10-29 DIAGNOSIS — E876 Hypokalemia: Secondary | ICD-10-CM | POA: Diagnosis not present

## 2021-10-29 LAB — BLADDER SCAN AMB NON-IMAGING: Scan Result: 424

## 2021-10-29 MED ORDER — ACETAMINOPHEN 325 MG PO TABS
650.0000 mg | ORAL_TABLET | Freq: Once | ORAL | Status: AC
  Administered 2021-10-29: 650 mg via ORAL
  Filled 2021-10-29: qty 2

## 2021-10-29 MED ORDER — SODIUM CHLORIDE 0.9% IV SOLUTION
250.0000 mL | Freq: Once | INTRAVENOUS | Status: AC
  Administered 2021-10-29: 250 mL via INTRAVENOUS

## 2021-10-29 MED ORDER — TRIMETHOPRIM 100 MG PO TABS: 100.0000 mg | ORAL_TABLET | Freq: Every day | ORAL | 2 refills | Status: DC

## 2021-10-29 MED ORDER — HEPARIN SOD (PORK) LOCK FLUSH 100 UNIT/ML IV SOLN
250.0000 [IU] | INTRAVENOUS | Status: AC | PRN
  Administered 2021-10-29: 250 [IU]

## 2021-10-29 MED ORDER — SODIUM CHLORIDE 0.9% FLUSH
10.0000 mL | INTRAVENOUS | Status: AC | PRN
  Administered 2021-10-29: 10 mL

## 2021-10-29 NOTE — Progress Notes (Signed)
Patient continues to have labile blood pressures.  BP today 81/39.  She reports some mild dizziness.  Discussed with PCP (Dr. Posey Pronto on behalf of Dr. Moshe Cipro), and we will discontinue hydralazine at present, with further adjustments of BP medications at PCP discretion.  Patient also reminded to not take temazepam, oxycodone, or muscle relaxant at the same time, as these could cause decreased mental status and hypotension.  Patient verbalizes understanding and agreement.

## 2021-10-29 NOTE — Progress Notes (Signed)
Assessment: 1. Frequent UTI     Plan: I personally reviewed the patient records including lab results with all available urine cultures and imaging results including CT from 08/25/2021. Methods to reduce the risk of UTIs discussed with the patient including increased fluid intake, timed and double voiding, daily cranberry supplement, daily probiotic, vaginal hormone replacement, and potential role of a prophylactic antibiotic in prevention of UTIs. Recommend timed and double voiding Begin trimethoprim 100 mg daily for UTI prevention. Return to office in 4 weeks with bladder scan.   Chief Complaint:  Chief Complaint  Patient presents with   Recurrent UTI    History of Present Illness:  Renee Waters is a 76 y.o. female who is seen in consultation from [name], MD for evaluation of frequent UTIs.  She has had frequent UTIs for approximately 2 months.  No history of frequent UTIs prior to this.  Typical symptoms associated with the UTI include bladder discomfort, dysuria, increased frequency, and urgency.  No gross hematuria or flank pain.  No fevers or chills.  She does have baseline symptoms of urinary frequency, some urgency, and stress incontinence.  Her symptoms typically resolve after antibiotic therapy.  She is currently on Bactrim DS for a UTI.  She is not having significant symptoms today.  Urine culture results: 08/25/2021 20 K E. Coli 09/04/2021 no growth 10/10/2021 >100 K E. Coli 10/20/2021 >100 K E. Coli  CT abdomen and pelvis from 08/25/2021 showed normal kidneys without stone or obstruction, no ureteral calculi, and a normal-appearing bladder.  She is undergoing treatment for a myeloproliferative neoplasm.   Past Medical History:  Past Medical History:  Diagnosis Date   Acute cholangitis    Allergy    Anemia    Anxiety    Arthritis    Phreesia 03/18/2020   Barrett's esophagus    Cataract    Chronic back pain    Chronic neck pain    CKD (chronic  kidney disease) stage 3, GFR 30-59 ml/min ([place]) 10/29/2020   Depression    Genital herpes    GERD (gastroesophageal reflux disease)    H/O degenerative disc disease    History of blood transfusion    Hypertension    Insomnia    Lupus (systemic lupus erythematosus) ([place])    Neuromuscular disorder ([place])    Osteoarthritis    S/P colonoscopy June 2005   normal, no polyps   S/P endoscopy June 2005, Oct 2009   2005: short-segment Barrett's, 2009: short-segment Barrett's   Upper respiratory tract infection 07/09/2020   UTI (lower urinary tract infection) 11/2012    Past Surgical History:  Past Surgical History:  Procedure Laterality Date   ABDOMINAL HYSTERECTOMY     BACK SURGERY     BIOPSY N/A 03/20/2014   Procedure: BIOPSY;  Surgeon: Daneil Dolin, MD;  [facility];  Service: Endoscopy;  Laterality: N/A;   BIOPSY  09/14/2015   Procedure: BIOPSY;  Surgeon: Daneil Dolin, MD;  [facility];  Service: Endoscopy;;  esophageal and gastric   BIOPSY  07/23/2019   Procedure: BIOPSY;  Surgeon: Daneil Dolin, MD;  [facility];  Service: Endoscopy;;  esophageal    CARPAL TUNNEL RELEASE Left 2013   cervical disectomy  2002   CESAREAN SECTION N/A    Phreesia 03/18/2020   CHOLECYSTECTOMY     with lysis of adhesions for sbo; "ruptured gallbladder".   COLONOSCOPY  11/09/2011   RMR: Melanosis coli   COLONOSCOPY WITH  PROPOFOL N/A 09/14/2015   Dr. Gala Romney: diverticulosis, 71m TA removed. next TCS 09/2020.    COLONOSCOPY WITH PROPOFOL N/A 04/30/2020   Procedure: COLONOSCOPY WITH PROPOFOL;  Surgeon: RDaneil Dolin MD;  [facility];  Service: Endoscopy;  Laterality: N/A;  PM (ASA 3)   DENTAL SURGERY  11/2015   multiple tooth extraction   ESOPHAGOGASTRODUODENOSCOPY  11/29/2007   salmon-colored  tongue   longest stable at  3 cm, distal esophagus as described previously status post biopsy/ Hiatal hernia, otherwise normal stomach D1 and D2   ESOPHAGOGASTRODUODENOSCOPY   01/06/11   short segment Barrett's esophagus s/p bx/Hiatal hernia   ESOPHAGOGASTRODUODENOSCOPY (EGD) WITH PROPOFOL N/A 03/20/2014   RXTG:GYIRSWNIdistal esophagus short segment barrett's, bx with no dysplasia. next egd in 03/2017   ESOPHAGOGASTRODUODENOSCOPY (EGD) WITH PROPOFOL N/A 09/14/2015   Dr. RGala Romney Barrett's without dysplasia, gastritis benign bx, hiatal hernia. next EGD 09/2018.   ESOPHAGOGASTRODUODENOSCOPY (EGD) WITH PROPOFOL N/A 07/23/2019   Procedure: ESOPHAGOGASTRODUODENOSCOPY (EGD) WITH PROPOFOL;  Surgeon: RDaneil Dolin MD;  [facility];  Service: Endoscopy;  Laterality: N/A;  3:00pm   EYE SURGERY N/A    Phreesia 03/18/2020   HERNIA REPAIR Right 07/2010   Dr. BZada Girt  IR IMAGING GUIDED PORT INSERTION  02/09/2021   JOINT REPLACEMENT     [facility]  POLYPECTOMY  09/14/2015   Procedure: POLYPECTOMY;  Surgeon: RDaneil Dolin MD;  [facility];  Service: Endoscopy;;  ascending colon   right hip replacement  07/2010   went back in sept 2012 to fix   SHOULDER ARTHROSCOPY  2008   left   SPINE SURGERY N/A    Phreesia 03/18/2020   TOTAL HIP REVISION Right 12/17/2012   Procedure: RIGHT TOTAL HIP REVISION;  Surgeon: MMauri Pole MD;  [facility];  Service: Orthopedics;  Laterality: Right;   WRIST SURGERY Right 2011   open reduction right wrist.    Allergies:  Allergies  Allergen Reactions   Bee Venom Swelling and Hives   Acetaminophen     itches   Amlodipine     Hair loss   Red Dye Other (See Comments)   Tyloxapol    Other Itching, Rash and Swelling   Oxycodone-Acetaminophen Rash    Pt states, "this gives her a rash, but at home she takes oxycodone for pain relief" Pt states, "this gives her a rash, but at home she takes oxycodone for pain relief"    Family History:  Family History  Problem Relation Age of Onset   Hypertension Mother    Stroke Mother    Colon cancer Neg Hx    Anesthesia problems Neg Hx     Hypotension Neg Hx    Malignant hyperthermia Neg Hx    Pseudochol deficiency Neg Hx    Gastric cancer Neg Hx    Esophageal cancer Neg Hx     Social History:  Social History   Tobacco Use   Smoking status: Former    Packs/day: 0.25    Years: 25.00    Total pack years: 6.25    Types: Cigarettes    Quit date: 02/07/2003    Years since quitting: 18.7   Smokeless tobacco: Never   Tobacco comments:    quit in 2004  Vaping Use   Vaping Use: Never used  Substance Use Topics   Alcohol use: No   Drug use: No    Review of symptoms:  Constitutional:  Negative for unexplained weight loss, night sweats, fever, chills ENT:  Negative for nose bleeds, sinus pain, painful swallowing; + congestion CV:  Negative for chest pain, exercise intolerance, palpitations, loss of consciousness; + LE edema Resp:  Negative for wheezing; + cough GI:  Negative for nausea, vomiting, diarrhea, bloody stools; + heartburn GU:  Positives noted in HPI; otherwise negative for gross hematuria Neuro:  Negative for seizures, poor balance, limb weakness, slurred speech Psych:  Negative for lack of energy, anxiety; + depression Endocrine:  Negative for polydipsia, polyuria, symptoms of hypoglycemia (dizziness, hunger, sweating) Hematologic:  Negative for anemia, purpura, petechia, prolonged or excessive bleeding, use of anticoagulants  Allergic:  Negative for difficulty breathing or choking as a result of exposure to anything; no shellfish allergy; no allergic response (rash/itch) to materials, foods  Physical exam: BP (!) 74/38   Pulse 72   Ht '5\' 5"'$  (1.651 m)   Wt 128 lb (58.1 kg)   BMI 21.30 kg/m  GENERAL APPEARANCE:  Well appearing, well developed, well nourished, NAD HEENT: Atraumatic, Normocephalic, oropharynx clear. NECK: Supple without lymphadenopathy or thyromegaly. LUNGS: Clear to auscultation bilaterally. HEART: Regular Rate and Rhythm without murmurs, gallops, or rubs. ABDOMEN: Soft,  non-tender, No Masses. EXTREMITIES: Moves all extremities well.  Without clubbing, cyanosis, or edema. NEUROLOGIC:  Alert and oriented x 3, normal gait, CN II-XII grossly intact.  MENTAL STATUS:  Appropriate. BACK:  Non-tender to palpation.  No CVAT SKIN:  Warm, dry and intact.    Results: Patient unable to provide specimen  Bladder scan:  424 ml (patient was not able to void prior to study)

## 2021-10-29 NOTE — Progress Notes (Signed)
Patient presents today for one unit of PRBC.  Patient is in satisfactory condition with no new complaints voiced.  Patient is still very sleepy and tired today, but slightly more alert than last visit.  She was sent to the ED last visit with bradycardia.  Patient is very hypotensive today.  Tarri Abernethy, PA-C notified. We will hold Benadryl today due to hypotension. We will proceed with transfusion per provider orders.   Patient tolerated transfusion well with no complaints voiced.  Patient left ambulatory in stable condition.  Vital signs stable at discharge.  Follow up as scheduled.

## 2021-10-29 NOTE — Progress Notes (Signed)
post void residual 464m

## 2021-10-29 NOTE — Addendum Note (Signed)
Addended by: Tarri Abernethy on: 10/29/2021 01:53 PM   Modules accepted: Orders

## 2021-10-29 NOTE — Patient Instructions (Addendum)
[place]  Discharge Instructions:  Your blood pressure is low.  After discussing with your primary care doctor, we will temporarily stop your hydralazine.  You can follow-up with your primary care doctor for further adjustments of your blood pressure medication. It is VERY important that you do NOT take your temazepam, oxycodone, or muscle relaxant at the same time.  Taking all of these medications together can decrease your blood pressure and can result in serious medical problems or fatality.   - - - - - - - - - - - - - - - - - -  Thank you for choosing [facility] to provide your oncology and hematology care.  If you have a lab appointment with the [facility], please come in thru the Main Entrance and check in at the main information desk.  Wear comfortable clothing and clothing appropriate for easy access to any Portacath or PICC line.   We strive to give you quality time with your provider. You may need to reschedule your appointment if you arrive late (15 or more minutes).  Arriving late affects you and other patients whose appointments are after yours.  Also, if you miss three or more appointments without notifying the office, you may be dismissed from the clinic at the provider's discretion.      For prescription refill requests, have your pharmacy contact our office and allow 72 hours for refills to be completed.     To help prevent nausea and vomiting after your treatment, we encourage you to take your nausea medication as directed.  BELOW ARE SYMPTOMS THAT SHOULD BE REPORTED IMMEDIATELY: *FEVER GREATER THAN 100.4 F (38 C) OR HIGHER *CHILLS OR SWEATING *NAUSEA AND VOMITING THAT IS NOT CONTROLLED WITH YOUR NAUSEA MEDICATION *UNUSUAL SHORTNESS OF BREATH *UNUSUAL BRUISING OR BLEEDING *URINARY PROBLEMS (pain or burning when urinating, or frequent urination) *BOWEL PROBLEMS (unusual diarrhea, constipation, pain near the anus) TENDERNESS IN  MOUTH AND THROAT WITH OR WITHOUT PRESENCE OF ULCERS (sore throat, sores in mouth, or a toothache) UNUSUAL RASH, SWELLING OR PAIN  UNUSUAL VAGINAL DISCHARGE OR ITCHING   Items with * indicate a potential emergency and should be followed up as soon as possible or go to the Emergency Department if any problems should occur.  Please show the CHEMOTHERAPY ALERT CARD or IMMUNOTHERAPY ALERT CARD at check-in to the Emergency Department and triage nurse.  Should you have questions after your visit or need to cancel or reschedule your appointment, please contact [facility] [phone]  and follow the prompts.  Office hours are 8:00 a.m. to 4:30 p.m. Monday - Friday. Please note that voicemails left after 4:00 p.m. may not be returned until the following business day.  We are closed weekends and major holidays. You have access to a nurse at all times for urgent questions. Please call the main number to the clinic [phone] and follow the prompts.  For any non-urgent questions, you may also contact your provider using MyChart. We now offer e-Visits for anyone 72 and older to request care online for non-urgent symptoms. For details visit [email or web address].   Also download the MyChart app! Go to the app store, search "MyChart", open the app, select [facility], and log in with your MyChart username and password.  Masks are optional in the cancer centers. If you would like for your care team to wear a mask while they are taking care of you, please let them know. You may have  one support person who is at least 76 years old accompany you for your appointments.

## 2021-10-29 NOTE — Addendum Note (Signed)
Addended by: Audie Box on: 10/29/2021 10:08 AM   Modules accepted: Orders

## 2021-10-30 ENCOUNTER — Encounter: Payer: Self-pay | Admitting: Family Medicine

## 2021-10-30 LAB — TYPE AND SCREEN
ABO/RH(D): O POS
Antibody Screen: POSITIVE
DAT, IgG: NEGATIVE
Donor AG Type: NEGATIVE
Unit division: 0

## 2021-10-30 LAB — BPAM RBC
Blood Product Expiration Date: 202310122359
ISSUE DATE / TIME: 202309221049
Unit Type and Rh: 9500

## 2021-10-30 NOTE — Assessment & Plan Note (Signed)
Documentation in clinical settings of significant variation in blood pressure, making control a challenge

## 2021-10-30 NOTE — Progress Notes (Signed)
Virtual Visit via Telephone Note  I connected with Renee Waters on 10/30/21 at  4:40 PM EDT by telephone and verified that I am speaking with the correct person using two identifiers.  Location: Patient: home Provider: office   I discussed the limitations, risks, security and privacy concerns of performing an evaluation and management service by telephone and the availability of in person appointments. I also discussed with the patient that there may be a patient responsible charge related to this service. The patient expressed understanding and agreed to proceed.   History of Present Illness: Patient requesting tele visit one day post d/c from hospital with concern over blood pressure management in particular Over the years she has had labile hypertension, which has become increasingly problematic,    Observations/Objective:reports not ye filling her new BP med she was d/c home on, reporting a normal BP at home at the time of the call. Record review shows constantly elevated blood pressure readings, so I advised her that  she needs to fill and take the  new additional medication, I also feel it important to have Cardiology assistance with management , and she agrees She also c/o intermittent  episodes oh heart bating fast Of note hr UTI, though treated with appropriate antibiotic as reported pan sensitive, apparently did not clear with 1 week of treatment, so she is being referred to Urology for this Has c/o chronic cough, was tested for Covid at hospital, states mention was made of RSV testing which did not happen. Concerned about dx or untreated infection , CXr reports vascular congestion , whic I explain is the more likely cayuse of her cough . Will order the RSV test based on concerns Pulmonary consult also    Assessment and Plan:  Labile hypertension Documentation in clinical settings of significant variation in blood pressure, making control a challenge  Chronic cough Several  month h/o cough,minimal  Chronic interstitial prominence, , RSV titer, refer Pulmonary  Recurrent UTI Persitent/ recurrent UTI despite appropriate  Antibiotic, refer urology   Follow Up Instructions:    I discussed the assessment and treatment plan with the patient. The patient was provided an opportunity to ask questions and all were answered. The patient agreed with the plan and demonstrated an understanding of the instructions.   The patient was advised to call back or seek an in-person evaluation if the symptoms worsen or if the condition fails to improve as anticipated.  I provided 15 minutes of non-face-to-face time during this encounter.   Tula Nakayama, MD

## 2021-11-01 ENCOUNTER — Other Ambulatory Visit: Payer: Self-pay | Admitting: Family Medicine

## 2021-11-01 ENCOUNTER — Encounter: Payer: Self-pay | Admitting: Internal Medicine

## 2021-11-01 ENCOUNTER — Ambulatory Visit (INDEPENDENT_AMBULATORY_CARE_PROVIDER_SITE_OTHER): Payer: Medicare Other | Admitting: Internal Medicine

## 2021-11-01 DIAGNOSIS — I129 Hypertensive chronic kidney disease with stage 1 through stage 4 chronic kidney disease, or unspecified chronic kidney disease: Secondary | ICD-10-CM | POA: Diagnosis not present

## 2021-11-01 DIAGNOSIS — J9601 Acute respiratory failure with hypoxia: Secondary | ICD-10-CM | POA: Diagnosis not present

## 2021-11-01 DIAGNOSIS — R053 Chronic cough: Secondary | ICD-10-CM | POA: Insufficient documentation

## 2021-11-01 DIAGNOSIS — N183 Chronic kidney disease, stage 3 unspecified: Secondary | ICD-10-CM | POA: Diagnosis not present

## 2021-11-01 DIAGNOSIS — N179 Acute kidney failure, unspecified: Secondary | ICD-10-CM | POA: Diagnosis not present

## 2021-11-01 DIAGNOSIS — D509 Iron deficiency anemia, unspecified: Secondary | ICD-10-CM | POA: Diagnosis not present

## 2021-11-01 DIAGNOSIS — I959 Hypotension, unspecified: Secondary | ICD-10-CM | POA: Diagnosis not present

## 2021-11-01 DIAGNOSIS — N390 Urinary tract infection, site not specified: Secondary | ICD-10-CM | POA: Insufficient documentation

## 2021-11-01 MED ORDER — PANTOPRAZOLE SODIUM 40 MG PO TBEC: DELAYED_RELEASE_TABLET | ORAL | 2 refills | Status: DC

## 2021-11-01 MED ORDER — AMOXICILLIN-POT CLAVULANATE 875-125 MG PO TABS: 1.0000 | ORAL_TABLET | Freq: Two times a day (BID) | ORAL | 0 refills | Status: DC

## 2021-11-01 MED ORDER — PREDNISONE 10 MG PO TABS: ORAL_TABLET | ORAL | 0 refills | Status: DC

## 2021-11-01 MED ORDER — BUDESONIDE-FORMOTEROL FUMARATE 80-4.5 MCG/ACT IN AERO: INHALATION_SPRAY | RESPIRATORY_TRACT | 11 refills | Status: DC

## 2021-11-01 NOTE — Patient Instructions (Addendum)
Symbicort  80 Take 2 puffs first thing in am and then another 2 puffs about 12 hours later.    Work on inhaler technique:  relax and gently blow all the way out then take a nice smooth full deep breath back in, triggering the inhaler at same time you start breathing in.  Hold breath in for at least  5 seconds if you can. Blow out symbicort  thru nose. Rinse and gargle with water when done.  If mouth or throat bother you at all,  try brushing teeth/gums/tongue with arm and hammer toothpaste/ make a slurry and gargle and spit out.   Change protonix to 40 mg Take 30- 60 min before your first and last meals of the day    Prednisone 10 mg take  4 each am x 2 days,   2 each am x 2 days,  1 each am x 2 days and stop    Augmentin 875 mg take one pill twice daily  X 10 days - take at breakfast and supper with large glass of water.  It would help reduce the usual side effects (diarrhea and yeast infections) if you ate cultured yogurt at lunch.   For cough > mucinex DM 1200 every 12 hours and supplement with oxyIR  '5mg'$  up to 1-2 hours every 4 hours as needed   Please schedule a follow up office visit in 2 weeks, sooner if needed  with all medications /inhalers/ solutions in hand so we can verify exactly what you are taking. This includes all medications from all doctors and over the counters - ok to add on to Monday.

## 2021-11-01 NOTE — Assessment & Plan Note (Signed)
Persitent/ recurrent UTI despite appropriate  Antibiotic, refer urology

## 2021-11-01 NOTE — Assessment & Plan Note (Addendum)
Several month h/o cough,minimal  Chronic interstitial prominence, , RSV titer, refer Pulmonary

## 2021-11-01 NOTE — Progress Notes (Unsigned)
Renee Waters, female    DOB: December 19, 1945    MRN: [id number]   Brief patient profile:  27   yobf  quit smoking 2004 with dx of myelofibrosis referred to pulmonary clinic in [place]  11/01/2021 by Dr Moshe Cipro  for cough/sob since August 2023     History of Present Illness  11/01/2021  Pulmonary/ 1st office eval/ [name]  Chief Complaint  Patient presents with   Consult    Cough for several months. Urgent referral from Dr. Moshe Cipro PCP   "Myeloprolifertive malignancy with markedly increased WBC."    Dyspnea:  slower than others but can do [employer] walking = MMRC2 = can't walk a nl pace on a flat grade s sob but does fine slow and flat   Cough: lots of mucus in am greyand thick / assoc nasal congestion also worse in am  Sleep: 30 degrees  SABA use: advair x 1 week not helping, does not have saba  No obvious day to day or daytime pattern/variability or assoc excess/ purulent sputum or mucus plugs or hemoptysis or cp or chest tightness, subjective wheeze or overt sinus or hb symptoms.   Sleeping as above without nocturnal  exacerbation  of respiratory  c/o's or need for noct saba. Also denies any obvious fluctuation of symptoms with weather or environmental changes or other aggravating or alleviating factors except as outlined above   No unusual exposure hx or h/o childhood pna/ asthma or knowledge of premature birth.  Current Allergies, Complete Past Medical History, Past Surgical History, Family History, and Social History were reviewed in [employer] record.  ROS  The following are not active complaints unless bolded Hoarseness, sore throat, dysphagia, dental problems, itching, sneezing,  nasal congestion or discharge of excess mucus or purulent secretions, ear ache,   fever, chills, sweats, unintended wt loss or wt gain, classically pleuritic or exertional cp,  orthopnea pnd or arm/hand swelling  or leg swelling, presyncope, palpitations, abdominal  pain, anorexia, nausea, vomiting, diarrhea  or change in bowel habits or change in bladder habits, change in stools or change in urine, dysuria, hematuria,  rash, arthralgias, visual complaints, headache, numbness, weakness or ataxia or problems with walking or coordination,  change in mood or  memory.           Past Medical History:  Diagnosis Date   Acute cholangitis    Allergy    Anemia    Anxiety    Arthritis    [employer] 03/18/2020   Barrett's esophagus    Cataract    Chronic back pain    Chronic neck pain    CKD (chronic kidney disease) stage 3, GFR 30-59 ml/min ([place]) 10/29/2020   Depression    Genital herpes    GERD (gastroesophageal reflux disease)    H/O degenerative disc disease    History of blood transfusion    Hypertension    Insomnia    Lupus (systemic lupus erythematosus) ([place])    Neuromuscular disorder ([place])    Osteoarthritis    S/P colonoscopy June 2005   normal, no polyps   S/P endoscopy June 2005, Oct 2009   2005: short-segment Barrett's, 2009: short-segment Barrett's   Upper respiratory tract infection 07/09/2020   UTI (lower urinary tract infection) 11/2012    Outpatient Medications Prior to Visit - - NOTE:   Unable to verify as accurately reflecting what pt takes    Medication Sig Dispense Refill   acyclovir (ZOVIRAX) 800 MG tablet Take 1  tablet (800 mg total) by mouth 5 (five) times daily. 50 tablet 1   acyclovir ointment (ZOVIRAX) 5 % Apply 1 application. topically every 3 (three) hours. 15 g 1   albuterol (VENTOLIN HFA) 108 (90 Base) MCG/ACT inhaler Inhale 2 puffs into the lungs every 6 (six) hours as needed for wheezing or shortness of breath. 8 g 0   allopurinol (ZYLOPRIM) 300 MG tablet Take 1 tablet (300 mg total) by mouth daily. 30 tablet 3   citalopram (CELEXA) 20 MG tablet Take 1 tablet by mouth once daily 30 tablet 3   EPINEPHRINE 0.3 mg/0.3 mL IJ SOAJ injection INJECT 0.3 MLS INTO MUSCLE ONCE AS NEEDED (Patient taking differently: Inject 0.3 mg  into the muscle as needed for anaphylaxis.) 1 Device 2   furosemide (LASIX) 20 MG tablet Take 1 tablet (20 mg total) by mouth daily as needed. 30 tablet 2   gabapentin (NEURONTIN) 300 MG capsule Take one capsule by mouth once daily, as needed, for pain 90 capsule 1   guaiFENesin-codeine (ROBITUSSIN AC) 100-10 MG/5ML syrup Take 5 mLs by mouth 3 (three) times daily as needed for cough. 120 mL 0   lidocaine-prilocaine (EMLA) cream Apply 1 application. topically as needed. 30 g 3   losartan (COZAAR) 50 MG tablet Take 1 tablet (50 mg total) by mouth daily. 30 tablet 2   metoprolol tartrate (LOPRESSOR) 25 MG tablet Take 1 tablet (25 mg total) by mouth 2 (two) times daily. 90 tablet 1   mirtazapine (REMERON) 15 MG tablet Take 1 tablet (15 mg total) by mouth at bedtime. 30 tablet 5   oxyCODONE (OXY IR/ROXICODONE) 5 MG immediate release tablet Take 5 mg by mouth every 4 (four) hours as needed.     pantoprazole (PROTONIX) 40 MG tablet TAKE 1 TABLET BY MOUTH ONCE DAILY BEFORE BREAKFAST 90 tablet 0   potassium chloride (KLOR-CON) 10 MEQ tablet Take 1 tablet by mouth once daily 30 tablet 2   ruxolitinib phosphate (JAKAFI) 15 MG tablet Take 1 tablet (15 mg total) by mouth 2 (two) times daily. 60 tablet 3   temazepam (RESTORIL) 30 MG capsule Take 1 capsule (30 mg total) by mouth at bedtime as needed for sleep. 30 capsule 5   tiZANidine (ZANAFLEX) 4 MG tablet Take 2 tablets (8 mg total) by mouth 3 (three) times daily as needed (spasms). (Patient taking differently: Take 4-8 mg by mouth 3 (three) times daily as needed (spasms).) 180 tablet 0   trimethoprim (TRIMPEX) 100 MG tablet Take 1 tablet (100 mg total) by mouth daily. 30 tablet 2   sulfamethoxazole-trimethoprim (BACTRIM DS) 800-160 MG tablet       Facility-Administered Medications Prior to Visit  Medication Dose Route Frequency Provider Last Rate Last Admin   lanreotide acetate (SOMATULINE DEPOT) 120 MG/0.5ML injection            octreotide (SANDOSTATIN  LAR) 30 MG IM injection              Objective:     BP 136/78 (BP Location: Left Arm, Patient Position: Sitting)   Pulse 62   Temp 97.7 F (36.5 C) (Temporal)   Ht '5\' 5"'$  (1.651 m)   Wt 126 lb (57.2 kg)   SpO2 96% Comment: ra  BMI 20.97 kg/m   SpO2: 96 % (ra)  Amb bf /mildly congested sounding cough /rattle/ mild pseudowheeze   HEENT : Oropharynx  clear   Nasal turbinates mod nonspecific edema    NECK :  without  apparent JVD/  palpable Nodes/TM    LUNGS: no acc muscle use,  Min barrel  contour chest wall with coarse insp/exp rhonchi decreased bs s audible wheeze and  without cough on insp or exp maneuvers and min  Hyperresonant  to  percussion bilaterally    CV:  RRR  no s3 or murmur or increase in P2, and no edema   ABD:  soft and nontender with pos end  insp Hoover's  in the supine position.  No bruits or organomegaly appreciated   MS:  Nl gait/ ext warm without deformities Or obvious joint restrictions  calf tenderness, cyanosis or clubbing     SKIN: warm and dry without lesions    NEURO:  alert, approp, nl sensorium with  no motor or cerebellar deficits apparent.          I personally reviewed images and agree with radiology impression as follows:  CXR:   pa and lateral  10/19/21 Enlargement of cardiac silhouette with pulmonary vascular congestion.   Minimal chronic interstitial prominence without acute infiltrate   I personally reviewed images and agree with radiology impression as follows:   Chest CT 09/01/53 1. Bilateral trace pleural effusions. 2. Aortic Atherosclerosis (ICD10-I70.0) and Emphysema (ICD10-J43.9).     Assessment   Chronic cough Onset early August 2023  Assoc nasal congestion  - 11/01/2021 rx augmentin/ symb 80/ gerd rx   The most common causes of chronic cough in immunocompetent adults include the following: upper airway cough syndrome (UACS), previously referred to as postnasal drip syndrome (PNDS), which is caused by variety of  rhinosinus conditions; (2) asthma; (3) GERD; (4) chronic bronchitis from cigarette smoking or other inhaled environmental irritants; (5) nonasthmatic eosinophilic bronchitis; and (6) bronchiectasis.   These conditions, singly or in combination, have accounted for up to 94% of the causes of chronic cough in prospective studies.   Other conditions have constituted no >6% of the causes in prospective studies These have included bronchogenic carcinoma, chronic interstitial pneumonia, sarcoidosis, left ventricular failure, ACEI-induced cough, and aspiration from a condition associated with pharyngeal dysfunction.    Chronic cough is often simultaneously caused by more than one condition. A single cause has been found from 38 to 82% of the time, multiple causes from 18 to 62%. Multiple caused cough has been the result of three diseases up to 42% of the time.   I suspect multiple causes here and will rx all 3 majory causes:  1)  Underlying sinusitis : augmentin x 10 day   2) GERD :   ppi bid ac/ diet  3) AB symbicort 80 (instead of advair 115 if insurance will cover has slt advantage of lower infection risk in setting of MF) Take 2 puffs first thing in am and then another 2 puffs about 12 hours later.  - 11/01/2021  After extensive coaching inhaler device,  effectiveness =    75% from a baseline near 0  Concerns in Myelofibrosis included altered cellular response to low gread infection and raise the issue of refractory sinusitis/ bronchiectasis so may need f/u with sinus ct and HRCT if not responding to above approach.  F/u in 2 weeks to regroup          Each maintenance medication was reviewed in detail including emphasizing most importantly the difference between maintenance and prns and under what circumstances the prns are to be triggered using an action plan format where appropriate.  Total time for H and P, chart review, counseling, reviewing hfa device(s) and generating customized  AVS unique  to this office visit / same day charting = 64 min new pt eval               Christinia Gully, MD 11/01/2021

## 2021-11-02 ENCOUNTER — Encounter: Payer: Self-pay | Admitting: Internal Medicine

## 2021-11-02 LAB — GRAM STAIN W/SPUTUM CULT RFLX
Epithelial Cells: NONE SEEN
Result 1: NONE SEEN

## 2021-11-02 LAB — SPUTUM CULTURE

## 2021-11-02 NOTE — Assessment & Plan Note (Addendum)
Onset early August 2023  Assoc nasal congestion  - 11/01/2021 rx augmentin/ symb 80/ gerd rx   The most common causes of chronic cough in immunocompetent adults include the following: upper airway cough syndrome (UACS), previously referred to as postnasal drip syndrome (PNDS), which is caused by variety of rhinosinus conditions; (2) asthma; (3) GERD; (4) chronic bronchitis from cigarette smoking or other inhaled environmental irritants; (5) nonasthmatic eosinophilic bronchitis; and (6) bronchiectasis.   These conditions, singly or in combination, have accounted for up to 94% of the causes of chronic cough in prospective studies.   Other conditions have constituted no >6% of the causes in prospective studies These have included bronchogenic carcinoma, chronic interstitial pneumonia, sarcoidosis, left ventricular failure, ACEI-induced cough, and aspiration from a condition associated with pharyngeal dysfunction.    Chronic cough is often simultaneously caused by more than one condition. A single cause has been found from 38 to 82% of the time, multiple causes from 18 to 62%. Multiple caused cough has been the result of three diseases up to 42% of the time.   I suspect multiple causes here and will rx all 3 majory causes:  1)  Underlying sinusitis : augmentin x 10 day   2) GERD :   ppi bid ac/ diet  3) AB symbicort 80 (instead of advair 115 if insurance will cover has slt advantage of lower infection risk in setting of MF) Take 2 puffs first thing in am and then another 2 puffs about 12 hours later.  - 11/01/2021  After extensive coaching inhaler device,  effectiveness =    75% from a baseline near 0  Concerns in Myelofibrosis included altered cellular response to low gread infection and raise the issue of refractory sinusitis/ bronchiectasis so may need f/u with sinus ct and HRCT if not responding to above approach.  F/u in 2 weeks to regroup          Each maintenance medication was reviewed  in detail including emphasizing most importantly the difference between maintenance and prns and under what circumstances the prns are to be triggered using an action plan format where appropriate.  Total time for H and P, chart review, counseling, reviewing hfa device(s) and generating customized AVS unique to this office visit / same day charting = 64 min new pt eval

## 2021-11-04 ENCOUNTER — Encounter: Payer: Self-pay | Admitting: Family Medicine

## 2021-11-04 ENCOUNTER — Ambulatory Visit (INDEPENDENT_AMBULATORY_CARE_PROVIDER_SITE_OTHER): Payer: Medicare Other | Admitting: Family Medicine

## 2021-11-04 ENCOUNTER — Inpatient Hospital Stay: Payer: Medicare Other

## 2021-11-04 VITALS — BP 82/50 | HR 76 | Ht 65.0 in | Wt 125.0 lb

## 2021-11-04 DIAGNOSIS — D471 Chronic myeloproliferative disease: Secondary | ICD-10-CM

## 2021-11-04 DIAGNOSIS — R0989 Other specified symptoms and signs involving the circulatory and respiratory systems: Secondary | ICD-10-CM

## 2021-11-04 DIAGNOSIS — N390 Urinary tract infection, site not specified: Secondary | ICD-10-CM

## 2021-11-04 DIAGNOSIS — E876 Hypokalemia: Secondary | ICD-10-CM | POA: Diagnosis not present

## 2021-11-04 DIAGNOSIS — L299 Pruritus, unspecified: Secondary | ICD-10-CM | POA: Diagnosis not present

## 2021-11-04 DIAGNOSIS — N189 Chronic kidney disease, unspecified: Secondary | ICD-10-CM | POA: Diagnosis not present

## 2021-11-04 DIAGNOSIS — D72829 Elevated white blood cell count, unspecified: Secondary | ICD-10-CM

## 2021-11-04 DIAGNOSIS — F418 Other specified anxiety disorders: Secondary | ICD-10-CM | POA: Diagnosis not present

## 2021-11-04 DIAGNOSIS — F5105 Insomnia due to other mental disorder: Secondary | ICD-10-CM

## 2021-11-04 DIAGNOSIS — R52 Pain, unspecified: Secondary | ICD-10-CM

## 2021-11-04 DIAGNOSIS — Z09 Encounter for follow-up examination after completed treatment for conditions other than malignant neoplasm: Secondary | ICD-10-CM

## 2021-11-04 DIAGNOSIS — Z79899 Other long term (current) drug therapy: Secondary | ICD-10-CM | POA: Diagnosis not present

## 2021-11-04 LAB — CBC WITH DIFFERENTIAL/PLATELET
Abs Immature Granulocytes: 18.6 10*3/uL — ABNORMAL HIGH (ref 0.00–0.07)
Band Neutrophils: 5 %
Basophils Absolute: 0 10*3/uL (ref 0.0–0.1)
Basophils Relative: 0 %
Blasts: 1 %
Eosinophils Absolute: 9.3 10*3/uL — ABNORMAL HIGH (ref 0.0–0.5)
Eosinophils Relative: 6 %
HCT: 27 % — ABNORMAL LOW (ref 36.0–46.0)
Hemoglobin: 8.6 g/dL — ABNORMAL LOW (ref 12.0–15.0)
Lymphocytes Relative: 5 %
Lymphs Abs: 7.7 10*3/uL — ABNORMAL HIGH (ref 0.7–4.0)
MCH: 29.7 pg (ref 26.0–34.0)
MCHC: 31.9 g/dL (ref 30.0–36.0)
MCV: 93.1 fL (ref 80.0–100.0)
Metamyelocytes Relative: 4 %
Monocytes Absolute: 0 10*3/uL — ABNORMAL LOW (ref 0.1–1.0)
Monocytes Relative: 0 %
Myelocytes: 5 %
Neutro Abs: 117.7 10*3/uL — ABNORMAL HIGH (ref 1.7–7.7)
Neutrophils Relative %: 71 %
Platelets: 103 10*3/uL — ABNORMAL LOW (ref 150–400)
Promyelocytes Relative: 3 %
RBC: 2.9 MIL/uL — ABNORMAL LOW (ref 3.87–5.11)
RDW: 20.5 % — ABNORMAL HIGH (ref 11.5–15.5)
WBC: 154.9 10*3/uL (ref 4.0–10.5)
nRBC: 1.4 % — ABNORMAL HIGH (ref 0.0–0.2)

## 2021-11-04 LAB — SAMPLE TO BLOOD BANK

## 2021-11-04 MED ORDER — HEPARIN SOD (PORK) LOCK FLUSH 100 UNIT/ML IV SOLN
500.0000 [IU] | Freq: Once | INTRAVENOUS | Status: AC
  Administered 2021-11-04: 500 [IU] via INTRAVENOUS

## 2021-11-04 MED ORDER — SODIUM CHLORIDE 0.9% FLUSH
10.0000 mL | INTRAVENOUS | Status: DC | PRN
  Administered 2021-11-04: 10 mL via INTRAVENOUS

## 2021-11-04 NOTE — Patient Instructions (Signed)
F/u as before, call if you need me sooner  PLEASE STOP losartan , and  for blood pressure only take the metoprolol, your blood pressure is low, I know you medtiomed taking pain medication today which is likely lowering the blood pressure  Continue furosemide and potassium per Oncology  Thanks for choosing [facility], we consider it a privelige to serve you.

## 2021-11-04 NOTE — Progress Notes (Signed)
CRITICAL VALUE ALERT Critical value received:  WBC [id number] Date of notification:  11-04-21 Time of notification: 1326 Critical value read back:  Yes.   Nurse who received alert:  C. Fantashia Shupert RN MD notified time and response:  Dr. Raliegh Ip, [phone]

## 2021-11-04 NOTE — Progress Notes (Signed)
[name] presented for Portacath access and flush with labs . Portacath located right chest wall accessed with  H 20 needle.  Good blood return present. Portacath flushed with 10 ml NS and 500U/3m Heparin and needle removed intact. Good blood return and bruising or swelling noted at the site.  Procedure tolerated well and without incident.     Discharged from clinic ambulatory with cane in stable condition. Alert and oriented x 3. [facility]

## 2021-11-05 ENCOUNTER — Inpatient Hospital Stay: Payer: Medicare Other

## 2021-11-08 ENCOUNTER — Encounter: Payer: Self-pay | Admitting: Family Medicine

## 2021-11-08 NOTE — Assessment & Plan Note (Signed)
Patient in for follow up of recent hospitalization. Discharge summary, and laboratory and radiology data are reviewed, and any questions or concerns  are discussed. Specific issues requiring follow up are specifically addressed.  

## 2021-11-08 NOTE — Assessment & Plan Note (Signed)
Urology inolved in management

## 2021-11-08 NOTE — Assessment & Plan Note (Signed)
Followed / managed by Oncology, no additional transfusions since most recent hospitalization, however WBC coungt increasing

## 2021-11-08 NOTE — Assessment & Plan Note (Signed)
Continue current med 

## 2021-11-08 NOTE — Progress Notes (Signed)
   Celsa Nordahl     MRN: [id number]      DOB: 07/12/1945   HPI Ms. Gunderman is here for follow up since her last visit with me , she was hospitalized from 9/12 to /13, 2023 because of generalized weakness, and during her stay she required 1 unit of PRBC. She has also had Urology and Pulmonary evaluations as she had been referred at her las visit for persistent UTI and chronic cough. Currently on Augmentin , no c/o intolerance, in the interim sputum has scant growth of  very resistant bacteria, Pulmonary, and now the pt are aware Blood pressure at clinic today is very low, states she did take a pain pil which tends to lower the BP, does not feel light headed or weak.Adjustment of antihypertensive regime is made, and she has an upcoming appt with Cardiology because of labile hypertension which is not new and becoming more challenging  to manage Feels relatively well esp as she has just come from hematology clinic, and blood is "good" as in does not need transfusion, unfortunately her WBC is  the highest I have seen it  ROS Denies recent fever or chills. Denies sinus pressure, nasal congestion, ear pain or sore throat.  Denies chest pains, palpitations and leg swelling Denies abdominal pain, nausea, vomiting,diarrhea or constipation.   Chronic  joint pain, and limitation in mobility. Denies headaches, seizures, numbness, or tingling. Denies uncontrolled depression, anxiety or insomnia. Denies skin break down or rash.   PE  BP (!) 82/50   Pulse 76   Ht '5\' 5"'$  (1.651 m)   Wt 125 lb (56.7 kg)   SpO2 92%   BMI 20.80 kg/m   Patient alert and oriented  HEENT: No facial asymmetry, EOMI,     Neck supple .  Chest: Clear to auscultation bilaterally.Decreased air entry  CVS: S1, S2 no murmurs, no S3.Regular rate.  ABD: Soft non tender.   Ext: No edema  MS: decreased  ROM spine, shoulders, hips and knees.  Skin: Intact, no ulcerations or rash noted.  Psych: Good eye contact, normal  affect.  CNS: CN 2-12 intact, power,  normal throughout.no focal deficits noted.   [facility] discharge follow-up Patient in for follow up of recent hospitalization. Discharge summary, and laboratory and radiology data are reviewed, and any questions or concerns  are discussed. Specific issues requiring follow up are specifically addressed.   Labile hypertension Presents hypotensive at [facility], losartan stopped, close  F/u with Cardiology to establish care and management of her blood pressure  Insomnia secondary to depression with anxiety Continue current med  Generalized pain Pain management through pain clinic  Frequent UTI Urology inolved in management   MPN (myeloproliferative neoplasm) ([place]) Followed / managed by Oncology, no additional transfusions since most recent hospitalization, however WBC coungt increasing

## 2021-11-08 NOTE — Assessment & Plan Note (Signed)
Pain management through pain clinic

## 2021-11-08 NOTE — Assessment & Plan Note (Signed)
Presents hypotensive at [facility], losartan stopped, close  F/u with Cardiology to establish care and management of her blood pressure

## 2021-11-09 ENCOUNTER — Telehealth: Payer: Self-pay | Admitting: Family Medicine

## 2021-11-09 NOTE — Telephone Encounter (Signed)
I spoke with pt , states for 1 week she has had loose stool, I believe it may be from the augmentin, I advised her to stop the antibiotic, and to reach out to [facility] about this, I also am going to forwad this call to Dr [name]. Needs C dif stool test., and I will ask Nurse to order , States able to keep down  liquid  and solid food, no N/V, denies light headedness I asked if hospitalization was indicated at her Cardiology appt tomorrow,  if she would be agreeable to hospitalization in [place], states that is too far for spouse, wants to go to [facility] , states [facility] did not do her right the last time, and does not want to be admitted there.  Very tearful and stressed with her overall health, which is understandable Katie pls order Stool C dif test

## 2021-11-09 NOTE — Telephone Encounter (Signed)
Pt called stating she now has diarrhea & has had it everyday since she was given cough medication at her last visit. Wants a nurse to give her a call.

## 2021-11-09 NOTE — Progress Notes (Unsigned)
Cardiology Office Note:    Date:  11/10/2021   ID:  Renee Waters, DOB 08-Jun-1945, MRN [id number]  PCP:  [name], MD   [place] Prov[facility] Cardiologist:  Janina Mayo, MD     Referring MD: [name], MD   Chief Complaint  Patient presents with   New Patient (Initial Visit)  Hypotension  History of Present Illness:    Renee Waters is a 76 y.o. female with a hx of anxiety, CKD stage 3b, lupus, referral for labile blood pressures and possible ????HFpeF  She was sent to the hospital from oncology clinic 2/2 weakness 10/19/2021. She had ?UTI and was on antibiotics. At this time WBC 115, Hgb 6.7 mg/dL, plt 108. Cxray showed some congestion. She was managed with IV ceftriaxone.  She was diagnosed with dehydration. She had some bradycardia and the thought was that this was contributing to her weakness. She had sinus bradycardia in the 40s. Her daughter notes that she had to ask for tests to be done based on symptoms of productive cough. She had sputum cultures sent. They came back 10/26/2021. She had a sputum culture that was + pseudomonas 10/26/2021 (see sensitivities). She was managed on augmentin. Her blood pressures last month got as low as the 40J systolics. She was seen in clinic with her PCP in late September and WbC 154.  Hgb 8.6, platelets 103, abs neutrophil count were elevated form prior. She had diarrhea and augment was stopped.She was referred to cardiology with concern for labile Bps.   Today, she reports persistent diarrhea and thick yellow sputum/ she says it looks like over cooked oatmeal. No hemoptysis. No fevers. No chills. No dysuria. Last night she had a hard time breathing. She doesn't want to go to the hospital.    She has no LE edema. Notes SOB/chest tightness with productive cough and URI symptoms.  Past Medical History:  Diagnosis Date   Acute cholangitis    Allergy    Anemia    Anxiety    Arthritis    Phreesia 03/18/2020    Barrett's esophagus    Cataract    Chronic back pain    Chronic neck pain    CKD (chronic kidney disease) stage 3, GFR 30-59 ml/min ([place]) 10/29/2020   Depression    Genital herpes    GERD (gastroesophageal reflux disease)    H/O degenerative disc disease    History of blood transfusion    Hypertension    Insomnia    Lupus (systemic lupus erythematosus) ([place])    Neuromuscular disorder ([place])    Osteoarthritis    S/P colonoscopy June 2005   normal, no polyps   S/P endoscopy June 2005, Oct 2009   2005: short-segment Barrett's, 2009: short-segment Barrett's   Upper respiratory tract infection 07/09/2020   UTI (lower urinary tract infection) 11/2012    Past Surgical History:  Procedure Laterality Date   ABDOMINAL HYSTERECTOMY     BACK SURGERY     BIOPSY N/A 03/20/2014   Procedure: BIOPSY;  Surgeon: Daneil Dolin, MD;  Location: [facility];  Service: Endoscopy;  Laterality: N/A;   BIOPSY  09/14/2015   Procedure: BIOPSY;  Surgeon: Daneil Dolin, MD;  Location[facility];  Service: Endoscopy;;  esophageal and gastric   BIOPSY  07/23/2019   Procedure: BIOPSY;  Surgeon: Daneil Dolin, MD;  Location[facility];  Service: Endoscopy;;  esophageal    CARPAL TUNNEL RELEASE Left 2013   cervical disectomy  2002   CESAREAN SECTION N/A    Phreesia 03/18/2020   CHOLECYSTECTOMY     with lysis of adhesions for sbo; "ruptured gallbladder".   COLONOSCOPY  11/09/2011   RMR: Melanosis coli   COLONOSCOPY WITH PROPOFOL N/A 09/14/2015   Dr. Gala Romney: diverticulosis, 41m TA removed. next TCS 09/2020.    COLONOSCOPY WITH PROPOFOL N/A 04/30/2020   Procedure: COLONOSCOPY WITH PROPOFOL;  Surgeon: RDaneil Dolin MD;  [facility];  Service: Endoscopy;  Laterality: N/A;  PM (ASA 3)   DENTAL SURGERY  11/2015   multiple tooth extraction   ESOPHAGOGASTRODUODENOSCOPY  11/29/2007   salmon-colored  tongue   longest stable at  3 cm, distal esophagus as described previously status post biopsy/ Hiatal  hernia, otherwise normal stomach D1 and D2   ESOPHAGOGASTRODUODENOSCOPY  01/06/11   short segment Barrett's esophagus s/p bx/Hiatal hernia   ESOPHAGOGASTRODUODENOSCOPY (EGD) WITH PROPOFOL N/A 03/20/2014   RDJM:EQASTMHDdistal esophagus short segment barrett's, bx with no dysplasia. next egd in 03/2017   ESOPHAGOGASTRODUODENOSCOPY (EGD) WITH PROPOFOL N/A 09/14/2015   Dr. RGala Romney Barrett's without dysplasia, gastritis benign bx, hiatal hernia. next EGD 09/2018.   ESOPHAGOGASTRODUODENOSCOPY (EGD) WITH PROPOFOL N/A 07/23/2019   Procedure: ESOPHAGOGASTRODUODENOSCOPY (EGD) WITH PROPOFOL;  Surgeon: RDaneil Dolin MD;  [facility];  Service: Endoscopy;  Laterality: N/A;  3:00pm   EYE SURGERY N/A    Phreesia 03/18/2020   HERNIA REPAIR Right 07/2010   Dr. BZada Girt  IR IMAGING GUIDED PORT INSERTION  02/09/2021   JOINT REPLACEMENT     [facility]  POLYPECTOMY  09/14/2015   Procedure: POLYPECTOMY;  Surgeon: RDaneil Dolin MD;  Location: [facility];  Service: Endoscopy;;  ascending colon   right hip replacement  07/2010   went back in sept 2012 to fix   SHOULDER ARTHROSCOPY  2008   left   SPINE SURGERY N/A    Phreesia 03/18/2020   TOTAL HIP REVISION Right 12/17/2012   Procedure: RIGHT TOTAL HIP REVISION;  Surgeon: MMauri Pole MD;  Location: [facility];  Service: Orthopedics;  Laterality: Right;   WRIST SURGERY Right 2011   open reduction right wrist.    Current Medications: Current Meds  Medication Sig   acyclovir (ZOVIRAX) 800 MG tablet Take 1 tablet (800 mg total) by mouth 5 (five) times daily.   acyclovir ointment (ZOVIRAX) 5 % Apply 1 application. topically every 3 (three) hours.   albuterol (VENTOLIN HFA) 108 (90 Base) MCG/ACT inhaler Inhale 2 puffs into the lungs every 6 (six) hours as needed for wheezing or shortness of breath.   allopurinol (ZYLOPRIM) 300 MG tablet Take 1 tablet (300 mg total) by mouth daily.   budesonide-formoterol  (SYMBICORT) 80-4.5 MCG/ACT inhaler Take 2 puffs first thing in am and then another 2 puffs about 12 hours later.   citalopram (CELEXA) 20 MG tablet Take 1 tablet by mouth once daily   EPINEPHRINE 0.3 mg/0.3 mL IJ SOAJ injection INJECT 0.3 MLS INTO MUSCLE ONCE AS NEEDED (Patient taking differently: Inject 0.3 mg into the muscle as needed for anaphylaxis.)   furosemide (LASIX) 20 MG tablet Take 1 tablet (20 mg total) by mouth daily as needed.   gabapentin (NEURONTIN) 300 MG capsule Take one capsule by mouth once daily, as needed, for pain   lidocaine-prilocaine (EMLA) cream Apply 1 application. topically as needed.   metoprolol tartrate (LOPRESSOR) 25 MG tablet Take 1 tablet (25 mg total) by mouth 2 (two) times  daily.   mirtazapine (REMERON) 15 MG tablet Take 1 tablet (15 mg total) by mouth at bedtime.   oxyCODONE (OXY IR/ROXICODONE) 5 MG immediate release tablet Take 5 mg by mouth every 4 (four) hours as needed.   pantoprazole (PROTONIX) 40 MG tablet Take 30- 60 min before your first and last meals of the day   potassium chloride (KLOR-CON) 10 MEQ tablet Take 1 tablet by mouth once daily   ruxolitinib phosphate (JAKAFI) 15 MG tablet Take 1 tablet (15 mg total) by mouth 2 (two) times daily.   temazepam (RESTORIL) 30 MG capsule Take 1 capsule (30 mg total) by mouth at bedtime as needed for sleep.   tiZANidine (ZANAFLEX) 4 MG tablet Take 2 tablets (8 mg total) by mouth 3 (three) times daily as needed (spasms). (Patient taking differently: Take 4-8 mg by mouth 3 (three) times daily as needed (spasms).)     Allergies:   Bee venom, Acetaminophen, Amlodipine, Red dye, Tyloxapol, Other, and Oxycodone-acetaminophen   Social History   Socioeconomic History   Marital status: Married    Spouse name: louis   Number of children: 4   Years of education: 12+   Highest education level: Some college, no degree  Occupational History   Occupation: Press photographer  - retired   Occupation: non profit  Tobacco Use    Smoking status: Former    Packs/day: 0.25    Years: 25.00    Total pack years: 6.25    Types: Cigarettes    Quit date: 02/07/2003    Years since quitting: 18.7   Smokeless tobacco: Never   Tobacco comments:    quit in 2004  Vaping Use   Vaping Use: Never used  Substance and Sexual Activity   Alcohol use: No   Drug use: No   Sexual activity: Not Currently    Birth control/protection: Surgical  Other Topics Concern   Not on file  Social History Narrative   Not on file   Social Determinants of Health   Financial Resource Strain: Low Risk  (12/28/2020)   Overall Financial Resource Strain (CARDIA)    Difficulty of Paying Living Expenses: Not hard at all  Food Insecurity: No Food Insecurity (10/20/2021)   Hunger Vital Sign    Worried About Running Out of Food in the Last Year: Never true    [place] in the Last Year: Never true  Transportation Needs: No Transportation Needs (10/19/2021)   PRAPARE - Hydrologist (Medical): No    Lack of Transportation (Non-Medical): No  Physical Activity: Inactive (04/23/2021)   Exercise Vital Sign    Days of Exercise per Week: 0 days    Minutes of Exercise per Session: 0 min  Stress: Stress Concern Present (04/23/2021)   [place]    Feeling of Stress : Rather much  Social Connections: Moderately Integrated (12/28/2020)   Social Connection and Isolation Panel [NHANES]    Frequency of Communication with Friends and Family: More than three times a week    Frequency of Social Gatherings with Friends and Family: Once a week    Attends Religious Services: More than 4 times per year    Active Member of [employer] or Organizations: No    Attends Archivist Meetings: Never    Marital Status: Married     Family History: The patient's family history includes Hypertension in her mother; Stroke in her mother. There is no history of  Colon  cancer, Anesthesia problems, Hypotension, Malignant hyperthermia, Pseudochol deficiency, Gastric cancer, or Esophageal cancer.  ROS:   Please see the history of present illness.     All other systems reviewed and are negative.  EKGs/Labs/Other Studies Reviewed:    The following studies were reviewed today:   EKG:  EKG is  ordered today.  The ekg ordered today demonstrates   11/10/2021- sinus bradycardia HR 57  Recent Labs: 09/01/2021: TSH 1.158 09/02/2021: B Natriuretic Peptide 569.0 10/19/2021: Magnesium 2.2 10/26/2021: ALT 25; BUN 28; Creatinine, Ser 2.21; Potassium 4.4; Sodium 138 11/04/2021: Hemoglobin 8.6; Platelets 103   Recent Lipid Panel    Component Value Date/Time   CHOL 95 (L) 10/05/2020 0959   TRIG 87 10/05/2020 0959   HDL 27 (L) 10/05/2020 0959   CHOLHDL 3.5 10/05/2020 0959   CHOLHDL 3.0 11/03/2014 1003   VLDL 12 11/03/2014 1003   LDLCALC 51 10/05/2020 0959     Risk Assessment/Calculations:     Physical Exam:    VS:  Vitals:   11/10/21 1145  BP: 114/62  Pulse: (!) 57  SpO2: 97%     Wt Readings from Last 3 Encounters:  11/10/21 126 lb 9.6 oz (57.4 kg)  11/04/21 125 lb (56.7 kg)  11/04/21 126 lb 3.2 oz (57.2 kg)     GEN:  Well nourished, well developed in no acute distress HEENT: Normal NECK: No JVD; No carotid bruits LYMPHATICS: No lymphadenopathy CARDIAC: RRR, no murmurs, rubs, gallops RESPIRATORY:  Clear to auscultation without rales, wheezing or rhonchi  ABDOMEN: Soft, non-tender, non-distended MUSCULOSKELETAL:  No edema; No deformity  SKIN: Warm and dry NEUROLOGIC:  Alert and oriented x 3 PSYCHIATRIC:  Normal affect   ASSESSMENT:    Hypotension: c/f PNA. She is coughing up productive thick yellow sputum.She grew pseudomonas species  and with MPL/age/frailty is immunosuppressed. Augmentin caused profound diarrhea and it is not sensitive to augmentin. She is stable and lungs sound good, she does not have sepsis. However, she can benefit  from IV antibiotics with persistent symptoms.   ?HFpEF: no signs of CHF.  Sinus bradycardia: HR improved. No AV block.No indication for PPM  PLAN:    In order of problems listed above:  Recommended  she go to the ED, admission to medicine for management of pseudomonas PNA with IV antibiotics initially until she has symptom improvement            Medication Adjustments/Labs and Tests Ordered: Current medicines are reviewed at length with the patient today.  Concerns regarding medicines are outlined above.  No orders of the defined types were placed in this encounter.  No orders of the defined types were placed in this encounter.   Patient Instructions  Medication Instructions:  No Changes In Medications at this time.  *If you need a refill on your cardiac medications before your next appointment, please call your pharmacy*  Lab Work: None Ordered At This Time.  If you have labs (blood work) drawn today and your tests are completely normal, you will receive your results only by: [place] (if you have MyChart) OR A paper copy in the mail If you have any lab test that is abnormal or we need to change your treatment, we will call you to review the results.  Testing/Procedures: None Ordered At This Time.   Follow-Up: At [facility], you and your health needs are our priority.  As part of our continuing mission to provide you with exceptional heart care, we have created  designated Provider Care Teams.  These Care Teams include your primary Cardiologist (physician) and Advanced Practice Providers (APPs -  Physician Assistants and Nurse Practitioners) who all work together to provide you with the care you need, when you need it.  Your next appointment:   3 MONTHS  The format for your next appointment:   In Person  Provider:   Janina Mayo, MD     Other Instructions PLEASE REPORT TO THE EMERGENCY ROOM AT [facility]            Signed, Janina Mayo, MD  11/10/2021 12:31 PM    [place]

## 2021-11-10 ENCOUNTER — Encounter: Payer: Self-pay | Admitting: Internal Medicine

## 2021-11-10 ENCOUNTER — Emergency Department (HOSPITAL_COMMUNITY): Payer: Medicare Other

## 2021-11-10 ENCOUNTER — Encounter (HOSPITAL_COMMUNITY): Payer: Self-pay | Admitting: Emergency Medicine

## 2021-11-10 ENCOUNTER — Other Ambulatory Visit: Payer: Self-pay

## 2021-11-10 ENCOUNTER — Inpatient Hospital Stay (HOSPITAL_COMMUNITY)
Admission: EM | Admit: 2021-11-10 | Discharge: 2021-11-13 | DRG: 194 | Disposition: A | Discharge status: Home Health | Payer: Medicare Other | Attending: Internal Medicine | Admitting: Internal Medicine

## 2021-11-10 ENCOUNTER — Ambulatory Visit (INDEPENDENT_AMBULATORY_CARE_PROVIDER_SITE_OTHER): Payer: Medicare Other | Admitting: Internal Medicine

## 2021-11-10 ENCOUNTER — Telehealth: Payer: Self-pay | Admitting: Family Medicine

## 2021-11-10 VITALS — BP 114/62 | HR 57 | Ht 65.0 in | Wt 126.6 lb

## 2021-11-10 DIAGNOSIS — F5105 Insomnia due to other mental disorder: Secondary | ICD-10-CM | POA: Diagnosis not present

## 2021-11-10 DIAGNOSIS — N1832 Chronic kidney disease, stage 3b: Secondary | ICD-10-CM | POA: Diagnosis present

## 2021-11-10 DIAGNOSIS — Z8603 Personal history of neoplasm of uncertain behavior: Secondary | ICD-10-CM | POA: Diagnosis not present

## 2021-11-10 DIAGNOSIS — D471 Chronic myeloproliferative disease: Secondary | ICD-10-CM | POA: Diagnosis not present

## 2021-11-10 DIAGNOSIS — N183 Chronic kidney disease, stage 3 unspecified: Secondary | ICD-10-CM | POA: Diagnosis present

## 2021-11-10 DIAGNOSIS — I959 Hypotension, unspecified: Secondary | ICD-10-CM

## 2021-11-10 DIAGNOSIS — J189 Pneumonia, unspecified organism: Secondary | ICD-10-CM | POA: Diagnosis present

## 2021-11-10 DIAGNOSIS — R001 Bradycardia, unspecified: Secondary | ICD-10-CM | POA: Diagnosis present

## 2021-11-10 DIAGNOSIS — Z8249 Family history of ischemic heart disease and other diseases of the circulatory system: Secondary | ICD-10-CM

## 2021-11-10 DIAGNOSIS — K219 Gastro-esophageal reflux disease without esophagitis: Secondary | ICD-10-CM | POA: Diagnosis present

## 2021-11-10 DIAGNOSIS — D696 Thrombocytopenia, unspecified: Secondary | ICD-10-CM | POA: Diagnosis present

## 2021-11-10 DIAGNOSIS — R059 Cough, unspecified: Principal | ICD-10-CM

## 2021-11-10 DIAGNOSIS — R0603 Acute respiratory distress: Secondary | ICD-10-CM | POA: Diagnosis present

## 2021-11-10 DIAGNOSIS — I129 Hypertensive chronic kidney disease with stage 1 through stage 4 chronic kidney disease, or unspecified chronic kidney disease: Secondary | ICD-10-CM | POA: Diagnosis present

## 2021-11-10 DIAGNOSIS — J439 Emphysema, unspecified: Secondary | ICD-10-CM | POA: Diagnosis present

## 2021-11-10 DIAGNOSIS — F32A Depression, unspecified: Secondary | ICD-10-CM | POA: Diagnosis present

## 2021-11-10 DIAGNOSIS — D649 Anemia, unspecified: Secondary | ICD-10-CM | POA: Diagnosis not present

## 2021-11-10 DIAGNOSIS — R54 Age-related physical debility: Secondary | ICD-10-CM | POA: Diagnosis present

## 2021-11-10 DIAGNOSIS — R0989 Other specified symptoms and signs involving the circulatory and respiratory systems: Secondary | ICD-10-CM | POA: Diagnosis present

## 2021-11-10 DIAGNOSIS — Z888 Allergy status to other drugs, medicaments and biological substances status: Secondary | ICD-10-CM

## 2021-11-10 DIAGNOSIS — J151 Pneumonia due to Pseudomonas: Secondary | ICD-10-CM | POA: Diagnosis not present

## 2021-11-10 DIAGNOSIS — M329 Systemic lupus erythematosus, unspecified: Secondary | ICD-10-CM | POA: Diagnosis not present

## 2021-11-10 DIAGNOSIS — Z87891 Personal history of nicotine dependence: Secondary | ICD-10-CM | POA: Diagnosis not present

## 2021-11-10 DIAGNOSIS — J471 Bronchiectasis with (acute) exacerbation: Secondary | ICD-10-CM | POA: Diagnosis present

## 2021-11-10 DIAGNOSIS — Z79899 Other long term (current) drug therapy: Secondary | ICD-10-CM | POA: Diagnosis not present

## 2021-11-10 DIAGNOSIS — J180 Bronchopneumonia, unspecified organism: Secondary | ICD-10-CM | POA: Diagnosis present

## 2021-11-10 DIAGNOSIS — J470 Bronchiectasis with acute lower respiratory infection: Secondary | ICD-10-CM | POA: Diagnosis present

## 2021-11-10 DIAGNOSIS — F324 Major depressive disorder, single episode, in partial remission: Secondary | ICD-10-CM | POA: Diagnosis present

## 2021-11-10 DIAGNOSIS — D849 Immunodeficiency, unspecified: Secondary | ICD-10-CM | POA: Diagnosis present

## 2021-11-10 DIAGNOSIS — J479 Bronchiectasis, uncomplicated: Secondary | ICD-10-CM | POA: Diagnosis not present

## 2021-11-10 DIAGNOSIS — E860 Dehydration: Secondary | ICD-10-CM | POA: Diagnosis present

## 2021-11-10 DIAGNOSIS — Z96641 Presence of right artificial hip joint: Secondary | ICD-10-CM | POA: Diagnosis present

## 2021-11-10 DIAGNOSIS — Z90710 Acquired absence of both cervix and uterus: Secondary | ICD-10-CM

## 2021-11-10 DIAGNOSIS — D630 Anemia in neoplastic disease: Secondary | ICD-10-CM | POA: Diagnosis present

## 2021-11-10 DIAGNOSIS — I700 Atherosclerosis of aorta: Secondary | ICD-10-CM | POA: Diagnosis not present

## 2021-11-10 DIAGNOSIS — F419 Anxiety disorder, unspecified: Secondary | ICD-10-CM | POA: Diagnosis present

## 2021-11-10 DIAGNOSIS — N179 Acute kidney failure, unspecified: Secondary | ICD-10-CM | POA: Diagnosis not present

## 2021-11-10 DIAGNOSIS — M5440 Lumbago with sciatica, unspecified side: Secondary | ICD-10-CM | POA: Diagnosis present

## 2021-11-10 DIAGNOSIS — D631 Anemia in chronic kidney disease: Secondary | ICD-10-CM | POA: Diagnosis present

## 2021-11-10 DIAGNOSIS — Z823 Family history of stroke: Secondary | ICD-10-CM

## 2021-11-10 DIAGNOSIS — G4700 Insomnia, unspecified: Secondary | ICD-10-CM | POA: Diagnosis present

## 2021-11-10 DIAGNOSIS — G8929 Other chronic pain: Secondary | ICD-10-CM | POA: Diagnosis not present

## 2021-11-10 DIAGNOSIS — Z8616 Personal history of COVID-19: Secondary | ICD-10-CM | POA: Diagnosis not present

## 2021-11-10 DIAGNOSIS — Z885 Allergy status to narcotic agent status: Secondary | ICD-10-CM

## 2021-11-10 DIAGNOSIS — Z91030 Bee allergy status: Secondary | ICD-10-CM

## 2021-11-10 DIAGNOSIS — F418 Other specified anxiety disorders: Secondary | ICD-10-CM | POA: Diagnosis present

## 2021-11-10 DIAGNOSIS — Z7951 Long term (current) use of inhaled steroids: Secondary | ICD-10-CM

## 2021-11-10 DIAGNOSIS — Z9049 Acquired absence of other specified parts of digestive tract: Secondary | ICD-10-CM

## 2021-11-10 LAB — COMPREHENSIVE METABOLIC PANEL
ALT: 25 U/L (ref 0–44)
AST: 37 U/L (ref 15–41)
Albumin: 3.7 g/dL (ref 3.5–5.0)
Alkaline Phosphatase: 160 U/L — ABNORMAL HIGH (ref 38–126)
Anion gap: 9 (ref 5–15)
BUN: 23 mg/dL (ref 8–23)
CO2: 23 mmol/L (ref 22–32)
Calcium: 8.6 mg/dL — ABNORMAL LOW (ref 8.9–10.3)
Chloride: 109 mmol/L (ref 98–111)
Creatinine, Ser: 1.18 mg/dL — ABNORMAL HIGH (ref 0.44–1.00)
GFR, Estimated: 48 mL/min — ABNORMAL LOW (ref >60)
Glucose, Bld: 118 mg/dL — ABNORMAL HIGH (ref 70–99)
Potassium: 3.9 mmol/L (ref 3.5–5.1)
Sodium: 141 mmol/L (ref 135–145)
Total Bilirubin: 0.6 mg/dL (ref 0.3–1.2)
Total Protein: 7.3 g/dL (ref 6.5–8.1)

## 2021-11-10 LAB — CBC WITH DIFFERENTIAL/PLATELET
Abs Immature Granulocytes: 31 10*3/uL — ABNORMAL HIGH (ref 0.00–0.07)
Band Neutrophils: 22 %
Basophils Absolute: 0 10*3/uL (ref 0.0–0.1)
Basophils Relative: 0 %
Eosinophils Absolute: 5 10*3/uL — ABNORMAL HIGH (ref 0.0–0.5)
Eosinophils Relative: 4 %
HCT: 24.9 % — ABNORMAL LOW (ref 36.0–46.0)
Hemoglobin: 8.2 g/dL — ABNORMAL LOW (ref 12.0–15.0)
Lymphocytes Relative: 1 %
Lymphs Abs: 1.2 10*3/uL (ref 0.7–4.0)
MCH: 30.4 pg (ref 26.0–34.0)
MCHC: 32.9 g/dL (ref 30.0–36.0)
MCV: 92.2 fL (ref 80.0–100.0)
Metamyelocytes Relative: 12 %
Monocytes Absolute: 7.4 10*3/uL — ABNORMAL HIGH (ref 0.1–1.0)
Monocytes Relative: 6 %
Myelocytes: 12 %
Neutro Abs: 78.1 10*3/uL — ABNORMAL HIGH (ref 1.7–7.7)
Neutrophils Relative %: 41 %
Other: 1 %
Platelets: 77 10*3/uL — ABNORMAL LOW (ref 150–400)
Promyelocytes Relative: 1 %
RBC: 2.7 MIL/uL — ABNORMAL LOW (ref 3.87–5.11)
RDW: 20.2 % — ABNORMAL HIGH (ref 11.5–15.5)
Smear Review: DECREASED
WBC: 123.9 10*3/uL (ref 4.0–10.5)
nRBC: 2.4 % — ABNORMAL HIGH (ref 0.0–0.2)

## 2021-11-10 LAB — RESP PANEL BY RT-PCR (FLU A&B, COVID) ARPGX2
Influenza A by PCR: NEGATIVE
Influenza B by PCR: NEGATIVE
SARS Coronavirus 2 by RT PCR: NEGATIVE

## 2021-11-10 LAB — LACTIC ACID, PLASMA
Lactic Acid, Venous: 1.6 mmol/L (ref 0.5–1.9)
Lactic Acid, Venous: 1 mmol/L (ref 0.5–1.9)

## 2021-11-10 LAB — BRAIN NATRIURETIC PEPTIDE: B Natriuretic Peptide: 168.7 pg/mL — ABNORMAL HIGH (ref 0.0–100.0)

## 2021-11-10 MED ORDER — EPINEPHRINE 0.3 MG/0.3ML IJ SOAJ: 0.3000 mg | INTRAMUSCULAR | Status: DC | PRN

## 2021-11-10 MED ORDER — OXYCODONE HCL 5 MG PO TABS
5.0000 mg | ORAL_TABLET | Freq: Once | ORAL | Status: AC
  Administered 2021-11-10: 5 mg via ORAL
  Filled 2021-11-10: qty 1

## 2021-11-10 MED ORDER — SODIUM CHLORIDE 0.9 % IV SOLN
1.0000 g | Freq: Once | INTRAVENOUS | Status: AC
  Administered 2021-11-10: 1 g via INTRAVENOUS
  Filled 2021-11-10: qty 20

## 2021-11-10 MED ORDER — SODIUM CHLORIDE 0.9 % IV SOLN: Freq: Once | INTRAVENOUS | Status: AC

## 2021-11-10 MED ORDER — SULFAMETHOXAZOLE-TRIMETHOPRIM 800-160 MG PO TABS
1.0000 | ORAL_TABLET | Freq: Once | ORAL | Status: AC
  Administered 2021-11-10: 1 via ORAL
  Filled 2021-11-10: qty 1

## 2021-11-10 MED ORDER — ALLOPURINOL 300 MG PO TABS
300.0000 mg | ORAL_TABLET | Freq: Every day | ORAL | Status: DC
  Administered 2021-11-11 – 2021-11-13 (×3): 300 mg via ORAL
  Filled 2021-11-10 (×3): qty 1

## 2021-11-10 MED ORDER — METOPROLOL TARTRATE 25 MG PO TABS
25.0000 mg | ORAL_TABLET | Freq: Two times a day (BID) | ORAL | Status: DC
  Administered 2021-11-11 – 2021-11-13 (×5): 25 mg via ORAL
  Filled 2021-11-10 (×5): qty 1

## 2021-11-10 MED ORDER — PANTOPRAZOLE SODIUM 40 MG PO TBEC
40.0000 mg | DELAYED_RELEASE_TABLET | Freq: Two times a day (BID) | ORAL | Status: DC
  Administered 2021-11-11 – 2021-11-13 (×5): 40 mg via ORAL
  Filled 2021-11-10 (×5): qty 1

## 2021-11-10 MED ORDER — SODIUM CHLORIDE 0.9% FLUSH
3.0000 mL | Freq: Two times a day (BID) | INTRAVENOUS | Status: DC
  Administered 2021-11-10 – 2021-11-13 (×6): 3 mL via INTRAVENOUS

## 2021-11-10 MED ORDER — SULFAMETHOXAZOLE-TRIMETHOPRIM 800-160 MG PO TABS
1.0000 | ORAL_TABLET | Freq: Two times a day (BID) | ORAL | Status: DC
  Administered 2021-11-11 – 2021-11-13 (×5): 1 via ORAL
  Filled 2021-11-10 (×7): qty 1

## 2021-11-10 NOTE — H&P (Signed)
History and Physical   Renee Waters YQM:[id number] DOB: Nov 14, 1945 DOA: 11/10/2021  PCP: [name], MD   Patient coming from: Home  Chief Complaint: Progressive shortness of breath  HPI: Renee Waters is a 76 y.o. female with medical history significant of lupus, labile hypertension, GERD, chronic pain, insomnia, depression, anxiety, anemia, CKD 3B, myeloproliferative neoplasm presenting with chronic cough and progressive shortness of breath.  Patient has known history of chronic cough for the past 4 weeks with gradually worsening shortness of breath recently.  She is been followed by pulmonology, Dr. [name], as well as cardiology.  She had been on Augmentin without significant improvement until yesterday due to worsening diarrhea, with plan to check for C. difficile as well.  Soon cultures on 9/19 showed Pseudomonas and stenotrophomonas, there was concern patient may admission for IV antibiotics.  Patient presented today due to her worsening shortness of breath.  She also reports***  She denies fevers, chills, chest pain, abdominal pain, constipation, nausea, vomiting.***  ED Course: Vital signs in the ED significant for labile blood pressure ranging from the 52W to 413 systolic.  Lab work-up included CMP with creatinine stable 1.18, glucose 118, calcium 8.6, alk phos stable at 160.  CBC with leukocytosis to 123 which is stable, hemoglobin of 8.2 which is stable, platelets 77.  BNP borderline at 168.  Lactic acid negative x2.  Respiratory panel flu COVID-negative.  Blood cultures and C. difficile panel pending.  Chest x-ray showed no acute abnormality.  CT of the chest showed bilateral cylindrical bronchiectasis, upper lobe predominant emphysema, subpleural hypoventilatory changes, decreased caliber of the bronchial tree possibly representing bronchial malacia, splenomegaly.  Patient started on meropenem, Bactrim, oxycodone in the ED.  Pulmonology consulted and will see the patient  tomorrow recommending admission with IV antibiotics including the above meropenem and then p.o. Bactrim.  Review of Systems: As per HPI otherwise all other systems reviewed and are negative.  Past Medical History:  Diagnosis Date   Acute cholangitis    Allergy    Anemia    Anxiety    Arthritis    Phreesia 03/18/2020   Barrett's esophagus    Cataract    Chronic back pain    Chronic neck pain    CKD (chronic kidney disease) stage 3, GFR 30-59 ml/min ([place]) 10/29/2020   Depression    Genital herpes    GERD (gastroesophageal reflux disease)    H/O degenerative disc disease    History of blood transfusion    Hypertension    Insomnia    Lupus (systemic lupus erythematosus) ([place])    Neuromuscular disorder ([place])    Osteoarthritis    S/P colonoscopy June 2005   normal, no polyps   S/P endoscopy June 2005, Oct 2009   2005: short-segment Barrett's, 2009: short-segment Barrett's   Upper respiratory tract infection 07/09/2020   UTI (lower urinary tract infection) 11/2012    Past Surgical History:  Procedure Laterality Date   ABDOMINAL HYSTERECTOMY     BACK SURGERY     BIOPSY N/A 03/20/2014   Procedure: BIOPSY;  Surgeon: Daneil Dolin, MD;  Location: [facility];  Service: Endoscopy;  Laterality: N/A;   BIOPSY  09/14/2015   Procedure: BIOPSY;  Surgeon: Daneil Dolin, MD;  Location: [facility];  Service: Endoscopy;;  esophageal and gastric   BIOPSY  07/23/2019   Procedure: BIOPSY;  Surgeon: Daneil Dolin, MD;  Location[facility];  Service: Endoscopy;;  esophageal    CARPAL TUNNEL RELEASE Left  2013   cervical disectomy  2002   CESAREAN SECTION N/A    Phreesia 03/18/2020   CHOLECYSTECTOMY     with lysis of adhesions for sbo; "ruptured gallbladder".   COLONOSCOPY  11/09/2011   RMR: Melanosis coli   COLONOSCOPY WITH PROPOFOL N/A 09/14/2015   Dr. Gala Romney: diverticulosis, 47m TA removed. next TCS 09/2020.    COLONOSCOPY WITH PROPOFOL N/A 04/30/2020   Procedure: COLONOSCOPY WITH PROPOFOL;   Surgeon: RDaneil Dolin MD;  [facility];  Service: Endoscopy;  Laterality: N/A;  PM (ASA 3)   DENTAL SURGERY  11/2015   multiple tooth extraction   ESOPHAGOGASTRODUODENOSCOPY  11/29/2007   salmon-colored  tongue   longest stable at  3 cm, distal esophagus as described previously status post biopsy/ Hiatal hernia, otherwise normal stomach D1 and D2   ESOPHAGOGASTRODUODENOSCOPY  01/06/11   short segment Barrett's esophagus s/p bx/Hiatal hernia   ESOPHAGOGASTRODUODENOSCOPY (EGD) WITH PROPOFOL N/A 03/20/2014   RDHR:CBULAGTXdistal esophagus short segment barrett's, bx with no dysplasia. next egd in 03/2017   ESOPHAGOGASTRODUODENOSCOPY (EGD) WITH PROPOFOL N/A 09/14/2015   Dr. RGala Romney Barrett's without dysplasia, gastritis benign bx, hiatal hernia. next EGD 09/2018.   ESOPHAGOGASTRODUODENOSCOPY (EGD) WITH PROPOFOL N/A 07/23/2019   Procedure: ESOPHAGOGASTRODUODENOSCOPY (EGD) WITH PROPOFOL;  Surgeon: RDaneil Dolin MD;  [facility];  Service: Endoscopy;  Laterality: N/A;  3:00pm   EYE SURGERY N/A    Phreesia 03/18/2020   HERNIA REPAIR Right 07/2010   Dr. BZada Girt  IR IMAGING GUIDED PORT INSERTION  02/09/2021   JOINT REPLACEMENT     [facility]  POLYPECTOMY  09/14/2015   Procedure: POLYPECTOMY;  Surgeon: RDaneil Dolin MD;  [facility];  Service: Endoscopy;;  ascending colon   right hip replacement  07/2010   went back in sept 2012 to fix   SHOULDER ARTHROSCOPY  2008   left   SPINE SURGERY N/A    Phreesia 03/18/2020   TOTAL HIP REVISION Right 12/17/2012   Procedure: RIGHT TOTAL HIP REVISION;  Surgeon: MMauri Pole MD;  Location: [facility];  Service: Orthopedics;  Laterality: Right;   WRIST SURGERY Right 2011   open reduction right wrist.    Social History  reports that she quit smoking about 18 years ago. Her smoking use included cigarettes. She has a 6.25 pack-year smoking history. She has never used smokeless tobacco. She  reports that she does not drink alcohol and does not use drugs.  Allergies  Allergen Reactions   Bee Venom Swelling and Hives   Acetaminophen     itches   Amlodipine     Hair loss   Red Dye Other (See Comments)   Tyloxapol    Other Itching, Rash and Swelling   Oxycodone-Acetaminophen Rash    Pt states, "this gives her a rash, but at home she takes oxycodone for pain relief" Pt states, "this gives her a rash, but at home she takes oxycodone for pain relief"    Family History  Problem Relation Age of Onset   Hypertension Mother    Stroke Mother    Colon cancer Neg Hx    Anesthesia problems Neg Hx    Hypotension Neg Hx    Malignant hyperthermia Neg Hx    Pseudochol deficiency Neg Hx    Gastric cancer Neg Hx    Esophageal cancer Neg Hx   Reviewed on admission  Prior to Admission medications   Medication Sig Start  Date End Date Taking? Authorizing Provider  acyclovir (ZOVIRAX) 800 MG tablet Take 1 tablet (800 mg total) by mouth 5 (five) times daily. 04/21/21   [name], MD  acyclovir ointment (ZOVIRAX) 5 % Apply 1 application. topically every 3 (three) hours. 04/21/21   [name], MD  albuterol (VENTOLIN HFA) 108 (90 Base) MCG/ACT inhaler Inhale 2 puffs into the lungs every 6 (six) hours as needed for wheezing or shortness of breath. 10/28/21   Lindell Spar, MD  allopurinol (ZYLOPRIM) 300 MG tablet Take 1 tablet (300 mg total) by mouth daily. 10/04/21   Derek Jack, MD  amoxicillin-clavulanate (AUGMENTIN) 875-125 MG tablet Take 1 tablet by mouth 2 (two) times daily. 11/01/21   Tanda Rockers, MD  budesonide-formoterol (SYMBICORT) 80-4.5 MCG/ACT inhaler Take 2 puffs first thing in am and then another 2 puffs about 12 hours later. 11/01/21   Tanda Rockers, MD  citalopram (CELEXA) 20 MG tablet Take 1 tablet by mouth once daily 08/30/21   [name], MD  EPINEPHRINE 0.3 mg/0.3 mL IJ SOAJ injection INJECT 0.3 MLS INTO MUSCLE ONCE AS NEEDED Patient  taking differently: Inject 0.3 mg into the muscle as needed for anaphylaxis. 10/04/16   [name], MD  furosemide (LASIX) 20 MG tablet Take 1 tablet (20 mg total) by mouth daily as needed. 09/06/21   Derek Jack, MD  gabapentin (NEURONTIN) 300 MG capsule Take one capsule by mouth once daily, as needed, for pain 03/30/21   [name], MD  lidocaine-prilocaine (EMLA) cream Apply 1 application. topically as needed. 06/18/21   Derek Jack, MD  metoprolol tartrate (LOPRESSOR) 25 MG tablet Take 1 tablet (25 mg total) by mouth 2 (two) times daily. 10/21/21   Johnson, Clanford L, MD  mirtazapine (REMERON) 15 MG tablet Take 1 tablet (15 mg total) by mouth at bedtime. 06/07/21   [name], MD  oxyCODONE (OXY IR/ROXICODONE) 5 MG immediate release tablet Take 5 mg by mouth every 4 (four) hours as needed. 04/22/21   [provider]  pantoprazole (PROTONIX) 40 MG tablet Take 30- 60 min before your first and last meals of the day 11/01/21   Tanda Rockers, MD  potassium chloride (KLOR-CON) 10 MEQ tablet Take 1 tablet by mouth once daily 08/06/21   Derek Jack, MD  ruxolitinib phosphate (JAKAFI) 15 MG tablet Take 1 tablet (15 mg total) by mouth 2 (two) times daily. 10/07/21   Derek Jack, MD  temazepam (RESTORIL) 30 MG capsule Take 1 capsule (30 mg total) by mouth at bedtime as needed for sleep. 03/09/21   [name], MD  tiZANidine (ZANAFLEX) 4 MG tablet Take 2 tablets (8 mg total) by mouth 3 (three) times daily as needed (spasms). Patient taking differently: Take 4-8 mg by mouth 3 (three) times daily as needed (spasms). 09/24/21   [name], MD    Physical Exam: Vitals:   11/10/21 1845 11/10/21 1915 11/10/21 2300 11/10/21 2300  BP: (!) 181/74 (!) 165/75 (!) 175/83   Pulse: 67 65 75   Resp: 16 18 17    Temp:    99.7 F (37.6 C)  TempSrc:    Oral  SpO2: 98% 100% 97%     Physical Exam Constitutional:      General: She is not  in acute distress.    Appearance: Normal appearance.  HENT:     Head: Normocephalic and atraumatic.     Mouth/Throat:     Mouth: Mucous membranes are  moist.     Pharynx: Oropharynx is clear.  Eyes:     Extraocular Movements: Extraocular movements intact.     Pupils: Pupils are equal, round, and reactive to light.  Cardiovascular:     Rate and Rhythm: Normal rate and regular rhythm.     Pulses: Normal pulses.     Heart sounds: Normal heart sounds.  Pulmonary:     Effort: Pulmonary effort is normal. No respiratory distress.     Breath sounds: Normal breath sounds.  Abdominal:     General: Bowel sounds are normal. There is no distension.     Palpations: Abdomen is soft.     Tenderness: There is no abdominal tenderness.  Musculoskeletal:        General: No swelling or deformity.  Skin:    General: Skin is warm and dry.  Neurological:     General: No focal deficit present.     Mental Status: Mental status is at baseline.   ***  Labs on Admission: I have personally reviewed following labs and imaging studies  CBC: Recent Labs  Lab 11/04/21 1202 11/10/21 1830  WBC 154.9* 123.9*  NEUTROABS 117.7* 78.1*  HGB 8.6* 8.2*  HCT 27.0* 24.9*  MCV 93.1 92.2  PLT 103* 77*    Basic Metabolic Panel: Recent Labs  Lab 11/10/21 1830  NA 141  K 3.9  CL 109  CO2 23  GLUCOSE 118*  BUN 23  CREATININE 1.18*  CALCIUM 8.6*    GFR: Estimated Creatinine Clearance: 37.1 mL/min (A) (by C-G formula based on SCr of 1.18 mg/dL (H)).  Liver Function Tests: Recent Labs  Lab 11/10/21 1830  AST 37  ALT 25  ALKPHOS 160*  BILITOT 0.6  PROT 7.3  ALBUMIN 3.7    Urine analysis:    Component Value Date/Time   COLORURINE YELLOW 10/19/2021 1828   APPEARANCEUR HAZY (A) 10/19/2021 1828   LABSPEC 1.010 10/19/2021 1828   PHURINE 5.0 10/19/2021 1828   GLUCOSEU NEGATIVE 10/19/2021 1828   GLUCOSEU NEG mg/dL 05/26/2009 2225   HGBUR NEGATIVE 10/19/2021 1828   HGBUR negative 08/19/2008 1124    BILIRUBINUR NEGATIVE 10/19/2021 1828   BILIRUBINUR negative 10/08/2021 1134   BILIRUBINUR neg 08/15/2017 0955   KETONESUR NEGATIVE 10/19/2021 1828   PROTEINUR NEGATIVE 10/19/2021 1828   UROBILINOGEN 0.2 10/08/2021 1134   UROBILINOGEN 0.2 12/05/2012 1150   NITRITE NEGATIVE 10/19/2021 1828   LEUKOCYTESUR LARGE (A) 10/19/2021 1828    Radiological Exams on Admission: CT Chest High Resolution  Result Date: 11/10/2021 CLINICAL DATA:  Cough, chronic/persisting > 8 weeks, history of myeloproliferative disorder EXAM: CT CHEST WITHOUT CONTRAST TECHNIQUE: Multidetector CT imaging of the chest was performed following the standard protocol without intravenous contrast. High resolution imaging of the lungs, as well as inspiratory and expiratory imaging, was performed. RADIATION DOSE REDUCTION: This exam was performed according to the departmental dose-optimization program which includes automated exposure control, adjustment of the mA and/or kV according to patient size and/or use of iterative reconstruction technique. COMPARISON:  11/10/2021, 09/01/2021 FINDINGS: Cardiovascular: Unenhanced imaging of the heart demonstrates borderline cardiomegaly without pericardial effusion. Normal caliber of the thoracic aorta. There is atherosclerosis of the aorta and coronary vasculature unchanged. Right chest wall port via internal jugular approach tip within the atriocaval junction. Evaluation of the vascular lumen is limited without IV contrast. Mediastinum/Nodes: No enlarged mediastinal, hilar, or axillary lymph nodes. Thyroid gland, trachea, and esophagus demonstrate no significant findings. Lungs/Pleura: Stable upper lobe predominant emphysema. Subpleural linear densities within the dependent  lower lobes likely hypoventilatory in nature. No evidence of fibrosis. There is bilateral cylindrical bronchiectasis, greatest in the lower lobes. No bronchial wall thickening. The central airways are patent. No air trapping on  expiration. Decreased caliber of the tracheobronchial tree during expiration may reflect an element of tracheobronchomalacia. Upper Abdomen: Stable splenomegaly. Otherwise no acute upper abdominal process. Musculoskeletal: There are no acute or destructive bony lesions. Reconstructed images demonstrate no additional findings. IMPRESSION: 1. Bilateral cylindrical bronchiectasis. No evidence of bronchial wall thickening. 2. Upper lobe predominant emphysema. 3. Subpleural hypoventilatory changes at the lung bases. No evidence of underlying fibrosis. 4. Decreased caliber of the tracheobronchial tree during expiration, which could reflect an element of tracheal bronchomalacia. 5. Aortic Atherosclerosis (ICD10-I70.0) and Emphysema (ICD10-J43.9). 6. Splenomegaly, compatible with known history of myeloproliferative disorder. Electronically Signed   By: Randa Ngo M.D.   On: 11/10/2021 21:55   DG Chest 2 View  Result Date: 11/10/2021 CLINICAL DATA:  Shortness of breath and chest pains for 3 months. EXAM: CHEST - 2 VIEW COMPARISON:  Chest two views 10/19/2021, [facility] chest 09/01/2021 FINDINGS: Redemonstration of right chest wall porta catheter with tip overlying the med severe vena cava. Cardiac silhouette is at the upper limits of normal size. Mediastinal contours are within normal limits. Mild-to-moderate calcification within the aortic arch. The lungs are clear. No pleural effusion or pneumothorax. Mild multilevel degenerative disc changes of the thoracic spine. IMPRESSION: No active cardiopulmonary disease. Electronically Signed   By: Yvonne Kendall M.D.   On: 11/10/2021 14:58    EKG: Not performed in the emergency department thus far.  Assessment/Plan Principal Problem:   Pneumonia Active Problems:   Depression   Labile hypertension   GERD (gastroesophageal reflux disease)   Chronic midline low back pain with sciatica   Insomnia secondary to depression with anxiety   Systemic lupus (HCC)   Anemia,  normocytic normochromic   CKD (chronic kidney disease) stage 3, GFR 30-59 ml/min (HCC)   Anxiety   MPN (myeloproliferative neoplasm) (HCC)   Pneumonia > Patient presenting with chronic pneumonia in the setting of myeloproliferative neoplasm/immunosuppressed status.  She is a chronic cough for the past 4 weeks with gradually worsening shortness of breath.  Followed by pulmonology, her PCP, cardiology outpatient.  Had been on Augmentin until yesterday due to increasing diarrhea and ineffectiveness. > Sputum cultures on 9/19 showed Pseudomonas and stenotrophomonas. > Pulmonology consulted here and will consult on the patient, they recommended meropenem and p.o. Bactrim for now, patient may require extended course. - Appreciate pulmonology recommendations - Monitor on telemetry - Monitor fever curve and WBC - Continue with meropenem and Bactrim for now  Labile hypertension > Blood pressure ranging from the 59X to 774 systolic in the ED.  His chronic history of this with blood pressures as low as the 14E to 90 systolic outpatient. - Hold off on any of her as needed Lasix - Hold metoprolol for now  Anemia Thrombocytopenia Myeloproliferative neoplasm > Bone biopsy consistent with granulocytic proliferation: CMML versus myeloproliferative disorder; with further evaluation more suggestive of myeloproliferative neoplasm.  Known splenomegaly. > Leukocytosis stable at 123, hemoglobin stable at 8.2, platelets somewhat lower than baseline at 77. - Home Jakafi*** currently held in the setting of this pneumonia J - Continue home allopurinol - Continue home oxycodone***  GERD - Continue home PPI  Insomnia Depression Anxiety - Continue home Celexa, mirtazapine, temazepam  CKD 3B > Creatinine stable 1.18. - Trend renal function electrolytes - Lupus - Noted, not on any medications  for this  Chronic pain - Continue home oxycodone and gabapentin ***  DVT prophylaxis: SCDs Code Status:    Full Family Communication:  ***  Disposition Plan:   Patient is from:  Home  Anticipated DC to:  Home  Anticipated DC date:  1 to 3 days  Anticipated DC barriers: None  Consults called:  Pulmonology, consulted in the ED, will see the patient Admission status:  Observation, telemetry  Severity of Illness: The appropriate patient status for this patient is OBSERVATION. Observation status is judged to be reasonable and necessary in order to provide the required intensity of service to ensure the patient's safety. The patient's presenting symptoms, physical exam findings, and initial radiographic and laboratory data in the context of their medical condition is felt to place them at decreased risk for further clinical deterioration. Furthermore, it is anticipated that the patient will be medically stable for discharge from the hospital within 2 midnights of admission.    Marcelyn Bruins MD [facility]  How to contact the [facility] Attending or Consulting provider [place] or covering provider during after hours [place], for this patient?   Check the care team in [facility] and look for a) attending/consulting [facility] provider listed and b) the [facility] team listed Log into [email or web address] and use [place]'s universal password to access. If you do not have the password, please contact the hospital operator. Locate the [facility] provider you are looking for under [facility] and page to a number that you can be directly reached. If you still have difficulty reaching the provider, please page the [facility] (Director on Call) for the Hospitalists listed on amion for assistance.  11/10/2021, 11:35 PM

## 2021-11-10 NOTE — ED Provider Triage Note (Signed)
Emergency Medicine Provider Triage Evaluation Note  Antonela Freiman , a 76 y.o. female  was evaluated in triage.  Pt complains of needing admission for IV antibiotics.  Seen by cardiology, Dr. Harl Bowie today.  She did have persistent cough.  Had sputum cultures which showed Pseudomonas.  Was initially given Augmentin.  Had profound diarrhea with this subsequently stopped.  She continues to have symptoms.  Cardiology recommended admission for IV antibiotics.  Review of Systems  Positive: Cough, sob, diarrhea Negative: fever  Physical Exam  BP (!) 88/55 (BP Location: Left Arm)   Pulse (!) 51   Temp 98.3 F (36.8 C) (Oral)   Resp 18   SpO2 92%  Gen:   Awake, no distress   Resp:  Normal effort  MSK:   Moves extremities without difficulty  Other:    Medical Decision Making  Medically screening exam initiated at 2:24 PM.  Appropriate orders placed.  Andilyn Bettcher was informed that the remainder of the evaluation will be completed by another provider, this initial triage assessment does not replace that evaluation, and the importance of remaining in the ED until their evaluation is complete.  SOB, shortness of breath, diarrhea.  Patient hypotensive.  Nursing aware of need for bed in back. Sepsis labs ordered   Ethelreda Sukhu A, PA-C 11/10/21 1426

## 2021-11-10 NOTE — Telephone Encounter (Signed)
ATC patient to notify

## 2021-11-10 NOTE — Telephone Encounter (Signed)
Agree with Dr Patience Musca recs   No more Augmentin , will need sinus CT and hrct chest  before more abx  to r/o sinusitis and bronchiectasis but let's hold off for now and see how she responds to the other recs I made

## 2021-11-10 NOTE — ED Notes (Signed)
CRITICAL VALUE STICKER  CRITICAL VALUE:wbc 123.9  RECEIVER (on-site recipient of call):I. Maryna Yeagle  DATE & TIME NOTIFIED: 11/10/21 1912  MESSENGER (representative from lab):Lab  MD NOTIFIED: [place]  TIME OF NOTIFICATION:1912  RESPONSE:

## 2021-11-10 NOTE — ED Provider Notes (Signed)
[facility] EMERGENCY DEPARTMENT Provider Note   CSN: [id number] Arrival date & time: 11/10/21  1307     History  Chief Complaint  Patient presents with   Shortness of Breath    Renee Waters is a 76 y.o. female.  76 year old female with prior medical history as detailed below presents for evaluation.  Patient reportedly sent to the ED for admission for worsening cough with persistent sputum production.  Patient was on Augmentin until yesterday.  She was advised to stop Augmentin after development of significant diarrhea.  Patient followed by Dr. [name] with pulmonary.   Patient with sputum cultures from September 19 that grew out Pseudomonas and Stenotrophomonas maltophilia.  Patient with known history of myeloproliferative disorder, CKD, lupus.   Shortness of Breath Associated symptoms: cough        Home Medications Prior to Admission medications   Medication Sig Start Date End Date Taking? Authorizing Provider  acyclovir (ZOVIRAX) 800 MG tablet Take 1 tablet (800 mg total) by mouth 5 (five) times daily. 04/21/21   [name], MD  acyclovir ointment (ZOVIRAX) 5 % Apply 1 application. topically every 3 (three) hours. 04/21/21   [name], MD  albuterol (VENTOLIN HFA) 108 (90 Base) MCG/ACT inhaler Inhale 2 puffs into the lungs every 6 (six) hours as needed for wheezing or shortness of breath. 10/28/21   Lindell Spar, MD  allopurinol (ZYLOPRIM) 300 MG tablet Take 1 tablet (300 mg total) by mouth daily. 10/04/21   Derek Jack, MD  amoxicillin-clavulanate (AUGMENTIN) 875-125 MG tablet Take 1 tablet by mouth 2 (two) times daily. 11/01/21   Tanda Rockers, MD  budesonide-formoterol (SYMBICORT) 80-4.5 MCG/ACT inhaler Take 2 puffs first thing in am and then another 2 puffs about 12 hours later. 11/01/21   Tanda Rockers, MD  citalopram (CELEXA) 20 MG tablet Take 1 tablet by mouth once daily 08/30/21   [name], MD  EPINEPHRINE  0.3 mg/0.3 mL IJ SOAJ injection INJECT 0.3 MLS INTO MUSCLE ONCE AS NEEDED Patient taking differently: Inject 0.3 mg into the muscle as needed for anaphylaxis. 10/04/16   [name], MD  furosemide (LASIX) 20 MG tablet Take 1 tablet (20 mg total) by mouth daily as needed. 09/06/21   Derek Jack, MD  gabapentin (NEURONTIN) 300 MG capsule Take one capsule by mouth once daily, as needed, for pain 03/30/21   [name], MD  lidocaine-prilocaine (EMLA) cream Apply 1 application. topically as needed. 06/18/21   Derek Jack, MD  metoprolol tartrate (LOPRESSOR) 25 MG tablet Take 1 tablet (25 mg total) by mouth 2 (two) times daily. 10/21/21   Johnson, Clanford L, MD  mirtazapine (REMERON) 15 MG tablet Take 1 tablet (15 mg total) by mouth at bedtime. 06/07/21   [name], MD  oxyCODONE (OXY IR/ROXICODONE) 5 MG immediate release tablet Take 5 mg by mouth every 4 (four) hours as needed. 04/22/21   [provider]  pantoprazole (PROTONIX) 40 MG tablet Take 30- 60 min before your first and last meals of the day 11/01/21   Tanda Rockers, MD  potassium chloride (KLOR-CON) 10 MEQ tablet Take 1 tablet by mouth once daily 08/06/21   Derek Jack, MD  ruxolitinib phosphate (JAKAFI) 15 MG tablet Take 1 tablet (15 mg total) by mouth 2 (two) times daily. 10/07/21   Derek Jack, MD  temazepam (RESTORIL) 30 MG capsule Take 1 capsule (30 mg total) by mouth at bedtime as needed for sleep. 03/09/21  [name], MD  tiZANidine (ZANAFLEX) 4 MG tablet Take 2 tablets (8 mg total) by mouth 3 (three) times daily as needed (spasms). Patient taking differently: Take 4-8 mg by mouth 3 (three) times daily as needed (spasms). 09/24/21   [name], MD      Allergies    Bee venom, Acetaminophen, Amlodipine, Red dye, Tyloxapol, Other, and Oxycodone-acetaminophen    Review of Systems   Review of Systems  Respiratory:  Positive for cough and shortness of  breath.   All other systems reviewed and are negative.   Physical Exam Updated Vital Signs BP (!) 165/75   Pulse 65   Temp 98.1 F (36.7 C)   Resp 18   SpO2 100%  Physical Exam Vitals and nursing note reviewed.  Constitutional:      General: She is not in acute distress.    Appearance: Normal appearance. She is well-developed.  HENT:     Head: Normocephalic and atraumatic.  Eyes:     Conjunctiva/sclera: Conjunctivae normal.     Pupils: Pupils are equal, round, and reactive to light.  Cardiovascular:     Rate and Rhythm: Normal rate and regular rhythm.     Heart sounds: Normal heart sounds.  Pulmonary:     Effort: Pulmonary effort is normal. No respiratory distress.     Breath sounds: Normal breath sounds.  Abdominal:     General: There is no distension.     Palpations: Abdomen is soft.     Tenderness: There is no abdominal tenderness.  Musculoskeletal:        General: No deformity. Normal range of motion.     Cervical back: Normal range of motion and neck supple.  Skin:    General: Skin is warm and dry.  Neurological:     General: No focal deficit present.     Mental Status: She is alert and oriented to person, place, and time.     ED Results / Procedures / Treatments   Labs (all labs ordered are listed, but only abnormal results are displayed) Labs Reviewed  CBC WITH DIFFERENTIAL/PLATELET - Abnormal; Notable for the following components:      Result Value   WBC 123.9 (*)    RBC 2.70 (*)    Hemoglobin 8.2 (*)    HCT 24.9 (*)    RDW 20.2 (*)    Platelets 77 (*)    nRBC 2.4 (*)    Neutro Abs 78.1 (*)    Monocytes Absolute 7.4 (*)    Eosinophils Absolute 5.0 (*)    Abs Immature Granulocytes 31.00 (*)    All other components within normal limits  COMPREHENSIVE METABOLIC PANEL - Abnormal; Notable for the following components:   Glucose, Bld 118 (*)    Creatinine, Ser 1.18 (*)    Calcium 8.6 (*)    Alkaline Phosphatase 160 (*)    GFR, Estimated 48 (*)     All other components within normal limits  BRAIN NATRIURETIC PEPTIDE - Abnormal; Notable for the following components:   B Natriuretic Peptide 168.7 (*)    All other components within normal limits  RESP PANEL BY RT-PCR (FLU A&B, COVID) ARPGX2  CULTURE, BLOOD (ROUTINE X 2)  CULTURE, BLOOD (ROUTINE X 2)  C DIFFICILE QUICK SCREEN W PCR REFLEX    LACTIC ACID, PLASMA  LACTIC ACID, PLASMA  PATHOLOGIST SMEAR REVIEW    EKG None  Radiology CT Chest High Resolution  Result Date: 11/10/2021 CLINICAL DATA:  Cough, chronic/persisting > 8  weeks, history of myeloproliferative disorder EXAM: CT CHEST WITHOUT CONTRAST TECHNIQUE: Multidetector CT imaging of the chest was performed following the standard protocol without intravenous contrast. High resolution imaging of the lungs, as well as inspiratory and expiratory imaging, was performed. RADIATION DOSE REDUCTION: This exam was performed according to the departmental dose-optimization program which includes automated exposure control, adjustment of the mA and/or kV according to patient size and/or use of iterative reconstruction technique. COMPARISON:  11/10/2021, 09/01/2021 FINDINGS: Cardiovascular: Unenhanced imaging of the heart demonstrates borderline cardiomegaly without pericardial effusion. Normal caliber of the thoracic aorta. There is atherosclerosis of the aorta and coronary vasculature unchanged. Right chest wall port via internal jugular approach tip within the atriocaval junction. Evaluation of the vascular lumen is limited without IV contrast. Mediastinum/Nodes: No enlarged mediastinal, hilar, or axillary lymph nodes. Thyroid gland, trachea, and esophagus demonstrate no significant findings. Lungs/Pleura: Stable upper lobe predominant emphysema. Subpleural linear densities within the dependent lower lobes likely hypoventilatory in nature. No evidence of fibrosis. There is bilateral cylindrical bronchiectasis, greatest in the lower lobes. No  bronchial wall thickening. The central airways are patent. No air trapping on expiration. Decreased caliber of the tracheobronchial tree during expiration may reflect an element of tracheobronchomalacia. Upper Abdomen: Stable splenomegaly. Otherwise no acute upper abdominal process. Musculoskeletal: There are no acute or destructive bony lesions. Reconstructed images demonstrate no additional findings. IMPRESSION: 1. Bilateral cylindrical bronchiectasis. No evidence of bronchial wall thickening. 2. Upper lobe predominant emphysema. 3. Subpleural hypoventilatory changes at the lung bases. No evidence of underlying fibrosis. 4. Decreased caliber of the tracheobronchial tree during expiration, which could reflect an element of tracheal bronchomalacia. 5. Aortic Atherosclerosis (ICD10-I70.0) and Emphysema (ICD10-J43.9). 6. Splenomegaly, compatible with known history of myeloproliferative disorder. Electronically Signed   By: Randa Ngo M.D.   On: 11/10/2021 21:55   DG Chest 2 View  Result Date: 11/10/2021 CLINICAL DATA:  Shortness of breath and chest pains for 3 months. EXAM: CHEST - 2 VIEW COMPARISON:  Chest two views 10/19/2021, AP chest 09/01/2021 FINDINGS: Redemonstration of right chest wall porta catheter with tip overlying the med severe vena cava. Cardiac silhouette is at the upper limits of normal size. Mediastinal contours are within normal limits. Mild-to-moderate calcification within the aortic arch. The lungs are clear. No pleural effusion or pneumothorax. Mild multilevel degenerative disc changes of the thoracic spine. IMPRESSION: No active cardiopulmonary disease. Electronically Signed   By: Yvonne Kendall M.D.   On: 11/10/2021 14:58    Procedures Procedures    Medications Ordered in ED Medications  sulfamethoxazole-trimethoprim (BACTRIM DS) 800-160 MG per tablet 1 tablet (has no administration in time range)  meropenem (MERREM) 1 g in sodium chloride 0.9 % 100 mL IVPB (has no  administration in time range)  oxyCODONE (Oxy IR/ROXICODONE) immediate release tablet 5 mg (has no administration in time range)  0.9 %  sodium chloride infusion ( Intravenous New Bag/Given 11/10/21 1848)    ED Course/ Medical Decision Making/ A&P                           Medical Decision Making Amount and/or Complexity of Data Reviewed Radiology: ordered.  Risk Prescription drug management.    Medical Screen Complete  This patient presented to the ED with complaint of cough/increasing sputum production.  This complaint involves an extensive number of treatment options. The initial differential diagnosis includes, but is not limited to, pneumonia, metabolic abnormality, etc.  This presentation is: Acute, Chronic, Self-Limited,  Previously Undiagnosed, Uncertain Prognosis, Complicated, Systemic Symptoms, and Threat to Life/Bodily Function  Patient with worsening cough and increased sputum production despite courses of outpatient antibiotics (most recently augmentin).  Patient with recent sputum cultures suggesting possible Pseudomonas and Stenotrophomonas maltophilia infection.  Patient followed by Dr. [name] with pulmonary.  Case and ED results and imaging discussed with Pulmonary.  Patient would benefit from admission for IV antibiotics.  Pulmonary will consult.  Hospitalist service made aware of case and will evaluate for admission.    Co morbidities that complicated the patient's evaluation  Other proliferative neoplasm, CKD    Additional history obtained:  Additional history obtained from [facility] External records from outside sources obtained and reviewed including prior ED visits and prior Inpatient records.    Lab Tests:  I ordered and personally interpreted labs.  The pertinent results include: CBC, CMP, BNP, lactic acid   Imaging Studies ordered:  I ordered imaging studies including chest x-ray, high-resolution CT chest I independently visualized and  interpreted obtained imaging which showed bronchiectasis I agree with the radiologist interpretation.   Cardiac Monitoring:  The patient was maintained on a cardiac monitor.  I personally viewed and interpreted the cardiac monitor which showed an underlying rhythm of: NSR   Medicines ordered:  I ordered medication including pain medication, antibiotics for pulmonary infection Reevaluation of the patient after these medicines showed that the patient: improved     Consultations Obtained:  I consulted Pulmonary,  and discussed lab and imaging findings as well as pertinent plan of care.    Problem List / ED Course:  Cough, bronchiectasis   Reevaluation:  After the interventions noted above, I reevaluated the patient and found that they have: stayed the same   Disposition:  After consideration of the diagnostic results and the patients response to treatment, I feel that the patent would benefit from admission.          Final Clinical Impression(s) / ED Diagnoses Final diagnoses:  Cough, unspecified type    Rx / DC Orders ED Discharge Orders     None         Valarie Merino, MD 11/10/21 2328

## 2021-11-10 NOTE — ED Triage Notes (Signed)
Patient sent to ED for further evaluation of persistent cough after treated for antibiotics. Patient is alert, oriented, and in no apparent distress at this time

## 2021-11-10 NOTE — Telephone Encounter (Signed)
ATC patient. Per epic pt is currently at ED. Notified her of response on vm and advised her to call back if she needs [place] before her ov with [place] on 10/10. Nothing further needed for now.

## 2021-11-10 NOTE — ED Notes (Signed)
Patient didn't want the ekg did because she stated that the office di one 2 hours ago

## 2021-11-10 NOTE — Telephone Encounter (Signed)
Spoke with patient's daughter.

## 2021-11-10 NOTE — Telephone Encounter (Signed)
Daughter called in patient behalf.  Patient has gone to ER today after Cardiology appointment  Daughter requesting call a call back has some questions and concerns.

## 2021-11-10 NOTE — Patient Instructions (Signed)
Medication Instructions:  No Changes In Medications at this time.  *If you need a refill on your cardiac medications before your next appointment, please call your pharmacy*  Lab Work: None Ordered At This Time.  If you have labs (blood work) drawn today and your tests are completely normal, you will receive your results only by: [place] (if you have MyChart) OR A paper copy in the mail If you have any lab test that is abnormal or we need to change your treatment, we will call you to review the results.  Testing/Procedures: None Ordered At This Time.   Follow-Up: At [facility], you and your health needs are our priority.  As part of our continuing mission to provide you with exceptional heart care, we have created designated Provider Care Teams.  These Care Teams include your primary Cardiologist (physician) and Advanced Practice Providers (APPs -  Physician Assistants and Nurse Practitioners) who all work together to provide you with the care you need, when you need it.  Your next appointment:   3 MONTHS  The format for your next appointment:   In Person  Provider:   Janina Mayo, MD     Other Instructions PLEASE REPORT TO THE EMERGENCY ROOM AT [facility]

## 2021-11-11 ENCOUNTER — Inpatient Hospital Stay: Payer: Medicare Other

## 2021-11-11 DIAGNOSIS — J180 Bronchopneumonia, unspecified organism: Secondary | ICD-10-CM | POA: Diagnosis present

## 2021-11-11 DIAGNOSIS — N1832 Chronic kidney disease, stage 3b: Secondary | ICD-10-CM | POA: Diagnosis present

## 2021-11-11 DIAGNOSIS — J470 Bronchiectasis with acute lower respiratory infection: Secondary | ICD-10-CM | POA: Diagnosis present

## 2021-11-11 DIAGNOSIS — D471 Chronic myeloproliferative disease: Secondary | ICD-10-CM | POA: Diagnosis present

## 2021-11-11 DIAGNOSIS — E860 Dehydration: Secondary | ICD-10-CM | POA: Diagnosis present

## 2021-11-11 DIAGNOSIS — N179 Acute kidney failure, unspecified: Secondary | ICD-10-CM | POA: Diagnosis not present

## 2021-11-11 DIAGNOSIS — D849 Immunodeficiency, unspecified: Secondary | ICD-10-CM | POA: Diagnosis present

## 2021-11-11 DIAGNOSIS — Z87891 Personal history of nicotine dependence: Secondary | ICD-10-CM | POA: Diagnosis not present

## 2021-11-11 DIAGNOSIS — D696 Thrombocytopenia, unspecified: Secondary | ICD-10-CM | POA: Diagnosis present

## 2021-11-11 DIAGNOSIS — I959 Hypotension, unspecified: Secondary | ICD-10-CM | POA: Diagnosis present

## 2021-11-11 DIAGNOSIS — J439 Emphysema, unspecified: Secondary | ICD-10-CM | POA: Diagnosis present

## 2021-11-11 DIAGNOSIS — G8929 Other chronic pain: Secondary | ICD-10-CM | POA: Diagnosis present

## 2021-11-11 DIAGNOSIS — F419 Anxiety disorder, unspecified: Secondary | ICD-10-CM | POA: Diagnosis present

## 2021-11-11 DIAGNOSIS — D649 Anemia, unspecified: Secondary | ICD-10-CM | POA: Diagnosis not present

## 2021-11-11 DIAGNOSIS — M5440 Lumbago with sciatica, unspecified side: Secondary | ICD-10-CM | POA: Diagnosis present

## 2021-11-11 DIAGNOSIS — J471 Bronchiectasis with (acute) exacerbation: Secondary | ICD-10-CM | POA: Diagnosis present

## 2021-11-11 DIAGNOSIS — M329 Systemic lupus erythematosus, unspecified: Secondary | ICD-10-CM | POA: Diagnosis present

## 2021-11-11 DIAGNOSIS — F32A Depression, unspecified: Secondary | ICD-10-CM | POA: Diagnosis present

## 2021-11-11 DIAGNOSIS — R0603 Acute respiratory distress: Secondary | ICD-10-CM | POA: Diagnosis present

## 2021-11-11 DIAGNOSIS — J479 Bronchiectasis, uncomplicated: Secondary | ICD-10-CM | POA: Diagnosis not present

## 2021-11-11 DIAGNOSIS — J189 Pneumonia, unspecified organism: Secondary | ICD-10-CM | POA: Diagnosis present

## 2021-11-11 DIAGNOSIS — G4700 Insomnia, unspecified: Secondary | ICD-10-CM | POA: Diagnosis present

## 2021-11-11 DIAGNOSIS — K219 Gastro-esophageal reflux disease without esophagitis: Secondary | ICD-10-CM | POA: Diagnosis present

## 2021-11-11 DIAGNOSIS — J151 Pneumonia due to Pseudomonas: Secondary | ICD-10-CM | POA: Diagnosis not present

## 2021-11-11 DIAGNOSIS — Z79899 Other long term (current) drug therapy: Secondary | ICD-10-CM | POA: Diagnosis not present

## 2021-11-11 DIAGNOSIS — Z8616 Personal history of COVID-19: Secondary | ICD-10-CM | POA: Diagnosis not present

## 2021-11-11 DIAGNOSIS — D631 Anemia in chronic kidney disease: Secondary | ICD-10-CM | POA: Diagnosis present

## 2021-11-11 DIAGNOSIS — I129 Hypertensive chronic kidney disease with stage 1 through stage 4 chronic kidney disease, or unspecified chronic kidney disease: Secondary | ICD-10-CM | POA: Diagnosis present

## 2021-11-11 LAB — RESPIRATORY PANEL BY PCR

## 2021-11-11 LAB — COMPREHENSIVE METABOLIC PANEL
ALT: 22 U/L (ref 0–44)
AST: 39 U/L (ref 15–41)
Albumin: 3.8 g/dL (ref 3.5–5.0)
Alkaline Phosphatase: 155 U/L — ABNORMAL HIGH (ref 38–126)
Anion gap: 8 (ref 5–15)
BUN: 18 mg/dL (ref 8–23)
CO2: 24 mmol/L (ref 22–32)
Calcium: 8.5 mg/dL — ABNORMAL LOW (ref 8.9–10.3)
Chloride: 111 mmol/L (ref 98–111)
Creatinine, Ser: 1.24 mg/dL — ABNORMAL HIGH (ref 0.44–1.00)
GFR, Estimated: 45 mL/min — ABNORMAL LOW (ref >60)
Glucose, Bld: 84 mg/dL (ref 70–99)
Potassium: 3.9 mmol/L (ref 3.5–5.1)
Sodium: 143 mmol/L (ref 135–145)
Total Bilirubin: 0.7 mg/dL (ref 0.3–1.2)
Total Protein: 7.4 g/dL (ref 6.5–8.1)

## 2021-11-11 LAB — CBC
HCT: 25.1 % — ABNORMAL LOW (ref 36.0–46.0)
Hemoglobin: 8.1 g/dL — ABNORMAL LOW (ref 12.0–15.0)
MCH: 29.7 pg (ref 26.0–34.0)
MCHC: 32.3 g/dL (ref 30.0–36.0)
MCV: 91.9 fL (ref 80.0–100.0)
Platelets: 85 10*3/uL — ABNORMAL LOW (ref 150–400)
RBC: 2.73 MIL/uL — ABNORMAL LOW (ref 3.87–5.11)
RDW: 20.4 % — ABNORMAL HIGH (ref 11.5–15.5)
WBC: 132.6 10*3/uL (ref 4.0–10.5)
nRBC: 3.3 % — ABNORMAL HIGH (ref 0.0–0.2)

## 2021-11-11 LAB — PROCALCITONIN: Procalcitonin: 0.17 ng/mL

## 2021-11-11 MED ORDER — DM-GUAIFENESIN ER 30-600 MG PO TB12
1.0000 | ORAL_TABLET | Freq: Two times a day (BID) | ORAL | Status: DC
  Administered 2021-11-11 – 2021-11-13 (×5): 1 via ORAL
  Filled 2021-11-11 (×8): qty 1

## 2021-11-11 MED ORDER — ONDANSETRON HCL 4 MG/2ML IJ SOLN: 4.0000 mg | Freq: Four times a day (QID) | INTRAMUSCULAR | Status: DC | PRN

## 2021-11-11 MED ORDER — ALBUTEROL SULFATE (2.5 MG/3ML) 0.083% IN NEBU: 2.5000 mg | INHALATION_SOLUTION | Freq: Four times a day (QID) | RESPIRATORY_TRACT | Status: DC | PRN

## 2021-11-11 MED ORDER — TEMAZEPAM 15 MG PO CAPS
30.0000 mg | ORAL_CAPSULE | Freq: Every evening | ORAL | Status: DC | PRN
  Administered 2021-11-11 – 2021-11-12 (×2): 30 mg via ORAL
  Filled 2021-11-11 (×2): qty 2
  Filled 2021-11-11: qty 1

## 2021-11-11 MED ORDER — HYDRALAZINE HCL 20 MG/ML IJ SOLN: 10.0000 mg | INTRAMUSCULAR | Status: DC | PRN

## 2021-11-11 MED ORDER — OXYCODONE HCL 5 MG PO TABS
5.0000 mg | ORAL_TABLET | ORAL | Status: DC | PRN
  Administered 2021-11-11 – 2021-11-13 (×7): 5 mg via ORAL
  Filled 2021-11-11 (×7): qty 1

## 2021-11-11 MED ORDER — TRAZODONE HCL 50 MG PO TABS
50.0000 mg | ORAL_TABLET | Freq: Every evening | ORAL | Status: DC | PRN
  Administered 2021-11-12: 50 mg via ORAL
  Filled 2021-11-11: qty 1

## 2021-11-11 MED ORDER — IPRATROPIUM-ALBUTEROL 0.5-2.5 (3) MG/3ML IN SOLN
3.0000 mL | Freq: Four times a day (QID) | RESPIRATORY_TRACT | Status: DC
  Administered 2021-11-11 – 2021-11-12 (×4): 3 mL via RESPIRATORY_TRACT
  Filled 2021-11-11 (×4): qty 3

## 2021-11-11 MED ORDER — CITALOPRAM HYDROBROMIDE 20 MG PO TABS
20.0000 mg | ORAL_TABLET | Freq: Every day | ORAL | Status: DC
  Administered 2021-11-11 – 2021-11-13 (×3): 20 mg via ORAL
  Filled 2021-11-11: qty 1
  Filled 2021-11-11: qty 2
  Filled 2021-11-11: qty 1

## 2021-11-11 MED ORDER — TIZANIDINE HCL 4 MG PO TABS
4.0000 mg | ORAL_TABLET | Freq: Three times a day (TID) | ORAL | Status: DC | PRN
  Administered 2021-11-11: 8 mg via ORAL
  Administered 2021-11-11 – 2021-11-13 (×3): 4 mg via ORAL
  Filled 2021-11-11: qty 1
  Filled 2021-11-11: qty 2
  Filled 2021-11-11 (×2): qty 1

## 2021-11-11 MED ORDER — CHLORHEXIDINE GLUCONATE CLOTH 2 % EX PADS
6.0000 | MEDICATED_PAD | Freq: Every day | CUTANEOUS | Status: DC
  Administered 2021-11-11 – 2021-11-13 (×3): 6 via TOPICAL

## 2021-11-11 MED ORDER — SODIUM CHLORIDE 0.9 % IN NEBU
3.0000 mL | INHALATION_SOLUTION | Freq: Three times a day (TID) | RESPIRATORY_TRACT | Status: DC
  Administered 2021-11-11: 3 mL via RESPIRATORY_TRACT
  Filled 2021-11-11 (×2): qty 3

## 2021-11-11 MED ORDER — MIRTAZAPINE 15 MG PO TABS
15.0000 mg | ORAL_TABLET | Freq: Every day | ORAL | Status: DC
  Administered 2021-11-11 – 2021-11-12 (×3): 15 mg via ORAL
  Filled 2021-11-11 (×4): qty 1

## 2021-11-11 MED ORDER — POLYETHYLENE GLYCOL 3350 17 G PO PACK: 17.0000 g | PACK | Freq: Every day | ORAL | Status: DC | PRN

## 2021-11-11 MED ORDER — METOPROLOL TARTRATE 5 MG/5ML IV SOLN: 5.0000 mg | INTRAVENOUS | Status: DC | PRN

## 2021-11-11 MED ORDER — SODIUM CHLORIDE 0.9 % IV SOLN
1.0000 g | Freq: Two times a day (BID) | INTRAVENOUS | Status: DC
  Administered 2021-11-11 – 2021-11-13 (×5): 1 g via INTRAVENOUS
  Filled 2021-11-11 (×8): qty 20

## 2021-11-11 NOTE — Progress Notes (Signed)
PROGRESS NOTE    Renee Waters  BPZ:[id number] DOB: [birth year] DOA: 11/10/2021 PCP: [name], MD   Brief Narrative:  76 year old with history of lupus, HTN, GERD, chronic pain, insomnia, depression, anxiety, CKD stage IIIb, myeloproliferative neoplasm admitted for chronic cough and shortness of breath.  Cough has been ongoing for 4 weeks treated with Augmentin but developed diarrhea.  Cultures from 9/19 grew Pseudomonas and stenotrophomonas.  CT chest showed bilateral cylindrical bronchiectasis, emphysema, bronchomalacia.  Started on IV meropenem and p.o. Bactrim.  Pulmonary team was consulted.  COVID and flu were negative.   Assessment & Plan:  Principal Problem:   Pneumonia Active Problems:   Depression   Labile hypertension   GERD (gastroesophageal reflux disease)   Chronic midline low back pain with sciatica   Insomnia secondary to depression with anxiety   Systemic lupus (HCC)   Anemia, normocytic normochromic   CKD (chronic kidney disease) stage 3, GFR 30-59 ml/min (HCC)   Anxiety   MPN (myeloproliferative neoplasm) (HCC)   Acute respiratory distress secondary to bronchopneumonia, immunocompromised -Patient has failed outpatient treatment with Augmentin.  Cultures from 9/19 grew Pseudomonas, stenotrophomonas.  Will check procalcitonin, BNP - On IV meropenem and p.o. Bactrim - Bronchodilators, I-S/flutter valve - Flu/COVID-negative.  Check respiratory panel   Diarrhea -Due to recent antibiotic use.  But states this is resolved now  Labile hypertension -Currently on metoprolol 25 mg twice daily.  Pending home med rec - IV as needed    Anemia Thrombocytopenia Myeloproliferative neoplasm -one biopsy consistent with granulocytic proliferation: CMML versus myeloproliferative disorder; with further evaluation more suggestive of myeloproliferative neoplasm.  Known splenomegaly.  Jakafi on hold due to active infection.  Follows with outpatient [name] -  Continue allopurinol, oxycodone   GERD -Protonix twice daily   Insomnia, depression, anxiety -Celexa, Remeron   CKD 3B secondary to lupus -Baseline creatinine 1.2.  Not on any lupus medications?   Chronic pain -Continue home oxycodone.    DVT prophylaxis: SCDs Start: 11/10/21 2332 Code Status: Full code Family Communication: Family at bedside  Cont hospital stay due to unresolving outpatient PNA despite of Abx use.     Subjective: Seen and examined at bedside, appears comfortable.  Denies any diarrhea. Tells me her issues have been ongoing for past several weeks   Examination:  General exam: Appears calm and comfortable  Respiratory system: Diminished breath sounds in the bases Cardiovascular system: S1 & S2 heard, RRR. No JVD, murmurs, rubs, gallops or clicks. No pedal edema. Gastrointestinal system: Abdomen is nondistended, soft and nontender. No organomegaly or masses felt. Normal bowel sounds heard. Central nervous system: Alert and oriented. No focal neurological deficits. Extremities: Symmetric 5 x 5 power. Skin: No rashes, lesions or ulcers Psychiatry: Judgement and insight appear normal. Mood & affect appropriate.     Objective: Vitals:   11/11/21 0600 11/11/21 0615 11/11/21 0630 11/11/21 0645  BP: (!) 168/89 (!) 168/94 (!) 166/88 (!) 159/86  Pulse: (!) 102 (!) 105 (!) 101 95  Resp: '12 18 15 12  '$ Temp:      TempSrc:      SpO2: 95% 96% 93% 95%    Intake/Output Summary (Last 24 hours) at 11/11/2021 0730 Last data filed at 11/11/2021 0021 Gross per 24 hour  Intake 100.9 ml  Output --  Net 100.9 ml   There were no vitals filed for this visit.   Data Reviewed:   CBC: Recent Labs  Lab 11/04/21 1202 11/10/21 1830 11/11/21 0410  WBC 154.9* 123.9* 132.6*  NEUTROABS 117.7* 78.1*  --   HGB 8.6* 8.2* 8.1*  HCT 27.0* 24.9* 25.1*  MCV 93.1 92.2 91.9  PLT 103* 77* 85*   Basic Metabolic Panel: Recent Labs  Lab 11/10/21 1830  NA 141  K 3.9  CL  109  CO2 23  GLUCOSE 118*  BUN 23  CREATININE 1.18*  CALCIUM 8.6*   GFR: Estimated Creatinine Clearance: 37.1 mL/min (A) (by C-G formula based on SCr of 1.18 mg/dL (H)). Liver Function Tests: Recent Labs  Lab 11/10/21 1830  AST 37  ALT 25  ALKPHOS 160*  BILITOT 0.6  PROT 7.3  ALBUMIN 3.7   No results for input(s): "LIPASE", "AMYLASE" in the last 168 hours. No results for input(s): "AMMONIA" in the last 168 hours. Coagulation Profile: No results for input(s): "INR", "PROTIME" in the last 168 hours. Cardiac Enzymes: No results for input(s): "CKTOTAL", "CKMB", "CKMBINDEX", "TROPONINI" in the last 168 hours. BNP (last 3 results) No results for input(s): "PROBNP" in the last 8760 hours. HbA1C: No results for input(s): "HGBA1C" in the last 72 hours. CBG: No results for input(s): "GLUCAP" in the last 168 hours. Lipid Profile: No results for input(s): "CHOL", "HDL", "LDLCALC", "TRIG", "CHOLHDL", "LDLDIRECT" in the last 72 hours. Thyroid Function Tests: No results for input(s): "TSH", "T4TOTAL", "FREET4", "T3FREE", "THYROIDAB" in the last 72 hours. Anemia Panel: No results for input(s): "VITAMINB12", "FOLATE", "FERRITIN", "TIBC", "IRON", "RETICCTPCT" in the last 72 hours. Sepsis Labs: Recent Labs  Lab 11/10/21 1830 11/10/21 2104  LATICACIDVEN 1.6 1.0    Recent Results (from the past 240 hour(s))  Blood culture (routine x 2)     Status: None (Preliminary result)   Collection Time: 11/10/21  6:30 PM   Specimen: BLOOD  Result Value Ref Range Status   Specimen Description BLOOD SITE NOT SPECIFIED  Final   Special Requests   Final    BOTTLES DRAWN AEROBIC AND ANAEROBIC Blood Culture adequate volume   Culture   Final    NO GROWTH < 12 HOURS Performed at [facility], [place] [address] [place], [place] [address]    Report Status PENDING  Incomplete  Resp Panel by RT-PCR (Flu A&B, Covid) Anterior Nasal Swab     Status: None   Collection Time: 11/10/21  9:11 PM    Specimen: Anterior Nasal Swab  Result Value Ref Range Status   SARS Coronavirus 2 by RT PCR NEGATIVE NEGATIVE Final    Comment: (NOTE) SARS-CoV-2 target nucleic acids are NOT DETECTED.  The SARS-CoV-2 RNA is generally detectable in upper respiratory specimens during the acute phase of infection. The lowest concentration of SARS-CoV-2 viral copies this assay can detect is 138 copies/mL. A negative result does not preclude SARS-Cov-2 infection and should not be used as the sole basis for treatment or other patient management decisions. A negative result may occur with  improper specimen collection/handling, submission of specimen other than nasopharyngeal swab, presence of viral mutation(s) within the areas targeted by this assay, and inadequate number of viral copies(<138 copies/mL). A negative result must be combined with clinical observations, patient history, and epidemiological information. The expected result is Negative.  Fact Sheet for Patients:  EntrepreneurPulse.com.au  Fact Sheet for Healthcare Providers:  IncredibleEmployment.be  This test is no t yet approved or cleared by the [place] FDA and  has been authorized for detection and/or diagnosis of SARS-CoV-2 by FDA under an Emergency Use Authorization (EUA). This EUA will remain  in effect (meaning this test can be used) for the  duration of the COVID-19 declaration under Section 564(b)(1) of the Act, 21 U.S.C.section 360bbb-3(b)(1), unless the authorization is terminated  or revoked sooner.       Influenza A by PCR NEGATIVE NEGATIVE Final   Influenza B by PCR NEGATIVE NEGATIVE Final    Comment: (NOTE) The Xpert Xpress SARS-CoV-2/FLU/RSV plus assay is intended as an aid in the diagnosis of influenza from Nasopharyngeal swab specimens and should not be used as a sole basis for treatment. Nasal washings and aspirates are unacceptable for Xpert Xpress  SARS-CoV-2/FLU/RSV testing.  Fact Sheet for Patients: EntrepreneurPulse.com.au  Fact Sheet for Healthcare Providers: IncredibleEmployment.be  This test is not yet approved or cleared by the [place] FDA and has been authorized for detection and/or diagnosis of SARS-CoV-2 by FDA under an Emergency Use Authorization (EUA). This EUA will remain in effect (meaning this test can be used) for the duration of the COVID-19 declaration under Section 564(b)(1) of the Act, 21 U.S.C. section 360bbb-3(b)(1), unless the authorization is terminated or revoked.  Performed at [facility], [place] [address] [place], [place] [address]          Radiology Studies: CT Chest High Resolution  Result Date: 11/10/2021 CLINICAL DATA:  Cough, chronic/persisting > 8 weeks, history of myeloproliferative disorder EXAM: CT CHEST WITHOUT CONTRAST TECHNIQUE: Multidetector CT imaging of the chest was performed following the standard protocol without intravenous contrast. High resolution imaging of the lungs, as well as inspiratory and expiratory imaging, was performed. RADIATION DOSE REDUCTION: This exam was performed according to the departmental dose-optimization program which includes automated exposure control, adjustment of the mA and/or kV according to patient size and/or use of iterative reconstruction technique. COMPARISON:  11/10/2021, 09/01/2021 FINDINGS: Cardiovascular: Unenhanced imaging of the heart demonstrates borderline cardiomegaly without pericardial effusion. Normal caliber of the thoracic aorta. There is atherosclerosis of the aorta and coronary vasculature unchanged. Right chest wall port via internal jugular approach tip within the atriocaval junction. Evaluation of the vascular lumen is limited without IV contrast. Mediastinum/Nodes: No enlarged mediastinal, hilar, or axillary lymph nodes. Thyroid gland, trachea, and esophagus demonstrate no significant  findings. Lungs/Pleura: Stable upper lobe predominant emphysema. Subpleural linear densities within the dependent lower lobes likely hypoventilatory in nature. No evidence of fibrosis. There is bilateral cylindrical bronchiectasis, greatest in the lower lobes. No bronchial wall thickening. The central airways are patent. No air trapping on expiration. Decreased caliber of the tracheobronchial tree during expiration may reflect an element of tracheobronchomalacia. Upper Abdomen: Stable splenomegaly. Otherwise no acute upper abdominal process. Musculoskeletal: There are no acute or destructive bony lesions. Reconstructed images demonstrate no additional findings. IMPRESSION: 1. Bilateral cylindrical bronchiectasis. No evidence of bronchial wall thickening. 2. Upper lobe predominant emphysema. 3. Subpleural hypoventilatory changes at the lung bases. No evidence of underlying fibrosis. 4. Decreased caliber of the tracheobronchial tree during expiration, which could reflect an element of tracheal bronchomalacia. 5. Aortic Atherosclerosis (ICD10-I70.0) and Emphysema (ICD10-J43.9). 6. Splenomegaly, compatible with known history of myeloproliferative disorder. Electronically Signed   By: Randa Ngo M.D.   On: 11/10/2021 21:55   DG Chest 2 View  Result Date: 11/10/2021 CLINICAL DATA:  Shortness of breath and chest pains for 3 months. EXAM: CHEST - 2 VIEW COMPARISON:  Chest two views 10/19/2021, AP chest 09/01/2021 FINDINGS: Redemonstration of right chest wall porta catheter with tip overlying the med severe vena cava. Cardiac silhouette is at the upper limits of normal size. Mediastinal contours are within normal limits. Mild-to-moderate calcification within the aortic arch. The  lungs are clear. No pleural effusion or pneumothorax. Mild multilevel degenerative disc changes of the thoracic spine. IMPRESSION: No active cardiopulmonary disease. Electronically Signed   By: Yvonne Kendall M.D.   On: 11/10/2021 14:58         Scheduled Meds:  allopurinol  300 mg Oral Daily   citalopram  20 mg Oral Daily   metoprolol tartrate  25 mg Oral BID   mirtazapine  15 mg Oral QHS   pantoprazole  40 mg Oral BID   sodium chloride flush  3 mL Intravenous Q12H   sulfamethoxazole-trimethoprim  1 tablet Oral Q12H   Continuous Infusions:   LOS: 0 days   Time spent= 35 mins    Crescencio Jozwiak Arsenio Loader, MD [facility]  If 7PM-7AM, please contact night-coverage  11/11/2021, 7:30 AM

## 2021-11-11 NOTE — Consult Note (Signed)
NAME:  Renee Waters, MRN:  [id number], DOB:  07/15/1945, LOS: 0 ADMISSION DATE:  11/10/2021, CONSULTATION DATE: 11/11/2021 REFERRING MD: [facility], CHIEF COMPLAINT: Purulent sputum  History of Present Illness:  76 year old female who quit smoking in 2010 and has had 3 months of increasing shortness of breath chronic cough with gray sputum he has been seen by Dr. Christinia Gully of pulmonary and treated for postnasal drip with antimicrobial therapy.  She does have a complicated past medical history with CT revealing bronchomalacia, bronchiectasis right Port-A-Cath in place but without overt signs of infection.She has been treated with Augmentin with the onset of liquid diarrhea. She presents to [facility] with increasing shortness of breath, productive cough of bright gray sputum although this is complicated by her immunosuppressed history from myeloproliferative neoplasm, lupus with chronic steroid use with a plethora of other health issues that are well documented below.  She is notable to be positive for Pseudomonas and stenotrophomonas and now is on meropenem and Bactrim.  We recommended pulmonary toilet, continuation of antimicrobial therapy and further follow-up with pulmonary in the future.  Pertinent  Medical History   Past Medical History:  Diagnosis Date   Acute cholangitis    Allergy    Anemia    Anxiety    Arthritis    Phreesia 03/18/2020   Barrett's esophagus    Cataract    Chronic back pain    Chronic neck pain    CKD (chronic kidney disease) stage 3, GFR 30-59 ml/min ([place]) 10/29/2020   Depression    Genital herpes    GERD (gastroesophageal reflux disease)    H/O degenerative disc disease    History of blood transfusion    Hypertension    Insomnia    Lupus (systemic lupus erythematosus) ([place])    Neuromuscular disorder ([place])    Osteoarthritis    S/P colonoscopy June 2005   normal, no polyps   S/P endoscopy June 2005, Oct 2009   2005: short-segment Barrett's, 2009:  short-segment Barrett's   Upper respiratory tract infection 07/09/2020   UTI (lower urinary tract infection) 11/2012     Significant Hospital Events: Including procedures, antibiotic start and stop dates in addition to other pertinent events     Interim History / Subjective:  Currently on room air in no acute distress  Objective   Blood pressure (!) 159/86, pulse 95, temperature 99.7 F (37.6 C), temperature source Oral, resp. rate 12, SpO2 95 %.        Intake/Output Summary (Last 24 hours) at 11/11/2021 0901 Last data filed at 11/11/2021 0021 Gross per 24 hour  Intake 100.9 ml  Output --  Net 100.9 ml   There were no vitals filed for this visit.  Examination: General: Well-nourished well-developed female no acute distress on room air HENT: No JVD or lymphadenopathy is appreciated Lungs: Diminished breath sounds throughout mild Cardiovascular: Heart sounds are regular Abdomen: Soft nontender positive bowel sounds Extremities: Warm and dry without edema Neuro: Grossly intact without focal defect GU: Voids  Resolved Hospital Problem list     Assessment & Plan:  Chronic cough with sputum positive for Pseudomonas and stenotrophomonas complicated by underlying bronchomalacia, bronchiectasis and emphysema and is immunocompromised.  Along with lupus. Agree with meropenem and Bactrim Add pulmonary toilet with flutter valve incentive spirometer As needed bronchodilators to help with sputum removal Plus or minus chest physiotherapy Consider swallow eval but does not indicate any signs of aspiration Outpatient follow-up  Recent onset of diarrhea while on Augmentin C.  difficile is pending  History of myeloproliferative neoplasm Continue to monitor  Gastroesophageal reflux PPI  Chronic anxiety depression Anxiolytics Antidepressants Best Practice (right click and "Reselect all SmartList Selections" daily)   Diet/type: Regular consistency (see orders) DVT prophylaxis:   GI prophylaxis: PPI Lines: Central line Foley:  Yes, and it is still needed Code Status:  full code Last date of multidisciplinary goals of care discussion [TBD]  Labs   CBC: Recent Labs  Lab 11/04/21 1202 11/10/21 1830 11/11/21 0410  WBC 154.9* 123.9* 132.6*  NEUTROABS 117.7* 78.1*  --   HGB 8.6* 8.2* 8.1*  HCT 27.0* 24.9* 25.1*  MCV 93.1 92.2 91.9  PLT 103* 77* 85*    Basic Metabolic Panel: Recent Labs  Lab 11/10/21 1830  NA 141  K 3.9  CL 109  CO2 23  GLUCOSE 118*  BUN 23  CREATININE 1.18*  CALCIUM 8.6*   GFR: Estimated Creatinine Clearance: 37.1 mL/min (A) (by C-G formula based on SCr of 1.18 mg/dL (H)). Recent Labs  Lab 11/04/21 1202 11/10/21 1830 11/10/21 2104 11/11/21 0410  WBC 154.9* 123.9*  --  132.6*  LATICACIDVEN  --  1.6 1.0  --     Liver Function Tests: Recent Labs  Lab 11/10/21 1830  AST 37  ALT 25  ALKPHOS 160*  BILITOT 0.6  PROT 7.3  ALBUMIN 3.7   No results for input(s): "LIPASE", "AMYLASE" in the last 168 hours. No results for input(s): "AMMONIA" in the last 168 hours.  ABG No results found for: "PHART", "PCO2ART", "PO2ART", "HCO3", "TCO2", "ACIDBASEDEF", "O2SAT"   Coagulation Profile: No results for input(s): "INR", "PROTIME" in the last 168 hours.  Cardiac Enzymes: No results for input(s): "CKTOTAL", "CKMB", "CKMBINDEX", "TROPONINI" in the last 168 hours.  HbA1C: Hgb A1c MFr Bld  Date/Time Value Ref Range Status  11/03/2014 10:03 AM 6.0 (H) <5.7 % Final    Comment:                                                                           According to the ADA Clinical Practice Recommendations for 2011, when HbA1c is used as a screening test:     >=6.5%   Diagnostic of Diabetes Mellitus            (if abnormal result is confirmed)   5.7-6.4%   Increased risk of developing Diabetes Mellitus   References:Diagnosis and Classification of Diabetes Mellitus,Diabetes PZWC,5852,77(OEUMP 1):S62-S69 and Standards of Medical  Care in         Diabetes - 2011,Diabetes NTIR,4431,54 (Suppl 1):S11-S61.     07/16/2013 10:15 AM 5.7 (H) <5.7 % Final    Comment:                                                                           According to the ADA Clinical Practice Recommendations for 2011, when HbA1c is used as a screening test:     >=6.5%   Diagnostic of  Diabetes Mellitus            (if abnormal result is confirmed)   5.7-6.4%   Increased risk of developing Diabetes Mellitus   References:Diagnosis and Classification of Diabetes Mellitus,Diabetes JGGE,3662,94(TMLYY 1):S62-S69 and Standards of Medical Care in         Diabetes - 2011,Diabetes TKPT,4656,81 (Suppl 1):S11-S61.      CBG: No results for input(s): "GLUCAP" in the last 168 hours.  Review of Systems:   10 point review of system taken, please see HPI for positives and negatives. + Chronic cough with gray sputum for approximately last 2 months Intermittent shortness of breath Denies chest pain  Past Medical History:  She,  has a past medical history of Acute cholangitis, Allergy, Anemia, Anxiety, Arthritis, Barrett's esophagus, Cataract, Chronic back pain, Chronic neck pain, CKD (chronic kidney disease) stage 3, GFR 30-59 ml/min ([facility]) (10/29/2020), Depression, Genital herpes, GERD (gastroesophageal reflux disease), H/O degenerative disc disease, History of blood transfusion, Hypertension, Insomnia, Lupus (systemic lupus erythematosus) ([place]), Neuromuscular disorder ([place]), Osteoarthritis, S/P colonoscopy (June 2005), S/P endoscopy (June 2005, Oct 2009), Upper respiratory tract infection (07/09/2020), and UTI (lower urinary tract infection) (11/2012).   Surgical History:   Past Surgical History:  Procedure Laterality Date   ABDOMINAL HYSTERECTOMY     BACK SURGERY     BIOPSY N/A 03/20/2014   Procedure: BIOPSY;  Surgeon: Daneil Dolin, MD;  Location: [facility];  Service: Endoscopy;  Laterality: N/A;   BIOPSY  09/14/2015   Procedure: BIOPSY;  Surgeon:  Daneil Dolin, MD;  Location: [facility];  Service: Endoscopy;;  esophageal and gastric   BIOPSY  07/23/2019   Procedure: BIOPSY;  Surgeon: Daneil Dolin, MD;  Location: [facility];  Service: Endoscopy;;  esophageal    CARPAL TUNNEL RELEASE Left 2013   cervical disectomy  2002   CESAREAN SECTION N/A    Phreesia 03/18/2020   CHOLECYSTECTOMY     with lysis of adhesions for sbo; "ruptured gallbladder".   COLONOSCOPY  11/09/2011   RMR: Melanosis coli   COLONOSCOPY WITH PROPOFOL N/A 09/14/2015   Dr. Gala Romney: diverticulosis, 82m TA removed. next TCS 09/2020.    COLONOSCOPY WITH PROPOFOL N/A 04/30/2020   Procedure: COLONOSCOPY WITH PROPOFOL;  Surgeon: RDaneil Dolin MD;  Location: [facility];  Service: Endoscopy;  Laterality: N/A;  PM (ASA 3)   DENTAL SURGERY  11/2015   multiple tooth extraction   ESOPHAGOGASTRODUODENOSCOPY  11/29/2007   salmon-colored  tongue   longest stable at  3 cm, distal esophagus as described previously status post biopsy/ Hiatal hernia, otherwise normal stomach D1 and D2   ESOPHAGOGASTRODUODENOSCOPY  01/06/11   short segment Barrett's esophagus s/p bx/Hiatal hernia   ESOPHAGOGASTRODUODENOSCOPY (EGD) WITH PROPOFOL N/A 03/20/2014   REXN:TZGYFVCBdistal esophagus short segment barrett's, bx with no dysplasia. next egd in 03/2017   ESOPHAGOGASTRODUODENOSCOPY (EGD) WITH PROPOFOL N/A 09/14/2015   Dr. RGala Romney Barrett's without dysplasia, gastritis benign bx, hiatal hernia. next EGD 09/2018.   ESOPHAGOGASTRODUODENOSCOPY (EGD) WITH PROPOFOL N/A 07/23/2019   Procedure: ESOPHAGOGASTRODUODENOSCOPY (EGD) WITH PROPOFOL;  Surgeon: RDaneil Dolin MD;  [facility]: [facility];  Service: Endoscopy;  Laterality: N/A;  3:00pm   EYE SURGERY N/A    Phreesia 03/18/2020   HERNIA REPAIR Right 07/2010   Dr. BZada Girt  IR IMAGING GUIDED PORT INSERTION  02/09/2021   JOINT REPLACEMENT     [facility]  POLYPECTOMY  09/14/2015   Procedure: POLYPECTOMY;  Surgeon:  Daneil Dolin, MD;  Location: [facility];  Service: Endoscopy;;  ascending colon   right hip replacement  07/2010   went back in sept 2012 to fix   SHOULDER ARTHROSCOPY  2008   left   SPINE SURGERY N/A    Phreesia 03/18/2020   TOTAL HIP REVISION Right 12/17/2012   Procedure: RIGHT TOTAL HIP REVISION;  Surgeon: Mauri Pole, MD;  Location: [facility];  Service: Orthopedics;  Laterality: Right;   WRIST SURGERY Right 2011   open reduction right wrist.     Social History:   reports that she quit smoking about 18 years ago. Her smoking use included cigarettes. She has a 6.25 pack-year smoking history. She has never used smokeless tobacco. She reports that she does not drink alcohol and does not use drugs.   Family History:  Her family history includes Hypertension in her mother; Stroke in her mother. There is no history of Colon cancer, Anesthesia problems, Hypotension, Malignant hyperthermia, Pseudochol deficiency, Gastric cancer, or Esophageal cancer.   Allergies Allergies  Allergen Reactions   Bee Venom Swelling and Hives   Norvasc [Amlodipine] Other (See Comments)    Hair loss   Tylenol [Acetaminophen] Itching   Red Dye Itching   Percocet [Oxycodone-Acetaminophen] Rash and Other (See Comments)    Pt states, "this gives her a rash, but at home she takes oxycodone for pain relief"    Tyloxapol Itching and Rash     Home Medications  Prior to Admission medications   Medication Sig Start Date End Date Taking? Authorizing Provider  albuterol (VENTOLIN HFA) 108 (90 Base) MCG/ACT inhaler Inhale 2 puffs into the lungs every 6 (six) hours as needed for wheezing or shortness of breath. 10/28/21  Yes Lindell Spar, MD  allopurinol (ZYLOPRIM) 300 MG tablet Take 1 tablet (300 mg total) by mouth daily. 10/04/21  Yes Derek Jack, MD  budesonide-formoterol [facility]) 80-4.5 MCG/ACT inhaler Take 2 puffs first thing in am and then another 2 puffs about 12 hours later. 11/01/21  Yes Tanda Rockers, MD  citalopram (CELEXA) 20 MG tablet Take 1 tablet by mouth once daily 08/30/21  Yes [name], MD  metoprolol tartrate (LOPRESSOR) 25 MG tablet Take 1 tablet (25 mg total) by mouth 2 (two) times daily. 10/21/21  Yes Johnson, Clanford L, MD  mirtazapine (REMERON) 15 MG tablet Take 1 tablet (15 mg total) by mouth at bedtime. 06/07/21  Yes [name], MD  Multiple Vitamin (MULTIVITAMIN WITH MINERALS) TABS tablet Take 1 tablet by mouth every other day.   Yes [provider]  oxyCODONE (OXY IR/ROXICODONE) 5 MG immediate release tablet Take 5 mg by mouth every 4 (four) hours as needed for moderate pain, severe pain or breakthrough pain. 04/22/21  Yes [provider]  pantoprazole (PROTONIX) 40 MG tablet Take 30- 60 min before your first and last meals of the day Patient taking differently: Take 40 mg by mouth 2 (two) times daily before a meal. 11/01/21  Yes Tanda Rockers, MD  potassium chloride (KLOR-CON) 10 MEQ tablet Take 1 tablet by mouth once daily 08/06/21  Yes Derek Jack, MD  ruxolitinib phosphate (JAKAFI) 15 MG tablet Take 1 tablet (15 mg total) by mouth 2 (two) times daily. 10/07/21  Yes Derek Jack, MD  temazepam (RESTORIL) 30 MG capsule Take 1 capsule (30 mg total) by mouth at bedtime as needed for sleep. 03/09/21  Yes [name], MD  tiZANidine (ZANAFLEX) 4 MG tablet Take 2  tablets (8 mg total) by mouth 3 (three) times daily as needed (spasms). Patient taking differently: Take 4-8 mg by mouth 3 (three) times daily as needed for muscle spasms. 09/24/21  Yes [name], MD  acyclovir (ZOVIRAX) 800 MG tablet Take 1 tablet (800 mg total) by mouth 5 (five) times daily. 04/21/21   [name], MD  acyclovir ointment (ZOVIRAX) 5 % Apply 1 application. topically every 3 (three) hours. 04/21/21   [name], MD  amoxicillin-clavulanate (AUGMENTIN) 875-125 MG tablet Take 1 tablet by mouth 2 (two) times  daily. Patient not taking: Reported on 11/11/2021 11/01/21   Tanda Rockers, MD  EPINEPHRINE 0.3 mg/0.3 mL IJ SOAJ injection INJECT 0.3 MLS INTO MUSCLE ONCE AS NEEDED Patient taking differently: Inject 0.3 mg into the muscle as needed for anaphylaxis. 10/04/16   [name], MD  furosemide (LASIX) 20 MG tablet Take 1 tablet (20 mg total) by mouth daily as needed. 09/06/21   Derek Jack, MD  gabapentin (NEURONTIN) 300 MG capsule Take one capsule by mouth once daily, as needed, for pain Patient taking differently: Take 300 mg by mouth daily as needed (Pain). 03/30/21   [name], MD  lidocaine-prilocaine (EMLA) cream Apply 1 application. topically as needed. Patient taking differently: Apply 1 application  topically as needed (Access to port and pain). 06/18/21   Derek Jack, MD     Critical care time: Ferol Luz Layken Beg ACNP Acute Care Nurse Practitioner [place] Please consult Amion 11/11/2021, 9:01 AM

## 2021-11-11 NOTE — ED Notes (Signed)
Patient awake and alert this morning, eating breakfast, no s/s of distress, currently talking to a pharmacy tech, will continue to monitor.

## 2021-11-11 NOTE — Progress Notes (Signed)
Pharmacy Antibiotic Note  Renee Waters is a 76 y.o. female admitted on 11/10/2021 with pneumonia.  Pharmacy has been consulted for meropenem dosing.  Patient presented with worsening SOB and productive cough. She has been treated outpatient with Augmentin. Previous cultures positive for pseudomonas and stenotrophomonas. She is being treated with Bactrim DS 1 tablet every 12 hours in combo with meropenem.  Plan: Initiate meropenem 1 g every 12 hours   Monitor renal function, liver function, CBC, s/sx of infection. F/U pulmonology recommendations and ability to narrow.  Temp (24hrs), Avg:98.7 F (37.1 C), Min:98.1 F (36.7 C), Max:99.7 F (37.6 C)  Recent Labs  Lab 11/04/21 1202 11/10/21 1830 11/10/21 2104 11/11/21 0410  WBC 154.9* 123.9*  --  132.6*  CREATININE  --  1.18*  --  1.24*  LATICACIDVEN  --  1.6 1.0  --     Estimated Creatinine Clearance: 35.3 mL/min (A) (by C-G formula based on SCr of 1.24 mg/dL (H)).    Allergies  Allergen Reactions   Bee Venom Swelling and Hives   Norvasc [Amlodipine] Other (See Comments)    Hair loss   Tylenol [Acetaminophen] Itching   Red Dye Itching   Percocet [Oxycodone-Acetaminophen] Rash and Other (See Comments)    Pt states, "this gives her a rash, but at home she takes oxycodone for pain relief"    Tyloxapol Itching and Rash    Antimicrobials this admission: Bactrim DS every 12 hours 10/5 >   Microbiology results: 10/4 BCx: collected 9/19 Sputum: pseudomonas and stenotrophomonas  Thank you for allowing pharmacy to be a part of this patient's care.  Sinda Du, PharmD Candidate 11/11/2021 11:45 AM

## 2021-11-12 ENCOUNTER — Other Ambulatory Visit (HOSPITAL_COMMUNITY): Payer: Self-pay

## 2021-11-12 ENCOUNTER — Inpatient Hospital Stay: Payer: Medicare Other

## 2021-11-12 ENCOUNTER — Telehealth: Payer: Self-pay | Admitting: Pulmonary Disease

## 2021-11-12 DIAGNOSIS — J151 Pneumonia due to Pseudomonas: Secondary | ICD-10-CM | POA: Diagnosis not present

## 2021-11-12 DIAGNOSIS — D649 Anemia, unspecified: Secondary | ICD-10-CM | POA: Diagnosis not present

## 2021-11-12 DIAGNOSIS — F419 Anxiety disorder, unspecified: Secondary | ICD-10-CM | POA: Diagnosis not present

## 2021-11-12 DIAGNOSIS — J479 Bronchiectasis, uncomplicated: Secondary | ICD-10-CM | POA: Diagnosis not present

## 2021-11-12 LAB — PREPARE RBC (CROSSMATCH)

## 2021-11-12 LAB — CBC
HCT: 23.3 % — ABNORMAL LOW (ref 36.0–46.0)
Hemoglobin: 7.5 g/dL — ABNORMAL LOW (ref 12.0–15.0)
MCH: 29.8 pg (ref 26.0–34.0)
MCHC: 32.2 g/dL (ref 30.0–36.0)
MCV: 92.5 fL (ref 80.0–100.0)
Platelets: 70 10*3/uL — ABNORMAL LOW (ref 150–400)
RBC: 2.52 MIL/uL — ABNORMAL LOW (ref 3.87–5.11)
RDW: 20.5 % — ABNORMAL HIGH (ref 11.5–15.5)
WBC: 116.1 10*3/uL (ref 4.0–10.5)
nRBC: 2.6 % — ABNORMAL HIGH (ref 0.0–0.2)

## 2021-11-12 LAB — BASIC METABOLIC PANEL
Anion gap: 9 (ref 5–15)
BUN: 14 mg/dL (ref 8–23)
CO2: 22 mmol/L (ref 22–32)
Calcium: 8.8 mg/dL — ABNORMAL LOW (ref 8.9–10.3)
Chloride: 108 mmol/L (ref 98–111)
Creatinine, Ser: 1.47 mg/dL — ABNORMAL HIGH (ref 0.44–1.00)
GFR, Estimated: 37 mL/min — ABNORMAL LOW (ref >60)
Glucose, Bld: 88 mg/dL (ref 70–99)
Potassium: 4.1 mmol/L (ref 3.5–5.1)
Sodium: 139 mmol/L (ref 135–145)

## 2021-11-12 LAB — MAGNESIUM: Magnesium: 1.7 mg/dL (ref 1.7–2.4)

## 2021-11-12 LAB — PATHOLOGIST SMEAR REVIEW

## 2021-11-12 MED ORDER — PANTOPRAZOLE SODIUM 40 MG PO TBEC
40.0000 mg | DELAYED_RELEASE_TABLET | Freq: Two times a day (BID) | ORAL | 0 refills | Status: DC
  Filled 2021-11-12: qty 60, 30d supply, fill #0

## 2021-11-12 MED ORDER — SODIUM CHLORIDE 0.9 % IN NEBU
3.0000 mL | INHALATION_SOLUTION | Freq: Three times a day (TID) | RESPIRATORY_TRACT | Status: DC
  Administered 2021-11-12: 3 mL via RESPIRATORY_TRACT
  Filled 2021-11-12 (×3): qty 3

## 2021-11-12 MED ORDER — SULFAMETHOXAZOLE-TRIMETHOPRIM 800-160 MG PO TABS
1.0000 | ORAL_TABLET | Freq: Two times a day (BID) | ORAL | 0 refills | Status: DC
  Filled 2021-11-12: qty 24, 12d supply, fill #0

## 2021-11-12 MED ORDER — SODIUM CHLORIDE 0.9% IV SOLUTION: Freq: Once | INTRAVENOUS | Status: AC

## 2021-11-12 MED ORDER — SULFAMETHOXAZOLE-TRIMETHOPRIM 800-160 MG PO TABS
1.0000 | ORAL_TABLET | Freq: Two times a day (BID) | ORAL | 0 refills | Status: AC
  Filled 2021-11-12: qty 22, 11d supply, fill #0

## 2021-11-12 MED ORDER — MEROPENEM IV (FOR PTA / DISCHARGE USE ONLY): 1.0000 g | Freq: Two times a day (BID) | INTRAVENOUS | 0 refills | Status: AC

## 2021-11-12 MED ORDER — SODIUM CHLORIDE 0.9% FLUSH
10.0000 mL | INTRAVENOUS | Status: DC | PRN
  Administered 2021-11-12: 10 mL

## 2021-11-12 NOTE — TOC Initial Note (Signed)
Transition of Care [facility]) - Initial/Assessment Note    Patient Details  Name: Renee Waters MRN: [id number] Date of Birth: 1945/04/01  Transition of Care [facility]) CM/SW Contact:    Curlene Labrum, RN Phone Number: 11/12/2021, 1:50 PM  Clinical Narrative:                 CM met with the patient at the bedside for transitions of care needs for home.  The patient admitted to the hospital for pneumonia and will need home IV antibiotics.  The patient has an accessed Right chest port-a-cath at this time and will go home with the port accessed and saline locked for home.  The patient will need IV antibiotics in the home.   Home Health - The patient was given Medicare choice and she did not have a preference.  I called Lillia Mountain, RNCM with [facility] and she accepted the patient for RN services.  [facility] RN will continue IV administration teaching at the home - perform dressing changes to the port and change the port access according to policy.  Home Health orders were placed.  IV antibiotics - I called Carolynn Sayers, RNCM with [facility] and she set up IV antibiotics and will follow up with the patient and her family for teaching.  The patient lives with her spouse at the home.  DME - The patient currently has a rollator at the home.  The patient needs nebulizer machine.  The patient did not have a preference regarding DME company when given Medicare choice.  I called [facility] and they delivered a nebulizer to the hospital room today.  CM will continue to follow the patient for discharge needs for home.  Attending physician is aware that the patient will need to discharge home with the port-a-cath accessed for home IV antibiotics infusions.   Expected Discharge Plan: [place] Barriers to Discharge: Continued Medical Work up   Patient Goals and CMS Choice Patient states their goals for this hospitalization and ongoing recovery are:: Get better and return home with home health CMS  Medicare.gov Compare Post Acute Care list provided to:: Patient Choice offered to / list presented to : Patient  Expected Discharge Plan and Services Expected Discharge Plan: [place]   Discharge Planning Services: CM Consult Post Acute Care Choice: Durable Medical Equipment, Home Health Living arrangements for the past 2 months: Single Family Home                 DME Arranged: Nebulizer machine DME Agency: [facility] Date DME Agency Contacted: 11/12/21 Time DME Agency Contacted: 1215 Representative spoke with at DME Agency: Jeanella Anton, [place] with [facility] HH Arranged: RN [place] Agency: Tour manager, [place] ([place]) Date [place]: 11/12/21 Time [place]: 7829 Representative spoke with at [place]: Lillia Mountain, [place] with [facility]  Prior Living Arrangements/Services Living arrangements for the past 2 months: [place] with:: Spouse Patient language and need for interpreter reviewed:: Yes Do you feel safe going back to the place where you live?: Yes      Need for Family Participation in Patient Care: Yes (Comment) Care giver support system in place?: Yes (comment) Current home services: DME (has rollator at home) Criminal Activity/Legal Involvement Pertinent to Current Situation/Hospitalization: No - Comment as needed  Activities of Daily Living Home Assistive Devices/Equipment: Walker (specify type), Cane (specify quad or straight) ADL Screening (condition at time of admission) Patient's cognitive ability adequate to safely complete daily  activities?: Yes Is the patient deaf or have difficulty hearing?: No Does the patient have difficulty seeing, even when wearing glasses/contacts?: No Does the patient have difficulty concentrating, remembering, or making decisions?: No Patient able to express need for assistance with ADLs?: Yes Does the patient have difficulty dressing or bathing?: No Independently performs ADLs?:  No Communication: Independent Dressing (OT): Independent Is this a change from baseline?: Pre-admission baseline Grooming: Independent Feeding: Independent Bathing: Independent Is this a change from baseline?: Pre-admission baseline Toileting: Needs assistance Is this a change from baseline?: Pre-admission baseline In/Out Bed: Needs assistance Is this a change from baseline?: Pre-admission baseline Walks in Home: Needs assistance Is this a change from baseline?: Pre-admission baseline Does the patient have difficulty walking or climbing stairs?: Yes Weakness of Legs: Both Weakness of Arms/Hands: None  Permission Sought/Granted Permission sought to share information with : Case Manager, Family Supports, Customer service manager, PCP Permission granted to share information with : Yes, Verbal Permission Granted     Permission granted to share info w AGENCY: [facility] for IV antibiotics, [facility] RN  Permission granted to share info w Relationship: family contacts     Emotional Assessment Appearance:: Appears stated age Attitude/Demeanor/Rapport: Gracious Affect (typically observed): Accepting Orientation: : Oriented to Self, Oriented to Place, Oriented to  Time, Oriented to Situation Alcohol / Substance Use: Not Applicable Psych Involvement: No (comment)  Admission diagnosis:  Pneumonia [J18.9] Cough, unspecified type [R05.9] Patient Active Problem List   Diagnosis Date Noted   Bronchiectasis assoc with possible tracheomalacia 11/11/2021   Pneumonia 11/10/2021   Chronic cough 11/01/2021   Recurrent UTI 11/01/2021   Generalized weakness 10/19/2021   Sinus bradycardia 10/19/2021   Hypertensive urgency 10/19/2021   Frequent UTI 10/19/2021   Transaminitis 10/19/2021   Thrombocytopenia ([place]) 10/19/2021   Cystitis 10/10/2021   SIRS (systemic inflammatory response syndrome) ([place]) 09/01/2021   Acute respiratory failure with hypoxia ([place]) 09/01/2021   Fall at home,  initial encounter 09/01/2021   Hypotension    Hypomagnesemia 08/27/2021   Symptomatic anemia 08/26/2021   AKI (acute kidney injury) ([place]) 08/25/2021   MPN (myeloproliferative neoplasm) ([place]) 04/21/2021   Anxiety 04/05/2021   Leukocytosis 11/09/2020   CKD (chronic kidney disease) stage 3, GFR 30-59 ml/min ([place]) 10/29/2020   Dermatomycosis 08/02/2020   HSV-2 (herpes simplex virus 2) infection 08/02/2020   Rib pain on left side 03/21/2020   Itching 03/21/2020   Mildly underweight adult 09/19/2019   MDD (major depressive disorder), recurrent episode, moderate ([place]) 10/28/2016   Thoracic spine pain 08/31/2016   Anemia, normocytic normochromic 05/27/2015   Hospital discharge follow-up 03/02/2015   IDA (iron deficiency anemia) 03/02/2015   Unsteady gait 02/25/2015   Mucosal abnormality of esophagus    Systemic lupus ([place]) 10/31/2012   Vitamin D deficiency 03/16/2012   Generalized pain 03/15/2012   Constipation 10/06/2011   Hip pain 06/29/2011   Barrett's esophagus 12/13/2010   Western blot positive HSV2 06/22/2007   Depression 06/22/2007   Labile hypertension 06/22/2007   GERD (gastroesophageal reflux disease) 06/22/2007   DJD (degenerative joint disease) 06/22/2007   NECK PAIN, CHRONIC 06/22/2007   Chronic midline low back pain with sciatica 06/22/2007   Insomnia secondary to depression with anxiety 06/22/2007   Viral infection, unspecified 06/22/2007   PCP:  [name], MD Pharmacy:   [facility] [address] [place] [address] Phone: [phone] Fax: [place], [place] -  [place] [address] Phone: [phone] Fax: [phone] [place] [place] [address] [place] [address] Phone: [phone] Fax: [place] [address] [place]  [address] Phone: [phone] Fax: [phone]     Social Determinants of Health (SDOH) Interventions    Readmission Risk Interventions    11/12/2021    1:49 PM  Readmission Risk Prevention Plan  Transportation Screening Complete  PCP or Specialist Appt within 3-5 Days Complete  HRI or [place] Complete  Social Work Consult for [place] Planning/Counseling Complete  Palliative Care Screening Complete  Medication Review Press photographer) Complete

## 2021-11-12 NOTE — TOC Progression Note (Signed)
1 Discharge meds from [facility] stored in [place]. Please bring white sheet from patient shadow chart to main pharmacy for pick up. - [place] 11/12/2021

## 2021-11-12 NOTE — Progress Notes (Signed)
PROGRESS NOTE    Renee Waters  HGD:[id number] DOB: 05/04/1945 DOA: 11/10/2021 PCP: [name], MD   Brief Narrative:  76 year old with history of lupus, HTN, GERD, chronic pain, insomnia, depression, anxiety, CKD stage IIIb, myeloproliferative neoplasm admitted for chronic cough and shortness of breath.  Cough has been ongoing for 4 weeks treated with Augmentin but developed diarrhea.  Cultures from 9/19 grew Pseudomonas and stenotrophomonas.  CT chest showed bilateral cylindrical bronchiectasis, emphysema, bronchomalacia.  Started on IV meropenem and p.o. Bactrim.  Pulmonary team was consulted.  COVID and flu were negative.  Hemoglobin slowly drifted down therefore 1 unit PRBC transfusion.   Assessment & Plan:  Principal Problem:   Pneumonia Active Problems:   Depression   Labile hypertension   GERD (gastroesophageal reflux disease)   Chronic midline low back pain with sciatica   Insomnia secondary to depression with anxiety   Systemic lupus (HCC)   Anemia, normocytic normochromic   CKD (chronic kidney disease) stage 3, GFR 30-59 ml/min (HCC)   Anxiety   MPN (myeloproliferative neoplasm) (HCC)   Bronchiectasis assoc with possible tracheomalacia   Acute respiratory distress secondary to bronchopneumonia, immunocompromised -Patient has failed outpatient treatment with Augmentin.  Cultures from 9/19 grew Pseudomonas, stenotrophomonas.  ProCal 0.17, BNP 168 - On IV meropenem and p.o. Bactrim, will try and make home arrangements for IV antibiotic - Bronchodilators, I-S/flutter valve - Flu/COVID-negative.  Respiratory panel negative   Diarrhea -Due to recent antibiotic use.  But states this is resolved now  Labile hypertension -Currently on metoprolol 25 mg twice daily.  - IV as needed    Anemia Thrombocytopenia Myeloproliferative neoplasm -one biopsy consistent with granulocytic proliferation: CMML versus myeloproliferative disorder; with further evaluation more  suggestive of myeloproliferative neoplasm.  Known splenomegaly.  [place] on hold due to active infection.  Follows with outpatient [name] -Hemoglobin drifted down, will transfuse 1 unit PRBC.  Gets periodic transfusion outpatient - Continue allopurinol, oxycodone   GERD -Protonix twice daily   Insomnia, depression, anxiety -Celexa, Remeron   CKD 3B secondary to lupus -Baseline creatinine 1.2.  Not on any lupus medications?   Chronic pain -Continue home oxycodone.    DVT prophylaxis: SCDs Start: 11/10/21 2332 Code Status: Full code Family Communication: Daughter at bedside  Awaiting clearance by pulmonary thereafter may need to make arrangements for home health    Subjective: Feeling better this morning, mild cough.  Examination:  Insert physical exam  Chest wall port in place  Objective: Vitals:   11/11/21 2042 11/11/21 2343 11/12/21 0443 11/12/21 0719  BP: 112/71 117/65 (!) 136/125 (!) 146/74  Pulse: 69 80 100 81  Resp: '16 18 18 18  '$ Temp: 98.3 F (36.8 C) 98.6 F (37 C) 99 F (37.2 C) 98.8 F (37.1 C)  TempSrc: Oral Oral Oral Oral  SpO2: 100% 100% 94% 91%  Weight:      Height:        Intake/Output Summary (Last 24 hours) at 11/12/2021 1128 Last data filed at 11/12/2021 9622 Gross per 24 hour  Intake --  Output 800 ml  Net -800 ml   Filed Weights   11/11/21 2042  Weight: 57.4 kg     Data Reviewed:   CBC: Recent Labs  Lab 11/10/21 1830 11/11/21 0410 11/12/21 0502  WBC 123.9* 132.6* 116.1*  NEUTROABS 78.1*  --   --   HGB 8.2* 8.1* 7.5*  HCT 24.9* 25.1* 23.3*  MCV 92.2 91.9 92.5  PLT 77* 85* 70*   Basic Metabolic Panel:  Recent Labs  Lab 11/10/21 1830 11/11/21 0410 11/12/21 0502  NA 141 143 139  K 3.9 3.9 4.1  CL 109 111 108  CO2 '23 24 22  '$ GLUCOSE 118* 84 88  BUN '23 18 14  '$ CREATININE 1.18* 1.24* 1.47*  CALCIUM 8.6* 8.5* 8.8*  MG  --   --  1.7   GFR: Estimated Creatinine Clearance: 29.8 mL/min (A) (by C-G formula based  on SCr of 1.47 mg/dL (H)). Liver Function Tests: Recent Labs  Lab 11/10/21 1830 11/11/21 0410  AST 37 39  ALT 25 22  ALKPHOS 160* 155*  BILITOT 0.6 0.7  PROT 7.3 7.4  ALBUMIN 3.7 3.8   No results for input(s): "LIPASE", "AMYLASE" in the last 168 hours. No results for input(s): "AMMONIA" in the last 168 hours. Coagulation Profile: No results for input(s): "INR", "PROTIME" in the last 168 hours. Cardiac Enzymes: No results for input(s): "CKTOTAL", "CKMB", "CKMBINDEX", "TROPONINI" in the last 168 hours. BNP (last 3 results) No results for input(s): "PROBNP" in the last 8760 hours. HbA1C: No results for input(s): "HGBA1C" in the last 72 hours. CBG: No results for input(s): "GLUCAP" in the last 168 hours. Lipid Profile: No results for input(s): "CHOL", "HDL", "LDLCALC", "TRIG", "CHOLHDL", "LDLDIRECT" in the last 72 hours. Thyroid Function Tests: No results for input(s): "TSH", "T4TOTAL", "FREET4", "T3FREE", "THYROIDAB" in the last 72 hours. Anemia Panel: No results for input(s): "VITAMINB12", "FOLATE", "FERRITIN", "TIBC", "IRON", "RETICCTPCT" in the last 72 hours. Sepsis Labs: Recent Labs  Lab 11/10/21 1830 11/10/21 2104 11/11/21 2112  PROCALCITON  --   --  0.17  LATICACIDVEN 1.6 1.0  --     Recent Results (from the past 240 hour(s))  Blood culture (routine x 2)     Status: None (Preliminary result)   Collection Time: 11/10/21  6:30 PM   Specimen: BLOOD  Result Value Ref Range Status   Specimen Description BLOOD SITE NOT SPECIFIED  Final   Special Requests   Final    BOTTLES DRAWN AEROBIC AND ANAEROBIC Blood Culture adequate volume   Culture   Final    NO GROWTH 2 DAYS Performed at [facility], [address] [place],  [address]    Report Status PENDING  Incomplete  Resp Panel by RT-PCR (Flu A&B, Covid) Anterior Nasal Swab     Status: None   Collection Time: 11/10/21  9:11 PM   Specimen: Anterior Nasal Swab  Result Value Ref Range Status   SARS  Coronavirus 2 by RT PCR NEGATIVE NEGATIVE Final    Comment: (NOTE) SARS-CoV-2 target nucleic acids are NOT DETECTED.  The SARS-CoV-2 RNA is generally detectable in upper respiratory specimens during the acute phase of infection. The lowest concentration of SARS-CoV-2 viral copies this assay can detect is 138 copies/mL. A negative result does not preclude SARS-Cov-2 infection and should not be used as the sole basis for treatment or other patient management decisions. A negative result may occur with  improper specimen collection/handling, submission of specimen other than nasopharyngeal swab, presence of viral mutation(s) within the areas targeted by this assay, and inadequate number of viral copies(<138 copies/mL). A negative result must be combined with clinical observations, patient history, and epidemiological information. The expected result is Negative.  Fact Sheet for Patients:  EntrepreneurPulse.com.au  Fact Sheet for Healthcare Providers:  IncredibleEmployment.be  This test is no t yet approved or cleared by the [place] FDA and  has been authorized for detection and/or diagnosis of SARS-CoV-2 by FDA under an  Emergency Use Authorization (EUA). This EUA will remain  in effect (meaning this test can be used) for the duration of the COVID-19 declaration under Section 564(b)(1) of the Act, 21 U.S.C.section 360bbb-3(b)(1), unless the authorization is terminated  or revoked sooner.       Influenza A by PCR NEGATIVE NEGATIVE Final   Influenza B by PCR NEGATIVE NEGATIVE Final    Comment: (NOTE) The Xpert Xpress SARS-CoV-2/FLU/RSV plus assay is intended as an aid in the diagnosis of influenza from Nasopharyngeal swab specimens and should not be used as a sole basis for treatment. Nasal washings and aspirates are unacceptable for Xpert Xpress SARS-CoV-2/FLU/RSV testing.  Fact Sheet for  Patients: EntrepreneurPulse.com.au  Fact Sheet for Healthcare Providers: IncredibleEmployment.be  This test is not yet approved or cleared by the [place] FDA and has been authorized for detection and/or diagnosis of SARS-CoV-2 by FDA under an Emergency Use Authorization (EUA). This EUA will remain in effect (meaning this test can be used) for the duration of the COVID-19 declaration under Section 564(b)(1) of the Act, 21 U.S.C. section 360bbb-3(b)(1), unless the authorization is terminated or revoked.  Performed at [facility], [place] [address], [place], [place] [address]   Respiratory (~20 pathogens) panel by PCR     Status: None   Collection Time: 11/11/21  7:42 AM   Specimen: Nasopharyngeal Swab; Respiratory  Result Value Ref Range Status   Adenovirus NOT DETECTED NOT DETECTED Final   Coronavirus 229E NOT DETECTED NOT DETECTED Final    Comment: (NOTE) The Coronavirus on the Respiratory Panel, DOES NOT test for the novel  Coronavirus (2019 nCoV)    Coronavirus HKU1 NOT DETECTED NOT DETECTED Final   Coronavirus NL63 NOT DETECTED NOT DETECTED Final   Coronavirus OC43 NOT DETECTED NOT DETECTED Final   Metapneumovirus NOT DETECTED NOT DETECTED Final   Rhinovirus / Enterovirus NOT DETECTED NOT DETECTED Final   Influenza A NOT DETECTED NOT DETECTED Final   Influenza B NOT DETECTED NOT DETECTED Final   Parainfluenza Virus 1 NOT DETECTED NOT DETECTED Final   Parainfluenza Virus 2 NOT DETECTED NOT DETECTED Final   Parainfluenza Virus 3 NOT DETECTED NOT DETECTED Final   Parainfluenza Virus 4 NOT DETECTED NOT DETECTED Final   Respiratory Syncytial Virus NOT DETECTED NOT DETECTED Final   Bordetella pertussis NOT DETECTED NOT DETECTED Final   Bordetella Parapertussis NOT DETECTED NOT DETECTED Final   Chlamydophila pneumoniae NOT DETECTED NOT DETECTED Final   Mycoplasma pneumoniae NOT DETECTED NOT DETECTED Final    Comment: Performed at  [facility], [place], [place],  [address]         Radiology Studies: CT Chest High Resolution  Result Date: 11/10/2021 CLINICAL DATA:  Cough, chronic/persisting > 8 weeks, history of myeloproliferative disorder EXAM: CT CHEST WITHOUT CONTRAST TECHNIQUE: Multidetector CT imaging of the chest was performed following the standard protocol without intravenous contrast. High resolution imaging of the lungs, as well as inspiratory and expiratory imaging, was performed. RADIATION DOSE REDUCTION: This exam was performed according to the departmental dose-optimization program which includes automated exposure control, adjustment of the mA and/or kV according to patient size and/or use of iterative reconstruction technique. COMPARISON:  11/10/2021, 09/01/2021 FINDINGS: Cardiovascular: Unenhanced imaging of the heart demonstrates borderline cardiomegaly without pericardial effusion. Normal caliber of the thoracic aorta. There is atherosclerosis of the aorta and coronary vasculature unchanged. Right chest wall port via internal jugular approach tip within the atriocaval junction. Evaluation of the vascular lumen is limited  without IV contrast. Mediastinum/Nodes: No enlarged mediastinal, hilar, or axillary lymph nodes. Thyroid gland, trachea, and esophagus demonstrate no significant findings. Lungs/Pleura: Stable upper lobe predominant emphysema. Subpleural linear densities within the dependent lower lobes likely hypoventilatory in nature. No evidence of fibrosis. There is bilateral cylindrical bronchiectasis, greatest in the lower lobes. No bronchial wall thickening. The central airways are patent. No air trapping on expiration. Decreased caliber of the tracheobronchial tree during expiration may reflect an element of tracheobronchomalacia. Upper Abdomen: Stable splenomegaly. Otherwise no acute upper abdominal process. Musculoskeletal: There are no acute or destructive bony lesions. Reconstructed  images demonstrate no additional findings. IMPRESSION: 1. Bilateral cylindrical bronchiectasis. No evidence of bronchial wall thickening. 2. Upper lobe predominant emphysema. 3. Subpleural hypoventilatory changes at the lung bases. No evidence of underlying fibrosis. 4. Decreased caliber of the tracheobronchial tree during expiration, which could reflect an element of tracheal bronchomalacia. 5. Aortic Atherosclerosis (ICD10-I70.0) and Emphysema (ICD10-J43.9). 6. Splenomegaly, compatible with known history of myeloproliferative disorder. Electronically Signed   By: Randa Ngo M.D.   On: 11/10/2021 21:55   DG Chest 2 View  Result Date: 11/10/2021 CLINICAL DATA:  Shortness of breath and chest pains for 3 months. EXAM: CHEST - 2 VIEW COMPARISON:  Chest two views 10/19/2021, AP chest 09/01/2021 FINDINGS: Redemonstration of right chest wall porta catheter with tip overlying the med severe vena cava. Cardiac silhouette is at the upper limits of normal size. Mediastinal contours are within normal limits. Mild-to-moderate calcification within the aortic arch. The lungs are clear. No pleural effusion or pneumothorax. Mild multilevel degenerative disc changes of the thoracic spine. IMPRESSION: No active cardiopulmonary disease. Electronically Signed   By: Yvonne Kendall M.D.   On: 11/10/2021 14:58        Scheduled Meds:  sodium chloride   Intravenous Once   allopurinol  300 mg Oral Daily   Chlorhexidine Gluconate Cloth  6 each Topical Daily   citalopram  20 mg Oral Daily   dextromethorphan-guaiFENesin  1 tablet Oral BID   ipratropium-albuterol  3 mL Nebulization Q6H   metoprolol tartrate  25 mg Oral BID   mirtazapine  15 mg Oral QHS   pantoprazole  40 mg Oral BID   sodium chloride  3 mL Nebulization TID   sodium chloride flush  3 mL Intravenous Q12H   sulfamethoxazole-trimethoprim  1 tablet Oral Q12H   Continuous Infusions:  meropenem (MERREM) IV 1 g (11/12/21 1031)     LOS: 1 day   Time  spent= 35 mins    Jaycee Pelzer Arsenio Loader, MD [facility]  If 7PM-7AM, please contact night-coverage  11/12/2021, 11:28 AM

## 2021-11-12 NOTE — Progress Notes (Signed)
Mobility Specialist Progress Note:   11/12/21 1304  Mobility  Activity Ambulated with assistance in hallway  Activity Response Tolerated well  Distance Ambulated (ft) 100 ft  $Mobility charge 1 Mobility  Level of Assistance Standby assist, set-up cues, supervision of patient - no hands on  Assistive Device Other (Comment) (IV Pole)  Mobility Referral Yes   Pt received sitting EOB and agreeable. No complaints. Pt left sitting EOB with all needs met and call bell in reach.   Denika Krone Mobility Specialist-Acute Rehab Secure Chat only

## 2021-11-12 NOTE — Plan of Care (Signed)

## 2021-11-12 NOTE — Telephone Encounter (Signed)
Please schedule hospital follow-up visit with Dr. [name] in the next 2 to 6 weeks.

## 2021-11-12 NOTE — Progress Notes (Signed)
NAME:  Renee Waters, MRN:  [id number], DOB:  04-28-1945, LOS: 1 ADMISSION DATE:  11/10/2021, CONSULTATION DATE: 11/11/2021 REFERRING MD: [facility], CHIEF COMPLAINT: Purulent sputum  History of Present Illness:  76 year old female who quit smoking in 2010 and has had 3 months of increasing shortness of breath chronic cough with gray sputum he has been seen by Dr. Christinia Gully of pulmonary and treated for postnasal drip with antimicrobial therapy.  She does have a complicated past medical history with CT revealing bronchomalacia, bronchiectasis right Port-A-Cath in place but without overt signs of infection.She has been treated with Augmentin with the onset of liquid diarrhea. She presents to [facility] with increasing shortness of breath, productive cough of bright gray sputum although this is complicated by her immunosuppressed history from myeloproliferative neoplasm, lupus with chronic steroid use with a plethora of other health issues that are well documented below.  She is notable to be positive for Pseudomonas and stenotrophomonas and now is on meropenem and Bactrim.  We recommended pulmonary toilet, continuation of antimicrobial therapy and further follow-up with pulmonary in the future.  Pertinent  Medical History   Past Medical History:  Diagnosis Date   Acute cholangitis    Allergy    Anemia    Anxiety    Arthritis    [employer] 03/18/2020   Barrett's esophagus    Cataract    Chronic back pain    Chronic neck pain    CKD (chronic kidney disease) stage 3, GFR 30-59 ml/min ([place]) 10/29/2020   Depression    Genital herpes    GERD (gastroesophageal reflux disease)    H/O degenerative disc disease    History of blood transfusion    Hypertension    Insomnia    Lupus (systemic lupus erythematosus) ([place])    Neuromuscular disorder ([place])    Osteoarthritis    S/P colonoscopy June 2005   normal, no polyps   S/P endoscopy June 2005, Oct 2009   2005: short-segment Barrett's, 2009:  short-segment Barrett's   Upper respiratory tract infection 07/09/2020   UTI (lower urinary tract infection) 11/2012     Significant Hospital Events: Including procedures, antibiotic start and stop dates in addition to other pertinent events     Interim History / Subjective:  Currently on room air in no acute distress  Objective   Blood pressure (!) 146/74, pulse 81, temperature 98.8 F (37.1 C), temperature source Oral, resp. rate 18, height '5\' 5"'$  (1.651 m), weight 57.4 kg, SpO2 91 %.        Intake/Output Summary (Last 24 hours) at 11/12/2021 1204 Last data filed at 11/12/2021 0900 Gross per 24 hour  Intake 90 ml  Output 800 ml  Net -710 ml   Filed Weights   11/11/21 2042  Weight: 57.4 kg    Examination: General: Awake alert in good spirits HEENT: MM pink/moist no JVD or lymphadenopathy is appreciated Neuro: Grossly intact much better spirits today CV: Heart sounds are regular PULM: Mild rhonchi GI: soft, bsx4 active  GU: Voids Extremities: warm/dry, negative edema  Skin: no rashes or lesions  Resolved Hospital Problem list     Assessment & Plan:  Chronic cough with sputum positive for Pseudomonas and stenotrophomonas complicated by underlying bronchomalacia, bronchiectasis and emphysema and is immunocompromised.  Along with lupus. Continue IV meropenem at home for total of 15 days Continue Bactrim p.o. for total 15 days  Pulmonary toilet was flutter valve and incentive spirometer which she is using Home albuterol nebulizer as needed if  this is helping her remove sputum. Follow-up with Dr. [name] as an outpatient in 2 weeks Okay from pulmonary standpoint for her to be discharged in the next 48 hours  PCCM will sign off at this time.    Recent onset of diarrhea while on Augmentin C. difficile was negative  History of myeloproliferative neoplasm Continue to monitor  Gastroesophageal reflux PPI  Chronic anxiety depression Anxiolytics Antidepressants Best  Practice (right click and "Reselect all SmartList Selections" daily)   Diet/type: Regular consistency (see orders) DVT prophylaxis:  GI prophylaxis: PPI Lines: Central line Foley:  Yes, and it is still needed Code Status:  full code Last date of multidisciplinary goals of care discussion [TBD]  Labs   CBC: Recent Labs  Lab 11/10/21 1830 11/11/21 0410 11/12/21 0502  WBC 123.9* 132.6* 116.1*  NEUTROABS 78.1*  --   --   HGB 8.2* 8.1* 7.5*  HCT 24.9* 25.1* 23.3*  MCV 92.2 91.9 92.5  PLT 77* 85* 70*    Basic Metabolic Panel: Recent Labs  Lab 11/10/21 1830 11/11/21 0410 11/12/21 0502  NA 141 143 139  K 3.9 3.9 4.1  CL 109 111 108  CO2 '23 24 22  '$ GLUCOSE 118* 84 88  BUN '23 18 14  '$ CREATININE 1.18* 1.24* 1.47*  CALCIUM 8.6* 8.5* 8.8*  MG  --   --  1.7   GFR: Estimated Creatinine Clearance: 29.8 mL/min (A) (by C-G formula based on SCr of 1.47 mg/dL (H)). Recent Labs  Lab 11/10/21 1830 11/10/21 2104 11/11/21 0410 11/11/21 2112 11/12/21 0502  PROCALCITON  --   --   --  0.17  --   WBC 123.9*  --  132.6*  --  116.1*  LATICACIDVEN 1.6 1.0  --   --   --     Liver Function Tests: Recent Labs  Lab 11/10/21 1830 11/11/21 0410  AST 37 39  ALT 25 22  ALKPHOS 160* 155*  BILITOT 0.6 0.7  PROT 7.3 7.4  ALBUMIN 3.7 3.8   No results for input(s): "LIPASE", "AMYLASE" in the last 168 hours. No results for input(s): "AMMONIA" in the last 168 hours.  ABG No results found for: "PHART", "PCO2ART", "PO2ART", "HCO3", "TCO2", "ACIDBASEDEF", "O2SAT"   Coagulation Profile: No results for input(s): "INR", "PROTIME" in the last 168 hours.  Cardiac Enzymes: No results for input(s): "CKTOTAL", "CKMB", "CKMBINDEX", "TROPONINI" in the last 168 hours.  HbA1C: Hgb A1c MFr Bld  Date/Time Value Ref Range Status  11/03/2014 10:03 AM 6.0 (H) <5.7 % Final    Comment:                                                                           According to the ADA Clinical Practice  Recommendations for 2011, when HbA1c is used as a screening test:     >=6.5%   Diagnostic of Diabetes Mellitus            (if abnormal result is confirmed)   5.7-6.4%   Increased risk of developing Diabetes Mellitus   References:Diagnosis and Classification of Diabetes Mellitus,Diabetes UDJS,9702,63(ZCHYI 1):S62-S69 and Standards of Medical Care in         Diabetes - 2011,Diabetes FOYD,7412,87 (Suppl 1):S11-S61.     07/16/2013  10:15 AM 5.7 (H) <5.7 % Final    Comment:                                                                           According to the ADA Clinical Practice Recommendations for 2011, when HbA1c is used as a screening test:     >=6.5%   Diagnostic of Diabetes Mellitus            (if abnormal result is confirmed)   5.7-6.4%   Increased risk of developing Diabetes Mellitus   References:Diagnosis and Classification of Diabetes Mellitus,Diabetes JOIT,2549,82(MEBRA 1):S62-S69 and Standards of Medical Care in         Diabetes - 2011,Diabetes XENM,0768,08 (Suppl 1):S11-S61.      CBG: No results for input(s): "GLUCAP" in the last 168 hours.    Critical care time: Ferol Luz Amya Hlad ACNP Acute Care Nurse Practitioner [place] Please consult Amion 11/12/2021, 12:04 PM

## 2021-11-12 NOTE — Progress Notes (Signed)
PHARMACY CONSULT NOTE FOR:  OUTPATIENT  PARENTERAL ANTIBIOTIC THERAPY (OPAT)  Indication:  Pseudomonas pneumonia Regimen: meropenem 1g IV Q12h  End date: 11/24/21  IV antibiotic discharge orders are pended. To discharging provider:  please sign these orders via discharge navigator,  Select New Orders & click on the button choice - Manage This Unsigned Work.     Thank you for allowing pharmacy to be a part of this patient's care.  Ardyth Harps, PharmD Clinical Pharmacist

## 2021-11-13 DIAGNOSIS — J151 Pneumonia due to Pseudomonas: Secondary | ICD-10-CM | POA: Diagnosis not present

## 2021-11-13 LAB — CBC
HCT: 26.7 % — ABNORMAL LOW (ref 36.0–46.0)
Hemoglobin: 8.5 g/dL — ABNORMAL LOW (ref 12.0–15.0)
MCH: 28.4 pg (ref 26.0–34.0)
MCHC: 31.8 g/dL (ref 30.0–36.0)
MCV: 89.3 fL (ref 80.0–100.0)
Platelets: 57 10*3/uL — ABNORMAL LOW (ref 150–400)
RBC: 2.99 MIL/uL — ABNORMAL LOW (ref 3.87–5.11)
RDW: 22.5 % — ABNORMAL HIGH (ref 11.5–15.5)
WBC: 105.6 10*3/uL (ref 4.0–10.5)
nRBC: 2.6 % — ABNORMAL HIGH (ref 0.0–0.2)

## 2021-11-13 LAB — BASIC METABOLIC PANEL
Anion gap: 8 (ref 5–15)
Anion gap: 6 (ref 5–15)
BUN: 12 mg/dL (ref 8–23)
BUN: 12 mg/dL (ref 8–23)
CO2: 21 mmol/L — ABNORMAL LOW (ref 22–32)
CO2: 21 mmol/L — ABNORMAL LOW (ref 22–32)
Calcium: 8.5 mg/dL — ABNORMAL LOW (ref 8.9–10.3)
Calcium: 8.1 mg/dL — ABNORMAL LOW (ref 8.9–10.3)
Chloride: 111 mmol/L (ref 98–111)
Chloride: 112 mmol/L — ABNORMAL HIGH (ref 98–111)
Creatinine, Ser: 1.57 mg/dL — ABNORMAL HIGH (ref 0.44–1.00)
Creatinine, Ser: 1.42 mg/dL — ABNORMAL HIGH (ref 0.44–1.00)
GFR, Estimated: 34 mL/min — ABNORMAL LOW (ref >60)
GFR, Estimated: 39 mL/min — ABNORMAL LOW (ref >60)
Glucose, Bld: 95 mg/dL (ref 70–99)
Glucose, Bld: 99 mg/dL (ref 70–99)
Potassium: 4.1 mmol/L (ref 3.5–5.1)
Potassium: 3.9 mmol/L (ref 3.5–5.1)
Sodium: 140 mmol/L (ref 135–145)
Sodium: 139 mmol/L (ref 135–145)

## 2021-11-13 LAB — MAGNESIUM: Magnesium: 1.8 mg/dL (ref 1.7–2.4)

## 2021-11-13 MED ORDER — SULFAMETHOXAZOLE-TRIMETHOPRIM 400-80 MG PO TABS
1.0000 | ORAL_TABLET | Freq: Two times a day (BID) | ORAL | Status: DC
  Filled 2021-11-13: qty 1

## 2021-11-13 MED ORDER — SULFAMETHOXAZOLE-TRIMETHOPRIM 800-160 MG PO TABS
1.0000 | ORAL_TABLET | Freq: Two times a day (BID) | ORAL | Status: DC
  Filled 2021-11-13: qty 1

## 2021-11-13 MED ORDER — SODIUM CHLORIDE 0.9 % IV BOLUS
500.0000 mL | Freq: Once | INTRAVENOUS | Status: AC
  Administered 2021-11-13: 500 mL via INTRAVENOUS

## 2021-11-13 NOTE — Progress Notes (Signed)
Mobility Specialist Progress Note:   11/13/21 1047  Mobility  Activity Ambulated with assistance in hallway  Activity Response Tolerated well  Distance Ambulated (ft) 120 ft  $Mobility charge 1 Mobility  Level of Assistance Standby assist, set-up cues, supervision of patient - no hands on  Assistive Device Other (Comment) (IV Pole)  Mobility Referral Yes   Pt received in bed and agreeable. Took 1x standing break d/t fatigue and 10/10 right hip pain. Pt left in bed with all needs met and call bell in reach.   Beauregard Jarrells Mobility Specialist-Acute Rehab Secure Chat only

## 2021-11-13 NOTE — Discharge Summary (Signed)
Physician Discharge Summary  Renee Waters ZOX:[id number] DOB: 10-18-1945 DOA: 11/10/2021  PCP: Renee Helper, MD  Admit date: 11/10/2021 Discharge date: 11/13/2021  Admitted From: HOME Disposition:  HOME  Recommendations for Outpatient Follow-up:  Follow up with PCP in 1-2 weeks Please obtain BMP/CBC in one week your next doctors visit.  Home with IV Meropenem and Bactrim PO BID to complete total 14 day course. IV Abx to be infused via her chemo port. Renee [facility] arranged to help manage her port at home. Family educated for medication infusion.  Hold Jafaki during active infection. Follow up outpatient Onc in the next 7-10 days for further instructions.  Follow up outpatient Pulm per their service.   Home Health: [facility] Equipment/Devices: IV Abx at home Discharge Condition: Stable CODE STATUS: Full Diet recommendation: 2g Na.   Brief/Interim Summary: 76 year old with history of lupus, HTN, GERD, chronic pain, insomnia, depression, anxiety, CKD stage IIIb, myeloproliferative neoplasm admitted for chronic cough and shortness of breath.  Cough has been ongoing for 4 weeks treated with Augmentin but developed diarrhea.  Cultures from 9/19 grew Pseudomonas and stenotrophomonas.  CT chest showed bilateral cylindrical bronchiectasis, emphysema, bronchomalacia.  Started on IV meropenem and p.o. Bactrim.  Pulmonary team was consulted.  COVID and flu were negative.  Hemoglobin slowly drifted down therefore 1 unit PRBC transfusion.  Hemoglobin appropriately responded, renal function slowly trending up.  Patient was seen by Pulm, over the course of 3 days her symptoms improved. It was recommended she stays on IV Meropenem and PO Bactrim for total of 2 weeks, therefore arrangements were made. Due to low Hb, she did get PRBC transfusion in the hospital as she has in the past outpatient. She developed mild AKI during hospitalization, but improved with fluids.  She is stable for dc with recs as above.     Assessment & Plan:  Principal Problem:   Pneumonia Active Problems:   Depression   Labile hypertension   GERD (gastroesophageal reflux disease)   Chronic midline low back pain with sciatica   Insomnia secondary to depression with anxiety   Systemic lupus (HCC)   Anemia, normocytic normochromic   CKD (chronic kidney disease) stage 3, GFR 30-59 ml/min (HCC)   Anxiety   MPN (myeloproliferative neoplasm) (HCC)   Bronchiectasis assoc with possible tracheomalacia   Acute respiratory distress secondary to bronchopneumonia, immunocompromised -Patient has failed outpatient treatment with Augmentin.  Cultures from 9/19 grew Pseudomonas, stenotrophomonas.  ProCal 0.17, BNP 168 - On IV meropenem and p.o. Bactrim to complete total 14 days. Has Chemo port which can be used. Alsen Waters to help manage it at home and change needle when approrpaite. Follow up outpatient Pulm - Bronchodilators, I-S/flutter valve - Flu/COVID-negative.  Respiratory panel negative   Diarrhea -Due to recent antibiotic use.  But states this is resolved now   Labile hypertension -Currently on metoprolol 25 mg twice daily.  -   AKI CKD 3B secondary to lupus; improved.  -Baseline creatinine 1.2.  Not on any lupus medications?  peaked at 1.5, no improved with lfluids. Advised oral hydration at home.      Anemia Thrombocytopenia Myeloproliferative neoplasm -one biopsy consistent with granulocytic proliferation: CMML versus myeloproliferative disorder; with further evaluation more suggestive of myeloproliferative neoplasm.  Known splenomegaly.  Jakafi on hold due to active infection.  Follows with outpatient Renee Waters -Hemoglobin drifted down, s/p 1 unit PRBC.  Gets periodic transfusion outpatient. Hb stable. Follow up outpatient. Renee Waters can be resumed when ok with Onc outpatient - Continue  allopurinol, oxycodone   GERD -Protonix twice daily   Insomnia, depression, anxiety -Celexa, Remeron       Chronic  pain -Continue home oxycodone.      Discharge Diagnoses:  Principal Problem:   Pneumonia Active Problems:   Depression   Labile hypertension   GERD (gastroesophageal reflux disease)   Chronic midline low back pain with sciatica   Insomnia secondary to depression with anxiety   Systemic lupus (HCC)   Anemia, normocytic normochromic   CKD (chronic kidney disease) stage 3, GFR 30-59 ml/min (HCC)   Anxiety   MPN (myeloproliferative neoplasm) (HCC)   Bronchiectasis assoc with possible tracheomalacia      Consultations: Pulm  Subjective: No complaints, doing ok.   Discharge Exam: Vitals:   11/13/21 0039 11/13/21 0722  BP: (!) 131/58 (!) 146/72  Pulse: 80 86  Resp: 16 18  Temp: 97.7 F (36.5 C) 98.5 F (36.9 C)  SpO2: 98% 95%   Vitals:   11/12/21 2228 11/12/21 2249 11/13/21 0039 11/13/21 0722  BP: (!) 142/71 (!) 149/69 (!) 131/58 (!) 146/72  Pulse: 78 79 80 86  Resp: 18 18 16 18   Temp: 98.5 F (36.9 C) 98.6 F (37 C) 97.7 F (36.5 C) 98.5 F (36.9 C)  TempSrc: Oral Oral Axillary Oral  SpO2: 100% 95% 98% 95%  Weight:      Height:        General: Pt is alert, awake, not in acute distress Cardiovascular: RRR, S1/S2 +, no rubs, no gallops Respiratory: CTA bilaterally, no wheezing, no rhonchi Abdominal: Soft, NT, ND, bowel sounds + Extremities: no edema, no cyanosis Chemo port in place.  Discharge Instructions  Discharge Instructions     [facility] pharmacist to adjust dose for Vancomycin, Aminoglycosides and other anti-infective therapies as requested by physician.   Complete by: As directed    [facility] to provide Cath Flo 85m   Complete by: As directed    Administer for PICC line occlusion and as ordered by physician for other access device issues.   Anaphylaxis Kit: Provided to treat any anaphylactic reaction to the medication being provided to the patient if First Dose or when requested by physician   Complete by: As  directed    Epinephrine 136mml vial / amp: Administer 0.40m42m0.40ml36mubcutaneously once for moderate to severe anaphylaxis, nurse to call physician and pharmacy when reaction occurs and call 911 if needed for immediate care   Diphenhydramine 50mg14mIV vial: Administer 25-50mg 67mM PRN for first dose reaction, rash, itching, mild reaction, nurse to call physician and pharmacy when reaction occurs   Sodium Chloride 0.9% NS 500ml I92mdminister if needed for hypovolemic blood pressure drop or as ordered by physician after call to physician with anaphylactic reaction   Change dressing on IV access line weekly and PRN   Complete by: As directed    Flush IV access with Sodium Chloride 0.9% and Heparin 10 units/ml or 100 units/ml   Complete by: As directed    Home infusion instructions - [facility]   Complete by: As directed    Instructions: Flush IV access with Sodium Chloride 0.9% and Heparin 10units/ml or 100units/ml   Change dressing on IV access line: Weekly and PRN   Instructions Cath Flo 2mg: Ad72mister for PICC Line occlusion and as ordered by physician for other access device   [facility] pharmacist to adjust dose for: Vancomycin, Aminoglycosides and other anti-infective therapies as requested by physician   Method  of administration may be changed at the discretion of home infusion pharmacist based upon assessment of the patient and/or caregiver's ability to self-administer the medication ordered   Complete by: As directed       Allergies as of 11/13/2021       Reactions   Bee Venom Swelling, Hives   Norvasc [amlodipine] Other (See Comments)   Hair loss   Tylenol [acetaminophen] Itching   Red Dye Itching   Percocet [oxycodone-acetaminophen] Rash, Other (See Comments)   Pt states, "this gives her a rash, but at home she takes oxycodone for pain relief"   Tyloxapol Itching, Rash        Medication List     STOP taking these medications     amoxicillin-clavulanate 875-125 MG tablet Commonly known as: AUGMENTIN       TAKE these medications    acyclovir 800 MG tablet Commonly known as: ZOVIRAX Take 1 tablet (800 mg total) by mouth 5 (five) times daily.   acyclovir ointment 5 % Commonly known as: ZOVIRAX Apply 1 application. topically every 3 (three) hours.   albuterol 108 (90 Base) MCG/ACT inhaler Commonly known as: VENTOLIN HFA Inhale 2 puffs into the lungs every 6 (six) hours as needed for wheezing or shortness of breath.   allopurinol 300 MG tablet Commonly known as: ZYLOPRIM Take 1 tablet (300 mg total) by mouth daily.   budesonide-formoterol 80-4.5 MCG/ACT inhaler Commonly known as: Symbicort Take 2 puffs first thing in am and then another 2 puffs about 12 hours later.   citalopram 20 MG tablet Commonly known as: CELEXA Take 1 tablet by mouth once daily   EPINEPHrine 0.3 mg/0.3 mL Soaj injection Commonly known as: EPI-PEN INJECT 0.3 MLS INTO MUSCLE ONCE AS NEEDED What changed: See the new instructions.   furosemide 20 MG tablet Commonly known as: LASIX Take 1 tablet (20 mg total) by mouth daily as needed.   gabapentin 300 MG capsule Commonly known as: NEURONTIN Take one capsule by mouth once daily, as needed, for pain What changed:  how much to take how to take this when to take this reasons to take this additional instructions   lidocaine-prilocaine cream Commonly known as: EMLA Apply 1 application. topically as needed. What changed: reasons to take this   meropenem  IVPB Commonly known as: MERREM Inject 1 g into the vein every 12 (twelve) hours for 11 days. Indication:  Pseudomonas pneumonia First Dose: Yes Last Day of Therapy:  11/24/2021 Labs - Once weekly:  CBC/D and BMP, Labs - Every other week:  ESR and CRP Method of administration: Mini-Bag Plus / Gravity Method of administration may be changed at the discretion of home infusion pharmacist based upon assessment of the  patient and/or caregiver's ability to self-administer the medication ordered.   metoprolol tartrate 25 MG tablet Commonly known as: LOPRESSOR Take 1 tablet (25 mg total) by mouth 2 (two) times daily.   mirtazapine 15 MG tablet Commonly known as: REMERON Take 1 tablet (15 mg total) by mouth at bedtime.   multivitamin with minerals Tabs tablet Take 1 tablet by mouth every other day.   oxyCODONE 5 MG immediate release tablet Commonly known as: Oxy IR/ROXICODONE Take 5 mg by mouth every 4 (four) hours as needed for moderate pain, severe pain or breakthrough pain.   pantoprazole 40 MG tablet Commonly known as: Protonix Take 1 tablet (40 mg total) by mouth 2 (two) times daily before a meal.   potassium chloride 10 MEQ tablet Commonly known as:  KLOR-CON Take 1 tablet by mouth once daily   ruxolitinib phosphate 15 MG tablet Commonly known as: Jakafi Take 1 tablet (15 mg total) by mouth 2 (two) times daily.   sulfamethoxazole-trimethoprim 800-160 MG tablet Commonly known as: BACTRIM DS Take 1 tablet by mouth every 12 (twelve) hours for 11 days.   temazepam 30 MG capsule Commonly known as: Restoril Take 1 capsule (30 mg total) by mouth at bedtime as needed for sleep.   tiZANidine 4 MG tablet Commonly known as: ZANAFLEX Take 2 tablets (8 mg total) by mouth 3 (three) times daily as needed (spasms). What changed:  how much to take reasons to take this               Durable Medical Equipment  (From admission, onward)           Start     Ordered   11/12/21 1123  For home use only DME Nebulizer machine  Once       Question Answer Comment  Patient needs a nebulizer to treat with the following condition Pneumonia   Length of Need Lifetime      11/12/21 1122              Discharge Care Instructions  (From admission, onward)           Start     Ordered   11/12/21 0000  Change dressing on IV access line weekly and PRN  (Home infusion instructions -  [facility] )        11/12/21 1348            Follow-up Information     Renee Helper, MD. Schedule an appointment as soon as possible for a visit.   Specialty: Family Medicine Contact information: [address] [place] [address] [phone]         [facility] Follow up.   Why: [facility] will be providing your IV antibiotics and supplies to the home.        [facility] Follow up.   Why: [facility] will call you and set up home health visits for teaching and care in regards to your IV antibiotics, port-a-cath care. Contact information: [address] [place] [address] [phone]         [facility] Follow up.   Why: [facility] delivered a nebulizer to the hospital room for you to take home and use. Contact information: [place] [address] [phone]         Renee Helper, MD Follow up in 1 week(s).   Specialty: Family Medicine Contact information: [address], [place] [address] [phone]         Janina Mayo, MD .   Specialty: Cardiology Contact information: [address] [place] [address] [phone]                Allergies  Allergen Reactions   Bee Venom Swelling and Hives   Norvasc [Amlodipine] Other (See Comments)    Hair loss   Tylenol [Acetaminophen] Itching   Red Dye Itching   Percocet [Oxycodone-Acetaminophen] Rash and Other (See Comments)    Pt states, "this gives her a rash, but at home she takes oxycodone for pain relief"    Tyloxapol Itching and Rash    You were cared for by a hospitalist during your hospital stay. If you have any questions  about your discharge medications or the care you received while you were in the hospital after you are discharged, you can call the unit and asked to speak with the hospitalist on call if the hospitalist that took care of you is not  available. Once you are discharged, your primary care physician will handle any further medical issues. Please note that no refills for any discharge medications will be authorized once you are discharged, as it is imperative that you return to your primary care physician (or establish a relationship with a primary care physician if you do not have one) for your aftercare needs so that they can reassess your need for medications and monitor your lab values.   Procedures/Studies: CT Chest High Resolution  Result Date: 11/10/2021 CLINICAL DATA:  Cough, chronic/persisting > 8 weeks, history of myeloproliferative disorder EXAM: CT CHEST WITHOUT CONTRAST TECHNIQUE: Multidetector CT imaging of the chest was performed following the standard protocol without intravenous contrast. High resolution imaging of the lungs, as well as inspiratory and expiratory imaging, was performed. RADIATION DOSE REDUCTION: This exam was performed according to the departmental dose-optimization program which includes automated exposure control, adjustment of the mA and/or kV according to patient size and/or use of iterative reconstruction technique. COMPARISON:  11/10/2021, 09/01/2021 FINDINGS: Cardiovascular: Unenhanced imaging of the heart demonstrates borderline cardiomegaly without pericardial effusion. Normal caliber of the thoracic aorta. There is atherosclerosis of the aorta and coronary vasculature unchanged. Right chest wall port via internal jugular approach tip within the atriocaval junction. Evaluation of the vascular lumen is limited without IV contrast. Mediastinum/Nodes: No enlarged mediastinal, hilar, or axillary lymph nodes. Thyroid gland, trachea, and esophagus demonstrate no significant findings. Lungs/Pleura: Stable upper lobe predominant emphysema. Subpleural linear densities within the dependent lower lobes likely hypoventilatory in nature. No evidence of fibrosis. There is bilateral cylindrical bronchiectasis,  greatest in the lower lobes. No bronchial wall thickening. The central airways are patent. No air trapping on expiration. Decreased caliber of the tracheobronchial tree during expiration may reflect an element of tracheobronchomalacia. Upper Abdomen: Stable splenomegaly. Otherwise no acute upper abdominal process. Musculoskeletal: There are no acute or destructive bony lesions. Reconstructed images demonstrate no additional findings. IMPRESSION: 1. Bilateral cylindrical bronchiectasis. No evidence of bronchial wall thickening. 2. Upper lobe predominant emphysema. 3. Subpleural hypoventilatory changes at the lung bases. No evidence of underlying fibrosis. 4. Decreased caliber of the tracheobronchial tree during expiration, which could reflect an element of tracheal bronchomalacia. 5. Aortic Atherosclerosis (ICD10-I70.0) and Emphysema (ICD10-J43.9). 6. Splenomegaly, compatible with known history of myeloproliferative disorder. Electronically Signed   By: Randa Ngo M.D.   On: 11/10/2021 21:55   DG Chest 2 View  Result Date: 11/10/2021 CLINICAL DATA:  Shortness of breath and chest pains for 3 months. EXAM: CHEST - 2 VIEW COMPARISON:  Chest two views 10/19/2021, AP chest 09/01/2021 FINDINGS: Redemonstration of right chest wall porta catheter with tip overlying the med severe vena cava. Cardiac silhouette is at the upper limits of normal size. Mediastinal contours are within normal limits. Mild-to-moderate calcification within the aortic arch. The lungs are clear. No pleural effusion or pneumothorax. Mild multilevel degenerative disc changes of the thoracic spine. IMPRESSION: No active cardiopulmonary disease. Electronically Signed   By: Yvonne Kendall M.D.   On: 11/10/2021 14:58   ECHOCARDIOGRAM COMPLETE  Result Date: 10/20/2021    ECHOCARDIOGRAM REPORT   Patient Name:   Renee Waters Date of Exam: 10/20/2021 Medical Rec #:  [id number]      Height:  65.0 in Accession #:    [id number]     Weight:        131.0 lb Date of Birth:  1945-04-01     BSA:          1.652 m Patient Age:    18 years       BP:           133/71 mmHg Patient Gender: F              HR:           67 bpm. Exam Location:  [name] Procedure: 2D Echo, Cardiac Doppler and Color Doppler Indications:    R01.1 Murmur  History:        Patient has no prior history of Echocardiogram examinations.                 Risk Factors:Hypertension. Lupus (systemic lupus erythematosus)                 (HCC) (From Hx).  Sonographer:    Alvino Chapel RCS Referring Phys: [place]  1. Left ventricular ejection fraction, by estimation, is 55 to 60%. The left ventricle has normal function. The left ventricle has no regional wall motion abnormalities. Left ventricular diastolic parameters are consistent with Grade II diastolic dysfunction (pseudonormalization).  2. Right ventricular systolic function is normal. The right ventricular size is normal. Tricuspid regurgitation signal is inadequate for assessing PA pressure.  3. Left atrial size was moderately dilated.  4. Right atrial size was upper normal.  5. The mitral valve is grossly normal. Mild mitral valve regurgitation.  6. The aortic valve is tricuspid. Aortic valve regurgitation is mild to moderate. Aortic valve sclerosis/calcification is present, without any evidence of aortic stenosis. Aortic regurgitation PHT measures 447 msec. Aortic valve mean gradient measures 12.0 mmHg.  7. The inferior vena cava is normal in size with greater than 50% respiratory variability, suggesting right atrial pressure of 3 mmHg. Comparison(s): No prior Echocardiogram. FINDINGS  Left Ventricle: Left ventricular ejection fraction, by estimation, is 55 to 60%. The left ventricle has normal function. The left ventricle has no regional wall motion abnormalities. The left ventricular internal cavity size was normal in size. There is  borderline left ventricular hypertrophy. Left ventricular diastolic parameters  are consistent with Grade II diastolic dysfunction (pseudonormalization). Right Ventricle: The right ventricular size is normal. No increase in right ventricular wall thickness. Right ventricular systolic function is normal. Tricuspid regurgitation signal is inadequate for assessing PA pressure. Left Atrium: Left atrial size was moderately dilated. Right Atrium: Right atrial size was upper normal. Pericardium: There is no evidence of pericardial effusion. Mitral Valve: The mitral valve is grossly normal. Mild mitral valve regurgitation. Tricuspid Valve: The tricuspid valve is grossly normal. Tricuspid valve regurgitation is trivial. Aortic Valve: The aortic valve is tricuspid. There is mild aortic valve annular calcification. Aortic valve regurgitation is mild to moderate. Aortic regurgitation PHT measures 447 msec. Aortic valve sclerosis/calcification is present, without any evidence of aortic stenosis. Aortic valve mean gradient measures 12.0 mmHg. Aortic valve peak gradient measures 25.6 mmHg. Aortic valve area, by VTI measures 1.63 cm. Pulmonic Valve: The pulmonic valve was grossly normal. Pulmonic valve regurgitation is trivial. Aorta: The aortic root is normal in size and structure. Venous: The inferior vena cava is normal in size with greater than 50% respiratory variability, suggesting right atrial pressure of 3 mmHg. IAS/Shunts: No atrial level shunt detected by color flow Doppler.  LEFT  VENTRICLE PLAX 2D LVIDd:         4.40 cm   Diastology LVIDs:         3.00 cm   LV e' medial:    6.64 cm/s LV PW:         1.00 cm   LV E/e' medial:  17.5 LV IVS:        1.00 cm   LV e' lateral:   6.74 cm/s LVOT diam:     1.80 cm   LV E/e' lateral: 17.2 LV SV:         82 LV SV Index:   50 LVOT Area:     2.54 cm  RIGHT VENTRICLE RV S prime:     17.40 cm/s TAPSE (M-mode): 3.0 cm LEFT ATRIUM             Index        RIGHT ATRIUM           Index LA diam:        3.00 cm 1.82 cm/m   RA Area:     17.50 cm LA Vol (A2C):   79.9 ml  48.36 ml/m  RA Volume:   49.50 ml  29.96 ml/m LA Vol (A4C):   83.3 ml 50.41 ml/m LA Biplane Vol: 82.5 ml 49.93 ml/m  AORTIC VALVE AV Area (Vmax):    1.61 cm AV Area (Vmean):   1.69 cm AV Area (VTI):     1.63 cm AV Vmax:           253.00 cm/s AV Vmean:          152.000 cm/s AV VTI:            0.505 m AV Peak Grad:      25.6 mmHg AV Mean Grad:      12.0 mmHg LVOT Vmax:         160.50 cm/s LVOT Vmean:        101.050 cm/s LVOT VTI:          0.323 m LVOT/AV VTI ratio: 0.64 AI PHT:            447 msec  AORTA Ao Root diam: 3.10 cm MITRAL VALVE MV Area (PHT): 2.29 cm     SHUNTS MV Decel Time: 331 msec     Systemic VTI:  0.32 m MV E velocity: 116.00 cm/s  Systemic Diam: 1.80 cm MV A velocity: 115.00 cm/s MV E/A ratio:  1.01 Rozann Lesches MD Electronically signed by Rozann Lesches MD Signature Date/Time: 10/20/2021/6:01:40 PM    Final    DG Chest 2 View  Result Date: 10/19/2021 CLINICAL DATA:  Cough, anemia, low blood pressure EXAM: CHEST - 2 VIEW COMPARISON:  09/02/2021 FINDINGS: RIGHT jugular Port-A-Cath with tip projecting over SVC. Enlargement of cardiac silhouette with pulmonary vascular congestion. Mediastinal contours normal. Minimal chronic interstitial prominence stable. No pulmonary infiltrate, pleural effusion, or pneumothorax. No osseous abnormalities. IMPRESSION: Enlargement of cardiac silhouette with pulmonary vascular congestion. Minimal chronic interstitial prominence without acute infiltrate. Electronically Signed   By: Lavonia Dana M.D.   On: 10/19/2021 16:41     The results of significant diagnostics from this hospitalization (including imaging, microbiology, ancillary and laboratory) are listed below for reference.     Microbiology: Recent Results (from the past 240 hour(s))  Blood culture (routine x 2)     Status: None (Preliminary result)   Collection Time: 11/10/21  6:30 PM   Specimen: BLOOD  Result Value Ref Range Status  Specimen Description BLOOD SITE NOT SPECIFIED  Final    Special Requests   Final    BOTTLES DRAWN AEROBIC AND ANAEROBIC Blood Culture adequate volume   Culture   Final    NO GROWTH 3 DAYS Performed at [facility], [address], [place], [place] [address]    Report Status PENDING  Incomplete  Resp Panel by RT-PCR (Flu A&B, Covid) Anterior Nasal Swab     Status: None   Collection Time: 11/10/21  9:11 PM   Specimen: Anterior Nasal Swab  Result Value Ref Range Status   SARS Coronavirus 2 by RT PCR NEGATIVE NEGATIVE Final    Comment: (NOTE) SARS-CoV-2 target nucleic acids are NOT DETECTED.  The SARS-CoV-2 RNA is generally detectable in upper respiratory specimens during the acute phase of infection. The lowest concentration of SARS-CoV-2 viral copies this assay can detect is 138 copies/mL. A negative result does not preclude SARS-Cov-2 infection and should not be used as the sole basis for treatment or other patient management decisions. A negative result may occur with  improper specimen collection/handling, submission of specimen other than nasopharyngeal swab, presence of viral mutation(s) within the areas targeted by this assay, and inadequate number of viral copies(<138 copies/mL). A negative result must be combined with clinical observations, patient history, and epidemiological information. The expected result is Negative.  Fact Sheet for Patients:  EntrepreneurPulse.com.au  Fact Sheet for Healthcare Providers:  IncredibleEmployment.be  This test is no t yet approved or cleared by the [place] FDA and  has been authorized for detection and/or diagnosis of SARS-CoV-2 by FDA under an Emergency Use Authorization (EUA). This EUA will remain  in effect (meaning this test can be used) for the duration of the COVID-19 declaration under Section 564(b)(1) of the Act, 21 U.S.C.section 360bbb-3(b)(1), unless the authorization is terminated  or revoked sooner.       Influenza A by PCR  NEGATIVE NEGATIVE Final   Influenza B by PCR NEGATIVE NEGATIVE Final    Comment: (NOTE) The Xpert Xpress SARS-CoV-2/FLU/RSV plus assay is intended as an aid in the diagnosis of influenza from Nasopharyngeal swab specimens and should not be used as a sole basis for treatment. Nasal washings and aspirates are unacceptable for Xpert Xpress SARS-CoV-2/FLU/RSV testing.  Fact Sheet for Patients: EntrepreneurPulse.com.au  Fact Sheet for Healthcare Providers: IncredibleEmployment.be  This test is not yet approved or cleared by the [place] FDA and has been authorized for detection and/or diagnosis of SARS-CoV-2 by FDA under an Emergency Use Authorization (EUA). This EUA will remain in effect (meaning this test can be used) for the duration of the COVID-19 declaration under Section 564(b)(1) of the Act, 21 U.S.C. section 360bbb-3(b)(1), unless the authorization is terminated or revoked.  Performed at [facility], [place] [address] [place], [place] [address]   Respiratory (~20 pathogens) panel by PCR     Status: None   Collection Time: 11/11/21  7:42 AM   Specimen: Nasopharyngeal Swab; Respiratory  Result Value Ref Range Status   Adenovirus NOT DETECTED NOT DETECTED Final   Coronavirus 229E NOT DETECTED NOT DETECTED Final    Comment: (NOTE) The Coronavirus on the Respiratory Panel, DOES NOT test for the novel  Coronavirus (2019 nCoV)    Coronavirus HKU1 NOT DETECTED NOT DETECTED Final   Coronavirus NL63 NOT DETECTED NOT DETECTED Final   Coronavirus OC43 NOT DETECTED NOT DETECTED Final   Metapneumovirus NOT DETECTED NOT DETECTED Final   Rhinovirus / Enterovirus NOT DETECTED NOT DETECTED  Final   Influenza A NOT DETECTED NOT DETECTED Final   Influenza B NOT DETECTED NOT DETECTED Final   Parainfluenza Virus 1 NOT DETECTED NOT DETECTED Final   Parainfluenza Virus 2 NOT DETECTED NOT DETECTED Final   Parainfluenza Virus 3 NOT DETECTED NOT  DETECTED Final   Parainfluenza Virus 4 NOT DETECTED NOT DETECTED Final   Respiratory Syncytial Virus NOT DETECTED NOT DETECTED Final   Bordetella pertussis NOT DETECTED NOT DETECTED Final   Bordetella Parapertussis NOT DETECTED NOT DETECTED Final   Chlamydophila pneumoniae NOT DETECTED NOT DETECTED Final   Mycoplasma pneumoniae NOT DETECTED NOT DETECTED Final    Comment: Performed at [facility], [place] [address] [place], [place] [address]     Labs: BNP (last 3 results) Recent Labs    09/02/21 1622 11/10/21 1830  BNP 569.0* 585.2*   Basic Metabolic Panel: Recent Labs  Lab 11/10/21 1830 11/11/21 0410 11/12/21 0502 11/13/21 0500 11/13/21 1259  NA 141 143 139 140 139  K 3.9 3.9 4.1 4.1 3.9  CL 109 111 108 111 112*  CO2 23 24 22  21* 21*  GLUCOSE 118* 84 88 95 99  BUN 23 18 14 12 12   CREATININE 1.18* 1.24* 1.47* 1.57* 1.42*  CALCIUM 8.6* 8.5* 8.8* 8.5* 8.1*  MG  --   --  1.7 1.8  --    Liver Function Tests: Recent Labs  Lab 11/10/21 1830 11/11/21 0410  AST 37 39  ALT 25 22  ALKPHOS 160* 155*  BILITOT 0.6 0.7  PROT 7.3 7.4  ALBUMIN 3.7 3.8   No results for input(s): "LIPASE", "AMYLASE" in the last 168 hours. No results for input(s): "AMMONIA" in the last 168 hours. CBC: Recent Labs  Lab 11/10/21 1830 11/11/21 0410 11/12/21 0502 11/13/21 0500  WBC 123.9* 132.6* 116.1* 105.6*  NEUTROABS 78.1*  --   --   --   HGB 8.2* 8.1* 7.5* 8.5*  HCT 24.9* 25.1* 23.3* 26.7*  MCV 92.2 91.9 92.5 89.3  PLT 77* 85* 70* 57*   Cardiac Enzymes: No results for input(s): "CKTOTAL", "CKMB", "CKMBINDEX", "TROPONINI" in the last 168 hours. BNP: Invalid input(s): "POCBNP" CBG: No results for input(s): "GLUCAP" in the last 168 hours. D-Dimer No results for input(s): "DDIMER" in the last 72 hours. Hgb A1c No results for input(s): "HGBA1C" in the last 72 hours. Lipid Profile No results for input(s): "CHOL", "HDL", "LDLCALC", "TRIG", "CHOLHDL", "LDLDIRECT" in the last 72  hours. Thyroid function studies No results for input(s): "TSH", "T4TOTAL", "T3FREE", "THYROIDAB" in the last 72 hours.  Invalid input(s): "FREET3" Anemia work up No results for input(s): "VITAMINB12", "FOLATE", "FERRITIN", "TIBC", "IRON", "RETICCTPCT" in the last 72 hours. Urinalysis    Component Value Date/Time   COLORURINE YELLOW 10/19/2021 1828   APPEARANCEUR HAZY (A) 10/19/2021 1828   LABSPEC 1.010 10/19/2021 1828   PHURINE 5.0 10/19/2021 1828   GLUCOSEU NEGATIVE 10/19/2021 1828   GLUCOSEU NEG mg/dL 05/26/2009 2225   HGBUR NEGATIVE 10/19/2021 1828   HGBUR negative 08/19/2008 1124   BILIRUBINUR NEGATIVE 10/19/2021 1828   BILIRUBINUR negative 10/08/2021 1134   BILIRUBINUR neg 08/15/2017 0955   KETONESUR NEGATIVE 10/19/2021 1828   PROTEINUR NEGATIVE 10/19/2021 1828   UROBILINOGEN 0.2 10/08/2021 1134   UROBILINOGEN 0.2 12/05/2012 1150   NITRITE NEGATIVE 10/19/2021 1828   LEUKOCYTESUR LARGE (A) 10/19/2021 1828   Sepsis Labs Recent Labs  Lab 11/10/21 1830 11/11/21 0410 11/12/21 0502 11/13/21 0500  WBC 123.9* 132.6* 116.1* 105.6*   Microbiology Recent Results (from the  past 240 hour(s))  Blood culture (routine x 2)     Status: None (Preliminary result)   Collection Time: 11/10/21  6:30 PM   Specimen: BLOOD  Result Value Ref Range Status   Specimen Description BLOOD SITE NOT SPECIFIED  Final   Special Requests   Final    BOTTLES DRAWN AEROBIC AND ANAEROBIC Blood Culture adequate volume   Culture   Final    NO GROWTH 3 DAYS Performed at [facility], [address], [place], [place] [address]    Report Status PENDING  Incomplete  Resp Panel by RT-PCR (Flu A&B, Covid) Anterior Nasal Swab     Status: None   Collection Time: 11/10/21  9:11 PM   Specimen: Anterior Nasal Swab  Result Value Ref Range Status   SARS Coronavirus 2 by RT PCR NEGATIVE NEGATIVE Final    Comment: (NOTE) SARS-CoV-2 target nucleic acids are NOT DETECTED.  The SARS-CoV-2 RNA is generally  detectable in upper respiratory specimens during the acute phase of infection. The lowest concentration of SARS-CoV-2 viral copies this assay can detect is 138 copies/mL. A negative result does not preclude SARS-Cov-2 infection and should not be used as the sole basis for treatment or other patient management decisions. A negative result may occur with  improper specimen collection/handling, submission of specimen other than nasopharyngeal swab, presence of viral mutation(s) within the areas targeted by this assay, and inadequate number of viral copies(<138 copies/mL). A negative result must be combined with clinical observations, patient history, and epidemiological information. The expected result is Negative.  Fact Sheet for Patients:  EntrepreneurPulse.com.au  Fact Sheet for Healthcare Providers:  IncredibleEmployment.be  This test is no t yet approved or cleared by the [place] FDA and  has been authorized for detection and/or diagnosis of SARS-CoV-2 by FDA under an Emergency Use Authorization (EUA). This EUA will remain  in effect (meaning this test can be used) for the duration of the COVID-19 declaration under Section 564(b)(1) of the Act, 21 U.S.C.section 360bbb-3(b)(1), unless the authorization is terminated  or revoked sooner.       Influenza A by PCR NEGATIVE NEGATIVE Final   Influenza B by PCR NEGATIVE NEGATIVE Final    Comment: (NOTE) The Xpert Xpress SARS-CoV-2/FLU/RSV plus assay is intended as an aid in the diagnosis of influenza from Nasopharyngeal swab specimens and should not be used as a sole basis for treatment. Nasal washings and aspirates are unacceptable for Xpert Xpress SARS-CoV-2/FLU/RSV testing.  Fact Sheet for Patients: EntrepreneurPulse.com.au  Fact Sheet for Healthcare Providers: IncredibleEmployment.be  This test is not yet approved or cleared by the [place] FDA  and has been authorized for detection and/or diagnosis of SARS-CoV-2 by FDA under an Emergency Use Authorization (EUA). This EUA will remain in effect (meaning this test can be used) for the duration of the COVID-19 declaration under Section 564(b)(1) of the Act, 21 U.S.C. section 360bbb-3(b)(1), unless the authorization is terminated or revoked.  Performed at [facility], [place] [address] [place], [place] [address]   Respiratory (~20 pathogens) panel by PCR     Status: None   Collection Time: 11/11/21  7:42 AM   Specimen: Nasopharyngeal Swab; Respiratory  Result Value Ref Range Status   Adenovirus NOT DETECTED NOT DETECTED Final   Coronavirus 229E NOT DETECTED NOT DETECTED Final    Comment: (NOTE) The Coronavirus on the Respiratory Panel, DOES NOT test for the novel  Coronavirus (2019 nCoV)    Coronavirus HKU1 NOT DETECTED NOT  DETECTED Final   Coronavirus NL63 NOT DETECTED NOT DETECTED Final   Coronavirus OC43 NOT DETECTED NOT DETECTED Final   Metapneumovirus NOT DETECTED NOT DETECTED Final   Rhinovirus / Enterovirus NOT DETECTED NOT DETECTED Final   Influenza A NOT DETECTED NOT DETECTED Final   Influenza B NOT DETECTED NOT DETECTED Final   Parainfluenza Virus 1 NOT DETECTED NOT DETECTED Final   Parainfluenza Virus 2 NOT DETECTED NOT DETECTED Final   Parainfluenza Virus 3 NOT DETECTED NOT DETECTED Final   Parainfluenza Virus 4 NOT DETECTED NOT DETECTED Final   Respiratory Syncytial Virus NOT DETECTED NOT DETECTED Final   Bordetella pertussis NOT DETECTED NOT DETECTED Final   Bordetella Parapertussis NOT DETECTED NOT DETECTED Final   Chlamydophila pneumoniae NOT DETECTED NOT DETECTED Final   Mycoplasma pneumoniae NOT DETECTED NOT DETECTED Final    Comment: Performed at [facility], [place] [address], [place], [place] [address]     Time coordinating discharge:  I have spent 35 minutes face to face with the patient and on the ward discussing the patients care,  assessment, plan and disposition with other care givers. >50% of the time was devoted counseling the patient about the risks and benefits of treatment/Discharge disposition and coordinating care.   SIGNED:   Damita Lack, MD  [facility] 11/13/2021, 2:32 PM   If 7PM-7AM, please contact night-coverage

## 2021-11-13 NOTE — Progress Notes (Signed)
PROGRESS NOTE    Renee Waters  WCH:[id number] DOB: Mar 21, 1945 DOA: 11/10/2021 PCP: [name], MD   Brief Narrative:  76 year old with history of lupus, HTN, GERD, chronic pain, insomnia, depression, anxiety, CKD stage IIIb, myeloproliferative neoplasm admitted for chronic cough and shortness of breath.  Cough has been ongoing for 4 weeks treated with Augmentin but developed diarrhea.  Cultures from 9/19 grew Pseudomonas and stenotrophomonas.  CT chest showed bilateral cylindrical bronchiectasis, emphysema, bronchomalacia.  Started on IV meropenem and p.o. Bactrim.  Pulmonary team was consulted.  COVID and flu were negative.  Hemoglobin slowly drifted down therefore 1 unit PRBC transfusion.  Hemoglobin appropriately responded, renal function slowly trending up.   Assessment & Plan:  Principal Problem:   Pneumonia Active Problems:   Depression   Labile hypertension   GERD (gastroesophageal reflux disease)   Chronic midline low back pain with sciatica   Insomnia secondary to depression with anxiety   Systemic lupus (HCC)   Anemia, normocytic normochromic   CKD (chronic kidney disease) stage 3, GFR 30-59 ml/min (HCC)   Anxiety   MPN (myeloproliferative neoplasm) (HCC)   Bronchiectasis assoc with possible tracheomalacia   Acute respiratory distress secondary to bronchopneumonia, immunocompromised -Patient has failed outpatient treatment with Augmentin.  Cultures from 9/19 grew Pseudomonas, stenotrophomonas.  ProCal 0.17, BNP 168 - On IV meropenem and p.o. Bactrim-adjust to renal function, will try and make home arrangements for IV antibiotic - Bronchodilators, I-S/flutter valve - Flu/COVID-negative.  Respiratory panel negative   Diarrhea -Due to recent antibiotic use.  But states this is resolved now  Labile hypertension -Currently on metoprolol 25 mg twice daily.  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  Not on any lupus medications?   Creatinine trended up to 1.5 this morning.  We will give small fluid bolus and repeat blood work.    Anemia Thrombocytopenia Myeloproliferative neoplasm -one biopsy consistent with granulocytic proliferation: CMML versus myeloproliferative disorder; with further evaluation more suggestive of myeloproliferative neoplasm.  Known splenomegaly.  Jakafi on hold due to active infection.  Follows with outpatient [name] -Hemoglobin drifted down, will transfuse 1 unit PRBC.  Gets periodic transfusion outpatient - Continue allopurinol, oxycodone   GERD -Protonix twice daily   Insomnia, depression, anxiety -Celexa, Remeron      Chronic pain -Continue home oxycodone.    DVT prophylaxis: SCDs Start: 11/10/21 2332 Code Status: Full code Family Communication: Son on the phone while I was in the room Patient getting small fluid bolus today, she had been restricting herself over last 24 hours.  After fluid bolus if renal function has improved she may build to go home otherwise hopefully next 24-48 hours  Subjective: Overall feeling okay, no complaints  Examination: Constitutional: Not in acute distress Respiratory: Clear to auscultation bilaterally Cardiovascular: Normal sinus rhythm, no rubs Abdomen: Nontender nondistended good bowel sounds Musculoskeletal: No edema noted Skin: No rashes seen Neurologic: CN 2-12 grossly intact.  And nonfocal Psychiatric: Normal judgment and insight. Alert and oriented x 3. Normal mood.  Chest wall port in place  Objective: Vitals:   11/12/21 2228 11/12/21 2249 11/13/21 0039 11/13/21 0722  BP: (!) 142/71 (!) 149/69 (!) 131/58 (!) 146/72  Pulse: 78 79 80 86  Resp: '18 18 16 18  '$ Temp: 98.5 F (36.9 C) 98.6 F (37 C) 97.7 F (36.5 C) 98.5 F (36.9 C)  TempSrc: Oral Oral Axillary Oral  SpO2: 100% 95% 98% 95%  Weight:  Height:        Intake/Output Summary (Last 24 hours) at 11/13/2021 1203 Last data filed at 11/13/2021 0900 Gross per  24 hour  Intake 1120 ml  Output 1450 ml  Net -330 ml   Filed Weights   11/11/21 2042  Weight: 57.4 kg     Data Reviewed:   CBC: Recent Labs  Lab 11/10/21 1830 11/11/21 0410 11/12/21 0502 11/13/21 0500  WBC 123.9* 132.6* 116.1* 105.6*  NEUTROABS 78.1*  --   --   --   HGB 8.2* 8.1* 7.5* 8.5*  HCT 24.9* 25.1* 23.3* 26.7*  MCV 92.2 91.9 92.5 89.3  PLT 77* 85* 70* 57*   Basic Metabolic Panel: Recent Labs  Lab 11/10/21 1830 11/11/21 0410 11/12/21 0502 11/13/21 0500  NA 141 143 139 140  K 3.9 3.9 4.1 4.1  CL 109 111 108 111  CO2 '23 24 22 '$ 21*  GLUCOSE 118* 84 88 95  BUN '23 18 14 12  '$ CREATININE 1.18* 1.24* 1.47* 1.57*  CALCIUM 8.6* 8.5* 8.8* 8.5*  MG  --   --  1.7 1.8   GFR: Estimated Creatinine Clearance: 27.9 mL/min (A) (by C-G formula based on SCr of 1.57 mg/dL (H)). Liver Function Tests: Recent Labs  Lab 11/10/21 1830 11/11/21 0410  AST 37 39  ALT 25 22  ALKPHOS 160* 155*  BILITOT 0.6 0.7  PROT 7.3 7.4  ALBUMIN 3.7 3.8   No results for input(s): "LIPASE", "AMYLASE" in the last 168 hours. No results for input(s): "AMMONIA" in the last 168 hours. Coagulation Profile: No results for input(s): "INR", "PROTIME" in the last 168 hours. Cardiac Enzymes: No results for input(s): "CKTOTAL", "CKMB", "CKMBINDEX", "TROPONINI" in the last 168 hours. BNP (last 3 results) No results for input(s): "PROBNP" in the last 8760 hours. HbA1C: No results for input(s): "HGBA1C" in the last 72 hours. CBG: No results for input(s): "GLUCAP" in the last 168 hours. Lipid Profile: No results for input(s): "CHOL", "HDL", "LDLCALC", "TRIG", "CHOLHDL", "LDLDIRECT" in the last 72 hours. Thyroid Function Tests: No results for input(s): "TSH", "T4TOTAL", "FREET4", "T3FREE", "THYROIDAB" in the last 72 hours. Anemia Panel: No results for input(s): "VITAMINB12", "FOLATE", "FERRITIN", "TIBC", "IRON", "RETICCTPCT" in the last 72 hours. Sepsis Labs: Recent Labs  Lab 11/10/21 1830  11/10/21 2104 11/11/21 2112  PROCALCITON  --   --  0.17  LATICACIDVEN 1.6 1.0  --     Recent Results (from the past 240 hour(s))  Blood culture (routine x 2)     Status: None (Preliminary result)   Collection Time: 11/10/21  6:30 PM   Specimen: BLOOD  Result Value Ref Range Status   Specimen Description BLOOD SITE NOT SPECIFIED  Final   Special Requests   Final    BOTTLES DRAWN AEROBIC AND ANAEROBIC Blood Culture adequate volume   Culture   Final    NO GROWTH 3 DAYS Performed at [facility], [address] [place], [place] [address]    Report Status PENDING  Incomplete  Resp Panel by RT-PCR (Flu A&B, Covid) Anterior Nasal Swab     Status: None   Collection Time: 11/10/21  9:11 PM   Specimen: Anterior Nasal Swab  Result Value Ref Range Status   SARS Coronavirus 2 by RT PCR NEGATIVE NEGATIVE Final    Comment: (NOTE) SARS-CoV-2 target nucleic acids are NOT DETECTED.  The SARS-CoV-2 RNA is generally detectable in upper respiratory specimens during the acute phase of infection. The lowest concentration of SARS-CoV-2 viral copies this  assay can detect is 138 copies/mL. A negative result does not preclude SARS-Cov-2 infection and should not be used as the sole basis for treatment or other patient management decisions. A negative result may occur with  improper specimen collection/handling, submission of specimen other than nasopharyngeal swab, presence of viral mutation(s) within the areas targeted by this assay, and inadequate number of viral copies(<138 copies/mL). A negative result must be combined with clinical observations, patient history, and epidemiological information. The expected result is Negative.  Fact Sheet for Patients:  EntrepreneurPulse.com.au  Fact Sheet for Healthcare Providers:  IncredibleEmployment.be  This test is no t yet approved or cleared by the [place] FDA and  has been authorized for detection  and/or diagnosis of SARS-CoV-2 by FDA under an Emergency Use Authorization (EUA). This EUA will remain  in effect (meaning this test can be used) for the duration of the COVID-19 declaration under Section 564(b)(1) of the Act, 21 U.S.C.section 360bbb-3(b)(1), unless the authorization is terminated  or revoked sooner.       Influenza A by PCR NEGATIVE NEGATIVE Final   Influenza B by PCR NEGATIVE NEGATIVE Final    Comment: (NOTE) The Xpert Xpress SARS-CoV-2/FLU/RSV plus assay is intended as an aid in the diagnosis of influenza from Nasopharyngeal swab specimens and should not be used as a sole basis for treatment. Nasal washings and aspirates are unacceptable for Xpert Xpress SARS-CoV-2/FLU/RSV testing.  Fact Sheet for Patients: EntrepreneurPulse.com.au  Fact Sheet for Healthcare Providers: IncredibleEmployment.be  This test is not yet approved or cleared by the [place] FDA and has been authorized for detection and/or diagnosis of SARS-CoV-2 by FDA under an Emergency Use Authorization (EUA). This EUA will remain in effect (meaning this test can be used) for the duration of the COVID-19 declaration under Section 564(b)(1) of the Act, 21 U.S.C. section 360bbb-3(b)(1), unless the authorization is terminated or revoked.  Performed at [facility], [place] [address], [place], [place] [address]   Respiratory (~20 pathogens) panel by PCR     Status: None   Collection Time: 11/11/21  7:42 AM   Specimen: Nasopharyngeal Swab; Respiratory  Result Value Ref Range Status   Adenovirus NOT DETECTED NOT DETECTED Final   Coronavirus 229E NOT DETECTED NOT DETECTED Final    Comment: (NOTE) The Coronavirus on the Respiratory Panel, DOES NOT test for the novel  Coronavirus (2019 nCoV)    Coronavirus HKU1 NOT DETECTED NOT DETECTED Final   Coronavirus NL63 NOT DETECTED NOT DETECTED Final   Coronavirus OC43 NOT DETECTED NOT DETECTED Final    Metapneumovirus NOT DETECTED NOT DETECTED Final   Rhinovirus / Enterovirus NOT DETECTED NOT DETECTED Final   Influenza A NOT DETECTED NOT DETECTED Final   Influenza B NOT DETECTED NOT DETECTED Final   Parainfluenza Virus 1 NOT DETECTED NOT DETECTED Final   Parainfluenza Virus 2 NOT DETECTED NOT DETECTED Final   Parainfluenza Virus 3 NOT DETECTED NOT DETECTED Final   Parainfluenza Virus 4 NOT DETECTED NOT DETECTED Final   Respiratory Syncytial Virus NOT DETECTED NOT DETECTED Final   Bordetella pertussis NOT DETECTED NOT DETECTED Final   Bordetella Parapertussis NOT DETECTED NOT DETECTED Final   Chlamydophila pneumoniae NOT DETECTED NOT DETECTED Final   Mycoplasma pneumoniae NOT DETECTED NOT DETECTED Final    Comment: Performed at [facility], [place]. [address], [place], [place] [address]         Radiology Studies: No results found.      Scheduled Meds:  allopurinol  300  mg Oral Daily   Chlorhexidine Gluconate Cloth  6 each Topical Daily   citalopram  20 mg Oral Daily   dextromethorphan-guaiFENesin  1 tablet Oral BID   metoprolol tartrate  25 mg Oral BID   mirtazapine  15 mg Oral QHS   pantoprazole  40 mg Oral BID   sodium chloride flush  3 mL Intravenous Q12H   sulfamethoxazole-trimethoprim  1 tablet Oral Q12H   Continuous Infusions:  meropenem (MERREM) IV 1 g (11/13/21 1123)     LOS: 2 days   Time spent= 35 mins    Heidee Audi Arsenio Loader, MD [facility]  If 7PM-7AM, please contact night-coverage  11/13/2021, 12:03 PM

## 2021-11-13 NOTE — TOC Transition Note (Signed)
Transition of Care [facility]) - CM/SW Discharge Note   Patient Details  Name: Renee Waters MRN: [id number] Date of Birth: 12-28-1945  [facility] CM/SW Contact:  Bartholomew Crews, RN Phone Number: [phone] 11/13/2021, 2:50 PM   Clinical Narrative:     Patient to transition home today. Spoke with patient on the hospital room phone. She is prepared to transition home and has transportation. She stated her antibiotics were delivered to the home this morning. Communicated with liaisons at [employer] of discharge. No further TOC needs identified at this time.   Final next level of care: [place] Barriers to Discharge: No Barriers Identified   Patient Goals and CMS Choice Patient states their goals for this hospitalization and ongoing recovery are:: Get better and return home with home health CMS Medicare.gov Compare Post Acute Care list provided to:: Patient Choice offered to / list presented to : Patient  Discharge Placement                       Discharge Plan and Services   Discharge Planning Services: CM Consult Post Acute Care Choice: Durable Medical Equipment, Home Health          DME Arranged: Nebulizer machine DME Agency: [facility] Date DME Agency Contacted: 11/12/21 Time DME Agency Contacted: 1215 Representative spoke with at DME Agency: Jeanella Anton, [place] with [employer] HH Arranged: RN [place] Agency: [place] ([address]), [employer] Date HH Agency Contacted: 11/13/21 Time [place]: 1450 Representative spoke with at [place]: Corene Cornea at [place] at [place] ([place]) Interventions     Readmission Risk Interventions    11/12/2021    1:49 PM  Readmission Risk Prevention Plan  Transportation Screening Complete  PCP or Specialist Appt within 3-5 Days Complete  HRI or [place] Complete  Social Work Consult for [place] Planning/Counseling Complete  Palliative  Care Screening Complete  Medication Review Press photographer) Complete

## 2021-11-14 ENCOUNTER — Other Ambulatory Visit (HOSPITAL_COMMUNITY): Payer: Self-pay | Admitting: Hematology

## 2021-11-14 DIAGNOSIS — D696 Thrombocytopenia, unspecified: Secondary | ICD-10-CM | POA: Diagnosis not present

## 2021-11-14 DIAGNOSIS — J151 Pneumonia due to Pseudomonas: Secondary | ICD-10-CM | POA: Diagnosis not present

## 2021-11-14 DIAGNOSIS — M3214 Glomerular disease in systemic lupus erythematosus: Secondary | ICD-10-CM | POA: Diagnosis not present

## 2021-11-14 DIAGNOSIS — Z9181 History of falling: Secondary | ICD-10-CM | POA: Diagnosis not present

## 2021-11-14 DIAGNOSIS — N1832 Chronic kidney disease, stage 3b: Secondary | ICD-10-CM | POA: Diagnosis not present

## 2021-11-14 DIAGNOSIS — M1990 Unspecified osteoarthritis, unspecified site: Secondary | ICD-10-CM | POA: Diagnosis not present

## 2021-11-14 DIAGNOSIS — F5105 Insomnia due to other mental disorder: Secondary | ICD-10-CM | POA: Diagnosis not present

## 2021-11-14 DIAGNOSIS — M542 Cervicalgia: Secondary | ICD-10-CM | POA: Diagnosis not present

## 2021-11-14 DIAGNOSIS — D849 Immunodeficiency, unspecified: Secondary | ICD-10-CM | POA: Diagnosis not present

## 2021-11-14 DIAGNOSIS — I10 Essential (primary) hypertension: Secondary | ICD-10-CM | POA: Diagnosis not present

## 2021-11-14 DIAGNOSIS — G8929 Other chronic pain: Secondary | ICD-10-CM | POA: Diagnosis not present

## 2021-11-14 DIAGNOSIS — N179 Acute kidney failure, unspecified: Secondary | ICD-10-CM | POA: Diagnosis not present

## 2021-11-14 DIAGNOSIS — K2270 Barrett's esophagus without dysplasia: Secondary | ICD-10-CM | POA: Diagnosis not present

## 2021-11-14 DIAGNOSIS — Z792 Long term (current) use of antibiotics: Secondary | ICD-10-CM | POA: Diagnosis not present

## 2021-11-14 DIAGNOSIS — C946 Myelodysplastic disease, not classified: Secondary | ICD-10-CM | POA: Diagnosis not present

## 2021-11-14 DIAGNOSIS — F419 Anxiety disorder, unspecified: Secondary | ICD-10-CM | POA: Diagnosis not present

## 2021-11-14 DIAGNOSIS — J439 Emphysema, unspecified: Secondary | ICD-10-CM | POA: Diagnosis not present

## 2021-11-14 DIAGNOSIS — J9809 Other diseases of bronchus, not elsewhere classified: Secondary | ICD-10-CM | POA: Diagnosis not present

## 2021-11-14 DIAGNOSIS — D631 Anemia in chronic kidney disease: Secondary | ICD-10-CM | POA: Diagnosis not present

## 2021-11-14 DIAGNOSIS — K219 Gastro-esophageal reflux disease without esophagitis: Secondary | ICD-10-CM | POA: Diagnosis not present

## 2021-11-14 DIAGNOSIS — Z5111 Encounter for antineoplastic chemotherapy: Secondary | ICD-10-CM | POA: Diagnosis not present

## 2021-11-14 DIAGNOSIS — Z452 Encounter for adjustment and management of vascular access device: Secondary | ICD-10-CM | POA: Diagnosis not present

## 2021-11-14 DIAGNOSIS — M545 Low back pain, unspecified: Secondary | ICD-10-CM | POA: Diagnosis not present

## 2021-11-14 DIAGNOSIS — F32A Depression, unspecified: Secondary | ICD-10-CM | POA: Diagnosis not present

## 2021-11-14 DIAGNOSIS — J479 Bronchiectasis, uncomplicated: Secondary | ICD-10-CM | POA: Diagnosis not present

## 2021-11-15 ENCOUNTER — Other Ambulatory Visit (HOSPITAL_COMMUNITY): Payer: Self-pay

## 2021-11-15 ENCOUNTER — Telehealth: Payer: Self-pay

## 2021-11-15 LAB — CULTURE, BLOOD (ROUTINE X 2)
Culture: NO GROWTH
Special Requests: ADEQUATE

## 2021-11-15 NOTE — Telephone Encounter (Signed)
Transition Care Management Follow-up Telephone Call Date of discharge and from where: 11/13/21 How have you been since you were released from the hospital? Tired Any questions or concerns? Yes  Items Reviewed: Did the pt receive and understand the discharge instructions provided? Yes  Medications obtained and verified? Yes  Other?  N/a Any new allergies since your discharge? No  Dietary orders reviewed? Yes Do you have support at home? Yes   Home Care and Equipment/Supplies: Were home health services ordered? N/a If so, what is the name of the agency? N/a  Has the agency set up a time to come to the patient's home? not applicable Were any new equipment or medical supplies ordered?  No What is the name of the medical supply agency? N/a Were you able to get the supplies/equipment? not applicable Do you have any questions related to the use of the equipment or supplies? No  Functional Questionnaire: (I = Independent and D = Dependent) ADLs: i  Bathing/Dressing- i  Meal Prep- i  Eating- i  Maintaining continence- i  Transferring/Ambulation- i  Managing Meds- i  Follow up appointments reviewed:  PCP Hospital f/u appt confirmed?  [facility] f/u appt confirmed? Yes   Are transportation arrangements needed? No  If their condition worsens, is the pt aware to call PCP or go to the Emergency Dept.? Yes Was the patient provided with contact information for the PCP's office or ED? Yes Was to pt encouraged to call back with questions or concerns? Yes

## 2021-11-16 ENCOUNTER — Encounter: Payer: Self-pay | Admitting: Internal Medicine

## 2021-11-16 ENCOUNTER — Encounter: Payer: Self-pay | Admitting: Family Medicine

## 2021-11-16 ENCOUNTER — Ambulatory Visit (INDEPENDENT_AMBULATORY_CARE_PROVIDER_SITE_OTHER): Payer: Medicare Other | Admitting: Internal Medicine

## 2021-11-16 ENCOUNTER — Other Ambulatory Visit (HOSPITAL_COMMUNITY): Payer: Self-pay

## 2021-11-16 ENCOUNTER — Telehealth: Payer: Self-pay | Admitting: Internal Medicine

## 2021-11-16 DIAGNOSIS — J209 Acute bronchitis, unspecified: Secondary | ICD-10-CM

## 2021-11-16 DIAGNOSIS — N1832 Chronic kidney disease, stage 3b: Secondary | ICD-10-CM | POA: Diagnosis not present

## 2021-11-16 DIAGNOSIS — J9809 Other diseases of bronchus, not elsewhere classified: Secondary | ICD-10-CM | POA: Diagnosis not present

## 2021-11-16 DIAGNOSIS — J189 Pneumonia, unspecified organism: Secondary | ICD-10-CM | POA: Diagnosis not present

## 2021-11-16 DIAGNOSIS — J479 Bronchiectasis, uncomplicated: Secondary | ICD-10-CM

## 2021-11-16 DIAGNOSIS — Z792 Long term (current) use of antibiotics: Secondary | ICD-10-CM | POA: Diagnosis not present

## 2021-11-16 DIAGNOSIS — J151 Pneumonia due to Pseudomonas: Secondary | ICD-10-CM | POA: Diagnosis not present

## 2021-11-16 DIAGNOSIS — J439 Emphysema, unspecified: Secondary | ICD-10-CM | POA: Diagnosis not present

## 2021-11-16 DIAGNOSIS — M3214 Glomerular disease in systemic lupus erythematosus: Secondary | ICD-10-CM | POA: Diagnosis not present

## 2021-11-16 LAB — TYPE AND SCREEN
ABO/RH(D): O POS
Antibody Screen: POSITIVE
DAT, IgG: POSITIVE
Donor AG Type: NEGATIVE
Donor AG Type: NEGATIVE
Unit division: 0
Unit division: 0

## 2021-11-16 LAB — BPAM RBC
Blood Product Expiration Date: 202310312359
Blood Product Expiration Date: 202310312359
ISSUE DATE / TIME: 202310062223
Unit Type and Rh: 5100
Unit Type and Rh: 5100

## 2021-11-16 MED ORDER — ALBUTEROL SULFATE HFA 108 (90 BASE) MCG/ACT IN AERS: 2.0000 | INHALATION_SPRAY | RESPIRATORY_TRACT | 11 refills | Status: DC | PRN

## 2021-11-16 MED ORDER — BUDESONIDE-FORMOTEROL FUMARATE 80-4.5 MCG/ACT IN AERO: INHALATION_SPRAY | RESPIRATORY_TRACT | 11 refills | Status: DC

## 2021-11-16 MED ORDER — ALBUTEROL SULFATE (2.5 MG/3ML) 0.083% IN NEBU: 2.5000 mg | INHALATION_SOLUTION | RESPIRATORY_TRACT | 12 refills | Status: DC | PRN

## 2021-11-16 NOTE — Telephone Encounter (Signed)
Will make Dr. [name] aware so we can discuss with patient during ov today.  Will call daughter to inform if she is not present during appt today.

## 2021-11-16 NOTE — Progress Notes (Unsigned)
Renee Waters, female    DOB: [birth year]    MRN: [id number]   Brief patient profile:  60   yobf  quit smoking 2004 with dx of myelofibrosis referred to pulmonary clinic in [place]  11/01/2021 by Dr Renee Waters  for cough/sob since August 2023     History of Present Illness  11/01/2021  Pulmonary/ 1st office eval/ Renee Waters / [facility]  Chief Complaint  Patient presents with   Consult    Cough for several months. Urgent referral from Dr. Moshe Waters PCP   "Myeloprolifertive malignancy with markedly increased WBC."    Dyspnea:  slower than others but can do [employer] walking = MMRC2 = can't walk a nl pace on a flat grade s sob but does fine slow and flat   Cough: lots of mucus in am greyand thick / assoc nasal congestion also worse in am  Sleep: 30 degrees  SABA use: advair x 1 week not helping, does not have saba Rec Symbicort  80 Take 2 puffs first thing in am and then another 2 puffs about 12 hours later.  Work on inhaler technique:   Change protonix to 40 mg Take 30- 60 min before your first and last meals of the day  Prednisone 10 mg take  4 each am x 2 days,   2 each am x 2 days,  1 each am x 2 days and stop  Augmentin 875 mg take one pill twice daily  X 10 days For cough > mucinex DM 1200 every 12 hours and supplement with oxyIR  59m up to 1-2 hours every 4 hours as needed   Please schedule a follow up office visit in 2 weeks, sooner if needed  with all medications /inhalers/ solutions in hand   Admit date: 11/10/2021 Discharge date: 11/13/2021   Admitted From: HOME Disposition:  HOME   Recommendations for Outpatient Follow-up:  Follow up with PCP in 1-2 weeks Please obtain BMP/CBC in one week your next doctors visit.  Home with IV Meropenem and Bactrim PO BID to complete total 14 day course. IV Abx to be infused via her chemo port. [facility] arranged to help manage her port at home. Family educated for medication infusion.  Hold Jafaki during active infection. Follow up  outpatient Onc in the next 7-10 days for further instructions.  Follow up outpatient Pulm per their service.    Home Health: RN Equipment/Devices: IV Abx at home Discharge Condition: Stable CODE STATUS: Full Diet recommendation: 2g Na.    Brief/Interim Summary: 76year old with history of lupus, HTN, GERD, chronic pain, insomnia, depression, anxiety, CKD stage IIIb, myeloproliferative neoplasm admitted for chronic cough and shortness of breath.  Cough has been ongoing for 4 weeks treated with Augmentin but developed diarrhea.  Cultures from 9/19 grew Pseudomonas and stenotrophomonas.  CT chest showed bilateral cylindrical bronchiectasis, emphysema, bronchomalacia.  Started on IV meropenem and p.o. Bactrim.  Pulmonary team was consulted.  COVID and flu were negative.  Hemoglobin slowly drifted down therefore 1 unit PRBC transfusion.  Hemoglobin appropriately responded, renal function slowly trending up.  Patient was seen by Pulm, over the course of 3 days her symptoms improved. It was recommended she stays on IV Meropenem and PO Bactrim for total of 2 weeks, therefore arrangements were made. Due to low Hb, she did get PRBC transfusion in the hospital as she has in the past outpatient. She developed mild AKI during hospitalization, but improved with fluids.  She is stable for dc with recs as  above.     Assessment & Plan:  Principal Problem:   Pneumonia   Depression   Labile hypertension   GERD (gastroesophageal reflux disease)   Chronic midline low back pain with sciatica   Insomnia secondary to depression with anxiety   Systemic lupus (HCC)   Anemia, normocytic normochromic   CKD (chronic kidney disease) stage 3, GFR 30-59 ml/min (HCC)   Anxiety   MPN (myeloproliferative neoplasm) (HCC)   Bronchiectasis assoc with possible tracheomalacia   Acute respiratory distress secondary to bronchopneumonia, immunocompromised -Patient has failed outpatient treatment with Augmentin.  Cultures from  9/19 grew Pseudomonas, stenotrophomonas.  ProCal 0.17, BNP 168 - On IV meropenem and p.o. Bactrim to complete total 14 days. Has Chemo port which can be used. [facility] RN to help manage it at home and change needle when approrpaite. Follow up outpatient Pulm - Bronchodilators, I-S/flutter valve - Flu/COVID-negative.  Respiratory panel negative   Diarrhea -Due to recent antibiotic use.  But states this is resolved now   Labile hypertension -Currently on metoprolol 25 mg twice daily.  -   AKI CKD 3B secondary to lupus; improved.  -Baseline creatinine 1.2.  Not on any lupus medications?  peaked at 1.5, no improved with lfluids. Advised oral hydration at home.      Anemia Thrombocytopenia Myeloproliferative neoplasm -one biopsy consistent with granulocytic proliferation: CMML versus myeloproliferative disorder; with further evaluation more suggestive of myeloproliferative neoplasm.  Known splenomegaly.  Jakafi on hold due to active infection.  Follows with outpatient Dr. Delton Waters -Hemoglobin drifted down, s/p 1 unit PRBC.  Gets periodic transfusion outpatient. Hb stable. Follow up outpatient. Renee Waters can be resumed when ok with Onc outpatient - Continue allopurinol, oxycodone   GERD -Protonix twice daily   Insomnia, depression, anxiety -Celexa, Remeron       Chronic pain -Continue home oxycodone.        11/16/2021  f/u ov/[place] office/Renee Waters re: bronchiectasis  maint on no resp rx   Chief Complaint  Patient presents with   Follow-up    Needs nebulizer medication was sent home from hospital with nebulizer but no medication.  [facility] admission 10/4-10/7  Dyspnea:  no outdoor walking  Cough: feels sensation of mucus in throat  Sleeping: high pillow  SABA use: none now  02: none      No obvious day to day or daytime variability or assoc excess/ purulent sputum or mucus plugs or hemoptysis or cp or chest tightness, subjective wheeze or overt sinus or hb symptoms.   *** without  nocturnal  or early am exacerbation  of respiratory  c/o's or need for noct saba. Also denies any obvious fluctuation of symptoms with weather or environmental changes or other aggravating or alleviating factors except as outlined above   No unusual exposure hx or h/o childhood pna/ asthma or knowledge of premature birth.  Current Allergies, Complete Past Medical History, Past Surgical History, Family History, and Social History were reviewed in [employer] record.  ROS  The following are not active complaints unless bolded Hoarseness, sore throat, dysphagia, dental problems, itching, sneezing,  nasal congestion or discharge of excess mucus or purulent secretions, ear ache,   fever, chills, sweats, unintended wt loss or wt gain, classically pleuritic or exertional cp,  orthopnea pnd or arm/hand swelling  or leg swelling, presyncope, palpitations, abdominal pain, anorexia, nausea, vomiting, diarrhea  or change in bowel habits or change in bladder habits, change in stools or change in urine, dysuria, hematuria,  rash, arthralgias, visual  complaints, headache, numbness, weakness or ataxia or problems with walking or coordination,  change in mood or  memory.        Current Meds  Medication Sig   acyclovir (ZOVIRAX) 800 MG tablet Take 1 tablet (800 mg total) by mouth 5 (five) times daily.   acyclovir ointment (ZOVIRAX) 5 % Apply 1 application. topically every 3 (three) hours.   albuterol (VENTOLIN HFA) 108 (90 Base) MCG/ACT inhaler Inhale 2 puffs into the lungs every 6 (six) hours as needed for wheezing or shortness of breath.   allopurinol (ZYLOPRIM) 300 MG tablet Take 1 tablet (300 mg total) by mouth daily.   budesonide-formoterol (SYMBICORT) 80-4.5 MCG/ACT inhaler Take 2 puffs first thing in am and then another 2 puffs about 12 hours later.   citalopram (CELEXA) 20 MG tablet Take 1 tablet by mouth once daily   EPINEPHRINE 0.3 mg/0.3 mL IJ SOAJ injection INJECT 0.3 MLS INTO  MUSCLE ONCE AS NEEDED (Patient taking differently: Inject 0.3 mg into the muscle as needed for anaphylaxis.)   furosemide (LASIX) 20 MG tablet Take 1 tablet (20 mg total) by mouth daily as needed.   gabapentin (NEURONTIN) 300 MG capsule Take one capsule by mouth once daily, as needed, for pain (Patient taking differently: Take 300 mg by mouth daily as needed (Pain).)   lidocaine-prilocaine (EMLA) cream Apply 1 Application topically as needed (Access to port and pain).   meropenem (MERREM) IVPB Inject 1 g into the vein every 12 (twelve) hours for 11 days. Indication:  Pseudomonas pneumonia First Dose: Yes Last Day of Therapy:  11/24/2021 Labs - Once weekly:  CBC/D and BMP, Labs - Every other week:  ESR and CRP Method of administration: Mini-Bag Plus / Gravity Method of administration may be changed at the discretion of home infusion pharmacist based upon assessment of the patient and/or caregiver's ability to self-administer the medication ordered.   metoprolol tartrate (LOPRESSOR) 25 MG tablet Take 1 tablet (25 mg total) by mouth 2 (two) times daily.   mirtazapine (REMERON) 15 MG tablet Take 1 tablet (15 mg total) by mouth at bedtime.   Multiple Vitamin (MULTIVITAMIN WITH MINERALS) TABS tablet Take 1 tablet by mouth every other day.   oxyCODONE (OXY IR/ROXICODONE) 5 MG immediate release tablet Take 5 mg by mouth every 4 (four) hours as needed for moderate pain, severe pain or breakthrough pain.   pantoprazole (PROTONIX) 40 MG tablet Take 1 tablet (40 mg total) by mouth 2 (two) times daily before a meal.   potassium chloride (KLOR-CON) 10 MEQ tablet Take 1 tablet by mouth once daily   ruxolitinib phosphate (JAKAFI) 15 MG tablet Take 1 tablet (15 mg total) by mouth 2 (two) times daily.   sulfamethoxazole-trimethoprim (BACTRIM DS) 800-160 MG tablet Take 1 tablet by mouth every 12 (twelve) hours for 11 days.   temazepam (RESTORIL) 30 MG capsule Take 1 capsule (30 mg total) by mouth at bedtime as  needed for sleep.   tiZANidine (ZANAFLEX) 4 MG tablet Take 2 tablets (8 mg total) by mouth 3 (three) times daily as needed (spasms). (Patient taking differently: Take 4-8 mg by mouth 3 (three) times daily as needed for muscle spasms.)                  Past Medical History:  Diagnosis Date   Acute cholangitis    Allergy    Anemia    Anxiety    Arthritis    [employer] 03/18/2020   Barrett's esophagus    Cataract  Chronic back pain    Chronic neck pain    CKD (chronic kidney disease) stage 3, GFR 30-59 ml/min (HCC) 10/29/2020   Depression    Genital herpes    GERD (gastroesophageal reflux disease)    H/O degenerative disc disease    History of blood transfusion    Hypertension    Insomnia    Lupus (systemic lupus erythematosus) ([place])    Neuromuscular disorder ([place])    Osteoarthritis    S/P colonoscopy June 2005   normal, no polyps   S/P endoscopy June 2005, Oct 2009   2005: short-segment Barrett's, 2009: short-segment Barrett's   Upper respiratory tract infection 07/09/2020   UTI (lower urinary tract infection) 11/2012       Objective:     Wt Readings from Last 3 Encounters:  11/16/21 133 lb 12.8 oz (60.7 kg)  11/11/21 126 lb 8.7 oz (57.4 kg)  11/10/21 126 lb 9.6 oz (57.4 kg)      Vital signs reviewed  11/16/2021  - Note at rest 02 sats  ***% on ***   General appearance:    ***      Min bar ***           Assessment

## 2021-11-16 NOTE — Patient Instructions (Addendum)
For cough / congestion >  mucinex dm 1200 mg every 12 hours as needed and use the flutter valve as much as possible   Plan A = Automatic = Always=    Symbicort 80 Take 2 puffs first thing in am and then another 2 puffs about 12 hours later.    Work on inhaler technique:  relax and gently blow all the way out then take a nice smooth full deep breath back in, triggering the inhaler at same time you start breathing in.  Hold breath in for at least  5 seconds if you can. Blow out symbicort  thru nose. Rinse and gargle with water when done.  If mouth or throat bother you at all,  try brushing teeth/gums/tongue with arm and hammer toothpaste/ make a slurry and gargle and spit out.    Plan B = Backup (to supplement plan A, not to replace it) Only use your albuterol inhaler as a rescue medication to be used if you can't catch your breath by resting or doing a relaxed purse lip breathing pattern.  - The less you use it, the better it will work when you need it. - Ok to use the inhaler up to 2 puffs  every 4 hours if you must but call for appointment if use goes up over your usual need - Don't leave home without it !!  (think of it like the spare tire for your car)   Plan C = Crisis (instead of Plan B but only if Plan B stops working) - only use your albuterol nebulizer if you first try Plan B and it fails to help > ok to use the nebulizer up to every 4 hours but if start needing it regularly call for immediate appointment   Finish all antibiotics   Please schedule a follow up office visit in 4 weeks, sooner if needed  with all respiratory devices/ medications /inhalers/ solutions in hand so we can verify exactly what you are taking. This includes all medications from all doctors and over the counters

## 2021-11-17 ENCOUNTER — Telehealth: Payer: Self-pay | Admitting: Internal Medicine

## 2021-11-17 ENCOUNTER — Encounter: Payer: Self-pay | Admitting: Internal Medicine

## 2021-11-17 ENCOUNTER — Inpatient Hospital Stay: Payer: Medicare Other | Attending: Hematology | Admitting: Hematology

## 2021-11-17 ENCOUNTER — Inpatient Hospital Stay: Payer: Medicare Other

## 2021-11-17 VITALS — Wt 132.3 lb

## 2021-11-17 DIAGNOSIS — M3214 Glomerular disease in systemic lupus erythematosus: Secondary | ICD-10-CM | POA: Insufficient documentation

## 2021-11-17 DIAGNOSIS — Z79899 Other long term (current) drug therapy: Secondary | ICD-10-CM | POA: Diagnosis not present

## 2021-11-17 DIAGNOSIS — N183 Chronic kidney disease, stage 3 unspecified: Secondary | ICD-10-CM | POA: Diagnosis not present

## 2021-11-17 DIAGNOSIS — M79662 Pain in left lower leg: Secondary | ICD-10-CM | POA: Diagnosis not present

## 2021-11-17 DIAGNOSIS — M79661 Pain in right lower leg: Secondary | ICD-10-CM | POA: Insufficient documentation

## 2021-11-17 DIAGNOSIS — J479 Bronchiectasis, uncomplicated: Secondary | ICD-10-CM

## 2021-11-17 DIAGNOSIS — Z87891 Personal history of nicotine dependence: Secondary | ICD-10-CM | POA: Diagnosis not present

## 2021-11-17 DIAGNOSIS — D471 Chronic myeloproliferative disease: Secondary | ICD-10-CM | POA: Diagnosis not present

## 2021-11-17 DIAGNOSIS — E876 Hypokalemia: Secondary | ICD-10-CM | POA: Diagnosis not present

## 2021-11-17 DIAGNOSIS — I129 Hypertensive chronic kidney disease with stage 1 through stage 4 chronic kidney disease, or unspecified chronic kidney disease: Secondary | ICD-10-CM | POA: Insufficient documentation

## 2021-11-17 DIAGNOSIS — J439 Emphysema, unspecified: Secondary | ICD-10-CM | POA: Diagnosis not present

## 2021-11-17 DIAGNOSIS — L299 Pruritus, unspecified: Secondary | ICD-10-CM | POA: Insufficient documentation

## 2021-11-17 LAB — COMPREHENSIVE METABOLIC PANEL
ALT: 41 U/L (ref 0–44)
AST: 49 U/L — ABNORMAL HIGH (ref 15–41)
Albumin: 3.4 g/dL — ABNORMAL LOW (ref 3.5–5.0)
Alkaline Phosphatase: 200 U/L — ABNORMAL HIGH (ref 38–126)
Anion gap: 6 (ref 5–15)
BUN: 27 mg/dL — ABNORMAL HIGH (ref 8–23)
CO2: 21 mmol/L — ABNORMAL LOW (ref 22–32)
Calcium: 8.2 mg/dL — ABNORMAL LOW (ref 8.9–10.3)
Chloride: 111 mmol/L (ref 98–111)
Creatinine, Ser: 1.97 mg/dL — ABNORMAL HIGH (ref 0.44–1.00)
GFR, Estimated: 26 mL/min — ABNORMAL LOW (ref >60)
Glucose, Bld: 105 mg/dL — ABNORMAL HIGH (ref 70–99)
Potassium: 4.2 mmol/L (ref 3.5–5.1)
Sodium: 138 mmol/L (ref 135–145)
Total Bilirubin: 0.5 mg/dL (ref 0.3–1.2)
Total Protein: 7 g/dL (ref 6.5–8.1)

## 2021-11-17 LAB — MAGNESIUM: Magnesium: 2 mg/dL (ref 1.7–2.4)

## 2021-11-17 LAB — CBC WITH DIFFERENTIAL/PLATELET
Abs Immature Granulocytes: 2.2 10*3/uL — ABNORMAL HIGH (ref 0.00–0.07)
Band Neutrophils: 8 %
Basophils Absolute: 0 10*3/uL (ref 0.0–0.1)
Basophils Relative: 0 %
Blasts: 1 %
Eosinophils Absolute: 0.7 10*3/uL — ABNORMAL HIGH (ref 0.0–0.5)
Eosinophils Relative: 1 %
HCT: 24.3 % — ABNORMAL LOW (ref 36.0–46.0)
Hemoglobin: 7.9 g/dL — ABNORMAL LOW (ref 12.0–15.0)
Lymphocytes Relative: 19 %
Lymphs Abs: 14.1 10*3/uL — ABNORMAL HIGH (ref 0.7–4.0)
MCH: 28.7 pg (ref 26.0–34.0)
MCHC: 32.5 g/dL (ref 30.0–36.0)
MCV: 88.4 fL (ref 80.0–100.0)
Metamyelocytes Relative: 2 %
Monocytes Absolute: 9.6 10*3/uL — ABNORMAL HIGH (ref 0.1–1.0)
Monocytes Relative: 13 %
Myelocytes: 1 %
Neutro Abs: 46.6 10*3/uL — ABNORMAL HIGH (ref 1.7–7.7)
Neutrophils Relative %: 55 %
Platelets: 45 10*3/uL — ABNORMAL LOW (ref 150–400)
RBC: 2.75 MIL/uL — ABNORMAL LOW (ref 3.87–5.11)
RDW: 22.2 % — ABNORMAL HIGH (ref 11.5–15.5)
WBC: 74 10*3/uL (ref 4.0–10.5)
nRBC: 2 % — ABNORMAL HIGH (ref 0.0–0.2)

## 2021-11-17 LAB — LACTATE DEHYDROGENASE: LDH: 567 U/L — ABNORMAL HIGH (ref 98–192)

## 2021-11-17 LAB — SAMPLE TO BLOOD BANK

## 2021-11-17 MED ORDER — ALBUTEROL SULFATE (2.5 MG/3ML) 0.083% IN NEBU: 2.5000 mg | INHALATION_SOLUTION | RESPIRATORY_TRACT | 12 refills | Status: DC | PRN

## 2021-11-17 MED ORDER — SODIUM CHLORIDE FLUSH 0.9 % IV SOLN
10.0000 mL | Freq: Once | INTRAVENOUS | Status: AC
  Administered 2021-11-17: 10 mL via INTRAVENOUS
  Filled 2021-11-17: qty 10

## 2021-11-17 MED ORDER — HEPARIN SOD (PORK) LOCK FLUSH 100 UNIT/ML IV SOLN
500.0000 [IU] | Freq: Once | INTRAVENOUS | Status: AC
  Administered 2021-11-17: 500 [IU] via INTRAVENOUS

## 2021-11-17 NOTE — Addendum Note (Signed)
Addended by: Fritzi Mandes D on: 11/17/2021 04:42 PM   Modules accepted: Orders

## 2021-11-17 NOTE — Progress Notes (Signed)
CRITICAL VALUE ALERT Critical value received:  WBC 74, hgb 7.9, platelets 45 Date of notification:  11/17/21 Time of notification: 0352 Critical value read back:  Yes.   Nurse who received alert: C. Cledis Sohn RN MD notified time and response:  1245, [name]. No new orders.

## 2021-11-17 NOTE — Patient Instructions (Signed)
Renee Waters at [facility] Discharge Instructions   You were seen and examined today by [name].  Your lab work is pending.   Continue to hold Jakafi until you complete antibiotics.  Return as scheduled in 3 weeks.    Thank you for choosing [place] at [facility] to provide your oncology and hematology care.  To afford each patient quality time with our provider, please arrive at least 15 minutes before your scheduled appointment time.   If you have a lab appointment with the [facility] please come in thru the Main Entrance and check in at the main information desk.  You need to re-schedule your appointment should you arrive 10 or more minutes late.  We strive to give you quality time with our providers, and arriving late affects you and other patients whose appointments are after yours.  Also, if you no show three or more times for appointments you may be dismissed from the clinic at the providers discretion.     Again, thank you for choosing [facility].  Our hope is that these requests will decrease the amount of time that you wait before being seen by our physicians.       _____________________________________________________________  Should you have questions after your visit to [facility], please contact our office at [phone] and follow the prompts.  Our office hours are 8:00 a.m. and 4:30 p.m. Monday - Friday.  Please note that voicemails left after 4:00 p.m. may not be returned until the following business day.  We are closed weekends and major holidays.  You do have access to a nurse 24-7, just call the main number to the clinic [phone] and do not press any options, hold on the line and a nurse will answer the phone.    For prescription refill requests, have your pharmacy contact our office and allow 72 hours.    Due to Covid, you will need to wear a mask upon entering the hospital. If  you do not have a mask, a mask will be given to you at the Main Entrance upon arrival. For doctor visits, patients may have 1 support person age 28 or older with them. For treatment visits, patients can not have anyone with them due to social distancing guidelines and our immunocompromised population.

## 2021-11-17 NOTE — Assessment & Plan Note (Addendum)
See HRCT  11/10/21 = bilateral cylindrical bronchiectasis in setting of MP disordere - sputum   cultures from 10/26/21  grew Pseudomonas and stenotrophomonas > rx 14 days bactrim/ meropenem  - 11/16/2021 added flutter valve/ neb albuterol (as plan C on action plan)  Reviewed hosp course and dx / implication of having bronchiectasis esp is setting of immunocompromised state  Reviewed ABC action plan see avs for instructions unique to this ov  Pt and spouse struggling with concepts of approp use of maint vs prns  F/u in 4 weeks with all meds in hand using a trust but verify approach to confirm accurate Medication  Reconciliation The principal here is that until we are certain that the  patients are doing what we've asked, it makes no sense to ask them to do more.          Each maintenance medication was reviewed in detail including emphasizing most importantly the difference between maintenance and prns and under what circumstances the prns are to be triggered using an action plan format where appropriate.  Total time for H and P, chart review, counseling, reviewing hfa/neb/flutter device(s) and generating customized AVS unique to this office visit / same day charting = 33 min

## 2021-11-17 NOTE — Telephone Encounter (Signed)
Flutter valve ordered through adapt

## 2021-11-17 NOTE — Patient Instructions (Signed)
[facility]  Discharge Instructions: Thank you for choosing [facility] to provide your oncology and hematology care.  If you have a lab appointment with the Cancer Center, please come in thru the Main Entrance and check in at the main information desk.  Wear comfortable clothing and clothing appropriate for easy access to any Portacath or PICC line.   We strive to give you quality time with your provider. You may need to reschedule your appointment if you arrive late (15 or more minutes).  Arriving late affects you and other patients whose appointments are after yours.  Also, if you miss three or more appointments without notifying the office, you may be dismissed from the clinic at the provider's discretion.      For prescription refill requests, have your pharmacy contact our office and allow 72 hours for refills to be completed.     To help prevent nausea and vomiting after your treatment, we encourage you to take your nausea medication as directed.  BELOW ARE SYMPTOMS THAT SHOULD BE REPORTED IMMEDIATELY: *FEVER GREATER THAN 100.4 F (38 C) OR HIGHER *CHILLS OR SWEATING *NAUSEA AND VOMITING THAT IS NOT CONTROLLED WITH YOUR NAUSEA MEDICATION *UNUSUAL SHORTNESS OF BREATH *UNUSUAL BRUISING OR BLEEDING *URINARY PROBLEMS (pain or burning when urinating, or frequent urination) *BOWEL PROBLEMS (unusual diarrhea, constipation, pain near the anus) TENDERNESS IN MOUTH AND THROAT WITH OR WITHOUT PRESENCE OF ULCERS (sore throat, sores in mouth, or a toothache) UNUSUAL RASH, SWELLING OR PAIN  UNUSUAL VAGINAL DISCHARGE OR ITCHING   Items with * indicate a potential emergency and should be followed up as soon as possible or go to the Emergency Department if any problems should occur.  Please show the CHEMOTHERAPY ALERT CARD or IMMUNOTHERAPY ALERT CARD at check-in to the Emergency Department and triage nurse.  Should you have questions after your visit or need to  cancel or reschedule your appointment, please contact [facility] [phone]  and follow the prompts.  Office hours are 8:00 a.m. to 4:30 p.m. Monday - Friday. Please note that voicemails left after 4:00 p.m. may not be returned until the following business day.  We are closed weekends and major holidays. You have access to a nurse at all times for urgent questions. Please call the main number to the clinic [phone] and follow the prompts.  For any non-urgent questions, you may also contact your provider using MyChart. We now offer e-Visits for anyone 18 and older to request care online for non-urgent symptoms. For details visit [email or web address].   Also download the MyChart app! Go to the app store, search "MyChart", open the app, select [facility], and log in with your MyChart username and password.  Masks are optional in the cancer centers. If you would like for your care team to wear a mask while they are taking care of you, please let them know. You may have one support person who is at least 76 years old accompany you for your appointments.  

## 2021-11-17 NOTE — Progress Notes (Signed)
Patients port accessed from Home health care for home IV antibiotics.  Needle flushed easily with labs drawn for oncology follow up visit.  Needle changed out due to Home Health cares needle being the 7th day.    Patients port flushed without difficulty.  Good blood return noted with no bruising or swelling noted at site.  Band aid applied.  VSS with discharge and left in satisfactory condition with no s/s of distress noted.

## 2021-11-17 NOTE — Progress Notes (Signed)
[place], [place] [address]   CLINIC:  Medical Oncology/Hematology  PCP:  [name], MD [address] / [place] [address] [phone]   REASON FOR VISIT:  Follow-up for MPL positive myeloproliferative neoplasm  PRIOR THERAPY: none  NGS Results: not done  CURRENT THERAPY: Jakafi 10 mg twice daily  INTERVAL HISTORY:  [name], a 76 y.o. female, seen for follow-up of MPL positive myeloproliferative neoplasm.  She is accompanied by her daughter.  She was recently hospitalized from 11/10/2021 through 11/13/2021 with cough and shortness of breath.  Sputum culture showed Pseudomonas and stenotrophomonas.  [name] has been on hold since the hospitalization.  She was discharged home on IV meropenem and Bactrim.  She reports that her energy levels are improving.  She still has some cough with expectoration.  Denies any fevers or chills since discharge.  REVIEW OF SYSTEMS:  Review of Systems  Respiratory:  Positive for cough and shortness of breath.   Musculoskeletal:  Positive for arthralgias (Hip, back and legs stable).  All other systems reviewed and are negative.   PAST MEDICAL/SURGICAL HISTORY:  Past Medical History:  Diagnosis Date   Acute cholangitis    Allergy    Anemia    Anxiety    Arthritis    Phreesia 03/18/2020   Barrett's esophagus    Cataract    Chronic back pain    Chronic neck pain    CKD (chronic kidney disease) stage 3, GFR 30-59 ml/min ([place]) 10/29/2020   Depression    Genital herpes    GERD (gastroesophageal reflux disease)    H/O degenerative disc disease    History of blood transfusion    Hypertension    Insomnia    Lupus (systemic lupus erythematosus) ([place])    Neuromuscular disorder ([place])    Osteoarthritis    S/P colonoscopy June 2005   normal, no polyps   S/P endoscopy June 2005, Oct 2009   2005: short-segment Barrett's, 2009: short-segment Barrett's   Upper respiratory  tract infection 07/09/2020   UTI (lower urinary tract infection) 11/2012   Past Surgical History:  Procedure Laterality Date   ABDOMINAL HYSTERECTOMY     BACK SURGERY     BIOPSY N/A 03/20/2014   Procedure: BIOPSY;  Surgeon: Daneil Dolin, MD;  Location: [facility];  Service: Endoscopy;  Laterality: N/A;   BIOPSY  09/14/2015   Procedure: BIOPSY;  Surgeon: Daneil Dolin, MD;  Location: [facility];  Service: Endoscopy;;  esophageal and gastric   BIOPSY  07/23/2019   Procedure: BIOPSY;  Surgeon: Daneil Dolin, MD;  Location[facility];  Service: Endoscopy;;  esophageal    CARPAL TUNNEL RELEASE Left 2013   cervical disectomy  2002   CESAREAN SECTION N/A    Phreesia 03/18/2020   CHOLECYSTECTOMY     with lysis of adhesions for sbo; "ruptured gallbladder".   COLONOSCOPY  11/09/2011   RMR: Melanosis coli   COLONOSCOPY WITH PROPOFOL N/A 09/14/2015   Dr. Gala Romney: diverticulosis, 17m TA removed. next TCS 09/2020.    COLONOSCOPY WITH PROPOFOL N/A 04/30/2020   Procedure: COLONOSCOPY WITH PROPOFOL;  Surgeon: RDaneil Dolin MD;  [facility];  Service: Endoscopy;  Laterality: N/A;  PM (ASA 3)   DENTAL SURGERY  11/2015   multiple tooth extraction   ESOPHAGOGASTRODUODENOSCOPY  11/29/2007   salmon-colored  tongue   longest stable at  3 cm, distal esophagus as described previously status  post biopsy/ Hiatal hernia, otherwise normal stomach D1 and D2   ESOPHAGOGASTRODUODENOSCOPY  01/06/11   short segment Barrett's esophagus s/p bx/Hiatal hernia   ESOPHAGOGASTRODUODENOSCOPY (EGD) WITH PROPOFOL N/A 03/20/2014   ZYY:QMGNOIBB distal esophagus short segment barrett's, bx with no dysplasia. next egd in 03/2017   ESOPHAGOGASTRODUODENOSCOPY (EGD) WITH PROPOFOL N/A 09/14/2015   Dr. Gala Romney: Barrett's without dysplasia, gastritis benign bx, hiatal hernia. next EGD 09/2018.   ESOPHAGOGASTRODUODENOSCOPY (EGD) WITH PROPOFOL N/A 07/23/2019   Procedure: ESOPHAGOGASTRODUODENOSCOPY (EGD) WITH PROPOFOL;  Surgeon: Daneil Dolin, MD;  [facility];  Service: Endoscopy;  Laterality: N/A;  3:00pm   EYE SURGERY N/A    Phreesia 03/18/2020   HERNIA REPAIR Right 07/2010   Dr. Zada Girt   IR IMAGING GUIDED PORT INSERTION  02/09/2021   JOINT REPLACEMENT     LAPAROSCOPIC CHOLECYSTECTOMY  2017   at [facility]   POLYPECTOMY  09/14/2015   Procedure: POLYPECTOMY;  Surgeon: Daneil Dolin, MD;  [facility];  Service: Endoscopy;;  ascending colon   right hip replacement  07/2010   went back in sept 2012 to fix   SHOULDER ARTHROSCOPY  2008   left   SPINE SURGERY N/A    Phreesia 03/18/2020   TOTAL HIP REVISION Right 12/17/2012   Procedure: RIGHT TOTAL HIP REVISION;  Surgeon: Mauri Pole, MD;  Location: [facility];  Service: Orthopedics;  Laterality: Right;   WRIST SURGERY Right 2011   open reduction right wrist.    SOCIAL HISTORY:  Social History   Socioeconomic History   Marital status: Married    Spouse name: louis   Number of children: 4   Years of education: 12+   Highest education level: Some college, no degree  Occupational History   Occupation: Press photographer  - retired   Occupation: non profit  Tobacco Use   Smoking status: Former    Packs/day: 0.25    Years: 25.00    Total pack years: 6.25    Types: Cigarettes    Quit date: 02/07/2003    Years since quitting: 18.7   Smokeless tobacco: Never   Tobacco comments:    quit in 2004  Vaping Use   Vaping Use: Never used  Substance and Sexual Activity   Alcohol use: No   Drug use: No   Sexual activity: Not Currently    Birth control/protection: Surgical  Other Topics Concern   Not on file  Social History Narrative   Not on file   Social Determinants of Health   Financial Resource Strain: Low Risk  (12/28/2020)   Overall Financial Resource Strain (CARDIA)    Difficulty of Paying Living Expenses: Not hard at all  Food Insecurity: No Food Insecurity (11/11/2021)   Hunger Vital Sign    Worried About Running Out of Food in the Last  Year: Never true    [place] in the Last Year: Never true  Transportation Needs: No Transportation Needs (11/11/2021)   PRAPARE - Hydrologist (Medical): No    Lack of Transportation (Non-Medical): No  Physical Activity: Inactive (04/23/2021)   Exercise Vital Sign    Days of Exercise per Week: 0 days    Minutes of Exercise per Session: 0 min  Stress: Stress Concern Present (04/23/2021)   [place]    Feeling of Stress : Rather much  Social Connections: Moderately Integrated (12/28/2020)   Social Connection and Isolation Panel [NHANES]  Frequency of Communication with Friends and Family: More than three times a week    Frequency of Social Gatherings with Friends and Family: Once a week    Attends Religious Services: More than 4 times per year    Active Member of [employer] or Organizations: No    Attends Archivist Meetings: Never    Marital Status: Married  Human resources officer Violence: Not At Risk (11/11/2021)   Humiliation, Afraid, Rape, and Kick questionnaire    Fear of Current or Ex-Partner: No    Emotionally Abused: No    Physically Abused: No    Sexually Abused: No    FAMILY HISTORY:  Family History  Problem Relation Age of Onset   Hypertension Mother    Stroke Mother    Colon cancer Neg Hx    Anesthesia problems Neg Hx    Hypotension Neg Hx    Malignant hyperthermia Neg Hx    Pseudochol deficiency Neg Hx    Gastric cancer Neg Hx    Esophageal cancer Neg Hx     CURRENT MEDICATIONS:  Current Outpatient Medications  Medication Sig Dispense Refill   acyclovir (ZOVIRAX) 800 MG tablet Take 1 tablet (800 mg total) by mouth 5 (five) times daily. 50 tablet 1   acyclovir ointment (ZOVIRAX) 5 % Apply 1 application. topically every 3 (three) hours. 15 g 1   albuterol (PROVENTIL) (2.5 MG/3ML) 0.083% nebulizer solution Take 3 mLs (2.5 mg total) by nebulization every 4 (four)  hours as needed for wheezing or shortness of breath. 75 mL 12   albuterol (VENTOLIN HFA) 108 (90 Base) MCG/ACT inhaler Inhale 2 puffs into the lungs every 4 (four) hours as needed for wheezing or shortness of breath. 8 g 11   allopurinol (ZYLOPRIM) 300 MG tablet Take 1 tablet (300 mg total) by mouth daily. 30 tablet 3   budesonide-formoterol (SYMBICORT) 80-4.5 MCG/ACT inhaler Take 2 puffs first thing in am and then another 2 puffs about 12 hours later. 10.2 g 11   citalopram (CELEXA) 20 MG tablet Take 1 tablet by mouth once daily 30 tablet 3   EPINEPHRINE 0.3 mg/0.3 mL IJ SOAJ injection INJECT 0.3 MLS INTO MUSCLE ONCE AS NEEDED (Patient taking differently: Inject 0.3 mg into the muscle as needed for anaphylaxis.) 1 Device 2   furosemide (LASIX) 20 MG tablet Take 1 tablet (20 mg total) by mouth daily as needed. 30 tablet 2   gabapentin (NEURONTIN) 300 MG capsule Take one capsule by mouth once daily, as needed, for pain (Patient taking differently: Take 300 mg by mouth daily as needed (Pain).) 90 capsule 1   lidocaine-prilocaine (EMLA) cream Apply 1 Application topically as needed (Access to port and pain). 30 g 6   meropenem (MERREM) IVPB Inject 1 g into the vein every 12 (twelve) hours for 11 days. Indication:  Pseudomonas pneumonia First Dose: Yes Last Day of Therapy:  11/24/2021 Labs - Once weekly:  CBC/D and BMP, Labs - Every other week:  ESR and CRP Method of administration: Mini-Bag Plus / Gravity Method of administration may be changed at the discretion of home infusion pharmacist based upon assessment of the patient and/or caregiver's ability to self-administer the medication ordered. 24 Units 0   metoprolol tartrate (LOPRESSOR) 25 MG tablet Take 1 tablet (25 mg total) by mouth 2 (two) times daily. 90 tablet 1   mirtazapine (REMERON) 15 MG tablet Take 1 tablet (15 mg total) by mouth at bedtime. 30 tablet 5   Multiple Vitamin (MULTIVITAMIN WITH MINERALS)  TABS tablet Take 1 tablet by mouth  every other day.     oxyCODONE (OXY IR/ROXICODONE) 5 MG immediate release tablet Take 5 mg by mouth every 4 (four) hours as needed for moderate pain, severe pain or breakthrough pain.     pantoprazole (PROTONIX) 40 MG tablet Take 1 tablet (40 mg total) by mouth 2 (two) times daily before a meal. 60 tablet 0   potassium chloride (KLOR-CON) 10 MEQ tablet Take 1 tablet by mouth once daily 30 tablet 2   ruxolitinib phosphate (JAKAFI) 15 MG tablet Take 1 tablet (15 mg total) by mouth 2 (two) times daily. 60 tablet 3   sulfamethoxazole-trimethoprim (BACTRIM DS) 800-160 MG tablet Take 1 tablet by mouth every 12 (twelve) hours for 11 days. 22 tablet 0   temazepam (RESTORIL) 30 MG capsule Take 1 capsule (30 mg total) by mouth at bedtime as needed for sleep. 30 capsule 5   tiZANidine (ZANAFLEX) 4 MG tablet Take 2 tablets (8 mg total) by mouth 3 (three) times daily as needed (spasms). (Patient taking differently: Take 4-8 mg by mouth 3 (three) times daily as needed for muscle spasms.) 180 tablet 0   No current facility-administered medications for this visit.   Facility-Administered Medications Ordered in Other Visits  Medication Dose Route Frequency Provider Last Rate Last Admin   lanreotide acetate (SOMATULINE DEPOT) 120 MG/0.5ML injection            octreotide (SANDOSTATIN LAR) 30 MG IM injection             ALLERGIES:  Allergies  Allergen Reactions   Bee Venom Swelling and Hives   Norvasc [Amlodipine] Other (See Comments)    Hair loss   Tylenol [Acetaminophen] Itching   Red Dye Itching   Percocet [Oxycodone-Acetaminophen] Rash and Other (See Comments)    Pt states, "this gives her a rash, but at home she takes oxycodone for pain relief"    Tyloxapol Itching and Rash    PHYSICAL EXAM:  Performance status (ECOG): 1 - Symptomatic but completely ambulatory  There were no vitals filed for this visit.  Wt Readings from Last 3 Encounters:  11/17/21 132 lb 4.4 oz (60 kg)  11/16/21 133 lb 12.8  oz (60.7 kg)  11/11/21 126 lb 8.7 oz (57.4 kg)   Physical Exam Vitals reviewed.  Constitutional:      Appearance: Normal appearance.  Cardiovascular:     Rate and Rhythm: Normal rate and regular rhythm.     Pulses: Normal pulses.     Heart sounds: Normal heart sounds.  Pulmonary:     Effort: Pulmonary effort is normal.     Breath sounds: Normal breath sounds.  Abdominal:     Palpations: Abdomen is soft. There is hepatomegaly (palpable 3 fingerbreadths below costal margin) and splenomegaly (2 fingerbreadths below costal margin'). There is no mass.     Tenderness: There is no abdominal tenderness.  Musculoskeletal:     Right lower leg: 1+ Edema present.     Left lower leg: 1+ Edema present.  Neurological:     General: No focal deficit present.     Mental Status: She is alert and oriented to person, place, and time.  Psychiatric:        Mood and Affect: Mood normal.        Behavior: Behavior normal.      LABORATORY DATA:  I have reviewed the labs as listed.     Latest Ref Rng & Units 11/17/2021   11:17 AM 11/13/2021  5:00 AM 11/12/2021    5:02 AM  CBC  WBC 4.0 - 10.5 K/uL 74.0  105.6  116.1   Hemoglobin 12.0 - 15.0 g/dL 7.9  8.5  7.5   Hematocrit 36.0 - 46.0 % 24.3  26.7  23.3   Platelets 150 - 400 K/uL 45  57  70       Latest Ref Rng & Units 11/17/2021   11:17 AM 11/13/2021   12:59 PM 11/13/2021    5:00 AM  CMP  Glucose 70 - 99 mg/dL 105  99  95   BUN 8 - 23 mg/dL 27  12  12    Creatinine 0.44 - 1.00 mg/dL 1.97  1.42  1.57   Sodium 135 - 145 mmol/L 138  139  140   Potassium 3.5 - 5.1 mmol/L 4.2  3.9  4.1   Chloride 98 - 111 mmol/L 111  112  111   CO2 22 - 32 mmol/L 21  21  21    Calcium 8.9 - 10.3 mg/dL 8.2  8.1  8.5   Total Protein 6.5 - 8.1 g/dL 7.0     Total Bilirubin 0.3 - 1.2 mg/dL 0.5     Alkaline Phos 38 - 126 U/L 200     AST 15 - 41 U/L 49     ALT 0 - 44 U/L 41       DIAGNOSTIC IMAGING:  I have independently reviewed the scans and discussed with the  patient. CT Chest High Resolution  Result Date: 11/10/2021 CLINICAL DATA:  Cough, chronic/persisting > 8 weeks, history of myeloproliferative disorder EXAM: CT CHEST WITHOUT CONTRAST TECHNIQUE: Multidetector CT imaging of the chest was performed following the standard protocol without intravenous contrast. High resolution imaging of the lungs, as well as inspiratory and expiratory imaging, was performed. RADIATION DOSE REDUCTION: This exam was performed according to the departmental dose-optimization program which includes automated exposure control, adjustment of the mA and/or kV according to patient size and/or use of iterative reconstruction technique. COMPARISON:  11/10/2021, 09/01/2021 FINDINGS: Cardiovascular: Unenhanced imaging of the heart demonstrates borderline cardiomegaly without pericardial effusion. Normal caliber of the thoracic aorta. There is atherosclerosis of the aorta and coronary vasculature unchanged. Right chest wall port via internal jugular approach tip within the atriocaval junction. Evaluation of the vascular lumen is limited without IV contrast. Mediastinum/Nodes: No enlarged mediastinal, hilar, or axillary lymph nodes. Thyroid gland, trachea, and esophagus demonstrate no significant findings. Lungs/Pleura: Stable upper lobe predominant emphysema. Subpleural linear densities within the dependent lower lobes likely hypoventilatory in nature. No evidence of fibrosis. There is bilateral cylindrical bronchiectasis, greatest in the lower lobes. No bronchial wall thickening. The central airways are patent. No air trapping on expiration. Decreased caliber of the tracheobronchial tree during expiration may reflect an element of tracheobronchomalacia. Upper Abdomen: Stable splenomegaly. Otherwise no acute upper abdominal process. Musculoskeletal: There are no acute or destructive bony lesions. Reconstructed images demonstrate no additional findings. IMPRESSION: 1. Bilateral cylindrical  bronchiectasis. No evidence of bronchial wall thickening. 2. Upper lobe predominant emphysema. 3. Subpleural hypoventilatory changes at the lung bases. No evidence of underlying fibrosis. 4. Decreased caliber of the tracheobronchial tree during expiration, which could reflect an element of tracheal bronchomalacia. 5. Aortic Atherosclerosis (ICD10-I70.0) and Emphysema (ICD10-J43.9). 6. Splenomegaly, compatible with known history of myeloproliferative disorder. Electronically Signed   By: Randa Ngo M.D.   On: 11/10/2021 21:55   DG Chest 2 View  Result Date: 11/10/2021 CLINICAL DATA:  Shortness of breath and chest pains for 3  months. EXAM: CHEST - 2 VIEW COMPARISON:  Chest two views 10/19/2021, [facility] chest 09/01/2021 FINDINGS: Redemonstration of right chest wall porta catheter with tip overlying the med severe vena cava. Cardiac silhouette is at the upper limits of normal size. Mediastinal contours are within normal limits. Mild-to-moderate calcification within the aortic arch. The lungs are clear. No pleural effusion or pneumothorax. Mild multilevel degenerative disc changes of the thoracic spine. IMPRESSION: No active cardiopulmonary disease. Electronically Signed   By: Yvonne Kendall M.D.   On: 11/10/2021 14:58   ECHOCARDIOGRAM COMPLETE  Result Date: 10/20/2021    ECHOCARDIOGRAM REPORT   Patient Name:   ADELAI ACHEY Date of Exam: 10/20/2021 Medical Rec #:  [id number]      Height:       65.0 in Accession #:    [id number]     Weight:       131.0 lb Date of Birth:  02/02/1946     BSA:          1.652 m Patient Age:    73 years       BP:           133/71 mmHg Patient Gender: F              HR:           67 bpm. Exam Location:  [name] Procedure: 2D Echo, Cardiac Doppler and Color Doppler Indications:    R01.1 Murmur  History:        Patient has no prior history of Echocardiogram examinations.                 Risk Factors:Hypertension. Lupus (systemic lupus erythematosus)                 (HCC) (From Hx).   Sonographer:    Alvino Chapel RCS Referring Phys: [place]  1. Left ventricular ejection fraction, by estimation, is 55 to 60%. The left ventricle has normal function. The left ventricle has no regional wall motion abnormalities. Left ventricular diastolic parameters are consistent with Grade II diastolic dysfunction (pseudonormalization).  2. Right ventricular systolic function is normal. The right ventricular size is normal. Tricuspid regurgitation signal is inadequate for assessing PA pressure.  3. Left atrial size was moderately dilated.  4. Right atrial size was upper normal.  5. The mitral valve is grossly normal. Mild mitral valve regurgitation.  6. The aortic valve is tricuspid. Aortic valve regurgitation is mild to moderate. Aortic valve sclerosis/calcification is present, without any evidence of aortic stenosis. Aortic regurgitation PHT measures 447 msec. Aortic valve mean gradient measures 12.0 mmHg.  7. The inferior vena cava is normal in size with greater than 50% respiratory variability, suggesting right atrial pressure of 3 mmHg. Comparison(s): No prior Echocardiogram. FINDINGS  Left Ventricle: Left ventricular ejection fraction, by estimation, is 55 to 60%. The left ventricle has normal function. The left ventricle has no regional wall motion abnormalities. The left ventricular internal cavity size was normal in size. There is  borderline left ventricular hypertrophy. Left ventricular diastolic parameters are consistent with Grade II diastolic dysfunction (pseudonormalization). Right Ventricle: The right ventricular size is normal. No increase in right ventricular wall thickness. Right ventricular systolic function is normal. Tricuspid regurgitation signal is inadequate for assessing PA pressure. Left Atrium: Left atrial size was moderately dilated. Right Atrium: Right atrial size was upper normal. Pericardium: There is no evidence of pericardial effusion. Mitral Valve: The  mitral valve is grossly normal. Mild mitral  valve regurgitation. Tricuspid Valve: The tricuspid valve is grossly normal. Tricuspid valve regurgitation is trivial. Aortic Valve: The aortic valve is tricuspid. There is mild aortic valve annular calcification. Aortic valve regurgitation is mild to moderate. Aortic regurgitation PHT measures 447 msec. Aortic valve sclerosis/calcification is present, without any evidence of aortic stenosis. Aortic valve mean gradient measures 12.0 mmHg. Aortic valve peak gradient measures 25.6 mmHg. Aortic valve area, by VTI measures 1.63 cm. Pulmonic Valve: The pulmonic valve was grossly normal. Pulmonic valve regurgitation is trivial. Aorta: The aortic root is normal in size and structure. Venous: The inferior vena cava is normal in size with greater than 50% respiratory variability, suggesting right atrial pressure of 3 mmHg. IAS/Shunts: No atrial level shunt detected by color flow Doppler.  LEFT VENTRICLE PLAX 2D LVIDd:         4.40 cm   Diastology LVIDs:         3.00 cm   LV e' medial:    6.64 cm/s LV PW:         1.00 cm   LV E/e' medial:  17.5 LV IVS:        1.00 cm   LV e' lateral:   6.74 cm/s LVOT diam:     1.80 cm   LV E/e' lateral: 17.2 LV SV:         82 LV SV Index:   50 LVOT Area:     2.54 cm  RIGHT VENTRICLE RV S prime:     17.40 cm/s TAPSE (M-mode): 3.0 cm LEFT ATRIUM             Index        RIGHT ATRIUM           Index LA diam:        3.00 cm 1.82 cm/m   RA Area:     17.50 cm LA Vol (A2C):   79.9 ml 48.36 ml/m  RA Volume:   49.50 ml  29.96 ml/m LA Vol (A4C):   83.3 ml 50.41 ml/m LA Biplane Vol: 82.5 ml 49.93 ml/m  AORTIC VALVE AV Area (Vmax):    1.61 cm AV Area (Vmean):   1.69 cm AV Area (VTI):     1.63 cm AV Vmax:           253.00 cm/s AV Vmean:          152.000 cm/s AV VTI:            0.505 m AV Peak Grad:      25.6 mmHg AV Mean Grad:      12.0 mmHg LVOT Vmax:         160.50 cm/s LVOT Vmean:        101.050 cm/s LVOT VTI:          0.323 m LVOT/AV VTI ratio:  0.64 AI PHT:            447 msec  AORTA Ao Root diam: 3.10 cm MITRAL VALVE MV Area (PHT): 2.29 cm     SHUNTS MV Decel Time: 331 msec     Systemic VTI:  0.32 m MV E velocity: 116.00 cm/s  Systemic Diam: 1.80 cm MV A velocity: 115.00 cm/s MV E/A ratio:  1.01 Rozann Lesches MD Electronically signed by Rozann Lesches MD Signature Date/Time: 10/20/2021/6:01:40 PM    Final    DG Chest 2 View  Result Date: 10/19/2021 CLINICAL DATA:  Cough, anemia, low blood pressure EXAM: CHEST - 2 VIEW COMPARISON:  09/02/2021 FINDINGS:  RIGHT jugular Port-A-Cath with tip projecting over SVC. Enlargement of cardiac silhouette with pulmonary vascular congestion. Mediastinal contours normal. Minimal chronic interstitial prominence stable. No pulmonary infiltrate, pleural effusion, or pneumothorax. No osseous abnormalities. IMPRESSION: Enlargement of cardiac silhouette with pulmonary vascular congestion. Minimal chronic interstitial prominence without acute infiltrate. Electronically Signed   By: Lavonia Dana M.D.   On: 10/19/2021 16:41     ASSESSMENT:  Leukocytosis with left shift: - CBC on 11/06/2020 with white count 75.6, differential 59% neutrophils, 12% monocytes, 4% lymphocytes, 1 percentage of eosinophils and basophils, 6% band neutrophils, 12% myelocytes, 1% promyelocytes, 29% blasts. - Pathologist review of blood smear reported as leukoerythroblastic reaction. - 25 pound weight loss in the last 6 months, unintentional.  Decreased appetite.  Reports fatigue for the last few months.  Reports night sweats x3 in the last 1 month. - Bone marrow biopsy on 11/18/2020 consistent with granulocytic proliferation with differential including CMML versus myeloproliferative disorder. - BCR/ABL was negative. 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 - NGS myeloid panel shows mutations in ASXL1, MPL, TET2, EZH2 mutations. - Spleen ultrasound on 12/16/2020 shows mildly enlarged measuring 12.4 x 9.6 x 7 cm with volume  of 435 cc. - PDGFR alpha, beta and FGFR 1 was negative. - She was evaluated by Dr. Florene Glen at [facility].  Slides were reviewed at [facility] hematopathology.  They thought it was less likely CMML and more likely MPL positive myeloproliferative neoplasm.  Dr. Florene Glen has recommended initiate Jakafi. - Ruxolitinib 10 mg twice daily started on 02/16/2021.  Dose increased to 15 mg twice daily on 05/18/2021. - CTAP on 04/07/2021: Hepatomegaly and splenomegaly measuring 14.3 cm.  No other pathology.  No spleen infarcts. -Ruxolitinib dose increased to 15 mg twice daily on 09/20/2021. - Ultrasound spleen (09/21/2021): 13.6 cm with volume 532 mL.  This is slightly improved from previous volume. - Bone marrow biopsy (09/09/2021): Material is limited but overall features consistent with myeloid neoplasm.  No increase in blasts.  Erythroid precursors appear decreased but megakaryocytes are variably evident with clusters and small forms.  Reticulin's shows variable increase in reticulin fibers including areas with prominent increase.  Chromosome analysis was normal.  No significant changes at this time.  I have recommended continuing Jakafi 15 mg twice daily.  She reports that she is seeking a second opinion at [facility].  2.  Social/family history: - She lives at home and is able to do all her ADLs and IADLs although she is getting tired lately.  She reports quitting smoking 6 months ago and smoked 1 pack/week for 43 years. - She believes that her mother had some kind of leukemia.  No other malignancies.   PLAN:  MPL positive myeloproliferative neoplasm: - Hospitalized from 11/10/2021 through 11/13/2021 with cough and shortness of breath, sputum cultures grew Pseudomonas and stenotrophomonas.  I have reviewed hospitalization records. - Continue IV meropenem and oral Bactrim for another 7 days. [name] has been on hold since 11/11/2021. - Reviewed labs today which shows white count is 74 and hemoglobin 7.9.   Platelet count is 45.  Platelet count has been low since ruxolitinib dose increased.  However it has come down below 100 starting 11/10/2021.  We will closely monitor. - We will continue to check CBC weekly to see if she needs any transfusion.  Today she does not require any transfusion.  I will see her back in 3 weeks for follow-up.  2.  Decreased appetite: - Her weight  has been stable since the discharge.  3.  CKD: - Baseline creatinine between 1.3-1.5.  Today creatinine is 1.97.  Likely from antibiotics.  She will receive 1 L of normal saline.  4.  Hypokalemia: - Continue potassium supplements.  Potassium today is 4.2.  5.  Generalized itching: - She reports continued itching.  She has been off of ruxolitinib.  Hence I do not think ruxolitinib is contributing to the itching.    Orders placed this encounter:  No orders of the defined types were placed in this encounter.     Derek Jack, MD [place] [phone]

## 2021-11-17 NOTE — Telephone Encounter (Signed)
Please order flutter valve - use as often as needed to help cough up mucus

## 2021-11-18 ENCOUNTER — Inpatient Hospital Stay: Payer: Medicare Other

## 2021-11-18 VITALS — BP 110/63 | HR 69 | Temp 97.1°F | Resp 18

## 2021-11-18 DIAGNOSIS — M545 Low back pain, unspecified: Secondary | ICD-10-CM

## 2021-11-18 DIAGNOSIS — Z452 Encounter for adjustment and management of vascular access device: Secondary | ICD-10-CM

## 2021-11-18 DIAGNOSIS — F32A Depression, unspecified: Secondary | ICD-10-CM | POA: Diagnosis not present

## 2021-11-18 DIAGNOSIS — F419 Anxiety disorder, unspecified: Secondary | ICD-10-CM | POA: Diagnosis not present

## 2021-11-18 DIAGNOSIS — D471 Chronic myeloproliferative disease: Secondary | ICD-10-CM | POA: Diagnosis not present

## 2021-11-18 DIAGNOSIS — N1832 Chronic kidney disease, stage 3b: Secondary | ICD-10-CM | POA: Diagnosis not present

## 2021-11-18 DIAGNOSIS — D631 Anemia in chronic kidney disease: Secondary | ICD-10-CM | POA: Diagnosis not present

## 2021-11-18 DIAGNOSIS — I129 Hypertensive chronic kidney disease with stage 1 through stage 4 chronic kidney disease, or unspecified chronic kidney disease: Secondary | ICD-10-CM | POA: Diagnosis not present

## 2021-11-18 DIAGNOSIS — N183 Chronic kidney disease, stage 3 unspecified: Secondary | ICD-10-CM | POA: Diagnosis not present

## 2021-11-18 DIAGNOSIS — E876 Hypokalemia: Secondary | ICD-10-CM | POA: Diagnosis not present

## 2021-11-18 DIAGNOSIS — J9809 Other diseases of bronchus, not elsewhere classified: Secondary | ICD-10-CM | POA: Diagnosis not present

## 2021-11-18 DIAGNOSIS — K219 Gastro-esophageal reflux disease without esophagitis: Secondary | ICD-10-CM

## 2021-11-18 DIAGNOSIS — J151 Pneumonia due to Pseudomonas: Secondary | ICD-10-CM | POA: Diagnosis not present

## 2021-11-18 DIAGNOSIS — J439 Emphysema, unspecified: Secondary | ICD-10-CM | POA: Diagnosis not present

## 2021-11-18 DIAGNOSIS — K2270 Barrett's esophagus without dysplasia: Secondary | ICD-10-CM

## 2021-11-18 DIAGNOSIS — Z7951 Long term (current) use of inhaled steroids: Secondary | ICD-10-CM

## 2021-11-18 DIAGNOSIS — D849 Immunodeficiency, unspecified: Secondary | ICD-10-CM

## 2021-11-18 DIAGNOSIS — Z87891 Personal history of nicotine dependence: Secondary | ICD-10-CM

## 2021-11-18 DIAGNOSIS — Z792 Long term (current) use of antibiotics: Secondary | ICD-10-CM

## 2021-11-18 DIAGNOSIS — M3214 Glomerular disease in systemic lupus erythematosus: Secondary | ICD-10-CM | POA: Diagnosis not present

## 2021-11-18 DIAGNOSIS — J479 Bronchiectasis, uncomplicated: Secondary | ICD-10-CM | POA: Diagnosis not present

## 2021-11-18 DIAGNOSIS — M1990 Unspecified osteoarthritis, unspecified site: Secondary | ICD-10-CM

## 2021-11-18 DIAGNOSIS — I10 Essential (primary) hypertension: Secondary | ICD-10-CM | POA: Diagnosis not present

## 2021-11-18 DIAGNOSIS — Z5111 Encounter for antineoplastic chemotherapy: Secondary | ICD-10-CM

## 2021-11-18 DIAGNOSIS — Z95828 Presence of other vascular implants and grafts: Secondary | ICD-10-CM

## 2021-11-18 DIAGNOSIS — N179 Acute kidney failure, unspecified: Secondary | ICD-10-CM | POA: Diagnosis not present

## 2021-11-18 DIAGNOSIS — M542 Cervicalgia: Secondary | ICD-10-CM

## 2021-11-18 DIAGNOSIS — C946 Myelodysplastic disease, not classified: Secondary | ICD-10-CM | POA: Diagnosis not present

## 2021-11-18 DIAGNOSIS — G8929 Other chronic pain: Secondary | ICD-10-CM

## 2021-11-18 DIAGNOSIS — F5105 Insomnia due to other mental disorder: Secondary | ICD-10-CM

## 2021-11-18 DIAGNOSIS — D696 Thrombocytopenia, unspecified: Secondary | ICD-10-CM

## 2021-11-18 DIAGNOSIS — Z79891 Long term (current) use of opiate analgesic: Secondary | ICD-10-CM

## 2021-11-18 DIAGNOSIS — Z9181 History of falling: Secondary | ICD-10-CM

## 2021-11-18 MED ORDER — SODIUM CHLORIDE 0.9 % IV SOLN: INTRAVENOUS | Status: DC

## 2021-11-18 MED ORDER — HEPARIN SOD (PORK) LOCK FLUSH 100 UNIT/ML IV SOLN
500.0000 [IU] | Freq: Once | INTRAVENOUS | Status: AC
  Administered 2021-11-18: 500 [IU] via INTRAVENOUS

## 2021-11-18 MED ORDER — SODIUM CHLORIDE 0.9% FLUSH
10.0000 mL | INTRAVENOUS | Status: DC | PRN
  Administered 2021-11-18: 10 mL via INTRAVENOUS

## 2021-11-18 NOTE — Patient Instructions (Signed)
[facility]  Discharge Instructions: Thank you for choosing [facility] to provide your oncology and hematology care.  If you have a lab appointment with the [facility], please come in thru the Main Entrance and check in at the main information desk.  Wear comfortable clothing and clothing appropriate for easy access to any Portacath or PICC line.   We strive to give you quality time with your provider. You may need to reschedule your appointment if you arrive late (15 or more minutes).  Arriving late affects you and other patients whose appointments are after yours.  Also, if you miss three or more appointments without notifying the office, you may be dismissed from the clinic at the provider's discretion.      For prescription refill requests, have your pharmacy contact our office and allow 72 hours for refills to be completed.    Today you received 1 Liter of NS     BELOW ARE SYMPTOMS THAT SHOULD BE REPORTED IMMEDIATELY: *FEVER GREATER THAN 100.4 F (38 C) OR HIGHER *CHILLS OR SWEATING *NAUSEA AND VOMITING THAT IS NOT CONTROLLED WITH YOUR NAUSEA MEDICATION *UNUSUAL SHORTNESS OF BREATH *UNUSUAL BRUISING OR BLEEDING *URINARY PROBLEMS (pain or burning when urinating, or frequent urination) *BOWEL PROBLEMS (unusual diarrhea, constipation, pain near the anus) TENDERNESS IN MOUTH AND THROAT WITH OR WITHOUT PRESENCE OF ULCERS (sore throat, sores in mouth, or a toothache) UNUSUAL RASH, SWELLING OR PAIN  UNUSUAL VAGINAL DISCHARGE OR ITCHING   Items with * indicate a potential emergency and should be followed up as soon as possible or go to the Emergency Department if any problems should occur.  Please show the CHEMOTHERAPY ALERT CARD or IMMUNOTHERAPY ALERT CARD at check-in to the Emergency Department and triage nurse.  Should you have questions after your visit or need to cancel or reschedule your appointment, please contact [facility] [phone]  and follow the prompts.  Office hours are 8:00 a.m. to 4:30 p.m. Monday - Friday. Please note that voicemails left after 4:00 p.m. may not be returned until the following business day.  We are closed weekends and major holidays. You have access to a nurse at all times for urgent questions. Please call the main number to the clinic [phone] and follow the prompts.  For any non-urgent questions, you may also contact your provider using MyChart. We now offer e-Visits for anyone 5 and older to request care online for non-urgent symptoms. For details visit [email or web address].   Also download the MyChart app! Go to the app store, search "MyChart", open the app, select [facility], and log in with your MyChart username and password.  Masks are optional in the cancer centers. If you would like for your care team to wear a mask while they are taking care of you, please let them know. You may have one support person who is at least 76 years old accompany you for your appointments.

## 2021-11-18 NOTE — Progress Notes (Signed)
Pt presents today for 1 liter of normal saline over 2 hours per provider's order. Vital signs stable, and pt voiced no new complaints at this time.  1 Liter of normal saline given today per MD orders. Tolerated infusion without adverse affects. Vital signs stable. No complaints at this time. Discharged from clinic via wheelchair in stable condition. Alert and oriented x 3. F/U with [facility] as scheduled.

## 2021-11-23 DIAGNOSIS — J151 Pneumonia due to Pseudomonas: Secondary | ICD-10-CM | POA: Diagnosis not present

## 2021-11-23 DIAGNOSIS — M3214 Glomerular disease in systemic lupus erythematosus: Secondary | ICD-10-CM | POA: Diagnosis not present

## 2021-11-23 DIAGNOSIS — J439 Emphysema, unspecified: Secondary | ICD-10-CM | POA: Diagnosis not present

## 2021-11-23 DIAGNOSIS — N1832 Chronic kidney disease, stage 3b: Secondary | ICD-10-CM | POA: Diagnosis not present

## 2021-11-23 DIAGNOSIS — J479 Bronchiectasis, uncomplicated: Secondary | ICD-10-CM | POA: Diagnosis not present

## 2021-11-23 DIAGNOSIS — J9809 Other diseases of bronchus, not elsewhere classified: Secondary | ICD-10-CM | POA: Diagnosis not present

## 2021-11-24 ENCOUNTER — Ambulatory Visit (HOSPITAL_COMMUNITY)
Admission: RE | Admit: 2021-11-24 | Discharge: 2021-11-24 | Disposition: A | Discharge status: Home / Self Care | Payer: Medicare Other | Source: Ambulatory Visit | Attending: Family Medicine | Admitting: Family Medicine

## 2021-11-24 ENCOUNTER — Encounter: Payer: Self-pay | Admitting: Family Medicine

## 2021-11-24 ENCOUNTER — Inpatient Hospital Stay: Payer: Medicare Other

## 2021-11-24 ENCOUNTER — Ambulatory Visit (INDEPENDENT_AMBULATORY_CARE_PROVIDER_SITE_OTHER): Payer: Medicare Other | Admitting: Family Medicine

## 2021-11-24 VITALS — BP 89/48 | HR 67 | Resp 16 | Ht 65.0 in | Wt 131.0 lb

## 2021-11-24 DIAGNOSIS — M79662 Pain in left lower leg: Secondary | ICD-10-CM | POA: Diagnosis not present

## 2021-11-24 DIAGNOSIS — D471 Chronic myeloproliferative disease: Secondary | ICD-10-CM

## 2021-11-24 DIAGNOSIS — M79661 Pain in right lower leg: Secondary | ICD-10-CM | POA: Insufficient documentation

## 2021-11-24 DIAGNOSIS — R2241 Localized swelling, mass and lump, right lower limb: Secondary | ICD-10-CM | POA: Diagnosis not present

## 2021-11-24 DIAGNOSIS — R0989 Other specified symptoms and signs involving the circulatory and respiratory systems: Secondary | ICD-10-CM

## 2021-11-24 DIAGNOSIS — M79669 Pain in unspecified lower leg: Secondary | ICD-10-CM | POA: Diagnosis not present

## 2021-11-24 DIAGNOSIS — J151 Pneumonia due to Pseudomonas: Secondary | ICD-10-CM | POA: Diagnosis not present

## 2021-11-24 DIAGNOSIS — N183 Chronic kidney disease, stage 3 unspecified: Secondary | ICD-10-CM | POA: Diagnosis not present

## 2021-11-24 DIAGNOSIS — R2243 Localized swelling, mass and lump, lower limb, bilateral: Secondary | ICD-10-CM | POA: Diagnosis not present

## 2021-11-24 DIAGNOSIS — M8589 Other specified disorders of bone density and structure, multiple sites: Secondary | ICD-10-CM | POA: Diagnosis not present

## 2021-11-24 DIAGNOSIS — Z7689 Persons encountering health services in other specified circumstances: Secondary | ICD-10-CM

## 2021-11-24 DIAGNOSIS — R2242 Localized swelling, mass and lump, left lower limb: Secondary | ICD-10-CM | POA: Diagnosis not present

## 2021-11-24 DIAGNOSIS — M3214 Glomerular disease in systemic lupus erythematosus: Secondary | ICD-10-CM | POA: Diagnosis not present

## 2021-11-24 DIAGNOSIS — I129 Hypertensive chronic kidney disease with stage 1 through stage 4 chronic kidney disease, or unspecified chronic kidney disease: Secondary | ICD-10-CM | POA: Diagnosis not present

## 2021-11-24 DIAGNOSIS — E876 Hypokalemia: Secondary | ICD-10-CM | POA: Diagnosis not present

## 2021-11-24 DIAGNOSIS — J439 Emphysema, unspecified: Secondary | ICD-10-CM | POA: Diagnosis not present

## 2021-11-24 LAB — CBC WITH DIFFERENTIAL/PLATELET
Band Neutrophils: 12 %
Blasts: 4 %
Eosinophils Relative: 2 %
HCT: 23.9 % — ABNORMAL LOW (ref 36.0–46.0)
Hemoglobin: 7.9 g/dL — ABNORMAL LOW (ref 12.0–15.0)
Lymphocytes Relative: 3 %
MCH: 29.5 pg (ref 26.0–34.0)
MCHC: 33.1 g/dL (ref 30.0–36.0)
MCV: 89.2 fL (ref 80.0–100.0)
Metamyelocytes Relative: 11 %
Monocytes Relative: 5 %
Myelocytes: 8 %
Neutrophils Relative %: 52 %
Platelets: 149 10*3/uL — ABNORMAL LOW (ref 150–400)
Promyelocytes Relative: 4 %
RBC: 2.68 MIL/uL — ABNORMAL LOW (ref 3.87–5.11)
RDW: 24 % — ABNORMAL HIGH (ref 11.5–15.5)
WBC: 84.4 10*3/uL (ref 4.0–10.5)
nRBC: 4 % — ABNORMAL HIGH (ref 0.0–0.2)

## 2021-11-24 LAB — SAMPLE TO BLOOD BANK

## 2021-11-24 MED ORDER — SODIUM CHLORIDE 0.9% FLUSH
10.0000 mL | Freq: Once | INTRAVENOUS | Status: AC
  Administered 2021-11-24: 10 mL via INTRAVENOUS

## 2021-11-24 MED ORDER — HEPARIN SOD (PORK) LOCK FLUSH 100 UNIT/ML IV SOLN
500.0000 [IU] | Freq: Once | INTRAVENOUS | Status: AC
  Administered 2021-11-24: 500 [IU] via INTRAVENOUS

## 2021-11-24 NOTE — Progress Notes (Addendum)
Called patient' cell and left a detailed message pertaining to her HGB of 7.9 and NO blood products needed. Instructed patient to call [phone] if she was symptomatic. Informed Patient we canceled her blood infusion appointment tomorrow at 09:45 am and she can call that number with any questions or concerns.

## 2021-11-24 NOTE — Patient Instructions (Signed)
Cancel October visit and reschedule to first week in December, call if you need me sooner  No re testing sputum for infection unless your cough or sputum worsen  I will try to get a Cardiology appt re blood pressure management  X ray of legs and [place] of painful swellings in legs will be ordered, appointments made and we will contact you with appt information  Thankful that you feel better  Thanks for choosing [facility], we consider it a privelige to serve you.

## 2021-11-24 NOTE — Progress Notes (Signed)
Renee Waters presented for Portacath access and flush. Portacath located R chest wall accessed prior to arrival in [facility]. Good blood return noted. Labs taken from [facility] without difficulty. Portacath flushed with 51m NS and 500U/541mHeparin and needle removed intact.  Procedure tolerated well and without incident. PAC deaccessed and covered with bandaid. Patient discharged ambulatory and in stable condition with family.

## 2021-11-24 NOTE — Progress Notes (Signed)
Late entry:  critical value received at 1430 for WBC 84.4.  [name] aware. No orders were received.  He did see that patients hgb was low and charge RN has already addressed that with patient.  Nothing further needed at this time.

## 2021-11-25 ENCOUNTER — Inpatient Hospital Stay: Payer: Medicare Other | Admitting: Family Medicine

## 2021-11-25 DIAGNOSIS — J9809 Other diseases of bronchus, not elsewhere classified: Secondary | ICD-10-CM | POA: Diagnosis not present

## 2021-11-25 DIAGNOSIS — J479 Bronchiectasis, uncomplicated: Secondary | ICD-10-CM | POA: Diagnosis not present

## 2021-11-25 DIAGNOSIS — J439 Emphysema, unspecified: Secondary | ICD-10-CM | POA: Diagnosis not present

## 2021-11-25 DIAGNOSIS — J151 Pneumonia due to Pseudomonas: Secondary | ICD-10-CM | POA: Diagnosis not present

## 2021-11-25 DIAGNOSIS — N1832 Chronic kidney disease, stage 3b: Secondary | ICD-10-CM | POA: Diagnosis not present

## 2021-11-25 DIAGNOSIS — H35033 Hypertensive retinopathy, bilateral: Secondary | ICD-10-CM | POA: Diagnosis not present

## 2021-11-25 DIAGNOSIS — M3214 Glomerular disease in systemic lupus erythematosus: Secondary | ICD-10-CM | POA: Diagnosis not present

## 2021-11-29 ENCOUNTER — Encounter: Payer: Self-pay | Admitting: Family Medicine

## 2021-11-29 ENCOUNTER — Other Ambulatory Visit: Payer: Self-pay | Admitting: Family Medicine

## 2021-11-29 ENCOUNTER — Other Ambulatory Visit (HOSPITAL_COMMUNITY): Payer: Self-pay | Admitting: Hematology

## 2021-11-29 DIAGNOSIS — Z7689 Persons encountering health services in other specified circumstances: Secondary | ICD-10-CM | POA: Insufficient documentation

## 2021-11-29 DIAGNOSIS — M79669 Pain in unspecified lower leg: Secondary | ICD-10-CM | POA: Insufficient documentation

## 2021-11-29 DIAGNOSIS — R2243 Localized swelling, mass and lump, lower limb, bilateral: Secondary | ICD-10-CM | POA: Insufficient documentation

## 2021-11-29 NOTE — Assessment & Plan Note (Signed)
Clinically improved, completing 14 day antibiotic coursse, f/u with pulmonary as planned, call if deterioration occurs

## 2021-11-29 NOTE — Assessment & Plan Note (Signed)
Pain in both shins, tender to palpation, X rays to be obtained

## 2021-11-29 NOTE — Progress Notes (Signed)
   Renee Waters     MRN: [id number]      DOB: 02/21/1945   HPI Renee Waters is here for follow up and transition o care visit following hospitalization from 10/04 to 11/13/2021, discharged home on oral and IV antibiotics, to complete 14 day treatment for bronchiectasis causing pneumonia. She also refused 1 unit of PRBC during her hospitalization Blood pressure noted to be labile during hospitalization and remains so at this time with low blood pressure noted at this  visit, she is asymptomatic from this and feels much better since discharge, cough significantly less Family has question re need to retest sputum, I recommend against  as symptoms linger longer than infection and she is improved, also the potential adverse effects of prolonged antibiotic  use C/o bilateral shin pain and swellings/ lumps under the skin of her legs that seem to keep " popping up"and do not go away C/o abdominal discomfort yesterday, likely from prolonged antibiotic use, less today  ROS Denies recent fever or chills. Denies sinus pressure, nasal congestion, ear pain or sore throat.  Denies chest pains, palpitations and leg swelling .   Denies dysuria, frequency, hesitancy or incontinence. Chronic  joint pain,  and limitation in mobility. Denies headaches, seizures, numbness, or tingling. Chronic  depression, anxiety and  insomnia. Denies skin break down or rash.   PE  BP (!) 89/48   Pulse 67   Resp 16   Ht '5\' 5"'$  (1.651 m)   Wt 131 lb (59.4 kg)   SpO2 90%   BMI 21.80 kg/m   Patient alert and oriented HEENT: No facial asymmetry, EOMI,     Neck supple .no sinus tenderness  Chest: decreased air entry, no  wheezes heard scattered basilar crackles  CVS: S1, S2 no murmurs, no S3.Regular rate.  ABD: Soft non tender.   Ext: No edema  MS: Adequate though reduced  ROM spine, shoulders, hips and knees.Tender over both shins  Skin: Intact, no ulcerations or rash noted.Subcutaneous nodules/lipomas palpable  on both shins  Psych: Good eye contact, normal affect. Memory intact not anxious or depressed appearing.  CNS: CN 2-12 intact, power,  normal throughout.no focal deficits noted.   Assessment & Plan  Pneumonia Clinically improved, completing 14 day antibiotic coursse, f/u with pulmonary as planned, call if deterioration occurs  Pain in the shins Pain in both shins, tender to palpation, X rays to be obtained   Subcutaneous mass of both lower legs Palpable and visible masses/ lumps on both shins, refer for [place] to further assess pt concerned and has dx of MPN  Encounter for support and coordination of transition of care Patient in for follow up of recent hospitalization. Discharge summary, and laboratory and radiology data are reviewed, and any questions or concerns  are discussed. Specific issues requiring follow up are specifically addressed.   Labile hypertension Still persists and was noted during hospitalization, will attempt sooner follow up with Cardiology, no change made in medication she is discharged on, asymptomatic despite low blood pressure

## 2021-11-29 NOTE — Assessment & Plan Note (Addendum)
Palpable and visible masses/ lumps on both shins, refer for [place] to further assess pt concerned and has dx of MPN

## 2021-11-29 NOTE — Assessment & Plan Note (Addendum)
Still persists and was noted during hospitalization, will attempt sooner follow up with Cardiology, no change made in medication she is discharged on, asymptomatic despite low blood pressure

## 2021-11-29 NOTE — Assessment & Plan Note (Signed)
Patient in for follow up of recent hospitalization. Discharge summary, and laboratory and radiology data are reviewed, and any questions or concerns  are discussed. Specific issues requiring follow up are specifically addressed.  

## 2021-11-30 ENCOUNTER — Encounter: Payer: Self-pay | Admitting: Family Medicine

## 2021-11-30 ENCOUNTER — Ambulatory Visit (INDEPENDENT_AMBULATORY_CARE_PROVIDER_SITE_OTHER): Payer: Medicare Other | Admitting: Family Medicine

## 2021-11-30 DIAGNOSIS — R0902 Hypoxemia: Secondary | ICD-10-CM

## 2021-11-30 DIAGNOSIS — M3214 Glomerular disease in systemic lupus erythematosus: Secondary | ICD-10-CM | POA: Diagnosis not present

## 2021-11-30 DIAGNOSIS — R059 Cough, unspecified: Secondary | ICD-10-CM | POA: Diagnosis not present

## 2021-11-30 DIAGNOSIS — J151 Pneumonia due to Pseudomonas: Secondary | ICD-10-CM | POA: Diagnosis not present

## 2021-11-30 DIAGNOSIS — N1832 Chronic kidney disease, stage 3b: Secondary | ICD-10-CM | POA: Diagnosis not present

## 2021-11-30 DIAGNOSIS — J9809 Other diseases of bronchus, not elsewhere classified: Secondary | ICD-10-CM | POA: Diagnosis not present

## 2021-11-30 DIAGNOSIS — J479 Bronchiectasis, uncomplicated: Secondary | ICD-10-CM | POA: Diagnosis not present

## 2021-11-30 DIAGNOSIS — J439 Emphysema, unspecified: Secondary | ICD-10-CM | POA: Diagnosis not present

## 2021-11-30 MED ORDER — PROMETHAZINE-DM 6.25-15 MG/5ML PO SYRP: ORAL_SOLUTION | ORAL | 0 refills | Status: DC

## 2021-11-30 NOTE — Patient Instructions (Signed)
F/u as before, call if you need me sooner  Phenergan DM is prescribed to help to suppress your cough  Please come by the office today for oxygen level to be checked at rest and with activity to see if you require supplemental oxygen  Sputum to be submitted for culture if you continue to cough excessively with increased sputum production, please take the cup and order sheet home with you when you come by today  Thanks for choosing [facility], we consider it a privelige to serve you.

## 2021-12-01 ENCOUNTER — Ambulatory Visit: Payer: Medicare Other

## 2021-12-01 ENCOUNTER — Ambulatory Visit: Payer: Medicare Other | Admitting: Urology

## 2021-12-01 ENCOUNTER — Encounter: Payer: Self-pay | Admitting: Family Medicine

## 2021-12-01 ENCOUNTER — Ambulatory Visit: Payer: Medicare Other | Admitting: Pulmonary Disease

## 2021-12-01 DIAGNOSIS — R0902 Hypoxemia: Secondary | ICD-10-CM

## 2021-12-01 HISTORY — DX: Hypoxemia: R09.02

## 2021-12-01 NOTE — Assessment & Plan Note (Signed)
Hypoxic at rest , recovers wit 2 L oxygen, requires home oxygen at 2 L/ min same to be ordered for delivery today

## 2021-12-01 NOTE — Assessment & Plan Note (Signed)
Cough with grey sputum [production and hypoxia, resubmit sputum f/u with pulmonary

## 2021-12-01 NOTE — Progress Notes (Signed)
Virtual Visit via Video Note  I connected with Renee Waters on 11/30/2021 at  3:00 PM EDT by a video enabled telemedicine application and verified that I am speaking with the correct person using two identifiers.  Location: Patient: home Provider: office   I discussed the limitations of evaluation and management by telemedicine and the availability of in person appointments. The patient expressed understanding and agreed to proceed.  History of Present Illness:   c/o cough and wheezing, grey sputum in past 2 days, sytates H/h nurse checked oxygen and level was 85 to 89, asking for cough medication , denies fever or chills Asked to come in the same day to establish need for oxygen, has come in the following day Observations/Objective:  Oxygen room air at rest 87% After walking 85% Walking with 2 L 95% Assessment and Plan:  Hypoxia Hypoxic at rest , recovers wit 2 L oxygen, requires home oxygen at 2 L/ min same to be ordered for delivery today  Pneumonia Cough with grey sputum [production and hypoxia, resubmit sputum f/u with pulmonary  Follow Up Instructions:    I discussed the assessment and treatment plan with the patient. The patient was provided an opportunity to ask questions and all were answered. The patient agreed with the plan and demonstrated an understanding of the instructions.   The patient was advised to call back or seek an in-person evaluation if the symptoms worsen or if the condition fails to improve as anticipated.  I provided 22 minutes of non-face-to-face time during this encounter.   Tula Nakayama, MD

## 2021-12-02 ENCOUNTER — Telehealth: Payer: Self-pay

## 2021-12-02 DIAGNOSIS — D47Z9 Other specified neoplasms of uncertain behavior of lymphoid, hematopoietic and related tissue: Secondary | ICD-10-CM | POA: Diagnosis not present

## 2021-12-02 DIAGNOSIS — Z515 Encounter for palliative care: Secondary | ICD-10-CM | POA: Diagnosis not present

## 2021-12-02 DIAGNOSIS — R059 Cough, unspecified: Secondary | ICD-10-CM | POA: Diagnosis not present

## 2021-12-02 NOTE — Telephone Encounter (Signed)
Patient called asking why does she have such a big oxygen machine. She thought it was for only what is going on right now with her. Please return patient call back, patient said you could call her back Friday [phone].

## 2021-12-03 ENCOUNTER — Ambulatory Visit: Payer: Medicare Other | Admitting: Family Medicine

## 2021-12-03 ENCOUNTER — Other Ambulatory Visit: Payer: Self-pay | Admitting: Family Medicine

## 2021-12-03 NOTE — Telephone Encounter (Signed)
Patient aware.

## 2021-12-03 NOTE — Telephone Encounter (Signed)
Pls try to get a smaller , portable tank , if possible

## 2021-12-06 ENCOUNTER — Ambulatory Visit (HOSPITAL_COMMUNITY)
Admission: RE | Admit: 2021-12-06 | Discharge: 2021-12-06 | Disposition: A | Discharge status: Home / Self Care | Payer: Medicare Other | Source: Ambulatory Visit | Attending: Family Medicine | Admitting: Family Medicine

## 2021-12-06 ENCOUNTER — Other Ambulatory Visit: Payer: Self-pay | Admitting: Family Medicine

## 2021-12-06 DIAGNOSIS — R2243 Localized swelling, mass and lump, lower limb, bilateral: Secondary | ICD-10-CM

## 2021-12-06 DIAGNOSIS — R2242 Localized swelling, mass and lump, left lower limb: Secondary | ICD-10-CM

## 2021-12-06 DIAGNOSIS — R2241 Localized swelling, mass and lump, right lower limb: Secondary | ICD-10-CM | POA: Diagnosis not present

## 2021-12-06 DIAGNOSIS — Z0389 Encounter for observation for other suspected diseases and conditions ruled out: Secondary | ICD-10-CM | POA: Diagnosis not present

## 2021-12-06 NOTE — Progress Notes (Signed)
Fluconazole for scant yeast, however has red dye which she is allergic to

## 2021-12-06 NOTE — Progress Notes (Signed)
Patient's daughter called questioning Jakafi and when to restart. [name] reviewed the chart. Patient's daughter confirmed that the patient is off of antibiotics. Patient to restart Jakafi at previous dose and call with further concerns. Patient's daughter verbalized understanding and will pass these instructions along to the patient.

## 2021-12-07 ENCOUNTER — Other Ambulatory Visit: Payer: Self-pay | Admitting: Family Medicine

## 2021-12-07 MED ORDER — CLOTRIMAZOLE 10 MG MT TROC: OROMUCOSAL | 0 refills | Status: DC

## 2021-12-07 NOTE — Progress Notes (Signed)
Clotrimazole troche

## 2021-12-08 DIAGNOSIS — M47812 Spondylosis without myelopathy or radiculopathy, cervical region: Secondary | ICD-10-CM | POA: Diagnosis not present

## 2021-12-08 DIAGNOSIS — M47816 Spondylosis without myelopathy or radiculopathy, lumbar region: Secondary | ICD-10-CM | POA: Diagnosis not present

## 2021-12-08 DIAGNOSIS — Z79891 Long term (current) use of opiate analgesic: Secondary | ICD-10-CM | POA: Diagnosis not present

## 2021-12-08 DIAGNOSIS — G894 Chronic pain syndrome: Secondary | ICD-10-CM | POA: Diagnosis not present

## 2021-12-09 ENCOUNTER — Encounter: Payer: Self-pay | Admitting: Internal Medicine

## 2021-12-09 ENCOUNTER — Ambulatory Visit: Payer: Medicare Other | Admitting: Internal Medicine

## 2021-12-09 ENCOUNTER — Telehealth (INDEPENDENT_AMBULATORY_CARE_PROVIDER_SITE_OTHER): Payer: Medicare Other | Admitting: Internal Medicine

## 2021-12-09 ENCOUNTER — Encounter: Payer: Self-pay | Admitting: Family Medicine

## 2021-12-09 ENCOUNTER — Ambulatory Visit (HOSPITAL_COMMUNITY)
Admission: RE | Admit: 2021-12-09 | Discharge: 2021-12-09 | Disposition: A | Discharge status: Home / Self Care | Payer: Medicare Other | Source: Ambulatory Visit | Attending: Internal Medicine | Admitting: Internal Medicine

## 2021-12-09 DIAGNOSIS — M25551 Pain in right hip: Secondary | ICD-10-CM | POA: Insufficient documentation

## 2021-12-09 LAB — SPUTUM CULTURE

## 2021-12-09 LAB — GRAM STAIN W/SPUTUM CULT RFLX
Epithelial Cells: NONE SEEN
Result 1: NONE SEEN

## 2021-12-09 NOTE — Progress Notes (Signed)
   Acute Video Visit  Virtual Visit via Video Note  I connected with Renee Waters on 12/09/21 at  2:20 PM EDT by a video enabled telemedicine application and verified that I am speaking with the correct person using two identifiers.  Location: Patient: [place], [place] [address] Provider: [address], [place], [place] [address]   I discussed the limitations of evaluation and management by telemedicine and the availability of in person appointments. The patient expressed understanding and agreed to proceed.  History of Present Illness:  Renee Waters was evaluated today via virtual encounter for right hip pain.  She reports falling last night and experiencing sudden onset pain in her right hip.  She states that her left foot got caught under a dresser.  She fell forward, landing on her left knee and then her right hip.  She endorses sudden onset of sharp pain on the lateral portion of her right hip.  Renee Waters was initially unable to bear weight on her right leg and is now able to bear slight weight.  She states that her pain is not improving and she currently rates it 10/10.  She has been taking oxycodone 5 mg for pain relief.  She denies numbness/tingling in the right leg.  She has not appreciated any leg length discrepancy or abnormal rotation of the right leg.  She is able to bend her right knee but endorses pain.  She did not hit her head or lose consciousness during her fall.  Assessment and Plan:  Acute onset right hip pain She endorses sudden onset right lateral hip pain after a fall last evening.  She rates her pain as 10/10 and has been unable to bear weight on the right leg. -Right hip x-rays have been ordered. -I recommended that she continue oxycodone for pain relief as needed -Further course pending x-ray results   Follow Up Instructions:    I discussed the assessment and treatment plan with the patient. The patient was provided an opportunity to ask questions and all  were answered. The patient agreed with the plan and demonstrated an understanding of the instructions.   The patient was advised to call back or seek an in-person evaluation if the symptoms worsen or if the condition fails to improve as anticipated.  I provided 7 minutes of non-face-to-face time during this encounter.   Johnette Abraham, MD

## 2021-12-09 NOTE — Telephone Encounter (Signed)
scheduled

## 2021-12-13 ENCOUNTER — Other Ambulatory Visit: Payer: Self-pay | Admitting: Nurse Practitioner

## 2021-12-13 ENCOUNTER — Inpatient Hospital Stay: Payer: Medicare Other | Attending: Hematology

## 2021-12-13 ENCOUNTER — Inpatient Hospital Stay (HOSPITAL_BASED_OUTPATIENT_CLINIC_OR_DEPARTMENT_OTHER): Payer: Medicare Other | Admitting: Hematology

## 2021-12-13 VITALS — BP 93/60 | HR 71 | Temp 96.1°F | Resp 18 | Ht 63.98 in | Wt 128.6 lb

## 2021-12-13 DIAGNOSIS — I959 Hypotension, unspecified: Secondary | ICD-10-CM

## 2021-12-13 DIAGNOSIS — R300 Dysuria: Secondary | ICD-10-CM

## 2021-12-13 DIAGNOSIS — Z95828 Presence of other vascular implants and grafts: Secondary | ICD-10-CM

## 2021-12-13 DIAGNOSIS — E876 Hypokalemia: Secondary | ICD-10-CM | POA: Diagnosis not present

## 2021-12-13 DIAGNOSIS — D649 Anemia, unspecified: Secondary | ICD-10-CM

## 2021-12-13 DIAGNOSIS — L299 Pruritus, unspecified: Secondary | ICD-10-CM | POA: Insufficient documentation

## 2021-12-13 DIAGNOSIS — Z87891 Personal history of nicotine dependence: Secondary | ICD-10-CM | POA: Diagnosis not present

## 2021-12-13 DIAGNOSIS — Z79899 Other long term (current) drug therapy: Secondary | ICD-10-CM | POA: Diagnosis not present

## 2021-12-13 DIAGNOSIS — N183 Chronic kidney disease, stage 3 unspecified: Secondary | ICD-10-CM | POA: Diagnosis not present

## 2021-12-13 DIAGNOSIS — Z96641 Presence of right artificial hip joint: Secondary | ICD-10-CM | POA: Diagnosis not present

## 2021-12-13 DIAGNOSIS — D471 Chronic myeloproliferative disease: Secondary | ICD-10-CM | POA: Diagnosis not present

## 2021-12-13 DIAGNOSIS — R634 Abnormal weight loss: Secondary | ICD-10-CM | POA: Insufficient documentation

## 2021-12-13 DIAGNOSIS — D72829 Elevated white blood cell count, unspecified: Secondary | ICD-10-CM | POA: Insufficient documentation

## 2021-12-13 DIAGNOSIS — M8580 Other specified disorders of bone density and structure, unspecified site: Secondary | ICD-10-CM | POA: Insufficient documentation

## 2021-12-13 LAB — CBC WITH DIFFERENTIAL/PLATELET
Abs Immature Granulocytes: 34.1 10*3/uL — ABNORMAL HIGH (ref 0.00–0.07)
Band Neutrophils: 17 %
Basophils Absolute: 0 10*3/uL (ref 0.0–0.1)
Basophils Relative: 0 %
Blasts: 3 %
Eosinophils Absolute: 0 10*3/uL (ref 0.0–0.5)
Eosinophils Relative: 0 %
HCT: 22.9 % — ABNORMAL LOW (ref 36.0–46.0)
Hemoglobin: 7.4 g/dL — ABNORMAL LOW (ref 12.0–15.0)
Lymphocytes Relative: 5 %
Lymphs Abs: 8.5 10*3/uL — ABNORMAL HIGH (ref 0.7–4.0)
MCH: 30 pg (ref 26.0–34.0)
MCHC: 32.3 g/dL (ref 30.0–36.0)
MCV: 92.7 fL (ref 80.0–100.0)
Metamyelocytes Relative: 9 %
Monocytes Absolute: 10.2 10*3/uL — ABNORMAL HIGH (ref 0.1–1.0)
Monocytes Relative: 6 %
Myelocytes: 7 %
Neutro Abs: 112.7 10*3/uL — ABNORMAL HIGH (ref 1.7–7.7)
Neutrophils Relative %: 49 %
Platelets: 135 10*3/uL — ABNORMAL LOW (ref 150–400)
Promyelocytes Relative: 4 %
RBC: 2.47 MIL/uL — ABNORMAL LOW (ref 3.87–5.11)
RDW: 27.4 % — ABNORMAL HIGH (ref 11.5–15.5)
WBC: 170.7 10*3/uL (ref 4.0–10.5)
nRBC: 2.7 % — ABNORMAL HIGH (ref 0.0–0.2)

## 2021-12-13 LAB — PREPARE RBC (CROSSMATCH)

## 2021-12-13 LAB — COMPREHENSIVE METABOLIC PANEL
ALT: 19 U/L (ref 0–44)
AST: 36 U/L (ref 15–41)
Albumin: 3.3 g/dL — ABNORMAL LOW (ref 3.5–5.0)
Alkaline Phosphatase: 166 U/L — ABNORMAL HIGH (ref 38–126)
Anion gap: 5 (ref 5–15)
BUN: 41 mg/dL — ABNORMAL HIGH (ref 8–23)
CO2: 24 mmol/L (ref 22–32)
Calcium: 8 mg/dL — ABNORMAL LOW (ref 8.9–10.3)
Chloride: 109 mmol/L (ref 98–111)
Creatinine, Ser: 2.25 mg/dL — ABNORMAL HIGH (ref 0.44–1.00)
GFR, Estimated: 22 mL/min — ABNORMAL LOW (ref >60)
Glucose, Bld: 123 mg/dL — ABNORMAL HIGH (ref 70–99)
Potassium: 4 mmol/L (ref 3.5–5.1)
Sodium: 138 mmol/L (ref 135–145)
Total Bilirubin: 0.6 mg/dL (ref 0.3–1.2)
Total Protein: 7.6 g/dL (ref 6.5–8.1)

## 2021-12-13 LAB — SAMPLE TO BLOOD BANK

## 2021-12-13 MED ORDER — SODIUM CHLORIDE 0.9 % IV SOLN: INTRAVENOUS | Status: DC

## 2021-12-13 MED ORDER — SODIUM CHLORIDE 0.9% FLUSH
10.0000 mL | Freq: Once | INTRAVENOUS | Status: AC
  Administered 2021-12-13: 10 mL via INTRAVENOUS

## 2021-12-13 MED ORDER — HEPARIN SOD (PORK) LOCK FLUSH 100 UNIT/ML IV SOLN
500.0000 [IU] | Freq: Once | INTRAVENOUS | Status: AC
  Administered 2021-12-13: 500 [IU] via INTRAVENOUS

## 2021-12-13 NOTE — Patient Instructions (Addendum)
[facility] at [facility] Discharge Instructions   You were seen and examined today by [name].  He reviewed the results of your lab work. Your white count is elevated at 170. Your hemoglobin is 7.4. We will give you a unit of blood tomorrow.   Please stop taking your Losartan.   We will refer you back to Dr. Florene Glen at [facility].   Return as scheduled.    Thank you for choosing [place] at [facility] to provide your oncology and hematology care.  To afford each patient quality time with our provider, please arrive at least 15 minutes before your scheduled appointment time.   If you have a lab appointment with the [facility] please come in thru the Main Entrance and check in at the main information desk.  You need to re-schedule your appointment should you arrive 10 or more minutes late.  We strive to give you quality time with our providers, and arriving late affects you and other patients whose appointments are after yours.  Also, if you no show three or more times for appointments you may be dismissed from the clinic at the providers discretion.     Again, thank you for choosing [facility].  Our hope is that these requests will decrease the amount of time that you wait before being seen by our physicians.       _____________________________________________________________  Should you have questions after your visit to [facility], please contact our office at [phone] and follow the prompts.  Our office hours are 8:00 a.m. and 4:30 p.m. Monday - Friday.  Please note that voicemails left after 4:00 p.m. may not be returned until the following business day.  We are closed weekends and major holidays.  You do have access to a nurse 24-7, just call the main number to the clinic [phone] and do not press any options, hold on the line and a nurse will answer the phone.    For prescription refill  requests, have your pharmacy contact our office and allow 72 hours.    Due to Covid, you will need to wear a mask upon entering the hospital. If you do not have a mask, a mask will be given to you at the Main Entrance upon arrival. For doctor visits, patients may have 1 support person age 39 or older with them. For treatment visits, patients can not have anyone with them due to social distancing guidelines and our immunocompromised population.

## 2021-12-13 NOTE — Patient Instructions (Signed)
[place]  Discharge Instructions: Thank you for choosing [facility] to provide your oncology and hematology care.  If you have a lab appointment with the [facility], please come in thru the Main Entrance and check in at the main information desk.  Wear comfortable clothing and clothing appropriate for easy access to any Portacath or PICC line.   We strive to give you quality time with your provider. You may need to reschedule your appointment if you arrive late (15 or more minutes).  Arriving late affects you and other patients whose appointments are after yours.  Also, if you miss three or more appointments without notifying the office, you may be dismissed from the clinic at the provider's discretion.      For prescription refill requests, have your pharmacy contact our office and allow 72 hours for refills to be completed.    Today you received the following labs drawn and 553m of Normal Saline given, return as scheduled.   To help prevent nausea and vomiting after your treatment, we encourage you to take your nausea medication as directed.  BELOW ARE SYMPTOMS THAT SHOULD BE REPORTED IMMEDIATELY: *FEVER GREATER THAN 100.4 F (38 C) OR HIGHER *CHILLS OR SWEATING *NAUSEA AND VOMITING THAT IS NOT CONTROLLED WITH YOUR NAUSEA MEDICATION *UNUSUAL SHORTNESS OF BREATH *UNUSUAL BRUISING OR BLEEDING *URINARY PROBLEMS (pain or burning when urinating, or frequent urination) *BOWEL PROBLEMS (unusual diarrhea, constipation, pain near the anus) TENDERNESS IN MOUTH AND THROAT WITH OR WITHOUT PRESENCE OF ULCERS (sore throat, sores in mouth, or a toothache) UNUSUAL RASH, SWELLING OR PAIN  UNUSUAL VAGINAL DISCHARGE OR ITCHING   Items with * indicate a potential emergency and should be followed up as soon as possible or go to the Emergency Department if any problems should occur.  Please show the CHEMOTHERAPY ALERT CARD or IMMUNOTHERAPY ALERT CARD at check-in to  the Emergency Department and triage nurse.  Should you have questions after your visit or need to cancel or reschedule your appointment, please contact [phone] and follow the prompts.  Office hours are 8:00 a.m. to 4:30 p.m. Monday - Friday. Please note that voicemails left after 4:00 p.m. may not be returned until the following business day.  We are closed weekends and major holidays. You have access to a nurse at all times for urgent questions. Please call the main number to the clinic [phone] follow the prompts.  For any non-urgent questions, you may also contact your provider using MyChart. We now offer e-Visits for anyone 162and older to request care online for non-urgent symptoms. For details visit [email or web address]   Also download the MyChart app! Go to the app store, search "MyChart", open the app, select [facility], and log in with your MyChart username and password.  Masks are optional in the cancer centers. If you would like for your care team to wear a mask while they are taking care of you, please let them know. You may have one support person who is at least 76years old accompany you for your appointments.

## 2021-12-13 NOTE — Progress Notes (Signed)
CRITICAL VALUE ALERT Critical value received:  WBC 170.7 Date of notification:  12-13-2021 Time of notification: 13:15pm.  Critical value read back:  Yes.   Nurse who received alert:  B. Michiko Lineman RN MD notified time and response:  [name] '@13'$ :30 pm.

## 2021-12-13 NOTE — Progress Notes (Signed)
Patient presents today for port flush lab. Patient's BP 78/51 [name] made aware, received orders for 555m of Normal Saline.  Patient tolerated hydration therapy with no complaints voiced. Side effects with management reviewed with understanding verbalized. Port site clean and dry with no bruising or swelling noted at site. Good blood return noted before and after administration of therapy. Band aid applied. Patient left in satisfactory condition with VSS and no s/s of distress noted.

## 2021-12-13 NOTE — Progress Notes (Signed)
[place], [place] [address]   CLINIC:  Medical Oncology/Hematology  PCP:  Renee Helper, MD [address], [place] / [place] [address] [phone]   REASON FOR VISIT:  Follow-up for MPL positive myeloproliferative neoplasm  PRIOR THERAPY: none  NGS Results: not done  CURRENT THERAPY: Jakafi 10 mg twice daily  INTERVAL HISTORY:  [name], a 76 y.o. female, seen for follow-up of MPL positive myeloproliferative neoplasm.  She reports having started back on Jakafi on 12/06/2021.  She reports some dizziness.  The blood pressure today is 78/51.  Reports decrease in energy levels.  Denies any falls.  REVIEW OF SYSTEMS:  Review of Systems  Respiratory:  Positive for cough.   Musculoskeletal:  Positive for arthralgias (Hip, back and legs stable).  Neurological:  Positive for dizziness.  All other systems reviewed and are negative.   PAST MEDICAL/SURGICAL HISTORY:  Past Medical History:  Diagnosis Date   Acute cholangitis    Allergy    Anemia    Anxiety    Arthritis    Phreesia 03/18/2020   Barrett's esophagus    Cataract    Chronic back pain    Chronic neck pain    CKD (chronic kidney disease) stage 3, GFR 30-59 ml/min ([place]) 10/29/2020   Depression    Genital herpes    GERD (gastroesophageal reflux disease)    H/O degenerative disc disease    History of blood transfusion    Hypertension    Insomnia    Lupus (systemic lupus erythematosus) ([place])    Neuromuscular disorder ([place])    Osteoarthritis    S/P colonoscopy June 2005   normal, no polyps   S/P endoscopy June 2005, Oct 2009   2005: short-segment Barrett's, 2009: short-segment Barrett's   Upper respiratory tract infection 07/09/2020   UTI (lower urinary tract infection) 11/2012   Past Surgical History:  Procedure Laterality Date   ABDOMINAL HYSTERECTOMY     BACK SURGERY     BIOPSY N/A 03/20/2014   Procedure: BIOPSY;  Surgeon: Renee Dolin, MD;   Location: [facility];  Service: Endoscopy;  Laterality: N/A;   BIOPSY  09/14/2015   Procedure: BIOPSY;  Surgeon: Renee Dolin, MD;  Location: [facility];  Service: Endoscopy;;  esophageal and gastric   BIOPSY  07/23/2019   Procedure: BIOPSY;  Surgeon: Renee Dolin, MD;  Location: [facility];  Service: Endoscopy;;  esophageal    CARPAL TUNNEL RELEASE Left 2013   cervical disectomy  2002   CESAREAN SECTION N/A    Phreesia 03/18/2020   CHOLECYSTECTOMY     with lysis of adhesions for sbo; "ruptured gallbladder".   COLONOSCOPY  11/09/2011   RMR: Melanosis coli   COLONOSCOPY WITH PROPOFOL N/A 09/14/2015   Dr. Gala Waters: diverticulosis, 31m TA removed. next TCS 09/2020.    COLONOSCOPY WITH PROPOFOL N/A 04/30/2020   Procedure: COLONOSCOPY WITH PROPOFOL;  Surgeon: RDaneil Dolin MD;  Location: [facility];  Service: Endoscopy;  Laterality: N/A;  PM (ASA 3)   DENTAL SURGERY  11/2015   multiple tooth extraction   ESOPHAGOGASTRODUODENOSCOPY  11/29/2007   salmon-colored  tongue   longest stable at  3 cm, distal esophagus as described previously status post biopsy/ Hiatal hernia, otherwise normal stomach D1 and D2   ESOPHAGOGASTRODUODENOSCOPY  01/06/11   short segment Barrett's esophagus s/p bx/Hiatal hernia   ESOPHAGOGASTRODUODENOSCOPY (EGD) WITH PROPOFOL N/A 03/20/2014   RWPY:KDXIPJASdistal esophagus short segment  barrett's, bx with no dysplasia. next egd in 03/2017   ESOPHAGOGASTRODUODENOSCOPY (EGD) WITH PROPOFOL N/A 09/14/2015   Dr. Gala Waters: Barrett's without dysplasia, gastritis benign bx, hiatal hernia. next EGD 09/2018.   ESOPHAGOGASTRODUODENOSCOPY (EGD) WITH PROPOFOL N/A 07/23/2019   Procedure: ESOPHAGOGASTRODUODENOSCOPY (EGD) WITH PROPOFOL;  Surgeon: Renee Dolin, MD;  [facility];  Service: Endoscopy;  Laterality: N/A;  3:00pm   EYE SURGERY N/A    Phreesia 03/18/2020   HERNIA REPAIR Right 07/2010   Dr. Zada Waters   IR IMAGING GUIDED PORT INSERTION  02/09/2021   JOINT REPLACEMENT      LAPAROSCOPIC CHOLECYSTECTOMY  2017   at [facility]   POLYPECTOMY  09/14/2015   Procedure: POLYPECTOMY;  Surgeon: Renee Dolin, MD;  Location: [facility];  Service: Endoscopy;;  ascending colon   right hip replacement  07/2010   went back in sept 2012 to fix   SHOULDER ARTHROSCOPY  2008   left   SPINE SURGERY N/A    Phreesia 03/18/2020   TOTAL HIP REVISION Right 12/17/2012   Procedure: RIGHT TOTAL HIP REVISION;  Surgeon: Renee Pole, MD;  Location: [facility];  Service: Orthopedics;  Laterality: Right;   WRIST SURGERY Right 2011   open reduction right wrist.    SOCIAL HISTORY:  Social History   Socioeconomic History   Marital status: Married    Spouse name: Renee Waters   Number of children: 4   Years of education: 12+   Highest education level: Some college, no degree  Occupational History   Occupation: Press photographer  - retired   Occupation: non profit  Tobacco Use   Smoking status: Former    Packs/day: 0.25    Years: 25.00    Total pack years: 6.25    Types: Cigarettes    Quit date: 02/07/2003    Years since quitting: 18.8   Smokeless tobacco: Never   Tobacco comments:    quit in 2004  Vaping Use   Vaping Use: Never used  Substance and Sexual Activity   Alcohol use: No   Drug use: No   Sexual activity: Not Currently    Birth control/protection: Surgical  Other Topics Concern   Not on file  Social History Narrative   Not on file   Social Determinants of Health   Financial Resource Strain: Low Risk  (12/28/2020)   Overall Financial Resource Strain (CARDIA)    Difficulty of Paying Living Expenses: Not hard at all  Food Insecurity: No Food Insecurity (11/11/2021)   Hunger Vital Sign    Worried About Running Out of Food in the Last Year: Never true    [place] in the Last Year: Never true  Transportation Needs: No Transportation Needs (11/11/2021)   PRAPARE - Hydrologist (Medical): No    Lack of Transportation (Non-Medical): No   Physical Activity: Inactive (04/23/2021)   Exercise Vital Sign    Days of Exercise per Week: 0 days    Minutes of Exercise per Session: 0 min  Stress: Stress Concern Present (04/23/2021)   [place]    Feeling of Stress : Rather much  Social Connections: Moderately Integrated (12/28/2020)   Social Connection and Isolation Panel [NHANES]    Frequency of Communication with Friends and Family: More than three times a week    Frequency of Social Gatherings with Friends and Family: Once a week    Attends Religious Services: More than 4 times per  year    Active Member of Clubs or Organizations: No    Attends Archivist Meetings: Never    Marital Status: Married  Human resources officer Violence: Not At Risk (11/11/2021)   Humiliation, Afraid, Rape, and Kick questionnaire    Fear of Current or Ex-Partner: No    Emotionally Abused: No    Physically Abused: No    Sexually Abused: No    FAMILY HISTORY:  Family History  Problem Relation Age of Onset   Hypertension Mother    Stroke Mother    Colon cancer Neg Hx    Anesthesia problems Neg Hx    Hypotension Neg Hx    Malignant hyperthermia Neg Hx    Pseudochol deficiency Neg Hx    Gastric cancer Neg Hx    Esophageal cancer Neg Hx     CURRENT MEDICATIONS:  Current Outpatient Medications  Medication Sig Dispense Refill   acyclovir (ZOVIRAX) 800 MG tablet Take 1 tablet (800 mg total) by mouth 5 (five) times daily. 50 tablet 1   acyclovir ointment (ZOVIRAX) 5 % Apply 1 application. topically every 3 (three) hours. 15 g 1   albuterol (PROVENTIL) (2.5 MG/3ML) 0.083% nebulizer solution Take 3 mLs (2.5 mg total) by nebulization every 4 (four) hours as needed for wheezing or shortness of breath. 75 mL 12   albuterol (VENTOLIN HFA) 108 (90 Base) MCG/ACT inhaler Inhale 2 puffs into the lungs every 4 (four) hours as needed for wheezing or shortness of breath. 8 g 11   allopurinol  (ZYLOPRIM) 300 MG tablet Take 1 tablet (300 mg total) by mouth daily. 30 tablet 3   budesonide-formoterol (SYMBICORT) 80-4.5 MCG/ACT inhaler Take 2 puffs first thing in am and then another 2 puffs about 12 hours later. 10.2 g 11   cetirizine (ZYRTEC) 10 MG tablet Take 10 mg by mouth daily.     citalopram (CELEXA) 20 MG tablet Take 1 tablet by mouth once daily 30 tablet 3   clotrimazole (MYCELEX) 10 MG troche Take one tablet by mouth four times daily 12 tablet 0   EPINEPHRINE 0.3 mg/0.3 mL IJ SOAJ injection INJECT 0.3 MLS INTO MUSCLE ONCE AS NEEDED (Patient taking differently: Inject 0.3 mg into the muscle as needed for anaphylaxis.) 1 Device 2   furosemide (LASIX) 20 MG tablet Take 1 tablet (20 mg total) by mouth daily as needed. 30 tablet 2   gabapentin (NEURONTIN) 300 MG capsule Take one capsule by mouth once daily, as needed, for pain (Patient taking differently: Take 300 mg by mouth daily as needed (Pain).) 90 capsule 1   lidocaine-prilocaine (EMLA) cream Apply 1 Application topically as needed (Access to port and pain). 30 g 6   losartan (COZAAR) 25 MG tablet Take 1 tablet by mouth once daily for blood pressure 30 tablet 0   metoprolol tartrate (LOPRESSOR) 25 MG tablet Take 1 tablet (25 mg total) by mouth 2 (two) times daily. 90 tablet 1   mirtazapine (REMERON) 15 MG tablet Take 1 tablet (15 mg total) by mouth at bedtime. 30 tablet 5   Multiple Vitamin (MULTIVITAMIN WITH MINERALS) TABS tablet Take 1 tablet by mouth every other day.     oxyCODONE (OXY IR/ROXICODONE) 5 MG immediate release tablet Take 5 mg by mouth every 4 (four) hours as needed for moderate pain, severe pain or breakthrough pain.     pantoprazole (PROTONIX) 40 MG tablet Take 1 tablet (40 mg total) by mouth 2 (two) times daily before a meal. 60 tablet 0  potassium chloride (KLOR-CON) 10 MEQ tablet Take 1 tablet by mouth once daily 30 tablet 0   promethazine-dextromethorphan (PROMETHAZINE-DM) 6.25-15 MG/5ML syrup Take half to one   teaspoon twice daily as needed for excessive cough 180 mL 0   ruxolitinib phosphate (JAKAFI) 15 MG tablet Take 1 tablet (15 mg total) by mouth 2 (two) times daily. 60 tablet 3   temazepam (RESTORIL) 30 MG capsule Take 1 capsule (30 mg total) by mouth at bedtime as needed for sleep. 30 capsule 5   tiZANidine (ZANAFLEX) 4 MG tablet Take 2 tablets (8 mg total) by mouth 3 (three) times daily as needed (spasms). (Patient taking differently: Take 4-8 mg by mouth 3 (three) times daily as needed for muscle spasms.) 180 tablet 0   No current facility-administered medications for this visit.   Facility-Administered Medications Ordered in Other Visits  Medication Dose Route Frequency Provider Last Rate Last Admin   0.9 %  sodium chloride infusion   Intravenous Continuous Derek Jack, MD   Stopped at 12/13/21 1342   lanreotide acetate (SOMATULINE DEPOT) 120 MG/0.5ML injection            octreotide (SANDOSTATIN LAR) 30 MG IM injection             ALLERGIES:  Allergies  Allergen Reactions   Bee Venom Swelling and Hives   Norvasc [Amlodipine] Other (See Comments)    Hair loss   Tylenol [Acetaminophen] Itching   Red Dye Itching   Percocet [Oxycodone-Acetaminophen] Rash and Other (See Comments)    Pt states, "this gives her a rash, but at home she takes oxycodone for pain relief"    Tyloxapol Itching and Rash    PHYSICAL EXAM:  Performance status (ECOG): 1 - Symptomatic but completely ambulatory  There were no vitals filed for this visit.  Wt Readings from Last 3 Encounters:  12/13/21 128 lb 9.6 oz (58.3 kg)  11/24/21 131 lb (59.4 kg)  11/17/21 132 lb 4.4 oz (60 kg)   Physical Exam Vitals reviewed.  Constitutional:      Appearance: Normal appearance.  Cardiovascular:     Rate and Rhythm: Normal rate and regular rhythm.     Pulses: Normal pulses.     Heart sounds: Normal heart sounds.  Pulmonary:     Effort: Pulmonary effort is normal.     Breath sounds: Normal breath sounds.   Abdominal:     Palpations: Abdomen is soft. There is hepatomegaly (palpable 3 fingerbreadths below costal margin) and splenomegaly (2 fingerbreadths below costal margin'). There is no mass.     Tenderness: There is no abdominal tenderness.  Musculoskeletal:     Right lower leg: 1+ Edema present.     Left lower leg: 1+ Edema present.  Neurological:     General: No focal deficit present.     Mental Status: She is alert and oriented to person, place, and time.  Psychiatric:        Mood and Affect: Mood normal.        Behavior: Behavior normal.      LABORATORY DATA:  I have reviewed the labs as listed.     Latest Ref Rng & Units 12/13/2021   11:56 AM 11/24/2021   12:05 PM 11/17/2021   11:17 AM  CBC  WBC 4.0 - 10.5 K/uL 170.7  84.4  74.0   Hemoglobin 12.0 - 15.0 g/dL 7.4  7.9  7.9   Hematocrit 36.0 - 46.0 % 22.9  23.9  24.3   Platelets 150 -  400 K/uL 135  149  45       Latest Ref Rng & Units 12/13/2021   11:56 AM 11/17/2021   11:17 AM 11/13/2021   12:59 PM  CMP  Glucose 70 - 99 mg/dL 123  105  99   BUN 8 - 23 mg/dL 41  27  12   Creatinine 0.44 - 1.00 mg/dL 2.25  1.97  1.42   Sodium 135 - 145 mmol/L 138  138  139   Potassium 3.5 - 5.1 mmol/L 4.0  4.2  3.9   Chloride 98 - 111 mmol/L 109  111  112   CO2 22 - 32 mmol/L _0 Calcium 8.9 - 10.3 mg/dL 8.0  8.2  8.1   Total Protein 6.5 - 8.1 g/dL 7.6  7.0    Total Bilirubin 0.3 - 1.2 mg/dL 0.6  0.5    Alkaline Phos 38 - 126 U/L 166  200    AST 15 - 41 U/L 36  49    ALT 0 - 44 U/L 19  41      DIAGNOSTIC IMAGING:  I have independently reviewed the scans and discussed with the patient. DG HIP UNILAT W OR W/O PELVIS 2-3 VIEWS RIGHT  Result Date: 12/12/2021 CLINICAL DATA:  Fall with right lateral hip pain EXAM: DG HIP (WITH OR WITHOUT PELVIS) 2-3V RIGHT COMPARISON:  Radiographs 09/01/2021 FINDINGS: Right THA. No radiographic evidence of loosening. No acute fracture or dislocation. IMPRESSION: No evidence of fracture.  Right  THA. Electronically Signed   By: Placido Sou M.D.   On: 12/12/2021 21:51   [place] RT LOWER EXTREM LTD SOFT TISSUE NON VASCULAR  Result Date: 12/07/2021 CLINICAL DATA:  Subcutaneous masses/nodules RIGHT lower extremity EXAM: ULTRASOUND RIGHT LOWER EXTREMITY LIMITED ULTRASOUND LEFT LOWER EXTREMITY LIMITED TECHNIQUE: Ultrasound examination of the lower extremity soft tissues was performed in the area of clinical concern. COMPARISON:  None Available. FINDINGS: At the sites of palpable/clinical concern in BILATERAL lower extremities, no focal sonographic abnormalities are identified. No subcutaneous nodule or cyst is seen. No abnormal fluid collections, mass or calcification. IMPRESSION: Negative ultrasound of the palpable sites of clinical concern in the lower extremities bilaterally. Electronically Signed   By: Lavonia Dana M.D.   On: 12/07/2021 16:45   [place] LT LOWER EXTREM LTD SOFT TISSUE NON VASCULAR  Result Date: 12/07/2021 CLINICAL DATA:  Subcutaneous masses/nodules RIGHT lower extremity EXAM: ULTRASOUND RIGHT LOWER EXTREMITY LIMITED ULTRASOUND LEFT LOWER EXTREMITY LIMITED TECHNIQUE: Ultrasound examination of the lower extremity soft tissues was performed in the area of clinical concern. COMPARISON:  None Available. FINDINGS: At the sites of palpable/clinical concern in BILATERAL lower extremities, no focal sonographic abnormalities are identified. No subcutaneous nodule or cyst is seen. No abnormal fluid collections, mass or calcification. IMPRESSION: Negative ultrasound of the palpable sites of clinical concern in the lower extremities bilaterally. Electronically Signed   By: Lavonia Dana M.D.   On: 12/07/2021 16:45   DG Tibia/Fibula Right  Result Date: 11/26/2021 CLINICAL DATA:  Bone pain in the right-greater-than-left forelegs EXAM: RIGHT TIBIA AND FIBULA - 2 VIEW; LEFT TIBIA AND FIBULA - 2 VIEW COMPARISON:  None Available. FINDINGS: Left tibia fibula AP Lat only: The bone mineralization is  osteopenic. There is no evidence of fracture or other focal bone lesions. Soft tissues are unremarkable. Right tibia fibula series AP Lat only: The bone mineralization is osteopenic. There is no evidence of fracture or other focal bone lesions. Soft tissues are unremarkable.  IMPRESSION: Bilateral osteopenia, without evidence of fractures or other focal bone lesions. Electronically Signed   By: Telford Nab M.D.   On: 11/26/2021 22:34   DG Tibia/Fibula Left  Result Date: 11/26/2021 CLINICAL DATA:  Bone pain in the right-greater-than-left forelegs EXAM: RIGHT TIBIA AND FIBULA - 2 VIEW; LEFT TIBIA AND FIBULA - 2 VIEW COMPARISON:  None Available. FINDINGS: Left tibia fibula AP Lat only: The bone mineralization is osteopenic. There is no evidence of fracture or other focal bone lesions. Soft tissues are unremarkable. Right tibia fibula series AP Lat only: The bone mineralization is osteopenic. There is no evidence of fracture or other focal bone lesions. Soft tissues are unremarkable. IMPRESSION: Bilateral osteopenia, without evidence of fractures or other focal bone lesions. Electronically Signed   By: Telford Nab M.D.   On: 11/26/2021 22:34     ASSESSMENT:  Leukocytosis with left shift: - CBC on 11/06/2020 with white count 75.6, differential 59% neutrophils, 12% monocytes, 4% lymphocytes, 1 percentage of eosinophils and basophils, 6% band neutrophils, 12% myelocytes, 1% promyelocytes, 29% blasts. - Pathologist review of blood smear reported as leukoerythroblastic reaction. - 25 pound weight loss in the last 6 months, unintentional.  Decreased appetite.  Reports fatigue for the last few months.  Reports night sweats x3 in the last 1 month. - Bone marrow biopsy on 11/18/2020 consistent with granulocytic proliferation with differential including CMML versus myeloproliferative disorder. - BCR/ABL was negative. 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 - NGS myeloid panel shows  mutations in ASXL1, MPL, TET2, EZH2 mutations. - Spleen ultrasound on 12/16/2020 shows mildly enlarged measuring 12.4 x 9.6 x 7 cm with volume of 435 cc. - PDGFR alpha, beta and FGFR 1 was negative. - She was evaluated by Dr. Florene Glen at [facility].  Slides were reviewed at [facility] hematopathology.  They thought it was less likely CMML and more likely MPL positive myeloproliferative neoplasm.  Dr. Florene Glen has recommended initiate Jakafi. - Ruxolitinib 10 mg twice daily started on 02/16/2021.  Dose increased to 15 mg twice daily on 05/18/2021. - CTAP on 04/07/2021: Hepatomegaly and splenomegaly measuring 14.3 cm.  No other pathology.  No spleen infarcts. -Ruxolitinib dose increased to 15 mg twice daily on 09/20/2021. - Ultrasound spleen (09/21/2021): 13.6 cm with volume 532 mL.  This is slightly improved from previous volume. - Bone marrow biopsy (09/09/2021): Material is limited but overall features consistent with myeloid neoplasm.  No increase in blasts.  Erythroid precursors appear decreased but megakaryocytes are variably evident with clusters and small forms.  Reticulin's shows variable increase in reticulin fibers including areas with prominent increase.  Chromosome analysis was normal.  No significant changes at this time.  I have recommended continuing Jakafi 15 mg twice daily.  She reports that she is seeking a second opinion at [facility].  2.  Social/family history: - She lives at home and is able to do all her ADLs and IADLs although she is getting tired lately.  She reports quitting smoking 6 months ago and smoked 1 pack/week for 43 years. - She believes that her mother had some kind of leukemia.  No other malignancies.   PLAN:  MPL positive myeloproliferative neoplasm: - Hospitalized from 11/10/2021 through 11/13/2021 with cough and shortness of breath.  Sputum cultures grew Pseudomonas and stenotrophomonas.  She received IV antibiotics and oral Bactrim until mid October. - She reported  urinary frequency.  No dysuria.  Recommend checking UA. - [name] was held from 11/11/2021 and  was started back on 12/06/2021. - Reviewed labs today which showed white count elevated at 170.  Hemoglobin is 7.4 and platelet count 135.  Differential shows predominantly neutrophils and monocytes.  3% blasts were seen. - She will receive 1 unit PRBC tomorrow. - We will reevaluate her in 3 weeks with repeat blood counts. - Recommend follow-up with Dr. Florene Glen at [facility].  2.  Decreased appetite: - Weight has been stable since discharge.  3.  CKD: - Baseline creatinine between 1.3-1.5.  Today creatinine is 2.5. - She will receive final mL of normal saline over 1 hour. - I have recommended her to discontinue losartan.  4.  Hypokalemia: - Continue potassium supplements.  Potassium is normal today.  5.  Generalized itching: - She reported generalized itching while she has been off of ruxolitinib. - Ruxolitinib was started back on 12/06/2021.    Orders placed this encounter:  No orders of the defined types were placed in this encounter.     Derek Jack, MD [place] [phone]

## 2021-12-13 NOTE — Progress Notes (Signed)
Per MD, will order one unit of blood for transfusion on 12-14-21

## 2021-12-14 ENCOUNTER — Inpatient Hospital Stay: Payer: Medicare Other

## 2021-12-14 DIAGNOSIS — M8580 Other specified disorders of bone density and structure, unspecified site: Secondary | ICD-10-CM | POA: Diagnosis not present

## 2021-12-14 DIAGNOSIS — D649 Anemia, unspecified: Secondary | ICD-10-CM

## 2021-12-14 DIAGNOSIS — L299 Pruritus, unspecified: Secondary | ICD-10-CM | POA: Diagnosis not present

## 2021-12-14 DIAGNOSIS — D471 Chronic myeloproliferative disease: Secondary | ICD-10-CM

## 2021-12-14 DIAGNOSIS — D72829 Elevated white blood cell count, unspecified: Secondary | ICD-10-CM | POA: Diagnosis not present

## 2021-12-14 DIAGNOSIS — R300 Dysuria: Secondary | ICD-10-CM

## 2021-12-14 DIAGNOSIS — E876 Hypokalemia: Secondary | ICD-10-CM | POA: Diagnosis not present

## 2021-12-14 DIAGNOSIS — N183 Chronic kidney disease, stage 3 unspecified: Secondary | ICD-10-CM | POA: Diagnosis not present

## 2021-12-14 LAB — URINALYSIS, ROUTINE W REFLEX MICROSCOPIC
Bacteria, UA: NONE SEEN
Bilirubin Urine: NEGATIVE
Glucose, UA: NEGATIVE mg/dL
Hgb urine dipstick: NEGATIVE
Ketones, ur: NEGATIVE mg/dL
Nitrite: NEGATIVE
Protein, ur: 30 mg/dL — AB
Specific Gravity, Urine: 1.013 (ref 1.005–1.030)
pH: 5 (ref 5.0–8.0)

## 2021-12-14 MED ORDER — SODIUM CHLORIDE 0.9% IV SOLUTION
250.0000 mL | Freq: Once | INTRAVENOUS | Status: AC
  Administered 2021-12-14: 250 mL via INTRAVENOUS

## 2021-12-14 MED ORDER — ACETAMINOPHEN 325 MG PO TABS
650.0000 mg | ORAL_TABLET | Freq: Once | ORAL | Status: AC
  Administered 2021-12-14: 650 mg via ORAL
  Filled 2021-12-14: qty 2

## 2021-12-14 MED ORDER — DIPHENHYDRAMINE HCL 25 MG PO CAPS
25.0000 mg | ORAL_CAPSULE | Freq: Once | ORAL | Status: AC
  Administered 2021-12-14: 25 mg via ORAL
  Filled 2021-12-14: qty 1

## 2021-12-14 MED ORDER — HEPARIN SOD (PORK) LOCK FLUSH 100 UNIT/ML IV SOLN
500.0000 [IU] | Freq: Every day | INTRAVENOUS | Status: AC | PRN
  Administered 2021-12-14: 500 [IU]

## 2021-12-14 MED ORDER — HEPARIN SOD (PORK) LOCK FLUSH 100 UNIT/ML IV SOLN: 500.0000 [IU] | Freq: Every day | INTRAVENOUS | Status: DC | PRN

## 2021-12-14 MED ORDER — LANREOTIDE ACETATE 120 MG/0.5ML ~~LOC~~ SOLN
SUBCUTANEOUS | Status: AC
  Filled 2021-12-14: qty 120

## 2021-12-14 MED ORDER — SODIUM CHLORIDE 0.9% FLUSH
10.0000 mL | INTRAVENOUS | Status: AC | PRN
  Administered 2021-12-14: 10 mL

## 2021-12-14 NOTE — Patient Instructions (Signed)
[facility]  Discharge Instructions: Thank you for choosing [facility] to provide your oncology and hematology care.  If you have a lab appointment with the [facility], please come in thru the Main Entrance and check in at the main information desk.  Wear comfortable clothing and clothing appropriate for easy access to any Portacath or PICC line.   We strive to give you quality time with your provider. You may need to reschedule your appointment if you arrive late (15 or more minutes).  Arriving late affects you and other patients whose appointments are after yours.  Also, if you miss three or more appointments without notifying the office, you may be dismissed from the clinic at the provider's discretion.      For prescription refill requests, have your pharmacy contact our office and allow 72 hours for refills to be completed.    Today you received the following chemotherapy and/or immunotherapy agents Blood      To help prevent nausea and vomiting after your treatment, we encourage you to take your nausea medication as directed.  BELOW ARE SYMPTOMS THAT SHOULD BE REPORTED IMMEDIATELY: *FEVER GREATER THAN 100.4 F (38 C) OR HIGHER *CHILLS OR SWEATING *NAUSEA AND VOMITING THAT IS NOT CONTROLLED WITH YOUR NAUSEA MEDICATION *UNUSUAL SHORTNESS OF BREATH *UNUSUAL BRUISING OR BLEEDING *URINARY PROBLEMS (pain or burning when urinating, or frequent urination) *BOWEL PROBLEMS (unusual diarrhea, constipation, pain near the anus) TENDERNESS IN MOUTH AND THROAT WITH OR WITHOUT PRESENCE OF ULCERS (sore throat, sores in mouth, or a toothache) UNUSUAL RASH, SWELLING OR PAIN  UNUSUAL VAGINAL DISCHARGE OR ITCHING   Items with * indicate a potential emergency and should be followed up as soon as possible or go to the Emergency Department if any problems should occur.  Please show the CHEMOTHERAPY ALERT CARD or IMMUNOTHERAPY ALERT CARD at check-in to the Emergency  Department and triage nurse.  Should you have questions after your visit or need to cancel or reschedule your appointment, please contact [facility] [phone]  and follow the prompts.  Office hours are 8:00 a.m. to 4:30 p.m. Monday - Friday. Please note that voicemails left after 4:00 p.m. may not be returned until the following business day.  We are closed weekends and major holidays. You have access to a nurse at all times for urgent questions. Please call the main number to the clinic [phone] and follow the prompts.  For any non-urgent questions, you may also contact your provider using MyChart. We now offer e-Visits for anyone 82 and older to request care online for non-urgent symptoms. For details visit [email or web address].   Also download the MyChart app! Go to the app store, search "MyChart", open the app, select [facility], and log in with your MyChart username and password.  Masks are optional in the cancer centers. If you would like for your care team to wear a mask while they are taking care of you, please let them know. You may have one support person who is at least 76 years old accompany you for your appointments.

## 2021-12-14 NOTE — Progress Notes (Signed)
Patient presents today for one unit of packed red blood cells per providers order.  Vital signs WNL.  Patient has no new complaints at this time.    Stable during transfusion without adverse affects.  Vital signs stable.  No complaints at this time.  Discharge from clinic ambulatory in stable condition.  Alert and oriented X 3.  Follow up with [facility] as scheduled.

## 2021-12-15 LAB — TYPE AND SCREEN
ABO/RH(D): O POS
Antibody Screen: POSITIVE
DAT, IgG: NEGATIVE
Donor AG Type: NEGATIVE
Unit division: 0

## 2021-12-15 LAB — URINE CULTURE: Culture: NO GROWTH

## 2021-12-15 LAB — BPAM RBC
Blood Product Expiration Date: 202311142359
ISSUE DATE / TIME: 202311071040
Unit Type and Rh: 9500

## 2021-12-17 ENCOUNTER — Ambulatory Visit (INDEPENDENT_AMBULATORY_CARE_PROVIDER_SITE_OTHER): Payer: Medicare Other | Admitting: Internal Medicine

## 2021-12-17 ENCOUNTER — Encounter: Payer: Self-pay | Admitting: Internal Medicine

## 2021-12-17 VITALS — BP 124/82 | HR 82 | Temp 98.1°F | Ht 64.0 in | Wt 126.2 lb

## 2021-12-17 DIAGNOSIS — J479 Bronchiectasis, uncomplicated: Secondary | ICD-10-CM | POA: Diagnosis not present

## 2021-12-17 NOTE — Patient Instructions (Addendum)
For cough/ wheeze/ short of breath ok to use Symbicort up to 2 pffs every 12 hours x one week to get the full benefit then taper off as tolerated   Continue to use the flutter valve as much as your can   Follow up in pulmonary clinic is as needed

## 2021-12-17 NOTE — Progress Notes (Unsigned)
Renee Waters, female    DOB: 11/22/1945    MRN: [id number]   Brief patient profile:  76   yobf  quit smoking 2004 with dx of myelofibrosis referred to pulmonary clinic in [place]  11/01/2021 by Renee Renee Waters  for cough/sob since August 2023     History of Present Illness  11/01/2021  Pulmonary/ 1st office eval/ Renee Waters / [facility]  Chief Complaint  Patient presents with   Consult    Cough for several months. Urgent referral from Renee. Moshe Waters Waters   "Myeloprolifertive malignancy with markedly increased WBC."    Dyspnea:  slower than others but can do [employer] walking = MMRC2 = can't walk a nl pace on a flat grade s sob but does fine slow and flat   Cough: lots of mucus in am greyand thick / assoc nasal congestion also worse in am  Sleep: 30 degrees  SABA use: advair x 1 week not helping, does not have saba Rec Symbicort  80 Take 2 puffs first thing in am and then another 2 puffs about 12 hours later.  Work on inhaler technique:   Change protonix to 40 mg Take 30- 60 min before your first and last meals of the day  Prednisone 10 mg take  4 each am x 2 days,   2 each am x 2 days,  1 each am x 2 days and stop  Augmentin 875 mg take one pill twice daily  X 10 days For cough > mucinex DM 1200 every 12 hours and supplement with oxyIR  '5mg'$  up to 1-2 hours every 4 hours as needed      Admit date: 11/10/2021 Discharge date: 11/13/2021   Admitted From: HOME Disposition:  HOME   Recommendations for Outpatient Follow-up:  Follow up with Waters in 1-2 weeks Please obtain BMP/CBC in one week your next doctors visit.  Home with IV Meropenem and Bactrim PO BID to complete total 14 day course. IV Abx to be infused via her chemo port. [place] RN arranged to help manage her port at home. Family educated for medication infusion.  Hold Jafaki during active infection. Follow up outpatient Onc in the next 7-10 days for further instructions.  Follow up outpatient Pulm per their service.    Home Health:  RN Equipment/Devices: IV Abx at home Discharge Condition: Stable CODE STATUS: Full Diet recommendation: 2g Na.    Brief/Interim Summary: 76 year old with history of lupus, HTN, GERD, chronic pain, insomnia, depression, anxiety, CKD stage IIIb, myeloproliferative neoplasm admitted for chronic cough and shortness of breath.  Cough has been ongoing for 4 weeks treated with Augmentin but developed diarrhea.  Cultures from 9/19 grew Pseudomonas and stenotrophomonas.  CT chest showed bilateral cylindrical bronchiectasis, emphysema, bronchomalacia.  Started on IV meropenem and p.o. Bactrim.  Pulmonary team was consulted.  COVID and flu were negative.  Hemoglobin slowly drifted down therefore 1 unit PRBC transfusion.  Hemoglobin appropriately responded, renal function slowly trending up.  Patient was seen by Pulm, over the course of 3 days her symptoms improved. It was recommended she stays on IV Meropenem and PO Bactrim for total of 2 weeks, therefore arrangements were made. Due to low Hb, she did get PRBC transfusion in the hospital as she has in the past outpatient. She developed mild AKI during hospitalization, but improved with fluids.  She is stable for dc with recs as above.     Assessment & Plan:  Principal Problem:   Pneumonia   Depression   Labile  hypertension   GERD (gastroesophageal reflux disease)   Chronic midline low back pain with sciatica   Insomnia secondary to depression with anxiety   Systemic lupus (HCC)   Anemia, normocytic normochromic   CKD (chronic kidney disease) stage 3, GFR 30-59 ml/min (HCC)   Anxiety   MPN (myeloproliferative neoplasm) (HCC)   Bronchiectasis assoc with possible tracheomalacia   Acute respiratory distress secondary to bronchopneumonia, immunocompromised -Patient has failed outpatient treatment with Augmentin.  Cultures from 9/19 grew Pseudomonas, stenotrophomonas.  ProCal 0.17, BNP 168 - On IV meropenem and p.o. Bactrim to complete total 14 days. Has  Chemo port which can be used. [place] RN to help manage it at home and change needle when approrpaite. Follow up outpatient Pulm - Bronchodilators, I-S/flutter valve - Flu/COVID-negative.  Respiratory panel negative   Diarrhea -Due to recent antibiotic use.  But states this is resolved now   Labile hypertension -Currently on metoprolol 25 mg twice daily.  -   AKI CKD 3B secondary to lupus; improved.  -Baseline creatinine 1.2.  Not on any lupus medications?  peaked at 1.5, no improved with lfluids. Advised oral hydration at home.      Anemia Thrombocytopenia Myeloproliferative neoplasm -one biopsy consistent with granulocytic proliferation: CMML versus myeloproliferative disorder; with further evaluation more suggestive of myeloproliferative neoplasm.  Known splenomegaly.  Jakafi on hold due to active infection.  Follows with outpatient Renee. Del[facility] Waters -Hemoglobin drifted down, s/p 1 unit PRBC.  Gets periodic transfusion outpatient. Hb stable. Follow up outpatient. Renee Waters can be resumed when ok with Onc outpatient - Continue allopurinol, oxycodone   GERD -Protonix twice daily   Insomnia, depression, anxiety -Celexa, Remeron       Chronic pain -Continue home oxycodone.        11/16/2021  f/u ov/[place] office/Renee Waters re: bronchiectasis  maint on no resp rx   Chief Complaint  Patient presents with   Follow-up    Needs nebulizer medication was sent home from hospital with nebulizer but no medication.  Renee Waters admission 10/4-10/7  Dyspnea:  no outdoor walking but ok with slow adls at home Cough: feels sensation of mucus in throat but no longer coughing anything upSleeping: high pillow  SABA use: none now  02:  none  Rec For cough / congestion >  mucinex dm 1200 mg every 12 hours as needed and use the flutter valve as much as possible  Plan A = Automatic = Always=    Symbicort 80 Take 2 puffs first thing in am and then another 2 puffs about 12 hours later.   Work on inhaler  technique:   Plan B = Backup (to supplement plan A, not to replace it) Only use your albuterol inhaler as a rescue medication  Plan C = Crisis (instead of Plan B but only if Plan B stops working) - only use your albuterol nebulizer if you first try Plan B Finish all antibiotics   Please schedule a follow up office visit in 4 weeks with all respiratory devices/ medications /inhalers/ solutions in hand    12/17/2021  f/u ov/[place] office/Chavon Lucarelli re: bronchiectasis  maint on symb80 2bid   Chief Complaint  Patient presents with   Follow-up    Feels better since last ov  Dyspnea:  very sedentary  slowed by spine more so than sob / sats 94% with walking RA Cough: resolved / using flutter valve prn  Sleeping: flat bed regular pillow  SABA use: none at all  02: has it not using  No obvious day to day or daytime variability or assoc excess/ purulent sputum or mucus plugs or hemoptysis or cp or chest tightness, subjective wheeze or overt sinus or hb symptoms.   Sleeping  without nocturnal  or early am exacerbation  of respiratory  c/o's or need for noct saba. Also denies any obvious fluctuation of symptoms with weather or environmental changes or other aggravating or alleviating factors except as outlined above   No unusual exposure hx or h/o childhood pna/ asthma or knowledge of premature birth.  Current Allergies, Complete Past Medical History, Past Surgical History, Family History, and Social History were reviewed in [employer] record.  ROS  The following are not active complaints unless bolded Hoarseness, sore throat, dysphagia, dental problems, itching, sneezing,  nasal congestion or discharge of excess mucus or purulent secretions, ear ache,   fever, chills, sweats, unintended wt loss or wt gain, classically pleuritic or exertional cp,  orthopnea pnd or arm/hand swelling  or leg swelling, presyncope, palpitations, abdominal pain, anorexia, nausea, vomiting,  diarrhea  or change in bowel habits or change in bladder habits, change in stools or change in urine, dysuria, hematuria,  rash, arthralgias, visual complaints, headache, numbness, weakness or ataxia or problems with walking or coordination,  change in mood or  memory.        Current Meds  Medication Sig   acyclovir (ZOVIRAX) 800 MG tablet Take 1 tablet (800 mg total) by mouth 5 (five) times daily.   acyclovir ointment (ZOVIRAX) 5 % Apply 1 application. topically every 3 (three) hours.   albuterol (PROVENTIL) (2.5 MG/3ML) 0.083% nebulizer solution Take 3 mLs (2.5 mg total) by nebulization every 4 (four) hours as needed for wheezing or shortness of breath.   albuterol (VENTOLIN HFA) 108 (90 Base) MCG/ACT inhaler Inhale 2 puffs into the lungs every 4 (four) hours as needed for wheezing or shortness of breath.   allopurinol (ZYLOPRIM) 300 MG tablet Take 1 tablet (300 mg total) by mouth daily.   budesonide-formoterol (SYMBICORT) 80-4.5 MCG/ACT inhaler Take 2 puffs first thing in am and then another 2 puffs about 12 hours later.   cetirizine (ZYRTEC) 10 MG tablet Take 10 mg by mouth daily.   citalopram (CELEXA) 20 MG tablet Take 1 tablet by mouth once daily   clotrimazole (MYCELEX) 10 MG troche Take one tablet by mouth four times daily   EPINEPHRINE 0.3 mg/0.3 mL IJ SOAJ injection INJECT 0.3 MLS INTO MUSCLE ONCE AS NEEDED (Patient taking differently: Inject 0.3 mg into the muscle as needed for anaphylaxis.)   furosemide (LASIX) 20 MG tablet Take 1 tablet (20 mg total) by mouth daily as needed.   gabapentin (NEURONTIN) 300 MG capsule Take one capsule by mouth once daily, as needed, for pain (Patient taking differently: Take 300 mg by mouth daily as needed (Pain).)   lidocaine-prilocaine (EMLA) cream Apply 1 Application topically as needed (Access to port and pain).   metoprolol tartrate (LOPRESSOR) 25 MG tablet TAKE 1 & 1/2 (ONE & ONE-HALF) TABLETS (37.5 MG) BY MOUTH TWICE DAILY   mirtazapine (REMERON)  15 MG tablet Take 1 tablet (15 mg total) by mouth at bedtime.   Multiple Vitamin (MULTIVITAMIN WITH MINERALS) TABS tablet Take 1 tablet by mouth every other day.   oxyCODONE (OXY IR/ROXICODONE) 5 MG immediate release tablet Take 5 mg by mouth every 4 (four) hours as needed for moderate pain, severe pain or breakthrough pain.   pantoprazole (PROTONIX) 40 MG tablet Take 1 tablet (40 mg total)  by mouth 2 (two) times daily before a meal.   potassium chloride (KLOR-CON) 10 MEQ tablet Take 1 tablet by mouth once daily   promethazine-dextromethorphan (PROMETHAZINE-DM) 6.25-15 MG/5ML syrup Take half to one  teaspoon twice daily as needed for excessive cough   ruxolitinib phosphate (JAKAFI) 15 MG tablet Take 1 tablet (15 mg total) by mouth 2 (two) times daily.   temazepam (RESTORIL) 30 MG capsule Take 1 capsule (30 mg total) by mouth at bedtime as needed for sleep.   tiZANidine (ZANAFLEX) 4 MG tablet Take 2 tablets (8 mg total) by mouth 3 (three) times daily as needed (spasms). (Patient taking differently: Take 4-8 mg by mouth 3 (three) times daily as needed for muscle spasms.)                        Past Medical History:  Diagnosis Date   Acute cholangitis    Allergy    Anemia    Anxiety    Arthritis    [employer] 03/18/2020   Barrett's esophagus    Cataract    Chronic back pain    Chronic neck pain    CKD (chronic kidney disease) stage 3, GFR 30-59 ml/min (HCC) 10/29/2020   Depression    Genital herpes    GERD (gastroesophageal reflux disease)    H/O degenerative disc disease    History of blood transfusion    Hypertension    Insomnia    Lupus (systemic lupus erythematosus) ([place])    Neuromuscular disorder ([place])    Osteoarthritis    S/P colonoscopy June 2005   normal, no polyps   S/P endoscopy June 2005, Oct 2009   2005: short-segment Barrett's, 2009: short-segment Barrett's   Upper respiratory tract infection 07/09/2020   UTI (lower urinary tract infection) 11/2012        Objective:     12/17/2021     126   11/16/21 133 lb 12.8 oz (60.7 kg)  11/11/21 126 lb 8.7 oz (57.4 kg)  11/10/21 126 lb 9.6 oz (57.4 kg)    Vital signs reviewed  12/17/2021  - Note at rest 02 sats  97% on RA   General appearance:    amb bf nad       HEENT : Oropharynx  clear/ edentulous   Nasal turbinates nl    NECK :  without  apparent JVD/ palpable Nodes/TM    LUNGS: no acc muscle use,  Min barrel  contour chest wall with bilateral  slightly decreased bs s audible wheeze and  without cough on insp or exp maneuvers and min  Hyperresonant  to  percussion bilaterally    CV:  RRR  no s3 or murmur or increase in P2, and no edema   ABD:  soft and nontender with pos end  insp Hoover's  in the supine position.  No bruits or organomegaly appreciated   MS:  Nl gait/ ext warm without deformities Or obvious joint restrictions  calf tenderness, cyanosis or clubbing     SKIN: warm and dry without lesions    NEURO:  alert, approp, nl sensorium with  no motor or cerebellar deficits apparent.                     Assessment

## 2021-12-18 ENCOUNTER — Encounter: Payer: Self-pay | Admitting: Internal Medicine

## 2021-12-18 NOTE — Assessment & Plan Note (Signed)
See HRCT  11/10/21 = bilateral cylindrical bronchiectasis in setting of MP disorder  - sputum   cultures from 10/26/21  grew Pseudomonas and stenotrophomonas > rx 14 days bactrim/ meropenem  - 11/16/2021 added flutter valve/ neb albuterol (as plan C on action plan)  - 12/17/2021  After extensive coaching inhaler device,  effectiveness =    80% > continue symb 80 2bid / approp saba and flutter valve   Doing great on relatively simple rx for now - if worse cough/ congestion not responding to max mucinex and flutter valve could add VEST rx   For purulent sputum ideally get am culture and if not convenient or expedient place back on bactrim x 10 day courses empirically.  She has so many providers would like to do pulmonary f/u prn which is fine with me as Dr Moshe Cipro in our office is her PCP         Each maintenance medication was reviewed in detail including emphasizing most importantly the difference between maintenance and prns and under what circumstances the prns are to be triggered using an action plan format where appropriate.  Total time for H and P, chart review, counseling, reviewing hfa/neb/flutter/ 02  device(s) and generating customized AVS unique to this office visit / same day charting = 25 min

## 2021-12-20 ENCOUNTER — Inpatient Hospital Stay: Payer: Medicare Other

## 2021-12-20 DIAGNOSIS — L299 Pruritus, unspecified: Secondary | ICD-10-CM | POA: Diagnosis not present

## 2021-12-20 DIAGNOSIS — M8580 Other specified disorders of bone density and structure, unspecified site: Secondary | ICD-10-CM | POA: Diagnosis not present

## 2021-12-20 DIAGNOSIS — D471 Chronic myeloproliferative disease: Secondary | ICD-10-CM

## 2021-12-20 DIAGNOSIS — D72829 Elevated white blood cell count, unspecified: Secondary | ICD-10-CM | POA: Diagnosis not present

## 2021-12-20 DIAGNOSIS — N183 Chronic kidney disease, stage 3 unspecified: Secondary | ICD-10-CM | POA: Diagnosis not present

## 2021-12-20 DIAGNOSIS — E876 Hypokalemia: Secondary | ICD-10-CM | POA: Diagnosis not present

## 2021-12-20 LAB — CBC WITH DIFFERENTIAL/PLATELET
Abs Immature Granulocytes: 19.3 10*3/uL — ABNORMAL HIGH (ref 0.00–0.07)
Band Neutrophils: 7 %
Basophils Absolute: 0 10*3/uL (ref 0.0–0.1)
Basophils Relative: 0 %
Eosinophils Absolute: 0 10*3/uL (ref 0.0–0.5)
Eosinophils Relative: 0 %
HCT: 24.4 % — ABNORMAL LOW (ref 36.0–46.0)
Hemoglobin: 7.7 g/dL — ABNORMAL LOW (ref 12.0–15.0)
Lymphocytes Relative: 7 %
Lymphs Abs: 7.9 10*3/uL — ABNORMAL HIGH (ref 0.7–4.0)
MCH: 28.8 pg (ref 26.0–34.0)
MCHC: 31.6 g/dL (ref 30.0–36.0)
MCV: 91.4 fL (ref 80.0–100.0)
Metamyelocytes Relative: 7 %
Monocytes Absolute: 7.9 10*3/uL — ABNORMAL HIGH (ref 0.1–1.0)
Monocytes Relative: 7 %
Neutro Abs: 78.2 10*3/uL — ABNORMAL HIGH (ref 1.7–7.7)
Neutrophils Relative %: 62 %
Platelets: 103 10*3/uL — ABNORMAL LOW (ref 150–400)
Promyelocytes Relative: 10 %
RBC: 2.67 MIL/uL — ABNORMAL LOW (ref 3.87–5.11)
RDW: 25.7 % — ABNORMAL HIGH (ref 11.5–15.5)
WBC: 113.3 10*3/uL (ref 4.0–10.5)
nRBC: 2.5 % — ABNORMAL HIGH (ref 0.0–0.2)
nRBC: 3 /100 WBC — ABNORMAL HIGH

## 2021-12-20 LAB — PREPARE RBC (CROSSMATCH)

## 2021-12-20 MED ORDER — HEPARIN SOD (PORK) LOCK FLUSH 100 UNIT/ML IV SOLN
500.0000 [IU] | Freq: Once | INTRAVENOUS | Status: AC
  Administered 2021-12-20: 500 [IU] via INTRAVENOUS

## 2021-12-20 MED ORDER — SODIUM CHLORIDE 0.9% FLUSH
10.0000 mL | INTRAVENOUS | Status: DC | PRN
  Administered 2021-12-20: 10 mL via INTRAVENOUS

## 2021-12-20 NOTE — Progress Notes (Signed)
CRITICAL VALUE ALERT Critical value received:  WBC 113.3.  Date of notification:  12-20-2021 Time of notification: 12:09 pm.  Critical value read back:  Yes.   Nurse who received alert:  B. Leathie Weich RN.  MD notified time and response:  [name] @ 12:27 pm.

## 2021-12-20 NOTE — Patient Instructions (Signed)
[facility]  Discharge Instructions: Thank you for choosing [facility] to provide your oncology and hematology care.  If you have a lab appointment with the [facility], please come in thru the Main Entrance and check in at the main information desk.  Wear comfortable clothing and clothing appropriate for easy access to any Portacath or PICC line.   We strive to give you quality time with your provider. You may need to reschedule your appointment if you arrive late (15 or more minutes).  Arriving late affects you and other patients whose appointments are after yours.  Also, if you miss three or more appointments without notifying the office, you may be dismissed from the clinic at the provider's discretion.      For prescription refill requests, have your pharmacy contact our office and allow 72 hours for refills to be completed.   Today we accessed port for labs and poss blood transfusion   To help prevent nausea and vomiting after your treatment, we encourage you to take your nausea medication as directed.  BELOW ARE SYMPTOMS THAT SHOULD BE REPORTED IMMEDIATELY: *FEVER GREATER THAN 100.4 F (38 C) OR HIGHER *CHILLS OR SWEATING *NAUSEA AND VOMITING THAT IS NOT CONTROLLED WITH YOUR NAUSEA MEDICATION *UNUSUAL SHORTNESS OF BREATH *UNUSUAL BRUISING OR BLEEDING *URINARY PROBLEMS (pain or burning when urinating, or frequent urination) *BOWEL PROBLEMS (unusual diarrhea, constipation, pain near the anus) TENDERNESS IN MOUTH AND THROAT WITH OR WITHOUT PRESENCE OF ULCERS (sore throat, sores in mouth, or a toothache) UNUSUAL RASH, SWELLING OR PAIN  UNUSUAL VAGINAL DISCHARGE OR ITCHING   Items with * indicate a potential emergency and should be followed up as soon as possible or go to the Emergency Department if any problems should occur.  Please show the CHEMOTHERAPY ALERT CARD or IMMUNOTHERAPY ALERT CARD at check-in to the Emergency Department and triage  nurse.  Should you have questions after your visit or need to cancel or reschedule your appointment, please contact [facility] [phone]  and follow the prompts.  Office hours are 8:00 a.m. to 4:30 p.m. Monday - Friday. Please note that voicemails left after 4:00 p.m. may not be returned until the following business day.  We are closed weekends and major holidays. You have access to a nurse at all times for urgent questions. Please call the main number to the clinic [phone] and follow the prompts.  For any non-urgent questions, you may also contact your provider using MyChart. We now offer e-Visits for anyone 45 and older to request care online for non-urgent symptoms. For details visit [email or web address].   Also download the MyChart app! Go to the app store, search "MyChart", open the app, select [facility], and log in with your MyChart username and password.  Masks are optional in the cancer centers. If you would like for your care team to wear a mask while they are taking care of you, please let them know. You may have one support person who is at least 77 years old accompany you for your appointments.

## 2021-12-20 NOTE — Progress Notes (Signed)
Per [name] orders through secure chat. Infuse 1 unit of PRBC's . Reported HGB 7.7. Scheduling notified and patient aware to keep appointment tomorrow.

## 2021-12-20 NOTE — Addendum Note (Signed)
Addended by: Benjiman Core D on: 12/20/2021 12:40 PM   Modules accepted: Orders

## 2021-12-20 NOTE — Progress Notes (Signed)
[name] presented for Portacath access and flush. Proper placement of portacath confirmed by CXR. Portacath located right chest wall accessed with  H 20 needle. Good blood return present. Portacath flushed with 39m NS and 500U/548mHeparin and needle removed intact. Procedure without incident. Patient tolerated procedure well.  Vitals stable and discharged home from clinic ambulatory. Follow up as scheduled.

## 2021-12-21 ENCOUNTER — Inpatient Hospital Stay: Payer: Medicare Other

## 2021-12-21 DIAGNOSIS — M8580 Other specified disorders of bone density and structure, unspecified site: Secondary | ICD-10-CM | POA: Diagnosis not present

## 2021-12-21 DIAGNOSIS — L299 Pruritus, unspecified: Secondary | ICD-10-CM | POA: Diagnosis not present

## 2021-12-21 DIAGNOSIS — D471 Chronic myeloproliferative disease: Secondary | ICD-10-CM

## 2021-12-21 DIAGNOSIS — N183 Chronic kidney disease, stage 3 unspecified: Secondary | ICD-10-CM | POA: Diagnosis not present

## 2021-12-21 DIAGNOSIS — E876 Hypokalemia: Secondary | ICD-10-CM | POA: Diagnosis not present

## 2021-12-21 DIAGNOSIS — D72829 Elevated white blood cell count, unspecified: Secondary | ICD-10-CM | POA: Diagnosis not present

## 2021-12-21 MED ORDER — ACETAMINOPHEN 325 MG PO TABS
650.0000 mg | ORAL_TABLET | Freq: Once | ORAL | Status: AC
  Administered 2021-12-21: 650 mg via ORAL
  Filled 2021-12-21: qty 2

## 2021-12-21 MED ORDER — SODIUM CHLORIDE 0.9% IV SOLUTION
250.0000 mL | Freq: Once | INTRAVENOUS | Status: AC
  Administered 2021-12-21: 250 mL via INTRAVENOUS

## 2021-12-21 MED ORDER — HEPARIN SOD (PORK) LOCK FLUSH 100 UNIT/ML IV SOLN
500.0000 [IU] | Freq: Every day | INTRAVENOUS | Status: AC | PRN
  Administered 2021-12-21: 500 [IU]

## 2021-12-21 MED ORDER — SODIUM CHLORIDE 0.9% FLUSH
10.0000 mL | INTRAVENOUS | Status: AC | PRN
  Administered 2021-12-21: 10 mL

## 2021-12-21 MED ORDER — DIPHENHYDRAMINE HCL 25 MG PO CAPS
25.0000 mg | ORAL_CAPSULE | Freq: Once | ORAL | Status: AC
  Administered 2021-12-21: 25 mg via ORAL
  Filled 2021-12-21: qty 1

## 2021-12-21 NOTE — Patient Instructions (Signed)
[facility]  Discharge Instructions: Thank you for choosing [facility] to provide your oncology and hematology care.  If you have a lab appointment with the [facility], please come in thru the Main Entrance and check in at the main information desk.  Wear comfortable clothing and clothing appropriate for easy access to any Portacath or PICC line.   We strive to give you quality time with your provider. You may need to reschedule your appointment if you arrive late (15 or more minutes).  Arriving late affects you and other patients whose appointments are after yours.  Also, if you miss three or more appointments without notifying the office, you may be dismissed from the clinic at the provider's discretion.      For prescription refill requests, have your pharmacy contact our office and allow 72 hours for refills to be completed.    Today you received one unit of blood   To help prevent nausea and vomiting after your treatment, we encourage you to take your nausea medication as directed.  BELOW ARE SYMPTOMS THAT SHOULD BE REPORTED IMMEDIATELY: *FEVER GREATER THAN 100.4 F (38 C) OR HIGHER *CHILLS OR SWEATING *NAUSEA AND VOMITING THAT IS NOT CONTROLLED WITH YOUR NAUSEA MEDICATION *UNUSUAL SHORTNESS OF BREATH *UNUSUAL BRUISING OR BLEEDING *URINARY PROBLEMS (pain or burning when urinating, or frequent urination) *BOWEL PROBLEMS (unusual diarrhea, constipation, pain near the anus) TENDERNESS IN MOUTH AND THROAT WITH OR WITHOUT PRESENCE OF ULCERS (sore throat, sores in mouth, or a toothache) UNUSUAL RASH, SWELLING OR PAIN  UNUSUAL VAGINAL DISCHARGE OR ITCHING   Items with * indicate a potential emergency and should be followed up as soon as possible or go to the Emergency Department if any problems should occur.  Please show the CHEMOTHERAPY ALERT CARD or IMMUNOTHERAPY ALERT CARD at check-in to the Emergency Department and triage nurse.  Should you have  questions after your visit or need to cancel or reschedule your appointment, please contact [facility] [phone]  and follow the prompts.  Office hours are 8:00 a.m. to 4:30 p.m. Monday - Friday. Please note that voicemails left after 4:00 p.m. may not be returned until the following business day.  We are closed weekends and major holidays. You have access to a nurse at all times for urgent questions. Please call the main number to the clinic [phone] and follow the prompts.  For any non-urgent questions, you may also contact your provider using MyChart. We now offer e-Visits for anyone 39 and older to request care online for non-urgent symptoms. For details visit [email or web address].   Also download the MyChart app! Go to the app store, search "MyChart", open the app, select [facility], and log in with your MyChart username and password.  Masks are optional in the cancer centers. If you would like for your care team to wear a mask while they are taking care of you, please let them know. You may have one support person who is at least 76 years old accompany you for your appointments.

## 2021-12-21 NOTE — Progress Notes (Unsigned)
One unit of blood given per orders. Patient tolerated it well without problems. Vitals stable and discharged home from clinic via wheelchair. Follow up as scheduled.  

## 2021-12-23 DIAGNOSIS — H35362 Drusen (degenerative) of macula, left eye: Secondary | ICD-10-CM | POA: Diagnosis not present

## 2021-12-23 DIAGNOSIS — H3582 Retinal ischemia: Secondary | ICD-10-CM | POA: Diagnosis not present

## 2021-12-23 DIAGNOSIS — H43813 Vitreous degeneration, bilateral: Secondary | ICD-10-CM | POA: Diagnosis not present

## 2021-12-23 DIAGNOSIS — H35373 Puckering of macula, bilateral: Secondary | ICD-10-CM | POA: Diagnosis not present

## 2021-12-24 DIAGNOSIS — D471 Chronic myeloproliferative disease: Secondary | ICD-10-CM | POA: Diagnosis not present

## 2021-12-24 DIAGNOSIS — G8929 Other chronic pain: Secondary | ICD-10-CM | POA: Diagnosis not present

## 2021-12-24 DIAGNOSIS — R1012 Left upper quadrant pain: Secondary | ICD-10-CM | POA: Diagnosis not present

## 2021-12-24 DIAGNOSIS — K219 Gastro-esophageal reflux disease without esophagitis: Secondary | ICD-10-CM | POA: Diagnosis not present

## 2021-12-24 DIAGNOSIS — R5383 Other fatigue: Secondary | ICD-10-CM | POA: Diagnosis not present

## 2021-12-24 DIAGNOSIS — I959 Hypotension, unspecified: Secondary | ICD-10-CM | POA: Diagnosis not present

## 2021-12-24 DIAGNOSIS — M79661 Pain in right lower leg: Secondary | ICD-10-CM | POA: Diagnosis not present

## 2021-12-24 DIAGNOSIS — E876 Hypokalemia: Secondary | ICD-10-CM | POA: Diagnosis not present

## 2021-12-24 DIAGNOSIS — G4700 Insomnia, unspecified: Secondary | ICD-10-CM | POA: Diagnosis not present

## 2021-12-24 DIAGNOSIS — F32A Depression, unspecified: Secondary | ICD-10-CM | POA: Diagnosis not present

## 2021-12-24 DIAGNOSIS — F419 Anxiety disorder, unspecified: Secondary | ICD-10-CM | POA: Diagnosis not present

## 2021-12-24 DIAGNOSIS — M549 Dorsalgia, unspecified: Secondary | ICD-10-CM | POA: Diagnosis not present

## 2021-12-24 DIAGNOSIS — R630 Anorexia: Secondary | ICD-10-CM | POA: Diagnosis not present

## 2021-12-24 DIAGNOSIS — Z79899 Other long term (current) drug therapy: Secondary | ICD-10-CM | POA: Diagnosis not present

## 2021-12-24 DIAGNOSIS — M25559 Pain in unspecified hip: Secondary | ICD-10-CM | POA: Diagnosis not present

## 2021-12-24 DIAGNOSIS — I129 Hypertensive chronic kidney disease with stage 1 through stage 4 chronic kidney disease, or unspecified chronic kidney disease: Secondary | ICD-10-CM | POA: Diagnosis not present

## 2021-12-24 DIAGNOSIS — N189 Chronic kidney disease, unspecified: Secondary | ICD-10-CM | POA: Diagnosis not present

## 2021-12-24 LAB — TYPE AND SCREEN
ABO/RH(D): O POS
Antibody Screen: POSITIVE
DAT, IgG: NEGATIVE
Donor AG Type: NEGATIVE
Unit division: 0
Unit division: 0

## 2021-12-24 LAB — BPAM RBC
Blood Product Expiration Date: 202312022359
Blood Product Expiration Date: 202312022359
ISSUE DATE / TIME: 202311141038
ISSUE DATE / TIME: 202311141038
Unit Type and Rh: 5100
Unit Type and Rh: 5100

## 2021-12-26 ENCOUNTER — Other Ambulatory Visit: Payer: Self-pay | Admitting: Family Medicine

## 2021-12-27 ENCOUNTER — Inpatient Hospital Stay: Payer: Medicare Other

## 2021-12-27 VITALS — BP 173/76 | HR 71 | Temp 98.9°F | Resp 16

## 2021-12-27 DIAGNOSIS — D72829 Elevated white blood cell count, unspecified: Secondary | ICD-10-CM | POA: Diagnosis not present

## 2021-12-27 DIAGNOSIS — E876 Hypokalemia: Secondary | ICD-10-CM | POA: Diagnosis not present

## 2021-12-27 DIAGNOSIS — Z95828 Presence of other vascular implants and grafts: Secondary | ICD-10-CM

## 2021-12-27 DIAGNOSIS — L299 Pruritus, unspecified: Secondary | ICD-10-CM | POA: Diagnosis not present

## 2021-12-27 DIAGNOSIS — D471 Chronic myeloproliferative disease: Secondary | ICD-10-CM

## 2021-12-27 DIAGNOSIS — M8580 Other specified disorders of bone density and structure, unspecified site: Secondary | ICD-10-CM | POA: Diagnosis not present

## 2021-12-27 DIAGNOSIS — N183 Chronic kidney disease, stage 3 unspecified: Secondary | ICD-10-CM | POA: Diagnosis not present

## 2021-12-27 LAB — CBC WITH DIFFERENTIAL/PLATELET
Abs Immature Granulocytes: 21.1 10*3/uL — ABNORMAL HIGH (ref 0.00–0.07)
Band Neutrophils: 4 %
Basophils Absolute: 0 10*3/uL (ref 0.0–0.1)
Basophils Relative: 0 %
Eosinophils Absolute: 1.1 10*3/uL — ABNORMAL HIGH (ref 0.0–0.5)
Eosinophils Relative: 1 %
HCT: 30.6 % — ABNORMAL LOW (ref 36.0–46.0)
Hemoglobin: 9.7 g/dL — ABNORMAL LOW (ref 12.0–15.0)
Lymphocytes Relative: 7 %
Lymphs Abs: 7.8 10*3/uL — ABNORMAL HIGH (ref 0.7–4.0)
MCH: 29.2 pg (ref 26.0–34.0)
MCHC: 31.7 g/dL (ref 30.0–36.0)
MCV: 92.2 fL (ref 80.0–100.0)
Metamyelocytes Relative: 7 %
Monocytes Absolute: 10 10*3/uL — ABNORMAL HIGH (ref 0.1–1.0)
Monocytes Relative: 9 %
Myelocytes: 2 %
Neutro Abs: 71 10*3/uL — ABNORMAL HIGH (ref 1.7–7.7)
Neutrophils Relative %: 60 %
Platelets: 154 10*3/uL (ref 150–400)
Promyelocytes Relative: 10 %
RBC: 3.32 MIL/uL — ABNORMAL LOW (ref 3.87–5.11)
RDW: 23.8 % — ABNORMAL HIGH (ref 11.5–15.5)
WBC: 110.9 10*3/uL (ref 4.0–10.5)
nRBC: 2.6 % — ABNORMAL HIGH (ref 0.0–0.2)

## 2021-12-27 LAB — SAMPLE TO BLOOD BANK

## 2021-12-27 MED ORDER — SODIUM CHLORIDE 0.9% FLUSH
10.0000 mL | INTRAVENOUS | Status: AC
  Administered 2021-12-27: 10 mL

## 2021-12-27 MED ORDER — HEPARIN SOD (PORK) LOCK FLUSH 100 UNIT/ML IV SOLN
500.0000 [IU] | Freq: Once | INTRAVENOUS | Status: AC
  Administered 2021-12-27: 500 [IU] via INTRAVENOUS

## 2021-12-27 NOTE — Progress Notes (Signed)
CRITICAL VALUE ALERT Critical value received:  WBC 110.9. Date of notification:  12-27-2021 Time of notification: 12:38 pm. Critical value read back:  Yes.   Nurse who received alert:  B.Briton Sellman RN MD notified time and response:  [name] @ 12:44pm.

## 2021-12-27 NOTE — Progress Notes (Signed)
Patients port flushed without difficulty.  Good blood return noted with no bruising or swelling noted at site.  Band aid applied.  VSS with discharge and left in satisfactory condition with no s/s of distress noted.   

## 2021-12-28 ENCOUNTER — Inpatient Hospital Stay: Payer: Medicare Other

## 2021-12-28 DIAGNOSIS — H35033 Hypertensive retinopathy, bilateral: Secondary | ICD-10-CM | POA: Diagnosis not present

## 2021-12-29 ENCOUNTER — Other Ambulatory Visit (HOSPITAL_COMMUNITY): Payer: Self-pay | Admitting: Hematology

## 2022-01-03 DIAGNOSIS — D47Z9 Other specified neoplasms of uncertain behavior of lymphoid, hematopoietic and related tissue: Secondary | ICD-10-CM | POA: Diagnosis not present

## 2022-01-03 DIAGNOSIS — Z515 Encounter for palliative care: Secondary | ICD-10-CM | POA: Diagnosis not present

## 2022-01-04 ENCOUNTER — Other Ambulatory Visit (HOSPITAL_COMMUNITY): Payer: Self-pay | Admitting: Hematology

## 2022-01-04 ENCOUNTER — Telehealth: Payer: Self-pay

## 2022-01-04 ENCOUNTER — Inpatient Hospital Stay (HOSPITAL_BASED_OUTPATIENT_CLINIC_OR_DEPARTMENT_OTHER): Payer: Medicare Other | Admitting: Hematology

## 2022-01-04 ENCOUNTER — Inpatient Hospital Stay: Payer: Medicare Other | Admitting: Hematology

## 2022-01-04 ENCOUNTER — Inpatient Hospital Stay: Payer: Medicare Other

## 2022-01-04 ENCOUNTER — Other Ambulatory Visit (HOSPITAL_COMMUNITY): Payer: Self-pay

## 2022-01-04 ENCOUNTER — Telehealth: Payer: Self-pay | Admitting: Pharmacist

## 2022-01-04 VITALS — BP 153/71 | HR 60 | Temp 97.5°F | Resp 18

## 2022-01-04 VITALS — Ht 64.0 in | Wt 130.6 lb

## 2022-01-04 DIAGNOSIS — E876 Hypokalemia: Secondary | ICD-10-CM | POA: Diagnosis not present

## 2022-01-04 DIAGNOSIS — D471 Chronic myeloproliferative disease: Secondary | ICD-10-CM

## 2022-01-04 DIAGNOSIS — D72829 Elevated white blood cell count, unspecified: Secondary | ICD-10-CM | POA: Diagnosis not present

## 2022-01-04 DIAGNOSIS — Z95828 Presence of other vascular implants and grafts: Secondary | ICD-10-CM

## 2022-01-04 DIAGNOSIS — M8580 Other specified disorders of bone density and structure, unspecified site: Secondary | ICD-10-CM | POA: Diagnosis not present

## 2022-01-04 DIAGNOSIS — L299 Pruritus, unspecified: Secondary | ICD-10-CM | POA: Diagnosis not present

## 2022-01-04 DIAGNOSIS — N183 Chronic kidney disease, stage 3 unspecified: Secondary | ICD-10-CM | POA: Diagnosis not present

## 2022-01-04 DIAGNOSIS — D649 Anemia, unspecified: Secondary | ICD-10-CM

## 2022-01-04 LAB — COMPREHENSIVE METABOLIC PANEL
ALT: 36 U/L (ref 0–44)
AST: 55 U/L — ABNORMAL HIGH (ref 15–41)
Albumin: 3.5 g/dL (ref 3.5–5.0)
Alkaline Phosphatase: 220 U/L — ABNORMAL HIGH (ref 38–126)
Anion gap: 6 (ref 5–15)
BUN: 24 mg/dL — ABNORMAL HIGH (ref 8–23)
CO2: 24 mmol/L (ref 22–32)
Calcium: 8.4 mg/dL — ABNORMAL LOW (ref 8.9–10.3)
Chloride: 110 mmol/L (ref 98–111)
Creatinine, Ser: 1.23 mg/dL — ABNORMAL HIGH (ref 0.44–1.00)
GFR, Estimated: 46 mL/min — ABNORMAL LOW (ref >60)
Glucose, Bld: 101 mg/dL — ABNORMAL HIGH (ref 70–99)
Potassium: 4.2 mmol/L (ref 3.5–5.1)
Sodium: 140 mmol/L (ref 135–145)
Total Bilirubin: 0.6 mg/dL (ref 0.3–1.2)
Total Protein: 7.1 g/dL (ref 6.5–8.1)

## 2022-01-04 LAB — MAGNESIUM: Magnesium: 1.8 mg/dL (ref 1.7–2.4)

## 2022-01-04 LAB — CBC WITH DIFFERENTIAL/PLATELET
Abs Immature Granulocytes: 20.7 10*3/uL — ABNORMAL HIGH (ref 0.00–0.07)
Band Neutrophils: 2 %
Basophils Absolute: 0 10*3/uL (ref 0.0–0.1)
Basophils Relative: 0 %
Eosinophils Absolute: 0.9 10*3/uL — ABNORMAL HIGH (ref 0.0–0.5)
Eosinophils Relative: 1 %
HCT: 25 % — ABNORMAL LOW (ref 36.0–46.0)
Hemoglobin: 8 g/dL — ABNORMAL LOW (ref 12.0–15.0)
Lymphocytes Relative: 10 %
Lymphs Abs: 9.4 10*3/uL — ABNORMAL HIGH (ref 0.7–4.0)
MCH: 29.3 pg (ref 26.0–34.0)
MCHC: 32 g/dL (ref 30.0–36.0)
MCV: 91.6 fL (ref 80.0–100.0)
Metamyelocytes Relative: 7 %
Monocytes Absolute: 11.3 10*3/uL — ABNORMAL HIGH (ref 0.1–1.0)
Monocytes Relative: 12 %
Myelocytes: 10 %
Neutro Abs: 51.8 10*3/uL — ABNORMAL HIGH (ref 1.7–7.7)
Neutrophils Relative %: 53 %
Platelets: 131 10*3/uL — ABNORMAL LOW (ref 150–400)
Promyelocytes Relative: 5 %
RBC: 2.73 MIL/uL — ABNORMAL LOW (ref 3.87–5.11)
RDW: 24.4 % — ABNORMAL HIGH (ref 11.5–15.5)
WBC: 94.2 10*3/uL (ref 4.0–10.5)
nRBC: 3.2 % — ABNORMAL HIGH (ref 0.0–0.2)

## 2022-01-04 LAB — SAMPLE TO BLOOD BANK

## 2022-01-04 LAB — LACTATE DEHYDROGENASE: LDH: 706 U/L — ABNORMAL HIGH (ref 98–192)

## 2022-01-04 MED ORDER — MOMELOTINIB DIHYDROCHLORIDE 100 MG PO TABS
100.0000 mg | ORAL_TABLET | Freq: Every day | ORAL | 0 refills | Status: DC
  Filled 2022-01-04: qty 30, fill #0

## 2022-01-04 MED ORDER — HEPARIN SOD (PORK) LOCK FLUSH 100 UNIT/ML IV SOLN
500.0000 [IU] | Freq: Once | INTRAVENOUS | Status: AC
  Administered 2022-01-04: 500 [IU] via INTRAVENOUS

## 2022-01-04 MED ORDER — SODIUM CHLORIDE 0.9% FLUSH
10.0000 mL | INTRAVENOUS | Status: DC | PRN
  Administered 2022-01-04: 10 mL via INTRAVENOUS

## 2022-01-04 MED ORDER — PROCHLORPERAZINE MALEATE 10 MG PO TABS: 10.0000 mg | ORAL_TABLET | Freq: Four times a day (QID) | ORAL | 1 refills | Status: DC | PRN

## 2022-01-04 NOTE — Patient Instructions (Addendum)
[place]  Discharge Instructions  You were seen and examined today by [name].  [name] discussed your most recent lab work which revealed that your labs look stable and white blood cell count has slightly improved.  Keep taking the Allopurinol daily to help prevent gout. [name] is changing your Jakafi to Momelotinib 100 mg once daily with food since you are not a candidate for the clinical trial. Starting today go to [facility] one pill every other day until you get the Momelotinib. When you get the Momelotinib start taking it once daily. [name] is sending in compazine to take every 6 hours as needed for nausea.   Follow-up as scheduled in 2 weeks with labs prior.    Thank you for choosing [facility] to provide your oncology and hematology care.   To afford each patient quality time with our provider, please arrive at least 15 minutes before your scheduled appointment time. You may need to reschedule your appointment if you arrive late (10 or more minutes). Arriving late affects you and other patients whose appointments are after yours.  Also, if you miss three or more appointments without notifying the office, you may be dismissed from the clinic at the provider's discretion.    Again, thank you for choosing [facility].  Our hope is that these requests will decrease the amount of time that you wait before being seen by our physicians.   If you have a lab appointment with the [facility] please come in thru the Main Entrance and check in at the main information desk.           _____________________________________________________________  Should you have questions after your visit to [facility], please contact our office at [phone] and follow the prompts.  Our office hours are 8:00 a.m. to 4:30 p.m. Monday - Thursday and 8:00 a.m. to 2:30 p.m. Friday.  Please note that  voicemails left after 4:00 p.m. may not be returned until the following business day.  We are closed weekends and all major holidays.  You do have access to a nurse 24-7, just call the main number to the clinic [phone] and do not press any options, hold on the line and a nurse will answer the phone.    For prescription refill requests, have your pharmacy contact our office and allow 72 hours.    Masks are optional in the cancer centers. If you would like for your care team to wear a mask while they are taking care of you, please let them know. You may have one support person who is at least 76 years old accompany you for your appointments.

## 2022-01-04 NOTE — Addendum Note (Signed)
Addended by: Derek Jack on: 01/04/2022 03:50 PM   Modules accepted: Orders

## 2022-01-04 NOTE — Progress Notes (Signed)
Patients port flushed without difficulty.  Good blood return noted with no bruising or swelling noted at site.  Band aid applied.  VSS with discharge and left in satisfactory condition with no s/s of distress noted.   

## 2022-01-04 NOTE — Telephone Encounter (Signed)
Oral Oncology Pharmacist Encounter  Received new prescription for Ojjaara (momelotinib) for the treatment of pre-fibrotic myelofibrosis, planned duration until disease progression or unacceptable drug toxicity.  CBC/CMP from 01/04/22 assessed, no relevant lab abnormalities. Prescription dose and frequency assessed. MD plans to titrate up to '200mg'$  if tolerated.  Current medication list in Epic reviewed, no DDIs with momelotinib identified.  Evaluated chart and no patient barriers to medication adherence identified.   Prescription has been e-scribed to the [facility] for benefits analysis and approval.  Oral Oncology Clinic will continue to follow for insurance authorization, copayment issues, initial counseling and start date.   Darl Pikes, PharmD, BCPS, BCOP, CPP Hematology/Oncology Clinical Pharmacist Practitioner [place]/[facility] Oral [facility] [phone]  01/04/2022 4:06 PM

## 2022-01-04 NOTE — Progress Notes (Signed)
CRITICAL VALUE ALERT Critical value received:  WBC 94.2. Date of notification:  01-04-2022 Time of notification: 14:07 pm Critical value read back:  Yes.   Nurse who received alert:  B.Jamielee Mchale RN. MD notified time and response:  [name] @ 15:24pm.

## 2022-01-04 NOTE — Progress Notes (Signed)
 [facility] [address] [place], [place] [address]   CLINIC:  Medical Oncology/Hematology  PCP:  Simpson, Margaret E, MD [address] / [place] [address] [phone]   REASON FOR VISIT:  Follow-up for MPL positive myeloproliferative neoplasm  PRIOR THERAPY: none  NGS Results: not done  CURRENT THERAPY: Jakafi 10 mg twice daily  INTERVAL HISTORY:  [name], a 76 y.o. female, seen for follow-up of MPL positive MPN.  She reports that she has been feeling well with energy levels of 70%.  Chronic back and leg pains have been stable.  She has been eating well and has gained about 4 pounds in the last 3 weeks.  Continues to report generalized itching which is stable.  REVIEW OF SYSTEMS:  Review of Systems  Musculoskeletal:  Positive for arthralgias (Hip, back and legs stable).  All other systems reviewed and are negative.   PAST MEDICAL/SURGICAL HISTORY:  Past Medical History:  Diagnosis Date   Acute cholangitis    Allergy    Anemia    Anxiety    Arthritis    Phreesia 03/18/2020   Barrett's esophagus    Cataract    Chronic back pain    Chronic neck pain    CKD (chronic kidney disease) stage 3, GFR 30-59 ml/min (HCC) 10/29/2020   Depression    Genital herpes    GERD (gastroesophageal reflux disease)    H/O degenerative disc disease    History of blood transfusion    Hypertension    Insomnia    Lupus (systemic lupus erythematosus) (HCC)    Neuromuscular disorder (HCC)    Osteoarthritis    S/P colonoscopy June 2005   normal, no polyps   S/P endoscopy June 2005, Oct 2009   2005: short-segment Barrett's, 2009: short-segment Barrett's   Upper respiratory tract infection 07/09/2020   UTI (lower urinary tract infection) 11/2012   Past Surgical History:  Procedure Laterality Date   ABDOMINAL HYSTERECTOMY     BACK SURGERY     BIOPSY N/A 03/20/2014   Procedure: BIOPSY;  Surgeon: Robert M Rourk, MD;  Location: [facility];  Service:  Endoscopy;  Laterality: N/A;   BIOPSY  09/14/2015   Procedure: BIOPSY;  Surgeon: Robert M Rourk, MD;  Location: [facility];  Service: Endoscopy;;  esophageal and gastric   BIOPSY  07/23/2019   Procedure: BIOPSY;  Surgeon: Rourk, Robert M, MD;  Location: [facility];  Service: Endoscopy;;  esophageal    CARPAL TUNNEL RELEASE Left 2013   cervical disectomy  2002   CESAREAN SECTION N/A    Phreesia 03/18/2020   CHOLECYSTECTOMY     with lysis of adhesions for sbo; "ruptured gallbladder".   COLONOSCOPY  11/09/2011   RMR: Melanosis coli   COLONOSCOPY WITH PROPOFOL N/A 09/14/2015   Dr. Rourk: diverticulosis, 3mm TA removed. next TCS 09/2020.    COLONOSCOPY WITH PROPOFOL N/A 04/30/2020   Procedure: COLONOSCOPY WITH PROPOFOL;  Surgeon: Rourk, Robert M, MD;  Location: [facility];  Service: Endoscopy;  Laterality: N/A;  PM (ASA 3)   DENTAL SURGERY  11/2015   multiple tooth extraction   ESOPHAGOGASTRODUODENOSCOPY  11/29/2007   salmon-colored  tongue   longest stable at  3 cm, distal esophagus as described previously status post biopsy/ Hiatal hernia, otherwise normal stomach D1 and D2   ESOPHAGOGASTRODUODENOSCOPY  01/06/11   short segment Barrett's esophagus s/p bx/Hiatal hernia   ESOPHAGOGASTRODUODENOSCOPY (EGD) WITH PROPOFOL N/A 03/20/2014   RMR:abnormal distal   esophagus short segment barrett's, bx with no dysplasia. next egd in 03/2017   ESOPHAGOGASTRODUODENOSCOPY (EGD) WITH PROPOFOL N/A 09/14/2015   Dr. Gala Romney: Barrett's without dysplasia, gastritis benign bx, hiatal hernia. next EGD 09/2018.   ESOPHAGOGASTRODUODENOSCOPY (EGD) WITH PROPOFOL N/A 07/23/2019   Procedure: ESOPHAGOGASTRODUODENOSCOPY (EGD) WITH PROPOFOL;  Surgeon: Daneil Dolin, MD;  Location: [facility];  Service: Endoscopy;  Laterality: N/A;  3:00pm   EYE SURGERY N/A    Phreesia 03/18/2020   HERNIA REPAIR Right 07/2010   Dr. Zada Girt   IR IMAGING GUIDED PORT INSERTION  02/09/2021   JOINT REPLACEMENT     LAPAROSCOPIC CHOLECYSTECTOMY   2017   at [facility]   POLYPECTOMY  09/14/2015   Procedure: POLYPECTOMY;  Surgeon: Daneil Dolin, MD;  Location: [facility];  Service: Endoscopy;;  ascending colon   right hip replacement  07/2010   went back in sept 2012 to fix   SHOULDER ARTHROSCOPY  2008   left   SPINE SURGERY N/A    Phreesia 03/18/2020   TOTAL HIP REVISION Right 12/17/2012   Procedure: RIGHT TOTAL HIP REVISION;  Surgeon: Mauri Pole, MD;  Location: [facility];  Service: Orthopedics;  Laterality: Right;   WRIST SURGERY Right 2011   open reduction right wrist.    SOCIAL HISTORY:  Social History   Socioeconomic History   Marital status: Married    Spouse name: louis   Number of children: 4   Years of education: 12+   Highest education level: Some college, no degree  Occupational History   Occupation: Press photographer  - retired   Occupation: non profit  Tobacco Use   Smoking status: Former    Packs/day: 0.25    Years: 25.00    Total pack years: 6.25    Types: Cigarettes    Quit date: 02/07/2003    Years since quitting: 18.9   Smokeless tobacco: Never   Tobacco comments:    quit in 2004  Vaping Use   Vaping Use: Never used  Substance and Sexual Activity   Alcohol use: No   Drug use: No   Sexual activity: Not Currently    Birth control/protection: Surgical  Other Topics Concern   Not on file  Social History Narrative   Not on file   Social Determinants of Health   Financial Resource Strain: Low Risk  (12/28/2020)   Overall Financial Resource Strain (CARDIA)    Difficulty of Paying Living Expenses: Not hard at all  Food Insecurity: No Food Insecurity (11/11/2021)   Hunger Vital Sign    Worried About Running Out of Food in the Last Year: Never true    [place] in the Last Year: Never true  Transportation Needs: No Transportation Needs (11/11/2021)   PRAPARE - Hydrologist (Medical): No    Lack of Transportation (Non-Medical): No  Physical Activity: Inactive  (04/23/2021)   Exercise Vital Sign    Days of Exercise per Week: 0 days    Minutes of Exercise per Session: 0 min  Stress: Stress Concern Present (04/23/2021)   [place]    Feeling of Stress : Rather much  Social Connections: Moderately Integrated (12/28/2020)   Social Connection and Isolation Panel [NHANES]    Frequency of Communication with Friends and Family: More than three times a week    Frequency of Social Gatherings with Friends and Family: Once a week    Attends Religious Services: More than  4 times per year    Active Member of Clubs or Organizations: No    Attends Archivist Meetings: Never    Marital Status: Married  Human resources officer Violence: Not At Risk (11/11/2021)   Humiliation, Afraid, Rape, and Kick questionnaire    Fear of Current or Ex-Partner: No    Emotionally Abused: No    Physically Abused: No    Sexually Abused: No    FAMILY HISTORY:  Family History  Problem Relation Age of Onset   Hypertension Mother    Stroke Mother    Colon cancer Neg Hx    Anesthesia problems Neg Hx    Hypotension Neg Hx    Malignant hyperthermia Neg Hx    Pseudochol deficiency Neg Hx    Gastric cancer Neg Hx    Esophageal cancer Neg Hx     CURRENT MEDICATIONS:  Current Outpatient Medications  Medication Sig Dispense Refill   acyclovir (ZOVIRAX) 800 MG tablet Take 1 tablet (800 mg total) by mouth 5 (five) times daily. 50 tablet 1   acyclovir ointment (ZOVIRAX) 5 % Apply 1 application. topically every 3 (three) hours. 15 g 1   albuterol (PROVENTIL) (2.5 MG/3ML) 0.083% nebulizer solution Take 3 mLs (2.5 mg total) by nebulization every 4 (four) hours as needed for wheezing or shortness of breath. 75 mL 12   albuterol (VENTOLIN HFA) 108 (90 Base) MCG/ACT inhaler Inhale 2 puffs into the lungs every 4 (four) hours as needed for wheezing or shortness of breath. 8 g 11   allopurinol (ZYLOPRIM) 300 MG tablet Take  1 tablet (300 mg total) by mouth daily. 30 tablet 3   budesonide-formoterol (SYMBICORT) 80-4.5 MCG/ACT inhaler Take 2 puffs first thing in am and then another 2 puffs about 12 hours later. 10.2 g 11   cetirizine (ZYRTEC) 10 MG tablet Take 10 mg by mouth daily.     citalopram (CELEXA) 20 MG tablet Take 1 tablet by mouth once daily 30 tablet 3   clotrimazole (MYCELEX) 10 MG troche Take one tablet by mouth four times daily 12 tablet 0   EPINEPHRINE 0.3 mg/0.3 mL IJ SOAJ injection INJECT 0.3 MLS INTO MUSCLE ONCE AS NEEDED (Patient taking differently: Inject 0.3 mg into the muscle as needed for anaphylaxis.) 1 Device 2   furosemide (LASIX) 20 MG tablet Take 1 tablet (20 mg total) by mouth daily as needed. 30 tablet 2   gabapentin (NEURONTIN) 300 MG capsule Take one capsule by mouth once daily, as needed, for pain (Patient taking differently: Take 300 mg by mouth daily as needed (Pain).) 90 capsule 1   lidocaine-prilocaine (EMLA) cream Apply 1 Application topically as needed (Access to port and pain). 30 g 6   metoprolol tartrate (LOPRESSOR) 25 MG tablet TAKE 1 & 1/2 (ONE & ONE-HALF) TABLETS (37.5 MG) BY MOUTH TWICE DAILY 90 tablet 0   mirtazapine (REMERON) 15 MG tablet TAKE 1 TABLET BY MOUTH AT BEDTIME 30 tablet 0   Momelotinib Dihydrochloride 100 MG TABS Take 100 mg by mouth daily. 30 tablet 0   Multiple Vitamin (MULTIVITAMIN WITH MINERALS) TABS tablet Take 1 tablet by mouth every other day.     oxyCODONE (OXY IR/ROXICODONE) 5 MG immediate release tablet Take 5 mg by mouth every 4 (four) hours as needed for moderate pain, severe pain or breakthrough pain.     pantoprazole (PROTONIX) 40 MG tablet Take 1 tablet (40 mg total) by mouth 2 (two) times daily before a meal. 60 tablet 0   potassium chloride (  KLOR-CON) 10 MEQ tablet Take 1 tablet by mouth once daily 30 tablet 4   promethazine-dextromethorphan (PROMETHAZINE-DM) 6.25-15 MG/5ML syrup Take half to one  teaspoon twice daily as needed for excessive  cough 180 mL 0   ruxolitinib phosphate (JAKAFI) 15 MG tablet Take 1 tablet (15 mg total) by mouth 2 (two) times daily. 60 tablet 3   temazepam (RESTORIL) 30 MG capsule Take 1 capsule (30 mg total) by mouth at bedtime as needed for sleep. 30 capsule 5   tiZANidine (ZANAFLEX) 4 MG tablet Take 2 tablets (8 mg total) by mouth 3 (three) times daily as needed (spasms). (Patient taking differently: Take 4-8 mg by mouth 3 (three) times daily as needed for muscle spasms.) 180 tablet 0   No current facility-administered medications for this visit.   Facility-Administered Medications Ordered in Other Visits  Medication Dose Route Frequency Provider Last Rate Last Admin   lanreotide acetate (SOMATULINE DEPOT) 120 MG/0.5ML injection            lanreotide acetate (SOMATULINE DEPOT) 120 MG/0.5ML injection            octreotide (SANDOSTATIN LAR) 30 MG IM injection             ALLERGIES:  Allergies  Allergen Reactions   Bee Venom Swelling and Hives   Norvasc [Amlodipine] Other (See Comments)    Hair loss   Tylenol [Acetaminophen] Itching   Red Dye Itching   Percocet [Oxycodone-Acetaminophen] Rash and Other (See Comments)    Pt states, "this gives her a rash, but at home she takes oxycodone for pain relief"    Tyloxapol Itching and Rash    PHYSICAL EXAM:  Performance status (ECOG): 1 - Symptomatic but completely ambulatory  There were no vitals filed for this visit.  Wt Readings from Last 3 Encounters:  01/04/22 130 lb 9.6 oz (59.2 kg)  12/17/21 126 lb 3.2 oz (57.2 kg)  12/13/21 128 lb 9.6 oz (58.3 kg)   Physical Exam Vitals reviewed.  Constitutional:      Appearance: Normal appearance.  Cardiovascular:     Rate and Rhythm: Normal rate and regular rhythm.     Pulses: Normal pulses.     Heart sounds: Normal heart sounds.  Pulmonary:     Effort: Pulmonary effort is normal.     Breath sounds: Normal breath sounds.  Abdominal:     Palpations: Abdomen is soft. There is hepatomegaly  (palpable 3 fingerbreadths below costal margin) and splenomegaly (2 fingerbreadths below costal margin'). There is no mass.     Tenderness: There is no abdominal tenderness.  Musculoskeletal:     Right lower leg: 1+ Edema present.     Left lower leg: 1+ Edema present.  Neurological:     General: No focal deficit present.     Mental Status: She is alert and oriented to person, place, and time.  Psychiatric:        Mood and Affect: Mood normal.        Behavior: Behavior normal.      LABORATORY DATA:  I have reviewed the labs as listed.     Latest Ref Rng & Units 01/04/2022    1:34 PM 12/27/2021   11:08 AM 12/20/2021   11:05 AM  CBC  WBC 4.0 - 10.5 K/uL 94.2  110.9  113.3   Hemoglobin 12.0 - 15.0 g/dL 8.0  9.7  7.7   Hematocrit 36.0 - 46.0 % 25.0  30.6  24.4   Platelets 150 - 400 K/uL   131  154  103       Latest Ref Rng & Units 01/04/2022    1:34 PM 12/13/2021   11:56 AM 11/17/2021   11:17 AM  CMP  Glucose 70 - 99 mg/dL 101  123  105   BUN 8 - 23 mg/dL 24  41  27   Creatinine 0.44 - 1.00 mg/dL 1.23  2.25  1.97   Sodium 135 - 145 mmol/L 140  138  138   Potassium 3.5 - 5.1 mmol/L 4.2  4.0  4.2   Chloride 98 - 111 mmol/L 110  109  111   CO2 22 - 32 mmol/L _0 Calcium 8.9 - 10.3 mg/dL 8.4  8.0  8.2   Total Protein 6.5 - 8.1 g/dL 7.1  7.6  7.0   Total Bilirubin 0.3 - 1.2 mg/dL 0.6  0.6  0.5   Alkaline Phos 38 - 126 U/L 220  166  200   AST 15 - 41 U/L 55  36  49   ALT 0 - 44 U/L 36  19  41     DIAGNOSTIC IMAGING:  I have independently reviewed the scans and discussed with the patient. DG HIP UNILAT W OR W/O PELVIS 2-3 VIEWS RIGHT  Result Date: 12/12/2021 CLINICAL DATA:  Fall with right lateral hip pain EXAM: DG HIP (WITH OR WITHOUT PELVIS) 2-3V RIGHT COMPARISON:  Radiographs 09/01/2021 FINDINGS: Right THA. No radiographic evidence of loosening. No acute fracture or dislocation. IMPRESSION: No evidence of fracture.  Right THA. Electronically Signed   By: Placido Sou M.D.   On: 12/12/2021 21:51   [place] RT LOWER EXTREM LTD SOFT TISSUE NON VASCULAR  Result Date: 12/07/2021 CLINICAL DATA:  Subcutaneous masses/nodules RIGHT lower extremity EXAM: ULTRASOUND RIGHT LOWER EXTREMITY LIMITED ULTRASOUND LEFT LOWER EXTREMITY LIMITED TECHNIQUE: Ultrasound examination of the lower extremity soft tissues was performed in the area of clinical concern. COMPARISON:  None Available. FINDINGS: At the sites of palpable/clinical concern in BILATERAL lower extremities, no focal sonographic abnormalities are identified. No subcutaneous nodule or cyst is seen. No abnormal fluid collections, mass or calcification. IMPRESSION: Negative ultrasound of the palpable sites of clinical concern in the lower extremities bilaterally. Electronically Signed   By: Lavonia Dana M.D.   On: 12/07/2021 16:45   [place] LT LOWER EXTREM LTD SOFT TISSUE NON VASCULAR  Result Date: 12/07/2021 CLINICAL DATA:  Subcutaneous masses/nodules RIGHT lower extremity EXAM: ULTRASOUND RIGHT LOWER EXTREMITY LIMITED ULTRASOUND LEFT LOWER EXTREMITY LIMITED TECHNIQUE: Ultrasound examination of the lower extremity soft tissues was performed in the area of clinical concern. COMPARISON:  None Available. FINDINGS: At the sites of palpable/clinical concern in BILATERAL lower extremities, no focal sonographic abnormalities are identified. No subcutaneous nodule or cyst is seen. No abnormal fluid collections, mass or calcification. IMPRESSION: Negative ultrasound of the palpable sites of clinical concern in the lower extremities bilaterally. Electronically Signed   By: Lavonia Dana M.D.   On: 12/07/2021 16:45     ASSESSMENT:  MPL positive prefibrotic/early primary myelofibrosis: - CBC on 11/06/2020 with white count 75.6, differential 59% neutrophils, 12% monocytes, 4% lymphocytes, 1 percentage of eosinophils and basophils, 6% band neutrophils, 12% myelocytes, 1% promyelocytes, 29% blasts. - Pathologist review of blood smear reported as  leukoerythroblastic reaction. - 25 pound weight loss in the last 6 months, unintentional.  Decreased appetite.  Reports fatigue for the last few months.  Reports night sweats x3 in the last 1 month. - Bone marrow biopsy  on 11/18/2020 consistent with granulocytic proliferation with differential including CMML versus myeloproliferative disorder. - BCR/ABL was negative. 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 - NGS myeloid panel shows mutations in ASXL1, MPL, TET2, EZH2 mutations. - Spleen ultrasound on 12/16/2020 shows mildly enlarged measuring 12.4 x 9.6 x 7 cm with volume of 435 cc. - PDGFR alpha, beta and FGFR 1 was negative. - She was evaluated by Dr. Powell at [facility].  Slides were reviewed at [facility] hematopathology.  They thought it was less likely CMML and more likely MPL positive myeloproliferative neoplasm.  Dr. Powell has recommended initiate Jakafi. - Given anemia, B symptoms, cytogenetics with MPL, ASLX1, normal karyotype she is intermediate risk on GIPPS at high risk [id number]+ - Ruxolitinib 10 mg twice daily started on 02/16/2021.  Dose increased to 15 mg twice daily on 05/18/2021. - CTAP on 04/07/2021: Hepatomegaly and splenomegaly measuring 14.3 cm.  No other pathology.  No spleen infarcts. -Ruxolitinib dose increased to 15 mg twice daily on 09/20/2021. - Ultrasound spleen (09/21/2021): 13.6 cm with volume 532 mL.  This is slightly improved from previous volume. - Bone marrow biopsy (09/09/2021): Material is limited but overall features consistent with myeloid neoplasm.  No increase in blasts.  Erythroid precursors appear decreased but megakaryocytes are variably evident with clusters and small forms.  Reticulin's shows variable increase in reticulin fibers including areas with prominent increase.  Chromosome analysis was normal. - Tapering doses of ruxolitinib started on 12/24/2021 - Momelotinib 100 mg daily started on  2.  Social/family history: -  She lives at home and is able to do all her ADLs and IADLs although she is getting tired lately.  She reports quitting smoking 6 months ago and smoked 1 pack/week for 43 years. - She believes that her mother had some kind of leukemia.  No other malignancies.   PLAN:  MPL positive myeloproliferative neoplasm: - She was evaluated by Dr. Powell on 12/24/2021.  I have reviewed records. - They have looked into clinical trials.  She is not eligible for current clinical trials to help. - She has cut back on Jakafi to 15 mg once daily on 12/24/2021. - I have recommended her to decrease Jakafi to 15 mg every other day. - We have talked to her about switching to Momelotinib.  I will start her on 100 mg daily and titrate up to 200 mg daily as tolerated.  We discussed side effects in detail including but not limited to diarrhea, nausea, thrombocytopenia, elevated liver enzymes, fluid retention among others. - We will send a prescription to her specialty pharmacy. - RTC 2 weeks after the start of medication with repeat labs including liver function panel.  2.  Decreased appetite: - She reports she has been eating well.  She has gained 4 pounds since last visit 3 weeks ago.  3.  CKD: - Baseline creatinine between 1.3-1.5.  Today creatinine is 1.23. - Creatinine has improved since she has been off of losartan.  She is on metoprolol twice daily for her blood pressure.  She has not required Lasix in the last 1 month. - Systolic blood pressure is 153/71 today.  She has follow-up with Dr. Simpson on Friday.  I will leave it to Dr. Simpson for management of hypertension.  4.  Hypokalemia: - Continue potassium supplements.  Potassium is normal today.  5.  Generalized itching: - She continues to have generalized itching which was more pronounced when she was off of ruxolitinib.    We will see how it improves as we are changing the medication.    Orders placed this encounter:  Orders Placed This Encounter   Procedures   CBC with Differential/Platelet   Comprehensive metabolic panel   Uric acid   Lactate dehydrogenase   Magnesium        , MD [facility] [phone]     

## 2022-01-04 NOTE — Telephone Encounter (Signed)
Oral Oncology Patient Advocate Encounter   Received notification that prior authorization for Ojjaara is required.   PA submitted on 01/04/22  Key [id number]  Status is pending     [name], [place] Patient Renee Waters  [phone] (phone) [phone] (fax) 01/04/2022 4:06 PM

## 2022-01-05 ENCOUNTER — Inpatient Hospital Stay: Payer: Medicare Other

## 2022-01-06 ENCOUNTER — Telehealth: Payer: Self-pay

## 2022-01-06 ENCOUNTER — Other Ambulatory Visit: Payer: Self-pay | Admitting: Hematology

## 2022-01-06 ENCOUNTER — Other Ambulatory Visit (HOSPITAL_COMMUNITY): Payer: Self-pay

## 2022-01-06 NOTE — Telephone Encounter (Addendum)
Oral Oncology Patient Advocate Encounter  Reached out and spoke with patient regarding PAP paperwork, explained that I would send it to their preferred email via DocuSign.   Confirmed email address: [email or web address].    Patient expressed understanding and consent.  Will follow up once paperwork has been signed and returned.   [name], [facility] Patient [place]  [phone] (phone) [phone] (fax) 01/06/2022 10:11 AM

## 2022-01-06 NOTE — Telephone Encounter (Signed)
Oral Oncology Patient Advocate Encounter   Began application for assistance for [place] through [place].   Application will be submitted upon completion of necessary supporting documentation.   GSK's phone number [phone].   I will continue to check the status until final determination.   [name], [facility] Patient [place]  [phone] (phone) [phone] (fax) 01/06/2022 10:10 AM

## 2022-01-07 ENCOUNTER — Other Ambulatory Visit (HOSPITAL_COMMUNITY): Payer: Self-pay

## 2022-01-07 ENCOUNTER — Encounter: Payer: Self-pay | Admitting: Family Medicine

## 2022-01-07 ENCOUNTER — Ambulatory Visit (INDEPENDENT_AMBULATORY_CARE_PROVIDER_SITE_OTHER): Payer: Medicare Other | Admitting: Family Medicine

## 2022-01-07 VITALS — BP 174/84 | HR 77 | Ht 65.0 in | Wt 131.0 lb

## 2022-01-07 DIAGNOSIS — R52 Pain, unspecified: Secondary | ICD-10-CM

## 2022-01-07 DIAGNOSIS — R053 Chronic cough: Secondary | ICD-10-CM | POA: Diagnosis not present

## 2022-01-07 DIAGNOSIS — R0989 Other specified symptoms and signs involving the circulatory and respiratory systems: Secondary | ICD-10-CM | POA: Diagnosis not present

## 2022-01-07 DIAGNOSIS — M25551 Pain in right hip: Secondary | ICD-10-CM

## 2022-01-07 DIAGNOSIS — R0902 Hypoxemia: Secondary | ICD-10-CM

## 2022-01-07 MED ORDER — SULFAMETHOXAZOLE-TRIMETHOPRIM 800-160 MG PO TABS: 1.0000 | ORAL_TABLET | Freq: Two times a day (BID) | ORAL | 0 refills | Status: DC

## 2022-01-07 MED ORDER — METOPROLOL TARTRATE 25 MG PO TABS: ORAL_TABLET | ORAL | 3 refills | Status: DC

## 2022-01-07 NOTE — Patient Instructions (Signed)
F/u mid to end January, call if you need me sooner  Please take one an a half  metoprolol tabs two times daily  Call and reschedule Cardiology appt  1 week of Septra is prescribed  We will check on oxygen and get back to you

## 2022-01-07 NOTE — Telephone Encounter (Signed)
Called [employer] to check on Clinical Questions not populating for Prior Authorization. PA Representative initiated new PA through [facility] with a new key of [id number]. Informed to wait 3 to 4 hours for clinical questions to populate. I will continue to check status.   [name], [facility] Patient [place]  [phone] (phone) [phone] (fax) 01/07/2022 10:17 AM

## 2022-01-08 ENCOUNTER — Other Ambulatory Visit: Payer: Self-pay | Admitting: Family Medicine

## 2022-01-10 ENCOUNTER — Other Ambulatory Visit (HOSPITAL_COMMUNITY): Payer: Self-pay

## 2022-01-10 NOTE — Telephone Encounter (Signed)
Oral Oncology Patient Advocate Encounter  Prior Authorization for [employer] has been approved.    PA# [id number]  Effective dates: 12.01.23 through 06.01.24  Patients co-pay is $1,439.15.    [name], [facility] Patient [place]  [phone] (phone) [phone] (fax) 01/10/2022 8:14 AM

## 2022-01-11 ENCOUNTER — Ambulatory Visit (INDEPENDENT_AMBULATORY_CARE_PROVIDER_SITE_OTHER): Payer: Medicare Other | Admitting: Internal Medicine

## 2022-01-11 ENCOUNTER — Encounter: Payer: Self-pay | Admitting: Internal Medicine

## 2022-01-11 ENCOUNTER — Other Ambulatory Visit (HOSPITAL_COMMUNITY): Payer: Self-pay

## 2022-01-11 ENCOUNTER — Encounter: Payer: Self-pay | Admitting: Family Medicine

## 2022-01-11 ENCOUNTER — Other Ambulatory Visit: Payer: Self-pay

## 2022-01-11 ENCOUNTER — Inpatient Hospital Stay: Payer: Medicare Other | Attending: Hematology

## 2022-01-11 DIAGNOSIS — N189 Chronic kidney disease, unspecified: Secondary | ICD-10-CM | POA: Insufficient documentation

## 2022-01-11 DIAGNOSIS — Z79622 Long term (current) use of janus kinase inhibitor: Secondary | ICD-10-CM | POA: Diagnosis not present

## 2022-01-11 DIAGNOSIS — E876 Hypokalemia: Secondary | ICD-10-CM | POA: Insufficient documentation

## 2022-01-11 DIAGNOSIS — D471 Chronic myeloproliferative disease: Secondary | ICD-10-CM | POA: Insufficient documentation

## 2022-01-11 DIAGNOSIS — Z0000 Encounter for general adult medical examination without abnormal findings: Secondary | ICD-10-CM | POA: Diagnosis not present

## 2022-01-11 LAB — CBC WITH DIFFERENTIAL/PLATELET
Abs Immature Granulocytes: 34.6 10*3/uL — ABNORMAL HIGH (ref 0.00–0.07)
Band Neutrophils: 5 %
Basophils Absolute: 2.3 10*3/uL — ABNORMAL HIGH (ref 0.0–0.1)
Basophils Relative: 2 %
Blasts: 1 %
Eosinophils Absolute: 1.2 10*3/uL — ABNORMAL HIGH (ref 0.0–0.5)
Eosinophils Relative: 1 %
HCT: 23.9 % — ABNORMAL LOW (ref 36.0–46.0)
Hemoglobin: 7.8 g/dL — ABNORMAL LOW (ref 12.0–15.0)
Lymphocytes Relative: 6 %
Lymphs Abs: 6.9 10*3/uL — ABNORMAL HIGH (ref 0.7–4.0)
MCH: 29.7 pg (ref 26.0–34.0)
MCHC: 32.6 g/dL (ref 30.0–36.0)
MCV: 90.9 fL (ref 80.0–100.0)
Metamyelocytes Relative: 17 %
Monocytes Absolute: 15 10*3/uL — ABNORMAL HIGH (ref 0.1–1.0)
Monocytes Relative: 13 %
Myelocytes: 7 %
Neutro Abs: 54.1 10*3/uL — ABNORMAL HIGH (ref 1.7–7.7)
Neutrophils Relative %: 42 %
Platelets: 140 10*3/uL — ABNORMAL LOW (ref 150–400)
Promyelocytes Relative: 6 %
RBC: 2.63 MIL/uL — ABNORMAL LOW (ref 3.87–5.11)
RDW: 25 % — ABNORMAL HIGH (ref 11.5–15.5)
WBC: 115.2 10*3/uL (ref 4.0–10.5)
nRBC: 2.8 % — ABNORMAL HIGH (ref 0.0–0.2)

## 2022-01-11 LAB — SAMPLE TO BLOOD BANK

## 2022-01-11 MED ORDER — HEPARIN SOD (PORK) LOCK FLUSH 100 UNIT/ML IV SOLN
500.0000 [IU] | Freq: Once | INTRAVENOUS | Status: AC
  Administered 2022-01-11: 500 [IU] via INTRAVENOUS

## 2022-01-11 MED ORDER — EPINEPHRINE 0.3 MG/0.3ML IJ SOAJ: INTRAMUSCULAR | 2 refills | Status: DC

## 2022-01-11 MED ORDER — EPINEPHRINE 0.3 MG/0.3ML IJ SOAJ: INTRAMUSCULAR | 2 refills | Status: AC

## 2022-01-11 MED ORDER — SODIUM CHLORIDE 0.9% FLUSH
10.0000 mL | INTRAVENOUS | Status: AC
  Administered 2022-01-11: 10 mL

## 2022-01-11 MED ORDER — BUSPIRONE HCL 5 MG PO TABS: ORAL_TABLET | ORAL | 2 refills | Status: DC

## 2022-01-11 NOTE — Assessment & Plan Note (Signed)
Current exacerbation, with h/o prolonged pneumonia and respiratory compromise , 1 week course of septra is prescribed

## 2022-01-11 NOTE — Progress Notes (Signed)
Renee Waters presented for Portacath access and flush with lab draw.  Portacath located right chest wall accessed with  H 20 needle.  Good blood return present. Portacath flushed with 28m NS and 500U/58mHeparin and needle removed intact.  Procedure tolerated well and without incident. No complaints at this time. Discharged from clinic ambulatory in stable condition. Alert and oriented x 3. [facility]

## 2022-01-11 NOTE — Progress Notes (Signed)
Subjective:  .This is a telephone encounter between Renee Waters and Renee Waters on 01/11/2022 for AWV. The visit was conducted with the patient located at home and Renee Waters at [facility]. The patient's identity was confirmed using their DOB and current address. The patient has consented to being evaluated through a telephone encounter and understands the associated risks (an examination cannot be done and the patient may need to come in for an appointment) / benefits (allows the patient to remain at home, decreasing exposure to coronavirus).      Renee Waters is a 76 y.o. female who presents for Medicare Annual (Subsequent) preventive examination.  Review of Systems    Review of Systems  All other systems reviewed and are negative.    Objective:    Today's Vitals   01/11/22 1303  PainSc: 9    There is no height or weight on file to calculate BMI.     01/04/2022    2:21 PM 12/20/2021   11:16 AM 12/13/2021    1:08 PM 11/18/2021    3:47 PM 11/17/2021   11:58 AM 11/10/2021    6:50 PM 10/26/2021    1:37 PM  Advanced Directives  Does Patient Have a Medical Advance Directive? Yes Yes Yes Yes Yes Yes No  Type of Paramedic of [place];Living will [place];Living will   [place];Living will [place];Living will Living will  Does patient want to make changes to medical advance directive? No - Patient declined No - Patient declined  No - Patient declined   No - Patient declined  Copy of [place] in Chart? No - copy requested No - copy requested No - copy requested  No - copy requested Yes - validated most recent copy scanned in chart (See row information) No - copy requested  Would patient like information on creating a medical advance directive? No - Patient declined No - Patient declined  No - Patient declined No - Patient declined  No - Patient declined    Current Medications  (verified) Outpatient Encounter Medications as of 01/11/2022  Medication Sig   acyclovir (ZOVIRAX) 800 MG tablet Take 1 tablet (800 mg total) by mouth 5 (five) times daily.   acyclovir ointment (ZOVIRAX) 5 % Apply 1 application. topically every 3 (three) hours.   albuterol (PROVENTIL) (2.5 MG/3ML) 0.083% nebulizer solution Take 3 mLs (2.5 mg total) by nebulization every 4 (four) hours as needed for wheezing or shortness of breath.   albuterol (VENTOLIN HFA) 108 (90 Base) MCG/ACT inhaler Inhale 2 puffs into the lungs every 4 (four) hours as needed for wheezing or shortness of breath.   allopurinol (ZYLOPRIM) 300 MG tablet Take 1 tablet (300 mg total) by mouth daily.   budesonide-formoterol (SYMBICORT) 80-4.5 MCG/ACT inhaler Take 2 puffs first thing in am and then another 2 puffs about 12 hours later.   cetirizine (ZYRTEC) 10 MG tablet Take 10 mg by mouth daily.   citalopram (CELEXA) 20 MG tablet Take 1 tablet by mouth once daily   EPINEPHRINE 0.3 mg/0.3 mL IJ SOAJ injection INJECT 0.3 MLS INTO MUSCLE ONCE AS NEEDED (Patient taking differently: Inject 0.3 mg into the muscle as needed for anaphylaxis.)   furosemide (LASIX) 20 MG tablet TAKE 1 TABLET BY MOUTH ONCE DAILY AS NEEDED   gabapentin (NEURONTIN) 300 MG capsule Take one capsule by mouth once daily, as needed, for pain (Patient taking differently: Take 300 mg by mouth daily as  needed (Pain).)   hydrOXYzine (ATARAX) 25 MG tablet Take 12.5-25 mg by mouth 2 (two) times daily as needed.   lidocaine-prilocaine (EMLA) cream Apply 1 Application topically as needed (Access to port and pain).   metoprolol tartrate (LOPRESSOR) 25 MG tablet Take one and a half tablets twice daily   mirtazapine (REMERON) 15 MG tablet TAKE 1 TABLET BY MOUTH AT BEDTIME   Multiple Vitamin (MULTIVITAMIN WITH MINERALS) TABS tablet Take 1 tablet by mouth every other day.   oxyCODONE (OXY IR/ROXICODONE) 5 MG immediate release tablet Take 5 mg by mouth every 4 (four) hours as  needed for moderate pain, severe pain or breakthrough pain.   pantoprazole (PROTONIX) 40 MG tablet Take 1 tablet (40 mg total) by mouth 2 (two) times daily before a meal.   potassium chloride (KLOR-CON) 10 MEQ tablet Take 1 tablet by mouth once daily   prochlorperazine (COMPAZINE) 10 MG tablet Take 1 tablet (10 mg total) by mouth every 6 (six) hours as needed for nausea or vomiting.   ruxolitinib phosphate (JAKAFI) 15 MG tablet Take 1 tablet (15 mg total) by mouth 2 (two) times daily.   sulfamethoxazole-trimethoprim (BACTRIM DS) 800-160 MG tablet Take 1 tablet by mouth 2 (two) times daily.   temazepam (RESTORIL) 30 MG capsule Take 1 capsule (30 mg total) by mouth at bedtime as needed for sleep.   tiZANidine (ZANAFLEX) 4 MG tablet Take by mouth.   Facility-Administered Encounter Medications as of 01/11/2022  Medication   lanreotide acetate (SOMATULINE DEPOT) 120 MG/0.5ML injection   lanreotide acetate (SOMATULINE DEPOT) 120 MG/0.5ML injection   octreotide (SANDOSTATIN LAR) 30 MG IM injection    Allergies (verified) Bee venom, Norvasc [amlodipine], Tylenol [acetaminophen], Hydralazine, Red dye, Percocet [oxycodone-acetaminophen], and Tyloxapol   History: Past Medical History:  Diagnosis Date   Acute cholangitis    Allergy    Anemia    Anxiety    Arthritis    Phreesia 03/18/2020   Barrett's esophagus    Cataract    Chronic back pain    Chronic neck pain    CKD (chronic kidney disease) stage 3, GFR 30-59 ml/min ([place]) 10/29/2020   Depression    Genital herpes    GERD (gastroesophageal reflux disease)    H/O degenerative disc disease    History of blood transfusion    Hypertension    Insomnia    Lupus (systemic lupus erythematosus) ([place])    Neuromuscular disorder ([place])    Osteoarthritis    S/P colonoscopy June 2005   normal, no polyps   S/P endoscopy June 2005, Oct 2009   2005: short-segment Barrett's, 2009: short-segment Barrett's   Upper respiratory tract infection 07/09/2020    UTI (lower urinary tract infection) 11/2012   Past Surgical History:  Procedure Laterality Date   ABDOMINAL HYSTERECTOMY     BACK SURGERY     BIOPSY N/A 03/20/2014   Procedure: BIOPSY;  Surgeon: Daneil Dolin, MD;  Location: [facility];  Service: Endoscopy;  Laterality: N/A;   BIOPSY  09/14/2015   Procedure: BIOPSY;  Surgeon: Daneil Dolin, MD;  Location[facility];  Service: Endoscopy;;  esophageal and gastric   BIOPSY  07/23/2019   Procedure: BIOPSY;  Surgeon: Daneil Dolin, MD;  [facility];  Service: Endoscopy;;  esophageal    CARPAL TUNNEL RELEASE Left 2013   cervical disectomy  2002   CESAREAN SECTION N/A    Phreesia 03/18/2020   CHOLECYSTECTOMY     with lysis of adhesions for sbo; "  ruptured gallbladder".   COLONOSCOPY  11/09/2011   RMR: Melanosis coli   COLONOSCOPY WITH PROPOFOL N/A 09/14/2015   Dr. Gala Romney: diverticulosis, 70m TA removed. next TCS 09/2020.    COLONOSCOPY WITH PROPOFOL N/A 04/30/2020   Procedure: COLONOSCOPY WITH PROPOFOL;  Surgeon: RDaneil Dolin MD;  [facility];  Service: Endoscopy;  Laterality: N/A;  PM (ASA 3)   DENTAL SURGERY  11/2015   multiple tooth extraction   ESOPHAGOGASTRODUODENOSCOPY  11/29/2007   salmon-colored  tongue   longest stable at  3 cm, distal esophagus as described previously status post biopsy/ Hiatal hernia, otherwise normal stomach D1 and D2   ESOPHAGOGASTRODUODENOSCOPY  01/06/11   short segment Barrett's esophagus s/p bx/Hiatal hernia   ESOPHAGOGASTRODUODENOSCOPY (EGD) WITH PROPOFOL N/A 03/20/2014   RKGU:RKYHCWCBdistal esophagus short segment barrett's, bx with no dysplasia. next egd in 03/2017   ESOPHAGOGASTRODUODENOSCOPY (EGD) WITH PROPOFOL N/A 09/14/2015   Dr. RGala Romney Barrett's without dysplasia, gastritis benign bx, hiatal hernia. next EGD 09/2018.   ESOPHAGOGASTRODUODENOSCOPY (EGD) WITH PROPOFOL N/A 07/23/2019   Procedure: ESOPHAGOGASTRODUODENOSCOPY (EGD) WITH PROPOFOL;  Surgeon: RDaneil Dolin MD;  [facility]  [facility];  Service: Endoscopy;  Laterality: N/A;  3:00pm   EYE SURGERY N/A    Phreesia 03/18/2020   HERNIA REPAIR Right 07/2010   Dr. BZada Girt  IR IMAGING GUIDED PORT INSERTION  02/09/2021   JOINT REPLACEMENT     L[facility]  POLYPECTOMY  09/14/2015   Procedure: POLYPECTOMY;  Surgeon: RDaneil Dolin MD;  [facility];  Service: Endoscopy;;  ascending colon   right hip replacement  07/2010   went back in sept 2012 to fix   SHOULDER ARTHROSCOPY  2008   left   SPINE SURGERY N/A    Phreesia 03/18/2020   TOTAL HIP REVISION Right 12/17/2012   Procedure: RIGHT TOTAL HIP REVISION;  Surgeon: MMauri Pole MD;  [facility];  Service: Orthopedics;  Laterality: Right;   WRIST SURGERY Right 2011   open reduction right wrist.   Family History  Problem Relation Age of Onset   Hypertension Mother    Stroke Mother    Colon cancer Neg Hx    Anesthesia problems Neg Hx    Hypotension Neg Hx    Malignant hyperthermia Neg Hx    Pseudochol deficiency Neg Hx    Gastric cancer Neg Hx    Esophageal cancer Neg Hx    Social History   Socioeconomic History   Marital status: Married    Spouse name: louis   Number of children: 4   Years of education: 12+   Highest education level: Some college, no degree  Occupational History   Occupation: aPress photographer - retired   Occupation: non profit  Tobacco Use   Smoking status: Former    Packs/day: 0.25    Years: 25.00    Total pack years: 6.25    Types: Cigarettes    Quit date: 02/07/2003    Years since quitting: 18.9   Smokeless tobacco: Never   Tobacco comments:    quit in 2004  Vaping Use   Vaping Use: Never used  Substance and Sexual Activity   Alcohol use: No   Drug use: No   Sexual activity: Not Currently    Birth control/protection: Surgical  Other Topics Concern   Not on file  Social History Narrative   Not on file   Social Determinants of Health   Financial Resource Strain:  Low Risk  (12/28/2020)   Overall Financial Resource Strain (CARDIA)    Difficulty of Paying Living Expenses: Not hard at all  Food Insecurity: No Food Insecurity (11/11/2021)   Hunger Vital Sign    Worried About Running Out of Food in the Last Year: Never true    Ran Out of Food in the Last Year: Never true  Transportation Needs: No Transportation Needs (11/11/2021)   PRAPARE - Hydrologist (Medical): No    Lack of Transportation (Non-Medical): No  Physical Activity: Inactive (04/23/2021)   Exercise Vital Sign    Days of Exercise per Week: 0 days    Minutes of Exercise per Session: 0 min  Stress: Stress Concern Present (04/23/2021)   [place]    Feeling of Stress : Rather much  Social Connections: Moderately Integrated (12/28/2020)   Social Connection and Isolation Panel [NHANES]    Frequency of Communication with Friends and Family: More than three times a week    Frequency of Social Gatherings with Friends and Family: Once a week    Attends Religious Services: More than 4 times per year    Active Member of [employer] or Organizations: No    Attends Archivist Meetings: Never    Marital Status: Married    Tobacco Counseling Counseling given: Not Answered Tobacco comments: quit in 2004   Clinical Intake:  Pre-visit preparation completed: Yes  Pain : No/denies pain Pain Score: 9  Pain Location: Other (Comment) (spine hip and down right side) Pain Frequency: Constant     Diabetes: No  How often do you need to have someone help you when you read instructions, pamphlets, or other written materials from your doctor or pharmacy?: 1 - Never What is the last grade level you completed in school?: 2 years of college  Diabetic?No     Activities of Daily Living    11/11/2021   11:00 PM 10/19/2021    9:34 PM  In your present state of health, do you have any difficulty  performing the following activities:  Hearing? 0   Vision? 0   Difficulty concentrating or making decisions? 0   Walking or climbing stairs? 1   Dressing or bathing? 0   Doing errands, shopping? 0 0    Patient Care Team: [name], MD as PCP - General Branch, Royetta Crochet, MD as PCP - Cardiology (Cardiology) Gala Romney [name], MD (Gastroenterology) Carole Civil, MD as Consulting Physician (Orthopedic Surgery) Derek Jack, MD as Medical Oncologist (Oncology) Brien Mates, RN as Oncology Nurse Navigator (Oncology)  Indicate any recent Medical Services you may have received from other than [facility] providers in the past year (date may be approximate).     Assessment:   This is a routine wellness examination for [place].  Hearing/Vision screen No results found.  Dietary issues and exercise activities discussed:     Goals Addressed   None    Depression Screen    01/11/2022   12:00 PM 01/07/2022   11:25 AM 12/09/2021    1:28 PM 10/21/2021   11:41 AM 10/08/2021   11:32 AM 09/10/2021    8:10 AM 08/04/2021    3:42 PM  PHQ 2/9 Scores  PHQ - 2 Score 2 2 0 0 0 0 0  PHQ- 9 Score 9 8         Fall Risk    01/07/2022   11:25 AM 12/09/2021  1:28 PM 10/21/2021   11:41 AM 10/08/2021   11:32 AM 09/10/2021    8:10 AM  Fall Risk   Falls in the past year? 1 1 0 1 1  Number falls in past yr: 1 1 0 1 0  Injury with Fall? 0 1 0 1 0  Risk for fall due to : History of fall(s) Impaired balance/gait;Impaired mobility;History of fall(s) No Fall Risks History of fall(s) No Fall Risks  Follow up Falls evaluation completed Falls evaluation completed Falls evaluation completed Falls evaluation completed Falls evaluation completed    [place]:  Any stairs in or around the home? Yes  If so, are there any without handrails? Yes  Home free of loose throw rugs in walkways, pet beds, electrical cords, etc? Yes  Adequate lighting in your home to  reduce risk of falls? Yes   ASSISTIVE DEVICES UTILIZED TO PREVENT FALLS:  Life alert? No  Use of a cane, walker or w/c? Yes  Grab bars in the bathroom? Yes  Shower chair or bench in shower? No  Elevated toilet seat or a handicapped toilet? No    Cognitive Function:    12/03/2018   11:26 AM  MMSE - Mini Mental State Exam  Orientation to time 5  Orientation to Place 5  Registration 3  Attention/ Calculation 5  Recall 3  Language- name 2 objects 2  Language- repeat 1  Language- follow 3 step command 3  Language- read & follow direction 1  Write a sentence 1  Copy design 1  Total score 30        12/28/2020    3:22 PM 12/18/2019   11:48 AM 12/10/2018   11:30 AM 11/29/2017   11:27 AM 02/08/2017    2:07 PM  6CIT Screen  What Year? 0 points 0 points 0 points 0 points 0 points  What month? 0 points 0 points 0 points 0 points 0 points  What time? 0 points 0 points 0 points 0 points 0 points  Count back from 20 0 points 0 points 0 points 0 points 0 points  Months in reverse 0 points 0 points 0 points 0 points 0 points  Repeat phrase 0 points 0 points 0 points 2 points 0 points  Total Score 0 points 0 points 0 points 2 points 0 points    Immunizations Immunization History  Administered Date(s) Administered   Fluad Quad(high Dose 65+) 12/03/2018, 11/13/2019, 10/28/2020, 10/18/2021   Influenza Split 11/08/2010, 12/06/2011   Influenza Whole 11/06/2006, 11/24/2009   Influenza, High Dose Seasonal PF 11/09/2017   Influenza,inj,Quad PF,6+ Mos 10/31/2012, 01/14/2014, 11/25/2014, 10/05/2015, 11/24/2016   Moderna Sars-Covid-2 Vaccination 04/04/2019, 05/03/2019, 01/28/2020, 09/04/2020   Pneumococcal Conjugate-13 09/17/2013   Pneumococcal Polysaccharide-23 02/22/2011   Td 07/31/2007   Tdap 07/31/2007, 09/05/2017   Zoster, Live 07/31/2007    TDAP status: Up to date  Flu Vaccine status: Up to date  Pneumococcal vaccine status: Up to date  Covid-19 vaccine status: Information  provided on how to obtain vaccines.   Qualifies for Shingles Vaccine? Yes   Zostavax completed No   Shingrix Completed?: No.    Education has been provided regarding the importance of this vaccine. Patient has been advised to call insurance company to determine out of pocket expense if they have not yet received this vaccine. Advised may also receive vaccine at local pharmacy or Health Dept. Verbalized acceptance and understanding.  Screening Tests Health Maintenance  Topic Date Due   Medicare  Annual Wellness (AWV)  12/28/2021   COVID-19 Vaccine (5 - 2023-24 season) 01/27/2022 (Originally 10/08/2021)   Zoster Vaccines- Shingrix (1 of 2) 04/12/2022 (Originally 12/21/1964)   DTaP/Tdap/Td (4 - Td or Tdap) 09/06/2027   Pneumonia Vaccine 48+ Years old  Completed   INFLUENZA VACCINE  Completed   DEXA SCAN  Completed   Hepatitis C Screening  Completed   HPV VACCINES  Aged Out   COLONOSCOPY (Pts 45-26yr Insurance coverage will need to be confirmed)  Discontinued    Health Maintenance  Health Maintenance Due  Topic Date Due   Medicare Annual Wellness (AWV)  12/28/2021    Colorectal cancer screening: No longer required.   Mammogram status: Completed 04/13/20. Repeat every yearPatient holding off now that she has a port for her cancer treatment.  Bone Density status: Completed 03/08/2013. Results reflect: Bone density results: OSTEOPENIA.   Lung Cancer Screening: (Low Dose CT Chest recommended if Age 76-80years, 30 pack-year currently smoking OR have quit w/in 15years.) does not qualify.    Additional Screening:  Hepatitis C Screening: does not qualify; Completed 02/25/2015  Vision Screening: Recommended annual ophthalmology exams for early detection of glaucoma and other disorders of the eye. Is the patient up to date with their annual eye exam?  Yes  Who is the provider or what is the name of the office in which the patient attends annual eye exams? [facility] not  established with a provider, would they like to be referred to a provider to establish care? No .   Dental Screening: Recommended annual dental exams for proper oral hygiene  Community Resource Referral / Chronic Care Management: CRR required this visit?  No   CCM required this visit?  No      Plan:     I have personally reviewed and noted the following in the patient's chart:   Medical and social history Use of alcohol, tobacco or illicit drugs  Current medications and supplements including opioid prescriptions. Patient is currently taking opioid prescriptions. Information provided to patient regarding non-opioid alternatives. Patient advised to discuss non-opioid treatment plan with their provider. Functional ability and status Nutritional status Physical activity Advanced directives List of other physicians Hospitalizations, surgeries, and ER visits in previous 12 months Vitals Screenings to include cognitive, depression, and falls Referrals and appointments  In addition, I have reviewed and discussed with patient certain preventive protocols, quality metrics, and best practice recommendations. A written personalized care plan for preventive services as well as general preventive health recommendations were provided to patient.     JLorene Dy MD   01/11/2022

## 2022-01-11 NOTE — Assessment & Plan Note (Signed)
Elevated at visit, pt to take metoprolol as prescribed whic is 1.5 tb twice daily and is to reschedule with Cardiology

## 2022-01-11 NOTE — Patient Instructions (Addendum)
  Renee Waters , Thank you for taking time to come for your Medicare Wellness Visit. I appreciate your ongoing commitment to your health goals. Please review the following plan we discussed and let me know if I can assist you in the future.   These are the goals we discussed: You would like to do more activities with her children. You will follow up with Dr.Simpson to discuss anxiety.   This is a list of the screening recommended for you and due dates:  Health Maintenance  Topic Date Due   Medicare Annual Wellness Visit  12/28/2021   COVID-19 Vaccine (5 - 2023-24 season) 01/27/2022*   Zoster (Shingles) Vaccine (1 of 2) 04/12/2022*   DTaP/Tdap/Td vaccine (4 - Td or Tdap) 09/06/2027   Pneumonia Vaccine  Completed   Flu Shot  Completed   DEXA scan (bone density measurement)  Completed   Hepatitis C Screening: USPSTF Recommendation to screen - Ages 50-79 yo.  Completed   HPV Vaccine  Aged Out   Colon Cancer Screening  Discontinued  *Topic was postponed. The date shown is not the original due date.

## 2022-01-11 NOTE — Assessment & Plan Note (Signed)
Chronic and unchanged ?

## 2022-01-11 NOTE — Telephone Encounter (Signed)
Called patient and left VM to follow up on application. Asked that she call back if she needs me to resend or come in to office to sign. I will continue to follow and update.   [name], [facility] Patient [place]  [phone] (phone) [phone] (fax) 01/11/2022 8:49 AM

## 2022-01-11 NOTE — Assessment & Plan Note (Signed)
Pain management through hospice , was at pain clinic

## 2022-01-11 NOTE — Assessment & Plan Note (Signed)
Low oxygen on room air at rest, will need to re establish need for supplemental oxygen as pt reports not using it and having a lot of oxygen ather home will need to reach out to home health/hospice care team

## 2022-01-11 NOTE — Progress Notes (Unsigned)
CRITICAL VALUE STICKER  CRITICAL VALUE: WBC 115.2  DATE & TIME NOTIFIED: 01/11/22 4:01 MELISSA  MESSENGER (representative from lab): Called to notify patients WBC was in a critical range of 115.2.  MD NOTIFIED: [name]  TIME OF NOTIFICATION: 4:05  RESPONSE:  Physician made aware of critical value lab and no further instructions noted.

## 2022-01-11 NOTE — Progress Notes (Signed)
   Renee Waters     MRN: [id number]      DOB: 03-27-1945   HPI Renee Waters is here for follow up and re-evaluation of chronic medical conditions, medication management and review of any available recent lab and radiology data.  2 day h/o increased nasal congestion and cough, has had to use flutter valve also, so notes increased respiratory symptoms and is worried that she does not get sick enough to need hospaliation again. Prior to this has been doing well, enjoying family and friends , does note r that her bloood pressure has been eratic and she self adjusted her medication down as well. Missed Cardiology f/u as she had a fall, will reschedule. Daughter accompanies her and is responsible in great part for decision making, which is good She is now under hospice care , and th family is grateful for that support C/o chronic left hip pain  ROS Denies chest pains, palpitations and leg swelling Denies abdominal pain, nausea, vomiting,diarrhea or constipation.   Denies dysuria, frequency, hesitancy or incontinence. . Denies headaches, seizures, numbness, or tingling. Denies  uncontrolled depression, anxiety or insomnia. Denies skin break down or rash.   PE  BP (!) 174/84   Pulse 77   Ht '5\' 5"'$  (1.651 m)   Wt 131 lb (59.4 kg)   SpO2 91%   BMI 21.80 kg/m   Patient alert and oriented and in no cardiopulmonary distress.  HEENT: No facial asymmetry, EOMI,     Neck supple .Nasal congestion, npo sinus tenderness  Chest: decreased air entry  scattered crackles, no wheezes CVS: S1, S2 no murmurs, no S3.Regular rate.  ABD:  distended . Hepatosplenomegaly  Ext: No edema  MS: Decreased  ROM spine, shoulders, hips and knees.  Skin: Intact, no ulcerations or rash noted.  Psych: Good eye contact, normal affect.  not anxious or depressed appearing.  CNS: CN 2-12 intact, power,  normal throughout.   Assessment & Plan  Labile hypertension Elevated at visit, pt to take metoprolol as  prescribed whic is 1.5 tb twice daily and is to reschedule with Cardiology  Chronic cough Current exacerbation, with h/o prolonged pneumonia and respiratory compromise , 1 week course of septra is prescribed  Hip pain Chronic and unchanged  Generalized pain Pain management through hospice , was at pain clinic  Hypoxia Low oxygen on room air at rest, will need to re establish need for supplemental oxygen as pt reports not using it and having a lot of oxygen ather home will need to reach out to home health/hospice care team

## 2022-01-11 NOTE — Patient Instructions (Signed)
[facility]  Discharge Instructions: Thank you for choosing [facility] to provide your oncology and hematology care.  If you have a lab appointment with the [facility], please come in thru the Main Entrance and check in at the main information desk.  Wear comfortable clothing and clothing appropriate for easy access to any Portacath or PICC line.   We strive to give you quality time with your provider. You may need to reschedule your appointment if you arrive late (15 or more minutes).  Arriving late affects you and other patients whose appointments are after yours.  Also, if you miss three or more appointments without notifying the office, you may be dismissed from the clinic at the provider's discretion.      For prescription refill requests, have your pharmacy contact our office and allow 72 hours for refills to be completed.    Today you received the following chemotherapy and/or immunotherapy agents port flush with labs.      To help prevent nausea and vomiting after your treatment, we encourage you to take your nausea medication as directed.  BELOW ARE SYMPTOMS THAT SHOULD BE REPORTED IMMEDIATELY: *FEVER GREATER THAN 100.4 F (38 C) OR HIGHER *CHILLS OR SWEATING *NAUSEA AND VOMITING THAT IS NOT CONTROLLED WITH YOUR NAUSEA MEDICATION *UNUSUAL SHORTNESS OF BREATH *UNUSUAL BRUISING OR BLEEDING *URINARY PROBLEMS (pain or burning when urinating, or frequent urination) *BOWEL PROBLEMS (unusual diarrhea, constipation, pain near the anus) TENDERNESS IN MOUTH AND THROAT WITH OR WITHOUT PRESENCE OF ULCERS (sore throat, sores in mouth, or a toothache) UNUSUAL RASH, SWELLING OR PAIN  UNUSUAL VAGINAL DISCHARGE OR ITCHING   Items with * indicate a potential emergency and should be followed up as soon as possible or go to the Emergency Department if any problems should occur.  Please show the CHEMOTHERAPY ALERT CARD or IMMUNOTHERAPY ALERT CARD at check-in to  the Emergency Department and triage nurse.  Should you have questions after your visit or need to cancel or reschedule your appointment, please contact [facility] [phone]  and follow the prompts.  Office hours are 8:00 a.m. to 4:30 p.m. Monday - Friday. Please note that voicemails left after 4:00 p.m. may not be returned until the following business day.  We are closed weekends and major holidays. You have access to a nurse at all times for urgent questions. Please call the main number to the clinic [phone] and follow the prompts.  For any non-urgent questions, you may also contact your provider using MyChart. We now offer e-Visits for anyone 21 and older to request care online for non-urgent symptoms. For details visit [email or web address].   Also download the MyChart app! Go to the app store, search "MyChart", open the app, select [facility], and log in with your MyChart username and password.  Masks are optional in the cancer centers. If you would like for your care team to wear a mask while they are taking care of you, please let them know. You may have one support person who is at least 76 years old accompany you for your appointments.

## 2022-01-12 LAB — PREPARE RBC (CROSSMATCH)

## 2022-01-12 MED ORDER — OCTREOTIDE ACETATE 30 MG IM KIT
PACK | INTRAMUSCULAR | Status: AC
  Filled 2022-01-12: qty 1

## 2022-01-12 MED ORDER — LANREOTIDE ACETATE 120 MG/0.5ML ~~LOC~~ SOLN
SUBCUTANEOUS | Status: AC
  Filled 2022-01-12: qty 120

## 2022-01-12 NOTE — Addendum Note (Signed)
Addended by: Donnie Aho on: 01/12/2022 10:11 AM   Modules accepted: Orders

## 2022-01-12 NOTE — Progress Notes (Signed)
Hemoglobin was 7.8 yesterday. Will set up patient for transfusion tomorrow per orders from [name].

## 2022-01-13 ENCOUNTER — Inpatient Hospital Stay: Payer: Medicare Other

## 2022-01-13 DIAGNOSIS — D471 Chronic myeloproliferative disease: Secondary | ICD-10-CM

## 2022-01-13 DIAGNOSIS — E876 Hypokalemia: Secondary | ICD-10-CM | POA: Diagnosis not present

## 2022-01-13 DIAGNOSIS — Z79622 Long term (current) use of janus kinase inhibitor: Secondary | ICD-10-CM | POA: Diagnosis not present

## 2022-01-13 DIAGNOSIS — N189 Chronic kidney disease, unspecified: Secondary | ICD-10-CM | POA: Diagnosis not present

## 2022-01-13 MED ORDER — DIPHENHYDRAMINE HCL 25 MG PO CAPS
25.0000 mg | ORAL_CAPSULE | Freq: Once | ORAL | Status: AC
  Administered 2022-01-13: 25 mg via ORAL
  Filled 2022-01-13: qty 1

## 2022-01-13 MED ORDER — HEPARIN SOD (PORK) LOCK FLUSH 100 UNIT/ML IV SOLN
500.0000 [IU] | Freq: Every day | INTRAVENOUS | Status: AC | PRN
  Administered 2022-01-13: 500 [IU]

## 2022-01-13 MED ORDER — SODIUM CHLORIDE 0.9% FLUSH
10.0000 mL | INTRAVENOUS | Status: AC | PRN
  Administered 2022-01-13: 10 mL

## 2022-01-13 MED ORDER — ACETAMINOPHEN 325 MG PO TABS
650.0000 mg | ORAL_TABLET | Freq: Once | ORAL | Status: AC
  Administered 2022-01-13: 650 mg via ORAL
  Filled 2022-01-13: qty 2

## 2022-01-13 MED ORDER — SODIUM CHLORIDE 0.9% IV SOLUTION
250.0000 mL | Freq: Once | INTRAVENOUS | Status: AC
  Administered 2022-01-13: 250 mL via INTRAVENOUS

## 2022-01-13 NOTE — Progress Notes (Signed)
Patient tolerated blood transfusion with no complaints voiced. Side effects with management reviewed with understanding verbalized. Port site clean and dry with no bruising or swelling noted at site. Good blood return noted before and after administration of therapy. Band aid applied. Patient's BP 74/62 [name] made aware, patient okay for discharge. Patient left in satisfactory condition with VSS and no s/s of distress noted.

## 2022-01-13 NOTE — Telephone Encounter (Signed)
Oral Chemotherapy Pharmacist Encounter  Patient had not returned mfg assistance paperwork to patient advocate Renee Waters after he contacted her on 01/06/22. Renee Waters was able to coordinated for paperwork to be sign today during her office visit. Patient's Ojjaara acess/start is pending the mfg assistance approval.   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    Pt is doing well. Counseled patient on administration, dosing, side effects, monitoring, drug-food interactions, safe handling, storage, and disposal. Patient will take 1 tablet (100 mg) by mouth daily.  Side effects include but not limited to: rash, diarrhea, nausea.    Reviewed with patient importance of keeping a medication schedule and plan for any missed doses.  After discussion with patient no patient barriers to medication adherence identified.   Renee Waters voiced understanding and appreciation. All questions answered. Medication handout provided.  Provided patient with Oral [facility] phone number. Patient knows to call the office with questions or concerns. Oral Chemotherapy Navigation Clinic will continue to follow.  Darl Pikes, PharmD, BCPS, BCOP, CPP Hematology/Oncology Clinical Pharmacist Practitioner [place]/[facility]/AP [facility] [phone]  01/13/2022 2:38 PM

## 2022-01-13 NOTE — Telephone Encounter (Signed)
Oral Oncology Patient Advocate Encounter   Submitted application for assistance for Ojjaara to [facility].   Application submitted via e-fax to [phone]   [facility]'s phone number [phone].   I will continue to check the status until final determination.   [name], [facility] Patient [place]  [phone] (phone) [phone] (fax) 01/13/2022 12:08 PM

## 2022-01-13 NOTE — Telephone Encounter (Signed)
Patient had appointment at MD office today 12/07. Contacted MD office and sent application to them to have patient sign while in office. I will submit once received back from office.   [name], [facility] Patient [place]  [phone] (phone) [phone] (fax) 01/13/2022 10:25 AM

## 2022-01-13 NOTE — Patient Instructions (Signed)
[facility]  Discharge Instructions: Thank you for choosing [facility] to provide your oncology and hematology care.  If you have a lab appointment with the Cancer Center, please come in thru the Main Entrance and check in at the main information desk.  Wear comfortable clothing and clothing appropriate for easy access to any Portacath or PICC line.   We strive to give you quality time with your provider. You may need to reschedule your appointment if you arrive late (15 or more minutes).  Arriving late affects you and other patients whose appointments are after yours.  Also, if you miss three or more appointments without notifying the office, you may be dismissed from the clinic at the provider's discretion.      For prescription refill requests, have your pharmacy contact our office and allow 72 hours for refills to be completed.    Today you received the following 1 unit of PRBCs, return as scheduled.   To help prevent nausea and vomiting after your treatment, we encourage you to take your nausea medication as directed.  BELOW ARE SYMPTOMS THAT SHOULD BE REPORTED IMMEDIATELY: *FEVER GREATER THAN 100.4 F (38 C) OR HIGHER *CHILLS OR SWEATING *NAUSEA AND VOMITING THAT IS NOT CONTROLLED WITH YOUR NAUSEA MEDICATION *UNUSUAL SHORTNESS OF BREATH *UNUSUAL BRUISING OR BLEEDING *URINARY PROBLEMS (pain or burning when urinating, or frequent urination) *BOWEL PROBLEMS (unusual diarrhea, constipation, pain near the anus) TENDERNESS IN MOUTH AND THROAT WITH OR WITHOUT PRESENCE OF ULCERS (sore throat, sores in mouth, or a toothache) UNUSUAL RASH, SWELLING OR PAIN  UNUSUAL VAGINAL DISCHARGE OR ITCHING   Items with * indicate a potential emergency and should be followed up as soon as possible or go to the Emergency Department if any problems should occur.  Please show the CHEMOTHERAPY ALERT CARD or IMMUNOTHERAPY ALERT CARD at check-in to the Emergency Department  and triage nurse.  Should you have questions after your visit or need to cancel or reschedule your appointment, please contact [facility] [phone]  and follow the prompts.  Office hours are 8:00 a.m. to 4:30 p.m. Monday - Friday. Please note that voicemails left after 4:00 p.m. may not be returned until the following business day.  We are closed weekends and major holidays. You have access to a nurse at all times for urgent questions. Please call the main number to the clinic [phone] and follow the prompts.  For any non-urgent questions, you may also contact your provider using MyChart. We now offer e-Visits for anyone 18 and older to request care online for non-urgent symptoms. For details visit [email or web address].   Also download the MyChart app! Go to the app store, search "MyChart", open the app, select [facility], and log in with your MyChart username and password.  Masks are optional in the cancer centers. If you would like for your care team to wear a mask while they are taking care of you, please let them know. You may have one support person who is at least 76 years old accompany you for your appointments.  

## 2022-01-14 LAB — TYPE AND SCREEN
ABO/RH(D): O POS
Antibody Screen: POSITIVE
DAT, IgG: NEGATIVE
Donor AG Type: NEGATIVE
Unit division: 0

## 2022-01-14 LAB — BPAM RBC
Blood Product Expiration Date: 202312172359
ISSUE DATE / TIME: 202312071826
Unit Type and Rh: 9500

## 2022-01-17 ENCOUNTER — Ambulatory Visit: Payer: Medicare Other | Attending: Internal Medicine | Admitting: Internal Medicine

## 2022-01-17 ENCOUNTER — Encounter: Payer: Self-pay | Admitting: Internal Medicine

## 2022-01-17 VITALS — BP 128/82 | HR 65 | Ht 65.0 in | Wt 129.4 lb

## 2022-01-17 DIAGNOSIS — R0989 Other specified symptoms and signs involving the circulatory and respiratory systems: Secondary | ICD-10-CM | POA: Diagnosis not present

## 2022-01-17 NOTE — Progress Notes (Signed)
Cardiology Office Note:    Date:  01/17/2022   ID:  Renee Waters, DOB Jun 22, 1945, MRN [id number]  PCP:  [name], MD   [place] Providers Cardiologist:  Janina Mayo, MD     Referring MD: [name], MD   No chief complaint on file. Hypotension  History of Present Illness:    Renee Waters is a 76 y.o. female with a hx of anxiety, CKD stage 3b, lupus, referral for labile blood pressures and possible ????HFpeF  She was sent to the hospital from oncology clinic 2/2 weakness 10/19/2021. She had ?UTI and was on antibiotics. At this time WBC 115, Hgb 6.7 mg/dL, plt 108. Cxray showed some congestion. She was managed with IV ceftriaxone.  She was diagnosed with dehydration. She had some bradycardia and the thought was that this was contributing to her weakness. She had sinus bradycardia in the 40s. Her daughter notes that she had to ask for tests to be done based on symptoms of productive cough. She had sputum cultures sent. They came back 10/26/2021. She had a sputum culture that was + pseudomonas 10/26/2021 (see sensitivities). She was managed on augmentin. Her blood pressures last month got as low as the 60Y systolics. She was seen in clinic with her PCP in late September and WbC 154.  Hgb 8.6, platelets 103, abs neutrophil count were elevated form prior. She had diarrhea and augment was stopped.She was referred to cardiology with concern for labile Bps.   Today, she reports persistent diarrhea and thick yellow sputum/ she says it looks like over cooked oatmeal. No hemoptysis. No fevers. No chills. No dysuria. Last night she had a hard time breathing. She doesn't want to go to the hospital.    She has no LE edema. Notes SOB/chest tightness with productive cough and URI symptoms.  Interim hx 01/17/2022 Last visit she had PNA + pseudomonas. Managed with IV meropenem and PO bactrim. She is s/p antibiotics. She feels much better today. She denies  SOB/PND/orthopnea. No LE edema  TTE 10/20/2021 Normal EF Grade II DD RV fxn is normal LA dilation Mild MR Mild-Modatate AR AS Mean gradient 12 mmHg No pulmonary HTN    Past Medical History:  Diagnosis Date   Acute cholangitis    Allergy    Anemia    Anxiety    Arthritis    Phreesia 03/18/2020   Barrett's esophagus    Cataract    Chronic back pain    Chronic neck pain    CKD (chronic kidney disease) stage 3, GFR 30-59 ml/min ([place]) 10/29/2020   Depression    Genital herpes    GERD (gastroesophageal reflux disease)    H/O degenerative disc disease    History of blood transfusion    Hypertension    Insomnia    Lupus (systemic lupus erythematosus) ([place])    Neuromuscular disorder ([place])    Osteoarthritis    S/P colonoscopy June 2005   normal, no polyps   S/P endoscopy June 2005, Oct 2009   2005: short-segment Barrett's, 2009: short-segment Barrett's   Upper respiratory tract infection 07/09/2020   UTI (lower urinary tract infection) 11/2012    Past Surgical History:  Procedure Laterality Date   ABDOMINAL HYSTERECTOMY     BACK SURGERY     BIOPSY N/A 03/20/2014   Procedure: BIOPSY;  Surgeon: Daneil Dolin, MD;  Location: [facility];  Service: Endoscopy;  Laterality: N/A;   BIOPSY  09/14/2015   Procedure: BIOPSY;  Surgeon: Herbie Baltimore  Hilton Cork, MD;  [facility]: [facility];  Service: Endoscopy;;  esophageal and gastric   BIOPSY  07/23/2019   Procedure: BIOPSY;  Surgeon: Daneil Dolin, MD;  [facility];  Service: Endoscopy;;  esophageal    CARPAL TUNNEL RELEASE Left 2013   cervical disectomy  2002   CESAREAN SECTION N/A    Phreesia 03/18/2020   CHOLECYSTECTOMY     with lysis of adhesions for sbo; "ruptured gallbladder".   COLONOSCOPY  11/09/2011   RMR: Melanosis coli   COLONOSCOPY WITH PROPOFOL N/A 09/14/2015   Dr. Gala Romney: diverticulosis, 4m TA removed. next TCS 09/2020.    COLONOSCOPY WITH PROPOFOL N/A 04/30/2020   Procedure: COLONOSCOPY WITH PROPOFOL;  Surgeon: RDaneil Dolin MD;  [facility];  Service: Endoscopy;  Laterality: N/A;  PM (ASA 3)   DENTAL SURGERY  11/2015   multiple tooth extraction   ESOPHAGOGASTRODUODENOSCOPY  11/29/2007   salmon-colored  tongue   longest stable at  3 cm, distal esophagus as described previously status post biopsy/ Hiatal hernia, otherwise normal stomach D1 and D2   ESOPHAGOGASTRODUODENOSCOPY  01/06/11   short segment Barrett's esophagus s/p bx/Hiatal hernia   ESOPHAGOGASTRODUODENOSCOPY (EGD) WITH PROPOFOL N/A 03/20/2014   REHO:ZYYQMGNOdistal esophagus short segment barrett's, bx with no dysplasia. next egd in 03/2017   ESOPHAGOGASTRODUODENOSCOPY (EGD) WITH PROPOFOL N/A 09/14/2015   Dr. RGala Romney Barrett's without dysplasia, gastritis benign bx, hiatal hernia. next EGD 09/2018.   ESOPHAGOGASTRODUODENOSCOPY (EGD) WITH PROPOFOL N/A 07/23/2019   Procedure: ESOPHAGOGASTRODUODENOSCOPY (EGD) WITH PROPOFOL;  Surgeon: RDaneil Dolin MD;  [facility];  Service: Endoscopy;  Laterality: N/A;  3:00pm   EYE SURGERY N/A    Phreesia 03/18/2020   HERNIA REPAIR Right 07/2010   Dr. BZada Girt  IR IMAGING GUIDED PORT INSERTION  02/09/2021   JOINT REPLACEMENT     L[facility]  POLYPECTOMY  09/14/2015   Procedure: POLYPECTOMY;  Surgeon: RDaneil Dolin MD;  [facility];  Service: Endoscopy;;  ascending colon   right hip replacement  07/2010   went back in sept 2012 to fix   SHOULDER ARTHROSCOPY  2008   left   SPINE SURGERY N/A    Phreesia 03/18/2020   TOTAL HIP REVISION Right 12/17/2012   Procedure: RIGHT TOTAL HIP REVISION;  Surgeon: MMauri Pole MD;  Location: [facility];  Service: Orthopedics;  Laterality: Right;   WRIST SURGERY Right 2011   open reduction right wrist.    Current Medications: Current Outpatient Medications on File Prior to Visit  Medication Sig Dispense Refill   acyclovir (ZOVIRAX) 800 MG tablet Take 1 tablet (800 mg total) by mouth 5 (five) times  daily. 50 tablet 1   acyclovir ointment (ZOVIRAX) 5 % Apply 1 application. topically every 3 (three) hours. 15 g 1   allopurinol (ZYLOPRIM) 300 MG tablet Take 1 tablet (300 mg total) by mouth daily. 30 tablet 3   busPIRone (BUSPAR) 5 MG tablet Take one tablet by mouth once daily, as needed, for anxiety 30 tablet 2   cetirizine (ZYRTEC) 10 MG tablet Take 10 mg by mouth daily.     citalopram (CELEXA) 20 MG tablet Take 1 tablet by mouth once daily 30 tablet 0   EPINEPHrine 0.3 mg/0.3 mL IJ SOAJ injection INJECT 0.3 MLS INTO MUSCLE ONCE AS NEEDED 1 each 2   furosemide (LASIX) 20 MG tablet TAKE 1 TABLET BY MOUTH ONCE DAILY AS NEEDED 30 tablet  3   gabapentin (NEURONTIN) 300 MG capsule Take one capsule by mouth once daily, as needed, for pain (Patient taking differently: Take 300 mg by mouth daily as needed (Pain).) 90 capsule 1   hydrOXYzine (ATARAX) 25 MG tablet Take 12.5-25 mg by mouth 2 (two) times daily as needed.     lidocaine-prilocaine (EMLA) cream Apply 1 Application topically as needed (Access to port and pain). 30 g 6   metoprolol tartrate (LOPRESSOR) 25 MG tablet Take one and a half tablets twice daily 180 tablet 3   mirtazapine (REMERON) 15 MG tablet TAKE 1 TABLET BY MOUTH AT BEDTIME 30 tablet 0   Momelotinib Dihydrochloride (OJJAARA) 100 MG TABS Take 100 mg by mouth daily.     Multiple Vitamin (MULTIVITAMIN WITH MINERALS) TABS tablet Take 1 tablet by mouth every other day.     oxyCODONE (OXY IR/ROXICODONE) 5 MG immediate release tablet Take 5 mg by mouth every 4 (four) hours as needed for moderate pain, severe pain or breakthrough pain.     pantoprazole (PROTONIX) 40 MG tablet Take 1 tablet (40 mg total) by mouth 2 (two) times daily before a meal. 60 tablet 0   potassium chloride (KLOR-CON) 10 MEQ tablet Take 1 tablet by mouth once daily 30 tablet 4   prochlorperazine (COMPAZINE) 10 MG tablet Take 1 tablet (10 mg total) by mouth every 6 (six) hours as needed for nausea or vomiting. 60 tablet  1   sulfamethoxazole-trimethoprim (BACTRIM DS) 800-160 MG tablet Take 1 tablet by mouth 2 (two) times daily. 14 tablet 0   temazepam (RESTORIL) 30 MG capsule Take 1 capsule (30 mg total) by mouth at bedtime as needed for sleep. 30 capsule 5   tiZANidine (ZANAFLEX) 4 MG tablet Take by mouth.     albuterol (PROVENTIL) (2.5 MG/3ML) 0.083% nebulizer solution Take 3 mLs (2.5 mg total) by nebulization every 4 (four) hours as needed for wheezing or shortness of breath. (Patient not taking: Reported on 01/17/2022) 75 mL 12   albuterol (VENTOLIN HFA) 108 (90 Base) MCG/ACT inhaler Inhale 2 puffs into the lungs every 4 (four) hours as needed for wheezing or shortness of breath. (Patient not taking: Reported on 01/17/2022) 8 g 11   budesonide-formoterol (SYMBICORT) 80-4.5 MCG/ACT inhaler Take 2 puffs first thing in am and then another 2 puffs about 12 hours later. (Patient not taking: Reported on 01/17/2022) 10.2 g 11   Current Facility-Administered Medications on File Prior to Visit  Medication Dose Route Frequency Provider Last Rate Last Admin   lanreotide acetate (SOMATULINE DEPOT) 120 MG/0.5ML injection            lanreotide acetate (SOMATULINE DEPOT) 120 MG/0.5ML injection            lanreotide acetate (SOMATULINE DEPOT) 120 MG/0.5ML injection            octreotide (SANDOSTATIN LAR) 30 MG IM injection            octreotide (SANDOSTATIN LAR) 30 MG IM injection              Allergies:   Bee venom, Norvasc [amlodipine], Tylenol [acetaminophen], Hydralazine, Red dye, Percocet [oxycodone-acetaminophen], and Tyloxapol   Social History   Socioeconomic History   Marital status: Married    Spouse name: louis   Number of children: 4   Years of education: 12+   Highest education level: Some college, no degree  Occupational History   Occupation: Press photographer  - retired   Occupation: non profit  Tobacco Use  Smoking status: Former    Packs/day: 0.25    Years: 25.00    Total pack years: 6.25    Types:  Cigarettes    Quit date: 02/07/2003    Years since quitting: 18.9   Smokeless tobacco: Never   Tobacco comments:    quit in 2004  Vaping Use   Vaping Use: Never used  Substance and Sexual Activity   Alcohol use: No   Drug use: No   Sexual activity: Not Currently    Birth control/protection: Surgical  Other Topics Concern   Not on file  Social History Narrative   Not on file   Social Determinants of Health   Financial Resource Strain: Low Risk  (12/28/2020)   Overall Financial Resource Strain (CARDIA)    Difficulty of Paying Living Expenses: Not hard at all  Food Insecurity: No Food Insecurity (11/11/2021)   Hunger Vital Sign    Worried About Running Out of Food in the Last Year: Never true    Ran Out of Food in the Last Year: Never true  Transportation Needs: No Transportation Needs (11/11/2021)   PRAPARE - Hydrologist (Medical): No    Lack of Transportation (Non-Medical): No  Physical Activity: Inactive (04/23/2021)   Exercise Vital Sign    Days of Exercise per Week: 0 days    Minutes of Exercise per Session: 0 min  Stress: Stress Concern Present (04/23/2021)   [place]    Feeling of Stress : Rather much  Social Connections: Moderately Integrated (12/28/2020)   Social Connection and Isolation Panel [NHANES]    Frequency of Communication with Friends and Family: More than three times a week    Frequency of Social Gatherings with Friends and Family: Once a week    Attends Religious Services: More than 4 times per year    Active Member of [employer] or Organizations: No    Attends Archivist Meetings: Never    Marital Status: Married     Family History: The patient's family history includes Hypertension in her mother; Stroke in her mother. There is no history of Colon cancer, Anesthesia problems, Hypotension, Malignant hyperthermia, Pseudochol deficiency, Gastric cancer, or  Esophageal cancer.  ROS:   Please see the history of present illness.     All other systems reviewed and are negative.  EKGs/Labs/Other Studies Reviewed:    The following studies were reviewed today:   EKG:  EKG is  ordered today.  The ekg ordered today demonstrates   11/10/2021- sinus bradycardia HR 57  Recent Labs: 09/01/2021: TSH 1.158 11/10/2021: B Natriuretic Peptide 168.7 01/04/2022: ALT 36; BUN 24; Creatinine, Ser 1.23; Magnesium 1.8; Potassium 4.2; Sodium 140 01/11/2022: Hemoglobin 7.8; Platelets 140   Recent Lipid Panel    Component Value Date/Time   CHOL 95 (L) 10/05/2020 0959   TRIG 87 10/05/2020 0959   HDL 27 (L) 10/05/2020 0959   CHOLHDL 3.5 10/05/2020 0959   CHOLHDL 3.0 11/03/2014 1003   VLDL 12 11/03/2014 1003   LDLCALC 51 10/05/2020 0959     Risk Assessment/Calculations:     Physical Exam:    VS:  Vitals:   01/17/22 1314  BP: 128/82  Pulse: 65  SpO2: 94%     Wt Readings from Last 3 Encounters:  01/17/22 129 lb 6.4 oz (58.7 kg)  01/07/22 131 lb (59.4 kg)  01/04/22 130 lb 9.6 oz (59.2 kg)     GEN:  Well nourished, well  developed in no acute distress HEENT: Normal NECK: No JVD; No carotid bruits LYMPHATICS: No lymphadenopathy CARDIAC: RRR, no murmurs, rubs, gallops RESPIRATORY:  Clear to auscultation without rales, wheezing or rhonchi  ABDOMEN: Soft, non-tender, non-distended MUSCULOSKELETAL:  No edema; No deformity  SKIN: Warm and dry NEUROLOGIC:  Alert and oriented x 3 PSYCHIATRIC:  Normal affect   ASSESSMENT:   Mild-Moderate AI/ Mild AS:  PHT 447 msec. Mean gradient of 12 mmHg. She is asymptomatic. Can monitor with surveillance  ?HFpEF: no signs of CHF.  HTN: Bps may fluctuate, if low would assess for other causes (e.g. infection). Otherwise, discussed that if she is getting consistent high blood pressures over a week or so, to let us know. Also discussed rechecking her blood pressure, if it is high.  PLAN:    In order of  problems listed above:   Follow up 6 months           Medication Adjustments/Labs and Tests Ordered: Current medicines are reviewed at length with the patient today.  Concerns regarding medicines are outlined above.  No orders of the defined types were placed in this encounter.  No orders of the defined types were placed in this encounter.   Patient Instructions  Medication Instructions:  No Changes In Medications at this time.  *If you need a refill on your cardiac medications before your next appointment, please call your pharmacy*  Lab Work: None Ordered At This Time.  If you have labs (blood work) drawn today and your tests are completely normal, you will receive your results only by: [place] (if you have MyChart) OR A paper copy in the mail If you have any lab test that is abnormal or we need to change your treatment, we will call you to review the results.  Testing/Procedures: None Ordered At This Time.   Follow-Up: At [facility], you and your health needs are our priority.  As part of our continuing mission to provide you with exceptional heart care, we have created designated Provider Care Teams.  These Care Teams include your primary Cardiologist (physician) and Advanced Practice Providers (APPs -  Physician Assistants and Nurse Practitioners) who all work together to provide you with the care you need, when you need it.  Your next appointment:   6 month(s)  The format for your next appointment:   In Person  Provider:   Janina Mayo, MD            Signed, Janina Mayo, MD  01/17/2022 1:44 PM    [place]

## 2022-01-17 NOTE — Telephone Encounter (Signed)
Received notification that a signature was missing from MD. Durene Cal to MD office for signature and submission. I will continue to follow and update.  [name], [facility] Patient [place]  [phone] (phone) [phone] (fax) 01/17/2022 9:23 AM

## 2022-01-17 NOTE — Patient Instructions (Signed)
Medication Instructions:  No Changes In Medications at this time.  *If you need a refill on your cardiac medications before your next appointment, please call your pharmacy*  Lab Work: None Ordered At This Time.  If you have labs (blood work) drawn today and your tests are completely normal, you will receive your results only by: [place] (if you have MyChart) OR A paper copy in the mail If you have any lab test that is abnormal or we need to change your treatment, we will call you to review the results.  Testing/Procedures: None Ordered At This Time.   Follow-Up: At [facility], you and your health needs are our priority.  As part of our continuing mission to provide you with exceptional heart care, we have created designated Provider Care Teams.  These Care Teams include your primary Cardiologist (physician) and Advanced Practice Providers (APPs -  Physician Assistants and Nurse Practitioners) who all work together to provide you with the care you need, when you need it.  Your next appointment:   6 month(s)  The format for your next appointment:   In Person  Provider:   Janina Mayo, MD

## 2022-01-18 ENCOUNTER — Inpatient Hospital Stay: Payer: Medicare Other

## 2022-01-18 DIAGNOSIS — D471 Chronic myeloproliferative disease: Secondary | ICD-10-CM

## 2022-01-18 DIAGNOSIS — Z79622 Long term (current) use of janus kinase inhibitor: Secondary | ICD-10-CM | POA: Diagnosis not present

## 2022-01-18 DIAGNOSIS — E876 Hypokalemia: Secondary | ICD-10-CM | POA: Diagnosis not present

## 2022-01-18 DIAGNOSIS — D649 Anemia, unspecified: Secondary | ICD-10-CM

## 2022-01-18 DIAGNOSIS — N189 Chronic kidney disease, unspecified: Secondary | ICD-10-CM | POA: Diagnosis not present

## 2022-01-18 LAB — CBC WITH DIFFERENTIAL/PLATELET
Abs Immature Granulocytes: 39.7 10*3/uL — ABNORMAL HIGH (ref 0.00–0.07)
Band Neutrophils: 4 %
Basophils Absolute: 0 10*3/uL (ref 0.0–0.1)
Basophils Relative: 0 %
Eosinophils Absolute: 0 10*3/uL (ref 0.0–0.5)
Eosinophils Relative: 0 %
HCT: 27.7 % — ABNORMAL LOW (ref 36.0–46.0)
Hemoglobin: 9.1 g/dL — ABNORMAL LOW (ref 12.0–15.0)
Lymphocytes Relative: 1 %
Lymphs Abs: 1.3 10*3/uL (ref 0.7–4.0)
MCH: 30.1 pg (ref 26.0–34.0)
MCHC: 32.9 g/dL (ref 30.0–36.0)
MCV: 91.7 fL (ref 80.0–100.0)
Metamyelocytes Relative: 10 %
Monocytes Absolute: 5.1 10*3/uL — ABNORMAL HIGH (ref 0.1–1.0)
Monocytes Relative: 4 %
Myelocytes: 10 %
Neutro Abs: 82 10*3/uL — ABNORMAL HIGH (ref 1.7–7.7)
Neutrophils Relative %: 60 %
Platelets: 193 10*3/uL (ref 150–400)
Promyelocytes Relative: 11 %
RBC: 3.02 MIL/uL — ABNORMAL LOW (ref 3.87–5.11)
RDW: 24.5 % — ABNORMAL HIGH (ref 11.5–15.5)
WBC: 128.2 10*3/uL (ref 4.0–10.5)
nRBC: 3.2 % — ABNORMAL HIGH (ref 0.0–0.2)

## 2022-01-18 LAB — COMPREHENSIVE METABOLIC PANEL
ALT: 32 U/L (ref 0–44)
AST: 50 U/L — ABNORMAL HIGH (ref 15–41)
Albumin: 3.5 g/dL (ref 3.5–5.0)
Alkaline Phosphatase: 281 U/L — ABNORMAL HIGH (ref 38–126)
Anion gap: 7 (ref 5–15)
BUN: 20 mg/dL (ref 8–23)
CO2: 21 mmol/L — ABNORMAL LOW (ref 22–32)
Calcium: 8.8 mg/dL — ABNORMAL LOW (ref 8.9–10.3)
Chloride: 111 mmol/L (ref 98–111)
Creatinine, Ser: 1.3 mg/dL — ABNORMAL HIGH (ref 0.44–1.00)
GFR, Estimated: 43 mL/min — ABNORMAL LOW (ref >60)
Glucose, Bld: 120 mg/dL — ABNORMAL HIGH (ref 70–99)
Potassium: 4.1 mmol/L (ref 3.5–5.1)
Sodium: 139 mmol/L (ref 135–145)
Total Bilirubin: 0.6 mg/dL (ref 0.3–1.2)
Total Protein: 7.6 g/dL (ref 6.5–8.1)

## 2022-01-18 LAB — MAGNESIUM: Magnesium: 1.8 mg/dL (ref 1.7–2.4)

## 2022-01-18 LAB — LACTATE DEHYDROGENASE: LDH: 934 U/L — ABNORMAL HIGH (ref 98–192)

## 2022-01-18 LAB — SAMPLE TO BLOOD BANK

## 2022-01-18 LAB — URIC ACID: Uric Acid, Serum: 3.4 mg/dL (ref 2.5–7.1)

## 2022-01-18 MED ORDER — HEPARIN SOD (PORK) LOCK FLUSH 100 UNIT/ML IV SOLN
500.0000 [IU] | Freq: Once | INTRAVENOUS | Status: AC
  Administered 2022-01-18: 500 [IU] via INTRAVENOUS

## 2022-01-18 MED ORDER — SODIUM CHLORIDE 0.9% FLUSH
10.0000 mL | Freq: Once | INTRAVENOUS | Status: AC
  Administered 2022-01-18: 10 mL via INTRAVENOUS

## 2022-01-18 NOTE — Patient Instructions (Signed)
[facility]  Discharge Instructions: Thank you for choosing [facility] to provide your oncology and hematology care.  If you have a lab appointment with the Cancer Center, please come in thru the Main Entrance and check in at the main information desk.  Wear comfortable clothing and clothing appropriate for easy access to any Portacath or PICC line.   We strive to give you quality time with your provider. You may need to reschedule your appointment if you arrive late (15 or more minutes).  Arriving late affects you and other patients whose appointments are after yours.  Also, if you miss three or more appointments without notifying the office, you may be dismissed from the clinic at the provider's discretion.      For prescription refill requests, have your pharmacy contact our office and allow 72 hours for refills to be completed.    Today you received the following chemotherapy and/or immunotherapy agents port flush.      To help prevent nausea and vomiting after your treatment, we encourage you to take your nausea medication as directed.  BELOW ARE SYMPTOMS THAT SHOULD BE REPORTED IMMEDIATELY: *FEVER GREATER THAN 100.4 F (38 C) OR HIGHER *CHILLS OR SWEATING *NAUSEA AND VOMITING THAT IS NOT CONTROLLED WITH YOUR NAUSEA MEDICATION *UNUSUAL SHORTNESS OF BREATH *UNUSUAL BRUISING OR BLEEDING *URINARY PROBLEMS (pain or burning when urinating, or frequent urination) *BOWEL PROBLEMS (unusual diarrhea, constipation, pain near the anus) TENDERNESS IN MOUTH AND THROAT WITH OR WITHOUT PRESENCE OF ULCERS (sore throat, sores in mouth, or a toothache) UNUSUAL RASH, SWELLING OR PAIN  UNUSUAL VAGINAL DISCHARGE OR ITCHING   Items with * indicate a potential emergency and should be followed up as soon as possible or go to the Emergency Department if any problems should occur.  Please show the CHEMOTHERAPY ALERT CARD or IMMUNOTHERAPY ALERT CARD at check-in to the  Emergency Department and triage nurse.  Should you have questions after your visit or need to cancel or reschedule your appointment, please contact [name] [phone]  and follow the prompts.  Office hours are 8:00 a.m. to 4:30 p.m. Monday - Friday. Please note that voicemails left after 4:00 p.m. may not be returned until the following business day.  We are closed weekends and major holidays. You have access to a nurse at all times for urgent questions. Please call the main number to the clinic [phone] and follow the prompts.  For any non-urgent questions, you may also contact your provider using MyChart. We now offer e-Visits for anyone 18 and older to request care online for non-urgent symptoms. For details visit [email or web address].   Also download the MyChart app! Go to the app store, search "MyChart", open the app, select [facility], and log in with your MyChart username and password.  Masks are optional in the cancer centers. If you would like for your care team to wear a mask while they are taking care of you, please let them know. You may have one support person who is at least 76 years old accompany you for your appointments.  

## 2022-01-18 NOTE — Progress Notes (Signed)
CRITICAL VALUE ALERT Critical value received:  WBC 128.2 Date of notification:  01-18-22 Time of notification: 1300 Critical value read back:  Yes.   Nurse who received alert:  C. Halayna Blane RN MD notified time and response:  [name], 1312, no new orders at this time

## 2022-01-18 NOTE — Telephone Encounter (Signed)
Re-submitted application to [facility] with MD Signature. I will continue to follow and update.   Renee Waters, [name] [name]  [phone] (phone) [phone] (fax) 01/18/2022 3:16 PM

## 2022-01-19 ENCOUNTER — Inpatient Hospital Stay: Payer: Medicare Other

## 2022-01-19 NOTE — Telephone Encounter (Signed)
Received notification from [place] that application processing. I will continue to follow and update.   [name], [facility] Patient [place]  [phone] (phone) [phone] (fax) 01/19/2022 3:07 PM

## 2022-01-20 NOTE — Telephone Encounter (Addendum)
Oral Oncology Patient Advocate Encounter   Received notification that the application for assistance for Ojjaara through [place] has been approved.   [employer]'s phone number [phone].   Effective dates: 01/20/22 through 02/06/22  I have spoken to the patient.  [name], [facility] Patient [place]  [phone] (phone) [phone] (fax) 01/20/2022 3:07 PM

## 2022-01-26 ENCOUNTER — Inpatient Hospital Stay: Payer: Medicare Other

## 2022-01-26 ENCOUNTER — Inpatient Hospital Stay (HOSPITAL_BASED_OUTPATIENT_CLINIC_OR_DEPARTMENT_OTHER): Payer: Medicare Other | Admitting: Hematology

## 2022-01-26 VITALS — BP 109/53 | HR 61 | Temp 98.6°F | Resp 16 | Wt 131.4 lb

## 2022-01-26 DIAGNOSIS — D72829 Elevated white blood cell count, unspecified: Secondary | ICD-10-CM

## 2022-01-26 DIAGNOSIS — N189 Chronic kidney disease, unspecified: Secondary | ICD-10-CM | POA: Diagnosis not present

## 2022-01-26 DIAGNOSIS — D649 Anemia, unspecified: Secondary | ICD-10-CM

## 2022-01-26 DIAGNOSIS — D471 Chronic myeloproliferative disease: Secondary | ICD-10-CM

## 2022-01-26 DIAGNOSIS — Z79622 Long term (current) use of janus kinase inhibitor: Secondary | ICD-10-CM | POA: Diagnosis not present

## 2022-01-26 DIAGNOSIS — E876 Hypokalemia: Secondary | ICD-10-CM | POA: Diagnosis not present

## 2022-01-26 LAB — CBC WITH DIFFERENTIAL/PLATELET
Abs Immature Granulocytes: 24.4 K/uL — ABNORMAL HIGH (ref 0.00–0.07)
Basophils Absolute: 0 K/uL (ref 0.0–0.1)
Basophils Relative: 0 %
Eosinophils Absolute: 2 K/uL — ABNORMAL HIGH (ref 0.0–0.5)
Eosinophils Relative: 2 %
HCT: 25 % — ABNORMAL LOW (ref 36.0–46.0)
Hemoglobin: 8.1 g/dL — ABNORMAL LOW (ref 12.0–15.0)
Lymphocytes Relative: 2 %
Lymphs Abs: 2 K/uL (ref 0.7–4.0)
MCH: 29.9 pg (ref 26.0–34.0)
MCHC: 32.4 g/dL (ref 30.0–36.0)
MCV: 92.3 fL (ref 80.0–100.0)
Metamyelocytes Relative: 9 %
Monocytes Absolute: 11.2 K/uL — ABNORMAL HIGH (ref 0.1–1.0)
Monocytes Relative: 11 %
Myelocytes: 10 %
Neutro Abs: 61.9 K/uL — ABNORMAL HIGH (ref 1.7–7.7)
Neutrophils Relative %: 61 %
Platelets: 102 K/uL — ABNORMAL LOW (ref 150–400)
Promyelocytes Relative: 5 %
RBC: 2.71 MIL/uL — ABNORMAL LOW (ref 3.87–5.11)
RDW: 23.9 % — ABNORMAL HIGH (ref 11.5–15.5)
WBC: 101.5 K/uL (ref 4.0–10.5)
nRBC: 2.4 % — ABNORMAL HIGH (ref 0.0–0.2)

## 2022-01-26 LAB — SAMPLE TO BLOOD BANK

## 2022-01-26 MED ORDER — HEPARIN SOD (PORK) LOCK FLUSH 100 UNIT/ML IV SOLN
500.0000 [IU] | Freq: Once | INTRAVENOUS | Status: AC
  Administered 2022-01-26: 500 [IU] via INTRAVENOUS

## 2022-01-26 MED ORDER — SODIUM CHLORIDE 0.9% FLUSH
10.0000 mL | Freq: Once | INTRAVENOUS | Status: AC
  Administered 2022-01-26: 10 mL via INTRAVENOUS

## 2022-01-26 NOTE — Progress Notes (Signed)
Renee Waters, Renee Waters   CLINIC:  Medical Oncology/Hematology  PCP:  Renee Helper, MD [address], [place] / [place] [address] [phone]   REASON FOR VISIT:  Follow-up for MPL positive myeloproliferative neoplasm  PRIOR THERAPY: none  NGS Results: not done  CURRENT THERAPY: Jakafi 10 mg twice daily  INTERVAL HISTORY:  Renee Waters, a 76 y.o. female, seen for follow-up of MPL positive myeloproliferative neoplasm.  She reports energy levels of 75%.  Chronic hip pains are stable.  She has received shipment of momelotinib yesterday and has not started taking it yet.  She reports that energy levels have improved when we were tapering her ruxolitinib.  Last dose was taken on 01/23/2022.  Generalized itching is stable.  REVIEW OF SYSTEMS:  Review of Systems  Respiratory:  Positive for cough.   Musculoskeletal:  Positive for arthralgias (Hip, back and legs stable).  Skin:  Positive for itching.  Psychiatric/Behavioral:  The patient is nervous/anxious.   All other systems reviewed and are negative.   PAST MEDICAL/SURGICAL HISTORY:  Past Medical History:  Diagnosis Date   Acute cholangitis    Allergy    Anemia    Anxiety    Arthritis    Phreesia 03/18/2020   Barrett's esophagus    Cataract    Chronic back pain    Chronic neck pain    CKD (chronic kidney disease) stage 3, GFR 30-59 ml/min ([place]) 10/29/2020   Depression    Genital herpes    GERD (gastroesophageal reflux disease)    H/O degenerative disc disease    History of blood transfusion    Hypertension    Insomnia    Lupus (systemic lupus erythematosus) ([place])    Neuromuscular disorder ([place])    Osteoarthritis    S/P colonoscopy June 2005   normal, no polyps   S/P endoscopy June 2005, Oct 2009   2005: short-segment Barrett's, 2009: short-segment Barrett's   Upper respiratory tract infection 07/09/2020   UTI (lower urinary tract infection) 11/2012    Past Surgical History:  Procedure Laterality Date   ABDOMINAL HYSTERECTOMY     BACK SURGERY     BIOPSY N/A 03/20/2014   Procedure: BIOPSY;  Surgeon: Daneil Dolin, MD;  Location: [facility];  Service: Endoscopy;  Laterality: N/A;   BIOPSY  09/14/2015   Procedure: BIOPSY;  Surgeon: Daneil Dolin, MD;  Location: [facility];  Service: Endoscopy;;  esophageal and gastric   BIOPSY  07/23/2019   Procedure: BIOPSY;  Surgeon: Daneil Dolin, MD;  Location: [facility];  Service: Endoscopy;;  esophageal    CARPAL TUNNEL RELEASE Left 2013   cervical disectomy  2002   CESAREAN SECTION N/A    Phreesia 03/18/2020   CHOLECYSTECTOMY     with lysis of adhesions for sbo; "ruptured gallbladder".   COLONOSCOPY  11/09/2011   RMR: Melanosis coli   COLONOSCOPY WITH PROPOFOL N/A 09/14/2015   Dr. Gala Romney: diverticulosis, 70m TA removed. next TCS 09/2020.    COLONOSCOPY WITH PROPOFOL N/A 04/30/2020   Procedure: COLONOSCOPY WITH PROPOFOL;  Surgeon: RDaneil Dolin MD;  Location: [facility];  Service: Endoscopy;  Laterality: N/A;  PM (ASA 3)   DENTAL SURGERY  11/2015   multiple tooth extraction   ESOPHAGOGASTRODUODENOSCOPY  11/29/2007   salmon-colored  tongue   longest stable at  3 cm, distal esophagus as described previously status post biopsy/ Hiatal hernia, otherwise normal stomach D1 and  D2   ESOPHAGOGASTRODUODENOSCOPY  01/06/11   short segment Barrett's esophagus s/p bx/Hiatal hernia   ESOPHAGOGASTRODUODENOSCOPY (EGD) WITH PROPOFOL N/A 03/20/2014   GQQ:PYPPJKDT distal esophagus short segment barrett's, bx with no dysplasia. next egd in 03/2017   ESOPHAGOGASTRODUODENOSCOPY (EGD) WITH PROPOFOL N/A 09/14/2015   Dr. Gala Romney: Barrett's without dysplasia, gastritis benign bx, hiatal hernia. next EGD 09/2018.   ESOPHAGOGASTRODUODENOSCOPY (EGD) WITH PROPOFOL N/A 07/23/2019   Procedure: ESOPHAGOGASTRODUODENOSCOPY (EGD) WITH PROPOFOL;  Surgeon: Daneil Dolin, MD;  [facility];  Service: Endoscopy;   Laterality: N/A;  3:00pm   EYE SURGERY N/A    Phreesia 03/18/2020   HERNIA REPAIR Right 07/2010   Dr. Zada Girt   IR IMAGING GUIDED PORT INSERTION  02/09/2021   JOINT REPLACEMENT     LAPAROSCOPIC CHOLECYSTECTOMY  2017   at [facility]   POLYPECTOMY  09/14/2015   Procedure: POLYPECTOMY;  Surgeon: Daneil Dolin, MD;  Location: [facility];  Service: Endoscopy;;  ascending colon   right hip replacement  07/2010   went back in sept 2012 to fix   SHOULDER ARTHROSCOPY  2008   left   SPINE SURGERY N/A    Phreesia 03/18/2020   TOTAL HIP REVISION Right 12/17/2012   Procedure: RIGHT TOTAL HIP REVISION;  Surgeon: Mauri Pole, MD;  Location: [facility];  Service: Orthopedics;  Laterality: Right;   WRIST SURGERY Right 2011   open reduction right wrist.    SOCIAL HISTORY:  Social History   Socioeconomic History   Marital status: Married    Spouse name: louis   Number of children: 4   Years of education: 12+   Highest education level: Some college, no degree  Occupational History   Occupation: Press photographer  - retired   Occupation: non profit  Tobacco Use   Smoking status: Former    Packs/day: 0.25    Years: 25.00    Total pack years: 6.25    Types: Cigarettes    Quit date: 02/07/2003    Years since quitting: 18.9   Smokeless tobacco: Never   Tobacco comments:    quit in 2004  Vaping Use   Vaping Use: Never used  Substance and Sexual Activity   Alcohol use: No   Drug use: No   Sexual activity: Not Currently    Birth control/protection: Surgical  Other Topics Concern   Not on file  Social History Narrative   Not on file   Social Determinants of Health   Financial Resource Strain: Low Risk  (12/28/2020)   Overall Financial Resource Strain (CARDIA)    Difficulty of Paying Living Expenses: Not hard at all  Food Insecurity: No Food Insecurity (11/11/2021)   Hunger Vital Sign    Worried About Running Out of Food in the Last Year: Never true    [place] in the Last Year: Never  true  Transportation Needs: No Transportation Needs (11/11/2021)   PRAPARE - Hydrologist (Medical): No    Lack of Transportation (Non-Medical): No  Physical Activity: Inactive (04/23/2021)   Exercise Vital Sign    Days of Exercise per Week: 0 days    Minutes of Exercise per Session: 0 min  Stress: Stress Concern Present (04/23/2021)   [place]    Feeling of Stress : Rather much  Social Connections: Moderately Integrated (12/28/2020)   Social Connection and Isolation Panel [NHANES]    Frequency of Communication with Friends and Family: More  than three times a week    Frequency of Social Gatherings with Friends and Family: Once a week    Attends Religious Services: More than 4 times per year    Active Member of [employer] or Organizations: No    Attends Archivist Meetings: Never    Marital Status: Married  Human resources officer Violence: Not At Risk (11/11/2021)   Humiliation, Afraid, Rape, and Kick questionnaire    Fear of Current or Ex-Partner: No    Emotionally Abused: No    Physically Abused: No    Sexually Abused: No    FAMILY HISTORY:  Family History  Problem Relation Age of Onset   Hypertension Mother    Stroke Mother    Colon cancer Neg Hx    Anesthesia problems Neg Hx    Hypotension Neg Hx    Malignant hyperthermia Neg Hx    Pseudochol deficiency Neg Hx    Gastric cancer Neg Hx    Esophageal cancer Neg Hx     CURRENT MEDICATIONS:  Current Outpatient Medications  Medication Sig Dispense Refill   oxyCODONE (ROXICODONE) 15 MG immediate release tablet Take 15 mg by mouth every 6 (six) hours as needed.     acyclovir (ZOVIRAX) 800 MG tablet Take 1 tablet (800 mg total) by mouth 5 (five) times daily. 50 tablet 1   acyclovir ointment (ZOVIRAX) 5 % Apply 1 application. topically every 3 (three) hours. 15 g 1   albuterol (VENTOLIN HFA) 108 (90 Base) MCG/ACT inhaler Inhale 2 puffs  into the lungs every 4 (four) hours as needed for wheezing or shortness of breath. 8 g 11   allopurinol (ZYLOPRIM) 300 MG tablet Take 1 tablet (300 mg total) by mouth daily. 30 tablet 3   busPIRone (BUSPAR) 5 MG tablet Take one tablet by mouth once daily, as needed, for anxiety 30 tablet 2   cetirizine (ZYRTEC) 10 MG tablet Take 10 mg by mouth daily.     citalopram (CELEXA) 20 MG tablet Take 1 tablet by mouth once daily 30 tablet 0   EPINEPHrine 0.3 mg/0.3 mL IJ SOAJ injection INJECT 0.3 MLS INTO MUSCLE ONCE AS NEEDED 1 each 2   furosemide (LASIX) 20 MG tablet TAKE 1 TABLET BY MOUTH ONCE DAILY AS NEEDED 30 tablet 3   hydrOXYzine (ATARAX) 25 MG tablet Take 12.5-25 mg by mouth 2 (two) times daily as needed.     lidocaine-prilocaine (EMLA) cream Apply 1 Application topically as needed (Access to port and pain). 30 g 6   metoprolol tartrate (LOPRESSOR) 25 MG tablet Take one and a half tablets twice daily 180 tablet 3   mirtazapine (REMERON) 15 MG tablet TAKE 1 TABLET BY MOUTH AT BEDTIME 30 tablet 0   Momelotinib Dihydrochloride (OJJAARA) 100 MG TABS Take 100 mg by mouth daily.     Multiple Vitamin (MULTIVITAMIN WITH MINERALS) TABS tablet Take 1 tablet by mouth every other day.     pantoprazole (PROTONIX) 40 MG tablet Take 1 tablet (40 mg total) by mouth 2 (two) times daily before a meal. 60 tablet 0   potassium chloride (KLOR-CON) 10 MEQ tablet Take 1 tablet by mouth once daily 30 tablet 4   prochlorperazine (COMPAZINE) 10 MG tablet Take 1 tablet (10 mg total) by mouth every 6 (six) hours as needed for nausea or vomiting. 60 tablet 1   temazepam (RESTORIL) 30 MG capsule Take 1 capsule (30 mg total) by mouth at bedtime as needed for sleep. 30 capsule 5   tiZANidine (ZANAFLEX) 4  MG tablet Take by mouth.     No current facility-administered medications for this visit.   Facility-Administered Medications Ordered in Other Visits  Medication Dose Route Frequency Provider Last Rate Last Admin    lanreotide acetate (SOMATULINE DEPOT) 120 MG/0.5ML injection            lanreotide acetate (SOMATULINE DEPOT) 120 MG/0.5ML injection            lanreotide acetate (SOMATULINE DEPOT) 120 MG/0.5ML injection            octreotide (SANDOSTATIN LAR) 30 MG IM injection            octreotide (SANDOSTATIN LAR) 30 MG IM injection             ALLERGIES:  Allergies  Allergen Reactions   Bee Venom Swelling and Hives   Norvasc [Amlodipine] Other (See Comments)    Hair loss   Tylenol [Acetaminophen] Itching   Hydralazine Other (See Comments)    Hair loss   Red Dye Itching   Percocet [Oxycodone-Acetaminophen] Rash and Other (See Comments)    Pt states, "this gives her a rash, but at home she takes oxycodone for pain relief"    Tyloxapol Itching and Rash    PHYSICAL EXAM:  Performance status (ECOG): 1 - Symptomatic but completely ambulatory  There were no vitals filed for this visit.  Wt Readings from Last 3 Encounters:  01/26/22 131 lb 6.4 oz (59.6 kg)  01/17/22 129 lb 6.4 oz (58.7 kg)  01/07/22 131 lb (59.4 kg)   Physical Exam Vitals reviewed.  Constitutional:      Appearance: Normal appearance.  Cardiovascular:     Rate and Rhythm: Normal rate and regular rhythm.     Pulses: Normal pulses.     Heart sounds: Normal heart sounds.  Pulmonary:     Effort: Pulmonary effort is normal.     Breath sounds: Normal breath sounds.  Abdominal:     Palpations: Abdomen is soft. There is hepatomegaly (palpable 3 fingerbreadths below costal margin) and splenomegaly (2 fingerbreadths below costal margin'). There is no mass.     Tenderness: There is no abdominal tenderness.  Musculoskeletal:     Right lower leg: 1+ Edema present.     Left lower leg: 1+ Edema present.  Neurological:     General: No focal deficit present.     Mental Status: She is alert and oriented to person, place, and time.  Psychiatric:        Mood and Affect: Mood normal.        Behavior: Behavior normal.       LABORATORY DATA:  I have reviewed the labs as listed.     Latest Ref Rng & Units 01/26/2022    2:01 PM 01/18/2022   11:37 AM 01/11/2022    2:48 PM  CBC  WBC 4.0 - 10.5 K/uL 101.5  128.2  115.2   Hemoglobin 12.0 - 15.0 g/dL 8.1  9.1  7.8   Hematocrit 36.0 - 46.0 % 25.0  27.7  23.9   Platelets 150 - 400 K/uL 102  193  140       Latest Ref Rng & Units 01/18/2022   11:37 AM 01/04/2022    1:34 PM 12/13/2021   11:56 AM  CMP  Glucose 70 - 99 mg/dL 120  101  123   BUN 8 - 23 mg/dL 20  24  41   Creatinine 0.44 - 1.00 mg/dL 1.30  1.23  2.25   Sodium 135 -  145 mmol/L 139  140  138   Potassium 3.5 - 5.1 mmol/L 4.1  4.2  4.0   Chloride 98 - 111 mmol/L 111  110  109   CO2 22 - 32 mmol/L _0 Calcium 8.9 - 10.3 mg/dL 8.8  8.4  8.0   Total Protein 6.5 - 8.1 g/dL 7.6  7.1  7.6   Total Bilirubin 0.3 - 1.2 mg/dL 0.6  0.6  0.6   Alkaline Phos 38 - 126 U/L 281  220  166   AST 15 - 41 U/L 50  55  36   ALT 0 - 44 U/L 32  36  19     DIAGNOSTIC IMAGING:  I have independently reviewed the scans and discussed with the patient. No results found.   ASSESSMENT:  MPL positive prefibrotic/early primary myelofibrosis: - CBC on 11/06/2020 with white count 75.6, differential 59% neutrophils, 12% monocytes, 4% lymphocytes, 1 percentage of eosinophils and basophils, 6% band neutrophils, 12% myelocytes, 1% promyelocytes, 29% blasts. - Pathologist review of blood smear reported as leukoerythroblastic reaction. - 25 pound weight loss in the last 6 months, unintentional.  Decreased appetite.  Reports fatigue for the last few months.  Reports night sweats x3 in the last 1 month. - Bone marrow biopsy on 11/18/2020 consistent with granulocytic proliferation with differential including CMML versus myeloproliferative disorder. - BCR/ABL was negative. 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 - NGS myeloid panel shows mutations in ASXL1, MPL, TET2, EZH2 mutations. - Spleen ultrasound on  12/16/2020 shows mildly enlarged measuring 12.4 x 9.6 x 7 cm with volume of 435 cc. - PDGFR alpha, beta and FGFR 1 was negative. - She was evaluated by Dr. Florene Glen at [facility].  Slides were reviewed at [facility] hematopathology.  They thought it was less likely CMML and more likely MPL positive myeloproliferative neoplasm.  Dr. Florene Glen has recommended initiate Jakafi. - Given anemia, B symptoms, cytogenetics with MPL, ASLX1, normal karyotype she is intermediate risk on GIPPS at high risk MIPSS70+ - Ruxolitinib 10 mg twice daily started on 02/16/2021.  Dose increased to 15 mg twice daily on 05/18/2021. - CTAP on 04/07/2021: Hepatomegaly and splenomegaly measuring 14.3 cm.  No other pathology.  No spleen infarcts. -Ruxolitinib dose increased to 15 mg twice daily on 09/20/2021. - Ultrasound spleen (09/21/2021): 13.6 cm with volume 532 mL.  This is slightly improved from previous volume. - Bone marrow biopsy (09/09/2021): Material is limited but overall features consistent with myeloid neoplasm.  No increase in blasts.  Erythroid precursors appear decreased but megakaryocytes are variably evident with clusters and small forms.  Reticulin's shows variable increase in reticulin fibers including areas with prominent increase.  Chromosome analysis was normal. - Tapering doses of ruxolitinib started on 12/24/2021, last dose on 01/23/2022 - Evaluated by Dr. Florene Glen on 12/24/2021. - Momelotinib 100 mg daily started on 01/26/2022  2.  Social/family history: - She lives at home and is able to do all her ADLs and IADLs although she is getting tired lately.  She reports quitting smoking 6 months ago and smoked 1 pack/week for 43 years. - She believes that her mother had some kind of leukemia.  No other malignancies.   PLAN:  MPL positive myeloproliferative neoplasm: - She reported improvement in energy levels when we started tapering of ruxolitinib.  She took her last dose on 01/23/2022. - She received  momelotinib shipment yesterday and has not started taking it yet.  We discussed side effects and answered all her questions. - Generalized itching is stable. - Reviewed labs today which showed white count 101, hemoglobin 8.1 and platelet count 102. - We will start momelotinib 100 mg daily and titrate up to full dose 200 mg as tolerated. - She will start taking it today.  RTC 2 weeks for follow-up with repeat labs.  2.  Decreased appetite: - Her weight is stable since last visit.  3.  CKD: - Her last creatinine is better at 1.3.  Baseline creatinine between 1.3-1.5. - Will closely monitor while she starts momelotinib.  4.  Hypokalemia: - Continue potassium supplements.  Potassium is normal.  5.  Generalized itching: - Generalized itching has been stable even though she took last dose of ruxolitinib on 01/23/2022.    Orders placed this encounter:  Orders Placed This Encounter  Procedures   CBC with Differential/Platelet   Comprehensive metabolic panel   Magnesium   Lactate dehydrogenase       Derek Jack, MD [place] [phone]

## 2022-01-26 NOTE — Patient Instructions (Signed)
[facility]  Discharge Instructions: Thank you for choosing [facility] to provide your oncology and hematology care.  If you have a lab appointment with the Cancer Center, please come in thru the Main Entrance and check in at the main information desk.  Wear comfortable clothing and clothing appropriate for easy access to any Portacath or PICC line.   We strive to give you quality time with your provider. You may need to reschedule your appointment if you arrive late (15 or more minutes).  Arriving late affects you and other patients whose appointments are after yours.  Also, if you miss three or more appointments without notifying the office, you may be dismissed from the clinic at the provider's discretion.      For prescription refill requests, have your pharmacy contact our office and allow 72 hours for refills to be completed.    Today you received the following chemotherapy and/or immunotherapy agents port flush.      To help prevent nausea and vomiting after your treatment, we encourage you to take your nausea medication as directed.  BELOW ARE SYMPTOMS THAT SHOULD BE REPORTED IMMEDIATELY: *FEVER GREATER THAN 100.4 F (38 C) OR HIGHER *CHILLS OR SWEATING *NAUSEA AND VOMITING THAT IS NOT CONTROLLED WITH YOUR NAUSEA MEDICATION *UNUSUAL SHORTNESS OF BREATH *UNUSUAL BRUISING OR BLEEDING *URINARY PROBLEMS (pain or burning when urinating, or frequent urination) *BOWEL PROBLEMS (unusual diarrhea, constipation, pain near the anus) TENDERNESS IN MOUTH AND THROAT WITH OR WITHOUT PRESENCE OF ULCERS (sore throat, sores in mouth, or a toothache) UNUSUAL RASH, SWELLING OR PAIN  UNUSUAL VAGINAL DISCHARGE OR ITCHING   Items with * indicate a potential emergency and should be followed up as soon as possible or go to the Emergency Department if any problems should occur.  Please show the CHEMOTHERAPY ALERT CARD or IMMUNOTHERAPY ALERT CARD at check-in to the  Emergency Department and triage nurse.  Should you have questions after your visit or need to cancel or reschedule your appointment, please contact [facility] [phone]  and follow the prompts.  Office hours are 8:00 a.m. to 4:30 p.m. Monday - Friday. Please note that voicemails left after 4:00 p.m. may not be returned until the following business day.  We are closed weekends and major holidays. You have access to a nurse at all times for urgent questions. Please call the main number to the clinic [phone] and follow the prompts.  For any non-urgent questions, you may also contact your provider using MyChart. We now offer e-Visits for anyone 18 and older to request care online for non-urgent symptoms. For details visit [email or web address].   Also download the MyChart app! Go to the app store, search "MyChart", open the app, select [facility], and log in with your MyChart username and password.  Masks are optional in the cancer centers. If you would like for your care team to wear a mask while they are taking care of you, please let them know. You may have one support person who is at least 76 years old accompany you for your appointments.  

## 2022-01-26 NOTE — Progress Notes (Signed)
Patients port flushed without difficulty.  Good blood return noted with no bruising or swelling noted at site.  Band aid applied.  VSS with discharge and left in satisfactory condition with no s/s of distress noted.   

## 2022-01-26 NOTE — Patient Instructions (Addendum)
[facility]  Discharge Instructions  You were seen and examined today by [name].  [name] discussed your most recent lab work which revealed that your labs are good and stable.  Start taking the Momelotbinib.  Follow-up as scheduled in 2 weeks.    Thank you for choosing [facility] to provide your oncology and hematology care.   To afford each patient quality time with our provider, please arrive at least 15 minutes before your scheduled appointment time. You may need to reschedule your appointment if you arrive late (10 or more minutes). Arriving late affects you and other patients whose appointments are after yours.  Also, if you miss three or more appointments without notifying the office, you may be dismissed from the clinic at the provider's discretion.    Again, thank you for choosing [facility].  Our hope is that these requests will decrease the amount of time that you wait before being seen by our physicians.   If you have a lab appointment with the [facility] please come in thru the Main Entrance and check in at the main information desk.           _____________________________________________________________  Should you have questions after your visit to [facility], please contact our office at [phone] and follow the prompts.  Our office hours are 8:00 a.m. to 4:30 p.m. Monday - Thursday and 8:00 a.m. to 2:30 p.m. Friday.  Please note that voicemails left after 4:00 p.m. may not be returned until the following business day.  We are closed weekends and all major holidays.  You do have access to a nurse 24-7, just call the main number to the clinic [phone] and do not press any options, hold on the line and a nurse will answer the phone.    For prescription refill requests, have your pharmacy contact our office and allow 72 hours.    Masks are optional in the cancer  centers. If you would like for your care team to wear a mask while they are taking care of you, please let them know. You may have one support person who is at least 76 years old accompany you for your appointments.

## 2022-01-27 ENCOUNTER — Inpatient Hospital Stay: Payer: Medicare Other

## 2022-02-01 ENCOUNTER — Other Ambulatory Visit: Payer: Self-pay | Admitting: Family Medicine

## 2022-02-01 DIAGNOSIS — D47Z9 Other specified neoplasms of uncertain behavior of lymphoid, hematopoietic and related tissue: Secondary | ICD-10-CM | POA: Diagnosis not present

## 2022-02-01 DIAGNOSIS — Z515 Encounter for palliative care: Secondary | ICD-10-CM | POA: Diagnosis not present

## 2022-02-08 ENCOUNTER — Other Ambulatory Visit: Payer: Self-pay

## 2022-02-08 ENCOUNTER — Emergency Department (HOSPITAL_COMMUNITY): Payer: Medicare Other

## 2022-02-08 ENCOUNTER — Inpatient Hospital Stay (HOSPITAL_COMMUNITY)
Admission: EM | Admit: 2022-02-08 | Discharge: 2022-02-10 | DRG: 683 | Disposition: A | Discharge status: Home Health | Payer: Medicare Other | Attending: Family Medicine | Admitting: Family Medicine

## 2022-02-08 ENCOUNTER — Encounter (HOSPITAL_COMMUNITY): Payer: Self-pay | Admitting: *Deleted

## 2022-02-08 DIAGNOSIS — N179 Acute kidney failure, unspecified: Secondary | ICD-10-CM | POA: Diagnosis not present

## 2022-02-08 DIAGNOSIS — R001 Bradycardia, unspecified: Secondary | ICD-10-CM | POA: Diagnosis present

## 2022-02-08 DIAGNOSIS — Z9049 Acquired absence of other specified parts of digestive tract: Secondary | ICD-10-CM | POA: Diagnosis not present

## 2022-02-08 DIAGNOSIS — N189 Chronic kidney disease, unspecified: Secondary | ICD-10-CM | POA: Diagnosis present

## 2022-02-08 DIAGNOSIS — D631 Anemia in chronic kidney disease: Secondary | ICD-10-CM | POA: Diagnosis present

## 2022-02-08 DIAGNOSIS — I10 Essential (primary) hypertension: Secondary | ICD-10-CM | POA: Insufficient documentation

## 2022-02-08 DIAGNOSIS — Z87891 Personal history of nicotine dependence: Secondary | ICD-10-CM

## 2022-02-08 DIAGNOSIS — N3000 Acute cystitis without hematuria: Secondary | ICD-10-CM | POA: Diagnosis not present

## 2022-02-08 DIAGNOSIS — J9601 Acute respiratory failure with hypoxia: Secondary | ICD-10-CM

## 2022-02-08 DIAGNOSIS — D696 Thrombocytopenia, unspecified: Secondary | ICD-10-CM | POA: Diagnosis present

## 2022-02-08 DIAGNOSIS — E860 Dehydration: Secondary | ICD-10-CM | POA: Diagnosis present

## 2022-02-08 DIAGNOSIS — E875 Hyperkalemia: Secondary | ICD-10-CM | POA: Diagnosis not present

## 2022-02-08 DIAGNOSIS — R7401 Elevation of levels of liver transaminase levels: Secondary | ICD-10-CM | POA: Diagnosis not present

## 2022-02-08 DIAGNOSIS — Z888 Allergy status to other drugs, medicaments and biological substances status: Secondary | ICD-10-CM

## 2022-02-08 DIAGNOSIS — M329 Systemic lupus erythematosus, unspecified: Secondary | ICD-10-CM | POA: Diagnosis not present

## 2022-02-08 DIAGNOSIS — Z1152 Encounter for screening for COVID-19: Secondary | ICD-10-CM

## 2022-02-08 DIAGNOSIS — I959 Hypotension, unspecified: Secondary | ICD-10-CM | POA: Diagnosis not present

## 2022-02-08 DIAGNOSIS — K219 Gastro-esophageal reflux disease without esophagitis: Secondary | ICD-10-CM | POA: Diagnosis not present

## 2022-02-08 DIAGNOSIS — Z8249 Family history of ischemic heart disease and other diseases of the circulatory system: Secondary | ICD-10-CM | POA: Diagnosis not present

## 2022-02-08 DIAGNOSIS — D471 Chronic myeloproliferative disease: Secondary | ICD-10-CM | POA: Diagnosis present

## 2022-02-08 DIAGNOSIS — Z96641 Presence of right artificial hip joint: Secondary | ICD-10-CM | POA: Diagnosis present

## 2022-02-08 DIAGNOSIS — D649 Anemia, unspecified: Secondary | ICD-10-CM | POA: Diagnosis not present

## 2022-02-08 DIAGNOSIS — I120 Hypertensive chronic kidney disease with stage 5 chronic kidney disease or end stage renal disease: Secondary | ICD-10-CM | POA: Diagnosis present

## 2022-02-08 DIAGNOSIS — N185 Chronic kidney disease, stage 5: Secondary | ICD-10-CM | POA: Diagnosis not present

## 2022-02-08 DIAGNOSIS — Z823 Family history of stroke: Secondary | ICD-10-CM

## 2022-02-08 DIAGNOSIS — N3001 Acute cystitis with hematuria: Secondary | ICD-10-CM | POA: Diagnosis not present

## 2022-02-08 DIAGNOSIS — N390 Urinary tract infection, site not specified: Secondary | ICD-10-CM | POA: Diagnosis present

## 2022-02-08 DIAGNOSIS — Z79899 Other long term (current) drug therapy: Secondary | ICD-10-CM | POA: Diagnosis not present

## 2022-02-08 DIAGNOSIS — C946 Myelodysplastic disease, not classified: Secondary | ICD-10-CM | POA: Diagnosis present

## 2022-02-08 DIAGNOSIS — R6510 Systemic inflammatory response syndrome (SIRS) of non-infectious origin without acute organ dysfunction: Secondary | ICD-10-CM

## 2022-02-08 DIAGNOSIS — Z0389 Encounter for observation for other suspected diseases and conditions ruled out: Secondary | ICD-10-CM | POA: Diagnosis not present

## 2022-02-08 LAB — URINALYSIS, ROUTINE W REFLEX MICROSCOPIC
Bacteria, UA: NONE SEEN
Bilirubin Urine: NEGATIVE
Glucose, UA: NEGATIVE mg/dL
Ketones, ur: NEGATIVE mg/dL
Nitrite: POSITIVE — AB
Protein, ur: >=300 mg/dL — AB
Specific Gravity, Urine: 1.009 (ref 1.005–1.030)
Trans Epithel, UA: 2
WBC, UA: >50 WBC/hpf — ABNORMAL HIGH (ref 0–5)
pH: 6 (ref 5.0–8.0)

## 2022-02-08 LAB — CBC WITH DIFFERENTIAL/PLATELET
Abs Immature Granulocytes: 19.75 10*3/uL — ABNORMAL HIGH (ref 0.00–0.07)
Band Neutrophils: 8 %
Basophils Absolute: 0 10*3/uL (ref 0.0–0.1)
Basophils Relative: 0 %
Blasts: 0 %
Eosinophils Absolute: 2.2 10*3/uL — ABNORMAL HIGH (ref 0.0–0.5)
Eosinophils Relative: 2 %
HCT: 23.6 % — ABNORMAL LOW (ref 36.0–46.0)
Hemoglobin: 7.3 g/dL — ABNORMAL LOW (ref 12.0–15.0)
Lymphocytes Relative: 2 %
Lymphs Abs: 2.2 10*3/uL (ref 0.7–4.0)
MCH: 29.7 pg (ref 26.0–34.0)
MCHC: 30.9 g/dL (ref 30.0–36.0)
MCV: 95.9 fL (ref 80.0–100.0)
Metamyelocytes Relative: 6 %
Monocytes Absolute: 9.9 10*3/uL — ABNORMAL HIGH (ref 0.1–1.0)
Monocytes Relative: 9 %
Myelocytes: 0 %
Neutro Abs: 75.7 10*3/uL — ABNORMAL HIGH (ref 1.7–7.7)
Neutrophils Relative %: 61 %
Other: 0 %
Platelets: 98 10*3/uL — ABNORMAL LOW (ref 150–400)
Promyelocytes Relative: 12 %
RBC: 2.46 MIL/uL — ABNORMAL LOW (ref 3.87–5.11)
RDW: 24.8 % — ABNORMAL HIGH (ref 11.5–15.5)
WBC: 109.7 10*3/uL (ref 4.0–10.5)
nRBC: 0 /100 WBC
nRBC: 3 % — ABNORMAL HIGH (ref 0.0–0.2)

## 2022-02-08 LAB — COMPREHENSIVE METABOLIC PANEL
ALT: 21 U/L (ref 0–44)
AST: 46 U/L — ABNORMAL HIGH (ref 15–41)
Albumin: 3.5 g/dL (ref 3.5–5.0)
Alkaline Phosphatase: 154 U/L — ABNORMAL HIGH (ref 38–126)
Anion gap: 5 (ref 5–15)
BUN: 53 mg/dL — ABNORMAL HIGH (ref 8–23)
CO2: 22 mmol/L (ref 22–32)
Calcium: 8.1 mg/dL — ABNORMAL LOW (ref 8.9–10.3)
Chloride: 108 mmol/L (ref 98–111)
Creatinine, Ser: 3.87 mg/dL — ABNORMAL HIGH (ref 0.44–1.00)
GFR, Estimated: 12 mL/min — ABNORMAL LOW (ref >60)
Glucose, Bld: 97 mg/dL (ref 70–99)
Potassium: 5.8 mmol/L — ABNORMAL HIGH (ref 3.5–5.1)
Sodium: 135 mmol/L (ref 135–145)
Total Bilirubin: 1 mg/dL (ref 0.3–1.2)
Total Protein: 7.4 g/dL (ref 6.5–8.1)

## 2022-02-08 LAB — LACTIC ACID, PLASMA: Lactic Acid, Venous: 0.6 mmol/L (ref 0.5–1.9)

## 2022-02-08 LAB — PROTIME-INR
INR: 1.7 — ABNORMAL HIGH (ref 0.8–1.2)
Prothrombin Time: 19.6 seconds — ABNORMAL HIGH (ref 11.4–15.2)

## 2022-02-08 LAB — RESP PANEL BY RT-PCR (RSV, FLU A&B, COVID)  RVPGX2
Influenza A by PCR: NEGATIVE
Influenza B by PCR: NEGATIVE
Resp Syncytial Virus by PCR: NEGATIVE
SARS Coronavirus 2 by RT PCR: NEGATIVE

## 2022-02-08 MED ORDER — SODIUM ZIRCONIUM CYCLOSILICATE 5 G PO PACK
10.0000 g | PACK | Freq: Once | ORAL | Status: AC
  Administered 2022-02-08: 10 g via ORAL
  Filled 2022-02-08: qty 2

## 2022-02-08 MED ORDER — OXYCODONE HCL 5 MG PO TABS
15.0000 mg | ORAL_TABLET | Freq: Once | ORAL | Status: AC
  Administered 2022-02-08: 15 mg via ORAL
  Filled 2022-02-08: qty 3

## 2022-02-08 MED ORDER — SODIUM CHLORIDE 0.9 % IV SOLN
1.0000 g | Freq: Once | INTRAVENOUS | Status: AC
  Administered 2022-02-08: 1 g via INTRAVENOUS
  Filled 2022-02-08: qty 10

## 2022-02-08 MED ORDER — LACTATED RINGERS IV BOLUS
1800.0000 mL | Freq: Once | INTRAVENOUS | Status: AC
  Administered 2022-02-08: 1800 mL via INTRAVENOUS

## 2022-02-08 MED ORDER — FENTANYL CITRATE PF 50 MCG/ML IJ SOSY: 50.0000 ug | PREFILLED_SYRINGE | Freq: Once | INTRAMUSCULAR | Status: DC

## 2022-02-08 NOTE — ED Triage Notes (Signed)
Pt is here for generalized weakness and body aches and feeling like she has a UTI, no fever

## 2022-02-08 NOTE — H&P (Incomplete)
History and Physical    Patient: Renee Waters QZE:[id number] DOB: Feb 15, 1945 DOA: 02/08/2022 DOS: the patient was seen and examined on 02/08/2022 PCP: [name], MD  Patient coming from: Home  Chief Complaint:  Chief Complaint  Patient presents with  . Illness  . Hypotension   HPI: Renee Waters is a 77 y.o. female with medical history significant of  myeloproliferative neoplasm (follows with [name]), hypertension, GERD, chronic back pain, CKD stage III who presents to the emergency department due to generalized weakness, body aches, increased urinary frequency with burning sensation with urination.  She complained of several days of not drinking enough fluid and having poor appetite.  Patient states that she has some leftover antibiotics taken about a week ago due to concern about possible UTI which was not helpful (she does not remember the name of the antibiotics).  Patient denies chest pain, shortness of breath, nausea, vomiting or diarrhea.  ED Course:  In the emergency department, she was intermittently bradycardic and was hypotensive on arrival with BP of 62/45, other vital signs were within normal range.  Workup in the ED showed WBC of 100.7, hemoglobin 7.3, hematocrit 33.6, platelets 98.  BMP was normal except for potassium of 5.8 and BUN/creatinine of 53/3.87 (baseline creatinine ranged within 1.3-1.6), Influenza A, B, SARS coronavirus 2, RSV was negative.   Chest x-ray showed no acute cardiopulmonary process Patient was treated with IV ceftriaxone, oxycodone 50 mg x 1 was given and Lokelma was given due to hyperkalemia.  Hospitalist was asked to admit patient for further evaluation and management.  Review of Systems: Review of systems as noted in the HPI. All other systems reviewed and are negative.   Past Medical History:  Diagnosis Date  . Acute cholangitis   . Allergy   . Anemia   . Anxiety   . Arthritis    Phreesia 03/18/2020  . Barrett's esophagus    . Cataract   . Chronic back pain   . Chronic neck pain   . CKD (chronic kidney disease) stage 3, GFR 30-59 ml/min (HCC) 10/29/2020  . Depression   . Genital herpes   . GERD (gastroesophageal reflux disease)   . H/O degenerative disc disease   . History of blood transfusion   . Hypertension   . Insomnia   . Lupus (systemic lupus erythematosus) ([place])   . Neuromuscular disorder ([place])   . Osteoarthritis   . S/P colonoscopy June 2005   normal, no polyps  . S/P endoscopy June 2005, Oct 2009   2005: short-segment Barrett's, 2009: short-segment Barrett's  . Upper respiratory tract infection 07/09/2020  . UTI (lower urinary tract infection) 11/2012   Past Surgical History:  Procedure Laterality Date  . ABDOMINAL HYSTERECTOMY    . BACK SURGERY    . BIOPSY N/A 03/20/2014   Procedure: BIOPSY;  Surgeon: Daneil Dolin, MD;  Location: [facility];  Service: Endoscopy;  Laterality: N/A;  . BIOPSY  09/14/2015   Procedure: BIOPSY;  Surgeon: Daneil Dolin, MD;  [facility];  Service: Endoscopy;;  esophageal and gastric  . BIOPSY  07/23/2019   Procedure: BIOPSY;  Surgeon: Daneil Dolin, MD;  [facility];  Service: Endoscopy;;  esophageal   . CARPAL TUNNEL RELEASE Left 2013  . cervical disectomy  2002  . CESAREAN SECTION N/A    Phreesia 03/18/2020  . CHOLECYSTECTOMY     with lysis of adhesions for sbo; "ruptured gallbladder".  . COLONOSCOPY  11/09/2011   RMR:  Melanosis coli  . COLONOSCOPY WITH PROPOFOL N/A 09/14/2015   Dr. Gala Romney: diverticulosis, 71m TA removed. next TCS 09/2020.   .Marland KitchenCOLONOSCOPY WITH PROPOFOL N/A 04/30/2020   Procedure: COLONOSCOPY WITH PROPOFOL;  Surgeon: RDaneil Dolin MD;  [facility];  Service: Endoscopy;  Laterality: N/A;  PM (ASA 3)  . DENTAL SURGERY  11/2015   multiple tooth extraction  . ESOPHAGOGASTRODUODENOSCOPY  11/29/2007   salmon-colored  tongue   longest stable at  3 cm, distal esophagus as described previously status post biopsy/  Hiatal hernia, otherwise normal stomach D1 and D2  . ESOPHAGOGASTRODUODENOSCOPY  01/06/11   short segment Barrett's esophagus s/p bx/Hiatal hernia  . ESOPHAGOGASTRODUODENOSCOPY (EGD) WITH PROPOFOL N/A 03/20/2014   REXN:TZGYFVCBdistal esophagus short segment barrett's, bx with no dysplasia. next egd in 03/2017  . ESOPHAGOGASTRODUODENOSCOPY (EGD) WITH PROPOFOL N/A 09/14/2015   Dr. RGala Romney Barrett's without dysplasia, gastritis benign bx, hiatal hernia. next EGD 09/2018.  .Marland KitchenESOPHAGOGASTRODUODENOSCOPY (EGD) WITH PROPOFOL N/A 07/23/2019   Procedure: ESOPHAGOGASTRODUODENOSCOPY (EGD) WITH PROPOFOL;  Surgeon: RDaneil Dolin MD;  [facility];  Service: Endoscopy;  Laterality: N/A;  3:00pm  . EYE SURGERY N/A    Phreesia 03/18/2020  . HERNIA REPAIR Right 07/2010   Dr. BZada Girt . IR IMAGING GUIDED PORT INSERTION  02/09/2021  . JOINT REPLACEMENT    . [facility]   [facility] . POLYPECTOMY  09/14/2015   Procedure: POLYPECTOMY;  Surgeon: RDaneil Dolin MD;  [facility];  Service: Endoscopy;;  ascending colon  . right hip replacement  07/2010   went back in sept 2012 to fix  . SHOULDER ARTHROSCOPY  2008   left  . SPINE SURGERY N/A    Phreesia 03/18/2020  . TOTAL HIP REVISION Right 12/17/2012   Procedure: RIGHT TOTAL HIP REVISION;  Surgeon: MMauri Pole MD;  Location: [facility];  Service: Orthopedics;  Laterality: Right;  . WRIST SURGERY Right 2011   open reduction right wrist.    Social History:  reports that she quit smoking about 19 years ago. Her smoking use included cigarettes. She has a 6.25 pack-year smoking history. She has never used smokeless tobacco. She reports that she does not drink alcohol and does not use drugs.   Allergies  Allergen Reactions  . Bee Venom Swelling and Hives  . Norvasc [Amlodipine] Other (See Comments)    Hair loss  . Tylenol [Acetaminophen] Itching  . Hydralazine Other (See Comments)    Hair loss  . Red Dye Itching  .  Percocet [Oxycodone-Acetaminophen] Rash and Other (See Comments)    Pt states, "this gives her a rash, but at home she takes oxycodone for pain relief"   . Tyloxapol Itching and Rash    Family History  Problem Relation Age of Onset  . Hypertension Mother   . Stroke Mother   . Colon cancer Neg Hx   . Anesthesia problems Neg Hx   . Hypotension Neg Hx   . Malignant hyperthermia Neg Hx   . Pseudochol deficiency Neg Hx   . Gastric cancer Neg Hx   . Esophageal cancer Neg Hx     ***  Prior to Admission medications   Medication Sig Start Date End Date Taking? Authorizing Provider  acyclovir (ZOVIRAX) 800 MG tablet Take 1 tablet (800 mg total) by mouth 5 (five) times daily. 04/21/21  Yes SFayrene Helper MD  acyclovir ointment (ZOVIRAX) 5 % Apply 1 application. topically every 3 (three) hours.  04/21/21  Yes [name], MD  allopurinol (ZYLOPRIM) 300 MG tablet Take 1 tablet (300 mg total) by mouth daily. 10/04/21  Yes Derek Jack, MD  busPIRone (BUSPAR) 5 MG tablet Take one tablet by mouth once daily, as needed, for anxiety Patient taking differently: Take 5 mg by mouth daily as needed (anxiety). Take one tablet by mouth once daily, as needed, for anxiety 01/11/22  Yes [name], MD  cetirizine (ZYRTEC) 10 MG tablet Take 10 mg by mouth daily. 11/26/21  Yes [provider]  citalopram (CELEXA) 20 MG tablet Take 1 tablet by mouth once daily 02/02/22  Yes [name], MD  EPINEPHrine 0.3 mg/0.3 mL IJ SOAJ injection INJECT 0.3 MLS INTO MUSCLE ONCE AS NEEDED 01/11/22  Yes Lyndal Pulley, MD  furosemide (LASIX) 20 MG tablet TAKE 1 TABLET BY MOUTH ONCE DAILY AS NEEDED 01/04/22  Yes Derek Jack, MD  hydrOXYzine (ATARAX) 25 MG tablet Take 12.5-25 mg by mouth 2 (two) times daily as needed. 01/03/22  Yes [provider]  lidocaine-prilocaine (EMLA) cream Apply 1 Application topically as needed (Access to port and pain). 11/15/21  Yes Derek Jack, MD  metoprolol tartrate (LOPRESSOR) 25 MG tablet Take one and a half tablets twice daily 01/07/22  Yes [name], MD  mirtazapine (REMERON) 15 MG tablet TAKE 1 TABLET BY MOUTH AT BEDTIME 02/02/22  Yes [name], MD  Momelotinib Dihydrochloride (OJJAARA) 100 MG TABS Take 100 mg by mouth daily.   Yes Derek Jack, MD  Multiple Vitamin (MULTIVITAMIN WITH MINERALS) TABS tablet Take 1 tablet by mouth daily.   Yes [provider]  oxyCODONE (ROXICODONE) 15 MG immediate release tablet Take 15 mg by mouth every 6 (six) hours as needed. 01/19/22  Yes [provider]  pantoprazole (PROTONIX) 40 MG tablet Take 1 tablet (40 mg total) by mouth 2 (two) times daily before a meal. 11/12/21  Yes Amin, Jeanella Flattery, MD  potassium chloride (KLOR-CON) 10 MEQ tablet Take 1 tablet by mouth once daily 12/29/21  Yes Derek Jack, MD  prochlorperazine (COMPAZINE) 10 MG tablet Take 1 tablet (10 mg total) by mouth every 6 (six) hours as needed for nausea or vomiting. 01/04/22  Yes Derek Jack, MD  temazepam (RESTORIL) 30 MG capsule Take 1 capsule (30 mg total) by mouth at bedtime as needed for sleep. 03/09/21  Yes [name], MD  tiZANidine (ZANAFLEX) 4 MG tablet Take by mouth. 01/10/22  Yes [provider]    Physical Exam: BP 127/85   Pulse 64   Temp 98.4 F (36.9 C) (Oral)   Resp 18   SpO2 100%   General: 77 y.o. year-old female well developed well nourished in no acute distress.  Alert and oriented x3. HEENT: NCAT, EOMI, dry mucous membrane Neck: Supple, trachea medial Cardiovascular: Regular rate and rhythm with no rubs or gallops.  No thyromegaly or JVD noted.  No lower extremity edema. 2/4 pulses in all 4 extremities. Respiratory: Clear to auscultation with no wheezes or rales. Good inspiratory effort. Abdomen: Soft, nontender nondistended with normal bowel sounds x4 quadrants. Muskuloskeletal: No cyanosis, clubbing or  edema noted bilaterally Neuro: CN II-XII intact, strength 5/5 x 4, sensation, reflexes intact Skin: No ulcerative lesions noted or rashes Psychiatry: Judgement and insight appear normal. Mood is appropriate for condition and setting          Labs on Admission:  Basic Metabolic Panel: Recent Labs  Lab 02/08/22 1940  NA 135  K 5.8*  CL 108  CO2 22  GLUCOSE 97  BUN 53*  CREATININE 3.87*  CALCIUM 8.1*   Liver Function Tests: Recent Labs  Lab 02/08/22 1940  AST 46*  ALT 21  ALKPHOS 154*  BILITOT 1.0  PROT 7.4  ALBUMIN 3.5   No results for input(s): "LIPASE", "AMYLASE" in the last 168 hours. No results for input(s): "AMMONIA" in the last 168 hours. CBC: Recent Labs  Lab 02/08/22 1940  WBC 109.7*  NEUTROABS 75.7*  HGB 7.3*  HCT 23.6*  MCV 95.9  PLT 98*   Cardiac Enzymes: No results for input(s): "CKTOTAL", "CKMB", "CKMBINDEX", "TROPONINI" in the last 168 hours.  BNP (last 3 results) Recent Labs    09/02/21 1622 11/10/21 1830  BNP 569.0* 168.7*    ProBNP (last 3 results) No results for input(s): "PROBNP" in the last 8760 hours.  CBG: No results for input(s): "GLUCAP" in the last 168 hours.  Radiological Exams on Admission: DG Chest Port 1 View  Result Date: 02/08/2022 CLINICAL DATA:  Assesses suspected EXAM: PORTABLE CHEST 1 VIEW COMPARISON:  11/10/2021 FINDINGS: Right chest wall port with catheter tip overlying the SVC. Cardiac and mediastinal contours are within normal limits. No focal pulmonary opacity. No pleural effusion or pneumothorax. No acute osseous abnormality. IMPRESSION: No acute cardiopulmonary process. Electronically Signed   By: Merilyn Baba M.D.   On: 02/08/2022 18:25    EKG: I independently viewed the EKG done and my findings are as followed: Sinus bradycardia at a rate of 55 bpm  Assessment/Plan Present on Admission: . Acute kidney injury ([place])  Principal Problem:   Acute kidney injury ([place])  Acute kidney injury superimposed on  CKD stage V Dehydration Hyperkalemia Presumed UTI POA Transaminitis Chronic normocytic anemia Thrombocytopenia Myeloproliferative disorder Essential hypertension GERD   DVT prophylaxis: ***   Code Status: ***   Family Communication: ***   Disposition Plan: ***   Consults called: ***   Admission status: ***     Bernadette Hoit MD [facility] Pager [phone]  If 7PM-7AM, please contact night-coverage [email or web address] Password [facility]  02/08/2022, 10:47 PM      Review of Systems: {ROS_Text:26778} Past Medical History:  Diagnosis Date  . Acute cholangitis   . Allergy   . Anemia   . Anxiety   . Arthritis    Phreesia 03/18/2020  . Barrett's esophagus   . Cataract   . Chronic back pain   . Chronic neck pain   . CKD (chronic kidney disease) stage 3, GFR 30-59 ml/min (HCC) 10/29/2020  . Depression   . Genital herpes   . GERD (gastroesophageal reflux disease)   . H/O degenerative disc disease   . History of blood transfusion   . Hypertension   . Insomnia   . Lupus (systemic lupus erythematosus) ([place])   . Neuromuscular disorder ([place])   . Osteoarthritis   . S/P colonoscopy June 2005   normal, no polyps  . S/P endoscopy June 2005, Oct 2009   2005: short-segment Barrett's, 2009: short-segment Barrett's  . Upper respiratory tract infection 07/09/2020  . UTI (lower urinary tract infection) 11/2012   Past Surgical History:  Procedure Laterality Date  . ABDOMINAL HYSTERECTOMY    . BACK SURGERY    . BIOPSY N/A 03/20/2014   Procedure: BIOPSY;  Surgeon: Daneil Dolin, MD;  Location: [facility];  Service: Endoscopy;  Laterality: N/A;  . BIOPSY  09/14/2015   Procedure: BIOPSY;  Surgeon: Daneil Dolin, MD;  [facility];  Service: Endoscopy;;  esophageal and gastric  . BIOPSY  07/23/2019   Procedure: BIOPSY;  Surgeon: Daneil Dolin, MD;  [facility];  Service: Endoscopy;;  esophageal   . CARPAL TUNNEL RELEASE Left 2013  . cervical disectomy   2002  . CESAREAN SECTION N/A    Phreesia 03/18/2020  . CHOLECYSTECTOMY     with lysis of adhesions for sbo; "ruptured gallbladder".  . COLONOSCOPY  11/09/2011   RMR: Melanosis coli  . COLONOSCOPY WITH PROPOFOL N/A 09/14/2015   Dr. Gala Romney: diverticulosis, 3m TA removed. next TCS 09/2020.   .Marland KitchenCOLONOSCOPY WITH PROPOFOL N/A 04/30/2020   Procedure: COLONOSCOPY WITH PROPOFOL;  Surgeon: RDaneil Dolin MD;  [facility];  Service: Endoscopy;  Laterality: N/A;  PM (ASA 3)  . DENTAL SURGERY  11/2015   multiple tooth extraction  . ESOPHAGOGASTRODUODENOSCOPY  11/29/2007   salmon-colored  tongue   longest stable at  3 cm, distal esophagus as described previously status post biopsy/ Hiatal hernia, otherwise normal stomach D1 and D2  . ESOPHAGOGASTRODUODENOSCOPY  01/06/11   short segment Barrett's esophagus s/p bx/Hiatal hernia  . ESOPHAGOGASTRODUODENOSCOPY (EGD) WITH PROPOFOL N/A 03/20/2014   RJME:QASTMHDQdistal esophagus short segment barrett's, bx with no dysplasia. next egd in 03/2017  . ESOPHAGOGASTRODUODENOSCOPY (EGD) WITH PROPOFOL N/A 09/14/2015   Dr. RGala Romney Barrett's without dysplasia, gastritis benign bx, hiatal hernia. next EGD 09/2018.  .Marland KitchenESOPHAGOGASTRODUODENOSCOPY (EGD) WITH PROPOFOL N/A 07/23/2019   Procedure: ESOPHAGOGASTRODUODENOSCOPY (EGD) WITH PROPOFOL;  Surgeon: RDaneil Dolin MD;  [facility];  Service: Endoscopy;  Laterality: N/A;  3:00pm  . EYE SURGERY N/A    Phreesia 03/18/2020  . HERNIA REPAIR Right 07/2010   Dr. BZada Girt . IR IMAGING GUIDED PORT INSERTION  02/09/2021  . JOINT REPLACEMENT    . [facility]  09/14/2015   Procedure: POLYPECTOMY;  Surgeon: RDaneil Dolin MD;  [facility];  Service: Endoscopy;;  ascending colon  . right hip replacement  07/2010   went back in sept 2012 to fix  . SHOULDER ARTHROSCOPY  2008   left  . SPINE SURGERY N/A    Phreesia 03/18/2020  . TOTAL HIP REVISION Right  12/17/2012   Procedure: RIGHT TOTAL HIP REVISION;  Surgeon: MMauri Pole MD;  Location: [facility];  Service: Orthopedics;  Laterality: Right;  . WRIST SURGERY Right 2011   open reduction right wrist.   Social History:  reports that she quit smoking about 19 years ago. Her smoking use included cigarettes. She has a 6.25 pack-year smoking history. She has never used smokeless tobacco. She reports that she does not drink alcohol and does not use drugs.  Allergies  Allergen Reactions  . Bee Venom Swelling and Hives  . Norvasc [Amlodipine] Other (See Comments)    Hair loss  . Tylenol [Acetaminophen] Itching  . Hydralazine Other (See Comments)    Hair loss  . Red Dye Itching  . Percocet [Oxycodone-Acetaminophen] Rash and Other (See Comments)    Pt states, "this gives her a rash, but at home she takes oxycodone for pain relief"   . Tyloxapol Itching and Rash    Family History  Problem Relation Age of Onset  . Hypertension Mother   . Stroke Mother   . Colon cancer Neg Hx   . Anesthesia problems Neg Hx   . Hypotension Neg Hx   . Malignant hyperthermia Neg Hx   . Pseudochol deficiency  Neg Hx   . Gastric cancer Neg Hx   . Esophageal cancer Neg Hx     Prior to Admission medications   Medication Sig Start Date End Date Taking? Authorizing Provider  acyclovir (ZOVIRAX) 800 MG tablet Take 1 tablet (800 mg total) by mouth 5 (five) times daily. 04/21/21  Yes [name], MD  acyclovir ointment (ZOVIRAX) 5 % Apply 1 application. topically every 3 (three) hours. 04/21/21  Yes [name], MD  allopurinol (ZYLOPRIM) 300 MG tablet Take 1 tablet (300 mg total) by mouth daily. 10/04/21  Yes Derek Jack, MD  busPIRone (BUSPAR) 5 MG tablet Take one tablet by mouth once daily, as needed, for anxiety Patient taking differently: Take 5 mg by mouth daily as needed (anxiety). Take one tablet by mouth once daily, as needed, for anxiety 01/11/22  Yes [name], MD  cetirizine  (ZYRTEC) 10 MG tablet Take 10 mg by mouth daily. 11/26/21  Yes [provider]  citalopram (CELEXA) 20 MG tablet Take 1 tablet by mouth once daily 02/02/22  Yes [name], MD  EPINEPHrine 0.3 mg/0.3 mL IJ SOAJ injection INJECT 0.3 MLS INTO MUSCLE ONCE AS NEEDED 01/11/22  Yes Lyndal Pulley, MD  furosemide (LASIX) 20 MG tablet TAKE 1 TABLET BY MOUTH ONCE DAILY AS NEEDED 01/04/22  Yes Derek Jack, MD  hydrOXYzine (ATARAX) 25 MG tablet Take 12.5-25 mg by mouth 2 (two) times daily as needed. 01/03/22  Yes [provider]  lidocaine-prilocaine (EMLA) cream Apply 1 Application topically as needed (Access to port and pain). 11/15/21  Yes Derek Jack, MD  metoprolol tartrate (LOPRESSOR) 25 MG tablet Take one and a half tablets twice daily 01/07/22  Yes [name], MD  mirtazapine (REMERON) 15 MG tablet TAKE 1 TABLET BY MOUTH AT BEDTIME 02/02/22  Yes [name], MD  Momelotinib Dihydrochloride (OJJAARA) 100 MG TABS Take 100 mg by mouth daily.   Yes Derek Jack, MD  Multiple Vitamin (MULTIVITAMIN WITH MINERALS) TABS tablet Take 1 tablet by mouth daily.   Yes [provider]  oxyCODONE (ROXICODONE) 15 MG immediate release tablet Take 15 mg by mouth every 6 (six) hours as needed. 01/19/22  Yes [provider]  pantoprazole (PROTONIX) 40 MG tablet Take 1 tablet (40 mg total) by mouth 2 (two) times daily before a meal. 11/12/21  Yes Amin, Jeanella Flattery, MD  potassium chloride (KLOR-CON) 10 MEQ tablet Take 1 tablet by mouth once daily 12/29/21  Yes Derek Jack, MD  prochlorperazine (COMPAZINE) 10 MG tablet Take 1 tablet (10 mg total) by mouth every 6 (six) hours as needed for nausea or vomiting. 01/04/22  Yes Derek Jack, MD  temazepam (RESTORIL) 30 MG capsule Take 1 capsule (30 mg total) by mouth at bedtime as needed for sleep. 03/09/21  Yes [name], MD  tiZANidine (ZANAFLEX) 4 MG tablet Take by mouth.  01/10/22  Yes [provider]    Physical Exam: Vitals:   02/08/22 2017 02/08/22 2124 02/08/22 2130 02/08/22 2200  BP:  112/63 114/65 127/85  Pulse: 61 65  64  Resp: '16 15 15 18  '$ Temp:      TempSrc:      SpO2: 100% 98%  100%   *** Data Reviewed: {Tip this will not be part of the note when signed- Document your independent interpretation of telemetry tracing, EKG, lab, Radiology test or any other diagnostic tests. Add any new diagnostic test ordered today. (Optional):26781} {Results:26384}  Assessment and Plan:  No notes have been filed under this hospital service. Service: Hospitalist     Advance Care Planning:   Code Status: Prior ***  Consults: ***  Family Communication: ***  Severity of Illness: {Observation/Inpatient:21159}  Author: Bernadette Hoit, DO 02/08/2022 10:31 PM  For on call review [email or web address].

## 2022-02-08 NOTE — ED Notes (Signed)
Pharmacy in room  

## 2022-02-08 NOTE — ED Provider Notes (Signed)
[facility] EMERGENCY DEPARTMENT Provider Note   CSN: [id number] Arrival date & time: 02/08/22  1656     History  Chief Complaint  Patient presents with   Illness   Hypotension    Renee Waters is a 77 y.o. female presenting for evaluation of generalized weakness, body aches, increased urinary frequency with burning pain with urination, and increasing weakness.  Past medical history is significant for myelo proliferative neoplasm under the care of [name], currently on oral chemotherapy, also reported history of lupus, chronic kidney disease, GERD, anemia, most recently required a blood transfusion about 1 month ago.  She reports poor p.o. intake stating she just has no appetite or taste for any food and has had very little p.o. intake over the past several days.  She is concerned about a possible UTI, states she had some leftover antibiotics that she took over a week ago, cannot name the medication but did not help her urinary symptoms.  She denies fevers, also denies vomiting, nausea or diarrhea, denies abdominal pain.  The history is provided by the patient and a relative (Daughter is at bedside).       Home Medications Prior to Admission medications   Medication Sig Start Date End Date Taking? Authorizing Provider  acyclovir (ZOVIRAX) 800 MG tablet Take 1 tablet (800 mg total) by mouth 5 (five) times daily. 04/21/21  Yes [name], MD  acyclovir ointment (ZOVIRAX) 5 % Apply 1 application. topically every 3 (three) hours. 04/21/21  Yes [name], MD  allopurinol (ZYLOPRIM) 300 MG tablet Take 1 tablet (300 mg total) by mouth daily. 10/04/21  Yes Derek Jack, MD  busPIRone (BUSPAR) 5 MG tablet Take one tablet by mouth once daily, as needed, for anxiety Patient taking differently: Take 5 mg by mouth daily as needed (anxiety). Take one tablet by mouth once daily, as needed, for anxiety 01/11/22  Yes [name], MD  cetirizine (ZYRTEC) 10 MG  tablet Take 10 mg by mouth daily. 11/26/21  Yes [provider]  citalopram (CELEXA) 20 MG tablet Take 1 tablet by mouth once daily 02/02/22  Yes [name], MD  EPINEPHrine 0.3 mg/0.3 mL IJ SOAJ injection INJECT 0.3 MLS INTO MUSCLE ONCE AS NEEDED 01/11/22  Yes Lyndal Pulley, MD  furosemide (LASIX) 20 MG tablet TAKE 1 TABLET BY MOUTH ONCE DAILY AS NEEDED 01/04/22  Yes Derek Jack, MD  hydrOXYzine (ATARAX) 25 MG tablet Take 12.5-25 mg by mouth 2 (two) times daily as needed. 01/03/22  Yes [provider]  lidocaine-prilocaine (EMLA) cream Apply 1 Application topically as needed (Access to port and pain). 11/15/21  Yes Derek Jack, MD  metoprolol tartrate (LOPRESSOR) 25 MG tablet Take one and a half tablets twice daily 01/07/22  Yes [name], MD  mirtazapine (REMERON) 15 MG tablet TAKE 1 TABLET BY MOUTH AT BEDTIME 02/02/22  Yes [name], MD  Momelotinib Dihydrochloride (OJJAARA) 100 MG TABS Take 100 mg by mouth daily.   Yes Derek Jack, MD  Multiple Vitamin (MULTIVITAMIN WITH MINERALS) TABS tablet Take 1 tablet by mouth daily.   Yes [provider]  oxyCODONE (ROXICODONE) 15 MG immediate release tablet Take 15 mg by mouth every 6 (six) hours as needed. 01/19/22  Yes [provider]  pantoprazole (PROTONIX) 40 MG tablet Take 1 tablet (40 mg total) by mouth 2 (two) times daily before a meal. 11/12/21  Yes Amin, Ankit Chirag, MD  potassium chloride (KLOR-CON) 10 MEQ tablet Take 1  tablet by mouth once daily 12/29/21  Yes Derek Jack, MD  prochlorperazine (COMPAZINE) 10 MG tablet Take 1 tablet (10 mg total) by mouth every 6 (six) hours as needed for nausea or vomiting. 01/04/22  Yes Derek Jack, MD  temazepam (RESTORIL) 30 MG capsule Take 1 capsule (30 mg total) by mouth at bedtime as needed for sleep. 03/09/21  Yes [name], MD  tiZANidine (ZANAFLEX) 4 MG tablet Take by mouth. 01/10/22  Yes  [provider]      Allergies    Bee venom, Norvasc [amlodipine], Tylenol [acetaminophen], Hydralazine, Red dye, Percocet [oxycodone-acetaminophen], and Tyloxapol    Review of Systems   Review of Systems  Constitutional:  Positive for activity change, appetite change and fatigue. Negative for fever.  HENT:  Negative for congestion and sore throat.   Eyes: Negative.   Respiratory:  Negative for chest tightness and shortness of breath.   Cardiovascular:  Negative for chest pain.  Gastrointestinal:  Negative for abdominal pain and nausea.  Genitourinary:  Positive for dysuria.  Musculoskeletal:  Negative for arthralgias, joint swelling and neck pain.  Skin: Negative.  Negative for rash and wound.  Neurological:  Positive for weakness. Negative for dizziness, light-headedness, numbness and headaches.  Psychiatric/Behavioral: Negative.      Physical Exam Updated Vital Signs BP 115/65   Pulse 64   Temp 98.4 F (36.9 C) (Oral)   Resp 18   SpO2 98%  Physical Exam Vitals and nursing note reviewed.  Constitutional:      Appearance: She is well-developed.  HENT:     Head: Normocephalic and atraumatic.  Eyes:     Conjunctiva/sclera: Conjunctivae normal.  Cardiovascular:     Rate and Rhythm: Normal rate and regular rhythm.     Heart sounds: Normal heart sounds.  Pulmonary:     Effort: Pulmonary effort is normal.     Breath sounds: Normal breath sounds. No wheezing.  Abdominal:     General: Bowel sounds are normal.     Palpations: Abdomen is soft.     Tenderness: There is no abdominal tenderness. There is no guarding or rebound.  Musculoskeletal:        General: Normal range of motion.     Cervical back: Normal range of motion.  Skin:    General: Skin is warm and dry.  Neurological:     Mental Status: She is alert.     ED Results / Procedures / Treatments   Labs (all labs ordered are listed, but only abnormal results are displayed) Labs Reviewed  COMPREHENSIVE  METABOLIC PANEL - Abnormal; Notable for the following components:      Result Value   Potassium 5.8 (*)    BUN 53 (*)    Creatinine, Ser 3.87 (*)    Calcium 8.1 (*)    AST 46 (*)    Alkaline Phosphatase 154 (*)    GFR, Estimated 12 (*)    All other components within normal limits  CBC WITH DIFFERENTIAL/PLATELET - Abnormal; Notable for the following components:   WBC 109.7 (*)    RBC 2.46 (*)    Hemoglobin 7.3 (*)    HCT 23.6 (*)    RDW 24.8 (*)    Platelets 98 (*)    nRBC 3.0 (*)    Neutro Abs 75.7 (*)    Monocytes Absolute 9.9 (*)    Eosinophils Absolute 2.2 (*)    Abs Immature Granulocytes 19.75 (*)    All other components within normal limits  PROTIME-INR - Abnormal; Notable for the following components:   Prothrombin Time 19.6 (*)    INR 1.7 (*)    All other components within normal limits  URINALYSIS, ROUTINE W REFLEX MICROSCOPIC - Abnormal; Notable for the following components:   Color, Urine AMBER (*)    APPearance CLOUDY (*)    Hgb urine dipstick SMALL (*)    Protein, ur >=300 (*)    Nitrite POSITIVE (*)    Leukocytes,Ua LARGE (*)    WBC, UA >50 (*)    All other components within normal limits  RESP PANEL BY RT-PCR (RSV, FLU A&B, COVID)  RVPGX2  CULTURE, BLOOD (ROUTINE X 2)  CULTURE, BLOOD (ROUTINE X 2)  LACTIC ACID, PLASMA  LACTIC ACID, PLASMA    EKG EKG Interpretation  Date/Time:  Tuesday February 08 2022 18:11:56 EST Ventricular Rate:  55 PR Interval:  179 QRS Duration: 91 QT Interval:  480 QTC Calculation: 460 R Axis:   37 Text Interpretation: Sinus rhythm ST elevation, consider inferior injury No significant change since prior 9/23 Confirmed by Aletta Edouard [phone]) on 02/08/2022 6:38:23 PM  Radiology DG Chest Port 1 View  Result Date: 02/08/2022 CLINICAL DATA:  Assesses suspected EXAM: PORTABLE CHEST 1 VIEW COMPARISON:  11/10/2021 FINDINGS: Right chest wall port with catheter tip overlying the SVC. Cardiac and mediastinal contours are within  normal limits. No focal pulmonary opacity. No pleural effusion or pneumothorax. No acute osseous abnormality. IMPRESSION: No acute cardiopulmonary process. Electronically Signed   By: Merilyn Baba M.D.   On: 02/08/2022 18:25    Procedures Procedures    Medications Ordered in ED Medications  lactated ringers bolus 1,800 mL (0 mLs Intravenous Stopped 02/08/22 2125)  cefTRIAXone (ROCEPHIN) 1 g in sodium chloride 0.9 % 100 mL IVPB (0 g Intravenous Stopped 02/08/22 2125)  sodium zirconium cyclosilicate (LOKELMA) packet 10 g (10 g Oral Given 02/08/22 2258)  oxyCODONE (Oxy IR/ROXICODONE) immediate release tablet 15 mg (15 mg Oral Given 02/08/22 2213)    ED Course/ Medical Decision Making/ A&P                           Medical Decision Making Patient presenting with generalized weakness, body aches, dysuria, currently under the care of [name] for myelo proliferative disease on oral chemotherapy.  She denies fevers, she has had no p.o. intake over the past 48 hours secondary to generalized malaise, lack of appetite.  Initially she presented with severe hypotension with a blood pressure of 62/45, she remained afebrile and had no tachycardia, no oxygen requirement, she did respond to IV fluids, her initial lactate was normal range, sepsis less likely, probably simple dehydration due to lack of p.o. intake.  She does have significant kidney injury with an elevation in her potassium as well.  Of note, she is on oral potassium supplementation secondary to diuretic use.  Patient will need admission, urine has now also resulted, she does have a UTI.  She was given an IV dose of Rocephin as part of her sepsis workup.  Amount and/or Complexity of Data Reviewed Labs: ordered.    Details: Per above. Radiology: ordered and independent interpretation performed.    Details: Chest x-ray reviewed, agree with interpretation. Discussion of management or test interpretation with external provider(s): Discussed with  Dr. Josephine Cables who accepts patient for admission.  Risk Prescription drug management.    CRITICAL CARE Performed by: Evalee Jefferson Total critical care time: 40 minutes Critical care time was  exclusive of separately billable procedures and treating other patients. Critical care was necessary to treat or prevent imminent or life-threatening deterioration. Critical care was time spent personally by me on the following activities: development of treatment plan with patient and/or surrogate as well as nursing, discussions with consultants, evaluation of patient's response to treatment, examination of patient, obtaining history from patient or surrogate, ordering and performing treatments and interventions, ordering and review of laboratory studies, ordering and review of radiographic studies, pulse oximetry and re-evaluation of patient's condition.         Final Clinical Impression(s) / ED Diagnoses Final diagnoses:  AKI (acute kidney injury) ([place])  Hyperkalemia  Dehydration  Acute cystitis with hematuria    Rx / DC Orders ED Discharge Orders     None         Landis Martins 02/08/22 2300    Hayden Rasmussen, MD 02/09/22 1151

## 2022-02-08 NOTE — ED Notes (Signed)
Family requested port be accessed and blood cultures drawn from the port even though, in most cases, it is not standard protocol. Family wanted to speak with MD, Renee Waters, about blood cultures being drawn from port. MD verbal to ony draw first set of cultures from port and not to delay antibiotics.

## 2022-02-08 NOTE — ED Notes (Signed)
One set of cultures collected from port

## 2022-02-08 NOTE — H&P (Addendum)
History and Physical    Patient: Renee Waters DOB: 1945/11/08 DOA: 02/08/2022 DOS: the patient was seen and examined on 02/09/2022 PCP: [name], MD  Patient coming from: Home  Chief Complaint:  Chief Complaint  Patient presents with   Illness   Hypotension   HPI: Renee Waters is a 77 y.o. female with medical history significant of  myeloproliferative neoplasm (follows with [name]), hypertension, GERD, chronic back pain, CKD stage III who presents to the emergency department due to generalized weakness, body aches, increased urinary frequency with burning sensation on urination.  She complained of several days of not drinking enough fluid and having poor appetite.  Patient states that she has some leftover antibiotics taken about a week ago due to concern about possible UTI which was not helpful (she does not remember the name of the antibiotics).  Patient denies chest pain, shortness of breath, nausea, vomiting or diarrhea.  ED Course:  In the emergency department, she was intermittently bradycardic and was hypotensive on arrival with BP of 62/45, other vital signs were within normal range.  Workup in the ED showed WBC of 100.7, hemoglobin 7.3, hematocrit 33.6, platelets 98.  BMP was normal except for potassium of 5.8 and BUN/creatinine of 53/3.87 (baseline creatinine ranged within 1.3-1.6), Influenza A, B, SARS coronavirus 2, RSV was negative.  Urinalysis was positive for UTI. Chest x-ray showed no acute cardiopulmonary process Patient was treated with IV ceftriaxone, oxycodone 50 mg x 1 was given and Lokelma was given due to hyperkalemia.  Hospitalist was asked to admit patient for further evaluation and management.  Review of Systems: Review of systems as noted in the HPI. All other systems reviewed and are negative.   Past Medical History:  Diagnosis Date   Acute cholangitis    Allergy    Anemia    Anxiety    Arthritis    Phreesia 03/18/2020    Barrett's esophagus    Cataract    Chronic back pain    Chronic neck pain    CKD (chronic kidney disease) stage 3, GFR 30-59 ml/min ([place]) 10/29/2020   Depression    Genital herpes    GERD (gastroesophageal reflux disease)    H/O degenerative disc disease    History of blood transfusion    Hypertension    Insomnia    Lupus (systemic lupus erythematosus) ([place])    Neuromuscular disorder ([place])    Osteoarthritis    S/P colonoscopy June 2005   normal, no polyps   S/P endoscopy June 2005, Oct 2009   2005: short-segment Barrett's, 2009: short-segment Barrett's   Upper respiratory tract infection 07/09/2020   UTI (lower urinary tract infection) 11/2012   Past Surgical History:  Procedure Laterality Date   ABDOMINAL HYSTERECTOMY     BACK SURGERY     BIOPSY N/A 03/20/2014   Procedure: BIOPSY;  Surgeon: Daneil Dolin, MD;  Location: [facility];  Service: Endoscopy;  Laterality: N/A;   BIOPSY  09/14/2015   Procedure: BIOPSY;  Surgeon: Daneil Dolin, MD;  Location: [facility];  Service: Endoscopy;;  esophageal and gastric   BIOPSY  07/23/2019   Procedure: BIOPSY;  Surgeon: Daneil Dolin, MD;  [facility];  Service: Endoscopy;;  esophageal    CARPAL TUNNEL RELEASE Left 2013   cervical disectomy  2002   CESAREAN SECTION N/A    Phreesia 03/18/2020   CHOLECYSTECTOMY     with lysis of adhesions for sbo; "ruptured gallbladder".   COLONOSCOPY  11/09/2011   RMR: Melanosis coli   COLONOSCOPY WITH PROPOFOL N/A 09/14/2015   Dr. Gala Romney: diverticulosis, 52m TA removed. next TCS 09/2020.    COLONOSCOPY WITH PROPOFOL N/A 04/30/2020   Procedure: COLONOSCOPY WITH PROPOFOL;  Surgeon: RDaneil Dolin MD;  [facility];  Service: Endoscopy;  Laterality: N/A;  PM (ASA 3)   DENTAL SURGERY  11/2015   multiple tooth extraction   ESOPHAGOGASTRODUODENOSCOPY  11/29/2007   salmon-colored  tongue   longest stable at  3 cm, distal esophagus as described previously status post biopsy/ Hiatal hernia,  otherwise normal stomach D1 and D2   ESOPHAGOGASTRODUODENOSCOPY  01/06/11   short segment Barrett's esophagus s/p bx/Hiatal hernia   ESOPHAGOGASTRODUODENOSCOPY (EGD) WITH PROPOFOL N/A 03/20/2014   RHBZ:JIRCVELFdistal esophagus short segment barrett's, bx with no dysplasia. next egd in 03/2017   ESOPHAGOGASTRODUODENOSCOPY (EGD) WITH PROPOFOL N/A 09/14/2015   Dr. RGala Romney Barrett's without dysplasia, gastritis benign bx, hiatal hernia. next EGD 09/2018.   ESOPHAGOGASTRODUODENOSCOPY (EGD) WITH PROPOFOL N/A 07/23/2019   Procedure: ESOPHAGOGASTRODUODENOSCOPY (EGD) WITH PROPOFOL;  Surgeon: RDaneil Dolin MD;  [facility];  Service: Endoscopy;  Laterality: N/A;  3:00pm   EYE SURGERY N/A    Phreesia 03/18/2020   HERNIA REPAIR Right 07/2010   Dr. BZada Girt  IR IMAGING GUIDED PORT INSERTION  02/09/2021   JOINT REPLACEMENT     [facility]  POLYPECTOMY  09/14/2015   Procedure: POLYPECTOMY;  Surgeon: RDaneil Dolin MD;  [facility];  Service: Endoscopy;;  ascending colon   right hip replacement  07/2010   went back in sept 2012 to fix   SHOULDER ARTHROSCOPY  2008   left   SPINE SURGERY N/A    Phreesia 03/18/2020   TOTAL HIP REVISION Right 12/17/2012   Procedure: RIGHT TOTAL HIP REVISION;  Surgeon: MMauri Pole MD;  [facility];  Service: Orthopedics;  Laterality: Right;   WRIST SURGERY Right 2011   open reduction right wrist.    Social History:  reports that she quit smoking about 19 years ago. Her smoking use included cigarettes. She has a 6.25 pack-year smoking history. She has never used smokeless tobacco. She reports that she does not drink alcohol and does not use drugs.   Allergies  Allergen Reactions   Bee Venom Swelling and Hives   Norvasc [Amlodipine] Other (See Comments)    Hair loss   Tylenol [Acetaminophen] Itching   Hydralazine Other (See Comments)    Hair loss   Red Dye Itching   Percocet [Oxycodone-Acetaminophen]  Rash and Other (See Comments)    Pt states, "this gives her a rash, but at home she takes oxycodone for pain relief"    Tyloxapol Itching and Rash    Family History  Problem Relation Age of Onset   Hypertension Mother    Stroke Mother    Colon cancer Neg Hx    Anesthesia problems Neg Hx    Hypotension Neg Hx    Malignant hyperthermia Neg Hx    Pseudochol deficiency Neg Hx    Gastric cancer Neg Hx    Esophageal cancer Neg Hx      Prior to Admission medications   Medication Sig Start Date End Date Taking? Authorizing Provider  acyclovir (ZOVIRAX) 800 MG tablet Take 1 tablet (800 mg total) by mouth 5 (five) times daily. 04/21/21  Yes SFayrene Helper MD  acyclovir ointment (ZOVIRAX) 5 % Apply 1 application. topically every  3 (three) hours. 04/21/21  Yes [name], MD  allopurinol (ZYLOPRIM) 300 MG tablet Take 1 tablet (300 mg total) by mouth daily. 10/04/21  Yes Derek Jack, MD  busPIRone (BUSPAR) 5 MG tablet Take one tablet by mouth once daily, as needed, for anxiety Patient taking differently: Take 5 mg by mouth daily as needed (anxiety). Take one tablet by mouth once daily, as needed, for anxiety 01/11/22  Yes [name], MD  cetirizine (ZYRTEC) 10 MG tablet Take 10 mg by mouth daily. 11/26/21  Yes [provider]  citalopram (CELEXA) 20 MG tablet Take 1 tablet by mouth once daily 02/02/22  Yes [name], MD  EPINEPHrine 0.3 mg/0.3 mL IJ SOAJ injection INJECT 0.3 MLS INTO MUSCLE ONCE AS NEEDED 01/11/22  Yes Lyndal Pulley, MD  furosemide (LASIX) 20 MG tablet TAKE 1 TABLET BY MOUTH ONCE DAILY AS NEEDED 01/04/22  Yes Derek Jack, MD  hydrOXYzine (ATARAX) 25 MG tablet Take 12.5-25 mg by mouth 2 (two) times daily as needed. 01/03/22  Yes [provider]  lidocaine-prilocaine (EMLA) cream Apply 1 Application topically as needed (Access to port and pain). 11/15/21  Yes Derek Jack, MD  metoprolol tartrate (LOPRESSOR) 25  MG tablet Take one and a half tablets twice daily 01/07/22  Yes [name], MD  mirtazapine (REMERON) 15 MG tablet TAKE 1 TABLET BY MOUTH AT BEDTIME 02/02/22  Yes [name], MD  Momelotinib Dihydrochloride (OJJAARA) 100 MG TABS Take 100 mg by mouth daily.   Yes Derek Jack, MD  Multiple Vitamin (MULTIVITAMIN WITH MINERALS) TABS tablet Take 1 tablet by mouth daily.   Yes [provider]  oxyCODONE (ROXICODONE) 15 MG immediate release tablet Take 15 mg by mouth every 6 (six) hours as needed. 01/19/22  Yes [provider]  pantoprazole (PROTONIX) 40 MG tablet Take 1 tablet (40 mg total) by mouth 2 (two) times daily before a meal. 11/12/21  Yes Amin, Jeanella Flattery, MD  potassium chloride (KLOR-CON) 10 MEQ tablet Take 1 tablet by mouth once daily 12/29/21  Yes Derek Jack, MD  prochlorperazine (COMPAZINE) 10 MG tablet Take 1 tablet (10 mg total) by mouth every 6 (six) hours as needed for nausea or vomiting. 01/04/22  Yes Derek Jack, MD  temazepam (RESTORIL) 30 MG capsule Take 1 capsule (30 mg total) by mouth at bedtime as needed for sleep. 03/09/21  Yes [name], MD  tiZANidine (ZANAFLEX) 4 MG tablet Take by mouth. 01/10/22  Yes [provider]    Physical Exam: BP 119/88 (BP Location: Left Arm)   Pulse 72   Temp 98.3 F (36.8 C) (Oral)   Resp 16   SpO2 99%   General: 77 y.o. year-old female well developed well nourished in no acute distress.  Alert and oriented x3. HEENT: NCAT, EOMI, dry mucous membrane Neck: Supple, trachea medial Cardiovascular: Regular rate and rhythm with no rubs or gallops.  No thyromegaly or JVD noted.  No lower extremity edema. 2/4 pulses in all 4 extremities. Respiratory: Clear to auscultation with no wheezes or rales. Good inspiratory effort. Abdomen: Soft, nontender nondistended with normal bowel sounds x4 quadrants. Muskuloskeletal: No cyanosis, clubbing or edema noted bilaterally Neuro:  CN II-XII intact, strength 5/5 x 4, sensation, reflexes intact Skin: No ulcerative lesions noted or rashes Psychiatry: Judgement and insight appear normal. Mood is appropriate for condition and setting          Labs on Admission:  Basic Metabolic Panel: Recent Labs  Lab 02/08/22 1940  NA 135  K 5.8*  CL 108  CO2 22  GLUCOSE 97  BUN 53*  CREATININE 3.87*  CALCIUM 8.1*   Liver Function Tests: Recent Labs  Lab 02/08/22 1940  AST 46*  ALT 21  ALKPHOS 154*  BILITOT 1.0  PROT 7.4  ALBUMIN 3.5   No results for input(s): "LIPASE", "AMYLASE" in the last 168 hours. No results for input(s): "AMMONIA" in the last 168 hours. CBC: Recent Labs  Lab 02/08/22 1940  WBC 109.7*  NEUTROABS 75.7*  HGB 7.3*  HCT 23.6*  MCV 95.9  PLT 98*   Cardiac Enzymes: No results for input(s): "CKTOTAL", "CKMB", "CKMBINDEX", "TROPONINI" in the last 168 hours.  BNP (last 3 results) Recent Labs    09/02/21 1622 11/10/21 1830  BNP 569.0* 168.7*    ProBNP (last 3 results) No results for input(s): "PROBNP" in the last 8760 hours.  CBG: No results for input(s): "GLUCAP" in the last 168 hours.  Radiological Exams on Admission: DG Chest Port 1 View  Result Date: 02/08/2022 CLINICAL DATA:  Assesses suspected EXAM: PORTABLE CHEST 1 VIEW COMPARISON:  11/10/2021 FINDINGS: Right chest wall port with catheter tip overlying the SVC. Cardiac and mediastinal contours are within normal limits. No focal pulmonary opacity. No pleural effusion or pneumothorax. No acute osseous abnormality. IMPRESSION: No acute cardiopulmonary process. Electronically Signed   By: Merilyn Baba M.D.   On: 02/08/2022 18:25    EKG: I independently viewed the EKG done and my findings are as followed: Sinus bradycardia at a rate of 55 bpm  Assessment/Plan Present on Admission:  Acute kidney injury superimposed on chronic kidney disease ([place])  UTI (urinary tract infection)  Transaminitis  Normocytic anemia  Thrombocytopenia  (HCC)  GERD without esophagitis  MPN (myeloproliferative neoplasm) ([place])  Principal Problem:   Acute kidney injury superimposed on chronic kidney disease (HCC) Active Problems:   GERD without esophagitis   MPN (myeloproliferative neoplasm) (HCC)   Normocytic anemia   Transaminitis   Thrombocytopenia (HCC)   UTI (urinary tract infection)   Hyperkalemia   Essential hypertension  Acute kidney injury superimposed on CKD stage V Dehydration BUN/creatinine of 53/3.87 (baseline creatinine ranged within 1.3-1.6), Continue gentle hydration Renally adjust medications, avoid nephrotoxic agents/dehydration/hypotension  Hyperkalemia K+ 5.8, Lokelma was given  UTI POA Patient complained of increased urinary frequency with burning sensation on urination Urinalysis was positive for UTI Patient was started on IV ceftriaxone, we shall continue with same at this time. Urine culture pending  Transaminitis AST 46,, ALP 154 Continue to monitor liver enzymes  Chronic normocytic anemia Hemoglobin is 7.3, baseline around 8.0 Continue to monitor hemoglobin with morning labs  Chronic thrombocytopenia Platelets 98, continue to monitor platelet level with morning labs  Myeloproliferative neoplasm WBC 109.7; patient follows with [name]  Essential hypertension Continue metoprolol  GERD Continue Protonix   DVT prophylaxis: Heparin subcu  Code Status: Full code  Family Communication: None at bedside  Consults: None  Severity of Illness: The appropriate patient status for this patient is INPATIENT. Inpatient status is judged to be reasonable and necessary in order to provide the required intensity of service to ensure the patient's safety. The patient's presenting symptoms, physical exam findings, and initial radiographic and laboratory data in the context of their chronic comorbidities is felt to place them at high risk for further clinical deterioration. Furthermore, it is not  anticipated that the patient will be medically stable for discharge from the hospital within 2 midnights of admission.   *  I certify that at the point of admission it is my clinical judgment that the patient will require inpatient hospital care spanning beyond 2 midnights from the point of admission due to high intensity of service, high risk for further deterioration and high frequency of surveillance required.*  Author: Bernadette Hoit, DO 02/09/2022 1:21 AM  For on call review [email or web address].

## 2022-02-09 DIAGNOSIS — I10 Essential (primary) hypertension: Secondary | ICD-10-CM | POA: Insufficient documentation

## 2022-02-09 DIAGNOSIS — E875 Hyperkalemia: Secondary | ICD-10-CM | POA: Insufficient documentation

## 2022-02-09 DIAGNOSIS — N390 Urinary tract infection, site not specified: Secondary | ICD-10-CM

## 2022-02-09 DIAGNOSIS — D649 Anemia, unspecified: Secondary | ICD-10-CM | POA: Diagnosis not present

## 2022-02-09 DIAGNOSIS — D696 Thrombocytopenia, unspecified: Secondary | ICD-10-CM | POA: Diagnosis not present

## 2022-02-09 DIAGNOSIS — N179 Acute kidney failure, unspecified: Secondary | ICD-10-CM | POA: Diagnosis not present

## 2022-02-09 LAB — CBC
HCT: 22.6 % — ABNORMAL LOW (ref 36.0–46.0)
Hemoglobin: 7 g/dL — ABNORMAL LOW (ref 12.0–15.0)
MCH: 29.7 pg (ref 26.0–34.0)
MCHC: 31 g/dL (ref 30.0–36.0)
MCV: 95.8 fL (ref 80.0–100.0)
Platelets: 99 10*3/uL — ABNORMAL LOW (ref 150–400)
RBC: 2.36 MIL/uL — ABNORMAL LOW (ref 3.87–5.11)
RDW: 25.1 % — ABNORMAL HIGH (ref 11.5–15.5)
WBC: 101.3 10*3/uL (ref 4.0–10.5)
nRBC: 3.1 % — ABNORMAL HIGH (ref 0.0–0.2)

## 2022-02-09 LAB — COMPREHENSIVE METABOLIC PANEL
ALT: 19 U/L (ref 0–44)
AST: 41 U/L (ref 15–41)
Albumin: 3 g/dL — ABNORMAL LOW (ref 3.5–5.0)
Alkaline Phosphatase: 137 U/L — ABNORMAL HIGH (ref 38–126)
Anion gap: 8 (ref 5–15)
BUN: 48 mg/dL — ABNORMAL HIGH (ref 8–23)
CO2: 23 mmol/L (ref 22–32)
Calcium: 8.2 mg/dL — ABNORMAL LOW (ref 8.9–10.3)
Chloride: 107 mmol/L (ref 98–111)
Creatinine, Ser: 3 mg/dL — ABNORMAL HIGH (ref 0.44–1.00)
GFR, Estimated: 16 mL/min — ABNORMAL LOW (ref >60)
Glucose, Bld: 109 mg/dL — ABNORMAL HIGH (ref 70–99)
Potassium: 3.7 mmol/L (ref 3.5–5.1)
Sodium: 138 mmol/L (ref 135–145)
Total Bilirubin: 0.8 mg/dL (ref 0.3–1.2)
Total Protein: 6.6 g/dL (ref 6.5–8.1)

## 2022-02-09 LAB — PHOSPHORUS: Phosphorus: 3.8 mg/dL (ref 2.5–4.6)

## 2022-02-09 LAB — MAGNESIUM: Magnesium: 1.9 mg/dL (ref 1.7–2.4)

## 2022-02-09 MED ORDER — ALLOPURINOL 100 MG PO TABS
300.0000 mg | ORAL_TABLET | Freq: Every day | ORAL | Status: DC
  Administered 2022-02-09: 300 mg via ORAL
  Filled 2022-02-09: qty 3

## 2022-02-09 MED ORDER — BUSPIRONE HCL 5 MG PO TABS: 5.0000 mg | ORAL_TABLET | Freq: Every day | ORAL | Status: DC | PRN

## 2022-02-09 MED ORDER — SODIUM CHLORIDE 0.9 % IV SOLN
2.0000 g | INTRAVENOUS | Status: DC
  Administered 2022-02-09: 2 g via INTRAVENOUS
  Filled 2022-02-09: qty 20

## 2022-02-09 MED ORDER — HYDROXYZINE HCL 10 MG PO TABS
10.0000 mg | ORAL_TABLET | Freq: Two times a day (BID) | ORAL | Status: DC | PRN
  Administered 2022-02-09: 10 mg via ORAL
  Filled 2022-02-09: qty 1

## 2022-02-09 MED ORDER — PANTOPRAZOLE SODIUM 40 MG PO TBEC
40.0000 mg | DELAYED_RELEASE_TABLET | Freq: Two times a day (BID) | ORAL | Status: DC
  Administered 2022-02-09 – 2022-02-10 (×3): 40 mg via ORAL
  Filled 2022-02-09 (×3): qty 1

## 2022-02-09 MED ORDER — CETIRIZINE HCL 5 MG/5ML PO SOLN
10.0000 mg | Freq: Once | ORAL | Status: DC
  Filled 2022-02-09: qty 10

## 2022-02-09 MED ORDER — LORATADINE 10 MG PO TABS
10.0000 mg | ORAL_TABLET | Freq: Every day | ORAL | Status: DC
  Administered 2022-02-09 – 2022-02-10 (×2): 10 mg via ORAL
  Filled 2022-02-09 (×2): qty 1

## 2022-02-09 MED ORDER — MOMELOTINIB DIHYDROCHLORIDE 100 MG PO TABS: 100.0000 mg | ORAL_TABLET | Freq: Every day | ORAL | Status: DC

## 2022-02-09 MED ORDER — OXYCODONE HCL 5 MG PO TABS
15.0000 mg | ORAL_TABLET | Freq: Four times a day (QID) | ORAL | Status: DC | PRN
  Administered 2022-02-09 – 2022-02-10 (×5): 15 mg via ORAL
  Filled 2022-02-09 (×5): qty 3

## 2022-02-09 MED ORDER — POTASSIUM CHLORIDE CRYS ER 10 MEQ PO TBCR
10.0000 meq | EXTENDED_RELEASE_TABLET | Freq: Every day | ORAL | Status: DC
  Administered 2022-02-10: 10 meq via ORAL
  Filled 2022-02-09 (×2): qty 1

## 2022-02-09 MED ORDER — LACTATED RINGERS IV SOLN: INTRAVENOUS | Status: DC

## 2022-02-09 MED ORDER — METOPROLOL TARTRATE 25 MG PO TABS
25.0000 mg | ORAL_TABLET | Freq: Two times a day (BID) | ORAL | Status: DC
  Administered 2022-02-09 – 2022-02-10 (×3): 25 mg via ORAL
  Filled 2022-02-09 (×3): qty 1

## 2022-02-09 MED ORDER — CITALOPRAM HYDROBROMIDE 20 MG PO TABS
20.0000 mg | ORAL_TABLET | Freq: Every day | ORAL | Status: DC
  Administered 2022-02-09 – 2022-02-10 (×2): 20 mg via ORAL
  Filled 2022-02-09 (×2): qty 1

## 2022-02-09 MED ORDER — MIRTAZAPINE 15 MG PO TABS
15.0000 mg | ORAL_TABLET | Freq: Every day | ORAL | Status: DC
  Administered 2022-02-09: 15 mg via ORAL
  Filled 2022-02-09: qty 1

## 2022-02-09 MED ORDER — CHLORHEXIDINE GLUCONATE CLOTH 2 % EX PADS
6.0000 | MEDICATED_PAD | Freq: Every day | CUTANEOUS | Status: DC
  Administered 2022-02-09: 6 via TOPICAL

## 2022-02-09 MED ORDER — ONDANSETRON HCL 4 MG PO TABS: 4.0000 mg | ORAL_TABLET | Freq: Four times a day (QID) | ORAL | Status: DC | PRN

## 2022-02-09 MED ORDER — ONDANSETRON HCL 4 MG/2ML IJ SOLN
4.0000 mg | Freq: Four times a day (QID) | INTRAMUSCULAR | Status: DC | PRN
  Administered 2022-02-09: 4 mg via INTRAVENOUS
  Filled 2022-02-09: qty 2

## 2022-02-09 MED ORDER — HEPARIN SODIUM (PORCINE) 5000 UNIT/ML IJ SOLN
5000.0000 [IU] | Freq: Three times a day (TID) | INTRAMUSCULAR | Status: DC
  Administered 2022-02-09 – 2022-02-10 (×4): 5000 [IU] via SUBCUTANEOUS
  Filled 2022-02-09 (×3): qty 1

## 2022-02-09 MED ORDER — SODIUM CHLORIDE 0.9 % IV SOLN: 1.0000 g | INTRAVENOUS | Status: DC

## 2022-02-09 NOTE — TOC Initial Note (Signed)
Transition of Care [facility]) - Initial/Assessment Note    Patient Details  Name: Renee Waters MRN: [id number] Date of Birth: [birth year]  [facility]) CM/SW Contact:    Iona Beard, [place] Phone Number: 02/09/2022, 11:35 AM  Clinical Narrative:                 Pt is high risk for readmission. CSW spoke with pt to complete assessment. Pt lives with her spouse. Pt needs some assistance with completing her ADLs but states that her husband is not always able to assist. CSW inquired about pts interest in [facility] being set up at D/C. Pt is agreeable and would like [place] set up. CSW spoke to [place] with [facility] who accepted pts referral for [facility] PT/RN/Aide services. Pt has a cane and rollator to use at home when needed. TOC to request MD place [facility] orders prior to D/C. TOC to follow.   Expected Discharge Plan: [place] Barriers to Discharge: Continued Medical Work up   Patient Goals and CMS Choice Patient states their goals for this hospitalization and ongoing recovery are:: home with [facility] CMS Medicare.gov Compare Post Acute Care list provided to:: Patient Choice offered to / list presented to : Patient      Expected Discharge Plan and Services In-house Referral: Clinical Social Work Discharge Planning Services: CM Consult Post Acute Care Choice: [place] arrangements for the past 2 months: [place]: PT, RN, Nurse's Aide [place] Agency: [place] ([facility]) Date HH Agency Contacted: 02/09/22   Representative spoke with at [place]: Caryl Pina  Prior Living Arrangements/Services Living arrangements for the past 2 months: [place] Lives with:: Spouse Patient language and need for interpreter reviewed:: Yes Do you feel safe going back to the place where you live?: Yes      Need for Family Participation in Patient Care: Yes (Comment) Care giver support system in place?: Yes  (comment) Current home services: DME (rollator and cane) Criminal Activity/Legal Involvement Pertinent to Current Situation/Hospitalization: No - Comment as needed  Activities of Daily Living Home Assistive Devices/Equipment: Environmental consultant (specify type) ADL Screening (condition at time of admission) Patient's cognitive ability adequate to safely complete daily activities?: Yes Is the patient deaf or have difficulty hearing?: No Does the patient have difficulty seeing, even when wearing glasses/contacts?: No Does the patient have difficulty concentrating, remembering, or making decisions?: No Patient able to express need for assistance with ADLs?: Yes Does the patient have difficulty dressing or bathing?: No Independently performs ADLs?: No Communication: Independent Dressing (OT): Needs assistance Is this a change from baseline?: Change from baseline, expected to last <3days Grooming: Needs assistance Is this a change from baseline?: Change from baseline, expected to last <3 days Feeding: Needs assistance Is this a change from baseline?: Change from baseline, expected to last <3 days Bathing: Needs assistance Is this a change from baseline?: Change from baseline, expected to last <3 days Toileting: Needs assistance Is this a change from baseline?: Change from baseline, expected to last <3 days In/Out Bed: Needs assistance Is this a change from baseline?: Change from baseline, expected to last <3 days Walks in Home: Needs assistance Is this a change from baseline?: Change from baseline, expected to last <3 days Does the patient have difficulty walking or climbing stairs?: No Weakness  of Legs: Both Weakness of Arms/Hands: None  Permission Sought/Granted                  Emotional Assessment Appearance:: Appears stated age Attitude/Demeanor/Rapport: Engaged Affect (typically observed): Accepting Orientation: : Oriented to Self, Oriented to Place, Oriented to Situation, Oriented to   Time Alcohol / Substance Use: Not Applicable Psych Involvement: No (comment)  Admission diagnosis:  Dehydration [E86.0] Hyperkalemia [E87.5] Acute cystitis with hematuria [N30.01] AKI (acute kidney injury) ([place]) [N17.9] Acute kidney injury ([place]) [N17.9] Patient Active Problem List   Diagnosis Date Noted   Hyperkalemia 02/09/2022   Essential hypertension 02/09/2022   Acute kidney injury superimposed on chronic kidney disease ([place]) 02/08/2022   Hypoxia 12/01/2021   Pain in the shins 11/29/2021   Subcutaneous mass of both lower legs 11/29/2021   Bronchiectasis assoc with possible tracheomalacia 11/11/2021   Pneumonia 11/10/2021   Chronic cough 11/01/2021   UTI (urinary tract infection) 11/01/2021   Generalized weakness 10/19/2021   Sinus bradycardia 10/19/2021   Hypertensive urgency 10/19/2021   Frequent UTI 10/19/2021   Transaminitis 10/19/2021   Thrombocytopenia ([place]) 10/19/2021   Cystitis 10/10/2021   SIRS (systemic inflammatory response syndrome) ([place]) 09/01/2021   Acute respiratory failure with hypoxia ([place]) 09/01/2021   Fall at home, initial encounter 09/01/2021   Hypotension    Hypomagnesemia 08/27/2021   Normocytic anemia 08/26/2021   AKI (acute kidney injury) ([place]) 08/25/2021   MPN (myeloproliferative neoplasm) ([place]) 04/21/2021   Anxiety 04/05/2021   Leukocytosis 11/09/2020   CKD (chronic kidney disease) stage 3, GFR 30-59 ml/min (HCC) 10/29/2020   Dermatomycosis 08/02/2020   HSV-2 (herpes simplex virus 2) infection 08/02/2020   Rib pain on left side 03/21/2020   Itching 03/21/2020   Mildly underweight adult 09/19/2019   MDD (major depressive disorder), recurrent episode, moderate ([place]) 10/28/2016   Thoracic spine pain 08/31/2016   Anemia, normocytic normochromic 05/27/2015   IDA (iron deficiency anemia) 03/02/2015   Unsteady gait 02/25/2015   Mucosal abnormality of esophagus    Systemic lupus ([place]) 10/31/2012   Vitamin D deficiency 03/16/2012   Generalized  pain 03/15/2012   Constipation 10/06/2011   Hip pain 06/29/2011   Barrett's esophagus 12/13/2010   Western blot positive HSV2 06/22/2007   Depression 06/22/2007   Labile hypertension 06/22/2007   GERD without esophagitis 06/22/2007   DJD (degenerative joint disease) 06/22/2007   NECK PAIN, CHRONIC 06/22/2007   Chronic midline low back pain with sciatica 06/22/2007   Insomnia secondary to depression with anxiety 06/22/2007   Viral infection, unspecified 06/22/2007   PCP:  [name], MD Pharmacy:   [facility] [address] [place] [address] Phone: [phone] Fax: [phone]  [place], [place] [address] Phone: [phone] Fax: [phone]  [facility] [address] [place] [address] [place] [address] [place] [address] Phone: [phone] Fax: [place] [address] Phone: [phone] Fax: [phone]  [place], [place] [address] [place] [address] Phone: [phone] Fax: [phone]  [facility] [place], [place] - [address] [place] [address] Phone: [phone] Fax: [phone]     Social Determinants of Health ([place]) Social History: SDOH Screenings  Food Insecurity: No Food Insecurity (02/08/2022)  Housing: Low Risk  (02/08/2022)  Transportation Needs: No Transportation Needs (02/08/2022)  Utilities: Not At Risk (02/08/2022)  Alcohol Screen: Low Risk  (12/18/2019)  Depression (PHQ2-9): Medium Risk (01/11/2022)  Financial Resource Strain: Low Risk  (12/28/2020)  Physical Activity: Inactive (04/23/2021)  Social Connections: Moderately Integrated (12/28/2020)  Stress: Stress Concern Present (04/23/2021)  Tobacco Use:  Medium Risk (02/08/2022)   SDOH Interventions:     Readmission Risk Interventions    02/09/2022   11:30 AM 11/12/2021    1:49 PM  Readmission Risk Prevention Plan  Transportation Screening Complete Complete  PCP or Specialist Appt within 3-5 Days  Complete  HRI or [place]  Complete  Social Work Consult for [place] Planning/Counseling  Complete  Palliative Care Screening  Complete  Medication Review Press photographer) Complete Complete  HRI or Home Care Consult Complete   SW Recovery Care/Counseling Consult Complete   Palliative Care Screening Not Applicable   Skilled Nursing Facility Complete

## 2022-02-09 NOTE — Hospital Course (Signed)
77 y.o. female with medical history significant of  myeloproliferative neoplasm (follows with [name]), hypertension, GERD, chronic back pain, CKD stage III who presents to the emergency department due to generalized weakness, body aches, increased urinary frequency with burning sensation on urination.  She complained of several days of not drinking enough fluid and having poor appetite.  Patient states that she has some leftover antibiotics taken about a week ago due to concern about possible UTI which was not helpful (she does not remember the name of the antibiotics).  Patient denies chest pain, shortness of breath, nausea, vomiting or diarrhea.   ED Course:  In the emergency department, she was intermittently bradycardic and was hypotensive on arrival with BP of 62/45, other vital signs were within normal range.  Workup in the ED showed WBC of 100.7, hemoglobin 7.3, hematocrit 33.6, platelets 98.  BMP was normal except for potassium of 5.8 and BUN/creatinine of 53/3.87 (baseline creatinine ranged within 1.3-1.6), Influenza A, B, SARS coronavirus 2, RSV was negative.  Urinalysis was positive for UTI.  Chest x-ray showed no acute cardiopulmonary process.  Patient was treated with IV ceftriaxone, oxycodone 50 mg x 1 was given and Lokelma was given due to hyperkalemia.  Hospitalist was asked to admit patient for further evaluation and management.

## 2022-02-09 NOTE — Progress Notes (Signed)
PROGRESS NOTE   Renee Waters  UXN:[id number] DOB: [birth year] DOA: 02/08/2022 PCP: [name], MD   Chief Complaint  Patient presents with   Illness   Hypotension   Level of care: Med-Surg  Brief Admission History:  77 y.o. female with medical history significant of  myeloproliferative neoplasm (follows with [name]), hypertension, GERD, chronic back pain, CKD stage III who presents to the emergency department due to generalized weakness, body aches, increased urinary frequency with burning sensation on urination.  She complained of several days of not drinking enough fluid and having poor appetite.  Patient states that she has some leftover antibiotics taken about a week ago due to concern about possible UTI which was not helpful (she does not remember the name of the antibiotics).  Patient denies chest pain, shortness of breath, nausea, vomiting or diarrhea.   ED Course:  In the emergency department, she was intermittently bradycardic and was hypotensive on arrival with BP of 62/45, other vital signs were within normal range.  Workup in the ED showed WBC of 100.7, hemoglobin 7.3, hematocrit 33.6, platelets 98.  BMP was normal except for potassium of 5.8 and BUN/creatinine of 53/3.87 (baseline creatinine ranged within 1.3-1.6), Influenza A, B, SARS coronavirus 2, RSV was negative.  Urinalysis was positive for UTI.  Chest x-ray showed no acute cardiopulmonary process.  Patient was treated with IV ceftriaxone, oxycodone 50 mg x 1 was given and Lokelma was given due to hyperkalemia.  Hospitalist was asked to admit patient for further evaluation and management.   Assessment and Plan:  Acute kidney injury superimposed on CKD stage V Dehydration BUN/creatinine of 53/3.87 (baseline creatinine ranged within 1.3-1.6), Continue gentle hydration Renally adjust medications, avoid nephrotoxic agents/dehydration/hypotension Improving with hydration    Hyperkalemia - Treated and  Resolved  K+ 5.8, Lokelma was given   UTI POA Patient complained of increased urinary frequency with burning sensation on urination Urinalysis was positive for UTI Patient was started on IV ceftriaxone, we shall continue with same at this time. Urine culture pending Increased dose of ceftriaxone to 2gm    Transaminitis AST 46, ALP 154 Stable to improving Outpatient follow up    Chronic normocytic anemia Hemoglobin is down to 7.0 with IV fluid hydration Follow up with [name]   Chronic thrombocytopenia Platelets 98, stable; no active bleeding   Myeloproliferative neoplasm WBC 109.7; patient follows with [name]   Essential hypertension Continue metoprolol   GERD Continue Protonix   DVT prophylaxis: [place] heparin Code Status: daughter bedside 1/3 Family Communication:  Disposition: Status is: Inpatient Remains inpatient appropriate because: IV antibiotics    Consultants:   Procedures:   Antimicrobials:  Ceftriaxone 1/2>>  Subjective: Pt reports urine still has foul smell, starting to feel a little better today;  Objective: Vitals:   02/09/22 0152 02/09/22 0418 02/09/22 0758 02/09/22 0840  BP:  128/63 (!) 147/69 (!) 151/72  Pulse:  83 77 81  Resp:  16 12   Temp:  99.4 F (37.4 C) 99.1 F (37.3 C)   TempSrc:  Oral Oral   SpO2:  94% 100%   Weight: 59.6 kg     Height: '5\' 5"'$  (1.651 m)       Intake/Output Summary (Last 24 hours) at 02/09/2022 1150 Last data filed at 02/09/2022 0900 Gross per 24 hour  Intake 352.34 ml  Output 250 ml  Net 102.34 ml   Filed Weights   02/09/22 0152  Weight: 59.6 kg   Examination:  General exam: Appears  calm and comfortable  Respiratory system: Clear to auscultation. Respiratory effort normal. Cardiovascular system: normal S1 & S2 heard. No JVD, murmurs, rubs, gallops or clicks. No pedal edema. Gastrointestinal system: Abdomen is nondistended, soft and nontender. No organomegaly or masses felt. Normal bowel sounds  heard. Central nervous system: Alert and oriented. No focal neurological deficits. Extremities: Symmetric 5 x 5 power. Skin: No rashes, lesions or ulcers. Psychiatry: Judgement and insight appear normal. Mood & affect appropriate.   Data Reviewed: I have personally reviewed following labs and imaging studies  CBC: Recent Labs  Lab 02/08/22 1940 02/09/22 0444  WBC 109.7* 101.3*  NEUTROABS 75.7*  --   HGB 7.3* 7.0*  HCT 23.6* 22.6*  MCV 95.9 95.8  PLT 98* 99*    Basic Metabolic Panel: Recent Labs  Lab 02/08/22 1940 02/09/22 0444  NA 135 138  K 5.8* 3.7  CL 108 107  CO2 22 23  GLUCOSE 97 109*  BUN 53* 48*  CREATININE 3.87* 3.00*  CALCIUM 8.1* 8.2*  MG  --  1.9  PHOS  --  3.8    CBG: No results for input(s): "GLUCAP" in the last 168 hours.  Recent Results (from the past 240 hour(s))  Resp panel by RT-PCR (RSV, Flu A&B, Covid) Anterior Nasal Swab     Status: None   Collection Time: 02/08/22  7:40 PM   Specimen: Anterior Nasal Swab  Result Value Ref Range Status   SARS Coronavirus 2 by RT PCR NEGATIVE NEGATIVE Final    Comment: (NOTE) SARS-CoV-2 target nucleic acids are NOT DETECTED.  The SARS-CoV-2 RNA is generally detectable in upper respiratory specimens during the acute phase of infection. The lowest concentration of SARS-CoV-2 viral copies this assay can detect is 138 copies/mL. A negative result does not preclude SARS-Cov-2 infection and should not be used as the sole basis for treatment or other patient management decisions. A negative result may occur with  improper specimen collection/handling, submission of specimen other than nasopharyngeal swab, presence of viral mutation(s) within the areas targeted by this assay, and inadequate number of viral copies(<138 copies/mL). A negative result must be combined with clinical observations, patient history, and epidemiological information. The expected result is Negative.  Fact Sheet for Patients:   EntrepreneurPulse.com.au  Fact Sheet for Healthcare Providers:  IncredibleEmployment.be  This test is no t yet approved or cleared by the [place] FDA and  has been authorized for detection and/or diagnosis of SARS-CoV-2 by FDA under an Emergency Use Authorization (EUA). This EUA will remain  in effect (meaning this test can be used) for the duration of the COVID-19 declaration under Section 564(b)(1) of the Act, 21 U.S.C.section 360bbb-3(b)(1), unless the authorization is terminated  or revoked sooner.       Influenza A by PCR NEGATIVE NEGATIVE Final   Influenza B by PCR NEGATIVE NEGATIVE Final    Comment: (NOTE) The Xpert Xpress SARS-CoV-2/FLU/RSV plus assay is intended as an aid in the diagnosis of influenza from Nasopharyngeal swab specimens and should not be used as a sole basis for treatment. Nasal washings and aspirates are unacceptable for Xpert Xpress SARS-CoV-2/FLU/RSV testing.  Fact Sheet for Patients: EntrepreneurPulse.com.au  Fact Sheet for Healthcare Providers: IncredibleEmployment.be  This test is not yet approved or cleared by the [place] FDA and has been authorized for detection and/or diagnosis of SARS-CoV-2 by FDA under an Emergency Use Authorization (EUA). This EUA will remain in effect (meaning this test can be used) for the duration of the COVID-19 declaration under  Section 564(b)(1) of the Act, 21 U.S.C. section 360bbb-3(b)(1), unless the authorization is terminated or revoked.     Resp Syncytial Virus by PCR NEGATIVE NEGATIVE Final    Comment: (NOTE) Fact Sheet for Patients: EntrepreneurPulse.com.au  Fact Sheet for Healthcare Providers: IncredibleEmployment.be  This test is not yet approved or cleared by the [place] FDA and has been authorized for detection and/or diagnosis of SARS-CoV-2 by FDA under an Emergency Use  Authorization (EUA). This EUA will remain in effect (meaning this test can be used) for the duration of the COVID-19 declaration under Section 564(b)(1) of the Act, 21 U.S.C. section 360bbb-3(b)(1), unless the authorization is terminated or revoked.  Performed at [facility], [address] [place], [place] [address]   Culture, blood (Routine x 2)     Status: None (Preliminary result)   Collection Time: 02/08/22  7:40 PM   Specimen: Porta Cath; Blood  Result Value Ref Range Status   Specimen Description PORTA CATH BOTTLES DRAWN AEROBIC AND ANAEROBIC  Final   Special Requests   Final    Blood Culture adequate volume Performed at [facility], [address], [place], [place] [address]    Culture PENDING  Incomplete   Report Status PENDING  Incomplete  Culture, blood (Routine x 2)     Status: None (Preliminary result)   Collection Time: 02/09/22  4:44 AM   Specimen: Porta Cath; Blood  Result Value Ref Range Status   Specimen Description PORTA CATH  Final   Special Requests   Final    BOTTLES DRAWN AEROBIC AND ANAEROBIC Blood Culture adequate volume Performed at [facility], [address], [place], [place] [address]    Culture PENDING  Incomplete   Report Status PENDING  Incomplete     Radiology Studies: DG Chest Port 1 View  Result Date: 02/08/2022 CLINICAL DATA:  Assesses suspected EXAM: PORTABLE CHEST 1 VIEW COMPARISON:  11/10/2021 FINDINGS: Right chest wall port with catheter tip overlying the SVC. Cardiac and mediastinal contours are within normal limits. No focal pulmonary opacity. No pleural effusion or pneumothorax. No acute osseous abnormality. IMPRESSION: No acute cardiopulmonary process. Electronically Signed   By: Merilyn Baba M.D.   On: 02/08/2022 18:25    Scheduled Meds:  allopurinol  300 mg Oral Daily   Chlorhexidine Gluconate Cloth  6 each Topical Daily   citalopram  20 mg Oral Daily   heparin  5,000 Units Subcutaneous Q8H   loratadine  10 mg Oral Daily    metoprolol tartrate  25 mg Oral BID   mirtazapine  15 mg Oral QHS   Momelotinib Dihydrochloride  100 mg Oral Daily   pantoprazole  40 mg Oral BID AC   potassium chloride  10 mEq Oral Daily   Continuous Infusions:  cefTRIAXone (ROCEPHIN)  IV     lactated ringers 70 mL/hr at 02/09/22 0839     LOS: 1 day   Time spent: 21 mins  Emanual Lamountain Wynetta Emery, MD How to contact the [facility] Attending or Consulting provider [place] or covering provider during after hours [place], for this patient?  Check the care team in [facility] and look for a) attending/consulting TRH provider listed and b) the [facility] team listed Log into [email or web address] and use [place]'s universal password to access. If you do not have the password, please contact the hospital operator. Locate the [facility] provider you are looking for under [facility] and page to a number that you can be directly reached. If you still have difficulty  reaching the provider, please page the [facility] (Director on Call) for the Hospitalists listed on amion for assistance.  02/09/2022, 11:50 AM

## 2022-02-10 ENCOUNTER — Ambulatory Visit: Payer: Medicare Other | Admitting: Hematology

## 2022-02-10 ENCOUNTER — Other Ambulatory Visit: Payer: Self-pay

## 2022-02-10 ENCOUNTER — Other Ambulatory Visit: Payer: Medicare Other

## 2022-02-10 DIAGNOSIS — D471 Chronic myeloproliferative disease: Secondary | ICD-10-CM

## 2022-02-10 DIAGNOSIS — D696 Thrombocytopenia, unspecified: Secondary | ICD-10-CM | POA: Diagnosis not present

## 2022-02-10 DIAGNOSIS — N179 Acute kidney failure, unspecified: Secondary | ICD-10-CM | POA: Diagnosis not present

## 2022-02-10 DIAGNOSIS — E875 Hyperkalemia: Secondary | ICD-10-CM | POA: Diagnosis not present

## 2022-02-10 DIAGNOSIS — R7401 Elevation of levels of liver transaminase levels: Secondary | ICD-10-CM | POA: Diagnosis not present

## 2022-02-10 LAB — URINE CULTURE

## 2022-02-10 MED ORDER — BUSPIRONE HCL 5 MG PO TABS: 5.0000 mg | ORAL_TABLET | Freq: Every day | ORAL | Status: DC | PRN

## 2022-02-10 MED ORDER — ACYCLOVIR 800 MG PO TABS: 800.0000 mg | ORAL_TABLET | Freq: Every day | ORAL | 1 refills | Status: DC | PRN

## 2022-02-10 MED ORDER — ALLOPURINOL 100 MG PO TABS
100.0000 mg | ORAL_TABLET | Freq: Every day | ORAL | Status: DC
  Administered 2022-02-10: 100 mg via ORAL
  Filled 2022-02-10: qty 1

## 2022-02-10 MED ORDER — ALLOPURINOL 100 MG PO TABS: 200.0000 mg | ORAL_TABLET | Freq: Every day | ORAL | Status: DC

## 2022-02-10 MED ORDER — ALLOPURINOL 200 MG PO TABS: 200.0000 mg | ORAL_TABLET | Freq: Every day | ORAL | 0 refills | Status: DC

## 2022-02-10 MED ORDER — CEFDINIR 300 MG PO CAPS: 300.0000 mg | ORAL_CAPSULE | Freq: Every day | ORAL | 0 refills | Status: AC

## 2022-02-10 MED ORDER — ACYCLOVIR 5 % EX OINT: 1.0000 | TOPICAL_OINTMENT | CUTANEOUS | 1 refills | Status: DC | PRN

## 2022-02-10 MED ORDER — HEPARIN SOD (PORK) LOCK FLUSH 100 UNIT/ML IV SOLN
500.0000 [IU] | INTRAVENOUS | Status: DC | PRN
  Filled 2022-02-10: qty 5

## 2022-02-10 NOTE — Plan of Care (Signed)

## 2022-02-10 NOTE — Discharge Instructions (Signed)
IMPORTANT INFORMATION: PAY CLOSE ATTENTION   PHYSICIAN DISCHARGE INSTRUCTIONS  Follow with Primary care provider  Simpson, Margaret E, MD  and other consultants as instructed by your Hospitalist Physician  SEEK MEDICAL CARE OR RETURN TO EMERGENCY ROOM IF SYMPTOMS COME BACK, WORSEN OR NEW PROBLEM DEVELOPS   Please note: You were cared for by a hospitalist during your hospital stay. Every effort will be made to forward records to your primary care provider.  You can request that your primary care provider send for your hospital records if they have not received them.  Once you are discharged, your primary care physician will handle any further medical issues. Please note that NO REFILLS for any discharge medications will be authorized once you are discharged, as it is imperative that you return to your primary care physician (or establish a relationship with a primary care physician if you do not have one) for your post hospital discharge needs so that they can reassess your need for medications and monitor your lab values.  Please get a complete blood count and chemistry panel checked by your Primary MD at your next visit, and again as instructed by your Primary MD.  Get Medicines reviewed and adjusted: Please take all your medications with you for your next visit with your Primary MD  Laboratory/radiological data: Please request your Primary MD to go over all hospital tests and procedure/radiological results at the follow up, please ask your primary care provider to get all Hospital records sent to his/her office.  In some cases, they will be blood work, cultures and biopsy results pending at the time of your discharge. Please request that your primary care provider follow up on these results.  If you are diabetic, please bring your blood sugar readings with you to your follow up appointment with primary care.    Please call and make your follow up appointments as soon as possible.    Also  Note the following: If you experience worsening of your admission symptoms, develop shortness of breath, life threatening emergency, suicidal or homicidal thoughts you must seek medical attention immediately by calling 911 or calling your MD immediately  if symptoms less severe.  You must read complete instructions/literature along with all the possible adverse reactions/side effects for all the Medicines you take and that have been prescribed to you. Take any new Medicines after you have completely understood and accpet all the possible adverse reactions/side effects.   Do not drive when taking Pain medications or sleeping medications (Benzodiazepines)  Do not take more than prescribed Pain, Sleep and Anxiety Medications. It is not advisable to combine anxiety,sleep and pain medications without talking with your primary care practitioner  Special Instructions: If you have smoked or chewed Tobacco  in the last 2 yrs please stop smoking, stop any regular Alcohol  and or any Recreational drug use.  Wear Seat belts while driving.  Do not drive if taking any narcotic, mind altering or controlled substances or recreational drugs or alcohol.       

## 2022-02-10 NOTE — TOC Transition Note (Signed)
Transition of Care [facility]) - CM/SW Discharge Note   Patient Details  Name: Renee Waters MRN: [id number] Date of Birth: [birth year]  [facility] CM/SW Contact:  Boneta Lucks, RN Phone Number: 02/10/2022, 12:53 PM   Clinical Narrative:   Discharging home with home health. Caryl Pina with [facility] updated, order placed and added to AVS.   Final next level of care: [place] Barriers to Discharge: Barriers Resolved   Patient Goals and CMS Choice CMS Medicare.gov Compare Post Acute Care list provided to:: Patient Choice offered to / list presented to : Patient  Discharge Placement        Patient and family notified of of transfer: 02/10/22  Discharge Plan and Services Additional resources added to the After Visit Summary for   In-house Referral: Clinical Social Work Discharge Planning Services: CM Consult Post Acute Care Choice: Home Health                 HH Arranged: PT, RN, Nurse's Aide [place] Agency: [place] ([place]) Date HH Agency Contacted: 02/09/22   Representative spoke with at [place]: [place] ([place]) Interventions [place]: No Food Insecurity (02/08/2022)  Housing: Low Risk  (02/08/2022)  Transportation Needs: No Transportation Needs (02/08/2022)  Utilities: Not At Risk (02/08/2022)  Alcohol Screen: Low Risk  (12/18/2019)  Depression (PHQ2-9): Medium Risk (01/11/2022)  Financial Resource Strain: Low Risk  (12/28/2020)  Physical Activity: Inactive (04/23/2021)  Social Connections: Moderately Integrated (12/28/2020)  Stress: Stress Concern Present (04/23/2021)  Tobacco Use: Medium Risk (02/08/2022)     Readmission Risk Interventions    02/09/2022   11:30 AM 11/12/2021    1:49 PM  Readmission Risk Prevention Plan  Transportation Screening Complete Complete  PCP or Specialist Appt within 3-5 Days  Complete  HRI or [place]  Complete  Social Work Consult for  [place] Planning/Counseling  Complete  Palliative Care Screening  Complete  Medication Review Press photographer) Complete Complete  HRI or Home Care Consult Complete   SW Recovery Care/Counseling Consult Complete   Palliative Care Screening Not Applicable   Skilled Nursing Facility Complete

## 2022-02-10 NOTE — Consult Note (Signed)
   [facility] CM Inpatient Consult   02/10/2022  Renee Waters 1945/06/10 [id number]  [facility] [employer] [ACO] Patient:Medicare ACO REACH  *[facility] Liaison remote coverage review for patient admitted to [facility] patient for post hospital support needs, however, no answer but able to leave a HIPAA appropriate message requesting a return call with contact information    Primary Care Provider: [name], MD    Patient screened for 5 hospitalization s in the past 6 months with noted high risk score for unplanned readmission risk and to assess for potential [place] Management service needs for post hospital transition for care coordination.  Review of patient's electronic medical record reveals patient is for home with [facility].   Plan:   Referral request for community care coordination: Referral for offer of follow up due to extreme high risk score for readmission prevention  Of note, [facility] Care Management/Population Health does not replace or interfere with any arrangements made by the Inpatient Transition of Care team.  For questions contact:   Natividad Brood, RN BSN [place]  [phone] business mobile phone Toll free office [phone]  *[place]  [phone] Fax number: [phone] [email or web address]

## 2022-02-10 NOTE — Progress Notes (Signed)
Order placed for [facility] Nutrition Referral

## 2022-02-10 NOTE — Care Management Important Message (Signed)
Important Message  Patient Details  Name: Renee Waters MRN: [id number] Date of Birth: Mar 24, 1945   Medicare Important Message Given:  N/A - LOS <3 / Initial given by admissions     Tommy Medal 02/10/2022, 10:01 AM

## 2022-02-10 NOTE — Discharge Summary (Signed)
Physician Discharge Summary  Renee Waters EXN:[id number] DOB: 27-Oct-1945 DOA: 02/08/2022  PCP: Renee Helper, MD Heme/onc: Renee Waters  Admit date: 02/08/2022 Discharge date: 02/10/2022  Admitted From: Home  Disposition: Home   Recommendations for Outpatient Follow-up:  Follow up with PCP in 1 weeks Please recheck blood pressure and address if remains elevated Please recheck liver enzymes on outpatient follow up  Please check CBC in 1 week  Please follow up with Dr. Delton Waters in 1-2 weeks  Discharge Condition: STABLE   CODE STATUS: FULL DIET: 2 gm sodium restricted   Brief Hospitalization Summary: Please see all hospital notes, images, labs for full details of the hospitalization. ADMISSION HPI:  77 y.o. female with medical history significant of  myeloproliferative neoplasm (follows with Dr. Delton Waters), hypertension, GERD, chronic back pain, CKD stage III who presents to the emergency department due to generalized weakness, body aches, increased urinary frequency with burning sensation on urination.  She complained of several days of not drinking enough fluid and having poor appetite.  Patient states that she has some leftover antibiotics taken about a week ago due to concern about possible UTI which was not helpful (she does not remember the name of the antibiotics).  Patient denies chest pain, shortness of breath, nausea, vomiting or diarrhea.   ED Course:  In the emergency department, she was intermittently bradycardic and was hypotensive on arrival with BP of 62/45, other vital signs were within normal range.  Workup in the ED showed WBC of 100.7, hemoglobin 7.3, hematocrit 33.6, platelets 98.  BMP was normal except for potassium of 5.8 and BUN/creatinine of 53/3.87 (baseline creatinine ranged within 1.3-1.6), Influenza A, B, SARS coronavirus 2, RSV was negative.  Urinalysis was positive for UTI.  Chest x-ray showed no acute cardiopulmonary process.  Patient was treated with IV  ceftriaxone, oxycodone 50 mg x 1 was given and Lokelma was given due to hyperkalemia.  Hospitalist was asked to admit patient for further evaluation and management.  HOSPITAL COURSE BY PROBLEM   Acute kidney injury superimposed on CKD stage V Dehydration BUN/creatinine of 53/3.87 (baseline creatinine ranged within 1.3-1.6), Continue gentle hydration Renally adjust medications, avoid nephrotoxic agents/dehydration/hypotension Improved with hydration    Hyperkalemia - Treated and Resolved  K+ 5.8, Lokelma was given   UTI POA Patient complained of increased urinary frequency with burning sensation on urination Urinalysis was positive for UTI Patient was started on IV ceftriaxone Urine culture indeterminate Increased dose of ceftriaxone to 2gm  DC home on oral cefdinir to complete treatment   Transaminitis AST 46, ALP 154 Stable to improving Outpatient follow up    Chronic normocytic anemia Hemoglobin is down to 7.0 with IV fluid hydration Follow up with Dr. Delton Waters   Chronic thrombocytopenia Platelets 98, stable; no active bleeding Please recheck CBC in 1 week; follow up with heme/onc    Myeloproliferative neoplasm WBC 109.7; patient follows with Dr. Delton Waters   Essential hypertension - poorly controlled Continue metoprolol Recheck BP with PCP in 1 week for further adjustments    GERD Continue Protonix    Discharge Diagnoses:  Principal Problem:   Acute kidney injury superimposed on chronic kidney disease ([place]) Active Problems:   GERD without esophagitis   MPN (myeloproliferative neoplasm) (HCC)   Normocytic anemia   Transaminitis   Thrombocytopenia (HCC)   UTI (urinary tract infection)   Hyperkalemia   Essential hypertension   Discharge Instructions: Discharge Instructions     Ambulatory referral to Hematology / Oncology   Complete by: As directed  Allergies as of 02/10/2022       Reactions   Bee Venom Swelling, Hives   Norvasc [amlodipine]  Other (See Comments)   Hair loss   Tylenol [acetaminophen] Itching   Hydralazine Other (See Comments)   Hair loss   Red Dye Itching   Percocet [oxycodone-acetaminophen] Rash, Other (See Comments)   Pt states, "this gives her a rash, but at home she takes oxycodone for pain relief"   Tyloxapol Itching, Rash        Medication List     TAKE these medications    acyclovir 800 MG tablet Commonly known as: ZOVIRAX Take 1 tablet (800 mg total) by mouth 5 (five) times daily as needed. What changed:  when to take this reasons to take this   acyclovir ointment 5 % Commonly known as: ZOVIRAX Apply 1 Application topically every 3 (three) hours as needed. What changed:  when to take this reasons to take this   Allopurinol 200 MG Tabs Take 200 mg by mouth daily. What changed:  medication strength how much to take   busPIRone 5 MG tablet Commonly known as: BUSPAR Take 1 tablet (5 mg total) by mouth daily as needed (anxiety). Take one tablet by mouth once daily, as needed, for anxiety   cefdinir 300 MG capsule Commonly known as: OMNICEF Take 1 capsule (300 mg total) by mouth daily for 3 days.   cetirizine 10 MG tablet Commonly known as: ZYRTEC Take 10 mg by mouth daily.   citalopram 20 MG tablet Commonly known as: CELEXA Take 1 tablet by mouth once daily   EPINEPHrine 0.3 mg/0.3 mL Soaj injection Commonly known as: EPI-PEN INJECT 0.3 MLS INTO MUSCLE ONCE AS NEEDED   furosemide 20 MG tablet Commonly known as: LASIX TAKE 1 TABLET BY MOUTH ONCE DAILY AS NEEDED   hydrOXYzine 25 MG tablet Commonly known as: ATARAX Take 12.5-25 mg by mouth 2 (two) times daily as needed.   lidocaine-prilocaine cream Commonly known as: EMLA Apply 1 Application topically as needed (Access to port and pain).   metoprolol tartrate 25 MG tablet Commonly known as: LOPRESSOR Take one and a half tablets twice daily   mirtazapine 15 MG tablet Commonly known as: REMERON TAKE 1 TABLET BY  MOUTH AT BEDTIME   multivitamin with minerals Tabs tablet Take 1 tablet by mouth daily.   Ojjaara 100 MG Tabs Generic drug: Momelotinib Dihydrochloride Take 100 mg by mouth daily.   oxyCODONE 15 MG immediate release tablet Commonly known as: ROXICODONE Take 15 mg by mouth every 6 (six) hours as needed.   pantoprazole 40 MG tablet Commonly known as: Protonix Take 1 tablet (40 mg total) by mouth 2 (two) times daily before a meal.   potassium chloride 10 MEQ tablet Commonly known as: KLOR-CON Take 1 tablet by mouth once daily   prochlorperazine 10 MG tablet Commonly known as: COMPAZINE Take 1 tablet (10 mg total) by mouth every 6 (six) hours as needed for nausea or vomiting.   temazepam 30 MG capsule Commonly known as: Restoril Take 1 capsule (30 mg total) by mouth at bedtime as needed for sleep.   tiZANidine 4 MG tablet Commonly known as: ZANAFLEX Take by mouth.        Follow-up Information     Renee Helper, MD. Schedule an appointment as soon as possible for a visit in 1 week(s).   Specialty: Family Medicine Why: Hospital Follow Up Contact information: [address] [place] [address] [phone]  Derek Jack, MD. Schedule an appointment as soon as possible for a visit in 1 week(s).   Specialty: Hematology Why: Hospital Follow Up Contact information: [place] [address] [phone]                Allergies  Allergen Reactions   Bee Venom Swelling and Hives   Norvasc [Amlodipine] Other (See Comments)    Hair loss   Tylenol [Acetaminophen] Itching   Hydralazine Other (See Comments)    Hair loss   Red Dye Itching   Percocet [Oxycodone-Acetaminophen] Rash and Other (See Comments)    Pt states, "this gives her a rash, but at home she takes oxycodone for pain relief"    Tyloxapol Itching and Rash   Allergies as of 02/10/2022       Reactions   Bee Venom Swelling, Hives   Norvasc  [amlodipine] Other (See Comments)   Hair loss   Tylenol [acetaminophen] Itching   Hydralazine Other (See Comments)   Hair loss   Red Dye Itching   Percocet [oxycodone-acetaminophen] Rash, Other (See Comments)   Pt states, "this gives her a rash, but at home she takes oxycodone for pain relief"   Tyloxapol Itching, Rash        Medication List     TAKE these medications    acyclovir 800 MG tablet Commonly known as: ZOVIRAX Take 1 tablet (800 mg total) by mouth 5 (five) times daily as needed. What changed:  when to take this reasons to take this   acyclovir ointment 5 % Commonly known as: ZOVIRAX Apply 1 Application topically every 3 (three) hours as needed. What changed:  when to take this reasons to take this   Allopurinol 200 MG Tabs Take 200 mg by mouth daily. What changed:  medication strength how much to take   busPIRone 5 MG tablet Commonly known as: BUSPAR Take 1 tablet (5 mg total) by mouth daily as needed (anxiety). Take one tablet by mouth once daily, as needed, for anxiety   cefdinir 300 MG capsule Commonly known as: OMNICEF Take 1 capsule (300 mg total) by mouth daily for 3 days.   cetirizine 10 MG tablet Commonly known as: ZYRTEC Take 10 mg by mouth daily.   citalopram 20 MG tablet Commonly known as: CELEXA Take 1 tablet by mouth once daily   EPINEPHrine 0.3 mg/0.3 mL Soaj injection Commonly known as: EPI-PEN INJECT 0.3 MLS INTO MUSCLE ONCE AS NEEDED   furosemide 20 MG tablet Commonly known as: LASIX TAKE 1 TABLET BY MOUTH ONCE DAILY AS NEEDED   hydrOXYzine 25 MG tablet Commonly known as: ATARAX Take 12.5-25 mg by mouth 2 (two) times daily as needed.   lidocaine-prilocaine cream Commonly known as: EMLA Apply 1 Application topically as needed (Access to port and pain).   metoprolol tartrate 25 MG tablet Commonly known as: LOPRESSOR Take one and a half tablets twice daily   mirtazapine 15 MG tablet Commonly known as: REMERON TAKE 1  TABLET BY MOUTH AT BEDTIME   multivitamin with minerals Tabs tablet Take 1 tablet by mouth daily.   Ojjaara 100 MG Tabs Generic drug: Momelotinib Dihydrochloride Take 100 mg by mouth daily.   oxyCODONE 15 MG immediate release tablet Commonly known as: ROXICODONE Take 15 mg by mouth every 6 (six) hours as needed.   pantoprazole 40 MG tablet Commonly known as: Protonix Take 1 tablet (40 mg total) by mouth 2 (two) times daily before a meal.   potassium chloride 10  MEQ tablet Commonly known as: KLOR-CON Take 1 tablet by mouth once daily   prochlorperazine 10 MG tablet Commonly known as: COMPAZINE Take 1 tablet (10 mg total) by mouth every 6 (six) hours as needed for nausea or vomiting.   temazepam 30 MG capsule Commonly known as: Restoril Take 1 capsule (30 mg total) by mouth at bedtime as needed for sleep.   tiZANidine 4 MG tablet Commonly known as: ZANAFLEX Take by mouth.        Procedures/Studies: DG Chest Port 1 View  Result Date: 02/08/2022 CLINICAL DATA:  Assesses suspected EXAM: PORTABLE CHEST 1 VIEW COMPARISON:  11/10/2021 FINDINGS: Right chest wall port with catheter tip overlying the SVC. Cardiac and mediastinal contours are within normal limits. No focal pulmonary opacity. No pleural effusion or pneumothorax. No acute osseous abnormality. IMPRESSION: No acute cardiopulmonary process. Electronically Signed   By: Merilyn Baba M.D.   On: 02/08/2022 18:25     Subjective: Pt reports feeling much better today.  No specific complaints.    Discharge Exam: Vitals:   02/09/22 2156 02/10/22 0549  BP: (!) 185/79 (!) 165/88  Pulse: 75 93  Resp: 20 18  Temp: 99 F (37.2 C) 98.3 F (36.8 C)  SpO2: 94% 94%   Vitals:   02/09/22 1200 02/09/22 1335 02/09/22 2156 02/10/22 0549  BP: (!) 155/77 (!) 167/69 (!) 185/79 (!) 165/88  Pulse: 69 70 75 93  Resp: '16 16 20 18  '$ Temp: 98.7 F (37.1 C) 98.8 F (37.1 C) 99 F (37.2 C) 98.3 F (36.8 C)  TempSrc: Oral Oral Oral    SpO2: 96% 97% 94% 94%  Weight:      Height:       General: Pt is alert, awake, not in acute distress Cardiovascular: normal S1/S2 +, no rubs, no gallops Respiratory: CTA bilaterally, no wheezing, no rhonchi Abdominal: Soft, NT, ND, bowel sounds + Extremities: no edema, no cyanosis   The results of significant diagnostics from this hospitalization (including imaging, microbiology, ancillary and laboratory) are listed below for reference.     Microbiology: Recent Results (from the past 240 hour(s))  Resp panel by RT-PCR (RSV, Flu A&B, Covid) Anterior Nasal Swab     Status: None   Collection Time: 02/08/22  7:40 PM   Specimen: Anterior Nasal Swab  Result Value Ref Range Status   SARS Coronavirus 2 by RT PCR NEGATIVE NEGATIVE Final    Comment: (NOTE) SARS-CoV-2 target nucleic acids are NOT DETECTED.  The SARS-CoV-2 RNA is generally detectable in upper respiratory specimens during the acute phase of infection. The lowest concentration of SARS-CoV-2 viral copies this assay can detect is 138 copies/mL. A negative result does not preclude SARS-Cov-2 infection and should not be used as the sole basis for treatment or other patient management decisions. A negative result may occur with  improper specimen collection/handling, submission of specimen other than nasopharyngeal swab, presence of viral mutation(s) within the areas targeted by this assay, and inadequate number of viral copies(<138 copies/mL). A negative result must be combined with clinical observations, patient history, and epidemiological information. The expected result is Negative.  Fact Sheet for Patients:  EntrepreneurPulse.com.au  Fact Sheet for Healthcare Providers:  IncredibleEmployment.be  This test is no t yet approved or cleared by the [place] FDA and  has been authorized for detection and/or diagnosis of SARS-CoV-2 by FDA under an Emergency Use Authorization (EUA).  This EUA will remain  in effect (meaning this test can be used) for the duration of  the COVID-19 declaration under Section 564(b)(1) of the Act, 21 U.S.C.section 360bbb-3(b)(1), unless the authorization is terminated  or revoked sooner.       Influenza A by PCR NEGATIVE NEGATIVE Final   Influenza B by PCR NEGATIVE NEGATIVE Final    Comment: (NOTE) The Xpert Xpress SARS-CoV-2/FLU/RSV plus assay is intended as an aid in the diagnosis of influenza from Nasopharyngeal swab specimens and should not be used as a sole basis for treatment. Nasal washings and aspirates are unacceptable for Xpert Xpress SARS-CoV-2/FLU/RSV testing.  Fact Sheet for Patients: EntrepreneurPulse.com.au  Fact Sheet for Healthcare Providers: IncredibleEmployment.be  This test is not yet approved or cleared by the [place] FDA and has been authorized for detection and/or diagnosis of SARS-CoV-2 by FDA under an Emergency Use Authorization (EUA). This EUA will remain in effect (meaning this test can be used) for the duration of the COVID-19 declaration under Section 564(b)(1) of the Act, 21 U.S.C. section 360bbb-3(b)(1), unless the authorization is terminated or revoked.     Resp Syncytial Virus by PCR NEGATIVE NEGATIVE Final    Comment: (NOTE) Fact Sheet for Patients: EntrepreneurPulse.com.au  Fact Sheet for Healthcare Providers: IncredibleEmployment.be  This test is not yet approved or cleared by the [place] FDA and has been authorized for detection and/or diagnosis of SARS-CoV-2 by FDA under an Emergency Use Authorization (EUA). This EUA will remain in effect (meaning this test can be used) for the duration of the COVID-19 declaration under Section 564(b)(1) of the Act, 21 U.S.C. section 360bbb-3(b)(1), unless the authorization is terminated or revoked.  Performed at [facility], [address] [place], [place]  [address]   Culture, blood (Routine x 2)     Status: None (Preliminary result)   Collection Time: 02/08/22  7:40 PM   Specimen: Porta Cath; Blood  Result Value Ref Range Status   Specimen Description   Final    PORTA CATH BOTTLES DRAWN AEROBIC AND ANAEROBIC Performed at [facility], [address] [place], [place] [address]    Special Requests   Final    Blood Culture adequate volume Performed at [facility], [address] [place], [place] [address]    Culture   Final    NO GROWTH < 24 HOURS Performed at [facility], [place] [address], [place], [place] [address]    Report Status PENDING  Incomplete  Urine Culture     Status: Abnormal   Collection Time: 02/09/22  1:40 AM   Specimen: Urine, Clean Catch  Result Value Ref Range Status   Specimen Description   Final    URINE, CLEAN CATCH Performed at [facility], [address], [place], [place] [address]    Special Requests   Final    NONE Performed at [facility], [address] [place], [place] [address]    Culture MULTIPLE SPECIES PRESENT, SUGGEST RECOLLECTION (A)  Final   Report Status 02/10/2022 FINAL  Final  Culture, blood (Routine x 2)     Status: None (Preliminary result)   Collection Time: 02/09/22  4:44 AM   Specimen: Porta Cath; Blood  Result Value Ref Range Status   Specimen Description PORTA CATH  Final   Special Requests   Final    BOTTLES DRAWN AEROBIC AND ANAEROBIC Blood Culture adequate volume Performed at [facility], [address], [place], [place] [address]    Culture PENDING  Incomplete   Report Status PENDING  Incomplete     Labs: BNP (last 3 results) Recent Labs  09/02/21 1622 11/10/21 1830  BNP 569.0* 245.8*   Basic Metabolic Panel: Recent Labs  Lab 02/08/22 1940 02/09/22 0444  NA 135 138  K 5.8* 3.7  CL 108 107  CO2 22 23  GLUCOSE 97 109*  BUN 53* 48*  CREATININE 3.87* 3.00*  CALCIUM 8.1* 8.2*  MG  --  1.9  PHOS  --  3.8   Liver Function Tests: Recent Labs  Lab  02/08/22 1940 02/09/22 0444  AST 46* 41  ALT 21 19  ALKPHOS 154* 137*  BILITOT 1.0 0.8  PROT 7.4 6.6  ALBUMIN 3.5 3.0*   No results for input(s): "LIPASE", "AMYLASE" in the last 168 hours. No results for input(s): "AMMONIA" in the last 168 hours. CBC: Recent Labs  Lab 02/08/22 1940 02/09/22 0444  WBC 109.7* 101.3*  NEUTROABS 75.7*  --   HGB 7.3* 7.0*  HCT 23.6* 22.6*  MCV 95.9 95.8  PLT 98* 99*   Cardiac Enzymes: No results for input(s): "CKTOTAL", "CKMB", "CKMBINDEX", "TROPONINI" in the last 168 hours. BNP: Invalid input(s): "POCBNP" CBG: No results for input(s): "GLUCAP" in the last 168 hours. D-Dimer No results for input(s): "DDIMER" in the last 72 hours. Hgb A1c No results for input(s): "HGBA1C" in the last 72 hours. Lipid Profile No results for input(s): "CHOL", "HDL", "LDLCALC", "TRIG", "CHOLHDL", "LDLDIRECT" in the last 72 hours. Thyroid function studies No results for input(s): "TSH", "T4TOTAL", "T3FREE", "THYROIDAB" in the last 72 hours.  Invalid input(s): "FREET3" Anemia work up No results for input(s): "VITAMINB12", "FOLATE", "FERRITIN", "TIBC", "IRON", "RETICCTPCT" in the last 72 hours. Urinalysis    Component Value Date/Time   COLORURINE AMBER (A) 02/08/2022 2211   APPEARANCEUR CLOUDY (A) 02/08/2022 2211   LABSPEC 1.009 02/08/2022 2211   PHURINE 6.0 02/08/2022 2211   GLUCOSEU NEGATIVE 02/08/2022 2211   GLUCOSEU NEG mg/dL 05/26/2009 2225   HGBUR SMALL (A) 02/08/2022 2211   HGBUR negative 08/19/2008 1124   BILIRUBINUR NEGATIVE 02/08/2022 2211   BILIRUBINUR negative 10/08/2021 1134   BILIRUBINUR neg 08/15/2017 0955   KETONESUR NEGATIVE 02/08/2022 2211   PROTEINUR >=300 (A) 02/08/2022 2211   UROBILINOGEN 0.2 10/08/2021 1134   UROBILINOGEN 0.2 12/05/2012 1150   NITRITE POSITIVE (A) 02/08/2022 2211   LEUKOCYTESUR LARGE (A) 02/08/2022 2211   Sepsis Labs Recent Labs  Lab 02/08/22 1940 02/09/22 0444  WBC 109.7* 101.3*   Microbiology Recent  Results (from the past 240 hour(s))  Resp panel by RT-PCR (RSV, Flu A&B, Covid) Anterior Nasal Swab     Status: None   Collection Time: 02/08/22  7:40 PM   Specimen: Anterior Nasal Swab  Result Value Ref Range Status   SARS Coronavirus 2 by RT PCR NEGATIVE NEGATIVE Final    Comment: (NOTE) SARS-CoV-2 target nucleic acids are NOT DETECTED.  The SARS-CoV-2 RNA is generally detectable in upper respiratory specimens during the acute phase of infection. The lowest concentration of SARS-CoV-2 viral copies this assay can detect is 138 copies/mL. A negative result does not preclude SARS-Cov-2 infection and should not be used as the sole basis for treatment or other patient management decisions. A negative result may occur with  improper specimen collection/handling, submission of specimen other than nasopharyngeal swab, presence of viral mutation(s) within the areas targeted by this assay, and inadequate number of viral copies(<138 copies/mL). A negative result must be combined with clinical observations, patient history, and epidemiological information. The expected result is Negative.  Fact Sheet for Patients:  EntrepreneurPulse.com.au  Fact Sheet for Healthcare Providers:  IncredibleEmployment.be  This test is no t yet approved or cleared by the [place] and  has been authorized for detection and/or diagnosis of SARS-CoV-2 by FDA under an Emergency Use Authorization (EUA). This EUA will remain  in effect (meaning this test can be used) for the duration of the COVID-19 declaration under Section 564(b)(1) of the Act, 21 U.S.C.section 360bbb-3(b)(1), unless the authorization is terminated  or revoked sooner.       Influenza A by PCR NEGATIVE NEGATIVE Final   Influenza B by PCR NEGATIVE NEGATIVE Final    Comment: (NOTE) The Xpert Xpress SARS-CoV-2/FLU/RSV plus assay is intended as an aid in the diagnosis of influenza from Nasopharyngeal  swab specimens and should not be used as a sole basis for treatment. Nasal washings and aspirates are unacceptable for Xpert Xpress SARS-CoV-2/FLU/RSV testing.  Fact Sheet for Patients: EntrepreneurPulse.com.au  Fact Sheet for Healthcare Providers: IncredibleEmployment.be  This test is not yet approved or cleared by the [place] FDA and has been authorized for detection and/or diagnosis of SARS-CoV-2 by FDA under an Emergency Use Authorization (EUA). This EUA will remain in effect (meaning this test can be used) for the duration of the COVID-19 declaration under Section 564(b)(1) of the Act, 21 U.S.C. section 360bbb-3(b)(1), unless the authorization is terminated or revoked.     Resp Syncytial Virus by PCR NEGATIVE NEGATIVE Final    Comment: (NOTE) Fact Sheet for Patients: EntrepreneurPulse.com.au  Fact Sheet for Healthcare Providers: IncredibleEmployment.be  This test is not yet approved or cleared by the [place] FDA and has been authorized for detection and/or diagnosis of SARS-CoV-2 by FDA under an Emergency Use Authorization (EUA). This EUA will remain in effect (meaning this test can be used) for the duration of the COVID-19 declaration under Section 564(b)(1) of the Act, 21 U.S.C. section 360bbb-3(b)(1), unless the authorization is terminated or revoked.  Performed at [facility], [address] [place], [place] [address]   Culture, blood (Routine x 2)     Status: None (Preliminary result)   Collection Time: 02/08/22  7:40 PM   Specimen: Porta Cath; Blood  Result Value Ref Range Status   Specimen Description   Final    PORTA CATH BOTTLES DRAWN AEROBIC AND ANAEROBIC Performed at [facility], [address], [place], [place] [address]    Special Requests   Final    Blood Culture adequate volume Performed at [facility], [address], [place], [place] [address]    Culture   Final     NO GROWTH < 24 HOURS Performed at [facility], [place] [address], [place], [place] [address]    Report Status PENDING  Incomplete  Urine Culture     Status: Abnormal   Collection Time: 02/09/22  1:40 AM   Specimen: Urine, Clean Catch  Result Value Ref Range Status   Specimen Description   Final    URINE, CLEAN CATCH Performed at [facility], [address], [place], [place] [address]    Special Requests   Final    NONE Performed at [facility], [address], [place], [place] [address]    Culture MULTIPLE SPECIES PRESENT, SUGGEST RECOLLECTION (A)  Final   Report Status 02/10/2022 FINAL  Final  Culture, blood (Routine x 2)     Status: None (Preliminary result)   Collection Time: 02/09/22  4:44 AM   Specimen: Porta Cath; Blood  Result Value Ref Range Status   Specimen Description PORTA CATH  Final   Special Requests  Final    BOTTLES DRAWN AEROBIC AND ANAEROBIC Blood Culture adequate volume Performed at [facility], [address], [place], [place] [address]    Culture PENDING  Incomplete   Report Status PENDING  Incomplete   Time coordinating discharge: 36 mins  SIGNED:  Irwin Brakeman, MD  [facility] Hospitalists 02/10/2022, 10:47 AM How to contact the [facility] Attending or Consulting provider [place] or covering provider during after hours [place], for this patient?  Check the care team in [facility] and look for a) attending/consulting TRH provider listed and b) the [facility] team listed Log into [email or web address] and use [place]'s universal password to access. If you do not have the password, please contact the hospital operator. Locate the [facility] provider you are looking for under [facility] and page to a number that you can be directly reached. If you still have difficulty reaching the provider, please page the [facility] (Director on Call) for the Hospitalists listed on amion for assistance.

## 2022-02-11 ENCOUNTER — Telehealth: Payer: Self-pay | Admitting: Family Medicine

## 2022-02-11 ENCOUNTER — Telehealth: Payer: Self-pay | Admitting: *Deleted

## 2022-02-11 ENCOUNTER — Encounter: Payer: Self-pay | Admitting: *Deleted

## 2022-02-11 NOTE — Patient Outreach (Signed)
  Care Coordination TOC Note Transition Care Management Follow-up Telephone Call Date of discharge and from where: [name] on 02/10/22 How have you been since you were released from the [facility]? Trying to rest and recover Any questions or concerns? Yes. Has oxygen but unsure where it came from and who ordered it. Would like to return it. Researched and oxygen was ordered through [facility] discharge in Oct 2023. Patient is not using O2 and does not need it. This RNCC will collaborate with Dr Renee [facility] office regarding need for d/c order to be sent to [facility]. She also needs a new walker. Her's is 77 years old and broken. Will collaborate with Dr Renee [facility] office about ordering a new walker at her upcoming face-to-face visit  Items Reviewed: Did the pt receive and understand the discharge instructions provided? Yes  Medications obtained and verified? Yes  Other? Yes . Receiving palliative care services through [facility] Any new allergies since your discharge? No  Dietary orders reviewed? Yes Do you have support at home? Yes   Home Care and Equipment/Supplies: Were home health services ordered? yes If so, what is the name of the agency? [facility]  Has the agency set up a time to come to the patient's home? yes Were any new equipment or medical supplies ordered?  No What is the name of the medical supply agency?  Were you able to get the supplies/equipment? not applicable Do you have any questions related to the use of the equipment or supplies? No  Functional Questionnaire: (I = Independent and D = Dependent) ADLs: D  Bathing/Dressing- D  Meal Prep- D  Eating- I  Maintaining continence- I  Transferring/Ambulation- I  Managing Meds- D  Follow up appointments reviewed:  PCP [facility] f/u appt confirmed? Yes  Scheduled to see Renee Sensor Polanco,FNP on 02/18/22 at 10:00 Renee [facility] f/u appt confirmed? Yes  Scheduled to see Dr Renee [facility] on 02/16/22 at 9:30 Are  transportation arrangements needed? No  If their condition worsens, is the pt aware to call PCP or go to the Emergency Dept.? Yes Was the patient provided with contact information for the PCP's office or ED? Yes Was to pt encouraged to call back with questions or concerns? Yes  SDOH assessments and interventions completed:   Yes SDOH Interventions Today    Flowsheet Row Most Recent Value  SDOH Interventions   Housing Interventions Intervention Not Indicated  Transportation Interventions Intervention Not Indicated       Care Coordination Interventions:  PCP follow up appointment requested Collaborated with PCP office regarding need for wheelchair order and order to D/C oxygen through [facility] with Renee Lair, NP who had a telephone visit today and has a home visit scheduled for tomorrow   Encounter Outcome:  Pt. Visit Completed    Renee [facility], BSN, RN-BC RN Care Coordinator [place]: [phone] Main #: [phone]

## 2022-02-11 NOTE — Patient Outreach (Signed)
  Care Coordination   Initial Visit Note   02/11/2022 Name: Renee [facility] MRN: [id number] DOB: September 03, 1945  Renee [facility] is a 77 y.o. year old female who sees [name], MD for primary care. I spoke with  Renee [facility] by phone today.  What matters to the patients health and wellness today?  Recovery from hospitalization, discharged 02/10/22.  (AKI, Hyperkalemia (resolved), UTI, Transaminitis, Chronic normocytic anemia, thrombocytopenia, myeloproliferative neoplasm, HTN, GERD)  Received emergent referral for care management services from [facility] .  Talked with pt and she says she does not need assistance right away. Her husband confirms that [facility] has contacted them and they will call on Monday to schedule their first home visit. 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 Pt says she has not been able to call Suanne Marker yet and this NP advised she would do so (call made and left a message).  No [facility]N services are needed at this time.   Eulah [place]. Myrtie Neither, MSN, [place] Gerontological Nurse Practitioner [facility] Care Management [phone]

## 2022-02-11 NOTE — Progress Notes (Signed)
  Care Coordination   Note   02/11/2022 Name: Renee [facility] MRN: [id number] DOB: [birth year]  Renee [facility] is a 77 y.o. year old female who sees [name], MD for primary care. I reached out to [name] by phone today to offer care coordination services.  Renee Waters was given information about Care Coordination services today including:   The Care Coordination services include support from the care team which includes your Nurse Coordinator, Clinical Social Worker, or Pharmacist.  The Care Coordination team is here to help remove barriers to the health concerns and goals most important to you. Care Coordination services are voluntary, and the patient may decline or stop services at any time by request to their care team member.   Care Coordination Consent Status: Patient agreed to services and verbal consent obtained.   Follow up plan:  Telephone appointment with care coordination team member scheduled for:  02/11/22  Encounter Outcome:  Pt. Scheduled  [place]  Direct Dial: [phone]

## 2022-02-11 NOTE — Patient Outreach (Signed)
  Care Coordination TOC Note Transition Care Management Unsuccessful Follow-up Telephone Call  Date of discharge and from where:  [facility] on 02/10/22  Attempts:  1st Attempt  Reason for unsuccessful TCM follow-up call:  Unable to reach patient. Patient was sleeping at time of call. I did talk with her husband and advised that they will be receiving a call to schedule a f/u with Dr Griffin Dakin office and they should also expect a call from [facility] to schedule services. Message sent to scheduling care guides to setup a hospital appt within a week. I will attempt to follow-up with her this afternoon.   Chong Sicilian, BSN, RN-BC RN Care Coordinator [place] Direct Dial: [phone] Main #: [phone]

## 2022-02-11 NOTE — Telephone Encounter (Signed)
Scheduled in office visit with pcp for TOC follow up.  Looks like only 1 attempt to reach patient for [facility] call.

## 2022-02-13 LAB — CULTURE, BLOOD (ROUTINE X 2)
Culture: NO GROWTH
Special Requests: ADEQUATE

## 2022-02-14 ENCOUNTER — Telehealth: Payer: Self-pay | Admitting: Dietician

## 2022-02-14 ENCOUNTER — Inpatient Hospital Stay: Payer: Medicare Other | Admitting: Dietician

## 2022-02-14 DIAGNOSIS — K219 Gastro-esophageal reflux disease without esophagitis: Secondary | ICD-10-CM | POA: Diagnosis not present

## 2022-02-14 DIAGNOSIS — I129 Hypertensive chronic kidney disease with stage 1 through stage 4 chronic kidney disease, or unspecified chronic kidney disease: Secondary | ICD-10-CM | POA: Diagnosis not present

## 2022-02-14 DIAGNOSIS — R7401 Elevation of levels of liver transaminase levels: Secondary | ICD-10-CM | POA: Diagnosis not present

## 2022-02-14 DIAGNOSIS — G709 Myoneural disorder, unspecified: Secondary | ICD-10-CM | POA: Diagnosis not present

## 2022-02-14 DIAGNOSIS — N390 Urinary tract infection, site not specified: Secondary | ICD-10-CM | POA: Diagnosis not present

## 2022-02-14 DIAGNOSIS — Z79891 Long term (current) use of opiate analgesic: Secondary | ICD-10-CM | POA: Diagnosis not present

## 2022-02-14 DIAGNOSIS — C946 Myelodysplastic disease, not classified: Secondary | ICD-10-CM | POA: Diagnosis not present

## 2022-02-14 DIAGNOSIS — Z87891 Personal history of nicotine dependence: Secondary | ICD-10-CM | POA: Diagnosis not present

## 2022-02-14 DIAGNOSIS — G8929 Other chronic pain: Secondary | ICD-10-CM | POA: Diagnosis not present

## 2022-02-14 DIAGNOSIS — D631 Anemia in chronic kidney disease: Secondary | ICD-10-CM | POA: Diagnosis not present

## 2022-02-14 DIAGNOSIS — G4700 Insomnia, unspecified: Secondary | ICD-10-CM | POA: Diagnosis not present

## 2022-02-14 DIAGNOSIS — M519 Unspecified thoracic, thoracolumbar and lumbosacral intervertebral disc disorder: Secondary | ICD-10-CM | POA: Diagnosis not present

## 2022-02-14 DIAGNOSIS — E860 Dehydration: Secondary | ICD-10-CM | POA: Diagnosis not present

## 2022-02-14 DIAGNOSIS — N179 Acute kidney failure, unspecified: Secondary | ICD-10-CM | POA: Diagnosis not present

## 2022-02-14 DIAGNOSIS — D696 Thrombocytopenia, unspecified: Secondary | ICD-10-CM | POA: Diagnosis not present

## 2022-02-14 DIAGNOSIS — K2270 Barrett's esophagus without dysplasia: Secondary | ICD-10-CM | POA: Diagnosis not present

## 2022-02-14 DIAGNOSIS — F419 Anxiety disorder, unspecified: Secondary | ICD-10-CM | POA: Diagnosis not present

## 2022-02-14 DIAGNOSIS — M329 Systemic lupus erythematosus, unspecified: Secondary | ICD-10-CM | POA: Diagnosis not present

## 2022-02-14 DIAGNOSIS — N183 Chronic kidney disease, stage 3 unspecified: Secondary | ICD-10-CM | POA: Diagnosis not present

## 2022-02-14 DIAGNOSIS — M542 Cervicalgia: Secondary | ICD-10-CM | POA: Diagnosis not present

## 2022-02-14 DIAGNOSIS — M549 Dorsalgia, unspecified: Secondary | ICD-10-CM | POA: Diagnosis not present

## 2022-02-14 DIAGNOSIS — F32A Depression, unspecified: Secondary | ICD-10-CM | POA: Diagnosis not present

## 2022-02-14 DIAGNOSIS — M1990 Unspecified osteoarthritis, unspecified site: Secondary | ICD-10-CM | POA: Diagnosis not present

## 2022-02-14 NOTE — Telephone Encounter (Signed)
Call placed to patient per 02/10/22 Nutrition Referral.  Patient did not answer. Left VM with request for return call. Contact information provided. Received secure chat from RN Renda Rolls) stating husband called to cancel pt appointment today. He also requested to reschedule 1/15 nutrition appointment.   Noted upcoming 1/10 office visit at [facility]. Will request scheduling to offer another appointment at that time.

## 2022-02-15 ENCOUNTER — Inpatient Hospital Stay: Payer: Medicare Other | Attending: Hematology

## 2022-02-15 VITALS — BP 147/77 | HR 61 | Temp 96.8°F

## 2022-02-15 DIAGNOSIS — D471 Chronic myeloproliferative disease: Secondary | ICD-10-CM | POA: Diagnosis not present

## 2022-02-15 DIAGNOSIS — I129 Hypertensive chronic kidney disease with stage 1 through stage 4 chronic kidney disease, or unspecified chronic kidney disease: Secondary | ICD-10-CM | POA: Insufficient documentation

## 2022-02-15 DIAGNOSIS — N183 Chronic kidney disease, stage 3 unspecified: Secondary | ICD-10-CM | POA: Insufficient documentation

## 2022-02-15 DIAGNOSIS — Z87891 Personal history of nicotine dependence: Secondary | ICD-10-CM | POA: Insufficient documentation

## 2022-02-15 DIAGNOSIS — D649 Anemia, unspecified: Secondary | ICD-10-CM

## 2022-02-15 DIAGNOSIS — L299 Pruritus, unspecified: Secondary | ICD-10-CM | POA: Insufficient documentation

## 2022-02-15 DIAGNOSIS — Z79899 Other long term (current) drug therapy: Secondary | ICD-10-CM | POA: Diagnosis not present

## 2022-02-15 DIAGNOSIS — Z95828 Presence of other vascular implants and grafts: Secondary | ICD-10-CM

## 2022-02-15 DIAGNOSIS — E876 Hypokalemia: Secondary | ICD-10-CM | POA: Insufficient documentation

## 2022-02-15 LAB — CBC WITH DIFFERENTIAL/PLATELET
Abs Immature Granulocytes: 49.9 10*3/uL — ABNORMAL HIGH (ref 0.00–0.07)
Band Neutrophils: 1 %
Basophils Absolute: 0 10*3/uL (ref 0.0–0.1)
Basophils Relative: 0 %
Blasts: 2 %
Eosinophils Absolute: 0 10*3/uL (ref 0.0–0.5)
Eosinophils Relative: 0 %
HCT: 24.6 % — ABNORMAL LOW (ref 36.0–46.0)
Hemoglobin: 7.7 g/dL — ABNORMAL LOW (ref 12.0–15.0)
Lymphocytes Relative: 2 %
Lymphs Abs: 2.3 10*3/uL (ref 0.7–4.0)
MCH: 30.1 pg (ref 26.0–34.0)
MCHC: 31.3 g/dL (ref 30.0–36.0)
MCV: 96.1 fL (ref 80.0–100.0)
Metamyelocytes Relative: 20 %
Monocytes Absolute: 5.7 10*3/uL — ABNORMAL HIGH (ref 0.1–1.0)
Monocytes Relative: 5 %
Myelocytes: 6 %
Neutro Abs: 53.3 10*3/uL — ABNORMAL HIGH (ref 1.7–7.7)
Neutrophils Relative %: 46 %
Platelets: 190 10*3/uL (ref 150–400)
Promyelocytes Relative: 18 %
RBC: 2.56 MIL/uL — ABNORMAL LOW (ref 3.87–5.11)
RDW: 24.9 % — ABNORMAL HIGH (ref 11.5–15.5)
WBC: 113.4 10*3/uL (ref 4.0–10.5)
nRBC: 3.5 % — ABNORMAL HIGH (ref 0.0–0.2)

## 2022-02-15 LAB — SAMPLE TO BLOOD BANK

## 2022-02-15 LAB — PREPARE RBC (CROSSMATCH)

## 2022-02-15 LAB — BPAM RBC: Unit Type and Rh: 5100

## 2022-02-15 MED ORDER — HEPARIN SOD (PORK) LOCK FLUSH 100 UNIT/ML IV SOLN
500.0000 [IU] | Freq: Once | INTRAVENOUS | Status: AC
  Administered 2022-02-15: 500 [IU] via INTRAVENOUS

## 2022-02-15 MED ORDER — SODIUM CHLORIDE 0.9% FLUSH
10.0000 mL | INTRAVENOUS | Status: DC | PRN
  Administered 2022-02-15: 10 mL via INTRAVENOUS

## 2022-02-15 NOTE — Patient Instructions (Signed)
[place]  Discharge Instructions: Thank you for choosing [facility] to provide your oncology and hematology care.  If you have a lab appointment with the [facility], please come in thru the Main Entrance and check in at the main information desk.  Wear comfortable clothing and clothing appropriate for easy access to any Portacath or PICC line.   We strive to give you quality time with your provider. You may need to reschedule your appointment if you arrive late (15 or more minutes).  Arriving late affects you and other patients whose appointments are after yours.  Also, if you miss three or more appointments without notifying the office, you may be dismissed from the clinic at the provider's discretion.      For prescription refill requests, have your pharmacy contact our office and allow 72 hours for refills to be    To help prevent nausea and vomiting after your treatment, we encourage you to take your nausea medication as directed.  BELOW ARE SYMPTOMS THAT SHOULD BE REPORTED IMMEDIATELY: *FEVER GREATER THAN 100.4 F (38 C) OR HIGHER *CHILLS OR SWEATING *NAUSEA AND VOMITING THAT IS NOT CONTROLLED WITH YOUR NAUSEA MEDICATION *UNUSUAL SHORTNESS OF BREATH *UNUSUAL BRUISING OR BLEEDING *URINARY PROBLEMS (pain or burning when urinating, or frequent urination) *BOWEL PROBLEMS (unusual diarrhea, constipation, pain near the anus) TENDERNESS IN MOUTH AND THROAT WITH OR WITHOUT PRESENCE OF ULCERS (sore throat, sores in mouth, or a toothache) UNUSUAL RASH, SWELLING OR PAIN  UNUSUAL VAGINAL DISCHARGE OR ITCHING   Items with * indicate a potential emergency and should be followed up as soon as possible or go to the Emergency Department if any problems should occur.  Please show the CHEMOTHERAPY ALERT CARD or IMMUNOTHERAPY ALERT CARD at check-in to the Emergency Department and triage nurse.  Should you have questions after your visit or need to cancel or  reschedule your appointment, please contact [place] [phone]  and follow the prompts.  Office hours are 8:00 a.m. to 4:30 p.m. Monday - Friday. Please note that voicemails left after 4:00 p.m. may not be returned until the following business day.  We are closed weekends and major holidays. You have access to a nurse at all times for urgent questions. Please call the main number to the clinic [phone] and follow the prompts.  For any non-urgent questions, you may also contact your provider using MyChart. We now offer e-Visits for anyone 48 and older to request care online for non-urgent symptoms. For details visit [email or web address].   Also download the MyChart app! Go to the app store, search "MyChart", open the app, select [place], and log in with your MyChart username and password.

## 2022-02-15 NOTE — Progress Notes (Signed)
Patients port flushed without difficulty.  Good blood return noted with no bruising or swelling noted at site.  Band aid applied.  VSS with discharge and left in satisfactory condition with no s/s of distress noted.   

## 2022-02-15 NOTE — Addendum Note (Signed)
Addended by: Benjiman Core D on: 02/15/2022 02:52 PM   Modules accepted: Orders

## 2022-02-15 NOTE — Progress Notes (Signed)
CRITICAL VALUE ALERT Critical value received:  WBC 113.4. Date of notification:  02-15-2022 Time of notification: 1141am.  Critical value read back:  Yes.   Nurse who received alert:  B. Eason Housman RN.  MD notified time and response:  [name] @ 1236 pm. No new orders received. Patient's normal.

## 2022-02-15 NOTE — Progress Notes (Signed)
HGB 7.7 per orders from [name] patient to receive 1 unit of blood 02-16-2022

## 2022-02-16 ENCOUNTER — Inpatient Hospital Stay (HOSPITAL_BASED_OUTPATIENT_CLINIC_OR_DEPARTMENT_OTHER): Payer: Medicare Other | Admitting: Hematology

## 2022-02-16 ENCOUNTER — Encounter: Payer: Self-pay | Admitting: Hematology

## 2022-02-16 ENCOUNTER — Inpatient Hospital Stay: Payer: Medicare Other

## 2022-02-16 ENCOUNTER — Telehealth: Payer: Self-pay | Admitting: Family Medicine

## 2022-02-16 VITALS — BP 109/61 | HR 50 | Temp 98.2°F | Resp 18

## 2022-02-16 DIAGNOSIS — Z87891 Personal history of nicotine dependence: Secondary | ICD-10-CM | POA: Diagnosis not present

## 2022-02-16 DIAGNOSIS — D72829 Elevated white blood cell count, unspecified: Secondary | ICD-10-CM | POA: Diagnosis not present

## 2022-02-16 DIAGNOSIS — D471 Chronic myeloproliferative disease: Secondary | ICD-10-CM

## 2022-02-16 DIAGNOSIS — I129 Hypertensive chronic kidney disease with stage 1 through stage 4 chronic kidney disease, or unspecified chronic kidney disease: Secondary | ICD-10-CM | POA: Diagnosis not present

## 2022-02-16 DIAGNOSIS — D649 Anemia, unspecified: Secondary | ICD-10-CM | POA: Diagnosis not present

## 2022-02-16 DIAGNOSIS — L299 Pruritus, unspecified: Secondary | ICD-10-CM | POA: Diagnosis not present

## 2022-02-16 DIAGNOSIS — E876 Hypokalemia: Secondary | ICD-10-CM | POA: Diagnosis not present

## 2022-02-16 DIAGNOSIS — N183 Chronic kidney disease, stage 3 unspecified: Secondary | ICD-10-CM | POA: Diagnosis not present

## 2022-02-16 DIAGNOSIS — Z95828 Presence of other vascular implants and grafts: Secondary | ICD-10-CM

## 2022-02-16 LAB — COMPREHENSIVE METABOLIC PANEL
ALT: 16 U/L (ref 0–44)
AST: 31 U/L (ref 15–41)
Albumin: 3.6 g/dL (ref 3.5–5.0)
Alkaline Phosphatase: 145 U/L — ABNORMAL HIGH (ref 38–126)
Anion gap: 8 (ref 5–15)
BUN: 29 mg/dL — ABNORMAL HIGH (ref 8–23)
CO2: 24 mmol/L (ref 22–32)
Calcium: 8.6 mg/dL — ABNORMAL LOW (ref 8.9–10.3)
Chloride: 107 mmol/L (ref 98–111)
Creatinine, Ser: 1.74 mg/dL — ABNORMAL HIGH (ref 0.44–1.00)
GFR, Estimated: 30 mL/min — ABNORMAL LOW (ref >60)
Glucose, Bld: 91 mg/dL (ref 70–99)
Potassium: 4.1 mmol/L (ref 3.5–5.1)
Sodium: 139 mmol/L (ref 135–145)
Total Bilirubin: 0.8 mg/dL (ref 0.3–1.2)
Total Protein: 7.6 g/dL (ref 6.5–8.1)

## 2022-02-16 LAB — BPAM RBC: Blood Product Expiration Date: 202402172359

## 2022-02-16 MED ORDER — ACETAMINOPHEN 325 MG PO TABS
650.0000 mg | ORAL_TABLET | Freq: Once | ORAL | Status: AC
  Administered 2022-02-16: 650 mg via ORAL
  Filled 2022-02-16: qty 2

## 2022-02-16 MED ORDER — HEPARIN SOD (PORK) LOCK FLUSH 100 UNIT/ML IV SOLN: 500.0000 [IU] | Freq: Once | INTRAVENOUS | Status: DC

## 2022-02-16 MED ORDER — DIPHENHYDRAMINE HCL 25 MG PO CAPS
25.0000 mg | ORAL_CAPSULE | Freq: Once | ORAL | Status: AC
  Administered 2022-02-16: 25 mg via ORAL
  Filled 2022-02-16: qty 1

## 2022-02-16 MED ORDER — SODIUM CHLORIDE 0.9% IV SOLUTION
250.0000 mL | Freq: Once | INTRAVENOUS | Status: AC
  Administered 2022-02-16: 250 mL via INTRAVENOUS

## 2022-02-16 MED ORDER — SODIUM CHLORIDE 0.9% FLUSH: 10.0000 mL | INTRAVENOUS | Status: DC | PRN

## 2022-02-16 NOTE — Patient Instructions (Signed)
[facility]  Discharge Instructions: Thank you for choosing [facility] to provide your oncology and hematology care.  If you have a lab appointment with the Cancer Center, please come in thru the Main Entrance and check in at the main information desk.  Wear comfortable clothing and clothing appropriate for easy access to any Portacath or PICC line.   We strive to give you quality time with your provider. You may need to reschedule your appointment if you arrive late (15 or more minutes).  Arriving late affects you and other patients whose appointments are after yours.  Also, if you miss three or more appointments without notifying the office, you may be dismissed from the clinic at the provider's discretion.      For prescription refill requests, have your pharmacy contact our office and allow 72 hours for refills to be completed.    Today you received the following 1 unit of PRBCs, return as scheduled.   To help prevent nausea and vomiting after your treatment, we encourage you to take your nausea medication as directed.  BELOW ARE SYMPTOMS THAT SHOULD BE REPORTED IMMEDIATELY: *FEVER GREATER THAN 100.4 F (38 C) OR HIGHER *CHILLS OR SWEATING *NAUSEA AND VOMITING THAT IS NOT CONTROLLED WITH YOUR NAUSEA MEDICATION *UNUSUAL SHORTNESS OF BREATH *UNUSUAL BRUISING OR BLEEDING *URINARY PROBLEMS (pain or burning when urinating, or frequent urination) *BOWEL PROBLEMS (unusual diarrhea, constipation, pain near the anus) TENDERNESS IN MOUTH AND THROAT WITH OR WITHOUT PRESENCE OF ULCERS (sore throat, sores in mouth, or a toothache) UNUSUAL RASH, SWELLING OR PAIN  UNUSUAL VAGINAL DISCHARGE OR ITCHING   Items with * indicate a potential emergency and should be followed up as soon as possible or go to the Emergency Department if any problems should occur.  Please show the CHEMOTHERAPY ALERT CARD or IMMUNOTHERAPY ALERT CARD at check-in to the Emergency Department  and triage nurse.  Should you have questions after your visit or need to cancel or reschedule your appointment, please contact [facility] [phone]  and follow the prompts.  Office hours are 8:00 a.m. to 4:30 p.m. Monday - Friday. Please note that voicemails left after 4:00 p.m. may not be returned until the following business day.  We are closed weekends and major holidays. You have access to a nurse at all times for urgent questions. Please call the main number to the clinic [phone] and follow the prompts.  For any non-urgent questions, you may also contact your provider using MyChart. We now offer e-Visits for anyone 18 and older to request care online for non-urgent symptoms. For details visit [email or web address].   Also download the MyChart app! Go to the app store, search "MyChart", open the app, select [place], and log in with your MyChart username and password.   

## 2022-02-16 NOTE — Progress Notes (Signed)
Patient tolerated blood transfusion with no complaints voiced. Side effects with management reviewed with understanding verbalized. Port site clean and dry with no bruising or swelling noted at site. Good blood return noted before and after administration of therapy. Band aid applied. Patient left in satisfactory condition with VSS and no s/s of distress noted.

## 2022-02-16 NOTE — Progress Notes (Signed)
Patient is taking Ojjaara as prescribed.  She restarted on 02/11/2022 after discharge from the hospital where it was held and has not missed any doses since that time and reports no side effects at this time.

## 2022-02-16 NOTE — Patient Instructions (Addendum)
[place] at [facility] Discharge Instructions   You were seen and examined today by [name].  He reviewed the results of your CBC from yesterday. Your white blood cell count was 113 and your hemoglobin was 7.7. You will receive one unit of blood today to help improve your hemoglobin.   We also checked your kidney function today. Your creatinine is 1.74 and is at your baseline.   Continue Ojjaara as prescribed.   Increase your fluid intake to 70 ounces a day.   Return as scheduled.    Thank you for choosing [place] at [facility] to provide your oncology and hematology care.  To afford each patient quality time with our provider, please arrive at least 15 minutes before your scheduled appointment time.   If you have a lab appointment with the [facility] please come in thru the Main Entrance and check in at the main information desk.  You need to re-schedule your appointment should you arrive 10 or more minutes late.  We strive to give you quality time with our providers, and arriving late affects you and other patients whose appointments are after yours.  Also, if you no show three or more times for appointments you may be dismissed from the clinic at the providers discretion.     Again, thank you for choosing [facility].  Our hope is that these requests will decrease the amount of time that you wait before being seen by our physicians.       _____________________________________________________________  Should you have questions after your visit to [facility], please contact our office at [phone] and follow the prompts.  Our office hours are 8:00 a.m. and 4:30 p.m. Monday - Friday.  Please note that voicemails left after 4:00 p.m. may not be returned until the following business day.  We are closed weekends and major holidays.  You do have access to a nurse 24-7, just call the main number to the  clinic [phone] and do not press any options, hold on the line and a nurse will answer the phone.    For prescription refill requests, have your pharmacy contact our office and allow 72 hours.    Due to Covid, you will need to wear a mask upon entering the hospital. If you do not have a mask, a mask will be given to you at the Main Entrance upon arrival. For doctor visits, patients may have 1 support person age 45 or older with them. For treatment visits, patients can not have anyone with them due to social distancing guidelines and our immunocompromised population.

## 2022-02-16 NOTE — Progress Notes (Signed)
UNMATCHED BLOOD PRODUCT NOTE  Compare the patient ID on the blood tag to the patient ID on the hospital armband and Blood Bank armband. Then confirm the unit number on the blood tag matches the unit number on the blood product.  If a discrepancy is discovered return the product to blood bank immediately.   Blood Product Type: Packed Red Blood Cells  Unit #: (Found on blood product bag, begins with W) [id number]  Product Code #: (Found on blood product bag, begins with E) [id number]   Start Time: 1055  Starting Rate: 120 ml/hr  Rate increase/decreased  (if applicable): 536  ml/hr  Rate changed time (if applicable): 6440   Stop Time: 1220   All Other Documentation should be documented within the Blood Admin Flowsheet per policy.

## 2022-02-16 NOTE — Telephone Encounter (Signed)
No voicemail- kg

## 2022-02-16 NOTE — Telephone Encounter (Signed)
Verbal orders given  

## 2022-02-16 NOTE — Progress Notes (Signed)
[place], [place] [address]   CLINIC:  Medical Oncology/Hematology  PCP:  Renee Helper, MD [address], [place] / [place] [address] [phone]   REASON FOR VISIT:  Follow-up for MPL positive myeloproliferative neoplasm  PRIOR THERAPY: Jakafi 10 mg twice daily  NGS Results: not done  CURRENT THERAPY: Momelotinib 100 mg daily  INTERVAL HISTORY:  [name], a 77 y.o. female, seen for follow-up of MPL positive MPN.  Recently hospitalized from 02/08/2022 through 02/10/2022 with decreased p.o. intake and weakness.  Diagnosed with UTI and was treated.  She is feeling much better.  Her medication was temporarily stopped while she was hospitalized and she restarted back on 02/11/2022.  Eating 2 meals per day.  Drinking fluids 48 to 50 ounces per day.  Occasional nausea once per day which is controlled well with nausea medication.  Itching is slightly better.  REVIEW OF SYSTEMS:  Review of Systems  Respiratory:  Positive for cough.   Gastrointestinal:  Positive for nausea.  Musculoskeletal:  Positive for arthralgias (Hip, back and legs stable).  Skin:  Positive for itching.  Psychiatric/Behavioral:  Positive for sleep disturbance. The patient is nervous/anxious.   All other systems reviewed and are negative.   PAST MEDICAL/SURGICAL HISTORY:  Past Medical History:  Diagnosis Date   Acute cholangitis    Allergy    Anemia    Anxiety    Arthritis    Phreesia 03/18/2020   Barrett's esophagus    Cataract    Chronic back pain    Chronic neck pain    CKD (chronic kidney disease) stage 3, GFR 30-59 ml/min ([place]) 10/29/2020   Depression    Genital herpes    GERD (gastroesophageal reflux disease)    H/O degenerative disc disease    History of blood transfusion    Hypertension    Insomnia    Lupus (systemic lupus erythematosus) ([place])    Neuromuscular disorder ([place])    Osteoarthritis    S/P colonoscopy June 2005   normal,  no polyps   S/P endoscopy June 2005, Oct 2009   2005: short-segment Barrett's, 2009: short-segment Barrett's   Upper respiratory tract infection 07/09/2020   UTI (lower urinary tract infection) 11/2012   Past Surgical History:  Procedure Laterality Date   ABDOMINAL HYSTERECTOMY     BACK SURGERY     BIOPSY N/A 03/20/2014   Procedure: BIOPSY;  Surgeon: Daneil Dolin, MD;  Location: [facility];  Service: Endoscopy;  Laterality: N/A;   BIOPSY  09/14/2015   Procedure: BIOPSY;  Surgeon: Daneil Dolin, MD;  Location: [facility];  Service: Endoscopy;;  esophageal and gastric   BIOPSY  07/23/2019   Procedure: BIOPSY;  Surgeon: Daneil Dolin, MD;  [facility];  Service: Endoscopy;;  esophageal    CARPAL TUNNEL RELEASE Left 2013   cervical disectomy  2002   CESAREAN SECTION N/A    Phreesia 03/18/2020   CHOLECYSTECTOMY     with lysis of adhesions for sbo; "ruptured gallbladder".   COLONOSCOPY  11/09/2011   RMR: Melanosis coli   COLONOSCOPY WITH PROPOFOL N/A 09/14/2015   Dr. Gala Romney: diverticulosis, 68m TA removed. next TCS 09/2020.    COLONOSCOPY WITH PROPOFOL N/A 04/30/2020   Procedure: COLONOSCOPY WITH PROPOFOL;  Surgeon: RDaneil Dolin MD;  [facility];  Service: Endoscopy;  Laterality: N/A;  PM (ASA 3)   DENTAL SURGERY  11/2015   multiple tooth extraction  ESOPHAGOGASTRODUODENOSCOPY  11/29/2007   salmon-colored  tongue   longest stable at  3 cm, distal esophagus as described previously status post biopsy/ Hiatal hernia, otherwise normal stomach D1 and D2   ESOPHAGOGASTRODUODENOSCOPY  01/06/11   short segment Barrett's esophagus s/p bx/Hiatal hernia   ESOPHAGOGASTRODUODENOSCOPY (EGD) WITH PROPOFOL N/A 03/20/2014   RDE:YCXKGYJE distal esophagus short segment barrett's, bx with no dysplasia. next egd in 03/2017   ESOPHAGOGASTRODUODENOSCOPY (EGD) WITH PROPOFOL N/A 09/14/2015   Dr. Gala Romney: Barrett's without dysplasia, gastritis benign bx, hiatal hernia. next EGD 09/2018.    ESOPHAGOGASTRODUODENOSCOPY (EGD) WITH PROPOFOL N/A 07/23/2019   Procedure: ESOPHAGOGASTRODUODENOSCOPY (EGD) WITH PROPOFOL;  Surgeon: Daneil Dolin, MD;  [facility];  Service: Endoscopy;  Laterality: N/A;  3:00pm   EYE SURGERY N/A    Phreesia 03/18/2020   HERNIA REPAIR Right 07/2010   Dr. Zada Girt   IR IMAGING GUIDED PORT INSERTION  02/09/2021   JOINT REPLACEMENT     LAPAROSCOPIC CHOLECYSTECTOMY  2017   at [facility]   POLYPECTOMY  09/14/2015   Procedure: POLYPECTOMY;  Surgeon: Daneil Dolin, MD;  [facility];  Service: Endoscopy;;  ascending colon   right hip replacement  07/2010   went back in sept 2012 to fix   SHOULDER ARTHROSCOPY  2008   left   SPINE SURGERY N/A    Phreesia 03/18/2020   TOTAL HIP REVISION Right 12/17/2012   Procedure: RIGHT TOTAL HIP REVISION;  Surgeon: Mauri Pole, MD;  Location: [facility];  Service: Orthopedics;  Laterality: Right;   WRIST SURGERY Right 2011   open reduction right wrist.    SOCIAL HISTORY:  Social History   Socioeconomic History   Marital status: Married    Spouse name: louis   Number of children: 4   Years of education: 12+   Highest education level: Some college, no degree  Occupational History   Occupation: Press photographer  - retired   Occupation: non profit  Tobacco Use   Smoking status: Former    Packs/day: 0.25    Years: 25.00    Total pack years: 6.25    Types: Cigarettes    Quit date: 02/07/2003    Years since quitting: 19.0   Smokeless tobacco: Never   Tobacco comments:    quit in 2004  Vaping Use   Vaping Use: Never used  Substance and Sexual Activity   Alcohol use: No   Drug use: No   Sexual activity: Not Currently    Birth control/protection: Surgical  Other Topics Concern   Not on file  Social History Narrative   Not on file   Social Determinants of Health   Financial Resource Strain: Low Risk  (12/28/2020)   Overall Financial Resource Strain (CARDIA)    Difficulty of Paying Living  Expenses: Not hard at all  Food Insecurity: No Food Insecurity (02/08/2022)   Hunger Vital Sign    Worried About Running Out of Food in the Last Year: Never true    Talent in the Last Year: Never true  Transportation Needs: No Transportation Needs (02/11/2022)   PRAPARE - Hydrologist (Medical): No    Lack of Transportation (Non-Medical): No  Physical Activity: Inactive (04/23/2021)   Exercise Vital Sign    Days of Exercise per Week: 0 days    Minutes of Exercise per Session: 0 min  Stress: Stress Concern Present (04/23/2021)   [place]  Feeling of Stress : Rather much  Social Connections: Moderately Integrated (12/28/2020)   Social Connection and Isolation Panel [NHANES]    Frequency of Communication with Friends and Family: More than three times a week    Frequency of Social Gatherings with Friends and Family: Once a week    Attends Religious Services: More than 4 times per year    Active Member of [employer] or Organizations: No    Attends Archivist Meetings: Never    Marital Status: Married  Human resources officer Violence: Not At Risk (02/08/2022)   Humiliation, Afraid, Rape, and Kick questionnaire    Fear of Current or Ex-Partner: No    Emotionally Abused: No    Physically Abused: No    Sexually Abused: No    FAMILY HISTORY:  Family History  Problem Relation Age of Onset   Hypertension Mother    Stroke Mother    Colon cancer Neg Hx    Anesthesia problems Neg Hx    Hypotension Neg Hx    Malignant hyperthermia Neg Hx    Pseudochol deficiency Neg Hx    Gastric cancer Neg Hx    Esophageal cancer Neg Hx     CURRENT MEDICATIONS:  Current Outpatient Medications  Medication Sig Dispense Refill   acyclovir (ZOVIRAX) 800 MG tablet Take 1 tablet (800 mg total) by mouth 5 (five) times daily as needed. 50 tablet 1   acyclovir ointment (ZOVIRAX) 5 % Apply 1 Application  topically every 3 (three) hours as needed. 15 g 1   allopurinol 200 MG TABS Take 200 mg by mouth daily. 30 tablet 0   busPIRone (BUSPAR) 5 MG tablet Take 1 tablet (5 mg total) by mouth daily as needed (anxiety). Take one tablet by mouth once daily, as needed, for anxiety     cetirizine (ZYRTEC) 10 MG tablet Take 10 mg by mouth daily.     citalopram (CELEXA) 20 MG tablet Take 1 tablet by mouth once daily 30 tablet 0   EPINEPHrine 0.3 mg/0.3 mL IJ SOAJ injection INJECT 0.3 MLS INTO MUSCLE ONCE AS NEEDED 1 each 2   furosemide (LASIX) 20 MG tablet TAKE 1 TABLET BY MOUTH ONCE DAILY AS NEEDED 30 tablet 3   hydrOXYzine (ATARAX) 25 MG tablet Take 12.5-25 mg by mouth 2 (two) times daily as needed.     lidocaine-prilocaine (EMLA) cream Apply 1 Application topically as needed (Access to port and pain). 30 g 6   metoprolol tartrate (LOPRESSOR) 25 MG tablet Take one and a half tablets twice daily 180 tablet 3   mirtazapine (REMERON) 15 MG tablet TAKE 1 TABLET BY MOUTH AT BEDTIME 30 tablet 0   Momelotinib Dihydrochloride (OJJAARA) 100 MG TABS Take 100 mg by mouth daily.     Multiple Vitamin (MULTIVITAMIN WITH MINERALS) TABS tablet Take 1 tablet by mouth daily.     oxyCODONE (ROXICODONE) 15 MG immediate release tablet Take 15 mg by mouth every 6 (six) hours as needed.     pantoprazole (PROTONIX) 40 MG tablet Take 1 tablet (40 mg total) by mouth 2 (two) times daily before a meal. 60 tablet 0   potassium chloride (KLOR-CON) 10 MEQ tablet Take 1 tablet by mouth once daily 30 tablet 4   prochlorperazine (COMPAZINE) 10 MG tablet Take 1 tablet (10 mg total) by mouth every 6 (six) hours as needed for nausea or vomiting. 60 tablet 1   temazepam (RESTORIL) 30 MG capsule Take 1 capsule (30 mg total) by mouth at bedtime as needed for  sleep. 30 capsule 5   tiZANidine (ZANAFLEX) 4 MG tablet Take by mouth.     No current facility-administered medications for this visit.   Facility-Administered Medications Ordered in Other  Visits  Medication Dose Route Frequency Provider Last Rate Last Admin   lanreotide acetate (SOMATULINE DEPOT) 120 MG/0.5ML injection            lanreotide acetate (SOMATULINE DEPOT) 120 MG/0.5ML injection            lanreotide acetate (SOMATULINE DEPOT) 120 MG/0.5ML injection            octreotide (SANDOSTATIN LAR) 30 MG IM injection            octreotide (SANDOSTATIN LAR) 30 MG IM injection            sodium chloride flush (NS) 0.9 % injection 10 mL  10 mL Intracatheter PRN Derek Jack, MD        ALLERGIES:  Allergies  Allergen Reactions   Bee Venom Swelling and Hives   Norvasc [Amlodipine] Other (See Comments)    Hair loss   Tylenol [Acetaminophen] Itching   Hydralazine Other (See Comments)    Hair loss   Red Dye Itching   Percocet [Oxycodone-Acetaminophen] Rash and Other (See Comments)    Pt states, "this gives her a rash, but at home she takes oxycodone for pain relief"    Tyloxapol Itching and Rash    PHYSICAL EXAM:  Performance status (ECOG): 1 - Symptomatic but completely ambulatory  Vitals:   02/16/22 0934  BP: (!) 165/69  Pulse: 61  Resp: 18  Temp: 98.6 F (37 C)  SpO2: 96%    Wt Readings from Last 3 Encounters:  02/16/22 124 lb 9.6 oz (56.5 kg)  02/09/22 131 lb 6.3 oz (59.6 kg)  01/26/22 131 lb 6.4 oz (59.6 kg)   Physical Exam Vitals reviewed.  Constitutional:      Appearance: Normal appearance.  Cardiovascular:     Rate and Rhythm: Normal rate and regular rhythm.     Pulses: Normal pulses.     Heart sounds: Normal heart sounds.  Pulmonary:     Effort: Pulmonary effort is normal.     Breath sounds: Normal breath sounds.  Abdominal:     Palpations: Abdomen is soft. There is hepatomegaly (palpable 3 fingerbreadths below costal margin) and splenomegaly (2 fingerbreadths below costal margin'). There is no mass.     Tenderness: There is no abdominal tenderness.  Musculoskeletal:     Right lower leg: 1+ Edema present.     Left lower leg: 1+  Edema present.  Neurological:     General: No focal deficit present.     Mental Status: She is alert and oriented to person, place, and time.  Psychiatric:        Mood and Affect: Mood normal.        Behavior: Behavior normal.      LABORATORY DATA:  I have reviewed the labs as listed.     Latest Ref Rng & Units 02/15/2022   11:05 AM 02/09/2022    4:44 AM 02/08/2022    7:40 PM  CBC  WBC 4.0 - 10.5 K/uL 113.4  101.3  109.7   Hemoglobin 12.0 - 15.0 g/dL 7.7  7.0  7.3   Hematocrit 36.0 - 46.0 % 24.6  22.6  23.6   Platelets 150 - 400 K/uL 190  99  98       Latest Ref Rng & Units 02/09/2022    4:44  AM 02/08/2022    7:40 PM 01/18/2022   11:37 AM  CMP  Glucose 70 - 99 mg/dL 109  97  120   BUN 8 - 23 mg/dL 48  53  20   Creatinine 0.44 - 1.00 mg/dL 3.00  3.87  1.30   Sodium 135 - 145 mmol/L 138  135  139   Potassium 3.5 - 5.1 mmol/L 3.7  5.8  4.1   Chloride 98 - 111 mmol/L 107  108  111   CO2 22 - 32 mmol/L '23  22  21   '$ Calcium 8.9 - 10.3 mg/dL 8.2  8.1  8.8   Total Protein 6.5 - 8.1 g/dL 6.6  7.4  7.6   Total Bilirubin 0.3 - 1.2 mg/dL 0.8  1.0  0.6   Alkaline Phos 38 - 126 U/L 137  154  281   AST 15 - 41 U/L 41  46  50   ALT 0 - 44 U/L 19  21  32     DIAGNOSTIC IMAGING:  I have independently reviewed the scans and discussed with the patient. DG Chest Port 1 View  Result Date: 02/08/2022 CLINICAL DATA:  Assesses suspected EXAM: PORTABLE CHEST 1 VIEW COMPARISON:  11/10/2021 FINDINGS: Right chest wall port with catheter tip overlying the SVC. Cardiac and mediastinal contours are within normal limits. No focal pulmonary opacity. No pleural effusion or pneumothorax. No acute osseous abnormality. IMPRESSION: No acute cardiopulmonary process. Electronically Signed   By: Merilyn Baba M.D.   On: 02/08/2022 18:25     ASSESSMENT:  MPL positive prefibrotic/early primary myelofibrosis: - CBC on 11/06/2020 with white count 75.6, differential 59% neutrophils, 12% monocytes, 4% lymphocytes, 1  percentage of eosinophils and basophils, 6% band neutrophils, 12% myelocytes, 1% promyelocytes, 29% blasts. - Pathologist review of blood smear reported as leukoerythroblastic reaction. - 25 pound weight loss in the last 6 months, unintentional.  Decreased appetite.  Reports fatigue for the last few months.  Reports night sweats x3 in the last 1 month. - Bone marrow biopsy on 11/18/2020 consistent with granulocytic proliferation with differential including CMML versus myeloproliferative disorder. - BCR/ABL was negative. 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 - NGS myeloid panel shows mutations in ASXL1, MPL, TET2, EZH2 mutations. - Spleen ultrasound on 12/16/2020 shows mildly enlarged measuring 12.4 x 9.6 x 7 cm with volume of 435 cc. - PDGFR alpha, beta and FGFR 1 was negative. - She was evaluated by Dr. Florene Glen at [facility].  Slides were reviewed at [facility] hematopathology.  They thought it was less likely CMML and more likely MPL positive myeloproliferative neoplasm.  Dr. Florene Glen has recommended initiate Jakafi. - Given anemia, B symptoms, cytogenetics with MPL, ASLX1, normal karyotype she is intermediate risk on GIPPS at high risk [id number]+ - Ruxolitinib 10 mg twice daily started on 02/16/2021.  Dose increased to 15 mg twice daily on 05/18/2021. - CTAP on 04/07/2021: Hepatomegaly and splenomegaly measuring 14.3 cm.  No other pathology.  No spleen infarcts. -Ruxolitinib dose increased to 15 mg twice daily on 09/20/2021. - Ultrasound spleen (09/21/2021): 13.6 cm with volume 532 mL.  This is slightly improved from previous volume. - Bone marrow biopsy (09/09/2021): Material is limited but overall features consistent with myeloid neoplasm.  No increase in blasts.  Erythroid precursors appear decreased but megakaryocytes are variably evident with clusters and small forms.  Reticulin's shows variable increase in reticulin fibers including areas with prominent increase.   Chromosome analysis was normal. -  Tapering doses of ruxolitinib started on 12/24/2021, last dose on 01/23/2022 - Evaluated by Dr. Florene Glen on 12/24/2021. - Momelotinib 100 mg daily started on 01/26/2022  2.  Social/family history: - She lives at home and is able to do all her ADLs and IADLs although she is getting tired lately.  She reports quitting smoking 6 months ago and smoked 1 pack/week for 43 years. - She believes that her mother had some kind of leukemia.  No other malignancies.   PLAN:  MPL positive myeloproliferative neoplasm: - Recent hospitalization from 02/08/2022 through 02/10/2022 with weakness and decreased p.o. intake.  Found to have UTI. - Restarted momelotinib on 02/11/2022 after brief holding during hospitalization.  Overall tolerating it reasonably well. - Reviewed labs which showed white count 113 and hemoglobin 7.7.  Platelet count is 190.  Creatinine has come down to baseline of 1.74. - She will receive 1 unit PRBC today.  RTC 2 weeks for follow-up.  Continue momelotinib at the same dose.  2.  Decreased appetite: - Weight is stable since last visit.  3.  CKD: - Creatinine is 1.7.  Baseline creatinine 1.3-1.5. - Recommend increase fluid intake to 70 ounces per day.  4.  Hypokalemia: - Continue potassium supplements.  Potassium is 4.1.  5.  Generalized itching: - Generalized itching is slightly better since momelotinib started.    Orders placed this encounter:  No orders of the defined types were placed in this encounter.      Derek Jack, MD [place] [phone]

## 2022-02-16 NOTE — Telephone Encounter (Signed)
Renee Waters, [phone]   Wants verbal orders to approve PT plan of care?

## 2022-02-17 ENCOUNTER — Telehealth: Payer: Self-pay | Admitting: Family Medicine

## 2022-02-17 DIAGNOSIS — C946 Myelodysplastic disease, not classified: Secondary | ICD-10-CM | POA: Diagnosis not present

## 2022-02-17 DIAGNOSIS — G709 Myoneural disorder, unspecified: Secondary | ICD-10-CM | POA: Diagnosis not present

## 2022-02-17 DIAGNOSIS — M329 Systemic lupus erythematosus, unspecified: Secondary | ICD-10-CM | POA: Diagnosis not present

## 2022-02-17 DIAGNOSIS — N179 Acute kidney failure, unspecified: Secondary | ICD-10-CM | POA: Diagnosis not present

## 2022-02-17 DIAGNOSIS — I129 Hypertensive chronic kidney disease with stage 1 through stage 4 chronic kidney disease, or unspecified chronic kidney disease: Secondary | ICD-10-CM | POA: Diagnosis not present

## 2022-02-17 DIAGNOSIS — N390 Urinary tract infection, site not specified: Secondary | ICD-10-CM | POA: Diagnosis not present

## 2022-02-17 LAB — CULTURE, BLOOD (ROUTINE X 2)
Culture: NO GROWTH
Special Requests: ADEQUATE

## 2022-02-17 NOTE — Telephone Encounter (Signed)
Renee Waters w. [facility] Called in on patient behalf.  Verbl orders for skilled nursing   1 week 7   Call back # [phone]  Can leave on voicemail. Please leave callers last name

## 2022-02-17 NOTE — Telephone Encounter (Signed)
Verbal ok given.

## 2022-02-18 ENCOUNTER — Inpatient Hospital Stay: Payer: Medicare Other | Admitting: Family Medicine

## 2022-02-19 LAB — TYPE AND SCREEN
ABO/RH(D): O POS
Antibody Screen: POSITIVE
DAT, IgG: NEGATIVE
Donor AG Type: NEGATIVE
Donor AG Type: NEGATIVE
Unit division: 0
Unit division: 0

## 2022-02-19 LAB — BPAM RBC
Blood Product Expiration Date: 202402172359
ISSUE DATE / TIME: 202401101046
Unit Type and Rh: 5100

## 2022-02-21 ENCOUNTER — Telehealth: Payer: Self-pay | Admitting: Dietician

## 2022-02-21 ENCOUNTER — Inpatient Hospital Stay: Payer: Medicare Other | Admitting: Dietician

## 2022-02-21 NOTE — Progress Notes (Signed)
See telephone note

## 2022-02-21 NOTE — Telephone Encounter (Signed)
Nutrition Assessment   Reason for Assessment: MST   ASSESSMENT: 78 year old female with MPL positive myeloproliferative neoplasm. She is currently receiving momelotinib 100 mg daily. Patient is under the care of [name].   Past medical history includes anemia, arthritis, Barrett's esophagus, CKD stage 3, genital herpes, GERD, HTN, lupus, osteoarthritis, HTN  1/2-1/4 hospital admission secondary to UTI  Spoke with patient via telephone. She reports eating better. Patient eats breakfast and dinner meals. Sometimes will snack in between and late in the evening. Breakfast is usually egg on croissant, fruit,  oatmeal topped with cranberries, or grilled cheese, glass of cranberry juice and water. Last night pt had baked chicken, rutabaga, and stuffing. Recently pt recalls craving ice pops and saltines. In the evenings pt recalls snacks including cookies, cakes, and pies. Patient is drinking 70 ounces of water. Every now and then she has nauseas. Nausea associated with food. Pt states it is dependent upon on smell and look/size of plate. She denies constipation or diarrhea.    Nutrition Focused Physical Exam: deferred - telephone visit   Medications: acyclovir, buspar, celexa, atarax, lasix 20 mg, remeron, lopressor, MVI, oxycodone, protonix, compazine, klor-con, restoril, zanaflex   Labs: 1/10 - BUN 29, Cr 1.74, Ca 8.6   Anthropometrics: Weights decreased 5% (7 lb) in one week. Per chart pt 131 lb 6.3 oz on 02/09/22 (hospitalized)  Height: 5'5" Weight: 124 lb 9.6 oz (1/10) UBW: 130-133 lb  BMI: 20.73   NUTRITION DIAGNOSIS: Unintentional weight loss related to cancer and acute illness as evidenced by 5% wt loss in 7 days - severe for time frame   INTERVENTION:  Encouraged smaller more frequent meals/snacks with adequate calories/protein Discussed foods with protein - encouraged to include protein source with meals/snacks Encouraged bedtime snack Suggested high protein snack  following HHPT to promote muscle repair/growth Discussed strategies for nausea, foods to limit and foods best tolerated Contact information provided Will mail handouts - pt confirmed address   MONITORING, EVALUATION, GOAL: Pt will tolerate increased calories and protein to minimize further weight loss    Next Visit: To be scheduled as needed

## 2022-02-22 ENCOUNTER — Other Ambulatory Visit: Payer: Self-pay

## 2022-02-22 ENCOUNTER — Telehealth: Payer: Self-pay

## 2022-02-22 ENCOUNTER — Encounter: Payer: Self-pay | Admitting: Family Medicine

## 2022-02-22 ENCOUNTER — Ambulatory Visit (INDEPENDENT_AMBULATORY_CARE_PROVIDER_SITE_OTHER): Payer: Medicare Other | Admitting: Family Medicine

## 2022-02-22 ENCOUNTER — Other Ambulatory Visit (HOSPITAL_COMMUNITY): Payer: Self-pay

## 2022-02-22 ENCOUNTER — Ambulatory Visit: Payer: Medicare Other | Admitting: Internal Medicine

## 2022-02-22 VITALS — BP 167/70 | HR 69 | Ht 65.0 in | Wt 126.1 lb

## 2022-02-22 DIAGNOSIS — M549 Dorsalgia, unspecified: Secondary | ICD-10-CM

## 2022-02-22 DIAGNOSIS — F419 Anxiety disorder, unspecified: Secondary | ICD-10-CM

## 2022-02-22 DIAGNOSIS — G4700 Insomnia, unspecified: Secondary | ICD-10-CM

## 2022-02-22 DIAGNOSIS — M329 Systemic lupus erythematosus, unspecified: Secondary | ICD-10-CM

## 2022-02-22 DIAGNOSIS — D471 Chronic myeloproliferative disease: Secondary | ICD-10-CM

## 2022-02-22 DIAGNOSIS — Z87891 Personal history of nicotine dependence: Secondary | ICD-10-CM

## 2022-02-22 DIAGNOSIS — K2270 Barrett's esophagus without dysplasia: Secondary | ICD-10-CM

## 2022-02-22 DIAGNOSIS — M1990 Unspecified osteoarthritis, unspecified site: Secondary | ICD-10-CM

## 2022-02-22 DIAGNOSIS — G709 Myoneural disorder, unspecified: Secondary | ICD-10-CM

## 2022-02-22 DIAGNOSIS — E860 Dehydration: Secondary | ICD-10-CM

## 2022-02-22 DIAGNOSIS — N179 Acute kidney failure, unspecified: Secondary | ICD-10-CM

## 2022-02-22 DIAGNOSIS — K219 Gastro-esophageal reflux disease without esophagitis: Secondary | ICD-10-CM

## 2022-02-22 DIAGNOSIS — E875 Hyperkalemia: Secondary | ICD-10-CM

## 2022-02-22 DIAGNOSIS — Z7689 Persons encountering health services in other specified circumstances: Secondary | ICD-10-CM

## 2022-02-22 DIAGNOSIS — F32A Depression, unspecified: Secondary | ICD-10-CM

## 2022-02-22 DIAGNOSIS — N183 Chronic kidney disease, stage 3 unspecified: Secondary | ICD-10-CM

## 2022-02-22 DIAGNOSIS — G8929 Other chronic pain: Secondary | ICD-10-CM

## 2022-02-22 DIAGNOSIS — R32 Unspecified urinary incontinence: Secondary | ICD-10-CM

## 2022-02-22 DIAGNOSIS — I129 Hypertensive chronic kidney disease with stage 1 through stage 4 chronic kidney disease, or unspecified chronic kidney disease: Secondary | ICD-10-CM | POA: Diagnosis not present

## 2022-02-22 DIAGNOSIS — D696 Thrombocytopenia, unspecified: Secondary | ICD-10-CM

## 2022-02-22 DIAGNOSIS — R39198 Other difficulties with micturition: Secondary | ICD-10-CM

## 2022-02-22 DIAGNOSIS — D631 Anemia in chronic kidney disease: Secondary | ICD-10-CM | POA: Diagnosis not present

## 2022-02-22 DIAGNOSIS — M542 Cervicalgia: Secondary | ICD-10-CM | POA: Diagnosis not present

## 2022-02-22 DIAGNOSIS — R7401 Elevation of levels of liver transaminase levels: Secondary | ICD-10-CM

## 2022-02-22 DIAGNOSIS — C946 Myelodysplastic disease, not classified: Secondary | ICD-10-CM

## 2022-02-22 DIAGNOSIS — Z79891 Long term (current) use of opiate analgesic: Secondary | ICD-10-CM

## 2022-02-22 DIAGNOSIS — N390 Urinary tract infection, site not specified: Secondary | ICD-10-CM

## 2022-02-22 DIAGNOSIS — R3912 Poor urinary stream: Secondary | ICD-10-CM | POA: Diagnosis not present

## 2022-02-22 DIAGNOSIS — M519 Unspecified thoracic, thoracolumbar and lumbosacral intervertebral disc disorder: Secondary | ICD-10-CM

## 2022-02-22 DIAGNOSIS — R0989 Other specified symptoms and signs involving the circulatory and respiratory systems: Secondary | ICD-10-CM | POA: Diagnosis not present

## 2022-02-22 MED ORDER — MOMELOTINIB DIHYDROCHLORIDE 100 MG PO TABS
100.0000 mg | ORAL_TABLET | Freq: Every day | ORAL | 6 refills | Status: DC
  Filled 2022-02-22: qty 30, 30d supply, fill #0
  Filled 2022-02-22: qty 30, fill #0
  Filled 2022-02-22: qty 30, 30d supply, fill #0
  Filled 2022-02-25: qty 30, fill #0
  Filled 2022-02-28: qty 30, 30d supply, fill #0
  Filled 2022-03-23: qty 30, 30d supply, fill #1
  Filled 2022-04-18: qty 30, 30d supply, fill #2
  Filled 2022-05-18: qty 30, 30d supply, fill #3
  Filled 2022-06-21: qty 30, 30d supply, fill #4

## 2022-02-22 NOTE — Progress Notes (Signed)
   Renee Waters     MRN: [id number]      DOB: 06/26/1945   HPI Renee Waters is here for f/u of recent hospitalization from 01/02 to 02/10/2022 due to AKI superimposed on stage 5 CKD, and dehydration Hospital course reviewed, and questions answered. Specific issues needed  to be followed up are addressed Still needs oxygen removed from her home, none is being delivered C/o poor urinary stream and incontinence for at least 6 months, getting worse and has recurrent UTI Requesting assistance with housekeeping and some ADL's will refer to hospice ROS Denies recent fever or chills. Denies sinus pressure, nasal congestion, ear pain or sore throat. Denies chest congestion, productive cough or wheezing. Denies chest pains, palpitations and leg swelling Chronic  joint pain, swelling and limitation in mobility. . Chronic  depression, anxiety and  insomnia. Denies skin break down or rash.   PE BP (!) 167/70   Pulse 69   Ht '5\' 5"'$  (1.651 m)   Wt 126 lb 1.3 oz (57.2 kg)   SpO2 92%   BMI 20.98 kg/m    Patient alert and oriented and in no cardiopulmonary distress.  HEENT: No facial asymmetry, EOMI,     Neck supple .  Chest: Clear to auscultation bilaterally.Decreased air entry  CVS: S1, S2 no murmurs, no S3.Regular rate.  ABD: Soft non tender.   Ext: No edema  MS: decreased  ROM spine, shoulders, hips and knees.  Skin: Intact, no ulcerations or rash noted.  Psych: Good eye contact, normal affect. Memory intact not anxious or depressed appearing.  CNS: CN 2-12 intact, power,  normal throughout.no focal deficits noted.   Assessment & Plan  Acute-on-chronic kidney injury ([place]) Improved at time of discharge and rept labs as out pt show continued improvement, needs to commit to adequate hydration  Encounter for support and coordination of transition of care Patient in for follow up of recent hospitalization. Discharge summary, and laboratory and radiology data are reviewed, and any  questions or concerns  are discussed. Specific issues requiring follow up are specifically addressed.   Hyperkalemia Resolved, out pt lab normal  Labile hypertension Adequate control though not at goal, no med change, reports better numbers at home avg in the 140's  Poor urinary stream Over 6 month history and worsening refer Urology  Difficulty voiding Progressively worsening, may be due to polypharmacy, urology to address  Urinary incontinence Urology to assess

## 2022-02-22 NOTE — Patient Instructions (Addendum)
Change 01/25 follow up to 2nd week I March [please, call if you eed me sooner  Nurse please send d/c order for oxygen to supplier, they need to remove tanks at her home [facility])   Nurse please request from Hospice in home help , daughter to discuss needs with them  You are referred to Urology re poor urine stream and difficulty voiding  Please use ALL resources available, emotional, spiritual , pheysical to support you and your entire family  Labs were repeated on Jan 10, and liver and kidney looked better and you just had a successful blood transfusion  No labs needed today  Thankful you had a wonderful Christmas and that your family is walking closely with you through this with love  Thanks for choosing [facility], we consider it a privelige to serve you.

## 2022-02-22 NOTE — Telephone Encounter (Signed)
Oral Oncology Patient Advocate Encounter   Was successful in securing patient a $7,000 grant from [employer] to provide copayment coverage for [place].  This will keep the out of pocket expense at $0.     I have spoken with the patient.    The billing information is as follows and has been shared with [place].   Member ID: [id number] Group ID: [facility] RxBin: [id number] PCN: [id number] Dates of Eligibility: 02/22/22 through 02/23/23  Fund name:  Myeloproliferative Neoplasms.   [name], [facility] Patient [place]  [phone] (phone) [phone] (fax) 02/22/2022 1:01 PM

## 2022-02-23 ENCOUNTER — Other Ambulatory Visit (HOSPITAL_COMMUNITY): Payer: Self-pay

## 2022-02-23 DIAGNOSIS — D47Z9 Other specified neoplasms of uncertain behavior of lymphoid, hematopoietic and related tissue: Secondary | ICD-10-CM | POA: Diagnosis not present

## 2022-02-23 DIAGNOSIS — Z515 Encounter for palliative care: Secondary | ICD-10-CM | POA: Diagnosis not present

## 2022-02-25 ENCOUNTER — Other Ambulatory Visit (HOSPITAL_COMMUNITY): Payer: Self-pay

## 2022-02-25 DIAGNOSIS — N179 Acute kidney failure, unspecified: Secondary | ICD-10-CM | POA: Diagnosis not present

## 2022-02-25 DIAGNOSIS — G709 Myoneural disorder, unspecified: Secondary | ICD-10-CM | POA: Diagnosis not present

## 2022-02-25 DIAGNOSIS — I129 Hypertensive chronic kidney disease with stage 1 through stage 4 chronic kidney disease, or unspecified chronic kidney disease: Secondary | ICD-10-CM | POA: Diagnosis not present

## 2022-02-25 DIAGNOSIS — N390 Urinary tract infection, site not specified: Secondary | ICD-10-CM | POA: Diagnosis not present

## 2022-02-25 DIAGNOSIS — M329 Systemic lupus erythematosus, unspecified: Secondary | ICD-10-CM | POA: Diagnosis not present

## 2022-02-25 DIAGNOSIS — C946 Myelodysplastic disease, not classified: Secondary | ICD-10-CM | POA: Diagnosis not present

## 2022-02-28 ENCOUNTER — Other Ambulatory Visit: Payer: Self-pay

## 2022-02-28 ENCOUNTER — Other Ambulatory Visit (HOSPITAL_COMMUNITY): Payer: Self-pay

## 2022-02-28 ENCOUNTER — Encounter: Payer: Self-pay | Admitting: Family Medicine

## 2022-02-28 DIAGNOSIS — R39198 Other difficulties with micturition: Secondary | ICD-10-CM | POA: Insufficient documentation

## 2022-02-28 DIAGNOSIS — R32 Unspecified urinary incontinence: Secondary | ICD-10-CM | POA: Insufficient documentation

## 2022-02-28 NOTE — Assessment & Plan Note (Signed)
Progressively worsening, may be due to polypharmacy, urology to address

## 2022-02-28 NOTE — Assessment & Plan Note (Signed)
Over 6 month history and worsening refer Urology

## 2022-02-28 NOTE — Assessment & Plan Note (Signed)
Resolved, out pt lab normal

## 2022-02-28 NOTE — Assessment & Plan Note (Signed)
Adequate control though not at goal, no med change, reports better numbers at home avg in the 140's

## 2022-02-28 NOTE — Assessment & Plan Note (Signed)
Improved at time of discharge and rept labs as out pt show continued improvement, needs to commit to adequate hydration

## 2022-02-28 NOTE — Assessment & Plan Note (Signed)
Patient in for follow up of recent hospitalization. Discharge summary, and laboratory and radiology data are reviewed, and any questions or concerns  are discussed. Specific issues requiring follow up are specifically addressed.  

## 2022-02-28 NOTE — Assessment & Plan Note (Signed)
Urology to assess

## 2022-03-01 ENCOUNTER — Other Ambulatory Visit (HOSPITAL_COMMUNITY): Payer: Self-pay

## 2022-03-01 ENCOUNTER — Other Ambulatory Visit: Payer: Self-pay

## 2022-03-01 DIAGNOSIS — M329 Systemic lupus erythematosus, unspecified: Secondary | ICD-10-CM | POA: Diagnosis not present

## 2022-03-01 DIAGNOSIS — N390 Urinary tract infection, site not specified: Secondary | ICD-10-CM | POA: Diagnosis not present

## 2022-03-01 DIAGNOSIS — C946 Myelodysplastic disease, not classified: Secondary | ICD-10-CM | POA: Diagnosis not present

## 2022-03-01 DIAGNOSIS — G709 Myoneural disorder, unspecified: Secondary | ICD-10-CM | POA: Diagnosis not present

## 2022-03-01 DIAGNOSIS — N179 Acute kidney failure, unspecified: Secondary | ICD-10-CM | POA: Diagnosis not present

## 2022-03-01 DIAGNOSIS — I129 Hypertensive chronic kidney disease with stage 1 through stage 4 chronic kidney disease, or unspecified chronic kidney disease: Secondary | ICD-10-CM | POA: Diagnosis not present

## 2022-03-01 MED ORDER — ONDANSETRON 8 MG PO TBDP: 8.0000 mg | ORAL_TABLET | Freq: Three times a day (TID) | ORAL | 0 refills | Status: DC | PRN

## 2022-03-02 ENCOUNTER — Inpatient Hospital Stay: Payer: Medicare Other

## 2022-03-02 ENCOUNTER — Other Ambulatory Visit (HOSPITAL_COMMUNITY): Payer: Self-pay

## 2022-03-02 ENCOUNTER — Inpatient Hospital Stay (HOSPITAL_BASED_OUTPATIENT_CLINIC_OR_DEPARTMENT_OTHER): Payer: Medicare Other | Admitting: Hematology

## 2022-03-02 DIAGNOSIS — Z87891 Personal history of nicotine dependence: Secondary | ICD-10-CM | POA: Diagnosis not present

## 2022-03-02 DIAGNOSIS — N183 Chronic kidney disease, stage 3 unspecified: Secondary | ICD-10-CM | POA: Diagnosis not present

## 2022-03-02 DIAGNOSIS — D471 Chronic myeloproliferative disease: Secondary | ICD-10-CM

## 2022-03-02 DIAGNOSIS — E876 Hypokalemia: Secondary | ICD-10-CM | POA: Diagnosis not present

## 2022-03-02 DIAGNOSIS — L299 Pruritus, unspecified: Secondary | ICD-10-CM | POA: Diagnosis not present

## 2022-03-02 DIAGNOSIS — I129 Hypertensive chronic kidney disease with stage 1 through stage 4 chronic kidney disease, or unspecified chronic kidney disease: Secondary | ICD-10-CM | POA: Diagnosis not present

## 2022-03-02 LAB — CBC WITH DIFFERENTIAL/PLATELET
Abs Immature Granulocytes: 3.4 10*3/uL — ABNORMAL HIGH (ref 0.00–0.07)
Band Neutrophils: 13 %
Basophils Absolute: 0 10*3/uL (ref 0.0–0.1)
Basophils Relative: 0 %
Blasts: 1 %
Eosinophils Absolute: 0 10*3/uL (ref 0.0–0.5)
Eosinophils Relative: 0 %
HCT: 32.8 % — ABNORMAL LOW (ref 36.0–46.0)
Hemoglobin: 10.5 g/dL — ABNORMAL LOW (ref 12.0–15.0)
Lymphocytes Relative: 22 %
Lymphs Abs: 18.9 10*3/uL — ABNORMAL HIGH (ref 0.7–4.0)
MCH: 30.2 pg (ref 26.0–34.0)
MCHC: 32 g/dL (ref 30.0–36.0)
MCV: 94.3 fL (ref 80.0–100.0)
Metamyelocytes Relative: 2 %
Monocytes Absolute: 4.3 10*3/uL — ABNORMAL HIGH (ref 0.1–1.0)
Monocytes Relative: 5 %
Myelocytes: 2 %
Neutro Abs: 58.3 10*3/uL — ABNORMAL HIGH (ref 1.7–7.7)
Neutrophils Relative %: 55 %
Platelets: 169 10*3/uL (ref 150–400)
RBC: 3.48 MIL/uL — ABNORMAL LOW (ref 3.87–5.11)
RDW: 22.5 % — ABNORMAL HIGH (ref 11.5–15.5)
WBC: 85.8 10*3/uL (ref 4.0–10.5)
nRBC: 4 % — ABNORMAL HIGH (ref 0.0–0.2)

## 2022-03-02 LAB — COMPREHENSIVE METABOLIC PANEL
ALT: 26 U/L (ref 0–44)
AST: 42 U/L — ABNORMAL HIGH (ref 15–41)
Albumin: 4.1 g/dL (ref 3.5–5.0)
Alkaline Phosphatase: 189 U/L — ABNORMAL HIGH (ref 38–126)
Anion gap: 8 (ref 5–15)
BUN: 33 mg/dL — ABNORMAL HIGH (ref 8–23)
CO2: 24 mmol/L (ref 22–32)
Calcium: 8.8 mg/dL — ABNORMAL LOW (ref 8.9–10.3)
Chloride: 105 mmol/L (ref 98–111)
Creatinine, Ser: 1.82 mg/dL — ABNORMAL HIGH (ref 0.44–1.00)
GFR, Estimated: 28 mL/min — ABNORMAL LOW (ref >60)
Glucose, Bld: 113 mg/dL — ABNORMAL HIGH (ref 70–99)
Potassium: 3.8 mmol/L (ref 3.5–5.1)
Sodium: 137 mmol/L (ref 135–145)
Total Bilirubin: 1.1 mg/dL (ref 0.3–1.2)
Total Protein: 9 g/dL — ABNORMAL HIGH (ref 6.5–8.1)

## 2022-03-02 LAB — SAMPLE TO BLOOD BANK

## 2022-03-02 LAB — LACTATE DEHYDROGENASE: LDH: 939 U/L — ABNORMAL HIGH (ref 98–192)

## 2022-03-02 LAB — MAGNESIUM: Magnesium: 1.9 mg/dL (ref 1.7–2.4)

## 2022-03-02 MED ORDER — SODIUM CHLORIDE 0.9% FLUSH
10.0000 mL | Freq: Once | INTRAVENOUS | Status: AC
  Administered 2022-03-02: 10 mL via INTRAVENOUS

## 2022-03-02 MED ORDER — HEPARIN SOD (PORK) LOCK FLUSH 100 UNIT/ML IV SOLN
500.0000 [IU] | Freq: Once | INTRAVENOUS | Status: AC
  Administered 2022-03-02: 500 [IU] via INTRAVENOUS

## 2022-03-02 NOTE — Progress Notes (Signed)
[address],  [id number]   CLINIC:  Medical Oncology/Hematology  PCP:  [name], MD [address], [place] / [place] [address] [phone]   REASON FOR VISIT:  Follow-up for MPL positive myeloproliferative neoplasm  PRIOR THERAPY: Jakafi 10 mg twice daily  NGS Results: not done  CURRENT THERAPY: Momelotinib 100 mg daily  INTERVAL HISTORY:  [name], a 77 y.o. female, seen for follow-up of MPL positive to myeloproliferative neoplasm.  She has been out of momelotinib for the last 3 days.  She reported nausea which started about 3 to 4 days ago and vomited once yesterday.  She has been taking Zofran since then which is helping.  She is eating 2 meals per day.  She lost about 2 to 3 pounds since last visit.  REVIEW OF SYSTEMS:  Review of Systems  Gastrointestinal:  Positive for nausea and vomiting.  Musculoskeletal:  Positive for arthralgias (Hip, back and legs stable).  Psychiatric/Behavioral:  The patient is nervous/anxious.   All other systems reviewed and are negative.   PAST MEDICAL/SURGICAL HISTORY:  Past Medical History:  Diagnosis Date   Acute cholangitis    Allergy    Anemia    Anxiety    Arthritis    Phreesia 03/18/2020   Barrett's esophagus    Cataract    Chronic back pain    Chronic neck pain    CKD (chronic kidney disease) stage 3, GFR 30-59 ml/min ([place]) 10/29/2020   Depression    Genital herpes    GERD (gastroesophageal reflux disease)    H/O degenerative disc disease    History of blood transfusion    Hypertension    Hypoxia 12/01/2021   Insomnia    Lupus (systemic lupus erythematosus) ([place])    Neuromuscular disorder ([place])    Osteoarthritis    S/P colonoscopy 07/2003   normal, no polyps   S/P endoscopy June 2005, Oct 2009   2005: short-segment Barrett's, 2009: short-segment Barrett's   Upper respiratory tract infection 07/09/2020   UTI (lower urinary tract infection) 11/2012    Past Surgical History:  Procedure Laterality Date   ABDOMINAL HYSTERECTOMY     BACK SURGERY     BIOPSY N/A 03/20/2014   Procedure: BIOPSY;  Surgeon: Daneil Dolin, MD;  Location: [facility];  Service: Endoscopy;  Laterality: N/A;   BIOPSY  09/14/2015   Procedure: BIOPSY;  Surgeon: Daneil Dolin, MD;  Location: [facility];  Service: Endoscopy;;  esophageal and gastric   BIOPSY  07/23/2019   Procedure: BIOPSY;  Surgeon: Daneil Dolin, MD;  Location: [facility];  Service: Endoscopy;;  esophageal    CARPAL TUNNEL RELEASE Left 2013   cervical disectomy  2002   CESAREAN SECTION N/A    Phreesia 03/18/2020   CHOLECYSTECTOMY     with lysis of adhesions for sbo; "ruptured gallbladder".   COLONOSCOPY  11/09/2011   RMR: Melanosis coli   COLONOSCOPY WITH PROPOFOL N/A 09/14/2015   Dr. Gala Romney: diverticulosis, 2m TA removed. next TCS 09/2020.    COLONOSCOPY WITH PROPOFOL N/A 04/30/2020   Procedure: COLONOSCOPY WITH PROPOFOL;  Surgeon: RDaneil Dolin MD;  [facility];  Service: Endoscopy;  Laterality: N/A;  PM (ASA 3)   DENTAL SURGERY  11/2015   multiple tooth extraction   ESOPHAGOGASTRODUODENOSCOPY  11/29/2007   salmon-colored  tongue   longest stable at  3 cm, distal esophagus as described previously status post biopsy/ Hiatal  hernia, otherwise normal stomach D1 and D2   ESOPHAGOGASTRODUODENOSCOPY  01/06/11   short segment Barrett's esophagus s/p bx/Hiatal hernia   ESOPHAGOGASTRODUODENOSCOPY (EGD) WITH PROPOFOL N/A 03/20/2014   LNL:GXQJJHER distal esophagus short segment barrett's, bx with no dysplasia. next egd in 03/2017   ESOPHAGOGASTRODUODENOSCOPY (EGD) WITH PROPOFOL N/A 09/14/2015   Dr. Gala Romney: Barrett's without dysplasia, gastritis benign bx, hiatal hernia. next EGD 09/2018.   ESOPHAGOGASTRODUODENOSCOPY (EGD) WITH PROPOFOL N/A 07/23/2019   Procedure: ESOPHAGOGASTRODUODENOSCOPY (EGD) WITH PROPOFOL;  Surgeon: Daneil Dolin, MD;  [facility];  Service: Endoscopy;   Laterality: N/A;  3:00pm   EYE SURGERY N/A    Phreesia 03/18/2020   HERNIA REPAIR Right 07/2010   Dr. Zada Girt   IR IMAGING GUIDED PORT INSERTION  02/09/2021   JOINT REPLACEMENT     LAPAROSCOPIC CHOLECYSTECTOMY  2017   at [facility]   POLYPECTOMY  09/14/2015   Procedure: POLYPECTOMY;  Surgeon: Daneil Dolin, MD;  Location[facility];  Service: Endoscopy;;  ascending colon   right hip replacement  07/2010   went back in sept 2012 to fix   SHOULDER ARTHROSCOPY  2008   left   SPINE SURGERY N/A    Phreesia 03/18/2020   TOTAL HIP REVISION Right 12/17/2012   Procedure: RIGHT TOTAL HIP REVISION;  Surgeon: Mauri Pole, MD;  Location: [facility];  Service: Orthopedics;  Laterality: Right;   WRIST SURGERY Right 2011   open reduction right wrist.    SOCIAL HISTORY:  Social History   Socioeconomic History   Marital status: Married    Spouse name: louis   Number of children: 4   Years of education: 12+   Highest education level: Some college, no degree  Occupational History   Occupation: Press photographer  - retired   Occupation: non profit  Tobacco Use   Smoking status: Former    Packs/day: 0.25    Years: 25.00    Total pack years: 6.25    Types: Cigarettes    Quit date: 02/07/2003    Years since quitting: 19.0   Smokeless tobacco: Never   Tobacco comments:    quit in 2004  Vaping Use   Vaping Use: Never used  Substance and Sexual Activity   Alcohol use: No   Drug use: No   Sexual activity: Not Currently    Birth control/protection: Surgical  Other Topics Concern   Not on file  Social History Narrative   Not on file   Social Determinants of Health   Financial Resource Strain: Low Risk  (12/28/2020)   Overall Financial Resource Strain (CARDIA)    Difficulty of Paying Living Expenses: Not hard at all  Food Insecurity: No Food Insecurity (02/08/2022)   Hunger Vital Sign    Worried About Running Out of Food in the Last Year: Never true    [place] in the Last Year: Never  true  Transportation Needs: No Transportation Needs (02/11/2022)   PRAPARE - Hydrologist (Medical): No    Lack of Transportation (Non-Medical): No  Physical Activity: Inactive (04/23/2021)   Exercise Vital Sign    Days of Exercise per Week: 0 days    Minutes of Exercise per Session: 0 min  Stress: Stress Concern Present (04/23/2021)   [place]    Feeling of Stress : Rather much  Social Connections: Moderately Integrated (12/28/2020)   Social Connection and Isolation Panel [NHANES]    Frequency of  Communication with Friends and Family: More than three times a week    Frequency of Social Gatherings with Friends and Family: Once a week    Attends Religious Services: More than 4 times per year    Active Member of [employer] or Organizations: No    Attends Archivist Meetings: Never    Marital Status: Married  Human resources officer Violence: Not At Risk (02/08/2022)   Humiliation, Afraid, Rape, and Kick questionnaire    Fear of Current or Ex-Partner: No    Emotionally Abused: No    Physically Abused: No    Sexually Abused: No    FAMILY HISTORY:  Family History  Problem Relation Age of Onset   Hypertension Mother    Stroke Mother    Colon cancer Neg Hx    Anesthesia problems Neg Hx    Hypotension Neg Hx    Malignant hyperthermia Neg Hx    Pseudochol deficiency Neg Hx    Gastric cancer Neg Hx    Esophageal cancer Neg Hx     CURRENT MEDICATIONS:  Current Outpatient Medications  Medication Sig Dispense Refill   acyclovir (ZOVIRAX) 800 MG tablet Take 1 tablet (800 mg total) by mouth 5 (five) times daily as needed. 50 tablet 1   acyclovir ointment (ZOVIRAX) 5 % Apply 1 Application topically every 3 (three) hours as needed. 15 g 1   allopurinol 200 MG TABS Take 200 mg by mouth daily. 30 tablet 0   busPIRone (BUSPAR) 5 MG tablet Take 1 tablet (5 mg total) by mouth daily as needed (anxiety).  Take one tablet by mouth once daily, as needed, for anxiety     cetirizine (ZYRTEC) 10 MG tablet Take 10 mg by mouth daily.     citalopram (CELEXA) 20 MG tablet Take 1 tablet by mouth once daily 30 tablet 0   EPINEPHrine 0.3 mg/0.3 mL IJ SOAJ injection INJECT 0.3 MLS INTO MUSCLE ONCE AS NEEDED 1 each 2   furosemide (LASIX) 20 MG tablet TAKE 1 TABLET BY MOUTH ONCE DAILY AS NEEDED 30 tablet 3   hydrOXYzine (ATARAX) 25 MG tablet Take 12.5-25 mg by mouth 2 (two) times daily as needed.     metoprolol tartrate (LOPRESSOR) 25 MG tablet Take one and a half tablets twice daily 180 tablet 3   mirtazapine (REMERON) 15 MG tablet TAKE 1 TABLET BY MOUTH AT BEDTIME 30 tablet 0   Momelotinib Dihydrochloride (OJJAARA) 100 MG TABS Take 100 mg by mouth daily.     Momelotinib Dihydrochloride 100 MG TABS Take 1 tablet (100 mg) by mouth daily. 30 tablet 6   Multiple Vitamin (MULTIVITAMIN WITH MINERALS) TABS tablet Take 1 tablet by mouth daily.     ondansetron (ZOFRAN-ODT) 8 MG disintegrating tablet Take 1 tablet (8 mg total) by mouth every 8 (eight) hours as needed for nausea or vomiting. 20 tablet 0   oxyCODONE (ROXICODONE) 15 MG immediate release tablet Take 15 mg by mouth every 6 (six) hours as needed.     pantoprazole (PROTONIX) 40 MG tablet Take 1 tablet (40 mg total) by mouth 2 (two) times daily before a meal. 60 tablet 0   potassium chloride (KLOR-CON) 10 MEQ tablet Take 1 tablet by mouth once daily 30 tablet 4   prochlorperazine (COMPAZINE) 10 MG tablet Take 1 tablet (10 mg total) by mouth every 6 (six) hours as needed for nausea or vomiting. 60 tablet 1   temazepam (RESTORIL) 30 MG capsule Take 1 capsule (30 mg total) by mouth at bedtime  as needed for sleep. 30 capsule 5   tiZANidine (ZANAFLEX) 4 MG tablet Take by mouth.     lidocaine-prilocaine (EMLA) cream Apply 1 Application topically as needed (Access to port and pain). (Patient not taking: Reported on 03/02/2022) 30 g 6   No current facility-administered  medications for this visit.   Facility-Administered Medications Ordered in Other Visits  Medication Dose Route Frequency Provider Last Rate Last Admin   lanreotide acetate (SOMATULINE DEPOT) 120 MG/0.5ML injection            lanreotide acetate (SOMATULINE DEPOT) 120 MG/0.5ML injection            lanreotide acetate (SOMATULINE DEPOT) 120 MG/0.5ML injection            octreotide (SANDOSTATIN LAR) 30 MG IM injection            octreotide (SANDOSTATIN LAR) 30 MG IM injection             ALLERGIES:  Allergies  Allergen Reactions   Bee Venom Swelling and Hives   Norvasc [Amlodipine] Other (See Comments)    Hair loss   Tylenol [Acetaminophen] Itching   Hydralazine Other (See Comments)    Hair loss   Red Dye Itching   Percocet [Oxycodone-Acetaminophen] Rash and Other (See Comments)    Pt states, "this gives her a rash, but at home she takes oxycodone for pain relief"    Tyloxapol Itching and Rash    PHYSICAL EXAM:  Performance status (ECOG): 1 - Symptomatic but completely ambulatory  There were no vitals filed for this visit.   Wt Readings from Last 3 Encounters:  03/02/22 123 lb 6.4 oz (56 kg)  02/22/22 126 lb 1.3 oz (57.2 kg)  02/16/22 124 lb 9.6 oz (56.5 kg)   Physical Exam Vitals reviewed.  Constitutional:      Appearance: Normal appearance.  Cardiovascular:     Rate and Rhythm: Normal rate and regular rhythm.     Pulses: Normal pulses.     Heart sounds: Normal heart sounds.  Pulmonary:     Effort: Pulmonary effort is normal.     Breath sounds: Normal breath sounds.  Abdominal:     Palpations: Abdomen is soft. There is hepatomegaly (palpable 3 fingerbreadths below costal margin) and splenomegaly (2 fingerbreadths below costal margin'). There is no mass.     Tenderness: There is no abdominal tenderness.  Musculoskeletal:     Right lower leg: 1+ Edema present.     Left lower leg: 1+ Edema present.  Neurological:     General: No focal deficit present.     Mental  Status: She is alert and oriented to person, place, and time.  Psychiatric:        Mood and Affect: Mood normal.        Behavior: Behavior normal.      LABORATORY DATA:  I have reviewed the labs as listed.     Latest Ref Rng & Units 03/02/2022    9:51 AM 02/15/2022   11:05 AM 02/09/2022    4:44 AM  CBC  WBC 4.0 - 10.5 K/uL 85.8  113.4  101.3   Hemoglobin 12.0 - 15.0 g/dL 10.5  7.7  7.0   Hematocrit 36.0 - 46.0 % 32.8  24.6  22.6   Platelets 150 - 400 K/uL 169  190  99       Latest Ref Rng & Units 03/02/2022    9:51 AM 02/16/2022   10:00 AM 02/09/2022    4:44 AM  CMP  Glucose 70 - 99 mg/dL 113  91  109   BUN 8 - 23 mg/dL 33  29  48   Creatinine 0.44 - 1.00 mg/dL 1.82  1.74  3.00   Sodium 135 - 145 mmol/L 137  139  138   Potassium 3.5 - 5.1 mmol/L 3.8  4.1  3.7   Chloride 98 - 111 mmol/L 105  107  107   CO2 22 - 32 mmol/L '24  24  23   '$ Calcium 8.9 - 10.3 mg/dL 8.8  8.6  8.2   Total Protein 6.5 - 8.1 g/dL 9.0  7.6  6.6   Total Bilirubin 0.3 - 1.2 mg/dL 1.1  0.8  0.8   Alkaline Phos 38 - 126 U/L 189  145  137   AST 15 - 41 U/L 42  31  41   ALT 0 - 44 U/L '26  16  19     '$ DIAGNOSTIC IMAGING:  I have independently reviewed the scans and discussed with the patient. DG Chest Port 1 View  Result Date: 02/08/2022 CLINICAL DATA:  Assesses suspected EXAM: PORTABLE CHEST 1 VIEW COMPARISON:  11/10/2021 FINDINGS: Right chest wall port with catheter tip overlying the SVC. Cardiac and mediastinal contours are within normal limits. No focal pulmonary opacity. No pleural effusion or pneumothorax. No acute osseous abnormality. IMPRESSION: No acute cardiopulmonary process. Electronically Signed   By: Merilyn Baba M.D.   On: 02/08/2022 18:25     ASSESSMENT:  MPL positive prefibrotic/early primary myelofibrosis: - CBC on 11/06/2020 with white count 75.6, differential 59% neutrophils, 12% monocytes, 4% lymphocytes, 1 percentage of eosinophils and basophils, 6% band neutrophils, 12% myelocytes, 1%  promyelocytes, 29% blasts. - Pathologist review of blood smear reported as leukoerythroblastic reaction. - 25 pound weight loss in the last 6 months, unintentional.  Decreased appetite.  Reports fatigue for the last few months.  Reports night sweats x3 in the last 1 month. - Bone marrow biopsy on 11/18/2020 consistent with granulocytic proliferation with differential including CMML versus myeloproliferative disorder. - BCR/ABL was negative. 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 - NGS myeloid panel shows mutations in ASXL1, MPL, TET2, EZH2 mutations. - Spleen ultrasound on 12/16/2020 shows mildly enlarged measuring 12.4 x 9.6 x 7 cm with volume of 435 cc. - PDGFR alpha, beta and FGFR 1 was negative. - She was evaluated by Dr. Florene Glen at [facility].  Slides were reviewed at [facility] hematopathology.  They thought it was less likely CMML and more likely MPL positive myeloproliferative neoplasm.  Dr. Florene Glen has recommended initiate Jakafi. - Given anemia, B symptoms, cytogenetics with MPL, ASLX1, normal karyotype she is intermediate risk on GIPPS at high risk MIPSS70+ - Ruxolitinib 10 mg twice daily started on 02/16/2021.  Dose increased to 15 mg twice daily on 05/18/2021. - CTAP on 04/07/2021: Hepatomegaly and splenomegaly measuring 14.3 cm.  No other pathology.  No spleen infarcts. -Ruxolitinib dose increased to 15 mg twice daily on 09/20/2021. - Ultrasound spleen (09/21/2021): 13.6 cm with volume 532 mL.  This is slightly improved from previous volume. - Bone marrow biopsy (09/09/2021): Material is limited but overall features consistent with myeloid neoplasm.  No increase in blasts.  Erythroid precursors appear decreased but megakaryocytes are variably evident with clusters and small forms.  Reticulin's shows variable increase in reticulin fibers including areas with prominent increase.  Chromosome analysis was normal. - Tapering doses of ruxolitinib started on  12/24/2021, last dose on 01/23/2022 -  Evaluated by Dr. Florene Glen on 12/24/2021. - Momelotinib 100 mg daily started on 01/26/2022  2.  Social/family history: - She lives at home and is able to do all her ADLs and IADLs although she is getting tired lately.  She reports quitting smoking 6 months ago and smoked 1 pack/week for 43 years. - She believes that her mother had some kind of leukemia.  No other malignancies.   PLAN:  MPL positive myeloproliferative neoplasm: - She has run out of momelotinib and has not taken it in 3 days.  Her pharmacy is trying to get the medication. - She has developed nausea for 3 days and vomited once yesterday. - Continue Zofran 8 mg ODT which is helping. - Physical exam: Spleen enlarged up to the level of the umbilicus stable. - Reviewed labs today which showed white count is improved to 85 from 113.  Hemoglobin is 10.5 and platelet count 169.  LFTs show mildly elevated AST of 42 and alk phos with normal bilirubin. - She was told to start back on momelotinib 100 mg daily when she receives the prescription.  I will see her back in 3 weeks for follow-up with repeat labs.  2.  Decreased appetite: - She lost 3 pounds since last visit but her weight has been stable around 123 for the last couple of months.  She is eating 2 meals per day.  3.  CKD: - Creatinine today is 1.82.  Baseline creatinine between 1.3-1.6. - Recommend increasing fluid intake to 70 ounces per day.  4.  Hypokalemia: - Continue potassium supplements.  Potassium today is 3.8.  5.  Generalized itching: - Itching slightly improved since momelotinib but still present.    Orders placed this encounter:  No orders of the defined types were placed in this encounter.      Derek Jack, MD [place] [phone]

## 2022-03-02 NOTE — Progress Notes (Signed)
CRITICAL VALUE ALERT Critical value received:  WBC 85.5.  Date of notification:  02-27-2022 Time of notification: 10:25 am.  Critical value read back:  Yes.   Nurse who received alert:  B.Vestal Markin RN.  MD notified time and response:  [name] @ 10:27am . No new orders received. Patient's normal.

## 2022-03-02 NOTE — Patient Instructions (Addendum)
[place] at [facility] Discharge Instructions   You were seen and examined today by [name].  He reviewed the results of your lab work which are normal/stable. Your hemoglobin has improved to 10.5. Your white blood cell count is 85.8 today.   Start taking nausea medication when you first wake up in the morning to help prevent becoming nauseated throughout the day.   Continue Ojjaara as prescribed.    Thank you for choosing [place] at [facility] to provide your oncology and hematology care.  To afford each patient quality time with our provider, please arrive at least 15 minutes before your scheduled appointment time.   If you have a lab appointment with the [facility] please come in thru the Main Entrance and check in at the main information desk.  You need to re-schedule your appointment should you arrive 10 or more minutes late.  We strive to give you quality time with our providers, and arriving late affects you and other patients whose appointments are after yours.  Also, if you no show three or more times for appointments you may be dismissed from the clinic at the providers discretion.     Again, thank you for choosing [facility].  Our hope is that these requests will decrease the amount of time that you wait before being seen by our physicians.       _____________________________________________________________  Should you have questions after your visit to [facility], please contact our office at [phone] and follow the prompts.  Our office hours are 8:00 a.m. and 4:30 p.m. Monday - Friday.  Please note that voicemails left after 4:00 p.m. may not be returned until the following business day.  We are closed weekends and major holidays.  You do have access to a nurse 24-7, just call the main number to the clinic [phone] and do not press any options, hold on the line and a nurse will  answer the phone.    For prescription refill requests, have your pharmacy contact our office and allow 72 hours.    Due to Covid, you will need to wear a mask upon entering the hospital. If you do not have a mask, a mask will be given to you at the Main Entrance upon arrival. For doctor visits, patients may have 1 support person age 16 or older with them. For treatment visits, patients can not have anyone with them due to social distancing guidelines and our immunocompromised population.

## 2022-03-02 NOTE — Progress Notes (Signed)
Patient is taking Ojjaara as prescribed.  She has missed doses (x3 days due to manufacturer shortage). She reports no side effects at this time.

## 2022-03-02 NOTE — Progress Notes (Signed)
Hgb 10.5 no blood products needed Friday.

## 2022-03-03 ENCOUNTER — Ambulatory Visit: Payer: Medicare Other | Admitting: Family Medicine

## 2022-03-03 ENCOUNTER — Other Ambulatory Visit: Payer: Self-pay

## 2022-03-03 ENCOUNTER — Other Ambulatory Visit (HOSPITAL_COMMUNITY): Payer: Self-pay

## 2022-03-03 DIAGNOSIS — M329 Systemic lupus erythematosus, unspecified: Secondary | ICD-10-CM | POA: Diagnosis not present

## 2022-03-03 DIAGNOSIS — C946 Myelodysplastic disease, not classified: Secondary | ICD-10-CM | POA: Diagnosis not present

## 2022-03-03 DIAGNOSIS — N390 Urinary tract infection, site not specified: Secondary | ICD-10-CM | POA: Diagnosis not present

## 2022-03-03 DIAGNOSIS — I129 Hypertensive chronic kidney disease with stage 1 through stage 4 chronic kidney disease, or unspecified chronic kidney disease: Secondary | ICD-10-CM | POA: Diagnosis not present

## 2022-03-03 DIAGNOSIS — G709 Myoneural disorder, unspecified: Secondary | ICD-10-CM | POA: Diagnosis not present

## 2022-03-03 DIAGNOSIS — N179 Acute kidney failure, unspecified: Secondary | ICD-10-CM | POA: Diagnosis not present

## 2022-03-04 ENCOUNTER — Telehealth: Payer: Self-pay | Admitting: Family Medicine

## 2022-03-04 ENCOUNTER — Inpatient Hospital Stay: Payer: Medicare Other

## 2022-03-04 ENCOUNTER — Other Ambulatory Visit (HOSPITAL_COMMUNITY): Payer: Self-pay

## 2022-03-04 NOTE — Telephone Encounter (Signed)
[facility]ie w[facility] called in on patient behalf.   Verbal orders  Home Health aide  2x week 8 weeks   Call back # [phone]

## 2022-03-04 NOTE — Telephone Encounter (Signed)
Verbal order given  

## 2022-03-07 DIAGNOSIS — I129 Hypertensive chronic kidney disease with stage 1 through stage 4 chronic kidney disease, or unspecified chronic kidney disease: Secondary | ICD-10-CM | POA: Diagnosis not present

## 2022-03-07 DIAGNOSIS — N390 Urinary tract infection, site not specified: Secondary | ICD-10-CM | POA: Diagnosis not present

## 2022-03-07 DIAGNOSIS — M329 Systemic lupus erythematosus, unspecified: Secondary | ICD-10-CM | POA: Diagnosis not present

## 2022-03-07 DIAGNOSIS — N179 Acute kidney failure, unspecified: Secondary | ICD-10-CM | POA: Diagnosis not present

## 2022-03-07 DIAGNOSIS — C946 Myelodysplastic disease, not classified: Secondary | ICD-10-CM | POA: Diagnosis not present

## 2022-03-07 DIAGNOSIS — G709 Myoneural disorder, unspecified: Secondary | ICD-10-CM | POA: Diagnosis not present

## 2022-03-09 ENCOUNTER — Other Ambulatory Visit: Payer: Self-pay | Admitting: Family Medicine

## 2022-03-09 MED ORDER — MIRTAZAPINE 15 MG PO TABS: 15.0000 mg | ORAL_TABLET | Freq: Every day | ORAL | 0 refills | Status: DC

## 2022-03-09 MED ORDER — MIRTAZAPINE 15 MG PO TABS: 15.0000 mg | ORAL_TABLET | Freq: Every day | ORAL | 5 refills | Status: DC

## 2022-03-09 NOTE — Addendum Note (Signed)
Addended by: [name] on: 03/09/2022 09:07 PM   Modules accepted: Orders

## 2022-03-10 DIAGNOSIS — N179 Acute kidney failure, unspecified: Secondary | ICD-10-CM | POA: Diagnosis not present

## 2022-03-10 DIAGNOSIS — G709 Myoneural disorder, unspecified: Secondary | ICD-10-CM | POA: Diagnosis not present

## 2022-03-10 DIAGNOSIS — N390 Urinary tract infection, site not specified: Secondary | ICD-10-CM | POA: Diagnosis not present

## 2022-03-10 DIAGNOSIS — M329 Systemic lupus erythematosus, unspecified: Secondary | ICD-10-CM | POA: Diagnosis not present

## 2022-03-10 DIAGNOSIS — I129 Hypertensive chronic kidney disease with stage 1 through stage 4 chronic kidney disease, or unspecified chronic kidney disease: Secondary | ICD-10-CM | POA: Diagnosis not present

## 2022-03-10 DIAGNOSIS — C946 Myelodysplastic disease, not classified: Secondary | ICD-10-CM | POA: Diagnosis not present

## 2022-03-11 ENCOUNTER — Telehealth: Payer: Self-pay | Admitting: Family Medicine

## 2022-03-11 NOTE — Telephone Encounter (Signed)
Tried calling patient back no answer

## 2022-03-11 NOTE — Telephone Encounter (Signed)
Lvm that she was trying to get in touch with Renee Waters nurse. Thinks she has a UTI and urology appt isn't until later this month.

## 2022-03-14 ENCOUNTER — Telehealth (INDEPENDENT_AMBULATORY_CARE_PROVIDER_SITE_OTHER): Payer: Medicare Other | Admitting: Family Medicine

## 2022-03-14 ENCOUNTER — Encounter: Payer: Self-pay | Admitting: Family Medicine

## 2022-03-14 DIAGNOSIS — R300 Dysuria: Secondary | ICD-10-CM | POA: Diagnosis not present

## 2022-03-14 DIAGNOSIS — N3000 Acute cystitis without hematuria: Secondary | ICD-10-CM | POA: Diagnosis not present

## 2022-03-14 DIAGNOSIS — A499 Bacterial infection, unspecified: Secondary | ICD-10-CM | POA: Diagnosis not present

## 2022-03-14 LAB — POCT URINALYSIS DIP (CLINITEK)
Bilirubin, UA: NEGATIVE
Glucose, UA: NEGATIVE mg/dL
Ketones, POC UA: NEGATIVE mg/dL
Nitrite, UA: NEGATIVE
POC PROTEIN,UA: 30 — AB
Spec Grav, UA: 1.015 (ref 1.010–1.025)
Urobilinogen, UA: 0.2 E.U./dL
pH, UA: 5.5 (ref 5.0–8.0)

## 2022-03-14 MED ORDER — SULFAMETHOXAZOLE-TRIMETHOPRIM 800-160 MG PO TABS: 1.0000 | ORAL_TABLET | Freq: Two times a day (BID) | ORAL | 0 refills | Status: AC

## 2022-03-14 NOTE — Progress Notes (Unsigned)
Virtual Visit via Video Note  I connected with Renee Waters on 03/15/22 at  4:40 PM EST by a video enabled telemedicine application and verified that I am speaking with the correct person using two identifiers.  Patient Location: Home Provider Location: Office/Clinic  I discussed the limitations, risks, security, and privacy concerns of performing an evaluation and management service by video and the availability of in person appointments. I also discussed with the patient that there may be a patient responsible charge related to this service. The patient expressed understanding and agreed to proceed.  Subjective: PCP: [name], MD  Chief Complaint  Patient presents with   Urinary Tract Infection    Pt reports urinary odor and cloudy for a week 03/07/2021, started with urinary burning yesterday 03/13/2021.   HPI The patient is in today with complaints of urinary tract infection.  Of note, she does follow-up with urology for recurrent UTIs and was on prophylactic treatment with Trimethoprim 100 mg daily.  She last visited her urologist on 10/29/2021.  She is in today with complaints of dysuria, urgency, and frequency with urination, and noted to have cloudy urine.  She denies fever and chills.   ROS: Per HPI  Current Outpatient Medications:    acyclovir (ZOVIRAX) 800 MG tablet, Take 1 tablet (800 mg total) by mouth 5 (five) times daily as needed., Disp: 50 tablet, Rfl: 1   acyclovir ointment (ZOVIRAX) 5 %, Apply 1 Application topically every 3 (three) hours as needed., Disp: 15 g, Rfl: 1   allopurinol 200 MG TABS, Take 200 mg by mouth daily., Disp: 30 tablet, Rfl: 0   busPIRone (BUSPAR) 5 MG tablet, Take 1 tablet (5 mg total) by mouth daily as needed (anxiety). Take one tablet by mouth once daily, as needed, for anxiety, Disp: , Rfl:    cetirizine (ZYRTEC) 10 MG tablet, Take 10 mg by mouth daily., Disp: , Rfl:    citalopram (CELEXA) 20 MG tablet, Take 1 tablet by mouth  once daily, Disp: 30 tablet, Rfl: 0   EPINEPHrine 0.3 mg/0.3 mL IJ SOAJ injection, INJECT 0.3 MLS INTO MUSCLE ONCE AS NEEDED, Disp: 1 each, Rfl: 2   furosemide (LASIX) 20 MG tablet, TAKE 1 TABLET BY MOUTH ONCE DAILY AS NEEDED, Disp: 30 tablet, Rfl: 3   hydrOXYzine (ATARAX) 25 MG tablet, Take 12.5-25 mg by mouth 2 (two) times daily as needed., Disp: , Rfl:    lidocaine-prilocaine (EMLA) cream, Apply 1 Application topically as needed (Access to port and pain)., Disp: 30 g, Rfl: 6   metoprolol tartrate (LOPRESSOR) 25 MG tablet, Take one and a half tablets twice daily, Disp: 180 tablet, Rfl: 3   mirtazapine (REMERON) 15 MG tablet, Take 1 tablet (15 mg total) by mouth at bedtime., Disp: 30 tablet, Rfl: 5   Momelotinib Dihydrochloride (OJJAARA) 100 MG TABS, Take 100 mg by mouth daily., Disp: , Rfl:    Momelotinib Dihydrochloride 100 MG TABS, Take 1 tablet (100 mg) by mouth daily., Disp: 30 tablet, Rfl: 6   Multiple Vitamin (MULTIVITAMIN WITH MINERALS) TABS tablet, Take 1 tablet by mouth daily., Disp: , Rfl:    ondansetron (ZOFRAN-ODT) 8 MG disintegrating tablet, Take 1 tablet (8 mg total) by mouth every 8 (eight) hours as needed for nausea or vomiting., Disp: 20 tablet, Rfl: 0   oxyCODONE (ROXICODONE) 15 MG immediate release tablet, Take 15 mg by mouth every 6 (six) hours as needed., Disp: , Rfl:    pantoprazole (PROTONIX) 40 MG tablet, Take 1  tablet (40 mg total) by mouth 2 (two) times daily before a meal., Disp: 60 tablet, Rfl: 0   potassium chloride (KLOR-CON) 10 MEQ tablet, Take 1 tablet by mouth once daily, Disp: 30 tablet, Rfl: 4   prochlorperazine (COMPAZINE) 10 MG tablet, Take 1 tablet (10 mg total) by mouth every 6 (six) hours as needed for nausea or vomiting., Disp: 60 tablet, Rfl: 1   sulfamethoxazole-trimethoprim (BACTRIM DS) 800-160 MG tablet, Take 1 tablet by mouth every 12 (twelve) hours for 10 days., Disp: 20 tablet, Rfl: 0   temazepam (RESTORIL) 30 MG capsule, Take 1 capsule (30 mg total)  by mouth at bedtime as needed for sleep., Disp: 30 capsule, Rfl: 5   tiZANidine (ZANAFLEX) 4 MG tablet, Take by mouth., Disp: , Rfl:  No current facility-administered medications for this visit.  Facility-Administered Medications Ordered in Other Visits:    lanreotide acetate (SOMATULINE DEPOT) 120 MG/0.5ML injection, , , ,    lanreotide acetate (SOMATULINE DEPOT) 120 MG/0.5ML injection, , , ,    lanreotide acetate (SOMATULINE DEPOT) 120 MG/0.5ML injection, , , ,    octreotide (SANDOSTATIN LAR) 30 MG IM injection, , , ,    octreotide (SANDOSTATIN LAR) 30 MG IM injection, , , ,   Observations/Objective: There were no vitals filed for this visit. Physical Exam Patient is well-developed, well-nourished in no acute distress.  Resting comfortably at home.  Head is normocephalic, atraumatic.  No labored breathing.  Speech is clear and coherent with logical content.  Patient is alert and oriented at baseline.   Assessment and Plan: Dysuria -     POCT URINALYSIS DIP (CLINITEK) -     Urine Culture  Acute cystitis without hematuria -     Urine Culture  UTI (urinary tract infection), bacterial -     Sulfamethoxazole-Trimethoprim; Take 1 tablet by mouth every 12 (twelve) hours for 10 days.  Dispense: 20 tablet; Refill: 0  UA is positive for leukocytes  Will treat with Bactrim for 10 days Please complete the full course of the antibiotics   You can help prevent UTIs by doing the following:  -Avoid holding urine for prolonged periods; this stretches the bladder and causes bacteria to form because bacteria like warm and wet environments to grow -Empty the bladder as soon as the need arises.  -Empty your bladder soon after intercourse.  -Take showers instead of baths -Wipe front to back; doing so after urinating and after a bowel movement helps prevent bacteria in the anal region from spreading to the vagina and urethra. -Also, drink a full glass of water to help flush bacteria.   Follow  Up Instructions: As needed   I discussed the assessment and treatment plan with the patient. The patient was provided an opportunity to ask questions, and all were answered. The patient agreed with the plan and demonstrated an understanding of the instructions.   The patient was advised to call back or seek an in-person evaluation if the symptoms worsen or if the condition fails to improve as anticipated.  The above assessment and management plan was discussed with the patient. The patient verbalized understanding of and has agreed to the management plan.   Alvira Monday, FNP

## 2022-03-15 DIAGNOSIS — I129 Hypertensive chronic kidney disease with stage 1 through stage 4 chronic kidney disease, or unspecified chronic kidney disease: Secondary | ICD-10-CM | POA: Diagnosis not present

## 2022-03-15 DIAGNOSIS — G709 Myoneural disorder, unspecified: Secondary | ICD-10-CM | POA: Diagnosis not present

## 2022-03-15 DIAGNOSIS — C946 Myelodysplastic disease, not classified: Secondary | ICD-10-CM | POA: Diagnosis not present

## 2022-03-15 DIAGNOSIS — N179 Acute kidney failure, unspecified: Secondary | ICD-10-CM | POA: Diagnosis not present

## 2022-03-15 DIAGNOSIS — M329 Systemic lupus erythematosus, unspecified: Secondary | ICD-10-CM | POA: Diagnosis not present

## 2022-03-15 DIAGNOSIS — N390 Urinary tract infection, site not specified: Secondary | ICD-10-CM | POA: Diagnosis not present

## 2022-03-16 DIAGNOSIS — F419 Anxiety disorder, unspecified: Secondary | ICD-10-CM | POA: Diagnosis not present

## 2022-03-16 DIAGNOSIS — M519 Unspecified thoracic, thoracolumbar and lumbosacral intervertebral disc disorder: Secondary | ICD-10-CM | POA: Diagnosis not present

## 2022-03-16 DIAGNOSIS — K219 Gastro-esophageal reflux disease without esophagitis: Secondary | ICD-10-CM | POA: Diagnosis not present

## 2022-03-16 DIAGNOSIS — D631 Anemia in chronic kidney disease: Secondary | ICD-10-CM | POA: Diagnosis not present

## 2022-03-16 DIAGNOSIS — Z79891 Long term (current) use of opiate analgesic: Secondary | ICD-10-CM | POA: Diagnosis not present

## 2022-03-16 DIAGNOSIS — C946 Myelodysplastic disease, not classified: Secondary | ICD-10-CM | POA: Diagnosis not present

## 2022-03-16 DIAGNOSIS — N390 Urinary tract infection, site not specified: Secondary | ICD-10-CM | POA: Diagnosis not present

## 2022-03-16 DIAGNOSIS — G4700 Insomnia, unspecified: Secondary | ICD-10-CM | POA: Diagnosis not present

## 2022-03-16 DIAGNOSIS — R7401 Elevation of levels of liver transaminase levels: Secondary | ICD-10-CM | POA: Diagnosis not present

## 2022-03-16 DIAGNOSIS — N183 Chronic kidney disease, stage 3 unspecified: Secondary | ICD-10-CM | POA: Diagnosis not present

## 2022-03-16 DIAGNOSIS — E860 Dehydration: Secondary | ICD-10-CM | POA: Diagnosis not present

## 2022-03-16 DIAGNOSIS — K2270 Barrett's esophagus without dysplasia: Secondary | ICD-10-CM | POA: Diagnosis not present

## 2022-03-16 DIAGNOSIS — M549 Dorsalgia, unspecified: Secondary | ICD-10-CM | POA: Diagnosis not present

## 2022-03-16 DIAGNOSIS — Z87891 Personal history of nicotine dependence: Secondary | ICD-10-CM | POA: Diagnosis not present

## 2022-03-16 DIAGNOSIS — G8929 Other chronic pain: Secondary | ICD-10-CM | POA: Diagnosis not present

## 2022-03-16 DIAGNOSIS — M542 Cervicalgia: Secondary | ICD-10-CM | POA: Diagnosis not present

## 2022-03-16 DIAGNOSIS — I129 Hypertensive chronic kidney disease with stage 1 through stage 4 chronic kidney disease, or unspecified chronic kidney disease: Secondary | ICD-10-CM | POA: Diagnosis not present

## 2022-03-16 DIAGNOSIS — F32A Depression, unspecified: Secondary | ICD-10-CM | POA: Diagnosis not present

## 2022-03-16 DIAGNOSIS — N179 Acute kidney failure, unspecified: Secondary | ICD-10-CM | POA: Diagnosis not present

## 2022-03-16 DIAGNOSIS — G709 Myoneural disorder, unspecified: Secondary | ICD-10-CM | POA: Diagnosis not present

## 2022-03-16 DIAGNOSIS — M329 Systemic lupus erythematosus, unspecified: Secondary | ICD-10-CM | POA: Diagnosis not present

## 2022-03-16 DIAGNOSIS — M1990 Unspecified osteoarthritis, unspecified site: Secondary | ICD-10-CM | POA: Diagnosis not present

## 2022-03-16 DIAGNOSIS — D696 Thrombocytopenia, unspecified: Secondary | ICD-10-CM | POA: Diagnosis not present

## 2022-03-17 DIAGNOSIS — C946 Myelodysplastic disease, not classified: Secondary | ICD-10-CM | POA: Diagnosis not present

## 2022-03-17 DIAGNOSIS — M329 Systemic lupus erythematosus, unspecified: Secondary | ICD-10-CM | POA: Diagnosis not present

## 2022-03-17 DIAGNOSIS — N179 Acute kidney failure, unspecified: Secondary | ICD-10-CM | POA: Diagnosis not present

## 2022-03-17 DIAGNOSIS — G709 Myoneural disorder, unspecified: Secondary | ICD-10-CM | POA: Diagnosis not present

## 2022-03-17 DIAGNOSIS — I129 Hypertensive chronic kidney disease with stage 1 through stage 4 chronic kidney disease, or unspecified chronic kidney disease: Secondary | ICD-10-CM | POA: Diagnosis not present

## 2022-03-17 DIAGNOSIS — N390 Urinary tract infection, site not specified: Secondary | ICD-10-CM | POA: Diagnosis not present

## 2022-03-18 LAB — URINE CULTURE

## 2022-03-21 ENCOUNTER — Other Ambulatory Visit: Payer: Self-pay | Admitting: Internal Medicine

## 2022-03-21 ENCOUNTER — Other Ambulatory Visit (HOSPITAL_COMMUNITY): Payer: Self-pay

## 2022-03-23 ENCOUNTER — Other Ambulatory Visit (HOSPITAL_COMMUNITY): Payer: Self-pay

## 2022-03-23 ENCOUNTER — Other Ambulatory Visit: Payer: Self-pay

## 2022-03-23 DIAGNOSIS — Z515 Encounter for palliative care: Secondary | ICD-10-CM | POA: Diagnosis not present

## 2022-03-23 DIAGNOSIS — D47Z9 Other specified neoplasms of uncertain behavior of lymphoid, hematopoietic and related tissue: Secondary | ICD-10-CM | POA: Diagnosis not present

## 2022-03-24 ENCOUNTER — Other Ambulatory Visit: Payer: Self-pay

## 2022-03-25 DIAGNOSIS — M329 Systemic lupus erythematosus, unspecified: Secondary | ICD-10-CM | POA: Diagnosis not present

## 2022-03-25 DIAGNOSIS — G709 Myoneural disorder, unspecified: Secondary | ICD-10-CM | POA: Diagnosis not present

## 2022-03-25 DIAGNOSIS — C946 Myelodysplastic disease, not classified: Secondary | ICD-10-CM | POA: Diagnosis not present

## 2022-03-25 DIAGNOSIS — I129 Hypertensive chronic kidney disease with stage 1 through stage 4 chronic kidney disease, or unspecified chronic kidney disease: Secondary | ICD-10-CM | POA: Diagnosis not present

## 2022-03-25 DIAGNOSIS — N179 Acute kidney failure, unspecified: Secondary | ICD-10-CM | POA: Diagnosis not present

## 2022-03-25 DIAGNOSIS — N390 Urinary tract infection, site not specified: Secondary | ICD-10-CM | POA: Diagnosis not present

## 2022-03-28 ENCOUNTER — Inpatient Hospital Stay: Payer: Medicare Other | Attending: Hematology

## 2022-03-28 ENCOUNTER — Inpatient Hospital Stay (HOSPITAL_BASED_OUTPATIENT_CLINIC_OR_DEPARTMENT_OTHER): Payer: Medicare Other | Admitting: Hematology

## 2022-03-28 ENCOUNTER — Other Ambulatory Visit: Payer: Self-pay | Admitting: Family Medicine

## 2022-03-28 VITALS — BP 155/50 | HR 63 | Temp 98.7°F | Resp 18

## 2022-03-28 DIAGNOSIS — Z87891 Personal history of nicotine dependence: Secondary | ICD-10-CM | POA: Insufficient documentation

## 2022-03-28 DIAGNOSIS — E876 Hypokalemia: Secondary | ICD-10-CM | POA: Diagnosis not present

## 2022-03-28 DIAGNOSIS — Z96641 Presence of right artificial hip joint: Secondary | ICD-10-CM | POA: Insufficient documentation

## 2022-03-28 DIAGNOSIS — R630 Anorexia: Secondary | ICD-10-CM | POA: Diagnosis not present

## 2022-03-28 DIAGNOSIS — L299 Pruritus, unspecified: Secondary | ICD-10-CM | POA: Insufficient documentation

## 2022-03-28 DIAGNOSIS — Z79899 Other long term (current) drug therapy: Secondary | ICD-10-CM | POA: Diagnosis not present

## 2022-03-28 DIAGNOSIS — D471 Chronic myeloproliferative disease: Secondary | ICD-10-CM

## 2022-03-28 DIAGNOSIS — D649 Anemia, unspecified: Secondary | ICD-10-CM | POA: Diagnosis not present

## 2022-03-28 DIAGNOSIS — N183 Chronic kidney disease, stage 3 unspecified: Secondary | ICD-10-CM | POA: Insufficient documentation

## 2022-03-28 LAB — CBC WITH DIFFERENTIAL/PLATELET
Abs Immature Granulocytes: 12.2 10*3/uL — ABNORMAL HIGH (ref 0.00–0.07)
Band Neutrophils: 7 %
Basophils Absolute: 0 10*3/uL (ref 0.0–0.1)
Basophils Relative: 0 %
Eosinophils Absolute: 0.6 10*3/uL — ABNORMAL HIGH (ref 0.0–0.5)
Eosinophils Relative: 1 %
HCT: 23.8 % — ABNORMAL LOW (ref 36.0–46.0)
Hemoglobin: 7.7 g/dL — ABNORMAL LOW (ref 12.0–15.0)
Lymphocytes Relative: 2 %
Lymphs Abs: 1.1 10*3/uL (ref 0.7–4.0)
MCH: 30.2 pg (ref 26.0–34.0)
MCHC: 32.4 g/dL (ref 30.0–36.0)
MCV: 93.3 fL (ref 80.0–100.0)
Metamyelocytes Relative: 13 %
Monocytes Absolute: 6.1 10*3/uL — ABNORMAL HIGH (ref 0.1–1.0)
Monocytes Relative: 11 %
Myelocytes: 8 %
Neutro Abs: 35.5 10*3/uL — ABNORMAL HIGH (ref 1.7–7.7)
Neutrophils Relative %: 57 %
Platelets: 122 10*3/uL — ABNORMAL LOW (ref 150–400)
Promyelocytes Relative: 1 %
RBC: 2.55 MIL/uL — ABNORMAL LOW (ref 3.87–5.11)
RDW: 22.4 % — ABNORMAL HIGH (ref 11.5–15.5)
WBC: 55.5 10*3/uL (ref 4.0–10.5)
nRBC: 10 /100 WBC — ABNORMAL HIGH
nRBC: 7.2 % — ABNORMAL HIGH (ref 0.0–0.2)

## 2022-03-28 LAB — COMPREHENSIVE METABOLIC PANEL
ALT: 25 U/L (ref 0–44)
AST: 32 U/L (ref 15–41)
Albumin: 3.9 g/dL (ref 3.5–5.0)
Alkaline Phosphatase: 138 U/L — ABNORMAL HIGH (ref 38–126)
Anion gap: 7 (ref 5–15)
BUN: 39 mg/dL — ABNORMAL HIGH (ref 8–23)
CO2: 19 mmol/L — ABNORMAL LOW (ref 22–32)
Calcium: 8.6 mg/dL — ABNORMAL LOW (ref 8.9–10.3)
Chloride: 108 mmol/L (ref 98–111)
Creatinine, Ser: 2.34 mg/dL — ABNORMAL HIGH (ref 0.44–1.00)
GFR, Estimated: 21 mL/min — ABNORMAL LOW (ref >60)
Glucose, Bld: 103 mg/dL — ABNORMAL HIGH (ref 70–99)
Potassium: 4.6 mmol/L (ref 3.5–5.1)
Sodium: 134 mmol/L — ABNORMAL LOW (ref 135–145)
Total Bilirubin: 0.6 mg/dL (ref 0.3–1.2)
Total Protein: 8.1 g/dL (ref 6.5–8.1)

## 2022-03-28 LAB — MAGNESIUM: Magnesium: 1.8 mg/dL (ref 1.7–2.4)

## 2022-03-28 LAB — SAMPLE TO BLOOD BANK

## 2022-03-28 LAB — LACTATE DEHYDROGENASE: LDH: 637 U/L — ABNORMAL HIGH (ref 98–192)

## 2022-03-28 LAB — PREPARE RBC (CROSSMATCH)

## 2022-03-28 MED ORDER — HEPARIN SOD (PORK) LOCK FLUSH 100 UNIT/ML IV SOLN
500.0000 [IU] | Freq: Once | INTRAVENOUS | Status: AC
  Administered 2022-03-28: 500 [IU] via INTRAVENOUS

## 2022-03-28 MED ORDER — SODIUM CHLORIDE 0.9% FLUSH
10.0000 mL | Freq: Once | INTRAVENOUS | Status: AC
  Administered 2022-03-28: 10 mL via INTRAVENOUS

## 2022-03-28 NOTE — Progress Notes (Signed)
Per Scheduling Amy Nance / [name] , patient needs one unit of blood. HGB 7.8. Patient to return to the clinic on Wednesday 03-30-2022 for blood products. Patient aware. Blood bank notified.

## 2022-03-28 NOTE — Progress Notes (Signed)
[facility] [address] [place] [address]    Clinic Day:  03/28/2022  Referring physician: [name], MD  Patient Care Team: [name], MD as PCP - General Branch, Royetta Crochet, MD as PCP - Cardiology (Cardiology) Gala Romney [name], MD (Gastroenterology) Carole Civil, MD as Consulting Physician (Orthopedic Surgery) Derek Jack, MD as Medical Oncologist (Oncology) Brien Mates, RN as Oncology Nurse Navigator (Oncology) Barbaraann Faster, RN as [facility]   ASSESSMENT & PLAN:   Assessment:  MPL positive prefibrotic/early primary myelofibrosis: - CBC on 11/06/2020 with white count 75.6, differential 59% neutrophils, 12% monocytes, 4% lymphocytes, 1 percentage of eosinophils and basophils, 6% band neutrophils, 12% myelocytes, 1% promyelocytes, 29% blasts. - Pathologist review of blood smear reported as leukoerythroblastic reaction. - 25 pound weight loss in the last 6 months, unintentional.  Decreased appetite.  Reports fatigue for the last few months.  Reports night sweats x3 in the last 1 month. - Bone marrow biopsy on 11/18/2020 consistent with granulocytic proliferation with differential including CMML versus myeloproliferative disorder. - BCR/ABL was negative. 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 - NGS myeloid panel shows mutations in ASXL1, MPL, TET2, EZH2 mutations. - Spleen ultrasound on 12/16/2020 shows mildly enlarged measuring 12.4 x 9.6 x 7 cm with volume of 435 cc. - PDGFR alpha, beta and FGFR 1 was negative. - She was evaluated by Dr. Florene Glen at [facility].  Slides were reviewed at [facility] hematopathology.  They thought it was less likely CMML and more likely MPL positive myeloproliferative neoplasm.  Dr. Florene Glen has recommended initiate Jakafi. - Given anemia, B symptoms, cytogenetics with MPL, ASLX1, normal karyotype she is intermediate risk on  GIPPS at high risk MIPSS70+ - Ruxolitinib 10 mg twice daily started on 02/16/2021.  Dose increased to 15 mg twice daily on 05/18/2021. - CTAP on 04/07/2021: Hepatomegaly and splenomegaly measuring 14.3 cm.  No other pathology.  No spleen infarcts. -Ruxolitinib dose increased to 15 mg twice daily on 09/20/2021. - Ultrasound spleen (09/21/2021): 13.6 cm with volume 532 mL.  This is slightly improved from previous volume. - Bone marrow biopsy (09/09/2021): Material is limited but overall features consistent with myeloid neoplasm.  No increase in blasts.  Erythroid precursors appear decreased but megakaryocytes are variably evident with clusters and small forms.  Reticulin's shows variable increase in reticulin fibers including areas with prominent increase.  Chromosome analysis was normal. - Tapering doses of ruxolitinib started on 12/24/2021, last dose on 01/23/2022 - Evaluated by Dr. Florene Glen on 12/24/2021. - Momelotinib 100 mg daily started on 01/26/2022  2.  Social/family history: - She lives at home and is able to do all her ADLs and IADLs although she is getting tired lately.  She reports quitting smoking 6 months ago and smoked 1 pack/week for 43 years. - She believes that her mother had some kind of leukemia.  No other malignancies.    Plan: MPL positive myeloproliferative neoplasm: - She started back on momelotinib 100 mg daily 2 to 3 days after last visit.  She is taking it at nighttime as she felt dizzy during daytime when she took it in the mornings. - She is taking Compazine in the mornings which is helping with her daytime nausea. - She reportedly felt dizzy last week and fell on Wednesday.  No injuries reported. - She was recently treated for E. coli UTI with Bactrim based on cultures from 03/14/2022. -  Reviewed labs today which showed normal LFTs with improved alk phos.  Bilirubin is normal.  White count is 655, hemoglobin 7.7 and platelet count 122. - We will schedule her for 1 unit PRBC. -  Spleen is palpable about 5 to 6 fingerbreadths below the left costal margin. - Continue momelotinib 100 mg daily.  RTC 3 weeks for follow-up with repeat labs.  2.  Decreased appetite: - She lost about 5 pounds in the last 3 weeks.  She reports decreased eating and is eating about 2 and half meals per day.  3.  CKD: - Baseline creatinine 1.3-1.6. - Creatinine today is 2.34.  Likely from recent Bactrim use.  Will give final mL of normal saline.  4.  Hypokalemia: - Continue potassium supplements.  Potassium today is 4.6.   5.  Generalized itching: - Itching has improved since momelotinib started.    Orders Placed This Encounter  Procedures   CBC with Differential/Platelet    Standing Status:   Future    Standing Expiration Date:   03/29/2023    Order Specific Question:   Release to patient    Answer:   Immediate   Lactate dehydrogenase    Standing Status:   Future    Standing Expiration Date:   03/29/2023    Order Specific Question:   Release to patient    Answer:   Immediate   Comprehensive metabolic panel    Standing Status:   Future    Standing Expiration Date:   03/29/2023    Order Specific Question:   Release to patient    Answer:   Immediate   Magnesium    Standing Status:   Future    Standing Expiration Date:   03/29/2023    Order Specific Question:   Release to patient    Answer:   Immediate   Sample to Blood Bank(Blood Bank Hold)    Standing Status:   Future    Standing Expiration Date:   03/29/2023      Kaleen Odea as a scribe for Derek Jack, MD.,have documented all relevant documentation on the behalf of Derek Jack, MD,as directed by  Derek Jack, MD while in the presence of Derek Jack, MD.   I, Derek Jack MD, have reviewed the above documentation for accuracy and completeness, and I agree with the above.   Derek Jack, MD   2/19/20246:20 PM  CHIEF COMPLAINT:   Diagnosis: MPL positive  myeloproliferative neoplasm  Cancer Staging  No matching staging information was found for the patient.   Prior Therapy: Jakafi 10 mg twice daily   Current Therapy: Momelotinib 100 mg daily    HISTORY OF PRESENT ILLNESS:   Oncology History   No history exists.     INTERVAL HISTORY:   Renee Waters is a 77 y.o. female presenting to clinic today for follow up of MPL positive myeloproliferative neoplasm . She was last seen by me on 03/02/2022.  Today, she states that she is doing well overall. Her appetite level is at 65%. Her energy level is at 70%. She has 7/10 hip pain. She lost 5lbs unintentionally due to not eating properly. She's trying a new system of eating which is eating only when hungry. She typically eats 2-2.5 meals a day.She often gets full after the first two bites. She is nauseas and takes medicine to help. She is also dizzy at times.03/23/2022 she felt lightheaded and fell. She denies having diarrhea. She no longer feels a soreness when urinating. However, her UTI  constantly returns.   She currently takes compazine. She typically takes it early in the morning (5-am).   PAST MEDICAL HISTORY:   Past Medical History: Past Medical History:  Diagnosis Date   Acute cholangitis    Allergy    Anemia    Anxiety    Arthritis    Phreesia 03/18/2020   Barrett's esophagus    Cataract    Chronic back pain    Chronic neck pain    CKD (chronic kidney disease) stage 3, GFR 30-59 ml/min ([place]) 10/29/2020   Depression    Genital herpes    GERD (gastroesophageal reflux disease)    H/O degenerative disc disease    History of blood transfusion    Hypertension    Hypoxia 12/01/2021   Insomnia    Lupus (systemic lupus erythematosus) ([place])    Neuromuscular disorder ([place])    Osteoarthritis    S/P colonoscopy 07/2003   normal, no polyps   S/P endoscopy June 2005, Oct 2009   2005: short-segment Barrett's, 2009: short-segment Barrett's   Upper respiratory tract infection 07/09/2020    UTI (lower urinary tract infection) 11/2012    Surgical History: Past Surgical History:  Procedure Laterality Date   ABDOMINAL HYSTERECTOMY     BACK SURGERY     BIOPSY N/A 03/20/2014   Procedure: BIOPSY;  Surgeon: Daneil Dolin, MD;  Location: [facility];  Service: Endoscopy;  Laterality: N/A;   BIOPSY  09/14/2015   Procedure: BIOPSY;  Surgeon: Daneil Dolin, MD;  Location: [facility];  Service: Endoscopy;;  esophageal and gastric   BIOPSY  07/23/2019   Procedure: BIOPSY;  Surgeon: Daneil Dolin, MD;  Location: [facility];  Service: Endoscopy;;  esophageal    CARPAL TUNNEL RELEASE Left 2013   cervical disectomy  2002   CESAREAN SECTION N/A    Phreesia 03/18/2020   CHOLECYSTECTOMY     with lysis of adhesions for sbo; "ruptured gallbladder".   COLONOSCOPY  11/09/2011   RMR: Melanosis coli   COLONOSCOPY WITH PROPOFOL N/A 09/14/2015   Dr. Gala Romney: diverticulosis, 3m TA removed. next TCS 09/2020.    COLONOSCOPY WITH PROPOFOL N/A 04/30/2020   Procedure: COLONOSCOPY WITH PROPOFOL;  Surgeon: RDaneil Dolin MD;  Location: [facility];  Service: Endoscopy;  Laterality: N/A;  PM (ASA 3)   DENTAL SURGERY  11/2015   multiple tooth extraction   ESOPHAGOGASTRODUODENOSCOPY  11/29/2007   salmon-colored  tongue   longest stable at  3 cm, distal esophagus as described previously status post biopsy/ Hiatal hernia, otherwise normal stomach D1 and D2   ESOPHAGOGASTRODUODENOSCOPY  01/06/11   short segment Barrett's esophagus s/p bx/Hiatal hernia   ESOPHAGOGASTRODUODENOSCOPY (EGD) WITH PROPOFOL N/A 03/20/2014   [id number] esophagus short segment barrett's, bx with no dysplasia. next egd in 03/2017   ESOPHAGOGASTRODUODENOSCOPY (EGD) WITH PROPOFOL N/A 09/14/2015   Dr. RGala Romney Barrett's without dysplasia, gastritis benign bx, hiatal hernia. next EGD 09/2018.   ESOPHAGOGASTRODUODENOSCOPY (EGD) WITH PROPOFOL N/A 07/23/2019   Procedure: ESOPHAGOGASTRODUODENOSCOPY (EGD) WITH PROPOFOL;  Surgeon: RDaneil Dolin MD;  [facility];  Service: Endoscopy;  Laterality: N/A;  3:00pm   EYE SURGERY N/A    Phreesia 03/18/2020   HERNIA REPAIR Right 07/2010   Dr. BZada Girt  IR IMAGING GUIDED PORT INSERTION  02/09/2021   JOINT REPLACEMENT     [facility]  POLYPECTOMY  09/14/2015   Procedure: POLYPECTOMY;  Surgeon: RDaneil Dolin MD;  Location:  [facility];  Service: Endoscopy;;  ascending colon   right hip replacement  07/2010   went back in sept 2012 to fix   SHOULDER ARTHROSCOPY  2008   left   SPINE SURGERY N/A    Phreesia 03/18/2020   TOTAL HIP REVISION Right 12/17/2012   Procedure: RIGHT TOTAL HIP REVISION;  Surgeon: Mauri Pole, MD;  Location: [facility];  Service: Orthopedics;  Laterality: Right;   WRIST SURGERY Right 2011   open reduction right wrist.    Social History: Social History   Socioeconomic History   Marital status: Married    Spouse name: louis   Number of children: 4   Years of education: 12+   Highest education level: Some college, no degree  Occupational History   Occupation: Press photographer  - retired   Occupation: non profit  Tobacco Use   Smoking status: Former    Packs/day: 0.25    Years: 25.00    Total pack years: 6.25    Types: Cigarettes    Quit date: 02/07/2003    Years since quitting: 19.1   Smokeless tobacco: Never   Tobacco comments:    quit in 2004  Vaping Use   Vaping Use: Never used  Substance and Sexual Activity   Alcohol use: No   Drug use: No   Sexual activity: Not Currently    Birth control/protection: Surgical  Other Topics Concern   Not on file  Social History Narrative   Not on file   Social Determinants of Health   Financial Resource Strain: Low Risk  (12/28/2020)   Overall Financial Resource Strain (CARDIA)    Difficulty of Paying Living Expenses: Not hard at all  Food Insecurity: No Food Insecurity (02/08/2022)   Hunger Vital Sign    Worried About Running Out of Food in the Last Year:  Never true    Intercourse in the Last Year: Never true  Transportation Needs: No Transportation Needs (02/11/2022)   PRAPARE - Hydrologist (Medical): No    Lack of Transportation (Non-Medical): No  Physical Activity: Inactive (04/23/2021)   Exercise Vital Sign    Days of Exercise per Week: 0 days    Minutes of Exercise per Session: 0 min  Stress: Stress Concern Present (04/23/2021)   [place]    Feeling of Stress : Rather much  Social Connections: Moderately Integrated (12/28/2020)   Social Connection and Isolation Panel [NHANES]    Frequency of Communication with Friends and Family: More than three times a week    Frequency of Social Gatherings with Friends and Family: Once a week    Attends Religious Services: More than 4 times per year    Active Member of [employer] or Organizations: No    Attends Archivist Meetings: Never    Marital Status: Married  Human resources officer Violence: Not At Risk (02/08/2022)   Humiliation, Afraid, Rape, and Kick questionnaire    Fear of Current or Ex-Partner: No    Emotionally Abused: No    Physically Abused: No    Sexually Abused: No    Family History: Family History  Problem Relation Age of Onset   Hypertension Mother    Stroke Mother    Colon cancer Neg Hx    Anesthesia problems Neg Hx    Hypotension Neg Hx    Malignant hyperthermia Neg Hx    Pseudochol deficiency Neg Hx    Gastric cancer  Neg Hx    Esophageal cancer Neg Hx     Current Medications:  Current Outpatient Medications:    acyclovir (ZOVIRAX) 800 MG tablet, Take 1 tablet (800 mg total) by mouth 5 (five) times daily as needed., Disp: 50 tablet, Rfl: 1   acyclovir ointment (ZOVIRAX) 5 %, Apply 1 Application topically every 3 (three) hours as needed., Disp: 15 g, Rfl: 1   allopurinol 200 MG TABS, Take 200 mg by mouth daily., Disp: 30 tablet, Rfl: 0   busPIRone (BUSPAR) 5 MG  tablet, Take 1 tablet (5 mg total) by mouth daily as needed (anxiety). Take one tablet by mouth once daily, as needed, for anxiety, Disp: , Rfl:    cetirizine (ZYRTEC) 10 MG tablet, Take 10 mg by mouth daily., Disp: , Rfl:    citalopram (CELEXA) 20 MG tablet, Take 1 tablet by mouth once daily, Disp: 30 tablet, Rfl: 0   EPINEPHrine 0.3 mg/0.3 mL IJ SOAJ injection, INJECT 0.3 MLS INTO MUSCLE ONCE AS NEEDED, Disp: 1 each, Rfl: 2   furosemide (LASIX) 20 MG tablet, TAKE 1 TABLET BY MOUTH ONCE DAILY AS NEEDED, Disp: 30 tablet, Rfl: 3   hydrOXYzine (ATARAX) 25 MG tablet, Take 12.5-25 mg by mouth 2 (two) times daily as needed., Disp: , Rfl:    lidocaine-prilocaine (EMLA) cream, Apply 1 Application topically as needed (Access to port and pain)., Disp: 30 g, Rfl: 6   metoprolol tartrate (LOPRESSOR) 25 MG tablet, Take one and a half tablets twice daily, Disp: 180 tablet, Rfl: 3   mirtazapine (REMERON) 15 MG tablet, Take 1 tablet (15 mg total) by mouth at bedtime., Disp: 30 tablet, Rfl: 5   momelotinib dihydrochloride (OJJAARA) 100 MG tablet, Take 1 tablet (100 mg) by mouth daily., Disp: 30 tablet, Rfl: 6   Momelotinib Dihydrochloride (OJJAARA) 100 MG TABS, Take 100 mg by mouth daily., Disp: , Rfl:    Multiple Vitamin (MULTIVITAMIN WITH MINERALS) TABS tablet, Take 1 tablet by mouth daily., Disp: , Rfl:    ondansetron (ZOFRAN-ODT) 8 MG disintegrating tablet, Take 1 tablet (8 mg total) by mouth every 8 (eight) hours as needed for nausea or vomiting., Disp: 20 tablet, Rfl: 0   oxyCODONE (ROXICODONE) 15 MG immediate release tablet, Take 15 mg by mouth every 6 (six) hours as needed., Disp: , Rfl:    pantoprazole (PROTONIX) 40 MG tablet, TAKE TABELT BY MOUTH 30-60 MINUTES BEFORE YOUR FIRST AND LAST MEALS OF THE DAY, Disp: 60 tablet, Rfl: 2   potassium chloride (KLOR-CON) 10 MEQ tablet, Take 1 tablet by mouth once daily, Disp: 30 tablet, Rfl: 4   prochlorperazine (COMPAZINE) 10 MG tablet, Take 1 tablet (10 mg total) by  mouth every 6 (six) hours as needed for nausea or vomiting., Disp: 60 tablet, Rfl: 1   temazepam (RESTORIL) 30 MG capsule, Take 1 capsule (30 mg total) by mouth at bedtime as needed for sleep., Disp: 30 capsule, Rfl: 5   tiZANidine (ZANAFLEX) 4 MG tablet, Take by mouth., Disp: , Rfl:  No current facility-administered medications for this visit.  Facility-Administered Medications Ordered in Other Visits:    lanreotide acetate (SOMATULINE DEPOT) 120 MG/0.5ML injection, , , ,    lanreotide acetate (SOMATULINE DEPOT) 120 MG/0.5ML injection, , , ,    lanreotide acetate (SOMATULINE DEPOT) 120 MG/0.5ML injection, , , ,    octreotide (SANDOSTATIN LAR) 30 MG IM injection, , , ,    octreotide (SANDOSTATIN LAR) 30 MG IM injection, , , ,    Allergies: Allergies  Allergen Reactions   Bee Venom Swelling and Hives   Norvasc [Amlodipine] Other (See Comments)    Hair loss   Tylenol [Acetaminophen] Itching   Hydralazine Other (See Comments)    Hair loss   Red Dye Itching   Percocet [Oxycodone-Acetaminophen] Rash and Other (See Comments)    Pt states, "this gives her a rash, but at home she takes oxycodone for pain relief"    Tyloxapol Itching and Rash    REVIEW OF SYSTEMS:   Review of Systems  Constitutional:  Negative for chills, fatigue and fever.  HENT:   Negative for lump/mass, mouth sores, nosebleeds, sore throat and trouble swallowing.   Eyes:  Negative for eye problems.  Respiratory:  Negative for cough and shortness of breath.   Cardiovascular:  Negative for chest pain, leg swelling and palpitations.  Gastrointestinal:  Positive for nausea. Negative for abdominal pain, constipation, diarrhea and vomiting.  Genitourinary:  Negative for bladder incontinence, difficulty urinating, dysuria, frequency, hematuria and nocturia.   Musculoskeletal:  Negative for arthralgias, back pain, flank pain, myalgias and neck pain.  Skin:  Negative for itching and rash.  Neurological:  Negative for  dizziness, headaches and numbness.  Hematological:  Does not bruise/bleed easily.  Psychiatric/Behavioral:  Negative for depression, sleep disturbance and suicidal ideas. The patient is nervous/anxious.   All other systems reviewed and are negative.    VITALS:   There were no vitals taken for this visit.  Wt Readings from Last 3 Encounters:  03/02/22 123 lb 6.4 oz (56 kg)  02/22/22 126 lb 1.3 oz (57.2 kg)  02/16/22 124 lb 9.6 oz (56.5 kg)    There is no height or weight on file to calculate BMI.  Performance status (ECOG): 1 - Symptomatic but completely ambulatory  PHYSICAL EXAM:   Physical Exam Vitals and nursing note reviewed. Exam conducted with a chaperone present.  Constitutional:      Appearance: Normal appearance.  Cardiovascular:     Rate and Rhythm: Normal rate and regular rhythm.     Pulses: Normal pulses.     Heart sounds: Normal heart sounds.  Pulmonary:     Effort: Pulmonary effort is normal.     Breath sounds: Normal breath sounds.  Abdominal:     Palpations: Abdomen is soft. There is no hepatomegaly, splenomegaly or mass.     Tenderness: There is no abdominal tenderness.     Comments: Spleen palpable 5-6 fingers below left costal margin   Musculoskeletal:     Right lower leg: No edema.     Left lower leg: No edema.  Lymphadenopathy:     Cervical: No cervical adenopathy.     Right cervical: No superficial, deep or posterior cervical adenopathy.    Left cervical: No superficial, deep or posterior cervical adenopathy.     Upper Body:     Right upper body: No supraclavicular or axillary adenopathy.     Left upper body: No supraclavicular or axillary adenopathy.  Neurological:     General: No focal deficit present.     Mental Status: She is alert and oriented to person, place, and time.  Psychiatric:        Mood and Affect: Mood normal.        Behavior: Behavior normal.     LABS:      Latest Ref Rng & Units 03/28/2022    2:34 PM 03/02/2022    9:51 AM  02/15/2022   11:05 AM  CBC  WBC 4.0 - 10.5 K/uL 55.5  85.8  113.4   Hemoglobin 12.0 - 15.0 g/dL 7.7  10.5  7.7   Hematocrit 36.0 - 46.0 % 23.8  32.8  24.6   Platelets 150 - 400 K/uL 122  169  190       Latest Ref Rng & Units 03/28/2022    2:34 PM 03/02/2022    9:51 AM 02/16/2022   10:00 AM  CMP  Glucose 70 - 99 mg/dL 103  113  91   BUN 8 - 23 mg/dL 39  33  29   Creatinine 0.44 - 1.00 mg/dL 2.34  1.82  1.74   Sodium 135 - 145 mmol/L 134  137  139   Potassium 3.5 - 5.1 mmol/L 4.6  3.8  4.1   Chloride 98 - 111 mmol/L 108  105  107   CO2 22 - 32 mmol/L 19  24  24   $ Calcium 8.9 - 10.3 mg/dL 8.6  8.8  8.6   Total Protein 6.5 - 8.1 g/dL 8.1  9.0  7.6   Total Bilirubin 0.3 - 1.2 mg/dL 0.6  1.1  0.8   Alkaline Phos 38 - 126 U/L 138  189  145   AST 15 - 41 U/L 32  42  31   ALT 0 - 44 U/L 25  26  16      $ No results found for: "CEA1", "CEA" / No results found for: "CEA1", "CEA" No results found for: "PSA1" No results found for: "[id number]" No results found for: "CAN125"  No results found for: "TOTALPROTELP", "ALBUMINELP", "A1GS", "A2GS", "BETS", "BETA2SER", "GAMS", "MSPIKE", "SPEI" Lab Results  Component Value Date   TIBC 329 08/25/2015   TIBC 336 02/21/2011   FERRITIN 26 08/25/2015   FERRITIN 81 02/21/2011   IRONPCTSAT 8 (L) 08/25/2015   IRONPCTSAT 23 02/21/2011   Lab Results  Component Value Date   LDH 637 (H) 03/28/2022   LDH 939 (H) 03/02/2022   LDH 934 (H) 01/18/2022     STUDIES:   No results found.

## 2022-03-28 NOTE — Patient Instructions (Signed)
[facility]  Discharge Instructions: Thank you for choosing [facility] to provide your oncology and hematology care.  If you have a lab appointment with the [facility], please come in thru the Main Entrance and check in at the main information desk.  Wear comfortable clothing and clothing appropriate for easy access to any Portacath or PICC line.   We strive to give you quality time with your provider. You may need to reschedule your appointment if you arrive late (15 or more minutes).  Arriving late affects you and other patients whose appointments are after yours.  Also, if you miss three or more appointments without notifying the office, you may be dismissed from the clinic at the provider's discretion.      For prescription refill requests, have your pharmacy contact our office and allow 72 hours for refills to be completed.    Today you received the following chemotherapy and/or immunotherapy agents Port flush      To help prevent nausea and vomiting after your treatment, we encourage you to take your nausea medication as directed.  BELOW ARE SYMPTOMS THAT SHOULD BE REPORTED IMMEDIATELY: *FEVER GREATER THAN 100.4 F (38 C) OR HIGHER *CHILLS OR SWEATING *NAUSEA AND VOMITING THAT IS NOT CONTROLLED WITH YOUR NAUSEA MEDICATION *UNUSUAL SHORTNESS OF BREATH *UNUSUAL BRUISING OR BLEEDING *URINARY PROBLEMS (pain or burning when urinating, or frequent urination) *BOWEL PROBLEMS (unusual diarrhea, constipation, pain near the anus) TENDERNESS IN MOUTH AND THROAT WITH OR WITHOUT PRESENCE OF ULCERS (sore throat, sores in mouth, or a toothache) UNUSUAL RASH, SWELLING OR PAIN  UNUSUAL VAGINAL DISCHARGE OR ITCHING   Items with * indicate a potential emergency and should be followed up as soon as possible or go to the Emergency Department if any problems should occur.  Please show the CHEMOTHERAPY ALERT CARD or IMMUNOTHERAPY ALERT CARD at check-in to the  Emergency Department and triage nurse.  Should you have questions after your visit or need to cancel or reschedule your appointment, please contact [facility] [phone]  and follow the prompts.  Office hours are 8:00 a.m. to 4:30 p.m. Monday - Friday. Please note that voicemails left after 4:00 p.m. may not be returned until the following business day.  We are closed weekends and major holidays. You have access to a nurse at all times for urgent questions. Please call the main number to the clinic [phone] and follow the prompts.  For any non-urgent questions, you may also contact your provider using MyChart. We now offer e-Visits for anyone 70 and older to request care online for non-urgent symptoms. For details visit [email or web address].   Also download the MyChart app! Go to the app store, search "MyChart", open the app, select [facility], and log in with your MyChart username and password.

## 2022-03-28 NOTE — Addendum Note (Signed)
Addended by: Benjiman Core D on: 03/28/2022 04:12 PM   Modules accepted: Orders

## 2022-03-28 NOTE — Patient Instructions (Addendum)
[place]  Discharge Instructions  You were seen and examined today by [name].  [name] discussed your most recent lab work which revealed that your hemoglobin is low. [name] is ordering 1 unit of blood to help build your hemoglobin so you won't feel so fatigued. Your creatinine is elevated but this could be due to the recent antibiotic you took. Your other labs are looking stable and some have improved.    Follow-up as scheduled in 3 weeks with labs.    Thank you for choosing [facility] to provide your oncology and hematology care.   To afford each patient quality time with our provider, please arrive at least 15 minutes before your scheduled appointment time. You may need to reschedule your appointment if you arrive late (10 or more minutes). Arriving late affects you and other patients whose appointments are after yours.  Also, if you miss three or more appointments without notifying the office, you may be dismissed from the clinic at the provider's discretion.    Again, thank you for choosing [facility].  Our hope is that these requests will decrease the amount of time that you wait before being seen by our physicians.   If you have a lab appointment with the [facility] please come in thru the Main Entrance and check in at the main information desk.           _____________________________________________________________  Should you have questions after your visit to [facility], please contact our office at [phone] and follow the prompts.  Our office hours are 8:00 a.m. to 4:30 p.m. Monday - Thursday and 8:00 a.m. to 2:30 p.m. Friday.  Please note that voicemails left after 4:00 p.m. may not be returned until the following business day.  We are closed weekends and all major holidays.  You do have access to a nurse 24-7, just call the main number to the clinic [phone]  and do not press any options, hold on the line and a nurse will answer the phone.    For prescription refill requests, have your pharmacy contact our office and allow 72 hours.    Masks are optional in the cancer centers. If you would like for your care team to wear a mask while they are taking care of you, please let them know. You may have one support person who is at least 77 years old accompany you for your appointments.

## 2022-03-28 NOTE — Progress Notes (Signed)
Patients port flushed without difficulty.  Good blood return noted with no bruising or swelling noted at site.  Band aid applied.  VSS with discharge and left in satisfactory condition with no s/s of distress noted.   

## 2022-03-29 ENCOUNTER — Ambulatory Visit (INDEPENDENT_AMBULATORY_CARE_PROVIDER_SITE_OTHER): Payer: Medicare Other | Admitting: Urology

## 2022-03-29 ENCOUNTER — Encounter: Payer: Self-pay | Admitting: Urology

## 2022-03-29 VITALS — BP 108/59 | HR 56

## 2022-03-29 DIAGNOSIS — N952 Postmenopausal atrophic vaginitis: Secondary | ICD-10-CM

## 2022-03-29 DIAGNOSIS — R32 Unspecified urinary incontinence: Secondary | ICD-10-CM

## 2022-03-29 LAB — BLADDER SCAN AMB NON-IMAGING: Scan Result: 320

## 2022-03-29 MED ORDER — ESTRADIOL 0.1 MG/GM VA CREA: TOPICAL_CREAM | VAGINAL | 3 refills | Status: DC

## 2022-03-29 NOTE — Progress Notes (Signed)
H&P  Chief Complaint: Urinary tract infections, slow urination  History of Present Illness: Renee Waters is a 77 y.o. year old female sent by Dr. Moshe Cipro for evaluation and management of recurring urinary tract infections and feeling of incomplete emptying.  She is treated for myelofibrosis by [name].  She is on oxycodone 15 mg twice a day.  She does not have constipation.  However, she will have a slow stream and feeling of incomplete emptying.  Minimal stress urinary incontinence.  She has had recurring infections.  Typically this presents is frequency and urgency as well as dysuria.  No febrile UTIs.  The symptoms have been going on for about 8 to 10 months.  She denies prior urologic evaluation.  Past Medical History:  Diagnosis Date   Acute cholangitis    Allergy    Anemia    Anxiety    Arthritis    Phreesia 03/18/2020   Barrett's esophagus    Cataract    Chronic back pain    Chronic neck pain    CKD (chronic kidney disease) stage 3, GFR 30-59 ml/min ([place]) 10/29/2020   Depression    Genital herpes    GERD (gastroesophageal reflux disease)    H/O degenerative disc disease    History of blood transfusion    Hypertension    Hypoxia 12/01/2021   Insomnia    Lupus (systemic lupus erythematosus) ([place])    Neuromuscular disorder ([place])    Osteoarthritis    S/P colonoscopy 07/2003   normal, no polyps   S/P endoscopy June 2005, Oct 2009   2005: short-segment Barrett's, 2009: short-segment Barrett's   Upper respiratory tract infection 07/09/2020   UTI (lower urinary tract infection) 11/2012    Past Surgical History:  Procedure Laterality Date   ABDOMINAL HYSTERECTOMY     BACK SURGERY     BIOPSY N/A 03/20/2014   Procedure: BIOPSY;  Surgeon: Daneil Dolin, MD;  Location: [facility];  Service: Endoscopy;  Laterality: N/A;   BIOPSY  09/14/2015   Procedure: BIOPSY;  Surgeon: Daneil Dolin, MD;  Location: [facility];  Service: Endoscopy;;  esophageal and gastric   BIOPSY   07/23/2019   Procedure: BIOPSY;  Surgeon: Daneil Dolin, MD;  Location: [facility];  Service: Endoscopy;;  esophageal    CARPAL TUNNEL RELEASE Left 2013   cervical disectomy  2002   CESAREAN SECTION N/A    Phreesia 03/18/2020   CHOLECYSTECTOMY     with lysis of adhesions for sbo; "ruptured gallbladder".   COLONOSCOPY  11/09/2011   RMR: Melanosis coli   COLONOSCOPY WITH PROPOFOL N/A 09/14/2015   Dr. Gala Romney: diverticulosis, 47m TA removed. next TCS 09/2020.    COLONOSCOPY WITH PROPOFOL N/A 04/30/2020   Procedure: COLONOSCOPY WITH PROPOFOL;  Surgeon: RDaneil Dolin MD;  [facility];  Service: Endoscopy;  Laterality: N/A;  PM (ASA 3)   DENTAL SURGERY  11/2015   multiple tooth extraction   ESOPHAGOGASTRODUODENOSCOPY  11/29/2007   salmon-colored  tongue   longest stable at  3 cm, distal esophagus as described previously status post biopsy/ Hiatal hernia, otherwise normal stomach D1 and D2   ESOPHAGOGASTRODUODENOSCOPY  01/06/11   short segment Barrett's esophagus s/p bx/Hiatal hernia   ESOPHAGOGASTRODUODENOSCOPY (EGD) WITH PROPOFOL N/A 03/20/2014   [id number] esophagus short segment barrett's, bx with no dysplasia. next egd in 03/2017   ESOPHAGOGASTRODUODENOSCOPY (EGD) WITH PROPOFOL N/A 09/14/2015   Dr. RGala Romney Barrett's without dysplasia, gastritis benign bx, hiatal hernia. next EGD 09/2018.  ESOPHAGOGASTRODUODENOSCOPY (EGD) WITH PROPOFOL N/A 07/23/2019   Procedure: ESOPHAGOGASTRODUODENOSCOPY (EGD) WITH PROPOFOL;  Surgeon: Daneil Dolin, MD;  [facility];  Service: Endoscopy;  Laterality: N/A;  3:00pm   EYE SURGERY N/A    Phreesia 03/18/2020   HERNIA REPAIR Right 07/2010   Dr. Zada Girt   IR IMAGING GUIDED PORT INSERTION  02/09/2021   JOINT REPLACEMENT     LAPAROSCOPIC CHOLECYSTECTOMY  2017   at [facility]   POLYPECTOMY  09/14/2015   Procedure: POLYPECTOMY;  Surgeon: Daneil Dolin, MD;  Location[facility];  Service: Endoscopy;;  ascending colon   right hip  replacement  07/2010   went back in sept 2012 to fix   SHOULDER ARTHROSCOPY  2008   left   SPINE SURGERY N/A    Phreesia 03/18/2020   TOTAL HIP REVISION Right 12/17/2012   Procedure: RIGHT TOTAL HIP REVISION;  Surgeon: Mauri Pole, MD;  Location: [facility];  Service: Orthopedics;  Laterality: Right;   WRIST SURGERY Right 2011   open reduction right wrist.    Home Medications:  (Not in a hospital admission)   Allergies:  Allergies  Allergen Reactions   Bee Venom Swelling and Hives   Norvasc [Amlodipine] Other (See Comments)    Hair loss   Tylenol [Acetaminophen] Itching   Hydralazine Other (See Comments)    Hair loss   Red Dye Itching   Percocet [Oxycodone-Acetaminophen] Rash and Other (See Comments)    Pt states, "this gives her a rash, but at home she takes oxycodone for pain relief"    Tyloxapol Itching and Rash    Family History  Problem Relation Age of Onset   Hypertension Mother    Stroke Mother    Colon cancer Neg Hx    Anesthesia problems Neg Hx    Hypotension Neg Hx    Malignant hyperthermia Neg Hx    Pseudochol deficiency Neg Hx    Gastric cancer Neg Hx    Esophageal cancer Neg Hx     Social History:  reports that she quit smoking about 19 years ago. Her smoking use included cigarettes. She has a 6.25 pack-year smoking history. She has never used smokeless tobacco. She reports that she does not drink alcohol and does not use drugs.  ROS: A complete review of systems was performed.  All systems are negative except for pertinent findings as noted.  Physical Exam:  Vital signs in last 24 hours: @VSRANGES$ @ General:  Alert and oriented, No acute distress HEENT: Normocephalic, atraumatic Neck: No JVD or lymphadenopathy Cardiovascular: Regular rate  Lungs: Normal inspiratory/expiratory excursion Extremities: No edema Neurologic: Grossly intact    I have reviewed notes from referring/previous physicians  I have reviewed urinalysis results--for the  past 6 months all results reviewed  I have independently reviewed prior imaging--CT images from 2022 reviewed.  Renal appears normal.  Bladder was full.  I have reviewed prior urine culture--last culture was positive for sensitive E. coli.  Impression/Assessment:  Recurrent urinary tract infections-probably secondary to vaginal atrophic changes  Incomplete bladder emptying.  She did not give urine specimen today.  This might be secondary to her opioid use  Plan:  1.  I did recommend starting estrogen cream, continue the mannose that she is using, drink more water, use probiotics and double void twice a day-morning and bedtime  2.  I will have her come back to see me in about 2 months to recheck symptoms  Jorja Loa 03/29/2022, 8:15 AM  Annie Main  Faythe Casa MD

## 2022-03-29 NOTE — Progress Notes (Signed)
Pt here today for bladder scan. Bladder was scanned and 320 was visualized.    Performed by Marisue Brooklyn, CMA

## 2022-03-30 ENCOUNTER — Inpatient Hospital Stay: Payer: Medicare Other

## 2022-03-30 VITALS — BP 149/81 | HR 59 | Temp 97.9°F | Resp 18

## 2022-03-30 DIAGNOSIS — E876 Hypokalemia: Secondary | ICD-10-CM | POA: Diagnosis not present

## 2022-03-30 DIAGNOSIS — R630 Anorexia: Secondary | ICD-10-CM | POA: Diagnosis not present

## 2022-03-30 DIAGNOSIS — N183 Chronic kidney disease, stage 3 unspecified: Secondary | ICD-10-CM | POA: Diagnosis not present

## 2022-03-30 DIAGNOSIS — D649 Anemia, unspecified: Secondary | ICD-10-CM

## 2022-03-30 DIAGNOSIS — Z79899 Other long term (current) drug therapy: Secondary | ICD-10-CM | POA: Diagnosis not present

## 2022-03-30 DIAGNOSIS — L299 Pruritus, unspecified: Secondary | ICD-10-CM | POA: Diagnosis not present

## 2022-03-30 DIAGNOSIS — D471 Chronic myeloproliferative disease: Secondary | ICD-10-CM | POA: Diagnosis not present

## 2022-03-30 MED ORDER — HEPARIN SOD (PORK) LOCK FLUSH 100 UNIT/ML IV SOLN
500.0000 [IU] | Freq: Every day | INTRAVENOUS | Status: AC | PRN
  Administered 2022-03-30: 500 [IU]

## 2022-03-30 MED ORDER — SODIUM CHLORIDE 0.9% FLUSH
10.0000 mL | INTRAVENOUS | Status: AC | PRN
  Administered 2022-03-30: 10 mL

## 2022-03-30 MED ORDER — SODIUM CHLORIDE 0.9% IV SOLUTION
250.0000 mL | Freq: Once | INTRAVENOUS | Status: AC
  Administered 2022-03-30: 250 mL via INTRAVENOUS

## 2022-03-30 MED ORDER — ACETAMINOPHEN 325 MG PO TABS
650.0000 mg | ORAL_TABLET | Freq: Once | ORAL | Status: AC
  Administered 2022-03-30: 650 mg via ORAL
  Filled 2022-03-30: qty 2

## 2022-03-30 MED ORDER — SODIUM CHLORIDE 0.9 % IV SOLN: INTRAVENOUS | Status: DC

## 2022-03-30 MED ORDER — DIPHENHYDRAMINE HCL 25 MG PO CAPS
25.0000 mg | ORAL_CAPSULE | Freq: Once | ORAL | Status: AC
  Administered 2022-03-30: 25 mg via ORAL
  Filled 2022-03-30: qty 1

## 2022-03-30 NOTE — Progress Notes (Signed)
Patient tolerated therapy with no complaints voiced. Side effects with management reviewed with understanding verbalized. Port site clean and dry with no bruising or swelling noted at site. Good blood return noted before and after administration of therapy. Band aid applied. Patient left in satisfactory condition with VSS and no s/s of distress noted.

## 2022-03-30 NOTE — Patient Instructions (Signed)
[place]  Discharge Instructions: Thank you for choosing [facility] to provide your oncology and hematology care.  If you have a lab appointment with the [facility], please come in thru the Main Entrance and check in at the main information desk.  Wear comfortable clothing and clothing appropriate for easy access to any Portacath or PICC line.   We strive to give you quality time with your provider. You may need to reschedule your appointment if you arrive late (15 or more minutes).  Arriving late affects you and other patients whose appointments are after yours.  Also, if you miss three or more appointments without notifying the office, you may be dismissed from the clinic at the provider's discretion.      For prescription refill requests, have your pharmacy contact our office and allow 72 hours for refills to be completed.    Today you received the following 1 unit of PRBCs, and 500 ml of normal saline, return as scheduled.   To help prevent nausea and vomiting after your treatment, we encourage you to take your nausea medication as directed.  BELOW ARE SYMPTOMS THAT SHOULD BE REPORTED IMMEDIATELY: *FEVER GREATER THAN 100.4 F (38 C) OR HIGHER *CHILLS OR SWEATING *NAUSEA AND VOMITING THAT IS NOT CONTROLLED WITH YOUR NAUSEA MEDICATION *UNUSUAL SHORTNESS OF BREATH *UNUSUAL BRUISING OR BLEEDING *URINARY PROBLEMS (pain or burning when urinating, or frequent urination) *BOWEL PROBLEMS (unusual diarrhea, constipation, pain near the anus) TENDERNESS IN MOUTH AND THROAT WITH OR WITHOUT PRESENCE OF ULCERS (sore throat, sores in mouth, or a toothache) UNUSUAL RASH, SWELLING OR PAIN  UNUSUAL VAGINAL DISCHARGE OR ITCHING   Items with * indicate a potential emergency and should be followed up as soon as possible or go to the Emergency Department if any problems should occur.  Please show the CHEMOTHERAPY ALERT CARD or IMMUNOTHERAPY ALERT CARD at check-in  to the Emergency Department and triage nurse.  Should you have questions after your visit or need to cancel or reschedule your appointment, please contact [facility] [phone]  and follow the prompts.  Office hours are 8:00 a.m. to 4:30 p.m. Monday - Friday. Please note that voicemails left after 4:00 p.m. may not be returned until the following business day.  We are closed weekends and major holidays. You have access to a nurse at all times for urgent questions. Please call the main number to the clinic [phone] and follow the prompts.  For any non-urgent questions, you may also contact your provider using MyChart. We now offer e-Visits for anyone 34 and older to request care online for non-urgent symptoms. For details visit [email or web address].   Also download the MyChart app! Go to the app store, search "MyChart", open the app, select [facility] [facility], and log in with your MyChart username and password.

## 2022-03-31 DIAGNOSIS — I129 Hypertensive chronic kidney disease with stage 1 through stage 4 chronic kidney disease, or unspecified chronic kidney disease: Secondary | ICD-10-CM | POA: Diagnosis not present

## 2022-03-31 DIAGNOSIS — N179 Acute kidney failure, unspecified: Secondary | ICD-10-CM | POA: Diagnosis not present

## 2022-03-31 DIAGNOSIS — N390 Urinary tract infection, site not specified: Secondary | ICD-10-CM | POA: Diagnosis not present

## 2022-03-31 DIAGNOSIS — C946 Myelodysplastic disease, not classified: Secondary | ICD-10-CM | POA: Diagnosis not present

## 2022-03-31 DIAGNOSIS — G709 Myoneural disorder, unspecified: Secondary | ICD-10-CM | POA: Diagnosis not present

## 2022-03-31 DIAGNOSIS — M329 Systemic lupus erythematosus, unspecified: Secondary | ICD-10-CM | POA: Diagnosis not present

## 2022-04-01 DIAGNOSIS — M329 Systemic lupus erythematosus, unspecified: Secondary | ICD-10-CM | POA: Diagnosis not present

## 2022-04-01 DIAGNOSIS — C946 Myelodysplastic disease, not classified: Secondary | ICD-10-CM | POA: Diagnosis not present

## 2022-04-01 DIAGNOSIS — I129 Hypertensive chronic kidney disease with stage 1 through stage 4 chronic kidney disease, or unspecified chronic kidney disease: Secondary | ICD-10-CM | POA: Diagnosis not present

## 2022-04-01 DIAGNOSIS — N179 Acute kidney failure, unspecified: Secondary | ICD-10-CM | POA: Diagnosis not present

## 2022-04-01 DIAGNOSIS — N390 Urinary tract infection, site not specified: Secondary | ICD-10-CM | POA: Diagnosis not present

## 2022-04-01 DIAGNOSIS — G709 Myoneural disorder, unspecified: Secondary | ICD-10-CM | POA: Diagnosis not present

## 2022-04-01 LAB — TYPE AND SCREEN
ABO/RH(D): O POS
Antibody Screen: POSITIVE
DAT, IgG: NEGATIVE
Donor AG Type: NEGATIVE
Unit division: 0
Unit division: 0

## 2022-04-01 LAB — BPAM RBC
Blood Product Expiration Date: 202403182359
Blood Product Expiration Date: 202403182359
ISSUE DATE / TIME: 202402211031
ISSUE DATE / TIME: 202402211031
Unit Type and Rh: 5100
Unit Type and Rh: 5100

## 2022-04-11 ENCOUNTER — Telehealth: Payer: Self-pay | Admitting: Family Medicine

## 2022-04-11 NOTE — Telephone Encounter (Signed)
Midge Minium [facility], [phone]   Called wanting to transition care to maintenance therpy once a week every 2 wks? He states she is do for re certification this week?

## 2022-04-12 NOTE — Telephone Encounter (Signed)
Verbal ok given.

## 2022-04-14 ENCOUNTER — Other Ambulatory Visit (HOSPITAL_COMMUNITY): Payer: Self-pay

## 2022-04-15 DIAGNOSIS — Z87440 Personal history of urinary (tract) infections: Secondary | ICD-10-CM | POA: Diagnosis not present

## 2022-04-15 DIAGNOSIS — D696 Thrombocytopenia, unspecified: Secondary | ICD-10-CM | POA: Diagnosis not present

## 2022-04-15 DIAGNOSIS — G4700 Insomnia, unspecified: Secondary | ICD-10-CM | POA: Diagnosis not present

## 2022-04-15 DIAGNOSIS — D631 Anemia in chronic kidney disease: Secondary | ICD-10-CM | POA: Diagnosis not present

## 2022-04-15 DIAGNOSIS — F32A Depression, unspecified: Secondary | ICD-10-CM | POA: Diagnosis not present

## 2022-04-15 DIAGNOSIS — F419 Anxiety disorder, unspecified: Secondary | ICD-10-CM | POA: Diagnosis not present

## 2022-04-15 DIAGNOSIS — N183 Chronic kidney disease, stage 3 unspecified: Secondary | ICD-10-CM | POA: Diagnosis not present

## 2022-04-15 DIAGNOSIS — M549 Dorsalgia, unspecified: Secondary | ICD-10-CM | POA: Diagnosis not present

## 2022-04-15 DIAGNOSIS — C946 Myelodysplastic disease, not classified: Secondary | ICD-10-CM | POA: Diagnosis not present

## 2022-04-15 DIAGNOSIS — K219 Gastro-esophageal reflux disease without esophagitis: Secondary | ICD-10-CM | POA: Diagnosis not present

## 2022-04-15 DIAGNOSIS — G8929 Other chronic pain: Secondary | ICD-10-CM | POA: Diagnosis not present

## 2022-04-15 DIAGNOSIS — G709 Myoneural disorder, unspecified: Secondary | ICD-10-CM | POA: Diagnosis not present

## 2022-04-15 DIAGNOSIS — Z87891 Personal history of nicotine dependence: Secondary | ICD-10-CM | POA: Diagnosis not present

## 2022-04-15 DIAGNOSIS — M329 Systemic lupus erythematosus, unspecified: Secondary | ICD-10-CM | POA: Diagnosis not present

## 2022-04-15 DIAGNOSIS — M542 Cervicalgia: Secondary | ICD-10-CM | POA: Diagnosis not present

## 2022-04-15 DIAGNOSIS — M519 Unspecified thoracic, thoracolumbar and lumbosacral intervertebral disc disorder: Secondary | ICD-10-CM | POA: Diagnosis not present

## 2022-04-15 DIAGNOSIS — I129 Hypertensive chronic kidney disease with stage 1 through stage 4 chronic kidney disease, or unspecified chronic kidney disease: Secondary | ICD-10-CM | POA: Diagnosis not present

## 2022-04-15 DIAGNOSIS — M1990 Unspecified osteoarthritis, unspecified site: Secondary | ICD-10-CM | POA: Diagnosis not present

## 2022-04-15 DIAGNOSIS — K2270 Barrett's esophagus without dysplasia: Secondary | ICD-10-CM | POA: Diagnosis not present

## 2022-04-18 ENCOUNTER — Other Ambulatory Visit (HOSPITAL_COMMUNITY): Payer: Self-pay

## 2022-04-19 DIAGNOSIS — M329 Systemic lupus erythematosus, unspecified: Secondary | ICD-10-CM | POA: Diagnosis not present

## 2022-04-19 DIAGNOSIS — C946 Myelodysplastic disease, not classified: Secondary | ICD-10-CM | POA: Diagnosis not present

## 2022-04-19 DIAGNOSIS — D631 Anemia in chronic kidney disease: Secondary | ICD-10-CM | POA: Diagnosis not present

## 2022-04-19 DIAGNOSIS — N183 Chronic kidney disease, stage 3 unspecified: Secondary | ICD-10-CM | POA: Diagnosis not present

## 2022-04-19 DIAGNOSIS — G709 Myoneural disorder, unspecified: Secondary | ICD-10-CM | POA: Diagnosis not present

## 2022-04-19 DIAGNOSIS — D47Z9 Other specified neoplasms of uncertain behavior of lymphoid, hematopoietic and related tissue: Secondary | ICD-10-CM | POA: Diagnosis not present

## 2022-04-19 DIAGNOSIS — I129 Hypertensive chronic kidney disease with stage 1 through stage 4 chronic kidney disease, or unspecified chronic kidney disease: Secondary | ICD-10-CM | POA: Diagnosis not present

## 2022-04-19 DIAGNOSIS — Z515 Encounter for palliative care: Secondary | ICD-10-CM | POA: Diagnosis not present

## 2022-04-20 ENCOUNTER — Ambulatory Visit (INDEPENDENT_AMBULATORY_CARE_PROVIDER_SITE_OTHER): Payer: Medicare Other | Admitting: Family Medicine

## 2022-04-20 ENCOUNTER — Other Ambulatory Visit (HOSPITAL_COMMUNITY): Payer: Self-pay

## 2022-04-20 ENCOUNTER — Encounter: Payer: Self-pay | Admitting: Family Medicine

## 2022-04-20 VITALS — BP 119/62 | HR 48 | Ht 65.0 in | Wt 118.0 lb

## 2022-04-20 DIAGNOSIS — F5105 Insomnia due to other mental disorder: Secondary | ICD-10-CM | POA: Diagnosis not present

## 2022-04-20 DIAGNOSIS — F331 Major depressive disorder, recurrent, moderate: Secondary | ICD-10-CM | POA: Diagnosis not present

## 2022-04-20 DIAGNOSIS — G8929 Other chronic pain: Secondary | ICD-10-CM

## 2022-04-20 DIAGNOSIS — R0989 Other specified symptoms and signs involving the circulatory and respiratory systems: Secondary | ICD-10-CM | POA: Diagnosis not present

## 2022-04-20 DIAGNOSIS — D471 Chronic myeloproliferative disease: Secondary | ICD-10-CM

## 2022-04-20 DIAGNOSIS — F418 Other specified anxiety disorders: Secondary | ICD-10-CM | POA: Diagnosis not present

## 2022-04-20 DIAGNOSIS — M5440 Lumbago with sciatica, unspecified side: Secondary | ICD-10-CM

## 2022-04-20 DIAGNOSIS — N3090 Cystitis, unspecified without hematuria: Secondary | ICD-10-CM

## 2022-04-20 DIAGNOSIS — F419 Anxiety disorder, unspecified: Secondary | ICD-10-CM | POA: Diagnosis not present

## 2022-04-20 NOTE — Patient Instructions (Addendum)
F/u in 10 to 12 weeks, call if you need me sooner  THANKFUL you feelbetter and are able to go out in the garden  No medchanges  Thanks for choosing [facility], we consider it a privelige to serve you.

## 2022-04-21 ENCOUNTER — Other Ambulatory Visit: Payer: Self-pay | Admitting: Family Medicine

## 2022-04-21 ENCOUNTER — Inpatient Hospital Stay: Payer: Medicare Other | Attending: Hematology | Admitting: Hematology

## 2022-04-21 ENCOUNTER — Encounter: Payer: Self-pay | Admitting: Hematology

## 2022-04-21 ENCOUNTER — Telehealth: Payer: Self-pay | Admitting: Family Medicine

## 2022-04-21 ENCOUNTER — Inpatient Hospital Stay: Payer: Medicare Other

## 2022-04-21 DIAGNOSIS — R162 Hepatomegaly with splenomegaly, not elsewhere classified: Secondary | ICD-10-CM | POA: Diagnosis not present

## 2022-04-21 DIAGNOSIS — D649 Anemia, unspecified: Secondary | ICD-10-CM

## 2022-04-21 DIAGNOSIS — N189 Chronic kidney disease, unspecified: Secondary | ICD-10-CM | POA: Insufficient documentation

## 2022-04-21 DIAGNOSIS — R161 Splenomegaly, not elsewhere classified: Secondary | ICD-10-CM | POA: Diagnosis not present

## 2022-04-21 DIAGNOSIS — Z806 Family history of leukemia: Secondary | ICD-10-CM | POA: Diagnosis not present

## 2022-04-21 DIAGNOSIS — Z79899 Other long term (current) drug therapy: Secondary | ICD-10-CM | POA: Diagnosis not present

## 2022-04-21 DIAGNOSIS — E876 Hypokalemia: Secondary | ICD-10-CM | POA: Insufficient documentation

## 2022-04-21 DIAGNOSIS — Z87891 Personal history of nicotine dependence: Secondary | ICD-10-CM | POA: Insufficient documentation

## 2022-04-21 DIAGNOSIS — R748 Abnormal levels of other serum enzymes: Secondary | ICD-10-CM | POA: Diagnosis not present

## 2022-04-21 DIAGNOSIS — L299 Pruritus, unspecified: Secondary | ICD-10-CM | POA: Diagnosis not present

## 2022-04-21 DIAGNOSIS — R634 Abnormal weight loss: Secondary | ICD-10-CM | POA: Insufficient documentation

## 2022-04-21 DIAGNOSIS — D471 Chronic myeloproliferative disease: Secondary | ICD-10-CM | POA: Insufficient documentation

## 2022-04-21 DIAGNOSIS — Z95828 Presence of other vascular implants and grafts: Secondary | ICD-10-CM

## 2022-04-21 LAB — CBC WITH DIFFERENTIAL/PLATELET
Abs Immature Granulocytes: 10.5 10*3/uL — ABNORMAL HIGH (ref 0.00–0.07)
Band Neutrophils: 19 %
Basophils Absolute: 0 10*3/uL (ref 0.0–0.1)
Basophils Relative: 0 %
Eosinophils Absolute: 0.7 10*3/uL — ABNORMAL HIGH (ref 0.0–0.5)
Eosinophils Relative: 1 %
HCT: 26.3 % — ABNORMAL LOW (ref 36.0–46.0)
Hemoglobin: 8.4 g/dL — ABNORMAL LOW (ref 12.0–15.0)
Lymphocytes Relative: 13 %
Lymphs Abs: 9.1 10*3/uL — ABNORMAL HIGH (ref 0.7–4.0)
MCH: 30.4 pg (ref 26.0–34.0)
MCHC: 31.9 g/dL (ref 30.0–36.0)
MCV: 95.3 fL (ref 80.0–100.0)
Metamyelocytes Relative: 6 %
Monocytes Absolute: 5.6 10*3/uL — ABNORMAL HIGH (ref 0.1–1.0)
Monocytes Relative: 8 %
Myelocytes: 8 %
Neutro Abs: 44.3 10*3/uL — ABNORMAL HIGH (ref 1.7–7.7)
Neutrophils Relative %: 44 %
Platelets: 245 10*3/uL (ref 150–400)
Promyelocytes Relative: 1 %
RBC: 2.76 MIL/uL — ABNORMAL LOW (ref 3.87–5.11)
RDW: 22.5 % — ABNORMAL HIGH (ref 11.5–15.5)
WBC: 70.3 10*3/uL (ref 4.0–10.5)
nRBC: 4 /100 WBC — ABNORMAL HIGH
nRBC: 6.2 % — ABNORMAL HIGH (ref 0.0–0.2)

## 2022-04-21 LAB — SAMPLE TO BLOOD BANK

## 2022-04-21 LAB — COMPREHENSIVE METABOLIC PANEL
ALT: 20 U/L (ref 0–44)
AST: 34 U/L (ref 15–41)
Albumin: 4.1 g/dL (ref 3.5–5.0)
Alkaline Phosphatase: 135 U/L — ABNORMAL HIGH (ref 38–126)
Anion gap: 9 (ref 5–15)
BUN: 38 mg/dL — ABNORMAL HIGH (ref 8–23)
CO2: 20 mmol/L — ABNORMAL LOW (ref 22–32)
Calcium: 8.5 mg/dL — ABNORMAL LOW (ref 8.9–10.3)
Chloride: 104 mmol/L (ref 98–111)
Creatinine, Ser: 1.72 mg/dL — ABNORMAL HIGH (ref 0.44–1.00)
GFR, Estimated: 30 mL/min — ABNORMAL LOW (ref >60)
Glucose, Bld: 105 mg/dL — ABNORMAL HIGH (ref 70–99)
Potassium: 4.2 mmol/L (ref 3.5–5.1)
Sodium: 133 mmol/L — ABNORMAL LOW (ref 135–145)
Total Bilirubin: 0.8 mg/dL (ref 0.3–1.2)
Total Protein: 8.2 g/dL — ABNORMAL HIGH (ref 6.5–8.1)

## 2022-04-21 LAB — MAGNESIUM: Magnesium: 2.1 mg/dL (ref 1.7–2.4)

## 2022-04-21 LAB — LACTATE DEHYDROGENASE: LDH: 832 U/L — ABNORMAL HIGH (ref 98–192)

## 2022-04-21 MED ORDER — TEMAZEPAM 30 MG PO CAPS: 30.0000 mg | ORAL_CAPSULE | Freq: Every evening | ORAL | 5 refills | Status: DC | PRN

## 2022-04-21 MED ORDER — SODIUM CHLORIDE 0.9% FLUSH
10.0000 mL | Freq: Once | INTRAVENOUS | Status: AC
  Administered 2022-04-21: 10 mL via INTRAVENOUS

## 2022-04-21 MED ORDER — HEPARIN SOD (PORK) LOCK FLUSH 100 UNIT/ML IV SOLN
500.0000 [IU] | Freq: Once | INTRAVENOUS | Status: AC
  Administered 2022-04-21: 500 [IU] via INTRAVENOUS

## 2022-04-21 NOTE — Patient Instructions (Signed)
[place]  Discharge Instructions  You were seen and examined today by [name].  [name] discussed your most recent lab work which revealed that everything looks good that is back so far. Just waiting for the liver function results and we will call if anything is looking bad.   [name] is ordering a spleen ultrasound.   Follow-up as scheduled in 6 weeks.    Thank you for choosing [facility] to provide your oncology and hematology care.   To afford each patient quality time with our provider, please arrive at least 15 minutes before your scheduled appointment time. You may need to reschedule your appointment if you arrive late (10 or more minutes). Arriving late affects you and other patients whose appointments are after yours.  Also, if you miss three or more appointments without notifying the office, you may be dismissed from the clinic at the provider's discretion.    Again, thank you for choosing [facility].  Our hope is that these requests will decrease the amount of time that you wait before being seen by our physicians.   If you have a lab appointment with the [facility] please come in thru the Main Entrance and check in at the main information desk.           _____________________________________________________________  Should you have questions after your visit to [facility], please contact our office at [phone] and follow the prompts.  Our office hours are 8:00 a.m. to 4:30 p.m. Monday - Thursday and 8:00 a.m. to 2:30 p.m. Friday.  Please note that voicemails left after 4:00 p.m. may not be returned until the following business day.  We are closed weekends and all major holidays.  You do have access to a nurse 24-7, just call the main number to the clinic [phone] and do not press any options, hold on the line and a nurse will answer the phone.    For  prescription refill requests, have your pharmacy contact our office and allow 72 hours.    Masks are optional in the cancer centers. If you would like for your care team to wear a mask while they are taking care of you, please let them know. You may have one support person who is at least 77 years old accompany you for your appointments.

## 2022-04-21 NOTE — Telephone Encounter (Signed)
Prescription Request  04/21/2022  LOV: 04/20/2022  What is the name of the medication or equipment? temazepam (RESTORIL) 30 MG capsule [id number]   Have you contacted your pharmacy to request a refill? Yes   Which pharmacy would you like this sent to?   [facility]  Patient notified that their request is being sent to the clinical staff for review and that they should receive a response within 2 business days.   Please advise at Mobile [phone] (mobile)

## 2022-04-21 NOTE — Progress Notes (Signed)
[address] [place] [address]    Clinic Day:  04/21/2022  Referring physician: [name], MD  Patient Care Team: [name], MD as PCP - General Branch, Royetta Crochet, MD as PCP - Cardiology (Cardiology) Gala Romney [name], MD (Gastroenterology) Carole Civil, MD as Consulting Physician (Orthopedic Surgery) Derek Jack, MD as Medical Oncologist (Oncology) Brien Mates, RN as Oncology Nurse Navigator (Oncology) Barbaraann Faster, RN as [facility]   ASSESSMENT & PLAN:   Assessment:  MPL positive prefibrotic/early primary myelofibrosis: - CBC on 11/06/2020 with white count 75.6, differential 59% neutrophils, 12% monocytes, 4% lymphocytes, 1 percentage of eosinophils and basophils, 6% band neutrophils, 12% myelocytes, 1% promyelocytes, 29% blasts. - Pathologist review of blood smear reported as leukoerythroblastic reaction. - 25 pound weight loss in the last 6 months, unintentional.  Decreased appetite.  Reports fatigue for the last few months.  Reports night sweats x3 in the last 1 month. - Bone marrow biopsy on 11/18/2020 consistent with granulocytic proliferation with differential including CMML versus myeloproliferative disorder. - BCR/ABL was negative. 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 - NGS myeloid panel shows mutations in ASXL1, MPL, TET2, EZH2 mutations. - Spleen ultrasound on 12/16/2020 shows mildly enlarged measuring 12.4 x 9.6 x 7 cm with volume of 435 cc. - PDGFR alpha, beta and FGFR 1 was negative. - She was evaluated by Dr. Florene Glen at [facility].  Slides were reviewed at [facility] hematopathology.  They thought it was less likely CMML and more likely MPL positive myeloproliferative neoplasm.  Dr. Florene Glen has recommended initiate Jakafi. - Given anemia, B symptoms, cytogenetics with MPL, ASLX1, normal karyotype she is intermediate risk on  GIPPS at high risk MIPSS70+ - Ruxolitinib 10 mg twice daily started on 02/16/2021.  Dose increased to 15 mg twice daily on 05/18/2021. - CTAP on 04/07/2021: Hepatomegaly and splenomegaly measuring 14.3 cm.  No other pathology.  No spleen infarcts. -Ruxolitinib dose increased to 15 mg twice daily on 09/20/2021. - Ultrasound spleen (09/21/2021): 13.6 cm with volume 532 mL.  This is slightly improved from previous volume. - Bone marrow biopsy (09/09/2021): Material is limited but overall features consistent with myeloid neoplasm.  No increase in blasts.  Erythroid precursors appear decreased but megakaryocytes are variably evident with clusters and small forms.  Reticulin's shows variable increase in reticulin fibers including areas with prominent increase.  Chromosome analysis was normal. - Tapering doses of ruxolitinib started on 12/24/2021, last dose on 01/23/2022 - Evaluated by Dr. Florene Glen on 12/24/2021. - Momelotinib 100 mg daily started on 01/26/2022  2.  Social/family history: - She lives at home and is able to do all her ADLs and IADLs although she is getting tired lately.  She reports quitting smoking 6 months ago and smoked 1 pack/week for 43 years. - She believes that her mother had some kind of leukemia.  No other malignancies.    Plan: MPL positive myeloproliferative neoplasm: - She is taking momelotinib 100 mg daily. - Physical exam today: Spleen palpable about 5 to 6 fingerbreadths below the left costal margin.  It is not tender for the first time. - Her last blood transfusion was on 03/28/2022, 1 unit PRBC. - Labs today shows hemoglobin 8.4.  White count is 70 and platelets are normal.  LFTs are normal with mildly elevated alk phos. - Continue monitoring CBC every 2 weeks for possible transfusion. - RTC 6 weeks for  follow-up.  I plan to repeat ultrasound of the spleen along with labs.  Will also check uric acid as she is taking allopurinol daily.  2.  Decreased appetite: - Her appetite is  good and she is eating 2 meals per day.  Weight is stable around 118 pounds.  3.  CKD: - Baseline creatinine of 1.3-1.6.  Creatinine today is 1.72.  4.  Hypokalemia: - Continue potassium supplements.  Potassium today is 4.2.   5.  Generalized itching: - Overall itching has improved since momelotinib started.    Orders Placed This Encounter  Procedures   CBC with Differential/Platelet    Standing Status:   Future    Standing Expiration Date:   04/21/2023    Order Specific Question:   Release to patient    Answer:   Immediate   Comprehensive metabolic panel    Standing Status:   Future    Standing Expiration Date:   04/21/2023    Order Specific Question:   Release to patient    Answer:   Immediate   Lactate dehydrogenase    Standing Status:   Future    Standing Expiration Date:   04/21/2023    Order Specific Question:   Release to patient    Answer:   Immediate   Uric acid    Standing Status:   Future    Standing Expiration Date:   04/21/2023        Derek Jack, MD   3/14/20245:03 PM  CHIEF COMPLAINT:   Diagnosis: MPL positive myeloproliferative neoplasm  Cancer Staging  No matching staging information was found for the patient.   Prior Therapy: Jakafi 10 mg twice daily   Current Therapy: Momelotinib 100 mg daily    HISTORY OF PRESENT ILLNESS:   Oncology History   No history exists.     INTERVAL HISTORY:   Renee Waters is a 77 y.o. female seen for follow-up of myeloproliferative neoplasm.  She reports that she has been taking momelotinib 100 mg daily.  She reports energy levels of 75%.  Appetite is 50%.  Occasional nausea which is well-controlled with Compazine.  No vomiting reported.  She reports that she has been feeling really good for the last couple of weeks.  She had to receive 1 unit PRBC on 03/28/2022.  Appetite is good and she eats 2 meals per day.  PAST MEDICAL HISTORY:   Past Medical History: Past Medical History:  Diagnosis Date   Acute  cholangitis    Allergy    Anemia    Anxiety    Arthritis    Phreesia 03/18/2020   Barrett's esophagus    Cataract    Chronic back pain    Chronic neck pain    CKD (chronic kidney disease) stage 3, GFR 30-59 ml/min ([place]) 10/29/2020   Depression    Genital herpes    GERD (gastroesophageal reflux disease)    H/O degenerative disc disease    History of blood transfusion    Hypertension    Hypoxia 12/01/2021   Insomnia    Lupus (systemic lupus erythematosus) ([place])    Neuromuscular disorder ([place])    Osteoarthritis    S/P colonoscopy 07/2003   normal, no polyps   S/P endoscopy June 2005, Oct 2009   2005: short-segment Barrett's, 2009: short-segment Barrett's   Upper respiratory tract infection 07/09/2020   UTI (lower urinary tract infection) 11/2012    Surgical History: Past Surgical History:  Procedure Laterality Date   ABDOMINAL HYSTERECTOMY     BACK  SURGERY     BIOPSY N/A 03/20/2014   Procedure: BIOPSY;  Surgeon: Daneil Dolin, MD;  Location: [facility];  Service: Endoscopy;  Laterality: N/A;   BIOPSY  09/14/2015   Procedure: BIOPSY;  Surgeon: Daneil Dolin, MD;  Location: [facility];  Service: Endoscopy;;  esophageal and gastric   BIOPSY  07/23/2019   Procedure: BIOPSY;  Surgeon: Daneil Dolin, MD;  Location: [facility];  Service: Endoscopy;;  esophageal    CARPAL TUNNEL RELEASE Left 2013   cervical disectomy  2002   CESAREAN SECTION N/A    Phreesia 03/18/2020   CHOLECYSTECTOMY     with lysis of adhesions for sbo; "ruptured gallbladder".   COLONOSCOPY  11/09/2011   RMR: Melanosis coli   COLONOSCOPY WITH PROPOFOL N/A 09/14/2015   Dr. Gala Romney: diverticulosis, 43m TA removed. next TCS 09/2020.    COLONOSCOPY WITH PROPOFOL N/A 04/30/2020   Procedure: COLONOSCOPY WITH PROPOFOL;  Surgeon: RDaneil Dolin MD;  Location: [facility];  Service: Endoscopy;  Laterality: N/A;  PM (ASA 3)   DENTAL SURGERY  11/2015   multiple tooth extraction   ESOPHAGOGASTRODUODENOSCOPY   11/29/2007   salmon-colored  tongue   longest stable at  3 cm, distal esophagus as described previously status post biopsy/ Hiatal hernia, otherwise normal stomach D1 and D2   ESOPHAGOGASTRODUODENOSCOPY  01/06/11   short segment Barrett's esophagus s/p bx/Hiatal hernia   ESOPHAGOGASTRODUODENOSCOPY (EGD) WITH PROPOFOL N/A 03/20/2014   [id number] esophagus short segment barrett's, bx with no dysplasia. next egd in 03/2017   ESOPHAGOGASTRODUODENOSCOPY (EGD) WITH PROPOFOL N/A 09/14/2015   Dr. RGala Romney Barrett's without dysplasia, gastritis benign bx, hiatal hernia. next EGD 09/2018.   ESOPHAGOGASTRODUODENOSCOPY (EGD) WITH PROPOFOL N/A 07/23/2019   Procedure: ESOPHAGOGASTRODUODENOSCOPY (EGD) WITH PROPOFOL;  Surgeon: RDaneil Dolin MD;  [facility];  Service: Endoscopy;  Laterality: N/A;  3:00pm   EYE SURGERY N/A    Phreesia 03/18/2020   HERNIA REPAIR Right 07/2010   Dr. BZada Girt  IR IMAGING GUIDED PORT INSERTION  02/09/2021   JOINT REPLACEMENT     [facility]  POLYPECTOMY  09/14/2015   Procedure: POLYPECTOMY;  Surgeon: RDaneil Dolin MD;  Location: [facility];  Service: Endoscopy;;  ascending colon   right hip replacement  07/2010   went back in sept 2012 to fix   SHOULDER ARTHROSCOPY  2008   left   SPINE SURGERY N/A    Phreesia 03/18/2020   TOTAL HIP REVISION Right 12/17/2012   Procedure: RIGHT TOTAL HIP REVISION;  Surgeon: MMauri Pole MD;  Location: [facility];  Service: Orthopedics;  Laterality: Right;   WRIST SURGERY Right 2011   open reduction right wrist.    Social History: Social History   Socioeconomic History   Marital status: Married    Spouse name: louis   Number of children: 4   Years of education: 12+   Highest education level: Some college, no degree  Occupational History   Occupation: aPress photographer - retired   Occupation: non profit  Tobacco Use   Smoking status: Former    Packs/day: 0.25    Years: 25.00     Additional pack years: 0.00    Total pack years: 6.25    Types: Cigarettes    Quit date: 02/07/2003    Years since quitting: 19.2   Smokeless tobacco: Never   Tobacco comments:    quit in 2004  Vaping Use  Vaping Use: Never used  Substance and Sexual Activity   Alcohol use: No   Drug use: No   Sexual activity: Not Currently    Birth control/protection: Surgical  Other Topics Concern   Not on file  Social History Narrative   Not on file   Social Determinants of Health   Financial Resource Strain: Low Risk  (12/28/2020)   Overall Financial Resource Strain (CARDIA)    Difficulty of Paying Living Expenses: Not hard at all  Food Insecurity: No Food Insecurity (02/08/2022)   Hunger Vital Sign    Worried About Running Out of Food in the Last Year: Never true    Ran Out of Food in the Last Year: Never true  Transportation Needs: No Transportation Needs (02/11/2022)   PRAPARE - Hydrologist (Medical): No    Lack of Transportation (Non-Medical): No  Physical Activity: Inactive (04/23/2021)   Exercise Vital Sign    Days of Exercise per Week: 0 days    Minutes of Exercise per Session: 0 min  Stress: Stress Concern Present (04/23/2021)   [place]    Feeling of Stress : Rather much  Social Connections: Moderately Integrated (12/28/2020)   Social Connection and Isolation Panel [NHANES]    Frequency of Communication with Friends and Family: More than three times a week    Frequency of Social Gatherings with Friends and Family: Once a week    Attends Religious Services: More than 4 times per year    Active Member of [employer] or Organizations: No    Attends Archivist Meetings: Never    Marital Status: Married  Human resources officer Violence: Not At Risk (02/08/2022)   Humiliation, Afraid, Rape, and Kick questionnaire    Fear of Current or Ex-Partner: No    Emotionally Abused: No    Physically  Abused: No    Sexually Abused: No    Family History: Family History  Problem Relation Age of Onset   Hypertension Mother    Stroke Mother    Colon cancer Neg Hx    Anesthesia problems Neg Hx    Hypotension Neg Hx    Malignant hyperthermia Neg Hx    Pseudochol deficiency Neg Hx    Gastric cancer Neg Hx    Esophageal cancer Neg Hx     Current Medications:  Current Outpatient Medications:    acyclovir (ZOVIRAX) 800 MG tablet, Take 1 tablet (800 mg total) by mouth 5 (five) times daily as needed., Disp: 50 tablet, Rfl: 1   acyclovir ointment (ZOVIRAX) 5 %, Apply 1 Application topically every 3 (three) hours as needed., Disp: 15 g, Rfl: 1   allopurinol 200 MG TABS, Take 200 mg by mouth daily., Disp: 30 tablet, Rfl: 0   busPIRone (BUSPAR) 5 MG tablet, Take 1 tablet (5 mg total) by mouth daily as needed (anxiety). Take one tablet by mouth once daily, as needed, for anxiety, Disp: , Rfl:    cetirizine (ZYRTEC) 10 MG tablet, Take 1 tablet by mouth once daily, Disp: 30 tablet, Rfl: 0   citalopram (CELEXA) 20 MG tablet, Take 1 tablet by mouth once daily, Disp: 30 tablet, Rfl: 0   EPINEPHrine 0.3 mg/0.3 mL IJ SOAJ injection, INJECT 0.3 MLS INTO MUSCLE ONCE AS NEEDED, Disp: 1 each, Rfl: 2   furosemide (LASIX) 20 MG tablet, TAKE 1 TABLET BY MOUTH ONCE DAILY AS NEEDED, Disp: 30 tablet, Rfl: 3   hydrOXYzine (ATARAX) 25 MG tablet,  Take 12.5-25 mg by mouth 2 (two) times daily as needed., Disp: , Rfl:    lidocaine-prilocaine (EMLA) cream, Apply 1 Application topically as needed (Access to port and pain)., Disp: 30 g, Rfl: 6   metoprolol tartrate (LOPRESSOR) 25 MG tablet, Take one and a half tablets twice daily, Disp: 180 tablet, Rfl: 3   mirtazapine (REMERON) 15 MG tablet, Take 1 tablet (15 mg total) by mouth at bedtime., Disp: 30 tablet, Rfl: 5   momelotinib dihydrochloride (OJJAARA) 100 MG tablet, Take 1 tablet (100 mg) by mouth daily., Disp: 30 tablet, Rfl: 6   Momelotinib Dihydrochloride (OJJAARA)  100 MG TABS, Take 100 mg by mouth daily., Disp: , Rfl:    Multiple Vitamin (MULTIVITAMIN WITH MINERALS) TABS tablet, Take 1 tablet by mouth daily., Disp: , Rfl:    ondansetron (ZOFRAN-ODT) 8 MG disintegrating tablet, Take 1 tablet (8 mg total) by mouth every 8 (eight) hours as needed for nausea or vomiting., Disp: 20 tablet, Rfl: 0   oxyCODONE (ROXICODONE) 15 MG immediate release tablet, Take 15 mg by mouth every 6 (six) hours as needed., Disp: , Rfl:    pantoprazole (PROTONIX) 40 MG tablet, TAKE TABELT BY MOUTH 30-60 MINUTES BEFORE YOUR FIRST AND LAST MEALS OF THE DAY, Disp: 60 tablet, Rfl: 2   potassium chloride (KLOR-CON) 10 MEQ tablet, Take 1 tablet by mouth once daily, Disp: 30 tablet, Rfl: 4   prochlorperazine (COMPAZINE) 10 MG tablet, Take 1 tablet (10 mg total) by mouth every 6 (six) hours as needed for nausea or vomiting., Disp: 60 tablet, Rfl: 1   temazepam (RESTORIL) 30 MG capsule, Take 1 capsule (30 mg total) by mouth at bedtime as needed for sleep., Disp: 30 capsule, Rfl: 5   tiZANidine (ZANAFLEX) 4 MG tablet, Take by mouth., Disp: , Rfl:  No current facility-administered medications for this visit.  Facility-Administered Medications Ordered in Other Visits:    lanreotide acetate (SOMATULINE DEPOT) 120 MG/0.5ML injection, , , ,    lanreotide acetate (SOMATULINE DEPOT) 120 MG/0.5ML injection, , , ,    lanreotide acetate (SOMATULINE DEPOT) 120 MG/0.5ML injection, , , ,    octreotide (SANDOSTATIN LAR) 30 MG IM injection, , , ,    octreotide (SANDOSTATIN LAR) 30 MG IM injection, , , ,    Allergies: Allergies  Allergen Reactions   Bee Venom Swelling and Hives   Norvasc [Amlodipine] Other (See Comments)    Hair loss   Tylenol [Acetaminophen] Itching   Hydralazine Other (See Comments)    Hair loss   Red Dye Itching   Percocet [Oxycodone-Acetaminophen] Rash and Other (See Comments)    Pt states, "this gives her a rash, but at home she takes oxycodone for pain relief"    Tyloxapol  Itching and Rash    REVIEW OF SYSTEMS:   Review of Systems  Constitutional:  Negative for chills, fatigue and fever.  HENT:   Negative for lump/mass, mouth sores, nosebleeds, sore throat and trouble swallowing.   Eyes:  Negative for eye problems.  Respiratory:  Negative for cough and shortness of breath.   Cardiovascular:  Negative for chest pain, leg swelling and palpitations.  Gastrointestinal:  Positive for nausea. Negative for abdominal pain, constipation, diarrhea and vomiting.  Genitourinary:  Negative for bladder incontinence, difficulty urinating, dysuria, frequency, hematuria and nocturia.   Musculoskeletal:  Negative for arthralgias, back pain, flank pain, myalgias and neck pain.  Skin:  Positive for itching. Negative for rash.  Neurological:  Negative for dizziness, headaches and numbness.  Hematological:  Does not bruise/bleed easily.  Psychiatric/Behavioral:  Positive for sleep disturbance. Negative for depression and suicidal ideas. The patient is nervous/anxious.   All other systems reviewed and are negative.    VITALS:   There were no vitals taken for this visit.  Wt Readings from Last 3 Encounters:  04/20/22 118 lb (53.5 kg)  03/02/22 123 lb 6.4 oz (56 kg)  02/22/22 126 lb 1.3 oz (57.2 kg)    There is no height or weight on file to calculate BMI.  Performance status (ECOG): 1 - Symptomatic but completely ambulatory  PHYSICAL EXAM:   Physical Exam Vitals and nursing note reviewed. Exam conducted with a chaperone present.  Constitutional:      Appearance: Normal appearance.  Cardiovascular:     Rate and Rhythm: Normal rate and regular rhythm.     Pulses: Normal pulses.     Heart sounds: Normal heart sounds.  Pulmonary:     Effort: Pulmonary effort is normal.     Breath sounds: Normal breath sounds.  Abdominal:     Palpations: Abdomen is soft. There is no hepatomegaly, splenomegaly or mass.     Tenderness: There is no abdominal tenderness.     Comments:  Spleen palpable 5-6 fingers below left costal margin   Musculoskeletal:     Right lower leg: No edema.     Left lower leg: No edema.  Lymphadenopathy:     Cervical: No cervical adenopathy.     Right cervical: No superficial, deep or posterior cervical adenopathy.    Left cervical: No superficial, deep or posterior cervical adenopathy.     Upper Body:     Right upper body: No supraclavicular or axillary adenopathy.     Left upper body: No supraclavicular or axillary adenopathy.  Neurological:     General: No focal deficit present.     Mental Status: She is alert and oriented to person, place, and time.  Psychiatric:        Mood and Affect: Mood normal.        Behavior: Behavior normal.     LABS:      Latest Ref Rng & Units 04/21/2022    1:52 PM 03/28/2022    2:34 PM 03/02/2022    9:51 AM  CBC  WBC 4.0 - 10.5 K/uL 70.3  55.5  85.8   Hemoglobin 12.0 - 15.0 g/dL 8.4  7.7  10.5   Hematocrit 36.0 - 46.0 % 26.3  23.8  32.8   Platelets 150 - 400 K/uL 245  122  169       Latest Ref Rng & Units 04/21/2022    1:52 PM 03/28/2022    2:34 PM 03/02/2022    9:51 AM  CMP  Glucose 70 - 99 mg/dL 105  103  113   BUN 8 - 23 mg/dL 38  39  33   Creatinine 0.44 - 1.00 mg/dL 1.72  2.34  1.82   Sodium 135 - 145 mmol/L 133  134  137   Potassium 3.5 - 5.1 mmol/L 4.2  4.6  3.8   Chloride 98 - 111 mmol/L 104  108  105   CO2 22 - 32 mmol/L '20  19  24   '$ Calcium 8.9 - 10.3 mg/dL 8.5  8.6  8.8   Total Protein 6.5 - 8.1 g/dL 8.2  8.1  9.0   Total Bilirubin 0.3 - 1.2 mg/dL 0.8  0.6  1.1   Alkaline Phos 38 - 126 U/L 135  138  189   AST 15 - 41 U/L 34  32  42   ALT 0 - 44 U/L '20  25  26      '$ No results found for: "CEA1", "CEA" / No results found for: "CEA1", "CEA" No results found for: "PSA1" No results found for: "[id number]" No results found for: "CAN125"  No results found for: "TOTALPROTELP", "ALBUMINELP", "A1GS", "A2GS", "BETS", "BETA2SER", "GAMS", "MSPIKE", "SPEI" Lab Results  Component Value Date    TIBC 329 08/25/2015   TIBC 336 02/21/2011   FERRITIN 26 08/25/2015   FERRITIN 81 02/21/2011   IRONPCTSAT 8 (L) 08/25/2015   IRONPCTSAT 23 02/21/2011   Lab Results  Component Value Date   LDH 832 (H) 04/21/2022   LDH 637 (H) 03/28/2022   LDH 939 (H) 03/02/2022     STUDIES:   No results found.

## 2022-04-21 NOTE — Progress Notes (Signed)
Port flushed with good blood return noted. No bruising or swelling at site. Bandaid applied and patient discharged in satisfactory condition. VVS stable with no signs or symptoms of distressed noted. 

## 2022-04-24 ENCOUNTER — Encounter: Payer: Self-pay | Admitting: Family Medicine

## 2022-04-24 NOTE — Assessment & Plan Note (Signed)
Pain management through hospice

## 2022-04-24 NOTE — Assessment & Plan Note (Signed)
Stable and controlled on current regime

## 2022-04-24 NOTE — Assessment & Plan Note (Signed)
Managed by Urology

## 2022-04-24 NOTE — Assessment & Plan Note (Signed)
Controlled, no change in medication  

## 2022-04-24 NOTE — Assessment & Plan Note (Signed)
Stable and controlled, continue current meds

## 2022-04-24 NOTE — Progress Notes (Signed)
   Renee Waters     MRN: [id number]      DOB: May 05, 1945   HPI Renee Waters is here for follow up and re-evaluation of chronic medical conditions, medication management and review of any available recent lab and radiology data. Doing fairly well. Has gained some weight, meals are sent from [employer] and she prepares them for herself and her spouse. Daughters are very supportive, but neither lives in [place]. Hospice involvement appreciated and beneficial   ROS Denies recent fever or chills. Denies sinus pressure, nasal congestion, ear pain or sore throat. Denies chest congestion, productive cough or wheezing. Denies chest pains, palpitations and leg swelling Denies abdominal pain, nausea, vomiting,diarrhea or constipation.   Denies dysuria, frequency, hesitancy or incontinence. Chronic  joint pain, and limitation in mobility. Chronic , numbness, or tingling. Denies depression, anxiety or insomnia. Denies skin break down or rash.   PE  BP 119/62 (BP Location: Right Arm, Patient Position: Sitting, Cuff Size: Normal)   Pulse (!) 48   Ht 5\' 5"  (1.651 m)   Wt 118 lb (53.5 kg)   SpO2 98%   BMI 19.64 kg/m   Patient alert and oriented and in no cardiopulmonary distress.  HEENT: No facial asymmetry, EOMI,     Neck supple .  Chest: Clear to auscultation bilaterally.  CVS: S1, S2 no murmurs, no S3.Regular rate.  ABD: Soft non tender.   Ext: No edema  MS: decreased  ROM spine,  hips and knees.  Skin: Intact, no ulcerations or rash noted.  Psych: Good eye contact, normal affect. not anxious or depressed appearing.  CNS: CN 2-12 intact, power,  normal throughout.no focal deficits noted.   Assessment & Plan  Labile hypertension Currently controlled nd stable continue current med   Cystitis Managed by Urology  Insomnia secondary to depression with anxiety Controlled, no change in medication   MPN (myeloproliferative neoplasm) ([place]) Managed by Oncology,  n hospice care but receiving active treatment, hospice support is adding quality to her life as well as the family's   Chronic midline low back pain with sciatica Pain management through hospice  MDD (major depressive disorder), recurrent episode, moderate (HCC) Stable and controlled on current regime  Anxiety Stable and controlled, continue current meds

## 2022-04-24 NOTE — Assessment & Plan Note (Signed)
Managed by Oncology, n hospice care but receiving active treatment, hospice support is adding quality to her life as well as the family's

## 2022-04-24 NOTE — Assessment & Plan Note (Signed)
Currently controlled nd stable continue current med

## 2022-04-25 ENCOUNTER — Other Ambulatory Visit: Payer: Self-pay | Admitting: Family Medicine

## 2022-04-26 DIAGNOSIS — Z0279 Encounter for issue of other medical certificate: Secondary | ICD-10-CM | POA: Diagnosis not present

## 2022-04-27 ENCOUNTER — Other Ambulatory Visit: Payer: Self-pay | Admitting: Family Medicine

## 2022-04-27 DIAGNOSIS — I129 Hypertensive chronic kidney disease with stage 1 through stage 4 chronic kidney disease, or unspecified chronic kidney disease: Secondary | ICD-10-CM | POA: Diagnosis not present

## 2022-04-27 DIAGNOSIS — M329 Systemic lupus erythematosus, unspecified: Secondary | ICD-10-CM | POA: Diagnosis not present

## 2022-04-27 DIAGNOSIS — G709 Myoneural disorder, unspecified: Secondary | ICD-10-CM | POA: Diagnosis not present

## 2022-04-27 DIAGNOSIS — N183 Chronic kidney disease, stage 3 unspecified: Secondary | ICD-10-CM | POA: Diagnosis not present

## 2022-04-27 DIAGNOSIS — C946 Myelodysplastic disease, not classified: Secondary | ICD-10-CM | POA: Diagnosis not present

## 2022-04-27 DIAGNOSIS — D631 Anemia in chronic kidney disease: Secondary | ICD-10-CM | POA: Diagnosis not present

## 2022-05-03 DIAGNOSIS — D631 Anemia in chronic kidney disease: Secondary | ICD-10-CM | POA: Diagnosis not present

## 2022-05-03 DIAGNOSIS — I129 Hypertensive chronic kidney disease with stage 1 through stage 4 chronic kidney disease, or unspecified chronic kidney disease: Secondary | ICD-10-CM | POA: Diagnosis not present

## 2022-05-03 DIAGNOSIS — M329 Systemic lupus erythematosus, unspecified: Secondary | ICD-10-CM | POA: Diagnosis not present

## 2022-05-03 DIAGNOSIS — N183 Chronic kidney disease, stage 3 unspecified: Secondary | ICD-10-CM | POA: Diagnosis not present

## 2022-05-03 DIAGNOSIS — G709 Myoneural disorder, unspecified: Secondary | ICD-10-CM | POA: Diagnosis not present

## 2022-05-03 DIAGNOSIS — C946 Myelodysplastic disease, not classified: Secondary | ICD-10-CM | POA: Diagnosis not present

## 2022-05-05 ENCOUNTER — Ambulatory Visit (HOSPITAL_COMMUNITY)
Admission: RE | Admit: 2022-05-05 | Discharge: 2022-05-05 | Disposition: A | Discharge status: Home / Self Care | Payer: Medicare Other | Source: Ambulatory Visit | Attending: Hematology | Admitting: Hematology

## 2022-05-05 ENCOUNTER — Inpatient Hospital Stay: Payer: Medicare Other

## 2022-05-05 VITALS — BP 147/42 | HR 77 | Temp 98.1°F | Resp 20

## 2022-05-05 DIAGNOSIS — R161 Splenomegaly, not elsewhere classified: Secondary | ICD-10-CM | POA: Diagnosis not present

## 2022-05-05 DIAGNOSIS — Z79899 Other long term (current) drug therapy: Secondary | ICD-10-CM | POA: Diagnosis not present

## 2022-05-05 DIAGNOSIS — N189 Chronic kidney disease, unspecified: Secondary | ICD-10-CM | POA: Diagnosis not present

## 2022-05-05 DIAGNOSIS — R634 Abnormal weight loss: Secondary | ICD-10-CM | POA: Diagnosis not present

## 2022-05-05 DIAGNOSIS — R748 Abnormal levels of other serum enzymes: Secondary | ICD-10-CM | POA: Diagnosis not present

## 2022-05-05 DIAGNOSIS — R162 Hepatomegaly with splenomegaly, not elsewhere classified: Secondary | ICD-10-CM | POA: Diagnosis not present

## 2022-05-05 DIAGNOSIS — Z95828 Presence of other vascular implants and grafts: Secondary | ICD-10-CM

## 2022-05-05 DIAGNOSIS — D471 Chronic myeloproliferative disease: Secondary | ICD-10-CM

## 2022-05-05 LAB — CBC WITH DIFFERENTIAL/PLATELET
Abs Immature Granulocytes: 11.3 10*3/uL — ABNORMAL HIGH (ref 0.00–0.07)
Band Neutrophils: 15 %
Basophils Absolute: 0 10*3/uL (ref 0.0–0.1)
Basophils Relative: 0 %
Eosinophils Absolute: 1.3 10*3/uL — ABNORMAL HIGH (ref 0.0–0.5)
Eosinophils Relative: 2 %
HCT: 25.4 % — ABNORMAL LOW (ref 36.0–46.0)
Hemoglobin: 8.1 g/dL — ABNORMAL LOW (ref 12.0–15.0)
Lymphocytes Relative: 6 %
Lymphs Abs: 3.8 10*3/uL (ref 0.7–4.0)
MCH: 30.7 pg (ref 26.0–34.0)
MCHC: 31.9 g/dL (ref 30.0–36.0)
MCV: 96.2 fL (ref 80.0–100.0)
Metamyelocytes Relative: 13 %
Monocytes Absolute: 5.6 10*3/uL — ABNORMAL HIGH (ref 0.1–1.0)
Monocytes Relative: 9 %
Myelocytes: 5 %
Neutro Abs: 40.8 10*3/uL — ABNORMAL HIGH (ref 1.7–7.7)
Neutrophils Relative %: 50 %
Platelets: 137 10*3/uL — ABNORMAL LOW (ref 150–400)
RBC: 2.64 MIL/uL — ABNORMAL LOW (ref 3.87–5.11)
RDW: 22.6 % — ABNORMAL HIGH (ref 11.5–15.5)
WBC: 62.7 10*3/uL (ref 4.0–10.5)
nRBC: 11 /100 WBC — ABNORMAL HIGH
nRBC: 7.2 % — ABNORMAL HIGH (ref 0.0–0.2)

## 2022-05-05 LAB — SAMPLE TO BLOOD BANK

## 2022-05-05 MED ORDER — SODIUM CHLORIDE 0.9% FLUSH
10.0000 mL | Freq: Once | INTRAVENOUS | Status: AC
  Administered 2022-05-05: 10 mL via INTRAVENOUS

## 2022-05-05 MED ORDER — HEPARIN SOD (PORK) LOCK FLUSH 100 UNIT/ML IV SOLN
500.0000 [IU] | Freq: Once | INTRAVENOUS | Status: AC
  Administered 2022-05-05: 500 [IU] via INTRAVENOUS

## 2022-05-05 NOTE — Progress Notes (Signed)
CRITICAL VALUE ALERT Critical value received:  WBC 62.7 Date of notification:  05-05-22 Time of notification: [id number] Critical value read back:  Yes.   Nurse who received alert:  C.Linn Clavin RN MD notified time and response:  [name], no new orders.

## 2022-05-05 NOTE — Patient Instructions (Signed)
[facility]  Discharge Instructions: Thank you for choosing [facility] to provide your oncology and hematology care.  If you have a lab appointment with the [facility], please come in thru the Main Entrance and check in at the main information desk.  Wear comfortable clothing and clothing appropriate for easy access to any Portacath or PICC line.   We strive to give you quality time with your provider. You may need to reschedule your appointment if you arrive late (15 or more minutes).  Arriving late affects you and other patients whose appointments are after yours.  Also, if you miss three or more appointments without notifying the office, you may be dismissed from the clinic at the provider's discretion.      For prescription refill requests, have your pharmacy contact our office and allow 72 hours for refills to be completed.    Today you received the following Port flush with lab, return as scheduled.  To help prevent nausea and vomiting after your treatment, we encourage you to take your nausea medication as directed.  BELOW ARE SYMPTOMS THAT SHOULD BE REPORTED IMMEDIATELY: *FEVER GREATER THAN 100.4 F (38 C) OR HIGHER *CHILLS OR SWEATING *NAUSEA AND VOMITING THAT IS NOT CONTROLLED WITH YOUR NAUSEA MEDICATION *UNUSUAL SHORTNESS OF BREATH *UNUSUAL BRUISING OR BLEEDING *URINARY PROBLEMS (pain or burning when urinating, or frequent urination) *BOWEL PROBLEMS (unusual diarrhea, constipation, pain near the anus) TENDERNESS IN MOUTH AND THROAT WITH OR WITHOUT PRESENCE OF ULCERS (sore throat, sores in mouth, or a toothache) UNUSUAL RASH, SWELLING OR PAIN  UNUSUAL VAGINAL DISCHARGE OR ITCHING   Items with * indicate a potential emergency and should be followed up as soon as possible or go to the Emergency Department if any problems should occur.  Please show the CHEMOTHERAPY ALERT CARD or IMMUNOTHERAPY ALERT CARD at check-in to the Emergency Department  and triage nurse.  Should you have questions after your visit or need to cancel or reschedule your appointment, please contact [facility] [phone]  and follow the prompts.  Office hours are 8:00 a.m. to 4:30 p.m. Monday - Friday. Please note that voicemails left after 4:00 p.m. may not be returned until the following business day.  We are closed weekends and major holidays. You have access to a nurse at all times for urgent questions. Please call the main number to the clinic [phone] and follow the prompts.  For any non-urgent questions, you may also contact your provider using MyChart. We now offer e-Visits for anyone 38 and older to request care online for non-urgent symptoms. For details visit [email or web address].   Also download the MyChart app! Go to the app store, search "MyChart", open the app, select [facility], and log in with your MyChart username and password.

## 2022-05-05 NOTE — Progress Notes (Signed)
Port flushed with good blood return noted. No bruising or swelling at site. Bandaid applied and patient discharged in satisfactory condition. VVS stable with no signs or symptoms of distressed noted. 

## 2022-05-10 ENCOUNTER — Other Ambulatory Visit: Payer: Self-pay | Admitting: Family Medicine

## 2022-05-11 ENCOUNTER — Other Ambulatory Visit (HOSPITAL_COMMUNITY): Payer: Self-pay

## 2022-05-11 ENCOUNTER — Other Ambulatory Visit: Payer: Self-pay | Admitting: Family Medicine

## 2022-05-11 DIAGNOSIS — G709 Myoneural disorder, unspecified: Secondary | ICD-10-CM | POA: Diagnosis not present

## 2022-05-11 DIAGNOSIS — I129 Hypertensive chronic kidney disease with stage 1 through stage 4 chronic kidney disease, or unspecified chronic kidney disease: Secondary | ICD-10-CM | POA: Diagnosis not present

## 2022-05-11 DIAGNOSIS — N183 Chronic kidney disease, stage 3 unspecified: Secondary | ICD-10-CM | POA: Diagnosis not present

## 2022-05-11 DIAGNOSIS — M329 Systemic lupus erythematosus, unspecified: Secondary | ICD-10-CM | POA: Diagnosis not present

## 2022-05-11 DIAGNOSIS — C946 Myelodysplastic disease, not classified: Secondary | ICD-10-CM | POA: Diagnosis not present

## 2022-05-11 DIAGNOSIS — D631 Anemia in chronic kidney disease: Secondary | ICD-10-CM | POA: Diagnosis not present

## 2022-05-12 ENCOUNTER — Other Ambulatory Visit: Payer: Self-pay

## 2022-05-13 ENCOUNTER — Other Ambulatory Visit: Payer: Self-pay

## 2022-05-13 MED ORDER — ALLOPURINOL 200 MG PO TABS: 200.0000 mg | ORAL_TABLET | Freq: Every day | ORAL | 3 refills | Status: DC

## 2022-05-15 DIAGNOSIS — N183 Chronic kidney disease, stage 3 unspecified: Secondary | ICD-10-CM | POA: Diagnosis not present

## 2022-05-15 DIAGNOSIS — M329 Systemic lupus erythematosus, unspecified: Secondary | ICD-10-CM | POA: Diagnosis not present

## 2022-05-15 DIAGNOSIS — M519 Unspecified thoracic, thoracolumbar and lumbosacral intervertebral disc disorder: Secondary | ICD-10-CM | POA: Diagnosis not present

## 2022-05-15 DIAGNOSIS — Z87891 Personal history of nicotine dependence: Secondary | ICD-10-CM | POA: Diagnosis not present

## 2022-05-15 DIAGNOSIS — M542 Cervicalgia: Secondary | ICD-10-CM | POA: Diagnosis not present

## 2022-05-15 DIAGNOSIS — I129 Hypertensive chronic kidney disease with stage 1 through stage 4 chronic kidney disease, or unspecified chronic kidney disease: Secondary | ICD-10-CM | POA: Diagnosis not present

## 2022-05-15 DIAGNOSIS — F32A Depression, unspecified: Secondary | ICD-10-CM | POA: Diagnosis not present

## 2022-05-15 DIAGNOSIS — M549 Dorsalgia, unspecified: Secondary | ICD-10-CM | POA: Diagnosis not present

## 2022-05-15 DIAGNOSIS — C946 Myelodysplastic disease, not classified: Secondary | ICD-10-CM | POA: Diagnosis not present

## 2022-05-15 DIAGNOSIS — M1990 Unspecified osteoarthritis, unspecified site: Secondary | ICD-10-CM | POA: Diagnosis not present

## 2022-05-15 DIAGNOSIS — G709 Myoneural disorder, unspecified: Secondary | ICD-10-CM | POA: Diagnosis not present

## 2022-05-15 DIAGNOSIS — G8929 Other chronic pain: Secondary | ICD-10-CM | POA: Diagnosis not present

## 2022-05-15 DIAGNOSIS — F419 Anxiety disorder, unspecified: Secondary | ICD-10-CM | POA: Diagnosis not present

## 2022-05-15 DIAGNOSIS — D696 Thrombocytopenia, unspecified: Secondary | ICD-10-CM | POA: Diagnosis not present

## 2022-05-15 DIAGNOSIS — D631 Anemia in chronic kidney disease: Secondary | ICD-10-CM | POA: Diagnosis not present

## 2022-05-15 DIAGNOSIS — G4700 Insomnia, unspecified: Secondary | ICD-10-CM | POA: Diagnosis not present

## 2022-05-15 DIAGNOSIS — Z87440 Personal history of urinary (tract) infections: Secondary | ICD-10-CM | POA: Diagnosis not present

## 2022-05-15 DIAGNOSIS — K2270 Barrett's esophagus without dysplasia: Secondary | ICD-10-CM | POA: Diagnosis not present

## 2022-05-15 DIAGNOSIS — K219 Gastro-esophageal reflux disease without esophagitis: Secondary | ICD-10-CM | POA: Diagnosis not present

## 2022-05-16 ENCOUNTER — Telehealth: Payer: Self-pay | Admitting: Family Medicine

## 2022-05-16 ENCOUNTER — Other Ambulatory Visit: Payer: Self-pay

## 2022-05-16 NOTE — Telephone Encounter (Signed)
Pt called in for refill on tiZANidine (ZANAFLEX) 4 MG tablet  Needs enough to last until 4/20 until can get in with the office that prescribes med for her

## 2022-05-17 ENCOUNTER — Encounter: Payer: Self-pay | Admitting: Urology

## 2022-05-17 ENCOUNTER — Ambulatory Visit (INDEPENDENT_AMBULATORY_CARE_PROVIDER_SITE_OTHER): Payer: Medicare Other | Admitting: Urology

## 2022-05-17 ENCOUNTER — Other Ambulatory Visit: Payer: Self-pay

## 2022-05-17 VITALS — BP 111/74 | HR 72

## 2022-05-17 DIAGNOSIS — Z87440 Personal history of urinary (tract) infections: Secondary | ICD-10-CM | POA: Diagnosis not present

## 2022-05-17 DIAGNOSIS — N390 Urinary tract infection, site not specified: Secondary | ICD-10-CM | POA: Diagnosis not present

## 2022-05-17 DIAGNOSIS — N952 Postmenopausal atrophic vaginitis: Secondary | ICD-10-CM | POA: Diagnosis not present

## 2022-05-17 DIAGNOSIS — R339 Retention of urine, unspecified: Secondary | ICD-10-CM | POA: Diagnosis not present

## 2022-05-17 LAB — MICROSCOPIC EXAMINATION: WBC, UA: >30 /hpf — AB (ref 0–5)

## 2022-05-17 LAB — URINALYSIS, ROUTINE W REFLEX MICROSCOPIC
Bilirubin, UA: NEGATIVE
Glucose, UA: NEGATIVE
Ketones, UA: NEGATIVE
Nitrite, UA: NEGATIVE
Specific Gravity, UA: 1.015 (ref 1.005–1.030)
Urobilinogen, Ur: 1 mg/dL (ref 0.2–1.0)
pH, UA: 7 (ref 5.0–7.5)

## 2022-05-17 MED ORDER — TIZANIDINE HCL 4 MG PO TABS: 4.0000 mg | ORAL_TABLET | Freq: Three times a day (TID) | ORAL | 0 refills | Status: DC | PRN

## 2022-05-17 NOTE — Telephone Encounter (Signed)
Refill sent.

## 2022-05-17 NOTE — Progress Notes (Signed)
History of Present Illness: Renee Waters is a 77 y.o. year old female here for follow-up of recurrent urinary tract infections as well as incomplete bladder emptying.  First visit was February 20.  It was recommended that she double void, be started on perivaginal estrogen cream.  4.9.2024: Here for routine check.  She was not able to use estrogen cream as it broke her out.  She is still on d-mannose, probiotics, drinks a fair amount of fluid.  Does not double void.  Her urine is a bit darker, smell strong but does not cause dysuria or any cystic change in lower urinary tract symptoms.    Past Medical History:  Diagnosis Date   Acute cholangitis    Acute respiratory failure with hypoxia (HCC) 09/01/2021   Allergy    Anemia    Anxiety    Arthritis    Phreesia 03/18/2020   Barrett's esophagus    Cataract    Chronic back pain    Chronic neck pain    CKD (chronic kidney disease) stage 3, GFR 30-59 ml/min (HCC) 10/29/2020   Depression    Genital herpes    GERD (gastroesophageal reflux disease)    H/O degenerative disc disease    History of blood transfusion    Hypertension    Hypoxia 12/01/2021   Insomnia    Lupus (systemic lupus erythematosus) (HCC)    Neuromuscular disorder (HCC)    Osteoarthritis    S/P colonoscopy 07/2003   normal, no polyps   S/P endoscopy June 2005, Oct 2009   2005: short-segment Barrett's, 2009: short-segment Barrett's   Upper respiratory tract infection 07/09/2020   UTI (lower urinary tract infection) 11/2012    Past Surgical History:  Procedure Laterality Date   ABDOMINAL HYSTERECTOMY     BACK SURGERY     BIOPSY N/A 03/20/2014   Procedure: BIOPSY;  Surgeon: Corbin Ade, MD;  Location: [facility];  Service: Endoscopy;  Laterality: N/A;   BIOPSY  09/14/2015   Procedure: BIOPSY;  Surgeon: Corbin Ade, MD;  Location: [facility];  Service: Endoscopy;;  esophageal and gastric   BIOPSY  07/23/2019   Procedure: BIOPSY;  Surgeon: Corbin Ade, MD;   Location: [facility];  Service: Endoscopy;;  esophageal    CARPAL TUNNEL RELEASE Left 2013   cervical disectomy  2002   CESAREAN SECTION N/A    Phreesia 03/18/2020   CHOLECYSTECTOMY     with lysis of adhesions for sbo; "ruptured gallbladder".   COLONOSCOPY  11/09/2011   RMR: Melanosis coli   COLONOSCOPY WITH PROPOFOL N/A 09/14/2015   Dr. Jena Gauss: diverticulosis, 34mm TA removed. next TCS 09/2020.    COLONOSCOPY WITH PROPOFOL N/A 04/30/2020   Procedure: COLONOSCOPY WITH PROPOFOL;  Surgeon: Corbin Ade, MD;  Location: [facility];  Service: Endoscopy;  Laterality: N/A;  PM (ASA 3)   DENTAL SURGERY  11/2015   multiple tooth extraction   ESOPHAGOGASTRODUODENOSCOPY  11/29/2007   salmon-colored  tongue   longest stable at  3 cm, distal esophagus as described previously status post biopsy/ Hiatal hernia, otherwise normal stomach D1 and D2   ESOPHAGOGASTRODUODENOSCOPY  01/06/11   short segment Barrett's esophagus s/p bx/Hiatal hernia   ESOPHAGOGASTRODUODENOSCOPY (EGD) WITH PROPOFOL N/A 03/20/2014   RKV:[id number] distal esophagus short segment barrett's, bx with no dysplasia. next egd in 03/2017   ESOPHAGOGASTRODUODENOSCOPY (EGD) WITH PROPOFOL N/A 09/14/2015   Dr. Jena Gauss: Barrett's without dysplasia, gastritis benign bx, hiatal hernia. next EGD 09/2018.   ESOPHAGOGASTRODUODENOSCOPY (EGD) WITH PROPOFOL  N/A 07/23/2019   Procedure: ESOPHAGOGASTRODUODENOSCOPY (EGD) WITH PROPOFOL;  Surgeon: Corbin Ade, MD;  [facility];  Service: Endoscopy;  Laterality: N/A;  3:00pm   EYE SURGERY N/A    Phreesia 03/18/2020   HERNIA REPAIR Right 07/2010   Dr. Arlean Hopping   IR IMAGING GUIDED PORT INSERTION  02/09/2021   JOINT REPLACEMENT     LAPAROSCOPIC CHOLECYSTECTOMY  2017   at [facility]   POLYPECTOMY  09/14/2015   Procedure: POLYPECTOMY;  Surgeon: Corbin Ade, MD;  Location: [facility];  Service: Endoscopy;;  ascending colon   right hip replacement  07/2010   went back in sept 2012 to fix   SHOULDER  ARTHROSCOPY  2008   left   SPINE SURGERY N/A    Phreesia 03/18/2020   TOTAL HIP REVISION Right 12/17/2012   Procedure: RIGHT TOTAL HIP REVISION;  Surgeon: [name], MD;  Location: [facility];  Service: Orthopedics;  Laterality: Right;   WRIST SURGERY Right 2011   open reduction right wrist.    Home Medications:  (Not in a hospital admission)   Allergies:  Allergies  Allergen Reactions   Bee Venom Swelling and Hives   Norvasc [Amlodipine] Other (See Comments)    Hair loss   Tylenol [Acetaminophen] Itching   Hydralazine Other (See Comments)    Hair loss   Red Dye Itching   Percocet [Oxycodone-Acetaminophen] Rash and Other (See Comments)    Pt states, "this gives her a rash, but at home she takes oxycodone for pain relief"    Tyloxapol Itching and Rash    Family History  Problem Relation Age of Onset   Hypertension Mother    Stroke Mother    Colon cancer Neg Hx    Anesthesia problems Neg Hx    Hypotension Neg Hx    Malignant hyperthermia Neg Hx    Pseudochol deficiency Neg Hx    Gastric cancer Neg Hx    Esophageal cancer Neg Hx     Social History:  reports that she quit smoking about 19 years ago. Her smoking use included cigarettes. She has a 6.25 pack-year smoking history. She has never used smokeless tobacco. She reports that she does not drink alcohol and does not use drugs.  ROS: A complete review of systems was performed.  All systems are negative except for pertinent findings as noted.  Physical Exam:  Vital signs in last 24 hours: @VSRANGES @ General:  Alert and oriented, No acute distress HEENT: Normocephalic, atraumatic Neck: No JVD or lymphadenopathy Cardiovascular: Regular rate  Extremities: No edema Neurologic: Grossly intact  I have reviewed prior pt notes  I have reviewed urinalysis results  I have independently reviewed bladder scan results-250 mL  I have reviewed most recent urine culture  Laboratories from last month  reviewed  Impression/Assessment:  Recurrent urinary tract infections-currently it looks like she is colonized although she is not terribly symptomatic  Postmenopausal atrophic vaginal changes, not able to tolerate paravaginal estrogen  Plan:  I strongly recommended that she double void at least 2-3 times a day  Continue mannose, drinking lots of fluids  I will see her back in about 3 months for recheck  Her urine was cultured today but no antibiotics administered  Bertram Millard Duvid Smalls 05/17/2022, 8:01 AM  Bertram Millard. Latorya Bautch MD

## 2022-05-18 ENCOUNTER — Other Ambulatory Visit: Payer: Self-pay

## 2022-05-18 ENCOUNTER — Other Ambulatory Visit (HOSPITAL_COMMUNITY): Payer: Self-pay

## 2022-05-19 ENCOUNTER — Other Ambulatory Visit (HOSPITAL_COMMUNITY): Payer: Self-pay

## 2022-05-19 ENCOUNTER — Inpatient Hospital Stay: Payer: Medicare Other | Attending: Hematology

## 2022-05-19 VITALS — BP 118/86 | HR 70 | Temp 98.3°F | Resp 16

## 2022-05-19 DIAGNOSIS — Z87891 Personal history of nicotine dependence: Secondary | ICD-10-CM | POA: Diagnosis not present

## 2022-05-19 DIAGNOSIS — D649 Anemia, unspecified: Secondary | ICD-10-CM

## 2022-05-19 DIAGNOSIS — E876 Hypokalemia: Secondary | ICD-10-CM | POA: Diagnosis not present

## 2022-05-19 DIAGNOSIS — Z95828 Presence of other vascular implants and grafts: Secondary | ICD-10-CM

## 2022-05-19 DIAGNOSIS — N183 Chronic kidney disease, stage 3 unspecified: Secondary | ICD-10-CM | POA: Diagnosis not present

## 2022-05-19 DIAGNOSIS — Z79899 Other long term (current) drug therapy: Secondary | ICD-10-CM | POA: Insufficient documentation

## 2022-05-19 DIAGNOSIS — D471 Chronic myeloproliferative disease: Secondary | ICD-10-CM

## 2022-05-19 LAB — CBC WITH DIFFERENTIAL/PLATELET
Abs Immature Granulocytes: 9.5 10*3/uL — ABNORMAL HIGH (ref 0.00–0.07)
Band Neutrophils: 7 %
Basophils Absolute: 0.5 10*3/uL — ABNORMAL HIGH (ref 0.0–0.1)
Basophils Relative: 1 %
Blasts: 1 %
Eosinophils Absolute: 0.5 10*3/uL (ref 0.0–0.5)
Eosinophils Relative: 1 %
HCT: 23.1 % — ABNORMAL LOW (ref 36.0–46.0)
Hemoglobin: 7.4 g/dL — ABNORMAL LOW (ref 12.0–15.0)
Lymphocytes Relative: 1 %
Lymphs Abs: 0.5 10*3/uL — ABNORMAL LOW (ref 0.7–4.0)
MCH: 31.5 pg (ref 26.0–34.0)
MCHC: 32 g/dL (ref 30.0–36.0)
MCV: 98.3 fL (ref 80.0–100.0)
Metamyelocytes Relative: 11 %
Monocytes Absolute: 4.2 10*3/uL — ABNORMAL HIGH (ref 0.1–1.0)
Monocytes Relative: 8 %
Myelocytes: 7 %
Neutro Abs: 36.8 10*3/uL — ABNORMAL HIGH (ref 1.7–7.7)
Neutrophils Relative %: 63 %
Platelets: 147 10*3/uL — ABNORMAL LOW (ref 150–400)
RBC: 2.35 MIL/uL — ABNORMAL LOW (ref 3.87–5.11)
RDW: 22.5 % — ABNORMAL HIGH (ref 11.5–15.5)
WBC: 52.6 10*3/uL (ref 4.0–10.5)
nRBC: 10 /100 WBC — ABNORMAL HIGH
nRBC: 8.2 % — ABNORMAL HIGH (ref 0.0–0.2)

## 2022-05-19 LAB — BPAM RBC: Blood Product Expiration Date: 202405132359

## 2022-05-19 LAB — TYPE AND SCREEN
DAT, IgG: NEGATIVE
Donor AG Type: NEGATIVE

## 2022-05-19 LAB — PREPARE RBC (CROSSMATCH)

## 2022-05-19 MED ORDER — SODIUM CHLORIDE 0.9% FLUSH
10.0000 mL | INTRAVENOUS | Status: DC | PRN
  Administered 2022-05-19: 10 mL via INTRAVENOUS

## 2022-05-19 MED ORDER — HEPARIN SOD (PORK) LOCK FLUSH 100 UNIT/ML IV SOLN
500.0000 [IU] | Freq: Once | INTRAVENOUS | Status: AC
  Administered 2022-05-19: 500 [IU] via INTRAVENOUS

## 2022-05-19 NOTE — Progress Notes (Signed)
Hemoglobin is 7.4 today. Will order one unit of blood for transfusion tomorrow per MD.

## 2022-05-19 NOTE — Addendum Note (Signed)
Addended by: Harrel Lemon on: 05/19/2022 01:59 PM   Modules accepted: Orders

## 2022-05-19 NOTE — Progress Notes (Signed)
Patients port flushed without difficulty.  Good blood return noted with no bruising or swelling noted at site.  Band aid applied.  VSS with discharge and left in satisfactory condition with no s/s of distress noted.   

## 2022-05-19 NOTE — Patient Instructions (Signed)
[facility]  Discharge Instructions: Thank you for choosing [facility] to provide your oncology and hematology care.  If you have a lab appointment with the Cancer Center - please note that after April 8th, 2024, all labs will be drawn in the cancer center.  You do not have to check in or register with the main entrance as you have in the past but will complete your check-in in the cancer center.  Wear comfortable clothing and clothing appropriate for easy access to any Portacath or PICC line.   We strive to give you quality time with your provider. You may need to reschedule your appointment if you arrive late (15 or more minutes).  Arriving late affects you and other patients whose appointments are after yours.  Also, if you miss three or more appointments without notifying the office, you may be dismissed from the clinic at the provider's discretion.      For prescription refill requests, have your pharmacy contact our office and allow 72 hours for refills to be completed.    Today you received the following portflush and labs.  To help prevent nausea and vomiting after your treatment, we encourage you to take your nausea medication as directed.  BELOW ARE SYMPTOMS THAT SHOULD BE REPORTED IMMEDIATELY: *FEVER GREATER THAN 100.4 F (38 C) OR HIGHER *CHILLS OR SWEATING *NAUSEA AND VOMITING THAT IS NOT CONTROLLED WITH YOUR NAUSEA MEDICATION *UNUSUAL SHORTNESS OF BREATH *UNUSUAL BRUISING OR BLEEDING *URINARY PROBLEMS (pain or burning when urinating, or frequent urination) *BOWEL PROBLEMS (unusual diarrhea, constipation, pain near the anus) TENDERNESS IN MOUTH AND THROAT WITH OR WITHOUT PRESENCE OF ULCERS (sore throat, sores in mouth, or a toothache) UNUSUAL RASH, SWELLING OR PAIN  UNUSUAL VAGINAL DISCHARGE OR ITCHING   Items with * indicate a potential emergency and should be followed up as soon as possible or go to the Emergency Department if any problems  should occur.  Please show the CHEMOTHERAPY ALERT CARD or IMMUNOTHERAPY ALERT CARD at check-in to the Emergency Department and triage nurse.  Should you have questions after your visit or need to cancel or reschedule your appointment, please contact [facility] [phone]  and follow the prompts.  Office hours are 8:00 a.m. to 4:30 p.m. Monday - Friday. Please note that voicemails left after 4:00 p.m. may not be returned until the following business day.  We are closed weekends and major holidays. You have access to a nurse at all times for urgent questions. Please call the main number to the clinic [phone] and follow the prompts.  For any non-urgent questions, you may also contact your provider using MyChart. We now offer e-Visits for anyone 18 and older to request care online for non-urgent symptoms. For details visit [email or web address].   Also download the MyChart app! Go to the app store, search "MyChart", open the app, select [facility], and log in with your MyChart username and password.   

## 2022-05-19 NOTE — Progress Notes (Signed)
CRITICAL VALUE ALERT Critical value received:  WBC 52.6 Date of notification:  05-19-22 Time of notification: 1424 Critical value read back:  Yes.   Nurse who received alert:  C. Arria Naim RN MD notified time and response:  1420 Dr. Ellin Saba, no new orders at this time

## 2022-05-20 ENCOUNTER — Inpatient Hospital Stay: Payer: Medicare Other

## 2022-05-20 DIAGNOSIS — D471 Chronic myeloproliferative disease: Secondary | ICD-10-CM | POA: Diagnosis not present

## 2022-05-20 DIAGNOSIS — D649 Anemia, unspecified: Secondary | ICD-10-CM

## 2022-05-20 DIAGNOSIS — N183 Chronic kidney disease, stage 3 unspecified: Secondary | ICD-10-CM | POA: Diagnosis not present

## 2022-05-20 DIAGNOSIS — E876 Hypokalemia: Secondary | ICD-10-CM | POA: Diagnosis not present

## 2022-05-20 DIAGNOSIS — Z79899 Other long term (current) drug therapy: Secondary | ICD-10-CM | POA: Diagnosis not present

## 2022-05-20 DIAGNOSIS — Z87891 Personal history of nicotine dependence: Secondary | ICD-10-CM | POA: Diagnosis not present

## 2022-05-20 LAB — BPAM RBC: Unit Type and Rh: 5100

## 2022-05-20 LAB — URINE CULTURE

## 2022-05-20 MED ORDER — SODIUM CHLORIDE 0.9% IV SOLUTION
250.0000 mL | Freq: Once | INTRAVENOUS | Status: AC
  Administered 2022-05-20: 250 mL via INTRAVENOUS

## 2022-05-20 MED ORDER — DIPHENHYDRAMINE HCL 25 MG PO CAPS
25.0000 mg | ORAL_CAPSULE | Freq: Once | ORAL | Status: AC
  Administered 2022-05-20: 25 mg via ORAL
  Filled 2022-05-20: qty 1

## 2022-05-20 MED ORDER — ACETAMINOPHEN 325 MG PO TABS
650.0000 mg | ORAL_TABLET | Freq: Once | ORAL | Status: AC
  Administered 2022-05-20: 650 mg via ORAL
  Filled 2022-05-20: qty 2

## 2022-05-20 MED ORDER — SODIUM CHLORIDE 0.9% FLUSH
10.0000 mL | INTRAVENOUS | Status: AC | PRN
  Administered 2022-05-20: 10 mL

## 2022-05-20 MED ORDER — HEPARIN SOD (PORK) LOCK FLUSH 100 UNIT/ML IV SOLN
500.0000 [IU] | Freq: Every day | INTRAVENOUS | Status: AC | PRN
  Administered 2022-05-20: 500 [IU]

## 2022-05-20 NOTE — Patient Instructions (Signed)
[facility]  Discharge Instructions: Thank you for choosing [facility] to provide your oncology and hematology care.  If you have a lab appointment with the Cancer Center - please note that after April 8th, 2024, all labs will be drawn in the cancer center.  You do not have to check in or register with the main entrance as you have in the past but will complete your check-in in the cancer center.  Wear comfortable clothing and clothing appropriate for easy access to any Portacath or PICC line.   We strive to give you quality time with your provider. You may need to reschedule your appointment if you arrive late (15 or more minutes).  Arriving late affects you and other patients whose appointments are after yours.  Also, if you miss three or more appointments without notifying the office, you may be dismissed from the clinic at the provider's discretion.      For prescription refill requests, have your pharmacy contact our office and allow 72 hours for refills to be completed.    Today you received the following chemotherapy and/or immunotherapy agents 1 unit of blood.  Blood Transfusion, Adult, Care After The following information offers guidance on how to care for yourself after your procedure. Your health care provider may also give you more specific instructions. If you have problems or questions, contact your health care provider. What can I expect after the procedure? After the procedure, it is common to have: Bruising and soreness where the IV was inserted. A headache. Follow these instructions at home: IV insertion site care     Follow instructions from your health care provider about how to take care of your IV insertion site. Make sure you: Wash your hands with soap and water for at least 20 seconds before and after you change your bandage (dressing). If soap and water are not available, use hand sanitizer. Change your dressing as told by your  health care provider. Check your IV insertion site every day for signs of infection. Check for: Redness, swelling, or pain. Bleeding from the site. Warmth. Pus or a bad smell. General instructions Take over-the-counter and prescription medicines only as told by your health care provider. Rest as told by your health care provider. Return to your normal activities as told by your health care provider. Keep all follow-up visits. Lab tests may need to be done at certain periods to recheck your blood counts. Contact a health care provider if: You have itching or red, swollen areas of skin (hives). You have a fever or chills. You have pain in the head, back, or chest. You feel anxious or you feel weak after doing your normal activities. You have redness, swelling, warmth, or pain around the IV insertion site. You have blood coming from the IV insertion site that does not stop with pressure. You have pus or a bad smell coming from your IV insertion site. If you received your blood transfusion in an outpatient setting, you will be told whom to contact to report any reactions. Get help right away if: You have symptoms of a serious allergic or immune system reaction, including: Trouble breathing or shortness of breath. Swelling of the face, feeling flushed, or widespread rash. Dark urine or blood in the urine. Fast heartbeat. These symptoms may be an emergency. Get help right away. Call 911. Do not wait to see if the symptoms will go away. Do not drive yourself to the hospital. Summary Bruising and  soreness around the IV insertion site are common. Check your IV insertion site every day for signs of infection. Rest as told by your health care provider. Return to your normal activities as told by your health care provider. Get help right away for symptoms of a serious allergic or immune system reaction to the blood transfusion. This information is not intended to replace advice given to you by  your health care provider. Make sure you discuss any questions you have with your health care provider. Document Revised: 04/23/2021 Document Reviewed: 04/23/2021 Elsevier Patient Education  2023 Elsevier Inc.       To help prevent nausea and vomiting after your treatment, we encourage you to take your nausea medication as directed.  BELOW ARE SYMPTOMS THAT SHOULD BE REPORTED IMMEDIATELY: *FEVER GREATER THAN 100.4 F (38 C) OR HIGHER *CHILLS OR SWEATING *NAUSEA AND VOMITING THAT IS NOT CONTROLLED WITH YOUR NAUSEA MEDICATION *UNUSUAL SHORTNESS OF BREATH *UNUSUAL BRUISING OR BLEEDING *URINARY PROBLEMS (pain or burning when urinating, or frequent urination) *BOWEL PROBLEMS (unusual diarrhea, constipation, pain near the anus) TENDERNESS IN MOUTH AND THROAT WITH OR WITHOUT PRESENCE OF ULCERS (sore throat, sores in mouth, or a toothache) UNUSUAL RASH, SWELLING OR PAIN  UNUSUAL VAGINAL DISCHARGE OR ITCHING   Items with * indicate a potential emergency and should be followed up as soon as possible or go to the Emergency Department if any problems should occur.  Please show the CHEMOTHERAPY ALERT CARD or IMMUNOTHERAPY ALERT CARD at check-in to the Emergency Department and triage nurse.  Should you have questions after your visit or need to cancel or reschedule your appointment, please contact [facility] [phone]  and follow the prompts.  Office hours are 8:00 a.m. to 4:30 p.m. Monday - Friday. Please note that voicemails left after 4:00 p.m. may not be returned until the following business day.  We are closed weekends and major holidays. You have access to a nurse at all times for urgent questions. Please call the main number to the clinic [phone] and follow the prompts.  For any non-urgent questions, you may also contact your provider using MyChart. We now offer e-Visits for anyone 80 and older to request care online for non-urgent symptoms. For details visit  [email or web address].   Also download the MyChart app! Go to the app store, search "MyChart", open the app, select [place], and log in with your MyChart username and password.

## 2022-05-20 NOTE — Progress Notes (Signed)
Patient presents today for 1 units of blood. Vitals signs stable. Patient has no complaints of any changes since the last visit. MAR reviewed and updated. Patient has taken her pain medication prior to arrival for right hip pain rated a 6/10.   1 unit of blood given today per MD orders. Tolerated infusion without adverse affects. Vital signs stable. No complaints at this time. Discharged from clinic ambulatory in stable condition. Alert and oriented x 3. F/U with [facility] as scheduled.

## 2022-05-22 ENCOUNTER — Other Ambulatory Visit: Payer: Self-pay | Admitting: Family Medicine

## 2022-05-22 LAB — TYPE AND SCREEN
Donor AG Type: NEGATIVE
Unit division: 0

## 2022-05-23 ENCOUNTER — Telehealth: Payer: Self-pay

## 2022-05-23 ENCOUNTER — Other Ambulatory Visit: Payer: Self-pay | Admitting: Urology

## 2022-05-23 DIAGNOSIS — D47Z9 Other specified neoplasms of uncertain behavior of lymphoid, hematopoietic and related tissue: Secondary | ICD-10-CM | POA: Diagnosis not present

## 2022-05-23 DIAGNOSIS — Z515 Encounter for palliative care: Secondary | ICD-10-CM | POA: Diagnosis not present

## 2022-05-23 DIAGNOSIS — N390 Urinary tract infection, site not specified: Secondary | ICD-10-CM

## 2022-05-23 LAB — BPAM RBC
Blood Product Expiration Date: 202405132359
ISSUE DATE / TIME: 202404121020
Unit Type and Rh: 5100

## 2022-05-23 LAB — TYPE AND SCREEN
ABO/RH(D): O POS
Antibody Screen: POSITIVE
Unit division: 0

## 2022-05-23 MED ORDER — NITROFURANTOIN MONOHYD MACRO 100 MG PO CAPS
100.0000 mg | ORAL_CAPSULE | Freq: Two times a day (BID) | ORAL | 0 refills | Status: AC
Start: 2022-05-23 — End: 2022-05-30

## 2022-05-23 NOTE — Telephone Encounter (Signed)
-----   Message from Marcine Matar, MD sent at 05/23/2022  9:51 AM EDT ----- Let patient know that bacteria grew from urine, I sent antibiotic in. ----- Message ----- From: Troy Sine, CMA Sent: 05/23/2022   7:57 AM EDT To: Marcine Matar, MD  Please review

## 2022-05-23 NOTE — Telephone Encounter (Signed)
Tried calling patient with no answer. Left detailed message on patient answer machine making patient aware that her urine culture grew bacteria and an abt was sent in that should cover her infection. Patient is aware that she is to take Macrobid 100 mg bid X7 days.

## 2022-05-25 ENCOUNTER — Telehealth: Payer: Self-pay | Admitting: Family Medicine

## 2022-05-25 DIAGNOSIS — G709 Myoneural disorder, unspecified: Secondary | ICD-10-CM | POA: Diagnosis not present

## 2022-05-25 DIAGNOSIS — S60221A Contusion of right hand, initial encounter: Secondary | ICD-10-CM | POA: Diagnosis not present

## 2022-05-25 DIAGNOSIS — D631 Anemia in chronic kidney disease: Secondary | ICD-10-CM | POA: Diagnosis not present

## 2022-05-25 DIAGNOSIS — T148XXA Other injury of unspecified body region, initial encounter: Secondary | ICD-10-CM | POA: Diagnosis not present

## 2022-05-25 DIAGNOSIS — N183 Chronic kidney disease, stage 3 unspecified: Secondary | ICD-10-CM | POA: Diagnosis not present

## 2022-05-25 DIAGNOSIS — I129 Hypertensive chronic kidney disease with stage 1 through stage 4 chronic kidney disease, or unspecified chronic kidney disease: Secondary | ICD-10-CM | POA: Diagnosis not present

## 2022-05-25 DIAGNOSIS — C946 Myelodysplastic disease, not classified: Secondary | ICD-10-CM | POA: Diagnosis not present

## 2022-05-25 DIAGNOSIS — M329 Systemic lupus erythematosus, unspecified: Secondary | ICD-10-CM | POA: Diagnosis not present

## 2022-05-25 DIAGNOSIS — M25811 Other specified joint disorders, right shoulder: Secondary | ICD-10-CM | POA: Diagnosis not present

## 2022-05-25 DIAGNOSIS — S2231XA Fracture of one rib, right side, initial encounter for closed fracture: Secondary | ICD-10-CM | POA: Diagnosis not present

## 2022-05-25 NOTE — Telephone Encounter (Signed)
Annette call back # [phone]

## 2022-05-25 NOTE — Telephone Encounter (Signed)
Renee Waters and sent pt to the ER for evaluation. Reports severe pain in ribs and a swelling in abdomen

## 2022-05-25 NOTE — Telephone Encounter (Signed)
Renee Waters Called from [facility] for physical thearpy patient fell on Monday, 04.15.2024 and now have 9 out of 10 right side pain shourder to hip and swelling in rib area. Pain with deep breathe.  Teams message was sent to clinic staff

## 2022-05-31 ENCOUNTER — Telehealth: Payer: Self-pay | Admitting: Family Medicine

## 2022-05-31 DIAGNOSIS — R079 Chest pain, unspecified: Secondary | ICD-10-CM

## 2022-05-31 NOTE — Telephone Encounter (Signed)
Patient called asked if Dr Lodema Hong could order a sonogram

## 2022-06-01 ENCOUNTER — Encounter: Payer: Self-pay | Admitting: Family Medicine

## 2022-06-01 DIAGNOSIS — C946 Myelodysplastic disease, not classified: Secondary | ICD-10-CM | POA: Diagnosis not present

## 2022-06-01 DIAGNOSIS — D631 Anemia in chronic kidney disease: Secondary | ICD-10-CM | POA: Diagnosis not present

## 2022-06-01 DIAGNOSIS — N183 Chronic kidney disease, stage 3 unspecified: Secondary | ICD-10-CM | POA: Diagnosis not present

## 2022-06-01 DIAGNOSIS — I129 Hypertensive chronic kidney disease with stage 1 through stage 4 chronic kidney disease, or unspecified chronic kidney disease: Secondary | ICD-10-CM | POA: Diagnosis not present

## 2022-06-01 DIAGNOSIS — G709 Myoneural disorder, unspecified: Secondary | ICD-10-CM | POA: Diagnosis not present

## 2022-06-01 DIAGNOSIS — M329 Systemic lupus erythematosus, unspecified: Secondary | ICD-10-CM | POA: Diagnosis not present

## 2022-06-01 NOTE — Telephone Encounter (Signed)
I tried to speak with pt and had to leave a message. I have ordered a chest scan as this will be more informative,and more ppropriate, pls let her know this and schedule the test

## 2022-06-01 NOTE — Telephone Encounter (Signed)
Pt awaiting call back. Has not heard anything back\.

## 2022-06-02 NOTE — Telephone Encounter (Signed)
Patient aware.

## 2022-06-06 NOTE — Telephone Encounter (Signed)
Test has been scheduled

## 2022-06-06 NOTE — Progress Notes (Signed)
[facility] [address], [place] [address]    Clinic Day:  06/07/2022  Referring physician: Kerri Perches, MD  Patient Care Team: Kerri Perches, MD as PCP - General Branch, Alben Spittle, MD as PCP - Cardiology (Cardiology) Jena Gauss Gerrit Friends, MD (Gastroenterology) Vickki Hearing, MD as Consulting Physician (Orthopedic Surgery) [name], MD as Medical Oncologist (Oncology) Therese Sarah, RN as Oncology Nurse Navigator (Oncology) Clinton Gallant, RN as [facility] Care Management   ASSESSMENT & PLAN:   Assessment: 1. MPL positive prefibrotic/early primary myelofibrosis: - CBC on 11/06/2020 with white count 75.6, differential 59% neutrophils, 12% monocytes, 4% lymphocytes, 1 percentage of eosinophils and basophils, 6% band neutrophils, 12% myelocytes, 1% promyelocytes, 29% blasts. - Pathologist review of blood smear reported as leukoerythroblastic reaction. - 25 pound weight loss in the last 6 months, unintentional.  Decreased appetite.  Reports fatigue for the last few months.  Reports night sweats x3 in the last 1 month. - Bone marrow biopsy on 11/18/2020 consistent with granulocytic proliferation with differential including CMML versus myeloproliferative disorder. - BCR/ABL was negative. 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 - NGS myeloid panel shows mutations in ASXL1, MPL, TET2, EZH2 mutations. - Spleen ultrasound on 12/16/2020 shows mildly enlarged measuring 12.4 x 9.6 x 7 cm with volume of 435 cc. - PDGFR alpha, beta and FGFR 1 was negative. - She was evaluated by Dr. Lowell Guitar at [facility].  Slides were reviewed at [facility] hematopathology.  They thought it was less likely CMML and more likely MPL positive myeloproliferative neoplasm.  Dr. Lowell Guitar has recommended initiate Jakafi. - Given anemia, B symptoms, cytogenetics with MPL, ASLX1, normal karyotype she is intermediate risk on  GIPPS at high risk MIPSS70+ - Ruxolitinib 10 mg twice daily started on 02/16/2021.  Dose increased to 15 mg twice daily on 05/18/2021. - CTAP on 04/07/2021: Hepatomegaly and splenomegaly measuring 14.3 cm.  No other pathology.  No spleen infarcts. -Ruxolitinib dose increased to 15 mg twice daily on 09/20/2021. - Ultrasound spleen (09/21/2021): 13.6 cm with volume 532 mL.  This is slightly improved from previous volume. - Bone marrow biopsy (09/09/2021): Material is limited but overall features consistent with myeloid neoplasm.  No increase in blasts.  Erythroid precursors appear decreased but megakaryocytes are variably evident with clusters and small forms.  Reticulin's shows variable increase in reticulin fibers including areas with prominent increase.  Chromosome analysis was normal. - Tapering doses of ruxolitinib started on 12/24/2021, last dose on 01/23/2022 - Evaluated by Dr. Lowell Guitar on 12/24/2021. - Momelotinib 100 mg daily started on 01/26/2022  2.  Social/family history: - She lives at home and is able to do all her ADLs and IADLs although she is getting tired lately.  She reports quitting smoking 6 months ago and smoked 1 pack/week for 43 years. - She believes that her mother had some kind of leukemia.  No other malignancies.    Plan: MPL positive myeloproliferative neoplasm: - She is taking momelotinib 100 mg daily. - Physical exam today: Spleen palpable 5 to 6 fingerbreadths below the left costal margin. - Labs today: Hemoglobin 9.0.  White count 56 and platelet count 143.  LFTs are normal. - Ultrasound spleen on 05/05/2022: Spleen volume was 549 cc.  We will consider repeating it in June. - Continue more Nilotinib 100 mg daily. - Will check CBC in 3 weeks to see if any transfusion is required. - RTC 6 weeks  for follow-up with repeat labs and exam.  2.  Decreased appetite: - She has lost 2 pounds since last visit.  She has good appetite.  She was told to eat multiple small meals per  day.  3.  CKD: - Baseline creatinine of 1.3-1.6.  Today it is 1.51.  4.  Hypokalemia: - Continue potassium supplements.  Potassium today is 4.7.   5.  Generalized itching: - Itching has improved since momelotinib started.  Orders Placed This Encounter  Procedures   CBC with Differential/Platelet    Standing Status:   Future    Standing Expiration Date:   06/07/2023    Order Specific Question:   Release to patient    Answer:   Immediate   Comprehensive metabolic panel    Standing Status:   Future    Standing Expiration Date:   06/07/2023    Order Specific Question:   Release to patient    Answer:   Immediate   Magnesium    Standing Status:   Future    Standing Expiration Date:   06/07/2023    Order Specific Question:   Release to patient    Answer:   Immediate   Lactate dehydrogenase    Standing Status:   Future    Standing Expiration Date:   06/07/2023    Order Specific Question:   Release to patient    Answer:   Immediate   CBC with Differential/Platelet    Standing Status:   Future    Standing Expiration Date:   06/07/2023    Order Specific Question:   Release to patient    Answer:   Immediate   Sample to Blood Bank(Blood Bank Hold)    Standing Status:   Future    Standing Expiration Date:   06/07/2023      I,Katie Daubenspeck,acting as a scribe for [name], MD.,have documented all relevant documentation on the behalf of [name], MD,as directed by  [name], MD while in the presence of [name], MD.   I, [name] MD, have reviewed the above documentation for accuracy and completeness, and I agree with the above.   [name], MD   4/30/20245:09 PM  CHIEF COMPLAINT:   Diagnosis: MPL positive myeloproliferative neoplasm    Cancer Staging  No matching staging information was found for the patient.   Prior Therapy: Jakafi 10 mg twice daily   Current Therapy:  Momelotinib 100 mg daily    HISTORY  OF PRESENT ILLNESS:   Oncology History   No history exists.     INTERVAL HISTORY:   Renee Waters is a 77 y.o. female presenting to clinic today for follow up of MPL positive myeloproliferative neoplasm. She was last seen by me on 04/21/22.  Since her last visit, she underwent spleen US on 05/05/22 showing: splenomegaly with length >13 cm and volume >411 cc.  Today, she states that she is doing well overall. Her appetite level is at 75%. Her energy level is at 65%.  PAST MEDICAL HISTORY:   Past Medical History: Past Medical History:  Diagnosis Date   Acute cholangitis    Acute respiratory failure with hypoxia (HCC) 09/01/2021   Allergy    Anemia    Anxiety    Arthritis    Phreesia 03/18/2020   Barrett's esophagus    Cataract    Chronic back pain    Chronic neck pain    CKD (chronic kidney disease) stage 3, GFR 30-59 ml/min (HCC) 10/29/2020   Depression  Genital herpes    GERD (gastroesophageal reflux disease)    H/O degenerative disc disease    History of blood transfusion    Hypertension    Hypoxia 12/01/2021   Insomnia    Lupus (systemic lupus erythematosus) (HCC)    Neuromuscular disorder (HCC)    Osteoarthritis    S/P colonoscopy 07/2003   normal, no polyps   S/P endoscopy June 2005, Oct 2009   2005: short-segment Barrett's, 2009: short-segment Barrett's   Upper respiratory tract infection 07/09/2020   UTI (lower urinary tract infection) 11/2012    Surgical History: Past Surgical History:  Procedure Laterality Date   ABDOMINAL HYSTERECTOMY     BACK SURGERY     BIOPSY N/A 03/20/2014   Procedure: BIOPSY;  Surgeon: Corbin Ade, MD;  Location: [facility];  Service: Endoscopy;  Laterality: N/A;   BIOPSY  09/14/2015   Procedure: BIOPSY;  Surgeon: Corbin Ade, MD;  Location: [facility];  Service: Endoscopy;;  esophageal and gastric   BIOPSY  07/23/2019   Procedure: BIOPSY;  Surgeon: Corbin Ade, MD;  Location: [facility];  Service: Endoscopy;;  esophageal     CARPAL TUNNEL RELEASE Left 2013   cervical disectomy  2002   CESAREAN SECTION N/A    Phreesia 03/18/2020   CHOLECYSTECTOMY     with lysis of adhesions for sbo; "ruptured gallbladder".   COLONOSCOPY  11/09/2011   RMR: Melanosis coli   COLONOSCOPY WITH PROPOFOL N/A 09/14/2015   Dr. Jena Gauss: diverticulosis, 3mm TA removed. next TCS 09/2020.    COLONOSCOPY WITH PROPOFOL N/A 04/30/2020   Procedure: COLONOSCOPY WITH PROPOFOL;  Surgeon: Corbin Ade, MD;  Location: [facility];  Service: Endoscopy;  Laterality: N/A;  PM (ASA 3)   DENTAL SURGERY  11/2015   multiple tooth extraction   ESOPHAGOGASTRODUODENOSCOPY  11/29/2007   salmon-colored  tongue   longest stable at  3 cm, distal esophagus as described previously status post biopsy/ Hiatal hernia, otherwise normal stomach D1 and D2   ESOPHAGOGASTRODUODENOSCOPY  01/06/11   short segment Barrett's esophagus s/p bx/Hiatal hernia   ESOPHAGOGASTRODUODENOSCOPY (EGD) WITH PROPOFOL N/A 03/20/2014   RUE:[id number] distal esophagus short segment barrett's, bx with no dysplasia. next egd in 03/2017   ESOPHAGOGASTRODUODENOSCOPY (EGD) WITH PROPOFOL N/A 09/14/2015   Dr. Jena Gauss: Barrett's without dysplasia, gastritis benign bx, hiatal hernia. next EGD 09/2018.   ESOPHAGOGASTRODUODENOSCOPY (EGD) WITH PROPOFOL N/A 07/23/2019   Procedure: ESOPHAGOGASTRODUODENOSCOPY (EGD) WITH PROPOFOL;  Surgeon: Corbin Ade, MD;  Location: [facility];  Service: Endoscopy;  Laterality: N/A;  3:00pm   EYE SURGERY N/A    Phreesia 03/18/2020   HERNIA REPAIR Right 07/2010   Dr. Arlean Hopping   IR IMAGING GUIDED PORT INSERTION  02/09/2021   JOINT REPLACEMENT     LAPAROSCOPIC CHOLECYSTECTOMY  2017   at [facility]   POLYPECTOMY  09/14/2015   Procedure: POLYPECTOMY;  Surgeon: Corbin Ade, MD;  Location: [facility];  Service: Endoscopy;;  ascending colon   right hip replacement  07/2010   went back in sept 2012 to fix   SHOULDER ARTHROSCOPY  2008   left   SPINE SURGERY N/A    Phreesia  03/18/2020   TOTAL HIP REVISION Right 12/17/2012   Procedure: RIGHT TOTAL HIP REVISION;  Surgeon: [name], MD;  Location: [facility];  Service: Orthopedics;  Laterality: Right;   WRIST SURGERY Right 2011   open reduction right wrist.    Social History: Social History   Socioeconomic History  Marital status: Married    Spouse name: louis   Number of children: 4   Years of education: 12+   Highest education level: Some college, no degree  Occupational History   Occupation: Audiological scientist  - retired   Occupation: non profit  Tobacco Use   Smoking status: Former    Packs/day: 0.25    Years: 25.00    Additional pack years: 0.00    Total pack years: 6.25    Types: Cigarettes    Quit date: 02/07/2003    Years since quitting: 19.3   Smokeless tobacco: Never   Tobacco comments:    quit in 2004  Vaping Use   Vaping Use: Never used  Substance and Sexual Activity   Alcohol use: No   Drug use: No   Sexual activity: Not Currently    Birth control/protection: Surgical  Other Topics Concern   Not on file  Social History Narrative   Not on file   Social Determinants of Health   Financial Resource Strain: Low Risk  (12/28/2020)   Overall Financial Resource Strain (CARDIA)    Difficulty of Paying Living Expenses: Not hard at all  Food Insecurity: No Food Insecurity (02/08/2022)   Hunger Vital Sign    Worried About Running Out of Food in the Last Year: Never true    Ran Out of Food in the Last Year: Never true  Transportation Needs: No Transportation Needs (02/11/2022)   PRAPARE - Administrator, Civil Service (Medical): No    Lack of Transportation (Non-Medical): No  Physical Activity: Inactive (04/23/2021)   Exercise Vital Sign    Days of Exercise per Week: 0 days    Minutes of Exercise per Session: 0 min  Stress: Stress Concern Present (04/23/2021)   [employer] of Occupational Health - Occupational Stress Questionnaire    Feeling of Stress : Rather much   Social Connections: Moderately Integrated (12/28/2020)   Social Connection and Isolation Panel [NHANES]    Frequency of Communication with Friends and Family: More than three times a week    Frequency of Social Gatherings with Friends and Family: Once a week    Attends Religious Services: More than 4 times per year    Active Member of [employer] or Organizations: No    Attends Banker Meetings: Never    Marital Status: Married  Catering manager Violence: Not At Risk (02/08/2022)   Humiliation, Afraid, Rape, and Kick questionnaire    Fear of Current or Ex-Partner: No    Emotionally Abused: No    Physically Abused: No    Sexually Abused: No    Family History: Family History  Problem Relation Age of Onset   Hypertension Mother    Stroke Mother    Colon cancer Neg Hx    Anesthesia problems Neg Hx    Hypotension Neg Hx    Malignant hyperthermia Neg Hx    Pseudochol deficiency Neg Hx    Gastric cancer Neg Hx    Esophageal cancer Neg Hx     Current Medications:  Current Outpatient Medications:    acyclovir (ZOVIRAX) 800 MG tablet, Take 1 tablet (800 mg total) by mouth 5 (five) times daily as needed., Disp: 50 tablet, Rfl: 1   acyclovir ointment (ZOVIRAX) 5 %, Apply 1 Application topically every 3 (three) hours as needed., Disp: 15 g, Rfl: 1   Allopurinol 200 MG TABS, Take 200 mg by mouth daily., Disp: 30 tablet, Rfl: 3   busPIRone (BUSPAR) 5 MG tablet,  Take 1 tablet (5 mg total) by mouth daily as needed., Disp: 30 tablet, Rfl: 5   cetirizine (ZYRTEC) 10 MG tablet, Take 1 tablet by mouth once daily, Disp: 30 tablet, Rfl: 0   citalopram (CELEXA) 20 MG tablet, Take 1 tablet by mouth once daily, Disp: 30 tablet, Rfl: 0   EPINEPHrine 0.3 mg/0.3 mL IJ SOAJ injection, INJECT 0.3 MLS INTO MUSCLE ONCE AS NEEDED, Disp: 1 each, Rfl: 2   furosemide (LASIX) 20 MG tablet, TAKE 1 TABLET BY MOUTH ONCE DAILY AS NEEDED, Disp: 30 tablet, Rfl: 3   hydrOXYzine (ATARAX) 25 MG tablet, Take 12.5-25  mg by mouth 2 (two) times daily as needed., Disp: , Rfl:    lidocaine-prilocaine (EMLA) cream, Apply 1 Application topically as needed (Access to port and pain)., Disp: 30 g, Rfl: 6   metoprolol tartrate (LOPRESSOR) 25 MG tablet, Take one and a half tablets twice daily, Disp: 180 tablet, Rfl: 3   mirtazapine (REMERON) 15 MG tablet, Take 1 tablet (15 mg total) by mouth at bedtime., Disp: 30 tablet, Rfl: 5   momelotinib dihydrochloride (OJJAARA) 100 MG tablet, Take 1 tablet (100 mg) by mouth daily., Disp: 30 tablet, Rfl: 6   Momelotinib Dihydrochloride (OJJAARA) 100 MG TABS, Take 100 mg by mouth daily., Disp: , Rfl:    Multiple Vitamin (MULTIVITAMIN WITH MINERALS) TABS tablet, Take 1 tablet by mouth daily., Disp: , Rfl:    ondansetron (ZOFRAN-ODT) 8 MG disintegrating tablet, Take 1 tablet (8 mg total) by mouth every 8 (eight) hours as needed for nausea or vomiting., Disp: 20 tablet, Rfl: 0   oxyCODONE (ROXICODONE) 15 MG immediate release tablet, Take 15 mg by mouth every 6 (six) hours as needed., Disp: , Rfl:    pantoprazole (PROTONIX) 40 MG tablet, TAKE TABELT BY MOUTH 30-60 MINUTES BEFORE YOUR FIRST AND LAST MEALS OF THE DAY, Disp: 60 tablet, Rfl: 2   potassium chloride (KLOR-CON) 10 MEQ tablet, Take 1 tablet by mouth once daily, Disp: 30 tablet, Rfl: 4   prochlorperazine (COMPAZINE) 10 MG tablet, Take 1 tablet (10 mg total) by mouth every 6 (six) hours as needed for nausea or vomiting., Disp: 60 tablet, Rfl: 1   temazepam (RESTORIL) 30 MG capsule, Take 1 capsule (30 mg total) by mouth at bedtime as needed for sleep., Disp: 30 capsule, Rfl: 5   tiZANidine (ZANAFLEX) 4 MG tablet, Take 1 tablet (4 mg total) by mouth every 8 (eight) hours as needed for muscle spasms., Disp: 30 tablet, Rfl: 0 No current facility-administered medications for this visit.  Facility-Administered Medications Ordered in Other Visits:    lanreotide acetate (SOMATULINE DEPOT) 120 MG/0.5ML injection, , , ,    lanreotide acetate  (SOMATULINE DEPOT) 120 MG/0.5ML injection, , , ,    lanreotide acetate (SOMATULINE DEPOT) 120 MG/0.5ML injection, , , ,    octreotide (SANDOSTATIN LAR) 30 MG IM injection, , , ,    octreotide (SANDOSTATIN LAR) 30 MG IM injection, , , ,    Allergies: Allergies  Allergen Reactions   Bee Venom Swelling and Hives   Norvasc [Amlodipine] Other (See Comments)    Hair loss   Tylenol [Acetaminophen] Itching   Hydralazine Other (See Comments)    Hair loss   Red Dye Itching   Percocet [Oxycodone-Acetaminophen] Rash and Other (See Comments)    Pt states, "this gives her a rash, but at home she takes oxycodone for pain relief"    Tyloxapol Itching and Rash    REVIEW OF SYSTEMS:  Review of Systems  Constitutional:  Negative for chills, fatigue and fever.  HENT:   Negative for lump/mass, mouth sores, nosebleeds, sore throat and trouble swallowing.   Eyes:  Negative for eye problems.  Respiratory:  Positive for cough and shortness of breath.   Cardiovascular:  Negative for chest pain, leg swelling and palpitations.  Gastrointestinal:  Positive for nausea. Negative for abdominal pain, constipation, diarrhea and vomiting.  Genitourinary:  Negative for bladder incontinence, difficulty urinating, dysuria, frequency, hematuria and nocturia.   Musculoskeletal:  Negative for arthralgias, back pain, flank pain, myalgias and neck pain.  Skin:  Negative for itching and rash.  Neurological:  Negative for dizziness, headaches and numbness.  Hematological:  Does not bruise/bleed easily.  Psychiatric/Behavioral:  Negative for depression, sleep disturbance and suicidal ideas. The patient is not nervous/anxious.   All other systems reviewed and are negative.    VITALS:   Weight 115 lb 9.6 oz (52.4 kg).  Wt Readings from Last 3 Encounters:  06/07/22 115 lb 9.6 oz (52.4 kg)  04/20/22 118 lb (53.5 kg)  03/02/22 123 lb 6.4 oz (56 kg)    Body mass index is 19.24 kg/m.  Performance status (ECOG): 1 -  Symptomatic but completely ambulatory  PHYSICAL EXAM:   Physical Exam Vitals and nursing note reviewed. Exam conducted with a chaperone present.  Constitutional:      Appearance: Normal appearance.  Cardiovascular:     Rate and Rhythm: Normal rate and regular rhythm.     Pulses: Normal pulses.     Heart sounds: Normal heart sounds.  Pulmonary:     Effort: Pulmonary effort is normal.     Breath sounds: Normal breath sounds.  Abdominal:     Palpations: Abdomen is soft. There is no hepatomegaly, splenomegaly or mass.     Tenderness: There is no abdominal tenderness.  Musculoskeletal:     Right lower leg: No edema.     Left lower leg: No edema.  Lymphadenopathy:     Cervical: No cervical adenopathy.     Right cervical: No superficial, deep or posterior cervical adenopathy.    Left cervical: No superficial, deep or posterior cervical adenopathy.     Upper Body:     Right upper body: No supraclavicular or axillary adenopathy.     Left upper body: No supraclavicular or axillary adenopathy.  Neurological:     General: No focal deficit present.     Mental Status: She is alert and oriented to person, place, and time.  Psychiatric:        Mood and Affect: Mood normal.        Behavior: Behavior normal.     LABS:      Latest Ref Rng & Units 06/07/2022    1:12 PM 05/19/2022    1:06 PM 05/05/2022    1:24 PM  CBC  WBC 4.0 - 10.5 K/uL 56.6  52.6  62.7   Hemoglobin 12.0 - 15.0 g/dL 9.0  7.4  8.1   Hematocrit 36.0 - 46.0 % 28.7  23.1  25.4   Platelets 150 - 400 K/uL 143  147  137       Latest Ref Rng & Units 06/07/2022    1:12 PM 04/21/2022    1:52 PM 03/28/2022    2:34 PM  CMP  Glucose 70 - 99 mg/dL 89  161  096   BUN 8 - 23 mg/dL 34  38  39   Creatinine 0.44 - 1.00 mg/dL 0.45  4.09  8.11  Sodium 135 - 145 mmol/L 135  133  134   Potassium 3.5 - 5.1 mmol/L 4.7  4.2  4.6   Chloride 98 - 111 mmol/L 107  104  108   CO2 22 - 32 mmol/L 22  20  19    Calcium 8.9 - 10.3 mg/dL 8.3  8.5   8.6   Total Protein 6.5 - 8.1 g/dL 7.8  8.2  8.1   Total Bilirubin 0.3 - 1.2 mg/dL 0.8  0.8  0.6   Alkaline Phos 38 - 126 U/L 175  135  138   AST 15 - 41 U/L 30  34  32   ALT 0 - 44 U/L 24  20  25       No results found for: "CEA1", "CEA" / No results found for: "CEA1", "CEA" No results found for: "PSA1" No results found for: "ZOX096" No results found for: "CAN125"  No results found for: "TOTALPROTELP", "ALBUMINELP", "A1GS", "A2GS", "BETS", "BETA2SER", "GAMS", "MSPIKE", "SPEI" Lab Results  Component Value Date   TIBC 329 08/25/2015   TIBC 336 02/21/2011   FERRITIN 26 08/25/2015   FERRITIN 81 02/21/2011   IRONPCTSAT 8 (L) 08/25/2015   IRONPCTSAT 23 02/21/2011   Lab Results  Component Value Date   LDH 620 (H) 06/07/2022   LDH 832 (H) 04/21/2022   LDH 637 (H) 03/28/2022     STUDIES:   No results found.

## 2022-06-07 ENCOUNTER — Inpatient Hospital Stay (HOSPITAL_BASED_OUTPATIENT_CLINIC_OR_DEPARTMENT_OTHER): Payer: Medicare Other | Admitting: Hematology

## 2022-06-07 ENCOUNTER — Inpatient Hospital Stay: Payer: Medicare Other

## 2022-06-07 VITALS — BP 139/66 | HR 49 | Temp 97.1°F | Resp 16

## 2022-06-07 VITALS — Wt 115.6 lb

## 2022-06-07 DIAGNOSIS — D471 Chronic myeloproliferative disease: Secondary | ICD-10-CM | POA: Diagnosis not present

## 2022-06-07 DIAGNOSIS — D649 Anemia, unspecified: Secondary | ICD-10-CM

## 2022-06-07 DIAGNOSIS — Z95828 Presence of other vascular implants and grafts: Secondary | ICD-10-CM

## 2022-06-07 DIAGNOSIS — Z79899 Other long term (current) drug therapy: Secondary | ICD-10-CM | POA: Diagnosis not present

## 2022-06-07 DIAGNOSIS — R161 Splenomegaly, not elsewhere classified: Secondary | ICD-10-CM

## 2022-06-07 DIAGNOSIS — Z87891 Personal history of nicotine dependence: Secondary | ICD-10-CM | POA: Diagnosis not present

## 2022-06-07 DIAGNOSIS — N183 Chronic kidney disease, stage 3 unspecified: Secondary | ICD-10-CM | POA: Diagnosis not present

## 2022-06-07 DIAGNOSIS — E876 Hypokalemia: Secondary | ICD-10-CM | POA: Diagnosis not present

## 2022-06-07 LAB — CBC WITH DIFFERENTIAL/PLATELET
Abs Immature Granulocytes: 13.6 10*3/uL — ABNORMAL HIGH (ref 0.00–0.07)
Band Neutrophils: 8 %
Basophils Absolute: 0 10*3/uL (ref 0.0–0.1)
Basophils Relative: 0 %
Eosinophils Absolute: 0 10*3/uL (ref 0.0–0.5)
Eosinophils Relative: 0 %
HCT: 28.7 % — ABNORMAL LOW (ref 36.0–46.0)
Hemoglobin: 9 g/dL — ABNORMAL LOW (ref 12.0–15.0)
Lymphocytes Relative: 9 %
Lymphs Abs: 5.1 10*3/uL — ABNORMAL HIGH (ref 0.7–4.0)
MCH: 30 pg (ref 26.0–34.0)
MCHC: 31.4 g/dL (ref 30.0–36.0)
MCV: 95.7 fL (ref 80.0–100.0)
Metamyelocytes Relative: 11 %
Monocytes Absolute: 4.5 10*3/uL — ABNORMAL HIGH (ref 0.1–1.0)
Monocytes Relative: 8 %
Myelocytes: 9 %
Neutro Abs: 33.4 10*3/uL — ABNORMAL HIGH (ref 1.7–7.7)
Neutrophils Relative %: 51 %
Platelets: 143 10*3/uL — ABNORMAL LOW (ref 150–400)
Promyelocytes Relative: 4 %
RBC: 3 MIL/uL — ABNORMAL LOW (ref 3.87–5.11)
RDW: 21.2 % — ABNORMAL HIGH (ref 11.5–15.5)
WBC: 56.6 10*3/uL (ref 4.0–10.5)
nRBC: 11 /100 WBC — ABNORMAL HIGH
nRBC: 4.6 % — ABNORMAL HIGH (ref 0.0–0.2)

## 2022-06-07 LAB — COMPREHENSIVE METABOLIC PANEL
ALT: 24 U/L (ref 0–44)
AST: 30 U/L (ref 15–41)
Albumin: 3.8 g/dL (ref 3.5–5.0)
Alkaline Phosphatase: 175 U/L — ABNORMAL HIGH (ref 38–126)
Anion gap: 6 (ref 5–15)
BUN: 34 mg/dL — ABNORMAL HIGH (ref 8–23)
CO2: 22 mmol/L (ref 22–32)
Calcium: 8.3 mg/dL — ABNORMAL LOW (ref 8.9–10.3)
Chloride: 107 mmol/L (ref 98–111)
Creatinine, Ser: 1.51 mg/dL — ABNORMAL HIGH (ref 0.44–1.00)
GFR, Estimated: 36 mL/min — ABNORMAL LOW (ref >60)
Glucose, Bld: 89 mg/dL (ref 70–99)
Potassium: 4.7 mmol/L (ref 3.5–5.1)
Sodium: 135 mmol/L (ref 135–145)
Total Bilirubin: 0.8 mg/dL (ref 0.3–1.2)
Total Protein: 7.8 g/dL (ref 6.5–8.1)

## 2022-06-07 LAB — SAMPLE TO BLOOD BANK

## 2022-06-07 LAB — LACTATE DEHYDROGENASE: LDH: 620 U/L — ABNORMAL HIGH (ref 98–192)

## 2022-06-07 LAB — URIC ACID: Uric Acid, Serum: 4 mg/dL (ref 2.5–7.1)

## 2022-06-07 MED ORDER — SODIUM CHLORIDE 0.9% FLUSH
10.0000 mL | Freq: Once | INTRAVENOUS | Status: AC
  Administered 2022-06-07: 10 mL via INTRAVENOUS

## 2022-06-07 MED ORDER — HEPARIN SOD (PORK) LOCK FLUSH 100 UNIT/ML IV SOLN
500.0000 [IU] | Freq: Once | INTRAVENOUS | Status: AC
  Administered 2022-06-07: 500 [IU] via INTRAVENOUS

## 2022-06-07 NOTE — Patient Instructions (Signed)
[facility]  Discharge Instructions: Thank you for choosing [facility] to provide your oncology and hematology care.  If you have a lab appointment with the Cancer Center - please note that after April 8th, 2024, all labs will be drawn in the cancer center.  You do not have to check in or register with the main entrance as you have in the past but will complete your check-in in the cancer center.  Wear comfortable clothing and clothing appropriate for easy access to any Portacath or PICC line.   We strive to give you quality time with your provider. You may need to reschedule your appointment if you arrive late (15 or more minutes).  Arriving late affects you and other patients whose appointments are after yours.  Also, if you miss three or more appointments without notifying the office, you may be dismissed from the clinic at the provider's discretion.      For prescription refill requests, have your pharmacy contact our office and allow 72 hours for refills to be completed.    Today you received the following port flush and lab draw, return as scheduled.   To help prevent nausea and vomiting after your treatment, we encourage you to take your nausea medication as directed.  BELOW ARE SYMPTOMS THAT SHOULD BE REPORTED IMMEDIATELY: *FEVER GREATER THAN 100.4 F (38 C) OR HIGHER *CHILLS OR SWEATING *NAUSEA AND VOMITING THAT IS NOT CONTROLLED WITH YOUR NAUSEA MEDICATION *UNUSUAL SHORTNESS OF BREATH *UNUSUAL BRUISING OR BLEEDING *URINARY PROBLEMS (pain or burning when urinating, or frequent urination) *BOWEL PROBLEMS (unusual diarrhea, constipation, pain near the anus) TENDERNESS IN MOUTH AND THROAT WITH OR WITHOUT PRESENCE OF ULCERS (sore throat, sores in mouth, or a toothache) UNUSUAL RASH, SWELLING OR PAIN  UNUSUAL VAGINAL DISCHARGE OR ITCHING   Items with * indicate a potential emergency and should be followed up as soon as possible or go to the Emergency  Department if any problems should occur.  Please show the CHEMOTHERAPY ALERT CARD or IMMUNOTHERAPY ALERT CARD at check-in to the Emergency Department and triage nurse.  Should you have questions after your visit or need to cancel or reschedule your appointment, please contact [facility] [phone]  and follow the prompts.  Office hours are 8:00 a.m. to 4:30 p.m. Monday - Friday. Please note that voicemails left after 4:00 p.m. may not be returned until the following business day.  We are closed weekends and major holidays. You have access to a nurse at all times for urgent questions. Please call the main number to the clinic [phone] and follow the prompts.  For any non-urgent questions, you may also contact your provider using MyChart. We now offer e-Visits for anyone 68 and older to request care online for non-urgent symptoms. For details visit [email or web address].   Also download the MyChart app! Go to the app store, search "MyChart", open the app, select [place], and log in with your MyChart username and password.

## 2022-06-07 NOTE — Progress Notes (Signed)
Port flushed with good blood return noted. No bruising or swelling at site. Bandaid applied and patient discharged in satisfactory condition. VVS stable with no signs or symptoms of distressed noted. 

## 2022-06-07 NOTE — Patient Instructions (Signed)
[facility]  Discharge Instructions  You were seen and examined today by Dr. Ellin Saba.  Dr. Ellin Saba discussed your most recent lab work which revealed that everything looks good and stable.  Follow-up as scheduled.    Thank you for choosing [facility] to provide your oncology and hematology care.   To afford each patient quality time with our provider, please arrive at least 15 minutes before your scheduled appointment time. You may need to reschedule your appointment if you arrive late (10 or more minutes). Arriving late affects you and other patients whose appointments are after yours.  Also, if you miss three or more appointments without notifying the office, you may be dismissed from the clinic at the provider's discretion.    Again, thank you for choosing [facility].  Our hope is that these requests will decrease the amount of time that you wait before being seen by our physicians.   If you have a lab appointment with the Cancer Center - please note that after April 8th, all labs will be drawn in the cancer center.  You do not have to check in or register with the main entrance as you have in the past but will complete your check-in at the cancer center.            _____________________________________________________________  Should you have questions after your visit to [facility], please contact our office at [phone] and follow the prompts.  Our office hours are 8:00 a.m. to 4:30 p.m. Monday - Thursday and 8:00 a.m. to 2:30 p.m. Friday.  Please note that voicemails left after 4:00 p.m. may not be returned until the following business day.  We are closed weekends and all major holidays.  You do have access to a nurse 24-7, just call the main number to the clinic [phone] and do not press any options, hold on the line and a nurse will answer the phone.    For prescription refill requests,  have your pharmacy contact our office and allow 72 hours.    Masks are no longer required in the cancer centers. If you would like for your care team to wear a mask while they are taking care of you, please let them know. You may have one support person who is at least 77 years old accompany you for your appointments.

## 2022-06-07 NOTE — Progress Notes (Signed)
CRITICAL VALUE ALERT Critical value received:  WBC 56.6 K. Date of notification:  06-07-2022. Time of notification: 13:49 pm. Critical value read back:  Yes.   Nurse who received alert:  B.Dicy Smigel RN MD notified time and response:  Dr. Ellin Saba / A.Anderson RN @ 1:51pm. Patient has follow up appointment with Dr.Katragadda. No new orders received.

## 2022-06-09 DIAGNOSIS — G709 Myoneural disorder, unspecified: Secondary | ICD-10-CM | POA: Diagnosis not present

## 2022-06-09 DIAGNOSIS — M329 Systemic lupus erythematosus, unspecified: Secondary | ICD-10-CM | POA: Diagnosis not present

## 2022-06-09 DIAGNOSIS — I129 Hypertensive chronic kidney disease with stage 1 through stage 4 chronic kidney disease, or unspecified chronic kidney disease: Secondary | ICD-10-CM | POA: Diagnosis not present

## 2022-06-09 DIAGNOSIS — C946 Myelodysplastic disease, not classified: Secondary | ICD-10-CM | POA: Diagnosis not present

## 2022-06-09 DIAGNOSIS — N183 Chronic kidney disease, stage 3 unspecified: Secondary | ICD-10-CM | POA: Diagnosis not present

## 2022-06-09 DIAGNOSIS — D631 Anemia in chronic kidney disease: Secondary | ICD-10-CM | POA: Diagnosis not present

## 2022-06-12 ENCOUNTER — Other Ambulatory Visit: Payer: Self-pay | Admitting: Family Medicine

## 2022-06-14 ENCOUNTER — Other Ambulatory Visit (HOSPITAL_COMMUNITY): Payer: Self-pay

## 2022-06-14 DIAGNOSIS — N183 Chronic kidney disease, stage 3 unspecified: Secondary | ICD-10-CM | POA: Diagnosis not present

## 2022-06-14 DIAGNOSIS — G709 Myoneural disorder, unspecified: Secondary | ICD-10-CM | POA: Diagnosis not present

## 2022-06-14 DIAGNOSIS — M549 Dorsalgia, unspecified: Secondary | ICD-10-CM | POA: Diagnosis not present

## 2022-06-14 DIAGNOSIS — F32A Depression, unspecified: Secondary | ICD-10-CM | POA: Diagnosis not present

## 2022-06-14 DIAGNOSIS — M519 Unspecified thoracic, thoracolumbar and lumbosacral intervertebral disc disorder: Secondary | ICD-10-CM | POA: Diagnosis not present

## 2022-06-14 DIAGNOSIS — Z87440 Personal history of urinary (tract) infections: Secondary | ICD-10-CM | POA: Diagnosis not present

## 2022-06-14 DIAGNOSIS — M1990 Unspecified osteoarthritis, unspecified site: Secondary | ICD-10-CM | POA: Diagnosis not present

## 2022-06-14 DIAGNOSIS — K2270 Barrett's esophagus without dysplasia: Secondary | ICD-10-CM | POA: Diagnosis not present

## 2022-06-14 DIAGNOSIS — K219 Gastro-esophageal reflux disease without esophagitis: Secondary | ICD-10-CM | POA: Diagnosis not present

## 2022-06-14 DIAGNOSIS — D696 Thrombocytopenia, unspecified: Secondary | ICD-10-CM | POA: Diagnosis not present

## 2022-06-14 DIAGNOSIS — G4700 Insomnia, unspecified: Secondary | ICD-10-CM | POA: Diagnosis not present

## 2022-06-14 DIAGNOSIS — F419 Anxiety disorder, unspecified: Secondary | ICD-10-CM | POA: Diagnosis not present

## 2022-06-14 DIAGNOSIS — M542 Cervicalgia: Secondary | ICD-10-CM | POA: Diagnosis not present

## 2022-06-14 DIAGNOSIS — M329 Systemic lupus erythematosus, unspecified: Secondary | ICD-10-CM | POA: Diagnosis not present

## 2022-06-14 DIAGNOSIS — G8929 Other chronic pain: Secondary | ICD-10-CM | POA: Diagnosis not present

## 2022-06-14 DIAGNOSIS — Z87891 Personal history of nicotine dependence: Secondary | ICD-10-CM | POA: Diagnosis not present

## 2022-06-14 DIAGNOSIS — C946 Myelodysplastic disease, not classified: Secondary | ICD-10-CM | POA: Diagnosis not present

## 2022-06-14 DIAGNOSIS — I129 Hypertensive chronic kidney disease with stage 1 through stage 4 chronic kidney disease, or unspecified chronic kidney disease: Secondary | ICD-10-CM | POA: Diagnosis not present

## 2022-06-14 DIAGNOSIS — D631 Anemia in chronic kidney disease: Secondary | ICD-10-CM | POA: Diagnosis not present

## 2022-06-15 DIAGNOSIS — G709 Myoneural disorder, unspecified: Secondary | ICD-10-CM | POA: Diagnosis not present

## 2022-06-15 DIAGNOSIS — I129 Hypertensive chronic kidney disease with stage 1 through stage 4 chronic kidney disease, or unspecified chronic kidney disease: Secondary | ICD-10-CM | POA: Diagnosis not present

## 2022-06-15 DIAGNOSIS — C946 Myelodysplastic disease, not classified: Secondary | ICD-10-CM | POA: Diagnosis not present

## 2022-06-15 DIAGNOSIS — D631 Anemia in chronic kidney disease: Secondary | ICD-10-CM | POA: Diagnosis not present

## 2022-06-15 DIAGNOSIS — N183 Chronic kidney disease, stage 3 unspecified: Secondary | ICD-10-CM | POA: Diagnosis not present

## 2022-06-15 DIAGNOSIS — M329 Systemic lupus erythematosus, unspecified: Secondary | ICD-10-CM | POA: Diagnosis not present

## 2022-06-16 DIAGNOSIS — I129 Hypertensive chronic kidney disease with stage 1 through stage 4 chronic kidney disease, or unspecified chronic kidney disease: Secondary | ICD-10-CM

## 2022-06-16 DIAGNOSIS — M542 Cervicalgia: Secondary | ICD-10-CM

## 2022-06-16 DIAGNOSIS — N183 Chronic kidney disease, stage 3 unspecified: Secondary | ICD-10-CM | POA: Diagnosis not present

## 2022-06-16 DIAGNOSIS — G8929 Other chronic pain: Secondary | ICD-10-CM | POA: Diagnosis not present

## 2022-06-16 DIAGNOSIS — G709 Myoneural disorder, unspecified: Secondary | ICD-10-CM

## 2022-06-16 DIAGNOSIS — D631 Anemia in chronic kidney disease: Secondary | ICD-10-CM

## 2022-06-16 DIAGNOSIS — M329 Systemic lupus erythematosus, unspecified: Secondary | ICD-10-CM

## 2022-06-16 DIAGNOSIS — D696 Thrombocytopenia, unspecified: Secondary | ICD-10-CM

## 2022-06-16 DIAGNOSIS — M549 Dorsalgia, unspecified: Secondary | ICD-10-CM | POA: Diagnosis not present

## 2022-06-16 DIAGNOSIS — F419 Anxiety disorder, unspecified: Secondary | ICD-10-CM

## 2022-06-16 DIAGNOSIS — C946 Myelodysplastic disease, not classified: Secondary | ICD-10-CM

## 2022-06-16 DIAGNOSIS — F32A Depression, unspecified: Secondary | ICD-10-CM

## 2022-06-20 ENCOUNTER — Encounter: Payer: Self-pay | Admitting: *Deleted

## 2022-06-20 DIAGNOSIS — D47Z9 Other specified neoplasms of uncertain behavior of lymphoid, hematopoietic and related tissue: Secondary | ICD-10-CM | POA: Diagnosis not present

## 2022-06-20 DIAGNOSIS — Z515 Encounter for palliative care: Secondary | ICD-10-CM | POA: Diagnosis not present

## 2022-06-21 ENCOUNTER — Other Ambulatory Visit (HOSPITAL_COMMUNITY): Payer: Self-pay

## 2022-06-24 ENCOUNTER — Other Ambulatory Visit: Payer: Self-pay

## 2022-06-24 ENCOUNTER — Telehealth: Payer: Self-pay

## 2022-06-24 DIAGNOSIS — N183 Chronic kidney disease, stage 3 unspecified: Secondary | ICD-10-CM | POA: Diagnosis not present

## 2022-06-24 DIAGNOSIS — M329 Systemic lupus erythematosus, unspecified: Secondary | ICD-10-CM | POA: Diagnosis not present

## 2022-06-24 DIAGNOSIS — G709 Myoneural disorder, unspecified: Secondary | ICD-10-CM | POA: Diagnosis not present

## 2022-06-24 DIAGNOSIS — D631 Anemia in chronic kidney disease: Secondary | ICD-10-CM | POA: Diagnosis not present

## 2022-06-24 DIAGNOSIS — I129 Hypertensive chronic kidney disease with stage 1 through stage 4 chronic kidney disease, or unspecified chronic kidney disease: Secondary | ICD-10-CM | POA: Diagnosis not present

## 2022-06-24 DIAGNOSIS — C946 Myelodysplastic disease, not classified: Secondary | ICD-10-CM | POA: Diagnosis not present

## 2022-06-24 MED ORDER — HYDROXYZINE HCL 25 MG PO TABS: 12.5000 mg | ORAL_TABLET | Freq: Four times a day (QID) | ORAL | 2 refills | Status: DC | PRN

## 2022-06-24 NOTE — Telephone Encounter (Signed)
Patient called stating itching had worsen.  Physician notified and ordered Hydroxyzine 25mg  every 6 hrs as needed.  Patient called and notified medication had been sent into pharmacy.

## 2022-06-27 ENCOUNTER — Other Ambulatory Visit (HOSPITAL_COMMUNITY): Payer: Self-pay

## 2022-06-28 ENCOUNTER — Inpatient Hospital Stay: Payer: Medicare Other | Attending: Hematology

## 2022-06-28 ENCOUNTER — Inpatient Hospital Stay: Payer: Medicare Other

## 2022-06-28 VITALS — BP 108/49 | HR 50 | Temp 96.7°F | Resp 18

## 2022-06-28 VITALS — BP 78/50 | HR 48 | Temp 97.5°F | Resp 17

## 2022-06-28 DIAGNOSIS — D471 Chronic myeloproliferative disease: Secondary | ICD-10-CM | POA: Diagnosis not present

## 2022-06-28 DIAGNOSIS — R300 Dysuria: Secondary | ICD-10-CM

## 2022-06-28 DIAGNOSIS — Z95828 Presence of other vascular implants and grafts: Secondary | ICD-10-CM

## 2022-06-28 DIAGNOSIS — D649 Anemia, unspecified: Secondary | ICD-10-CM

## 2022-06-28 DIAGNOSIS — R161 Splenomegaly, not elsewhere classified: Secondary | ICD-10-CM

## 2022-06-28 DIAGNOSIS — I959 Hypotension, unspecified: Secondary | ICD-10-CM

## 2022-06-28 DIAGNOSIS — H532 Diplopia: Secondary | ICD-10-CM | POA: Diagnosis not present

## 2022-06-28 LAB — CBC WITH DIFFERENTIAL/PLATELET
Abs Immature Granulocytes: 6.3 10*3/uL — ABNORMAL HIGH (ref 0.00–0.07)
Band Neutrophils: 11 %
Basophils Absolute: 0 10*3/uL (ref 0.0–0.1)
Basophils Relative: 0 %
Eosinophils Absolute: 0.5 10*3/uL (ref 0.0–0.5)
Eosinophils Relative: 1 %
HCT: 25.7 % — ABNORMAL LOW (ref 36.0–46.0)
Hemoglobin: 8 g/dL — ABNORMAL LOW (ref 12.0–15.0)
Lymphocytes Relative: 6 %
Lymphs Abs: 2.9 10*3/uL (ref 0.7–4.0)
MCH: 30.2 pg (ref 26.0–34.0)
MCHC: 31.1 g/dL (ref 30.0–36.0)
MCV: 97 fL (ref 80.0–100.0)
Metamyelocytes Relative: 8 %
Monocytes Absolute: 4.4 10*3/uL — ABNORMAL HIGH (ref 0.1–1.0)
Monocytes Relative: 9 %
Myelocytes: 4 %
Neutro Abs: 34.4 10*3/uL — ABNORMAL HIGH (ref 1.7–7.7)
Neutrophils Relative %: 60 %
Platelets: 150 10*3/uL (ref 150–400)
Promyelocytes Relative: 1 %
RBC: 2.65 MIL/uL — ABNORMAL LOW (ref 3.87–5.11)
RDW: 21 % — ABNORMAL HIGH (ref 11.5–15.5)
WBC: 48.5 10*3/uL — ABNORMAL HIGH (ref 4.0–10.5)
nRBC: 4 /100 WBC — ABNORMAL HIGH
nRBC: 4.4 % — ABNORMAL HIGH (ref 0.0–0.2)

## 2022-06-28 LAB — SAMPLE TO BLOOD BANK

## 2022-06-28 MED ORDER — SODIUM CHLORIDE 0.9% FLUSH
10.0000 mL | Freq: Once | INTRAVENOUS | Status: AC
  Administered 2022-06-28: 10 mL via INTRAVENOUS

## 2022-06-28 MED ORDER — HEPARIN SOD (PORK) LOCK FLUSH 100 UNIT/ML IV SOLN: 500.0000 [IU] | Freq: Once | INTRAVENOUS | Status: DC

## 2022-06-28 MED ORDER — SODIUM CHLORIDE 0.9 % IV SOLN: INTRAVENOUS | Status: DC

## 2022-06-28 NOTE — Progress Notes (Signed)
Patient presents today for IVF.  Patient is complaining of dizziness and weakness.  Patient is hypotensive today.  All other vitals are stable.  Hemoglobin today is 8.0.  We will proceed with IVF per MD orders.     Patient will RTC next week for one unit of PRBC per Dr. Ellin Saba.    Patient tolerated IVF well with no complaints voiced.  Patient left via wheelchair with husband in stable condition.  Vital signs stable at discharge.  Follow up as scheduled.

## 2022-06-28 NOTE — Progress Notes (Signed)
Port flushed with good blood return noted. No bruising or swelling at site. Additional IVF added d/t blood pressure.

## 2022-06-28 NOTE — Patient Instructions (Signed)
[facility]  Discharge Instructions: Thank you for choosing [facility] to provide your oncology and hematology care.  If you have a lab appointment with the Cancer Center - please note that after April 8th, 2024, all labs will be drawn in the cancer center.  You do not have to check in or register with the main entrance as you have in the past but will complete your check-in in the cancer center.  Wear comfortable clothing and clothing appropriate for easy access to any Portacath or PICC line.   We strive to give you quality time with your provider. You may need to reschedule your appointment if you arrive late (15 or more minutes).  Arriving late affects you and other patients whose appointments are after yours.  Also, if you miss three or more appointments without notifying the office, you may be dismissed from the clinic at the provider's discretion.      For prescription refill requests, have your pharmacy contact our office and allow 72 hours for refills to be completed.  To help prevent nausea and vomiting after your treatment, we encourage you to take your nausea medication as directed.  BELOW ARE SYMPTOMS THAT SHOULD BE REPORTED IMMEDIATELY: *FEVER GREATER THAN 100.4 F (38 C) OR HIGHER *CHILLS OR SWEATING *NAUSEA AND VOMITING THAT IS NOT CONTROLLED WITH YOUR NAUSEA MEDICATION *UNUSUAL SHORTNESS OF BREATH *UNUSUAL BRUISING OR BLEEDING *URINARY PROBLEMS (pain or burning when urinating, or frequent urination) *BOWEL PROBLEMS (unusual diarrhea, constipation, pain near the anus) TENDERNESS IN MOUTH AND THROAT WITH OR WITHOUT PRESENCE OF ULCERS (sore throat, sores in mouth, or a toothache) UNUSUAL RASH, SWELLING OR PAIN  UNUSUAL VAGINAL DISCHARGE OR ITCHING   Items with * indicate a potential emergency and should be followed up as soon as possible or go to the Emergency Department if any problems should occur.  Please show the CHEMOTHERAPY ALERT CARD or  IMMUNOTHERAPY ALERT CARD at check-in to the Emergency Department and triage nurse.  Should you have questions after your visit or need to cancel or reschedule your appointment, please contact [facility] [phone]  and follow the prompts.  Office hours are 8:00 a.m. to 4:30 p.m. Monday - Friday. Please note that voicemails left after 4:00 p.m. may not be returned until the following business day.  We are closed weekends and major holidays. You have access to a nurse at all times for urgent questions. Please call the main number to the clinic [phone] and follow the prompts.  For any non-urgent questions, you may also contact your provider using MyChart. We now offer e-Visits for anyone 18 and older to request care online for non-urgent symptoms. For details visit [email or web address].   Also download the MyChart app! Go to the app store, search "MyChart", open the app, select [facility], and log in with your MyChart username and password.   

## 2022-06-30 ENCOUNTER — Ambulatory Visit (HOSPITAL_COMMUNITY)
Admission: RE | Admit: 2022-06-30 | Discharge: 2022-06-30 | Disposition: A | Discharge status: Home / Self Care | Payer: Medicare Other | Source: Ambulatory Visit | Attending: Family Medicine | Admitting: Family Medicine

## 2022-06-30 DIAGNOSIS — R079 Chest pain, unspecified: Secondary | ICD-10-CM | POA: Diagnosis not present

## 2022-06-30 DIAGNOSIS — R0789 Other chest pain: Secondary | ICD-10-CM | POA: Diagnosis not present

## 2022-06-30 DIAGNOSIS — J439 Emphysema, unspecified: Secondary | ICD-10-CM | POA: Diagnosis not present

## 2022-06-30 DIAGNOSIS — I700 Atherosclerosis of aorta: Secondary | ICD-10-CM | POA: Diagnosis not present

## 2022-07-01 DIAGNOSIS — M329 Systemic lupus erythematosus, unspecified: Secondary | ICD-10-CM | POA: Diagnosis not present

## 2022-07-01 DIAGNOSIS — I129 Hypertensive chronic kidney disease with stage 1 through stage 4 chronic kidney disease, or unspecified chronic kidney disease: Secondary | ICD-10-CM | POA: Diagnosis not present

## 2022-07-01 DIAGNOSIS — G709 Myoneural disorder, unspecified: Secondary | ICD-10-CM | POA: Diagnosis not present

## 2022-07-01 DIAGNOSIS — N183 Chronic kidney disease, stage 3 unspecified: Secondary | ICD-10-CM | POA: Diagnosis not present

## 2022-07-01 DIAGNOSIS — D631 Anemia in chronic kidney disease: Secondary | ICD-10-CM | POA: Diagnosis not present

## 2022-07-01 DIAGNOSIS — C946 Myelodysplastic disease, not classified: Secondary | ICD-10-CM | POA: Diagnosis not present

## 2022-07-05 ENCOUNTER — Other Ambulatory Visit: Payer: Self-pay

## 2022-07-05 DIAGNOSIS — M329 Systemic lupus erythematosus, unspecified: Secondary | ICD-10-CM | POA: Diagnosis not present

## 2022-07-05 DIAGNOSIS — G709 Myoneural disorder, unspecified: Secondary | ICD-10-CM | POA: Diagnosis not present

## 2022-07-05 DIAGNOSIS — C946 Myelodysplastic disease, not classified: Secondary | ICD-10-CM | POA: Diagnosis not present

## 2022-07-05 DIAGNOSIS — I129 Hypertensive chronic kidney disease with stage 1 through stage 4 chronic kidney disease, or unspecified chronic kidney disease: Secondary | ICD-10-CM | POA: Diagnosis not present

## 2022-07-05 DIAGNOSIS — D649 Anemia, unspecified: Secondary | ICD-10-CM

## 2022-07-05 DIAGNOSIS — D631 Anemia in chronic kidney disease: Secondary | ICD-10-CM | POA: Diagnosis not present

## 2022-07-05 DIAGNOSIS — N183 Chronic kidney disease, stage 3 unspecified: Secondary | ICD-10-CM | POA: Diagnosis not present

## 2022-07-06 ENCOUNTER — Inpatient Hospital Stay: Payer: Medicare Other

## 2022-07-06 ENCOUNTER — Telehealth: Payer: Self-pay

## 2022-07-06 DIAGNOSIS — D649 Anemia, unspecified: Secondary | ICD-10-CM

## 2022-07-06 DIAGNOSIS — D471 Chronic myeloproliferative disease: Secondary | ICD-10-CM | POA: Diagnosis not present

## 2022-07-06 LAB — CBC WITH DIFFERENTIAL/PLATELET
Abs Immature Granulocytes: 11.8 10*3/uL — ABNORMAL HIGH (ref 0.00–0.07)
Band Neutrophils: 9 %
Basophils Absolute: 0 10*3/uL (ref 0.0–0.1)
Basophils Relative: 0 %
Eosinophils Absolute: 0 10*3/uL (ref 0.0–0.5)
Eosinophils Relative: 0 %
HCT: 28.1 % — ABNORMAL LOW (ref 36.0–46.0)
Hemoglobin: 8.7 g/dL — ABNORMAL LOW (ref 12.0–15.0)
Lymphocytes Relative: 4 %
Lymphs Abs: 2.2 10*3/uL (ref 0.7–4.0)
MCH: 30.3 pg (ref 26.0–34.0)
MCHC: 31 g/dL (ref 30.0–36.0)
MCV: 97.9 fL (ref 80.0–100.0)
Metamyelocytes Relative: 13 %
Monocytes Absolute: 3.4 10*3/uL — ABNORMAL HIGH (ref 0.1–1.0)
Monocytes Relative: 6 %
Myelocytes: 8 %
Neutro Abs: 38.6 10*3/uL — ABNORMAL HIGH (ref 1.7–7.7)
Neutrophils Relative %: 60 %
Platelets: 198 10*3/uL (ref 150–400)
RBC: 2.87 MIL/uL — ABNORMAL LOW (ref 3.87–5.11)
RDW: 21.3 % — ABNORMAL HIGH (ref 11.5–15.5)
WBC: 56 10*3/uL (ref 4.0–10.5)
nRBC: 11 /100 WBC — ABNORMAL HIGH
nRBC: 5.7 % — ABNORMAL HIGH (ref 0.0–0.2)

## 2022-07-06 LAB — SAMPLE TO BLOOD BANK

## 2022-07-06 MED ORDER — HEPARIN SOD (PORK) LOCK FLUSH 100 UNIT/ML IV SOLN
500.0000 [IU] | Freq: Once | INTRAVENOUS | Status: AC
  Administered 2022-07-06: 500 [IU] via INTRAVENOUS

## 2022-07-06 MED ORDER — SODIUM CHLORIDE 0.9% FLUSH
10.0000 mL | INTRAVENOUS | Status: DC | PRN
  Administered 2022-07-06: 10 mL via INTRAVENOUS

## 2022-07-06 NOTE — Telephone Encounter (Signed)
Left message for patient that she does not need blood this week. Her HGB is 8.7

## 2022-07-06 NOTE — Progress Notes (Signed)
Renee Waters presented for Portacath access and flush and labs.  Portacath located right chest wall accessed with  H 20 needle.  Good blood return present. Portacath flushed with 20ml NS and 500U/40ml Heparin and needle removed intact. No bruising or swelling noted at the site. Procedure tolerated well and without incident.     Discharged from clinic ambulatory with cane in stable condition. Alert and oriented x 3. F/U with [facility] as scheduled.

## 2022-07-08 ENCOUNTER — Inpatient Hospital Stay: Payer: Medicare Other

## 2022-07-11 ENCOUNTER — Other Ambulatory Visit: Payer: Self-pay

## 2022-07-11 ENCOUNTER — Other Ambulatory Visit: Payer: Self-pay | Admitting: Internal Medicine

## 2022-07-11 ENCOUNTER — Other Ambulatory Visit: Payer: Self-pay | Admitting: Family Medicine

## 2022-07-11 ENCOUNTER — Other Ambulatory Visit (HOSPITAL_COMMUNITY): Payer: Self-pay

## 2022-07-11 DIAGNOSIS — D471 Chronic myeloproliferative disease: Secondary | ICD-10-CM

## 2022-07-11 MED ORDER — PREGABALIN 75 MG PO CAPS: 75.0000 mg | ORAL_CAPSULE | Freq: Two times a day (BID) | ORAL | 0 refills | Status: DC

## 2022-07-11 MED ORDER — HYDROXYZINE HCL 50 MG PO TABS: 50.0000 mg | ORAL_TABLET | Freq: Three times a day (TID) | ORAL | 1 refills | Status: DC | PRN

## 2022-07-11 MED ORDER — MOMELOTINIB DIHYDROCHLORIDE 150 MG PO TABS
150.0000 mg | ORAL_TABLET | Freq: Every day | ORAL | 1 refills | Status: DC
Start: 2022-07-11 — End: 2022-08-22
  Filled 2022-07-11 – 2022-07-15 (×2): qty 30, 30d supply, fill #0
  Filled 2022-08-05: qty 30, 30d supply, fill #1

## 2022-07-13 DIAGNOSIS — N183 Chronic kidney disease, stage 3 unspecified: Secondary | ICD-10-CM | POA: Diagnosis not present

## 2022-07-13 DIAGNOSIS — M329 Systemic lupus erythematosus, unspecified: Secondary | ICD-10-CM | POA: Diagnosis not present

## 2022-07-13 DIAGNOSIS — I129 Hypertensive chronic kidney disease with stage 1 through stage 4 chronic kidney disease, or unspecified chronic kidney disease: Secondary | ICD-10-CM | POA: Diagnosis not present

## 2022-07-13 DIAGNOSIS — G709 Myoneural disorder, unspecified: Secondary | ICD-10-CM | POA: Diagnosis not present

## 2022-07-13 DIAGNOSIS — D631 Anemia in chronic kidney disease: Secondary | ICD-10-CM | POA: Diagnosis not present

## 2022-07-13 DIAGNOSIS — C946 Myelodysplastic disease, not classified: Secondary | ICD-10-CM | POA: Diagnosis not present

## 2022-07-14 DIAGNOSIS — M519 Unspecified thoracic, thoracolumbar and lumbosacral intervertebral disc disorder: Secondary | ICD-10-CM | POA: Diagnosis not present

## 2022-07-14 DIAGNOSIS — G8929 Other chronic pain: Secondary | ICD-10-CM | POA: Diagnosis not present

## 2022-07-14 DIAGNOSIS — G4700 Insomnia, unspecified: Secondary | ICD-10-CM | POA: Diagnosis not present

## 2022-07-14 DIAGNOSIS — K219 Gastro-esophageal reflux disease without esophagitis: Secondary | ICD-10-CM | POA: Diagnosis not present

## 2022-07-14 DIAGNOSIS — C946 Myelodysplastic disease, not classified: Secondary | ICD-10-CM | POA: Diagnosis not present

## 2022-07-14 DIAGNOSIS — Z87891 Personal history of nicotine dependence: Secondary | ICD-10-CM | POA: Diagnosis not present

## 2022-07-14 DIAGNOSIS — K2270 Barrett's esophagus without dysplasia: Secondary | ICD-10-CM | POA: Diagnosis not present

## 2022-07-14 DIAGNOSIS — M549 Dorsalgia, unspecified: Secondary | ICD-10-CM | POA: Diagnosis not present

## 2022-07-14 DIAGNOSIS — M542 Cervicalgia: Secondary | ICD-10-CM | POA: Diagnosis not present

## 2022-07-14 DIAGNOSIS — M1990 Unspecified osteoarthritis, unspecified site: Secondary | ICD-10-CM | POA: Diagnosis not present

## 2022-07-14 DIAGNOSIS — D696 Thrombocytopenia, unspecified: Secondary | ICD-10-CM | POA: Diagnosis not present

## 2022-07-14 DIAGNOSIS — F419 Anxiety disorder, unspecified: Secondary | ICD-10-CM | POA: Diagnosis not present

## 2022-07-14 DIAGNOSIS — F32A Depression, unspecified: Secondary | ICD-10-CM | POA: Diagnosis not present

## 2022-07-14 DIAGNOSIS — I129 Hypertensive chronic kidney disease with stage 1 through stage 4 chronic kidney disease, or unspecified chronic kidney disease: Secondary | ICD-10-CM | POA: Diagnosis not present

## 2022-07-14 DIAGNOSIS — Z87440 Personal history of urinary (tract) infections: Secondary | ICD-10-CM | POA: Diagnosis not present

## 2022-07-14 DIAGNOSIS — G709 Myoneural disorder, unspecified: Secondary | ICD-10-CM | POA: Diagnosis not present

## 2022-07-14 DIAGNOSIS — D631 Anemia in chronic kidney disease: Secondary | ICD-10-CM | POA: Diagnosis not present

## 2022-07-14 DIAGNOSIS — M329 Systemic lupus erythematosus, unspecified: Secondary | ICD-10-CM | POA: Diagnosis not present

## 2022-07-14 DIAGNOSIS — N183 Chronic kidney disease, stage 3 unspecified: Secondary | ICD-10-CM | POA: Diagnosis not present

## 2022-07-15 ENCOUNTER — Other Ambulatory Visit: Payer: Self-pay

## 2022-07-15 ENCOUNTER — Other Ambulatory Visit (HOSPITAL_COMMUNITY): Payer: Self-pay

## 2022-07-18 DIAGNOSIS — D47Z9 Other specified neoplasms of uncertain behavior of lymphoid, hematopoietic and related tissue: Secondary | ICD-10-CM | POA: Diagnosis not present

## 2022-07-18 DIAGNOSIS — Z515 Encounter for palliative care: Secondary | ICD-10-CM | POA: Diagnosis not present

## 2022-07-19 ENCOUNTER — Inpatient Hospital Stay: Payer: Medicare Other | Attending: Hematology | Admitting: Hematology

## 2022-07-19 ENCOUNTER — Inpatient Hospital Stay: Payer: Medicare Other

## 2022-07-19 ENCOUNTER — Other Ambulatory Visit: Payer: Self-pay | Admitting: Internal Medicine

## 2022-07-19 ENCOUNTER — Encounter: Payer: Self-pay | Admitting: Hematology

## 2022-07-19 VITALS — BP 166/78 | HR 56 | Temp 98.0°F | Resp 18 | Wt 116.0 lb

## 2022-07-19 DIAGNOSIS — N1832 Chronic kidney disease, stage 3b: Secondary | ICD-10-CM | POA: Diagnosis present

## 2022-07-19 DIAGNOSIS — D630 Anemia in neoplastic disease: Secondary | ICD-10-CM | POA: Diagnosis present

## 2022-07-19 DIAGNOSIS — M545 Low back pain, unspecified: Secondary | ICD-10-CM | POA: Diagnosis present

## 2022-07-19 DIAGNOSIS — W19XXXA Unspecified fall, initial encounter: Secondary | ICD-10-CM | POA: Diagnosis present

## 2022-07-19 DIAGNOSIS — S0990XA Unspecified injury of head, initial encounter: Secondary | ICD-10-CM | POA: Diagnosis not present

## 2022-07-19 DIAGNOSIS — E876 Hypokalemia: Secondary | ICD-10-CM | POA: Diagnosis present

## 2022-07-19 DIAGNOSIS — I129 Hypertensive chronic kidney disease with stage 1 through stage 4 chronic kidney disease, or unspecified chronic kidney disease: Secondary | ICD-10-CM | POA: Diagnosis present

## 2022-07-19 DIAGNOSIS — E878 Other disorders of electrolyte and fluid balance, not elsewhere classified: Secondary | ICD-10-CM | POA: Diagnosis not present

## 2022-07-19 DIAGNOSIS — Z043 Encounter for examination and observation following other accident: Secondary | ICD-10-CM | POA: Diagnosis not present

## 2022-07-19 DIAGNOSIS — Z79899 Other long term (current) drug therapy: Secondary | ICD-10-CM

## 2022-07-19 DIAGNOSIS — R32 Unspecified urinary incontinence: Secondary | ICD-10-CM | POA: Diagnosis present

## 2022-07-19 DIAGNOSIS — D471 Chronic myeloproliferative disease: Secondary | ICD-10-CM

## 2022-07-19 DIAGNOSIS — Z8249 Family history of ischemic heart disease and other diseases of the circulatory system: Secondary | ICD-10-CM

## 2022-07-19 DIAGNOSIS — T50995A Adverse effect of other drugs, medicaments and biological substances, initial encounter: Secondary | ICD-10-CM | POA: Diagnosis present

## 2022-07-19 DIAGNOSIS — Z9049 Acquired absence of other specified parts of digestive tract: Secondary | ICD-10-CM

## 2022-07-19 DIAGNOSIS — Z96641 Presence of right artificial hip joint: Secondary | ICD-10-CM | POA: Diagnosis present

## 2022-07-19 DIAGNOSIS — Z87891 Personal history of nicotine dependence: Secondary | ICD-10-CM

## 2022-07-19 DIAGNOSIS — Z90710 Acquired absence of both cervix and uterus: Secondary | ICD-10-CM

## 2022-07-19 DIAGNOSIS — R9431 Abnormal electrocardiogram [ECG] [EKG]: Secondary | ICD-10-CM | POA: Diagnosis not present

## 2022-07-19 DIAGNOSIS — Y92009 Unspecified place in unspecified non-institutional (private) residence as the place of occurrence of the external cause: Secondary | ICD-10-CM

## 2022-07-19 DIAGNOSIS — Z1152 Encounter for screening for COVID-19: Secondary | ICD-10-CM

## 2022-07-19 DIAGNOSIS — Z823 Family history of stroke: Secondary | ICD-10-CM

## 2022-07-19 DIAGNOSIS — R531 Weakness: Secondary | ICD-10-CM | POA: Diagnosis present

## 2022-07-19 DIAGNOSIS — Z885 Allergy status to narcotic agent status: Secondary | ICD-10-CM

## 2022-07-19 DIAGNOSIS — F1120 Opioid dependence, uncomplicated: Secondary | ICD-10-CM | POA: Diagnosis present

## 2022-07-19 DIAGNOSIS — Z87440 Personal history of urinary (tract) infections: Secondary | ICD-10-CM

## 2022-07-19 DIAGNOSIS — D649 Anemia, unspecified: Secondary | ICD-10-CM

## 2022-07-19 DIAGNOSIS — R6510 Systemic inflammatory response syndrome (SIRS) of non-infectious origin without acute organ dysfunction: Secondary | ICD-10-CM | POA: Diagnosis not present

## 2022-07-19 DIAGNOSIS — D84821 Immunodeficiency due to drugs: Secondary | ICD-10-CM | POA: Diagnosis present

## 2022-07-19 DIAGNOSIS — G8929 Other chronic pain: Secondary | ICD-10-CM | POA: Diagnosis present

## 2022-07-19 DIAGNOSIS — M329 Systemic lupus erythematosus, unspecified: Secondary | ICD-10-CM | POA: Diagnosis present

## 2022-07-19 DIAGNOSIS — G4700 Insomnia, unspecified: Secondary | ICD-10-CM | POA: Diagnosis present

## 2022-07-19 DIAGNOSIS — L299 Pruritus, unspecified: Secondary | ICD-10-CM | POA: Diagnosis present

## 2022-07-19 DIAGNOSIS — M25561 Pain in right knee: Secondary | ICD-10-CM | POA: Diagnosis not present

## 2022-07-19 DIAGNOSIS — T451X5A Adverse effect of antineoplastic and immunosuppressive drugs, initial encounter: Secondary | ICD-10-CM | POA: Diagnosis present

## 2022-07-19 DIAGNOSIS — Z888 Allergy status to other drugs, medicaments and biological substances status: Secondary | ICD-10-CM

## 2022-07-19 DIAGNOSIS — S199XXA Unspecified injury of neck, initial encounter: Secondary | ICD-10-CM | POA: Diagnosis not present

## 2022-07-19 DIAGNOSIS — R161 Splenomegaly, not elsewhere classified: Secondary | ICD-10-CM

## 2022-07-19 DIAGNOSIS — H538 Other visual disturbances: Secondary | ICD-10-CM | POA: Diagnosis present

## 2022-07-19 DIAGNOSIS — Z91030 Bee allergy status: Secondary | ICD-10-CM

## 2022-07-19 DIAGNOSIS — E8809 Other disorders of plasma-protein metabolism, not elsewhere classified: Secondary | ICD-10-CM | POA: Diagnosis present

## 2022-07-19 DIAGNOSIS — F32A Depression, unspecified: Secondary | ICD-10-CM | POA: Diagnosis present

## 2022-07-19 DIAGNOSIS — R296 Repeated falls: Secondary | ICD-10-CM | POA: Diagnosis present

## 2022-07-19 LAB — CBC WITH DIFFERENTIAL/PLATELET
Abs Immature Granulocytes: 5.7 10*3/uL — ABNORMAL HIGH (ref 0.00–0.07)
Band Neutrophils: 11 %
Basophils Absolute: 0 10*3/uL (ref 0.0–0.1)
Basophils Relative: 0 %
Eosinophils Absolute: 0 10*3/uL (ref 0.0–0.5)
Eosinophils Relative: 0 %
HCT: 25.7 % — ABNORMAL LOW (ref 36.0–46.0)
Hemoglobin: 7.9 g/dL — ABNORMAL LOW (ref 12.0–15.0)
Lymphocytes Relative: 14 %
Lymphs Abs: 5.3 10*3/uL — ABNORMAL HIGH (ref 0.7–4.0)
MCH: 30.2 pg (ref 26.0–34.0)
MCHC: 30.7 g/dL (ref 30.0–36.0)
MCV: 98.1 fL (ref 80.0–100.0)
Metamyelocytes Relative: 9 %
Monocytes Absolute: 4.6 10*3/uL — ABNORMAL HIGH (ref 0.1–1.0)
Monocytes Relative: 12 %
Myelocytes: 6 %
Neutro Abs: 22.5 10*3/uL — ABNORMAL HIGH (ref 1.7–7.7)
Neutrophils Relative %: 48 %
Platelets: 130 10*3/uL — ABNORMAL LOW (ref 150–400)
RBC: 2.62 MIL/uL — ABNORMAL LOW (ref 3.87–5.11)
RDW: 21.2 % — ABNORMAL HIGH (ref 11.5–15.5)
WBC: 38.2 10*3/uL — ABNORMAL HIGH (ref 4.0–10.5)
nRBC: 6 /100 WBC — ABNORMAL HIGH
nRBC: 8.1 % — ABNORMAL HIGH (ref 0.0–0.2)

## 2022-07-19 LAB — LACTATE DEHYDROGENASE: LDH: 588 U/L — ABNORMAL HIGH (ref 98–192)

## 2022-07-19 LAB — COMPREHENSIVE METABOLIC PANEL
ALT: 17 U/L (ref 0–44)
AST: 27 U/L (ref 15–41)
Albumin: 3.6 g/dL (ref 3.5–5.0)
Alkaline Phosphatase: 128 U/L — ABNORMAL HIGH (ref 38–126)
Anion gap: 8 (ref 5–15)
BUN: 39 mg/dL — ABNORMAL HIGH (ref 8–23)
CO2: 21 mmol/L — ABNORMAL LOW (ref 22–32)
Calcium: 8.5 mg/dL — ABNORMAL LOW (ref 8.9–10.3)
Chloride: 108 mmol/L (ref 98–111)
Creatinine, Ser: 1.73 mg/dL — ABNORMAL HIGH (ref 0.44–1.00)
GFR, Estimated: 30 mL/min — ABNORMAL LOW (ref >60)
Glucose, Bld: 100 mg/dL — ABNORMAL HIGH (ref 70–99)
Potassium: 4.6 mmol/L (ref 3.5–5.1)
Sodium: 137 mmol/L (ref 135–145)
Total Bilirubin: 0.6 mg/dL (ref 0.3–1.2)
Total Protein: 7.7 g/dL (ref 6.5–8.1)

## 2022-07-19 LAB — SAMPLE TO BLOOD BANK

## 2022-07-19 LAB — MAGNESIUM: Magnesium: 2 mg/dL (ref 1.7–2.4)

## 2022-07-19 MED ORDER — HEPARIN SOD (PORK) LOCK FLUSH 100 UNIT/ML IV SOLN
500.0000 [IU] | Freq: Once | INTRAVENOUS | Status: AC
  Administered 2022-07-19: 500 [IU] via INTRAVENOUS

## 2022-07-19 MED ORDER — SODIUM CHLORIDE 0.9% FLUSH
10.0000 mL | Freq: Once | INTRAVENOUS | Status: AC
  Administered 2022-07-19: 10 mL via INTRAVENOUS

## 2022-07-19 NOTE — Progress Notes (Signed)
Patient is taking Ojjaara as prescribed.  She has not missed any doses and reports no side effects at this time.

## 2022-07-19 NOTE — Patient Instructions (Signed)
[facility]  Discharge Instructions: Thank you for choosing [facility] to provide your oncology and hematology care.  If you have a lab appointment with the Cancer Center - please note that after April 8th, 2024, all labs will be drawn in the cancer center.  You do not have to check in or register with the main entrance as you have in the past but will complete your check-in in the cancer center.  Wear comfortable clothing and clothing appropriate for easy access to any Portacath or PICC line.   We strive to give you quality time with your provider. You may need to reschedule your appointment if you arrive late (15 or more minutes).  Arriving late affects you and other patients whose appointments are after yours.  Also, if you miss three or more appointments without notifying the office, you may be dismissed from the clinic at the provider's discretion.      For prescription refill requests, have your pharmacy contact our office and allow 72 hours for refills to be completed.  To help prevent nausea and vomiting after your treatment, we encourage you to take your nausea medication as directed.  BELOW ARE SYMPTOMS THAT SHOULD BE REPORTED IMMEDIATELY: *FEVER GREATER THAN 100.4 F (38 C) OR HIGHER *CHILLS OR SWEATING *NAUSEA AND VOMITING THAT IS NOT CONTROLLED WITH YOUR NAUSEA MEDICATION *UNUSUAL SHORTNESS OF BREATH *UNUSUAL BRUISING OR BLEEDING *URINARY PROBLEMS (pain or burning when urinating, or frequent urination) *BOWEL PROBLEMS (unusual diarrhea, constipation, pain near the anus) TENDERNESS IN MOUTH AND THROAT WITH OR WITHOUT PRESENCE OF ULCERS (sore throat, sores in mouth, or a toothache) UNUSUAL RASH, SWELLING OR PAIN  UNUSUAL VAGINAL DISCHARGE OR ITCHING   Items with * indicate a potential emergency and should be followed up as soon as possible or go to the Emergency Department if any problems should occur.  Please show the CHEMOTHERAPY ALERT CARD or  IMMUNOTHERAPY ALERT CARD at check-in to the Emergency Department and triage nurse.  Should you have questions after your visit or need to cancel or reschedule your appointment, please contact [facility] [phone]  and follow the prompts.  Office hours are 8:00 a.m. to 4:30 p.m. Monday - Friday. Please note that voicemails left after 4:00 p.m. may not be returned until the following business day.  We are closed weekends and major holidays. You have access to a nurse at all times for urgent questions. Please call the main number to the clinic [phone] and follow the prompts.  For any non-urgent questions, you may also contact your provider using MyChart. We now offer e-Visits for anyone 18 and older to request care online for non-urgent symptoms. For details visit [email or web address].   Also download the MyChart app! Go to the app store, search "MyChart", open the app, select [place] [facility], and log in with your MyChart username and password.   

## 2022-07-19 NOTE — Progress Notes (Signed)
[facility] [address] [place] [address]    Clinic Day:  07/19/2022  Referring physician: Kerri Perches, MD  Patient Care Team: Kerri Perches, MD as PCP - General Branch, Alben Spittle, MD as PCP - Cardiology (Cardiology) Jena Gauss Gerrit Friends, MD (Gastroenterology) Vickki Hearing, MD as Consulting Physician (Orthopedic Surgery) [name], MD as Medical Oncologist (Oncology) Therese Sarah, RN as Oncology Nurse Navigator (Oncology) Clinton Gallant, RN as [facility] Care Management   ASSESSMENT & PLAN:   Assessment: 1. MPL positive prefibrotic/early primary myelofibrosis: - CBC on 11/06/2020 with white count 75.6, differential 59% neutrophils, 12% monocytes, 4% lymphocytes, 1 percentage of eosinophils and basophils, 6% band neutrophils, 12% myelocytes, 1% promyelocytes, 29% blasts. - Pathologist review of blood smear reported as leukoerythroblastic reaction. - 25 pound weight loss in the last 6 months, unintentional.  Decreased appetite.  Reports fatigue for the last few months.  Reports night sweats x3 in the last 1 month. - Bone marrow biopsy on 11/18/2020 consistent with granulocytic proliferation with differential including CMML versus myeloproliferative disorder. - BCR/ABL was negative. 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 - NGS myeloid panel shows mutations in ASXL1, MPL, TET2, EZH2 mutations. - Spleen ultrasound on 12/16/2020 shows mildly enlarged measuring 12.4 x 9.6 x 7 cm with volume of 435 cc. - PDGFR alpha, beta and FGFR 1 was negative. - She was evaluated by Dr. Lowell Guitar at [facility].  Slides were reviewed at [facility] hematopathology.  They thought it was less likely CMML and more likely MPL positive myeloproliferative neoplasm.  Dr. Lowell Guitar has recommended initiate Jakafi. - Given anemia, B symptoms, cytogenetics with MPL, ASLX1, normal karyotype she is intermediate risk on  GIPPS at high risk MIPSS70+ - Ruxolitinib 10 mg twice daily started on 02/16/2021.  Dose increased to 15 mg twice daily on 05/18/2021. - CTAP on 04/07/2021: Hepatomegaly and splenomegaly measuring 14.3 cm.  No other pathology.  No spleen infarcts. -Ruxolitinib dose increased to 15 mg twice daily on 09/20/2021. - Ultrasound spleen (09/21/2021): 13.6 cm with volume 532 mL.  This is slightly improved from previous volume. - Bone marrow biopsy (09/09/2021): Material is limited but overall features consistent with myeloid neoplasm.  No increase in blasts.  Erythroid precursors appear decreased but megakaryocytes are variably evident with clusters and small forms.  Reticulin's shows variable increase in reticulin fibers including areas with prominent increase.  Chromosome analysis was normal. - Tapering doses of ruxolitinib started on 12/24/2021, last dose on 01/23/2022 - Evaluated by Dr. Lowell Guitar on 12/24/2021. - Momelotinib 100 mg daily started on 01/26/2022, dose increased to 150 mg daily on 07/19/2022 due to generalized itching.  2.  Social/family history: - She lives at home and is able to do all her ADLs and IADLs although she is getting tired lately.  She reports quitting smoking 6 months ago and smoked 1 pack/week for 43 years. - She believes that her mother had some kind of leukemia.  No other malignancies.    Plan: MPL positive myeloproliferative neoplasm: - She is taking momelotinib 100 mg daily. - She reported worsening of itching last week.  We have sent a prescription for increased dose of 150 mg daily.  She had just received shipment today. - She reports that she is eating better.  Denies any night sweats. - Reviewed labs today: Normal LFTs.  Creatinine 1.73.  LDH improved 588.  White count improved to 38.  Hemoglobin 7.9  platelet count is 130. - Recommend increasing momelotinib to 150 mg starting today.  RTC 4 weeks for follow-up.  2.  Decreased appetite: - She is eating multiple small meals  per day.  She has good appetite.  She gained 1 pound since last visit.  3.  CKD: - Baseline creatinine 1.3-1.6.  Creatinine today is 1.7.  4.  Hypokalemia: - Continue potassium supplements.  Potassium today is 4.6.   5.  Generalized itching: - Generalized itching has improved since momelotinib started but has worsened in the last 1 week. - Continue Atarax 50 mg 3 times daily and Lyrica 75 mg twice daily. - She complains of feeling dizzy occasionally and needs walker at home. - She will cut back on Lyrica to 75 mg in the mornings as her itching is predominantly during daytime. - Once the itching improves with the increase in dose of momelotinib, we will wean her off of Atarax and Lyrica.  No orders of the defined types were placed in this encounter.     I,Katie Daubenspeck,acting as a Neurosurgeon for [name], MD.,have documented all relevant documentation on the behalf of [name], MD,as directed by  [name], MD while in the presence of [name], MD.   I, [name] MD, have reviewed the above documentation for accuracy and completeness, and I agree with the above.   [name], MD   6/11/20246:38 PM  CHIEF COMPLAINT:   Diagnosis: MPL positive myeloproliferative neoplasm    Cancer Staging  No matching staging information was found for the patient.   Prior Therapy: Jakafi 10 mg twice daily   Current Therapy:  Momelotinib 100 mg daily    HISTORY OF PRESENT ILLNESS:   Oncology History   No history exists.     INTERVAL HISTORY:   Renee Waters is a 77 y.o. female presenting to clinic today for follow up of MPL positive myeloproliferative neoplasm. She was last seen by me on 04/21/22.  Since her last visit, she underwent spleen US on 05/05/22 showing: splenomegaly with length >13 cm and volume >411 cc.  Today, she states that she is doing well overall. Her appetite level is at 100 %. Her energy level is at 100  %.  PAST MEDICAL HISTORY:   Past Medical History: Past Medical History:  Diagnosis Date   Acute cholangitis    Acute respiratory failure with hypoxia (HCC) 09/01/2021   Allergy    Anemia    Anxiety    Arthritis    Phreesia 03/18/2020   Barrett's esophagus    Cataract    Chronic back pain    Chronic neck pain    CKD (chronic kidney disease) stage 3, GFR 30-59 ml/min (HCC) 10/29/2020   Depression    Genital herpes    GERD (gastroesophageal reflux disease)    H/O degenerative disc disease    History of blood transfusion    Hypertension    Hypoxia 12/01/2021   Insomnia    Lupus (systemic lupus erythematosus) (HCC)    Neuromuscular disorder (HCC)    Osteoarthritis    S/P colonoscopy 07/2003   normal, no polyps   S/P endoscopy June 2005, Oct 2009   2005: short-segment Barrett's, 2009: short-segment Barrett's   Upper respiratory tract infection 07/09/2020   UTI (lower urinary tract infection) 11/2012    Surgical History: Past Surgical History:  Procedure Laterality Date   ABDOMINAL HYSTERECTOMY     BACK SURGERY     BIOPSY N/A 03/20/2014   Procedure: BIOPSY;  Surgeon:  Corbin Ade, MD;  Location: [facility];  Service: Endoscopy;  Laterality: N/A;   BIOPSY  09/14/2015   Procedure: BIOPSY;  Surgeon: Corbin Ade, MD;  Location: [facility];  Service: Endoscopy;;  esophageal and gastric   BIOPSY  07/23/2019   Procedure: BIOPSY;  Surgeon: Corbin Ade, MD;  Location: [facility];  Service: Endoscopy;;  esophageal    CARPAL TUNNEL RELEASE Left 2013   cervical disectomy  2002   CESAREAN SECTION N/A    Phreesia 03/18/2020   CHOLECYSTECTOMY     with lysis of adhesions for sbo; "ruptured gallbladder".   COLONOSCOPY  11/09/2011   RMR: Melanosis coli   COLONOSCOPY WITH PROPOFOL N/A 09/14/2015   Dr. Jena Gauss: diverticulosis, 3mm TA removed. next TCS 09/2020.    COLONOSCOPY WITH PROPOFOL N/A 04/30/2020   Procedure: COLONOSCOPY WITH PROPOFOL;  Surgeon: Corbin Ade, MD;  Location:  [facility];  Service: Endoscopy;  Laterality: N/A;  PM (ASA 3)   DENTAL SURGERY  11/2015   multiple tooth extraction   ESOPHAGOGASTRODUODENOSCOPY  11/29/2007   salmon-colored  tongue   longest stable at  3 cm, distal esophagus as described previously status post biopsy/ Hiatal hernia, otherwise normal stomach D1 and D2   ESOPHAGOGASTRODUODENOSCOPY  01/06/11   short segment Barrett's esophagus s/p bx/Hiatal hernia   ESOPHAGOGASTRODUODENOSCOPY (EGD) WITH PROPOFOL N/A 03/20/2014   EAV:[id number] distal esophagus short segment barrett's, bx with no dysplasia. next egd in 03/2017   ESOPHAGOGASTRODUODENOSCOPY (EGD) WITH PROPOFOL N/A 09/14/2015   Dr. Jena Gauss: Barrett's without dysplasia, gastritis benign bx, hiatal hernia. next EGD 09/2018.   ESOPHAGOGASTRODUODENOSCOPY (EGD) WITH PROPOFOL N/A 07/23/2019   Procedure: ESOPHAGOGASTRODUODENOSCOPY (EGD) WITH PROPOFOL;  Surgeon: Corbin Ade, MD;  Location: [facility];  Service: Endoscopy;  Laterality: N/A;  3:00pm   EYE SURGERY N/A    Phreesia 03/18/2020   HERNIA REPAIR Right 07/2010   Dr. Arlean Hopping   IR IMAGING GUIDED PORT INSERTION  02/09/2021   JOINT REPLACEMENT     LAPAROSCOPIC CHOLECYSTECTOMY  2017   at [facility]   POLYPECTOMY  09/14/2015   Procedure: POLYPECTOMY;  Surgeon: Corbin Ade, MD;  Location: [facility];  Service: Endoscopy;;  ascending colon   right hip replacement  07/2010   went back in sept 2012 to fix   SHOULDER ARTHROSCOPY  2008   left   SPINE SURGERY N/A    Phreesia 03/18/2020   TOTAL HIP REVISION Right 12/17/2012   Procedure: RIGHT TOTAL HIP REVISION;  Surgeon: [name], MD;  Location: [facility];  Service: Orthopedics;  Laterality: Right;   WRIST SURGERY Right 2011   open reduction right wrist.    Social History: Social History   Socioeconomic History   Marital status: Married    Spouse name: louis   Number of children: 4   Years of education: 12+   Highest education level: Some college, no degree  Occupational  History   Occupation: Audiological scientist  - retired   Occupation: non profit  Tobacco Use   Smoking status: Former    Packs/day: 0.25    Years: 25.00    Additional pack years: 0.00    Total pack years: 6.25    Types: Cigarettes    Quit date: 02/07/2003    Years since quitting: 19.4   Smokeless tobacco: Never   Tobacco comments:    quit in 2004  Vaping Use   Vaping Use: Never used  Substance and Sexual Activity   Alcohol use: No  Drug use: No   Sexual activity: Not Currently    Birth control/protection: Surgical  Other Topics Concern   Not on file  Social History Narrative   Not on file   Social Determinants of Health   Financial Resource Strain: Low Risk  (12/28/2020)   Overall Financial Resource Strain (CARDIA)    Difficulty of Paying Living Expenses: Not hard at all  Food Insecurity: No Food Insecurity (02/08/2022)   Hunger Vital Sign    Worried About Running Out of Food in the Last Year: Never true    Ran Out of Food in the Last Year: Never true  Transportation Needs: No Transportation Needs (02/11/2022)   PRAPARE - Administrator, Civil Service (Medical): No    Lack of Transportation (Non-Medical): No  Physical Activity: Inactive (04/23/2021)   Exercise Vital Sign    Days of Exercise per Week: 0 days    Minutes of Exercise per Session: 0 min  Stress: Stress Concern Present (04/23/2021)   [employer] of Occupational Health - Occupational Stress Questionnaire    Feeling of Stress : Rather much  Social Connections: Moderately Integrated (12/28/2020)   Social Connection and Isolation Panel [NHANES]    Frequency of Communication with Friends and Family: More than three times a week    Frequency of Social Gatherings with Friends and Family: Once a week    Attends Religious Services: More than 4 times per year    Active Member of [employer] or Organizations: No    Attends Banker Meetings: Never    Marital Status: Married  Catering manager Violence: Not  At Risk (02/08/2022)   Humiliation, Afraid, Rape, and Kick questionnaire    Fear of Current or Ex-Partner: No    Emotionally Abused: No    Physically Abused: No    Sexually Abused: No    Family History: Family History  Problem Relation Age of Onset   Hypertension Mother    Stroke Mother    Colon cancer Neg Hx    Anesthesia problems Neg Hx    Hypotension Neg Hx    Malignant hyperthermia Neg Hx    Pseudochol deficiency Neg Hx    Gastric cancer Neg Hx    Esophageal cancer Neg Hx     Current Medications:  Current Outpatient Medications:    acyclovir (ZOVIRAX) 800 MG tablet, Take 1 tablet (800 mg total) by mouth 5 (five) times daily as needed., Disp: 50 tablet, Rfl: 1   acyclovir ointment (ZOVIRAX) 5 %, Apply 1 Application topically every 3 (three) hours as needed., Disp: 15 g, Rfl: 1   Allopurinol 200 MG TABS, Take 200 mg by mouth daily., Disp: 30 tablet, Rfl: 3   busPIRone (BUSPAR) 5 MG tablet, Take 1 tablet (5 mg total) by mouth daily as needed., Disp: 30 tablet, Rfl: 5   cetirizine (ZYRTEC) 10 MG tablet, Take 1 tablet by mouth once daily, Disp: 30 tablet, Rfl: 0   citalopram (CELEXA) 20 MG tablet, Take 1 tablet by mouth once daily, Disp: 30 tablet, Rfl: 0   EPINEPHrine 0.3 mg/0.3 mL IJ SOAJ injection, INJECT 0.3 MLS INTO MUSCLE ONCE AS NEEDED, Disp: 1 each, Rfl: 2   furosemide (LASIX) 20 MG tablet, TAKE 1 TABLET BY MOUTH ONCE DAILY AS NEEDED, Disp: 30 tablet, Rfl: 3   hydrOXYzine (ATARAX) 50 MG tablet, Take 1 tablet (50 mg total) by mouth 3 (three) times daily as needed., Disp: 90 tablet, Rfl: 1   lidocaine-prilocaine (EMLA) cream, Apply 1 Application  topically as needed (Access to port and pain)., Disp: 30 g, Rfl: 6   metoprolol tartrate (LOPRESSOR) 25 MG tablet, Take one and a half tablets twice daily, Disp: 180 tablet, Rfl: 3   mirtazapine (REMERON) 15 MG tablet, Take 1 tablet (15 mg total) by mouth at bedtime., Disp: 30 tablet, Rfl: 5   momelotinib dihydrochloride (OJJAARA) 150  MG tablet, Take 1 tablet (150 mg total) by mouth daily., Disp: 30 tablet, Rfl: 1   Multiple Vitamin (MULTIVITAMIN WITH MINERALS) TABS tablet, Take 1 tablet by mouth daily., Disp: , Rfl:    ondansetron (ZOFRAN-ODT) 8 MG disintegrating tablet, Take 1 tablet (8 mg total) by mouth every 8 (eight) hours as needed for nausea or vomiting., Disp: 20 tablet, Rfl: 0   oxyCODONE (ROXICODONE) 15 MG immediate release tablet, Take 15 mg by mouth every 6 (six) hours as needed., Disp: , Rfl:    pantoprazole (PROTONIX) 40 MG tablet, TAKE ONE TABLET BY MOUTH 30 TO 60 MINUTES BEFORE YOUR FIRST AND LAST MEALS OF THE DAY, Disp: 60 tablet, Rfl: 0   potassium chloride (KLOR-CON) 10 MEQ tablet, Take 1 tablet by mouth once daily, Disp: 30 tablet, Rfl: 4   pregabalin (LYRICA) 75 MG capsule, Take 1 capsule (75 mg total) by mouth 2 (two) times daily., Disp: 60 capsule, Rfl: 0   prochlorperazine (COMPAZINE) 10 MG tablet, Take 1 tablet (10 mg total) by mouth every 6 (six) hours as needed for nausea or vomiting., Disp: 60 tablet, Rfl: 1   temazepam (RESTORIL) 30 MG capsule, Take 1 capsule (30 mg total) by mouth at bedtime as needed for sleep., Disp: 30 capsule, Rfl: 5   tiZANidine (ZANAFLEX) 4 MG tablet, Take 1 tablet (4 mg total) by mouth every 8 (eight) hours as needed for muscle spasms., Disp: 30 tablet, Rfl: 0 No current facility-administered medications for this visit.  Facility-Administered Medications Ordered in Other Visits:    lanreotide acetate (SOMATULINE DEPOT) 120 MG/0.5ML injection, , , ,    lanreotide acetate (SOMATULINE DEPOT) 120 MG/0.5ML injection, , , ,    lanreotide acetate (SOMATULINE DEPOT) 120 MG/0.5ML injection, , , ,    octreotide (SANDOSTATIN LAR) 30 MG IM injection, , , ,    octreotide (SANDOSTATIN LAR) 30 MG IM injection, , , ,    Allergies: Allergies  Allergen Reactions   Bee Venom Swelling and Hives   Norvasc [Amlodipine] Other (See Comments)    Hair loss   Tylenol [Acetaminophen] Itching    Hydralazine Other (See Comments)    Hair loss   Red Dye Itching   Percocet [Oxycodone-Acetaminophen] Rash and Other (See Comments)    Pt states, "this gives her a rash, but at home she takes oxycodone for pain relief"    Tyloxapol Itching and Rash    REVIEW OF SYSTEMS:   Review of Systems  Constitutional:  Negative for chills, fatigue and fever.  HENT:   Negative for lump/mass, mouth sores, nosebleeds, sore throat and trouble swallowing.   Eyes:  Negative for eye problems.  Cardiovascular:  Negative for chest pain, leg swelling and palpitations.  Gastrointestinal:  Negative for abdominal pain, constipation, diarrhea and vomiting.  Genitourinary:  Negative for bladder incontinence, difficulty urinating, dysuria, frequency, hematuria and nocturia.   Musculoskeletal:  Positive for arthralgias. Negative for back pain, flank pain, myalgias and neck pain.  Skin:  Positive for itching. Negative for rash.  Neurological:  Positive for dizziness. Negative for headaches and numbness.  Hematological:  Does not bruise/bleed easily.  Psychiatric/Behavioral:  Negative for depression, sleep disturbance and suicidal ideas. The patient is not nervous/anxious.   All other systems reviewed and are negative.    VITALS:   Blood pressure (!) 166/78, pulse (!) 56, temperature 98 F (36.7 C), temperature source Oral, resp. rate 18, weight 116 lb (52.6 kg), SpO2 100 %.  Wt Readings from Last 3 Encounters:  07/19/22 116 lb (52.6 kg)  06/07/22 115 lb 9.6 oz (52.4 kg)  04/20/22 118 lb (53.5 kg)    Body mass index is 19.3 kg/m.  Performance status (ECOG): 1 - Symptomatic but completely ambulatory  PHYSICAL EXAM:   Physical Exam Vitals and nursing note reviewed. Exam conducted with a chaperone present.  Constitutional:      Appearance: Normal appearance.  Cardiovascular:     Rate and Rhythm: Normal rate and regular rhythm.     Pulses: Normal pulses.     Heart sounds: Normal heart sounds.   Pulmonary:     Effort: Pulmonary effort is normal.     Breath sounds: Normal breath sounds.  Abdominal:     Palpations: Abdomen is soft. There is no hepatomegaly, splenomegaly or mass.     Tenderness: There is no abdominal tenderness.  Musculoskeletal:     Right lower leg: No edema.     Left lower leg: No edema.  Lymphadenopathy:     Cervical: No cervical adenopathy.     Right cervical: No superficial, deep or posterior cervical adenopathy.    Left cervical: No superficial, deep or posterior cervical adenopathy.     Upper Body:     Right upper body: No supraclavicular or axillary adenopathy.     Left upper body: No supraclavicular or axillary adenopathy.  Neurological:     General: No focal deficit present.     Mental Status: She is alert and oriented to person, place, and time.  Psychiatric:        Mood and Affect: Mood normal.        Behavior: Behavior normal.     LABS:      Latest Ref Rng & Units 07/19/2022    2:19 PM 07/06/2022    1:05 PM 06/28/2022    2:25 PM  CBC  WBC 4.0 - 10.5 K/uL 38.2  56.0  48.5   Hemoglobin 12.0 - 15.0 g/dL 7.9  8.7  8.0   Hematocrit 36.0 - 46.0 % 25.7  28.1  25.7   Platelets 150 - 400 K/uL 130  198  150       Latest Ref Rng & Units 07/19/2022    2:19 PM 06/07/2022    1:12 PM 04/21/2022    1:52 PM  CMP  Glucose 70 - 99 mg/dL 161  89  096   BUN 8 - 23 mg/dL 39  34  38   Creatinine 0.44 - 1.00 mg/dL 0.45  4.09  8.11   Sodium 135 - 145 mmol/L 137  135  133   Potassium 3.5 - 5.1 mmol/L 4.6  4.7  4.2   Chloride 98 - 111 mmol/L 108  107  104   CO2 22 - 32 mmol/L 21  22  20    Calcium 8.9 - 10.3 mg/dL 8.5  8.3  8.5   Total Protein 6.5 - 8.1 g/dL 7.7  7.8  8.2   Total Bilirubin 0.3 - 1.2 mg/dL 0.6  0.8  0.8   Alkaline Phos 38 - 126 U/L 128  175  135   AST 15 - 41 U/L 27  30  34   ALT 0 - 44 U/L 17  24  20       No results found for: "CEA1", "CEA" / No results found for: "CEA1", "CEA" No results found for: "PSA1" No results found for:  "WUJ811" No results found for: "CAN125"  No results found for: "TOTALPROTELP", "ALBUMINELP", "A1GS", "A2GS", "BETS", "BETA2SER", "GAMS", "MSPIKE", "SPEI" Lab Results  Component Value Date   TIBC 329 08/25/2015   TIBC 336 02/21/2011   FERRITIN 26 08/25/2015   FERRITIN 81 02/21/2011   IRONPCTSAT 8 (L) 08/25/2015   IRONPCTSAT 23 02/21/2011   Lab Results  Component Value Date   LDH 588 (H) 07/19/2022   LDH 620 (H) 06/07/2022   LDH 832 (H) 04/21/2022     STUDIES:   CT Chest Wo Contrast  Result Date: 07/06/2022 CLINICAL DATA:  Larey Seat 3 weeks ago, right-sided chest pain, mild cough, history of myeloproliferative disorder EXAM: CT CHEST WITHOUT CONTRAST TECHNIQUE: Multidetector CT imaging of the chest was performed following the standard protocol without IV contrast. RADIATION DOSE REDUCTION: This exam was performed according to the departmental dose-optimization program which includes automated exposure control, adjustment of the mA and/or kV according to patient size and/or use of iterative reconstruction technique. COMPARISON:  02/08/2022, 11/10/2021 FINDINGS: Cardiovascular: Unenhanced imaging of the heart is unremarkable without pericardial effusion. Calcification of the aortic valve. Normal caliber of the thoracic aorta. There is atherosclerosis of the aorta and coronary vasculature. Right chest wall port via internal jugular approach, tip within the superior vena cava. Evaluation of the vascular lumen is limited without IV contrast. Mediastinum/Nodes: No enlarged mediastinal or axillary lymph nodes. Thyroid gland, trachea, and esophagus demonstrate no significant findings. Lungs/Pleura: Stable upper lobe predominant emphysema. No acute airspace disease, effusion, or pneumothorax. Upper Abdomen: Spleen is enlarged. No other acute upper abdominal findings. Musculoskeletal: There are subacute healing right lateral sixth and seventh rib fractures, consistent with history of trauma 3 weeks ago.  Please correlate with site of patient's pain. No other acute bony abnormalities. Multilevel thoracic spondylosis again noted. Reconstructed images demonstrate no additional findings. IMPRESSION: 1. Subacute healing right lateral sixth and seventh rib fractures, consistent with history of trauma 3 weeks ago. Please correlate with site of patient's pain. 2. Otherwise no acute intrathoracic process. 3. Aortic Atherosclerosis (ICD10-I70.0) and Emphysema (ICD10-J43.9). 4. Splenomegaly. Electronically Signed   By: Sharlet Salina M.D.   On: 07/06/2022 13:55

## 2022-07-19 NOTE — Progress Notes (Signed)
Patients port flushed without difficulty.  Good blood return noted with no bruising or swelling noted at site.  Band aid applied.  VSS with discharge and left in satisfactory condition with no s/s of distress noted.   

## 2022-07-19 NOTE — Patient Instructions (Addendum)
[facility] at [facility] Discharge Instructions   You were seen and examined today by Dr. Ellin Saba.  He reviewed the results of your lab work. Your white blood cell count is 38. Your hemoglobin is 7.9 today. Your kidney function is at its baseline.   Start taking the increased dose of Ojjaara.   Cut back on the Lyrica to one pill in the morning.   Return as scheduled.    Thank you for choosing [facility] at [facility] to provide your oncology and hematology care.  To afford each patient quality time with our provider, please arrive at least 15 minutes before your scheduled appointment time.   If you have a lab appointment with the Cancer Center please come in thru the Main Entrance and check in at the main information desk.  You need to re-schedule your appointment should you arrive 10 or more minutes late.  We strive to give you quality time with our providers, and arriving late affects you and other patients whose appointments are after yours.  Also, if you no show three or more times for appointments you may be dismissed from the clinic at the providers discretion.     Again, thank you for choosing [facility].  Our hope is that these requests will decrease the amount of time that you wait before being seen by our physicians.       _____________________________________________________________  Should you have questions after your visit to [facility], please contact our office at [phone] and follow the prompts.  Our office hours are 8:00 a.m. and 4:30 p.m. Monday - Friday.  Please note that voicemails left after 4:00 p.m. may not be returned until the following business day.  We are closed weekends and major holidays.  You do have access to a nurse 24-7, just call the main number to the clinic [phone] and do not press any options, hold on the line and a nurse will answer the phone.    For prescription  refill requests, have your pharmacy contact our office and allow 72 hours.    Due to Covid, you will need to wear a mask upon entering the hospital. If you do not have a mask, a mask will be given to you at the Main Entrance upon arrival. For doctor visits, patients may have 1 support person age 77 or older with them. For treatment visits, patients can not have anyone with them due to social distancing guidelines and our immunocompromised population.

## 2022-07-20 DIAGNOSIS — I129 Hypertensive chronic kidney disease with stage 1 through stage 4 chronic kidney disease, or unspecified chronic kidney disease: Secondary | ICD-10-CM | POA: Diagnosis not present

## 2022-07-20 DIAGNOSIS — G709 Myoneural disorder, unspecified: Secondary | ICD-10-CM | POA: Diagnosis not present

## 2022-07-20 DIAGNOSIS — C946 Myelodysplastic disease, not classified: Secondary | ICD-10-CM | POA: Diagnosis not present

## 2022-07-20 DIAGNOSIS — D631 Anemia in chronic kidney disease: Secondary | ICD-10-CM | POA: Diagnosis not present

## 2022-07-20 DIAGNOSIS — M329 Systemic lupus erythematosus, unspecified: Secondary | ICD-10-CM | POA: Diagnosis not present

## 2022-07-20 DIAGNOSIS — N183 Chronic kidney disease, stage 3 unspecified: Secondary | ICD-10-CM | POA: Diagnosis not present

## 2022-07-21 ENCOUNTER — Telehealth: Payer: Self-pay | Admitting: Family Medicine

## 2022-07-21 ENCOUNTER — Other Ambulatory Visit: Payer: Self-pay

## 2022-07-21 ENCOUNTER — Encounter (HOSPITAL_COMMUNITY): Payer: Self-pay | Admitting: *Deleted

## 2022-07-21 ENCOUNTER — Emergency Department (HOSPITAL_COMMUNITY): Payer: Medicare Other

## 2022-07-21 ENCOUNTER — Inpatient Hospital Stay (HOSPITAL_COMMUNITY)
Admission: EM | Admit: 2022-07-21 | Discharge: 2022-07-23 | DRG: 641 | Disposition: A | Discharge status: Home Health | Payer: Medicare Other | Attending: Internal Medicine | Admitting: Internal Medicine

## 2022-07-21 ENCOUNTER — Telehealth: Payer: Self-pay | Admitting: *Deleted

## 2022-07-21 DIAGNOSIS — E876 Hypokalemia: Secondary | ICD-10-CM | POA: Diagnosis not present

## 2022-07-21 DIAGNOSIS — D471 Chronic myeloproliferative disease: Secondary | ICD-10-CM | POA: Diagnosis not present

## 2022-07-21 DIAGNOSIS — Y92009 Unspecified place in unspecified non-institutional (private) residence as the place of occurrence of the external cause: Secondary | ICD-10-CM | POA: Diagnosis not present

## 2022-07-21 DIAGNOSIS — R9431 Abnormal electrocardiogram [ECG] [EKG]: Secondary | ICD-10-CM | POA: Diagnosis not present

## 2022-07-21 DIAGNOSIS — W19XXXA Unspecified fall, initial encounter: Secondary | ICD-10-CM

## 2022-07-21 DIAGNOSIS — S199XXA Unspecified injury of neck, initial encounter: Secondary | ICD-10-CM | POA: Diagnosis not present

## 2022-07-21 DIAGNOSIS — S0990XA Unspecified injury of head, initial encounter: Secondary | ICD-10-CM | POA: Diagnosis not present

## 2022-07-21 DIAGNOSIS — R6510 Systemic inflammatory response syndrome (SIRS) of non-infectious origin without acute organ dysfunction: Secondary | ICD-10-CM | POA: Diagnosis not present

## 2022-07-21 DIAGNOSIS — R509 Fever, unspecified: Secondary | ICD-10-CM | POA: Diagnosis present

## 2022-07-21 DIAGNOSIS — E878 Other disorders of electrolyte and fluid balance, not elsewhere classified: Secondary | ICD-10-CM | POA: Diagnosis not present

## 2022-07-21 DIAGNOSIS — R531 Weakness: Secondary | ICD-10-CM

## 2022-07-21 DIAGNOSIS — I10 Essential (primary) hypertension: Secondary | ICD-10-CM | POA: Diagnosis present

## 2022-07-21 DIAGNOSIS — M25561 Pain in right knee: Secondary | ICD-10-CM | POA: Diagnosis not present

## 2022-07-21 DIAGNOSIS — Z043 Encounter for examination and observation following other accident: Secondary | ICD-10-CM | POA: Diagnosis not present

## 2022-07-21 LAB — BLOOD GAS, ARTERIAL
Acid-base deficit: 2.5 mmol/L — ABNORMAL HIGH (ref 0.0–2.0)
Bicarbonate: 21.1 mmol/L (ref 20.0–28.0)
Drawn by: 33176
O2 Saturation: 94.2 %
Patient temperature: 37.5
pCO2 arterial: 32 mmHg (ref 32–48)
pH, Arterial: 7.43 (ref 7.35–7.45)
pO2, Arterial: 65 mmHg — ABNORMAL LOW (ref 83–108)

## 2022-07-21 LAB — CBC WITH DIFFERENTIAL/PLATELET
Abs Immature Granulocytes: 2.1 10*3/uL — ABNORMAL HIGH (ref 0.00–0.07)
Band Neutrophils: 9 %
Basophils Absolute: 0.5 10*3/uL — ABNORMAL HIGH (ref 0.0–0.1)
Basophils Relative: 1 %
Eosinophils Absolute: 0 10*3/uL (ref 0.0–0.5)
Eosinophils Relative: 0 %
HCT: 28.2 % — ABNORMAL LOW (ref 36.0–46.0)
Hemoglobin: 9 g/dL — ABNORMAL LOW (ref 12.0–15.0)
Lymphocytes Relative: 9 %
Lymphs Abs: 4.7 10*3/uL — ABNORMAL HIGH (ref 0.7–4.0)
MCH: 30.5 pg (ref 26.0–34.0)
MCHC: 31.9 g/dL (ref 30.0–36.0)
MCV: 95.6 fL (ref 80.0–100.0)
Metamyelocytes Relative: 3 %
Monocytes Absolute: 3.7 10*3/uL — ABNORMAL HIGH (ref 0.1–1.0)
Monocytes Relative: 7 %
Myelocytes: 1 %
Neutro Abs: 41.6 10*3/uL — ABNORMAL HIGH (ref 1.7–7.7)
Neutrophils Relative %: 70 %
Platelets: 170 10*3/uL (ref 150–400)
RBC: 2.95 MIL/uL — ABNORMAL LOW (ref 3.87–5.11)
RDW: 21.1 % — ABNORMAL HIGH (ref 11.5–15.5)
WBC: 52.7 10*3/uL (ref 4.0–10.5)
nRBC: 6 /100 WBC — ABNORMAL HIGH
nRBC: 9.1 % — ABNORMAL HIGH (ref 0.0–0.2)

## 2022-07-21 LAB — MAGNESIUM: Magnesium: 1.8 mg/dL (ref 1.7–2.4)

## 2022-07-21 LAB — URINALYSIS, ROUTINE W REFLEX MICROSCOPIC
Bilirubin Urine: NEGATIVE
Glucose, UA: NEGATIVE mg/dL
Hgb urine dipstick: NEGATIVE
Ketones, ur: NEGATIVE mg/dL
Leukocytes,Ua: NEGATIVE
Nitrite: NEGATIVE
Protein, ur: 30 mg/dL — AB
Specific Gravity, Urine: 1.012 (ref 1.005–1.030)
pH: 6 (ref 5.0–8.0)

## 2022-07-21 LAB — CULTURE, BLOOD (ROUTINE X 2): Special Requests: ADEQUATE

## 2022-07-21 LAB — PROTIME-INR
INR: 1.6 — ABNORMAL HIGH (ref 0.8–1.2)
Prothrombin Time: 19.1 seconds — ABNORMAL HIGH (ref 11.4–15.2)

## 2022-07-21 LAB — PHOSPHORUS: Phosphorus: 3.3 mg/dL (ref 2.5–4.6)

## 2022-07-21 LAB — COMPREHENSIVE METABOLIC PANEL
ALT: 14 U/L (ref 0–44)
AST: 22 U/L (ref 15–41)
Albumin: 2.5 g/dL — ABNORMAL LOW (ref 3.5–5.0)
Alkaline Phosphatase: 74 U/L (ref 38–126)
Anion gap: 5 (ref 5–15)
BUN: 25 mg/dL — ABNORMAL HIGH (ref 8–23)
CO2: 13 mmol/L — ABNORMAL LOW (ref 22–32)
Calcium: 5.2 mg/dL — CL (ref 8.9–10.3)
Chloride: 122 mmol/L — ABNORMAL HIGH (ref 98–111)
Creatinine, Ser: 0.92 mg/dL (ref 0.44–1.00)
GFR, Estimated: >60 mL/min (ref >60)
Glucose, Bld: 66 mg/dL — ABNORMAL LOW (ref 70–99)
Potassium: 2.5 mmol/L — CL (ref 3.5–5.1)
Sodium: 140 mmol/L (ref 135–145)
Total Bilirubin: 0.4 mg/dL (ref 0.3–1.2)
Total Protein: 5.1 g/dL — ABNORMAL LOW (ref 6.5–8.1)

## 2022-07-21 LAB — AMMONIA: Ammonia: 17 umol/L (ref 9–35)

## 2022-07-21 LAB — VITAMIN D 25 HYDROXY (VIT D DEFICIENCY, FRACTURES): Vit D, 25-Hydroxy: 19.04 ng/mL — ABNORMAL LOW (ref 30–100)

## 2022-07-21 LAB — CBG MONITORING, ED: Glucose-Capillary: 102 mg/dL — ABNORMAL HIGH (ref 70–99)

## 2022-07-21 LAB — APTT: aPTT: >200 seconds (ref 24–36)

## 2022-07-21 LAB — SARS CORONAVIRUS 2 BY RT PCR: SARS Coronavirus 2 by RT PCR: NEGATIVE

## 2022-07-21 LAB — LACTIC ACID, PLASMA: Lactic Acid, Venous: 0.8 mmol/L (ref 0.5–1.9)

## 2022-07-21 LAB — ETHANOL: Alcohol, Ethyl (B): <10 mg/dL (ref <10)

## 2022-07-21 MED ORDER — ENOXAPARIN SODIUM 40 MG/0.4ML IJ SOSY: 40.0000 mg | PREFILLED_SYRINGE | INTRAMUSCULAR | Status: DC

## 2022-07-21 MED ORDER — POTASSIUM CHLORIDE CRYS ER 20 MEQ PO TBCR
40.0000 meq | EXTENDED_RELEASE_TABLET | Freq: Once | ORAL | Status: DC
  Filled 2022-07-21: qty 2

## 2022-07-21 MED ORDER — CALCIUM GLUCONATE-NACL 1-0.675 GM/50ML-% IV SOLN: 1.0000 g | Freq: Once | INTRAVENOUS | Status: DC

## 2022-07-21 MED ORDER — SODIUM CHLORIDE 0.9 % IV SOLN
2.0000 g | Freq: Two times a day (BID) | INTRAVENOUS | Status: DC
  Administered 2022-07-21 – 2022-07-22 (×2): 2 g via INTRAVENOUS
  Filled 2022-07-21 (×2): qty 12.5

## 2022-07-21 MED ORDER — POTASSIUM CHLORIDE 10 MEQ/100ML IV SOLN
10.0000 meq | INTRAVENOUS | Status: AC
  Administered 2022-07-21 (×4): 10 meq via INTRAVENOUS
  Filled 2022-07-21 (×2): qty 100

## 2022-07-21 MED ORDER — TEMAZEPAM 15 MG PO CAPS
30.0000 mg | ORAL_CAPSULE | Freq: Every evening | ORAL | Status: DC | PRN
  Administered 2022-07-22: 30 mg via ORAL
  Filled 2022-07-21: qty 2

## 2022-07-21 MED ORDER — VANCOMYCIN HCL 750 MG/150ML IV SOLN: 750.0000 mg | INTRAVENOUS | Status: DC

## 2022-07-21 MED ORDER — ALLOPURINOL 100 MG PO TABS
200.0000 mg | ORAL_TABLET | Freq: Every day | ORAL | Status: DC
  Administered 2022-07-21 – 2022-07-23 (×3): 200 mg via ORAL
  Filled 2022-07-21 (×4): qty 2

## 2022-07-21 MED ORDER — POTASSIUM CHLORIDE CRYS ER 20 MEQ PO TBCR
40.0000 meq | EXTENDED_RELEASE_TABLET | Freq: Once | ORAL | Status: AC
  Administered 2022-07-21: 40 meq via ORAL

## 2022-07-21 MED ORDER — CALCIUM GLUCONATE-NACL 1-0.675 GM/50ML-% IV SOLN
1.0000 g | Freq: Once | INTRAVENOUS | Status: AC
  Administered 2022-07-21: 1000 mg via INTRAVENOUS
  Filled 2022-07-21: qty 50

## 2022-07-21 MED ORDER — POTASSIUM CHLORIDE IN NACL 40-0.9 MEQ/L-% IV SOLN: INTRAVENOUS | Status: DC

## 2022-07-21 MED ORDER — HYDROXYZINE HCL 25 MG PO TABS: 50.0000 mg | ORAL_TABLET | Freq: Three times a day (TID) | ORAL | Status: DC | PRN

## 2022-07-21 MED ORDER — VANCOMYCIN HCL 1250 MG/250ML IV SOLN
1250.0000 mg | Freq: Once | INTRAVENOUS | Status: AC
  Administered 2022-07-21: 1250 mg via INTRAVENOUS
  Filled 2022-07-21: qty 250

## 2022-07-21 MED ORDER — ONDANSETRON HCL 4 MG/2ML IJ SOLN: 4.0000 mg | Freq: Four times a day (QID) | INTRAMUSCULAR | Status: DC | PRN

## 2022-07-21 MED ORDER — ACETAMINOPHEN 650 MG RE SUPP: 650.0000 mg | Freq: Four times a day (QID) | RECTAL | Status: DC | PRN

## 2022-07-21 MED ORDER — ACETAMINOPHEN 325 MG PO TABS
650.0000 mg | ORAL_TABLET | Freq: Four times a day (QID) | ORAL | Status: DC | PRN
  Administered 2022-07-21: 650 mg via ORAL
  Filled 2022-07-21 (×2): qty 2

## 2022-07-21 MED ORDER — PANTOPRAZOLE SODIUM 40 MG PO TBEC
40.0000 mg | DELAYED_RELEASE_TABLET | Freq: Two times a day (BID) | ORAL | Status: DC
  Administered 2022-07-21 – 2022-07-23 (×4): 40 mg via ORAL
  Filled 2022-07-21 (×4): qty 1

## 2022-07-21 MED ORDER — METRONIDAZOLE 500 MG/100ML IV SOLN
500.0000 mg | Freq: Two times a day (BID) | INTRAVENOUS | Status: DC
  Administered 2022-07-21 – 2022-07-23 (×4): 500 mg via INTRAVENOUS
  Filled 2022-07-21 (×4): qty 100

## 2022-07-21 MED ORDER — POLYETHYLENE GLYCOL 3350 17 G PO PACK
17.0000 g | PACK | Freq: Every day | ORAL | Status: DC | PRN
  Administered 2022-07-22: 17 g via ORAL
  Filled 2022-07-21: qty 1

## 2022-07-21 MED ORDER — CALCIUM GLUCONATE-NACL 1-0.675 GM/50ML-% IV SOLN
1.0000 g | Freq: Once | INTRAVENOUS | Status: AC
  Administered 2022-07-22: 1000 mg via INTRAVENOUS
  Filled 2022-07-21: qty 50

## 2022-07-21 MED ORDER — ONDANSETRON HCL 4 MG PO TABS: 4.0000 mg | ORAL_TABLET | Freq: Four times a day (QID) | ORAL | Status: DC | PRN

## 2022-07-21 MED ORDER — LABETALOL HCL 5 MG/ML IV SOLN: 10.0000 mg | INTRAVENOUS | Status: DC | PRN

## 2022-07-21 MED ORDER — PREGABALIN 75 MG PO CAPS
75.0000 mg | ORAL_CAPSULE | Freq: Two times a day (BID) | ORAL | Status: DC
  Administered 2022-07-21 – 2022-07-23 (×4): 75 mg via ORAL
  Filled 2022-07-21 (×4): qty 1

## 2022-07-21 MED ORDER — MOMELOTINIB DIHYDROCHLORIDE 150 MG PO TABS: 150.0000 mg | ORAL_TABLET | Freq: Every day | ORAL | Status: DC

## 2022-07-21 NOTE — Progress Notes (Signed)
Patient BP 177/93, HR 79. Patient has IV Labetalol PRN for SBP>180. Patient states she has not taken her Metoprolol today. Secure chat sent to Dr. Carren Rang and orders given to continue to monitor.

## 2022-07-21 NOTE — Assessment & Plan Note (Addendum)
Elevated -Resume metoprolol - PRN IV labetalol

## 2022-07-21 NOTE — Assessment & Plan Note (Addendum)
Follows with Dr. Ellin Saba.  Last chemotherapy was 2 days ago in which dose of momelotinib was increased from 100 to 150 mg daily.  Leukocytosis of 52.7 today.  Prolonged APTT > 200.  INR 1.6.  PT -19.1.  No evidence of bleeding at this time.  Hemoglobin stable.  History of lupus which may cause prolonged APTT. -Hold off on pharmacologic DVT prophylaxis for now -Consider curbside consult of hematology considering prolonged APTT.

## 2022-07-21 NOTE — Assessment & Plan Note (Signed)
In setting of electrolyte abnormalities, and possible infection.

## 2022-07-21 NOTE — Telephone Encounter (Signed)
FYI patient in hospital

## 2022-07-21 NOTE — ED Notes (Signed)
Patient denies pain and is resting comfortably.  

## 2022-07-21 NOTE — Assessment & Plan Note (Signed)
Of unknown etiology.  She reports urinary incontinence, but UA with rare bacteria, port chest x-ray clear.  Lactic acid 0.8.  Chronic leukocytosis 52.7, due to myeloproliferative neoplasm.  Leukocytosis elevated up to 170 in the past year. -Check COVID -Broad-spectrum antibiotics IV Vanco cefepime and metronidazole for now as patient is immunocompromised on chemotherapy -Obtain blood cultures -Add on urine cultures

## 2022-07-21 NOTE — ED Provider Notes (Signed)
[place] EMERGENCY DEPARTMENT AT [facility] Provider Note   CSN: [id number] Arrival date & time: 07/21/22  1315     History  Chief Complaint  Patient presents with   Marletta Lor    Renee Waters is a 77 y.o. female with past medical history significant for GERD, depression, Barrett's esophagus, systemic lupus, CKD, neuromuscular disorder, who reports that she is undergoing chemotherapy at this time for a blood-borne cancer, who just received chemotherapy yesterday presents with concern for falling every time she stands up since last night.  Patient reports that does not feel like she has control over her body movements.  She denies dizziness, weakness.  She does endorse some pain of right knee, right leg, and hips from the falls.  She does not currently take any blood thinners.  Patient reports that she sees Dr. Ellin Saba for her chemotherapy.  Oncology notes report myeloproliferative neoplasm is the type of cancer that she has.  She does notably have frequent UTIs.  She denies any feeling of loss of control of bladder, denies dysuria, urgency.  But does report that she was having urination all over herself this morning and last night.  Of note it seems that her infusion was on the 11th.   Fall       Home Medications Prior to Admission medications   Medication Sig Start Date End Date Taking? Authorizing Provider  acyclovir (ZOVIRAX) 800 MG tablet Take 1 tablet (800 mg total) by mouth 5 (five) times daily as needed. 02/10/22   Johnson, Clanford L, MD  acyclovir ointment (ZOVIRAX) 5 % Apply 1 Application topically every 3 (three) hours as needed. 02/10/22   Johnson, Clanford L, MD  Allopurinol 200 MG TABS Take 200 mg by mouth daily. 05/13/22   [name], MD  busPIRone (BUSPAR) 5 MG tablet Take 1 tablet (5 mg total) by mouth daily as needed. 05/11/22   Kerri Perches, MD  cetirizine (ZYRTEC) 10 MG tablet Take 1 tablet by mouth once daily 05/23/22   Kerri Perches, MD   citalopram (CELEXA) 20 MG tablet Take 1 tablet by mouth once daily 07/12/22   Kerri Perches, MD  EPINEPHrine 0.3 mg/0.3 mL IJ SOAJ injection INJECT 0.3 MLS INTO MUSCLE ONCE AS NEEDED 01/11/22   Gardenia Phlegm, MD  furosemide (LASIX) 20 MG tablet TAKE 1 TABLET BY MOUTH ONCE DAILY AS NEEDED 01/04/22   [name], MD  hydrOXYzine (ATARAX) 50 MG tablet Take 1 tablet (50 mg total) by mouth 3 (three) times daily as needed. 07/11/22   [name], MD  lidocaine-prilocaine (EMLA) cream Apply 1 Application topically as needed (Access to port and pain). 11/15/21   [name], MD  metoprolol tartrate (LOPRESSOR) 25 MG tablet Take one and a half tablets twice daily 01/07/22   Kerri Perches, MD  mirtazapine (REMERON) 15 MG tablet Take 1 tablet (15 mg total) by mouth at bedtime. 03/09/22   Kerri Perches, MD  momelotinib dihydrochloride (OJJAARA) 150 MG tablet Take 1 tablet (150 mg total) by mouth daily. 07/11/22   [name], MD  Multiple Vitamin (MULTIVITAMIN WITH MINERALS) TABS tablet Take 1 tablet by mouth daily.    [provider]  ondansetron (ZOFRAN-ODT) 8 MG disintegrating tablet Take 1 tablet (8 mg total) by mouth every 8 (eight) hours as needed for nausea or vomiting. 03/01/22   [name], MD  oxyCODONE (ROXICODONE) 15 MG immediate release tablet Take 15 mg by mouth every 6 (six) hours as needed. 01/19/22  [provider]  pantoprazole (PROTONIX) 40 MG tablet TAKE ONE TABLET BY MOUTH 30 TO 60 MINUTES BEFORE YOUR FIRST AND LAST MEALS OF THE DAY 07/15/22   Nyoka Cowden, MD  potassium chloride (KLOR-CON) 10 MEQ tablet Take 1 tablet by mouth once daily 12/29/21   [name], MD  pregabalin (LYRICA) 75 MG capsule Take 1 capsule (75 mg total) by mouth 2 (two) times daily. 07/11/22   [name], MD  prochlorperazine (COMPAZINE) 10 MG tablet Take 1 tablet (10 mg total) by mouth every 6 (six) hours as needed for nausea  or vomiting. 01/04/22   [name], MD  temazepam (RESTORIL) 30 MG capsule Take 1 capsule (30 mg total) by mouth at bedtime as needed for sleep. 04/21/22   Kerri Perches, MD  tiZANidine (ZANAFLEX) 4 MG tablet Take 1 tablet (4 mg total) by mouth every 8 (eight) hours as needed for muscle spasms. 05/17/22   Kerri Perches, MD      Allergies    Bee venom, Norvasc [amlodipine], Tylenol [acetaminophen], Hydralazine, Red dye, Percocet [oxycodone-acetaminophen], and Tyloxapol    Review of Systems   Review of Systems  All other systems reviewed and are negative.   Physical Exam Updated Vital Signs BP (!) 183/84   Pulse 77   Temp 99.5 F (37.5 C) (Oral)   Resp 15   Ht 5\' 5"  (1.651 m)   Wt 52.6 kg   SpO2 92%   BMI 19.30 kg/m  Physical Exam Vitals and nursing note reviewed.  Constitutional:      General: She is not in acute distress.    Appearance: Normal appearance. She is ill-appearing.     Comments: Generally weak but nonseptic, nontoxic  HENT:     Head: Normocephalic and atraumatic.  Eyes:     General:        Right eye: No discharge.        Left eye: No discharge.  Cardiovascular:     Rate and Rhythm: Normal rate and regular rhythm.     Heart sounds: No murmur heard.    No friction rub. No gallop.  Pulmonary:     Effort: Pulmonary effort is normal.     Breath sounds: Normal breath sounds.  Abdominal:     General: Bowel sounds are normal.     Palpations: Abdomen is soft.  Skin:    General: Skin is warm and dry.     Capillary Refill: Capillary refill takes less than 2 seconds.  Neurological:     Mental Status: She is alert and oriented to person, place, and time.     Comments: Patient is unable to pass Romberg, unsteady attempted gait.  Otherwise with no focal neurologic deficits, intact finger-nose-finger.  She is somewhat tremulous throughout.  No facial droop.  CN II through XII grossly intact.  Psychiatric:        Mood and Affect: Mood normal.         Behavior: Behavior normal.     ED Results / Procedures / Treatments   Labs (all labs ordered are listed, but only abnormal results are displayed) Labs Reviewed  CBC WITH DIFFERENTIAL/PLATELET - Abnormal; Notable for the following components:      Result Value   WBC 52.7 (*)    RBC 2.95 (*)    Hemoglobin 9.0 (*)    HCT 28.2 (*)    RDW 21.1 (*)    nRBC 9.1 (*)    Neutro Abs 41.6 (*)  Lymphs Abs 4.7 (*)    Monocytes Absolute 3.7 (*)    Basophils Absolute 0.5 (*)    nRBC 6 (*)    Abs Immature Granulocytes 2.10 (*)    All other components within normal limits  APTT - Abnormal; Notable for the following components:   aPTT >200 (*)    All other components within normal limits  PROTIME-INR - Abnormal; Notable for the following components:   Prothrombin Time 19.1 (*)    INR 1.6 (*)    All other components within normal limits  URINALYSIS, ROUTINE W REFLEX MICROSCOPIC - Abnormal; Notable for the following components:   Protein, ur 30 (*)    Bacteria, UA RARE (*)    All other components within normal limits  COMPREHENSIVE METABOLIC PANEL - Abnormal; Notable for the following components:   Potassium 2.5 (*)    Chloride 122 (*)    CO2 13 (*)    Glucose, Bld 66 (*)    BUN 25 (*)    Calcium 5.2 (*)    Total Protein 5.1 (*)    Albumin 2.5 (*)    All other components within normal limits  BLOOD GAS, ARTERIAL - Abnormal; Notable for the following components:   pO2, Arterial 65 (*)    Acid-base deficit 2.5 (*)    All other components within normal limits  CBG MONITORING, ED - Abnormal; Notable for the following components:   Glucose-Capillary 102 (*)    All other components within normal limits  ETHANOL  LACTIC ACID, PLASMA  AMMONIA  MAGNESIUM  PHOSPHORUS  PARATHYROID HORMONE, INTACT (NO CA)  VITAMIN D 25 HYDROXY (VIT D DEFICIENCY, FRACTURES)  CALCITRIOL (1,25 DI-OH VIT D)    EKG None  Radiology CT Head Wo Contrast  Result Date: 07/21/2022 CLINICAL DATA:  Neck  trauma (Age >= 65y); Head trauma, minor (Age >= 65y) EXAM: CT HEAD WITHOUT CONTRAST CT CERVICAL SPINE WITHOUT CONTRAST TECHNIQUE: Multidetector CT imaging of the head and cervical spine was performed following the standard protocol without intravenous contrast. Multiplanar CT image reconstructions of the cervical spine were also generated. RADIATION DOSE REDUCTION: This exam was performed according to the departmental dose-optimization program which includes automated exposure control, adjustment of the mA and/or kV according to patient size and/or use of iterative reconstruction technique. COMPARISON:  None Available. FINDINGS: CT HEAD FINDINGS Brain: No evidence of acute infarction, hemorrhage, hydrocephalus, extra-axial collection or mass lesion/mass effect. Mild chronic microvascular ischemic change. Vascular: No hyperdense vessel or unexpected calcification. Skull: Nonspecific lucent lesion in the right frontal calvarium, unchanged compared to 09/01/2021. The inner and outer tables of the skull are intact. No acute fracture. Sinuses/Orbits: No middle ear or mastoid effusion. Paranasal sinuses are notable for mucosal thickening in the bilateral ethmoid sinuses. Bilateral lens replacement. Orbits are otherwise unremarkable. Other: None. CT CERVICAL SPINE FINDINGS Alignment: Trace retrolisthesis of C2 on C3. Multilevel degenerative endplate changes, most notably in the upper cervical spine. Skull base and vertebrae: No acute fracture. No primary bone lesion or focal pathologic process. Soft tissues and spinal canal: No prevertebral fluid or swelling. No visible canal hematoma. Disc levels: No evidence of high-grade spinal canal stenosis Upper chest: Negative. Other: None IMPRESSION: 1. No acute intracranial abnormality. 2. No acute fracture or traumatic malalignment of the cervical spine. 3. Nonspecific lucent lesion in the right frontal calvarium, unchanged compared to 09/01/2021 Electronically Signed   By:  Lorenza Cambridge M.D.   On: 07/21/2022 15:14   CT Cervical Spine Wo Contrast  Result  Date: 07/21/2022 CLINICAL DATA:  Neck trauma (Age >= 65y); Head trauma, minor (Age >= 65y) EXAM: CT HEAD WITHOUT CONTRAST CT CERVICAL SPINE WITHOUT CONTRAST TECHNIQUE: Multidetector CT imaging of the head and cervical spine was performed following the standard protocol without intravenous contrast. Multiplanar CT image reconstructions of the cervical spine were also generated. RADIATION DOSE REDUCTION: This exam was performed according to the departmental dose-optimization program which includes automated exposure control, adjustment of the mA and/or kV according to patient size and/or use of iterative reconstruction technique. COMPARISON:  None Available. FINDINGS: CT HEAD FINDINGS Brain: No evidence of acute infarction, hemorrhage, hydrocephalus, extra-axial collection or mass lesion/mass effect. Mild chronic microvascular ischemic change. Vascular: No hyperdense vessel or unexpected calcification. Skull: Nonspecific lucent lesion in the right frontal calvarium, unchanged compared to 09/01/2021. The inner and outer tables of the skull are intact. No acute fracture. Sinuses/Orbits: No middle ear or mastoid effusion. Paranasal sinuses are notable for mucosal thickening in the bilateral ethmoid sinuses. Bilateral lens replacement. Orbits are otherwise unremarkable. Other: None. CT CERVICAL SPINE FINDINGS Alignment: Trace retrolisthesis of C2 on C3. Multilevel degenerative endplate changes, most notably in the upper cervical spine. Skull base and vertebrae: No acute fracture. No primary bone lesion or focal pathologic process. Soft tissues and spinal canal: No prevertebral fluid or swelling. No visible canal hematoma. Disc levels: No evidence of high-grade spinal canal stenosis Upper chest: Negative. Other: None IMPRESSION: 1. No acute intracranial abnormality. 2. No acute fracture or traumatic malalignment of the cervical spine. 3.  Nonspecific lucent lesion in the right frontal calvarium, unchanged compared to 09/01/2021 Electronically Signed   By: Lorenza Cambridge M.D.   On: 07/21/2022 15:14   DG Pelvis Portable  Result Date: 07/21/2022 CLINICAL DATA:  Fall last night.  Bilateral leg pain/weakness. EXAM: PORTABLE PELVIS 1-2 VIEWS COMPARISON:  Radiographs 12/09/2021.  Pelvic CT 08/25/2021. FINDINGS: Status post right total hip arthroplasty the hardware appears intact, without evidence of loosening. There is no evidence of acute fracture or dislocation. No significant arthropathy of the left hip or sacroiliac joints. The soft tissues appear unremarkable. IMPRESSION: No evidence of acute fracture or dislocation. Intact right total hip arthroplasty. Electronically Signed   By: Carey Bullocks M.D.   On: 07/21/2022 14:40   DG Knee Complete 4 Views Right  Result Date: 07/21/2022 CLINICAL DATA:  Bilateral leg pain and weakness since falling last night. EXAM: RIGHT KNEE - COMPLETE 4+ VIEW COMPARISON:  Radiographs 01/05/2016. FINDINGS: The bones appear mildly demineralized. No evidence of acute fracture, dislocation or significant joint effusion. The joint spaces are preserved. There is a stable broad-based osseous excrescence from the lateral aspect of the distal femoral metadiaphysis, consistent with an osteochondroma. IMPRESSION: No evidence of acute right knee injury. Stable distal femoral osteochondroma. Electronically Signed   By: Carey Bullocks M.D.   On: 07/21/2022 14:39   DG Chest Port 1 View  Result Date: 07/21/2022 CLINICAL DATA:  Fall last night. EXAM: PORTABLE CHEST 1 VIEW COMPARISON:  Radiographs 05/25/2022 and 02/08/2022.  CT 06/30/2022. FINDINGS: 1401 hours. Right IJ Port-A-Cath extends to the mid SVC level, stable. The heart size and mediastinal contours are stable. The lungs are clear. There is no pleural effusion or pneumothorax. No acute osseous findings are evident. Telemetry leads overlie the chest. IMPRESSION: No  evidence of acute chest injury. Stable right IJ Port-A-Cath position. Electronically Signed   By: Carey Bullocks M.D.   On: 07/21/2022 14:37    Procedures .Critical Care  Performed by: Luther Hearing  H, PA-C Authorized by: Olene Floss, PA-C   Critical care provider statement:    Critical care time (minutes):  3033   Critical care was necessary to treat or prevent imminent or life-threatening deterioration of the following conditions:  Metabolic crisis (severe symptomatic hypokalemia, hypocalcemia)   Critical care was time spent personally by me on the following activities:  Development of treatment plan with patient or surrogate, discussions with consultants, evaluation of patient's response to treatment, examination of patient, ordering and review of laboratory studies, ordering and review of radiographic studies, ordering and performing treatments and interventions, pulse oximetry, re-evaluation of patient's condition and review of old charts   Care discussed with: admitting provider       Medications Ordered in ED Medications  potassium chloride 10 mEq in 100 mL IVPB (10 mEq Intravenous New Bag/Given 07/21/22 1502)  potassium chloride SA (KLOR-CON M) CR tablet 40 mEq (has no administration in time range)  calcium gluconate 1 g/ 50 mL sodium chloride IVPB (0 mg Intravenous Stopped 07/21/22 1611)    ED Course/ Medical Decision Making/ A&P Clinical Course as of 07/21/22 1702  Thu Jul 21, 2022  1430 Corrected calcium 6.4 [CP]    Clinical Course User Index [CP] Olene Floss, PA-C                             Medical Decision Making  This patient is a 77 y.o. female who presents to the ED for concern of weakness, urinary incontinence, this involves an extensive number of treatment options, and is a complaint that carries with it a high risk of complications and morbidity. The emergent differential diagnosis prior to evaluation includes, but is not limited to,   CVA, spinal cord injury, ACS, arrhythmia, syncope, orthostatic hypotension, sepsis, hypoglycemia, hypoxia, electrolyte disturbance, endocrine disorder, anemia, environmental exposure, polypharmacy . This is not an exhaustive differential.   Past Medical History / Co-morbidities / Social History: GERD, depression, Barrett's esophagus, systemic lupus, CKD, neuromuscular disorder  Additional history: Chart reviewed. Pertinent results include: Extensively reviewed lab work, imaging from previous emergency department visits, and outpatient oncology visits, notably patient with good comparison for her significant CBC abnormalities  Physical Exam: Physical exam performed. The pertinent findings include: Patient is chronically ill appearing, but nontoxic, nonseptic, with stable oxygen saturation, stable respiratory rate, normal heart rate.  She has some tremulous movement of arms, legs, and cannot stand unassisted for Romberg.  She is quite hypertensive in the emergency department, blood pressure 187/92 on arrival.  Lab Tests: I ordered, and personally interpreted labs.  The pertinent results include: UA overall unremarkable, CMP notable for critical hypokalemia, potassium 2.5, additionally with some hyperchloremia, chloride 122.  She is significantly hypocalcemic with calcium 5.2, after correcting for her albumin of 2.5 corrected calcium of 6.4.  CBC notable for significant leukocytosis, white blood cells 52.7, likely secondary to her myelodysplastic neoplasm, anemia of 9 is similar to her baseline.  No significant platelet dysfunction.  Her APTT is significantly elevated greater than 200, but PT/INR within normal range.  Normal range of magnesium, ammonia.   Imaging Studies: I ordered imaging studies including CT head, C-spine, plain film of the pelvis, right knee, and chest . I independently visualized and interpreted imaging which showed no acute fractures, dislocations, acute intracranial or  intrathoracic abnormality.  No unstable cervical spine fracture.. I agree with the radiologist interpretation.   Medications: I ordered medication including IV potassium, calcium gluconate  for hypokalemia, hypocalcemia.  Consultations Obtained: I requested consultation with the hospitalist, spoke with Dr. Mariea Clonts,  and discussed lab and imaging findings as well as pertinent plan - they recommend: Hospital admission for severe hypokalemia, hypocalcemia   Disposition: After consideration of the diagnostic results and the patients response to treatment, I feel that patient would benefit from hospital admission at this time as discussed above.   I discussed this case with my attending physician Dr. Criss Alvine who cosigned this note including patient's presenting symptoms, physical exam, and planned diagnostics and interventions. Attending physician stated agreement with plan or made changes to plan which were implemented.    Final Clinical Impression(s) / ED Diagnoses Final diagnoses:  None    Rx / DC Orders ED Discharge Orders     None         [place] 07/21/22 1702    Pricilla Loveless, MD 07/26/22 [phone]

## 2022-07-21 NOTE — Assessment & Plan Note (Addendum)
Multiple falls in the setting of electrolyte abnormalities, generalized weakness.  CT cervical CT pelvic x-ray, right knee x-ray negative for acute abnormality.  On exam she has poor coordination and blurry vision. -PT eval -If persistent symptoms after correction of electrolytes, will need further workup for poor coordination and blurry vision.

## 2022-07-21 NOTE — ED Notes (Signed)
MD notified re: pts WBC count

## 2022-07-21 NOTE — Progress Notes (Signed)
Per pharmacy Ojjaara will have to be approved with Oncology before administering. Medication was held for 1900 dose. Dr. Mariea Clonts notified through secure chat that medication was held.

## 2022-07-21 NOTE — H&P (Addendum)
History and Physical    Renee Waters ZOX:[id number] DOB: 1945/09/20 DOA: 07/21/2022  PCP: Kerri Perches, MD   Patient coming from: Home  I have personally briefly reviewed patient's old medical records in [facility]  Chief Complaint: Weakness, Falls, Loss of balance  HPI: Renee Waters is a 77 y.o. female with medical history significant for depression, hypertension, HSV-2, myeloproliferative disorder, lupus. Patient presented to the ED with complaints of fall since last night she reports about 6 falls.  Reports generalized weakness, loss of balance.  She reports urinary incontinence.  Reports she was unable to control her legs, with her legs going in a different direction from where she actually wanted to go.  Reports stable oral intake over the past year, not the best but it has improved from prior.  No vomiting.  No loose stools.  She does not take Lasix. She was told by the the navigator on the phone when she called that her speech was slowed, but patient denies slurred speech, feels her speech is at baseline, daughter at bedside does not think patient speech is slurred either.  ED Course: Temperature- max - 100.4.  Potassium 2.5.  Normal magnesium 1.8.  Calcium 5.2.  UA with rare bacteria.  Lactic acid 0.8.  CT elevated at greater than 200.  Significant leukocytosis consistent with prior at 52.7.  Multiple pelvic x-ray, right knee x-ray, portable chest x-ray, CT head, cervical CT negative for acute abnormality. Hospitalist to admit.   Review of Systems: As per HPI all other systems reviewed and negative.  Past Medical History:  Diagnosis Date   Acute cholangitis    Acute respiratory failure with hypoxia (HCC) 09/01/2021   Allergy    Anemia    Anxiety    Arthritis    Phreesia 03/18/2020   Barrett's esophagus    Cataract    Chronic back pain    Chronic neck pain    CKD (chronic kidney disease) stage 3, GFR 30-59 ml/min (HCC) 10/29/2020   Depression    Genital  herpes    GERD (gastroesophageal reflux disease)    H/O degenerative disc disease    History of blood transfusion    Hypertension    Hypoxia 12/01/2021   Insomnia    Lupus (systemic lupus erythematosus) (HCC)    Neuromuscular disorder (HCC)    Osteoarthritis    S/P colonoscopy 07/2003   normal, no polyps   S/P endoscopy June 2005, Oct 2009   2005: short-segment Barrett's, 2009: short-segment Barrett's   Upper respiratory tract infection 07/09/2020   UTI (lower urinary tract infection) 11/2012    Past Surgical History:  Procedure Laterality Date   ABDOMINAL HYSTERECTOMY     BACK SURGERY     BIOPSY N/A 03/20/2014   Procedure: BIOPSY;  Surgeon: Corbin Ade, MD;  Location: [facility];  Service: Endoscopy;  Laterality: N/A;   BIOPSY  09/14/2015   Procedure: BIOPSY;  Surgeon: Corbin Ade, MD;  Location: [facility];  Service: Endoscopy;;  esophageal and gastric   BIOPSY  07/23/2019   Procedure: BIOPSY;  Surgeon: Corbin Ade, MD;  Location: [facility];  Service: Endoscopy;;  esophageal    CARPAL TUNNEL RELEASE Left 2013   cervical disectomy  2002   CESAREAN SECTION N/A    Phreesia 03/18/2020   CHOLECYSTECTOMY     with lysis of adhesions for sbo; "ruptured gallbladder".   COLONOSCOPY  11/09/2011   RMR: Melanosis coli   COLONOSCOPY WITH PROPOFOL N/A 09/14/2015  Dr. Jena Gauss: diverticulosis, 3mm TA removed. next TCS 09/2020.    COLONOSCOPY WITH PROPOFOL N/A 04/30/2020   Procedure: COLONOSCOPY WITH PROPOFOL;  Surgeon: Corbin Ade, MD;  Location: [facility];  Service: Endoscopy;  Laterality: N/A;  PM (ASA 3)   DENTAL SURGERY  11/2015   multiple tooth extraction   ESOPHAGOGASTRODUODENOSCOPY  11/29/2007   salmon-colored  tongue   longest stable at  3 cm, distal esophagus as described previously status post biopsy/ Hiatal hernia, otherwise normal stomach D1 and D2   ESOPHAGOGASTRODUODENOSCOPY  01/06/11   short segment Barrett's esophagus s/p bx/Hiatal hernia    ESOPHAGOGASTRODUODENOSCOPY (EGD) WITH PROPOFOL N/A 03/20/2014   JYN:[id number] distal esophagus short segment barrett's, bx with no dysplasia. next egd in 03/2017   ESOPHAGOGASTRODUODENOSCOPY (EGD) WITH PROPOFOL N/A 09/14/2015   Dr. Jena Gauss: Barrett's without dysplasia, gastritis benign bx, hiatal hernia. next EGD 09/2018.   ESOPHAGOGASTRODUODENOSCOPY (EGD) WITH PROPOFOL N/A 07/23/2019   Procedure: ESOPHAGOGASTRODUODENOSCOPY (EGD) WITH PROPOFOL;  Surgeon: Corbin Ade, MD;  [facility];  Service: Endoscopy;  Laterality: N/A;  3:00pm   EYE SURGERY N/A    Phreesia 03/18/2020   HERNIA REPAIR Right 07/2010   Dr. Arlean Hopping   IR IMAGING GUIDED PORT INSERTION  02/09/2021   JOINT REPLACEMENT     LAPAROSCOPIC CHOLECYSTECTOMY  2017   at [facility]   POLYPECTOMY  09/14/2015   Procedure: POLYPECTOMY;  Surgeon: Corbin Ade, MD;  Location: [facility];  Service: Endoscopy;;  ascending colon   right hip replacement  07/2010   went back in sept 2012 to fix   SHOULDER ARTHROSCOPY  2008   left   SPINE SURGERY N/A    Phreesia 03/18/2020   TOTAL HIP REVISION Right 12/17/2012   Procedure: RIGHT TOTAL HIP REVISION;  Surgeon: [name], MD;  Location: [facility];  Service: Orthopedics;  Laterality: Right;   WRIST SURGERY Right 2011   open reduction right wrist.     reports that she quit smoking about 19 years ago. Her smoking use included cigarettes. She has a 6.25 pack-year smoking history. She has never used smokeless tobacco. She reports that she does not drink alcohol and does not use drugs.  Allergies  Allergen Reactions   Bee Venom Swelling and Hives   Norvasc [Amlodipine] Other (See Comments)    Hair loss   Tylenol [Acetaminophen] Itching   Hydralazine Other (See Comments)    Hair loss   Red Dye Itching   Percocet [Oxycodone-Acetaminophen] Rash and Other (See Comments)    Pt states, "this gives her a rash, but at home she takes oxycodone for pain relief"    Tyloxapol Itching and Rash     Family History  Problem Relation Age of Onset   Hypertension Mother    Stroke Mother    Colon cancer Neg Hx    Anesthesia problems Neg Hx    Hypotension Neg Hx    Malignant hyperthermia Neg Hx    Pseudochol deficiency Neg Hx    Gastric cancer Neg Hx    Esophageal cancer Neg Hx     Prior to Admission medications   Medication Sig Start Date End Date Taking? Authorizing Provider  acyclovir (ZOVIRAX) 800 MG tablet Take 1 tablet (800 mg total) by mouth 5 (five) times daily as needed. 02/10/22   Johnson, Clanford L, MD  acyclovir ointment (ZOVIRAX) 5 % Apply 1 Application topically every 3 (three) hours as needed. 02/10/22   Cleora Fleet, MD  Allopurinol 200 MG TABS  Take 200 mg by mouth daily. 05/13/22   [name], MD  busPIRone (BUSPAR) 5 MG tablet Take 1 tablet (5 mg total) by mouth daily as needed. 05/11/22   Kerri Perches, MD  cetirizine (ZYRTEC) 10 MG tablet Take 1 tablet by mouth once daily 05/23/22   Kerri Perches, MD  citalopram (CELEXA) 20 MG tablet Take 1 tablet by mouth once daily 07/12/22   Kerri Perches, MD  EPINEPHrine 0.3 mg/0.3 mL IJ SOAJ injection INJECT 0.3 MLS INTO MUSCLE ONCE AS NEEDED 01/11/22   Gardenia Phlegm, MD  furosemide (LASIX) 20 MG tablet TAKE 1 TABLET BY MOUTH ONCE DAILY AS NEEDED 01/04/22   [name], MD  hydrOXYzine (ATARAX) 50 MG tablet Take 1 tablet (50 mg total) by mouth 3 (three) times daily as needed. 07/11/22   [name], MD  lidocaine-prilocaine (EMLA) cream Apply 1 Application topically as needed (Access to port and pain). 11/15/21   [name], MD  metoprolol tartrate (LOPRESSOR) 25 MG tablet Take one and a half tablets twice daily 01/07/22   Kerri Perches, MD  mirtazapine (REMERON) 15 MG tablet Take 1 tablet (15 mg total) by mouth at bedtime. 03/09/22   Kerri Perches, MD  momelotinib dihydrochloride (OJJAARA) 150 MG tablet Take 1 tablet (150 mg total) by mouth daily. 07/11/22    [name], MD  Multiple Vitamin (MULTIVITAMIN WITH MINERALS) TABS tablet Take 1 tablet by mouth daily.    [provider]  ondansetron (ZOFRAN-ODT) 8 MG disintegrating tablet Take 1 tablet (8 mg total) by mouth every 8 (eight) hours as needed for nausea or vomiting. 03/01/22   [name], MD  oxyCODONE (ROXICODONE) 15 MG immediate release tablet Take 15 mg by mouth every 6 (six) hours as needed. 01/19/22   [provider]  pantoprazole (PROTONIX) 40 MG tablet TAKE ONE TABLET BY MOUTH 30 TO 60 MINUTES BEFORE YOUR FIRST AND LAST MEALS OF THE DAY 07/15/22   Nyoka Cowden, MD  potassium chloride (KLOR-CON) 10 MEQ tablet Take 1 tablet by mouth once daily 12/29/21   [name], MD  pregabalin (LYRICA) 75 MG capsule Take 1 capsule (75 mg total) by mouth 2 (two) times daily. 07/11/22   [name], MD  prochlorperazine (COMPAZINE) 10 MG tablet Take 1 tablet (10 mg total) by mouth every 6 (six) hours as needed for nausea or vomiting. 01/04/22   [name], MD  temazepam (RESTORIL) 30 MG capsule Take 1 capsule (30 mg total) by mouth at bedtime as needed for sleep. 04/21/22   Kerri Perches, MD  tiZANidine (ZANAFLEX) 4 MG tablet Take 1 tablet (4 mg total) by mouth every 8 (eight) hours as needed for muscle spasms. 05/17/22   Kerri Perches, MD    Physical Exam: Vitals:   07/21/22 1345 07/21/22 1400 07/21/22 1415 07/21/22 1530  BP: (!) 186/91 (!) 193/98 (!) 189/91 (!) 192/81  Pulse: 79 81 76 81  Resp: 20 13 16 18   Temp:      TempSrc:      SpO2: 97% 99% 98% 95%  Weight:      Height:        Constitutional: NAD, calm, comfortable Vitals:   07/21/22 1345 07/21/22 1400 07/21/22 1415 07/21/22 1530  BP: (!) 186/91 (!) 193/98 (!) 189/91 (!) 192/81  Pulse: 79 81 76 81  Resp: 20 13 16 18   Temp:      TempSrc:      SpO2: 97% 99% 98% 95%  Weight:      Height:       Eyes: PERRL, lids and conjunctivae normal ENMT: Mucous membranes are  moist. Neck: normal, supple, no masses, no thyromegaly Respiratory: clear to auscultation bilaterally, no wheezing, no crackles. Normal respiratory effort. No accessory muscle use.  Cardiovascular: Regular rate and rhythm, no murmurs / rubs / gallops. No extremity edema.  Extremities warm. Abdomen: no tenderness, no masses palpated. No hepatosplenomegaly. Bowel sounds positive.  Musculoskeletal: no clubbing / cyanosis. No joint deformity upper and lower extremities.  Skin: no rashes, lesions, ulcers. No induration Neurologic: Poor finger-to-nose coordination, EOMI, Speech clear and fluent without aphasia, 4+ out of 5 strength to all extremities, no facial asymmetry.  Blurry vision on exam Psychiatric: Normal judgment and insight. Alert and oriented x 3. Normal mood.   Labs on Admission: I have personally reviewed following labs and imaging studies  CBC: Recent Labs  Lab 07/19/22 1419 07/21/22 1350  WBC 38.2* 52.7*  NEUTROABS 22.5* 41.6*  HGB 7.9* 9.0*  HCT 25.7* 28.2*  MCV 98.1 95.6  PLT 130* 170   Basic Metabolic Panel: Recent Labs  Lab 07/19/22 1419 07/21/22 1350 07/21/22 1420  NA 137 140  --   K 4.6 2.5*  --   CL 108 122*  --   CO2 21* 13*  --   GLUCOSE 100* 66*  --   BUN 39* 25*  --   CREATININE 1.73* 0.92  --   CALCIUM 8.5* 5.2*  --   MG 2.0  --  1.8   GFR: Estimated Creatinine Clearance: 43.2 mL/min (by C-G formula based on SCr of 0.92 mg/dL). Liver Function Tests: Recent Labs  Lab 07/19/22 1419 07/21/22 1350  AST 27 22  ALT 17 14  ALKPHOS 128* 74  BILITOT 0.6 0.4  PROT 7.7 5.1*  ALBUMIN 3.6 2.5*   No results for input(s): "LIPASE", "AMYLASE" in the last 168 hours. Recent Labs  Lab 07/21/22 1420  AMMONIA 17   Coagulation Profile: Recent Labs  Lab 07/21/22 1350  INR 1.6*   Urine analysis:    Component Value Date/Time   COLORURINE YELLOW 07/21/2022 1405   APPEARANCEUR CLEAR 07/21/2022 1405   APPEARANCEUR Cloudy (A) 05/17/2022 1325   LABSPEC  1.012 07/21/2022 1405   PHURINE 6.0 07/21/2022 1405   GLUCOSEU NEGATIVE 07/21/2022 1405   GLUCOSEU NEG mg/dL 16/11/9602 5409   HGBUR NEGATIVE 07/21/2022 1405   HGBUR negative 08/19/2008 1124   BILIRUBINUR NEGATIVE 07/21/2022 1405   BILIRUBINUR Negative 05/17/2022 1325   KETONESUR NEGATIVE 07/21/2022 1405   PROTEINUR 30 (A) 07/21/2022 1405   UROBILINOGEN 0.2 03/14/2022 1443   UROBILINOGEN 0.2 12/05/2012 1150   NITRITE NEGATIVE 07/21/2022 1405   LEUKOCYTESUR NEGATIVE 07/21/2022 1405    Radiological Exams on Admission: CT Head Wo Contrast  Result Date: 07/21/2022 CLINICAL DATA:  Neck trauma (Age >= 65y); Head trauma, minor (Age >= 65y) EXAM: CT HEAD WITHOUT CONTRAST CT CERVICAL SPINE WITHOUT CONTRAST TECHNIQUE: Multidetector CT imaging of the head and cervical spine was performed following the standard protocol without intravenous contrast. Multiplanar CT image reconstructions of the cervical spine were also generated. RADIATION DOSE REDUCTION: This exam was performed according to the departmental dose-optimization program which includes automated exposure control, adjustment of the mA and/or kV according to patient size and/or use of iterative reconstruction technique. COMPARISON:  None Available. FINDINGS: CT HEAD FINDINGS Brain: No evidence of acute infarction, hemorrhage, hydrocephalus, extra-axial collection or mass lesion/mass effect. Mild chronic microvascular ischemic change. Vascular: No hyperdense vessel  or unexpected calcification. Skull: Nonspecific lucent lesion in the right frontal calvarium, unchanged compared to 09/01/2021. The inner and outer tables of the skull are intact. No acute fracture. Sinuses/Orbits: No middle ear or mastoid effusion. Paranasal sinuses are notable for mucosal thickening in the bilateral ethmoid sinuses. Bilateral lens replacement. Orbits are otherwise unremarkable. Other: None. CT CERVICAL SPINE FINDINGS Alignment: Trace retrolisthesis of C2 on C3. Multilevel  degenerative endplate changes, most notably in the upper cervical spine. Skull base and vertebrae: No acute fracture. No primary bone lesion or focal pathologic process. Soft tissues and spinal canal: No prevertebral fluid or swelling. No visible canal hematoma. Disc levels: No evidence of high-grade spinal canal stenosis Upper chest: Negative. Other: None IMPRESSION: 1. No acute intracranial abnormality. 2. No acute fracture or traumatic malalignment of the cervical spine. 3. Nonspecific lucent lesion in the right frontal calvarium, unchanged compared to 09/01/2021 Electronically Signed   By: Lorenza Cambridge M.D.   On: 07/21/2022 15:14   CT Cervical Spine Wo Contrast  Result Date: 07/21/2022 CLINICAL DATA:  Neck trauma (Age >= 65y); Head trauma, minor (Age >= 65y) EXAM: CT HEAD WITHOUT CONTRAST CT CERVICAL SPINE WITHOUT CONTRAST TECHNIQUE: Multidetector CT imaging of the head and cervical spine was performed following the standard protocol without intravenous contrast. Multiplanar CT image reconstructions of the cervical spine were also generated. RADIATION DOSE REDUCTION: This exam was performed according to the departmental dose-optimization program which includes automated exposure control, adjustment of the mA and/or kV according to patient size and/or use of iterative reconstruction technique. COMPARISON:  None Available. FINDINGS: CT HEAD FINDINGS Brain: No evidence of acute infarction, hemorrhage, hydrocephalus, extra-axial collection or mass lesion/mass effect. Mild chronic microvascular ischemic change. Vascular: No hyperdense vessel or unexpected calcification. Skull: Nonspecific lucent lesion in the right frontal calvarium, unchanged compared to 09/01/2021. The inner and outer tables of the skull are intact. No acute fracture. Sinuses/Orbits: No middle ear or mastoid effusion. Paranasal sinuses are notable for mucosal thickening in the bilateral ethmoid sinuses. Bilateral lens replacement. Orbits are  otherwise unremarkable. Other: None. CT CERVICAL SPINE FINDINGS Alignment: Trace retrolisthesis of C2 on C3. Multilevel degenerative endplate changes, most notably in the upper cervical spine. Skull base and vertebrae: No acute fracture. No primary bone lesion or focal pathologic process. Soft tissues and spinal canal: No prevertebral fluid or swelling. No visible canal hematoma. Disc levels: No evidence of high-grade spinal canal stenosis Upper chest: Negative. Other: None IMPRESSION: 1. No acute intracranial abnormality. 2. No acute fracture or traumatic malalignment of the cervical spine. 3. Nonspecific lucent lesion in the right frontal calvarium, unchanged compared to 09/01/2021 Electronically Signed   By: Lorenza Cambridge M.D.   On: 07/21/2022 15:14   DG Pelvis Portable  Result Date: 07/21/2022 CLINICAL DATA:  Fall last night.  Bilateral leg pain/weakness. EXAM: PORTABLE PELVIS 1-2 VIEWS COMPARISON:  Radiographs 12/09/2021.  Pelvic CT 08/25/2021. FINDINGS: Status post right total hip arthroplasty the hardware appears intact, without evidence of loosening. There is no evidence of acute fracture or dislocation. No significant arthropathy of the left hip or sacroiliac joints. The soft tissues appear unremarkable. IMPRESSION: No evidence of acute fracture or dislocation. Intact right total hip arthroplasty. Electronically Signed   By: Carey Bullocks M.D.   On: 07/21/2022 14:40   DG Knee Complete 4 Views Right  Result Date: 07/21/2022 CLINICAL DATA:  Bilateral leg pain and weakness since falling last night. EXAM: RIGHT KNEE - COMPLETE 4+ VIEW COMPARISON:  Radiographs 01/05/2016. FINDINGS: The bones  appear mildly demineralized. No evidence of acute fracture, dislocation or significant joint effusion. The joint spaces are preserved. There is a stable broad-based osseous excrescence from the lateral aspect of the distal femoral metadiaphysis, consistent with an osteochondroma. IMPRESSION: No evidence of acute  right knee injury. Stable distal femoral osteochondroma. Electronically Signed   By: Carey Bullocks M.D.   On: 07/21/2022 14:39   DG Chest Port 1 View  Result Date: 07/21/2022 CLINICAL DATA:  Fall last night. EXAM: PORTABLE CHEST 1 VIEW COMPARISON:  Radiographs 05/25/2022 and 02/08/2022.  CT 06/30/2022. FINDINGS: 1401 hours. Right IJ Port-A-Cath extends to the mid SVC level, stable. The heart size and mediastinal contours are stable. The lungs are clear. There is no pleural effusion or pneumothorax. No acute osseous findings are evident. Telemetry leads overlie the chest. IMPRESSION: No evidence of acute chest injury. Stable right IJ Port-A-Cath position. Electronically Signed   By: Carey Bullocks M.D.   On: 07/21/2022 14:37    EKG: Independently reviewed.  Sinus rhythm, rate 80, QTc 485.  No significant change from prior.  Assessment/Plan Principal Problem:   Electrolyte abnormality Active Problems:   Fever   MPN (myeloproliferative neoplasm) (HCC)   Fall at home, initial encounter   Generalized weakness   Essential hypertension   Assessment and Plan: * Electrolyte abnormality Hypokalemia 2.5, hypocalcemia 5.2- Corrected for hypoalbuminemia- 6.4.  Blood work was checked just 2 days ago- potassium was 4.6, calcium of 8.5.  Reports good oral intake, no GI losses. Per notes- 6/11 and daughter confirms-  increase in chemotherapy dose from 100 mg of momelotinib to 150 mg daily. Per UTD-side effects of Momelotinib related to electrolytes listed. -Replete electrolytes -Check phosphorus -Check 25, and 1, 25 vitamin D levels - PTH levels -May need to oncology curbside consult - N/s + 40 kcl 75 cc/hr x 15hrs    Fever Of unknown etiology.  She reports urinary incontinence, but UA with rare bacteria, port chest x-ray clear.  Lactic acid 0.8.  Chronic leukocytosis 52.7, due to myeloproliferative neoplasm.  Leukocytosis elevated up to 170 in the past year. -Check COVID -Broad-spectrum  antibiotics IV Vanco cefepime and metronidazole for now as patient is immunocompromised on chemotherapy -Obtain blood cultures -Add on urine cultures  Generalized weakness In setting of electrolyte abnormalities, and possible infection.  Fall at home, initial encounter Multiple falls in the setting of electrolyte abnormalities, generalized weakness.  CT cervical CT pelvic x-ray, right knee x-ray negative for acute abnormality.  On exam she has poor coordination and blurry vision. -PT eval -If persistent symptoms after correction of electrolytes, will need further workup for poor coordination and blurry vision.  MPN (myeloproliferative neoplasm) (HCC) Follows with Dr. Ellin Saba.  Last chemotherapy was 2 days ago in which dose of momelotinib was increased from 100 to 150 mg daily.  Leukocytosis of 52.7 today.  Prolonged APTT > 200.  INR 1.6.  PT -19.1.  No evidence of bleeding at this time.  Hemoglobin stable.  History of lupus which may cause prolonged APTT. -Hold off on pharmacologic DVT prophylaxis for now -Consider curbside consult of hematology considering prolonged APTT.   Essential hypertension Elevated -Resume metoprolol - PRN IV labetalol   DVT prophylaxis: SCDS Code Status: FULL-family patient and daughter at bedside Family Communication: Daughter [place] at bedside Disposition Plan: ~ 2 days  Consults called: None Admission status:  Obs tele    Author: Onnie Boer, MD 07/21/2022 5:59 PM  For on call review [email or web address].

## 2022-07-21 NOTE — ED Notes (Signed)
Patient transported to CT 

## 2022-07-21 NOTE — ED Triage Notes (Addendum)
Pt brought in by [facility] from home with c/o falling everytime she stands up since last night. EMS reports pt feels as if she doesn't have control over her body movements. Pt denies dizziness, weakness. Pt does c/o pain to right knee, right leg, and head from the falls.

## 2022-07-21 NOTE — Assessment & Plan Note (Addendum)
Hypokalemia 2.5, hypocalcemia 5.2- Corrected for hypoalbuminemia- 6.4.  Blood work was checked just 2 days ago- potassium was 4.6, calcium of 8.5.  Reports good oral intake, no GI losses. Per notes- 6/11 and daughter confirms-  increase in chemotherapy dose from 100 mg of momelotinib to 150 mg daily. Per UTD-side effects of Momelotinib related to electrolytes listed. -Replete electrolytes -Check phosphorus -Check 25, and 1, 25 vitamin D levels - PTH levels -May need to oncology curbside consult - N/s + 40 kcl 75 cc/hr x 15hrs

## 2022-07-21 NOTE — ED Notes (Signed)
PA notified re: Ca 5.2 and K+ 2.5

## 2022-07-21 NOTE — Telephone Encounter (Signed)
Patient called to advise that she has fallen x 6 this morning.  States that she is continuously incontinent of urine as of yesterday that has progressively gotten worse.  Loss of balance, weakness in all extremities, speech is slurred.  She is at home with her husband and was advised to call 911.  Verbalized understanding.

## 2022-07-21 NOTE — ED Notes (Signed)
Apple juice provided

## 2022-07-21 NOTE — Progress Notes (Signed)
Pharmacy Antibiotic Note  Renee Waters is a 77 y.o. female admitted on 07/21/2022 with sepsis in a immunocompromised patient on chemotherapy  Pharmacy has been consulted for Vancomycin and cefepime dosing.  Plan: Cefepime 2gm IV q12h Vancomycin 1250mg  IV x 1, then 750mg  IV q24h for eAUC 457 (goal 400-600) using Scr 0.92 Monitor cultures and sensitivities Monitor renal function and clinical course  Height: 5\' 5"  (165.1 cm) Weight: 52.6 kg (116 lb) IBW/kg (Calculated) : 57  Temp (24hrs), Avg:100 F (37.8 C), Min:99.5 F (37.5 C), Max:100.4 F (38 C)  Recent Labs  Lab 07/19/22 1419 07/21/22 1350 07/21/22 1420  WBC 38.2* 52.7*  --   CREATININE 1.73* 0.92  --   LATICACIDVEN  --   --  0.8    Estimated Creatinine Clearance: 43.2 mL/min (by C-G formula based on SCr of 0.92 mg/dL).    Allergies  Allergen Reactions   Bee Venom Swelling and Hives   Norvasc [Amlodipine] Other (See Comments)    Hair loss   Tylenol [Acetaminophen] Itching   Hydralazine Other (See Comments)    Hair loss   Red Dye Itching   Percocet [Oxycodone-Acetaminophen] Rash and Other (See Comments)    Pt states, "this gives her a rash, but at home she takes oxycodone for pain relief"    Tyloxapol Itching and Rash    Antimicrobials this admission: 6/13 Cefepime >>  6/13 Vancomycin >>  6/13 Metronidazole >>  Dose adjustments this admission: N/a  Microbiology results: 6/13 UCx: ordered    Reily Ilic A. Jeanella Craze, PharmD, BCPS, FNKF Clinical Pharmacist [place] Please utilize Amion for appropriate phone number to reach the unit pharmacist [facility])  07/21/2022 7:13 PM

## 2022-07-22 ENCOUNTER — Ambulatory Visit: Payer: Medicare Other | Admitting: Internal Medicine

## 2022-07-22 ENCOUNTER — Inpatient Hospital Stay (HOSPITAL_COMMUNITY): Payer: Medicare Other

## 2022-07-22 DIAGNOSIS — Y92009 Unspecified place in unspecified non-institutional (private) residence as the place of occurrence of the external cause: Secondary | ICD-10-CM | POA: Diagnosis not present

## 2022-07-22 DIAGNOSIS — I10 Essential (primary) hypertension: Secondary | ICD-10-CM | POA: Diagnosis not present

## 2022-07-22 DIAGNOSIS — Z885 Allergy status to narcotic agent status: Secondary | ICD-10-CM | POA: Diagnosis not present

## 2022-07-22 DIAGNOSIS — D630 Anemia in neoplastic disease: Secondary | ICD-10-CM | POA: Diagnosis present

## 2022-07-22 DIAGNOSIS — E8809 Other disorders of plasma-protein metabolism, not elsewhere classified: Secondary | ICD-10-CM | POA: Diagnosis present

## 2022-07-22 DIAGNOSIS — Z96641 Presence of right artificial hip joint: Secondary | ICD-10-CM | POA: Diagnosis present

## 2022-07-22 DIAGNOSIS — W19XXXA Unspecified fall, initial encounter: Secondary | ICD-10-CM | POA: Diagnosis present

## 2022-07-22 DIAGNOSIS — M329 Systemic lupus erythematosus, unspecified: Secondary | ICD-10-CM | POA: Diagnosis present

## 2022-07-22 DIAGNOSIS — L299 Pruritus, unspecified: Secondary | ICD-10-CM | POA: Diagnosis present

## 2022-07-22 DIAGNOSIS — D471 Chronic myeloproliferative disease: Secondary | ICD-10-CM | POA: Diagnosis not present

## 2022-07-22 DIAGNOSIS — G8929 Other chronic pain: Secondary | ICD-10-CM | POA: Diagnosis present

## 2022-07-22 DIAGNOSIS — Z1152 Encounter for screening for COVID-19: Secondary | ICD-10-CM | POA: Diagnosis not present

## 2022-07-22 DIAGNOSIS — T50995A Adverse effect of other drugs, medicaments and biological substances, initial encounter: Secondary | ICD-10-CM | POA: Diagnosis present

## 2022-07-22 DIAGNOSIS — E878 Other disorders of electrolyte and fluid balance, not elsewhere classified: Secondary | ICD-10-CM | POA: Diagnosis not present

## 2022-07-22 DIAGNOSIS — Z888 Allergy status to other drugs, medicaments and biological substances status: Secondary | ICD-10-CM | POA: Diagnosis not present

## 2022-07-22 DIAGNOSIS — E876 Hypokalemia: Secondary | ICD-10-CM | POA: Diagnosis present

## 2022-07-22 DIAGNOSIS — R296 Repeated falls: Secondary | ICD-10-CM | POA: Diagnosis present

## 2022-07-22 DIAGNOSIS — N1832 Chronic kidney disease, stage 3b: Secondary | ICD-10-CM | POA: Diagnosis present

## 2022-07-22 DIAGNOSIS — R6510 Systemic inflammatory response syndrome (SIRS) of non-infectious origin without acute organ dysfunction: Secondary | ICD-10-CM

## 2022-07-22 DIAGNOSIS — M545 Low back pain, unspecified: Secondary | ICD-10-CM | POA: Diagnosis not present

## 2022-07-22 DIAGNOSIS — I129 Hypertensive chronic kidney disease with stage 1 through stage 4 chronic kidney disease, or unspecified chronic kidney disease: Secondary | ICD-10-CM | POA: Diagnosis present

## 2022-07-22 DIAGNOSIS — Z8249 Family history of ischemic heart disease and other diseases of the circulatory system: Secondary | ICD-10-CM | POA: Diagnosis not present

## 2022-07-22 DIAGNOSIS — F32A Depression, unspecified: Secondary | ICD-10-CM | POA: Diagnosis present

## 2022-07-22 DIAGNOSIS — Z87891 Personal history of nicotine dependence: Secondary | ICD-10-CM | POA: Diagnosis not present

## 2022-07-22 DIAGNOSIS — R531 Weakness: Secondary | ICD-10-CM | POA: Diagnosis not present

## 2022-07-22 DIAGNOSIS — D84821 Immunodeficiency due to drugs: Secondary | ICD-10-CM | POA: Diagnosis present

## 2022-07-22 DIAGNOSIS — F1120 Opioid dependence, uncomplicated: Secondary | ICD-10-CM | POA: Diagnosis present

## 2022-07-22 LAB — TSH: TSH: 5.428 u[IU]/mL — ABNORMAL HIGH (ref 0.350–4.500)

## 2022-07-22 LAB — CBC
HCT: 30.6 % — ABNORMAL LOW (ref 36.0–46.0)
Hemoglobin: 9.7 g/dL — ABNORMAL LOW (ref 12.0–15.0)
MCH: 30.9 pg (ref 26.0–34.0)
MCHC: 31.7 g/dL (ref 30.0–36.0)
MCV: 97.5 fL (ref 80.0–100.0)
Platelets: 173 10*3/uL (ref 150–400)
RBC: 3.14 MIL/uL — ABNORMAL LOW (ref 3.87–5.11)
RDW: 21 % — ABNORMAL HIGH (ref 11.5–15.5)
WBC: 72.8 10*3/uL (ref 4.0–10.5)
nRBC: 6.3 % — ABNORMAL HIGH (ref 0.0–0.2)

## 2022-07-22 LAB — CALCITRIOL (1,25 DI-OH VIT D): Vit D, 1,25-Dihydroxy: 43.4 pg/mL (ref 24.8–81.5)

## 2022-07-22 LAB — BASIC METABOLIC PANEL
Anion gap: 7 (ref 5–15)
BUN: 26 mg/dL — ABNORMAL HIGH (ref 8–23)
CO2: 19 mmol/L — ABNORMAL LOW (ref 22–32)
Calcium: 9.1 mg/dL (ref 8.9–10.3)
Chloride: 110 mmol/L (ref 98–111)
Creatinine, Ser: 1.42 mg/dL — ABNORMAL HIGH (ref 0.44–1.00)
GFR, Estimated: 38 mL/min — ABNORMAL LOW (ref >60)
Glucose, Bld: 109 mg/dL — ABNORMAL HIGH (ref 70–99)
Potassium: 5.4 mmol/L — ABNORMAL HIGH (ref 3.5–5.1)
Sodium: 136 mmol/L (ref 135–145)

## 2022-07-22 LAB — CK: Total CK: 146 U/L (ref 38–234)

## 2022-07-22 LAB — CULTURE, BLOOD (ROUTINE X 2): Culture: NO GROWTH

## 2022-07-22 LAB — T4, FREE: Free T4: 0.86 ng/dL (ref 0.61–1.12)

## 2022-07-22 LAB — URINE CULTURE: Culture: NO GROWTH

## 2022-07-22 LAB — PARATHYROID HORMONE, INTACT (NO CA): PTH: 41 pg/mL (ref 15–65)

## 2022-07-22 LAB — PROCALCITONIN: Procalcitonin: 0.14 ng/mL

## 2022-07-22 LAB — FOLATE: Folate: 22.1 ng/mL (ref >5.9)

## 2022-07-22 LAB — APTT: aPTT: 36 seconds (ref 24–36)

## 2022-07-22 LAB — VITAMIN B12: Vitamin B-12: 1175 pg/mL — ABNORMAL HIGH (ref 180–914)

## 2022-07-22 MED ORDER — MOMELOTINIB DIHYDROCHLORIDE 150 MG PO TABS: 150.0000 mg | ORAL_TABLET | Freq: Every day | ORAL | Status: DC

## 2022-07-22 MED ORDER — CHLORHEXIDINE GLUCONATE CLOTH 2 % EX PADS
6.0000 | MEDICATED_PAD | Freq: Every day | CUTANEOUS | Status: DC
  Administered 2022-07-22 – 2022-07-23 (×2): 6 via TOPICAL

## 2022-07-22 MED ORDER — SODIUM CHLORIDE 0.9 % IV SOLN: INTRAVENOUS | Status: AC

## 2022-07-22 MED ORDER — OXYCODONE HCL 5 MG PO TABS
15.0000 mg | ORAL_TABLET | Freq: Four times a day (QID) | ORAL | Status: DC | PRN
  Administered 2022-07-22 – 2022-07-23 (×3): 15 mg via ORAL
  Filled 2022-07-22 (×3): qty 3

## 2022-07-22 MED ORDER — MOMELOTINIB DIHYDROCHLORIDE 150 MG PO TABS
150.0000 mg | ORAL_TABLET | Freq: Every day | ORAL | Status: DC
  Administered 2022-07-22 – 2022-07-23 (×2): 150 mg via ORAL

## 2022-07-22 MED ORDER — VITAMIN D 25 MCG (1000 UNIT) PO TABS
1000.0000 [IU] | ORAL_TABLET | Freq: Every day | ORAL | Status: DC
  Administered 2022-07-22 – 2022-07-23 (×2): 1000 [IU] via ORAL
  Filled 2022-07-22 (×2): qty 1

## 2022-07-22 MED ORDER — SODIUM CHLORIDE 0.9 % IV SOLN
2.0000 g | INTRAVENOUS | Status: DC
  Administered 2022-07-23: 2 g via INTRAVENOUS
  Filled 2022-07-22: qty 12.5

## 2022-07-22 MED ORDER — VANCOMYCIN HCL IN DEXTROSE 1-5 GM/200ML-% IV SOLN
1000.0000 mg | INTRAVENOUS | Status: DC
  Administered 2022-07-22: 1000 mg via INTRAVENOUS
  Filled 2022-07-22: qty 200

## 2022-07-22 MED ORDER — TIZANIDINE HCL 4 MG PO TABS
4.0000 mg | ORAL_TABLET | Freq: Three times a day (TID) | ORAL | Status: DC | PRN
  Administered 2022-07-22 – 2022-07-23 (×3): 4 mg via ORAL
  Filled 2022-07-22 (×3): qty 1

## 2022-07-22 NOTE — Hospital Course (Signed)
77 year old female with a history of lupus, myeloproliferative disorder, hypertension, depression presenting with 6 with last 24 hours.  Patient endorses generalized weakness but also states that she has had some leg weakness, right greater than left.  She has chronic back pain and right lower extremity pain.  She is on oxycodone 15 mg every 6 hours for this.  She denies any fevers, chills, chest pain, shortness breath, coughing up, this is, nausea, vomiting, diarrhea, abdominal pain, dysuria.  She states her p.o. intake has been fair.  She lives with her spouse and uses a cane and rollator. In the ED, the patient was afebrile hemodynamically stable with oxygen saturation 96% room air.  WBC 52.7, hemoglobin 9.0, platelets 170,000.  Sodium 140, potassium 2.5, corrected calcium 6.8, serum creatinine 0.92.  LFTs were unremarkable.  The patient was supplemented with potassium and calcium.  She was admitted for further evaluation and treatment. Her potassium and calcium levels were supplemented.  Further workup revealed CPK 146, folic acid 22.1, B12 1175, TSH 5.428, free T4--0.86.  MRI of the lumbar spine was subsequently obtained and was largely unremarkable except for L4-5 left foraminal impingement.  Otherwise, there were no red flags on the MRI of the lumbar spine.  With supplementation of the patient's electrolytes, the patient is lower extremity weakness and generalized weakness improved.  Blood cultures were obtained and remained negative.  The patient was initially placed on empiric vancomycin and cefepime.  These were subsequently discontinued when blood cultures remain negative.  UA was negative for pyuria.  Chest x-ray was negative for any infiltrates.  The patient remained hemodynamically stable and did not spike any further fevers.

## 2022-07-22 NOTE — Evaluation (Signed)
Physical Therapy Evaluation Patient Details Name: Renee Waters MRN: [id number] DOB: 1945/03/27 Today's Date: 07/22/2022  History of Present Illness  Renee Waters is a 77 y.o. female with medical history significant for depression, hypertension, HSV-2, myeloproliferative disorder, lupus.  Patient presented to the ED with complaints of fall since last night she reports about 6 falls.  Reports generalized weakness, loss of balance.  She reports urinary incontinence.  Reports she was unable to control her legs, with her legs going in a different direction from where she actually wanted to go.  Reports stable oral intake over the past year, not the best but it has improved from prior.  No vomiting.  No loose stools.  She does not take Lasix.  She was told by the the navigator on the phone when she called that her speech was slowed, but patient denies slurred speech, feels her speech is at baseline, daughter at bedside does not think patient speech is slurred either.   Clinical Impression  Patient had labored movement for sitting up at bedside, initially very unsteady on feet during sit to standing and taking a few steps to transfer to chair, after resting patient demonstrates improvement in standing balance and able to ambulate in hallway without loss of balance.  Patient tolerated sitting up in chair after therapy with her family member present.  Patient will benefit from continued skilled physical therapy in hospital and recommended venue below to increase strength, balance, endurance for safe ADLs and gait.         Recommendations for follow up therapy are one component of a multi-disciplinary discharge planning process, led by the attending physician.  Recommendations may be updated based on patient status, additional functional criteria and insurance authorization.  Follow Up Recommendations       Assistance Recommended at Discharge Set up Supervision/Assistance  Patient can return home with  the following  A little help with walking and/or transfers;A little help with bathing/dressing/bathroom;Help with stairs or ramp for entrance;Assistance with cooking/housework    Equipment Recommendations None recommended by PT  Recommendations for Other Services       Functional Status Assessment Patient has had a recent decline in their functional status and demonstrates the ability to make significant improvements in function in a reasonable and predictable amount of time.     Precautions / Restrictions Precautions Precautions: Fall Restrictions Weight Bearing Restrictions: No      Mobility  Bed Mobility Overal bed mobility: Needs Assistance Bed Mobility: Supine to Sit     Supine to sit: Min assist     General bed mobility comments: increased time, labored movement    Transfers Overall transfer level: Needs assistance Equipment used: Rolling walker (2 wheels) Transfers: Sit to/from Stand, Bed to chair/wheelchair/BSC Sit to Stand: Min assist   Step pivot transfers: Min assist       General transfer comment: unsteady labored movement with buckling of knees    Ambulation/Gait Ambulation/Gait assistance: Min assist, Min guard Gait Distance (Feet): 45 Feet Assistive device: Rolling walker (2 wheels) Gait Pattern/deviations: Decreased step length - right, Decreased step length - left, Decreased stride length Gait velocity: decreased     General Gait Details: slow labored cadence with mild buckling of knees, no loss of balance and limited mostly due to fatigue  Stairs            Wheelchair Mobility    Modified Rankin (Stroke Patients Only)       Balance Overall balance assessment: Needs assistance Sitting-balance support: Feet  supported, No upper extremity supported Sitting balance-Leahy Scale: Fair Sitting balance - Comments: fair/good seated at EOB   Standing balance support: During functional activity, Bilateral upper extremity  supported Standing balance-Leahy Scale: Fair Standing balance comment: using RW                             Pertinent Vitals/Pain Pain Assessment Pain Assessment: 0-10 Pain Score: 9  Pain Location: right hip radiating down leg Pain Descriptors / Indicators: Sore, Radiating, Discomfort Pain Intervention(s): Limited activity within patient's tolerance, Monitored during session, Patient requesting pain meds-RN notified    Home Living Family/patient expects to be discharged to:: Private residence Living Arrangements: Spouse/significant other Available Help at Discharge: Family;Available 24 hours/day Type of Home: House Home Access: Stairs to enter Entrance Stairs-Rails: Right Entrance Stairs-Number of Steps: 3-5   Home Layout: Two level;Able to live on main level with bedroom/bathroom;Full bath on main level Home Equipment: Rollator (4 wheels);Grab bars - tub/shower;BSC/3in1;Cane - single point      Prior Function Prior Level of Function : Independent/Modified Independent             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, occasionally drives ADLs Comments: Independent with ADLs     Hand Dominance   Dominant Hand: Right    Extremity/Trunk Assessment   Upper Extremity Assessment Upper Extremity Assessment: Generalized weakness    Lower Extremity Assessment Lower Extremity Assessment: Generalized weakness    Cervical / Trunk Assessment Cervical / Trunk Assessment: Normal  Communication   Communication: No difficulties  Cognition Arousal/Alertness: Awake/alert Behavior During Therapy: WFL for tasks assessed/performed Overall Cognitive Status: Within Functional Limits for tasks assessed                                          General Comments      Exercises     Assessment/Plan    PT Assessment Patient needs continued PT services  PT Problem List Decreased strength;Decreased activity tolerance;Decreased  balance;Decreased mobility       PT Treatment Interventions DME instruction;Gait training;Stair training;Functional mobility training;Therapeutic activities;Therapeutic exercise;Patient/family education;Balance training    PT Goals (Current goals can be found in the Care Plan section)  Acute Rehab PT Goals Patient Stated Goal: return home with family to assist PT Goal Formulation: With patient/family Time For Goal Achievement: 07/26/22 Potential to Achieve Goals: Good    Frequency Min 3X/week     Co-evaluation               AM-PAC PT "6 Clicks" Mobility  Outcome Measure Help needed turning from your back to your side while in a flat bed without using bedrails?: A Little Help needed moving from lying on your back to sitting on the side of a flat bed without using bedrails?: A Little Help needed moving to and from a bed to a chair (including a wheelchair)?: A Little Help needed standing up from a chair using your arms (e.g., wheelchair or bedside chair)?: A Lot Help needed to walk in hospital room?: A Little Help needed climbing 3-5 steps with a railing? : A Lot 6 Click Score: 16    End of Session   Activity Tolerance: Patient tolerated treatment well;Patient limited by fatigue Patient left: in chair;with call bell/phone within reach;with family/visitor present Nurse Communication: Mobility status PT Visit Diagnosis: Unsteadiness on feet (R26.81);Other  abnormalities of gait and mobility (R26.89);Muscle weakness (generalized) (M62.81)    Time: 4098-1191 PT Time Calculation (min) (ACUTE ONLY): 33 min   Charges:   PT Evaluation $PT Eval Moderate Complexity: 1 Mod PT Treatments $Therapeutic Activity: 23-37 mins        2:34 PM, 07/22/22 Ocie Bob, MPT Physical Therapist with [facility] 336 [phone] office [phone] mobile phone

## 2022-07-22 NOTE — Progress Notes (Signed)
Pharmacy Antibiotic Note  Renee Waters is a 77 y.o. female admitted on 07/21/2022 with sepsis in a immunocompromised patient on chemotherapy  Pt with worsening renal function.  Plan: Change Vanc to 1gm q 48 hrs Change Cefepime to 2gm q 24h Monitor renal function, cultures and sensitivities  Height: 5\' 5"  (165.1 cm) Weight: 52.6 kg (116 lb) IBW/kg (Calculated) : 57  Temp (24hrs), Avg:99.6 F (37.6 C), Min:98.6 F (37 C), Max:100.4 F (38 C)  Recent Labs  Lab 07/19/22 1419 07/21/22 1350 07/21/22 1420 07/22/22 0445  WBC 38.2* 52.7*  --  72.8*  CREATININE 1.73* 0.92  --  1.42*  LATICACIDVEN  --   --  0.8  --      Estimated Creatinine Clearance: 28 mL/min (A) (by C-G formula based on SCr of 1.42 mg/dL (H)).    Caryl Asp, PharmD Clinical Pharmacist 07/22/2022 4:07 PM

## 2022-07-22 NOTE — TOC Initial Note (Signed)
Transition of Care [facility]) - Initial/Assessment Note    Patient Details  Name: Renee Waters MRN: [id number] Date of Birth: 13-Feb-1945  [facility] CM/SW Contact:    Leitha Bleak, RN Phone Number: 07/22/2022, 2:41 PM  Clinical Narrative:                 Patient admitted with electrolyte abnormality Patient resting, daughter at bedside to give history. Patient has a high risk of readmission. Per daughter, patient is active with [facility] for HHPT. She has a cane and walker. She live with her husband. They are looking for CNA to provide hours of care in the home. MD aware to order HHPT. TOC following.   Expected Discharge Plan: Home w Home Health Services Barriers to Discharge: Continued Medical Work up   Patient Goals and CMS Choice Patient states their goals for this hospitalization and ongoing recovery are:: to go home. CMS Medicare.gov Compare Post Acute Care list provided to:: Patient Choice offered to / list presented to : Patient     Expected Discharge Plan and Services      Living arrangements for the past 2 months: Single Family Home                      Prior Living Arrangements/Services Living arrangements for the past 2 months: Single Family Home Lives with:: Spouse Patient language and need for interpreter reviewed:: Yes        Need for Family Participation in Patient Care: Yes (Comment) Care giver support system in place?: Yes (comment) Current home services: DME Criminal Activity/Legal Involvement Pertinent to Current Situation/Hospitalization: No - Comment as needed  Activities of Daily Living Home Assistive Devices/Equipment: Walker (specify type) ADL Screening (condition at time of admission) Patient's cognitive ability adequate to safely complete daily activities?: Yes Is the patient deaf or have difficulty hearing?: No Does the patient have difficulty seeing, even when wearing glasses/contacts?: No Does the patient have difficulty  concentrating, remembering, or making decisions?: No Patient able to express need for assistance with ADLs?: Yes Does the patient have difficulty dressing or bathing?: Yes Independently performs ADLs?: No Communication: Independent Dressing (OT): Needs assistance Is this a change from baseline?: Change from baseline, expected to last <3days Grooming: Needs assistance Is this a change from baseline?: Change from baseline, expected to last <3 days Feeding: Independent Bathing: Needs assistance Is this a change from baseline?: Change from baseline, expected to last <3 days Toileting: Needs assistance Is this a change from baseline?: Change from baseline, expected to last <3 days In/Out Bed: Needs assistance Is this a change from baseline?: Change from baseline, expected to last <3 days Walks in Home: Independent with device (comment) Does the patient have difficulty walking or climbing stairs?: Yes Weakness of Legs: Both Weakness of Arms/Hands: Both  Permission Sought/Granted           Permission granted to share info w Relationship: Daughter     Emotional Assessment     Affect (typically observed): Accepting   Alcohol / Substance Use: Not Applicable Psych Involvement: No (comment)  Admission diagnosis:  Electrolyte abnormality [E87.8] SIRS (systemic inflammatory response syndrome) (HCC) [R65.10] Patient Active Problem List   Diagnosis Date Noted   Electrolyte abnormality 07/21/2022   Difficulty voiding 02/28/2022   Urinary incontinence 02/28/2022   Hyperkalemia 02/09/2022   Essential hypertension 02/09/2022   Acute-on-chronic kidney injury (HCC) 02/08/2022   Pain in the shins 11/29/2021   Subcutaneous mass of both lower legs 11/29/2021  Encounter for support and coordination of transition of care 11/29/2021   Bronchiectasis assoc with possible tracheomalacia 11/11/2021   Chronic cough 11/01/2021   Generalized weakness 10/19/2021   Sinus bradycardia 10/19/2021    Hypertensive urgency 10/19/2021   Frequent UTI 10/19/2021   Transaminitis 10/19/2021   Thrombocytopenia (HCC) 10/19/2021   Cystitis 10/10/2021   SIRS (systemic inflammatory response syndrome) (HCC) 09/01/2021   Fall at home, initial encounter 09/01/2021   Hypotension    Hypomagnesemia 08/27/2021   Normocytic anemia 08/26/2021   AKI (acute kidney injury) (HCC) 08/25/2021   MPN (myeloproliferative neoplasm) (HCC) 04/21/2021   Anxiety 04/05/2021   Fever 01/21/2021   Leukocytosis 11/09/2020   CKD (chronic kidney disease) stage 3, GFR 30-59 ml/min (HCC) 10/29/2020   Dermatomycosis 08/02/2020   HSV-2 (herpes simplex virus 2) infection 08/02/2020   Rib pain on left side 03/21/2020   Itching 03/21/2020   Mildly underweight adult 09/19/2019   MDD (major depressive disorder), recurrent episode, moderate (HCC) 10/28/2016   Thoracic spine pain 08/31/2016   Anemia, normocytic normochromic 05/27/2015   IDA (iron deficiency anemia) 03/02/2015   Unsteady gait 02/25/2015   Mucosal abnormality of esophagus    Poor urinary stream 01/16/2013   Systemic lupus (HCC) 10/31/2012   Vitamin D deficiency 03/16/2012   Generalized pain 03/15/2012   Constipation 10/06/2011   Hip pain 06/29/2011   Barrett's esophagus 12/13/2010   Western blot positive HSV2 06/22/2007   Depression 06/22/2007   Labile hypertension 06/22/2007   GERD without esophagitis 06/22/2007   DJD (degenerative joint disease) 06/22/2007   NECK PAIN, CHRONIC 06/22/2007   Chronic midline low back pain with sciatica 06/22/2007   Insomnia secondary to depression with anxiety 06/22/2007   Viral infection, unspecified 06/22/2007   PCP:  Kerri Perches, MD Pharmacy:   [facility] [address] [place] - [address] [place] [address] Phone: [phone] Fax: [phone]  [facility] - [place], [place] - [name] [place] [address] Phone: [phone] Fax:  [phone]  [facility] [address], [place] - [address] [place] [address] Phone: [phone] Fax: [phone]  [place] [facility] [address] [place] [address] Phone: [phone] Fax: [phone]  [facility] [place], [place] - [address] [place] [address] Phone: [phone] Fax: [phone]  [facility] [place], [place] - [address] [place] [address] Phone: [phone] Fax: [phone]    Social Determinants of Health (SDOH) Social History: SDOH Screenings   Food Insecurity: No Food Insecurity (07/22/2022)  Housing: Low Risk  (07/22/2022)  Transportation Needs: No Transportation Needs (07/22/2022)  Utilities: Not At Risk (07/22/2022)  Alcohol Screen: Low Risk  (12/18/2019)  Depression (PHQ2-9): Low Risk  (04/20/2022)  Financial Resource Strain: Low Risk  (12/28/2020)  Physical Activity: Inactive (04/23/2021)  Social Connections: Moderately Integrated (12/28/2020)  Stress: Stress Concern Present (04/23/2021)  Tobacco Use: Medium Risk (07/21/2022)   SDOH Interventions:    Readmission Risk Interventions    07/22/2022    2:40 PM 02/09/2022   11:30 AM 11/12/2021    1:49 PM  Readmission Risk Prevention Plan  Transportation Screening Complete Complete Complete  PCP or Specialist Appt within 3-5 Days   Complete  HRI or Home Care Consult   Complete  Social Work Consult for Recovery Care Planning/Counseling  Complete  Palliative Care Screening   Complete  Medication Review (RN Care Manager) Complete Complete Complete  PCP or Specialist appointment within 3-5 days of discharge Not Complete    HRI or Home Care Consult Complete Complete   SW Recovery Care/Counseling Consult Complete Complete   Palliative Care Screening Not Applicable Not Applicable   Skilled Nursing Facility Not Applicable Complete

## 2022-07-22 NOTE — Progress Notes (Signed)
PROGRESS NOTE  Renee Waters VQQ:[id number] DOB: Nov 18, 1945 DOA: 07/21/2022 PCP: Kerri Perches, MD  Brief History:  77 year old female with a history of lupus, myeloproliferative disorder, hypertension, depression presenting with 6 with last 24 hours.  Patient endorses generalized weakness but also states that she has had some leg weakness, right greater than left.  She has chronic back pain and right lower extremity pain.  She is on oxycodone 15 mg every 6 hours for this.  She denies any fevers, chills, chest pain, shortness breath, coughing up, this is, nausea, vomiting, diarrhea, abdominal pain, dysuria.  She states her p.o. intake has been fair.  She lives with her spouse and uses a cane and rollator. In the ED, the patient was afebrile hemodynamically stable with oxygen saturation 96% room air.  WBC 52.7, hemoglobin 9.0, platelets 170,000.  Sodium 140, potassium 2.5, corrected calcium 6.4, serum creatinine 0.92.  LFTs were unremarkable.  The patient was supplemented with potassium and calcium.  She was admitted for further evaluation and treatment.   Assessment/Plan: Frequent falls/lower extremity weakness -X-ray pelvis negative for acute fracture or dislocation -X-ray right knee negative for acute fracture or dislocation -CT brain negative for acute findings -CT cervical spine negative for acute fracture -MRI lumbar spine -May be related to hypocalcemia and hypokalemia -PT evaluation>>HHPT -25 vitamin D 19.04 -Serum B12 1175 -CPK 146 -TSH 5.428  Hypokalemia -Repleting -Check magnesium -May be side effect of Momelotinib   Hypocalcemia -Supplemented -Repeat BMP  Fever unknown origin -Tmax 100.4 F -Continue empiric vancomycin and cefepime -Follow blood cultures -UA negative for pyuria -Chest x-ray negative for infiltrates -COVID-negative  MPL positive myeloproliferative neoplasm  -Patient follows Dr. Ellin Saba -Recent increase dose of  momelotinib>>continue -Discussed with Dr. Ellin Saba  CKD stage IIIb -Baseline creatinine 1.4-1.8 -Monitor BMP  Generalized itching -Patient placed on Atarax 50 mg 3 times daily and Lyrica 75 mg daily at home -Continue for now  Opioid dependence -PDMP reviewed -Patient receives oxycodone 15 mg monthly -Continue oxycodone     Family Communication:   daughter at bedside 6/14  Consultants:  none  Code Status:  FULL  DVT Prophylaxis:  [place] Heparin / [place] Lovenox   Procedures: As Listed in Progress Note Above  Antibiotics: None        Subjective: Patient complains of low back pain.  She complains of right leg pain around the thigh.  She denies any fevers, chills, chest pain, shortness of breath, nausea, vomiting, diarrhea, abdominal pain.  Objective: Vitals:   07/21/22 2137 07/22/22 0135 07/22/22 0612 07/22/22 1342  BP: (!) 166/100 (!) 167/89 (!) 176/84 (!) 138/92  Pulse: 79 86 96 94  Resp:  16 16 20   Temp:  99.6 F (37.6 C) 100.2 F (37.9 C) 98.6 F (37 C)  TempSrc:  Oral Oral Oral  SpO2:  96% 93% 99%  Weight:      Height:        Intake/Output Summary (Last 24 hours) at 07/22/2022 1817 Last data filed at 07/22/2022 1803 Gross per 24 hour  Intake 2087.29 ml  Output 1600 ml  Net 487.29 ml   Weight change:  Exam:  General:  Pt is alert, follows commands appropriately, not in acute distress HEENT: No icterus, No thrush, No neck mass, /AT Cardiovascular: RRR, S1/S2, no rubs, no gallops Respiratory: CTA bilaterally, no wheezing, no crackles, no rhonchi Abdomen: Soft/+BS, non tender, non distended, no guarding Extremities: No edema, No lymphangitis, No petechiae, No rashes,  no synovitis Neuro:  CN II-XII intact, strength 4/5 in RUE, 4-/5RLE, strength 4/5 LUE, LLE; sensation intact bilateral; no dysmetria; babinski equivocal Neg straight leg raise   Data Reviewed: I have personally reviewed following labs and imaging studies Basic Metabolic  Panel: Recent Labs  Lab 07/19/22 1419 07/21/22 1350 07/21/22 1420 07/21/22 1643 07/22/22 0445  NA 137 140  --   --  136  K 4.6 2.5*  --   --  5.4*  CL 108 122*  --   --  110  CO2 21* 13*  --   --  19*  GLUCOSE 100* 66*  --   --  109*  BUN 39* 25*  --   --  26*  CREATININE 1.73* 0.92  --   --  1.42*  CALCIUM 8.5* 5.2*  --   --  9.1  MG 2.0  --  1.8  --   --   PHOS  --   --   --  3.3  --    Liver Function Tests: Recent Labs  Lab 07/19/22 1419 07/21/22 1350  AST 27 22  ALT 17 14  ALKPHOS 128* 74  BILITOT 0.6 0.4  PROT 7.7 5.1*  ALBUMIN 3.6 2.5*   No results for input(s): "LIPASE", "AMYLASE" in the last 168 hours. Recent Labs  Lab 07/21/22 1420  AMMONIA 17   Coagulation Profile: Recent Labs  Lab 07/21/22 1350  INR 1.6*   CBC: Recent Labs  Lab 07/19/22 1419 07/21/22 1350 07/22/22 0445  WBC 38.2* 52.7* 72.8*  NEUTROABS 22.5* 41.6*  --   HGB 7.9* 9.0* 9.7*  HCT 25.7* 28.2* 30.6*  MCV 98.1 95.6 97.5  PLT 130* 170 173   Cardiac Enzymes: Recent Labs  Lab 07/22/22 1647  CKTOTAL 146   BNP: Invalid input(s): "POCBNP" CBG: Recent Labs  Lab 07/21/22 1620  GLUCAP 102*   HbA1C: No results for input(s): "HGBA1C" in the last 72 hours. Urine analysis:    Component Value Date/Time   COLORURINE YELLOW 07/21/2022 1405   APPEARANCEUR CLEAR 07/21/2022 1405   APPEARANCEUR Cloudy (A) 05/17/2022 1325   LABSPEC 1.012 07/21/2022 1405   PHURINE 6.0 07/21/2022 1405   GLUCOSEU NEGATIVE 07/21/2022 1405   GLUCOSEU NEG mg/dL 16/11/9602 5409   HGBUR NEGATIVE 07/21/2022 1405   HGBUR negative 08/19/2008 1124   BILIRUBINUR NEGATIVE 07/21/2022 1405   BILIRUBINUR Negative 05/17/2022 1325   KETONESUR NEGATIVE 07/21/2022 1405   PROTEINUR 30 (A) 07/21/2022 1405   UROBILINOGEN 0.2 03/14/2022 1443   UROBILINOGEN 0.2 12/05/2012 1150   NITRITE NEGATIVE 07/21/2022 1405   LEUKOCYTESUR NEGATIVE 07/21/2022 1405   Sepsis  Labs: @LABRCNTIP (procalcitonin:4,lacticidven:4) ) Recent Results (from the past 240 hour(s))  Urine Culture (for pregnant, neutropenic or urologic patients or patients with an indwelling urinary catheter)     Status: None   Collection Time: 07/21/22  2:05 PM   Specimen: Urine, Clean Catch  Result Value Ref Range Status   Specimen Description   Final    URINE, CLEAN CATCH Performed at [facility], [address] [place], [place] [address]    Special Requests   Final    NONE Performed at [facility], [address], [place], [place] [address]    Culture   Final    NO GROWTH Performed at [facility], [address], [place], [place] [address]    Report Status 07/22/2022 FINAL  Final  SARS Coronavirus 2 by RT PCR (hospital order, performed in [facility] hospital lab) *cepheid single  result test* Anterior Nasal Swab     Status: None   Collection Time: 07/21/22  6:23 PM   Specimen: Anterior Nasal Swab  Result Value Ref Range Status   SARS Coronavirus 2 by RT PCR NEGATIVE NEGATIVE Final    Comment: (NOTE) SARS-CoV-2 target nucleic acids are NOT DETECTED.  The SARS-CoV-2 RNA is generally detectable in upper and lower respiratory specimens during the acute phase of infection. The lowest concentration of SARS-CoV-2 viral copies this assay can detect is 250 copies / mL. A negative result does not preclude SARS-CoV-2 infection and should not be used as the sole basis for treatment or other patient management decisions.  A negative result may occur with improper specimen collection / handling, submission of specimen other than nasopharyngeal swab, presence of viral mutation(s) within the areas targeted by this assay, and inadequate number of viral copies (<250 copies / mL). A negative result must be combined with clinical observations, patient history, and epidemiological information.  Fact Sheet for Patients:   RoadLapTop.co.za  Fact Sheet for  Healthcare Providers: http://kim-miller.com/  This test is not yet approved or  cleared by the [place] FDA and has been authorized for detection and/or diagnosis of SARS-CoV-2 by FDA under an Emergency Use Authorization (EUA).  This EUA will remain in effect (meaning this test can be used) for the duration of the COVID-19 declaration under Section 564(b)(1) of the Act, 21 U.S.C. section 360bbb-3(b)(1), unless the authorization is terminated or revoked sooner.  Performed at [facility], [address], [place], [place] [address]   Culture, blood (Routine X 2) w Reflex to ID Panel     Status: None (Preliminary result)   Collection Time: 07/21/22  7:20 PM   Specimen: BLOOD  Result Value Ref Range Status   Specimen Description BLOOD BLOOD LEFT HAND  Final   Special Requests   Final    BOTTLES DRAWN AEROBIC AND ANAEROBIC Blood Culture adequate volume   Culture   Final    NO GROWTH < 12 HOURS Performed at [facility], [address], [place], [place] [address]    Report Status PENDING  Incomplete  Culture, blood (Routine X 2) w Reflex to ID Panel     Status: None (Preliminary result)   Collection Time: 07/21/22  7:20 PM   Specimen: BLOOD  Result Value Ref Range Status   Specimen Description BLOOD BLOOD RIGHT HAND  Final   Special Requests   Final    BOTTLES DRAWN AEROBIC AND ANAEROBIC Blood Culture adequate volume   Culture   Final    NO GROWTH < 12 HOURS Performed at [facility], [address] [place], [place] [address]    Report Status PENDING  Incomplete     Scheduled Meds:  allopurinol  200 mg Oral Daily   Chlorhexidine Gluconate Cloth  6 each Topical Daily   momelotinib dihydrochloride  150 mg Oral Daily   pantoprazole  40 mg Oral BID   pregabalin  75 mg Oral BID   Continuous Infusions:  sodium chloride Stopped (07/22/22 1734)   [START ON 07/23/2022] ceFEPime (MAXIPIME) IV     metronidazole 500 mg (07/22/22 1815)   vancomycin       Procedures/Studies: CT Head Wo Contrast  Result Date: 07/21/2022 CLINICAL DATA:  Neck trauma (Age >= 65y); Head trauma, minor (Age >= 65y) EXAM: CT HEAD WITHOUT CONTRAST CT CERVICAL SPINE WITHOUT CONTRAST TECHNIQUE: Multidetector CT imaging of the head and cervical spine was performed following the standard protocol without intravenous  contrast. Multiplanar CT image reconstructions of the cervical spine were also generated. RADIATION DOSE REDUCTION: This exam was performed according to the departmental dose-optimization program which includes automated exposure control, adjustment of the mA and/or kV according to patient size and/or use of iterative reconstruction technique. COMPARISON:  None Available. FINDINGS: CT HEAD FINDINGS Brain: No evidence of acute infarction, hemorrhage, hydrocephalus, extra-axial collection or mass lesion/mass effect. Mild chronic microvascular ischemic change. Vascular: No hyperdense vessel or unexpected calcification. Skull: Nonspecific lucent lesion in the right frontal calvarium, unchanged compared to 09/01/2021. The inner and outer tables of the skull are intact. No acute fracture. Sinuses/Orbits: No middle ear or mastoid effusion. Paranasal sinuses are notable for mucosal thickening in the bilateral ethmoid sinuses. Bilateral lens replacement. Orbits are otherwise unremarkable. Other: None. CT CERVICAL SPINE FINDINGS Alignment: Trace retrolisthesis of C2 on C3. Multilevel degenerative endplate changes, most notably in the upper cervical spine. Skull base and vertebrae: No acute fracture. No primary bone lesion or focal pathologic process. Soft tissues and spinal canal: No prevertebral fluid or swelling. No visible canal hematoma. Disc levels: No evidence of high-grade spinal canal stenosis Upper chest: Negative. Other: None IMPRESSION: 1. No acute intracranial abnormality. 2. No acute fracture or traumatic malalignment of the cervical spine. 3. Nonspecific lucent lesion in  the right frontal calvarium, unchanged compared to 09/01/2021 Electronically Signed   By: Lorenza Cambridge M.D.   On: 07/21/2022 15:14   CT Cervical Spine Wo Contrast  Result Date: 07/21/2022 CLINICAL DATA:  Neck trauma (Age >= 65y); Head trauma, minor (Age >= 65y) EXAM: CT HEAD WITHOUT CONTRAST CT CERVICAL SPINE WITHOUT CONTRAST TECHNIQUE: Multidetector CT imaging of the head and cervical spine was performed following the standard protocol without intravenous contrast. Multiplanar CT image reconstructions of the cervical spine were also generated. RADIATION DOSE REDUCTION: This exam was performed according to the departmental dose-optimization program which includes automated exposure control, adjustment of the mA and/or kV according to patient size and/or use of iterative reconstruction technique. COMPARISON:  None Available. FINDINGS: CT HEAD FINDINGS Brain: No evidence of acute infarction, hemorrhage, hydrocephalus, extra-axial collection or mass lesion/mass effect. Mild chronic microvascular ischemic change. Vascular: No hyperdense vessel or unexpected calcification. Skull: Nonspecific lucent lesion in the right frontal calvarium, unchanged compared to 09/01/2021. The inner and outer tables of the skull are intact. No acute fracture. Sinuses/Orbits: No middle ear or mastoid effusion. Paranasal sinuses are notable for mucosal thickening in the bilateral ethmoid sinuses. Bilateral lens replacement. Orbits are otherwise unremarkable. Other: None. CT CERVICAL SPINE FINDINGS Alignment: Trace retrolisthesis of C2 on C3. Multilevel degenerative endplate changes, most notably in the upper cervical spine. Skull base and vertebrae: No acute fracture. No primary bone lesion or focal pathologic process. Soft tissues and spinal canal: No prevertebral fluid or swelling. No visible canal hematoma. Disc levels: No evidence of high-grade spinal canal stenosis Upper chest: Negative. Other: None IMPRESSION: 1. No acute  intracranial abnormality. 2. No acute fracture or traumatic malalignment of the cervical spine. 3. Nonspecific lucent lesion in the right frontal calvarium, unchanged compared to 09/01/2021 Electronically Signed   By: Lorenza Cambridge M.D.   On: 07/21/2022 15:14   DG Pelvis Portable  Result Date: 07/21/2022 CLINICAL DATA:  Fall last night.  Bilateral leg pain/weakness. EXAM: PORTABLE PELVIS 1-2 VIEWS COMPARISON:  Radiographs 12/09/2021.  Pelvic CT 08/25/2021. FINDINGS: Status post right total hip arthroplasty the hardware appears intact, without evidence of loosening. There is no evidence of acute fracture or dislocation. No significant arthropathy  of the left hip or sacroiliac joints. The soft tissues appear unremarkable. IMPRESSION: No evidence of acute fracture or dislocation. Intact right total hip arthroplasty. Electronically Signed   By: Carey Bullocks M.D.   On: 07/21/2022 14:40   DG Knee Complete 4 Views Right  Result Date: 07/21/2022 CLINICAL DATA:  Bilateral leg pain and weakness since falling last night. EXAM: RIGHT KNEE - COMPLETE 4+ VIEW COMPARISON:  Radiographs 01/05/2016. FINDINGS: The bones appear mildly demineralized. No evidence of acute fracture, dislocation or significant joint effusion. The joint spaces are preserved. There is a stable broad-based osseous excrescence from the lateral aspect of the distal femoral metadiaphysis, consistent with an osteochondroma. IMPRESSION: No evidence of acute right knee injury. Stable distal femoral osteochondroma. Electronically Signed   By: Carey Bullocks M.D.   On: 07/21/2022 14:39   DG Chest Port 1 View  Result Date: 07/21/2022 CLINICAL DATA:  Fall last night. EXAM: PORTABLE CHEST 1 VIEW COMPARISON:  Radiographs 05/25/2022 and 02/08/2022.  CT 06/30/2022. FINDINGS: 1401 hours. Right IJ Port-A-Cath extends to the mid SVC level, stable. The heart size and mediastinal contours are stable. The lungs are clear. There is no pleural effusion or  pneumothorax. No acute osseous findings are evident. Telemetry leads overlie the chest. IMPRESSION: No evidence of acute chest injury. Stable right IJ Port-A-Cath position. Electronically Signed   By: Carey Bullocks M.D.   On: 07/21/2022 14:37   CT Chest Wo Contrast  Result Date: 07/06/2022 CLINICAL DATA:  Larey Seat 3 weeks ago, right-sided chest pain, mild cough, history of myeloproliferative disorder EXAM: CT CHEST WITHOUT CONTRAST TECHNIQUE: Multidetector CT imaging of the chest was performed following the standard protocol without IV contrast. RADIATION DOSE REDUCTION: This exam was performed according to the departmental dose-optimization program which includes automated exposure control, adjustment of the mA and/or kV according to patient size and/or use of iterative reconstruction technique. COMPARISON:  02/08/2022, 11/10/2021 FINDINGS: Cardiovascular: Unenhanced imaging of the heart is unremarkable without pericardial effusion. Calcification of the aortic valve. Normal caliber of the thoracic aorta. There is atherosclerosis of the aorta and coronary vasculature. Right chest wall port via internal jugular approach, tip within the superior vena cava. Evaluation of the vascular lumen is limited without IV contrast. Mediastinum/Nodes: No enlarged mediastinal or axillary lymph nodes. Thyroid gland, trachea, and esophagus demonstrate no significant findings. Lungs/Pleura: Stable upper lobe predominant emphysema. No acute airspace disease, effusion, or pneumothorax. Upper Abdomen: Spleen is enlarged. No other acute upper abdominal findings. Musculoskeletal: There are subacute healing right lateral sixth and seventh rib fractures, consistent with history of trauma 3 weeks ago. Please correlate with site of patient's pain. No other acute bony abnormalities. Multilevel thoracic spondylosis again noted. Reconstructed images demonstrate no additional findings. IMPRESSION: 1. Subacute healing right lateral sixth and  seventh rib fractures, consistent with history of trauma 3 weeks ago. Please correlate with site of patient's pain. 2. Otherwise no acute intrathoracic process. 3. Aortic Atherosclerosis (ICD10-I70.0) and Emphysema (ICD10-J43.9). 4. Splenomegaly. Electronically Signed   By: Sharlet Salina M.D.   On: 07/06/2022 13:55    Catarina Hartshorn, DO  [facility]  If 7PM-7AM, please contact night-coverage [email or web address] Password [facility] 07/22/2022, 6:17 PM   LOS: 0 days

## 2022-07-22 NOTE — Plan of Care (Signed)
  Problem: Acute Rehab PT Goals(only PT should resolve) Goal: Pt Will Go Supine/Side To Sit Outcome: Progressing Flowsheets (Taken 07/22/2022 1435) Pt will go Supine/Side to Sit: with min guard assist Goal: Patient Will Transfer Sit To/From Stand Outcome: Progressing Flowsheets (Taken 07/22/2022 1435) Patient will transfer sit to/from stand: with supervision Goal: Pt Will Transfer Bed To Chair/Chair To Bed Outcome: Progressing Flowsheets (Taken 07/22/2022 1435) Pt will Transfer Bed to Chair/Chair to Bed: min guard assist Goal: Pt Will Ambulate Outcome: Progressing Flowsheets (Taken 07/22/2022 1435) Pt will Ambulate:  75 feet  with min guard assist  with supervision  with rolling walker   2:35 PM, 07/22/22 Ocie Bob, MPT Physical Therapist with [facility] [phone] office [phone] mobile phone

## 2022-07-22 NOTE — Progress Notes (Signed)
   07/22/22 2440  Provider Notification  Provider Name/Title Dr. Carren Rang  Date Provider Notified 07/22/22  Time Provider Notified [phone]  Method of Notification Page (secure chat)  Notification Reason Critical Result  Test performed and critical result WBC 72.8  Date Critical Result Received 07/22/22  Time Critical Result Received [phone]

## 2022-07-23 DIAGNOSIS — D471 Chronic myeloproliferative disease: Secondary | ICD-10-CM | POA: Diagnosis not present

## 2022-07-23 DIAGNOSIS — R6510 Systemic inflammatory response syndrome (SIRS) of non-infectious origin without acute organ dysfunction: Secondary | ICD-10-CM | POA: Diagnosis not present

## 2022-07-23 DIAGNOSIS — R531 Weakness: Secondary | ICD-10-CM | POA: Diagnosis not present

## 2022-07-23 DIAGNOSIS — E878 Other disorders of electrolyte and fluid balance, not elsewhere classified: Secondary | ICD-10-CM | POA: Diagnosis not present

## 2022-07-23 LAB — CBC WITH DIFFERENTIAL/PLATELET
Abs Immature Granulocytes: 2.5 10*3/uL — ABNORMAL HIGH (ref 0.00–0.07)
Band Neutrophils: 1 %
Basophils Absolute: 0 10*3/uL (ref 0.0–0.1)
Basophils Relative: 0 %
Eosinophils Absolute: 0 10*3/uL (ref 0.0–0.5)
Eosinophils Relative: 0 %
HCT: 23.3 % — ABNORMAL LOW (ref 36.0–46.0)
Hemoglobin: 7.4 g/dL — ABNORMAL LOW (ref 12.0–15.0)
Lymphocytes Relative: 11 %
Lymphs Abs: 5.5 10*3/uL — ABNORMAL HIGH (ref 0.7–4.0)
MCH: 30.7 pg (ref 26.0–34.0)
MCHC: 31.8 g/dL (ref 30.0–36.0)
MCV: 96.7 fL (ref 80.0–100.0)
Metamyelocytes Relative: 5 %
Monocytes Absolute: 3.5 10*3/uL — ABNORMAL HIGH (ref 0.1–1.0)
Monocytes Relative: 7 %
Neutro Abs: 38.3 10*3/uL — ABNORMAL HIGH (ref 1.7–7.7)
Neutrophils Relative %: 76 %
Platelets: 101 10*3/uL — ABNORMAL LOW (ref 150–400)
RBC: 2.41 MIL/uL — ABNORMAL LOW (ref 3.87–5.11)
RDW: 20.4 % — ABNORMAL HIGH (ref 11.5–15.5)
WBC: 49.7 10*3/uL — ABNORMAL HIGH (ref 4.0–10.5)
nRBC: 4.5 % — ABNORMAL HIGH (ref 0.0–0.2)
nRBC: 5 /100 WBC — ABNORMAL HIGH

## 2022-07-23 LAB — BASIC METABOLIC PANEL
Anion gap: 6 (ref 5–15)
BUN: 26 mg/dL — ABNORMAL HIGH (ref 8–23)
CO2: 17 mmol/L — ABNORMAL LOW (ref 22–32)
Calcium: 8.3 mg/dL — ABNORMAL LOW (ref 8.9–10.3)
Chloride: 107 mmol/L (ref 98–111)
Creatinine, Ser: 1.57 mg/dL — ABNORMAL HIGH (ref 0.44–1.00)
GFR, Estimated: 34 mL/min — ABNORMAL LOW (ref >60)
Glucose, Bld: 96 mg/dL (ref 70–99)
Potassium: 4.3 mmol/L (ref 3.5–5.1)
Sodium: 130 mmol/L — ABNORMAL LOW (ref 135–145)

## 2022-07-23 LAB — COMPREHENSIVE METABOLIC PANEL
ALT: 26 U/L (ref 0–44)
AST: 33 U/L (ref 15–41)
Albumin: 3.1 g/dL — ABNORMAL LOW (ref 3.5–5.0)
Alkaline Phosphatase: 103 U/L (ref 38–126)
Anion gap: 2 — ABNORMAL LOW (ref 5–15)
BUN: 24 mg/dL — ABNORMAL HIGH (ref 8–23)
CO2: 20 mmol/L — ABNORMAL LOW (ref 22–32)
Calcium: 8.3 mg/dL — ABNORMAL LOW (ref 8.9–10.3)
Chloride: 110 mmol/L (ref 98–111)
Creatinine, Ser: 1.48 mg/dL — ABNORMAL HIGH (ref 0.44–1.00)
GFR, Estimated: 36 mL/min — ABNORMAL LOW (ref >60)
Glucose, Bld: 98 mg/dL (ref 70–99)
Potassium: 5.2 mmol/L — ABNORMAL HIGH (ref 3.5–5.1)
Sodium: 132 mmol/L — ABNORMAL LOW (ref 135–145)
Total Bilirubin: 1.1 mg/dL (ref 0.3–1.2)
Total Protein: 6.6 g/dL (ref 6.5–8.1)

## 2022-07-23 LAB — MAGNESIUM: Magnesium: 1.5 mg/dL — ABNORMAL LOW (ref 1.7–2.4)

## 2022-07-23 MED ORDER — MAGNESIUM SULFATE 2 GM/50ML IV SOLN
2.0000 g | Freq: Once | INTRAVENOUS | Status: AC
  Administered 2022-07-23: 2 g via INTRAVENOUS
  Filled 2022-07-23: qty 50

## 2022-07-23 MED ORDER — VITAMIN D3 25 MCG PO TABS: 1000.0000 [IU] | ORAL_TABLET | Freq: Every day | ORAL | Status: DC

## 2022-07-23 MED ORDER — METOPROLOL TARTRATE 25 MG PO TABS: 25.0000 mg | ORAL_TABLET | Freq: Two times a day (BID) | ORAL | 3 refills | Status: DC

## 2022-07-23 MED ORDER — HEPARIN SOD (PORK) LOCK FLUSH 100 UNIT/ML IV SOLN
500.0000 [IU] | INTRAVENOUS | Status: DC | PRN
  Filled 2022-07-23: qty 5

## 2022-07-23 NOTE — Discharge Summary (Signed)
Physician Discharge Summary   Patient: Renee Waters MRN: [id number] DOB: Jan 18, 1946  Admit date:     07/21/2022  Discharge date: 07/23/22  Discharge Physician: Onalee Hua Andriel Omalley   PCP: Kerri Perches, MD   Recommendations at discharge:   Please follow up with primary care provider within 1-2 weeks  Please repeat BMP and CBC in one week   Hospital Course: 77 year old female with a history of lupus, myeloproliferative disorder, hypertension, depression presenting with 6 with last 24 hours.  Patient endorses generalized weakness but also states that she has had some leg weakness, right greater than left.  She has chronic back pain and right lower extremity pain.  She is on oxycodone 15 mg every 6 hours for this.  She denies any fevers, chills, chest pain, shortness breath, coughing up, this is, nausea, vomiting, diarrhea, abdominal pain, dysuria.  She states her p.o. intake has been fair.  She lives with her spouse and uses a cane and rollator. In the ED, the patient was afebrile hemodynamically stable with oxygen saturation 96% room air.  WBC 52.7, hemoglobin 9.0, platelets 170,000.  Sodium 140, potassium 2.5, corrected calcium 6.4, serum creatinine 0.92.  LFTs were unremarkable.  The patient was supplemented with potassium and calcium.  She was admitted for further evaluation and treatment.  Assessment and Plan: Frequent falls/lower extremity weakness -X-ray pelvis negative for acute fracture or dislocation -X-ray right knee negative for acute fracture or dislocation -CT brain negative for acute findings -CT cervical spine negative for acute fracture -MRI lumbar spine--L4-5 left foraminal impingement.  Bulky degenerative facet spurring with anterior listhesis L4-5.  Patent spinal canal. -May be related to hypocalcemia and hypokalemia -PT evaluation>>HHPT -25 vitamin D 19.04 -Serum B12 1175 -CPK 146 -TSH 5.428 -Overall, leg weakness and generalized weakness improved with correction of the  patient's potassium and calcium.   Hypokalemia/hypomagnesemia -Repleting -Check magnesium--1.5 -May be side effect of Momelotinib (hypokalemia)   Hypocalcemia -corrected calcium 6.8 at time of admission -Supplemented -25 vitamin D--19.04>>supplement   Fever unknown origin -Tmax 100.4 F -Continue empiric vancomycin and cefepime -Follow blood cultures--remains negative -UA negative for pyuria -Chest x-ray negative for infiltrates -COVID-negative -d/c antibiotics as fever resolved and pt remains hemodynamically stable   MPL positive myeloproliferative neoplasm  -Patient follows Dr. Ellin Saba -Recent increase dose of momelotinib>>continue -Discussed with Dr. Ellin Saba   CKD stage IIIb -Baseline creatinine 1.4-1.8 -Monitor BMP   Generalized itching -Patient placed on Atarax 50 mg 3 times daily and Lyrica 75 mg daily at home -Continue for now   Opioid dependence -PDMP reviewed -Patient receives oxycodone 15 mg monthly -Continue oxycodone  Chronic leukemoid reaction -pt has chronically elevated WBC -increase from baseline due to acute medical illness -overall improved and back to baseline at time of d/c--49.7           Consultants: none Procedures performed: none  Disposition: Home Diet recommendation:  Regular diet DISCHARGE MEDICATION: Allergies as of 07/23/2022       Reactions   Bee Venom Swelling, Hives   Norvasc [amlodipine] Other (See Comments)   Hair loss   Tylenol [acetaminophen] Itching   Hydralazine Other (See Comments)   Hair loss   Red Dye Itching   Percocet [oxycodone-acetaminophen] Rash, Other (See Comments)   Pt states, "this gives her a rash, but at home she takes oxycodone for pain relief"   Tyloxapol Itching, Rash        Medication List     STOP taking these medications    busPIRone 5 MG  tablet Commonly known as: BUSPAR   citalopram 20 MG tablet Commonly known as: CELEXA       TAKE these medications    acyclovir 800  MG tablet Commonly known as: ZOVIRAX Take 1 tablet (800 mg total) by mouth 5 (five) times daily as needed.   Allopurinol 200 MG Tabs Take 200 mg by mouth daily.   cetirizine 10 MG tablet Commonly known as: ZYRTEC Take 1 tablet by mouth once daily   EPINEPHrine 0.3 mg/0.3 mL Soaj injection Commonly known as: EPI-PEN INJECT 0.3 MLS INTO MUSCLE ONCE AS NEEDED   furosemide 20 MG tablet Commonly known as: LASIX TAKE 1 TABLET BY MOUTH ONCE DAILY AS NEEDED   hydrOXYzine 50 MG tablet Commonly known as: ATARAX Take 1 tablet (50 mg total) by mouth 3 (three) times daily as needed.   lidocaine-prilocaine cream Commonly known as: EMLA Apply 1 Application topically as needed (Access to port and pain).   metoprolol tartrate 25 MG tablet Commonly known as: LOPRESSOR Take 1 tablet (25 mg total) by mouth 2 (two) times daily. Take one and a half tablets twice daily What changed:  how much to take how to take this when to take this   mirtazapine 15 MG tablet Commonly known as: REMERON Take 1 tablet (15 mg total) by mouth at bedtime.   multivitamin with minerals Tabs tablet Take 1 tablet by mouth daily.   Ojjaara 150 MG tablet Generic drug: momelotinib dihydrochloride Take 1 tablet (150 mg total) by mouth daily.   ondansetron 8 MG disintegrating tablet Commonly known as: ZOFRAN-ODT Take 1 tablet (8 mg total) by mouth every 8 (eight) hours as needed for nausea or vomiting.   oxyCODONE 15 MG immediate release tablet Commonly known as: ROXICODONE Take 15 mg by mouth every 6 (six) hours as needed.   pantoprazole 40 MG tablet Commonly known as: PROTONIX TAKE ONE TABLET BY MOUTH 30 TO 60 MINUTES BEFORE YOUR FIRST AND LAST MEALS OF THE DAY   potassium chloride 10 MEQ tablet Commonly known as: KLOR-CON Take 1 tablet by mouth once daily   pregabalin 75 MG capsule Commonly known as: Lyrica Take 1 capsule (75 mg total) by mouth 2 (two) times daily.   prochlorperazine 10 MG  tablet Commonly known as: COMPAZINE Take 1 tablet (10 mg total) by mouth every 6 (six) hours as needed for nausea or vomiting.   temazepam 30 MG capsule Commonly known as: Restoril Take 1 capsule (30 mg total) by mouth at bedtime as needed for sleep.   tiZANidine 4 MG tablet Commonly known as: ZANAFLEX Take 1 tablet (4 mg total) by mouth every 8 (eight) hours as needed for muscle spasms.   vitamin D3 25 MCG tablet Commonly known as: CHOLECALCIFEROL Take 1 tablet (1,000 Units total) by mouth daily. Start taking on: July 24, 2022        Follow-up Information     [place], [facility] Follow up.   Why: PT will call to schedule your next visit. Contact information: [address] [place] [address] [phone]                Discharge Exam: Filed Weights   07/21/22 1327  Weight: 52.6 kg   HEENT:  /AT, No thrush, no icterus CV:  RRR, no rub, no S3, no S4 Lung:  CTA, no wheeze, no rhonchi Abd:  soft/+BS, NT Ext:  No edema, no lymphangitis, no synovitis, no rash   Condition at discharge: stable  The results of significant diagnostics from this hospitalization (including imaging, microbiology, ancillary and laboratory) are listed below for reference.   Imaging Studies: MR LUMBAR SPINE WO CONTRAST  Result Date: 07/22/2022 CLINICAL DATA:  Low back pain with cancer suspected. Right leg weakness EXAM: MRI LUMBAR SPINE WITHOUT CONTRAST TECHNIQUE: Multiplanar, multisequence MR imaging of the lumbar spine was performed. No intravenous contrast was administered. COMPARISON:  06/23/2003 FINDINGS: Segmentation:  Standard. Alignment:  L4-5 anterolisthesis, facet mediated. Vertebrae: Diffusely low marrow signal on T1 weighted imaging. No evidence of fracture or focal bone lesion. Conus medullaris and cauda equina: Conus extends to the L2-3 level. Conus and cauda equina appear normal. Paraspinal and other soft tissues: Negative for Perispinal mass or  inflammation. Large spleen projecting to the level of the interpolar left kidney. Disc levels: L1-L2: Mild degenerative facet spurring L2-L3: Moderate degenerative facet spurring L3-L4: Mild disc height loss and bulging. Degenerative facet spurring greater on the right. L4-L5: Bulky degenerative facet spurring with anterolisthesis. The disc is narrowed and circumferentially bulging with high-grade left and more mild right foraminal stenosis. Patent spinal canal L5-S1:Disc narrowing with endplate and facet spurring. No neural compression. IMPRESSION: 1. Generalized alteration of marrow signal with splenic enlargement likely related to patient's chart history of myeloproliferative disorder. 2. Lumbar spine degeneration especially affecting facets with L4-5 anterolisthesis. 3. L4-5 left foraminal impingement. No nerve root compression on the symptomatic right side. Electronically Signed   By: Tiburcio Pea M.D.   On: 07/22/2022 20:20   CT Head Wo Contrast  Result Date: 07/21/2022 CLINICAL DATA:  Neck trauma (Age >= 65y); Head trauma, minor (Age >= 65y) EXAM: CT HEAD WITHOUT CONTRAST CT CERVICAL SPINE WITHOUT CONTRAST TECHNIQUE: Multidetector CT imaging of the head and cervical spine was performed following the standard protocol without intravenous contrast. Multiplanar CT image reconstructions of the cervical spine were also generated. RADIATION DOSE REDUCTION: This exam was performed according to the departmental dose-optimization program which includes automated exposure control, adjustment of the mA and/or kV according to patient size and/or use of iterative reconstruction technique. COMPARISON:  None Available. FINDINGS: CT HEAD FINDINGS Brain: No evidence of acute infarction, hemorrhage, hydrocephalus, extra-axial collection or mass lesion/mass effect. Mild chronic microvascular ischemic change. Vascular: No hyperdense vessel or unexpected calcification. Skull: Nonspecific lucent lesion in the right frontal  calvarium, unchanged compared to 09/01/2021. The inner and outer tables of the skull are intact. No acute fracture. Sinuses/Orbits: No middle ear or mastoid effusion. Paranasal sinuses are notable for mucosal thickening in the bilateral ethmoid sinuses. Bilateral lens replacement. Orbits are otherwise unremarkable. Other: None. CT CERVICAL SPINE FINDINGS Alignment: Trace retrolisthesis of C2 on C3. Multilevel degenerative endplate changes, most notably in the upper cervical spine. Skull base and vertebrae: No acute fracture. No primary bone lesion or focal pathologic process. Soft tissues and spinal canal: No prevertebral fluid or swelling. No visible canal hematoma. Disc levels: No evidence of high-grade spinal canal stenosis Upper chest: Negative. Other: None IMPRESSION: 1. No acute intracranial abnormality. 2. No acute fracture or traumatic malalignment of the cervical spine. 3. Nonspecific lucent lesion in the right frontal calvarium, unchanged compared to 09/01/2021 Electronically Signed   By: Lorenza Cambridge M.D.   On: 07/21/2022 15:14   CT Cervical Spine Wo Contrast  Result Date: 07/21/2022 CLINICAL DATA:  Neck trauma (Age >= 65y); Head trauma, minor (Age >= 65y) EXAM: CT HEAD WITHOUT CONTRAST CT CERVICAL SPINE WITHOUT CONTRAST TECHNIQUE: Multidetector CT imaging of the head and cervical spine was performed following the  standard protocol without intravenous contrast. Multiplanar CT image reconstructions of the cervical spine were also generated. RADIATION DOSE REDUCTION: This exam was performed according to the departmental dose-optimization program which includes automated exposure control, adjustment of the mA and/or kV according to patient size and/or use of iterative reconstruction technique. COMPARISON:  None Available. FINDINGS: CT HEAD FINDINGS Brain: No evidence of acute infarction, hemorrhage, hydrocephalus, extra-axial collection or mass lesion/mass effect. Mild chronic microvascular ischemic  change. Vascular: No hyperdense vessel or unexpected calcification. Skull: Nonspecific lucent lesion in the right frontal calvarium, unchanged compared to 09/01/2021. The inner and outer tables of the skull are intact. No acute fracture. Sinuses/Orbits: No middle ear or mastoid effusion. Paranasal sinuses are notable for mucosal thickening in the bilateral ethmoid sinuses. Bilateral lens replacement. Orbits are otherwise unremarkable. Other: None. CT CERVICAL SPINE FINDINGS Alignment: Trace retrolisthesis of C2 on C3. Multilevel degenerative endplate changes, most notably in the upper cervical spine. Skull base and vertebrae: No acute fracture. No primary bone lesion or focal pathologic process. Soft tissues and spinal canal: No prevertebral fluid or swelling. No visible canal hematoma. Disc levels: No evidence of high-grade spinal canal stenosis Upper chest: Negative. Other: None IMPRESSION: 1. No acute intracranial abnormality. 2. No acute fracture or traumatic malalignment of the cervical spine. 3. Nonspecific lucent lesion in the right frontal calvarium, unchanged compared to 09/01/2021 Electronically Signed   By: Lorenza Cambridge M.D.   On: 07/21/2022 15:14   DG Pelvis Portable  Result Date: 07/21/2022 CLINICAL DATA:  Fall last night.  Bilateral leg pain/weakness. EXAM: PORTABLE PELVIS 1-2 VIEWS COMPARISON:  Radiographs 12/09/2021.  Pelvic CT 08/25/2021. FINDINGS: Status post right total hip arthroplasty the hardware appears intact, without evidence of loosening. There is no evidence of acute fracture or dislocation. No significant arthropathy of the left hip or sacroiliac joints. The soft tissues appear unremarkable. IMPRESSION: No evidence of acute fracture or dislocation. Intact right total hip arthroplasty. Electronically Signed   By: Carey Bullocks M.D.   On: 07/21/2022 14:40   DG Knee Complete 4 Views Right  Result Date: 07/21/2022 CLINICAL DATA:  Bilateral leg pain and weakness since falling last  night. EXAM: RIGHT KNEE - COMPLETE 4+ VIEW COMPARISON:  Radiographs 01/05/2016. FINDINGS: The bones appear mildly demineralized. No evidence of acute fracture, dislocation or significant joint effusion. The joint spaces are preserved. There is a stable broad-based osseous excrescence from the lateral aspect of the distal femoral metadiaphysis, consistent with an osteochondroma. IMPRESSION: No evidence of acute right knee injury. Stable distal femoral osteochondroma. Electronically Signed   By: Carey Bullocks M.D.   On: 07/21/2022 14:39   DG Chest Port 1 View  Result Date: 07/21/2022 CLINICAL DATA:  Fall last night. EXAM: PORTABLE CHEST 1 VIEW COMPARISON:  Radiographs 05/25/2022 and 02/08/2022.  CT 06/30/2022. FINDINGS: 1401 hours. Right IJ Port-A-Cath extends to the mid SVC level, stable. The heart size and mediastinal contours are stable. The lungs are clear. There is no pleural effusion or pneumothorax. No acute osseous findings are evident. Telemetry leads overlie the chest. IMPRESSION: No evidence of acute chest injury. Stable right IJ Port-A-Cath position. Electronically Signed   By: Carey Bullocks M.D.   On: 07/21/2022 14:37   CT Chest Wo Contrast  Result Date: 07/06/2022 CLINICAL DATA:  Larey Seat 3 weeks ago, right-sided chest pain, mild cough, history of myeloproliferative disorder EXAM: CT CHEST WITHOUT CONTRAST TECHNIQUE: Multidetector CT imaging of the chest was performed following the standard protocol without IV contrast. RADIATION DOSE REDUCTION: This  exam was performed according to the departmental dose-optimization program which includes automated exposure control, adjustment of the mA and/or kV according to patient size and/or use of iterative reconstruction technique. COMPARISON:  02/08/2022, 11/10/2021 FINDINGS: Cardiovascular: Unenhanced imaging of the heart is unremarkable without pericardial effusion. Calcification of the aortic valve. Normal caliber of the thoracic aorta. There is  atherosclerosis of the aorta and coronary vasculature. Right chest wall port via internal jugular approach, tip within the superior vena cava. Evaluation of the vascular lumen is limited without IV contrast. Mediastinum/Nodes: No enlarged mediastinal or axillary lymph nodes. Thyroid gland, trachea, and esophagus demonstrate no significant findings. Lungs/Pleura: Stable upper lobe predominant emphysema. No acute airspace disease, effusion, or pneumothorax. Upper Abdomen: Spleen is enlarged. No other acute upper abdominal findings. Musculoskeletal: There are subacute healing right lateral sixth and seventh rib fractures, consistent with history of trauma 3 weeks ago. Please correlate with site of patient's pain. No other acute bony abnormalities. Multilevel thoracic spondylosis again noted. Reconstructed images demonstrate no additional findings. IMPRESSION: 1. Subacute healing right lateral sixth and seventh rib fractures, consistent with history of trauma 3 weeks ago. Please correlate with site of patient's pain. 2. Otherwise no acute intrathoracic process. 3. Aortic Atherosclerosis (ICD10-I70.0) and Emphysema (ICD10-J43.9). 4. Splenomegaly. Electronically Signed   By: Sharlet Salina M.D.   On: 07/06/2022 13:55    Microbiology: Results for orders placed or performed during the hospital encounter of 07/21/22  Urine Culture (for pregnant, neutropenic or urologic patients or patients with an indwelling urinary catheter)     Status: None   Collection Time: 07/21/22  2:05 PM   Specimen: Urine, Clean Catch  Result Value Ref Range Status   Specimen Description   Final    URINE, CLEAN CATCH Performed at [facility], [address] [place], [place] [address]    Special Requests   Final    NONE Performed at [facility], [address], [place], [place] [address]    Culture   Final    NO GROWTH Performed at [facility], [address], [place], [place] [address]    Report Status 07/22/2022 FINAL   Final  SARS Coronavirus 2 by RT PCR (hospital order, performed in [facility] hospital lab) *cepheid single result test* Anterior Nasal Swab     Status: None   Collection Time: 07/21/22  6:23 PM   Specimen: Anterior Nasal Swab  Result Value Ref Range Status   SARS Coronavirus 2 by RT PCR NEGATIVE NEGATIVE Final    Comment: (NOTE) SARS-CoV-2 target nucleic acids are NOT DETECTED.  The SARS-CoV-2 RNA is generally detectable in upper and lower respiratory specimens during the acute phase of infection. The lowest concentration of SARS-CoV-2 viral copies this assay can detect is 250 copies / mL. A negative result does not preclude SARS-CoV-2 infection and should not be used as the sole basis for treatment or other patient management decisions.  A negative result may occur with improper specimen collection / handling, submission of specimen other than nasopharyngeal swab, presence of viral mutation(s) within the areas targeted by this assay, and inadequate number of viral copies (<250 copies / mL). A negative result must be combined with clinical observations, patient history, and epidemiological information.  Fact Sheet for Patients:   RoadLapTop.co.za  Fact Sheet for Healthcare Providers: http://kim-miller.com/  This test is not yet approved or  cleared by the [place] FDA and has been authorized for detection and/or diagnosis of SARS-CoV-2 by FDA under an Emergency Use  Authorization (EUA).  This EUA will remain in effect (meaning this test can be used) for the duration of the COVID-19 declaration under Section 564(b)(1) of the Act, 21 U.S.C. section 360bbb-3(b)(1), unless the authorization is terminated or revoked sooner.  Performed at [facility], [address], [place], [place] [address]   Culture, blood (Routine X 2) w Reflex to ID Panel     Status: None (Preliminary result)   Collection Time: 07/21/22  7:20 PM   Specimen: BLOOD   Result Value Ref Range Status   Specimen Description BLOOD BLOOD LEFT HAND  Final   Special Requests   Final    BOTTLES DRAWN AEROBIC AND ANAEROBIC Blood Culture adequate volume   Culture   Final    NO GROWTH 2 DAYS Performed at [facility], [address], [place], [place] [address]    Report Status PENDING  Incomplete  Culture, blood (Routine X 2) w Reflex to ID Panel     Status: None (Preliminary result)   Collection Time: 07/21/22  7:20 PM   Specimen: BLOOD  Result Value Ref Range Status   Specimen Description BLOOD BLOOD RIGHT HAND  Final   Special Requests   Final    BOTTLES DRAWN AEROBIC AND ANAEROBIC Blood Culture adequate volume   Culture   Final    NO GROWTH 2 DAYS Performed at [facility], [address] [place], [place] [address]    Report Status PENDING  Incomplete   *Note: Due to a large number of results and/or encounters for the requested time period, some results have not been displayed. A complete set of results can be found in Results Review.    Labs: CBC: Recent Labs  Lab 07/19/22 1419 07/21/22 1350 07/22/22 0445 07/23/22 0425  WBC 38.2* 52.7* 72.8* 49.7*  NEUTROABS 22.5* 41.6*  --  38.3*  HGB 7.9* 9.0* 9.7* 7.4*  HCT 25.7* 28.2* 30.6* 23.3*  MCV 98.1 95.6 97.5 96.7  PLT 130* 170 173 101*   Basic Metabolic Panel: Recent Labs  Lab 07/19/22 1419 07/21/22 1350 07/21/22 1420 07/21/22 1643 07/22/22 0445 07/23/22 0425 07/23/22 1423  NA 137 140  --   --  136 132* 130*  K 4.6 2.5*  --   --  5.4* 5.2* 4.3  CL 108 122*  --   --  110 110 107  CO2 21* 13*  --   --  19* 20* 17*  GLUCOSE 100* 66*  --   --  109* 98 96  BUN 39* 25*  --   --  26* 24* 26*  CREATININE 1.73* 0.92  --   --  1.42* 1.48* 1.57*  CALCIUM 8.5* 5.2*  --   --  9.1 8.3* 8.3*  MG 2.0  --  1.8  --   --  1.5*  --   PHOS  --   --   --  3.3  --   --   --    Liver Function Tests: Recent Labs  Lab 07/19/22 1419 07/21/22 1350 07/23/22 0425  AST 27 22 33  ALT 17 14 26   ALKPHOS  128* 74 103  BILITOT 0.6 0.4 1.1  PROT 7.7 5.1* 6.6  ALBUMIN 3.6 2.5* 3.1*   CBG: Recent Labs  Lab 07/21/22 1620  GLUCAP 102*    Discharge time spent: greater than 30 minutes.  Signed: Catarina Hartshorn, MD [facility] 07/23/2022

## 2022-07-23 NOTE — TOC Transition Note (Signed)
Transition of Care [facility]) - CM/SW Discharge Note   Patient Details  Name: Orilla Rogel MRN: [id number] Date of Birth: 1946/02/05  Transition of Care [facility]) CM/SW Contact:  Catalina Gravel, LCSW Phone Number: 07/23/2022, 5:04 PM   Clinical Narrative:    CSW contacted [facility] as pt ready for DC.  Also verified information in AVS.  No other TOC needs.    Final next level of care: Home w Home Health Services Barriers to Discharge: No Barriers Identified   Patient Goals and CMS Choice CMS Medicare.gov Compare Post Acute Care list provided to:: Patient Choice offered to / list presented to : Patient  Discharge Placement                         Discharge Plan and Services Additional resources added to the After Visit Summary for                                       Social Determinants of Health (SDOH) Interventions SDOH Screenings   Food Insecurity: No Food Insecurity (07/22/2022)  Housing: Low Risk  (07/22/2022)  Transportation Needs: No Transportation Needs (07/22/2022)  Utilities: Not At Risk (07/22/2022)  Alcohol Screen: Low Risk  (12/18/2019)  Depression (PHQ2-9): Low Risk  (04/20/2022)  Financial Resource Strain: Low Risk  (12/28/2020)  Physical Activity: Inactive (04/23/2021)  Social Connections: Moderately Integrated (12/28/2020)  Stress: Stress Concern Present (04/23/2021)  Tobacco Use: Medium Risk (07/21/2022)     Readmission Risk Interventions    07/22/2022    2:40 PM 02/09/2022   11:30 AM 11/12/2021    1:49 PM  Readmission Risk Prevention Plan  Transportation Screening Complete Complete Complete  PCP or Specialist Appt within 3-5 Days   Complete  HRI or Home Care Consult   Complete  Social Work Consult for Recovery Care Planning/Counseling   Complete  Palliative Care Screening   Complete  Medication Review [employer]) Complete Complete Complete  PCP or Specialist appointment within 3-5 days of discharge Not Complete    HRI or Home  Care Consult Complete Complete   SW Recovery Care/Counseling Consult Complete Complete   Palliative Care Screening Not Applicable Not Applicable   Skilled Nursing Facility Not Applicable Complete

## 2022-07-24 LAB — CULTURE, BLOOD (ROUTINE X 2)

## 2022-07-25 ENCOUNTER — Telehealth: Payer: Self-pay

## 2022-07-25 ENCOUNTER — Ambulatory Visit: Payer: Self-pay

## 2022-07-25 DIAGNOSIS — G709 Myoneural disorder, unspecified: Secondary | ICD-10-CM | POA: Diagnosis not present

## 2022-07-25 DIAGNOSIS — N183 Chronic kidney disease, stage 3 unspecified: Secondary | ICD-10-CM | POA: Diagnosis not present

## 2022-07-25 DIAGNOSIS — C946 Myelodysplastic disease, not classified: Secondary | ICD-10-CM | POA: Diagnosis not present

## 2022-07-25 DIAGNOSIS — D631 Anemia in chronic kidney disease: Secondary | ICD-10-CM | POA: Diagnosis not present

## 2022-07-25 DIAGNOSIS — I129 Hypertensive chronic kidney disease with stage 1 through stage 4 chronic kidney disease, or unspecified chronic kidney disease: Secondary | ICD-10-CM | POA: Diagnosis not present

## 2022-07-25 DIAGNOSIS — M329 Systemic lupus erythematosus, unspecified: Secondary | ICD-10-CM | POA: Diagnosis not present

## 2022-07-25 LAB — CULTURE, BLOOD (ROUTINE X 2): Special Requests: ADEQUATE

## 2022-07-25 NOTE — Chronic Care Management (AMB) (Signed)
   07/25/2022  Renee Waters [birth year] [id number]   Reason for Encounter: Patient is not currently enrolled in the CCM program.  CCM enrollment status updated.   [facility]/Chronic Care Management [phone]

## 2022-07-25 NOTE — Transitions of Care (Post Inpatient/ED Visit) (Signed)
07/25/2022  Name: Renee Waters MRN: [id number] DOB: 01-29-1946  Today's TOC FU Call Status: Today's TOC FU Call Status:: Successful TOC FU Call Competed TOC FU Call Complete Date: 07/25/22  Transition Care Management Follow-up Telephone Call Date of Discharge: 07/23/22 Discharge [facility] Type of Discharge: Inpatient Admission Primary Inpatient Discharge Diagnosis:: Hypokalemia How have you been since you were released from the hospital?: Worse Any questions or concerns?: Yes Patient Questions/Concerns:: Patient notes her legs and hands are shakey.  She notes she felt better when she got home from the hospital, but is now not feeling well.  She has not had any falls. Patient Questions/Concerns Addressed: Notified Provider of Patient Questions/Concerns  Items Reviewed: Did you receive and understand the discharge instructions provided?: Yes Medications obtained,verified, and reconciled?: Yes (Medications Reviewed) Medications Not Reviewed Reasons:: Other: (Patient was not feeling well, this writer got off the phone with her and contacted her PCP) Any new allergies since your discharge?: No Dietary orders reviewed?: No Do you have support at home?: Yes People in Home: spouse Name of Support/Comfort Primary Source: [name]  Medications Reviewed Today: Medications Reviewed Today     Reviewed by Jodelle Gross, RN (Case Manager) on 07/25/22 at 1522  Med List Status: <None>   Medication Order Taking? Sig Documenting Provider Last Dose Status Informant  acyclovir (ZOVIRAX) 800 MG tablet [id number] No Take 1 tablet (800 mg total) by mouth 5 (five) times daily as needed. Cleora Fleet, MD Unknown Active Self  Allopurinol 200 MG TABS [id number] Yes Take 200 mg by mouth daily. [name], MD Taking Active Self  cetirizine (ZYRTEC) 10 MG tablet [id number] No Take 1 tablet by mouth once daily Kerri Perches, MD Unknown Active Self  cholecalciferol  (CHOLECALCIFEROL) 25 MCG tablet [id number] Yes Take 1 tablet (1,000 Units total) by mouth daily. Catarina Hartshorn, MD Taking Active   EPINEPHrine 0.3 mg/0.3 mL IJ SOAJ injection [id number] No INJECT 0.3 MLS INTO MUSCLE ONCE AS NEEDED Gardenia Phlegm, MD Unknown Active Self  furosemide (LASIX) 20 MG tablet [id number] Yes TAKE 1 TABLET BY MOUTH ONCE DAILY AS NEEDED [name], MD Taking Active Self  hydrOXYzine (ATARAX) 50 MG tablet [id number] Yes Take 1 tablet (50 mg total) by mouth 3 (three) times daily as needed. [name], MD Taking Active Self  lidocaine-prilocaine (EMLA) cream [id number] No Apply 1 Application topically as needed (Access to port and pain). [name], MD Unknown Active Self  metoprolol tartrate (LOPRESSOR) 25 MG tablet [id number] Yes Take 1 tablet (25 mg total) by mouth 2 (two) times daily. Take one and a half tablets twice daily Tat, David, MD Taking Active   mirtazapine (REMERON) 15 MG tablet [id number] Yes Take 1 tablet (15 mg total) by mouth at bedtime. Kerri Perches, MD Taking Active Self  momelotinib dihydrochloride [employer]) 150 MG tablet [id number] Yes Take 1 tablet (150 mg total) by mouth daily. [name], MD Taking Active Self  Multiple Vitamin (MULTIVITAMIN WITH MINERALS) TABS tablet [id number]  Take 1 tablet by mouth daily. [provider]  Active Self  ondansetron (ZOFRAN-ODT) 8 MG disintegrating tablet [id number] No Take 1 tablet (8 mg total) by mouth every 8 (eight) hours as needed for nausea or vomiting. [name], MD Unknown Active Self  oxyCODONE (ROXICODONE) 15 MG immediate release tablet [id number] No Take 15 mg by mouth every 6 (six) hours as needed. [provider] Unknown Active Self  pantoprazole (PROTONIX) 40 MG tablet [id number] Yes TAKE ONE TABLET BY MOUTH  30 TO 60 MINUTES BEFORE YOUR FIRST AND LAST MEALS OF THE DAY Nyoka Cowden, MD Taking Active Self  potassium chloride (KLOR-CON) 10 MEQ tablet  [id number]  Take 1 tablet by mouth once daily [name], MD  Active Self, Child  pregabalin (LYRICA) 75 MG capsule [id number]  Take 1 capsule (75 mg total) by mouth 2 (two) times daily. [name], MD  Active Self  prochlorperazine (COMPAZINE) 10 MG tablet [id number]  Take 1 tablet (10 mg total) by mouth every 6 (six) hours as needed for nausea or vomiting. [name], MD  Active Self, Child  temazepam (RESTORIL) 30 MG capsule [id number]  Take 1 capsule (30 mg total) by mouth at bedtime as needed for sleep. Kerri Perches, MD  Active Self  tiZANidine (ZANAFLEX) 4 MG tablet [id number]  Take 1 tablet (4 mg total) by mouth every 8 (eight) hours as needed for muscle spasms. Kerri Perches, MD  Active Self  Med List Note Lorriane Shire, CPhT 02/22/22 1312): [place] filled at [facility] and Equipment/Supplies: Were Home Health Services Ordered?: Yes Name of Home Health Agency:: [facility] Any new equipment or medical supplies ordered?: No  Functional Questionnaire: Do you need assistance with bathing/showering or dressing?: Yes Do you need assistance with meal preparation?: Yes Do you need assistance with eating?: No Do you have difficulty maintaining continence: No Do you need assistance with getting out of bed/getting out of a chair/moving?: Yes Do you have difficulty managing or taking your medications?: Yes  Follow up appointments reviewed: PCP Follow-up appointment confirmed?: Yes Date of PCP follow-up appointment?: 08/02/22 Follow-up Provider: Dr. Lodema Hong Specialist [facility] Follow-up appointment confirmed?: Yes Date of Specialist follow-up appointment?: 08/16/22 Follow-Up Specialty Provider:: Dr. Ellin Saba Do you need transportation to your follow-up appointment?: No Do you understand care options if your condition(s) worsen?: Yes-patient verbalized understanding TOC Interventions Today    Flowsheet  Row Most Recent Value  TOC Interventions   TOC Interventions Discussed/Reviewed TOC Interventions Reviewed, Contacted provider for patient needs, TOC Interventions Discussed       Jodelle Gross, RN, BSN, CCM Care Management Coordinator [facility]

## 2022-07-26 ENCOUNTER — Encounter (HOSPITAL_COMMUNITY): Payer: Self-pay | Admitting: Emergency Medicine

## 2022-07-26 ENCOUNTER — Other Ambulatory Visit: Payer: Self-pay

## 2022-07-26 ENCOUNTER — Inpatient Hospital Stay (HOSPITAL_COMMUNITY)
Admission: EM | Admit: 2022-07-26 | Discharge: 2022-07-29 | DRG: 683 | Disposition: A | Discharge status: Home Health | Payer: Medicare Other | Attending: Family Medicine | Admitting: Family Medicine

## 2022-07-26 ENCOUNTER — Telehealth: Payer: Self-pay | Admitting: Family Medicine

## 2022-07-26 ENCOUNTER — Emergency Department (HOSPITAL_COMMUNITY): Payer: Medicare Other

## 2022-07-26 ENCOUNTER — Ambulatory Visit: Payer: Medicare Other | Admitting: Family Medicine

## 2022-07-26 DIAGNOSIS — M329 Systemic lupus erythematosus, unspecified: Secondary | ICD-10-CM | POA: Diagnosis present

## 2022-07-26 DIAGNOSIS — D471 Chronic myeloproliferative disease: Secondary | ICD-10-CM

## 2022-07-26 DIAGNOSIS — N179 Acute kidney failure, unspecified: Principal | ICD-10-CM

## 2022-07-26 DIAGNOSIS — F1120 Opioid dependence, uncomplicated: Secondary | ICD-10-CM | POA: Diagnosis present

## 2022-07-26 DIAGNOSIS — Z9102 Food additives allergy status: Secondary | ICD-10-CM

## 2022-07-26 DIAGNOSIS — Z1152 Encounter for screening for COVID-19: Secondary | ICD-10-CM

## 2022-07-26 DIAGNOSIS — R7881 Bacteremia: Secondary | ICD-10-CM | POA: Diagnosis not present

## 2022-07-26 DIAGNOSIS — I10 Essential (primary) hypertension: Secondary | ICD-10-CM | POA: Diagnosis not present

## 2022-07-26 DIAGNOSIS — R4182 Altered mental status, unspecified: Secondary | ICD-10-CM

## 2022-07-26 DIAGNOSIS — G319 Degenerative disease of nervous system, unspecified: Secondary | ICD-10-CM | POA: Diagnosis not present

## 2022-07-26 DIAGNOSIS — Z91030 Bee allergy status: Secondary | ICD-10-CM

## 2022-07-26 DIAGNOSIS — I129 Hypertensive chronic kidney disease with stage 1 through stage 4 chronic kidney disease, or unspecified chronic kidney disease: Secondary | ICD-10-CM | POA: Diagnosis present

## 2022-07-26 DIAGNOSIS — Z96641 Presence of right artificial hip joint: Secondary | ICD-10-CM | POA: Diagnosis present

## 2022-07-26 DIAGNOSIS — Z8719 Personal history of other diseases of the digestive system: Secondary | ICD-10-CM

## 2022-07-26 DIAGNOSIS — R509 Fever, unspecified: Secondary | ICD-10-CM | POA: Diagnosis not present

## 2022-07-26 DIAGNOSIS — E860 Dehydration: Secondary | ICD-10-CM | POA: Diagnosis present

## 2022-07-26 DIAGNOSIS — Z9049 Acquired absence of other specified parts of digestive tract: Secondary | ICD-10-CM

## 2022-07-26 DIAGNOSIS — Z87891 Personal history of nicotine dependence: Secondary | ICD-10-CM

## 2022-07-26 DIAGNOSIS — Z8249 Family history of ischemic heart disease and other diseases of the circulatory system: Secondary | ICD-10-CM

## 2022-07-26 DIAGNOSIS — N1832 Chronic kidney disease, stage 3b: Secondary | ICD-10-CM

## 2022-07-26 DIAGNOSIS — D849 Immunodeficiency, unspecified: Secondary | ICD-10-CM | POA: Diagnosis present

## 2022-07-26 DIAGNOSIS — K2270 Barrett's esophagus without dysplasia: Secondary | ICD-10-CM | POA: Diagnosis present

## 2022-07-26 DIAGNOSIS — R531 Weakness: Secondary | ICD-10-CM | POA: Diagnosis not present

## 2022-07-26 DIAGNOSIS — Z885 Allergy status to narcotic agent status: Secondary | ICD-10-CM

## 2022-07-26 DIAGNOSIS — Z79899 Other long term (current) drug therapy: Secondary | ICD-10-CM

## 2022-07-26 DIAGNOSIS — N189 Chronic kidney disease, unspecified: Secondary | ICD-10-CM | POA: Diagnosis present

## 2022-07-26 DIAGNOSIS — Z90710 Acquired absence of both cervix and uterus: Secondary | ICD-10-CM

## 2022-07-26 DIAGNOSIS — K219 Gastro-esophageal reflux disease without esophagitis: Secondary | ICD-10-CM | POA: Diagnosis present

## 2022-07-26 DIAGNOSIS — Z888 Allergy status to other drugs, medicaments and biological substances status: Secondary | ICD-10-CM

## 2022-07-26 DIAGNOSIS — N184 Chronic kidney disease, stage 4 (severe): Secondary | ICD-10-CM | POA: Diagnosis present

## 2022-07-26 DIAGNOSIS — J9811 Atelectasis: Secondary | ICD-10-CM | POA: Diagnosis not present

## 2022-07-26 LAB — DIFFERENTIAL
Abs Immature Granulocytes: 11.04 10*3/uL — ABNORMAL HIGH (ref 0.00–0.07)
Band Neutrophils: 8 %
Basophils Absolute: 0 10*3/uL (ref 0.0–0.1)
Basophils Relative: 0 %
Eosinophils Absolute: 0 10*3/uL (ref 0.0–0.5)
Eosinophils Relative: 0 %
Lymphocytes Relative: 7 %
Lymphs Abs: 3.5 10*3/uL (ref 0.7–4.0)
Metamyelocytes Relative: 9 %
Monocytes Absolute: 3 10*3/uL — ABNORMAL HIGH (ref 0.1–1.0)
Monocytes Relative: 6 %
Myelocytes: 11 %
Neutro Abs: 32.6 10*3/uL — ABNORMAL HIGH (ref 1.7–7.7)
Neutrophils Relative %: 57 %
Promyelocytes Relative: 2 %
nRBC: 5 /100 WBC — ABNORMAL HIGH

## 2022-07-26 LAB — CBC
HCT: 25.6 % — ABNORMAL LOW (ref 36.0–46.0)
Hemoglobin: 8.1 g/dL — ABNORMAL LOW (ref 12.0–15.0)
MCH: 30.7 pg (ref 26.0–34.0)
MCHC: 31.6 g/dL (ref 30.0–36.0)
MCV: 97 fL (ref 80.0–100.0)
Platelets: 109 10*3/uL — ABNORMAL LOW (ref 150–400)
RBC: 2.64 MIL/uL — ABNORMAL LOW (ref 3.87–5.11)
RDW: 21.6 % — ABNORMAL HIGH (ref 11.5–15.5)
WBC: 50.2 10*3/uL (ref 4.0–10.5)
nRBC: 5.5 % — ABNORMAL HIGH (ref 0.0–0.2)

## 2022-07-26 LAB — URINALYSIS, ROUTINE W REFLEX MICROSCOPIC
Bilirubin Urine: NEGATIVE
Glucose, UA: NEGATIVE mg/dL
Ketones, ur: NEGATIVE mg/dL
Leukocytes,Ua: NEGATIVE
Nitrite: NEGATIVE
Protein, ur: NEGATIVE mg/dL
Specific Gravity, Urine: 1.006 (ref 1.005–1.030)
pH: 5 (ref 5.0–8.0)

## 2022-07-26 LAB — COMPREHENSIVE METABOLIC PANEL
ALT: 30 U/L (ref 0–44)
AST: 47 U/L — ABNORMAL HIGH (ref 15–41)
Albumin: 3.7 g/dL (ref 3.5–5.0)
Alkaline Phosphatase: 115 U/L (ref 38–126)
Anion gap: 9 (ref 5–15)
BUN: 46 mg/dL — ABNORMAL HIGH (ref 8–23)
CO2: 18 mmol/L — ABNORMAL LOW (ref 22–32)
Calcium: 8.4 mg/dL — ABNORMAL LOW (ref 8.9–10.3)
Chloride: 111 mmol/L (ref 98–111)
Creatinine, Ser: 2.15 mg/dL — ABNORMAL HIGH (ref 0.44–1.00)
GFR, Estimated: 23 mL/min — ABNORMAL LOW (ref >60)
Glucose, Bld: 87 mg/dL (ref 70–99)
Potassium: 4.1 mmol/L (ref 3.5–5.1)
Sodium: 138 mmol/L (ref 135–145)
Total Bilirubin: 0.7 mg/dL (ref 0.3–1.2)
Total Protein: 7.9 g/dL (ref 6.5–8.1)

## 2022-07-26 LAB — LACTIC ACID, PLASMA: Lactic Acid, Venous: 0.9 mmol/L (ref 0.5–1.9)

## 2022-07-26 LAB — CULTURE, BLOOD (ROUTINE X 2)

## 2022-07-26 LAB — CBG MONITORING, ED: Glucose-Capillary: 104 mg/dL — ABNORMAL HIGH (ref 70–99)

## 2022-07-26 MED ORDER — ONDANSETRON HCL 4 MG PO TABS: 4.0000 mg | ORAL_TABLET | Freq: Four times a day (QID) | ORAL | Status: DC | PRN

## 2022-07-26 MED ORDER — METRONIDAZOLE 500 MG/100ML IV SOLN
500.0000 mg | Freq: Once | INTRAVENOUS | Status: AC
  Administered 2022-07-26: 500 mg via INTRAVENOUS
  Filled 2022-07-26: qty 100

## 2022-07-26 MED ORDER — ACETAMINOPHEN 650 MG RE SUPP: 650.0000 mg | Freq: Four times a day (QID) | RECTAL | Status: DC | PRN

## 2022-07-26 MED ORDER — SODIUM CHLORIDE 0.9 % IV SOLN: INTRAVENOUS | Status: DC

## 2022-07-26 MED ORDER — ONDANSETRON HCL 4 MG/2ML IJ SOLN: 4.0000 mg | Freq: Four times a day (QID) | INTRAMUSCULAR | Status: DC | PRN

## 2022-07-26 MED ORDER — MOMELOTINIB DIHYDROCHLORIDE 150 MG PO TABS
150.0000 mg | ORAL_TABLET | Freq: Every day | ORAL | Status: DC
  Administered 2022-07-27 – 2022-07-29 (×3): 150 mg via ORAL

## 2022-07-26 MED ORDER — POLYETHYLENE GLYCOL 3350 17 G PO PACK: 17.0000 g | PACK | Freq: Every day | ORAL | Status: DC | PRN

## 2022-07-26 MED ORDER — OXYCODONE HCL 5 MG PO TABS
15.0000 mg | ORAL_TABLET | Freq: Four times a day (QID) | ORAL | Status: DC | PRN
  Administered 2022-07-26 – 2022-07-29 (×8): 15 mg via ORAL
  Filled 2022-07-26 (×8): qty 3

## 2022-07-26 MED ORDER — SODIUM CHLORIDE 0.9 % IV BOLUS
500.0000 mL | Freq: Once | INTRAVENOUS | Status: AC
  Administered 2022-07-26: 500 mL via INTRAVENOUS

## 2022-07-26 MED ORDER — SODIUM CHLORIDE 0.9 % IV SOLN
2.0000 g | Freq: Once | INTRAVENOUS | Status: AC
  Administered 2022-07-26: 2 g via INTRAVENOUS
  Filled 2022-07-26: qty 12.5

## 2022-07-26 MED ORDER — HYDRALAZINE HCL 20 MG/ML IJ SOLN
5.0000 mg | INTRAMUSCULAR | Status: DC | PRN
  Administered 2022-07-27: 5 mg via INTRAVENOUS
  Filled 2022-07-26: qty 1

## 2022-07-26 MED ORDER — ACETAMINOPHEN 325 MG PO TABS: 650.0000 mg | ORAL_TABLET | Freq: Four times a day (QID) | ORAL | Status: DC | PRN

## 2022-07-26 MED ORDER — PANTOPRAZOLE SODIUM 40 MG PO TBEC
40.0000 mg | DELAYED_RELEASE_TABLET | Freq: Every day | ORAL | Status: DC
  Administered 2022-07-27 – 2022-07-29 (×3): 40 mg via ORAL
  Filled 2022-07-26 (×3): qty 1

## 2022-07-26 MED ORDER — METOPROLOL TARTRATE 25 MG PO TABS
25.0000 mg | ORAL_TABLET | Freq: Two times a day (BID) | ORAL | Status: DC
  Administered 2022-07-26 – 2022-07-28 (×4): 25 mg via ORAL
  Filled 2022-07-26 (×4): qty 1

## 2022-07-26 MED ORDER — HYDROXYZINE HCL 25 MG PO TABS
50.0000 mg | ORAL_TABLET | Freq: Three times a day (TID) | ORAL | Status: DC | PRN
  Administered 2022-07-27 – 2022-07-28 (×4): 50 mg via ORAL
  Filled 2022-07-26 (×4): qty 2

## 2022-07-26 MED ORDER — VANCOMYCIN HCL IN DEXTROSE 1-5 GM/200ML-% IV SOLN
1000.0000 mg | Freq: Once | INTRAVENOUS | Status: AC
  Administered 2022-07-26: 1000 mg via INTRAVENOUS
  Filled 2022-07-26: qty 200

## 2022-07-26 MED ORDER — ENOXAPARIN SODIUM 30 MG/0.3ML IJ SOSY
30.0000 mg | PREFILLED_SYRINGE | INTRAMUSCULAR | Status: DC
  Administered 2022-07-27 – 2022-07-29 (×3): 30 mg via SUBCUTANEOUS
  Filled 2022-07-26 (×3): qty 0.3

## 2022-07-26 NOTE — Progress Notes (Signed)
I was contacted by lab to be notified that blood cultures drawn on 07/21/22 became positive in one of 2 sets (anaerobic bottle only) with Gram Pos Rods.  As patient was afebrile and clinically stable at time of d/c and blood cultures took 5 days to become positive and only in one of two sets, this represents a contaminant--not true bacteremia --suspect this GPR is likely corynebacterium, diphtheroids --no need for patient to be re-admitted at this time for IV abx.  DTat

## 2022-07-26 NOTE — Assessment & Plan Note (Addendum)
Follows with Dr. Ellin Saba.  WBC 50.2 today.  Chronically elevated.  On therapy with Momelotinib. - Resume

## 2022-07-26 NOTE — H&P (Signed)
History and Physical    Renee Waters ZOX:[id number] DOB: 09-08-1945 DOA: 07/26/2022  PCP: Kerri Perches, MD   Patient coming from: Home   I have personally briefly reviewed patient's old medical records in [facility]  Chief Complaint: Weakness  HPI: Renee Waters is a 77 y.o. female with medical history significant for CKD 3, hypertension, depression, lupus, HSV-2. Patient presented to the ED with complaints of generalized weakness.    She was just discharged after hospitalization 6/13 to 6/15 for fever of unknown etiology- 100.4, electrolyte abnormalities, hypokalemia, hypomagnesemia, hypocalcemia.  She was treated with empiric Vanco and cefepime, with negative cultures she was discharged, chest x-ray was clear, UA negative for pyuria and COVID was negative.  Antibiotics were DC'd on discharge. Today, patient was called about positive blood cultures -for gram-positive rods-cultures now positive today, in 1 anaerobic bottle that was drawn 6/13.   She reports since getting home, she has been generally weak and unable to ambulate, poor oral intake.  She reports persistent cough, no difficulty breathing, no vomiting or loose stools, no chest pain.  No fevers.  ED Course: Blood pressure elevated 140s to 180s.  Tmax 99.2.  Heart rates 50s to 70s.  O2 sats greater than 94% on room air.  Chronic leukocytosis, 50.2 today.  Creatinine elevated at 2.15.  Lactic acid 0.9. Chest x-ray with chronic changes. MRI brain with no acute reversible specific cause for symptoms.  Shows generalized atrophy. IV vancomycin cefepime and metronidazole started.  bolus given hospitalist admit for AKI and positive blood cultures.  Review of Systems: As per HPI all other systems reviewed and negative.  Past Medical History:  Diagnosis Date   Acute cholangitis    Acute respiratory failure with hypoxia (HCC) 09/01/2021   Allergy    Anemia    Anxiety    Arthritis    Phreesia 03/18/2020    Barrett's esophagus    Cataract    Chronic back pain    Chronic neck pain    CKD (chronic kidney disease) stage 3, GFR 30-59 ml/min (HCC) 10/29/2020   Depression    Genital herpes    GERD (gastroesophageal reflux disease)    H/O degenerative disc disease    History of blood transfusion    Hypertension    Hypoxia 12/01/2021   Insomnia    Lupus (systemic lupus erythematosus) (HCC)    Neuromuscular disorder (HCC)    Osteoarthritis    S/P colonoscopy 07/2003   normal, no polyps   S/P endoscopy June 2005, Oct 2009   2005: short-segment Barrett's, 2009: short-segment Barrett's   Upper respiratory tract infection 07/09/2020   UTI (lower urinary tract infection) 11/2012    Past Surgical History:  Procedure Laterality Date   ABDOMINAL HYSTERECTOMY     BACK SURGERY     BIOPSY N/A 03/20/2014   Procedure: BIOPSY;  Surgeon: Corbin Ade, MD;  Location: [facility] ORS;  Service: Endoscopy;  Laterality: N/A;   BIOPSY  09/14/2015   Procedure: BIOPSY;  Surgeon: Corbin Ade, MD;  Location: [facility] ENDO SUITE;  Service: Endoscopy;;  esophageal and gastric   BIOPSY  07/23/2019   Procedure: BIOPSY;  Surgeon: Corbin Ade, MD;  Location: [facility] END[facility] SUITE;  Service: Endoscopy;;  esophageal    CARPAL TUNNEL RELEASE Left 2013   cervical disectomy  2002   CESAREAN SECTION N/A    Phreesia 03/18/2020   CHOLECYSTECTOMY     with lysis of adhesions for sbo; "ruptured gallbladder".   COLONOSCOPY  11/09/2011   RMR: Melanosis coli   COLONOSCOPY WITH PROPOFOL N/A 09/14/2015   Dr. Jena Gauss: diverticulosis, 3mm TA removed. next TCS 09/2020.    COLONOSCOPY WITH PROPOFOL N/A 04/30/2020   Procedure: COLONOSCOPY WITH PROPOFOL;  Surgeon: Corbin Ade, MD;  Location: [facility];  Service: Endoscopy;  Laterality: N/A;  PM (ASA 3)   DENTAL SURGERY  11/2015   multiple tooth extraction   ESOPHAGOGASTRODUODENOSCOPY  11/29/2007   salmon-colored  tongue   longest stable at  3 cm, distal esophagus as described previously status  post biopsy/ Hiatal hernia, otherwise normal stomach D1 and D2   ESOPHAGOGASTRODUODENOSCOPY  01/06/11   short segment Barrett's esophagus s/p bx/Hiatal hernia   ESOPHAGOGASTRODUODENOSCOPY (EGD) WITH PROPOFOL N/A 03/20/2014   [id number]:[id number] distal esophagus short segment barrett's, bx with no dysplasia. next egd in 03/2017   ESOPHAGOGASTRODUODENOSCOPY (EGD) WITH PROPOFOL N/A 09/14/2015   Dr. Jena Gauss: Barrett's without dysplasia, gastritis benign bx, hiatal hernia. next EGD 09/2018.   ESOPHAGOGASTRODUODENOSCOPY (EGD) WITH PROPOFOL N/A 07/23/2019   Procedure: ESOPHAGOGASTRODUODENOSCOPY (EGD) WITH PROPOFOL;  Surgeon: Corbin Ade, MD;  Location: [facility];  Service: Endoscopy;  Laterality: N/A;  3:00pm   EYE SURGERY N/A    Phreesia 03/18/2020   HERNIA REPAIR Right 07/2010   Dr. Arlean Hopping   IR IMAGING GUIDED PORT INSERTION  02/09/2021   JOINT REPLACEMENT     LAPAROSCOPIC CHOLECYSTECTOMY  2017   at [facility]   POLYPECTOMY  09/14/2015   Procedure: POLYPECTOMY;  Surgeon: Corbin Ade, MD;  Location: [facility];  Service: Endoscopy;;  ascending colon   right hip replacement  07/2010   went back in sept 2012 to fix   SHOULDER ARTHROSCOPY  2008   left   SPINE SURGERY N/A    Phreesia 03/18/2020   TOTAL HIP REVISION Right 12/17/2012   Procedure: RIGHT TOTAL HIP REVISION;  Surgeon: [name], MD;  Location: [facility]S;  Service: Orthopedics;  Laterality: Right;   WRIST SURGERY Right 2011   open reduction right wrist.     reports that she quit smoking about 19 years ago. Her smoking use included cigarettes. She has a 6.25 pack-year smoking history. She has never used smokeless tobacco. She reports that she does not drink alcohol and does not use drugs.  Allergies  Allergen Reactions   Bee Venom Swelling and Hives   Norvasc [Amlodipine] Other (See Comments)    Hair loss   Tylenol [Acetaminophen] Itching   Hydralazine Other (See Comments)    Hair loss   Red Dye Itching   Percocet  [Oxycodone-Acetaminophen] Rash and Other (See Comments)    Pt states, "this gives her a rash, but at home she takes oxycodone for pain relief"    Tyloxapol Itching and Rash    Family History  Problem Relation Age of Onset   Hypertension Mother    Stroke Mother    Colon cancer Neg Hx    Anesthesia problems Neg Hx    Hypotension Neg Hx    Malignant hyperthermia Neg Hx    Pseudochol deficiency Neg Hx    Gastric cancer Neg Hx    Esophageal cancer Neg Hx     Prior to Admission medications   Medication Sig Start Date End Date Taking? Authorizing Provider  acyclovir (ZOVIRAX) 800 MG tablet Take 1 tablet (800 mg total) by mouth 5 (five) times daily as needed. 02/10/22   Johnson, Clanford L, MD  Allopurinol 200 MG TABS Take 200 mg by mouth daily. 05/13/22  [name], MD  cetirizine (ZYRTEC) 10 MG tablet Take 1 tablet by mouth once daily 05/23/22   Kerri Perches, MD  cholecalciferol (CHOLECALCIFEROL) 25 MCG tablet Take 1 tablet (1,000 Units total) by mouth daily. 07/24/22   Catarina Hartshorn, MD  EPINEPHrine 0.3 mg/0.3 mL IJ SOAJ injection INJECT 0.3 MLS INTO MUSCLE ONCE AS NEEDED 01/11/22   Gardenia Phlegm, MD  furosemide (LASIX) 20 MG tablet TAKE 1 TABLET BY MOUTH ONCE DAILY AS NEEDED 01/04/22   [name], MD  hydrOXYzine (ATARAX) 50 MG tablet Take 1 tablet (50 mg total) by mouth 3 (three) times daily as needed. 07/11/22   [name], MD  lidocaine-prilocaine (EMLA) cream Apply 1 Application topically as needed (Access to port and pain). 11/15/21   [name], MD  metoprolol tartrate (LOPRESSOR) 25 MG tablet Take 1 tablet (25 mg total) by mouth 2 (two) times daily. Take one and a half tablets twice daily 07/23/22   Tat, Onalee Hua, MD  mirtazapine (REMERON) 15 MG tablet Take 1 tablet (15 mg total) by mouth at bedtime. 03/09/22   Kerri Perches, MD  momelotinib dihydrochloride (OJJAARA) 150 MG tablet Take 1 tablet (150 mg total) by mouth daily. 07/11/22   [name], MD  Multiple Vitamin (MULTIVITAMIN WITH MINERALS) TABS tablet Take 1 tablet by mouth daily.    [provider]  ondansetron (ZOFRAN-ODT) 8 MG disintegrating tablet Take 1 tablet (8 mg total) by mouth every 8 (eight) hours as needed for nausea or vomiting. 03/01/22   [name], MD  oxyCODONE (ROXICODONE) 15 MG immediate release tablet Take 15 mg by mouth every 6 (six) hours as needed. 01/19/22   [provider]  pantoprazole (PROTONIX) 40 MG tablet TAKE ONE TABLET BY MOUTH 30 TO 60 MINUTES BEFORE YOUR FIRST AND LAST MEALS OF THE DAY 07/15/22   Nyoka Cowden, MD  potassium chloride (KLOR-CON) 10 MEQ tablet Take 1 tablet by mouth once daily 12/29/21   [name], MD  pregabalin (LYRICA) 75 MG capsule Take 1 capsule (75 mg total) by mouth 2 (two) times daily. 07/11/22   [name], MD  prochlorperazine (COMPAZINE) 10 MG tablet Take 1 tablet (10 mg total) by mouth every 6 (six) hours as needed for nausea or vomiting. 01/04/22   [name], MD  temazepam (RESTORIL) 30 MG capsule Take 1 capsule (30 mg total) by mouth at bedtime as needed for sleep. 04/21/22   Kerri Perches, MD  tiZANidine (ZANAFLEX) 4 MG tablet Take 1 tablet (4 mg total) by mouth every 8 (eight) hours as needed for muscle spasms. 05/17/22   Kerri Perches, MD    Physical Exam: Vitals:   07/26/22 1800 07/26/22 1930 07/26/22 2008 07/26/22 2053  BP: (!) 182/71 (!) 148/106 (!) 173/77 (!) 180/73  Pulse:  70 67   Resp: 13 20 18    Temp:  98.7 F (37.1 C) 98.9 F (37.2 C)   TempSrc:  Oral Oral   SpO2:  96% 98%   Weight:      Height:        Constitutional: NAD, calm, comfortable Vitals:   07/26/22 1800 07/26/22 1930 07/26/22 2008 07/26/22 2053  BP: (!) 182/71 (!) 148/106 (!) 173/77 (!) 180/73  Pulse:  70 67   Resp: 13 20 18    Temp:  98.7 F (37.1 C) 98.9 F (37.2 C)   TempSrc:  Oral Oral   SpO2:  96% 98%   Weight:      Height:  Eyes: PERRL, lids  and conjunctivae normal ENMT: Mucous membranes are moist.   Neck: normal, supple, no masses, no thyromegaly Respiratory: clear to auscultation bilaterally, no wheezing, no crackles. Normal respiratory effort. No accessory muscle use.  Cardiovascular: Regular rate and rhythm, no murmurs / rubs / gallops. No extremity edema.  Abdomen: no tenderness, no masses palpated. No hepatosplenomegaly. Bowel sounds positive.  Musculoskeletal: no clubbing / cyanosis. No joint deformity upper and lower extremities. Skin: no rashes, lesions, ulcers. No induration Neurologic: Facial asymmetry, 4+5 strength bilateral upper extremity, 4/5 strength of bilateral lower extremity, speech clear and fluent without aphasia Psychiatric: Normal judgment and insight. Alert and oriented x 3. Normal mood.   Labs on Admission: I have personally reviewed following labs and imaging studies  CBC: Recent Labs  Lab 07/21/22 1350 07/22/22 0445 07/23/22 0425 07/26/22 1647  WBC 52.7* 72.8* 49.7* 50.2*  NEUTROABS 41.6*  --  38.3* 32.6*  HGB 9.0* 9.7* 7.4* 8.1*  HCT 28.2* 30.6* 23.3* 25.6*  MCV 95.6 97.5 96.7 97.0  PLT 170 173 101* 109*   Basic Metabolic Panel: Recent Labs  Lab 07/21/22 1350 07/21/22 1420 07/21/22 1643 07/22/22 0445 07/23/22 0425 07/23/22 1423 07/26/22 1647  NA 140  --   --  136 132* 130* 138  K 2.5*  --   --  5.4* 5.2* 4.3 4.1  CL 122*  --   --  110 110 107 111  CO2 13*  --   --  19* 20* 17* 18*  GLUCOSE 66*  --   --  109* 98 96 87  BUN 25*  --   --  26* 24* 26* 46*  CREATININE 0.92  --   --  1.42* 1.48* 1.57* 2.15*  CALCIUM 5.2*  --   --  9.1 8.3* 8.3* 8.4*  MG  --  1.8  --   --  1.5*  --   --   PHOS  --   --  3.3  --   --   --   --    GFR: Estimated Creatinine Clearance: 18.5 mL/min (A) (by C-G formula based on SCr of 2.15 mg/dL (H)). Liver Function Tests: Recent Labs  Lab 07/21/22 1350 07/23/22 0425 07/26/22 1647  AST 22 33 47*  ALT 14 26 30   ALKPHOS 74 103 115  BILITOT 0.4 1.1  0.7  PROT 5.1* 6.6 7.9  ALBUMIN 2.5* 3.1* 3.7   No results for input(s): "LIPASE", "AMYLASE" in the last 168 hours. Recent Labs  Lab 07/21/22 1420  AMMONIA 17   Coagulation Profile: Recent Labs  Lab 07/21/22 1350  INR 1.6*   Cardiac Enzymes: Recent Labs  Lab 07/22/22 1647  CKTOTAL 146   CBG: Recent Labs  Lab 07/21/22 1620 07/26/22 1459  GLUCAP 102* 104*   Radiological Exams on Admission: MR Brain Wo Contrast (neuro protocol)  Result Date: 07/26/2022 CLINICAL DATA:  Mental status change with unknown cause EXAM: MRI HEAD WITHOUT CONTRAST TECHNIQUE: Multiplanar, multiecho pulse sequences of the brain and surrounding structures were obtained without intravenous contrast. COMPARISON:  Head CT from 5 days ago FINDINGS: Brain: No acute infarction, hemorrhage, hydrocephalus, extra-axial collection or mass lesion. Generalized atrophy without specific pattern. Mild for age chronic small vessel ischemia in the cerebral white matter. Vascular: Normal flow voids Skull and upper cervical spine: Diffusely T1 hypointense marrow with prominent diffusion signal. There is sclerosis by CT with vague lucency in the right frontal bone that is not detected on this scan. History of myeloproliferative disease per  the chart. Sinuses/Orbits: Bilateral cataract resection. IMPRESSION: No acute, reversible, or specific cause for symptoms. Generalized atrophy. Electronically Signed   By: Tiburcio Pea M.D.   On: 07/26/2022 18:18   DG Chest Port 1 View  Result Date: 07/26/2022 CLINICAL DATA:  Weakness EXAM: PORTABLE CHEST 1 VIEW COMPARISON:  X-ray 07/21/2022 FINDINGS: Normal cardiopericardial silhouette. Calcified aorta. No pneumothorax, effusion or consolidation. Mild basilar atelectasis. No edema. Underlying chronic lung changes. Right IJ chest port with tip along the central SVC above the right atrium. Overlapping cardiac leads. IMPRESSION: Chronic changes.  Chest port. Slight basilar atelectasis.  Electronically Signed   By: Karen Kays M.D.   On: 07/26/2022 16:13    EKG: Independently reviewed.  Sinus rhythm, rate 60, QTc 434, no significant change from prior.  Assessment/Plan Principal Problem:   Acute-on-chronic kidney injury (HCC) Active Problems:   Fever   MPN (myeloproliferative neoplasm) (HCC)   Generalized weakness   Essential hypertension   Assessment and Plan: * Acute-on-chronic kidney injury (HCC) Cr elevated at 2.15, baseline 1.4-1.5.  CKD stage IIIb.  Likely Prerenal. -500 mill bolus given, continue N/s 100cc/hr x 20hrs -Hold Lasix 20 mg  Fever Fever in the setting of myeloproliferative neoplasm. Temp - 100.4- 6/13. Was treated empirically with IV vancomycin and cefepime.  Cultures today growing gram-positive rods.  Likely contaminant.  Blood cultures positive in 1 bottle after 5 days.  Tmax today 99.2. -IV Vanco cefepime and metronidazole started in ED, hold off on further antibiotics for now -Follow-up repeat blood cultures ordered in the ED - F/u speciation of blood cultures 6/13  Generalized weakness Generalized weakness, in the setting of myeloproliferative neoplasm.  MRI Brain negative for acute abnormality. -May benefit from rehab, PT eval  MPN (myeloproliferative neoplasm) (HCC) Follows with Dr. Ellin Saba.  WBC 50.2 today.  Chronically elevated.  On therapy with Momelotinib. - Resume  Essential hypertension Blood Pressure elevated up to 180s. -Resume metoprolol -May need oral agents for blood pressure control - IV hydralazine 5 mg every 4 hours as needed for systolic greater than 180    DVT prophylaxis: Lovenox Code Status: FULL-confirmed with patient at bedside Family Communication: None at bedside Disposition Plan: ~ 2 days Consults called: None Admission status:  Obs Med surg  Author: Onnie Boer, MD 07/26/2022 10:08 PM  For on call review [email or web address].

## 2022-07-26 NOTE — Assessment & Plan Note (Signed)
Cr elevated at 2.15, baseline 1.4-1.5.  CKD stage IIIb.  Likely Prerenal. -500 mill bolus given, continue N/s 100cc/hr x 20hrs -Hold Lasix 20 mg

## 2022-07-26 NOTE — ED Triage Notes (Signed)
Pt via POV c/o generalized weakness continued after recent hospitalization, DC on 6/15. Symptoms first started on 6/12 and include weakness of all extremities, decreased stamina, cough, urinary urgency and leaking. Pt is being treated for MDS cancer, most recent oral dose this morning. Pt denies pain.

## 2022-07-26 NOTE — Telephone Encounter (Signed)
[id number] 

## 2022-07-26 NOTE — ED Notes (Addendum)
ED TO INPATIENT HANDOFF REPORT  ED Nurse Name and Phone #: [phone] [facility]  S Name/Age/Gender Renee Waters 77 y.o. female Room/Bed: APA05/APA05  Code Status   Code Status: Prior  Home/SNF/Other Home Patient oriented to: self, place, time, and situation Is this baseline? Yes   Triage Complete: Triage complete  Chief Complaint AKI (acute kidney injury) (HCC) [N17.9]  Triage Note Pt via POV c/o generalized weakness continued after recent hospitalization, DC on 6/15. Symptoms first started on 6/12 and include weakness of all extremities, decreased stamina, cough, urinary urgency and leaking. Pt is being treated for MDS cancer, most recent oral dose this morning. Pt denies pain.    Allergies Allergies  Allergen Reactions   Bee Venom Swelling and Hives   Norvasc [Amlodipine] Other (See Comments)    Hair loss   Tylenol [Acetaminophen] Itching   Hydralazine Other (See Comments)    Hair loss   Red Dye Itching   Percocet [Oxycodone-Acetaminophen] Rash and Other (See Comments)    Pt states, "this gives her a rash, but at home she takes oxycodone for pain relief"    Tyloxapol Itching and Rash    Level of Care/Admitting Diagnosis ED Disposition     ED Disposition  Admit   Condition  --   Comment  Hospital Area: [facility] [[id number]]  Level of Care: Med-Surg [16]  Covid Evaluation: Asymptomatic - no recent exposure (last 10 days) testing not required  Diagnosis: AKI (acute kidney injury) [facility]) [[id number]]  Admitting Physician: Onnie Boer [phone]  Attending Physician: Onnie Boer [email or web address]          B Medical/Surgery History Past Medical History:  Diagnosis Date   Acute cholangitis    Acute respiratory failure with hypoxia (HCC) 09/01/2021   Allergy    Anemia    Anxiety    Arthritis    Phreesia 03/18/2020   Barrett's esophagus    Cataract    Chronic back pain    Chronic neck pain    CKD (chronic kidney disease) stage 3, GFR 30-59  ml/min (HCC) 10/29/2020   Depression    Genital herpes    GERD (gastroesophageal reflux disease)    H/O degenerative disc disease    History of blood transfusion    Hypertension    Hypoxia 12/01/2021   Insomnia    Lupus (systemic lupus erythematosus) (HCC)    Neuromuscular disorder (HCC)    Osteoarthritis    S/P colonoscopy 07/2003   normal, no polyps   S/P endoscopy June 2005, Oct 2009   2005: short-segment Barrett's, 2009: short-segment Barrett's   Upper respiratory tract infection 07/09/2020   UTI (lower urinary tract infection) 11/2012   Past Surgical History:  Procedure Laterality Date   ABDOMINAL HYSTERECTOMY     BACK SURGERY     BIOPSY N/A 03/20/2014   Procedure: BIOPSY;  Surgeon: Corbin Ade, MD;  Location: [facility];  Service: Endoscopy;  Laterality: N/A;   BIOPSY  09/14/2015   Procedure: BIOPSY;  Surgeon: Corbin Ade, MD;  Location: [facility];  Service: Endoscopy;;  esophageal and gastric   BIOPSY  07/23/2019   Procedure: BIOPSY;  Surgeon: Corbin Ade, MD;  Location: [facility];  Service: Endoscopy;;  esophageal    CARPAL TUNNEL RELEASE Left 2013   cervical disectomy  2002   CESAREAN SECTION N/A    Phreesia 03/18/2020   CHOLECYSTECTOMY     with lysis of adhesions for sbo; "ruptured gallbladder".   COLONOSCOPY  11/09/2011  RMR: Melanosis coli   COLONOSCOPY WITH PROPOFOL N/A 09/14/2015   Dr. Jena Gauss: diverticulosis, 3mm TA removed. next TCS 09/2020.    COLONOSCOPY WITH PROPOFOL N/A 04/30/2020   Procedure: COLONOSCOPY WITH PROPOFOL;  Surgeon: Corbin Ade, MD;  [facility];  Service: Endoscopy;  Laterality: N/A;  PM (ASA 3)   DENTAL SURGERY  11/2015   multiple tooth extraction   ESOPHAGOGASTRODUODENOSCOPY  11/29/2007   salmon-colored  tongue   longest stable at  3 cm, distal esophagus as described previously status post biopsy/ Hiatal hernia, otherwise normal stomach D1 and D2   ESOPHAGOGASTRODUODENOSCOPY  01/06/11   short segment Barrett's  esophagus s/p bx/Hiatal hernia   ESOPHAGOGASTRODUODENOSCOPY (EGD) WITH PROPOFOL N/A 03/20/2014   ZOX:WRUEAVWU distal esophagus short segment barrett's, bx with no dysplasia. next egd in 03/2017   ESOPHAGOGASTRODUODENOSCOPY (EGD) WITH PROPOFOL N/A 09/14/2015   Dr. Jena Gauss: Barrett's without dysplasia, gastritis benign bx, hiatal hernia. next EGD 09/2018.   ESOPHAGOGASTRODUODENOSCOPY (EGD) WITH PROPOFOL N/A 07/23/2019   Procedure: ESOPHAGOGASTRODUODENOSCOPY (EGD) WITH PROPOFOL;  Surgeon: Corbin Ade, MD;  [facility];  Service: Endoscopy;  Laterality: N/A;  3:00pm   EYE SURGERY N/A    Phreesia 03/18/2020   HERNIA REPAIR Right 07/2010   Dr. Arlean Hopping   IR IMAGING GUIDED PORT INSERTION  02/09/2021   JOINT REPLACEMENT     LAPAROSCOPIC CHOLECYSTECTOMY  2017   at [facility]   POLYPECTOMY  09/14/2015   Procedure: POLYPECTOMY;  Surgeon: Corbin Ade, MD;  Location[facility];  Service: Endoscopy;;  ascending colon   right hip replacement  07/2010   went back in sept 2012 to fix   SHOULDER ARTHROSCOPY  2008   left   SPINE SURGERY N/A    Phreesia 03/18/2020   TOTAL HIP REVISION Right 12/17/2012   Procedure: RIGHT TOTAL HIP REVISION;  Surgeon: [name], MD;  Location: [facility];  Service: Orthopedics;  Laterality: Right;   WRIST SURGERY Right 2011   open reduction right wrist.     A IV Location/Drains/Wounds Patient Lines/Drains/Airways Status     Active Line/Drains/Airways     Name Placement date Placement time Site Days   Implanted Port 02/09/21 Right Chest 02/09/21  1503  Chest  532            Intake/Output Last 24 hours  Intake/Output Summary (Last 24 hours) at 07/26/2022 1926 Last data filed at 07/26/2022 1920 Gross per 24 hour  Intake 600 ml  Output --  Net 600 ml    Labs/Imaging Results for orders placed or performed during the hospital encounter of 07/26/22 (from the past 48 hour(s))  CBG monitoring, ED     Status: Abnormal   Collection Time: 07/26/22  2:59  PM  Result Value Ref Range   Glucose-Capillary 104 (H) 70 - 99 mg/dL    Comment: Glucose reference range applies only to samples taken after fasting for at least 8 hours.  CBC     Status: Abnormal   Collection Time: 07/26/22  4:47 PM  Result Value Ref Range   WBC 50.2 (HH) 4.0 - 10.5 K/uL    Comment: REPEATED TO VERIFY THIS CRITICAL RESULT HAS VERIFIED AND BEEN CALLED TO DOSS, MICHAEL BY ASHLEY FRATTO ON 06 18 2024 AT 1711, AND HAS BEEN READ BACK.     RBC 2.64 (L) 3.87 - 5.11 MIL/uL   Hemoglobin 8.1 (L) 12.0 - 15.0 g/dL   HCT 98.1 (L) 19.1 - 47.8 %   MCV 97.0 80.0 -  100.0 fL   MCH 30.7 26.0 - 34.0 pg   MCHC 31.6 30.0 - 36.0 g/dL   RDW 16.1 (H) 09.6 - 04.5 %   Platelets 109 (L) 150 - 400 K/uL   nRBC 5.5 (H) 0.0 - 0.2 %    Comment: Performed at [facility], [address], [place], [place] [address]  Comprehensive metabolic panel     Status: Abnormal   Collection Time: 07/26/22  4:47 PM  Result Value Ref Range   Sodium 138 135 - 145 mmol/L   Potassium 4.1 3.5 - 5.1 mmol/L   Chloride 111 98 - 111 mmol/L   CO2 18 (L) 22 - 32 mmol/L   Glucose, Bld 87 70 - 99 mg/dL    Comment: Glucose reference range applies only to samples taken after fasting for at least 8 hours.   BUN 46 (H) 8 - 23 mg/dL   Creatinine, Ser 1.91 (H) 0.44 - 1.00 mg/dL   Calcium 8.4 (L) 8.9 - 10.3 mg/dL   Total Protein 7.9 6.5 - 8.1 g/dL   Albumin 3.7 3.5 - 5.0 g/dL   AST 47 (H) 15 - 41 U/L   ALT 30 0 - 44 U/L   Alkaline Phosphatase 115 38 - 126 U/L   Total Bilirubin 0.7 0.3 - 1.2 mg/dL   GFR, Estimated 23 (L) >60 mL/min    Comment: (NOTE) Calculated using the CKD-EPI Creatinine Equation (2021)    Anion gap 9 5 - 15    Comment: Performed at [facility], [address] [place], [place] [address]  Differential     Status: Abnormal   Collection Time: 07/26/22  4:47 PM  Result Value Ref Range   Neutrophils Relative % 57 %   Neutro Abs 32.6 (H) 1.7 - 7.7 K/uL   Band Neutrophils 8 %   Lymphocytes Relative 7 %    Lymphs Abs 3.5 0.7 - 4.0 K/uL   Monocytes Relative 6 %   Monocytes Absolute 3.0 (H) 0.1 - 1.0 K/uL   Eosinophils Relative 0 %   Eosinophils Absolute 0.0 0.0 - 0.5 K/uL   Basophils Relative 0 %   Basophils Absolute 0.0 0.0 - 0.1 K/uL   WBC Morphology Marked Left Shift (>5% metas, myelos and pros)     Comment: SMUDGE CELLS   Smear Review MORPHOLOGY UNREMARKABLE    nRBC 5 (H) 0 /100 WBC   Metamyelocytes Relative 9 %   Myelocytes 11 %   Promyelocytes Relative 2 %   Abs Immature Granulocytes 11.04 (H) 0.00 - 0.07 K/uL   Polychromasia PRESENT     Comment: Performed at [facility], [address] [place], [place] [address]  Lactic acid, plasma     Status: None   Collection Time: 07/26/22  6:02 PM  Result Value Ref Range   Lactic Acid, Venous 0.9 0.5 - 1.9 mmol/L    Comment: Performed at [facility], [address], [place], [place] [address]   *Note: Due to a large number of results and/or encounters for the requested time period, some results have not been displayed. A complete set of results can be found in Results Review.   MR Brain Wo Contrast (neuro protocol)  Result Date: 07/26/2022 CLINICAL DATA:  Mental status change with unknown cause EXAM: MRI HEAD WITHOUT CONTRAST TECHNIQUE: Multiplanar, multiecho pulse sequences of the brain and surrounding structures were obtained without intravenous contrast. COMPARISON:  Head CT from 5 days ago FINDINGS: Brain: No acute infarction, hemorrhage, hydrocephalus, extra-axial collection or mass lesion.  Generalized atrophy without specific pattern. Mild for age chronic small vessel ischemia in the cerebral white matter. Vascular: Normal flow voids Skull and upper cervical spine: Diffusely T1 hypointense marrow with prominent diffusion signal. There is sclerosis by CT with vague lucency in the right frontal bone that is not detected on this scan. History of myeloproliferative disease per the chart. Sinuses/Orbits: Bilateral cataract resection.  IMPRESSION: No acute, reversible, or specific cause for symptoms. Generalized atrophy. Electronically Signed   By: Tiburcio Pea M.D.   On: 07/26/2022 18:18   DG Chest Port 1 View  Result Date: 07/26/2022 CLINICAL DATA:  Weakness EXAM: PORTABLE CHEST 1 VIEW COMPARISON:  X-ray 07/21/2022 FINDINGS: Normal cardiopericardial silhouette. Calcified aorta. No pneumothorax, effusion or consolidation. Mild basilar atelectasis. No edema. Underlying chronic lung changes. Right IJ chest port with tip along the central SVC above the right atrium. Overlapping cardiac leads. IMPRESSION: Chronic changes.  Chest port. Slight basilar atelectasis. Electronically Signed   By: Karen Kays M.D.   On: 07/26/2022 16:13    Pending Labs Unresulted Labs (From admission, onward)     Start     Ordered   07/26/22 1711  Culture, blood (Routine X 2) w Reflex to ID Panel  BLOOD CULTURE X 2,   R (with STAT occurrences)      07/26/22 1711   07/26/22 1711  Lactic acid, plasma  Now then every 2 hours,   R (with STAT occurrences)      07/26/22 1711   07/26/22 1450  Urinalysis, Routine w reflex microscopic -Urine, Clean Catch  Once,   URGENT       Question Answer Comment  Specimen Source Urine, Clean Catch   Release to patient Immediate      07/26/22 1449            Vitals/Pain Today's Vitals   07/26/22 1452 07/26/22 1600 07/26/22 1700 07/26/22 1800  BP: (!) 152/60 (!) 171/68 (!) 174/73 (!) 182/71  Pulse: (!) 57 61 67   Resp: 16 12 14 13   Temp: 99.2 F (37.3 C)     TempSrc: Oral     SpO2: 95% 94% 96%   Weight:      Height:      PainSc:        Isolation Precautions No active isolations  Medications Medications  0.9 %  sodium chloride infusion ( Intravenous New Bag/Given 07/26/22 1649)  vancomycin (VANCOCIN) IVPB 1000 mg/200 mL premix (1,000 mg Intravenous New Bag/Given 07/26/22 1841)  ceFEPIme (MAXIPIME) 2 g in sodium chloride 0.9 % 100 mL IVPB (0 g Intravenous Stopped 07/26/22 1842)  metroNIDAZOLE  (FLAGYL) IVPB 500 mg (0 mg Intravenous Stopped 07/26/22 1920)  sodium chloride 0.9 % bolus 500 mL (0 mLs Intravenous Stopped 07/26/22 1919)    Mobility walks with device     R Recommendations: See Admitting Provider Note  Report given to:   Additional Notes: A&O x4, will stand and pivot to bedside commode. Port access by previous Char[employer] fundraiser, daughter intermittently at bedside, very helpful with pt. Blood cultures were positive for gram-positive rods, WBC 50.2, recent hospital admssion

## 2022-07-26 NOTE — Assessment & Plan Note (Signed)
Fever in the setting of myeloproliferative neoplasm. Temp - 100.4- 6/13. Was treated empirically with IV vancomycin and cefepime.  Cultures today growing gram-positive rods.  Likely contaminant.  Blood cultures positive in 1 bottle after 5 days.  Tmax today 99.2. -IV Vanco cefepime and metronidazole started in ED, hold off on further antibiotics for now -Follow-up repeat blood cultures ordered in the ED - F/u speciation of blood cultures 6/13

## 2022-07-26 NOTE — Telephone Encounter (Signed)
Pk call pt, TOC call nurse reported that pt stated yesterday she had increased weakness and instability since being home from hospital. No fall reported. If weakening will need re eval at the ED , if very high possibility of falls and unsteadiness, should be getting in home pT

## 2022-07-26 NOTE — Progress Notes (Signed)
Prior-To-Admission Oral Chemotherapy for Treatment of Oncologic Disease   Order noted from Dr. Mariea Clonts to continue prior-to-admission oral chemotherapy regimen of momelotinib.  Procedure Per Pharmacy & Therapeutics Committee Policy: Orders for continuation of home oral chemotherapy for treatment of an oncologic disease will be held unless approved by an oncologist during current admission.    For patients receiving oncology care at [facility], inpatient pharmacist contacts patient's oncologist during regular office hours to review. If earlier review is medically necessary, attending physician consults [facility] on-call oncologist   For patients receiving oncology care outside of [facility], attending physician consults patient's oncologist to review. If this oncologist or their coverage cannot be reached, attending physician consults [facility] on-call oncologist   Oral chemotherapy continuation order is on hold pending oncologist review, [facility] oncologist Ellin Saba will be notified by inpatient pharmacy during office hours    Junita Push PharmD 07/26/2022, 10:23 PM

## 2022-07-26 NOTE — Assessment & Plan Note (Signed)
Generalized weakness, in the setting of myeloproliferative neoplasm.  MRI Brain negative for acute abnormality. -May benefit from rehab, PT eval

## 2022-07-26 NOTE — Progress Notes (Addendum)
Pharmacy Antibiotic Note  Renee Waters is a 77 y.o. female admitted on 07/26/2022 in a immunocompromised patient on chemotherapy  Pharmacy has been consulted for Vancomycin and cefepime dosing.  Plan: Cefepime 2gm IV x1 Vancomycin 1000 mg x1. Holding off on further antibiotics as previous positive blood culture likely contaminant.   Height: 5\' 5"  (165.1 cm) Weight: 52.6 kg (116 lb) IBW/kg (Calculated) : 57  Temp (24hrs), Avg:98.9 F (37.2 C), Min:98.7 F (37.1 C), Max:99.2 F (37.3 C)  Recent Labs  Lab 07/21/22 1350 07/21/22 1420 07/22/22 0445 07/23/22 0425 07/23/22 1423 07/26/22 1647 07/26/22 1802  WBC 52.7*  --  72.8* 49.7*  --  50.2*  --   CREATININE 0.92  --  1.42* 1.48* 1.57* 2.15*  --   LATICACIDVEN  --  0.8  --   --   --   --  0.9     Estimated Creatinine Clearance: 18.5 mL/min (A) (by C-G formula based on SCr of 2.15 mg/dL (H)).    Allergies  Allergen Reactions   Bee Venom Swelling and Hives   Norvasc [Amlodipine] Other (See Comments)    Hair loss   Tylenol [Acetaminophen] Itching   Hydralazine Other (See Comments)    Hair loss   Red Dye Itching   Percocet [Oxycodone-Acetaminophen] Rash and Other (See Comments)    Pt states, "this gives her a rash, but at home she takes oxycodone for pain relief"    Tyloxapol Itching and Rash    Antimicrobials this admission: Vanco 6/18 Cefepime 6/18   Microbiology results: 6/18 Bcx: pending  6/13 Bcx: gram + rod 1/4 bottles- likely contaminant    Judeth Cornfield, PharmD Clinical Pharmacist 07/26/2022 9:36 PM

## 2022-07-26 NOTE — ED Provider Notes (Addendum)
[place] EMERGENCY DEPARTMENT AT [facility] Provider Note   CSN: [id number] Arrival date & time: 07/26/22  1436     History  Chief Complaint  Patient presents with   Weakness    Renee Waters is a 77 y.o. female.  Patient was just admitted to the hospital June 13 through June 15.  Was admitted for fever did have some antibiotics did have blood cultures.  Patient was treated with vancomycin and cefepime in cultures were remaining negative at the time of discharge.  The fever was of unknown source.  They discharged antibiotics and she was hemodynamically stable.  Patient also followed [facility] for myeloproliferative neoplasm patient is being treated with monolotinib.  Patient is also known to have chronic kidney disease stage IIIb opioid dependence is on oxycodone 15 mg.  And chronic leukemoid reaction with chronic elevated white blood cell counts.  Family brought her back today because of some confusion where she is having difficulty remembering things and also because of generalized weakness lower extremity weakness.  During hospitalization patient had CT head patient had MRI of the lumbar spine without any acute findings.  Got a phone call from Dr. Lodema Hong patient's primary care doctor that she was being notified that her blood cultures were positive for gram-positive rods.  Upon arrival here patient's temp is 99.2 pulse 57 respiration 16 blood pressure 152/60 oxygen saturation is 95%.  Past medical history significant for history of lupus and mild peripheral disorder hypertension depression and chronic back pain and right lower extremity pain.       Home Medications Prior to Admission medications   Medication Sig Start Date End Date Taking? Authorizing Provider  acyclovir (ZOVIRAX) 800 MG tablet Take 1 tablet (800 mg total) by mouth 5 (five) times daily as needed. 02/10/22   Johnson, Clanford L, MD  Allopurinol 200 MG TABS Take 200 mg by mouth daily. 05/13/22    [name], MD  cetirizine (ZYRTEC) 10 MG tablet Take 1 tablet by mouth once daily 05/23/22   Kerri Perches, MD  cholecalciferol (CHOLECALCIFEROL) 25 MCG tablet Take 1 tablet (1,000 Units total) by mouth daily. 07/24/22   Catarina Hartshorn, MD  EPINEPHrine 0.3 mg/0.3 mL IJ SOAJ injection INJECT 0.3 MLS INTO MUSCLE ONCE AS NEEDED 01/11/22   Gardenia Phlegm, MD  furosemide (LASIX) 20 MG tablet TAKE 1 TABLET BY MOUTH ONCE DAILY AS NEEDED 01/04/22   [name], MD  hydrOXYzine (ATARAX) 50 MG tablet Take 1 tablet (50 mg total) by mouth 3 (three) times daily as needed. 07/11/22   [name], MD  lidocaine-prilocaine (EMLA) cream Apply 1 Application topically as needed (Access to port and pain). 11/15/21   [name], MD  metoprolol tartrate (LOPRESSOR) 25 MG tablet Take 1 tablet (25 mg total) by mouth 2 (two) times daily. Take one and a half tablets twice daily 07/23/22   Tat, Onalee Hua, MD  mirtazapine (REMERON) 15 MG tablet Take 1 tablet (15 mg total) by mouth at bedtime. 03/09/22   Kerri Perches, MD  momelotinib dihydrochloride (OJJAARA) 150 MG tablet Take 1 tablet (150 mg total) by mouth daily. 07/11/22   [name], MD  Multiple Vitamin (MULTIVITAMIN WITH MINERALS) TABS tablet Take 1 tablet by mouth daily.    [provider]  ondansetron (ZOFRAN-ODT) 8 MG disintegrating tablet Take 1 tablet (8 mg total) by mouth every 8 (eight) hours as needed for nausea or vomiting. 03/01/22   [name], MD  oxyCODONE (ROXICODONE)  15 MG immediate release tablet Take 15 mg by mouth every 6 (six) hours as needed. 01/19/22   [provider]  pantoprazole (PROTONIX) 40 MG tablet TAKE ONE TABLET BY MOUTH 30 TO 60 MINUTES BEFORE YOUR FIRST AND LAST MEALS OF THE DAY 07/15/22   Nyoka Cowden, MD  potassium chloride (KLOR-CON) 10 MEQ tablet Take 1 tablet by mouth once daily 12/29/21   [name], MD  pregabalin (LYRICA) 75 MG capsule Take 1 capsule  (75 mg total) by mouth 2 (two) times daily. 07/11/22   [name], MD  prochlorperazine (COMPAZINE) 10 MG tablet Take 1 tablet (10 mg total) by mouth every 6 (six) hours as needed for nausea or vomiting. 01/04/22   [name], MD  temazepam (RESTORIL) 30 MG capsule Take 1 capsule (30 mg total) by mouth at bedtime as needed for sleep. 04/21/22   Kerri Perches, MD  tiZANidine (ZANAFLEX) 4 MG tablet Take 1 tablet (4 mg total) by mouth every 8 (eight) hours as needed for muscle spasms. 05/17/22   Kerri Perches, MD      Allergies    Bee venom, Norvasc [amlodipine], Tylenol [acetaminophen], Hydralazine, Red dye, Percocet [oxycodone-acetaminophen], and Tyloxapol    Review of Systems   Review of Systems  Constitutional:  Positive for fatigue. Negative for chills and fever.  HENT:  Negative for ear pain and sore throat.   Eyes:  Negative for pain and visual disturbance.  Respiratory:  Negative for cough and shortness of breath.   Cardiovascular:  Negative for chest pain and palpitations.  Gastrointestinal:  Negative for abdominal pain and vomiting.  Genitourinary:  Negative for dysuria and hematuria.  Musculoskeletal:  Negative for arthralgias and back pain.  Skin:  Negative for color change and rash.  Neurological:  Positive for weakness. Negative for seizures and syncope.  Psychiatric/Behavioral:  Positive for confusion.   All other systems reviewed and are negative.   Physical Exam Updated Vital Signs BP (!) 174/73   Pulse 67   Temp 99.2 F (37.3 C) (Oral)   Resp 14   Ht 1.651 m (5\' 5" )   Wt 52.6 kg   SpO2 96%   BMI 19.30 kg/m  Physical Exam Vitals and nursing note reviewed.  Constitutional:      General: She is not in acute distress.    Appearance: Normal appearance. She is well-developed.  HENT:     Head: Normocephalic and atraumatic.  Eyes:     Extraocular Movements: Extraocular movements intact.     Conjunctiva/sclera: Conjunctivae normal.      Pupils: Pupils are equal, round, and reactive to light.  Cardiovascular:     Rate and Rhythm: Normal rate and regular rhythm.     Heart sounds: No murmur heard. Pulmonary:     Effort: Pulmonary effort is normal. No respiratory distress.     Breath sounds: Normal breath sounds.  Abdominal:     Palpations: Abdomen is soft.     Tenderness: There is no abdominal tenderness.  Musculoskeletal:        General: No swelling.     Cervical back: Normal range of motion and neck supple.  Skin:    General: Skin is warm and dry.     Capillary Refill: Capillary refill takes less than 2 seconds.  Neurological:     General: No focal deficit present.     Mental Status: She is alert. Mental status is at baseline.     Motor: Weakness present.  Comments: Patient alert and will follow commands.  Some mild weakness to lower extremities but no focal deficit.  Psychiatric:        Mood and Affect: Mood normal.     ED Results / Procedures / Treatments   Labs (all labs ordered are listed, but only abnormal results are displayed) Labs Reviewed  CBC - Abnormal; Notable for the following components:      Result Value   WBC 50.2 (*)    RBC 2.64 (*)    Hemoglobin 8.1 (*)    HCT 25.6 (*)    RDW 21.6 (*)    Platelets 109 (*)    nRBC 5.5 (*)    All other components within normal limits  COMPREHENSIVE METABOLIC PANEL - Abnormal; Notable for the following components:   CO2 18 (*)    BUN 46 (*)    Creatinine, Ser 2.15 (*)    Calcium 8.4 (*)    AST 47 (*)    GFR, Estimated 23 (*)    All other components within normal limits  DIFFERENTIAL - Abnormal; Notable for the following components:   Neutro Abs 32.6 (*)    Monocytes Absolute 3.0 (*)    nRBC 5 (*)    Abs Immature Granulocytes 11.04 (*)    All other components within normal limits  CBG MONITORING, ED - Abnormal; Notable for the following components:   Glucose-Capillary 104 (*)    All other components within normal limits  CULTURE, BLOOD  (ROUTINE X 2)  CULTURE, BLOOD (ROUTINE X 2)  URINALYSIS, ROUTINE W REFLEX MICROSCOPIC  LACTIC ACID, PLASMA  LACTIC ACID, PLASMA    EKG EKG Interpretation  Date/Time:  Tuesday July 26 2022 15:08:20 EDT Ventricular Rate:  60 PR Interval:  177 QRS Duration: 92 QT Interval:  434 QTC Calculation: 434 R Axis:   58 Text Interpretation: Sinus arrhythmia Nonspecific T abnrm, anterolateral leads New since previous tracing Confirmed by Vanetta Mulders [phone]) on 07/26/2022 3:14:34 PM  Radiology DG Chest Port 1 View  Result Date: 07/26/2022 CLINICAL DATA:  Weakness EXAM: PORTABLE CHEST 1 VIEW COMPARISON:  X-ray 07/21/2022 FINDINGS: Normal cardiopericardial silhouette. Calcified aorta. No pneumothorax, effusion or consolidation. Mild basilar atelectasis. No edema. Underlying chronic lung changes. Right IJ chest port with tip along the central SVC above the right atrium. Overlapping cardiac leads. IMPRESSION: Chronic changes.  Chest port. Slight basilar atelectasis. Electronically Signed   By: Karen Kays M.D.   On: 07/26/2022 16:13    Procedures Procedures    Medications Ordered in ED Medications  0.9 %  sodium chloride infusion ( Intravenous New Bag/Given 07/26/22 1649)  ceFEPIme (MAXIPIME) 2 g in sodium chloride 0.9 % 100 mL IVPB (2 g Intravenous New Bag/Given 07/26/22 1755)  metroNIDAZOLE (FLAGYL) IVPB 500 mg (500 mg Intravenous New Bag/Given 07/26/22 1757)  vancomycin (VANCOCIN) IVPB 1000 mg/200 mL premix (has no administration in time range)    ED Course/ Medical Decision Making/ A&P                             Medical Decision Making Amount and/or Complexity of Data Reviewed Labs: ordered. Radiology: ordered.  Risk Prescription drug management. Decision regarding hospitalization.   CRITICAL CARE Performed by: Vanetta Mulders Total critical care time: 40 minutes Critical care time was exclusive of separately billable procedures and treating other patients. Critical care  was necessary to treat or prevent imminent or life-threatening deterioration. Critical care was time spent personally by  me on the following activities: development of treatment plan with patient and/or surrogate as well as nursing, discussions with consultants, evaluation of patient's response to treatment, examination of patient, obtaining history from patient or surrogate, ordering and performing treatments and interventions, ordering and review of laboratory studies, ordering and review of radiographic studies, pulse oximetry and re-evaluation of patient's condition.  Positive cultures in the blood for gram-positive rods.  Based on this we will get blood cultures will get lactic acid and will restart on broad-spectrum antibiotics and they can narrow it down.  White blood cell count today is 50.2 platelets are 109K complete metabolic panel significant for CO2 of 18 electrolytes are normal creatinine is 2.15 which is almost double where she was when she was in the hospital so there is a component of acute kidney injury.  Her a GFR of 23 anion gap is normal.  On her differential is almost all absolute neutrophils at 32.6.  MRI brain pending chest x-ray without any acute findings.  And her right IJ chest port is they are in proper place.  Most likely will require readmission once I know MRI results will contact hospitalist for admission.  Am giving the patient some IV fluids because she appeared a little bit dehydrated and her renal function suggest that that may be necessary.  Patient does not appear to meet distinct septic criteria so will not do the full sepsis order set.  But will treat with broad-spectrum antibiotics and will give some IV fluids for the acute kidney injury.     Final Clinical Impression(s) / ED Diagnoses Final diagnoses:  Positive blood cultures  Weakness  Altered mental status, unspecified altered mental status type  AKI (acute kidney injury) [facility])    Rx / DC Orders ED  Discharge Orders     None         Vanetta Mulders, MD 07/26/22 1806    Vanetta Mulders, MD 07/26/22 1807

## 2022-07-26 NOTE — Assessment & Plan Note (Signed)
Blood Pressure elevated up to 180s. -Resume metoprolol -May need oral agents for blood pressure control - IV hydralazine 5 mg every 4 hours as needed for systolic greater than 180

## 2022-07-26 NOTE — Telephone Encounter (Signed)
Patient is at the ER now 

## 2022-07-27 DIAGNOSIS — R531 Weakness: Secondary | ICD-10-CM | POA: Diagnosis present

## 2022-07-27 DIAGNOSIS — D849 Immunodeficiency, unspecified: Secondary | ICD-10-CM | POA: Diagnosis present

## 2022-07-27 DIAGNOSIS — R7881 Bacteremia: Secondary | ICD-10-CM | POA: Diagnosis not present

## 2022-07-27 DIAGNOSIS — N1832 Chronic kidney disease, stage 3b: Secondary | ICD-10-CM | POA: Diagnosis not present

## 2022-07-27 DIAGNOSIS — N184 Chronic kidney disease, stage 4 (severe): Secondary | ICD-10-CM | POA: Diagnosis present

## 2022-07-27 DIAGNOSIS — E860 Dehydration: Secondary | ICD-10-CM | POA: Diagnosis present

## 2022-07-27 DIAGNOSIS — Z8719 Personal history of other diseases of the digestive system: Secondary | ICD-10-CM | POA: Diagnosis not present

## 2022-07-27 DIAGNOSIS — Z87891 Personal history of nicotine dependence: Secondary | ICD-10-CM | POA: Diagnosis not present

## 2022-07-27 DIAGNOSIS — Z8249 Family history of ischemic heart disease and other diseases of the circulatory system: Secondary | ICD-10-CM | POA: Diagnosis not present

## 2022-07-27 DIAGNOSIS — Z79899 Other long term (current) drug therapy: Secondary | ICD-10-CM | POA: Diagnosis not present

## 2022-07-27 DIAGNOSIS — K219 Gastro-esophageal reflux disease without esophagitis: Secondary | ICD-10-CM | POA: Diagnosis present

## 2022-07-27 DIAGNOSIS — Z9102 Food additives allergy status: Secondary | ICD-10-CM | POA: Diagnosis not present

## 2022-07-27 DIAGNOSIS — Z885 Allergy status to narcotic agent status: Secondary | ICD-10-CM | POA: Diagnosis not present

## 2022-07-27 DIAGNOSIS — Z1152 Encounter for screening for COVID-19: Secondary | ICD-10-CM | POA: Diagnosis not present

## 2022-07-27 DIAGNOSIS — I129 Hypertensive chronic kidney disease with stage 1 through stage 4 chronic kidney disease, or unspecified chronic kidney disease: Secondary | ICD-10-CM | POA: Diagnosis present

## 2022-07-27 DIAGNOSIS — N179 Acute kidney failure, unspecified: Secondary | ICD-10-CM | POA: Diagnosis not present

## 2022-07-27 DIAGNOSIS — Z9049 Acquired absence of other specified parts of digestive tract: Secondary | ICD-10-CM | POA: Diagnosis not present

## 2022-07-27 DIAGNOSIS — Z96641 Presence of right artificial hip joint: Secondary | ICD-10-CM | POA: Diagnosis present

## 2022-07-27 DIAGNOSIS — Z91030 Bee allergy status: Secondary | ICD-10-CM | POA: Diagnosis not present

## 2022-07-27 DIAGNOSIS — Z888 Allergy status to other drugs, medicaments and biological substances status: Secondary | ICD-10-CM | POA: Diagnosis not present

## 2022-07-27 DIAGNOSIS — F1120 Opioid dependence, uncomplicated: Secondary | ICD-10-CM | POA: Diagnosis present

## 2022-07-27 DIAGNOSIS — K2270 Barrett's esophagus without dysplasia: Secondary | ICD-10-CM | POA: Diagnosis present

## 2022-07-27 DIAGNOSIS — M329 Systemic lupus erythematosus, unspecified: Secondary | ICD-10-CM | POA: Diagnosis present

## 2022-07-27 DIAGNOSIS — D471 Chronic myeloproliferative disease: Secondary | ICD-10-CM | POA: Diagnosis not present

## 2022-07-27 DIAGNOSIS — Z90710 Acquired absence of both cervix and uterus: Secondary | ICD-10-CM | POA: Diagnosis not present

## 2022-07-27 LAB — CBC
HCT: 24.6 % — ABNORMAL LOW (ref 36.0–46.0)
Hemoglobin: 7.7 g/dL — ABNORMAL LOW (ref 12.0–15.0)
MCH: 30.4 pg (ref 26.0–34.0)
MCHC: 31.3 g/dL (ref 30.0–36.0)
MCV: 97.2 fL (ref 80.0–100.0)
Platelets: 124 10*3/uL — ABNORMAL LOW (ref 150–400)
RBC: 2.53 MIL/uL — ABNORMAL LOW (ref 3.87–5.11)
RDW: 21.8 % — ABNORMAL HIGH (ref 11.5–15.5)
WBC: 54.3 10*3/uL (ref 4.0–10.5)
nRBC: 7.3 % — ABNORMAL HIGH (ref 0.0–0.2)

## 2022-07-27 LAB — BASIC METABOLIC PANEL
Anion gap: 6 (ref 5–15)
BUN: 36 mg/dL — ABNORMAL HIGH (ref 8–23)
CO2: 19 mmol/L — ABNORMAL LOW (ref 22–32)
Calcium: 8.1 mg/dL — ABNORMAL LOW (ref 8.9–10.3)
Chloride: 115 mmol/L — ABNORMAL HIGH (ref 98–111)
Creatinine, Ser: 1.58 mg/dL — ABNORMAL HIGH (ref 0.44–1.00)
GFR, Estimated: 34 mL/min — ABNORMAL LOW (ref >60)
Glucose, Bld: 107 mg/dL — ABNORMAL HIGH (ref 70–99)
Potassium: 4.5 mmol/L (ref 3.5–5.1)
Sodium: 140 mmol/L (ref 135–145)

## 2022-07-27 LAB — CULTURE, BLOOD (ROUTINE X 2)

## 2022-07-27 MED ORDER — BENZONATATE 100 MG PO CAPS: 100.0000 mg | ORAL_CAPSULE | Freq: Three times a day (TID) | ORAL | Status: DC | PRN

## 2022-07-27 MED ORDER — DM-GUAIFENESIN ER 30-600 MG PO TB12
1.0000 | ORAL_TABLET | Freq: Two times a day (BID) | ORAL | Status: DC
  Administered 2022-07-27 – 2022-07-28 (×4): 1 via ORAL
  Filled 2022-07-27 (×5): qty 1

## 2022-07-27 MED ORDER — CHLORHEXIDINE GLUCONATE CLOTH 2 % EX PADS
6.0000 | MEDICATED_PAD | Freq: Every day | CUTANEOUS | Status: DC
  Administered 2022-07-27 – 2022-07-28 (×2): 6 via TOPICAL

## 2022-07-27 MED ORDER — AMLODIPINE BESYLATE 5 MG PO TABS
10.0000 mg | ORAL_TABLET | Freq: Every day | ORAL | Status: DC
  Administered 2022-07-27 – 2022-07-29 (×3): 10 mg via ORAL
  Filled 2022-07-27 (×3): qty 2

## 2022-07-27 NOTE — Progress Notes (Signed)
[facility] PROGRESS NOTE  Renee Waters (DOB: 1945/03/17) WGN:[id number] PCP: Kerri Perches, MD  Brief Narrative: Renee Waters is a 77 y.o. female with medical history significant for CKD 3, hypertension, depression, lupus, HSV-2. Patient presented to the ED with complaints of generalized weakness.     She was just discharged after hospitalization 6/13 to 6/15 for fever of unknown etiology- 100.4, electrolyte abnormalities, hypokalemia, hypomagnesemia, hypocalcemia.  She was treated with empiric Vanco and cefepime, with negative cultures she was discharged, chest x-ray was clear, UA negative for pyuria and COVID was negative.  Antibiotics were DC'd on discharge. Today, patient was called about positive blood cultures -for gram-positive rods-cultures now positive today, in 1 anaerobic bottle that was drawn 6/13.    She reports since getting home, she has been generally weak and unable to ambulate, poor oral intake.  She reports persistent cough, no difficulty breathing, no vomiting or loose stools, no chest pain.  No fevers.   ED Course: Blood pressure elevated 140s to 180s.  Tmax 99.2.  Heart rates 50s to 70s.  O2 sats greater than 94% on room air.  Chronic leukocytosis, 50.2 today.  Creatinine elevated at 2.15.  Lactic acid 0.9. Chest x-ray with chronic changes. MRI brain with no acute reversible specific cause for symptoms.  Shows generalized atrophy. IV vancomycin cefepime and metronidazole started.  bolus given hospitalist admit for AKI and positive blood cultures.  Subjective: Had HA earlier today, but better now. Some sore throat developed on day of discharge but is resolved currently. No neck pain or stiffness, no N/V/D. Talks in her sleep and this can be confused for hallucinations at times per daughter at bedside. No confusion currently.   Objective: BP (!) 187/77 (BP Location: Left Arm)   Pulse 63   Temp 98.9 F (37.2 C) (Oral)   Resp 16   Ht 5\' 5"  (1.651  m)   Wt 52.6 kg   SpO2 100%   BMI 19.30 kg/m   Gen: Elderly thin female in no distress Pulm: Clear, nonlabored  CV: RRR, no MRG, active precordium. No edema GI: Soft, NT, ND, +BS Neuro: Alert and oriented. No new focal deficits. Ext: Warm, no deformities. Decreased muscle bulk.  Skin: No rashes, lesions or ulcers on visualized skin   Assessment & Plan: AKI on stage IV CKD: Improved. Suspect chronic component from HTN nephrosclerosis.  - Will hold lasix an additional day, but stop IVF since her po intake has improved. Avoid nephrotoxins.    Fever, positive blood culture:  - GPR in blood culture DDx includes contaminant (favored) vs. true infection in immunocompromised host (myeloproliferative d/o, immunotherapy, SLE) with history of throat infection (Diphtheroid on DDx), Listeria and Clostridium on DDx technically as well though no localizing symptoms of diarrhea, rash/wounds, meningismus, etc.  - Vancomycin, cefepime and metronidazole started in ED. We will hold further abx since no evolving sepsis is present. Monitor repeat blood cultures and further speciation of prior cultures (from 6/13). Given her immunocompromised state, presence of port, I am inclined to confirm clinical stability and no blood culture growth x48 hours prior to feeling reassured enough to discharge. Family in agreement.      Generalized weakness: In the setting of myeloproliferative neoplasm.  MRI Brain negative for acute abnormality. -May benefit from rehab, PT eval   Myeloproliferative disorder: Undergoing treatment with momelotinib per Dr. Ellin Saba.  - WBC stable, not a reliable marker for infection at this time. Her Dx and Tx confers increased risk of morbidity/mortality  from infection.    HTN: Uncontrolled, severe with headache this AM which may or may not be related.  - Continue metoprolol - Continue hydralazine prn. Will need to discuss options with pt/family if continues with caution to avoid precipitous  decrease. Hx hair loss with hydralazine and norvasc, though norvasc is likely best option. No diuretic/RAAS agents planned with AKI currently. Nitrate may worsen HA.   Tyrone Nine, MD [facility] [email or web address] 07/27/2022, 9:04 AM

## 2022-07-27 NOTE — TOC CM/SW Note (Signed)
Transition of Care [facility]) - Inpatient Brief Assessment   Patient Details  Name: Renee Waters MRN: [id number] Date of Birth: [birth year]  Transition [facility]) CM/SW Contact:    Karn Cassis, LCSW Phone Number: 07/27/2022, 9:26 AM   Clinical Narrative: Pt admitted due to acute on chronic kidney injury. She reports she lives with her husband. Children assist with meals. Pt is active with [facility] HHPT. Morrie Sheldon with [facility] notified of admission. Will need resumption orders. TOC to follow.     Transition of Care Asessment: Insurance and Status: Insurance coverage has been reviewed Patient has primary care physician: Yes Home environment has been reviewed: Lives with husband. Prior level of function:: Independent Prior/Current Home Services: Current home services [facility] PT) Social Determinants of Health Reivew: SDOH reviewed no interventions necessary Readmission risk has been reviewed: Yes Transition of care needs: no transition of care needs at this time (Will follow for home health resumption.)

## 2022-07-27 NOTE — Progress Notes (Signed)
Mobility Specialist Progress Note:    07/27/22 1153  Mobility  Activity Moved into chair position in bed  Assistive Device None  Range of Motion/Exercises Active;All extremities  Activity Response Tolerated well  Mobility Referral Yes  $Mobility charge 1 Mobility  Mobility Specialist Start Time (ACUTE ONLY) 1130  Mobility Specialist Stop Time (ACUTE ONLY) 1140  Mobility Specialist Time Calculation (min) (ACUTE ONLY) 10 min   Pt received in bed, husband at bedside. BP trending high, 192/77 (110), RN aware. Deferred ambulation until BP in normal range. Left pt in bed, all needs met, husband still in room.    Feliciana Rossetti Mobility Specialist Please contact via Special educational needs teacher or  Rehab office at [phone]

## 2022-07-28 ENCOUNTER — Ambulatory Visit: Payer: Medicare Other | Admitting: Family Medicine

## 2022-07-28 DIAGNOSIS — N1832 Chronic kidney disease, stage 3b: Secondary | ICD-10-CM | POA: Diagnosis not present

## 2022-07-28 DIAGNOSIS — N179 Acute kidney failure, unspecified: Secondary | ICD-10-CM | POA: Diagnosis not present

## 2022-07-28 LAB — CULTURE, BLOOD (ROUTINE X 2)

## 2022-07-28 MED ORDER — CARVEDILOL 12.5 MG PO TABS
12.5000 mg | ORAL_TABLET | Freq: Two times a day (BID) | ORAL | Status: DC
  Filled 2022-07-28: qty 1

## 2022-07-28 MED ORDER — CLOTRIMAZOLE 10 MG MT TROC
10.0000 mg | Freq: Every day | OROMUCOSAL | Status: DC
  Administered 2022-07-28 – 2022-07-29 (×5): 10 mg via ORAL
  Filled 2022-07-28 (×21): qty 1

## 2022-07-28 MED ORDER — METOPROLOL TARTRATE 25 MG PO TABS
25.0000 mg | ORAL_TABLET | Freq: Two times a day (BID) | ORAL | Status: DC
  Administered 2022-07-28 – 2022-07-29 (×2): 25 mg via ORAL
  Filled 2022-07-28 (×2): qty 1

## 2022-07-28 MED ORDER — NYSTATIN 100000 UNIT/ML MT SUSP: 5.0000 mL | Freq: Four times a day (QID) | OROMUCOSAL | Status: DC

## 2022-07-28 NOTE — Evaluation (Signed)
Physical Therapy Evaluation Patient Details Name: Renee Waters MRN: [id number] DOB: 1945/08/01 Today's Date: 07/28/2022  History of Present Illness  Renee Waters is a 77 y.o. female with medical history significant for CKD 3, hypertension, depression, lupus, HSV-2.  Patient presented to the ED with complaints of generalized weakness.       She was just discharged after hospitalization 6/13 to 6/15 for fever of unknown etiology- 100.4, electrolyte abnormalities, hypokalemia, hypomagnesemia, hypocalcemia.  She was treated with empiric Vanco and cefepime, with negative cultures she was discharged, chest x-ray was clear, UA negative for pyuria and COVID was negative.  Antibiotics were DC'd on discharge.  Today, patient was called about positive blood cultures -for gram-positive rods-cultures now positive today, in 1 anaerobic bottle that was drawn 6/13.      She reports since getting home, she has been generally weak and unable to ambulate, poor oral intake.  She reports persistent cough, no difficulty breathing, no vomiting or loose stools, no chest pain.  No fevers.   Clinical Impression  Patient had slow, labored movement for sitting up at bedside, initially unsteady on feet during sit to standing and complained of nausea and dizziness. Patient ambulated 70 feet with a slow labored cadence using RW. No gross loss of balance but limited mostly due to fatigue. Patient complained of nausea and dizziness throughout upright activities.  Patient tolerated sitting up in chair after therapy with her significant other present - nursing notified.  Patient will benefit from continued skilled physical therapy in hospital and recommended venue below to increase strength, balance, endurance for safe ADLs and gait.        Recommendations for follow up therapy are one component of a multi-disciplinary discharge planning process, led by the attending physician.  Recommendations may be updated based on patient status,  additional functional criteria and insurance authorization.  Follow Up Recommendations       Assistance Recommended at Discharge Set up Supervision/Assistance  Patient can return home with the following  A little help with walking and/or transfers;A little help with bathing/dressing/bathroom;Help with stairs or ramp for entrance;Assistance with cooking/housework;Assist for transportation    Equipment Recommendations None recommended by PT  Recommendations for Other Services       Functional Status Assessment Patient has had a recent decline in their functional status and demonstrates the ability to make significant improvements in function in a reasonable and predictable amount of time.     Precautions / Restrictions Precautions Precautions: Fall Precaution Comments: six falls prior to this admission Restrictions Weight Bearing Restrictions: No      Mobility  Bed Mobility Overal bed mobility: Needs Assistance Bed Mobility: Supine to Sit     Supine to sit: Min guard, Supervision     General bed mobility comments: increased time, labored movement    Transfers Overall transfer level: Needs assistance Equipment used: Rolling walker (2 wheels) Transfers: Sit to/from Stand, Bed to chair/wheelchair/BSC Sit to Stand: Min guard   Step pivot transfers: Recruitment consultant transfers: Supervision, Min guard   General transfer comment: somewhat unsteady labored movement with with complaints of nauesea and dizziness    Ambulation/Gait Ambulation/Gait assistance: Min assist, Min guard Gait Distance (Feet): 70 Feet Assistive device: Rolling walker (2 wheels) Gait Pattern/deviations: Decreased step length - right, Decreased step length - left, Decreased stride length, Wide base of support Gait velocity: decreased     General Gait Details: slow labored cadence using RW with gait deviations noted above; no gross loss of  balance; limited mostly due to fatigue;  complaints of nausea and dizziness throughout; on room air throughout  Stairs            Wheelchair Mobility    Modified Rankin (Stroke Patients Only)       Balance Overall balance assessment: Needs assistance Sitting-balance support: Feet supported, No upper extremity supported Sitting balance-Leahy Scale: Fair Sitting balance - Comments: fair/good seated at EOB   Standing balance support: During functional activity, Bilateral upper extremity supported Standing balance-Leahy Scale: Fair Standing balance comment: using RW             Pertinent Vitals/Pain Pain Assessment Pain Assessment: 0-10 Pain Score: 9  Pain Location: right hip, spine, across low back Pain Descriptors / Indicators: Aching Pain Intervention(s): Limited activity within patient's tolerance, Monitored during session, Repositioned, Patient requesting pain meds-RN notified    Home Living Family/patient expects to be discharged to:: Private residence Living Arrangements: Spouse/significant other Available Help at Discharge: Family;Available 24 hours/day Type of Home: House Home Access: Stairs to enter Entrance Stairs-Rails: Right Entrance Stairs-Number of Steps: 3-5   Home Layout: Two level;Able to live on main level with bedroom/bathroom;Full bath on main level Home Equipment: Rollator (4 wheels);Grab bars - tub/shower;BSC/3in1;Cane - single point;Grab bars - toilet;Cane - quad;Rolling Walker (2 wheels)      Prior Function Prior Level of Function : Independent/Modified Independent             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, occasionally drives ADLs Comments: Independent with ADLs     Hand Dominance   Dominant Hand: Right    Extremity/Trunk Assessment                Communication   Communication: No difficulties  Cognition Arousal/Alertness: Awake/alert Behavior During Therapy: WFL for tasks assessed/performed Overall Cognitive Status: Within  Functional Limits for tasks assessed              General Comments      Exercises     Assessment/Plan    PT Assessment Patient needs continued PT services  PT Problem List Decreased strength;Decreased activity tolerance;Decreased balance;Decreased mobility       PT Treatment Interventions DME instruction;Gait training;Stair training;Functional mobility training;Therapeutic activities;Therapeutic exercise;Patient/family education;Balance training    PT Goals (Current goals can be found in the Care Plan section)  Acute Rehab PT Goals Patient Stated Goal: return home with family to assist PT Goal Formulation: With patient/family Time For Goal Achievement: 08/11/22 Potential to Achieve Goals: Good    Frequency Min 3X/week        AM-PAC PT "6 Clicks" Mobility  Outcome Measure Help needed turning from your back to your side while in a flat bed without using bedrails?: A Little Help needed moving from lying on your back to sitting on the side of a flat bed without using bedrails?: A Little Help needed moving to and from a bed to a chair (including a wheelchair)?: A Little Help needed standing up from a chair using your arms (e.g., wheelchair or bedside chair)?: A Little Help needed to walk in hospital room?: A Little Help needed climbing 3-5 steps with a railing? : A Lot 6 Click Score: 17    End of Session   Activity Tolerance: Patient tolerated treatment well;Patient limited by fatigue Patient left: in chair;with call bell/phone within reach;with family/visitor present Nurse Communication: Mobility status PT Visit Diagnosis: Unsteadiness on feet (R26.81);Other abnormalities of gait and mobility (R26.89);Muscle weakness (generalized) (M62.81)    Time:  4098-1191 PT Time Calculation (min) (ACUTE ONLY): 32 min   Charges:   PT Evaluation $PT Eval Low Complexity: 1 Low PT Treatments $Therapeutic Activity: 8-22 mins        Katina Dung. Hartnett-Rands, MS, PT [facility]  PT [facility] [place] [phone]  Britta Mccreedy  Hartnett-Rands 07/28/2022, 12:18 PM

## 2022-07-28 NOTE — Progress Notes (Addendum)
[facility] PROGRESS NOTE  Jahasia Naimi (DOB: 1945/06/09) ZOX:[id number] PCP: Kerri Perches, MD  Brief Narrative: Renee Waters is a 77 y.o. female with medical history significant for CKD 3, hypertension, depression, lupus, HSV-2. Patient presented to the ED with complaints of generalized weakness.     She was just discharged after hospitalization 6/13 to 6/15 for fever of unknown etiology- 100.4, electrolyte abnormalities, hypokalemia, hypomagnesemia, hypocalcemia.  She was treated with empiric Vanco and cefepime, with negative cultures she was discharged, chest x-ray was clear, UA negative for pyuria and COVID was negative.  Antibiotics were DC'd on discharge. Today, patient was called about positive blood cultures -for gram-positive rods-cultures now positive today, in 1 anaerobic bottle that was drawn 6/13.    She reports since getting home, she has been generally weak and unable to ambulate, poor oral intake.  She reports persistent cough, no difficulty breathing, no vomiting or loose stools, no chest pain.  No fevers.   ED Course: Blood pressure elevated 140s to 180s.  Tmax 99.2.  Heart rates 50s to 70s.  O2 sats greater than 94% on room air.  Chronic leukocytosis, 50.2 today.  Creatinine elevated at 2.15.  Lactic acid 0.9. Chest x-ray with chronic changes. MRI brain with no acute reversible specific cause for symptoms.  Shows generalized atrophy. IV vancomycin cefepime and metronidazole started.  bolus given hospitalist admit for AKI and positive blood cultures.  Subjective: Feeling better overall, still some odynophagia at times. No cough. No headache or dizziness currently. No fever/chills. Spouse at bedside, daughter by phone. There were interpersonal conflicts with staff yesterday. Ok today.  Objective: BP (!) 173/80 (BP Location: Left Arm)   Pulse 65   Temp 98.3 F (36.8 C) (Oral)   Resp 18   Ht 5\' 5"  (1.651 m)   Wt 52.6 kg   SpO2 100%   BMI 19.30  kg/m   Gen: No distress, thin HEENT: Oropharynx clear with no significant erythema, no leukoplakia, no tonsil enlargement. No palpable cervical lymphadenopathy.  Pulm: Clear, nonlabored  CV: RRR, no MRG, active precordium, no edema. GI: Soft, NT, ND, +BS Neuro: Alert and oriented. No new focal deficits. Ext: Warm, no deformities Skin: No rashes, lesions or ulcers on visualized skin.  Assessment & Plan: AKI on stage IV CKD: Improved. Suspect chronic component from HTN nephrosclerosis.  - Will hold lasix, taking this prn. Stopped IVF.  Cr improved, CrCl now near baseline 28ml/min. Avoid nephrotoxins.    Fever, positive blood culture:  - GPR in blood culture DDx includes contaminant (favored) vs. true infection in immunocompromised host (myeloproliferative d/o, immunotherapy, SLE) with history of throat infection (Diphtheroid on DDx), Listeria and Clostridium on DDx technically as well though no localizing symptoms of diarrhea, rash/wounds, meningismus, etc.  - Vancomycin, cefepime and metronidazole started in ED. We will hold further abx since no evolving sepsis is present. Monitor repeat blood cultures (this admission, drawn 6/18) and further speciation of prior cultures (from 6/13, still pending checked this AM). Given her immunocompromised state, presence of port, I am inclined to confirm clinical stability and no blood culture growth x48 hours prior to feeling reassured enough to discharge. Family in agreement.   Generalized weakness: In the setting of myeloproliferative neoplasm.  MRI Brain negative for acute abnormality. -May benefit from rehab, has [facility] currently set up, formal PT evaluation is pending.   Myeloproliferative disorder: Undergoing treatment with momelotinib per Dr. Ellin Saba.  - WBC stable, not a reliable marker for infection at this time.  Her Dx and Tx confers increased risk of morbidity/mortality from infection.    HTN: Uncontrolled, severe with headache this AM which may  or may not be related.  - Change metoprolol to coreg - Continue norvasc 10mg . Pt consents to this at this time.  - F/u with PCP who is managing BP primarily, or can follow up with Dr. Carolan Clines. - Continue hydralazine prn. Hx hair loss with hydralazine and norvasc, so not good long term options. No diuretic/RAAS agents planned with AKI currently. Nitrate may worsen HA.   Odynophagia:  - Empiric nystatin though no definite fungal infection. If not improving, would suggest EGD.  Tyrone Nine, MD [facility] [email or web address] 07/28/2022, 10:20 AM

## 2022-07-28 NOTE — TOC Initial Note (Signed)
Transition of Care [facility]) - Initial/Assessment Note    Patient Details  Name: Renee Waters MRN: [id number] Date of Birth: Dec 23, 1945  [facility]) CM/SW Contact:    Elliot Gault, LCSW Phone Number: 07/28/2022, 11:00 AM  Clinical Narrative:                  Pt inpatient status with high readmission risk score. Assessment completed. Pt is from home with spouse and plan is for return to home at dc. Pt active with [facility] for PT and plan is for resumption of the HH at dc. Family support available as needed. Pt able to get to appointments and obtain medication as needed.  TOC will follow and assist with dc planning.  Expected Discharge Plan: Home w Home Health Services Barriers to Discharge: Continued Medical Work up   Patient Goals and CMS Choice Patient states their goals for this hospitalization and ongoing recovery are:: return home CMS Medicare.gov Compare Post Acute Care list provided to:: Patient Choice offered to / list presented to : Patient      Expected Discharge Plan and Services In-house Referral: Clinical Social Work   Post Acute Care Choice: Resumption of Svcs/PTA Provider Living arrangements for the past 2 months: Single Family Home                                      Prior Living Arrangements/Services Living arrangements for the past 2 months: Single Family Home Lives with:: Spouse Patient language and need for interpreter reviewed:: Yes Do you feel safe going back to the place where you live?: Yes      Need for Family Participation in Patient Care: Yes (Comment) Care giver support system in place?: Yes (comment)   Criminal Activity/Legal Involvement Pertinent to Current Situation/Hospitalization: No - Comment as needed  Activities of Daily Living Home Assistive Devices/Equipment: Walker (specify type) ADL Screening (condition at time of admission) Patient's cognitive ability adequate to safely complete daily activities?: Yes Is the  patient deaf or have difficulty hearing?: No Does the patient have difficulty seeing, even when wearing glasses/contacts?: Yes Does the patient have difficulty concentrating, remembering, or making decisions?: No Patient able to express need for assistance with ADLs?: Yes Does the patient have difficulty dressing or bathing?: Yes Independently performs ADLs?: No Communication: Independent Dressing (OT): Needs assistance Is this a change from baseline?: Pre-admission baseline Grooming: Needs assistance Is this a change from baseline?: Pre-admission baseline Feeding: Independent Toileting: Needs assistance Is this a change from baseline?: Pre-admission baseline In/Out Bed: Needs assistance Is this a change from baseline?: Pre-admission baseline Walks in Home: Independent with device (comment) Is this a change from baseline?: Pre-admission baseline Does the patient have difficulty walking or climbing stairs?: Yes Weakness of Legs: Both Weakness of Arms/Hands: Both  Permission Sought/Granted                  Emotional Assessment       Orientation: : Oriented to Self, Oriented to Place, Oriented to  Time, Oriented to Situation Alcohol / Substance Use: Not Applicable Psych Involvement: No (comment)  Admission diagnosis:  Weakness [R53.1] Positive blood cultures [R78.81] AKI (acute kidney injury) (HCC) [N17.9] Altered mental status, unspecified altered mental status type [R41.82] Patient Active Problem List   Diagnosis Date Noted   Positive blood cultures 07/27/2022   Electrolyte abnormality 07/21/2022   Difficulty voiding 02/28/2022   Urinary incontinence 02/28/2022  Hyperkalemia 02/09/2022   Essential hypertension 02/09/2022   Acute-on-chronic kidney injury (HCC) 02/08/2022   Pain in the shins 11/29/2021   Subcutaneous mass of both lower legs 11/29/2021   Encounter for support and coordination of transition of care 11/29/2021   Bronchiectasis assoc with possible  tracheomalacia 11/11/2021   Chronic cough 11/01/2021   Generalized weakness 10/19/2021   Sinus bradycardia 10/19/2021   Hypertensive urgency 10/19/2021   Frequent UTI 10/19/2021   Transaminitis 10/19/2021   Thrombocytopenia (HCC) 10/19/2021   Cystitis 10/10/2021   SIRS (systemic inflammatory response syndrome) (HCC) 09/01/2021   Fall at home, initial encounter 09/01/2021   Hypotension    Hypomagnesemia 08/27/2021   Normocytic anemia 08/26/2021   MPN (myeloproliferative neoplasm) (HCC) 04/21/2021   Anxiety 04/05/2021   Fever 01/21/2021   Leukocytosis 11/09/2020   CKD (chronic kidney disease) stage 3, GFR 30-59 ml/min (HCC) 10/29/2020   Dermatomycosis 08/02/2020   HSV-2 (herpes simplex virus 2) infection 08/02/2020   Rib pain on left side 03/21/2020   Itching 03/21/2020   Mildly underweight adult 09/19/2019   MDD (major depressive disorder), recurrent episode, moderate (HCC) 10/28/2016   Thoracic spine pain 08/31/2016   Anemia, normocytic normochromic 05/27/2015   IDA (iron deficiency anemia) 03/02/2015   Unsteady gait 02/25/2015   Mucosal abnormality of esophagus    Poor urinary stream 01/16/2013   Systemic lupus (HCC) 10/31/2012   Vitamin D deficiency 03/16/2012   Generalized pain 03/15/2012   Constipation 10/06/2011   Hip pain 06/29/2011   Barrett's esophagus 12/13/2010   Western blot positive HSV2 06/22/2007   Depression 06/22/2007   Labile hypertension 06/22/2007   GERD without esophagitis 06/22/2007   DJD (degenerative joint disease) 06/22/2007   NECK PAIN, CHRONIC 06/22/2007   Chronic midline low back pain with sciatica 06/22/2007   Insomnia secondary to depression with anxiety 06/22/2007   Viral infection, unspecified 06/22/2007   PCP:  Kerri Perches, MD Pharmacy:   [facility] [address], [place] - [address] [place] [address] Phone: [phone] Fax: [phone]  [facility] - [place], [place] - [name] [place] [address] Phone: [phone] Fax: [phone]  [facility] [address], [place] - [address] [place] [address] Phone: [phone] Fax: [phone]   - [facility] [address] [place] [address] Phone: [phone] Fax: [phone]  [facility] [place], [place] - [address] [place] [address] Phone: [phone] Fax: [phone]  [facility] [place], [place] - [address] [place] [address] Phone: [phone] Fax: [phone]     Social Determinants of Health (SDOH) Social History: SDOH Screenings   Food Insecurity: No Food Insecurity (07/26/2022)  Housing: Low Risk  (07/26/2022)  Transportation Needs: No Transportation Needs (07/26/2022)  Utilities: Not At Risk (07/26/2022)  Alcohol Screen: Low Risk  (12/18/2019)  Depression (PHQ2-9): Low Risk  (04/20/2022)  Financial Resource Strain: Low Risk  (12/28/2020)  Physical Activity: Inactive (04/23/2021)  Social Connections: Moderately Integrated (12/28/2020)  Stress: Stress Concern Present (04/23/2021)  Tobacco Use: Medium Risk (07/26/2022)   SDOH Interventions:     Readmission Risk Interventions    07/28/2022   10:58 AM 07/22/2022    2:40 PM 02/09/2022   11:30 AM  Readmission Risk Prevention Plan  Transportation Screening Complete Complete Complete  Medication Review (RN  Care Manager) Complete Complete Complete  PCP or Specialist appointment within 3-5 days of discharge  Not Complete   HRI or Home Care Consult Complete Complete Complete  SW Recovery Care/Counseling Consult Complete Complete Complete  Palliative Care Screening Not Applicable Not Applicable Not Applicable  Skilled Nursing Facility Not Applicable Not Applicable Complete

## 2022-07-28 NOTE — Plan of Care (Signed)
  Problem: Acute Rehab PT Goals(only PT should resolve) Goal: Pt Will Go Supine/Side To Sit Outcome: Progressing Flowsheets (Taken 07/28/2022 1223) Pt will go Supine/Side to Sit: with supervision Goal: Pt Will Go Sit To Supine/Side Outcome: Progressing Flowsheets (Taken 07/28/2022 1223) Pt will go Sit to Supine/Side: with supervision Goal: Patient Will Transfer Sit To/From Stand Outcome: Progressing Flowsheets (Taken 07/28/2022 1223) Patient will transfer sit to/from stand: with supervision Goal: Pt Will Transfer Bed To Chair/Chair To Bed Outcome: Progressing Flowsheets (Taken 07/28/2022 1223) Pt will Transfer Bed to Chair/Chair to Bed: with supervision Goal: Pt Will Ambulate Outcome: Progressing Flowsheets (Taken 07/28/2022 1223) Pt will Ambulate:  100 feet  with min guard assist  with least restrictive assistive device   Britta Mccreedy D. Hartnett-Rands, MS, PT [facility] PT [facility] [phone] 07/28/2022

## 2022-07-29 DIAGNOSIS — N179 Acute kidney failure, unspecified: Secondary | ICD-10-CM | POA: Diagnosis not present

## 2022-07-29 DIAGNOSIS — D471 Chronic myeloproliferative disease: Secondary | ICD-10-CM | POA: Diagnosis not present

## 2022-07-29 DIAGNOSIS — R7881 Bacteremia: Secondary | ICD-10-CM

## 2022-07-29 DIAGNOSIS — N1832 Chronic kidney disease, stage 3b: Secondary | ICD-10-CM | POA: Diagnosis not present

## 2022-07-29 LAB — BASIC METABOLIC PANEL
Anion gap: 7 (ref 5–15)
BUN: 20 mg/dL (ref 8–23)
CO2: 20 mmol/L — ABNORMAL LOW (ref 22–32)
Calcium: 8.7 mg/dL — ABNORMAL LOW (ref 8.9–10.3)
Chloride: 110 mmol/L (ref 98–111)
Creatinine, Ser: 1.22 mg/dL — ABNORMAL HIGH (ref 0.44–1.00)
GFR, Estimated: 46 mL/min — ABNORMAL LOW (ref >60)
Glucose, Bld: 105 mg/dL — ABNORMAL HIGH (ref 70–99)
Potassium: 4 mmol/L (ref 3.5–5.1)
Sodium: 137 mmol/L (ref 135–145)

## 2022-07-29 LAB — CULTURE, BLOOD (ROUTINE X 2): Culture: NO GROWTH

## 2022-07-29 MED ORDER — METOPROLOL TARTRATE 25 MG PO TABS: 37.5000 mg | ORAL_TABLET | Freq: Two times a day (BID) | ORAL | 3 refills | Status: DC

## 2022-07-29 MED ORDER — HEPARIN SOD (PORK) LOCK FLUSH 100 UNIT/ML IV SOLN
500.0000 [IU] | Freq: Once | INTRAVENOUS | Status: AC
  Administered 2022-07-29: 500 [IU] via INTRAVENOUS
  Filled 2022-07-29: qty 5

## 2022-07-29 MED ORDER — CLOTRIMAZOLE 10 MG MT TROC: 10.0000 mg | Freq: Three times a day (TID) | OROMUCOSAL | 0 refills | Status: AC

## 2022-07-29 NOTE — Plan of Care (Signed)

## 2022-07-29 NOTE — Discharge Instructions (Signed)
IMPORTANT INFORMATION: PAY CLOSE ATTENTION   PHYSICIAN DISCHARGE INSTRUCTIONS  Follow with Primary care provider  Simpson, Margaret E, MD  and other consultants as instructed by your Hospitalist Physician  SEEK MEDICAL CARE OR RETURN TO EMERGENCY ROOM IF SYMPTOMS COME BACK, WORSEN OR NEW PROBLEM DEVELOPS   Please note: You were cared for by a hospitalist during your hospital stay. Every effort will be made to forward records to your primary care provider.  You can request that your primary care provider send for your hospital records if they have not received them.  Once you are discharged, your primary care physician will handle any further medical issues. Please note that NO REFILLS for any discharge medications will be authorized once you are discharged, as it is imperative that you return to your primary care physician (or establish a relationship with a primary care physician if you do not have one) for your post hospital discharge needs so that they can reassess your need for medications and monitor your lab values.  Please get a complete blood count and chemistry panel checked by your Primary MD at your next visit, and again as instructed by your Primary MD.  Get Medicines reviewed and adjusted: Please take all your medications with you for your next visit with your Primary MD  Laboratory/radiological data: Please request your Primary MD to go over all hospital tests and procedure/radiological results at the follow up, please ask your primary care provider to get all Hospital records sent to his/her office.  In some cases, they will be blood work, cultures and biopsy results pending at the time of your discharge. Please request that your primary care provider follow up on these results.  If you are diabetic, please bring your blood sugar readings with you to your follow up appointment with primary care.    Please call and make your follow up appointments as soon as possible.    Also  Note the following: If you experience worsening of your admission symptoms, develop shortness of breath, life threatening emergency, suicidal or homicidal thoughts you must seek medical attention immediately by calling 911 or calling your MD immediately  if symptoms less severe.  You must read complete instructions/literature along with all the possible adverse reactions/side effects for all the Medicines you take and that have been prescribed to you. Take any new Medicines after you have completely understood and accpet all the possible adverse reactions/side effects.   Do not drive when taking Pain medications or sleeping medications (Benzodiazepines)  Do not take more than prescribed Pain, Sleep and Anxiety Medications. It is not advisable to combine anxiety,sleep and pain medications without talking with your primary care practitioner  Special Instructions: If you have smoked or chewed Tobacco  in the last 2 yrs please stop smoking, stop any regular Alcohol  and or any Recreational drug use.  Wear Seat belts while driving.  Do not drive if taking any narcotic, mind altering or controlled substances or recreational drugs or alcohol.       

## 2022-07-29 NOTE — Care Management Important Message (Signed)
Important Message  Patient Details  Name: Renee Waters MRN: [id number] Date of Birth: 04/20/1945   Medicare Important Message Given:  N/A - LOS <3 / Initial given by admissions     Corey Harold 07/29/2022, 9:44 AM

## 2022-07-29 NOTE — Progress Notes (Signed)
Was called into pt's room with c/o feeling a random pin prick feeling all over, but only when she tries to sleep. MD notified. Will continue to monitor.

## 2022-07-29 NOTE — TOC Transition Note (Signed)
Transition of Care [facility]) - CM/SW Discharge Note   Patient Details  Name: Renee Waters MRN: [id number] Date of Birth: Jul 28, 1945  Transition of Care [facility]) CM/SW Contact:  Karn Cassis, LCSW Phone Number: 07/29/2022, 9:38 AM   Clinical Narrative: Pt d/c today. Morrie Sheldon with [facility] notified of d/c. HHPT order in.         Barriers to Discharge: Barriers Resolved   Patient Goals and CMS Choice CMS Medicare.gov Compare Post Acute Care list provided to:: Patient Choice offered to / list presented to : Patient  Discharge Placement                         Discharge Plan and Services Additional resources added to the After Visit Summary for   In-house Referral: Clinical Social Work   Post Acute Care Choice: Resumption of Svcs/PTA Provider                    HH Arranged: PT [facility] Agency: [facility] Date HH Agency Contacted: 07/29/22 Time HH Agency Contacted: [phone] Representative spoke with at [facility] Agency: Morrie Sheldon  Social Determinants of Health (SDOH) Interventions SDOH Screenings   Food Insecurity: No Food Insecurity (07/26/2022)  Housing: Low Risk  (07/26/2022)  Transportation Needs: No Transportation Needs (07/26/2022)  Utilities: Not At Risk (07/26/2022)  Alcohol Screen: Low Risk  (12/18/2019)  Depression (PHQ2-9): Low Risk  (04/20/2022)  Financial Resource Strain: Low Risk  (12/28/2020)  Physical Activity: Inactive (04/23/2021)  Social Connections: Moderately Integrated (12/28/2020)  Stress: Stress Concern Present (04/23/2021)  Tobacco Use: Medium Risk (07/26/2022)     Readmission Risk Interventions    07/28/2022   10:58 AM 07/22/2022    2:40 PM 02/09/2022   11:30 AM  Readmission Risk Prevention Plan  Transportation Screening Complete Complete Complete  Medication Review [employer]) Complete Complete Complete  PCP or Specialist appointment within 3-5 days of discharge  Not Complete   HRI or Home Care Consult Complete Complete  Complete  SW Recovery Care/Counseling Consult Complete Complete Complete  Palliative Care Screening Not Applicable Not Applicable Not Applicable  Skilled Nursing Facility Not Applicable Not Applicable Complete

## 2022-07-29 NOTE — Discharge Summary (Signed)
Physician Discharge Summary  Renee Waters ZOX:[id number] DOB: 09-Sep-1945 DOA: 07/26/2022  PCP: Kerri Perches, MD Oncologist: Dr. Ellin Saba   Admit date: 07/26/2022 Discharge date: 07/29/2022  Admitted From:  Home  Disposition:  Home with [facility]   Recommendations for Outpatient Follow-up:  Follow up with PCP in 1 weeks Follow up with Dr. Ellin Saba on 08/16/22 as scheduled   Home Health: PT   Discharge Condition: STABLE   CODE STATUS: FULL DIET: 2 gram sodium restricted   Brief Hospitalization Summary: Please see all hospital notes, images, labs for full details of the hospitalization. ADMISSION PROVIDER HPI:  77 y.o. female with medical history significant for CKD 3, hypertension, depression, lupus, HSV-2. Patient presented to the ED with complaints of generalized weakness.     She was just discharged after hospitalization 6/13 to 6/15 for fever of unknown etiology- 100.4, electrolyte abnormalities, hypokalemia, hypomagnesemia, hypocalcemia.  She was treated with empiric Vanco and cefepime, with negative cultures she was discharged, chest x-ray was clear, UA negative for pyuria and COVID was negative.  Antibiotics were DC'd on discharge.  Today, patient was called about positive blood cultures -for gram-positive rods-cultures now positive today, in 1 anaerobic bottle that was drawn 6/13.    She reports since getting home, she has been generally weak and unable to ambulate, poor oral intake.  She reports persistent cough, no difficulty breathing, no vomiting or loose stools, no chest pain.  No fevers.   ED Course: Blood pressure elevated 140s to 180s.  Tmax 99.2.  Heart rates 50s to 70s.  O2 sats greater than 94% on room air.  Chronic leukocytosis, 50.2 today.  Creatinine elevated at 2.15.  Lactic acid 0.9. Chest x-ray with chronic changes.  MRI brain with no acute reversible specific cause for symptoms.  Shows generalized atrophy.  IV vancomycin cefepime and metronidazole started.   bolus given hospitalist admit for AKI and positive blood cultures.    HOSPITAL COURSE   Patient was admitted for treatment of AKI on CKD stage IV treated with gentle IV fluid hydration and withholding Lasix temporarily.  Her creatinine improved with this treatment and we avoided nephrotoxins.  She was also monitored for positive blood culture and fever however the positive blood culture was thought to be a contaminant.  She has been monitored off all antibiotics.  I repeated blood cultures at 48 hours have been no growth to date.  She was empirically started on clotrimazole troche therapy for odynophagia which seems to be helping.  Patient has poorly controlled hypertension however patient does not want her blood pressure medications changed in the hospital and would not allow that.  She agreed to follow-up with her PCP and cardiologist who she trusts to manage her BP.  Patient is scheduled to follow-up with her oncologist Dr. Ellin Saba on 08/16/2022.  Patient was evaluated by PT and home health PT therapy has been set up.  MRI brain was negative for acute findings.  Patient is being discharged in stable condition with home health services.  Family also agreeable to 24/7 supervision at home.     Discharge Diagnoses:  Principal Problem:   Acute-on-chronic kidney injury (HCC) Active Problems:   Fever   MPN (myeloproliferative neoplasm) (HCC)   Generalized weakness   Essential hypertension   Positive blood cultures   Discharge Instructions:  Allergies as of 07/29/2022       Reactions   Bee Venom Swelling, Hives   Norvasc [amlodipine] Other (See Comments)   Hair loss  Tylenol [acetaminophen] Itching   Hydralazine Other (See Comments)   Hair loss   Red Dye Itching   Percocet [oxycodone-acetaminophen] Rash, Other (See Comments)   Pt states, "this gives her a rash, but at home she takes oxycodone for pain relief"   Tyloxapol Itching, Rash        Medication List     TAKE these  medications    acyclovir 800 MG tablet Commonly known as: ZOVIRAX Take 1 tablet (800 mg total) by mouth 5 (five) times daily as needed.   Allopurinol 200 MG Tabs Take 200 mg by mouth daily.   amLODipine 10 MG tablet Commonly known as: NORVASC Take 10 mg by mouth every morning.   cetirizine 10 MG tablet Commonly known as: ZYRTEC Take 1 tablet by mouth once daily   clotrimazole 10 MG troche Commonly known as: MYCELEX Take 1 tablet (10 mg total) by mouth 3 (three) times daily for 3 days.   EPINEPHrine 0.3 mg/0.3 mL Soaj injection Commonly known as: EPI-PEN INJECT 0.3 MLS INTO MUSCLE ONCE AS NEEDED   furosemide 20 MG tablet Commonly known as: LASIX TAKE 1 TABLET BY MOUTH ONCE DAILY AS NEEDED   hydrOXYzine 50 MG tablet Commonly known as: ATARAX Take 1 tablet (50 mg total) by mouth 3 (three) times daily as needed.   lidocaine-prilocaine cream Commonly known as: EMLA Apply 1 Application topically as needed (Access to port and pain).   metoprolol tartrate 25 MG tablet Commonly known as: LOPRESSOR Take 1.5 tablets (37.5 mg total) by mouth 2 (two) times daily. What changed:  how much to take additional instructions   mirtazapine 15 MG tablet Commonly known as: REMERON Take 1 tablet (15 mg total) by mouth at bedtime.   multivitamin with minerals Tabs tablet Take 1 tablet by mouth daily.   Ojjaara 150 MG tablet Generic drug: momelotinib dihydrochloride Take 1 tablet (150 mg total) by mouth daily.   ondansetron 8 MG disintegrating tablet Commonly known as: ZOFRAN-ODT Take 1 tablet (8 mg total) by mouth every 8 (eight) hours as needed for nausea or vomiting.   oxyCODONE 15 MG immediate release tablet Commonly known as: ROXICODONE Take 15 mg by mouth every 6 (six) hours as needed.   pantoprazole 40 MG tablet Commonly known as: PROTONIX TAKE ONE TABLET BY MOUTH 30 TO 60 MINUTES BEFORE YOUR FIRST AND LAST MEALS OF THE DAY   potassium chloride 10 MEQ tablet Commonly  known as: KLOR-CON Take 1 tablet by mouth once daily   pregabalin 75 MG capsule Commonly known as: Lyrica Take 1 capsule (75 mg total) by mouth 2 (two) times daily.   prochlorperazine 10 MG tablet Commonly known as: COMPAZINE Take 1 tablet (10 mg total) by mouth every 6 (six) hours as needed for nausea or vomiting.   temazepam 30 MG capsule Commonly known as: Restoril Take 1 capsule (30 mg total) by mouth at bedtime as needed for sleep.   tiZANidine 4 MG tablet Commonly known as: ZANAFLEX Take 1 tablet (4 mg total) by mouth every 8 (eight) hours as needed for muscle spasms.   vitamin D3 25 MCG tablet Commonly known as: CHOLECALCIFEROL Take 1 tablet (1,000 Units total) by mouth daily.        Follow-up Information     [facility] Follow up.   Why: Physical therapy        [name], MD. Nyra Capes on 08/16/2022.   Specialty: Hematology Why: As scheduled 3:15 pm Contact information: [address] [place]  [place] [address] [phone]                Allergies  Allergen Reactions   Bee Venom Swelling and Hives   Norvasc [Amlodipine] Other (See Comments)    Hair loss   Tylenol [Acetaminophen] Itching   Hydralazine Other (See Comments)    Hair loss   Red Dye Itching   Percocet [Oxycodone-Acetaminophen] Rash and Other (See Comments)    Pt states, "this gives her a rash, but at home she takes oxycodone for pain relief"    Tyloxapol Itching and Rash   Allergies as of 07/29/2022       Reactions   Bee Venom Swelling, Hives   Norvasc [amlodipine] Other (See Comments)   Hair loss   Tylenol [acetaminophen] Itching   Hydralazine Other (See Comments)   Hair loss   Red Dye Itching   Percocet [oxycodone-acetaminophen] Rash, Other (See Comments)   Pt states, "this gives her a rash, but at home she takes oxycodone for pain relief"   Tyloxapol Itching, Rash        Medication List     TAKE these medications    acyclovir 800 MG tablet Commonly known  as: ZOVIRAX Take 1 tablet (800 mg total) by mouth 5 (five) times daily as needed.   Allopurinol 200 MG Tabs Take 200 mg by mouth daily.   amLODipine 10 MG tablet Commonly known as: NORVASC Take 10 mg by mouth every morning.   cetirizine 10 MG tablet Commonly known as: ZYRTEC Take 1 tablet by mouth once daily   clotrimazole 10 MG troche Commonly known as: MYCELEX Take 1 tablet (10 mg total) by mouth 3 (three) times daily for 3 days.   EPINEPHrine 0.3 mg/0.3 mL Soaj injection Commonly known as: EPI-PEN INJECT 0.3 MLS INTO MUSCLE ONCE AS NEEDED   furosemide 20 MG tablet Commonly known as: LASIX TAKE 1 TABLET BY MOUTH ONCE DAILY AS NEEDED   hydrOXYzine 50 MG tablet Commonly known as: ATARAX Take 1 tablet (50 mg total) by mouth 3 (three) times daily as needed.   lidocaine-prilocaine cream Commonly known as: EMLA Apply 1 Application topically as needed (Access to port and pain).   metoprolol tartrate 25 MG tablet Commonly known as: LOPRESSOR Take 1.5 tablets (37.5 mg total) by mouth 2 (two) times daily. What changed:  how much to take additional instructions   mirtazapine 15 MG tablet Commonly known as: REMERON Take 1 tablet (15 mg total) by mouth at bedtime.   multivitamin with minerals Tabs tablet Take 1 tablet by mouth daily.   Ojjaara 150 MG tablet Generic drug: momelotinib dihydrochloride Take 1 tablet (150 mg total) by mouth daily.   ondansetron 8 MG disintegrating tablet Commonly known as: ZOFRAN-ODT Take 1 tablet (8 mg total) by mouth every 8 (eight) hours as needed for nausea or vomiting.   oxyCODONE 15 MG immediate release tablet Commonly known as: ROXICODONE Take 15 mg by mouth every 6 (six) hours as needed.   pantoprazole 40 MG tablet Commonly known as: PROTONIX TAKE ONE TABLET BY MOUTH 30 TO 60 MINUTES BEFORE YOUR FIRST AND LAST MEALS OF THE DAY   potassium chloride 10 MEQ tablet Commonly known as: KLOR-CON Take 1 tablet by mouth once daily    pregabalin 75 MG capsule Commonly known as: Lyrica Take 1 capsule (75 mg total) by mouth 2 (two) times daily.   prochlorperazine 10 MG tablet Commonly known as: COMPAZINE Take 1 tablet (10 mg total) by mouth every 6 (six) hours  as needed for nausea or vomiting.   temazepam 30 MG capsule Commonly known as: Restoril Take 1 capsule (30 mg total) by mouth at bedtime as needed for sleep.   tiZANidine 4 MG tablet Commonly known as: ZANAFLEX Take 1 tablet (4 mg total) by mouth every 8 (eight) hours as needed for muscle spasms.   vitamin D3 25 MCG tablet Commonly known as: CHOLECALCIFEROL Take 1 tablet (1,000 Units total) by mouth daily.        Procedures/Studies: MR Brain Wo Contrast (neuro protocol)  Result Date: 07/26/2022 CLINICAL DATA:  Mental status change with unknown cause EXAM: MRI HEAD WITHOUT CONTRAST TECHNIQUE: Multiplanar, multiecho pulse sequences of the brain and surrounding structures were obtained without intravenous contrast. COMPARISON:  Head CT from 5 days ago FINDINGS: Brain: No acute infarction, hemorrhage, hydrocephalus, extra-axial collection or mass lesion. Generalized atrophy without specific pattern. Mild for age chronic small vessel ischemia in the cerebral white matter. Vascular: Normal flow voids Skull and upper cervical spine: Diffusely T1 hypointense marrow with prominent diffusion signal. There is sclerosis by CT with vague lucency in the right frontal bone that is not detected on this scan. History of myeloproliferative disease per the chart. Sinuses/Orbits: Bilateral cataract resection. IMPRESSION: No acute, reversible, or specific cause for symptoms. Generalized atrophy. Electronically Signed   By: Tiburcio Pea M.D.   On: 07/26/2022 18:18   DG Chest Port 1 View  Result Date: 07/26/2022 CLINICAL DATA:  Weakness EXAM: PORTABLE CHEST 1 VIEW COMPARISON:  X-ray 07/21/2022 FINDINGS: Normal cardiopericardial silhouette. Calcified aorta. No pneumothorax,  effusion or consolidation. Mild basilar atelectasis. No edema. Underlying chronic lung changes. Right IJ chest port with tip along the central SVC above the right atrium. Overlapping cardiac leads. IMPRESSION: Chronic changes.  Chest port. Slight basilar atelectasis. Electronically Signed   By: Karen Kays M.D.   On: 07/26/2022 16:13   MR LUMBAR SPINE WO CONTRAST  Result Date: 07/22/2022 CLINICAL DATA:  Low back pain with cancer suspected. Right leg weakness EXAM: MRI LUMBAR SPINE WITHOUT CONTRAST TECHNIQUE: Multiplanar, multisequence MR imaging of the lumbar spine was performed. No intravenous contrast was administered. COMPARISON:  06/23/2003 FINDINGS: Segmentation:  Standard. Alignment:  L4-5 anterolisthesis, facet mediated. Vertebrae: Diffusely low marrow signal on T1 weighted imaging. No evidence of fracture or focal bone lesion. Conus medullaris and cauda equina: Conus extends to the L2-3 level. Conus and cauda equina appear normal. Paraspinal and other soft tissues: Negative for Perispinal mass or inflammation. Large spleen projecting to the level of the interpolar left kidney. Disc levels: L1-L2: Mild degenerative facet spurring L2-L3: Moderate degenerative facet spurring L3-L4: Mild disc height loss and bulging. Degenerative facet spurring greater on the right. L4-L5: Bulky degenerative facet spurring with anterolisthesis. The disc is narrowed and circumferentially bulging with high-grade left and more mild right foraminal stenosis. Patent spinal canal L5-S1:Disc narrowing with endplate and facet spurring. No neural compression. IMPRESSION: 1. Generalized alteration of marrow signal with splenic enlargement likely related to patient's chart history of myeloproliferative disorder. 2. Lumbar spine degeneration especially affecting facets with L4-5 anterolisthesis. 3. L4-5 left foraminal impingement. No nerve root compression on the symptomatic right side. Electronically Signed   By: Tiburcio Pea M.D.    On: 07/22/2022 20:20   CT Head Wo Contrast  Result Date: 07/21/2022 CLINICAL DATA:  Neck trauma (Age >= 65y); Head trauma, minor (Age >= 65y) EXAM: CT HEAD WITHOUT CONTRAST CT CERVICAL SPINE WITHOUT CONTRAST TECHNIQUE: Multidetector CT imaging of the head and cervical spine was performed  following the standard protocol without intravenous contrast. Multiplanar CT image reconstructions of the cervical spine were also generated. RADIATION DOSE REDUCTION: This exam was performed according to the departmental dose-optimization program which includes automated exposure control, adjustment of the mA and/or kV according to patient size and/or use of iterative reconstruction technique. COMPARISON:  None Available. FINDINGS: CT HEAD FINDINGS Brain: No evidence of acute infarction, hemorrhage, hydrocephalus, extra-axial collection or mass lesion/mass effect. Mild chronic microvascular ischemic change. Vascular: No hyperdense vessel or unexpected calcification. Skull: Nonspecific lucent lesion in the right frontal calvarium, unchanged compared to 09/01/2021. The inner and outer tables of the skull are intact. No acute fracture. Sinuses/Orbits: No middle ear or mastoid effusion. Paranasal sinuses are notable for mucosal thickening in the bilateral ethmoid sinuses. Bilateral lens replacement. Orbits are otherwise unremarkable. Other: None. CT CERVICAL SPINE FINDINGS Alignment: Trace retrolisthesis of C2 on C3. Multilevel degenerative endplate changes, most notably in the upper cervical spine. Skull base and vertebrae: No acute fracture. No primary bone lesion or focal pathologic process. Soft tissues and spinal canal: No prevertebral fluid or swelling. No visible canal hematoma. Disc levels: No evidence of high-grade spinal canal stenosis Upper chest: Negative. Other: None IMPRESSION: 1. No acute intracranial abnormality. 2. No acute fracture or traumatic malalignment of the cervical spine. 3. Nonspecific lucent lesion in  the right frontal calvarium, unchanged compared to 09/01/2021 Electronically Signed   By: Lorenza Cambridge M.D.   On: 07/21/2022 15:14   CT Cervical Spine Wo Contrast  Result Date: 07/21/2022 CLINICAL DATA:  Neck trauma (Age >= 65y); Head trauma, minor (Age >= 65y) EXAM: CT HEAD WITHOUT CONTRAST CT CERVICAL SPINE WITHOUT CONTRAST TECHNIQUE: Multidetector CT imaging of the head and cervical spine was performed following the standard protocol without intravenous contrast. Multiplanar CT image reconstructions of the cervical spine were also generated. RADIATION DOSE REDUCTION: This exam was performed according to the departmental dose-optimization program which includes automated exposure control, adjustment of the mA and/or kV according to patient size and/or use of iterative reconstruction technique. COMPARISON:  None Available. FINDINGS: CT HEAD FINDINGS Brain: No evidence of acute infarction, hemorrhage, hydrocephalus, extra-axial collection or mass lesion/mass effect. Mild chronic microvascular ischemic change. Vascular: No hyperdense vessel or unexpected calcification. Skull: Nonspecific lucent lesion in the right frontal calvarium, unchanged compared to 09/01/2021. The inner and outer tables of the skull are intact. No acute fracture. Sinuses/Orbits: No middle ear or mastoid effusion. Paranasal sinuses are notable for mucosal thickening in the bilateral ethmoid sinuses. Bilateral lens replacement. Orbits are otherwise unremarkable. Other: None. CT CERVICAL SPINE FINDINGS Alignment: Trace retrolisthesis of C2 on C3. Multilevel degenerative endplate changes, most notably in the upper cervical spine. Skull base and vertebrae: No acute fracture. No primary bone lesion or focal pathologic process. Soft tissues and spinal canal: No prevertebral fluid or swelling. No visible canal hematoma. Disc levels: No evidence of high-grade spinal canal stenosis Upper chest: Negative. Other: None IMPRESSION: 1. No acute  intracranial abnormality. 2. No acute fracture or traumatic malalignment of the cervical spine. 3. Nonspecific lucent lesion in the right frontal calvarium, unchanged compared to 09/01/2021 Electronically Signed   By: Lorenza Cambridge M.D.   On: 07/21/2022 15:14   DG Pelvis Portable  Result Date: 07/21/2022 CLINICAL DATA:  Fall last night.  Bilateral leg pain/weakness. EXAM: PORTABLE PELVIS 1-2 VIEWS COMPARISON:  Radiographs 12/09/2021.  Pelvic CT 08/25/2021. FINDINGS: Status post right total hip arthroplasty the hardware appears intact, without evidence of loosening. There is no evidence of acute  fracture or dislocation. No significant arthropathy of the left hip or sacroiliac joints. The soft tissues appear unremarkable. IMPRESSION: No evidence of acute fracture or dislocation. Intact right total hip arthroplasty. Electronically Signed   By: Carey Bullocks M.D.   On: 07/21/2022 14:40   DG Knee Complete 4 Views Right  Result Date: 07/21/2022 CLINICAL DATA:  Bilateral leg pain and weakness since falling last night. EXAM: RIGHT KNEE - COMPLETE 4+ VIEW COMPARISON:  Radiographs 01/05/2016. FINDINGS: The bones appear mildly demineralized. No evidence of acute fracture, dislocation or significant joint effusion. The joint spaces are preserved. There is a stable broad-based osseous excrescence from the lateral aspect of the distal femoral metadiaphysis, consistent with an osteochondroma. IMPRESSION: No evidence of acute right knee injury. Stable distal femoral osteochondroma. Electronically Signed   By: Carey Bullocks M.D.   On: 07/21/2022 14:39   DG Chest Port 1 View  Result Date: 07/21/2022 CLINICAL DATA:  Fall last night. EXAM: PORTABLE CHEST 1 VIEW COMPARISON:  Radiographs 05/25/2022 and 02/08/2022.  CT 06/30/2022. FINDINGS: 1401 hours. Right IJ Port-A-Cath extends to the mid SVC level, stable. The heart size and mediastinal contours are stable. The lungs are clear. There is no pleural effusion or  pneumothorax. No acute osseous findings are evident. Telemetry leads overlie the chest. IMPRESSION: No evidence of acute chest injury. Stable right IJ Port-A-Cath position. Electronically Signed   By: Carey Bullocks M.D.   On: 07/21/2022 14:37   CT Chest Wo Contrast  Result Date: 07/06/2022 CLINICAL DATA:  Larey Seat 3 weeks ago, right-sided chest pain, mild cough, history of myeloproliferative disorder EXAM: CT CHEST WITHOUT CONTRAST TECHNIQUE: Multidetector CT imaging of the chest was performed following the standard protocol without IV contrast. RADIATION DOSE REDUCTION: This exam was performed according to the departmental dose-optimization program which includes automated exposure control, adjustment of the mA and/or kV according to patient size and/or use of iterative reconstruction technique. COMPARISON:  02/08/2022, 11/10/2021 FINDINGS: Cardiovascular: Unenhanced imaging of the heart is unremarkable without pericardial effusion. Calcification of the aortic valve. Normal caliber of the thoracic aorta. There is atherosclerosis of the aorta and coronary vasculature. Right chest wall port via internal jugular approach, tip within the superior vena cava. Evaluation of the vascular lumen is limited without IV contrast. Mediastinum/Nodes: No enlarged mediastinal or axillary lymph nodes. Thyroid gland, trachea, and esophagus demonstrate no significant findings. Lungs/Pleura: Stable upper lobe predominant emphysema. No acute airspace disease, effusion, or pneumothorax. Upper Abdomen: Spleen is enlarged. No other acute upper abdominal findings. Musculoskeletal: There are subacute healing right lateral sixth and seventh rib fractures, consistent with history of trauma 3 weeks ago. Please correlate with site of patient's pain. No other acute bony abnormalities. Multilevel thoracic spondylosis again noted. Reconstructed images demonstrate no additional findings. IMPRESSION: 1. Subacute healing right lateral sixth and  seventh rib fractures, consistent with history of trauma 3 weeks ago. Please correlate with site of patient's pain. 2. Otherwise no acute intrathoracic process. 3. Aortic Atherosclerosis (ICD10-I70.0) and Emphysema (ICD10-J43.9). 4. Splenomegaly. Electronically Signed   By: Sharlet Salina M.D.   On: 07/06/2022 13:55     Subjective: No specific complaints   Discharge Exam: Vitals:   07/29/22 0843 07/29/22 0845  BP: (!) 186/79 (!) 186/79  Pulse: 70   Resp:    Temp: 98.6 F (37 C)   SpO2: 100%    Vitals:   07/28/22 1453 07/28/22 2003 07/29/22 0843 07/29/22 0845  BP: (!) 142/68 (!) 153/70 (!) 186/79 (!) 186/79  Pulse: 69 65  70   Resp: 17     Temp: 98.6 F (37 C) 98.9 F (37.2 C) 98.6 F (37 C)   TempSrc: Oral Oral Oral   SpO2: 100% 100% 100%   Weight:      Height:       General: Pt is alert, awake, not in acute distress Cardiovascular: RRR, S1/S2 +, no rubs, no gallops Respiratory: CTA bilaterally, no wheezing, no rhonchi Abdominal: Soft, NT, ND, bowel sounds + Extremities: no edema, no cyanosis   The results of significant diagnostics from this hospitalization (including imaging, microbiology, ancillary and laboratory) are listed below for reference.     Microbiology: Recent Results (from the past 240 hour(s))  Urine Culture (for pregnant, neutropenic or urologic patients or patients with an indwelling urinary catheter)     Status: None   Collection Time: 07/21/22  2:05 PM   Specimen: Urine, Clean Catch  Result Value Ref Range Status   Specimen Description   Final    URINE, CLEAN CATCH Performed at [facility], [address], [place], [place] [address]    Special Requests   Final    NONE Performed at [facility], [address] [place], [place] [address]    Culture   Final    NO GROWTH Performed at [facility], [address] [place], [place] [address]    Report Status 07/22/2022 FINAL  Final  SARS Coronavirus 2 by RT PCR (hospital order, performed in  [facility] hospital lab) *cepheid single result test* Anterior Nasal Swab     Status: None   Collection Time: 07/21/22  6:23 PM   Specimen: Anterior Nasal Swab  Result Value Ref Range Status   SARS Coronavirus 2 by RT PCR NEGATIVE NEGATIVE Final    Comment: (NOTE) SARS-CoV-2 target nucleic acids are NOT DETECTED.  The SARS-CoV-2 RNA is generally detectable in upper and lower respiratory specimens during the acute phase of infection. The lowest concentration of SARS-CoV-2 viral copies this assay can detect is 250 copies / mL. A negative result does not preclude SARS-CoV-2 infection and should not be used as the sole basis for treatment or other patient management decisions.  A negative result may occur with improper specimen collection / handling, submission of specimen other than nasopharyngeal swab, presence of viral mutation(s) within the areas targeted by this assay, and inadequate number of viral copies (<250 copies / mL). A negative result must be combined with clinical observations, patient history, and epidemiological information.  Fact Sheet for Patients:   RoadLapTop.co.za  Fact Sheet for Healthcare Providers: http://kim-miller.com/  This test is not yet approved or  cleared by the [place] FDA and has been authorized for detection and/or diagnosis of SARS-CoV-2 by FDA under an Emergency Use Authorization (EUA).  This EUA will remain in effect (meaning this test can be used) for the duration of the COVID-19 declaration under Section 564(b)(1) of the Act, 21 U.S.C. section 360bbb-3(b)(1), unless the authorization is terminated or revoked sooner.  Performed at [facility], [address] [place], [place] [address]   Culture, blood (Routine X 2) w Reflex to ID Panel     Status: Abnormal   Collection Time: 07/21/22  7:20 PM   Specimen: BLOOD  Result Value Ref Range Status   Specimen Description   Final    BLOOD BLOOD  LEFT HAND Performed at [facility], [address] [place], [place] [address]    Special Requests   Final    BOTTLES DRAWN AEROBIC  AND ANAEROBIC Blood Culture adequate volume Performed at [facility], [address], [place], [place] [address]    Culture  Setup Time   Final    ANAEROBIC BOTTLE ONLY GRAM POSITIVE RODS Gram Stain Report Called to,Read Back By and Verified With: TAT,DAVID @ 0853 ON 07/26/2022 BY FRATTO,ASHLEY Performed at [facility], [address], [place], [place] [address]    Culture (A)  Final    PROPIONIBACTERIUM ACNES Standardized susceptibility testing for this organism is not available. Performed at [facility], [address], [place], [place] [address]    Report Status 07/28/2022 FINAL  Final  Culture, blood (Routine X 2) w Reflex to ID Panel     Status: None   Collection Time: 07/21/22  7:20 PM   Specimen: BLOOD  Result Value Ref Range Status   Specimen Description BLOOD BLOOD RIGHT HAND  Final   Special Requests   Final    BOTTLES DRAWN AEROBIC AND ANAEROBIC Blood Culture adequate volume   Culture   Final    NO GROWTH 5 DAYS Performed at [facility], [address] [place], [place] [address]    Report Status 07/26/2022 FINAL  Final  Culture, blood (Routine X 2) w Reflex to ID Panel     Status: None (Preliminary result)   Collection Time: 07/26/22  5:50 PM   Specimen: BLOOD  Result Value Ref Range Status   Specimen Description BLOOD PORTA CATH  Final   Special Requests   Final    BOTTLES DRAWN AEROBIC AND ANAEROBIC Blood Culture results may not be optimal due to an excessive volume of blood received in culture bottles   Culture   Final    NO GROWTH 3 DAYS Performed at [facility], [address] [place], [place] [address]    Report Status PENDING  Incomplete  Culture, blood (Routine X 2) w Reflex to ID Panel     Status: None (Preliminary result)   Collection Time: 07/26/22  6:02 PM   Specimen: BLOOD  Result Value Ref Range Status    Specimen Description BLOOD BLOOD LEFT ARM  Final   Special Requests   Final    BOTTLES DRAWN AEROBIC AND ANAEROBIC Blood Culture adequate volume   Culture   Final    NO GROWTH 3 DAYS Performed at [facility], [address] [place], [place] [address]    Report Status PENDING  Incomplete     Labs: BNP (last 3 results) Recent Labs    09/02/21 1622 11/10/21 1830  BNP 569.0* 168.7*   Basic Metabolic Panel: Recent Labs  Lab 07/23/22 0425 07/23/22 1423 07/26/22 1647 07/27/22 0528 07/29/22 0432  NA 132* 130* 138 140 137  K 5.2* 4.3 4.1 4.5 4.0  CL 110 107 111 115* 110  CO2 20* 17* 18* 19* 20*  GLUCOSE 98 96 87 107* 105*  BUN 24* 26* 46* 36* 20  CREATININE 1.48* 1.57* 2.15* 1.58* 1.22*  CALCIUM 8.3* 8.3* 8.4* 8.1* 8.7*  MG 1.5*  --   --   --   --    Liver Function Tests: Recent Labs  Lab 07/23/22 0425 07/26/22 1647  AST 33 47*  ALT 26 30  ALKPHOS 103 115  BILITOT 1.1 0.7  PROT 6.6 7.9  ALBUMIN 3.1* 3.7   No results for input(s): "LIPASE", "AMYLASE" in the last 168 hours. No results for input(s): "AMMONIA" in the last 168 hours. CBC: Recent Labs  Lab 07/23/22 0425 07/26/22 1647 07/27/22 0528  WBC 49.7*  50.2* 54.3*  NEUTROABS 38.3* 32.6*  --   HGB 7.4* 8.1* 7.7*  HCT 23.3* 25.6* 24.6*  MCV 96.7 97.0 97.2  PLT 101* 109* 124*   Cardiac Enzymes: Recent Labs  Lab 07/22/22 1647  CKTOTAL 146   BNP: Invalid input(s): "POCBNP" CBG: Recent Labs  Lab 07/26/22 1459  GLUCAP 104*   D-Dimer No results for input(s): "DDIMER" in the last 72 hours. Hgb A1c No results for input(s): "HGBA1C" in the last 72 hours. Lipid Profile No results for input(s): "CHOL", "HDL", "LDLCALC", "TRIG", "CHOLHDL", "LDLDIRECT" in the last 72 hours. Thyroid function studies No results for input(s): "TSH", "T4TOTAL", "T3FREE", "THYROIDAB" in the last 72 hours.  Invalid input(s): "FREET3" Anemia work up No results for input(s): "VITAMINB12", "FOLATE", "FERRITIN", "TIBC", "IRON",  "RETICCTPCT" in the last 72 hours. Urinalysis    Component Value Date/Time   COLORURINE STRAW (A) 07/26/2022 1947   APPEARANCEUR CLEAR 07/26/2022 1947   APPEARANCEUR Cloudy (A) 05/17/2022 1325   LABSPEC 1.006 07/26/2022 1947   PHURINE 5.0 07/26/2022 1947   GLUCOSEU NEGATIVE 07/26/2022 1947   GLUCOSEU NEG mg/dL 16/11/9602 5409   HGBUR MODERATE (A) 07/26/2022 1947   HGBUR negative 08/19/2008 1124   BILIRUBINUR NEGATIVE 07/26/2022 1947   BILIRUBINUR Negative 05/17/2022 1325   KETONESUR NEGATIVE 07/26/2022 1947   PROTEINUR NEGATIVE 07/26/2022 1947   UROBILINOGEN 0.2 03/14/2022 1443   UROBILINOGEN 0.2 12/05/2012 1150   NITRITE NEGATIVE 07/26/2022 1947   LEUKOCYTESUR NEGATIVE 07/26/2022 1947   Sepsis Labs Recent Labs  Lab 07/23/22 0425 07/26/22 1647 07/27/22 0528  WBC 49.7* 50.2* 54.3*   Microbiology Recent Results (from the past 240 hour(s))  Urine Culture (for pregnant, neutropenic or urologic patients or patients with an indwelling urinary catheter)     Status: None   Collection Time: 07/21/22  2:05 PM   Specimen: Urine, Clean Catch  Result Value Ref Range Status   Specimen Description   Final    URINE, CLEAN CATCH Performed at [facility], [address], [place], [place] [address]    Special Requests   Final    NONE Performed at [facility], [address], [place], [place] [address]    Culture   Final    NO GROWTH Performed at [facility], [address], [place], [place] [address]    Report Status 07/22/2022 FINAL  Final  SARS Coronavirus 2 by RT PCR (hospital order, performed in [facility] hospital lab) *cepheid single result test* Anterior Nasal Swab     Status: None   Collection Time: 07/21/22  6:23 PM   Specimen: Anterior Nasal Swab  Result Value Ref Range Status   SARS Coronavirus 2 by RT PCR NEGATIVE NEGATIVE Final    Comment: (NOTE) SARS-CoV-2 target nucleic acids are NOT DETECTED.  The SARS-CoV-2 RNA is generally detectable in upper and  lower respiratory specimens during the acute phase of infection. The lowest concentration of SARS-CoV-2 viral copies this assay can detect is 250 copies / mL. A negative result does not preclude SARS-CoV-2 infection and should not be used as the sole basis for treatment or other patient management decisions.  A negative result may occur with improper specimen collection / handling, submission of specimen other than nasopharyngeal swab, presence of viral mutation(s) within the areas targeted by this assay, and inadequate number of viral copies (<250 copies / mL). A negative result must be combined with clinical observations, patient history, and epidemiological information.  Fact Sheet for Patients:   RoadLapTop.co.za  Fact Sheet  for Healthcare Providers: http://kim-miller.com/  This test is not yet approved or  cleared by the [place] and has been authorized for detection and/or diagnosis of SARS-CoV-2 by FDA under an Emergency Use Authorization (EUA).  This EUA will remain in effect (meaning this test can be used) for the duration of the COVID-19 declaration under Section 564(b)(1) of the Act, 21 U.S.C. section 360bbb-3(b)(1), unless the authorization is terminated or revoked sooner.  Performed at [facility], [address] [place], [place] [address]   Culture, blood (Routine X 2) w Reflex to ID Panel     Status: Abnormal   Collection Time: 07/21/22  7:20 PM   Specimen: BLOOD  Result Value Ref Range Status   Specimen Description   Final    BLOOD BLOOD LEFT HAND Performed at [facility], [address], [place], [place] [address]    Special Requests   Final    BOTTLES DRAWN AEROBIC AND ANAEROBIC Blood Culture adequate volume Performed at [facility], [address] [place], [place] [address]    Culture  Setup Time   Final    ANAEROBIC BOTTLE ONLY GRAM POSITIVE RODS Gram Stain Report Called to,Read Back By and Verified  With: TAT,DAVID @ 0853 ON 07/26/2022 BY FRATTO,ASHLEY Performed at [facility], [address] [place], [place] [address]    Culture (A)  Final    PROPIONIBACTERIUM ACNES Standardized susceptibility testing for this organism is not available. Performed at [facility], [address] [place], [place] [address]    Report Status 07/28/2022 FINAL  Final  Culture, blood (Routine X 2) w Reflex to ID Panel     Status: None   Collection Time: 07/21/22  7:20 PM   Specimen: BLOOD  Result Value Ref Range Status   Specimen Description BLOOD BLOOD RIGHT HAND  Final   Special Requests   Final    BOTTLES DRAWN AEROBIC AND ANAEROBIC Blood Culture adequate volume   Culture   Final    NO GROWTH 5 DAYS Performed at [facility], [address] [place], [place] [address]    Report Status 07/26/2022 FINAL  Final  Culture, blood (Routine X 2) w Reflex to ID Panel     Status: None (Preliminary result)   Collection Time: 07/26/22  5:50 PM   Specimen: BLOOD  Result Value Ref Range Status   Specimen Description BLOOD PORTA CATH  Final   Special Requests   Final    BOTTLES DRAWN AEROBIC AND ANAEROBIC Blood Culture results may not be optimal due to an excessive volume of blood received in culture bottles   Culture   Final    NO GROWTH 3 DAYS Performed at [facility], [address] [place], [place] [address]    Report Status PENDING  Incomplete  Culture, blood (Routine X 2) w Reflex to ID Panel     Status: None (Preliminary result)   Collection Time: 07/26/22  6:02 PM   Specimen: BLOOD  Result Value Ref Range Status   Specimen Description BLOOD BLOOD LEFT ARM  Final   Special Requests   Final    BOTTLES DRAWN AEROBIC AND ANAEROBIC Blood Culture adequate volume   Culture   Final    NO GROWTH 3 DAYS Performed at [facility], [address] [place], [place] [address]    Report Status PENDING  Incomplete    Time coordinating discharge:   SIGNED:  Standley Dakins, MD  [facility] 07/29/2022, 10:32 AM How to contact the  [facility] Attending or Consulting provider [place] or covering provider during after hours [place], for this patient?  Check the care team in [facility] and look for a) attending/consulting TR[facility] provider listed and b) the [facility] team listed Log into [email or web address] and use [place]'s universal password to access. If you do not have the password, please contact the hospital operator. Locate the [facility] provider you are looking for under [facility] and page to a number that you can be directly reached. If you still have difficulty reaching the provider, please page the [facility] (Director on Call) for the Hospitalists listed on amion for assistance.

## 2022-07-31 LAB — CULTURE, BLOOD (ROUTINE X 2): Special Requests: ADEQUATE

## 2022-08-01 ENCOUNTER — Telehealth: Payer: Self-pay

## 2022-08-01 NOTE — Transitions of Care (Post Inpatient/ED Visit) (Signed)
08/01/2022  Name: Renee Waters MRN: [id number] DOB: 11/20/1945  Today's TOC FU Call Status: TOC FU Call Complete Date: 08/01/22  Transition Care Management Follow-up Telephone Call Date of Discharge: 07/29/22 [facility] Type of Discharge: Inpatient Admission Primary Inpatient Discharge Diagnosis:: Sepsis How have you been since you were released from the hospital?: Better Any questions or concerns?: No  Items Reviewed: Did you receive and understand the discharge instructions provided?: Yes Medications obtained,verified, and reconciled?: Partial Review Completed Reason for Partial Mediation Review: Only able to discuss a few medications Any new allergies since your discharge?: No Dietary orders reviewed?: No Do you have support at home?: Yes People in Home: child(ren), adult, spouse Name of Support/Comfort Primary Source: [name]  Medications Reviewed Today: Medications Reviewed Today     Reviewed by Jodelle Gross, RN (Case Manager) on 08/01/22 at 1145  Med List Status: <None>   Medication Order Taking? Sig Documenting Provider Last Dose Status Informant  acyclovir (ZOVIRAX) 800 MG tablet [id number]  Take 1 tablet (800 mg total) by mouth 5 (five) times daily as needed. Cleora Fleet, MD  Active Self  Allopurinol 200 MG TABS [id number] Yes Take 200 mg by mouth daily. [name], MD Taking Active Self  amLODipine (NORVASC) 10 MG tablet [id number] Yes Take 10 mg by mouth every morning. [provider] Taking Active Self  cetirizine (ZYRTEC) 10 MG tablet [id number]  Take 1 tablet by mouth once daily Kerri Perches, MD  Active Self  cholecalciferol (CHOLECALCIFEROL) 25 MCG tablet [id number]  Take 1 tablet (1,000 Units total) by mouth daily. Catarina Hartshorn, MD  Active Self  clotrimazole [facility]) 10 MG troche [id number] Yes Take 1 tablet (10 mg total) by mouth 3 (three) times daily for 3 days. Cleora Fleet, MD Taking Active    EPINEPHrine 0.3 mg/0.3 mL IJ SOAJ injection [id number]  INJECT 0.3 MLS INTO MUSCLE ONCE AS NEEDED Gardenia Phlegm, MD  Active Self  furosemide (LASIX) 20 MG tablet [id number] Yes TAKE 1 TABLET BY MOUTH ONCE DAILY AS NEEDED [name], MD Taking Active Self  hydrOXYzine (ATARAX) 50 MG tablet [id number] Yes Take 1 tablet (50 mg total) by mouth 3 (three) times daily as needed. [name], MD Taking Active Self  lanreotide acetate (SOMATULINE DEPOT) 120 MG/0.5ML injection [id number]     Active   lanreotide acetate (SOMATULINE DEPOT) 120 MG/0.5ML injection [id number]     Active   lanreotide acetate (SOMATULINE DEPOT) 120 MG/0.5ML injection [id number]     Active   lidocaine-prilocaine (EMLA) cream [id number]  Apply 1 Application topically as needed (Access to port and pain). [name], MD  Active Self  metoprolol tartrate (LOPRESSOR) 25 MG tablet [id number] Yes Take 1.5 tablets (37.5 mg total) by mouth 2 (two) times daily. Cleora Fleet, MD Taking Active   mirtazapine (REMERON) 15 MG tablet [id number]  Take 1 tablet (15 mg total) by mouth at bedtime. Kerri Perches, MD  Active Self  momelotinib dihydrochloride [facility]) 150 MG tablet [id number]  Take 1 tablet (150 mg total) by mouth daily. [name], MD  Active Self  Multiple Vitamin (MULTIVITAMIN WITH MINERALS) TABS tablet [id number]  Take 1 tablet by mouth daily. [provider]  Active Self  octreotide (SANDOSTATIN LAR) 30 MG IM injection [id number]     Active   octreotide (SANDOSTATIN LAR) 30 MG IM injection [id number]     Active   ondansetron (ZOFRAN-ODT) 8 MG disintegrating tablet [id number]  Take 1 tablet (8 mg total) by  mouth every 8 (eight) hours as needed for nausea or vomiting. [name], MD  Active Self  oxyCODONE (ROXICODONE) 15 MG immediate release tablet [id number]  Take 15 mg by mouth every 6 (six) hours as needed. [provider]  Active Self  pantoprazole (PROTONIX) 40 MG  tablet [id number]  TAKE ONE TABLET BY MOUTH 30 TO 60 MINUTES BEFORE YOUR FIRST AND LAST MEALS OF THE DAY Nyoka Cowden, MD  Active Self  potassium chloride (KLOR-CON) 10 MEQ tablet [id number]  Take 1 tablet by mouth once daily [name], MD  Active Self, Child  pregabalin (LYRICA) 75 MG capsule [id number]  Take 1 capsule (75 mg total) by mouth 2 (two) times daily. [name], MD  Active Self  prochlorperazine (COMPAZINE) 10 MG tablet [id number]  Take 1 tablet (10 mg total) by mouth every 6 (six) hours as needed for nausea or vomiting. [name], MD  Active Self, Child  temazepam (RESTORIL) 30 MG capsule [id number]  Take 1 capsule (30 mg total) by mouth at bedtime as needed for sleep. Kerri Perches, MD  Active Self  tiZANidine (ZANAFLEX) 4 MG tablet [id number]  Take 1 tablet (4 mg total) by mouth every 8 (eight) hours as needed for muscle spasms. Kerri Perches, MD  Active Self  Med List Note Lorriane Shire, CPhT 02/22/22 1312): [place] filled at [facility] and Equipment/Supplies: Were Home Health Services Ordered?: Yes [facility] of [facility] Has Agency set up a time to come to your home?: No EMR reviewed for Home Health Orders: Orders present/patient has not received call (refer to CM for follow-up) Any new equipment or medical supplies ordered?: No  Functional Questionnaire: Do you need assistance with bathing/showering or dressing?: No Do you need assistance with meal preparation?: No Do you need assistance with eating?: No Do you have difficulty maintaining continence: No Do you need assistance with getting out of bed/getting out of a chair/moving?: No Do you have difficulty managing or taking your medications?: No  Follow up appointments reviewed: PCP Follow-up appointment confirmed?: Yes Date of PCP follow-up appointment?: 08/10/22 Follow-up Provider: Gilmore Laroche, NP Specialist  Hospital Follow-up appointment confirmed?: Yes Date of Specialist follow-up appointment?: 08/16/22 Follow-Up Specialty Provider:: Dr. Ellin Saba Do you need transportation to your follow-up appointment?: No Do you understand care options if your condition(s) worsen?: Yes-patient verbalized understanding  SDOH Interventions Today    Flowsheet Row Most Recent Value  SDOH Interventions   Food Insecurity Interventions Intervention Not Indicated  Housing Interventions Intervention Not Indicated  Transportation Interventions Intervention Not Indicated  Utilities Interventions Intervention Not Indicated      Interventions Today    Flowsheet Row Most Recent Value  Chronic Disease   Chronic disease during today's visit Hypertension (HTN), Chronic Kidney Disease/End Stage Renal Disease (ESRD)  General Interventions   General Interventions Discussed/Reviewed General Interventions Discussed       TOC Interventions Today    Flowsheet Row Most Recent Value  TOC Interventions   TOC Interventions Discussed/Reviewed TOC Interventions Discussed, Contacted Home Health RN/OT/PT       Jodelle Gross, RN, BSN, CCM Care Management Coordinator [place] Phone: [phone]/Fax: [phone]

## 2022-08-02 ENCOUNTER — Ambulatory Visit: Payer: Medicare Other | Admitting: Family Medicine

## 2022-08-04 ENCOUNTER — Other Ambulatory Visit: Payer: Self-pay

## 2022-08-04 DIAGNOSIS — D649 Anemia, unspecified: Secondary | ICD-10-CM

## 2022-08-04 DIAGNOSIS — D471 Chronic myeloproliferative disease: Secondary | ICD-10-CM

## 2022-08-05 ENCOUNTER — Other Ambulatory Visit (HOSPITAL_COMMUNITY): Payer: Self-pay

## 2022-08-05 ENCOUNTER — Other Ambulatory Visit: Payer: Self-pay

## 2022-08-08 ENCOUNTER — Other Ambulatory Visit: Payer: Self-pay

## 2022-08-08 ENCOUNTER — Inpatient Hospital Stay: Payer: Medicare Other | Attending: Hematology

## 2022-08-08 VITALS — BP 92/60 | HR 52 | Temp 97.3°F | Resp 18

## 2022-08-08 DIAGNOSIS — D471 Chronic myeloproliferative disease: Secondary | ICD-10-CM

## 2022-08-08 DIAGNOSIS — D649 Anemia, unspecified: Secondary | ICD-10-CM

## 2022-08-08 DIAGNOSIS — Z87891 Personal history of nicotine dependence: Secondary | ICD-10-CM | POA: Diagnosis not present

## 2022-08-08 DIAGNOSIS — N183 Chronic kidney disease, stage 3 unspecified: Secondary | ICD-10-CM | POA: Diagnosis not present

## 2022-08-08 DIAGNOSIS — I129 Hypertensive chronic kidney disease with stage 1 through stage 4 chronic kidney disease, or unspecified chronic kidney disease: Secondary | ICD-10-CM | POA: Diagnosis not present

## 2022-08-08 DIAGNOSIS — M3214 Glomerular disease in systemic lupus erythematosus: Secondary | ICD-10-CM | POA: Diagnosis not present

## 2022-08-08 LAB — CBC WITH DIFFERENTIAL/PLATELET
Abs Immature Granulocytes: 3.6 10*3/uL — ABNORMAL HIGH (ref 0.00–0.07)
Band Neutrophils: 10 %
Basophils Absolute: 0 10*3/uL (ref 0.0–0.1)
Basophils Relative: 0 %
Blasts: 2 %
Eosinophils Absolute: 0.4 10*3/uL (ref 0.0–0.5)
Eosinophils Relative: 1 %
HCT: 23 % — ABNORMAL LOW (ref 36.0–46.0)
Hemoglobin: 7.2 g/dL — ABNORMAL LOW (ref 12.0–15.0)
Lymphocytes Relative: 22 %
Lymphs Abs: 9.9 10*3/uL — ABNORMAL HIGH (ref 0.7–4.0)
MCH: 30.6 pg (ref 26.0–34.0)
MCHC: 31.3 g/dL (ref 30.0–36.0)
MCV: 97.9 fL (ref 80.0–100.0)
Metamyelocytes Relative: 7 %
Monocytes Absolute: 3.6 10*3/uL — ABNORMAL HIGH (ref 0.1–1.0)
Monocytes Relative: 8 %
Myelocytes: 1 %
Neutro Abs: 26.5 10*3/uL — ABNORMAL HIGH (ref 1.7–7.7)
Neutrophils Relative %: 49 %
Platelets: 119 10*3/uL — ABNORMAL LOW (ref 150–400)
RBC: 2.35 MIL/uL — ABNORMAL LOW (ref 3.87–5.11)
RDW: 21.4 % — ABNORMAL HIGH (ref 11.5–15.5)
WBC: 44.9 10*3/uL — ABNORMAL HIGH (ref 4.0–10.5)
nRBC: 3.8 % — ABNORMAL HIGH (ref 0.0–0.2)
nRBC: 5 /100 WBC — ABNORMAL HIGH

## 2022-08-08 LAB — COMPREHENSIVE METABOLIC PANEL
ALT: 19 U/L (ref 0–44)
AST: 25 U/L (ref 15–41)
Albumin: 3.4 g/dL — ABNORMAL LOW (ref 3.5–5.0)
Alkaline Phosphatase: 92 U/L (ref 38–126)
Anion gap: 8 (ref 5–15)
BUN: 37 mg/dL — ABNORMAL HIGH (ref 8–23)
CO2: 20 mmol/L — ABNORMAL LOW (ref 22–32)
Calcium: 8.2 mg/dL — ABNORMAL LOW (ref 8.9–10.3)
Chloride: 109 mmol/L (ref 98–111)
Creatinine, Ser: 1.98 mg/dL — ABNORMAL HIGH (ref 0.44–1.00)
GFR, Estimated: 26 mL/min — ABNORMAL LOW (ref >60)
Glucose, Bld: 118 mg/dL — ABNORMAL HIGH (ref 70–99)
Potassium: 3.8 mmol/L (ref 3.5–5.1)
Sodium: 137 mmol/L (ref 135–145)
Total Bilirubin: 0.8 mg/dL (ref 0.3–1.2)
Total Protein: 7.3 g/dL (ref 6.5–8.1)

## 2022-08-08 LAB — MAGNESIUM: Magnesium: 1.9 mg/dL (ref 1.7–2.4)

## 2022-08-08 LAB — SAMPLE TO BLOOD BANK

## 2022-08-08 MED ORDER — SODIUM CHLORIDE 0.9% FLUSH
10.0000 mL | Freq: Once | INTRAVENOUS | Status: AC
  Administered 2022-08-08: 10 mL via INTRAVENOUS

## 2022-08-08 MED ORDER — HEPARIN SOD (PORK) LOCK FLUSH 100 UNIT/ML IV SOLN
500.0000 [IU] | Freq: Once | INTRAVENOUS | Status: AC
  Administered 2022-08-08: 500 [IU] via INTRAVENOUS

## 2022-08-08 NOTE — Progress Notes (Signed)
Patients port flushed without difficulty.  Good blood return noted with no bruising or swelling noted at site.  Band aid applied.  VSS with discharge and left in satisfactory condition with no s/s of distress noted.   

## 2022-08-08 NOTE — Patient Instructions (Signed)
[facility]  Discharge Instructions: Thank you for choosing [facility] to provide your oncology and hematology care.  If you have a lab appointment with the Cancer Center - please note that after April 8th, 2024, all labs will be drawn in the cancer center.  You do not have to check in or register with the main entrance as you have in the past but will complete your check-in in the cancer center.  Wear comfortable clothing and clothing appropriate for easy access to any Portacath or PICC line.   We strive to give you quality time with your provider. You may need to reschedule your appointment if you arrive late (15 or more minutes).  Arriving late affects you and other patients whose appointments are after yours.  Also, if you miss three or more appointments without notifying the office, you may be dismissed from the clinic at the provider's discretion.      For prescription refill requests, have your pharmacy contact our office and allow 72 hours for refills to be completed.  To help prevent nausea and vomiting after your treatment, we encourage you to take your nausea medication as directed.  BELOW ARE SYMPTOMS THAT SHOULD BE REPORTED IMMEDIATELY: *FEVER GREATER THAN 100.4 F (38 C) OR HIGHER *CHILLS OR SWEATING *NAUSEA AND VOMITING THAT IS NOT CONTROLLED WITH YOUR NAUSEA MEDICATION *UNUSUAL SHORTNESS OF BREATH *UNUSUAL BRUISING OR BLEEDING *URINARY PROBLEMS (pain or burning when urinating, or frequent urination) *BOWEL PROBLEMS (unusual diarrhea, constipation, pain near the anus) TENDERNESS IN MOUTH AND THROAT WITH OR WITHOUT PRESENCE OF ULCERS (sore throat, sores in mouth, or a toothache) UNUSUAL RASH, SWELLING OR PAIN  UNUSUAL VAGINAL DISCHARGE OR ITCHING   Items with * indicate a potential emergency and should be followed up as soon as possible or go to the Emergency Department if any problems should occur.  Please show the CHEMOTHERAPY ALERT CARD or  IMMUNOTHERAPY ALERT CARD at check-in to the Emergency Department and triage nurse.  Should you have questions after your visit or need to cancel or reschedule your appointment, please contact [facility] [phone]  and follow the prompts.  Office hours are 8:00 a.m. to 4:30 p.m. Monday - Friday. Please note that voicemails left after 4:00 p.m. may not be returned until the following business day.  We are closed weekends and major holidays. You have access to a nurse at all times for urgent questions. Please call the main number to the clinic [phone] and follow the prompts.  For any non-urgent questions, you may also contact your provider using MyChart. We now offer e-Visits for anyone 18 and older to request care online for non-urgent symptoms. For details visit [email or web address].   Also download the MyChart app! Go to the app store, search "MyChart", open the app, select , and log in with your MyChart username and password.   

## 2022-08-08 NOTE — Progress Notes (Deleted)
Established Patient Office Visit  Subjective:  Patient ID: Renee Waters, female    DOB: 1945/05/15  Age: 77 y.o. MRN: [id number]  CC: No chief complaint on file.   HPI Renee Waters is a 77 y.o. female with past medical history of CKD 3, hypertension, depression, lupus, HSV-2 presents for f/u of *** chronic medical conditions.   Positive blood culture: the patient was seen in the ED on 07/26/2022 with c/o generalized weakness after a recent discharge for fever of unknown etiology- 100.4, electrolyte abnormalities, hypokalemia, hypomagnesemia, and hypocalcemia. She was treated with empiric Vanco and cefepime, with negative cultures and was discharged, chest x-ray was clear, UA negative for pyuria and COVID was negative. Antibiotics were DC'd on discharge. The patient was called about positive blood cultures -for gram-positive rods-cultures now positive on  in 07/26/22 1 anaerobic bottle that was drawn 6/13. The positive blood culture was thought to be contaminant and repeat culture showed no growth. MRI of he brain was negative for acute findings and the patient was discharged home with home health services.   Past Medical History:  Diagnosis Date   Acute cholangitis    Acute respiratory failure with hypoxia (HCC) 09/01/2021   Allergy    Anemia    Anxiety    Arthritis    Phreesia 03/18/2020   Barrett's esophagus    Cataract    Chronic back pain    Chronic neck pain    CKD (chronic kidney disease) stage 3, GFR 30-59 ml/min (HCC) 10/29/2020   Depression    Genital herpes    GERD (gastroesophageal reflux disease)    H/O degenerative disc disease    History of blood transfusion    Hypertension    Hypoxia 12/01/2021   Insomnia    Lupus (systemic lupus erythematosus) (HCC)    Neuromuscular disorder (HCC)    Osteoarthritis    S/P colonoscopy 07/2003   normal, no polyps   S/P endoscopy June 2005, Oct 2009   2005: short-segment Barrett's, 2009: short-segment Barrett's   Upper  respiratory tract infection 07/09/2020   UTI (lower urinary tract infection) 11/2012    Past Surgical History:  Procedure Laterality Date   ABDOMINAL HYSTERECTOMY     BACK SURGERY     BIOPSY N/A 03/20/2014   Procedure: BIOPSY;  Surgeon: Corbin Ade, MD;  Location: [facility];  Service: Endoscopy;  Laterality: N/A;   BIOPSY  09/14/2015   Procedure: BIOPSY;  Surgeon: Corbin Ade, MD;  Location: [facility];  Service: Endoscopy;;  esophageal and gastric   BIOPSY  07/23/2019   Procedure: BIOPSY;  Surgeon: Corbin Ade, MD;  Location: [facility];  Service: Endoscopy;;  esophageal    CARPAL TUNNEL RELEASE Left 2013   cervical disectomy  2002   CESAREAN SECTION N/A    Phreesia 03/18/2020   CHOLECYSTECTOMY     with lysis of adhesions for sbo; "ruptured gallbladder".   COLONOSCOPY  11/09/2011   RMR: Melanosis coli   COLONOSCOPY WITH PROPOFOL N/A 09/14/2015   Dr. Jena Gauss: diverticulosis, 3mm TA removed. next TCS 09/2020.    COLONOSCOPY WITH PROPOFOL N/A 04/30/2020   Procedure: COLONOSCOPY WITH PROPOFOL;  Surgeon: Corbin Ade, MD;  Location: [facility];  Service: Endoscopy;  Laterality: N/A;  PM (ASA 3)   DENTAL SURGERY  11/2015   multiple tooth extraction   ESOPHAGOGASTRODUODENOSCOPY  11/29/2007   salmon-colored  tongue   longest stable at  3 cm, distal esophagus as described previously status post biopsy/ Hiatal  hernia, otherwise normal stomach D1 and D2   ESOPHAGOGASTRODUODENOSCOPY  01/06/11   short segment Barrett's esophagus s/p bx/Hiatal hernia   ESOPHAGOGASTRODUODENOSCOPY (EGD) WITH PROPOFOL N/A 03/20/2014   [id number] distal esophagus short segment barrett's, bx with no dysplasia. next egd in 03/2017   ESOPHAGOGASTRODUODENOSCOPY (EGD) WITH PROPOFOL N/A 09/14/2015   Dr. Jena Gauss: Barrett's without dysplasia, gastritis benign bx, hiatal hernia. next EGD 09/2018.   ESOPHAGOGASTRODUODENOSCOPY (EGD) WITH PROPOFOL N/A 07/23/2019   Procedure: ESOPHAGOGASTRODUODENOSCOPY (EGD) WITH PROPOFOL;   Surgeon: Corbin Ade, MD;  Location: [facility];  Service: Endoscopy;  Laterality: N/A;  3:00pm   EYE SURGERY N/A    Phreesia 03/18/2020   HERNIA REPAIR Right 07/2010   Dr. Arlean Hopping   IR IMAGING GUIDED PORT INSERTION  02/09/2021   JOINT REPLACEMENT     LAPAROSCOPIC CHOLECYSTECTOMY  2017   at [facility]   POLYPECTOMY  09/14/2015   Procedure: POLYPECTOMY;  Surgeon: Corbin Ade, MD;  Location: [facility];  Service: Endoscopy;;  ascending colon   right hip replacement  07/2010   went back in sept 2012 to fix   SHOULDER ARTHROSCOPY  2008   left   SPINE SURGERY N/A    Phreesia 03/18/2020   TOTAL HIP REVISION Right 12/17/2012   Procedure: RIGHT TOTAL HIP REVISION;  Surgeon: [name], MD;  Location: [facility];  Service: Orthopedics;  Laterality: Right;   WRIST SURGERY Right 2011   open reduction right wrist.    Family History  Problem Relation Age of Onset   Hypertension Mother    Stroke Mother    Colon cancer Neg Hx    Anesthesia problems Neg Hx    Hypotension Neg Hx    Malignant hyperthermia Neg Hx    Pseudochol deficiency Neg Hx    Gastric cancer Neg Hx    Esophageal cancer Neg Hx     Social History   Socioeconomic History   Marital status: Married    Spouse name: louis   Number of children: 4   Years of education: 12+   Highest education level: Some college, no degree  Occupational History   Occupation: Audiological scientist  - retired   Occupation: non profit  Tobacco Use   Smoking status: Former    Packs/day: 0.25    Years: 25.00    Additional pack years: 0.00    Total pack years: 6.25    Types: Cigarettes    Quit date: 02/07/2003    Years since quitting: 19.5   Smokeless tobacco: Never   Tobacco comments:    quit in 2004  Vaping Use   Vaping Use: Never used  Substance and Sexual Activity   Alcohol use: No   Drug use: No   Sexual activity: Not Currently    Birth control/protection: Surgical  Other Topics Concern   Not on file  Social History Narrative    Not on file   Social Determinants of Health   Financial Resource Strain: Low Risk  (12/28/2020)   Overall Financial Resource Strain (CARDIA)    Difficulty of Paying Living Expenses: Not hard at all  Food Insecurity: No Food Insecurity (08/01/2022)   Hunger Vital Sign    Worried About Running Out of Food in the Last Year: Never true    Ran Out of Food in the Last Year: Never true  Transportation Needs: No Transportation Needs (08/01/2022)   PRAPARE - Administrator, Civil Service (Medical): No    Lack of Transportation (Non-Medical): No  Physical  Activity: Inactive (04/23/2021)   Exercise Vital Sign    Days of Exercise per Week: 0 days    Minutes of Exercise per Session: 0 min  Stress: Stress Concern Present (04/23/2021)   [employer] of Occupational Health - Occupational Stress Questionnaire    Feeling of Stress : Rather much  Social Connections: Moderately Integrated (12/28/2020)   Social Connection and Isolation Panel [NHANES]    Frequency of Communication with Friends and Family: More than three times a week    Frequency of Social Gatherings with Friends and Family: Once a week    Attends Religious Services: More than 4 times per year    Active Member of [employer] or Organizations: No    Attends Banker Meetings: Never    Marital Status: Married  Catering manager Violence: Not At Risk (07/26/2022)   Humiliation, Afraid, Rape, and Kick questionnaire    Fear of Current or Ex-Partner: No    Emotionally Abused: No    Physically Abused: No    Sexually Abused: No    Outpatient Medications Prior to Visit  Medication Sig Dispense Refill   acyclovir (ZOVIRAX) 800 MG tablet Take 1 tablet (800 mg total) by mouth 5 (five) times daily as needed. 50 tablet 1   Allopurinol 200 MG TABS Take 200 mg by mouth daily. 30 tablet 3   amLODipine (NORVASC) 10 MG tablet Take 10 mg by mouth every morning.     cetirizine (ZYRTEC) 10 MG tablet Take 1 tablet by mouth once daily  30 tablet 0   cholecalciferol (CHOLECALCIFEROL) 25 MCG tablet Take 1 tablet (1,000 Units total) by mouth daily.     EPINEPHrine 0.3 mg/0.3 mL IJ SOAJ injection INJECT 0.3 MLS INTO MUSCLE ONCE AS NEEDED 1 each 2   furosemide (LASIX) 20 MG tablet TAKE 1 TABLET BY MOUTH ONCE DAILY AS NEEDED 30 tablet 3   hydrOXYzine (ATARAX) 50 MG tablet Take 1 tablet (50 mg total) by mouth 3 (three) times daily as needed. 90 tablet 1   lidocaine-prilocaine (EMLA) cream Apply 1 Application topically as needed (Access to port and pain). 30 g 6   metoprolol tartrate (LOPRESSOR) 25 MG tablet Take 1.5 tablets (37.5 mg total) by mouth 2 (two) times daily. 180 tablet 3   mirtazapine (REMERON) 15 MG tablet Take 1 tablet (15 mg total) by mouth at bedtime. 30 tablet 5   momelotinib dihydrochloride (OJJAARA) 150 MG tablet Take 1 tablet (150 mg total) by mouth daily. 30 tablet 1   Multiple Vitamin (MULTIVITAMIN WITH MINERALS) TABS tablet Take 1 tablet by mouth daily.     ondansetron (ZOFRAN-ODT) 8 MG disintegrating tablet Take 1 tablet (8 mg total) by mouth every 8 (eight) hours as needed for nausea or vomiting. 20 tablet 0   oxyCODONE (ROXICODONE) 15 MG immediate release tablet Take 15 mg by mouth every 6 (six) hours as needed.     pantoprazole (PROTONIX) 40 MG tablet TAKE ONE TABLET BY MOUTH 30 TO 60 MINUTES BEFORE YOUR FIRST AND LAST MEALS OF THE DAY 60 tablet 0   potassium chloride (KLOR-CON) 10 MEQ tablet Take 1 tablet by mouth once daily 30 tablet 4   pregabalin (LYRICA) 75 MG capsule Take 1 capsule (75 mg total) by mouth 2 (two) times daily. 60 capsule 0   prochlorperazine (COMPAZINE) 10 MG tablet Take 1 tablet (10 mg total) by mouth every 6 (six) hours as needed for nausea or vomiting. 60 tablet 1   temazepam (RESTORIL) 30 MG capsule Take 1  capsule (30 mg total) by mouth at bedtime as needed for sleep. 30 capsule 5   tiZANidine (ZANAFLEX) 4 MG tablet Take 1 tablet (4 mg total) by mouth every 8 (eight) hours as needed for  muscle spasms. 30 tablet 0   Facility-Administered Medications Prior to Visit  Medication Dose Route Frequency Provider Last Rate Last Admin   lanreotide acetate (SOMATULINE DEPOT) 120 MG/0.5ML injection            lanreotide acetate (SOMATULINE DEPOT) 120 MG/0.5ML injection            lanreotide acetate (SOMATULINE DEPOT) 120 MG/0.5ML injection            octreotide (SANDOSTATIN LAR) 30 MG IM injection            octreotide (SANDOSTATIN LAR) 30 MG IM injection             Allergies  Allergen Reactions   Bee Venom Swelling and Hives   Norvasc [Amlodipine] Other (See Comments)    Hair loss   Tylenol [Acetaminophen] Itching   Hydralazine Other (See Comments)    Hair loss   Red Dye Itching   Percocet [Oxycodone-Acetaminophen] Rash and Other (See Comments)    Pt states, "this gives her a rash, but at home she takes oxycodone for pain relief"    Tyloxapol Itching and Rash    ROS Review of Systems    Objective:    Physical Exam  There were no vitals taken for this visit. Wt Readings from Last 3 Encounters:  07/26/22 116 lb (52.6 kg)  07/21/22 116 lb (52.6 kg)  07/19/22 116 lb (52.6 kg)    Lab Results  Component Value Date   TSH 5.428 (H) 07/22/2022   Lab Results  Component Value Date   WBC 44.9 (H) 08/08/2022   HGB 7.2 (L) 08/08/2022   HCT 23.0 (L) 08/08/2022   MCV 97.9 08/08/2022   PLT 119 (L) 08/08/2022   Lab Results  Component Value Date   NA 137 08/08/2022   K 3.8 08/08/2022   CO2 20 (L) 08/08/2022   GLUCOSE 118 (H) 08/08/2022   BUN 37 (H) 08/08/2022   CREATININE 1.98 (H) 08/08/2022   BILITOT 0.8 08/08/2022   ALKPHOS 92 08/08/2022   AST 25 08/08/2022   ALT 19 08/08/2022   PROT 7.3 08/08/2022   ALBUMIN 3.4 (L) 08/08/2022   CALCIUM 8.2 (L) 08/08/2022   ANIONGAP 8 08/08/2022   EGFR 35 (L) 10/21/2020   Lab Results  Component Value Date   CHOL 95 (L) 10/05/2020   Lab Results  Component Value Date   HDL 27 (L) 10/05/2020   Lab Results  Component  Value Date   LDLCALC 51 10/05/2020   Lab Results  Component Value Date   TRIG 87 10/05/2020   Lab Results  Component Value Date   CHOLHDL 3.5 10/05/2020   Lab Results  Component Value Date   HGBA1C 6.0 (H) 11/03/2014      Assessment & Plan:  There are no diagnoses linked to this encounter.  Follow-up: No follow-ups on file.   Gilmore Laroche, FNP

## 2022-08-08 NOTE — Progress Notes (Signed)
[facility] [address] [place] [address]    Clinic Day:  08/09/2022  Referring physician: Kerri Perches, MD  Patient Care Team: Kerri Perches, MD as PCP - General Branch, Alben Spittle, MD as PCP - Cardiology (Cardiology) Jena Gauss Gerrit Friends, MD (Gastroenterology) Vickki Hearing, MD as Consulting Physician (Orthopedic Surgery) [name], MD as Medical Oncologist (Oncology) Therese Sarah, RN as Oncology Nurse Navigator (Oncology) Clinton Gallant, RN as [facility] Care Management   ASSESSMENT & PLAN:   Assessment: 1. MPL positive prefibrotic/early primary myelofibrosis: - CBC on 11/06/2020 with white count 75.6, differential 59% neutrophils, 12% monocytes, 4% lymphocytes, 1 percentage of eosinophils and basophils, 6% band neutrophils, 12% myelocytes, 1% promyelocytes, 29% blasts. - Pathologist review of blood smear reported as leukoerythroblastic reaction. - 25 pound weight loss in the last 6 months, unintentional.  Decreased appetite.  Reports fatigue for the last few months.  Reports night sweats x3 in the last 1 month. - Bone marrow biopsy on 11/18/2020 consistent with granulocytic proliferation with differential including CMML versus myeloproliferative disorder. - BCR/ABL was negative. 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 - NGS myeloid panel shows mutations in ASXL1, MPL, TET2, EZH2 mutations. - Spleen ultrasound on 12/16/2020 shows mildly enlarged measuring 12.4 x 9.6 x 7 cm with volume of 435 cc. - PDGFR alpha, beta and FGFR 1 was negative. - She was evaluated by Dr. Lowell Guitar at [facility].  Slides were reviewed at [facility] hematopathology.  They thought it was less likely CMML and more likely MPL positive myeloproliferative neoplasm.  Dr. Lowell Guitar has recommended initiate Jakafi. - Given anemia, B symptoms, cytogenetics with MPL, ASLX1, normal karyotype she is intermediate risk on  GIPPS at high risk MIPSS70+ - Ruxolitinib 10 mg twice daily started on 02/16/2021.  Dose increased to 15 mg twice daily on 05/18/2021. - CTAP on 04/07/2021: Hepatomegaly and splenomegaly measuring 14.3 cm.  No other pathology.  No spleen infarcts. -Ruxolitinib dose increased to 15 mg twice daily on 09/20/2021. - Ultrasound spleen (09/21/2021): 13.6 cm with volume 532 mL.  This is slightly improved from previous volume. - Bone marrow biopsy (09/09/2021): Material is limited but overall features consistent with myeloid neoplasm.  No increase in blasts.  Erythroid precursors appear decreased but megakaryocytes are variably evident with clusters and small forms.  Reticulin's shows variable increase in reticulin fibers including areas with prominent increase.  Chromosome analysis was normal. - Tapering doses of ruxolitinib started on 12/24/2021, last dose on 01/23/2022 - Evaluated by Dr. Lowell Guitar on 12/24/2021. - Momelotinib 100 mg daily started on 01/26/2022, dose increased to 150 mg daily on 07/19/2022 due to generalized itching.  Dose decreased to 100 mg daily on 08/09/2022.  2.  Social/family history: - She lives at home and is able to do all her ADLs and IADLs although she is getting tired lately.  She reports quitting smoking 6 months ago and smoked 1 pack/week for 43 years. - She believes that her mother had some kind of leukemia.  No other malignancies.    Plan: MPL positive myeloproliferative neoplasm: - Momelotinib dose increased to 150 mg daily on 07/19/2022. - She was hospitalized twice since last visit with generalized weakness. - She is reportedly sleeping a lot. - She has not been requiring Atarax or Lyrica since discharge from the hospital on 07/29/2022. - Reviewed labs today: White count 44, platelets 119.  Hemoglobin 7.2.  Will arrange for 1 unit  PRBC. - Recommend increasing momelotinib dose to 100 mg daily due to generalized weakness.  Itching has improved. - RTC 3 weeks for follow-up.  2.   Decreased appetite: - She is eating well and weight is stable.  3.  CKD: - Baseline had been 1.3-1.6.  Today creatinine 1.98.  She will receive 500 mL normal saline.  4.  Hypokalemia: - Continue potassium supplements.  Potassium is normal.   5.  Generalized itching: - Itching has improved while she was in the hospital.  Did not require Lyrica or Atarax since 07/29/2022. - She was taking cetirizine as needed and took once in the last 2 weeks.    No orders of the defined types were placed in this encounter.     I,Katie Daubenspeck,acting as a Neurosurgeon for [name], MD.,have documented all relevant documentation on the behalf of [name], MD,as directed by  [name], MD while in the presence of [name], MD.   I, [name] MD, have reviewed the above documentation for accuracy and completeness, and I agree with the above.   [name], MD   7/2/20247:15 PM  CHIEF COMPLAINT:   Diagnosis: MPL positive myeloproliferative neoplasm    Cancer Staging  No matching staging information was found for the patient.   Prior Therapy: Jakafi 10 mg twice daily   Current Therapy:  Momelotinib 100 mg daily    HISTORY OF PRESENT ILLNESS:   Oncology History   No history exists.     INTERVAL HISTORY:   Renee Waters is a 77 y.o. female presenting to clinic today for follow up of MPL positive myeloproliferative neoplasm. She was last seen by me on 07/19/22.  Since her last visit, she presented to the ED on 07/21/22 following numerous falls. She explained it as feeling like she did not have control over her body movements. Work up scans were negative for any acute findings. She was admitted and treated for hypokalemia/hypomagnesemia and hypocalcemia, as well as fever.   She returned to the ED on 07/26/22 for recurrent weakness and instability. She was again admitted for weakness and fever, as well as acute-on-chronic kidney  injury.  Today, she states that she is doing well overall. Her appetite level is at 100%. Her energy level is at 7 5%.  PAST MEDICAL HISTORY:   Past Medical History: Past Medical History:  Diagnosis Date   Acute cholangitis    Acute respiratory failure with hypoxia (HCC) 09/01/2021   Allergy    Anemia    Anxiety    Arthritis    Phreesia 03/18/2020   Barrett's esophagus    Cataract    Chronic back pain    Chronic neck pain    CKD (chronic kidney disease) stage 3, GFR 30-59 ml/min (HCC) 10/29/2020   Depression    Genital herpes    GERD (gastroesophageal reflux disease)    H/O degenerative disc disease    History of blood transfusion    Hypertension    Hypoxia 12/01/2021   Insomnia    Lupus (systemic lupus erythematosus) (HCC)    Neuromuscular disorder (HCC)    Osteoarthritis    S/P colonoscopy 07/2003   normal, no polyps   S/P endoscopy June 2005, Oct 2009   2005: short-segment Barrett's, 2009: short-segment Barrett's   Upper respiratory tract infection 07/09/2020   UTI (lower urinary tract infection) 11/2012    Surgical History: Past Surgical History:  Procedure Laterality Date   ABDOMINAL HYSTERECTOMY     BACK SURGERY  BIOPSY N/A 03/20/2014   Procedure: BIOPSY;  Surgeon: Corbin Ade, MD;  Location: [facility];  Service: Endoscopy;  Laterality: N/A;   BIOPSY  09/14/2015   Procedure: BIOPSY;  Surgeon: Corbin Ade, MD;  Location: [facility];  Service: Endoscopy;;  esophageal and gastric   BIOPSY  07/23/2019   Procedure: BIOPSY;  Surgeon: Corbin Ade, MD;  Location: [facility];  Service: Endoscopy;;  esophageal    CARPAL TUNNEL RELEASE Left 2013   cervical disectomy  2002   CESAREAN SECTION N/A    Phreesia 03/18/2020   CHOLECYSTECTOMY     with lysis of adhesions for sbo; "ruptured gallbladder".   COLONOSCOPY  11/09/2011   RMR: Melanosis coli   COLONOSCOPY WITH PROPOFOL N/A 09/14/2015   Dr. Jena Gauss: diverticulosis, 3mm TA removed. next TCS 09/2020.     COLONOSCOPY WITH PROPOFOL N/A 04/30/2020   Procedure: COLONOSCOPY WITH PROPOFOL;  Surgeon: Corbin Ade, MD;  Location: [facility];  Service: Endoscopy;  Laterality: N/A;  PM (ASA 3)   DENTAL SURGERY  11/2015   multiple tooth extraction   ESOPHAGOGASTRODUODENOSCOPY  11/29/2007   salmon-colored  tongue   longest stable at  3 cm, distal esophagus as described previously status post biopsy/ Hiatal hernia, otherwise normal stomach D1 and D2   ESOPHAGOGASTRODUODENOSCOPY  01/06/11   short segment Barrett's esophagus s/p bx/Hiatal hernia   ESOPHAGOGASTRODUODENOSCOPY (EGD) WITH PROPOFOL N/A 03/20/2014   [id number] distal esophagus short segment barrett's, bx with no dysplasia. next egd in 03/2017   ESOPHAGOGASTRODUODENOSCOPY (EGD) WITH PROPOFOL N/A 09/14/2015   Dr. Jena Gauss: Barrett's without dysplasia, gastritis benign bx, hiatal hernia. next EGD 09/2018.   ESOPHAGOGASTRODUODENOSCOPY (EGD) WITH PROPOFOL N/A 07/23/2019   Procedure: ESOPHAGOGASTRODUODENOSCOPY (EGD) WITH PROPOFOL;  Surgeon: Corbin Ade, MD;  Location: [facility];  Service: Endoscopy;  Laterality: N/A;  3:00pm   EYE SURGERY N/A    Phreesia 03/18/2020   HERNIA REPAIR Right 07/2010   Dr. Arlean Hopping   IR IMAGING GUIDED PORT INSERTION  02/09/2021   JOINT REPLACEMENT     LAPAROSCOPIC CHOLECYSTECTOMY  2017   at [facility]   POLYPECTOMY  09/14/2015   Procedure: POLYPECTOMY;  Surgeon: Corbin Ade, MD;  Location: [facility];  Service: Endoscopy;;  ascending colon   right hip replacement  07/2010   went back in sept 2012 to fix   SHOULDER ARTHROSCOPY  2008   left   SPINE SURGERY N/A    Phreesia 03/18/2020   TOTAL HIP REVISION Right 12/17/2012   Procedure: RIGHT TOTAL HIP REVISION;  Surgeon: [name], MD;  Location: [facility];  Service: Orthopedics;  Laterality: Right;   WRIST SURGERY Right 2011   open reduction right wrist.    Social History: Social History   Socioeconomic History   Marital status: Married    Spouse name:  louis   Number of children: 4   Years of education: 12+   Highest education level: Some college, no degree  Occupational History   Occupation: Audiological scientist  - retired   Occupation: non profit  Tobacco Use   Smoking status: Former    Packs/day: 0.25    Years: 25.00    Additional pack years: 0.00    Total pack years: 6.25    Types: Cigarettes    Quit date: 02/07/2003    Years since quitting: 19.5   Smokeless tobacco: Never   Tobacco comments:    quit in 2004  Vaping Use   Vaping Use: Never used  Substance and Sexual Activity   Alcohol use: No   Drug use: No   Sexual activity: Not Currently    Birth control/protection: Surgical  Other Topics Concern   Not on file  Social History Narrative   Not on file   Social Determinants of Health   Financial Resource Strain: Low Risk  (12/28/2020)   Overall Financial Resource Strain (CARDIA)    Difficulty of Paying Living Expenses: Not hard at all  Food Insecurity: No Food Insecurity (08/01/2022)   Hunger Vital Sign    Worried About Running Out of Food in the Last Year: Never true    Ran Out of Food in the Last Year: Never true  Transportation Needs: No Transportation Needs (08/01/2022)   PRAPARE - Administrator, Civil Service (Medical): No    Lack of Transportation (Non-Medical): No  Physical Activity: Inactive (04/23/2021)   Exercise Vital Sign    Days of Exercise per Week: 0 days    Minutes of Exercise per Session: 0 min  Stress: Stress Concern Present (04/23/2021)   [employer] of Occupational Health - Occupational Stress Questionnaire    Feeling of Stress : Rather much  Social Connections: Moderately Integrated (12/28/2020)   Social Connection and Isolation Panel [NHANES]    Frequency of Communication with Friends and Family: More than three times a week    Frequency of Social Gatherings with Friends and Family: Once a week    Attends Religious Services: More than 4 times per year    Active Member of [employer] or  Organizations: No    Attends Banker Meetings: Never    Marital Status: Married  Catering manager Violence: Not At Risk (07/26/2022)   Humiliation, Afraid, Rape, and Kick questionnaire    Fear of Current or Ex-Partner: No    Emotionally Abused: No    Physically Abused: No    Sexually Abused: No    Family History: Family History  Problem Relation Age of Onset   Hypertension Mother    Stroke Mother    Colon cancer Neg Hx    Anesthesia problems Neg Hx    Hypotension Neg Hx    Malignant hyperthermia Neg Hx    Pseudochol deficiency Neg Hx    Gastric cancer Neg Hx    Esophageal cancer Neg Hx     Current Medications:  Current Outpatient Medications:    acyclovir (ZOVIRAX) 800 MG tablet, Take 1 tablet (800 mg total) by mouth 5 (five) times daily as needed., Disp: 50 tablet, Rfl: 1   Allopurinol 200 MG TABS, Take 200 mg by mouth daily., Disp: 30 tablet, Rfl: 3   amLODipine (NORVASC) 10 MG tablet, Take 10 mg by mouth every morning., Disp: , Rfl:    cetirizine (ZYRTEC) 10 MG tablet, Take 1 tablet by mouth once daily, Disp: 30 tablet, Rfl: 0   cholecalciferol (CHOLECALCIFEROL) 25 MCG tablet, Take 1 tablet (1,000 Units total) by mouth daily., Disp: , Rfl:    EPINEPHrine 0.3 mg/0.3 mL IJ SOAJ injection, INJECT 0.3 MLS INTO MUSCLE ONCE AS NEEDED, Disp: 1 each, Rfl: 2   furosemide (LASIX) 20 MG tablet, TAKE 1 TABLET BY MOUTH ONCE DAILY AS NEEDED, Disp: 30 tablet, Rfl: 3   hydrOXYzine (ATARAX) 50 MG tablet, Take 1 tablet (50 mg total) by mouth 3 (three) times daily as needed., Disp: 90 tablet, Rfl: 1   lidocaine-prilocaine (EMLA) cream, Apply 1 Application topically as needed (Access to port and pain)., Disp: 30 g, Rfl: 6  metoprolol tartrate (LOPRESSOR) 25 MG tablet, Take 1.5 tablets (37.5 mg total) by mouth 2 (two) times daily., Disp: 180 tablet, Rfl: 3   mirtazapine (REMERON) 15 MG tablet, Take 1 tablet (15 mg total) by mouth at bedtime., Disp: 30 tablet, Rfl: 5   momelotinib  dihydrochloride (OJJAARA) 150 MG tablet, Take 1 tablet (150 mg total) by mouth daily., Disp: 30 tablet, Rfl: 1   Multiple Vitamin (MULTIVITAMIN WITH MINERALS) TABS tablet, Take 1 tablet by mouth daily., Disp: , Rfl:    ondansetron (ZOFRAN-ODT) 8 MG disintegrating tablet, Take 1 tablet (8 mg total) by mouth every 8 (eight) hours as needed for nausea or vomiting., Disp: 20 tablet, Rfl: 0   oxyCODONE (ROXICODONE) 15 MG immediate release tablet, Take 15 mg by mouth every 6 (six) hours as needed., Disp: , Rfl:    pantoprazole (PROTONIX) 40 MG tablet, TAKE ONE TABLET BY MOUTH 30 TO 60 MINUTES BEFORE YOUR FIRST AND LAST MEALS OF THE DAY, Disp: 60 tablet, Rfl: 0   potassium chloride (KLOR-CON) 10 MEQ tablet, Take 1 tablet by mouth once daily, Disp: 30 tablet, Rfl: 4   pregabalin (LYRICA) 75 MG capsule, Take 1 capsule (75 mg total) by mouth 2 (two) times daily., Disp: 60 capsule, Rfl: 0   prochlorperazine (COMPAZINE) 10 MG tablet, Take 1 tablet (10 mg total) by mouth every 6 (six) hours as needed for nausea or vomiting., Disp: 60 tablet, Rfl: 1   temazepam (RESTORIL) 30 MG capsule, Take 1 capsule (30 mg total) by mouth at bedtime as needed for sleep., Disp: 30 capsule, Rfl: 5   tiZANidine (ZANAFLEX) 4 MG tablet, Take 1 tablet (4 mg total) by mouth every 8 (eight) hours as needed for muscle spasms., Disp: 30 tablet, Rfl: 0 No current facility-administered medications for this visit.  Facility-Administered Medications Ordered in Other Visits:    lanreotide acetate (SOMATULINE DEPOT) 120 MG/0.5ML injection, , , ,    lanreotide acetate (SOMATULINE DEPOT) 120 MG/0.5ML injection, , , ,    lanreotide acetate (SOMATULINE DEPOT) 120 MG/0.5ML injection, , , ,    octreotide (SANDOSTATIN LAR) 30 MG IM injection, , , ,    octreotide (SANDOSTATIN LAR) 30 MG IM injection, , , ,    Allergies: Allergies  Allergen Reactions   Bee Venom Swelling and Hives   Norvasc [Amlodipine] Other (See Comments)    Hair loss    Tylenol [Acetaminophen] Itching   Hydralazine Other (See Comments)    Hair loss   Red Dye Itching   Percocet [Oxycodone-Acetaminophen] Rash and Other (See Comments)    Pt states, "this gives her a rash, but at home she takes oxycodone for pain relief"    Tyloxapol Itching and Rash    REVIEW OF SYSTEMS:   Review of Systems  Constitutional:  Positive for fatigue. Negative for chills and fever.  HENT:   Negative for lump/mass, mouth sores, nosebleeds, sore throat and trouble swallowing.   Eyes:  Negative for eye problems.  Respiratory:  Negative for cough and shortness of breath.   Cardiovascular:  Negative for chest pain, leg swelling and palpitations.  Gastrointestinal:  Negative for abdominal pain, constipation, diarrhea, nausea and vomiting.  Genitourinary:  Negative for bladder incontinence, difficulty urinating, dysuria, frequency, hematuria and nocturia.   Musculoskeletal:  Negative for arthralgias, back pain, flank pain, myalgias and neck pain.  Skin:  Negative for itching and rash.  Neurological:  Negative for dizziness, headaches and numbness.  Hematological:  Does not bruise/bleed easily.  Psychiatric/Behavioral:  Negative  for depression, sleep disturbance and suicidal ideas. The patient is not nervous/anxious.   All other systems reviewed and are negative.    VITALS:   Blood pressure (!) 127/52, pulse (!) 58, temperature 98.6 F (37 C), resp. rate 18, weight 115 lb 11.2 oz (52.5 kg), SpO2 100 %.  Wt Readings from Last 3 Encounters:  08/09/22 115 lb 11.2 oz (52.5 kg)  07/26/22 116 lb (52.6 kg)  07/21/22 116 lb (52.6 kg)    Body mass index is 19.25 kg/m.  Performance status (ECOG): 1 - Symptomatic but completely ambulatory  PHYSICAL EXAM:   Physical Exam Vitals and nursing note reviewed. Exam conducted with a chaperone present.  Constitutional:      Appearance: Normal appearance.  Cardiovascular:     Rate and Rhythm: Normal rate and regular rhythm.     Pulses:  Normal pulses.     Heart sounds: Normal heart sounds.  Pulmonary:     Effort: Pulmonary effort is normal.     Breath sounds: Normal breath sounds.  Abdominal:     Palpations: Abdomen is soft. There is no hepatomegaly, splenomegaly or mass.     Tenderness: There is no abdominal tenderness.  Musculoskeletal:     Right lower leg: No edema.     Left lower leg: No edema.  Lymphadenopathy:     Cervical: No cervical adenopathy.     Right cervical: No superficial, deep or posterior cervical adenopathy.    Left cervical: No superficial, deep or posterior cervical adenopathy.     Upper Body:     Right upper body: No supraclavicular or axillary adenopathy.     Left upper body: No supraclavicular or axillary adenopathy.  Neurological:     General: No focal deficit present.     Mental Status: She is alert and oriented to person, place, and time.  Psychiatric:        Mood and Affect: Mood normal.        Behavior: Behavior normal.     LABS:      Latest Ref Rng & Units 08/08/2022    1:28 PM 07/27/2022    5:28 AM 07/26/2022    4:47 PM  CBC  WBC 4.0 - 10.5 K/uL 44.9  54.3  50.2   Hemoglobin 12.0 - 15.0 g/dL 7.2  7.7  8.1   Hematocrit 36.0 - 46.0 % 23.0  24.6  25.6   Platelets 150 - 400 K/uL 119  124  109       Latest Ref Rng & Units 08/08/2022    1:28 PM 07/29/2022    4:32 AM 07/27/2022    5:28 AM  CMP  Glucose 70 - 99 mg/dL 478  295  621   BUN 8 - 23 mg/dL 37  20  36   Creatinine 0.44 - 1.00 mg/dL 3.08  6.57  8.46   Sodium 135 - 145 mmol/L 137  137  140   Potassium 3.5 - 5.1 mmol/L 3.8  4.0  4.5   Chloride 98 - 111 mmol/L 109  110  115   CO2 22 - 32 mmol/L 20  20  19    Calcium 8.9 - 10.3 mg/dL 8.2  8.7  8.1   Total Protein 6.5 - 8.1 g/dL 7.3     Total Bilirubin 0.3 - 1.2 mg/dL 0.8     Alkaline Phos 38 - 126 U/L 92     AST 15 - 41 U/L 25     ALT 0 - 44 U/L 19  No results found for: "CEA1", "CEA" / No results found for: "CEA1", "CEA" No results found for: "PSA1" No results  found for: "ZOX096" No results found for: "CAN125"  No results found for: "TOTALPROTELP", "ALBUMINELP", "A1GS", "A2GS", "BETS", "BETA2SER", "GAMS", "MSPIKE", "SPEI" Lab Results  Component Value Date   TIBC 329 08/25/2015   TIBC 336 02/21/2011   FERRITIN 26 08/25/2015   FERRITIN 81 02/21/2011   IRONPCTSAT 8 (L) 08/25/2015   IRONPCTSAT 23 02/21/2011   Lab Results  Component Value Date   LDH 588 (H) 07/19/2022   LDH 620 (H) 06/07/2022   LDH 832 (H) 04/21/2022     STUDIES:   MR Brain Wo Contrast (neuro protocol)  Result Date: 07/26/2022 CLINICAL DATA:  Mental status change with unknown cause EXAM: MRI HEAD WITHOUT CONTRAST TECHNIQUE: Multiplanar, multiecho pulse sequences of the brain and surrounding structures were obtained without intravenous contrast. COMPARISON:  Head CT from 5 days ago FINDINGS: Brain: No acute infarction, hemorrhage, hydrocephalus, extra-axial collection or mass lesion. Generalized atrophy without specific pattern. Mild for age chronic small vessel ischemia in the cerebral white matter. Vascular: Normal flow voids Skull and upper cervical spine: Diffusely T1 hypointense marrow with prominent diffusion signal. There is sclerosis by CT with vague lucency in the right frontal bone that is not detected on this scan. History of myeloproliferative disease per the chart. Sinuses/Orbits: Bilateral cataract resection. IMPRESSION: No acute, reversible, or specific cause for symptoms. Generalized atrophy. Electronically Signed   By: Tiburcio Pea M.D.   On: 07/26/2022 18:18   DG Chest Port 1 View  Result Date: 07/26/2022 CLINICAL DATA:  Weakness EXAM: PORTABLE CHEST 1 VIEW COMPARISON:  X-ray 07/21/2022 FINDINGS: Normal cardiopericardial silhouette. Calcified aorta. No pneumothorax, effusion or consolidation. Mild basilar atelectasis. No edema. Underlying chronic lung changes. Right IJ chest port with tip along the central SVC above the right atrium. Overlapping cardiac leads.  IMPRESSION: Chronic changes.  Chest port. Slight basilar atelectasis. Electronically Signed   By: Karen Kays M.D.   On: 07/26/2022 16:13   MR LUMBAR SPINE WO CONTRAST  Result Date: 07/22/2022 CLINICAL DATA:  Low back pain with cancer suspected. Right leg weakness EXAM: MRI LUMBAR SPINE WITHOUT CONTRAST TECHNIQUE: Multiplanar, multisequence MR imaging of the lumbar spine was performed. No intravenous contrast was administered. COMPARISON:  06/23/2003 FINDINGS: Segmentation:  Standard. Alignment:  L4-5 anterolisthesis, facet mediated. Vertebrae: Diffusely low marrow signal on T1 weighted imaging. No evidence of fracture or focal bone lesion. Conus medullaris and cauda equina: Conus extends to the L2-3 level. Conus and cauda equina appear normal. Paraspinal and other soft tissues: Negative for Perispinal mass or inflammation. Large spleen projecting to the level of the interpolar left kidney. Disc levels: L1-L2: Mild degenerative facet spurring L2-L3: Moderate degenerative facet spurring L3-L4: Mild disc height loss and bulging. Degenerative facet spurring greater on the right. L4-L5: Bulky degenerative facet spurring with anterolisthesis. The disc is narrowed and circumferentially bulging with high-grade left and more mild right foraminal stenosis. Patent spinal canal L5-S1:Disc narrowing with endplate and facet spurring. No neural compression. IMPRESSION: 1. Generalized alteration of marrow signal with splenic enlargement likely related to patient's chart history of myeloproliferative disorder. 2. Lumbar spine degeneration especially affecting facets with L4-5 anterolisthesis. 3. L4-5 left foraminal impingement. No nerve root compression on the symptomatic right side. Electronically Signed   By: Tiburcio Pea M.D.   On: 07/22/2022 20:20   CT Head Wo Contrast  Result Date: 07/21/2022 CLINICAL DATA:  Neck trauma (Age >= 65y); Head  trauma, minor (Age >= 65y) EXAM: CT HEAD WITHOUT CONTRAST CT CERVICAL SPINE  WITHOUT CONTRAST TECHNIQUE: Multidetector CT imaging of the head and cervical spine was performed following the standard protocol without intravenous contrast. Multiplanar CT image reconstructions of the cervical spine were also generated. RADIATION DOSE REDUCTION: This exam was performed according to the departmental dose-optimization program which includes automated exposure control, adjustment of the mA and/or kV according to patient size and/or use of iterative reconstruction technique. COMPARISON:  None Available. FINDINGS: CT HEAD FINDINGS Brain: No evidence of acute infarction, hemorrhage, hydrocephalus, extra-axial collection or mass lesion/mass effect. Mild chronic microvascular ischemic change. Vascular: No hyperdense vessel or unexpected calcification. Skull: Nonspecific lucent lesion in the right frontal calvarium, unchanged compared to 09/01/2021. The inner and outer tables of the skull are intact. No acute fracture. Sinuses/Orbits: No middle ear or mastoid effusion. Paranasal sinuses are notable for mucosal thickening in the bilateral ethmoid sinuses. Bilateral lens replacement. Orbits are otherwise unremarkable. Other: None. CT CERVICAL SPINE FINDINGS Alignment: Trace retrolisthesis of C2 on C3. Multilevel degenerative endplate changes, most notably in the upper cervical spine. Skull base and vertebrae: No acute fracture. No primary bone lesion or focal pathologic process. Soft tissues and spinal canal: No prevertebral fluid or swelling. No visible canal hematoma. Disc levels: No evidence of high-grade spinal canal stenosis Upper chest: Negative. Other: None IMPRESSION: 1. No acute intracranial abnormality. 2. No acute fracture or traumatic malalignment of the cervical spine. 3. Nonspecific lucent lesion in the right frontal calvarium, unchanged compared to 09/01/2021 Electronically Signed   By: Lorenza Cambridge M.D.   On: 07/21/2022 15:14   CT Cervical Spine Wo Contrast  Result Date:  07/21/2022 CLINICAL DATA:  Neck trauma (Age >= 65y); Head trauma, minor (Age >= 65y) EXAM: CT HEAD WITHOUT CONTRAST CT CERVICAL SPINE WITHOUT CONTRAST TECHNIQUE: Multidetector CT imaging of the head and cervical spine was performed following the standard protocol without intravenous contrast. Multiplanar CT image reconstructions of the cervical spine were also generated. RADIATION DOSE REDUCTION: This exam was performed according to the departmental dose-optimization program which includes automated exposure control, adjustment of the mA and/or kV according to patient size and/or use of iterative reconstruction technique. COMPARISON:  None Available. FINDINGS: CT HEAD FINDINGS Brain: No evidence of acute infarction, hemorrhage, hydrocephalus, extra-axial collection or mass lesion/mass effect. Mild chronic microvascular ischemic change. Vascular: No hyperdense vessel or unexpected calcification. Skull: Nonspecific lucent lesion in the right frontal calvarium, unchanged compared to 09/01/2021. The inner and outer tables of the skull are intact. No acute fracture. Sinuses/Orbits: No middle ear or mastoid effusion. Paranasal sinuses are notable for mucosal thickening in the bilateral ethmoid sinuses. Bilateral lens replacement. Orbits are otherwise unremarkable. Other: None. CT CERVICAL SPINE FINDINGS Alignment: Trace retrolisthesis of C2 on C3. Multilevel degenerative endplate changes, most notably in the upper cervical spine. Skull base and vertebrae: No acute fracture. No primary bone lesion or focal pathologic process. Soft tissues and spinal canal: No prevertebral fluid or swelling. No visible canal hematoma. Disc levels: No evidence of high-grade spinal canal stenosis Upper chest: Negative. Other: None IMPRESSION: 1. No acute intracranial abnormality. 2. No acute fracture or traumatic malalignment of the cervical spine. 3. Nonspecific lucent lesion in the right frontal calvarium, unchanged compared to 09/01/2021  Electronically Signed   By: Lorenza Cambridge M.D.   On: 07/21/2022 15:14   DG Pelvis Portable  Result Date: 07/21/2022 CLINICAL DATA:  Fall last night.  Bilateral leg pain/weakness. EXAM: PORTABLE PELVIS 1-2 VIEWS COMPARISON:  Radiographs 12/09/2021.  Pelvic CT 08/25/2021. FINDINGS: Status post right total hip arthroplasty the hardware appears intact, without evidence of loosening. There is no evidence of acute fracture or dislocation. No significant arthropathy of the left hip or sacroiliac joints. The soft tissues appear unremarkable. IMPRESSION: No evidence of acute fracture or dislocation. Intact right total hip arthroplasty. Electronically Signed   By: Carey Bullocks M.D.   On: 07/21/2022 14:40   DG Knee Complete 4 Views Right  Result Date: 07/21/2022 CLINICAL DATA:  Bilateral leg pain and weakness since falling last night. EXAM: RIGHT KNEE - COMPLETE 4+ VIEW COMPARISON:  Radiographs 01/05/2016. FINDINGS: The bones appear mildly demineralized. No evidence of acute fracture, dislocation or significant joint effusion. The joint spaces are preserved. There is a stable broad-based osseous excrescence from the lateral aspect of the distal femoral metadiaphysis, consistent with an osteochondroma. IMPRESSION: No evidence of acute right knee injury. Stable distal femoral osteochondroma. Electronically Signed   By: Carey Bullocks M.D.   On: 07/21/2022 14:39   DG Chest Port 1 View  Result Date: 07/21/2022 CLINICAL DATA:  Fall last night. EXAM: PORTABLE CHEST 1 VIEW COMPARISON:  Radiographs 05/25/2022 and 02/08/2022.  CT 06/30/2022. FINDINGS: 1401 hours. Right IJ Port-A-Cath extends to the mid SVC level, stable. The heart size and mediastinal contours are stable. The lungs are clear. There is no pleural effusion or pneumothorax. No acute osseous findings are evident. Telemetry leads overlie the chest. IMPRESSION: No evidence of acute chest injury. Stable right IJ Port-A-Cath position. Electronically Signed    By: Carey Bullocks M.D.   On: 07/21/2022 14:37

## 2022-08-09 ENCOUNTER — Inpatient Hospital Stay (HOSPITAL_BASED_OUTPATIENT_CLINIC_OR_DEPARTMENT_OTHER): Payer: Medicare Other | Admitting: Hematology

## 2022-08-09 ENCOUNTER — Telehealth: Payer: Self-pay | Admitting: Family Medicine

## 2022-08-09 ENCOUNTER — Encounter: Payer: Self-pay | Admitting: Hematology

## 2022-08-09 VITALS — BP 127/52 | HR 58 | Temp 98.6°F | Resp 18 | Wt 115.7 lb

## 2022-08-09 DIAGNOSIS — D471 Chronic myeloproliferative disease: Secondary | ICD-10-CM | POA: Diagnosis not present

## 2022-08-09 DIAGNOSIS — I129 Hypertensive chronic kidney disease with stage 1 through stage 4 chronic kidney disease, or unspecified chronic kidney disease: Secondary | ICD-10-CM | POA: Diagnosis not present

## 2022-08-09 DIAGNOSIS — M3214 Glomerular disease in systemic lupus erythematosus: Secondary | ICD-10-CM | POA: Diagnosis not present

## 2022-08-09 DIAGNOSIS — N183 Chronic kidney disease, stage 3 unspecified: Secondary | ICD-10-CM | POA: Diagnosis not present

## 2022-08-09 DIAGNOSIS — Z87891 Personal history of nicotine dependence: Secondary | ICD-10-CM | POA: Diagnosis not present

## 2022-08-09 LAB — TYPE AND SCREEN: Unit division: 0

## 2022-08-09 LAB — BPAM RBC
Blood Product Expiration Date: 202407152359
Unit Type and Rh: 5100

## 2022-08-09 LAB — PREPARE RBC (CROSSMATCH)

## 2022-08-09 NOTE — Progress Notes (Signed)
Patient is taking Ojjaara as prescribed.  She has not missed any doses and reports no side effects at this time.   

## 2022-08-09 NOTE — Telephone Encounter (Signed)
Renee Waters w. [facility] [phone])  Verbal order for start of care Friday 7/5.  Patient would like to push start date to Friday 7/5

## 2022-08-09 NOTE — Telephone Encounter (Signed)
Verbal order given  

## 2022-08-09 NOTE — Addendum Note (Signed)
Addended by: Dicky Doe D on: 08/09/2022 08:48 AM   Modules accepted: Orders

## 2022-08-09 NOTE — Patient Instructions (Addendum)
[facility] at [facility] Discharge Instructions   You were seen and examined today by Dr. Ellin Saba.  He reviewed the results of your lab work. Your hemoglobin is 7.2. We will plan to give you one unit of blood to help correct this. Your kidney number is elevated at 1.98. We will give you so IV fluids tomorrow along with your blood.   Dr. Kirtland Bouchard wants you to go back to taking the Ojjaarra 100 mg tablets.   We will see you back in 3 weeks. We will repeat lab work at that time.   Return as scheduled.      Thank you for choosing [facility] at [facility] to provide your oncology and hematology care.  To afford each patient quality time with our provider, please arrive at least 15 minutes before your scheduled appointment time.   If you have a lab appointment with the Cancer Center please come in thru the Main Entrance and check in at the main information desk.  You need to re-schedule your appointment should you arrive 10 or more minutes late.  We strive to give you quality time with our providers, and arriving late affects you and other patients whose appointments are after yours.  Also, if you no show three or more times for appointments you may be dismissed from the clinic at the providers discretion.     Again, thank you for choosing [facility].  Our hope is that these requests will decrease the amount of time that you wait before being seen by our physicians.       _____________________________________________________________  Should you have questions after your visit to [facility], please contact our office at [phone] and follow the prompts.  Our office hours are 8:00 a.m. and 4:30 p.m. Monday - Friday.  Please note that voicemails left after 4:00 p.m. may not be returned until the following business day.  We are closed weekends and major holidays.  You do have access to a nurse 24-7, just call the main number  to the clinic [phone] and do not press any options, hold on the line and a nurse will answer the phone.    For prescription refill requests, have your pharmacy contact our office and allow 72 hours.    Due to Covid, you will need to wear a mask upon entering the hospital. If you do not have a mask, a mask will be given to you at the Main Entrance upon arrival. For doctor visits, patients may have 1 support person age 29 or older with them. For treatment visits, patients can not have anyone with them due to social distancing guidelines and our immunocompromised population.

## 2022-08-10 ENCOUNTER — Inpatient Hospital Stay: Payer: Medicare Other

## 2022-08-10 ENCOUNTER — Telehealth: Payer: Self-pay | Admitting: Internal Medicine

## 2022-08-10 ENCOUNTER — Inpatient Hospital Stay: Payer: Medicare Other | Admitting: Family Medicine

## 2022-08-10 DIAGNOSIS — D471 Chronic myeloproliferative disease: Secondary | ICD-10-CM | POA: Diagnosis not present

## 2022-08-10 DIAGNOSIS — Z87891 Personal history of nicotine dependence: Secondary | ICD-10-CM | POA: Diagnosis not present

## 2022-08-10 DIAGNOSIS — N183 Chronic kidney disease, stage 3 unspecified: Secondary | ICD-10-CM | POA: Diagnosis not present

## 2022-08-10 DIAGNOSIS — M3214 Glomerular disease in systemic lupus erythematosus: Secondary | ICD-10-CM | POA: Diagnosis not present

## 2022-08-10 DIAGNOSIS — I129 Hypertensive chronic kidney disease with stage 1 through stage 4 chronic kidney disease, or unspecified chronic kidney disease: Secondary | ICD-10-CM | POA: Diagnosis not present

## 2022-08-10 LAB — TYPE AND SCREEN
ABO/RH(D): O POS
Antibody Screen: POSITIVE
Donor AG Type: NEGATIVE

## 2022-08-10 MED ORDER — HEPARIN SOD (PORK) LOCK FLUSH 100 UNIT/ML IV SOLN
500.0000 [IU] | Freq: Every day | INTRAVENOUS | Status: AC | PRN
  Administered 2022-08-10: 500 [IU]

## 2022-08-10 MED ORDER — SODIUM CHLORIDE 0.9 % IV SOLN: INTRAVENOUS | Status: DC

## 2022-08-10 MED ORDER — DIPHENHYDRAMINE HCL 25 MG PO CAPS: 25.0000 mg | ORAL_CAPSULE | Freq: Four times a day (QID) | ORAL | Status: DC | PRN

## 2022-08-10 MED ORDER — SODIUM CHLORIDE 0.9% FLUSH
10.0000 mL | INTRAVENOUS | Status: AC | PRN
  Administered 2022-08-10: 10 mL

## 2022-08-10 MED ORDER — ACETAMINOPHEN 325 MG PO TABS
650.0000 mg | ORAL_TABLET | ORAL | Status: AC
  Administered 2022-08-10: 650 mg via ORAL
  Filled 2022-08-10: qty 2

## 2022-08-10 MED ORDER — SODIUM CHLORIDE 0.9% IV SOLUTION
250.0000 mL | Freq: Once | INTRAVENOUS | Status: AC
  Administered 2022-08-10: 250 mL via INTRAVENOUS

## 2022-08-10 NOTE — Progress Notes (Signed)
Notified by RN that patient blood pressure 63/40 on first check today.  Patient has history of labile blood pressure, with episodes of both severe hypotension and significant hypertension.  She was recently admitted to the hospital in June 2024, with significant hypertension (SBP 160-180) during that time.  She reports taking home BP meds as prescribed (metoprolol 37.5 mg twice daily + amlodipine 10 mg daily).  She takes Lasix 20 mg as needed, but did not take a dose today.  She did not take any of her pain medications, anxiolytics, or muscle relaxants today.  She reports that she drinks about 80 ounces of water daily.  She is asymptomatic - reports feeling "sleepy," which is common for her.  She denies any altered mental status, lightheadedness, neurologic changes, or syncopal episodes.  Patient blood pressure medications have been previously managed by her cardiologist (Dr. Carolan Clines in [place]), but patient requests transferring to local cardiologist in [place].  Per Dr. Ellin Saba, hypotension may be side effect of her momelotinib.  She recently had dose reduced.  She is here today for blood transfusion.  Will transfuse 1 unit PRBC and 500 mL NS. Manual BP recheck showed BP 72/42. Will recheck BP after blood and fluids, and consider additional IV fluids if she remains <90/60. We will schedule her for repeat blood pressure check and symptom management visit on Friday, 08/12/2022.  The above plan was discussed with Dr. Ellin Saba, who agrees.  Carnella Guadalajara, PA-C  08/10/22 12:57 PM

## 2022-08-10 NOTE — Progress Notes (Deleted)
NO SHOW for Symptom Management Visit

## 2022-08-10 NOTE — Telephone Encounter (Signed)
  Pt's husband calling, he is requesting to switch pt's heart doctor from Dr. Carolan Clines to Dr. Jenene Slicker. Pt is closer to [place] office

## 2022-08-10 NOTE — Patient Instructions (Signed)
[facility]  Discharge Instructions: Thank you for choosing [facility] to provide your oncology and hematology care.  If you have a lab appointment with the Cancer Center - please note that after April 8th, 2024, all labs will be drawn in the cancer center.  You do not have to check in or register with the main entrance as you have in the past but will complete your check-in in the cancer center.  Wear comfortable clothing and clothing appropriate for easy access to any Portacath or PICC line.   We strive to give you quality time with your provider. You may need to reschedule your appointment if you arrive late (15 or more minutes).  Arriving late affects you and other patients whose appointments are after yours.  Also, if you miss three or more appointments without notifying the office, you may be dismissed from the clinic at the provider's discretion.      For prescription refill requests, have your pharmacy contact our office and allow 72 hours for refills to be completed.    Today you received one unit of blood and one liter of fluids for low blood pressure   To help prevent nausea and vomiting after your treatment, we encourage you to take your nausea medication as directed.  BELOW ARE SYMPTOMS THAT SHOULD BE REPORTED IMMEDIATELY: *FEVER GREATER THAN 100.4 F (38 C) OR HIGHER *CHILLS OR SWEATING *NAUSEA AND VOMITING THAT IS NOT CONTROLLED WITH YOUR NAUSEA MEDICATION *UNUSUAL SHORTNESS OF BREATH *UNUSUAL BRUISING OR BLEEDING *URINARY PROBLEMS (pain or burning when urinating, or frequent urination) *BOWEL PROBLEMS (unusual diarrhea, constipation, pain near the anus) TENDERNESS IN MOUTH AND THROAT WITH OR WITHOUT PRESENCE OF ULCERS (sore throat, sores in mouth, or a toothache) UNUSUAL RASH, SWELLING OR PAIN  UNUSUAL VAGINAL DISCHARGE OR ITCHING   Items with * indicate a potential emergency and should be followed up as soon as possible or go to the  Emergency Department if any problems should occur.  Please show the CHEMOTHERAPY ALERT CARD or IMMUNOTHERAPY ALERT CARD at check-in to the Emergency Department and triage nurse.  Should you have questions after your visit or need to cancel or reschedule your appointment, please contact [facility] [phone]  and follow the prompts.  Office hours are 8:00 a.m. to 4:30 p.m. Monday - Friday. Please note that voicemails left after 4:00 p.m. may not be returned until the following business day.  We are closed weekends and major holidays. You have access to a nurse at all times for urgent questions. Please call the main number to the clinic [phone] and follow the prompts.  For any non-urgent questions, you may also contact your provider using MyChart. We now offer e-Visits for anyone 17 and older to request care online for non-urgent symptoms. For details visit [email or web address].   Also download the MyChart app! Go to the app store, search "MyChart", open the app, select [place], and log in with your MyChart username and password.

## 2022-08-10 NOTE — Progress Notes (Signed)
Patient presented to day for one unit of blood transfusion. Patient blood pressure is 63/40, patient walked in with husband at her side., she denies any other issues. Patient states she has not taken any pain medications today.   Rojelio Brenner PA-C notified and will come to room for evaluation.   1255- Bolus was done, [facility] Blood pressure checked, 80/40. Notified Reb. Pennington PA-C. And will give another 500 ml bolus of saline for orders.   Blood pressure 100/59, Reb. Pennington PA-C notified and  Ok for discharge   One unit of blood and one liter of fluids given per orders. Patient tolerated it well without problems. Vitals stable and discharged home from clinic via wheelchair. Follow up as scheduled.

## 2022-08-11 LAB — TYPE AND SCREEN: DAT, IgG: POSITIVE

## 2022-08-12 ENCOUNTER — Inpatient Hospital Stay: Payer: Medicare Other

## 2022-08-12 ENCOUNTER — Other Ambulatory Visit: Payer: Self-pay

## 2022-08-12 ENCOUNTER — Inpatient Hospital Stay: Payer: Medicare Other | Admitting: Physician Assistant

## 2022-08-12 DIAGNOSIS — M519 Unspecified thoracic, thoracolumbar and lumbosacral intervertebral disc disorder: Secondary | ICD-10-CM | POA: Diagnosis not present

## 2022-08-12 DIAGNOSIS — M1990 Unspecified osteoarthritis, unspecified site: Secondary | ICD-10-CM | POA: Diagnosis not present

## 2022-08-12 DIAGNOSIS — I129 Hypertensive chronic kidney disease with stage 1 through stage 4 chronic kidney disease, or unspecified chronic kidney disease: Secondary | ICD-10-CM | POA: Diagnosis not present

## 2022-08-12 DIAGNOSIS — G8929 Other chronic pain: Secondary | ICD-10-CM | POA: Diagnosis not present

## 2022-08-12 DIAGNOSIS — Z9181 History of falling: Secondary | ICD-10-CM | POA: Diagnosis not present

## 2022-08-12 DIAGNOSIS — N184 Chronic kidney disease, stage 4 (severe): Secondary | ICD-10-CM | POA: Diagnosis not present

## 2022-08-12 DIAGNOSIS — K2270 Barrett's esophagus without dysplasia: Secondary | ICD-10-CM | POA: Diagnosis not present

## 2022-08-12 DIAGNOSIS — E876 Hypokalemia: Secondary | ICD-10-CM | POA: Diagnosis not present

## 2022-08-12 DIAGNOSIS — Z87440 Personal history of urinary (tract) infections: Secondary | ICD-10-CM | POA: Diagnosis not present

## 2022-08-12 DIAGNOSIS — D631 Anemia in chronic kidney disease: Secondary | ICD-10-CM | POA: Diagnosis not present

## 2022-08-12 DIAGNOSIS — D630 Anemia in neoplastic disease: Secondary | ICD-10-CM | POA: Diagnosis not present

## 2022-08-12 DIAGNOSIS — M329 Systemic lupus erythematosus, unspecified: Secondary | ICD-10-CM | POA: Diagnosis not present

## 2022-08-12 DIAGNOSIS — C946 Myelodysplastic disease, not classified: Secondary | ICD-10-CM | POA: Diagnosis not present

## 2022-08-12 DIAGNOSIS — G4700 Insomnia, unspecified: Secondary | ICD-10-CM | POA: Diagnosis not present

## 2022-08-12 DIAGNOSIS — F32A Depression, unspecified: Secondary | ICD-10-CM | POA: Diagnosis not present

## 2022-08-12 DIAGNOSIS — N179 Acute kidney failure, unspecified: Secondary | ICD-10-CM | POA: Diagnosis not present

## 2022-08-12 DIAGNOSIS — F419 Anxiety disorder, unspecified: Secondary | ICD-10-CM | POA: Diagnosis not present

## 2022-08-12 DIAGNOSIS — Z87891 Personal history of nicotine dependence: Secondary | ICD-10-CM | POA: Diagnosis not present

## 2022-08-12 DIAGNOSIS — Z556 Problems related to health literacy: Secondary | ICD-10-CM | POA: Diagnosis not present

## 2022-08-12 DIAGNOSIS — M542 Cervicalgia: Secondary | ICD-10-CM | POA: Diagnosis not present

## 2022-08-16 ENCOUNTER — Encounter: Payer: Self-pay | Admitting: Urology

## 2022-08-16 ENCOUNTER — Ambulatory Visit (INDEPENDENT_AMBULATORY_CARE_PROVIDER_SITE_OTHER): Payer: Medicare Other | Admitting: Urology

## 2022-08-16 ENCOUNTER — Inpatient Hospital Stay: Payer: Medicare Other | Admitting: Hematology

## 2022-08-16 ENCOUNTER — Inpatient Hospital Stay: Payer: Medicare Other

## 2022-08-16 VITALS — BP 174/69 | HR 72 | Temp 99.8°F

## 2022-08-16 DIAGNOSIS — R3915 Urgency of urination: Secondary | ICD-10-CM | POA: Diagnosis not present

## 2022-08-16 DIAGNOSIS — R300 Dysuria: Secondary | ICD-10-CM

## 2022-08-16 DIAGNOSIS — R350 Frequency of micturition: Secondary | ICD-10-CM

## 2022-08-16 DIAGNOSIS — R8290 Unspecified abnormal findings in urine: Secondary | ICD-10-CM

## 2022-08-16 DIAGNOSIS — N952 Postmenopausal atrophic vaginitis: Secondary | ICD-10-CM | POA: Diagnosis not present

## 2022-08-16 DIAGNOSIS — R5383 Other fatigue: Secondary | ICD-10-CM

## 2022-08-16 DIAGNOSIS — D84821 Immunodeficiency due to drugs: Secondary | ICD-10-CM | POA: Diagnosis not present

## 2022-08-16 DIAGNOSIS — Z79899 Other long term (current) drug therapy: Secondary | ICD-10-CM

## 2022-08-16 DIAGNOSIS — N390 Urinary tract infection, site not specified: Secondary | ICD-10-CM | POA: Diagnosis not present

## 2022-08-16 LAB — URINALYSIS, ROUTINE W REFLEX MICROSCOPIC
Bilirubin, UA: NEGATIVE
Glucose, UA: NEGATIVE
Ketones, UA: NEGATIVE
Nitrite, UA: POSITIVE — AB
Specific Gravity, UA: 1.015 (ref 1.005–1.030)
Urobilinogen, Ur: 1 mg/dL (ref 0.2–1.0)
pH, UA: 8.5 — ABNORMAL HIGH (ref 5.0–7.5)

## 2022-08-16 LAB — MICROSCOPIC EXAMINATION
RBC, Urine: >30 /hpf — AB (ref 0–2)
WBC, UA: >30 /hpf — AB (ref 0–5)

## 2022-08-16 MED ORDER — AMOXICILLIN-POT CLAVULANATE 500-125 MG PO TABS
1.00 | ORAL_TABLET | Freq: Two times a day (BID) | ORAL | 0 refills | Status: AC
Start: 2022-08-16 — End: 2022-08-23

## 2022-08-16 MED ORDER — TRIMETHOPRIM 100 MG PO TABS
100.0000 mg | ORAL_TABLET | Freq: Every day | ORAL | 11 refills | Status: DC
Start: 2022-08-16 — End: 2022-08-26

## 2022-08-16 NOTE — Progress Notes (Signed)
Name: Renee Waters DOB: November 05, 1945 MRN: [id number]  History of Present Illness: [name] is a 77 y.o. female who presents today for follow up visit at [facility] [place]. - GU history: 1. Recurrent UTI.  - Risk factors include immunosuppression related to medication (Ojjaara) for Lupus and vaginal atrophy. - Taking D-mannose and probiotics for UTI prevention. - Previously took daily low dose Trimethoprim in late 2023 for UTI prophylaxis as per Dr. Pete Waters. 2. Vaginal atrophy. Per prior notes: "She was not able to use estrogen cream as it broke her out." 3. Herpes. 4. Surgical history includes prior hysterectomy.  Urine culture results in past 12 months: - 08/25/2021: Positive for E. coli - 09/03/2021: Negative - 10/08/2021: Positive for E. coli - 10/19/2021: Positive for E. coli - 12/14/2021: Negative - 02/09/2022: Negative - 03/14/2022: Positive for E. coli - 05/17/2022: Positive for Enterococcus faecalis - 07/21/2022: Negative  At last visit with Dr. Retta Waters on 05/17/2022: The plan was:  - strongly recommended that she double void at least 2-3 times a day - Continue mannose, drinking lots of fluids  Today: She reports general malaise, fatigue, increased urinary urgency, frequency, dysuria. Denies gross hematuria, straining to void, or sensations of incomplete emptying.  She denies flank pain. She reports some generalized abdominal pain. She denies fevers. She denies nausea/ vomiting.   Fall Screening: Do you usually have a device to assist in your mobility? No   Medications: Current Outpatient Medications  Medication Sig Dispense Refill   amoxicillin-clavulanate (AUGMENTIN) 500-125 MG tablet Take 1 tablet by mouth in the morning and at bedtime for 7 days. 14 tablet 0   trimethoprim (TRIMPEX) 100 MG tablet Take 1 tablet (100 mg total) by mouth daily. 30 tablet 11   acyclovir (ZOVIRAX) 800 MG tablet Take 1 tablet (800 mg total) by mouth 5 (five) times  daily as needed. 50 tablet 1   Allopurinol 200 MG TABS Take 200 mg by mouth daily. 30 tablet 3   amLODipine (NORVASC) 10 MG tablet Take 10 mg by mouth every morning.     cetirizine (ZYRTEC) 10 MG tablet Take 1 tablet by mouth once daily 30 tablet 0   cholecalciferol (CHOLECALCIFEROL) 25 MCG tablet Take 1 tablet (1,000 Units total) by mouth daily.     EPINEPHrine 0.3 mg/0.3 mL IJ SOAJ injection INJECT 0.3 MLS INTO MUSCLE ONCE AS NEEDED 1 each 2   furosemide (LASIX) 20 MG tablet TAKE 1 TABLET BY MOUTH ONCE DAILY AS NEEDED 30 tablet 3   hydrOXYzine (ATARAX) 50 MG tablet Take 1 tablet (50 mg total) by mouth 3 (three) times daily as needed. 90 tablet 1   lidocaine-prilocaine (EMLA) cream Apply 1 Application topically as needed (Access to port and pain). 30 g 6   metoprolol tartrate (LOPRESSOR) 25 MG tablet Take 1.5 tablets (37.5 mg total) by mouth 2 (two) times daily. 180 tablet 3   mirtazapine (REMERON) 15 MG tablet Take 1 tablet (15 mg total) by mouth at bedtime. 30 tablet 5   momelotinib dihydrochloride (OJJAARA) 150 MG tablet Take 1 tablet (150 mg total) by mouth daily. 30 tablet 1   Multiple Vitamin (MULTIVITAMIN WITH MINERALS) TABS tablet Take 1 tablet by mouth daily.     ondansetron (ZOFRAN-ODT) 8 MG disintegrating tablet Take 1 tablet (8 mg total) by mouth every 8 (eight) hours as needed for nausea or vomiting. 20 tablet 0   oxyCODONE (ROXICODONE) 15 MG immediate release tablet Take 15 mg by mouth every 6 (six) hours as  needed.     pantoprazole (PROTONIX) 40 MG tablet TAKE ONE TABLET BY MOUTH 30 TO 60 MINUTES BEFORE YOUR FIRST AND LAST MEALS OF THE DAY 60 tablet 0   potassium chloride (KLOR-CON) 10 MEQ tablet Take 1 tablet by mouth once daily 30 tablet 4   pregabalin (LYRICA) 75 MG capsule Take 1 capsule (75 mg total) by mouth 2 (two) times daily. 60 capsule 0   prochlorperazine (COMPAZINE) 10 MG tablet Take 1 tablet (10 mg total) by mouth every 6 (six) hours as needed for nausea or vomiting. 60  tablet 1   temazepam (RESTORIL) 30 MG capsule Take 1 capsule (30 mg total) by mouth at bedtime as needed for sleep. 30 capsule 5   tiZANidine (ZANAFLEX) 4 MG tablet Take 1 tablet (4 mg total) by mouth every 8 (eight) hours as needed for muscle spasms. 30 tablet 0   No current facility-administered medications for this visit.   Facility-Administered Medications Ordered in Other Visits  Medication Dose Route Frequency Provider Last Rate Last Admin   lanreotide acetate (SOMATULINE DEPOT) 120 MG/0.5ML injection            lanreotide acetate (SOMATULINE DEPOT) 120 MG/0.5ML injection            lanreotide acetate (SOMATULINE DEPOT) 120 MG/0.5ML injection            octreotide (SANDOSTATIN LAR) 30 MG IM injection            octreotide (SANDOSTATIN LAR) 30 MG IM injection             Allergies: Allergies  Allergen Reactions   Bee Venom Swelling and Hives   Norvasc [Amlodipine] Other (See Comments)    Hair loss   Tylenol [Acetaminophen] Itching   Hydralazine Other (See Comments)    Hair loss   Red Dye Itching   Percocet [Oxycodone-Acetaminophen] Rash and Other (See Comments)    Pt states, "this gives her a rash, but at home she takes oxycodone for pain relief"    Tyloxapol Itching and Rash    Past Medical History:  Diagnosis Date   Acute cholangitis    Acute respiratory failure with hypoxia (HCC) 09/01/2021   Allergy    Anemia    Anxiety    Arthritis    Phreesia 03/18/2020   Barrett's esophagus    Cataract    Chronic back pain    Chronic neck pain    CKD (chronic kidney disease) stage 3, GFR 30-59 ml/min (HCC) 10/29/2020   Depression    Genital herpes    GERD (gastroesophageal reflux disease)    H/O degenerative disc disease    History of blood transfusion    Hypertension    Hypoxia 12/01/2021   Insomnia    Lupus (systemic lupus erythematosus) (HCC)    Neuromuscular disorder (HCC)    Osteoarthritis    S/P colonoscopy 07/2003   normal, no polyps   S/P endoscopy June  2005, Oct 2009   2005: short-segment Barrett's, 2009: short-segment Barrett's   Upper respiratory tract infection 07/09/2020   UTI (lower urinary tract infection) 11/2012   Past Surgical History:  Procedure Laterality Date   ABDOMINAL HYSTERECTOMY     BACK SURGERY     BIOPSY N/A 03/20/2014   Procedure: BIOPSY;  Surgeon: Corbin Ade, MD;  Location: [facility];  Service: Endoscopy;  Laterality: N/A;   BIOPSY  09/14/2015   Procedure: BIOPSY;  Surgeon: Corbin Ade, MD;  Location: [facility];  Service: Endoscopy;;  esophageal and gastric   BIOPSY  07/23/2019   Procedure: BIOPSY;  Surgeon: Corbin Ade, MD;  Location: [facility];  Service: Endoscopy;;  esophageal    CARPAL TUNNEL RELEASE Left 2013   cervical disectomy  2002   CESAREAN SECTION N/A    Phreesia 03/18/2020   CHOLECYSTECTOMY     with lysis of adhesions for sbo; "ruptured gallbladder".   COLONOSCOPY  11/09/2011   RMR: Melanosis coli   COLONOSCOPY WITH PROPOFOL N/A 09/14/2015   Dr. Jena Gauss: diverticulosis, 3mm TA removed. next TCS 09/2020.    COLONOSCOPY WITH PROPOFOL N/A 04/30/2020   Procedure: COLONOSCOPY WITH PROPOFOL;  Surgeon: Corbin Ade, MD;  Location: [facility];  Service: Endoscopy;  Laterality: N/A;  PM (ASA 3)   DENTAL SURGERY  11/2015   multiple tooth extraction   ESOPHAGOGASTRODUODENOSCOPY  11/29/2007   salmon-colored  tongue   longest stable at  3 cm, distal esophagus as described previously status post biopsy/ Hiatal hernia, otherwise normal stomach D1 and D2   ESOPHAGOGASTRODUODENOSCOPY  01/06/11   short segment Barrett's esophagus s/p bx/Hiatal hernia   ESOPHAGOGASTRODUODENOSCOPY (EGD) WITH PROPOFOL N/A 03/20/2014   RUE:[id number] distal esophagus short segment barrett's, bx with no dysplasia. next egd in 03/2017   ESOPHAGOGASTRODUODENOSCOPY (EGD) WITH PROPOFOL N/A 09/14/2015   Dr. Jena Gauss: Barrett's without dysplasia, gastritis benign bx, hiatal hernia. next EGD 09/2018.   ESOPHAGOGASTRODUODENOSCOPY (EGD) WITH  PROPOFOL N/A 07/23/2019   Procedure: ESOPHAGOGASTRODUODENOSCOPY (EGD) WITH PROPOFOL;  Surgeon: Corbin Ade, MD;  Location: [facility];  Service: Endoscopy;  Laterality: N/A;  3:00pm   EYE SURGERY N/A    Phreesia 03/18/2020   HERNIA REPAIR Right 07/2010   Dr. Arlean Hopping   IR IMAGING GUIDED PORT INSERTION  02/09/2021   JOINT REPLACEMENT     LAPAROSCOPIC CHOLECYSTECTOMY  2017   at [facility]   POLYPECTOMY  09/14/2015   Procedure: POLYPECTOMY;  Surgeon: Corbin Ade, MD;  Location: [facility];  Service: Endoscopy;;  ascending colon   right hip replacement  07/2010   went back in sept 2012 to fix   SHOULDER ARTHROSCOPY  2008   left   SPINE SURGERY N/A    Phreesia 03/18/2020   TOTAL HIP REVISION Right 12/17/2012   Procedure: RIGHT TOTAL HIP REVISION;  Surgeon: [name], MD;  Location: [facility];  Service: Orthopedics;  Laterality: Right;   WRIST SURGERY Right 2011   open reduction right wrist.   Family History  Problem Relation Age of Onset   Hypertension Mother    Stroke Mother    Colon cancer Neg Hx    Anesthesia problems Neg Hx    Hypotension Neg Hx    Malignant hyperthermia Neg Hx    Pseudochol deficiency Neg Hx    Gastric cancer Neg Hx    Esophageal cancer Neg Hx    Social History   Socioeconomic History   Marital status: Married    Spouse name: louis   Number of children: 4   Years of education: 12+   Highest education level: Some college, no degree  Occupational History   Occupation: Audiological scientist  - retired   Occupation: non profit  Tobacco Use   Smoking status: Former    Packs/day: 0.25    Years: 25.00    Additional pack years: 0.00    Total pack years: 6.25    Types: Cigarettes    Quit date: 02/07/2003    Years since quitting: 19.5   Smokeless tobacco: Never   Tobacco  comments:    quit in 2004  Vaping Use   Vaping Use: Never used  Substance and Sexual Activity   Alcohol use: No   Drug use: No   Sexual activity: Not Currently    Birth  control/protection: Surgical  Other Topics Concern   Not on file  Social History Narrative   Not on file   Social Determinants of Health   Financial Resource Strain: Low Risk  (12/28/2020)   Overall Financial Resource Strain (CARDIA)    Difficulty of Paying Living Expenses: Not hard at all  Food Insecurity: No Food Insecurity (08/01/2022)   Hunger Vital Sign    Worried About Running Out of Food in the Last Year: Never true    Ran Out of Food in the Last Year: Never true  Transportation Needs: No Transportation Needs (08/01/2022)   PRAPARE - Administrator, Civil Service (Medical): No    Lack of Transportation (Non-Medical): No  Physical Activity: Inactive (04/23/2021)   Exercise Vital Sign    Days of Exercise per Week: 0 days    Minutes of Exercise per Session: 0 min  Stress: Stress Concern Present (04/23/2021)   [employer] of Occupational Health - Occupational Stress Questionnaire    Feeling of Stress : Rather much  Social Connections: Moderately Integrated (12/28/2020)   Social Connection and Isolation Panel [NHANES]    Frequency of Communication with Friends and Family: More than three times a week    Frequency of Social Gatherings with Friends and Family: Once a week    Attends Religious Services: More than 4 times per year    Active Member of [employer] or Organizations: No    Attends Banker Meetings: Never    Marital Status: Married  Catering manager Violence: Not At Risk (07/26/2022)   Humiliation, Afraid, Rape, and Kick questionnaire    Fear of Current or Ex-Partner: No    Emotionally Abused: No    Physically Abused: No    Sexually Abused: No    Review of Systems Constitutional: Patient denies any unintentional weight loss or change in strength lntegumentary: Patient denies any rashes or pruritus Cardiovascular: Patient denies chest pain or syncope Respiratory: Patient denies shortness of breath Gastrointestinal: Patient denies nausea,  vomiting, constipation, or diarrhea Musculoskeletal: Patient denies muscle cramps or weakness Neurologic: Patient denies convulsions or seizures Psychiatric: Patient denies memory problems Allergic/Immunologic: Patient denies recent allergic reaction(s) Hematologic/Lymphatic: Patient denies bleeding tendencies Endocrine: Patient denies heat/cold intolerance  GU: As per HPI.  OBJECTIVE Vitals:   08/16/22 1301  BP: (!) 174/69  Pulse: 72  Temp: 99.8 F (37.7 C)   There is no height or weight on file to calculate BMI.  Physical Examination Constitutional: No obvious distress; patient is non-toxic appearing  Cardiovascular: No visible lower extremity edema.  Respiratory: The patient does not have audible wheezing/stridor; respirations do not appear labored  Gastrointestinal: Abdomen non-distended Musculoskeletal: Normal ROM of UEs  Skin: No obvious rashes/open sores  Neurologic: CN 2-12 grossly intact Psychiatric: Answered questions appropriately with normal affect  Hematologic/Lymphatic/Immunologic: No obvious bruises or sites of spontaneous bleeding  UA: positive for >30 WBC/hpf, >30 RBC/hpf, bacteria (many) PVR: 99 ml  ASSESSMENT Recurrent UTI - Plan: Urinalysis, Routine w reflex microscopic, BLADDER SCAN AMB NON-IMAGING, trimethoprim (TRIMPEX) 100 MG tablet, amoxicillin-clavulanate (AUGMENTIN) 500-125 MG tablet  Atrophic vaginitis  Immunosuppression due to drug therapy (HCC) - Plan: trimethoprim (TRIMPEX) 100 MG tablet, amoxicillin-clavulanate (AUGMENTIN) 500-125 MG tablet  Abnormal urinalysis - Plan: trimethoprim (TRIMPEX) 100  MG tablet, amoxicillin-clavulanate (AUGMENTIN) 500-125 MG tablet  Acute UTI - Plan: trimethoprim (TRIMPEX) 100 MG tablet, amoxicillin-clavulanate (AUGMENTIN) 500-125 MG tablet  UA appears infected; will send for urine culture. Will treat empirically with Augmentin while awaiting culture results and sensitivities.   We discussed the possible  etiologies of recurrent UTls including ascending infection related to intercourse; vaginal atrophy; transmural infection that has been treated incompletely; urinary tract stones; incomplete bladder emptying with urinary stasis; kidney or bladder tumor; urethral diverticulum; and colonization of  vagina and urinary tract with pathologic, adherent organisms.   For UTI prevention we discussed options including: Adequate fluid intake (>1.5 liters/day) to flush out the urinary tract. - Go to the bathroom to urinate every 4-6 hours while awake to minimize urinary stasis / bacterial overgrowth in the bladder. - Proanthocyanidin (PAC) supplement 36 mg daily; must be soluble (insoluble form of PAC will be ineffective). Recommend Ellura. D-mannose 2 g daily Vitamin C supplement Probiotic to maintain healthy vaginal microbiome - Topical vaginal estrogen for vaginal atrophy. - UTI prophylaxis with a daily low dose antibiotic. We discussed the potential risks of prolonged antibiotic treatment particularly with the risks of developing antibiotic resistance.   For management of acute UTI symptoms: - Patient was advised that if/when UTI-like symptoms occur they can call our office to speak with a nurse, who can place an order for urinalysis (with reflex to urine culture). They may then proceed to a [facility] to provide their urine sample. This is required for evaluation in order for their Urology provider to make informed treatment recommendation(s), which may or may not include an antibiotic prescription. - Discussed option to take over-the-counter Pyridium (commonly known under the AZO brand name) for bladder pain. No more than 3 days at a time.  Patient ultimately decided to restart daily low dose Trimethoprim for UTI prophylaxis and will consider OTC supplements for UTI prevention. Handout provided.  Will plan for follow up in 3 months or sooner if needed. Pt verbalized understanding and  agreement. All questions were answered.  PLAN Advised the following: 1. Urine culture. 2. Augmentin 2x/day for 7 days for suspected acute UTI then start Trimethoprim 100 mg daily for UTI prophylaxis. 3. Continue D-mannose and probiotics for UTI prevention. 4. Maintain adequate daily fluid intake. 5. Return in about 3 months (around 11/16/2022) for UA, PVR, & f/u with Evette Georges NP.  Orders Placed This Encounter  Procedures   Urinalysis, Routine w reflex microscopic   BLADDER SCAN AMB NON-IMAGING    It has been explained that the patient is to follow regularly with their PCP in addition to all other providers involved in their care and to follow instructions provided by these respective offices. Patient advised to contact urology clinic if any urologic-pertaining questions, concerns, new symptoms or problems arise in the interim period.  Patient Instructions  Recommendations regarding UTI prevention / management:  When UTI symptoms occur: Call urology office to request order for urine culture. We recommend waiting for urine culture result prior to use of any antibiotics.  For bladder pain/ burning with urination: Over the counter Pyridium (phenazopyridine) as needed (commonly known under the "AZO" brand). No more than 3 days consecutively at a time due to risk for methemoglobinemia, liver function issues, and bone health damage with long term use of Pyridium.  Routine use for UTI prevention: - Low dose antibiotic daily for UTI prophylaxis. Adequate fluid intake (>1.5 liters/day) to flush out the urinary tract. - Go to the bathroom to  urinate every 4-6 hours while awake to minimize urinary stasis / bacterial overgrowth in the bladder. - Proanthocyanidin (PAC) supplement 36 mg daily; must be soluble (insoluble form of PAC will be ineffective). Recommended brand: Ellura. This is an over-the-counter supplement (often must be found/ purchased online) supplement derived from cranberries  with concentrated active component: Proanthocyanidin (PAC) 36 mg daily. Decreases bacterial adherence to bladder lining. Not recommended for patients with interstitial cystitis due to acidity. - D-mannose powder (2 grams daily). This is an over-the-counter supplement which decreases bacterial adherence to bladder lining (it is a sugar that inhibits bacterial adherence to urothelial cells by binding to the pili of enteric bacteria). Take as per manufacturer recommendation. Can be used as an alternative or in addition to the concentrated cranberry supplement. Not recommended for diabetic patients due to sugar content. - Vitamin C supplement to acidify urine to minimize bacterial growth. Not recommended for patients with interstitial cystitis due to acidity. - Probiotic to maintain healthy vaginal microbiome to suppress bacteria at urethral opening. Brand recommendations: Darrold Junker (includes probiotic & D-mannose ), Feminine Balance (highest concentration of lactobacillus) or Hyperbiotic Pro 15.  Note for patients with diabetes: You may read about D-mannose powder for UTI prevention. That is an over the-counter supplement which decreases bacterial adherence to bladder lining. I would NOT advise that for you as a person with diabetes due to its sugar content.   Electronically signed by:  Donnita Falls, FNP   08/16/22    2:09 PM

## 2022-08-16 NOTE — Patient Instructions (Signed)
Recommendations regarding UTI prevention / management:  When UTI symptoms occur: Call urology office to request order for urine culture. We recommend waiting for urine culture result prior to use of any antibiotics.  For bladder pain/ burning with urination: Over the counter Pyridium (phenazopyridine) as needed (commonly known under the "AZO" brand). No more than 3 days consecutively at a time due to risk for methemoglobinemia, liver function issues, and bone health damage with long term use of Pyridium.  Routine use for UTI prevention: - Low dose antibiotic daily for UTI prophylaxis. Adequate fluid intake (>1.5 liters/day) to flush out the urinary tract. - Go to the bathroom to urinate every 4-6 hours while awake to minimize urinary stasis / bacterial overgrowth in the bladder. - Proanthocyanidin (PAC) supplement 36 mg daily; must be soluble (insoluble form of PAC will be ineffective). Recommended brand: Ellura. This is an over-the-counter supplement (often must be found/ purchased online) supplement derived from cranberries with concentrated active component: Proanthocyanidin (PAC) 36 mg daily. Decreases bacterial adherence to bladder lining. Not recommended for patients with interstitial cystitis due to acidity. - D-mannose powder (2 grams daily). This is an over-the-counter supplement which decreases bacterial adherence to bladder lining (it is a sugar that inhibits bacterial adherence to urothelial cells by binding to the pili of enteric bacteria). Take as per manufacturer recommendation. Can be used as an alternative or in addition to the concentrated cranberry supplement. Not recommended for diabetic patients due to sugar content. - Vitamin C supplement to acidify urine to minimize bacterial growth. Not recommended for patients with interstitial cystitis due to acidity. - Probiotic to maintain healthy vaginal microbiome to suppress bacteria at urethral opening. Brand recommendations: Darrold Junker  (includes probiotic & D-mannose ), Feminine Balance (highest concentration of lactobacillus) or Hyperbiotic Pro 15.  Note for patients with diabetes: You may read about D-mannose powder for UTI prevention. That is an over the-counter supplement which decreases bacterial adherence to bladder lining. I would NOT advise that for you as a person with diabetes due to its sugar content.

## 2022-08-18 DIAGNOSIS — C946 Myelodysplastic disease, not classified: Secondary | ICD-10-CM | POA: Diagnosis not present

## 2022-08-18 DIAGNOSIS — I129 Hypertensive chronic kidney disease with stage 1 through stage 4 chronic kidney disease, or unspecified chronic kidney disease: Secondary | ICD-10-CM | POA: Diagnosis not present

## 2022-08-18 DIAGNOSIS — N184 Chronic kidney disease, stage 4 (severe): Secondary | ICD-10-CM | POA: Diagnosis not present

## 2022-08-18 DIAGNOSIS — D630 Anemia in neoplastic disease: Secondary | ICD-10-CM | POA: Diagnosis not present

## 2022-08-18 DIAGNOSIS — N179 Acute kidney failure, unspecified: Secondary | ICD-10-CM | POA: Diagnosis not present

## 2022-08-18 DIAGNOSIS — D631 Anemia in chronic kidney disease: Secondary | ICD-10-CM | POA: Diagnosis not present

## 2022-08-19 DIAGNOSIS — Z515 Encounter for palliative care: Secondary | ICD-10-CM | POA: Diagnosis not present

## 2022-08-19 DIAGNOSIS — D47Z9 Other specified neoplasms of uncertain behavior of lymphoid, hematopoietic and related tissue: Secondary | ICD-10-CM | POA: Diagnosis not present

## 2022-08-22 ENCOUNTER — Other Ambulatory Visit: Payer: Self-pay

## 2022-08-22 ENCOUNTER — Other Ambulatory Visit (HOSPITAL_COMMUNITY): Payer: Self-pay

## 2022-08-22 DIAGNOSIS — D471 Chronic myeloproliferative disease: Secondary | ICD-10-CM

## 2022-08-22 DIAGNOSIS — N179 Acute kidney failure, unspecified: Secondary | ICD-10-CM | POA: Diagnosis not present

## 2022-08-22 DIAGNOSIS — D631 Anemia in chronic kidney disease: Secondary | ICD-10-CM | POA: Diagnosis not present

## 2022-08-22 DIAGNOSIS — N184 Chronic kidney disease, stage 4 (severe): Secondary | ICD-10-CM | POA: Diagnosis not present

## 2022-08-22 DIAGNOSIS — D630 Anemia in neoplastic disease: Secondary | ICD-10-CM | POA: Diagnosis not present

## 2022-08-22 DIAGNOSIS — I129 Hypertensive chronic kidney disease with stage 1 through stage 4 chronic kidney disease, or unspecified chronic kidney disease: Secondary | ICD-10-CM | POA: Diagnosis not present

## 2022-08-22 DIAGNOSIS — C946 Myelodysplastic disease, not classified: Secondary | ICD-10-CM | POA: Diagnosis not present

## 2022-08-22 MED ORDER — MOMELOTINIB DIHYDROCHLORIDE 100 MG PO TABS
100.0000 mg | ORAL_TABLET | Freq: Every day | ORAL | 3 refills | Status: DC
Start: 2022-08-22 — End: 2022-12-26
  Filled 2022-08-22 (×2): qty 30, 30d supply, fill #0
  Filled 2022-09-16: qty 30, 30d supply, fill #1
  Filled 2022-10-11: qty 30, 30d supply, fill #2
  Filled 2022-11-14: qty 30, 30d supply, fill #3

## 2022-08-23 ENCOUNTER — Inpatient Hospital Stay: Payer: Medicare Other | Admitting: Internal Medicine

## 2022-08-23 ENCOUNTER — Other Ambulatory Visit: Payer: Self-pay

## 2022-08-23 ENCOUNTER — Other Ambulatory Visit (HOSPITAL_COMMUNITY): Payer: Self-pay

## 2022-08-24 ENCOUNTER — Encounter: Payer: Self-pay | Admitting: Family Medicine

## 2022-08-24 ENCOUNTER — Other Ambulatory Visit: Payer: Self-pay

## 2022-08-24 ENCOUNTER — Ambulatory Visit: Payer: Medicare Other | Admitting: Family Medicine

## 2022-08-24 VITALS — BP 128/82 | HR 56 | Resp 16 | Ht 65.0 in | Wt 113.8 lb

## 2022-08-24 DIAGNOSIS — D471 Chronic myeloproliferative disease: Secondary | ICD-10-CM

## 2022-08-24 DIAGNOSIS — N390 Urinary tract infection, site not specified: Secondary | ICD-10-CM | POA: Diagnosis not present

## 2022-08-24 DIAGNOSIS — F324 Major depressive disorder, single episode, in partial remission: Secondary | ICD-10-CM

## 2022-08-24 DIAGNOSIS — Z7409 Other reduced mobility: Secondary | ICD-10-CM

## 2022-08-24 DIAGNOSIS — Z09 Encounter for follow-up examination after completed treatment for conditions other than malignant neoplasm: Secondary | ICD-10-CM | POA: Diagnosis not present

## 2022-08-24 DIAGNOSIS — B079 Viral wart, unspecified: Secondary | ICD-10-CM

## 2022-08-24 DIAGNOSIS — R2681 Unsteadiness on feet: Secondary | ICD-10-CM | POA: Diagnosis not present

## 2022-08-24 DIAGNOSIS — N179 Acute kidney failure, unspecified: Secondary | ICD-10-CM | POA: Diagnosis not present

## 2022-08-24 DIAGNOSIS — R0989 Other specified symptoms and signs involving the circulatory and respiratory systems: Secondary | ICD-10-CM

## 2022-08-24 NOTE — Patient Instructions (Addendum)
F/u in 6 weeks, call if you need me sooner  Your blood pressure is good at this visit You report only taking metoprolol one twice daily Please keep appt with local cardiology  Discuss moles with Dr Annamarie Dawley are referred urgently to therapist in this office   Manual wheelchair is ordered so you can get out and go everywhere Thanks for choosing [facility], we consider it a privelige to serve you.

## 2022-08-25 ENCOUNTER — Telehealth: Payer: Self-pay

## 2022-08-25 ENCOUNTER — Other Ambulatory Visit: Payer: Self-pay

## 2022-08-25 DIAGNOSIS — I129 Hypertensive chronic kidney disease with stage 1 through stage 4 chronic kidney disease, or unspecified chronic kidney disease: Secondary | ICD-10-CM | POA: Diagnosis not present

## 2022-08-25 DIAGNOSIS — D631 Anemia in chronic kidney disease: Secondary | ICD-10-CM | POA: Diagnosis not present

## 2022-08-25 DIAGNOSIS — D630 Anemia in neoplastic disease: Secondary | ICD-10-CM | POA: Diagnosis not present

## 2022-08-25 DIAGNOSIS — N179 Acute kidney failure, unspecified: Secondary | ICD-10-CM | POA: Diagnosis not present

## 2022-08-25 DIAGNOSIS — D471 Chronic myeloproliferative disease: Secondary | ICD-10-CM

## 2022-08-25 DIAGNOSIS — N184 Chronic kidney disease, stage 4 (severe): Secondary | ICD-10-CM | POA: Diagnosis not present

## 2022-08-25 DIAGNOSIS — C946 Myelodysplastic disease, not classified: Secondary | ICD-10-CM | POA: Diagnosis not present

## 2022-08-25 NOTE — Telephone Encounter (Signed)
Do you want keflex 250 mg po daily?

## 2022-08-25 NOTE — Progress Notes (Unsigned)
Renee Waters     MRN: [id number]      DOB: 07-12-1945  Chief Complaint  Patient presents with   Hospitalization Follow-up    Discharged 6/21. Concerned about blood pressure management. Has been having bumps under her arms and around the back of her neck. Sometimes itchy    HPI Renee Waters is here for follow up of recent hospitalization, from 6/18/to 07/29/2022 for AKI, fever  and MPN and positive blood culture Concerns as above Requests therapy for feelings of depression and anxiety associated with her chronic disease   Patient/Guardian was advised Release of Information must be obtained prior to any record release in order to collaborate their care with an outside provider. Patient/Guardian was advised if they have not already done so to contact the registration department to sign all necessary forms in order for [place] to release information regarding their care.   Consent: Patient/Guardian gives verbal consent for treatment and assignment of benefits for services provided during this visit. Patient/Guardian expressed understanding and agreed to proceed.   Requests manual wheelchair for transport  Notes warts increasing in size in left armpit, an a few new ones on the back of her  left neck ROS Denies recent fever or chills. Denies sinus pressure, nasal congestion, ear pain or sore throat. Denies chest congestion, productive cough or wheezing. Denies chest pains, palpitations and leg swelling C/o poor appetite.   C/o urinary frequency. Chronic  joint pain,  and limitation in mobility.requests manual wheelchair to inmcrease mobility outside of her home C/o uncontrolled  depression and anxiety alos has  insomnia.Not suicidal or homicidal, great family support, overwhelmed by her chronic illness and the fact that it is terminal/ untreatable C/o warts as above  PE  BP 128/82   Pulse (!) 56   Resp 16   Ht 5\' 5"  (1.651 m)   Wt 113 lb 12.8 oz (51.6 kg)   SpO2 92%   BMI 18.94 kg/m    Patient alert and oriented and in no cardiopulmonary distress.  HEENT: No facial asymmetry, EOMI,     Neck supple .  Chest: Clear to auscultation bilaterally.Decreased air entry  CVS: S1, S2 no murmurs, no S3.Regular rate.  ABD: Soft non tender.   Ext: No edema  MS: decreased  ROM spine, shoulders, hips and knees.  Skin: Intact, warts noted in left armpit and left upper back  Psych: Good eye contact, normal affect. Memory intact  anxious and  depressed appearing.tearful at times  CNS: CN 2-12 intact, power,  normal throughout.no focal deficits noted.   Assessment & Plan  Hospital discharge follow-up Patient in for follow up of recent hospitalization. Discharge summary, and laboratory and radiology data are reviewed, and any questions or concerns  are discussed. Specific issues requiring follow up are specifically addressed.   Impaired mobility Generalized weakness wit severe arthritis of spine limits ability to safely ambulate unassisted, will benefit from manual wheelchair so she can be taken outsoide of her home whenever desired, will improve quality of life, manual wheelchair requested  Unsteady gait High fall  risk needs manual wheelchair  with limitation in independent safe ambulation,   AKI (acute kidney injury) (HCC) Improved at time of d/c will rept in next 1 to 2 weeks, has upcoming appt with Oncology  Depression Refer therapist pe rpt request , howveer needs therapist who will do video calls , unable to travel much  Labile hypertension F/u with local cardiologist  Warts Noted increased number and size of warts  in armpit and left upper back,likely related to underlying  malignancy, will defer to Oncology re additional f/u   Recurrent UTI Maintained on daily antibiotic  MPN (myeloproliferative neoplasm) (HCC) Followed by Oncology and being treated , currently in hospice and acknowledges that her disease is  incurable per hospice notes

## 2022-08-25 NOTE — Telephone Encounter (Signed)
Patient called stating she continues to have diarrhea with the trimethoprim and request an alternative. Please advise.

## 2022-08-26 MED ORDER — CEPHALEXIN 250 MG PO CAPS: 250.0000 mg | ORAL_CAPSULE | Freq: Every day | ORAL | 11 refills | Status: DC

## 2022-08-26 NOTE — Telephone Encounter (Signed)
Husband answered and states that patient is sleep. Husband made aware that message will be sent via mychart. Husband states he will make wife aware.

## 2022-08-26 NOTE — Telephone Encounter (Signed)
Patient left vm not understanding why she was being put on Keflex 250 mg. Return call to patient with no answer, left vm for return call.

## 2022-08-30 ENCOUNTER — Encounter: Payer: Self-pay | Admitting: Family Medicine

## 2022-08-30 ENCOUNTER — Other Ambulatory Visit: Payer: Self-pay

## 2022-08-30 DIAGNOSIS — B079 Viral wart, unspecified: Secondary | ICD-10-CM | POA: Insufficient documentation

## 2022-08-30 DIAGNOSIS — N390 Urinary tract infection, site not specified: Secondary | ICD-10-CM

## 2022-08-30 DIAGNOSIS — N179 Acute kidney failure, unspecified: Secondary | ICD-10-CM | POA: Insufficient documentation

## 2022-08-30 DIAGNOSIS — Z7409 Other reduced mobility: Secondary | ICD-10-CM | POA: Insufficient documentation

## 2022-08-30 MED ORDER — TRIMETHOPRIM 100 MG PO TABS
100.0000 mg | ORAL_TABLET | Freq: Every day | ORAL | 11 refills | Status: DC
Start: 2022-08-30 — End: 2023-09-04

## 2022-08-30 MED ORDER — MIRTAZAPINE 15 MG PO TABS: 15.0000 mg | ORAL_TABLET | Freq: Every day | ORAL | 5 refills | Status: DC

## 2022-08-30 NOTE — Assessment & Plan Note (Signed)
F/u with local cardiologist

## 2022-08-30 NOTE — Assessment & Plan Note (Signed)
High fall  risk needs manual wheelchair  with limitation in independent safe ambulation,

## 2022-08-30 NOTE — Telephone Encounter (Addendum)
Patient's daughter Kyra Leyland called in with question about Rx Keflex.  Kyra Leyland states their was some sort of misunderstanding and need clarification.Kyra Leyland states that patient never took the trimpex medication and it was not the medication that gave mom diarrhea. Kyra Leyland states that her mom had diarrhea prior to being prescribe Trimpex. Consult with Maralyn Sago about patient's medication. Keflex discounted and Trimpex reorder Per Maralyn Sago. Sarah prefer for patient to take Trimpex nightly to prevent UTI after completing ABT Augmentin BID X 7 days. Kyra Leyland is made aware for patient to take Trimpex nightly to prevent UTI.

## 2022-08-30 NOTE — Assessment & Plan Note (Signed)
Refer therapist pe rpt request , howveer needs therapist who will do video calls , unable to travel much

## 2022-08-30 NOTE — Assessment & Plan Note (Signed)
Noted increased number and size of warts in armpit and left upper back,likely related to underlying  malignancy, will defer to Oncology re additional f/u

## 2022-08-30 NOTE — Assessment & Plan Note (Signed)
Generalized weakness wit severe arthritis of spine limits ability to safely ambulate unassisted, will benefit from manual wheelchair so she can be taken outsoide of her home whenever desired, will improve quality of life, manual wheelchair requested

## 2022-08-30 NOTE — Assessment & Plan Note (Signed)
Maintained on daily antibiotic

## 2022-08-30 NOTE — Assessment & Plan Note (Signed)
Patient in for follow up of recent hospitalization. Discharge summary, and laboratory and radiology data are reviewed, and any questions or concerns  are discussed. Specific issues requiring follow up are specifically addressed.  

## 2022-08-30 NOTE — Assessment & Plan Note (Signed)
Improved at time of d/c will rept in next 1 to 2 weeks, has upcoming appt with Oncology

## 2022-08-30 NOTE — Assessment & Plan Note (Addendum)
Followed by Oncology and being treated , currently in hospice and acknowledges that her disease is  incurable per hospice notes

## 2022-08-31 DIAGNOSIS — I129 Hypertensive chronic kidney disease with stage 1 through stage 4 chronic kidney disease, or unspecified chronic kidney disease: Secondary | ICD-10-CM | POA: Diagnosis not present

## 2022-08-31 DIAGNOSIS — D631 Anemia in chronic kidney disease: Secondary | ICD-10-CM | POA: Diagnosis not present

## 2022-08-31 DIAGNOSIS — N179 Acute kidney failure, unspecified: Secondary | ICD-10-CM | POA: Diagnosis not present

## 2022-08-31 DIAGNOSIS — N184 Chronic kidney disease, stage 4 (severe): Secondary | ICD-10-CM | POA: Diagnosis not present

## 2022-08-31 DIAGNOSIS — C946 Myelodysplastic disease, not classified: Secondary | ICD-10-CM | POA: Diagnosis not present

## 2022-08-31 DIAGNOSIS — D630 Anemia in neoplastic disease: Secondary | ICD-10-CM | POA: Diagnosis not present

## 2022-09-01 ENCOUNTER — Other Ambulatory Visit: Payer: Self-pay | Admitting: Internal Medicine

## 2022-09-01 ENCOUNTER — Inpatient Hospital Stay: Payer: Medicare Other

## 2022-09-01 ENCOUNTER — Inpatient Hospital Stay (HOSPITAL_BASED_OUTPATIENT_CLINIC_OR_DEPARTMENT_OTHER): Payer: Medicare Other | Admitting: Hematology

## 2022-09-01 DIAGNOSIS — M3214 Glomerular disease in systemic lupus erythematosus: Secondary | ICD-10-CM | POA: Diagnosis not present

## 2022-09-01 DIAGNOSIS — R161 Splenomegaly, not elsewhere classified: Secondary | ICD-10-CM | POA: Diagnosis not present

## 2022-09-01 DIAGNOSIS — D649 Anemia, unspecified: Secondary | ICD-10-CM

## 2022-09-01 DIAGNOSIS — D471 Chronic myeloproliferative disease: Secondary | ICD-10-CM | POA: Diagnosis not present

## 2022-09-01 DIAGNOSIS — Z87891 Personal history of nicotine dependence: Secondary | ICD-10-CM | POA: Diagnosis not present

## 2022-09-01 DIAGNOSIS — N183 Chronic kidney disease, stage 3 unspecified: Secondary | ICD-10-CM | POA: Diagnosis not present

## 2022-09-01 DIAGNOSIS — D72829 Elevated white blood cell count, unspecified: Secondary | ICD-10-CM

## 2022-09-01 DIAGNOSIS — I129 Hypertensive chronic kidney disease with stage 1 through stage 4 chronic kidney disease, or unspecified chronic kidney disease: Secondary | ICD-10-CM | POA: Diagnosis not present

## 2022-09-01 LAB — COMPREHENSIVE METABOLIC PANEL
ALT: 18 U/L (ref 0–44)
AST: 30 U/L (ref 15–41)
Albumin: 3.8 g/dL (ref 3.5–5.0)
Alkaline Phosphatase: 130 U/L — ABNORMAL HIGH (ref 38–126)
Anion gap: 6 (ref 5–15)
BUN: 34 mg/dL — ABNORMAL HIGH (ref 8–23)
CO2: 23 mmol/L (ref 22–32)
Calcium: 8.4 mg/dL — ABNORMAL LOW (ref 8.9–10.3)
Chloride: 108 mmol/L (ref 98–111)
Creatinine, Ser: 1.99 mg/dL — ABNORMAL HIGH (ref 0.44–1.00)
GFR, Estimated: 26 mL/min — ABNORMAL LOW (ref >60)
Glucose, Bld: 115 mg/dL — ABNORMAL HIGH (ref 70–99)
Potassium: 3.9 mmol/L (ref 3.5–5.1)
Sodium: 137 mmol/L (ref 135–145)
Total Bilirubin: 0.8 mg/dL (ref 0.3–1.2)
Total Protein: 7.8 g/dL (ref 6.5–8.1)

## 2022-09-01 LAB — CBC WITH DIFFERENTIAL/PLATELET
Abs Immature Granulocytes: 13.9 10*3/uL — ABNORMAL HIGH (ref 0.00–0.07)
Band Neutrophils: 8 %
Basophils Absolute: 0 10*3/uL (ref 0.0–0.1)
Basophils Relative: 0 %
Eosinophils Absolute: 0 10*3/uL (ref 0.0–0.5)
Eosinophils Relative: 0 %
HCT: 27.1 % — ABNORMAL LOW (ref 36.0–46.0)
Hemoglobin: 8.5 g/dL — ABNORMAL LOW (ref 12.0–15.0)
Lymphocytes Relative: 5 %
Lymphs Abs: 2 10*3/uL (ref 0.7–4.0)
MCH: 30.7 pg (ref 26.0–34.0)
MCHC: 31.4 g/dL (ref 30.0–36.0)
MCV: 97.8 fL (ref 80.0–100.0)
Metamyelocytes Relative: 13 %
Monocytes Absolute: 3.3 10*3/uL — ABNORMAL HIGH (ref 0.1–1.0)
Monocytes Relative: 8 %
Myelocytes: 20 %
Neutro Abs: 21.7 10*3/uL — ABNORMAL HIGH (ref 1.7–7.7)
Neutrophils Relative %: 45 %
Platelets: 240 10*3/uL (ref 150–400)
Promyelocytes Relative: 1 %
RBC: 2.77 MIL/uL — ABNORMAL LOW (ref 3.87–5.11)
RDW: 20.2 % — ABNORMAL HIGH (ref 11.5–15.5)
WBC: 40.9 10*3/uL — ABNORMAL HIGH (ref 4.0–10.5)
nRBC: 5.4 % — ABNORMAL HIGH (ref 0.0–0.2)
nRBC: 7 /100 WBC — ABNORMAL HIGH

## 2022-09-01 LAB — SAMPLE TO BLOOD BANK

## 2022-09-01 LAB — MAGNESIUM: Magnesium: 1.9 mg/dL (ref 1.7–2.4)

## 2022-09-01 MED ORDER — HEPARIN SOD (PORK) LOCK FLUSH 100 UNIT/ML IV SOLN
500.0000 [IU] | Freq: Once | INTRAVENOUS | Status: AC
  Administered 2022-09-01: 500 [IU] via INTRAVENOUS

## 2022-09-01 MED ORDER — SODIUM CHLORIDE 0.9% FLUSH
10.0000 mL | INTRAVENOUS | Status: DC | PRN
  Administered 2022-09-01: 10 mL via INTRAVENOUS

## 2022-09-01 NOTE — Patient Instructions (Addendum)
[facility]  Discharge Instructions  You were seen and examined today by Dr. Ellin Saba.  Dr. Ellin Saba discussed your most recent lab work which revealed that everything looks good and stable.  Continue taking the [place]ja[place] as prescribed.  Follow-up as scheduled in 1 month.    Thank you for choosing [facility] to provide your oncology and hematology care.   To afford each patient quality time with our provider, please arrive at least 15 minutes before your scheduled appointment time. You may need to reschedule your appointment if you arrive late (10 or more minutes). Arriving late affects you and other patients whose appointments are after yours.  Also, if you miss three or more appointments without notifying the office, you may be dismissed from the clinic at the provider's discretion.    Again, thank you for choosing [facility].  Our hope is that these requests will decrease the amount of time that you wait before being seen by our physicians.   If you have a lab appointment with the Cancer Center - please note that after April 8th, all labs will be drawn in the cancer center.  You do not have to check in or register with the main entrance as you have in the past but will complete your check-in at the cancer center.            _____________________________________________________________  Should you have questions after your visit to [facility], please contact our office at [phone] and follow the prompts.  Our office hours are 8:00 a.m. to 4:30 p.m. Monday - Thursday and 8:00 a.m. to 2:30 p.m. Friday.  Please note that voicemails left after 4:00 p.m. may not be returned until the following business day.  We are closed weekends and all major holidays.  You do have access to a nurse 24-7, just call the main number to the clinic [phone] and do not press any options, hold on the line and a nurse will  answer the phone.    For prescription refill requests, have your pharmacy contact our office and allow 72 hours.    Masks are no longer required in the cancer centers. If you would like for your care team to wear a mask while they are taking care of you, please let them know. You may have one support person who is at least 77 years old accompany you for your appointments.

## 2022-09-01 NOTE — Progress Notes (Signed)
Lorenza Burton presented for Portacath access and flush. Portacath located right chest wall accessed with  H 20 needle. Good blood return present. Portacath flushed with 20ml NS and 500U/19ml Heparin and needle removed intact. Procedure without incident. Patient tolerated procedure well.

## 2022-09-01 NOTE — Progress Notes (Signed)
[facility] [address], [place] [address]    Clinic Day:  09/01/2022  Referring physician: Kerri Perches, MD  Patient Care Team: Kerri Perches, MD as PCP - General Branch, Alben Spittle, MD as PCP - Cardiology (Cardiology) Jena Gauss Gerrit Friends, MD (Gastroenterology) Vickki Hearing, MD as Consulting Physician (Orthopedic Surgery) [name], MD as Medical Oncologist (Oncology) Therese Sarah, RN as Oncology Nurse Navigator (Oncology) Clinton Gallant, RN as [facility] Care Management   ASSESSMENT & PLAN:   Assessment: 1. MPL positive prefibrotic/early primary myelofibrosis: - CBC on 11/06/2020 with white count 75.6, differential 59% neutrophils, 12% monocytes, 4% lymphocytes, 1 percentage of eosinophils and basophils, 6% band neutrophils, 12% myelocytes, 1% promyelocytes, 29% blasts. - Pathologist review of blood smear reported as leukoerythroblastic reaction. - 25 pound weight loss in the last 6 months, unintentional.  Decreased appetite.  Reports fatigue for the last few months.  Reports night sweats x3 in the last 1 month. - Bone marrow biopsy on 11/18/2020 consistent with granulocytic proliferation with differential including CMML versus myeloproliferative disorder. - BCR/ABL was negative. 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 - NGS myeloid panel shows mutations in ASXL1, MPL, TET2, EZH2 mutations. - Spleen ultrasound on 12/16/2020 shows mildly enlarged measuring 12.4 x 9.6 x 7 cm with volume of 435 cc. - PDGFR alpha, beta and FGFR 1 was negative. - She was evaluated by Dr. Lowell Guitar at [facility].  Slides were reviewed at [facility] hematopathology.  They thought it was less likely CMML and more likely MPL positive myeloproliferative neoplasm.  Dr. Lowell Guitar has recommended initiate Jakafi. - Given anemia, B symptoms, cytogenetics with MPL, ASLX1, normal karyotype she is intermediate risk on  GIPPS at high risk MIPSS70+ - Ruxolitinib 10 mg twice daily started on 02/16/2021.  Dose increased to 15 mg twice daily on 05/18/2021. - CTAP on 04/07/2021: Hepatomegaly and splenomegaly measuring 14.3 cm.  No other pathology.  No spleen infarcts. -Ruxolitinib dose increased to 15 mg twice daily on 09/20/2021. - Ultrasound spleen (09/21/2021): 13.6 cm with volume 532 mL.  This is slightly improved from previous volume. - Bone marrow biopsy (09/09/2021): Material is limited but overall features consistent with myeloid neoplasm.  No increase in blasts.  Erythroid precursors appear decreased but megakaryocytes are variably evident with clusters and small forms.  Reticulin's shows variable increase in reticulin fibers including areas with prominent increase.  Chromosome analysis was normal. - Tapering doses of ruxolitinib started on 12/24/2021, last dose on 01/23/2022 - Evaluated by Dr. Lowell Guitar on 12/24/2021. - Momelotinib 100 mg daily started on 01/26/2022, dose increased to 150 mg daily on 07/19/2022 due to generalized itching.  Dose decreased to 100 mg daily on 08/09/2022.  2.  Social/family history: - She lives at home and is able to do all her ADLs and IADLs although she is getting tired lately.  She reports quitting smoking 6 months ago and smoked 1 pack/week for 43 years. - She believes that her mother had some kind of leukemia.  No other malignancies.    Plan: MPL positive myeloproliferative neoplasm: - At last visit on 08/09/2022, I have decreased to momelotinib to 100 mg daily due to generalized weakness and recurrent hospitalizations. - She reports tolerating momelotinib 100 mg daily  very well. - She reports dizziness.  Blood pressure today is 102/62.  Medications include metoprolol 25 mg twice daily.  She is seeing cardiology early next week. - She is  also having recurrent UTIs and has seen urology and was started on prophylactic trimethoprim 100 mg daily about 2 days ago. - Last PRBC transfusion was  on 08/10/2022. - Reviewed labs from today: Hemoglobin 8.5, platelet plate count 147 and white count 40.9.  LFTs are normal.  Creatinine stable at 1.99. - Spleen size about 6 fingerbreadths below the left costal margin, medial side extending to the umbilicus. - Recommend continuing momelotinib 100 mg daily. - Follow-up in 4 weeks with labs.  Will also obtain ultrasound of the spleen to document any improvement in the spleen size.  2.  Decreased appetite: - She reports that she is eating well.  She lost couple of pounds from a month ago.  Will closely monitor.  3.  CKD: - Creatinine is 1.99 and stable.  4.  Hypokalemia: - Continue potassium supplements.  Potassium today normal at 3.9.   5.  Generalized itching: - Itching has improved since started back on momelotinib.    Orders Placed This Encounter  Procedures   US SPLEEN (ABDOMEN LIMITED)    Standing Status:   Future    Standing Expiration Date:   09/01/2023    Order Specific Question:   Reason for Exam (SYMPTOM  OR DIAGNOSIS REQUIRED)    Answer:   spleenomegaly    Order Specific Question:   Preferred imaging location?    Answer:   [facility]    Order Specific Question:   Release to patient    Answer:   Immediate   CBC with Differential/Platelet    Standing Status:   Future    Standing Expiration Date:   09/01/2023    Order Specific Question:   Release to patient    Answer:   Immediate   Comprehensive metabolic panel    Standing Status:   Future    Standing Expiration Date:   09/01/2023    Order Specific Question:   Release to patient    Answer:   Immediate   Uric acid    Standing Status:   Future    Standing Expiration Date:   09/01/2023   Magnesium    Standing Status:   Future    Standing Expiration Date:   09/01/2023    Order Specific Question:   Release to patient    Answer:   Immediate   Sample to Blood Bank(Blood Bank Hold)    Standing Status:   Future    Standing Expiration Date:   09/01/2023       [name],acting as a scribe for [name], MD.,have documented all relevant documentation on the behalf of [name], MD,as directed by  [name], MD while in the presence of [name], MD.  I, [name] MD, have reviewed the above documentation for accuracy and completeness, and I agree with the above.    [name], MD   7/25/20244:11 PM  CHIEF COMPLAINT:   Diagnosis: MPL positive myeloproliferative neoplasm    Cancer Staging  No matching staging information was found for the patient.    Prior Therapy: Jakafi 10 mg twice daily   Current Therapy:  Momelotinib 100 mg daily    HISTORY OF PRESENT ILLNESS:   Oncology History   No history exists.     INTERVAL HISTORY:   Renee Waters is a 77 y.o. female presenting to clinic today for follow up of MPL positive myeloproliferative neoplasm. She was last seen by me on 08/09/22.  Today, she states that she is doing well overall. Her appetite  level is at 80%. Her energy level is at 40%.  PAST MEDICAL HISTORY:   Past Medical History: Past Medical History:  Diagnosis Date   Acute cholangitis    Acute respiratory failure with hypoxia (HCC) 09/01/2021   Allergy    Anemia    Anxiety    Arthritis    Phreesia 03/18/2020   Barrett's esophagus    Cataract    Chronic back pain    Chronic neck pain    CKD (chronic kidney disease) stage 3, GFR 30-59 ml/min (HCC) 10/29/2020   Depression    Genital herpes    GERD (gastroesophageal reflux disease)    H/O degenerative disc disease    History of blood transfusion    Hypertension    Hypoxia 12/01/2021   Insomnia    Lupus (systemic lupus erythematosus) (HCC)    Neuromuscular disorder (HCC)    Osteoarthritis    S/P colonoscopy 07/2003   normal, no polyps   S/P endoscopy June 2005, Oct 2009   2005: short-segment Barrett's, 2009: short-segment Barrett's   Upper respiratory tract infection 07/09/2020   UTI  (lower urinary tract infection) 11/2012    Surgical History: Past Surgical History:  Procedure Laterality Date   ABDOMINAL HYSTERECTOMY     BACK SURGERY     BIOPSY N/A 03/20/2014   Procedure: BIOPSY;  Surgeon: Corbin Ade, MD;  Location: [facility];  Service: Endoscopy;  Laterality: N/A;   BIOPSY  09/14/2015   Procedure: BIOPSY;  Surgeon: Corbin Ade, MD;  Location: [facility];  Service: Endoscopy;;  esophageal and gastric   BIOPSY  07/23/2019   Procedure: BIOPSY;  Surgeon: Corbin Ade, MD;  Location: [facility];  Service: Endoscopy;;  esophageal    CARPAL TUNNEL RELEASE Left 2013   cervical disectomy  2002   CESAREAN SECTION N/A    Phreesia 03/18/2020   CHOLECYSTECTOMY     with lysis of adhesions for sbo; "ruptured gallbladder".   COLONOSCOPY  11/09/2011   RMR: Melanosis coli   COLONOSCOPY WITH PROPOFOL N/A 09/14/2015   Dr. Jena Gauss: diverticulosis, 3mm TA removed. next TCS 09/2020.    COLONOSCOPY WITH PROPOFOL N/A 04/30/2020   Procedure: COLONOSCOPY WITH PROPOFOL;  Surgeon: Corbin Ade, MD;  Location: [facility];  Service: Endoscopy;  Laterality: N/A;  PM (ASA 3)   DENTAL SURGERY  11/2015   multiple tooth extraction   ESOPHAGOGASTRODUODENOSCOPY  11/29/2007   salmon-colored  tongue   longest stable at  3 cm, distal esophagus as described previously status post biopsy/ Hiatal hernia, otherwise normal stomach D1 and D2   ESOPHAGOGASTRODUODENOSCOPY  01/06/11   short segment Barrett's esophagus s/p bx/Hiatal hernia   ESOPHAGOGASTRODUODENOSCOPY (EGD) WITH PROPOFOL N/A 03/20/2014   WGN:FAOZHYQM distal esophagus short segment barrett's, bx with no dysplasia. next egd in 03/2017   ESOPHAGOGASTRODUODENOSCOPY (EGD) WITH PROPOFOL N/A 09/14/2015   Dr. Jena Gauss: Barrett's without dysplasia, gastritis benign bx, hiatal hernia. next EGD 09/2018.   ESOPHAGOGASTRODUODENOSCOPY (EGD) WITH PROPOFOL N/A 07/23/2019   Procedure: ESOPHAGOGASTRODUODENOSCOPY (EGD) WITH PROPOFOL;  Surgeon: Corbin Ade,  MD;  Location: [facility];  Service: Endoscopy;  Laterality: N/A;  3:00pm   EYE SURGERY N/A    Phreesia 03/18/2020   HERNIA REPAIR Right 07/2010   Dr. Arlean Hopping   IR IMAGING GUIDED PORT INSERTION  02/09/2021   JOINT REPLACEMENT     LAPAROSCOPIC CHOLECYSTECTOMY  2017   at [facility]   POLYPECTOMY  09/14/2015   Procedure: POLYPECTOMY;  Surgeon: Corbin Ade, MD;  Location: [facility];  Service: Endoscopy;;  ascending colon   right hip replacement  07/2010   went back in sept 2012 to fix   SHOULDER ARTHROSCOPY  2008   left   SPINE SURGERY N/A    Phreesia 03/18/2020   TOTAL HIP REVISION Right 12/17/2012   Procedure: RIGHT TOTAL HIP REVISION;  Surgeon: [name], MD;  Location: [facility];  Service: Orthopedics;  Laterality: Right;   WRIST SURGERY Right 2011   open reduction right wrist.    Social History: Social History   Socioeconomic History   Marital status: Married    Spouse name: louis   Number of children: 4   Years of education: 12+   Highest education level: Some college, no degree  Occupational History   Occupation: Audiological scientist  - retired   Occupation: non profit  Tobacco Use   Smoking status: Former    Current packs/day: 0.00    Average packs/day: 0.3 packs/day for 25.0 years (6.3 ttl pk-yrs)    Types: Cigarettes    Start date: 02/06/1978    Quit date: 02/07/2003    Years since quitting: 19.5   Smokeless tobacco: Never   Tobacco comments:    quit in 2004  Vaping Use   Vaping status: Never Used  Substance and Sexual Activity   Alcohol use: No   Drug use: No   Sexual activity: Not Currently    Birth control/protection: Surgical  Other Topics Concern   Not on file  Social History Narrative   Not on file   Social Determinants of Health   Financial Resource Strain: Low Risk  (12/28/2020)   Overall Financial Resource Strain (CARDIA)    Difficulty of Paying Living Expenses: Not hard at all  Food Insecurity: No Food Insecurity (08/01/2022)   Hunger Vital  Sign    Worried About Running Out of Food in the Last Year: Never true    Ran Out of Food in the Last Year: Never true  Transportation Needs: No Transportation Needs (08/01/2022)   PRAPARE - Administrator, Civil Service (Medical): No    Lack of Transportation (Non-Medical): No  Physical Activity: Inactive (04/23/2021)   Exercise Vital Sign    Days of Exercise per Week: 0 days    Minutes of Exercise per Session: 0 min  Stress: Stress Concern Present (04/23/2021)   Renee Waters [employer] Occupational Health - Occupational Stress Questionnaire    Feeling of Stress : Rather much  Social Connections: Moderately Integrated (12/28/2020)   Social Connection and Isolation Panel [NHANES]    Frequency of Communication with Friends and Family: More than three times a week    Frequency of Social Gatherings with Friends and Family: Once a week    Attends Religious Services: More than 4 times per year    Active Member of [employer] or Organizations: No    Attends Banker Meetings: Never    Marital Status: Married  Catering manager Violence: Not At Risk (07/26/2022)   Humiliation, Afraid, Rape, and Kick questionnaire    Fear of Current or Ex-Partner: No    Emotionally Abused: No    Physically Abused: No    Sexually Abused: No    Family History: Family History  Problem Relation Age of Onset   Hypertension Mother    Stroke Mother    Colon cancer Neg Hx    Anesthesia problems Neg Hx    Hypotension Neg Hx    Malignant hyperthermia Neg Hx  Pseudochol deficiency Neg Hx    Gastric cancer Neg Hx    Esophageal cancer Neg Hx     Current Medications:  Current Outpatient Medications:    acyclovir (ZOVIRAX) 800 MG tablet, Take 1 tablet (800 mg total) by mouth 5 (five) times daily as needed., Disp: 50 tablet, Rfl: 1   Allopurinol 200 MG TABS, Take 200 mg by mouth daily., Disp: 30 tablet, Rfl: 3   amLODipine (NORVASC) 10 MG tablet, Take 10 mg by mouth every morning., Disp: , Rfl:     cetirizine (ZYRTEC) 10 MG tablet, Take 1 tablet by mouth once daily, Disp: 30 tablet, Rfl: 0   cholecalciferol (CHOLECALCIFEROL) 25 MCG tablet, Take 1 tablet (1,000 Units total) by mouth daily., Disp: , Rfl:    EPINEPHrine 0.3 mg/0.3 mL IJ SOAJ injection, INJECT 0.3 MLS INTO MUSCLE ONCE AS NEEDED, Disp: 1 each, Rfl: 2   furosemide (LASIX) 20 MG tablet, TAKE 1 TABLET BY MOUTH ONCE DAILY AS NEEDED, Disp: 30 tablet, Rfl: 3   hydrOXYzine (ATARAX) 50 MG tablet, Take 1 tablet (50 mg total) by mouth 3 (three) times daily as needed., Disp: 90 tablet, Rfl: 1   lidocaine-prilocaine (EMLA) cream, Apply 1 Application topically as needed (Access to port and pain)., Disp: 30 g, Rfl: 6   metoprolol tartrate (LOPRESSOR) 25 MG tablet, Take 1.5 tablets (37.5 mg total) by mouth 2 (two) times daily., Disp: 180 tablet, Rfl: 3   mirtazapine (REMERON) 15 MG tablet, Take 1 tablet (15 mg total) by mouth at bedtime., Disp: 30 tablet, Rfl: 5   momelotinib dihydrochloride (OJJAARA) 100 MG tablet, Take 1 tablet (100 mg total) by mouth daily., Disp: 30 tablet, Rfl: 3   Multiple Vitamin (MULTIVITAMIN WITH MINERALS) TABS tablet, Take 1 tablet by mouth daily., Disp: , Rfl:    ondansetron (ZOFRAN-ODT) 8 MG disintegrating tablet, Take 1 tablet (8 mg total) by mouth every 8 (eight) hours as needed for nausea or vomiting., Disp: 20 tablet, Rfl: 0   oxyCODONE (ROXICODONE) 15 MG immediate release tablet, Take 15 mg by mouth every 6 (six) hours as needed., Disp: , Rfl:    pantoprazole (PROTONIX) 40 MG tablet, Take 1 tablet (40 mg total) by mouth 2 (two) times daily before a meal. **SEND REFILL REQUESTS TO PCP**, Disp: 60 tablet, Rfl: 0   potassium chloride (KLOR-CON) 10 MEQ tablet, Take 1 tablet by mouth once daily, Disp: 30 tablet, Rfl: 4   pregabalin (LYRICA) 75 MG capsule, Take 1 capsule (75 mg total) by mouth 2 (two) times daily., Disp: 60 capsule, Rfl: 0   prochlorperazine (COMPAZINE) 10 MG tablet, Take 1 tablet (10 mg total) by mouth  every 6 (six) hours as needed for nausea or vomiting., Disp: 60 tablet, Rfl: 1   temazepam (RESTORIL) 30 MG capsule, Take 1 capsule (30 mg total) by mouth at bedtime as needed for sleep., Disp: 30 capsule, Rfl: 5   tiZANidine (ZANAFLEX) 4 MG tablet, Take 1 tablet (4 mg total) by mouth every 8 (eight) hours as needed for muscle spasms., Disp: 30 tablet, Rfl: 0   trimethoprim (TRIMPEX) 100 MG tablet, Take 1 tablet (100 mg total) by mouth daily., Disp: 30 tablet, Rfl: 11 No current facility-administered medications for this visit.  Facility-Administered Medications Ordered in Other Visits:    lanreotide acetate (SOMATULINE DEPOT) 120 MG/0.5ML injection, , , ,    lanreotide acetate (SOMATULINE DEPOT) 120 MG/0.5ML injection, , , ,    lanreotide acetate (SOMATULINE DEPOT) 120 MG/0.5ML injection, , , ,  octreotide (SANDOSTATIN LAR) 30 MG IM injection, , , ,    octreotide (SANDOSTATIN LAR) 30 MG IM injection, , , ,    Allergies: Allergies  Allergen Reactions   Bee Venom Swelling and Hives   Norvasc [Amlodipine] Other (See Comments)    Hair loss   Tylenol [Acetaminophen] Itching   Hydralazine Other (See Comments)    Hair loss   Red Dye Itching   Percocet [Oxycodone-Acetaminophen] Rash and Other (See Comments)    Pt states, "this gives her a rash, but at home she takes oxycodone for pain relief"    Tyloxapol Itching and Rash    REVIEW OF SYSTEMS:   Review of Systems  Constitutional:  Negative for chills, fatigue and fever.  HENT:   Negative for lump/mass, mouth sores, nosebleeds, sore throat and trouble swallowing.   Eyes:  Negative for eye problems.  Respiratory:  Negative for cough and shortness of breath.   Cardiovascular:  Negative for chest pain, leg swelling and palpitations.  Gastrointestinal:  Negative for abdominal pain, constipation, diarrhea, nausea and vomiting.  Genitourinary:  Negative for bladder incontinence, difficulty urinating, dysuria, frequency, hematuria and  nocturia.   Musculoskeletal:  Positive for myalgias. Negative for arthralgias, back pain, flank pain and neck pain.  Skin:  Negative for itching and rash.  Neurological:  Positive for dizziness. Negative for headaches and numbness.  Hematological:  Does not bruise/bleed easily.  Psychiatric/Behavioral:  Negative for depression, sleep disturbance and suicidal ideas. The patient is not nervous/anxious.   All other systems reviewed and are negative.    VITALS:   There were no vitals taken for this visit.  Wt Readings from Last 3 Encounters:  08/24/22 113 lb 12.8 oz (51.6 kg)  08/09/22 115 lb 11.2 oz (52.5 kg)  07/26/22 116 lb (52.6 kg)    There is no height or weight on file to calculate BMI.  Performance status (ECOG): 1 - Symptomatic but completely ambulatory  PHYSICAL EXAM:   Physical Exam Vitals and nursing note reviewed. Exam conducted with a chaperone present.  Constitutional:      Appearance: Normal appearance.  Cardiovascular:     Rate and Rhythm: Normal rate and regular rhythm.     Pulses: Normal pulses.     Heart sounds: Normal heart sounds.  Pulmonary:     Effort: Pulmonary effort is normal.     Breath sounds: Normal breath sounds.  Abdominal:     Palpations: Abdomen is soft. There is no hepatomegaly, splenomegaly or mass.     Tenderness: There is no abdominal tenderness.  Musculoskeletal:     Right lower leg: No edema.     Left lower leg: No edema.  Lymphadenopathy:     Cervical: No cervical adenopathy.     Right cervical: No superficial, deep or posterior cervical adenopathy.    Left cervical: No superficial, deep or posterior cervical adenopathy.     Upper Body:     Right upper body: No supraclavicular or axillary adenopathy.     Left upper body: No supraclavicular or axillary adenopathy.  Neurological:     General: No focal deficit present.     Mental Status: She is alert and oriented to person, place, and time.  Psychiatric:        Mood and Affect: Mood  normal.        Behavior: Behavior normal.     LABS:      Latest Ref Rng & Units 09/01/2022    1:04 PM 08/08/2022  1:28 PM 07/27/2022    5:28 AM  CBC  WBC 4.0 - 10.5 K/uL 40.9  44.9  54.3   Hemoglobin 12.0 - 15.0 g/dL 8.5  7.2  7.7   Hematocrit 36.0 - 46.0 % 27.1  23.0  24.6   Platelets 150 - 400 K/uL 240  119  124       Latest Ref Rng & Units 09/01/2022    1:04 PM 08/08/2022    1:28 PM 07/29/2022    4:32 AM  CMP  Glucose 70 - 99 mg/dL 841  324  401   BUN 8 - 23 mg/dL 34  37  20   Creatinine 0.44 - 1.00 mg/dL 0.27  2.53  6.64   Sodium 135 - 145 mmol/L 137  137  137   Potassium 3.5 - 5.1 mmol/L 3.9  3.8  4.0   Chloride 98 - 111 mmol/L 108  109  110   CO2 22 - 32 mmol/L 23  20  20    Calcium 8.9 - 10.3 mg/dL 8.4  8.2  8.7   Total Protein 6.5 - 8.1 g/dL 7.8  7.3    Total Bilirubin 0.3 - 1.2 mg/dL 0.8  0.8    Alkaline Phos 38 - 126 U/L 130  92    AST 15 - 41 U/L 30  25    ALT 0 - 44 U/L 18  19       No results found for: "CEA1", "CEA" / No results found for: "CEA1", "CEA" No results found for: "PSA1" No results found for: "QIH474" No results found for: "CAN125"  No results found for: "TOTALPROTELP", "ALBUMINELP", "A1GS", "A2GS", "BETS", "BETA2SER", "GAMS", "MSPIKE", "SPEI" Lab Results  Component Value Date   TIBC 329 08/25/2015   TIBC 336 02/21/2011   FERRITIN 26 08/25/2015   FERRITIN 81 02/21/2011   IRONPCTSAT 8 (L) 08/25/2015   IRONPCTSAT 23 02/21/2011   Lab Results  Component Value Date   LDH 588 (H) 07/19/2022   LDH 620 (H) 06/07/2022   LDH 832 (H) 04/21/2022     STUDIES:   No results found.

## 2022-09-02 ENCOUNTER — Inpatient Hospital Stay: Payer: Medicare Other

## 2022-09-05 DIAGNOSIS — N179 Acute kidney failure, unspecified: Secondary | ICD-10-CM | POA: Diagnosis not present

## 2022-09-05 DIAGNOSIS — I129 Hypertensive chronic kidney disease with stage 1 through stage 4 chronic kidney disease, or unspecified chronic kidney disease: Secondary | ICD-10-CM | POA: Diagnosis not present

## 2022-09-05 DIAGNOSIS — D631 Anemia in chronic kidney disease: Secondary | ICD-10-CM | POA: Diagnosis not present

## 2022-09-05 DIAGNOSIS — D630 Anemia in neoplastic disease: Secondary | ICD-10-CM | POA: Diagnosis not present

## 2022-09-05 DIAGNOSIS — C946 Myelodysplastic disease, not classified: Secondary | ICD-10-CM | POA: Diagnosis not present

## 2022-09-05 DIAGNOSIS — N184 Chronic kidney disease, stage 4 (severe): Secondary | ICD-10-CM | POA: Diagnosis not present

## 2022-09-07 ENCOUNTER — Ambulatory Visit: Payer: Medicare Other | Attending: Internal Medicine

## 2022-09-07 ENCOUNTER — Encounter: Payer: Self-pay | Admitting: Internal Medicine

## 2022-09-07 ENCOUNTER — Other Ambulatory Visit: Payer: Self-pay | Admitting: Internal Medicine

## 2022-09-07 ENCOUNTER — Ambulatory Visit: Payer: Medicare Other | Attending: Internal Medicine | Admitting: Internal Medicine

## 2022-09-07 VITALS — BP 90/50 | HR 48 | Ht 65.0 in | Wt 114.6 lb

## 2022-09-07 DIAGNOSIS — I5032 Chronic diastolic (congestive) heart failure: Secondary | ICD-10-CM | POA: Diagnosis not present

## 2022-09-07 DIAGNOSIS — R42 Dizziness and giddiness: Secondary | ICD-10-CM

## 2022-09-07 DIAGNOSIS — I351 Nonrheumatic aortic (valve) insufficiency: Secondary | ICD-10-CM | POA: Insufficient documentation

## 2022-09-07 DIAGNOSIS — I340 Nonrheumatic mitral (valve) insufficiency: Secondary | ICD-10-CM | POA: Insufficient documentation

## 2022-09-07 MED ORDER — METOPROLOL TARTRATE 25 MG PO TABS: 12.5000 mg | ORAL_TABLET | Freq: Two times a day (BID) | ORAL | Status: DC

## 2022-09-07 NOTE — Patient Instructions (Addendum)
Medication Instructions:  Your physician has recommended you make the following change in your medication:  Decrease Metoprolol Tartrate from 37.5 mg to 12.5 mg twice a day Continue taking all other medications as prescribed  Labwork: None  Testing/Procedures: Your physician has recommended that you wear a Zio monitor.   This monitor is a medical device that records the heart's electrical activity. Doctors most often use these monitors to diagnose arrhythmias. Arrhythmias are problems with the speed or rhythm of the heartbeat. The monitor is a small device applied to your chest. You can wear one while you do your normal daily activities. While wearing this monitor if you have any symptoms to push the button and record what you felt. Once you have worn this monitor for the period of time provider prescribed (for 7 days), you will return the monitor device in the postage paid box. Once it is returned they will download the data collected and provide [place] with a report which the provider will then review and we will call you with those results. Important tips:  Avoid showering during the first 48 hours of wearing the monitor. Avoid excessive sweating to help maximize wear time. Do not submerge the device, no hot tubs, and no swimming pools. Keep any lotions or oils away from the patch. After 48 hours you may shower with the patch on. Take brief showers with your back facing the shower head.  Do not remove patch once it has been placed because that will interrupt data and decrease adhesive wear time. Push the button when you have any symptoms and write down what you were feeling. Once you have completed wearing your monitor, remove and place into box which has postage paid and place in your outgoing mailbox.  If for some reason you have misplaced your box then call our office and we can provide another box and/or mail it off for you.   Follow-Up: Your physician recommends that you schedule a  follow-up appointment in: 3 months  Any Other Special Instructions Will Be Listed Below (If Applicable).  Drink 60 ounces of fluids daily  If you need a refill on your cardiac medications before your next appointment, please call your pharmacy.

## 2022-09-08 ENCOUNTER — Encounter: Payer: Self-pay | Admitting: Urology

## 2022-09-08 ENCOUNTER — Other Ambulatory Visit: Payer: Self-pay

## 2022-09-11 DIAGNOSIS — M519 Unspecified thoracic, thoracolumbar and lumbosacral intervertebral disc disorder: Secondary | ICD-10-CM | POA: Diagnosis not present

## 2022-09-11 DIAGNOSIS — Z9181 History of falling: Secondary | ICD-10-CM | POA: Diagnosis not present

## 2022-09-11 DIAGNOSIS — C946 Myelodysplastic disease, not classified: Secondary | ICD-10-CM | POA: Diagnosis not present

## 2022-09-11 DIAGNOSIS — G4700 Insomnia, unspecified: Secondary | ICD-10-CM | POA: Diagnosis not present

## 2022-09-11 DIAGNOSIS — Z87440 Personal history of urinary (tract) infections: Secondary | ICD-10-CM | POA: Diagnosis not present

## 2022-09-11 DIAGNOSIS — M329 Systemic lupus erythematosus, unspecified: Secondary | ICD-10-CM | POA: Diagnosis not present

## 2022-09-11 DIAGNOSIS — Z556 Problems related to health literacy: Secondary | ICD-10-CM | POA: Diagnosis not present

## 2022-09-11 DIAGNOSIS — G8929 Other chronic pain: Secondary | ICD-10-CM | POA: Diagnosis not present

## 2022-09-11 DIAGNOSIS — D631 Anemia in chronic kidney disease: Secondary | ICD-10-CM | POA: Diagnosis not present

## 2022-09-11 DIAGNOSIS — E876 Hypokalemia: Secondary | ICD-10-CM | POA: Diagnosis not present

## 2022-09-11 DIAGNOSIS — F32A Depression, unspecified: Secondary | ICD-10-CM | POA: Diagnosis not present

## 2022-09-11 DIAGNOSIS — N179 Acute kidney failure, unspecified: Secondary | ICD-10-CM | POA: Diagnosis not present

## 2022-09-11 DIAGNOSIS — F419 Anxiety disorder, unspecified: Secondary | ICD-10-CM | POA: Diagnosis not present

## 2022-09-11 DIAGNOSIS — N184 Chronic kidney disease, stage 4 (severe): Secondary | ICD-10-CM | POA: Diagnosis not present

## 2022-09-11 DIAGNOSIS — I129 Hypertensive chronic kidney disease with stage 1 through stage 4 chronic kidney disease, or unspecified chronic kidney disease: Secondary | ICD-10-CM | POA: Diagnosis not present

## 2022-09-11 DIAGNOSIS — Z87891 Personal history of nicotine dependence: Secondary | ICD-10-CM | POA: Diagnosis not present

## 2022-09-11 DIAGNOSIS — M1990 Unspecified osteoarthritis, unspecified site: Secondary | ICD-10-CM | POA: Diagnosis not present

## 2022-09-11 DIAGNOSIS — M542 Cervicalgia: Secondary | ICD-10-CM | POA: Diagnosis not present

## 2022-09-11 DIAGNOSIS — D630 Anemia in neoplastic disease: Secondary | ICD-10-CM | POA: Diagnosis not present

## 2022-09-11 DIAGNOSIS — K2270 Barrett's esophagus without dysplasia: Secondary | ICD-10-CM | POA: Diagnosis not present

## 2022-09-12 ENCOUNTER — Other Ambulatory Visit: Payer: Self-pay | Admitting: Hematology

## 2022-09-12 ENCOUNTER — Telehealth: Payer: Self-pay | Admitting: Family Medicine

## 2022-09-12 DIAGNOSIS — D631 Anemia in chronic kidney disease: Secondary | ICD-10-CM | POA: Diagnosis not present

## 2022-09-12 DIAGNOSIS — D630 Anemia in neoplastic disease: Secondary | ICD-10-CM | POA: Diagnosis not present

## 2022-09-12 DIAGNOSIS — C946 Myelodysplastic disease, not classified: Secondary | ICD-10-CM | POA: Diagnosis not present

## 2022-09-12 DIAGNOSIS — N184 Chronic kidney disease, stage 4 (severe): Secondary | ICD-10-CM | POA: Diagnosis not present

## 2022-09-12 DIAGNOSIS — N179 Acute kidney failure, unspecified: Secondary | ICD-10-CM | POA: Diagnosis not present

## 2022-09-12 DIAGNOSIS — I129 Hypertensive chronic kidney disease with stage 1 through stage 4 chronic kidney disease, or unspecified chronic kidney disease: Secondary | ICD-10-CM | POA: Diagnosis not present

## 2022-09-12 NOTE — Telephone Encounter (Signed)
Drinda Butts called from [facility] seeing patient for physical therapy  and reporting fall last night shoulder and hip hurting, vision seeing triple, noticed confusion with word finding, feeling fatigue vitals normal except for heart rate is at 49. Drinda Butts asked for a call back # [phone]

## 2022-09-13 ENCOUNTER — Other Ambulatory Visit (HOSPITAL_COMMUNITY): Payer: Self-pay

## 2022-09-13 ENCOUNTER — Ambulatory Visit (INDEPENDENT_AMBULATORY_CARE_PROVIDER_SITE_OTHER): Payer: Medicare Other | Admitting: Urology

## 2022-09-13 VITALS — BP 90/57 | HR 55

## 2022-09-13 DIAGNOSIS — R339 Retention of urine, unspecified: Secondary | ICD-10-CM

## 2022-09-13 DIAGNOSIS — Z87440 Personal history of urinary (tract) infections: Secondary | ICD-10-CM

## 2022-09-13 DIAGNOSIS — N390 Urinary tract infection, site not specified: Secondary | ICD-10-CM

## 2022-09-13 NOTE — Progress Notes (Signed)
History of Present Illness: Renee Waters is a 77 y.o. year old female here for follow-up of incomplete bladder emptying as well as recurrent urinary tract infection/postmenopausal atrophic vaginitis.  She does not tolerate perivaginal estrogen cream.  At her visit in April, she grew Enterococcus.  She was admitted to the hospital on 13 June.  Blood culture were positive but most likely skin contaminant.  She was hypertensive.  Urine culture was negative.  8.6.2024: She treated for a UTI about a month ago here.  She is on trimethoprim 100 mg daily.  Currently asymptomatic.  Past Medical History:  Diagnosis Date   Acute cholangitis    Acute respiratory failure with hypoxia (HCC) 09/01/2021   Allergy    Anemia    Anxiety    Arthritis    Phreesia 03/18/2020   Barrett's esophagus    Cataract    Chronic back pain    Chronic neck pain    CKD (chronic kidney disease) stage 3, GFR 30-59 ml/min (HCC) 10/29/2020   Depression    Genital herpes    GERD (gastroesophageal reflux disease)    H/O degenerative disc disease    History of blood transfusion    Hypertension    Hypoxia 12/01/2021   Insomnia    Lupus (systemic lupus erythematosus) (HCC)    Neuromuscular disorder (HCC)    Osteoarthritis    S/P colonoscopy 07/2003   normal, no polyps   S/P endoscopy June 2005, Oct 2009   2005: short-segment Barrett's, 2009: short-segment Barrett's   Upper respiratory tract infection 07/09/2020   UTI (lower urinary tract infection) 11/2012    Past Surgical History:  Procedure Laterality Date   ABDOMINAL HYSTERECTOMY     BACK SURGERY     BIOPSY N/A 03/20/2014   Procedure: BIOPSY;  Surgeon: Corbin Ade, MD;  Location: [facility];  Service: Endoscopy;  Laterality: N/A;   BIOPSY  09/14/2015   Procedure: BIOPSY;  Surgeon: Corbin Ade, MD;  [facility];  Service: Endoscopy;;  esophageal and gastric   BIOPSY  07/23/2019   Procedure: BIOPSY;  Surgeon: Corbin Ade, MD;  [facility];  Service: Endoscopy;;  esophageal    CARPAL TUNNEL RELEASE Left 2013   cervical disectomy  2002   CESAREAN SECTION N/A    Phreesia 03/18/2020   CHOLECYSTECTOMY     with lysis of adhesions for sbo; "ruptured gallbladder".   COLONOSCOPY  11/09/2011   RMR: Melanosis coli   COLONOSCOPY WITH PROPOFOL N/A 09/14/2015   Dr. Jena Gauss: diverticulosis, 3mm TA removed. next TCS 09/2020.    COLONOSCOPY WITH PROPOFOL N/A 04/30/2020   Procedure: COLONOSCOPY WITH PROPOFOL;  Surgeon: Corbin Ade, MD;  [facility];  Service: Endoscopy;  Laterality: N/A;  PM (ASA 3)   DENTAL SURGERY  11/2015   multiple tooth extraction   ESOPHAGOGASTRODUODENOSCOPY  11/29/2007   salmon-colored  tongue   longest stable at  3 cm, distal esophagus as described previously status post biopsy/ Hiatal hernia, otherwise normal stomach D1 and D2   ESOPHAGOGASTRODUODENOSCOPY  01/06/11   short segment Barrett's esophagus s/p bx/Hiatal hernia   ESOPHAGOGASTRODUODENOSCOPY (EGD) WITH PROPOFOL N/A 03/20/2014   [id number] distal esophagus short segment barrett's, bx with no dysplasia. next egd in 03/2017   ESOPHAGOGASTRODUODENOSCOPY (EGD) WITH PROPOFOL N/A 09/14/2015   Dr. Jena Gauss: Barrett's without dysplasia, gastritis benign bx, hiatal hernia. next EGD 09/2018.   ESOPHAGOGASTRODUODENOSCOPY (EGD) WITH PROPOFOL N/A 07/23/2019   Procedure: ESOPHAGOGASTRODUODENOSCOPY (EGD) WITH PROPOFOL;  Surgeon: Jena Gauss,  Gerrit Friends, MD;  [facility];  Service: Endoscopy;  Laterality: N/A;  3:00pm   EYE SURGERY N/A    Phreesia 03/18/2020   HERNIA REPAIR Right 07/2010   Dr. Arlean Hopping   IR IMAGING GUIDED PORT INSERTION  02/09/2021   JOINT REPLACEMENT     LAPAROSCOPIC CHOLECYSTECTOMY  2017   at [facility]   POLYPECTOMY  09/14/2015   Procedure: POLYPECTOMY;  Surgeon: Corbin Ade, MD;  [facility];  Service: Endoscopy;;  ascending colon   right hip replacement  07/2010   went back in sept 2012 to fix   SHOULDER ARTHROSCOPY  2008    left   SPINE SURGERY N/A    Phreesia 03/18/2020   TOTAL HIP REVISION Right 12/17/2012   Procedure: RIGHT TOTAL HIP REVISION;  Surgeon: [name], MD;  Location: [facility];  Service: Orthopedics;  Laterality: Right;   WRIST SURGERY Right 2011   open reduction right wrist.    Home Medications:  (Not in a hospital admission)   Allergies:  Allergies  Allergen Reactions   Bee Venom Swelling and Hives   Norvasc [Amlodipine] Other (See Comments)    Hair loss   Tylenol [Acetaminophen] Itching   Hydralazine Other (See Comments)    Hair loss   Red Dye Itching   Percocet [Oxycodone-Acetaminophen] Rash and Other (See Comments)    Pt states, "this gives her a rash, but at home she takes oxycodone for pain relief"    Tyloxapol Itching and Rash    Family History  Problem Relation Age of Onset   Hypertension Mother    Stroke Mother    Colon cancer Neg Hx    Anesthesia problems Neg Hx    Hypotension Neg Hx    Malignant hyperthermia Neg Hx    Pseudochol deficiency Neg Hx    Gastric cancer Neg Hx    Esophageal cancer Neg Hx     Social History:  reports that she quit smoking about 19 years ago. Her smoking use included cigarettes. She started smoking about 44 years ago. She has a 6.3 pack-year smoking history. She has never used smokeless tobacco. She reports that she does not drink alcohol and does not use drugs.  ROS: A complete review of systems was performed.  All systems are negative except for pertinent findings as noted.  Physical Exam:  Vital signs in last 24 hours: @VSRANGES @ General:  Alert and oriented, No acute distress HEENT: Normocephalic, atraumatic Neck: No JVD or lymphadenopathy Cardiovascular: Regular rate  Lungs: Normal inspiratory/expiratory excursion Extremities: No edema Neurologic: Grossly intact  I have reviewed prior pt notes  I have reviewed urinalysis results  I have independently reviewed bladder scan-450 mL  I have reviewed prior urine  culture   Impression/Assessment:  Incomplete bladder emptying-still not doing double voiding  Recurrent UTIs, currently asymptomatic  Plan:  She will continue the trimethoprim  Double void 2-3 times a day  I will see her back in about 3 months for recheck.  Bertram Millard  09/13/2022, 12:35 PM  Bertram Millard.  MD

## 2022-09-13 NOTE — Telephone Encounter (Signed)
Patient and daughter have concerns that medication may be causing dizziness, I advise her to stop restoril  and only take remeron, also try t stop muscle relaxant as this can a;so make her dizzy/ light headed

## 2022-09-13 NOTE — Telephone Encounter (Signed)
[id number] 

## 2022-09-14 ENCOUNTER — Other Ambulatory Visit (HOSPITAL_COMMUNITY): Payer: Self-pay

## 2022-09-14 DIAGNOSIS — G8929 Other chronic pain: Secondary | ICD-10-CM | POA: Diagnosis not present

## 2022-09-14 DIAGNOSIS — D631 Anemia in chronic kidney disease: Secondary | ICD-10-CM | POA: Diagnosis not present

## 2022-09-14 DIAGNOSIS — N179 Acute kidney failure, unspecified: Secondary | ICD-10-CM | POA: Diagnosis not present

## 2022-09-14 DIAGNOSIS — M549 Dorsalgia, unspecified: Secondary | ICD-10-CM | POA: Diagnosis not present

## 2022-09-14 DIAGNOSIS — C946 Myelodysplastic disease, not classified: Secondary | ICD-10-CM | POA: Diagnosis not present

## 2022-09-14 DIAGNOSIS — M542 Cervicalgia: Secondary | ICD-10-CM | POA: Diagnosis not present

## 2022-09-14 DIAGNOSIS — N183 Chronic kidney disease, stage 3 unspecified: Secondary | ICD-10-CM | POA: Diagnosis not present

## 2022-09-14 DIAGNOSIS — N390 Urinary tract infection, site not specified: Secondary | ICD-10-CM | POA: Diagnosis not present

## 2022-09-14 DIAGNOSIS — G709 Myoneural disorder, unspecified: Secondary | ICD-10-CM | POA: Diagnosis not present

## 2022-09-14 DIAGNOSIS — I129 Hypertensive chronic kidney disease with stage 1 through stage 4 chronic kidney disease, or unspecified chronic kidney disease: Secondary | ICD-10-CM | POA: Diagnosis not present

## 2022-09-14 DIAGNOSIS — M329 Systemic lupus erythematosus, unspecified: Secondary | ICD-10-CM | POA: Diagnosis not present

## 2022-09-14 DIAGNOSIS — E860 Dehydration: Secondary | ICD-10-CM | POA: Diagnosis not present

## 2022-09-16 ENCOUNTER — Other Ambulatory Visit (HOSPITAL_COMMUNITY): Payer: Self-pay

## 2022-09-16 ENCOUNTER — Other Ambulatory Visit: Payer: Self-pay

## 2022-09-19 ENCOUNTER — Other Ambulatory Visit (HOSPITAL_COMMUNITY): Payer: Self-pay

## 2022-09-21 DIAGNOSIS — N179 Acute kidney failure, unspecified: Secondary | ICD-10-CM | POA: Diagnosis not present

## 2022-09-21 DIAGNOSIS — D630 Anemia in neoplastic disease: Secondary | ICD-10-CM | POA: Diagnosis not present

## 2022-09-21 DIAGNOSIS — I129 Hypertensive chronic kidney disease with stage 1 through stage 4 chronic kidney disease, or unspecified chronic kidney disease: Secondary | ICD-10-CM | POA: Diagnosis not present

## 2022-09-21 DIAGNOSIS — I340 Nonrheumatic mitral (valve) insufficiency: Secondary | ICD-10-CM | POA: Insufficient documentation

## 2022-09-21 DIAGNOSIS — N184 Chronic kidney disease, stage 4 (severe): Secondary | ICD-10-CM | POA: Diagnosis not present

## 2022-09-21 DIAGNOSIS — C946 Myelodysplastic disease, not classified: Secondary | ICD-10-CM | POA: Diagnosis not present

## 2022-09-21 DIAGNOSIS — I351 Nonrheumatic aortic (valve) insufficiency: Secondary | ICD-10-CM | POA: Insufficient documentation

## 2022-09-21 DIAGNOSIS — D631 Anemia in chronic kidney disease: Secondary | ICD-10-CM | POA: Diagnosis not present

## 2022-09-21 DIAGNOSIS — I5032 Chronic diastolic (congestive) heart failure: Secondary | ICD-10-CM | POA: Insufficient documentation

## 2022-09-21 NOTE — Progress Notes (Signed)
Cardiology Office Note  Date: 09/21/2022   ID: Renee Waters, DOB 1945/06/17, MRN [id number]  PCP:  Renee Perches, MD  Cardiologist:  Renee Bicker, MD Electrophysiologist:  None   History of Present Illness: Renee Waters is a 77 y.o. female known to have labile HTN, mild to moderate AI, chronic diastolic heart failure is here for follow-up visit.  No angina, DOE, orthopnea, PND or leg swelling.  Has ongoing dizziness with no presyncope or syncope.  Compliant with medications and has no side effects.  Past Medical History:  Diagnosis Date   Acute cholangitis    Acute respiratory failure with hypoxia (HCC) 09/01/2021   Allergy    Anemia    Anxiety    Arthritis    Phreesia 03/18/2020   Barrett's esophagus    Cataract    Chronic back pain    Chronic neck pain    CKD (chronic kidney disease) stage 3, GFR 30-59 ml/min (HCC) 10/29/2020   Depression    Genital herpes    GERD (gastroesophageal reflux disease)    H/O degenerative disc disease    History of blood transfusion    Hypertension    Hypoxia 12/01/2021   Insomnia    Lupus (systemic lupus erythematosus) (HCC)    Neuromuscular disorder (HCC)    Osteoarthritis    S/P colonoscopy 07/2003   normal, no polyps   S/P endoscopy June 2005, Oct 2009   2005: short-segment Barrett's, 2009: short-segment Barrett's   Upper respiratory tract infection 07/09/2020   UTI (lower urinary tract infection) 11/2012    Past Surgical History:  Procedure Laterality Date   ABDOMINAL HYSTERECTOMY     BACK SURGERY     BIOPSY N/A 03/20/2014   Procedure: BIOPSY;  Surgeon: Corbin Ade, MD;  Location: [facility];  Service: Endoscopy;  Laterality: N/A;   BIOPSY  09/14/2015   Procedure: BIOPSY;  Surgeon: Corbin Ade, MD;  [facility];  Service: Endoscopy;;  esophageal and gastric   BIOPSY  07/23/2019   Procedure: BIOPSY;  Surgeon: Corbin Ade, MD;  [facility];  Service: Endoscopy;;  esophageal     CARPAL TUNNEL RELEASE Left 2013   cervical disectomy  2002   CESAREAN SECTION N/A    Phreesia 03/18/2020   CHOLECYSTECTOMY     with lysis of adhesions for sbo; "ruptured gallbladder".   COLONOSCOPY  11/09/2011   RMR: Melanosis coli   COLONOSCOPY WITH PROPOFOL N/A 09/14/2015   Dr. Jena Gauss: diverticulosis, 3mm TA removed. next TCS 09/2020.    COLONOSCOPY WITH PROPOFOL N/A 04/30/2020   Procedure: COLONOSCOPY WITH PROPOFOL;  Surgeon: Corbin Ade, MD;  [facility];  Service: Endoscopy;  Laterality: N/A;  PM (ASA 3)   DENTAL SURGERY  11/2015   multiple tooth extraction   ESOPHAGOGASTRODUODENOSCOPY  11/29/2007   salmon-colored  tongue   longest stable at  3 cm, distal esophagus as described previously status post biopsy/ Hiatal hernia, otherwise normal stomach D1 and D2   ESOPHAGOGASTRODUODENOSCOPY  01/06/11   short segment Barrett's esophagus s/p bx/Hiatal hernia   ESOPHAGOGASTRODUODENOSCOPY (EGD) WITH PROPOFOL N/A 03/20/2014   [id number]:[id number] distal esophagus short segment barrett's, bx with no dysplasia. next egd in 03/2017   ESOPHAGOGASTRODUODENOSCOPY (EGD) WITH PROPOFOL N/A 09/14/2015   Dr. Jena Gauss: Barrett's without dysplasia, gastritis benign bx, hiatal hernia. next EGD 09/2018.   ESOPHAGOGASTRODUODENOSCOPY (EGD) WITH PROPOFOL N/A 07/23/2019   Procedure: ESOPHAGOGASTRODUODENOSCOPY (EGD) WITH PROPOFOL;  Surgeon: Corbin Ade, MD;  [facility];  Service: Endoscopy;  Laterality: N/A;  3:00pm   EYE SURGERY N/A    Phreesia 03/18/2020   HERNIA REPAIR Right 07/2010   Dr. Arlean Hopping   IR IMAGING GUIDED PORT INSERTION  02/09/2021   JOINT REPLACEMENT     LAPAROSCOPIC CHOLECYSTECTOMY  2017   at [facility]   POLYPECTOMY  09/14/2015   Procedure: POLYPECTOMY;  Surgeon: Corbin Ade, MD;  [facility];  Service: Endoscopy;;  ascending colon   right hip replacement  07/2010   went back in sept 2012 to fix   SHOULDER ARTHROSCOPY  2008   left   SPINE SURGERY N/A    Phreesia  03/18/2020   TOTAL HIP REVISION Right 12/17/2012   Procedure: RIGHT TOTAL HIP REVISION;  Surgeon: [name], MD;  Location: [facility];  Service: Orthopedics;  Laterality: Right;   WRIST SURGERY Right 2011   open reduction right wrist.    Current Outpatient Medications  Medication Sig Dispense Refill   acyclovir (ZOVIRAX) 800 MG tablet Take 1 tablet (800 mg total) by mouth 5 (five) times daily as needed. 50 tablet 1   Allopurinol 200 MG TABS Take 200 mg by mouth daily. 30 tablet 3   amLODipine (NORVASC) 10 MG tablet Take 10 mg by mouth every morning.     cetirizine (ZYRTEC) 10 MG tablet Take 1 tablet by mouth once daily 30 tablet 0   cholecalciferol (CHOLECALCIFEROL) 25 MCG tablet Take 1 tablet (1,000 Units total) by mouth daily.     EPINEPHrine 0.3 mg/0.3 mL IJ SOAJ injection INJECT 0.3 MLS INTO MUSCLE ONCE AS NEEDED 1 each 2   furosemide (LASIX) 20 MG tablet TAKE 1 TABLET BY MOUTH ONCE DAILY AS NEEDED 30 tablet 3   hydrOXYzine (ATARAX) 50 MG tablet Take 1 tablet (50 mg total) by mouth 3 (three) times daily as needed. 90 tablet 1   lidocaine-prilocaine (EMLA) cream Apply 1 Application topically as needed (Access to port and pain). 30 g 6   mirtazapine (REMERON) 15 MG tablet Take 1 tablet (15 mg total) by mouth at bedtime. 30 tablet 5   momelotinib dihydrochloride (OJJAARA) 100 MG tablet Take 1 tablet (100 mg total) by mouth daily. 30 tablet 3   Multiple Vitamin (MULTIVITAMIN WITH MINERALS) TABS tablet Take 1 tablet by mouth daily.     ondansetron (ZOFRAN-ODT) 8 MG disintegrating tablet Take 1 tablet (8 mg total) by mouth every 8 (eight) hours as needed for nausea or vomiting. 20 tablet 0   oxyCODONE (ROXICODONE) 15 MG immediate release tablet Take 15 mg by mouth every 6 (six) hours as needed.     pantoprazole (PROTONIX) 40 MG tablet Take 1 tablet (40 mg total) by mouth 2 (two) times daily before a meal. **SEND REFILL REQUESTS TO PCP** 60 tablet 0   potassium chloride (KLOR-CON) 10 MEQ  tablet Take 1 tablet by mouth once daily 30 tablet 4   prochlorperazine (COMPAZINE) 10 MG tablet Take 1 tablet (10 mg total) by mouth every 6 (six) hours as needed for nausea or vomiting. 60 tablet 1   tiZANidine (ZANAFLEX) 4 MG tablet Take 1 tablet (4 mg total) by mouth every 8 (eight) hours as needed for muscle spasms. 30 tablet 0   trimethoprim (TRIMPEX) 100 MG tablet Take 1 tablet (100 mg total) by mouth daily. 30 tablet 11   metoprolol tartrate (LOPRESSOR) 25 MG tablet Take 0.5 tablets (12.5 mg total) by mouth 2 (two) times daily.     pregabalin (LYRICA) 75  MG capsule Take 1 capsule by mouth twice daily 60 capsule 0   No current facility-administered medications for this visit.   Facility-Administered Medications Ordered in Other Visits  Medication Dose Route Frequency Provider Last Rate Last Admin   lanreotide acetate (SOMATULINE DEPOT) 120 MG/0.5ML injection            lanreotide acetate (SOMATULINE DEPOT) 120 MG/0.5ML injection            lanreotide acetate (SOMATULINE DEPOT) 120 MG/0.5ML injection            octreotide (SANDOSTATIN LAR) 30 MG IM injection            octreotide (SANDOSTATIN LAR) 30 MG IM injection            Allergies:  Bee venom, Norvasc [amlodipine], Tylenol [acetaminophen], Hydralazine, Red dye #40 (allura red), Percocet [oxycodone-acetaminophen], and Tyloxapol   Social History: The patient  reports that she quit smoking about 19 years ago. Her smoking use included cigarettes. She started smoking about 44 years ago. She has a 6.3 pack-year smoking history. She has never used smokeless tobacco. She reports that she does not drink alcohol and does not use drugs.   Family History: The patient's family history includes Hypertension in her mother; Stroke in her mother.   ROS:  Please see the history of present illness. Otherwise, complete review of systems is positive for none  All other systems are reviewed and negative.   Physical Exam: VS:  BP (!) 90/50   Pulse  (!) 48   Ht 5\' 5"  (1.651 m)   Wt 114 lb 9.6 oz (52 kg)   SpO2 92%   BMI 19.07 kg/m , BMI Body mass index is 19.07 kg/m.  Wt Readings from Last 3 Encounters:  09/07/22 114 lb 9.6 oz (52 kg)  08/24/22 113 lb 12.8 oz (51.6 kg)  08/09/22 115 lb 11.2 oz (52.5 kg)    General: Patient appears comfortable at rest. HEENT: Conjunctiva and lids normal, oropharynx clear with moist mucosa. Neck: Supple, no elevated JVP or carotid bruits, no thyromegaly. Lungs: Clear to auscultation, nonlabored breathing at rest. Cardiac: Regular rate and rhythm, no S3 or significant systolic murmur, no pericardial rub. Abdomen: Soft, nontender, no hepatomegaly, bowel sounds present, no guarding or rebound. Extremities: No pitting edema, distal pulses 2+. Skin: Warm and dry. Musculoskeletal: No kyphosis. Neuropsychiatric: Alert and oriented x3, affect grossly appropriate.  Recent Labwork: 11/10/2021: B Natriuretic Peptide 168.7 07/22/2022: TSH 5.428 09/01/2022: ALT 18; AST 30; BUN 34; Creatinine, Ser 1.99; Hemoglobin 8.5; Magnesium 1.9; Platelets 240; Potassium 3.9; Sodium 137     Component Value Date/Time   CHOL 95 (L) 10/05/2020 0959   TRIG 87 10/05/2020 0959   HDL 27 (L) 10/05/2020 0959   CHOLHDL 3.5 10/05/2020 0959   CHOLHDL 3.0 11/03/2014 1003   VLDL 12 11/03/2014 1003   LDLCALC 51 10/05/2020 0959      Assessment and Plan:   Orthostatic hypotension, symptomatic with a diagnosis of HTN: Labile BPs, symptomatic with dizziness. Decrease metoprolol tartrate to 12.5 mg twice daily.  Encouraged increased p.o. hydration.  Check BPs at home, a.m. and p.m.  Sinus bradycardia, symptomatic: Obtain 2-week event monitor to rule out conduction system issues.  Decrease metoprolol tartrate to 12.5 mg twice daily.  Valvular heart disease in 9/23 (mild to moderate AR, mild MR): Repeat 2D echocardiogram in 1 year.  Chronic diastolic heart failure: G2 DD on echo from 9/23.  Compensated on exam.      Medication  Adjustments/Labs and Tests Ordered: Current medicines are reviewed at length with the patient today.  Concerns regarding medicines are outlined above.    Disposition:  Follow up  3 months with orthostatic vitals  Signed  Verne Spurr, MD, 09/21/2022 9:43 AM    [facility] [address] [place], [place], [place] [address]

## 2022-09-23 DIAGNOSIS — D47Z9 Other specified neoplasms of uncertain behavior of lymphoid, hematopoietic and related tissue: Secondary | ICD-10-CM | POA: Diagnosis not present

## 2022-09-23 DIAGNOSIS — Z515 Encounter for palliative care: Secondary | ICD-10-CM | POA: Diagnosis not present

## 2022-09-23 DIAGNOSIS — R42 Dizziness and giddiness: Secondary | ICD-10-CM | POA: Diagnosis not present

## 2022-09-28 DIAGNOSIS — C946 Myelodysplastic disease, not classified: Secondary | ICD-10-CM | POA: Diagnosis not present

## 2022-09-28 DIAGNOSIS — N179 Acute kidney failure, unspecified: Secondary | ICD-10-CM | POA: Diagnosis not present

## 2022-09-28 DIAGNOSIS — I129 Hypertensive chronic kidney disease with stage 1 through stage 4 chronic kidney disease, or unspecified chronic kidney disease: Secondary | ICD-10-CM | POA: Diagnosis not present

## 2022-09-28 DIAGNOSIS — N184 Chronic kidney disease, stage 4 (severe): Secondary | ICD-10-CM | POA: Diagnosis not present

## 2022-09-28 DIAGNOSIS — D630 Anemia in neoplastic disease: Secondary | ICD-10-CM | POA: Diagnosis not present

## 2022-09-28 DIAGNOSIS — D631 Anemia in chronic kidney disease: Secondary | ICD-10-CM | POA: Diagnosis not present

## 2022-10-04 ENCOUNTER — Inpatient Hospital Stay: Payer: Medicare Other

## 2022-10-04 ENCOUNTER — Inpatient Hospital Stay: Payer: Medicare Other | Attending: Hematology | Admitting: Hematology

## 2022-10-04 VITALS — BP 158/78 | HR 66 | Temp 98.4°F | Resp 16 | Wt 115.1 lb

## 2022-10-04 DIAGNOSIS — L299 Pruritus, unspecified: Secondary | ICD-10-CM | POA: Diagnosis not present

## 2022-10-04 DIAGNOSIS — Z95828 Presence of other vascular implants and grafts: Secondary | ICD-10-CM

## 2022-10-04 DIAGNOSIS — D471 Chronic myeloproliferative disease: Secondary | ICD-10-CM

## 2022-10-04 DIAGNOSIS — R161 Splenomegaly, not elsewhere classified: Secondary | ICD-10-CM

## 2022-10-04 DIAGNOSIS — D649 Anemia, unspecified: Secondary | ICD-10-CM

## 2022-10-04 DIAGNOSIS — E876 Hypokalemia: Secondary | ICD-10-CM | POA: Insufficient documentation

## 2022-10-04 DIAGNOSIS — N189 Chronic kidney disease, unspecified: Secondary | ICD-10-CM | POA: Diagnosis not present

## 2022-10-04 LAB — COMPREHENSIVE METABOLIC PANEL
ALT: 21 U/L (ref 0–44)
AST: 26 U/L (ref 15–41)
Albumin: 3.8 g/dL (ref 3.5–5.0)
Alkaline Phosphatase: 124 U/L (ref 38–126)
Anion gap: 7 (ref 5–15)
BUN: 28 mg/dL — ABNORMAL HIGH (ref 8–23)
CO2: 22 mmol/L (ref 22–32)
Calcium: 8.7 mg/dL — ABNORMAL LOW (ref 8.9–10.3)
Chloride: 109 mmol/L (ref 98–111)
Creatinine, Ser: 1.54 mg/dL — ABNORMAL HIGH (ref 0.44–1.00)
GFR, Estimated: 35 mL/min — ABNORMAL LOW (ref >60)
Glucose, Bld: 106 mg/dL — ABNORMAL HIGH (ref 70–99)
Potassium: 4 mmol/L (ref 3.5–5.1)
Sodium: 138 mmol/L (ref 135–145)
Total Bilirubin: 0.7 mg/dL (ref 0.3–1.2)
Total Protein: 8.1 g/dL (ref 6.5–8.1)

## 2022-10-04 LAB — CBC WITH DIFFERENTIAL/PLATELET
Abs Immature Granulocytes: 4.8 10*3/uL — ABNORMAL HIGH (ref 0.00–0.07)
Band Neutrophils: 9 %
Basophils Absolute: 0 10*3/uL (ref 0.0–0.1)
Basophils Relative: 0 %
Eosinophils Absolute: 0.3 10*3/uL (ref 0.0–0.5)
Eosinophils Relative: 1 %
HCT: 25.7 % — ABNORMAL LOW (ref 36.0–46.0)
Hemoglobin: 8.3 g/dL — ABNORMAL LOW (ref 12.0–15.0)
Lymphocytes Relative: 4 %
Lymphs Abs: 1.2 10*3/uL (ref 0.7–4.0)
MCH: 32 pg (ref 26.0–34.0)
MCHC: 32.3 g/dL (ref 30.0–36.0)
MCV: 99.2 fL (ref 80.0–100.0)
Metamyelocytes Relative: 10 %
Monocytes Absolute: 0.6 10*3/uL (ref 0.1–1.0)
Monocytes Relative: 2 %
Myelocytes: 6 %
Neutro Abs: 23.1 10*3/uL — ABNORMAL HIGH (ref 1.7–7.7)
Neutrophils Relative %: 68 %
Platelets: 187 10*3/uL (ref 150–400)
RBC: 2.59 MIL/uL — ABNORMAL LOW (ref 3.87–5.11)
RDW: 20.1 % — ABNORMAL HIGH (ref 11.5–15.5)
WBC: 30 10*3/uL — ABNORMAL HIGH (ref 4.0–10.5)
nRBC: 3.9 % — ABNORMAL HIGH (ref 0.0–0.2)
nRBC: 4 /100 WBC — ABNORMAL HIGH

## 2022-10-04 LAB — SAMPLE TO BLOOD BANK

## 2022-10-04 LAB — URIC ACID: Uric Acid, Serum: 5.2 mg/dL (ref 2.5–7.1)

## 2022-10-04 LAB — MAGNESIUM: Magnesium: 2 mg/dL (ref 1.7–2.4)

## 2022-10-04 MED ORDER — HEPARIN SOD (PORK) LOCK FLUSH 100 UNIT/ML IV SOLN
500.0000 [IU] | Freq: Once | INTRAVENOUS | Status: AC
  Administered 2022-10-04: 500 [IU] via INTRAVENOUS

## 2022-10-04 MED ORDER — SODIUM CHLORIDE 0.9% FLUSH
10.0000 mL | INTRAVENOUS | Status: DC | PRN
  Administered 2022-10-04: 10 mL via INTRAVENOUS

## 2022-10-04 NOTE — Progress Notes (Signed)
Patients port flushed without difficulty.  Good blood return noted with no bruising or swelling noted at site.  Band aid applied.  VSS with discharge and left in satisfactory condition with no s/s of distress noted.   

## 2022-10-04 NOTE — Progress Notes (Signed)
[facility] [address] [place] [address]    Clinic Day:  10/04/2022  Referring physician: Kerri Perches, MD  Patient Care Team: Kerri Perches, MD as PCP - General Mallipeddi, Orion Modest, MD as PCP - Cardiology (Cardiology) Jena Gauss Gerrit Friends, MD (Gastroenterology) Vickki Hearing, MD as Consulting Physician (Orthopedic Surgery) [name], MD as Medical Oncologist (Oncology) Therese Sarah, RN as Oncology Nurse Navigator (Oncology) Clinton Gallant, RN as [facility] Care Management   ASSESSMENT & PLAN:   Assessment: 1. MPL positive prefibrotic/early primary myelofibrosis: - CBC on 11/06/2020 with white count 75.6, differential 59% neutrophils, 12% monocytes, 4% lymphocytes, 1 percentage of eosinophils and basophils, 6% band neutrophils, 12% myelocytes, 1% promyelocytes, 29% blasts. - Pathologist review of blood smear reported as leukoerythroblastic reaction. - 25 pound weight loss in the last 6 months, unintentional.  Decreased appetite.  Reports fatigue for the last few months.  Reports night sweats x3 in the last 1 month. - Bone marrow biopsy on 11/18/2020 consistent with granulocytic proliferation with differential including CMML versus myeloproliferative disorder. - BCR/ABL was negative. 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 - NGS myeloid panel shows mutations in ASXL1, MPL, TET2, EZH2 mutations. - Spleen ultrasound on 12/16/2020 shows mildly enlarged measuring 12.4 x 9.6 x 7 cm with volume of 435 cc. - PDGFR alpha, beta and FGFR 1 was negative. - She was evaluated by Dr. Lowell Guitar at [facility].  Slides were reviewed at [facility] hematopathology.  They thought it was less likely CMML and more likely MPL positive myeloproliferative neoplasm.  Dr. Lowell Guitar has recommended initiate Jakafi. - Given anemia, B symptoms, cytogenetics with MPL, ASLX1, normal karyotype she is intermediate risk  on GIPPS at high risk MIPSS70+ - Ruxolitinib 10 mg twice daily started on 02/16/2021.  Dose increased to 15 mg twice daily on 05/18/2021. - CTAP on 04/07/2021: Hepatomegaly and splenomegaly measuring 14.3 cm.  No other pathology.  No spleen infarcts. -Ruxolitinib dose increased to 15 mg twice daily on 09/20/2021. - Ultrasound spleen (09/21/2021): 13.6 cm with volume 532 mL.  This is slightly improved from previous volume. - Bone marrow biopsy (09/09/2021): Material is limited but overall features consistent with myeloid neoplasm.  No increase in blasts.  Erythroid precursors appear decreased but megakaryocytes are variably evident with clusters and small forms.  Reticulin's shows variable increase in reticulin fibers including areas with prominent increase.  Chromosome analysis was normal. - Tapering doses of ruxolitinib started on 12/24/2021, last dose on 01/23/2022 - Evaluated by Dr. Lowell Guitar on 12/24/2021. - Momelotinib 100 mg daily started on 01/26/2022, dose increased to 150 mg daily on 07/19/2022 due to generalized itching.  Dose decreased to 100 mg daily on 08/09/2022.  2.  Social/family history: - She lives at home and is able to do all her ADLs and IADLs although she is getting tired lately.  She reports quitting smoking 6 months ago and smoked 1 pack/week for 43 years. - She believes that her mother had some kind of leukemia.  No other malignancies.    Plan: MPL positive myeloproliferative neoplasm: - She is tolerating momelotinib very well. - No recent infections.  She is taking trimethoprim for UTI prophylaxis daily. - Physical exam: Spleen palpable 4 to 6 fingerbreadths below the left costal margin. - Labs from today: Normal LFTs.  White count improved to 30K.  Hemoglobin stable at 8.3 from 09/01/2022.  Platelet count is normal at 187. - Continue  momelotinib 100 mg daily.  Will repeat CBC in 4 weeks to see if she needs any transfusion.  Otherwise RTC in 2 months for follow-up with labs.  Will  also plan on repeating ultrasound of the spleen for measurements.  2.  Decreased appetite: - She is eating better and has gained 1 pound since last visit.  3.  CKD: - Creatinine is 1.54 and better than last visit.  4.  Hypokalemia: - Continue potassium supplements.  Potassium is 4.0 today.   5.  Generalized itching: - Itching has improved since momelotinib started.    Orders Placed This Encounter  Procedures   US SPLEEN (ABDOMEN LIMITED)    Standing Status:   Future    Standing Expiration Date:   10/04/2023    Order Specific Question:   Reason for Exam (SYMPTOM  OR DIAGNOSIS REQUIRED)    Answer:   splenomegaly    Order Specific Question:   Preferred imaging location?    Answer:   [facility]    Order Specific Question:   Release to patient    Answer:   Immediate   CBC with Differential/Platelet    Standing Status:   Future    Standing Expiration Date:   10/04/2023    Order Specific Question:   Release to patient    Answer:   Immediate   Comprehensive metabolic panel    Standing Status:   Future    Standing Expiration Date:   10/04/2023    Order Specific Question:   Release to patient    Answer:   Immediate   Lactate dehydrogenase    Standing Status:   Future    Standing Expiration Date:   10/04/2023    Order Specific Question:   Release to patient    Answer:   Immediate   CBC    Standing Status:   Future    Standing Expiration Date:   10/04/2023    Order Specific Question:   Release to patient    Answer:   Immediate [1]   Sample to Blood Bank(Blood Bank Hold)    Standing Status:   Future    Standing Expiration Date:   10/04/2023      [name],acting as a scribe for [name], MD.,have documented all relevant documentation on the behalf of [name], MD,as directed by  [name], MD while in the presence of [name], MD.  I, [name] MD, have reviewed the above documentation for accuracy and  completeness, and I agree with the above.     [name], MD   8/27/20244:28 PM  CHIEF COMPLAINT:   Diagnosis: MPL positive myeloproliferative neoplasm    Cancer Staging  No matching staging information was found for the patient.    Prior Therapy: Jakafi 10 mg twice daily   Current Therapy:  Momelotinib 100 mg daily    HISTORY OF PRESENT ILLNESS:   Oncology History   No history exists.     INTERVAL HISTORY:   Nonna is a 77 y.o. female presenting to clinic today for follow up of MPL positive myeloproliferative neoplasm. She was last seen by me on 09/01/22.  Today, she states that she is doing well overall. Her appetite level is at 75%. Her energy level is at 100%. She is accompanied by her daughter.  She reports improved appetite and has gained 1 pound since her last visit. She denies any issues with Momelotinib other than numbness in the feet. She is taking Momelotinib as prescribed.  She notes since 8/21 or 8/22 she has had right knee swelling and that last night she felt pins and needles on the tips of her toes, as well as numbness. There was pain upon palpation to the lateral sides of the right knee. She denies ever having gout. She is currently taking Trimethoprim.   PAST MEDICAL HISTORY:   Past Medical History: Past Medical History:  Diagnosis Date   Acute cholangitis    Acute respiratory failure with hypoxia (HCC) 09/01/2021   Allergy    Anemia    Anxiety    Arthritis    Phreesia 03/18/2020   Barrett's esophagus    Cataract    Chronic back pain    Chronic neck pain    CKD (chronic kidney disease) stage 3, GFR 30-59 ml/min (HCC) 10/29/2020   Depression    Genital herpes    GERD (gastroesophageal reflux disease)    H/O degenerative disc disease    History of blood transfusion    Hypertension    Hypoxia 12/01/2021   Insomnia    Lupus (systemic lupus erythematosus) (HCC)    Neuromuscular disorder (HCC)    Osteoarthritis    S/P colonoscopy  07/2003   normal, no polyps   S/P endoscopy June 2005, Oct 2009   2005: short-segment Barrett's, 2009: short-segment Barrett's   Upper respiratory tract infection 07/09/2020   UTI (lower urinary tract infection) 11/2012    Surgical History: Past Surgical History:  Procedure Laterality Date   ABDOMINAL HYSTERECTOMY     BACK SURGERY     BIOPSY N/A 03/20/2014   Procedure: BIOPSY;  Surgeon: Corbin Ade, MD;  Location: [facility];  Service: Endoscopy;  Laterality: N/A;   BIOPSY  09/14/2015   Procedure: BIOPSY;  Surgeon: Corbin Ade, MD;  Location: [facility];  Service: Endoscopy;;  esophageal and gastric   BIOPSY  07/23/2019   Procedure: BIOPSY;  Surgeon: Corbin Ade, MD;  Location: [facility];  Service: Endoscopy;;  esophageal    CARPAL TUNNEL RELEASE Left 2013   cervical disectomy  2002   CESAREAN SECTION N/A    Phreesia 03/18/2020   CHOLECYSTECTOMY     with lysis of adhesions for sbo; "ruptured gallbladder".   COLONOSCOPY  11/09/2011   RMR: Melanosis coli   COLONOSCOPY WITH PROPOFOL N/A 09/14/2015   Dr. Jena Gauss: diverticulosis, 3mm TA removed. next TCS 09/2020.    COLONOSCOPY WITH PROPOFOL N/A 04/30/2020   Procedure: COLONOSCOPY WITH PROPOFOL;  Surgeon: Corbin Ade, MD;  Location: [facility];  Service: Endoscopy;  Laterality: N/A;  PM (ASA 3)   DENTAL SURGERY  11/2015   multiple tooth extraction   ESOPHAGOGASTRODUODENOSCOPY  11/29/2007   salmon-colored  tongue   longest stable at  3 cm, distal esophagus as described previously status post biopsy/ Hiatal hernia, otherwise normal stomach D1 and D2   ESOPHAGOGASTRODUODENOSCOPY  01/06/11   short segment Barrett's esophagus s/p bx/Hiatal hernia   ESOPHAGOGASTRODUODENOSCOPY (EGD) WITH PROPOFOL N/A 03/20/2014   YNW:GNFAOZHY distal esophagus short segment barrett's, bx with no dysplasia. next egd in 03/2017   ESOPHAGOGASTRODUODENOSCOPY (EGD) WITH PROPOFOL N/A 09/14/2015   Dr. Jena Gauss: Barrett's without dysplasia, gastritis benign bx,  hiatal hernia. next EGD 09/2018.   ESOPHAGOGASTRODUODENOSCOPY (EGD) WITH PROPOFOL N/A 07/23/2019   Procedure: ESOPHAGOGASTRODUODENOSCOPY (EGD) WITH PROPOFOL;  Surgeon: Corbin Ade, MD;  Location: [facility];  Service: Endoscopy;  Laterality: N/A;  3:00pm   EYE SURGERY N/A    Phreesia 03/18/2020   HERNIA REPAIR Right 07/2010  Dr. Arlean Hopping   IR IMAGING GUIDED PORT INSERTION  02/09/2021   JOINT REPLACEMENT     LAPAROSCOPIC CHOLECYSTECTOMY  2017   at [facility]   POLYPECTOMY  09/14/2015   Procedure: POLYPECTOMY;  Surgeon: Corbin Ade, MD;  Location: [facility];  Service: Endoscopy;;  ascending colon   right hip replacement  07/2010   went back in sept 2012 to fix   SHOULDER ARTHROSCOPY  2008   left   SPINE SURGERY N/A    Phreesia 03/18/2020   TOTAL HIP REVISION Right 12/17/2012   Procedure: RIGHT TOTAL HIP REVISION;  Surgeon: [name], MD;  Location: [facility];  Service: Orthopedics;  Laterality: Right;   WRIST SURGERY Right 2011   open reduction right wrist.    Social History: Social History   Socioeconomic History   Marital status: Married    Spouse name: louis   Number of children: 4   Years of education: 12+   Highest education level: Some college, no degree  Occupational History   Occupation: Audiological scientist  - retired   Occupation: non profit  Tobacco Use   Smoking status: Former    Current packs/day: 0.00    Average packs/day: 0.3 packs/day for 25.0 years (6.3 ttl pk-yrs)    Types: Cigarettes    Start date: 02/06/1978    Quit date: 02/07/2003    Years since quitting: 19.6   Smokeless tobacco: Never   Tobacco comments:    quit in 2004  Vaping Use   Vaping status: Never Used  Substance and Sexual Activity   Alcohol use: No   Drug use: No   Sexual activity: Not Currently    Birth control/protection: Surgical  Other Topics Concern   Not on file  Social History Narrative   Not on file   Social Determinants of Health   Financial Resource Strain: Low Risk   (12/28/2020)   Overall Financial Resource Strain (CARDIA)    Difficulty of Paying Living Expenses: Not hard at all  Food Insecurity: No Food Insecurity (08/01/2022)   Hunger Vital Sign    Worried About Running Out of Food in the Last Year: Never true    Ran Out of Food in the Last Year: Never true  Transportation Needs: No Transportation Needs (08/01/2022)   PRAPARE - Administrator, Civil Service (Medical): No    Lack of Transportation (Non-Medical): No  Physical Activity: Inactive (04/23/2021)   Exercise Vital Sign    Days of Exercise per Week: 0 days    Minutes of Exercise per Session: 0 min  Stress: Stress Concern Present (04/23/2021)   [employer] of Occupational Health - Occupational Stress Questionnaire    Feeling of Stress : Rather much  Social Connections: Moderately Integrated (12/28/2020)   Social Connection and Isolation Panel [NHANES]    Frequency of Communication with Friends and Family: More than three times a week    Frequency of Social Gatherings with Friends and Family: Once a week    Attends Religious Services: More than 4 times per year    Active Member of [employer] or Organizations: No    Attends Banker Meetings: Never    Marital Status: Married  Catering manager Violence: Not At Risk (07/26/2022)   Humiliation, Afraid, Rape, and Kick questionnaire    Fear of Current or Ex-Partner: No    Emotionally Abused: No    Physically Abused: No    Sexually Abused: No    Family History: Family History  Problem  Relation Age of Onset   Hypertension Mother    Stroke Mother    Colon cancer Neg Hx    Anesthesia problems Neg Hx    Hypotension Neg Hx    Malignant hyperthermia Neg Hx    Pseudochol deficiency Neg Hx    Gastric cancer Neg Hx    Esophageal cancer Neg Hx     Current Medications:  Current Outpatient Medications:    Allopurinol 200 MG TABS, Take 200 mg by mouth daily., Disp: 30 tablet, Rfl: 3   cholecalciferol (CHOLECALCIFEROL) 25  MCG tablet, Take 1 tablet (1,000 Units total) by mouth daily., Disp: , Rfl:    EPINEPHrine 0.3 mg/0.3 mL IJ SOAJ injection, INJECT 0.3 MLS INTO MUSCLE ONCE AS NEEDED, Disp: 1 each, Rfl: 2   furosemide (LASIX) 20 MG tablet, TAKE 1 TABLET BY MOUTH ONCE DAILY AS NEEDED, Disp: 30 tablet, Rfl: 3   hydrOXYzine (ATARAX) 50 MG tablet, Take 1 tablet (50 mg total) by mouth 3 (three) times daily as needed., Disp: 90 tablet, Rfl: 1   lidocaine-prilocaine (EMLA) cream, Apply 1 Application topically as needed (Access to port and pain)., Disp: 30 g, Rfl: 6   metoprolol tartrate (LOPRESSOR) 25 MG tablet, Take 0.5 tablets (12.5 mg total) by mouth 2 (two) times daily., Disp: , Rfl:    mirtazapine (REMERON) 15 MG tablet, Take 1 tablet (15 mg total) by mouth at bedtime., Disp: 30 tablet, Rfl: 5   momelotinib dihydrochloride (OJJAARA) 100 MG tablet, Take 1 tablet (100 mg total) by mouth daily., Disp: 30 tablet, Rfl: 3   Multiple Vitamin (MULTIVITAMIN WITH MINERALS) TABS tablet, Take 1 tablet by mouth daily., Disp: , Rfl:    ondansetron (ZOFRAN-ODT) 8 MG disintegrating tablet, Take 1 tablet (8 mg total) by mouth every 8 (eight) hours as needed for nausea or vomiting., Disp: 20 tablet, Rfl: 0   oxyCODONE (ROXICODONE) 15 MG immediate release tablet, Take 15 mg by mouth every 6 (six) hours as needed., Disp: , Rfl:    potassium chloride (KLOR-CON) 10 MEQ tablet, Take 1 tablet by mouth once daily, Disp: 30 tablet, Rfl: 4   prochlorperazine (COMPAZINE) 10 MG tablet, Take 1 tablet (10 mg total) by mouth every 6 (six) hours as needed for nausea or vomiting., Disp: 60 tablet, Rfl: 1   trimethoprim (TRIMPEX) 100 MG tablet, Take 1 tablet (100 mg total) by mouth daily., Disp: 30 tablet, Rfl: 11 No current facility-administered medications for this visit.  Facility-Administered Medications Ordered in Other Visits:    lanreotide acetate (SOMATULINE DEPOT) 120 MG/0.5ML injection, , , ,    lanreotide acetate (SOMATULINE DEPOT) 120  MG/0.5ML injection, , , ,    lanreotide acetate (SOMATULINE DEPOT) 120 MG/0.5ML injection, , , ,    octreotide (SANDOSTATIN LAR) 30 MG IM injection, , , ,    octreotide (SANDOSTATIN LAR) 30 MG IM injection, , , ,    sodium chloride flush (NS) 0.9 % injection 10 mL, 10 mL, Intravenous, PRN, [name], MD, 10 mL at 10/04/22 1448   Allergies: Allergies  Allergen Reactions   Bee Venom Swelling and Hives   Norvasc [Amlodipine] Other (See Comments)    Hair loss   Tylenol [Acetaminophen] Itching   Hydralazine Other (See Comments)    Hair loss   Red Dye #40 (Allura Red) Itching   Percocet [Oxycodone-Acetaminophen] Rash and Other (See Comments)    Pt states, "this gives her a rash, but at home she takes oxycodone for pain relief"    Tyloxapol Itching  and Rash    REVIEW OF SYSTEMS:   Review of Systems  Constitutional:  Negative for chills, fatigue and fever.  HENT:   Negative for lump/mass, mouth sores, nosebleeds, sore throat and trouble swallowing.   Eyes:  Negative for eye problems.  Respiratory:  Negative for cough and shortness of breath.   Cardiovascular:  Negative for chest pain, leg swelling and palpitations.  Gastrointestinal:  Negative for abdominal pain, constipation, diarrhea, nausea and vomiting.  Genitourinary:  Negative for bladder incontinence, difficulty urinating, dysuria, frequency, hematuria and nocturia.   Musculoskeletal:  Negative for arthralgias, back pain, flank pain, myalgias and neck pain.  Skin:  Negative for itching and rash.  Neurological:  Positive for numbness (in feet). Negative for dizziness and headaches.  Hematological:  Does not bruise/bleed easily.  Psychiatric/Behavioral:  Negative for depression, sleep disturbance and suicidal ideas. The patient is not nervous/anxious.   All other systems reviewed and are negative.    VITALS:   Blood pressure (!) 158/78, pulse 66, temperature 98.4 F (36.9 C), temperature source Oral, resp. rate 16,  weight 115 lb 1.6 oz (52.2 kg), SpO2 100%.  Wt Readings from Last 3 Encounters:  10/04/22 115 lb 1.6 oz (52.2 kg)  09/07/22 114 lb 9.6 oz (52 kg)  08/24/22 113 lb 12.8 oz (51.6 kg)    Body mass index is 19.15 kg/m.  Performance status (ECOG): 1 - Symptomatic but completely ambulatory  PHYSICAL EXAM:   Physical Exam Vitals and nursing note reviewed. Exam conducted with a chaperone present.  Constitutional:      Appearance: Normal appearance.  Cardiovascular:     Rate and Rhythm: Normal rate and regular rhythm.     Pulses: Normal pulses.     Heart sounds: Normal heart sounds.     Comments: +slight swelling in the right knee Pulmonary:     Effort: Pulmonary effort is normal.     Breath sounds: Normal breath sounds.  Abdominal:     Palpations: Abdomen is soft. There is no hepatomegaly, splenomegaly or mass.     Tenderness: There is no abdominal tenderness.     Comments: +spleen palpable, 6 finger breaths below left costal margin  Musculoskeletal:     Right lower leg: No edema.     Left lower leg: No edema.  Lymphadenopathy:     Cervical: No cervical adenopathy.     Right cervical: No superficial, deep or posterior cervical adenopathy.    Left cervical: No superficial, deep or posterior cervical adenopathy.     Upper Body:     Right upper body: No supraclavicular or axillary adenopathy.     Left upper body: No supraclavicular or axillary adenopathy.     Comments:    Neurological:     General: No focal deficit present.     Mental Status: She is alert and oriented to person, place, and time.  Psychiatric:        Mood and Affect: Mood normal.        Behavior: Behavior normal.     LABS:      Latest Ref Rng & Units 10/04/2022    2:48 PM 09/01/2022    1:04 PM 08/08/2022    1:28 PM  CBC  WBC 4.0 - 10.5 K/uL 30.0  40.9  44.9   Hemoglobin 12.0 - 15.0 g/dL 8.3  8.5  7.2   Hematocrit 36.0 - 46.0 % 25.7  27.1  23.0   Platelets 150 - 400 K/uL 187  240  119  Latest Ref  Rng & Units 10/04/2022    2:48 PM 09/01/2022    1:04 PM 08/08/2022    1:28 PM  CMP  Glucose 70 - 99 mg/dL 387  564  332   BUN 8 - 23 mg/dL 28  34  37   Creatinine 0.44 - 1.00 mg/dL 9.51  8.84  1.66   Sodium 135 - 145 mmol/L 138  137  137   Potassium 3.5 - 5.1 mmol/L 4.0  3.9  3.8   Chloride 98 - 111 mmol/L 109  108  109   CO2 22 - 32 mmol/L 22  23  20    Calcium 8.9 - 10.3 mg/dL 8.7  8.4  8.2   Total Protein 6.5 - 8.1 g/dL 8.1  7.8  7.3   Total Bilirubin 0.3 - 1.2 mg/dL 0.7  0.8  0.8   Alkaline Phos 38 - 126 U/L 124  130  92   AST 15 - 41 U/L 26  30  25    ALT 0 - 44 U/L 21  18  19       No results found for: "CEA1", "CEA" / No results found for: "CEA1", "CEA" No results found for: "PSA1" No results found for: "AYT016" No results found for: "CAN125"  No results found for: "TOTALPROTELP", "ALBUMINELP", "A1GS", "A2GS", "BETS", "BETA2SER", "GAMS", "MSPIKE", "SPEI" Lab Results  Component Value Date   TIBC 329 08/25/2015   TIBC 336 02/21/2011   FERRITIN 26 08/25/2015   FERRITIN 81 02/21/2011   IRONPCTSAT 8 (L) 08/25/2015   IRONPCTSAT 23 02/21/2011   Lab Results  Component Value Date   LDH 588 (H) 07/19/2022   LDH 620 (H) 06/07/2022   LDH 832 (H) 04/21/2022     STUDIES:   No results found.

## 2022-10-04 NOTE — Patient Instructions (Addendum)
[facility]  Discharge Instructions  You were seen and examined today by Dr. Ellin Saba.  Dr. Ellin Saba will have the nurse call you if there is any problems with your labs.  Continue taking the Ojjaara as prescribed.  Follow-up as scheduled in 2 months.    Thank you for choosing [facility] to provide your oncology and hematology care.   To afford each patient quality time with our provider, please arrive at least 15 minutes before your scheduled appointment time. You may need to reschedule your appointment if you arrive late (10 or more minutes). Arriving late affects you and other patients whose appointments are after yours.  Also, if you miss three or more appointments without notifying the office, you may be dismissed from the clinic at the provider's discretion.    Again, thank you for choosing [facility].  Our hope is that these requests will decrease the amount of time that you wait before being seen by our physicians.   If you have a lab appointment with the Cancer Center - please note that after April 8th, all labs will be drawn in the cancer center.  You do not have to check in or register with the main entrance as you have in the past but will complete your check-in at the cancer center.            _____________________________________________________________  Should you have questions after your visit to [facility], please contact our office at [phone] and follow the prompts.  Our office hours are 8:00 a.m. to 4:30 p.m. Monday - Thursday and 8:00 a.m. to 2:30 p.m. Friday.  Please note that voicemails left after 4:00 p.m. may not be returned until the following business day.  We are closed weekends and all major holidays.  You do have access to a nurse 24-7, just call the main number to the clinic [phone] and do not press any options, hold on the line and a nurse will answer the phone.     For prescription refill requests, have your pharmacy contact our office and allow 72 hours.    Masks are no longer required in the cancer centers. If you would like for your care team to wear a mask while they are taking care of you, please let them know. You may have one support person who is at least 77 years old accompany you for your appointments.

## 2022-10-05 ENCOUNTER — Ambulatory Visit (INDEPENDENT_AMBULATORY_CARE_PROVIDER_SITE_OTHER): Payer: Medicare Other | Admitting: Family Medicine

## 2022-10-05 ENCOUNTER — Other Ambulatory Visit: Payer: Self-pay | Admitting: Hematology

## 2022-10-05 ENCOUNTER — Encounter: Payer: Self-pay | Admitting: Family Medicine

## 2022-10-05 VITALS — BP 160/72 | HR 65 | Ht 65.0 in | Wt 113.1 lb

## 2022-10-05 DIAGNOSIS — N390 Urinary tract infection, site not specified: Secondary | ICD-10-CM

## 2022-10-05 DIAGNOSIS — F418 Other specified anxiety disorders: Secondary | ICD-10-CM | POA: Diagnosis not present

## 2022-10-05 DIAGNOSIS — M25461 Effusion, right knee: Secondary | ICD-10-CM

## 2022-10-05 DIAGNOSIS — F324 Major depressive disorder, single episode, in partial remission: Secondary | ICD-10-CM | POA: Diagnosis not present

## 2022-10-05 DIAGNOSIS — Z23 Encounter for immunization: Secondary | ICD-10-CM

## 2022-10-05 DIAGNOSIS — D471 Chronic myeloproliferative disease: Secondary | ICD-10-CM

## 2022-10-05 DIAGNOSIS — R52 Pain, unspecified: Secondary | ICD-10-CM | POA: Diagnosis not present

## 2022-10-05 DIAGNOSIS — I10 Essential (primary) hypertension: Secondary | ICD-10-CM | POA: Diagnosis not present

## 2022-10-05 DIAGNOSIS — F5105 Insomnia due to other mental disorder: Secondary | ICD-10-CM | POA: Diagnosis not present

## 2022-10-05 NOTE — Assessment & Plan Note (Signed)
One week history after being on her feet, shopping, refer Ortho

## 2022-10-05 NOTE — Patient Instructions (Signed)
F/U in 8 to 10 weeks , call if you need me sooner  Thankful that you feel and are doing much better  You are referred to Orthopedics re right knee  swelling x 1 week  Be careful not to fall  Flu vaccine in office today  Thanks for choosing [facility], we consider it a privelige to serve you.

## 2022-10-06 DIAGNOSIS — I129 Hypertensive chronic kidney disease with stage 1 through stage 4 chronic kidney disease, or unspecified chronic kidney disease: Secondary | ICD-10-CM | POA: Diagnosis not present

## 2022-10-06 DIAGNOSIS — C946 Myelodysplastic disease, not classified: Secondary | ICD-10-CM | POA: Diagnosis not present

## 2022-10-06 DIAGNOSIS — N184 Chronic kidney disease, stage 4 (severe): Secondary | ICD-10-CM | POA: Diagnosis not present

## 2022-10-06 DIAGNOSIS — N179 Acute kidney failure, unspecified: Secondary | ICD-10-CM | POA: Diagnosis not present

## 2022-10-06 DIAGNOSIS — D630 Anemia in neoplastic disease: Secondary | ICD-10-CM | POA: Diagnosis not present

## 2022-10-06 DIAGNOSIS — D631 Anemia in chronic kidney disease: Secondary | ICD-10-CM | POA: Diagnosis not present

## 2022-10-10 ENCOUNTER — Encounter: Payer: Self-pay | Admitting: Family Medicine

## 2022-10-10 NOTE — Assessment & Plan Note (Addendum)
DASH diet and commitment to daily physical activity for a minimum of 30 minutes discussed and encouraged, as a part of hypertension management.    10/05/2022    2:18 PM 10/05/2022    1:38 PM 10/05/2022    1:37 PM 10/04/2022    2:42 PM 09/13/2022    1:18 PM 09/07/2022   11:04 AM 09/01/2022   12:52 PM  BP/Weight  Systolic BP 160 176 167 158 90 90 102  Diastolic BP 72 79 72 78 57 50 62  Wt. (Lbs)   113.08 115.1  114.6   BMI   18.82 kg/m2 19.15 kg/m2  19.07 kg/m2      No med change, mnaaged by Cardiology, has labile hypertension

## 2022-10-10 NOTE — Assessment & Plan Note (Signed)
Good response to current treatment finally with WBC at 30,000, continue management by Oncology

## 2022-10-10 NOTE — Assessment & Plan Note (Signed)
Managed through hospice

## 2022-10-10 NOTE — Assessment & Plan Note (Signed)
Controlled, no change in medication Sleep hygiene reviewed and written information offered also. Prescription sent for  medication needed. Continue current med

## 2022-10-10 NOTE — Assessment & Plan Note (Signed)
Maintained on daily  septra

## 2022-10-10 NOTE — Progress Notes (Signed)
Renee Waters     MRN: [id number]      DOB: December 15, 1945  Chief Complaint  Patient presents with   Follow-up    Follow up    HPI Renee Waters is here for follow up and re-evaluation of chronic medical conditions, medication management and review of any available recent lab and radiology data.  Preventive health is updated, specifically  Cancer screening and Immunization.   Questions or concerns regarding consultations or procedures which the PT has had in the interim are  addressed. Great news this visit with white blood cell count the lowest it has been since first diagnosed 2 years ago and HB holding steady, also has more energy 1 week h/o acute swelling of right knee started after being on her feet shopping for a prolonged time, able to weight bear, no falls, however increased pain  ROS Denies recent fever or chills. Denies sinus pressure, nasal congestion, ear pain or sore throat. Denies chest congestion, productive cough or wheezing. Denies chest pains, palpitations and leg swelling Denies abdominal pain, nausea, vomiting,diarrhea or constipation.   Denies dysuria, frequency, hesitancy or incontinence. Chronic  joint pain, and limitation in mobility. Denies headaches, seizures, Denies depression, anxiety or insomnia. Denies skin break down or rash.   PE  BP (!) 160/72   Pulse 65   Ht 5\' 5"  (1.651 m)   Wt 113 lb 1.3 oz (51.3 kg)   SpO2 94%   BMI 18.82 kg/m   Patient alert and oriented and in no cardiopulmonary distress.  HEENT: No facial asymmetry, EOMI,     Neck decreased though adequate ROM.  Chest: Clear to auscultation bilaterally.  CVS: S1, S2  murmur, no S3.Regular rate.  ABD: Soft non tender.   Ext: No edema  MS: decreased  ROM spine, shoulders, hips and knees. Right knee swollen and tender with reduced ROM Skin: Intact, no ulcerations or rash noted.  Psych: Good eye contact, normal affect. Memory intact not anxious or depressed appearing.  CNS: CN  2-12 intact, power,  normal throughout.no focal deficits noted.   Assessment & Plan  Swelling of joint, knee, right One week history after being on her feet, shopping, refer Ortho  Essential hypertension DASH diet and commitment to daily physical activity for a minimum of 30 minutes discussed and encouraged, as a part of hypertension management.    10/05/2022    2:18 PM 10/05/2022    1:38 PM 10/05/2022    1:37 PM 10/04/2022    2:42 PM 09/13/2022    1:18 PM 09/07/2022   11:04 AM 09/01/2022   12:52 PM  BP/Weight  Systolic BP 160 176 167 158 90 90 102  Diastolic BP 72 79 72 78 57 50 62  Wt. (Lbs)   113.08 115.1  114.6   BMI   18.82 kg/m2 19.15 kg/m2  19.07 kg/m2      No med change, mnaaged by Cardiology, has labile hypertension  Recurrent UTI Maintained on daily septra  MPN (myeloproliferative neoplasm) (HCC) Good response to current treatment finally with WBC at 30,000, continue management by Oncology   Insomnia secondary to depression with anxiety Controlled, no change in medication Sleep hygiene reviewed and written information offered also. Prescription sent for  medication needed. Continue current med  Depression, major, single episode, in partial remission (HCC) Doing well currently, physical health has improved , which is wonderful Continue current med, no need for therapy at this time , which has been a challenge to obtain  Generalized pain Managed  through hospice

## 2022-10-10 NOTE — Assessment & Plan Note (Signed)
Doing well currently, physical health has improved , which is wonderful Continue current med, no need for therapy at this time , which has been a challenge to obtain

## 2022-10-11 ENCOUNTER — Other Ambulatory Visit (HOSPITAL_COMMUNITY): Payer: Self-pay

## 2022-10-11 ENCOUNTER — Ambulatory Visit (HOSPITAL_COMMUNITY)
Admission: RE | Admit: 2022-10-11 | Discharge: 2022-10-11 | Disposition: A | Discharge status: Home / Self Care | Payer: Medicare Other | Source: Ambulatory Visit | Attending: Hematology | Admitting: Hematology

## 2022-10-11 DIAGNOSIS — M1990 Unspecified osteoarthritis, unspecified site: Secondary | ICD-10-CM | POA: Diagnosis not present

## 2022-10-11 DIAGNOSIS — N184 Chronic kidney disease, stage 4 (severe): Secondary | ICD-10-CM | POA: Diagnosis not present

## 2022-10-11 DIAGNOSIS — F419 Anxiety disorder, unspecified: Secondary | ICD-10-CM | POA: Diagnosis not present

## 2022-10-11 DIAGNOSIS — Z87891 Personal history of nicotine dependence: Secondary | ICD-10-CM | POA: Diagnosis not present

## 2022-10-11 DIAGNOSIS — I129 Hypertensive chronic kidney disease with stage 1 through stage 4 chronic kidney disease, or unspecified chronic kidney disease: Secondary | ICD-10-CM | POA: Diagnosis not present

## 2022-10-11 DIAGNOSIS — R161 Splenomegaly, not elsewhere classified: Secondary | ICD-10-CM

## 2022-10-11 DIAGNOSIS — C946 Myelodysplastic disease, not classified: Secondary | ICD-10-CM | POA: Diagnosis not present

## 2022-10-11 DIAGNOSIS — D631 Anemia in chronic kidney disease: Secondary | ICD-10-CM | POA: Diagnosis not present

## 2022-10-11 DIAGNOSIS — Z87440 Personal history of urinary (tract) infections: Secondary | ICD-10-CM | POA: Diagnosis not present

## 2022-10-11 DIAGNOSIS — G8929 Other chronic pain: Secondary | ICD-10-CM | POA: Diagnosis not present

## 2022-10-11 DIAGNOSIS — F32A Depression, unspecified: Secondary | ICD-10-CM | POA: Diagnosis not present

## 2022-10-11 DIAGNOSIS — E876 Hypokalemia: Secondary | ICD-10-CM | POA: Diagnosis not present

## 2022-10-11 DIAGNOSIS — G4700 Insomnia, unspecified: Secondary | ICD-10-CM | POA: Diagnosis not present

## 2022-10-11 DIAGNOSIS — M542 Cervicalgia: Secondary | ICD-10-CM | POA: Diagnosis not present

## 2022-10-11 DIAGNOSIS — N179 Acute kidney failure, unspecified: Secondary | ICD-10-CM | POA: Diagnosis not present

## 2022-10-11 DIAGNOSIS — D649 Anemia, unspecified: Secondary | ICD-10-CM | POA: Diagnosis not present

## 2022-10-11 DIAGNOSIS — D471 Chronic myeloproliferative disease: Secondary | ICD-10-CM

## 2022-10-11 DIAGNOSIS — Z556 Problems related to health literacy: Secondary | ICD-10-CM | POA: Diagnosis not present

## 2022-10-11 DIAGNOSIS — M329 Systemic lupus erythematosus, unspecified: Secondary | ICD-10-CM | POA: Diagnosis not present

## 2022-10-11 DIAGNOSIS — D630 Anemia in neoplastic disease: Secondary | ICD-10-CM | POA: Diagnosis not present

## 2022-10-11 DIAGNOSIS — K2270 Barrett's esophagus without dysplasia: Secondary | ICD-10-CM | POA: Diagnosis not present

## 2022-10-11 DIAGNOSIS — M519 Unspecified thoracic, thoracolumbar and lumbosacral intervertebral disc disorder: Secondary | ICD-10-CM | POA: Diagnosis not present

## 2022-10-11 DIAGNOSIS — Z9181 History of falling: Secondary | ICD-10-CM | POA: Diagnosis not present

## 2022-10-12 DIAGNOSIS — H532 Diplopia: Secondary | ICD-10-CM | POA: Diagnosis not present

## 2022-10-12 DIAGNOSIS — H524 Presbyopia: Secondary | ICD-10-CM | POA: Diagnosis not present

## 2022-10-13 DIAGNOSIS — I129 Hypertensive chronic kidney disease with stage 1 through stage 4 chronic kidney disease, or unspecified chronic kidney disease: Secondary | ICD-10-CM

## 2022-10-13 DIAGNOSIS — C946 Myelodysplastic disease, not classified: Secondary | ICD-10-CM

## 2022-10-13 DIAGNOSIS — F32A Depression, unspecified: Secondary | ICD-10-CM

## 2022-10-13 DIAGNOSIS — F419 Anxiety disorder, unspecified: Secondary | ICD-10-CM

## 2022-10-13 DIAGNOSIS — N179 Acute kidney failure, unspecified: Secondary | ICD-10-CM

## 2022-10-13 DIAGNOSIS — D631 Anemia in chronic kidney disease: Secondary | ICD-10-CM

## 2022-10-13 DIAGNOSIS — M329 Systemic lupus erythematosus, unspecified: Secondary | ICD-10-CM

## 2022-10-13 DIAGNOSIS — G8929 Other chronic pain: Secondary | ICD-10-CM

## 2022-10-13 DIAGNOSIS — D630 Anemia in neoplastic disease: Secondary | ICD-10-CM

## 2022-10-13 DIAGNOSIS — G4700 Insomnia, unspecified: Secondary | ICD-10-CM

## 2022-10-13 DIAGNOSIS — M1990 Unspecified osteoarthritis, unspecified site: Secondary | ICD-10-CM

## 2022-10-13 DIAGNOSIS — N184 Chronic kidney disease, stage 4 (severe): Secondary | ICD-10-CM

## 2022-10-14 DIAGNOSIS — C946 Myelodysplastic disease, not classified: Secondary | ICD-10-CM | POA: Diagnosis not present

## 2022-10-14 DIAGNOSIS — D630 Anemia in neoplastic disease: Secondary | ICD-10-CM | POA: Diagnosis not present

## 2022-10-14 DIAGNOSIS — N179 Acute kidney failure, unspecified: Secondary | ICD-10-CM | POA: Diagnosis not present

## 2022-10-14 DIAGNOSIS — N184 Chronic kidney disease, stage 4 (severe): Secondary | ICD-10-CM | POA: Diagnosis not present

## 2022-10-14 DIAGNOSIS — D631 Anemia in chronic kidney disease: Secondary | ICD-10-CM | POA: Diagnosis not present

## 2022-10-14 DIAGNOSIS — I129 Hypertensive chronic kidney disease with stage 1 through stage 4 chronic kidney disease, or unspecified chronic kidney disease: Secondary | ICD-10-CM | POA: Diagnosis not present

## 2022-10-17 ENCOUNTER — Other Ambulatory Visit: Payer: Self-pay | Admitting: Internal Medicine

## 2022-10-17 ENCOUNTER — Other Ambulatory Visit: Payer: Self-pay | Admitting: Hematology

## 2022-10-17 NOTE — ED Provider Notes (Signed)
Addendum to visit on 07/21/22. Exact critical care time 33 minutes.   Olene Floss, PA-C 10/17/22 1610    Pricilla Loveless, MD 10/17/22 [phone]

## 2022-10-18 ENCOUNTER — Other Ambulatory Visit (INDEPENDENT_AMBULATORY_CARE_PROVIDER_SITE_OTHER): Payer: Medicare Other

## 2022-10-18 ENCOUNTER — Ambulatory Visit (INDEPENDENT_AMBULATORY_CARE_PROVIDER_SITE_OTHER): Payer: Medicare Other | Admitting: Orthopaedic Surgery

## 2022-10-18 ENCOUNTER — Encounter: Payer: Self-pay | Admitting: Orthopaedic Surgery

## 2022-10-18 ENCOUNTER — Telehealth: Payer: Self-pay | Admitting: Orthopaedic Surgery

## 2022-10-18 VITALS — BP 97/54 | HR 50 | Ht 65.0 in | Wt 117.0 lb

## 2022-10-18 DIAGNOSIS — M25561 Pain in right knee: Secondary | ICD-10-CM

## 2022-10-18 DIAGNOSIS — G8929 Other chronic pain: Secondary | ICD-10-CM | POA: Diagnosis not present

## 2022-10-18 DIAGNOSIS — M25461 Effusion, right knee: Secondary | ICD-10-CM

## 2022-10-18 NOTE — Progress Notes (Signed)
Subjective:    Patient ID: Renee Waters, female    DOB: 09/18/1945, 77 y.o.   MRN: [id number]  HPI  She has had pain in the right knee for about six weeks and swelling.  She has no trauma. She has no giving way.  She has popping.  It is not getting any better. Tylenol does not help, Ice helps a little.  She uses a cane.  She cannot take NSAIDs.  Review of Systems  Constitutional:  Positive for activity change.  Musculoskeletal:  Positive for arthralgias, gait problem and joint swelling.  All other systems reviewed and are negative. For Review of Systems, all other systems reviewed and are negative.  The following is a summary of the past history medically, past history surgically, known current medicines, social history and family history.  This information is gathered electronically by the computer from prior information and documentation.  I review this each visit and have found including this information at this point in the chart is beneficial and informative.   Past Medical History:  Diagnosis Date   Acute cholangitis    Acute respiratory failure with hypoxia (HCC) 09/01/2021   Allergy    Anemia    Anxiety    Arthritis    Phreesia 03/18/2020   Barrett's esophagus    Cataract    Chronic back pain    Chronic neck pain    CKD (chronic kidney disease) stage 3, GFR 30-59 ml/min (HCC) 10/29/2020   Depression    Genital herpes    GERD (gastroesophageal reflux disease)    H/O degenerative disc disease    History of blood transfusion    Hypertension    Hypoxia 12/01/2021   Insomnia    Lupus (systemic lupus erythematosus) (HCC)    Neuromuscular disorder (HCC)    Osteoarthritis    S/P colonoscopy 07/2003   normal, no polyps   S/P endoscopy June 2005, Oct 2009   2005: short-segment Barrett's, 2009: short-segment Barrett's   Upper respiratory tract infection 07/09/2020   UTI (lower urinary tract infection) 11/2012    Past Surgical History:  Procedure Laterality Date    ABDOMINAL HYSTERECTOMY     BACK SURGERY     BIOPSY N/A 03/20/2014   Procedure: BIOPSY;  Surgeon: Corbin Ade, MD;  Location: [facility];  Service: Endoscopy;  Laterality: N/A;   BIOPSY  09/14/2015   Procedure: BIOPSY;  Surgeon: Corbin Ade, MD;  Location: [facility];  Service: Endoscopy;;  esophageal and gastric   BIOPSY  07/23/2019   Procedure: BIOPSY;  Surgeon: Corbin Ade, MD;  Location: [facility];  Service: Endoscopy;;  esophageal    CARPAL TUNNEL RELEASE Left 2013   cervical disectomy  2002   CESAREAN SECTION N/A    Phreesia 03/18/2020   CHOLECYSTECTOMY     with lysis of adhesions for sbo; "ruptured gallbladder".   COLONOSCOPY  11/09/2011   RMR: Melanosis coli   COLONOSCOPY WITH PROPOFOL N/A 09/14/2015   Dr. Jena Gauss: diverticulosis, 3mm TA removed. next TCS 09/2020.    COLONOSCOPY WITH PROPOFOL N/A 04/30/2020   Procedure: COLONOSCOPY WITH PROPOFOL;  Surgeon: Corbin Ade, MD;  Location: [facility];  Service: Endoscopy;  Laterality: N/A;  PM (ASA 3)   DENTAL SURGERY  11/2015   multiple tooth extraction   ESOPHAGOGASTRODUODENOSCOPY  11/29/2007   salmon-colored  tongue   longest stable at  3 cm, distal esophagus as described previously status post biopsy/ Hiatal hernia, otherwise normal stomach D1 and D2  ESOPHAGOGASTRODUODENOSCOPY  01/06/11   short segment Barrett's esophagus s/p bx/Hiatal hernia   ESOPHAGOGASTRODUODENOSCOPY (EGD) WITH PROPOFOL N/A 03/20/2014   [id number] distal esophagus short segment barrett's, bx with no dysplasia. next egd in 03/2017   ESOPHAGOGASTRODUODENOSCOPY (EGD) WITH PROPOFOL N/A 09/14/2015   Dr. Jena Gauss: Barrett's without dysplasia, gastritis benign bx, hiatal hernia. next EGD 09/2018.   ESOPHAGOGASTRODUODENOSCOPY (EGD) WITH PROPOFOL N/A 07/23/2019   Procedure: ESOPHAGOGASTRODUODENOSCOPY (EGD) WITH PROPOFOL;  Surgeon: Corbin Ade, MD;  Location: [facility];  Service: Endoscopy;  Laterality: N/A;  3:00pm   EYE SURGERY N/A    Phreesia 03/18/2020    HERNIA REPAIR Right 07/2010   Dr. Arlean Hopping   IR IMAGING GUIDED PORT INSERTION  02/09/2021   JOINT REPLACEMENT     LAPAROSCOPIC CHOLECYSTECTOMY  2017   at [facility]   POLYPECTOMY  09/14/2015   Procedure: POLYPECTOMY;  Surgeon: Corbin Ade, MD;  Location: [facility];  Service: Endoscopy;;  ascending colon   right hip replacement  07/2010   went back in sept 2012 to fix   SHOULDER ARTHROSCOPY  2008   left   SPINE SURGERY N/A    Phreesia 03/18/2020   TOTAL HIP REVISION Right 12/17/2012   Procedure: RIGHT TOTAL HIP REVISION;  Surgeon: [name], MD;  Location: [facility];  Service: Orthopedics;  Laterality: Right;   WRIST SURGERY Right 2011   open reduction right wrist.    Current Outpatient Medications on File Prior to Visit  Medication Sig Dispense Refill   allopurinol (ZYLOPRIM) 100 MG tablet Take 2 tablets by mouth once daily 60 tablet 0   cholecalciferol (CHOLECALCIFEROL) 25 MCG tablet Take 1 tablet (1,000 Units total) by mouth daily.     EPINEPHrine 0.3 mg/0.3 mL IJ SOAJ injection INJECT 0.3 MLS INTO MUSCLE ONCE AS NEEDED 1 each 2   furosemide (LASIX) 20 MG tablet TAKE 1 TABLET BY MOUTH ONCE DAILY AS NEEDED 30 tablet 3   hydrOXYzine (ATARAX) 50 MG tablet Take 1 tablet (50 mg total) by mouth 3 (three) times daily as needed. 90 tablet 1   lidocaine-prilocaine (EMLA) cream Apply 1 Application topically as needed (Access to port and pain). 30 g 6   metoprolol tartrate (LOPRESSOR) 25 MG tablet Take 0.5 tablets (12.5 mg total) by mouth 2 (two) times daily.     mirtazapine (REMERON) 15 MG tablet Take 1 tablet (15 mg total) by mouth at bedtime. 30 tablet 5   momelotinib dihydrochloride (OJJAARA) 100 MG tablet Take 1 tablet (100 mg total) by mouth daily. 30 tablet 3   Multiple Vitamin (MULTIVITAMIN WITH MINERALS) TABS tablet Take 1 tablet by mouth daily.     ondansetron (ZOFRAN-ODT) 8 MG disintegrating tablet Take 1 tablet (8 mg total) by mouth every 8 (eight) hours as needed for nausea or  vomiting. 20 tablet 0   oxyCODONE (ROXICODONE) 15 MG immediate release tablet Take 15 mg by mouth every 6 (six) hours as needed.     potassium chloride (KLOR-CON) 10 MEQ tablet Take 1 tablet by mouth once daily 30 tablet 0   prochlorperazine (COMPAZINE) 10 MG tablet Take 1 tablet (10 mg total) by mouth every 6 (six) hours as needed for nausea or vomiting. 60 tablet 1   trimethoprim (TRIMPEX) 100 MG tablet Take 1 tablet (100 mg total) by mouth daily. 30 tablet 11   Current Facility-Administered Medications on File Prior to Visit  Medication Dose Route Frequency Provider Last Rate Last Admin   lanreotide acetate (SOMATULINE DEPOT) 120 MG/0.5ML injection  lanreotide acetate (SOMATULINE DEPOT) 120 MG/0.5ML injection            lanreotide acetate (SOMATULINE DEPOT) 120 MG/0.5ML injection            octreotide (SANDOSTATIN LAR) 30 MG IM injection            octreotide (SANDOSTATIN LAR) 30 MG IM injection             Social History   Socioeconomic History   Marital status: Married    Spouse name: louis   Number of children: 4   Years of education: 12+   Highest education level: Some college, no degree  Occupational History   Occupation: Audiological scientist  - retired   Occupation: non profit  Tobacco Use   Smoking status: Former    Current packs/day: 0.00    Average packs/day: 0.3 packs/day for 25.0 years (6.3 ttl pk-yrs)    Types: Cigarettes    Start date: 02/06/1978    Quit date: 02/07/2003    Years since quitting: 19.7   Smokeless tobacco: Never   Tobacco comments:    quit in 2004  Vaping Use   Vaping status: Never Used  Substance and Sexual Activity   Alcohol use: No   Drug use: No   Sexual activity: Not Currently    Birth control/protection: Surgical  Other Topics Concern   Not on file  Social History Narrative   Not on file   Social Determinants of Health   Financial Resource Strain: Low Risk  (12/28/2020)   Overall Financial Resource Strain (CARDIA)    Difficulty  of Paying Living Expenses: Not hard at all  Food Insecurity: No Food Insecurity (08/01/2022)   Hunger Vital Sign    Worried About Running Out of Food in the Last Year: Never true    Ran Out of Food in the Last Year: Never true  Transportation Needs: No Transportation Needs (08/01/2022)   PRAPARE - Administrator, Civil Service (Medical): No    Lack of Transportation (Non-Medical): No  Physical Activity: Inactive (04/23/2021)   Exercise Vital Sign    Days of Exercise per Week: 0 days    Minutes of Exercise per Session: 0 min  Stress: Stress Concern Present (04/23/2021)   [employer] of Occupational Health - Occupational Stress Questionnaire    Feeling of Stress : Rather much  Social Connections: Moderately Integrated (12/28/2020)   Social Connection and Isolation Panel [NHANES]    Frequency of Communication with Friends and Family: More than three times a week    Frequency of Social Gatherings with Friends and Family: Once a week    Attends Religious Services: More than 4 times per year    Active Member of [employer] or Organizations: No    Attends Banker Meetings: Never    Marital Status: Married  Catering manager Violence: Not At Risk (07/26/2022)   Humiliation, Afraid, Rape, and Kick questionnaire    Fear of Current or Ex-Partner: No    Emotionally Abused: No    Physically Abused: No    Sexually Abused: No    Family History  Problem Relation Age of Onset   Hypertension Mother    Stroke Mother    Colon cancer Neg Hx    Anesthesia problems Neg Hx    Hypotension Neg Hx    Malignant hyperthermia Neg Hx    Pseudochol deficiency Neg Hx    Gastric cancer Neg Hx    Esophageal cancer Neg Hx  BP (!) 97/54   Pulse (!) 50   Ht 5\' 5"  (1.651 m)   Wt 117 lb (53.1 kg)   BMI 19.47 kg/m   Body mass index is 19.47 kg/m.      Objective:   Physical Exam Vitals and nursing note reviewed. Exam conducted with a chaperone present.  Constitutional:       Appearance: She is well-developed.  HENT:     Head: Normocephalic and atraumatic.  Eyes:     Extraocular Movements: EOM normal.     Conjunctiva/sclera: Conjunctivae normal.     Pupils: Pupils are equal, round, and reactive to light.  Cardiovascular:     Rate and Rhythm: Normal rate and regular rhythm.  Pulmonary:     Effort: Pulmonary effort is normal.  Abdominal:     Palpations: Abdomen is soft.  Musculoskeletal:     Cervical back: Normal range of motion and neck supple.       Legs:  Skin:    General: Skin is warm and dry.  Neurological:     Mental Status: She is alert and oriented to person, place, and time.     Cranial Nerves: No cranial nerve deficit.     Motor: No abnormal muscle tone.     Coordination: Coordination normal.     Deep Tendon Reflexes: Reflexes are normal and symmetric. Reflexes normal.  Psychiatric:        Mood and Affect: Mood and affect normal.        Behavior: Behavior normal.        Thought Content: Thought content normal.        Judgment: Judgment normal.   XRays were done of the right knee, reported separately.        Assessment & Plan:   Encounter Diagnosis  Name Primary?   Chronic pain of right knee Yes   PROCEDURE NOTE:  The patient request injection, verbal consent was obtained.  The right knee was prepped appropriately after time out was performed.   Sterile technique was observed and anesthesia was provided by ethyl chloride and a 20-gauge needle was used to inject the knee area.  A 16-gauge needle was then used to aspirate the knee.  Color of fluid aspirated was straw  Total cc's aspirated was 45.    Injection of 1 cc of DepoMedrol 40 mg with several cc's of plain xylocaine was then performed.  A band aid dressing was applied.  The patient was advised to apply ice later today and tomorrow to the injection sight as needed.   Return in three weeks.  Call if any problem.  Precautions discussed.  Electronically  Signed Darreld Mclean, MD 9/10/202411:30 AM

## 2022-10-18 NOTE — Telephone Encounter (Signed)
Called patient and told her to put ice on her knee

## 2022-10-18 NOTE — Telephone Encounter (Signed)
Dr. Sanjuan Dame pt - pt lvm, was seen this morning, had some fluid drawn off her knee.  She stated her knee is swelling and she wasn't given any instructions as what to do.  She'd like a call back [phone]

## 2022-10-19 ENCOUNTER — Telehealth: Payer: Self-pay | Admitting: *Deleted

## 2022-10-19 DIAGNOSIS — C946 Myelodysplastic disease, not classified: Secondary | ICD-10-CM | POA: Diagnosis not present

## 2022-10-19 DIAGNOSIS — D630 Anemia in neoplastic disease: Secondary | ICD-10-CM | POA: Diagnosis not present

## 2022-10-19 DIAGNOSIS — N179 Acute kidney failure, unspecified: Secondary | ICD-10-CM | POA: Diagnosis not present

## 2022-10-19 DIAGNOSIS — I129 Hypertensive chronic kidney disease with stage 1 through stage 4 chronic kidney disease, or unspecified chronic kidney disease: Secondary | ICD-10-CM | POA: Diagnosis not present

## 2022-10-19 DIAGNOSIS — N184 Chronic kidney disease, stage 4 (severe): Secondary | ICD-10-CM | POA: Diagnosis not present

## 2022-10-19 DIAGNOSIS — D631 Anemia in chronic kidney disease: Secondary | ICD-10-CM | POA: Diagnosis not present

## 2022-10-19 NOTE — Telephone Encounter (Signed)
Pt needs a call once Dr. Luvenia Starch Oct schedule is available for an appointment with him. Thanks!

## 2022-10-25 ENCOUNTER — Other Ambulatory Visit: Payer: Self-pay

## 2022-10-25 ENCOUNTER — Other Ambulatory Visit: Payer: Self-pay | Admitting: Family Medicine

## 2022-10-25 ENCOUNTER — Other Ambulatory Visit (HOSPITAL_COMMUNITY): Payer: Self-pay

## 2022-10-25 DIAGNOSIS — D471 Chronic myeloproliferative disease: Secondary | ICD-10-CM

## 2022-10-25 MED ORDER — ADULT MULTIVITAMIN W/MINERALS CH
1.0000 | ORAL_TABLET | Freq: Every day | ORAL | 1 refills | Status: AC
  Filled 2022-10-25: qty 100, 100d supply, fill #0
  Filled 2023-02-24: qty 130, 90d supply, fill #0
  Filled 2023-02-27: qty 130, 130d supply, fill #0

## 2022-10-26 ENCOUNTER — Encounter: Payer: Self-pay | Admitting: Family Medicine

## 2022-10-26 DIAGNOSIS — C946 Myelodysplastic disease, not classified: Secondary | ICD-10-CM | POA: Diagnosis not present

## 2022-10-26 DIAGNOSIS — D630 Anemia in neoplastic disease: Secondary | ICD-10-CM | POA: Diagnosis not present

## 2022-10-26 DIAGNOSIS — D631 Anemia in chronic kidney disease: Secondary | ICD-10-CM | POA: Diagnosis not present

## 2022-10-26 DIAGNOSIS — N184 Chronic kidney disease, stage 4 (severe): Secondary | ICD-10-CM | POA: Diagnosis not present

## 2022-10-26 DIAGNOSIS — I129 Hypertensive chronic kidney disease with stage 1 through stage 4 chronic kidney disease, or unspecified chronic kidney disease: Secondary | ICD-10-CM | POA: Diagnosis not present

## 2022-10-26 DIAGNOSIS — N179 Acute kidney failure, unspecified: Secondary | ICD-10-CM | POA: Diagnosis not present

## 2022-10-27 ENCOUNTER — Other Ambulatory Visit: Payer: Self-pay

## 2022-10-27 MED ORDER — ACYCLOVIR 800 MG PO TABS: 800.0000 mg | ORAL_TABLET | Freq: Every day | ORAL | 0 refills | Status: DC | PRN

## 2022-10-31 DIAGNOSIS — D631 Anemia in chronic kidney disease: Secondary | ICD-10-CM | POA: Diagnosis not present

## 2022-10-31 DIAGNOSIS — D630 Anemia in neoplastic disease: Secondary | ICD-10-CM | POA: Diagnosis not present

## 2022-10-31 DIAGNOSIS — I129 Hypertensive chronic kidney disease with stage 1 through stage 4 chronic kidney disease, or unspecified chronic kidney disease: Secondary | ICD-10-CM | POA: Diagnosis not present

## 2022-10-31 DIAGNOSIS — N179 Acute kidney failure, unspecified: Secondary | ICD-10-CM | POA: Diagnosis not present

## 2022-10-31 DIAGNOSIS — N184 Chronic kidney disease, stage 4 (severe): Secondary | ICD-10-CM | POA: Diagnosis not present

## 2022-10-31 DIAGNOSIS — C946 Myelodysplastic disease, not classified: Secondary | ICD-10-CM | POA: Diagnosis not present

## 2022-11-01 ENCOUNTER — Inpatient Hospital Stay: Payer: Medicare Other | Attending: Hematology

## 2022-11-01 DIAGNOSIS — D471 Chronic myeloproliferative disease: Secondary | ICD-10-CM | POA: Diagnosis not present

## 2022-11-01 DIAGNOSIS — D649 Anemia, unspecified: Secondary | ICD-10-CM

## 2022-11-01 DIAGNOSIS — R161 Splenomegaly, not elsewhere classified: Secondary | ICD-10-CM

## 2022-11-01 LAB — CBC
HCT: 24.2 % — ABNORMAL LOW (ref 36.0–46.0)
Hemoglobin: 7.9 g/dL — ABNORMAL LOW (ref 12.0–15.0)
MCH: 32.5 pg (ref 26.0–34.0)
MCHC: 32.6 g/dL (ref 30.0–36.0)
MCV: 99.6 fL (ref 80.0–100.0)
Platelets: 158 10*3/uL (ref 150–400)
RBC: 2.43 MIL/uL — ABNORMAL LOW (ref 3.87–5.11)
RDW: 18.8 % — ABNORMAL HIGH (ref 11.5–15.5)
WBC: 21.3 10*3/uL — ABNORMAL HIGH (ref 4.0–10.5)
nRBC: 2.5 % — ABNORMAL HIGH (ref 0.0–0.2)

## 2022-11-01 LAB — SAMPLE TO BLOOD BANK

## 2022-11-01 MED ORDER — SODIUM CHLORIDE 0.9% FLUSH
10.0000 mL | Freq: Once | INTRAVENOUS | Status: AC
  Administered 2022-11-01: 10 mL

## 2022-11-01 MED ORDER — HEPARIN SOD (PORK) LOCK FLUSH 100 UNIT/ML IV SOLN
500.0000 [IU] | Freq: Once | INTRAVENOUS | Status: AC
  Administered 2022-11-01: 500 [IU] via INTRAVENOUS

## 2022-11-02 ENCOUNTER — Other Ambulatory Visit: Payer: Self-pay | Admitting: Internal Medicine

## 2022-11-04 ENCOUNTER — Other Ambulatory Visit (HOSPITAL_COMMUNITY): Payer: Self-pay

## 2022-11-04 ENCOUNTER — Other Ambulatory Visit: Payer: Self-pay | Admitting: Internal Medicine

## 2022-11-04 ENCOUNTER — Other Ambulatory Visit: Payer: Self-pay | Admitting: Hematology

## 2022-11-07 ENCOUNTER — Other Ambulatory Visit: Payer: Self-pay | Admitting: *Deleted

## 2022-11-07 ENCOUNTER — Other Ambulatory Visit (HOSPITAL_COMMUNITY): Payer: Self-pay

## 2022-11-08 ENCOUNTER — Encounter: Payer: Self-pay | Admitting: Internal Medicine

## 2022-11-08 ENCOUNTER — Ambulatory Visit: Payer: Medicare Other | Admitting: Internal Medicine

## 2022-11-08 ENCOUNTER — Ambulatory Visit: Payer: Medicare Other | Admitting: Orthopaedic Surgery

## 2022-11-08 VITALS — BP 130/72 | HR 59 | Temp 98.8°F | Ht 65.0 in | Wt 118.6 lb

## 2022-11-08 DIAGNOSIS — K5903 Drug induced constipation: Secondary | ICD-10-CM

## 2022-11-08 DIAGNOSIS — K2270 Barrett's esophagus without dysplasia: Secondary | ICD-10-CM | POA: Diagnosis not present

## 2022-11-08 MED ORDER — NALOXEGOL OXALATE 25 MG PO TABS: 25.0000 mg | ORAL_TABLET | Freq: Every day | ORAL | 11 refills | Status: DC

## 2022-11-08 NOTE — Progress Notes (Signed)
Primary Care Physician:  Kerri Perches, MD Primary Gastroenterologist:  Dr. Jena Gauss  Pre-Procedure History & Physical: HPI:  Renee Waters is a 77 y.o. female here for follow-up of GERD/2 cm segment of Barrett's epithelium without dysplasia 2021.  Small adenoma removed from the colon 2017; due for surveillance now.  Since last being seen she developed myelodysplastic syndrome/neoplasm.  On antineoplastic therapy through Dr. Marice Potter office (see med list).  Doing well.  States she is on hospice.  Reflux well-controlled on pantoprazole twice daily.  She wants a refill.  No dysphagia.  Chronic constipation on regular opioids required for back and leg pain daily Metamucil does not work well.  States Amitiza does not work well either.  MiraLAX does not work well.  Has not had any bleeding.  Past Medical History:  Diagnosis Date   Acute cholangitis    Acute respiratory failure with hypoxia (HCC) 09/01/2021   Allergy    Anemia    Anxiety    Arthritis    Phreesia 03/18/2020   Barrett's esophagus    Cataract    Chronic back pain    Chronic neck pain    CKD (chronic kidney disease) stage 3, GFR 30-59 ml/min (HCC) 10/29/2020   Depression    Genital herpes    GERD (gastroesophageal reflux disease)    H/O degenerative disc disease    History of blood transfusion    Hypertension    Hypoxia 12/01/2021   Insomnia    Lupus (systemic lupus erythematosus) (HCC)    Neuromuscular disorder (HCC)    Osteoarthritis    S/P colonoscopy 07/2003   normal, no polyps   S/P endoscopy June 2005, Oct 2009   2005: short-segment Barrett's, 2009: short-segment Barrett's   Upper respiratory tract infection 07/09/2020   UTI (lower urinary tract infection) 11/2012    Past Surgical History:  Procedure Laterality Date   ABDOMINAL HYSTERECTOMY     BACK SURGERY     BIOPSY N/A 03/20/2014   Procedure: BIOPSY;  Surgeon: Corbin Ade, MD;  Location: [facility];  Service: Endoscopy;  Laterality: N/A;    BIOPSY  09/14/2015   Procedure: BIOPSY;  Surgeon: Corbin Ade, MD;  Location: [facility];  Service: Endoscopy;;  esophageal and gastric   BIOPSY  07/23/2019   Procedure: BIOPSY;  Surgeon: Corbin Ade, MD;  Location: [facility];  Service: Endoscopy;;  esophageal    CARPAL TUNNEL RELEASE Left 2013   cervical disectomy  2002   CESAREAN SECTION N/A    Phreesia 03/18/2020   CHOLECYSTECTOMY     with lysis of adhesions for sbo; "ruptured gallbladder".   COLONOSCOPY  11/09/2011   RMR: Melanosis coli   COLONOSCOPY WITH PROPOFOL N/A 09/14/2015   Dr. Jena Gauss: diverticulosis, 3mm TA removed. next TCS 09/2020.    COLONOSCOPY WITH PROPOFOL N/A 04/30/2020   Procedure: COLONOSCOPY WITH PROPOFOL;  Surgeon: Corbin Ade, MD;  Location: [facility];  Service: Endoscopy;  Laterality: N/A;  PM (ASA 3)   DENTAL SURGERY  11/2015   multiple tooth extraction   ESOPHAGOGASTRODUODENOSCOPY  11/29/2007   salmon-colored  tongue   longest stable at  3 cm, distal esophagus as described previously status post biopsy/ Hiatal hernia, otherwise normal stomach D1 and D2   ESOPHAGOGASTRODUODENOSCOPY  01/06/11   short segment Barrett's esophagus s/p bx/Hiatal hernia   ESOPHAGOGASTRODUODENOSCOPY (EGD) WITH PROPOFOL N/A 03/20/2014   [id number] distal esophagus short segment barrett's, bx with no dysplasia. next egd in 03/2017   ESOPHAGOGASTRODUODENOSCOPY (  EGD) WITH PROPOFOL N/A 09/14/2015   Dr. Jena Gauss: Barrett's without dysplasia, gastritis benign bx, hiatal hernia. next EGD 09/2018.   ESOPHAGOGASTRODUODENOSCOPY (EGD) WITH PROPOFOL N/A 07/23/2019   Procedure: ESOPHAGOGASTRODUODENOSCOPY (EGD) WITH PROPOFOL;  Surgeon: Corbin Ade, MD;  Location: [facility];  Service: Endoscopy;  Laterality: N/A;  3:00pm   EYE SURGERY N/A    Phreesia 03/18/2020   HERNIA REPAIR Right 07/2010   Dr. Arlean Hopping   IR IMAGING GUIDED PORT INSERTION  02/09/2021   JOINT REPLACEMENT     LAPAROSCOPIC CHOLECYSTECTOMY  2017   at [facility]    POLYPECTOMY  09/14/2015   Procedure: POLYPECTOMY;  Surgeon: Corbin Ade, MD;  Location: [facility];  Service: Endoscopy;;  ascending colon   right hip replacement  07/2010   went back in sept 2012 to fix   SHOULDER ARTHROSCOPY  2008   left   SPINE SURGERY N/A    Phreesia 03/18/2020   TOTAL HIP REVISION Right 12/17/2012   Procedure: RIGHT TOTAL HIP REVISION;  Surgeon: [name], MD;  Location: [facility];  Service: Orthopedics;  Laterality: Right;   WRIST SURGERY Right 2011   open reduction right wrist.    Prior to Admission medications   Medication Sig Start Date End Date Taking? Authorizing Provider  acyclovir (ZOVIRAX) 800 MG tablet Take 1 tablet (800 mg total) by mouth 5 (five) times daily as needed. 10/27/22  Yes Kerri Perches, MD  allopurinol (ZYLOPRIM) 100 MG tablet Take 2 tablets by mouth once daily 10/17/22  Yes [name], MD  cetirizine (ZYRTEC) 10 MG tablet Take 10 mg by mouth daily.   Yes [provider]  cholecalciferol (CHOLECALCIFEROL) 25 MCG tablet Take 1 tablet (1,000 Units total) by mouth daily. 07/24/22  Yes Tat, Onalee Hua, MD  EPINEPHrine 0.3 mg/0.3 mL IJ SOAJ injection INJECT 0.3 MLS INTO MUSCLE ONCE AS NEEDED 01/11/22  Yes Gardenia Phlegm, MD  hydrOXYzine (ATARAX) 50 MG tablet Take 1 tablet (50 mg total) by mouth 3 (three) times daily as needed. 07/11/22  Yes [name], MD  lidocaine-prilocaine (EMLA) cream Apply 1 Application topically as needed (Access to port and pain). 11/15/21  Yes [name], MD  metoprolol tartrate (LOPRESSOR) 25 MG tablet Take 0.5 tablets (12.5 mg total) by mouth 2 (two) times daily. 09/07/22  Yes Mallipeddi, Vishnu P, MD  mirtazapine (REMERON) 15 MG tablet Take 1 tablet (15 mg total) by mouth at bedtime. 08/30/22  Yes Kerri Perches, MD  momelotinib dihydrochloride (OJJAARA) 100 MG tablet Take 1 tablet (100 mg total) by mouth daily. 08/22/22  Yes [name], MD  Multiple Vitamin (MULTIVITAMIN  WITH MINERALS) TABS tablet Take 1 tablet by mouth daily. 10/25/22  Yes Kerri Perches, MD  ondansetron (ZOFRAN-ODT) 8 MG disintegrating tablet Take 1 tablet (8 mg total) by mouth every 8 (eight) hours as needed for nausea or vomiting. 03/01/22  Yes [name], MD  oxyCODONE (ROXICODONE) 15 MG immediate release tablet Take 15 mg by mouth every 6 (six) hours as needed. 01/19/22  Yes [provider]  pantoprazole (PROTONIX) 40 MG tablet Take 40 mg by mouth 2 (two) times daily before a meal.   Yes [provider]  potassium chloride (KLOR-CON) 10 MEQ tablet Take 1 tablet by mouth once daily 11/07/22  Yes [name], MD  prochlorperazine (COMPAZINE) 10 MG tablet Take 1 tablet (10 mg total) by mouth every 6 (six) hours as needed for nausea or vomiting. 01/04/22  Yes [name], MD  trimethoprim (TRIMPEX) 100 MG tablet Take 1 tablet (100 mg total) by mouth daily. 08/30/22  Yes Donnita Falls, FNP    Allergies as of 11/08/2022 - Review Complete 11/08/2022  Allergen Reaction Noted   Bee venom Swelling and Hives 06/12/2012   Norvasc [amlodipine] Other (See Comments) 09/16/2019   Tylenol [acetaminophen] Itching 09/16/2019   Hydralazine Other (See Comments) 01/07/2022   Red dye #40 (allura red) Itching 10/19/2021   Percocet [oxycodone-acetaminophen] Rash and Other (See Comments) 01/26/2015   Tyloxapol Itching and Rash 12/10/2018    Family History  Problem Relation Age of Onset   Hypertension Mother    Stroke Mother    Colon cancer Neg Hx    Anesthesia problems Neg Hx    Hypotension Neg Hx    Malignant hyperthermia Neg Hx    Pseudochol deficiency Neg Hx    Gastric cancer Neg Hx    Esophageal cancer Neg Hx     Social History   Socioeconomic History   Marital status: Married    Spouse name: louis   Number of children: 4   Years of education: 12+   Highest education level: Some college, no degree  Occupational History   Occupation:  Audiological scientist  - retired   Occupation: non profit  Tobacco Use   Smoking status: Former    Current packs/day: 0.00    Average packs/day: 0.3 packs/day for 25.0 years (6.3 ttl pk-yrs)    Types: Cigarettes    Start date: 02/06/1978    Quit date: 02/07/2003    Years since quitting: 19.7   Smokeless tobacco: Never   Tobacco comments:    quit in 2004  Vaping Use   Vaping status: Never Used  Substance and Sexual Activity   Alcohol use: No   Drug use: No   Sexual activity: Yes    Birth control/protection: Surgical  Other Topics Concern   Not on file  Social History Narrative   Not on file   Social Determinants of Health   Financial Resource Strain: Low Risk  (12/28/2020)   Overall Financial Resource Strain (CARDIA)    Difficulty of Paying Living Expenses: Not hard at all  Food Insecurity: No Food Insecurity (08/01/2022)   Hunger Vital Sign    Worried About Running Out of Food in the Last Year: Never true    Ran Out of Food in the Last Year: Never true  Transportation Needs: No Transportation Needs (08/01/2022)   PRAPARE - Administrator, Civil Service (Medical): No    Lack of Transportation (Non-Medical): No  Physical Activity: Inactive (04/23/2021)   Exercise Vital Sign    Days of Exercise per Week: 0 days    Minutes of Exercise per Session: 0 min  Stress: Stress Concern Present (04/23/2021)   [employer] of Occupational Health - Occupational Stress Questionnaire    Feeling of Stress : Rather much  Social Connections: Moderately Integrated (12/28/2020)   Social Connection and Isolation Panel [NHANES]    Frequency of Communication with Friends and Family: More than three times a week    Frequency of Social Gatherings with Friends and Family: Once a week    Attends Religious Services: More than 4 times per year    Active Member of [employer] or Organizations: No    Attends Banker Meetings: Never    Marital Status: Married  Catering manager Violence: Not  At Risk (07/26/2022)   Humiliation, Afraid, Rape, and Kick questionnaire    Fear of Current or Ex-Partner:  No    Emotionally Abused: No    Physically Abused: No    Sexually Abused: No    Review of Systems: See HPI, otherwise negative ROS  Physical Exam: BP 130/72 (BP Location: Left Arm, Patient Position: Sitting, Cuff Size: Normal)   Pulse (!) 59   Temp 98.8 F (37.1 C) (Oral)   Ht 5\' 5"  (1.651 m)   Wt 118 lb 9.6 oz (53.8 kg)   SpO2 98%   BMI 19.74 kg/m  General:   Alert,  Well-developed, well-nourished, pleasant and cooperative in NAD; accompanied by her husband. Abdomen: Non-distended, normal bowel sounds.  Soft and nontender without appreciable mass or hepatosplenomegaly.   Impression/Plan: 77 year old lady with longstanding GERD complicated by short segment nondysplastic Barrett's on prior EGD now with opioid-induced constipation.  History of small adenoma removed 2017.  Constipation inadequately managed at this time.  Unfortunately, patient has mild dysplastic syndrome/neoplasm on antineoplastic therapy and is now on hospice.  I discussed the utility of a surveillance EGD and colonoscopy.  In this clinical setting, I feel that we could defer endoscopic surveillance at this time.  She has no alarm symptoms.  This was discussed at length with her and her husband.  Recommendations:  We will refill the pantoprazole Protonix take 40 mg twice daily 30 minutes for breakfast and supper (dispense 60 with 11 refills)  Continue Metamucil daily for bowel function.  Begin Movantik 25 mg 1 tablet daily to manage constipation while on opioid therapy.  We will call in a new prescription to her pharmacy (dispense 30 with 11 refills).  Samples provided.  As discussed, we talked about a follow-up colonoscopy and an upper endoscopy given history of polyps and Barrett's esophagus.  However,  with everything else going on with her health at this time, I think we can hold off on that approach as  discussed.  Office visit with me in 3 months.    Notice: This dictation was prepared with Dragon dictation along with smaller phrase technology. Any transcriptional errors that result from this process are unintentional and may not be corrected upon review.

## 2022-11-08 NOTE — Patient Instructions (Signed)
It was good to see you again today!  We will refill the pantoprazole Protonix take 40 mg twice daily 30 minutes for breakfast and supper (dispense 60 with 11 refills)  Continue Metamucil daily for bowel function.  Begin Movantik 25 mg 1 tablet daily to manage constipation while on opioid therapy.  We will call in a new prescription to your pharmacy (dispense 30 with 11 refills).  Samples provided.  As discussed, we talked about a follow-up colonoscopy and an upper endoscopy given your history of polyps and Barrett's esophagus.  However,  with everything else going on with your health at this time, I think we can hold off on that approach as discussed.  Office visit with me in 3 months.

## 2022-11-09 DIAGNOSIS — D630 Anemia in neoplastic disease: Secondary | ICD-10-CM | POA: Diagnosis not present

## 2022-11-09 DIAGNOSIS — N184 Chronic kidney disease, stage 4 (severe): Secondary | ICD-10-CM | POA: Diagnosis not present

## 2022-11-09 DIAGNOSIS — N179 Acute kidney failure, unspecified: Secondary | ICD-10-CM | POA: Diagnosis not present

## 2022-11-09 DIAGNOSIS — D631 Anemia in chronic kidney disease: Secondary | ICD-10-CM | POA: Diagnosis not present

## 2022-11-09 DIAGNOSIS — C946 Myelodysplastic disease, not classified: Secondary | ICD-10-CM | POA: Diagnosis not present

## 2022-11-09 DIAGNOSIS — I129 Hypertensive chronic kidney disease with stage 1 through stage 4 chronic kidney disease, or unspecified chronic kidney disease: Secondary | ICD-10-CM | POA: Diagnosis not present

## 2022-11-10 ENCOUNTER — Other Ambulatory Visit: Payer: Self-pay

## 2022-11-10 DIAGNOSIS — G4700 Insomnia, unspecified: Secondary | ICD-10-CM | POA: Diagnosis not present

## 2022-11-10 DIAGNOSIS — M1990 Unspecified osteoarthritis, unspecified site: Secondary | ICD-10-CM | POA: Diagnosis not present

## 2022-11-10 DIAGNOSIS — I129 Hypertensive chronic kidney disease with stage 1 through stage 4 chronic kidney disease, or unspecified chronic kidney disease: Secondary | ICD-10-CM | POA: Diagnosis not present

## 2022-11-10 DIAGNOSIS — N184 Chronic kidney disease, stage 4 (severe): Secondary | ICD-10-CM | POA: Diagnosis not present

## 2022-11-10 DIAGNOSIS — C946 Myelodysplastic disease, not classified: Secondary | ICD-10-CM | POA: Diagnosis not present

## 2022-11-10 DIAGNOSIS — D630 Anemia in neoplastic disease: Secondary | ICD-10-CM | POA: Diagnosis not present

## 2022-11-10 DIAGNOSIS — M329 Systemic lupus erythematosus, unspecified: Secondary | ICD-10-CM | POA: Diagnosis not present

## 2022-11-10 DIAGNOSIS — M519 Unspecified thoracic, thoracolumbar and lumbosacral intervertebral disc disorder: Secondary | ICD-10-CM | POA: Diagnosis not present

## 2022-11-10 DIAGNOSIS — F32A Depression, unspecified: Secondary | ICD-10-CM | POA: Diagnosis not present

## 2022-11-10 DIAGNOSIS — Z87440 Personal history of urinary (tract) infections: Secondary | ICD-10-CM | POA: Diagnosis not present

## 2022-11-10 DIAGNOSIS — G8929 Other chronic pain: Secondary | ICD-10-CM | POA: Diagnosis not present

## 2022-11-10 DIAGNOSIS — K2270 Barrett's esophagus without dysplasia: Secondary | ICD-10-CM | POA: Diagnosis not present

## 2022-11-10 DIAGNOSIS — Z556 Problems related to health literacy: Secondary | ICD-10-CM | POA: Diagnosis not present

## 2022-11-10 DIAGNOSIS — Z9181 History of falling: Secondary | ICD-10-CM | POA: Diagnosis not present

## 2022-11-10 DIAGNOSIS — F419 Anxiety disorder, unspecified: Secondary | ICD-10-CM | POA: Diagnosis not present

## 2022-11-10 DIAGNOSIS — M542 Cervicalgia: Secondary | ICD-10-CM | POA: Diagnosis not present

## 2022-11-10 DIAGNOSIS — D631 Anemia in chronic kidney disease: Secondary | ICD-10-CM | POA: Diagnosis not present

## 2022-11-10 DIAGNOSIS — Z87891 Personal history of nicotine dependence: Secondary | ICD-10-CM | POA: Diagnosis not present

## 2022-11-10 DIAGNOSIS — N179 Acute kidney failure, unspecified: Secondary | ICD-10-CM | POA: Diagnosis not present

## 2022-11-10 DIAGNOSIS — E876 Hypokalemia: Secondary | ICD-10-CM | POA: Diagnosis not present

## 2022-11-14 ENCOUNTER — Other Ambulatory Visit: Payer: Self-pay

## 2022-11-14 ENCOUNTER — Other Ambulatory Visit (HOSPITAL_COMMUNITY): Payer: Self-pay

## 2022-11-14 DIAGNOSIS — Z515 Encounter for palliative care: Secondary | ICD-10-CM | POA: Diagnosis not present

## 2022-11-14 DIAGNOSIS — D47Z9 Other specified neoplasms of uncertain behavior of lymphoid, hematopoietic and related tissue: Secondary | ICD-10-CM | POA: Diagnosis not present

## 2022-11-14 MED ORDER — PANTOPRAZOLE SODIUM 40 MG PO TBEC: 40.0000 mg | DELAYED_RELEASE_TABLET | Freq: Two times a day (BID) | ORAL | 11 refills | Status: DC

## 2022-11-14 NOTE — Progress Notes (Signed)
Specialty Pharmacy Refill Coordination Note  Renee Waters is a 77 y.o. female contacted today regarding refills of specialty medication(s) Momelotinib Dihydrochloride   Patient requested Delivery   Delivery date: 11/18/22   Verified address: [address]  [place] [address]   Medication will be filled on 11/17/22.

## 2022-11-15 ENCOUNTER — Encounter: Payer: Self-pay | Admitting: Orthopaedic Surgery

## 2022-11-15 ENCOUNTER — Ambulatory Visit (INDEPENDENT_AMBULATORY_CARE_PROVIDER_SITE_OTHER): Payer: Medicare Other | Admitting: Orthopaedic Surgery

## 2022-11-15 ENCOUNTER — Other Ambulatory Visit: Payer: Self-pay | Admitting: Hematology

## 2022-11-15 VITALS — BP 96/62 | HR 68 | Ht 65.0 in | Wt 118.0 lb

## 2022-11-15 DIAGNOSIS — G8929 Other chronic pain: Secondary | ICD-10-CM

## 2022-11-15 DIAGNOSIS — M25561 Pain in right knee: Secondary | ICD-10-CM | POA: Diagnosis not present

## 2022-11-15 NOTE — Progress Notes (Signed)
I am better.  Her right knee has much less pain.  She has some swelling but not near as much as before the injection. She uses a cane on the left hand but she is walking much better.  She has no new trauma, she has no edema.  Right knee has slight effusion, crepitus, ROM 0 to 105, stable, NV intact, no distal edema.  Encounter Diagnosis  Name Primary?   Chronic pain of right knee Yes   I will see as needed.  Call if any problem.  Precautions discussed.  Electronically Signed Darreld Mclean, MD 10/8/20242:09 PM

## 2022-11-16 ENCOUNTER — Other Ambulatory Visit: Payer: Self-pay | Admitting: Hematology

## 2022-11-17 DIAGNOSIS — D630 Anemia in neoplastic disease: Secondary | ICD-10-CM | POA: Diagnosis not present

## 2022-11-17 DIAGNOSIS — N184 Chronic kidney disease, stage 4 (severe): Secondary | ICD-10-CM | POA: Diagnosis not present

## 2022-11-17 DIAGNOSIS — I129 Hypertensive chronic kidney disease with stage 1 through stage 4 chronic kidney disease, or unspecified chronic kidney disease: Secondary | ICD-10-CM | POA: Diagnosis not present

## 2022-11-17 DIAGNOSIS — D631 Anemia in chronic kidney disease: Secondary | ICD-10-CM | POA: Diagnosis not present

## 2022-11-17 DIAGNOSIS — C946 Myelodysplastic disease, not classified: Secondary | ICD-10-CM | POA: Diagnosis not present

## 2022-11-17 DIAGNOSIS — N179 Acute kidney failure, unspecified: Secondary | ICD-10-CM | POA: Diagnosis not present

## 2022-11-23 ENCOUNTER — Telehealth: Payer: Self-pay | Admitting: Family Medicine

## 2022-11-23 DIAGNOSIS — C946 Myelodysplastic disease, not classified: Secondary | ICD-10-CM | POA: Diagnosis not present

## 2022-11-23 DIAGNOSIS — I129 Hypertensive chronic kidney disease with stage 1 through stage 4 chronic kidney disease, or unspecified chronic kidney disease: Secondary | ICD-10-CM | POA: Diagnosis not present

## 2022-11-23 DIAGNOSIS — N184 Chronic kidney disease, stage 4 (severe): Secondary | ICD-10-CM | POA: Diagnosis not present

## 2022-11-23 DIAGNOSIS — D631 Anemia in chronic kidney disease: Secondary | ICD-10-CM | POA: Diagnosis not present

## 2022-11-23 DIAGNOSIS — N179 Acute kidney failure, unspecified: Secondary | ICD-10-CM | POA: Diagnosis not present

## 2022-11-23 DIAGNOSIS — D630 Anemia in neoplastic disease: Secondary | ICD-10-CM | POA: Diagnosis not present

## 2022-11-23 NOTE — Telephone Encounter (Signed)
Christine RN with [facility] called in on patient behalf   Wants a call back to discuss patients insomnia and pain   Call back number [phone]

## 2022-11-23 NOTE — Telephone Encounter (Signed)
Left a message for nurse to call me back no answert when I clled

## 2022-11-28 ENCOUNTER — Inpatient Hospital Stay: Payer: Medicare Other

## 2022-11-28 ENCOUNTER — Inpatient Hospital Stay: Payer: Medicare Other | Attending: Hematology | Admitting: Hematology

## 2022-11-28 VITALS — BP 167/81 | HR 58 | Temp 98.1°F | Resp 18 | Wt 117.2 lb

## 2022-11-28 DIAGNOSIS — N183 Chronic kidney disease, stage 3 unspecified: Secondary | ICD-10-CM | POA: Diagnosis not present

## 2022-11-28 DIAGNOSIS — D649 Anemia, unspecified: Secondary | ICD-10-CM

## 2022-11-28 DIAGNOSIS — Z79624 Long term (current) use of inhibitors of nucleotide synthesis: Secondary | ICD-10-CM | POA: Diagnosis not present

## 2022-11-28 DIAGNOSIS — E876 Hypokalemia: Secondary | ICD-10-CM | POA: Diagnosis not present

## 2022-11-28 DIAGNOSIS — L299 Pruritus, unspecified: Secondary | ICD-10-CM | POA: Diagnosis not present

## 2022-11-28 DIAGNOSIS — Z79899 Other long term (current) drug therapy: Secondary | ICD-10-CM | POA: Insufficient documentation

## 2022-11-28 DIAGNOSIS — D471 Chronic myeloproliferative disease: Secondary | ICD-10-CM

## 2022-11-28 DIAGNOSIS — Z87891 Personal history of nicotine dependence: Secondary | ICD-10-CM | POA: Diagnosis not present

## 2022-11-28 DIAGNOSIS — R161 Splenomegaly, not elsewhere classified: Secondary | ICD-10-CM

## 2022-11-28 LAB — CBC WITH DIFFERENTIAL/PLATELET
Abs Immature Granulocytes: 1.4 10*3/uL — ABNORMAL HIGH (ref 0.00–0.07)
Band Neutrophils: 2 %
Basophils Absolute: 0 10*3/uL (ref 0.0–0.1)
Basophils Relative: 0 %
Eosinophils Absolute: 0 10*3/uL (ref 0.0–0.5)
Eosinophils Relative: 0 %
HCT: 25.8 % — ABNORMAL LOW (ref 36.0–46.0)
Hemoglobin: 8.2 g/dL — ABNORMAL LOW (ref 12.0–15.0)
Lymphocytes Relative: 13 %
Lymphs Abs: 2.9 10*3/uL (ref 0.7–4.0)
MCH: 31.8 pg (ref 26.0–34.0)
MCHC: 31.8 g/dL (ref 30.0–36.0)
MCV: 100 fL (ref 80.0–100.0)
Metamyelocytes Relative: 6 %
Monocytes Absolute: 0.7 10*3/uL (ref 0.1–1.0)
Monocytes Relative: 3 %
Neutro Abs: 17.6 10*3/uL — ABNORMAL HIGH (ref 1.7–7.7)
Neutrophils Relative %: 76 %
Platelets: 206 10*3/uL (ref 150–400)
RBC: 2.58 MIL/uL — ABNORMAL LOW (ref 3.87–5.11)
RDW: 19.7 % — ABNORMAL HIGH (ref 11.5–15.5)
WBC: 22.6 10*3/uL — ABNORMAL HIGH (ref 4.0–10.5)
nRBC: 3 % — ABNORMAL HIGH (ref 0.0–0.2)
nRBC: 3 /100{WBCs} — ABNORMAL HIGH

## 2022-11-28 LAB — COMPREHENSIVE METABOLIC PANEL
ALT: 20 U/L (ref 0–44)
AST: 22 U/L (ref 15–41)
Albumin: 3.9 g/dL (ref 3.5–5.0)
Alkaline Phosphatase: 96 U/L (ref 38–126)
Anion gap: 5 (ref 5–15)
BUN: 31 mg/dL — ABNORMAL HIGH (ref 8–23)
CO2: 22 mmol/L (ref 22–32)
Calcium: 8.3 mg/dL — ABNORMAL LOW (ref 8.9–10.3)
Chloride: 109 mmol/L (ref 98–111)
Creatinine, Ser: 1.62 mg/dL — ABNORMAL HIGH (ref 0.44–1.00)
GFR, Estimated: 33 mL/min — ABNORMAL LOW (ref >60)
Glucose, Bld: 122 mg/dL — ABNORMAL HIGH (ref 70–99)
Potassium: 3.9 mmol/L (ref 3.5–5.1)
Sodium: 136 mmol/L (ref 135–145)
Total Bilirubin: 0.5 mg/dL (ref 0.3–1.2)
Total Protein: 7.9 g/dL (ref 6.5–8.1)

## 2022-11-28 LAB — SAMPLE TO BLOOD BANK

## 2022-11-28 MED ORDER — HEPARIN SOD (PORK) LOCK FLUSH 100 UNIT/ML IV SOLN
500.0000 [IU] | Freq: Once | INTRAVENOUS | Status: AC
  Administered 2022-11-28: 500 [IU] via INTRAVENOUS

## 2022-11-28 MED ORDER — SODIUM CHLORIDE 0.9% FLUSH
10.0000 mL | INTRAVENOUS | Status: DC | PRN
  Administered 2022-11-28: 10 mL via INTRAVENOUS

## 2022-11-28 NOTE — Patient Instructions (Addendum)
[facility] at [facility] Discharge Instructions   You were seen and examined today by Dr. Ellin Saba.  He reviewed the results of your lab work which are normal/stable. Your spleen size is stable.   Continue Ojjaara as prescribed.   We will see you back in 2 months. We will repeat lab work in 1 month and at your visit.   Return as scheduled.    Thank you for choosing  [facility] at [facility] to provide your oncology and hematology care.  To afford each patient quality time with our provider, please arrive at least 15 minutes before your scheduled appointment time.   If you have a lab appointment with the Cancer Center please come in thru the Main Entrance and check in at the main information desk.  You need to re-schedule your appointment should you arrive 10 or more minutes late.  We strive to give you quality time with our providers, and arriving late affects you and other patients whose appointments are after yours.  Also, if you no show three or more times for appointments you may be dismissed from the clinic at the providers discretion.     Again, thank you for choosing [facility].  Our hope is that these requests will decrease the amount of time that you wait before being seen by our physicians.       _____________________________________________________________  Should you have questions after your visit to [facility], please contact our office at [phone] and follow the prompts.  Our office hours are 8:00 a.m. and 4:30 p.m. Monday - Friday.  Please note that voicemails left after 4:00 p.m. may not be returned until the following business day.  We are closed weekends and major holidays.  You do have access to a nurse 24-7, just call the main number to the clinic [phone] and do not press any options, hold on the line and a nurse will answer the phone.    For prescription refill requests, have your  pharmacy contact our office and allow 72 hours.    Due to Covid, you will need to wear a mask upon entering the hospital. If you do not have a mask, a mask will be given to you at the Main Entrance upon arrival. For doctor visits, patients may have 1 support person age 89 or older with them. For treatment visits, patients can not have anyone with them due to social distancing guidelines and our immunocompromised population.

## 2022-11-28 NOTE — Progress Notes (Signed)
Patient is taking Ojjaara as prescribed.  She has not missed any doses and reports no side effects at this time.   

## 2022-11-28 NOTE — Progress Notes (Signed)
[facility] [address] [place] [address]    Clinic Day:  11/28/2022  Referring physician: Kerri Perches, MD  Patient Care Team: Kerri Perches, MD as PCP - General Mallipeddi, Orion Modest, MD as PCP - Cardiology (Cardiology) Jena Gauss Gerrit Friends, MD (Gastroenterology) Vickki Hearing, MD as Consulting Physician (Orthopedic Surgery) [name], MD as Medical Oncologist (Oncology) Therese Sarah, RN as Oncology Nurse Navigator (Oncology) Clinton Gallant, RN as [facility] Care Management   ASSESSMENT & PLAN:   Assessment: 1. MPL positive prefibrotic/early primary myelofibrosis: - CBC on 11/06/2020 with white count 75.6, differential 59% neutrophils, 12% monocytes, 4% lymphocytes, 1 percentage of eosinophils and basophils, 6% band neutrophils, 12% myelocytes, 1% promyelocytes, 29% blasts. - Pathologist review of blood smear reported as leukoerythroblastic reaction. - 25 pound weight loss in the last 6 months, unintentional.  Decreased appetite.  Reports fatigue for the last few months.  Reports night sweats x3 in the last 1 month. - Bone marrow biopsy on 11/18/2020 consistent with granulocytic proliferation with differential including CMML versus myeloproliferative disorder. - BCR/ABL was negative. 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 - NGS myeloid panel shows mutations in ASXL1, MPL, TET2, EZH2 mutations. - Spleen ultrasound on 12/16/2020 shows mildly enlarged measuring 12.4 x 9.6 x 7 cm with volume of 435 cc. - PDGFR alpha, beta and FGFR 1 was negative. - She was evaluated by Dr. Lowell Guitar at [facility].  Slides were reviewed at [facility] hematopathology.  They thought it was less likely CMML and more likely MPL positive myeloproliferative neoplasm.  Dr. Lowell Guitar has recommended initiate Jakafi. - Given anemia, B symptoms, cytogenetics with MPL, ASLX1, normal karyotype she is intermediate  risk on GIPPS at high risk MIPSS70+ - Ruxolitinib 10 mg twice daily started on 02/16/2021.  Dose increased to 15 mg twice daily on 05/18/2021. - CTAP on 04/07/2021: Hepatomegaly and splenomegaly measuring 14.3 cm.  No other pathology.  No spleen infarcts. -Ruxolitinib dose increased to 15 mg twice daily on 09/20/2021. - Ultrasound spleen (09/21/2021): 13.6 cm with volume 532 mL.  This is slightly improved from previous volume. - Bone marrow biopsy (09/09/2021): Material is limited but overall features consistent with myeloid neoplasm.  No increase in blasts.  Erythroid precursors appear decreased but megakaryocytes are variably evident with clusters and small forms.  Reticulin's shows variable increase in reticulin fibers including areas with prominent increase.  Chromosome analysis was normal. - Tapering doses of ruxolitinib started on 12/24/2021, last dose on 01/23/2022 - Evaluated by Dr. Lowell Guitar on 12/24/2021. - Momelotinib 100 mg daily started on 01/26/2022, dose increased to 150 mg daily on 07/19/2022 due to generalized itching.  Dose decreased to 100 mg daily on 08/09/2022.  2.  Social/family history: - She lives at home and is able to do all her ADLs and IADLs although she is getting tired lately.  She reports quitting smoking 6 months ago and smoked 1 pack/week for 43 years. - She believes that her mother had some kind of leukemia.  No other malignancies.    Plan: MPL positive myeloproliferative neoplasm: - She is tolerating momelotinib 100 mg daily very well. - Reviewed labs today: WBC-22.6, hemoglobin-8.2, PLT-206.  Creatinine 1.62.  LFTs are stable. - Reviewed ultrasound spleen from 10/11/2022: Spleen measures 10.2 x 9.7 x 4.6 cm.  Volume is 240 cc.  Previous volume is 549 cc. - She has gotten very good response with momelotinib. - Continue momelotinib 100  mg daily.  RTC 2 months for follow-up.  Will repeat CBC in 1 month to see if she needs any transfusion.  2.  Decreased appetite: - She is  continuing to eat better and weight is stable.  3.  CKD: - Creatinine is 1.62 and stable.  4.  Hypokalemia: - Continue potassium daily.  Potassium is 3.9 today.   5.  Generalized itching: - Itching has improved since momelotinib started.    Orders Placed This Encounter  Procedures   CBC with Differential    Standing Status:   Future    Standing Expiration Date:   11/28/2023   Comprehensive metabolic panel    Standing Status:   Future    Standing Expiration Date:   11/28/2023   Lactate dehydrogenase    Standing Status:   Future    Standing Expiration Date:   11/28/2023      I,Katie Daubenspeck,acting as a scribe for [name], MD.,have documented all relevant documentation on the behalf of [name], MD,as directed by  [name], MD while in the presence of [name], MD.   I, [name] MD, have reviewed the above documentation for accuracy and completeness, and I agree with the above.   [name], MD   10/21/20245:05 PM  CHIEF COMPLAINT:   Diagnosis: MPL positive myeloproliferative neoplasm    Cancer Staging  No matching staging information was found for the patient.    Prior Therapy: Jakafi 10 mg twice daily   Current Therapy:  Momelotinib 100 mg daily    HISTORY OF PRESENT ILLNESS:   Oncology History   No history exists.     INTERVAL HISTORY:   Rianne is a 77 y.o. female presenting to clinic today for follow up of MPL positive myeloproliferative neoplasm. She was last seen by me on 10/04/22.  Since her last visit, she underwent repeat spleen US on 10/11/22 showing: 10.2 cm with 240 cc volume. This is improved from prior and is now considered WNL.  Today, she states that she is doing well overall. Her appetite level is at 75%. Her energy level is at 100%.  PAST MEDICAL HISTORY:   Past Medical History: Past Medical History:  Diagnosis Date   Acute cholangitis    Acute respiratory failure  with hypoxia (HCC) 09/01/2021   Allergy    Anemia    Anxiety    Arthritis    Phreesia 03/18/2020   Barrett's esophagus    Cataract    Chronic back pain    Chronic neck pain    CKD (chronic kidney disease) stage 3, GFR 30-59 ml/min (HCC) 10/29/2020   Depression    Genital herpes    GERD (gastroesophageal reflux disease)    H/O degenerative disc disease    History of blood transfusion    Hypertension    Hypoxia 12/01/2021   Insomnia    Lupus (systemic lupus erythematosus) (HCC)    Neuromuscular disorder (HCC)    Osteoarthritis    S/P colonoscopy 07/2003   normal, no polyps   S/P endoscopy June 2005, Oct 2009   2005: short-segment Barrett's, 2009: short-segment Barrett's   Upper respiratory tract infection 07/09/2020   UTI (lower urinary tract infection) 11/2012    Surgical History: Past Surgical History:  Procedure Laterality Date   ABDOMINAL HYSTERECTOMY     BACK SURGERY     BIOPSY N/A 03/20/2014   Procedure: BIOPSY;  Surgeon: Corbin Ade, MD;  Location: [facility];  Service: Endoscopy;  Laterality: N/A;   BIOPSY  09/14/2015   Procedure: BIOPSY;  Surgeon: Corbin Ade, MD;  Location: [facility];  Service: Endoscopy;;  esophageal and gastric   BIOPSY  07/23/2019   Procedure: BIOPSY;  Surgeon: Corbin Ade, MD;  Location: [facility];  Service: Endoscopy;;  esophageal    CARPAL TUNNEL RELEASE Left 2013   cervical disectomy  2002   CESAREAN SECTION N/A    Phreesia 03/18/2020   CHOLECYSTECTOMY     with lysis of adhesions for sbo; "ruptured gallbladder".   COLONOSCOPY  11/09/2011   RMR: Melanosis coli   COLONOSCOPY WITH PROPOFOL N/A 09/14/2015   Dr. Jena Gauss: diverticulosis, 3mm TA removed. next TCS 09/2020.    COLONOSCOPY WITH PROPOFOL N/A 04/30/2020   Procedure: COLONOSCOPY WITH PROPOFOL;  Surgeon: Corbin Ade, MD;  Location: [facility];  Service: Endoscopy;  Laterality: N/A;  PM (ASA 3)   DENTAL SURGERY  11/2015   multiple tooth extraction    ESOPHAGOGASTRODUODENOSCOPY  11/29/2007   salmon-colored  tongue   longest stable at  3 cm, distal esophagus as described previously status post biopsy/ Hiatal hernia, otherwise normal stomach D1 and D2   ESOPHAGOGASTRODUODENOSCOPY  01/06/11   short segment Barrett's esophagus s/p bx/Hiatal hernia   ESOPHAGOGASTRODUODENOSCOPY (EGD) WITH PROPOFOL N/A 03/20/2014   NWG:[id number] distal esophagus short segment barrett's, bx with no dysplasia. next egd in 03/2017   ESOPHAGOGASTRODUODENOSCOPY (EGD) WITH PROPOFOL N/A 09/14/2015   Dr. Jena Gauss: Barrett's without dysplasia, gastritis benign bx, hiatal hernia. next EGD 09/2018.   ESOPHAGOGASTRODUODENOSCOPY (EGD) WITH PROPOFOL N/A 07/23/2019   Procedure: ESOPHAGOGASTRODUODENOSCOPY (EGD) WITH PROPOFOL;  Surgeon: Corbin Ade, MD;  [facility];  Service: Endoscopy;  Laterality: N/A;  3:00pm   EYE SURGERY N/A    Phreesia 03/18/2020   HERNIA REPAIR Right 07/2010   Dr. Arlean Hopping   IR IMAGING GUIDED PORT INSERTION  02/09/2021   JOINT REPLACEMENT     LAPAROSCOPIC CHOLECYSTECTOMY  2017   at [facility]   POLYPECTOMY  09/14/2015   Procedure: POLYPECTOMY;  Surgeon: Corbin Ade, MD;  Location: [facility];  Service: Endoscopy;;  ascending colon   right hip replacement  07/2010   went back in sept 2012 to fix   SHOULDER ARTHROSCOPY  2008   left   SPINE SURGERY N/A    Phreesia 03/18/2020   TOTAL HIP REVISION Right 12/17/2012   Procedure: RIGHT TOTAL HIP REVISION;  Surgeon: [name], MD;  Location: [facility];  Service: Orthopedics;  Laterality: Right;   WRIST SURGERY Right 2011   open reduction right wrist.    Social History: Social History   Socioeconomic History   Marital status: Married    Spouse name: louis   Number of children: 4   Years of education: 12+   Highest education level: Some college, no degree  Occupational History   Occupation: Audiological scientist  - retired   Occupation: non profit  Tobacco Use   Smoking status: Former    Current  packs/day: 0.00    Average packs/day: 0.3 packs/day for 25.0 years (6.3 ttl pk-yrs)    Types: Cigarettes    Start date: 02/06/1978    Quit date: 02/07/2003    Years since quitting: 19.8   Smokeless tobacco: Never   Tobacco comments:    quit in 2004  Vaping Use   Vaping status: Never Used  Substance and Sexual Activity   Alcohol use: No   Drug use: No   Sexual activity: Yes    Birth control/protection: Surgical  Other Topics Concern   Not on file  Social History Narrative   Not on file   Social Determinants of Health   Financial Resource Strain: Low Risk  (12/28/2020)   Overall Financial Resource Strain (CARDIA)    Difficulty of Paying Living Expenses: Not hard at all  Food Insecurity: No Food Insecurity (08/01/2022)   Hunger Vital Sign    Worried About Running Out of Food in the Last Year: Never true    Ran Out of Food in the Last Year: Never true  Transportation Needs: No Transportation Needs (08/01/2022)   PRAPARE - Administrator, Civil Service (Medical): No    Lack of Transportation (Non-Medical): No  Physical Activity: Inactive (04/23/2021)   Exercise Vital Sign    Days of Exercise per Week: 0 days    Minutes of Exercise per Session: 0 min  Stress: Stress Concern Present (04/23/2021)   [employer] of Occupational Health - Occupational Stress Questionnaire    Feeling of Stress : Rather much  Social Connections: Moderately Integrated (12/28/2020)   Social Connection and Isolation Panel [NHANES]    Frequency of Communication with Friends and Family: More than three times a week    Frequency of Social Gatherings with Friends and Family: Once a week    Attends Religious Services: More than 4 times per year    Active Member of [employer] or Organizations: No    Attends Banker Meetings: Never    Marital Status: Married  Catering manager Violence: Not At Risk (07/26/2022)   Humiliation, Afraid, Rape, and Kick questionnaire    Fear of Current or  Ex-Partner: No    Emotionally Abused: No    Physically Abused: No    Sexually Abused: No    Family History: Family History  Problem Relation Age of Onset   Hypertension Mother    Stroke Mother    Colon cancer Neg Hx    Anesthesia problems Neg Hx    Hypotension Neg Hx    Malignant hyperthermia Neg Hx    Pseudochol deficiency Neg Hx    Gastric cancer Neg Hx    Esophageal cancer Neg Hx     Current Medications:  Current Outpatient Medications:    acyclovir (ZOVIRAX) 800 MG tablet, Take 1 tablet (800 mg total) by mouth 5 (five) times daily as needed., Disp: 30 tablet, Rfl: 0   allopurinol (ZYLOPRIM) 100 MG tablet, Take 2 tablets by mouth once daily, Disp: 60 tablet, Rfl: 0   cetirizine (ZYRTEC) 10 MG tablet, Take 10 mg by mouth daily., Disp: , Rfl:    cholecalciferol (CHOLECALCIFEROL) 25 MCG tablet, Take 1 tablet (1,000 Units total) by mouth daily., Disp: , Rfl:    EPINEPHrine 0.3 mg/0.3 mL IJ SOAJ injection, INJECT 0.3 MLS INTO MUSCLE ONCE AS NEEDED, Disp: 1 each, Rfl: 2   hydrOXYzine (ATARAX) 50 MG tablet, Take 1 tablet (50 mg total) by mouth 3 (three) times daily as needed., Disp: 90 tablet, Rfl: 1   lidocaine-prilocaine (EMLA) cream, Apply 1 Application topically as needed (Access to port and pain)., Disp: 30 g, Rfl: 6   metoprolol tartrate (LOPRESSOR) 25 MG tablet, Take 0.5 tablets (12.5 mg total) by mouth 2 (two) times daily., Disp: , Rfl:    mirtazapine (REMERON) 15 MG tablet, Take 1 tablet (15 mg total) by mouth at bedtime., Disp: 30 tablet, Rfl: 5   momelotinib dihydrochloride (OJJAARA) 100 MG tablet, Take 1 tablet (100 mg total) by mouth daily., Disp: 30 tablet, Rfl: 3  Multiple Vitamin (MULTIVITAMIN WITH MINERALS) TABS tablet, Take 1 tablet by mouth daily., Disp: 100 tablet, Rfl: 1   naloxegol oxalate (MOVANTIK) 25 MG TABS tablet, Take 1 tablet (25 mg total) by mouth daily., Disp: 30 tablet, Rfl: 11   ondansetron (ZOFRAN-ODT) 8 MG disintegrating tablet, Take 1 tablet (8 mg  total) by mouth every 8 (eight) hours as needed for nausea or vomiting., Disp: 20 tablet, Rfl: 0   oxyCODONE (ROXICODONE) 15 MG immediate release tablet, Take 15 mg by mouth every 6 (six) hours as needed., Disp: , Rfl:    pantoprazole (PROTONIX) 40 MG tablet, Take 1 tablet (40 mg total) by mouth 2 (two) times daily before a meal., Disp: 60 tablet, Rfl: 11   potassium chloride (KLOR-CON) 10 MEQ tablet, Take 1 tablet by mouth once daily, Disp: 30 tablet, Rfl: 0   prochlorperazine (COMPAZINE) 10 MG tablet, TAKE 1 TABLET BY MOUTH EVERY 6 HOURS AS NEEDED FOR NAUSEA OR VOMITING, Disp: 60 tablet, Rfl: 0   trimethoprim (TRIMPEX) 100 MG tablet, Take 1 tablet (100 mg total) by mouth daily., Disp: 30 tablet, Rfl: 11 No current facility-administered medications for this visit.  Facility-Administered Medications Ordered in Other Visits:    lanreotide acetate (SOMATULINE DEPOT) 120 MG/0.5ML injection, , , ,    lanreotide acetate (SOMATULINE DEPOT) 120 MG/0.5ML injection, , , ,    lanreotide acetate (SOMATULINE DEPOT) 120 MG/0.5ML injection, , , ,    octreotide (SANDOSTATIN LAR) 30 MG IM injection, , , ,    octreotide (SANDOSTATIN LAR) 30 MG IM injection, , , ,    Allergies: Allergies  Allergen Reactions   Bee Venom Swelling and Hives   Norvasc [Amlodipine] Other (See Comments)    Hair loss   Tylenol [Acetaminophen] Itching   Hydralazine Other (See Comments)    Hair loss   Red Dye #40 (Allura Red) Itching   Percocet [Oxycodone-Acetaminophen] Rash and Other (See Comments)    Pt states, "this gives her a rash, but at home she takes oxycodone for pain relief"    Tyloxapol Itching and Rash    REVIEW OF SYSTEMS:   Review of Systems  Constitutional:  Negative for chills, fatigue and fever.  HENT:   Negative for lump/mass, mouth sores, nosebleeds, sore throat and trouble swallowing.   Eyes:  Negative for eye problems.  Respiratory:  Negative for cough and shortness of breath.   Cardiovascular:   Negative for chest pain, leg swelling and palpitations.  Gastrointestinal:  Positive for constipation and nausea. Negative for abdominal pain, diarrhea and vomiting.  Genitourinary:  Negative for bladder incontinence, difficulty urinating, dysuria, frequency, hematuria and nocturia.   Musculoskeletal:  Negative for arthralgias, back pain, flank pain, myalgias and neck pain.  Skin:  Negative for itching and rash.  Neurological:  Positive for dizziness and numbness. Negative for headaches.  Hematological:  Does not bruise/bleed easily.  Psychiatric/Behavioral:  Positive for sleep disturbance. Negative for depression and suicidal ideas. The patient is not nervous/anxious.   All other systems reviewed and are negative.    VITALS:   Blood pressure (!) 167/81, pulse (!) 58, temperature 98.1 F (36.7 C), temperature source Oral, resp. rate 18, weight 117 lb 3.2 oz (53.2 kg), SpO2 100%.  Wt Readings from Last 3 Encounters:  11/28/22 117 lb 3.2 oz (53.2 kg)  11/15/22 118 lb (53.5 kg)  11/08/22 118 lb 9.6 oz (53.8 kg)    Body mass index is 19.5 kg/m.  Performance status (ECOG): 1 - Symptomatic but completely ambulatory  PHYSICAL EXAM:   Physical Exam Vitals and nursing note reviewed. Exam conducted with a chaperone present.  Constitutional:      Appearance: Normal appearance.  Cardiovascular:     Rate and Rhythm: Normal rate and regular rhythm.     Pulses: Normal pulses.     Heart sounds: Normal heart sounds.  Pulmonary:     Effort: Pulmonary effort is normal.     Breath sounds: Normal breath sounds.  Abdominal:     Palpations: Abdomen is soft. There is no hepatomegaly, splenomegaly or mass.     Tenderness: There is no abdominal tenderness.  Musculoskeletal:     Right lower leg: No edema.     Left lower leg: No edema.  Lymphadenopathy:     Cervical: No cervical adenopathy.     Right cervical: No superficial, deep or posterior cervical adenopathy.    Left cervical: No superficial,  deep or posterior cervical adenopathy.     Upper Body:     Right upper body: No supraclavicular or axillary adenopathy.     Left upper body: No supraclavicular or axillary adenopathy.  Neurological:     General: No focal deficit present.     Mental Status: She is alert and oriented to person, place, and time.  Psychiatric:        Mood and Affect: Mood normal.        Behavior: Behavior normal.     LABS:      Latest Ref Rng & Units 11/28/2022   10:02 AM 11/01/2022    9:48 AM 10/04/2022    2:48 PM  CBC  WBC 4.0 - 10.5 K/uL 22.6  21.3  30.0   Hemoglobin 12.0 - 15.0 g/dL 8.2  7.9  8.3   Hematocrit 36.0 - 46.0 % 25.8  24.2  25.7   Platelets 150 - 400 K/uL 206  158  187       Latest Ref Rng & Units 11/28/2022   10:02 AM 10/04/2022    2:48 PM 09/01/2022    1:04 PM  CMP  Glucose 70 - 99 mg/dL 244  010  272   BUN 8 - 23 mg/dL 31  28  34   Creatinine 0.44 - 1.00 mg/dL 5.36  6.44  0.34   Sodium 135 - 145 mmol/L 136  138  137   Potassium 3.5 - 5.1 mmol/L 3.9  4.0  3.9   Chloride 98 - 111 mmol/L 109  109  108   CO2 22 - 32 mmol/L 22  22  23    Calcium 8.9 - 10.3 mg/dL 8.3  8.7  8.4   Total Protein 6.5 - 8.1 g/dL 7.9  8.1  7.8   Total Bilirubin 0.3 - 1.2 mg/dL 0.5  0.7  0.8   Alkaline Phos 38 - 126 U/L 96  124  130   AST 15 - 41 U/L 22  26  30    ALT 0 - 44 U/L 20  21  18       No results found for: "CEA1", "CEA" / No results found for: "CEA1", "CEA" No results found for: "PSA1" No results found for: "VQQ595" No results found for: "CAN125"  No results found for: "TOTALPROTELP", "ALBUMINELP", "A1GS", "A2GS", "BETS", "BETA2SER", "GAMS", "MSPIKE", "SPEI" Lab Results  Component Value Date   TIBC 329 08/25/2015   TIBC 336 02/21/2011   FERRITIN 26 08/25/2015   FERRITIN 81 02/21/2011   IRONPCTSAT 8 (L) 08/25/2015   IRONPCTSAT 23 02/21/2011   Lab Results  Component  Value Date   LDH 588 (H) 07/19/2022   LDH 620 (H) 06/07/2022   LDH 832 (H) 04/21/2022     STUDIES:   No results  found.

## 2022-11-28 NOTE — Progress Notes (Signed)
Renee Waters presented for Portacath access and flush with labs. Portacath located right chest wall accessed with  H 20 needle. Good blood return present. Portacath flushed with 20ml NS and 500U/85ml Heparin and needle removed intact.  Procedure tolerated well and without incident.   Discharged from clinic ambulatory in stable condition. Alert and oriented x 3. F/U with [facility] as scheduled.

## 2022-12-01 ENCOUNTER — Other Ambulatory Visit: Payer: Self-pay | Admitting: Hematology

## 2022-12-01 ENCOUNTER — Encounter: Payer: Self-pay | Admitting: Family Medicine

## 2022-12-01 ENCOUNTER — Telehealth: Payer: Self-pay

## 2022-12-01 ENCOUNTER — Ambulatory Visit (INDEPENDENT_AMBULATORY_CARE_PROVIDER_SITE_OTHER): Payer: Medicare Other | Admitting: Family Medicine

## 2022-12-01 VITALS — BP 137/71 | HR 70 | Ht 65.0 in | Wt 114.1 lb

## 2022-12-01 DIAGNOSIS — D471 Chronic myeloproliferative disease: Secondary | ICD-10-CM | POA: Diagnosis not present

## 2022-12-01 DIAGNOSIS — G5793 Unspecified mononeuropathy of bilateral lower limbs: Secondary | ICD-10-CM

## 2022-12-01 DIAGNOSIS — R2681 Unsteadiness on feet: Secondary | ICD-10-CM

## 2022-12-01 DIAGNOSIS — F418 Other specified anxiety disorders: Secondary | ICD-10-CM | POA: Diagnosis not present

## 2022-12-01 DIAGNOSIS — F5105 Insomnia due to other mental disorder: Secondary | ICD-10-CM

## 2022-12-01 DIAGNOSIS — Z7409 Other reduced mobility: Secondary | ICD-10-CM

## 2022-12-01 DIAGNOSIS — I10 Essential (primary) hypertension: Secondary | ICD-10-CM | POA: Diagnosis not present

## 2022-12-01 MED ORDER — PREGABALIN 25 MG PO CAPS: 25.0000 mg | ORAL_CAPSULE | Freq: Every day | ORAL | 3 refills | Status: DC

## 2022-12-01 MED ORDER — TEMAZEPAM 30 MG PO CAPS: 30.0000 mg | ORAL_CAPSULE | Freq: Every day | ORAL | 5 refills | Status: DC

## 2022-12-01 NOTE — Patient Instructions (Addendum)
F/u in 10 weeks, call if you need me sooner  New for neuropathy is lyrica 25 mg daily  Resume temzepam for sleep and we will stop mirtazapine    We will send for bedside commode  Nurse to check on oxygen situation, states being billed though oxygen ahs been stopped and is no longer being supplied  Thanks for choosing [facility], we consider it a privelige to serve you.

## 2022-12-01 NOTE — Telephone Encounter (Signed)
Patient informed and verbalized understanding of plan. 

## 2022-12-01 NOTE — Telephone Encounter (Signed)
-----   Message from Vishnu P Mallipeddi sent at 11/25/2022 11:48 AM EDT ----- Event monitor showed 2.4% PAC burden, symptomatic. Average HR 61 bpm, will not make any changes to metoprolol at this time. Will see her in the clinic in 12/2022.

## 2022-12-02 DIAGNOSIS — N179 Acute kidney failure, unspecified: Secondary | ICD-10-CM | POA: Diagnosis not present

## 2022-12-02 DIAGNOSIS — D630 Anemia in neoplastic disease: Secondary | ICD-10-CM | POA: Diagnosis not present

## 2022-12-02 DIAGNOSIS — C946 Myelodysplastic disease, not classified: Secondary | ICD-10-CM | POA: Diagnosis not present

## 2022-12-02 DIAGNOSIS — D631 Anemia in chronic kidney disease: Secondary | ICD-10-CM | POA: Diagnosis not present

## 2022-12-02 DIAGNOSIS — N184 Chronic kidney disease, stage 4 (severe): Secondary | ICD-10-CM | POA: Diagnosis not present

## 2022-12-02 DIAGNOSIS — I129 Hypertensive chronic kidney disease with stage 1 through stage 4 chronic kidney disease, or unspecified chronic kidney disease: Secondary | ICD-10-CM | POA: Diagnosis not present

## 2022-12-04 DIAGNOSIS — G5793 Unspecified mononeuropathy of bilateral lower limbs: Secondary | ICD-10-CM | POA: Insufficient documentation

## 2022-12-04 NOTE — Assessment & Plan Note (Signed)
Controlled, no change in medication  

## 2022-12-04 NOTE — Assessment & Plan Note (Addendum)
No response to remeron ,resume temazepam, which she has responded to very well in the past Sleep hygiene reviewed and written information offered also. Prescription sent for  medication needed.

## 2022-12-04 NOTE — Assessment & Plan Note (Signed)
Decreased mobility, unsteady gait with increased risk of falls, bedside commode for safety

## 2022-12-04 NOTE — Assessment & Plan Note (Signed)
Trial of low dose lyrica, 25 mg daily

## 2022-12-04 NOTE — Assessment & Plan Note (Signed)
Currently great response to chemo, managed by oncology, recent decrease to normal size of spleen reported and WBC remains stable and relatively low

## 2022-12-04 NOTE — Assessment & Plan Note (Signed)
Requires assistive device for safety, will benefit from and needs bedside commode

## 2022-12-04 NOTE — Progress Notes (Signed)
Renee Waters     MRN: [id number]      DOB: 1945/04/23  Chief Complaint  Patient presents with   Follow-up    Follow up, trouble sleeping, covid vaccine, nervous tick or neuropathy, requesting bedside commode.      HPI Renee Waters is here for follow up and re-evaluation of chronic medical conditions, medication management and review of any available recent lab and radiology data.  Preventive health is updated, specifically  Cancer screening and Immunization.   Generally feeling better as blood count has remained stable and WBC remains low. Tolerating her meds C/o poor sleep since changing sleep med at last visit, no improvement in appetite as was hoped and instead poor sleep C/o neuropathic pain and states also has a tic when  her anxiety is uncontrolled   Reports excessive urination wesp at night, has chronic back pain and limitation in mobility, requires a cane for safe ambulation, requests bedside commode   ROS Denies recent fever or chills. Denies sinus pressure, nasal congestion, ear pain or sore throat. Denies chest congestion, productive cough or wheezing. Denies chest pains, palpitations and leg swelling Denies abdominal pain, nausea, vomiting,diarrhea or constipation.   Denies dysuria, frequency, hesitancy or incontinence.  Denies skin break down or rash.   PE  BP 137/71 (BP Location: Right Arm, Patient Position: Sitting, Cuff Size: Large)   Pulse 70   Ht 5\' 5"  (1.651 m)   Wt 114 lb 1.9 oz (51.8 kg)   SpO2 97%   BMI 18.99 kg/m   Patient alert and oriented and in no cardiopulmonary distress.  HEENT: No facial asymmetry, EOMI,     Neck supple .  Chest: Clear to auscultation bilaterally.  CVS: S1, S2 no murmurs, no S3.Regular rate.  ABD: distended  Ext: No edema  [id number]  ROM spine,  adequate in shoulders, hips and knees.  Skin: Intact, no ulcerations or rash noted.  Psych: Good eye contact, normal affect. Memory intact not anxious or depressed  appearing.  CNS: CN 2-12 intact, power,  normal throughout.no focal deficits noted.   Assessment & Plan  Insomnia secondary to depression with anxiety No response to remeron ,resume temazepam, which she has responded to very well in the past Sleep hygiene reviewed and written information offered also. Prescription sent for  medication needed.   Neuropathic pain of both feet Trial of low dose lyrica, 25 mg daily  MPN (myeloproliferative neoplasm) (HCC) Currently great response to chemo, managed by oncology, recent decrease to normal size of spleen reported and WBC remains stable and relatively low  Unsteady gait Decreased mobility, unsteady gait with increased risk of falls, bedside commode for safety   Impaired mobility Requires assistive device for safety, will benefit from and needs bedside commode   Essential hypertension Controlled, no change in medication

## 2022-12-07 DIAGNOSIS — C946 Myelodysplastic disease, not classified: Secondary | ICD-10-CM | POA: Diagnosis not present

## 2022-12-07 DIAGNOSIS — N184 Chronic kidney disease, stage 4 (severe): Secondary | ICD-10-CM | POA: Diagnosis not present

## 2022-12-07 DIAGNOSIS — N179 Acute kidney failure, unspecified: Secondary | ICD-10-CM | POA: Diagnosis not present

## 2022-12-07 DIAGNOSIS — D631 Anemia in chronic kidney disease: Secondary | ICD-10-CM | POA: Diagnosis not present

## 2022-12-07 DIAGNOSIS — I129 Hypertensive chronic kidney disease with stage 1 through stage 4 chronic kidney disease, or unspecified chronic kidney disease: Secondary | ICD-10-CM | POA: Diagnosis not present

## 2022-12-07 DIAGNOSIS — D630 Anemia in neoplastic disease: Secondary | ICD-10-CM | POA: Diagnosis not present

## 2022-12-10 DIAGNOSIS — Z87891 Personal history of nicotine dependence: Secondary | ICD-10-CM | POA: Diagnosis not present

## 2022-12-10 DIAGNOSIS — M542 Cervicalgia: Secondary | ICD-10-CM | POA: Diagnosis not present

## 2022-12-10 DIAGNOSIS — F419 Anxiety disorder, unspecified: Secondary | ICD-10-CM | POA: Diagnosis not present

## 2022-12-10 DIAGNOSIS — Z9181 History of falling: Secondary | ICD-10-CM | POA: Diagnosis not present

## 2022-12-10 DIAGNOSIS — Z87440 Personal history of urinary (tract) infections: Secondary | ICD-10-CM | POA: Diagnosis not present

## 2022-12-10 DIAGNOSIS — D630 Anemia in neoplastic disease: Secondary | ICD-10-CM | POA: Diagnosis not present

## 2022-12-10 DIAGNOSIS — N184 Chronic kidney disease, stage 4 (severe): Secondary | ICD-10-CM | POA: Diagnosis not present

## 2022-12-10 DIAGNOSIS — I700 Atherosclerosis of aorta: Secondary | ICD-10-CM | POA: Diagnosis not present

## 2022-12-10 DIAGNOSIS — N179 Acute kidney failure, unspecified: Secondary | ICD-10-CM | POA: Diagnosis not present

## 2022-12-10 DIAGNOSIS — M1990 Unspecified osteoarthritis, unspecified site: Secondary | ICD-10-CM | POA: Diagnosis not present

## 2022-12-10 DIAGNOSIS — G4700 Insomnia, unspecified: Secondary | ICD-10-CM | POA: Diagnosis not present

## 2022-12-10 DIAGNOSIS — F32A Depression, unspecified: Secondary | ICD-10-CM | POA: Diagnosis not present

## 2022-12-10 DIAGNOSIS — C946 Myelodysplastic disease, not classified: Secondary | ICD-10-CM | POA: Diagnosis not present

## 2022-12-10 DIAGNOSIS — M329 Systemic lupus erythematosus, unspecified: Secondary | ICD-10-CM | POA: Diagnosis not present

## 2022-12-10 DIAGNOSIS — K2270 Barrett's esophagus without dysplasia: Secondary | ICD-10-CM | POA: Diagnosis not present

## 2022-12-10 DIAGNOSIS — J439 Emphysema, unspecified: Secondary | ICD-10-CM | POA: Diagnosis not present

## 2022-12-10 DIAGNOSIS — M519 Unspecified thoracic, thoracolumbar and lumbosacral intervertebral disc disorder: Secondary | ICD-10-CM | POA: Diagnosis not present

## 2022-12-10 DIAGNOSIS — D631 Anemia in chronic kidney disease: Secondary | ICD-10-CM | POA: Diagnosis not present

## 2022-12-10 DIAGNOSIS — I129 Hypertensive chronic kidney disease with stage 1 through stage 4 chronic kidney disease, or unspecified chronic kidney disease: Secondary | ICD-10-CM | POA: Diagnosis not present

## 2022-12-10 DIAGNOSIS — Z556 Problems related to health literacy: Secondary | ICD-10-CM | POA: Diagnosis not present

## 2022-12-10 DIAGNOSIS — G8929 Other chronic pain: Secondary | ICD-10-CM | POA: Diagnosis not present

## 2022-12-12 ENCOUNTER — Other Ambulatory Visit: Payer: Self-pay

## 2022-12-12 DIAGNOSIS — C946 Myelodysplastic disease, not classified: Secondary | ICD-10-CM | POA: Diagnosis not present

## 2022-12-12 DIAGNOSIS — N179 Acute kidney failure, unspecified: Secondary | ICD-10-CM | POA: Diagnosis not present

## 2022-12-12 DIAGNOSIS — D631 Anemia in chronic kidney disease: Secondary | ICD-10-CM | POA: Diagnosis not present

## 2022-12-12 DIAGNOSIS — I129 Hypertensive chronic kidney disease with stage 1 through stage 4 chronic kidney disease, or unspecified chronic kidney disease: Secondary | ICD-10-CM | POA: Diagnosis not present

## 2022-12-12 DIAGNOSIS — D630 Anemia in neoplastic disease: Secondary | ICD-10-CM | POA: Diagnosis not present

## 2022-12-12 DIAGNOSIS — N184 Chronic kidney disease, stage 4 (severe): Secondary | ICD-10-CM | POA: Diagnosis not present

## 2022-12-13 DIAGNOSIS — D47Z9 Other specified neoplasms of uncertain behavior of lymphoid, hematopoietic and related tissue: Secondary | ICD-10-CM | POA: Diagnosis not present

## 2022-12-13 DIAGNOSIS — Z515 Encounter for palliative care: Secondary | ICD-10-CM | POA: Diagnosis not present

## 2022-12-13 DIAGNOSIS — L089 Local infection of the skin and subcutaneous tissue, unspecified: Secondary | ICD-10-CM | POA: Diagnosis not present

## 2022-12-13 DIAGNOSIS — M79641 Pain in right hand: Secondary | ICD-10-CM | POA: Diagnosis not present

## 2022-12-13 DIAGNOSIS — S60221A Contusion of right hand, initial encounter: Secondary | ICD-10-CM | POA: Diagnosis not present

## 2022-12-14 ENCOUNTER — Other Ambulatory Visit: Payer: Self-pay | Admitting: Hematology

## 2022-12-14 ENCOUNTER — Other Ambulatory Visit: Payer: Self-pay

## 2022-12-15 DIAGNOSIS — L309 Dermatitis, unspecified: Secondary | ICD-10-CM | POA: Diagnosis not present

## 2022-12-15 DIAGNOSIS — D485 Neoplasm of uncertain behavior of skin: Secondary | ICD-10-CM | POA: Diagnosis not present

## 2022-12-15 DIAGNOSIS — L920 Granuloma annulare: Secondary | ICD-10-CM | POA: Diagnosis not present

## 2022-12-19 ENCOUNTER — Ambulatory Visit: Payer: Medicare Other | Attending: Internal Medicine | Admitting: Internal Medicine

## 2022-12-19 ENCOUNTER — Encounter: Payer: Self-pay | Admitting: Internal Medicine

## 2022-12-19 VITALS — BP 118/60 | HR 72 | Ht 65.0 in | Wt 121.8 lb

## 2022-12-19 DIAGNOSIS — I491 Atrial premature depolarization: Secondary | ICD-10-CM

## 2022-12-19 DIAGNOSIS — I351 Nonrheumatic aortic (valve) insufficiency: Secondary | ICD-10-CM | POA: Diagnosis not present

## 2022-12-19 NOTE — Patient Instructions (Addendum)
Medication Instructions:  Your physician recommends that you continue on your current medications as directed. Please refer to the Current Medication list given to you today.   Labwork: None  Testing/Procedures: Your physician has requested that you have an echocardiogram before one year follow up. Echocardiography is a painless test that uses sound waves to create images of your heart. It provides your doctor with information about the size and shape of your heart and how well your heart's chambers and valves are working. This procedure takes approximately one hour. There are no restrictions for this procedure. Please do NOT wear cologne, perfume, aftershave, or lotions (deodorant is allowed). Please arrive 15 minutes prior to your appointment time.  Please note: We ask at that you not bring children with you during ultrasound (echo/ vascular) testing. Due to room size and safety concerns, children are not allowed in the ultrasound rooms during exams. Our front office staff cannot provide observation of children in our lobby area while testing is being conducted. An adult accompanying a patient to their appointment will only be allowed in the ultrasound room at the discretion of the ultrasound technician under special circumstances. We apologize for any inconvenience.   Follow-Up: Your physician recommends that you schedule a follow-up appointment in: 1 year. You will receive a reminder call in about 8 months reminding you to schedule your appointment. If you don't receive this call, please contact our office.   Any Other Special Instructions Will Be Listed Below (If Applicable). Thank you for choosing  [facility]!     If you need a refill on your cardiac medications before your next appointment, please call your pharmacy.

## 2022-12-19 NOTE — Progress Notes (Signed)
Cardiology Office Note  Date: 12/19/2022   ID: Renee Waters, DOB [birth year], MRN [id number]  PCP:  Kerri Perches, MD  Cardiologist:  Marjo Bicker, MD Electrophysiologist:  None   History of Present Illness: Renee Waters is a 77 y.o. female known to have labile HTN, mild to moderate AI, chronic diastolic heart failure is here for follow-up visit.  Overall doing great, no angina, DOE, orthopnea, PND, dizziness, syncope.  Home BPs range between 120 and 130 millimeter mercury SBP.  Denies having palpitations.  Event monitor showed 2.4% PAC burden, however she denies having any palpitations.  No leg swelling.  Compliant with medications, dizziness significantly improved/resolved after decreasing dose of metoprolol tartrate from 25 mg to 12.5 mg twice daily.  Past Medical History:  Diagnosis Date   Acute cholangitis    Acute respiratory failure with hypoxia (HCC) 09/01/2021   Allergy    Anemia    Anxiety    Arthritis    Phreesia 03/18/2020   Barrett's esophagus    Cataract    Chronic back pain    Chronic neck pain    CKD (chronic kidney disease) stage 3, GFR 30-59 ml/min (HCC) 10/29/2020   Depression    Genital herpes    GERD (gastroesophageal reflux disease)    H/O degenerative disc disease    History of blood transfusion    Hypertension    Hypoxia 12/01/2021   Insomnia    Lupus (systemic lupus erythematosus) (HCC)    Neuromuscular disorder (HCC)    Osteoarthritis    S/P colonoscopy 07/2003   normal, no polyps   S/P endoscopy June 2005, Oct 2009   2005: short-segment Barrett's, 2009: short-segment Barrett's   Upper respiratory tract infection 07/09/2020   UTI (lower urinary tract infection) 11/2012    Past Surgical History:  Procedure Laterality Date   ABDOMINAL HYSTERECTOMY     BACK SURGERY     BIOPSY N/A 03/20/2014   Procedure: BIOPSY;  Surgeon: Corbin Ade, MD;  Location: [facility];  Service: Endoscopy;  Laterality: N/A;   BIOPSY  09/14/2015    Procedure: BIOPSY;  Surgeon: Corbin Ade, MD;  [facility];  Service: Endoscopy;;  esophageal and gastric   BIOPSY  07/23/2019   Procedure: BIOPSY;  Surgeon: Corbin Ade, MD;  [facility];  Service: Endoscopy;;  esophageal    CARPAL TUNNEL RELEASE Left 2013   cervical disectomy  2002   CESAREAN SECTION N/A    Phreesia 03/18/2020   CHOLECYSTECTOMY     with lysis of adhesions for sbo; "ruptured gallbladder".   COLONOSCOPY  11/09/2011   RMR: Melanosis coli   COLONOSCOPY WITH PROPOFOL N/A 09/14/2015   Dr. Jena Gauss: diverticulosis, 3mm TA removed. next TCS 09/2020.    COLONOSCOPY WITH PROPOFOL N/A 04/30/2020   Procedure: COLONOSCOPY WITH PROPOFOL;  Surgeon: Corbin Ade, MD;  [facility];  Service: Endoscopy;  Laterality: N/A;  PM (ASA 3)   DENTAL SURGERY  11/2015   multiple tooth extraction   ESOPHAGOGASTRODUODENOSCOPY  11/29/2007   salmon-colored  tongue   longest stable at  3 cm, distal esophagus as described previously status post biopsy/ Hiatal hernia, otherwise normal stomach D1 and D2   ESOPHAGOGASTRODUODENOSCOPY  01/06/11   short segment Barrett's esophagus s/p bx/Hiatal hernia   ESOPHAGOGASTRODUODENOSCOPY (EGD) WITH PROPOFOL N/A 03/20/2014   [id number] distal esophagus short segment barrett's, bx with no dysplasia. next egd in 03/2017   ESOPHAGOGASTRODUODENOSCOPY (EGD) WITH PROPOFOL N/A 09/14/2015  Dr. Jena Gauss: Barrett's without dysplasia, gastritis benign bx, hiatal hernia. next EGD 09/2018.   ESOPHAGOGASTRODUODENOSCOPY (EGD) WITH PROPOFOL N/A 07/23/2019   Procedure: ESOPHAGOGASTRODUODENOSCOPY (EGD) WITH PROPOFOL;  Surgeon: Corbin Ade, MD;  [facility];  Service: Endoscopy;  Laterality: N/A;  3:00pm   EYE SURGERY N/A    Phreesia 03/18/2020   HERNIA REPAIR Right 07/2010   Dr. Arlean Hopping   IR IMAGING GUIDED PORT INSERTION  02/09/2021   JOINT REPLACEMENT     LAPAROSCOPIC CHOLECYSTECTOMY  2017   at [facility]   POLYPECTOMY  09/14/2015    Procedure: POLYPECTOMY;  Surgeon: Corbin Ade, MD;  [facility];  Service: Endoscopy;;  ascending colon   right hip replacement  07/2010   went back in sept 2012 to fix   SHOULDER ARTHROSCOPY  2008   left   SPINE SURGERY N/A    Phreesia 03/18/2020   TOTAL HIP REVISION Right 12/17/2012   Procedure: RIGHT TOTAL HIP REVISION;  Surgeon: [name], MD;  Location: [facility];  Service: Orthopedics;  Laterality: Right;   WRIST SURGERY Right 2011   open reduction right wrist.    Current Outpatient Medications  Medication Sig Dispense Refill   acyclovir (ZOVIRAX) 800 MG tablet Take 1 tablet (800 mg total) by mouth 5 (five) times daily as needed. 30 tablet 0   allopurinol (ZYLOPRIM) 100 MG tablet Take 2 tablets by mouth once daily 60 tablet 0   cetirizine (ZYRTEC) 10 MG tablet Take 10 mg by mouth daily.     cholecalciferol (CHOLECALCIFEROL) 25 MCG tablet Take 1 tablet (1,000 Units total) by mouth daily.     clindamycin (CLEOCIN) 300 MG capsule Take 300 mg by mouth 3 (three) times daily.     EPINEPHrine 0.3 mg/0.3 mL IJ SOAJ injection INJECT 0.3 MLS INTO MUSCLE ONCE AS NEEDED 1 each 2   hydrOXYzine (ATARAX) 50 MG tablet Take 1 tablet by mouth three times daily as needed 90 tablet 0   lidocaine-prilocaine (EMLA) cream Apply 1 Application topically as needed (Access to port and pain). 30 g 6   metoprolol tartrate (LOPRESSOR) 25 MG tablet Take 0.5 tablets (12.5 mg total) by mouth 2 (two) times daily.     mirtazapine (REMERON) 15 MG tablet Take 15 mg by mouth at bedtime.     momelotinib dihydrochloride (OJJAARA) 100 MG tablet Take 1 tablet (100 mg total) by mouth daily. 30 tablet 3   Multiple Vitamin (MULTIVITAMIN WITH MINERALS) TABS tablet Take 1 tablet by mouth daily. 100 tablet 1   naloxegol oxalate (MOVANTIK) 25 MG TABS tablet Take 1 tablet (25 mg total) by mouth daily. 30 tablet 11   ondansetron (ZOFRAN-ODT) 8 MG disintegrating tablet Take 1 tablet (8 mg total) by mouth every 8  (eight) hours as needed for nausea or vomiting. 20 tablet 0   oxyCODONE (ROXICODONE) 15 MG immediate release tablet Take 15 mg by mouth every 6 (six) hours as needed.     pantoprazole (PROTONIX) 40 MG tablet Take 1 tablet (40 mg total) by mouth 2 (two) times daily before a meal. 60 tablet 11   potassium chloride (KLOR-CON) 10 MEQ tablet Take 1 tablet by mouth once daily 30 tablet 0   pregabalin (LYRICA) 25 MG capsule Take 1 capsule (25 mg total) by mouth daily. 30 capsule 3   prochlorperazine (COMPAZINE) 10 MG tablet TAKE 1 TABLET BY MOUTH EVERY 6 HOURS AS NEEDED FOR NAUSEA OR VOMITING 60 tablet 0   temazepam (RESTORIL) 30 MG capsule  Take 1 capsule (30 mg total) by mouth at bedtime. 30 capsule 5   trimethoprim (TRIMPEX) 100 MG tablet Take 1 tablet (100 mg total) by mouth daily. 30 tablet 11   No current facility-administered medications for this visit.   Facility-Administered Medications Ordered in Other Visits  Medication Dose Route Frequency Provider Last Rate Last Admin   lanreotide acetate (SOMATULINE DEPOT) 120 MG/0.5ML injection            lanreotide acetate (SOMATULINE DEPOT) 120 MG/0.5ML injection            lanreotide acetate (SOMATULINE DEPOT) 120 MG/0.5ML injection            octreotide (SANDOSTATIN LAR) 30 MG IM injection            octreotide (SANDOSTATIN LAR) 30 MG IM injection            Allergies:  Bee venom, Norvasc [amlodipine], Tylenol [acetaminophen], Hydralazine, Red dye #40 (allura red), Percocet [oxycodone-acetaminophen], and Tyloxapol   Social History: The patient  reports that she quit smoking about 19 years ago. Her smoking use included cigarettes. She started smoking about 44 years ago. She has a 6.3 pack-year smoking history. She has never used smokeless tobacco. She reports that she does not drink alcohol and does not use drugs.   Family History: The patient's family history includes Hypertension in her mother; Stroke in her mother.   ROS:  Please see the history  of present illness. Otherwise, complete review of systems is positive for none  All other systems are reviewed and negative.   Physical Exam: VS:  BP 118/60   Pulse 72   Ht 5\' 5"  (1.651 m)   Wt 121 lb 12.8 oz (55.2 kg)   SpO2 97%   BMI 20.27 kg/m , BMI Body mass index is 20.27 kg/m.  Wt Readings from Last 3 Encounters:  12/19/22 121 lb 12.8 oz (55.2 kg)  12/01/22 114 lb 1.9 oz (51.8 kg)  11/28/22 117 lb 3.2 oz (53.2 kg)    General: Patient appears comfortable at rest. HEENT: Conjunctiva and lids normal, oropharynx clear with moist mucosa. Neck: Supple, no elevated JVP or carotid bruits, no thyromegaly. Lungs: Clear to auscultation, nonlabored breathing at rest. Cardiac: Regular rate and rhythm, no S3 or significant systolic murmur, no pericardial rub. Abdomen: Soft, nontender, no hepatomegaly, bowel sounds present, no guarding or rebound. Extremities: No pitting edema, distal pulses 2+. Skin: Warm and dry. Musculoskeletal: No kyphosis. Neuropsychiatric: Alert and oriented x3, affect grossly appropriate.  Recent Labwork: 07/22/2022: TSH 5.428 10/04/2022: Magnesium 2.0 11/28/2022: ALT 20; AST 22; BUN 31; Creatinine, Ser 1.62; Hemoglobin 8.2; Platelets 206; Potassium 3.9; Sodium 136     Component Value Date/Time   CHOL 95 (L) 10/05/2020 0959   TRIG 87 10/05/2020 0959   HDL 27 (L) 10/05/2020 0959   CHOLHDL 3.5 10/05/2020 0959   CHOLHDL 3.0 11/03/2014 1003   VLDL 12 11/03/2014 1003   LDLCALC 51 10/05/2020 0959      Assessment and Plan:   Orthostatic hypotension likely drug-induced, resolved HTN, controlled PAC burden 2.4%, occasional, asymptomatic Mild to moderate AR in 2023 Chronic diastolic heart failure, compensated  -Initially on metoprolol tartrate 25 mg twice daily which was decreased to 12.5 mg twice daily with resolution of dizziness symptoms and improvement in BPs.  Home BPs range between 120 and 130 mmHg SBP.  Continue metoprolol titrate 12.5 mg twice daily.  Due to dizziness, 2-week event monitor was obtained that showed no evidence of arrhythmias,  2.4% PAC burden but patient denied having any palpitations.  Will not uptitrate rate controlling agents at this time.  For mild to moderate AR in 9/23, will repeat echocardiogram in 1 year before the next clinic visit.  She is compensated on today's examination.  I spent a total duration 30 minutes during the prior notes, EKG, labs, event monitor results, echo, face-to-face discussion/counseling of her medical condition, pathophysiology, evaluation, management, ordering test and documenting the findings in the note.      Medication Adjustments/Labs and Tests Ordered: Current medicines are reviewed at length with the patient today.  Concerns regarding medicines are outlined above.    Disposition:  Follow up 1 year  Signed Auset Fritzler Verne Spurr, MD, 12/19/2022 12:18 PM    [facility] [address] [place], [place], [place] [address]

## 2022-12-19 NOTE — Progress Notes (Signed)
History of Present Illness: Renee Waters is a 77 y.o. year old female here for follow-up of incomplete bladder emptying as well as recurrent urinary tract infection/postmenopausal atrophic vaginitis.  She does not tolerate perivaginal estrogen cream.  At her visit in April, she grew Enterococcus.  She was admitted to the hospital on 13 June.  Blood culture were positive but most likely skin contaminant.  She was hypertensive.  Urine culture was negative.  8.6.2024: Follow-up of recent UTI treatment.  She has not been performing double voiding.  At this visit, it was strongly recommended that she continue to do that.  She has been on trimethoprim suppression.  Past Medical History:  Diagnosis Date   Acute cholangitis    Acute respiratory failure with hypoxia (HCC) 09/01/2021   Allergy    Anemia    Anxiety    Arthritis    Phreesia 03/18/2020   Barrett's esophagus    Cataract    Chronic back pain    Chronic neck pain    CKD (chronic kidney disease) stage 3, GFR 30-59 ml/min (HCC) 10/29/2020   Depression    Genital herpes    GERD (gastroesophageal reflux disease)    H/O degenerative disc disease    History of blood transfusion    Hypertension    Hypoxia 12/01/2021   Insomnia    Lupus (systemic lupus erythematosus) (HCC)    Neuromuscular disorder (HCC)    Osteoarthritis    S/P colonoscopy 07/2003   normal, no polyps   S/P endoscopy June 2005, Oct 2009   2005: short-segment Barrett's, 2009: short-segment Barrett's   Upper respiratory tract infection 07/09/2020   UTI (lower urinary tract infection) 11/2012    Past Surgical History:  Procedure Laterality Date   ABDOMINAL HYSTERECTOMY     BACK SURGERY     BIOPSY N/A 03/20/2014   Procedure: BIOPSY;  Surgeon: Corbin Ade, MD;  Location: [facility];  Service: Endoscopy;  Laterality: N/A;   BIOPSY  09/14/2015   Procedure: BIOPSY;  Surgeon: Corbin Ade, MD;  [facility];  Service: Endoscopy;;  esophageal and gastric    BIOPSY  07/23/2019   Procedure: BIOPSY;  Surgeon: Corbin Ade, MD;  [facility];  Service: Endoscopy;;  esophageal    CARPAL TUNNEL RELEASE Left 2013   cervical disectomy  2002   CESAREAN SECTION N/A    Phreesia 03/18/2020   CHOLECYSTECTOMY     with lysis of adhesions for sbo; "ruptured gallbladder".   COLONOSCOPY  11/09/2011   RMR: Melanosis coli   COLONOSCOPY WITH PROPOFOL N/A 09/14/2015   Dr. Jena Gauss: diverticulosis, 3mm TA removed. next TCS 09/2020.    COLONOSCOPY WITH PROPOFOL N/A 04/30/2020   Procedure: COLONOSCOPY WITH PROPOFOL;  Surgeon: Corbin Ade, MD;  [facility];  Service: Endoscopy;  Laterality: N/A;  PM (ASA 3)   DENTAL SURGERY  11/2015   multiple tooth extraction   ESOPHAGOGASTRODUODENOSCOPY  11/29/2007   salmon-colored  tongue   longest stable at  3 cm, distal esophagus as described previously status post biopsy/ Hiatal hernia, otherwise normal stomach D1 and D2   ESOPHAGOGASTRODUODENOSCOPY  01/06/11   short segment Barrett's esophagus s/p bx/Hiatal hernia   ESOPHAGOGASTRODUODENOSCOPY (EGD) WITH PROPOFOL N/A 03/20/2014   [id number] distal esophagus short segment barrett's, bx with no dysplasia. next egd in 03/2017   ESOPHAGOGASTRODUODENOSCOPY (EGD) WITH PROPOFOL N/A 09/14/2015   Dr. Jena Gauss: Barrett's without dysplasia, gastritis benign bx, hiatal hernia. next EGD 09/2018.   ESOPHAGOGASTRODUODENOSCOPY (EGD) WITH  PROPOFOL N/A 07/23/2019   Procedure: ESOPHAGOGASTRODUODENOSCOPY (EGD) WITH PROPOFOL;  Surgeon: Corbin Ade, MD;  [facility];  Service: Endoscopy;  Laterality: N/A;  3:00pm   EYE SURGERY N/A    Phreesia 03/18/2020   HERNIA REPAIR Right 07/2010   Dr. Arlean Hopping   IR IMAGING GUIDED PORT INSERTION  02/09/2021   JOINT REPLACEMENT     LAPAROSCOPIC CHOLECYSTECTOMY  2017   at [facility]   POLYPECTOMY  09/14/2015   Procedure: POLYPECTOMY;  Surgeon: Corbin Ade, MD;  [facility];  Service: Endoscopy;;  ascending colon   right  hip replacement  07/2010   went back in sept 2012 to fix   SHOULDER ARTHROSCOPY  2008   left   SPINE SURGERY N/A    Phreesia 03/18/2020   TOTAL HIP REVISION Right 12/17/2012   Procedure: RIGHT TOTAL HIP REVISION;  Surgeon: [name], MD;  Location: [facility];  Service: Orthopedics;  Laterality: Right;   WRIST SURGERY Right 2011   open reduction right wrist.    Home Medications:  (Not in a hospital admission)   Allergies:  Allergies  Allergen Reactions   Bee Venom Swelling and Hives   Norvasc [Amlodipine] Other (See Comments)    Hair loss   Tylenol [Acetaminophen] Itching   Hydralazine Other (See Comments)    Hair loss   Red Dye #40 (Allura Red) Itching   Percocet [Oxycodone-Acetaminophen] Rash and Other (See Comments)    Pt states, "this gives her a rash, but at home she takes oxycodone for pain relief"    Tyloxapol Itching and Rash    Family History  Problem Relation Age of Onset   Hypertension Mother    Stroke Mother    Colon cancer Neg Hx    Anesthesia problems Neg Hx    Hypotension Neg Hx    Malignant hyperthermia Neg Hx    Pseudochol deficiency Neg Hx    Gastric cancer Neg Hx    Esophageal cancer Neg Hx     Social History:  reports that she quit smoking about 19 years ago. Her smoking use included cigarettes. She started smoking about 44 years ago. She has a 6.3 pack-year smoking history. She has never used smokeless tobacco. She reports that she does not drink alcohol and does not use drugs.  ROS: A complete review of systems was performed.  All systems are negative except for pertinent findings as noted.  Physical Exam:  Vital signs in last 24 hours: @VSRANGES @ General:  Alert and oriented, No acute distress HEENT: Normocephalic, atraumatic Neck: No JVD or lymphadenopathy Cardiovascular: Regular rate  Lungs: Normal inspiratory/expiratory excursion Extremities: No edema Neurologic: Grossly intact  I have reviewed prior pt notes  I have reviewed  urinalysis results  I have independently reviewed bladder scan-450 mL  I have reviewed prior urine culture   Impression/Assessment:  Incomplete bladder emptying-still not doing double voiding  Recurrent UTIs, currently asymptomatic  Plan:  She will continue the trimethoprim  Double void 2-3 times a day  I will see her back in about 3 months for recheck.  Bertram Millard Wilfrid Hyser 12/19/2022, 1:12 PM  Bertram Millard. Haidyn Kilburg MD

## 2022-12-20 ENCOUNTER — Ambulatory Visit (INDEPENDENT_AMBULATORY_CARE_PROVIDER_SITE_OTHER): Payer: Medicare Other | Admitting: Urology

## 2022-12-20 ENCOUNTER — Encounter: Payer: Self-pay | Admitting: Urology

## 2022-12-20 VITALS — BP 106/71 | HR 80 | Ht 65.0 in | Wt 120.8 lb

## 2022-12-20 DIAGNOSIS — Z87440 Personal history of urinary (tract) infections: Secondary | ICD-10-CM | POA: Diagnosis not present

## 2022-12-20 DIAGNOSIS — R339 Retention of urine, unspecified: Secondary | ICD-10-CM

## 2022-12-20 DIAGNOSIS — N952 Postmenopausal atrophic vaginitis: Secondary | ICD-10-CM

## 2022-12-20 DIAGNOSIS — N390 Urinary tract infection, site not specified: Secondary | ICD-10-CM

## 2022-12-20 LAB — BLADDER SCAN AMB NON-IMAGING: Scan Result: 358

## 2022-12-20 NOTE — Progress Notes (Signed)
post void residual =358

## 2022-12-21 ENCOUNTER — Other Ambulatory Visit: Payer: Self-pay

## 2022-12-23 DIAGNOSIS — I129 Hypertensive chronic kidney disease with stage 1 through stage 4 chronic kidney disease, or unspecified chronic kidney disease: Secondary | ICD-10-CM | POA: Diagnosis not present

## 2022-12-23 DIAGNOSIS — N179 Acute kidney failure, unspecified: Secondary | ICD-10-CM | POA: Diagnosis not present

## 2022-12-23 DIAGNOSIS — D630 Anemia in neoplastic disease: Secondary | ICD-10-CM | POA: Diagnosis not present

## 2022-12-23 DIAGNOSIS — D631 Anemia in chronic kidney disease: Secondary | ICD-10-CM | POA: Diagnosis not present

## 2022-12-23 DIAGNOSIS — N184 Chronic kidney disease, stage 4 (severe): Secondary | ICD-10-CM | POA: Diagnosis not present

## 2022-12-23 DIAGNOSIS — C946 Myelodysplastic disease, not classified: Secondary | ICD-10-CM | POA: Diagnosis not present

## 2022-12-23 IMAGING — CT CT BIOPSY
1 of 3 series · 15 of 28 positions shown, 19 images · non-contrast
Comparison: none

CLINICAL DATA: Leukocytosis with left shift and leukoerythroblastic
reaction. Possible leukemia. Need for bone marrow biopsy.

[Series 2: i-spiral 5.0 br40 · axial · 0.98mm/px · z∈[-481,-400]mm · 15 of 27 slices shown, 19 images]
[im 2/27  mediastinal]
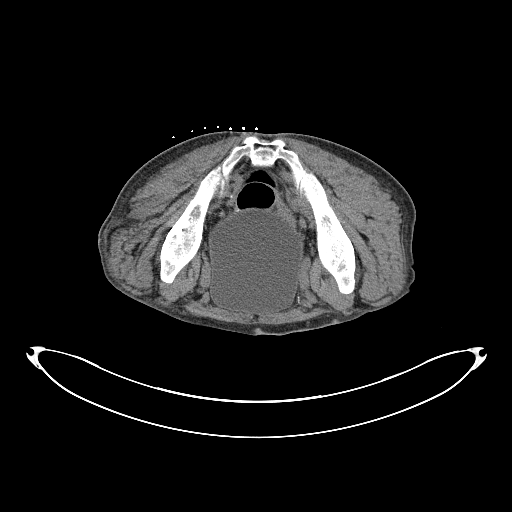
[im 2/27  lung]
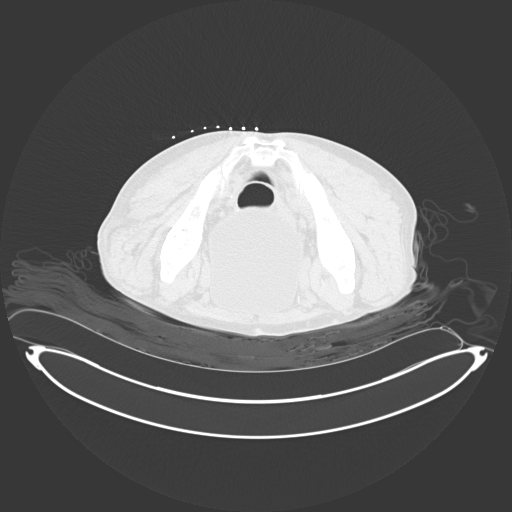
[im 4/27  lung]
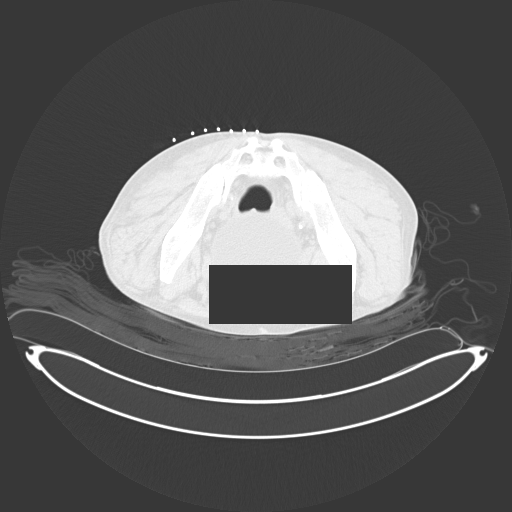
[im 5/27  lung]
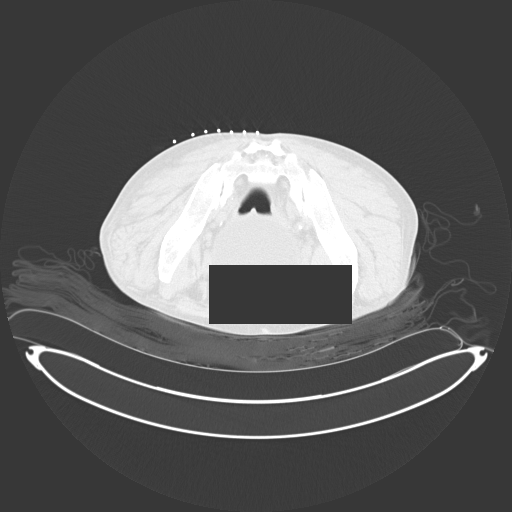
[im 7/27  lung]
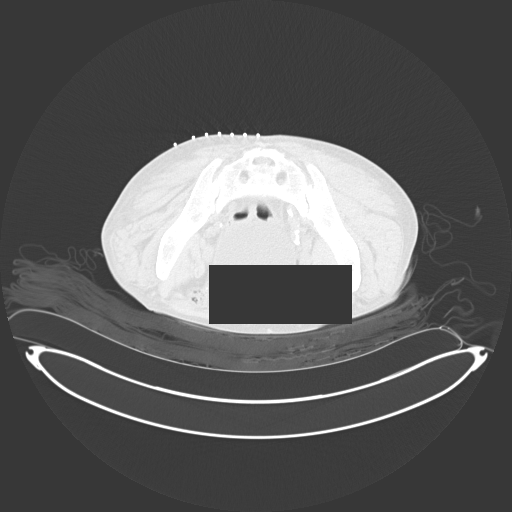
[im 9/27  mediastinal]
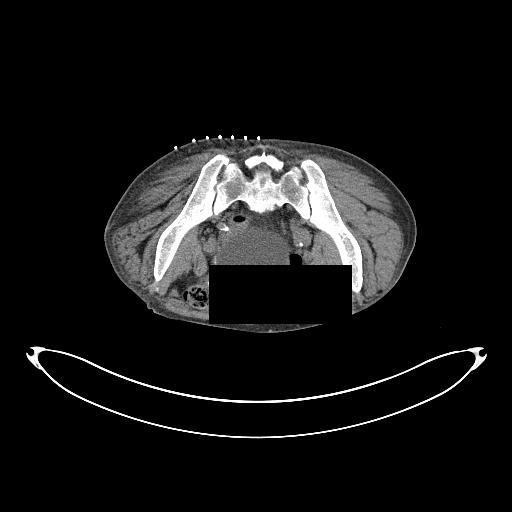
[im 9/27  lung]
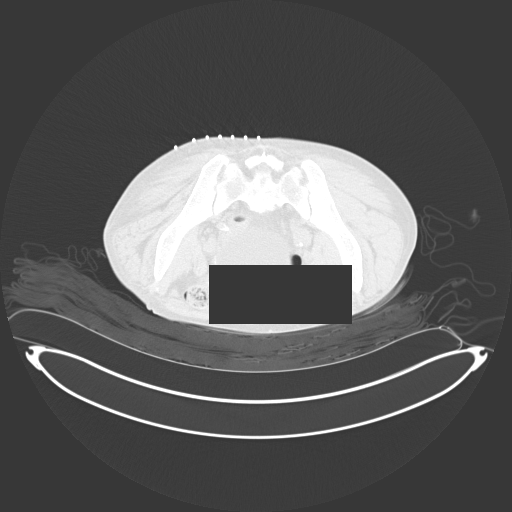
[im 10/27  lung]
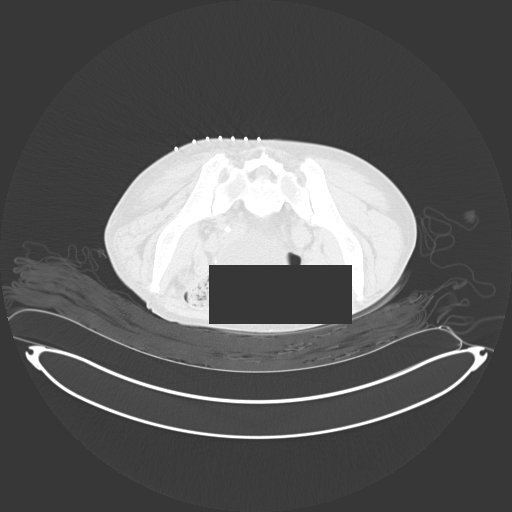
[im 12/27  lung]
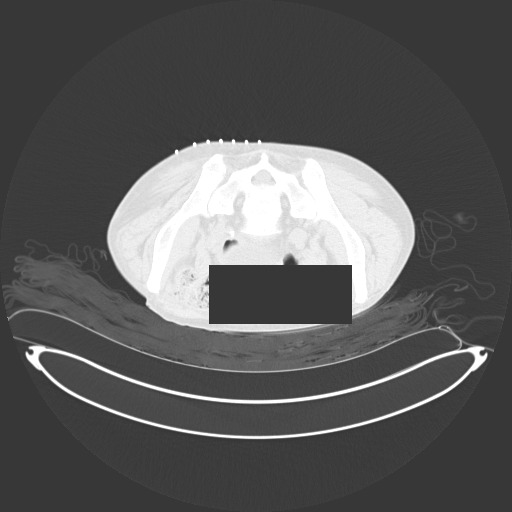
[im 14/27  lung]
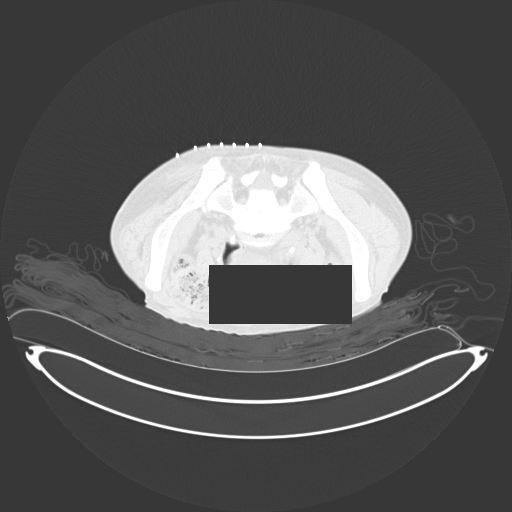
[im 15/27  mediastinal]
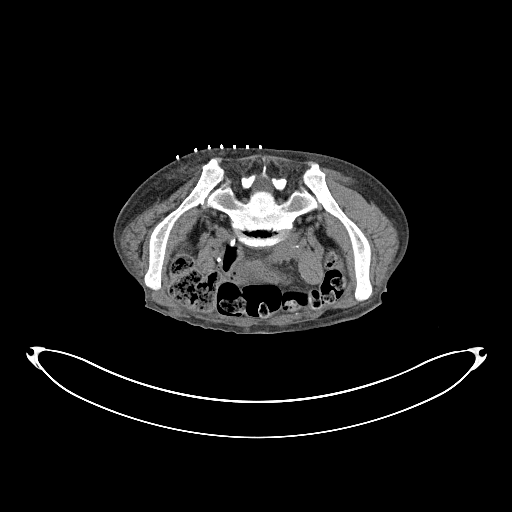
[im 15/27  lung]
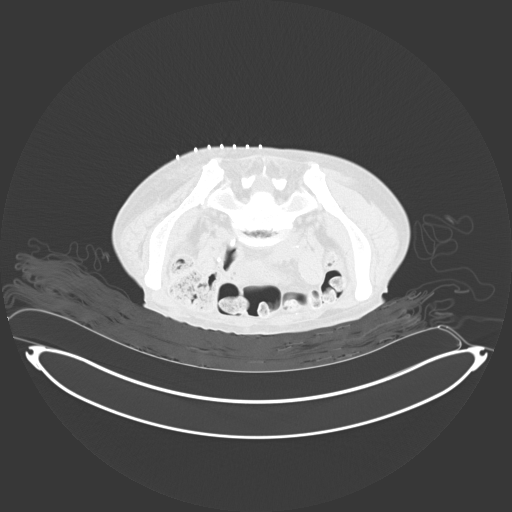
[im 17/27  lung]
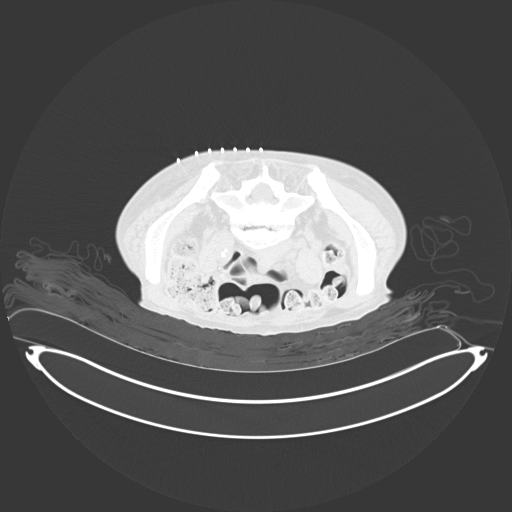
[im 19/27  lung]
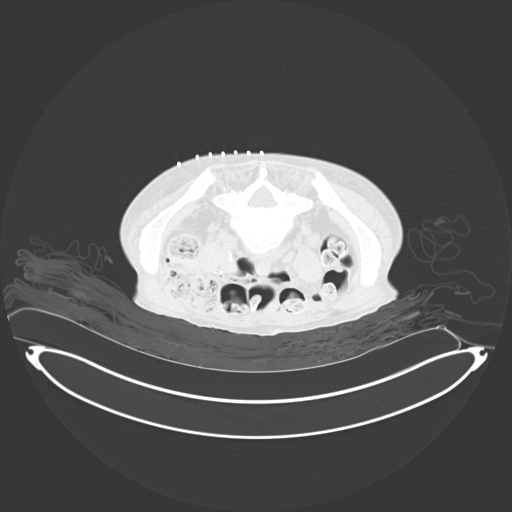
[im 20/27  lung]
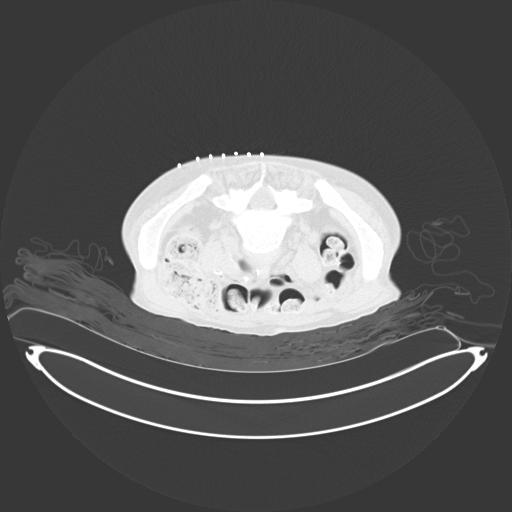
[im 22/27  mediastinal]
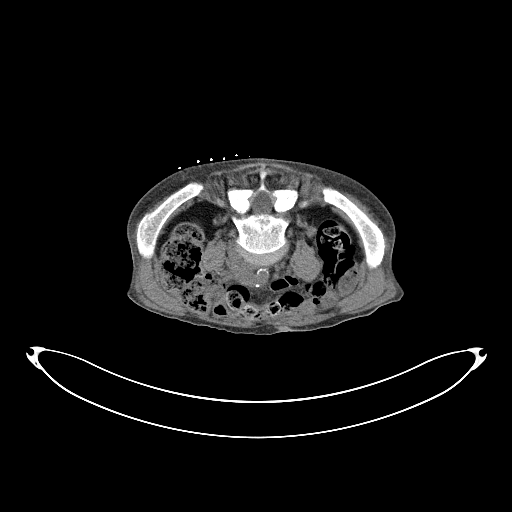
[im 22/27  lung]
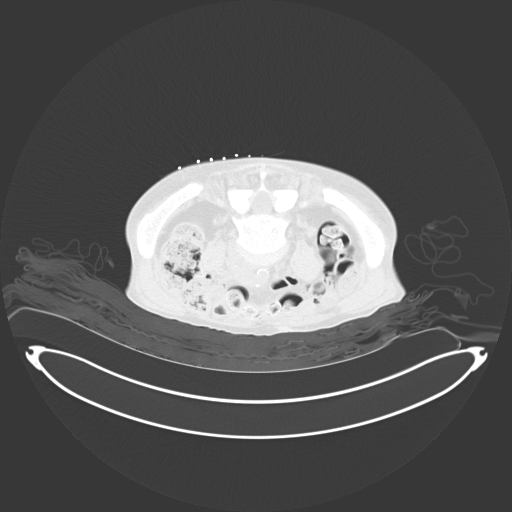
[im 24/27  lung]
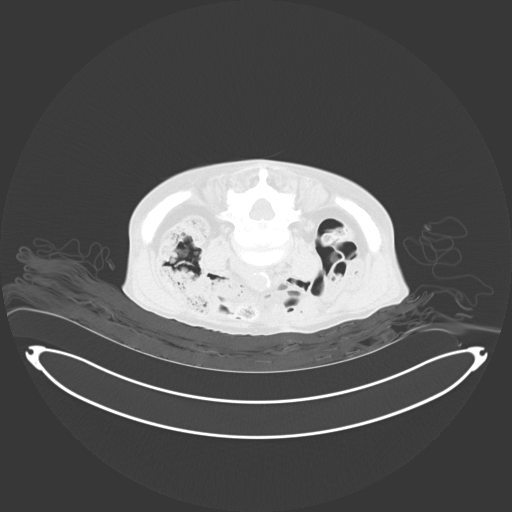
[im 25/27  lung]
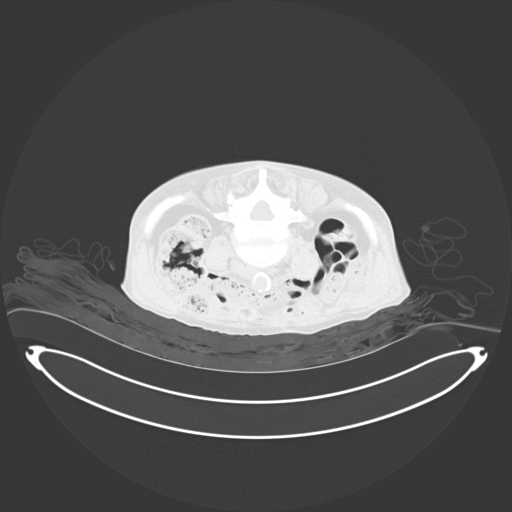

[15 of 28 positions shown; findings below may reference images not displayed]

EXAM:
CT GUIDED BONE MARROW ASPIRATION AND BIOPSY

ANESTHESIA/SEDATION:
Moderate (conscious) sedation was employed during this procedure. A
total of Versed 2.5 mg and Fentanyl 100 mcg was administered
intravenously by radiology nursing under my direct supervision.

Moderate Sedation Time: 19 minutes. The patient's level of
consciousness and vital signs were monitored continuously by
radiology nursing throughout the procedure under my direct
supervision.

PROCEDURE:
The procedure risks, benefits, and alternatives were explained to
the patient. Questions regarding the procedure were encouraged and
answered. The patient understands and consents to the procedure. A
time out was performed prior to initiating the procedure.

The right gluteal region was prepped with chlorhexidine. Sterile
gown and sterile gloves were used for the procedure. Local
anesthesia was provided with 1% Lidocaine.

Under CT guidance, an 11 gauge On Control bone cutting needle was
advanced from a posterior approach into the right iliac bone. Needle
positioning was confirmed with CT. Initial attempt at obtaining
aspirate samples of bone marrow was performed. Core biopsy was
performed via the On Control drill needle. Two separate core biopsy
samples were obtained.

COMPLICATIONS:
None
FINDINGS: Aspiration yielded a dry tap with no liquid bone marrow. For this
reason, 2 separate core biopsy samples were obtained in slightly
different positions of the right iliac bone.
IMPRESSION: CT guided bone marrow biopsy of right posterior iliac bone. The
aspirate attempt yielded a dry tap. Two separate core biopsy samples
were obtained.

## 2022-12-26 ENCOUNTER — Other Ambulatory Visit (HOSPITAL_COMMUNITY): Payer: Self-pay | Admitting: Pharmacy Technician

## 2022-12-26 ENCOUNTER — Other Ambulatory Visit: Payer: Self-pay

## 2022-12-26 ENCOUNTER — Other Ambulatory Visit: Payer: Self-pay | Admitting: Hematology

## 2022-12-26 ENCOUNTER — Other Ambulatory Visit (HOSPITAL_COMMUNITY): Payer: Self-pay

## 2022-12-26 ENCOUNTER — Inpatient Hospital Stay: Payer: Medicare Other | Attending: Hematology

## 2022-12-26 ENCOUNTER — Ambulatory Visit: Payer: Self-pay | Admitting: Family Medicine

## 2022-12-26 VITALS — BP 138/67 | HR 59 | Temp 98.4°F | Resp 17

## 2022-12-26 DIAGNOSIS — D471 Chronic myeloproliferative disease: Secondary | ICD-10-CM

## 2022-12-26 DIAGNOSIS — Z95828 Presence of other vascular implants and grafts: Secondary | ICD-10-CM

## 2022-12-26 DIAGNOSIS — D649 Anemia, unspecified: Secondary | ICD-10-CM

## 2022-12-26 LAB — CBC
HCT: 24.3 % — ABNORMAL LOW (ref 36.0–46.0)
Hemoglobin: 7.9 g/dL — ABNORMAL LOW (ref 12.0–15.0)
MCH: 32.9 pg (ref 26.0–34.0)
MCHC: 32.5 g/dL (ref 30.0–36.0)
MCV: 101.3 fL — ABNORMAL HIGH (ref 80.0–100.0)
Platelets: 159 10*3/uL (ref 150–400)
RBC: 2.4 MIL/uL — ABNORMAL LOW (ref 3.87–5.11)
RDW: 18.4 % — ABNORMAL HIGH (ref 11.5–15.5)
WBC: 20.3 10*3/uL — ABNORMAL HIGH (ref 4.0–10.5)
nRBC: 2.5 % — ABNORMAL HIGH (ref 0.0–0.2)

## 2022-12-26 LAB — SAMPLE TO BLOOD BANK

## 2022-12-26 MED ORDER — SODIUM CHLORIDE 0.9% FLUSH
10.0000 mL | Freq: Once | INTRAVENOUS | Status: AC
  Administered 2022-12-26: 10 mL via INTRAVENOUS

## 2022-12-26 MED ORDER — HEPARIN SOD (PORK) LOCK FLUSH 100 UNIT/ML IV SOLN
500.0000 [IU] | Freq: Once | INTRAVENOUS | Status: AC
  Administered 2022-12-26: 500 [IU] via INTRAVENOUS

## 2022-12-26 NOTE — Telephone Encounter (Signed)
Copied from CRM [id number]. Topic: Clinical - Red Word Triage >> Dec 26, 2022  1:15 PM Lorin Glass B wrote: Red Word that prompted transfer to Nurse Triage: Diarrhea, after recent rx after last visit for hand injury. [facility] doctor prescribed an antibiotic and has been having severe diarrhea. Stopped taking medicine on 11/15 and its still happening. Taking immodium and changed diet but nothing helps. Even has stopped another antibiotic for current cancer dx.  Chief Complaint: Patient reports she has has a Diarrhea up to 7 time per day since she started the antibiotic prescribed for her hand on 12/13/2022.Marland Kitchen Symptoms:  Watery stools   Frequency: up to 7 times per day. Pertinent Negatives: Patient denies vomiting, abdomen pain. Patient states she her mouth is moist and her urine if fine. Disposition: [] ED /[] Urgent Care (no appt availability in office) / [x] Appointment(In office/virtual)/ []   Virtual Care/ [] Home Care/ [] Refused Recommended Disposition /[]  Mobile Bus/ []  Follow-up with PCP Additional Notes: Patient: Patient was prescribed  an antibiotic on 11/5 for her hand injury. She also takes antibiotic to prevent UTI r/t Cancer. Patient stopper both antibiotic on 12/23/2022  Reason for Disposition  MODERATE diarrhea (e.g., 4-6 times / day more than normal)  Answer Assessment - Initial Assessment Questions 1. ANTIBIOTIC: "What antibiotic are you taking?" "How many times per day?"    Diarrhea  7 times per day. Stopped antibiotic on 11/15 2. ANTIBIOTIC ONSET: "When was the antibiotic started?"      Started  Antibiotic - for Hand injury 3. DIARRHEA SEVERITY: "How bad is the diarrhea?" "How many more stools have you had in the past 24 hours than normal?"    -    -  MODERATE (SCALE 4-7): Increase of 4-6 stools daily over normal; moderate increase in ostomy output.  4. ONSET: "When did the diarrhea begin?"       Don't recall; 5. BM CONSISTENCY: "How loose or watery is the diarrhea?"       watery 6. VOMITING: "Are you also vomiting?" If Yes, ask: "How many times in the past 24 hours?"      NO COMMITTING  7. ABDOMEN PAIN: "Are you having any abdomen pain?" If Yes, ask: "What does it feel like?" (e.g., crampy, dull, intermittent, constant)      NO DID NOT FEEL CRAMPS . 8. ABDOMEN PAIN SEVERITY: If present, ask: "How bad is the pain?"  (e.g., Scale 1-10; mild, moderate, or severe)   - MILD (1-3): doesn't interfere with normal activities, abdomen soft and not tender to touch    -      9. ORAL INTAKE: If vomiting, "Have you been able to drink liquids?" "How much liquids have you had in the past 24 hours?"      NO PROBLEMS 10. HYDRATION: "Any signs of dehydration?" (e.g., dry mouth [not just dry lips], too weak to stand, dizziness, new weight loss) "When did you last urinate?"      Denies 11. EXPOSURE: "Have you traveled to a foreign country recently?" "Have you been exposed to anyone with diarrhea?" "Could you have eaten any food that was spoiled?"     Denies. 12. OTHER SYMPTOMS: "Do you have any other symptoms?" (e.g., fever, blood in stool)      Denies  Protocols used: Diarrhea on Antibiotics-A-AH

## 2022-12-26 NOTE — Progress Notes (Signed)
HGB 7.9. Called and spoke with patient . Patient denies any shortness of breath, chest pain or fatigue. Message sent to Dr. Anders Simmonds. NO blood products needed at this time. Patient aware.

## 2022-12-26 NOTE — Patient Instructions (Signed)
[facility] - A DEPT OF [facility]  Discharge Instructions: Thank you for choosing [facility] to provide your oncology and hematology care.  If you have a lab appointment with the Cancer Center - please note that after April 8th, 2024, all labs will be drawn in the cancer center.  You do not have to check in or register with the main entrance as you have in the past but will complete your check-in in the cancer center.  Wear comfortable clothing and clothing appropriate for easy access to any Portacath or PICC line.   We strive to give you quality time with your provider. You may need to reschedule your appointment if you arrive late (15 or more minutes).  Arriving late affects you and other patients whose appointments are after yours.  Also, if you miss three or more appointments without notifying the office, you may be dismissed from the clinic at the provider's discretion.      For prescription refill requests, have your pharmacy contact our office and allow 72 hours for refills to be completed.    Today you received the following port flush with lab draw, return as scheduled.   To help prevent nausea and vomiting after your treatment, we encourage you to take your nausea medication as directed.  BELOW ARE SYMPTOMS THAT SHOULD BE REPORTED IMMEDIATELY: *FEVER GREATER THAN 100.4 F (38 C) OR HIGHER *CHILLS OR SWEATING *NAUSEA AND VOMITING THAT IS NOT CONTROLLED WITH YOUR NAUSEA MEDICATION *UNUSUAL SHORTNESS OF BREATH *UNUSUAL BRUISING OR BLEEDING *URINARY PROBLEMS (pain or burning when urinating, or frequent urination) *BOWEL PROBLEMS (unusual diarrhea, constipation, pain near the anus) TENDERNESS IN MOUTH AND THROAT WITH OR WITHOUT PRESENCE OF ULCERS (sore throat, sores in mouth, or a toothache) UNUSUAL RASH, SWELLING OR PAIN  UNUSUAL VAGINAL DISCHARGE OR ITCHING   Items with * indicate a potential emergency and should be followed up as  soon as possible or go to the Emergency Department if any problems should occur.  Please show the CHEMOTHERAPY ALERT CARD or IMMUNOTHERAPY ALERT CARD at check-in to the Emergency Department and triage nurse.  Should you have questions after your visit or need to cancel or reschedule your appointment, please contact [facility] - A DEPT OF [facility] [phone]  and follow the prompts.  Office hours are 8:00 a.m. to 4:30 p.m. Monday - Friday. Please note that voicemails left after 4:00 p.m. may not be returned until the following business day.  We are closed weekends and major holidays. You have access to a nurse at all times for urgent questions. Please call the main number to the clinic [phone] and follow the prompts.  For any non-urgent questions, you may also contact your provider using MyChart. We now offer e-Visits for anyone 65 and older to request care online for non-urgent symptoms. For details visit [email or web address].   Also download the MyChart app! Go to the app store, search "MyChart", open the app, select [place], and log in with your MyChart username and password.

## 2022-12-26 NOTE — Progress Notes (Signed)
Specialty Pharmacy Ongoing Clinical Assessment Note  Renee Waters is a 77 y.o. female who is being followed by the specialty pharmacy service for RxSp Oncology   Patient's specialty medication(s) reviewed today: Momelotinib Dihydrochloride   Missed doses in the last 4 weeks: 0   Patient/Caregiver did not have any additional questions or concerns.   Therapeutic benefit summary: Unable to assess   Adverse events/side effects summary: No adverse events/side effects   Patient's therapy is appropriate to: Continue    Goals Addressed             This Visit's Progress    Stabilization of disease       Patient is on track. Patient will maintain adherence         Follow up:  6 months  Bobette Mo Specialty Pharmacist

## 2022-12-26 NOTE — Patient Instructions (Signed)

## 2022-12-26 NOTE — Progress Notes (Signed)
Specialty Pharmacy Refill Coordination Note  Renee Waters is a 77 y.o. female contacted today regarding refills of specialty medication(s) Momelotinib Dihydrochloride   Patient requested Delivery   Delivery date: 12/29/22   Verified address: [address]  [place]   Medication will be filled on 12/28/22.  Refill Request sent to MD; Call if any delays

## 2022-12-26 NOTE — Progress Notes (Signed)
Port flushed with good blood return noted. No bruising or swelling at site. Bandaid applied and patient discharged in satisfactory condition. VVS stable with no signs or symptoms of distressed noted. 

## 2022-12-26 NOTE — Progress Notes (Unsigned)
   Established Patient Office Visit   Subjective  Patient ID: Renee Waters, female    DOB: Oct 24, 1945  Age: 77 y.o. MRN: [id number]  No chief complaint on file.   She  has a past medical history of Acute cholangitis, Acute respiratory failure with hypoxia (HCC) (09/01/2021), Allergy, Anemia, Anxiety, Arthritis, Barrett's esophagus, Cataract, Chronic back pain, Chronic neck pain, CKD (chronic kidney disease) stage 3, GFR 30-59 ml/min (HCC) (10/29/2020), Depression, Genital herpes, GERD (gastroesophageal reflux disease), H/O degenerative disc disease, History of blood transfusion, Hypertension, Hypoxia (12/01/2021), Insomnia, Lupus (systemic lupus erythematosus) (HCC), Neuromuscular disorder (HCC), Osteoarthritis, S/P colonoscopy (07/2003), S/P endoscopy (June 2005, Oct 2009), Upper respiratory tract infection (07/09/2020), and UTI (lower urinary tract infection) (11/2012).  HPI  ROS    Objective:     There were no vitals taken for this visit. {Vitals History (Optional):23777}  Physical Exam   No results found for any visits on 12/27/22.  The ASCVD Risk score (Arnett DK, et al., 2019) failed to calculate for the following reasons:   The valid total cholesterol range is 130 to 320 mg/dL    Assessment & Plan:  There are no diagnoses linked to this encounter.  No follow-ups on file.   Cruzita Lederer Newman Nip, FNP

## 2022-12-27 ENCOUNTER — Ambulatory Visit: Payer: Self-pay | Admitting: Family Medicine

## 2022-12-27 ENCOUNTER — Encounter: Payer: Self-pay | Admitting: Family Medicine

## 2022-12-27 ENCOUNTER — Other Ambulatory Visit: Payer: Self-pay | Admitting: Hematology

## 2022-12-27 ENCOUNTER — Other Ambulatory Visit (HOSPITAL_COMMUNITY): Payer: Self-pay

## 2022-12-27 ENCOUNTER — Other Ambulatory Visit: Payer: Self-pay

## 2022-12-27 VITALS — BP 118/73 | HR 60 | Ht 65.0 in | Wt 122.1 lb

## 2022-12-27 DIAGNOSIS — R197 Diarrhea, unspecified: Secondary | ICD-10-CM | POA: Insufficient documentation

## 2022-12-27 DIAGNOSIS — D471 Chronic myeloproliferative disease: Secondary | ICD-10-CM

## 2022-12-27 MED ORDER — MOMELOTINIB DIHYDROCHLORIDE 100 MG PO TABS
100.0000 mg | ORAL_TABLET | Freq: Every day | ORAL | 3 refills | Status: DC
  Filled 2022-12-27: qty 30, 30d supply, fill #0
  Filled 2023-01-17: qty 30, 30d supply, fill #1
  Filled 2023-02-20: qty 30, 30d supply, fill #2
  Filled 2023-03-27: qty 30, 30d supply, fill #3

## 2022-12-27 NOTE — Assessment & Plan Note (Signed)
GI Profile Stool Ordered. Advise OTC loperamide and bismuth subsalicylate for diarrhea management Explained patient increase fluid intake 8 glasses of water a day to prevent dehydration. Avoid caffeine and alcohol. Add semisolid and low-fiber foods gradually as bowel movements return to normal. Follow a BRAT diet bananas, rice, applesauce, toast helping firm up stool. Follow-up in unable to keep food/fluid down x 24 hours, dizziness, fevers, worsening or persistent symptoms to present to ED or contact primary care provider. Patient verbalizes understanding regarding plan of care and all questions answered.

## 2022-12-28 ENCOUNTER — Other Ambulatory Visit (HOSPITAL_COMMUNITY): Payer: Self-pay

## 2022-12-29 ENCOUNTER — Telehealth (INDEPENDENT_AMBULATORY_CARE_PROVIDER_SITE_OTHER): Payer: Medicare Other | Admitting: Family Medicine

## 2022-12-29 VITALS — BP 130/73 | Wt 122.0 lb

## 2022-12-29 DIAGNOSIS — N184 Chronic kidney disease, stage 4 (severe): Secondary | ICD-10-CM | POA: Diagnosis not present

## 2022-12-29 DIAGNOSIS — F419 Anxiety disorder, unspecified: Secondary | ICD-10-CM

## 2022-12-29 DIAGNOSIS — D471 Chronic myeloproliferative disease: Secondary | ICD-10-CM | POA: Diagnosis not present

## 2022-12-29 DIAGNOSIS — F324 Major depressive disorder, single episode, in partial remission: Secondary | ICD-10-CM | POA: Diagnosis not present

## 2022-12-29 DIAGNOSIS — N179 Acute kidney failure, unspecified: Secondary | ICD-10-CM | POA: Diagnosis not present

## 2022-12-29 DIAGNOSIS — D631 Anemia in chronic kidney disease: Secondary | ICD-10-CM | POA: Diagnosis not present

## 2022-12-29 DIAGNOSIS — C946 Myelodysplastic disease, not classified: Secondary | ICD-10-CM | POA: Diagnosis not present

## 2022-12-29 DIAGNOSIS — I129 Hypertensive chronic kidney disease with stage 1 through stage 4 chronic kidney disease, or unspecified chronic kidney disease: Secondary | ICD-10-CM | POA: Diagnosis not present

## 2022-12-29 DIAGNOSIS — D630 Anemia in neoplastic disease: Secondary | ICD-10-CM | POA: Diagnosis not present

## 2022-12-29 LAB — GI PROFILE, STOOL, PCR

## 2022-12-29 NOTE — Progress Notes (Signed)
Virtual Visit via Video Note  I connected with Renee Waters on 12/29/22 at  4:20 PM EST by a video enabled telemedicine application and verified that I am speaking with the correct person using two identifiers.  Location: Patient: home Provider: office   I discussed the limitations of evaluation and management by telemedicine and the availability of in person appointments. The patient expressed understanding and agreed to proceed.  History of Present Illness: Called for virtual due to report of patient being depressed. She is not suicidal or homicidal  Tired of pain, back, hip and knee, tired of being ill and unable to do things which she used to be able and would like to do, holidays are especially stressful Expects son in which she is looking forward to, he may be relocating, one of her wo daughters she sees more often than the other. Spouse very supportive Would like to speak with and would benefit from therapy, have been unable to find psychological support in the past, is involved with hospice as far as pain mangement is concerned and serious illness Screening score for both depression and anxiety are low during the visit, however, she id start crying and says that it is a rapidly fluctuating situation Observations/Objective: BP 130/73   Wt 122 lb (55.3 kg)   BMI 20.30 kg/m  Good communication with no confusion and intact memory. Alert and oriented x 3 No signs of respiratory distress during speech   Assessment and Plan: Anxiety Increased anxiety fluctuates a lot with health and medical reports which is understandable, will benefit from and needs long term psychological support , will reach out o [facility] as to what resources are available  Depression, major, single episode, in partial remission (HCC) Screen at visit is negative , though during interview becomes tearful, and as is understandable stress of holidaysis additional to her regular stress Not suicidalor homicdal , will  reach out to hospice to see what support is availablefor her  MPN (myeloproliferative neoplasm) (HCC) Recent labs are greatly improved in the ;last 4 months, which is a wonderful window of hope for patient and her family   Follow Up Instructions:    I discussed the assessment and treatment plan with the patient. The patient was provided an opportunity to ask questions and all were answered. The patient agreed with the plan and demonstrated an understanding of the instructions.   The patient was advised to call back or seek an in-person evaluation if the symptoms worsen or if the condition fails to improve as anticipated.  I provided 18 minutes of non-face-to-face time during this encounter.   Syliva Overman, MD

## 2022-12-29 NOTE — Patient Instructions (Addendum)
F/U in January as before  No medication to be started for depression, your actual score is not very high, and I believe that you will benefit from therapy and will continue tolook into this  Best for the season  Thanks for choosing [facility], we consider it a privelige to serve you.

## 2023-01-01 ENCOUNTER — Encounter: Payer: Self-pay | Admitting: Family Medicine

## 2023-01-01 NOTE — Assessment & Plan Note (Signed)
Screen at visit is negative , though during interview becomes tearful, and as is understandable stress of holidaysis additional to her regular stress Not suicidalor homicdal , will reach out to hospice to see what support is availablefor her

## 2023-01-01 NOTE — Assessment & Plan Note (Signed)
Recent labs are greatly improved in the ;last 4 months, which is a wonderful window of hope for patient and her family

## 2023-01-01 NOTE — Assessment & Plan Note (Signed)
Increased anxiety fluctuates a lot with health and medical reports which is understandable, will benefit from and needs long term psychological support , will reach out o [facility] as to what resources are available

## 2023-01-03 ENCOUNTER — Other Ambulatory Visit: Payer: Self-pay | Admitting: Family Medicine

## 2023-01-03 ENCOUNTER — Ambulatory Visit: Payer: Self-pay | Admitting: Family Medicine

## 2023-01-03 ENCOUNTER — Telehealth: Payer: Medicare Other | Admitting: Physician Assistant

## 2023-01-03 DIAGNOSIS — R197 Diarrhea, unspecified: Secondary | ICD-10-CM

## 2023-01-03 NOTE — Patient Instructions (Signed)
Lorenza Burton, thank you for joining Piedad Climes, PA-C for today's virtual visit.  While this provider is not your primary care provider (PCP), if your PCP is located in our provider database this encounter information will be shared with them immediately following your visit.   A [facility] MyChart account gives you access to today's visit and all your visits, tests, and labs performed at [facility] " click here if you don't have a  MyChart account or go to [email or web address]  Consent: (Patient) Renee Waters provided verbal consent for this virtual visit at the beginning of the encounter.  Current Medications:  Current Outpatient Medications:    acyclovir (ZOVIRAX) 800 MG tablet, Take 1 tablet (800 mg total) by mouth 5 (five) times daily as needed., Disp: 30 tablet, Rfl: 0   allopurinol (ZYLOPRIM) 100 MG tablet, Take 2 tablets by mouth once daily, Disp: 60 tablet, Rfl: 0   cetirizine (ZYRTEC) 10 MG tablet, Take 10 mg by mouth daily., Disp: , Rfl:    cholecalciferol (CHOLECALCIFEROL) 25 MCG tablet, Take 1 tablet (1,000 Units total) by mouth daily., Disp: , Rfl:    clindamycin (CLEOCIN) 300 MG capsule, Take 300 mg by mouth 3 (three) times daily. (Patient not taking: Reported on 12/27/2022), Disp: , Rfl:    EPINEPHrine 0.3 mg/0.3 mL IJ SOAJ injection, INJECT 0.3 MLS INTO MUSCLE ONCE AS NEEDED, Disp: 1 each, Rfl: 2   hydrOXYzine (ATARAX) 50 MG tablet, Take 1 tablet by mouth three times daily as needed, Disp: 90 tablet, Rfl: 0   lidocaine-prilocaine (EMLA) cream, Apply 1 Application topically as needed (Access to port and pain)., Disp: 30 g, Rfl: 6   metoprolol tartrate (LOPRESSOR) 25 MG tablet, Take 0.5 tablets (12.5 mg total) by mouth 2 (two) times daily., Disp: , Rfl:    mirtazapine (REMERON) 15 MG tablet, Take 15 mg by mouth at bedtime., Disp: , Rfl:    momelotinib dihydrochloride (OJJAARA) 100 MG tablet, Take 1 tablet (100 mg total) by mouth daily.,  Disp: 30 tablet, Rfl: 3   Multiple Vitamin (MULTIVITAMIN WITH MINERALS) TABS tablet, Take 1 tablet by mouth daily., Disp: 100 tablet, Rfl: 1   naloxegol oxalate (MOVANTIK) 25 MG TABS tablet, Take 1 tablet (25 mg total) by mouth daily., Disp: 30 tablet, Rfl: 11   ondansetron (ZOFRAN-ODT) 8 MG disintegrating tablet, Take 1 tablet (8 mg total) by mouth every 8 (eight) hours as needed for nausea or vomiting., Disp: 20 tablet, Rfl: 0   oxyCODONE (ROXICODONE) 15 MG immediate release tablet, Take 15 mg by mouth every 6 (six) hours as needed., Disp: , Rfl:    pantoprazole (PROTONIX) 40 MG tablet, Take 1 tablet (40 mg total) by mouth 2 (two) times daily before a meal., Disp: 60 tablet, Rfl: 11   potassium chloride (KLOR-CON) 10 MEQ tablet, Take 1 tablet by mouth once daily, Disp: 30 tablet, Rfl: 0   pregabalin (LYRICA) 25 MG capsule, Take 1 capsule (25 mg total) by mouth daily., Disp: 30 capsule, Rfl: 3   prochlorperazine (COMPAZINE) 10 MG tablet, TAKE 1 TABLET BY MOUTH EVERY 6 HOURS AS NEEDED FOR NAUSEA OR VOMITING, Disp: 60 tablet, Rfl: 0   temazepam (RESTORIL) 30 MG capsule, Take 1 capsule (30 mg total) by mouth at bedtime., Disp: 30 capsule, Rfl: 5   triamcinolone ointment (KENALOG) 0.5 %, Apply topically daily., Disp: , Rfl:    trimethoprim (TRIMPEX) 100 MG tablet, Take 1 tablet (100 mg total) by mouth daily., Disp: 30 tablet, Rfl: 11 No  current facility-administered medications for this visit.  Facility-Administered Medications Ordered in Other Visits:    lanreotide acetate (SOMATULINE DEPOT) 120 MG/0.5ML injection, , , ,    lanreotide acetate (SOMATULINE DEPOT) 120 MG/0.5ML injection, , , ,    lanreotide acetate (SOMATULINE DEPOT) 120 MG/0.5ML injection, , , ,    octreotide (SANDOSTATIN LAR) 30 MG IM injection, , , ,    octreotide (SANDOSTATIN LAR) 30 MG IM injection, , , ,    Medications ordered in this encounter:  No orders of the defined types were placed in this encounter.    *If you need  refills on other medications prior to your next appointment, please contact your pharmacy*  Follow-Up: Call back or seek an in-person evaluation if the symptoms worsen or if the condition fails to improve as anticipated.  [facility] [phone]  Other Instructions  If you have been instructed to have an in-person evaluation today at a local Urgent Care facility, please use the link below. It will take you to a list of all of our available [facility], including address, phone number and hours of operation. Please do not delay care.  [facility]  If you or a family member do not have a primary care provider, use the link below to schedule a visit and establish care. When you choose a [facility] primary care physician or advanced practice provider, you gain a long-term partner in health. Find a Primary Care Provider  Learn more about [facility]'s in-office and virtual care options: [facility] - Get Care Now

## 2023-01-03 NOTE — Telephone Encounter (Signed)
Chief Complaint: "diarrhea", follow up from OV 11/19 Symptoms: loose stools, increased stools (4-6 more than normal) Frequency: Pt reports since 12-13-22 Pertinent Negatives: Patient denies fevers, dizziness, vomiting, dehydration, abdominal pain, weakness, blood in stool, watery stool, abdominal distension Disposition: [] ED /[] Urgent Care (no appt availability in office) / [] Appointment(In office/virtual)/ [x]  [facility]/ [] Home Care/ [] Refused Recommended Disposition /[] [facility]/ []  Follow-up with PCP Additional Notes: Patient states she was seen in office on 11-19 for symptoms. Pt states she tried imodium for 1 week and did not improve, stopped taking med. Pt states she is able to eat and drink; drinking 8 glasses of water daily. Pt requesting to be seen by Dr Lodema Hong. Pt agreeable to be seen today; no OV available today with Dr Lodema Hong. Pt requested virtual visit. Virtual appt made for patient.  Copied from CRM [id number]. Topic: Clinical - Pink Word Triage >> Jan 03, 2023  9:07 AM Colletta Maryland S wrote: Reason for Triage: diarrhea, attempted to call back to transfer pt to nurse triage and no answer Reason for Disposition  MODERATE diarrhea (e.g., 4-6 times / day more than normal)  Answer Assessment - Initial Assessment Questions 1. ANTIBIOTIC: "What antibiotic are you taking?" "How many times per day?"     Clindamycin three times daily, stopped taking.  2. ANTIBIOTIC ONSET: "When was the antibiotic started?"     12/13/22; stopped since.  3. DIARRHEA SEVERITY: "How bad is the diarrhea?" "How many more stools have you had in the past 24 hours than normal?"    - NO DIARRHEA (SCALE 0)   - MILD (SCALE 1-3): Few loose or mushy BMs; increase of 1-3 stools over normal daily number of stools; mild increase in ostomy output.   -  MODERATE (SCALE 4-7): Increase of 4-6 stools daily over normal; moderate increase in ostomy output. * SEVERE (SCALE 8-10; OR 'WORST POSSIBLE'): Increase  of 7 or more stools daily over normal; moderate increase in ostomy output; incontinence.     Moderate  4. ONSET: "When did the diarrhea begin?"      12/13/22  5. BM CONSISTENCY: "How loose or watery is the diarrhea?"      "It's not hard but it's not runny, its mushy" loose stool  6. VOMITING: "Are you also vomiting?" If Yes, ask: "How many times in the past 24 hours?"      No vomiting  7. ABDOMEN PAIN: "Are you having any abdomen pain?" If Yes, ask: "What does it feel like?" (e.g., crampy, dull, intermittent, constant)      No abdominal pain  8. ABDOMEN PAIN SEVERITY: If present, ask: "How bad is the pain?"  (e.g., Scale 1-10; mild, moderate, or severe)   - MILD (1-3): doesn't interfere with normal activities, abdomen soft and not tender to touch    - MODERATE (4-7): interferes with normal activities or awakens from sleep, abdomen tender to touch    - SEVERE (8-10): excruciating pain, doubled over, unable to do any normal activities       Denies pain  9. ORAL INTAKE: If vomiting, "Have you been able to drink liquids?" "How much liquids have you had in the past 24 hours?"     Pt states she has been able to stay hydrated and is drinking 8 glasses of water at least daily  10. HYDRATION: "Any signs of dehydration?" (e.g., dry mouth [not just dry lips], too weak to stand, dizziness, new weight loss) "When did you last urinate?"  Pt denies dehydration, dizziness, feeling too weak to stand. States urinating with no problems  11. EXPOSURE: "Have you traveled to a foreign country recently?" "Have you been exposed to anyone with diarrhea?" "Could you have eaten any food that was spoiled?"       No denies any exposure.  12. OTHER SYMPTOMS: "Do you have any other symptoms?" (e.g., fever, blood in stool)       No fevers  Protocols used: Diarrhea on Antibiotics-A-AH

## 2023-01-03 NOTE — Progress Notes (Signed)
Virtual Visit Consent   Renee Waters, you are scheduled for a virtual visit with a [facility] provider today. Just as with appointments in the office, your consent must be obtained to participate. Your consent will be active for this visit and any virtual visit you may have with one of our providers in the next 365 days. If you have a MyChart account, a copy of this consent can be sent to you electronically.  As this is a virtual visit, video technology does not allow for your provider to perform a traditional examination. This may limit your provider's ability to fully assess your condition. If your provider identifies any concerns that need to be evaluated in person or the need to arrange testing (such as labs, EKG, etc.), we will make arrangements to do so. Although advances in technology are sophisticated, we cannot ensure that it will always work on either your end or our end. If the connection with a video visit is poor, the visit may have to be switched to a telephone visit. With either a video or telephone visit, we are not always able to ensure that we have a secure connection.  By engaging in this virtual visit, you consent to the provision of healthcare and authorize for your insurance to be billed (if applicable) for the services provided during this visit. Depending on your insurance coverage, you may receive a charge related to this service.  I need to obtain your verbal consent now. Are you willing to proceed with your visit today? Renee Waters has provided verbal consent on 01/03/2023 for a virtual visit (video or telephone). Renee Waters, [place]  Date: 01/03/2023 12:43 PM  Virtual Visit via Video Note   I, Renee Waters, connected with  Renee Waters  ([id number], 05-05-1945) on 01/03/23 at 11:45 AM EST by a video-enabled telemedicine application and verified that I am speaking with the correct person using two identifiers.  Location: Patient: Virtual Visit  Location Patient: Home Provider: Virtual Visit Location Provider: Home Office   I discussed the limitations of evaluation and management by telemedicine and the availability of in person appointments. The patient expressed understanding and agreed to proceed.    History of Present Illness: Renee Waters is a 77 y.o. who identifies as a female who was assigned female at birth, and is being seen today for ongoing GI symptoms. Has been experiencing 3.5 weeks of ongoing frequent loose but not runny stool. Was initially evaluated at her PCP office on 11/19 at which time supportive measures were reviewed. Stool studies ordered at that time which patient notes dropping off, but the lab did not run them (shows canceled in EMR). Denies fever, chills, belly pain, nausea or vomiting. Notes horrible fecal urgency and occasional incontinence. Recent antibiotic use within past month.  Notes she is hydrating well and following a bland diet.   HPI: HPI  Problems:  Patient Active Problem List   Diagnosis Date Noted   Diarrhea 12/27/2022   PAC (premature atrial contraction) 12/19/2022   Neuropathic pain of both feet 12/04/2022   Swelling of joint, knee, right 10/05/2022   Aortic regurgitation 09/21/2022   Chronic diastolic heart failure (HCC) 09/21/2022   Mitral regurgitation 09/21/2022   Impaired mobility 08/30/2022   AKI (acute kidney injury) (HCC) 08/30/2022   Warts 08/30/2022   Atrophic vaginitis 08/16/2022   Immunosuppression due to drug therapy (HCC) 08/16/2022   Difficulty voiding 02/28/2022   Urinary incontinence 02/28/2022   Hyperkalemia 02/09/2022   Essential hypertension 02/09/2022  Bronchiectasis assoc with possible tracheomalacia 11/11/2021   Generalized weakness 10/19/2021   Sinus bradycardia 10/19/2021   Hypertensive urgency 10/19/2021   Recurrent UTI 10/19/2021   Transaminitis 10/19/2021   Thrombocytopenia (HCC) 10/19/2021   Fall at home, initial encounter 09/01/2021    Orthostatic hypotension    Hypomagnesemia 08/27/2021   Normocytic anemia 08/26/2021   MPN (myeloproliferative neoplasm) (HCC) 04/21/2021   Anxiety 04/05/2021   Leukocytosis 11/09/2020   CKD (chronic kidney disease) stage 3, GFR 30-59 ml/min (HCC) 10/29/2020   Dermatomycosis 08/02/2020   HSV-2 (herpes simplex virus 2) infection 08/02/2020   Mildly underweight adult 09/19/2019   MDD (major depressive disorder), recurrent episode, moderate (HCC) 10/28/2016   Thoracic spine pain 08/31/2016   Anemia, normocytic normochromic 05/27/2015   IDA (iron deficiency anemia) 03/02/2015   Unsteady gait 02/25/2015   Mucosal abnormality of esophagus    Poor urinary stream 01/16/2013   Systemic lupus (HCC) 10/31/2012   Vitamin D deficiency 03/16/2012   Generalized pain 03/15/2012   Constipation 10/06/2011   Hip pain 06/29/2011   Barrett's esophagus 12/13/2010   Western blot positive HSV2 06/22/2007   Depression, major, single episode, in partial remission (HCC) 06/22/2007   Labile hypertension 06/22/2007   GERD without esophagitis 06/22/2007   DJD (degenerative joint disease) 06/22/2007   NECK PAIN, CHRONIC 06/22/2007   Chronic midline low back pain with sciatica 06/22/2007   Insomnia secondary to depression with anxiety 06/22/2007    Allergies:  Allergies  Allergen Reactions   Bee Venom Swelling and Hives   Norvasc [Amlodipine] Other (See Comments)    Hair loss   Tylenol [Acetaminophen] Itching   Hydralazine Other (See Comments)    Hair loss   Red Dye #40 (Allura Red) Itching   Percocet [Oxycodone-Acetaminophen] Rash and Other (See Comments)    Pt states, "this gives her a rash, but at home she takes oxycodone for pain relief"    Tyloxapol Itching and Rash   Medications:  Current Outpatient Medications:    acyclovir (ZOVIRAX) 800 MG tablet, Take 1 tablet (800 mg total) by mouth 5 (five) times daily as needed., Disp: 30 tablet, Rfl: 0   allopurinol (ZYLOPRIM) 100 MG tablet, Take 2  tablets by mouth once daily, Disp: 60 tablet, Rfl: 0   cetirizine (ZYRTEC) 10 MG tablet, Take 10 mg by mouth daily., Disp: , Rfl:    cholecalciferol (CHOLECALCIFEROL) 25 MCG tablet, Take 1 tablet (1,000 Units total) by mouth daily., Disp: , Rfl:    clindamycin (CLEOCIN) 300 MG capsule, Take 300 mg by mouth 3 (three) times daily. (Patient not taking: Reported on 12/27/2022), Disp: , Rfl:    EPINEPHrine 0.3 mg/0.3 mL IJ SOAJ injection, INJECT 0.3 MLS INTO MUSCLE ONCE AS NEEDED, Disp: 1 each, Rfl: 2   hydrOXYzine (ATARAX) 50 MG tablet, Take 1 tablet by mouth three times daily as needed, Disp: 90 tablet, Rfl: 0   lidocaine-prilocaine (EMLA) cream, Apply 1 Application topically as needed (Access to port and pain)., Disp: 30 g, Rfl: 6   metoprolol tartrate (LOPRESSOR) 25 MG tablet, Take 0.5 tablets (12.5 mg total) by mouth 2 (two) times daily., Disp: , Rfl:    mirtazapine (REMERON) 15 MG tablet, Take 15 mg by mouth at bedtime., Disp: , Rfl:    momelotinib dihydrochloride (OJJAARA) 100 MG tablet, Take 1 tablet (100 mg total) by mouth daily., Disp: 30 tablet, Rfl: 3   Multiple Vitamin (MULTIVITAMIN WITH MINERALS) TABS tablet, Take 1 tablet by mouth daily., Disp: 100 tablet, Rfl: 1  naloxegol oxalate (MOVANTIK) 25 MG TABS tablet, Take 1 tablet (25 mg total) by mouth daily., Disp: 30 tablet, Rfl: 11   ondansetron (ZOFRAN-ODT) 8 MG disintegrating tablet, Take 1 tablet (8 mg total) by mouth every 8 (eight) hours as needed for nausea or vomiting., Disp: 20 tablet, Rfl: 0   oxyCODONE (ROXICODONE) 15 MG immediate release tablet, Take 15 mg by mouth every 6 (six) hours as needed., Disp: , Rfl:    pantoprazole (PROTONIX) 40 MG tablet, Take 1 tablet (40 mg total) by mouth 2 (two) times daily before a meal., Disp: 60 tablet, Rfl: 11   potassium chloride (KLOR-CON) 10 MEQ tablet, Take 1 tablet by mouth once daily, Disp: 30 tablet, Rfl: 0   pregabalin (LYRICA) 25 MG capsule, Take 1 capsule (25 mg total) by mouth daily.,  Disp: 30 capsule, Rfl: 3   prochlorperazine (COMPAZINE) 10 MG tablet, TAKE 1 TABLET BY MOUTH EVERY 6 HOURS AS NEEDED FOR NAUSEA OR VOMITING, Disp: 60 tablet, Rfl: 0   temazepam (RESTORIL) 30 MG capsule, Take 1 capsule (30 mg total) by mouth at bedtime., Disp: 30 capsule, Rfl: 5   triamcinolone ointment (KENALOG) 0.5 %, Apply topically daily., Disp: , Rfl:    trimethoprim (TRIMPEX) 100 MG tablet, Take 1 tablet (100 mg total) by mouth daily., Disp: 30 tablet, Rfl: 11 No current facility-administered medications for this visit.  Facility-Administered Medications Ordered in Other Visits:    lanreotide acetate (SOMATULINE DEPOT) 120 MG/0.5ML injection, , , ,    lanreotide acetate (SOMATULINE DEPOT) 120 MG/0.5ML injection, , , ,    lanreotide acetate (SOMATULINE DEPOT) 120 MG/0.5ML injection, , , ,    octreotide (SANDOSTATIN LAR) 30 MG IM injection, , , ,    octreotide (SANDOSTATIN LAR) 30 MG IM injection, , , ,   Observations/Objective: Patient is well-developed, well-nourished in no acute distress.  Resting comfortably at home.  Head is normocephalic, atraumatic.  No labored breathing. Speech is clear and coherent with logical content.  Patient is alert and oriented at baseline.   Assessment and Plan: 1. Diarrhea, unspecified type  Was able to speak with PCP office regarding testing. They are having her come back in for recollection so they can be submitted and will follow-up with patient regarding management/next steps.   No charge.   Follow Up Instructions: I discussed the assessment and treatment plan with the patient. The patient was provided an opportunity to ask questions and all were answered. The patient agreed with the plan and demonstrated an understanding of the instructions.  A copy of instructions were sent to the patient via MyChart unless otherwise noted below.   The patient was advised to call back or seek an in-person evaluation if the symptoms worsen or if the condition  fails to improve as anticipated.    Renee Climes, PA-C

## 2023-01-04 ENCOUNTER — Other Ambulatory Visit: Payer: Self-pay | Admitting: Hematology

## 2023-01-07 NOTE — Progress Notes (Signed)
Please inform patient   Stool specimen canceled not enough stool sample given

## 2023-01-09 DIAGNOSIS — N179 Acute kidney failure, unspecified: Secondary | ICD-10-CM | POA: Diagnosis not present

## 2023-01-09 DIAGNOSIS — D631 Anemia in chronic kidney disease: Secondary | ICD-10-CM | POA: Diagnosis not present

## 2023-01-09 DIAGNOSIS — F419 Anxiety disorder, unspecified: Secondary | ICD-10-CM | POA: Diagnosis not present

## 2023-01-09 DIAGNOSIS — I129 Hypertensive chronic kidney disease with stage 1 through stage 4 chronic kidney disease, or unspecified chronic kidney disease: Secondary | ICD-10-CM | POA: Diagnosis not present

## 2023-01-09 DIAGNOSIS — F32A Depression, unspecified: Secondary | ICD-10-CM | POA: Diagnosis not present

## 2023-01-09 DIAGNOSIS — G4700 Insomnia, unspecified: Secondary | ICD-10-CM | POA: Diagnosis not present

## 2023-01-09 DIAGNOSIS — Z87891 Personal history of nicotine dependence: Secondary | ICD-10-CM | POA: Diagnosis not present

## 2023-01-09 DIAGNOSIS — I700 Atherosclerosis of aorta: Secondary | ICD-10-CM | POA: Diagnosis not present

## 2023-01-09 DIAGNOSIS — G8929 Other chronic pain: Secondary | ICD-10-CM | POA: Diagnosis not present

## 2023-01-09 DIAGNOSIS — M1990 Unspecified osteoarthritis, unspecified site: Secondary | ICD-10-CM | POA: Diagnosis not present

## 2023-01-09 DIAGNOSIS — M329 Systemic lupus erythematosus, unspecified: Secondary | ICD-10-CM | POA: Diagnosis not present

## 2023-01-09 DIAGNOSIS — D630 Anemia in neoplastic disease: Secondary | ICD-10-CM | POA: Diagnosis not present

## 2023-01-09 DIAGNOSIS — C946 Myelodysplastic disease, not classified: Secondary | ICD-10-CM | POA: Diagnosis not present

## 2023-01-09 DIAGNOSIS — N184 Chronic kidney disease, stage 4 (severe): Secondary | ICD-10-CM | POA: Diagnosis not present

## 2023-01-09 DIAGNOSIS — J439 Emphysema, unspecified: Secondary | ICD-10-CM | POA: Diagnosis not present

## 2023-01-09 DIAGNOSIS — Z9181 History of falling: Secondary | ICD-10-CM | POA: Diagnosis not present

## 2023-01-09 DIAGNOSIS — Z87440 Personal history of urinary (tract) infections: Secondary | ICD-10-CM | POA: Diagnosis not present

## 2023-01-09 DIAGNOSIS — Z556 Problems related to health literacy: Secondary | ICD-10-CM | POA: Diagnosis not present

## 2023-01-09 DIAGNOSIS — M519 Unspecified thoracic, thoracolumbar and lumbosacral intervertebral disc disorder: Secondary | ICD-10-CM | POA: Diagnosis not present

## 2023-01-09 DIAGNOSIS — K2270 Barrett's esophagus without dysplasia: Secondary | ICD-10-CM | POA: Diagnosis not present

## 2023-01-09 DIAGNOSIS — M542 Cervicalgia: Secondary | ICD-10-CM | POA: Diagnosis not present

## 2023-01-11 DIAGNOSIS — D630 Anemia in neoplastic disease: Secondary | ICD-10-CM | POA: Diagnosis not present

## 2023-01-11 DIAGNOSIS — C946 Myelodysplastic disease, not classified: Secondary | ICD-10-CM | POA: Diagnosis not present

## 2023-01-11 DIAGNOSIS — D631 Anemia in chronic kidney disease: Secondary | ICD-10-CM | POA: Diagnosis not present

## 2023-01-11 DIAGNOSIS — N184 Chronic kidney disease, stage 4 (severe): Secondary | ICD-10-CM | POA: Diagnosis not present

## 2023-01-11 DIAGNOSIS — N179 Acute kidney failure, unspecified: Secondary | ICD-10-CM | POA: Diagnosis not present

## 2023-01-11 DIAGNOSIS — I129 Hypertensive chronic kidney disease with stage 1 through stage 4 chronic kidney disease, or unspecified chronic kidney disease: Secondary | ICD-10-CM | POA: Diagnosis not present

## 2023-01-12 ENCOUNTER — Encounter: Payer: Self-pay | Admitting: Internal Medicine

## 2023-01-13 ENCOUNTER — Other Ambulatory Visit: Payer: Self-pay | Admitting: Oncology

## 2023-01-13 ENCOUNTER — Other Ambulatory Visit: Payer: Self-pay | Admitting: Hematology

## 2023-01-13 MED ORDER — PREDNISONE 10 MG PO TABS: 20.0000 mg | ORAL_TABLET | Freq: Every day | ORAL | 0 refills | Status: DC

## 2023-01-17 ENCOUNTER — Other Ambulatory Visit: Payer: Self-pay

## 2023-01-17 NOTE — Progress Notes (Signed)
Specialty Pharmacy Refill Coordination Note  Renee Waters is a 77 y.o. female contacted today regarding refills of specialty medication(s) Momelotinib Dihydrochloride   Patient requested Delivery   Delivery date: 01/25/23   Verified address: [address]   [place] [address]   Medication will be filled on 01/24/23.

## 2023-01-18 DIAGNOSIS — H532 Diplopia: Secondary | ICD-10-CM | POA: Diagnosis not present

## 2023-01-18 DIAGNOSIS — H524 Presbyopia: Secondary | ICD-10-CM | POA: Diagnosis not present

## 2023-01-23 DIAGNOSIS — Z515 Encounter for palliative care: Secondary | ICD-10-CM | POA: Diagnosis not present

## 2023-01-23 DIAGNOSIS — D47Z9 Other specified neoplasms of uncertain behavior of lymphoid, hematopoietic and related tissue: Secondary | ICD-10-CM | POA: Diagnosis not present

## 2023-01-24 ENCOUNTER — Other Ambulatory Visit: Payer: Self-pay

## 2023-01-25 LAB — GI PROFILE, STOOL, PCR

## 2023-01-25 LAB — SPECIMEN STATUS REPORT

## 2023-01-26 ENCOUNTER — Inpatient Hospital Stay: Payer: Medicare Other | Attending: Hematology | Admitting: Hematology

## 2023-01-26 ENCOUNTER — Inpatient Hospital Stay: Payer: Medicare Other

## 2023-01-26 VITALS — BP 171/68 | HR 65 | Temp 98.1°F | Resp 18 | Ht 65.0 in | Wt 124.0 lb

## 2023-01-26 DIAGNOSIS — Z87891 Personal history of nicotine dependence: Secondary | ICD-10-CM | POA: Insufficient documentation

## 2023-01-26 DIAGNOSIS — D471 Chronic myeloproliferative disease: Secondary | ICD-10-CM

## 2023-01-26 DIAGNOSIS — N189 Chronic kidney disease, unspecified: Secondary | ICD-10-CM | POA: Insufficient documentation

## 2023-01-26 DIAGNOSIS — L299 Pruritus, unspecified: Secondary | ICD-10-CM | POA: Insufficient documentation

## 2023-01-26 DIAGNOSIS — E876 Hypokalemia: Secondary | ICD-10-CM | POA: Diagnosis not present

## 2023-01-26 DIAGNOSIS — R7402 Elevation of levels of lactic acid dehydrogenase (LDH): Secondary | ICD-10-CM | POA: Diagnosis not present

## 2023-01-26 DIAGNOSIS — Z95828 Presence of other vascular implants and grafts: Secondary | ICD-10-CM

## 2023-01-26 LAB — COMPREHENSIVE METABOLIC PANEL
ALT: 19 U/L (ref 0–44)
AST: 21 U/L (ref 15–41)
Albumin: 3.7 g/dL (ref 3.5–5.0)
Alkaline Phosphatase: 101 U/L (ref 38–126)
Anion gap: 9 (ref 5–15)
BUN: 29 mg/dL — ABNORMAL HIGH (ref 8–23)
CO2: 23 mmol/L (ref 22–32)
Calcium: 8.9 mg/dL (ref 8.9–10.3)
Chloride: 106 mmol/L (ref 98–111)
Creatinine, Ser: 1.49 mg/dL — ABNORMAL HIGH (ref 0.44–1.00)
GFR, Estimated: 36 mL/min — ABNORMAL LOW (ref >60)
Glucose, Bld: 106 mg/dL — ABNORMAL HIGH (ref 70–99)
Potassium: 4.1 mmol/L (ref 3.5–5.1)
Sodium: 138 mmol/L (ref 135–145)
Total Bilirubin: 0.6 mg/dL (ref <1.2)
Total Protein: 7.5 g/dL (ref 6.5–8.1)

## 2023-01-26 LAB — CBC WITH DIFFERENTIAL/PLATELET
Abs Immature Granulocytes: 2.5 10*3/uL — ABNORMAL HIGH (ref 0.00–0.07)
Band Neutrophils: 7 %
Basophils Absolute: 0.2 10*3/uL — ABNORMAL HIGH (ref 0.0–0.1)
Basophils Relative: 1 %
Eosinophils Absolute: 0 10*3/uL (ref 0.0–0.5)
Eosinophils Relative: 0 %
HCT: 28.6 % — ABNORMAL LOW (ref 36.0–46.0)
Hemoglobin: 9 g/dL — ABNORMAL LOW (ref 12.0–15.0)
Lymphocytes Relative: 8 %
Lymphs Abs: 1.6 10*3/uL (ref 0.7–4.0)
MCH: 31 pg (ref 26.0–34.0)
MCHC: 31.5 g/dL (ref 30.0–36.0)
MCV: 98.6 fL (ref 80.0–100.0)
Metamyelocytes Relative: 6 %
Monocytes Absolute: 1.2 10*3/uL — ABNORMAL HIGH (ref 0.1–1.0)
Monocytes Relative: 6 %
Myelocytes: 6 %
Neutro Abs: 15 10*3/uL — ABNORMAL HIGH (ref 1.7–7.7)
Neutrophils Relative %: 66 %
Platelets: 193 10*3/uL (ref 150–400)
RBC: 2.9 MIL/uL — ABNORMAL LOW (ref 3.87–5.11)
RDW: 18.1 % — ABNORMAL HIGH (ref 11.5–15.5)
WBC: 20.5 10*3/uL — ABNORMAL HIGH (ref 4.0–10.5)
nRBC: 0.7 % — ABNORMAL HIGH (ref 0.0–0.2)

## 2023-01-26 LAB — SAMPLE TO BLOOD BANK

## 2023-01-26 LAB — LACTATE DEHYDROGENASE: LDH: 260 U/L — ABNORMAL HIGH (ref 98–192)

## 2023-01-26 MED ORDER — HEPARIN SOD (PORK) LOCK FLUSH 100 UNIT/ML IV SOLN
500.0000 [IU] | Freq: Once | INTRAVENOUS | Status: AC
  Administered 2023-01-26: 500 [IU] via INTRAVENOUS

## 2023-01-26 MED ORDER — SODIUM CHLORIDE 0.9% FLUSH
10.0000 mL | INTRAVENOUS | Status: DC | PRN
  Administered 2023-01-26: 10 mL via INTRAVENOUS

## 2023-01-26 NOTE — Patient Instructions (Addendum)
[facility] at [facility] Discharge Instructions   You were seen and examined today by Dr. Ellin Saba.  He reviewed the results of your lab work which are normal/stable.   Continue Ojjarra as prescribed.   Return as scheduled.    Thank you for choosing [facility] at [facility] to provide your oncology and hematology care.  To afford each patient quality time with our provider, please arrive at least 15 minutes before your scheduled appointment time.   If you have a lab appointment with the Cancer Center please come in thru the Main Entrance and check in at the main information desk.  You need to re-schedule your appointment should you arrive 10 or more minutes late.  We strive to give you quality time with our providers, and arriving late affects you and other patients whose appointments are after yours.  Also, if you no show three or more times for appointments you may be dismissed from the clinic at the providers discretion.     Again, thank you for choosing [facility].  Our hope is that these requests will decrease the amount of time that you wait before being seen by our physicians.       _____________________________________________________________  Should you have questions after your visit to [facility], please contact our office at [phone] and follow the prompts.  Our office hours are 8:00 a.m. and 4:30 p.m. Monday - Friday.  Please note that voicemails left after 4:00 p.m. may not be returned until the following business day.  We are closed weekends and major holidays.  You do have access to a nurse 24-7, just call the main number to the clinic [phone] and do not press any options, hold on the line and a nurse will answer the phone.    For prescription refill requests, have your pharmacy contact our office and allow 72 hours.    Due to Covid, you will need to wear a mask upon entering the  hospital. If you do not have a mask, a mask will be given to you at the Main Entrance upon arrival. For doctor visits, patients may have 1 support person age 55 or older with them. For treatment visits, patients can not have anyone with them due to social distancing guidelines and our immunocompromised population.

## 2023-01-26 NOTE — Progress Notes (Signed)
Patients port flushed without difficulty.  Good blood return noted with no bruising or swelling noted at site.  Band aid applied.  VSS with discharge and left in satisfactory condition with no s/s of distress noted.   

## 2023-01-26 NOTE — Progress Notes (Signed)
[facility] [address], [place] [address]    Clinic Day:  01/26/2023  Referring physician: Kerri Perches, MD  Patient Care Team: Kerri Perches, MD as PCP - General Mallipeddi, Orion Modest, MD as PCP - Cardiology (Cardiology) Jena Gauss Gerrit Friends, MD (Gastroenterology) Vickki Hearing, MD as Consulting Physician (Orthopedic Surgery) [name], MD as Medical Oncologist (Oncology) Therese Sarah, RN as Oncology Nurse Navigator (Oncology) Clinton Gallant, RN as [facility] Care Management   ASSESSMENT & PLAN:   Assessment: 1. MPL positive prefibrotic/early primary myelofibrosis: - CBC on 11/06/2020 with white count 75.6, differential 59% neutrophils, 12% monocytes, 4% lymphocytes, 1 percentage of eosinophils and basophils, 6% band neutrophils, 12% myelocytes, 1% promyelocytes, 29% blasts. - Pathologist review of blood smear reported as leukoerythroblastic reaction. - 25 pound weight loss in the last 6 months, unintentional.  Decreased appetite.  Reports fatigue for the last few months.  Reports night sweats x3 in the last 1 month. - Bone marrow biopsy on 11/18/2020 consistent with granulocytic proliferation with differential including CMML versus myeloproliferative disorder. - BCR/ABL was negative. 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 - NGS myeloid panel shows mutations in ASXL1, MPL, TET2, EZH2 mutations. - Spleen ultrasound on 12/16/2020 shows mildly enlarged measuring 12.4 x 9.6 x 7 cm with volume of 435 cc. - PDGFR alpha, beta and FGFR 1 was negative. - She was evaluated by Dr. Lowell Guitar at [facility].  Slides were reviewed at [facility] hematopathology.  They thought it was less likely CMML and more likely MPL positive myeloproliferative neoplasm.  Dr. Lowell Guitar has recommended initiate Jakafi. - Given anemia, B symptoms, cytogenetics with MPL, ASLX1, normal karyotype she is intermediate  risk on GIPPS at high risk MIPSS70+ - Ruxolitinib 10 mg twice daily started on 02/16/2021.  Dose increased to 15 mg twice daily on 05/18/2021. - CTAP on 04/07/2021: Hepatomegaly and splenomegaly measuring 14.3 cm.  No other pathology.  No spleen infarcts. -Ruxolitinib dose increased to 15 mg twice daily on 09/20/2021. - Ultrasound spleen (09/21/2021): 13.6 cm with volume 532 mL.  This is slightly improved from previous volume. - Bone marrow biopsy (09/09/2021): Material is limited but overall features consistent with myeloid neoplasm.  No increase in blasts.  Erythroid precursors appear decreased but megakaryocytes are variably evident with clusters and small forms.  Reticulin's shows variable increase in reticulin fibers including areas with prominent increase.  Chromosome analysis was normal. - Tapering doses of ruxolitinib started on 12/24/2021, last dose on 01/23/2022 - Evaluated by Dr. Lowell Guitar on 12/24/2021. - Momelotinib 100 mg daily started on 01/26/2022, dose increased to 150 mg daily on 07/19/2022 due to generalized itching.  Dose decreased to 100 mg daily on 08/09/2022.  2.  Social/family history: - She lives at home and is able to do all her ADLs and IADLs although she is getting tired lately.  She reports quitting smoking 6 months ago and smoked 1 pack/week for 43 years. - She believes that her mother had some kind of leukemia.  No other malignancies.    Plan: MPL positive myeloproliferative neoplasm: -She is tolerating momelotinib very well. - Spleen ultrasound in September showed spleen volume reduced by 50%. - Physical exam: Spleen palpable 2 fingerbreadths below left costal margin on deep inspiration. - Labs today: LFTs are normal.  Creatinine is 1.49.  LDH is elevated at 260.  Hemoglobin is 9 and platelet count is normal.  White count is 20.5 and  stable. - She is remaining transfusion free.  Will continue to monitor CBC every 4 weeks.  RTC 12 weeks for follow-up with repeat labs.   2.   Decreased appetite: - She is continuing to eat better and gained about 2 pounds.  3.  CKD: - Creatinine is 1.49 and stable.  4.  Hypokalemia: - Continue potassium daily.  Potassium is 4.1.   5.  Generalized itching: - Itching has improved since momelotinib started.    No orders of the defined types were placed in this encounter.     [name],acting as a Neurosurgeon for [name], MD.,have documented all relevant documentation on the behalf of [name], MD,as directed by  [name], MD while in the presence of [name], MD.  I, [name] MD, have reviewed the above documentation for accuracy and completeness, and I agree with the above.    [name], MD   12/19/20245:06 PM  CHIEF COMPLAINT:   Diagnosis: MPL positive myeloproliferative neoplasm    Cancer Staging  No matching staging information was found for the patient.    Prior Therapy: Jakafi 10 mg twice daily   Current Therapy:  Momelotinib 100 mg daily    HISTORY OF PRESENT ILLNESS:   Oncology History   No history exists.     INTERVAL HISTORY:   Renee Waters is a 77 y.o. female presenting to clinic today for follow up of MPL positive myeloproliferative neoplasm. She was last seen by me on 11/28/22.  Today, she states that she is doing well overall. Her appetite level is at 90%. Her energy level is at 50%. She is accompanied by her nephew. She denies any nausea or vomiting. Her appetite has improved and her generalized itching has resolved. She has gained 2 pounds in the last month.  PAST MEDICAL HISTORY:   Past Medical History: Past Medical History:  Diagnosis Date   Acute cholangitis    Acute respiratory failure with hypoxia (HCC) 09/01/2021   Allergy    Anemia    Anxiety    Arthritis    Phreesia 03/18/2020   Barrett's esophagus    Cataract    Chronic back pain    Chronic neck pain    CKD (chronic kidney disease) stage 3, GFR 30-59  ml/min (HCC) 10/29/2020   Depression    Genital herpes    GERD (gastroesophageal reflux disease)    H/O degenerative disc disease    History of blood transfusion    Hypertension    Hypoxia 12/01/2021   Insomnia    Lupus (systemic lupus erythematosus) (HCC)    Neuromuscular disorder (HCC)    Osteoarthritis    S/P colonoscopy 07/2003   normal, no polyps   S/P endoscopy June 2005, Oct 2009   2005: short-segment Barrett's, 2009: short-segment Barrett's   Upper respiratory tract infection 07/09/2020   UTI (lower urinary tract infection) 11/2012    Surgical History: Past Surgical History:  Procedure Laterality Date   ABDOMINAL HYSTERECTOMY     BACK SURGERY     BIOPSY N/A 03/20/2014   Procedure: BIOPSY;  Surgeon: Corbin Ade, MD;  Location: [facility];  Service: Endoscopy;  Laterality: N/A;   BIOPSY  09/14/2015   Procedure: BIOPSY;  Surgeon: Corbin Ade, MD;  Location: [facility];  Service: Endoscopy;;  esophageal and gastric   BIOPSY  07/23/2019   Procedure: BIOPSY;  Surgeon: Corbin Ade, MD;  Location: [facility];  Service: Endoscopy;;  esophageal    CARPAL TUNNEL  RELEASE Left 2013   cervical disectomy  2002   CESAREAN SECTION N/A    Phreesia 03/18/2020   CHOLECYSTECTOMY     with lysis of adhesions for sbo; "ruptured gallbladder".   COLONOSCOPY  11/09/2011   RMR: Melanosis coli   COLONOSCOPY WITH PROPOFOL N/A 09/14/2015   Dr. Jena Gauss: diverticulosis, 3mm TA removed. next TCS 09/2020.    COLONOSCOPY WITH PROPOFOL N/A 04/30/2020   Procedure: COLONOSCOPY WITH PROPOFOL;  Surgeon: Corbin Ade, MD;  Location: [facility];  Service: Endoscopy;  Laterality: N/A;  PM (ASA 3)   DENTAL SURGERY  11/2015   multiple tooth extraction   ESOPHAGOGASTRODUODENOSCOPY  11/29/2007   salmon-colored  tongue   longest stable at  3 cm, distal esophagus as described previously status post biopsy/ Hiatal hernia, otherwise normal stomach D1 and D2   ESOPHAGOGASTRODUODENOSCOPY  01/06/11   short  segment Barrett's esophagus s/p bx/Hiatal hernia   ESOPHAGOGASTRODUODENOSCOPY (EGD) WITH PROPOFOL N/A 03/20/2014   OVF:IEPPIRJJ distal esophagus short segment barrett's, bx with no dysplasia. next egd in 03/2017   ESOPHAGOGASTRODUODENOSCOPY (EGD) WITH PROPOFOL N/A 09/14/2015   Dr. Jena Gauss: Barrett's without dysplasia, gastritis benign bx, hiatal hernia. next EGD 09/2018.   ESOPHAGOGASTRODUODENOSCOPY (EGD) WITH PROPOFOL N/A 07/23/2019   Procedure: ESOPHAGOGASTRODUODENOSCOPY (EGD) WITH PROPOFOL;  Surgeon: Corbin Ade, MD;  Location: [facility];  Service: Endoscopy;  Laterality: N/A;  3:00pm   EYE SURGERY N/A    Phreesia 03/18/2020   HERNIA REPAIR Right 07/2010   Dr. Arlean Hopping   IR IMAGING GUIDED PORT INSERTION  02/09/2021   JOINT REPLACEMENT     LAPAROSCOPIC CHOLECYSTECTOMY  2017   at [facility]   POLYPECTOMY  09/14/2015   Procedure: POLYPECTOMY;  Surgeon: Corbin Ade, MD;  Location: [facility];  Service: Endoscopy;;  ascending colon   right hip replacement  07/2010   went back in sept 2012 to fix   SHOULDER ARTHROSCOPY  2008   left   SPINE SURGERY N/A    Phreesia 03/18/2020   TOTAL HIP REVISION Right 12/17/2012   Procedure: RIGHT TOTAL HIP REVISION;  Surgeon: [name], MD;  Location: [facility];  Service: Orthopedics;  Laterality: Right;   WRIST SURGERY Right 2011   open reduction right wrist.    Social History: Social History   Socioeconomic History   Marital status: Married    Spouse name: louis   Number of children: 4   Years of education: 12+   Highest education level: Some college, no degree  Occupational History   Occupation: Audiological scientist  - retired   Occupation: non profit  Tobacco Use   Smoking status: Former    Current packs/day: 0.00    Average packs/day: 0.3 packs/day for 25.0 years (6.3 ttl pk-yrs)    Types: Cigarettes    Start date: 02/06/1978    Quit date: 02/07/2003    Years since quitting: 19.9   Smokeless tobacco: Never   Tobacco comments:    quit in 2004   Vaping Use   Vaping status: Never Used  Substance and Sexual Activity   Alcohol use: No   Drug use: No   Sexual activity: Yes    Birth control/protection: Surgical  Other Topics Concern   Not on file  Social History Narrative   Not on file   Social Drivers of Health   Financial Resource Strain: Low Risk  (12/28/2020)   Overall Financial Resource Strain (CARDIA)    Difficulty of Paying Living Expenses: Not hard at all  Food  Insecurity: No Food Insecurity (08/01/2022)   Hunger Vital Sign    Worried About Running Out of Food in the Last Year: Never true    Ran Out of Food in the Last Year: Never true  Transportation Needs: No Transportation Needs (08/01/2022)   PRAPARE - Administrator, Civil Service (Medical): No    Lack of Transportation (Non-Medical): No  Physical Activity: Inactive (04/23/2021)   Exercise Vital Sign    Days of Exercise per Week: 0 days    Minutes of Exercise per Session: 0 min  Stress: Stress Concern Present (04/23/2021)   [employer] of Occupational Health - Occupational Stress Questionnaire    Feeling of Stress : Rather much  Social Connections: Moderately Integrated (12/28/2020)   Social Connection and Isolation Panel [NHANES]    Frequency of Communication with Friends and Family: More than three times a week    Frequency of Social Gatherings with Friends and Family: Once a week    Attends Religious Services: More than 4 times per year    Active Member of [employer] or Organizations: No    Attends Banker Meetings: Never    Marital Status: Married  Catering manager Violence: Not At Risk (07/26/2022)   Humiliation, Afraid, Rape, and Kick questionnaire    Fear of Current or Ex-Partner: No    Emotionally Abused: No    Physically Abused: No    Sexually Abused: No    Family History: Family History  Problem Relation Age of Onset   Hypertension Mother    Stroke Mother    Colon cancer Neg Hx    Anesthesia problems Neg Hx     Hypotension Neg Hx    Malignant hyperthermia Neg Hx    Pseudochol deficiency Neg Hx    Gastric cancer Neg Hx    Esophageal cancer Neg Hx     Current Medications:  Current Outpatient Medications:    acyclovir (ZOVIRAX) 800 MG tablet, Take 1 tablet (800 mg total) by mouth 5 (five) times daily as needed., Disp: 30 tablet, Rfl: 0   allopurinol (ZYLOPRIM) 100 MG tablet, Take 2 tablets by mouth once daily, Disp: 60 tablet, Rfl: 0   cetirizine (ZYRTEC) 10 MG tablet, Take 10 mg by mouth daily., Disp: , Rfl:    cholecalciferol (CHOLECALCIFEROL) 25 MCG tablet, Take 1 tablet (1,000 Units total) by mouth daily., Disp: , Rfl:    clindamycin (CLEOCIN) 300 MG capsule, Take 300 mg by mouth 3 (three) times daily., Disp: , Rfl:    EPINEPHrine 0.3 mg/0.3 mL IJ SOAJ injection, INJECT 0.3 MLS INTO MUSCLE ONCE AS NEEDED, Disp: 1 each, Rfl: 2   hydrOXYzine (ATARAX) 50 MG tablet, Take 1 tablet by mouth three times daily as needed, Disp: 90 tablet, Rfl: 0   lidocaine-prilocaine (EMLA) cream, Apply 1 Application topically as needed (Access to port and pain)., Disp: 30 g, Rfl: 6   metoprolol tartrate (LOPRESSOR) 25 MG tablet, Take 0.5 tablets (12.5 mg total) by mouth 2 (two) times daily., Disp: , Rfl:    mirtazapine (REMERON) 15 MG tablet, Take 15 mg by mouth at bedtime., Disp: , Rfl:    momelotinib dihydrochloride (OJJAARA) 100 MG tablet, Take 1 tablet (100 mg total) by mouth daily., Disp: 30 tablet, Rfl: 3   Multiple Vitamin (MULTIVITAMIN WITH MINERALS) TABS tablet, Take 1 tablet by mouth daily., Disp: 100 tablet, Rfl: 1   naloxegol oxalate (MOVANTIK) 25 MG TABS tablet, Take 1 tablet (25 mg total) by mouth daily., Disp: 30 tablet,  Rfl: 11   ondansetron (ZOFRAN-ODT) 8 MG disintegrating tablet, Take 1 tablet (8 mg total) by mouth every 8 (eight) hours as needed for nausea or vomiting., Disp: 20 tablet, Rfl: 0   oxyCODONE (ROXICODONE) 15 MG immediate release tablet, Take 15 mg by mouth every 6 (six) hours as needed.,  Disp: , Rfl:    pantoprazole (PROTONIX) 40 MG tablet, Take 1 tablet (40 mg total) by mouth 2 (two) times daily before a meal., Disp: 60 tablet, Rfl: 11   potassium chloride (KLOR-CON) 10 MEQ tablet, Take 1 tablet by mouth once daily, Disp: 30 tablet, Rfl: 0   predniSONE (DELTASONE) 10 MG tablet, Take 2 tablets (20 mg total) by mouth daily with breakfast., Disp: 10 tablet, Rfl: 0   pregabalin (LYRICA) 25 MG capsule, Take 1 capsule (25 mg total) by mouth daily., Disp: 30 capsule, Rfl: 3   prochlorperazine (COMPAZINE) 10 MG tablet, TAKE 1 TABLET BY MOUTH EVERY 6 HOURS AS NEEDED FOR NAUSEA OR VOMITING, Disp: 60 tablet, Rfl: 0   temazepam (RESTORIL) 30 MG capsule, Take 1 capsule (30 mg total) by mouth at bedtime., Disp: 30 capsule, Rfl: 5   triamcinolone ointment (KENALOG) 0.5 %, Apply topically daily., Disp: , Rfl:    trimethoprim (TRIMPEX) 100 MG tablet, Take 1 tablet (100 mg total) by mouth daily., Disp: 30 tablet, Rfl: 11 No current facility-administered medications for this visit.  Facility-Administered Medications Ordered in Other Visits:    lanreotide acetate (SOMATULINE DEPOT) 120 MG/0.5ML injection, , , ,    lanreotide acetate (SOMATULINE DEPOT) 120 MG/0.5ML injection, , , ,    lanreotide acetate (SOMATULINE DEPOT) 120 MG/0.5ML injection, , , ,    octreotide (SANDOSTATIN LAR) 30 MG IM injection, , , ,    octreotide (SANDOSTATIN LAR) 30 MG IM injection, , , ,    Allergies: Allergies  Allergen Reactions   Bee Venom Swelling and Hives   Norvasc [Amlodipine] Other (See Comments)    Hair loss   Tylenol [Acetaminophen] Itching   Hydralazine Other (See Comments)    Hair loss   Red Dye #40 (Allura Red) Itching   Percocet [Oxycodone-Acetaminophen] Rash and Other (See Comments)    Pt states, "this gives her a rash, but at home she takes oxycodone for pain relief"    Tyloxapol Itching and Rash    REVIEW OF SYSTEMS:   Review of Systems  Constitutional:  Negative for chills, fatigue and  fever.  HENT:   Negative for lump/mass, mouth sores, nosebleeds, sore throat and trouble swallowing.   Eyes:  Negative for eye problems.  Respiratory:  Negative for cough and shortness of breath.   Cardiovascular:  Negative for chest pain, leg swelling and palpitations.  Gastrointestinal:  Negative for abdominal pain, constipation, diarrhea, nausea and vomiting.  Genitourinary:  Negative for bladder incontinence, difficulty urinating, dysuria, frequency, hematuria and nocturia.   Musculoskeletal:  Positive for arthralgias (in hips, 7/10 severity) and back pain (7/10 severity). Negative for flank pain, myalgias and neck pain.  Skin:  Negative for itching and rash.  Neurological:  Negative for dizziness, headaches and numbness.  Hematological:  Does not bruise/bleed easily.  Psychiatric/Behavioral:  Negative for depression, sleep disturbance and suicidal ideas. The patient is not nervous/anxious.   All other systems reviewed and are negative.    VITALS:   Blood pressure (!) 171/68, pulse 65, temperature 98.1 F (36.7 C), temperature source Tympanic, resp. rate 18, height 5\' 5"  (1.651 m), weight 124 lb (56.2 kg), SpO2 100%.  Wt Readings  from Last 3 Encounters:  01/26/23 124 lb (56.2 kg)  12/29/22 122 lb (55.3 kg)  12/27/22 122 lb 1.9 oz (55.4 kg)    Body mass index is 20.63 kg/m.  Performance status (ECOG): 1 - Symptomatic but completely ambulatory  PHYSICAL EXAM:   Physical Exam Vitals and nursing note reviewed. Exam conducted with a chaperone present.  Constitutional:      Appearance: Normal appearance.  Cardiovascular:     Rate and Rhythm: Normal rate and regular rhythm.     Pulses: Normal pulses.     Heart sounds: Normal heart sounds.  Pulmonary:     Effort: Pulmonary effort is normal.     Breath sounds: Normal breath sounds.  Abdominal:     Palpations: Abdomen is soft. There is no hepatomegaly, splenomegaly or mass.     Tenderness: There is no abdominal tenderness.      Comments: +Spleen tip palpable 2 fingerbreadths below the left costal margin on deep inspiration  Musculoskeletal:     Right lower leg: No edema.     Left lower leg: No edema.  Lymphadenopathy:     Cervical: No cervical adenopathy.     Right cervical: No superficial, deep or posterior cervical adenopathy.    Left cervical: No superficial, deep or posterior cervical adenopathy.     Upper Body:     Right upper body: No supraclavicular or axillary adenopathy.     Left upper body: No supraclavicular or axillary adenopathy.  Neurological:     General: No focal deficit present.     Mental Status: She is alert and oriented to person, place, and time.  Psychiatric:        Mood and Affect: Mood normal.        Behavior: Behavior normal.     LABS:      Latest Ref Rng & Units 01/26/2023   11:05 AM 12/26/2022   11:41 AM 11/28/2022   10:02 AM  CBC  WBC 4.0 - 10.5 K/uL 20.5  20.3  22.6   Hemoglobin 12.0 - 15.0 g/dL 9.0  7.9  8.2   Hematocrit 36.0 - 46.0 % 28.6  24.3  25.8   Platelets 150 - 400 K/uL 193  159  206       Latest Ref Rng & Units 01/26/2023   11:05 AM 11/28/2022   10:02 AM 10/04/2022    2:48 PM  CMP  Glucose 70 - 99 mg/dL 409  811  914   BUN 8 - 23 mg/dL 29  31  28    Creatinine 0.44 - 1.00 mg/dL 7.82  9.56  2.13   Sodium 135 - 145 mmol/L 138  136  138   Potassium 3.5 - 5.1 mmol/L 4.1  3.9  4.0   Chloride 98 - 111 mmol/L 106  109  109   CO2 22 - 32 mmol/L 23  22  22    Calcium 8.9 - 10.3 mg/dL 8.9  8.3  8.7   Total Protein 6.5 - 8.1 g/dL 7.5  7.9  8.1   Total Bilirubin <1.2 mg/dL 0.6  0.5  0.7   Alkaline Phos 38 - 126 U/L 101  96  124   AST 15 - 41 U/L 21  22  26    ALT 0 - 44 U/L 19  20  21       No results found for: "CEA1", "CEA" / No results found for: "CEA1", "CEA" No results found for: "PSA1" No results found for: "YQM578" No results found for: "ION629"  No results found for: "TOTALPROTELP", "ALBUMINELP", "A1GS", "A2GS", "BETS", "BETA2SER", "GAMS", "MSPIKE",  "SPEI" Lab Results  Component Value Date   TIBC 329 08/25/2015   TIBC 336 02/21/2011   FERRITIN 26 08/25/2015   FERRITIN 81 02/21/2011   IRONPCTSAT 8 (L) 08/25/2015   IRONPCTSAT 23 02/21/2011   Lab Results  Component Value Date   LDH 260 (H) 01/26/2023   LDH 588 (H) 07/19/2022   LDH 620 (H) 06/07/2022     STUDIES:   No results found.

## 2023-01-26 NOTE — Progress Notes (Signed)
Patient is taking Ojjarra as prescribed. She has not missed any doses and reports no side effects at this time.

## 2023-01-27 DIAGNOSIS — F330 Major depressive disorder, recurrent, mild: Secondary | ICD-10-CM | POA: Diagnosis not present

## 2023-01-27 DIAGNOSIS — F411 Generalized anxiety disorder: Secondary | ICD-10-CM | POA: Diagnosis not present

## 2023-02-06 ENCOUNTER — Ambulatory Visit (INDEPENDENT_AMBULATORY_CARE_PROVIDER_SITE_OTHER): Payer: Medicare Other

## 2023-02-06 VITALS — Ht 65.0 in | Wt 125.4 lb

## 2023-02-06 DIAGNOSIS — Z1231 Encounter for screening mammogram for malignant neoplasm of breast: Secondary | ICD-10-CM

## 2023-02-06 DIAGNOSIS — N184 Chronic kidney disease, stage 4 (severe): Secondary | ICD-10-CM | POA: Diagnosis not present

## 2023-02-06 DIAGNOSIS — D631 Anemia in chronic kidney disease: Secondary | ICD-10-CM | POA: Diagnosis not present

## 2023-02-06 DIAGNOSIS — C946 Myelodysplastic disease, not classified: Secondary | ICD-10-CM | POA: Diagnosis not present

## 2023-02-06 DIAGNOSIS — N179 Acute kidney failure, unspecified: Secondary | ICD-10-CM | POA: Diagnosis not present

## 2023-02-06 DIAGNOSIS — I129 Hypertensive chronic kidney disease with stage 1 through stage 4 chronic kidney disease, or unspecified chronic kidney disease: Secondary | ICD-10-CM | POA: Diagnosis not present

## 2023-02-06 DIAGNOSIS — Z780 Asymptomatic menopausal state: Secondary | ICD-10-CM

## 2023-02-06 DIAGNOSIS — Z0000 Encounter for general adult medical examination without abnormal findings: Secondary | ICD-10-CM | POA: Diagnosis not present

## 2023-02-06 DIAGNOSIS — D630 Anemia in neoplastic disease: Secondary | ICD-10-CM | POA: Diagnosis not present

## 2023-02-06 NOTE — Progress Notes (Signed)
 Because this visit was a virtual/telehealth visit,  certain criteria was not obtained, such a blood pressure, CBG if applicable, and timed get up and go. Any medications not marked as "taking" were not mentioned during the medication reconciliation part of the visit. Any vitals not documented were not able to be obtained due to this being a telehealth visit or patient was unable to self-report a recent blood pressure reading due to a lack of equipment at home via telehealth. Vitals that have been documented are verbally provided by the patient.   Subjective:   Renee Waters is a 77 y.o. female who presents for Medicare Annual (Subsequent) preventive examination.  Visit Complete: Virtual I connected with  Renee Waters on 02/06/23 by a audio enabled telemedicine application and verified that I am speaking with the correct person using two identifiers.  Patient Location: Home  Provider Location: Home Office  I discussed the limitations of evaluation and management by telemedicine. The patient expressed understanding and agreed to proceed.  Vital Signs: Because this visit was a virtual/telehealth visit, some criteria may be missing or patient reported. Any vitals not documented were not able to be obtained and vitals that have been documented are patient reported.  Patient Medicare AWV questionnaire was completed by the patient on na; I have confirmed that all information answered by patient is correct and no changes since this date.  Cardiac Risk Factors include: advanced age (>27men, >45 women);dyslipidemia;hypertension     Objective:    Today's Vitals   02/06/23 1231 02/06/23 1237  Weight: 125 lb 6.4 oz (56.9 kg)   Height: 5\' 5"  (1.651 m)   PainSc:  5    Body mass index is 20.87 kg/m.     02/06/2023   12:31 PM 01/26/2023   12:07 PM 12/26/2022    3:14 PM 11/28/2022   11:22 AM 10/04/2022    2:43 PM 09/01/2022    2:16 PM 08/09/2022    8:04 AM  Advanced Directives  Does Patient  Have a Medical Advance Directive? Yes Yes Yes Yes Yes Yes Yes  Type of Special educational needs teacher of [place];Living will Healthcare Power of [place];Living will Healthcare Power of [place];Living will Healthcare Power of [place];Living will Healthcare Power of [place];Living will Healthcare Power of Attorney  Does patient want to make changes to medical advance directive?   No - Patient declined   No - Patient declined No - Patient declined  Copy of Healthcare Power of Attorney in Chart?  No - copy requested No - copy requested No - copy requested No - copy requested No - copy requested No - copy requested  Would patient like information on creating a medical advance directive? No - Patient declined No - Patient declined No - Patient declined No - Patient declined No - Patient declined No - Patient declined No - Patient declined    Current Medications (verified) Outpatient Encounter Medications as of 02/06/2023  Medication Sig   acyclovir (ZOVIRAX) 800 MG tablet Take 1 tablet (800 mg total) by mouth 5 (five) times daily as needed.   allopurinol (ZYLOPRIM) 100 MG tablet Take 2 tablets by mouth once daily   cholecalciferol (CHOLECALCIFEROL) 25 MCG tablet Take 1 tablet (1,000 Units total) by mouth daily.   EPINEPHrine 0.3 mg/0.3 mL IJ SOAJ injection INJECT 0.3 MLS INTO MUSCLE ONCE AS NEEDED   hydrOXYzine (ATARAX) 50 MG tablet Take 1 tablet by mouth three times daily as needed   lidocaine-prilocaine (EMLA) cream Apply 1 Application topically as needed (  Access to port and pain).   metoprolol tartrate (LOPRESSOR) 25 MG tablet Take 0.5 tablets (12.5 mg total) by mouth 2 (two) times daily.   mirtazapine (REMERON) 15 MG tablet Take 15 mg by mouth at bedtime.   momelotinib dihydrochloride (OJJAARA) 100 MG tablet Take 1 tablet (100 mg total) by mouth daily.   Multiple Vitamin (MULTIVITAMIN WITH MINERALS) TABS tablet Take 1 tablet by mouth daily.   oxyCODONE (ROXICODONE) 15 MG immediate release  tablet Take 15 mg by mouth every 6 (six) hours as needed.   pantoprazole (PROTONIX) 40 MG tablet Take 1 tablet (40 mg total) by mouth 2 (two) times daily before a meal.   potassium chloride (KLOR-CON) 10 MEQ tablet Take 1 tablet by mouth once daily   predniSONE (DELTASONE) 10 MG tablet Take 2 tablets (20 mg total) by mouth daily with breakfast.   pregabalin (LYRICA) 25 MG capsule Take 1 capsule (25 mg total) by mouth daily.   prochlorperazine (COMPAZINE) 10 MG tablet TAKE 1 TABLET BY MOUTH EVERY 6 HOURS AS NEEDED FOR NAUSEA OR VOMITING   temazepam (RESTORIL) 30 MG capsule Take 1 capsule (30 mg total) by mouth at bedtime.   triamcinolone ointment (KENALOG) 0.5 % Apply topically daily.   trimethoprim (TRIMPEX) 100 MG tablet Take 1 tablet (100 mg total) by mouth daily.   cetirizine (ZYRTEC) 10 MG tablet Take 10 mg by mouth daily. (Patient not taking: Reported on 02/06/2023)   clindamycin (CLEOCIN) 300 MG capsule Take 300 mg by mouth 3 (three) times daily. (Patient not taking: Reported on 02/06/2023)   naloxegol oxalate (MOVANTIK) 25 MG TABS tablet Take 1 tablet (25 mg total) by mouth daily. (Patient not taking: Reported on 02/06/2023)   ondansetron (ZOFRAN-ODT) 8 MG disintegrating tablet Take 1 tablet (8 mg total) by mouth every 8 (eight) hours as needed for nausea or vomiting. (Patient not taking: Reported on 02/06/2023)   Facility-Administered Encounter Medications as of 02/06/2023  Medication   lanreotide acetate (SOMATULINE DEPOT) 120 MG/0.5ML injection   lanreotide acetate (SOMATULINE DEPOT) 120 MG/0.5ML injection   lanreotide acetate (SOMATULINE DEPOT) 120 MG/0.5ML injection   octreotide (SANDOSTATIN LAR) 30 MG IM injection   octreotide (SANDOSTATIN LAR) 30 MG IM injection    Allergies (verified) Bee venom, Norvasc [amlodipine], Tylenol [acetaminophen], Hydralazine, Red dye #40 (allura red), Percocet [oxycodone-acetaminophen], and Tyloxapol   History: Past Medical History:  Diagnosis  Date   Acute cholangitis    Acute respiratory failure with hypoxia (HCC) 09/01/2021   Allergy    Anemia    Anxiety    Arthritis    Phreesia 03/18/2020   Barrett's esophagus    Cataract    Chronic back pain    Chronic neck pain    CKD (chronic kidney disease) stage 3, GFR 30-59 ml/min (HCC) 10/29/2020   Depression    Genital herpes    GERD (gastroesophageal reflux disease)    H/O degenerative disc disease    History of blood transfusion    Hypertension    Hypoxia 12/01/2021   Insomnia    Lupus (systemic lupus erythematosus) (HCC)    Neuromuscular disorder (HCC)    Osteoarthritis    S/P colonoscopy 07/2003   normal, no polyps   S/P endoscopy June 2005, Oct 2009   2005: short-segment Barrett's, 2009: short-segment Barrett's   Upper respiratory tract infection 07/09/2020   UTI (lower urinary tract infection) 11/2012   Past Surgical History:  Procedure Laterality Date   ABDOMINAL HYSTERECTOMY     BACK SURGERY  BIOPSY N/A 03/20/2014   Procedure: BIOPSY;  Surgeon: Corbin Ade, MD;  Location: [facility];  Service: Endoscopy;  Laterality: N/A;   BIOPSY  09/14/2015   Procedure: BIOPSY;  Surgeon: Corbin Ade, MD;  Location[facility];  Service: Endoscopy;;  esophageal and gastric   BIOPSY  07/23/2019   Procedure: BIOPSY;  Surgeon: Corbin Ade, MD;  Location: [facility];  Service: Endoscopy;;  esophageal    CARPAL TUNNEL RELEASE Left 2013   cervical disectomy  2002   CESAREAN SECTION N/A    Phreesia 03/18/2020   CHOLECYSTECTOMY     with lysis of adhesions for sbo; "ruptured gallbladder".   COLONOSCOPY  11/09/2011   RMR: Melanosis coli   COLONOSCOPY WITH PROPOFOL N/A 09/14/2015   Dr. Jena Gauss: diverticulosis, 3mm TA removed. next TCS 09/2020.    COLONOSCOPY WITH PROPOFOL N/A 04/30/2020   Procedure: COLONOSCOPY WITH PROPOFOL;  Surgeon: Corbin Ade, MD;  [facility];  Service: Endoscopy;  Laterality: N/A;  PM (ASA 3)   DENTAL SURGERY  11/2015   multiple  tooth extraction   ESOPHAGOGASTRODUODENOSCOPY  11/29/2007   salmon-colored  tongue   longest stable at  3 cm, distal esophagus as described previously status post biopsy/ Hiatal hernia, otherwise normal stomach D1 and D2   ESOPHAGOGASTRODUODENOSCOPY  01/06/11   short segment Barrett's esophagus s/p bx/Hiatal hernia   ESOPHAGOGASTRODUODENOSCOPY (EGD) WITH PROPOFOL N/A 03/20/2014   VHQ:IONGEXBM distal esophagus short segment barrett's, bx with no dysplasia. next egd in 03/2017   ESOPHAGOGASTRODUODENOSCOPY (EGD) WITH PROPOFOL N/A 09/14/2015   Dr. Jena Gauss: Barrett's without dysplasia, gastritis benign bx, hiatal hernia. next EGD 09/2018.   ESOPHAGOGASTRODUODENOSCOPY (EGD) WITH PROPOFOL N/A 07/23/2019   Procedure: ESOPHAGOGASTRODUODENOSCOPY (EGD) WITH PROPOFOL;  Surgeon: Corbin Ade, MD;  [facility];  Service: Endoscopy;  Laterality: N/A;  3:00pm   EYE SURGERY N/A    Phreesia 03/18/2020   HERNIA REPAIR Right 07/2010   Dr. Arlean Hopping   IR IMAGING GUIDED PORT INSERTION  02/09/2021   JOINT REPLACEMENT     LAPAROSCOPIC CHOLECYSTECTOMY  2017   at [facility]   POLYPECTOMY  09/14/2015   Procedure: POLYPECTOMY;  Surgeon: Corbin Ade, MD;  Location: [facility];  Service: Endoscopy;;  ascending colon   right hip replacement  07/2010   went back in sept 2012 to fix   SHOULDER ARTHROSCOPY  2008   left   SPINE SURGERY N/A    Phreesia 03/18/2020   TOTAL HIP REVISION Right 12/17/2012   Procedure: RIGHT TOTAL HIP REVISION;  Surgeon: [name], MD;  Location: [facility];  Service: Orthopedics;  Laterality: Right;   WRIST SURGERY Right 2011   open reduction right wrist.   Family History  Problem Relation Age of Onset   Hypertension Mother    Stroke Mother    Colon cancer Neg Hx    Anesthesia problems Neg Hx    Hypotension Neg Hx    Malignant hyperthermia Neg Hx    Pseudochol deficiency Neg Hx    Gastric cancer Neg Hx    Esophageal cancer Neg Hx    Social History   Socioeconomic History    Marital status: Married    Spouse name: louis   Number of children: 4   Years of education: 12+   Highest education level: Some college, no degree  Occupational History   Occupation: Audiological scientist  - retired   Occupation: non profit  Tobacco Use   Smoking status: Former  Current packs/day: 0.00    Average packs/day: 0.3 packs/day for 25.0 years (6.3 ttl pk-yrs)    Types: Cigarettes    Start date: 02/06/1978    Quit date: 02/07/2003    Years since quitting: 20.0   Smokeless tobacco: Never   Tobacco comments:    quit in 2004  Vaping Use   Vaping status: Never Used  Substance and Sexual Activity   Alcohol use: No   Drug use: No   Sexual activity: Yes    Birth control/protection: Surgical  Other Topics Concern   Not on file  Social History Narrative   Not on file   Social Drivers of Health   Financial Resource Strain: High Risk (02/06/2023)   Overall Financial Resource Strain (CARDIA)    Difficulty of Paying Living Expenses: Hard  Food Insecurity: No Food Insecurity (02/06/2023)   Hunger Vital Sign    Worried About Running Out of Food in the Last Year: Never true    Ran Out of Food in the Last Year: Never true  Transportation Needs: No Transportation Needs (02/06/2023)   PRAPARE - Administrator, Civil Service (Medical): No    Lack of Transportation (Non-Medical): No  Physical Activity: Patient Declined (02/06/2023)   Exercise Vital Sign    Days of Exercise per Week: Patient declined    Minutes of Exercise per Session: Patient declined  Stress: No Stress Concern Present (02/06/2023)   [employer] of Occupational Health - Occupational Stress Questionnaire    Feeling of Stress : Not at all  Social Connections: Moderately Isolated (02/06/2023)   Social Connection and Isolation Panel [NHANES]    Frequency of Communication with Friends and Family: More than three times a week    Frequency of Social Gatherings with Friends and Family: Once a week     Attends Religious Services: Never    Database administrator or Organizations: No    Attends Engineer, structural: Never    Marital Status: Married    Tobacco Counseling Counseling given: Not Answered Tobacco comments: quit in 2004   Clinical Intake:  Pre-visit preparation completed: Yes  Pain : No/denies pain Pain Score: 5  Pain Location: Back Pain Descriptors / Indicators: Sharp, Radiating, Shooting, Dull Pain Frequency: Constant     BMI - recorded: 20.87 Nutritional Status: BMI of 19-24  Normal Nutritional Risks: None Diabetes: No  How often do you need to have someone help you when you read instructions, pamphlets, or other written materials from your doctor or pharmacy?: 1 - Never  Interpreter Needed?: No  Information entered by ::  Kasper Mudrick, CMA   Activities of Daily Living    02/06/2023   12:42 PM 07/26/2022    9:02 PM  In your present state of health, do you have any difficulty performing the following activities:  Hearing? 1   Comment declined referral   Vision? 0   Difficulty concentrating or making decisions? 0   Walking or climbing stairs? 0   Dressing or bathing? 1   Doing errands, shopping? 1 0  Preparing Food and eating ? N   Using the Toilet? N   In the past six months, have you accidently leaked urine? N   Do you have problems with loss of bowel control? N   Managing your Medications? N   Managing your Finances? N   Housekeeping or managing your Housekeeping? Y   Comment family helps with housekeeping     Patient Care Team: Kerri Perches, MD  as PCP - General Mallipeddi, Orion Modest, MD as PCP - Cardiology (Cardiology) Rourk, Gerrit Friends, MD (Gastroenterology) Vickki Hearing, MD as Consulting Physician (Orthopedic Surgery) [name], MD as Medical Oncologist (Oncology) Therese Sarah, RN as Oncology Nurse Navigator (Oncology) Clinton Gallant, RN as [facility] Care Management  Indicate any  recent Medical Services you may have received from other than [facility] providers in the past year (date may be approximate).     Assessment:   This is a routine wellness examination for Renee Waters.  Hearing/Vision screen Hearing Screening - Comments:: Patient states she sometimes has difficulty hearing. Patient declines referral today.  Vision Screening - Comments:: Wears rx glasses - up to date with routine eye exams  [place]   Goals Addressed             This Visit's Progress    Patient Stated       To get my garden going again.        Depression Screen    02/06/2023   12:44 PM 12/29/2022    3:49 PM 12/27/2022   10:18 AM 10/05/2022    1:38 PM 08/24/2022    9:55 AM 04/20/2022   10:36 AM 03/14/2022    3:38 PM  PHQ 2/9 Scores  PHQ - 2 Score 0 3 0 0 0 0 0  PHQ- 9 Score 2 6 2 1 4 4  0    Fall Risk    02/06/2023   12:42 PM 12/27/2022   10:18 AM 10/05/2022    1:38 PM 08/24/2022    9:55 AM 04/20/2022   10:36 AM  Fall Risk   Falls in the past year? 1 1 1 1 1   Number falls in past yr: 1 0 0 1 0  Injury with Fall? 1 0 0 0 0  Risk for fall due to : History of fall(s);Impaired balance/gait;Orthopedic patient;Impaired mobility Impaired balance/gait;Other (Comment) Impaired balance/gait;History of fall(s)  History of fall(s);Impaired balance/gait  Risk for fall due to: Comment  age     Follow up Education provided;Falls prevention discussed;Falls evaluation completed Follow up appointment Falls evaluation completed  Falls evaluation completed    MEDICARE RISK AT HOME: Medicare Risk at Home Any stairs in or around the home?: No If so, are there any without handrails?: No Home free of loose throw rugs in walkways, pet beds, electrical cords, etc?: Yes Use of a cane, walker or w/c?: Yes Grab bars in the bathroom?: No Shower chair or bench in shower?: No Elevated toilet seat or a handicapped toilet?: No  TIMED UP AND GO:  Was the test performed?  No    Cognitive Function:     12/03/2018   11:26 AM  MMSE - Mini Mental State Exam  Orientation to time 5  Orientation to Place 5  Registration 3  Attention/ Calculation 5  Recall 3  Language- name 2 objects 2  Language- repeat 1  Language- follow 3 step command 3  Language- read & follow direction 1  Write a sentence 1  Copy design 1  Total score 30        02/06/2023   12:39 PM 01/11/2022    1:08 PM 12/28/2020    3:22 PM 12/18/2019   11:48 AM 12/10/2018   11:30 AM  6CIT Screen  What Year? 0 points 0 points 0 points 0 points 0 points  What month? 0 points 0 points 0 points 0 points 0 points  What time? 0 points 0 points 0 points  0 points 0 points  Count back from 20 0 points 0 points 0 points 0 points 0 points  Months in reverse 0 points 0 points 0 points 0 points 0 points  Repeat phrase 0 points 0 points 0 points 0 points 0 points  Total Score 0 points 0 points 0 points 0 points 0 points    Immunizations Immunization History  Administered Date(s) Administered   Fluad Quad(high Dose 65+) 12/03/2018, 11/13/2019, 10/28/2020, 10/18/2021   Fluad Trivalent(High Dose 65+) 10/05/2022   Fluzone Influenza virus vaccine,trivalent (IIV3), split virus 11/06/2006, 11/24/2009   Influenza Split 11/08/2010, 12/06/2011   Influenza Whole 11/06/2006, 11/24/2009   Influenza, High Dose Seasonal PF 11/09/2017   Influenza,inj,Quad PF,6+ Mos 10/31/2012, 01/14/2014, 11/25/2014, 10/05/2015, 11/24/2016   Moderna Sars-Covid-2 Vaccination 04/04/2019, 05/03/2019, 01/28/2020, 09/04/2020   Pneumococcal Conjugate-13 09/17/2013   Pneumococcal Polysaccharide-23 02/22/2011   Td 07/31/2007   Tdap 07/31/2007, 09/05/2017   Zoster Recombinant(Shingrix) 02/18/2021, 04/17/2021   Zoster, Live 07/31/2007    TDAP status: Up to date  Flu Vaccine status: Up to date  Pneumococcal vaccine status: Up to date  Covid-19 vaccine status: Information provided on how to obtain vaccines.   Qualifies for Shingles Vaccine? No   Zostavax  completed Yes   Shingrix Completed?: Yes  Screening Tests Health Maintenance  Topic Date Due   COVID-19 Vaccine (5 - 2024-25 season) 10/09/2022   Medicare Annual Wellness (AWV)  01/12/2023   DTaP/Tdap/Td (4 - Td or Tdap) 09/06/2027   Pneumonia Vaccine 14+ Years old  Completed   INFLUENZA VACCINE  Completed   DEXA SCAN  Completed   Hepatitis C Screening  Completed   Zoster Vaccines- Shingrix  Completed   HPV VACCINES  Aged Out   Colonoscopy  Discontinued    Health Maintenance  Health Maintenance Due  Topic Date Due   COVID-19 Vaccine (5 - 2024-25 season) 10/09/2022   Medicare Annual Wellness (AWV)  01/12/2023    Colorectal cancer screening: No longer required.   Mammogram status: Ordered 02/06/2023. Pt provided with contact info and advised to call to schedule appt.   Bone Density status: Ordered 02/06/2023. Pt provided with contact info and advised to call to schedule appt.  Lung Cancer Screening: (Low Dose CT Chest recommended if Age 45-80 years, 20 pack-year currently smoking OR have quit w/in 15years.) does not qualify.   Lung Cancer Screening Referral: na  Additional Screening:  Hepatitis C Screening: does not qualify; Completed   Vision Screening: Recommended annual ophthalmology exams for early detection of glaucoma and other disorders of the eye. Is the patient up to date with their annual eye exam?  Yes  Who is the provider or what is the name of the office in which the patient attends annual eye exams? [facility] If pt is not established with a provider, would they like to be referred to a provider to establish care? No .   Dental Screening: Recommended annual dental exams for proper oral hygiene  Diabetic Foot Exam: na  Community Resource Referral / Chronic Care Management: CRR required this visit?  No   CCM required this visit?  No     Plan:     I have personally reviewed and noted the following in the patient's chart:   Medical and social  history Use of alcohol, tobacco or illicit drugs  Current medications and supplements including opioid prescriptions. Patient is currently taking opioid prescriptions. Information provided to patient regarding non-opioid alternatives. Patient advised to discuss non-opioid treatment plan with their  provider. Functional ability and status Nutritional status Physical activity Advanced directives List of other physicians Hospitalizations, surgeries, and ER visits in previous 12 months Vitals Screenings to include cognitive, depression, and falls Referrals and appointments  In addition, I have reviewed and discussed with patient certain preventive protocols, quality metrics, and best practice recommendations. A written personalized care plan for preventive services as well as general preventive health recommendations were provided to patient.     Jordan Hawks Rain [place], CMA   02/06/2023   After Visit Summary: (Mail) Due to this being a telephonic visit, the after visit summary with patients personalized plan was offered to patient via mail   Nurse Notes: see routing comment

## 2023-02-06 NOTE — Patient Instructions (Signed)
Renee Waters , Thank you for taking time to come for your Medicare Wellness Visit. I appreciate your ongoing commitment to your health goals. Please review the following plan we discussed and let me know if I can assist you in the future.   Referrals/Orders/Follow-Ups/Clinician Recommendations:  Next Medicare Annual Wellness Visit: February 07, 2024 at 8:00 am video visit  You have an order for:  []   2D Mammogram  [x]   3D Mammogram  [x]   Bone Density   []   Lung Cancer Screening  Please call for appointment:   [facility] [address] [place], [place] [address] [phone]  Make sure to wear two-piece clothing.  No lotions powders or deodorants the day of the appointment Make sure to bring picture ID and insurance card.  Bring list of medications you are currently taking including any supplements.   Schedule your [place] screening mammogram through MyChart!   Log into your MyChart account.  Go to 'Visit' (or 'Appointments' if on mobile App) --> Schedule an Appointment  Under 'Select a Reason for Visit' choose the Mammogram Screening option.  Complete the pre-visit questions and select the time and place that best fits your schedule.    This is a list of the screening recommended for you and due dates:  Health Maintenance  Topic Date Due   Mammogram  04/13/2021   COVID-19 Vaccine (5 - 2024-25 season) 10/09/2022   Medicare Annual Wellness Visit  02/06/2024   DTaP/Tdap/Td vaccine (4 - Td or Tdap) 09/06/2027   Pneumonia Vaccine  Completed   Flu Shot  Completed   DEXA scan (bone density measurement)  Completed   Hepatitis C Screening  Completed   Zoster (Shingles) Vaccine  Completed   HPV Vaccine  Aged Out   Colon Cancer Screening  Discontinued    Advanced directives: (Declined) Advance directive discussed with you today. Even though you declined this today, please call our office should you change your mind, and we can give you  the proper paperwork for you to fill out.  Next Medicare Annual Wellness Visit scheduled for next year: Yes  Preventive Care 60 Years and Older, Female Preventive care refers to lifestyle choices and visits with your health care provider that can promote health and wellness. Preventive care visits are also called wellness exams. What can I expect for my preventive care visit? Counseling Your health care provider may ask you questions about your: Medical history, including: Past medical problems. Family medical history. Pregnancy and menstrual history. History of falls. Current health, including: Memory and ability to understand (cognition). Emotional well-being. Home life and relationship well-being. Sexual activity and sexual health. Lifestyle, including: Alcohol, nicotine or tobacco, and drug use. Access to firearms. Diet, exercise, and sleep habits. Work and work Astronomer. Sunscreen use. Safety issues such as seatbelt and bike helmet use. Physical exam Your health care provider will check your: Height and weight. These may be used to calculate your BMI (body mass index). BMI is a measurement that tells if you are at a healthy weight. Waist circumference. This measures the distance around your waistline. This measurement also tells if you are at a healthy weight and may help predict your risk of certain diseases, such as type 2 diabetes and high blood pressure. Heart rate and blood pressure. Body temperature. Skin for abnormal spots. What immunizations do I need?  Vaccines are usually given at various ages, according to a schedule. Your health care provider will recommend vaccines for  you based on your age, medical history, and lifestyle or other factors, such as travel or where you work. What tests do I need? Screening Your health care provider may recommend screening tests for certain conditions. This may include: Lipid and cholesterol levels. Hepatitis C  test. Hepatitis B test. HIV (human immunodeficiency virus) test. STI (sexually transmitted infection) testing, if you are at risk. Lung cancer screening. Colorectal cancer screening. Diabetes screening. This is done by checking your blood sugar (glucose) after you have not eaten for a while (fasting). Mammogram. Talk with your health care provider about how often you should have regular mammograms. BRCA-related cancer screening. This may be done if you have a family history of breast, ovarian, tubal, or peritoneal cancers. Bone density scan. This is done to screen for osteoporosis. Talk with your health care provider about your test results, treatment options, and if necessary, the need for more tests. Follow these instructions at home: Eating and drinking  Eat a diet that includes fresh fruits and vegetables, whole grains, lean protein, and low-fat dairy products. Limit your intake of foods with high amounts of sugar, saturated fats, and salt. Take vitamin and mineral supplements as recommended by your health care provider. Do not drink alcohol if your health care provider tells you not to drink. If you drink alcohol: Limit how much you have to 0-1 drink a day. Know how much alcohol is in your drink. In the [place] one drink equals one 12 oz bottle of beer (355 mL), one 5 oz glass of wine (148 mL), or one 1 oz glass of hard liquor (44 mL). Lifestyle Brush your teeth every morning and night with fluoride toothpaste. Floss one time each day. Exercise for at least 30 minutes 5 or more days each week. Do not use any products that contain nicotine or tobacco. These products include cigarettes, chewing tobacco, and vaping devices, such as e-cigarettes. If you need help quitting, ask your health care provider. Do not use drugs. If you are sexually active, practice safe sex. Use a condom or other form of protection in order to prevent STIs. Take aspirin only as told by your health care provider.  Make sure that you understand how much to take and what form to take. Work with your health care provider to find out whether it is safe and beneficial for you to take aspirin daily. Ask your health care provider if you need to take a cholesterol-lowering medicine (statin). Find healthy ways to manage stress, such as: Meditation, yoga, or listening to music. Journaling. Talking to a trusted person. Spending time with friends and family. Minimize exposure to UV radiation to reduce your risk of skin cancer. Safety Always wear your seat belt while driving or riding in a vehicle. Do not drive: If you have been drinking alcohol. Do not ride with someone who has been drinking. When you are tired or distracted. While texting. If you have been using any mind-altering substances or drugs. Wear a helmet and other protective equipment during sports activities. If you have firearms in your house, make sure you follow all gun safety procedures. What's next? Visit your health care provider once a year for an annual wellness visit. Ask your health care provider how often you should have your eyes and teeth checked. Stay up to date on all vaccines. This information is not intended to replace advice given to you by your health care provider. Make sure you discuss any questions you have with your health care provider. Document  Revised: 07/22/2020 Document Reviewed: 07/22/2020 Elsevier Patient Education  2024 [employer]. Understanding Your Risk for Falls Millions of people have serious injuries from falls each year. It is important to understand your risk of falling. Talk with your health care provider about your risk and what you can do to lower it. If you do have a serious fall, make sure to tell your provider. Falling once raises your risk of falling again. How can falls affect me? Serious injuries from falls are common. These include: Broken bones, such as hip fractures. Head injuries, such as  traumatic brain injuries (TBI) or concussions. A fear of falling can cause you to avoid activities and stay at home. This can make your muscles weaker and raise your risk for a fall. What can increase my risk? There are a number of risk factors that increase your risk for falling. The more risk factors you have, the higher your risk of falling. Serious injuries from a fall happen most often to people who are older than 77 years old. Teenagers and young adults ages 23-29 are also at higher risk. Common risk factors include: Weakness in the lower body. Being generally weak or confused due to long-term (chronic) illness. Dizziness or balance problems. Poor vision. Medicines that cause dizziness or drowsiness. These may include: Medicines for your blood pressure, heart, anxiety, insomnia, or swelling (edema). Pain medicines. Muscle relaxants. Other risk factors include: Drinking alcohol. Having had a fall in the past. Having foot pain or wearing improper footwear. Working at a dangerous job. Having any of the following in your home: Tripping hazards, such as floor clutter or loose rugs. Poor lighting. Pets. Having dementia or memory loss. What actions can I take to lower my risk of falling?     Physical activity Stay physically fit. Do strength and balance exercises. Consider taking a regular class to build strength and balance. Yoga and tai chi are good options. Vision Have your eyes checked every year and your prescription for glasses or contacts updated as needed. Shoes and walking aids Wear non-skid shoes. Wear shoes that have rubber soles and low heels. Do not wear high heels. Do not walk around the house in socks or slippers. Use a cane or walker as told by your provider. Home safety Attach secure railings on both sides of your stairs. Install grab bars for your bathtub, shower, and toilet. Use a non-skid mat in your bathtub or shower. Attach bath mats securely with  double-sided, non-slip rug tape. Use good lighting in all rooms. Keep a flashlight near your bed. Make sure there is a clear path from your bed to the bathroom. Use night-lights. Do not use throw rugs. Make sure all carpeting is taped or tacked down securely. Remove all clutter from walkways and stairways, including extension cords. Repair uneven or broken steps and floors. Avoid walking on icy or slippery surfaces. Walk on the grass instead of on icy or slick sidewalks. Use ice melter to get rid of ice on walkways in the winter. Use a cordless phone. Questions to ask your health care provider Can you help me check my risk for a fall? Do any of my medicines make me more likely to fall? Should I take a vitamin D supplement? What exercises can I do to improve my strength and balance? Should I make an appointment to have my vision checked? Do I need a bone density test to check for weak bones (osteoporosis)? Would it help to use a cane or a walker?  Where to find more information Centers for Disease Control and Prevention, STEADI: TonerPromos.no Community-Based Fall Prevention Programs: TonerPromos.no [employer] on Aging: BaseRingTones.pl Contact a health care provider if: You fall at home. You are afraid of falling at home. You feel weak, drowsy, or dizzy. This information is not intended to replace advice given to you by your health care provider. Make sure you discuss any questions you have with your health care provider. Document Revised: 09/27/2021 Document Reviewed: 09/27/2021 Elsevier Patient Education  2024 Elsevier Inc. Managing Pain Without Opioids Opioids are strong medicines used to treat moderate to severe pain. For some people, especially those who have long-term (chronic) pain, opioids may not be the best choice for pain management due to: Side effects like nausea, constipation, and sleepiness. The risk of addiction (opioid use disorder). The longer you take opioids, the greater your  risk of addiction. Pain that lasts for more than 3 months is called chronic pain. Managing chronic pain usually requires more than one approach and is often provided by a team of health care providers working together (multidisciplinary approach). Pain management may be done at a pain management center or pain clinic. How to manage pain without the use of opioids Use non-opioid medicines Non-opioid medicines for pain may include: Over-the-counter or prescription non-steroidal anti-inflammatory drugs (NSAIDs). These may be the first medicines used for pain. They work well for muscle and bone pain, and they reduce swelling. Acetaminophen. This over-the-counter medicine may work well for milder pain but not swelling. Antidepressants. These may be used to treat chronic pain. A certain type of antidepressant (tricyclics) is often used. These medicines are given in lower doses for pain than when used for depression. Anticonvulsants. These are usually used to treat seizures but may also reduce nerve (neuropathic) pain. Muscle relaxants. These relieve pain caused by sudden muscle tightening (spasms). You may also use a pain medicine that is applied to the skin as a patch, cream, or gel (topical analgesic), such as a numbing medicine. These may cause fewer side effects than medicines taken by mouth. Do certain therapies as directed Some therapies can help with pain management. They include: Physical therapy. You will do exercises to gain strength and flexibility. A physical therapist may teach you exercises to move and stretch parts of your body that are weak, stiff, or painful. You can learn these exercises at physical therapy visits and practice them at home. Physical therapy may also involve: Massage. Heat wraps or applying heat or cold to affected areas. Electrical signals that interrupt pain signals (transcutaneous electrical nerve stimulation, TENS). Weak lasers that reduce pain and swelling (low-level  laser therapy). Signals from your body that help you learn to regulate pain (biofeedback). Occupational therapy. This helps you to learn ways to function at home and work with less pain. Recreational therapy. This involves trying new activities or hobbies, such as a physical activity or drawing. Mental health therapy, including: Cognitive behavioral therapy (CBT). This helps you learn coping skills for dealing with pain. Acceptance and commitment therapy (ACT) to change the way you think and react to pain. Relaxation therapies, including muscle relaxation exercises and mindfulness-based stress reduction. Pain management counseling. This may be individual, family, or group counseling.  Receive medical treatments Medical treatments for pain management include: Nerve block injections. These may include a pain blocker and anti-inflammatory medicines. You may have injections: Near the spine to relieve chronic back or neck pain. Into joints to relieve back or joint pain. Into nerve areas that supply  a painful area to relieve body pain. Into muscles (trigger point injections) to relieve some painful muscle conditions. A medical device placed near your spine to help block pain signals and relieve nerve pain or chronic back pain (spinal cord stimulation device). Acupuncture. Follow these instructions at home Medicines Take over-the-counter and prescription medicines only as told by your health care provider. If you are taking pain medicine, ask your health care providers about possible side effects to watch out for. Do not drive or use heavy machinery while taking prescription opioid pain medicine. Lifestyle  Do not use drugs or alcohol to reduce pain. If you drink alcohol, limit how much you have to: 0-1 drink a day for women who are not pregnant. 0-2 drinks a day for men. Know how much alcohol is in a drink. In the [place] one drink equals one 12 oz bottle of beer (355 mL), one 5 oz glass of wine  (148 mL), or one 1 oz glass of hard liquor (44 mL). Do not use any products that contain nicotine or tobacco. These products include cigarettes, chewing tobacco, and vaping devices, such as e-cigarettes. If you need help quitting, ask your health care provider. Eat a healthy diet and maintain a healthy weight. Poor diet and excess weight may make pain worse. Eat foods that are high in fiber. These include fresh fruits and vegetables, whole grains, and beans. Limit foods that are high in fat and processed sugars, such as fried and sweet foods. Exercise regularly. Exercise lowers stress and may help relieve pain. Ask your health care provider what activities and exercises are safe for you. If your health care provider approves, join an exercise class that combines movement and stress reduction. Examples include yoga and tai chi. Get enough sleep. Lack of sleep may make pain worse. Lower stress as much as possible. Practice stress reduction techniques as told by your therapist. General instructions Work with all your pain management providers to find the treatments that work best for you. You are an important member of your pain management team. There are many things you can do to reduce pain on your own. Consider joining an online or in-person support group for people who have chronic pain. Keep all follow-up visits. This is important. Where to find more information You can find more information about managing pain without opioids from: American Academy of Pain Medicine: painmed.org Institute for Chronic Pain: instituteforchronicpain.org American Chronic Pain Association: theacpa.org Contact a health care provider if: You have side effects from pain medicine. Your pain gets worse or does not get better with treatments or home therapy. You are struggling with anxiety or depression. Summary Many types of pain can be managed without opioids. Chronic pain may respond better to pain management  without opioids. Pain is best managed when you and a team of health care providers work together. Pain management without opioids may include non-opioid medicines, medical treatments, physical therapy, mental health therapy, and lifestyle changes. Tell your health care providers if your pain gets worse or is not being managed well enough. This information is not intended to replace advice given to you by your health care provider. Make sure you discuss any questions you have with your health care provider. Document Revised: 05/06/2020 Document Reviewed: 05/06/2020 Elsevier Patient Education  2024 [employer].

## 2023-02-07 DIAGNOSIS — L920 Granuloma annulare: Secondary | ICD-10-CM | POA: Diagnosis not present

## 2023-02-10 ENCOUNTER — Encounter: Payer: Self-pay | Admitting: Family Medicine

## 2023-02-10 ENCOUNTER — Ambulatory Visit (INDEPENDENT_AMBULATORY_CARE_PROVIDER_SITE_OTHER): Payer: Medicare Other | Admitting: Family Medicine

## 2023-02-10 VITALS — BP 126/63 | HR 62 | Ht 65.0 in | Wt 124.1 lb

## 2023-02-10 DIAGNOSIS — D471 Chronic myeloproliferative disease: Secondary | ICD-10-CM | POA: Diagnosis not present

## 2023-02-10 DIAGNOSIS — F5105 Insomnia due to other mental disorder: Secondary | ICD-10-CM

## 2023-02-10 DIAGNOSIS — D509 Iron deficiency anemia, unspecified: Secondary | ICD-10-CM

## 2023-02-10 DIAGNOSIS — R531 Weakness: Secondary | ICD-10-CM | POA: Diagnosis not present

## 2023-02-10 DIAGNOSIS — N390 Urinary tract infection, site not specified: Secondary | ICD-10-CM | POA: Diagnosis not present

## 2023-02-10 DIAGNOSIS — I10 Essential (primary) hypertension: Secondary | ICD-10-CM

## 2023-02-10 DIAGNOSIS — R52 Pain, unspecified: Secondary | ICD-10-CM | POA: Diagnosis not present

## 2023-02-10 DIAGNOSIS — K219 Gastro-esophageal reflux disease without esophagitis: Secondary | ICD-10-CM

## 2023-02-10 DIAGNOSIS — F418 Other specified anxiety disorders: Secondary | ICD-10-CM

## 2023-02-10 NOTE — Patient Instructions (Addendum)
 F/U in 16 weeks, call if you need me sooner  Nurse pls work with family for shower chair and bedside commode  Please provide letter stating when oxygen  was discontinued, states her ins  is still paying for oxygen  though she no longer has or needs it  No med changes  I will send a message re vaccines   We wil schedule mammogram  Keep doing all rthe  good things you are doing!  Thanks for choosing [facility], we consider it a privelige to serve you.

## 2023-02-12 ENCOUNTER — Other Ambulatory Visit (HOSPITAL_COMMUNITY): Payer: Self-pay

## 2023-02-12 ENCOUNTER — Telehealth: Payer: Self-pay

## 2023-02-12 NOTE — Assessment & Plan Note (Signed)
 Controlled, no change in medication

## 2023-02-12 NOTE — Assessment & Plan Note (Signed)
 Maintained on prophylactic antibiotic by Urology no recent infection

## 2023-02-12 NOTE — Progress Notes (Signed)
   Renee Waters     MRN: [id number]      DOB: 1945/03/01  Chief Complaint  Patient presents with   Follow-up    Follow up    HPI Renee Waters is here for follow up and re-evaluation of chronic medical conditions, medication management and review of any available recent lab and radiology data.  Preventive health is  to be updated, specifically  Cancer screening , mammogram and Immunization. Which I will check with oncology about  Questions or concerns regarding consultations or procedures which the PT has had in the interim are  addressed.Doing extremely well from Oncology standpoint Now has a therapist who lives near to her and she has ahd one session which is good The PT denies any adverse reactions to current medications since the last visit.  There are no new concerns.  Still needs oxygen  to stop being billed to her ins, letter to be provided reporting date discontinued, also still needs shower chair and bedside commode, needs e mail add to get the supplies sent to supplier   ROS Denies recent fever or chills. Denies sinus pressure, nasal congestion, ear pain or sore throat. Denies chest congestion, productive cough or wheezing. Denies chest pains, palpitations and leg swelling Denies abdominal pain, nausea, vomiting,diarrhea or constipation.   Denies dysuria, frequency, hesitancy or incontinence. C/o intermittent increase in  joint pain, and limitation in mobility.often occurs with increased activity, generally stable and controlled on current meds Denies headaches, seizures, numbness, or tingling. Denies  uncontrolled depression, anxiety or insomnia.Feels good especially as her cancer is improved also has her son living in state most of the time Denies skin break down or rash.   PE  BP 126/63 (BP Location: Right Arm, Patient Position: Sitting, Cuff Size: Normal)   Pulse 62   Ht 5' 5 (1.651 m)   Wt 124 lb 1.3 oz (56.3 kg)   SpO2 (!) 89%   BMI 20.65 kg/m   Patient alert and  oriented and in no cardiopulmonary distress.Looks extremely well  HEENT: No facial asymmetry, EOMI,     Neck supple .  Chest: Clear to auscultation bilaterally.  CVS: S1, S2 no murmurs, no S3.Regular rate.  ABD: Soft non tender. splenomegaly  Ext: No edema  MS: decreased  ROM spine, shoulders, hips and knees.  Skin: Intact, no ulcerations or rash noted.  Psych: Good eye contact, normal affect. Memory intact not anxious or depressed appearing.  CNS: CN 2-12 intact, power,  normal throughout.no focal deficits noted.   Assessment & Plan  Essential hypertension Controlled, no change in medication   MPN (myeloproliferative neoplasm) (HCC) Managed by Oncology , excellent response to current treatment  Recurrent UTI Maintained on prophylactic antibiotic by Urology no recent infection  IDA (iron deficiency anemia) Npo transfusions needed in past several months  GERD without esophagitis Controlled, no change in medication   Generalized weakness Improved , though needs bedside and shower chair  Generalized pain Controlled, no change in medication   Insomnia secondary to depression with anxiety Controlled, no change in medication

## 2023-02-12 NOTE — Telephone Encounter (Addendum)
 Oral Oncology Patient Advocate Encounter   Was successful in securing patient an $3,800.00 grant from [employer] [facility]) to provide copayment coverage for Ojjarra.  This will keep the out of pocket expense at $0.      The billing information is as follows and has been shared with [facility].   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

## 2023-02-12 NOTE — Assessment & Plan Note (Addendum)
 Improved , though needs bedside and shower chair

## 2023-02-12 NOTE — Assessment & Plan Note (Signed)
 Managed by Oncology , excellent response to current treatment

## 2023-02-12 NOTE — Assessment & Plan Note (Signed)
 Npo transfusions needed in past several months

## 2023-02-14 ENCOUNTER — Other Ambulatory Visit: Payer: Self-pay | Admitting: Hematology

## 2023-02-16 ENCOUNTER — Other Ambulatory Visit: Payer: Self-pay

## 2023-02-17 ENCOUNTER — Other Ambulatory Visit: Payer: Self-pay | Admitting: Family Medicine

## 2023-02-20 ENCOUNTER — Other Ambulatory Visit: Payer: Self-pay | Admitting: Family Medicine

## 2023-02-20 ENCOUNTER — Other Ambulatory Visit: Payer: Self-pay

## 2023-02-20 NOTE — Progress Notes (Signed)
 Specialty Pharmacy Refill Coordination Note  Renee Waters is a 78 y.o. female contacted today regarding refills of specialty medication(s) Momelotinib Dihydrochloride  (OJJAARA )   Patient requested Delivery   Delivery date: 02/27/23   Verified address: [address]   [place] [address]   Medication will be filled on 02/24/23.

## 2023-02-21 ENCOUNTER — Encounter: Payer: Self-pay | Admitting: Internal Medicine

## 2023-02-21 ENCOUNTER — Ambulatory Visit: Payer: Medicare Other | Admitting: Internal Medicine

## 2023-02-21 VITALS — BP 134/77 | HR 57 | Temp 98.1°F | Ht 65.0 in | Wt 125.2 lb

## 2023-02-21 DIAGNOSIS — K5903 Drug induced constipation: Secondary | ICD-10-CM | POA: Diagnosis not present

## 2023-02-21 DIAGNOSIS — Z8719 Personal history of other diseases of the digestive system: Secondary | ICD-10-CM

## 2023-02-21 DIAGNOSIS — T402X5A Adverse effect of other opioids, initial encounter: Secondary | ICD-10-CM

## 2023-02-21 DIAGNOSIS — K219 Gastro-esophageal reflux disease without esophagitis: Secondary | ICD-10-CM | POA: Diagnosis not present

## 2023-02-21 DIAGNOSIS — K2270 Barrett's esophagus without dysplasia: Secondary | ICD-10-CM

## 2023-02-21 NOTE — Progress Notes (Signed)
 Primary Care Physician:  Antonetta Rollene BRAVO, MD Primary Gastroenterologist:  Dr. Shaaron  Pre-Procedure History & Physical: HPI:  Renee Waters is a 78 y.o. female here for follow-up of opioid-induced constipation,reflux, weight loss. GERD well-controlled on twice daily Protonix . States Movantik  no longer needed; Renee Waters bowel function has normalized.  She still takes occasional opioid therapy.  She is reportedly on hospice although fairly stable given Renee Waters history of myelodysplastic syndrome/neoplasm.  Lost 25 pounds last year but she is rebounded by 8 pounds based on our scale since last October.  History of short segment Barrett's esophagus; she has undergone multiple EGDs  - no dysplasia.  History of colonic adenomas. No dysphagia.   no rectal bleeding.  It has been previously decided that future surveillance examinations deferred in this clinical setting.   Past Medical History:  Diagnosis Date   Acute cholangitis    Acute respiratory failure with hypoxia (HCC) 09/01/2021   Allergy    Anemia    Anxiety    Arthritis    Phreesia 03/18/2020   Barrett's esophagus    Cataract    Chronic back pain    Chronic neck pain    CKD (chronic kidney disease) stage 3, GFR 30-59 ml/min (HCC) 10/29/2020   Depression    Genital herpes    GERD (gastroesophageal reflux disease)    H/O degenerative disc disease    History of blood transfusion    Hypertension    Hypoxia 12/01/2021   Insomnia    Lupus (systemic lupus erythematosus) (HCC)    Neuromuscular disorder (HCC)    Osteoarthritis    S/P colonoscopy 07/2003   normal, no polyps   S/P endoscopy June 2005, Oct 2009   2005: short-segment Barrett's, 2009: short-segment Barrett's   Upper respiratory tract infection 07/09/2020   UTI (lower urinary tract infection) 11/2012    Past Surgical History:  Procedure Laterality Date   ABDOMINAL HYSTERECTOMY     BACK SURGERY     BIOPSY N/A 03/20/2014   Procedure: BIOPSY;  Surgeon: Lamar CHRISTELLA Shaaron,  MD;  Location: [facility];  Service: Endoscopy;  Laterality: N/A;   BIOPSY  09/14/2015   Procedure: BIOPSY;  Surgeon: Lamar CHRISTELLA Shaaron, MD;  Location[facility];  Service: Endoscopy;;  esophageal and gastric   BIOPSY  07/23/2019   Procedure: BIOPSY;  Surgeon: Shaaron Lamar CHRISTELLA, MD;  Location: [facility];  Service: Endoscopy;;  esophageal    CARPAL TUNNEL RELEASE Left 2013   cervical disectomy  2002   CESAREAN SECTION N/A    Phreesia 03/18/2020   CHOLECYSTECTOMY     with lysis of adhesions for sbo; ruptured gallbladder.   COLONOSCOPY  11/09/2011   RMR: Melanosis coli   COLONOSCOPY WITH PROPOFOL  N/A 09/14/2015   Dr. Shaaron: diverticulosis, 3mm TA removed. next TCS 09/2020.    COLONOSCOPY WITH PROPOFOL  N/A 04/30/2020   Procedure: COLONOSCOPY WITH PROPOFOL ;  Surgeon: Shaaron Lamar CHRISTELLA, MD;  [facility];  Service: Endoscopy;  Laterality: N/A;  PM (ASA 3)   DENTAL SURGERY  11/2015   multiple tooth extraction   ESOPHAGOGASTRODUODENOSCOPY  11/29/2007   salmon-colored  tongue   longest stable at  3 cm, distal esophagus as described previously status post biopsy/ Hiatal hernia, otherwise normal stomach D1 and D2   ESOPHAGOGASTRODUODENOSCOPY  01/06/11   short segment Barrett's esophagus s/p bx/Hiatal hernia   ESOPHAGOGASTRODUODENOSCOPY (EGD) WITH PROPOFOL  N/A 03/20/2014   MFM:[id number] distal esophagus short segment barrett's, bx with no dysplasia. next egd in 03/2017  ESOPHAGOGASTRODUODENOSCOPY (EGD) WITH PROPOFOL  N/A 09/14/2015   Dr. Shaaron: Barrett's without dysplasia, gastritis benign bx, hiatal hernia. next EGD 09/2018.   ESOPHAGOGASTRODUODENOSCOPY (EGD) WITH PROPOFOL  N/A 07/23/2019   Procedure: ESOPHAGOGASTRODUODENOSCOPY (EGD) WITH PROPOFOL ;  Surgeon: Shaaron Lamar HERO, MD;  [facility];  Service: Endoscopy;  Laterality: N/A;  3:00pm   EYE SURGERY N/A    Phreesia 03/18/2020   HERNIA REPAIR Right 07/2010   Dr. Blase   IR IMAGING GUIDED PORT INSERTION  02/09/2021   JOINT REPLACEMENT      LAPAROSCOPIC CHOLECYSTECTOMY  2017   at [facility]   POLYPECTOMY  09/14/2015   Procedure: POLYPECTOMY;  Surgeon: Lamar HERO Shaaron, MD;  Location: [facility];  Service: Endoscopy;;  ascending colon   right hip replacement  07/2010   went back in sept 2012 to fix   SHOULDER ARTHROSCOPY  2008   left   SPINE SURGERY N/A    Phreesia 03/18/2020   TOTAL HIP REVISION Right 12/17/2012   Procedure: RIGHT TOTAL HIP REVISION;  Surgeon: Donnice JONETTA Car, MD;  Location: [facility];  Service: Orthopedics;  Laterality: Right;   WRIST SURGERY Right 2011   open reduction right wrist.    Prior to Admission medications   Medication Sig Start Date End Date Taking? Authorizing Provider  acyclovir  (ZOVIRAX ) 800 MG tablet Take 1 tablet (800 mg total) by mouth 5 (five) times daily as needed. 10/27/22  Yes Antonetta Rollene BRAVO, MD  allopurinol  (ZYLOPRIM ) 100 MG tablet Take 2 tablets by mouth once daily 02/14/23  Yes Rogers Hai, MD  EPINEPHrine  0.3 mg/0.3 mL IJ SOAJ injection INJECT 0.3 MLS INTO MUSCLE ONCE AS NEEDED 01/11/22  Yes Golda Lynwood PARAS, MD  hydrOXYzine  (ATARAX ) 50 MG tablet Take 1 tablet by mouth three times daily as needed 02/14/23  Yes Rogers Hai, MD  lidocaine -prilocaine  (EMLA ) cream Apply 1 Application topically as needed (Access to port and pain). 11/15/21  Yes Katragadda, Sreedhar, MD  metoprolol  tartrate (LOPRESSOR ) 25 MG tablet TAKE 1 & 1/2 (ONE & ONE-HALF) TABLETS BY MOUTH TWICE DAILY 02/20/23  Yes Antonetta Rollene BRAVO, MD  mirtazapine  (REMERON ) 15 MG tablet Take 15 mg by mouth at bedtime. 12/01/22  Yes [provider]  momelotinib dihydrochloride  (OJJAARA ) 100 MG tablet Take 1 tablet (100 mg total) by mouth daily. 12/27/22  Yes Katragadda, Sreedhar, MD  Multiple Vitamin (MULTIVITAMIN WITH MINERALS) TABS tablet Take 1 tablet by mouth daily. 10/25/22  Yes Antonetta Rollene BRAVO, MD  oxyCODONE  (ROXICODONE ) 15 MG immediate release tablet Take 15 mg by mouth every 6 (six) hours as needed.  01/19/22  Yes [provider]  pantoprazole  (PROTONIX ) 40 MG tablet Take 1 tablet (40 mg total) by mouth 2 (two) times daily before a meal. 11/14/22  Yes Waynetta Metheny, Lamar HERO, MD  potassium chloride  (KLOR-CON ) 10 MEQ tablet Take 1 tablet by mouth once daily 02/14/23  Yes Katragadda, Sreedhar, MD  pregabalin  (LYRICA ) 25 MG capsule Take 1 capsule (25 mg total) by mouth daily. 12/01/22  Yes Antonetta Rollene BRAVO, MD  prochlorperazine  (COMPAZINE ) 10 MG tablet TAKE 1 TABLET BY MOUTH EVERY 6 HOURS AS NEEDED FOR NAUSEA OR VOMITING 11/16/22  Yes Rogers Hai, MD  temazepam  (RESTORIL ) 30 MG capsule Take 1 capsule (30 mg total) by mouth at bedtime. 12/01/22  Yes Antonetta Rollene BRAVO, MD  triamcinolone  ointment (KENALOG ) 0.5 % Apply topically daily. 12/22/22  Yes [provider]  trimethoprim  (TRIMPEX ) 100 MG tablet Take 1 tablet (100 mg total) by mouth daily. 08/30/22  Yes  Gerldine Lauraine BROCKS, FNP    Allergies as of 02/21/2023 - Review Complete 02/21/2023  Allergen Reaction Noted   Bee venom Swelling and Hives 06/12/2012   Norvasc  [amlodipine ] Other (See Comments) 09/16/2019   Tylenol  [acetaminophen ] Itching 09/16/2019   Hydralazine  Other (See Comments) 01/07/2022   Red dye #40 (allura red) Itching 10/19/2021   Percocet [oxycodone -acetaminophen ] Rash and Other (See Comments) 01/26/2015   Tyloxapol Itching and Rash 12/10/2018    Family History  Problem Relation Age of Onset   Hypertension Mother    Stroke Mother    Colon cancer Neg Hx    Anesthesia problems Neg Hx    Hypotension Neg Hx    Malignant hyperthermia Neg Hx    Pseudochol deficiency Neg Hx    Gastric cancer Neg Hx    Esophageal cancer Neg Hx     Social History   Socioeconomic History   Marital status: Married    Spouse name: louis   Number of children: 4   Years of education: 12+   Highest education level: Some college, no degree  Occupational History   Occupation: audiological scientist  - retired   Occupation: non profit   Tobacco Use   Smoking status: Former    Current packs/day: 0.00    Average packs/day: 0.3 packs/day for 25.0 years (6.3 ttl pk-yrs)    Types: Cigarettes    Start date: 02/06/1978    Quit date: 02/07/2003    Years since quitting: 20.0   Smokeless tobacco: Never   Tobacco comments:    quit in 2004  Vaping Use   Vaping status: Never Used  Substance and Sexual Activity   Alcohol use: No   Drug use: No   Sexual activity: Yes    Birth control/protection: Surgical  Other Topics Concern   Not on file  Social History Narrative   Not on file   Social Drivers of Health   Financial Resource Strain: High Risk (02/06/2023)   Overall Financial Resource Strain (CARDIA)    Difficulty of Paying Living Expenses: Hard  Food Insecurity: No Food Insecurity (02/06/2023)   Hunger Vital Sign    Worried About Running Out of Food in the Last Year: Never true    Ran Out of Food in the Last Year: Never true  Transportation Needs: No Transportation Needs (02/06/2023)   PRAPARE - Administrator, Civil Service (Medical): No    Lack of Transportation (Non-Medical): No  Physical Activity: Patient Declined (02/06/2023)   Exercise Vital Sign    Days of Exercise per Week: Patient declined    Minutes of Exercise per Session: Patient declined  Stress: No Stress Concern Present (02/06/2023)   [employer] of Occupational Health - Occupational Stress Questionnaire    Feeling of Stress : Not at all  Social Connections: Moderately Isolated (02/06/2023)   Social Connection and Isolation Panel [NHANES]    Frequency of Communication with Friends and Family: More than three times a week    Frequency of Social Gatherings with Friends and Family: Once a week    Attends Religious Services: Never    Database Administrator or Organizations: No    Attends Banker Meetings: Never    Marital Status: Married  Catering Manager Violence: Not At Risk (02/06/2023)   Humiliation, Afraid, Rape,  and Kick questionnaire    Fear of Current or Ex-Partner: No    Emotionally Abused: No    Physically Abused: No    Sexually Abused: No    Review  of Systems: See HPI, otherwise negative ROS  Physical Exam: BP 134/77 (BP Location: Left Arm, Patient Position: Sitting, Cuff Size: Normal)   Pulse (!) 57   Temp 98.1 F (36.7 C) (Oral)   Ht 5' 5 (1.651 m)   Wt 125 lb 3.2 oz (56.8 kg)   SpO2 100%   BMI 20.83 kg/m  General:   Alert, somewhat frail.  Pleasant and cooperative.  Accompanied by Renee Waters husband.Heart:  Regular rate and rhythm; no murmurs, clicks, rubs,  or gallops. Abdomen: Non-distended, normal bowel sounds.  Soft and nontender without appreciable mass or hepatosplenomegaly.   Impression/Plan: Pleasant 78 year old lady with myelodysplastic syndrome, GERD and opioid induced constipation.  By Renee Waters report, she is having good bowel function daily  - no longer taking Movantik .  GERD well-controlled on twice daily PPI.  May be able to de-escalate to once daily.  Weight gain reassuring.  History of short segment Barrett's esophagus and colonic adenomas.  As discussed with the patient and Renee Waters husband, makes the most sense to defer future surveillance examinations.  Recommendations:  As discussed, may be able to leave off second dose of Protonix  daily.  If once a day treatment is not enough, can go back to twice a day.  This medication is best taken 30 minutes before meal.  No need for repeat EGD or colonoscopy at this time.  Office visit with us  in 6 months.    Notice: This dictation was prepared with Dragon dictation along with smaller phrase technology. Any transcriptional errors that result from this process are unintentional and may not be corrected upon review.

## 2023-02-21 NOTE — Patient Instructions (Signed)
 It was good to see you again today  Glad to see you are gaining weight  As discussed, you may be able to leave off your second dose of Protonix  daily.  If once a day treatment is not enough you can go back to twice a day.  This medication is best taken 30 minutes before meal.  So long as your bowel function has improved you can leave Movantik  off.  No need for repeat EGD or colonoscopy at this time.  Office visit with us  in 6 months.

## 2023-02-24 ENCOUNTER — Other Ambulatory Visit: Payer: Self-pay | Admitting: Family Medicine

## 2023-02-24 ENCOUNTER — Other Ambulatory Visit (HOSPITAL_COMMUNITY): Payer: Self-pay

## 2023-02-24 ENCOUNTER — Other Ambulatory Visit: Payer: Self-pay

## 2023-02-25 ENCOUNTER — Other Ambulatory Visit (HOSPITAL_COMMUNITY): Payer: Self-pay

## 2023-02-27 ENCOUNTER — Other Ambulatory Visit: Payer: Self-pay

## 2023-02-27 ENCOUNTER — Other Ambulatory Visit (HOSPITAL_COMMUNITY): Payer: Self-pay

## 2023-02-27 MED ORDER — TRIAMCINOLONE ACETONIDE 0.5 % EX OINT
TOPICAL_OINTMENT | Freq: Every day | CUTANEOUS | 0 refills | Status: AC
  Filled 2023-02-27: qty 30, 30d supply, fill #0

## 2023-02-28 ENCOUNTER — Other Ambulatory Visit (HOSPITAL_COMMUNITY): Payer: Self-pay

## 2023-03-01 ENCOUNTER — Inpatient Hospital Stay: Payer: Medicare Other | Attending: Hematology

## 2023-03-01 DIAGNOSIS — D471 Chronic myeloproliferative disease: Secondary | ICD-10-CM | POA: Diagnosis not present

## 2023-03-01 DIAGNOSIS — D649 Anemia, unspecified: Secondary | ICD-10-CM

## 2023-03-01 LAB — SAMPLE TO BLOOD BANK

## 2023-03-01 LAB — CBC WITH DIFFERENTIAL/PLATELET
Abs Immature Granulocytes: 0 10*3/uL (ref 0.00–0.07)
Band Neutrophils: 9 %
Basophils Absolute: 0.3 10*3/uL — ABNORMAL HIGH (ref 0.0–0.1)
Basophils Relative: 2 %
Eosinophils Absolute: 0.3 10*3/uL (ref 0.0–0.5)
Eosinophils Relative: 2 %
HCT: 27 % — ABNORMAL LOW (ref 36.0–46.0)
Hemoglobin: 8.6 g/dL — ABNORMAL LOW (ref 12.0–15.0)
Lymphocytes Relative: 8 %
Lymphs Abs: 1 10*3/uL (ref 0.7–4.0)
MCH: 30.8 pg (ref 26.0–34.0)
MCHC: 31.9 g/dL (ref 30.0–36.0)
MCV: 96.8 fL (ref 80.0–100.0)
Monocytes Absolute: 0.6 10*3/uL (ref 0.1–1.0)
Monocytes Relative: 5 %
Neutro Abs: 10.5 10*3/uL — ABNORMAL HIGH (ref 1.7–7.7)
Neutrophils Relative %: 74 %
Platelets: 174 10*3/uL (ref 150–400)
RBC: 2.79 MIL/uL — ABNORMAL LOW (ref 3.87–5.11)
RDW: 17.4 % — ABNORMAL HIGH (ref 11.5–15.5)
WBC: 12.6 10*3/uL — ABNORMAL HIGH (ref 4.0–10.5)
nRBC: 1.4 % — ABNORMAL HIGH (ref 0.0–0.2)
nRBC: 4 /100{WBCs} — ABNORMAL HIGH

## 2023-03-01 MED ORDER — SODIUM CHLORIDE 0.9% FLUSH
10.0000 mL | INTRAVENOUS | Status: AC
  Administered 2023-03-01: 10 mL

## 2023-03-01 MED ORDER — HEPARIN SOD (PORK) LOCK FLUSH 100 UNIT/ML IV SOLN
500.0000 [IU] | Freq: Once | INTRAVENOUS | Status: AC
  Administered 2023-03-01: 500 [IU] via INTRAVENOUS

## 2023-03-01 NOTE — Progress Notes (Signed)
Lorenza Burton presented for Portacath access and flush with BBS. Portacath located right chest wall accessed with  H 20 needle.  Good blood return present. Portacath flushed with 20ml NS and 500U/18ml Heparin and needle removed intact.  Procedure tolerated well and without incident. Discharged from clinic by wheel chair in stable condition. Alert and oriented x 3. F/U with [facility] as scheduled.

## 2023-03-02 ENCOUNTER — Inpatient Hospital Stay: Payer: Medicare Other

## 2023-03-08 DIAGNOSIS — F411 Generalized anxiety disorder: Secondary | ICD-10-CM | POA: Diagnosis not present

## 2023-03-08 DIAGNOSIS — F330 Major depressive disorder, recurrent, mild: Secondary | ICD-10-CM | POA: Diagnosis not present

## 2023-03-14 ENCOUNTER — Encounter: Payer: Self-pay | Admitting: Family Medicine

## 2023-03-16 MED ORDER — PREGABALIN 25 MG PO CAPS: 25.0000 mg | ORAL_CAPSULE | Freq: Two times a day (BID) | ORAL | 3 refills | Status: DC

## 2023-03-19 ENCOUNTER — Other Ambulatory Visit: Payer: Self-pay | Admitting: Hematology

## 2023-03-20 DIAGNOSIS — Z515 Encounter for palliative care: Secondary | ICD-10-CM | POA: Diagnosis not present

## 2023-03-20 DIAGNOSIS — D47Z9 Other specified neoplasms of uncertain behavior of lymphoid, hematopoietic and related tissue: Secondary | ICD-10-CM | POA: Diagnosis not present

## 2023-03-21 ENCOUNTER — Other Ambulatory Visit: Payer: Self-pay

## 2023-03-21 ENCOUNTER — Other Ambulatory Visit: Payer: Self-pay | Admitting: Family Medicine

## 2023-03-27 ENCOUNTER — Other Ambulatory Visit: Payer: Self-pay

## 2023-03-27 NOTE — Progress Notes (Signed)
Specialty Pharmacy Refill Coordination Note  Renee Waters is a 78 y.o. female contacted today regarding refills of specialty medication(s) Momelotinib Dihydrochloride [name])   Patient requested Delivery   Delivery date: 04/03/23   Verified address: [address], [place] [address]   Medication will be filled on 03/31/23.

## 2023-04-04 DIAGNOSIS — F331 Major depressive disorder, recurrent, moderate: Secondary | ICD-10-CM | POA: Diagnosis not present

## 2023-04-04 DIAGNOSIS — F411 Generalized anxiety disorder: Secondary | ICD-10-CM | POA: Diagnosis not present

## 2023-04-06 ENCOUNTER — Inpatient Hospital Stay: Payer: Medicare Other | Attending: Hematology

## 2023-04-06 DIAGNOSIS — D649 Anemia, unspecified: Secondary | ICD-10-CM

## 2023-04-06 DIAGNOSIS — D471 Chronic myeloproliferative disease: Secondary | ICD-10-CM | POA: Diagnosis not present

## 2023-04-06 LAB — CBC WITH DIFFERENTIAL/PLATELET
Abs Immature Granulocytes: 0.8 10*3/uL — ABNORMAL HIGH (ref 0.00–0.07)
Band Neutrophils: 5 %
Basophils Absolute: 0 10*3/uL (ref 0.0–0.1)
Basophils Relative: 0 %
Eosinophils Absolute: 0 10*3/uL (ref 0.0–0.5)
Eosinophils Relative: 0 %
HCT: 27.1 % — ABNORMAL LOW (ref 36.0–46.0)
Hemoglobin: 8.4 g/dL — ABNORMAL LOW (ref 12.0–15.0)
Lymphocytes Relative: 9 %
Lymphs Abs: 1.2 10*3/uL (ref 0.7–4.0)
MCH: 30 pg (ref 26.0–34.0)
MCHC: 31 g/dL (ref 30.0–36.0)
MCV: 96.8 fL (ref 80.0–100.0)
Metamyelocytes Relative: 2 %
Monocytes Absolute: 0.7 10*3/uL (ref 0.1–1.0)
Monocytes Relative: 5 %
Myelocytes: 4 %
Neutro Abs: 10.6 10*3/uL — ABNORMAL HIGH (ref 1.7–7.7)
Neutrophils Relative %: 75 %
Platelets: 258 10*3/uL (ref 150–400)
RBC: 2.8 MIL/uL — ABNORMAL LOW (ref 3.87–5.11)
RDW: 18.7 % — ABNORMAL HIGH (ref 11.5–15.5)
Smear Review: ADEQUATE
WBC: 13.3 10*3/uL — ABNORMAL HIGH (ref 4.0–10.5)
nRBC: 1.8 % — ABNORMAL HIGH (ref 0.0–0.2)
nRBC: 2 /100{WBCs} — ABNORMAL HIGH

## 2023-04-06 LAB — COMPREHENSIVE METABOLIC PANEL
ALT: 16 U/L (ref 0–44)
AST: 22 U/L (ref 15–41)
Albumin: 3.6 g/dL (ref 3.5–5.0)
Alkaline Phosphatase: 112 U/L (ref 38–126)
Anion gap: 8 (ref 5–15)
BUN: 33 mg/dL — ABNORMAL HIGH (ref 8–23)
CO2: 22 mmol/L (ref 22–32)
Calcium: 8.7 mg/dL — ABNORMAL LOW (ref 8.9–10.3)
Chloride: 107 mmol/L (ref 98–111)
Creatinine, Ser: 1.86 mg/dL — ABNORMAL HIGH (ref 0.44–1.00)
GFR, Estimated: 28 mL/min — ABNORMAL LOW (ref >60)
Glucose, Bld: 126 mg/dL — ABNORMAL HIGH (ref 70–99)
Potassium: 3.9 mmol/L (ref 3.5–5.1)
Sodium: 137 mmol/L (ref 135–145)
Total Bilirubin: 0.5 mg/dL (ref 0.0–1.2)
Total Protein: 7.6 g/dL (ref 6.5–8.1)

## 2023-04-06 LAB — SAMPLE TO BLOOD BANK

## 2023-04-06 MED ORDER — HEPARIN SOD (PORK) LOCK FLUSH 100 UNIT/ML IV SOLN
500.0000 [IU] | Freq: Once | INTRAVENOUS | Status: AC
  Administered 2023-04-06: 500 [IU] via INTRAVENOUS

## 2023-04-06 MED ORDER — SODIUM CHLORIDE 0.9% FLUSH
10.0000 mL | Freq: Once | INTRAVENOUS | Status: AC
  Administered 2023-04-06: 10 mL via INTRAVENOUS

## 2023-04-06 NOTE — Progress Notes (Signed)
 Patients port flushed without difficulty.  Good blood return noted with no bruising or swelling noted at site.  Band aid applied.  VSS with discharge and left in satisfactory condition with no s/s of distress noted.

## 2023-04-19 ENCOUNTER — Other Ambulatory Visit (HOSPITAL_COMMUNITY): Payer: Self-pay

## 2023-04-19 ENCOUNTER — Other Ambulatory Visit: Payer: Self-pay | Admitting: Hematology

## 2023-04-20 ENCOUNTER — Encounter: Payer: Self-pay | Admitting: Family Medicine

## 2023-04-20 ENCOUNTER — Ambulatory Visit (HOSPITAL_COMMUNITY)
Admission: RE | Admit: 2023-04-20 | Discharge: 2023-04-20 | Disposition: A | Discharge status: Home / Self Care | Source: Ambulatory Visit | Attending: Family Medicine | Admitting: Family Medicine

## 2023-04-20 ENCOUNTER — Ambulatory Visit (INDEPENDENT_AMBULATORY_CARE_PROVIDER_SITE_OTHER): Payer: Medicare Other | Admitting: Family Medicine

## 2023-04-20 VITALS — BP 160/80 | HR 59 | Resp 16 | Ht 65.0 in | Wt 128.8 lb

## 2023-04-20 DIAGNOSIS — M5440 Lumbago with sciatica, unspecified side: Secondary | ICD-10-CM

## 2023-04-20 DIAGNOSIS — D471 Chronic myeloproliferative disease: Secondary | ICD-10-CM | POA: Diagnosis not present

## 2023-04-20 DIAGNOSIS — I10 Essential (primary) hypertension: Secondary | ICD-10-CM

## 2023-04-20 DIAGNOSIS — R2681 Unsteadiness on feet: Secondary | ICD-10-CM

## 2023-04-20 DIAGNOSIS — G8929 Other chronic pain: Secondary | ICD-10-CM | POA: Diagnosis not present

## 2023-04-20 DIAGNOSIS — F331 Major depressive disorder, recurrent, moderate: Secondary | ICD-10-CM | POA: Diagnosis not present

## 2023-04-20 DIAGNOSIS — R519 Headache, unspecified: Secondary | ICD-10-CM | POA: Insufficient documentation

## 2023-04-20 DIAGNOSIS — G44311 Acute post-traumatic headache, intractable: Secondary | ICD-10-CM | POA: Insufficient documentation

## 2023-04-20 DIAGNOSIS — F411 Generalized anxiety disorder: Secondary | ICD-10-CM | POA: Diagnosis not present

## 2023-04-20 MED ORDER — METOPROLOL TARTRATE 25 MG PO TABS: ORAL_TABLET | ORAL | 1 refills | Status: DC

## 2023-04-20 NOTE — Patient Instructions (Addendum)
 F/U in 12 to 14 weeks, call if you need me sooner  Head scan ordered hopefully you will have this today  Pls consider alternate laundry arrangement  Need to commit to lyrica two time daily , this will help with pain management  Thanks for choosing [facility], we consider it a privelige to serve you.

## 2023-04-20 NOTE — Assessment & Plan Note (Addendum)
 No focal or neuro deficits noted, hit head 3 times in past several weeks, head CT scan to evaluate

## 2023-04-21 ENCOUNTER — Encounter: Payer: Self-pay | Admitting: Family Medicine

## 2023-04-21 ENCOUNTER — Other Ambulatory Visit: Payer: Self-pay | Admitting: Hematology

## 2023-04-21 ENCOUNTER — Telehealth: Payer: Self-pay | Admitting: Family Medicine

## 2023-04-21 NOTE — Telephone Encounter (Signed)
  Copied from CRM [id number]. Topic: General - Other >> Apr 21, 2023  9:05 AM Franchot Heidelberg wrote: Reason for CRM: Pt's daughter called requesting a doctor's note from the appt yesterday. Requesting to have this uploaded via mychart or faxed  Fax: [phone]

## 2023-04-21 NOTE — Telephone Encounter (Signed)
 Work note sent to [employer]

## 2023-04-24 ENCOUNTER — Other Ambulatory Visit: Payer: Self-pay

## 2023-04-25 DIAGNOSIS — Z515 Encounter for palliative care: Secondary | ICD-10-CM | POA: Diagnosis not present

## 2023-04-25 DIAGNOSIS — D47Z9 Other specified neoplasms of uncertain behavior of lymphoid, hematopoietic and related tissue: Secondary | ICD-10-CM | POA: Diagnosis not present

## 2023-04-26 ENCOUNTER — Other Ambulatory Visit: Payer: Self-pay

## 2023-04-26 ENCOUNTER — Other Ambulatory Visit: Payer: Self-pay | Admitting: Hematology

## 2023-04-26 ENCOUNTER — Other Ambulatory Visit (HOSPITAL_COMMUNITY): Payer: Self-pay

## 2023-04-26 DIAGNOSIS — D471 Chronic myeloproliferative disease: Secondary | ICD-10-CM

## 2023-04-26 MED ORDER — MOMELOTINIB DIHYDROCHLORIDE 100 MG PO TABS
100.0000 mg | ORAL_TABLET | Freq: Every day | ORAL | 3 refills | Status: DC
  Filled 2023-04-26: qty 30, 30d supply, fill #0
  Filled 2023-05-31 (×2): qty 30, 30d supply, fill #1
  Filled 2023-06-23: qty 30, 30d supply, fill #2
  Filled 2023-07-26: qty 30, 30d supply, fill #3

## 2023-04-26 NOTE — Progress Notes (Signed)
 Specialty Pharmacy Refill Coordination Note  Renee Waters is a 78 y.o. female contacted today regarding refills of specialty medication(s) Momelotinib Dihydrochloride [name])   Patient requested Delivery   Delivery date: 05/04/23   Verified address: [name] [place] [address]   Medication will be filled on 03.26.25.     This fill date is pending response to refill request from provider. Patient is aware and if they have not received fill by intended date they must follow up with pharmacy.

## 2023-05-04 ENCOUNTER — Inpatient Hospital Stay: Payer: Medicare Other

## 2023-05-04 ENCOUNTER — Other Ambulatory Visit: Payer: Self-pay

## 2023-05-04 ENCOUNTER — Other Ambulatory Visit (HOSPITAL_COMMUNITY): Payer: Self-pay

## 2023-05-04 ENCOUNTER — Inpatient Hospital Stay: Payer: Medicare Other | Attending: Hematology | Admitting: Hematology

## 2023-05-04 ENCOUNTER — Other Ambulatory Visit: Payer: Self-pay | Admitting: *Deleted

## 2023-05-04 VITALS — Wt 128.0 lb

## 2023-05-04 DIAGNOSIS — E876 Hypokalemia: Secondary | ICD-10-CM | POA: Diagnosis not present

## 2023-05-04 DIAGNOSIS — D471 Chronic myeloproliferative disease: Secondary | ICD-10-CM | POA: Insufficient documentation

## 2023-05-04 DIAGNOSIS — R61 Generalized hyperhidrosis: Secondary | ICD-10-CM | POA: Diagnosis not present

## 2023-05-04 DIAGNOSIS — R2681 Unsteadiness on feet: Secondary | ICD-10-CM

## 2023-05-04 DIAGNOSIS — R162 Hepatomegaly with splenomegaly, not elsewhere classified: Secondary | ICD-10-CM | POA: Diagnosis not present

## 2023-05-04 DIAGNOSIS — Y92009 Unspecified place in unspecified non-institutional (private) residence as the place of occurrence of the external cause: Secondary | ICD-10-CM

## 2023-05-04 DIAGNOSIS — Z7409 Other reduced mobility: Secondary | ICD-10-CM

## 2023-05-04 DIAGNOSIS — Z79899 Other long term (current) drug therapy: Secondary | ICD-10-CM | POA: Insufficient documentation

## 2023-05-04 DIAGNOSIS — M546 Pain in thoracic spine: Secondary | ICD-10-CM

## 2023-05-04 DIAGNOSIS — D649 Anemia, unspecified: Secondary | ICD-10-CM

## 2023-05-04 DIAGNOSIS — R52 Pain, unspecified: Secondary | ICD-10-CM

## 2023-05-04 DIAGNOSIS — M25551 Pain in right hip: Secondary | ICD-10-CM

## 2023-05-04 DIAGNOSIS — G8929 Other chronic pain: Secondary | ICD-10-CM

## 2023-05-04 DIAGNOSIS — R634 Abnormal weight loss: Secondary | ICD-10-CM | POA: Diagnosis not present

## 2023-05-04 DIAGNOSIS — L299 Pruritus, unspecified: Secondary | ICD-10-CM | POA: Insufficient documentation

## 2023-05-04 DIAGNOSIS — R161 Splenomegaly, not elsewhere classified: Secondary | ICD-10-CM

## 2023-05-04 DIAGNOSIS — I10 Essential (primary) hypertension: Secondary | ICD-10-CM

## 2023-05-04 DIAGNOSIS — N189 Chronic kidney disease, unspecified: Secondary | ICD-10-CM | POA: Diagnosis not present

## 2023-05-04 LAB — COMPREHENSIVE METABOLIC PANEL WITH GFR
ALT: 12 U/L (ref 0–44)
AST: 23 U/L (ref 15–41)
Albumin: 3.6 g/dL (ref 3.5–5.0)
Alkaline Phosphatase: 106 U/L (ref 38–126)
Anion gap: 10 (ref 5–15)
BUN: 28 mg/dL — ABNORMAL HIGH (ref 8–23)
CO2: 22 mmol/L (ref 22–32)
Calcium: 8.8 mg/dL — ABNORMAL LOW (ref 8.9–10.3)
Chloride: 105 mmol/L (ref 98–111)
Creatinine, Ser: 1.78 mg/dL — ABNORMAL HIGH (ref 0.44–1.00)
GFR, Estimated: 29 mL/min — ABNORMAL LOW (ref >60)
Glucose, Bld: 112 mg/dL — ABNORMAL HIGH (ref 70–99)
Potassium: 3.7 mmol/L (ref 3.5–5.1)
Sodium: 137 mmol/L (ref 135–145)
Total Bilirubin: 0.4 mg/dL (ref 0.0–1.2)
Total Protein: 8 g/dL (ref 6.5–8.1)

## 2023-05-04 LAB — CBC WITH DIFFERENTIAL/PLATELET
Abs Immature Granulocytes: 0.5 10*3/uL — ABNORMAL HIGH (ref 0.00–0.07)
Band Neutrophils: 4 %
Basophils Absolute: 0 10*3/uL (ref 0.0–0.1)
Basophils Relative: 0 %
Eosinophils Absolute: 0 10*3/uL (ref 0.0–0.5)
Eosinophils Relative: 0 %
HCT: 28.6 % — ABNORMAL LOW (ref 36.0–46.0)
Hemoglobin: 8.9 g/dL — ABNORMAL LOW (ref 12.0–15.0)
Lymphocytes Relative: 13 %
Lymphs Abs: 2.1 10*3/uL (ref 0.7–4.0)
MCH: 29.7 pg (ref 26.0–34.0)
MCHC: 31.1 g/dL (ref 30.0–36.0)
MCV: 95.3 fL (ref 80.0–100.0)
Metamyelocytes Relative: 1 %
Monocytes Absolute: 0.8 10*3/uL (ref 0.1–1.0)
Monocytes Relative: 5 %
Myelocytes: 2 %
Neutro Abs: 13 10*3/uL — ABNORMAL HIGH (ref 1.7–7.7)
Neutrophils Relative %: 75 %
Platelets: 245 10*3/uL (ref 150–400)
RBC: 3 MIL/uL — ABNORMAL LOW (ref 3.87–5.11)
RDW: 18.8 % — ABNORMAL HIGH (ref 11.5–15.5)
Smear Review: ADEQUATE
WBC: 16.5 10*3/uL — ABNORMAL HIGH (ref 4.0–10.5)
nRBC: 1.8 % — ABNORMAL HIGH (ref 0.0–0.2)
nRBC: 4 /100{WBCs} — ABNORMAL HIGH

## 2023-05-04 LAB — SAMPLE TO BLOOD BANK

## 2023-05-04 MED ORDER — HEPARIN SOD (PORK) LOCK FLUSH 100 UNIT/ML IV SOLN
500.0000 [IU] | Freq: Once | INTRAVENOUS | Status: AC
  Administered 2023-05-04: 500 [IU] via INTRAVENOUS

## 2023-05-04 MED ORDER — SODIUM CHLORIDE 0.9% FLUSH
10.0000 mL | Freq: Once | INTRAVENOUS | Status: AC
  Administered 2023-05-04: 10 mL via INTRAVENOUS

## 2023-05-04 NOTE — Patient Instructions (Signed)
 [facility] [place] - A DEPT OF [facility]  Discharge Instructions: Thank you for choosing [facility] to provide your oncology and hematology care.  If you have a lab appointment with the Cancer Center - please note that after April 8th, 2024, all labs will be drawn in the cancer center.  You do not have to check in or register with the main entrance as you have in the past but will complete your check-in in the cancer center.  Wear comfortable clothing and clothing appropriate for easy access to any Portacath or PICC line.   We strive to give you quality time with your provider. You may need to reschedule your appointment if you arrive late (15 or more minutes).  Arriving late affects you and other patients whose appointments are after yours.  Also, if you miss three or more appointments without notifying the office, you may be dismissed from the clinic at the provider's discretion.      For prescription refill requests, have your pharmacy contact our office and allow 72 hours for refills to be completed.    Today you received the following chemotherapy and/or immunotherapy agents port flush labs      To help prevent nausea and vomiting after your treatment, we encourage you to take your nausea medication as directed.  BELOW ARE SYMPTOMS THAT SHOULD BE REPORTED IMMEDIATELY: *FEVER GREATER THAN 100.4 F (38 C) OR HIGHER *CHILLS OR SWEATING *NAUSEA AND VOMITING THAT IS NOT CONTROLLED WITH YOUR NAUSEA MEDICATION *UNUSUAL SHORTNESS OF BREATH *UNUSUAL BRUISING OR BLEEDING *URINARY PROBLEMS (pain or burning when urinating, or frequent urination) *BOWEL PROBLEMS (unusual diarrhea, constipation, pain near the anus) TENDERNESS IN MOUTH AND THROAT WITH OR WITHOUT PRESENCE OF ULCERS (sore throat, sores in mouth, or a toothache) UNUSUAL RASH, SWELLING OR PAIN  UNUSUAL VAGINAL DISCHARGE OR ITCHING   Items with * indicate a potential emergency and should be  followed up as soon as possible or go to the Emergency Department if any problems should occur.  Please show the CHEMOTHERAPY ALERT CARD or IMMUNOTHERAPY ALERT CARD at check-in to the Emergency Department and triage nurse.  Should you have questions after your visit or need to cancel or reschedule your appointment, please contact [facility] [place] - A DEPT OF [facility] [phone]  and follow the prompts.  Office hours are 8:00 a.m. to 4:30 p.m. Monday - Friday. Please note that voicemails left after 4:00 p.m. may not be returned until the following business day.  We are closed weekends and major holidays. You have access to a nurse at all times for urgent questions. Please call the main number to the clinic [phone] and follow the prompts.  For any non-urgent questions, you may also contact your provider using MyChart. We now offer e-Visits for anyone 19 and older to request care online for non-urgent symptoms. For details visit [email or web address].   Also download the MyChart app! Go to the app store, search "MyChart", open the app, select [place], and log in with your MyChart username and password.

## 2023-05-04 NOTE — Progress Notes (Signed)
 PT referral placed per Dr. Marice Potter recommendation.

## 2023-05-04 NOTE — Progress Notes (Signed)
 Patients port flushed without difficulty.  Good blood return noted with no bruising or swelling noted at site.  Band aid applied.  VSS with discharge and left in satisfactory condition with no s/s of distress noted.

## 2023-05-04 NOTE — Progress Notes (Signed)
Patient is taking Ojjaara as prescribed.  She has not missed any doses and reports no side effects at this time.   

## 2023-05-04 NOTE — Progress Notes (Signed)
 [facility] [address], [place] [address]    Clinic Day:  05/04/2023  Referring physician: Kerri Perches, MD  Patient Care Team: Kerri Perches, MD as PCP - General Mallipeddi, Orion Modest, MD as PCP - Cardiology (Cardiology) Jena Gauss Gerrit Friends, MD (Gastroenterology) Vickki Hearing, MD as Consulting Physician (Orthopedic Surgery) [name], MD as Medical Oncologist (Oncology) Therese Sarah, RN as Oncology Nurse Navigator (Oncology) Clinton Gallant, RN as [facility] (Ophthalmology)   ASSESSMENT & PLAN:   Assessment: 1. MPL positive prefibrotic/early primary myelofibrosis: - CBC on 11/06/2020 with white count 75.6, differential 59% neutrophils, 12% monocytes, 4% lymphocytes, 1 percentage of eosinophils and basophils, 6% band neutrophils, 12% myelocytes, 1% promyelocytes, 29% blasts. - Pathologist review of blood smear reported as leukoerythroblastic reaction. - 25 pound weight loss in the last 6 months, unintentional.  Decreased appetite.  Reports fatigue for the last few months.  Reports night sweats x3 in the last 1 month. - Bone marrow biopsy on 11/18/2020 consistent with granulocytic proliferation with differential including CMML versus myeloproliferative disorder. - BCR/ABL was negative. 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 - NGS myeloid panel shows mutations in ASXL1, MPL, TET2, EZH2 mutations. - Spleen ultrasound on 12/16/2020 shows mildly enlarged measuring 12.4 x 9.6 x 7 cm with volume of 435 cc. - PDGFR alpha, beta and FGFR 1 was negative. - She was evaluated by Dr. Lowell Guitar at [facility].  Slides were reviewed at [facility] hematopathology.  They thought it was less likely CMML and more likely MPL positive myeloproliferative neoplasm.  Dr. Lowell Guitar has recommended initiate Jakafi. - Given anemia, B symptoms, cytogenetics with MPL, ASLX1,  normal karyotype she is intermediate risk on GIPPS at high risk MIPSS70+ - Ruxolitinib 10 mg twice daily started on 02/16/2021.  Dose increased to 15 mg twice daily on 05/18/2021. - CTAP on 04/07/2021: Hepatomegaly and splenomegaly measuring 14.3 cm.  No other pathology.  No spleen infarcts. -Ruxolitinib dose increased to 15 mg twice daily on 09/20/2021. - Ultrasound spleen (09/21/2021): 13.6 cm with volume 532 mL.  This is slightly improved from previous volume. - Bone marrow biopsy (09/09/2021): Material is limited but overall features consistent with myeloid neoplasm.  No increase in blasts.  Erythroid precursors appear decreased but megakaryocytes are variably evident with clusters and small forms.  Reticulin's shows variable increase in reticulin fibers including areas with prominent increase.  Chromosome analysis was normal. - Tapering doses of ruxolitinib started on 12/24/2021, last dose on 01/23/2022 - Evaluated by Dr. Lowell Guitar on 12/24/2021. - Momelotinib 100 mg daily started on 01/26/2022, dose increased to 150 mg daily on 07/19/2022 due to generalized itching.  Dose decreased to 100 mg daily on 08/09/2022.  2.  Social/family history: - She lives at home and is able to do all her ADLs and IADLs although she is getting tired lately.  She reports quitting smoking 6 months ago and smoked 1 pack/week for 43 years. - She believes that her mother had some kind of leukemia.  No other malignancies.    Plan: MPL positive myeloproliferative neoplasm: - She is tolerating momelotinib 100 mg daily reasonably well. - She recently developed frontal headaches.  Dr. Lodema Hong has done a CT head on 04/20/2023 which showed no acute intracranial abnormality. - She also reported fatigue slightly worse in the last 2 to 3 weeks.  She also has pains in the right knee and hip region.  She has some generalized itching occasionally but fairly well-controlled. - Physical exam: Spleen palpable 2 to 3 cm below the left costal  margin. - Reviewed labs from today: LFTs are normal.  Creatinine stable at 1.78.  Hemoglobin is 8.9 with white count 16.5 and platelet count normal. - Will repeat CBC in 6 weeks.  If it drops below 8, will consider ESA/Luspatercept.  Will check baseline EPO level.  RTC 12 weeks for follow-up.  Will repeat ultrasound spleen for volume measurement along with routine labs.   2.  Decreased appetite: - She is eating reasonably well and has maintained her weight.  3.  CKD: - Creatinine is 1.76 and stable.  Will closely monitor.  4.  Hypokalemia: - Potassium is 3.7.  Continue potassium daily.   5.  Generalized itching: - Although generalized itching has improved since momelotinib started, she has it occasionally but for short duration.    Orders Placed This Encounter  Procedures   US SPLEEN (ABDOMEN LIMITED)    Standing Status:   Future    Expiration Date:   05/03/2024    Reason for Exam (SYMPTOM  OR DIAGNOSIS REQUIRED):   splenomegaly    Preferred imaging location?:   [facility]   CBC with Differential    Standing Status:   Future    Expected Date:   07/31/2023    Expiration Date:   05/03/2024   Comprehensive metabolic panel    Standing Status:   Future    Expected Date:   07/31/2023    Expiration Date:   05/03/2024   Lactate dehydrogenase    Standing Status:   Future    Expected Date:   07/31/2023    Expiration Date:   05/03/2024   Erythropoietin    Standing Status:   Future    Expected Date:   07/31/2023    Expiration Date:   05/03/2024   CBC with Differential    Standing Status:   Future    Expected Date:   06/12/2023    Expiration Date:   05/03/2024      Mikeal Hawthorne R Teague,acting as a scribe for [name], MD.,have documented all relevant documentation on the behalf of [name], MD,as directed by  [name], MD while in the presence of [name], MD.  I, [name] MD, have reviewed the above documentation for accuracy and  completeness, and I agree with the above.     [name], MD   3/27/20253:51 PM  CHIEF COMPLAINT:   Diagnosis: MPL positive myeloproliferative neoplasm    Cancer Staging  No matching staging information was found for the patient.    Prior Therapy: Jakafi 10 mg twice daily   Current Therapy:  Momelotinib 100 mg daily    HISTORY OF PRESENT ILLNESS:   Oncology History   No history exists.     INTERVAL HISTORY:   Irine is a 79 y.o. female presenting to clinic today for follow up of MPL positive myeloproliferative neoplasm. She was last seen by me on 01/26/23.  Today, she states that she is doing well overall. Her appetite level is at 75%. Her energy level is at 75%. She is accompanied by a family member.   She notes new onset weakness and sleepiness. She also reports new onset pain in the head, as well as the right hip and lower extremity. Her left knee pain has also worsened. Her weakness began over a month ago and has worsened over the past 2 weeks. She attempts to  work in her garden for a short amount of time daily, but is otherwise inactive. Athene previously had physical therapy, which improved strength, but has not received physical therapy recently. She notes headaches and pain upon palpation she attributes to hitting the area when doing laundry.   Delsie notes a decreased appetite and food intake. She is taking potassium as prescribed. She occasionally has generalized itching throughout the body and denies any aquagenic pruritus.  She is tolerating Momelotinib well and denies missing any doses.   PAST MEDICAL HISTORY:   Past Medical History: Past Medical History:  Diagnosis Date   Acute cholangitis    Acute respiratory failure with hypoxia (HCC) 09/01/2021   Allergy    Anemia    Anxiety    Arthritis    Phreesia 03/18/2020   Barrett's esophagus    Cataract    Chronic back pain    Chronic neck pain    CKD (chronic kidney disease) stage 3, GFR  30-59 ml/min (HCC) 10/29/2020   Depression    Genital herpes    GERD (gastroesophageal reflux disease)    H/O degenerative disc disease    History of blood transfusion    Hypertension    Hypoxia 12/01/2021   Insomnia    Lupus (systemic lupus erythematosus) (HCC)    Neuromuscular disorder (HCC)    Osteoarthritis    S/P colonoscopy 07/2003   normal, no polyps   S/P endoscopy June 2005, Oct 2009   2005: short-segment Barrett's, 2009: short-segment Barrett's   Upper respiratory tract infection 07/09/2020   UTI (lower urinary tract infection) 11/2012    Surgical History: Past Surgical History:  Procedure Laterality Date   ABDOMINAL HYSTERECTOMY     BACK SURGERY     BIOPSY N/A 03/20/2014   Procedure: BIOPSY;  Surgeon: Corbin Ade, MD;  Location: [facility];  Service: Endoscopy;  Laterality: N/A;   BIOPSY  09/14/2015   Procedure: BIOPSY;  Surgeon: Corbin Ade, MD;  Location: [facility];  Service: Endoscopy;;  esophageal and gastric   BIOPSY  07/23/2019   Procedure: BIOPSY;  Surgeon: Corbin Ade, MD;  Location: [facility];  Service: Endoscopy;;  esophageal    CARPAL TUNNEL RELEASE Left 2013   cervical disectomy  2002   CESAREAN SECTION N/A    Phreesia 03/18/2020   CHOLECYSTECTOMY     with lysis of adhesions for sbo; "ruptured gallbladder".   COLONOSCOPY  11/09/2011   RMR: Melanosis coli   COLONOSCOPY WITH PROPOFOL N/A 09/14/2015   Dr. Jena Gauss: diverticulosis, 3mm TA removed. next TCS 09/2020.    COLONOSCOPY WITH PROPOFOL N/A 04/30/2020   Procedure: COLONOSCOPY WITH PROPOFOL;  Surgeon: Corbin Ade, MD;  Location: [facility];  Service: Endoscopy;  Laterality: N/A;  PM (ASA 3)   DENTAL SURGERY  11/2015   multiple tooth extraction   ESOPHAGOGASTRODUODENOSCOPY  11/29/2007   salmon-colored  tongue   longest stable at  3 cm, distal esophagus as described previously status post biopsy/ Hiatal hernia, otherwise normal stomach D1 and D2   ESOPHAGOGASTRODUODENOSCOPY  01/06/11    short segment Barrett's esophagus s/p bx/Hiatal hernia   ESOPHAGOGASTRODUODENOSCOPY (EGD) WITH PROPOFOL N/A 03/20/2014   [id number] distal esophagus short segment barrett's, bx with no dysplasia. next egd in 03/2017   ESOPHAGOGASTRODUODENOSCOPY (EGD) WITH PROPOFOL N/A 09/14/2015   Dr. Jena Gauss: Barrett's without dysplasia, gastritis benign bx, hiatal hernia. next EGD 09/2018.   ESOPHAGOGASTRODUODENOSCOPY (EGD) WITH PROPOFOL N/A 07/23/2019   Procedure: ESOPHAGOGASTRODUODENOSCOPY (EGD) WITH PROPOFOL;  Surgeon: Jena Gauss,  Gerrit Friends, MD;  Location: [facility];  Service: Endoscopy;  Laterality: N/A;  3:00pm   EYE SURGERY N/A    Phreesia 03/18/2020   HERNIA REPAIR Right 07/2010   Dr. Arlean Hopping   IR IMAGING GUIDED PORT INSERTION  02/09/2021   JOINT REPLACEMENT     LAPAROSCOPIC CHOLECYSTECTOMY  2017   at [facility]   POLYPECTOMY  09/14/2015   Procedure: POLYPECTOMY;  Surgeon: Corbin Ade, MD;  Location: [facility];  Service: Endoscopy;;  ascending colon   right hip replacement  07/2010   went back in sept 2012 to fix   SHOULDER ARTHROSCOPY  2008   left   SPINE SURGERY N/A    Phreesia 03/18/2020   TOTAL HIP REVISION Right 12/17/2012   Procedure: RIGHT TOTAL HIP REVISION;  Surgeon: [name], MD;  Location: [facility];  Service: Orthopedics;  Laterality: Right;   WRIST SURGERY Right 2011   open reduction right wrist.    Social History: Social History   Socioeconomic History   Marital status: Married    Spouse name: louis   Number of children: 4   Years of education: 12+   Highest education level: Some college, no degree  Occupational History   Occupation: Audiological scientist  - retired   Occupation: non profit  Tobacco Use   Smoking status: Former    Current packs/day: 0.00    Average packs/day: 0.3 packs/day for 25.0 years (6.3 ttl pk-yrs)    Types: Cigarettes    Start date: 02/06/1978    Quit date: 02/07/2003    Years since quitting: 20.2   Smokeless tobacco: Never   Tobacco comments:    quit  in 2004  Vaping Use   Vaping status: Never Used  Substance and Sexual Activity   Alcohol use: No   Drug use: No   Sexual activity: Yes    Birth control/protection: Surgical  Other Topics Concern   Not on file  Social History Narrative   Not on file   Social Drivers of Health   Financial Resource Strain: High Risk (02/06/2023)   Overall Financial Resource Strain (CARDIA)    Difficulty of Paying Living Expenses: Hard  Food Insecurity: No Food Insecurity (02/06/2023)   Hunger Vital Sign    Worried About Running Out of Food in the Last Year: Never true    Ran Out of Food in the Last Year: Never true  Transportation Needs: No Transportation Needs (02/06/2023)   PRAPARE - Administrator, Civil Service (Medical): No    Lack of Transportation (Non-Medical): No  Physical Activity: Patient Declined (02/06/2023)   Exercise Vital Sign    Days of Exercise per Week: Patient declined    Minutes of Exercise per Session: Patient declined  Stress: No Stress Concern Present (02/06/2023)   [employer] of Occupational Health - Occupational Stress Questionnaire    Feeling of Stress : Not at all  Social Connections: Moderately Isolated (02/06/2023)   Social Connection and Isolation Panel [NHANES]    Frequency of Communication with Friends and Family: More than three times a week    Frequency of Social Gatherings with Friends and Family: Once a week    Attends Religious Services: Never    Database administrator or Organizations: No    Attends Banker Meetings: Never    Marital Status: Married  Catering manager Violence: Not At Risk (02/06/2023)   Humiliation, Afraid, Rape, and Kick questionnaire    Fear of Current or Ex-Partner: No  Emotionally Abused: No    Physically Abused: No    Sexually Abused: No    Family History: Family History  Problem Relation Age of Onset   Hypertension Mother    Stroke Mother    Colon cancer Neg Hx    Anesthesia problems Neg  Hx    Hypotension Neg Hx    Malignant hyperthermia Neg Hx    Pseudochol deficiency Neg Hx    Gastric cancer Neg Hx    Esophageal cancer Neg Hx     Current Medications:  Current Outpatient Medications:    acyclovir (ZOVIRAX) 800 MG tablet, Take 1 tablet (800 mg total) by mouth 5 (five) times daily as needed., Disp: 30 tablet, Rfl: 0   allopurinol (ZYLOPRIM) 100 MG tablet, Take 2 tablets by mouth once daily, Disp: 60 tablet, Rfl: 0   EPINEPHrine 0.3 mg/0.3 mL IJ SOAJ injection, INJECT 0.3 MLS INTO MUSCLE ONCE AS NEEDED, Disp: 1 each, Rfl: 2   hydrOXYzine (ATARAX) 50 MG tablet, Take 1 tablet by mouth three times daily as needed, Disp: 90 tablet, Rfl: 0   lidocaine-prilocaine (EMLA) cream, APPLY TOPICALLY AS NEEDED TO ACCESS PORT, Disp: 30 g, Rfl: 5   metoprolol tartrate (LOPRESSOR) 25 MG tablet, Take half tablet by mouthtwo times daily, Disp: 90 tablet, Rfl: 1   mirtazapine (REMERON) 15 MG tablet, TAKE 1 TABLET BY MOUTH AT BEDTIME, Disp: 30 tablet, Rfl: 5   momelotinib dihydrochloride (OJJAARA) 100 MG tablet, Take 1 tablet (100 mg total) by mouth daily., Disp: 30 tablet, Rfl: 3   MOVANTIK 25 MG TABS tablet, Take 25 mg by mouth daily., Disp: , Rfl:    Multiple Vitamin (MULTIVITAMIN WITH MINERALS) TABS tablet, Take 1 tablet by mouth daily., Disp: 130 tablet, Rfl: 1   oxyCODONE (ROXICODONE) 15 MG immediate release tablet, Take 15 mg by mouth every 6 (six) hours as needed., Disp: , Rfl:    pantoprazole (PROTONIX) 40 MG tablet, Take 1 tablet (40 mg total) by mouth 2 (two) times daily before a meal., Disp: 60 tablet, Rfl: 11   potassium chloride (KLOR-CON) 10 MEQ tablet, Take 1 tablet by mouth once daily, Disp: 30 tablet, Rfl: 0   pregabalin (LYRICA) 25 MG capsule, Take 1 capsule (25 mg total) by mouth 2 (two) times daily., Disp: 60 capsule, Rfl: 3   prochlorperazine (COMPAZINE) 10 MG tablet, TAKE 1 TABLET BY MOUTH EVERY 6 HOURS AS NEEDED FOR NAUSEA OR VOMITING, Disp: 60 tablet, Rfl: 0   temazepam  (RESTORIL) 30 MG capsule, Take 1 capsule (30 mg total) by mouth at bedtime., Disp: 30 capsule, Rfl: 5   triamcinolone ointment (KENALOG) 0.5 %, Apply topically daily., Disp: 30 g, Rfl: 0   trimethoprim (TRIMPEX) 100 MG tablet, Take 1 tablet (100 mg total) by mouth daily., Disp: 30 tablet, Rfl: 11 No current facility-administered medications for this visit.  Facility-Administered Medications Ordered in Other Visits:    lanreotide acetate (SOMATULINE DEPOT) 120 MG/0.5ML injection, , , ,    lanreotide acetate (SOMATULINE DEPOT) 120 MG/0.5ML injection, , , ,    lanreotide acetate (SOMATULINE DEPOT) 120 MG/0.5ML injection, , , ,    octreotide (SANDOSTATIN LAR) 30 MG IM injection, , , ,    octreotide (SANDOSTATIN LAR) 30 MG IM injection, , , ,    Allergies: Allergies  Allergen Reactions   Bee Venom Swelling and Hives   Norvasc [Amlodipine] Other (See Comments)    Hair loss   Tylenol [Acetaminophen] Itching   Hydralazine Other (See Comments)  Hair loss   Red Dye #40 (Allura Red) Itching   Percocet [Oxycodone-Acetaminophen] Rash and Other (See Comments)    Pt states, "this gives her a rash, but at home she takes oxycodone for pain relief"    Tyloxapol Itching and Rash    REVIEW OF SYSTEMS:   Review of Systems  Constitutional:  Positive for fatigue. Negative for chills and fever.  HENT:   Positive for sore throat. Negative for lump/mass, mouth sores, nosebleeds and trouble swallowing.   Eyes:  Negative for eye problems.  Respiratory:  Negative for cough and shortness of breath.   Cardiovascular:  Negative for chest pain, leg swelling and palpitations.  Gastrointestinal:  Negative for abdominal pain, constipation, diarrhea, nausea and vomiting.  Genitourinary:  Negative for bladder incontinence, difficulty urinating, dysuria, frequency, hematuria and nocturia.   Musculoskeletal:  Positive for back pain (7/10 severity). Negative for arthralgias, flank pain, myalgias and neck pain.        +bilateral leg pain, 7/10 severity  Skin:  Negative for itching and rash.  Neurological:  Positive for headaches. Negative for dizziness and numbness.  Hematological:  Does not bruise/bleed easily.  Psychiatric/Behavioral:  Negative for depression, sleep disturbance and suicidal ideas. The patient is not nervous/anxious.   All other systems reviewed and are negative.    VITALS:   Weight 128 lb (58.1 kg).  Wt Readings from Last 3 Encounters:  05/04/23 128 lb (58.1 kg)  04/20/23 128 lb 12.8 oz (58.4 kg)  02/21/23 125 lb 3.2 oz (56.8 kg)    Body mass index is 21.3 kg/m.  Performance status (ECOG): 1 - Symptomatic but completely ambulatory  PHYSICAL EXAM:   Physical Exam Vitals and nursing note reviewed. Exam conducted with a chaperone present.  Constitutional:      Appearance: Normal appearance.  Cardiovascular:     Rate and Rhythm: Normal rate and regular rhythm.     Pulses: Normal pulses.     Heart sounds: Normal heart sounds.  Pulmonary:     Effort: Pulmonary effort is normal.     Breath sounds: Normal breath sounds.  Abdominal:     Palpations: Abdomen is soft. There is splenomegaly (2 fingerbreadths below the left costal margin). There is no hepatomegaly or mass.     Tenderness: There is no abdominal tenderness.  Musculoskeletal:     Right lower leg: No edema.     Left lower leg: No edema.  Lymphadenopathy:     Cervical: No cervical adenopathy.     Right cervical: No superficial, deep or posterior cervical adenopathy.    Left cervical: No superficial, deep or posterior cervical adenopathy.     Upper Body:     Right upper body: No supraclavicular or axillary adenopathy.     Left upper body: No supraclavicular or axillary adenopathy.  Neurological:     General: No focal deficit present.     Mental Status: She is alert and oriented to person, place, and time.  Psychiatric:        Mood and Affect: Mood normal.        Behavior: Behavior normal.     LABS:       Latest Ref Rng & Units 05/04/2023   10:34 AM 04/06/2023   11:50 AM 03/01/2023   11:16 AM  CBC  WBC 4.0 - 10.5 K/uL 16.5  13.3  12.6   Hemoglobin 12.0 - 15.0 g/dL 8.9  8.4  8.6   Hematocrit 36.0 - 46.0 % 28.6  27.1  27.0  Platelets 150 - 400 K/uL 245  258  174       Latest Ref Rng & Units 05/04/2023   10:34 AM 04/06/2023   11:50 AM 01/26/2023   11:05 AM  CMP  Glucose 70 - 99 mg/dL 409  811  914   BUN 8 - 23 mg/dL 28  33  29   Creatinine 0.44 - 1.00 mg/dL 7.82  9.56  2.13   Sodium 135 - 145 mmol/L 137  137  138   Potassium 3.5 - 5.1 mmol/L 3.7  3.9  4.1   Chloride 98 - 111 mmol/L 105  107  106   CO2 22 - 32 mmol/L 22  22  23    Calcium 8.9 - 10.3 mg/dL 8.8  8.7  8.9   Total Protein 6.5 - 8.1 g/dL 8.0  7.6  7.5   Total Bilirubin 0.0 - 1.2 mg/dL 0.4  0.5  0.6   Alkaline Phos 38 - 126 U/L 106  112  101   AST 15 - 41 U/L 23  22  21    ALT 0 - 44 U/L 12  16  19       No results found for: "CEA1", "CEA" / No results found for: "CEA1", "CEA" No results found for: "PSA1" No results found for: "YQM578" No results found for: "CAN125"  No results found for: "TOTALPROTELP", "ALBUMINELP", "A1GS", "A2GS", "BETS", "BETA2SER", "GAMS", "MSPIKE", "SPEI" Lab Results  Component Value Date   TIBC 329 08/25/2015   TIBC 336 02/21/2011   FERRITIN 26 08/25/2015   FERRITIN 81 02/21/2011   IRONPCTSAT 8 (L) 08/25/2015   IRONPCTSAT 23 02/21/2011   Lab Results  Component Value Date   LDH 260 (H) 01/26/2023   LDH 588 (H) 07/19/2022   LDH 620 (H) 06/07/2022     STUDIES:   CT HEAD WO CONTRAST ( ) Result Date: 04/20/2023 CLINICAL DATA:  Headache. EXAM: CT HEAD WITHOUT CONTRAST TECHNIQUE: Contiguous axial images were obtained from the base of the skull through the vertex without intravenous contrast. RADIATION DOSE REDUCTION: This exam was performed according to the departmental dose-optimization program which includes automated exposure control, adjustment of the mA and/or kV according to patient  size and/or use of iterative reconstruction technique. COMPARISON:  Head CT dated 07/21/2022. FINDINGS: Brain: Moderate age-related atrophy and chronic microvascular ischemic changes. There is no acute intracranial hemorrhage. No mass effect or midline shift. No extra-axial fluid collection. Vascular: No hyperdense vessel or unexpected calcification. Skull: Normal. Negative for fracture or focal lesion. Sinuses/Orbits: No acute finding. Other: None IMPRESSION: 1. No acute intracranial pathology. 2. Moderate age-related atrophy and chronic microvascular ischemic changes. Electronically Signed   By: Elgie Collard M.D.   On: 04/20/2023 18:21

## 2023-05-04 NOTE — Patient Instructions (Addendum)
 [facility] at [facility] Discharge Instructions   You were seen and examined today by Dr. Ellin Saba.  He reviewed the results of your lab work which are normal/stable.   Continue Ojjaara as prescribed.   We will see you back in 12 weeks.   Return as scheduled.    Thank you for choosing [facility] at [facility] to provide your oncology and hematology care.  To afford each patient quality time with our provider, please arrive at least 15 minutes before your scheduled appointment time.   If you have a lab appointment with the Cancer Center please come in thru the Main Entrance and check in at the main information desk.  You need to re-schedule your appointment should you arrive 10 or more minutes late.  We strive to give you quality time with our providers, and arriving late affects you and other patients whose appointments are after yours.  Also, if you no show three or more times for appointments you may be dismissed from the clinic at the providers discretion.     Again, thank you for choosing [facility].  Our hope is that these requests will decrease the amount of time that you wait before being seen by our physicians.       _____________________________________________________________  Should you have questions after your visit to [facility], please contact our office at [phone] and follow the prompts.  Our office hours are 8:00 a.m. and 4:30 p.m. Monday - Friday.  Please note that voicemails left after 4:00 p.m. may not be returned until the following business day.  We are closed weekends and major holidays.  You do have access to a nurse 24-7, just call the main number to the clinic [phone] and do not press any options, hold on the line and a nurse will answer the phone.    For prescription refill requests, have your pharmacy contact our office and allow 72 hours.    Due to Covid, you will need to  wear a mask upon entering the hospital. If you do not have a mask, a mask will be given to you at the Main Entrance upon arrival. For doctor visits, patients may have 1 support person age 55 or older with them. For treatment visits, patients can not have anyone with them due to social distancing guidelines and our immunocompromised population.

## 2023-05-05 DIAGNOSIS — M542 Cervicalgia: Secondary | ICD-10-CM | POA: Diagnosis not present

## 2023-05-05 DIAGNOSIS — N179 Acute kidney failure, unspecified: Secondary | ICD-10-CM | POA: Diagnosis not present

## 2023-05-05 DIAGNOSIS — N184 Chronic kidney disease, stage 4 (severe): Secondary | ICD-10-CM | POA: Diagnosis not present

## 2023-05-05 DIAGNOSIS — J439 Emphysema, unspecified: Secondary | ICD-10-CM | POA: Diagnosis not present

## 2023-05-05 DIAGNOSIS — K2270 Barrett's esophagus without dysplasia: Secondary | ICD-10-CM | POA: Diagnosis not present

## 2023-05-05 DIAGNOSIS — E876 Hypokalemia: Secondary | ICD-10-CM | POA: Diagnosis not present

## 2023-05-05 DIAGNOSIS — F331 Major depressive disorder, recurrent, moderate: Secondary | ICD-10-CM | POA: Diagnosis not present

## 2023-05-05 DIAGNOSIS — D471 Chronic myeloproliferative disease: Secondary | ICD-10-CM | POA: Diagnosis not present

## 2023-05-05 DIAGNOSIS — F5105 Insomnia due to other mental disorder: Secondary | ICD-10-CM | POA: Diagnosis not present

## 2023-05-05 DIAGNOSIS — D509 Iron deficiency anemia, unspecified: Secondary | ICD-10-CM | POA: Diagnosis not present

## 2023-05-05 DIAGNOSIS — Z87440 Personal history of urinary (tract) infections: Secondary | ICD-10-CM | POA: Diagnosis not present

## 2023-05-05 DIAGNOSIS — N319 Neuromuscular dysfunction of bladder, unspecified: Secondary | ICD-10-CM | POA: Diagnosis not present

## 2023-05-05 DIAGNOSIS — M1990 Unspecified osteoarthritis, unspecified site: Secondary | ICD-10-CM | POA: Diagnosis not present

## 2023-05-05 DIAGNOSIS — Z87891 Personal history of nicotine dependence: Secondary | ICD-10-CM | POA: Diagnosis not present

## 2023-05-05 DIAGNOSIS — G8929 Other chronic pain: Secondary | ICD-10-CM | POA: Diagnosis not present

## 2023-05-05 DIAGNOSIS — F419 Anxiety disorder, unspecified: Secondary | ICD-10-CM | POA: Diagnosis not present

## 2023-05-05 DIAGNOSIS — D631 Anemia in chronic kidney disease: Secondary | ICD-10-CM | POA: Diagnosis not present

## 2023-05-05 DIAGNOSIS — Z9181 History of falling: Secondary | ICD-10-CM | POA: Diagnosis not present

## 2023-05-05 DIAGNOSIS — D630 Anemia in neoplastic disease: Secondary | ICD-10-CM | POA: Diagnosis not present

## 2023-05-05 DIAGNOSIS — Z556 Problems related to health literacy: Secondary | ICD-10-CM | POA: Diagnosis not present

## 2023-05-05 DIAGNOSIS — I700 Atherosclerosis of aorta: Secondary | ICD-10-CM | POA: Diagnosis not present

## 2023-05-05 DIAGNOSIS — I080 Rheumatic disorders of both mitral and aortic valves: Secondary | ICD-10-CM | POA: Diagnosis not present

## 2023-05-09 DIAGNOSIS — N184 Chronic kidney disease, stage 4 (severe): Secondary | ICD-10-CM | POA: Diagnosis not present

## 2023-05-09 DIAGNOSIS — J439 Emphysema, unspecified: Secondary | ICD-10-CM | POA: Diagnosis not present

## 2023-05-09 DIAGNOSIS — F5105 Insomnia due to other mental disorder: Secondary | ICD-10-CM | POA: Diagnosis not present

## 2023-05-09 DIAGNOSIS — D471 Chronic myeloproliferative disease: Secondary | ICD-10-CM | POA: Diagnosis not present

## 2023-05-09 DIAGNOSIS — N179 Acute kidney failure, unspecified: Secondary | ICD-10-CM | POA: Diagnosis not present

## 2023-05-09 DIAGNOSIS — I700 Atherosclerosis of aorta: Secondary | ICD-10-CM | POA: Diagnosis not present

## 2023-05-15 DIAGNOSIS — N179 Acute kidney failure, unspecified: Secondary | ICD-10-CM | POA: Diagnosis not present

## 2023-05-15 DIAGNOSIS — F5105 Insomnia due to other mental disorder: Secondary | ICD-10-CM | POA: Diagnosis not present

## 2023-05-15 DIAGNOSIS — J439 Emphysema, unspecified: Secondary | ICD-10-CM | POA: Diagnosis not present

## 2023-05-15 DIAGNOSIS — N184 Chronic kidney disease, stage 4 (severe): Secondary | ICD-10-CM | POA: Diagnosis not present

## 2023-05-15 DIAGNOSIS — D471 Chronic myeloproliferative disease: Secondary | ICD-10-CM | POA: Diagnosis not present

## 2023-05-15 DIAGNOSIS — I700 Atherosclerosis of aorta: Secondary | ICD-10-CM | POA: Diagnosis not present

## 2023-05-16 DIAGNOSIS — D47Z9 Other specified neoplasms of uncertain behavior of lymphoid, hematopoietic and related tissue: Secondary | ICD-10-CM | POA: Diagnosis not present

## 2023-05-16 DIAGNOSIS — Z515 Encounter for palliative care: Secondary | ICD-10-CM | POA: Diagnosis not present

## 2023-05-17 ENCOUNTER — Other Ambulatory Visit: Payer: Self-pay | Admitting: Family Medicine

## 2023-05-17 ENCOUNTER — Telehealth: Payer: Self-pay

## 2023-05-17 ENCOUNTER — Other Ambulatory Visit: Payer: Self-pay | Admitting: Hematology

## 2023-05-17 NOTE — Telephone Encounter (Signed)
 Patient was identified as falling into the [place] Measure - Diabetes.   Patient was: Appointment already scheduled for:  07/13/23.

## 2023-05-18 DIAGNOSIS — F411 Generalized anxiety disorder: Secondary | ICD-10-CM | POA: Diagnosis not present

## 2023-05-19 ENCOUNTER — Encounter: Payer: Self-pay | Admitting: Family Medicine

## 2023-05-19 NOTE — Assessment & Plan Note (Signed)
 Uncontrolled , inc lyrica to twice dail as prescribed

## 2023-05-19 NOTE — Assessment & Plan Note (Signed)
 In active treatment with marked improvement in WBC count in past 8 months

## 2023-05-19 NOTE — Assessment & Plan Note (Addendum)
 Elelvated at visit , need sto take  metoprolo as prescribed  has been taking half dose

## 2023-05-19 NOTE — Progress Notes (Signed)
   Renee Waters     MRN: [id number]      DOB: March 10, 1945  Chief Complaint  Patient presents with   Head Injury    States she hit her head on dryer in laundry room most recently Monday and she has been having a headache since    Medication Problem    States the metoprolol bottle says 1.5 twice daily but she says she was told to take 0.5. She wants to discuss    HPI [name] is here for an acute visit with the above concerns Injury occurred when taking clothes out of the dryer. No bruise or laceration, no lOC, no change in memory noted C/o uncontrolled pain but has not been taking lyrica as prescribed ROS Denies recent fever or chills. Denies sinus pressure, nasal congestion, ear pain or sore throat. Denies chest congestion, productive cough or wheezing. Denies chest pains, palpitations and leg swelling Denies abdominal pain, nausea, vomiting,diarrhea or constipation.   Denies dysuria, frequency, hesitancy or incontinence. chronic joint pain, swelling and limitation in mobility. C/o  headaches, , numbness, ands  tingling. Denies  uncontrolled depression, anxiety or insomnia. Denies skin break down or rash.   PE  BP (!) 160/80   Pulse (!) 59   Resp 16   Ht 5\' 5"  (1.651 m)   Wt 128 lb 12.8 oz (58.4 kg)   SpO2 95%   BMI 21.43 kg/m   Patient alert and oriented and in no cardiopulmonary distress.  HEENT: No facial asymmetry, EOMI,     Neck decreased ROM Chest: Clear to auscultation bilaterally.  CVS: S1, S2 no murmurs, no S3.Regular rate.  ABD: Soft non tender.   Ext: No edema  MS: decreased ROM spine, shoulders, hips and knees.  Skin: Intact, no ulcerations or rash noted.  Psych: Good eye contact, normal affect. Memory impaired, baseline, nildly anxious not  depressed appearing.  CNS: CN 2-12 intact, power,  normal throughout.no focal deficits noted.   Assessment & Plan  Headache No focal or neuro deficits noted, hit head 3 times in past several weeks, head  CT scan to evaluate   Essential hypertension Elelvated at visit , need sto take  metoprolo as prescribed  has been taking half dose  MPN (myeloproliferative neoplasm) (HCC) In active treatment with marked improvement in WBC count in past 8 months  Chronic midline low back pain with sciatica Uncontrolled , inc lyrica to twice dail as prescribed  Unsteady gait Reports 3 falls in past 3 months, PT offered  andis indicated holding off at this time

## 2023-05-19 NOTE — Assessment & Plan Note (Signed)
 Reports 3 falls in past 3 months, PT offered  andis indicated holding off at this time

## 2023-05-22 ENCOUNTER — Other Ambulatory Visit: Payer: Self-pay | Admitting: Family Medicine

## 2023-05-23 ENCOUNTER — Other Ambulatory Visit: Payer: Self-pay | Admitting: Hematology

## 2023-05-24 ENCOUNTER — Other Ambulatory Visit: Payer: Self-pay | Admitting: Family Medicine

## 2023-05-24 DIAGNOSIS — D471 Chronic myeloproliferative disease: Secondary | ICD-10-CM | POA: Diagnosis not present

## 2023-05-24 DIAGNOSIS — F5105 Insomnia due to other mental disorder: Secondary | ICD-10-CM | POA: Diagnosis not present

## 2023-05-24 DIAGNOSIS — N184 Chronic kidney disease, stage 4 (severe): Secondary | ICD-10-CM | POA: Diagnosis not present

## 2023-05-24 DIAGNOSIS — N179 Acute kidney failure, unspecified: Secondary | ICD-10-CM | POA: Diagnosis not present

## 2023-05-24 DIAGNOSIS — J439 Emphysema, unspecified: Secondary | ICD-10-CM | POA: Diagnosis not present

## 2023-05-24 DIAGNOSIS — I700 Atherosclerosis of aorta: Secondary | ICD-10-CM | POA: Diagnosis not present

## 2023-05-24 NOTE — Telephone Encounter (Signed)
 Last Fill: 12/01/22  Last OV: 04/20/23 Next OV: 07/13/23  Routing to provider for review/authorization.      Copied from CRM [id number]. Topic: General - Other >> May 24, 2023 11:32 AM Baldemar Lev wrote: Reason for CRM: Pt called to report that she is out of her current supply of Temazepam  Advised of 72 hour window for refill

## 2023-05-26 ENCOUNTER — Other Ambulatory Visit: Payer: Self-pay | Admitting: Hematology

## 2023-05-29 ENCOUNTER — Telehealth: Payer: Self-pay

## 2023-05-29 MED ORDER — TEMAZEPAM 30 MG PO CAPS: 30.0000 mg | ORAL_CAPSULE | Freq: Every day | ORAL | 0 refills | Status: DC

## 2023-05-29 NOTE — Addendum Note (Signed)
 Addended by: Rayquon Uselman E on: 05/29/2023 12:43 PM   Modules accepted: Orders

## 2023-05-29 NOTE — Telephone Encounter (Signed)
 Requesting refill of Temazepam  (out of medication)  Last OV: 04/20/23 Next OV: 07/13/23  Wants medication sent to [facility]

## 2023-05-29 NOTE — Telephone Encounter (Signed)
 Copied from CRM [id number]. Topic: Clinical - Prescription Issue >> May 29, 2023  9:06 AM Zipporah Him wrote: Reason for CRM: temazepam  (RESTORIL ) 30 MG capsule still not refilled, waiting patient is completely out at this time. Wanting to know what the issue is please call back daughter Junette Older @ [phone]

## 2023-05-30 ENCOUNTER — Other Ambulatory Visit: Payer: Self-pay

## 2023-05-31 ENCOUNTER — Other Ambulatory Visit: Payer: Self-pay

## 2023-05-31 ENCOUNTER — Encounter: Payer: Self-pay | Admitting: *Deleted

## 2023-05-31 ENCOUNTER — Other Ambulatory Visit (HOSPITAL_COMMUNITY): Payer: Self-pay

## 2023-05-31 NOTE — Progress Notes (Signed)
 Specialty Pharmacy Refill Coordination Note  Renee Waters is a 78 y.o. female contacted today regarding refills of specialty medication(s) Ojjaara .  Patient requested (Patient-Rptd) Delivery   Delivery date: (Patient-Rptd) 06-08-2045   Verified address: (Patient-Rptd) [address] [place], [place] [address]   Medication will be filled on 06/01/23. New delivery date is 06/02/23. Patient has been notified.

## 2023-05-31 NOTE — Progress Notes (Signed)
 Shower bench ordered for patient via [employer] per Physical Therapy  request.

## 2023-06-01 ENCOUNTER — Other Ambulatory Visit: Payer: Self-pay

## 2023-06-01 DIAGNOSIS — F411 Generalized anxiety disorder: Secondary | ICD-10-CM | POA: Diagnosis not present

## 2023-06-01 DIAGNOSIS — F331 Major depressive disorder, recurrent, moderate: Secondary | ICD-10-CM | POA: Diagnosis not present

## 2023-06-01 NOTE — Progress Notes (Signed)
 Clinical Intervention Note  Clinical Intervention Notes: Patient reports initiating fentanyl  patches. No DDIs identified with Ojjaara .   Clinical Intervention Outcomes: Prevention of an adverse drug event   Rena Carnes Specialty Pharmacist

## 2023-06-02 DIAGNOSIS — N184 Chronic kidney disease, stage 4 (severe): Secondary | ICD-10-CM | POA: Diagnosis not present

## 2023-06-02 DIAGNOSIS — F5105 Insomnia due to other mental disorder: Secondary | ICD-10-CM | POA: Diagnosis not present

## 2023-06-02 DIAGNOSIS — I700 Atherosclerosis of aorta: Secondary | ICD-10-CM | POA: Diagnosis not present

## 2023-06-02 DIAGNOSIS — N179 Acute kidney failure, unspecified: Secondary | ICD-10-CM | POA: Diagnosis not present

## 2023-06-02 DIAGNOSIS — J439 Emphysema, unspecified: Secondary | ICD-10-CM | POA: Diagnosis not present

## 2023-06-02 DIAGNOSIS — D471 Chronic myeloproliferative disease: Secondary | ICD-10-CM | POA: Diagnosis not present

## 2023-06-04 DIAGNOSIS — Z9181 History of falling: Secondary | ICD-10-CM | POA: Diagnosis not present

## 2023-06-04 DIAGNOSIS — F419 Anxiety disorder, unspecified: Secondary | ICD-10-CM | POA: Diagnosis not present

## 2023-06-04 DIAGNOSIS — N319 Neuromuscular dysfunction of bladder, unspecified: Secondary | ICD-10-CM | POA: Diagnosis not present

## 2023-06-04 DIAGNOSIS — J439 Emphysema, unspecified: Secondary | ICD-10-CM | POA: Diagnosis not present

## 2023-06-04 DIAGNOSIS — D631 Anemia in chronic kidney disease: Secondary | ICD-10-CM | POA: Diagnosis not present

## 2023-06-04 DIAGNOSIS — I080 Rheumatic disorders of both mitral and aortic valves: Secondary | ICD-10-CM | POA: Diagnosis not present

## 2023-06-04 DIAGNOSIS — D509 Iron deficiency anemia, unspecified: Secondary | ICD-10-CM | POA: Diagnosis not present

## 2023-06-04 DIAGNOSIS — G8929 Other chronic pain: Secondary | ICD-10-CM | POA: Diagnosis not present

## 2023-06-04 DIAGNOSIS — Z87891 Personal history of nicotine dependence: Secondary | ICD-10-CM | POA: Diagnosis not present

## 2023-06-04 DIAGNOSIS — I700 Atherosclerosis of aorta: Secondary | ICD-10-CM | POA: Diagnosis not present

## 2023-06-04 DIAGNOSIS — E876 Hypokalemia: Secondary | ICD-10-CM | POA: Diagnosis not present

## 2023-06-04 DIAGNOSIS — D471 Chronic myeloproliferative disease: Secondary | ICD-10-CM | POA: Diagnosis not present

## 2023-06-04 DIAGNOSIS — N179 Acute kidney failure, unspecified: Secondary | ICD-10-CM | POA: Diagnosis not present

## 2023-06-04 DIAGNOSIS — D630 Anemia in neoplastic disease: Secondary | ICD-10-CM | POA: Diagnosis not present

## 2023-06-04 DIAGNOSIS — M542 Cervicalgia: Secondary | ICD-10-CM | POA: Diagnosis not present

## 2023-06-04 DIAGNOSIS — Z87440 Personal history of urinary (tract) infections: Secondary | ICD-10-CM | POA: Diagnosis not present

## 2023-06-04 DIAGNOSIS — N184 Chronic kidney disease, stage 4 (severe): Secondary | ICD-10-CM | POA: Diagnosis not present

## 2023-06-04 DIAGNOSIS — M1990 Unspecified osteoarthritis, unspecified site: Secondary | ICD-10-CM | POA: Diagnosis not present

## 2023-06-04 DIAGNOSIS — F331 Major depressive disorder, recurrent, moderate: Secondary | ICD-10-CM | POA: Diagnosis not present

## 2023-06-04 DIAGNOSIS — Z556 Problems related to health literacy: Secondary | ICD-10-CM | POA: Diagnosis not present

## 2023-06-04 DIAGNOSIS — K2270 Barrett's esophagus without dysplasia: Secondary | ICD-10-CM | POA: Diagnosis not present

## 2023-06-04 DIAGNOSIS — F5105 Insomnia due to other mental disorder: Secondary | ICD-10-CM | POA: Diagnosis not present

## 2023-06-07 DIAGNOSIS — D471 Chronic myeloproliferative disease: Secondary | ICD-10-CM | POA: Diagnosis not present

## 2023-06-07 DIAGNOSIS — Z515 Encounter for palliative care: Secondary | ICD-10-CM | POA: Diagnosis not present

## 2023-06-07 DIAGNOSIS — D47Z9 Other specified neoplasms of uncertain behavior of lymphoid, hematopoietic and related tissue: Secondary | ICD-10-CM | POA: Diagnosis not present

## 2023-06-07 DIAGNOSIS — F5105 Insomnia due to other mental disorder: Secondary | ICD-10-CM | POA: Diagnosis not present

## 2023-06-07 DIAGNOSIS — I700 Atherosclerosis of aorta: Secondary | ICD-10-CM | POA: Diagnosis not present

## 2023-06-07 DIAGNOSIS — J439 Emphysema, unspecified: Secondary | ICD-10-CM | POA: Diagnosis not present

## 2023-06-07 DIAGNOSIS — N184 Chronic kidney disease, stage 4 (severe): Secondary | ICD-10-CM | POA: Diagnosis not present

## 2023-06-07 DIAGNOSIS — N179 Acute kidney failure, unspecified: Secondary | ICD-10-CM | POA: Diagnosis not present

## 2023-06-13 DIAGNOSIS — F411 Generalized anxiety disorder: Secondary | ICD-10-CM | POA: Diagnosis not present

## 2023-06-13 DIAGNOSIS — F329 Major depressive disorder, single episode, unspecified: Secondary | ICD-10-CM | POA: Diagnosis not present

## 2023-06-14 DIAGNOSIS — D471 Chronic myeloproliferative disease: Secondary | ICD-10-CM | POA: Diagnosis not present

## 2023-06-14 DIAGNOSIS — F5105 Insomnia due to other mental disorder: Secondary | ICD-10-CM | POA: Diagnosis not present

## 2023-06-14 DIAGNOSIS — N179 Acute kidney failure, unspecified: Secondary | ICD-10-CM | POA: Diagnosis not present

## 2023-06-14 DIAGNOSIS — I700 Atherosclerosis of aorta: Secondary | ICD-10-CM | POA: Diagnosis not present

## 2023-06-14 DIAGNOSIS — N184 Chronic kidney disease, stage 4 (severe): Secondary | ICD-10-CM | POA: Diagnosis not present

## 2023-06-14 DIAGNOSIS — J439 Emphysema, unspecified: Secondary | ICD-10-CM | POA: Diagnosis not present

## 2023-06-15 ENCOUNTER — Inpatient Hospital Stay: Attending: Hematology

## 2023-06-15 DIAGNOSIS — D471 Chronic myeloproliferative disease: Secondary | ICD-10-CM | POA: Diagnosis not present

## 2023-06-15 DIAGNOSIS — D649 Anemia, unspecified: Secondary | ICD-10-CM

## 2023-06-15 LAB — CBC WITH DIFFERENTIAL/PLATELET
Abs Immature Granulocytes: 1 10*3/uL — ABNORMAL HIGH (ref 0.00–0.07)
Band Neutrophils: 1 %
Basophils Absolute: 0 10*3/uL (ref 0.0–0.1)
Basophils Relative: 0 %
Eosinophils Absolute: 0 10*3/uL (ref 0.0–0.5)
Eosinophils Relative: 0 %
HCT: 29.1 % — ABNORMAL LOW (ref 36.0–46.0)
Hemoglobin: 9.4 g/dL — ABNORMAL LOW (ref 12.0–15.0)
Lymphocytes Relative: 12 %
Lymphs Abs: 1.6 10*3/uL (ref 0.7–4.0)
MCH: 30.4 pg (ref 26.0–34.0)
MCHC: 32.3 g/dL (ref 30.0–36.0)
MCV: 94.2 fL (ref 80.0–100.0)
Metamyelocytes Relative: 6 %
Monocytes Absolute: 2.2 10*3/uL — ABNORMAL HIGH (ref 0.1–1.0)
Monocytes Relative: 16 %
Myelocytes: 1 %
Neutro Abs: 8.9 10*3/uL — ABNORMAL HIGH (ref 1.7–7.7)
Neutrophils Relative %: 64 %
Platelets: 227 10*3/uL (ref 150–400)
RBC: 3.09 MIL/uL — ABNORMAL LOW (ref 3.87–5.11)
RDW: 19.9 % — ABNORMAL HIGH (ref 11.5–15.5)
WBC: 13.7 10*3/uL — ABNORMAL HIGH (ref 4.0–10.5)
nRBC: 2 /100{WBCs} — ABNORMAL HIGH
nRBC: 2.6 % — ABNORMAL HIGH (ref 0.0–0.2)

## 2023-06-15 LAB — SAMPLE TO BLOOD BANK

## 2023-06-15 MED ORDER — SODIUM CHLORIDE 0.9% FLUSH
10.0000 mL | INTRAVENOUS | Status: DC | PRN
  Administered 2023-06-15: 10 mL via INTRAVENOUS

## 2023-06-15 MED ORDER — HEPARIN SOD (PORK) LOCK FLUSH 100 UNIT/ML IV SOLN
500.0000 [IU] | Freq: Once | INTRAVENOUS | Status: AC
Start: 2023-06-15 — End: 2023-06-15
  Administered 2023-06-15: 500 [IU] via INTRAVENOUS

## 2023-06-15 NOTE — Patient Instructions (Signed)
 [facility] [place] - A DEPT OF [facility]  Discharge Instructions: Thank you for choosing [facility] to provide your oncology and hematology care.  If you have a lab appointment with the Cancer Center - please note that after April 8th, 2024, all labs will be drawn in the cancer center.  You do not have to check in or register with the main entrance as you have in the past but will complete your check-in in the cancer center.  Wear comfortable clothing and clothing appropriate for easy access to any Portacath or PICC line.   We strive to give you quality time with your provider. You may need to reschedule your appointment if you arrive late (15 or more minutes).  Arriving late affects you and other patients whose appointments are after yours.  Also, if you miss three or more appointments without notifying the office, you may be dismissed from the clinic at the provider's discretion.      For prescription refill requests, have your pharmacy contact our office and allow 72 hours for refills to be completed.    Today you received the following: Port flush with labs   To help prevent nausea and vomiting after your treatment, we encourage you to take your nausea medication as directed.  BELOW ARE SYMPTOMS THAT SHOULD BE REPORTED IMMEDIATELY: *FEVER GREATER THAN 100.4 F (38 C) OR HIGHER *CHILLS OR SWEATING *NAUSEA AND VOMITING THAT IS NOT CONTROLLED WITH YOUR NAUSEA MEDICATION *UNUSUAL SHORTNESS OF BREATH *UNUSUAL BRUISING OR BLEEDING *URINARY PROBLEMS (pain or burning when urinating, or frequent urination) *BOWEL PROBLEMS (unusual diarrhea, constipation, pain near the anus) TENDERNESS IN MOUTH AND THROAT WITH OR WITHOUT PRESENCE OF ULCERS (sore throat, sores in mouth, or a toothache) UNUSUAL RASH, SWELLING OR PAIN  UNUSUAL VAGINAL DISCHARGE OR ITCHING   Items with * indicate a potential emergency and should be followed up as soon as possible or go to  the Emergency Department if any problems should occur.  Please show the CHEMOTHERAPY ALERT CARD or IMMUNOTHERAPY ALERT CARD at check-in to the Emergency Department and triage nurse.  Should you have questions after your visit or need to cancel or reschedule your appointment, please contact [facility] [place] - A DEPT OF [facility] [phone]  and follow the prompts.  Office hours are 8:00 a.m. to 4:30 p.m. Monday - Friday. Please note that voicemails left after 4:00 p.m. may not be returned until the following business day.  We are closed weekends and major holidays. You have access to a nurse at all times for urgent questions. Please call the main number to the clinic [phone] and follow the prompts.  For any non-urgent questions, you may also contact your provider using MyChart. We now offer e-Visits for anyone 2 and older to request care online for non-urgent symptoms. For details visit [email or web address].   Also download the MyChart app! Go to the app store, search "MyChart", open the app, select [place], and log in with your MyChart username and password.

## 2023-06-15 NOTE — Progress Notes (Signed)
 Renee Waters presented for Portacath access and flush. Proper placement of portacath confirmed by CXR. Portacath located right chest wall accessed with  H 20 needle. Good blood return present. Portacath flushed with 20ml NS and 500U/19ml Heparin  and needle removed intact. Procedure without incident. Patient tolerated procedure well.

## 2023-06-19 ENCOUNTER — Other Ambulatory Visit: Payer: Self-pay | Admitting: Hematology

## 2023-06-19 NOTE — Progress Notes (Addendum)
 History of Present Illness: This was a telehealth visit with the patient.  This was done by way of telephone conversation.  The patient was at home, physician was in a remote location from the office ([place] office closed due to utility problem within the city).  Renee Waters is a 78 y.o. year old female here for follow-up of incomplete bladder emptying as well as recurrent urinary tract infection/postmenopausal atrophic vaginitis.  She does not tolerate perivaginal estrogen cream.  At her visit in April 2024, she grew Enterococcus.  She was admitted to the hospital on 13 June.  Blood culture were positive but most likely skin contaminant.  She was hypertensive.  Urine culture was negative.  8.6.2024: Follow-up of recent UTI treatment.  She has not been performing double voiding.  At this visit, it was strongly recommended that she continue to do that.  She has been on trimethoprim  suppression.  11.12.2024: Here for routine check.  Does wait on the toilet a bit after initial void to help with voiding a second time.  She is not having leakage.  She has not been treated for urinary tract infections since her last visit here 3 months ago.  She is still on trimethoprim .  5.13.2025: TH visit today. No recent UTI. She feels as if she is doing a better job w/ bladder emptying.  Past Medical History:  Diagnosis Date   Acute cholangitis    Acute respiratory failure with hypoxia (HCC) 09/01/2021   Allergy    Anemia    Anxiety    Arthritis    Phreesia 03/18/2020   Barrett's esophagus    Cataract    Chronic back pain    Chronic neck pain    CKD (chronic kidney disease) stage 3, GFR 30-59 ml/min (HCC) 10/29/2020   Depression    Genital herpes    GERD (gastroesophageal reflux disease)    H/O degenerative disc disease    History of blood transfusion    Hypertension    Hypoxia 12/01/2021   Insomnia    Lupus (systemic lupus erythematosus) (HCC)    Neuromuscular disorder (HCC)     Osteoarthritis    S/P colonoscopy 07/2003   normal, no polyps   S/P endoscopy June 2005, Oct 2009   2005: short-segment Barrett's, 2009: short-segment Barrett's   Upper respiratory tract infection 07/09/2020   UTI (lower urinary tract infection) 11/2012    Past Surgical History:  Procedure Laterality Date   ABDOMINAL HYSTERECTOMY     BACK SURGERY     BIOPSY N/A 03/20/2014   Procedure: BIOPSY;  Surgeon: Suzette Espy, MD;  Location: [facility];  Service: Endoscopy;  Laterality: N/A;   BIOPSY  09/14/2015   Procedure: BIOPSY;  Surgeon: Suzette Espy, MD;  Location: [facility];  Service: Endoscopy;;  esophageal and gastric   BIOPSY  07/23/2019   Procedure: BIOPSY;  Surgeon: Suzette Espy, MD;  Location: [facility];  Service: Endoscopy;;  esophageal    CARPAL TUNNEL RELEASE Left 2013   cervical disectomy  2002   CESAREAN SECTION N/A    Phreesia 03/18/2020   CHOLECYSTECTOMY     with lysis of adhesions for sbo; "ruptured gallbladder".   COLONOSCOPY  11/09/2011   RMR: Melanosis coli   COLONOSCOPY WITH PROPOFOL  N/A 09/14/2015   Dr. Riley Cheadle: diverticulosis, 3mm TA removed. next TCS 09/2020.    COLONOSCOPY WITH PROPOFOL  N/A 04/30/2020   Procedure: COLONOSCOPY WITH PROPOFOL ;  Surgeon: Suzette Espy, MD;  Location: [facility];  Service:  Endoscopy;  Laterality: N/A;  PM (ASA 3)   DENTAL SURGERY  11/2015   multiple tooth extraction   ESOPHAGOGASTRODUODENOSCOPY  11/29/2007   salmon-colored  tongue   longest stable at  3 cm, distal esophagus as described previously status post biopsy/ Hiatal hernia, otherwise normal stomach D1 and D2   ESOPHAGOGASTRODUODENOSCOPY  01/06/11   short segment Barrett's esophagus s/p bx/Hiatal hernia   ESOPHAGOGASTRODUODENOSCOPY (EGD) WITH PROPOFOL  N/A 03/20/2014   YQM:VHQIONGE distal esophagus short segment barrett's, bx with no dysplasia. next egd in 03/2017   ESOPHAGOGASTRODUODENOSCOPY (EGD) WITH PROPOFOL  N/A 09/14/2015   Dr. Riley Cheadle: Barrett's without dysplasia,  gastritis benign bx, hiatal hernia. next EGD 09/2018.   ESOPHAGOGASTRODUODENOSCOPY (EGD) WITH PROPOFOL  N/A 07/23/2019   Procedure: ESOPHAGOGASTRODUODENOSCOPY (EGD) WITH PROPOFOL ;  Surgeon: Suzette Espy, MD;  Location: [facility];  Service: Endoscopy;  Laterality: N/A;  3:00pm   EYE SURGERY N/A    Phreesia 03/18/2020   HERNIA REPAIR Right 07/2010   Dr. Bessie Brook   IR IMAGING GUIDED PORT INSERTION  02/09/2021   JOINT REPLACEMENT     LAPAROSCOPIC CHOLECYSTECTOMY  2017   at [facility]   POLYPECTOMY  09/14/2015   Procedure: POLYPECTOMY;  Surgeon: Suzette Espy, MD;  Location: [facility];  Service: Endoscopy;;  ascending colon   right hip replacement  07/2010   went back in sept 2012 to fix   SHOULDER ARTHROSCOPY  2008   left   SPINE SURGERY N/A    Phreesia 03/18/2020   TOTAL HIP REVISION Right 12/17/2012   Procedure: RIGHT TOTAL HIP REVISION;  Surgeon: Bevin Bucks, MD;  Location: [facility];  Service: Orthopedics;  Laterality: Right;   WRIST SURGERY Right 2011   open reduction right wrist.    Home Medications:  (Not in a hospital admission)   Allergies:  Allergies  Allergen Reactions   Bee Venom Swelling and Hives   Norvasc  [Amlodipine ] Other (See Comments)    Hair loss   Tylenol  [Acetaminophen ] Itching   Hydralazine  Other (See Comments)    Hair loss   Red Dye #40 (Allura Red) Itching   Percocet [Oxycodone -Acetaminophen ] Rash and Other (See Comments)    Pt states, "this gives her a rash, but at home she takes oxycodone  for pain relief"    Tyloxapol Itching and Rash    Family History  Problem Relation Age of Onset   Hypertension Mother    Stroke Mother    Colon cancer Neg Hx    Anesthesia problems Neg Hx    Hypotension Neg Hx    Malignant hyperthermia Neg Hx    Pseudochol deficiency Neg Hx    Gastric cancer Neg Hx    Esophageal cancer Neg Hx     Social History:  reports that she quit smoking about 20 years ago. Her smoking use included cigarettes. She started smoking  about 45 years ago. She has a 6.3 pack-year smoking history. She has never used smokeless tobacco. She reports that she does not drink alcohol and does not use drugs.  ROS: A complete review of systems was performed.  All systems are negative except for pertinent findings as noted.   I have reviewed prior pt notes  I have reviewed prior urinalysis results   Impression/Assessment:  Incomplete bladder emptying-stable urinary residual volume when last checked  History of recurrent urinary tract infections--none recently  Plan: -continue TMP  -OV 3 mos to recheck pvr and urinalysis  -cont double voiding  I connected with  Audery Lean on 06/20/23 by  a video enabled telemedicine application and verified that I am speaking with the correct person using two identifiers.   I discussed the limitations of evaluation and management by telemedicine. The patient expressed understanding and agreed to proceed.

## 2023-06-20 ENCOUNTER — Telehealth: Payer: Medicare Other | Admitting: Urology

## 2023-06-20 DIAGNOSIS — N390 Urinary tract infection, site not specified: Secondary | ICD-10-CM

## 2023-06-20 DIAGNOSIS — N952 Postmenopausal atrophic vaginitis: Secondary | ICD-10-CM

## 2023-06-20 DIAGNOSIS — R339 Retention of urine, unspecified: Secondary | ICD-10-CM

## 2023-06-20 DIAGNOSIS — Z87440 Personal history of urinary (tract) infections: Secondary | ICD-10-CM

## 2023-06-21 ENCOUNTER — Other Ambulatory Visit: Payer: Self-pay

## 2023-06-21 DIAGNOSIS — N179 Acute kidney failure, unspecified: Secondary | ICD-10-CM | POA: Diagnosis not present

## 2023-06-21 DIAGNOSIS — F5105 Insomnia due to other mental disorder: Secondary | ICD-10-CM | POA: Diagnosis not present

## 2023-06-21 DIAGNOSIS — I700 Atherosclerosis of aorta: Secondary | ICD-10-CM | POA: Diagnosis not present

## 2023-06-21 DIAGNOSIS — N184 Chronic kidney disease, stage 4 (severe): Secondary | ICD-10-CM | POA: Diagnosis not present

## 2023-06-21 DIAGNOSIS — J439 Emphysema, unspecified: Secondary | ICD-10-CM | POA: Diagnosis not present

## 2023-06-21 DIAGNOSIS — D471 Chronic myeloproliferative disease: Secondary | ICD-10-CM | POA: Diagnosis not present

## 2023-06-23 ENCOUNTER — Other Ambulatory Visit: Payer: Self-pay | Admitting: Pharmacy Technician

## 2023-06-23 ENCOUNTER — Other Ambulatory Visit: Payer: Self-pay

## 2023-06-23 NOTE — Progress Notes (Signed)
 Specialty Pharmacy Refill Coordination Note  Renee Waters is a 78 y.o. female contacted today regarding refills of specialty medication(s) Momelotinib Dihydrochloride  (OJJAARA )   Patient requested Delivery   Delivery date: 06/28/23   Verified address: [address] [place] [address]   Medication will be filled on 06/27/23.

## 2023-06-25 ENCOUNTER — Other Ambulatory Visit: Payer: Self-pay | Admitting: Hematology

## 2023-06-26 ENCOUNTER — Other Ambulatory Visit: Payer: Self-pay | Admitting: Internal Medicine

## 2023-06-27 ENCOUNTER — Other Ambulatory Visit: Payer: Self-pay

## 2023-06-28 ENCOUNTER — Other Ambulatory Visit: Payer: Self-pay | Admitting: Family Medicine

## 2023-06-28 NOTE — Telephone Encounter (Unsigned)
 Copied from CRM [id number]. Topic: Clinical - Medication Refill >> Jun 28, 2023 10:38 AM Sophia H wrote: Medication: temazepam  (RESTORIL ) 30 MG capsule   Has the patient contacted their pharmacy? Yes (Agent: If no, request that the patient contact the pharmacy for the refill. If patient does not wish to contact the pharmacy document the reason why and proceed with request.) (Agent: If yes, when and what did the pharmacy advise?)  This is the patient's preferred pharmacy:  [facility] [address] [place] - [address] [place] [address] Phone: [phone] Fax: [phone]   Is this the correct pharmacy for this prescription? Yes If no, delete pharmacy and type the correct one.   Has the prescription been filled recently? Yes  Is the patient out of the medication? Yes  Has the patient been seen for an appointment in the last year OR does the patient have an upcoming appointment? Yes  Can we respond through MyChart? No, phone call preferred  Agent: Please be advised that Rx refills may take up to 3 business days. We ask that you follow-up with your pharmacy.

## 2023-06-29 ENCOUNTER — Telehealth: Payer: Self-pay | Admitting: Family Medicine

## 2023-06-29 DIAGNOSIS — D471 Chronic myeloproliferative disease: Secondary | ICD-10-CM | POA: Diagnosis not present

## 2023-06-29 DIAGNOSIS — N184 Chronic kidney disease, stage 4 (severe): Secondary | ICD-10-CM | POA: Diagnosis not present

## 2023-06-29 DIAGNOSIS — F5105 Insomnia due to other mental disorder: Secondary | ICD-10-CM | POA: Diagnosis not present

## 2023-06-29 DIAGNOSIS — J439 Emphysema, unspecified: Secondary | ICD-10-CM | POA: Diagnosis not present

## 2023-06-29 DIAGNOSIS — I700 Atherosclerosis of aorta: Secondary | ICD-10-CM | POA: Diagnosis not present

## 2023-06-29 DIAGNOSIS — N179 Acute kidney failure, unspecified: Secondary | ICD-10-CM | POA: Diagnosis not present

## 2023-06-29 MED ORDER — TEMAZEPAM 30 MG PO CAPS: 30.0000 mg | ORAL_CAPSULE | Freq: Every evening | ORAL | 5 refills | Status: AC | PRN

## 2023-06-29 NOTE — Telephone Encounter (Signed)
Requesting temazepam refill

## 2023-06-29 NOTE — Telephone Encounter (Signed)
 Restoril  refilled x 5 months

## 2023-06-29 NOTE — Telephone Encounter (Signed)
 Prescription Request  06/29/2023  LOV: 04/20/2023  What is the name of the medication or equipment? temazepam  (RESTORIL ) 30 MG capsule [[id number]]    Have you contacted your pharmacy to request a refill? Yes   Which pharmacy would you like this sent to?   [facility] [address] [place] - [address] [place] [address] Phone: [phone] Fax: [phone]      Patient notified that their request is being sent to the clinical staff for review and that they should receive a response within 2 business days.   Please advise at [facility] [phone]

## 2023-06-29 NOTE — Telephone Encounter (Unsigned)
 Copied from CRM [id number]. Topic: Clinical - Medication Refill >> Jun 29, 2023 10:28 AM Zipporah Him wrote: Medication: temazepam  (RESTORIL ) 30 MG capsule  Has the patient contacted their pharmacy? Yes States they have not received this refill  This is the patient's preferred pharmacy:  [facility] [address], [place] - [address] [place] [address] Phone: [phone] Fax: [phone]   Is this the correct pharmacy for this prescription? Yes If no, delete pharmacy and type the correct one.   Has the prescription been filled recently? No  Is the patient out of the medication? Yes  Has the patient been seen for an appointment in the last year OR does the patient have an upcoming appointment? Yes  Can we respond through MyChart? Yes  Agent: Please be advised that Rx refills may take up to 3 business days. We ask that you follow-up with your pharmacy.

## 2023-07-04 DIAGNOSIS — F339 Major depressive disorder, recurrent, unspecified: Secondary | ICD-10-CM | POA: Diagnosis not present

## 2023-07-04 DIAGNOSIS — F411 Generalized anxiety disorder: Secondary | ICD-10-CM | POA: Diagnosis not present

## 2023-07-11 ENCOUNTER — Encounter: Payer: Self-pay | Admitting: Internal Medicine

## 2023-07-13 ENCOUNTER — Ambulatory Visit (INDEPENDENT_AMBULATORY_CARE_PROVIDER_SITE_OTHER): Admitting: Family Medicine

## 2023-07-13 ENCOUNTER — Encounter: Payer: Self-pay | Admitting: Family Medicine

## 2023-07-13 VITALS — BP 136/76 | HR 58 | Resp 18 | Ht 65.0 in | Wt 125.0 lb

## 2023-07-13 DIAGNOSIS — F5105 Insomnia due to other mental disorder: Secondary | ICD-10-CM | POA: Diagnosis not present

## 2023-07-13 DIAGNOSIS — I5032 Chronic diastolic (congestive) heart failure: Secondary | ICD-10-CM | POA: Diagnosis not present

## 2023-07-13 DIAGNOSIS — Z1329 Encounter for screening for other suspected endocrine disorder: Secondary | ICD-10-CM | POA: Diagnosis not present

## 2023-07-13 DIAGNOSIS — E785 Hyperlipidemia, unspecified: Secondary | ICD-10-CM

## 2023-07-13 DIAGNOSIS — D471 Chronic myeloproliferative disease: Secondary | ICD-10-CM

## 2023-07-13 DIAGNOSIS — R52 Pain, unspecified: Secondary | ICD-10-CM

## 2023-07-13 DIAGNOSIS — E559 Vitamin D deficiency, unspecified: Secondary | ICD-10-CM

## 2023-07-13 DIAGNOSIS — I10 Essential (primary) hypertension: Secondary | ICD-10-CM | POA: Diagnosis not present

## 2023-07-13 DIAGNOSIS — F418 Other specified anxiety disorders: Secondary | ICD-10-CM | POA: Diagnosis not present

## 2023-07-13 DIAGNOSIS — R39198 Other difficulties with micturition: Secondary | ICD-10-CM

## 2023-07-13 MED ORDER — PREGABALIN 25 MG PO CAPS: 25.0000 mg | ORAL_CAPSULE | Freq: Three times a day (TID) | ORAL | 3 refills | Status: AC

## 2023-07-13 NOTE — Patient Instructions (Addendum)
 F/U in 3 months, call I f you need me sooner  New higher dose  lyrica  25 mg every 8 hours for pain, let me know if a problem I will reduce dose to twice daily if it is   Fasting lipid, TSH and free T4 and vit D the same day you have labs at the hospital   Keep doing what you are  doing!  Thanks for choosing [facility], we consider it a privelige to serve you.

## 2023-07-16 ENCOUNTER — Encounter: Payer: Self-pay | Admitting: Family Medicine

## 2023-07-16 DIAGNOSIS — E785 Hyperlipidemia, unspecified: Secondary | ICD-10-CM | POA: Insufficient documentation

## 2023-07-16 DIAGNOSIS — Z1329 Encounter for screening for other suspected endocrine disorder: Secondary | ICD-10-CM | POA: Insufficient documentation

## 2023-07-16 HISTORY — DX: Hyperlipidemia, unspecified: E78.5

## 2023-07-16 NOTE — Assessment & Plan Note (Signed)
 Hyperlipidemia:Low fat diet discussed and encouraged.   Lipid Panel  Lab Results  Component Value Date   CHOL 95 (L) 10/05/2020   HDL 27 (L) 10/05/2020   LDLCALC 51 10/05/2020   TRIG 87 10/05/2020   CHOLHDL 3.5 10/05/2020     Updated lab needed

## 2023-07-16 NOTE — Assessment & Plan Note (Signed)
 Updated lab needed at/ before next visit.

## 2023-07-16 NOTE — Assessment & Plan Note (Signed)
 Primarily in right hip and knee, inc lyrica  25 mg to 3 times daily

## 2023-07-16 NOTE — Assessment & Plan Note (Signed)
 Actively managed by Oncology with great response in past year

## 2023-07-16 NOTE — Progress Notes (Signed)
   Renee Waters     MRN: [id number]      DOB: 20-Apr-1945  Chief Complaint  Patient presents with   Hypertension    12 week follow up     HPI Renee Waters is here for follow up and re-evaluation of chronic medical conditions, medication management and review of any available recent lab and radiology data.  Preventive health is updated, specifically  Cancer screening and Immunization.   Questions or concerns regarding consultations or procedures which the PT has had in the interim are  addressed. The PT denies any adverse reactions to current medications since the last visit.  C/o uncontrolled right hip and knee pain,states does not think med is helping very much, open to inc dose of lyrica  and that is the direction that is taken, states out in her garden nore and she knows that tis is a part of the trigger but no interest and rightfully so, in stopping, she enjoys gardening Good appetite Great family support Excellent labs continue Declines mammogram   ROS Denies recent fever or chills. Denies sinus pressure, nasal congestion, ear pain or sore throat. Denies chest congestion, productive cough or wheezing. Denies chest pains, palpitations and leg swelling Denies abdominal pain, nausea, vomiting,diarrhea or constipation.   Denies dysuria, frequency, hesitancy or incontinence. Denies skin break down or rash.   PE  BP 136/76   Pulse (!) 58   Resp 18   Ht 5\' 5"  (1.651 m)   Wt 125 lb (56.7 kg)   SpO2 98%   BMI 20.80 kg/m   Patient alert and oriented and in no cardiopulmonary distress.  HEENT: No facial asymmetry, EOMI,     Neck supple .  Chest: Clear to auscultation bilaterally.  CVS: S1, S2 systolic murmur, no S3.Regular rate.  ABD: Soft non tender.   Ext: No edema  MS: decreased ROM spine, hips and knees.  Skin: Intact, no ulcerations or rash noted.  Psych: Good eye contact, normal affect. Memory intact not anxious or depressed appearing.  CNS: CN 2-12 intact,  power,  normal throughout.no focal deficits noted.   Assessment & Plan  Insomnia secondary to depression with anxiety Sleep hygiene reviewed , good response to current med, same refilled o. Prescription sent for  medication needed.   MPN (myeloproliferative neoplasm) (HCC) Actively managed by Oncology with great response in past year  Vitamin D  deficiency Updated lab needed at/ before next visit.   Chronic diastolic heart failure (HCC) No s/s of decompensation continue current regime  Difficulty voiding resolved  Generalized pain Primarily in right hip and knee, inc lyrica  25 mg to 3 times daily  Essential hypertension Hyperlipidemia:Low fat diet discussed and encouraged.   Lipid Panel  Lab Results  Component Value Date   CHOL 95 (L) 10/05/2020   HDL 27 (L) 10/05/2020   LDLCALC 51 10/05/2020   TRIG 87 10/05/2020   CHOLHDL 3.5 10/05/2020     Updated lab needed    Thyroid  disorder screen tSH and free t4 to be checked when next lab draw

## 2023-07-16 NOTE — Assessment & Plan Note (Signed)
 resolved

## 2023-07-16 NOTE — Assessment & Plan Note (Signed)
 tSH and free t4 to be checked when next lab draw

## 2023-07-16 NOTE — Assessment & Plan Note (Signed)
 No s/s of decompensation continue current regime

## 2023-07-16 NOTE — Assessment & Plan Note (Signed)
 Sleep hygiene reviewed , good response to current med, same refilled o. Prescription sent for  medication needed.

## 2023-07-17 ENCOUNTER — Ambulatory Visit (HOSPITAL_COMMUNITY)
Admission: RE | Admit: 2023-07-17 | Discharge: 2023-07-17 | Disposition: A | Discharge status: Home / Self Care | Source: Ambulatory Visit | Attending: Hematology | Admitting: Hematology

## 2023-07-17 ENCOUNTER — Other Ambulatory Visit: Payer: Self-pay

## 2023-07-17 DIAGNOSIS — Z0389 Encounter for observation for other suspected diseases and conditions ruled out: Secondary | ICD-10-CM | POA: Diagnosis not present

## 2023-07-17 DIAGNOSIS — R161 Splenomegaly, not elsewhere classified: Secondary | ICD-10-CM | POA: Insufficient documentation

## 2023-07-17 DIAGNOSIS — E559 Vitamin D deficiency, unspecified: Secondary | ICD-10-CM

## 2023-07-17 DIAGNOSIS — Z1329 Encounter for screening for other suspected endocrine disorder: Secondary | ICD-10-CM

## 2023-07-17 DIAGNOSIS — E785 Hyperlipidemia, unspecified: Secondary | ICD-10-CM

## 2023-07-17 DIAGNOSIS — I10 Essential (primary) hypertension: Secondary | ICD-10-CM

## 2023-07-18 DIAGNOSIS — Z515 Encounter for palliative care: Secondary | ICD-10-CM | POA: Diagnosis not present

## 2023-07-18 DIAGNOSIS — D47Z9 Other specified neoplasms of uncertain behavior of lymphoid, hematopoietic and related tissue: Secondary | ICD-10-CM | POA: Diagnosis not present

## 2023-07-19 DIAGNOSIS — F411 Generalized anxiety disorder: Secondary | ICD-10-CM | POA: Diagnosis not present

## 2023-07-19 DIAGNOSIS — F339 Major depressive disorder, recurrent, unspecified: Secondary | ICD-10-CM | POA: Diagnosis not present

## 2023-07-20 DIAGNOSIS — H35363 Drusen (degenerative) of macula, bilateral: Secondary | ICD-10-CM | POA: Diagnosis not present

## 2023-07-20 DIAGNOSIS — H401131 Primary open-angle glaucoma, bilateral, mild stage: Secondary | ICD-10-CM | POA: Diagnosis not present

## 2023-07-20 DIAGNOSIS — H35373 Puckering of macula, bilateral: Secondary | ICD-10-CM | POA: Diagnosis not present

## 2023-07-20 DIAGNOSIS — H532 Diplopia: Secondary | ICD-10-CM | POA: Diagnosis not present

## 2023-07-24 ENCOUNTER — Other Ambulatory Visit: Payer: Self-pay

## 2023-07-25 ENCOUNTER — Other Ambulatory Visit: Payer: Self-pay | Admitting: Hematology

## 2023-07-26 ENCOUNTER — Other Ambulatory Visit: Payer: Self-pay

## 2023-07-26 ENCOUNTER — Other Ambulatory Visit: Payer: Self-pay | Admitting: Pharmacy Technician

## 2023-07-26 NOTE — Progress Notes (Signed)
 Specialty Pharmacy Ongoing Clinical Assessment Note  Renee Waters is a 77 y.o. female who is being followed by the specialty pharmacy service for [facility] Oncology   Patient's specialty medication(s) reviewed today: Momelotinib Dihydrochloride  (OJJAARA )   Missed doses in the last 4 weeks: 0   Patient/Caregiver did not have any additional questions or concerns.   Therapeutic benefit summary: Patient is achieving benefit   Adverse events/side effects summary: No adverse events/side effects   Patient's therapy is appropriate to: Continue    Goals Addressed             This Visit's Progress    Stabilization of disease   On track    Patient is on track. Patient will maintain adherence. Per 05/04/23 visit notes, labs stable.          Follow up: 6 months  Isayah Ignasiak M Alexes [facility] Specialty Pharmacist

## 2023-07-26 NOTE — Progress Notes (Signed)
 Specialty Pharmacy Refill Coordination Note  Renee Waters is a 78 y.o. female contacted today regarding refills of specialty medication(s) Momelotinib Dihydrochloride  (OJJAARA )   Patient requested Delivery   Delivery date: 07/28/23   Verified address: [address]  [place]   Medication will be filled on 07/27/23.

## 2023-07-26 NOTE — Progress Notes (Signed)
 [facility] [address] [place] [address]    Clinic Day:  07/27/2023  Referring physician: Antonetta Rollene BRAVO, MD  Patient Care Team: Antonetta Rollene BRAVO, MD as PCP - General Mallipeddi, Diannah SQUIBB, MD as PCP - Cardiology (Cardiology) Shaaron Lamar HERO, MD (Gastroenterology) Margrette Taft BRAVO, MD as Consulting Physician (Orthopedic Surgery) Rogers Hai, MD as Medical Oncologist (Oncology) Celestia Joesph SQUIBB, RN as Oncology Nurse Navigator (Oncology) Ramonita Suzen CROME, RN as [facility], [facility] Ophthalmology (Ophthalmology)   ASSESSMENT & PLAN:   Assessment: 1. MPL positive prefibrotic/early primary myelofibrosis: - CBC on 11/06/2020 with white count 75.6, differential 59% neutrophils, 12% monocytes, 4% lymphocytes, 1 percentage of eosinophils and basophils, 6% band neutrophils, 12% myelocytes, 1% promyelocytes, 29% blasts. - Pathologist review of blood smear reported as leukoerythroblastic reaction. - 25 pound weight loss in the last 6 months, unintentional.  Decreased appetite.  Reports fatigue for the last few months.  Reports night sweats x3 in the last 1 month. - Bone marrow biopsy on 11/18/2020 consistent with granulocytic proliferation with differential including CMML versus myeloproliferative disorder. - BCR/ABL was negative. 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 - NGS myeloid panel shows mutations in ASXL1, MPL, TET2, EZH2 mutations. - Spleen ultrasound on 12/16/2020 shows mildly enlarged measuring 12.4 x 9.6 x 7 cm with volume of 435 cc. - PDGFR alpha, beta and FGFR 1 was negative. - She was evaluated by Dr. Perri at [facility].  Slides were reviewed at [facility] hematopathology.  They thought it was less likely CMML and more likely MPL positive myeloproliferative neoplasm.  Dr. Perri has recommended initiate Jakafi . - Given anemia, B symptoms, cytogenetics with MPL, ASLX1,  normal karyotype she is intermediate risk on GIPPS at high risk MIPSS70+ - Ruxolitinib  10 mg twice daily started on 02/16/2021.  Dose increased to 15 mg twice daily on 05/18/2021. - CTAP on 04/07/2021: Hepatomegaly and splenomegaly measuring 14.3 cm.  No other pathology.  No spleen infarcts. -Ruxolitinib  dose increased to 15 mg twice daily on 09/20/2021. - Ultrasound spleen (09/21/2021): 13.6 cm with volume 532 mL.  This is slightly improved from previous volume. - Bone marrow biopsy (09/09/2021): Material is limited but overall features consistent with myeloid neoplasm.  No increase in blasts.  Erythroid precursors appear decreased but megakaryocytes are variably evident with clusters and small forms.  Reticulin's shows variable increase in reticulin fibers including areas with prominent increase.  Chromosome analysis was normal. - Tapering doses of ruxolitinib  started on 12/24/2021, last dose on 01/23/2022 - Evaluated by Dr. Perri on 12/24/2021. - Momelotinib 100 mg daily started on 01/26/2022, dose increased to 150 mg daily on 07/19/2022 due to generalized itching.  Dose decreased to 100 mg daily on 08/09/2022.  2.  Social/family history: - She lives at home and is able to do all her ADLs and IADLs although she is getting tired lately.  She reports quitting smoking 6 months ago and smoked 1 pack/week for 43 years. - She believes that her mother had some kind of leukemia.  No other malignancies.    Plan: MPL positive myeloproliferative neoplasm: - She is tolerating momelotinib 100 mg daily reasonably well. - Physical exam: Spleen tip palpable on deep inspiration. - Reviewed labs from today: LFTs are normal.  Creatinine stable at 2.1.  CBC with white count 15.9 and platelet count normal.  Hemoglobin is down to 8.4. - Reviewed ultrasound spleen from 07/17/2023: Spleen volume is 284  mL, slightly increased from 240 mL previously. - She could not tolerate increased doses of momelotinib.  Will continue  momelotinib 100 mg daily.  2.  Decreased appetite: - She reports decreased appetite.  She lost 5 pounds in the last 3 months. - Will start her on Megace  400 mg twice daily.  3.  Normocytic anemia: - Anemia from CKD and functional iron deficiency. - Hemoglobin today is 8.4.  She complains of fatigue.  I have recommended starting on erythropoiesis stimulating agents.  Will also check ferritin and iron panel.  We talked about starting her on Aranesp  100 mcg every 2 weeks and titrate to hemoglobin between 10 and 11.  She is agreeable.  4.  Hypokalemia: - Potassium is 4.3.  Continue potassium supplements.   5.  Generalized itching: - She has generalized itching which comes and goes 1-2 times per day and lasts about couple of hours.  She takes Atarax  50 mg which helps.  Overall generalized itching has improved since momelotinib started.  However we could not increase the dose of momelotinib due to GI issues.    Orders Placed This Encounter  Procedures   Iron and TIBC ([facility] ONLY)   Ferritin      I,Helena R Teague,acting as a scribe for Alean Stands, MD.,have documented all relevant documentation on the behalf of Alean Stands, MD,as directed by  Alean Stands, MD while in the presence of Alean Stands, MD.  I, Alean Stands MD, have reviewed the above documentation for accuracy and completeness, and I agree with the above.     Alean Stands, MD   6/19/20255:23 PM  CHIEF COMPLAINT:   Diagnosis: MPL positive myeloproliferative neoplasm    Cancer Staging  No matching staging information was found for the patient.    Prior Therapy: Jakafi  10 mg twice daily   Current Therapy:  Momelotinib 100 mg daily    HISTORY OF PRESENT ILLNESS:   Oncology History   No history exists.     INTERVAL HISTORY:   Renee Waters is a 78 y.o. female presenting to clinic today for follow up of MPL positive myeloproliferative neoplasm. She  was last seen by me on 05/04/23.  Since her last visit, she underwent US  spleen on 07/17/23 with the spleen measuring 10.9 x 4.9 x 10.3 cm and a volume of 284.1 mL.   Today, she states that she is doing well overall. Her appetite level is at 60%. Her energy level is at 50%. She is accompanied by a family member.   PAST MEDICAL HISTORY:   Past Medical History: Past Medical History:  Diagnosis Date   Acute cholangitis    Acute respiratory failure with hypoxia (HCC) 09/01/2021   Allergy    Anemia    Anxiety    Arthritis    Phreesia 03/18/2020   Barrett's esophagus    Cataract    Chronic back pain    Chronic neck pain    CKD (chronic kidney disease) stage 3, GFR 30-59 ml/min (HCC) 10/29/2020   Depression    Dyslipidemia 07/16/2023   Genital herpes    GERD (gastroesophageal reflux disease)    H/O degenerative disc disease    History of blood transfusion    Hypertension    Hypoxia 12/01/2021   Insomnia    Lupus (systemic lupus erythematosus) (HCC)    Neuromuscular disorder (HCC)    Osteoarthritis    S/P colonoscopy 07/2003   normal, no polyps   S/P endoscopy June 2005, Oct 2009   2005: short-segment  Barrett's, 2009: short-segment Barrett's   Upper respiratory tract infection 07/09/2020   UTI (lower urinary tract infection) 11/2012    Surgical History: Past Surgical History:  Procedure Laterality Date   ABDOMINAL HYSTERECTOMY     BACK SURGERY     BIOPSY N/A 03/20/2014   Procedure: BIOPSY;  Surgeon: Lamar CHRISTELLA Hollingshead, MD;  Location: [facility];  Service: Endoscopy;  Laterality: N/A;   BIOPSY  09/14/2015   Procedure: BIOPSY;  Surgeon: Lamar CHRISTELLA Hollingshead, MD;  Location: [facility];  Service: Endoscopy;;  esophageal and gastric   BIOPSY  07/23/2019   Procedure: BIOPSY;  Surgeon: Hollingshead Lamar CHRISTELLA, MD;  Location: [facility];  Service: Endoscopy;;  esophageal    CARPAL TUNNEL RELEASE Left 2013   cervical disectomy  2002   CESAREAN SECTION N/A    Phreesia 03/18/2020   CHOLECYSTECTOMY      with lysis of adhesions for sbo; ruptured gallbladder.   COLONOSCOPY  11/09/2011   RMR: Melanosis coli   COLONOSCOPY WITH PROPOFOL  N/A 09/14/2015   Dr. Hollingshead: diverticulosis, 3mm TA removed. next TCS 09/2020.    COLONOSCOPY WITH PROPOFOL  N/A 04/30/2020   Procedure: COLONOSCOPY WITH PROPOFOL ;  Surgeon: Hollingshead Lamar CHRISTELLA, MD;  Location: [facility];  Service: Endoscopy;  Laterality: N/A;  PM (ASA 3)   DENTAL SURGERY  11/2015   multiple tooth extraction   ESOPHAGOGASTRODUODENOSCOPY  11/29/2007   salmon-colored  tongue   longest stable at  3 cm, distal esophagus as described previously status post biopsy/ Hiatal hernia, otherwise normal stomach D1 and D2   ESOPHAGOGASTRODUODENOSCOPY  01/06/11   short segment Barrett's esophagus s/p bx/Hiatal hernia   ESOPHAGOGASTRODUODENOSCOPY (EGD) WITH PROPOFOL  N/A 03/20/2014   MFM:jawnmfjo distal esophagus short segment barrett's, bx with no dysplasia. next egd in 03/2017   ESOPHAGOGASTRODUODENOSCOPY (EGD) WITH PROPOFOL  N/A 09/14/2015   Dr. Hollingshead: Barrett's without dysplasia, gastritis benign bx, hiatal hernia. next EGD 09/2018.   ESOPHAGOGASTRODUODENOSCOPY (EGD) WITH PROPOFOL  N/A 07/23/2019   Procedure: ESOPHAGOGASTRODUODENOSCOPY (EGD) WITH PROPOFOL ;  Surgeon: Hollingshead Lamar CHRISTELLA, MD;  Location: [facility];  Service: Endoscopy;  Laterality: N/A;  3:00pm   EYE SURGERY N/A    Phreesia 03/18/2020   HERNIA REPAIR Right 07/2010   Dr. Blase   IR IMAGING GUIDED PORT INSERTION  02/09/2021   JOINT REPLACEMENT     LAPAROSCOPIC CHOLECYSTECTOMY  2017   at [facility]   POLYPECTOMY  09/14/2015   Procedure: POLYPECTOMY;  Surgeon: Lamar CHRISTELLA Hollingshead, MD;  Location: [facility];  Service: Endoscopy;;  ascending colon   right hip replacement  07/2010   went back in sept 2012 to fix   SHOULDER ARTHROSCOPY  2008   left   SPINE SURGERY N/A    Phreesia 03/18/2020   TOTAL HIP REVISION Right 12/17/2012   Procedure: RIGHT TOTAL HIP REVISION;  Surgeon: Donnice JONETTA Car, MD;  Location: [facility];   Service: Orthopedics;  Laterality: Right;   WRIST SURGERY Right 2011   open reduction right wrist.    Social History: Social History   Socioeconomic History   Marital status: Married    Spouse name: louis   Number of children: 4   Years of education: 12+   Highest education level: Some college, no degree  Occupational History   Occupation: Audiological scientist  - retired   Occupation: non profit  Tobacco Use   Smoking status: Former    Current packs/day: 0.00    Average packs/day: 0.3 packs/day for 25.0 years (6.3 ttl pk-yrs)  Types: Cigarettes    Start date: 02/06/1978    Quit date: 02/07/2003    Years since quitting: 20.4   Smokeless tobacco: Never   Tobacco comments:    quit in 2004  Vaping Use   Vaping status: Never Used  Substance and Sexual Activity   Alcohol use: No   Drug use: No   Sexual activity: Yes    Birth control/protection: Surgical  Other Topics Concern   Not on file  Social History Narrative   Not on file   Social Drivers of Health   Financial Resource Strain: High Risk (02/06/2023)   Overall Financial Resource Strain (CARDIA)    Difficulty of Paying Living Expenses: Hard  Food Insecurity: No Food Insecurity (02/06/2023)   Hunger Vital Sign    Worried About Running Out of Food in the Last Year: Never true    Ran Out of Food in the Last Year: Never true  Transportation Needs: No Transportation Needs (02/06/2023)   PRAPARE - Administrator, Civil Service (Medical): No    Lack of Transportation (Non-Medical): No  Physical Activity: Patient Declined (02/06/2023)   Exercise Vital Sign    Days of Exercise per Week: Patient declined    Minutes of Exercise per Session: Patient declined  Stress: No Stress Concern Present (02/06/2023)   [employer] of Occupational Health - Occupational Stress Questionnaire    Feeling of Stress : Not at all  Social Connections: Moderately Isolated (02/06/2023)   Social Connection and Isolation Panel     Frequency of Communication with Friends and Family: More than three times a week    Frequency of Social Gatherings with Friends and Family: Once a week    Attends Religious Services: Never    Database administrator or Organizations: No    Attends Banker Meetings: Never    Marital Status: Married  Catering manager Violence: Not At Risk (02/06/2023)   Humiliation, Afraid, Rape, and Kick questionnaire    Fear of Current or Ex-Partner: No    Emotionally Abused: No    Physically Abused: No    Sexually Abused: No    Family History: Family History  Problem Relation Age of Onset   Hypertension Mother    Stroke Mother    Colon cancer Neg Hx    Anesthesia problems Neg Hx    Hypotension Neg Hx    Malignant hyperthermia Neg Hx    Pseudochol deficiency Neg Hx    Gastric cancer Neg Hx    Esophageal cancer Neg Hx     Current Medications:  Current Outpatient Medications:    acyclovir  (ZOVIRAX ) 800 MG tablet, TAKE 1 TABLET BY MOUTH FIVE TIMES DAILY AS NEEDED, Disp: 30 tablet, Rfl: 0   allopurinol  (ZYLOPRIM ) 100 MG tablet, Take 2 tablets by mouth once daily, Disp: 60 tablet, Rfl: 0   EPINEPHrine  0.3 mg/0.3 mL IJ SOAJ injection, INJECT 0.3 MLS INTO MUSCLE ONCE AS NEEDED, Disp: 1 each, Rfl: 2   fentaNYL  (DURAGESIC ) 25 MCG/HR, Place 1 patch onto the skin every 3 (three) days., Disp: , Rfl:    hydrOXYzine  (ATARAX ) 50 MG tablet, Take 1 tablet by mouth three times daily as needed, Disp: 90 tablet, Rfl: 0   lidocaine -prilocaine  (EMLA ) cream, APPLY TOPICALLY AS NEEDED TO ACCESS PORT, Disp: 30 g, Rfl: 5   megestrol  (MEGACE ) 400 MG/10ML suspension, Take 10 mLs (400 mg total) by mouth 2 (two) times daily., Disp: 480 mL, Rfl: 2   metoprolol  tartrate (LOPRESSOR ) 25 MG tablet,  Take half tablet by mouthtwo times daily, Disp: 90 tablet, Rfl: 1   mirtazapine  (REMERON ) 15 MG tablet, TAKE 1 TABLET BY MOUTH AT BEDTIME, Disp: 30 tablet, Rfl: 5   momelotinib dihydrochloride  (OJJAARA ) 100 MG tablet,  Take 1 tablet (100 mg total) by mouth daily., Disp: 30 tablet, Rfl: 3   MOVANTIK  25 MG TABS tablet, Take 25 mg by mouth daily., Disp: , Rfl:    Multiple Vitamin (MULTIVITAMIN WITH MINERALS) TABS tablet, Take 1 tablet by mouth daily., Disp: 130 tablet, Rfl: 1   oxyCODONE  (OXY IR/ROXICODONE ) 5 MG immediate release tablet, Take 5 mg by mouth every 4 (four) hours as needed., Disp: , Rfl:    oxyCODONE  (ROXICODONE ) 15 MG immediate release tablet, Take 15 mg by mouth every 6 (six) hours as needed., Disp: , Rfl:    pantoprazole  (PROTONIX ) 40 MG tablet, Take 1 tablet (40 mg total) by mouth 2 (two) times daily before a meal., Disp: 60 tablet, Rfl: 11   potassium chloride  (KLOR-CON ) 10 MEQ tablet, Take 1 tablet by mouth once daily, Disp: 30 tablet, Rfl: 0   pregabalin  (LYRICA ) 25 MG capsule, Take 1 capsule (25 mg total) by mouth 3 (three) times daily., Disp: 90 capsule, Rfl: 3   prochlorperazine  (COMPAZINE ) 10 MG tablet, TAKE 1 TABLET BY MOUTH EVERY 6 HOURS AS NEEDED FOR NAUSEA OR VOMITING, Disp: 60 tablet, Rfl: 0   temazepam  (RESTORIL ) 30 MG capsule, Take 1 capsule (30 mg total) by mouth at bedtime as needed for sleep., Disp: 30 capsule, Rfl: 5   triamcinolone  ointment (KENALOG ) 0.5 %, Apply topically daily., Disp: 30 g, Rfl: 0   trimethoprim  (TRIMPEX ) 100 MG tablet, Take 1 tablet (100 mg total) by mouth daily., Disp: 30 tablet, Rfl: 11 No current facility-administered medications for this visit.  Facility-Administered Medications Ordered in Other Visits:    lanreotide acetate  (SOMATULINE DEPOT ) 120 MG/0.5ML injection, , , ,    lanreotide acetate  (SOMATULINE DEPOT ) 120 MG/0.5ML injection, , , ,    lanreotide acetate  (SOMATULINE DEPOT ) 120 MG/0.5ML injection, , , ,    octreotide  (SANDOSTATIN  LAR) 30 MG IM injection, , , ,    octreotide  (SANDOSTATIN  LAR) 30 MG IM injection, , , ,    Allergies: Allergies  Allergen Reactions   Bee Venom Swelling and Hives   Acetaminophen  Itching    Other Reaction(s):  itching, rash   Norvasc  [Amlodipine ] Other (See Comments)    Hair loss   Hydralazine  Other (See Comments)    Hair loss   Red Dye #40 (Allura Red) Itching   Percocet [Oxycodone -Acetaminophen ] Rash and Other (See Comments)    Pt states, this gives her a rash, but at home she takes oxycodone  for pain relief    Tyloxapol Itching and Rash    REVIEW OF SYSTEMS:   Review of Systems  Constitutional:  Negative for chills, fatigue and fever.  HENT:   Negative for lump/mass, mouth sores, nosebleeds, sore throat and trouble swallowing.   Eyes:  Negative for eye problems.  Respiratory:  Negative for cough and shortness of breath.   Cardiovascular:  Negative for chest pain, leg swelling and palpitations.  Gastrointestinal:  Negative for abdominal pain, constipation, diarrhea, nausea and vomiting.  Genitourinary:  Negative for bladder incontinence, difficulty urinating, dysuria, frequency, hematuria and nocturia.   Musculoskeletal:  Positive for arthralgias (Right hip). Negative for back pain, flank pain, myalgias and neck pain.  Skin:  Negative for itching and rash.  Neurological:  Negative for dizziness, headaches and numbness.  Hematological:  Does not bruise/bleed easily.  Psychiatric/Behavioral:  Positive for sleep disturbance. Negative for depression and suicidal ideas. The patient is not nervous/anxious.   All other systems reviewed and are negative.    VITALS:   Weight 123 lb (55.8 kg).  Wt Readings from Last 3 Encounters:  07/27/23 123 lb (55.8 kg)  07/13/23 125 lb (56.7 kg)  05/04/23 128 lb (58.1 kg)    Body mass index is 20.47 kg/m.  Performance status (ECOG): 1 - Symptomatic but completely ambulatory  PHYSICAL EXAM:   Physical Exam Vitals and nursing note reviewed. Exam conducted with a chaperone present.  Constitutional:      Appearance: Normal appearance.   Cardiovascular:     Rate and Rhythm: Normal rate and regular rhythm.     Pulses: Normal pulses.     Heart  sounds: Normal heart sounds.  Pulmonary:     Effort: Pulmonary effort is normal.     Breath sounds: Normal breath sounds.  Abdominal:     Palpations: Abdomen is soft. There is no hepatomegaly, splenomegaly or mass.     Tenderness: There is no abdominal tenderness.   Musculoskeletal:     Right lower leg: No edema.     Left lower leg: No edema.  Lymphadenopathy:     Cervical: No cervical adenopathy.     Right cervical: No superficial, deep or posterior cervical adenopathy.    Left cervical: No superficial, deep or posterior cervical adenopathy.     Upper Body:     Right upper body: No supraclavicular or axillary adenopathy.     Left upper body: No supraclavicular or axillary adenopathy.   Neurological:     General: No focal deficit present.     Mental Status: She is alert and oriented to person, place, and time.   Psychiatric:        Mood and Affect: Mood normal.        Behavior: Behavior normal.     LABS:      Latest Ref Rng & Units 07/27/2023    1:11 PM 06/15/2023   11:30 AM 05/04/2023   10:34 AM  CBC  WBC 4.0 - 10.5 K/uL 15.9  13.7  16.5   Hemoglobin 12.0 - 15.0 g/dL 8.4  9.4  8.9   Hematocrit 36.0 - 46.0 % 27.1  29.1  28.6   Platelets 150 - 400 K/uL 241  227  245       Latest Ref Rng & Units 07/27/2023    1:11 PM 05/04/2023   10:34 AM 04/06/2023   11:50 AM  CMP  Glucose 70 - 99 mg/dL 98  887  873   BUN 8 - 23 mg/dL 28  28  33   Creatinine 0.44 - 1.00 mg/dL 7.87  8.21  8.13   Sodium 135 - 145 mmol/L 135  137  137   Potassium 3.5 - 5.1 mmol/L 4.3  3.7  3.9   Chloride 98 - 111 mmol/L 104  105  107   CO2 22 - 32 mmol/L 23  22  22    Calcium  8.9 - 10.3 mg/dL 9.1  8.8  8.7   Total Protein 6.5 - 8.1 g/dL 8.2  8.0  7.6   Total Bilirubin 0.0 - 1.2 mg/dL 0.6  0.4  0.5   Alkaline Phos 38 - 126 U/L 94  106  112   AST 15 - 41 U/L 26  23  22    ALT 0 - 44 U/L 13  12  16  No results found for: CEA1, CEA / No results found for: CEA1, CEA No results found for:  PSA1 No results found for: CAN199 No results found for: CAN125  No results found for: STEPHANY RINGS, A1GS, A2GS, BETS, BETA2SER, GAMS, MSPIKE, SPEI Lab Results  Component Value Date   TIBC 329 08/25/2015   TIBC 336 02/21/2011   FERRITIN 26 08/25/2015   FERRITIN 81 02/21/2011   IRONPCTSAT 8 (L) 08/25/2015   IRONPCTSAT 23 02/21/2011   Lab Results  Component Value Date   LDH 457 (H) 07/27/2023   LDH 260 (H) 01/26/2023   LDH 588 (H) 07/19/2022     STUDIES:   US  SPLEEN (ABDOMEN LIMITED) Result Date: 07/21/2023 CLINICAL DATA:  Evaluate splenomegaly EXAM: ULTRASOUND ABDOMEN LIMITED COMPARISON:  Ultrasound 10/11/2022 FINDINGS: The spleen measures 10.9 x 4.9 x 10.3 cm with a volume of 284.1 mL. This is slightly increased from 10/11/2022 when it measured 10.2 cm in length with a volume of 240 mL. IMPRESSION: Spleen is at the upper limits of normal in size (284 mL), slightly increased from prior when it measured 240 mL. Electronically Signed   By: Norman Gatlin M.D.   On: 07/21/2023 03:49

## 2023-07-27 ENCOUNTER — Inpatient Hospital Stay: Attending: Hematology | Admitting: Hematology

## 2023-07-27 ENCOUNTER — Inpatient Hospital Stay

## 2023-07-27 ENCOUNTER — Other Ambulatory Visit (HOSPITAL_COMMUNITY): Payer: Self-pay

## 2023-07-27 VITALS — Wt 123.0 lb

## 2023-07-27 VITALS — BP 129/71 | HR 59 | Temp 96.8°F | Resp 20

## 2023-07-27 DIAGNOSIS — D471 Chronic myeloproliferative disease: Secondary | ICD-10-CM | POA: Insufficient documentation

## 2023-07-27 DIAGNOSIS — N1832 Chronic kidney disease, stage 3b: Secondary | ICD-10-CM

## 2023-07-27 DIAGNOSIS — D631 Anemia in chronic kidney disease: Secondary | ICD-10-CM | POA: Diagnosis present

## 2023-07-27 DIAGNOSIS — Z95828 Presence of other vascular implants and grafts: Secondary | ICD-10-CM

## 2023-07-27 DIAGNOSIS — D649 Anemia, unspecified: Secondary | ICD-10-CM

## 2023-07-27 DIAGNOSIS — D72829 Elevated white blood cell count, unspecified: Secondary | ICD-10-CM

## 2023-07-27 DIAGNOSIS — N184 Chronic kidney disease, stage 4 (severe): Secondary | ICD-10-CM | POA: Insufficient documentation

## 2023-07-27 LAB — IRON AND TIBC
Iron: 83 ug/dL (ref 28–170)
Saturation Ratios: 33 % — ABNORMAL HIGH (ref 10.4–31.8)
TIBC: 254 ug/dL (ref 250–450)
UIBC: 171 ug/dL

## 2023-07-27 LAB — CBC WITH DIFFERENTIAL/PLATELET
Abs Immature Granulocytes: 1.9 10*3/uL — ABNORMAL HIGH (ref 0.00–0.07)
Basophils Absolute: 0 10*3/uL (ref 0.0–0.1)
Basophils Relative: 0 %
Eosinophils Absolute: 0.2 10*3/uL (ref 0.0–0.5)
Eosinophils Relative: 1 %
HCT: 27.1 % — ABNORMAL LOW (ref 36.0–46.0)
Hemoglobin: 8.4 g/dL — ABNORMAL LOW (ref 12.0–15.0)
Lymphocytes Relative: 17 %
Lymphs Abs: 2.7 10*3/uL (ref 0.7–4.0)
MCH: 28.6 pg (ref 26.0–34.0)
MCHC: 31 g/dL (ref 30.0–36.0)
MCV: 92.2 fL (ref 80.0–100.0)
Metamyelocytes Relative: 6 %
Monocytes Absolute: 1.3 10*3/uL — ABNORMAL HIGH (ref 0.1–1.0)
Monocytes Relative: 8 %
Myelocytes: 6 %
Neutro Abs: 9.9 10*3/uL — ABNORMAL HIGH (ref 1.7–7.7)
Neutrophils Relative %: 62 %
Platelets: 241 10*3/uL (ref 150–400)
RBC: 2.94 MIL/uL — ABNORMAL LOW (ref 3.87–5.11)
RDW: 19.8 % — ABNORMAL HIGH (ref 11.5–15.5)
WBC: 15.9 10*3/uL — ABNORMAL HIGH (ref 4.0–10.5)
nRBC: 3.1 % — ABNORMAL HIGH (ref 0.0–0.2)

## 2023-07-27 LAB — COMPREHENSIVE METABOLIC PANEL WITH GFR
ALT: 13 U/L (ref 0–44)
AST: 26 U/L (ref 15–41)
Albumin: 3.7 g/dL (ref 3.5–5.0)
Alkaline Phosphatase: 94 U/L (ref 38–126)
Anion gap: 8 (ref 5–15)
BUN: 28 mg/dL — ABNORMAL HIGH (ref 8–23)
CO2: 23 mmol/L (ref 22–32)
Calcium: 9.1 mg/dL (ref 8.9–10.3)
Chloride: 104 mmol/L (ref 98–111)
Creatinine, Ser: 2.12 mg/dL — ABNORMAL HIGH (ref 0.44–1.00)
GFR, Estimated: 24 mL/min — ABNORMAL LOW (ref >60)
Glucose, Bld: 98 mg/dL (ref 70–99)
Potassium: 4.3 mmol/L (ref 3.5–5.1)
Sodium: 135 mmol/L (ref 135–145)
Total Bilirubin: 0.6 mg/dL (ref 0.0–1.2)
Total Protein: 8.2 g/dL — ABNORMAL HIGH (ref 6.5–8.1)

## 2023-07-27 LAB — LACTATE DEHYDROGENASE: LDH: 457 U/L — ABNORMAL HIGH (ref 98–192)

## 2023-07-27 LAB — FERRITIN: Ferritin: 267 ng/mL (ref 11–307)

## 2023-07-27 LAB — SAMPLE TO BLOOD BANK

## 2023-07-27 MED ORDER — SODIUM CHLORIDE 0.9% FLUSH
10.0000 mL | Freq: Once | INTRAVENOUS | Status: AC
  Administered 2023-07-27: 10 mL via INTRAVENOUS

## 2023-07-27 MED ORDER — MEGESTROL ACETATE 400 MG/10ML PO SUSP: 400.0000 mg | Freq: Two times a day (BID) | ORAL | 2 refills | Status: AC

## 2023-07-27 MED ORDER — HEPARIN SOD (PORK) LOCK FLUSH 100 UNIT/ML IV SOLN
500.0000 [IU] | Freq: Once | INTRAVENOUS | Status: AC
  Administered 2023-07-27: 500 [IU] via INTRAVENOUS

## 2023-07-27 MED ORDER — DARBEPOETIN ALFA 100 MCG/0.5ML IJ SOSY
100.0000 ug | PREFILLED_SYRINGE | Freq: Once | INTRAMUSCULAR | Status: AC
  Administered 2023-07-27: 100 ug via SUBCUTANEOUS
  Filled 2023-07-27: qty 0.5

## 2023-07-27 NOTE — Progress Notes (Signed)
 Renee Waters presents today for injection per the provider's orders.  Aranesp 100 mcg administration without incident; injection site WNL; see MAR for injection details.  Patient tolerated procedure well and without incident.  No questions or complaints noted at this time.   Discharged from clinic ambulatory with cane in stable condition. Alert and oriented x 3. F/U with [facility] as scheduled.

## 2023-07-27 NOTE — Patient Instructions (Signed)
 [facility] [place] - A DEPT OF [facility]  Discharge Instructions: Thank you for choosing [facility] to provide your oncology and hematology care.  If you have a lab appointment with the Cancer Center - please note that after April 8th, 2024, all labs will be drawn in the cancer center.  You do not have to check in or register with the main entrance as you have in the past but will complete your check-in in the cancer center.  Wear comfortable clothing and clothing appropriate for easy access to any Portacath or PICC line.   We strive to give you quality time with your provider. You may need to reschedule your appointment if you arrive late (15 or more minutes).  Arriving late affects you and other patients whose appointments are after yours.  Also, if you miss three or more appointments without notifying the office, you may be dismissed from the clinic at the provider's discretion.      For prescription refill requests, have your pharmacy contact our office and allow 72 hours for refills to be completed.    Today you received Aranesp injection.     BELOW ARE SYMPTOMS THAT SHOULD BE REPORTED IMMEDIATELY: *FEVER GREATER THAN 100.4 F (38 C) OR HIGHER *CHILLS OR SWEATING *NAUSEA AND VOMITING THAT IS NOT CONTROLLED WITH YOUR NAUSEA MEDICATION *UNUSUAL SHORTNESS OF BREATH *UNUSUAL BRUISING OR BLEEDING *URINARY PROBLEMS (pain or burning when urinating, or frequent urination) *BOWEL PROBLEMS (unusual diarrhea, constipation, pain near the anus) TENDERNESS IN MOUTH AND THROAT WITH OR WITHOUT PRESENCE OF ULCERS (sore throat, sores in mouth, or a toothache) UNUSUAL RASH, SWELLING OR PAIN  UNUSUAL VAGINAL DISCHARGE OR ITCHING   Items with * indicate a potential emergency and should be followed up as soon as possible or go to the Emergency Department if any problems should occur.  Please show the CHEMOTHERAPY ALERT CARD or IMMUNOTHERAPY ALERT CARD at check-in  to the Emergency Department and triage nurse.  Should you have questions after your visit or need to cancel or reschedule your appointment, please contact [facility] [place] - A DEPT OF [facility] [phone]  and follow the prompts.  Office hours are 8:00 a.m. to 4:30 p.m. Monday - Friday. Please note that voicemails left after 4:00 p.m. may not be returned until the following business day.  We are closed weekends and major holidays. You have access to a nurse at all times for urgent questions. Please call the main number to the clinic [phone] and follow the prompts.  For any non-urgent questions, you may also contact your provider using MyChart. We now offer e-Visits for anyone 74 and older to request care online for non-urgent symptoms. For details visit [email or web address].   Also download the MyChart app! Go to the app store, search MyChart, open the app, select [facility], and log in with your MyChart username and password.

## 2023-07-27 NOTE — Patient Instructions (Addendum)
 [facility] at [facility] Discharge Instructions   You were seen and examined today by Dr. Cheree Cords.  He reviewed the results of your lab work which are normal/stable.   He reviewed the results of your spleen ultrasound. It is showing your spleen is slightly enlarged.  He discussed with you starting you on a shot called Aranesp to help improve your hemoglobin.   We will see you back in 2 weeks. We will recheck lab work at that time.   Return as scheduled.    Thank you for choosing [facility] at [facility] to provide your oncology and hematology care.  To afford each patient quality time with our provider, please arrive at least 15 minutes before your scheduled appointment time.   If you have a lab appointment with the Cancer Center please come in thru the Main Entrance and check in at the main information desk.  You need to re-schedule your appointment should you arrive 10 or more minutes late.  We strive to give you quality time with our providers, and arriving late affects you and other patients whose appointments are after yours.  Also, if you no show three or more times for appointments you may be dismissed from the clinic at the providers discretion.     Again, thank you for choosing [facility].  Our hope is that these requests will decrease the amount of time that you wait before being seen by our physicians.       _____________________________________________________________  Should you have questions after your visit to [facility], please contact our office at [phone] and follow the prompts.  Our office hours are 8:00 a.m. and 4:30 p.m. Monday - Friday.  Please note that voicemails left after 4:00 p.m. may not be returned until the following business day.  We are closed weekends and major holidays.  You do have access to a nurse 24-7, just call the main number to the clinic [phone] and do not  press any options, hold on the line and a nurse will answer the phone.    For prescription refill requests, have your pharmacy contact our office and allow 72 hours.    Due to Covid, you will need to wear a mask upon entering the hospital. If you do not have a mask, a mask will be given to you at the Main Entrance upon arrival. For doctor visits, patients may have 1 support person age 16 or older with them. For treatment visits, patients can not have anyone with them due to social distancing guidelines and our immunocompromised population.

## 2023-07-27 NOTE — Progress Notes (Signed)
Patient is taking Ojjaara as prescribed.  She has not missed any doses and reports no side effects at this time.   

## 2023-07-28 ENCOUNTER — Other Ambulatory Visit: Payer: Self-pay | Admitting: Family Medicine

## 2023-07-29 LAB — ERYTHROPOIETIN: Erythropoietin: 32.8 m[IU]/mL — ABNORMAL HIGH (ref 2.6–18.5)

## 2023-07-31 ENCOUNTER — Other Ambulatory Visit: Payer: Self-pay | Admitting: Hematology

## 2023-08-10 ENCOUNTER — Inpatient Hospital Stay

## 2023-08-10 ENCOUNTER — Inpatient Hospital Stay: Attending: Hematology

## 2023-08-10 VITALS — BP 143/76 | HR 62 | Temp 97.9°F | Resp 18

## 2023-08-10 DIAGNOSIS — D471 Chronic myeloproliferative disease: Secondary | ICD-10-CM | POA: Diagnosis not present

## 2023-08-10 DIAGNOSIS — D649 Anemia, unspecified: Secondary | ICD-10-CM

## 2023-08-10 DIAGNOSIS — N184 Chronic kidney disease, stage 4 (severe): Secondary | ICD-10-CM | POA: Diagnosis present

## 2023-08-10 DIAGNOSIS — N1832 Chronic kidney disease, stage 3b: Secondary | ICD-10-CM

## 2023-08-10 DIAGNOSIS — E876 Hypokalemia: Secondary | ICD-10-CM | POA: Insufficient documentation

## 2023-08-10 DIAGNOSIS — D631 Anemia in chronic kidney disease: Secondary | ICD-10-CM | POA: Insufficient documentation

## 2023-08-10 DIAGNOSIS — Z87891 Personal history of nicotine dependence: Secondary | ICD-10-CM | POA: Diagnosis not present

## 2023-08-10 DIAGNOSIS — L299 Pruritus, unspecified: Secondary | ICD-10-CM | POA: Insufficient documentation

## 2023-08-10 LAB — COMPREHENSIVE METABOLIC PANEL WITH GFR
ALT: 12 U/L (ref 0–44)
AST: 21 U/L (ref 15–41)
Albumin: 3.7 g/dL (ref 3.5–5.0)
Alkaline Phosphatase: 75 U/L (ref 38–126)
Anion gap: 9 (ref 5–15)
BUN: 29 mg/dL — ABNORMAL HIGH (ref 8–23)
CO2: 18 mmol/L — ABNORMAL LOW (ref 22–32)
Calcium: 8.3 mg/dL — ABNORMAL LOW (ref 8.9–10.3)
Chloride: 111 mmol/L (ref 98–111)
Creatinine, Ser: 1.86 mg/dL — ABNORMAL HIGH (ref 0.44–1.00)
GFR, Estimated: 28 mL/min — ABNORMAL LOW (ref >60)
Glucose, Bld: 118 mg/dL — ABNORMAL HIGH (ref 70–99)
Potassium: 4.1 mmol/L (ref 3.5–5.1)
Sodium: 138 mmol/L (ref 135–145)
Total Bilirubin: 0.7 mg/dL (ref 0.0–1.2)
Total Protein: 7.9 g/dL (ref 6.5–8.1)

## 2023-08-10 LAB — SAMPLE TO BLOOD BANK

## 2023-08-10 LAB — CBC
HCT: 26.7 % — ABNORMAL LOW (ref 36.0–46.0)
Hemoglobin: 8.6 g/dL — ABNORMAL LOW (ref 12.0–15.0)
MCH: 30.4 pg (ref 26.0–34.0)
MCHC: 32.2 g/dL (ref 30.0–36.0)
MCV: 94.3 fL (ref 80.0–100.0)
Platelets: 280 10*3/uL (ref 150–400)
RBC: 2.83 MIL/uL — ABNORMAL LOW (ref 3.87–5.11)
RDW: 21.3 % — ABNORMAL HIGH (ref 11.5–15.5)
WBC: 21.1 10*3/uL — ABNORMAL HIGH (ref 4.0–10.5)
nRBC: 3.2 % — ABNORMAL HIGH (ref 0.0–0.2)

## 2023-08-10 MED ORDER — HEPARIN SOD (PORK) LOCK FLUSH 100 UNIT/ML IV SOLN
500.0000 [IU] | Freq: Once | INTRAVENOUS | Status: AC
  Administered 2023-08-10: 500 [IU] via INTRAVENOUS

## 2023-08-10 MED ORDER — SODIUM CHLORIDE 0.9% FLUSH
10.0000 mL | Freq: Once | INTRAVENOUS | Status: AC
  Administered 2023-08-10: 10 mL via INTRAVENOUS

## 2023-08-10 MED ORDER — DARBEPOETIN ALFA 100 MCG/0.5ML IJ SOSY
100.0000 ug | PREFILLED_SYRINGE | Freq: Once | INTRAMUSCULAR | Status: AC
  Administered 2023-08-10: 100 ug via SUBCUTANEOUS
  Filled 2023-08-10: qty 0.5

## 2023-08-10 NOTE — Patient Instructions (Signed)
 [facility] [place] - A DEPT OF [facility]  Discharge Instructions: Thank you for choosing [facility] to provide your oncology and hematology care.  If you have a lab appointment with the Cancer Center - please note that after April 8th, 2024, all labs will be drawn in the cancer center.  You do not have to check in or register with the main entrance as you have in the past but will complete your check-in in the cancer center.  Wear comfortable clothing and clothing appropriate for easy access to any Portacath or PICC line.   We strive to give you quality time with your provider. You may need to reschedule your appointment if you arrive late (15 or more minutes).  Arriving late affects you and other patients whose appointments are after yours.  Also, if you miss three or more appointments without notifying the office, you may be dismissed from the clinic at the provider's discretion.      For prescription refill requests, have your pharmacy contact our office and allow 72 hours for refills to be completed.    Today you received Aranesp  100 mg injection.     BELOW ARE SYMPTOMS THAT SHOULD BE REPORTED IMMEDIATELY: *FEVER GREATER THAN 100.4 F (38 C) OR HIGHER *CHILLS OR SWEATING *NAUSEA AND VOMITING THAT IS NOT CONTROLLED WITH YOUR NAUSEA MEDICATION *UNUSUAL SHORTNESS OF BREATH *UNUSUAL BRUISING OR BLEEDING *URINARY PROBLEMS (pain or burning when urinating, or frequent urination) *BOWEL PROBLEMS (unusual diarrhea, constipation, pain near the anus) TENDERNESS IN MOUTH AND THROAT WITH OR WITHOUT PRESENCE OF ULCERS (sore throat, sores in mouth, or a toothache) UNUSUAL RASH, SWELLING OR PAIN  UNUSUAL VAGINAL DISCHARGE OR ITCHING   Items with * indicate a potential emergency and should be followed up as soon as possible or go to the Emergency Department if any problems should occur.  Please show the CHEMOTHERAPY ALERT CARD or IMMUNOTHERAPY ALERT CARD at  check-in to the Emergency Department and triage nurse.  Should you have questions after your visit or need to cancel or reschedule your appointment, please contact [facility] [place] - A DEPT OF [facility] [phone]  and follow the prompts.  Office hours are 8:00 a.m. to 4:30 p.m. Monday - Friday. Please note that voicemails left after 4:00 p.m. may not be returned until the following business day.  We are closed weekends and major holidays. You have access to a nurse at all times for urgent questions. Please call the main number to the clinic [phone] and follow the prompts.  For any non-urgent questions, you may also contact your provider using MyChart. We now offer e-Visits for anyone 39 and older to request care online for non-urgent symptoms. For details visit [email or web address].   Also download the MyChart app! Go to the app store, search MyChart, open the app, select [place], and log in with your MyChart username and password.

## 2023-08-10 NOTE — Progress Notes (Signed)
 Patients port flushed without difficulty.  Good blood return noted with no bruising or swelling noted at site. Labs drawn per orders. Band aid applied.  VSS with discharge and left in satisfactory condition with no s/s of distress noted. All follow ups as scheduled.       Renee Waters [employer]

## 2023-08-10 NOTE — Progress Notes (Signed)
 Renee Waters presents today for injection per the provider's orders. Aranesp  100 mcg  administration without incident; injection site WNL; see MAR for injection details.  Patient tolerated procedure well and without incident.  No questions or complaints noted at this time. Patient's hemoglobin noted to be 8.6 today.  Discharged from clinic ambulatory with cane in stable condition. Alert and oriented x 3. F/U with [facility] as scheduled.

## 2023-08-15 ENCOUNTER — Encounter: Payer: Self-pay | Admitting: Internal Medicine

## 2023-08-15 ENCOUNTER — Ambulatory Visit: Admitting: Internal Medicine

## 2023-08-15 VITALS — BP 127/57 | HR 67 | Temp 98.8°F | Ht 65.0 in | Wt 127.4 lb

## 2023-08-15 DIAGNOSIS — D126 Benign neoplasm of colon, unspecified: Secondary | ICD-10-CM

## 2023-08-15 DIAGNOSIS — T402X5D Adverse effect of other opioids, subsequent encounter: Secondary | ICD-10-CM | POA: Diagnosis not present

## 2023-08-15 DIAGNOSIS — K219 Gastro-esophageal reflux disease without esophagitis: Secondary | ICD-10-CM | POA: Diagnosis not present

## 2023-08-15 DIAGNOSIS — K5903 Drug induced constipation: Secondary | ICD-10-CM | POA: Diagnosis not present

## 2023-08-15 DIAGNOSIS — K2270 Barrett's esophagus without dysplasia: Secondary | ICD-10-CM

## 2023-08-15 MED ORDER — SYMPROIC 0.2 MG PO TABS: 0.2000 mg | ORAL_TABLET | Freq: Every day | ORAL | 11 refills | Status: AC

## 2023-08-15 NOTE — Progress Notes (Unsigned)
 Primary Care Physician:  Antonetta Rollene BRAVO, MD Primary Gastroenterologist:  Dr.   Pre-Procedure History & Physical: HPI:  Renee Waters is a 78 y.o. female here for follow-up GERD Short segment Barrett's constipation history of colonic adenoma.  She is not enrolled in further surveillance of Barrett's or colonic adenoma given her comorbidities.  Reflux well-controlled on Protonix  40 mg twice daily.  No dysphagia.  Constipation not doing so well now on Movantik  she has nausea and vomiting with Movantik  does not like taking it.  Megace  added to her regiment by oncology.  She has gained 2-1/2 pounds by her scale since last visit.  She continues taking Metamucil for bowel function on top of Movantik .  Past Medical History:  Diagnosis Date   Acute cholangitis    Acute respiratory failure with hypoxia (HCC) 09/01/2021   Allergy    Anemia    Anxiety    Arthritis    Phreesia 03/18/2020   Barrett's esophagus    Cataract    Chronic back pain    Chronic neck pain    CKD (chronic kidney disease) stage 3, GFR 30-59 ml/min (HCC) 10/29/2020   Depression    Dyslipidemia 07/16/2023   Genital herpes    GERD (gastroesophageal reflux disease)    H/O degenerative disc disease    History of blood transfusion    Hypertension    Hypoxia 12/01/2021   Insomnia    Lupus (systemic lupus erythematosus) (HCC)    Neuromuscular disorder (HCC)    Osteoarthritis    S/P colonoscopy 07/2003   normal, no polyps   S/P endoscopy June 2005, Oct 2009   2005: short-segment Barrett's, 2009: short-segment Barrett's   Upper respiratory tract infection 07/09/2020   UTI (lower urinary tract infection) 11/2012    Past Surgical History:  Procedure Laterality Date   ABDOMINAL HYSTERECTOMY     BACK SURGERY     BIOPSY N/A 03/20/2014   Procedure: BIOPSY;  Surgeon: Lamar CHRISTELLA Hollingshead, MD;  Location: [facility];  Service: Endoscopy;  Laterality: N/A;   BIOPSY  09/14/2015   Procedure: BIOPSY;  Surgeon: Lamar CHRISTELLA Hollingshead, MD;   Location: [facility];  Service: Endoscopy;;  esophageal and gastric   BIOPSY  07/23/2019   Procedure: BIOPSY;  Surgeon: Hollingshead Lamar CHRISTELLA, MD;  Location: [facility];  Service: Endoscopy;;  esophageal    CARPAL TUNNEL RELEASE Left 2013   cervical disectomy  2002   CESAREAN SECTION N/A    Phreesia 03/18/2020   CHOLECYSTECTOMY     with lysis of adhesions for sbo; ruptured gallbladder.   COLONOSCOPY  11/09/2011   RMR: Melanosis coli   COLONOSCOPY WITH PROPOFOL  N/A 09/14/2015   Dr. Hollingshead: diverticulosis, 3mm TA removed. next TCS 09/2020.    COLONOSCOPY WITH PROPOFOL  N/A 04/30/2020   Procedure: COLONOSCOPY WITH PROPOFOL ;  Surgeon: Hollingshead Lamar CHRISTELLA, MD;  Location: [facility];  Service: Endoscopy;  Laterality: N/A;  PM (ASA 3)   DENTAL SURGERY  11/2015   multiple tooth extraction   ESOPHAGOGASTRODUODENOSCOPY  11/29/2007   salmon-colored  tongue   longest stable at  3 cm, distal esophagus as described previously status post biopsy/ Hiatal hernia, otherwise normal stomach D1 and D2   ESOPHAGOGASTRODUODENOSCOPY  01/06/11   short segment Barrett's esophagus s/p bx/Hiatal hernia   ESOPHAGOGASTRODUODENOSCOPY (EGD) WITH PROPOFOL  N/A 03/20/2014   MFM:[id number] distal esophagus short segment barrett's, bx with no dysplasia. next egd in 03/2017   ESOPHAGOGASTRODUODENOSCOPY (EGD) WITH PROPOFOL  N/A 09/14/2015   Dr. Hollingshead: Barrett's  without dysplasia, gastritis benign bx, hiatal hernia. next EGD 09/2018.   ESOPHAGOGASTRODUODENOSCOPY (EGD) WITH PROPOFOL  N/A 07/23/2019   Procedure: ESOPHAGOGASTRODUODENOSCOPY (EGD) WITH PROPOFOL ;  Surgeon: Shaaron Lamar HERO, MD;  Location[facility];  Service: Endoscopy;  Laterality: N/A;  3:00pm   EYE SURGERY N/A    Phreesia 03/18/2020   HERNIA REPAIR Right 07/2010   Dr. Blase   IR IMAGING GUIDED PORT INSERTION  02/09/2021   JOINT REPLACEMENT     LAPAROSCOPIC CHOLECYSTECTOMY  2017   at [facility]   POLYPECTOMY  09/14/2015   Procedure: POLYPECTOMY;  Surgeon: Lamar HERO Shaaron, MD;   Location: [facility];  Service: Endoscopy;;  ascending colon   right hip replacement  07/2010   went back in sept 2012 to fix   SHOULDER ARTHROSCOPY  2008   left   SPINE SURGERY N/A    Phreesia 03/18/2020   TOTAL HIP REVISION Right 12/17/2012   Procedure: RIGHT TOTAL HIP REVISION;  Surgeon: Donnice JONETTA Car, MD;  Location: [facility];  Service: Orthopedics;  Laterality: Right;   WRIST SURGERY Right 2011   open reduction right wrist.    Prior to Admission medications   Medication Sig Start Date End Date Taking? Authorizing Provider  acyclovir  (ZOVIRAX ) 800 MG tablet TAKE 1 TABLET BY MOUTH FIVE TIMES DAILY AS NEEDED 05/17/23  Yes Antonetta Rollene BRAVO, MD  allopurinol  (ZYLOPRIM ) 100 MG tablet Take 2 tablets by mouth once daily 07/25/23  Yes Rogers Hai, MD  EPINEPHrine  0.3 mg/0.3 mL IJ SOAJ injection INJECT 0.3 MLS INTO MUSCLE ONCE AS NEEDED 01/11/22  Yes Golda Lynwood PARAS, MD  hydrOXYzine  (ATARAX ) 50 MG tablet Take 1 tablet by mouth three times daily as needed 07/31/23  Yes Rogers Hai, MD  lidocaine -prilocaine  (EMLA ) cream APPLY TOPICALLY AS NEEDED TO ACCESS PORT 04/21/23  Yes Rogers Hai, MD  megestrol  (MEGACE ) 400 MG/10ML suspension Take 10 mLs (400 mg total) by mouth 2 (two) times daily. 07/27/23  Yes Rogers Hai, MD  metoprolol  tartrate (LOPRESSOR ) 25 MG tablet Take half tablet by mouthtwo times daily 04/20/23  Yes Antonetta Rollene BRAVO, MD  mirtazapine  (REMERON ) 15 MG tablet TAKE 1 TABLET BY MOUTH AT BEDTIME 03/23/23  Yes Antonetta Rollene BRAVO, MD  momelotinib dihydrochloride  (OJJAARA ) 100 MG tablet Take 1 tablet (100 mg total) by mouth daily. 04/26/23  Yes Rogers Hai, MD  MOVANTIK  25 MG TABS tablet Take 25 mg by mouth daily. 04/29/23  Yes [provider]  Multiple Vitamin (MULTIVITAMIN WITH MINERALS) TABS tablet Take 1 tablet by mouth daily. 10/25/22  Yes Antonetta Rollene BRAVO, MD  oxyCODONE  (ROXICODONE ) 15 MG immediate release tablet Take 15 mg by mouth  every 6 (six) hours as needed. 01/19/22  Yes [provider]  pantoprazole  (PROTONIX ) 40 MG tablet Take 1 tablet (40 mg total) by mouth 2 (two) times daily before a meal. 11/14/22  Yes Tressie Ragin, Lamar HERO, MD  potassium chloride  (KLOR-CON ) 10 MEQ tablet Take 1 tablet by mouth once daily 05/23/23  Yes Katragadda, Sreedhar, MD  pregabalin  (LYRICA ) 25 MG capsule Take 1 capsule (25 mg total) by mouth 3 (three) times daily. 07/13/23  Yes Antonetta Rollene BRAVO, MD  prochlorperazine  (COMPAZINE ) 10 MG tablet TAKE 1 TABLET BY MOUTH EVERY 6 HOURS AS NEEDED FOR NAUSEA OR VOMITING 11/16/22  Yes Rogers Hai, MD  temazepam  (RESTORIL ) 30 MG capsule Take 1 capsule (30 mg total) by mouth at bedtime as needed for sleep. 06/29/23  Yes Antonetta Rollene BRAVO, MD  triamcinolone  ointment (KENALOG ) 0.5 % Apply  topically daily. 02/27/23  Yes Antonetta Rollene BRAVO, MD  trimethoprim  (TRIMPEX ) 100 MG tablet Take 1 tablet (100 mg total) by mouth daily. 08/30/22  Yes Gerldine Lauraine BROCKS, FNP    Allergies as of 08/15/2023 - Review Complete 08/15/2023  Allergen Reaction Noted   Bee venom Swelling and Hives 06/12/2012   Acetaminophen  Itching 09/16/2019   Norvasc  [amlodipine ] Other (See Comments) 09/16/2019   Hydralazine  Other (See Comments) 01/07/2022   Red dye #40 (allura red) Itching 10/19/2021   Percocet [oxycodone -acetaminophen ] Rash and Other (See Comments) 01/26/2015   Tyloxapol Itching and Rash 12/10/2018    Family History  Problem Relation Age of Onset   Hypertension Mother    Stroke Mother    Colon cancer Neg Hx    Anesthesia problems Neg Hx    Hypotension Neg Hx    Malignant hyperthermia Neg Hx    Pseudochol deficiency Neg Hx    Gastric cancer Neg Hx    Esophageal cancer Neg Hx     Social History   Socioeconomic History   Marital status: Married    Spouse name: louis   Number of children: 4   Years of education: 12+   Highest education level: Some college, no degree  Occupational History    Occupation: Audiological scientist  - retired   Occupation: non profit  Tobacco Use   Smoking status: Former    Current packs/day: 0.00    Average packs/day: 0.3 packs/day for 25.0 years (6.3 ttl pk-yrs)    Types: Cigarettes    Start date: 02/06/1978    Quit date: 02/07/2003    Years since quitting: 20.5   Smokeless tobacco: Never   Tobacco comments:    quit in 2004  Vaping Use   Vaping status: Never Used  Substance and Sexual Activity   Alcohol use: No   Drug use: No   Sexual activity: Yes    Birth control/protection: Surgical  Other Topics Concern   Not on file  Social History Narrative   Not on file   Social Drivers of Health   Financial Resource Strain: High Risk (02/06/2023)   Overall Financial Resource Strain (CARDIA)    Difficulty of Paying Living Expenses: Hard  Food Insecurity: No Food Insecurity (02/06/2023)   Hunger Vital Sign    Worried About Running Out of Food in the Last Year: Never true    Ran Out of Food in the Last Year: Never true  Transportation Needs: No Transportation Needs (02/06/2023)   PRAPARE - Administrator, Civil Service (Medical): No    Lack of Transportation (Non-Medical): No  Physical Activity: Patient Declined (02/06/2023)   Exercise Vital Sign    Days of Exercise per Week: Patient declined    Minutes of Exercise per Session: Patient declined  Stress: No Stress Concern Present (02/06/2023)   [employer] of Occupational Health - Occupational Stress Questionnaire    Feeling of Stress : Not at all  Social Connections: Moderately Isolated (02/06/2023)   Social Connection and Isolation Panel    Frequency of Communication with Friends and Family: More than three times a week    Frequency of Social Gatherings with Friends and Family: Once a week    Attends Religious Services: Never    Database administrator or Organizations: No    Attends Banker Meetings: Never    Marital Status: Married  Catering manager Violence: Not  At Risk (02/06/2023)   Humiliation, Afraid, Rape, and Kick questionnaire    Fear of Current  or Ex-Partner: No    Emotionally Abused: No    Physically Abused: No    Sexually Abused: No    Review of Systems: See HPI, otherwise negative ROS  Physical Exam: BP (!) 127/57 (BP Location: Left Arm, Patient Position: Sitting, Cuff Size: Normal)   Pulse 67   Temp 98.8 F (37.1 C) (Oral)   Ht 5' 5 (1.651 m)   Wt 127 lb 6.4 oz (57.8 kg)   SpO2 99%   BMI 21.20 kg/m  General:   Alert,   pleasant and cooperative in NAD Heart:  Regular rate and rhythm; no murmurs, clicks, rubs,  or gallops. Abdomen: Non-distended, normal bowel sounds.  Soft and nontender without appreciable mass or hepatosplenomegaly.   Impression/Plan: 78 year old lady with myelodysplastic neoplasm.  Opioid induced constipation.  GERD.  She is nauseated with Movantik .  Need to do better with management of her constipation.  It is good to see she has gained 2 and half pound since her last visit.  She is no longer enrolled in Barrett's or adenoma surveillance.  Recommendations:  Symproic  0.2 mg tablet -1 daily.  Dispense 30 with 11 refills  Continue taking pantoprazole  40 mg twice daily.  Plan to see you back in the office in 3 months   Notice: This dictation was prepared with Dragon dictation along with smaller phrase technology. Any transcriptional errors that result from this process are unintentional and may not be corrected upon review.

## 2023-08-15 NOTE — Patient Instructions (Signed)
 It was good to see you again today!  As discussed, stop Movantik   Will start a new medication for constipation that is specially developed for people to take opioids: Symproic  0.2 mg tablet -1 daily.  Dispense 30 with 11 refills  Continue taking pantoprazole  40 mg twice daily.  As discussed, no need for a future upper endoscopy or colonoscopy  Call me in 3 weeks and let me know how Symproic  is working for you.  Plan to see you back in the office in 3 months

## 2023-08-19 IMAGING — US US EXTREM  UP VENOUS*R*
1 series · 13 of 24 positions shown · non-contrast
Comparison: None Available.

CLINICAL DATA: Right upper arm pain and swelling.



[Series 1: us venous img upper uni right (dvt) · portal-venous · 52 acquisitions, 13 frames shown]
[im 1/52]
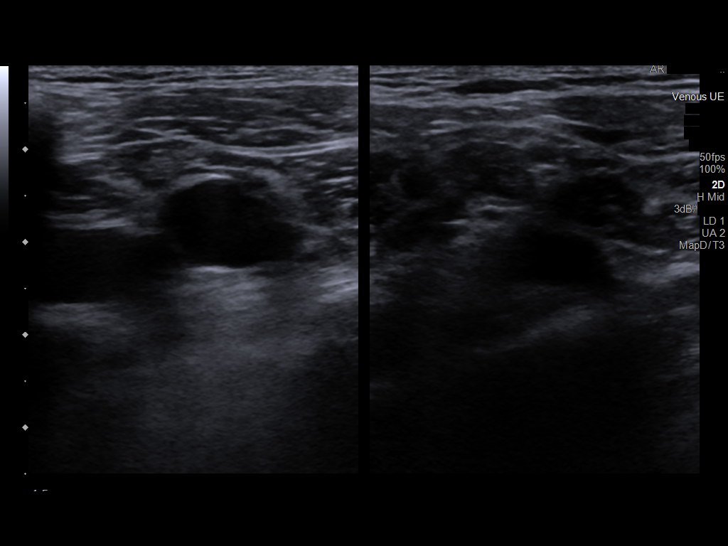
[im 5/52]
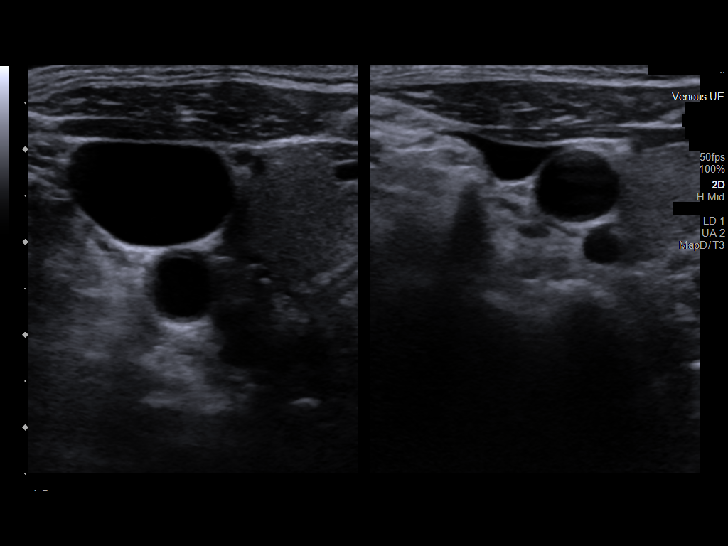
[im 9/52]
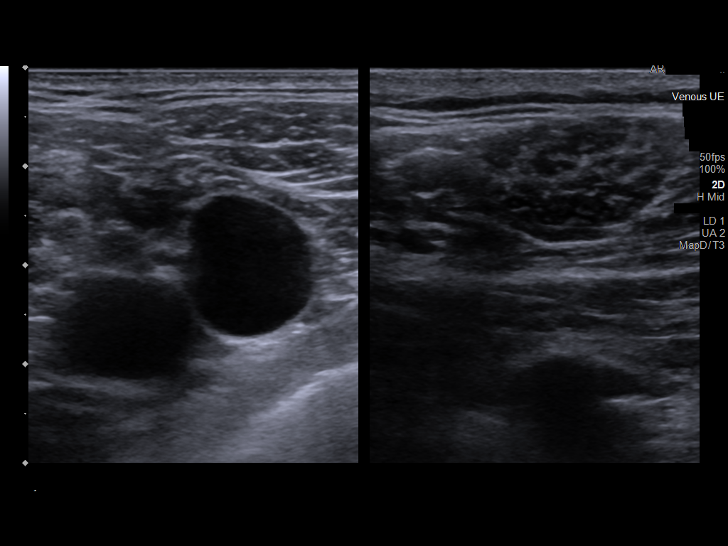
[im 14/52]
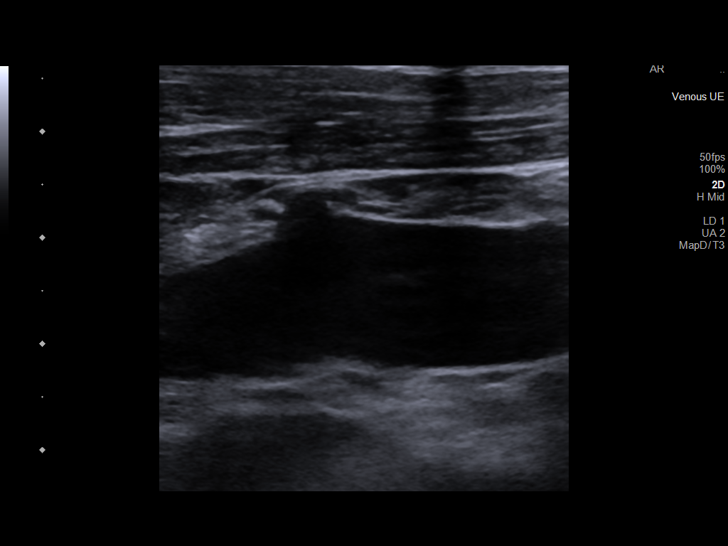
[im 18/52]
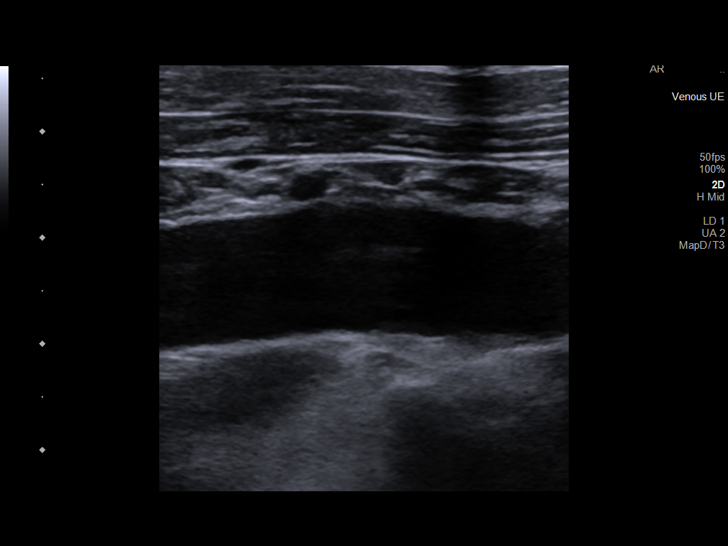
[im 23/52]
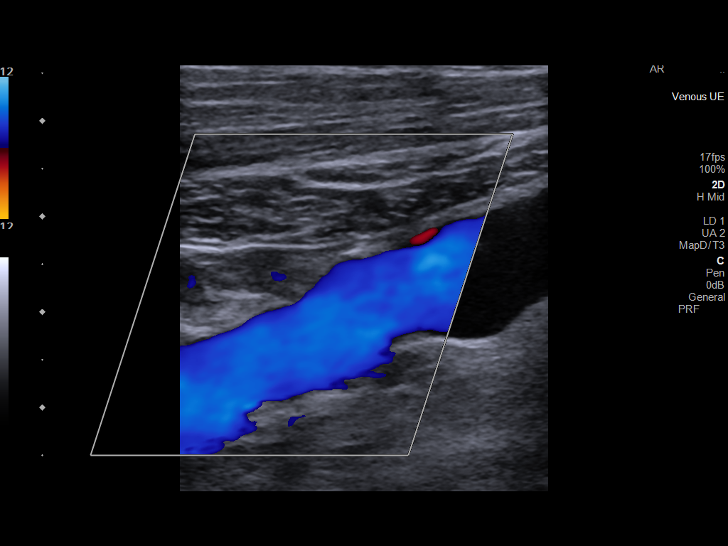
[im 29/52]
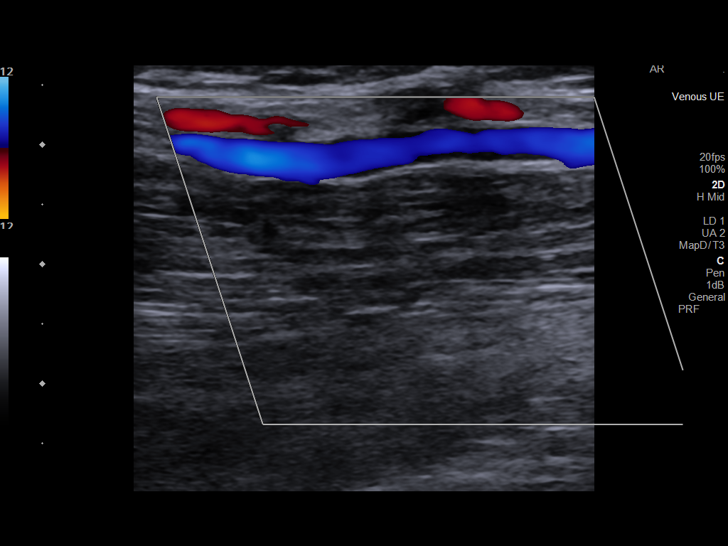
[im 32/52]
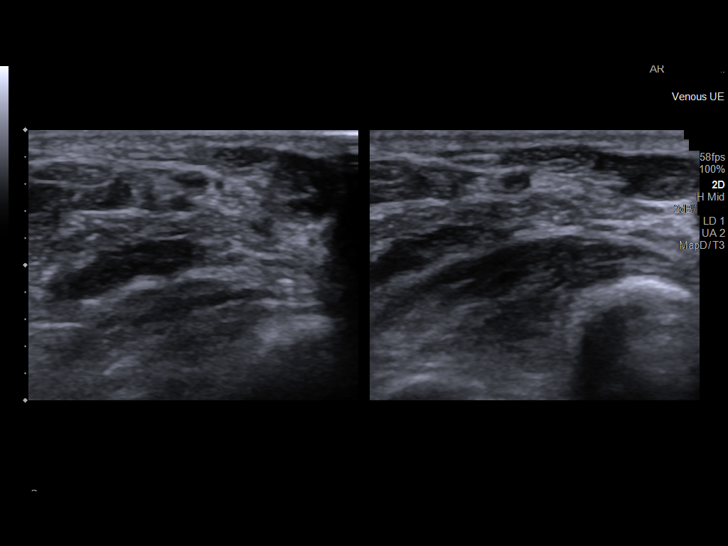
[im 36/52]
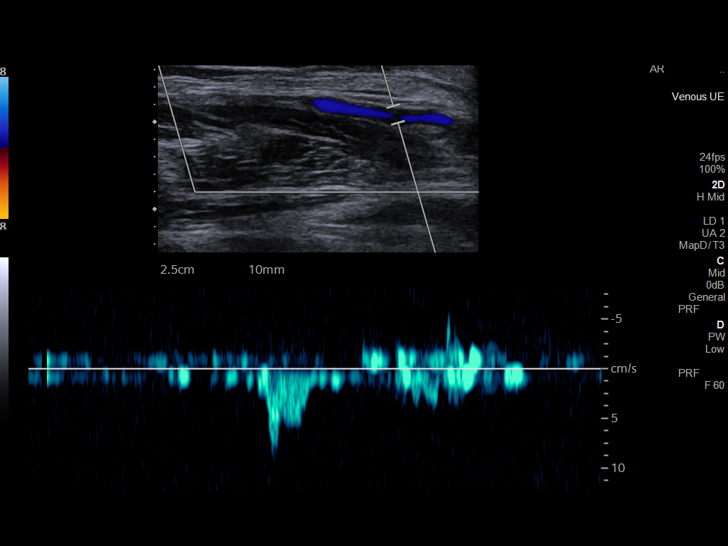
[im 40/52]
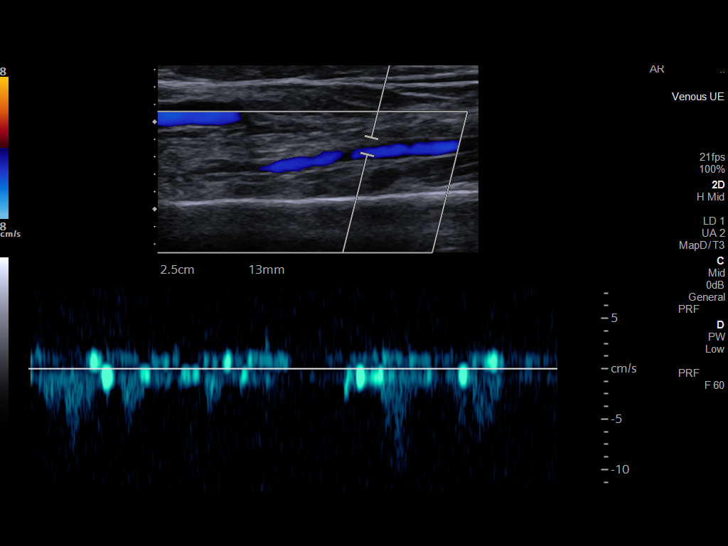
[im 45/52]
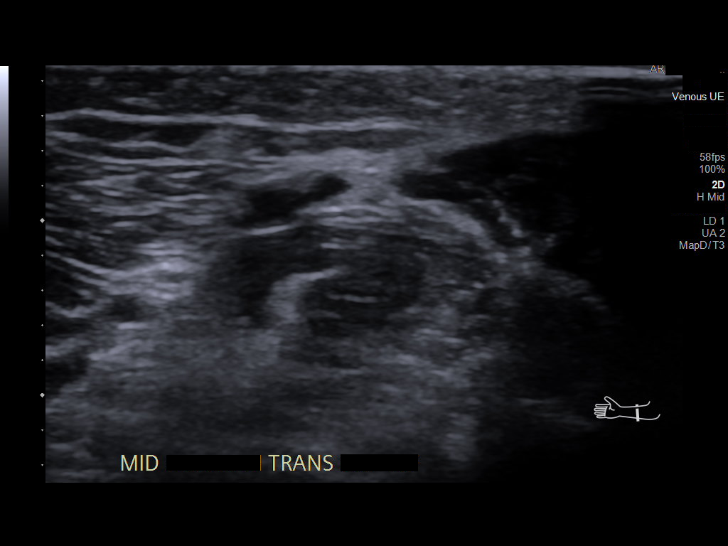
[im 47/52]
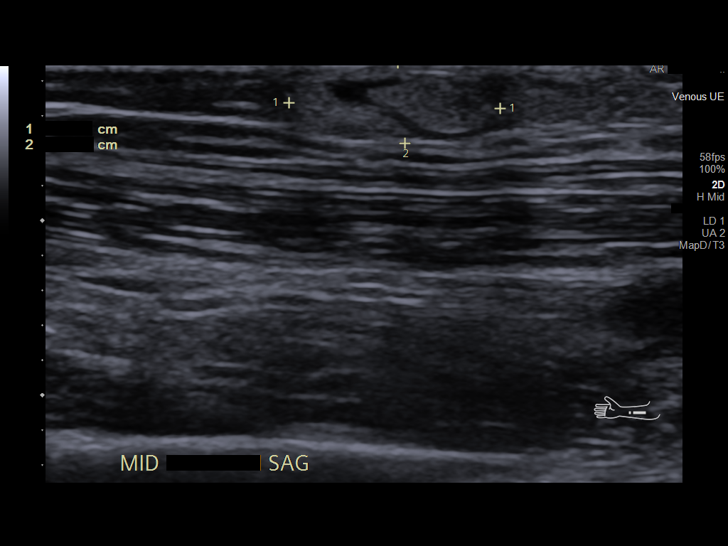
[im 52/52]
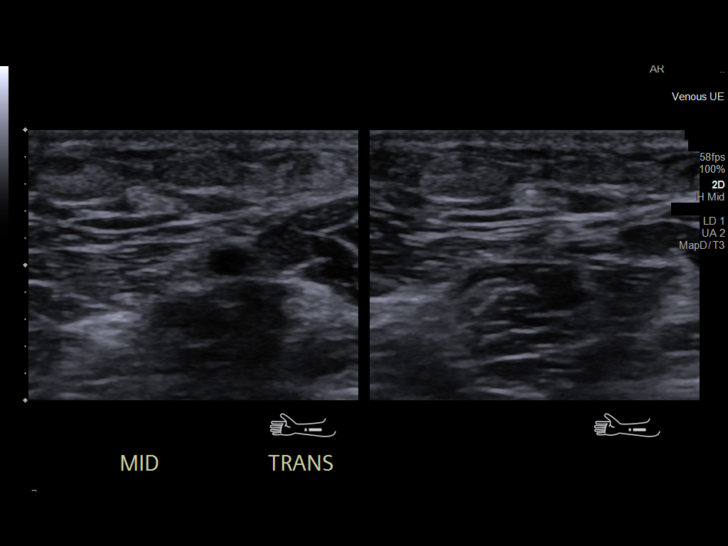

[13 of 24 positions shown; findings below may reference images not displayed]

FINDINGS: Contralateral Subclavian Vein: No evidence of thrombus. Normal color
Doppler flow and phasicity.

Internal Jugular Vein: No evidence of thrombus. Compressibility with
color Doppler flow and phasicity.

Subclavian Vein: No evidence of thrombus. Compressibility with color
Doppler flow and phasicity.

Axillary Vein: No evidence of thrombus. Normal compressibility,
color Doppler flow and augmentation.

Cephalic Vein: No evidence of thrombus. Normal compressibility,
color Doppler flow and augmentation.

Basilic Vein: No evidence of thrombus. Normal compressibility, color
Doppler flow and augmentation.

Brachial Veins: No evidence of thrombus. Normal compressibility,
color Doppler flow and augmentation.

Radial Veins: No evidence of thrombus.  Normal compressibility.

Ulnar Veins: No evidence of thrombus.  Normal compressibility.

Other Findings: Area of concern is the mid forearm. There are small
hyperechoic nodular structures just below the skin surface at the
area of concern. The largest structure measures 1.2 cm. These small
nodular structures do not appear to be vascular in etiology.
IMPRESSION: 1. No evidence for deep venous thrombosis in the right upper
extremity.
2. Small superficial hyperechoic structures at the area of concern
in the forearm. These structures do not appear to be vascular in
etiology. Findings could represent focal areas of inflammation or
even small lipomas.

## 2023-08-21 ENCOUNTER — Other Ambulatory Visit: Payer: Self-pay | Admitting: Hematology

## 2023-08-21 ENCOUNTER — Other Ambulatory Visit: Payer: Self-pay

## 2023-08-21 ENCOUNTER — Other Ambulatory Visit (HOSPITAL_COMMUNITY): Payer: Self-pay

## 2023-08-21 DIAGNOSIS — D471 Chronic myeloproliferative disease: Secondary | ICD-10-CM

## 2023-08-21 DIAGNOSIS — F411 Generalized anxiety disorder: Secondary | ICD-10-CM | POA: Diagnosis not present

## 2023-08-21 DIAGNOSIS — F330 Major depressive disorder, recurrent, mild: Secondary | ICD-10-CM | POA: Diagnosis not present

## 2023-08-21 MED ORDER — MOMELOTINIB DIHYDROCHLORIDE 100 MG PO TABS
100.0000 mg | ORAL_TABLET | Freq: Every day | ORAL | 3 refills | Status: DC
  Filled 2023-08-21 – 2023-08-23 (×2): qty 30, 30d supply, fill #0
  Filled 2023-09-20 – 2023-10-02 (×2): qty 30, 30d supply, fill #1
  Filled 2023-10-27: qty 30, 30d supply, fill #2
  Filled 2023-11-28: qty 30, 30d supply, fill #3

## 2023-08-23 ENCOUNTER — Other Ambulatory Visit: Payer: Self-pay | Admitting: Hematology

## 2023-08-23 ENCOUNTER — Other Ambulatory Visit: Payer: Self-pay

## 2023-08-23 ENCOUNTER — Other Ambulatory Visit: Payer: Self-pay | Admitting: Pharmacy Technician

## 2023-08-23 NOTE — Progress Notes (Signed)
 Specialty Pharmacy Refill Coordination Note  Renee Waters is a 78 y.o. female contacted today regarding refills of specialty medication(s) Momelotinib Dihydrochloride  (OJJAARA )   Patient requested Delivery   Delivery date: 08/29/23   Verified address: [address]  [place]   Medication will be filled on 08/28/23.

## 2023-08-24 ENCOUNTER — Inpatient Hospital Stay

## 2023-08-24 VITALS — BP 112/62 | HR 69 | Temp 96.6°F | Resp 20

## 2023-08-24 DIAGNOSIS — L299 Pruritus, unspecified: Secondary | ICD-10-CM | POA: Diagnosis not present

## 2023-08-24 DIAGNOSIS — D649 Anemia, unspecified: Secondary | ICD-10-CM

## 2023-08-24 DIAGNOSIS — D471 Chronic myeloproliferative disease: Secondary | ICD-10-CM

## 2023-08-24 DIAGNOSIS — N1832 Chronic kidney disease, stage 3b: Secondary | ICD-10-CM

## 2023-08-24 DIAGNOSIS — Z87891 Personal history of nicotine dependence: Secondary | ICD-10-CM | POA: Diagnosis not present

## 2023-08-24 DIAGNOSIS — Z95828 Presence of other vascular implants and grafts: Secondary | ICD-10-CM

## 2023-08-24 DIAGNOSIS — N184 Chronic kidney disease, stage 4 (severe): Secondary | ICD-10-CM | POA: Diagnosis not present

## 2023-08-24 DIAGNOSIS — E876 Hypokalemia: Secondary | ICD-10-CM | POA: Diagnosis not present

## 2023-08-24 LAB — SAMPLE TO BLOOD BANK

## 2023-08-24 LAB — CBC
HCT: 25.3 % — ABNORMAL LOW (ref 36.0–46.0)
Hemoglobin: 8.2 g/dL — ABNORMAL LOW (ref 12.0–15.0)
MCH: 30.7 pg (ref 26.0–34.0)
MCHC: 32.4 g/dL (ref 30.0–36.0)
MCV: 94.8 fL (ref 80.0–100.0)
Platelets: 313 K/uL (ref 150–400)
RBC: 2.67 MIL/uL — ABNORMAL LOW (ref 3.87–5.11)
RDW: 22.3 % — ABNORMAL HIGH (ref 11.5–15.5)
WBC: 12.3 K/uL — ABNORMAL HIGH (ref 4.0–10.5)
nRBC: 2 % — ABNORMAL HIGH (ref 0.0–0.2)

## 2023-08-24 LAB — COMPREHENSIVE METABOLIC PANEL WITH GFR
ALT: 22 U/L (ref 0–44)
AST: 29 U/L (ref 15–41)
Albumin: 3.5 g/dL (ref 3.5–5.0)
Alkaline Phosphatase: 74 U/L (ref 38–126)
Anion gap: 9 (ref 5–15)
BUN: 29 mg/dL — ABNORMAL HIGH (ref 8–23)
CO2: 18 mmol/L — ABNORMAL LOW (ref 22–32)
Calcium: 8.6 mg/dL — ABNORMAL LOW (ref 8.9–10.3)
Chloride: 112 mmol/L — ABNORMAL HIGH (ref 98–111)
Creatinine, Ser: 2.22 mg/dL — ABNORMAL HIGH (ref 0.44–1.00)
GFR, Estimated: 22 mL/min — ABNORMAL LOW (ref >60)
Glucose, Bld: 98 mg/dL (ref 70–99)
Potassium: 3.9 mmol/L (ref 3.5–5.1)
Sodium: 139 mmol/L (ref 135–145)
Total Bilirubin: 0.5 mg/dL (ref 0.0–1.2)
Total Protein: 7.5 g/dL (ref 6.5–8.1)

## 2023-08-24 MED ORDER — HEPARIN SOD (PORK) LOCK FLUSH 100 UNIT/ML IV SOLN
500.0000 [IU] | Freq: Once | INTRAVENOUS | Status: AC
  Administered 2023-08-24: 500 [IU] via INTRAVENOUS

## 2023-08-24 MED ORDER — DARBEPOETIN ALFA 100 MCG/0.5ML IJ SOSY
100.0000 ug | PREFILLED_SYRINGE | Freq: Once | INTRAMUSCULAR | Status: AC
  Administered 2023-08-24: 100 ug via SUBCUTANEOUS
  Filled 2023-08-24: qty 0.5

## 2023-08-24 MED ORDER — SODIUM CHLORIDE 0.9% FLUSH
10.0000 mL | INTRAVENOUS | Status: DC | PRN
  Administered 2023-08-24: 10 mL via INTRAVENOUS

## 2023-08-24 NOTE — Progress Notes (Signed)
 Kenedy Haisley presents today for injection per the provider's orders.  Aranesp  100 mcg  administration without incident; injection site WNL; see MAR for injection details.  Patient tolerated procedure well and without incident.  No questions or complaints noted at this time. Patient's hemoglobin noted to be 8.2 today.  Discharged from clinic ambulatory with cane in stable condition. Alert and oriented x 3. F/U with [facility] as scheduled.

## 2023-08-24 NOTE — Progress Notes (Signed)
 Patients port flushed without difficulty.  Good blood return noted with no bruising or swelling noted at site.  Band aid applied.  VSS with discharge and left in satisfactory condition with no s/s of distress noted.

## 2023-08-24 NOTE — Patient Instructions (Signed)
 [facility] [place] - A DEPT OF [place]  Discharge Instructions: Thank you for choosing [facility] to provide your oncology and hematology care.  If you have a lab appointment with the Cancer Center - please note that after April 8th, 2024, all labs will be drawn in the cancer center.  You do not have to check in or register with the main entrance as you have in the past but will complete your check-in in the cancer center.  Wear comfortable clothing and clothing appropriate for easy access to any Portacath or PICC line.   We strive to give you quality time with your provider. You may need to reschedule your appointment if you arrive late (15 or more minutes).  Arriving late affects you and other patients whose appointments are after yours.  Also, if you miss three or more appointments without notifying the office, you may be dismissed from the clinic at the provider's discretion.      For prescription refill requests, have your pharmacy contact our office and allow 72 hours for refills to be completed.    Today you received Aranesp  100 mcg injection.     BELOW ARE SYMPTOMS THAT SHOULD BE REPORTED IMMEDIATELY: *FEVER GREATER THAN 100.4 F (38 C) OR HIGHER *CHILLS OR SWEATING *NAUSEA AND VOMITING THAT IS NOT CONTROLLED WITH YOUR NAUSEA MEDICATION *UNUSUAL SHORTNESS OF BREATH *UNUSUAL BRUISING OR BLEEDING *URINARY PROBLEMS (pain or burning when urinating, or frequent urination) *BOWEL PROBLEMS (unusual diarrhea, constipation, pain near the anus) TENDERNESS IN MOUTH AND THROAT WITH OR WITHOUT PRESENCE OF ULCERS (sore throat, sores in mouth, or a toothache) UNUSUAL RASH, SWELLING OR PAIN  UNUSUAL VAGINAL DISCHARGE OR ITCHING   Items with * indicate a potential emergency and should be followed up as soon as possible or go to the Emergency Department if any problems should occur.  Please show the CHEMOTHERAPY ALERT CARD or IMMUNOTHERAPY ALERT CARD at  check-in to the Emergency Department and triage nurse.  Should you have questions after your visit or need to cancel or reschedule your appointment, please contact [facility]CER CTR [place] - A DEPT OF [facility] [phone]  and follow the prompts.  Office hours are 8:00 a.m. to 4:30 p.m. Monday - Friday. Please note that voicemails left after 4:00 p.m. may not be returned until the following business day.  We are closed weekends and major holidays. You have access to a nurse at all times for urgent questions. Please call the main number to the clinic [phone] and follow the prompts.  For any non-urgent questions, you may also contact your provider using MyChart. We now offer e-Visits for anyone 64 and older to request care online for non-urgent symptoms. For details visit [email or web address].   Also download the MyChart app! Go to the app store, search MyChart, open the app, select [facility], and log in with your MyChart username and password.

## 2023-08-28 ENCOUNTER — Other Ambulatory Visit: Payer: Self-pay

## 2023-08-28 DIAGNOSIS — Z515 Encounter for palliative care: Secondary | ICD-10-CM | POA: Diagnosis not present

## 2023-08-28 DIAGNOSIS — D47Z9 Other specified neoplasms of uncertain behavior of lymphoid, hematopoietic and related tissue: Secondary | ICD-10-CM | POA: Diagnosis not present

## 2023-09-03 ENCOUNTER — Other Ambulatory Visit: Payer: Self-pay | Admitting: Family Medicine

## 2023-09-03 ENCOUNTER — Other Ambulatory Visit: Payer: Self-pay | Admitting: Urology

## 2023-09-03 DIAGNOSIS — N390 Urinary tract infection, site not specified: Secondary | ICD-10-CM

## 2023-09-04 DIAGNOSIS — F411 Generalized anxiety disorder: Secondary | ICD-10-CM | POA: Diagnosis not present

## 2023-09-04 DIAGNOSIS — F331 Major depressive disorder, recurrent, moderate: Secondary | ICD-10-CM | POA: Diagnosis not present

## 2023-09-07 ENCOUNTER — Inpatient Hospital Stay

## 2023-09-07 ENCOUNTER — Telehealth: Payer: Self-pay

## 2023-09-07 ENCOUNTER — Inpatient Hospital Stay (HOSPITAL_BASED_OUTPATIENT_CLINIC_OR_DEPARTMENT_OTHER): Admitting: Hematology

## 2023-09-07 VITALS — BP 105/65 | HR 79 | Temp 98.4°F | Resp 18 | Wt 123.0 lb

## 2023-09-07 DIAGNOSIS — D649 Anemia, unspecified: Secondary | ICD-10-CM

## 2023-09-07 DIAGNOSIS — D471 Chronic myeloproliferative disease: Secondary | ICD-10-CM | POA: Diagnosis not present

## 2023-09-07 DIAGNOSIS — Z87891 Personal history of nicotine dependence: Secondary | ICD-10-CM | POA: Diagnosis not present

## 2023-09-07 DIAGNOSIS — L299 Pruritus, unspecified: Secondary | ICD-10-CM | POA: Diagnosis not present

## 2023-09-07 DIAGNOSIS — N1832 Chronic kidney disease, stage 3b: Secondary | ICD-10-CM

## 2023-09-07 DIAGNOSIS — N184 Chronic kidney disease, stage 4 (severe): Secondary | ICD-10-CM | POA: Diagnosis not present

## 2023-09-07 DIAGNOSIS — E876 Hypokalemia: Secondary | ICD-10-CM | POA: Diagnosis not present

## 2023-09-07 DIAGNOSIS — Z95828 Presence of other vascular implants and grafts: Secondary | ICD-10-CM

## 2023-09-07 LAB — SAMPLE TO BLOOD BANK

## 2023-09-07 LAB — CBC
HCT: 27.6 % — ABNORMAL LOW (ref 36.0–46.0)
Hemoglobin: 8.8 g/dL — ABNORMAL LOW (ref 12.0–15.0)
MCH: 30.2 pg (ref 26.0–34.0)
MCHC: 31.9 g/dL (ref 30.0–36.0)
MCV: 94.8 fL (ref 80.0–100.0)
Platelets: 450 K/uL — ABNORMAL HIGH (ref 150–400)
RBC: 2.91 MIL/uL — ABNORMAL LOW (ref 3.87–5.11)
RDW: 21.7 % — ABNORMAL HIGH (ref 11.5–15.5)
WBC: 19.3 K/uL — ABNORMAL HIGH (ref 4.0–10.5)
nRBC: 1.9 % — ABNORMAL HIGH (ref 0.0–0.2)

## 2023-09-07 MED ORDER — DARBEPOETIN ALFA 150 MCG/0.3ML IJ SOSY
150.0000 ug | PREFILLED_SYRINGE | Freq: Once | INTRAMUSCULAR | Status: AC
  Administered 2023-09-07: 150 ug via SUBCUTANEOUS
  Filled 2023-09-07: qty 0.3

## 2023-09-07 MED ORDER — HEPARIN SOD (PORK) LOCK FLUSH 100 UNIT/ML IV SOLN
500.0000 [IU] | Freq: Once | INTRAVENOUS | Status: AC
  Administered 2023-09-07: 500 [IU] via INTRAVENOUS

## 2023-09-07 MED ORDER — SODIUM CHLORIDE 0.9% FLUSH
10.0000 mL | INTRAVENOUS | Status: DC | PRN
  Administered 2023-09-07: 10 mL via INTRAVENOUS

## 2023-09-07 MED ORDER — DARBEPOETIN ALFA 200 MCG/0.4ML IJ SOSY
150.0000 ug | PREFILLED_SYRINGE | Freq: Once | INTRAMUSCULAR | Status: DC
  Filled 2023-09-07: qty 0.4

## 2023-09-07 NOTE — Patient Instructions (Signed)
 [facility] - A DEPT OF [facility]  Discharge Instructions: Thank you for choosing [facility] to provide your oncology and hematology care.  If you have a lab appointment with the Cancer Center - please note that after April 8th, 2024, all labs will be drawn in the cancer center.  You do not have to check in or register with the main entrance as you have in the past but will complete your check-in in the cancer center.  Wear comfortable clothing and clothing appropriate for easy access to any Portacath or PICC line.   We strive to give you quality time with your provider. You may need to reschedule your appointment if you arrive late (15 or more minutes).  Arriving late affects you and other patients whose appointments are after yours.  Also, if you miss three or more appointments without notifying the office, you may be dismissed from the clinic at the provider's discretion.      For prescription refill requests, have your pharmacy contact our office and allow 72 hours for refills to be completed.    Today you received the following :  Aranesp.  Darbepoetin Alfa Injection What is this medication? DARBEPOETIN ALFA (dar be POE e tin AL fa) treats low levels of red blood cells (anemia) caused by kidney disease or chemotherapy. It works by Systems analyst make more red blood cells, which reduces the need for blood transfusions. This medicine may be used for other purposes; ask your health care provider or pharmacist if you have questions. COMMON BRAND NAME(S): Aranesp What should I tell my care team before I take this medication? They need to know if you have any of these conditions: Blood clots Cancer Heart disease High blood pressure On dialysis Seizures Stroke An unusual or allergic reaction to darbepoetin, latex, other medications, foods, dyes, or preservatives Pregnant or trying to get pregnant Breast-feeding How should I use this  medication? This medication is injected into a vein or under the skin. It is usually given by a care team in a hospital or clinic setting. It may also be given at home. If you get this medication at home, you will be taught how to prepare and give it. Use exactly as directed. Take it as directed on the prescription label at the same time every day. Keep taking it unless your care team tells you to stop. It is important that you put your used needles and syringes in a special sharps container. Do not put them in a trash can. If you do not have a sharps container, call your pharmacist or care team to get one. A special MedGuide will be given to you by the pharmacist with each prescription and refill. Be sure to read this information carefully each time. Talk to your care team about the use of this medication in children. While this medication may be used in children as young as 1 month of age for selected conditions, precautions do apply. Overdosage: If you think you have taken too much of this medicine contact a poison control center or emergency room at once. NOTE: This medicine is only for you. Do not share this medicine with others. What if I miss a dose? If you miss a dose, take it as soon as you can. If it is almost time for your next dose, take only that dose. Do not take double or extra doses. What may interact with this medication? Epoetin alfa Methoxy  polyethylene glycol-epoetin beta This list may not describe all possible interactions. Give your health care provider a list of all the medicines, herbs, non-prescription drugs, or dietary supplements you use. Also tell them if you smoke, drink alcohol, or use illegal drugs. Some items may interact with your medicine. What should I watch for while using this medication? Visit your care team for regular checks on your progress. Check your blood pressure as directed. Know what your blood pressure should be and when to contact your care team. Your  condition will be monitored carefully while you are receiving this medication. You may need blood work while taking this medication. What side effects may I notice from receiving this medication? Side effects that you should report to your care team as soon as possible: Allergic reactions--skin rash, itching, hives, swelling of the face, lips, tongue, or throat Blood clot--pain, swelling, or warmth in the leg, shortness of breath, chest pain Heart attack--pain or tightness in the chest, shoulders, arms, or jaw, nausea, shortness of breath, cold or clammy skin, feeling faint or lightheaded Increase in blood pressure Rash, fever, and swollen lymph nodes Redness, blistering, peeling, or loosening of the skin, including inside the mouth Seizures Stroke--sudden numbness or weakness of the face, arm, or leg, trouble speaking, confusion, trouble walking, loss of balance or coordination, dizziness, severe headache, change in vision Side effects that usually do not require medical attention (report to your care team if they continue or are bothersome): Cough Stomach pain Swelling of the ankles, hands, or feet This list may not describe all possible side effects. Call your doctor for medical advice about side effects. You may report side effects to FDA at 1-800-FDA-1088. Where should I keep my medication? Keep out of the reach of children and pets. Store in a refrigerator. Do not freeze. Do not shake. Protect from light. Keep this medication in the original container until you are ready to take it. See product for storage information. Get rid of any unused medication after the expiration date. To get rid of medications that are no longer needed or have expired: Take the medication to a medication take-back program. Check with your pharmacy or law enforcement to find a location. If you cannot return the medication, ask your pharmacist or care team how to get rid of the medication safely. NOTE: This sheet  is a summary. It may not cover all possible information. If you have questions about this medicine, talk to your doctor, pharmacist, or health care provider.  2024 Elsevier/Gold Standard (2021-05-26 00:00:00)     To help prevent nausea and vomiting after your treatment, we encourage you to take your nausea medication as directed.  BELOW ARE SYMPTOMS THAT SHOULD BE REPORTED IMMEDIATELY: *FEVER GREATER THAN 100.4 F (38 C) OR HIGHER *CHILLS OR SWEATING *NAUSEA AND VOMITING THAT IS NOT CONTROLLED WITH YOUR NAUSEA MEDICATION *UNUSUAL SHORTNESS OF BREATH *UNUSUAL BRUISING OR BLEEDING *URINARY PROBLEMS (pain or burning when urinating, or frequent urination) *BOWEL PROBLEMS (unusual diarrhea, constipation, pain near the anus) TENDERNESS IN MOUTH AND THROAT WITH OR WITHOUT PRESENCE OF ULCERS (sore throat, sores in mouth, or a toothache) UNUSUAL RASH, SWELLING OR PAIN  UNUSUAL VAGINAL DISCHARGE OR ITCHING   Items with * indicate a potential emergency and should be followed up as soon as possible or go to the Emergency Department if any problems should occur.  Please show the CHEMOTHERAPY ALERT CARD or IMMUNOTHERAPY ALERT CARD at check-in to the Emergency Department and triage nurse.  Should you have questions  after your visit or need to cancel or reschedule your appointment, please contact [facility] - A DEPT OF [facility] [phone]  and follow the prompts.  Office hours are 8:00 a.m. to 4:30 p.m. Monday - Friday. Please note that voicemails left after 4:00 p.m. may not be returned until the following business day.  We are closed weekends and major holidays. You have access to a nurse at all times for urgent questions. Please call the main number to the clinic [phone] and follow the prompts.  For any non-urgent questions, you may also contact your provider using MyChart. We now offer e-Visits for anyone 14 and older to request care online for non-urgent  symptoms. For details visit [email or web address].   Also download the MyChart app! Go to the app store, search "MyChart", open the app, select [facility], and log in with your MyChart username and password.

## 2023-09-07 NOTE — Progress Notes (Signed)
 Patient presents today for labs, office visit, and Aranesp  injection. Patients port flushed without difficulty.  Good blood return noted with no bruising or swelling noted at site. Gauze dressing applied.  VSS. Patient in waiting room for appointment with Dr Rogers.

## 2023-09-07 NOTE — Telephone Encounter (Signed)
 Message sent on mychart

## 2023-09-07 NOTE — Patient Instructions (Signed)
 [facility] at [facility] Discharge Instructions   You were seen and examined today by Dr. Rogers.  He reviewed the results of your lab work which are normal/stable.   Continue Ojjaara  as prescribed.   We will see you back.   Return as scheduled.    Thank you for choosing [facility] at [facility] to provide your oncology and hematology care.  To afford each patient quality time with our provider, please arrive at least 15 minutes before your scheduled appointment time.   If you have a lab appointment with the Cancer Center please come in thru the Main Entrance and check in at the main information desk.  You need to re-schedule your appointment should you arrive 10 or more minutes late.  We strive to give you quality time with our providers, and arriving late affects you and other patients whose appointments are after yours.  Also, if you no show three or more times for appointments you may be dismissed from the clinic at the providers discretion.     Again, thank you for choosing [facility].  Our hope is that these requests will decrease the amount of time that you wait before being seen by our physicians.       _____________________________________________________________  Should you have questions after your visit to [facility], please contact our office at [phone] and follow the prompts.  Our office hours are 8:00 a.m. and 4:30 p.m. Monday - Friday.  Please note that voicemails left after 4:00 p.m. may not be returned until the following business day.  We are closed weekends and major holidays.  You do have access to a nurse 24-7, just call the main number to the clinic [phone] and do not press any options, hold on the line and a nurse will answer the phone.    For prescription refill requests, have your pharmacy contact our office and allow 72 hours.    Due to Covid, you will need to wear a mask  upon entering the hospital. If you do not have a mask, a mask will be given to you at the Main Entrance upon arrival. For doctor visits, patients may have 1 support person age 36 or older with them. For treatment visits, patients can not have anyone with them due to social distancing guidelines and our immunocompromised population.

## 2023-09-07 NOTE — Progress Notes (Signed)
Patient is taking Ojjaara as prescribed.  She has not missed any doses and reports no side effects at this time.   

## 2023-09-07 NOTE — Progress Notes (Signed)
 [facility] [address], [place] [address]    Clinic Day:  09/07/2023  Referring physician: Antonetta Rollene BRAVO, MD  Patient Care Team: Antonetta Rollene BRAVO, MD as PCP - General Mallipeddi, Diannah SQUIBB, MD as PCP - Cardiology (Cardiology) Shaaron Lamar HERO, MD (Gastroenterology) Margrette Taft BRAVO, MD as Consulting Physician (Orthopedic Surgery) Rogers Hai, MD as Medical Oncologist (Oncology) Celestia Joesph SQUIBB, RN as Oncology Nurse Navigator (Oncology) Ramonita Suzen CROME, RN as [facility], [facility] Ophthalmology (Ophthalmology)   ASSESSMENT & PLAN:   Assessment: 1. MPL positive prefibrotic/early primary myelofibrosis: - CBC on 11/06/2020 with white count 75.6, differential 59% neutrophils, 12% monocytes, 4% lymphocytes, 1 percentage of eosinophils and basophils, 6% band neutrophils, 12% myelocytes, 1% promyelocytes, 29% blasts. - Pathologist review of blood smear reported as leukoerythroblastic reaction. - 25 pound weight loss in the last 6 months, unintentional.  Decreased appetite.  Reports fatigue for the last few months.  Reports night sweats x3 in the last 1 month. - Bone marrow biopsy on 11/18/2020 consistent with granulocytic proliferation with differential including CMML versus myeloproliferative disorder. - BCR/ABL was negative. 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 - NGS myeloid panel shows mutations in ASXL1, MPL, TET2, EZH2 mutations. - Spleen ultrasound on 12/16/2020 shows mildly enlarged measuring 12.4 x 9.6 x 7 cm with volume of 435 cc. - PDGFR alpha, beta and FGFR 1 was negative. - She was evaluated by Dr. Perri at [facility].  Slides were reviewed at [facility] hematopathology.  They thought it was less likely CMML and more likely MPL positive myeloproliferative neoplasm.  Dr. Perri has recommended initiate Jakafi . - Given anemia, B symptoms, cytogenetics with MPL, ASLX1,  normal karyotype she is intermediate risk on GIPPS at high risk MIPSS70+ - Ruxolitinib  10 mg twice daily started on 02/16/2021.  Dose increased to 15 mg twice daily on 05/18/2021. - CTAP on 04/07/2021: Hepatomegaly and splenomegaly measuring 14.3 cm.  No other pathology.  No spleen infarcts. -Ruxolitinib  dose increased to 15 mg twice daily on 09/20/2021. - Ultrasound spleen (09/21/2021): 13.6 cm with volume 532 mL.  This is slightly improved from previous volume. - Bone marrow biopsy (09/09/2021): Material is limited but overall features consistent with myeloid neoplasm.  No increase in blasts.  Erythroid precursors appear decreased but megakaryocytes are variably evident with clusters and small forms.  Reticulin's shows variable increase in reticulin fibers including areas with prominent increase.  Chromosome analysis was normal. - Tapering doses of ruxolitinib  started on 12/24/2021, last dose on 01/23/2022 - Evaluated by Dr. Perri on 12/24/2021. - Momelotinib 100 mg daily started on 01/26/2022, dose increased to 150 mg daily on 07/19/2022 due to generalized itching.  Dose decreased to 100 mg daily on 08/09/2022.  2.  Social/family history: - She lives at home and is able to do all her ADLs and IADLs although she is getting tired lately.  She reports quitting smoking 6 months ago and smoked 1 pack/week for 43 years. - She believes that her mother had some kind of leukemia.  No other malignancies.    Plan: MPL positive myeloproliferative neoplasm: - She is tolerating momelotinib 100 mg daily reasonably well. - Physical exam: Spleen tip palpable on deep inspiration. - Ultrasound spleen (07/17/2022): Spleen volume is 284 mL, slightly increased from to 40 mL previously. - Labs today: White count ranging between 13-20 K.  White count today is 19.3.  Platelet count is 450, slightly up from  her baseline.  LFTs on 08/24/2023 were normal. - Continue momelotinib 100 mg daily.  She could not tolerate higher dose in  the past. - Recommend follow-up in 12 weeks.  Consider repeating ultrasound spleen in December.  2.  Decreased appetite: - Continue Megace  400 mg twice daily.  Weight has been stable.  3.  Normocytic anemia: - Anemia from CKD and functional iron deficiency and MPN. - We started her on Aranesp  100 mcg every 2 weeks on 07/27/2023.  Hemoglobin today improved to 8.8.  As there is very minimal improvement, I have recommended increasing Aranesp  to 150 mcg every 2 weeks.  Her latest ferritin is 267 and saturation is 33 on 07/27/2023.  Will repeat ferritin and iron panel at next visit.  4.  Hypokalemia: - Potassium is 3.9.  Continue potassium supplements.   5.  Generalized itching: - She had generalized itching previously.  She reported improvement in itching since last visit.  Uses Atarax  as needed.    Orders Placed This Encounter  Procedures   CBC with Differential    Standing Status:   Future    Expected Date:   12/04/2023    Expiration Date:   03/03/2024   Comprehensive metabolic panel    Standing Status:   Future    Expected Date:   12/04/2023    Expiration Date:   03/03/2024   Lactate dehydrogenase    Standing Status:   Future    Expected Date:   12/04/2023    Expiration Date:   03/03/2024   Iron and TIBC ([facility] ONLY)    Standing Status:   Future    Expected Date:   12/04/2023    Expiration Date:   03/03/2024   Ferritin    Standing Status:   Future    Expected Date:   12/04/2023    Expiration Date:   03/03/2024      LILLETTE Hummingbird R Teague,acting as a scribe for Alean Stands, MD.,have documented all relevant documentation on the behalf of Alean Stands, MD,as directed by  Alean Stands, MD while in the presence of Alean Stands, MD.  I, Alean Stands MD, have reviewed the above documentation for accuracy and completeness, and I agree with the above.     Alean Stands, MD   7/31/20254:56 PM  CHIEF COMPLAINT:    Diagnosis: MPL positive myeloproliferative neoplasm    Cancer Staging  No matching staging information was found for the patient.    Prior Therapy: Jakafi  10 mg twice daily   Current Therapy:  Momelotinib 100 mg daily    HISTORY OF PRESENT ILLNESS:   Oncology History   No history exists.     INTERVAL HISTORY:   Tanesia is a 78 y.o. female presenting to clinic today for follow up of MPL positive myeloproliferative neoplasm. She was last seen by me on 07/27/2023.  Today, she states that she is doing well overall. Her appetite level is at 80%. Her energy level is at 80%. Renee Waters is accompanied by family members. Her appetite waxes and wanes, though her daughter states her appetite has slightly worsened since 6 months ago. She is taking megace  without improvement in eating. She is not drinking Ensure or Boost.   Myldred reports chronic right hip and right leg pain that has worsened recently with pain becoming constant. Pain is attributable to arthritis in the right hip and right knee, as well as spinal issues. She states multiple attempts at hip surgery have been made without success.   Adonna is  tolerating Momelotinib well and denies any side effects.   Her generalized itching has mostly resolved and she has had 2 episodes of itching since her last visit with me.   Maxima states she is not active most of the day, though she is able to cook dinner and be outside in her garden most days.   PAST MEDICAL HISTORY:   Past Medical History: Past Medical History:  Diagnosis Date   Acute cholangitis    Acute respiratory failure with hypoxia (HCC) 09/01/2021   Allergy    Anemia    Anxiety    Arthritis    Phreesia 03/18/2020   Barrett's esophagus    Cataract    Chronic back pain    Chronic neck pain    CKD (chronic kidney disease) stage 3, GFR 30-59 ml/min (HCC) 10/29/2020   Depression    Dyslipidemia 07/16/2023   Genital herpes    GERD (gastroesophageal reflux disease)     H/O degenerative disc disease    History of blood transfusion    Hypertension    Hypoxia 12/01/2021   Insomnia    Lupus (systemic lupus erythematosus) (HCC)    Neuromuscular disorder (HCC)    Osteoarthritis    S/P colonoscopy 07/2003   normal, no polyps   S/P endoscopy June 2005, Oct 2009   2005: short-segment Barrett's, 2009: short-segment Barrett's   Upper respiratory tract infection 07/09/2020   UTI (lower urinary tract infection) 11/2012    Surgical History: Past Surgical History:  Procedure Laterality Date   ABDOMINAL HYSTERECTOMY     BACK SURGERY     BIOPSY N/A 03/20/2014   Procedure: BIOPSY;  Surgeon: Lamar CHRISTELLA Hollingshead, MD;  Location: [facility];  Service: Endoscopy;  Laterality: N/A;   BIOPSY  09/14/2015   Procedure: BIOPSY;  Surgeon: Lamar CHRISTELLA Hollingshead, MD;  Location: [facility];  Service: Endoscopy;;  esophageal and gastric   BIOPSY  07/23/2019   Procedure: BIOPSY;  Surgeon: Hollingshead Lamar CHRISTELLA, MD;  Location: [facility];  Service: Endoscopy;;  esophageal    CARPAL TUNNEL RELEASE Left 2013   cervical disectomy  2002   CESAREAN SECTION N/A    Phreesia 03/18/2020   CHOLECYSTECTOMY     with lysis of adhesions for sbo; ruptured gallbladder.   COLONOSCOPY  11/09/2011   RMR: Melanosis coli   COLONOSCOPY WITH PROPOFOL  N/A 09/14/2015   Dr. Hollingshead: diverticulosis, 3mm TA removed. next TCS 09/2020.    COLONOSCOPY WITH PROPOFOL  N/A 04/30/2020   Procedure: COLONOSCOPY WITH PROPOFOL ;  Surgeon: Hollingshead Lamar CHRISTELLA, MD;  Location: [facility];  Service: Endoscopy;  Laterality: N/A;  PM (ASA 3)   DENTAL SURGERY  11/2015   multiple tooth extraction   ESOPHAGOGASTRODUODENOSCOPY  11/29/2007   salmon-colored  tongue   longest stable at  3 cm, distal esophagus as described previously status post biopsy/ Hiatal hernia, otherwise normal stomach D1 and D2   ESOPHAGOGASTRODUODENOSCOPY  01/06/11   short segment Barrett's esophagus s/p bx/Hiatal hernia   ESOPHAGOGASTRODUODENOSCOPY (EGD) WITH PROPOFOL  N/A  03/20/2014   MFM:[email or web address] distal esophagus short segment barrett's, bx with no dysplasia. next egd in 03/2017   ESOPHAGOGASTRODUODENOSCOPY (EGD) WITH PROPOFOL  N/A 09/14/2015   Dr. Hollingshead: Barrett's without dysplasia, gastritis benign bx, hiatal hernia. next EGD 09/2018.   ESOPHAGOGASTRODUODENOSCOPY (EGD) WITH PROPOFOL  N/A 07/23/2019   Procedure: ESOPHAGOGASTRODUODENOSCOPY (EGD) WITH PROPOFOL ;  Surgeon: Hollingshead Lamar CHRISTELLA, MD;  Location: [facility];  Service: Endoscopy;  Laterality: N/A;  3:00pm   EYE SURGERY N/A    Phreesia  03/18/2020   HERNIA REPAIR Right 07/2010   Dr. Blase   IR IMAGING GUIDED PORT INSERTION  02/09/2021   JOINT REPLACEMENT     LAPAROSCOPIC CHOLECYSTECTOMY  2017   at [facility]   POLYPECTOMY  09/14/2015   Procedure: POLYPECTOMY;  Surgeon: Lamar CHRISTELLA Hollingshead, MD;  Location: [facility];  Service: Endoscopy;;  ascending colon   right hip replacement  07/2010   went back in sept 2012 to fix   SHOULDER ARTHROSCOPY  2008   left   SPINE SURGERY N/A    Phreesia 03/18/2020   TOTAL HIP REVISION Right 12/17/2012   Procedure: RIGHT TOTAL HIP REVISION;  Surgeon: Donnice JONETTA Car, MD;  Location: [facility];  Service: Orthopedics;  Laterality: Right;   WRIST SURGERY Right 2011   open reduction right wrist.    Social History: Social History   Socioeconomic History   Marital status: Married    Spouse name: louis   Number of children: 4   Years of education: 12+   Highest education level: Some college, no degree  Occupational History   Occupation: Audiological scientist  - retired   Occupation: non profit  Tobacco Use   Smoking status: Former    Current packs/day: 0.00    Average packs/day: 0.3 packs/day for 25.0 years (6.3 ttl pk-yrs)    Types: Cigarettes    Start date: 02/06/1978    Quit date: 02/07/2003    Years since quitting: 20.5   Smokeless tobacco: Never   Tobacco comments:    quit in 2004  Vaping Use   Vaping status: Never Used  Substance and Sexual Activity   Alcohol use: No   Drug  use: No   Sexual activity: Yes    Birth control/protection: Surgical  Other Topics Concern   Not on file  Social History Narrative   Not on file   Social Drivers of Health   Financial Resource Strain: High Risk (02/06/2023)   Overall Financial Resource Strain (CARDIA)    Difficulty of Paying Living Expenses: Hard  Food Insecurity: No Food Insecurity (02/06/2023)   Hunger Vital Sign    Worried About Running Out of Food in the Last Year: Never true    Ran Out of Food in the Last Year: Never true  Transportation Needs: No Transportation Needs (02/06/2023)   PRAPARE - Administrator, Civil Service (Medical): No    Lack of Transportation (Non-Medical): No  Physical Activity: Patient Declined (02/06/2023)   Exercise Vital Sign    Days of Exercise per Week: Patient declined    Minutes of Exercise per Session: Patient declined  Stress: No Stress Concern Present (02/06/2023)   [employer] of Occupational Health - Occupational Stress Questionnaire    Feeling of Stress : Not at all  Social Connections: Moderately Isolated (02/06/2023)   Social Connection and Isolation Panel    Frequency of Communication with Friends and Family: More than three times a week    Frequency of Social Gatherings with Friends and Family: Once a week    Attends Religious Services: Never    Database administrator or Organizations: No    Attends Banker Meetings: Never    Marital Status: Married  Catering manager Violence: Not At Risk (02/06/2023)   Humiliation, Afraid, Rape, and Kick questionnaire    Fear of Current or Ex-Partner: No    Emotionally Abused: No    Physically Abused: No    Sexually Abused: No    Family History: Family History  Problem Relation Age of Onset   Hypertension Mother    Stroke Mother    Colon cancer Neg Hx    Anesthesia problems Neg Hx    Hypotension Neg Hx    Malignant hyperthermia Neg Hx    Pseudochol deficiency Neg Hx    Gastric cancer Neg Hx     Esophageal cancer Neg Hx     Current Medications:  Current Outpatient Medications:    acyclovir  (ZOVIRAX ) 800 MG tablet, TAKE 1 TABLET BY MOUTH FIVE TIMES DAILY AS NEEDED, Disp: 30 tablet, Rfl: 0   allopurinol  (ZYLOPRIM ) 100 MG tablet, Take 2 tablets by mouth once daily, Disp: 60 tablet, Rfl: 0   EPINEPHrine  0.3 mg/0.3 mL IJ SOAJ injection, INJECT 0.3 MLS INTO MUSCLE ONCE AS NEEDED, Disp: 1 each, Rfl: 2   hydrOXYzine  (ATARAX ) 50 MG tablet, Take 1 tablet by mouth three times daily as needed, Disp: 90 tablet, Rfl: 3   lidocaine -prilocaine  (EMLA ) cream, APPLY TOPICALLY AS NEEDED TO ACCESS PORT, Disp: 30 g, Rfl: 5   megestrol  (MEGACE ) 400 MG/10ML suspension, Take 10 mLs (400 mg total) by mouth 2 (two) times daily., Disp: 480 mL, Rfl: 2   metoprolol  tartrate (LOPRESSOR ) 25 MG tablet, Take half tablet by mouthtwo times daily, Disp: 90 tablet, Rfl: 1   mirtazapine  (REMERON ) 15 MG tablet, TAKE 1 TABLET BY MOUTH AT BEDTIME, Disp: 30 tablet, Rfl: 5   momelotinib dihydrochloride  (OJJAARA ) 100 MG tablet, Take 1 tablet (100 mg total) by mouth daily., Disp: 30 tablet, Rfl: 3   Multiple Vitamin (MULTIVITAMIN WITH MINERALS) TABS tablet, Take 1 tablet by mouth daily., Disp: 130 tablet, Rfl: 1   Naldemedine Tosylate  (SYMPROIC ) 0.2 MG TABS, Take 0.2 mg by mouth daily., Disp: 30 tablet, Rfl: 11   oxyCODONE  (ROXICODONE ) 15 MG immediate release tablet, Take 15 mg by mouth every 6 (six) hours as needed., Disp: , Rfl:    pantoprazole  (PROTONIX ) 40 MG tablet, Take 1 tablet (40 mg total) by mouth 2 (two) times daily before a meal., Disp: 60 tablet, Rfl: 11   potassium chloride  (KLOR-CON ) 10 MEQ tablet, Take 1 tablet by mouth once daily, Disp: 30 tablet, Rfl: 0   pregabalin  (LYRICA ) 25 MG capsule, Take 1 capsule (25 mg total) by mouth 3 (three) times daily., Disp: 90 capsule, Rfl: 3   prochlorperazine  (COMPAZINE ) 10 MG tablet, TAKE 1 TABLET BY MOUTH EVERY 6 HOURS AS NEEDED FOR NAUSEA OR VOMITING, Disp: 60 tablet, Rfl:  0   temazepam  (RESTORIL ) 30 MG capsule, Take 1 capsule (30 mg total) by mouth at bedtime as needed for sleep., Disp: 30 capsule, Rfl: 5   triamcinolone  ointment (KENALOG ) 0.5 %, Apply topically daily., Disp: 30 g, Rfl: 0   trimethoprim  (TRIMPEX ) 100 MG tablet, Take 1 tablet by mouth once daily, Disp: 30 tablet, Rfl: 3 No current facility-administered medications for this visit.  Facility-Administered Medications Ordered in Other Visits:    lanreotide acetate  (SOMATULINE DEPOT ) 120 MG/0.5ML injection, , , ,    lanreotide acetate  (SOMATULINE DEPOT ) 120 MG/0.5ML injection, , , ,    lanreotide acetate  (SOMATULINE DEPOT ) 120 MG/0.5ML injection, , , ,    octreotide  (SANDOSTATIN  LAR) 30 MG IM injection, , , ,    octreotide  (SANDOSTATIN  LAR) 30 MG IM injection, , , ,    Allergies: Allergies  Allergen Reactions   Bee Venom Swelling and Hives   Acetaminophen  Itching    Other Reaction(s): itching, rash   Norvasc  [Amlodipine ] Other (See Comments)    Hair loss  Hydralazine  Other (See Comments)    Hair loss   Red Dye #40 (Allura Red) Itching   Percocet [Oxycodone -Acetaminophen ] Rash and Other (See Comments)    Pt states, this gives her a rash, but at home she takes oxycodone  for pain relief    Tyloxapol Itching and Rash    REVIEW OF SYSTEMS:   Review of Systems  Constitutional:  Negative for chills, fatigue and fever.  HENT:   Negative for lump/mass, mouth sores, nosebleeds, sore throat and trouble swallowing.   Eyes:  Negative for eye problems.  Respiratory:  Negative for cough and shortness of breath.   Cardiovascular:  Negative for chest pain, leg swelling and palpitations.  Gastrointestinal:  Negative for abdominal pain, constipation, diarrhea, nausea and vomiting.  Genitourinary:  Negative for bladder incontinence, difficulty urinating, dysuria, frequency, hematuria and nocturia.   Musculoskeletal:  Negative for arthralgias, back pain, flank pain, myalgias and neck pain.        +right hip and right leg pain, 8/10 severity  Skin:  Negative for itching and rash.  Neurological:  Positive for headaches. Negative for dizziness and numbness.  Hematological:  Does not bruise/bleed easily.  Psychiatric/Behavioral:  Negative for depression, sleep disturbance and suicidal ideas. The patient is not nervous/anxious.   All other systems reviewed and are negative.    VITALS:   There were no vitals taken for this visit.  Wt Readings from Last 3 Encounters:  09/07/23 123 lb 0.3 oz (55.8 kg)  08/15/23 127 lb 6.4 oz (57.8 kg)  07/27/23 123 lb (55.8 kg)    There is no height or weight on file to calculate BMI.  Performance status (ECOG): 1 - Symptomatic but completely ambulatory  PHYSICAL EXAM:   Physical Exam Vitals and nursing note reviewed. Exam conducted with a chaperone present.  Constitutional:      Appearance: Normal appearance.  Cardiovascular:     Rate and Rhythm: Normal rate and regular rhythm.     Pulses: Normal pulses.     Heart sounds: Normal heart sounds.  Pulmonary:     Effort: Pulmonary effort is normal.     Breath sounds: Normal breath sounds.  Abdominal:     Palpations: Abdomen is soft. There is splenomegaly (spleen tip palpable). There is no hepatomegaly or mass.     Tenderness: There is no abdominal tenderness.  Musculoskeletal:     Right lower leg: No edema.     Left lower leg: No edema.  Lymphadenopathy:     Cervical: No cervical adenopathy.     Right cervical: No superficial, deep or posterior cervical adenopathy.    Left cervical: No superficial, deep or posterior cervical adenopathy.     Upper Body:     Right upper body: No supraclavicular or axillary adenopathy.     Left upper body: No supraclavicular or axillary adenopathy.  Neurological:     General: No focal deficit present.     Mental Status: She is alert and oriented to person, place, and time.  Psychiatric:        Mood and Affect: Mood normal.        Behavior: Behavior normal.      LABS:      Latest Ref Rng & Units 09/07/2023    1:36 PM 08/24/2023    1:40 PM 08/10/2023   12:21 PM  CBC  WBC 4.0 - 10.5 K/uL 19.3  12.3  21.1   Hemoglobin 12.0 - 15.0 g/dL 8.8  8.2  8.6   Hematocrit 36.0 -  46.0 % 27.6  25.3  26.7   Platelets 150 - 400 K/uL 450  313  280       Latest Ref Rng & Units 08/24/2023    1:40 PM 08/10/2023   12:21 PM 07/27/2023    1:11 PM  CMP  Glucose 70 - 99 mg/dL 98  881  98   BUN 8 - 23 mg/dL 29  29  28    Creatinine 0.44 - 1.00 mg/dL 7.77  8.13  7.87   Sodium 135 - 145 mmol/L 139  138  135   Potassium 3.5 - 5.1 mmol/L 3.9  4.1  4.3   Chloride 98 - 111 mmol/L 112  111  104   CO2 22 - 32 mmol/L 18  18  23    Calcium  8.9 - 10.3 mg/dL 8.6  8.3  9.1   Total Protein 6.5 - 8.1 g/dL 7.5  7.9  8.2   Total Bilirubin 0.0 - 1.2 mg/dL 0.5  0.7  0.6   Alkaline Phos 38 - 126 U/L 74  75  94   AST 15 - 41 U/L 29  21  26    ALT 0 - 44 U/L 22  12  13       No results found for: CEA1, CEA / No results found for: CEA1, CEA No results found for: PSA1 No results found for: CAN199 No results found for: CAN125  No results found for: STEPHANY RINGS, A1GS, A2GS, BETS, BETA2SER, GAMS, MSPIKE, SPEI Lab Results  Component Value Date   TIBC 254 07/27/2023   TIBC 329 08/25/2015   TIBC 336 02/21/2011   FERRITIN 267 07/27/2023   FERRITIN 26 08/25/2015   FERRITIN 81 02/21/2011   IRONPCTSAT 33 (H) 07/27/2023   IRONPCTSAT 8 (L) 08/25/2015   IRONPCTSAT 23 02/21/2011   Lab Results  Component Value Date   LDH 457 (H) 07/27/2023   LDH 260 (H) 01/26/2023   LDH 588 (H) 07/19/2022     STUDIES:   No results found.

## 2023-09-07 NOTE — Telephone Encounter (Signed)
 Copied from CRM [id number]. Topic: Clinical - Medical Advice >> Sep 07, 2023 12:37 PM Nathanel BROCKS wrote: Reason for CRM: pt called in today and stated that she has been going to therapy and the therapist advised her to call her pcp and see if she can get something for her nerves. Please call pt

## 2023-09-07 NOTE — Progress Notes (Signed)
 Patient's Hgb 8.8. VSS Patient tolerated Aranesp  injection in left arm well injection with no complaints voiced.  Site clean and dry with no bruising or swelling noted.  No complaints of pain.  Discharged with vital signs stable and no signs or symptoms of distress noted.

## 2023-09-12 DIAGNOSIS — F330 Major depressive disorder, recurrent, mild: Secondary | ICD-10-CM | POA: Diagnosis not present

## 2023-09-20 ENCOUNTER — Other Ambulatory Visit: Payer: Self-pay

## 2023-09-21 ENCOUNTER — Other Ambulatory Visit: Payer: Self-pay | Admitting: Family Medicine

## 2023-09-21 ENCOUNTER — Inpatient Hospital Stay: Attending: Hematology

## 2023-09-21 ENCOUNTER — Inpatient Hospital Stay

## 2023-09-21 VITALS — BP 127/64 | HR 71 | Temp 97.8°F | Resp 18

## 2023-09-21 VITALS — BP 147/66 | HR 66 | Temp 97.0°F | Resp 18

## 2023-09-21 DIAGNOSIS — D649 Anemia, unspecified: Secondary | ICD-10-CM | POA: Insufficient documentation

## 2023-09-21 DIAGNOSIS — E876 Hypokalemia: Secondary | ICD-10-CM | POA: Insufficient documentation

## 2023-09-21 DIAGNOSIS — N184 Chronic kidney disease, stage 4 (severe): Secondary | ICD-10-CM | POA: Diagnosis not present

## 2023-09-21 DIAGNOSIS — D471 Chronic myeloproliferative disease: Secondary | ICD-10-CM | POA: Diagnosis not present

## 2023-09-21 DIAGNOSIS — D631 Anemia in chronic kidney disease: Secondary | ICD-10-CM | POA: Diagnosis present

## 2023-09-21 DIAGNOSIS — Z79899 Other long term (current) drug therapy: Secondary | ICD-10-CM | POA: Insufficient documentation

## 2023-09-21 DIAGNOSIS — N1832 Chronic kidney disease, stage 3b: Secondary | ICD-10-CM

## 2023-09-21 LAB — CBC
HCT: 27.8 % — ABNORMAL LOW (ref 36.0–46.0)
Hemoglobin: 8.7 g/dL — ABNORMAL LOW (ref 12.0–15.0)
MCH: 30.2 pg (ref 26.0–34.0)
MCHC: 31.3 g/dL (ref 30.0–36.0)
MCV: 96.5 fL (ref 80.0–100.0)
Platelets: 521 K/uL — ABNORMAL HIGH (ref 150–400)
RBC: 2.88 MIL/uL — ABNORMAL LOW (ref 3.87–5.11)
RDW: 23.3 % — ABNORMAL HIGH (ref 11.5–15.5)
WBC: 35.6 K/uL — ABNORMAL HIGH (ref 4.0–10.5)
nRBC: 3 % — ABNORMAL HIGH (ref 0.0–0.2)

## 2023-09-21 LAB — SAMPLE TO BLOOD BANK

## 2023-09-21 MED ORDER — DARBEPOETIN ALFA 150 MCG/0.3ML IJ SOSY
150.0000 ug | PREFILLED_SYRINGE | Freq: Once | INTRAMUSCULAR | Status: AC
  Administered 2023-09-21: 150 ug via SUBCUTANEOUS
  Filled 2023-09-21: qty 0.3

## 2023-09-21 MED ORDER — DARBEPOETIN ALFA 200 MCG/0.4ML IJ SOSY: 150.0000 ug | PREFILLED_SYRINGE | Freq: Once | INTRAMUSCULAR | Status: DC

## 2023-09-21 NOTE — Progress Notes (Signed)
 Patient to receive Aranesp  injection. Patient's Hgb 8.7. Patient tolerated injection in left arm with no complaints voiced.  Site clean and dry with no bruising or swelling noted.  No complaints of pain.  Discharged with vital signs stable and no signs or symptoms of distress noted.

## 2023-09-21 NOTE — Progress Notes (Signed)
 Patient presents today for portflush with labs and Aranesp  injection. Patients port flushed without difficulty.  Good blood return noted with no bruising or swelling noted at site.  Band aid applied.  VSS. Will wait for blood results for Aranesp  injection.

## 2023-09-21 NOTE — Patient Instructions (Signed)
 [facility] [place] - A DEPT OF [facility]  Discharge Instructions: Thank you for choosing [facility] to provide your oncology and hematology care.  If you have a lab appointment with the Cancer Center - please note that after April 8th, 2024, all labs will be drawn in the cancer center.  You do not have to check in or register with the main entrance as you have in the past but will complete your check-in in the cancer center.  Wear comfortable clothing and clothing appropriate for easy access to any Portacath or PICC line.   We strive to give you quality time with your provider. You may need to reschedule your appointment if you arrive late (15 or more minutes).  Arriving late affects you and other patients whose appointments are after yours.  Also, if you miss three or more appointments without notifying the office, you may be dismissed from the clinic at the provider's discretion.      For prescription refill requests, have your pharmacy contact our office and allow 72 hours for refills to be completed.    Today you received the following Aranesp injection.  Darbepoetin Alfa Injection What is this medication? DARBEPOETIN ALFA (dar be POE e tin AL fa) treats low levels of red blood cells (anemia) caused by kidney disease or chemotherapy. It works by Systems analyst make more red blood cells, which reduces the need for blood transfusions. This medicine may be used for other purposes; ask your health care provider or pharmacist if you have questions. COMMON BRAND NAME(S): Aranesp What should I tell my care team before I take this medication? They need to know if you have any of these conditions: Blood clots Cancer Heart disease High blood pressure On dialysis Seizures Stroke An unusual or allergic reaction to darbepoetin, latex, other medications, foods, dyes, or preservatives Pregnant or trying to get pregnant Breast-feeding How should I use  this medication? This medication is injected into a vein or under the skin. It is usually given by a care team in a hospital or clinic setting. It may also be given at home. If you get this medication at home, you will be taught how to prepare and give it. Use exactly as directed. Take it as directed on the prescription label at the same time every day. Keep taking it unless your care team tells you to stop. It is important that you put your used needles and syringes in a special sharps container. Do not put them in a trash can. If you do not have a sharps container, call your pharmacist or care team to get one. A special MedGuide will be given to you by the pharmacist with each prescription and refill. Be sure to read this information carefully each time. Talk to your care team about the use of this medication in children. While this medication may be used in children as young as 1 month of age for selected conditions, precautions do apply. Overdosage: If you think you have taken too much of this medicine contact a poison control center or emergency room at once. NOTE: This medicine is only for you. Do not share this medicine with others. What if I miss a dose? If you miss a dose, take it as soon as you can. If it is almost time for your next dose, take only that dose. Do not take double or extra doses. What may interact with this medication? Epoetin alfa Methoxy polyethylene  glycol-epoetin beta This list may not describe all possible interactions. Give your health care provider a list of all the medicines, herbs, non-prescription drugs, or dietary supplements you use. Also tell them if you smoke, drink alcohol, or use illegal drugs. Some items may interact with your medicine. What should I watch for while using this medication? Visit your care team for regular checks on your progress. Check your blood pressure as directed. Know what your blood pressure should be and when to contact your care team.  Your condition will be monitored carefully while you are receiving this medication. You may need blood work while taking this medication. What side effects may I notice from receiving this medication? Side effects that you should report to your care team as soon as possible: Allergic reactions--skin rash, itching, hives, swelling of the face, lips, tongue, or throat Blood clot--pain, swelling, or warmth in the leg, shortness of breath, chest pain Heart attack--pain or tightness in the chest, shoulders, arms, or jaw, nausea, shortness of breath, cold or clammy skin, feeling faint or lightheaded Increase in blood pressure Rash, fever, and swollen lymph nodes Redness, blistering, peeling, or loosening of the skin, including inside the mouth Seizures Stroke--sudden numbness or weakness of the face, arm, or leg, trouble speaking, confusion, trouble walking, loss of balance or coordination, dizziness, severe headache, change in vision Side effects that usually do not require medical attention (report to your care team if they continue or are bothersome): Cough Stomach pain Swelling of the ankles, hands, or feet This list may not describe all possible side effects. Call your doctor for medical advice about side effects. You may report side effects to FDA at 1-800-FDA-1088. Where should I keep my medication? Keep out of the reach of children and pets. Store in a refrigerator. Do not freeze. Do not shake. Protect from light. Keep this medication in the original container until you are ready to take it. See product for storage information. Get rid of any unused medication after the expiration date. To get rid of medications that are no longer needed or have expired: Take the medication to a medication take-back program. Check with your pharmacy or law enforcement to find a location. If you cannot return the medication, ask your pharmacist or care team how to get rid of the medication safely. NOTE: This  sheet is a summary. It may not cover all possible information. If you have questions about this medicine, talk to your doctor, pharmacist, or health care provider.  2024 Elsevier/Gold Standard (2021-05-26 00:00:00)    To help prevent nausea and vomiting after your treatment, we encourage you to take your nausea medication as directed.  BELOW ARE SYMPTOMS THAT SHOULD BE REPORTED IMMEDIATELY: *FEVER GREATER THAN 100.4 F (38 C) OR HIGHER *CHILLS OR SWEATING *NAUSEA AND VOMITING THAT IS NOT CONTROLLED WITH YOUR NAUSEA MEDICATION *UNUSUAL SHORTNESS OF BREATH *UNUSUAL BRUISING OR BLEEDING *URINARY PROBLEMS (pain or burning when urinating, or frequent urination) *BOWEL PROBLEMS (unusual diarrhea, constipation, pain near the anus) TENDERNESS IN MOUTH AND THROAT WITH OR WITHOUT PRESENCE OF ULCERS (sore throat, sores in mouth, or a toothache) UNUSUAL RASH, SWELLING OR PAIN  UNUSUAL VAGINAL DISCHARGE OR ITCHING   Items with * indicate a potential emergency and should be followed up as soon as possible or go to the Emergency Department if any problems should occur.  Please show the CHEMOTHERAPY ALERT CARD or IMMUNOTHERAPY ALERT CARD at check-in to the Emergency Department and triage nurse.  Should you have questions after your  visit or need to cancel or reschedule your appointment, please contact [facility] [place] - A DEPT OF [facility] [phone]  and follow the prompts.  Office hours are 8:00 a.m. to 4:30 p.m. Monday - Friday. Please note that voicemails left after 4:00 p.m. may not be returned until the following business day.  We are closed weekends and major holidays. You have access to a nurse at all times for urgent questions. Please call the main number to the clinic [phone] and follow the prompts.  For any non-urgent questions, you may also contact your provider using MyChart. We now offer e-Visits for anyone 77 and older to request care online for non-urgent  symptoms. For details visit [email or web address].   Also download the MyChart app! Go to the app store, search "MyChart", open the app, select [place], and log in with your MyChart username and password.

## 2023-09-22 ENCOUNTER — Other Ambulatory Visit: Payer: Self-pay

## 2023-09-25 ENCOUNTER — Other Ambulatory Visit: Payer: Self-pay | Admitting: *Deleted

## 2023-09-25 MED ORDER — ALLOPURINOL 100 MG PO TABS
200.0000 mg | ORAL_TABLET | Freq: Every day | ORAL | 0 refills | Status: DC
Start: 2023-09-25 — End: 2023-10-24

## 2023-09-26 ENCOUNTER — Ambulatory Visit: Admitting: Urology

## 2023-09-29 ENCOUNTER — Encounter: Payer: Self-pay | Admitting: Radiology

## 2023-10-02 ENCOUNTER — Other Ambulatory Visit: Payer: Self-pay

## 2023-10-02 ENCOUNTER — Other Ambulatory Visit (HOSPITAL_COMMUNITY): Payer: Self-pay

## 2023-10-02 NOTE — Progress Notes (Signed)
 Specialty Pharmacy Refill Coordination Note  Kelleigh Skerritt is a 78 y.o. female contacted today regarding refills of specialty medication(s) Momelotinib Dihydrochloride  (OJJAARA )   Patient requested Delivery   Delivery date: 10/03/23   Verified address: [address]  [place]   Medication will be filled on 10/02/23.

## 2023-10-03 DIAGNOSIS — F411 Generalized anxiety disorder: Secondary | ICD-10-CM | POA: Diagnosis not present

## 2023-10-03 DIAGNOSIS — F330 Major depressive disorder, recurrent, mild: Secondary | ICD-10-CM | POA: Diagnosis not present

## 2023-10-05 ENCOUNTER — Inpatient Hospital Stay

## 2023-10-05 ENCOUNTER — Ambulatory Visit: Admitting: Urology

## 2023-10-05 DIAGNOSIS — D649 Anemia, unspecified: Secondary | ICD-10-CM

## 2023-10-05 DIAGNOSIS — D471 Chronic myeloproliferative disease: Secondary | ICD-10-CM

## 2023-10-05 DIAGNOSIS — N1832 Chronic kidney disease, stage 3b: Secondary | ICD-10-CM

## 2023-10-05 DIAGNOSIS — Z79899 Other long term (current) drug therapy: Secondary | ICD-10-CM | POA: Diagnosis not present

## 2023-10-05 DIAGNOSIS — N184 Chronic kidney disease, stage 4 (severe): Secondary | ICD-10-CM | POA: Diagnosis not present

## 2023-10-05 DIAGNOSIS — E876 Hypokalemia: Secondary | ICD-10-CM | POA: Diagnosis not present

## 2023-10-05 DIAGNOSIS — D631 Anemia in chronic kidney disease: Secondary | ICD-10-CM | POA: Diagnosis not present

## 2023-10-05 LAB — CBC
HCT: 29.2 % — ABNORMAL LOW (ref 36.0–46.0)
Hemoglobin: 9.3 g/dL — ABNORMAL LOW (ref 12.0–15.0)
MCH: 30.7 pg (ref 26.0–34.0)
MCHC: 31.8 g/dL (ref 30.0–36.0)
MCV: 96.4 fL (ref 80.0–100.0)
Platelets: 395 K/uL (ref 150–400)
RBC: 3.03 MIL/uL — ABNORMAL LOW (ref 3.87–5.11)
RDW: 24 % — ABNORMAL HIGH (ref 11.5–15.5)
WBC: 24.4 K/uL — ABNORMAL HIGH (ref 4.0–10.5)
nRBC: 1.9 % — ABNORMAL HIGH (ref 0.0–0.2)

## 2023-10-05 LAB — SAMPLE TO BLOOD BANK

## 2023-10-05 MED ORDER — DARBEPOETIN ALFA 150 MCG/0.3ML IJ SOSY
150.0000 ug | PREFILLED_SYRINGE | Freq: Once | INTRAMUSCULAR | Status: AC
  Administered 2023-10-05: 150 ug via SUBCUTANEOUS
  Filled 2023-10-05: qty 0.3

## 2023-10-05 NOTE — Progress Notes (Signed)
 Renee Waters presents today for injection per the provider's orders. HGB 9.3 today. Aranesp  administration without incident; injection site WNL; see MAR for injection details.  Patient tolerated procedure well and without incident.  No questions or complaints noted at this time.

## 2023-10-05 NOTE — Progress Notes (Signed)
 Patient tolerated injection with no complaints voiced.  Site clean and dry with no bruising or swelling noted at site.  See MAR for details.  Band aid applied.  Patient stable during and after injection.  Vss with discharge and left in satisfactory condition with no s/s of distress noted.

## 2023-10-11 ENCOUNTER — Encounter: Payer: Self-pay | Admitting: Internal Medicine

## 2023-10-13 ENCOUNTER — Encounter: Payer: Self-pay | Admitting: Family Medicine

## 2023-10-13 ENCOUNTER — Ambulatory Visit (INDEPENDENT_AMBULATORY_CARE_PROVIDER_SITE_OTHER): Admitting: Family Medicine

## 2023-10-13 VITALS — BP 119/70 | Resp 16 | Ht 65.0 in | Wt 124.1 lb

## 2023-10-13 DIAGNOSIS — I10 Essential (primary) hypertension: Secondary | ICD-10-CM | POA: Diagnosis not present

## 2023-10-13 DIAGNOSIS — I5032 Chronic diastolic (congestive) heart failure: Secondary | ICD-10-CM | POA: Diagnosis not present

## 2023-10-13 DIAGNOSIS — D471 Chronic myeloproliferative disease: Secondary | ICD-10-CM | POA: Diagnosis not present

## 2023-10-13 DIAGNOSIS — F324 Major depressive disorder, single episode, in partial remission: Secondary | ICD-10-CM

## 2023-10-13 DIAGNOSIS — Z23 Encounter for immunization: Secondary | ICD-10-CM

## 2023-10-13 DIAGNOSIS — R52 Pain, unspecified: Secondary | ICD-10-CM

## 2023-10-13 MED ORDER — UNABLE TO FIND: 0 refills | Status: AC

## 2023-10-13 NOTE — Patient Instructions (Addendum)
 F/U in January  Flu vaccine today  Script is provided for Covid vaccine , pls do not get this until next week  Hearing testing can be done at either of the 2 offices in [place], just make an appointment, [facility] or [facility]   I will message Oncology, regarding your concern re the aranesp  dose and possibly making you feel weak and drained, hopefully this is not an expected side effect and if it is and if you are able to tolerate it with the desired good effect, then I encourage you to t keep on pushing forward  Please start reading more or listening to books on tape and family will arrange to get you in your garden safely so that you can have the joy of planting and watching you seedlings grow!  I recommend your daughter reach out to Pain clinic so that she can speak with them and arrange an appt time that works for you  HAPPY 78th  Birthday in November!!  Thanks for choosing [facility], we consider it a privelige to serve you.

## 2023-10-15 MED ORDER — VOLTAREN 1 % EX GEL: 2.0000 g | Freq: Four times a day (QID) | CUTANEOUS | 1 refills | Status: AC

## 2023-10-16 DIAGNOSIS — Z23 Encounter for immunization: Secondary | ICD-10-CM | POA: Insufficient documentation

## 2023-10-16 MED ORDER — MIRTAZAPINE 15 MG PO TABS: 15.0000 mg | ORAL_TABLET | Freq: Every day | ORAL | 5 refills | Status: AC

## 2023-10-16 NOTE — Assessment & Plan Note (Addendum)
 Uncontrolled awaiting appt with pain management, is  being treated   by hospice  Voltaren   topical is prescribed

## 2023-10-16 NOTE — Assessment & Plan Note (Signed)
Actively being treated by Oncology

## 2023-10-16 NOTE — Assessment & Plan Note (Signed)
 Controlled, no change in medication

## 2023-10-16 NOTE — Progress Notes (Signed)
   Renee Waters     MRN: [id number]      DOB: [birth year]  Chief Complaint  Patient presents with   Medical Management of Chronic Issues    3 month follow up    Memory Loss    Pt complains of memory loss and hearing loss that is getting worse     HPI Renee Waters is here for follow up and re-evaluation of chronic medical conditions, medication management and review of any available recent lab and radiology data.  Preventive health is updated, specifically   Immunization.   Questions or concerns regarding consultations or procedures which the PT has had in the interim are  addressed. The PT feels that the higher dose of aranesp  has caused increased fatigue and is concerned about this, she is aware that her blood count has improved Had a wonderful holiday experience with her children, now the low following this  C/o chronic uncontrolled pain, waiting on  appt with pain management and hospice is arranging this,daughter will also reach out to establish when an appointment can be set C/o memory loss and hearing loss both worsening States today and this week have not ' been good Depression and anxiety screens are both good  ROS Denies recent fever or chills. Denies sinus pressure, nasal congestion, ear pain or sore throat. Denies chest congestion, productive cough or wheezing. Denies chest pains, palpitations and leg swelling Denies abdominal pain, nausea, vomiting,diarrhea or constipation.   Denies dysuria, frequency, hesitancy or incontinence. C/o uncontrolled  joint pain,  and limitation in mobility. Denies headaches, seizures, numbness, or tingling. Denies depression, anxiety or insomnia. Denies skin break down or rash.   PE  BP 119/70   Resp 16   Ht 5' 5 (1.651 m)   Wt 124 lb 1.9 oz (56.3 kg)   SpO2 96%   BMI 20.65 kg/m   Patient alert and oriented and in no cardiopulmonary distress  HEENT: No facial asymmetry, EOMI,     Neck supple .  Chest: Clear to auscultation  bilaterally.  CVS: S1, S2 no murmurs, no S3.Regular rate.  ABD: Soft non tender.   Ext: No edema  MS: decreased  ROM spine, shoulders, hips and knees.  Skin: Intact, no ulcerations or rash noted.  Psych: Good eye contact,  Memory intact mildly  anxious not  depressed appearing.  CNS: CN 2-12 intact, power,  normal throughout.no focal deficits noted.   Assessment & Plan  Depression, major, single episode, in partial remission (HCC) Controlled, no change in medication   Essential hypertension Controlled, no change in medication   Immunization due After obtaining informed consent, the influenza vaccine is  administered , with no adverse effect noted at the time of administration.   MPN (myeloproliferative neoplasm) (HCC) Actively being treated by Oncology  Generalized pain Uncontrolled awaiting appt with pain management, is  being treated   by hospice  Voltaren   topical is prescribed   Chronic diastolic heart failure (HCC) Stable no s/s of decompensation

## 2023-10-16 NOTE — Assessment & Plan Note (Signed)
Stable no s/s of decompensation

## 2023-10-16 NOTE — Assessment & Plan Note (Signed)
 After obtaining informed consent, the influenza vaccine is  administered , with no adverse effect noted at the time of administration.

## 2023-10-19 ENCOUNTER — Inpatient Hospital Stay

## 2023-10-19 ENCOUNTER — Inpatient Hospital Stay: Attending: Hematology

## 2023-10-19 VITALS — BP 104/65 | HR 59 | Temp 97.4°F | Resp 18

## 2023-10-19 DIAGNOSIS — D649 Anemia, unspecified: Secondary | ICD-10-CM

## 2023-10-19 DIAGNOSIS — N184 Chronic kidney disease, stage 4 (severe): Secondary | ICD-10-CM | POA: Diagnosis present

## 2023-10-19 DIAGNOSIS — D631 Anemia in chronic kidney disease: Secondary | ICD-10-CM | POA: Diagnosis present

## 2023-10-19 DIAGNOSIS — N1832 Chronic kidney disease, stage 3b: Secondary | ICD-10-CM

## 2023-10-19 DIAGNOSIS — D471 Chronic myeloproliferative disease: Secondary | ICD-10-CM

## 2023-10-19 LAB — COMPREHENSIVE METABOLIC PANEL WITH GFR
ALT: 19 U/L (ref 0–44)
AST: 36 U/L (ref 15–41)
Albumin: 3.5 g/dL (ref 3.5–5.0)
Alkaline Phosphatase: 77 U/L (ref 38–126)
Anion gap: 10 (ref 5–15)
BUN: 37 mg/dL — ABNORMAL HIGH (ref 8–23)
CO2: 20 mmol/L — ABNORMAL LOW (ref 22–32)
Calcium: 8.1 mg/dL — ABNORMAL LOW (ref 8.9–10.3)
Chloride: 110 mmol/L (ref 98–111)
Creatinine, Ser: 3.39 mg/dL — ABNORMAL HIGH (ref 0.44–1.00)
GFR, Estimated: 13 mL/min — ABNORMAL LOW (ref >60)
Glucose, Bld: 89 mg/dL (ref 70–99)
Potassium: 3.7 mmol/L (ref 3.5–5.1)
Sodium: 140 mmol/L (ref 135–145)
Total Bilirubin: 0.6 mg/dL (ref 0.0–1.2)
Total Protein: 7.5 g/dL (ref 6.5–8.1)

## 2023-10-19 LAB — CBC
HCT: 25.6 % — ABNORMAL LOW (ref 36.0–46.0)
Hemoglobin: 7.8 g/dL — ABNORMAL LOW (ref 12.0–15.0)
MCH: 29.5 pg (ref 26.0–34.0)
MCHC: 30.5 g/dL (ref 30.0–36.0)
MCV: 97 fL (ref 80.0–100.0)
Platelets: 395 K/uL (ref 150–400)
RBC: 2.64 MIL/uL — ABNORMAL LOW (ref 3.87–5.11)
RDW: 23.6 % — ABNORMAL HIGH (ref 11.5–15.5)
WBC: 37 K/uL — ABNORMAL HIGH (ref 4.0–10.5)
nRBC: 2.9 % — ABNORMAL HIGH (ref 0.0–0.2)

## 2023-10-19 LAB — SAMPLE TO BLOOD BANK

## 2023-10-19 MED ORDER — DARBEPOETIN ALFA 150 MCG/0.3ML IJ SOSY
150.0000 ug | PREFILLED_SYRINGE | Freq: Once | INTRAMUSCULAR | Status: AC
  Administered 2023-10-19: 150 ug via SUBCUTANEOUS
  Filled 2023-10-19: qty 0.3

## 2023-10-19 NOTE — Progress Notes (Signed)
 Patient presents today for Aranesp  injection per providers order.  Vital signs WNL.  Hgb noted to be 7.8, patient asymptomatic, MD notified.  Patient has no complaints at this time.  Per MD,give Aranesp , and no blood at this time.  Patient to call clinic if she becomes symptomatic.  Patient expressed understanding.  Stable during administration without incident; injection site WNL; see MAR for injection details.  Patient tolerated procedure well and without incident.  No questions or complaints noted at this time.

## 2023-10-19 NOTE — Progress Notes (Signed)
 Patients port flushed without difficulty.  Good blood return noted with no bruising or swelling noted at site.  Band aid applied.  VSS with discharge and left in satisfactory condition with no s/s of distress noted.

## 2023-10-19 NOTE — Patient Instructions (Signed)
 [facility] [place] [facility]  Discharge Instructions: Thank you for choosing [facility] to provide your oncology and hematology care.  If you have a lab appointment with the Cancer Center - please note that after April 8th, 2024, all labs will be drawn in the cancer center.  You do not have to check in or register with the main entrance as you have in the past but will complete your check-in in the cancer center.  Wear comfortable clothing and clothing appropriate for easy access to any Portacath or PICC line.   We strive to give you quality time with your provider. You may need to reschedule your appointment if you arrive late (15 or more minutes).  Arriving late affects you and other patients whose appointments are after yours.  Also, if you miss three or more appointments without notifying the office, you may be dismissed from the clinic at the provider's discretion.      For prescription refill requests, have your pharmacy contact our office and allow 72 hours for refills to be completed.    Today you received the following chemotherapy and/or immunotherapy agents aranesp.       To help prevent nausea and vomiting after your treatment, we encourage you to take your nausea medication as directed.  BELOW ARE SYMPTOMS THAT SHOULD BE REPORTED IMMEDIATELY: *FEVER GREATER THAN 100.4 F (38 C) OR HIGHER *CHILLS OR SWEATING *NAUSEA AND VOMITING THAT IS NOT CONTROLLED WITH YOUR NAUSEA MEDICATION *UNUSUAL SHORTNESS OF BREATH *UNUSUAL BRUISING OR BLEEDING *URINARY PROBLEMS (pain or burning when urinating, or frequent urination) *BOWEL PROBLEMS (unusual diarrhea, constipation, pain near the anus) TENDERNESS IN MOUTH AND THROAT WITH OR WITHOUT PRESENCE OF ULCERS (sore throat, sores in mouth, or a toothache) UNUSUAL RASH, SWELLING OR PAIN  UNUSUAL VAGINAL DISCHARGE OR ITCHING   Items with * indicate a potential emergency and should be followed up  as soon as possible or go to the Emergency Department if any problems should occur.  Please show the CHEMOTHERAPY ALERT CARD or IMMUNOTHERAPY ALERT CARD at check-in to the Emergency Department and triage nurse.  Should you have questions after your visit or need to cancel or reschedule your appointment, please contact [facility] [place] - A DEPT OF [facility] [phone]  and follow the prompts.  Office hours are 8:00 a.m. to 4:30 p.m. Monday - Friday. Please note that voicemails left after 4:00 p.m. may not be returned until the following business day.  We are closed weekends and major holidays. You have access to a nurse at all times for urgent questions. Please call the main number to the clinic [phone] and follow the prompts.  For any non-urgent questions, you may also contact your provider using MyChart. We now offer e-Visits for anyone 64 and older to request care online for non-urgent symptoms. For details visit [email or web address].   Also download the MyChart app! Go to the app store, search "MyChart", open the app, select [place], and log in with your MyChart username and password.

## 2023-10-24 ENCOUNTER — Other Ambulatory Visit: Payer: Self-pay | Admitting: Oncology

## 2023-10-24 DIAGNOSIS — F321 Major depressive disorder, single episode, moderate: Secondary | ICD-10-CM | POA: Diagnosis not present

## 2023-10-24 DIAGNOSIS — F411 Generalized anxiety disorder: Secondary | ICD-10-CM | POA: Diagnosis not present

## 2023-10-27 ENCOUNTER — Other Ambulatory Visit (HOSPITAL_COMMUNITY): Payer: Self-pay

## 2023-10-27 DIAGNOSIS — Z23 Encounter for immunization: Secondary | ICD-10-CM | POA: Diagnosis not present

## 2023-10-31 ENCOUNTER — Other Ambulatory Visit: Payer: Self-pay

## 2023-10-31 ENCOUNTER — Other Ambulatory Visit: Payer: Self-pay | Admitting: Pharmacy Technician

## 2023-10-31 NOTE — Progress Notes (Signed)
 Specialty Pharmacy Refill Coordination Note  Renee Waters is a 78 y.o. female contacted today regarding refills of specialty medication(s) Momelotinib Dihydrochloride  (OJJAARA )   Patient requested Delivery   Delivery date: 11/02/23   Verified address: [address]  [place] [address]   Medication will be filled on 11/01/23.  Spoke to patient's spouse Loius.

## 2023-11-01 DIAGNOSIS — Z515 Encounter for palliative care: Secondary | ICD-10-CM | POA: Diagnosis not present

## 2023-11-01 DIAGNOSIS — D47Z9 Other specified neoplasms of uncertain behavior of lymphoid, hematopoietic and related tissue: Secondary | ICD-10-CM | POA: Diagnosis not present

## 2023-11-02 ENCOUNTER — Inpatient Hospital Stay

## 2023-11-02 ENCOUNTER — Inpatient Hospital Stay (HOSPITAL_BASED_OUTPATIENT_CLINIC_OR_DEPARTMENT_OTHER): Admitting: Oncology

## 2023-11-02 VITALS — BP 156/70 | HR 63 | Temp 97.0°F | Resp 17

## 2023-11-02 VITALS — BP 147/69 | Wt 126.0 lb

## 2023-11-02 DIAGNOSIS — D471 Chronic myeloproliferative disease: Secondary | ICD-10-CM | POA: Diagnosis not present

## 2023-11-02 DIAGNOSIS — R161 Splenomegaly, not elsewhere classified: Secondary | ICD-10-CM

## 2023-11-02 DIAGNOSIS — N1832 Chronic kidney disease, stage 3b: Secondary | ICD-10-CM

## 2023-11-02 DIAGNOSIS — D649 Anemia, unspecified: Secondary | ICD-10-CM

## 2023-11-02 DIAGNOSIS — D631 Anemia in chronic kidney disease: Secondary | ICD-10-CM | POA: Diagnosis not present

## 2023-11-02 DIAGNOSIS — N184 Chronic kidney disease, stage 4 (severe): Secondary | ICD-10-CM | POA: Diagnosis not present

## 2023-11-02 LAB — CBC WITH DIFFERENTIAL/PLATELET
Abs Immature Granulocytes: 3.06 K/uL — ABNORMAL HIGH (ref 0.00–0.07)
Basophils Absolute: 0.3 K/uL — ABNORMAL HIGH (ref 0.0–0.1)
Basophils Relative: 1 %
Eosinophils Absolute: 0.2 K/uL (ref 0.0–0.5)
Eosinophils Relative: 1 %
HCT: 25.1 % — ABNORMAL LOW (ref 36.0–46.0)
Hemoglobin: 7.8 g/dL — ABNORMAL LOW (ref 12.0–15.0)
Immature Granulocytes: 9 %
Lymphocytes Relative: 16 %
Lymphs Abs: 5.3 K/uL — ABNORMAL HIGH (ref 0.7–4.0)
MCH: 29.8 pg (ref 26.0–34.0)
MCHC: 31.1 g/dL (ref 30.0–36.0)
MCV: 95.8 fL (ref 80.0–100.0)
Monocytes Absolute: 10.6 K/uL — ABNORMAL HIGH (ref 0.1–1.0)
Monocytes Relative: 33 %
Neutro Abs: 13.1 K/uL — ABNORMAL HIGH (ref 1.7–7.7)
Neutrophils Relative %: 40 %
Platelets: 400 K/uL (ref 150–400)
RBC: 2.62 MIL/uL — ABNORMAL LOW (ref 3.87–5.11)
RDW: 22.8 % — ABNORMAL HIGH (ref 11.5–15.5)
WBC: 32.6 K/uL — ABNORMAL HIGH (ref 4.0–10.5)
nRBC: 4.5 % — ABNORMAL HIGH (ref 0.0–0.2)

## 2023-11-02 LAB — SAMPLE TO BLOOD BANK

## 2023-11-02 MED ORDER — DARBEPOETIN ALFA 150 MCG/0.3ML IJ SOSY
150.0000 ug | PREFILLED_SYRINGE | Freq: Once | INTRAMUSCULAR | Status: AC
  Filled 2023-11-02: qty 0.3

## 2023-11-02 NOTE — Progress Notes (Unsigned)
 [facility] OFFICE PROGRESS NOTE  Renee Rollene BRAVO, MD  ASSESSMENT & PLAN:    Assessment & Plan Myeloproliferative neoplasm [facility]) - She is tolerating momelotinib 100 mg daily reasonably well. - Physical exam: Spleen tip palpable on deep inspiration. - Ultrasound spleen (07/17/2022): Spleen volume is 284 mL, slightly increased from to 40 mL previously. - Labs today: White count ranging between 13-37k.  White count improved some and is 32.6.  Platelet count is 400.  LFTs were normal. - Continue momelotinib 100 mg daily.  She could not tolerate higher dose in the past. - Recommend follow-up in 12 weeks.  Repeat ultrasound spleen in December. Anemia, unspecified type - Anemia from CKD and functional iron deficiency and MPN. - We started her on Aranesp  100 mcg every 2 weeks on 07/27/2023 and was increased 250 mcg every 2 weeks on 09/07/2023.   -Since starting Aranesp , hemoglobin initially increased but has slowly declined.  Hemoglobin today on 11/02/2023 is 7.8. -Will discuss with pharmacy as well as MD with regards to continuing Aranesp  or switching to Retacrit.  Patient would like to hold off on injection today and repeat labs in a week to see what her hemoglobin does.  Daughter reports that her mother is very sensitive to medications and feels like often her body will do the opposite of what it is anticipated.  Her latest ferritin is 267 and saturation is 33 on 07/27/2023.  Will repeat ferritin and iron panel at next visit. Splenomegaly Repeat ultrasound per above.  Orders Placed This Encounter  Procedures   US  SPLEEN (ABDOMEN LIMITED)    Standing Status:   Future    Expected Date:   02/01/2024    Expiration Date:   11/01/2024    Reason for Exam (SYMPTOM  OR DIAGNOSIS REQUIRED):   spleenomegaly    Preferred imaging location?:   [facility]    Release to patient:   Immediate   Ferritin    Standing Status:   Future    Expected Date:   11/10/2023    Expiration  Date:   02/08/2024   Iron and TIBC ([facility] ONLY)    Standing Status:   Future    Expected Date:   11/10/2023    Expiration Date:   02/08/2024   Lactate dehydrogenase    Standing Status:   Future    Expected Date:   11/10/2023    Expiration Date:   02/08/2024   Comprehensive metabolic panel    Standing Status:   Future    Expected Date:   11/10/2023    Expiration Date:   02/08/2024   CBC with Differential    Standing Status:   Future    Expected Date:   11/10/2023    Expiration Date:   11/02/2024    INTERVAL HISTORY: Patient returns for follow-up.  Patient presents today with her daughter.  Reports she has not been feeling well and feels significantly fatigued.  Appetite and energy levels are 50%.  She has chronic right hip and leg pain.  Has chronic stable cough and shortness of breath.  Has occasional nausea.  She continues to tolerate Ojjaara  reasonably well at lower dose.  Denies any infections or recent fevers.  Patient and daughter have questions regarding drop in hemoglobin recently.  We reviewed CBC, CMP  SUMMARY OF HEMATOLOGIC HISTORY: Oncology History Overview Note  Assessment: 1. MPL positive prefibrotic/early primary myelofibrosis: - CBC on 11/06/2020 with white count 75.6, differential 59% neutrophils, 12% monocytes, 4%  lymphocytes, 1 percentage of eosinophils and basophils, 6% band neutrophils, 12% myelocytes, 1% promyelocytes, 29% blasts. - Pathologist review of blood smear reported as leukoerythroblastic reaction. - 25 pound weight loss in the last 6 months, unintentional.  Decreased appetite.  Reports fatigue for the last few months.  Reports night sweats x3 in the last 1 month. - Bone marrow biopsy on 11/18/2020 consistent with granulocytic proliferation with differential including CMML versus myeloproliferative disorder. - BCR/ABL was negative. 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 - NGS myeloid panel shows mutations in ASXL1, MPL,  TET2, EZH2 mutations. - Spleen ultrasound on 12/16/2020 shows mildly enlarged measuring 12.4 x 9.6 x 7 cm with volume of 435 cc. - PDGFR alpha, beta and FGFR 1 was negative. - She was evaluated by Dr. Perri at [facility].  Slides were reviewed at [facility] hematopathology.  They thought it was less likely CMML and more likely MPL positive myeloproliferative neoplasm.  Dr. Perri has recommended initiate Jakafi . - Given anemia, B symptoms, cytogenetics with MPL, ASLX1, normal karyotype she is intermediate risk on GIPPS at high risk MIPSS70+ - Ruxolitinib  10 mg twice daily started on 02/16/2021.  Dose increased to 15 mg twice daily on 05/18/2021. - CTAP on 04/07/2021: Hepatomegaly and splenomegaly measuring 14.3 cm.  No other pathology.  No spleen infarcts. -Ruxolitinib  dose increased to 15 mg twice daily on 09/20/2021. - Ultrasound spleen (09/21/2021): 13.6 cm with volume 532 mL.  This is slightly improved from previous volume. - Bone marrow biopsy (09/09/2021): Material is limited but overall features consistent with myeloid neoplasm.  No increase in blasts.  Erythroid precursors appear decreased but megakaryocytes are variably evident with clusters and small forms.  Reticulin's shows variable increase in reticulin fibers including areas with prominent increase.  Chromosome analysis was normal. - Tapering doses of ruxolitinib  started on 12/24/2021, last dose on 01/23/2022 - Evaluated by Dr. Perri on 12/24/2021. - Momelotinib 100 mg daily started on 01/26/2022, dose increased to 150 mg daily on 07/19/2022 due to generalized itching.  Dose decreased to 100 mg daily on 08/09/2022.  2.  Social/family history: - She lives at home and is able to do all her ADLs and IADLs although she is getting tired lately.  She reports quitting smoking 6 months ago and smoked 1 pack/week for 43 years. - She believes that her mother had some kind of leukemia.  No other malignancies.    No history exists.      CBC    Component Value Date/Time   WBC 32.6 (H) 11/02/2023 1407   RBC 2.62 (L) 11/02/2023 1407   HGB 7.8 (L) 11/02/2023 1407   HGB 11.9 05/27/2020 1632   HCT 25.1 (L) 11/02/2023 1407   HCT 35.3 05/27/2020 1632   PLT 400 11/02/2023 1407   PLT 412 05/27/2020 1632   MCV 95.8 11/02/2023 1407   MCV 91 05/27/2020 1632   MCH 29.8 11/02/2023 1407   MCHC 31.1 11/02/2023 1407   RDW 22.8 (H) 11/02/2023 1407   RDW 14.1 05/27/2020 1632   LYMPHSABS 5.3 (H) 11/02/2023 1407   LYMPHSABS 1.7 05/27/2020 1632   MONOABS 10.6 (H) 11/02/2023 1407   EOSABS 0.2 11/02/2023 1407   EOSABS 0.2 05/27/2020 1632   BASOSABS 0.3 (H) 11/02/2023 1407   BASOSABS 0.0 05/27/2020 1632       Latest Ref Rng & Units 10/19/2023    2:23 PM 08/24/2023    1:40 PM 08/10/2023   12:21 PM  CMP  Glucose 70 -  99 mg/dL 89  98  881   BUN 8 - 23 mg/dL 37  29  29   Creatinine 0.44 - 1.00 mg/dL 6.60  7.77  8.13   Sodium 135 - 145 mmol/L 140  139  138   Potassium 3.5 - 5.1 mmol/L 3.7  3.9  4.1   Chloride 98 - 111 mmol/L 110  112  111   CO2 22 - 32 mmol/L 20  18  18    Calcium  8.9 - 10.3 mg/dL 8.1  8.6  8.3   Total Protein 6.5 - 8.1 g/dL 7.5  7.5  7.9   Total Bilirubin 0.0 - 1.2 mg/dL 0.6  0.5  0.7   Alkaline Phos 38 - 126 U/L 77  74  75   AST 15 - 41 U/L 36  29  21   ALT 0 - 44 U/L 19  22  12       Lab Results  Component Value Date   FERRITIN 267 07/27/2023   VITAMINB12 1,175 (H) 07/22/2022    Vitals:   11/02/23 1435  BP: (!) 147/69    Review of System:  Review of Systems  Constitutional:  Positive for malaise/fatigue.  Respiratory:  Positive for cough and shortness of breath.   Gastrointestinal:  Positive for nausea.  Musculoskeletal:  Positive for joint pain.  Psychiatric/Behavioral:  The patient has insomnia.     Physical Exam: Physical Exam Constitutional:      Appearance: Normal appearance.  HENT:     Head: Normocephalic and atraumatic.  Eyes:     Pupils: Pupils are equal, round, and reactive to  light.  Cardiovascular:     Rate and Rhythm: Normal rate and regular rhythm.     Heart sounds: Normal heart sounds. No murmur heard. Pulmonary:     Effort: Pulmonary effort is normal.     Breath sounds: Normal breath sounds. No wheezing.  Abdominal:     General: Bowel sounds are normal. There is no distension.     Palpations: Abdomen is soft. There is splenomegaly.     Tenderness: There is no abdominal tenderness.  Musculoskeletal:        General: Normal range of motion.     Cervical back: Normal range of motion.  Skin:    General: Skin is warm and dry.     Findings: No rash.  Neurological:     Mental Status: She is alert and oriented to person, place, and time.     Gait: Gait is intact.  Psychiatric:        Mood and Affect: Mood and affect normal.        Cognition and Memory: Memory normal.        Judgment: Judgment normal.      I spent 25 minutes dedicated to the care of this patient (face-to-face and non-face-to-face) on the date of the encounter to include what is described in the assessment and plan.,  Delon Hope, NP 11/03/2023 2:45 PM

## 2023-11-02 NOTE — Assessment & Plan Note (Addendum)
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 - We started her on Aranesp  100 mcg every 2 weeks on 07/27/2023.  Hemoglobin today improved to 8.8.  As there is very minimal improvement, I have recommended increasing Aranesp  to 150 mcg every 2 weeks.  Her latest ferritin is 267 and saturation is 33 on 07/27/2023.  Will repeat ferritin and iron panel at next visit.

## 2023-11-02 NOTE — Assessment & Plan Note (Addendum)
-   She is tolerating momelotinib 100 mg daily reasonably well. - Physical exam: Spleen tip palpable on deep inspiration. - Ultrasound spleen (07/17/2022): Spleen volume is 284 mL, slightly increased from to 40 mL previously. - Labs today: White count ranging between 13-37k.  White count improved some and is 32.6.  Platelet count is 400.  LFTs were normal. - Continue momelotinib 100 mg daily.  She could not tolerate higher dose in the past. - Recommend follow-up in 12 weeks.  Repeat ultrasound spleen in December.

## 2023-11-02 NOTE — Progress Notes (Unsigned)
 [facility] presents today for Aranesp  injection per the provider's orders.  *** administration without incident; injection site WNL; see MAR for injection details.  Patient tolerated procedure well and without incident.  No questions or complaints noted at this time.

## 2023-11-02 NOTE — Progress Notes (Unsigned)
 No injection or blood needed today per Delon Hope NP.

## 2023-11-02 NOTE — Patient Instructions (Signed)
 [facility] [place] - A DEPT OF [facility]  Discharge Instructions: Thank you for choosing [facility] to provide your oncology and hematology care.  If you have a lab appointment with the Cancer Center - please note that after April 8th, 2024, all labs will be drawn in the cancer center.  You do not have to check in or register with the main entrance as you have in the past but will complete your check-in in the cancer center.  Wear comfortable clothing and clothing appropriate for easy access to any Portacath or PICC line.   We strive to give you quality time with your provider. You may need to reschedule your appointment if you arrive late (15 or more minutes).  Arriving late affects you and other patients whose appointments are after yours.  Also, if you miss three or more appointments without notifying the office, you may be dismissed from the clinic at the provider's discretion.      For prescription refill requests, have your pharmacy contact our office and allow 72 hours for refills to be completed.    Today you received the following chemotherapy and/or immunotherapy agents Aranesp       To help prevent nausea and vomiting after your treatment, we encourage you to take your nausea medication as directed.  BELOW ARE SYMPTOMS THAT SHOULD BE REPORTED IMMEDIATELY: *FEVER GREATER THAN 100.4 F (38 C) OR HIGHER *CHILLS OR SWEATING *NAUSEA AND VOMITING THAT IS NOT CONTROLLED WITH YOUR NAUSEA MEDICATION *UNUSUAL SHORTNESS OF BREATH *UNUSUAL BRUISING OR BLEEDING *URINARY PROBLEMS (pain or burning when urinating, or frequent urination) *BOWEL PROBLEMS (unusual diarrhea, constipation, pain near the anus) TENDERNESS IN MOUTH AND THROAT WITH OR WITHOUT PRESENCE OF ULCERS (sore throat, sores in mouth, or a toothache) UNUSUAL RASH, SWELLING OR PAIN  UNUSUAL VAGINAL DISCHARGE OR ITCHING   Items with * indicate a potential emergency and should be followed up  as soon as possible or go to the Emergency Department if any problems should occur.  Please show the CHEMOTHERAPY ALERT CARD or IMMUNOTHERAPY ALERT CARD at check-in to the Emergency Department and triage nurse.  Should you have questions after your visit or need to cancel or reschedule your appointment, please contact [facility]  - A DEPT OF [facility] [phone]  and follow the prompts.  Office hours are 8:00 a.m. to 4:30 p.m. Monday - Friday. Please note that voicemails left after 4:00 p.m. may not be returned until the following business day.  We are closed weekends and major holidays. You have access to a nurse at all times for urgent questions. Please call the main number to the clinic [phone] and follow the prompts.  For any non-urgent questions, you may also contact your provider using MyChart. We now offer e-Visits for anyone 25 and older to request care online for non-urgent symptoms. For details visit [email or web address].   Also download the MyChart app! Go to the app store, search MyChart, open the app, select [place], and log in with your MyChart username and password.

## 2023-11-03 ENCOUNTER — Encounter: Payer: Self-pay | Admitting: Oncology

## 2023-11-06 ENCOUNTER — Other Ambulatory Visit: Payer: Self-pay | Admitting: Family Medicine

## 2023-11-09 ENCOUNTER — Inpatient Hospital Stay

## 2023-11-09 ENCOUNTER — Inpatient Hospital Stay: Attending: Hematology | Admitting: Oncology

## 2023-11-09 VITALS — Ht 65.0 in | Wt 121.0 lb

## 2023-11-09 DIAGNOSIS — D471 Chronic myeloproliferative disease: Secondary | ICD-10-CM | POA: Insufficient documentation

## 2023-11-09 DIAGNOSIS — N184 Chronic kidney disease, stage 4 (severe): Secondary | ICD-10-CM | POA: Insufficient documentation

## 2023-11-09 DIAGNOSIS — D649 Anemia, unspecified: Secondary | ICD-10-CM

## 2023-11-09 DIAGNOSIS — E611 Iron deficiency: Secondary | ICD-10-CM | POA: Insufficient documentation

## 2023-11-09 DIAGNOSIS — R161 Splenomegaly, not elsewhere classified: Secondary | ICD-10-CM

## 2023-11-09 DIAGNOSIS — D631 Anemia in chronic kidney disease: Secondary | ICD-10-CM | POA: Diagnosis not present

## 2023-11-09 DIAGNOSIS — Z79899 Other long term (current) drug therapy: Secondary | ICD-10-CM | POA: Diagnosis not present

## 2023-11-09 LAB — IRON AND TIBC
Iron: 128 ug/dL (ref 28–170)
Saturation Ratios: 56 % — ABNORMAL HIGH (ref 10.4–31.8)
TIBC: 228 ug/dL — ABNORMAL LOW (ref 250–450)
UIBC: 100 ug/dL

## 2023-11-09 LAB — CBC WITH DIFFERENTIAL/PLATELET
Abs Immature Granulocytes: 4.79 K/uL — ABNORMAL HIGH (ref 0.00–0.07)
Basophils Absolute: 0.3 K/uL — ABNORMAL HIGH (ref 0.0–0.1)
Basophils Relative: 1 %
Eosinophils Absolute: 0.2 K/uL (ref 0.0–0.5)
Eosinophils Relative: 0 %
HCT: 24 % — ABNORMAL LOW (ref 36.0–46.0)
Hemoglobin: 7.3 g/dL — ABNORMAL LOW (ref 12.0–15.0)
Immature Granulocytes: 12 %
Lymphocytes Relative: 13 %
Lymphs Abs: 5.4 K/uL — ABNORMAL HIGH (ref 0.7–4.0)
MCH: 29.1 pg (ref 26.0–34.0)
MCHC: 30.4 g/dL (ref 30.0–36.0)
MCV: 95.6 fL (ref 80.0–100.0)
Monocytes Absolute: 16 K/uL — ABNORMAL HIGH (ref 0.1–1.0)
Monocytes Relative: 39 %
Neutro Abs: 14.4 K/uL — ABNORMAL HIGH (ref 1.7–7.7)
Neutrophils Relative %: 35 %
Platelets: 339 K/uL (ref 150–400)
RBC: 2.51 MIL/uL — ABNORMAL LOW (ref 3.87–5.11)
RDW: 23.2 % — ABNORMAL HIGH (ref 11.5–15.5)
WBC: 41.1 K/uL — ABNORMAL HIGH (ref 4.0–10.5)
nRBC: 3.1 % — ABNORMAL HIGH (ref 0.0–0.2)

## 2023-11-09 LAB — FERRITIN: Ferritin: 310 ng/mL — ABNORMAL HIGH (ref 11–307)

## 2023-11-09 LAB — BPAM RBC
Blood Product Expiration Date: 202510202359
Unit Type and Rh: 5100

## 2023-11-09 LAB — LACTATE DEHYDROGENASE: LDH: 956 U/L — ABNORMAL HIGH (ref 98–192)

## 2023-11-09 LAB — COMPREHENSIVE METABOLIC PANEL WITH GFR
ALT: 11 U/L (ref 0–44)
AST: 37 U/L (ref 15–41)
Albumin: 3.9 g/dL (ref 3.5–5.0)
Alkaline Phosphatase: 94 U/L (ref 38–126)
Anion gap: 11 (ref 5–15)
BUN: 28 mg/dL — ABNORMAL HIGH (ref 8–23)
CO2: 21 mmol/L — ABNORMAL LOW (ref 22–32)
Calcium: 8.8 mg/dL — ABNORMAL LOW (ref 8.9–10.3)
Chloride: 105 mmol/L (ref 98–111)
Creatinine, Ser: 2.86 mg/dL — ABNORMAL HIGH (ref 0.44–1.00)
GFR, Estimated: 16 mL/min — ABNORMAL LOW (ref >60)
Glucose, Bld: 96 mg/dL (ref 70–99)
Potassium: 4.1 mmol/L (ref 3.5–5.1)
Sodium: 137 mmol/L (ref 135–145)
Total Bilirubin: 0.5 mg/dL (ref 0.0–1.2)
Total Protein: 8.4 g/dL — ABNORMAL HIGH (ref 6.5–8.1)

## 2023-11-09 LAB — SAMPLE TO BLOOD BANK

## 2023-11-09 LAB — VITAMIN B12: Vitamin B-12: 2676 pg/mL — ABNORMAL HIGH (ref 180–914)

## 2023-11-09 LAB — PREPARE RBC (CROSSMATCH)

## 2023-11-09 LAB — FOLATE: Folate: 18.3 ng/mL (ref >5.9)

## 2023-11-09 NOTE — Progress Notes (Addendum)
 [facility] OFFICE PROGRESS NOTE  Renee Rollene BRAVO, MD  ASSESSMENT & PLAN:    Assessment & Plan Myeloproliferative neoplasm [facility]) - She is tolerating momelotinib 100 mg daily reasonably well. - Ultrasound spleen (07/17/2022): Spleen volume is 284 mL, slightly increased from to 40 mL previously. - Labs today: Show worsening anemia and increasing white blood cell count. -Discussed case with Dr. Davonna who recommends bone marrow biopsy to determine if she has converted from MDS to an acute leukemia. -Unfortunately patient had traumatic experience at [facility] with her last bone marrow biopsy resulting in extensive pain to her right hip and back.  Patient has chronic pain already. -Discussed options with patient and daughter which included repeat bone marrow biopsy at [facility] under twilight sedation versus continue to monitor lab work monthly with transfusions as needed. -Patient would like to think about it and discuss with her family.  -Discussed possible blood transfusion tomorrow given she is fatigued and weekly thereafter with labs 1 unit of blood based on results. Anemia, unspecified type - Anemia from CKD and functional iron deficiency and MPN. - We started her on Aranesp  100 mcg every 2 weeks on 07/27/2023 and was increased 250 mcg every 2 weeks on 09/07/2023.   -Since starting Aranesp , hemoglobin initially increased but has slowly declined.  -Today, hemoglobin has further declined to 7.3.  Repeat bone marrow biopsy per above and weekly lab draw plus or minus PRBC.  Orders Placed This Encounter  Procedures   CT BONE MARROW BIOPSY & ASPIRATION    Standing Status:   Future    Expected Date:   11/10/2023    Expiration Date:   11/08/2024    Reason for Exam (SYMPTOM  OR DIAGNOSIS REQUIRED):   MDS transformation AML    Preferred imaging location?:   [facility]    Radiology Contrast Protocol - do NOT remove file path:    [email or web address]   CBC    Standing Status:   Standing    Number of Occurrences:   6    Expiration Date:   11/08/2024   Sample to Blood Bank(Blood Bank Hold)    Standing Status:   Standing    Number of Occurrences:   6    Next Expected Occurrence:   11/10/2023    Expiration Date:   11/08/2024    INTERVAL HISTORY: Patient returns for follow-up for history of MDS and anemia.  Patient has had an increase in her white blood cell count and decrease in her hemoglobin despite Ojjaara  100 mg tablets daily and Aranesp  every other week.  Case was discussed with Dr. Davonna  who recommended repeat bone marrow biopsy given worsening anemia despite erythropoietin  stimulating agent.  Unfortunately, patient had traumatic experience with her last bone marrow at [facility] and required several months of recovery due to chronic pain worsened by bone marrow biopsy.  Overall, patient feels fairly stable from last week.  Has fatigue and shortness of breath intermittently.  Has occasional nausea.  Appetite and energy levels are 50%.  Has right knee sharp shooting pain.  We reviewed nutritional labs, CBC, CMP and LDH.  SUMMARY OF HEMATOLOGIC HISTORY: Oncology History Overview Note  Assessment: 1. MPL positive prefibrotic/early primary myelofibrosis: - CBC on 11/06/2020 with white count 75.6, differential 59% neutrophils, 12% monocytes, 4% lymphocytes, 1 percentage of eosinophils and basophils, 6% band neutrophils, 12% myelocytes, 1% promyelocytes, 29% blasts. - Pathologist review of blood smear reported as leukoerythroblastic reaction. - 25 pound weight loss  in the last 6 months, unintentional.  Decreased appetite.  Reports fatigue for the last few months.  Reports night sweats x3 in the last 1 month. - Bone marrow biopsy on 11/18/2020 consistent with granulocytic proliferation with differential including CMML versus myeloproliferative disorder. - BCR/ABL was negative. -  JAK2 reflex mutation testing showed positive MPLW  mutation. - NGS myeloid panel shows mutations in ASXL1, MPL, TET2, EZH2 mutations. - Spleen ultrasound on 12/16/2020 shows mildly enlarged measuring 12.4 x 9.6 x 7 cm with volume of 435 cc. - PDGFR alpha, beta and FGFR 1 was negative. - She was evaluated by Dr. Perri at [facility].  Slides were reviewed at [facility] hematopathology.  They thought it was less likely CMML and more likely MPL positive myeloproliferative neoplasm.  Dr. Perri has recommended initiate Jakafi . - Given anemia, B symptoms, cytogenetics with MPL, ASLX1, normal karyotype she is intermediate risk on GIPPS at high risk MIPSS70+ - Ruxolitinib  10 mg twice daily started on 02/16/2021.  Dose increased to 15 mg twice daily on 05/18/2021. - CTAP on 04/07/2021: Hepatomegaly and splenomegaly measuring 14.3 cm.  No other pathology.  No spleen infarcts. -Ruxolitinib  dose increased to 15 mg twice daily on 09/20/2021. - Ultrasound spleen (09/21/2021): 13.6 cm with volume 532 mL.  This is slightly improved from previous volume. - Bone marrow biopsy (09/09/2021): Material is limited but overall features consistent with myeloid neoplasm.  No increase in blasts.  Erythroid precursors appear decreased but megakaryocytes are variably evident with clusters and small forms.  Reticulin's shows variable increase in reticulin fibers including areas with prominent increase.  Chromosome analysis was normal. - Tapering doses of ruxolitinib  started on 12/24/2021, last dose on 01/23/2022 - Evaluated by Dr. Perri on 12/24/2021. - Momelotinib 100 mg daily started on 01/26/2022, dose increased to 150 mg daily on 07/19/2022 due to generalized itching.  Dose decreased to 100 mg daily on 08/09/2022.  2.  Social/family history: - She lives at home and is able to do all her ADLs and IADLs although she is getting tired lately.  She reports quitting smoking 6 months ago and smoked 1 pack/week for 43 years. -  She believes that her mother had some kind of leukemia.  No other malignancies.    No history exists.     CBC    Component Value Date/Time   WBC 41.1 (H) 11/09/2023 1100   RBC 2.51 (L) 11/09/2023 1100   HGB 7.3 (L) 11/09/2023 1100   HGB 11.9 05/27/2020 1632   HCT 24.0 (L) 11/09/2023 1100   HCT 35.3 05/27/2020 1632   PLT 339 11/09/2023 1100   PLT 412 05/27/2020 1632   MCV 95.6 11/09/2023 1100   MCV 91 05/27/2020 1632   MCH 29.1 11/09/2023 1100   MCHC 30.4 11/09/2023 1100   RDW 23.2 (H) 11/09/2023 1100   RDW 14.1 05/27/2020 1632   LYMPHSABS 5.4 (H) 11/09/2023 1100   LYMPHSABS 1.7 05/27/2020 1632   MONOABS 16.0 (H) 11/09/2023 1100   EOSABS 0.2 11/09/2023 1100   EOSABS 0.2 05/27/2020 1632   BASOSABS 0.3 (H) 11/09/2023 1100   BASOSABS 0.0 05/27/2020 1632       Latest Ref Rng & Units 10/19/2023    2:23 PM 08/24/2023    1:40 PM 08/10/2023   12:21 PM  CMP  Glucose 70 - 99 mg/dL 89  98  881   BUN 8 - 23 mg/dL 37  29  29   Creatinine 0.44 - 1.00 mg/dL 6.60  7.77  1.86   Sodium 135 - 145 mmol/L 140  139  138   Potassium 3.5 - 5.1 mmol/L 3.7  3.9  4.1   Chloride 98 - 111 mmol/L 110  112  111   CO2 22 - 32 mmol/L 20  18  18    Calcium  8.9 - 10.3 mg/dL 8.1  8.6  8.3   Total Protein 6.5 - 8.1 g/dL 7.5  7.5  7.9   Total Bilirubin 0.0 - 1.2 mg/dL 0.6  0.5  0.7   Alkaline Phos 38 - 126 U/L 77  74  75   AST 15 - 41 U/L 36  29  21   ALT 0 - 44 U/L 19  22  12       Lab Results  Component Value Date   FERRITIN 267 07/27/2023   VITAMINB12 1,175 (H) 07/22/2022    There were no vitals filed for this visit.  Review of System:  Review of Systems  Constitutional:  Positive for malaise/fatigue.  Gastrointestinal:  Positive for nausea. Negative for blood in stool and constipation.  Musculoskeletal:  Positive for joint pain.  Psychiatric/Behavioral:  The patient is nervous/anxious.     Physical Exam: Physical Exam Constitutional:      Appearance: Normal appearance.  HENT:      Head: Normocephalic and atraumatic.  Eyes:     Pupils: Pupils are equal, round, and reactive to light.  Cardiovascular:     Rate and Rhythm: Normal rate and regular rhythm.     Heart sounds: Normal heart sounds. No murmur heard. Pulmonary:     Effort: Pulmonary effort is normal.     Breath sounds: Normal breath sounds. No wheezing.  Abdominal:     General: Bowel sounds are normal. There is no distension.     Palpations: Abdomen is soft.     Tenderness: There is no abdominal tenderness.  Musculoskeletal:        General: Normal range of motion.     Cervical back: Normal range of motion.  Skin:    General: Skin is warm and dry.     Findings: No rash.  Neurological:     Mental Status: She is alert and oriented to person, place, and time.     Gait: Gait is intact.  Psychiatric:        Mood and Affect: Mood and affect normal.        Cognition and Memory: Memory normal.        Judgment: Judgment normal.      I spent 25 minutes dedicated to the care of this patient (face-to-face and non-face-to-face) on the date of the encounter to include what is described in the assessment and plan.,  Delon Hope, NP 11/09/2023 12:57 PM

## 2023-11-09 NOTE — Addendum Note (Signed)
 Addended by: JENELLE DOROTHYANN PARAS on: 11/09/2023 02:24 PM   Modules accepted: Orders

## 2023-11-09 NOTE — Progress Notes (Signed)
 Hemoglobin is 7.3 today, will give one unit of blood per Randall Hope NP. Patient is aware.

## 2023-11-09 NOTE — Progress Notes (Signed)
 Port flushed with good blood return noted. No bruising or swelling at site. Bandaid applied and patient discharged in satisfactory condition. VVS stable with no signs or symptoms of distressed noted.

## 2023-11-09 NOTE — Assessment & Plan Note (Addendum)
-   She is tolerating momelotinib 100 mg daily reasonably well. - Ultrasound spleen (07/17/2022): Spleen volume is 284 mL, slightly increased from to 40 mL previously. - Labs today: Show worsening anemia and increasing white blood cell count. -Discussed case with Dr. Davonna who recommends bone marrow biopsy to determine if she has converted from MDS to an acute leukemia. -Unfortunately patient had traumatic experience at [facility] with her last bone marrow biopsy resulting in extensive pain to her right hip and back.  Patient has chronic pain already. -Discussed options with patient and daughter which included repeat bone marrow biopsy at [facility] under twilight sedation versus continue to monitor lab work monthly with transfusions as needed. -Patient would like to think about it and discuss with her family.  -Discussed possible blood transfusion tomorrow given she is fatigued and weekly thereafter with labs 1 unit of blood based on results.

## 2023-11-09 NOTE — Patient Instructions (Addendum)
 [facility] at [facility] **VISIT SUMMARY & IMPORTANT INSTRUCTIONS **    You were seen today by Delon Hope for MDS and anemia.   PLAN SUMMARY:   Hold Aranesp .  Recommend bone marrow biopsy.  Return to clinic a few weeks after bone marrow biopsy to review results. Until then, we will bring you back weekly for labs plus or minus as needed.        1. Myeloproliferative neoplasm (HCC) (Primary) Continue momelotinib 100 mg daily.  She could not tolerate higher dose in the past. Recommend follow-up in 12 weeks.  Repeat ultrasound spleen in December.  2. Anemia, unspecified type Received several doses of Aranesp  without improvement of her hemoglobin.  Recommend bone marrow biopsy.  Will give blood transfusion as needed based on hemoglobin.  Will start blood transfusions tomorrow.  Discussed with family regarding bone marrow biopsy.  We tentatively have the scheduled for 11/23/2023.     LABS: Return in approximately 4 weeks after bone marrow biopsy.    OTHER TESTS: Bone marrow biopsy.   MEDICATIONS: No new medications.   FOLLOW-UP APPOINTMENT: After bone marrow biopsy to review results.   ** Thank you for trusting me with your healthcare!  I strive to provide all of my patients with quality care at each visit.  If you receive a survey for this visit, I would be so grateful to you for taking the time to provide feedback.  Thank you in advance!                                          Dr. Mickiel Dry        Pleasant Barefoot, PA-C          Delon Hope, NP    - - - - - - - - - - - - - - - - - -      Thank you for choosing [facility] at [facility] to provide your oncology and hematology care.  To afford each patient quality time with our provider, please arrive at least 15 minutes before your scheduled appointment time.    If you have a lab appointment with the Cancer Center please come in thru the Main Entrance and check in at the  main information desk.   You need to re-schedule your appointment should you arrive 10 or more minutes late.  We strive to give you quality time with our providers, and arriving late affects you and other patients whose appointments are after yours.  Also, if you no show three or more times for appointments you may be dismissed from the clinic at the providers discretion.     Again, thank you for choosing [facility].  Our hope is that these requests will decrease the amount of time that you wait before being seen by our physicians.       _____________________________________________________________   Should you have questions after your visit to [facility], please contact our office at [phone] and follow the prompts.  Our office hours are 8:00 a.m. and 4:30 p.m. Monday - Friday.  Please note that voicemails left after 4:00 p.m. may not be returned until the following business day.  We are closed weekends and major holidays.  You do have access to a nurse 24-7, just call the main number to the clinic  [phone] and do not press any options, hold on the line and a nurse will answer the phone.     For prescription refill requests, have your pharmacy contact our office and allow 72 hours.

## 2023-11-09 NOTE — Addendum Note (Signed)
 Addended by: Ahsley Attwood on: 11/09/2023 11:14 PM   Modules accepted: Level of Service

## 2023-11-09 NOTE — Assessment & Plan Note (Addendum)
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 - We started her on Aranesp  100 mcg every 2 weeks on 07/27/2023 and was increased 250 mcg every 2 weeks on 09/07/2023.   -Since starting Aranesp , hemoglobin initially increased but has slowly declined.  -Today, hemoglobin has further declined to 7.3.  Repeat bone marrow biopsy per above and weekly lab draw plus or minus PRBC.

## 2023-11-10 ENCOUNTER — Other Ambulatory Visit: Payer: Self-pay | Admitting: Oncology

## 2023-11-10 ENCOUNTER — Inpatient Hospital Stay

## 2023-11-10 VITALS — BP 138/66 | HR 56 | Temp 97.2°F | Resp 17

## 2023-11-10 DIAGNOSIS — R7989 Other specified abnormal findings of blood chemistry: Secondary | ICD-10-CM

## 2023-11-10 DIAGNOSIS — R161 Splenomegaly, not elsewhere classified: Secondary | ICD-10-CM

## 2023-11-10 DIAGNOSIS — D631 Anemia in chronic kidney disease: Secondary | ICD-10-CM | POA: Diagnosis not present

## 2023-11-10 DIAGNOSIS — Z79899 Other long term (current) drug therapy: Secondary | ICD-10-CM | POA: Diagnosis not present

## 2023-11-10 DIAGNOSIS — N184 Chronic kidney disease, stage 4 (severe): Secondary | ICD-10-CM | POA: Diagnosis not present

## 2023-11-10 DIAGNOSIS — D649 Anemia, unspecified: Secondary | ICD-10-CM

## 2023-11-10 DIAGNOSIS — D471 Chronic myeloproliferative disease: Secondary | ICD-10-CM | POA: Diagnosis not present

## 2023-11-10 DIAGNOSIS — E611 Iron deficiency: Secondary | ICD-10-CM | POA: Diagnosis not present

## 2023-11-10 MED ORDER — ACETAMINOPHEN 325 MG PO TABS
650.0000 mg | ORAL_TABLET | Freq: Once | ORAL | Status: AC
  Administered 2023-11-10: 650 mg via ORAL
  Filled 2023-11-10: qty 2

## 2023-11-10 MED ORDER — SODIUM CHLORIDE 0.9% IV SOLUTION
250.0000 mL | INTRAVENOUS | Status: DC
  Administered 2023-11-10: 250 mL via INTRAVENOUS

## 2023-11-10 MED ORDER — DIPHENHYDRAMINE HCL 25 MG PO CAPS
25.0000 mg | ORAL_CAPSULE | Freq: Once | ORAL | Status: AC
  Administered 2023-11-10: 25 mg via ORAL
  Filled 2023-11-10: qty 1

## 2023-11-10 MED ORDER — SODIUM CHLORIDE 0.9 % IV SOLN: INTRAVENOUS | Status: DC

## 2023-11-10 NOTE — Patient Instructions (Signed)
 [facility] [place] - A DEPT OF [facility]  Discharge Instructions: Thank you for choosing [facility] to provide your oncology and hematology care.  If you have a lab appointment with the Cancer Center - please note that after April 8th, 2024, all labs will be drawn in the cancer center.  You do not have to check in or register with the main entrance as you have in the past but will complete your check-in in the cancer center.  Wear comfortable clothing and clothing appropriate for easy access to any Portacath or PICC line.   We strive to give you quality time with your provider. You may need to reschedule your appointment if you arrive late (15 or more minutes).  Arriving late affects you and other patients whose appointments are after yours.  Also, if you miss three or more appointments without notifying the office, you may be dismissed from the clinic at the provider's discretion.      For prescription refill requests, have your pharmacy contact our office and allow 72 hours for refills to be completed.    Today you received the following 1 unit of blood and 500mL of normal saline, return as scheduled.    To help prevent nausea and vomiting after your treatment, we encourage you to take your nausea medication as directed.  BELOW ARE SYMPTOMS THAT SHOULD BE REPORTED IMMEDIATELY: *FEVER GREATER THAN 100.4 F (38 C) OR HIGHER *CHILLS OR SWEATING *NAUSEA AND VOMITING THAT IS NOT CONTROLLED WITH YOUR NAUSEA MEDICATION *UNUSUAL SHORTNESS OF BREATH *UNUSUAL BRUISING OR BLEEDING *URINARY PROBLEMS (pain or burning when urinating, or frequent urination) *BOWEL PROBLEMS (unusual diarrhea, constipation, pain near the anus) TENDERNESS IN MOUTH AND THROAT WITH OR WITHOUT PRESENCE OF ULCERS (sore throat, sores in mouth, or a toothache) UNUSUAL RASH, SWELLING OR PAIN  UNUSUAL VAGINAL DISCHARGE OR ITCHING   Items with * indicate a potential emergency and should be  followed up as soon as possible or go to the Emergency Department if any problems should occur.  Please show the CHEMOTHERAPY ALERT CARD or IMMUNOTHERAPY ALERT CARD at check-in to the Emergency Department and triage nurse.  Should you have questions after your visit or need to cancel or reschedule your appointment, please contact [facility]  - A DEPT OF [facility] [phone]  and follow the prompts.  Office hours are 8:00 a.m. to 4:30 p.m. Monday - Friday. Please note that voicemails left after 4:00 p.m. may not be returned until the following business day.  We are closed weekends and major holidays. You have access to a nurse at all times for urgent questions. Please call the main number to the clinic [phone] and follow the prompts.  For any non-urgent questions, you may also contact your provider using MyChart. We now offer e-Visits for anyone 62 and older to request care online for non-urgent symptoms. For details visit [email or web address].   Also download the MyChart app! Go to the app store, search MyChart, open the app, select [place], and log in with your MyChart username and password.

## 2023-11-10 NOTE — Progress Notes (Signed)
 Patient tolerated hydration and blood transfusion with no complaints voiced. Side effects with management reviewed with understanding verbalized. Port site clean and dry with no bruising or swelling noted at site. Good blood return noted before and after administration of therapy. Band aid applied. Patient left in satisfactory condition with VSS and no s/s of distress noted.

## 2023-11-11 LAB — TYPE AND SCREEN
ABO/RH(D): O POS
Antibody Screen: POSITIVE
DAT, IgG: NEGATIVE
Donor AG Type: NEGATIVE
Unit division: 0

## 2023-11-12 ENCOUNTER — Other Ambulatory Visit: Payer: Self-pay | Admitting: Internal Medicine

## 2023-11-14 DIAGNOSIS — F320 Major depressive disorder, single episode, mild: Secondary | ICD-10-CM | POA: Diagnosis not present

## 2023-11-14 DIAGNOSIS — F411 Generalized anxiety disorder: Secondary | ICD-10-CM | POA: Diagnosis not present

## 2023-11-16 ENCOUNTER — Inpatient Hospital Stay

## 2023-11-16 DIAGNOSIS — D649 Anemia, unspecified: Secondary | ICD-10-CM

## 2023-11-16 DIAGNOSIS — Z79899 Other long term (current) drug therapy: Secondary | ICD-10-CM | POA: Diagnosis not present

## 2023-11-16 DIAGNOSIS — E611 Iron deficiency: Secondary | ICD-10-CM | POA: Diagnosis not present

## 2023-11-16 DIAGNOSIS — D631 Anemia in chronic kidney disease: Secondary | ICD-10-CM | POA: Diagnosis not present

## 2023-11-16 DIAGNOSIS — N1832 Chronic kidney disease, stage 3b: Secondary | ICD-10-CM

## 2023-11-16 DIAGNOSIS — D471 Chronic myeloproliferative disease: Secondary | ICD-10-CM | POA: Diagnosis not present

## 2023-11-16 DIAGNOSIS — N184 Chronic kidney disease, stage 4 (severe): Secondary | ICD-10-CM | POA: Diagnosis not present

## 2023-11-16 LAB — CBC
HCT: 26.6 % — ABNORMAL LOW (ref 36.0–46.0)
Hemoglobin: 8.4 g/dL — ABNORMAL LOW (ref 12.0–15.0)
MCH: 29.7 pg (ref 26.0–34.0)
MCHC: 31.6 g/dL (ref 30.0–36.0)
MCV: 94 fL (ref 80.0–100.0)
Platelets: 401 K/uL — ABNORMAL HIGH (ref 150–400)
RBC: 2.83 MIL/uL — ABNORMAL LOW (ref 3.87–5.11)
RDW: 21.5 % — ABNORMAL HIGH (ref 11.5–15.5)
WBC: 47.7 K/uL — ABNORMAL HIGH (ref 4.0–10.5)
nRBC: 2 % — ABNORMAL HIGH (ref 0.0–0.2)

## 2023-11-16 LAB — SAMPLE TO BLOOD BANK

## 2023-11-16 MED ORDER — DARBEPOETIN ALFA 150 MCG/0.3ML IJ SOSY
150.0000 ug | PREFILLED_SYRINGE | Freq: Once | INTRAMUSCULAR | Status: DC
  Filled 2023-11-16: qty 0.3

## 2023-11-16 NOTE — Progress Notes (Signed)
 Per pt and pt.'s family she does not want to proceed with Aranesp  shots . Randall NP notified. NP discussed treatment options to patient and patient's daughter. No new orders given at this time. All follow ups as scheduled.   Renee Waters

## 2023-11-16 NOTE — Progress Notes (Signed)
 Patients port flushed without difficulty.  Good blood return noted with no bruising or swelling noted at site.  Labs drawn per orders.  VSS with discharge and left in satisfactory condition with no s/s of distress noted. All follow ups as scheduled.       Kierre Deines

## 2023-11-23 ENCOUNTER — Other Ambulatory Visit: Payer: Self-pay

## 2023-11-23 ENCOUNTER — Other Ambulatory Visit: Payer: Self-pay | Admitting: Oncology

## 2023-11-23 ENCOUNTER — Ambulatory Visit: Admitting: Radiology

## 2023-11-28 ENCOUNTER — Other Ambulatory Visit: Payer: Self-pay

## 2023-11-28 ENCOUNTER — Other Ambulatory Visit (HOSPITAL_COMMUNITY): Payer: Self-pay

## 2023-11-28 DIAGNOSIS — Z79891 Long term (current) use of opiate analgesic: Secondary | ICD-10-CM | POA: Diagnosis not present

## 2023-11-28 DIAGNOSIS — G894 Chronic pain syndrome: Secondary | ICD-10-CM | POA: Diagnosis not present

## 2023-11-28 DIAGNOSIS — D47Z9 Other specified neoplasms of uncertain behavior of lymphoid, hematopoietic and related tissue: Secondary | ICD-10-CM | POA: Diagnosis not present

## 2023-11-28 DIAGNOSIS — F411 Generalized anxiety disorder: Secondary | ICD-10-CM | POA: Diagnosis not present

## 2023-11-28 DIAGNOSIS — F331 Major depressive disorder, recurrent, moderate: Secondary | ICD-10-CM | POA: Diagnosis not present

## 2023-11-28 DIAGNOSIS — M545 Low back pain, unspecified: Secondary | ICD-10-CM | POA: Diagnosis not present

## 2023-11-28 DIAGNOSIS — Z515 Encounter for palliative care: Secondary | ICD-10-CM | POA: Diagnosis not present

## 2023-11-28 DIAGNOSIS — M549 Dorsalgia, unspecified: Secondary | ICD-10-CM | POA: Diagnosis not present

## 2023-11-28 NOTE — Progress Notes (Signed)
 Specialty Pharmacy Refill Coordination Note  Renee Waters is a 78 y.o. female contacted today regarding refills of specialty medication(s) Momelotinib Dihydrochloride  (OJJAARA )   Patient requested Delivery   Delivery date: 12/05/23   Verified address: [address]  [place] [address]   Medication will be filled on 12/04/23.

## 2023-11-29 ENCOUNTER — Ambulatory Visit (HOSPITAL_COMMUNITY)
Admission: RE | Admit: 2023-11-29 | Discharge: 2023-11-29 | Disposition: A | Discharge status: Home / Self Care | Source: Ambulatory Visit | Attending: Gastroenterology | Admitting: Gastroenterology

## 2023-11-29 ENCOUNTER — Telehealth: Payer: Self-pay

## 2023-11-29 ENCOUNTER — Other Ambulatory Visit (HOSPITAL_COMMUNITY): Payer: Self-pay | Admitting: Gastroenterology

## 2023-11-29 DIAGNOSIS — M545 Low back pain, unspecified: Secondary | ICD-10-CM | POA: Insufficient documentation

## 2023-11-29 DIAGNOSIS — M4316 Spondylolisthesis, lumbar region: Secondary | ICD-10-CM | POA: Diagnosis not present

## 2023-11-29 DIAGNOSIS — M549 Dorsalgia, unspecified: Secondary | ICD-10-CM | POA: Insufficient documentation

## 2023-11-29 DIAGNOSIS — M48061 Spinal stenosis, lumbar region without neurogenic claudication: Secondary | ICD-10-CM | POA: Diagnosis not present

## 2023-11-29 DIAGNOSIS — M419 Scoliosis, unspecified: Secondary | ICD-10-CM | POA: Diagnosis not present

## 2023-11-29 DIAGNOSIS — Z96641 Presence of right artificial hip joint: Secondary | ICD-10-CM | POA: Diagnosis not present

## 2023-11-29 DIAGNOSIS — M546 Pain in thoracic spine: Secondary | ICD-10-CM | POA: Diagnosis not present

## 2023-11-29 DIAGNOSIS — M47814 Spondylosis without myelopathy or radiculopathy, thoracic region: Secondary | ICD-10-CM | POA: Diagnosis not present

## 2023-11-29 DIAGNOSIS — M47816 Spondylosis without myelopathy or radiculopathy, lumbar region: Secondary | ICD-10-CM | POA: Diagnosis not present

## 2023-11-29 NOTE — Telephone Encounter (Signed)
 Pt called stating that the symproic  is not working. Please advise.

## 2023-11-30 ENCOUNTER — Inpatient Hospital Stay

## 2023-11-30 ENCOUNTER — Other Ambulatory Visit: Payer: Self-pay

## 2023-11-30 ENCOUNTER — Inpatient Hospital Stay: Admitting: Oncology

## 2023-11-30 VITALS — Temp 97.9°F | Wt 122.0 lb

## 2023-11-30 DIAGNOSIS — E611 Iron deficiency: Secondary | ICD-10-CM | POA: Diagnosis not present

## 2023-11-30 DIAGNOSIS — D471 Chronic myeloproliferative disease: Secondary | ICD-10-CM

## 2023-11-30 DIAGNOSIS — D631 Anemia in chronic kidney disease: Secondary | ICD-10-CM | POA: Diagnosis not present

## 2023-11-30 DIAGNOSIS — D649 Anemia, unspecified: Secondary | ICD-10-CM

## 2023-11-30 DIAGNOSIS — N184 Chronic kidney disease, stage 4 (severe): Secondary | ICD-10-CM | POA: Diagnosis not present

## 2023-11-30 DIAGNOSIS — R630 Anorexia: Secondary | ICD-10-CM

## 2023-11-30 DIAGNOSIS — Z79899 Other long term (current) drug therapy: Secondary | ICD-10-CM | POA: Diagnosis not present

## 2023-11-30 LAB — CBC WITH DIFFERENTIAL/PLATELET
Abs Immature Granulocytes: 5.5 K/uL — ABNORMAL HIGH (ref 0.00–0.07)
Band Neutrophils: 3 %
Basophils Absolute: 0 K/uL (ref 0.0–0.1)
Basophils Relative: 0 %
Eosinophils Absolute: 0.5 K/uL (ref 0.0–0.5)
Eosinophils Relative: 1 %
HCT: 25.3 % — ABNORMAL LOW (ref 36.0–46.0)
Hemoglobin: 7.6 g/dL — ABNORMAL LOW (ref 12.0–15.0)
Lymphocytes Relative: 47 %
Lymphs Abs: 25.7 K/uL — ABNORMAL HIGH (ref 0.7–4.0)
MCH: 28.8 pg (ref 26.0–34.0)
MCHC: 30 g/dL (ref 30.0–36.0)
MCV: 95.8 fL (ref 80.0–100.0)
Metamyelocytes Relative: 6 %
Monocytes Absolute: 1.1 K/uL — ABNORMAL HIGH (ref 0.1–1.0)
Monocytes Relative: 2 %
Myelocytes: 2 %
Neutro Abs: 21.9 K/uL — ABNORMAL HIGH (ref 1.7–7.7)
Neutrophils Relative %: 37 %
Platelets: 357 K/uL (ref 150–400)
Promyelocytes Relative: 2 %
RBC: 2.64 MIL/uL — ABNORMAL LOW (ref 3.87–5.11)
RDW: 22 % — ABNORMAL HIGH (ref 11.5–15.5)
WBC: 54.7 K/uL (ref 4.0–10.5)
nRBC: 2.7 % — ABNORMAL HIGH (ref 0.0–0.2)

## 2023-11-30 LAB — COMPREHENSIVE METABOLIC PANEL WITH GFR
ALT: 16 U/L (ref 0–44)
AST: 43 U/L — ABNORMAL HIGH (ref 15–41)
Albumin: 3.8 g/dL (ref 3.5–5.0)
Alkaline Phosphatase: 97 U/L (ref 38–126)
Anion gap: 11 (ref 5–15)
BUN: 30 mg/dL — ABNORMAL HIGH (ref 8–23)
CO2: 21 mmol/L — ABNORMAL LOW (ref 22–32)
Calcium: 8.6 mg/dL — ABNORMAL LOW (ref 8.9–10.3)
Chloride: 110 mmol/L (ref 98–111)
Creatinine, Ser: 2.99 mg/dL — ABNORMAL HIGH (ref 0.44–1.00)
GFR, Estimated: 16 mL/min — ABNORMAL LOW (ref >60)
Glucose, Bld: 113 mg/dL — ABNORMAL HIGH (ref 70–99)
Potassium: 4 mmol/L (ref 3.5–5.1)
Sodium: 142 mmol/L (ref 135–145)
Total Bilirubin: 0.6 mg/dL (ref 0.0–1.2)
Total Protein: 8.5 g/dL — ABNORMAL HIGH (ref 6.5–8.1)

## 2023-11-30 LAB — IRON AND TIBC
Iron: 128 ug/dL (ref 28–170)
Saturation Ratios: 61 % — ABNORMAL HIGH (ref 10.4–31.8)
TIBC: 209 ug/dL — ABNORMAL LOW (ref 250–450)
UIBC: 81 ug/dL

## 2023-11-30 LAB — SAMPLE TO BLOOD BANK

## 2023-11-30 LAB — PREPARE RBC (CROSSMATCH)

## 2023-11-30 LAB — LACTATE DEHYDROGENASE: LDH: 960 U/L — ABNORMAL HIGH (ref 98–192)

## 2023-11-30 LAB — FERRITIN: Ferritin: 819 ng/mL — ABNORMAL HIGH (ref 11–307)

## 2023-11-30 MED ORDER — ALPRAZOLAM 0.5 MG PO TABS: 0.5000 mg | ORAL_TABLET | Freq: Once | ORAL | 0 refills | Status: AC

## 2023-11-30 NOTE — Patient Instructions (Signed)
 [facility] at [facility] Discharge Instructions   You were seen and examined today by Dr. Davonna.  She reviewed the results of your lab work which are normal/stable.   We will see you back after the bone marrow biopsy.   Return as scheduled.    Thank you for choosing [facility] at [facility] to provide your oncology and hematology care.  To afford each patient quality time with our provider, please arrive at least 15 minutes before your scheduled appointment time.   If you have a lab appointment with the Cancer Center please come in thru the Main Entrance and check in at the main information desk.  You need to re-schedule your appointment should you arrive 10 or more minutes late.  We strive to give you quality time with our providers, and arriving late affects you and other patients whose appointments are after yours.  Also, if you no show three or more times for appointments you may be dismissed from the clinic at the providers discretion.     Again, thank you for choosing [facility].  Our hope is that these requests will decrease the amount of time that you wait before being seen by our physicians.       _____________________________________________________________  Should you have questions after your visit to [facility], please contact our office at [phone] and follow the prompts.  Our office hours are 8:00 a.m. and 4:30 p.m. Monday - Friday.  Please note that voicemails left after 4:00 p.m. may not be returned until the following business day.  We are closed weekends and major holidays.  You do have access to a nurse 24-7, just call the main number to the clinic [phone] and do not press any options, hold on the line and a nurse will answer the phone.    For prescription refill requests, have your pharmacy contact our office and allow 72 hours.    Due to Covid, you will need to wear a mask upon  entering the hospital. If you do not have a mask, a mask will be given to you at the Main Entrance upon arrival. For doctor visits, patients may have 1 support person age 69 or older with them. For treatment visits, patients can not have anyone with them due to social distancing guidelines and our immunocompromised population.

## 2023-11-30 NOTE — Progress Notes (Signed)
 Patient Care Team: Antonetta Rollene BRAVO, MD as PCP - General Mallipeddi, Diannah SQUIBB, MD as PCP - Cardiology (Cardiology) Shaaron Lamar HERO, MD (Gastroenterology) Margrette Taft BRAVO, MD as Consulting Physician (Orthopedic Surgery) Celestia Joesph SQUIBB, RN as Oncology Nurse Navigator (Oncology) Ramonita Suzen CROME, RN (Inactive) as [facility], [facility] Ophthalmology (Ophthalmology)  Clinic Day:  11/30/2023  Referring physician: Antonetta Rollene BRAVO, MD   CHIEF COMPLAINT:  CC: MPL positive myeloproliferative neoplasm   Renee Waters 78 y.o. female was transferred to my care after her prior physician has left.   ASSESSMENT & PLAN:   Assessment & Plan: Renee Waters  is a 78 y.o. female with MPL positive myeloproliferative neoplasm  MPN Myeloproliferative neoplasm positive for MPL Patient initially was on ruxolitinib  and changed to momelotinib on progression.  Extensive oncology history below. Patient was previously seen by Dr. Perri at [facility]  - We reviewed the labs from today and.  CMP: Creatinine: 2.99-slowly worsening.  AST: 43, ALT: 16, CBC: WBC: 54.7, hemoglobin: 7.6, platelets: 357, ANC: 21.9, ANC: 25.7, absolute immature granulocytes: 5.50. - We discussed the importance of bone marrow biopsy to rule out leukemic transformation.  Patient had previous traumatic experience from bone marrow biopsy and was very reluctant.  We discussed risk versus benefits in detail provide added Xanax  pill to be taken prior to bone marrow biopsy.  Patient is agreeable at this time and will get this scheduled at [facility] - Continue momelotinib 100 mg daily at this time - Considering patient's hemoglobin is 7.6, will administer 1 unit of packed red blood cells today. - Continue aranesp  250 mcg every 2 weeks  Return to clinic after bone marrow biopsy to discuss results and further management  Loss of appetite -  Continue Megace  400 mg twice daily.  Weight has been stable.  The patient understands the plans discussed today and is in agreement with them.  She knows to contact our office if she develops concerns prior to her next appointment.  40 minutes of total time was spent for this patient encounter, including preparation,review of records,  face-to-face counseling with the patient and coordination of care, physical exam, and documentation of the encounter.    Renee Waters,acting as a Neurosurgeon for Mickiel Dry, MD.,have documented all relevant documentation on the behalf of Mickiel Dry, MD,as directed by  Mickiel Dry, MD while in the presence of Mickiel Dry, MD.  I, Mickiel Dry MD, have reviewed the above documentation for accuracy and completeness, and I agree with the above.    Mickiel Dry, MD  [facility] [place] - A DEPT OF [facility] [address] [place] [address] Dept: [phone] Dept Fax: [phone]   Orders Placed This Encounter  Procedures   CT BONE MARROW BIOPSY & ASPIRATION    Cytogenetics; MDS FISH; AML FISH    Standing Status:   Future    Expected Date:   12/07/2023    Expiration Date:   11/29/2024    Reason for Exam (SYMPTOM  OR DIAGNOSIS REQUIRED):   MPL positive myeloproliferative neoplasm    Preferred location?:   [facility]     ONCOLOGY HISTORY:   I have reviewed her chart and materials related to her cancer extensively and collaborated history with the patient. Summary of oncologic history is as follows:   Diagnosis: MPL positive myeloproliferative neoplasm   -Presentation: Unintentional 25 pound weight  loss over 6 months, decreased appetite, fatigue, and night sweats for 1 month -11/06/2020: CBC diff: WBC: 75.7 with ANC of 48.4, absolute monocytes at 9.1, absolute eosinophils at 0.8, absolute basophils at 0.8, and absolute immature granulocytes at 12.85. HGB-10.1.  nRBC-1.2.  -11/06/2020: LDH 589.  -11/06/2020: Pathologist blood smear review: Normocytic anemia. Marked leukocytosis with leukoerythroblastic reaction.  -11/09/2020: Flow cytometry of peripheral blood: Minor myeloblastic population identified (1%). Abnormal cell phenotype: CD33, CD34, CD38, CD117 (dim), HLA-Dr  -11/18/2020: Bone Marrow Biopsy.  Pathology: Hypercellular bone marrow (90%) with granulocytic proliferation. Flow cytometric analysis was performed and failed to show any significant CD34-positive blastic population or significant lymphoid population.  Chromosome Analysis: Normal Female Karyotype -11/19/2020: BCR/ABL negative -11/30/2020:  JAK2 reflex mutation testing showed positive MPL mutation. -11/30/2020: NGS myeloid panel shows  ASXL1, MPL, TET2, EZH2 mutations.  -12/15/2020: US  Spleen: The spleen is mildly enlarged measuring 12.4 x 9.6 x 7.0 cm with a volume of 435 cc. -01/12/2021: PDGFR alpha, beta and FGFR 1 was negative.  -01/29/2021: Patient evaluated by Dr. Perri and had slides reviewed by hematopathology at [facility] who perceived diagnosis was less likely CMML and more likely MPL positive myeloproliferative neoplasm.  Dr. Perri recommended Jakafi  for treatment. Poor candidate for bone marrow transplant( Baseline osteoarthritis and worsened  B symptoms) -02/16/2021-01/23/2022: Ruxolitinib  10 mg twice daily, dose increased to 15 mg twice daily on 05/18/2021 -04/07/2021: CT Abdomen: Mild splenomegaly, maximum coronal span 14.3 cm.  -09/06/2021: CBC diff: WBC-134.7 with ANC of 91.6, absolute lymphocytes at 10.8, absolute monocytes at 13.5, absolute immature granulocytes at 17.50. HGB- 9.3. nRBC-2.8.  -09/09/2021: Bone Marrow Biopsy.  Pathology: Hypercellular bone marrow with features of myeloid neoplasm. A significant blastic population is not seen by morphology or CD34 immunohistochemistry.  Chromosome Analysis: Normal Female Karyotype.  -09/09/2021: Pathologist blood smear  review: Granulocytosis with left shifted granulopoiesis, consistent with the know history of myeloproliferative neoplasm. Rare circulating blasts.  -09/21/2021: US  Spleen:  The spleen is again mildly enlarged measuring 13.6 x 6.6 x 11.3 cm with volume of 532 mL. -01/26/2022-current: Momelotinib 100 mg daily, dose increased to 150 mg daily on 07/19/2022, does reduced to 100 mg daily due to generalized itching on 08/09/2022  -07/17/2023: US  Spleen: The spleen is at the upper limit of normal and measures 10.9 x 4.9 x 10.3 cm with a volume of 284.1 mL.  -11/30/2023: CBC with differential: WBC: 54.7, hemoglobin: 7.6, platelets: 357, ANC: 21.9, ALC: 25.7, absolute immature granulocytes: 5.50.   Current Treatment:  Momelotinib 100 mg daily  INTERVAL HISTORY:   Discussed the use of AI scribe software for clinical note transcription with the patient, who gave verbal consent to proceed.  History of Present Illness Renee Waters is a 78 year old female who presents for follow-up of her MPN and to establish care with me.  She is accompanied by her daughter and husband.  She has a significant increase in her white blood cell count, rising from 47.7 two weeks ago to 54.7 today. Her hemoglobin level is low at 7.6, and she is scheduled to receive a unit of blood.  She has been on momelotinib since December 2023.  Re discussed the need of bone marrow biopsy with the patient to rule out leukemic transformation. Patient reported that she had a traumatic experience from her prior bone marrow biopsy and was very reluctant to get another one. Explained the importance of bone marrow biopsy in detail to the patient and her daughter. Patient agreed to get it  scheduled.     I have reviewed the past medical history, past surgical history, social history and family history with the patient and they are unchanged from previous note.  ALLERGIES:  is allergic to bee venom, acetaminophen , norvasc  [amlodipine ],  hydralazine , red dye #40 (allura red), percocet [oxycodone -acetaminophen ], and tyloxapol.  MEDICATIONS:  Current Outpatient Medications  Medication Sig Dispense Refill   acyclovir  (ZOVIRAX ) 800 MG tablet TAKE 1 TABLET BY MOUTH FIVE TIMES DAILY AS NEEDED 30 tablet 0   allopurinol  (ZYLOPRIM ) 100 MG tablet Take 2 tablets by mouth once daily 60 tablet 0   EPINEPHrine  0.3 mg/0.3 mL IJ SOAJ injection INJECT 0.3 MLS INTO MUSCLE ONCE AS NEEDED 1 each 2   hydrOXYzine  (ATARAX ) 50 MG tablet Take 1 tablet by mouth three times daily as needed 90 tablet 3   lidocaine -prilocaine  (EMLA ) cream APPLY TOPICALLY AS NEEDED TO ACCESS PORT 30 g 5   megestrol  (MEGACE ) 400 MG/10ML suspension Take 10 mLs (400 mg total) by mouth 2 (two) times daily. 480 mL 2   metoprolol  tartrate (LOPRESSOR ) 25 MG tablet Take half tablet by mouthtwo times daily 90 tablet 1   mirtazapine  (REMERON ) 15 MG tablet Take 1 tablet (15 mg total) by mouth at bedtime. 30 tablet 5   momelotinib dihydrochloride  (OJJAARA ) 100 MG tablet Take 1 tablet (100 mg total) by mouth daily. 30 tablet 3   Multiple Vitamin (MULTIVITAMIN WITH MINERALS) TABS tablet Take 1 tablet by mouth daily. 130 tablet 1   Naldemedine Tosylate  (SYMPROIC ) 0.2 MG TABS Take 0.2 mg by mouth daily. 30 tablet 11   oxyCODONE  (ROXICODONE ) 15 MG immediate release tablet Take 15 mg by mouth every 6 (six) hours as needed.     pantoprazole  (PROTONIX ) 40 MG tablet Take 1 tablet (40 mg total) by mouth 2 (two) times daily before a meal. 60 tablet 11   potassium chloride  (KLOR-CON ) 10 MEQ tablet Take 1 tablet by mouth once daily 30 tablet 0   pregabalin  (LYRICA ) 25 MG capsule Take 1 capsule (25 mg total) by mouth 3 (three) times daily. 90 capsule 3   prochlorperazine  (COMPAZINE ) 10 MG tablet TAKE 1 TABLET BY MOUTH EVERY 6 HOURS AS NEEDED FOR NAUSEA OR VOMITING 60 tablet 0   temazepam  (RESTORIL ) 30 MG capsule Take 1 capsule (30 mg total) by mouth at bedtime as needed for sleep. 30 capsule 5    triamcinolone  ointment (KENALOG ) 0.5 % Apply topically daily. 30 g 0   trimethoprim  (TRIMPEX ) 100 MG tablet Take 1 tablet by mouth once daily 30 tablet 3   UNABLE TO FIND Med Name: Covid Vaccine 2025 1 each 0   VOLTAREN  1 % GEL Apply 2 g topically 4 (four) times daily. Apply to affected area(s) up to 4 times daily, as needed, for pain 350 g 1   ALPRAZolam  (XANAX ) 0.5 MG tablet Take 1 tablet (0.5 mg total) by mouth once for 1 dose. Take one hour prior to bone marrow biopsy 1 tablet 0   No current facility-administered medications for this visit.   Facility-Administered Medications Ordered in Other Visits  Medication Dose Route Frequency Provider Last Rate Last Admin   Darbepoetin Alfa  (ARANESP ) injection 150 mcg  150 mcg Subcutaneous Once Renee Schumm, MD       lanreotide acetate  (SOMATULINE DEPOT ) 120 MG/0.5ML injection            lanreotide acetate  (SOMATULINE DEPOT ) 120 MG/0.5ML injection            lanreotide acetate  (SOMATULINE DEPOT ) 120 MG/0.5ML  injection            octreotide  (SANDOSTATIN  LAR) 30 MG IM injection            octreotide  (SANDOSTATIN  LAR) 30 MG IM injection             REVIEW OF SYSTEMS:   Constitutional: Denies fevers, chills or abnormal weight loss Eyes: Denies blurriness of vision Ears, nose, mouth, throat, and face: Denies mucositis or sore throat Respiratory: Denies cough, dyspnea or wheezes Cardiovascular: Denies palpitation, chest discomfort or lower extremity swelling Gastrointestinal:  Denies nausea, heartburn or change in bowel habits Skin: Denies abnormal skin rashes Lymphatics: Denies new lymphadenopathy or easy bruising Neurological:Denies numbness, tingling or new weaknesses Behavioral/Psych: Mood is stable, no new changes  All other systems were reviewed with the patient and are negative.   VITALS:  Temperature 97.9 F (36.6 C), weight 122 lb (55.3 kg).  Wt Readings from Last 3 Encounters:  11/30/23 122 lb (55.3 kg)  11/09/23 121 lb 0.5 oz  (54.9 kg)  11/02/23 126 lb (57.2 kg)    Body mass index is 20.3 kg/m.  Performance status (ECOG): 2 - Symptomatic, <50% confined to bed  PHYSICAL EXAM:   GENERAL:alert, no distress and comfortable SKIN: skin color, texture, turgor are normal, no rashes or significant lesions LYMPH:  no palpable lymphadenopathy in the cervical, axillary or inguinal LUNGS: clear to auscultation and percussion with normal breathing effort HEART: regular rate & rhythm and no murmurs and no lower extremity edema ABDOMEN:abdomen soft, non-tender and normal bowel sounds Musculoskeletal:no cyanosis of digits and no clubbing  NEURO: alert & oriented x 3 with fluent speech  LABORATORY DATA:  I have reviewed the data as listed  Lab Results  Component Value Date   WBC 54.7 (HH) 11/30/2023   NEUTROABS 21.9 (H) 11/30/2023   HGB 7.6 (L) 11/30/2023   HCT 25.3 (L) 11/30/2023   MCV 95.8 11/30/2023   PLT 357 11/30/2023    Latest Reference Range & Units 11/30/23 09:26  Neutrophils % 37  Lymphocytes % 47  Monocytes Relative % 2  Eosinophil % 1  Basophil % 0  NEUT# 1.7 - 7.7 K/uL 21.9 (H)  Lymphs Abs 0.7 - 4.0 K/uL 25.7 (H)  Monocyte # 0.1 - 1.0 K/uL 1.1 (H)  Eosinophils Absolute 0.0 - 0.5 K/uL 0.5  Basophils Absolute 0.0 - 0.1 K/uL 0.0  Abs Immature Granulocytes 0.00 - 0.07 K/uL 5.50 (H)  Band Neutrophils % 3  Metamyelocytes Relative % 6  Myelocytes % 2  Promyelocytes Relative % 2  Basophilic Stippling  PRESENT  Polychromasia  PRESENT  WBC Morphology  See Note  Abnormal Lymphocytes Present  PRESENT  Reactive, Benign Lymphocytes  PRESENT  Giant PLTs  PRESENT  (H): Data is abnormally high    Chemistry      Component Value Date/Time   NA 142 11/30/2023 0926   NA 140 10/21/2020 1135   K 4.0 11/30/2023 0926   CL 110 11/30/2023 0926   CO2 21 (L) 11/30/2023 0926   BUN 30 (H) 11/30/2023 0926   BUN 22 10/21/2020 1135   CREATININE 2.99 (H) 11/30/2023 0926   CREATININE 0.99 11/03/2014 1003       Component Value Date/Time   CALCIUM  8.6 (L) 11/30/2023 0926   ALKPHOS 97 11/30/2023 0926   AST 43 (H) 11/30/2023 0926   ALT 16 11/30/2023 0926   BILITOT 0.6 11/30/2023 0926   BILITOT 0.3 10/21/2020 1135       Latest Reference Range &  Units 11/30/23 09:26  Iron 28 - 170 ug/dL 871  UIBC ug/dL 81  TIBC 749 - 549 ug/dL 790 (L)  Saturation Ratios 10.4 - 31.8 % 61 (H)  Ferritin 11 - 307 ng/mL 819 (H)  (L): Data is abnormally low (H): Data is abnormally high  RADIOGRAPHIC STUDIES: I have personally reviewed the radiological images as listed and agreed with the findings in the report. US  SPLEEN (ABDOMEN LIMITED) CLINICAL DATA:  Evaluate splenomegaly  EXAM: ULTRASOUND ABDOMEN LIMITED  COMPARISON:  Ultrasound 10/11/2022  FINDINGS: The spleen measures 10.9 x 4.9 x 10.3 cm with a volume of 284.1 mL. This is slightly increased from 10/11/2022 when it measured 10.2 cm in length with a volume of 240 mL.  IMPRESSION: Spleen is at the upper limits of normal in size (284 mL), slightly increased from prior when it measured 240 mL.  Electronically Signed   By: Norman Gatlin M.D.   On: 07/21/2023 03:49

## 2023-11-30 NOTE — Progress Notes (Signed)
 Per MD pt will receive her Aranesp  shot tomorrow- 12/01/23 when she receives her blood transfusion. Treatment team updated. All follow ups as scheduled.   Renee Waters

## 2023-11-30 NOTE — Progress Notes (Signed)
 Patients port flushed without difficulty.  Good blood return noted with no bruising or swelling noted at site.  Labs drawn per orders.  VSS with discharge and left in satisfactory condition with no s/s of distress noted. All follow ups as scheduled.       Kierre Deines

## 2023-12-01 ENCOUNTER — Inpatient Hospital Stay

## 2023-12-01 ENCOUNTER — Other Ambulatory Visit: Payer: Self-pay

## 2023-12-01 DIAGNOSIS — D471 Chronic myeloproliferative disease: Secondary | ICD-10-CM | POA: Diagnosis not present

## 2023-12-01 DIAGNOSIS — D649 Anemia, unspecified: Secondary | ICD-10-CM

## 2023-12-01 DIAGNOSIS — E611 Iron deficiency: Secondary | ICD-10-CM | POA: Diagnosis not present

## 2023-12-01 DIAGNOSIS — D631 Anemia in chronic kidney disease: Secondary | ICD-10-CM | POA: Diagnosis not present

## 2023-12-01 DIAGNOSIS — Z79899 Other long term (current) drug therapy: Secondary | ICD-10-CM | POA: Diagnosis not present

## 2023-12-01 DIAGNOSIS — N184 Chronic kidney disease, stage 4 (severe): Secondary | ICD-10-CM | POA: Diagnosis not present

## 2023-12-01 MED ORDER — IBSRELA 50 MG PO TABS: 50.0000 mg | ORAL_TABLET | Freq: Two times a day (BID) | ORAL | Status: AC

## 2023-12-01 MED ORDER — SODIUM CHLORIDE 0.9% IV SOLUTION
250.0000 mL | INTRAVENOUS | Status: DC
  Administered 2023-12-01: 100 mL via INTRAVENOUS

## 2023-12-01 NOTE — Progress Notes (Signed)
 Medication Samples have been provided to the patient.  Drug name: Damon       Strength: 50 mg        Qty: 5  LOT: [id number]  Exp.Date: 04/07/2026  Dosing instructions: Take 1 tablet twice daily immediately before a meal.  The patient has been instructed regarding the correct time, dose, and frequency of taking this medication, including desired effects and most common side effects.   Madelin CHRISTELLA Boom 9:01 AM 12/01/2023

## 2023-12-01 NOTE — Telephone Encounter (Signed)
 Pt has tried and failed all strengths of Linzess . She has not tried [place] or [place]. We have samples on hand of Ibsrela if you want to try her on that.

## 2023-12-01 NOTE — Progress Notes (Signed)
 Pt is refusing the aranesp  injection today. MD made aware.

## 2023-12-01 NOTE — Patient Instructions (Signed)
 [facility] [place] - A DEPT OF . [facility]  Discharge Instructions: Thank you for choosing [facility] to provide your oncology and hematology care.  If you have a lab appointment with the Cancer Center - please note that after April 8th, 2024, all labs will be drawn in the cancer center.  You do not have to check in or register with the main entrance as you have in the past but will complete your check-in in the cancer center.  Wear comfortable clothing and clothing appropriate for easy access to any Portacath or PICC line.   We strive to give you quality time with your provider. You may need to reschedule your appointment if you arrive late (15 or more minutes).  Arriving late affects you and other patients whose appointments are after yours.  Also, if you miss three or more appointments without notifying the office, you may be dismissed from the clinic at the provider's discretion.      For prescription refill requests, have your pharmacy contact our office and allow 72 hours for refills to be completed.    Today you received the following: one unit of blood today   To help prevent nausea and vomiting after your treatment, we encourage you to take your nausea medication as directed.  BELOW ARE SYMPTOMS THAT SHOULD BE REPORTED IMMEDIATELY: *FEVER GREATER THAN 100.4 F (38 C) OR HIGHER *CHILLS OR SWEATING *NAUSEA AND VOMITING THAT IS NOT CONTROLLED WITH YOUR NAUSEA MEDICATION *UNUSUAL SHORTNESS OF BREATH *UNUSUAL BRUISING OR BLEEDING *URINARY PROBLEMS (pain or burning when urinating, or frequent urination) *BOWEL PROBLEMS (unusual diarrhea, constipation, pain near the anus) TENDERNESS IN MOUTH AND THROAT WITH OR WITHOUT PRESENCE OF ULCERS (sore throat, sores in mouth, or a toothache) UNUSUAL RASH, SWELLING OR PAIN  UNUSUAL VAGINAL DISCHARGE OR ITCHING   Items with * indicate a potential emergency and should be followed up as soon as possible or go to  the Emergency Department if any problems should occur.  Please show the CHEMOTHERAPY ALERT CARD or IMMUNOTHERAPY ALERT CARD at check-in to the Emergency Department and triage nurse.  Should you have questions after your visit or need to cancel or reschedule your appointment, please contact [facility] [place] - A DEPT OF [facility] [phone]  and follow the prompts.  Office hours are 8:00 a.m. to 4:30 p.m. Monday - Friday. Please note that voicemails left after 4:00 p.m. may not be returned until the following business day.  We are closed weekends and major holidays. You have access to a nurse at all times for urgent questions. Please call the main number to the clinic [phone] and follow the prompts.  For any non-urgent questions, you may also contact your provider using MyChart. We now offer e-Visits for anyone 96 and older to request care online for non-urgent symptoms. For details visit [email or web address].   Also download the MyChart app! Go to the app store, search MyChart, open the app, select [place], and log in with your MyChart username and password.

## 2023-12-01 NOTE — Telephone Encounter (Signed)
 Pt was made aware and verbalized understanding. Samples were placed up front for pick up.

## 2023-12-01 NOTE — Progress Notes (Signed)
One unit of blood given per orders. Patient tolerated it well without problems. Vitals stable and discharged home from clinic ambulatory. Follow up as scheduled.  

## 2023-12-02 LAB — TYPE AND SCREEN
ABO/RH(D): O POS
Antibody Screen: POSITIVE
DAT, IgG: NEGATIVE
Donor AG Type: NEGATIVE
Unit division: 0

## 2023-12-02 LAB — BPAM RBC
Blood Product Expiration Date: 202511152359
ISSUE DATE / TIME: 202510241348
Unit Type and Rh: 6200

## 2023-12-04 ENCOUNTER — Other Ambulatory Visit: Payer: Self-pay

## 2023-12-07 NOTE — Progress Notes (Signed)
 Patient for CT Bone Marrow Biopsy on Monday 12/15/23, I called and spoke with the patient on the phone and gave pre-procedure instructions. Pt was made aware to be here at 7:30a, NPO after MN prior to procedure as well as driver post procedure/recovery/discharge. Pt stated understanding.  Called 12/07/23

## 2023-12-08 ENCOUNTER — Other Ambulatory Visit (HOSPITAL_COMMUNITY): Payer: Self-pay | Admitting: Student

## 2023-12-08 DIAGNOSIS — D471 Chronic myeloproliferative disease: Secondary | ICD-10-CM

## 2023-12-10 NOTE — H&P (Signed)
 Chief Complaint: Patient was seen in consultation today for myeloproliferative neoplasm.   Referring Physician(s): Davonna Siad  Supervising Physician: {Supervising Physician:[id number]}  Patient Status: [facility] - Out-pt  History of Present Illness: Renee Waters is a 78 y.o. female with a medical history significant for MPL positive myeloproliferative neoplasm initially diagnosed in 2022. She is familiar to IR from several bone marrow biopsies as well as port-a-catheter placement. Her last BM bx was 09/09/21  She maintains close follow up with her Hematology/Oncology team and has been treated with several different medications. Recent labs are concerning for possible leukemic transformation and an updated bone marrow biopsy with aspiration has been recommended.   Past Medical History:  Diagnosis Date   Acute cholangitis (HCC)    Acute respiratory failure with hypoxia (HCC) 09/01/2021   Allergy    Anemia    Anxiety    Arthritis    Phreesia 03/18/2020   Barrett's esophagus    Cataract    Chronic back pain    Chronic neck pain    CKD (chronic kidney disease) stage 3, GFR 30-59 ml/min (HCC) 10/29/2020   Depression    Dyslipidemia 07/16/2023   Genital herpes    GERD (gastroesophageal reflux disease)    H/O degenerative disc disease    History of blood transfusion    Hypertension    Hypoxia 12/01/2021   Insomnia    Lupus (systemic lupus erythematosus) (HCC)    Neuromuscular disorder (HCC)    Osteoarthritis    S/P colonoscopy 07/2003   normal, no polyps   S/P endoscopy June 2005, Oct 2009   2005: short-segment Barrett's, 2009: short-segment Barrett's   Upper respiratory tract infection 07/09/2020   UTI (lower urinary tract infection) 11/2012    Past Surgical History:  Procedure Laterality Date   ABDOMINAL HYSTERECTOMY     BACK SURGERY     BIOPSY N/A 03/20/2014   Procedure: BIOPSY;  Surgeon: Lamar CHRISTELLA Hollingshead, MD;  Location: [facility];  Service: Endoscopy;  Laterality: N/A;    BIOPSY  09/14/2015   Procedure: BIOPSY;  Surgeon: Lamar CHRISTELLA Hollingshead, MD;  Location: [facility];  Service: Endoscopy;;  esophageal and gastric   BIOPSY  07/23/2019   Procedure: BIOPSY;  Surgeon: Hollingshead Lamar CHRISTELLA, MD;  Location: [facility];  Service: Endoscopy;;  esophageal    CARPAL TUNNEL RELEASE Left 2013   cervical disectomy  2002   CESAREAN SECTION N/A    Phreesia 03/18/2020   CHOLECYSTECTOMY     with lysis of adhesions for sbo; ruptured gallbladder.   COLONOSCOPY  11/09/2011   RMR: Melanosis coli   COLONOSCOPY WITH PROPOFOL  N/A 09/14/2015   Dr. Hollingshead: diverticulosis, 3mm TA removed. next TCS 09/2020.    COLONOSCOPY WITH PROPOFOL  N/A 04/30/2020   Procedure: COLONOSCOPY WITH PROPOFOL ;  Surgeon: Hollingshead Lamar CHRISTELLA, MD;  Location: [facility];  Service: Endoscopy;  Laterality: N/A;  PM (ASA 3)   DENTAL SURGERY  11/2015   multiple tooth extraction   ESOPHAGOGASTRODUODENOSCOPY  11/29/2007   salmon-colored  tongue   longest stable at  3 cm, distal esophagus as described previously status post biopsy/ Hiatal hernia, otherwise normal stomach D1 and D2   ESOPHAGOGASTRODUODENOSCOPY  01/06/11   short segment Barrett's esophagus s/p bx/Hiatal hernia   ESOPHAGOGASTRODUODENOSCOPY (EGD) WITH PROPOFOL  N/A 03/20/2014   MFM:[id number] distal esophagus short segment barrett's, bx with no dysplasia. next egd in 03/2017   ESOPHAGOGASTRODUODENOSCOPY (EGD) WITH PROPOFOL  N/A 09/14/2015   Dr. Hollingshead: Barrett's without dysplasia, gastritis benign bx, hiatal hernia. next  EGD 09/2018.   ESOPHAGOGASTRODUODENOSCOPY (EGD) WITH PROPOFOL  N/A 07/23/2019   Procedure: ESOPHAGOGASTRODUODENOSCOPY (EGD) WITH PROPOFOL ;  Surgeon: Shaaron Lamar HERO, MD;  [facility]: [facility];  Service: Endoscopy;  Laterality: N/A;  3:00pm   EYE SURGERY N/A    Phreesia 03/18/2020   HERNIA REPAIR Right 07/2010   Dr. Blase   IR IMAGING GUIDED PORT INSERTION  02/09/2021   JOINT REPLACEMENT     LAPAROSCOPIC CHOLECYSTECTOMY  2017   at [facility]    POLYPECTOMY  09/14/2015   Procedure: POLYPECTOMY;  Surgeon: Lamar HERO Shaaron, MD;  Location: [facility];  Service: Endoscopy;;  ascending colon   right hip replacement  07/2010   went back in sept 2012 to fix   SHOULDER ARTHROSCOPY  2008   left   SPINE SURGERY N/A    Phreesia 03/18/2020   TOTAL HIP REVISION Right 12/17/2012   Procedure: RIGHT TOTAL HIP REVISION;  Surgeon: Donnice JONETTA Car, MD;  Location: [facility];  Service: Orthopedics;  Laterality: Right;   WRIST SURGERY Right 2011   open reduction right wrist.    Allergies: Bee venom, Acetaminophen , Norvasc  [amlodipine ], Hydralazine , Red dye #40 (allura red), Percocet [oxycodone -acetaminophen ], and Tyloxapol  Medications: Prior to Admission medications   Medication Sig Start Date End Date Taking? Authorizing Provider  acyclovir  (ZOVIRAX ) 800 MG tablet TAKE 1 TABLET BY MOUTH FIVE TIMES DAILY AS NEEDED 11/07/23   Patel, Rutwik K, MD  allopurinol  (ZYLOPRIM ) 100 MG tablet Take 2 tablets by mouth once daily 11/23/23   Kandala, Hyndavi, MD  EPINEPHrine  0.3 mg/0.3 mL IJ SOAJ injection INJECT 0.3 MLS INTO MUSCLE ONCE AS NEEDED 01/11/22   Golda Lynwood PARAS, MD  hydrOXYzine  (ATARAX ) 50 MG tablet Take 1 tablet by mouth three times daily as needed 07/31/23   Rogers Hai, MD  lidocaine -prilocaine  (EMLA ) cream APPLY TOPICALLY AS NEEDED TO ACCESS PORT 04/21/23   Rogers Hai, MD  megestrol  (MEGACE ) 400 MG/10ML suspension Take 10 mLs (400 mg total) by mouth 2 (two) times daily. 07/27/23   Rogers Hai, MD  metoprolol  tartrate (LOPRESSOR ) 25 MG tablet Take half tablet by mouthtwo times daily 04/20/23   Antonetta Rollene BRAVO, MD  mirtazapine  (REMERON ) 15 MG tablet Take 1 tablet (15 mg total) by mouth at bedtime. 10/21/23   Antonetta Rollene BRAVO, MD  momelotinib dihydrochloride  (OJJAARA ) 100 MG tablet Take 1 tablet (100 mg total) by mouth daily. 08/21/23   Katragadda, Sreedhar, MD  Multiple Vitamin (MULTIVITAMIN WITH MINERALS) TABS tablet Take 1  tablet by mouth daily. 10/25/22   Antonetta Rollene BRAVO, MD  Naldemedine Tosylate  (SYMPROIC ) 0.2 MG TABS Take 0.2 mg by mouth daily. 08/15/23   Rourk, Lamar HERO, MD  oxyCODONE  (ROXICODONE ) 15 MG immediate release tablet Take 15 mg by mouth every 6 (six) hours as needed. 01/19/22   [provider]  pantoprazole  (PROTONIX ) 40 MG tablet Take 1 tablet (40 mg total) by mouth 2 (two) times daily before a meal. 11/14/22   Rourk, Lamar HERO, MD  potassium chloride  (KLOR-CON ) 10 MEQ tablet Take 1 tablet by mouth once daily 05/23/23   Katragadda, Sreedhar, MD  pregabalin  (LYRICA ) 25 MG capsule Take 1 capsule (25 mg total) by mouth 3 (three) times daily. 07/13/23   Antonetta Rollene BRAVO, MD  prochlorperazine  (COMPAZINE ) 10 MG tablet TAKE 1 TABLET BY MOUTH EVERY 6 HOURS AS NEEDED FOR NAUSEA OR VOMITING 11/16/22   Rogers Hai, MD  temazepam  (RESTORIL ) 30 MG capsule Take 1 capsule (30 mg total) by mouth at bedtime as needed for  sleep. 06/29/23   Antonetta Rollene BRAVO, MD  Tenapanor HCl (IBSRELA) 50 MG TABS Take 50 mg by mouth 2 (two) times daily. 12/01/23   Rourk, Lamar HERO, MD  triamcinolone  ointment (KENALOG ) 0.5 % Apply topically daily. 02/27/23   Antonetta Rollene BRAVO, MD  trimethoprim  (TRIMPEX ) 100 MG tablet Take 1 tablet by mouth once daily 09/04/23   Matilda Senior, MD  UNABLE TO FIND Med Name: Covid Vaccine 2025 10/13/23   Antonetta Rollene BRAVO, MD  VOLTAREN  1 % GEL Apply 2 g topically 4 (four) times daily. Apply to affected area(s) up to 4 times daily, as needed, for pain 10/15/23   Antonetta Rollene BRAVO, MD     Family History  Problem Relation Age of Onset   Hypertension Mother    Stroke Mother    Colon cancer Neg Hx    Anesthesia problems Neg Hx    Hypotension Neg Hx    Malignant hyperthermia Neg Hx    Pseudochol deficiency Neg Hx    Gastric cancer Neg Hx    Esophageal cancer Neg Hx     Social History   Socioeconomic History   Marital status: Married    Spouse name: louis   Number of children: 4    Years of education: 12+   Highest education level: Some college, no degree  Occupational History   Occupation: audiological scientist  - retired   Occupation: non profit  Tobacco Use   Smoking status: Former    Current packs/day: 0.00    Average packs/day: 0.3 packs/day for 25.0 years (6.3 ttl pk-yrs)    Types: Cigarettes    Start date: 02/06/1978    Quit date: 02/07/2003    Years since quitting: 20.8   Smokeless tobacco: Never   Tobacco comments:    quit in 2004  Vaping Use   Vaping status: Never Used  Substance and Sexual Activity   Alcohol use: No   Drug use: No   Sexual activity: Yes    Birth control/protection: Surgical  Other Topics Concern   Not on file  Social History Narrative   Not on file   Social Drivers of Health   Financial Resource Strain: High Risk (02/06/2023)   Overall Financial Resource Strain (CARDIA)    Difficulty of Paying Living Expenses: Hard  Food Insecurity: No Food Insecurity (02/06/2023)   Hunger Vital Sign    Worried About Running Out of Food in the Last Year: Never true    Ran Out of Food in the Last Year: Never true  Transportation Needs: No Transportation Needs (02/06/2023)   PRAPARE - Administrator, Civil Service (Medical): No    Lack of Transportation (Non-Medical): No  Physical Activity: Patient Declined (02/06/2023)   Exercise Vital Sign    Days of Exercise per Week: Patient declined    Minutes of Exercise per Session: Patient declined  Stress: No Stress Concern Present (02/06/2023)   [employer] of Occupational Health - Occupational Stress Questionnaire    Feeling of Stress : Not at all  Social Connections: Moderately Isolated (02/06/2023)   Social Connection and Isolation Panel    Frequency of Communication with Friends and Family: More than three times a week    Frequency of Social Gatherings with Friends and Family: Once a week    Attends Religious Services: Never    Database Administrator or Organizations: No     Attends Banker Meetings: Never    Marital Status: Married    Review of Systems:  A 12 point ROS discussed and pertinent positives are indicated in the HPI above.  All other systems are negative.  Review of Systems  Vital Signs: There were no vitals taken for this visit.  Physical Exam   Labs:  CBC: Recent Labs    11/02/23 1407 11/09/23 1100 11/16/23 1409 11/30/23 0926  WBC 32.6* 41.1* 47.7* 54.7*  HGB 7.8* 7.3* 8.4* 7.6*  HCT 25.1* 24.0* 26.6* 25.3*  PLT 400 339 401* 357    COAGS: No results for input(s): INR, APTT in the last 8760 hours.  BMP: Recent Labs    08/24/23 1340 10/19/23 1423 11/09/23 1100 11/30/23 0926  NA 139 140 137 142  K 3.9 3.7 4.1 4.0  CL 112* 110 105 110  CO2 18* 20* 21* 21*  GLUCOSE 98 89 96 113*  BUN 29* 37* 28* 30*  CALCIUM  8.6* 8.1* 8.8* 8.6*  CREATININE 2.22* 3.39* 2.86* 2.99*  GFRNONAA 22* 13* 16* 16*    LIVER FUNCTION TESTS: Recent Labs    08/24/23 1340 10/19/23 1423 11/09/23 1100 11/30/23 0926  BILITOT 0.5 0.6 0.5 0.6  AST 29 36 37 43*  ALT 22 19 11 16   ALKPHOS 74 77 94 97  PROT 7.5 7.5 8.4* 8.5*  ALBUMIN 3.5 3.5 3.9 3.8    TUMOR MARKERS: No results for input(s): AFPTM, CEA, CA199, CHROMGRNA in the last 8760 hours.  Assessment and Plan:  MPL positive myeloproliferative neoplasm: Renee Waters, 78 year old female, presents today for an image-guided bone marrow biopsy.   Risks and benefits of this procedure were discussed with the patient and/or patient's family including, but not limited to bleeding, infection, damage to adjacent structures or low yield requiring additional tests.  All of the questions were answered and there is agreement to proceed. She has been NPO.  Consent signed and in chart.  Thank you for this interesting consult.  I greatly enjoyed meeting Renee Waters and look forward to participating in their care.  A copy of this report was sent to the requesting provider on  this date.  Electronically Signed: Warren Dais, AGACNP-BC 12/10/2023, 4:29 PM   I spent a total of  30 Minutes   in face to face in clinical consultation, greater than 50% of which was counseling/coordinating care for myeloproliferative neoplasm.

## 2023-12-11 ENCOUNTER — Other Ambulatory Visit: Payer: Self-pay

## 2023-12-11 ENCOUNTER — Encounter: Payer: Self-pay | Admitting: Radiology

## 2023-12-11 ENCOUNTER — Ambulatory Visit
Admission: RE | Admit: 2023-12-11 | Discharge: 2023-12-11 | Disposition: A | Source: Ambulatory Visit | Attending: Oncology

## 2023-12-11 DIAGNOSIS — D6489 Other specified anemias: Secondary | ICD-10-CM | POA: Diagnosis not present

## 2023-12-11 DIAGNOSIS — M47814 Spondylosis without myelopathy or radiculopathy, thoracic region: Secondary | ICD-10-CM | POA: Diagnosis not present

## 2023-12-11 DIAGNOSIS — D471 Chronic myeloproliferative disease: Secondary | ICD-10-CM | POA: Insufficient documentation

## 2023-12-11 DIAGNOSIS — C9200 Acute myeloblastic leukemia, not having achieved remission: Secondary | ICD-10-CM | POA: Diagnosis not present

## 2023-12-11 DIAGNOSIS — D72829 Elevated white blood cell count, unspecified: Secondary | ICD-10-CM | POA: Diagnosis not present

## 2023-12-11 DIAGNOSIS — Z59869 Financial insecurity, unspecified: Secondary | ICD-10-CM | POA: Insufficient documentation

## 2023-12-11 DIAGNOSIS — Z79899 Other long term (current) drug therapy: Secondary | ICD-10-CM | POA: Insufficient documentation

## 2023-12-11 DIAGNOSIS — C946 Myelodysplastic disease, not classified: Secondary | ICD-10-CM | POA: Diagnosis not present

## 2023-12-11 LAB — CBC WITH DIFFERENTIAL/PLATELET
Abs Immature Granulocytes: 4.4 K/uL — ABNORMAL HIGH (ref 0.00–0.07)
Basophils Absolute: 0 K/uL (ref 0.0–0.1)
Basophils Relative: 0 %
Eosinophils Absolute: 0 K/uL (ref 0.0–0.5)
Eosinophils Relative: 0 %
HCT: 26 % — ABNORMAL LOW (ref 36.0–46.0)
Hemoglobin: 8.2 g/dL — ABNORMAL LOW (ref 12.0–15.0)
Lymphocytes Relative: 22 %
Lymphs Abs: 12 K/uL — ABNORMAL HIGH (ref 0.7–4.0)
MCH: 29.3 pg (ref 26.0–34.0)
MCHC: 31.5 g/dL (ref 30.0–36.0)
MCV: 92.9 fL (ref 80.0–100.0)
Metamyelocytes Relative: 3 %
Monocytes Absolute: 7.6 K/uL — ABNORMAL HIGH (ref 0.1–1.0)
Monocytes Relative: 14 %
Myelocytes: 5 %
Neutro Abs: 22.9 K/uL — ABNORMAL HIGH (ref 1.7–7.7)
Neutrophils Relative %: 42 %
Other: 14 %
Platelets: 380 K/uL (ref 150–400)
RBC: 2.8 MIL/uL — ABNORMAL LOW (ref 3.87–5.11)
RDW: 21 % — ABNORMAL HIGH (ref 11.5–15.5)
WBC: 54.5 K/uL (ref 4.0–10.5)
nRBC: 1.9 % — ABNORMAL HIGH (ref 0.0–0.2)

## 2023-12-11 LAB — PATHOLOGIST SMEAR REVIEW

## 2023-12-11 MED ORDER — MIDAZOLAM HCL 2 MG/2ML IJ SOLN
INTRAMUSCULAR | Status: AC
  Filled 2023-12-11: qty 4

## 2023-12-11 MED ORDER — FENTANYL CITRATE (PF) 100 MCG/2ML IJ SOLN
INTRAMUSCULAR | Status: AC
  Filled 2023-12-11: qty 4

## 2023-12-11 MED ORDER — MIDAZOLAM HCL (PF) 2 MG/2ML IJ SOLN
INTRAMUSCULAR | Status: AC | PRN
  Administered 2023-12-11: 1 mg via INTRAVENOUS
  Administered 2023-12-11 (×3): .5 mg via INTRAVENOUS

## 2023-12-11 MED ORDER — HEPARIN SOD (PORK) LOCK FLUSH 100 UNIT/ML IV SOLN
INTRAVENOUS | Status: AC
  Filled 2023-12-11: qty 5

## 2023-12-11 MED ORDER — SODIUM CHLORIDE 0.9 % IV SOLN: INTRAVENOUS | Status: DC

## 2023-12-11 MED ORDER — FENTANYL CITRATE (PF) 100 MCG/2ML IJ SOLN
INTRAMUSCULAR | Status: AC | PRN
  Administered 2023-12-11: 25 ug via INTRAVENOUS
  Administered 2023-12-11: 50 ug via INTRAVENOUS
  Administered 2023-12-11 (×2): 25 ug via INTRAVENOUS

## 2023-12-11 MED ORDER — LIDOCAINE HCL (PF) 1 % IJ SOLN
10.0000 mL | Freq: Once | INTRAMUSCULAR | Status: DC
  Filled 2023-12-11: qty 10

## 2023-12-11 NOTE — Procedures (Signed)
 Interventional Radiology Procedure Note  Procedure: CT guided bone marrow aspiration and biopsy  Complications: None  EBL: < 10 mL  Findings: Aspirate and core biopsy performed of bone marrow in right iliac bone.  Plan: Bedrest supine x 1 hrs  Ericha Whittingham T. Fredia Sorrow, M.D Pager:  [phone]

## 2023-12-12 ENCOUNTER — Telehealth: Payer: Self-pay | Admitting: Oncology

## 2023-12-12 ENCOUNTER — Ambulatory Visit: Payer: Medicare Other | Attending: Internal Medicine

## 2023-12-12 DIAGNOSIS — I351 Nonrheumatic aortic (valve) insufficiency: Secondary | ICD-10-CM | POA: Diagnosis not present

## 2023-12-12 LAB — ECHOCARDIOGRAM COMPLETE
AR max vel: 2.13 cm2
AV Peak grad: 15.2 mmHg
AV Vena cont: 0.5 cm
Ao pk vel: 1.95 m/s
Area-P 1/2: 2.52 cm2
Calc EF: 73 %
P 1/2 time: 608 ms
S' Lateral: 2.7 cm
Single Plane A2C EF: 78.8 %
Single Plane A4C EF: 68.1 %

## 2023-12-12 NOTE — Telephone Encounter (Signed)
 Re: bone marrow results  Received phone call from pathology that bone marrow results returned and showed greater than 30% blasts in her most recent bone marrow sample.  She has sent it off for flow cytometry and additional stains so formal report is not back.  Will relay message to Dr. Davonna for plan moving forward.  Delon Hope, NP 12/12/2023 3:33 PM

## 2023-12-13 ENCOUNTER — Other Ambulatory Visit: Payer: Self-pay | Admitting: Oncology

## 2023-12-13 DIAGNOSIS — F411 Generalized anxiety disorder: Secondary | ICD-10-CM | POA: Diagnosis not present

## 2023-12-13 DIAGNOSIS — F331 Major depressive disorder, recurrent, moderate: Secondary | ICD-10-CM | POA: Diagnosis not present

## 2023-12-13 LAB — SURGICAL PATHOLOGY

## 2023-12-13 NOTE — Progress Notes (Signed)
 Hematology progress note  I called Mrs. Renee Waters to inform her of the flow cytometry results that are consistent with acute myeloid leukemia.  I discussed diagnosis with her briefly and that this requires chemotherapy and care at a tertiary center.  Patient has previously seen Dr. Karie Monte at [facility]  Patient is agreeable to see him.  I called Dr. Karie Monte and informed him of this diagnosis.  His office will get in touch with patient to make an appointment next week.  In the meantime, will continue transfusion support as needed for the patient here at [facility].  All the questions and concerns were answered in detail.  Mickiel Dry, MD Hematology/Oncology [facility]

## 2023-12-14 ENCOUNTER — Telehealth: Payer: Self-pay | Admitting: Pharmacy Technician

## 2023-12-14 NOTE — Telephone Encounter (Signed)
 Oral Oncology Patient Advocate Encounter   Was successful in securing patient a $7000 grant from [employer] to provide copayment coverage for OJJAARA .  This will keep the out of pocket expense at $0.    The billing information is as follows and has been shared with [facility].   Member ID: [id number] Group ID: [facility] RxBin: [id number] PCN: [id number] Dates of Eligibility: 12/14/2023 through 12/12/2024  Fund name:  Myeloproliferative Neoplasms.     Eiden Bagot (Patty) Chet Burnet, CPhT  [facility], Zelda Salmon, [facility]cology - Oral Chemotherapy Patient Advocate Specialist III Phone: [phone]  Fax: [phone]

## 2023-12-15 ENCOUNTER — Ambulatory Visit: Payer: Self-pay

## 2023-12-15 ENCOUNTER — Other Ambulatory Visit (HOSPITAL_COMMUNITY): Payer: Self-pay

## 2023-12-15 NOTE — Telephone Encounter (Signed)
 FYI Only or Action Required?: Action required by provider: medication request.  Patient was last seen in primary care on 10/13/2023 by Renee Waters BRAVO, MD.  Called Nurse Triage reporting Depression and Anxiety.  Symptoms began several months ago.  Interventions attempted: Nothing.  Symptoms are: unchanged.  Triage Disposition: See Physician Within 24 Hours  Patient/caregiver understands and will follow disposition?: No, wishes to speak with PCP   Copied from CRM [id number]. Topic: Clinical - Red Word Triage >> Dec 15, 2023  3:18 PM [name] wrote: Red Word that prompted transfer to Nurse Triage: Pt says she was recently diagnosed with acute lukemia. She is crying and very sad about her diagnosis. She is asking to speak with someone about how much this is taking a toll on her mental Reason for Disposition  [1] Depression AND [2] getting worse (e.g., sleeping poorly, less able to do activities of daily living)  Answer Assessment - Initial Assessment Questions Patient states that she has a therapist who told her she may need some medicine to help with her anxiety and depression. She states she needs something to help calm her like before she got this news. Patient is tearful on the phone. RN attempted to schedule an appt and she stated she doesn't have time for appts with upcoming treatment. She'd like for Dr. Antonetta to call her something in.    1. CONCERN: What happened that made you call today?     Patient was recently diagnosed with lukemia and is having difficulty processing.  2. DEPRESSION SYMPTOM SCREENING: How are you feeling overall? (e.g., decreased energy, increased sleeping or difficulty sleeping, difficulty concentrating, feelings of sadness, guilt, hopelessness, or worthlessness)     Severe sadness 3. RISK OF HARM - SUICIDAL IDEATION:  Do you ever have thoughts of hurting or killing yourself?  (e.g., yes, no, no but preoccupation with thoughts about death)      4. RISK  OF HARM - HOMICIDAL IDEATION:  Do you ever have thoughts of hurting or killing someone else?  (e.g., yes, no, no but preoccupation with thoughts about death)      5. FUNCTIONAL IMPAIRMENT: How have things been going for you overall? Have you had more difficulty than usual doing your normal daily activities?  (e.g., better, same, worse; self-care, school, work, interactions)     yes 6. SUPPORT: Who is with you now? Who do you live with? Do you have family or friends who you can talk to?      Family and they were with her during this call.  7. THERAPIST: Do you have a counselor or therapist? If Yes, ask: What is their name?     Yes 8. STRESSORS: Has there been any new stress or recent changes in your life?     New diagnosis.  Protocols used: Depression-A-AH

## 2023-12-19 ENCOUNTER — Inpatient Hospital Stay: Admitting: Oncology

## 2023-12-19 ENCOUNTER — Inpatient Hospital Stay

## 2023-12-19 DIAGNOSIS — R799 Abnormal finding of blood chemistry, unspecified: Secondary | ICD-10-CM | POA: Diagnosis not present

## 2023-12-19 DIAGNOSIS — D471 Chronic myeloproliferative disease: Secondary | ICD-10-CM | POA: Diagnosis not present

## 2023-12-19 DIAGNOSIS — C9200 Acute myeloblastic leukemia, not having achieved remission: Secondary | ICD-10-CM | POA: Diagnosis not present

## 2023-12-20 ENCOUNTER — Other Ambulatory Visit: Payer: Self-pay | Admitting: Oncology

## 2023-12-20 ENCOUNTER — Other Ambulatory Visit: Payer: Self-pay | Admitting: Internal Medicine

## 2023-12-20 ENCOUNTER — Other Ambulatory Visit: Payer: Self-pay | Admitting: Family Medicine

## 2023-12-21 ENCOUNTER — Encounter (HOSPITAL_COMMUNITY): Payer: Self-pay

## 2023-12-21 ENCOUNTER — Ambulatory Visit: Payer: Self-pay | Admitting: Internal Medicine

## 2023-12-26 ENCOUNTER — Other Ambulatory Visit: Payer: Self-pay | Admitting: Family Medicine

## 2023-12-26 DIAGNOSIS — N2889 Other specified disorders of kidney and ureter: Secondary | ICD-10-CM | POA: Diagnosis not present

## 2023-12-26 DIAGNOSIS — C9200 Acute myeloblastic leukemia, not having achieved remission: Secondary | ICD-10-CM | POA: Diagnosis not present

## 2023-12-26 DIAGNOSIS — Z01818 Encounter for other preprocedural examination: Secondary | ICD-10-CM | POA: Diagnosis not present

## 2023-12-26 DIAGNOSIS — N184 Chronic kidney disease, stage 4 (severe): Secondary | ICD-10-CM | POA: Diagnosis not present

## 2023-12-27 ENCOUNTER — Other Ambulatory Visit: Payer: Self-pay | Admitting: Oncology

## 2023-12-27 ENCOUNTER — Other Ambulatory Visit: Payer: Self-pay

## 2023-12-27 DIAGNOSIS — I491 Atrial premature depolarization: Secondary | ICD-10-CM | POA: Diagnosis not present

## 2023-12-27 DIAGNOSIS — D471 Chronic myeloproliferative disease: Secondary | ICD-10-CM

## 2023-12-27 DIAGNOSIS — C9200 Acute myeloblastic leukemia, not having achieved remission: Secondary | ICD-10-CM | POA: Diagnosis not present

## 2023-12-27 DIAGNOSIS — C9590 Leukemia, unspecified not having achieved remission: Secondary | ICD-10-CM | POA: Diagnosis not present

## 2023-12-28 ENCOUNTER — Other Ambulatory Visit: Payer: Self-pay

## 2023-12-28 DIAGNOSIS — C9200 Acute myeloblastic leukemia, not having achieved remission: Secondary | ICD-10-CM | POA: Diagnosis not present

## 2023-12-28 MED ORDER — MOMELOTINIB DIHYDROCHLORIDE 100 MG PO TABS
100.0000 mg | ORAL_TABLET | Freq: Every day | ORAL | 3 refills | Status: AC
  Filled 2023-12-28 – 2024-02-28 (×11): qty 30, 30d supply, fill #0

## 2023-12-29 ENCOUNTER — Telehealth: Payer: Self-pay

## 2023-12-29 DIAGNOSIS — R509 Fever, unspecified: Secondary | ICD-10-CM | POA: Diagnosis not present

## 2023-12-29 DIAGNOSIS — C9200 Acute myeloblastic leukemia, not having achieved remission: Secondary | ICD-10-CM | POA: Diagnosis not present

## 2023-12-29 DIAGNOSIS — C9100 Acute lymphoblastic leukemia not having achieved remission: Secondary | ICD-10-CM | POA: Diagnosis not present

## 2023-12-29 MED ORDER — TEMAZEPAM 30 MG PO CAPS: 30.0000 mg | ORAL_CAPSULE | Freq: Every evening | ORAL | 5 refills | Status: AC | PRN

## 2023-12-29 NOTE — Telephone Encounter (Signed)
 Pt needs refill of Temazepam  30mg 

## 2023-12-29 NOTE — Addendum Note (Signed)
 Addended by: ANTONETTA ROLLENE BRAVO on: 12/29/2023 05:02 PM   Modules accepted: Orders

## 2023-12-29 NOTE — Telephone Encounter (Signed)
 Medication refilled x 6 months, attempted to speak with pt and left a message on her machine

## 2023-12-30 DIAGNOSIS — C9200 Acute myeloblastic leukemia, not having achieved remission: Secondary | ICD-10-CM | POA: Diagnosis not present

## 2023-12-31 DIAGNOSIS — C9200 Acute myeloblastic leukemia, not having achieved remission: Secondary | ICD-10-CM | POA: Diagnosis not present

## 2024-01-01 ENCOUNTER — Other Ambulatory Visit: Payer: Self-pay

## 2024-01-01 DIAGNOSIS — C9200 Acute myeloblastic leukemia, not having achieved remission: Secondary | ICD-10-CM | POA: Diagnosis not present

## 2024-01-02 DIAGNOSIS — C9200 Acute myeloblastic leukemia, not having achieved remission: Secondary | ICD-10-CM | POA: Diagnosis not present

## 2024-01-03 ENCOUNTER — Other Ambulatory Visit: Payer: Self-pay

## 2024-01-03 ENCOUNTER — Other Ambulatory Visit (HOSPITAL_COMMUNITY): Payer: Self-pay

## 2024-01-03 DIAGNOSIS — C9200 Acute myeloblastic leukemia, not having achieved remission: Secondary | ICD-10-CM | POA: Diagnosis not present

## 2024-01-04 DIAGNOSIS — N1339 Other hydronephrosis: Secondary | ICD-10-CM | POA: Diagnosis not present

## 2024-01-04 DIAGNOSIS — C9200 Acute myeloblastic leukemia, not having achieved remission: Secondary | ICD-10-CM | POA: Diagnosis not present

## 2024-01-05 DIAGNOSIS — C9200 Acute myeloblastic leukemia, not having achieved remission: Secondary | ICD-10-CM | POA: Diagnosis not present

## 2024-01-06 DIAGNOSIS — K5903 Drug induced constipation: Secondary | ICD-10-CM | POA: Diagnosis not present

## 2024-01-06 DIAGNOSIS — C9200 Acute myeloblastic leukemia, not having achieved remission: Secondary | ICD-10-CM | POA: Diagnosis not present

## 2024-01-06 DIAGNOSIS — R918 Other nonspecific abnormal finding of lung field: Secondary | ICD-10-CM | POA: Diagnosis not present

## 2024-01-07 DIAGNOSIS — C9200 Acute myeloblastic leukemia, not having achieved remission: Secondary | ICD-10-CM | POA: Diagnosis not present

## 2024-01-08 ENCOUNTER — Inpatient Hospital Stay

## 2024-01-08 DIAGNOSIS — J8489 Other specified interstitial pulmonary diseases: Secondary | ICD-10-CM | POA: Diagnosis not present

## 2024-01-08 DIAGNOSIS — C9200 Acute myeloblastic leukemia, not having achieved remission: Secondary | ICD-10-CM | POA: Diagnosis not present

## 2024-01-08 DIAGNOSIS — K922 Gastrointestinal hemorrhage, unspecified: Secondary | ICD-10-CM | POA: Diagnosis not present

## 2024-01-08 DIAGNOSIS — R918 Other nonspecific abnormal finding of lung field: Secondary | ICD-10-CM | POA: Diagnosis not present

## 2024-01-09 ENCOUNTER — Other Ambulatory Visit: Payer: Self-pay

## 2024-01-09 DIAGNOSIS — R051 Acute cough: Secondary | ICD-10-CM | POA: Diagnosis not present

## 2024-01-09 DIAGNOSIS — R509 Fever, unspecified: Secondary | ICD-10-CM | POA: Diagnosis not present

## 2024-01-09 DIAGNOSIS — C9200 Acute myeloblastic leukemia, not having achieved remission: Secondary | ICD-10-CM | POA: Diagnosis not present

## 2024-01-09 DIAGNOSIS — K921 Melena: Secondary | ICD-10-CM | POA: Diagnosis not present

## 2024-01-10 DIAGNOSIS — K921 Melena: Secondary | ICD-10-CM | POA: Diagnosis not present

## 2024-01-10 DIAGNOSIS — C9200 Acute myeloblastic leukemia, not having achieved remission: Secondary | ICD-10-CM | POA: Diagnosis not present

## 2024-01-10 DIAGNOSIS — D696 Thrombocytopenia, unspecified: Secondary | ICD-10-CM | POA: Diagnosis not present

## 2024-01-11 ENCOUNTER — Inpatient Hospital Stay

## 2024-01-11 DIAGNOSIS — K31811 Angiodysplasia of stomach and duodenum with bleeding: Secondary | ICD-10-CM | POA: Diagnosis not present

## 2024-01-11 DIAGNOSIS — K921 Melena: Secondary | ICD-10-CM | POA: Diagnosis not present

## 2024-01-11 DIAGNOSIS — C9200 Acute myeloblastic leukemia, not having achieved remission: Secondary | ICD-10-CM | POA: Diagnosis not present

## 2024-01-12 DIAGNOSIS — C9200 Acute myeloblastic leukemia, not having achieved remission: Secondary | ICD-10-CM | POA: Diagnosis not present

## 2024-01-12 DIAGNOSIS — B348 Other viral infections of unspecified site: Secondary | ICD-10-CM | POA: Diagnosis not present

## 2024-01-13 DIAGNOSIS — C9200 Acute myeloblastic leukemia, not having achieved remission: Secondary | ICD-10-CM | POA: Diagnosis not present

## 2024-01-14 DIAGNOSIS — C9200 Acute myeloblastic leukemia, not having achieved remission: Secondary | ICD-10-CM | POA: Diagnosis not present

## 2024-01-15 ENCOUNTER — Other Ambulatory Visit: Payer: Self-pay

## 2024-01-15 ENCOUNTER — Inpatient Hospital Stay: Attending: Hematology

## 2024-01-15 DIAGNOSIS — C9200 Acute myeloblastic leukemia, not having achieved remission: Secondary | ICD-10-CM | POA: Diagnosis not present

## 2024-01-16 ENCOUNTER — Other Ambulatory Visit: Payer: Self-pay

## 2024-01-16 DIAGNOSIS — C9200 Acute myeloblastic leukemia, not having achieved remission: Secondary | ICD-10-CM | POA: Diagnosis not present

## 2024-01-16 NOTE — Progress Notes (Signed)
 Case Management Adult Assessment  CSN: [id number] DOB: September 29, 1945 Service: Leukemia Location: C626/A   Info & Contacts Assessment Completed: In-person interview with Patient The patient's status at this time is:: Able to communicate Prior to admission, patient resided at: Private residence Prior to admission, patient lived with : Spouse/Significant Other The patient's decision maker is:: Patient At discharge, will patient return to prior residence: Yes Barriers to education: No barriers   Extended Emergency Contact Information Primary Emergency Contact: Mwangi,[name]  [place] Home Phone: [phone] Mobile Phone: [phone] Relation: Spouse Secondary Emergency Contact: Sanders,[place] Mobile Phone: [phone] Relation: Daughter  Assessment Is this patient at baseline?: No Was patient independent with ADLs prior to admission?: Yes Was patient independent with mobility prior to admission?: Yes Does this patient have or need any DME?: Yes Patient has the following DME: Vannie, Wheelchair, [employer], Shower chair, Bedside commode Does this patient have or need any personal care services?: No   Social Does patient have a mental health diagnosis?: No Does patient have an acute substance use issue?: No   Discharge How will patient obtain prescription meds at discharge:: Medicare Part D Type of Payer Source:: Medicare How will this patient obtain follow-up care after discharge?: Undetermined How will this patient reach the discharge destination?: Family/Friends Is this a Chronic Dialysis patient?: No Patient Discharge Goals of Care: Increased Mobility/Self Care, Safety, Not to Return to the Hospital  Discharge Education Discharge Plan Discussed With:: Patient Education Readiness:: Acceptance Education Method:: Explanation Education Response:: Verbalizes Understanding    CM met with patient at bedside. CM introduced self and role. Patient verified name and  DOB. Patient agreed to conversation.   Patient validated facesheet information is accurate. Patient states she lives at home with spouse, independent with ADLs, and has a cane, walker, wheelchair, bedside commode, and shower chair. Family can provide transport at dc.   CM spoke with patient about home health PT. Patient prefers [facility].  CM spoke with [facility] with [facility] [phone]) and referral has been placed along with PT orders.   Patient denies any questions or concerns at this time.   Case Management Coordination Status: Coordination Complete    Anticipated Discharge Location: Home with Home Health  If Plan A discharging location is not feasible: Potential Plan B: Home with Home Health         Heather A. Haytas BSN, RN Case Manager  [phone]

## 2024-01-17 ENCOUNTER — Other Ambulatory Visit: Payer: Self-pay

## 2024-01-17 ENCOUNTER — Other Ambulatory Visit (HOSPITAL_COMMUNITY): Payer: Self-pay

## 2024-01-17 DIAGNOSIS — Z1621 Resistance to vancomycin: Secondary | ICD-10-CM | POA: Diagnosis not present

## 2024-01-17 DIAGNOSIS — K921 Melena: Secondary | ICD-10-CM | POA: Diagnosis not present

## 2024-01-17 DIAGNOSIS — A491 Streptococcal infection, unspecified site: Secondary | ICD-10-CM | POA: Diagnosis not present

## 2024-01-17 DIAGNOSIS — C9200 Acute myeloblastic leukemia, not having achieved remission: Secondary | ICD-10-CM | POA: Diagnosis not present

## 2024-01-17 NOTE — Care Plan (Signed)
  Problem: Health Behavior: Goal: MCB Ability to state ways to decrease the risk of falls will be met by discharge Description: Ability to state ways to decrease the risk of falls will improve by discharge Outcome: Progressing

## 2024-01-18 ENCOUNTER — Inpatient Hospital Stay: Attending: Hematology

## 2024-01-18 DIAGNOSIS — C9200 Acute myeloblastic leukemia, not having achieved remission: Secondary | ICD-10-CM | POA: Diagnosis not present

## 2024-01-18 DIAGNOSIS — D62 Acute posthemorrhagic anemia: Secondary | ICD-10-CM | POA: Diagnosis not present

## 2024-01-18 DIAGNOSIS — N390 Urinary tract infection, site not specified: Secondary | ICD-10-CM | POA: Diagnosis not present

## 2024-01-18 DIAGNOSIS — G4700 Insomnia, unspecified: Secondary | ICD-10-CM | POA: Diagnosis not present

## 2024-01-18 DIAGNOSIS — D61818 Other pancytopenia: Secondary | ICD-10-CM | POA: Diagnosis not present

## 2024-01-18 DIAGNOSIS — K921 Melena: Secondary | ICD-10-CM | POA: Diagnosis not present

## 2024-01-18 DIAGNOSIS — T451X5A Adverse effect of antineoplastic and immunosuppressive drugs, initial encounter: Secondary | ICD-10-CM | POA: Diagnosis not present

## 2024-01-18 DIAGNOSIS — N184 Chronic kidney disease, stage 4 (severe): Secondary | ICD-10-CM | POA: Diagnosis not present

## 2024-01-18 DIAGNOSIS — K5651 Intestinal adhesions [bands], with partial obstruction: Secondary | ICD-10-CM | POA: Diagnosis not present

## 2024-01-18 DIAGNOSIS — R600 Localized edema: Secondary | ICD-10-CM | POA: Diagnosis not present

## 2024-01-18 DIAGNOSIS — R338 Other retention of urine: Secondary | ICD-10-CM | POA: Diagnosis not present

## 2024-01-18 DIAGNOSIS — C9250 Acute myelomonocytic leukemia, not having achieved remission: Secondary | ICD-10-CM | POA: Diagnosis not present

## 2024-01-18 DIAGNOSIS — R630 Anorexia: Secondary | ICD-10-CM | POA: Diagnosis not present

## 2024-01-18 DIAGNOSIS — K565 Intestinal adhesions [bands], unspecified as to partial versus complete obstruction: Secondary | ICD-10-CM | POA: Diagnosis not present

## 2024-01-18 DIAGNOSIS — M5459 Other low back pain: Secondary | ICD-10-CM | POA: Diagnosis not present

## 2024-01-19 DIAGNOSIS — R630 Anorexia: Secondary | ICD-10-CM | POA: Diagnosis not present

## 2024-01-19 DIAGNOSIS — N184 Chronic kidney disease, stage 4 (severe): Secondary | ICD-10-CM | POA: Diagnosis not present

## 2024-01-19 DIAGNOSIS — D62 Acute posthemorrhagic anemia: Secondary | ICD-10-CM | POA: Diagnosis not present

## 2024-01-19 DIAGNOSIS — N390 Urinary tract infection, site not specified: Secondary | ICD-10-CM | POA: Diagnosis not present

## 2024-01-19 DIAGNOSIS — R338 Other retention of urine: Secondary | ICD-10-CM | POA: Diagnosis not present

## 2024-01-19 DIAGNOSIS — D61818 Other pancytopenia: Secondary | ICD-10-CM | POA: Diagnosis not present

## 2024-01-19 DIAGNOSIS — K921 Melena: Secondary | ICD-10-CM | POA: Diagnosis not present

## 2024-01-19 DIAGNOSIS — C9200 Acute myeloblastic leukemia, not having achieved remission: Secondary | ICD-10-CM | POA: Diagnosis not present

## 2024-01-19 DIAGNOSIS — C9250 Acute myelomonocytic leukemia, not having achieved remission: Secondary | ICD-10-CM | POA: Diagnosis not present

## 2024-01-19 DIAGNOSIS — T451X5A Adverse effect of antineoplastic and immunosuppressive drugs, initial encounter: Secondary | ICD-10-CM | POA: Diagnosis not present

## 2024-01-19 DIAGNOSIS — R600 Localized edema: Secondary | ICD-10-CM | POA: Diagnosis not present

## 2024-01-19 DIAGNOSIS — G4700 Insomnia, unspecified: Secondary | ICD-10-CM | POA: Diagnosis not present

## 2024-01-19 DIAGNOSIS — M5459 Other low back pain: Secondary | ICD-10-CM | POA: Diagnosis not present

## 2024-01-20 DIAGNOSIS — N390 Urinary tract infection, site not specified: Secondary | ICD-10-CM | POA: Diagnosis not present

## 2024-01-20 DIAGNOSIS — D61818 Other pancytopenia: Secondary | ICD-10-CM | POA: Diagnosis not present

## 2024-01-20 DIAGNOSIS — D62 Acute posthemorrhagic anemia: Secondary | ICD-10-CM | POA: Diagnosis not present

## 2024-01-20 DIAGNOSIS — C9250 Acute myelomonocytic leukemia, not having achieved remission: Secondary | ICD-10-CM | POA: Diagnosis not present

## 2024-01-20 DIAGNOSIS — R630 Anorexia: Secondary | ICD-10-CM | POA: Diagnosis not present

## 2024-01-20 DIAGNOSIS — K921 Melena: Secondary | ICD-10-CM | POA: Diagnosis not present

## 2024-01-20 DIAGNOSIS — R338 Other retention of urine: Secondary | ICD-10-CM | POA: Diagnosis not present

## 2024-01-20 DIAGNOSIS — M5459 Other low back pain: Secondary | ICD-10-CM | POA: Diagnosis not present

## 2024-01-20 DIAGNOSIS — T451X5A Adverse effect of antineoplastic and immunosuppressive drugs, initial encounter: Secondary | ICD-10-CM | POA: Diagnosis not present

## 2024-01-20 DIAGNOSIS — N184 Chronic kidney disease, stage 4 (severe): Secondary | ICD-10-CM | POA: Diagnosis not present

## 2024-01-20 DIAGNOSIS — R140 Abdominal distension (gaseous): Secondary | ICD-10-CM | POA: Diagnosis not present

## 2024-01-20 DIAGNOSIS — R600 Localized edema: Secondary | ICD-10-CM | POA: Diagnosis not present

## 2024-01-21 DIAGNOSIS — M5459 Other low back pain: Secondary | ICD-10-CM | POA: Diagnosis not present

## 2024-01-21 DIAGNOSIS — K921 Melena: Secondary | ICD-10-CM | POA: Diagnosis not present

## 2024-01-21 DIAGNOSIS — T451X5A Adverse effect of antineoplastic and immunosuppressive drugs, initial encounter: Secondary | ICD-10-CM | POA: Diagnosis not present

## 2024-01-21 DIAGNOSIS — G4700 Insomnia, unspecified: Secondary | ICD-10-CM | POA: Diagnosis not present

## 2024-01-21 DIAGNOSIS — D61818 Other pancytopenia: Secondary | ICD-10-CM | POA: Diagnosis not present

## 2024-01-21 DIAGNOSIS — C9250 Acute myelomonocytic leukemia, not having achieved remission: Secondary | ICD-10-CM | POA: Diagnosis not present

## 2024-01-21 DIAGNOSIS — N184 Chronic kidney disease, stage 4 (severe): Secondary | ICD-10-CM | POA: Diagnosis not present

## 2024-01-21 DIAGNOSIS — D62 Acute posthemorrhagic anemia: Secondary | ICD-10-CM | POA: Diagnosis not present

## 2024-01-21 DIAGNOSIS — R600 Localized edema: Secondary | ICD-10-CM | POA: Diagnosis not present

## 2024-01-21 DIAGNOSIS — N390 Urinary tract infection, site not specified: Secondary | ICD-10-CM | POA: Diagnosis not present

## 2024-01-21 DIAGNOSIS — R338 Other retention of urine: Secondary | ICD-10-CM | POA: Diagnosis not present

## 2024-01-21 DIAGNOSIS — R630 Anorexia: Secondary | ICD-10-CM | POA: Diagnosis not present

## 2024-01-22 ENCOUNTER — Inpatient Hospital Stay

## 2024-01-22 ENCOUNTER — Other Ambulatory Visit: Payer: Self-pay

## 2024-01-25 ENCOUNTER — Other Ambulatory Visit: Payer: Self-pay

## 2024-01-25 ENCOUNTER — Inpatient Hospital Stay

## 2024-01-29 ENCOUNTER — Other Ambulatory Visit (HOSPITAL_COMMUNITY): Payer: Self-pay

## 2024-01-29 ENCOUNTER — Other Ambulatory Visit: Payer: Self-pay

## 2024-01-30 ENCOUNTER — Ambulatory Visit: Admitting: Urology

## 2024-01-30 ENCOUNTER — Other Ambulatory Visit: Payer: Self-pay

## 2024-02-05 ENCOUNTER — Encounter: Payer: Self-pay | Admitting: *Deleted

## 2024-02-05 ENCOUNTER — Other Ambulatory Visit: Payer: Self-pay

## 2024-02-05 ENCOUNTER — Other Ambulatory Visit (HOSPITAL_COMMUNITY): Payer: Self-pay

## 2024-02-07 ENCOUNTER — Other Ambulatory Visit: Payer: Self-pay

## 2024-02-11 NOTE — Progress Notes (Signed)
 Leukemia Progress Note  Background: Renee Waters is a 79 y.o. 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  Patient has had a prolonged hospitalization due to several complications, including GI bleeding from small bowel AVM as well as SBO status post ex-lap, prolonged postop ileus, appendicitis, and pain management.    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 PCA rate increased. This morning at 5am, team notified again of ongoing pain, now 10/10.  This morning, patient not having active abdominal pain on exam, but reporting significant, severe, intermittent pain overnight that has moved to epigastric region, right flank area, and RLQ. Worse and different in nature that prior. Continued to have several bowel movements overnight. Darker in color, but still more brown as opposed to black in color.  PCA attempts: 12 attempts in past 24 hours as of this morning.  Will be getting transfusions today.  She is still getting antibiotics for her appendicitis treatment.  OBJECTIVE: Vital Signs: Afebrile, BP/HR stable, on room air Temp:  [97.2 F (36.2 C)-99.1 F (37.3 C)] 98.1 F (36.7 C) Heart Rate:  [70-93] 78 Resp:  [16-22] 18 BP: (116-168)/(68-92) 140/82  Physical Exam: 02/11/2024 General: Tearful this morning HEENT: Atraumatic, normocephalic. EOMI, anicteric sclera. No evidence of bleeding.  CV: Extremities well perfused.  No lower extremity  edema. Pulmonary: No increased work of breathing, no stridor.  Some mild crackles at bases. Abdomen: No-distended, soft. No rebound or guarding.  Increased RLQ tenderness on palpation, worse than prior, now also in epigastric region and posterior right flank area. Skin: Tender nodule on the left sacral area at prior bone marrow biopsy site, not fluctuant, red, or warm appearing (picture in media tab) Neuro: CN II-XII grossly intact. Moves all extremities.   Labs: CBC: WBC 1.1,  ANC 0.60, Hgb 7.4, plts 20 CMP: Na 140, K 3.9, Mg 1.8, Cr 0.98, phos 5.6 Uric acid 2.0, LDH 135  CBC:  Results from last 7 days  Lab Units 02/11/24 0519 02/10/24 1508 02/10/24 0530 02/09/24 1903 02/09/24 0136  WHITE BLOOD CELL COUNT 10*3/uL 1.00* 1.20* 1.10*  --  1.20*  HEMOGLOBIN g/dL 6.2* 7.5* 7.4* 8.1* 6.5*  HEMATOCRIT % 17.6* 21.4* 21.0* 23.0* 18.7*  PLATELET COUNT 10*3/uL 26* 25* 20*  --  22*    CMP:  Results from last 7 days  Lab Units 02/11/24 0519 02/10/24 0530 02/09/24 0136 02/08/24 0440  SODIUM mmol/L 140 139 137 139  POTASSIUM mmol/L 3.9 3.8 3.7 4.0  CHLORIDE mmol/L 111* 111* 110* 112*  CO2 mmol/L 24 24 24 22   BUN mg/dL 27* 24 25 25   CREATININE mg/dL 9.01 9.01 9.10 9.10  CALCIUM  mg/dL 7.9* 7.7* 7.4* 7.7*  MAGNESIUM  mg/dL 1.8* 1.7* 2.2 1.6*  PHOSPHORUS mg/dL 5.6* 4.5 4.6 4.2  BILIRUBIN TOTAL mg/dL 0.6 0.9 0.7 0.7  AST U/L 8* 8* 9* 11*  ALT U/L 8 8 9 11   TOTAL PROTEIN g/dL 5.7* 6.0* 5.8* 6.2*  ALBUMIN g/dL 2.7* 2.7* 2.7* 2.7*  ANION GAP mmol/L 5* 4* 3* 5*    ASSESSMENT/PLAN:  Renee Waters is a 79 y.o. 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  Patient has had a prolonged hospitalization due to several complications, including GI bleeding from small bowel AVM as well as SBO status  post ex-lap, prolonged postop ileus, appendicitis, and pain management.    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  Biopsy in 2022 consistent with peripheral fibrotic myelofibrosis.  Had a normal karyotype at that time, MPL, ASXL1, TET2, and EZH2+.  - AML iwith ASXL1, CBL, STAG2, KRAS, MPL, TET2, EZH2 VUS mutations and RUNX1::TACC1 fusion. -- Was initiated on rux in 02/2021 and had progressive anemia, fatigue, slight worsening so was switched to momelotinib 01/2022 but was on reduced dose of 100mg /day due to pruritus on higher dose -- Now has progressed to AML on bone marrow  -- Peripheral blood myeloid NGS done here revealed ASXL1, STAG2, KRAS, CBL, MPL and TET2 mutations along with RUNX1::TACC1 fusion and EZH2 mutation (VUS)  -- Underwent C1 decitabine / venetoclax with MR on BMB 01/31/2024 (pending final read) -- Final BMB read 12/24 resulted and was discussed with patient, < 5% blasts -- PLAN: - Treatment Plan: C1D44 (1/4) of 5d Decitabine/Venetoclax. Last dose of decitabine 11/26. Currently holding venetoclax due to GI bleeding (11/29) (got 8 non-consecutive doses). - TLS prophylaxis: N/A - IVF hydration: PO - Access: RIJ port - Pain Regimen: Dilaudid  PCA. - Oncology electrolyte repletion protocol  HEMATOLOGY #Pancytopenia 2/2 chemotherapy  #Dark brown stools, improving #No longer neutropenic -- Admission CBC: WBC 58.9, Hb 8.6, Plt 212 -- Blood consent signed at admission and placed in chart -- Hydrea was given until WBC <25k. Then chemo was initiated and pt became pancytopenic.  -- Today: Lab Results  Component Value Date   WBC 1.00 (L) 02/11/2024   HGB 6.2 (L)  02/11/2024   PLT 26 (LL) 02/11/2024   FIBRINOGEN  203 02/08/2024  -- PLAN: - Transfusion protocol to maintain Hb >7.0 and Plt >30K with irradiated blood  - Obtain weekly DIC panel  - Transfusions today: 1 unit platelets, 2 units pRBCs   - 1/3: got 2 units platelets    - 1/2: got 2 units pRBCs, 1 unit plts   - 12/30: 1 unit pRBCs, 2 platelet transfusions   - 12/31: 1 unit pRBCs   - 1/1: 1 unit platelets, 1 unit pRBCs   - Ongoing significant transfusion requirements  INFECTIOUS DISEASE #Recurrent fever #Appendicitis, uncomplicated #RLQ pain #E faecalis and VRE UTI, resolved #History of neutropenic fever this admission, resolved  - Urine Cx 11/19 (+) >100,000 CFU E faecalis pan-susceptible but still fevered through to 101.4 over zosyn so started vanc - Prior Ucx in 2023 were positive for E coli  - RPP (+) parainfluenza and RSV 11/29 - Ucx 12/01 E faecium VRE (susceptible to linezolid, nitrofurantoin ) -- ct sinus and ct chest were overall unremarkable 12/2 - 12/25 persistent fevers and worsening RLQ and suprapubic pain so CT AP repeated and restarted empiric zosyn and linezolid. Clinically responding well with addition of antibiotics. - 12/30: improvement day by day of abdominal pain on IV Zosyn -  1/1: repeated CT abd pelvis, which showed interval decrease in size of abdominal fluid collections, mild right hydronephrosis - 1/3: significant worsening of abdominal pain overnight with change in locations (now epigastric and whole right side), severity (increased), and quality (sharp) -- PLAN: - Antimicrobial Tx: Zosyn (12/25 afternoon--), linezolid (12/25---12/27) restarted due to concern for recurrent intra-abdominal infection. Discharged linezolid today given negative urine culture (empirically started on linezolid due to history of VRE this admission). Zosyn end date TBD depending on patient symptoms. S/p Zosyn (11/20--12/6) S/p  Linezolid (12/4--12/12) for VRE UTI S/p Vanc  (11/22--11/26; 12/1--12/4) - Antimicrobial PPX: Acyclovir  400mg  BID - Fever Plan: Continue on IV Zosyn, Tylenol , BCx >24H, (sputum and urine cx as appropriate) UCx >72h, CXR >72H - Weaned PCA continuous from 0.2 to 0.1 on 12/30, rate increased to 0.3 overnight on 1/3 due to increased pain    - 12 attempts in past 24 hours as of this morning    - Consider stopping continuous dose tomorrow pending symptoms - Consulting GI today for re-assessment    - Will consider potentially obtaining CT angio pending GI recs  OTHER #C/f acute blood loss anemia #Suspected small bowel AVM #Loose stools -- Noticed to have 2 point drop in hgb from 7 to 5.2 11/30 that was unresponsive to blood transfusions. Also began having persistent melena even after PLT were >40K. -- Hx of  Duodenal AVMs and HH seen on prior EGD -- EGD on 12/4 revealed multiple small angioectasias in duodenal bulb and first part of the duodenum that were actively bleeding, hemostatic gel placed to achieve hemostasis  -- octreotide  was started on 12/9 for AVM management and held since 12/19.  -- Has had ongoing loose stools, dark brown but not melena appearing -- PLAN: - CBC BID - qMonday DIC panel  - Platelet goal >30 - Continue transfusions as needed - PO omeprazole  daily  #Small bowel obstruction, s/p ex lap with LOA 12/11, complicated by post-OP ileus, improved -- CT AP 12/1 and 12/6 with findings c/f impending volvulus vs chronic malrotation. EGS consulted 12/6, recommended no intervention and felt findings were chronic.  -- Continued abdominal distention from 12/9 -> 12/10, continued to have bowel movements, +BS, and tolerating p.o. intake, however, developing mild abdominal pain. KUB obtained 12/10 evening which showed possible new dilation of small bowel. Subsequent CT A/P revealing small bowel obstruction with transition point in the mid lower abdomen -- EGS reconsulted on 12/11 given radiographic findings concerning for SBO, she  was decompressed with NGT and subsequently went for ex lap with LOA on 12/11 evening -- Unfortunately, patient has been experiencing persistent abdominal distention and no return of bowel function as of 12/15.  Repeat CT scan with IV and p.o. contrast demonstrating possible recurrent SBO.  She also developed bloody NGT output overnight into 12/15 -- Patient had NGT dislodged 12/16, was replaced and XR confirmed placement. Again removed 12/17 given severe pain after another dislodgement. Patient refusing placement of second NGT  -- Patient noted to have BM overnight 12/18, some mild flatus. NGT replaced afternoon of 12/18. CT abd with PO contrast  -- 12/19 CT shows small peritoneal collection in the right hemiabdomen which could be a developing abscess vs evolving hemorrhagic collection demonstrating migration of contrast in the sigmoid colon.  -- Patient reporting BM morning of 12/29 with toleration of diet. -- Continues to have frequent stools -- PLAN:  - Routine wound care - Pain management with Dilaudid  PCA pump. - Cleared for regular diet -  Aggressive electrolyte repletion for goal K > 4, Mag >2  #AKI, POA, resolved -- Patient presented with elevated creatinine of ~ 3 which slowly returned to ~0.8. CrCl remains decreased, however this is more likely related to label weight readings. Will continue to monitor. -- 12/24 Cr slowly trending up over the last couple of days. Will assess volume status and workup as needed.   #Edema, improved -- Likely iatrogenic volume overload from continuous fluids for TLS ppx  and TPN. Will continue to monitor.  #Incomplete voiding #Urinary retention -- Has a multi-year hx of incomplete voiding. Has seen urology outpt for it who only recommended double-voiding. But has had charted PVRs at 250-443mL from prior clinic visits. Will continue indwelling foley with changes q30 days or sooner PRN.  Chronic medical conditions #HTN: continue home metoprolol   equivalent #HFpEF: continue home metoprolol  #Poor appetite: nutrition consult, stop megace ; continue mirtazapine  #Chronic back and hip pain: pain reg as above  #Insomnia: home temazepam  30mg  nightly PRN #GERD: continue daily PPI  Wound 01/18/24 Incision Abdomen Medial;Upper (Active)  Date First Assessed/Time First Assessed: 01/18/24 2212   Primary Wound Type: Incision  Location: Abdomen  Wound Location Orientation: Medial;Upper    Assessments 01/18/2024 11:37 PM 02/10/2024  7:41 PM  Site Assessment -- (S) Clean;Dry;Intact  Closure Sutures;Staples --  Dressing Post-operative dressing;Other (Comment) --  Dressing Changed New --  Dressing Status Clean;Dry;Intact --     No associated orders.   Additional Diagnoses   Hospital Checklist: #DVT PPX: Contraindicated - thrombocytopenia. SCDs only #FEN: Adult Diet- Regular Ensure Clear; Twice daily with Breakfast/Lunch #Discharge Planning: #Ethics: Full Code  Portions of this note were originally documented by previous resident physicians who were involved in patient care. I have made appropriate corrections and updated the treatment plan in line with my own clinical decision making.  Electronically signed by: Gerard Almarie Fabian, MD 02/11/2024 8:42 AM    I have reviewed the history, examined the patient, and reviewed labs.  I have discussed the assessment and plan with the patient and with the team.  I agree with the resident's findings and plan of care. C1 D44 induction with decitabine and venetoclax for AML  - nadir bone morrow looked good; await blood count recovery  - discussed further treatment with patient and daughter on 1/3; if stabilizes will consider next week whole remains in hospital Chemotherapy induced pancytopenia.  - transfuse red cells and platelets Continue zosyn for appendicitis (day 11)  - pain and tenderness persists RLQ but also epigastric and more diffusely plus more acute drop in hgb    - plan GI consult  and possible CT abdomen  - no fever overnight BM site tender but not hot or inflamed on exam. Will continue to monitor  Electronically signed by: Karie LITTIE Monte, MD 02/11/2024 4:13 PM

## 2024-02-12 ENCOUNTER — Other Ambulatory Visit: Payer: Self-pay

## 2024-02-12 ENCOUNTER — Other Ambulatory Visit (HOSPITAL_COMMUNITY): Payer: Self-pay

## 2024-02-12 ENCOUNTER — Ambulatory Visit: Admitting: Internal Medicine

## 2024-02-12 ENCOUNTER — Encounter: Payer: Self-pay | Admitting: *Deleted

## 2024-02-12 NOTE — Progress Notes (Signed)
 Erroneous encounter - please disregard.

## 2024-02-19 ENCOUNTER — Other Ambulatory Visit: Payer: Self-pay | Admitting: Pharmacy Technician

## 2024-02-19 ENCOUNTER — Other Ambulatory Visit: Payer: Self-pay

## 2024-02-19 ENCOUNTER — Other Ambulatory Visit (HOSPITAL_COMMUNITY): Payer: Self-pay

## 2024-02-22 ENCOUNTER — Other Ambulatory Visit: Payer: Self-pay

## 2024-02-26 ENCOUNTER — Other Ambulatory Visit: Payer: Self-pay

## 2024-02-27 ENCOUNTER — Other Ambulatory Visit: Payer: Self-pay

## 2024-02-27 ENCOUNTER — Other Ambulatory Visit (HOSPITAL_COMMUNITY): Payer: Self-pay

## 2024-02-27 ENCOUNTER — Telehealth: Payer: Self-pay | Admitting: Pharmacy Technician

## 2024-02-27 NOTE — Telephone Encounter (Signed)
 Oral Oncology Patient Advocate Encounter   Was successful in securing patient a $2,100 grant from [employer] to provide copayment coverage for ojjaara .  The patient's out of pocket cost will be $0 monthly.    The billing information is as follows and has been shared with [facility]  Member ID: [id number] Group ID: [id number]  RxBin: [id number] Dates of Eligibility: 02/27/2024 through 02/06/2025   Barbette (Patty) Chet Burnet, CPhT  [facility], Zelda Salmon, [facility] - Oral Chemotherapy Patient Advocate Specialist III Phone: [phone]  Fax: [phone]

## 2024-02-28 ENCOUNTER — Other Ambulatory Visit: Payer: Self-pay

## 2024-02-29 ENCOUNTER — Other Ambulatory Visit: Payer: Self-pay

## 2024-02-29 NOTE — Progress Notes (Signed)
" °   02/29/24 1201  Reason Eval/Treat Not Completed  Reason Eval/Treat Not Completed Not medically appropriate (pt transferred to MICU-O 02/28/24; PT orders cancelled, RN notified)   Dale Pan, PT "

## 2024-03-01 ENCOUNTER — Other Ambulatory Visit: Payer: Self-pay

## 2024-03-01 ENCOUNTER — Ambulatory Visit: Admitting: Family Medicine

## 2024-03-02 ENCOUNTER — Encounter: Payer: Self-pay | Admitting: Family Medicine

## 2024-03-04 ENCOUNTER — Other Ambulatory Visit: Payer: Self-pay | Admitting: Pharmacy Technician

## 2024-03-07 ENCOUNTER — Telehealth: Payer: Self-pay

## 2024-03-07 ENCOUNTER — Telehealth: Payer: Self-pay | Admitting: Family Medicine

## 2024-03-07 ENCOUNTER — Other Ambulatory Visit: Payer: Self-pay

## 2024-03-07 NOTE — Telephone Encounter (Signed)
 Telephone contact made with Daughter, Lawerance , to accept and pass on to Renee Waters and other family members my concern regarding her condition Unable to speak with Mr Zachar and unable to leave a message

## 2024-03-07 NOTE — Telephone Encounter (Signed)
 Pt's husband called stating that the pt has been inpatient at [facility] since 12/26/23 with her leukemia. Pt is now having internal bleeding and they are unable to locate the source of the bleed. Pt is requesting that you call him. He stated that he would like to talk to you and ask you some questions regarding his wife's condition.

## 2024-03-12 ENCOUNTER — Other Ambulatory Visit: Payer: Self-pay

## 2024-03-12 NOTE — Telephone Encounter (Signed)
 Communication noted.

## 2024-03-14 ENCOUNTER — Other Ambulatory Visit: Payer: Self-pay
